# Patient Record
Sex: Female | Born: 1965 | Race: White | Hispanic: No | Smoking: Former smoker
Health system: Southern US, Community
[De-identification: ages and names within clinical notes are randomized; demographics above are authoritative.]

## PROBLEM LIST (undated history)

## (undated) DIAGNOSIS — I2510 Atherosclerotic heart disease of native coronary artery without angina pectoris: Secondary | ICD-10-CM

## (undated) DIAGNOSIS — Z9289 Personal history of other medical treatment: Secondary | ICD-10-CM

## (undated) DIAGNOSIS — E119 Type 2 diabetes mellitus without complications: Secondary | ICD-10-CM

## (undated) DIAGNOSIS — M545 Low back pain, unspecified: Secondary | ICD-10-CM

## (undated) DIAGNOSIS — N289 Disorder of kidney and ureter, unspecified: Secondary | ICD-10-CM

## (undated) DIAGNOSIS — F329 Major depressive disorder, single episode, unspecified: Secondary | ICD-10-CM

## (undated) DIAGNOSIS — E785 Hyperlipidemia, unspecified: Secondary | ICD-10-CM

## (undated) DIAGNOSIS — G8929 Other chronic pain: Secondary | ICD-10-CM

## (undated) DIAGNOSIS — I639 Cerebral infarction, unspecified: Secondary | ICD-10-CM

## (undated) DIAGNOSIS — F319 Bipolar disorder, unspecified: Secondary | ICD-10-CM

## (undated) DIAGNOSIS — F32A Depression, unspecified: Secondary | ICD-10-CM

## (undated) DIAGNOSIS — K3184 Gastroparesis: Secondary | ICD-10-CM

## (undated) DIAGNOSIS — N809 Endometriosis, unspecified: Secondary | ICD-10-CM

## (undated) DIAGNOSIS — Z992 Dependence on renal dialysis: Secondary | ICD-10-CM

## (undated) DIAGNOSIS — R569 Unspecified convulsions: Secondary | ICD-10-CM

## (undated) DIAGNOSIS — I209 Angina pectoris, unspecified: Secondary | ICD-10-CM

## (undated) DIAGNOSIS — I509 Heart failure, unspecified: Secondary | ICD-10-CM

## (undated) DIAGNOSIS — Z8719 Personal history of other diseases of the digestive system: Secondary | ICD-10-CM

## (undated) DIAGNOSIS — I219 Acute myocardial infarction, unspecified: Secondary | ICD-10-CM

## (undated) DIAGNOSIS — I10 Essential (primary) hypertension: Secondary | ICD-10-CM

## (undated) DIAGNOSIS — G43909 Migraine, unspecified, not intractable, without status migrainosus: Secondary | ICD-10-CM

## (undated) DIAGNOSIS — F419 Anxiety disorder, unspecified: Secondary | ICD-10-CM

## (undated) DIAGNOSIS — N186 End stage renal disease: Secondary | ICD-10-CM

## (undated) DIAGNOSIS — K219 Gastro-esophageal reflux disease without esophagitis: Secondary | ICD-10-CM

## (undated) HISTORY — PX: CORONARY ANGIOPLASTY WITH STENT PLACEMENT: SHX49

## (undated) HISTORY — PX: PERITONEAL CATHETER REMOVAL: SUR1106

## (undated) HISTORY — PX: CATARACT EXTRACTION, BILATERAL: SHX1313

## (undated) HISTORY — PX: BELOW KNEE LEG AMPUTATION: SUR23

## (undated) HISTORY — PX: ABDOMINAL HYSTERECTOMY: SHX81

## (undated) HISTORY — PX: HYSTEROTOMY: SHX1776

## (undated) HISTORY — PX: TUBAL LIGATION: SHX77

## (undated) HISTORY — PX: SALPINGOOPHORECTOMY: SHX82

## (undated) HISTORY — DX: Acute myocardial infarction, unspecified: I21.9

---

## 2003-03-23 ENCOUNTER — Other Ambulatory Visit: Payer: Self-pay

## 2004-05-22 ENCOUNTER — Emergency Department: Payer: Self-pay | Admitting: Emergency Medicine

## 2004-05-23 ENCOUNTER — Other Ambulatory Visit: Payer: Self-pay

## 2004-06-15 ENCOUNTER — Ambulatory Visit: Payer: Self-pay

## 2004-08-27 ENCOUNTER — Emergency Department: Payer: Self-pay | Admitting: Emergency Medicine

## 2005-01-09 ENCOUNTER — Emergency Department: Payer: Self-pay | Admitting: Emergency Medicine

## 2005-04-25 ENCOUNTER — Ambulatory Visit: Payer: Self-pay

## 2005-07-18 ENCOUNTER — Emergency Department: Payer: Self-pay | Admitting: Emergency Medicine

## 2005-10-28 ENCOUNTER — Encounter: Payer: Self-pay | Admitting: *Deleted

## 2005-10-30 ENCOUNTER — Ambulatory Visit: Payer: Self-pay | Admitting: Pediatrics

## 2005-10-30 ENCOUNTER — Encounter: Payer: Self-pay | Admitting: *Deleted

## 2005-11-30 ENCOUNTER — Encounter: Payer: Self-pay | Admitting: *Deleted

## 2006-06-11 ENCOUNTER — Other Ambulatory Visit: Payer: Self-pay

## 2006-06-11 ENCOUNTER — Inpatient Hospital Stay: Payer: Self-pay | Admitting: Internal Medicine

## 2007-01-19 ENCOUNTER — Emergency Department: Payer: Self-pay | Admitting: Internal Medicine

## 2009-07-25 ENCOUNTER — Inpatient Hospital Stay: Payer: Self-pay | Admitting: *Deleted

## 2009-09-19 ENCOUNTER — Ambulatory Visit: Payer: Self-pay | Admitting: Family Medicine

## 2009-09-27 ENCOUNTER — Emergency Department: Payer: Self-pay | Admitting: Internal Medicine

## 2009-12-31 ENCOUNTER — Emergency Department: Payer: Self-pay | Admitting: Emergency Medicine

## 2013-06-02 ENCOUNTER — Emergency Department: Payer: Self-pay | Admitting: Emergency Medicine

## 2013-06-02 LAB — COMPREHENSIVE METABOLIC PANEL
ALBUMIN: 2.4 g/dL — AB (ref 3.4–5.0)
Alkaline Phosphatase: 133 U/L — ABNORMAL HIGH
Anion Gap: 9 (ref 7–16)
BILIRUBIN TOTAL: 0.3 mg/dL (ref 0.2–1.0)
BUN: 25 mg/dL — ABNORMAL HIGH (ref 7–18)
Calcium, Total: 8.6 mg/dL (ref 8.5–10.1)
Chloride: 100 mmol/L (ref 98–107)
Co2: 25 mmol/L (ref 21–32)
Creatinine: 2.3 mg/dL — ABNORMAL HIGH (ref 0.60–1.30)
EGFR (Non-African Amer.): 24 — ABNORMAL LOW
GFR CALC AF AMER: 28 — AB
Glucose: 585 mg/dL (ref 65–99)
Osmolality: 300 (ref 275–301)
POTASSIUM: 4.2 mmol/L (ref 3.5–5.1)
SGOT(AST): 19 U/L (ref 15–37)
SGPT (ALT): 17 U/L (ref 12–78)
SODIUM: 134 mmol/L — AB (ref 136–145)
TOTAL PROTEIN: 6.8 g/dL (ref 6.4–8.2)

## 2013-06-02 LAB — CBC WITH DIFFERENTIAL/PLATELET
BASOS ABS: 0.1 10*3/uL (ref 0.0–0.1)
BASOS PCT: 0.8 %
EOS ABS: 0 10*3/uL (ref 0.0–0.7)
Eosinophil %: 0.1 %
HCT: 41 % (ref 35.0–47.0)
HGB: 13.6 g/dL (ref 12.0–16.0)
Lymphocyte #: 1.1 10*3/uL (ref 1.0–3.6)
Lymphocyte %: 14 %
MCH: 27.6 pg (ref 26.0–34.0)
MCHC: 33.2 g/dL (ref 32.0–36.0)
MCV: 83 fL (ref 80–100)
MONO ABS: 0.1 x10 3/mm — AB (ref 0.2–0.9)
MONOS PCT: 2 %
NEUTROS ABS: 6.3 10*3/uL (ref 1.4–6.5)
Neutrophil %: 83.1 %
Platelet: 332 10*3/uL (ref 150–440)
RBC: 4.94 10*6/uL (ref 3.80–5.20)
RDW: 13.8 % (ref 11.5–14.5)
WBC: 7.5 10*3/uL (ref 3.6–11.0)

## 2013-06-02 LAB — TROPONIN I: Troponin-I: <0.02 ng/mL

## 2013-06-02 LAB — LIPASE, BLOOD: Lipase: 102 U/L (ref 73–393)

## 2013-07-03 ENCOUNTER — Inpatient Hospital Stay: Payer: Self-pay | Admitting: Internal Medicine

## 2013-07-03 DIAGNOSIS — N179 Acute kidney failure, unspecified: Secondary | ICD-10-CM

## 2013-07-03 DIAGNOSIS — I5033 Acute on chronic diastolic (congestive) heart failure: Secondary | ICD-10-CM

## 2013-07-03 DIAGNOSIS — N189 Chronic kidney disease, unspecified: Secondary | ICD-10-CM

## 2013-07-03 DIAGNOSIS — I519 Heart disease, unspecified: Secondary | ICD-10-CM

## 2013-07-03 LAB — CBC
HCT: 26 % — ABNORMAL LOW (ref 35.0–47.0)
HGB: 8.7 g/dL — AB (ref 12.0–16.0)
MCH: 28.3 pg (ref 26.0–34.0)
MCHC: 33.3 g/dL (ref 32.0–36.0)
MCV: 85 fL (ref 80–100)
PLATELETS: 287 10*3/uL (ref 150–440)
RBC: 3.06 10*6/uL — ABNORMAL LOW (ref 3.80–5.20)
RDW: 13.8 % (ref 11.5–14.5)
WBC: 10.9 10*3/uL (ref 3.6–11.0)

## 2013-07-03 LAB — BASIC METABOLIC PANEL
Anion Gap: 7 (ref 7–16)
BUN: 51 mg/dL — ABNORMAL HIGH (ref 7–18)
CHLORIDE: 104 mmol/L (ref 98–107)
CO2: 24 mmol/L (ref 21–32)
Calcium, Total: 7.3 mg/dL — ABNORMAL LOW (ref 8.5–10.1)
Creatinine: 3.75 mg/dL — ABNORMAL HIGH (ref 0.60–1.30)
EGFR (African American): 15 — ABNORMAL LOW
EGFR (Non-African Amer.): 13 — ABNORMAL LOW
GLUCOSE: 342 mg/dL — AB (ref 65–99)
OSMOLALITY: 297 (ref 275–301)
POTASSIUM: 4.6 mmol/L (ref 3.5–5.1)
Sodium: 135 mmol/L — ABNORMAL LOW (ref 136–145)

## 2013-07-03 LAB — TROPONIN I
Troponin-I: 0.02 ng/mL
Troponin-I: 0.04 ng/mL

## 2013-07-03 LAB — CK TOTAL AND CKMB (NOT AT ARMC)
CK, TOTAL: 256 U/L — AB
CK, Total: 262 U/L — ABNORMAL HIGH
CK-MB: 1.4 ng/mL (ref 0.5–3.6)
CK-MB: 1.4 ng/mL (ref 0.5–3.6)

## 2013-07-03 LAB — HEMOGLOBIN A1C: HEMOGLOBIN A1C: 11.7 % — AB (ref 4.2–6.3)

## 2013-07-03 LAB — CK-MB: CK-MB: 1.2 ng/mL (ref 0.5–3.6)

## 2013-07-03 LAB — D-DIMER(ARMC): D-Dimer: 594 ng/ml

## 2013-07-03 LAB — PRO B NATRIURETIC PEPTIDE: B-TYPE NATIURETIC PEPTID: 3510 pg/mL — AB (ref 0–125)

## 2013-07-04 DIAGNOSIS — I2510 Atherosclerotic heart disease of native coronary artery without angina pectoris: Secondary | ICD-10-CM

## 2013-07-04 LAB — BASIC METABOLIC PANEL
Anion Gap: 8 (ref 7–16)
BUN: 52 mg/dL — ABNORMAL HIGH (ref 7–18)
Calcium, Total: 7.5 mg/dL — ABNORMAL LOW (ref 8.5–10.1)
Chloride: 102 mmol/L (ref 98–107)
Co2: 25 mmol/L (ref 21–32)
Creatinine: 3.94 mg/dL — ABNORMAL HIGH (ref 0.60–1.30)
GFR CALC AF AMER: 15 — AB
GFR CALC NON AF AMER: 13 — AB
Glucose: 72 mg/dL (ref 65–99)
Osmolality: 283 (ref 275–301)
Potassium: 3.6 mmol/L (ref 3.5–5.1)
Sodium: 135 mmol/L — ABNORMAL LOW (ref 136–145)

## 2013-07-05 LAB — BASIC METABOLIC PANEL
Anion Gap: 6 — ABNORMAL LOW (ref 7–16)
BUN: 65 mg/dL — AB (ref 7–18)
CALCIUM: 7.6 mg/dL — AB (ref 8.5–10.1)
Chloride: 99 mmol/L (ref 98–107)
Co2: 26 mmol/L (ref 21–32)
Creatinine: 4.05 mg/dL — ABNORMAL HIGH (ref 0.60–1.30)
EGFR (African American): 14 — ABNORMAL LOW
GFR CALC NON AF AMER: 12 — AB
GLUCOSE: 148 mg/dL — AB (ref 65–99)
OSMOLALITY: 284 (ref 275–301)
Potassium: 3.9 mmol/L (ref 3.5–5.1)
Sodium: 131 mmol/L — ABNORMAL LOW (ref 136–145)

## 2013-07-05 LAB — HCG, QUANTITATIVE, PREGNANCY: Beta Hcg, Quant.: <1 m[IU]/mL — ABNORMAL LOW

## 2013-07-06 DIAGNOSIS — R079 Chest pain, unspecified: Secondary | ICD-10-CM

## 2013-07-06 DIAGNOSIS — I5033 Acute on chronic diastolic (congestive) heart failure: Secondary | ICD-10-CM

## 2013-07-06 LAB — BASIC METABOLIC PANEL
Anion Gap: 10 (ref 7–16)
BUN: 73 mg/dL — ABNORMAL HIGH (ref 7–18)
CALCIUM: 7.5 mg/dL — AB (ref 8.5–10.1)
CREATININE: 4.53 mg/dL — AB (ref 0.60–1.30)
Chloride: 97 mmol/L — ABNORMAL LOW (ref 98–107)
Co2: 23 mmol/L (ref 21–32)
EGFR (Non-African Amer.): 11 — ABNORMAL LOW
GFR CALC AF AMER: 12 — AB
Glucose: 188 mg/dL — ABNORMAL HIGH (ref 65–99)
Osmolality: 287 (ref 275–301)
Potassium: 4.1 mmol/L (ref 3.5–5.1)
Sodium: 130 mmol/L — ABNORMAL LOW (ref 136–145)

## 2013-07-07 LAB — BASIC METABOLIC PANEL
Anion Gap: 6 — ABNORMAL LOW (ref 7–16)
BUN: 64 mg/dL — ABNORMAL HIGH (ref 7–18)
CALCIUM: 8 mg/dL — AB (ref 8.5–10.1)
CHLORIDE: 98 mmol/L (ref 98–107)
CO2: 27 mmol/L (ref 21–32)
CREATININE: 3.81 mg/dL — AB (ref 0.60–1.30)
GFR CALC AF AMER: 15 — AB
GFR CALC NON AF AMER: 13 — AB
GLUCOSE: 145 mg/dL — AB (ref 65–99)
Osmolality: 284 (ref 275–301)
Potassium: 4.1 mmol/L (ref 3.5–5.1)
Sodium: 131 mmol/L — ABNORMAL LOW (ref 136–145)

## 2013-07-07 LAB — IRON AND TIBC
IRON BIND. CAP.(TOTAL): 203 ug/dL — AB (ref 250–450)
IRON SATURATION: 15 %
Iron: 30 ug/dL — ABNORMAL LOW (ref 50–170)
Unbound Iron-Bind.Cap.: 173 ug/dL

## 2013-07-07 LAB — PLATELET COUNT: Platelet: 355 10*3/uL (ref 150–440)

## 2013-07-07 LAB — PHOSPHORUS: PHOSPHORUS: 6.4 mg/dL — AB (ref 2.5–4.9)

## 2013-07-08 DIAGNOSIS — I5033 Acute on chronic diastolic (congestive) heart failure: Secondary | ICD-10-CM

## 2013-07-08 LAB — BASIC METABOLIC PANEL
ANION GAP: 5 — AB (ref 7–16)
BUN: 51 mg/dL — AB (ref 7–18)
CHLORIDE: 99 mmol/L (ref 98–107)
CO2: 30 mmol/L (ref 21–32)
Calcium, Total: 7.7 mg/dL — ABNORMAL LOW (ref 8.5–10.1)
Creatinine: 3.34 mg/dL — ABNORMAL HIGH (ref 0.60–1.30)
EGFR (African American): 18 — ABNORMAL LOW
EGFR (Non-African Amer.): 15 — ABNORMAL LOW
GLUCOSE: 158 mg/dL — AB (ref 65–99)
Osmolality: 285 (ref 275–301)
POTASSIUM: 3.9 mmol/L (ref 3.5–5.1)
Sodium: 134 mmol/L — ABNORMAL LOW (ref 136–145)

## 2013-07-09 LAB — RENAL FUNCTION PANEL
Albumin: 1.9 g/dL — ABNORMAL LOW (ref 3.4–5.0)
Anion Gap: 6 — ABNORMAL LOW (ref 7–16)
BUN: 58 mg/dL — AB (ref 7–18)
CALCIUM: 7.8 mg/dL — AB (ref 8.5–10.1)
Chloride: 98 mmol/L (ref 98–107)
Co2: 28 mmol/L (ref 21–32)
Creatinine: 3.42 mg/dL — ABNORMAL HIGH (ref 0.60–1.30)
EGFR (African American): 17 — ABNORMAL LOW
EGFR (Non-African Amer.): 15 — ABNORMAL LOW
Glucose: 177 mg/dL — ABNORMAL HIGH (ref 65–99)
OSMOLALITY: 285 (ref 275–301)
Phosphorus: 5.9 mg/dL — ABNORMAL HIGH (ref 2.5–4.9)
Potassium: 4.3 mmol/L (ref 3.5–5.1)
SODIUM: 132 mmol/L — AB (ref 136–145)

## 2013-07-09 LAB — IRON AND TIBC
Iron Bind.Cap.(Total): 213 ug/dL — ABNORMAL LOW (ref 250–450)
Iron Saturation: 14 %
Iron: 30 ug/dL — ABNORMAL LOW (ref 50–170)
Unbound Iron-Bind.Cap.: 183 ug/dL

## 2013-07-09 LAB — HEMOGLOBIN: HGB: 7.4 g/dL — ABNORMAL LOW (ref 12.0–16.0)

## 2013-07-09 LAB — FERRITIN: Ferritin (ARMC): 259 ng/mL (ref 8–388)

## 2013-07-10 LAB — CBC WITH DIFFERENTIAL/PLATELET
BASOS ABS: 0.1 10*3/uL (ref 0.0–0.1)
Basophil %: 0.7 %
EOS PCT: 4 %
Eosinophil #: 0.4 10*3/uL (ref 0.0–0.7)
HCT: 22.2 % — ABNORMAL LOW (ref 35.0–47.0)
HGB: 7.3 g/dL — ABNORMAL LOW (ref 12.0–16.0)
Lymphocyte #: 1.7 10*3/uL (ref 1.0–3.6)
Lymphocyte %: 17.6 %
MCH: 27.6 pg (ref 26.0–34.0)
MCHC: 33.1 g/dL (ref 32.0–36.0)
MCV: 83 fL (ref 80–100)
MONO ABS: 0.8 x10 3/mm (ref 0.2–0.9)
MONOS PCT: 7.9 %
Neutrophil #: 6.8 10*3/uL — ABNORMAL HIGH (ref 1.4–6.5)
Neutrophil %: 69.8 %
Platelet: 462 10*3/uL — ABNORMAL HIGH (ref 150–440)
RBC: 2.66 10*6/uL — AB (ref 3.80–5.20)
RDW: 13.4 % (ref 11.5–14.5)
WBC: 9.7 10*3/uL (ref 3.6–11.0)

## 2013-07-10 LAB — BASIC METABOLIC PANEL
Anion Gap: 6 — ABNORMAL LOW (ref 7–16)
BUN: 67 mg/dL — ABNORMAL HIGH (ref 7–18)
CALCIUM: 8.6 mg/dL (ref 8.5–10.1)
CHLORIDE: 95 mmol/L — AB (ref 98–107)
CREATININE: 3.59 mg/dL — AB (ref 0.60–1.30)
Co2: 29 mmol/L (ref 21–32)
EGFR (Non-African Amer.): 14 — ABNORMAL LOW
GFR CALC AF AMER: 16 — AB
GLUCOSE: 74 mg/dL (ref 65–99)
Osmolality: 279 (ref 275–301)
Potassium: 3.9 mmol/L (ref 3.5–5.1)
Sodium: 130 mmol/L — ABNORMAL LOW (ref 136–145)

## 2013-07-11 LAB — BASIC METABOLIC PANEL
Anion Gap: 9 (ref 7–16)
BUN: 71 mg/dL — AB (ref 7–18)
CREATININE: 3.6 mg/dL — AB (ref 0.60–1.30)
Calcium, Total: 8 mg/dL — ABNORMAL LOW (ref 8.5–10.1)
Chloride: 95 mmol/L — ABNORMAL LOW (ref 98–107)
Co2: 27 mmol/L (ref 21–32)
EGFR (African American): 16 — ABNORMAL LOW
GFR CALC NON AF AMER: 14 — AB
Glucose: 199 mg/dL — ABNORMAL HIGH (ref 65–99)
Osmolality: 289 (ref 275–301)
POTASSIUM: 4.4 mmol/L (ref 3.5–5.1)
Sodium: 131 mmol/L — ABNORMAL LOW (ref 136–145)

## 2013-07-11 LAB — CBC WITH DIFFERENTIAL/PLATELET
Basophil #: 0.1 10*3/uL (ref 0.0–0.1)
Basophil %: 0.8 %
Eosinophil #: 0.3 10*3/uL (ref 0.0–0.7)
Eosinophil %: 3.2 %
HCT: 21.5 % — AB (ref 35.0–47.0)
HGB: 6.5 g/dL — ABNORMAL LOW (ref 12.0–16.0)
LYMPHS PCT: 17.5 %
Lymphocyte #: 1.7 10*3/uL (ref 1.0–3.6)
MCH: 25.6 pg — ABNORMAL LOW (ref 26.0–34.0)
MCHC: 30.2 g/dL — ABNORMAL LOW (ref 32.0–36.0)
MCV: 85 fL (ref 80–100)
MONOS PCT: 8 %
Monocyte #: 0.8 x10 3/mm (ref 0.2–0.9)
Neutrophil #: 6.7 10*3/uL — ABNORMAL HIGH (ref 1.4–6.5)
Neutrophil %: 70.5 %
PLATELETS: 422 10*3/uL (ref 150–440)
RBC: 2.54 10*6/uL — ABNORMAL LOW (ref 3.80–5.20)
RDW: 13.7 % (ref 11.5–14.5)
WBC: 9.4 10*3/uL (ref 3.6–11.0)

## 2013-07-12 LAB — CBC WITH DIFFERENTIAL/PLATELET
Basophil #: 0.1 10*3/uL (ref 0.0–0.1)
Basophil %: 0.7 %
Eosinophil #: 0.3 10*3/uL (ref 0.0–0.7)
Eosinophil %: 3.1 %
HCT: 19.6 % — ABNORMAL LOW (ref 35.0–47.0)
HGB: 6.6 g/dL — ABNORMAL LOW (ref 12.0–16.0)
LYMPHS PCT: 14.7 %
Lymphocyte #: 1.5 10*3/uL (ref 1.0–3.6)
MCH: 28 pg (ref 26.0–34.0)
MCHC: 33.7 g/dL (ref 32.0–36.0)
MCV: 83 fL (ref 80–100)
Monocyte #: 1 x10 3/mm — ABNORMAL HIGH (ref 0.2–0.9)
Monocyte %: 9.6 %
Neutrophil #: 7.3 10*3/uL — ABNORMAL HIGH (ref 1.4–6.5)
Neutrophil %: 71.9 %
PLATELETS: 418 10*3/uL (ref 150–440)
RBC: 2.36 10*6/uL — AB (ref 3.80–5.20)
RDW: 13.4 % (ref 11.5–14.5)
WBC: 10.2 10*3/uL (ref 3.6–11.0)

## 2013-07-12 LAB — BASIC METABOLIC PANEL
ANION GAP: 6 — AB (ref 7–16)
BUN: 80 mg/dL — ABNORMAL HIGH (ref 7–18)
CHLORIDE: 94 mmol/L — AB (ref 98–107)
Calcium, Total: 8.1 mg/dL — ABNORMAL LOW (ref 8.5–10.1)
Co2: 29 mmol/L (ref 21–32)
Creatinine: 3.83 mg/dL — ABNORMAL HIGH (ref 0.60–1.30)
GFR CALC AF AMER: 15 — AB
GFR CALC NON AF AMER: 13 — AB
GLUCOSE: 149 mg/dL — AB (ref 65–99)
Osmolality: 286 (ref 275–301)
POTASSIUM: 4.2 mmol/L (ref 3.5–5.1)
Sodium: 129 mmol/L — ABNORMAL LOW (ref 136–145)

## 2013-07-12 LAB — FERRITIN: Ferritin (ARMC): 183 ng/mL (ref 8–388)

## 2013-07-12 LAB — IRON AND TIBC
IRON SATURATION: 11 %
Iron Bind.Cap.(Total): 214 ug/dL — ABNORMAL LOW (ref 250–450)
Iron: 24 ug/dL — ABNORMAL LOW (ref 50–170)
Unbound Iron-Bind.Cap.: 190 ug/dL

## 2013-07-12 LAB — OCCULT BLOOD X 1 CARD TO LAB, STOOL: Occult Blood, Feces: NEGATIVE

## 2013-07-13 LAB — CBC WITH DIFFERENTIAL/PLATELET
BASOS ABS: 0.1 10*3/uL (ref 0.0–0.1)
BASOS PCT: 0.4 %
Eosinophil #: 0.3 10*3/uL (ref 0.0–0.7)
Eosinophil %: 3 %
HCT: 23.2 % — ABNORMAL LOW (ref 35.0–47.0)
HGB: 7.6 g/dL — ABNORMAL LOW (ref 12.0–16.0)
Lymphocyte #: 1.7 10*3/uL (ref 1.0–3.6)
Lymphocyte %: 14.6 %
MCH: 27.2 pg (ref 26.0–34.0)
MCHC: 32.6 g/dL (ref 32.0–36.0)
MCV: 83 fL (ref 80–100)
MONOS PCT: 8.5 %
Monocyte #: 1 x10 3/mm — ABNORMAL HIGH (ref 0.2–0.9)
NEUTROS PCT: 73.5 %
Neutrophil #: 8.6 10*3/uL — ABNORMAL HIGH (ref 1.4–6.5)
PLATELETS: 437 10*3/uL (ref 150–440)
RBC: 2.79 10*6/uL — AB (ref 3.80–5.20)
RDW: 13.8 % (ref 11.5–14.5)
WBC: 11.7 10*3/uL — ABNORMAL HIGH (ref 3.6–11.0)

## 2013-07-13 LAB — RENAL FUNCTION PANEL
ANION GAP: 9 (ref 7–16)
Albumin: 2.5 g/dL — ABNORMAL LOW (ref 3.4–5.0)
BUN: 87 mg/dL — AB (ref 7–18)
CREATININE: 4.27 mg/dL — AB (ref 0.60–1.30)
Calcium, Total: 8.1 mg/dL — ABNORMAL LOW (ref 8.5–10.1)
Chloride: 93 mmol/L — ABNORMAL LOW (ref 98–107)
Co2: 28 mmol/L (ref 21–32)
EGFR (African American): 13 — ABNORMAL LOW
GFR CALC NON AF AMER: 11 — AB
GLUCOSE: 128 mg/dL — AB (ref 65–99)
OSMOLALITY: 289 (ref 275–301)
POTASSIUM: 4 mmol/L (ref 3.5–5.1)
Phosphorus: 7 mg/dL — ABNORMAL HIGH (ref 2.5–4.9)
Sodium: 130 mmol/L — ABNORMAL LOW (ref 136–145)

## 2013-07-14 LAB — CBC WITH DIFFERENTIAL/PLATELET
Basophil #: 0.1 10*3/uL (ref 0.0–0.1)
Basophil %: 0.7 %
EOS PCT: 3.4 %
Eosinophil #: 0.4 10*3/uL (ref 0.0–0.7)
HCT: 22.6 % — ABNORMAL LOW (ref 35.0–47.0)
HGB: 7.8 g/dL — ABNORMAL LOW (ref 12.0–16.0)
LYMPHS PCT: 16.7 %
Lymphocyte #: 1.8 10*3/uL (ref 1.0–3.6)
MCH: 28.2 pg (ref 26.0–34.0)
MCHC: 34.3 g/dL (ref 32.0–36.0)
MCV: 82 fL (ref 80–100)
Monocyte #: 0.7 x10 3/mm (ref 0.2–0.9)
Monocyte %: 6.4 %
NEUTROS ABS: 7.7 10*3/uL — AB (ref 1.4–6.5)
NEUTROS PCT: 72.8 %
PLATELETS: 419 10*3/uL (ref 150–440)
RBC: 2.75 10*6/uL — ABNORMAL LOW (ref 3.80–5.20)
RDW: 13.7 % (ref 11.5–14.5)
WBC: 10.6 10*3/uL (ref 3.6–11.0)

## 2013-07-14 LAB — BASIC METABOLIC PANEL
Anion Gap: 8 (ref 7–16)
BUN: 95 mg/dL — AB (ref 7–18)
CREATININE: 4.4 mg/dL — AB (ref 0.60–1.30)
Calcium, Total: 8.3 mg/dL — ABNORMAL LOW (ref 8.5–10.1)
Chloride: 92 mmol/L — ABNORMAL LOW (ref 98–107)
Co2: 31 mmol/L (ref 21–32)
EGFR (African American): 13 — ABNORMAL LOW
GFR CALC NON AF AMER: 11 — AB
Glucose: 173 mg/dL — ABNORMAL HIGH (ref 65–99)
OSMOLALITY: 296 (ref 275–301)
Potassium: 3.9 mmol/L (ref 3.5–5.1)
Sodium: 131 mmol/L — ABNORMAL LOW (ref 136–145)

## 2013-07-14 LAB — PHOSPHORUS: PHOSPHORUS: 6.4 mg/dL — AB (ref 2.5–4.9)

## 2013-07-15 DIAGNOSIS — I5033 Acute on chronic diastolic (congestive) heart failure: Secondary | ICD-10-CM

## 2013-07-15 LAB — RENAL FUNCTION PANEL
ALBUMIN: 2.2 g/dL — AB (ref 3.4–5.0)
Anion Gap: 6 — ABNORMAL LOW (ref 7–16)
BUN: 76 mg/dL — ABNORMAL HIGH (ref 7–18)
CALCIUM: 8.3 mg/dL — AB (ref 8.5–10.1)
CO2: 33 mmol/L — AB (ref 21–32)
CREATININE: 3.43 mg/dL — AB (ref 0.60–1.30)
Chloride: 94 mmol/L — ABNORMAL LOW (ref 98–107)
EGFR (Non-African Amer.): 15 — ABNORMAL LOW
GFR CALC AF AMER: 17 — AB
Glucose: 202 mg/dL — ABNORMAL HIGH (ref 65–99)
OSMOLALITY: 295 (ref 275–301)
PHOSPHORUS: 5.2 mg/dL — AB (ref 2.5–4.9)
POTASSIUM: 3.4 mmol/L — AB (ref 3.5–5.1)
Sodium: 133 mmol/L — ABNORMAL LOW (ref 136–145)

## 2013-07-15 LAB — PHOSPHORUS: Phosphorus: 5.4 mg/dL — ABNORMAL HIGH (ref 2.5–4.9)

## 2013-07-15 LAB — HEMOGLOBIN: HGB: 7.5 g/dL — ABNORMAL LOW (ref 12.0–16.0)

## 2013-07-16 ENCOUNTER — Encounter: Payer: Self-pay | Admitting: Internal Medicine

## 2013-07-16 LAB — HEMOGLOBIN: HGB: 7.3 g/dL — ABNORMAL LOW (ref 12.0–16.0)

## 2013-07-16 LAB — PHOSPHORUS: Phosphorus: 3.3 mg/dL (ref 2.5–4.9)

## 2013-07-17 LAB — PHOSPHORUS: Phosphorus: 3.3 mg/dL (ref 2.5–4.9)

## 2013-07-20 LAB — HEMOGLOBIN A1C: Hemoglobin A1C: 9.1 % — ABNORMAL HIGH (ref 4.2–6.3)

## 2013-07-30 ENCOUNTER — Encounter: Payer: Self-pay | Admitting: Internal Medicine

## 2013-10-05 ENCOUNTER — Emergency Department: Payer: Self-pay | Admitting: Emergency Medicine

## 2013-10-05 LAB — URINALYSIS, COMPLETE
Bilirubin,UR: NEGATIVE
Blood: NEGATIVE
Leukocyte Esterase: NEGATIVE
Nitrite: NEGATIVE
Ph: 7 (ref 4.5–8.0)
Protein: >=500
RBC,UR: 3 /HPF (ref 0–5)
SPECIFIC GRAVITY: 1.02 (ref 1.003–1.030)
Squamous Epithelial: 10
WBC UR: 8 /HPF (ref 0–5)

## 2013-10-05 LAB — COMPREHENSIVE METABOLIC PANEL
ALT: 14 U/L (ref 12–78)
AST: 24 U/L (ref 15–37)
Albumin: 2.2 g/dL — ABNORMAL LOW (ref 3.4–5.0)
Alkaline Phosphatase: 150 U/L — ABNORMAL HIGH
Anion Gap: 10 (ref 7–16)
BILIRUBIN TOTAL: 0.3 mg/dL (ref 0.2–1.0)
BUN: 30 mg/dL — ABNORMAL HIGH (ref 7–18)
CALCIUM: 9.3 mg/dL (ref 8.5–10.1)
CREATININE: 2.9 mg/dL — AB (ref 0.60–1.30)
Chloride: 102 mmol/L (ref 98–107)
Co2: 24 mmol/L (ref 21–32)
EGFR (African American): 21 — ABNORMAL LOW
EGFR (Non-African Amer.): 18 — ABNORMAL LOW
GLUCOSE: 418 mg/dL — AB (ref 65–99)
Osmolality: 296 (ref 275–301)
POTASSIUM: 4.2 mmol/L (ref 3.5–5.1)
Sodium: 136 mmol/L (ref 136–145)
TOTAL PROTEIN: 7.4 g/dL (ref 6.4–8.2)

## 2013-10-05 LAB — CBC WITH DIFFERENTIAL/PLATELET
Basophil #: 0.1 10*3/uL (ref 0.0–0.1)
Basophil %: 0.7 %
EOS PCT: 0.8 %
Eosinophil #: 0.1 10*3/uL (ref 0.0–0.7)
HCT: 41.7 % (ref 35.0–47.0)
HGB: 13.8 g/dL (ref 12.0–16.0)
LYMPHS PCT: 9.6 %
Lymphocyte #: 0.9 10*3/uL — ABNORMAL LOW (ref 1.0–3.6)
MCH: 27.2 pg (ref 26.0–34.0)
MCHC: 33.2 g/dL (ref 32.0–36.0)
MCV: 82 fL (ref 80–100)
Monocyte #: 0.2 x10 3/mm (ref 0.2–0.9)
Monocyte %: 2.6 %
NEUTROS PCT: 86.3 %
Neutrophil #: 8.1 10*3/uL — ABNORMAL HIGH (ref 1.4–6.5)
Platelet: 272 10*3/uL (ref 150–440)
RBC: 5.09 10*6/uL (ref 3.80–5.20)
RDW: 14.7 % — ABNORMAL HIGH (ref 11.5–14.5)
WBC: 9.4 10*3/uL (ref 3.6–11.0)

## 2013-10-05 LAB — LIPASE, BLOOD: Lipase: 135 U/L (ref 73–393)

## 2013-10-05 LAB — TSH: Thyroid Stimulating Horm: 4.03 u[IU]/mL

## 2013-10-07 LAB — URINE CULTURE

## 2013-10-10 LAB — CULTURE, BLOOD (SINGLE)

## 2013-10-26 LAB — COMPREHENSIVE METABOLIC PANEL
ALK PHOS: 126 U/L — AB
Albumin: 1.7 g/dL — ABNORMAL LOW (ref 3.4–5.0)
Anion Gap: 13 (ref 7–16)
BILIRUBIN TOTAL: 0.2 mg/dL (ref 0.2–1.0)
BUN: 27 mg/dL — ABNORMAL HIGH (ref 7–18)
CALCIUM: 9.6 mg/dL (ref 8.5–10.1)
CO2: 26 mmol/L (ref 21–32)
CREATININE: 3.15 mg/dL — AB (ref 0.60–1.30)
Chloride: 100 mmol/L (ref 98–107)
EGFR (African American): 19 — ABNORMAL LOW
EGFR (Non-African Amer.): 16 — ABNORMAL LOW
GLUCOSE: 356 mg/dL — AB (ref 65–99)
Osmolality: 297 (ref 275–301)
POTASSIUM: 3.3 mmol/L — AB (ref 3.5–5.1)
SGOT(AST): 14 U/L — ABNORMAL LOW (ref 15–37)
SGPT (ALT): 12 U/L — ABNORMAL LOW
Sodium: 139 mmol/L (ref 136–145)
Total Protein: 5.7 g/dL — ABNORMAL LOW (ref 6.4–8.2)

## 2013-10-26 LAB — CBC
HCT: 42.3 % (ref 35.0–47.0)
HGB: 13.6 g/dL (ref 12.0–16.0)
MCH: 26.3 pg (ref 26.0–34.0)
MCHC: 32.1 g/dL (ref 32.0–36.0)
MCV: 82 fL (ref 80–100)
Platelet: 288 10*3/uL (ref 150–440)
RBC: 5.16 10*6/uL (ref 3.80–5.20)
RDW: 14.7 % — AB (ref 11.5–14.5)
WBC: 8.3 10*3/uL (ref 3.6–11.0)

## 2013-10-26 LAB — URINALYSIS, COMPLETE
Bacteria: NONE SEEN
Bilirubin,UR: NEGATIVE
Blood: NEGATIVE
Hyaline Cast: 4
Leukocyte Esterase: NEGATIVE
Nitrite: NEGATIVE
Ph: 6 (ref 4.5–8.0)
Protein: >=500
RBC,UR: 2 /HPF (ref 0–5)
Specific Gravity: 1.022 (ref 1.003–1.030)
WBC UR: 8 /HPF (ref 0–5)

## 2013-10-26 LAB — TROPONIN I
TROPONIN-I: 0.17 ng/mL — AB
TROPONIN-I: 0.13 ng/mL — AB
TROPONIN-I: 0.09 ng/mL — AB

## 2013-10-26 LAB — PROTIME-INR
INR: 0.9
Prothrombin Time: 12.5 secs (ref 11.5–14.7)

## 2013-10-26 LAB — CK-MB: CK-MB: 5.9 ng/mL — ABNORMAL HIGH (ref 0.5–3.6)

## 2013-10-26 LAB — LIPASE, BLOOD: LIPASE: 95 U/L (ref 73–393)

## 2013-10-26 LAB — PHOSPHORUS: Phosphorus: 4.7 mg/dL (ref 2.5–4.9)

## 2013-10-26 LAB — APTT: ACTIVATED PTT: 32 s (ref 23.6–35.9)

## 2013-10-27 ENCOUNTER — Inpatient Hospital Stay: Payer: Self-pay | Admitting: Internal Medicine

## 2013-10-27 LAB — BASIC METABOLIC PANEL
ANION GAP: 8 (ref 7–16)
BUN: 17 mg/dL (ref 7–18)
CALCIUM: 8 mg/dL — AB (ref 8.5–10.1)
CHLORIDE: 98 mmol/L (ref 98–107)
CO2: 30 mmol/L (ref 21–32)
CREATININE: 3.41 mg/dL — AB (ref 0.60–1.30)
EGFR (African American): 17 — ABNORMAL LOW
EGFR (Non-African Amer.): 15 — ABNORMAL LOW
GLUCOSE: 252 mg/dL — AB (ref 65–99)
Osmolality: 282 (ref 275–301)
POTASSIUM: 3.4 mmol/L — AB (ref 3.5–5.1)
Sodium: 136 mmol/L (ref 136–145)

## 2013-10-27 LAB — CBC WITH DIFFERENTIAL/PLATELET
BASOS PCT: 0.4 %
Basophil #: 0 10*3/uL (ref 0.0–0.1)
Eosinophil #: 0.1 10*3/uL (ref 0.0–0.7)
Eosinophil %: 0.9 %
HCT: 34.2 % — ABNORMAL LOW (ref 35.0–47.0)
HGB: 11 g/dL — AB (ref 12.0–16.0)
LYMPHS PCT: 17.7 %
Lymphocyte #: 2.2 10*3/uL (ref 1.0–3.6)
MCH: 26.4 pg (ref 26.0–34.0)
MCHC: 32.2 g/dL (ref 32.0–36.0)
MCV: 82 fL (ref 80–100)
MONOS PCT: 5.4 %
Monocyte #: 0.7 x10 3/mm (ref 0.2–0.9)
Neutrophil #: 9.5 10*3/uL — ABNORMAL HIGH (ref 1.4–6.5)
Neutrophil %: 75.6 %
Platelet: 237 10*3/uL (ref 150–440)
RBC: 4.17 10*6/uL (ref 3.80–5.20)
RDW: 14.8 % — AB (ref 11.5–14.5)
WBC: 12.6 10*3/uL — AB (ref 3.6–11.0)

## 2013-10-27 LAB — URINE CULTURE

## 2013-10-27 LAB — MAGNESIUM: MAGNESIUM: 1.7 mg/dL — AB

## 2013-10-27 LAB — GENTAMICIN LEVEL, PEAK: GENTAMICIN, PEAK: 8 ug/mL (ref 4.0–8.0)

## 2013-10-28 LAB — CBC WITH DIFFERENTIAL/PLATELET
Basophil #: 0 10*3/uL (ref 0.0–0.1)
Basophil %: 0.5 %
EOS ABS: 0.2 10*3/uL (ref 0.0–0.7)
Eosinophil %: 2.3 %
HCT: 31 % — ABNORMAL LOW (ref 35.0–47.0)
HGB: 10 g/dL — ABNORMAL LOW (ref 12.0–16.0)
LYMPHS PCT: 22.3 %
Lymphocyte #: 1.9 10*3/uL (ref 1.0–3.6)
MCH: 26.6 pg (ref 26.0–34.0)
MCHC: 32.1 g/dL (ref 32.0–36.0)
MCV: 83 fL (ref 80–100)
Monocyte #: 0.7 x10 3/mm (ref 0.2–0.9)
Monocyte %: 8.5 %
NEUTROS PCT: 66.4 %
Neutrophil #: 5.8 10*3/uL (ref 1.4–6.5)
Platelet: 197 10*3/uL (ref 150–440)
RBC: 3.74 10*6/uL — ABNORMAL LOW (ref 3.80–5.20)
RDW: 14.8 % — ABNORMAL HIGH (ref 11.5–14.5)
WBC: 8.7 10*3/uL (ref 3.6–11.0)

## 2013-10-28 LAB — BODY FLUID CELL COUNT WITH DIFFERENTIAL
BASOS ABS: 0 %
EOS PCT: 0 %
Lymphocytes: 12 %
NEUTROS PCT: 85 %
Nucleated Cell Count: 158 /mm3
OTHER CELLS BF: 0 %
OTHER MONONUCLEAR CELLS: 3 %

## 2013-10-28 LAB — RENAL FUNCTION PANEL
ANION GAP: 9 (ref 7–16)
Albumin: 1.4 g/dL — ABNORMAL LOW (ref 3.4–5.0)
BUN: 27 mg/dL — ABNORMAL HIGH (ref 7–18)
CHLORIDE: 94 mmol/L — AB (ref 98–107)
CO2: 29 mmol/L (ref 21–32)
Calcium, Total: 7.5 mg/dL — ABNORMAL LOW (ref 8.5–10.1)
Creatinine: 4.67 mg/dL — ABNORMAL HIGH (ref 0.60–1.30)
EGFR (African American): 12 — ABNORMAL LOW
GFR CALC NON AF AMER: 10 — AB
Glucose: 294 mg/dL — ABNORMAL HIGH (ref 65–99)
Osmolality: 280 (ref 275–301)
Phosphorus: 3.6 mg/dL (ref 2.5–4.9)
Potassium: 3.3 mmol/L — ABNORMAL LOW (ref 3.5–5.1)
Sodium: 132 mmol/L — ABNORMAL LOW (ref 136–145)

## 2013-10-28 LAB — GENTAMICIN LEVEL, TROUGH
GENTAMICIN, TROUGH: 3.9 ug/mL — AB (ref 0.0–2.0)
Gentamicin, Trough: 1.6 ug/mL (ref 0.0–2.0)

## 2013-10-31 LAB — CULTURE, BLOOD (SINGLE)

## 2013-11-01 LAB — BODY FLUID CULTURE

## 2013-11-03 ENCOUNTER — Ambulatory Visit: Payer: Self-pay | Admitting: Vascular Surgery

## 2013-11-03 HISTORY — PX: PERITONEAL CATHETER INSERTION: SHX2223

## 2013-11-04 ENCOUNTER — Emergency Department: Payer: Self-pay | Admitting: Emergency Medicine

## 2013-11-13 ENCOUNTER — Emergency Department: Payer: Self-pay | Admitting: Student

## 2013-11-13 LAB — TROPONIN I: Troponin-I: 0.02 ng/mL

## 2013-11-13 LAB — COMPREHENSIVE METABOLIC PANEL WITH GFR
Albumin: 1.8 g/dL — ABNORMAL LOW
Alkaline Phosphatase: 123 U/L — ABNORMAL HIGH
Anion Gap: 13
BUN: 12 mg/dL
Bilirubin,Total: 0.4 mg/dL
Calcium, Total: 7.9 mg/dL — ABNORMAL LOW
Chloride: 101 mmol/L
Co2: 23 mmol/L
Creatinine: 2.71 mg/dL — ABNORMAL HIGH
EGFR (African American): 23 — ABNORMAL LOW
EGFR (Non-African Amer.): 20 — ABNORMAL LOW
Glucose: 318 mg/dL — ABNORMAL HIGH
Osmolality: 286
Potassium: 3.3 mmol/L — ABNORMAL LOW
SGOT(AST): 31 U/L
SGPT (ALT): 15 U/L
Sodium: 137 mmol/L
Total Protein: 6 g/dL — ABNORMAL LOW

## 2013-11-13 LAB — CBC
HCT: 39.4 % (ref 35.0–47.0)
HGB: 12.9 g/dL (ref 12.0–16.0)
MCH: 27 pg (ref 26.0–34.0)
MCHC: 32.6 g/dL (ref 32.0–36.0)
MCV: 83 fL (ref 80–100)
PLATELETS: 338 10*3/uL (ref 150–440)
RBC: 4.77 10*6/uL (ref 3.80–5.20)
RDW: 15.6 % — AB (ref 11.5–14.5)
WBC: 10.7 10*3/uL (ref 3.6–11.0)

## 2013-11-13 LAB — LIPASE, BLOOD: Lipase: 85 U/L

## 2013-12-08 ENCOUNTER — Emergency Department: Payer: Self-pay | Admitting: Emergency Medicine

## 2013-12-08 LAB — COMPREHENSIVE METABOLIC PANEL
ALBUMIN: 2.2 g/dL — AB (ref 3.4–5.0)
Alkaline Phosphatase: 129 U/L — ABNORMAL HIGH
Anion Gap: 8 (ref 7–16)
BILIRUBIN TOTAL: 0.2 mg/dL (ref 0.2–1.0)
BUN: 26 mg/dL — ABNORMAL HIGH (ref 7–18)
Calcium, Total: 8.5 mg/dL (ref 8.5–10.1)
Chloride: 103 mmol/L (ref 98–107)
Co2: 27 mmol/L (ref 21–32)
Creatinine: 2.93 mg/dL — ABNORMAL HIGH (ref 0.60–1.30)
EGFR (African American): 21 — ABNORMAL LOW
GFR CALC NON AF AMER: 18 — AB
GLUCOSE: 259 mg/dL — AB (ref 65–99)
Osmolality: 289 (ref 275–301)
Potassium: 4.6 mmol/L (ref 3.5–5.1)
SGOT(AST): 22 U/L (ref 15–37)
SGPT (ALT): 14 U/L
Sodium: 138 mmol/L (ref 136–145)
Total Protein: 6.3 g/dL — ABNORMAL LOW (ref 6.4–8.2)

## 2013-12-08 LAB — CBC
HCT: 37.8 % (ref 35.0–47.0)
HGB: 12 g/dL (ref 12.0–16.0)
MCH: 27.5 pg (ref 26.0–34.0)
MCHC: 31.8 g/dL — ABNORMAL LOW (ref 32.0–36.0)
MCV: 87 fL (ref 80–100)
Platelet: 320 10*3/uL (ref 150–440)
RBC: 4.37 10*6/uL (ref 3.80–5.20)
RDW: 16.3 % — ABNORMAL HIGH (ref 11.5–14.5)
WBC: 8.2 10*3/uL (ref 3.6–11.0)

## 2013-12-08 LAB — LIPASE, BLOOD: Lipase: 78 U/L (ref 73–393)

## 2013-12-31 LAB — COMPREHENSIVE METABOLIC PANEL
ALBUMIN: 2.4 g/dL — AB (ref 3.4–5.0)
ANION GAP: 8 (ref 7–16)
Alkaline Phosphatase: 133 U/L — ABNORMAL HIGH
BILIRUBIN TOTAL: 0.2 mg/dL (ref 0.2–1.0)
BUN: 57 mg/dL — ABNORMAL HIGH (ref 7–18)
CALCIUM: 8.5 mg/dL (ref 8.5–10.1)
CO2: 26 mmol/L (ref 21–32)
Chloride: 104 mmol/L (ref 98–107)
Creatinine: 4.12 mg/dL — ABNORMAL HIGH (ref 0.60–1.30)
GFR CALC AF AMER: 15 — AB
GFR CALC NON AF AMER: 12 — AB
GLUCOSE: 255 mg/dL — AB (ref 65–99)
Osmolality: 300 (ref 275–301)
POTASSIUM: 4.6 mmol/L (ref 3.5–5.1)
SGOT(AST): 27 U/L (ref 15–37)
SGPT (ALT): 31 U/L
SODIUM: 138 mmol/L (ref 136–145)
Total Protein: 5.8 g/dL — ABNORMAL LOW (ref 6.4–8.2)

## 2013-12-31 LAB — CBC WITH DIFFERENTIAL/PLATELET
BASOS ABS: 0.1 10*3/uL (ref 0.0–0.1)
BASOS PCT: 0.8 %
EOS ABS: 0.3 10*3/uL (ref 0.0–0.7)
EOS PCT: 3.1 %
HCT: 34.9 % — ABNORMAL LOW (ref 35.0–47.0)
HGB: 11.1 g/dL — AB (ref 12.0–16.0)
Lymphocyte #: 2.2 10*3/uL (ref 1.0–3.6)
Lymphocyte %: 25.4 %
MCH: 27.8 pg (ref 26.0–34.0)
MCHC: 31.9 g/dL — ABNORMAL LOW (ref 32.0–36.0)
MCV: 87 fL (ref 80–100)
MONOS PCT: 6.5 %
Monocyte #: 0.6 x10 3/mm (ref 0.2–0.9)
NEUTROS PCT: 64.2 %
Neutrophil #: 5.5 10*3/uL (ref 1.4–6.5)
PLATELETS: 317 10*3/uL (ref 150–440)
RBC: 4.01 10*6/uL (ref 3.80–5.20)
RDW: 14.8 % — AB (ref 11.5–14.5)
WBC: 8.5 10*3/uL (ref 3.6–11.0)

## 2013-12-31 LAB — URINALYSIS, COMPLETE
Bilirubin,UR: NEGATIVE
Blood: NEGATIVE
Glucose,UR: 150 mg/dL (ref 0–75)
KETONE: NEGATIVE
LEUKOCYTE ESTERASE: NEGATIVE
NITRITE: NEGATIVE
Ph: 6 (ref 4.5–8.0)
RBC,UR: <1 /HPF (ref 0–5)
Specific Gravity: 1.009 (ref 1.003–1.030)
Squamous Epithelial: 1

## 2013-12-31 LAB — TROPONIN I
Troponin-I: <0.02 ng/mL
Troponin-I: <0.02 ng/mL

## 2013-12-31 LAB — TSH: Thyroid Stimulating Horm: 2.25 u[IU]/mL

## 2013-12-31 LAB — PRO B NATRIURETIC PEPTIDE: B-Type Natriuretic Peptide: 486 pg/mL — ABNORMAL HIGH (ref 0–125)

## 2014-01-01 ENCOUNTER — Inpatient Hospital Stay: Payer: Self-pay | Admitting: Internal Medicine

## 2014-01-01 LAB — PHOSPHORUS: Phosphorus: 6.5 mg/dL — ABNORMAL HIGH (ref 2.5–4.9)

## 2014-01-01 LAB — TROPONIN I: Troponin-I: <0.02 ng/mL

## 2014-01-02 LAB — BASIC METABOLIC PANEL
ANION GAP: 6 — AB (ref 7–16)
BUN: 32 mg/dL — ABNORMAL HIGH (ref 7–18)
CREATININE: 2.67 mg/dL — AB (ref 0.60–1.30)
Calcium, Total: 7.8 mg/dL — ABNORMAL LOW (ref 8.5–10.1)
Chloride: 102 mmol/L (ref 98–107)
Co2: 31 mmol/L (ref 21–32)
EGFR (African American): 24 — ABNORMAL LOW
EGFR (Non-African Amer.): 20 — ABNORMAL LOW
Glucose: 191 mg/dL — ABNORMAL HIGH (ref 65–99)
Osmolality: 290 (ref 275–301)
Potassium: 4.4 mmol/L (ref 3.5–5.1)
Sodium: 139 mmol/L (ref 136–145)

## 2014-02-11 ENCOUNTER — Ambulatory Visit: Payer: Self-pay | Admitting: Vascular Surgery

## 2014-07-16 ENCOUNTER — Inpatient Hospital Stay
Admit: 2014-07-16 | Disposition: A | Discharge status: Home / Self Care | Payer: Self-pay | Attending: Internal Medicine | Admitting: Internal Medicine

## 2014-07-16 LAB — COMPREHENSIVE METABOLIC PANEL
ALBUMIN: 2.6 g/dL — AB
ANION GAP: 10 (ref 7–16)
Alkaline Phosphatase: 104 U/L
BUN: 31 mg/dL — AB
Bilirubin,Total: 0.4 mg/dL
CHLORIDE: 102 mmol/L
CREATININE: 3.3 mg/dL — AB
Calcium, Total: 8.4 mg/dL — ABNORMAL LOW
Co2: 21 mmol/L — ABNORMAL LOW
EGFR (Non-African Amer.): 15 — ABNORMAL LOW
GFR CALC AF AMER: 18 — AB
Glucose: 471 mg/dL — ABNORMAL HIGH
POTASSIUM: 4.4 mmol/L
SGOT(AST): 14 U/L — ABNORMAL LOW
SGPT (ALT): 9 U/L — ABNORMAL LOW
Sodium: 133 mmol/L — ABNORMAL LOW
TOTAL PROTEIN: 5.7 g/dL — AB

## 2014-07-16 LAB — CBC WITH DIFFERENTIAL/PLATELET
Basophil #: 0.1 10*3/uL (ref 0.0–0.1)
Basophil %: 0.5 %
Eosinophil #: 0.1 10*3/uL (ref 0.0–0.7)
Eosinophil %: 0.4 %
HCT: 37.9 % (ref 35.0–47.0)
HGB: 12.3 g/dL (ref 12.0–16.0)
LYMPHS ABS: 1.3 10*3/uL (ref 1.0–3.6)
Lymphocyte %: 11.6 %
MCH: 27.8 pg (ref 26.0–34.0)
MCHC: 32.5 g/dL (ref 32.0–36.0)
MCV: 86 fL (ref 80–100)
MONO ABS: 0.3 x10 3/mm (ref 0.2–0.9)
Monocyte %: 2.4 %
NEUTROS ABS: 9.8 10*3/uL — AB (ref 1.4–6.5)
NEUTROS PCT: 85.1 %
PLATELETS: 334 10*3/uL (ref 150–440)
RBC: 4.42 10*6/uL (ref 3.80–5.20)
RDW: 13 % (ref 11.5–14.5)
WBC: 11.5 10*3/uL — ABNORMAL HIGH (ref 3.6–11.0)

## 2014-07-16 LAB — LIPASE, BLOOD: LIPASE: 27 U/L

## 2014-07-16 LAB — TROPONIN I: Troponin-I: 0.03 ng/mL

## 2014-07-17 LAB — BASIC METABOLIC PANEL
Anion Gap: 7 (ref 7–16)
BUN: 32 mg/dL — ABNORMAL HIGH
CO2: 26 mmol/L
CREATININE: 3.45 mg/dL — AB
Calcium, Total: 8.4 mg/dL — ABNORMAL LOW
Chloride: 103 mmol/L
GFR CALC AF AMER: 17 — AB
GFR CALC NON AF AMER: 15 — AB
GLUCOSE: 221 mg/dL — AB
Potassium: 3.5 mmol/L
SODIUM: 136 mmol/L

## 2014-07-17 LAB — BODY FLUID CELL COUNT WITH DIFFERENTIAL
Basophil: 0 %
EOS PCT: 4 %
Lymphocytes: 69 %
NEUTROS PCT: 1 %
Nucleated Cell Count: 30 /mm3
Other Cells BF: 0 %
Other Mononuclear Cells: 26 %

## 2014-07-17 LAB — CBC WITH DIFFERENTIAL/PLATELET
BASOS ABS: 0.1 10*3/uL (ref 0.0–0.1)
Basophil %: 0.7 %
EOS PCT: 1.2 %
Eosinophil #: 0.1 10*3/uL (ref 0.0–0.7)
HCT: 35.2 % (ref 35.0–47.0)
HGB: 11.5 g/dL — ABNORMAL LOW (ref 12.0–16.0)
LYMPHS ABS: 3 10*3/uL (ref 1.0–3.6)
LYMPHS PCT: 29.3 %
MCH: 27.9 pg (ref 26.0–34.0)
MCHC: 32.8 g/dL (ref 32.0–36.0)
MCV: 85 fL (ref 80–100)
MONOS PCT: 5.5 %
Monocyte #: 0.6 x10 3/mm (ref 0.2–0.9)
NEUTROS PCT: 63.3 %
Neutrophil #: 6.4 10*3/uL (ref 1.4–6.5)
PLATELETS: 316 10*3/uL (ref 150–440)
RBC: 4.13 10*6/uL (ref 3.80–5.20)
RDW: 13 % (ref 11.5–14.5)
WBC: 10.1 10*3/uL (ref 3.6–11.0)

## 2014-07-17 LAB — HEMOGLOBIN A1C: Hemoglobin A1C: 13.8 % — ABNORMAL HIGH

## 2014-07-21 LAB — CULTURE, BLOOD (SINGLE)

## 2014-07-21 LAB — WOUND CULTURE

## 2014-07-21 LAB — BODY FLUID CULTURE

## 2014-07-23 NOTE — Op Note (Signed)
PATIENT NAME:  Kimberly Montgomery, Kimberly Montgomery MR#:  [id number] DATE OF BIRTH:  Aug 19, 1965  DATE OF PROCEDURE:  07/15/2013  PREOPERATIVE DIAGNOSES: 1. End-stage renal disease.  2. Morbid obesity.  3. Hypertension.   POSTOPERATIVE DIAGNOSES:  1. End-stage renal disease.  2. Morbid obesity.  3. Hypertension.   PROCEDURE: Placement of right jugular PermCath with fluoroscopic guidance using previous venous access.   SURGEON: Annice Needy, M.D.   ANESTHESIA: Local with sedation.   BLOOD LOSS: 50 mL.   FLUOROSCOPY TIME: About two minutes.   CONTRAST USED: None.   INDICATION FOR PROCEDURE: A 49 year old female who was admitted to the hospital for acute renal failure on top of chronic kidney disease. Her renal function has not recovered and she has now progressed to end-stage renal failure. We are placing a dialysis catheter in a tunneled fashion so that she can discharged from the hospital and have this going forward. We will evaluate her later for a fistula or graft as an outpatient.   DESCRIPTION OF PROCEDURE: The patient is brought to the vascular suite. The right neck and chest and existing catheter were sterilely prepped and draped and a sterile surgical field was created. An Amplatz Super Stiff wire was placed through the existing Trialysis-type dialysis catheter in the right jugular vein and this catheter was then removed. The delivery sheath was then placed. The peel-away sheath and placed over the wire. A counterincision made in the right subclavicular region, we tunneled from the subclavicular site to the jugular access site. Initially, a catheter was placed with a significant kink which could not be repositioned and kept in an adequate location. I then replaced the wire and used a slightly longer catheter and tunneled over the wire from the subclavicular incision through the access site with the peel-away sheath to avoid a kink. This was successful. We parked the catheter tip in the right atrium with a  23 cm tip to cuff tunneled hemodialysis catheter. Both lumens withdrew blood well and flushed easily with heparinized saline and a concentrated heparin solution was then placed. At this time, there were no kinks within the catheter. It was secured to the chest wall with 2 Prolene sutures. The jugular access incision was closed with a 4-0 Monocryl. A 4-0 Monocryl pursestring suture was placed around the exit site. Sterile dressing was placed. The patient tolerated the procedure well and was taken to the recovery room in stable condition.    DATE OF PROCEDURE: Thank you    ____________________________ Annice Needy, MD jsd:sg D: 07/15/2013 12:25:41 ET T: 07/15/2013 13:07:24 ET JOB#: [id number]  cc: Annice Needy, MD, <Dictator> Annice Needy MD ELECTRONICALLY SIGNED 07/15/2013 15:30

## 2014-07-23 NOTE — Op Note (Signed)
PATIENT NAME:  Kimberly Montgomery, Kimberly Montgomery MR#:  [id number] DATE OF BIRTH:  03/05/1966  DATE OF PROCEDURE:  02/11/2014  PREOPERATIVE DIAGNOSES: 1.  Complication of dialysis device with thrombosis of tunneled catheter.  2.  Stage III renal insufficiency.   POSTOPERATIVE DIAGNOSES:   1.  Complication of dialysis device with thrombosis of tunneled catheter.  2.  Stage III renal insufficiency.   PROCEDURE PERFORMED: Removal of tunneled catheter.   SURGEON:  Renford Dills, MD   DESCRIPTION OF PROCEDURE: The patient is in the preoperative holding area. She is positioned supine and the catheter and chest wall on the right are prepped and draped in a sterile fashion. Then 1% lidocaine is infiltrated in the soft tissues surrounding the cuff, which is approximately 1 cm from the exit site. Once appropriate anesthesia has been achieved a small incision is made at the exit site to extend this a little bit and then with traction distally, the cuff is exposed. The surrounding adhesions are lysed with the 11 blade and scissors and subsequently the catheter is removed without difficulty. Pressure is held at the base of the neck for approximately 5 minutes. Bacitracin ointment and a bandage are placed over the exit site. The patient tolerated the procedure well and there were no immediate complications.    ____________________________ Renford Dills, MD ggs:nt D: 02/11/2014 14:44:11 ET T: 02/11/2014 18:52:16 ET JOB#: [id number]  cc: Renford Dills, MD, <Dictator> Renford Dills MD ELECTRONICALLY SIGNED 03/01/2014 12:59

## 2014-07-23 NOTE — Consult Note (Signed)
PATIENT NAME:  Kimberly Montgomery, Kimberly Montgomery MR#:  [id number] DATE OF BIRTH:  Apr 08, 1965  DATE OF CONSULTATION:  07/06/2013  REFERRING PHYSICIAN:  Gladstone Lighter, MD CONSULTING PHYSICIAN:  Delaina Fetsch Lilian Kapur, MD  REASON FOR CONSULTATION: Evaluation and management of acute renal failure in the setting of chronic kidney disease stage IV with potential need for hemodialysis.   HISTORY OF PRESENT ILLNESS: The patient is a very pleasant 49 year old Caucasian female with past medical history of coronary artery disease status post stent placement, chronic diastolic heart failure, chronic kidney disease stage IV with baseline eGFR of 21, diabetes mellitus, peripheral neuropathy, hypertension, hyperlipidemia, morbid obesity, and depression who presented to [facility] on July 03, 2013 with complaints of shortness of breath. The patient states that she has been involved in wedding preparation for one of her best friends. She was quite tired on Friday evening. She woke up early Saturday morning with significant shortness of breath. She then also developed chest pain with numbness in the left upper extremity. Given these symptoms she presented to [facility] for further evaluation. She is being actively followed by cardiology for her chest pain and underlying coronary artery disease. We are asked to see the patient now for worsening acute renal failure in the setting of known chronic kidney disease, stage IV. The patient is followed by Dr. Smith Mince of [facility] nephrology. On June 02, 2013, the patient's creatinine was 2.3 with an eGFR of 24. The patient reports that her most recent outpatient eGFR was 21. When she presented this time creatinine was 3.75 with an eGFR of 13. Unfortunately, her renal function has deteriorated with diuresis and creatinine today is 4.53 with an eGFR of 11. She has been diuresing well, but does remain short of breath. She is also having dysacusia, nausea and poor  appetite. We are asked to see the patient to evaluate her for need of dialysis.   PAST MEDICAL HISTORY: 1.  Coronary artery disease status post stent placement.  2.  Chronic diastolic heart failure.  3.  Chronic kidney disease stage IV with baseline eGFR of 21, followed by [facility] nephrology, Dr. Smith Mince.  4.  Diabetes mellitus.  5.  Peripheral neuropathy.  6.  Hypertension.  7.  Hyperlipidemia.  8.  Obesity.  9.  Depression.   PAST SURGICAL HISTORY: 1.  Tubal ligation.  2.  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   ALLERGIES: KEFLEX, PENICILLIN, SHELLFISH.  CURRENT INPATIENT MEDICATIONS: Include:  1.  Acetaminophen 650 mg p.o. every 4 hours p.r.n.  2.  Amlodipine 10 mg p.o. daily.  3.  Aspirin 81 mg daily.  4.  Wellbutrin 150 mg p.o. daily.  5.  Wellbutrin SR 300 mg p.o. daily.  6.  Plavix 75 mg p.o. daily.  7.  Colace 100 mg p.o. b.i.d.  8.  Zetia 10 mg p.o. daily. 9.  Ferrous sulfate 325 mg p.o. daily.  10.  Flonase 2 sprays both nostrils daily.  11.  Gabapentin 600 mg p.o. t.i.d.  12.  Heparin 5000 units subcu q. 8 hours.  13.  Hydralazine 100 mg p.o. t.i.d.  14.  Sliding scale insulin.  15.  Imdur 30 mg p.o. daily.  16.  Reglan 5 mg p.o. q.i.d.  17.  Metoprolol 100 mg p.o. daily.  18.  Morphine 2 mg IV every 4 hours p.r.n. pain.  19.  Nicotine patch 14 mg transdermal daily.  20.  Zofran 4 mg IV q. 4 hours p.r.n.  21.  Protonix  40 mg p.o. b.i.d.  22.  Zocor 40 mg p.o. at bedtime.  23.  Ultram 50 mg p.o. q. 6 hours p.r.n.  24.  MiraLax 17 grams p.o. daily p.r.n. constipation.  25.  Lasix 40 mg p.o. b.i.d.  26.  Levemir 55 units subcutaneous at bedtime.   SOCIAL HISTORY: The patient resides in [place]. She is widowed. She recently lost her husband. She has 2 children. She has active tobacco abuse and smokes 1/2 pack of cigarettes per day. Denies alcohol or illicit drug use.   FAMILY HISTORY: The patient's father is living and has history of coronary  artery disease status post CABG. The patient's mother is deceased and also had history of underlying coronary artery disease.   REVIEW OF SYSTEMS: CONSTITUTIONAL: Denies fevers and chills, but does have malaise.  EYES: Denies diplopia, blurry vision.  HEENT: Denies headaches or hearing loss. Denies epistaxis.  CARDIOVASCULAR: Upon presentation, had chest pains which radiated into the left arm.  RESPIRATORY: Endorses shortness of breath and cough, but denies hemoptysis.  GASTROINTESTINAL: Endorses nausea and vomiting and poor appetite. Denies bloody stools.  GENITOURINARY: Denies frequency, urgency, or dysuria.  MUSCULOSKELETAL: Denies joint pain, swelling or redness.  INTEGUMENTARY: Denies skin rashes or lesions.  NEUROLOGIC: Has history of peripheral neuropathy.  PSYCHIATRIC: Has history of depression.  ENDOCRINE: Has history of diabetes mellitus.  HEMATOLOGIC AND LYMPHATIC: Denies easy bruisability, bleeding, or swollen lymph nodes but does have anemia of chronic kidney disease.  ALLERGY AND IMMUNOLOGIC: Has history of seasonal allergies.   PHYSICAL EXAMINATION: VITAL SIGNS: Temperature 98.2, pulse 68, respirations 20, blood pressure 127/74, pulse ox 91% on 1 liter.  GENERAL: Well-developed, well-nourished, obese female, currently in no acute distress.  HEENT: Normocephalic, atraumatic. Extraocular movements are intact. Pupils equal, round, and reactive to light. No scleral icterus. Conjunctivae are pink. No epistaxis noted. Gross hearing intact. Oral mucosa moist.  NECK: Supple without JVD or lymphadenopathy.  LUNGS: Demonstrate bibasilar rales with normal respiratory effort.  HEART: S1, S2 regular rate and rhythm. No murmurs, rubs, or gallops appreciated.  ABDOMEN: Obese, soft, nontender, nondistended. Bowel sounds positive. No rebound or guarding. No gross organomegaly appreciated.  EXTREMITIES: Right below-the-knee amputation noted. Trace edema noted in the left lower extremity.   NEUROLOGIC: The patient is awake, alert and oriented to time, person, and place. Strength is 5/5 in both upper and lower extremities.  GENITOURINARY: No suprapubic tenderness is noted at this time.  MUSCULOSKELETAL: No joint redness, swelling or tenderness appreciated.  PSYCHIATRIC: The patient with appropriate affect and appears to have good insight into her current illness.   DIAGNOSTIC DATA: Laboratory: Sodium 138, potassium 4.1, chloride 97, CO2 23, BUN 73, creatinine 4.5, glucose 188. EGFR 11. CBC shows hemoglobin 8.7, hematocrit 26.  A 2-D echocardiogram shows an ejection fraction of 60% TO 65%. There is moderately increased left ventricular septal thickness. This showed a normal pattern of LV diastolic filling.   ASSESSMENT AND PLAN: This is a 49 year old Caucasian female with past medical history of coronary artery disease status post stent placement, chronic diastolic heart failure, chronic kidney disease stage IV with baseline eGFR of 21, diabetes mellitus, peripheral neuropathy, hypertension, hyperlipidemia, morbid obesity, and depression who presented to [facility] with shortness of breath felt to be secondary to acute diastolic heart failure. 1.  Acute renal failure/chronic kidney disease, stage IV. The patient's baseline eGFR is 21 and she is regularly followed by [facility] nephrology. She has had some discussions with Dr. Smith Mince regarding the  need for renal replacement therapy. She has had worsening renal function over the course of this hospitalization after diuresis. She is having some uremic symptoms at this point in time and could potentially benefit from ongoing volume removal. Therefore, we feel it is appropriate to proceed with dialysis therapy at present. We will request consultation with [facility] for placement of temporary hemodialysis catheter. We will start with dialysis for 1-1/2 hours today with a blood flow rate of 150 and dialysate flow rate  of 300. Ultrafiltration target today will be 0.5 kg. We will plan for dialysis tomorrow as well.  2.  Acute diastolic heart failure. The patient was still found to have some basilar rales on exam today. As above, we will be starting dialysis and performing 0.5 kg of ultrafiltration today.  3.  Anemia of chronic kidney disease. Hemoglobin was noted to be a bit low today at 8.7. We will check serum iron studies and consider Epogen if hemoglobin remains low.  4.  Hypertension. Blood pressure appears to be well controlled at present. The patient will continue on amlodipine, Imdur, hydralazine and metoprolol.   I would like to thank Dr. Tressia Miners for this kind referral. Further plan as the patient progresses.  ____________________________ Tama High, MD mnl:sb D: 07/06/2013 14:12:59 ET T: 07/06/2013 14:58:50 ET JOB#: [id number]  cc: Tama High, MD, <Dictator> Tama High MD ELECTRONICALLY SIGNED 07/26/2013 15:37

## 2014-07-23 NOTE — Op Note (Signed)
PATIENT NAME:  Kimberly Montgomery, KUEHLER MR#:  [id number] DATE OF BIRTH:  01-27-1966  DATE OF PROCEDURE:  07/06/2013  PREOPERATIVE DIAGNOSIS: Acute on chronic renal insufficiency.   POSTOPERATIVE DIAGNOSIS:  Acute on chronic renal insufficiency.  PROCEDURE PERFORMED: Insertion of right IJ temporary dialysis catheter with ultrasound guidance.   SURGEON: Levora Dredge, M.D.   DESCRIPTION OF PROCEDURE: The patient is in her hospital bed. She is positioned supine and the right neck is prepped and draped in a sterile fashion. Ultrasound is placed in a sterile sleeve. Ultrasound is utilized secondary to appropriate landmarks and to avoid vascular injury. Under direct ultrasound visualization, jugular vein is identified. It is echolucent and compressible indicating patency. Image is recorded for the permanent record. Under direct visualization, 1% lidocaine is infiltrated in the soft tissues and then a micropuncture needle is inserted directly into the jugular vein. Microwire is advanced, followed by microsheath. Counterincision is created. J-wire is advanced, followed by the dilator, and the 20 cm Trialysis temporary dialysis catheter. All 3 lumens aspirate and flush easily. The catheter is secured to the skin of the neck with 2-0 nylon, and a sterile dressing is applied. The patient tolerated the procedure well. Chest x-ray is pending.    ____________________________ Renford Dills, MD ggs:dmm D: 07/06/2013 20:59:38 ET T: 07/06/2013 22:14:40 ET JOB#: [id number]  cc: Renford Dills, MD, <Dictator> Renford Dills MD ELECTRONICALLY SIGNED 07/20/2013 11:18

## 2014-07-23 NOTE — Op Note (Signed)
PATIENT NAME:  Kimberly Montgomery, Kimberly Montgomery MR#:  [id number] DATE OF BIRTH:  [birth year]  DATE OF PROCEDURE:  11/03/2013  PREOPERATIVE DIAGNOSES:  1. Complication of dialysis device with nonfunction of a peritoneal catheter.  2. End-stage renal disease requiring hemodialysis.  3. Morbid obesity.  4. Peripheral vascular disease.  POSTOPERATIVE DIAGNOSES: 1. Complication of dialysis device with nonfunction of a peritoneal catheter.  2. End-stage renal disease requiring hemodialysis.  3. Morbid obesity.  4. Peripheral vascular disease.  PROCEDURE PERFORMED:  1. Laparoscopic insertion of peritoneal dialysis catheter.  2. Removal of existing peritoneal dialysis catheter.   SURGEON: Dr. Lorretta Harp.   ANESTHESIA: General by endotracheal intubation.   FLUIDS: Per anesthesia record.   ESTIMATED BLOOD LOSS: Minimal.   SPECIMEN: Photograph of the old catheter is performed to be included in the chart.   INDICATIONS: Kimberly Montgomery is a 49 year old woman who presented with nonfunction of her peritoneal catheter. She is therefore undergoing evaluation, possible revision, and/or replacement as needed. Risks and benefits have been reviewed. All questions answered. The patient agrees to proceed.   DESCRIPTION OF PROCEDURE: The patient is taken to the operating room and placed in the supine position. After adequate general anesthesia is induced, appropriate invasive monitors are placed, she is positioned supine and her abdomen and existing catheter are prepped and draped in a sterile fashion.   A vertical incision is made just above the umbilicus in the midline and the dissection is carried down. The existing catheter can be appreciated just slightly to the right lateral of the umbilicus. This confirms that the peritoneal catheter enters the peritoneal space from a supraumbilical position.   Once the midline of the fascia is identified, pursestring suture of 0 Vicryl is placed. Two sutures are used, and then the  midline is incised with a 15 blade. It is grasped with Allis clamps, and the peritoneal cavity is entered under direct visualization. A Hasson is then placed and the camera is introduced after insufflation with CO2. Imaging of the catheter itself shows that it is involved in a very large amount of omentum, that the catheter is not functioning because it is no longer freely within the peritoneal space, but has been essentially encapsulated from its entrance all the way down to its distal aspect in omentum and adhesions. Given the location of this catheter entering at the supraumbilical space as well as the extensive amount of adhesions, I did not believe this catheter is salvageable. The patient does note that the catheter stopped working within 2 weeks after it was placed. I therefore elected to remove this catheter and place a new one. Right lower quadrant is inspected. It appears to be free of significant adhesions and the omentum is now adhesed to the anterior abdominal wall in what is almost the midline. By external palpation, an entrance site for the new catheter is selected. A curvilinear incision is created in the skin and the dissection is carried down through the soft tissues to expose the fascia. A pursestring suture of 0 Vicryl is then placed and under direct visualization a Seldinger needle is inserted. A J-wire is then advanced followed by the dilator and peel-away sheath. The dilator and wire are removed, and a peritoneal dialysis catheter is advanced over a stylet, through the peel-away sheath. The peel-away sheath is removed and the catheter is positioned deeply within the pelvis under direct visualization. The abdomen is then relieved of the CO2 gas and while this is happening the camera is used to observe that  the peritoneal catheter does not shift or move. The Jen Mow are then used to spread the fascia at the insertion site and the distal cuff is passed underneath the anterior fascia. It is then  secured with the 0 Vicryl.   The catheter is then pulled subcutaneously to exit in the right upper quadrant approximately 8-10 cm lateral to the previous site; 240 mL of sterile saline is instilled and 200 is returned. Therefore, the titanium connector is applied and the extension with the Betadine cap is flushed with heparinized saline and the cap is secured.   The Hasson trocar is then removed and the fascial defect secured with the 0 Vicryl sutures. Working through this same midline incision, the old peritoneal catheter is exposed. The cuffs are dissected. A pursestring suture of 0 Vicryl is placed around the fascia and the catheter is then removed. It is photographed on the back table for the record. The 0 Vicryl is then used to secure the fascial defect from the catheter. All of both wounds are then irrigated and both wounds, the right lower quadrant wound as well as the midline supraumbilical wound, are closed in multiple layers using 3-0 Vicryl, followed by 4-0 Monocryl subcuticular and then Dermabond. Sterile dressing is applied. The patient tolerated the procedure well. There were no immediate complications, and she is taken to the recovery area in good condition.    ____________________________ Kimberly Dills, MD ggs:lt D: 11/03/2013 17:27:57 ET T: 11/03/2013 21:26:57 ET JOB#: [id number]  cc: Kimberly Dills, MD, <Dictator> Kimberly Dills MD ELECTRONICALLY SIGNED 11/09/2013 17:17

## 2014-07-23 NOTE — Consult Note (Signed)
General Aspect Kimberly Montgomery is a 49 yo with a long hx of poorly controlled diabetes.  she has developed CAD ( s/p stenting at [facility]) , PVD ( s/p right BKA), diabetic nephropathy.  she also has HTN, hyperlipidemia.  she has been eating lots of catered foods recently - has been getting ready for a friends wedding this weekend.  she notes swelling of her lower legs, thighs, abdomen over the past several days.  she has also noticed a marked decrease in her urine output.  she has had some atypical CP as well - sharp , stabbing pain - not similar to her angian pain.   she was admitted early this am with acute CHF and renal insufficiency   Present Illness denies any excess salt intake but she has not been eating her own food this week.  takes motrin only rarely.  her glucose levels have not been well controlled.  her medical doctor recentl increased her lantus insulin.   Physical Exam:  GEN well developed, well nourished, obese   HEENT red conjunctivae, hearing intact to voice, moist oral mucosa   NECK No masses   RESP normal resp effort  clear BS   CARD Regular rate and rhythm  Normal, S1, S2   ABD ? ascites vs. obesity.  + abdominal wall edema   LYMPH negative neck   EXTR positive edema, in left lower leg, both thighs.  s/p right BKA   SKIN normal to palpation   PSYCH alert   Review of Systems:  General: Weight loss or gain   Skin: No Complaints   ENT: No Complaints   Eyes: No Complaints   Respiratory: Short of breath   Cardiovascular: Chest pain or discomfort   Genitourinary: urine output has decreased recently   Review of Systems: All other systems were reviewed and found to be negative     Other, see comments: osteomylitis in feet   cardiac stents:    Endometriosis:    nodules on throid:    Asthma:    RBKA:    Other, see comments: pic line   Hysterectomy - Partial:   Home Medications: Medication Instructions Status  Zofran ODT 8 mg oral tablet,  disintegrating 1 tab(s) orally every 4 hours, As Needed - for Nausea, Vomiting Active  fluticasone nasal 50 mcg/inh nasal spray 1 spray(s) nasal once a day Active  buPROPion 150 mg/12 hours oral tablet, extended release 2 tabs (361m) orally in morning and 1 tab orally once a day in the afternoon. Active  hydrALAZINE 100 mg oral tablet 1 tab(s) orally 3 times a day Active  furosemide 40 mg oral tablet 2 tab(s) orally 2 times a day Active  omeprazole 40 mg oral delayed release capsule 1 cap(s) orally 2 times a day Active  Vitamin D2 50,000 intl units oral capsule 1 cap(s) orally once a month for 3 more months Active  amLODIPine 10 mg oral tablet 1 tab(s) orally once a day Active  Senna Lax 8.6 mg oral tablet 1 tab(s) orally 2 times a day, As Needed Active  gabapentin 300 mg oral capsule 2 cap(s) orally 3 times a day Active  Metoprolol Succinate ER 100 mg oral tablet, extended release 1 tab(s) orally once a day Active  metolazone 5 mg oral tablet 1 tab(s) orally once a day Active  Vytorin 10 mg-40 mg oral tablet 1 tab(s) orally once a day Active  isosorbide mononitrate extended release 30 mg oral tablet, extended release 1 tab(s) orally once a  day (in the morning) Active  aspirin 81 mg oral tablet 1 tab(s) orally once a day Active  acetaminophen-oxyCODONE 325 mg-5 mg oral tablet 1 tab(s) orally every 4 hours, As Needed - for Pain Active  traMADol 50 mg oral tablet 1 tab(s) orally every 6 hours, As Needed - for Pain Active  clopidogrel 75 mg oral tablet 1 tab(s) orally once a day Active  ibuprofen 600 mg oral tablet 1 tab(s) orally every 6 hours, As Needed - for Pain Active  ferrous sulfate 325 mg (65 mg elemental iron) oral enteric coated tablet 1  orally once a day Active  Lantus 100 units/mL subcutaneous solution 65 unit(s) subcutaneous once a day (at bedtime) Active  NovoLOG 100 units/mL subcutaneous solution use as directed subcutaneous as needed. sliding scale Active  Colace 100 mg oral  capsule 1 cap(s) orally 2 times a day, As Needed - for Constipation Active   Lab Results: LabObservation:  04-Apr-15 12:20   OBSERVATION Reason for Test  Routine Chem:  04-Apr-15 04:40   Hemoglobin A1c ([facility])  11.7 (The American Diabetes Association recommends that a primary goal of therapy should be <7% and that physicians should reevaluate the treatment regimen in patients with HbA1c values consistently >8%.)  Glucose, Serum  342  BUN  51  Creatinine (comp)  3.75  Sodium, Serum  135  Potassium, Serum 4.6  Chloride, Serum 104  CO2, Serum 24  Calcium (Total), Serum  7.3  Anion Gap 7  Osmolality (calc) 297  eGFR (African American)  15  eGFR (Non-African American)  13 (eGFR values <1m/min/1.73 m2 may be an indication of chronic kidney disease (CKD). Calculated eGFR is useful in patients with stable renal function. The eGFR calculation will not be reliable in acutely ill patients when serum creatinine is changing rapidly. It is not useful in  patients on dialysis. The eGFR calculation may not be applicable to patients at the low and high extremes of body sizes, pregnant women, and vegetarians.)  B-Type Natriuretic Peptide ([facility]  3510 (Result(s) reported on 03 Jul 2013 at 06:01AM.)  Cardiac:  04-Apr-15 04:40   Troponin I < 0.02 (0.00-0.05 0.05 ng/mL or less: NEGATIVE  Repeat testing in 3-6 hrs  if clinically indicated. >0.05 ng/mL: POTENTIAL  MYOCARDIAL INJURY. Repeat  testing in 3-6 hrs if  clinically indicated. NOTE: An increase or decrease  of 30% or more on serial  testing suggests a  clinically important change)    10:16   Troponin I 0.02 (0.00-0.05 0.05 ng/mL or less: NEGATIVE  Repeat testing in 3-6 hrs  if clinically indicated. >0.05 ng/mL: POTENTIAL  MYOCARDIAL INJURY. Repeat  testing in 3-6 hrs if  clinically indicated. NOTE: An increase or decrease  of 30% or more on serial  testing suggests a  clinically important change)  CPK-MB, Serum 1.2  (Result(s) reported on 03 Jul 2013 at 10:40AM.)    14:12   Troponin I 0.04 (0.00-0.05 0.05 ng/mL or less: NEGATIVE  Repeat testing in 3-6 hrs  if clinically indicated. >0.05 ng/mL: POTENTIAL  MYOCARDIAL INJURY. Repeat  testing in 3-6 hrs if  clinically indicated. NOTE: An increase or decrease  of 30% or more on serial  testing suggests a  clinically important change)  CK, Total  256 (26-192 NOTE: NEW REFERENCE RANGE  05/03/2013)  CPK-MB, Serum 1.4 (Result(s) reported on 03 Jul 2013 at 02:44PM.)  Routine Coag:  04-Apr-15 04:40   D-Dimer, Quantitative 594 (INTERPRETATION <> Exclusion of Venous Thromboembolism (VTE) - OUTPATIENT ONLY       (  Emergency Department or [place])             0-499 ng/ml (FEU)  : With a low to intermediate pretest                                  probability for VTE this test result                                  excludes the diagnosis of VTE.             > 499 ng/ml (FEU)  : VTE not excluded; additional work up                                  for VTE is required. <> Testing on Inpatients and Evaluation of Disseminated Intravascular        Coagulation ([facility]IC)             Reference Range:  0-499 ng/ml (FEU))  Routine Hem:  04-Apr-15 04:40   WBC (CBC) 10.9  RBC (CBC)  3.06  Hemoglobin (CBC)  8.7  Hematocrit (CBC)  26.0  Platelet Count (CBC) 287 (Result(s) reported on 03 Jul 2013 at [facility].)  MCV 85  MCH 28.3  MCHC 33.3  RDW 13.8   Radiology Results: XRay:    04-Apr-15 05:12, Chest Portable Single View  Chest Portable Single View   REASON FOR EXAM:    chest pain  COMMENTS:       PROCEDURE: DXR - DXR PORTABLE CHEST SINGLE VIEW  - Jul 03 2013  5:12AM     CLINICAL DATA:  Chest pain, cough and shortness of breath.    EXAM:  PORTABLE CHEST - 1 VIEW    COMPARISON:  Chest radiograph and CTA of the chest performed  07/25/2009    FINDINGS:  The lungs are well-aerated. Mild vascular congestion is noted. Mild  bibasilar airspace opacities likely  reflect atelectasis. There is no  evidence of pleural effusion or pneumothorax.    The cardiomediastinal silhouette is mildly enlarged. No acute  osseous abnormalities are seen.     IMPRESSION:  Mild vascular congestion and mild cardiomegaly noted. Mild bibasilar  airspace opacities likely reflect atelectasis.      Electronically Signed  By: Garald Balding M.D.    On: 07/03/2013 05:34     Verified By: JEFFREY . CHANG, M.D.,    Penicillin: Unknown  Keflex: Unknown  Shellfish: Unknown  Vital Signs/Nurse's Notes: **Vital Signs.:   04-Apr-15 10:45  Vital Signs Type Admission  Temperature Temperature (F) 98.2  Celsius 36.7  Temperature Source oral  Pulse Pulse 75  Respirations Respirations 18  Systolic BP Systolic BP 737  Diastolic BP (mmHg) Diastolic BP (mmHg) 75  Mean BP 100  Pulse Ox % Pulse Ox % 94  Pulse Ox Activity Level  At rest  Oxygen Delivery 2L  *Intake and Output.:   04-Apr-15 10:45  Weight Type admission  Weight Method Bed  Current Weight (lbs) (lbs) 288.9  Current Weight (kg) (kg) 131  Height Type stated  Height (ft) (feet) 5  Height (in) (in) 9  Height (cm) centimeters 175.2  BSA (m2) 2.4  BMI (kg/m2) 42.6    13:36  Grand Totals Intake:   Output:  300    Net:  -  [place].:  -300  Urine ml     Out:  300  Urinary Method  Void; Up to [facility]    Shift 15:00  [facility] Intake:   Output:  300    Net:  -300 24 Hr.:  -300  Urine ml     Out:  300  Length of Stay Totals Intake:   Output:  300    Net:  -300    Impression 1. Acute on chronic volume overload: likely due to a combination of acute on chronic diastolic CHF and worsening renal function.  her GFR has decreased to 13.  her urine output has greatly decreased recently.  she has an elevated BNP ( 3510) but this would be elevated given her renal insuffiencicy.     Troponin levels are nagative x 2.  I doubt this is due to an acute coronary plaque rupture.   Agree with echo.  she will need  aggressive diuresis.  She is likely to need dialysis - probably sooner than later.  if her Troponin remains negative, I would not pursue any further evaluation of her coronaries at this time.   2. Acute on Chronic  kidney disease:  plans per Int. Med.  3. Diabetes mellitus:  poorly controlled at this point .  4. Hypertnesion:  5. Hyperlipidemia:   Electronic Signatures: Nahser, Dreama Saa (MD)  (Signed 04-Apr-15 16:06)  Authored: General Aspect/Present Illness, History and Physical Exam, Review of System, Past Medical History, Home Medications, Labs, Radiology, Allergies, Vital Signs/Nurse's Notes, Impression/Plan   Last Updated: 04-Apr-15 16:06 by Nahser, Dreama Saa (MD)

## 2014-07-23 NOTE — Discharge Summary (Signed)
PATIENT NAME:  Kimberly Montgomery, Kimberly Montgomery MR#:  [id number] DATE OF BIRTH:  1965-10-01  DATE OF ADMISSION:  01/01/2014 DATE OF DISCHARGE:  01/02/2014  DISCHARGE DIAGNOSES:  1.  Trouble breathing secondary to fluid overload from inadequate dialysis.  2.  Hypertension.  3.  Coronary artery disease with stent placement.  4.  Hypertension.  5.  End-stage renal disease.  6.  Type 2 diabetes mellitus with peripheral vascular disease and right above knee amputation.  DISCHARGE MEDICATIONS:  1.  Fluticasone nasal spray 50 mcg 1 spray in each nostril daily. 2.  Hydralazine 100 mg p.o. t.i.d.  3.  Omeprazole 40 mg p.o. b.i.d.  4.  Calcium acetate 667 mg 2 capsules t.i.d.  5.  Amlodipine 10 mg p.o. daily.  6.  Bupropion 150 mg p.o. daily. 7.  Colace 100 mg p.o. b.i.d.  8.  Lantus 45 units once a day.  9.  Metoprolol tartrate 25 mg p.o. daily.  10.  NovoLog subcutaneous t.i.d. as per sliding scale.  11.  Torsemide 100 mg p.o. daily.  12.  Aspirin 81 mg p.o. daily. 13.  Senna 1 tablet p.o. b.i.d.  14.  Plavix 75 mg p.o. daily.  15.  Tramadol 50 mg p.o. every 6 hours as needed for pain.  16.  Vytorin 10/40 mg p.o. daily.  17.  MiraLax 16 grams p.o. b.i.d. as needed for constipation.  18.  Neurontin 300 mg p.o. t.i.d.  19.  Metolazone 5 mg p.o. 1 tablet as needed for swelling.  20.  Bupropion XR 150 mg 2 tablets in the morning.   CONSULTATIONS: Nephrology consult with Dr. Thedore Mins.   HOSPITAL COURSE: The patient is a 49 year old female patient with ESRD on hemodialysis before now, he is a transitioning to peritoneal dialysis, comes in because of weight gain of 10 lbs  in 3 days and associated with trouble breathing, unable to lie down flat for 3 days. The patient usually gets dialysis on Tuesday, Thursday and Saturday, and the patient's last hemodialysis was on Tuesday. The patient has peritoneal dialysis on Thursday, but she continued to feel short of breath and chest discomfort or chest pressure. Admitted to  medical service for observation status for chest pain evaluation. She has a history of coronary artery disease and a stent placement. She is monitored on telemetry. Her troponins have been negative x 3, but because of her trouble breathing, orthopnea and weight gain, I consulted nephrology, Dr. Thedore Mins. The patient was seen by them and started on dialysis. She got hemodialysis yesterday and 2500 mL of fluid was remote. The patient tolerated the dialysis well and Dr. Thedore Mins saw her today and he said the patient can be discharged home and have peritoneal dialysis tomorrow and continue hemodialysis as needed. The patient right now is getting her dialysis with [facility] on Tuesday, Thursday and Saturday, and Dr. Thedore Mins recommended that she was brought back for peritoneal dialysis and follow with [facility] as an outpatient as needed. The patient understands that and she feels much better today after dialysis yesterday and she is being discharged and she will follow up with hemodialysis as needed. The patient's blood pressure is 158/83, heart rate is 72, saturation is 91% on 2 liters and temperature 98.1. The patient says that she does feel like she has sleep apnea, told her that she can follow up with her primary doctor regarding sleep studies and probably getting a CPAP machine.   TIME SPENT: More than 30 minutes.    ____________________________ Katha Hamming, MD  sk:TT D: 01/02/2014 13:53:58 ET T: 01/02/2014 19:23:46 ET JOB#: [id number]  cc: Katha Hamming, MD, <Dictator> Katha Hamming MD ELECTRONICALLY SIGNED 01/22/2014 18:31

## 2014-07-23 NOTE — H&P (Signed)
PATIENT NAME:  Kimberly Montgomery, Kimberly Montgomery MR#:  [id number] DATE OF BIRTH:  05/31/1965  DATE OF ADMISSION:  10/26/2013  REFERRING PHYSICIAN: Dr. Chiquita Loth.  PRIMARY CARE PHYSICIAN: [facility].  PRIMARY NEPHROLOGY: Dr. Lorrene Reid from [facility].   CHIEF COMPLAINT: Abdominal pain, nausea, vomiting.   HISTORY OF PRESENT ILLNESS: This is a 49 year old female with known history of end stage renal dialysis, with recent diagnosis started on hemodialysis Tuesday, Thursday, Saturday. As well, the patient has peritoneal dialysis catheter in anticipation of peritoneal hemodialysis, history of coronary artery disease, right leg BKA, hypertension, diabetes, congestive heart failure. The patient presents with complaints of abdominal pain, nausea, vomiting for 1 day. The patient reports these symptoms have been mild over the last couple of days but worsened over the last 24 hours. The patient reports she was diagnosed with peritonitis/intraabdominal infection as she had peritoneal culture, was positive. The patient cannot recall which species, but reports she has been on IV vancomycin for peritonitis over the last 1 month. She denies any fever, but reports chills. The patient was afebrile here, had no leukocytosis. No nausea, vomiting here in the Emergency Department. A CT abdomen and pelvis did not show any acute findings. The patient had blood cultures sent, and started on IV vancomycin and levofloxacin in the ED, and hospitalist requested to admit the patient. The patient had recently ED visit on July 7th for similar complaints with negative CAT scan where she was discharged home as well. The patient had been on Cipro recently for UTI. Urinalysis has been negative. The patient denies any chest pain, any shortness of breath, any dysuria or polyuria, any coffee-ground emesis or bright red blood per rectum or diarrhea or constipation.   PAST MEDICAL HISTORY:  1. Coronary artery disease, status post stent.  2. Congestive  heart failure.  3. End-stage renal disease on hemodialysis.  4. Diabetes mellitus.  5. Diabetic neuropathy.  6. Hypertension.  7. Hyperlipidemia.  8. Obesity.  9. Depression.   PAST SURGICAL HISTORY:  1. Tubal ligation.  2. Right leg BKA for diabetic foot ulcer.  3. Removal of fallopian tube and also ovary secondary to endometriosis.   ALLERGIES: KEFLEX, PENICILLIN, SHELLFISH, AND CEPHALOSPORIN.   SOCIAL HISTORY: Lives at home, continues to smoke half pack per day. No alcohol. No illicit drug use.   FAMILY HISTORY: Significant for coronary artery disease and COPD in the family.   HOME MEDICATIONS:  1. Aspirin 81 mg oral daily.  2. Vytorin 10/40 one tablet oral daily. 3. Insulin 50 units Lantus at bedtime and NovoLog sliding scale.  4. Reglan 10 mg oral as needed before meals.  5. Norvasc 5 mg oral daily.  6. Torsemide 20 mg 4 tablets 2 times a day.  7. Colace 100 mg oral 2 times a day.  8. MiraLax as needed.  9. Senna as needed.  10. Calcium acetate 667 two tablets 3 times a day.  11. Omeprazole 40 mg 2 times a day.  12. Wellbutrin SR 300 mg oral 2 times a day  13. Hydralazine 100 mg oral 3 times a day.  14. Phenergan as needed.   REVIEW OF SYSTEMS:   GENERAL: Denies weight gain, weight loss. Reports fatigue, weakness, chills.  EYES: Denies blurry vision, double vision, inflammation. ENT: Denies tinnitus, ear pain, hearing loss, epistaxis. RESPIRATORY: Denies cough, wheezing, hemoptysis.  CARDIOVASCULAR: Denies chest pain, edema, arrhythmia.  GASTROINTESTINAL: Reports nausea, vomiting, abdominal pain. Denies diarrhea, coffee-ground emesis, melena.  GENITOURINARY: Denies dysuria, hematuria, renal colic.  ENDOCRINE: Denies polyuria, polydipsia, heat or cold intolerance.  HEMATOLOGY: Denies easy bruising, bleeding diathesis.  INTEGUMENT: Denies acne, rash, or skin lesion.  MUSCULOSKELETAL: Denies any swelling, gout, cramps.  NEUROLOGIC: Denies CVA, TIA, vertigo, tremor.   PSYCHIATRIC: Denies anxiety, insomnia. Reports history of depression.  PHYSICAL EXAMINATION:  VITAL SIGNS: Temperature 98.7, pulse 108, respiratory rate 18, blood pressure 212/101, saturating 95% on room air.  GENERAL: Well-nourished female, looks comfortable in bed, in no apparent distress.  HEENT: Head atraumatic, normocephalic. Pupils are equal and reactive to light. Pink conjunctivae. Anicteric sclerae. Moist oral mucosa.  NECK: Supple. No thyromegaly. No JVD.  CHEST: Had good air entry bilaterally. No wheezing, rales, rhonchi. Has right upper chest PermCath.  CARDIOVASCULAR: S1, S2 heard. No rubs, murmurs, gallops.  ABDOMEN: Has mild tenderness to palpation in the epigastric area. No rebound, no guarding. Peritoneal dialysis catheter on the right side with no drainage or oozing from that site. Bowel sounds present in 4 quadrants.  EXTREMITIES: Has right below-knee amputation with prosthesis. Left lower extremity: No edema, no clubbing, and no cyanosis. Pedal pulses plus 2.  MUSCULOSKELETAL: No joint effusion or erythema.  PSYCHIATRIC: Appropriate affect. Awake, alert x 3. Intact judgment and insight.  NEUROLOGIC: Nerves grossly intact. Motor 5/5. No focal deficits.   PERTINENT LABORATORIES: Glucose 356, BUN 27, creatinine 3.15, sodium 139, potassium 3.3, chloride 100, CO2 of 26, ALT 12, AST 14, alkaline phosphatase 126, albumin 1.7. White blood cell 8.3, hemoglobin 13.6, hematocrit 42.8, platelets 228,000.   CT abdomen and pelvis without contrast: No acute process identified in the abdomen and pelvis on unenhanced imaging.   ASSESSMENT AND PLAN:  1. Abdominal pain, nausea, vomiting. At this point etiology is unclear. Might be secondary to her known history of gastroparesis as well possible gastroenteritis as well infectious process is probable even though the patient has been on IV vancomycin over the last month. This seems to be unlikely, but we will check peritoneal dialysis catheter  fluid. Aspirate for culture. As well we will obtain blood cultures. We will continue her on IV vancomycin and add levofloxacin pending the rest of her septic workup. We will keep her on p.r.n. Zofran and Reglan as well. We will hold her diuresis as she appears to be mildly dehydrated.  2. End-stage renal disease. We will consult nephrology for hemodialysis, we will continue her on calcium acetate.  3. Diabetes mellitus. We will continue her on Lantus but will decrease the dose. We will add insulin sliding scale.  4. Diabetic neuropathy. Continue with gabapentin. 5. Anemia of end-stage renal disease.  6. Monitor hyperlipidemia. Continue with Vytorin. 7. Depression. Continue with home meds. 8. Hypertension, uncontrolled. Will resume back on her home medicine. We will add p.r.n. hydralazine. 9. Deep vein thrombosis prophylaxis. Subcutaneous heparin.   CODE STATUS: The patient is full code.   TOTAL TIME SPENT ON ADMISSION AND PATIENT CARE: 55 minutes.     ____________________________ Starleen Arms, MD dse:lt D: 10/26/2013 03:02:38 ET T: 10/26/2013 04:59:24 ET JOB#: [id number]  cc: Starleen Arms, MD, <Dictator> Kryslyn Helbig Teena Irani MD ELECTRONICALLY SIGNED 11/03/2013 2:32

## 2014-07-23 NOTE — Consult Note (Signed)
CHIEF COMPLAINT and HISTORY:  Subjective/Chief Complaint non-functional PD catheter   History of Present Illness Patient is a 49 yo WF with a history of ESRD. Has a permcath in right IJ which is working well.  Has a PD catheter that is not working well.  Have tried tpa to the catheter and it will flush but not return.  No abdominal pain or signs of infection.   PAST MEDICAL/SURGICAL HISTORY:  Past Medical History:   Other, see comments:    cardiac stents:    Endometriosis:    nodules on throid:    Asthma:    RBKA:    Other, see comments:    Hysterectomy - Partial:   ALLERGIES:  Allergies:  Penicillin: Unknown  Keflex: Unknown  Shellfish: Unknown  HOME MEDICATIONS:  Home Medications: Medication Instructions Status  vancomycin 1000 milligram(s) intravenous once- after each dialysis for 2 weeks. Active  gentamicin 120 milligram(s) intravenous once- after each dialysis sesson for 2 weeks Active  acetaminophen 325 mg oral tablet 2 tab(s) orally every 4 hours, As needed, mild pain (1-3/10) or temp. greater than 100.4 Active  nicotine 21 mg/24 hr transdermal film, extended release 1 patch transdermal once a day Active  Phenergan 25 mg PO every 6 hours PRN Nausea   Active  insulin glargine 100 units/mL subcutaneous solution 35 unit(s) subcutaneous once a day (at bedtime) Active  amLODIPine 5 mg oral tablet 1 tab(s) orally once a day Active  torsemide 20 mg oral tablet 4 tab(s) orally 2 times a day Active  calcium acetate 667 mg oral capsule 2 cap(s) orally 3 times a day (with meals) Active  polyethylene glycol 3350 oral powder for reconstitution 17 gram(s) orally once a day, As needed, constipation Active  fluticasone nasal 50 mcg/inh nasal spray 1 spray(s) nasal once a day Active  buPROPion 150 mg/12 hours oral tablet, extended release 2 tabs (330m) orally in morning and 1 tab orally once a day in the afternoon. Active  hydrALAZINE 100 mg oral tablet 1 tab(s) orally 3 times a  day Active  omeprazole 40 mg oral delayed release capsule 1 cap(s) orally 2 times a day Active  Senna Lax 8.6 mg oral tablet 1 tab(s) orally 2 times a day, As Needed Active  gabapentin 300 mg oral capsule 2 cap(s) orally 3 times a day Active  Vytorin 10 mg-40 mg oral tablet 1 tab(s) orally once a day Active  Reglan 10 mg oral tablet 1 tab(s) orally 4 times a day (before meals and at bedtime) Active  NovoLOG 100 units/mL subcutaneous solution use as directed subcutaneous as needed. sliding scale Active  Colace 100 mg oral capsule 1 cap(s) orally 2 times a day, As Needed - for Constipation Active   Family and Social History:  Family History Non-Contributory   Social History positive  tobacco (Current within 1 year), negative ETOH   Place of Living Home   Review of Systems:  Fever/Chills No   Cough No   Sputum No   Abdominal Pain No   Diarrhea No   Constipation No   Nausea/Vomiting No   SOB/DOE No   Chest Pain No   Telemetry Reviewed NSR   Dysuria No   Tolerating PT Yes   Tolerating Diet Yes   Medications/Allergies Reviewed Medications/Allergies reviewed   Physical Exam:  GEN well developed, well nourished   HEENT pink conjunctivae, hearing intact to voice   NECK No masses  trachea midline   RESP normal resp effort  no use of  accessory muscles   CARD regular rate  no JVD   VASCULAR ACCESS right IJ permcath, no erythema.  PD catheter in place.  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  Hepatic:    27-Jul-15 23:00, Comprehensive Metabolic Panel  Bilirubin, Total 0.2  Alkaline Phosphatase 126  46-116  NOTE: New Reference Range  10/19/13  SGPT (ALT) 12  14-63  NOTE: New Reference Range  10/19/13  SGOT (AST) 14  Total Protein, Serum 5.7  Albumin, Serum 1.7    30-Jul-15 09:44, Renal Function Panel  Albumin, Serum 1.4  TDMs:    29-Jul-15 19:01, Gentamicin, Peak LAB  Gentamicin, Peak LAB 8.0  Result(s) reported on 27 Oct 2013 at 08:45PM.    30-Jul-15 09:44, Gentamicin, Trough LAB  Gentamicin, Trough LAB 3.9  Result(s) reported on 28 Oct 2013 at 02:16PM.    30-Jul-15 14:58, Gentamicin, Trough LAB  Gentamicin, Trough LAB 1.6  Result(s) reported on 28 Oct 2013 at 03:27PM.  Routine Micro:    28-Jul-15 01:53, Blood Culture  Micro Text Report   BLOOD CULTURE    COMMENT                   NO GROWTH IN 48 HOURS     ANTIBIOTIC  Culture Comment   NO GROWTH IN 48 HOURS   Result(s) reported on 28 Oct 2013 at 02:00AM.  Micro Text Report   BLOOD CULTURE    COMMENT                   NO GROWTH IN 48 HOURS     ANTIBIOTIC  Culture Comment   NO GROWTH IN 48 HOURS   Result(s) reported on 28 Oct 2013 at 02:00AM.    28-Jul-15 02:07, Urine Culture  Micro Text Report   URINE CULTURE    COMMENT                   NO GROWTH IN 18-24 HOURS     ANTIBIOTIC  Specimen Source   CLEAN CATCH  Culture Comment   NO GROWTH IN 18-24 HOURS   Result(s) reported on 27 Oct 2013 at 10:05AM.    29-Jul-15 21:47, Body Fluid Culture w/Smear  Micro Text Report   BODY FLUID CULTURE    COMMENT                   NO GROWTH IN 18-24 HOURS    GRAM STAIN                MODERATE WHITE BLOOD CELLS    GRAM STAIN  NO  ORGANISMS SEEN     ANTIBIOTIC  Specimen Source   PERITONEAL DIALYSIS  Culture Comment   NO GROWTH IN 18-24 HOURS  Gram Stain 1   MODERATE WHITE BLOOD CELLS  Gram Stain 2   NO ORGANISMS SEEN   Result(s) reported on 29 Oct 2013 at 07:04AM.  General Ref:    28-Jul-15 16:50, HBsAg  HBsAg   ========== TEST NAME ==========  ========= RESULTS =========  = REFERENCE RANGE =    HEPATITIS B SURFACE AG    HBsAg Screen  HBsAg Screen                    [   Negative             ]          Negative                  [facility]            No: [id number]            [address], [place], [place] [address]            Lindon Romp, MD         [phone]     Result(s) reported on 27 Oct 2013 at 07:49AM.  Routine Chem:    27-Jul-15 23:00, Comprehensive Metabolic Panel  Glucose, Serum 356  BUN 27  Creatinine (comp) 3.15  Sodium, Serum 139  Potassium, Serum 3.3  Chloride, Serum 100  CO2, Serum 26  Calcium (Total), Serum 9.6  Osmolality (calc) 297  eGFR (African American) 19  eGFR (Non-African American) 16  eGFR values <50m/min/1.73 m2 may be an indication of chronic  kidney disease (CKD).  Calculated eGFR is useful in patients with stable renal function.  The eGFR calculation will not be reliable in acutely ill patients  when serum creatinine is changing rapidly. It is not useful in   patients on dialysis. The eGFR calculation may not be applicable  to patients at the low and high extremes of body sizes, pregnant  women, and vegetarians.  Anion Gap 13    27-Jul-15 23:00, Lipase  Lipase 95  Result(s) reported on 26 Oct 2013 at 12:40AM.    28-Jul-15 12:36, Troponin I  Result Comment   TROPONIN - RESULTS VERIFIED BY REPEAT TESTING.   - C/LINDSAY TRIPP AT 1400 10/26/13-DAS   - READ-BACK PROCESS PERFORMED.   Result(s) reported on 26 Oct 2013 at 02:07PM.    28-Jul-15 14:59, Troponin I  Result Comment   TROPONIN - RESULTS VERIFIED BY REPEAT TESTING.   - CALLED TO  LINDSEY TRIPP 10/26/13   - AT 1400 BY DAS/JEM   Result(s) reported on 26 Oct 2013 at 04:05PM.    28-Jul-15 16:50, Phosphorus, Serum  Phosphorus, Serum 4.7  Result(s) reported on 26 Oct 2013 at 05:33PM.    28-Jul-15 20:44, Troponin I  Result Comment   TROPONIN - RESULTS VERIFIED BY REPEAT TESTING.   - CALLED TO LINDSEY TRIPP 10/26/13   - AT 1400 BY DAS/JEM   Result(s) reported on 26 Oct 2013 at 09:31PM.    29-Jul-15 094:17 Basic Metabolic Panel (w/Total Calcium)  Glucose, Serum 252  BUN 17  Creatinine (comp) 3.41  Sodium, Serum 136  Potassium, Serum 3.4  Chloride, Serum 98  CO2, Serum 30  Calcium (Total), Serum 8.0  Anion Gap 8  Osmolality (calc) 282  eGFR (African American) 17  eGFR (Non-African American) 15  eGFR values <  30m/min/1.73 m2 may be an indication of chronic  kidney disease (CKD).  Calculated eGFR is useful in patients with stable renal function.  The eGFR calculation will not be reliable in acutely ill patients  when serum creatinine is changing rapidly. It is not useful in   patients on dialysis. The eGFR calculation may not be applicable  to patients at the low and high extremes of body sizes, pregnant  women, and vegetarians.    29-Jul-15 04:43, Magnesium, Serum  Magnesium, Serum 1.7  1.8-2.4  THERAPEUTIC RANGE: 4-7 mg/dL  TOXIC: > 10 mg/dL   -----------------------    30-Jul-15 09:44, Renal Function Panel  Glucose, Serum 294  BUN 27  Creatinine (comp) 4.67  Sodium, Serum 132  Potassium, Serum 3.3  Chloride, Serum 94  CO2, Serum 29  Calcium (Total), Serum 7.5  Phosphorus, Serum 3.6  Anion Gap 9  Osmolality (calc) 280  eGFR (African American) 12  eGFR (Non-African American) 10  eGFR values <622mmin/1.73 m2 may be an indication of chronic  kidney disease (CKD).  Calculated eGFR is useful in patients with stable renal function.  The eGFR calculation will not be reliable in acutely ill patients  when serum creatinine is changing rapidly. It is not  useful in   patients on dialysis. The eGFR calculation may not be applicable  to patients at the low and high extremes of body sizes, pregnant  women, and vegetarians.  Cardiac:    27-Jul-15 12:36, CPK-MB, Serum  CPK-MB, Serum 5.9  Result(s) reported on 26 Oct 2013 at 01:56PM.    28-Jul-15 12:36, Troponin I  Troponin I 0.17  0.00-0.05  0.05 ng/mL or less: NEGATIVE   Repeat testing in 3-6 hrs   if clinically indicated.  >0.05 ng/mL: POTENTIAL   MYOCARDIAL INJURY. Repeat   testing in 3-6 hrs if   clinically indicated.  NOTE: An increase or decrease   of 30% or more on serial   testing suggests a   clinically important change    28-Jul-15 14:59, Troponin I  Troponin I 0.13  0.00-0.05  0.05 ng/mL or less: NEGATIVE   Repeat testing in 3-6 hrs   if clinically indicated.  >0.05 ng/mL: POTENTIAL   MYOCARDIAL INJURY. Repeat   testing in 3-6 hrs if   clinically indicated.  NOTE: An increase or decrease   of 30% or more on serial   testing suggests a   clinically important change    28-Jul-15 20:44, Troponin I  Troponin I 0.09  0.00-0.05  0.05 ng/mL or less: NEGATIVE   Repeat testing in 3-6 hrs   if clinically indicated.  >0.05 ng/mL: POTENTIAL   MYOCARDIAL INJURY. Repeat   testing in 3-6 hrs if   clinically indicated.  NOTE: An increase or decrease   of 30% or more on serial   testing suggests a   clinically important change  Routine UA:    28-Jul-15 02:07, Urinalysis  Color (UA) Yellow  Clarity (UA) Hazy  Glucose (UA) >=500  Bilirubin (UA) Negative  Ketones (UA) 1+  Specific Gravity (UA) 1.022  Blood (UA) Negative  pH (UA) 6.0  Protein (UA) >=500  Nitrite (UA) Negative  Leukocyte Esterase (UA) Negative  Result(s) reported on 26 Oct 2013 at 02:30AM.  RBC (UA) 2 /HPF  WBC (UA) 8 /HPF  Bacteria (UA)   NONE SEEN  Epithelial Cells (UA) 1 /HPF  Hyaline Cast (UA) 4 /LPF  Result(s) reported on 26 Oct 2013 at 02:30AM.  Routine Coag:    28-Jul-15  14:59, Activated  PTT  Activated PTT (APTT) 32.0  A HCT value >55% may artifactually increase the APTT. In one study,  the increase was an average of 19%.  Reference: "Effect on Routine and Special Coagulation Testing Values  of Citrate Anticoagulant Adjustment in Patients with High HCT Values."  American Journal of Clinical Pathology 2006;126:400-405.    28-Jul-15 14:59, Prothrombin Time  Prothrombin 12.5  INR 0.9  INR reference interval applies to patients on anticoagulant therapy.  A single INR therapeutic range for coumarins is not optimal for all  indications; however, the suggested range for most indications is  2.0 - 3.0.  Exceptions to the INR Reference Range may include: Prosthetic heart  valves, acute myocardial infarction, prevention of myocardial  infarction, and combinations of aspirin and anticoagulant. The need  for a higher or lower target INR must be assessed individually.  Reference: The Pharmacology and Management of the Vitamin K   antagonists: the seventh ACCP Conference on Antithrombotic and  Thrombolytic Therapy. YBWLS.9373 Sept:126 (3suppl): [id number].  A HCT value >55% may artifactually increase the PT.  In one study,   the increase was an average of 25%.  Reference:  "Effect on Routine and Special Coagulation Testing Values  of Citrate Anticoagulant Adjustment in Patients with High HCT Values."  American Journal of Clinical Pathology 2006;126:400-405.  Routine Hem:    27-Jul-15 23:00, Hemogram, Platelet Count  WBC (CBC) 8.3  RBC (CBC) 5.16  Hemoglobin (CBC) 13.6  Hematocrit (CBC) 42.3  Platelet Count (CBC) 288  Result(s) reported on 26 Oct 2013 at 12:34AM.  MCV 82  MCH 26.3  MCHC 32.1  RDW 14.7    29-Jul-15 04:43, CBC Profile  WBC (CBC) 12.6  RBC (CBC) 4.17  Hemoglobin (CBC) 11.0  Hematocrit (CBC) 34.2  Platelet Count (CBC) 237  MCV 82  MCH 26.4  MCHC 32.2  RDW 14.8  Neutrophil % 75.6  Lymphocyte % 17.7  Monocyte % 5.4  Eosinophil % 0.9  Basophil % 0.4   Neutrophil # 9.5  Lymphocyte # 2.2  Monocyte # 0.7  Eosinophil # 0.1  Basophil # 0.0  Result(s) reported on 27 Oct 2013 at 05:10AM.    30-Jul-15 09:44, CBC Profile  WBC (CBC) 8.7  RBC (CBC) 3.74  Hemoglobin (CBC) 10.0  Hematocrit (CBC) 31.0  Platelet Count (CBC) 197  MCV 83  MCH 26.6  MCHC 32.1  RDW 14.8  Neutrophil % 66.4  Lymphocyte % 22.3  Monocyte % 8.5  Eosinophil % 2.3  Basophil % 0.5  Neutrophil # 5.8  Lymphocyte # 1.9  Monocyte # 0.7  Eosinophil # 0.2  Basophil # 0.0  Result(s) reported on 28 Oct 2013 at 11:58AM.   RADIOLOGY:  Radiology Results: XRay:    04-Apr-15 05:12, Chest Portable Single View  Chest Portable Single View  REASON FOR EXAM:    chest pain  COMMENTS:       PROCEDURE: DXR - DXR PORTABLE CHEST SINGLE VIEW  - Jul 03 2013  5:12AM     CLINICAL DATA:  Chest pain, cough and shortness of breath.    EXAM:  PORTABLE CHEST - 1 VIEW    COMPARISON:  Chest radiograph and CTA of the chest performed  07/25/2009    FINDINGS:  The lungs are well-aerated. Mild vascular congestion is noted. Mild  bibasilar airspace opacities likely reflect atelectasis. There is no  evidence of pleural effusion or pneumothorax.    The cardiomediastinal silhouette is mildly enlarged. No acute  osseous abnormalities are  seen.     IMPRESSION:  Mild vascular congestion and mild cardiomegaly noted. Mild bibasilar  airspace opacities likely reflect atelectasis.      Electronically Signed  By: Garald Balding M.D.    On: 07/03/2013 05:34     Verified By: JEFFREY . Radene Knee, M.D.,    07-Apr-15 21:24, Chest Portable Single View  Chest Portable Single View  REASON FOR EXAM:    line placement  COMMENTS:       PROCEDURE: DXR - DXR PORTABLE CHEST SINGLE VIEW  - Jul 06 2013  9:24PM     CLINICAL DATA:  Dialysis catheter placement.    EXAM:  PORTABLE CHEST - 1 VIEW    COMPARISON:  Chest radiograph performed 07/03/2013    FINDINGS:  The patient's right IJ dual lumen  catheter is seen ending about the  mid to distal SVC.  A small left pleural effusion is seen. Vascular congestion is noted.  This may reflect minimal interstitial edema, given mildly increased  interstitial markings. No pneumothorax is seen.    The cardiomediastinal silhouette is borderline enlarged. No acute  osseous abnormalities are identified.     IMPRESSION:  1. Right IJ dual-lumen catheter seen ending about the mid to distal  SVC.  2.Vascular congestion and borderline cardiomegaly, with a small  left pleural effusion. Mildly increased interstitial markings may  reflect minimal interstitial edema.    Electronically Signed    By: Garald Balding M.D.    On: 07/06/2013 21:24         Verified By: JEFFREY . Radene Knee, M.D.,  [place]:    05-Mar-15 01:13, US Abdomen Limited Survey  US Abdomen Limited Survey  REASON FOR EXAM:    RUQ pain, vomiting  COMMENTS:   Body Site: Gallbladder, Liver, Common Bile Duct    PROCEDURE: [place]  - US ABDOMEN LIMITED SURVEY  - Jun 03 2013  1:13AM     CLINICAL DATA:  Right upper quadrant pain and vomiting    EXAM:  US ABDOMEN LIMITED - RIGHT UPPER QUADRANT    COMPARISON:  None currently available    FINDINGS:  Gallbladder:  No gallstones or wall thickening visualized. No sonographic Murphy  sign noted.    Common bile duct:    Diameter: 4 mm.  Where seen, no filling defect.    Liver:    No focal abnormality is seen. There is poor acoustic transmission,  likely related to the thick anterior abdominal wall. No increased  echogenicity to suggest steatosis. Antegrade flow in the imaged  portal venous system.     IMPRESSION:  Negative right upper quadrant ultrasound.      Electronically Signed    By: Jorje Guild M.D.    On: 06/03/2013 01:57         Verified By: Gilford Silvius, M.D.,    04-Apr-15 09:15, US Kidney Bilateral  US Kidney Bilateral  REASON FOR EXAM:    arf  COMMENTS:       PROCEDURE: [place]  - US KIDNEY  - Jul 03 2013   9:15AM     CLINICAL DATA:  Acute renal failure    EXAM:  RENAL/URINARY TRACT ULTRASOUND COMPLETE    COMPARISON:  06/03/13    FINDINGS:  Right Kidney:  Length: 11.2 cm..Echogenicity within normal limits. No mass or  hydronephrosis visualized.    Left Kidney:    Length: 12.6 cm.. Echogenicity within normal limits. No mass or  hydronephrosis visualized.    Bladder:  Appears normal for degree of bladder distention.     IMPRESSION:  No acute abnormality noted.  Electronically Signed    By: Inez Catalina M.D.    On: 07/03/2013 09:19         Verified By: Everlene Farrier, M.D.,  [place]:    05-Mar-15 01:13, US Abdomen Limited Survey  PACS Image    05-Mar-15 05:13, CT Abdomen and Pelvis Without Contrast  PACS Image    04-Apr-15 05:12, Chest Portable Single View  PACS Image    04-Apr-15 09:15, US Kidney Bilateral  PACS Image    07-Apr-15 11:30, NM MYOCARDIAL SCAN  PACS Image    07-Apr-15 21:24, Chest Portable Single View  PACS Image    07-Jul-15 15:45, CT Abdomen Without Contrast  PACS Image    28-Jul-15 01:31, CT Abdomen and Pelvis Without Contrast  PACS Image  CT:    05-Mar-15 05:13, CT Abdomen and Pelvis Without Contrast  CT Abdomen and Pelvis Without Contrast  REASON FOR EXAM:    (1) RUQ/LUQ pain and vomiting; (2) RUQ/LUQ pain and   vomiting;    NOTE: Nursing t  COMMENTS:       PROCEDURE: CT  - CT ABDOMEN AND PELVIS W0  - Jun 03 2013  5:13AM     CLINICAL DATA:  Right upper and lower quadrant pain with vomiting.    EXAM:  CT ABDOMEN AND PELVIS WITHOUT CONTRAST    TECHNIQUE:  Multidetector CT imaging of the abdomen and pelvis was performed  following the standard protocol without intravenous contrast.  COMPARISON:  None currently available    FINDINGS:  BODY WALL: Unremarkable.    LOWER CHEST: Diffuse coronary atherosclerosis.    ABDOMEN/PELVIS:    Liver: No focal abnormality.    Biliary: No evidence of biliary obstruction or stone.    Pancreas:  Unremarkable.  Spleen: Unremarkable.    Adrenals: Unremarkable.    Kidneys and ureters: No hydronephrosis or stone. Symmetric  perinephric edema.    Bladder: Unremarkable.    Reproductive: Left ovarian size within normal limits for age. The  right ovary is not clearly identified.    Bowel: No obstruction.Normal appendix. 9 mm fatty focus within the  proximal small bowel, which could be ingested or an incidental,  nonobstructive lipoma.  Retroperitoneum: No mass or adenopathy.    Peritoneum: No free fluid or gas.    Vascular: Diffuse, extensive atherosclerosis for age    OSSEOUS: No acute abnormalities.     IMPRESSION:  No hydronephrosis, nephrolithiasis, or appendicitis to explain right  abdominal pain.      Electronically Signed    By: Jorje Guild M.D.    On: 06/03/2013 05:37         Verified By: Gilford Silvius, M.D.,    07-Jul-15 15:45, CT Abdomen Without Contrast  CT Abdomen Without Contrast  REASON FOR EXAM:    ORAL CONTRAST ONLY vomiting and diarrhea  COMMENTS:       PROCEDURE: CT  - CT ABDOMEN STANDARD WO  - Oct 05 2013  3:45PM     CLINICAL DATA:  Nausea and vomiting with some diarrhea since  Saturday; currently on antibiotics for peritonitis ; the patient is  currentlyundergoing hemodialysis rather than peritoneal dialysis.    EXAM:  CT ABDOMEN WITHOUT CONTRAST    TECHNIQUE:  Multidetector CT imaging of the abdomen was performed following the  standard protocol without IV contrast. The patient did receive oral  contrast material.    COMPARISON:  CT scan of the abdomen and pelvis dated June 03, 2013    FINDINGS:  This study is a CT of the abdomen only.    The liver exhibits no focal mass or ductal dilation. The gallbladder  is adequately distended with no evidence of stones or surrounding  inflammatory changes. The pancreas, spleen, adrenal glands, and  kidneys exhibit no acute abnormalities. The caliber of the abdominal  aorta is  normal.    The peritoneal dialysis catheteris seen to enter the mid to lower  abdomen to the right of midline. Only a small portion of the  catheters courses demonstrated in the field of view. There is a tiny  hiatal hernia. The stomach is partially distended with contrast and  grossly normal. The oral contrast has traversed much of the small  bowel. There is no evidence of obstruction. The appendix is  demonstrated. The observed portions of the colon are unremarkable.  No free extraluminal fluid or gas collections are demonstrated.    The lumbar spine exhibits no acute abnormalities. The lung bases are  clear.     IMPRESSION:  1. There is no acute abnormality of the visualized portions of the  bowel.  2. There is no acute hepatobiliary abnormality.  3. The kidneys exhibit no acute abnormalities.  4. The small portion of the peritoneal dialysis catheter  demonstrated appears normal.      Electronically Signed    By: David  Martinique    On: 10/05/2013 16:11         Verified By: DAVID A. [place], M.D., MD    28-Jul-15 01:31, CT Abdomen and Pelvis Without Contrast  CT Abdomen and Pelvis Without Contrast  REASON FOR EXAM:    (1) N/V; (2) BEING TREATED FOR PERITONITIS; PD   CATHETER IN PLACE  COMMENTS:   May transport without cardiac monitor    PROCEDURE: CT  - CT ABDOMEN AND PELVIS W0  - Oct 26 2013  1:31AM     CLINICAL DATA:  Nausea and vomiting since Saturday. Dialysis  patient. Hypertension. No contrast per were of referring physician.    EXAM:  CT ABDOMEN AND PELVIS WITHOUT CONTRAST    TECHNIQUE:  Multidetector CT imaging of the abdomen and pelvis was performed  following the standard protocolwithout IV contrast.  COMPARISON:  10/05/2013    FINDINGS:  Lung bases are clear.  Coronary artery calcifications.    The unenhanced appearance of the liver, spleen, gallbladder,  pancreas, adrenal glands, kidneys, abdominal aorta, inferior vena  cava, and retroperitoneal lymph  nodes is unremarkable. Stomach,  small bowel, and colon are decompressed with predominant stool  filling the colon. Small accessory spleens. No free air or free  fluid in the abdomen. Right lower quadrant peritoneal catheter  likely representing dialysis catheter. Positioning is unchanged  since prior study.    Pelvis: Bladder wall is not thickened. Uterus and ovaries are not  enlarged. Appendix is normal. No inflammatory changes to suggest  diverticulitis in the colon. No free or loculated pelvic fluid  collections. No pelvic mass or lymphadenopathy. Degenerative changes  in the lumbar spine. No destructive bone lesions appreciated.     IMPRESSION:  No acute process identified in the abdomen and pelvis on unenhanced  imaging.      Electronically Signed    By: Lucienne Capers M.D.    On: 10/26/2013 01:37       Verified By: Neale Burly, M.D.,   ASSESSMENT AND PLAN:  Assessment/Admission Diagnosis ESRD, non-functional PD  catheter Right IJ permcath working for HD   Plan Would recommend laparoscopy with possible revision of PD catheter.  Discussed that catheter may not be salvageable, and if not would remove at surgery.  LOA likely necessary.  She is agreeable.  She has Permcath and can be discharged if felt stable.  The surgery is scheduled for 8/5 and can be done as an outpatient.    level 3   Electronic Signatures: Algernon Huxley (MD)  (Signed 31-Jul-15 11:24)  Authored: Chief Complaint and History, PAST MEDICAL/SURGICAL HISTORY, ALLERGIES, HOME MEDICATIONS, Family and Social History, Review of Systems, Physical Exam, LABS, RADIOLOGY, Assessment and Plan   Last Updated: 31-Jul-15 11:24 by Algernon Huxley (MD)

## 2014-07-23 NOTE — Discharge Summary (Signed)
PATIENT NAME:  Kimberly Montgomery, Kimberly Montgomery MR#:  [id number] DATE OF BIRTH:  08-Dec-1965  DATE OF ADMISSION:  07/03/2013 DATE OF DISCHARGE:  07/17/2013  The patient's interim dictation was done by Dr. Auburn Bilberry on 16th, and I have followed the patient today and the patient will go to [facility] rehab after hemodialysis.   DISCHARGE DIAGNOSES: 1.  Acute on chronic diastolic heart failure with progressive renal failure. The patient now is end-stage renal disease, on hemodialysis.  2.  Coronary artery disease with history of stent placement.  3.  History of insulin-dependent diabetes mellitus. 4.  Diabetic neuropathy.  5.  Anemia of chronic disease. 6.  Hyperlipidemia.  7.  Morbid obesity.  8.  Depression.   The patient's interim discharge dictation was reviewed, and I agree with the discharge summary done by Dr. Auburn Bilberry.   DISCHARGE MEDICATIONS: 1.  Amlodipine 10 mg daily. We are going to decrease the amlodipine to 5 mg daily. She was on 10 mg, and I am going to decrease to 5 mg.  2.  Bupropion extended-release 150 mg 2 tablets in the morning, 1 tablet in the afternoon. 3.  Fluticasone nasal spray 50 mcg one spray daily.  4.  Neurontin 300 mg capsule p.o. t.i.d.  5.  The patient is on Vytorin 10/40 mg p.o. daily.  6.  Imdur 30 mg p.o. daily. 7.  Aspirin 81 mg daily. 8.  Zofran as needed for nausea.  9.  Plavix 75 mg p.o. daily. 10.  Ferrous sulfate 325 mg p.o. daily. 11.  Lantus: She will be on 50 units at bedtime. Please note that discharge instructions were done by Dr. Imogene Burn yesterday, so this is different than what they were yesterday. I have decreased the Lantus to 50 units because the patient had hypoglycemia. 12.  Also regarding blood pressure, the patient's discharge instructions included metoprolol XL 100 mg p.o. daily, but the patient is slightly hypotensive. I am decreasing the metoprolol XL to 50 mg p.o. daily.  13.  The patient is on hydralazine 100 mg p.o. t.i.d.  14.  The  patient is on nicotine patch 14 mg transdermal daily.  15.  MiraLax as needed for constipation.  16.  She is on torsemide 20 mg 4 tablets p.o. b.i.d. This medication needs to be adjusted according to her blood pressure response and also edema.   Advised to stop following medications: Furosemide 40 mg 2 tablets p.o. b.i.d., and the patient's tramadol also was stopped and ibuprofen was stopped.   DIET: Low-sodium, ADA and low-carb diet, and also renal diet.   The patient will follow up with [facility] on [address] for Tuesday, Thursday, and Saturday. The patient will get dialysis chair. The patient will be discharged to [facility] rehab and have next dialysis session on Tuesday. She will have hemodialysis today and then go to rehab after that. She is not hypoxic. O2 sat is 98% on 2 liters, so we are going to see if we can wean off her oxygen.   TIME SPENT: On discharge summary, discharge medications, reviewing old discharge summaries and discharge instructions took more than 30 minutes.   ____________________________ Katha Hamming, MD sk:jcm D: 07/17/2013 08:32:02 ET T: 07/17/2013 19:18:22 ET JOB#: [id number]  cc: Katha Hamming, MD, <Dictator> Katha Hamming MD ELECTRONICALLY SIGNED 08/03/2013 13:40

## 2014-07-23 NOTE — H&P (Signed)
PATIENT NAME:  Kimberly, Montgomery MR#:  [id number] DATE OF BIRTH:  [birth year]  DATE OF ADMISSION:  01/01/2014  REFERRING PHYSICIAN: Dorothea Glassman, MD    ADMITTING PHYSICIAN:  Kimberly Figures, MD     PRIMARY CARE DOCTOR:  [facility]    PRIMARY NEPHROLOGIST:  [facility] and Kimberly Montgomery follows during hospitalizations at [facility].    CHIEF COMPLAINT: Shortness of breath with chest discomfort, chest pressure.    HISTORY OF PRESENT ILLNESS: This is a 49 year old female with a known history of end stage renal disease, on hemodialysis transitioning to peritoneal dialysis, presents to the Emergency Room with complaints of shortness of breath, which has been going on for the past couple of days, and today she felt some chest pressure/pain in the mid sternal region hence came to the Emergency Department for further evaluation.  She also has been having difficulty sleeping in lying flat in the bed for the past 2 days because of having shortness of breath.  She also mentions that she has gained about 6 kg of weight in the last 3 days.  Of note, she is a known end stage renal disease patient, has been on hemodialysis since April 2015 and currently being transitioned to peritoneal dialysis.  She does peritoneal dialysis at home and last dialysis, she had a partial peritoneal dialysis today at her home, and the last hemodialysis was done last Tuesday, which was 12/28/2013.  Denies any palpitations, diaphoresis or dizziness, loss of consciousness.  Denies any cough, wheezing.  Denies nausea, vomiting, diarrhea, abdominal pain.  She does have some chronic constipation for which she takes laxatives and which is under fairly control.  Denies any dysuria, hematuria.  Denies any focal weakness or numbness.    In the Emergency Room the patient was evaluated by the ED physician for her complaints and she had 2 sets of troponins, which were unremarkable, EKG, normal sinus rhythm with no acute  ST, T changes and a chest x-ray essentially unremarkable.  Because of her history of coronary artery disease, status post stents, and with the complaints of chest pain/pressure, she is admitted to rule out acute coronary event.  Of note, the patient mentioned that she was admitted to [facility] in August of this year with chest pain, pressure, and she was seen by her cardiologist and at that time troponins were elevated following which she underwent cardiac catheterization and per patient she was told it was unremarkable.    PAST MEDICAL HISTORY:   1.  End stage renal disease, on hemodialysis transitioning to peritoneal dialysis.   2.  Coronary artery disease, status post stent.  Per patient, most recent heart catheterization at [facility] in August of this year was unremarkable.   3.  History of congestive heart failure.   4.  Diabetes mellitus type 2, on insulin.   5.  Diabetic neuropathy and gastroparesis.   6.  Hypertension.   7.  Hyperlipidemia.   8.  Obesity.   9.  Depression.   10.  Active tobacco use.    PAST SURGICAL HISTORY:   1.  Tubal ligation.  2.  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   3.  Removal of fallopian tube and also ovaries secondary due to endometriosis.    ALLERGIES:  KEFLEX, PENICILLINS, SHELLFISH, SHELLFISH, CEPHALOSPORINS.    CURRENT HOME MEDICATIONS:  Amlodipine 10 mg once a day, aspirin 81 mg orally once a day, bupropion 150 mg 1 tablet in the  morning, bupropion 150 mg 1 tablet in the evening, calcium acetate 667 mg 2 capsules 3 times a day with meals, clopidogrel 75 mg 1 tablet a day, docusate sodium 100 mg 2 times a day, Flonase nasal spray 1 spray once a day as needed, gabapentin 300 mg capsule 1 capsule 3 times a day, hydralazine 100 mg tablet 1 tablet 3 times a day, Lantus 44 units subcutaneously once a day at bedtime, metolazone 5 mg tablet 1 time once a day as needed for swelling, metoprolol tartrate 25 mg tablet 1 tablet once a day in the  afternoon, MiraLax 51 grams orally 2 times a day, NovoLog subcutaneous 3 times a day as needed per sliding scale, omeprazole 40 mg tablet 1 capsule 2 times a day, senna laxative 8.6 mg 1 tablet 2 times a day as needed for constipation, Torsemide 100 mg tablet 1 tablet orally once a day, tramadol 50 mg tablet 1 tablet orally every 6 hours as needed for pain, Vytorin 10/40 mg tablet 1 tablet orally once a day.    SOCIAL HISTORY:   Lives at home.  Continues to smoke half a pack per day.  No alcohol or no illicit drug uses.    FAMILY HISTORY:  Both parents significant for coronary artery disease and there is also COPD in the family.    REVIEW OF SYSTEMS:   Alert, awake, and oriented x3, pleasant and cooperative, comfortably resting in the bed.  Obese.  Well nourished.   CONSTITUTIONAL:  Denies any fever, fatigue.  She does have some weight gain of about 6 kg in the last 3 days.   EYES:  Denies any blurred or double vision. No pain.  EARS, NOSE AND THROAT:  No tinnitus. No ear pain. No hearing loss. No nasal discharge. RESPIRATORY:  She does have some increasing shortness of breath for the past 2 days, which is preventing her to lay down flat.  Otherwise, denies any cough, no wheezing, no hemoptysis, no pain with respirations. CARDIOVASCULAR:  She does have mid sternal chest discomfort/pressure. No radiation. No palpitations. No dizziness or syncope. GASTROINTESTINAL:  No nausea, vomiting or diarrhea. No abdominal pain. Chronic GERD symptoms controlled with daily proton pump inhibitor.  Chronic constipation controlled with laxatives.   GENITOURINARY:  Negative for any dysuria or hematuria.  ENDOCRINE:  Denies any polyuria, polydipsia. No heat or cold intolerance.   HEMATOLOGIC/ONCOLOGY:  No easy bruising or bleeding. SKIN:  No rash. No lesions. MUSCULOSKELETAL:  Chronic mild arthritis pains under control with tramadol.  NEUROLOGIC:  Negative for any numbness, weakness.  No history of any CVA or  TIA. PSYCHIATRIC:  She does have some depression, which is stable.    PHYSICAL EXAMINATION:   VITAL SIGNS:  Temperature 98.0 degrees Fahrenheit, pulse rate 85 per minute, regular, respirations 19, blood pressure 128/67, O2 saturations 99% on room air.  Pain scale 0.   GENERAL:  Well-developed, well-nourished, obese lady, alert, pleasant and cooperative, in no acute distress.   HEENT:  Head:  Normocephalic and atraumatic.  Eyes:  Pupils equal and reacting to light. No conjunctival pallor. No scleral icterus.  Extraocular movements intact. Nose:  No nasal lesions. No drainage.  Ears:  No drainage. No external lesions. Mouth:  No oropharyngeal lesions. No exudate.  NECK:  Supple. No JVD.  No thyromegaly. No carotid bruit. Range of motion of neck normal. No cervical lymphadenopathy.   RESPIRATORY:  Good respiratory effort.  Clear to auscultation and percussion.  CARDIOVASCULAR:  Rate regular, regular rate  and rhythm, no murmurs, gallops or clicks.   GASTROINTESTINAL:  No tenderness or masses, no hepatosplenomegaly. Bowel sounds equal in all 4 quadrants.  Abdomen not distended. No guarding, no rigidity, no rebound. Does have PD catheter in the right lower abdomen.  Site clean.   CHEST WALL:  She does have right IJ catheter for hemodialysis on the right upper chest and the site is clean without any evidence of local infection.   GENITOURINARY:  Deferred.  MUSCULOSKELETAL:  Normal range of motions.  Right lower extremity below-knee amputation. Gait not tested.  SKIN:  Inspection within normal limits.  Well hydrated.  LYMPHATIC:  No cervical lymphadenopathy.  VASCULAR:  Good dorsalis pedis, posterior tibial pulses on the left lower extremity.  Does have some left lower extremity 1+ pedal edema.   NEUROLOGICAL:  Cranial nerves II-XII intact.  Reflexes on the left lower extremity normal.   PSYCHIATRIC:  Judgment and insight adequate. Alert and oriented x 3. Memory and mood within normal limits.     LABORATORY DATA:  BNP elevated at 486.  Serum glucose 255, BUN 57, creatinine 4.12, sodium 138, potassium 4.6, chloride 104, bicarb 26, estimated GFR less than 12, total serum calcium 8.5, total protein 5.8, albumin 2.4, total bilirubin 0.2, alkaline phosphatase 133, AST 27, ALT 31. Troponin first set 0.02, second set 0.02.  TSH 2.25.  CBC:  WBC 8.5, hemoglobin 11.1, hematocrit 34.9, platelet count 317, MCV 87.  UA:  Glucose 150, protein 100 mg/dL, hyaline casts per low power field.  Chest x-ray:  Right IJ catheter tip projects over SVC and SVC/RA junction.  Lungs clear.  No pleural fluid.  No acute findings.  EKG:  Normal sinus rhythm, poor R wave progression in the anterior chest leads.    ASSESSMENT AND PLAN:  A 49 year old pleasant white female with a past medical history of end-stage renal disease, on hemodialysis transitioning to peritoneal dialysis, history of coronary artery disease, status post stent, hypertension, history of congestive heart failure presents to the Emergency Room with shortness of breath ongoing for the past couple of days with increase in the weight, gain of about 6 kg.  Today she felt chest pressure, pain, hence came to the Emergency Room for further evaluation.   1.  Ongoing shortness of breath preventing her to lay down flat associated with 6 kg increase in the weight.  Most likely secondary due to fluid overload probably from inadequate peritoneal dialysis.  The patient denies any dietary noncompliance, also patient mentioned that she is compliant with taking her daily medications.   2.  Chest pain/pressure.  History of coronary artery disease, status post stents, rule out acute coronary event.  Seems less likely and patient had a cardiac catheterization done at [facility] in August of this year, which per patient was unremarkable. Chest pressure most likely 2/2 fluid overload. 3.  End-stage renal disease, on hemodialysis with transition to peritoneal dialysis.  Last hemodialysis on  12/28/2013.  She had partial peritoneal dialysis at home today.  And she is due for hemodialysis tomorrow.   4.  History of coronary artery disease, status post stents.  Recent admission at [facility] for chest pain with elevated troponins and with subsequent heart catheterization, and per patient, heart catheterization results were unremarkable.   5.  History of congestive heart failure.  Currently patient might have some fluid overload secondary due to inadequate peritoneal dialysis even though chest x-ray does not show any fluid retention.  We will give Lasix IV 2 doses and  follow up clinically.  We will request for hemodialysis to be done tomorrow per her schedule.   6.  Diabetes mellitus, on Lantus, stable clinically.  Continue home medications, sliding-scale insulin, and monitor sugars closely.   7.  History of diabetic neuropathy and gastroparesis, stable on home medications.  Continue home medications.   8.  History of hypertension, on home medications, well controlled.   9.  Anemia of end-stage renal disease.  H and H mild low but stable.  Continue to monitor.   10.  History of hyperlipidemia.  Continue Vytorin.   11.  History of depression.  Continue with home medications.   12.  Deep venous thrombosis prophylaxis.  Subcutaneous heparin.   13.  Code status.  The patient is full code.    TOTAL TIME SPENT ON ADMISSION:  55 minutes.    ____________________________ Kimberly Figures, MD enr:AT D: 01/01/2014 00:46:16 ET T: 01/01/2014 03:31:21 ET JOB#: [id number]  cc: Kimberly Figures, MD, <Dictator> [facility] Kimberly Figures MD ELECTRONICALLY SIGNED 01/01/2014 10:42

## 2014-07-23 NOTE — Consult Note (Signed)
I was consulted for abd pain and peritoneal dialysis catheter problems. . Her CT was neg.  See dictated note.  She has had abd pain since the catheter was inserted and there have been clogging problems with it also.  I have essentially no experience with complications of peritoneal dialysis catheters and will be of virtually no help in this case.  She had ulcer disease when I did an EGD over 20 years ago at [facility]. She is on Protonix 40mg  bid now. Consider [place] of abd but it is a low yield option given her clinical history.  I would increase her Miralax and give 2 glasses a day at least for her current constipation.  Electronic Signatures: Scot Jun (MD)  (Signed on 29-Jul-15 19:55)  Authored  Last Updated: 29-Jul-15 19:55 by Scot Jun (MD)

## 2014-07-23 NOTE — Consult Note (Signed)
PATIENT NAME:  Kimberly Montgomery, Kimberly Montgomery MR#:  [id number] DATE OF BIRTH:  June 22, 1965  DATE OF CONSULTATION:  10/28/2013  REFERRING PHYSICIAN:   CONSULTING PHYSICIAN:  Scot Jun, MD  HISTORY OF PRESENT ILLNESS: The patient is a 49 year old white female who was admitted to the hospital. She is having problems with a peritoneal dialysis catheter and having abdominal pain. I was asked to see her in consultation.   She has had this catheter in for a month, and it got clogged a few days ago. It has been causing her discomfort, which was worse when the catheter was clogged. She said that they have put something in the tube, it sounds like tPA, to try to unclog it. She also has a hemodialysis catheter in her jugular vein on the right.   She has been a diabetic for many years, 27 years to be exact, and she lost her right leg 4 years ago to complications of diabetes. Past medical history also includes a history of coronary artery disease with 3 stents in her heart, history of congestive heart failure. I actually did an upper endoscopy on her in [facility] over 20 years ago.   A CT of the abdomen and pelvis did not show any causes for her to have abdominal pain.   PAST MEDICAL HISTORY:  1. Coronary artery disease with stents.  2. Congestive heart failure.  3. End-stage renal disease.  4. Diabetes, 27 years.  5. Diabetic neuropathy.  6. Hypertension.  7. Hyperlipidemia.  8. Obesity.  9. Depression.  10. Previous removal of right leg, BKA, for a diabetic foot ulcer. 11. Tubal ligation.   HABITS: Smokes a half-a-pack a day. Does not drink alcohol. No street drugs.   ALLERGIES: KEFLEX, PENICILLIN, SHELLFISH, AND CEPHALOSPORINS.   HOME MEDICATIONS: Aspirin 81 mg a day. Vytorin 10/40 one tablet daily,  Insulin 50 units of Lantus at bedtime, NovoLog sliding scale, Reglan 10 mg oral as needed before meals, Norvasc 5 mg a day, torsemide 20 mg 4 tablets 2 times a day, Colace 100 mg b.i.d.,  MiraLax p.r.n., senna as needed, calcium acetate 667 two tablets 3 times a day, omeprazole 40 mg 2 times a day, Wellbutrin SR 300 mg oral 2 times a day, hydralazine 100 mg oral 3 times a day, Phenergan p.r.n.   PHYSICAL EXAMINATION:  GENERAL: White female in no distress.  VITAL SIGNS: Temperature 98.8, pulse 109, respirations 20, blood pressure 112/62, pulse oximetry 90% on room air.  GENERAL: White female in no acute distress.  HEENT: Sclerae nonicteric. Conjunctivae negative.  CHEST: Clear in the anterior fields  HEART: Shows normal S1 and S2. No murmurs I can hear.  ABDOMEN: Mild tenderness right epigastric area. Dialysis catheter in the right side. No drainage. Bowel sounds are present.  EXTREMITIES: Show right below the knee amputation. NEUROLOGIC: Affect appropriate, a little sleepy.   LABORATORY DATA: Glucose 356, BUN 27, creatinine 3.15, sodium 139, potassium 3.3, chloride 100, CO2 of 26, ALT 12, AST 14, alkaline phosphatase 126, albumin 1.7. White count 8.3, hemoglobin 13.6, platelet count 228,000.   ASSESSMENT: Abdominal pain. It appears to be related to the insertion of a catheter a month ago. Nothing out of the ordinary showed up on her CAT scan, otherwise. It is certainly possible that she could have ulcers or gastritis, or gallbladder disease that is not showing up on a CAT scan. If you wish to pursue evaluation of this pain and suspect other than peritoneal catheter cause, then I  would get an ultrasound of the abdomen, although the yield likely will be low. Because of the possibility of ulcer disease, she is on omeprazole 40 mg 2 times a day, and I would leave this as it is. I have no real expertise in dealing with the care and feeding of peritoneal catheters, and probably would be of little assistance to you this matter.   Sincerely,    ____________________________ Scot Jun, MD rte:jr D: 10/28/2013 17:39:50 ET T: 10/28/2013 18:32:12 ET JOB#: [id number]  cc: Scot Jun, MD, <Dictator> Starleen Arms, MD Hope Pigeon Elisabeth Pigeon, MD Scot Jun MD ELECTRONICALLY SIGNED 11/08/2013 18:01

## 2014-07-23 NOTE — Consult Note (Signed)
Pt feels better today, less discomfort.  No bowel movement today.  Eating.  Peritoneal fluid had over 100 WBC in it. No new suggestions.  i will sign off.  Electronic Signatures: Scot Jun (MD)  (Signed on 30-Jul-15 17:05)  Authored  Last Updated: 30-Jul-15 17:05 by Scot Jun (MD)

## 2014-07-23 NOTE — Consult Note (Signed)
Present Illness Ms Kerce is a 49 yo woman with a h/o HTN, DM, CAD who presented two days ago with chest pain and trouble breathing. We are asked to see the patient at the request of Dr. Tressia Miners of the hospitalist group for worsening of her Cr. Her primary nephrologist is Dr. Trinda Pascal. She was involved in rehearsal dinner/events for her best friend's wedding, including catered meals, when she awoke late Fri/early Sat with symptoms of CP and SOB. She was admitted to hospitalist service and was found to have a Cr elevated above her baseline of ~2.8 to 3.75. Baseline eGFR is ~20, and her admission eGFR was 13. Over the past two days she has been diuresed with IV lasix with concerns for volume overload. Net output since admission is ~3L. She notices a difference and feels better, but she did have another episode of chest pain and trouble breathing last night. She has noticed improvement in LLE swelling, especially with the knee high compression stockings, but above the stockings, there is still swelling, and she feels it is harder. Diuretics are being changed to PO today.  She is a poorly controlled diabetic. Follows at [facility] with Roxy Horseman.   She has had previous discussions with Dr. Smith Mince about dialysis and has indicated that if she were to need dialysis, she would want hemodialysis. She also was about to begin the evaluation process for transplant about a year ago, but her husband died then, and she hasn't gone back since to pursue.   Home Medications: Medication Instructions Status  Zofran ODT 8 mg oral tablet, disintegrating 1 tab(s) orally every 4 hours, As Needed - for Nausea, Vomiting Active  fluticasone nasal 50 mcg/inh nasal spray 1 spray(s) nasal once a day Active  buPROPion 150 mg/12 hours oral tablet, extended release 2 tabs (32m) orally in morning and 1 tab orally once a day in the afternoon. Active  hydrALAZINE 100 mg oral tablet 1 tab(s) orally 3 times a day Active   furosemide 40 mg oral tablet 2 tab(s) orally 2 times a day Active  omeprazole 40 mg oral delayed release capsule 1 cap(s) orally 2 times a day Active  Vitamin D2 50,000 intl units oral capsule 1 cap(s) orally once a month for 3 more months Active  amLODIPine 10 mg oral tablet 1 tab(s) orally once a day Active  Senna Lax 8.6 mg oral tablet 1 tab(s) orally 2 times a day, As Needed Active  gabapentin 300 mg oral capsule 2 cap(s) orally 3 times a day Active  Metoprolol Succinate ER 100 mg oral tablet, extended release 1 tab(s) orally once a day Active  metolazone 5 mg oral tablet 1 tab(s) orally once a day Active  Vytorin 10 mg-40 mg oral tablet 1 tab(s) orally once a day Active  isosorbide mononitrate extended release 30 mg oral tablet, extended release 1 tab(s) orally once a day (in the morning) Active  aspirin 81 mg oral tablet 1 tab(s) orally once a day Active  acetaminophen-oxyCODONE 325 mg-5 mg oral tablet 1 tab(s) orally every 4 hours, As Needed - for Pain Active  traMADol 50 mg oral tablet 1 tab(s) orally every 6 hours, As Needed - for Pain Active  clopidogrel 75 mg oral tablet 1 tab(s) orally once a day Active  ibuprofen 600 mg oral tablet 1 tab(s) orally every 6 hours, As Needed - for Pain Active  ferrous sulfate 325 mg (65 mg elemental iron) oral enteric coated tablet 1  orally once  a day Active  Lantus 100 units/mL subcutaneous solution 65 unit(s) subcutaneous once a day (at bedtime) Active  NovoLOG 100 units/mL subcutaneous solution use as directed subcutaneous as needed. sliding scale Active  Colace 100 mg oral capsule 1 cap(s) orally 2 times a day, As Needed - for Constipation Active    Penicillin: Unknown  Keflex: Unknown  Shellfish: Unknown  Case History and Physical Exam:  Chief Complaint Chest Pain  Shortness of Breath   Past Medical Health Coronary Artery Disease, Hypertension, Diabetes Mellitus, Other, CKD IV   Past Surgical History Cardiac Catheterization  Right BKA    Primary Care Provider Other  [facility]   Family History Coronary Artery Disease  Hypertension  Diabetes Mellitus  COPD   HEENT PERLA   Neck/Nodes Supple  No Adenopathy   Chest/Lungs Clear  Other  diminished air movement at bases bilaterally   Breasts Not examined   Cardiovascular No Murmurs or Gallops   Abdomen Benign   Genitalia Not examined   Rectal Not examined   Musculoskeletal Full range of motion   Neurological Grossly WNL   Skin WNL   Nursing/Ancillary Notes: **Vital Signs.:   06-Apr-15 04:10  [place] 36.8  Pulse Pulse 76  Systolic BP Systolic BP 765  Diastolic BP (mmHg) Diastolic BP (mmHg) 63  Pulse Ox % Pulse Ox % 95  Pulse Ox Activity Level  At rest  Oxygen Delivery 2L  *Intake and Output.:   06-Apr-15 01:58  Grand Totals Intake:   Output:  350    Net:  -[place].:  -4650    04:11  Grand Totals Intake:   Output:  150    Net:  -150 24 Hr.:  -1840    Shift 07:00  Grand Totals Intake:   Output:  1000    Net:  -1000 24 Hr.:  -1840  Length of Stay Totals Intake:  360 Output:  3400    Net:  -3040    Daily 07:00  Grand Totals Intake:  360 Output:  2200    Net:  -3546 56 Hr.:  -8127  Length of Stay Totals Intake:  360 Output:  3400    Net:  -5170    08:15  Grand Totals Intake:  240 Output:      Net:  017 49 Hr.:  240    08:25  Grand Totals Intake:   Output:  150    Net:  -150 24 Hr.:  90    Shift 15:00  Grand Totals Intake:  240 Output:  150    Net:  90 24 Hr.:  90  Length of Stay Totals Intake:  600 Output:  4496    Net:  -7591   Cardiology:  04-Apr-15 12:04   Echo Doppler REASON FOR EXAM:     COMMENTS:     PROCEDURE: [place] - ECHO DOPPLER COMPLETE(TRANSTHOR)  - Jul 03 2013 12:04PM   RESULT: Echocardiogram Report  Patient Name:   BEV DRENNEN Date of Exam: 07/03/2013 Medical Rec #:  [id number]            Custom1: Date of Birth:  1966/03/02          Height:       69.0 in Patient Age:    72 years           Weight:       260.0 lb Patient Gender: F  BSA:          2.31 m??  Indications: CHF Sonographer:    LTM Referring Phys: Gladstone Lighter  Sonographer Comments: Suboptimal images secondary to patient body habitus.  Summary:  1. Left ventricular ejection fraction, by visual estimation, is 60 to  65%.  2. Moderately increased left ventricular septal thickness.  3. Pseudonormal pattern of LV diastolic filling.  4. Mild to moderately increased left ventricular internal cavity size.  5. Severely increased left ventricular posterior wall thickness. 2D AND M-MODE MEASUREMENTS (normal ranges within parentheses): Left Ventricle:     Normal IVSd (2D):      1.30 cm (0.7-1.1) LVPWd (2D):     1.52 cm (0.7-1.1) Aorta/LA:                  Normal LVIDd (2D):     5.75 cm (3.4-5.7) Left Atrium (2D): 3.90 cm (1.9-4.0) LVIDs (2D):     3.84 cm LV FS (2D):     33.2 %   (>25%) LV EF (2D):     61.1 %   (>50%)   Right Ventricle:                                   RVd (2D): LV DIASTOLIC FUNCTION: MV Peak E: 1.42 m/s Decel Time: 269 msec MV Peak A: 0.96 m/s E/A Ratio: 1.47 SPECTRAL DOPPLER ANALYSIS (where applicable): Mitral Valve: MV Max Vel:   1.77 m/s MV P1/2 Time: 78.01 msec MV Mean Grad: 5.0 mmHg MV Area, PHT: 2.82 cm?? Aortic Valve: AoV Max Vel: 2.27 m/s AoV Peak PG: 20.6 mmHg AoV Mean PG:  11.0 mmHg LVOT Vmax: 0.96 m/s LVOT VTI: 0.207 m LVOT Diameter: 2.30 cm AoV Area, Vmax: 1.76 cm?? AoV Area, VTI: 1.74 cm?? AoV Area, Vmn: 1.52 cm?? Pulmonic Valve: PV Max Velocity: 1.19 m/s PV Max PG: 5.7 mmHg PV Mean PG:  PHYSICIAN INTERPRETATION: Left Ventricle: The left ventricular internal cavity size was mild to  moderately increased. LV septal wall thickness was moderately increased.  LV posterior wall thickness was severely increased. Left ventricular  ejection fraction, by visual estimation, is 60 to 65%. Spectral Doppler  shows pseudonormal pattern of LV diastolic filling. Right  Ventricle: The right ventricle was not well seen. The right  ventricular size is normal. Global RV systolic function is normal. Left Atrium: The left atrium is normal in size. Right Atrium: The right atrium is normal in size. Pericardium: There is no evidence of pericardial effusion. Mitral Valve: The mitral valve is normal in structure. No evidence of  mitral valve regurgitation is seen. Tricuspid Valve: The tricuspid valve is not well seen. Trivial tricuspid  regurgitation is visualized. Aortic Valve: The aortic valve is normal. The aortic valve is  structurally normal, with no evidence of sclerosis or stenosis. No  evidence of aortic valve regurgitation is seen. Pulmonic Valve: The pulmonic valve is not well seen. Aorta: The aortic root is normal in size and structure. Venous: The inferior vena cava was normal.  [id number] Mertie Moores Electronically signed by [phone] Mertie Moores Signature Date/Time: 07/04/2013/12:57:22 PM *** Final ***  IMPRESSION: .    Verified By: Wonda Cheng. Acie Fredrickson, M.D., MD  Routine Chem:  04-Apr-15 04:40   Glucose, Serum  342  BUN  51  Creatinine (comp)  3.75  Sodium, Serum  135  Potassium, Serum 4.6  Chloride, Serum 104  CO2, Serum 24  Calcium (Total), Serum  7.3  eGFR (Non-African American)  13 (eGFR values <6m/min/1.73 m2 may be an indication of chronic kidney disease (CKD). Calculated eGFR is useful in patients with stable renal function. The eGFR calculation will not be reliable in acutely ill patients when serum creatinine is changing rapidly. It is not useful in  patients on dialysis. The eGFR calculation may not be applicable to patients at the low and high extremes of body sizes, pregnant women, and vegetarians.)  Hemoglobin A1c ([facility])  11.7 (The American Diabetes Association recommends that a primary goal of therapy should be <7% and that physicians should reevaluate the treatment regimen in patients with HbA1c values consistently >8%.)   B-Type Natriuretic Peptide ([facility]  3510 (Result(s) reported on 03 Jul 2013 at [facility])  05-Apr-15 07:07   Glucose, Serum 72  BUN  52  Creatinine (comp)  3.94  Sodium, Serum  135  Potassium, Serum 3.6  Chloride, Serum 102  CO2, Serum 25  Calcium (Total), Serum  7.5  eGFR (Non-African American)  13 (eGFR values <667mmin/1.73 m2 may be an indication of chronic kidney disease (CKD). Calculated eGFR is useful in patients with stable renal function. The eGFR calculation will not be reliable in acutely ill patients when serum creatinine is changing rapidly. It is not useful in  patients on dialysis. The eGFR calculation may not be applicable to patients at the low and high extremes of body sizes, pregnant women, and vegetarians.)  Routine Hem:  04-Apr-15 04:40   Hemoglobin (CBC)  8.7  Hematocrit (CBC)  26.0   [place]   04-Apr-15 09:15, [place] Bilateral  [place] Bilateral   REASON FOR EXAM:    arf  COMMENTS:       PROCEDURE: [place]  - Jul 03 2013  9:15AM     CLINICAL DATA:  Acute renal failure    EXAM:  RENAL/URINARY TRACT ULTRASOUND COMPLETE    COMPARISON:  06/03/13    FINDINGS:  Right Kidney:  Length: 11.2 cm..Echogenicity within normal limits. No mass or  hydronephrosis visualized.    Left Kidney:    Length: 12.6 cm.. Echogenicity within normal limits. No mass or  hydronephrosis visualized.    Bladder:    Appears normal for degree of bladder distention.     IMPRESSION:  No acute abnormality noted.  Electronically Signed    By: MaInez Catalina.D.    On: 07/03/2013 09:19         Verified By: MAEverlene FarrierM.D.,  Cardiology:    04-Apr-15 12:04, Echo Doppler  Echo Doppler   REASON FOR EXAM:      COMMENTS:       [facility]STHOR)  - Jul 03 2013 12:04PM     RESULT: Echocardiogram Report    Patient Name:   LAHAIDE KLINKERate of Exam: 07/03/2013  Medical Rec #:  [id number]          Custom1:  Date of Birth:   10/06/00/1967        Height:       69.0 in  Patient Age:    [age]          Weight:       260.0 lb  Patient Gender: F                 BSA:          2.31 m??    Indications: CHF  Sonographer:    LTM  Referring Phys: Gladstone Lighter    Sonographer Comments: Suboptimal images secondary to patient body habitus.    Summary:   1. Left ventricular ejection fraction, by visual estimation, is 60 to   65%.   2. Moderately increased left ventricular septal thickness.   3. Pseudonormal pattern of LV diastolic filling.   4. Mild to moderately increased left ventricular internal cavity size.   5. Severely increased left ventricular posterior wall thickness.  2D AND M-MODE MEASUREMENTS (normal ranges within parentheses):  Left Ventricle:     Normal  IVSd (2D):      1.30 cm (0.7-1.1)  LVPWd (2D):     1.52 cm (0.7-1.1) Aorta/LA:                  Normal  LVIDd (2D):     5.75 cm (3.4-5.7) Left Atrium (2D): 3.90 cm (1.9-4.0)  LVIDs (2D):     3.84 cm  LV FS (2D):     33.2 %   (>25%)  LV EF (2D):     61.1 %   (>50%)   Right Ventricle:                                    RVd (2D):  LV DIASTOLIC FUNCTION:  MV Peak E: 1.42 m/s Decel Time: 269 msec  MV Peak A: 0.96 m/s  E/A Ratio: 1.47  SPECTRAL DOPPLER ANALYSIS (where applicable):  Mitral Valve:  MV Max Vel:   1.77 m/s MV P1/2 Time: 78.01 msec  MV Mean Grad: 5.0 mmHg MV Area, PHT: 2.82 cm??  Aortic Valve: AoV Max Vel: 2.27 m/s AoV Peak PG: 20.6 mmHg AoV Mean PG:   11.0 mmHg  LVOT Vmax: 0.96 m/s LVOT VTI: 0.207 m LVOT Diameter: 2.30 cm  AoV Area, Vmax: 1.76 cm?? AoV Area, VTI: 1.74 cm?? AoV Area, Vmn: 1.52 cm??  Pulmonic Valve:  PV Max Velocity: 1.19 m/s PV Max PG: 5.7 mmHg PV Mean PG:    PHYSICIAN INTERPRETATION:  Left Ventricle: The left ventricular internal cavity size was mild to   moderately increased. LV septal wall thickness was moderately increased.   LV posterior wall thickness was severely increased. Left ventricular   ejection  fraction, by visual estimation, is 60 to 65%. Spectral Doppler   shows pseudonormal pattern of LV diastolic filling.  Right Ventricle: The right ventricle was not well seen. The right   ventricular size is normal. Global RV systolic function is normal.  Left Atrium: The left atrium is normal in size.  Right Atrium: The right atrium is normal in size.  Pericardium: There is no evidence of pericardial effusion.  Mitral Valve: The mitral valve is normal in structure. No evidence of   mitral valve regurgitation is seen.  Tricuspid Valve: The tricuspid valve is not well seen. Trivial tricuspid   regurgitation is visualized.  Aortic Valve: The aortic valve is normal. The aortic valve is   structurally normal, with no evidence of sclerosis or stenosis. No   evidence of aortic valve regurgitation is seen.  Pulmonic Valve: The pulmonic valve is not well seen.  Aorta: The aortic root is normal in size and structure.  Venous: The inferior vena cava was normal.    [id number] Mertie Moores  Electronically signed by [phone] Mertie Moores  Signature Date/Time: 07/04/2013/12:57:22 PM  *** Final ***    IMPRESSION: .  Verified By: Wonda Cheng. NAHSER, M.D., MD    Impression 49 yo woman with CKD IV, likely 2/2 diabetic nephropathy with contribution from HTN, with worsening kidney function and volume overload. Unclear yet if this is an acute elevation in Cr that will resolve with volume management and diuresis or a progression of her chronic kidney disease, or a combination. She had a likely salt load, which could have brought it on, but she is also a poorly controlled diabetic s/p BKA and with recent diagnosis of diabetic gastroparesis.   Plan Need to determine if there is a component of volume overload. She has responded appropriately to IV diuresis with net output of ~3L since admission. However, appears that she still has fluid to give. She is being switched to PO diuresis today. - Please continue to  monitor I/Os closely and ensure continued net negative. - Please check daily BMP to continue to track trend as she diureses. - If Cr continues to worsen despite diuresis, she will likely need placement of central venous catheter for dialysis. In that case, please call Dr. Elwyn Lade group to initiate. Pt is agreeable to initiate if needed. - If Cr stabilizes/improves as we diurese with improvement in symptoms, then would discharge home with close f/u in nephrology clinic.  We will continue to follow along until one of the above situations happens. I have ordered a BMP for today as one has not yet been drawn.  Thank you.   Electronic Signatures: Clarice Pole (MD)  (Signed 06-Apr-15 09:57)  Authored: General Aspect/Present Illness, Home Medications, Allergies, History and Physical Exam, Vital Signs, Labs, Radiology, Impression/Plan   Last Updated: 06-Apr-15 09:57 by Clarice Pole (MD)

## 2014-07-23 NOTE — H&P (Signed)
PATIENT NAME:  Kimberly Montgomery, Kimberly Montgomery MR#:  [id number] DATE OF BIRTH:  May 29, 1965  DATE OF ADMISSION:  07/03/2013  ADMITTING PHYSICIAN: Enid Baas, MD.   PRIMARY CARE PHYSICIAN: At [facility].  PRIMARY NEPHROLOGIST: Dr. Austin Miles from [facility].   CHIEF COMPLAINT: Difficulty breathing and chest pain.   HISTORY OF PRESENT ILLNESS: Kimberly Montgomery is a 49 year old Caucasian female with past medical history significant for coronary artery disease, status post stent placement at [facility], history of congestive heart failure unknown ejection fraction, diabetes mellitus, diabetic neuropathy status post right leg BKA, hypertension, hyperlipidemia and chronic kidney disease stage IV presents to the hospital secondary to worsening breathing and also chest pain that started last night. The patient said her best friend was getting married, so she was busy with all the rehearsals and got worn out yesterday.  She slept on the sofa and woke up around 11:00 last night. When she woke up, she could not move because she could not breathe. She felt like something was choking her and presented to the hospital. Her left leg has been swelling gradually for the past couple of weeks. She is on 80 of Lasix b.i.d. and also metolazone at home. She felt like her urination has been decreased tremendously and in spite of taking the diuretic, she is not able to urinate. She complains of chest pain, which is more pleuritic and also musculoskeletal in nature, more of soreness and tender to touch. No pressure-like pain. Her troponins are negative. Chest x-ray showing pulmonary edema and lab showing worsening of her renal failure; so she is being admitted for the same.   PAST MEDICAL HISTORY: 1.  Coronary artery disease, status post stent placement.  2.  Congestive heart failure, unknown ejection fraction. 3.  CKD stage IV, baseline GFR of 21 according to the patient.  4.  Diabetes mellitus.  5.  Diabetic neuropathy.  6.  Hypertension.  7.   Hyperlipidemia.  8.  Obesity.  9.  Depression.   PAST SURGICAL HISTORY: 1.  Tubal ligation.  2.  Removal of fallopian tube and also ovary secondary to endometriosis. 3.  Right leg BKA for diabetic foot ulcer.   ALLERGIES TO MEDICATIONS: PENICILLIN AND CEPHALOSPORINS.  CURRENT HOME MEDICATIONS: 1.  Percocet 5/325 mg 1 tablet q. 4 hours p.r.n. for pain.  2.  Norvasc 10 mg p.o. daily.  3.  Aspirin 81 mg p.o. daily.  4.  Bupropion 300 mg in the morning and 150 mg in the afternoon.  5.  Plavix 75 mg p.o. daily.  6.  Colace 100 mg p.o. b.i.d. p.r.n. for constipation.  7.  Ferrous sulfate 325 mg p.o. daily.  8.  Flonase nasal spray daily.  9.  Lasix 80 mg p.o. b.i.d.  10.  Gabapentin 600 mg p.o. 3 times a day.  11.  Hydralazine 100 mg p.o. 3 times a day.  12.  Ibuprofen 600 mg p.o. q. 6 hours p.r.n. for pain.  13.  Imdur 30 mg p.o. daily.  14.  Lantus 65 units at bedtime.  15.  Metolazone 5 mg p.o. daily.  16.  Toprol 100 mg p.o. daily.  17.  NovoLog sliding scale insulin.  18.  Prilosec 40 mg p.o. b.i.d.  19.  Senna tablet 1 tablet p.o. b.i.d. p.r.n. for constipation.  20.  Tramadol 50 mg p.o. q. 6 hours p.r.n. for pain.  21.  Vitamin D2 50,000 international units capsule once every 3 months.  22.  Vytorin 10/40 mg p.o. daily.  23.  Zofran 8 mg oral disintegrating tablet q. 6 hours p.r.n. for nausea, vomiting.   SOCIAL HISTORY: Lives at home by herself. Continues to smoke about 1/2 pack per day. Denies any alcohol abuse or drug use.   FAMILY HISTORY: Positive for heart disease in father and also mom with COPD.  REVIEW OF SYSTEMS:  CONSTITUTIONAL: No fever, fatigue or weakness.  EYES: No blurred vision, double vision, inflammation or glaucoma.  ENT: No tinnitus, ear pain, hearing loss, epistaxis or discharge.  RESPIRATORY: No cough, wheeze, hemoptysis or COPD. Positive dyspnea on exertion.  CARDIOVASCULAR: Positive for chest pain, dyspnea on exertion. No palpitations or syncope.   GASTROINTESTINAL: No nausea, vomiting, diarrhea, abdominal pain, hematemesis or melena.  GENITOURINARY: No dysuria, hematuria, complaint of oliguria and anuria. No frequency or incontinence.  ENDOCRINE: No polyuria, nocturia, thyroid problems, heat or cold intolerance.  HEMATOLOGY: No anemia, easy bruising or bleeding.  SKIN: No acne, rash or lesions.  MUSCULOSKELETAL: Positive for arthritis and also leg, back, shoulder pain.  No gout.  NEUROLOGIC: No numbness, weakness, CVA, TIA or seizures. PSYCHIATRIC:  No anxiety, insomnia or depression.   PHYSICAL EXAMINATION: VITAL SIGNS: Temperature 98.2 degrees Fahrenheit, pulse 75, respirations 18, blood pressure 152/75, pulse ox 94% on room air.  GENERAL: Heavily-built, well-nourished female lying in bed, not in any acute distress.  HEENT: Normocephalic, atraumatic. Pupils equal, round, reacting to light. Anicteric sclerae. Extraocular movements intact. Oropharynx clear without erythema, mass or exudate. NECK: Supple. No thyromegaly, JVD or carotid bruits. No lymphadenopathy. Normal range of motion without any pain.  LUNGS: Moving air bilaterally. Fine bibasilar crackles heard. No rhonchi or wheeze. No use of accessory muscles for breathing.  CARDIOVASCULAR: S1, S2, regular rate and rhythm, 2/6 systolic murmur heard. No rubs or gallops.  ABDOMEN: Obese, soft, nontender, nondistended. No hepatosplenomegaly. Normal bowel sounds.  EXTREMITIES: Right leg is BKA and has a prosthetic leg.  Left leg has 3+ pedal edema, unable to palpate dorsalis pedis pulses.  SKIN: No acne, rash or lesions.  LYMPHATICS: No cervical or inguinal lymphadenopathy.  NEUROLOGIC: Cranial nerves II through XII remain grossly intact. No focal motor or sensory deficits. PSYCHIATRIC:  The patient is awake, alert, oriented x 3.   LABORATORY DATA: WBC 10.9, hemoglobin 8.7, hematocrit 26.0, platelet count 287.   Sodium 135, potassium 4.6, chloride 104, bicarb 24, BUN 51, creatinine  3.75, blood glucose 242 and calcium of 7.3. Troponin less than 0.2.  Chest x-ray showing mild vascular congestion and cardiomegaly. Bibasilar opacities reflect atelectasis. Ultrasound of kidneys  showing no acute abnormality noted.  CK, CK-MB and troponin are negative. Echo Doppler is done and is pending. D-dimer is within normal limits at 594.  BNP is elevated at 3510 and HbA1c is 11.7. EKG showing normal sinus rhythm. No acute ST wave abnormalities.   ASSESSMENT AND PLAN: A 49 year old female with hypertension, diabetes, diabetic neuropathy, chronic kidney disease stage IV, anemia and congestive heart failure admitted for acute on chronic congestive heart failure exacerbation  and acute renal failure. 1.  Acute on chronic congestive heart failure exacerbation.  Continue diuresis with Lasix IV. At this time, hold metolazone. Oxygen support as needed. Echocardiogram is pending.  Cardiology has been consulted.  Continue aspirin, metoprolol and statin.  2.  Acute renal failure and chronic kidney disease stage IV. Follows with [facility] nephrology. Last known GFR is 21 according to patient. Now is 13.  Also complains of oliguria. She was taking Ibuprofen at home, so hold nephrotoxins. likely cause acute tubular necrosis.  Continue to monitor with dialysis. [facility] nephrology has been contacted.  The patient was notified of dialysis if kidney function gets worse and she is okay with that.  Renal ultrasound is normal. 3.  Coronary artery disease, status post stents. Monitor troponins. Chest pain is pleuritic and musculoskeletal in nature. Will give as needed for morphine.  4.  Insulin-dependent diabetes mellitus. Continue Lantus and sliding scale insulin.  5.  Diabetic neuropathy, status post right below-knee amputation on gabapentin and Percocet as needed.  6.  Anemia of chronic disease. Her baseline hemoglobin is around 8 to 9.  It is 8.7. Not sure hemoglobin from yesterday is the right value.  She has anemia of  chronic disease and continue to monitor at this time. No indication for transfusion.  7.  Deep vein thrombosis prophylaxis with subcutaneous heparin.  CODE STATUS:  Full code.  TIME SPENT ON ADMISSION: 50 minutes.    ____________________________ Enid Baas, MD rk:ce D: 07/03/2013 12:41:46 ET T: 07/03/2013 14:23:10 ET JOB#: [id number]  cc: Enid Baas, MD, <Dictator> Enid Baas MD ELECTRONICALLY SIGNED 07/20/2013 13:27

## 2014-07-23 NOTE — Discharge Summary (Signed)
Dates of Admission and Diagnosis:  Date of Admission 27-Oct-2013   Date of Discharge 29-Oct-2013   Admitting Diagnosis peritonitis- abd pain   Final Diagnosis peritonitis- secondary to peritoneal dialysis catheter. ESRD on HD now CAD Hx.    Chief Complaint/History of Present Illness a 49 year old female with known history of end stage renal dialysis, with recent diagnosis started on hemodialysis Tuesday, Thursday, Saturday. As well, the patient has peritoneal dialysis catheter in anticipation of peritoneal hemodialysis, history of coronary artery disease, right leg BKA, hypertension, diabetes, congestive heart failure. The patient presents with complaints of abdominal pain, nausea, vomiting for 1 day. The patient reports these symptoms have been mild over the last couple of days but worsened over the last 24 hours. The patient reports she was diagnosed with peritonitis/intraabdominal infection as she had peritoneal culture, was positive. The patient cannot recall which species, but reports she has been on IV vancomycin for peritonitis over the last 1 month. She denies any fever, but reports chills. The patient was afebrile here, had no leukocytosis. No nausea, vomiting here in the Emergency Department. A CT abdomen and pelvis did not show any acute findings. The patient had blood cultures sent, and started on IV vancomycin and levofloxacin in the ED, and hospitalist requested to admit the patient. The patient had recently ED visit on July 7th for similar complaints with negative CAT scan where she was discharged home as well. The patient had been on Cipro recently for UTI. Urinalysis has been negative. The patient denies any chest pain, any shortness of breath, any dysuria or polyuria, any coffee-ground emesis or bright red blood per rectum or diarrhea or constipation.   Allergies:  Penicillin: Unknown  Keflex: Unknown  Shellfish: Unknown  Hepatic:  27-Jul-15 23:00   Albumin, Serum  1.7   Bilirubin, Total 0.2  Alkaline Phosphatase  126 (46-116 NOTE: New Reference Range 10/19/13)  SGPT (ALT)  12 (14-63 NOTE: New Reference Range 10/19/13)  SGOT (AST)  14  Total Protein, Serum  5.7  30-Jul-15 09:44   Albumin, Serum  1.4  TDMs:  30-Jul-15 09:44   Gentamicin, Trough LAB  3.9 (Result(s) reported on 28 Oct 2013 at 02:16PM.)  Routine Chem:  27-Jul-15 23:00   Glucose, Serum  356  BUN  27  Creatinine (comp)  3.15  Sodium, Serum 139  Potassium, Serum  3.3  Chloride, Serum 100  CO2, Serum 26  Calcium (Total), Serum 9.6  Anion Gap 13  Osmolality (calc) 297  eGFR (African American)  19  eGFR (Non-African American)  16 (eGFR values <42m/min/1.73 m2 may be an indication of chronic kidney disease (CKD). Calculated eGFR is useful in patients with stable renal function. The eGFR calculation will not be reliable in acutely ill patients when serum creatinine is changing rapidly. It is not useful in  patients on dialysis. The eGFR calculation may not be applicable to patients at the low and high extremes of body sizes, pregnant women, and vegetarians.)  Lipase 95 (Result(s) reported on 26 Oct 2013 at 12:40AM.)  29-Jul-15 04:43   Glucose, Serum  252  BUN 17  Creatinine (comp)  3.41  Sodium, Serum 136  Potassium, Serum  3.4  Chloride, Serum 98  CO2, Serum 30  Calcium (Total), Serum  8.0  Anion Gap 8  Osmolality (calc) 282  eGFR (African American)  17  eGFR (Non-African American)  15 (eGFR values <665mmin/1.73 m2 may be an indication of chronic kidney disease (CKD). Calculated eGFR is useful in patients with stable  renal function. The eGFR calculation will not be reliable in acutely ill patients when serum creatinine is changing rapidly. It is not useful in  patients on dialysis. The eGFR calculation may not be applicable to patients at the low and high extremes of body sizes, pregnant women, and vegetarians.)  Magnesium, Serum  1.7 (1.8-2.4 THERAPEUTIC RANGE: 4-7  mg/dL TOXIC: > 10 mg/dL  -----------------------)  30-Jul-15 09:44   Glucose, Serum  294  BUN  27  Creatinine (comp)  4.67  Sodium, Serum  132  Potassium, Serum  3.3  Chloride, Serum  94  CO2, Serum 29  Calcium (Total), Serum  7.5  Phosphorus, Serum 3.6  Anion Gap 9  Osmolality (calc) 280  eGFR (African American)  12  eGFR (Non-African American)  10 (eGFR values <72m/min/1.73 m2 may be an indication of chronic kidney disease (CKD). Calculated eGFR is useful in patients with stable renal function. The eGFR calculation will not be reliable in acutely ill patients when serum creatinine is changing rapidly. It is not useful in  patients on dialysis. The eGFR calculation may not be applicable to patients at the low and high extremes of body sizes, pregnant women, and vegetarians.)  Cardiac:  28-Jul-15 12:36   Troponin I  0.17 (0.00-0.05 0.05 ng/mL or less: NEGATIVE  Repeat testing in 3-6 hrs  if clinically indicated. >0.05 ng/mL: POTENTIAL  MYOCARDIAL INJURY. Repeat  testing in 3-6 hrs if  clinically indicated. NOTE: An increase or decrease  of 30% or more on serial  testing suggests a  clinically important change)    14:59   Troponin I  0.13 (0.00-0.05 0.05 ng/mL or less: NEGATIVE  Repeat testing in 3-6 hrs  if clinically indicated. >0.05 ng/mL: POTENTIAL  MYOCARDIAL INJURY. Repeat  testing in 3-6 hrs if  clinically indicated. NOTE: An increase or decrease  of 30% or more on serial  testing suggests a  clinically important change)    20:44   Troponin I  0.09 (0.00-0.05 0.05 ng/mL or less: NEGATIVE  Repeat testing in 3-6 hrs  if clinically indicated. >0.05 ng/mL: POTENTIAL  MYOCARDIAL INJURY. Repeat  testing in 3-6 hrs if  clinically indicated. NOTE: An increase or decrease  of 30% or more on serial  testing suggests a  clinically important change)  Routine Hem:  27-Jul-15 23:00   WBC (CBC) 8.3  RBC (CBC) 5.16  Hemoglobin (CBC) 13.6  Hematocrit (CBC)  42.3  Platelet Count (CBC) 288 (Result(s) reported on 26 Oct 2013 at 12:34AM.)  MCV 82  MCH 26.3  MCHC 32.1  RDW  14.7  29-Jul-15 04:43   WBC (CBC)  12.6  RBC (CBC) 4.17  Hemoglobin (CBC)  11.0  Hematocrit (CBC)  34.2  Platelet Count (CBC) 237  MCV 82  MCH 26.4  MCHC 32.2  RDW  14.8  Neutrophil % 75.6  Lymphocyte % 17.7  Monocyte % 5.4  Eosinophil % 0.9  Basophil % 0.4  Neutrophil #  9.5  Lymphocyte # 2.2  Monocyte # 0.7  Eosinophil # 0.1  Basophil # 0.0 (Result(s) reported on 27 Oct 2013 at 05:10AM.)  30-Jul-15 09:44   WBC (CBC) 8.7  RBC (CBC)  3.74  Hemoglobin (CBC)  10.0  Hematocrit (CBC)  31.0  Platelet Count (CBC) 197  MCV 83  MCH 26.6  MCHC 32.1  RDW  14.8  Neutrophil % 66.4  Lymphocyte % 22.3  Monocyte % 8.5  Eosinophil % 2.3  Basophil % 0.5  Neutrophil # 5.8  Lymphocyte #  1.9  Monocyte # 0.7  Eosinophil # 0.2  Basophil # 0.0 (Result(s) reported on 28 Oct 2013 at 11:58AM.)   Pertinent Past History:  Pertinent Past History 1. Coronary artery disease, status post stent.  2. Congestive heart failure.  3. End-stage renal disease on hemodialysis.  4. Diabetes mellitus.  5. Diabetic neuropathy.  6. Hypertension.  7. Hyperlipidemia.  8. Obesity.  9. Depression.   Hospital Course:  Hospital Course 49 y/o f with midepigastric  Abd pain, N/V, ESRd on HD, as well has peritoneal dialysis cath, reports peritonitis/infection, on IV vanc during HD X 1 month  1-Abd pain: CT ABD negative for acute changes  having midepigastric pain but nml lipase  denies  melena/hematochezia ongoing PD cath infection repeatedly. cont ABX- on vanc- added gentamycin for intra abd coverage. need for 2 weeks with HD. blood cx NTD- Peritoneal fluid shows WBCs- that too on being Abx- so confirms infection.   spoke to Dr. Candiss Norse- cont Abx for 2 weeks. PPI Appreciated GI consult. treat gastroparesis with antiemetics and pain control.  2. ESRD on HD has PD cathetar as well with  current perotiniits on IV VANC for one month renal consulted- reviewed peritoneal fluid samples.  3. DM cont SSI, and detemir.  4.hx CAD/stents   Condition on Discharge Stable   Code Status:  Code Status Full Code   DISCHARGE INSTRUCTIONS HOME MEDS:  Medication Reconciliation: Patient's Home Medications at Discharge:     Medication Instructions  fluticasone nasal 50 mcg/inh nasal spray  1 spray(s) nasal once a day   bupropion 150 mg/12 hours oral tablet, extended release  2 tabs (380m) orally in morning and 1 tab orally once a day in the afternoon.   hydralazine 100 mg oral tablet  1 tab(s) orally 3 times a day   omeprazole 40 mg oral delayed release capsule  1 cap(s) orally 2 times a day   senna lax 8.6 mg oral tablet  1 tab(s) orally 2 times a day, As Needed   gabapentin 300 mg oral capsule  2 cap(s) orally 3 times a day   vytorin 10 mg-40 mg oral tablet  1 tab(s) orally once a day   novolog 100 units/ml subcutaneous solution  use as directed subcutaneous as needed. sliding scale   colace 100 mg oral capsule  1 cap(s) orally 2 times a day, As Needed - for Constipation   torsemide 20 mg oral tablet  4 tab(s) orally 2 times a day   calcium acetate 667 mg oral capsule  2 cap(s) orally 3 times a day (with meals)   polyethylene glycol 3350 oral powder for reconstitution  17 gram(s) orally once a day, As needed, constipation   amlodipine 5 mg oral tablet  1 tab(s) orally once a day   reglan 10 mg oral tablet  1 tab(s) orally 4 times a day (before meals and at bedtime)   phenergan  25 mg PO every 6 hours PRN Nausea     insulin glargine 100 units/ml subcutaneous solution  35 unit(s) subcutaneous once a day (at bedtime)   gentamicin  120 milligram(s) intravenous once- after each dialysis sesson for 2 weeks   acetaminophen 325 mg oral tablet  2 tab(s) orally every 4 hours, As needed, mild pain (1-3/10) or temp. greater than 100.4   vancomycin  1000 milligram(s) intravenous once- after  each dialysis for 2 weeks.   nicotine 21 mg/24 hr transdermal film, extended release  1 patch transdermal once a day  STOP TAKING THE FOLLOWING MEDICATION(S):    phenergan suppositories 103m: 1 SUPP(s) PR every 6 hours PRN Nausea    Physician's Instructions:  Diet Renal Diet   Activity Limitations As tolerated   Return to Work Not Applicable   Time frame for Follow Up Appointment 1-2 weeks  Dr. DLucky Cowboy  Other Comments routine HD with Dr. SBerlinda Lastin vascular clinic in 1 week.     SCandiss Norse Harmeet(Consultant): [facility], [address] [place] [address] [address]  Nonlocal MD, MD(Family Physician):   Electronic Signatures: VVaughan Basta(MD)  (Signed 31-Jul-15 15:15)  Authored: ADMISSION DATE AND DIAGNOSIS, CHIEF COMPLAINT/HPI, Allergies, PERTINENT LABS, PERTINENT PAST [place] PATIENT INSTRUCTIONS, Follow Up Physician   Last Updated: 31-Jul-15 15:15 by VVaughan Basta(MD)

## 2014-07-31 NOTE — Discharge Summary (Signed)
PATIENT NAME:  Kimberly Montgomery, SQUYRES MR#:  [id number] DATE OF BIRTH:  [birth year]  DATE OF ADMISSION:  07/16/2014 DATE OF DISCHARGE:  07/18/2014  ADMISSION DIAGNOSES:  1. Peritonitis.  2. End-stage renal disease on peritoneal dialysis.   DISCHARGE DIAGNOSES: 1. Tunneled catheter.  2. End-stage renal disease on hemodialysis.  3. Essential hypertension.  4. Diabetes.   CONSULTATIONS:  Nephrology.   PHYSICAL EXAMINATION: VITAL SIGNS: Temperature 98.8. Pulse is 67, respirations 20, blood pressure 108/66, and 97 on room air.  GENERAL: The patient is alert, oriented, no acute distress.   CARDIOVASCULAR: Regular rate and rhythm. No murmur, gallops, or rubs.  ABDOMEN:  Bowel sounds are positive. Nontender.  Peritoneal dialysis catheter was not infected.  EXTREMITIES: No clubbing, cyanosis, or edema.   HOSPITAL COURSE: This is a 49 year old female who presented on April 17 with abdominal pain. For further details, please refer to the H and P.  1. PD catheter tunnel infection. The patient was started on broad spectrum antibiotics for possible peritonitis. She had fluid drawn which was not indicative of peritonitis. Her WBCs were less than 30, which again is not indicative of peritonitis and we think she has a peritoneal dialysis catheter tunnel infection. She was placed on IV antibiotics and then p.o. antibiotics.  2. End-stage renal disease on PD.  Appreciate nephrology's consultation.  3. Hypertension. The patient's blood pressure was low.  She will need close followup as an outpatient.  4. Diabetes. The patient will resume her outpatient medications.  5. Gastroparesis. Continue outpatient medications.   DISCHARGE MEDICATIONS: 1. Fluticasone 50 mcg 1 spray daily.  2. Omeprazole 40 mg b.i.d.  3. Calcium acetate 2 tablets 3 times a day.  4. Norvasc 10 mg daily.  5. Bupropion 150 daily.  6. Docusate 100 mg b.i.d.  7. NovoLog t.i.d.  8. Aspirin 81 mg daily.  9. Senna 1 tablet b.i.d.  10. Plavix  75 mg daily.  11. Tramadol 50 mg q. 6 hours p.r.n. pain.  12. Vytorin 10/40 daily.  13. Gabapentin 300 mg t.i.d.  14. Lantus 55 units at bedtime.  15. MiraLax 51 gm daily.  16. Erythromycin 250 mg q. 6 hours 4 times a day.  17. Bupropion 150 b.i.d.  18. Bactrim 1 tablet p.o. daily for 10 days.   Patient's is holding hydralazine, metoprolol, torsemide and metolazone until followup for her blood pressure.   DISCHARGE DIET: Renal diet.   DISCHARGE ACTIVITY: As tolerated.   DISCHARGE FOLLOWUP:  The patient will follow up in 1 week with nephrology.  She has instructions to monitor her blood pressure daily and may restart blood pressure medications once greater than 130/80.   The patient was stable for discharge.    TIME SPENT: Approximately 35 minutes.    ____________________________ Janyth Contes. Juliene Pina, MD spm:tr D: 07/22/2014 14:26:42 ET T: 07/22/2014 16:06:37 ET JOB#: [id number]  cc: Kerryn Tennant P. Juliene Pina, MD, <Dictator> [facility]phrology Hilary Milks P Ferrell Claiborne MD ELECTRONICALLY SIGNED 07/24/2014 20:30

## 2014-07-31 NOTE — H&P (Signed)
PATIENT NAME:  Kimberly Montgomery, Kimberly Montgomery MR#:  [id number] DATE OF BIRTH:  1965-10-06  DATE OF ADMISSION:  07/16/2014   CHIEF COMPLAINT: Abdominal pain.   HISTORY OF PRESENT ILLNESS: This is a 48 year old female with a significant past medical history of end-stage renal disease on peritoneal dialysis who came to the ED for abdominal pain. States that she has been having upper abdominal pain, as well as some bilateral flank pain, and  a little soreness around her PD catheter for a few days now. States that she took a shower 2 nights ago and noticed that she had some crusty material on her bandage around her PD catheter insertion site. After the shower she noticed a "blister" right at the insertion site and it then ruptured she saw what she thought was some pus-like material coming out. She has not had any of this since that occurrence; however, she has had progressive abdominal pain. She has had some nausea for about 5 days now with some decreased appetite and vomiting for the past 2 days and really bad pain, worse in her abdomen since yesterday. She went to her nephrologists' office yesterday. She sees Dr. Glenna Fellows at [facility]. The PD nurse there drew some PD fluid, sent it for culture, and give her some antibiotics, exchanged her PD fluids, but told her to make sure that she saw someone immediately if her symptoms were getting worse. The patient's pain has continued to increase so she came to the ED. She has had chills she states; measured temperatures only up to about 99, but she has had shaking chills (several episodes) over the last couple of days. The patient also has a history of gastroparesis and states that she has had a lot of difficulty with this in the past until she got on erythromycin which helped her up to control her gastroparesis symptoms a lot; however, recently she has had a little bit decreased appetite and for the past couple of weeks has not been taking her erythromycin as much. Notably, the patient also has  a history of CAD and last week she did have some anginal pain, but this resolved. It was 1 episode, self-limited, and troponins on ED evaluation today were negative. In the ED, the patient was found to have a very mildly elevated white count and not much else truly significantly abnormal as in relation to her symptoms on ED evaluation; however, given the course and her history, hospitalists were contacted for admission for likely peritonitis. She was given more antibiotics in the ED and peritoneal fluid cultures were ordered to be sent.   PRIMARY CARE PHYSICIAN: Nonlocal.   PAST MEDICAL HISTORY: CAD, CHF, hypertension, type 2 diabetes, asthma, endometriosis, end-stage renal disease on peritoneal dialysis, gastroparesis.   CURRENT MEDICATIONS: Vytorin 10/40 mg daily, tramadol 50 mg q. 6 hours p.r.n. (patient states she takes this infrequently), torsemide 100 mg daily, senna p.r.n., omeprazole 40 mg b.i.d., NovoLog insulin 5, 10 or 15 units t.i.d. with meals, MiraLax p.r.n., metoprolol tartrate 25 mg daily, metolazone 5 mg daily p.r.n., Lantus 55 units at bedtime, hydralazine 100 mg t.i.d., gabapentin 300 mg t.i.d., Flonase, Plavix 75 mg daily, calcium acetate 667 mg 2 capsules t.i.d. with meals, bupropion 150 mg 2 tablets in the morning, 1 tablet in the evening, aspirin 81 mg daily, amlodipine 10 mg daily, erythromycin 250 mg p.o. q.i.d. with meals.   PAST SURGICAL HISTORY: Hysterectomy, right below the knee amputation, PD tube insertion and reinsertion, HD fistula surgery and then HD fistula reversal,  and cardiac stents.   ALLERGIES: PHENERGAN, PENICILLIN, KEFLEX, SHELLFISH.   FAMILY HISTORY: CAD, diabetes mellitus, bipolar, cancer; she states mostly cervical cancer on her mother's side.   SOCIAL HISTORY: She is a current every day smoker, smoking 3/4 pack per day for the last 32 years. Denies alcohol or illicit drug use.   REVIEW OF SYSTEMS:  CONSTITUTIONAL: No fevers. She does endorse some fatigue  and weakness, as well as some chills.  EYES: No blurred or double vision, pain or redness.  EAR, NOSE, THROAT: No ear pain, hearing loss, or difficulty swallowing.  RESPIRATORY: No cough, dyspnea, or painful respiration.  CARDIOVASCULAR: The 1 episode of angina 1 week ago which was self-limited; otherwise, no chest pain. She has some trace edema. Denies palpitations.  GASTROINTESTINAL: Endorses nausea and vomiting. Denies diarrhea. Endorses abdominal pain. Denies constipation. Denies hematemesis.  GENITOURINARY: Denies dysuria, hematuria, or frequency.  ENDOCRINE: Denies nocturia, thyroid problems, heat or cold intolerance.  HEMATOLOGIC AND LYMPHATIC: Denies easy bruising, bleeding, or swollen glands.  INTEGUMENTARY: Denies acne, rash, or lesions.  MUSCULOSKELETAL: Denies acute arthritis, joint swelling, or gout.  NEUROLOGICAL: Denies numbness, weakness, or headache.  PSYCHIATRIC: Denies anxiety, insomnia, or depression.   PHYSICAL EXAMINATION:  VITAL SIGNS: Blood pressure 213/92 initially in the ED and then later 188/88, pulse 96, temperature 98.9, respirations 18.  GENERAL: This is a well-nourished female lying supine in bed in no acute respiratory distress.  HEENT: Pupils equal, round, and reactive to light and accommodation. Extraocular movements are intact. No scleral icterus. Moist mucosal membranes.  NECK: Thyroid is not enlarged. Neck is supple. No masses, nontender. No cervical adenopathy. No JVD.  RESPIRATORY: Clear to auscultation bilaterally. No rales, rhonchi, or wheezes. No respiratory distress.  CARDIOVASCULAR: Regular rate and rhythm. No murmurs, rubs, or gallops on exam. Good pedal pulse in her left foot and trace lower extremity edema.  ABDOMEN: Soft. Tender in the epigastric and upper region, nondistended with hypoactive bowel sounds. PD catheter in place. Insertion site is not erythematous and has no drainage.   MUSCULOSKELETAL: With 5/5 muscular strength in all 4  extremities. 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 Otherwise, she has full spontaneous range of motion throughout with no cyanosis or clubbing.  SKIN: No rash or lesions. Skin is warm, dry, and intact.  LYMPHATIC: No adenopathy.  NEUROLOGICAL: Cranial nerves intact. Sensation intact throughout. No dysarthria or aphasia.  PSYCHIATRIC: Alert and oriented x 3, cooperative, not confused or agitated.   LABORATORY DATA: White count 11.5, hemoglobin 12.3, hematocrit 37.9, platelets 334,000. Sodium 133, potassium 4.4, chloride 102, CO2 is 21, BUN 31, creatinine 3.3, glucose 471, calcium 8.4, lipase 27, total protein 5.7, albumin 2.6, total bilirubin 0.4, alkaline phosphatase 104, AST 14, ALT 9. Troponin less than 0.03.   RADIOLOGY: No radiologic studies this admission so far.   ASSESSMENT AND PLAN:  1.  Peritonitis: Given patient's shaking chills and her abdominal symptoms with peritoneal dialysis catheter, there is always concern for possible peritonitis. She was given antibiotics yesterday at her nephrologists' office and also today in the Emergency Department. We will continue these antibiotics for now. We will obtain peritoneal fluid cultures as ordered and get a nephrology consult for further recommendations.  2.  Gastroparesis: There is some question as to whether or not these abdominal symptoms are coming from true peritonitis or potentially worsening gastroparesis as the patient has not been able to take her erythromycin. We will give her intravenous erythromycin for now to  see if this improves her gastroparesis. We will give her intravenous Zofran to control her nausea and allow oral intake as tolerated.  3.  End-stage renal disease on peritoneal dialysis: We will get a nephrology consult for assistance with her dialysis and other treatments for potential peritonitis as above. She will be given a diabetic dialysis diet.  4.  Hypertension: Her blood pressure is  definitely elevated, though she states she has not taken any of her antihypertensive medications today. We will make sure she has those medications and can get them while she is intravenous p.r.n. antihypertensive medications.  5.  Diabetes mellitus type 2: We will reduce her Lantus dose to 30 units. She is currently hyperglycemic and given the fact that we are not discontinuing her diet, we will continue her Lantus, but we will reduce the dose as her diet here in the hospital may be more limited. We will do sliding scale insulin as well.  6.  Coronary artery disease: She was troponin negative in the Emergency Department here, so we will continue her cardiac appropriate home medications at this time.  7.  Deep vein thrombosis prophylaxis: Subcutaneous heparin.  CODE STATUS: This patient is a FULL CODE.   TIME SPENT ON THIS ADMISSION: 50 minutes.    ____________________________ Candace Cruise. Anne Hahn, MD dfw:TT D: 07/17/2014 00:40:53 ET T: 07/17/2014 01:47:03 ET JOB#: [id number]  cc: Candace Cruise. Anne Hahn, MD, <Dictator> Gaylia Kassel Scotty Court MD ELECTRONICALLY SIGNED 07/17/2014 3:39

## 2015-04-02 DIAGNOSIS — I219 Acute myocardial infarction, unspecified: Secondary | ICD-10-CM

## 2015-04-02 HISTORY — DX: Acute myocardial infarction, unspecified: I21.9

## 2015-05-12 ENCOUNTER — Emergency Department: Payer: Medicare HMO

## 2015-05-12 ENCOUNTER — Inpatient Hospital Stay
Admission: EM | Admit: 2015-05-12 | Discharge: 2015-05-16 | DRG: 291 | Disposition: A | Discharge status: Home / Self Care | Payer: Medicare HMO | Attending: Internal Medicine | Admitting: Internal Medicine

## 2015-05-12 DIAGNOSIS — E875 Hyperkalemia: Secondary | ICD-10-CM | POA: Diagnosis present

## 2015-05-12 DIAGNOSIS — R079 Chest pain, unspecified: Secondary | ICD-10-CM | POA: Diagnosis present

## 2015-05-12 DIAGNOSIS — Z880 Allergy status to penicillin: Secondary | ICD-10-CM

## 2015-05-12 DIAGNOSIS — Z833 Family history of diabetes mellitus: Secondary | ICD-10-CM | POA: Diagnosis not present

## 2015-05-12 DIAGNOSIS — J45909 Unspecified asthma, uncomplicated: Secondary | ICD-10-CM | POA: Diagnosis present

## 2015-05-12 DIAGNOSIS — Z89519 Acquired absence of unspecified leg below knee: Secondary | ICD-10-CM | POA: Diagnosis not present

## 2015-05-12 DIAGNOSIS — E1122 Type 2 diabetes mellitus with diabetic chronic kidney disease: Secondary | ICD-10-CM | POA: Diagnosis present

## 2015-05-12 DIAGNOSIS — Z79899 Other long term (current) drug therapy: Secondary | ICD-10-CM | POA: Diagnosis not present

## 2015-05-12 DIAGNOSIS — R0989 Other specified symptoms and signs involving the circulatory and respiratory systems: Secondary | ICD-10-CM

## 2015-05-12 DIAGNOSIS — Z992 Dependence on renal dialysis: Secondary | ICD-10-CM

## 2015-05-12 DIAGNOSIS — N2581 Secondary hyperparathyroidism of renal origin: Secondary | ICD-10-CM | POA: Diagnosis present

## 2015-05-12 DIAGNOSIS — Z7902 Long term (current) use of antithrombotics/antiplatelets: Secondary | ICD-10-CM

## 2015-05-12 DIAGNOSIS — K219 Gastro-esophageal reflux disease without esophagitis: Secondary | ICD-10-CM | POA: Diagnosis present

## 2015-05-12 DIAGNOSIS — Z8249 Family history of ischemic heart disease and other diseases of the circulatory system: Secondary | ICD-10-CM

## 2015-05-12 DIAGNOSIS — Z7982 Long term (current) use of aspirin: Secondary | ICD-10-CM | POA: Diagnosis not present

## 2015-05-12 DIAGNOSIS — J209 Acute bronchitis, unspecified: Secondary | ICD-10-CM | POA: Diagnosis present

## 2015-05-12 DIAGNOSIS — Z794 Long term (current) use of insulin: Secondary | ICD-10-CM | POA: Diagnosis not present

## 2015-05-12 DIAGNOSIS — Z955 Presence of coronary angioplasty implant and graft: Secondary | ICD-10-CM | POA: Diagnosis not present

## 2015-05-12 DIAGNOSIS — Z90710 Acquired absence of both cervix and uterus: Secondary | ICD-10-CM

## 2015-05-12 DIAGNOSIS — D631 Anemia in chronic kidney disease: Secondary | ICD-10-CM | POA: Diagnosis present

## 2015-05-12 DIAGNOSIS — N186 End stage renal disease: Secondary | ICD-10-CM | POA: Diagnosis present

## 2015-05-12 DIAGNOSIS — G839 Paralytic syndrome, unspecified: Secondary | ICD-10-CM | POA: Diagnosis present

## 2015-05-12 DIAGNOSIS — N179 Acute kidney failure, unspecified: Secondary | ICD-10-CM

## 2015-05-12 DIAGNOSIS — E785 Hyperlipidemia, unspecified: Secondary | ICD-10-CM | POA: Diagnosis present

## 2015-05-12 DIAGNOSIS — I5033 Acute on chronic diastolic (congestive) heart failure: Secondary | ICD-10-CM | POA: Diagnosis present

## 2015-05-12 DIAGNOSIS — I132 Hypertensive heart and chronic kidney disease with heart failure and with stage 5 chronic kidney disease, or end stage renal disease: Secondary | ICD-10-CM | POA: Diagnosis present

## 2015-05-12 DIAGNOSIS — F17200 Nicotine dependence, unspecified, uncomplicated: Secondary | ICD-10-CM | POA: Diagnosis present

## 2015-05-12 DIAGNOSIS — N189 Chronic kidney disease, unspecified: Secondary | ICD-10-CM

## 2015-05-12 DIAGNOSIS — R531 Weakness: Secondary | ICD-10-CM

## 2015-05-12 DIAGNOSIS — Z888 Allergy status to other drugs, medicaments and biological substances status: Secondary | ICD-10-CM | POA: Diagnosis not present

## 2015-05-12 HISTORY — DX: Heart failure, unspecified: I50.9

## 2015-05-12 HISTORY — DX: Disorder of kidney and ureter, unspecified: N28.9

## 2015-05-12 HISTORY — DX: Endometriosis, unspecified: N80.9

## 2015-05-12 HISTORY — DX: Gastroparesis: K31.84

## 2015-05-12 HISTORY — DX: Essential (primary) hypertension: I10

## 2015-05-12 LAB — BASIC METABOLIC PANEL
Anion gap: 6 (ref 5–15)
BUN: 67 mg/dL — AB (ref 6–20)
CHLORIDE: 112 mmol/L — AB (ref 101–111)
CO2: 20 mmol/L — ABNORMAL LOW (ref 22–32)
CREATININE: 6.65 mg/dL — AB (ref 0.44–1.00)
Calcium: 7.9 mg/dL — ABNORMAL LOW (ref 8.9–10.3)
GFR calc Af Amer: 8 mL/min — ABNORMAL LOW (ref >60)
GFR calc non Af Amer: 6 mL/min — ABNORMAL LOW (ref >60)
Glucose, Bld: 204 mg/dL — ABNORMAL HIGH (ref 65–99)
Potassium: 5.9 mmol/L — ABNORMAL HIGH (ref 3.5–5.1)
Sodium: 138 mmol/L (ref 135–145)

## 2015-05-12 LAB — TSH: TSH: 6.833 u[IU]/mL — AB (ref 0.350–4.500)

## 2015-05-12 LAB — GLUCOSE, CAPILLARY: Glucose-Capillary: 139 mg/dL — ABNORMAL HIGH (ref 65–99)

## 2015-05-12 LAB — CBC
HCT: 27.8 % — ABNORMAL LOW (ref 35.0–47.0)
Hemoglobin: 9.2 g/dL — ABNORMAL LOW (ref 12.0–16.0)
MCH: 27.9 pg (ref 26.0–34.0)
MCHC: 33.1 g/dL (ref 32.0–36.0)
MCV: 84.3 fL (ref 80.0–100.0)
PLATELETS: 326 10*3/uL (ref 150–440)
RBC: 3.3 MIL/uL — ABNORMAL LOW (ref 3.80–5.20)
RDW: 14.5 % (ref 11.5–14.5)
WBC: 8.3 10*3/uL (ref 3.6–11.0)

## 2015-05-12 LAB — TROPONIN I
Troponin I: <0.03 ng/mL (ref <0.031)
Troponin I: <0.03 ng/mL (ref <0.031)
Troponin I: 0.04 ng/mL — ABNORMAL HIGH (ref <0.031)

## 2015-05-12 MED ORDER — ONDANSETRON HCL 4 MG PO TABS: 4.0000 mg | ORAL_TABLET | Freq: Four times a day (QID) | ORAL | Status: DC | PRN

## 2015-05-12 MED ORDER — ALBUTEROL SULFATE (2.5 MG/3ML) 0.083% IN NEBU
2.5000 mg | INHALATION_SOLUTION | Freq: Four times a day (QID) | RESPIRATORY_TRACT | Status: DC | PRN
  Administered 2015-05-14: 2.5 mg via RESPIRATORY_TRACT
  Filled 2015-05-12: qty 3

## 2015-05-12 MED ORDER — SIMVASTATIN 40 MG PO TABS
40.0000 mg | ORAL_TABLET | Freq: Every day | ORAL | Status: DC
Start: 2015-05-13 — End: 2015-05-13

## 2015-05-12 MED ORDER — POLYETHYLENE GLYCOL 3350 17 G PO PACK
17.0000 g | PACK | Freq: Every day | ORAL | Status: DC | PRN
  Administered 2015-05-15: 17 g via ORAL
  Filled 2015-05-12: qty 1

## 2015-05-12 MED ORDER — CLOPIDOGREL BISULFATE 75 MG PO TABS
75.0000 mg | ORAL_TABLET | Freq: Every day | ORAL | Status: DC
  Administered 2015-05-12 – 2015-05-16 (×5): 75 mg via ORAL
  Filled 2015-05-12 (×5): qty 1

## 2015-05-12 MED ORDER — LISINOPRIL 10 MG PO TABS
20.0000 mg | ORAL_TABLET | Freq: Every evening | ORAL | Status: DC
  Administered 2015-05-12 – 2015-05-15 (×4): 20 mg via ORAL
  Filled 2015-05-12 (×4): qty 2

## 2015-05-12 MED ORDER — HEPARIN SODIUM (PORCINE) 5000 UNIT/ML IJ SOLN
5000.0000 [IU] | Freq: Three times a day (TID) | INTRAMUSCULAR | Status: DC
  Administered 2015-05-12 – 2015-05-16 (×11): 5000 [IU] via SUBCUTANEOUS
  Filled 2015-05-12 (×10): qty 1

## 2015-05-12 MED ORDER — MORPHINE SULFATE (PF) 4 MG/ML IV SOLN
4.0000 mg | Freq: Once | INTRAVENOUS | Status: AC
Start: 2015-05-12 — End: 2015-05-12
  Administered 2015-05-12: 4 mg via INTRAVENOUS
  Filled 2015-05-12: qty 1

## 2015-05-12 MED ORDER — ONDANSETRON HCL 4 MG/2ML IJ SOLN
4.0000 mg | Freq: Four times a day (QID) | INTRAMUSCULAR | Status: DC | PRN
  Administered 2015-05-13: 4 mg via INTRAVENOUS
  Filled 2015-05-12: qty 2

## 2015-05-12 MED ORDER — ERYTHROMYCIN BASE 250 MG PO TABS
250.0000 mg | ORAL_TABLET | Freq: Four times a day (QID) | ORAL | Status: DC
  Administered 2015-05-12 – 2015-05-16 (×15): 250 mg via ORAL
  Filled 2015-05-12 (×26): qty 1

## 2015-05-12 MED ORDER — TORSEMIDE 100 MG PO TABS
100.0000 mg | ORAL_TABLET | Freq: Every day | ORAL | Status: DC
  Administered 2015-05-12 – 2015-05-13 (×2): 100 mg via ORAL
  Filled 2015-05-12 (×2): qty 1

## 2015-05-12 MED ORDER — FUROSEMIDE 10 MG/ML IJ SOLN
40.0000 mg | Freq: Two times a day (BID) | INTRAMUSCULAR | Status: DC
  Administered 2015-05-12 – 2015-05-13 (×2): 40 mg via INTRAVENOUS
  Filled 2015-05-12 (×2): qty 4

## 2015-05-12 MED ORDER — ACETAMINOPHEN 500 MG PO TABS: 1000.0000 mg | ORAL_TABLET | Freq: Four times a day (QID) | ORAL | Status: DC | PRN

## 2015-05-12 MED ORDER — PANTOPRAZOLE SODIUM 40 MG PO TBEC
40.0000 mg | DELAYED_RELEASE_TABLET | Freq: Every day | ORAL | Status: DC
Start: 2015-05-12 — End: 2015-05-16
  Administered 2015-05-12 – 2015-05-16 (×5): 40 mg via ORAL
  Filled 2015-05-12 (×5): qty 1

## 2015-05-12 MED ORDER — IPRATROPIUM-ALBUTEROL 0.5-2.5 (3) MG/3ML IN SOLN
3.0000 mL | Freq: Once | RESPIRATORY_TRACT | Status: AC
Start: 2015-05-12 — End: 2015-05-13
  Administered 2015-05-13: 3 mL via RESPIRATORY_TRACT
  Filled 2015-05-12 (×2): qty 3

## 2015-05-12 MED ORDER — SODIUM CHLORIDE 0.9% FLUSH
3.0000 mL | Freq: Two times a day (BID) | INTRAVENOUS | Status: DC
  Administered 2015-05-13 – 2015-05-16 (×4): 3 mL via INTRAVENOUS

## 2015-05-12 MED ORDER — INSULIN ASPART 100 UNIT/ML ~~LOC~~ SOLN
0.0000 [IU] | Freq: Three times a day (TID) | SUBCUTANEOUS | Status: DC
  Administered 2015-05-13: 3 [IU] via SUBCUTANEOUS
  Administered 2015-05-13: 2 [IU] via SUBCUTANEOUS
  Administered 2015-05-14: 3 [IU] via SUBCUTANEOUS
  Administered 2015-05-15 – 2015-05-16 (×2): 1 [IU] via SUBCUTANEOUS
  Filled 2015-05-12: qty 3
  Filled 2015-05-12: qty 2
  Filled 2015-05-12 (×2): qty 1
  Filled 2015-05-12: qty 3

## 2015-05-12 MED ORDER — INSULIN ASPART 100 UNIT/ML ~~LOC~~ SOLN
15.0000 [IU] | Freq: Three times a day (TID) | SUBCUTANEOUS | Status: DC
  Administered 2015-05-13: 15 [IU] via SUBCUTANEOUS
  Filled 2015-05-12: qty 15

## 2015-05-12 MED ORDER — SODIUM POLYSTYRENE SULFONATE 15 GM/60ML PO SUSP
30.0000 g | Freq: Once | ORAL | Status: AC
  Administered 2015-05-12: 30 g via ORAL
  Filled 2015-05-12: qty 120

## 2015-05-12 MED ORDER — SODIUM CHLORIDE 0.9 % IV SOLN: 250.0000 mL | INTRAVENOUS | Status: DC | PRN

## 2015-05-12 MED ORDER — ACETAMINOPHEN 325 MG PO TABS: 650.0000 mg | ORAL_TABLET | Freq: Four times a day (QID) | ORAL | Status: DC | PRN

## 2015-05-12 MED ORDER — ASPIRIN-ACETAMINOPHEN-CAFFEINE 250-250-65 MG PO TABS
2.0000 | ORAL_TABLET | Freq: Four times a day (QID) | ORAL | Status: DC | PRN
  Filled 2015-05-12: qty 2

## 2015-05-12 MED ORDER — CALCIUM ACETATE (PHOS BINDER) 667 MG/5ML PO SOLN
1334.0000 mg | Freq: Three times a day (TID) | ORAL | Status: DC
  Administered 2015-05-12 – 2015-05-14 (×6): 1334 mg via ORAL
  Filled 2015-05-12 (×14): qty 10

## 2015-05-12 MED ORDER — SODIUM CHLORIDE 0.9% FLUSH: 3.0000 mL | INTRAVENOUS | Status: DC | PRN

## 2015-05-12 MED ORDER — DOCUSATE SODIUM 100 MG PO CAPS: 100.0000 mg | ORAL_CAPSULE | Freq: Every evening | ORAL | Status: DC | PRN

## 2015-05-12 MED ORDER — DEXTROSE 50 % IV SOLN
25.0000 g | Freq: Once | INTRAVENOUS | Status: AC
  Administered 2015-05-12: 25 g via INTRAVENOUS
  Filled 2015-05-12: qty 50

## 2015-05-12 MED ORDER — NITROGLYCERIN 0.4 MG SL SUBL: 0.4000 mg | SUBLINGUAL_TABLET | SUBLINGUAL | Status: DC | PRN

## 2015-05-12 MED ORDER — EZETIMIBE 10 MG PO TABS
10.0000 mg | ORAL_TABLET | Freq: Every day | ORAL | Status: DC
  Administered 2015-05-12 – 2015-05-16 (×5): 10 mg via ORAL
  Filled 2015-05-12 (×5): qty 1

## 2015-05-12 MED ORDER — EZETIMIBE-SIMVASTATIN 10-40 MG PO TABS: 1.0000 | ORAL_TABLET | Freq: Every day | ORAL | Status: DC

## 2015-05-12 MED ORDER — INSULIN GLARGINE 100 UNIT/ML ~~LOC~~ SOLN
65.0000 [IU] | Freq: Every day | SUBCUTANEOUS | Status: DC
  Filled 2015-05-12 (×2): qty 0.65

## 2015-05-12 MED ORDER — CARVEDILOL 6.25 MG PO TABS
6.2500 mg | ORAL_TABLET | Freq: Two times a day (BID) | ORAL | Status: DC
  Administered 2015-05-12 – 2015-05-16 (×8): 6.25 mg via ORAL
  Filled 2015-05-12 (×8): qty 1

## 2015-05-12 MED ORDER — AMLODIPINE BESYLATE 5 MG PO TABS
10.0000 mg | ORAL_TABLET | Freq: Every day | ORAL | Status: DC
  Administered 2015-05-12 – 2015-05-16 (×5): 10 mg via ORAL
  Filled 2015-05-12 (×5): qty 2

## 2015-05-12 MED ORDER — ACETAMINOPHEN 650 MG RE SUPP: 650.0000 mg | Freq: Four times a day (QID) | RECTAL | Status: DC | PRN

## 2015-05-12 MED ORDER — INSULIN ASPART 100 UNIT/ML ~~LOC~~ SOLN
10.0000 [IU] | Freq: Once | SUBCUTANEOUS | Status: AC
  Administered 2015-05-12: 10 [IU] via INTRAVENOUS
  Filled 2015-05-12: qty 0.1
  Filled 2015-05-12: qty 10

## 2015-05-12 MED ORDER — BUPROPION HCL ER (SR) 150 MG PO TB12
150.0000 mg | ORAL_TABLET | Freq: Every day | ORAL | Status: DC
  Administered 2015-05-12 – 2015-05-16 (×5): 150 mg via ORAL
  Filled 2015-05-12 (×5): qty 1

## 2015-05-12 MED ORDER — ASPIRIN EC 81 MG PO TBEC
81.0000 mg | DELAYED_RELEASE_TABLET | Freq: Every day | ORAL | Status: DC
  Administered 2015-05-12 – 2015-05-16 (×5): 81 mg via ORAL
  Filled 2015-05-12 (×5): qty 1

## 2015-05-12 MED ORDER — SODIUM CHLORIDE 0.9% FLUSH
3.0000 mL | Freq: Two times a day (BID) | INTRAVENOUS | Status: DC
  Administered 2015-05-12: 3 mL via INTRAVENOUS

## 2015-05-12 MED ORDER — GABAPENTIN 300 MG PO CAPS
300.0000 mg | ORAL_CAPSULE | Freq: Three times a day (TID) | ORAL | Status: DC
  Administered 2015-05-12 – 2015-05-16 (×12): 300 mg via ORAL
  Filled 2015-05-12 (×12): qty 1

## 2015-05-12 MED ORDER — ONDANSETRON 4 MG PO TBDP: 4.0000 mg | ORAL_TABLET | Freq: Three times a day (TID) | ORAL | Status: DC | PRN

## 2015-05-12 MED ORDER — OXYCODONE-ACETAMINOPHEN 5-325 MG PO TABS
1.0000 | ORAL_TABLET | Freq: Four times a day (QID) | ORAL | Status: DC | PRN
  Administered 2015-05-12 – 2015-05-14 (×5): 1 via ORAL
  Filled 2015-05-12 (×5): qty 1

## 2015-05-12 NOTE — ED Notes (Signed)
Patient states she has been having intermittent chest pain that began about 3 days ago.  Patient states she has been taking nitroglycerin which has occasionally improved her pain.  Patient does home peritoneal dialysis.  Patient is alert and oriented x 4.  Complaining of being weak.

## 2015-05-12 NOTE — Progress Notes (Signed)
Skin checked byTanya B RN  

## 2015-05-12 NOTE — ED Notes (Signed)
Report called to 2a  Doll RN  Pt to transfer at this time

## 2015-05-12 NOTE — H&P (Signed)
[facility] - [place] at [facility]   PATIENT NAME: Kimberly Montgomery    MR#:  [id number]  DATE OF BIRTH:  1965/09/17  DATE OF ADMISSION:  05/12/2015  PRIMARY CARE PHYSICIAN:  [facility]   REQUESTING/REFERRING PHYSICIAN: Daryel November  CHIEF COMPLAINT:   Chief Complaint  Patient presents with  . Weakness  . Shortness of Breath  . Chest Pain    HISTORY OF PRESENT ILLNESS: Kimberly Montgomery  is a 50 y.o. female with a known history of  ESRD on PD, htn, congestive heart failure, diabetes type 2, gastroparesis, asthma and history of coronary artery disease with 3 stents in the past who presents to the ED with complaint of having shortness of breath ongoing for 1 month but progressively worse over the past 3 days. She describes chest congestion and dyspnea on exertion. She also reports that she's gained the weight over the past few weeks. She feels that her atenolol dialysis is not removing enough fluid as it should. Patient also has been having chest pain since yesterday. Has to take 3 nitroglycerin. In the ER she is noted to have a troponin that was elevated EKG does not revealing. She she is also complaining of feeling nauseous.    PAST MEDICAL HISTORY:   Past Medical History  Diagnosis Date  . Renal disorder   . Hypertension   . CHF (congestive heart failure) (HCC)   . Diabetes mellitus without complication (HCC)   . Gastroparesis   . Asthma   . ESRD (end stage renal disease) (HCC)   . Endometriosis     PAST SURGICAL HISTORY:  Past Surgical History  Procedure Laterality Date  . Hysterotomy    . Below knee leg amputation    . Pd tube inserstion    . Hd fistula surgery with reversal      SOCIAL HISTORY:  Social History  Substance Use Topics  . Smoking status: Current Every Day Smoker  . Smokeless tobacco: Not on file  . Alcohol Use: No    FAMILY HISTORY:  Family History  Problem Relation Age of Onset  . CAD    . Diabetes    . Bipolar disorder       DRUG ALLERGIES:  Allergies  Allergen Reactions  . Cephalosporins Anaphylaxis  . Penicillins Anaphylaxis  . Phenergan [Promethazine Hcl] Nausea And Vomiting    REVIEW OF SYSTEMS:   CONSTITUTIONAL: No fever, positive fatigue and weakness.  EYES: No blurred or double vision.  EARS, NOSE, AND THROAT: No tinnitus or ear pain.  RESPIRATORY: No cough, positive shortness of breath, no wheezing or hemoptysis.  CARDIOVASCULAR: No chest pain, orthopnea, edema.  GASTROINTESTINAL: No nausea, vomiting, diarrhea or abdominal pain.  GENITOURINARY: No dysuria, hematuria.  ENDOCRINE: No polyuria, nocturia,  HEMATOLOGY: No anemia, easy bruising or bleeding SKIN: No rash or lesion. MUSCULOSKELETAL: No joint pain or arthritis.   NEUROLOGIC: No tingling, numbness, weakness.  PSYCHIATRY: No anxiety or depression.   MEDICATIONS AT HOME:  Prior to Admission medications   Medication Sig Start Date End Date Taking? Authorizing Provider  buPROPion (WELLBUTRIN SR) 150 MG 12 hr tablet Take 150 mg by mouth daily.   Yes Historical Provider, MD  carvedilol (COREG) 6.25 MG tablet Take 6.25 mg by mouth 2 (two) times daily with a meal.   Yes Historical Provider, MD  erythromycin (ERY-TAB) 250 MG EC tablet Take 250 mg by mouth 4 (four) times daily.   Yes Historical Provider, MD  gabapentin (NEURONTIN) 300 MG capsule Take  300 mg by mouth 3 (three) times daily.   Yes Historical Provider, MD  insulin aspart (NOVOLOG) 100 UNIT/ML injection Inject 15 Units into the skin 3 (three) times daily with meals.   Yes Historical Provider, MD  insulin glargine (LANTUS) 100 UNIT/ML injection Inject 65 Units into the skin at bedtime.   Yes Historical Provider, MD  lisinopril (PRINIVIL,ZESTRIL) 20 MG tablet Take 20 mg by mouth every evening.   Yes Historical Provider, MD  omeprazole (PRILOSEC) 20 MG capsule Take 40 mg by mouth 2 (two) times daily.   Yes Historical Provider, MD  ondansetron (ZOFRAN-ODT) 4 MG disintegrating tablet  Take 4 mg by mouth every 8 (eight) hours as needed for nausea or vomiting.   Yes Historical Provider, MD  torsemide (DEMADEX) 100 MG tablet Take 100 mg by mouth daily.   Yes Historical Provider, MD      PHYSICAL EXAMINATION:   VITAL SIGNS: Blood pressure 170/67, pulse 78, temperature 98.4 F (36.9 C), temperature source Oral, resp. rate 18, height 5\' 9"  (1.753 m), weight 116.2 kg (256 lb 2.8 oz), last menstrual period 03/16/2015, SpO2 99 %.  GENERAL:  50 y.o.-year-old patient lying in the bed with no acute distress.  EYES: Pupils equal, round, reactive to light and accommodation. No scleral icterus. Extraocular muscles intact.  HEENT: Head atraumatic, normocephalic. Oropharynx and nasopharynx clear.  NECK:  Supple, no jugular venous distention. No thyroid enlargement, no tenderness.  LUNGS: Bilateral crackles at the bases no sinus surgery muscle usage  CARDIOVASCULAR: S1, S2 normal. No murmurs, rubs, or gallops.  ABDOMEN: Soft, nontender, nondistended. Bowel sounds present. No organomegaly or mass.  EXTREMITIES: Positive pedal edema, cyanosis, or clubbing.  NEUROLOGIC: Cranial nerves II through XII are intact. Muscle strength 5/5 in all extremities. Sensation intact. Gait not checked.  PSYCHIATRIC: The patient is alert and oriented x 3.  SKIN: No obvious rash, lesion, or ulcer.   LABORATORY PANEL:   CBC  Recent Labs Lab 05/12/15 1300  WBC 8.3  HGB 9.2*  HCT 27.8*  PLT 326  MCV 84.3  MCH 27.9  MCHC 33.1  RDW 14.5   ------------------------------------------------------------------------------------------------------------------  Chemistries   Recent Labs Lab 05/12/15 1300  NA 138  K 5.9*  CL 112*  CO2 20*  GLUCOSE 204*  BUN 67*  CREATININE 6.65*  CALCIUM 7.9*   ------------------------------------------------------------------------------------------------------------------ estimated creatinine clearance is 13.6 mL/min (by C-G formula based on Cr of  6.65). ------------------------------------------------------------------------------------------------------------------ No results for input(s): TSH, T4TOTAL, T3FREE, THYROIDAB in the last 72 hours.  Invalid input(s): FREET3   Coagulation profile No results for input(s): INR, PROTIME in the last 168 hours. ------------------------------------------------------------------------------------------------------------------- No results for input(s): DDIMER in the last 72 hours. -------------------------------------------------------------------------------------------------------------------  Cardiac Enzymes  Recent Labs Lab 05/12/15 1300  TROPONINI <0.03   ------------------------------------------------------------------------------------------------------------------ Invalid input(s): POCBNP  ---------------------------------------------------------------------------------------------------------------  Urinalysis    Component Value Date/Time   COLORURINE Straw 12/31/2013 1818   APPEARANCEUR Clear 12/31/2013 1818   LABSPEC 1.009 12/31/2013 1818   PHURINE 6.0 12/31/2013 1818   GLUCOSEU 150 mg/dL 16/12/9602 5409   HGBUR Negative 12/31/2013 1818   BILIRUBINUR Negative 12/31/2013 1818   KETONESUR Negative 12/31/2013 1818   PROTEINUR 100 mg/dL 81/19/1478 2956   NITRITE Negative 12/31/2013 1818   LEUKOCYTESUR Negative 12/31/2013 1818     RADIOLOGY: Dg Chest 2 View  05/12/2015  CLINICAL DATA:  End-stage renal disease on peritoneal dialysis. Shortness of breath for 3 days. EXAM: CHEST  2 VIEW COMPARISON:  12/31/2013 FINDINGS: Cardiomediastinal silhouette is normal. Mediastinal contours appear  intact. There is no evidence of focal airspace consolidation, pleural effusion or pneumothorax. There is mild pulmonary vascular congestion. Osseous structures are without acute abnormality. Soft tissues are grossly normal. IMPRESSION: Mild pulmonary vascular congestion. Electronically Signed    By: Ted Mcalpine M.D.   On: 05/12/2015 13:31    EKG: Orders placed or performed during the hospital encounter of 05/12/15  . ED EKG within 10 minutes  . ED EKG within 10 minutes    IMPRESSION AND PLAN: Patient is a 50 year old white female on her toenail dialysis presents with shortness of breath chest pressure  1. Acute CHF type unknown: Nephrology will evaluate the patient. And adjust  Her peritoneal dialysis. If that does not work may need hemodialysis, continue Demadex I'll place her on IV Lasix  2. Hyperkalemia status post treatment with Kayexalate follow-up BMP  3. Diabetes type 2: Place on sliding scale insulin, continue lantus + premeal insulin  4. Hypertension continue Norvasc and Coreg  5. Hyperlipidemia continue cholesterol-lowering lowering regimen  6. GERD continue omeprazole  7. Chest pain check serial enzymes and cardiology consult continue Coreg and Plavix    All the records are reviewed and case discussed with ED provider. Management plans discussed with the patient, family and they are in agreement.  CODE STATUS: Full    Code Status Orders        Start     Ordered   05/12/15 1546  Full code   Continuous     05/12/15 1546    Code Status History    Date Active Date Inactive Code Status Order ID Comments User Context   This patient has a current code status but no historical code status.       TOTAL TIME TAKING CARE OF THIS PATIENT: 55 minutes.    Auburn Bilberry M.D on 05/12/2015 at 3:57 PM  Between 7am to 6pm - Pager - [phone]  After 6pm go to [email or web address] - password [email or web address] [facility]  [facility]  Office  [phone]  CC: Primary care physician; No primary care provider on file.

## 2015-05-12 NOTE — ED Provider Notes (Signed)
[facility] Emergency Department Provider Note     Time seen: ----------------------------------------- 3:07 PM on 05/12/2015 -----------------------------------------    I have reviewed the triage vital signs and the nursing notes.   HISTORY  Chief Complaint Weakness; Shortness of Breath; and Chest Pain    HPI Kimberly Montgomery is a 50 y.o. female who presents ER for weakness congestion and cold symptoms with shortness of breath for the last month. Patient does describe some chest pain that radiates to her left arm. She reports no episodes of chest pain last 3 days. She is taking 3 nitroglycerin with each episode. She states she's peroneal dialysis patient, she is gained 7 kg the last month. Patient is not sure what her normal GFR or creatinine is.   Past Medical History  Diagnosis Date  . Renal disorder   . Hypertension   . CHF (congestive heart failure) (HCC)   . Diabetes mellitus without complication (HCC)     There are no active problems to display for this patient.   No past surgical history on file.  Allergies Cephalosporins; Penicillins; and Phenergan  Social History Social History  Substance Use Topics  . Smoking status: Current Every Day Smoker  . Smokeless tobacco: Not on file  . Alcohol Use: No    Review of Systems Constitutional: Negative for fever. Eyes: Negative for visual changes. ENT: Negative for sore throat. Cardiovascular: Positive for chest pain Respiratory: Positive shortness of breath and cough Gastrointestinal: Negative for abdominal pain, vomiting and diarrhea. Genitourinary: Negative for dysuria. Musculoskeletal: Negative for back pain. Skin: Negative for rash. Neurological: Negative for headaches, positive for weakness  10-point ROS otherwise negative.  ____________________________________________   PHYSICAL EXAM:  VITAL SIGNS: ED Triage Vitals  Enc Vitals Group     BP 05/12/15 1235 190/62 mmHg   Pulse Rate 05/12/15 1235 81     Resp 05/12/15 1459 18     Temp 05/12/15 1235 98.5 F (36.9 C)     Temp Source 05/12/15 1235 Oral     SpO2 05/12/15 1235 99 %     Weight 05/12/15 1235 256 lb 2.8 oz (116.2 kg)     Height 05/12/15 1235  (1.753 m)     Head Cir --      Peak Flow --      Pain Score 05/12/15 1237 10     Pain Loc --      Pain Edu? --      Excl. in GC? --     Constitutional: Alert and oriented. Mild distress Eyes: Conjunctivae are normal. PERRL. Normal extraocular movements. ENT   Head: Normocephalic and atraumatic.   Nose: No congestion/rhinnorhea.   Mouth/Throat: Mucous membranes are moist.   Neck: No stridor. Cardiovascular: Normal rate, regular rhythm. Normal and symmetric distal pulses are present in all extremities. No murmurs, rubs, or gallops. Respiratory: Normal respiratory effort without tachypnea nor retractions. Mild wheezing bilaterally. Gastrointestinal: Soft and nontender. No distention. No abdominal bruits. Peritoneal dialysis catheter site is unremarkable Musculoskeletal: Nontender with normal range of motion in all extremities. No joint effusions.  Mild pitting edema is noted in the left leg Neurologic:  Normal speech and language. No gross focal neurologic deficits are appreciated.  Skin:  Skin is warm, dry and intact. No rash noted. Psychiatric: Mood and affect are normal. Speech and behavior are normal. Patient exhibits appropriate insight and judgment. ____________________________________________  EKG: Interpreted by me. Normal sinus rhythm with a rate of 71 bpm, normal PR interval, possible anterior  infarct age indeterminate., normal QT intervals. Normal axis. Possible hyperacute T waves  ____________________________________________  ED COURSE:  Pertinent labs & imaging results that were available during my care of the patient were reviewed by me and considered in my medical decision making (see chart for details). She is a  dialysis patient with weight gain and increased shortness of breath. She may require admission and hemodialysis. I will check normal renal labs and reevaluate. ____________________________________________    LABS (pertinent positives/negatives)  Labs Reviewed  BASIC METABOLIC PANEL - Abnormal; Notable for the following:    Potassium 5.9 (*)    Chloride 112 (*)    CO2 20 (*)    Glucose, Bld 204 (*)    BUN 67 (*)    Creatinine, Ser 6.65 (*)    Calcium 7.9 (*)    GFR calc non Af Amer 6 (*)    GFR calc Af Amer 8 (*)    All other components within normal limits  CBC - Abnormal; Notable for the following:    RBC 3.30 (*)    Hemoglobin 9.2 (*)    HCT 27.8 (*)    All other components within normal limits  TROPONIN I   CRITICAL CARE Performed by: Emily Filbert   Total critical care time: 30 minutes  Critical care time was exclusive of separately billable procedures and treating other patients.  Critical care was necessary to treat or prevent imminent or life-threatening deterioration.  Critical care was time spent personally by me on the following activities: development of treatment plan with patient and/or surrogate as well as nursing, discussions with consultants, evaluation of patient's response to treatment, examination of patient, obtaining history from patient or surrogate, ordering and performing treatments and interventions, ordering and review of laboratory studies, ordering and review of radiographic studies, pulse oximetry and re-evaluation of patient's condition.   RADIOLOGY  Chest x-ray  IMPRESSION: Mild pulmonary vascular congestion. ____________________________________________  FINAL ASSESSMENT AND PLAN  Acute on chronic renal failure, pulmonary vascular congestion, hyperkalemia, weakness  Plan: Patient with labs and imaging as dictated above. I have discussed with the nephrologist on-call who recommends admission. She's been given emergent treatment  for hyperkalemia including insulin, D50 and Kayexalate. I will discuss with the hospitalist for admission. Currently vital signs are stable.   Emily Filbert, MD   Emily Filbert, MD 05/12/15 [phone]

## 2015-05-12 NOTE — ED Notes (Signed)
Pt arrives with c/o weakness, congestion and cold symptoms, shortness of breath  Pt also describes midsternal chest pain that radiates down her left arm  Pt reports 9 episodes of CP in the last three days She has taken nitro x3 with each episode

## 2015-05-13 ENCOUNTER — Encounter: Payer: Self-pay | Admitting: *Deleted

## 2015-05-13 LAB — CBC
HCT: 26.6 % — ABNORMAL LOW (ref 35.0–47.0)
Hemoglobin: 8.6 g/dL — ABNORMAL LOW (ref 12.0–16.0)
MCH: 27.9 pg (ref 26.0–34.0)
MCHC: 32.5 g/dL (ref 32.0–36.0)
MCV: 85.7 fL (ref 80.0–100.0)
Platelets: 281 10*3/uL (ref 150–440)
RBC: 3.1 MIL/uL — ABNORMAL LOW (ref 3.80–5.20)
RDW: 14.9 % — ABNORMAL HIGH (ref 11.5–14.5)
WBC: 6.4 10*3/uL (ref 3.6–11.0)

## 2015-05-13 LAB — BASIC METABOLIC PANEL
Anion gap: 7 (ref 5–15)
BUN: 60 mg/dL — AB (ref 6–20)
CHLORIDE: 110 mmol/L (ref 101–111)
CO2: 20 mmol/L — ABNORMAL LOW (ref 22–32)
CREATININE: 6.64 mg/dL — AB (ref 0.44–1.00)
Calcium: 7.6 mg/dL — ABNORMAL LOW (ref 8.9–10.3)
GFR calc Af Amer: 8 mL/min — ABNORMAL LOW (ref >60)
GFR calc non Af Amer: 7 mL/min — ABNORMAL LOW (ref >60)
Glucose, Bld: 289 mg/dL — ABNORMAL HIGH (ref 65–99)
Potassium: 5 mmol/L (ref 3.5–5.1)
SODIUM: 137 mmol/L (ref 135–145)

## 2015-05-13 LAB — GLUCOSE, CAPILLARY
GLUCOSE-CAPILLARY: 144 mg/dL — AB (ref 65–99)
GLUCOSE-CAPILLARY: 179 mg/dL — AB (ref 65–99)
Glucose-Capillary: 192 mg/dL — ABNORMAL HIGH (ref 65–99)
Glucose-Capillary: 60 mg/dL — ABNORMAL LOW (ref 65–99)
Glucose-Capillary: 61 mg/dL — ABNORMAL LOW (ref 65–99)
Glucose-Capillary: 244 mg/dL — ABNORMAL HIGH (ref 65–99)
Glucose-Capillary: 200 mg/dL — ABNORMAL HIGH (ref 65–99)

## 2015-05-13 LAB — TROPONIN I

## 2015-05-13 MED ORDER — FUROSEMIDE 10 MG/ML IJ SOLN
8.0000 mg/h | INTRAVENOUS | Status: DC
  Administered 2015-05-13 – 2015-05-14 (×2): 8 mg/h via INTRAVENOUS
  Filled 2015-05-13 (×4): qty 25

## 2015-05-13 MED ORDER — DELFLEX-LC/4.25% DEXTROSE 483 MOSM/L IP SOLN
INTRAPERITONEAL | Status: DC
  Administered 2015-05-13 – 2015-05-14 (×2): 10000 mL via INTRAPERITONEAL
  Filled 2015-05-13: qty 3000

## 2015-05-13 MED ORDER — NICOTINE 21 MG/24HR TD PT24
21.0000 mg | MEDICATED_PATCH | Freq: Every day | TRANSDERMAL | Status: DC
Start: 2015-05-13 — End: 2015-05-16
  Administered 2015-05-13 – 2015-05-16 (×4): 21 mg via TRANSDERMAL
  Filled 2015-05-13 (×4): qty 1

## 2015-05-13 MED ORDER — CYCLOBENZAPRINE HCL 10 MG PO TABS
5.0000 mg | ORAL_TABLET | Freq: Three times a day (TID) | ORAL | Status: DC | PRN
  Administered 2015-05-13 – 2015-05-14 (×3): 5 mg via ORAL
  Filled 2015-05-13 (×3): qty 1

## 2015-05-13 MED ORDER — GENTAMICIN SULFATE 0.1 % EX CREA
1.0000 "application " | TOPICAL_CREAM | Freq: Every day | CUTANEOUS | Status: DC
  Administered 2015-05-14: 1 via TOPICAL
  Filled 2015-05-13: qty 15

## 2015-05-13 MED ORDER — DEXTROSE 50 % IV SOLN
1.0000 | Freq: Once | INTRAVENOUS | Status: AC
  Administered 2015-05-13: 50 mL via INTRAVENOUS

## 2015-05-13 MED ORDER — DEXTROSE 50 % IV SOLN
INTRAVENOUS | Status: AC
  Administered 2015-05-13: 50 mL
  Filled 2015-05-13: qty 50

## 2015-05-13 MED ORDER — INSULIN GLARGINE 100 UNIT/ML ~~LOC~~ SOLN
40.0000 [IU] | Freq: Every day | SUBCUTANEOUS | Status: DC
  Administered 2015-05-13 – 2015-05-15 (×3): 40 [IU] via SUBCUTANEOUS
  Filled 2015-05-13 (×4): qty 0.4

## 2015-05-13 MED ORDER — ATORVASTATIN CALCIUM 20 MG PO TABS
20.0000 mg | ORAL_TABLET | Freq: Every day | ORAL | Status: DC
  Administered 2015-05-13 – 2015-05-15 (×3): 20 mg via ORAL
  Filled 2015-05-13 (×3): qty 1

## 2015-05-13 NOTE — Progress Notes (Signed)
Was not notified that patient's CBG resulted at 60. Gave patient orange juice as soon as resulted was seen. Patient does not feel sweaty or light headed, she is just having cramps. Dr. Allena Katz has been paged with no call back yet for a muscle relaxer. Will recheck CBG in 15-20 minutes.

## 2015-05-13 NOTE — Progress Notes (Signed)
[facility] [place] at [facility]                                                                                                                                                                                            Patient Demographics   Kimberly Montgomery, is a 50 y.o. female, DOB - 03-09-1966, ZOX:[id number]  Admit date - 05/12/2015   Admitting Physician Auburn Bilberry, MD  Outpatient Primary MD for the patient is No primary care provider on file.   LOS - 1  Subjective: Patient feeling little better. Complains of some wheezing. Shortness of breath improved     Review of Systems:   CONSTITUTIONAL: No documented fever. No fatigue, weakness. No weight gain, no weight loss.  EYES: No blurry or double vision.  ENT: No tinnitus. No postnasal drip. No redness of the oropharynx.  RESPIRATORY: No cough, positive wheeze, no hemoptysis. Positive dyspnea.  CARDIOVASCULAR: No chest pain. No orthopnea. No palpitations. No syncope.  GASTROINTESTINAL: No nausea, no vomiting or diarrhea. No abdominal pain. No melena or hematochezia.  GENITOURINARY: No dysuria or hematuria.  ENDOCRINE: No polyuria or nocturia. No heat or cold intolerance.  HEMATOLOGY: No anemia. No bruising. No bleeding.  INTEGUMENTARY: No rashes. No lesions.  MUSCULOSKELETAL: No arthritis. No swelling. No gout.  NEUROLOGIC: No numbness, tingling, or ataxia. No seizure-type activity.  PSYCHIATRIC: No anxiety. No insomnia. No ADD.    Vitals:   Filed Vitals:   05/12/15 1810 05/12/15 2024 05/13/15 0449 05/13/15 0750  BP: 171/63 158/61 142/56   Pulse: 72 74 72   Temp: 98.2 F (36.8 C) 98.2 F (36.8 C) 97.7 F (36.5 C)   TempSrc: Oral Oral Oral   Resp: 17 16 16    Height:      Weight:   119.341 kg (263 lb 1.6 oz)   SpO2: 95% 100% 94% 97%    Wt Readings from Last 3 Encounters:  05/13/15 119.341 kg (263 lb 1.6 oz)     Intake/Output Summary (Last 24 hours) at 05/13/15 0911 Last data filed at  05/13/15 0847  Gross per 24 hour  Intake      3 ml  Output    950 ml  Net   -947 ml    Physical Exam:   GENERAL: Pleasant-appearing in no apparent distress.  HEAD, EYES, EARS, NOSE AND THROAT: Atraumatic, normocephalic. Extraocular muscles are intact. Pupils equal and reactive to light. Sclerae anicteric. No conjunctival injection. No oro-pharyngeal erythema.  NECK: Supple. There is no jugular venous distention. No bruits, no lymphadenopathy, no thyromegaly.  HEART: Regular rate and rhythm,. No murmurs, no rubs, no clicks.  LUNGS: Bilateral occasional wheezing  ABDOMEN: Soft, flat, nontender, nondistended. Has good bowel sounds. No hepatosplenomegaly appreciated.  EXTREMITIES: No evidence of any cyanosis, clubbing, or peripheral edema.  +2 pedal and radial pulses bilaterally.  NEUROLOGIC: The patient is alert, awake, and oriented x3 with no focal motor or sensory deficits appreciated bilaterally.  SKIN: Moist and warm with no rashes appreciated.  Psych: Not anxious, depressed LN: No inguinal LN enlargement    Antibiotics   Anti-infectives    Start     Dose/Rate Route Frequency Ordered Stop   05/12/15 1800  erythromycin (ERY-TAB) EC tablet 250 mg     250 mg Oral 4 times daily 05/12/15 1603        Medications   Scheduled Meds: . amLODipine  10 mg Oral Daily  . aspirin EC  81 mg Oral Daily  . atorvastatin  20 mg Oral q1800  . buPROPion  150 mg Oral Daily  . calcium acetate (Phos Binder)  1,334 mg Oral TID WC  . carvedilol  6.25 mg Oral BID WC  . clopidogrel  75 mg Oral Daily  . erythromycin  250 mg Oral QID  . ezetimibe  10 mg Oral Daily  . furosemide  40 mg Intravenous Q12H  . gabapentin  300 mg Oral TID  . heparin  5,000 Units Subcutaneous 3 times per day  . insulin aspart  0-9 Units Subcutaneous TID WC  . insulin aspart  15 Units Subcutaneous TID WC  . insulin glargine  65 Units Subcutaneous QHS  . lisinopril  20 mg Oral QPM  . nicotine  21 mg Transdermal Daily  .  pantoprazole  40 mg Oral Daily  . sodium chloride flush  3 mL Intravenous Q12H  . torsemide  100 mg Oral Daily   Continuous Infusions:  PRN Meds:.sodium chloride, acetaminophen **OR** acetaminophen, acetaminophen, albuterol, aspirin-acetaminophen-caffeine, docusate sodium, nitroGLYCERIN, ondansetron **OR** ondansetron (ZOFRAN) IV, oxyCODONE-acetaminophen, polyethylene glycol, sodium chloride flush   Data Review:   Micro Results No results found for this or any previous visit (from the past 240 hour(s)).  Radiology Reports Dg Chest 2 View  05/12/2015  CLINICAL DATA:  End-stage renal disease on peritoneal dialysis. Shortness of breath for 3 days. EXAM: CHEST  2 VIEW COMPARISON:  12/31/2013 FINDINGS: Cardiomediastinal silhouette is normal. Mediastinal contours appear intact. There is no evidence of focal airspace consolidation, pleural effusion or pneumothorax. There is mild pulmonary vascular congestion. Osseous structures are without acute abnormality. Soft tissues are grossly normal. IMPRESSION: Mild pulmonary vascular congestion. Electronically Signed   By: Ted Mcalpine M.D.   On: 05/12/2015 13:31     CBC  Recent Labs Lab 05/12/15 1300 05/13/15 0355  WBC 8.3 6.4  HGB 9.2* 8.6*  HCT 27.8* 26.6*  PLT 326 281  MCV 84.3 85.7  MCH 27.9 27.9  MCHC 33.1 32.5  RDW 14.5 14.9*    Chemistries   Recent Labs Lab 05/12/15 1300 05/13/15 0355  NA 138 137  K 5.9* 5.0  CL 112* 110  CO2 20* 20*  GLUCOSE 204* 289*  BUN 67* 60*  CREATININE 6.65* 6.64*  CALCIUM 7.9* 7.6*   ------------------------------------------------------------------------------------------------------------------ estimated creatinine clearance is 13.8 mL/min (by C-G formula based on Cr of 6.64). ------------------------------------------------------------------------------------------------------------------ No results for input(s): HGBA1C in the last 72  hours. ------------------------------------------------------------------------------------------------------------------ No results for input(s): CHOL, HDL, LDLCALC, TRIG, CHOLHDL, LDLDIRECT in the last 72 hours. ------------------------------------------------------------------------------------------------------------------  Recent Labs  05/12/15 1820  TSH 6.833*   ------------------------------------------------------------------------------------------------------------------ No results for input(s): VITAMINB12, FOLATE, FERRITIN, TIBC,  IRON, RETICCTPCT in the last 72 hours.  Coagulation profile No results for input(s): INR, PROTIME in the last 168 hours.  No results for input(s): DDIMER in the last 72 hours.  Cardiac Enzymes  Recent Labs Lab 05/12/15 1820 05/12/15 2131 05/13/15 0355  TROPONINI <0.03 0.04* <0.03   ------------------------------------------------------------------------------------------------------------------ Invalid input(s): POCBNP    Assessment & Plan   IMPRESSION AND PLAN: Patient is a 50 year old white female on her toenail dialysis presents with shortness of breath chest pressure  1. Acute CHF type unknown/fluid overload Continue paratonia on dialysis, continue IV Lasix and Demadex.   2. Hyperkalemia status post treatment with Kayexalate resolved  3. Diabetes type 2: Continue sliding scale insulin and home regimen  4. Hypertension continue Norvasc and Coreg blood pressure stable  5. Hyperlipidemia continue cholesterol-lowering lowering regimen  6. GERD continue omeprazole  7. Chest pain negative cardiac enzymes recent stress test in December which was negative symptoms likely related to fluid overload     Code Status Orders        Start     Ordered   05/12/15 1546  Full code   Continuous     05/12/15 1546    Code Status History    Date Active Date Inactive Code Status Order ID Comments User Context   This patient has a  current code status but no historical code status.           Consults  nephrology DVT Prophylaxis  heparin  Lab Results  Component Value Date   PLT 281 05/13/2015     Time Spent in minutes   Auburn Bilberry M.D on 05/13/2015 at 9:11 AM  Between 7am to 6pm - Pager - [phone]  After 6pm go to [email or web address] - password [email or web address] [facility]  [facility]   Office  [phone]

## 2015-05-13 NOTE — Progress Notes (Signed)
Patient had drank 2 apple juices on her own before blood sugar was taken. Given 2 orange juices as well and CBG only came up to 61. Dr. Allena Katz notified and stated to give amp of D50. Given now and will recheck in 15 minutes. Have not received lasix drip from pharmacy yet, called and notified them. Will start when it is received.

## 2015-05-13 NOTE — Progress Notes (Signed)
CBG is up to 144 and patient is feeling better, more alert. Will continue to assess.

## 2015-05-13 NOTE — Progress Notes (Signed)
Kimberly Montgomery is a 50 y.o. female  [id number]  Primary Cardiologist: Adrian Blackwater Reason for Consultation: Chest pain  HPI: This is a 50 year old white female with a past medical history of end-stage renal disease congestive heart failure diabetes type 2 gastroparesis asthma coronary artery disease status post PCI and stenting 6 years ago. Patient had a stress test at [facility] a month ago which was apparently negative but presents with recurrent chest pain and left sided associated with diaphoresis and radiating to the left arm.   Review of Systems: No orthopnea PND or leg swelling.   Past Medical History  Diagnosis Date  . Renal disorder   . Hypertension   . CHF (congestive heart failure) (HCC)   . Diabetes mellitus without complication (HCC)   . Gastroparesis   . Asthma   . ESRD (end stage renal disease) (HCC)   . Endometriosis     Medications Prior to Admission  Medication Sig Dispense Refill  . acetaminophen (TYLENOL) 500 MG tablet Take 1,000 mg by mouth every 6 (six) hours as needed for mild pain.     [name] albuterol (PROVENTIL HFA;VENTOLIN HFA) 108 (90 Base) MCG/ACT inhaler Inhale 2 puffs into the lungs every 6 (six) hours as needed for wheezing or shortness of breath.    [name] amLODipine (NORVASC) 10 MG tablet Take 10 mg by mouth daily.    [name] aspirin EC 81 MG tablet Take 81 mg by mouth daily.    [name] aspirin-acetaminophen-caffeine (EXCEDRIN MIGRAINE) 250-250-65 MG tablet Take 2 tablets by mouth every 6 (six) hours as needed for headache.    [name] buPROPion (WELLBUTRIN SR) 150 MG 12 hr tablet Take 150 mg by mouth daily.    . calcium acetate, Phos Binder, (PHOSLYRA) 667 MG/5ML SOLN Take 1,334 mg by mouth 3 (three) times daily with meals.    . carvedilol (COREG) 6.25 MG tablet Take 6.25 mg by mouth 2 (two) times daily with a meal.    . clopidogrel (PLAVIX) 75 MG tablet Take 75 mg by mouth daily.    [name] docusate sodium (COLACE) 100 MG capsule Take 100 mg by mouth at bedtime as needed  for mild constipation.    [name] erythromycin (ERY-TAB) 250 MG EC tablet Take 250 mg by mouth 4 (four) times daily.    [name] ezetimibe-simvastatin (VYTORIN) 10-40 MG tablet Take 1 tablet by mouth at bedtime.    . gabapentin (NEURONTIN) 300 MG capsule Take 300 mg by mouth 3 (three) times daily.    . insulin aspart (NOVOLOG) 100 UNIT/ML injection Inject 15 Units into the skin 3 (three) times daily with meals.    . insulin glargine (LANTUS) 100 UNIT/ML injection Inject 65 Units into the skin at bedtime.    [name] lisinopril (PRINIVIL,ZESTRIL) 20 MG tablet Take 20 mg by mouth every evening.    . nitroGLYCERIN (NITROSTAT) 0.4 MG SL tablet Place 0.4 mg under the tongue every 5 (five) minutes as needed for chest pain.    [name] omeprazole (PRILOSEC) 20 MG capsule Take 40 mg by mouth 2 (two) times daily.    . ondansetron (ZOFRAN-ODT) 4 MG disintegrating tablet Take 4 mg by mouth every 8 (eight) hours as needed for nausea or vomiting.    . polyethylene glycol (MIRALAX / GLYCOLAX) packet Take 17 g by mouth daily as needed for mild constipation.    . torsemide (DEMADEX) 100 MG tablet Take 100 mg by mouth daily.       [name] amLODipine  10 mg Oral  Daily  . aspirin EC  81 mg Oral Daily  . atorvastatin  20 mg Oral q1800  . buPROPion  150 mg Oral Daily  . calcium acetate (Phos Binder)  1,334 mg Oral TID WC  . carvedilol  6.25 mg Oral BID WC  . clopidogrel  75 mg Oral Daily  . dialysis solution 4.25% low-MG/low-CA   Intraperitoneal Q24H  . erythromycin  250 mg Oral QID  . ezetimibe  10 mg Oral Daily  . gabapentin  300 mg Oral TID  . gentamicin cream  1 application Topical Daily  . heparin  5,000 Units Subcutaneous 3 times per day  . insulin aspart  0-9 Units Subcutaneous TID WC  . insulin glargine  40 Units Subcutaneous QHS  . lisinopril  20 mg Oral QPM  . nicotine  21 mg Transdermal Daily  . pantoprazole  40 mg Oral Daily  . sodium chloride flush  3 mL Intravenous Q12H    Infusions: . furosemide (LASIX) infusion 8  mg/hr (05/13/15 1249)    Allergies  Allergen Reactions  . Cephalosporins Anaphylaxis  . Penicillins Anaphylaxis and Other (See Comments)    Has patient had a PCN reaction causing immediate rash, facial/tongue/throat swelling, SOB or lightheadedness with hypotension: Yes Has patient had a PCN reaction causing severe rash involving mucus membranes or skin necrosis: No Has patient had a PCN reaction that required hospitalization No Has patient had a PCN reaction occurring within the last 10 years: No If all of the above answers are "NO", then may proceed with Cephalosporin use.  [name] Phenergan [Promethazine Hcl] Nausea And Vomiting    Social History   Social History  . Marital Status: Widowed    Spouse Name: N/A  . Number of Children: N/A  . Years of Education: N/A   Occupational History  . Not on file.   Social History Main Topics  . Smoking status: Current Every Day Smoker  . Smokeless tobacco: Not on file  . Alcohol Use: No  . Drug Use: No  . Sexual Activity: Not on file   Other Topics Concern  . Not on file   Social History Narrative    Family History  Problem Relation Age of Onset  . CAD    . Diabetes    . Bipolar disorder      PHYSICAL EXAM: Filed Vitals:   05/13/15 0449 05/13/15 1109  BP: 142/56 177/61  Pulse: 72 81  Temp: 97.7 F (36.5 C) 97.9 F (36.6 C)  Resp: 16 20     Intake/Output Summary (Last 24 hours) at 05/13/15 1336 Last data filed at 05/13/15 1300  Gross per 24 hour  Intake   8243 ml  Output  16109 ml  Net  -1904 ml    General:  Well appearing. No respiratory difficulty HEENT: normal Neck: supple. no JVD. Carotids 2+ bilat; no bruits. No lymphadenopathy or thryomegaly appreciated. Cor: PMI nondisplaced. Regular rate & rhythm. No rubs, gallops or murmurs. Lungs: clear Abdomen: soft, nontender, nondistended. No hepatosplenomegaly. No bruits or masses. Good bowel sounds. Extremities: no cyanosis, clubbing, rash, edema Neuro: alert &  oriented x 3, cranial nerves grossly intact. moves all 4 extremities w/o difficulty. Affect pleasant.  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  Results for orders placed or performed during the hospital encounter of 05/12/15 (from the past 24 hour(s))  TSH     Status: Abnormal   Collection Time: 05/12/15  6:20 PM  Result Value Ref Range  TSH 6.833 (H) 0.350 - 4.500 uIU/mL  Troponin I     Status: None   Collection Time: 05/12/15  6:20 PM  Result Value Ref Range   Troponin I <0.03 <0.031 ng/mL  Glucose, capillary     Status: Abnormal   Collection Time: 05/12/15  8:49 PM  Result Value Ref Range   Glucose-Capillary 139 (H) 65 - 99 mg/dL  Troponin I     Status: Abnormal   Collection Time: 05/12/15  9:31 PM  Result Value Ref Range   Troponin I 0.04 (H) <0.031 ng/mL  Troponin I     Status: None   Collection Time: 05/13/15  3:55 AM  Result Value Ref Range   Troponin I <0.03 <0.031 ng/mL  CBC     Status: Abnormal   Collection Time: 05/13/15  3:55 AM  Result Value Ref Range   WBC 6.4 3.6 - 11.0 K/uL   RBC 3.10 (L) 3.80 - 5.20 MIL/uL   Hemoglobin 8.6 (L) 12.0 - 16.0 g/dL   HCT 47.8 (L) 29.5 - 62.1 %   MCV 85.7 80.0 - 100.0 fL   MCH 27.9 26.0 - 34.0 pg   MCHC 32.5 32.0 - 36.0 g/dL   RDW 30.8 (H) 65.7 - 84.6 %   Platelets 281 150 - 440 K/uL  Basic metabolic panel     Status: Abnormal   Collection Time: 05/13/15  3:55 AM  Result Value Ref Range   Sodium 137 135 - 145 mmol/L   Potassium 5.0 3.5 - 5.1 mmol/L   Chloride 110 101 - 111 mmol/L   CO2 20 (L) 22 - 32 mmol/L   Glucose, Bld 289 (H) 65 - 99 mg/dL   BUN 60 (H) 6 - 20 mg/dL   Creatinine, Ser 9.62 (H) 0.44 - 1.00 mg/dL   Calcium 7.6 (L) 8.9 - 10.3 mg/dL   GFR calc non Af Amer 7 (L) >60 mL/min   GFR calc Af Amer 8 (L) >60 mL/min   Anion gap 7 5 - 15  Glucose, capillary     Status: Abnormal   Collection Time: 05/13/15  7:21 AM  Result Value Ref Range   Glucose-Capillary 192 (H) 65 - 99  mg/dL   Comment 1 Notify RN   Glucose, capillary     Status: Abnormal   Collection Time: 05/13/15 11:08 AM  Result Value Ref Range   Glucose-Capillary 60 (L) 65 - 99 mg/dL   Comment 1 Notify RN   Glucose, capillary     Status: Abnormal   Collection Time: 05/13/15 11:44 AM  Result Value Ref Range   Glucose-Capillary 61 (L) 65 - 99 mg/dL  Glucose, capillary     Status: Abnormal   Collection Time: 05/13/15 12:30 PM  Result Value Ref Range   Glucose-Capillary 144 (H) 65 - 99 mg/dL   Comment 1 Notify RN    Dg Chest 2 View  05/12/2015  CLINICAL DATA:  End-stage renal disease on peritoneal dialysis. Shortness of breath for 3 days. EXAM: CHEST  2 VIEW COMPARISON:  12/31/2013 FINDINGS: Cardiomediastinal silhouette is normal. Mediastinal contours appear intact. There is no evidence of focal airspace consolidation, pleural effusion or pneumothorax. There is mild pulmonary vascular congestion. Osseous structures are without acute abnormality. Soft tissues are grossly normal. IMPRESSION: Mild pulmonary vascular congestion. Electronically Signed   By: Ted Mcalpine M.D.   On: 05/12/2015 13:31     ASSESSMENT AND PLAN: Anginal type of chest pain with prior history of coronary artery disease status post PCI  and stenting. Last PCI and stenting was 8 years ago. Patient had a negative stress test at [facility] recently. At this time is not having chest pain and 2 out of 3 troponins were negative and one troponin was only mildly elevated. There is no acute EKG changes. Part. Patient is having also renal failure. He continues to have chest pain and dialysis can be arranged may consider cardiac catheterization Monday. Aleynah Rocchio A

## 2015-05-13 NOTE — Progress Notes (Signed)
[facility]  ROUNDING NOTE   Subjective:  Patient well known to from prior admissions. She presents now with chest pain, shortness breath, and increasing weight. She is on peritoneal dialysis. She states that her dry weight is 109 kg. At home she's been as high as 119 kg. She underwent peritoneal dialysis last night under our instruction and UF was slightly over 900 cc.   Objective:  Vital signs in last 24 hours:  Temp:  [97.7 F (36.5 C)-98.5 F (36.9 C)] 97.7 F (36.5 C) (02/11 0449) Pulse Rate:  [72-81] 72 (02/11 0449) Resp:  [16-18] 16 (02/11 0449) BP: (142-190)/(56-67) 142/56 mmHg (02/11 0449) SpO2:  [94 %-100 %] 97 % (02/11 0750) Weight:  [116.2 kg (256 lb 2.8 oz)-119.341 kg (263 lb 1.6 oz)] 119.341 kg (263 lb 1.6 oz) (02/11 0449)  Weight change:  Filed Weights   05/12/15 1235 05/13/15 0449  Weight: 116.2 kg (256 lb 2.8 oz) 119.341 kg (263 lb 1.6 oz)    Intake/Output: I/O last 3 completed shifts: In: 3 [I.V.:3] Out: 650 [Urine:650]   Intake/Output this shift:  Total I/O In: 8000 [Other:8000] Out: 9222 [Urine:300; Other:8922]  Physical Exam: General: NAD, obese  Head: Normocephalic, atraumatic. Moist oral mucosal membranes  Eyes: Anicteric, PERRL  Neck: Supple, trachea midline  Lungs:  Mild basilar rales  Heart: Regular rate and rhythm no rubs  Abdomen:  Soft, nontender, BS present  Extremities: 2+ peripheral edema.  Neurologic: Nonfocal, moving all four extremities  Skin: No lesions  Access: PD catheter in place    Basic Metabolic Panel:  Recent Labs Lab 05/12/15 1300 05/13/15 0355  NA 138 137  K 5.9* 5.0  CL 112* 110  CO2 20* 20*  GLUCOSE 204* 289*  BUN 67* 60*  CREATININE 6.65* 6.64*  CALCIUM 7.9* 7.6*    Liver Function Tests: No results for input(s): AST, ALT, ALKPHOS, BILITOT, PROT, ALBUMIN in the last 168 hours. No results for input(s): LIPASE, AMYLASE in the last 168 hours. No results for input(s): AMMONIA in the last  168 hours.  CBC:  Recent Labs Lab 05/12/15 1300 05/13/15 0355  WBC 8.3 6.4  HGB 9.2* 8.6*  HCT 27.8* 26.6*  MCV 84.3 85.7  PLT 326 281    Cardiac Enzymes:  Recent Labs Lab 05/12/15 1300 05/12/15 1820 05/12/15 2131 05/13/15 0355  TROPONINI <0.03 <0.03 0.04* <0.03    BNP: Invalid input(s): POCBNP  CBG:  Recent Labs Lab 05/12/15 2049 05/13/15 0721  GLUCAP 139* 192*    Microbiology: Results for orders placed or performed during the hospital encounter of 07/16/14  Culture, blood (single)     Status: None   Collection Time: 07/16/14 10:42 PM  Result Value Ref Range Status   Micro Text Report   Final       COMMENT                   NO GROWTH AEROBICALLY/ANAEROBICALLY IN 5 DAYS   ANTIBIOTIC                                                      Culture, blood (single)     Status: None   Collection Time: 07/16/14 10:52 PM  Result Value Ref Range Status   Micro Text Report   Final       COMMENT  NO GROWTH AEROBICALLY/ANAEROBICALLY IN 5 DAYS   ANTIBIOTIC                                                      Wound culture     Status: None   Collection Time: 07/17/14 12:58 AM  Result Value Ref Range Status   Micro Text Report   Final       SOURCE: PERITONITIS    ORGANISM 1                MODERATE GROWTH AEROBIC GRAM POS ROD,NO FURTHER ID   COMMENT                   NO ANAEROBES ISOLATED IN 4 DAYS   GRAM STAIN                NO WBC'S SEEN WHITE BLOOD CELLS   GRAM STAIN                NO ORGANISMS SEEN   ANTIBIOTIC                                                      Body fluid culture     Status: None   Collection Time: 07/17/14  9:00 PM  Result Value Ref Range Status   Micro Text Report   Final       SOURCE: PERITONEAL DIALYSIS    COMMENT                   NO GROWTH AEROBICALLY/ANAEROBICALLY IN 4 DAYS   GRAM STAIN                NO WHITE BLOOD CELLS   GRAM STAIN                NO ORGANISMS SEEN   ANTIBIOTIC                                                         Coagulation Studies: No results for input(s): LABPROT, INR in the last 72 hours.  Urinalysis: No results for input(s): COLORURINE, LABSPEC, PHURINE, GLUCOSEU, HGBUR, BILIRUBINUR, KETONESUR, PROTEINUR, UROBILINOGEN, NITRITE, LEUKOCYTESUR in the last 72 hours.  Invalid input(s): APPERANCEUR    Imaging: Dg Chest 2 View  05/12/2015  CLINICAL DATA:  End-stage renal disease on peritoneal dialysis. Shortness of breath for 3 days. EXAM: CHEST  2 VIEW COMPARISON:  12/31/2013 FINDINGS: Cardiomediastinal silhouette is normal. Mediastinal contours appear intact. There is no evidence of focal airspace consolidation, pleural effusion or pneumothorax. There is mild pulmonary vascular congestion. Osseous structures are without acute abnormality. Soft tissues are grossly normal. IMPRESSION: Mild pulmonary vascular congestion. Electronically Signed   By: Ted Mcalpine M.D.   On: 05/12/2015 13:31     Medications:     . amLODipine  10 mg Oral Daily  . aspirin EC  81 mg Oral Daily  . atorvastatin  20 mg Oral q1800  . buPROPion  150 mg Oral Daily  .  calcium acetate (Phos Binder)  1,334 mg Oral TID WC  . carvedilol  6.25 mg Oral BID WC  . clopidogrel  75 mg Oral Daily  . erythromycin  250 mg Oral QID  . ezetimibe  10 mg Oral Daily  . furosemide  40 mg Intravenous Q12H  . gabapentin  300 mg Oral TID  . heparin  5,000 Units Subcutaneous 3 times per day  . insulin aspart  0-9 Units Subcutaneous TID WC  . insulin aspart  15 Units Subcutaneous TID WC  . insulin glargine  65 Units Subcutaneous QHS  . lisinopril  20 mg Oral QPM  . nicotine  21 mg Transdermal Daily  . pantoprazole  40 mg Oral Daily  . sodium chloride flush  3 mL Intravenous Q12H  . torsemide  100 mg Oral Daily   sodium chloride, acetaminophen **OR** acetaminophen, acetaminophen, albuterol, aspirin-acetaminophen-caffeine, docusate sodium, nitroGLYCERIN, ondansetron **OR** ondansetron (ZOFRAN) IV,  oxyCODONE-acetaminophen, polyethylene glycol, sodium chloride flush  Assessment/ Plan:  50 y.o. female with past medical history of end-stage renal disease on peritoneal dialysis, asthma, gastroparesis, diabetes mellitus, congestive heart failure, hypertension, endometriosis  [facility]/[address]/peritoneal dialysis EDW 109kg.  1. End-stage renal disease on peritoneal dialysis.  Estimated dry weight 109 kg. Currently up to 119 kg. - Proceed with peritoneal dialysis using 4.25% dextrose solution tonight. We will plan for 5 exchanges.  2. Hemoglobin currently 8.6. Start the patient on Epogen 20,000 units subcutaneous weekly.  3. Secondary hyperparathyroidism. Check phosphorus, intact PTH today.  4. Lower extremity edema. As above we are intensifying her peritoneal dialysis to see if we can enhance ultrafiltration.  Also start the patient on Lasix drip in place of IV Lasix.  Stop torsemide for now.  5. Thanks for consultation.  LOS: 1 Ty Oshima 2/11/201710:26 AM

## 2015-05-14 LAB — PHOSPHORUS: Phosphorus: 8 mg/dL — ABNORMAL HIGH (ref 2.5–4.6)

## 2015-05-14 LAB — RENAL FUNCTION PANEL
ANION GAP: 9 (ref 5–15)
Albumin: 2.1 g/dL — ABNORMAL LOW (ref 3.5–5.0)
BUN: 64 mg/dL — ABNORMAL HIGH (ref 6–20)
CO2: 22 mmol/L (ref 22–32)
Calcium: 7.8 mg/dL — ABNORMAL LOW (ref 8.9–10.3)
Chloride: 109 mmol/L (ref 101–111)
Creatinine, Ser: 7.1 mg/dL — ABNORMAL HIGH (ref 0.44–1.00)
GFR calc non Af Amer: 6 mL/min — ABNORMAL LOW (ref >60)
GFR, EST AFRICAN AMERICAN: 7 mL/min — AB (ref >60)
GLUCOSE: 181 mg/dL — AB (ref 65–99)
Phosphorus: 8 mg/dL — ABNORMAL HIGH (ref 2.5–4.6)
Potassium: 4.7 mmol/L (ref 3.5–5.1)
SODIUM: 140 mmol/L (ref 135–145)

## 2015-05-14 LAB — GLUCOSE, CAPILLARY
GLUCOSE-CAPILLARY: 174 mg/dL — AB (ref 65–99)
Glucose-Capillary: 125 mg/dL — ABNORMAL HIGH (ref 65–99)
Glucose-Capillary: 243 mg/dL — ABNORMAL HIGH (ref 65–99)
Glucose-Capillary: 240 mg/dL — ABNORMAL HIGH (ref 65–99)

## 2015-05-14 LAB — MAGNESIUM: MAGNESIUM: 2.4 mg/dL (ref 1.7–2.4)

## 2015-05-14 LAB — MRSA PCR SCREENING: MRSA by PCR: NEGATIVE

## 2015-05-14 MED ORDER — EPOETIN ALFA 40000 UNIT/ML IJ SOLN
20000.0000 [IU] | INTRAMUSCULAR | Status: DC
  Filled 2015-05-14: qty 1

## 2015-05-14 MED ORDER — ISOSORBIDE MONONITRATE ER 30 MG PO TB24
30.0000 mg | ORAL_TABLET | Freq: Every day | ORAL | Status: DC
  Administered 2015-05-14 – 2015-05-16 (×3): 30 mg via ORAL
  Filled 2015-05-14 (×3): qty 1

## 2015-05-14 MED ORDER — CYCLOBENZAPRINE HCL 10 MG PO TABS
10.0000 mg | ORAL_TABLET | Freq: Three times a day (TID) | ORAL | Status: DC | PRN
  Administered 2015-05-14 – 2015-05-15 (×3): 10 mg via ORAL
  Filled 2015-05-14 (×3): qty 1

## 2015-05-14 MED ORDER — EPOETIN ALFA 20000 UNIT/ML IJ SOLN
20000.0000 [IU] | INTRAMUSCULAR | Status: DC
  Filled 2015-05-14: qty 1

## 2015-05-14 MED ORDER — CALCIUM ACETATE (PHOS BINDER) 667 MG/5ML PO SOLN
2001.0000 mg | Freq: Three times a day (TID) | ORAL | Status: DC
  Administered 2015-05-14 – 2015-05-15 (×2): 2001 mg via ORAL
  Filled 2015-05-14 (×5): qty 15

## 2015-05-14 NOTE — Progress Notes (Signed)
NSR. Room air. PD was restarted by Charlie at 1500. FS are stable. Takes meds ok. Pt complained of pain and received meds. Ambulating in the room and tolerating it well. BM today. Pt has no further concerns at this time.

## 2015-05-14 NOTE — Progress Notes (Signed)
HD staff was called, no response. Pt d/c catheter on her own.

## 2015-05-14 NOTE — Progress Notes (Signed)
SUBJECTIVE: Patient denies any further chest pain or shortness of breath   Filed Vitals:   05/13/15 2359 05/14/15 0344 05/14/15 0655 05/14/15 0715  BP: 124/66 144/59  136/57  Pulse: 67 76  72  Temp:  98.3 F (36.8 C)    TempSrc:  Oral    Resp:  18  18  Height:      Weight:   275 lb 8 oz (124.966 kg)   SpO2:  92%  94%    Intake/Output Summary (Last 24 hours) at 05/14/15 1026 Last data filed at 05/14/15 0334  Gross per 24 hour  Intake 521.47 ml  Output    925 ml  Net -403.53 ml    LABS: Basic Metabolic Panel:  Recent Labs  20/94/70 0355 05/14/15 0547  NA 137 140  K 5.0 4.7  CL 110 109  CO2 20* 22  GLUCOSE 289* 181*  BUN 60* 64*  CREATININE 6.64* 7.10*  CALCIUM 7.6* 7.8*  MG  --  2.4  PHOS  --  8.0*  8.0*   Liver Function Tests:  Recent Labs  05/14/15 0547  ALBUMIN 2.1*   No results for input(s): LIPASE, AMYLASE in the last 72 hours. CBC:  Recent Labs  05/12/15 1300 05/13/15 0355  WBC 8.3 6.4  HGB 9.2* 8.6*  HCT 27.8* 26.6*  MCV 84.3 85.7  PLT 326 281   Cardiac Enzymes:  Recent Labs  05/12/15 1820 05/12/15 2131 05/13/15 0355  TROPONINI <0.03 0.04* <0.03   BNP: Invalid input(s): POCBNP D-Dimer: No results for input(s): DDIMER in the last 72 hours. Hemoglobin A1C: No results for input(s): HGBA1C in the last 72 hours. Fasting Lipid Panel: No results for input(s): CHOL, HDL, LDLCALC, TRIG, CHOLHDL, LDLDIRECT in the last 72 hours. Thyroid Function Tests:  Recent Labs  05/12/15 1820  TSH 6.833*   Anemia Panel: No results for input(s): VITAMINB12, FOLATE, FERRITIN, TIBC, IRON, RETICCTPCT in the last 72 hours.   PHYSICAL EXAM General: Well developed, well nourished, in no acute distress HEENT:  Normocephalic and atramatic Neck:  No JVD.  Lungs: Clear bilaterally to auscultation and percussion. Heart: HRRR . Normal S1 and S2 without gallops or murmurs.  Abdomen: Bowel sounds are positive, abdomen soft and non-tender  Msk:  Back  normal, normal gait. Normal strength and tone for age. Extremities: No clubbing, cyanosis or edema.   Neuro: Alert and oriented X 3. Psych:  Good affect, responds appropriately  TELEMETRY: Sinus rhythm  ASSESSMENT AND PLAN: Chest pain with 1 out of 3 troponins being mildly elevated in no acute EKG changes and no chest pain. Patient patient can go home with follow-up in the office on Thursday at 1 PM and will do cc CTA of the coronaries.   Laurier Nancy, MD, [employer] 05/14/2015 10:26 AM

## 2015-05-14 NOTE — Care Management (Signed)
ESRD on peritoneal dialysis.  Cardiology is considering possible cardiac cath 2/13.  Had stress test at [facility] approximately one month ago which was negative.  Troponins are negative

## 2015-05-14 NOTE — Progress Notes (Signed)
[facility] [place] [facility]                                                                                                                                                                                            Patient Demographics   Kimberly Montgomery, is a 50 y.o. female, DOB - Aug 10, 1965, VOZ:[id number]  Admit date - 05/12/2015   Admitting Physician Auburn Bilberry, MD  Outpatient Primary MD for the patient is No primary care provider on file.   LOS - 2  Subjective:  Complains of cramping in the lower extremity. Denies any chest pains complains of cough and congestion   Review of Systems:   CONSTITUTIONAL: No documented fever. No fatigue, weakness. No weight gain, no weight loss.  EYES: No blurry or double vision.  ENT: No tinnitus. No postnasal drip. No redness of the oropharynx.  RESPIRATORY: Positive cough and congestion, positive wheeze, no hemoptysis. Positive dyspnea.  CARDIOVASCULAR: No chest pain. No orthopnea. No palpitations. No syncope.  GASTROINTESTINAL: No nausea, no vomiting or diarrhea. No abdominal pain. No melena or hematochezia.  GENITOURINARY: No dysuria or hematuria.  ENDOCRINE: No polyuria or nocturia. No heat or cold intolerance.  HEMATOLOGY: No anemia. No bruising. No bleeding.  INTEGUMENTARY: No rashes. No lesions.  MUSCULOSKELETAL: No arthritis. No swelling. No gout. Positive Muscle cramping NEUROLOGIC: No numbness, tingling, or ataxia. No seizure-type activity.  PSYCHIATRIC: No anxiety. No insomnia. No ADD.    Vitals:   Filed Vitals:   05/13/15 2359 05/14/15 0344 05/14/15 0655 05/14/15 0715  BP: 124/66 144/59  136/57  Pulse: 67 76  72  Temp:  98.3 F (36.8 C)    TempSrc:  Oral    Resp:  18  18  Height:      Weight:   124.966 kg (275 lb 8 oz)   SpO2:  92%  94%    Wt Readings from Last 3 Encounters:  05/14/15 124.966 kg (275 lb 8 oz)     Intake/Output Summary (Last 24 hours) at 05/14/15 0827 Last data filed  at 05/14/15 0334  Gross per 24 hour  Intake 8761.47 ml  Output  42595 ml  Net -1385.53 ml    Physical Exam:   GENERAL: Pleasant-appearing in no apparent distress.  HEAD, EYES, EARS, NOSE AND THROAT: Atraumatic, normocephalic. Extraocular muscles are intact. Pupils equal and reactive to light. Sclerae anicteric. No conjunctival injection. No oro-pharyngeal erythema.  NECK: Supple. There is no jugular venous distention. No bruits, no lymphadenopathy, no thyromegaly.  HEART: Regular rate and rhythm,. No murmurs, no rubs, no clicks.  LUNGS: Bilateral occasional wheezing  ABDOMEN: Soft,  flat, nontender, nondistended. Has good bowel sounds. No hepatosplenomegaly appreciated.  EXTREMITIES: No evidence of any cyanosis, clubbing, or peripheral edema.  +2 pedal and radial pulses bilaterally.  NEUROLOGIC: The patient is alert, awake, and oriented x3 with no focal motor or sensory deficits appreciated bilaterally.  SKIN: Moist and warm with no rashes appreciated.  Psych: Not anxious, depressed LN: No inguinal LN enlargement    Antibiotics   Anti-infectives    Start     Dose/Rate Route Frequency Ordered Stop   05/12/15 1800  erythromycin (ERY-TAB) EC tablet 250 mg     250 mg Oral 4 times daily 05/12/15 1603        Medications   Scheduled Meds: . amLODipine  10 mg Oral Daily  . aspirin EC  81 mg Oral Daily  . atorvastatin  20 mg Oral q1800  . buPROPion  150 mg Oral Daily  . calcium acetate (Phos Binder)  1,334 mg Oral TID WC  . carvedilol  6.25 mg Oral BID WC  . clopidogrel  75 mg Oral Daily  . dialysis solution 4.25% low-MG/low-CA   Intraperitoneal Q24H  . erythromycin  250 mg Oral QID  . ezetimibe  10 mg Oral Daily  . gabapentin  300 mg Oral TID  . gentamicin cream  1 application Topical Daily  . heparin  5,000 Units Subcutaneous 3 times per day  . insulin aspart  0-9 Units Subcutaneous TID WC  . insulin glargine  40 Units Subcutaneous QHS  . lisinopril  20 mg Oral QPM  .  nicotine  21 mg Transdermal Daily  . pantoprazole  40 mg Oral Daily  . sodium chloride flush  3 mL Intravenous Q12H   Continuous Infusions: . furosemide (LASIX) infusion 8 mg/hr (05/13/15 1249)   PRN Meds:.sodium chloride, acetaminophen **OR** acetaminophen, acetaminophen, albuterol, aspirin-acetaminophen-caffeine, cyclobenzaprine, docusate sodium, nitroGLYCERIN, ondansetron **OR** ondansetron (ZOFRAN) IV, oxyCODONE-acetaminophen, polyethylene glycol, sodium chloride flush   Data Review:   Micro Results No results found for this or any previous visit (from the past 240 hour(s)).  Radiology Reports Dg Chest 2 View  05/12/2015  CLINICAL DATA:  End-stage renal disease on peritoneal dialysis. Shortness of breath for 3 days. EXAM: CHEST  2 VIEW COMPARISON:  12/31/2013 FINDINGS: Cardiomediastinal silhouette is normal. Mediastinal contours appear intact. There is no evidence of focal airspace consolidation, pleural effusion or pneumothorax. There is mild pulmonary vascular congestion. Osseous structures are without acute abnormality. Soft tissues are grossly normal. IMPRESSION: Mild pulmonary vascular congestion. Electronically Signed   By: Ted Mcalpine M.D.   On: 05/12/2015 13:31     CBC  Recent Labs Lab 05/12/15 1300 05/13/15 0355  WBC 8.3 6.4  HGB 9.2* 8.6*  HCT 27.8* 26.6*  PLT 326 281  MCV 84.3 85.7  MCH 27.9 27.9  MCHC 33.1 32.5  RDW 14.5 14.9*    Chemistries   Recent Labs Lab 05/12/15 1300 05/13/15 0355 05/14/15 0547  NA 138 137 140  K 5.9* 5.0 4.7  CL 112* 110 109  CO2 20* 20* 22  GLUCOSE 204* 289* 181*  BUN 67* 60* 64*  CREATININE 6.65* 6.64* 7.10*  CALCIUM 7.9* 7.6* 7.8*   ------------------------------------------------------------------------------------------------------------------ estimated creatinine clearance is 13.3 mL/min (by C-G formula based on Cr of  7.1). ------------------------------------------------------------------------------------------------------------------ No results for input(s): HGBA1C in the last 72 hours. ------------------------------------------------------------------------------------------------------------------ No results for input(s): CHOL, HDL, LDLCALC, TRIG, CHOLHDL, LDLDIRECT in the last 72 hours. ------------------------------------------------------------------------------------------------------------------  Recent Labs  05/12/15 1820  TSH 6.833*   ------------------------------------------------------------------------------------------------------------------ No results  for input(s): VITAMINB12, FOLATE, FERRITIN, TIBC, IRON, RETICCTPCT in the last 72 hours.  Coagulation profile No results for input(s): INR, PROTIME in the last 168 hours.  No results for input(s): DDIMER in the last 72 hours.  Cardiac Enzymes  Recent Labs Lab 05/12/15 1820 05/12/15 2131 05/13/15 0355  TROPONINI <0.03 0.04* <0.03   ------------------------------------------------------------------------------------------------------------------ Invalid input(s): POCBNP    Assessment & Plan   IMPRESSION AND PLAN: Patient is a 50 year old white female on her toenail dialysis presents with shortness of breath chest pressure  1. Acute CHF type unknown/fluid overload Continue PD, continue IV Lasix and Demadex.  2. Hyperkalemia status post treatment with Kayexalate resolved  3. Diabetes type 2: Continue sliding scale insulin and home regimen  4. Hypertension continue Norvasc and Coreg blood pressure stable  5. Hyperlipidemia continue cholesterol-lowering lowering regimen  6. GERD continue omeprazole  7. Chest pain negative cardiac enzymes recent stress test in December which was negative symptoms likely related to fluid overload appreciate cardiology input  8. Muscle spasms check magnesium increase Flexeril dose      Code Status Orders        Start     Ordered   05/12/15 1546  Full code   Continuous     05/12/15 1546    Code Status History    Date Active Date Inactive Code Status Order ID Comments User Context   This patient has a current code status but no historical code status.           Consults  nephrology DVT Prophylaxis  heparin  Lab Results  Component Value Date   PLT 281 05/13/2015     Time Spent in minutes   Auburn Bilberry M.D on 05/14/2015 at 8:27 AM  Between 7am to 6pm - Pager - [phone]  After 6pm go to [email or web address] - password [email or web address] [facility]  [facility] [place] [facility]   Office  [phone]

## 2015-05-14 NOTE — Progress Notes (Signed)
[facility]  ROUNDING NOTE   Subjective:  Patient doing well with peritoneal dialysis. We used 4.25% dextrose solution last night. Had enhanced ultrafiltration of 3.6 L. Had some cramping but was able to tolerate this.   Objective:  Vital signs in last 24 hours:  Temp:  [98.2 F (36.8 C)-98.7 F (37.1 C)] 98.7 F (37.1 C) (02/12 1108) Pulse Rate:  [67-76] 73 (02/12 1108) Resp:  [18] 18 (02/12 1108) BP: (124-144)/(55-79) 134/79 mmHg (02/12 1108) SpO2:  [92 %-99 %] 99 % (02/12 1108) Weight:  [124.966 kg (275 lb 8 oz)] 124.966 kg (275 lb 8 oz) (02/12 0655)  Weight change: 8.766 kg (19 lb 5.2 oz) Filed Weights   05/12/15 1235 05/13/15 0449 05/14/15 0655  Weight: 116.2 kg (256 lb 2.8 oz) 119.341 kg (263 lb 1.6 oz) 124.966 kg (275 lb 8 oz)    Intake/Output: I/O last 3 completed shifts: In: 8764.5 [P.O.:720; I.V.:44.5; Other:8000] Out: 40981 [Urine:1875; Other:8922]   Intake/Output this shift:  Total I/O In: 240 [P.O.:240] Out: 1 [Urine:1]  Physical Exam: General: NAD, obese  Head: Normocephalic, atraumatic. 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 No results for input(s): AMMONIA in the last 168  hours.  CBC:  Recent Labs Lab 05/12/15 1300 05/13/15 0355  WBC 8.3 6.4  HGB 9.2* 8.6*  HCT 27.8* 26.6*  MCV 84.3 85.7  PLT 326 281    Cardiac Enzymes:  Recent Labs Lab 05/12/15 1300 05/12/15 1820 05/12/15 2131 05/13/15 0355  TROPONINI <0.03 <0.03 0.04* <0.03    BNP: Invalid input(s): POCBNP  CBG:  Recent Labs Lab 05/13/15 1429 05/13/15 1603 05/13/15 2118 05/14/15 0736 05/14/15 1107  GLUCAP 179* 244* 200* 125* 243*    Microbiology: Results for orders placed or performed during the hospital encounter of 07/16/14  Culture, blood (single)     Status: None   Collection Time: 07/16/14 10:42 PM  Result Value Ref Range Status   Micro Text Report   Final       COMMENT                   NO GROWTH AEROBICALLY/ANAEROBICALLY IN 5 DAYS   ANTIBIOTIC                                                      Culture, blood (single)     Status: None   Collection Time: 07/16/14 10:52 PM  Result  Value Ref Range Status   Micro Text Report   Final       COMMENT                   NO GROWTH AEROBICALLY/ANAEROBICALLY IN 5 DAYS   ANTIBIOTIC                                                      Wound culture     Status: None   Collection Time: 07/17/14 12:58 AM  Result Value Ref Range Status   Micro Text Report   Final       SOURCE: PERITONITIS    ORGANISM 1                MODERATE GROWTH AEROBIC GRAM POS ROD,NO FURTHER ID   COMMENT                   NO ANAEROBES ISOLATED IN 4 DAYS   GRAM STAIN                NO WBC'S SEEN WHITE BLOOD CELLS   GRAM STAIN                NO ORGANISMS SEEN   ANTIBIOTIC                                                      Body fluid culture     Status: None   Collection Time: 07/17/14  9:00 PM  Result Value Ref Range Status   Micro Text Report   Final       SOURCE: PERITONEAL DIALYSIS    COMMENT                   NO GROWTH AEROBICALLY/ANAEROBICALLY IN 4 DAYS   GRAM STAIN                NO WHITE BLOOD CELLS   GRAM STAIN                 NO ORGANISMS SEEN   ANTIBIOTIC                                                        Coagulation Studies: No results for input(s): LABPROT, INR in the last 72 hours.  Urinalysis: No results for input(s): COLORURINE, LABSPEC, PHURINE, GLUCOSEU, HGBUR, BILIRUBINUR, KETONESUR, PROTEINUR, UROBILINOGEN, NITRITE, LEUKOCYTESUR in the last 72 hours.  Invalid input(s): APPERANCEUR    Imaging: Dg Chest 2 View  05/12/2015  CLINICAL DATA:  End-stage renal disease on peritoneal dialysis. Shortness of breath for 3 days. EXAM: CHEST  2 VIEW COMPARISON:  12/31/2013 FINDINGS: Cardiomediastinal silhouette is normal. Mediastinal contours appear intact. There is no evidence of focal airspace consolidation, pleural effusion or pneumothorax. There is mild pulmonary vascular congestion. Osseous structures are without acute abnormality. Soft tissues are grossly normal. IMPRESSION: Mild pulmonary vascular congestion. Electronically Signed   By: Ted Mcalpine M.D.   On: 05/12/2015 13:31  Medications:   . furosemide (LASIX) infusion 8 mg/hr (05/13/15 1249)   . amLODipine  10 mg Oral Daily  . aspirin EC  81 mg Oral Daily  . atorvastatin  20 mg Oral q1800  . buPROPion  150 mg Oral Daily  . calcium acetate (Phos Binder)  1,334 mg Oral TID WC  . carvedilol  6.25 mg Oral BID WC  . clopidogrel  75 mg Oral Daily  . dialysis solution 4.25% low-MG/low-CA   Intraperitoneal Q24H  . erythromycin  250 mg Oral QID  . ezetimibe  10 mg Oral Daily  . gabapentin  300 mg Oral TID  . gentamicin cream  1 application Topical Daily  . heparin  5,000 Units Subcutaneous 3 times per day  . insulin aspart  0-9 Units Subcutaneous TID WC  . insulin glargine  40 Units Subcutaneous QHS  . isosorbide mononitrate  30 mg Oral Daily  . lisinopril  20 mg Oral QPM  . nicotine  21 mg Transdermal Daily  . pantoprazole  40 mg Oral Daily  . sodium chloride flush  3 mL Intravenous Q12H   sodium chloride, acetaminophen **OR**  acetaminophen, acetaminophen, albuterol, aspirin-acetaminophen-caffeine, cyclobenzaprine, docusate sodium, nitroGLYCERIN, ondansetron **OR** ondansetron (ZOFRAN) IV, oxyCODONE-acetaminophen, polyethylene glycol, sodium chloride flush  Assessment/ Plan:  50 y.o. female with past medical history of end-stage renal disease on peritoneal dialysis, asthma, gastroparesis, diabetes mellitus, congestive heart failure, hypertension, endometriosis  [facility]/[address]/peritoneal dialysis EDW 109kg.  1. End-stage renal disease on peritoneal dialysis.  Estimated dry weight 109 kg. Currently up to 119 kg at admission. - Patient had 3.6 L of ultrafiltration overnight. However her weight is currently 124 kg however we question the accuracy of this. We will proceed with peritoneal dialysis again tonight using 4.25% dextrose solution as we did last night-she had good ultrafiltration to this.  2. Anemia of CKD:  Continue Epogen 20,000 units subcutaneous weekly.  3. Secondary hyperparathyroidism. Phosphorus very high at 8.0. Increase PhosLo to 3 tablets by mouth 3 times a day with meals.  4. Lower extremity edema. Seems improved.  Had good urine output. Continue Lasix drip as well as intensified peritoneal dialysis ultrafiltration.   LOS: 2 Xzander Gilham 2/12/201711:34 AM

## 2015-05-15 ENCOUNTER — Inpatient Hospital Stay
Admit: 2015-05-15 | Discharge: 2015-05-15 | Disposition: A | Discharge status: Home / Self Care | Payer: Medicare HMO | Attending: Cardiovascular Disease | Admitting: Cardiovascular Disease

## 2015-05-15 LAB — RENAL FUNCTION PANEL
ALBUMIN: 2 g/dL — AB (ref 3.5–5.0)
Anion gap: 7 (ref 5–15)
BUN: 64 mg/dL — AB (ref 6–20)
CALCIUM: 7.8 mg/dL — AB (ref 8.9–10.3)
CO2: 24 mmol/L (ref 22–32)
CREATININE: 7.86 mg/dL — AB (ref 0.44–1.00)
Chloride: 108 mmol/L (ref 101–111)
GFR calc Af Amer: 6 mL/min — ABNORMAL LOW (ref >60)
GFR, EST NON AFRICAN AMERICAN: 5 mL/min — AB (ref >60)
GLUCOSE: 120 mg/dL — AB (ref 65–99)
PHOSPHORUS: 8.3 mg/dL — AB (ref 2.5–4.6)
Potassium: 4.6 mmol/L (ref 3.5–5.1)
SODIUM: 139 mmol/L (ref 135–145)

## 2015-05-15 LAB — GLUCOSE, CAPILLARY
GLUCOSE-CAPILLARY: 110 mg/dL — AB (ref 65–99)
GLUCOSE-CAPILLARY: 138 mg/dL — AB (ref 65–99)
Glucose-Capillary: 129 mg/dL — ABNORMAL HIGH (ref 65–99)
Glucose-Capillary: 111 mg/dL — ABNORMAL HIGH (ref 65–99)
Glucose-Capillary: 172 mg/dL — ABNORMAL HIGH (ref 65–99)

## 2015-05-15 LAB — PARATHYROID HORMONE, INTACT (NO CA): PTH: 206 pg/mL — ABNORMAL HIGH (ref 15–65)

## 2015-05-15 MED ORDER — AZITHROMYCIN 250 MG PO TABS
500.0000 mg | ORAL_TABLET | Freq: Every day | ORAL | Status: AC
  Administered 2015-05-15: 500 mg via ORAL
  Filled 2015-05-15: qty 2

## 2015-05-15 MED ORDER — AZITHROMYCIN 250 MG PO TABS: 250.0000 mg | ORAL_TABLET | Freq: Every day | ORAL | Status: DC

## 2015-05-15 MED ORDER — LEVOFLOXACIN 500 MG PO TABS: 250.0000 mg | ORAL_TABLET | ORAL | Status: DC

## 2015-05-15 MED ORDER — DELFLEX-LC/2.5% DEXTROSE 394 MOSM/L IP SOLN
INTRAPERITONEAL | Status: DC
  Administered 2015-05-15: 16:00:00 via INTRAPERITONEAL
  Filled 2015-05-15 (×2): qty 3000

## 2015-05-15 MED ORDER — CALCIUM ACETATE (PHOS BINDER) 667 MG/5ML PO SOLN
2668.0000 mg | Freq: Three times a day (TID) | ORAL | Status: DC
  Administered 2015-05-15 – 2015-05-16 (×4): 2668 mg via ORAL
  Filled 2015-05-15 (×8): qty 20

## 2015-05-15 MED ORDER — TORSEMIDE 20 MG PO TABS
100.0000 mg | ORAL_TABLET | Freq: Every day | ORAL | Status: DC
  Administered 2015-05-15 – 2015-05-16 (×2): 100 mg via ORAL
  Filled 2015-05-15 (×2): qty 5

## 2015-05-15 MED ORDER — GUAIFENESIN ER 600 MG PO TB12
600.0000 mg | ORAL_TABLET | Freq: Two times a day (BID) | ORAL | Status: DC
  Administered 2015-05-15 – 2015-05-16 (×3): 600 mg via ORAL
  Filled 2015-05-15 (×3): qty 1

## 2015-05-15 MED ORDER — LEVOFLOXACIN 500 MG PO TABS
500.0000 mg | ORAL_TABLET | Freq: Once | ORAL | Status: AC
  Administered 2015-05-16: 500 mg via ORAL
  Filled 2015-05-15: qty 1

## 2015-05-15 NOTE — Progress Notes (Signed)
*  PRELIMINARY RESULTS* °Echocardiogram °2D Echocardiogram has been performed. ° °Kimberly Montgomery °05/15/2015, 11:13 AM °

## 2015-05-15 NOTE — Care Management Important Message (Signed)
Important Message  Patient Details  Name: Kimberly Montgomery MRN: [id number] Date of Birth: [birth year]   Medicare Important Message Given:  Yes    [name] A Brittyn Salaz 05/15/2015, 2:57 PM

## 2015-05-15 NOTE — Progress Notes (Signed)
PD was initiated before 1900, completed at 0100. Pt is resting in bed without any complications. Will continue to monitor and assess.

## 2015-05-15 NOTE — Progress Notes (Signed)
[facility] - [place] at [facility]                                                                                                                                                                                            Patient Demographics   Kimberly Montgomery, is a 50 y.o. female, DOB - 03-05-1966, ZOX:[id number]  Admit date - 05/12/2015   Admitting Physician Auburn Bilberry, MD  Outpatient Primary MD for the patient is No primary care provider on file.   LOS - 3  Subjective:  Complains of cough and congestion, feeling better from shortness of breath  Review of Systems:   CONSTITUTIONAL: No documented fever. No fatigue, weakness. No weight gain, no weight loss.  EYES: No blurry or double vision.  ENT: No tinnitus. No postnasal drip. No redness of the oropharynx.  RESPIRATORY: Positive cough and congestion, positive wheeze, no hemoptysis. Improved CARDIOVASCULAR: No chest pain. No orthopnea. No palpitations. No syncope.  GASTROINTESTINAL: No nausea, no vomiting or diarrhea. No abdominal pain. No melena or hematochezia.  GENITOURINARY: No dysuria or hematuria.  ENDOCRINE: No polyuria or nocturia. No heat or cold intolerance.  HEMATOLOGY: No anemia. No bruising. No bleeding.  INTEGUMENTARY: No rashes. No lesions.  MUSCULOSKELETAL: No arthritis. No swelling. No gout. Positive Muscle cramping NEUROLOGIC: No numbness, tingling, or ataxia. No seizure-type activity.  PSYCHIATRIC: No anxiety. No insomnia. No ADD.    Vitals:   Filed Vitals:   05/14/15 1108 05/14/15 1956 05/15/15 0425 05/15/15 0735  BP: 134/79 105/48 123/53 108/57  Pulse: 73 66 67 70  Temp: 98.7 F (37.1 C) 98 F (36.7 C) 98.1 F (36.7 C)   TempSrc: Oral Oral Oral   Resp: Height:      Weight:   118.117 kg (260 lb 6.4 oz)   SpO2: 99% 95%  94%    Wt Readings from Last 3 Encounters:  05/15/15 118.117 kg (260 lb 6.4 oz)     Intake/Output Summary (Last 24 hours) at 05/15/15  0834 Last data filed at 05/15/15 0656  Gross per 24 hour  Intake 10575.47 ml  Output  81191 ml  Net -3980.53 ml    Physical Exam:   GENERAL: Pleasant-appearing in no apparent distress.  HEAD, EYES, EARS, NOSE AND THROAT: Atraumatic, normocephalic. Extraocular muscles are intact. Pupils equal and reactive to light. Sclerae anicteric. No conjunctival injection. No oro-pharyngeal erythema.  NECK: Supple. There is no jugular venous distention. No bruits, no lymphadenopathy, no thyromegaly.  HEART: Regular rate and rhythm,. No murmurs, no rubs, no clicks.  LUNGS: There is no wheezing no rhonchi ABDOMEN: Soft,  flat, nontender, nondistended. Has good bowel sounds. No hepatosplenomegaly appreciated.  EXTREMITIES: No evidence of any cyanosis, clubbing, or peripheral edema.  +2 pedal and radial pulses bilaterally.  NEUROLOGIC: The patient is alert, awake, and oriented x3 with no focal motor or sensory deficits appreciated bilaterally.  SKIN: Moist and warm with no rashes appreciated.  Psych: Not anxious, depressed LN: No inguinal LN enlargement    Antibiotics   Anti-infectives    Start     Dose/Rate Route Frequency Ordered Stop   05/16/15 1000  azithromycin (ZITHROMAX) tablet 250 mg     250 mg Oral Daily 05/15/15 0833 05/20/15 0959   05/15/15 1000  azithromycin (ZITHROMAX) tablet 500 mg     500 mg Oral Daily 05/15/15 0833 05/16/15 0959   05/12/15 1800  erythromycin (ERY-TAB) EC tablet 250 mg     250 mg Oral 4 times daily 05/12/15 1603        Medications   Scheduled Meds: . amLODipine  10 mg Oral Daily  . aspirin EC  81 mg Oral Daily  . atorvastatin  20 mg Oral q1800  . azithromycin  500 mg Oral Daily   Followed by  . [START ON 05/16/2015] azithromycin  250 mg Oral Daily  . buPROPion  150 mg Oral Daily  . calcium acetate (Phos Binder)  2,001 mg Oral TID WC  . carvedilol  6.25 mg Oral BID WC  . clopidogrel  75 mg Oral Daily  . dialysis solution 4.25% low-MG/low-CA    Intraperitoneal Q24H  . epoetin (EPOGEN/PROCRIT) injection  20,000 Units Subcutaneous Weekly  . erythromycin  250 mg Oral QID  . ezetimibe  10 mg Oral Daily  . gabapentin  300 mg Oral TID  . gentamicin cream  1 application Topical Daily  . guaiFENesin  600 mg Oral BID  . heparin  5,000 Units Subcutaneous 3 times per day  . insulin aspart  0-9 Units Subcutaneous TID WC  . insulin glargine  40 Units Subcutaneous QHS  . isosorbide mononitrate  30 mg Oral Daily  . lisinopril  20 mg Oral QPM  . nicotine  21 mg Transdermal Daily  . pantoprazole  40 mg Oral Daily  . sodium chloride flush  3 mL Intravenous Q12H   Continuous Infusions: . furosemide (LASIX) infusion 8 mg/hr (05/14/15 1900)   PRN Meds:.sodium chloride, acetaminophen **OR** acetaminophen, acetaminophen, albuterol, aspirin-acetaminophen-caffeine, cyclobenzaprine, docusate sodium, nitroGLYCERIN, ondansetron **OR** ondansetron (ZOFRAN) IV, oxyCODONE-acetaminophen, polyethylene glycol, sodium chloride flush   Data Review:   Micro Results Recent Results (from the past 240 hour(s))  MRSA PCR Screening     Status: None   Collection Time: 05/14/15  8:13 PM  Result Value Ref Range Status   MRSA by PCR NEGATIVE NEGATIVE Final    Comment:        The GeneXpert MRSA Assay (FDA approved for NASAL specimens only), is one component of a comprehensive MRSA colonization surveillance program. It is not intended to diagnose MRSA infection nor to guide or monitor treatment for MRSA infections.     Radiology Reports Dg Chest 2 View  05/12/2015  CLINICAL DATA:  End-stage renal disease on peritoneal dialysis. Shortness of breath for 3 days. EXAM: CHEST  2 VIEW COMPARISON:  12/31/2013 FINDINGS: Cardiomediastinal silhouette is normal. Mediastinal contours appear intact. There is no evidence of focal airspace consolidation, pleural effusion or pneumothorax. There is mild pulmonary vascular congestion. Osseous structures are without acute  abnormality. Soft tissues are grossly normal. IMPRESSION: Mild pulmonary vascular congestion. Electronically Signed  By: Ted Mcalpine M.D.   On: 05/12/2015 13:31     CBC  Recent Labs Lab 05/12/15 1300 05/13/15 0355  WBC 8.3 6.4  HGB 9.2* 8.6*  HCT 27.8* 26.6*  PLT 326 281  MCV 84.3 85.7  MCH 27.9 27.9  MCHC 33.1 32.5  RDW 14.5 14.9*    Chemistries   Recent Labs Lab 05/12/15 1300 05/13/15 0355 05/14/15 0547 05/15/15 0513  NA 138 137 140 139  K 5.9* 5.0 4.7 4.6  CL 112* 110 109 108  CO2 20* 20* 22 24  GLUCOSE 204* 289* 181* 120*  BUN 67* 60* 64* 64*  CREATININE 6.65* 6.64* 7.10* 7.86*  CALCIUM 7.9* 7.6* 7.8* 7.8*  MG  --   --  2.4  --    ------------------------------------------------------------------------------------------------------------------ estimated creatinine clearance is 11.6 mL/min (by C-G formula based on Cr of 7.86). ------------------------------------------------------------------------------------------------------------------ No results for input(s): HGBA1C in the last 72 hours. ------------------------------------------------------------------------------------------------------------------ No results for input(s): CHOL, HDL, LDLCALC, TRIG, CHOLHDL, LDLDIRECT in the last 72 hours. ------------------------------------------------------------------------------------------------------------------  Recent Labs  05/12/15 1820  TSH 6.833*   ------------------------------------------------------------------------------------------------------------------ No results for input(s): VITAMINB12, FOLATE, FERRITIN, TIBC, IRON, RETICCTPCT in the last 72 hours.  Coagulation profile No results for input(s): INR, PROTIME in the last 168 hours.  No results for input(s): DDIMER in the last 72 hours.  Cardiac Enzymes  Recent Labs Lab 05/12/15 1820 05/12/15 2131 05/13/15 0355  TROPONINI <0.03 0.04* <0.03    ------------------------------------------------------------------------------------------------------------------ Invalid input(s): POCBNP    Assessment & Plan   IMPRESSION AND PLAN: Patient is a 50 year old white female on her toenail dialysis presents with shortness of breath chest pressure  1. Acute CHF type unknown/fluid overload Patient diuresing well continue Lasix drip and paratonia dialysis Demadex discontinued  2. Hyperkalemia status post treatment with Kayexalate resolved  3. Diabetes type 2: Continue sliding scale insulin and home regimen  4. Hypertension continue Norvasc and Coreg blood pressure stable  5. Hyperlipidemia continue cholesterol-lowering lowering regimen  6. GERD continue omeprazole  7. Chest pain negative cardiac enzymes recent stress test in December which was negative symptoms likely related to fluid overload appreciate cardiology input outpatient follow-up  8. Muscle spasms continue Flexeril  9. Acute bronchitis. I will place patient on Levaquin    Code Status Orders        Start     Ordered   05/12/15 1546  Full code   Continuous     05/12/15 1546    Code Status History    Date Active Date Inactive Code Status Order ID Comments User Context   This patient has a current code status but no historical code status.           Consults  nephrology DVT Prophylaxis  heparin  Lab Results  Component Value Date   PLT 281 05/13/2015     Time Spent in minutes   Dvonte Gatliff M.D on 05/15/2015 at 8:34 AM  Between 7am to 6pm - Pager - [phone]  After 6pm go to [email or web address] - password [email or web address] [facility]  [facility]   Office  [phone]

## 2015-05-15 NOTE — Care Management Important Message (Signed)
Important Message  Patient Details  Name: Kimberly Montgomery MRN: [id number] Date of Birth: Mar 03, 1966   Medicare Important Message Given:       Verita Schneiders Analiyah Lechuga 05/15/2015, 2:55 PM

## 2015-05-15 NOTE — Progress Notes (Signed)
SUBJECTIVE: Patient has no longer chest pain   Filed Vitals:   05/14/15 1108 05/14/15 1956 05/15/15 0425 05/15/15 0735  BP: 134/79 105/48 123/53 108/57  Pulse: 73 66 67 70  Temp: 98.7 F (37.1 C) 98 F (36.7 C) 98.1 F (36.7 C)   TempSrc: Oral Oral Oral   Resp: 18 16 16 18   Height:      Weight:   260 lb 6.4 oz (118.117 kg)   SpO2: 99% 95%  94%    Intake/Output Summary (Last 24 hours) at 05/15/15 0854 Last data filed at 05/15/15 0656  Gross per 24 hour  Intake 10575.47 ml  Output  11021 ml  Net -3980.53 ml    LABS: Basic Metabolic Panel:  Recent Labs  11/73/56 0547 05/15/15 0513  NA 140 139  K 4.7 4.6  CL 109 108  CO2 22 24  GLUCOSE 181* 120*  BUN 64* 64*  CREATININE 7.10* 7.86*  CALCIUM 7.8* 7.8*  MG 2.4  --   PHOS 8.0*  8.0* 8.3*   Liver Function Tests:  Recent Labs  05/14/15 0547 05/15/15 0513  ALBUMIN 2.1* 2.0*   No results for input(s): LIPASE, AMYLASE in the last 72 hours. CBC:  Recent Labs  05/12/15 1300 05/13/15 0355  WBC 8.3 6.4  HGB 9.2* 8.6*  HCT 27.8* 26.6*  MCV 84.3 85.7  PLT 326 281   Cardiac Enzymes:  Recent Labs  05/12/15 1820 05/12/15 2131 05/13/15 0355  TROPONINI <0.03 0.04* <0.03   BNP: Invalid input(s): POCBNP D-Dimer: No results for input(s): DDIMER in the last 72 hours. Hemoglobin A1C: No results for input(s): HGBA1C in the last 72 hours. Fasting Lipid Panel: No results for input(s): CHOL, HDL, LDLCALC, TRIG, CHOLHDL, LDLDIRECT in the last 72 hours. Thyroid Function Tests:  Recent Labs  05/12/15 1820  TSH 6.833*   Anemia Panel: No results for input(s): VITAMINB12, FOLATE, FERRITIN, TIBC, IRON, RETICCTPCT in the last 72 hours.   PHYSICAL EXAM General: Well developed, well nourished, in no acute distress HEENT:  Normocephalic and atramatic Neck:  No JVD.  Lungs: Clear bilaterally to auscultation and percussion. Heart: HRRR . Normal S1 and S2 without gallops or murmurs.  Abdomen: Bowel sounds are  positive, abdomen soft and non-tender  Msk:  Back normal, normal gait. Normal strength and tone for age. Extremities: No clubbing, cyanosis or edema.   Neuro: Alert and oriented X 3. Psych:  Good affect, responds appropriately  TELEMETRY: Sinus rhythm  ASSESSMENT AND PLAN: Chest pain has resolved with MI being ruled out but has known coronary artery disease with PCI and stenting. Advise follow-up Thursday at 1 PM.  Active Problems:   Chest pain    Lash Matulich A, MD, [facility] 05/15/2015 8:54 AM

## 2015-05-15 NOTE — Plan of Care (Signed)
Problem: Phase I Progression Outcomes Goal: Anginal pain relieved Outcome: Completed/Met Date Met:  05/15/15 Pt complains of abdominal pain due to new dialysis treatment, relieved with pain medication.

## 2015-05-15 NOTE — Progress Notes (Signed)
[facility]  ROUNDING NOTE   Subjective:   Peritoneal dialysis last night. UF of 3147. Also on furosemide gtt /hr.    Objective:  Vital signs in last 24 hours:  Temp:  [98 F (36.7 C)-98.7 F (37.1 C)] 98.1 F (36.7 C) (02/13 0425) Pulse Rate:  [66-73] 70 (02/13 0735) Resp:  [16-18] 18 (02/13 0900) BP: (105-134)/(48-79) 105/54 mmHg (02/13 0900) SpO2:  [94 %-99 %] 94 % (02/13 0900) Weight:  [118.117 kg (260 lb 6.4 oz)] 118.117 kg (260 lb 6.4 oz) (02/13 0425)  Weight change: -6.849 kg (-15 lb 1.6 oz) Filed Weights   05/13/15 0449 05/14/15 0655 05/15/15 0425  Weight: 119.341 kg (263 lb 1.6 oz) 124.966 kg (275 lb 8 oz) 118.117 kg (260 lb 6.4 oz)    Intake/Output: I/O last 3 completed shifts: In: 10815.5 [P.O.:720; I.V.:95.5; Other:10000] Out: 16109 [Urine:1251; Other:13605]   Intake/Output this shift:  Total I/O In: 60454 [P.O.:240; Other:10000] Out: 3387 [Urine:250; Other:3137]  Physical Exam: General: NAD, obese  Head: Normocephalic, atraumatic. 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, R BKA  Neurologic: Nonfocal, moving all four extremities  Skin: No lesions  Access: PD catheter in place    Basic Metabolic Panel:  Recent Labs Lab 05/12/15 1300 05/13/15 0355 05/14/15 0547 05/15/15 0513  NA 138 137 140 139  K 5.9* 5.0 4.7 4.6  CL 112* 110 109 108  CO2 20* 20* 22 24  GLUCOSE 204* 289* 181* 120*  BUN 67* 60* 64* 64*  CREATININE 6.65* 6.64* 7.10* 7.86*  CALCIUM 7.9* 7.6* 7.8* 7.8*  MG  --   --  2.4  --   PHOS  --   --  8.0*  8.0* 8.3*    Liver Function Tests:  Recent Labs Lab 05/14/15 0547 05/15/15 0513  ALBUMIN 2.1* 2.0*   No results for input(s): LIPASE, AMYLASE in the last 168 hours. No results for input(s): AMMONIA in the  last 168 hours.  CBC:  Recent Labs Lab 05/12/15 1300 05/13/15 0355  WBC 8.3 6.4  HGB 9.2* 8.6*  HCT 27.8* 26.6*  MCV 84.3 85.7  PLT 326 281    Cardiac Enzymes:  Recent Labs Lab 05/12/15 1300 05/12/15 1820 05/12/15 2131 05/13/15 0355  TROPONINI <0.03 <0.03 0.04* <0.03    BNP: Invalid input(s): POCBNP  CBG:  Recent Labs Lab 05/14/15 1107 05/14/15 1644 05/14/15 2049 05/15/15 0415 05/15/15 0740  GLUCAP 243* 174* 240* 129* 110*    Microbiology: Results for orders placed or performed during the hospital encounter of 05/12/15  MRSA PCR Screening     Status: None   Collection Time: 05/14/15  8:13 PM  Result Value Ref Range Status   MRSA by PCR NEGATIVE NEGATIVE Final    Comment:        The GeneXpert MRSA Assay (FDA approved for NASAL specimens only), is one component of a comprehensive MRSA colonization surveillance program. It is not intended to diagnose MRSA infection nor to guide or monitor treatment for MRSA infections.     Coagulation Studies: No results for input(s): LABPROT, INR in the last 72 hours.  Urinalysis: No results for input(s): COLORURINE, LABSPEC, PHURINE, GLUCOSEU, HGBUR, BILIRUBINUR, KETONESUR, PROTEINUR, UROBILINOGEN, NITRITE, LEUKOCYTESUR in the last 72 hours.  Invalid input(s): APPERANCEUR    Imaging: No results found.  Medications:   . furosemide (LASIX) infusion 8 mg/hr (05/14/15 1900)   . amLODipine  10 mg Oral Daily  . aspirin EC  81 mg Oral Daily  . atorvastatin  20 mg Oral q1800  . buPROPion  150 mg Oral Daily  . calcium acetate (Phos Binder)  2,001 mg Oral TID WC  . carvedilol  6.25 mg Oral BID WC  . clopidogrel  75 mg Oral Daily  . dialysis solution 4.25% low-MG/low-CA   Intraperitoneal Q24H  . epoetin (EPOGEN/PROCRIT) injection  20,000 Units Subcutaneous Weekly  . erythromycin  250 mg Oral QID  . ezetimibe  10 mg Oral Daily  . gabapentin  300 mg Oral TID  . gentamicin cream  1 application Topical Daily   . guaiFENesin  600 mg Oral BID  . heparin  5,000 Units Subcutaneous 3 times per day  . insulin aspart  0-9 Units Subcutaneous TID WC  . insulin glargine  40 Units Subcutaneous QHS  . isosorbide mononitrate  30 mg Oral Daily  . [START ON 05/18/2015] levofloxacin  250 mg Oral Q48H  . [START ON 05/16/2015] levofloxacin  500 mg Oral Once  . lisinopril  20 mg Oral QPM  . nicotine  21 mg Transdermal Daily  . pantoprazole  40 mg Oral Daily  . sodium chloride flush  3 mL Intravenous Q12H   sodium chloride, acetaminophen **OR** acetaminophen, acetaminophen, albuterol, aspirin-acetaminophen-caffeine, cyclobenzaprine, docusate sodium, nitroGLYCERIN, ondansetron **OR** ondansetron (ZOFRAN) IV, oxyCODONE-acetaminophen, polyethylene glycol, sodium chloride flush  Assessment/ Plan:  50 y.o. female with past medical history of end-stage renal disease on peritoneal dialysis, asthma, gastroparesis, diabetes mellitus, congestive heart failure, hypertension, endometriosis  [facility]/[address]/peritoneal dialysis EDW 109kg.  1. End-stage renal disease on peritoneal dialysis.  Estimated dry weight 109 kg. Currently up to 118 kg. Volume overload.  -  Continue peritoneal dialysis. 4.25% last two nights. Will go to 2.5% tonight.  - Discontinue furosemide gtt. Start back on home torsemide dose. 100mg  daily.  - Low salt diet - fluid restriction - echocardiogram pending.   2. Anemia of chronic kidney disease: hemoglobin 8.6 - epo 20000 weekly  3. Secondary hyperparathyroidism. Phosphorus very high at 8.3  - calcium acetate with meals.   4. Hypertension: blood pressure at goal - amlodipine, carvedilol, imdur and lisinopril.    LOS: 3 Ernestina Joe 2/13/201711:02 AM

## 2015-05-16 LAB — CBC
HCT: 26.1 % — ABNORMAL LOW (ref 35.0–47.0)
HEMOGLOBIN: 8.8 g/dL — AB (ref 12.0–16.0)
MCH: 28 pg (ref 26.0–34.0)
MCHC: 33.5 g/dL (ref 32.0–36.0)
MCV: 83.5 fL (ref 80.0–100.0)
PLATELETS: 313 10*3/uL (ref 150–440)
RBC: 3.13 MIL/uL — AB (ref 3.80–5.20)
RDW: 14.2 % (ref 11.5–14.5)
WBC: 7.6 10*3/uL (ref 3.6–11.0)

## 2015-05-16 LAB — GLUCOSE, CAPILLARY: GLUCOSE-CAPILLARY: 146 mg/dL — AB (ref 65–99)

## 2015-05-16 MED ORDER — SENNOSIDES-DOCUSATE SODIUM 8.6-50 MG PO TABS
2.0000 | ORAL_TABLET | Freq: Once | ORAL | Status: AC
  Administered 2015-05-16: 2 via ORAL
  Filled 2015-05-16: qty 2

## 2015-05-16 MED ORDER — LACTULOSE 10 GM/15ML PO SOLN
30.0000 g | Freq: Once | ORAL | Status: AC
  Administered 2015-05-16: 30 g via ORAL
  Filled 2015-05-16: qty 60

## 2015-05-16 NOTE — Progress Notes (Signed)
[facility]  ROUNDING NOTE   Subjective:   PD overnight. Tolerated treatment well.  UF 1800  Objective:  Vital signs in last 24 hours:  Temp:  [97.6 F (36.4 C)-98.4 F (36.9 C)] 98.3 F (36.8 C) (02/14 0857) Pulse Rate:  [66-79] 71 (02/14 0857) Resp:  [16-18] 18 (02/14 0857) BP: (121-139)/(47-68) 139/47 mmHg (02/14 0857) SpO2:  [93 %-96 %] 96 % (02/14 0857) Weight:  [116.801 kg (257 lb 8 oz)] 116.801 kg (257 lb 8 oz) (02/14 0542)  Weight change: -1.315 kg (-2 lb 14.4 oz) Filed Weights   05/14/15 0655 05/15/15 0425 05/16/15 0542  Weight: 124.966 kg (275 lb 8 oz) 118.117 kg (260 lb 6.4 oz) 116.801 kg (257 lb 8 oz)    Intake/Output: I/O last 3 completed shifts: In: 10815.5 [P.O.:720; I.V.:95.5; Other:10000] Out: 4837 [Urine:1700; Other:3137]   Intake/Output this shift:  Total I/O In: 360 [P.O.:360] Out: 2185 [Urine:350; Other:1835]  Physical Exam: General: NAD, obese  Head: Normocephalic, atraumatic. 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, R BKA  Neurologic: Nonfocal, moving all four extremities  Skin: No lesions  Access: PD catheter in place    Basic Metabolic Panel:  Recent Labs Lab 05/12/15 1300 05/13/15 0355 05/14/15 0547 05/15/15 0513  NA 138 137 140 139  K 5.9* 5.0 4.7 4.6  CL 112* 110 109 108  CO2 20* 20* 22 24  GLUCOSE 204* 289* 181* 120*  BUN 67* 60* 64* 64*  CREATININE 6.65* 6.64* 7.10* 7.86*  CALCIUM 7.9* 7.6* 7.8* 7.8*  MG  --   --  2.4  --   PHOS  --   --  8.0*  8.0* 8.3*    Liver Function Tests:  Recent Labs Lab 05/14/15 0547 05/15/15 0513  ALBUMIN 2.1* 2.0*   No results for input(s): LIPASE, AMYLASE in the last 168 hours. No results for input(s): AMMONIA in the last 168 hours.  CBC:  Recent Labs Lab  05/12/15 1300 05/13/15 0355 05/16/15 0509  WBC 8.3 6.4 7.6  HGB 9.2* 8.6* 8.8*  HCT 27.8* 26.6* 26.1*  MCV 84.3 85.7 83.5  PLT 326 281 313    Cardiac Enzymes:  Recent Labs Lab 05/12/15 1300 05/12/15 1820 05/12/15 2131 05/13/15 0355  TROPONINI <0.03 <0.03 0.04* <0.03    BNP: Invalid input(s): POCBNP  CBG:  Recent Labs Lab 05/15/15 0415 05/15/15 0740 05/15/15 1109 05/15/15 1634 05/15/15 2004  GLUCAP 129* 110* 138* 111* 172*    Microbiology: Results for orders placed or performed during the hospital encounter of 05/12/15  MRSA PCR Screening     Status: None   Collection Time: 05/14/15  8:13 PM  Result Value Ref Range Status   MRSA by PCR NEGATIVE NEGATIVE Final    Comment:        The GeneXpert MRSA Assay (FDA approved for NASAL specimens only), is one component of a comprehensive MRSA colonization surveillance program. It is not intended to diagnose MRSA infection nor to guide or monitor treatment for MRSA infections.     Coagulation Studies: No results for input(s): LABPROT, INR in the last 72 hours.  Urinalysis: No results for input(s): COLORURINE, LABSPEC, PHURINE, GLUCOSEU, HGBUR, BILIRUBINUR, KETONESUR, PROTEINUR, UROBILINOGEN, NITRITE, LEUKOCYTESUR in the last 72 hours.  Invalid input(s): APPERANCEUR    Imaging: No results found.  Medications:     . amLODipine  10 mg Oral Daily  . aspirin EC  81 mg Oral Daily  . atorvastatin  20 mg Oral q1800  . buPROPion  150 mg Oral Daily  . calcium acetate (Phos Binder)  2,668 mg Oral TID WC  . carvedilol  6.25 mg Oral BID WC  . clopidogrel  75 mg Oral Daily  . dialysis solution 2.5% low-MG/low-CA   Intraperitoneal Q24H  . epoetin (EPOGEN/PROCRIT) injection  20,000 Units Subcutaneous Weekly  . erythromycin  250 mg Oral QID  . ezetimibe  10 mg Oral Daily  . gabapentin  300 mg Oral TID  . gentamicin cream  1 application Topical Daily  . guaiFENesin  600 mg Oral BID  . heparin  5,000 Units  Subcutaneous 3 times per day  . insulin aspart  0-9 Units Subcutaneous TID WC  . insulin glargine  40 Units Subcutaneous QHS  . isosorbide mononitrate  30 mg Oral Daily  . [START ON 05/18/2015] levofloxacin  250 mg Oral Q48H  . lisinopril  20 mg Oral QPM  . nicotine  21 mg Transdermal Daily  . pantoprazole  40 mg Oral Daily  . sodium chloride flush  3 mL Intravenous Q12H  . torsemide  100 mg Oral Daily   sodium chloride, acetaminophen **OR** acetaminophen, acetaminophen, albuterol, aspirin-acetaminophen-caffeine, cyclobenzaprine, docusate sodium, nitroGLYCERIN, ondansetron **OR** ondansetron (ZOFRAN) IV, oxyCODONE-acetaminophen, polyethylene glycol, sodium chloride flush  Assessment/ Plan:  50 y.o. female with past medical history of end-stage renal disease on peritoneal dialysis, asthma, gastroparesis, diabetes mellitus, congestive heart failure, hypertension, endometriosis  [facility]/[address]/peritoneal dialysis EDW 109kg.  1. End-stage renal disease on peritoneal dialysis.  Estimated dry weight 109 kg. Currently up to 116 kg. Volume overload.  -  Continue peritoneal dialysis.  2.5% solution.  - Continue torsemide 100mg  daily - Low salt diet - fluid restriction - echocardiogram with diastolic dysfunction.   2. Anemia of chronic kidney disease: hemoglobin 8.8 - epo 20000 weekly  3. Secondary hyperparathyroidism. Phosphorus very high at 8.3  - calcium acetate with meals.   4. Hypertension: blood pressure at goal - amlodipine, carvedilol, imdur and lisinopril.    LOS: 4 Nelani Schmelzle 2/14/201711:01 AM

## 2015-05-16 NOTE — Care Management Note (Signed)
Patient is a active PD patient t [facility] [address]  Clinic has been notified of new admission and additional records will be sent at discharge.  Ivor Reining  Dialysis Coordinator [phone]

## 2015-05-16 NOTE — Discharge Instructions (Signed)
°  DIET:  Renal diet, carbohydrate consistent diet  DISCHARGE CONDITION:  Stable  ACTIVITY:  Activity as tolerated  OXYGEN:  Home Oxygen: No.   Oxygen Delivery: room air  DISCHARGE LOCATION:  home    ADDITIONAL DISCHARGE INSTRUCTION:   If you experience worsening of your admission symptoms, develop shortness of breath, life threatening emergency, suicidal or homicidal thoughts you must seek medical attention immediately by calling 911 or calling your MD immediately  if symptoms less severe.  You Must read complete instructions/literature along with all the possible adverse reactions/side effects for all the Medicines you take and that have been prescribed to you. Take any new Medicines after you have completely understood and accpet all the possible adverse reactions/side effects.   Please note  You were cared for by a hospitalist during your hospital stay. If you have any questions about your discharge medications or the care you received while you were in the hospital after you are discharged, you can call the unit and asked to speak with the hospitalist on call if the hospitalist that took care of you is not available. Once you are discharged, your primary care physician will handle any further medical issues. Please note that NO REFILLS for any discharge medications will be authorized once you are discharged, as it is imperative that you return to your primary care physician (or establish a relationship with a primary care physician if you do not have one) for your aftercare needs so that they can reassess your need for medications and monitor your lab values.

## 2015-05-16 NOTE — Progress Notes (Signed)
Pt is a&o, VSS, NSR on tele with no complaints of pain or discomfort. Orders to d/c pt to home. Discharge instructions given to pt with verbal acknowledgment of understanding no new prescriptions. IV and tele removed and pt awaiting ride to d/c.

## 2015-05-16 NOTE — Progress Notes (Signed)
SUBJECTIVE: Pt just being disconnected from peritoneal dialysis. She had one episode of sharp chest pain very localized to the left chest, lasting about 2 minutes this morning and resolving spontaneously. SHe had no radiation to arm and no associated symptoms. Likely not cardiac in nature.   Filed Vitals:   05/15/15 1110 05/15/15 1708 05/15/15 2001 05/16/15 0542  BP: 137/68 138/59 121/57 134/49  Pulse: 79 69 67 66  Temp: 97.9 F (36.6 C)  97.6 F (36.4 C) 98.4 F (36.9 C)  TempSrc:   Oral Oral  Resp: 18  16 16   Height:      Weight:    257 lb 8 oz (116.801 kg)  SpO2: 96%  94% 93%    Intake/Output Summary (Last 24 hours) at 05/16/15 0848 Last data filed at 05/16/15 0543  Gross per 24 hour  Intake    720 ml  Output   1250 ml  Net   -530 ml    LABS: Basic Metabolic Panel:  Recent Labs  51/02/58 0547 05/15/15 0513  NA 140 139  K 4.7 4.6  CL 109 108  CO2 22 24  GLUCOSE 181* 120*  BUN 64* 64*  CREATININE 7.10* 7.86*  CALCIUM 7.8* 7.8*  MG 2.4  --   PHOS 8.0*  8.0* 8.3*   Liver Function Tests:  Recent Labs  05/14/15 0547 05/15/15 0513  ALBUMIN 2.1* 2.0*   No results for input(s): LIPASE, AMYLASE in the last 72 hours. CBC:  Recent Labs  05/16/15 0509  WBC 7.6  HGB 8.8*  HCT 26.1*  MCV 83.5  PLT 313   Cardiac Enzymes: No results for input(s): CKTOTAL, CKMB, CKMBINDEX, TROPONINI in the last 72 hours. BNP: Invalid input(s): POCBNP D-Dimer: No results for input(s): DDIMER in the last 72 hours. Hemoglobin A1C: No results for input(s): HGBA1C in the last 72 hours. Fasting Lipid Panel: No results for input(s): CHOL, HDL, LDLCALC, TRIG, CHOLHDL, LDLDIRECT in the last 72 hours. Thyroid Function Tests: No results for input(s): TSH, T4TOTAL, T3FREE, THYROIDAB in the last 72 hours.  Invalid input(s): FREET3 Anemia Panel: No results for input(s): VITAMINB12, FOLATE, FERRITIN, TIBC, IRON, RETICCTPCT in the last 72 hours.   PHYSICAL EXAM General: Well  developed, well nourished, in no acute distress HEENT:  Normocephalic and atramatic Neck:  No JVD.  Lungs: Clear bilaterally to auscultation and percussion. Heart: HRRR . Normal S1 and S2 without gallops or murmurs.  Abdomen: Bowel sounds are positive, abdomen soft and non-tender, PD cath present. Msk:  Back normal, normal gait. Normal strength and tone for age. Extremities: No clubbing, cyanosis or edema.   Neuro: Alert and oriented X 3. Psych:  Good affect, responds appropriately  TELEMETRY: Sinus rhythm 73 bpm  ASSESSMENT AND PLAN: No significant chest pain. MI ruled out. Pt to follow up at [facility] for outpatient workup.  Active Problems:   Chest pain    Berton Bon, NP-C 05/16/2015 8:48 AM

## 2015-05-16 NOTE — Discharge Summary (Signed)
Kimberly Montgomery, 50 y.o., DOB 1965/09/05, MRN [id number]. Admission date: 05/12/2015 Discharge Date 05/16/2015 Primary MD No primary care provider on file. 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 female with a known history of ESRD on PD, htn, congestive heart failure, diabetes type 2, gastroparesis, asthma and history of coronary artery disease with 3 stents in the past who presents to the ED with complaint of having shortness of breath ongoing for 1 month but progressively worse over the past 3 days. Patient was admitted for acute diastolic CHF. Was started on IV Lasix subsequently placed on Lasix drip. She also has end-stage renal disease and was seen by nephrology and her paratonia O dialysis was adjusted. Patient's feels much better her fluid overload is resolved. She also had chest pain on presentation. Was seen by cardiology who recommended outpatient follow-up. Her troponin remained negative EKG was nonrevealing. Currently she is feeling much better and is stable for discharge.             Consults  cardiology and nephrology  Significant Tests:  See full reports for all details    Dg Chest 2 View  05/12/2015  CLINICAL DATA:  End-stage renal disease on peritoneal dialysis. Shortness of breath for 3 days. EXAM: CHEST  2 VIEW COMPARISON:  12/31/2013 FINDINGS: Cardiomediastinal silhouette is normal. Mediastinal contours appear intact. There is no evidence of focal  airspace consolidation, pleural effusion or pneumothorax. There is mild pulmonary vascular congestion. Osseous structures are without acute abnormality. Soft tissues are grossly normal. IMPRESSION: Mild pulmonary vascular congestion. Electronically Signed   By: Ted Mcalpine M.D.   On: 05/12/2015 13:31       Today   Subjective:   Kimberly Montgomery  feels better denies any chest pain or shortness of breath  Objective:   Blood pressure 114/63, pulse 72, temperature 97.6 F (36.4 C), temperature source Oral, resp. rate 18, height  (1.753 m), weight 116.801 kg (257 lb 8 oz), last menstrual period 03/16/2015, SpO2 99 %.  .  Intake/Output Summary (Last 24 hours) at 05/16/15 1347 Last data filed at 05/16/15 1318  Gross per 24 hour  Intake   1080 ml  Output   3185 ml  Net  -2105 ml    Exam VITAL SIGNS: Blood pressure 114/63, pulse 72, temperature 97.6 F (36.4 C), temperature source Oral, resp. rate 18, height  (1.753 m), weight 116.801 kg (257 lb 8 oz), last menstrual period 03/16/2015, SpO2 99 %.  GENERAL:  50 y.o.-year-old patient lying in the bed with no acute distress.  EYES: Pupils equal, round, reactive to light and accommodation. No scleral icterus. Extraocular muscles intact.  HEENT: Head atraumatic, normocephalic. Oropharynx and nasopharynx clear.  NECK:  Supple, no jugular venous distention. No thyroid enlargement, no tenderness.  LUNGS: Normal breath sounds bilaterally, no wheezing, rales,rhonchi or crepitation. No use of accessory muscles of respiration.  CARDIOVASCULAR: S1, S2 normal. No murmurs, rubs, or gallops.  ABDOMEN: Soft, nontender, nondistended. Bowel sounds present. No organomegaly or mass.  EXTREMITIES: No pedal edema, cyanosis, or clubbing.  NEUROLOGIC:  Cranial nerves II through XII are intact. Muscle strength 5/5 in all extremities. Sensation intact. Gait not checked.  PSYCHIATRIC: The patient is alert and oriented x 3.  SKIN: No obvious rash,  lesion, or ulcer.   Data Review     CBC w Diff: Lab Results  Component Value Date   WBC 7.6 05/16/2015   WBC 10.1 07/17/2014   HGB 8.8* 05/16/2015   HGB 11.5* 07/17/2014   HCT 26.1* 05/16/2015   HCT 35.2 07/17/2014   PLT 313 05/16/2015   PLT 316 07/17/2014   LYMPHOPCT 29.3 07/17/2014   MONOPCT 5.5 07/17/2014   EOSPCT 1.2 07/17/2014   BASOPCT 0.7 07/17/2014   CMP: Lab Results  Component Value Date   NA 139 05/15/2015   NA 136 07/17/2014   K 4.6 05/15/2015   K 3.5 07/17/2014   CL 108 05/15/2015   CL 103 07/17/2014   CO2 24 05/15/2015   CO2 26 07/17/2014   BUN 64* 05/15/2015   BUN 32* 07/17/2014   CREATININE 7.86* 05/15/2015   CREATININE 3.45* 07/17/2014   PROT 5.7* 07/16/2014   ALBUMIN 2.0* 05/15/2015   ALBUMIN 2.6* 07/16/2014   BILITOT 0.4 07/16/2014   ALKPHOS 104 07/16/2014   AST 14* 07/16/2014   ALT 9* 07/16/2014  .  Micro Results Recent Results (from the past 240 hour(s))  MRSA PCR Screening     Status: None   Collection Time: 05/14/15  8:13 PM  Result Value Ref Range Status   MRSA by PCR NEGATIVE NEGATIVE Final    Comment:        The GeneXpert MRSA Assay (FDA approved for NASAL specimens only), is one component of a comprehensive MRSA colonization surveillance program. It is not intended to diagnose MRSA infection nor to guide or monitor treatment for MRSA infections.         Code Status Orders        Start     Ordered   05/12/15 1546  Full code   Continuous     05/12/15 1546    Code Status History    Date Active Date Inactive Code Status Order ID Comments User Context   This patient has a current code status but no historical code status.          Follow-up Information    Follow up with Laurier Nancy, MD.   Specialty:  Cardiology   Why:  Tuesday, February 28th at 10am, ccs   Contact information:   [address] [place] [address] [phone]       Follow up with pcp In 7 days.   Why:  Please talk to Dr Welton Flakes about  referring a primary physician, ccs      Discharge Medications     Medication List    TAKE these medications        acetaminophen 500 MG tablet  Commonly known as:  TYLENOL  Take 1,000 mg by mouth every 6 (six) hours as needed for mild pain.     albuterol 108 (90 Base) MCG/ACT inhaler  Commonly known as:  PROVENTIL HFA;VENTOLIN HFA  Inhale 2 puffs into the lungs every 6 (six) hours as needed for wheezing or shortness of breath.     amLODipine 10 MG tablet  Commonly known as:  NORVASC  Take 10 mg by mouth daily.     aspirin EC 81 MG tablet  Take 81 mg by mouth daily.     aspirin-acetaminophen-caffeine 250-250-65 MG tablet  Commonly known as:  EXCEDRIN  MIGRAINE  Take 2 tablets by mouth every 6 (six) hours as needed for headache.     buPROPion 150 MG 12 hr tablet  Commonly known as:  WELLBUTRIN SR  Take 150 mg by mouth daily.     calcium acetate (Phos Binder) 667 MG/5ML Soln  Commonly known as:  PHOSLYRA  Take 1,334 mg by mouth 3 (three) times daily with meals.     carvedilol 6.25 MG tablet  Commonly known as:  COREG  Take 6.25 mg by mouth 2 (two) times daily with a meal.     clopidogrel 75 MG tablet  Commonly known as:  PLAVIX  Take 75 mg by mouth daily.     docusate sodium 100 MG capsule  Commonly known as:  COLACE  Take 100 mg by mouth at bedtime as needed for mild constipation.     erythromycin 250 MG EC tablet  Commonly known as:  ERY-TAB  Take 250 mg by mouth 4 (four) times daily.     ezetimibe-simvastatin 10-40 MG tablet  Commonly known as:  VYTORIN  Take 1 tablet by mouth at bedtime.     gabapentin 300 MG capsule  Commonly known as:  NEURONTIN  Take 300 mg by mouth 3 (three) times daily.     insulin aspart 100 UNIT/ML injection  Commonly known as:  novoLOG  Inject 15 Units into the skin 3 (three) times daily with meals.     insulin glargine 100 UNIT/ML injection  Commonly known as:  LANTUS  Inject 65 Units into the skin at bedtime.      lisinopril 20 MG tablet  Commonly known as:  PRINIVIL,ZESTRIL  Take 20 mg by mouth every evening.     nitroGLYCERIN 0.4 MG SL tablet  Commonly known as:  NITROSTAT  Place 0.4 mg under the tongue every 5 (five) minutes as needed for chest pain.     omeprazole 20 MG capsule  Commonly known as:  PRILOSEC  Take 40 mg by mouth 2 (two) times daily.     ondansetron 4 MG disintegrating tablet  Commonly known as:  ZOFRAN-ODT  Take 4 mg by mouth every 8 (eight) hours as needed for nausea or vomiting.     polyethylene glycol packet  Commonly known as:  MIRALAX / GLYCOLAX  Take 17 g by mouth daily as needed for mild constipation.     torsemide 100 MG tablet  Commonly known as:  DEMADEX  Take 100 mg by mouth daily.           Total Time in preparing paper work, data evaluation and todays exam - 35 minutes  Auburn Bilberry M.D on 05/16/2015 at 1:47 PM  [facility]   Office  [phone]

## 2015-05-17 LAB — GLUCOSE, CAPILLARY: Glucose-Capillary: 107 mg/dL — ABNORMAL HIGH (ref 65–99)

## 2015-07-01 DIAGNOSIS — R569 Unspecified convulsions: Secondary | ICD-10-CM

## 2015-07-01 HISTORY — DX: Unspecified convulsions: R56.9

## 2015-07-09 ENCOUNTER — Emergency Department: Payer: Medicare HMO

## 2015-07-09 ENCOUNTER — Observation Stay
Admission: EM | Admit: 2015-07-09 | Discharge: 2015-07-12 | Disposition: A | Discharge status: Home / Self Care | Payer: Medicare HMO | Attending: Internal Medicine | Admitting: Internal Medicine

## 2015-07-09 ENCOUNTER — Encounter: Payer: Self-pay | Admitting: Internal Medicine

## 2015-07-09 DIAGNOSIS — K3184 Gastroparesis: Secondary | ICD-10-CM | POA: Diagnosis not present

## 2015-07-09 DIAGNOSIS — R079 Chest pain, unspecified: Secondary | ICD-10-CM | POA: Diagnosis not present

## 2015-07-09 DIAGNOSIS — E1143 Type 2 diabetes mellitus with diabetic autonomic (poly)neuropathy: Secondary | ICD-10-CM | POA: Diagnosis not present

## 2015-07-09 DIAGNOSIS — Z955 Presence of coronary angioplasty implant and graft: Secondary | ICD-10-CM | POA: Diagnosis not present

## 2015-07-09 DIAGNOSIS — R000 Tachycardia, unspecified: Secondary | ICD-10-CM | POA: Insufficient documentation

## 2015-07-09 DIAGNOSIS — E875 Hyperkalemia: Secondary | ICD-10-CM | POA: Insufficient documentation

## 2015-07-09 DIAGNOSIS — Z992 Dependence on renal dialysis: Secondary | ICD-10-CM | POA: Insufficient documentation

## 2015-07-09 DIAGNOSIS — Z8249 Family history of ischemic heart disease and other diseases of the circulatory system: Secondary | ICD-10-CM | POA: Diagnosis not present

## 2015-07-09 DIAGNOSIS — E785 Hyperlipidemia, unspecified: Secondary | ICD-10-CM | POA: Insufficient documentation

## 2015-07-09 DIAGNOSIS — Z833 Family history of diabetes mellitus: Secondary | ICD-10-CM | POA: Diagnosis not present

## 2015-07-09 DIAGNOSIS — Z818 Family history of other mental and behavioral disorders: Secondary | ICD-10-CM | POA: Insufficient documentation

## 2015-07-09 DIAGNOSIS — F17200 Nicotine dependence, unspecified, uncomplicated: Secondary | ICD-10-CM | POA: Diagnosis not present

## 2015-07-09 DIAGNOSIS — I200 Unstable angina: Secondary | ICD-10-CM

## 2015-07-09 DIAGNOSIS — Z951 Presence of aortocoronary bypass graft: Secondary | ICD-10-CM | POA: Diagnosis not present

## 2015-07-09 DIAGNOSIS — Z888 Allergy status to other drugs, medicaments and biological substances status: Secondary | ICD-10-CM | POA: Diagnosis not present

## 2015-07-09 DIAGNOSIS — R0602 Shortness of breath: Secondary | ICD-10-CM | POA: Diagnosis not present

## 2015-07-09 DIAGNOSIS — Z794 Long term (current) use of insulin: Secondary | ICD-10-CM | POA: Insufficient documentation

## 2015-07-09 DIAGNOSIS — K219 Gastro-esophageal reflux disease without esophagitis: Secondary | ICD-10-CM | POA: Diagnosis not present

## 2015-07-09 DIAGNOSIS — E1122 Type 2 diabetes mellitus with diabetic chronic kidney disease: Secondary | ICD-10-CM | POA: Insufficient documentation

## 2015-07-09 DIAGNOSIS — E1165 Type 2 diabetes mellitus with hyperglycemia: Secondary | ICD-10-CM | POA: Insufficient documentation

## 2015-07-09 DIAGNOSIS — R778 Other specified abnormalities of plasma proteins: Secondary | ICD-10-CM | POA: Insufficient documentation

## 2015-07-09 DIAGNOSIS — Z7951 Long term (current) use of inhaled steroids: Secondary | ICD-10-CM | POA: Insufficient documentation

## 2015-07-09 DIAGNOSIS — I509 Heart failure, unspecified: Secondary | ICD-10-CM | POA: Diagnosis not present

## 2015-07-09 DIAGNOSIS — N2581 Secondary hyperparathyroidism of renal origin: Secondary | ICD-10-CM | POA: Diagnosis not present

## 2015-07-09 DIAGNOSIS — Z7982 Long term (current) use of aspirin: Secondary | ICD-10-CM | POA: Insufficient documentation

## 2015-07-09 DIAGNOSIS — J209 Acute bronchitis, unspecified: Secondary | ICD-10-CM | POA: Insufficient documentation

## 2015-07-09 DIAGNOSIS — I25110 Atherosclerotic heart disease of native coronary artery with unstable angina pectoris: Principal | ICD-10-CM | POA: Insufficient documentation

## 2015-07-09 DIAGNOSIS — Z79899 Other long term (current) drug therapy: Secondary | ICD-10-CM | POA: Insufficient documentation

## 2015-07-09 DIAGNOSIS — E119 Type 2 diabetes mellitus without complications: Secondary | ICD-10-CM

## 2015-07-09 DIAGNOSIS — I2510 Atherosclerotic heart disease of native coronary artery without angina pectoris: Secondary | ICD-10-CM | POA: Diagnosis present

## 2015-07-09 DIAGNOSIS — Z880 Allergy status to penicillin: Secondary | ICD-10-CM | POA: Diagnosis not present

## 2015-07-09 DIAGNOSIS — I132 Hypertensive heart and chronic kidney disease with heart failure and with stage 5 chronic kidney disease, or end stage renal disease: Secondary | ICD-10-CM | POA: Insufficient documentation

## 2015-07-09 DIAGNOSIS — Z881 Allergy status to other antibiotic agents status: Secondary | ICD-10-CM | POA: Insufficient documentation

## 2015-07-09 DIAGNOSIS — R112 Nausea with vomiting, unspecified: Secondary | ICD-10-CM | POA: Diagnosis not present

## 2015-07-09 DIAGNOSIS — N186 End stage renal disease: Secondary | ICD-10-CM | POA: Insufficient documentation

## 2015-07-09 DIAGNOSIS — I10 Essential (primary) hypertension: Secondary | ICD-10-CM | POA: Diagnosis present

## 2015-07-09 HISTORY — DX: Atherosclerotic heart disease of native coronary artery without angina pectoris: I25.10

## 2015-07-09 HISTORY — DX: Gastro-esophageal reflux disease without esophagitis: K21.9

## 2015-07-09 HISTORY — DX: Hyperlipidemia, unspecified: E78.5

## 2015-07-09 LAB — BASIC METABOLIC PANEL
ANION GAP: 9 (ref 5–15)
BUN: 67 mg/dL — ABNORMAL HIGH (ref 6–20)
CO2: 18 mmol/L — ABNORMAL LOW (ref 22–32)
Calcium: 7.2 mg/dL — ABNORMAL LOW (ref 8.9–10.3)
Chloride: 107 mmol/L (ref 101–111)
Creatinine, Ser: 8.37 mg/dL — ABNORMAL HIGH (ref 0.44–1.00)
GFR calc Af Amer: 6 mL/min — ABNORMAL LOW (ref >60)
GFR calc non Af Amer: 5 mL/min — ABNORMAL LOW (ref >60)
GLUCOSE: 525 mg/dL — AB (ref 65–99)
POTASSIUM: 5.2 mmol/L — AB (ref 3.5–5.1)
SODIUM: 134 mmol/L — AB (ref 135–145)

## 2015-07-09 LAB — CBC
HEMATOCRIT: 30.7 % — AB (ref 35.0–47.0)
HEMOGLOBIN: 10.1 g/dL — AB (ref 12.0–16.0)
MCH: 28 pg (ref 26.0–34.0)
MCHC: 32.8 g/dL (ref 32.0–36.0)
MCV: 85.5 fL (ref 80.0–100.0)
Platelets: 287 10*3/uL (ref 150–440)
RBC: 3.59 MIL/uL — ABNORMAL LOW (ref 3.80–5.20)
RDW: 14.2 % (ref 11.5–14.5)
WBC: 8.9 10*3/uL (ref 3.6–11.0)

## 2015-07-09 LAB — TROPONIN I: Troponin I: 0.03 ng/mL (ref <0.031)

## 2015-07-09 MED ORDER — LEVALBUTEROL HCL 0.63 MG/3ML IN NEBU
0.6300 mg | INHALATION_SOLUTION | Freq: Once | RESPIRATORY_TRACT | Status: AC
  Administered 2015-07-10: 0.63 mg via RESPIRATORY_TRACT
  Filled 2015-07-09: qty 3

## 2015-07-09 MED ORDER — MORPHINE SULFATE (PF) 4 MG/ML IV SOLN
4.0000 mg | Freq: Once | INTRAVENOUS | Status: AC
  Administered 2015-07-09: 4 mg via INTRAVENOUS
  Filled 2015-07-09: qty 1

## 2015-07-09 MED ORDER — INSULIN ASPART 100 UNIT/ML ~~LOC~~ SOLN
5.0000 [IU] | Freq: Once | SUBCUTANEOUS | Status: AC
  Administered 2015-07-09: 5 [IU] via INTRAVENOUS
  Filled 2015-07-09: qty 5

## 2015-07-09 MED ORDER — ONDANSETRON HCL 4 MG/2ML IJ SOLN
4.0000 mg | Freq: Once | INTRAMUSCULAR | Status: AC
  Administered 2015-07-09: 4 mg via INTRAVENOUS
  Filled 2015-07-09: qty 2

## 2015-07-09 NOTE — ED Notes (Signed)
Dr. Griffin Dakin notified of critical lab value - Glucose 525, acknowledged no new orders.

## 2015-07-09 NOTE — ED Notes (Signed)
Pt states that she has been having left sided chest pain intermittently since Thursday, pt took 5 nitros last  Night, pt states she took 3 nitro before coming tonight as well as asa, pt states the pain is left sided chest radiating into the left arm with some tingling, pt reports some diff breathing and states that she is end stage renal, states 2 stents placed in her heart, last being 2013. Pt reports last peritoneal dialysis was last night

## 2015-07-09 NOTE — ED Provider Notes (Signed)
[facility] Emergency Department Provider Note  ____________________________________________  Time seen: Approximately 1015 PM  I have reviewed the triage vital signs and the nursing notes.   HISTORY  Chief Complaint Chest Pain    HPI Kimberly Montgomery is a 50 y.o. female with a history of CHF as well as CAD status post stenting who is presenting to the emergency department tonight with 4 days of intermittent chest pain. She says that the pain has gotten up to 10 out of 10 at times.It feels sharp and aching. She says it has radiated to her left arm as well as to her back. She says that it is associated with shortness of breath and is worse with exertion. She said that she is seeing cardiologist with here at [facility] and at [facility]. She had a negative stress test in November but was told by her cardiologist that of her symptoms persisted that she would need to have a catheterization once again. She has been compliant with her aspirin and Plavix and has taken aspirin prior to arrival tonight. She says that her pain is 7 out of 10 at this time. Patient says that she has had similar pain in the past with cardiac chest pain. She says that she has been using nitroglycerin over the past several days and initially it was relieving her pain but it has stopped providing relief at this point.  The patient also says that she was at a party prior to this and was eating a lot of high sugar foods. Denies any abdominal pain.  Past Medical History  Diagnosis Date  . Renal disorder   . Hypertension   . CHF (congestive heart failure) (HCC)   . Diabetes mellitus without complication (HCC)   . Gastroparesis   . Asthma   . ESRD (end stage renal disease) (HCC)   . Endometriosis   . CAD (coronary artery disease)   . HLD (hyperlipidemia)   . GERD (gastroesophageal reflux disease)     Patient Active Problem List   Diagnosis Date Noted  . Unstable angina (HCC) 07/09/2015  . ESRD on  peritoneal dialysis (HCC) 07/09/2015  . Accelerated hypertension 07/09/2015  . Type 2 diabetes mellitus (HCC) 07/09/2015  . CAD (coronary artery disease) 07/09/2015  . HLD (hyperlipidemia) 07/09/2015  . GERD (gastroesophageal reflux disease) 07/09/2015  . Chest pain 05/12/2015    Past Surgical History  Procedure Laterality Date  . Hysterotomy    . Below knee leg amputation    . Pd tube inserstion    . Hd fistula surgery with reversal      Current Outpatient Rx  Name  Route  Sig  Dispense  Refill  . acetaminophen (TYLENOL) 500 MG tablet   Oral   Take 1,000 mg by mouth every 6 (six) hours as needed for mild pain.          [name] albuterol (PROVENTIL HFA;VENTOLIN HFA) 108 (90 Base) MCG/ACT inhaler   Inhalation   Inhale 2 puffs into the lungs every 6 (six) hours as needed for wheezing or shortness of breath.         [name] amLODipine (NORVASC) 10 MG tablet   Oral   Take 10 mg by mouth daily.         [name] aspirin EC 81 MG tablet   Oral   Take 81 mg by mouth daily.         [name] aspirin-acetaminophen-caffeine (EXCEDRIN MIGRAINE) 250-250-65 MG tablet   Oral   Take 2 tablets  by mouth every 6 (six) hours as needed for headache.         [name] buPROPion (WELLBUTRIN SR) 150 MG 12 hr tablet   Oral   Take 150 mg by mouth daily.         . calcium acetate, Phos Binder, (PHOSLYRA) 667 MG/5ML SOLN   Oral   Take 1,334 mg by mouth 3 (three) times daily with meals.         . carvedilol (COREG) 6.25 MG tablet   Oral   Take 6.25 mg by mouth 2 (two) times daily with a meal.         . clopidogrel (PLAVIX) 75 MG tablet   Oral   Take 75 mg by mouth daily.         [name] docusate sodium (COLACE) 100 MG capsule   Oral   Take 100 mg by mouth at bedtime as needed for mild constipation.         [name] erythromycin (ERY-TAB) 250 MG EC tablet   Oral   Take 250 mg by mouth 4 (four) times daily.         [name] ezetimibe-simvastatin (VYTORIN) 10-40 MG tablet   Oral   Take 1 tablet by mouth at  bedtime.         . gabapentin (NEURONTIN) 300 MG capsule   Oral   Take 300 mg by mouth 3 (three) times daily.         . insulin aspart (NOVOLOG) 100 UNIT/ML injection   Subcutaneous   Inject 15 Units into the skin 3 (three) times daily with meals.         . insulin glargine (LANTUS) 100 UNIT/ML injection   Subcutaneous   Inject 65 Units into the skin at bedtime.         [name] lisinopril (PRINIVIL,ZESTRIL) 20 MG tablet   Oral   Take 20 mg by mouth every evening.         . nitroGLYCERIN (NITROSTAT) 0.4 MG SL tablet   Sublingual   Place 0.4 mg under the tongue every 5 (five) minutes as needed for chest pain.         [name] omeprazole (PRILOSEC) 20 MG capsule   Oral   Take 40 mg by mouth 2 (two) times daily.         . ondansetron (ZOFRAN-ODT) 4 MG disintegrating tablet   Oral   Take 4 mg by mouth every 8 (eight) hours as needed for nausea or vomiting.         . polyethylene glycol (MIRALAX / GLYCOLAX) packet   Oral   Take 17 g by mouth daily as needed for mild constipation.         . torsemide (DEMADEX) 100 MG tablet   Oral   Take 100 mg by mouth daily.           Allergies Cephalosporins; Penicillins; and Phenergan  Family History  Problem Relation Age of Onset  . CAD    . Diabetes    . Bipolar disorder      Social History Social History  Substance Use Topics  . Smoking status: Current Every Day Smoker  . Smokeless tobacco: Not on file  . Alcohol Use: No    Review of Systems Constitutional: No fever/chills Eyes: No visual changes. ENT: No sore throat. Cardiovascular: As above. Respiratory: As above Gastrointestinal: No abdominal pain.  No nausea, no vomiting.  No diarrhea.  No constipation. Genitourinary: Negative for dysuria. Musculoskeletal: Negative for back  pain. Skin: Negative for rash. Neurological: Negative for headaches, focal weakness or numbness.  10-point ROS otherwise  negative.  ____________________________________________   PHYSICAL EXAM:  VITAL SIGNS: ED Triage Vitals  Enc Vitals Group     BP 07/09/15 2030 233/91 mmHg     Pulse Rate 07/09/15 2030 109     Resp 07/09/15 2030 20     Temp 07/09/15 2030 98.1 F (36.7 C)     Temp Source 07/09/15 2030 Oral     SpO2 07/09/15 2030 98 %     Weight 07/09/15 2030 255 lb (115.667 kg)     Height 07/09/15 2030 5\' 9"  (1.753 m)     Head Cir --      Peak Flow --      Pain Score 07/09/15 2048 8     Pain Loc --      Pain Edu? --      Excl. in GC? --     Constitutional: Alert and oriented. Well appearing and in no acute distress. Eyes: Conjunctivae are normal. PERRL. EOMI. Head: Atraumatic. Nose: No congestion/rhinnorhea. Mouth/Throat: Mucous membranes are moist.  Oropharynx non-erythematous. Neck: No stridor.   Cardiovascular: Normal rate, regular rhythm. Grossly normal heart sounds.  Good peripheral circulation. Respiratory: Normal respiratory effort.  No retractions. Lungs CTAB. Gastrointestinal: Soft and nontender. No distention. Peritoneal dialysis catheter in place without any surrounding erythema, induration or pus. Musculoskeletal: Mild left-sided lower extremity edema. Has a right lower extremity" and prosthesis. Neurologic:  Normal speech and language. No gross focal neurologic deficits are appreciated. Skin:  Skin is warm, dry and intact. No rash noted. Psychiatric: Mood and affect are normal. Speech and behavior are normal.  ____________________________________________   LABS (all labs ordered are listed, but only abnormal results are displayed)  Labs Reviewed  BASIC METABOLIC PANEL - Abnormal; Notable for the following:    Sodium 134 (*)    Potassium 5.2 (*)    CO2 18 (*)    Glucose, Bld 525 (*)    BUN 67 (*)    Creatinine, Ser 8.37 (*)    Calcium 7.2 (*)    GFR calc non Af Amer 5 (*)    GFR calc Af Amer 6 (*)    All other components within normal limits  CBC - Abnormal; Notable  for the following:    RBC 3.59 (*)    Hemoglobin 10.1 (*)    HCT 30.7 (*)    All other components within normal limits  TROPONIN I   ____________________________________________  EKG  ED ECG REPORT I, Arelia Longest, the attending physician, personally viewed and interpreted this ECG.   Date: 07/09/2015  EKG Time: 2031  Rate: 108  Rhythm: sinus tachycardia  Axis: Normal axis  Intervals: Normal  ST&T Change: No ST segment elevation or depression. Single T-wave inversion in aVL.  ____________________________________________  RADIOLOGY  No active cardiopulmonary disease. ____________________________________________   PROCEDURES   ____________________________________________   INITIAL IMPRESSION / ASSESSMENT AND PLAN / ED COURSE  Pertinent labs & imaging results that were available during my care of the patient were reviewed by me and considered in my medical decision making (see chart for details).  ----------------------------------------- 11:50 PM on 07/09/2015 -----------------------------------------  Patient says the pain is now down to 5 out of 10. She is resting without any outward signs of distress or pain. Secondary to multiple risk factors as well as a good story for unstable angina I'll be admitting her to the hospital. She is aware of  this plan and understanding. Signed out to Dr. Anne Hahn. ____________________________________________   FINAL CLINICAL IMPRESSION(S) / ED DIAGNOSES  Unstable angina.    Myrna Blazer, MD 07/09/15 [phone]

## 2015-07-09 NOTE — H&P (Signed)
[facility] - [place] at [facility]   PATIENT NAME: Kimberly Montgomery    MR#:  [id number]  DATE OF BIRTH:  04-01-1966  DATE OF ADMISSION:  07/09/2015  PRIMARY CARE PHYSICIAN: No primary care provider on file.   REQUESTING/REFERRING PHYSICIAN: Pershing Proud, MD  CHIEF COMPLAINT:   Chief Complaint  Patient presents with  . Chest Pain    HISTORY OF PRESENT ILLNESS:  Kimberly Montgomery  is a 50 y.o. female who presents with resistant chest pain. Patient states that her chest pain started 4 days ago. The pain is across her chest, radiates into her back and down her left arm. It is waxing and waning, but at its worst she rates it as a 10 out of 10. She took multiple doses of sublingual nitroglycerin at home with some improvement, but never complete resolution. Initially was worse with exertion and better with rest. She states that he finally got to the point that it woke her up from her sleep, and her sublingual nitroglycerin was not helping at all. She took full strength aspirin and came to the ED for evaluation. She has significant cardiac history, with 2 prior stents. Her cardiologist is at [facility], and she had a recent negative stress test, but he told her that if she had continued episodes of angina she may need another cardiac catheter. Here in the ED her troponin is 0.03, and EKG does not seem to show significant ischemia. Hospitalists were called for admission for further evaluation.  PAST MEDICAL HISTORY:   Past Medical History  Diagnosis Date  . Renal disorder   . Hypertension   . CHF (congestive heart failure) (HCC)   . Diabetes mellitus without complication (HCC)   . Gastroparesis   . Asthma   . ESRD (end stage renal disease) (HCC)   . Endometriosis   . CAD (coronary artery disease)   . HLD (hyperlipidemia)   . GERD (gastroesophageal reflux disease)     PAST SURGICAL HISTORY:   Past Surgical History  Procedure Laterality Date  . Hysterotomy    . Below knee leg  amputation    . Pd tube inserstion    . Hd fistula surgery with reversal      SOCIAL HISTORY:   Social History  Substance Use Topics  . Smoking status: Current Every Day Smoker  . Smokeless tobacco: Not on file  . Alcohol Use: No    FAMILY HISTORY:   Family History  Problem Relation Age of Onset  . CAD    . Diabetes    . Bipolar disorder      DRUG ALLERGIES:   Allergies  Allergen Reactions  . Cephalosporins Anaphylaxis  . Penicillins Anaphylaxis and Other (See Comments)    Has patient had a PCN reaction causing immediate rash, facial/tongue/throat swelling, SOB or lightheadedness with hypotension: Yes Has patient had a PCN reaction causing severe rash involving mucus membranes or skin necrosis: No Has patient had a PCN reaction that required hospitalization No Has patient had a PCN reaction occurring within the last 10 years: No If all of the above answers are "NO", then may proceed with Cephalosporin use.  [name] Phenergan [Promethazine Hcl] Nausea And Vomiting    MEDICATIONS AT HOME:   Prior to Admission medications   Medication Sig Start Date End Date Taking? Authorizing Provider  acetaminophen (TYLENOL) 500 MG tablet Take 1,000 mg by mouth every 6 (six) hours as needed for mild pain.     Historical Provider, MD  albuterol (PROVENTIL  HFA;VENTOLIN HFA) 108 (90 Base) MCG/ACT inhaler Inhale 2 puffs into the lungs every 6 (six) hours as needed for wheezing or shortness of breath.    Historical Provider, MD  amLODipine (NORVASC) 10 MG tablet Take 10 mg by mouth daily.    Historical Provider, MD  aspirin EC 81 MG tablet Take 81 mg by mouth daily.    Historical Provider, MD  aspirin-acetaminophen-caffeine (EXCEDRIN MIGRAINE) [phone] MG tablet Take 2 tablets by mouth every 6 (six) hours as needed for headache.    Historical Provider, MD  buPROPion (WELLBUTRIN SR) 150 MG 12 hr tablet Take 150 mg by mouth daily.    Historical Provider, MD  calcium acetate, Phos Binder,  (PHOSLYRA) 667 MG/5ML SOLN Take 1,334 mg by mouth 3 (three) times daily with meals.    Historical Provider, MD  carvedilol (COREG) 6.25 MG tablet Take 6.25 mg by mouth 2 (two) times daily with a meal.    Historical Provider, MD  clopidogrel (PLAVIX) 75 MG tablet Take 75 mg by mouth daily.    Historical Provider, MD  docusate sodium (COLACE) 100 MG capsule Take 100 mg by mouth at bedtime as needed for mild constipation.    Historical Provider, MD  erythromycin (ERY-TAB) 250 MG EC tablet Take 250 mg by mouth 4 (four) times daily.    Historical Provider, MD  ezetimibe-simvastatin (VYTORIN) 10-40 MG tablet Take 1 tablet by mouth at bedtime.    Historical Provider, MD  gabapentin (NEURONTIN) 300 MG capsule Take 300 mg by mouth 3 (three) times daily.    Historical Provider, MD  insulin aspart (NOVOLOG) 100 UNIT/ML injection Inject 15 Units into the skin 3 (three) times daily with meals.    Historical Provider, MD  insulin glargine (LANTUS) 100 UNIT/ML injection Inject 65 Units into the skin at bedtime.    Historical Provider, MD  lisinopril (PRINIVIL,ZESTRIL) 20 MG tablet Take 20 mg by mouth every evening.    Historical Provider, MD  nitroGLYCERIN (NITROSTAT) 0.4 MG SL tablet Place 0.4 mg under the tongue every 5 (five) minutes as needed for chest pain.    Historical Provider, MD  omeprazole (PRILOSEC) 20 MG capsule Take 40 mg by mouth 2 (two) times daily.    Historical Provider, MD  ondansetron (ZOFRAN-ODT) 4 MG disintegrating tablet Take 4 mg by mouth every 8 (eight) hours as needed for nausea or vomiting.    Historical Provider, MD  polyethylene glycol (MIRALAX / GLYCOLAX) packet Take 17 g by mouth daily as needed for mild constipation.    Historical Provider, MD  torsemide (DEMADEX) 100 MG tablet Take 100 mg by mouth daily.    Historical Provider, MD    REVIEW OF SYSTEMS:  Review of Systems  Constitutional: Negative for fever, chills, weight loss and malaise/fatigue.  HENT: Negative for ear pain,  hearing loss and tinnitus.   Eyes: Negative for blurred vision, double vision, pain and redness.  Respiratory: Positive for shortness of breath and wheezing. Negative for cough and hemoptysis.   Cardiovascular: Positive for chest pain. Negative for palpitations, orthopnea and leg swelling.  Gastrointestinal: Negative for nausea, vomiting, abdominal pain, diarrhea and constipation.  Genitourinary: Negative for dysuria, frequency and hematuria.  Musculoskeletal: Negative for back pain, joint pain and neck pain.  Skin:       No acne, rash, or lesions  Neurological: Negative for dizziness, tremors, focal weakness and weakness.  Endo/Heme/Allergies: Negative for polydipsia. Does not bruise/bleed easily.  Psychiatric/Behavioral: Negative for depression. The patient is not nervous/anxious and does not  have insomnia.      VITAL SIGNS:   Filed Vitals:   07/09/15 2200 07/09/15 2230 07/09/15 2300 07/09/15 2330  BP: 200/96 173/63 163/77 148/76  Pulse: 98 89 83 79  Temp:      TempSrc:      Resp: 14 21 17 24   Height:      Weight:      SpO2: 97% 96% 94% 96%   Wt Readings from Last 3 Encounters:  07/09/15 115.667 kg (255 lb)  05/16/15 116.801 kg (257 lb 8 oz)    PHYSICAL EXAMINATION:  Physical Exam  Vitals reviewed. Constitutional: She is oriented to person, place, and time. She appears well-developed and well-nourished. No distress.  HENT:  Head: Normocephalic and atraumatic.  Mouth/Throat: Oropharynx is clear and moist.  Eyes: Conjunctivae and EOM are normal. Pupils are equal, round, and reactive to light. No scleral icterus.  Neck: Normal range of motion. Neck supple. No JVD present. No thyromegaly present.  Cardiovascular: Normal rate, regular rhythm and intact distal pulses.  Exam reveals no gallop and no friction rub.   No murmur heard. Respiratory: Effort normal. No respiratory distress. She has wheezes. She has no rales.  GI: Soft. Bowel sounds are normal. She exhibits no  distension. There is no tenderness.  Musculoskeletal: Normal range of motion. She exhibits no edema.  No arthritis, no gout  Lymphadenopathy:    She has no cervical adenopathy.  Neurological: She is alert and oriented to person, place, and time. No cranial nerve deficit.  No dysarthria, no aphasia  Skin: Skin is warm and dry. No rash noted. No erythema.  Psychiatric: She has a normal mood and affect. Her behavior is normal. Judgment and thought content normal.    LABORATORY PANEL:   CBC  Recent Labs Lab 07/09/15 2036  WBC 8.9  HGB 10.1*  HCT 30.7*  PLT 287   ------------------------------------------------------------------------------------------------------------------  Chemistries   Recent Labs Lab 07/09/15 2036  NA 134*  K 5.2*  CL 107  CO2 18*  GLUCOSE 525*  BUN 67*  CREATININE 8.37*  CALCIUM 7.2*   ------------------------------------------------------------------------------------------------------------------  Cardiac Enzymes  Recent Labs Lab 07/09/15 2036  TROPONINI 0.03   ------------------------------------------------------------------------------------------------------------------  RADIOLOGY:  Dg Chest 2 View  07/09/2015  CLINICAL DATA:  Chest pain for 3 days EXAM: CHEST  2 VIEW COMPARISON:  05/12/2015 FINDINGS: The heart size and mediastinal contours are within normal limits. Both lungs are clear. The visualized skeletal structures are unremarkable. IMPRESSION: No active cardiopulmonary disease. Electronically Signed   By: Alcide Clever M.D.   On: 07/09/2015 21:45    EKG:   Orders placed or performed during the hospital encounter of 07/09/15  . EKG 12-Lead  . EKG 12-Lead  . ED EKG within 10 minutes  . ED EKG within 10 minutes    IMPRESSION AND PLAN:  Principal Problem:   Unstable angina (HCC) - first troponin barely elevated at 0.03, which is right at the cut off. We will trend her enzymes serially tonight. EKG per my read did not show  acute ischemic changes. We'll get an echocardiogram and cardiology consult. Active Problems:   Accelerated hypertension - blood pressure has improved some with pain control. We'll continue her home antihypertensives, and can also use additional when necessary antihypertensives to keep her blood pressure less than 160/90.   ESRD on peritoneal dialysis [facility]) - nephrology consult for dialysis support   Type 2 diabetes mellitus (HCC) - patient was hyperglycemic with glucose in the 500s on arrival here.  She got some insulin in the ED. We'll have her on a carb modified diet, and continue Lantus as well as moderate sliding scale insulin before meals at bedtime.   CAD (coronary artery disease) - continue home meds, trend troponins as above.   HLD (hyperlipidemia) - continue home meds   GERD (gastroesophageal reflux disease) - home dose PPI  All the records are reviewed and case discussed with ED provider. Management plans discussed with the patient and/or family.  DVT PROPHYLAXIS: SubQ heparin  GI PROPHYLAXIS: PPI  ADMISSION STATUS: Observation  CODE STATUS: Full Code Status History    Date Active Date Inactive Code Status Order ID Comments User Context   05/12/2015  3:46 PM 05/16/2015  5:35 PM Full Code [id number]  Auburn Bilberry, MD ED      TOTAL TIME TAKING CARE OF THIS PATIENT: 45 minutes.    Cedric Mcclaine FIELDING 07/09/2015, 11:53 PM  [facility]  Office  [phone]  CC: Primary care physician; No primary care provider on file.

## 2015-07-10 ENCOUNTER — Observation Stay: Admit: 2015-07-10 | Payer: Medicare HMO

## 2015-07-10 ENCOUNTER — Encounter
Admission: EM | Disposition: A | Discharge status: Home / Self Care | Payer: Self-pay | Source: Home / Self Care | Attending: Emergency Medicine

## 2015-07-10 HISTORY — PX: CARDIAC CATHETERIZATION: SHX172

## 2015-07-10 LAB — GLUCOSE, CAPILLARY
GLUCOSE-CAPILLARY: 154 mg/dL — AB (ref 65–99)
GLUCOSE-CAPILLARY: 220 mg/dL — AB (ref 65–99)
GLUCOSE-CAPILLARY: 337 mg/dL — AB (ref 65–99)
Glucose-Capillary: 146 mg/dL — ABNORMAL HIGH (ref 65–99)
Glucose-Capillary: 239 mg/dL — ABNORMAL HIGH (ref 65–99)

## 2015-07-10 LAB — TROPONIN I
TROPONIN I: 0.14 ng/mL — AB (ref <0.031)
TROPONIN I: 0.14 ng/mL — AB (ref <0.031)
Troponin I: 0.11 ng/mL — ABNORMAL HIGH (ref <0.031)

## 2015-07-10 LAB — CBC
HCT: 29 % — ABNORMAL LOW (ref 35.0–47.0)
HEMATOCRIT: 29.1 % — AB (ref 35.0–47.0)
HEMOGLOBIN: 9.7 g/dL — AB (ref 12.0–16.0)
HEMOGLOBIN: 9.5 g/dL — AB (ref 12.0–16.0)
MCH: 28.5 pg (ref 26.0–34.0)
MCH: 27.7 pg (ref 26.0–34.0)
MCHC: 33.2 g/dL (ref 32.0–36.0)
MCHC: 32.9 g/dL (ref 32.0–36.0)
MCV: 85.8 fL (ref 80.0–100.0)
MCV: 84.3 fL (ref 80.0–100.0)
PLATELETS: 290 10*3/uL (ref 150–440)
Platelets: 298 10*3/uL (ref 150–440)
RBC: 3.4 MIL/uL — AB (ref 3.80–5.20)
RBC: 3.44 MIL/uL — ABNORMAL LOW (ref 3.80–5.20)
RDW: 14.6 % — ABNORMAL HIGH (ref 11.5–14.5)
RDW: 14.7 % — AB (ref 11.5–14.5)
WBC: 9.3 10*3/uL (ref 3.6–11.0)
WBC: 9 10*3/uL (ref 3.6–11.0)

## 2015-07-10 LAB — BASIC METABOLIC PANEL
Anion gap: 8 (ref 5–15)
BUN: 69 mg/dL — AB (ref 6–20)
CALCIUM: 7.2 mg/dL — AB (ref 8.9–10.3)
CO2: 20 mmol/L — AB (ref 22–32)
CREATININE: 8.28 mg/dL — AB (ref 0.44–1.00)
Chloride: 110 mmol/L (ref 101–111)
GFR calc Af Amer: 6 mL/min — ABNORMAL LOW (ref >60)
GFR calc non Af Amer: 5 mL/min — ABNORMAL LOW (ref >60)
GLUCOSE: 136 mg/dL — AB (ref 65–99)
Potassium: 5.7 mmol/L — ABNORMAL HIGH (ref 3.5–5.1)
Sodium: 138 mmol/L (ref 135–145)

## 2015-07-10 LAB — HEMOGLOBIN A1C: HEMOGLOBIN A1C: 8.2 % — AB (ref 4.0–6.0)

## 2015-07-10 LAB — CREATININE, SERUM
CREATININE: 8.03 mg/dL — AB (ref 0.44–1.00)
GFR, EST AFRICAN AMERICAN: 6 mL/min — AB (ref >60)
GFR, EST NON AFRICAN AMERICAN: 5 mL/min — AB (ref >60)

## 2015-07-10 SURGERY — LEFT HEART CATH AND CORONARY ANGIOGRAPHY
Anesthesia: Moderate Sedation | Laterality: Right

## 2015-07-10 MED ORDER — LEVOFLOXACIN 500 MG PO TABS: 250.0000 mg | ORAL_TABLET | ORAL | Status: DC

## 2015-07-10 MED ORDER — GENTAMICIN SULFATE 0.1 % EX CREA
1.0000 "application " | TOPICAL_CREAM | Freq: Every day | CUTANEOUS | Status: DC
  Filled 2015-07-10: qty 15

## 2015-07-10 MED ORDER — ASPIRIN EC 81 MG PO TBEC
81.0000 mg | DELAYED_RELEASE_TABLET | Freq: Every day | ORAL | Status: DC
  Administered 2015-07-10 – 2015-07-12 (×3): 81 mg via ORAL
  Filled 2015-07-10 (×3): qty 1

## 2015-07-10 MED ORDER — ISOSORBIDE MONONITRATE ER 60 MG PO TB24
60.0000 mg | ORAL_TABLET | Freq: Every day | ORAL | Status: DC
  Administered 2015-07-10 – 2015-07-12 (×3): 60 mg via ORAL
  Filled 2015-07-10 (×3): qty 1

## 2015-07-10 MED ORDER — MIDAZOLAM HCL 2 MG/2ML IJ SOLN
INTRAMUSCULAR | Status: AC
Start: 2015-07-10 — End: 2015-07-10
  Filled 2015-07-10: qty 2

## 2015-07-10 MED ORDER — ACETAMINOPHEN 325 MG PO TABS: 650.0000 mg | ORAL_TABLET | Freq: Four times a day (QID) | ORAL | Status: DC | PRN

## 2015-07-10 MED ORDER — SODIUM CHLORIDE 0.9 % WEIGHT BASED INFUSION: 1.0000 mL/kg/h | INTRAVENOUS | Status: AC

## 2015-07-10 MED ORDER — FENTANYL CITRATE (PF) 100 MCG/2ML IJ SOLN
INTRAMUSCULAR | Status: AC
  Filled 2015-07-10: qty 2

## 2015-07-10 MED ORDER — TORSEMIDE 20 MG PO TABS
100.0000 mg | ORAL_TABLET | Freq: Every day | ORAL | Status: DC
  Administered 2015-07-11 – 2015-07-12 (×2): 100 mg via ORAL
  Filled 2015-07-10 (×3): qty 5

## 2015-07-10 MED ORDER — SIMVASTATIN 40 MG PO TABS: 40.0000 mg | ORAL_TABLET | Freq: Every day | ORAL | Status: DC

## 2015-07-10 MED ORDER — SODIUM CHLORIDE 0.9% FLUSH: 3.0000 mL | INTRAVENOUS | Status: DC | PRN

## 2015-07-10 MED ORDER — CLOPIDOGREL BISULFATE 75 MG PO TABS
75.0000 mg | ORAL_TABLET | Freq: Every day | ORAL | Status: DC
  Administered 2015-07-10 – 2015-07-12 (×3): 75 mg via ORAL
  Filled 2015-07-10 (×3): qty 1

## 2015-07-10 MED ORDER — ONDANSETRON HCL 4 MG PO TABS: 4.0000 mg | ORAL_TABLET | Freq: Four times a day (QID) | ORAL | Status: DC | PRN

## 2015-07-10 MED ORDER — ACETAMINOPHEN 325 MG PO TABS: 650.0000 mg | ORAL_TABLET | ORAL | Status: DC | PRN

## 2015-07-10 MED ORDER — INSULIN GLARGINE 100 UNIT/ML ~~LOC~~ SOLN
30.0000 [IU] | Freq: Every day | SUBCUTANEOUS | Status: DC
  Administered 2015-07-10 – 2015-07-11 (×2): 30 [IU] via SUBCUTANEOUS
  Filled 2015-07-10 (×3): qty 0.3

## 2015-07-10 MED ORDER — SODIUM CHLORIDE 0.9 % IV SOLN: 250.0000 mL | INTRAVENOUS | Status: DC | PRN

## 2015-07-10 MED ORDER — IPRATROPIUM-ALBUTEROL 0.5-2.5 (3) MG/3ML IN SOLN
3.0000 mL | Freq: Four times a day (QID) | RESPIRATORY_TRACT | Status: DC
  Administered 2015-07-10 – 2015-07-11 (×3): 3 mL via RESPIRATORY_TRACT
  Filled 2015-07-10 (×3): qty 3

## 2015-07-10 MED ORDER — PANTOPRAZOLE SODIUM 40 MG PO TBEC
40.0000 mg | DELAYED_RELEASE_TABLET | Freq: Every day | ORAL | Status: DC
  Administered 2015-07-10 – 2015-07-12 (×3): 40 mg via ORAL
  Filled 2015-07-10 (×3): qty 1

## 2015-07-10 MED ORDER — LISINOPRIL 20 MG PO TABS
20.0000 mg | ORAL_TABLET | Freq: Every evening | ORAL | Status: DC
  Administered 2015-07-10 – 2015-07-11 (×2): 20 mg via ORAL
  Filled 2015-07-10 (×2): qty 1

## 2015-07-10 MED ORDER — SODIUM CHLORIDE 0.9 % IV SOLN: INTRAVENOUS | Status: DC

## 2015-07-10 MED ORDER — ATORVASTATIN CALCIUM 20 MG PO TABS
80.0000 mg | ORAL_TABLET | Freq: Every day | ORAL | Status: DC
  Administered 2015-07-10 – 2015-07-11 (×2): 80 mg via ORAL
  Filled 2015-07-10 (×2): qty 4

## 2015-07-10 MED ORDER — CARVEDILOL 6.25 MG PO TABS
6.2500 mg | ORAL_TABLET | Freq: Two times a day (BID) | ORAL | Status: DC
  Administered 2015-07-10: 6.25 mg via ORAL
  Filled 2015-07-10: qty 1

## 2015-07-10 MED ORDER — EZETIMIBE 10 MG PO TABS
10.0000 mg | ORAL_TABLET | Freq: Every day | ORAL | Status: DC
  Administered 2015-07-10 – 2015-07-11 (×2): 10 mg via ORAL
  Filled 2015-07-10 (×2): qty 1

## 2015-07-10 MED ORDER — HEPARIN (PORCINE) IN NACL 2-0.9 UNIT/ML-% IJ SOLN
INTRAMUSCULAR | Status: AC
  Filled 2015-07-10: qty 1000

## 2015-07-10 MED ORDER — IOPAMIDOL (ISOVUE-300) INJECTION 61%
INTRAVENOUS | Status: DC | PRN
  Administered 2015-07-10: 155 mL via INTRA_ARTERIAL

## 2015-07-10 MED ORDER — SODIUM CHLORIDE 0.9% FLUSH
3.0000 mL | Freq: Two times a day (BID) | INTRAVENOUS | Status: DC
  Administered 2015-07-10 – 2015-07-12 (×4): 3 mL via INTRAVENOUS

## 2015-07-10 MED ORDER — SODIUM CHLORIDE 0.9% FLUSH
3.0000 mL | Freq: Two times a day (BID) | INTRAVENOUS | Status: DC
  Administered 2015-07-10: 3 mL via INTRAVENOUS

## 2015-07-10 MED ORDER — NITROGLYCERIN 0.4 MG SL SUBL: 0.4000 mg | SUBLINGUAL_TABLET | SUBLINGUAL | Status: DC | PRN

## 2015-07-10 MED ORDER — ASPIRIN 81 MG PO CHEW: 81.0000 mg | CHEWABLE_TABLET | ORAL | Status: DC

## 2015-07-10 MED ORDER — INSULIN ASPART 100 UNIT/ML ~~LOC~~ SOLN
0.0000 [IU] | Freq: Three times a day (TID) | SUBCUTANEOUS | Status: DC
  Administered 2015-07-10: 2 [IU] via SUBCUTANEOUS
  Administered 2015-07-10: 5 [IU] via SUBCUTANEOUS
  Administered 2015-07-10: 11 [IU] via SUBCUTANEOUS
  Filled 2015-07-10: qty 5
  Filled 2015-07-10: qty 2
  Filled 2015-07-10: qty 11
  Filled 2015-07-10: qty 3

## 2015-07-10 MED ORDER — ONDANSETRON HCL 4 MG/2ML IJ SOLN: 4.0000 mg | Freq: Four times a day (QID) | INTRAMUSCULAR | Status: DC | PRN

## 2015-07-10 MED ORDER — MORPHINE SULFATE (PF) 2 MG/ML IV SOLN
2.0000 mg | Freq: Once | INTRAVENOUS | Status: AC
  Administered 2015-07-10: 2 mg via INTRAVENOUS

## 2015-07-10 MED ORDER — ONDANSETRON HCL 4 MG/2ML IJ SOLN
4.0000 mg | Freq: Four times a day (QID) | INTRAMUSCULAR | Status: DC | PRN
  Administered 2015-07-10 (×3): 4 mg via INTRAVENOUS
  Filled 2015-07-10 (×3): qty 2

## 2015-07-10 MED ORDER — PROMETHAZINE HCL 25 MG PO TABS
12.5000 mg | ORAL_TABLET | Freq: Four times a day (QID) | ORAL | Status: DC | PRN
  Filled 2015-07-10: qty 1

## 2015-07-10 MED ORDER — GUAIFENESIN ER 600 MG PO TB12
600.0000 mg | ORAL_TABLET | Freq: Two times a day (BID) | ORAL | Status: DC
  Administered 2015-07-10 – 2015-07-12 (×5): 600 mg via ORAL
  Filled 2015-07-10 (×5): qty 1

## 2015-07-10 MED ORDER — ACETAMINOPHEN 650 MG RE SUPP: 650.0000 mg | Freq: Four times a day (QID) | RECTAL | Status: DC | PRN

## 2015-07-10 MED ORDER — ATORVASTATIN CALCIUM 20 MG PO TABS: 20.0000 mg | ORAL_TABLET | Freq: Every day | ORAL | Status: DC

## 2015-07-10 MED ORDER — BUPROPION HCL ER (SR) 150 MG PO TB12
150.0000 mg | ORAL_TABLET | Freq: Every day | ORAL | Status: DC
  Administered 2015-07-10 – 2015-07-12 (×3): 150 mg via ORAL
  Filled 2015-07-10 (×3): qty 1

## 2015-07-10 MED ORDER — MORPHINE SULFATE (PF) 4 MG/ML IV SOLN
4.0000 mg | INTRAVENOUS | Status: DC | PRN
  Administered 2015-07-10 – 2015-07-11 (×8): 4 mg via INTRAVENOUS
  Filled 2015-07-10 (×7): qty 1

## 2015-07-10 MED ORDER — RANOLAZINE ER 500 MG PO TB12
500.0000 mg | ORAL_TABLET | Freq: Two times a day (BID) | ORAL | Status: DC
  Administered 2015-07-10 – 2015-07-12 (×4): 500 mg via ORAL
  Filled 2015-07-10 (×4): qty 1

## 2015-07-10 MED ORDER — AMLODIPINE BESYLATE 10 MG PO TABS
10.0000 mg | ORAL_TABLET | Freq: Every day | ORAL | Status: DC
  Administered 2015-07-10 – 2015-07-12 (×3): 10 mg via ORAL
  Filled 2015-07-10 (×3): qty 1

## 2015-07-10 MED ORDER — FENTANYL CITRATE (PF) 100 MCG/2ML IJ SOLN
INTRAMUSCULAR | Status: DC | PRN
  Administered 2015-07-10: 50 ug via INTRAVENOUS

## 2015-07-10 MED ORDER — HEPARIN SODIUM (PORCINE) 5000 UNIT/ML IJ SOLN
5000.0000 [IU] | Freq: Three times a day (TID) | INTRAMUSCULAR | Status: DC
  Administered 2015-07-10 – 2015-07-12 (×6): 5000 [IU] via SUBCUTANEOUS
  Filled 2015-07-10 (×6): qty 1

## 2015-07-10 MED ORDER — SODIUM CHLORIDE 0.9% FLUSH
3.0000 mL | Freq: Two times a day (BID) | INTRAVENOUS | Status: DC
  Administered 2015-07-10 – 2015-07-12 (×5): 3 mL via INTRAVENOUS

## 2015-07-10 MED ORDER — METHYLPREDNISOLONE SODIUM SUCC 125 MG IJ SOLR
60.0000 mg | Freq: Four times a day (QID) | INTRAMUSCULAR | Status: DC
  Administered 2015-07-10 – 2015-07-12 (×8): 60 mg via INTRAVENOUS
  Filled 2015-07-10 (×8): qty 2

## 2015-07-10 MED ORDER — MORPHINE SULFATE (PF) 2 MG/ML IV SOLN
INTRAVENOUS | Status: AC
  Filled 2015-07-10: qty 1

## 2015-07-10 MED ORDER — MORPHINE SULFATE (PF) 4 MG/ML IV SOLN
INTRAVENOUS | Status: AC
  Administered 2015-07-10: 4 mg via INTRAVENOUS
  Filled 2015-07-10: qty 1

## 2015-07-10 MED ORDER — CARVEDILOL 12.5 MG PO TABS
12.5000 mg | ORAL_TABLET | Freq: Two times a day (BID) | ORAL | Status: DC
  Administered 2015-07-10 – 2015-07-12 (×4): 12.5 mg via ORAL
  Filled 2015-07-10 (×4): qty 1

## 2015-07-10 MED ORDER — LEVOFLOXACIN 500 MG PO TABS
500.0000 mg | ORAL_TABLET | Freq: Once | ORAL | Status: AC
  Administered 2015-07-10: 500 mg via ORAL
  Filled 2015-07-10: qty 1

## 2015-07-10 MED ORDER — EZETIMIBE-SIMVASTATIN 10-40 MG PO TABS: 1.0000 | ORAL_TABLET | Freq: Every day | ORAL | Status: DC

## 2015-07-10 MED ORDER — DELFLEX-LC/1.5% DEXTROSE 346 MOSM/L IP SOLN
INTRAPERITONEAL | Status: DC
  Filled 2015-07-10 (×2): qty 3000

## 2015-07-10 MED ORDER — MIDAZOLAM HCL 2 MG/2ML IJ SOLN
INTRAMUSCULAR | Status: DC | PRN
  Administered 2015-07-10 (×2): 1 mg via INTRAVENOUS

## 2015-07-10 SURGICAL SUPPLY — 9 items
CATH INFINITI 5FR ANG PIGTAIL (CATHETERS) ×3 IMPLANT
CATH INFINITI 5FR JL4 (CATHETERS) ×3 IMPLANT
CATH INFINITI JR4 5F (CATHETERS) ×3 IMPLANT
DEVICE CLOSURE MYNXGRIP 5F (Vascular Products) ×3 IMPLANT
KIT MANI 3VAL PERCEP (MISCELLANEOUS) ×3 IMPLANT
NEEDLE PERC 18GX7CM (NEEDLE) ×3 IMPLANT
PACK CARDIAC CATH (CUSTOM PROCEDURE TRAY) ×3 IMPLANT
SHEATH PINNACLE 5F 10CM (SHEATH) ×3 IMPLANT
WIRE EMERALD 3MM-J .035X150CM (WIRE) ×3 IMPLANT

## 2015-07-10 NOTE — Progress Notes (Signed)
MD notified of critical troponin 0.14; no new orders given at this time. Nursing staff will continue to monitor. Lamonte Richer, RN

## 2015-07-10 NOTE — Care Management (Signed)
Patient admitted with chest pain and elevated troponin's. Cardiology recommending cardiac cath. Met with patient at bedside. She states she lives alone. She does not drive but her daughter transports her as needed. Her PCP is at [facility]. She has a Transport planner through [employer] that proves her with community resources 1 x pe week. Denies issues obtaining medications, copays or medical care. No needs anitcipated.

## 2015-07-10 NOTE — Progress Notes (Signed)
Patient has high grade diffuse lesion in proximal LAD, but distal LAD is severe, and OM2 is severre at bifurcation. Stent in LAD patent. EF 60%. Patient has diffuse 3 vessel disease but is not ideal candidate for CABG. Will try aggressive medical treatment with imdur/ranexa and change omeprazole to protonix as it effects plavix.

## 2015-07-10 NOTE — Progress Notes (Addendum)
Patient arrived to 2A Room 235. Patient denies pain and all questions answered. Patient oriented to unit and Fall Safety Plan signed. 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 A&Ox4, VSS, and NSR on tele box #[id number]. Nursing staff will continue to monitor. Lamonte Richer, RN

## 2015-07-10 NOTE — Progress Notes (Signed)
Peritoneal dialysis started.

## 2015-07-10 NOTE — Progress Notes (Signed)
[facility] - [place] at [facility]                                                                                                                                                                                            Patient Demographics   Kimberly Montgomery, is a 50 y.o. female, DOB - 10-08-1965, XJO:[id number]  Admit date - 07/09/2015   Admitting Physician Oralia Manis, MD  Outpatient Primary MD for the patient is No primary care provider on file.   LOS -   Subjective:Patient admitted with recurrent chest pain. Has a history of 2 stents in the past. She continues to complain of persistent chest pain. Now also complains of cough and congestion. And wheezing.     Review of Systems:   CONSTITUTIONAL: No documented fever. No fatigue, weakness. No weight gain, no weight loss.  EYES: No blurry or double vision.  ENT: No tinnitus. No postnasal drip. No redness of the oropharynx.  RESPIRATORY:Positive cough, positive wheeze, no hemoptysis. Positive dyspnea.  CARDIOVASCULAR: Positive chest pain. No orthopnea. No palpitations. No syncope.  GASTROINTESTINAL: No nausea, no vomiting or diarrhea. No abdominal pain. No melena or hematochezia.  GENITOURINARY: No dysuria or hematuria.  ENDOCRINE: No polyuria or nocturia. No heat or cold intolerance.  HEMATOLOGY: No anemia. No bruising. No bleeding.  INTEGUMENTARY: No rashes. No lesions.  MUSCULOSKELETAL: No arthritis. No swelling. No gout.  NEUROLOGIC: No numbness, tingling, or ataxia. No seizure-type activity.  PSYCHIATRIC: No anxiety. No insomnia. No ADD.    Vitals:   Filed Vitals:   07/10/15 0030 07/10/15 0100 07/10/15 0204 07/10/15 0735  BP: 149/94 140/67 146/72 166/82  Pulse: 72 71 75 74  Temp:   98.1 F (36.7 C)   TempSrc:   Oral   Resp: 24 14  20   Height:      Weight:   120.974 kg (266 lb 11.2 oz)   SpO2: 97% 97% 99% 97%    Wt Readings from Last 3 Encounters:  07/10/15 120.974 kg (266 lb 11.2 oz)   05/16/15 116.801 kg (257 lb 8 oz)     Intake/Output Summary (Last 24 hours) at 07/10/15 0809 Last data filed at 07/10/15 0754  Gross per 24 hour  Intake      0 ml  Output      0 ml  Net      0 ml    Physical Exam:   GENERAL: Pleasant-appearing in no apparent distress.  HEAD, EYES, EARS, NOSE AND THROAT: Atraumatic, normocephalic. Extraocular muscles are intact. Pupils equal and reactive to light. Sclerae anicteric. No conjunctival injection. No oro-pharyngeal erythema.  NECK:  Supple. There is no jugular venous distention. No bruits, no lymphadenopathy, no thyromegaly.  HEART: Regular rate and rhythm,. No murmurs, no rubs, no clicks.  LUNGS: B/L weezing bilaterally. No rales or rhonchi.  ABDOMEN: Soft, flat, nontender, nondistended. Has good bowel sounds. No hepatosplenomegaly appreciated.  EXTREMITIES: No evidence of any cyanosis, clubbing, or peripheral edema.  +2 pedal and radial pulses bilaterally.  NEUROLOGIC: The patient is alert, awake, and oriented x3 with no focal motor or sensory deficits appreciated bilaterally.  SKIN: Moist and warm with no rashes appreciated.  Psych: Not anxious, depressed LN: No inguinal LN enlargement    Antibiotics   Anti-infectives    Start     Dose/Rate Route Frequency Ordered Stop   07/10/15 1000  levofloxacin (LEVAQUIN) tablet 500 mg     500 mg Oral Daily 07/10/15 0808        Medications   Scheduled Meds: . amLODipine  10 mg Oral Daily  . aspirin EC  81 mg Oral Daily  . atorvastatin  20 mg Oral q1800  . buPROPion  150 mg Oral Daily  . carvedilol  6.25 mg Oral BID WC  . clopidogrel  75 mg Oral Daily  . ezetimibe  10 mg Oral QHS  . guaiFENesin  600 mg Oral BID  . heparin  5,000 Units Subcutaneous 3 times per day  . insulin aspart  0-15 Units Subcutaneous TID AC & HS  . insulin glargine  30 Units Subcutaneous QHS  . ipratropium-albuterol  3 mL Nebulization Q6H  . levofloxacin  500 mg Oral Daily  . lisinopril  20 mg Oral QPM  .  methylPREDNISolone (SOLU-MEDROL) injection  60 mg Intravenous Q6H  . pantoprazole  40 mg Oral Daily  . sodium chloride flush  3 mL Intravenous Q12H  . torsemide  100 mg Oral Daily   Continuous Infusions:  PRN Meds:.acetaminophen **OR** acetaminophen, morphine injection, nitroGLYCERIN, ondansetron **OR** ondansetron (ZOFRAN) IV   Data Review:   Micro Results No results found for this or any previous visit (from the past 240 hour(s)).  Radiology Reports Dg Chest 2 View  07/09/2015  CLINICAL DATA:  Chest pain for 3 days EXAM: CHEST  2 VIEW COMPARISON:  05/12/2015 FINDINGS: The heart size and mediastinal contours are within normal limits. Both lungs are clear. The visualized skeletal structures are unremarkable. IMPRESSION: No active cardiopulmonary disease. Electronically Signed   By: Alcide Clever M.D.   On: 07/09/2015 21:45     CBC  Recent Labs Lab 07/09/15 2036 07/10/15 0238  WBC 8.9 9.3  HGB 10.1* 9.7*  HCT 30.7* 29.1*  PLT 287 298  MCV 85.5 85.8  MCH 28.0 28.5  MCHC 32.8 33.2  RDW 14.2 14.6*    Chemistries   Recent Labs Lab 07/09/15 2036 07/10/15 0238  NA 134*  --   K 5.2*  --   CL 107  --   CO2 18*  --   GLUCOSE 525*  --   BUN 67*  --   CREATININE 8.37* 8.03*  CALCIUM 7.2*  --    ------------------------------------------------------------------------------------------------------------------ estimated creatinine clearance is 11.5 mL/min (by C-G formula based on Cr of 8.03). ------------------------------------------------------------------------------------------------------------------  Recent Labs  07/10/15 0238  HGBA1C 8.2*   ------------------------------------------------------------------------------------------------------------------ No results for input(s): CHOL, HDL, LDLCALC, TRIG, CHOLHDL, LDLDIRECT in the last 72 hours. ------------------------------------------------------------------------------------------------------------------ No  results for input(s): TSH, T4TOTAL, T3FREE, THYROIDAB in the last 72 hours.  Invalid input(s): FREET3 ------------------------------------------------------------------------------------------------------------------ No results for input(s): VITAMINB12, FOLATE, FERRITIN, TIBC, IRON, RETICCTPCT in the last  72 hours.  Coagulation profile No results for input(s): INR, PROTIME in the last 168 hours.  No results for input(s): DDIMER in the last 72 hours.  Cardiac Enzymes  Recent Labs Lab 07/09/15 2036 07/10/15 0238  TROPONINI 0.03 0.14*   ------------------------------------------------------------------------------------------------------------------ Invalid input(s): POCBNP    Assessment & Plan  Patient with 50 year old with end-stage renal disease coronary artery disease presents with chest pain. 1.   Unstable angina (HCC) troponin is slightly elevated. Continue aspirin Plavix cardiology evaluation currently pending  2. Acute bronchospasm  with acute bronchitis: I will place patient on nebulizers steroids and oral antibiotics. Monitor respiratory status  3.  ESRD on peritoneal dialysis [facility]) nephrology consult  4.  Accelerated hypertension: Blood pressure is currently elevated continue amlodipine, lisinopril , I will add IV hydralazine  5.   Type 2 diabetes mellitus (HCC) under poor control continue sliding scale for now we'll adjust regimen check a hemoglobin A1c  6.  HLD (hyperlipidemia) continue Zetia and Lipitor  7. Miscellaneous heparin for DVT prophylaxis      Code Status Orders        Start     Ordered   07/10/15 0150  Full code   Continuous     07/10/15 0149    Code Status History    Date Active Date Inactive Code Status Order ID Comments User Context   05/12/2015  3:46 PM 05/16/2015  5:35 PM Full Code [id number]  Auburn Bilberry, MD ED           Consults Cardiology DVT Prophylaxis  heparin  Lab Results  Component Value Date   PLT 298 07/10/2015      Time Spent in minutes     Greater than 50% of time spent in care coordination and counseling patient regarding the condition and plan of care.   Auburn Bilberry M.D on 07/10/2015 at 8:09 AM  Between 7am to 6pm - Pager - [phone]  After 6pm go to [email or web address] - password [email or web address] [facility]  [facility]   Office  [phone]

## 2015-07-10 NOTE — Progress Notes (Signed)
Pt came back from cath and decision was to medically manage. Pt reported nausea and pain and received zofran. Up ad lib and tolerated it well. Pt has no further concerns at this time.

## 2015-07-10 NOTE — Consult Note (Signed)
Pharmacy Antibiotic Note  Kimberly Montgomery is a 50 y.o. female admitted on 07/09/2015 with acute bronchitis.  Pharmacy has been consulted for levofloxacin dosing. Patient presents with chest pain, now complaining of cough and congestion. Patient has a PMH of ESRD on peritoneal dialysis.  Plan: levofloxacin 500mg  once then 250mg  q 48 hours.  Height: 5\' 9"  (175.3 cm) Weight: 266 lb 11.2 oz (120.974 kg) IBW/kg (Calculated) : 66.2  Temp (24hrs), Avg:98.1 F (36.7 C), Min:98.1 F (36.7 C), Max:98.1 F (36.7 C)   Recent Labs Lab 07/09/15 2036 07/10/15 0238  WBC 8.9 9.3  CREATININE 8.37* 8.03*    Estimated Creatinine Clearance: 11.5 mL/min (by C-G formula based on Cr of 8.03).    Allergies  Allergen Reactions  . Cephalosporins Anaphylaxis  . Penicillins Anaphylaxis and Other (See Comments)    Has patient had a PCN reaction causing immediate rash, facial/tongue/throat swelling, SOB or lightheadedness with hypotension: Yes Has patient had a PCN reaction causing severe rash involving mucus membranes or skin necrosis: No Has patient had a PCN reaction that required hospitalization No Has patient had a PCN reaction occurring within the last 10 years: No If all of the above answers are "NO", then may proceed with Cephalosporin use.  [name] Phenergan [Promethazine Hcl] Nausea And Vomiting    Antimicrobials this admission: levofloxacin 4/10 >>    Dose adjustments this admission:   Microbiology results:   Thank you for allowing pharmacy to be a part of this patient's care.  Olene Floss, Pharm.D Clinical Pharmacist   07/10/2015 8:19 AM

## 2015-07-10 NOTE — Care Management Obs Status (Signed)
MEDICARE OBSERVATION STATUS NOTIFICATION   Patient Details  Name: Kimberly Montgomery MRN: [id number] Date of Birth: 02-29-1966   Medicare Observation Status Notification Given:  Yes    Marily Memos, RN 07/10/2015, 12:28 PM

## 2015-07-10 NOTE — Consult Note (Signed)
Kimberly Montgomery is a 50 y.o. female  [id number]  Primary Cardiologist: Adrian Blackwater Reason for Consultation: Unstable angina  HPI: Kimberly Montgomery is a 50 year old female with prior history of type 2 diabetes and right BKA, hypertension, hypercholesterol in particular triglycerides, coronary artery disease with 2 stents, renal failure on peritoneal dialysis and family history of heart disease in mother having died of an MI in her 84s and father with CAD first appearing in his 59s. The patient is dealing with bouts of chest pain over the last 6 months having had a negative stress test at [facility]. She was hospitalized in February for chest pain and ruled out for MI and followed up at [facility] cardiology for consultation. She was advised to have a CT in angiography of the coronaries but felt that she could not afford the co-pay for this test. She presents again with chest pressure that began last Wednesday and became worse last night with sharp substernal chest pains into the left posterior arm and especially the elbow. This was associated with shortness of breath and nausea vomiting. Her cardiac enzyme has trended upward. Advise catheterization   Review of Systems: Positive for substernal chest pain with intermittent sharp chest pains in the left breast area radiating to the left, shortness of breath, nausea   Past Medical History  Diagnosis Date  . Renal disorder   . Hypertension   . CHF (congestive heart failure) (HCC)   . Diabetes mellitus without complication (HCC)   . Gastroparesis   . Asthma   . ESRD (end stage renal disease) (HCC)   . Endometriosis   . CAD (coronary artery disease)   . HLD (hyperlipidemia)   . GERD (gastroesophageal reflux disease)     Medications Prior to Admission  Medication Sig Dispense Refill  . acetaminophen (TYLENOL) 500 MG tablet Take 1,000 mg by mouth every 6 (six) hours as needed for mild pain.     [name] albuterol (PROVENTIL HFA;VENTOLIN HFA) 108 (90 Base) MCG/ACT  inhaler Inhale 2 puffs into the lungs every 6 (six) hours as needed for wheezing or shortness of breath.    [name] amLODipine (NORVASC) 10 MG tablet Take 10 mg by mouth daily.    [name] aspirin EC 81 MG tablet Take 81 mg by mouth daily.    [name] aspirin-acetaminophen-caffeine (EXCEDRIN MIGRAINE) 250-250-65 MG tablet Take 2 tablets by mouth every 6 (six) hours as needed for headache.    [name] buPROPion (WELLBUTRIN SR) 150 MG 12 hr tablet Take 150 mg by mouth daily.    . calcium acetate, Phos Binder, (PHOSLYRA) 667 MG/5ML SOLN Take 2,668 mg by mouth 3 (three) times daily with meals.     . carvedilol (COREG) 6.25 MG tablet Take 6.25 mg by mouth 2 (two) times daily with a meal.    . clopidogrel (PLAVIX) 75 MG tablet Take 75 mg by mouth daily.    [name] docusate sodium (COLACE) 100 MG capsule Take 100 mg by mouth at bedtime as needed for mild constipation.    [name] ezetimibe-simvastatin (VYTORIN) 10-40 MG tablet Take 1 tablet by mouth at bedtime.    . gabapentin (NEURONTIN) 600 MG tablet Take 600 mg by mouth 3 (three) times daily.  5  . insulin aspart (NOVOLOG) 100 UNIT/ML injection Inject 15 Units into the skin 3 (three) times daily with meals.    . insulin glargine (LANTUS) 100 UNIT/ML injection Inject 65 Units into the skin at bedtime.    [name] lisinopril (PRINIVIL,ZESTRIL) 40 MG tablet Take 40 mg by  mouth daily.  3  . nitroGLYCERIN (NITROSTAT) 0.4 MG SL tablet Place 0.4 mg under the tongue every 5 (five) minutes as needed for chest pain. Reported on 07/10/2015    . omeprazole (PRILOSEC) 40 MG capsule Take 40 mg by mouth 2 (two) times daily.    . ondansetron (ZOFRAN-ODT) 4 MG disintegrating tablet Take 4 mg by mouth every 8 (eight) hours as needed for nausea or vomiting.    . polyethylene glycol (MIRALAX / GLYCOLAX) packet Take 17 g by mouth daily as needed for mild constipation.    . torsemide (DEMADEX) 100 MG tablet Take 100 mg by mouth daily.       [name] amLODipine  10 mg Oral Daily  . aspirin EC  81 mg Oral Daily  .  atorvastatin  20 mg Oral q1800  . buPROPion  150 mg Oral Daily  . carvedilol  6.25 mg Oral BID WC  . clopidogrel  75 mg Oral Daily  . ezetimibe  10 mg Oral QHS  . guaiFENesin  600 mg Oral BID  . heparin  5,000 Units Subcutaneous 3 times per day  . insulin aspart  0-15 Units Subcutaneous TID AC & HS  . insulin glargine  30 Units Subcutaneous QHS  . ipratropium-albuterol  3 mL Nebulization Q6H  . [START ON 07/12/2015] levofloxacin  250 mg Oral Q48H  . levofloxacin  500 mg Oral Once  . lisinopril  20 mg Oral QPM  . methylPREDNISolone (SOLU-MEDROL) injection  60 mg Intravenous Q6H  . pantoprazole  40 mg Oral Daily  . sodium chloride flush  3 mL Intravenous Q12H  . torsemide  100 mg Oral Daily    Infusions:    Allergies  Allergen Reactions  . Cephalosporins Anaphylaxis  . Penicillins Anaphylaxis and Other (See Comments)    Has patient had a PCN reaction causing immediate rash, facial/tongue/throat swelling, SOB or lightheadedness with hypotension: Yes Has patient had a PCN reaction causing severe rash involving mucus membranes or skin necrosis: No Has patient had a PCN reaction that required hospitalization No Has patient had a PCN reaction occurring within the last 10 years: No If all of the above answers are "NO", then may proceed with Cephalosporin use.  [name] Phenergan [Promethazine Hcl] Nausea And Vomiting    Social History   Social History  . Marital Status: Widowed    Spouse Name: N/A  . Number of Children: N/A  . Years of Education: N/A   Occupational History  . Not on file.   Social History Main Topics  . Smoking status: Current Every Day Smoker  . Smokeless tobacco: Not on file  . Alcohol Use: No  . Drug Use: No  . Sexual Activity: Not on file   Other Topics Concern  . Not on file   Social History Narrative    Family History  Problem Relation Age of Onset  . CAD    . Diabetes    . Bipolar disorder      PHYSICAL EXAM: Filed Vitals:   07/10/15 0204  07/10/15 0735  BP: 146/72 166/82  Pulse: 75 74  Temp: 98.1 F (36.7 C)   Resp:  20     Intake/Output Summary (Last 24 hours) at 07/10/15 0847 Last data filed at 07/10/15 0754  Gross per 24 hour  Intake      0 ml  Output      0 ml  Net      0 ml    General:  Well appearing. No  respiratory difficulty HEENT: normal Neck: supple. no JVD. Carotids 2+ bilat; no bruits. No lymphadenopathy or thryomegaly appreciated. Cor: PMI nondisplaced. Regular rate & rhythm. No rubs, gallops or murmurs. Lungs: clear Abdomen: soft, nontender, nondistended. No hepatosplenomegaly. No bruits or masses. Good bowel sounds. Extremities: no cyanosis, clubbing, rash, edema Neuro: alert & oriented x 3, cranial nerves grossly intact. moves all 4 extremities w/o difficulty. Affect pleasant.  ECG: Sinus tachycardia with a rate of 108 bpm and nonspecific ST-T changes  Results for orders placed or performed during the hospital encounter of 07/09/15 (from the past 24 hour(s))  Basic metabolic panel     Status: Abnormal   Collection Time: 07/09/15  8:36 PM  Result Value Ref Range   Sodium 134 (L) 135 - 145 mmol/L   Potassium 5.2 (H) 3.5 - 5.1 mmol/L   Chloride 107 101 - 111 mmol/L   CO2 18 (L) 22 - 32 mmol/L   Glucose, Bld 525 (HH) 65 - 99 mg/dL   BUN 67 (H) 6 - 20 mg/dL   Creatinine, Ser 2.53 (H) 0.44 - 1.00 mg/dL   Calcium 7.2 (L) 8.9 - 10.3 mg/dL   GFR calc non Af Amer 5 (L) >60 mL/min   GFR calc Af Amer 6 (L) >60 mL/min   Anion gap 9 5 - 15  CBC     Status: Abnormal   Collection Time: 07/09/15  8:36 PM  Result Value Ref Range   WBC 8.9 3.6 - 11.0 K/uL   RBC 3.59 (L) 3.80 - 5.20 MIL/uL   Hemoglobin 10.1 (L) 12.0 - 16.0 g/dL   HCT 66.4 (L) 40.3 - 47.4 %   MCV 85.5 80.0 - 100.0 fL   MCH 28.0 26.0 - 34.0 pg   MCHC 32.8 32.0 - 36.0 g/dL   RDW 25.9 56.3 - 87.5 %   Platelets 287 150 - 440 K/uL  Troponin I     Status: None   Collection Time: 07/09/15  8:36 PM  Result Value Ref Range   Troponin I 0.03  <0.031 ng/mL  Glucose, capillary     Status: Abnormal   Collection Time: 07/10/15 12:57 AM  Result Value Ref Range   Glucose-Capillary 220 (H) 65 - 99 mg/dL  CBC     Status: Abnormal   Collection Time: 07/10/15  2:38 AM  Result Value Ref Range   WBC 9.3 3.6 - 11.0 K/uL   RBC 3.40 (L) 3.80 - 5.20 MIL/uL   Hemoglobin 9.7 (L) 12.0 - 16.0 g/dL   HCT 64.3 (L) 32.9 - 51.8 %   MCV 85.8 80.0 - 100.0 fL   MCH 28.5 26.0 - 34.0 pg   MCHC 33.2 32.0 - 36.0 g/dL   RDW 84.1 (H) 66.0 - 63.0 %   Platelets 298 150 - 440 K/uL  Creatinine, serum     Status: Abnormal   Collection Time: 07/10/15  2:38 AM  Result Value Ref Range   Creatinine, Ser 8.03 (H) 0.44 - 1.00 mg/dL   GFR calc non Af Amer 5 (L) >60 mL/min   GFR calc Af Amer 6 (L) >60 mL/min  Troponin I     Status: Abnormal   Collection Time: 07/10/15  2:38 AM  Result Value Ref Range   Troponin I 0.14 (H) <0.031 ng/mL  Hemoglobin A1c     Status: Abnormal   Collection Time: 07/10/15  2:38 AM  Result Value Ref Range   Hgb A1c MFr Bld 8.2 (H) 4.0 - 6.0 %  Glucose, capillary  Status: Abnormal   Collection Time: 07/10/15  6:05 AM  Result Value Ref Range   Glucose-Capillary 146 (H) 65 - 99 mg/dL   Comment 1 Notify RN   CBC     Status: Abnormal   Collection Time: 07/10/15  7:51 AM  Result Value Ref Range   WBC 9.0 3.6 - 11.0 K/uL   RBC 3.44 (L) 3.80 - 5.20 MIL/uL   Hemoglobin 9.5 (L) 12.0 - 16.0 g/dL   HCT 40.9 (L) 81.1 - 91.4 %   MCV 84.3 80.0 - 100.0 fL   MCH 27.7 26.0 - 34.0 pg   MCHC 32.9 32.0 - 36.0 g/dL   RDW 78.2 (H) 95.6 - 21.3 %   Platelets 290 150 - 440 K/uL   Dg Chest 2 View  07/09/2015  CLINICAL DATA:  Chest pain for 3 days EXAM: CHEST  2 VIEW COMPARISON:  05/12/2015 FINDINGS: The heart size and mediastinal contours are within normal limits. Both lungs are clear. The visualized skeletal structures are unremarkable. IMPRESSION: No active cardiopulmonary disease. Electronically Signed   By: Alcide Clever M.D.   On: 07/09/2015  21:45     ASSESSMENT AND PLAN: Ms. Fuente is admitted with unstable angina and upward trending troponins and continued chest pain pressure radiating to left arm and accompanied by shortness of breath and nausea. The patient has had repeated episodes of chest pain/angina over the last 6 months with negative stress test. Advise cardiac catheterization using low impact contrast is related to the kidneys. Dr. Welton Flakes, who has seen this patient in February for similar complaints, will perform the catheterization.  Berton Bon, NP 07/10/2015 8:47 AM

## 2015-07-10 NOTE — Progress Notes (Signed)
[facility]  ROUNDING NOTE   Subjective:  Patient presented with chest pain. She has known underlying coronary artery disease. She was now found to have unstable angina. She had cardiac catheterization this a.m. Which showed high-grade diffuse lesion in the proximal LAD but distal LAD was severe and OM 2 was severe at the bifurcation.   Objective:  Vital signs in last 24 hours:  Temp:  [97.7 F (36.5 C)-98.2 F (36.8 C)] 98.2 F (36.8 C) (04/10 1328) Pulse Rate:  [66-109] 71 (04/10 1546) Resp:  [14-24] 15 (04/10 1445) BP: (117-233)/(46-101) 143/66 mmHg (04/10 1546) SpO2:  [12 %-99 %] 92 % (04/10 1546) Weight:  [115.667 kg (255 lb)-120.974 kg (266 lb 11.2 oz)] 120.974 kg (266 lb 11.2 oz) (04/10 0204)  Weight change:  Filed Weights   07/09/15 2030 07/10/15 0204  Weight: 115.667 kg (255 lb) 120.974 kg (266 lb 11.2 oz)    Intake/Output:     Intake/Output this shift:  Total I/O In: -  Out: 300 [Urine:300]  Physical Exam: General: NAD, resting in bed  Head: Normocephalic, atraumatic. Moist oral mucosal membranes  Eyes: Anicteric, PERRL  Neck: Supple, trachea midline  Lungs:  Clear to auscultation, normal effort  Heart: S1S2 no rubs  Abdomen:  Soft, nontender, BS present   Extremities: trace peripheral edema, R BKA  Neurologic: Nonfocal, moving all four extremities  Skin: No lesions  Access: PD catheter in place    Basic Metabolic Panel:  Recent Labs Lab 07/09/15 2036 07/10/15 0238 07/10/15 0751  NA 134*  --  138  K 5.2*  --  5.7*  CL 107  --  110  CO2 18*  --  20*  GLUCOSE 525*  --  136*  BUN 67*  --  69*  CREATININE 8.37* 8.03* 8.28*  CALCIUM 7.2*  --  7.2*    Liver Function Tests: No results for input(s): AST, ALT, ALKPHOS, BILITOT, PROT, ALBUMIN in the last 168 hours. No results for input(s): LIPASE, AMYLASE in the last 168 hours. No results for input(s): AMMONIA in the last 168 hours.  CBC:  Recent Labs Lab 07/09/15 2036  07/10/15 0238 07/10/15 0751  WBC 8.9 9.3 9.0  HGB 10.1* 9.7* 9.5*  HCT 30.7* 29.1* 29.0*  MCV 85.5 85.8 84.3  PLT 287 298 290    Cardiac Enzymes:  Recent Labs Lab 07/09/15 2036 07/10/15 0238 07/10/15 0751  TROPONINI 0.03 0.14* 0.14*    BNP: Invalid input(s): POCBNP  CBG:  Recent Labs Lab 07/10/15 0057 07/10/15 0605 07/10/15 1142  GLUCAP 220* 146* 154*    Microbiology: Results for orders placed or performed during the hospital encounter of 05/12/15  MRSA PCR Screening     Status: None   Collection Time: 05/14/15  8:13 PM  Result Value Ref Range Status   MRSA by PCR NEGATIVE NEGATIVE Final    Comment:        The GeneXpert MRSA Assay (FDA approved for NASAL specimens only), is one component of a comprehensive MRSA colonization surveillance program. It is not intended to diagnose MRSA infection nor to guide or monitor treatment for MRSA infections.     Coagulation Studies: No results for input(s): LABPROT, INR in the last 72 hours.  Urinalysis: No results for input(s): COLORURINE, LABSPEC, PHURINE, GLUCOSEU, HGBUR, BILIRUBINUR, KETONESUR, PROTEINUR, UROBILINOGEN, NITRITE, LEUKOCYTESUR in the last 72 hours.  Invalid input(s): APPERANCEUR    Imaging: Dg Chest 2 View  07/09/2015  CLINICAL DATA:  Chest pain for 3 days EXAM: CHEST  2 VIEW COMPARISON:  05/12/2015 FINDINGS: The heart size and mediastinal contours are within normal limits. Both lungs are clear. The visualized skeletal structures are unremarkable. IMPRESSION: No active cardiopulmonary disease. Electronically Signed   By: Alcide Clever M.D.   On: 07/09/2015 21:45     Medications:   . sodium chloride Stopped (07/10/15 1500)   . amLODipine  10 mg Oral Daily  . aspirin EC  81 mg Oral Daily  . atorvastatin  80 mg Oral q1800  . buPROPion  150 mg Oral Daily  . carvedilol  12.5 mg Oral BID WC  . clopidogrel  75 mg Oral Daily  . ezetimibe  10 mg Oral QHS  . guaiFENesin  600 mg Oral BID  .  heparin  5,000 Units Subcutaneous 3 times per day  . insulin aspart  0-15 Units Subcutaneous TID AC & HS  . insulin glargine  30 Units Subcutaneous QHS  . ipratropium-albuterol  3 mL Nebulization Q6H  . isosorbide mononitrate  60 mg Oral Daily  . [START ON 07/12/2015] levofloxacin  250 mg Oral Q48H  . lisinopril  20 mg Oral QPM  . methylPREDNISolone (SOLU-MEDROL) injection  60 mg Intravenous Q6H  . morphine      . pantoprazole  40 mg Oral Daily  . ranolazine  500 mg Oral BID  . sodium chloride flush  3 mL Intravenous Q12H  . sodium chloride flush  3 mL Intravenous Q12H  . torsemide  100 mg Oral Daily   sodium chloride, acetaminophen **OR** acetaminophen, acetaminophen, morphine injection, nitroGLYCERIN, ondansetron **OR** ondansetron (ZOFRAN) IV, ondansetron (ZOFRAN) IV, promethazine, sodium chloride flush  Assessment/ Plan:  50 y.o. female with past medical history of end-stage renal disease on peritoneal dialysis, asthma, gastroparesis, diabetes mellitus, congestive heart failure, hypertension, endometriosis  [facility]/[address]/peritoneal dialysis  1.  End-stage renal disease on peritoneal dialysis.  We will proceed with peritoneal dialysis tonight.  We will use 1.25% PD solution so as to not cause significant volume depletion in the setting of cardiac catheterization nd contrast administration.  2.  Anemia chronic kidney disease.  Hemoglobin currently 9.5. She receives Epogen as an outpatient.  3.  Hyperkalemia.  Serum potassium 5.7.  This will be treated with peritoneal dialysis tonight.  4.  Secondary hyperparathyroidism.  Check phosphorus.  5.  Chest pain/coronary artery disease. Cardiac catheterization report reviewed.  Patient has significant disease however medical management is recommended at the moment.    LOS:  Kimberly Montgomery 4/10/20173:51 PM

## 2015-07-11 ENCOUNTER — Encounter: Payer: Self-pay | Admitting: Cardiovascular Disease

## 2015-07-11 LAB — GLUCOSE, CAPILLARY
GLUCOSE-CAPILLARY: 324 mg/dL — AB (ref 65–99)
Glucose-Capillary: 375 mg/dL — ABNORMAL HIGH (ref 65–99)
Glucose-Capillary: 284 mg/dL — ABNORMAL HIGH (ref 65–99)
Glucose-Capillary: 248 mg/dL — ABNORMAL HIGH (ref 65–99)
Glucose-Capillary: 322 mg/dL — ABNORMAL HIGH (ref 65–99)

## 2015-07-11 LAB — PHOSPHORUS: PHOSPHORUS: 11.3 mg/dL — AB (ref 2.5–4.6)

## 2015-07-11 LAB — LIPID PANEL
CHOL/HDL RATIO: 3.6 ratio
Cholesterol: 275 mg/dL — ABNORMAL HIGH (ref 0–200)
HDL: 76 mg/dL (ref >40)
LDL Cholesterol: 145 mg/dL — ABNORMAL HIGH (ref 0–99)
Triglycerides: 271 mg/dL — ABNORMAL HIGH (ref <150)
VLDL: 54 mg/dL — ABNORMAL HIGH (ref 0–40)

## 2015-07-11 MED ORDER — RANOLAZINE ER 500 MG PO TB12: 500.0000 mg | ORAL_TABLET | Freq: Two times a day (BID) | ORAL | Status: DC

## 2015-07-11 MED ORDER — INSULIN ASPART 100 UNIT/ML ~~LOC~~ SOLN
0.0000 [IU] | Freq: Three times a day (TID) | SUBCUTANEOUS | Status: DC
Start: 2015-07-11 — End: 2015-07-12
  Administered 2015-07-11: 11 [IU] via SUBCUTANEOUS
  Administered 2015-07-11: 5 [IU] via SUBCUTANEOUS
  Administered 2015-07-11: 8 [IU] via SUBCUTANEOUS
  Administered 2015-07-11: 15 [IU] via SUBCUTANEOUS
  Filled 2015-07-11: qty 15
  Filled 2015-07-11: qty 5
  Filled 2015-07-11: qty 8
  Filled 2015-07-11: qty 11

## 2015-07-11 MED ORDER — IPRATROPIUM-ALBUTEROL 0.5-2.5 (3) MG/3ML IN SOLN
3.0000 mL | Freq: Two times a day (BID) | RESPIRATORY_TRACT | Status: DC
  Administered 2015-07-11 – 2015-07-12 (×2): 3 mL via RESPIRATORY_TRACT
  Filled 2015-07-11 (×2): qty 3

## 2015-07-11 MED ORDER — ISOSORBIDE MONONITRATE ER 60 MG PO TB24: 60.0000 mg | ORAL_TABLET | Freq: Every day | ORAL | Status: DC

## 2015-07-11 MED ORDER — POLYETHYLENE GLYCOL 3350 17 G PO PACK
17.0000 g | PACK | Freq: Every day | ORAL | Status: DC
  Administered 2015-07-11 – 2015-07-12 (×3): 17 g via ORAL
  Filled 2015-07-11 (×3): qty 1

## 2015-07-11 NOTE — Progress Notes (Signed)
Per nephrology they want to remove more fluids today cancel discharge

## 2015-07-11 NOTE — Progress Notes (Signed)
A&O. Independent. Medicated for pain and nausea. Slept well through the night. Peritoneal dialysis given through the night.

## 2015-07-11 NOTE — Progress Notes (Signed)
SUBJECTIVE: Patient comfortable , eating breakfast   Filed Vitals:   07/10/15 1546 07/10/15 1915 07/10/15 1944 07/11/15 0423  BP: 143/66 128/71  146/72  Pulse: 71 72  77  Temp:  97.7 F (36.5 C)  97.4 F (36.3 C)  TempSrc:  Oral  Oral  Resp: 20 16  18   Height:      Weight:    265 lb 1.3 oz (120.241 kg)  SpO2: 92% 94% 94% 94%    Intake/Output Summary (Last 24 hours) at 07/11/15 0939 Last data filed at 07/11/15 0434  Gross per 24 hour  Intake    240 ml  Output    650 ml  Net   -410 ml    LABS: Basic Metabolic Panel:  Recent Labs  63/81/77 2036 07/10/15 0238 07/10/15 0751  NA 134*  --  138  K 5.2*  --  5.7*  CL 107  --  110  CO2 18*  --  20*  GLUCOSE 525*  --  136*  BUN 67*  --  69*  CREATININE 8.37* 8.03* 8.28*  CALCIUM 7.2*  --  7.2*   Liver Function Tests: No results for input(s): AST, ALT, ALKPHOS, BILITOT, PROT, ALBUMIN in the last 72 hours. No results for input(s): LIPASE, AMYLASE in the last 72 hours. CBC:  Recent Labs  07/10/15 0238 07/10/15 0751  WBC 9.3 9.0  HGB 9.7* 9.5*  HCT 29.1* 29.0*  MCV 85.8 84.3  PLT 298 290   Cardiac Enzymes:  Recent Labs  07/10/15 0238 07/10/15 0751 07/10/15 1558  TROPONINI 0.14* 0.14* 0.11*   BNP: Invalid input(s): POCBNP D-Dimer: No results for input(s): DDIMER in the last 72 hours. Hemoglobin A1C:  Recent Labs  07/10/15 0238  HGBA1C 8.2*   Fasting Lipid Panel:  Recent Labs  07/11/15 0504  CHOL 275*  HDL 76  LDLCALC 145*  TRIG 271*  CHOLHDL 3.6   Thyroid Function Tests: No results for input(s): TSH, T4TOTAL, T3FREE, THYROIDAB in the last 72 hours.  Invalid input(s): FREET3 Anemia Panel: No results for input(s): VITAMINB12, FOLATE, FERRITIN, TIBC, IRON, RETICCTPCT in the last 72 hours.   PHYSICAL EXAM General: Well developed, well nourished, in no acute distress HEENT:  Normocephalic and atramatic Neck:  No JVD.  Lungs: Clear bilaterally to auscultation and percussion. Heart: HRRR .  Normal S1 and S2 without gallops or murmurs.  Abdomen: Bowel sounds are positive, abdomen soft and non-tender  Msk:  Back normal, normal gait. Normal strength and tone for age. Extremities: No clubbing, cyanosis or edema.   Neuro: Alert and oriented X 3. Psych:  Good affect, responds appropriately  TELEMETRY:nsr  ASSESSMENT AND PLAN: CAD/Renal failure. Has diffuse 2 vesseel disease, not ideal CABG candidate, so will try aggressive medical treqtment. See he fri 2pm.  Principal Problem:   Unstable angina (HCC) Active Problems:   ESRD on peritoneal dialysis (HCC)   Accelerated hypertension   Type 2 diabetes mellitus (HCC)   CAD (coronary artery disease)   HLD (hyperlipidemia)   GERD (gastroesophageal reflux disease)    Laurier Nancy, MD, [facility] 07/11/2015 9:39 AM

## 2015-07-11 NOTE — Progress Notes (Signed)
Inpatient Diabetes Program Recommendations  AACE/ADA: New Consensus Statement on Inpatient Glycemic Control (2015)  Target Ranges:  Prepandial:   less than 140 mg/dL      Peak postprandial:   less than 180 mg/dL (1-2 hours)      Critically ill patients:  140 - 180 mg/dL  Results for TERIN, KAHRS (MRN [id number]) as of 07/11/2015 10:25  Ref. Range 07/10/2015 06:05 07/10/2015 11:42 07/10/2015 17:05 07/10/2015 21:16 07/11/2015 04:31 07/11/2015 07:26  Glucose-Capillary Latest Ref Range: 65-99 mg/dL 782 (H) 956 (H) 213 (H) 337 (H) 324 (H) 375 (H)   Review of Glycemic Control  Diabetes history: DM2 Outpatient Diabetes medications: Lantus 65 units QHS, Novolog 15 units TID with meals Current orders for Inpatient glycemic control: Lantus 30 units QHS, Novolog 0-15 units ACHS  Inpatient Diabetes Program Recommendations: Insulin - Basal: If steroids are continued as ordered (Solumedrol 60 mg Q6H), please consider increasing Lantus to 36 units QHS (based on 120 kg x 0.3 units). Insulin - Meal Coverage: If steroids are continued as ordered (Solumedrol 60 mg Q6H), please consider ordering Novolog 5 units TID with meals for meal coverage if patient eats at least 50% of meals.  Thanks, Orlando Penner, RN, MSN, CDE Diabetes Coordinator Inpatient Diabetes Program [phone] (Team Pager from 8am to 5pm) [phone] ([facility] office) [phone] [facility] office) [phone] [facility] office)

## 2015-07-11 NOTE — Progress Notes (Signed)
[facility]  ROUNDING NOTE   Subjective:  Minimal ultrafiltration was performed with peritoneal dialysis last night in order to avoid dehydration. Patient does feel some shortness of breath as a result today. Her weight appears to be stable at 120 kg.    Objective:  Vital signs in last 24 hours:  Temp:  [97.4 F (36.3 C)-97.7 F (36.5 C)] 97.4 F (36.3 C) (04/11 1129) Pulse Rate:  [70-77] 70 (04/11 1129) Resp:  [16-18] 18 (04/11 1129) BP: (128-146)/(66-72) 132/66 mmHg (04/11 1129) SpO2:  [94 %-100 %] 100 % (04/11 1129) Weight:  [120.241 kg (265 lb 1.3 oz)] 120.241 kg (265 lb 1.3 oz) (04/11 0423)  Weight change: 4.574 kg (10 lb 1.3 oz) Filed Weights   07/09/15 2030 07/10/15 0204 07/11/15 0423  Weight: 115.667 kg (255 lb) 120.974 kg (266 lb 11.2 oz) 120.241 kg (265 lb 1.3 oz)    Intake/Output: I/O last 3 completed shifts: In: 240 [P.O.:240] Out: 650 [Urine:650]   Intake/Output this shift:  Total I/O In: 360 [P.O.:360] Out: 250 [Urine:250]  Physical Exam: General: NAD, sitting up in chair  Head: Normocephalic, atraumatic. Moist oral mucosal membranes  Eyes: Anicteric  Neck: Supple, trachea midline  Lungs:  Clear to auscultation, normal effort  Heart: S1S2 no rubs  Abdomen:  Soft, nontender, BS present   Extremities: trace peripheral edema, R BKA  Neurologic: Nonfocal, moving all four extremities  Skin: No lesions  Access: PD catheter in place    Basic Metabolic Panel:  Recent Labs Lab 07/09/15 2036 07/10/15 0238 07/10/15 0751  NA 134*  --  138  K 5.2*  --  5.7*  CL 107  --  110  CO2 18*  --  20*  GLUCOSE 525*  --  136*  BUN 67*  --  69*  CREATININE 8.37* 8.03* 8.28*  CALCIUM 7.2*  --  7.2*    Liver Function Tests: No results for input(s): AST, ALT, ALKPHOS, BILITOT, PROT, ALBUMIN in the last 168 hours. No results for input(s): LIPASE, AMYLASE in the last 168 hours. No results for input(s): AMMONIA in the last 168  hours.  CBC:  Recent Labs Lab 07/09/15 2036 07/10/15 0238 07/10/15 0751  WBC 8.9 9.3 9.0  HGB 10.1* 9.7* 9.5*  HCT 30.7* 29.1* 29.0*  MCV 85.5 85.8 84.3  PLT 287 298 290    Cardiac Enzymes:  Recent Labs Lab 07/09/15 2036 07/10/15 0238 07/10/15 0751 07/10/15 1558  TROPONINI 0.03 0.14* 0.14* 0.11*    BNP: Invalid input(s): POCBNP  CBG:  Recent Labs Lab 07/10/15 1705 07/10/15 2116 07/11/15 0431 07/11/15 0726 07/11/15 1120  GLUCAP 239* 337* 324* 375* 284*    Microbiology: Results for orders placed or performed during the hospital encounter of 05/12/15  MRSA PCR Screening     Status: None   Collection Time: 05/14/15  8:13 PM  Result Value Ref Range Status   MRSA by PCR NEGATIVE NEGATIVE Final    Comment:        The GeneXpert MRSA Assay (FDA approved for NASAL specimens only), is one component of a comprehensive MRSA colonization surveillance program. It is not intended to diagnose MRSA infection nor to guide or monitor treatment for MRSA infections.     Coagulation Studies: No results for input(s): LABPROT, INR in the last 72 hours.  Urinalysis: No results for input(s): COLORURINE, LABSPEC, PHURINE, GLUCOSEU, HGBUR, BILIRUBINUR, KETONESUR, PROTEINUR, UROBILINOGEN, NITRITE, LEUKOCYTESUR in the last 72 hours.  Invalid input(s): APPERANCEUR    Imaging: Dg Chest 2  View  07/09/2015  CLINICAL DATA:  Chest pain for 3 days EXAM: CHEST  2 VIEW COMPARISON:  05/12/2015 FINDINGS: The heart size and mediastinal contours are within normal limits. Both lungs are clear. The visualized skeletal structures are unremarkable. IMPRESSION: No active cardiopulmonary disease. Electronically Signed   By: Alcide Clever M.D.   On: 07/09/2015 21:45     Medications:     . amLODipine  10 mg Oral Daily  . aspirin EC  81 mg Oral Daily  . atorvastatin  80 mg Oral q1800  . buPROPion  150 mg Oral Daily  . carvedilol  12.5 mg Oral BID WC  . clopidogrel  75 mg Oral Daily  .  dialysis solution 1.5% low-MG/low-CA   Intraperitoneal Q24H  . ezetimibe  10 mg Oral QHS  . gentamicin cream  1 application Topical Daily  . guaiFENesin  600 mg Oral BID  . heparin  5,000 Units Subcutaneous 3 times per day  . insulin aspart  0-15 Units Subcutaneous TID WC & HS  . insulin glargine  30 Units Subcutaneous QHS  . ipratropium-albuterol  3 mL Nebulization BID  . isosorbide mononitrate  60 mg Oral Daily  . [START ON 07/12/2015] levofloxacin  250 mg Oral Q48H  . lisinopril  20 mg Oral QPM  . methylPREDNISolone (SOLU-MEDROL) injection  60 mg Intravenous Q6H  . pantoprazole  40 mg Oral Daily  . polyethylene glycol  17 g Oral Daily  . ranolazine  500 mg Oral BID  . sodium chloride flush  3 mL Intravenous Q12H  . sodium chloride flush  3 mL Intravenous Q12H  . torsemide  100 mg Oral Daily   sodium chloride, acetaminophen **OR** acetaminophen, morphine injection, nitroGLYCERIN, ondansetron **OR** ondansetron (ZOFRAN) IV, promethazine, sodium chloride flush  Assessment/ Plan:  50 y.o. female with past medical history of end-stage renal disease on peritoneal dialysis, asthma, gastroparesis, diabetes mellitus, congestive heart failure, hypertension, endometriosis  [facility]/[address]/peritoneal dialysis  1.  End-stage renal disease on peritoneal dialysis.  Patient had minimal ultrafiltration withperitoneal dialysis last night as we were trying to avoid volume depletion givencontrast exposure.  We will proceed with peritoneal dialysis tonight with 2.5% dextrose solution.  2.  Anemia chronic kidney disease.  Patient to continue Epogen as an outpatient.  3.  Hyperkalemia.  Serum potassium was slightly high yesterday of 5.7 however in the interim patient had peritoneal dialysis.  4.  Secondary hyperparathyroidism.  Awaiting phosphorous today.  5.  Chest pain/coronary artery disease. Cardiac catheterization report reviewed.  Patient has significant disease however medical  management is recommended at the moment.    LOS:  Kimberly Montgomery 4/11/20174:23 PM

## 2015-07-11 NOTE — Progress Notes (Signed)
Peritoneal dialysis connect

## 2015-07-11 NOTE — Progress Notes (Signed)
Pre-peritoneal dialysis connection. 

## 2015-07-11 NOTE — Progress Notes (Signed)
Peritoneal dialysis disconnect 

## 2015-07-11 NOTE — Progress Notes (Signed)
Post peritoneal dialysis tx

## 2015-07-12 LAB — CBC
HCT: 28.4 % — ABNORMAL LOW (ref 35.0–47.0)
Hemoglobin: 9.3 g/dL — ABNORMAL LOW (ref 12.0–16.0)
MCH: 27.6 pg (ref 26.0–34.0)
MCHC: 32.6 g/dL (ref 32.0–36.0)
MCV: 84.6 fL (ref 80.0–100.0)
PLATELETS: 342 10*3/uL (ref 150–440)
RBC: 3.36 MIL/uL — ABNORMAL LOW (ref 3.80–5.20)
RDW: 14.7 % — AB (ref 11.5–14.5)
WBC: 14.3 10*3/uL — ABNORMAL HIGH (ref 3.6–11.0)

## 2015-07-12 LAB — GLUCOSE, CAPILLARY
GLUCOSE-CAPILLARY: 442 mg/dL — AB (ref 65–99)
GLUCOSE-CAPILLARY: 448 mg/dL — AB (ref 65–99)
Glucose-Capillary: 372 mg/dL — ABNORMAL HIGH (ref 65–99)

## 2015-07-12 LAB — HEPATITIS B SURFACE ANTIBODY, QUANTITATIVE: Hepatitis B-Post: 5.8 m[IU]/mL — ABNORMAL LOW (ref >9.9)

## 2015-07-12 LAB — HEPATITIS B SURFACE ANTIGEN: Hepatitis B Surface Ag: NEGATIVE

## 2015-07-12 MED ORDER — DIPHENHYDRAMINE HCL 50 MG/ML IJ SOLN
12.5000 mg | Freq: Once | INTRAMUSCULAR | Status: AC
  Administered 2015-07-12: 12.5 mg via INTRAVENOUS
  Filled 2015-07-12: qty 1

## 2015-07-12 MED ORDER — INSULIN ASPART 100 UNIT/ML ~~LOC~~ SOLN
20.0000 [IU] | Freq: Once | SUBCUTANEOUS | Status: AC
  Administered 2015-07-12: 20 [IU] via SUBCUTANEOUS
  Filled 2015-07-12: qty 20

## 2015-07-12 NOTE — Progress Notes (Signed)
MD Allena Katz was notified of FS 443. Novolog 20 units was ordered. No further concerns at this time.

## 2015-07-12 NOTE — Progress Notes (Addendum)
SUBJECTIVE: Kimberly Montgomery is doing well this morning, sitting up to eat breakfast. She denies chest pain or shortness of breath.   Filed Vitals:   07/12/15 0416 07/12/15 0418 07/12/15 0747 07/12/15 0752  BP: 142/67  143/75   Pulse: 83  90   Temp: 97.8 F (36.6 C)     TempSrc: Oral     Resp: 18  20   Height:      Weight: 280 lb 6.4 oz (127.189 kg) 270 lb 12.8 oz (122.834 kg)    SpO2: 95%  97% 93%    Intake/Output Summary (Last 24 hours) at 07/12/15 0841 Last data filed at 07/12/15 0757  Gross per 24 hour  Intake    603 ml  Output   1514 ml  Net   -911 ml    LABS: Basic Metabolic Panel:  Recent Labs  16/10/96 2036 07/10/15 0238 07/10/15 0751 07/11/15 1702  NA 134*  --  138  --   K 5.2*  --  5.7*  --   CL 107  --  110  --   CO2 18*  --  20*  --   GLUCOSE 525*  --  136*  --   BUN 67*  --  69*  --   CREATININE 8.37* 8.03* 8.28*  --   CALCIUM 7.2*  --  7.2*  --   PHOS  --   --   --  11.3*   Liver Function Tests: No results for input(s): AST, ALT, ALKPHOS, BILITOT, PROT, ALBUMIN in the last 72 hours. No results for input(s): LIPASE, AMYLASE in the last 72 hours. CBC:  Recent Labs  07/10/15 0751 07/12/15 0429  WBC 9.0 14.3*  HGB 9.5* 9.3*  HCT 29.0* 28.4*  MCV 84.3 84.6  PLT 290 342   Cardiac Enzymes:  Recent Labs  07/10/15 0238 07/10/15 0751 07/10/15 1558  TROPONINI 0.14* 0.14* 0.11*   BNP: Invalid input(s): POCBNP D-Dimer: No results for input(s): DDIMER in the last 72 hours. Hemoglobin A1C:  Recent Labs  07/10/15 0238  HGBA1C 8.2*   Fasting Lipid Panel:  Recent Labs  07/11/15 0504  CHOL 275*  HDL 76  LDLCALC 145*  TRIG 271*  CHOLHDL 3.6   Thyroid Function Tests: No results for input(s): TSH, T4TOTAL, T3FREE, THYROIDAB in the last 72 hours.  Invalid input(s): FREET3 Anemia Panel: No results for input(s): VITAMINB12, FOLATE, FERRITIN, TIBC, IRON, RETICCTPCT in the last 72 hours.   PHYSICAL EXAM General: Well developed, well  nourished, in no acute distress HEENT:  Normocephalic and atramatic Neck:  No JVD.  Lungs: Faint end expiratory wheezes. Heart: HRRR . Normal S1 and S2 without gallops or murmurs.  Abdomen: Bowel sounds are positive, abdomen soft and non-tender  Msk:  Back normal, normal gait. Normal strength and tone for age. Right BKA Extremities: No clubbing, cyanosis or edema.  Right groin is ecchymotic but otherwise unremarkable Neuro: Alert and oriented X 3. Psych:  Good affect, responds appropriately  TELEMETRY: Normal sinus rhythm with a rate of 90 bpm  ASSESSMENT AND PLAN: This patient was admitted for assessment of repeated episodes of chest pain. She had mildly elevated troponin levels and it was decided to proceed with cardiac catheterization. This revealed high-grade diffuse lesion in proximal LAD, and distal LAD with severe disease in the OM to also has severe disease at the bifurcation. Previous stent in LAD, is patent she has an EF of 60%. Patient with diffuse three-vessel disease and not a good candidate for bypass  surgery. We will attempt aggressive medical treatment with Imdur and/or Ranexa and Plavix. She has a follow-up appointment for this Thursday at 2 PM at [facility] cardiology where we will address both medical and lifestyle modifications to decrease her cardiac risk.  Principal Problem:   Unstable angina (HCC) Active Problems:   ESRD on peritoneal dialysis (HCC)   Accelerated hypertension   Type 2 diabetes mellitus (HCC)   CAD (coronary artery disease)   HLD (hyperlipidemia)   GERD (gastroesophageal reflux disease)    Berton Bon, NP 07/12/2015 8:41 AM

## 2015-07-12 NOTE — Progress Notes (Signed)
Pt complaining of itching, requesting benadryl. MD paged, Dr. Sheryle Hail to put in orders for benadryl. Will continue to monitor. Shirley Friar, RN

## 2015-07-12 NOTE — Progress Notes (Signed)
Peritoneal dialysis disconnect

## 2015-07-12 NOTE — Discharge Summary (Signed)
Kimberly Montgomery, 50 y.o., DOB 1965/06/07, MRN [id number]. Admission date: 07/09/2015 Discharge Date 07/12/2015 Primary MD No primary care provider on file. Admitting Physician Oralia Manis, MD  Admission Diagnosis  Unstable angina [facility]) [I20.0]  Discharge Diagnosis   Principal Problem:   Unstable angina (HCC)    ESRD on peritoneal dialysis (HCC)   Accelerated hypertension   Type 2 diabetes mellitus (HCC)   CAD (coronary artery disease)   HLD (hyperlipidemia)   GERD (gastroesophageal reflux disease)  Nicotine abuse        Hospital Course Kimberly Montgomery is a 50 y.o. female who presents with resistant chest pain. Patient states that her chest pain started 4 days ago. The pain is across her chest, radiates into her back and down her left arm. It is waxing and waning, but at its worst she rates it as a 10 out of 10. She took multiple doses of sublingual nitroglycerin at home with some improvement, but never complete resolution. Due to the symptoms she came to the emergency room. Troponin was slightly abnormal. EKG without any changes. She was seen in consultation by cardiology who performed a cardiac catheter. Catheterization showed high grade diffuse lesion in proximal LAD, but distal LAD is severe, and OM2 is severre at bifurcation. Stent in LAD patent. EF 60%. Patient    Dg Chest 2 View  07/09/2015  CLINICAL DATA:  Chest pain for 3 days EXAM: CHEST  2 VIEW COMPARISON:  05/12/2015 FINDINGS: The heart size and mediastinal contours are within normal limits. Both lungs are clear. The visualized skeletal structures are unremarkable. IMPRESSION: No active cardiopulmonary disease. Electronically Signed   By: Alcide Clever M.D.   On: 07/09/2015 21:45       Today   Subjective:   Kimberly Montgomery  [facility] well this morning denies any cp  Objective:   Blood pressure 143/75, pulse 90, temperature 97.8 F (36.6 C), temperature source Oral, resp. rate 20, height 5\' 9"  (1.753 m), weight 122.834 kg (270 lb  12.8 oz), last menstrual period 03/16/2015, SpO2 93 %.  .  Intake/Output Summary (Last 24 hours) at 07/12/15 1352 Last data filed at 07/12/15 0830  Gross per 24 hour  Intake    483 ml  Output   1304 ml  Net   -821 ml    Exam VITAL SIGNS: Blood pressure 143/75, pulse 90, temperature 97.8 F (36.6 C), temperature source Oral, resp. rate 20, height 5\' 9"  (1.753 m), weight 122.834 kg (270 lb 12.8 oz), last menstrual period 03/16/2015, SpO2 93 %.  GENERAL:  50 y.o.-year-old patient lying in the bed with no acute distress.  EYES: Pupils equal, round, reactive to light and accommodation. No scleral icterus. Extraocular muscles intact.  HEENT: Head atraumatic, normocephalic. Oropharynx and nasopharynx clear.  NECK:  Supple, no jugular venous distention. No thyroid enlargement, no tenderness.  LUNGS: Normal breath sounds bilaterally, no wheezing, rales,rhonchi or crepitation. No use of accessory muscles of respiration.  CARDIOVASCULAR: S1, S2 normal. No murmurs, rubs, or gallops.  ABDOMEN: Soft, nontender, nondistended. Bowel sounds present. No organomegaly or mass.  EXTREMITIES: No pedal edema, cyanosis, or clubbing.  NEUROLOGIC: Cranial nerves II through XII are intact. Muscle strength 5/5 in all extremities. Sensation intact. Gait not checked.  PSYCHIATRIC: The patient is alert and oriented x 3.  SKIN: No obvious rash, lesion, or ulcer.   Data Review     CBC w Diff: Lab Results  Component Value Date   WBC 14.3* 07/12/2015   WBC 10.1 07/17/2014  HGB 9.3* 07/12/2015   HGB 11.5* 07/17/2014   HCT 28.4* 07/12/2015   HCT 35.2 07/17/2014   PLT 342 07/12/2015   PLT 316 07/17/2014   LYMPHOPCT 29.3 07/17/2014   MONOPCT 5.5 07/17/2014   EOSPCT 1.2 07/17/2014   BASOPCT 0.7 07/17/2014   CMP: Lab Results  Component Value Date   NA 138 07/10/2015   NA 136 07/17/2014   K 5.7* 07/10/2015   K 3.5 07/17/2014   CL 110 07/10/2015   CL 103 07/17/2014   CO2 20* 07/10/2015   CO2 26  07/17/2014   BUN 69* 07/10/2015   BUN 32* 07/17/2014   CREATININE 8.28* 07/10/2015   CREATININE 3.45* 07/17/2014   PROT 5.7* 07/16/2014   ALBUMIN 2.0* 05/15/2015   ALBUMIN 2.6* 07/16/2014   BILITOT 0.4 07/16/2014   ALKPHOS 104 07/16/2014   AST 14* 07/16/2014   ALT 9* 07/16/2014  .  Micro Results No results found for this or any previous visit (from the past 240 hour(s)).   Code Status History    Date Active Date Inactive Code Status Order ID Comments User Context   07/10/2015  1:49 AM 07/12/2015  1:01 PM Full Code [id number]  Oralia Manis, MD Inpatient   05/12/2015  3:46 PM 05/16/2015  5:35 PM Full Code [id number]  Auburn Bilberry, MD ED          Follow-up Information    Follow up with  pcp In 7 days.   Why:  You will need to call for an appointment, ccs      Follow up with Laurier Nancy, MD.   Specialty:  Cardiology   Why:  Tuesday, April 25th at 915am, ccs   Contact information:   [address] [place] [address] [phone]       Discharge Medications     Medication List    TAKE these medications        acetaminophen 500 MG tablet  Commonly known as:  TYLENOL  Take 1,000 mg by mouth every 6 (six) hours as needed for mild pain.     albuterol 108 (90 Base) MCG/ACT inhaler  Commonly known as:  PROVENTIL HFA;VENTOLIN HFA  Inhale 2 puffs into the lungs every 6 (six) hours as needed for wheezing or shortness of breath.     amLODipine 10 MG tablet  Commonly known as:  NORVASC  Take 10 mg by mouth daily.     aspirin EC 81 MG tablet  Take 81 mg by mouth daily.     aspirin-acetaminophen-caffeine 250-250-65 MG tablet  Commonly known as:  EXCEDRIN MIGRAINE  Take 2 tablets by mouth every 6 (six) hours as needed for headache.     buPROPion 150 MG 12 hr tablet  Commonly known as:  WELLBUTRIN SR  Take 150 mg by mouth daily.     calcium acetate (Phos Binder) 667 MG/5ML Soln  Commonly known as:  PHOSLYRA  Take 2,668 mg by mouth 3 (three) times daily with  meals.     carvedilol 6.25 MG tablet  Commonly known as:  COREG  Take 6.25 mg by mouth 2 (two) times daily with a meal.     clopidogrel 75 MG tablet  Commonly known as:  PLAVIX  Take 75 mg by mouth daily.     docusate sodium 100 MG capsule  Commonly known as:  COLACE  Take 100 mg by mouth at bedtime as needed for mild constipation.     ezetimibe-simvastatin 10-40 MG tablet  Commonly known as:  VYTORIN  Take 1 tablet by mouth at bedtime.     gabapentin 600 MG tablet  Commonly known as:  NEURONTIN  Take 600 mg by mouth 3 (three) times daily.     insulin aspart 100 UNIT/ML injection  Commonly known as:  novoLOG  Inject 15 Units into the skin 3 (three) times daily with meals.     insulin glargine 100 UNIT/ML injection  Commonly known as:  LANTUS  Inject 65 Units into the skin at bedtime.     isosorbide mononitrate 60 MG 24 hr tablet  Commonly known as:  IMDUR  Take 1 tablet (60 mg total) by mouth daily.     lisinopril 40 MG tablet  Commonly known as:  PRINIVIL,ZESTRIL  Take 40 mg by mouth daily.     nitroGLYCERIN 0.4 MG SL tablet  Commonly known as:  NITROSTAT  Place 0.4 mg under the tongue every 5 (five) minutes as needed for chest pain. Reported on 07/10/2015     omeprazole 40 MG capsule  Commonly known as:  PRILOSEC  Take 40 mg by mouth 2 (two) times daily.     ondansetron 4 MG disintegrating tablet  Commonly known as:  ZOFRAN-ODT  Take 4 mg by mouth every 8 (eight) hours as needed for nausea or vomiting.     polyethylene glycol packet  Commonly known as:  MIRALAX / GLYCOLAX  Take 17 g by mouth daily as needed for mild constipation.     ranolazine 500 MG 12 hr tablet  Commonly known as:  RANEXA  Take 1 tablet (500 mg total) by mouth 2 (two) times daily.     torsemide 100 MG tablet  Commonly known as:  DEMADEX  Take 100 mg by mouth daily.           Total Time in preparing paper work, data evaluation and todays exam - 35 minutes  Auburn Bilberry M.D  on 07/12/2015 at 1:52 PM  [facility]   Office  [phone]

## 2015-07-12 NOTE — Progress Notes (Signed)
[facility] [place] at [facility]                                                                                                                                                                                            Patient Demographics   Kimberly Montgomery, is a 50 y.o. female, DOB - [birth year], XJO:[id number]  Admit date - 07/09/2015   Admitting Physician Oralia Manis, MD  Outpatient Primary MD for the patient is No primary care provider on file.   LOS -   Subjective:cardiac cath results noted, case reviewed with cardiology Pt still c/o intrermittent cp,   Review of Systems:   CONSTITUTIONAL: No documented fever. No fatigue, weakness. No weight gain, no weight loss.  EYES: No blurry or double vision.  ENT: No tinnitus. No postnasal drip. No redness of the oropharynx.  RESPIRATORY:Positive cough, positive wheeze, no hemoptysis. Positive dyspnea.  CARDIOVASCULAR: Positive chest pain. No orthopnea. No palpitations. No syncope.  GASTROINTESTINAL: No nausea, no vomiting or diarrhea. No abdominal pain. No melena or hematochezia.  GENITOURINARY: No dysuria or hematuria.  ENDOCRINE: No polyuria or nocturia. No heat or cold intolerance.  HEMATOLOGY: No anemia. No bruising. No bleeding.  INTEGUMENTARY: No rashes. No lesions.  MUSCULOSKELETAL: No arthritis. No swelling. No gout.  NEUROLOGIC: No numbness, tingling, or ataxia. No seizure-type activity.  PSYCHIATRIC: No anxiety. No insomnia. No ADD.    Vitals:   Filed Vitals:   07/12/15 0416 07/12/15 0418 07/12/15 0747 07/12/15 0752  BP: 142/67  143/75   Pulse: 83  90   Temp: 97.8 F (36.6 C)     TempSrc: Oral     Resp: 18  20   Height:      Weight: 127.189 kg (280 lb 6.4 oz) 122.834 kg (270 lb 12.8 oz)    SpO2: 95%  97% 93%    Wt Readings from Last 3 Encounters:  07/12/15 122.834 kg (270 lb 12.8 oz)  05/16/15 116.801 kg (257 lb 8 oz)     Intake/Output Summary (Last 24 hours) at 07/12/15 0812 Last  data filed at 07/12/15 0757  Gross per 24 hour  Intake    603 ml  Output   1514 ml  Net   -911 ml    Physical Exam:   GENERAL: Pleasant-appearing in no apparent distress.  HEAD, EYES, EARS, NOSE AND THROAT: Atraumatic, normocephalic. Extraocular muscles are intact. Pupils equal and reactive to light. Sclerae anicteric. No conjunctival injection. No oro-pharyngeal erythema.  NECK: Supple. There is no jugular venous distention. No bruits, no lymphadenopathy, no thyromegaly.  HEART: Regular rate and rhythm,. No murmurs, no rubs, no  clicks.  LUNGS: B/L weezing bilaterally. No rales or rhonchi.  ABDOMEN: Soft, flat, nontender, nondistended. Has good bowel sounds. No hepatosplenomegaly appreciated.  EXTREMITIES: No evidence of any cyanosis, clubbing, or peripheral edema.  +2 pedal and radial pulses bilaterally.  NEUROLOGIC: The patient is alert, awake, and oriented x3 with no focal motor or sensory deficits appreciated bilaterally.  SKIN: Moist and warm with no rashes appreciated.  Psych: Not anxious, depressed LN: No inguinal LN enlargement    Antibiotics   Anti-infectives    Start     Dose/Rate Route Frequency Ordered Stop   07/12/15 1000  levofloxacin (LEVAQUIN) tablet 250 mg     250 mg Oral Every 48 hours 07/10/15 0818     07/10/15 0830  levofloxacin (LEVAQUIN) tablet 500 mg     500 mg Oral  Once 07/10/15 0808 07/10/15 0912      Medications   Scheduled Meds: . amLODipine  10 mg Oral Daily  . aspirin EC  81 mg Oral Daily  . atorvastatin  80 mg Oral q1800  . buPROPion  150 mg Oral Daily  . carvedilol  12.5 mg Oral BID WC  . clopidogrel  75 mg Oral Daily  . ezetimibe  10 mg Oral QHS  . gentamicin cream  1 application Topical Daily  . guaiFENesin  600 mg Oral BID  . heparin  5,000 Units Subcutaneous 3 times per day  . insulin aspart  0-15 Units Subcutaneous TID WC & HS  . insulin aspart  20 Units Subcutaneous Once  . insulin glargine  30 Units Subcutaneous QHS  .  ipratropium-albuterol  3 mL Nebulization BID  . isosorbide mononitrate  60 mg Oral Daily  . levofloxacin  250 mg Oral Q48H  . lisinopril  20 mg Oral QPM  . methylPREDNISolone (SOLU-MEDROL) injection  60 mg Intravenous Q6H  . pantoprazole  40 mg Oral Daily  . polyethylene glycol  17 g Oral Daily  . ranolazine  500 mg Oral BID  . sodium chloride flush  3 mL Intravenous Q12H  . sodium chloride flush  3 mL Intravenous Q12H  . torsemide  100 mg Oral Daily   Continuous Infusions:  PRN Meds:.sodium chloride, acetaminophen **OR** acetaminophen, morphine injection, nitroGLYCERIN, ondansetron **OR** ondansetron (ZOFRAN) IV, promethazine, sodium chloride flush   Data Review:   Micro Results No results found for this or any previous visit (from the past 240 hour(s)).  Radiology Reports Dg Chest 2 View  07/09/2015  CLINICAL DATA:  Chest pain for 3 days EXAM: CHEST  2 VIEW COMPARISON:  05/12/2015 FINDINGS: The heart size and mediastinal contours are within normal limits. Both lungs are clear. The visualized skeletal structures are unremarkable. IMPRESSION: No active cardiopulmonary disease. Electronically Signed   By: Alcide Clever M.D.   On: 07/09/2015 21:45     CBC  Recent Labs Lab 07/09/15 2036 07/10/15 0238 07/10/15 0751 07/12/15 0429  WBC 8.9 9.3 9.0 14.3*  HGB 10.1* 9.7* 9.5* 9.3*  HCT 30.7* 29.1* 29.0* 28.4*  PLT 287 298 290 342  MCV 85.5 85.8 84.3 84.6  MCH 28.0 28.5 27.7 27.6  MCHC 32.8 33.2 32.9 32.6  RDW 14.2 14.6* 14.7* 14.7*    Chemistries   Recent Labs Lab 07/09/15 2036 07/10/15 0238 07/10/15 0751  NA 134*  --  138  K 5.2*  --  5.7*  CL 107  --  110  CO2 18*  --  20*  GLUCOSE 525*  --  136*  BUN 67*  --  69*  CREATININE 8.37* 8.03* 8.28*  CALCIUM 7.2*  --  7.2*   ------------------------------------------------------------------------------------------------------------------ estimated creatinine clearance is 11.3 mL/min (by C-G formula based on Cr of  8.28). ------------------------------------------------------------------------------------------------------------------  Recent Labs  07/10/15 0238  HGBA1C 8.2*   ------------------------------------------------------------------------------------------------------------------  Recent Labs  07/11/15 0504  CHOL 275*  HDL 76  LDLCALC 145*  TRIG 271*  CHOLHDL 3.6   ------------------------------------------------------------------------------------------------------------------ No results for input(s): TSH, T4TOTAL, T3FREE, THYROIDAB in the last 72 hours.  Invalid input(s): FREET3 ------------------------------------------------------------------------------------------------------------------ No results for input(s): VITAMINB12, FOLATE, FERRITIN, TIBC, IRON, RETICCTPCT in the last 72 hours.  Coagulation profile No results for input(s): INR, PROTIME in the last 168 hours.  No results for input(s): DDIMER in the last 72 hours.  Cardiac Enzymes  Recent Labs Lab 07/10/15 0238 07/10/15 0751 07/10/15 1558  TROPONINI 0.14* 0.14* 0.11*   ------------------------------------------------------------------------------------------------------------------ Invalid input(s): POCBNP    Assessment & Plan  Patient with 50 year old with end-stage renal disease coronary artery disease presents with chest pain. 1.   Unstable angina with obstructive heart disease noted on cath, I dw Dr. Welton Flakes he feels medical manamgent is best option, he will dw with md at [facility] regardgin other option Pt started on imdure and ranexa  2. Acute bronchospasm  with acute bronchitis:  Improved, continue current therapy  3.  ESRD on peritoneal dialysis [facility]) nephrology wishes to remove more fluid hold d/c today  4.  Accelerated hypertension: Blood pressure is currently elevated continue amlodipine, lisinopril , prn IV hydralazine  5.   Type 2 diabetes mellitus (HCC) under poor control continue  sliding scale    6.  HLD (hyperlipidemia) continue Zetia and Lipitor will need adjustmetn as out patient  7. Miscellaneous heparin for DVT prophylaxis      Code Status Orders        Start     Ordered   07/10/15 0150  Full code   Continuous     07/10/15 0149    Code Status History    Date Active Date Inactive Code Status Order ID Comments User Context   05/12/2015  3:46 PM 05/16/2015  5:35 PM Full Code [id number]  Auburn Bilberry, MD ED           Consults Cardiology DVT Prophylaxis  heparin  Lab Results  Component Value Date   PLT 342 07/12/2015     Time Spent in minutes     Greater than 50% of time spent in care coordination and counseling patient regarding the condition and plan of care.   Auburn Bilberry M.D on 07/12/2015 at 8:12 AM  Between 7am to 6pm - Pager - [phone]  After 6pm go to [email or web address] - password [email or web address] [facility]  [facility]   Office  [phone]

## 2015-07-12 NOTE — Progress Notes (Signed)
Post peritoneal dialysis 

## 2015-07-12 NOTE — Progress Notes (Signed)
A & O. Ambulating in the room and tolerated it well. NSR. Room air. Pt reports no pain. Cath site clear of bleeding and hematoma. Takes meds ok. IV and tele removed. Discharge instructions given to pt. Pt has no further concerns at this time.

## 2015-07-15 ENCOUNTER — Encounter: Payer: Self-pay | Admitting: Emergency Medicine

## 2015-07-15 ENCOUNTER — Inpatient Hospital Stay
Admission: EM | Admit: 2015-07-15 | Discharge: 2015-08-02 | DRG: 371 | Disposition: A | Discharge status: Home Health | Payer: Medicare HMO | Attending: Internal Medicine | Admitting: Internal Medicine

## 2015-07-15 ENCOUNTER — Emergency Department: Payer: Medicare HMO

## 2015-07-15 DIAGNOSIS — J45909 Unspecified asthma, uncomplicated: Secondary | ICD-10-CM | POA: Diagnosis present

## 2015-07-15 DIAGNOSIS — E1143 Type 2 diabetes mellitus with diabetic autonomic (poly)neuropathy: Secondary | ICD-10-CM | POA: Diagnosis present

## 2015-07-15 DIAGNOSIS — Z992 Dependence on renal dialysis: Secondary | ICD-10-CM

## 2015-07-15 DIAGNOSIS — K652 Spontaneous bacterial peritonitis: Secondary | ICD-10-CM | POA: Diagnosis not present

## 2015-07-15 DIAGNOSIS — M47816 Spondylosis without myelopathy or radiculopathy, lumbar region: Secondary | ICD-10-CM | POA: Diagnosis present

## 2015-07-15 DIAGNOSIS — Z818 Family history of other mental and behavioral disorders: Secondary | ICD-10-CM

## 2015-07-15 DIAGNOSIS — Z880 Allergy status to penicillin: Secondary | ICD-10-CM

## 2015-07-15 DIAGNOSIS — E1122 Type 2 diabetes mellitus with diabetic chronic kidney disease: Secondary | ICD-10-CM

## 2015-07-15 DIAGNOSIS — Z90710 Acquired absence of both cervix and uterus: Secondary | ICD-10-CM

## 2015-07-15 DIAGNOSIS — R197 Diarrhea, unspecified: Secondary | ICD-10-CM

## 2015-07-15 DIAGNOSIS — Z7982 Long term (current) use of aspirin: Secondary | ICD-10-CM

## 2015-07-15 DIAGNOSIS — R1110 Vomiting, unspecified: Secondary | ICD-10-CM

## 2015-07-15 DIAGNOSIS — R29898 Other symptoms and signs involving the musculoskeletal system: Secondary | ICD-10-CM

## 2015-07-15 DIAGNOSIS — E1144 Type 2 diabetes mellitus with diabetic amyotrophy: Secondary | ICD-10-CM | POA: Diagnosis present

## 2015-07-15 DIAGNOSIS — K659 Peritonitis, unspecified: Secondary | ICD-10-CM | POA: Diagnosis present

## 2015-07-15 DIAGNOSIS — Z923 Personal history of irradiation: Secondary | ICD-10-CM

## 2015-07-15 DIAGNOSIS — E1159 Type 2 diabetes mellitus with other circulatory complications: Secondary | ICD-10-CM | POA: Diagnosis present

## 2015-07-15 DIAGNOSIS — K219 Gastro-esophageal reflux disease without esophagitis: Secondary | ICD-10-CM | POA: Diagnosis present

## 2015-07-15 DIAGNOSIS — K650 Generalized (acute) peritonitis: Secondary | ICD-10-CM | POA: Diagnosis not present

## 2015-07-15 DIAGNOSIS — I132 Hypertensive heart and chronic kidney disease with heart failure and with stage 5 chronic kidney disease, or end stage renal disease: Secondary | ICD-10-CM | POA: Diagnosis present

## 2015-07-15 DIAGNOSIS — R112 Nausea with vomiting, unspecified: Secondary | ICD-10-CM

## 2015-07-15 DIAGNOSIS — Z888 Allergy status to other drugs, medicaments and biological substances status: Secondary | ICD-10-CM

## 2015-07-15 DIAGNOSIS — Z79899 Other long term (current) drug therapy: Secondary | ICD-10-CM

## 2015-07-15 DIAGNOSIS — Z5320 Procedure and treatment not carried out because of patient's decision for unspecified reasons: Secondary | ICD-10-CM | POA: Diagnosis present

## 2015-07-15 DIAGNOSIS — Z8049 Family history of malignant neoplasm of other genital organs: Secondary | ICD-10-CM

## 2015-07-15 DIAGNOSIS — K3184 Gastroparesis: Secondary | ICD-10-CM | POA: Diagnosis present

## 2015-07-15 DIAGNOSIS — I2510 Atherosclerotic heart disease of native coronary artery without angina pectoris: Secondary | ICD-10-CM | POA: Diagnosis present

## 2015-07-15 DIAGNOSIS — R0682 Tachypnea, not elsewhere classified: Secondary | ICD-10-CM | POA: Diagnosis present

## 2015-07-15 DIAGNOSIS — N2581 Secondary hyperparathyroidism of renal origin: Secondary | ICD-10-CM | POA: Diagnosis present

## 2015-07-15 DIAGNOSIS — L97529 Non-pressure chronic ulcer of other part of left foot with unspecified severity: Secondary | ICD-10-CM | POA: Diagnosis present

## 2015-07-15 DIAGNOSIS — R748 Abnormal levels of other serum enzymes: Secondary | ICD-10-CM | POA: Diagnosis present

## 2015-07-15 DIAGNOSIS — F17210 Nicotine dependence, cigarettes, uncomplicated: Secondary | ICD-10-CM | POA: Diagnosis present

## 2015-07-15 DIAGNOSIS — Z794 Long term (current) use of insulin: Secondary | ICD-10-CM

## 2015-07-15 DIAGNOSIS — Z9889 Other specified postprocedural states: Secondary | ICD-10-CM

## 2015-07-15 DIAGNOSIS — K567 Ileus, unspecified: Secondary | ICD-10-CM

## 2015-07-15 DIAGNOSIS — Z89611 Acquired absence of right leg above knee: Secondary | ICD-10-CM

## 2015-07-15 DIAGNOSIS — I5032 Chronic diastolic (congestive) heart failure: Secondary | ICD-10-CM | POA: Diagnosis present

## 2015-07-15 DIAGNOSIS — R110 Nausea: Secondary | ICD-10-CM

## 2015-07-15 DIAGNOSIS — N186 End stage renal disease: Secondary | ICD-10-CM

## 2015-07-15 DIAGNOSIS — G40409 Other generalized epilepsy and epileptic syndromes, not intractable, without status epilepticus: Secondary | ICD-10-CM | POA: Diagnosis present

## 2015-07-15 DIAGNOSIS — Z833 Family history of diabetes mellitus: Secondary | ICD-10-CM

## 2015-07-15 DIAGNOSIS — Z6841 Body Mass Index (BMI) 40.0 and over, adult: Secondary | ICD-10-CM

## 2015-07-15 DIAGNOSIS — L03116 Cellulitis of left lower limb: Secondary | ICD-10-CM | POA: Diagnosis present

## 2015-07-15 DIAGNOSIS — K449 Diaphragmatic hernia without obstruction or gangrene: Secondary | ICD-10-CM | POA: Diagnosis present

## 2015-07-15 DIAGNOSIS — M5136 Other intervertebral disc degeneration, lumbar region: Secondary | ICD-10-CM | POA: Diagnosis present

## 2015-07-15 DIAGNOSIS — R569 Unspecified convulsions: Secondary | ICD-10-CM

## 2015-07-15 DIAGNOSIS — D631 Anemia in chronic kidney disease: Secondary | ICD-10-CM | POA: Diagnosis present

## 2015-07-15 DIAGNOSIS — R4182 Altered mental status, unspecified: Secondary | ICD-10-CM

## 2015-07-15 DIAGNOSIS — E11621 Type 2 diabetes mellitus with foot ulcer: Secondary | ICD-10-CM | POA: Diagnosis present

## 2015-07-15 DIAGNOSIS — T426X5A Adverse effect of other antiepileptic and sedative-hypnotic drugs, initial encounter: Secondary | ICD-10-CM | POA: Diagnosis present

## 2015-07-15 DIAGNOSIS — E871 Hypo-osmolality and hyponatremia: Secondary | ICD-10-CM | POA: Diagnosis present

## 2015-07-15 DIAGNOSIS — B369 Superficial mycosis, unspecified: Secondary | ICD-10-CM | POA: Diagnosis present

## 2015-07-15 DIAGNOSIS — E039 Hypothyroidism, unspecified: Secondary | ICD-10-CM | POA: Diagnosis present

## 2015-07-15 DIAGNOSIS — F319 Bipolar disorder, unspecified: Secondary | ICD-10-CM

## 2015-07-15 DIAGNOSIS — L0390 Cellulitis, unspecified: Secondary | ICD-10-CM

## 2015-07-15 DIAGNOSIS — Z8249 Family history of ischemic heart disease and other diseases of the circulatory system: Secondary | ICD-10-CM

## 2015-07-15 DIAGNOSIS — D72825 Bandemia: Secondary | ICD-10-CM | POA: Diagnosis present

## 2015-07-15 LAB — COMPREHENSIVE METABOLIC PANEL
ALT: 13 U/L — ABNORMAL LOW (ref 14–54)
ANION GAP: 16 — AB (ref 5–15)
AST: 16 U/L (ref 15–41)
Albumin: 2.7 g/dL — ABNORMAL LOW (ref 3.5–5.0)
Alkaline Phosphatase: 102 U/L (ref 38–126)
BUN: 92 mg/dL — ABNORMAL HIGH (ref 6–20)
CHLORIDE: 100 mmol/L — AB (ref 101–111)
CO2: 20 mmol/L — AB (ref 22–32)
Calcium: 6.2 mg/dL — CL (ref 8.9–10.3)
Creatinine, Ser: 10.47 mg/dL — ABNORMAL HIGH (ref 0.44–1.00)
GFR, EST AFRICAN AMERICAN: 4 mL/min — AB (ref >60)
GFR, EST NON AFRICAN AMERICAN: 4 mL/min — AB (ref >60)
Glucose, Bld: 155 mg/dL — ABNORMAL HIGH (ref 65–99)
Potassium: 4.5 mmol/L (ref 3.5–5.1)
SODIUM: 136 mmol/L (ref 135–145)
Total Bilirubin: 0.6 mg/dL (ref 0.3–1.2)
Total Protein: 5.8 g/dL — ABNORMAL LOW (ref 6.5–8.1)

## 2015-07-15 LAB — CBC
HCT: 31.8 % — ABNORMAL LOW (ref 35.0–47.0)
HEMOGLOBIN: 10.7 g/dL — AB (ref 12.0–16.0)
MCH: 28.1 pg (ref 26.0–34.0)
MCHC: 33.5 g/dL (ref 32.0–36.0)
MCV: 83.8 fL (ref 80.0–100.0)
PLATELETS: 352 10*3/uL (ref 150–440)
RBC: 3.79 MIL/uL — AB (ref 3.80–5.20)
RDW: 14.4 % (ref 11.5–14.5)
WBC: 9.7 10*3/uL (ref 3.6–11.0)

## 2015-07-15 LAB — URINALYSIS COMPLETE WITH MICROSCOPIC (ARMC ONLY)
BILIRUBIN URINE: NEGATIVE
LEUKOCYTES UA: NEGATIVE
Nitrite: NEGATIVE
Protein, ur: >500 mg/dL — AB
SPECIFIC GRAVITY, URINE: 1.015 (ref 1.005–1.030)
pH: 6 (ref 5.0–8.0)

## 2015-07-15 LAB — TROPONIN I: TROPONIN I: 0.05 ng/mL — AB (ref <0.031)

## 2015-07-15 LAB — LIPASE, BLOOD: LIPASE: 23 U/L (ref 11–51)

## 2015-07-15 MED ORDER — DIATRIZOATE MEGLUMINE & SODIUM 66-10 % PO SOLN
15.0000 mL | ORAL | Status: AC
  Administered 2015-07-15 (×2): 15 mL via ORAL
  Filled 2015-07-15: qty 30

## 2015-07-15 MED ORDER — MORPHINE SULFATE (PF) 4 MG/ML IV SOLN
4.0000 mg | Freq: Once | INTRAVENOUS | Status: AC
  Administered 2015-07-15: 4 mg via INTRAVENOUS

## 2015-07-15 MED ORDER — MORPHINE SULFATE (PF) 4 MG/ML IV SOLN
INTRAVENOUS | Status: AC
  Filled 2015-07-15: qty 1

## 2015-07-15 MED ORDER — SODIUM CHLORIDE 0.9 % IV SOLN
Freq: Once | INTRAVENOUS | Status: AC
  Administered 2015-07-15: 150 mL/h via INTRAVENOUS

## 2015-07-15 MED ORDER — SODIUM CHLORIDE 0.9 % IV BOLUS (SEPSIS)
500.0000 mL | Freq: Once | INTRAVENOUS | Status: AC
  Administered 2015-07-15: 500 mL via INTRAVENOUS

## 2015-07-15 MED ORDER — ONDANSETRON HCL 4 MG/2ML IJ SOLN
4.0000 mg | Freq: Once | INTRAMUSCULAR | Status: AC
  Administered 2015-07-15: 4 mg via INTRAVENOUS
  Filled 2015-07-15: qty 2

## 2015-07-15 NOTE — ED Provider Notes (Signed)
[facility] Emergency Department Provider Note  ____________________________________________  Time seen: Approximately 8:13 PM  I have reviewed the triage vital signs and the nursing notes.   HISTORY  Chief Complaint Emesis    HPI Kimberly Montgomery is a 50 y.o. female history of end-stage renal disease, on peritoneal dialysis, coronary disease and a recent evaluation manage medically for acute coronary syndrome/chest pain.  Patient reports that after leaving the hospital, she began to develop nausea and vomiting several times and had loose watery stools up so 3-4 times a day. She reports she's been having increasing and crampy abdominal pain. No fevers or chills. She does feel little "dehydrated". She's been able to do her home peritoneal dialysis without issue and has not noticed any changes with that.  She denies any chest pain at present, she does have associated moderate nausea however.  She does not produce urine frequently. She has not had any pain or burning with urination. She denies any recent antibiotic use but was hospitalized for several days.  Describes a moderate crampy abdominal discomfort.   Past Medical History  Diagnosis Date  . Renal disorder   . Hypertension   . CHF (congestive heart failure) (HCC)   . Diabetes mellitus without complication (HCC)   . Gastroparesis   . Asthma   . ESRD (end stage renal disease) (HCC)   . Endometriosis   . CAD (coronary artery disease)   . HLD (hyperlipidemia)   . GERD (gastroesophageal reflux disease)     Patient Active Problem List   Diagnosis Date Noted  . Unstable angina (HCC) 07/09/2015  . ESRD on peritoneal dialysis (HCC) 07/09/2015  . Accelerated hypertension 07/09/2015  . Type 2 diabetes mellitus (HCC) 07/09/2015  . CAD (coronary artery disease) 07/09/2015  . HLD (hyperlipidemia) 07/09/2015  . GERD (gastroesophageal reflux disease) 07/09/2015  . Chest pain 05/12/2015    Past Surgical  History  Procedure Laterality Date  . Hysterotomy    . Below knee leg amputation    . Pd tube inserstion    . Hd fistula surgery with reversal    . Cardiac catheterization Right 07/10/2015    Procedure: Left Heart Cath and Coronary Angiography;  Surgeon: Laurier Nancy, MD;  Location: [facility] INVASIVE CV LAB;  Service: Cardiovascular;  Laterality: Right;    Current Outpatient Rx  Name  Route  Sig  Dispense  Refill  . acetaminophen (TYLENOL) 500 MG tablet   Oral   Take 1,000 mg by mouth every 6 (six) hours as needed for mild pain.          [name] albuterol (PROVENTIL HFA;VENTOLIN HFA) 108 (90 Base) MCG/ACT inhaler   Inhalation   Inhale 2 puffs into the lungs every 6 (six) hours as needed for wheezing or shortness of breath.         [name] amLODipine (NORVASC) 10 MG tablet   Oral   Take 10 mg by mouth daily.         [name] aspirin EC 81 MG tablet   Oral   Take 81 mg by mouth daily.         [name] aspirin-acetaminophen-caffeine (EXCEDRIN MIGRAINE) 250-250-65 MG tablet   Oral   Take 2 tablets by mouth every 6 (six) hours as needed for headache.         [name] buPROPion (WELLBUTRIN SR) 150 MG 12 hr tablet   Oral   Take 150 mg by mouth daily.         . calcium  acetate, Phos Binder, (PHOSLYRA) 667 MG/5ML SOLN   Oral   Take 2,668 mg by mouth 3 (three) times daily with meals.          . carvedilol (COREG) 6.25 MG tablet   Oral   Take 6.25 mg by mouth 2 (two) times daily with a meal.         . clopidogrel (PLAVIX) 75 MG tablet   Oral   Take 75 mg by mouth daily.         [name] docusate sodium (COLACE) 100 MG capsule   Oral   Take 100 mg by mouth at bedtime as needed for mild constipation.         [name] ezetimibe-simvastatin (VYTORIN) 10-40 MG tablet   Oral   Take 1 tablet by mouth at bedtime.         . gabapentin (NEURONTIN) 600 MG tablet   Oral   Take 600 mg by mouth 3 (three) times daily.      5   . insulin aspart (NOVOLOG) 100 UNIT/ML injection   Subcutaneous   Inject 15  Units into the skin 3 (three) times daily with meals.         . insulin glargine (LANTUS) 100 UNIT/ML injection   Subcutaneous   Inject 65 Units into the skin at bedtime.         . isosorbide mononitrate (IMDUR) 60 MG 24 hr tablet   Oral   Take 1 tablet (60 mg total) by mouth daily.   30 tablet   00   . lisinopril (PRINIVIL,ZESTRIL) 40 MG tablet   Oral   Take 40 mg by mouth daily.      3   . nitroGLYCERIN (NITROSTAT) 0.4 MG SL tablet   Sublingual   Place 0.4 mg under the tongue every 5 (five) minutes as needed for chest pain. Reported on 07/10/2015         . omeprazole (PRILOSEC) 40 MG capsule   Oral   Take 40 mg by mouth 2 (two) times daily.         . polyethylene glycol (MIRALAX / GLYCOLAX) packet   Oral   Take 17 g by mouth daily as needed for mild constipation.         . ranolazine (RANEXA) 500 MG 12 hr tablet   Oral   Take 1 tablet (500 mg total) by mouth 2 (two) times daily.   60 tablet   0   . torsemide (DEMADEX) 100 MG tablet   Oral   Take 100 mg by mouth daily.         . ondansetron (ZOFRAN ODT) 4 MG disintegrating tablet   Oral   Take 1 tablet (4 mg total) by mouth every 6 (six) hours as needed for nausea or vomiting.   20 tablet   0     Allergies Cephalosporins; Penicillins; and Phenergan  Family History  Problem Relation Age of Onset  . CAD    . Diabetes    . Bipolar disorder      Social History Social History  Substance Use Topics  . Smoking status: Current Every Day Smoker -- 0.50 packs/day    Types: Cigarettes  . Smokeless tobacco: None  . Alcohol Use: No    Review of Systems Constitutional: No fever/chills Eyes: No visual changes. ENT: No sore throat. Cardiovascular: Denies chest pain. Respiratory: Denies shortness of breath. Gastrointestinal: No constipation.No black or bloody diarrhea. Has vomited several times, states whenever she tries to eat  it just comes right back up. No black or blood in the  vomit. Genitourinary: Negative for dysuria. Musculoskeletal: Negative for back pain. Skin: Negative for rash. Neurological: Negative for headaches, focal weakness or numbness.  10-point ROS otherwise negative.  ____________________________________________   PHYSICAL EXAM:  VITAL SIGNS: ED Triage Vitals  Enc Vitals Group     BP 07/15/15 1634 160/82 mmHg     Pulse Rate 07/15/15 1634 92     Resp 07/15/15 1634 20     Temp 07/15/15 1634 98.2 F (36.8 C)     Temp src --      SpO2 07/15/15 1634 98 %     Weight 07/15/15 1634 260 lb (117.935 kg)     Height 07/15/15 1634  (1.753 m)     Head Cir --      Peak Flow --      Pain Score 07/15/15 1636 6     Pain Loc --      Pain Edu? --      Excl. in GC? --    Constitutional: Alert and oriented. Well appearing and in no acute distress. Very pleasant. Eyes: Conjunctivae are normal. PERRL. EOMI. Head: Atraumatic. Nose: No congestion/rhinnorhea. Mouth/Throat: Mucous membranes are dry.  Oropharynx non-erythematous. Neck: No stridor.   Cardiovascular: Normal rate, regular rhythm. Grossly normal heart sounds.  Good peripheral circulation. Respiratory: Normal respiratory effort.  No retractions. Lungs CTAB. Gastrointestinal: Soft and mildly tender throughout, no frank peritonitis noted. No significant focality on exam. She is can complains of mild to moderate tenderness throughout all quadrants.. No distention. No abdominal bruits. No CVA tenderness. Peritoneal dialysis site clean dry intact. Musculoskeletal: Right lower extremity artificial limb. Left lower extremity minimal edema at the ankle, no lesion noted.  No joint effusions. Neurologic:  Normal speech and language. No gross focal neurologic deficits are appreciated. Skin:  Skin is warm, dry and intact. No rash noted. Psychiatric: Mood and affect are normal. Speech and behavior are normal.  ____________________________________________   LABS (all labs ordered are listed, but only  abnormal results are displayed)  Labs Reviewed  COMPREHENSIVE METABOLIC PANEL - Abnormal; Notable for the following:    Chloride 100 (*)    CO2 20 (*)    Glucose, Bld 155 (*)    BUN 92 (*)    Creatinine, Ser 10.47 (*)    Calcium 6.2 (*)    Total Protein 5.8 (*)    Albumin 2.7 (*)    ALT 13 (*)    GFR calc non Af Amer 4 (*)    GFR calc Af Amer 4 (*)    Anion gap 16 (*)    All other components within normal limits  CBC - Abnormal; Notable for the following:    RBC 3.79 (*)    Hemoglobin 10.7 (*)    HCT 31.8 (*)    All other components within normal limits  URINALYSIS COMPLETEWITH MICROSCOPIC ([facility] ONLY) - Abnormal; Notable for the following:    Color, Urine YELLOW (*)    APPearance CLEAR (*)    Glucose, UA >500 (*)    Ketones, ur TRACE (*)    Hgb urine dipstick 1+ (*)    Protein, ur >500 (*)    Bacteria, UA MANY (*)    Squamous Epithelial / LPF 0-5 (*)    All other components within normal limits  TROPONIN I - Abnormal; Notable for the following:    Troponin I 0.05 (*)    All other components within normal  limits  GRAM STAIN  BODY FLUID CULTURE  GASTROINTESTINAL PANEL BY PCR, STOOL (REPLACES STOOL CULTURE)  C DIFFICILE QUICK SCREEN W PCR REFLEX  LIPASE, BLOOD  BODY FLUID CELL COUNT WITH DIFFERENTIAL   ____________________________________________  EKG  Reviewed and to revive me at 2040 Heart rate 90 QRS 110 QTc 470 Normal sinus rhythm, consider possible old anterior infarct. No evidence of acute ST abnormality or ischemic change. ____________________________________________  RADIOLOGY  CT Abdomen Pelvis Wo Contrast (Final result) Result time: 07/15/15 23:51:49   Final result by Rad Results In Interface (07/15/15 23:51:49)   Narrative:   CLINICAL DATA: Nausea, vomiting and diarrhea with abdominal pain for 4 days. End-stage renal disease.  EXAM: CT ABDOMEN AND PELVIS WITHOUT CONTRAST  TECHNIQUE: Multidetector CT imaging of the abdomen and pelvis was  performed following the standard protocol without IV contrast.  COMPARISON: 11/13/2013 CT abdomen/pelvis.  FINDINGS: Lower chest: No significant pulmonary nodules or acute consolidative airspace disease. Coronary atherosclerosis. Coarse mitral annular calcification.  Hepatobiliary: Normal liver with no liver mass. Gallbladder is filled with high density material. No gallbladder wall thickening or pericholecystic fluid. No biliary ductal dilatation.  Pancreas: Normal, with no mass or duct dilation.  Spleen: Normal size. No mass.  Adrenals/Urinary Tract: Normal adrenals. Calcifications in the bilateral renal sinuses are favored to represent vascular calcifications. No hydronephrosis. No contour deforming renal masses. The normal caliber ureters, with no ureteral stones. Normal bladder.  Stomach/Bowel: Small hiatal hernia. Otherwise grossly normal stomach. Normal caliber small bowel with no small bowel wall thickening. Normal appendix Normal large bowel with no diverticulosis, large bowel wall thickening or pericolonic fat stranding.  Vascular/Lymphatic: Atherosclerotic nonaneurysmal abdominal aorta. No pathologically enlarged lymph nodes in the abdomen or pelvis.  Reproductive: There is a 3.6 x 2.2 cm solid left adnexal mass (series 2/ image 71), stable since 11/13/2013 CT study, which could represent an asymmetrically prominent left ovary or less likely an exophytic left fundal uterine fibroid. No additional adnexal masses.  Other: No pneumoperitoneum, ascites or focal fluid collection. Ventral right lower abdominal approach percutaneous catheter terminates in the anterior left upper pelvis.  Musculoskeletal: No aggressive appearing focal osseous lesions. Moderate degenerative changes in the visualized thoracolumbar spine. Prominent enlarged Schmorl's nodes at L3-4. Mild anasarca.  IMPRESSION: 1. No acute abnormality. No evidence of bowel obstruction or acute bowel  inflammation. Normal appendix. 2. Gallbladder is filled with nonspecific high density material, which could represent vicarious excretion of IV contrast if the patient has received a recent dose of IV contrast, or less likely could represent sludge, stones or hemorrhage. No CT findings to suggest acute cholecystitis. Correlate with right upper quadrant abdominal sonography as clinically warranted. 3. Chronic solid left adnexal mass, stable since 2015, differential includes asymmetrically prominent left ovary or exophytic left fundal fibroid, correlate with pelvic sonography on a short term outpatient basis as clinically warranted. 4. Worsened degenerative disc disease at L3-4. 5. Additional findings include coronary atherosclerosis and small hiatal hernia.   Electronically Signed By: Delbert Phenix M.D. On: 07/15/2015 23:51      Discussed results with patient. She is not having any persistent pain in the right upper abdomen. The present time only mild tenderness throughout on exam on exam with no focality right upper quadrant. She is also aware of the adnexal mass and is currently following with gynecology for this. ____________________________________________   PROCEDURES  Procedure(s) performed: None  Critical Care performed: No  ____________________________________________   INITIAL IMPRESSION / ASSESSMENT AND PLAN / ED COURSE  Pertinent labs & imaging results that were available during my care of the patient were reviewed by me and considered in my medical decision making (see chart for details).  Case discussed with Dr. Thedore Mins of nephrology. We will send. fluid for evaluation to rule out peritonitis. Her primary impression seems to be a gastrointestinal illness, possibly infectious viral versus bacterial. She was recently hospitalized, so if she is able to provide a sample we will send for culture. She does have tenderness throughout the abdomen, and given her dialysis  we will give her a gentle fluid bolus, Zofran, and pain control. I do plan to obtain a CT of the abdomen and pelvis to further evaluate for etiology. We will not give IV contrast due to her dialysis occasional slight urination not wanting to harm her kidneys further.  She is not complaining acute cardiac or pulmonary symptoms. Given her history and intermittent remitting nausea I will send a troponin and EKG.  ----------------------------------------- 12:22 AM on 07/16/2015 -----------------------------------------  Patient able to tolerate keeping contrast down. She is reporting return of some mild discomfort and nausea at this time. We'll give additional Zofran and morphine. Overall CT scan and labs look quite reassuring, and she is nontoxic and stable appearing. Plan of care assigned to Dr. Dolores Frame. Follow-up on peritoneal fluid analysis, plan to assure no evidence for peritonitis. No evidence for peritonitis or infection within peritoneal fluid, likely discharge to home with close follow-up with primary care and prescriptions for antiemetic. ____________________________________________   FINAL CLINICAL IMPRESSION(S) / ED DIAGNOSES  Final diagnoses:  Vomiting and diarrhea      Sharyn Creamer, MD 07/16/15 [phone]

## 2015-07-15 NOTE — ED Notes (Signed)
Pt states was discharged from the hospital on weds and vomiting since and unable to take any meds

## 2015-07-16 ENCOUNTER — Encounter: Payer: Self-pay | Admitting: Internal Medicine

## 2015-07-16 DIAGNOSIS — N2581 Secondary hyperparathyroidism of renal origin: Secondary | ICD-10-CM | POA: Diagnosis present

## 2015-07-16 DIAGNOSIS — E871 Hypo-osmolality and hyponatremia: Secondary | ICD-10-CM | POA: Diagnosis present

## 2015-07-16 DIAGNOSIS — G40409 Other generalized epilepsy and epileptic syndromes, not intractable, without status epilepticus: Secondary | ICD-10-CM | POA: Diagnosis present

## 2015-07-16 DIAGNOSIS — Z79899 Other long term (current) drug therapy: Secondary | ICD-10-CM | POA: Diagnosis not present

## 2015-07-16 DIAGNOSIS — K650 Generalized (acute) peritonitis: Secondary | ICD-10-CM | POA: Diagnosis present

## 2015-07-16 DIAGNOSIS — G253 Myoclonus: Secondary | ICD-10-CM | POA: Diagnosis not present

## 2015-07-16 DIAGNOSIS — Z880 Allergy status to penicillin: Secondary | ICD-10-CM | POA: Diagnosis not present

## 2015-07-16 DIAGNOSIS — Z9889 Other specified postprocedural states: Secondary | ICD-10-CM | POA: Diagnosis not present

## 2015-07-16 DIAGNOSIS — K652 Spontaneous bacterial peritonitis: Secondary | ICD-10-CM | POA: Diagnosis present

## 2015-07-16 DIAGNOSIS — I2510 Atherosclerotic heart disease of native coronary artery without angina pectoris: Secondary | ICD-10-CM | POA: Diagnosis present

## 2015-07-16 DIAGNOSIS — D631 Anemia in chronic kidney disease: Secondary | ICD-10-CM | POA: Diagnosis present

## 2015-07-16 DIAGNOSIS — R0682 Tachypnea, not elsewhere classified: Secondary | ICD-10-CM | POA: Diagnosis present

## 2015-07-16 DIAGNOSIS — Z89611 Acquired absence of right leg above knee: Secondary | ICD-10-CM | POA: Diagnosis not present

## 2015-07-16 DIAGNOSIS — L97529 Non-pressure chronic ulcer of other part of left foot with unspecified severity: Secondary | ICD-10-CM | POA: Diagnosis present

## 2015-07-16 DIAGNOSIS — F17210 Nicotine dependence, cigarettes, uncomplicated: Secondary | ICD-10-CM | POA: Diagnosis present

## 2015-07-16 DIAGNOSIS — E1143 Type 2 diabetes mellitus with diabetic autonomic (poly)neuropathy: Secondary | ICD-10-CM | POA: Diagnosis present

## 2015-07-16 DIAGNOSIS — Z8049 Family history of malignant neoplasm of other genital organs: Secondary | ICD-10-CM | POA: Diagnosis not present

## 2015-07-16 DIAGNOSIS — M5136 Other intervertebral disc degeneration, lumbar region: Secondary | ICD-10-CM | POA: Diagnosis present

## 2015-07-16 DIAGNOSIS — D72825 Bandemia: Secondary | ICD-10-CM | POA: Diagnosis present

## 2015-07-16 DIAGNOSIS — T426X5A Adverse effect of other antiepileptic and sedative-hypnotic drugs, initial encounter: Secondary | ICD-10-CM | POA: Diagnosis present

## 2015-07-16 DIAGNOSIS — R748 Abnormal levels of other serum enzymes: Secondary | ICD-10-CM | POA: Diagnosis present

## 2015-07-16 DIAGNOSIS — Z794 Long term (current) use of insulin: Secondary | ICD-10-CM | POA: Diagnosis not present

## 2015-07-16 DIAGNOSIS — K659 Peritonitis, unspecified: Secondary | ICD-10-CM | POA: Diagnosis present

## 2015-07-16 DIAGNOSIS — J45909 Unspecified asthma, uncomplicated: Secondary | ICD-10-CM | POA: Diagnosis present

## 2015-07-16 DIAGNOSIS — Z923 Personal history of irradiation: Secondary | ICD-10-CM | POA: Diagnosis not present

## 2015-07-16 DIAGNOSIS — Z90710 Acquired absence of both cervix and uterus: Secondary | ICD-10-CM | POA: Diagnosis not present

## 2015-07-16 DIAGNOSIS — R569 Unspecified convulsions: Secondary | ICD-10-CM | POA: Diagnosis not present

## 2015-07-16 DIAGNOSIS — Z6841 Body Mass Index (BMI) 40.0 and over, adult: Secondary | ICD-10-CM | POA: Diagnosis not present

## 2015-07-16 DIAGNOSIS — Z992 Dependence on renal dialysis: Secondary | ICD-10-CM | POA: Diagnosis not present

## 2015-07-16 DIAGNOSIS — I5032 Chronic diastolic (congestive) heart failure: Secondary | ICD-10-CM | POA: Diagnosis present

## 2015-07-16 DIAGNOSIS — Z5320 Procedure and treatment not carried out because of patient's decision for unspecified reasons: Secondary | ICD-10-CM | POA: Diagnosis present

## 2015-07-16 DIAGNOSIS — L03116 Cellulitis of left lower limb: Secondary | ICD-10-CM | POA: Diagnosis present

## 2015-07-16 DIAGNOSIS — Z888 Allergy status to other drugs, medicaments and biological substances status: Secondary | ICD-10-CM | POA: Diagnosis not present

## 2015-07-16 DIAGNOSIS — B369 Superficial mycosis, unspecified: Secondary | ICD-10-CM | POA: Diagnosis present

## 2015-07-16 DIAGNOSIS — E1122 Type 2 diabetes mellitus with diabetic chronic kidney disease: Secondary | ICD-10-CM | POA: Diagnosis present

## 2015-07-16 DIAGNOSIS — Z833 Family history of diabetes mellitus: Secondary | ICD-10-CM | POA: Diagnosis not present

## 2015-07-16 DIAGNOSIS — E11621 Type 2 diabetes mellitus with foot ulcer: Secondary | ICD-10-CM | POA: Diagnosis present

## 2015-07-16 DIAGNOSIS — K567 Ileus, unspecified: Secondary | ICD-10-CM | POA: Diagnosis not present

## 2015-07-16 DIAGNOSIS — Z818 Family history of other mental and behavioral disorders: Secondary | ICD-10-CM | POA: Diagnosis not present

## 2015-07-16 DIAGNOSIS — K3184 Gastroparesis: Secondary | ICD-10-CM | POA: Diagnosis present

## 2015-07-16 DIAGNOSIS — K449 Diaphragmatic hernia without obstruction or gangrene: Secondary | ICD-10-CM | POA: Diagnosis present

## 2015-07-16 DIAGNOSIS — N186 End stage renal disease: Secondary | ICD-10-CM | POA: Diagnosis not present

## 2015-07-16 DIAGNOSIS — Z7982 Long term (current) use of aspirin: Secondary | ICD-10-CM | POA: Diagnosis not present

## 2015-07-16 DIAGNOSIS — F319 Bipolar disorder, unspecified: Secondary | ICD-10-CM | POA: Diagnosis present

## 2015-07-16 DIAGNOSIS — M47816 Spondylosis without myelopathy or radiculopathy, lumbar region: Secondary | ICD-10-CM | POA: Diagnosis present

## 2015-07-16 DIAGNOSIS — E1144 Type 2 diabetes mellitus with diabetic amyotrophy: Secondary | ICD-10-CM | POA: Diagnosis present

## 2015-07-16 DIAGNOSIS — E039 Hypothyroidism, unspecified: Secondary | ICD-10-CM | POA: Diagnosis present

## 2015-07-16 DIAGNOSIS — Z8249 Family history of ischemic heart disease and other diseases of the circulatory system: Secondary | ICD-10-CM | POA: Diagnosis not present

## 2015-07-16 DIAGNOSIS — I132 Hypertensive heart and chronic kidney disease with heart failure and with stage 5 chronic kidney disease, or end stage renal disease: Secondary | ICD-10-CM | POA: Diagnosis present

## 2015-07-16 DIAGNOSIS — E1159 Type 2 diabetes mellitus with other circulatory complications: Secondary | ICD-10-CM | POA: Diagnosis present

## 2015-07-16 DIAGNOSIS — K219 Gastro-esophageal reflux disease without esophagitis: Secondary | ICD-10-CM | POA: Diagnosis present

## 2015-07-16 LAB — CBC
HEMATOCRIT: 29 % — AB (ref 35.0–47.0)
HEMOGLOBIN: 9.4 g/dL — AB (ref 12.0–16.0)
MCH: 27.1 pg (ref 26.0–34.0)
MCHC: 32.3 g/dL (ref 32.0–36.0)
MCV: 83.8 fL (ref 80.0–100.0)
Platelets: 322 10*3/uL (ref 150–440)
RBC: 3.46 MIL/uL — ABNORMAL LOW (ref 3.80–5.20)
RDW: 14.6 % — AB (ref 11.5–14.5)
WBC: 10.8 10*3/uL (ref 3.6–11.0)

## 2015-07-16 LAB — BODY FLUID CELL COUNT WITH DIFFERENTIAL
Eos, Fluid: 0 %
Lymphs, Fluid: 16 %
MONOCYTE-MACROPHAGE-SEROUS FLUID: 11 %
NEUTROPHIL FLUID: 73 %
Other Cells, Fluid: 0 %
WBC FLUID: 690 uL

## 2015-07-16 LAB — CREATININE, SERUM
Creatinine, Ser: 9.44 mg/dL — ABNORMAL HIGH (ref 0.44–1.00)
GFR calc Af Amer: 5 mL/min — ABNORMAL LOW (ref >60)
GFR, EST NON AFRICAN AMERICAN: 4 mL/min — AB (ref >60)

## 2015-07-16 LAB — GLUCOSE, CAPILLARY: GLUCOSE-CAPILLARY: 185 mg/dL — AB (ref 65–99)

## 2015-07-16 MED ORDER — HEPARIN SODIUM (PORCINE) 5000 UNIT/ML IJ SOLN
5000.0000 [IU] | Freq: Three times a day (TID) | INTRAMUSCULAR | Status: DC
  Administered 2015-07-16 – 2015-08-01 (×45): 5000 [IU] via SUBCUTANEOUS
  Filled 2015-07-16 (×44): qty 1

## 2015-07-16 MED ORDER — EZETIMIBE 10 MG PO TABS
10.0000 mg | ORAL_TABLET | Freq: Every day | ORAL | Status: DC
  Administered 2015-07-17 – 2015-07-26 (×10): 10 mg via ORAL
  Filled 2015-07-16 (×12): qty 1

## 2015-07-16 MED ORDER — PROCHLORPERAZINE EDISYLATE 5 MG/ML IJ SOLN
5.0000 mg | Freq: Four times a day (QID) | INTRAMUSCULAR | Status: DC | PRN
  Administered 2015-07-16 – 2015-07-31 (×15): 5 mg via INTRAVENOUS
  Filled 2015-07-16 (×19): qty 1

## 2015-07-16 MED ORDER — SODIUM CHLORIDE 0.9 % IV SOLN: 250.0000 mL | INTRAVENOUS | Status: DC | PRN

## 2015-07-16 MED ORDER — RANOLAZINE ER 500 MG PO TB12
500.0000 mg | ORAL_TABLET | Freq: Two times a day (BID) | ORAL | Status: DC
  Administered 2015-07-17 – 2015-07-31 (×21): 500 mg via ORAL
  Filled 2015-07-16 (×27): qty 1

## 2015-07-16 MED ORDER — LEVOFLOXACIN IN D5W 750 MG/150ML IV SOLN
750.0000 mg | Freq: Once | INTRAVENOUS | Status: AC
  Administered 2015-07-16: 750 mg via INTRAVENOUS
  Filled 2015-07-16: qty 150

## 2015-07-16 MED ORDER — ONDANSETRON HCL 4 MG PO TABS
4.0000 mg | ORAL_TABLET | Freq: Four times a day (QID) | ORAL | Status: DC | PRN
  Administered 2015-07-20: 4 mg via ORAL
  Filled 2015-07-16 (×2): qty 1

## 2015-07-16 MED ORDER — ASPIRIN EC 81 MG PO TBEC
81.0000 mg | DELAYED_RELEASE_TABLET | Freq: Every day | ORAL | Status: DC
  Administered 2015-07-17 – 2015-07-30 (×11): 81 mg via ORAL
  Filled 2015-07-16 (×15): qty 1

## 2015-07-16 MED ORDER — POLYETHYLENE GLYCOL 3350 17 G PO PACK
17.0000 g | PACK | Freq: Every day | ORAL | Status: DC | PRN
  Administered 2015-07-23: 17 g via ORAL
  Filled 2015-07-16: qty 1

## 2015-07-16 MED ORDER — HEPARIN 1000 UNIT/ML FOR PERITONEAL DIALYSIS
500.0000 [IU] | INTRAMUSCULAR | Status: DC | PRN
  Filled 2015-07-16: qty 0.5

## 2015-07-16 MED ORDER — ACETAMINOPHEN 650 MG RE SUPP: 650.0000 mg | Freq: Four times a day (QID) | RECTAL | Status: DC | PRN

## 2015-07-16 MED ORDER — ISOSORBIDE MONONITRATE ER 60 MG PO TB24
60.0000 mg | ORAL_TABLET | Freq: Every day | ORAL | Status: DC
  Administered 2015-07-17 – 2015-07-31 (×9): 60 mg via ORAL
  Filled 2015-07-16 (×15): qty 1

## 2015-07-16 MED ORDER — ALBUTEROL SULFATE HFA 108 (90 BASE) MCG/ACT IN AERS: 2.0000 | INHALATION_SPRAY | Freq: Four times a day (QID) | RESPIRATORY_TRACT | Status: DC | PRN

## 2015-07-16 MED ORDER — CALCIUM ACETATE (PHOS BINDER) 667 MG PO CAPS: 2668.0000 mg | ORAL_CAPSULE | Freq: Three times a day (TID) | ORAL | Status: DC

## 2015-07-16 MED ORDER — SODIUM CHLORIDE 0.9 % IV SOLN
INTRAVENOUS | Status: DC
  Administered 2015-07-16: 07:00:00 via INTRAVENOUS

## 2015-07-16 MED ORDER — BUPROPION HCL ER (SR) 150 MG PO TB12
150.0000 mg | ORAL_TABLET | Freq: Every day | ORAL | Status: DC
Start: 2015-07-16 — End: 2015-07-20
  Administered 2015-07-17 – 2015-07-20 (×4): 150 mg via ORAL
  Filled 2015-07-16 (×5): qty 1

## 2015-07-16 MED ORDER — MORPHINE SULFATE (PF) 2 MG/ML IV SOLN
2.0000 mg | INTRAVENOUS | Status: DC | PRN
  Administered 2015-07-16 – 2015-07-19 (×9): 2 mg via INTRAVENOUS
  Filled 2015-07-16 (×9): qty 1

## 2015-07-16 MED ORDER — ACETAMINOPHEN 325 MG PO TABS
650.0000 mg | ORAL_TABLET | Freq: Four times a day (QID) | ORAL | Status: DC | PRN
Start: 2015-07-16 — End: 2015-08-02
  Administered 2015-07-20 – 2015-07-24 (×3): 650 mg via ORAL
  Filled 2015-07-16 (×3): qty 2

## 2015-07-16 MED ORDER — LEVOFLOXACIN IN D5W 500 MG/100ML IV SOLN
500.0000 mg | INTRAVENOUS | Status: DC
  Administered 2015-07-17: 500 mg via INTRAVENOUS
  Filled 2015-07-16: qty 100

## 2015-07-16 MED ORDER — AMLODIPINE BESYLATE 10 MG PO TABS
10.0000 mg | ORAL_TABLET | Freq: Every day | ORAL | Status: DC
  Administered 2015-07-17 – 2015-07-23 (×5): 10 mg via ORAL
  Filled 2015-07-16 (×9): qty 1

## 2015-07-16 MED ORDER — ONDANSETRON HCL 4 MG/2ML IJ SOLN
4.0000 mg | Freq: Once | INTRAMUSCULAR | Status: AC
  Administered 2015-07-16: 4 mg via INTRAVENOUS
  Filled 2015-07-16: qty 2

## 2015-07-16 MED ORDER — PANTOPRAZOLE SODIUM 40 MG PO TBEC
40.0000 mg | DELAYED_RELEASE_TABLET | Freq: Every day | ORAL | Status: DC
  Administered 2015-07-17: 40 mg via ORAL
  Filled 2015-07-16 (×2): qty 1

## 2015-07-16 MED ORDER — CARVEDILOL 6.25 MG PO TABS
6.2500 mg | ORAL_TABLET | Freq: Two times a day (BID) | ORAL | Status: DC
  Administered 2015-07-16 – 2015-08-01 (×23): 6.25 mg via ORAL
  Filled 2015-07-16 (×28): qty 1

## 2015-07-16 MED ORDER — LEVOFLOXACIN IN D5W 500 MG/100ML IV SOLN: 500.0000 mg | INTRAVENOUS | Status: DC

## 2015-07-16 MED ORDER — SODIUM CHLORIDE 0.9% FLUSH
3.0000 mL | Freq: Two times a day (BID) | INTRAVENOUS | Status: DC
  Administered 2015-07-16 – 2015-08-01 (×31): 3 mL via INTRAVENOUS

## 2015-07-16 MED ORDER — CLOPIDOGREL BISULFATE 75 MG PO TABS
75.0000 mg | ORAL_TABLET | Freq: Every day | ORAL | Status: DC
  Administered 2015-07-17 – 2015-08-01 (×13): 75 mg via ORAL
  Filled 2015-07-16 (×15): qty 1

## 2015-07-16 MED ORDER — TORSEMIDE 100 MG PO TABS
100.0000 mg | ORAL_TABLET | Freq: Every day | ORAL | Status: DC
  Filled 2015-07-16: qty 1

## 2015-07-16 MED ORDER — ONDANSETRON HCL 4 MG/2ML IJ SOLN
4.0000 mg | Freq: Four times a day (QID) | INTRAMUSCULAR | Status: DC | PRN
  Administered 2015-07-16 – 2015-07-21 (×4): 4 mg via INTRAVENOUS
  Filled 2015-07-16 (×5): qty 2

## 2015-07-16 MED ORDER — SODIUM CHLORIDE 0.9% FLUSH: 3.0000 mL | Freq: Two times a day (BID) | INTRAVENOUS | Status: DC

## 2015-07-16 MED ORDER — VANCOMYCIN HCL IN DEXTROSE 1-5 GM/200ML-% IV SOLN: 1000.0000 mg | INTRAVENOUS | Status: DC

## 2015-07-16 MED ORDER — EZETIMIBE-SIMVASTATIN 10-40 MG PO TABS
1.0000 | ORAL_TABLET | Freq: Every day | ORAL | Status: DC
Start: 2015-07-16 — End: 2015-07-16

## 2015-07-16 MED ORDER — GENTAMICIN SULFATE 0.1 % EX CREA
1.0000 "application " | TOPICAL_CREAM | Freq: Every day | CUTANEOUS | Status: DC
  Administered 2015-07-18 – 2015-08-01 (×14): 1 via TOPICAL
  Filled 2015-07-16: qty 15

## 2015-07-16 MED ORDER — LISINOPRIL 20 MG PO TABS
40.0000 mg | ORAL_TABLET | Freq: Every day | ORAL | Status: DC
  Administered 2015-07-17 – 2015-07-23 (×5): 40 mg via ORAL
  Filled 2015-07-16 (×8): qty 2

## 2015-07-16 MED ORDER — SODIUM CHLORIDE 0.9 % IV SOLN
2500.0000 mg | Freq: Once | INTRAVENOUS | Status: AC
  Administered 2015-07-16: 2500 mg via INTRAVENOUS
  Filled 2015-07-16: qty 2500

## 2015-07-16 MED ORDER — MORPHINE SULFATE (PF) 4 MG/ML IV SOLN
4.0000 mg | Freq: Once | INTRAVENOUS | Status: AC
  Administered 2015-07-16: 4 mg via INTRAVENOUS
  Filled 2015-07-16: qty 1

## 2015-07-16 MED ORDER — SODIUM CHLORIDE 0.9% FLUSH
3.0000 mL | INTRAVENOUS | Status: DC | PRN
  Administered 2015-07-17 – 2015-07-19 (×4): 3 mL via INTRAVENOUS
  Filled 2015-07-16 (×4): qty 3

## 2015-07-16 MED ORDER — DOCUSATE SODIUM 100 MG PO CAPS
100.0000 mg | ORAL_CAPSULE | Freq: Every evening | ORAL | Status: DC | PRN
  Administered 2015-07-23: 100 mg via ORAL
  Filled 2015-07-16 (×5): qty 1

## 2015-07-16 MED ORDER — SIMVASTATIN 40 MG PO TABS: 40.0000 mg | ORAL_TABLET | Freq: Every day | ORAL | Status: DC

## 2015-07-16 MED ORDER — GABAPENTIN 600 MG PO TABS
600.0000 mg | ORAL_TABLET | Freq: Three times a day (TID) | ORAL | Status: DC
  Administered 2015-07-16 – 2015-07-19 (×8): 600 mg via ORAL
  Filled 2015-07-16 (×9): qty 1

## 2015-07-16 MED ORDER — DELFLEX-LC/2.5% DEXTROSE 394 MOSM/L IP SOLN
INTRAPERITONEAL | Status: DC
  Administered 2015-07-16: 7200 mL via INTRAPERITONEAL
  Administered 2015-07-18 – 2015-07-22 (×3): via INTRAPERITONEAL
  Administered 2015-07-22: 6 L via INTRAPERITONEAL
  Administered 2015-07-23: 3 L via INTRAPERITONEAL
  Administered 2015-07-24 – 2015-07-27 (×2): via INTRAPERITONEAL
  Administered 2015-07-28: 12 L via INTRAPERITONEAL
  Filled 2015-07-16 (×9): qty 3000

## 2015-07-16 MED ORDER — ALBUTEROL SULFATE (2.5 MG/3ML) 0.083% IN NEBU: 2.5000 mg | INHALATION_SOLUTION | Freq: Four times a day (QID) | RESPIRATORY_TRACT | Status: DC | PRN

## 2015-07-16 MED ORDER — ONDANSETRON 4 MG PO TBDP: 4.0000 mg | ORAL_TABLET | Freq: Four times a day (QID) | ORAL | Status: DC | PRN

## 2015-07-16 NOTE — Care Management Important Message (Signed)
Important Message  Patient Details  Name: Kimberly Montgomery MRN: [id number] Date of Birth: Jan 06, 1966   Medicare Important Message Given:  Yes    Shrita Thien A, RN 07/16/2015, 3:19 PM

## 2015-07-16 NOTE — Progress Notes (Signed)
Pharmacy Antibiotic Note  Kimberly Montgomery is a 50 y.o. female admitted on 07/15/2015 with IAI.  Pharmacy has been consulted for levofloxacin dosing for patient on peritoneal dialysis.   Plan: Levofloxacin 750 mg IV x 1 in ED followed by 500 mg IV Q48H.  Height: 5\' 9"  (175.3 cm) Weight: 260 lb (117.935 kg) IBW/kg (Calculated) : 66.2  Temp (24hrs), Avg:98.2 F (36.8 C), Min:98.2 F (36.8 C), Max:98.2 F (36.8 C)   Recent Labs Lab 07/09/15 2036 07/10/15 0238 07/10/15 0751 07/12/15 0429 07/15/15 1639  WBC 8.9 9.3 9.0 14.3* 9.7  CREATININE 8.37* 8.03* 8.28*  --  10.47*    Estimated Creatinine Clearance: 8.7 mL/min (by C-G formula based on Cr of 10.47).    Allergies  Allergen Reactions  . Cephalosporins Anaphylaxis  . Penicillins Anaphylaxis and Other (See Comments)    Has patient had a PCN reaction causing immediate rash, facial/tongue/throat swelling, SOB or lightheadedness with hypotension: Yes Has patient had a PCN reaction causing severe rash involving mucus membranes or skin necrosis: No Has patient had a PCN reaction that required hospitalization No Has patient had a PCN reaction occurring within the last 10 years: No If all of the above answers are "NO", then may proceed with Cephalosporin use.  [name] Phenergan [Promethazine Hcl] Nausea And Vomiting    Thank you for allowing pharmacy to be a part of this patient's care.  Carola Frost, Pharm.D., BCPS Clinical Pharmacist 07/16/2015 6:05 AM

## 2015-07-16 NOTE — Progress Notes (Addendum)
ANTIBIOTIC CONSULT NOTE - INITIAL  Pharmacy Consult for Vancomycin  Indication: sepsis, peritonitis  Allergies  Allergen Reactions  . Cephalosporins Anaphylaxis  . Penicillins Anaphylaxis and Other (See Comments)    Has patient had a PCN reaction causing immediate rash, facial/tongue/throat swelling, SOB or lightheadedness with hypotension: Yes Has patient had a PCN reaction causing severe rash involving mucus membranes or skin necrosis: No Has patient had a PCN reaction that required hospitalization No Has patient had a PCN reaction occurring within the last 10 years: No If all of the above answers are "NO", then may proceed with Cephalosporin use.  [name] Phenergan [Promethazine Hcl] Nausea And Vomiting    Patient Measurements: Height: 5' 9.02" (175.3 cm) Weight: 261 lb 8 oz (118.616 kg) IBW/kg (Calculated) : 66.24 Adjusted Body Weight: 87.2 kg   Vital Signs: Temp: 97.8 F (36.6 C) (04/16 0751) Temp Source: Oral (04/16 0751) BP: 162/74 mmHg (04/16 0751) Pulse Rate: 87 (04/16 0751) Intake/Output from previous day:   Intake/Output from this shift:    Labs:  Recent Labs  07/15/15 1639 07/16/15 0803  WBC 9.7 10.8  HGB 10.7* 9.4*  PLT 352 322  CREATININE 10.47* 9.44*   Estimated Creatinine Clearance: 9.7 mL/min (by C-G formula based on Cr of 9.44). No results for input(s): VANCOTROUGH, VANCOPEAK, VANCORANDOM, GENTTROUGH, GENTPEAK, GENTRANDOM, TOBRATROUGH, TOBRAPEAK, TOBRARND, AMIKACINPEAK, AMIKACINTROU, AMIKACIN in the last 72 hours.   Microbiology: Recent Results (from the past 720 hour(s))  Body fluid culture     Status: None (Preliminary result)   Collection Time: 07/15/15 10:23 PM  Result Value Ref Range Status   Specimen Description PERITONEAL  Final   Special Requests Normal  Final   Gram Stain PENDING  Incomplete   Culture NO GROWTH < 12 HOURS  Final   Report Status PENDING  Incomplete  Culture, blood (routine x 2)     Status: None (Preliminary result)   Collection Time: 07/16/15  3:58 AM  Result Value Ref Range Status   Specimen Description BLOOD RIGHT WRIST  Final   Special Requests   Final    BOTTLES DRAWN AEROBIC AND ANAEROBIC 12CCAERO,6CCANA   Culture NO GROWTH < 12 HOURS  Final   Report Status PENDING  Incomplete  Culture, blood (routine x 2)     Status: None (Preliminary result)   Collection Time: 07/16/15  3:58 AM  Result Value Ref Range Status   Specimen Description BLOOD RIGHT FOREARM  Final   Special Requests   Final    BOTTLES DRAWN AEROBIC AND ANAEROBIC 12CCAERO,2CCANA   Culture NO GROWTH < 12 HOURS  Final   Report Status PENDING  Incomplete    Medical History: Past Medical History  Diagnosis Date  . Renal disorder   . Hypertension   . CHF (congestive heart failure) (HCC)   . Diabetes mellitus without complication (HCC)   . Gastroparesis   . Asthma   . ESRD (end stage renal disease) (HCC)   . Endometriosis   . CAD (coronary artery disease)   . HLD (hyperlipidemia)   . GERD (gastroesophageal reflux disease)     Medications:  Prescriptions prior to admission  Medication Sig Dispense Refill Last Dose  . acetaminophen (TYLENOL) 500 MG tablet Take 1,000 mg by mouth every 6 (six) hours as needed for mild pain.    07/15/2015 at Unknown time  . albuterol (PROVENTIL HFA;VENTOLIN HFA) 108 (90 Base) MCG/ACT inhaler Inhale 2 puffs into the lungs every 6 (six) hours as needed for wheezing or shortness of breath.  07/15/2015 at Unknown time  . amLODipine (NORVASC) 10 MG tablet Take 10 mg by mouth daily.   07/15/2015 at Unknown time  . aspirin EC 81 MG tablet Take 81 mg by mouth daily.   07/15/2015 at Unknown time  . aspirin-acetaminophen-caffeine (EXCEDRIN MIGRAINE) 250-250-65 MG tablet Take 2 tablets by mouth every 6 (six) hours as needed for headache.   PRN at PRN  . buPROPion (WELLBUTRIN SR) 150 MG 12 hr tablet Take 150 mg by mouth daily.   07/15/2015 at Unknown time  . calcium acetate, Phos Binder, (PHOSLYRA) 667 MG/5ML SOLN  Take 2,668 mg by mouth 3 (three) times daily with meals.    07/15/2015 at Unknown time  . carvedilol (COREG) 6.25 MG tablet Take 6.25 mg by mouth 2 (two) times daily with a meal.   07/15/2015 at Unknown time  . clopidogrel (PLAVIX) 75 MG tablet Take 75 mg by mouth daily.   07/15/2015 at Unknown time  . docusate sodium (COLACE) 100 MG capsule Take 100 mg by mouth at bedtime as needed for mild constipation.   PRN at PRN  . ezetimibe-simvastatin (VYTORIN) 10-40 MG tablet Take 1 tablet by mouth at bedtime.   07/14/2015 at Unknown time  . gabapentin (NEURONTIN) 600 MG tablet Take 600 mg by mouth 3 (three) times daily.  5 07/15/2015 at Unknown time  . insulin aspart (NOVOLOG) 100 UNIT/ML injection Inject 15 Units into the skin 3 (three) times daily with meals.   07/15/2015 at Unknown time  . insulin glargine (LANTUS) 100 UNIT/ML injection Inject 65 Units into the skin at bedtime.   07/14/2015 at Unknown time  . isosorbide mononitrate (IMDUR) 60 MG 24 hr tablet Take 1 tablet (60 mg total) by mouth daily. 30 tablet 00 07/15/2015 at Unknown time  . lisinopril (PRINIVIL,ZESTRIL) 40 MG tablet Take 40 mg by mouth daily.  3 07/15/2015 at Unknown time  . nitroGLYCERIN (NITROSTAT) 0.4 MG SL tablet Place 0.4 mg under the tongue every 5 (five) minutes as needed for chest pain. Reported on 07/10/2015   PRN at PRN  . omeprazole (PRILOSEC) 40 MG capsule Take 40 mg by mouth 2 (two) times daily.   07/15/2015 at Unknown time  . polyethylene glycol (MIRALAX / GLYCOLAX) packet Take 17 g by mouth daily as needed for mild constipation.   PRN at PRN  . ranolazine (RANEXA) 500 MG 12 hr tablet Take 1 tablet (500 mg total) by mouth 2 (two) times daily. 60 tablet 0 07/15/2015 at Unknown time  . torsemide (DEMADEX) 100 MG tablet Take 100 mg by mouth daily.   07/15/2015 at Unknown time   Assessment: Pharmacy consulted to dose vancomycin in this 50 year old female on peritoneal dialysis at home admitted with sepsis, peritonitis.  Pt is basically  anuric.    Goal of Therapy:  random vanc level < 20 mcg/mL   Plan:  Expected duration 10 days with resolution of temperature and/or normalization of WBC  Will give single loading dose of Vancomycin 2500 mg IV X 1 on 4/16. Will draw 1st vanc level on 4/19 and use this to determine maintenance dose and schedule.    Marcea Rojek D 07/16/2015,11:35 AM

## 2015-07-16 NOTE — H&P (Signed)
[facility] - [place] at [facility]   PATIENT NAME: Kimberly Montgomery    MR#:  [id number]  DATE OF BIRTH:  12/29/1965  DATE OF ADMISSION:  07/15/2015  PRIMARY CARE PHYSICIAN: No primary care provider on file.   REQUESTING/REFERRING PHYSICIAN:   CHIEF COMPLAINT:   Chief Complaint  Patient presents with  . Emesis    4 days    HISTORY OF PRESENT ILLNESS: Kimberly Montgomery  is a 50 y.o. female with a known history of end-stage renal disease on peritoneal dialysis artery disease congestive heart failure hypertension hyperlipidemia GERD presented to the emergency room with vomiting and diarrhea for the last couple of days. Patient recently had a cardiac catheter and was discharged on Wednesday and after going home she had some nausea vomiting and diarrhea which continued for the last 2-3 days. Vomitus contained food and water. And also has abdominal discomfort around the umbilical area. Today in the emergency room peritoneal fluid was sent for analysis and was found to be infected. She was given IV antibiotics for peritonitis in the emergency room and hospitalist service was consulted for further care. No history of any blood in the stool. No history of any vomiting of blood. No history of recent travel or sick contacts at home. Patient is on peritoneal dialysis for the last 2 years.  PAST MEDICAL HISTORY:   Past Medical History  Diagnosis Date  . Renal disorder   . Hypertension   . CHF (congestive heart failure) (HCC)   . Diabetes mellitus without complication (HCC)   . Gastroparesis   . Asthma   . ESRD (end stage renal disease) (HCC)   . Endometriosis   . CAD (coronary artery disease)   . HLD (hyperlipidemia)   . GERD (gastroesophageal reflux disease)     PAST SURGICAL HISTORY: Past Surgical History  Procedure Laterality Date  . Hysterotomy    . Below knee leg amputation    . Pd tube inserstion    . Hd fistula surgery with reversal    . Cardiac catheterization  Right 07/10/2015    Procedure: Left Heart Cath and Coronary Angiography;  Surgeon: Laurier Nancy, MD;  Location: [facility] INVASIVE CV LAB;  Service: Cardiovascular;  Laterality: Right;    SOCIAL HISTORY:  Social History  Substance Use Topics  . Smoking status: Current Every Day Smoker -- 0.50 packs/day    Types: Cigarettes  . Smokeless tobacco: Not on file  . Alcohol Use: No    FAMILY HISTORY:  Family History  Problem Relation Age of Onset  . CAD    . Diabetes    . Bipolar disorder    . Cervical cancer Mother     DRUG ALLERGIES:  Allergies  Allergen Reactions  . Cephalosporins Anaphylaxis  . Penicillins Anaphylaxis and Other (See Comments)    Has patient had a PCN reaction causing immediate rash, facial/tongue/throat swelling, SOB or lightheadedness with hypotension: Yes Has patient had a PCN reaction causing severe rash involving mucus membranes or skin necrosis: No Has patient had a PCN reaction that required hospitalization No Has patient had a PCN reaction occurring within the last 10 years: No If all of the above answers are "NO", then may proceed with Cephalosporin use.  [name] Phenergan [Promethazine Hcl] Nausea And Vomiting    REVIEW OF SYSTEMS:   CONSTITUTIONAL: low grade fever noted, no fatigue or weakness.  EYES: No blurred or double vision.  EARS, NOSE, AND THROAT: No tinnitus or ear pain.  RESPIRATORY: No  cough, shortness of breath, wheezing or hemoptysis.  CARDIOVASCULAR: No chest pain, orthopnea, edema.  GASTROINTESTINAL: Has nausea, vomiting, diarrhea and abdominal pain.  GENITOURINARY: No dysuria, hematuria.  ENDOCRINE: No polyuria, nocturia,  HEMATOLOGY: No anemia, easy bruising or bleeding SKIN: No rash or lesion. MUSCULOSKELETAL: No joint pain or arthritis.   NEUROLOGIC: No tingling, numbness, weakness.  PSYCHIATRY: No anxiety or depression.   MEDICATIONS AT HOME:  Prior to Admission medications   Medication Sig Start Date End Date Taking? Authorizing  Provider  acetaminophen (TYLENOL) 500 MG tablet Take 1,000 mg by mouth every 6 (six) hours as needed for mild pain.    Yes Historical Provider, MD  albuterol (PROVENTIL HFA;VENTOLIN HFA) 108 (90 Base) MCG/ACT inhaler Inhale 2 puffs into the lungs every 6 (six) hours as needed for wheezing or shortness of breath.   Yes Historical Provider, MD  amLODipine (NORVASC) 10 MG tablet Take 10 mg by mouth daily.   Yes Historical Provider, MD  aspirin EC 81 MG tablet Take 81 mg by mouth daily.   Yes Historical Provider, MD  aspirin-acetaminophen-caffeine (EXCEDRIN MIGRAINE) [phone] MG tablet Take 2 tablets by mouth every 6 (six) hours as needed for headache.   Yes Historical Provider, MD  buPROPion (WELLBUTRIN SR) 150 MG 12 hr tablet Take 150 mg by mouth daily.   Yes Historical Provider, MD  calcium acetate, Phos Binder, (PHOSLYRA) 667 MG/5ML SOLN Take 2,668 mg by mouth 3 (three) times daily with meals.    Yes Historical Provider, MD  carvedilol (COREG) 6.25 MG tablet Take 6.25 mg by mouth 2 (two) times daily with a meal.   Yes Historical Provider, MD  clopidogrel (PLAVIX) 75 MG tablet Take 75 mg by mouth daily.   Yes Historical Provider, MD  docusate sodium (COLACE) 100 MG capsule Take 100 mg by mouth at bedtime as needed for mild constipation.   Yes Historical Provider, MD  ezetimibe-simvastatin (VYTORIN) 10-40 MG tablet Take 1 tablet by mouth at bedtime.   Yes Historical Provider, MD  gabapentin (NEURONTIN) 600 MG tablet Take 600 mg by mouth 3 (three) times daily. 06/25/15  Yes Historical Provider, MD  insulin aspart (NOVOLOG) 100 UNIT/ML injection Inject 15 Units into the skin 3 (three) times daily with meals.   Yes Historical Provider, MD  insulin glargine (LANTUS) 100 UNIT/ML injection Inject 65 Units into the skin at bedtime.   Yes Historical Provider, MD  isosorbide mononitrate (IMDUR) 60 MG 24 hr tablet Take 1 tablet (60 mg total) by mouth daily. 07/11/15  Yes Auburn Bilberry, MD  lisinopril  (PRINIVIL,ZESTRIL) 40 MG tablet Take 40 mg by mouth daily. 06/26/15  Yes Historical Provider, MD  nitroGLYCERIN (NITROSTAT) 0.4 MG SL tablet Place 0.4 mg under the tongue every 5 (five) minutes as needed for chest pain. Reported on 07/10/2015   Yes Historical Provider, MD  omeprazole (PRILOSEC) 40 MG capsule Take 40 mg by mouth 2 (two) times daily.   Yes Historical Provider, MD  polyethylene glycol (MIRALAX / GLYCOLAX) packet Take 17 g by mouth daily as needed for mild constipation.   Yes Historical Provider, MD  ranolazine (RANEXA) 500 MG 12 hr tablet Take 1 tablet (500 mg total) by mouth 2 (two) times daily. 07/11/15  Yes Auburn Bilberry, MD  torsemide (DEMADEX) 100 MG tablet Take 100 mg by mouth daily.   Yes Historical Provider, MD  ondansetron (ZOFRAN ODT) 4 MG disintegrating tablet Take 1 tablet (4 mg total) by mouth every 6 (six) hours as needed for nausea or vomiting.  07/16/15   Sharyn Creamer, MD      PHYSICAL EXAMINATION:   VITAL SIGNS: Blood pressure 134/73, pulse 85, temperature 98.2 F (36.8 C), resp. rate 13, height 5\' 9"  (1.753 m), weight 117.935 kg (260 lb), last menstrual period 03/16/2015, SpO2 90 %.  GENERAL:  50 y.o.-year-old patient lying in the bed with no acute distress.  EYES: Pupils equal, round, reactive to light and accommodation. No scleral icterus. Extraocular muscles intact.  HEENT: Head atraumatic, normocephalic. Oropharynx and nasopharynx clear.  NECK:  Supple, no jugular venous distention. No thyroid enlargement, no tenderness.  LUNGS: Normal breath sounds bilaterally, no wheezing, rales,rhonchi or crepitation. No use of accessory muscles of respiration.  CARDIOVASCULAR: S1, S2 normal. No murmurs, rubs, or gallops.  ABDOMEN: Soft, mild tenderness around umbilicus, nondistended. Bowel sounds present. No organomegaly or mass.  EXTREMITIES: No pedal edema, cyanosis, or clubbing.  NEUROLOGIC: Cranial nerves II through XII are intact. Muscle strength 5/5 in all extremities.  Sensation intact. Gait normal. PSYCHIATRIC: The patient is alert and oriented x 3.  SKIN: No obvious rash, lesion, or ulcer.   LABORATORY PANEL:   CBC  Recent Labs Lab 07/09/15 2036 07/10/15 0238 07/10/15 0751 07/12/15 0429 07/15/15 1639  WBC 8.9 9.3 9.0 14.3* 9.7  HGB 10.1* 9.7* 9.5* 9.3* 10.7*  HCT 30.7* 29.1* 29.0* 28.4* 31.8*  PLT 287 298 290 342 352  MCV 85.5 85.8 84.3 84.6 83.8  MCH 28.0 28.5 27.7 27.6 28.1  MCHC 32.8 33.2 32.9 32.6 33.5  RDW 14.2 14.6* 14.7* 14.7* 14.4   ------------------------------------------------------------------------------------------------------------------  Chemistries   Recent Labs Lab 07/09/15 2036 07/10/15 0238 07/10/15 0751 07/15/15 1639  NA 134*  --  138 136  K 5.2*  --  5.7* 4.5  CL 107  --  110 100*  CO2 18*  --  20* 20*  GLUCOSE 525*  --  136* 155*  BUN 67*  --  69* 92*  CREATININE 8.37* 8.03* 8.28* 10.47*  CALCIUM 7.2*  --  7.2* 6.2*  AST  --   --   --  16  ALT  --   --   --  13*  ALKPHOS  --   --   --  102  BILITOT  --   --   --  0.6   ------------------------------------------------------------------------------------------------------------------ estimated creatinine clearance is 8.7 mL/min (by C-G formula based on Cr of 10.47). ------------------------------------------------------------------------------------------------------------------ No results for input(s): TSH, T4TOTAL, T3FREE, THYROIDAB in the last 72 hours.  Invalid input(s): FREET3   Coagulation profile No results for input(s): INR, PROTIME in the last 168 hours. ------------------------------------------------------------------------------------------------------------------- No results for input(s): DDIMER in the last 72 hours. -------------------------------------------------------------------------------------------------------------------  Cardiac Enzymes  Recent Labs Lab 07/10/15 0751 07/10/15 1558 07/15/15 2111  TROPONINI 0.14* 0.11*  0.05*   ------------------------------------------------------------------------------------------------------------------ Invalid input(s): POCBNP  ---------------------------------------------------------------------------------------------------------------  Urinalysis    Component Value Date/Time   COLORURINE YELLOW* 07/15/2015 2223   COLORURINE Straw 12/31/2013 1818   APPEARANCEUR CLEAR* 07/15/2015 2223   APPEARANCEUR Clear 12/31/2013 1818   LABSPEC 1.015 07/15/2015 2223   LABSPEC 1.009 12/31/2013 1818   PHURINE 6.0 07/15/2015 2223   PHURINE 6.0 12/31/2013 1818   GLUCOSEU >500* 07/15/2015 2223   GLUCOSEU 150 mg/dL 14/78/2956 2130   HGBUR 1+* 07/15/2015 2223   HGBUR Negative 12/31/2013 1818   BILIRUBINUR NEGATIVE 07/15/2015 2223   BILIRUBINUR Negative 12/31/2013 1818   KETONESUR TRACE* 07/15/2015 2223   KETONESUR Negative 12/31/2013 1818   PROTEINUR >500* 07/15/2015 2223   PROTEINUR 100 mg/dL 86/57/8469 6295  NITRITE NEGATIVE 07/15/2015 2223   NITRITE Negative 12/31/2013 1818   LEUKOCYTESUR NEGATIVE 07/15/2015 2223   LEUKOCYTESUR Negative 12/31/2013 1818     RADIOLOGY: Ct Abdomen Pelvis Wo Contrast  07/15/2015  CLINICAL DATA:  Nausea, vomiting and diarrhea with abdominal pain for 4 days. End-stage renal disease. EXAM: CT ABDOMEN AND PELVIS WITHOUT CONTRAST TECHNIQUE: Multidetector CT imaging of the abdomen and pelvis was performed following the standard protocol without IV contrast. COMPARISON:  11/13/2013 CT abdomen/pelvis. FINDINGS: Lower chest: No significant pulmonary nodules or acute consolidative airspace disease. Coronary atherosclerosis. Coarse mitral annular calcification. Hepatobiliary: Normal liver with no liver mass. Gallbladder is filled with high density material. No gallbladder wall thickening or pericholecystic fluid. No biliary ductal dilatation. Pancreas: Normal, with no mass or duct dilation. Spleen: Normal size. No mass. Adrenals/Urinary Tract: Normal  adrenals. Calcifications in the bilateral renal sinuses are favored to represent vascular calcifications. No hydronephrosis. No contour deforming renal masses. The normal caliber ureters, with no ureteral stones. Normal bladder. Stomach/Bowel: Small hiatal hernia. Otherwise grossly normal stomach. Normal caliber small bowel with no small bowel wall thickening. Normal appendix Normal large bowel with no diverticulosis, large bowel wall thickening or pericolonic fat stranding. Vascular/Lymphatic: Atherosclerotic nonaneurysmal abdominal aorta. No pathologically enlarged lymph nodes in the abdomen or pelvis. Reproductive: There is a 3.6 x 2.2 cm solid left adnexal mass (series 2/ image 71), stable since 11/13/2013 CT study, which could represent an asymmetrically prominent left ovary or less likely an exophytic left fundal uterine fibroid. No additional adnexal masses. Other: No pneumoperitoneum, ascites or focal fluid collection. Ventral right lower abdominal approach percutaneous catheter terminates in the anterior left upper pelvis. Musculoskeletal: No aggressive appearing focal osseous lesions. Moderate degenerative changes in the visualized thoracolumbar spine. Prominent enlarged Schmorl's nodes at L3-4. Mild anasarca. IMPRESSION: 1. No acute abnormality. No evidence of bowel obstruction or acute bowel inflammation. Normal appendix. 2. Gallbladder is filled with nonspecific high density material, which could represent vicarious excretion of IV contrast if the patient has received a recent dose of IV contrast, or less likely could represent sludge, stones or hemorrhage. No CT findings to suggest acute cholecystitis. Correlate with right upper quadrant abdominal sonography as clinically warranted. 3. Chronic solid left adnexal mass, stable since 2015, differential includes asymmetrically prominent left ovary or exophytic left fundal fibroid, correlate with pelvic sonography on a short term outpatient basis as  clinically warranted. 4. Worsened degenerative disc disease at L3-4. 5. Additional findings include coronary atherosclerosis and small hiatal hernia. Electronically Signed   By: Delbert Phenix M.D.   On: 07/15/2015 23:51    EKG: Orders placed or performed during the hospital encounter of 07/15/15  . ED EKG  . ED EKG  . ED EKG  . ED EKG  . EKG 12-Lead  . EKG 12-Lead    IMPRESSION AND PLAN: 49 year old female patient with history of end-stage renal disease on peritoneal dialysis, hypertension, coronary artery disease, congestive heart failure presented to the emergency room with nausea and vomiting and abdominal discomfort. Admitting diagnosis 1. Nausea and vomiting 2. Acute peritonitis 3. End-stage renal disease on dialysis 4. Mildly elevated troponin which could be secondary to renal failure 5. Hypertension 6. Congestive heart failure Treatment plan Admit patient to telemetry IV fluid hydration Start patient on IV Levaquin antibiotic Follow-up of peritoneal fluid culture Antiemetic medication Resume cardiac medications DVT prophylaxis subcutaneous heparin.  All the records are reviewed and case discussed with ED provider. Management plans discussed with the patient, family and they are  in agreement.  CODE STATUS:FULL Code Status History    Date Active Date Inactive Code Status Order ID Comments User Context   07/10/2015  1:49 AM 07/12/2015  1:01 PM Full Code [id number]  Oralia Manis, MD Inpatient   05/12/2015  3:46 PM 05/16/2015  5:35 PM Full Code [id number]  Auburn Bilberry, MD ED       TOTAL TIME TAKING CARE OF THIS PATIENT: 51 minutes.    Ihor Austin M.D on 07/16/2015 at 5:49 AM  Between 7am to 6pm - Pager - [phone]  After 6pm go to [email or web address] - password [email or web address] [facility]  [place] [facility]  Office  [phone]  CC: Primary care physician; No primary care provider on file.

## 2015-07-16 NOTE — ED Provider Notes (Signed)
-----------------------------------------   2:52 AM on 07/16/2015 -----------------------------------------  Updated patient of body fluid cell count results which are concerning for SBP. Will start IV antibiotic; discuss with hospitalist to evaluate in the emergency department for admission.  Irean Hong, MD 07/16/15 [phone]

## 2015-07-16 NOTE — Consult Note (Signed)
GI Inpatient Consult Note  Reason for Consult: Nausea,vomiting, and diarrhea.   Attending Requesting Consult:  History of Present Illness: Kimberly Montgomery is a 50 y.o. female with ESRD on PD x 1 year, who started having N/V/D associated with diffuse abdominal pain on Wed. Had cardiac cath done on Tues. Has long hx of DM gastroparesis. On erythromycin qid x 3 weeks at a time. Tried reglan before without relief. Followed at [facility] for gastroparesis. No rectal bleeding. Peritoneal fluid positive for infection. Denies any liver disease, etc.  Past Medical History:  Past Medical History  Diagnosis Date  . Renal disorder   . Hypertension   . CHF (congestive heart failure) (HCC)   . Diabetes mellitus without complication (HCC)   . Gastroparesis   . Asthma   . ESRD (end stage renal disease) (HCC)   . Endometriosis   . CAD (coronary artery disease)   . HLD (hyperlipidemia)   . GERD (gastroesophageal reflux disease)     Problem List: Patient Active Problem List   Diagnosis Date Noted  . Peritonitis (HCC) 07/16/2015  . Unstable angina (HCC) 07/09/2015  . ESRD on peritoneal dialysis (HCC) 07/09/2015  . Accelerated hypertension 07/09/2015  . Type 2 diabetes mellitus (HCC) 07/09/2015  . CAD (coronary artery disease) 07/09/2015  . HLD (hyperlipidemia) 07/09/2015  . GERD (gastroesophageal reflux disease) 07/09/2015  . Chest pain 05/12/2015    Past Surgical History: Past Surgical History  Procedure Laterality Date  . Hysterotomy    . Below knee leg amputation    . Pd tube inserstion    . Hd fistula surgery with reversal    . Cardiac catheterization Right 07/10/2015    Procedure: Left Heart Cath and Coronary Angiography;  Surgeon: Laurier Nancy, MD;  Location: [facility] INVASIVE CV LAB;  Service: Cardiovascular;  Laterality: Right;    Allergies: Allergies  Allergen Reactions  . Cephalosporins Anaphylaxis  . Penicillins Anaphylaxis and Other (See Comments)    Has patient had a PCN  reaction causing immediate rash, facial/tongue/throat swelling, SOB or lightheadedness with hypotension: Yes Has patient had a PCN reaction causing severe rash involving mucus membranes or skin necrosis: No Has patient had a PCN reaction that required hospitalization No Has patient had a PCN reaction occurring within the last 10 years: No If all of the above answers are "NO", then may proceed with Cephalosporin use.  [name] Phenergan [Promethazine Hcl] Nausea And Vomiting    Home Medications: Prescriptions prior to admission  Medication Sig Dispense Refill Last Dose  . acetaminophen (TYLENOL) 500 MG tablet Take 1,000 mg by mouth every 6 (six) hours as needed for mild pain.    07/15/2015 at Unknown time  . albuterol (PROVENTIL HFA;VENTOLIN HFA) 108 (90 Base) MCG/ACT inhaler Inhale 2 puffs into the lungs every 6 (six) hours as needed for wheezing or shortness of breath.   07/15/2015 at Unknown time  . amLODipine (NORVASC) 10 MG tablet Take 10 mg by mouth daily.   07/15/2015 at Unknown time  . aspirin EC 81 MG tablet Take 81 mg by mouth daily.   07/15/2015 at Unknown time  . aspirin-acetaminophen-caffeine (EXCEDRIN MIGRAINE) 250-250-65 MG tablet Take 2 tablets by mouth every 6 (six) hours as needed for headache.   PRN at PRN  . buPROPion (WELLBUTRIN SR) 150 MG 12 hr tablet Take 150 mg by mouth daily.   07/15/2015 at Unknown time  . calcium acetate, Phos Binder, (PHOSLYRA) 667 MG/5ML SOLN Take 2,668 mg by mouth 3 (three) times daily  with meals.    07/15/2015 at Unknown time  . carvedilol (COREG) 6.25 MG tablet Take 6.25 mg by mouth 2 (two) times daily with a meal.   07/15/2015 at Unknown time  . clopidogrel (PLAVIX) 75 MG tablet Take 75 mg by mouth daily.   07/15/2015 at Unknown time  . docusate sodium (COLACE) 100 MG capsule Take 100 mg by mouth at bedtime as needed for mild constipation.   PRN at PRN  . ezetimibe-simvastatin (VYTORIN) 10-40 MG tablet Take 1 tablet by mouth at bedtime.   07/14/2015 at Unknown  time  . gabapentin (NEURONTIN) 600 MG tablet Take 600 mg by mouth 3 (three) times daily.  5 07/15/2015 at Unknown time  . insulin aspart (NOVOLOG) 100 UNIT/ML injection Inject 15 Units into the skin 3 (three) times daily with meals.   07/15/2015 at Unknown time  . insulin glargine (LANTUS) 100 UNIT/ML injection Inject 65 Units into the skin at bedtime.   07/14/2015 at Unknown time  . isosorbide mononitrate (IMDUR) 60 MG 24 hr tablet Take 1 tablet (60 mg total) by mouth daily. 30 tablet 00 07/15/2015 at Unknown time  . lisinopril (PRINIVIL,ZESTRIL) 40 MG tablet Take 40 mg by mouth daily.  3 07/15/2015 at Unknown time  . nitroGLYCERIN (NITROSTAT) 0.4 MG SL tablet Place 0.4 mg under the tongue every 5 (five) minutes as needed for chest pain. Reported on 07/10/2015   PRN at PRN  . omeprazole (PRILOSEC) 40 MG capsule Take 40 mg by mouth 2 (two) times daily.   07/15/2015 at Unknown time  . polyethylene glycol (MIRALAX / GLYCOLAX) packet Take 17 g by mouth daily as needed for mild constipation.   PRN at PRN  . ranolazine (RANEXA) 500 MG 12 hr tablet Take 1 tablet (500 mg total) by mouth 2 (two) times daily. 60 tablet 0 07/15/2015 at Unknown time  . torsemide (DEMADEX) 100 MG tablet Take 100 mg by mouth daily.   07/15/2015 at Unknown time   Home medication reconciliation was completed with the patient.   Scheduled Inpatient Medications:   . amLODipine  10 mg Oral Daily  . aspirin EC  81 mg Oral Daily  . buPROPion  150 mg Oral Daily  . calcium acetate  2,668 mg Oral TID WC  . carvedilol  6.25 mg Oral BID WC  . clopidogrel  75 mg Oral Daily  . ezetimibe  10 mg Oral QHS   And  . simvastatin  40 mg Oral QHS  . gabapentin  600 mg Oral TID  . heparin  5,000 Units Subcutaneous 3 times per day  . isosorbide mononitrate  60 mg Oral Daily  . [START ON 07/17/2015] levofloxacin (LEVAQUIN) IV  500 mg Intravenous Q48H  . lisinopril  40 mg Oral Daily  . pantoprazole  40 mg Oral Daily  . ranolazine  500 mg Oral BID  .  sodium chloride flush  3 mL Intravenous Q12H  . sodium chloride flush  3 mL Intravenous Q12H  . torsemide  100 mg Oral Daily  . vancomycin  1,000 mg Intravenous Q24H    Continuous Inpatient Infusions:   . sodium chloride 75 mL/hr at 07/16/15 0643    PRN Inpatient Medications:  sodium chloride, acetaminophen **OR** acetaminophen, albuterol, docusate sodium, morphine injection, ondansetron **OR** ondansetron (ZOFRAN) IV, polyethylene glycol, prochlorperazine, sodium chloride flush  Family History: family history includes Cervical cancer in her mother.  The patient's family history is negative for inflammatory bowel disorders, GI malignancy, or solid organ transplantation.  Social History:   reports that she has been smoking Cigarettes.  She has been smoking about 0.50 packs per day. She does not have any smokeless tobacco history on file. She reports that she does not drink alcohol or use illicit drugs. The patient denies ETOH, tobacco, or drug use.   Review of Systems: Constitutional: Weight is stable.  Eyes: No changes in vision. ENT: No oral lesions, sore throat.  GI: see HPI.  Heme/Lymph: No easy bruising.  CV: No chest pain.  GU: No hematuria.  Integumentary: No rashes.  Neuro: No headaches.  Psych: No depression/anxiety.  Endocrine: No heat/cold intolerance.  Allergic/Immunologic: No urticaria.  Resp: No cough, SOB.  Musculoskeletal: No joint swelling.    Physical Examination: BP 162/74 mmHg  Pulse 87  Temp(Src) 97.8 F (36.6 C) (Oral)  Resp 22  Ht 5' 9.02" (1.753 m)  Wt 118.616 kg (261 lb 8 oz)  BMI 38.60 kg/m2  SpO2 98%  LMP 03/16/2015 Gen: NAD, alert and oriented x 4 HEENT: PEERLA, EOMI, Neck: supple, no JVD or thyromegaly Chest: CTA bilaterally, no wheezes, crackles, or other adventitious sounds CV: RRR, no m/g/c/r Abd: soft, diffuse tenderness, ND, +BS in all four quadrants; no HSM, guarding, ridigity, or rebound tenderness Ext: no edema, well perfused  with 2+ pulses, Skin: no rash or lesions noted Lymph: no LAD  Data: Lab Results  Component Value Date   WBC 10.8 07/16/2015   HGB 9.4* 07/16/2015   HCT 29.0* 07/16/2015   MCV 83.8 07/16/2015   PLT 322 07/16/2015    Recent Labs Lab 07/12/15 0429 07/15/15 1639 07/16/15 0803  HGB 9.3* 10.7* 9.4*   Lab Results  Component Value Date   NA 136 07/15/2015   K 4.5 07/15/2015   CL 100* 07/15/2015   CO2 20* 07/15/2015   BUN 92* 07/15/2015   CREATININE 9.44* 07/16/2015   Lab Results  Component Value Date   ALT 13* 07/15/2015   AST 16 07/15/2015   ALKPHOS 102 07/15/2015   BILITOT 0.6 07/15/2015   No results for input(s): APTT, INR, PTT in the last 168 hours. Assessment/Plan: Ms. Hilaire is a 50 y.o. female with acute peritonitis, worsening GI symptoms as well.  Recommendations: Agree with Abx coverage. Have to make sure PD catheter not infected. May need to get ID involved if not clinically better soon. Expect GI symptoms of N/V/D to resolve once peritonitis clears. Agree with checking stool studies, however. May need to add erythromycin qid if nausea persists. Will follow.  Thank you for the consult. Please call with questions or concerns.  Nora Sabey, Ezzard Standing, MD

## 2015-07-16 NOTE — Discharge Instructions (Signed)
You were seen in the emergency room for abdominal pain. It is important that you follow up closely with your primary care doctor in the next couple of days.  If you're unable to see her primary care doctor you may return to the emergency room or go to the [place] walk-in clinic in 1 or 2 days for reexam.  Please return to the emergency room right away if you are to develop a fever, severe nausea, your pain becomes severe or worsens, you are unable to keep food down, begin vomiting any dark or bloody fluid, you develop any dark or bloody stools, feel dehydrated, or other new concerns or symptoms arise.   Nausea and Vomiting Nausea is a sick feeling that often comes before throwing up (vomiting). Vomiting is a reflex where stomach contents come out of your mouth. Vomiting can cause severe loss of body fluids (dehydration). Children and elderly adults can become dehydrated quickly, especially if they also have diarrhea. Nausea and vomiting are symptoms of a condition or disease. It is important to find the cause of your symptoms. CAUSES   Direct irritation of the stomach lining. This irritation can result from increased acid production (gastroesophageal reflux disease), infection, food poisoning, taking certain medicines (such as nonsteroidal anti-inflammatory drugs), alcohol use, or tobacco use.  Signals from the brain.These signals could be caused by a headache, heat exposure, an inner ear disturbance, increased pressure in the brain from injury, infection, a tumor, or a concussion, pain, emotional stimulus, or metabolic problems.  An obstruction in the gastrointestinal tract (bowel obstruction).  Illnesses such as diabetes, hepatitis, gallbladder problems, appendicitis, kidney problems, cancer, sepsis, atypical symptoms of a heart attack, or eating disorders.  Medical treatments such as chemotherapy and radiation.  Receiving medicine that makes you sleep (general anesthetic) during  surgery. DIAGNOSIS Your caregiver may ask for tests to be done if the problems do not improve after a few days. Tests may also be done if symptoms are severe or if the reason for the nausea and vomiting is not clear. Tests may include:  Urine tests.  Blood tests.  Stool tests.  Cultures (to look for evidence of infection).  X-rays or other imaging studies. Test results can help your caregiver make decisions about treatment or the need for additional tests. TREATMENT You need to stay well hydrated. Drink frequently but in small amounts.You may wish to drink water, sports drinks, clear broth, or eat frozen ice pops or gelatin dessert to help stay hydrated.When you eat, eating slowly may help prevent nausea.There are also some antinausea medicines that may help prevent nausea. HOME CARE INSTRUCTIONS   Take all medicine as directed by your caregiver.  If you do not have an appetite, do not force yourself to eat. However, you must continue to drink fluids.  If you have an appetite, eat a normal diet unless your caregiver tells you differently.  Eat a variety of complex carbohydrates (rice, wheat, potatoes, bread), lean meats, yogurt, fruits, and vegetables.  Avoid high-fat foods because they are more difficult to digest.  Drink enough water and fluids to keep your urine clear or pale yellow.  If you are dehydrated, ask your caregiver for specific rehydration instructions. Signs of dehydration may include:  Severe thirst.  Dry lips and mouth.  Dizziness.  Dark urine.  Decreasing urine frequency and amount.  Confusion.  Rapid breathing or pulse. SEEK IMMEDIATE MEDICAL CARE IF:   You have blood or brown flecks (like coffee grounds) in your vomit.  You have black or bloody stools.  You have a severe headache or stiff neck.  You are confused.  You have severe abdominal pain.  You have chest pain or trouble breathing.  You do not urinate at least once every 8  hours.  You develop cold or clammy skin.  You continue to vomit for longer than 24 to 48 hours.  You have a fever. MAKE SURE YOU:   Understand these instructions.  Will watch your condition.  Will get help right away if you are not doing well or get worse.   This information is not intended to replace advice given to you by your health care provider. Make sure you discuss any questions you have with your health care provider.   Document Released: 03/18/2005 Document Revised: 06/10/2011 Document Reviewed: 08/15/2010 Elsevier Interactive Patient Education [employer]2016 [employer]vier Inc.

## 2015-07-16 NOTE — Progress Notes (Signed)
Subjective:  Patient known to our practice from previous admission. She reports after she was discharged to home on Wednesday, she has been experiencing nausea, vomiting and diarrhea. She was able to do her PD at home. She does not know if her fluid was cloudy because the effluent goes directly in the bathtub. No fevers, no shortness of breath. She presented to the emergency room with diffuse abdominal pain PD fluid evaluation in the ER shows WBC count of 690 with 73% neutrophils. blood cultures are pending. PD fluid shows no growth in less than 12 hours. CT of the abdomen without contrast shows gallbladder sludge, chronic solid left adnexal mass, degenerative disc disease L3-L4, coronary atherosclerosis and small hiatal hernia.  Objective:  Vital signs in last 24 hours:  Temp:  [97.7 F (36.5 C)-98.2 F (36.8 C)] 97.7 F (36.5 C) (04/16 1216) Pulse Rate:  [82-93] 92 (04/16 1216) Resp:  [12-22] 18 (04/16 1216) BP: (134-191)/(59-140) 147/88 mmHg (04/16 1216) SpO2:  [90 %-100 %] 95 % (04/16 1216) Weight:  [117.935 kg (260 lb)-118.616 kg (261 lb 8 oz)] 118.616 kg (261 lb 8 oz) (04/16 0751)  Weight change:  Filed Weights   07/15/15 1634 07/16/15 0751  Weight: 117.935 kg (260 lb) 118.616 kg (261 lb 8 oz)    Intake/Output:    Intake/Output Summary (Last 24 hours) at 07/16/15 1244 Last data filed at 07/16/15 0916  Gross per 24 hour  Intake      0 ml  Output      0 ml  Net      0 ml     Physical Exam: General: Mild distress from abdominal pain  HEENT Anicteric, moist oral mucous membranes  Neck supple  Pulm/lungs Normal effort, clear to auscultation bilaterally  CVS/Heart No rub or gallop  Abdomen:  Soft, diffuse abdominal tenderness,  Extremities: ++  peripheral edema, right BKA  Neurologic: Alert, oriented, speech normal  Skin: No acute rashes  Access: PD catheter,exit site nontender       Basic Metabolic Panel:   Recent Labs Lab 07/09/15 2036 07/10/15 0238  07/10/15 0751 07/11/15 1702 07/15/15 1639 07/16/15 0803  NA 134*  --  138  --  136  --   K 5.2*  --  5.7*  --  4.5  --   CL 107  --  110  --  100*  --   CO2 18*  --  20*  --  20*  --   GLUCOSE 525*  --  136*  --  155*  --   BUN 67*  --  69*  --  92*  --   CREATININE 8.37* 8.03* 8.28*  --  10.47* 9.44*  CALCIUM 7.2*  --  7.2*  --  6.2*  --   PHOS  --   --   --  11.3*  --   --      CBC:  Recent Labs Lab 07/10/15 0238 07/10/15 0751 07/12/15 0429 07/15/15 1639 07/16/15 0803  WBC 9.3 9.0 14.3* 9.7 10.8  HGB 9.7* 9.5* 9.3* 10.7* 9.4*  HCT 29.1* 29.0* 28.4* 31.8* 29.0*  MCV 85.8 84.3 84.6 83.8 83.8  PLT 298 290 342 352 322      Microbiology:  Recent Results (from the past 720 hour(s))  Body fluid culture     Status: None (Preliminary result)   Collection Time: 07/15/15 10:23 PM  Result Value Ref Range Status   Specimen Description PERITONEAL  Final   Special Requests Normal  Final  Gram Stain PENDING  Incomplete   Culture NO GROWTH < 12 HOURS  Final   Report Status PENDING  Incomplete  Culture, blood (routine x 2)     Status: None (Preliminary result)   Collection Time: 07/16/15  3:58 AM  Result Value Ref Range Status   Specimen Description BLOOD RIGHT WRIST  Final   Special Requests   Final    BOTTLES DRAWN AEROBIC AND ANAEROBIC 12CCAERO,6CCANA   Culture NO GROWTH < 12 HOURS  Final   Report Status PENDING  Incomplete  Culture, blood (routine x 2)     Status: None (Preliminary result)   Collection Time: 07/16/15  3:58 AM  Result Value Ref Range Status   Specimen Description BLOOD RIGHT FOREARM  Final   Special Requests   Final    BOTTLES DRAWN AEROBIC AND ANAEROBIC 12CCAERO,2CCANA   Culture NO GROWTH < 12 HOURS  Final   Report Status PENDING  Incomplete    Coagulation Studies: No results for input(s): LABPROT, INR in the last 72 hours.  Urinalysis:  Recent Labs  07/15/15 2223  COLORURINE YELLOW*  LABSPEC 1.015  PHURINE 6.0  GLUCOSEU >500*  HGBUR  1+*  BILIRUBINUR NEGATIVE  KETONESUR TRACE*  PROTEINUR >500*  NITRITE NEGATIVE  LEUKOCYTESUR NEGATIVE      Imaging: Ct Abdomen Pelvis Wo Contrast  07/15/2015  CLINICAL DATA:  Nausea, vomiting and diarrhea with abdominal pain for 4 days. End-stage renal disease. EXAM: CT ABDOMEN AND PELVIS WITHOUT CONTRAST TECHNIQUE: Multidetector CT imaging of the abdomen and pelvis was performed following the standard protocol without IV contrast. COMPARISON:  11/13/2013 CT abdomen/pelvis. FINDINGS: Lower chest: No significant pulmonary nodules or acute consolidative airspace disease. Coronary atherosclerosis. Coarse mitral annular calcification. Hepatobiliary: Normal liver with no liver mass. Gallbladder is filled with high density material. No gallbladder wall thickening or pericholecystic fluid. No biliary ductal dilatation. Pancreas: Normal, with no mass or duct dilation. Spleen: Normal size. No mass. Adrenals/Urinary Tract: Normal adrenals. Calcifications in the bilateral renal sinuses are favored to represent vascular calcifications. No hydronephrosis. No contour deforming renal masses. The normal caliber ureters, with no ureteral stones. Normal bladder. Stomach/Bowel: Small hiatal hernia. Otherwise grossly normal stomach. Normal caliber small bowel with no small bowel wall thickening. Normal appendix Normal large bowel with no diverticulosis, large bowel wall thickening or pericolonic fat stranding. Vascular/Lymphatic: Atherosclerotic nonaneurysmal abdominal aorta. No pathologically enlarged lymph nodes in the abdomen or pelvis. Reproductive: There is a 3.6 x 2.2 cm solid left adnexal mass (series 2/ image 71), stable since 11/13/2013 CT study, which could represent an asymmetrically prominent left ovary or less likely an exophytic left fundal uterine fibroid. No additional adnexal masses. Other: No pneumoperitoneum, ascites or focal fluid collection. Ventral right lower abdominal approach percutaneous catheter  terminates in the anterior left upper pelvis. Musculoskeletal: No aggressive appearing focal osseous lesions. Moderate degenerative changes in the visualized thoracolumbar spine. Prominent enlarged Schmorl's nodes at L3-4. Mild anasarca. IMPRESSION: 1. No acute abnormality. No evidence of bowel obstruction or acute bowel inflammation. Normal appendix. 2. Gallbladder is filled with nonspecific high density material, which could represent vicarious excretion of IV contrast if the patient has received a recent dose of IV contrast, or less likely could represent sludge, stones or hemorrhage. No CT findings to suggest acute cholecystitis. Correlate with right upper quadrant abdominal sonography as clinically warranted. 3. Chronic solid left adnexal mass, stable since 2015, differential includes asymmetrically prominent left ovary or exophytic left fundal fibroid, correlate with pelvic sonography on a short  term outpatient basis as clinically warranted. 4. Worsened degenerative disc disease at L3-4. 5. Additional findings include coronary atherosclerosis and small hiatal hernia. Electronically Signed   By: Delbert Phenix M.D.   On: 07/15/2015 23:51     Medications:     . amLODipine  10 mg Oral Daily  . aspirin EC  81 mg Oral Daily  . buPROPion  150 mg Oral Daily  . carvedilol  6.25 mg Oral BID WC  . clopidogrel  75 mg Oral Daily  . ezetimibe  10 mg Oral QHS   And  . simvastatin  40 mg Oral QHS  . gabapentin  600 mg Oral TID  . heparin  5,000 Units Subcutaneous 3 times per day  . isosorbide mononitrate  60 mg Oral Daily  . [START ON 07/17/2015] levofloxacin (LEVAQUIN) IV  500 mg Intravenous Q48H  . lisinopril  40 mg Oral Daily  . pantoprazole  40 mg Oral Daily  . ranolazine  500 mg Oral BID  . sodium chloride flush  3 mL Intravenous Q12H  . sodium chloride flush  3 mL Intravenous Q12H  . vancomycin  2,500 mg Intravenous Once   sodium chloride, acetaminophen **OR** acetaminophen, albuterol, docusate  sodium, morphine injection, ondansetron **OR** ondansetron (ZOFRAN) IV, polyethylene glycol, prochlorperazine, sodium chloride flush  Assessment/ Plan:  50 y.o. female with past medical history of end-stage renal disease on peritoneal dialysis, asthma, gastroparesis, diabetes mellitus, congestive heart failure, hypertension, endometriosis, coronary atherosclerosis, medical management  [facility]/[address]/peritoneal dialysis  1. End-stage renal disease on peritoneal dialysis.  - patient does not remember her PD prescription. - we will go with 3 exchanges of 2500 cc with 2.5% dextrose - Monitor cell count on a periodic basis - pain control with morphine  2. Acute peritonitis in PD patient - WBC cell count 690 with 73% neutrophils - PD fluid culture is pending - Empiric treatment with broad-spectrum antibiotics vancomycin and Levaquin  3. Anemia chronic kidney disease.  - Hgb 9.4 - we'll need to confirm her outpatient dose and continue it during inpatient stay  4. Secondary hyperparathyroidism.  - monitor phosphorus during hospital stay     LOS: 0 Vincente Asbridge 4/16/201712:44 PM

## 2015-07-16 NOTE — Progress Notes (Signed)
[facility] - [place] at [facility]   PATIENT NAME: Kimberly Montgomery    MR#:  [id number]  DATE OF BIRTH:  1965-09-05  SUBJECTIVE:  CHIEF COMPLAINT: Patient is very nauseous and vomiting. Reporting generalized abdominal pain. Diarrhea is improving. Denies any chest pain or shortness of breath at this time.     REVIEW OF SYSTEMS:  CONSTITUTIONAL: No fever, fatigue or weakness.  EYES: No blurred or double vision.  EARS, NOSE, AND THROAT: No tinnitus or ear pain.  RESPIRATORY: No cough, shortness of breath, wheezing or hemoptysis.  CARDIOVASCULAR: No chest pain, orthopnea, edema.  GASTROINTESTINAL: Reporting  nausea, vomiting,  denies diarrhea .reporting generalized abdominal pain.  GENITOURINARY: No dysuria, hematuria.  ENDOCRINE: No polyuria, nocturia,  HEMATOLOGY: No anemia, easy bruising or bleeding SKIN: No rash or lesion. MUSCULOSKELETAL: No joint pain or arthritis.   NEUROLOGIC: No tingling, numbness, weakness.  PSYCHIATRY: No anxiety or depression.   DRUG ALLERGIES:   Allergies  Allergen Reactions  . Cephalosporins Anaphylaxis  . Penicillins Anaphylaxis and Other (See Comments)    Has patient had a PCN reaction causing immediate rash, facial/tongue/throat swelling, SOB or lightheadedness with hypotension: Yes Has patient had a PCN reaction causing severe rash involving mucus membranes or skin necrosis: No Has patient had a PCN reaction that required hospitalization No Has patient had a PCN reaction occurring within the last 10 years: No If all of the above answers are "NO", then may proceed with Cephalosporin use.  [name] Phenergan [Promethazine Hcl] Nausea And Vomiting    VITALS:  Blood pressure 147/88, pulse 92, temperature 97.7 F (36.5 C), temperature source Oral, resp. rate 18, height 5' 9.02" (1.753 m), weight 118.616 kg (261 lb 8 oz), last menstrual period 03/16/2015, SpO2 95 %.  PHYSICAL EXAMINATION:  GENERAL:  50 y.o.-year-old patient lying in  the bed with no acute distress.  EYES: Pupils equal, round, reactive to light and accommodation. No scleral icterus. Extraocular muscles intact.  HEENT: Head atraumatic, normocephalic. Oropharynx and nasopharynx clear.  NECK:  Supple, no jugular venous distention. No thyroid enlargement, no tenderness.  LUNGS: Normal breath sounds bilaterally, no wheezing, rales,rhonchi or crepitation. No use of accessory muscles of respiration.  CARDIOVASCULAR: S1, S2 normal. No murmurs, rubs, or gallops.  ABDOMEN: Soft, diffusely tender. Peritoneal dialysis catheter is intact. No rebound tenderness., nondistended. Bowel sounds present. No organomegaly or mass.  EXTREMITIES: No pedal edema, cyanosis, or clubbing.  NEUROLOGIC: Cranial nerves II through XII are intact. Muscle strength 5/5 in all extremities. Sensation intact. Gait not checked.  PSYCHIATRIC: The patient is alert and oriented x 3.  SKIN: No obvious rash, lesion, or ulcer.    LABORATORY PANEL:   CBC  Recent Labs Lab 07/16/15 0803  WBC 10.8  HGB 9.4*  HCT 29.0*  PLT 322   ------------------------------------------------------------------------------------------------------------------  Chemistries   Recent Labs Lab 07/15/15 1639 07/16/15 0803  NA 136  --   K 4.5  --   CL 100*  --   CO2 20*  --   GLUCOSE 155*  --   BUN 92*  --   CREATININE 10.47* 9.44*  CALCIUM 6.2*  --   AST 16  --   ALT 13*  --   ALKPHOS 102  --   BILITOT 0.6  --    ------------------------------------------------------------------------------------------------------------------  Cardiac Enzymes  Recent Labs Lab 07/15/15 2111  TROPONINI 0.05*   ------------------------------------------------------------------------------------------------------------------  RADIOLOGY:  Ct Abdomen Pelvis Wo Contrast  07/15/2015  CLINICAL DATA:  Nausea, vomiting and diarrhea with  abdominal pain for 4 days. End-stage renal disease. EXAM: CT ABDOMEN AND PELVIS  WITHOUT CONTRAST TECHNIQUE: Multidetector CT imaging of the abdomen and pelvis was performed following the standard protocol without IV contrast. COMPARISON:  11/13/2013 CT abdomen/pelvis. FINDINGS: Lower chest: No significant pulmonary nodules or acute consolidative airspace disease. Coronary atherosclerosis. Coarse mitral annular calcification. Hepatobiliary: Normal liver with no liver mass. Gallbladder is filled with high density material. No gallbladder wall thickening or pericholecystic fluid. No biliary ductal dilatation. Pancreas: Normal, with no mass or duct dilation. Spleen: Normal size. No mass. Adrenals/Urinary Tract: Normal adrenals. Calcifications in the bilateral renal sinuses are favored to represent vascular calcifications. No hydronephrosis. No contour deforming renal masses. The normal caliber ureters, with no ureteral stones. Normal bladder. Stomach/Bowel: Small hiatal hernia. Otherwise grossly normal stomach. Normal caliber small bowel with no small bowel wall thickening. Normal appendix Normal large bowel with no diverticulosis, large bowel wall thickening or pericolonic fat stranding. Vascular/Lymphatic: Atherosclerotic nonaneurysmal abdominal aorta. No pathologically enlarged lymph nodes in the abdomen or pelvis. Reproductive: There is a 3.6 x 2.2 cm solid left adnexal mass (series 2/ image 71), stable since 11/13/2013 CT study, which could represent an asymmetrically prominent left ovary or less likely an exophytic left fundal uterine fibroid. No additional adnexal masses. Other: No pneumoperitoneum, ascites or focal fluid collection. Ventral right lower abdominal approach percutaneous catheter terminates in the anterior left upper pelvis. Musculoskeletal: No aggressive appearing focal osseous lesions. Moderate degenerative changes in the visualized thoracolumbar spine. Prominent enlarged Schmorl's nodes at L3-4. Mild anasarca. IMPRESSION: 1. No acute abnormality. No evidence of bowel  obstruction or acute bowel inflammation. Normal appendix. 2. Gallbladder is filled with nonspecific high density material, which could represent vicarious excretion of IV contrast if the patient has received a recent dose of IV contrast, or less likely could represent sludge, stones or hemorrhage. No CT findings to suggest acute cholecystitis. Correlate with right upper quadrant abdominal sonography as clinically warranted. 3. Chronic solid left adnexal mass, stable since 2015, differential includes asymmetrically prominent left ovary or exophytic left fundal fibroid, correlate with pelvic sonography on a short term outpatient basis as clinically warranted. 4. Worsened degenerative disc disease at L3-4. 5. Additional findings include coronary atherosclerosis and small hiatal hernia. Electronically Signed   By: Delbert Phenix M.D.   On: 07/15/2015 23:51    EKG:   Orders placed or performed during the hospital encounter of 07/15/15  . ED EKG  . ED EKG  . ED EKG  . ED EKG  . EKG 12-Lead  . EKG 12-Lead    ASSESSMENT AND PLAN:   50 year old female patient with history of end-stage renal disease on peritoneal dialysis, hypertension, coronary artery disease, congestive heart failure presented to the emergency room with nausea and vomiting and abdominal discomfort.  # Acute peritonitis with nausea vomiting and diffuse abdominal pain. Peritoneal fluid with neutrophils greater than 70% Continue IV antibiotics levofloxacin and vancomycin is added to the regimen. Follow-up the blood cultures and peritoneal fluid culture results as stated results. Discussed with gastroenterology and nephrology. Provided symptomatic treatment with anti-emetics and pain medications as needed  #. End-stage renal disease on peritoneal dialysis Continue peritoneal dialysis as recommended by nephrology. Nephrology consult is placed and discussed with Dr. Thedore Mins. Not considering to change peritoneal dialysis catheter at this  time.  #. Mildly elevated troponin which could be secondary to renal failure Initial troponin 0.05. We will cycle cardiac biomarkers Patient denies any chest pain.  #. Hypertension Continue Coreg and  Norvasc as tolerated  #  Congestive heart failure continue home medication aspirin 81 mg, Plavix 75 mg, Coreg and amlodipine. Continue statin   DVT prophylaxis subcutaneous heparin.     All the records are reviewed and case discussed with Care Management/Social Workerr. Management plans discussed with the patient, and she is in agreement. Discussed with GI Dr. Bluford Kaufmann and nephrology Dr. Thedore Mins  CODE STATUS: fc   TOTAL TIME TAKING CARE OF THIS PATIENT: 35  minutes.   POSSIBLE D/C IN 3-4 DAYS, DEPENDING ON CLINICAL CONDITION.   Ramonita Lab M.D on 07/16/2015 at 12:32 PM  Between 7am to 6pm - Pager - [phone] After 6pm go to [email or web address] - password [email or web address] [facility]  [facility] [facility]  Office  [phone]  CC: Primary care physician; No primary care provider on file.

## 2015-07-17 LAB — BODY FLUID CELL COUNT WITH DIFFERENTIAL
Eos, Fluid: 0 %
Lymphs, Fluid: 0 %
MONOCYTE-MACROPHAGE-SEROUS FLUID: 9 %
Neutrophil Count, Fluid: 91 %
OTHER CELLS FL: 0 %
Total Nucleated Cell Count, Fluid: 193 cu mm

## 2015-07-17 LAB — GLUCOSE, CAPILLARY
GLUCOSE-CAPILLARY: 138 mg/dL — AB (ref 65–99)
Glucose-Capillary: 170 mg/dL — ABNORMAL HIGH (ref 65–99)
Glucose-Capillary: 116 mg/dL — ABNORMAL HIGH (ref 65–99)
Glucose-Capillary: 92 mg/dL (ref 65–99)

## 2015-07-17 LAB — BASIC METABOLIC PANEL
Anion gap: 11 (ref 5–15)
BUN: 77 mg/dL — AB (ref 6–20)
CALCIUM: 6.4 mg/dL — AB (ref 8.9–10.3)
CO2: 24 mmol/L (ref 22–32)
CREATININE: 9.51 mg/dL — AB (ref 0.44–1.00)
Chloride: 100 mmol/L — ABNORMAL LOW (ref 101–111)
GFR, EST AFRICAN AMERICAN: 5 mL/min — AB (ref >60)
GFR, EST NON AFRICAN AMERICAN: 4 mL/min — AB (ref >60)
Glucose, Bld: 145 mg/dL — ABNORMAL HIGH (ref 65–99)
Potassium: 4 mmol/L (ref 3.5–5.1)
SODIUM: 135 mmol/L (ref 135–145)

## 2015-07-17 LAB — CBC
HCT: 28.2 % — ABNORMAL LOW (ref 35.0–47.0)
Hemoglobin: 9.4 g/dL — ABNORMAL LOW (ref 12.0–16.0)
MCH: 27.9 pg (ref 26.0–34.0)
MCHC: 33.2 g/dL (ref 32.0–36.0)
MCV: 84 fL (ref 80.0–100.0)
PLATELETS: 289 10*3/uL (ref 150–440)
RBC: 3.36 MIL/uL — ABNORMAL LOW (ref 3.80–5.20)
RDW: 14.4 % (ref 11.5–14.5)
WBC: 8.8 10*3/uL (ref 3.6–11.0)

## 2015-07-17 LAB — PATHOLOGIST SMEAR REVIEW

## 2015-07-17 MED ORDER — GENTAMICIN SULFATE 0.1 % EX CREA: 1.0000 "application " | TOPICAL_CREAM | Freq: Every day | CUTANEOUS | Status: DC

## 2015-07-17 MED ORDER — ERYTHROMYCIN BASE 250 MG PO TBEC
250.0000 mg | DELAYED_RELEASE_TABLET | Freq: Four times a day (QID) | ORAL | Status: DC
  Administered 2015-07-17 – 2015-07-19 (×8): 250 mg via ORAL
  Filled 2015-07-17 (×14): qty 1

## 2015-07-17 MED ORDER — INSULIN ASPART 100 UNIT/ML ~~LOC~~ SOLN
0.0000 [IU] | Freq: Every day | SUBCUTANEOUS | Status: DC
  Administered 2015-07-20 – 2015-07-23 (×2): 2 [IU] via SUBCUTANEOUS
  Filled 2015-07-17 (×2): qty 2

## 2015-07-17 MED ORDER — PANTOPRAZOLE SODIUM 40 MG PO TBEC
40.0000 mg | DELAYED_RELEASE_TABLET | Freq: Two times a day (BID) | ORAL | Status: DC
  Administered 2015-07-17 – 2015-08-01 (×27): 40 mg via ORAL
  Filled 2015-07-17 (×28): qty 1

## 2015-07-17 MED ORDER — SODIUM CHLORIDE 0.9 % IV SOLN
250.0000 mg | Freq: Four times a day (QID) | INTRAVENOUS | Status: DC
  Filled 2015-07-17 (×2): qty 5

## 2015-07-17 MED ORDER — INSULIN ASPART 100 UNIT/ML ~~LOC~~ SOLN
0.0000 [IU] | Freq: Three times a day (TID) | SUBCUTANEOUS | Status: DC
  Administered 2015-07-17: 2 [IU] via SUBCUTANEOUS
  Administered 2015-07-18 – 2015-07-19 (×6): 1 [IU] via SUBCUTANEOUS
  Administered 2015-07-20 (×3): 2 [IU] via SUBCUTANEOUS
  Administered 2015-07-21: 1 [IU] via SUBCUTANEOUS
  Administered 2015-07-22: 2 [IU] via SUBCUTANEOUS
  Administered 2015-07-22 – 2015-07-24 (×4): 1 [IU] via SUBCUTANEOUS
  Administered 2015-07-25: 3 [IU] via SUBCUTANEOUS
  Administered 2015-07-26 – 2015-07-29 (×3): 1 [IU] via SUBCUTANEOUS
  Administered 2015-07-30: 2 [IU] via SUBCUTANEOUS
  Administered 2015-08-01: 1 [IU] via SUBCUTANEOUS
  Filled 2015-07-17 (×4): qty 1
  Filled 2015-07-17 (×2): qty 2
  Filled 2015-07-17: qty 1
  Filled 2015-07-17 (×2): qty 2
  Filled 2015-07-17: qty 1
  Filled 2015-07-17: qty 3
  Filled 2015-07-17 (×4): qty 1
  Filled 2015-07-17: qty 2
  Filled 2015-07-17 (×3): qty 1
  Filled 2015-07-17: qty 2
  Filled 2015-07-17 (×2): qty 1

## 2015-07-17 MED ORDER — DELFLEX-LC/2.5% DEXTROSE 394 MOSM/L IP SOLN
INTRAPERITONEAL | Status: DC
  Administered 2015-07-21: 6 L via INTRAPERITONEAL
  Administered 2015-07-22: 12:00:00 via INTRAPERITONEAL
  Administered 2015-07-23: 6 L via INTRAPERITONEAL
  Administered 2015-07-25: 9 L via INTRAPERITONEAL
  Filled 2015-07-17 (×7): qty 3000

## 2015-07-17 NOTE — Progress Notes (Signed)
[facility] [place] [facility] - [place] at [facility]   PATIENT NAME: Anwita Mencer    MR#:  [id number]  DATE OF BIRTH:  June 17, 1965  SUBJECTIVE:  CHIEF COMPLAINT: Patient denies nausea and vomiting today. Reporting improving generalized abdominal pain. Asking to advance her diet      REVIEW OF SYSTEMS:  CONSTITUTIONAL: No fever, fatigue or weakness.  EYES: No blurred or double vision.  EARS, NOSE, AND THROAT: No tinnitus or ear pain.  RESPIRATORY: No cough, shortness of breath, wheezing or hemoptysis.  CARDIOVASCULAR: No chest pain, orthopnea, edema.  GASTROINTESTINAL: Reporting improved  nausea, vomiting,  denies diarrhea .  generalized abdominal pain is better GENITOURINARY: No dysuria, hematuria.  ENDOCRINE: No polyuria, nocturia,  HEMATOLOGY: No anemia, easy bruising or bleeding SKIN: No rash or lesion. MUSCULOSKELETAL: No joint pain or arthritis.   NEUROLOGIC: No tingling, numbness, weakness.  PSYCHIATRY: No anxiety or depression.   DRUG ALLERGIES:   Allergies  Allergen Reactions  . Cephalosporins Anaphylaxis  . Penicillins Anaphylaxis and Other (See Comments)    Has patient had a PCN reaction causing immediate rash, facial/tongue/throat swelling, SOB or lightheadedness with hypotension: Yes Has patient had a PCN reaction causing severe rash involving mucus membranes or skin necrosis: No Has patient had a PCN reaction that required hospitalization No Has patient had a PCN reaction occurring within the last 10 years: No If all of the above answers are "NO", then may proceed with Cephalosporin use.  [name] Phenergan [Promethazine Hcl] Nausea And Vomiting    VITALS:  Blood pressure 136/62, pulse 78, temperature 97.9 F (36.6 C), temperature source Oral, resp. rate 18, height 5' 9.02" (1.753 m), weight 123.106 kg (271 lb 6.4 oz), last menstrual period 03/16/2015, SpO2 97 %.  PHYSICAL EXAMINATION:  GENERAL:  50 y.o.-year-old patient lying in the bed with no acute  distress.  EYES: Pupils equal, round, reactive to light and accommodation. No scleral icterus. Extraocular muscles intact.  HEENT: Head atraumatic, normocephalic. Oropharynx and nasopharynx clear.  NECK:  Supple, no jugular venous distention. No thyroid enlargement, no tenderness.  LUNGS: Normal breath sounds bilaterally, no wheezing, rales,rhonchi or crepitation. No use of accessory muscles of respiration.  CARDIOVASCULAR: S1, S2 normal. No murmurs, rubs, or gallops.  ABDOMEN: Soft, diffuse min  tender. Peritoneal dialysis catheter is intact. No rebound tenderness., nondistended. Bowel sounds present. No organomegaly or mass.  EXTREMITIES: No pedal edema, cyanosis, or clubbing.  NEUROLOGIC: Cranial nerves II through XII are intact. Muscle strength 5/5 in all extremities. Sensation intact. Gait not checked.  PSYCHIATRIC: The patient is alert and oriented x 3.  SKIN: No obvious rash, lesion, or ulcer.    LABORATORY PANEL:   CBC  Recent Labs Lab 07/17/15 0450  WBC 8.8  HGB 9.4*  HCT 28.2*  PLT 289   ------------------------------------------------------------------------------------------------------------------  Chemistries   Recent Labs Lab 07/15/15 1639  07/17/15 0450  NA 136  --  135  K 4.5  --  4.0  CL 100*  --  100*  CO2 20*  --  24  GLUCOSE 155*  --  145*  BUN 92*  --  77*  CREATININE 10.47*  < > 9.51*  CALCIUM 6.2*  --  6.4*  AST 16  --   --   ALT 13*  --   --   ALKPHOS 102  --   --   BILITOT 0.6  --   --   < > = values in this interval not displayed. ------------------------------------------------------------------------------------------------------------------  Cardiac Enzymes  Recent  Labs Lab 07/15/15 2111  TROPONINI 0.05*   ------------------------------------------------------------------------------------------------------------------  RADIOLOGY:  Ct Abdomen Pelvis Wo Contrast  07/15/2015  CLINICAL DATA:  Nausea, vomiting and diarrhea with  abdominal pain for 4 days. End-stage renal disease. EXAM: CT ABDOMEN AND PELVIS WITHOUT CONTRAST TECHNIQUE: Multidetector CT imaging of the abdomen and pelvis was performed following the standard protocol without IV contrast. COMPARISON:  11/13/2013 CT abdomen/pelvis. FINDINGS: Lower chest: No significant pulmonary nodules or acute consolidative airspace disease. Coronary atherosclerosis. Coarse mitral annular calcification. Hepatobiliary: Normal liver with no liver mass. Gallbladder is filled with high density material. No gallbladder wall thickening or pericholecystic fluid. No biliary ductal dilatation. Pancreas: Normal, with no mass or duct dilation. Spleen: Normal size. No mass. Adrenals/Urinary Tract: Normal adrenals. Calcifications in the bilateral renal sinuses are favored to represent vascular calcifications. No hydronephrosis. No contour deforming renal masses. The normal caliber ureters, with no ureteral stones. Normal bladder. Stomach/Bowel: Small hiatal hernia. Otherwise grossly normal stomach. Normal caliber small bowel with no small bowel wall thickening. Normal appendix Normal large bowel with no diverticulosis, large bowel wall thickening or pericolonic fat stranding. Vascular/Lymphatic: Atherosclerotic nonaneurysmal abdominal aorta. No pathologically enlarged lymph nodes in the abdomen or pelvis. Reproductive: There is a 3.6 x 2.2 cm solid left adnexal mass (series 2/ image 71), stable since 11/13/2013 CT study, which could represent an asymmetrically prominent left ovary or less likely an exophytic left fundal uterine fibroid. No additional adnexal masses. Other: No pneumoperitoneum, ascites or focal fluid collection. Ventral right lower abdominal approach percutaneous catheter terminates in the anterior left upper pelvis. Musculoskeletal: No aggressive appearing focal osseous lesions. Moderate degenerative changes in the visualized thoracolumbar spine. Prominent enlarged Schmorl's nodes at L3-4.  Mild anasarca. IMPRESSION: 1. No acute abnormality. No evidence of bowel obstruction or acute bowel inflammation. Normal appendix. 2. Gallbladder is filled with nonspecific high density material, which could represent vicarious excretion of IV contrast if the patient has received a recent dose of IV contrast, or less likely could represent sludge, stones or hemorrhage. No CT findings to suggest acute cholecystitis. Correlate with right upper quadrant abdominal sonography as clinically warranted. 3. Chronic solid left adnexal mass, stable since 2015, differential includes asymmetrically prominent left ovary or exophytic left fundal fibroid, correlate with pelvic sonography on a short term outpatient basis as clinically warranted. 4. Worsened degenerative disc disease at L3-4. 5. Additional findings include coronary atherosclerosis and small hiatal hernia. Electronically Signed   By: Delbert Phenix M.D.   On: 07/15/2015 23:51    EKG:   Orders placed or performed during the hospital encounter of 07/15/15  . ED EKG  . ED EKG  . ED EKG  . ED EKG  . EKG 12-Lead  . EKG 12-Lead    ASSESSMENT AND PLAN:   50 year old female patient with history of end-stage renal disease on peritoneal dialysis, hypertension, coronary artery disease, congestive heart failure presented to the emergency room with nausea and vomiting and abdominal discomfort.  # Acute peritonitis with nausea vomiting and diffuse abdominal pain. Peritoneal fluid with neutrophils greater than 70% Continue IV antibiotics levofloxacin and vancomycin  Follow-up the blood cultures and peritoneal fluid cultures neg so far Discussed with gastroenterology and nephrology. Provided symptomatic treatment with anti-emetics and pain medications as needed  #. End-stage renal disease on peritoneal dialysis Continue peritoneal dialysis as recommended by nephrology. discussed with Dr. Wynelle Link Not considering to change peritoneal dialysis catheter at this  time.  # Gastroparesis  PPI BID , erythromycin 250 qid  #.  Mildly elevated troponin which could be secondary to renal failure Initial troponin 0.05. We will cycle cardiac biomarkers Patient denies any chest pain.  #. Hypertension Continue Coreg and Norvasc as tolerated  #  Congestive heart failure continue home medication aspirin 81 mg, Plavix 75 mg, Coreg and amlodipine. Continue statin   DVT prophylaxis subcutaneous heparin.     All the records are reviewed and case discussed with Care Management/Social Workerr. Management plans discussed with the patient, and she is in agreement. Discussed with GI Dr. Bluford Kaufmann and nephrology Dr. Thedore Mins  CODE STATUS: fc   TOTAL TIME TAKING CARE OF THIS PATIENT: 35  minutes.   POSSIBLE D/C IN 1-2 DAYS, DEPENDING ON CLINICAL CONDITION.   Ramonita Lab M.D on 07/17/2015 at 6:59 PM  Between 7am to 6pm - Pager - [phone] After 6pm go to [email or web address] - password [email or web address] [facility]  [place] [facility]  Office  [phone]  CC: Primary care physician; No primary care provider on file.

## 2015-07-17 NOTE — Progress Notes (Signed)
Pharmacy Note - Drug interactions  Patient to start erythromycin 250mg  QID for gastroparesis. There are several drug interactions with erythromycin and current orders. Discussed with Dr. Bluford Kaufmann. Patient apparently takes erythromycin at home, although it is not on PTA medication list.   Will discontinue simvastatin at this time as concurrent use is contraindicated.   Also recommend monitoring QTc while on erythromycin and levofloxacin due to increased risk of QTc prolongation  Erythromycin will also increase AUC of ranolazine, no change needed. Monitor for increased effects/toxicity.   Kimberly Montgomery, PharmD Clinical Pharmacist  07/17/2015 2:21 PM

## 2015-07-17 NOTE — Care Management (Signed)
Acute peritonitis, N&V. Peritoneal dialysis. [facility]. Kimberly Montgomery. CM met with patient at bedside. She is pale and weak but sitting up in chair. Lives alone Current with PCP. Denies issues accessing medical care or copays. Her daughter assists her with transportation. Patient may benefit from nursing at discharge. Will monitor progression. She prefers [facility] if [place] needed.

## 2015-07-17 NOTE — Consult Note (Signed)
GI Inpatient Follow-up Note  Patient Identification: Kimberly Montgomery is a 50 y.o. female  Subjective: Overall better. Less abdominal pain. Diarrhea has stopped. Tolerating clears. Normally takes prilosec 40mg  bid plus erythromycin 250 qid. Wants to try solids.  Scheduled Inpatient Medications:  . amLODipine  10 mg Oral Daily  . aspirin EC  81 mg Oral Daily  . buPROPion  150 mg Oral Daily  . carvedilol  6.25 mg Oral BID WC  . clopidogrel  75 mg Oral Daily  . dialysis solution 2.5% low-MG/low-CA   Intraperitoneal Q24H  . dialysis solution 2.5% low-MG/low-CA   Intraperitoneal Q24H  . erythromycin  250 mg Intravenous 4 times per day  . ezetimibe  10 mg Oral QHS   And  . simvastatin  40 mg Oral QHS  . gabapentin  600 mg Oral TID  . gentamicin cream  1 application Topical Daily  . heparin  5,000 Units Subcutaneous 3 times per day  . insulin aspart  0-5 Units Subcutaneous QHS  . insulin aspart  0-9 Units Subcutaneous TID WC  . isosorbide mononitrate  60 mg Oral Daily  . levofloxacin (LEVAQUIN) IV  500 mg Intravenous Q48H  . lisinopril  40 mg Oral Daily  . pantoprazole  40 mg Oral BID  . ranolazine  500 mg Oral BID  . sodium chloride flush  3 mL Intravenous Q12H    Continuous Inpatient Infusions:     PRN Inpatient Medications:  sodium chloride, acetaminophen **OR** acetaminophen, albuterol, docusate sodium, heparin, morphine injection, ondansetron **OR** ondansetron (ZOFRAN) IV, polyethylene glycol, prochlorperazine, sodium chloride flush  Review of Systems: Constitutional: Weight is stable.  Eyes: No changes in vision. ENT: No oral lesions, sore throat.  GI: see HPI.  Heme/Lymph: No easy bruising.  CV: No chest pain.  GU: No hematuria.  Integumentary: No rashes.  Neuro: No headaches.  Psych: No depression/anxiety.  Endocrine: No heat/cold intolerance.  Allergic/Immunologic: No urticaria.  Resp: No cough, SOB.  Musculoskeletal: No joint swelling.    Physical  Examination: BP 136/62 mmHg  Pulse 78  Temp(Src) 97.9 F (36.6 C) (Oral)  Resp 18  Ht 5' 9.02" (1.753 m)  Wt 123.106 kg (271 lb 6.4 oz)  BMI 40.06 kg/m2  SpO2 97%  LMP 03/16/2015 Gen: NAD, alert and oriented x 4 HEENT: PEERLA, EOMI, Neck: supple, no JVD or thyromegaly Chest: CTA bilaterally, no wheezes, crackles, or other adventitious sounds CV: RRR, no m/g/c/r Abd: soft,mild diffuse tenderness, ND, +BS in all four quadrants; no HSM, guarding, ridigity, or rebound tenderness Ext: no edema, well perfused with 2+ pulses, Skin: no rash or lesions noted Lymph: no LAD  Data: Lab Results  Component Value Date   WBC 8.8 07/17/2015   HGB 9.4* 07/17/2015   HCT 28.2* 07/17/2015   MCV 84.0 07/17/2015   PLT 289 07/17/2015    Recent Labs Lab 07/15/15 1639 07/16/15 0803 07/17/15 0450  HGB 10.7* 9.4* 9.4*   Lab Results  Component Value Date   NA 135 07/17/2015   K 4.0 07/17/2015   CL 100* 07/17/2015   CO2 24 07/17/2015   BUN 77* 07/17/2015   CREATININE 9.51* 07/17/2015   Lab Results  Component Value Date   ALT 13* 07/15/2015   AST 16 07/15/2015   ALKPHOS 102 07/15/2015   BILITOT 0.6 07/15/2015   No results for input(s): APTT, INR, PTT in the last 168 hours. Assessment/Plan: Ms. Hanken is a 50 y.o. female with peritonitis on Abx. Hx of gastroparesis.   Recommendations: Increase  protonix to bid. Start e-mycin 250mg  qid. Since low residue not in order set, will order soft diet for now. Thanks. Please call with questions or concerns.  Wisdom Seybold, Ezzard Standing, MD

## 2015-07-17 NOTE — Progress Notes (Signed)
Subjective:  Peritoneal dialysis last night UF - . Tolerated treatment well.   Tolerating clear liquid diet  Objective:  Vital signs in last 24 hours:  Temp:  [97.4 F (36.3 C)-98.6 F (37 C)] 97.9 F (36.6 C) (04/17 1126) Pulse Rate:  [75-92] 78 (04/17 1126) Resp:  [18-22] 18 (04/17 1126) BP: (136-171)/(59-88) 136/62 mmHg (04/17 1126) SpO2:  [95 %-100 %] 97 % (04/17 1126) Weight:  [123.106 kg (271 lb 6.4 oz)] 123.106 kg (271 lb 6.4 oz) (04/17 0443)  Weight change: 0.68 kg (1 lb 8 oz) Filed Weights   07/15/15 1634 07/16/15 0751 07/17/15 0443  Weight: 117.935 kg (260 lb) 118.616 kg (261 lb 8 oz) 123.106 kg (271 lb 6.4 oz)    Intake/Output:    Intake/Output Summary (Last 24 hours) at 07/17/15 1130 Last data filed at 07/17/15 1015  Gross per 24 hour  Intake 2231.25 ml  Output    850 ml  Net 1381.25 ml     Physical Exam: General: NAD  HEENT Anicteric, moist oral mucous membranes  Neck supple  Pulm/lungs Normal effort, clear to auscultation bilaterally  CVS/Heart No rub or gallop  Abdomen:  Soft, diffuse abdominal tenderness,  Extremities: +  peripheral edema, right BKA  Neurologic: Alert, oriented, speech normal  Skin: No acute rashes  Access: PD catheter,exit site nontender       Basic Metabolic Panel:   Recent Labs Lab 07/11/15 1702 07/15/15 1639 07/16/15 0803 07/17/15 0450  NA  --  136  --  135  K  --  4.5  --  4.0  CL  --  100*  --  100*  CO2  --  20*  --  24  GLUCOSE  --  155*  --  145*  BUN  --  92*  --  77*  CREATININE  --  10.47* 9.44* 9.51*  CALCIUM  --  6.2*  --  6.4*  PHOS 11.3*  --   --   --      CBC:  Recent Labs Lab 07/12/15 0429 07/15/15 1639 07/16/15 0803 07/17/15 0450  WBC 14.3* 9.7 10.8 8.8  HGB 9.3* 10.7* 9.4* 9.4*  HCT 28.4* 31.8* 29.0* 28.2*  MCV 84.6 83.8 83.8 84.0  PLT 342 352 322 289      Microbiology:  Recent Results (from the past 720 hour(s))  Body fluid culture     Status: None (Preliminary result)    Collection Time: 07/15/15 10:23 PM  Result Value Ref Range Status   Specimen Description PERITONEAL  Final   Special Requests Normal  Final   Gram Stain RARE WBC SEEN NO ORGANISMS SEEN   Final   Culture NO GROWTH 2 DAYS  Final   Report Status PENDING  Incomplete  Culture, blood (routine x 2)     Status: None (Preliminary result)   Collection Time: 07/16/15  3:58 AM  Result Value Ref Range Status   Specimen Description BLOOD RIGHT WRIST  Final   Special Requests   Final    BOTTLES DRAWN AEROBIC AND ANAEROBIC 12CCAERO,6CCANA   Culture NO GROWTH 1 DAY  Final   Report Status PENDING  Incomplete  Culture, blood (routine x 2)     Status: None (Preliminary result)   Collection Time: 07/16/15  3:58 AM  Result Value Ref Range Status   Specimen Description BLOOD RIGHT FOREARM  Final   Special Requests   Final    BOTTLES DRAWN AEROBIC AND ANAEROBIC 12CCAERO,2CCANA   Culture NO GROWTH 1 DAY  Final   Report Status PENDING  Incomplete    Coagulation Studies: No results for input(s): LABPROT, INR in the last 72 hours.  Urinalysis:  Recent Labs  07/15/15 2223  COLORURINE YELLOW*  LABSPEC 1.015  PHURINE 6.0  GLUCOSEU >500*  HGBUR 1+*  BILIRUBINUR NEGATIVE  KETONESUR TRACE*  PROTEINUR >500*  NITRITE NEGATIVE  LEUKOCYTESUR NEGATIVE      Imaging: Ct Abdomen Pelvis Wo Contrast  07/15/2015  CLINICAL DATA:  Nausea, vomiting and diarrhea with abdominal pain for 4 days. End-stage renal disease. EXAM: CT ABDOMEN AND PELVIS WITHOUT CONTRAST TECHNIQUE: Multidetector CT imaging of the abdomen and pelvis was performed following the standard protocol without IV contrast. COMPARISON:  11/13/2013 CT abdomen/pelvis. FINDINGS: Lower chest: No significant pulmonary nodules or acute consolidative airspace disease. Coronary atherosclerosis. Coarse mitral annular calcification. Hepatobiliary: Normal liver with no liver mass. Gallbladder is filled with high density material. No gallbladder wall  thickening or pericholecystic fluid. No biliary ductal dilatation. Pancreas: Normal, with no mass or duct dilation. Spleen: Normal size. No mass. Adrenals/Urinary Tract: Normal adrenals. Calcifications in the bilateral renal sinuses are favored to represent vascular calcifications. No hydronephrosis. No contour deforming renal masses. The normal caliber ureters, with no ureteral stones. Normal bladder. Stomach/Bowel: Small hiatal hernia. Otherwise grossly normal stomach. Normal caliber small bowel with no small bowel wall thickening. Normal appendix Normal large bowel with no diverticulosis, large bowel wall thickening or pericolonic fat stranding. Vascular/Lymphatic: Atherosclerotic nonaneurysmal abdominal aorta. No pathologically enlarged lymph nodes in the abdomen or pelvis. Reproductive: There is a 3.6 x 2.2 cm solid left adnexal mass (series 2/ image 71), stable since 11/13/2013 CT study, which could represent an asymmetrically prominent left ovary or less likely an exophytic left fundal uterine fibroid. No additional adnexal masses. Other: No pneumoperitoneum, ascites or focal fluid collection. Ventral right lower abdominal approach percutaneous catheter terminates in the anterior left upper pelvis. Musculoskeletal: No aggressive appearing focal osseous lesions. Moderate degenerative changes in the visualized thoracolumbar spine. Prominent enlarged Schmorl's nodes at L3-4. Mild anasarca. IMPRESSION: 1. No acute abnormality. No evidence of bowel obstruction or acute bowel inflammation. Normal appendix. 2. Gallbladder is filled with nonspecific high density material, which could represent vicarious excretion of IV contrast if the patient has received a recent dose of IV contrast, or less likely could represent sludge, stones or hemorrhage. No CT findings to suggest acute cholecystitis. Correlate with right upper quadrant abdominal sonography as clinically warranted. 3. Chronic solid left adnexal mass, stable  since 2015, differential includes asymmetrically prominent left ovary or exophytic left fundal fibroid, correlate with pelvic sonography on a short term outpatient basis as clinically warranted. 4. Worsened degenerative disc disease at L3-4. 5. Additional findings include coronary atherosclerosis and small hiatal hernia. Electronically Signed   By: Delbert Phenix M.D.   On: 07/15/2015 23:51     Medications:     . amLODipine  10 mg Oral Daily  . aspirin EC  81 mg Oral Daily  . buPROPion  150 mg Oral Daily  . carvedilol  6.25 mg Oral BID WC  . clopidogrel  75 mg Oral Daily  . dialysis solution 2.5% low-MG/low-CA   Intraperitoneal Q24H  . ezetimibe  10 mg Oral QHS   And  . simvastatin  40 mg Oral QHS  . gabapentin  600 mg Oral TID  . gentamicin cream  1 application Topical Daily  . heparin  5,000 Units Subcutaneous 3 times per day  . insulin aspart  0-5 Units Subcutaneous QHS  .  insulin aspart  0-9 Units Subcutaneous TID WC  . isosorbide mononitrate  60 mg Oral Daily  . levofloxacin (LEVAQUIN) IV  500 mg Intravenous Q48H  . lisinopril  40 mg Oral Daily  . pantoprazole  40 mg Oral Daily  . ranolazine  500 mg Oral BID  . sodium chloride flush  3 mL Intravenous Q12H   sodium chloride, acetaminophen **OR** acetaminophen, albuterol, docusate sodium, heparin, morphine injection, ondansetron **OR** ondansetron (ZOFRAN) IV, polyethylene glycol, prochlorperazine, sodium chloride flush  Assessment/ Plan:  50 y.o. white  female with past medical history of end-stage renal disease on peritoneal dialysis, asthma, gastroparesis, diabetes mellitus, congestive heart failure, hypertension, endometriosis, coronary atherosclerosis, medical management  [facility]/[address]/peritoneal dialysis  1. End-stage renal disease on peritoneal dialysis. Home PD prescription of 3 exchanges 8 hours with 2.2 litre fills.   2. Acute peritonitis in PD patient. WBC cell count 690 with 73% neutrophils - PD  fluid culture is pending - Empiric treatment with broad-spectrum antibiotics vancomycin and Levaquin  3. Anemia chronic kidney disease.hemoglobin 9.4 - epo as outpatient.   4. Secondary hyperparathyroidism. phos elevated at 11.3 on 4/11. Not currently on a binder. Takes calcium acetate as outpatient.      LOS: 1 Kimberly Montgomery 4/17/201711:30 AM

## 2015-07-18 LAB — GLUCOSE, CAPILLARY
GLUCOSE-CAPILLARY: 162 mg/dL — AB (ref 65–99)
GLUCOSE-CAPILLARY: 121 mg/dL — AB (ref 65–99)
Glucose-Capillary: 138 mg/dL — ABNORMAL HIGH (ref 65–99)
Glucose-Capillary: 149 mg/dL — ABNORMAL HIGH (ref 65–99)
Glucose-Capillary: 125 mg/dL — ABNORMAL HIGH (ref 65–99)

## 2015-07-18 LAB — HEMOGLOBIN A1C: HEMOGLOBIN A1C: 8.6 % — AB (ref 4.0–6.0)

## 2015-07-18 MED ORDER — ERYTHROMYCIN BASE 250 MG PO TBEC: 250.0000 mg | DELAYED_RELEASE_TABLET | Freq: Four times a day (QID) | ORAL | Status: DC

## 2015-07-18 MED ORDER — LEVOFLOXACIN 500 MG PO TABS
500.0000 mg | ORAL_TABLET | ORAL | Status: DC
  Administered 2015-07-19: 500 mg via ORAL
  Filled 2015-07-18 (×2): qty 1

## 2015-07-18 MED ORDER — LEVOFLOXACIN 500 MG PO TABS: 500.0000 mg | ORAL_TABLET | ORAL | Status: DC

## 2015-07-18 MED ORDER — SODIUM CHLORIDE 0.9 % IV BOLUS (SEPSIS)
250.0000 mL | Freq: Once | INTRAVENOUS | Status: AC
  Administered 2015-07-18: 250 mL via INTRAVENOUS

## 2015-07-18 NOTE — Discharge Summary (Deleted)
[facility] [place] [facility]   PATIENT NAME: Kimberly Montgomery    MR#:  [id number]  DATE OF BIRTH:  Aug 29, 1965  DATE OF ADMISSION:  07/15/2015 ADMITTING PHYSICIAN: Ihor Austin, MD  DATE OF DISCHARGE: 07/18/15  PRIMARY CARE PHYSICIAN: No primary care provider on file.    ADMISSION DIAGNOSIS:  SBP (spontaneous bacterial peritonitis) (HCC) [K65.2] Vomiting and diarrhea [R11.10, R19.7] ESRD on peritoneal dialysis (HCC) [N18.6, Z99.2] Type 2 diabetes mellitus with chronic kidney disease on chronic dialysis, unspecified long term insulin use status (HCC) [E11.22, N18.6]  DISCHARGE DIAGNOSIS:  SBP ESRD ON Peritoneal dialysis Dm-2 H/o chronic gastroparesis  SECONDARY DIAGNOSIS:   Past Medical History  Diagnosis Date  . Renal disorder   . Hypertension   . CHF (congestive heart failure) (HCC)   . Diabetes mellitus without complication (HCC)   . Gastroparesis   . Asthma   . ESRD (end stage renal disease) (HCC)   . Endometriosis   . CAD (coronary artery disease)   . HLD (hyperlipidemia)   . GERD (gastroesophageal reflux disease)     HOSPITAL COURSE:   50 year old female patient with history of end-stage renal disease on peritoneal dialysis, hypertension, coronary artery disease, congestive heart failure presented to the emergency room with nausea and vomiting and abdominal discomfort.  # Acute peritonitis with nausea vomiting and diffuse abdominal pain. Peritoneal fluid with neutrophils greater than 70% Continue IV antibiotics levofloxacin. No need for vanc per nephrology. -change to po levaquin -repeat PD fluid clearing. No fever. PD fluid cx neg Discussed with gastroenterology and nephrology. Provided symptomatic treatment with anti-emetics and pain medications as needed  #. End-stage renal disease on peritoneal dialysis Continue peritoneal dialysis as recommended by nephrology. discussed with Dr. Wynelle Link Not considering to change  peritoneal dialysis catheter at this time.  # Gastroparesis  PPI BID , erythromycin 250 qid Tolerating po diet  #. Mildly elevated troponin which could be secondary to renal failure Initial troponin 0.05. We will cycle cardiac biomarkers Patient denies any chest pain.  #. Hypertension Continue Coreg and Norvasc as tolerated  # Congestive heart failure, chronic, diastolic - continue home medication aspirin 81 mg, Plavix 75 mg, Coreg and amlodipine. - Continue statin   DVT prophylaxis subcutaneous heparin.  # mild jerks ?unlcear voluntary or involuntary  Overall improving Vitals stable CONSULTS OBTAINED:  Treatment Team:  Mosetta Pigeon, MD Wallace Cullens, MD  DRUG ALLERGIES:   Allergies  Allergen Reactions  . Cephalosporins Anaphylaxis  . Penicillins Anaphylaxis and Other (See Comments)    Has patient had a PCN reaction causing immediate rash, facial/tongue/throat swelling, SOB or lightheadedness with hypotension: Yes Has patient had a PCN reaction causing severe rash involving mucus membranes or skin necrosis: No Has patient had a PCN reaction that required hospitalization No Has patient had a PCN reaction occurring within the last 10 years: No If all of the above answers are "NO", then may proceed with Cephalosporin use.  [name] Phenergan [Promethazine Hcl] Nausea And Vomiting    DISCHARGE MEDICATIONS:   Current Discharge Medication List    START taking these medications   Details  erythromycin (ERY-TAB) 250 MG EC tablet Take 1 tablet (250 mg total) by mouth 4 (four) times daily. Qty: 120 tablet, Refills: 1    levofloxacin (LEVAQUIN) 500 MG tablet Take 1 tablet (500 mg total) by mouth every other day. Qty: 5 tablet, Refills: 0      CONTINUE these medications which have CHANGED   Details  ondansetron (  ZOFRAN ODT) 4 MG disintegrating tablet Take 1 tablet (4 mg total) by mouth every 6 (six) hours as needed for nausea or vomiting. Qty: 20 tablet, Refills: 0       CONTINUE these medications which have NOT CHANGED   Details  acetaminophen (TYLENOL) 500 MG tablet Take 1,000 mg by mouth every 6 (six) hours as needed for mild pain.     albuterol (PROVENTIL HFA;VENTOLIN HFA) 108 (90 Base) MCG/ACT inhaler Inhale 2 puffs into the lungs every 6 (six) hours as needed for wheezing or shortness of breath.    amLODipine (NORVASC) 10 MG tablet Take 10 mg by mouth daily.    aspirin EC 81 MG tablet Take 81 mg by mouth daily.    aspirin-acetaminophen-caffeine (EXCEDRIN MIGRAINE) 250-250-65 MG tablet Take 2 tablets by mouth every 6 (six) hours as needed for headache.    buPROPion (WELLBUTRIN SR) 150 MG 12 hr tablet Take 150 mg by mouth daily.    calcium acetate, Phos Binder, (PHOSLYRA) 667 MG/5ML SOLN Take 2,668 mg by mouth 3 (three) times daily with meals.     carvedilol (COREG) 6.25 MG tablet Take 6.25 mg by mouth 2 (two) times daily with a meal.    clopidogrel (PLAVIX) 75 MG tablet Take 75 mg by mouth daily.    docusate sodium (COLACE) 100 MG capsule Take 100 mg by mouth at bedtime as needed for mild constipation.    ezetimibe-simvastatin (VYTORIN) 10-40 MG tablet Take 1 tablet by mouth at bedtime.    gabapentin (NEURONTIN) 600 MG tablet Take 600 mg by mouth 3 (three) times daily. Refills: 5    insulin aspart (NOVOLOG) 100 UNIT/ML injection Inject 15 Units into the skin 3 (three) times daily with meals.    insulin glargine (LANTUS) 100 UNIT/ML injection Inject 65 Units into the skin at bedtime.    isosorbide mononitrate (IMDUR) 60 MG 24 hr tablet Take 1 tablet (60 mg total) by mouth daily. Qty: 30 tablet, Refills: 00    lisinopril (PRINIVIL,ZESTRIL) 40 MG tablet Take 40 mg by mouth daily. Refills: 3    nitroGLYCERIN (NITROSTAT) 0.4 MG SL tablet Place 0.4 mg under the tongue every 5 (five) minutes as needed for chest pain. Reported on 07/10/2015    omeprazole (PRILOSEC) 40 MG capsule Take 40 mg by mouth 2 (two) times daily.    polyethylene glycol  (MIRALAX / GLYCOLAX) packet Take 17 g by mouth daily as needed for mild constipation.    ranolazine (RANEXA) 500 MG 12 hr tablet Take 1 tablet (500 mg total) by mouth 2 (two) times daily. Qty: 60 tablet, Refills: 0    torsemide (DEMADEX) 100 MG tablet Take 100 mg by mouth daily.        If you experience worsening of your admission symptoms, develop shortness of breath, life threatening emergency, suicidal or homicidal thoughts you must seek medical attention immediately by calling 911 or calling your MD immediately  if symptoms less severe.  You Must read complete instructions/literature along with all the possible adverse reactions/side effects for all the Medicines you take and that have been prescribed to you. Take any new Medicines after you have completely understood and accept all the possible adverse reactions/side effects.   Please note  You were cared for by a hospitalist during your hospital stay. If you have any questions about your discharge medications or the care you received while you were in the hospital after you are discharged, you can call the unit and asked to speak with the  hospitalist on call if the hospitalist that took care of you is not available. Once you are discharged, your primary care physician will handle any further medical issues. Please note that NO REFILLS for any discharge medications will be authorized once you are discharged, as it is imperative that you return to your primary care physician (or establish a relationship with a primary care physician if you do not have one) for your aftercare needs so that they can reassess your need for medications and monitor your lab values. Today   SUBJECTIVE   Mild nausea, some upper body jerks  VITAL SIGNS:  Blood pressure 130/57, pulse 70, temperature 98.2 F (36.8 C), temperature source Oral, resp. rate 18, height 5' 9.02" (1.753 m), weight 127.053 kg (280 lb 1.6 oz), last menstrual period 03/16/2015, SpO2 100  %.  I/O:   Intake/Output Summary (Last 24 hours) at 07/18/15 1042 Last data filed at 07/18/15 0947  Gross per 24 hour  Intake   1360 ml  Output   2212 ml  Net   -852 ml    PHYSICAL EXAMINATION:  GENERAL:  50 y.o.-year-old patient lying in the bed with no acute distress.  EYES: Pupils equal, round, reactive to light and accommodation. No scleral icterus. Extraocular muscles intact.  HEENT: Head atraumatic, normocephalic. Oropharynx and nasopharynx clear.  NECK:  Supple, no jugular venous distention. No thyroid enlargement, no tenderness.  LUNGS: Normal breath sounds bilaterally, no wheezing, rales,rhonchi or crepitation. No use of accessory muscles of respiration.  CARDIOVASCULAR: S1, S2 normal. No murmurs, rubs, or gallops.  ABDOMEN: Soft, non-tender, non-distended. Bowel sounds present. No organomegaly or mass. PD cath site looks ok EXTREMITIES: No pedal edema, cyanosis, or clubbing.  NEUROLOGIC: Cranial nerves II through XII are intact. Muscle strength 5/5 in all extremities. Sensation intact. Gait not checked.  PSYCHIATRIC: The patient is alert and oriented x 3.  SKIN: No obvious rash, lesion, or ulcer.   DATA REVIEW:   CBC   Recent Labs Lab 07/17/15 0450  WBC 8.8  HGB 9.4*  HCT 28.2*  PLT 289    Chemistries   Recent Labs Lab 07/15/15 1639  07/17/15 0450  NA 136  --  135  K 4.5  --  4.0  CL 100*  --  100*  CO2 20*  --  24  GLUCOSE 155*  --  145*  BUN 92*  --  77*  CREATININE 10.47*  < > 9.51*  CALCIUM 6.2*  --  6.4*  AST 16  --   --   ALT 13*  --   --   ALKPHOS 102  --   --   BILITOT 0.6  --   --   < > = values in this interval not displayed.  Microbiology Results   Recent Results (from the past 240 hour(s))  Body fluid culture     Status: None (Preliminary result)   Collection Time: 07/15/15 10:23 PM  Result Value Ref Range Status   Specimen Description PERITONEAL  Final   Special Requests Normal  Final   Gram Stain RARE WBC SEEN NO ORGANISMS  SEEN   Final   Culture NO GROWTH 3 DAYS  Final   Report Status PENDING  Incomplete  Culture, blood (routine x 2)     Status: None (Preliminary result)   Collection Time: 07/16/15  3:58 AM  Result Value Ref Range Status   Specimen Description BLOOD RIGHT WRIST  Final   Special Requests   Final    BOTTLES  DRAWN AEROBIC AND ANAEROBIC 12CCAERO,6CCANA   Culture NO GROWTH 2 DAYS  Final   Report Status PENDING  Incomplete  Culture, blood (routine x 2)     Status: None (Preliminary result)   Collection Time: 07/16/15  3:58 AM  Result Value Ref Range Status   Specimen Description BLOOD RIGHT FOREARM  Final   Special Requests   Final    BOTTLES DRAWN AEROBIC AND ANAEROBIC 12CCAERO,2CCANA   Culture NO GROWTH 2 DAYS  Final   Report Status PENDING  Incomplete    RADIOLOGY:  No results found.   Management plans discussed with the patient, family and they are in agreement.  CODE STATUS:     Code Status Orders        Start     Ordered   07/16/15 0626  Full code   Continuous     07/16/15 0625    Code Status History    Date Active Date Inactive Code Status Order ID Comments User Context   07/10/2015  1:49 AM 07/12/2015  1:01 PM Full Code [id number]  Oralia Manis, MD Inpatient   05/12/2015  3:46 PM 05/16/2015  5:35 PM Full Code [id number]  Auburn Bilberry, MD ED      TOTAL TIME TAKING CARE OF THIS PATIENT: 40 minutes.    Torren Maffeo M.D on 07/18/2015 at 10:42 AM  Between 7am to 6pm - Pager - [phone] After 6pm go to [email or web address] - password [email or web address] [facility]  [facility]  Office  [phone]  CC: Primary care physician; No primary care provider on file.

## 2015-07-18 NOTE — Progress Notes (Addendum)
Pt c/o not feeling well/ states she feels dizzy and jerky/ VSS/ fsbs 162/ no changes on tele/ no distress noted/ Dr. Allena Katz paged to make aware/ Dr. Allena Katz spoke with pt on the phone/ will order a PT consult/ will continue to monitor closely.

## 2015-07-18 NOTE — Progress Notes (Signed)
Pre-peritoneal dialysis 

## 2015-07-18 NOTE — Care Management (Signed)
Pt has recommend home health PT. Patient would like to use [facility].  Orders will need to be placed for RN and PT. I have given and heads up referral to [facility]. With patient's permission I have called her PCP office.  She has not had a follow up appointment since here PCP left the office.  Her current PCP is Kimberly Montgomery.  I have updated the system.  I have made her an follow up appointment with Kimberly Montgomery on April 24th at 330pm and have added to the AVS.

## 2015-07-18 NOTE — Progress Notes (Signed)
Patient ID: Kimberly Montgomery, female   DOB: [birth year], 50 y.o.   MRN: [id number] Pt is not up for going home. Received IVF Vitals ok Hold d/c

## 2015-07-18 NOTE — Progress Notes (Signed)
Pt continues to c/o dizziness post bolous- dr. Allena Katz made aware- will suspend discharge orders.

## 2015-07-18 NOTE — Clinical Documentation Improvement (Signed)
Internal Medicine  Please update your documentation within the medical record to reflect your response to this query. Thank you  Can the diagnosis of CHF be further specified?    Acuity - Acute, Chronic, Acute on Chronic   Type - Systolic, Diastolic, Systolic and Diastolic  Other  Clinically Undetermined  Document any associated diagnoses/conditions  Supporting Information: 07/17/15 prog note.Marland KitchenMarland Kitchen"Congestive heart failure continue home medication aspirin 81 mg, Plavix 75 mg, Coreg and amlodipine. Continue statin"...   Please exercise your independent, professional judgment when responding. A specific answer is not anticipated or expected.  Thank You,  Toribio Harbour, RN, BSN, CCDS Certified Clinical Documentation Specialist : Health Information Management [phone]

## 2015-07-18 NOTE — Progress Notes (Deleted)
Patient ID: Kimberly Montgomery, female   DOB: Dec 18, 1965, 50 y.o.   MRN: [id number] [facility] [place] [facility]   PATIENT NAME: Kimberly Montgomery    MR#:  [id number]  DATE OF BIRTH:  [birth year]  DATE OF ADMISSION:  07/15/2015 ADMITTING PHYSICIAN: Ihor Austin, MD  DATE OF DISCHARGE: 07/18/15  PRIMARY CARE PHYSICIAN: No primary care provider on file.    ADMISSION DIAGNOSIS:  SBP (spontaneous bacterial peritonitis) (HCC) [K65.2] Vomiting and diarrhea [R11.10, R19.7] ESRD on peritoneal dialysis (HCC) [N18.6, Z99.2] Type 2 diabetes mellitus with chronic kidney disease on chronic dialysis, unspecified long term insulin use status (HCC) [E11.22, N18.6]  DISCHARGE DIAGNOSIS:  SBP ESRD ON Peritoneal dialysis Dm-2 H/o chronic gastroparesis  SECONDARY DIAGNOSIS:   Past Medical History  Diagnosis Date  . Renal disorder   . Hypertension   . CHF (congestive heart failure) (HCC)   . Diabetes mellitus without complication (HCC)   . Gastroparesis   . Asthma   . ESRD (end stage renal disease) (HCC)   . Endometriosis   . CAD (coronary artery disease)   . HLD (hyperlipidemia)   . GERD (gastroesophageal reflux disease)     HOSPITAL COURSE:   50 year old female patient with history of end-stage renal disease on peritoneal dialysis, hypertension, coronary artery disease, congestive heart failure presented to the emergency room with nausea and vomiting and abdominal discomfort.  # Acute peritonitis with nausea vomiting and diffuse abdominal pain. Peritoneal fluid with neutrophils greater than 70% Continue IV antibiotics levofloxacin. No need for vanc per nephrology. -change to po levaquin -repeat PD fluid clearing. No fever. PD fluid cx neg Discussed with gastroenterology and nephrology. Provided symptomatic treatment with anti-emetics and pain medications as needed  #. End-stage renal disease on peritoneal dialysis Continue peritoneal dialysis as recommended  by nephrology. discussed with Dr. Wynelle Link Not considering to change peritoneal dialysis catheter at this time.  # Gastroparesis  PPI BID , erythromycin 250 qid Tolerating po diet  #. Mildly elevated troponin which could be secondary to renal failure Initial troponin 0.05. We will cycle cardiac biomarkers Patient denies any chest pain.  #. Hypertension Continue Coreg and Norvasc as tolerated  # Congestive heart failure, chronic, diastolic - continue home medication aspirin 81 mg, Plavix 75 mg, Coreg and amlodipine. - Continue statin   DVT prophylaxis subcutaneous heparin.  Overall improving Will d/c home after  lunch CONSULTS OBTAINED:  Treatment Team:  Mosetta Pigeon, MD Wallace Cullens, MD  DRUG ALLERGIES:   Allergies  Allergen Reactions  . Cephalosporins Anaphylaxis  . Penicillins Anaphylaxis and Other (See Comments)    Has patient had a PCN reaction causing immediate rash, facial/tongue/throat swelling, SOB or lightheadedness with hypotension: Yes Has patient had a PCN reaction causing severe rash involving mucus membranes or skin necrosis: No Has patient had a PCN reaction that required hospitalization No Has patient had a PCN reaction occurring within the last 10 years: No If all of the above answers are "NO", then may proceed with Cephalosporin use.  [name] Phenergan [Promethazine Hcl] Nausea And Vomiting    DISCHARGE MEDICATIONS:   Current Discharge Medication List    CONTINUE these medications which have CHANGED   Details  ondansetron (ZOFRAN ODT) 4 MG disintegrating tablet Take 1 tablet (4 mg total) by mouth every 6 (six) hours as needed for nausea or vomiting. Qty: 20 tablet, Refills: 0      CONTINUE these medications which have NOT CHANGED   Details  acetaminophen (TYLENOL) 500  MG tablet Take 1,000 mg by mouth every 6 (six) hours as needed for mild pain.     albuterol (PROVENTIL HFA;VENTOLIN HFA) 108 (90 Base) MCG/ACT inhaler Inhale 2 puffs into the lungs every  6 (six) hours as needed for wheezing or shortness of breath.    amLODipine (NORVASC) 10 MG tablet Take 10 mg by mouth daily.    aspirin EC 81 MG tablet Take 81 mg by mouth daily.    aspirin-acetaminophen-caffeine (EXCEDRIN MIGRAINE) 250-250-65 MG tablet Take 2 tablets by mouth every 6 (six) hours as needed for headache.    buPROPion (WELLBUTRIN SR) 150 MG 12 hr tablet Take 150 mg by mouth daily.    calcium acetate, Phos Binder, (PHOSLYRA) 667 MG/5ML SOLN Take 2,668 mg by mouth 3 (three) times daily with meals.     carvedilol (COREG) 6.25 MG tablet Take 6.25 mg by mouth 2 (two) times daily with a meal.    clopidogrel (PLAVIX) 75 MG tablet Take 75 mg by mouth daily.    docusate sodium (COLACE) 100 MG capsule Take 100 mg by mouth at bedtime as needed for mild constipation.    ezetimibe-simvastatin (VYTORIN) 10-40 MG tablet Take 1 tablet by mouth at bedtime.    gabapentin (NEURONTIN) 600 MG tablet Take 600 mg by mouth 3 (three) times daily. Refills: 5    insulin aspart (NOVOLOG) 100 UNIT/ML injection Inject 15 Units into the skin 3 (three) times daily with meals.    insulin glargine (LANTUS) 100 UNIT/ML injection Inject 65 Units into the skin at bedtime.    isosorbide mononitrate (IMDUR) 60 MG 24 hr tablet Take 1 tablet (60 mg total) by mouth daily. Qty: 30 tablet, Refills: 00    lisinopril (PRINIVIL,ZESTRIL) 40 MG tablet Take 40 mg by mouth daily. Refills: 3    nitroGLYCERIN (NITROSTAT) 0.4 MG SL tablet Place 0.4 mg under the tongue every 5 (five) minutes as needed for chest pain. Reported on 07/10/2015    omeprazole (PRILOSEC) 40 MG capsule Take 40 mg by mouth 2 (two) times daily.    polyethylene glycol (MIRALAX / GLYCOLAX) packet Take 17 g by mouth daily as needed for mild constipation.    ranolazine (RANEXA) 500 MG 12 hr tablet Take 1 tablet (500 mg total) by mouth 2 (two) times daily. Qty: 60 tablet, Refills: 0    torsemide (DEMADEX) 100 MG tablet Take 100 mg by mouth daily.         If you experience worsening of your admission symptoms, develop shortness of breath, life threatening emergency, suicidal or homicidal thoughts you must seek medical attention immediately by calling 911 or calling your MD immediately  if symptoms less severe.  You Must read complete instructions/literature along with all the possible adverse reactions/side effects for all the Medicines you take and that have been prescribed to you. Take any new Medicines after you have completely understood and accept all the possible adverse reactions/side effects.   Please note  You were cared for by a hospitalist during your hospital stay. If you have any questions about your discharge medications or the care you received while you were in the hospital after you are discharged, you can call the unit and asked to speak with the hospitalist on call if the hospitalist that took care of you is not available. Once you are discharged, your primary care physician will handle any further medical issues. Please note that NO REFILLS for any discharge medications will be authorized once you are discharged, as it is imperative  that you return to your primary care physician (or establish a relationship with a primary care physician if you do not have one) for your aftercare needs so that they can reassess your need for medications and monitor your lab values. Today   SUBJECTIVE   Mild nausea, no vomiting No fever VITAL SIGNS:  Blood pressure 130/57, pulse 70, temperature 98.2 F (36.8 C), temperature source Oral, resp. rate 18, height 5' 9.02" (1.753 m), weight 127.053 kg (280 lb 1.6 oz), last menstrual period 03/16/2015, SpO2 100 %.  I/O:   Intake/Output Summary (Last 24 hours) at 07/18/15 1030 Last data filed at 07/18/15 0947  Gross per 24 hour  Intake   1360 ml  Output   2212 ml  Net   -852 ml    PHYSICAL EXAMINATION:  GENERAL:  50 y.o.-year-old patient lying in the bed with no acute distress.   EYES: Pupils equal, round, reactive to light and accommodation. No scleral icterus. Extraocular muscles intact.  HEENT: Head atraumatic, normocephalic. Oropharynx and nasopharynx clear.  NECK:  Supple, no jugular venous distention. No thyroid enlargement, no tenderness.  LUNGS: Normal breath sounds bilaterally, no wheezing, rales,rhonchi or crepitation. No use of accessory muscles of respiration.  CARDIOVASCULAR: S1, S2 normal. No murmurs, rubs, or gallops.  ABDOMEN: Soft, non-tender, non-distended. Bowel sounds present. No organomegaly or mass. PD cath site looks ok EXTREMITIES: No pedal edema, cyanosis, or clubbing.  NEUROLOGIC: Cranial nerves II through XII are intact. Muscle strength 5/5 in all extremities. Sensation intact. Gait not checked.  PSYCHIATRIC: The patient is alert and oriented x 3.  SKIN: No obvious rash, lesion, or ulcer.   DATA REVIEW:   CBC   Recent Labs Lab 07/17/15 0450  WBC 8.8  HGB 9.4*  HCT 28.2*  PLT 289    Chemistries   Recent Labs Lab 07/15/15 1639  07/17/15 0450  NA 136  --  135  K 4.5  --  4.0  CL 100*  --  100*  CO2 20*  --  24  GLUCOSE 155*  --  145*  BUN 92*  --  77*  CREATININE 10.47*  < > 9.51*  CALCIUM 6.2*  --  6.4*  AST 16  --   --   ALT 13*  --   --   ALKPHOS 102  --   --   BILITOT 0.6  --   --   < > = values in this interval not displayed.  Microbiology Results   Recent Results (from the past 240 hour(s))  Body fluid culture     Status: None (Preliminary result)   Collection Time: 07/15/15 10:23 PM  Result Value Ref Range Status   Specimen Description PERITONEAL  Final   Special Requests Normal  Final   Gram Stain RARE WBC SEEN NO ORGANISMS SEEN   Final   Culture NO GROWTH 3 DAYS  Final   Report Status PENDING  Incomplete  Culture, blood (routine x 2)     Status: None (Preliminary result)   Collection Time: 07/16/15  3:58 AM  Result Value Ref Range Status   Specimen Description BLOOD RIGHT WRIST  Final   Special  Requests   Final    BOTTLES DRAWN AEROBIC AND ANAEROBIC 12CCAERO,6CCANA   Culture NO GROWTH 2 DAYS  Final   Report Status PENDING  Incomplete  Culture, blood (routine x 2)     Status: None (Preliminary result)   Collection Time: 07/16/15  3:58 AM  Result Value Ref Range Status  Specimen Description BLOOD RIGHT FOREARM  Final   Special Requests   Final    BOTTLES DRAWN AEROBIC AND ANAEROBIC 12CCAERO,2CCANA   Culture NO GROWTH 2 DAYS  Final   Report Status PENDING  Incomplete    RADIOLOGY:  No results found.   Management plans discussed with the patient, family and they are in agreement.  CODE STATUS:     Code Status Orders        Start     Ordered   07/16/15 0626  Full code   Continuous     07/16/15 0625    Code Status History    Date Active Date Inactive Code Status Order ID Comments User Context   07/10/2015  1:49 AM 07/12/2015  1:01 PM Full Code [id number]  Oralia Manis, MD Inpatient   05/12/2015  3:46 PM 05/16/2015  5:35 PM Full Code [id number]  Auburn Bilberry, MD ED      TOTAL TIME TAKING CARE OF THIS PATIENT: 40 minutes.    Shawndale Kilpatrick M.D on 07/18/2015 at 10:30 AM  Between 7am to 6pm - Pager - [phone] After 6pm go to [email or web address] - password [email or web address] [facility]  [place] [facility]  Office  [phone]  CC: Primary care physician; No primary care provider on file.

## 2015-07-18 NOTE — Progress Notes (Signed)
Subjective:  Seen and examined on hemodialysis. Tolerating treatment well. UF of .   Objective:  Vital signs in last 24 hours:  Temp:  [97.8 F (36.6 C)-98.6 F (37 C)] 98.2 F (36.8 C) (04/18 0730) Pulse Rate:  [70-78] 70 (04/18 0730) Resp:  [18-20] 18 (04/18 0730) BP: (123-151)/(57-63) 130/57 mmHg (04/18 0730) SpO2:  [97 %-100 %] 100 % (04/18 0730) Weight:  [127.053 kg (280 lb 1.6 oz)] 127.053 kg (280 lb 1.6 oz) (04/18 0448)  Weight change: 8.437 kg (18 lb 9.6 oz) Filed Weights   07/16/15 0751 07/17/15 0443 07/18/15 0448  Weight: 118.616 kg (261 lb 8 oz) 123.106 kg (271 lb 6.4 oz) 127.053 kg (280 lb 1.6 oz)    Intake/Output:    Intake/Output Summary (Last 24 hours) at 07/18/15 0936 Last data filed at 07/18/15 0754  Gross per 24 hour  Intake   1760 ml  Output   1200 ml  Net    560 ml     Physical Exam: General: NAD  HEENT Anicteric, moist oral mucous membranes  Neck supple  Pulm/lungs Normal effort, clear to auscultation bilaterally  CVS/Heart No rub or gallop  Abdomen:  Soft, diffuse abdominal tenderness,  Extremities: trace  peripheral edema, right BKA  Neurologic: Alert, oriented, speech normal  Skin: No acute rashes  Access: PD catheter,exit site nontender       Basic Metabolic Panel:   Recent Labs Lab 07/11/15 1702 07/15/15 1639 07/16/15 0803 07/17/15 0450  NA  --  136  --  135  K  --  4.5  --  4.0  CL  --  100*  --  100*  CO2  --  20*  --  24  GLUCOSE  --  155*  --  145*  BUN  --  92*  --  77*  CREATININE  --  10.47* 9.44* 9.51*  CALCIUM  --  6.2*  --  6.4*  PHOS 11.3*  --   --   --      CBC:  Recent Labs Lab 07/12/15 0429 07/15/15 1639 07/16/15 0803 07/17/15 0450  WBC 14.3* 9.7 10.8 8.8  HGB 9.3* 10.7* 9.4* 9.4*  HCT 28.4* 31.8* 29.0* 28.2*  MCV 84.6 83.8 83.8 84.0  PLT 342 352 322 289      Microbiology:  Recent Results (from the past 720 hour(s))  Body fluid culture     Status: None (Preliminary result)   Collection Time: 07/15/15 10:23 PM  Result Value Ref Range Status   Specimen Description PERITONEAL  Final   Special Requests Normal  Final   Gram Stain RARE WBC SEEN NO ORGANISMS SEEN   Final   Culture NO GROWTH 2 DAYS  Final   Report Status PENDING  Incomplete  Culture, blood (routine x 2)     Status: None (Preliminary result)   Collection Time: 07/16/15  3:58 AM  Result Value Ref Range Status   Specimen Description BLOOD RIGHT WRIST  Final   Special Requests   Final    BOTTLES DRAWN AEROBIC AND ANAEROBIC 12CCAERO,6CCANA   Culture NO GROWTH 2 DAYS  Final   Report Status PENDING  Incomplete  Culture, blood (routine x 2)     Status: None (Preliminary result)   Collection Time: 07/16/15  3:58 AM  Result Value Ref Range Status   Specimen Description BLOOD RIGHT FOREARM  Final   Special Requests   Final    BOTTLES DRAWN AEROBIC AND ANAEROBIC 12CCAERO,2CCANA   Culture NO GROWTH 2  DAYS  Final   Report Status PENDING  Incomplete    Coagulation Studies: No results for input(s): LABPROT, INR in the last 72 hours.  Urinalysis:  Recent Labs  07/15/15 2223  COLORURINE YELLOW*  LABSPEC 1.015  PHURINE 6.0  GLUCOSEU >500*  HGBUR 1+*  BILIRUBINUR NEGATIVE  KETONESUR TRACE*  PROTEINUR >500*  NITRITE NEGATIVE  LEUKOCYTESUR NEGATIVE      Imaging: No results found.   Medications:     . amLODipine  10 mg Oral Daily  . aspirin EC  81 mg Oral Daily  . buPROPion  150 mg Oral Daily  . carvedilol  6.25 mg Oral BID WC  . clopidogrel  75 mg Oral Daily  . dialysis solution 2.5% low-MG/low-CA   Intraperitoneal Q24H  . dialysis solution 2.5% low-MG/low-CA   Intraperitoneal Q24H  . erythromycin  250 mg Oral 4 times per day  . ezetimibe  10 mg Oral QHS  . gabapentin  600 mg Oral TID  . gentamicin cream  1 application Topical Daily  . heparin  5,000 Units Subcutaneous 3 times per day  . insulin aspart  0-5 Units Subcutaneous QHS  . insulin aspart  0-9 Units Subcutaneous TID WC  .  isosorbide mononitrate  60 mg Oral Daily  . levofloxacin (LEVAQUIN) IV  500 mg Intravenous Q48H  . lisinopril  40 mg Oral Daily  . pantoprazole  40 mg Oral BID  . ranolazine  500 mg Oral BID  . sodium chloride flush  3 mL Intravenous Q12H   sodium chloride, acetaminophen **OR** acetaminophen, albuterol, docusate sodium, heparin, morphine injection, ondansetron **OR** ondansetron (ZOFRAN) IV, polyethylene glycol, prochlorperazine, sodium chloride flush  Assessment/ Plan:  50 y.o. white  female with past medical history of end-stage renal disease on peritoneal dialysis, asthma, gastroparesis, diabetes mellitus, congestive heart failure, hypertension, endometriosis, coronary atherosclerosis, medical management  [facility]/[address]/peritoneal dialysis  1. End-stage renal disease on peritoneal dialysis. Home PD prescription of 3 exchanges 8 hours with 2.2 litre fills. 2.5% dextrose  2. Acute peritonitis in PD patient. WBC cell count 690 with 73% neutrophils - PD fluid culture is no growth - Empiric treatment with broad-spectrum antibiotics vancomycin and Levaquin. Vanco last given on 4/16.   3. Anemia chronic kidney disease.hemoglobin 9.4 - epo as outpatient.   4. Secondary hyperparathyroidism. phos elevated at 11.3 on 4/11. Not currently on a binder. Takes calcium acetate as outpatient.      LOS: 2 Kimberly Montgomery 4/18/20179:36 AM

## 2015-07-18 NOTE — Evaluation (Signed)
Physical Therapy Evaluation Patient Details Name: Kimberly Montgomery MRN: [id number] DOB: 18-Jan-1966 Today's Date: 07/18/2015   History of Present Illness  Pt is a 50 y.o. F admitted to hospital for peritonitis. Pt was perviously admitted to hospital for cardiac cath and discharged on 4/12. Pt stated she has been vomiting since previous discharge. Pt currently on peritoneal dialysis. Pt has hx of end stage renal disease, CHF, HTN, and GERD. 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   Clinical Impression  Pt is a pleasant 50 y.o. F admitted to hospital for peritonitis. Prior to admission, pt lived alone and was independent with all ADLs. Pt used cane for ambulation d/t R BKA. Pt awake and alert during the evaluation. Pt demonstrated good B UE and LE strength. LE strength assessment limited by pts pain. Pt able to perform bed mobility with modified independence. Pt able to transfer from sit to stand on EOB using RW and min assist. Pt able to ambulate in room using RW and CGA. Ambulation limited by pts dizziness. Recommended to pt that she use RW in short term for safety when ambulating. Pt demonstrates deficits in mobility and use of RW for ambulation. Pt would benefit from further skilled PT to address deficits and return to PLOF; recommend pt receive home health PT upon discharge from acute hospitalization.     Follow Up Recommendations Home health PT    Equipment Recommendations  Rolling walker with 5" wheels    Recommendations for Other Services       Precautions / Restrictions Precautions Precautions: Fall Restrictions Weight Bearing Restrictions: No      Mobility  Bed Mobility Overal bed mobility: Modified Independent             General bed mobility comments: Pt sat up to EOB upon PTs arrival prior to instruction. Pt required use of bed railings and increased time to sit up.   Transfers Overall transfer level: Needs assistance Equipment used: Rolling walker (2  wheeled) Transfers: Sit to/from Stand Sit to Stand: Min assist         General transfer comment: Pt required use of walker and min assist to stand from EOB. Pt instructed on proper hand placement on walker for transfer. Pt required 2 attempts to stand initially.   Ambulation/Gait Ambulation/Gait assistance: Min guard Ambulation Distance (Feet): 7 Feet Assistive device: Rolling walker (2 wheeled) Gait Pattern/deviations: Step-through pattern Gait velocity: slow   General Gait Details: Pt able to ambulate in room approx 7 ft using RW and CGA. Pt provided cues for proper hand and foot placement with walker. Ambulation limited by pts dizziness. Pt became dizzy after taking a few steps, sat down on EOB. Pt was able to transfer from EOB to recliner after rest w/o dizziness.   Stairs            Wheelchair Mobility    Modified Rankin (Stroke Patients Only)       Balance Overall balance assessment: Modified Independent (use of RW and UE on bed)                                           Pertinent Vitals/Pain Pain Assessment: 0-10 Pain Score: 7  Pain Location: stomach, back, legs Pain Intervention(s): Limited activity within patient's tolerance    Home Living Family/patient expects to be discharged to:: Private residence Living Arrangements: Alone Available  Help at Discharge: Family (daughter) Type of Home: House Home Access: Stairs to enter Entrance Stairs-Rails: Right Entrance Stairs-Number of Steps: 3 Home Layout: One level Home Equipment: Walker - 4 wheels;Cane - single point (pt stated walker is broken ) Additional Comments: Pt able to perform all ADLs prior to admission. Pt uses cane for ambulation. Stated she owns a 4 wheel walker but one of the wheels does not work.     Prior Function Level of Independence: Independent with assistive device(s)               Hand Dominance        Extremity/Trunk Assessment   Upper Extremity  Assessment: RUE deficits/detail;LUE deficits/detail RUE Deficits / Details: R UE grossly 4/5 strength     LUE Deficits / Details: L UE grossly 4/5 strength   Lower Extremity Assessment: RLE deficits/detail;LLE deficits/detail RLE Deficits / Details: R LE quad strength grossly 4/5  LLE Deficits / Details: L LE grossly 4/5, assessment limited by pts pain      Communication   Communication: No difficulties  Cognition Arousal/Alertness: Awake/alert Behavior During Therapy: WFL for tasks assessed/performed Overall Cognitive Status: Within Functional Limits for tasks assessed                      General Comments      Exercises        Assessment/Plan    PT Assessment Patient needs continued PT services  PT Diagnosis Difficulty walking;Abnormality of gait;Acute pain   PT Problem List Decreased strength;Decreased mobility;Decreased knowledge of use of DME  PT Treatment Interventions DME instruction;Gait training;Stair training;Therapeutic exercise   PT Goals (Current goals can be found in the Care Plan section) Acute Rehab PT Goals Patient Stated Goal: to return to PLOF PT Goal Formulation: With patient Potential to Achieve Goals: Good    Frequency Min 2X/week   Barriers to discharge Decreased caregiver support Pt has occasional help from daughter     Co-evaluation               End of Session Equipment Utilized During Treatment: Gait belt Activity Tolerance: Patient limited by fatigue Patient left: in chair;with call bell/phone within reach Nurse Communication: Mobility status         Time: 5366-4403 PT Time Calculation (min) (ACUTE ONLY): 14 min   Charges:         PT G Codes:        Dorita Fray 07/23/2015, 2:29 PM M. Hettie Holstein, SPT

## 2015-07-18 NOTE — Progress Notes (Signed)
Peritoneal dialysis start 

## 2015-07-18 NOTE — Progress Notes (Signed)
Patient ID: Kimberly Montgomery, female   DOB: 1965-05-21, 50 y.o.   MRN: [id number] Called by Rn that pt has dizziness. PT worlked with her and recommends walker. Orthostatic vitals negative for orthostasis. Will igive 250 ml NS bolus. Pt is otherwise stable.

## 2015-07-18 NOTE — Care Management Important Message (Signed)
Important Message  Patient Details  Name: Kimberly Montgomery MRN: [id number] Date of Birth: 07/13/1965   Medicare Important Message Given:  Yes    [name] A Shavette Shoaff 07/18/2015, 10:30 AM

## 2015-07-19 ENCOUNTER — Inpatient Hospital Stay: Payer: Medicare HMO

## 2015-07-19 DIAGNOSIS — G253 Myoclonus: Secondary | ICD-10-CM

## 2015-07-19 LAB — GLUCOSE, CAPILLARY
GLUCOSE-CAPILLARY: 140 mg/dL — AB (ref 65–99)
GLUCOSE-CAPILLARY: 146 mg/dL — AB (ref 65–99)
Glucose-Capillary: 143 mg/dL — ABNORMAL HIGH (ref 65–99)
Glucose-Capillary: 99 mg/dL (ref 65–99)

## 2015-07-19 LAB — BODY FLUID CULTURE
CULTURE: NO GROWTH
Special Requests: NORMAL

## 2015-07-19 LAB — VANCOMYCIN, RANDOM: Vancomycin Rm: 19 ug/mL

## 2015-07-19 LAB — VANCOMYCIN, TROUGH: Vancomycin Tr: 22 ug/mL (ref 10–20)

## 2015-07-19 MED ORDER — OXYCODONE-ACETAMINOPHEN 5-325 MG PO TABS
1.0000 | ORAL_TABLET | Freq: Four times a day (QID) | ORAL | Status: DC | PRN
  Administered 2015-07-19 – 2015-07-20 (×4): 1 via ORAL
  Filled 2015-07-19 (×4): qty 1

## 2015-07-19 MED ORDER — LORAZEPAM 2 MG/ML IJ SOLN
1.0000 mg | Freq: Once | INTRAMUSCULAR | Status: AC
  Administered 2015-07-19: 1 mg via INTRAVENOUS

## 2015-07-19 MED ORDER — LORAZEPAM 2 MG/ML IJ SOLN
INTRAMUSCULAR | Status: AC
  Administered 2015-07-19: 1 mg via INTRAVENOUS
  Filled 2015-07-19: qty 1

## 2015-07-19 MED ORDER — SODIUM CHLORIDE 0.9 % IJ SOLN
INTRAMUSCULAR | Status: AC
  Administered 2015-07-19: 09:00:00
  Filled 2015-07-19: qty 10

## 2015-07-19 NOTE — Progress Notes (Signed)
Pharmacy Antibiotic Note  Kimberly Montgomery is a 50 y.o. female admitted on 07/15/2015 with IAI.  Pharmacy has been consulted for levofloxacin dosing for patient on peritoneal dialysis.   Plan: Continue levofloxacin 500mg  q 48 hours  Height: 5' 9.02" (175.3 cm) Weight: 280 lb 1.6 oz (127.053 kg) IBW/kg (Calculated) : 66.24  Temp (24hrs), Avg:97.9 F (36.6 C), Min:97.6 F (36.4 C), Max:98.5 F (36.9 C)   Recent Labs Lab 07/15/15 1639 07/16/15 0803 07/17/15 0450 07/19/15 0431 07/19/15 1233  WBC 9.7 10.8 8.8  --   --   CREATININE 10.47* 9.44* 9.51*  --   --   VANCOTROUGH  --   --   --  22*  --   VANCORANDOM  --   --   --   --  19    Estimated Creatinine Clearance: 10 mL/min (by C-G formula based on Cr of 9.51).    Allergies  Allergen Reactions  . Cephalosporins Anaphylaxis  . Penicillins Anaphylaxis and Other (See Comments)    Has patient had a PCN reaction causing immediate rash, facial/tongue/throat swelling, SOB or lightheadedness with hypotension: Yes Has patient had a PCN reaction causing severe rash involving mucus membranes or skin necrosis: No Has patient had a PCN reaction that required hospitalization No Has patient had a PCN reaction occurring within the last 10 years: No If all of the above answers are "NO", then may proceed with Cephalosporin use.  [name] Phenergan [Promethazine Hcl] Nausea And Vomiting    Thank you for allowing pharmacy to be a part of this patient's care.  Olene Floss, Pharm.D. Clinical Pharmacist 07/19/2015 2:28 PM

## 2015-07-19 NOTE — Progress Notes (Signed)
Patient ID: [name], female   DOB: January 18, 1966, 50 y.o.   MRN: [id number] [facility] - [place] at [facility]   PATIENT NAME: Kimberly Montgomery    MR#:  [id number]  DATE OF BIRTH:  06/01/1965  SUBJECTIVE:   Weakness, jerky spells intermittent upper body REVIEW OF SYSTEMS:   Review of Systems  Constitutional: Negative for fever, chills and weight loss.  HENT: Negative for ear discharge, ear pain and nosebleeds.   Eyes: Negative for blurred vision, pain and discharge.  Respiratory: Negative for sputum production, shortness of breath, wheezing and stridor.   Cardiovascular: Negative for chest pain, palpitations, orthopnea and PND.  Gastrointestinal: Negative for nausea, vomiting, abdominal pain and diarrhea.  Genitourinary: Negative for urgency and frequency.  Musculoskeletal: Negative for back pain and joint pain.  Neurological: Positive for dizziness, tremors and weakness. Negative for sensory change, speech change and focal weakness.  Psychiatric/Behavioral: Negative for depression and hallucinations. The patient is not nervous/anxious.   All other systems reviewed and are negative.  Tolerating Diet:yes Tolerating PT: rec HHPT for now  DRUG ALLERGIES:   Allergies  Allergen Reactions  . Cephalosporins Anaphylaxis  . Penicillins Anaphylaxis and Other (See Comments)    Has patient had a PCN reaction causing immediate rash, facial/tongue/throat swelling, SOB or lightheadedness with hypotension: Yes Has patient had a PCN reaction causing severe rash involving mucus membranes or skin necrosis: No Has patient had a PCN reaction that required hospitalization No Has patient had a PCN reaction occurring within the last 10 years: No If all of the above answers are "NO", then may proceed with Cephalosporin use.  [name] Phenergan [Promethazine Hcl] Nausea And Vomiting    VITALS:  Blood pressure 105/82, pulse 79, temperature 98.5 F (36.9 C), temperature source Oral,  resp. rate 20, height 5' 9.02" (1.753 m), weight 127.053 kg (280 lb 1.6 oz), last menstrual period 03/16/2015, SpO2 99 %.  PHYSICAL EXAMINATION:   Physical Exam  GENERAL:  50 y.o.-year-old patient lying in the bed with no acute distress.  EYES: Pupils equal, round, reactive to light and accommodation. No scleral icterus. Extraocular muscles intact.  HEENT: Head atraumatic, normocephalic. Oropharynx and nasopharynx clear.  NECK:  Supple, no jugular venous distention. No thyroid enlargement, no tenderness.  LUNGS: Normal breath sounds bilaterally, no wheezing, rales, rhonchi. No use of accessory muscles of respiration.  CARDIOVASCULAR: S1, S2 normal. No murmurs, rubs, or gallops.  ABDOMEN: Soft, nontender, nondistended. Bowel sounds present. No organomegaly or mass.  EXTREMITIES: No cyanosis, clubbing or edema b/l.   Right prosthetic leg NEUROLOGIC: Cranial nerves II through XII are intact. No focal Motor or sensory deficits b/l.   PSYCHIATRIC:  patient is alert and oriented x 3.  SKIN: No obvious rash, lesion, or ulcer.   LABORATORY PANEL:  CBC  Recent Labs Lab 07/17/15 0450  WBC 8.8  HGB 9.4*  HCT 28.2*  PLT 289    Chemistries   Recent Labs Lab 07/15/15 1639  07/17/15 0450  NA 136  --  135  K 4.5  --  4.0  CL 100*  --  100*  CO2 20*  --  24  GLUCOSE 155*  --  145*  BUN 92*  --  77*  CREATININE 10.47*  < > 9.51*  CALCIUM 6.2*  --  6.4*  AST 16  --   --   ALT 13*  --   --   ALKPHOS 102  --   --   BILITOT 0.6  --   --   < > =  values in this interval not displayed. Cardiac Enzymes  Recent Labs Lab 07/15/15 2111  TROPONINI 0.05*   RADIOLOGY:  No results found. ASSESSMENT AND PLAN:   50 year old female patient with history of end-stage renal disease on peritoneal dialysis, hypertension, coronary artery disease, congestive heart failure presented to the emergency room with nausea and vomiting and abdominal discomfort.  # Acute peritonitis with nausea vomiting  and diffuse abdominal pain. Peritoneal fluid with neutrophils greater than 70% Continue IV antibiotics levofloxacin. No need for vanc per nephrology. -change to po levaquin -repeat PD fluid clearing. No fever. PD fluid cx neg Discussed with gastroenterology and nephrology. Provided symptomatic treatment with anti-emetics and pain medications as needed  #. End-stage renal disease on peritoneal dialysis Continue peritoneal dialysis as recommended by nephrology. discussed with Dr. Wynelle Link Not considering to change peritoneal dialysis catheter at this time.  # Gastroparesis  PPI BID , erythromycin 250 qid Tolerating po diet  #. Mildly elevated troponin which could be secondary to renal failure Initial troponin 0.05. We will cycle cardiac biomarkers Patient denies any chest pain.  #. Hypertension Continue Coreg and Norvasc as tolerated  # Congestive heart failure, chronic, diastolic - continue home medication aspirin 81 mg, Plavix 75 mg, Coreg and amlodipine. - Continue statin   DVT prophylaxis subcutaneous heparin.  # mild jerks ?unlcear etio -gave ativan 1 mg Spoke with dr Thad Ranger to see pt  Case discussed with Care Management/Social Worker. Management plans discussed with the patient, family and they are in agreement.  CODE STATUS: full  DVT Prophylaxis: heparin  TOTAL TIME TAKING CARE OF THIS PATIENT: 35 minutes.  >50% time spent on counselling and coordination of care   POSSIBLE D/C IN 1-2 DAYS, DEPENDING ON CLINICAL CONDITION.  Note: This dictation was prepared with Dragon dictation along with smaller phrase technology. Any transcriptional errors that result from this process are unintentional.  Glendon Fiser M.D on 07/19/2015 at 8:43 AM  Between 7am to 6pm - Pager - [phone]  After 6pm go to [email or web address] - password [email or web address] [facility]  [place] [facility]  Office  [phone]  CC: Primary care physician; Jarome Matin

## 2015-07-19 NOTE — Progress Notes (Signed)
Subjective:   Peritoneal dialysis last night. Tolerated treatment well. UF of .   Now with jerking motions - neurology consulted  Objective:  Vital signs in last 24 hours:  Temp:  [97.6 F (36.4 C)-98.5 F (36.9 C)] 97.6 F (36.4 C) (04/19 1015) Pulse Rate:  [67-79] 73 (04/19 1015) Resp:  [18-20] 18 (04/19 1015) BP: (102-124)/(45-82) 124/59 mmHg (04/19 1015) SpO2:  [96 %-99 %] 97 % (04/19 1015) Weight:  [127.053 kg (280 lb 1.6 oz)] 127.053 kg (280 lb 1.6 oz) (04/19 0838)  Weight change:  Filed Weights   07/17/15 0443 07/18/15 0448 07/19/15 0838  Weight: 123.106 kg (271 lb 6.4 oz) 127.053 kg (280 lb 1.6 oz) 127.053 kg (280 lb 1.6 oz)    Intake/Output:    Intake/Output Summary (Last 24 hours) at 07/19/15 1109 Last data filed at 07/19/15 0959  Gross per 24 hour  Intake    720 ml  Output   1386 ml  Net   -666 ml     Physical Exam: General: NAD  HEENT Anicteric, moist oral mucous membranes  Neck supple  Pulm/lungs Normal effort, clear to auscultation bilaterally  CVS/Heart No rub or gallop  Abdomen:  Soft, nontender  Extremities: No peripheral edema, right BKA  Neurologic: Alert, oriented, speech normal  Skin: No acute rashes  Access: PD catheter, exit site nontender and clean       Basic Metabolic Panel:   Recent Labs Lab 07/15/15 1639 07/16/15 0803 07/17/15 0450  NA 136  --  135  K 4.5  --  4.0  CL 100*  --  100*  CO2 20*  --  24  GLUCOSE 155*  --  145*  BUN 92*  --  77*  CREATININE 10.47* 9.44* 9.51*  CALCIUM 6.2*  --  6.4*     CBC:  Recent Labs Lab 07/15/15 1639 07/16/15 0803 07/17/15 0450  WBC 9.7 10.8 8.8  HGB 10.7* 9.4* 9.4*  HCT 31.8* 29.0* 28.2*  MCV 83.8 83.8 84.0  PLT 352 322 289      Microbiology:  Recent Results (from the past 720 hour(s))  Body fluid culture     Status: None   Collection Time: 07/15/15 10:23 PM  Result Value Ref Range Status   Specimen Description PERITONEAL  Final   Special Requests Normal   Final   Gram Stain RARE WBC SEEN NO ORGANISMS SEEN   Final   Culture No growth aerobically or anaerobically.  Final   Report Status 07/19/2015 FINAL  Final  Culture, blood (routine x 2)     Status: None (Preliminary result)   Collection Time: 07/16/15  3:58 AM  Result Value Ref Range Status   Specimen Description BLOOD RIGHT WRIST  Final   Special Requests   Final    BOTTLES DRAWN AEROBIC AND ANAEROBIC 12CCAERO,6CCANA   Culture NO GROWTH 3 DAYS  Final   Report Status PENDING  Incomplete  Culture, blood (routine x 2)     Status: None (Preliminary result)   Collection Time: 07/16/15  3:58 AM  Result Value Ref Range Status   Specimen Description BLOOD RIGHT FOREARM  Final   Special Requests   Final    BOTTLES DRAWN AEROBIC AND ANAEROBIC 12CCAERO,2CCANA   Culture NO GROWTH 3 DAYS  Final   Report Status PENDING  Incomplete    Coagulation Studies: No results for input(s): LABPROT, INR in the last 72 hours.  Urinalysis: No results for input(s): COLORURINE, LABSPEC, PHURINE, GLUCOSEU, HGBUR, BILIRUBINUR, KETONESUR, PROTEINUR, UROBILINOGEN,  NITRITE, LEUKOCYTESUR in the last 72 hours.  Invalid input(s): APPERANCEUR    Imaging: No results found.   Medications:     . amLODipine  10 mg Oral Daily  . aspirin EC  81 mg Oral Daily  . buPROPion  150 mg Oral Daily  . carvedilol  6.25 mg Oral BID WC  . clopidogrel  75 mg Oral Daily  . dialysis solution 2.5% low-MG/low-CA   Intraperitoneal Q24H  . dialysis solution 2.5% low-MG/low-CA   Intraperitoneal Q24H  . erythromycin  250 mg Oral 4 times per day  . ezetimibe  10 mg Oral QHS  . gabapentin  600 mg Oral TID  . gentamicin cream  1 application Topical Daily  . heparin  5,000 Units Subcutaneous 3 times per day  . insulin aspart  0-5 Units Subcutaneous QHS  . insulin aspart  0-9 Units Subcutaneous TID WC  . isosorbide mononitrate  60 mg Oral Daily  . levofloxacin  500 mg Oral Q48H  . lisinopril  40 mg Oral Daily  . pantoprazole  40  mg Oral BID  . ranolazine  500 mg Oral BID  . sodium chloride flush  3 mL Intravenous Q12H   sodium chloride, acetaminophen **OR** acetaminophen, albuterol, docusate sodium, heparin, morphine injection, ondansetron **OR** ondansetron (ZOFRAN) IV, polyethylene glycol, prochlorperazine, sodium chloride flush  Assessment/ Plan:  50 y.o. white  female with past medical history of end-stage renal disease on peritoneal dialysis, asthma, gastroparesis, diabetes mellitus, congestive heart failure, hypertension, endometriosis, coronary atherosclerosis, medical management  [facility]/[address]/peritoneal dialysis  1. End-stage renal disease on peritoneal dialysis. Home PD prescription of 3 exchanges 8 hours with 2.2 litre fills. 2.5% dextrose - nightly.   2. Acute peritonitis in PD patient. WBC cell count 690 with 73% neutrophils - PD fluid culture is no growth - Empiric treatment with broad-spectrum antibiotics vancomycin and Levaquin. Vanco last given on 4/16.   3. Anemia chronic kidney disease.hemoglobin 9.4 - epo as outpatient.   4. Secondary hyperparathyroidism. phos elevated at 11.3 on 4/11. Not currently on a binder. Takes calcium acetate as outpatient.      LOS: 3 Kimberly Montgomery 4/19/201711:09 AM

## 2015-07-19 NOTE — Progress Notes (Addendum)
Patient ID: HELAINA STEFANO, female   DOB: 08-Oct-1965, 50 y.o.   MRN: [id number] [facility] - [place] at [facility]   PATIENT NAME: Kimberly Montgomery    MR#:  [id number]  DATE OF BIRTH:  [birth year]  DATE OF ADMISSION:  07/15/2015 ADMITTING PHYSICIAN: Ihor Austin, MD  DATE OF DISCHARGE: 07/18/15  PRIMARY CARE PHYSICIAN: Jarome Matin    ADMISSION DIAGNOSIS:  SBP (spontaneous bacterial peritonitis) (HCC) [K65.2] Vomiting and diarrhea [R11.10, R19.7] ESRD on peritoneal dialysis (HCC) [N18.6, Z99.2] Type 2 diabetes mellitus with chronic kidney disease on chronic dialysis, unspecified long term insulin use status (HCC) [E11.22, N18.6]  DISCHARGE DIAGNOSIS:  SBP ESRD ON Peritoneal dialysis Dm-2 H/o chronic gastroparesis  SECONDARY DIAGNOSIS:   Past Medical History  Diagnosis Date  . Renal disorder   . Hypertension   . CHF (congestive heart failure) (HCC)   . Diabetes mellitus without complication (HCC)   . Gastroparesis   . Asthma   . ESRD (end stage renal disease) (HCC)   . Endometriosis   . CAD (coronary artery disease)   . HLD (hyperlipidemia)   . GERD (gastroesophageal reflux disease)     HOSPITAL COURSE:   50 year old female patient with history of end-stage renal disease on peritoneal dialysis, hypertension, coronary artery disease, congestive heart failure presented to the emergency room with nausea and vomiting and abdominal discomfort.  # Acute peritonitis with nausea vomiting and diffuse abdominal pain. Peritoneal fluid with neutrophils greater than 70% Continue IV antibiotics levofloxacin. No need for vanc per nephrology. -change to po levaquin -repeat PD fluid clearing. No fever. PD fluid cx neg Discussed with gastroenterology and nephrology. Provided symptomatic treatment with anti-emetics and pain medications as needed  #. End-stage renal disease on peritoneal dialysis Continue peritoneal dialysis as recommended by nephrology.  discussed with Dr. Wynelle Link Not considering to change peritoneal dialysis catheter at this time.  # Gastroparesis  PPI BID , erythromycin 250 qid Tolerating po diet  #. Mildly elevated troponin which could be secondary to renal failure Initial troponin 0.05. We will cycle cardiac biomarkers Patient denies any chest pain.  #. Hypertension Continue Coreg and Norvasc as tolerated  # Congestive heart failure, chronic, diastolic - continue home medication aspirin 81 mg, Plavix 75 mg, Coreg and amlodipine. - Continue statin   DVT prophylaxis subcutaneous heparin.  Overall improving Will d/c home after  lunch CONSULTS OBTAINED:  Treatment Team:  Mosetta Pigeon, MD  DRUG ALLERGIES:   Allergies  Allergen Reactions  . Cephalosporins Anaphylaxis  . Penicillins Anaphylaxis and Other (See Comments)    Has patient had a PCN reaction causing immediate rash, facial/tongue/throat swelling, SOB or lightheadedness with hypotension: Yes Has patient had a PCN reaction causing severe rash involving mucus membranes or skin necrosis: No Has patient had a PCN reaction that required hospitalization No Has patient had a PCN reaction occurring within the last 10 years: No If all of the above answers are "NO", then may proceed with Cephalosporin use.  [name] Phenergan [Promethazine Hcl] Nausea And Vomiting    DISCHARGE MEDICATIONS:   Current Discharge Medication List    START taking these medications   Details  erythromycin (ERY-TAB) 250 MG EC tablet Take 1 tablet (250 mg total) by mouth 4 (four) times daily. Qty: 120 tablet, Refills: 1    levofloxacin (LEVAQUIN) 500 MG tablet Take 1 tablet (500 mg total) by mouth every other day. Qty: 5 tablet, Refills: 0      CONTINUE these medications which have CHANGED  Details  ondansetron (ZOFRAN ODT) 4 MG disintegrating tablet Take 1 tablet (4 mg total) by mouth every 6 (six) hours as needed for nausea or vomiting. Qty: 20 tablet, Refills: 0       CONTINUE these medications which have NOT CHANGED   Details  acetaminophen (TYLENOL) 500 MG tablet Take 1,000 mg by mouth every 6 (six) hours as needed for mild pain.     albuterol (PROVENTIL HFA;VENTOLIN HFA) 108 (90 Base) MCG/ACT inhaler Inhale 2 puffs into the lungs every 6 (six) hours as needed for wheezing or shortness of breath.    amLODipine (NORVASC) 10 MG tablet Take 10 mg by mouth daily.    aspirin EC 81 MG tablet Take 81 mg by mouth daily.    aspirin-acetaminophen-caffeine (EXCEDRIN MIGRAINE) 250-250-65 MG tablet Take 2 tablets by mouth every 6 (six) hours as needed for headache.    buPROPion (WELLBUTRIN SR) 150 MG 12 hr tablet Take 150 mg by mouth daily.    calcium acetate, Phos Binder, (PHOSLYRA) 667 MG/5ML SOLN Take 2,668 mg by mouth 3 (three) times daily with meals.     carvedilol (COREG) 6.25 MG tablet Take 6.25 mg by mouth 2 (two) times daily with a meal.    clopidogrel (PLAVIX) 75 MG tablet Take 75 mg by mouth daily.    docusate sodium (COLACE) 100 MG capsule Take 100 mg by mouth at bedtime as needed for mild constipation.    ezetimibe-simvastatin (VYTORIN) 10-40 MG tablet Take 1 tablet by mouth at bedtime.    gabapentin (NEURONTIN) 600 MG tablet Take 600 mg by mouth 3 (three) times daily. Refills: 5    insulin aspart (NOVOLOG) 100 UNIT/ML injection Inject 15 Units into the skin 3 (three) times daily with meals.    insulin glargine (LANTUS) 100 UNIT/ML injection Inject 65 Units into the skin at bedtime.    isosorbide mononitrate (IMDUR) 60 MG 24 hr tablet Take 1 tablet (60 mg total) by mouth daily. Qty: 30 tablet, Refills: 00    lisinopril (PRINIVIL,ZESTRIL) 40 MG tablet Take 40 mg by mouth daily. Refills: 3    nitroGLYCERIN (NITROSTAT) 0.4 MG SL tablet Place 0.4 mg under the tongue every 5 (five) minutes as needed for chest pain. Reported on 07/10/2015    omeprazole (PRILOSEC) 40 MG capsule Take 40 mg by mouth 2 (two) times daily.    polyethylene glycol  (MIRALAX / GLYCOLAX) packet Take 17 g by mouth daily as needed for mild constipation.    ranolazine (RANEXA) 500 MG 12 hr tablet Take 1 tablet (500 mg total) by mouth 2 (two) times daily. Qty: 60 tablet, Refills: 0    torsemide (DEMADEX) 100 MG tablet Take 100 mg by mouth daily.        If you experience worsening of your admission symptoms, develop shortness of breath, life threatening emergency, suicidal or homicidal thoughts you must seek medical attention immediately by calling 911 or calling your MD immediately  if symptoms less severe.  You Must read complete instructions/literature along with all the possible adverse reactions/side effects for all the Medicines you take and that have been prescribed to you. Take any new Medicines after you have completely understood and accept all the possible adverse reactions/side effects.   Please note  You were cared for by a hospitalist during your hospital stay. If you have any questions about your discharge medications or the care you received while you were in the hospital after you are discharged, you can call the unit and asked to  speak with the hospitalist on call if the hospitalist that took care of you is not available. Once you are discharged, your primary care physician will handle any further medical issues. Please note that NO REFILLS for any discharge medications will be authorized once you are discharged, as it is imperative that you return to your primary care physician (or establish a relationship with a primary care physician if you do not have one) for your aftercare needs so that they can reassess your need for medications and monitor your lab values. Today   SUBJECTIVE   Mild nausea, no vomiting No fever VITAL SIGNS:  Blood pressure 111/57, pulse 74, temperature 97.8 F (36.6 C), temperature source Oral, resp. rate 20, height 5' 9.02" (1.753 m), weight 127.053 kg (280 lb 1.6 oz), last menstrual period 03/16/2015, SpO2 97  %.  I/O:    Intake/Output Summary (Last 24 hours) at 07/19/15 0802 Last data filed at 07/18/15 1905  Gross per 24 hour  Intake   1080 ml  Output   1362 ml  Net   -282 ml    PHYSICAL EXAMINATION:  GENERAL:  51 y.o.-year-old patient lying in the bed with no acute distress.  EYES: Pupils equal, round, reactive to light and accommodation. No scleral icterus. Extraocular muscles intact.  HEENT: Head atraumatic, normocephalic. Oropharynx and nasopharynx clear.  NECK:  Supple, no jugular venous distention. No thyroid enlargement, no tenderness.  LUNGS: Normal breath sounds bilaterally, no wheezing, rales,rhonchi or crepitation. No use of accessory muscles of respiration.  CARDIOVASCULAR: S1, S2 normal. No murmurs, rubs, or gallops.  ABDOMEN: Soft, non-tender, non-distended. Bowel sounds present. No organomegaly or mass. PD cath site looks ok EXTREMITIES: No pedal edema, cyanosis, or clubbing.  NEUROLOGIC: Cranial nerves II through XII are intact. Muscle strength 5/5 in all extremities. Sensation intact. Gait not checked.  PSYCHIATRIC: The patient is alert and oriented x 3.  SKIN: No obvious rash, lesion, or ulcer.   DATA REVIEW:   CBC   Recent Labs Lab 07/17/15 0450  WBC 8.8  HGB 9.4*  HCT 28.2*  PLT 289    Chemistries   Recent Labs Lab 07/15/15 1639  07/17/15 0450  NA 136  --  135  K 4.5  --  4.0  CL 100*  --  100*  CO2 20*  --  24  GLUCOSE 155*  --  145*  BUN 92*  --  77*  CREATININE 10.47*  < > 9.51*  CALCIUM 6.2*  --  6.4*  AST 16  --   --   ALT 13*  --   --   ALKPHOS 102  --   --   BILITOT 0.6  --   --   < > = values in this interval not displayed.  Microbiology Results   Recent Results (from the past 240 hour(s))  Body fluid culture     Status: None (Preliminary result)   Collection Time: 07/15/15 10:23 PM  Result Value Ref Range Status   Specimen Description PERITONEAL  Final   Special Requests Normal  Final   Gram Stain RARE WBC SEEN NO ORGANISMS  SEEN   Final   Culture NO GROWTH 3 DAYS  Final   Report Status PENDING  Incomplete  Culture, blood (routine x 2)     Status: None (Preliminary result)   Collection Time: 07/16/15  3:58 AM  Result Value Ref Range Status   Specimen Description BLOOD RIGHT WRIST  Final   Special Requests   Final  BOTTLES DRAWN AEROBIC AND ANAEROBIC 12CCAERO,6CCANA   Culture NO GROWTH 2 DAYS  Final   Report Status PENDING  Incomplete  Culture, blood (routine x 2)     Status: None (Preliminary result)   Collection Time: 07/16/15  3:58 AM  Result Value Ref Range Status   Specimen Description BLOOD RIGHT FOREARM  Final   Special Requests   Final    BOTTLES DRAWN AEROBIC AND ANAEROBIC 12CCAERO,2CCANA   Culture NO GROWTH 2 DAYS  Final   Report Status PENDING  Incomplete    RADIOLOGY:  No results found.   Management plans discussed with the patient, family and they are in agreement.  CODE STATUS:     Code Status Orders        Start     Ordered   07/16/15 0626  Full code   Continuous     07/16/15 0625    Code Status History    Date Active Date Inactive Code Status Order ID Comments User Context   07/10/2015  1:49 AM 07/12/2015  1:01 PM Full Code [id number]  Oralia Manis, MD Inpatient   05/12/2015  3:46 PM 05/16/2015  5:35 PM Full Code [id number]  Auburn Bilberry, MD ED      TOTAL TIME TAKING CARE OF THIS PATIENT: 40 minutes.    Gram Siedlecki M.D  Between 7am to 6pm - Pager - [phone] After 6pm go to [email or web address] - password [email or web address] [facility]  [place] [facility]  Office  [phone]  CC: Primary care physician; Jarome Matin

## 2015-07-19 NOTE — Care Management (Signed)
Discussed fall with PT. They will reevaluate patient.

## 2015-07-19 NOTE — Progress Notes (Signed)
Pt complaining of "squeezing pain down my left leg that is worse than I've ever had before." Pt said it felt like this a little yesterday and overnight, but now she is tearful saying she needs something for pain. Spoke to Dr. Allena Katz, patient currently only has morphine ordered for pain. Per MD order 1 tablet of percocet 5-325 PO every 6 hours as needed. Still waiting for neurologist and PT to come see patient.

## 2015-07-19 NOTE — Consult Note (Signed)
Reason for Consult:Myoclonus Referring Physician: Allena Katz  CC: Myoclonus  HPI: Kimberly Montgomery is an 50 y.o. female who presented to the emergency room with vomiting and diarrhea for 2-3 days. Patient recently had a cardiac catheter and was discharged on Wednesday PTA after going home.  Patient found to have peritoneal fluid and was sent for analysis.  Was found to be infected. She was given IV antibiotics for peritonitis in the emergency room and hospitalist service was consulted for further care. Over the past 1-2 days has developed all over body jerks.  Etiology unclear.    Past Medical History  Diagnosis Date  . Renal disorder   . Hypertension   . CHF (congestive heart failure) (HCC)   . Diabetes mellitus without complication (HCC)   . Gastroparesis   . Asthma   . ESRD (end stage renal disease) (HCC)   . Endometriosis   . CAD (coronary artery disease)   . HLD (hyperlipidemia)   . GERD (gastroesophageal reflux disease)     Past Surgical History  Procedure Laterality Date  . Hysterotomy    . Below knee leg amputation    . Pd tube inserstion    . Hd fistula surgery with reversal    . Cardiac catheterization Right 07/10/2015    Procedure: Left Heart Cath and Coronary Angiography;  Surgeon: Laurier Nancy, MD;  Location: [facility] INVASIVE CV LAB;  Service: Cardiovascular;  Laterality: Right;    Family History  Problem Relation Age of Onset  . CAD    . Diabetes    . Bipolar disorder    . Cervical cancer Mother     Social History:  reports that she has been smoking Cigarettes.  She has been smoking about 0.50 packs per day. She does not have any smokeless tobacco history on file. She reports that she does not drink alcohol or use illicit drugs.  Allergies  Allergen Reactions  . Cephalosporins Anaphylaxis  . Penicillins Anaphylaxis and Other (See Comments)    Has patient had a PCN reaction causing immediate rash, facial/tongue/throat swelling, SOB or lightheadedness with  hypotension: Yes Has patient had a PCN reaction causing severe rash involving mucus membranes or skin necrosis: No Has patient had a PCN reaction that required hospitalization No Has patient had a PCN reaction occurring within the last 10 years: No If all of the above answers are "NO", then may proceed with Cephalosporin use.  [name] Phenergan [Promethazine Hcl] Nausea And Vomiting    Medications:  I have reviewed the patient's current medications. Prior to Admission:  Prescriptions prior to admission  Medication Sig Dispense Refill Last Dose  . acetaminophen (TYLENOL) 500 MG tablet Take 1,000 mg by mouth every 6 (six) hours as needed for mild pain.    07/15/2015 at Unknown time  . albuterol (PROVENTIL HFA;VENTOLIN HFA) 108 (90 Base) MCG/ACT inhaler Inhale 2 puffs into the lungs every 6 (six) hours as needed for wheezing or shortness of breath.   07/15/2015 at Unknown time  . amLODipine (NORVASC) 10 MG tablet Take 10 mg by mouth daily.   07/15/2015 at Unknown time  . aspirin EC 81 MG tablet Take 81 mg by mouth daily.   07/15/2015 at Unknown time  . aspirin-acetaminophen-caffeine (EXCEDRIN MIGRAINE) 250-250-65 MG tablet Take 2 tablets by mouth every 6 (six) hours as needed for headache.   PRN at PRN  . buPROPion (WELLBUTRIN SR) 150 MG 12 hr tablet Take 150 mg by mouth daily.   07/15/2015 at Unknown time  .  calcium acetate, Phos Binder, (PHOSLYRA) 667 MG/5ML SOLN Take 2,668 mg by mouth 3 (three) times daily with meals.    07/15/2015 at Unknown time  . carvedilol (COREG) 6.25 MG tablet Take 6.25 mg by mouth 2 (two) times daily with a meal.   07/15/2015 at Unknown time  . clopidogrel (PLAVIX) 75 MG tablet Take 75 mg by mouth daily.   07/15/2015 at Unknown time  . docusate sodium (COLACE) 100 MG capsule Take 100 mg by mouth at bedtime as needed for mild constipation.   PRN at PRN  . ezetimibe-simvastatin (VYTORIN) 10-40 MG tablet Take 1 tablet by mouth at bedtime.   07/14/2015 at Unknown time  . gabapentin  (NEURONTIN) 600 MG tablet Take 600 mg by mouth 3 (three) times daily.  5 07/15/2015 at Unknown time  . insulin aspart (NOVOLOG) 100 UNIT/ML injection Inject 15 Units into the skin 3 (three) times daily with meals.   07/15/2015 at Unknown time  . insulin glargine (LANTUS) 100 UNIT/ML injection Inject 65 Units into the skin at bedtime.   07/14/2015 at Unknown time  . isosorbide mononitrate (IMDUR) 60 MG 24 hr tablet Take 1 tablet (60 mg total) by mouth daily. 30 tablet 00 07/15/2015 at Unknown time  . lisinopril (PRINIVIL,ZESTRIL) 40 MG tablet Take 40 mg by mouth daily.  3 07/15/2015 at Unknown time  . nitroGLYCERIN (NITROSTAT) 0.4 MG SL tablet Place 0.4 mg under the tongue every 5 (five) minutes as needed for chest pain. Reported on 07/10/2015   PRN at PRN  . omeprazole (PRILOSEC) 40 MG capsule Take 40 mg by mouth 2 (two) times daily.   07/15/2015 at Unknown time  . polyethylene glycol (MIRALAX / GLYCOLAX) packet Take 17 g by mouth daily as needed for mild constipation.   PRN at PRN  . ranolazine (RANEXA) 500 MG 12 hr tablet Take 1 tablet (500 mg total) by mouth 2 (two) times daily. 60 tablet 0 07/15/2015 at Unknown time  . torsemide (DEMADEX) 100 MG tablet Take 100 mg by mouth daily.   07/15/2015 at Unknown time   Scheduled: . amLODipine  10 mg Oral Daily  . aspirin EC  81 mg Oral Daily  . buPROPion  150 mg Oral Daily  . carvedilol  6.25 mg Oral BID WC  . clopidogrel  75 mg Oral Daily  . dialysis solution 2.5% low-MG/low-CA   Intraperitoneal Q24H  . dialysis solution 2.5% low-MG/low-CA   Intraperitoneal Q24H  . ezetimibe  10 mg Oral QHS  . gabapentin  600 mg Oral TID  . gentamicin cream  1 application Topical Daily  . heparin  5,000 Units Subcutaneous 3 times per day  . insulin aspart  0-5 Units Subcutaneous QHS  . insulin aspart  0-9 Units Subcutaneous TID WC  . isosorbide mononitrate  60 mg Oral Daily  . levofloxacin  500 mg Oral Q48H  . lisinopril  40 mg Oral Daily  . pantoprazole  40 mg Oral  BID  . ranolazine  500 mg Oral BID  . sodium chloride flush  3 mL Intravenous Q12H    ROS: History obtained from the patient  General ROS: negative for - chills, fatigue, fever, night sweats, weight gain or weight loss Psychological ROS: negative for - behavioral disorder, hallucinations, memory difficulties, mood swings or suicidal ideation Ophthalmic ROS: negative for - blurry vision, double vision, eye pain or loss of vision ENT ROS: negative for - epistaxis, nasal discharge, oral lesions, sore throat, tinnitus or vertigo Allergy and Immunology ROS:  negative for - hives or itchy/watery eyes Hematological and Lymphatic ROS: negative for - bleeding problems, bruising or swollen lymph nodes Endocrine ROS: negative for - galactorrhea, hair pattern changes, polydipsia/polyuria or temperature intolerance Respiratory ROS: negative for - cough, hemoptysis, shortness of breath or wheezing Cardiovascular ROS: negative for - chest pain, dyspnea on exertion, edema or irregular heartbeat Gastrointestinal ROS: negative for - abdominal pain, diarrhea, hematemesis, nausea/vomiting or stool incontinence Genito-Urinary ROS: negative for - dysuria, hematuria, incontinence or urinary frequency/urgency Musculoskeletal ROS: left leg pain Neurological ROS: as noted in HPI Dermatological ROS: negative for rash and skin lesion changes  Physical Examination: Blood pressure 127/59, pulse 75, temperature 97.6 F (36.4 C), temperature source Oral, resp. rate 18, height 5' 9.02" (1.753 m), weight 127.053 kg (280 lb 1.6 oz), last menstrual period 03/16/2015, SpO2 93 %.  HEENT-  Normocephalic, no lesions, without obvious abnormality.  Normal external eye and conjunctiva.  Normal TM's bilaterally.  Normal auditory canals and external ears. Normal external nose, mucus membranes and septum.  Normal pharynx. Cardiovascular- S1, S2 normal, pulses palpable throughout   Lungs- chest clear, no wheezing, rales, normal  symmetric air entry Abdomen- soft, non-tender; bowel sounds normal; no masses,  no organomegaly Extremities- no edema Lymph-no adenopathy palpable Musculoskeletal-no joint tenderness, deformity or swelling Skin-warm and dry, no hyperpigmentation, vitiligo, or suspicious lesions  Neurological Examination Mental Status: Alert, oriented, thought content appropriate.  Speech fluent without evidence of aphasia.  Able to follow 3 step commands without difficulty. Cranial Nerves: II: Discs flat bilaterally; Visual fields grossly normal, pupils equal, round, reactive to light and accommodation III,IV, VI: ptosis not present, extra-ocular motions intact bilaterally V,VII: smile symmetric, facial light touch sensation normal bilaterally VIII: hearing normal bilaterally IX,X: gag reflex present XI: bilateral shoulder shrug XII: midline tongue extension Motor: Right : Upper extremity   5/5    Left:     Upper extremity   5/5  Lower extremity   5/5     Lower extremity   Patient unable to lift due to pain. Gives full strength distally.  Sensory: Pain on light palpation of the lateral aspect of the left thigh with less pain medially.  No pain below the knee.   Deep Tendon Reflexes: 2+ in the upper extremities, absent in the lower extremities Plantars: Right: mute   Left: mute Cerebellar: Normal finger-to-nose testing with mild tremor noted.   Gait: not tested due to safety concerns   Laboratory Studies:   Basic Metabolic Panel:  Recent Labs Lab 07/15/15 1639 07/16/15 0803 07/17/15 0450  NA 136  --  135  K 4.5  --  4.0  CL 100*  --  100*  CO2 20*  --  24  GLUCOSE 155*  --  145*  BUN 92*  --  77*  CREATININE 10.47* 9.44* 9.51*  CALCIUM 6.2*  --  6.4*    Liver Function Tests:  Recent Labs Lab 07/15/15 1639  AST 16  ALT 13*  ALKPHOS 102  BILITOT 0.6  PROT 5.8*  ALBUMIN 2.7*    Recent Labs Lab 07/15/15 1639  LIPASE 23   No results for input(s): AMMONIA in the last 168  hours.  CBC:  Recent Labs Lab 07/15/15 1639 07/16/15 0803 07/17/15 0450  WBC 9.7 10.8 8.8  HGB 10.7* 9.4* 9.4*  HCT 31.8* 29.0* 28.2*  MCV 83.8 83.8 84.0  PLT 352 322 289    Cardiac Enzymes:  Recent Labs Lab 07/15/15 2111  TROPONINI 0.05*    BNP: Invalid  input(s): POCBNP  CBG:  Recent Labs Lab 07/18/15 1204 07/18/15 1636 07/18/15 2050 07/19/15 0731 07/19/15 1226  GLUCAP 149* 121* 125* 140* 143*    Microbiology: Results for orders placed or performed during the hospital encounter of 07/15/15  Body fluid culture     Status: None   Collection Time: 07/15/15 10:23 PM  Result Value Ref Range Status   Specimen Description PERITONEAL  Final   Special Requests Normal  Final   Gram Stain RARE WBC SEEN NO ORGANISMS SEEN   Final   Culture No growth aerobically or anaerobically.  Final   Report Status 07/19/2015 FINAL  Final  Culture, blood (routine x 2)     Status: None (Preliminary result)   Collection Time: 07/16/15  3:58 AM  Result Value Ref Range Status   Specimen Description BLOOD RIGHT WRIST  Final   Special Requests   Final    BOTTLES DRAWN AEROBIC AND ANAEROBIC 12CCAERO,6CCANA   Culture NO GROWTH 3 DAYS  Final   Report Status PENDING  Incomplete  Culture, blood (routine x 2)     Status: None (Preliminary result)   Collection Time: 07/16/15  3:58 AM  Result Value Ref Range Status   Specimen Description BLOOD RIGHT FOREARM  Final   Special Requests   Final    BOTTLES DRAWN AEROBIC AND ANAEROBIC 12CCAERO,2CCANA   Culture NO GROWTH 3 DAYS  Final   Report Status PENDING  Incomplete    Coagulation Studies: No results for input(s): LABPROT, INR in the last 72 hours.  Urinalysis:  Recent Labs Lab 07/15/15 2223  COLORURINE YELLOW*  LABSPEC 1.015  PHURINE 6.0  GLUCOSEU >500*  HGBUR 1+*  BILIRUBINUR NEGATIVE  KETONESUR TRACE*  PROTEINUR >500*  NITRITE NEGATIVE  LEUKOCYTESUR NEGATIVE    Lipid Panel:     Component Value Date/Time   CHOL 275*  07/11/2015 0504   TRIG 271* 07/11/2015 0504   HDL 76 07/11/2015 0504   CHOLHDL 3.6 07/11/2015 0504   VLDL 54* 07/11/2015 0504   LDLCALC 145* 07/11/2015 0504    HgbA1C:  Lab Results  Component Value Date   HGBA1C 8.6* 07/18/2015    Urine Drug Screen:  No results found for: LABOPIA, COCAINSCRNUR, LABBENZ, AMPHETMU, THCU, LABBARB  Alcohol Level: No results for input(s): ETH in the last 168 hours.  Other results: EKG: sinus rhythm at 91 bpm.  Imaging: No results found.   Assessment/Plan: 50 year old female with ESRD and new onset myoclonus.  On Neurontin at 1800mg  a day.  Patient also on Wellbutrin.  Renal function declined from baseline.  Likely related to medications in clinical scenario of worsening renal function. Also with complaints of LLE pain and weakness.  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    Recommendations: 1.  D/C Neurontin 2.  CT of head without contrast 3.  CT of lumbar spine   Thana Farr, MD Neurology [phone] 07/19/2015, 3:48 PM

## 2015-07-19 NOTE — Progress Notes (Signed)
Patient fell at approximately 0815 this morning, vitals taken at 0820 with MD Enedina Finner at bedside. Pt wants to notify son herself instead of staff calling.   Upon initial rounding with bedside report, patient was resting quietly in bed with alarm off (previous shift scored moderate fall risk), only complaints were leg and arm twitching/jerking, patient stating she does not feel like she should go home today. This RN with went Boneta Lucks NT to stand patient on scale for daily weight, post PD. Pt stood fine but twitching/jerking became more dramatic upon returning to bed. Dr. Allena Katz was making rounds at this time, patient was placed back in bed, set up for breakfast, and alarm was turned on by this RN (concerned that patient might try to get up while this jerking was happening). BP/HR was checked and this RN went to grab morning meds for her. Few minutes later, patient called NT saying she had spilled coffee all in her bed and needed to be cleaned up. Boneta Lucks NT and [place] NT went into room, assisted patient to standing position. Per Thayer Ohm NT, patient stepped on his foot and jerked backwards. He was able to assist her to the ground without injury. This RN and Dr. Allena Katz came into room, patient was sitting up straight on buttocks, but continued to have occasional jerking movements. Ativan IV push was given per MD order, vitals were taken, and lift was used to get patient back into bed as patient felt she could not assist [place]. Dr. Allena Katz ordered neuro consult for these jerking movements. Patient stated she was not hurt but her buttocks and hips might be a little sore.   Patient was immediately changed to a high fall risk, yellow socks tied to end of bed as patient did not want to put them on until she had to get up, yellow bracelet placed on wrist, and fall safety contract HIGH RISK signed. Bed alarm turned back on. Patient sitting up in bed eating breakfast at this time. No complaints. Will continue to monitor.

## 2015-07-19 NOTE — Progress Notes (Signed)
Pre-peritoneal dialysis connection. 

## 2015-07-19 NOTE — Care Management (Signed)
Walker delivered to room by [facility]

## 2015-07-19 NOTE — Progress Notes (Signed)
Patient given discharge teaching and paperwork regarding medications, diet, follow-up appointments and activity. Patient understanding verbalized. No complaints at this time. IV and telemetry to be discontinued prior to leaving. Skin assessment as previously charted and vitals are stable; on room air. Patient being discharged to home. No further needs by Care Management. Prescriptions sent to pharmacy by physician. Eliquis coupon given to patient by CM. Waiting for daughter to come pick her up.

## 2015-07-19 NOTE — Progress Notes (Signed)
Peritoneal dialysis started.

## 2015-07-19 NOTE — Progress Notes (Signed)
Patient ID: Kimberly Montgomery, female   DOB: 10-28-1965, 50 y.o.   MRN: [id number] Pt continues to have jerky movements and while trying to eat breakfast she spilled her coffee. Tech was trying to change her and she had another spell where she jerked backwards and then slid down on hte floor. No trauma witnessed will get neurology see pt. Not sure if this could be due to elevated BUN/uremia. Her BUN is in the 70-90's and before it was30-50's. Spoke with dr Thad Ranger to see pt Hold d/c

## 2015-07-19 NOTE — Progress Notes (Signed)
Physical Therapy Treatment Patient Details Name: Kimberly Montgomery MRN: [id number] DOB: 07-Jun-1965 Today's Date: 07/19/2015    History of Present Illness Pt is a 50 y.o. F admitted to hospital for peritonitis. Pt was perviously admitted to hospital for cardiac cath and discharged on 4/12. Pt stated she has been vomiting since previous discharge. Pt currently on peritoneal dialysis. Pt has hx of end stage renal disease, CHF, HTN, and GERD. 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     PT Comments    Pt is making good progress towards goals with increased complaints of dizziness/pain this date. Pt with reported fall earlier this AM with nursing staff. +2 used for safety. RW used. Limited ambulation performed, safe technique, however pt complains of dizziness increasing with change in position. Orthostatic vitals assessed: Supine: 115/51; Seated: 111/57; Standing: 108/53. No buckling in B LE observed. Pt educated to perform position changes slowly. Still recommending HHPT at this time, however will continue to assess next date.  Follow Up Recommendations   (HHPT vs SNF depending on disposition next date. )     Equipment Recommendations  Rolling walker with 5" wheels    Recommendations for Other Services       Precautions / Restrictions Precautions Precautions: Fall Restrictions Weight Bearing Restrictions: No    Mobility  Bed Mobility Overal bed mobility: Needs Assistance Bed Mobility: Supine to Sit     Supine to sit: Min assist     General bed mobility comments: assist for bed mobility. Pt complains of pain with all movement. Once seated at EOB, pt complains of dizziness.  Transfers Overall transfer level: Needs assistance Equipment used: Rolling walker (2 wheeled) Transfers: Sit to/from Stand Sit to Stand: Min assist;+2 physical assistance         General transfer comment: asssit for standing with additional person for safety. Pt able to stand, however complains of  increased dizziness with standing. RW adjusted to pt height  Ambulation/Gait Ambulation/Gait assistance: Min assist Ambulation Distance (Feet): 3 Feet Assistive device: Rolling walker (2 wheeled) Gait Pattern/deviations: Step-to pattern     General Gait Details: Pt ambulated short distance in room, however complains of increased dizziness with ambulation. Further distance deferred   Stairs            Wheelchair Mobility    Modified Rankin (Stroke Patients Only)       Balance                                    Cognition Arousal/Alertness: Awake/alert Behavior During Therapy: WFL for tasks assessed/performed Overall Cognitive Status: Within Functional Limits for tasks assessed                      Exercises      General Comments        Pertinent Vitals/Pain Pain Assessment: 0-10 Pain Score: 8  Pain Location: dizziness and pain in B LEs Pain Descriptors / Indicators: Dull;Pressure Pain Intervention(s): Limited activity within patient's tolerance    Home Living                      Prior Function            PT Goals (current goals can now be found in the care plan section) Acute Rehab PT Goals Patient Stated Goal: to return to PLOF PT Goal Formulation: With patient Potential to  Achieve Goals: Good Progress towards PT goals: Progressing toward goals    Frequency  Min 2X/week    PT Plan Discharge plan needs to be updated    Co-evaluation             End of Session Equipment Utilized During Treatment: Gait belt Activity Tolerance: Patient limited by pain Patient left: in bed;with bed alarm set     Time: 4888-9169 PT Time Calculation (min) (ACUTE ONLY): 19 min  Charges:  $Gait Training: 8-22 mins                    G Codes:      Jamerson Vonbargen 07-31-2015, 5:00 PM Elizabeth Palau, PT, DPT [phone]

## 2015-07-19 NOTE — Progress Notes (Signed)
Post peritoneal dialysis treatment

## 2015-07-20 LAB — GLUCOSE, CAPILLARY
GLUCOSE-CAPILLARY: 185 mg/dL — AB (ref 65–99)
GLUCOSE-CAPILLARY: 169 mg/dL — AB (ref 65–99)
Glucose-Capillary: 156 mg/dL — ABNORMAL HIGH (ref 65–99)
Glucose-Capillary: 212 mg/dL — ABNORMAL HIGH (ref 65–99)

## 2015-07-20 LAB — PHOSPHORUS: Phosphorus: 11.7 mg/dL — ABNORMAL HIGH (ref 2.5–4.6)

## 2015-07-20 MED ORDER — OXYCODONE-ACETAMINOPHEN 5-325 MG PO TABS
1.0000 | ORAL_TABLET | Freq: Four times a day (QID) | ORAL | Status: DC | PRN
  Administered 2015-07-20 – 2015-08-02 (×8): 2 via ORAL
  Filled 2015-07-20 (×10): qty 2

## 2015-07-20 MED ORDER — CALCIUM ACETATE (PHOS BINDER) 667 MG PO CAPS
2001.0000 mg | ORAL_CAPSULE | Freq: Three times a day (TID) | ORAL | Status: DC
  Administered 2015-07-20 – 2015-08-01 (×22): 2001 mg via ORAL
  Filled 2015-07-20 (×24): qty 3

## 2015-07-20 MED ORDER — ALPRAZOLAM 0.25 MG PO TABS
0.2500 mg | ORAL_TABLET | Freq: Two times a day (BID) | ORAL | Status: DC | PRN
  Administered 2015-07-20: 0.25 mg via ORAL
  Filled 2015-07-20 (×3): qty 1

## 2015-07-20 MED ORDER — DOCUSATE SODIUM 100 MG PO CAPS
100.0000 mg | ORAL_CAPSULE | Freq: Two times a day (BID) | ORAL | Status: DC
  Administered 2015-07-20 – 2015-07-23 (×6): 100 mg via ORAL
  Filled 2015-07-20 (×7): qty 1

## 2015-07-20 MED ORDER — OXYCODONE-ACETAMINOPHEN 5-325 MG PO TABS: 1.0000 | ORAL_TABLET | Freq: Four times a day (QID) | ORAL | Status: DC | PRN

## 2015-07-20 MED ORDER — CLINDAMYCIN PHOSPHATE 300 MG/50ML IV SOLN
300.0000 mg | Freq: Once | INTRAVENOUS | Status: DC
  Filled 2015-07-20: qty 50

## 2015-07-20 NOTE — Clinical Social Work Note (Signed)
CSW informed by RN CM that PT was recommending rehab now for patient. CSW was going to speak with patient and daughter regarding this recommendation but patient is now to transfer to [facility] today. Dr. Allena Katz documented that patient was willing to convert to hemodialysis to go to STR but after speaking with her daughter, declined, thus STR in [place] is not an option due to no rehab facilities accepting PD at this time. York Spaniel MSW,LCSW [phone]

## 2015-07-20 NOTE — Progress Notes (Signed)
Subjective: Patient reports that she feels the jerking has gotten worse.  Now off Neurontin.  Continues to have LLE pain.    Objective: Current vital signs: BP 121/62 mmHg  Pulse 59  Temp(Src) 98.1 F (36.7 C) (Oral)  Resp 18  Ht 5' 9.02" (1.753 m)  Wt 127.053 kg (280 lb 1.6 oz)  BMI 41.34 kg/m2  SpO2 94%  LMP 03/16/2015 Vital signs in last 24 hours: Temp:  [98 F (36.7 C)-98.5 F (36.9 C)] 98.1 F (36.7 C) (04/20 1202) Pulse Rate:  [59-75] 59 (04/20 1202) Resp:  [18-20] 18 (04/20 1202) BP: (91-132)/(44-62) 121/62 mmHg (04/20 1202) SpO2:  [93 %-95 %] 94 % (04/20 0411)  Intake/Output from previous day: 04/19 0701 - 04/20 0700 In: 120 [P.O.:120] Out: 1036 [Urine:125] Intake/Output this shift: Total I/O In: 240 [P.O.:240] Out: 812 [Other:812] Nutritional status: DIET SOFT Room service appropriate?: Yes; Fluid consistency:: Thin Diet - low sodium heart healthy Diet - low sodium heart healthy  Neurologic Exam: Mental Status: Alert, oriented, thought content appropriate. Speech fluent without evidence of aphasia. Able to follow 3 step commands without difficulty. Cranial Nerves: II: Discs flat bilaterally; Visual fields grossly normal, pupils equal, round, reactive to light and accommodation III,IV, VI: ptosis not present, extra-ocular motions intact bilaterally V,VII: smile symmetric, facial light touch sensation normal bilaterally VIII: hearing normal bilaterally IX,X: gag reflex present XI: bilateral shoulder shrug XII: midline tongue extension Motor: Right :Upper extremity 5/5Left: Upper extremity 5/5 Lower extremity 5/5Lower extremity Patient unable to lift due to pain. Gives full strength distally.  Sensory: Pain on light palpation of the lateral aspect of the left thigh with less pain medially. No pain below the knee.  Deep Tendon Reflexes: 2+ in  the upper extremities, absent in the lower extremities  Lab Results: Basic Metabolic Panel:  Recent Labs Lab 07/15/15 1639 07/16/15 0803 07/17/15 0450 07/20/15 0547  NA 136  --  135  --   K 4.5  --  4.0  --   CL 100*  --  100*  --   CO2 20*  --  24  --   GLUCOSE 155*  --  145*  --   BUN 92*  --  77*  --   CREATININE 10.47* 9.44* 9.51*  --   CALCIUM 6.2*  --  6.4*  --   PHOS  --   --   --  11.7*    Liver Function Tests:  Recent Labs Lab 07/15/15 1639  AST 16  ALT 13*  ALKPHOS 102  BILITOT 0.6  PROT 5.8*  ALBUMIN 2.7*    Recent Labs Lab 07/15/15 1639  LIPASE 23   No results for input(s): AMMONIA in the last 168 hours.  CBC:  Recent Labs Lab 07/15/15 1639 07/16/15 0803 07/17/15 0450  WBC 9.7 10.8 8.8  HGB 10.7* 9.4* 9.4*  HCT 31.8* 29.0* 28.2*  MCV 83.8 83.8 84.0  PLT 352 322 289    Cardiac Enzymes:  Recent Labs Lab 07/15/15 2111  TROPONINI 0.05*    Lipid Panel: No results for input(s): CHOL, TRIG, HDL, CHOLHDL, VLDL, LDLCALC in the last 168 hours.  CBG:  Recent Labs Lab 07/19/15 1226 07/19/15 1705 07/19/15 2046 07/20/15 0741 07/20/15 1144  GLUCAP 143* 146* 99 185* 169*    Microbiology: Results for orders placed or performed during the hospital encounter of 07/15/15  Body fluid culture     Status: None   Collection Time: 07/15/15 10:23 PM  Result Value Ref Range Status  Specimen Description PERITONEAL  Final   Special Requests Normal  Final   Gram Stain RARE WBC SEEN NO ORGANISMS SEEN   Final   Culture No growth aerobically or anaerobically.  Final   Report Status 07/19/2015 FINAL  Final  Culture, blood (routine x 2)     Status: None (Preliminary result)   Collection Time: 07/16/15  3:58 AM  Result Value Ref Range Status   Specimen Description BLOOD RIGHT WRIST  Final   Special Requests   Final    BOTTLES DRAWN AEROBIC AND ANAEROBIC 12CCAERO,6CCANA   Culture NO GROWTH 4 DAYS  Final   Report Status PENDING  Incomplete   Culture, blood (routine x 2)     Status: None (Preliminary result)   Collection Time: 07/16/15  3:58 AM  Result Value Ref Range Status   Specimen Description BLOOD RIGHT FOREARM  Final   Special Requests   Final    BOTTLES DRAWN AEROBIC AND ANAEROBIC 12CCAERO,2CCANA   Culture NO GROWTH 4 DAYS  Final   Report Status PENDING  Incomplete    Coagulation Studies: No results for input(s): LABPROT, INR in the last 72 hours.  Imaging: Ct Head Wo Contrast  07/19/2015  CLINICAL DATA:  Left leg weakness. EXAM: CT HEAD WITHOUT CONTRAST TECHNIQUE: Contiguous axial images were obtained from the base of the skull through the vertex without intravenous contrast. COMPARISON:  05/23/2004 FINDINGS: Skull and Sinuses:Negative for fracture or destructive process. The visualized mastoids, middle ears, and imaged paranasal sinuses are clear. Visualized orbits: Interval bilateral cataract resection. Brain: No acute or remote infarction, hemorrhage, hydrocephalus, or mass lesion/mass effect. Normal cerebral volume. Intermittently poor visualization of gray-white differentiation is attributed to scanner/technique. IMPRESSION: No acute finding or explanation for leg weakness. Electronically Signed   By: Marnee Spring M.D.   On: 07/19/2015 16:55   Ct Lumbar Spine Wo Contrast  07/19/2015  CLINICAL DATA:  Squeezing pain in the left leg.  Left leg weakness. EXAM: CT LUMBAR SPINE WITHOUT CONTRAST TECHNIQUE: Multidetector CT imaging of the lumbar spine was performed without intravenous contrast administration. Multiplanar CT image reconstructions were also generated. COMPARISON:  06/03/2013, 10/26/2013, 07/15/2015 FINDINGS: The alignment is anatomic. The vertebral body heights are maintained. There is no acute fracture or static listhesis. The paravertebral soft tissues are normal. The intraspinal soft tissues are not fully imaged on this examination due to poor soft tissue contrast, but there is no gross soft tissue  abnormality. There is degenerative disc disease at L3-4. There are Schmorl's nodes in the inferior endplate of L3 and superior endplate of L4. The endplate changes involving the L4 vertebral body have progressed compared with the prior exam. The remainder the disc spaces are relatively well preserved. At T12-L1 there is no significant disc protrusion, foraminal stenosis or central canal stenosis. At L1-2 there is no significant disc protrusion, foraminal stenosis or central canal stenosis. At L2-3 there is no significant disc protrusion, foraminal stenosis or central canal stenosis. At L3-4 there is a mild broad-based disc bulge. There is mild bilateral facet arthropathy. There is no significant foraminal or central canal stenosis. At L4-5 there is a broad-based disc bulge. There is severe right and mild left facet arthropathy. No significant foraminal or central canal stenosis. At L5-S1 there is moderate right and mild left facet arthropathy. No foraminal or central canal stenosis. There are degenerative changes of bilateral sacroiliac joints. IMPRESSION: 1. No acute injury of the lumbar spine. 2. Degenerative disc disease L3-4 with a mild broad-based disc bulge  and mild bilateral facet arthropathy. 3. At L4-5 there is severe right and mild left facet arthropathy. 4. At L5-S1 there is moderate right and mild left facet arthropathy. Electronically Signed   By: Elige Ko   On: 07/19/2015 17:03    Medications:  I have reviewed the patient's current medications. Scheduled: . amLODipine  10 mg Oral Daily  . aspirin EC  81 mg Oral Daily  . buPROPion  150 mg Oral Daily  . calcium acetate  2,001 mg Oral TID WC  . carvedilol  6.25 mg Oral BID WC  . clopidogrel  75 mg Oral Daily  . dialysis solution 2.5% low-MG/low-CA   Intraperitoneal Q24H  . dialysis solution 2.5% low-MG/low-CA   Intraperitoneal Q24H  . ezetimibe  10 mg Oral QHS  . gentamicin cream  1 application Topical Daily  . heparin  5,000 Units  Subcutaneous 3 times per day  . insulin aspart  0-5 Units Subcutaneous QHS  . insulin aspart  0-9 Units Subcutaneous TID WC  . isosorbide mononitrate  60 mg Oral Daily  . levofloxacin  500 mg Oral Q48H  . lisinopril  40 mg Oral Daily  . pantoprazole  40 mg Oral BID  . ranolazine  500 mg Oral BID  . sodium chloride flush  3 mL Intravenous Q12H    Assessment/Plan: NO jerking noted during examination but patient reports that it is worse.  May not have completely cleared Neurontin as of yet.  Unclear contribution of Wellbutrin as well.  Head CT personally reviewed and shows no acute changes.  CT of lumbar spine shows no abnormalities to explain complaints.  Recommendations: 1.  D/C Wellbutrin      LOS: 4 days   Thana Farr, MD Neurology [phone] 07/20/2015  12:59 PM

## 2015-07-20 NOTE — Progress Notes (Signed)
Physical Therapy Treatment Patient Details Name: Kimberly Montgomery MRN: [id number] DOB: January 20, 1966 Today's Date: 07/20/2015    History of Present Illness Pt is a 50 y.o. F admitted to hospital for peritonitis. Pt was perviously admitted to hospital for cardiac cath and discharged on 4/12. Pt stated she has been vomiting since previous discharge. Pt currently on peritoneal dialysis. Pt has hx of end stage renal disease, CHF, HTN, and GERD. 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     PT Comments    Pt is currently not progressing towards goals. Pt stated she is in a lot of pain at start of treatment. Pt able to perform bed mobility with 2+ mod assist. Pt able to transfer using RW and 2+ mod assist. Pt able to ambulate approx 5 ft in room with RW and 2+ mod assist. Ambulation limited by pts pain in L LE. Pt performed seated there-ex on B LE with min to no assist. There-ex limited by pts pain. Pt demonstrates deficits in strength and mobility and increased pain. Pt would benefit from further skilled therapy to address deficits and return to PLOF; recommend pt sent to SNF after discharge from acute hospitalization.   Follow Up Recommendations  SNF     Equipment Recommendations  Rolling walker with 5" wheels    Recommendations for Other Services       Precautions / Restrictions Precautions Precautions: Fall Restrictions Weight Bearing Restrictions: No    Mobility  Bed Mobility Overal bed mobility: Needs Assistance Bed Mobility: Supine to Sit     Supine to sit: Mod assist;+2 for physical assistance     General bed mobility comments: Pt required 2+ mod assist to perform bed mobility. Pt required assist with B LE and uprighting trunk. Pt provided heavy cues regarding hand placement during mobility. Pt required increased time to sit up due to extreme pain in L LE.   Transfers Overall transfer level: Needs assistance Equipment used: Rolling walker (2 wheeled) Transfers: Sit to/from  Stand Sit to Stand: +2 physical assistance;Mod assist         General transfer comment: Pt required 2+ mod assist and RW for transfer from EOB. Pt provided cues regarding hand placement with RW. Pt required increased time to stand and maintain balance once standing due to pain.   Ambulation/Gait Ambulation/Gait assistance: +2 physical assistance;Mod assist Ambulation Distance (Feet): 5 Feet Assistive device: Rolling walker (2 wheeled) Gait Pattern/deviations: Step-through pattern Gait velocity: slow   General Gait Details: Pt able to ambulate approx 5 ft in room using 2+ mod assist. Ambulation limited by pts pain. Had someone follow with recliner, pt had to sit down after ambulation. Pt demonstrated very slow gait pattern.    Stairs            Wheelchair Mobility    Modified Rankin (Stroke Patients Only)       Balance                                    Cognition Arousal/Alertness: Awake/alert Behavior During Therapy: WFL for tasks assessed/performed Overall Cognitive Status: Within Functional Limits for tasks assessed                      Exercises Other Exercises Other Exercises: Pt performed seated ther-ex on B LE including iso hip abd and ankle pumps on L leg, SLR and hip abd on R  leg. Pt required min assist w/SLR and hip abd. Ther-ex limited on L side due to pain.    General Comments        Pertinent Vitals/Pain Pain Assessment: 0-10 Pain Score: 9  Pain Location: L leg/hip Pain Descriptors / Indicators: Jabbing;Aching Pain Intervention(s): Limited activity within patient's tolerance    Home Living                      Prior Function            PT Goals (current goals can now be found in the care plan section) Acute Rehab PT Goals Patient Stated Goal: to return to PLOF PT Goal Formulation: With patient Potential to Achieve Goals: Fair Progress towards PT goals: Not progressing toward goals - comment (Pt shows  decline since initial eval d/t pain)    Frequency  Min 2X/week    PT Plan Discharge plan needs to be updated    Co-evaluation             End of Session Equipment Utilized During Treatment: Gait belt Activity Tolerance: Patient limited by pain Patient left: in chair;with call bell/phone within reach;with chair alarm set     Time: 1610-9604 PT Time Calculation (min) (ACUTE ONLY): 25 min  Charges:                       G Codes:      Dorita Fray 2015-08-10, 11:48 AM M. Hettie Holstein, SPT

## 2015-07-20 NOTE — Progress Notes (Signed)
Patient very tearful. Per Dr. Allena Katz okay to give Xanax .25mg  BID PRN to help with anxiety. Will continue to monitor patient. Kimberly Montgomery

## 2015-07-20 NOTE — Progress Notes (Signed)
Subjective:   Peritoneal dialysis last night. Tolerated treatment well. UF of .   Complains of pain - left hip  Objective:  Vital signs in last 24 hours:  Temp:  [97.6 F (36.4 C)-98.5 F (36.9 C)] 98 F (36.7 C) (04/20 0411) Pulse Rate:  [59-75] 59 (04/20 1028) Resp:  [20] 20 (04/20 0411) BP: (91-132)/(44-62) 114/57 mmHg (04/20 1028) SpO2:  [93 %-95 %] 94 % (04/20 0411)  Weight change:  Filed Weights   07/17/15 0443 07/18/15 0448 07/19/15 0838  Weight: 123.106 kg (271 lb 6.4 oz) 127.053 kg (280 lb 1.6 oz) 127.053 kg (280 lb 1.6 oz)    Intake/Output:    Intake/Output Summary (Last 24 hours) at 07/20/15 1051 Last data filed at 07/20/15 0845  Gross per 24 hour  Intake    360 ml  Output    812 ml  Net   -452 ml     Physical Exam: General: NAD  HEENT Anicteric, moist oral mucous membranes  Neck supple  Pulm/lungs Normal effort, clear to auscultation bilaterally  CVS/Heart No rub or gallop  Abdomen:  Soft, nontender  Extremities: No peripheral edema, right BKA  Neurologic: Alert, oriented, speech normal  Skin: No acute rashes  Access: PD catheter, exit site nontender and clean       Basic Metabolic Panel:   Recent Labs Lab 07/15/15 1639 07/16/15 0803 07/17/15 0450 07/20/15 0547  NA 136  --  135  --   K 4.5  --  4.0  --   CL 100*  --  100*  --   CO2 20*  --  24  --   GLUCOSE 155*  --  145*  --   BUN 92*  --  77*  --   CREATININE 10.47* 9.44* 9.51*  --   CALCIUM 6.2*  --  6.4*  --   PHOS  --   --   --  11.7*     CBC:  Recent Labs Lab 07/15/15 1639 07/16/15 0803 07/17/15 0450  WBC 9.7 10.8 8.8  HGB 10.7* 9.4* 9.4*  HCT 31.8* 29.0* 28.2*  MCV 83.8 83.8 84.0  PLT 352 322 289      Microbiology:  Recent Results (from the past 720 hour(s))  Body fluid culture     Status: None   Collection Time: 07/15/15 10:23 PM  Result Value Ref Range Status   Specimen Description PERITONEAL  Final   Special Requests Normal  Final   Gram Stain  RARE WBC SEEN NO ORGANISMS SEEN   Final   Culture No growth aerobically or anaerobically.  Final   Report Status 07/19/2015 FINAL  Final  Culture, blood (routine x 2)     Status: None (Preliminary result)   Collection Time: 07/16/15  3:58 AM  Result Value Ref Range Status   Specimen Description BLOOD RIGHT WRIST  Final   Special Requests   Final    BOTTLES DRAWN AEROBIC AND ANAEROBIC 12CCAERO,6CCANA   Culture NO GROWTH 3 DAYS  Final   Report Status PENDING  Incomplete  Culture, blood (routine x 2)     Status: None (Preliminary result)   Collection Time: 07/16/15  3:58 AM  Result Value Ref Range Status   Specimen Description BLOOD RIGHT FOREARM  Final   Special Requests   Final    BOTTLES DRAWN AEROBIC AND ANAEROBIC 12CCAERO,2CCANA   Culture NO GROWTH 3 DAYS  Final   Report Status PENDING  Incomplete    Coagulation Studies: No results for input(s):  LABPROT, INR in the last 72 hours.  Urinalysis: No results for input(s): COLORURINE, LABSPEC, PHURINE, GLUCOSEU, HGBUR, BILIRUBINUR, KETONESUR, PROTEINUR, UROBILINOGEN, NITRITE, LEUKOCYTESUR in the last 72 hours.  Invalid input(s): APPERANCEUR    Imaging: Ct Head Wo Contrast  07/19/2015  CLINICAL DATA:  Left leg weakness. EXAM: CT HEAD WITHOUT CONTRAST TECHNIQUE: Contiguous axial images were obtained from the base of the skull through the vertex without intravenous contrast. COMPARISON:  05/23/2004 FINDINGS: Skull and Sinuses:Negative for fracture or destructive process. The visualized mastoids, middle ears, and imaged paranasal sinuses are clear. Visualized orbits: Interval bilateral cataract resection. Brain: No acute or remote infarction, hemorrhage, hydrocephalus, or mass lesion/mass effect. Normal cerebral volume. Intermittently poor visualization of gray-white differentiation is attributed to scanner/technique. IMPRESSION: No acute finding or explanation for leg weakness. Electronically Signed   By: Marnee Spring M.D.   On:  07/19/2015 16:55   Ct Lumbar Spine Wo Contrast  07/19/2015  CLINICAL DATA:  Squeezing pain in the left leg.  Left leg weakness. EXAM: CT LUMBAR SPINE WITHOUT CONTRAST TECHNIQUE: Multidetector CT imaging of the lumbar spine was performed without intravenous contrast administration. Multiplanar CT image reconstructions were also generated. COMPARISON:  06/03/2013, 10/26/2013, 07/15/2015 FINDINGS: The alignment is anatomic. The vertebral body heights are maintained. There is no acute fracture or static listhesis. The paravertebral soft tissues are normal. The intraspinal soft tissues are not fully imaged on this examination due to poor soft tissue contrast, but there is no gross soft tissue abnormality. There is degenerative disc disease at L3-4. There are Schmorl's nodes in the inferior endplate of L3 and superior endplate of L4. The endplate changes involving the L4 vertebral body have progressed compared with the prior exam. The remainder the disc spaces are relatively well preserved. At T12-L1 there is no significant disc protrusion, foraminal stenosis or central canal stenosis. At L1-2 there is no significant disc protrusion, foraminal stenosis or central canal stenosis. At L2-3 there is no significant disc protrusion, foraminal stenosis or central canal stenosis. At L3-4 there is a mild broad-based disc bulge. There is mild bilateral facet arthropathy. There is no significant foraminal or central canal stenosis. At L4-5 there is a broad-based disc bulge. There is severe right and mild left facet arthropathy. No significant foraminal or central canal stenosis. At L5-S1 there is moderate right and mild left facet arthropathy. No foraminal or central canal stenosis. There are degenerative changes of bilateral sacroiliac joints. IMPRESSION: 1. No acute injury of the lumbar spine. 2. Degenerative disc disease L3-4 with a mild broad-based disc bulge and mild bilateral facet arthropathy. 3. At L4-5 there is severe  right and mild left facet arthropathy. 4. At L5-S1 there is moderate right and mild left facet arthropathy. Electronically Signed   By: Elige Ko   On: 07/19/2015 17:03     Medications:     . amLODipine  10 mg Oral Daily  . aspirin EC  81 mg Oral Daily  . buPROPion  150 mg Oral Daily  . calcium acetate  2,001 mg Oral TID WC  . carvedilol  6.25 mg Oral BID WC  . clopidogrel  75 mg Oral Daily  . dialysis solution 2.5% low-MG/low-CA   Intraperitoneal Q24H  . dialysis solution 2.5% low-MG/low-CA   Intraperitoneal Q24H  . ezetimibe  10 mg Oral QHS  . gentamicin cream  1 application Topical Daily  . heparin  5,000 Units Subcutaneous 3 times per day  . insulin aspart  0-5 Units Subcutaneous QHS  . insulin  aspart  0-9 Units Subcutaneous TID WC  . isosorbide mononitrate  60 mg Oral Daily  . levofloxacin  500 mg Oral Q48H  . lisinopril  40 mg Oral Daily  . pantoprazole  40 mg Oral BID  . ranolazine  500 mg Oral BID  . sodium chloride flush  3 mL Intravenous Q12H   sodium chloride, acetaminophen **OR** acetaminophen, albuterol, docusate sodium, heparin, ondansetron **OR** ondansetron (ZOFRAN) IV, oxyCODONE-acetaminophen, polyethylene glycol, prochlorperazine, sodium chloride flush  Assessment/ Plan:  50 y.o. white  female with past medical history of end-stage renal disease on peritoneal dialysis, asthma, gastroparesis, diabetes mellitus, congestive heart failure, hypertension, endometriosis, coronary atherosclerosis, medical management  [facility]/[address]/peritoneal dialysis  1. End-stage renal disease on peritoneal dialysis. Home PD prescription of 3 exchanges 8 hours with 2.2 litre fills. 2.5% dextrose - nightly PD - if patient requires SNF, she will need to transition to hemodialysis and will permcath. Discuss cath with Dr. Wyn Quaker.   2. Acute peritonitis in PD patient. WBC cell count 690 with 73% neutrophils - PD fluid culture is no growth - Empiric treatment with  broad-spectrum antibiotics vancomycin and Levaquin. Vanco last given on 4/16.   3. Anemia chronic kidney disease.hemoglobin 9.4 - epo as outpatient.   4. Secondary hyperparathyroidism. phos elevated at 11.3 on 4/11. Not currently on a binder. Takes calcium acetate as outpatient. Restarted today     LOS: 4 Kimberly Montgomery 4/20/201710:51 AM

## 2015-07-20 NOTE — Progress Notes (Signed)
Patient is having pain in L leg. [name] [name] called, daughter stated, "my mother was supposed to be discharged on yesterday 07/19/15, the nurse allowed my mother to fall. Now my mother has been having severe pain in her left leg." [name] called back upset that the patient agreed to continue with HD per MD. I notified MD of patient/family concerns Patient stated, "I want to hold off on making any decisions till my daughter is here with me. I am feeling confused." Dr. Allena Katz has been made aware of all patient's concerns.

## 2015-07-20 NOTE — Progress Notes (Signed)
PD tx ended

## 2015-07-20 NOTE — Progress Notes (Signed)
Patient ID: ONISHA MENDOSA, female   DOB: November 26, 1965, 50 y.o.   MRN: [id number] Lengthy discussion made with pt, daughter [name], dr Wynelle Link and [facility] internal medicine and [facility] nephrology Despite the fact pt feels unsafe to go home she and dter does not want to consider hemodialysis. Rehab places do not accept Pt's on PD. Pt admant no not converting although initially she did and changed her mind after d/w her daughter. Pt on wait list for [facility] transfer. She is medically ready for discharge to rehab but the barrier is pt not wanting to consider temporary HD. Pt and dter informed and aware not many options and insurance may not consider additional hospital stay.   Dr Ned Clines (primary nephrologist) also aware of pt's situation and is also recommending short term HD for rehab purposes. Very difficult DISCHARGE DISPOSITION!!!

## 2015-07-20 NOTE — Care Management Important Message (Signed)
Important Message  Patient Details  Name: Kimberly Montgomery MRN: [id number] Date of Birth: 1965-04-13   Medicare Important Message Given:  Yes    [name] A Alayha Babineaux 07/20/2015, 11:04 AM

## 2015-07-20 NOTE — Plan of Care (Signed)
Problem: Tissue Perfusion: Goal: Risk factors for ineffective tissue perfusion will decrease Outcome: Progressing Heparin subq

## 2015-07-20 NOTE — Progress Notes (Signed)
PT Cancellation Note  Patient Details Name: EMI VELASQUES MRN: [id number] DOB: [birth year]   Cancelled Treatment:    Reason Eval/Treat Not Completed: Other (comment) Attempted to see pt for PT. Pt still connected to dialysis, unable to ambulate. Will resume treatment once dialysis removed. Pt c/o pain in L LE.    Dorita Fray 07/20/2015, 8:39 AM M. Hettie Holstein, SPT

## 2015-07-20 NOTE — Progress Notes (Signed)
Received word from [facility] That patient has been accepted in transfer to dr. detweiler's service.

## 2015-07-20 NOTE — Discharge Summary (Signed)
[facility] [place] [facility]   PATIENT NAME: Kimberly Montgomery    MR#:  [id number]  DATE OF BIRTH:  03-05-1966  DATE OF ADMISSION:  07/15/2015 ADMITTING PHYSICIAN: Ihor Austin, MD  DATE OF DISCHARGE: 07/20/15  PRIMARY CARE PHYSICIAN: Jarome Matin, MD    ADMISSION DIAGNOSIS:  SBP (spontaneous bacterial peritonitis) (HCC) [K65.2] Vomiting and diarrhea [R11.10, R19.7] ESRD on peritoneal dialysis (HCC) [N18.6, Z99.2] Type 2 diabetes mellitus with chronic kidney disease on chronic dialysis, unspecified long term insulin use status (HCC) [E11.22, N18.6]  DISCHARGE DIAGNOSIS:  Spontaneous bacterial peritonitis End-stage renal disease on peritoneal dialysis 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 Renal disorder   . Hypertension   . CHF (congestive heart failure) (HCC)   . Diabetes mellitus without complication (HCC)   . Gastroparesis   . Asthma   . ESRD (end stage renal disease) (HCC)   . Endometriosis   . CAD (coronary artery disease)   . HLD (hyperlipidemia)   . GERD (gastroesophageal reflux disease)     HOSPITAL COURSE:   50 year old female patient with history of end-stage renal disease on peritoneal dialysis, hypertension, coronary artery disease, congestive heart failure presented to the emergency room with nausea and vomiting and abdominal discomfort.  # Acute peritonitis with nausea vomiting and diffuse abdominal pain. Peritoneal fluid with neutrophils greater than 70% Continue  Levofloxacin 500 mg QOD (total 10 days from 07/15/15) No need for vanc per nephrology. -repeat PD fluid clearing. No fever. PD fluid cx neg Provided symptomatic treatment with anti-emetics and pain medications as needed  #. End-stage renal disease on peritoneal dialysis Continue peritoneal dialysis as recommended by nephrology. discussed  with Dr. Wynelle Link  # Gastroparesis  PPI BID  Tolerating po diet well. Erythromycin discontinued  #. Mildly elevated troponin which could be secondary to renal failure Initial troponin 0.05. Patient denies any chest pain.  #. Hypertension Continue Coreg and Norvasc as tolerated  # Congestive heart failure, chronic, diastolic - continue home medication aspirin 81 mg, Plavix 75 mg, Coreg and amlodipine. - Continue statin   DVT prophylaxis subcutaneous heparin.  # Myoclonic jerks suspected due to gabapentin which was discontinued by Dr. Emmaline Life from neurology -When necessary low-dose Ativan if needed. Patient's symptoms have improved. CT head negative  #Bilateral lower back pain and left lower extremity pain CT lumbar spine done by neurology shows aggression of lumbar disc degenerative arthritis. When necessary pain meds Patient seen by physical therapy recommends rehabilitation. Patient feels unsafe to go home.  If patient needs to go to rehabilitation she needs to be converted to hemodialysis. This was discussed with patient earlier who agreed on and how were after discussing with her daughter patient declined hemodialysis. Dr. Wynelle Link with patient's [facility] nephrologist who suggested patient be transferred to [facility] for further care and management. Patient is agreeable with this. We are awaiting for availability for a bed at [facility].   CONSULTS OBTAINED:  Treatment Team:  Mosetta Pigeon, MD Kym Groom, MD Annice Needy, MD  DRUG ALLERGIES:   Allergies  Allergen Reactions  . Cephalosporins Anaphylaxis  . Penicillins Anaphylaxis and Other (See Comments)    Has patient had a PCN reaction causing immediate rash, facial/tongue/throat swelling, SOB or lightheadedness with hypotension: Yes Has patient had a PCN reaction causing severe rash involving mucus membranes or skin necrosis: No Has patient had a PCN reaction  that required hospitalization No Has patient had a PCN reaction  occurring within the last 10 years: No If all of the above answers are "NO", then may proceed with Cephalosporin use.  [name] Phenergan [Promethazine Hcl] Nausea And Vomiting    DISCHARGE MEDICATIONS:   Current Discharge Medication List    START taking these medications   Details  erythromycin (ERY-TAB) 250 MG EC tablet Take 1 tablet (250 mg total) by mouth 4 (four) times daily. Qty: 120 tablet, Refills: 1    levofloxacin (LEVAQUIN) 500 MG tablet Take 1 tablet (500 mg total) by mouth every other day. Qty: 5 tablet, Refills: 0    oxyCODONE-acetaminophen (PERCOCET/ROXICET) 5-325 MG tablet Take 1-2 tablets by mouth every 6 (six) hours as needed for moderate pain. Qty: 30 tablet, Refills: 0      CONTINUE these medications which have CHANGED   Details  ondansetron (ZOFRAN ODT) 4 MG disintegrating tablet Take 1 tablet (4 mg total) by mouth every 6 (six) hours as needed for nausea or vomiting. Qty: 20 tablet, Refills: 0      CONTINUE these medications which have NOT CHANGED   Details  acetaminophen (TYLENOL) 500 MG tablet Take 1,000 mg by mouth every 6 (six) hours as needed for mild pain.     albuterol (PROVENTIL HFA;VENTOLIN HFA) 108 (90 Base) MCG/ACT inhaler Inhale 2 puffs into the lungs every 6 (six) hours as needed for wheezing or shortness of breath.    amLODipine (NORVASC) 10 MG tablet Take 10 mg by mouth daily.    aspirin EC 81 MG tablet Take 81 mg by mouth daily.    aspirin-acetaminophen-caffeine (EXCEDRIN MIGRAINE) 250-250-65 MG tablet Take 2 tablets by mouth every 6 (six) hours as needed for headache.    buPROPion (WELLBUTRIN SR) 150 MG 12 hr tablet Take 150 mg by mouth daily.    calcium acetate, Phos Binder, (PHOSLYRA) 667 MG/5ML SOLN Take 2,668 mg by mouth 3 (three) times daily with meals.     carvedilol (COREG) 6.25 MG tablet Take 6.25 mg by mouth 2 (two) times daily with a meal.    clopidogrel (PLAVIX) 75 MG tablet Take 75 mg by mouth daily.    docusate sodium  (COLACE) 100 MG capsule Take 100 mg by mouth at bedtime as needed for mild constipation.    ezetimibe-simvastatin (VYTORIN) 10-40 MG tablet Take 1 tablet by mouth at bedtime.    gabapentin (NEURONTIN) 600 MG tablet Take 600 mg by mouth 3 (three) times daily. Refills: 5    insulin aspart (NOVOLOG) 100 UNIT/ML injection Inject 15 Units into the skin 3 (three) times daily with meals.    insulin glargine (LANTUS) 100 UNIT/ML injection Inject 65 Units into the skin at bedtime.    isosorbide mononitrate (IMDUR) 60 MG 24 hr tablet Take 1 tablet (60 mg total) by mouth daily. Qty: 30 tablet, Refills: 00    lisinopril (PRINIVIL,ZESTRIL) 40 MG tablet Take 40 mg by mouth daily. Refills: 3    nitroGLYCERIN (NITROSTAT) 0.4 MG SL tablet Place 0.4 mg under the tongue every 5 (five) minutes as needed for chest pain. Reported on 07/10/2015    omeprazole (PRILOSEC) 40 MG capsule Take 40 mg by mouth 2 (two) times daily.    polyethylene glycol (MIRALAX / GLYCOLAX) packet Take 17 g by mouth daily as needed for mild constipation.    ranolazine (RANEXA) 500 MG 12 hr tablet Take 1 tablet (500 mg total) by mouth 2 (two) times daily. Qty: 60 tablet, Refills: 0  torsemide (DEMADEX) 100 MG tablet Take 100 mg by mouth daily.        If you experience worsening of your admission symptoms, develop shortness of breath, life threatening emergency, suicidal or homicidal thoughts you must seek medical attention immediately by calling 911 or calling your MD immediately  if symptoms less severe.  You Must read complete instructions/literature along with all the possible adverse reactions/side effects for all the Medicines you take and that have been prescribed to you. Take any new Medicines after you have completely understood and accept all the possible adverse reactions/side effects.   Please note  You were cared for by a hospitalist during your hospital stay. If you have any questions about your discharge  medications or the care you received while you were in the hospital after you are discharged, you can call the unit and asked to speak with the hospitalist on call if the hospitalist that took care of you is not available. Once you are discharged, your primary care physician will handle any further medical issues. Please note that NO REFILLS for any discharge medications will be authorized once you are discharged, as it is imperative that you return to your primary care physician (or establish a relationship with a primary care physician if you do not have one) for your aftercare needs so that they can reassess your need for medications and monitor your lab values. Today   SUBJECTIVE   Patient worked with physical therapy she is out of the recliner. Complaints of back pain.  VITAL SIGNS:  Blood pressure 121/62, pulse 59, temperature 98.1 F (36.7 C), temperature source Oral, resp. rate 18, height 5' 9.02" (1.753 m), weight 127.053 kg (280 lb 1.6 oz), last menstrual period 03/16/2015, SpO2 94 %.  I/O:   Intake/Output Summary (Last 24 hours) at 07/20/15 1300 Last data filed at 07/20/15 0845  Gross per 24 hour  Intake    240 ml  Output    812 ml  Net   -572 ml    PHYSICAL EXAMINATION:  GENERAL:  50 y.o.-year-old patient lying in the bed with no acute distress. Appears chronically ill EYES: Pupils equal, round, reactive to light and accommodation. No scleral icterus. Extraocular muscles intact.  HEENT: Head atraumatic, normocephalic. Oropharynx and nasopharynx clear.  NECK:  Supple, no jugular venous distention. No thyroid enlargement, no tenderness.  LUNGS: Normal breath sounds bilaterally, no wheezing, rales,rhonchi or crepitation. No use of accessory muscles of respiration.  CARDIOVASCULAR: S1, S2 normal. No murmurs, rubs, or gallops.  ABDOMEN: Soft, non-tender, non-distended. Bowel sounds present. No organomegaly or mass.  EXTREMITIES: No pedal edema, cyanosis, or clubbing. Right  prosthetic limb NEUROLOGIC: Cranial nerves II through XII are intact. Muscle strength 5/5 in all extremities. Sensation intact. Gait not checked.  PSYCHIATRIC: The patient is alert and oriented x 3.  SKIN: No obvious rash, lesion, or ulcer.   DATA REVIEW:   CBC   Recent Labs Lab 07/17/15 0450  WBC 8.8  HGB 9.4*  HCT 28.2*  PLT 289    Chemistries   Recent Labs Lab 07/15/15 1639  07/17/15 0450  NA 136  --  135  K 4.5  --  4.0  CL 100*  --  100*  CO2 20*  --  24  GLUCOSE 155*  --  145*  BUN 92*  --  77*  CREATININE 10.47*  < > 9.51*  CALCIUM 6.2*  --  6.4*  AST 16  --   --   ALT 13*  --   --  ALKPHOS 102  --   --   BILITOT 0.6  --   --   < > = values in this interval not displayed.  Microbiology Results   Recent Results (from the past 240 hour(s))  Body fluid culture     Status: None   Collection Time: 07/15/15 10:23 PM  Result Value Ref Range Status   Specimen Description PERITONEAL  Final   Special Requests Normal  Final   Gram Stain RARE WBC SEEN NO ORGANISMS SEEN   Final   Culture No growth aerobically or anaerobically.  Final   Report Status 07/19/2015 FINAL  Final  Culture, blood (routine x 2)     Status: None (Preliminary result)   Collection Time: 07/16/15  3:58 AM  Result Value Ref Range Status   Specimen Description BLOOD RIGHT WRIST  Final   Special Requests   Final    BOTTLES DRAWN AEROBIC AND ANAEROBIC 12CCAERO,6CCANA   Culture NO GROWTH 4 DAYS  Final   Report Status PENDING  Incomplete  Culture, blood (routine x 2)     Status: None (Preliminary result)   Collection Time: 07/16/15  3:58 AM  Result Value Ref Range Status   Specimen Description BLOOD RIGHT FOREARM  Final   Special Requests   Final    BOTTLES DRAWN AEROBIC AND ANAEROBIC 12CCAERO,2CCANA   Culture NO GROWTH 4 DAYS  Final   Report Status PENDING  Incomplete    RADIOLOGY:  Ct Head Wo Contrast  07/19/2015  CLINICAL DATA:  Left leg weakness. EXAM: CT HEAD WITHOUT CONTRAST  TECHNIQUE: Contiguous axial images were obtained from the base of the skull through the vertex without intravenous contrast. COMPARISON:  05/23/2004 FINDINGS: Skull and Sinuses:Negative for fracture or destructive process. The visualized mastoids, middle ears, and imaged paranasal sinuses are clear. Visualized orbits: Interval bilateral cataract resection. Brain: No acute or remote infarction, hemorrhage, hydrocephalus, or mass lesion/mass effect. Normal cerebral volume. Intermittently poor visualization of gray-white differentiation is attributed to scanner/technique. IMPRESSION: No acute finding or explanation for leg weakness. Electronically Signed   By: Marnee Spring M.D.   On: 07/19/2015 16:55   Ct Lumbar Spine Wo Contrast  07/19/2015  CLINICAL DATA:  Squeezing pain in the left leg.  Left leg weakness. EXAM: CT LUMBAR SPINE WITHOUT CONTRAST TECHNIQUE: Multidetector CT imaging of the lumbar spine was performed without intravenous contrast administration. Multiplanar CT image reconstructions were also generated. COMPARISON:  06/03/2013, 10/26/2013, 07/15/2015 FINDINGS: The alignment is anatomic. The vertebral body heights are maintained. There is no acute fracture or static listhesis. The paravertebral soft tissues are normal. The intraspinal soft tissues are not fully imaged on this examination due to poor soft tissue contrast, but there is no gross soft tissue abnormality. There is degenerative disc disease at L3-4. There are Schmorl's nodes in the inferior endplate of L3 and superior endplate of L4. The endplate changes involving the L4 vertebral body have progressed compared with the prior exam. The remainder the disc spaces are relatively well preserved. At T12-L1 there is no significant disc protrusion, foraminal stenosis or central canal stenosis. At L1-2 there is no significant disc protrusion, foraminal stenosis or central canal stenosis. At L2-3 there is no significant disc protrusion, foraminal  stenosis or central canal stenosis. At L3-4 there is a mild broad-based disc bulge. There is mild bilateral facet arthropathy. There is no significant foraminal or central canal stenosis. At L4-5 there is a broad-based disc bulge. There is severe right and mild left facet arthropathy. No  significant foraminal or central canal stenosis. At L5-S1 there is moderate right and mild left facet arthropathy. No foraminal or central canal stenosis. There are degenerative changes of bilateral sacroiliac joints. IMPRESSION: 1. No acute injury of the lumbar spine. 2. Degenerative disc disease L3-4 with a mild broad-based disc bulge and mild bilateral facet arthropathy. 3. At L4-5 there is severe right and mild left facet arthropathy. 4. At L5-S1 there is moderate right and mild left facet arthropathy. Electronically Signed   By: Elige Ko   On: 07/19/2015 17:03     Management plans discussed with the patient, family and they are in agreement.  CODE STATUS:     Code Status Orders        Start     Ordered   07/16/15 0626  Full code   Continuous     07/16/15 0625    Code Status History    Date Active Date Inactive Code Status Order ID Comments User Context   07/10/2015  1:49 AM 07/12/2015  1:01 PM Full Code [id number]  Oralia Manis, MD Inpatient   05/12/2015  3:46 PM 05/16/2015  5:35 PM Full Code [id number]  Auburn Bilberry, MD ED      TOTAL TIME TAKING CARE OF THIS PATIENT: 40 minutes.    Jeremyah Jelley M.D on 07/20/2015 at 1:00 PM  Between 7am to 6pm - Pager - [phone] After 6pm go to [email or web address] - password [email or web address] [facility]  [place] [place] [facility]  Office  [phone]  CC: Primary care physician; Jarome Matin, MD

## 2015-07-21 ENCOUNTER — Inpatient Hospital Stay: Payer: Medicare HMO

## 2015-07-21 ENCOUNTER — Encounter
Admission: EM | Disposition: A | Discharge status: Home Health | Payer: Self-pay | Source: Home / Self Care | Attending: Internal Medicine

## 2015-07-21 LAB — CULTURE, BLOOD (ROUTINE X 2)
CULTURE: NO GROWTH
CULTURE: NO GROWTH

## 2015-07-21 LAB — CBC WITH DIFFERENTIAL/PLATELET
BASOS ABS: 0.2 10*3/uL — AB (ref 0–0.1)
Basophils Relative: 1 %
Eosinophils Absolute: 0.3 10*3/uL (ref 0–0.7)
Eosinophils Relative: 2 %
HEMATOCRIT: 24.6 % — AB (ref 35.0–47.0)
HEMOGLOBIN: 8.1 g/dL — AB (ref 12.0–16.0)
LYMPHS PCT: 8 %
Lymphs Abs: 1.1 10*3/uL (ref 1.0–3.6)
MCH: 27.3 pg (ref 26.0–34.0)
MCHC: 32.9 g/dL (ref 32.0–36.0)
MCV: 83 fL (ref 80.0–100.0)
Monocytes Absolute: 1.1 10*3/uL — ABNORMAL HIGH (ref 0.2–0.9)
Monocytes Relative: 7 %
NEUTROS ABS: 11.9 10*3/uL — AB (ref 1.4–6.5)
NEUTROS PCT: 82 %
Platelets: 302 10*3/uL (ref 150–440)
RBC: 2.97 MIL/uL — AB (ref 3.80–5.20)
RDW: 14 % (ref 11.5–14.5)
WBC: 14.5 10*3/uL — AB (ref 3.6–11.0)

## 2015-07-21 LAB — BASIC METABOLIC PANEL WITH GFR
Anion gap: 15 (ref 5–15)
BUN: 62 mg/dL — ABNORMAL HIGH (ref 6–20)
CO2: 22 mmol/L (ref 22–32)
Calcium: 5.9 mg/dL — CL (ref 8.9–10.3)
Chloride: 91 mmol/L — ABNORMAL LOW (ref 101–111)
Creatinine, Ser: 9.89 mg/dL — ABNORMAL HIGH (ref 0.44–1.00)
GFR calc Af Amer: 5 mL/min — ABNORMAL LOW
GFR calc non Af Amer: 4 mL/min — ABNORMAL LOW
Glucose, Bld: 157 mg/dL — ABNORMAL HIGH (ref 65–99)
Potassium: 4.2 mmol/L (ref 3.5–5.1)
Sodium: 128 mmol/L — ABNORMAL LOW (ref 135–145)

## 2015-07-21 LAB — TROPONIN I: Troponin I: <0.03 ng/mL (ref <0.031)

## 2015-07-21 LAB — BASIC METABOLIC PANEL
ANION GAP: 17 — AB (ref 5–15)
BUN: 66 mg/dL — ABNORMAL HIGH (ref 6–20)
CO2: 20 mmol/L — AB (ref 22–32)
Calcium: 6 mg/dL — CL (ref 8.9–10.3)
Chloride: 89 mmol/L — ABNORMAL LOW (ref 101–111)
Creatinine, Ser: 10.62 mg/dL — ABNORMAL HIGH (ref 0.44–1.00)
GFR calc Af Amer: 4 mL/min — ABNORMAL LOW (ref >60)
GFR, EST NON AFRICAN AMERICAN: 4 mL/min — AB (ref >60)
GLUCOSE: 106 mg/dL — AB (ref 65–99)
POTASSIUM: 4.8 mmol/L (ref 3.5–5.1)
Sodium: 126 mmol/L — ABNORMAL LOW (ref 135–145)

## 2015-07-21 LAB — BLOOD GAS, ARTERIAL
Acid-base deficit: 6.4 mmol/L — ABNORMAL HIGH (ref 0.0–2.0)
Bicarbonate: 19.1 mEq/L — ABNORMAL LOW (ref 21.0–28.0)
FIO2: 1
O2 Saturation: 99.7 %
PATIENT TEMPERATURE: 37
pCO2 arterial: 37 mmHg (ref 32.0–48.0)
pH, Arterial: 7.32 — ABNORMAL LOW (ref 7.350–7.450)
pO2, Arterial: 204 mmHg — ABNORMAL HIGH (ref 83.0–108.0)

## 2015-07-21 LAB — CBC
HEMATOCRIT: 24.5 % — AB (ref 35.0–47.0)
Hemoglobin: 8.2 g/dL — ABNORMAL LOW (ref 12.0–16.0)
MCH: 28.1 pg (ref 26.0–34.0)
MCHC: 33.5 g/dL (ref 32.0–36.0)
MCV: 84.1 fL (ref 80.0–100.0)
Platelets: 250 10*3/uL (ref 150–440)
RBC: 2.91 MIL/uL — ABNORMAL LOW (ref 3.80–5.20)
RDW: 14.2 % (ref 11.5–14.5)
WBC: 9.5 10*3/uL (ref 3.6–11.0)

## 2015-07-21 LAB — GLUCOSE, CAPILLARY
Glucose-Capillary: 130 mg/dL — ABNORMAL HIGH (ref 65–99)
Glucose-Capillary: 132 mg/dL — ABNORMAL HIGH (ref 65–99)
Glucose-Capillary: 185 mg/dL — ABNORMAL HIGH (ref 65–99)
Glucose-Capillary: 117 mg/dL — ABNORMAL HIGH (ref 65–99)
Glucose-Capillary: 104 mg/dL — ABNORMAL HIGH (ref 65–99)

## 2015-07-21 LAB — MAGNESIUM: Magnesium: 1.9 mg/dL (ref 1.7–2.4)

## 2015-07-21 LAB — VANCOMYCIN, RANDOM: Vancomycin Rm: 16 ug/mL

## 2015-07-21 SURGERY — DIALYSIS/PERMA CATHETER INSERTION
Anesthesia: Moderate Sedation

## 2015-07-21 MED ORDER — CALCIUM CARBONATE ANTACID 500 MG PO CHEW
1.0000 | CHEWABLE_TABLET | Freq: Three times a day (TID) | ORAL | Status: DC
  Administered 2015-07-21 – 2015-08-02 (×25): 200 mg via ORAL
  Filled 2015-07-21 (×25): qty 1

## 2015-07-21 MED ORDER — LORAZEPAM 2 MG/ML IJ SOLN: 0.5000 mg | Freq: Once | INTRAMUSCULAR | Status: DC

## 2015-07-21 MED ORDER — ONDANSETRON HCL 4 MG/2ML IJ SOLN
2.0000 mg | Freq: Four times a day (QID) | INTRAMUSCULAR | Status: DC | PRN
  Administered 2015-07-22 – 2015-07-27 (×6): 2 mg via INTRAVENOUS
  Filled 2015-07-21 (×6): qty 2

## 2015-07-21 MED ORDER — DIVALPROEX SODIUM 250 MG PO DR TAB
500.0000 mg | DELAYED_RELEASE_TABLET | Freq: Two times a day (BID) | ORAL | Status: DC
  Administered 2015-07-22 – 2015-07-23 (×3): 500 mg via ORAL
  Filled 2015-07-21 (×3): qty 2

## 2015-07-21 MED ORDER — DEXTROSE 5 % IV SOLN
500.0000 mg | Freq: Four times a day (QID) | INTRAVENOUS | Status: DC
  Filled 2015-07-21 (×2): qty 0.5

## 2015-07-21 MED ORDER — ONDANSETRON HCL 4 MG PO TABS
2.0000 mg | ORAL_TABLET | Freq: Four times a day (QID) | ORAL | Status: DC | PRN
  Administered 2015-07-23: 2 mg via ORAL
  Filled 2015-07-21: qty 1

## 2015-07-21 MED ORDER — LEVOFLOXACIN IN D5W 500 MG/100ML IV SOLN
500.0000 mg | Freq: Once | INTRAVENOUS | Status: DC
  Filled 2015-07-21: qty 100

## 2015-07-21 MED ORDER — DEXTROSE 5 % IV SOLN
2.0000 g | Freq: Once | INTRAVENOUS | Status: AC
  Administered 2015-07-21: 2 g via INTRAVENOUS
  Filled 2015-07-21: qty 2

## 2015-07-21 MED ORDER — LORAZEPAM 2 MG/ML IJ SOLN: 1.0000 mg | INTRAMUSCULAR | Status: DC | PRN

## 2015-07-21 MED ORDER — VALPROATE SODIUM 500 MG/5ML IV SOLN
1.0000 g | Freq: Once | INTRAVENOUS | Status: AC
  Administered 2015-07-21: 1000 mg via INTRAVENOUS
  Filled 2015-07-21: qty 10

## 2015-07-21 NOTE — Progress Notes (Signed)
Pharmacy Antibiotic Note  Kimberly Montgomery is a 50 y.o. female admitted on 07/15/2015 with IAI.  Pharmacy has been consulted for levofloxacin dosing for patient on peritoneal dialysis.   Plan: Continue Levofloxacin 500mg  po q 48 hours.  Noted on order to separate from TUMS (calcium carbonate).  Height: 5' 9.02" (175.3 cm) Weight: 129 lb 4.8 oz (58.65 kg) IBW/kg (Calculated) : 66.24  Temp (24hrs), Avg:97.9 F (36.6 C), Min:97.5 F (36.4 C), Max:98.3 F (36.8 C)  4/16: BloodCx NGTD 4/15 Peritoneal Fluid Cx NGTD  Recent Labs Lab 07/15/15 1639 07/16/15 0803 07/17/15 0450 07/19/15 0431 07/19/15 1233 07/21/15 0427  WBC 9.7 10.8 8.8  --   --  9.5  CREATININE 10.47* 9.44* 9.51*  --   --  9.89*  VANCOTROUGH  --   --   --  22*  --   --   VANCORANDOM  --   --   --   --  19  --     Estimated Creatinine Clearance: 6.2 mL/min (by C-G formula based on Cr of 9.89).    Allergies  Allergen Reactions  . Cephalosporins Anaphylaxis  . Penicillins Anaphylaxis and Other (See Comments)    Has patient had a PCN reaction causing immediate rash, facial/tongue/throat swelling, SOB or lightheadedness with hypotension: Yes Has patient had a PCN reaction causing severe rash involving mucus membranes or skin necrosis: No Has patient had a PCN reaction that required hospitalization No Has patient had a PCN reaction occurring within the last 10 years: No If all of the above answers are "NO", then may proceed with Cephalosporin use.  [name] Phenergan [Promethazine Hcl] Nausea And Vomiting    Thank you for allowing pharmacy to be a part of this patient's care.  Tonnia Bardin A, Pharm.D. Clinical Pharmacist 07/21/2015 3:30 PM

## 2015-07-21 NOTE — Significant Event (Signed)
Rapid Response Event Note  Overview:    RRT paged for this patient at 3:00pm.  Suspected tonic clonic seizure witnessed by primary RN, patient now unresponsive, although VSS on room air.  Pupils equal and reactive, but sluggish.  Dr Elpidio Anis at bedside.  He ordered MRI.  After five minutes of what appears to be post-ictal state, patient was reponsive to sternal rub.  Dr Thad Ranger, neurology was paged and patient started on anti seizure medication.  Initial Focused Assessment:   Interventions:   Event Summary:   at      at          [facility]

## 2015-07-21 NOTE — Progress Notes (Signed)
Per Dr. Allena Katz, patient has refused perm cath placement. Orders to resume previous diet and discontinue telemetry monitoring.

## 2015-07-21 NOTE — Progress Notes (Signed)
Dr Sheryle Hail made aware of calcium level. No new orders at this time

## 2015-07-21 NOTE — Progress Notes (Addendum)
Patient returned from EEG. Nurse went to check on her, she stated she was feeling a little anxious/restless and wanted her PRN xanax. Patient was able to tell me her name and date of birth. I handed her the pill and her drink and she almost immediately started tonic/clonic-seizure like activity - neither pill nor drink made it in her mouth. This only lasted for about 5-10 seconds, with incontinent void. RN called for assistance and rapid response. After the movements stopped, patient was unresponsive. Vitals WDL, CBG 180's, breathing and palpable pulses. Dr. Elpidio Anis at bedside. Dr. Allena Katz notified - she will call Dr. Thad Ranger. Orders from Dr. Allena Katz to give IV push ativan, but seizure did not last long, so Dr. Elpidio Anis said to hold off right now. Dr. Allena Katz on the way.

## 2015-07-21 NOTE — Progress Notes (Addendum)
Responded to call for CODE BLUE. On arrival patient was stable from both a cardiac and respiratory standpoint. She was responsive to verbal stimuli in that she will track you with her eyes when you call her name, however she is noncommunicative and is not following commands. On chart review is noted that the patient had a tonic-clonic seizure earlier today and was started on antiepileptics for the same. There is no report of seizure activity for this occurrence, and given that the patient is responding to verbal stimuli she does not appear to be postictal at this time. She has a peritoneal dialysis patient who is here with peritonitis. She is on antibiotics. She was supposed to have done peritoneal dialysis tonight, though this was never started. ABG was drawn, stat BMP, CBC and troponin were sent. Suspect this is some affect either from her infection or electrolyte abnormalities due to not receiving dialysis. We will follow up with above labs. Patient will be transferred to stepdown unit, nephrology is also on board for dialysis.  Update:  WBC increased from 9 to 14 with bandemia, pt was on levaquin, will change to Vanc and zosyn.    Kristeen Miss [facility] 07/21/2015, 10:09 PM

## 2015-07-21 NOTE — Progress Notes (Signed)
Called to see patient while i was on the floor.  Patient was being given a pill by her nurse and then started having generalized tonic-clonic seizures for a few seconds and was unresponsive Patient unresponsive on arrival. Tachypneic. Pulse present. No response to sternal rub.  Pupils bilaterally equal and sluggishly responsive  Glucose checked immediately was 185. Saturations 94% and blood pressure 137/85.  After a few minutes patient woke up with sternal rub and disoriented.  * Generalized tonic-clonic seizure CT scan head 2 days prior was normal. Patient had EEG earlier today. Presently postictal and confused. Discussed with primary physician Dr. Allena Katz who will discussed with Dr. Thad Ranger of neurology regarding antiseizure medications and MR of the brain. Ativan when necessary added for any further seizures. Seizure precautions ordered.

## 2015-07-21 NOTE — Progress Notes (Addendum)
Patient ID: VALOREE AGENT, female   DOB: 03-25-1966, 50 y.o.   MRN: [id number] [facility] [place] at [facility]   PATIENT NAME: Kimberly Montgomery    MR#:  [id number]  DATE OF BIRTH:  03/16/1966  SUBJECTIVE:   Weakness, jerky spells intermittent upper body. Patient still refusing to convert to hemodialysis  REVIEW OF SYSTEMS:   Review of Systems  Constitutional: Negative for fever, chills and weight loss.  HENT: Negative for ear discharge, ear pain and nosebleeds.   Eyes: Negative for blurred vision, pain and discharge.  Respiratory: Negative for sputum production, shortness of breath, wheezing and stridor.   Cardiovascular: Negative for chest pain, palpitations, orthopnea and PND.  Gastrointestinal: Negative for nausea, vomiting, abdominal pain and diarrhea.  Genitourinary: Negative for urgency and frequency.  Musculoskeletal: Negative for back pain and joint pain.  Neurological: Positive for dizziness, tremors and weakness. Negative for sensory change, speech change and focal weakness.  Psychiatric/Behavioral: Negative for depression and hallucinations. The patient is not nervous/anxious.   All other systems reviewed and are negative.  Tolerating Diet:yes Tolerating PT: Short-term rehabilitation DRUG ALLERGIES:   Allergies  Allergen Reactions  . Cephalosporins Anaphylaxis  . Penicillins Anaphylaxis and Other (See Comments)    Has patient had a PCN reaction causing immediate rash, facial/tongue/throat swelling, SOB or lightheadedness with hypotension: Yes Has patient had a PCN reaction causing severe rash involving mucus membranes or skin necrosis: No Has patient had a PCN reaction that required hospitalization No Has patient had a PCN reaction occurring within the last 10 years: No If all of the above answers are "NO", then may proceed with Cephalosporin use.  [name] Phenergan [Promethazine Hcl] Nausea And Vomiting    VITALS:  Blood pressure 114/89, pulse  111, temperature 97.5 F (36.4 C), temperature source Oral, resp. rate 18, height 5' 9.02" (1.753 m), weight 58.65 kg (129 lb 4.8 oz), last menstrual period 03/16/2015, SpO2 92 %.  PHYSICAL EXAMINATION:   Physical Exam  GENERAL:  50 y.o.-year-old patient lying in the bed with no acute distress.  EYES: Pupils equal, round, reactive to light and accommodation. No scleral icterus. Extraocular muscles intact.  HEENT: Head atraumatic, normocephalic. Oropharynx and nasopharynx clear.  NECK:  Supple, no jugular venous distention. No thyroid enlargement, no tenderness.  LUNGS: Normal breath sounds bilaterally, no wheezing, rales, rhonchi. No use of accessory muscles of respiration.  CARDIOVASCULAR: S1, S2 normal. No murmurs, rubs, or gallops.  ABDOMEN: Soft, nontender, nondistended. Bowel sounds present. No organomegaly or mass.  EXTREMITIES: No cyanosis, clubbing or edema b/l.   Right prosthetic leg NEUROLOGIC: Cranial nerves II through XII are intact. No focal Motor or sensory deficits b/l.  Intermittent myoclonic jerks of her body PSYCHIATRIC:  patient is alert and oriented x 3.  SKIN: No obvious rash, lesion, or ulcer.   LABORATORY PANEL:  CBC  Recent Labs Lab 07/21/15 0427  WBC 9.5  HGB 8.2*  HCT 24.5*  PLT 250    Chemistries   Recent Labs Lab 07/15/15 1639  07/21/15 0427  NA 136  < > 128*  K 4.5  < > 4.2  CL 100*  < > 91*  CO2 20*  < > 22  GLUCOSE 155*  < > 157*  BUN 92*  < > 62*  CREATININE 10.47*  < > 9.89*  CALCIUM 6.2*  < > 5.9*  MG  --   --  1.9  AST 16  --   --   ALT 13*  --   --  ALKPHOS 102  --   --   BILITOT 0.6  --   --   < > = values in this interval not displayed. Cardiac Enzymes  Recent Labs Lab 07/15/15 2111  TROPONINI 0.05*   RADIOLOGY:  Ct Head Wo Contrast  07/19/2015  CLINICAL DATA:  Left leg weakness. EXAM: CT HEAD WITHOUT CONTRAST TECHNIQUE: Contiguous axial images were obtained from the base of the skull through the vertex without  intravenous contrast. COMPARISON:  05/23/2004 FINDINGS: Skull and Sinuses:Negative for fracture or destructive process. The visualized mastoids, middle ears, and imaged paranasal sinuses are clear. Visualized orbits: Interval bilateral cataract resection. Brain: No acute or remote infarction, hemorrhage, hydrocephalus, or mass lesion/mass effect. Normal cerebral volume. Intermittently poor visualization of gray-white differentiation is attributed to scanner/technique. IMPRESSION: No acute finding or explanation for leg weakness. Electronically Signed   By: Marnee Spring M.D.   On: 07/19/2015 16:55   Ct Lumbar Spine Wo Contrast  07/19/2015  CLINICAL DATA:  Squeezing pain in the left leg.  Left leg weakness. EXAM: CT LUMBAR SPINE WITHOUT CONTRAST TECHNIQUE: Multidetector CT imaging of the lumbar spine was performed without intravenous contrast administration. Multiplanar CT image reconstructions were also generated. COMPARISON:  06/03/2013, 10/26/2013, 07/15/2015 FINDINGS: The alignment is anatomic. The vertebral body heights are maintained. There is no acute fracture or static listhesis. The paravertebral soft tissues are normal. The intraspinal soft tissues are not fully imaged on this examination due to poor soft tissue contrast, but there is no gross soft tissue abnormality. There is degenerative disc disease at L3-4. There are Schmorl's nodes in the inferior endplate of L3 and superior endplate of L4. The endplate changes involving the L4 vertebral body have progressed compared with the prior exam. The remainder the disc spaces are relatively well preserved. At T12-L1 there is no significant disc protrusion, foraminal stenosis or central canal stenosis. At L1-2 there is no significant disc protrusion, foraminal stenosis or central canal stenosis. At L2-3 there is no significant disc protrusion, foraminal stenosis or central canal stenosis. At L3-4 there is a mild broad-based disc bulge. There is mild  bilateral facet arthropathy. There is no significant foraminal or central canal stenosis. At L4-5 there is a broad-based disc bulge. There is severe right and mild left facet arthropathy. No significant foraminal or central canal stenosis. At L5-S1 there is moderate right and mild left facet arthropathy. No foraminal or central canal stenosis. There are degenerative changes of bilateral sacroiliac joints. IMPRESSION: 1. No acute injury of the lumbar spine. 2. Degenerative disc disease L3-4 with a mild broad-based disc bulge and mild bilateral facet arthropathy. 3. At L4-5 there is severe right and mild left facet arthropathy. 4. At L5-S1 there is moderate right and mild left facet arthropathy. Electronically Signed   By: Elige Ko   On: 07/19/2015 17:03   ASSESSMENT AND PLAN:   50 year old female patient with history of end-stage renal disease on peritoneal dialysis, hypertension, coronary artery disease, congestive heart failure presented to the emergency room with nausea and vomiting and abdominal discomfort.  # Acute peritonitis with nausea vomiting and diffuse abdominal pain. Peritoneal fluid with neutrophils greater than 70% Continue Levofloxacin 500 mg QOD (total 10 days from 07/15/15) No need for vanc per nephrology. -repeat PD fluid clearing. No fever. PD fluid cx neg Provided symptomatic treatment with anti-emetics and pain medications as needed  #. End-stage renal disease on peritoneal dialysis Continue peritoneal dialysis as recommended by nephrology. discussed with Dr. Wynelle Link Patient declining to convert  to hemodialysis  # Gastroparesis  PPI BID  Tolerating po diet well. Erythromycin discontinued  #. Hypertension Continue Coreg and Norvasc as tolerated  # Congestive heart failure, chronic, diastolic - continue home medication aspirin 81 mg, Plavix 75 mg, Coreg and amlodipine. - Continue statin   DVT prophylaxis subcutaneous heparin.  # Myoclonic jerks suspected due to  gabapentin which was discontinued by Dr. Emmaline Life from neurology -When necessary low-dose Ativan if needed. -No further workup at this time  Recommended by neurology. -CT head is negative   If patient needs to go to rehabilitation she needs to be converted to hemodialysis. This was discussed with patient and daughter and both are adamantly declining hemodialysis.  patient still wants to go to [facility]. We will awaiting a bed for [facility]. I'm not sure at this point how much [facility] will be helpful regarding patient's medical condition.  Patient is medically stable for discharge to rehabilitation. Barrier discharge is patient refusing to convert to hemodialysis  Case discussed with Care Management/Social Worker. Management plans discussed with the patient, family and they are in agreement.  CODE STATUS: full  DVT Prophylaxis: heparin  TOTAL TIME TAKING CARE OF THIS PATIENT: 25 minutes.  >50% time spent on counselling and coordination of care   Note: This dictation was prepared with Dragon dictation along with smaller phrase technology. Any transcriptional errors that result from this process are unintentional.  Janeece Blok M.D on 07/21/2015 at 11:33 AM  Between 7am to 6pm - Pager - [phone]  After 6pm go to [email or web address] - password [email or web address] [facility]  [facility]  Office  [phone]  CC: Primary care physician; Jarome Matin, MD

## 2015-07-21 NOTE — Progress Notes (Signed)
Subjective:   Peritoneal dialysis last night. Tolerated treatment well. UF of .   Continues to have tonic clonic jerking motions.  She is refusing hemodialysis and wants to be transferred to [facility].   Objective:  Vital signs in last 24 hours:  Temp:  [97.5 F (36.4 C)-98.1 F (36.7 C)] 97.5 F (36.4 C) (04/21 0406) Pulse Rate:  [56-111] 111 (04/21 0946) Resp:  [18] 18 (04/21 0406) BP: (97-121)/(46-89) 114/89 mmHg (04/21 0946) SpO2:  [92 %-95 %] 92 % (04/21 0406) Weight:  [58.65 kg (129 lb 4.8 oz)] 58.65 kg (129 lb 4.8 oz) (04/21 0900)  Weight change:  Filed Weights   07/18/15 0448 07/19/15 0838 07/21/15 0900  Weight: 127.053 kg (280 lb 1.6 oz) 127.053 kg (280 lb 1.6 oz) 58.65 kg (129 lb 4.8 oz)    Intake/Output:    Intake/Output Summary (Last 24 hours) at 07/21/15 1024 Last data filed at 07/21/15 8341  Gross per 24 hour  Intake     50 ml  Output   1554 ml  Net  -1504 ml     Physical Exam: General: NAD  HEENT Anicteric, moist oral mucous membranes  Neck supple  Pulm/lungs Normal effort, clear to auscultation bilaterally  CVS/Heart No rub or gallop  Abdomen:  Soft, nontender  Extremities: No peripheral edema, right BKA  Neurologic: Alert, oriented, speech normal, sudden tremor movements.   Skin: No acute rashes  Access: PD catheter, exit site nontender and clean       Basic Metabolic Panel:   Recent Labs Lab 07/15/15 1639 07/16/15 0803 07/17/15 0450 07/20/15 0547 07/21/15 0427  NA 136  --  135  --  128*  K 4.5  --  4.0  --  4.2  CL 100*  --  100*  --  91*  CO2 20*  --  24  --  22  GLUCOSE 155*  --  145*  --  157*  BUN 92*  --  77*  --  62*  CREATININE 10.47* 9.44* 9.51*  --  9.89*  CALCIUM 6.2*  --  6.4*  --  5.9*  MG  --   --   --   --  1.9  PHOS  --   --   --  11.7*  --      CBC:  Recent Labs Lab 07/15/15 1639 07/16/15 0803 07/17/15 0450 07/21/15 0427  WBC 9.7 10.8 8.8 9.5  HGB 10.7* 9.4* 9.4* 8.2*  HCT 31.8* 29.0* 28.2* 24.5*   MCV 83.8 83.8 84.0 84.1  PLT 352 322 289 250      Microbiology:  Recent Results (from the past 720 hour(s))  Body fluid culture     Status: None   Collection Time: 07/15/15 10:23 PM  Result Value Ref Range Status   Specimen Description PERITONEAL  Final   Special Requests Normal  Final   Gram Stain RARE WBC SEEN NO ORGANISMS SEEN   Final   Culture No growth aerobically or anaerobically.  Final   Report Status 07/19/2015 FINAL  Final  Culture, blood (routine x 2)     Status: None   Collection Time: 07/16/15  3:58 AM  Result Value Ref Range Status   Specimen Description BLOOD RIGHT WRIST  Final   Special Requests   Final    BOTTLES DRAWN AEROBIC AND ANAEROBIC 12CCAERO,6CCANA   Culture NO GROWTH 5 DAYS  Final   Report Status 07/21/2015 FINAL  Final  Culture, blood (routine x 2)     Status:  None   Collection Time: 07/16/15  3:58 AM  Result Value Ref Range Status   Specimen Description BLOOD RIGHT FOREARM  Final   Special Requests   Final    BOTTLES DRAWN AEROBIC AND ANAEROBIC 12CCAERO,2CCANA   Culture NO GROWTH 5 DAYS  Final   Report Status 07/21/2015 FINAL  Final    Coagulation Studies: No results for input(s): LABPROT, INR in the last 72 hours.  Urinalysis: No results for input(s): COLORURINE, LABSPEC, PHURINE, GLUCOSEU, HGBUR, BILIRUBINUR, KETONESUR, PROTEINUR, UROBILINOGEN, NITRITE, LEUKOCYTESUR in the last 72 hours.  Invalid input(s): APPERANCEUR    Imaging: Ct Head Wo Contrast  07/19/2015  CLINICAL DATA:  Left leg weakness. EXAM: CT HEAD WITHOUT CONTRAST TECHNIQUE: Contiguous axial images were obtained from the base of the skull through the vertex without intravenous contrast. COMPARISON:  05/23/2004 FINDINGS: Skull and Sinuses:Negative for fracture or destructive process. The visualized mastoids, middle ears, and imaged paranasal sinuses are clear. Visualized orbits: Interval bilateral cataract resection. Brain: No acute or remote infarction, hemorrhage,  hydrocephalus, or mass lesion/mass effect. Normal cerebral volume. Intermittently poor visualization of gray-white differentiation is attributed to scanner/technique. IMPRESSION: No acute finding or explanation for leg weakness. Electronically Signed   By: Marnee Spring M.D.   On: 07/19/2015 16:55   Ct Lumbar Spine Wo Contrast  07/19/2015  CLINICAL DATA:  Squeezing pain in the left leg.  Left leg weakness. EXAM: CT LUMBAR SPINE WITHOUT CONTRAST TECHNIQUE: Multidetector CT imaging of the lumbar spine was performed without intravenous contrast administration. Multiplanar CT image reconstructions were also generated. COMPARISON:  06/03/2013, 10/26/2013, 07/15/2015 FINDINGS: The alignment is anatomic. The vertebral body heights are maintained. There is no acute fracture or static listhesis. The paravertebral soft tissues are normal. The intraspinal soft tissues are not fully imaged on this examination due to poor soft tissue contrast, but there is no gross soft tissue abnormality. There is degenerative disc disease at L3-4. There are Schmorl's nodes in the inferior endplate of L3 and superior endplate of L4. The endplate changes involving the L4 vertebral body have progressed compared with the prior exam. The remainder the disc spaces are relatively well preserved. At T12-L1 there is no significant disc protrusion, foraminal stenosis or central canal stenosis. At L1-2 there is no significant disc protrusion, foraminal stenosis or central canal stenosis. At L2-3 there is no significant disc protrusion, foraminal stenosis or central canal stenosis. At L3-4 there is a mild broad-based disc bulge. There is mild bilateral facet arthropathy. There is no significant foraminal or central canal stenosis. At L4-5 there is a broad-based disc bulge. There is severe right and mild left facet arthropathy. No significant foraminal or central canal stenosis. At L5-S1 there is moderate right and mild left facet arthropathy. No  foraminal or central canal stenosis. There are degenerative changes of bilateral sacroiliac joints. IMPRESSION: 1. No acute injury of the lumbar spine. 2. Degenerative disc disease L3-4 with a mild broad-based disc bulge and mild bilateral facet arthropathy. 3. At L4-5 there is severe right and mild left facet arthropathy. 4. At L5-S1 there is moderate right and mild left facet arthropathy. Electronically Signed   By: Elige Ko   On: 07/19/2015 17:03     Medications:     . amLODipine  10 mg Oral Daily  . aspirin EC  81 mg Oral Daily  . calcium acetate  2,001 mg Oral TID WC  . calcium carbonate  1 tablet Oral TID WC  . carvedilol  6.25 mg Oral BID WC  .  clindamycin (CLEOCIN) IV  300 mg Intravenous Once  . clopidogrel  75 mg Oral Daily  . dialysis solution 2.5% low-MG/low-CA   Intraperitoneal Q24H  . dialysis solution 2.5% low-MG/low-CA   Intraperitoneal Q24H  . docusate sodium  100 mg Oral BID  . ezetimibe  10 mg Oral QHS  . gentamicin cream  1 application Topical Daily  . heparin  5,000 Units Subcutaneous 3 times per day  . insulin aspart  0-5 Units Subcutaneous QHS  . insulin aspart  0-9 Units Subcutaneous TID WC  . isosorbide mononitrate  60 mg Oral Daily  . levofloxacin  500 mg Oral Q48H  . lisinopril  40 mg Oral Daily  . pantoprazole  40 mg Oral BID  . ranolazine  500 mg Oral BID  . sodium chloride flush  3 mL Intravenous Q12H   sodium chloride, acetaminophen **OR** acetaminophen, albuterol, ALPRAZolam, docusate sodium, heparin, ondansetron **OR** ondansetron (ZOFRAN) IV, oxyCODONE-acetaminophen, polyethylene glycol, prochlorperazine, sodium chloride flush  Assessment/ Plan:  50 y.o. white  female with past medical history of end-stage renal disease on peritoneal dialysis, asthma, gastroparesis, diabetes mellitus, congestive heart failure, hypertension, endometriosis, coronary atherosclerosis, medical management  [facility]/[address]/peritoneal dialysis  1.  End-stage renal disease on peritoneal dialysis. Home PD prescription of 3 exchanges 8 hours with 2.2 litre fills. 2.5% dextrose - nightly PD  2. Acute peritonitis in PD patient. WBC cell count 690 with 73% neutrophils - PD fluid culture is no growth - Empiric treatment with broad-spectrum antibiotics vancomycin and Levaquin. Vanco last given on 4/16.   3. Anemia chronic kidney disease. - epo as outpatient. Holding now with neurologic changes.   4. Secondary hyperparathyroidism. phos elevated at 11.7 on 4/20. Not currently on a binder. - calcium acetate with meals.      LOS: 5 Aime Meloche 4/21/201710:24 AM

## 2015-07-21 NOTE — Care Management (Signed)
Barrier to discharge.Marland KitchenMarland KitchenReviewed attending note on discharge disposition. Patient on wait list at [facility]. Medically stable for discharge to rehab but no facilities will accept patient on peritoneal dialysis. Patient refusing HD. Will continue to follow progression and work towards a disposition.

## 2015-07-21 NOTE — Progress Notes (Signed)
Responded to Code Blue. Pt had a pulse and was breathing. Pt was responding. Placed pt on NRB. ABG drawn.

## 2015-07-21 NOTE — Progress Notes (Signed)
Patient was transferred to ICU room 10. Report was given to Ginger RN.

## 2015-07-21 NOTE — Progress Notes (Signed)
Patient requested for pain medication at 2120 for bilateral hip pain. The RN pulled 2 oxycodone to patient's room and found patient to be unresponsive. Sternal; rub performed but patient still did not responds. Patient had pulse so rapid responds called. Rapid responds was cancelled and code blue called later. No intervention was performed when the code blue called ( thus no chest compressions).  Patient gained consciousness (was moaning but not verbally responding to staff) when the doctors came to the bedside.  Orders given by Dr. Anne Hahn were implemendted. Patient was placed on cardiac monitor per Dr. Anne Hahn order. New order from DR. Willis to transfer patient to ICU. [name] was informed of the situation.

## 2015-07-21 NOTE — Progress Notes (Signed)
Subjective: Patient reports myoclonus is worse.  She is refusing dialysis.  Off Neurontin and Wellbutrin  Objective: Current vital signs: BP 114/89 mmHg  Pulse 111  Temp(Src) 97.5 F (36.4 C) (Oral)  Resp 18  Ht 5' 9.02" (1.753 m)  Wt 58.65 kg (129 lb 4.8 oz)  BMI 19.09 kg/m2  SpO2 92%  LMP 03/16/2015 Vital signs in last 24 hours: Temp:  [97.5 F (36.4 C)-98.1 F (36.7 C)] 97.5 F (36.4 C) (04/21 0406) Pulse Rate:  [56-111] 111 (04/21 0946) Resp:  [18] 18 (04/21 0406) BP: (97-121)/(46-89) 114/89 mmHg (04/21 0946) SpO2:  [92 %-95 %] 92 % (04/21 0406) Weight:  [58.65 kg (129 lb 4.8 oz)] 58.65 kg (129 lb 4.8 oz) (04/21 0900)  Intake/Output from previous day: 04/20 0701 - 04/21 0700 In: 290 [P.O.:290] Out: 862 [Urine:50] Intake/Output this shift: Total I/O In: -  Out: 1504 [Other:1504] Nutritional status: Diet - low sodium heart healthy Diet - low sodium heart healthy DIET SOFT Room service appropriate?: Yes; Fluid consistency:: Thin  Neurologic Exam: Mental Status: Alert, oriented, thought content appropriate. Speech fluent without evidence of aphasia. Able to follow 3 step commands without difficulty. Cranial Nerves: II: Discs flat bilaterally; Visual fields grossly normal, pupils equal, round, reactive to light and accommodation III,IV, VI: ptosis not present, extra-ocular motions intact bilaterally V,VII: smile symmetric, facial light touch sensation normal bilaterally VIII: hearing normal bilaterally IX,X: gag reflex present XI: bilateral shoulder shrug XII: midline tongue extension Motor: Right :Upper extremity 5/5Left: Upper extremity 5/5 Lower extremity 5/5Lower extremity Patient unable to lift due to pain. Gives full strength distally. Multiple episodes of myoclonus noted Sensory: Pain on light palpation of the lateral aspect of the left thigh  with less pain medially. No pain below the knee.  Deep Tendon Reflexes: 2+ in the upper extremities, absent in the lower extremities  Lab Results: Basic Metabolic Panel:  Recent Labs Lab 07/15/15 1639 07/16/15 0803 07/17/15 0450 07/20/15 0547 07/21/15 0427  NA 136  --  135  --  128*  K 4.5  --  4.0  --  4.2  CL 100*  --  100*  --  91*  CO2 20*  --  24  --  22  GLUCOSE 155*  --  145*  --  157*  BUN 92*  --  77*  --  62*  CREATININE 10.47* 9.44* 9.51*  --  9.89*  CALCIUM 6.2*  --  6.4*  --  5.9*  MG  --   --   --   --  1.9  PHOS  --   --   --  11.7*  --     Liver Function Tests:  Recent Labs Lab 07/15/15 1639  AST 16  ALT 13*  ALKPHOS 102  BILITOT 0.6  PROT 5.8*  ALBUMIN 2.7*    Recent Labs Lab 07/15/15 1639  LIPASE 23   No results for input(s): AMMONIA in the last 168 hours.  CBC:  Recent Labs Lab 07/15/15 1639 07/16/15 0803 07/17/15 0450 07/21/15 0427  WBC 9.7 10.8 8.8 9.5  HGB 10.7* 9.4* 9.4* 8.2*  HCT 31.8* 29.0* 28.2* 24.5*  MCV 83.8 83.8 84.0 84.1  PLT 352 322 289 250    Cardiac Enzymes:  Recent Labs Lab 07/15/15 2111  TROPONINI 0.05*    Lipid Panel: No results for input(s): CHOL, TRIG, HDL, CHOLHDL, VLDL, LDLCALC in the last 168 hours.  CBG:  Recent Labs Lab 07/20/15 0741 07/20/15 1144 07/20/15 1620 07/20/15 2106 07/21/15 0732  GLUCAP 185* 169* 156* 212* 130*    Microbiology: Results for orders placed or performed during the hospital encounter of 07/15/15  Body fluid culture     Status: None   Collection Time: 07/15/15 10:23 PM  Result Value Ref Range Status   Specimen Description PERITONEAL  Final   Special Requests Normal  Final   Gram Stain RARE WBC SEEN NO ORGANISMS SEEN   Final   Culture No growth aerobically or anaerobically.  Final   Report Status 07/19/2015 FINAL  Final  Culture, blood (routine x 2)     Status: None   Collection Time: 07/16/15  3:58 AM  Result Value Ref Range Status   Specimen  Description BLOOD RIGHT WRIST  Final   Special Requests   Final    BOTTLES DRAWN AEROBIC AND ANAEROBIC 12CCAERO,6CCANA   Culture NO GROWTH 5 DAYS  Final   Report Status 07/21/2015 FINAL  Final  Culture, blood (routine x 2)     Status: None   Collection Time: 07/16/15  3:58 AM  Result Value Ref Range Status   Specimen Description BLOOD RIGHT FOREARM  Final   Special Requests   Final    BOTTLES DRAWN AEROBIC AND ANAEROBIC 12CCAERO,2CCANA   Culture NO GROWTH 5 DAYS  Final   Report Status 07/21/2015 FINAL  Final    Coagulation Studies: No results for input(s): LABPROT, INR in the last 72 hours.  Imaging: Ct Head Wo Contrast  07/19/2015  CLINICAL DATA:  Left leg weakness. EXAM: CT HEAD WITHOUT CONTRAST TECHNIQUE: Contiguous axial images were obtained from the base of the skull through the vertex without intravenous contrast. COMPARISON:  05/23/2004 FINDINGS: Skull and Sinuses:Negative for fracture or destructive process. The visualized mastoids, middle ears, and imaged paranasal sinuses are clear. Visualized orbits: Interval bilateral cataract resection. Brain: No acute or remote infarction, hemorrhage, hydrocephalus, or mass lesion/mass effect. Normal cerebral volume. Intermittently poor visualization of gray-white differentiation is attributed to scanner/technique. IMPRESSION: No acute finding or explanation for leg weakness. Electronically Signed   By: Marnee Spring M.D.   On: 07/19/2015 16:55   Ct Lumbar Spine Wo Contrast  07/19/2015  CLINICAL DATA:  Squeezing pain in the left leg.  Left leg weakness. EXAM: CT LUMBAR SPINE WITHOUT CONTRAST TECHNIQUE: Multidetector CT imaging of the lumbar spine was performed without intravenous contrast administration. Multiplanar CT image reconstructions were also generated. COMPARISON:  06/03/2013, 10/26/2013, 07/15/2015 FINDINGS: The alignment is anatomic. The vertebral body heights are maintained. There is no acute fracture or static listhesis. The  paravertebral soft tissues are normal. The intraspinal soft tissues are not fully imaged on this examination due to poor soft tissue contrast, but there is no gross soft tissue abnormality. There is degenerative disc disease at L3-4. There are Schmorl's nodes in the inferior endplate of L3 and superior endplate of L4. The endplate changes involving the L4 vertebral body have progressed compared with the prior exam. The remainder the disc spaces are relatively well preserved. At T12-L1 there is no significant disc protrusion, foraminal stenosis or central canal stenosis. At L1-2 there is no significant disc protrusion, foraminal stenosis or central canal stenosis. At L2-3 there is no significant disc protrusion, foraminal stenosis or central canal stenosis. At L3-4 there is a mild broad-based disc bulge. There is mild bilateral facet arthropathy. There is no significant foraminal or central canal stenosis. At L4-5 there is a broad-based disc bulge. There is severe right and mild left facet arthropathy. No significant foraminal or central canal stenosis.  At L5-S1 there is moderate right and mild left facet arthropathy. No foraminal or central canal stenosis. There are degenerative changes of bilateral sacroiliac joints. IMPRESSION: 1. No acute injury of the lumbar spine. 2. Degenerative disc disease L3-4 with a mild broad-based disc bulge and mild bilateral facet arthropathy. 3. At L4-5 there is severe right and mild left facet arthropathy. 4. At L5-S1 there is moderate right and mild left facet arthropathy. Electronically Signed   By: Elige Ko   On: 07/19/2015 17:03    Medications:  I have reviewed the patient's current medications. Scheduled: . amLODipine  10 mg Oral Daily  . aspirin EC  81 mg Oral Daily  . calcium acetate  2,001 mg Oral TID WC  . calcium carbonate  1 tablet Oral TID WC  . carvedilol  6.25 mg Oral BID WC  . clindamycin (CLEOCIN) IV  300 mg Intravenous Once  . clopidogrel  75 mg Oral  Daily  . dialysis solution 2.5% low-MG/low-CA   Intraperitoneal Q24H  . dialysis solution 2.5% low-MG/low-CA   Intraperitoneal Q24H  . docusate sodium  100 mg Oral BID  . ezetimibe  10 mg Oral QHS  . gentamicin cream  1 application Topical Daily  . heparin  5,000 Units Subcutaneous 3 times per day  . insulin aspart  0-5 Units Subcutaneous QHS  . insulin aspart  0-9 Units Subcutaneous TID WC  . isosorbide mononitrate  60 mg Oral Daily  . levofloxacin  500 mg Oral Q48H  . lisinopril  40 mg Oral Daily  . pantoprazole  40 mg Oral BID  . ranolazine  500 mg Oral BID  . sodium chloride flush  3 mL Intravenous Q12H    Assessment/Plan: Patient continues to have myoclonus.  Possible medical offending agents have been discontinued but patient has multiple metabolic issues that may be contributing as well.  This is worsened by patient's refusal of dialysis.  In this clinical scenario do not expect them to improve.  Recommendations: 1.  EEG 2.  To remain off Neurontin and Wellbutrin.     LOS: 5 days   Thana Farr, MD Neurology [phone] 07/21/2015  10:49 AM

## 2015-07-21 NOTE — Progress Notes (Signed)
Dr. Allena Katz at bedside. Orders for 1000mg  depakote IV loading dose once then 500mg  PO starting tomorrow. Dr. Thad Ranger aware - no further workup scans needed at this time. Seizure precautions initiated with side rails padded and bed alarm on. Patient is sleeping now, occassional moaning/grunting. Will continue to monitor.

## 2015-07-21 NOTE — Progress Notes (Signed)
Patient ID: Kimberly Montgomery, female   DOB: 03/30/1966, 50 y.o.   MRN: [id number] Called by nurse patient had tonic-clonic seizure after she came back from EEG. It was witnessed by RN. Vitals are stable. Blood sugar 185. Patient currently post appears postictal. She opens eyes and follows basic simple commands. Discussed with neurology Dr. Emmaline Life. Recommend start patient on Depakote 1 g IV loading dose and thereafter 500 mg by mouth twice a day No further radiologic imaging studies recommended. We'll continue to monitor. Seizure precautions will be carried out.

## 2015-07-22 DIAGNOSIS — R569 Unspecified convulsions: Secondary | ICD-10-CM

## 2015-07-22 LAB — GLUCOSE, CAPILLARY
GLUCOSE-CAPILLARY: 185 mg/dL — AB (ref 65–99)
GLUCOSE-CAPILLARY: 104 mg/dL — AB (ref 65–99)
Glucose-Capillary: 127 mg/dL — ABNORMAL HIGH (ref 65–99)
Glucose-Capillary: 71 mg/dL (ref 65–99)

## 2015-07-22 LAB — BODY FLUID CELL COUNT WITH DIFFERENTIAL
EOS FL: 0 %
LYMPHS FL: 21 %
MONOCYTE-MACROPHAGE-SEROUS FLUID: 7 %
NEUTROPHIL FLUID: 72 %
Total Nucleated Cell Count, Fluid: 11 cu mm

## 2015-07-22 LAB — VALPROIC ACID LEVEL

## 2015-07-22 LAB — MRSA PCR SCREENING: MRSA BY PCR: NEGATIVE

## 2015-07-22 MED ORDER — LEVETIRACETAM 500 MG PO TABS: 500.0000 mg | ORAL_TABLET | Freq: Once | ORAL | Status: DC

## 2015-07-22 MED ORDER — LEVOFLOXACIN IN D5W 500 MG/100ML IV SOLN
500.0000 mg | INTRAVENOUS | Status: DC
  Administered 2015-07-24 – 2015-07-26 (×2): 500 mg via INTRAVENOUS
  Filled 2015-07-22 (×2): qty 100

## 2015-07-22 MED ORDER — VANCOMYCIN HCL IN DEXTROSE 1-5 GM/200ML-% IV SOLN
1000.0000 mg | Freq: Once | INTRAVENOUS | Status: AC
  Administered 2015-07-22: 1000 mg via INTRAVENOUS
  Filled 2015-07-22: qty 200

## 2015-07-22 MED ORDER — METRONIDAZOLE IN NACL 5-0.79 MG/ML-% IV SOLN
500.0000 mg | Freq: Three times a day (TID) | INTRAVENOUS | Status: DC
  Administered 2015-07-22: 500 mg via INTRAVENOUS
  Filled 2015-07-22 (×2): qty 100

## 2015-07-22 MED ORDER — INSULIN GLARGINE 100 UNIT/ML ~~LOC~~ SOLN: 15.0000 [IU] | Freq: Every day | SUBCUTANEOUS | Status: DC

## 2015-07-22 MED ORDER — DIVALPROEX SODIUM 500 MG PO DR TAB: 500.0000 mg | DELAYED_RELEASE_TABLET | Freq: Two times a day (BID) | ORAL | Status: DC

## 2015-07-22 MED ORDER — LEVOFLOXACIN IN D5W 500 MG/100ML IV SOLN: 500.0000 mg | INTRAVENOUS | Status: DC

## 2015-07-22 MED ORDER — LEVOFLOXACIN IN D5W 750 MG/150ML IV SOLN
750.0000 mg | Freq: Once | INTRAVENOUS | Status: AC
  Administered 2015-07-22: 750 mg via INTRAVENOUS
  Filled 2015-07-22: qty 150

## 2015-07-22 MED ORDER — CALCIUM ACETATE (PHOS BINDER) 667 MG PO CAPS: 2001.0000 mg | ORAL_CAPSULE | Freq: Three times a day (TID) | ORAL | Status: DC

## 2015-07-22 MED ORDER — LORAZEPAM 2 MG/ML IJ SOLN: 1.0000 mg | INTRAMUSCULAR | Status: DC | PRN

## 2015-07-22 MED ORDER — SODIUM CHLORIDE 0.9 % IV SOLN
1000.0000 mg | Freq: Once | INTRAVENOUS | Status: AC
  Administered 2015-07-22: 1000 mg via INTRAVENOUS
  Filled 2015-07-22: qty 10

## 2015-07-22 MED ORDER — LEVETIRACETAM 500 MG PO TABS
500.0000 mg | ORAL_TABLET | Freq: Every day | ORAL | Status: DC
Start: 2015-07-23 — End: 2015-07-23
  Administered 2015-07-23: 500 mg via ORAL
  Filled 2015-07-22: qty 1

## 2015-07-22 NOTE — Progress Notes (Signed)
Called by nursing re: seizure activity. Described as generalized tonic-clonic lasting approximately 1 minute.  This is new-onset seizure activity.  Patient had been transferred to ICU shortly before call following an unresponsive episode.  Vitals were normal during rapid response. After seizure witnessed in ICU the patient required O2 by [place] due to desats from intermittent apnea.  Post-ictal on my initial examination.  Physician who attended "code" suggested further antibiotic coverage and ordered aztreonam due to anaphylaxis to cephalosporins and PCN (though patient had been on Zosyn during this hospitalization). Discussed with pharmacy preferred coverage of anaerobes given no evidence of peritonitis at this point. Switched to Flagyl.  Also, Depakote started for fasciculations. Now generalized seizure activity so will start Keppra.  Repeated balismic movements noted while at beside.  Keppra iv has improved symptoms.    Hospital course reviewed.  Blood and peritoneal fluid cultures negative, finalized.  No issues with PD except fibrin clot noted during first hospital treatment (cleared).  No issues with PD at home. Plan was to try to get patient home soon but she is obviously not ready for discharge at this time.  She will not need perm cath placement as planned previously to help with SNF placement.  (Also, the patient has been adamant that she does not want HD).  Her condition has deteriorated.  Transfer to [facility] has been discussed.  Will re-initiate transfer process for medical necessity.

## 2015-07-22 NOTE — Clinical Social Work Note (Signed)
Patient with new onset seizures and has been transferred to ICU and is to transfer to [facility] when bed available. York Spaniel MSW,LCSW [phone]

## 2015-07-22 NOTE — Discharge Summary (Signed)
[facility] - [place] at [facility]   PATIENT NAME: Kimberly Montgomery    MR#:  [id number]  DATE OF BIRTH:  [birth year]  DATE OF ADMISSION:  07/15/2015 ADMITTING PHYSICIAN: Ihor Austin, MD  DATE OF DISCHARGE: 07/22/15  PRIMARY CARE PHYSICIAN: Jarome Matin, MD    ADMISSION DIAGNOSIS:  SBP (spontaneous bacterial peritonitis) (HCC) [K65.2] Vomiting and diarrhea [R11.10, R19.7] ESRD on peritoneal dialysis (HCC) [N18.6, Z99.2] Type 2 diabetes mellitus with chronic kidney disease on chronic dialysis, unspecified long term insulin use status (HCC) [E11.22, N18.6]  DISCHARGE DIAGNOSIS:  Generalized tonic-clonic seizures-new Acute peritonitis in patient with prior to dialysis on Levaquin for  total 10 days treatment started on 07/15/2015 Myoclonic jerks End-stage renal disease on peritoneal dialysis Hypertension Type 2 diabetes on insulin  SECONDARY DIAGNOSIS:   Past Medical History  Diagnosis Date  . Renal disorder   . Hypertension   . CHF (congestive heart failure) (HCC)   . Diabetes mellitus without complication (HCC)   . Gastroparesis   . Asthma   . ESRD (end stage renal disease) (HCC)   . Endometriosis   . CAD (coronary artery disease)   . HLD (hyperlipidemia)   . GERD (gastroesophageal reflux disease)     HOSPITAL COURSE:   50 year old female patient with history of end-stage renal disease on peritoneal dialysis, hypertension, coronary artery disease, congestive heart failure presented to the emergency room with nausea and vomiting and abdominal discomfort.  #Generalized tonic-clonic seizures new onset started on 07/21/2015 Patient transferred to ICU for close monitoring She was loaded with IV Depakote thousand milligrams on 07/21/2015--- continue Depakote 500 mg twice a day per neurology recommendation Patient was given loading dose of IV Keppra thousand milligrams during the nighttime with recurrent seizures EEG per Dr. Emmaline Life shows  metabolic changes Repeat CT head for 21 negative Patient currently hemodynamically stable however requires close monitoring for her recurrent seizures including continuous EEG monitoring This was discussed with an ICU attending Dr. Orson Aloe at [facility]. Patient is on the transfer list for [facility] -Patient received broad-spectrum antibiotics in the form of IV vancomycin, Flagyl, IV aztreonam last night. -Apparently she currently doesn't have any source of infection I'll hold off on these antibiotics and continue her Levaquin for acute peritonitis. We'll continue monitoring for white count. Follow blood cultures.  # Acute peritonitis with nausea vomiting and diffuse abdominal pain. Peritoneal fluid with neutrophils greater than 70% Continue Levofloxacin 500 mg QOD (total 10 days from 07/15/15) No need for vanc per nephrology. -repeat PD fluid clearing. No fever. PD fluid cx neg Provided symptomatic treatment with anti-emetics and pain medications as needed  #. End-stage renal disease on peritoneal dialysis Continue peritoneal dialysis as recommended by nephrology. discussed with Dr. Wynelle Link Patient declining to convert to hemodialysis  # Gastroparesis  PPI BID  Tolerating po diet well. Erythromycin discontinued  #. Hypertension Continue Coreg and Norvasc as tolerated  # Congestive heart failure, chronic, diastolic - continue home medication aspirin 81 mg, Plavix 75 mg, Coreg and amlodipine. - Continue statin   #DVT prophylaxis subcutaneous heparin.  # Myoclonic jerks suspected due to gabapentin which was discontinued by Dr. Thad Ranger from neurology -When necessary low-dose Ativan if needed. -No further workup at this time -CT head is negative  [name] [name] updated. She understands patient is on the wait list for an ICU transfer at [facility]. CONSULTS OBTAINED:  Treatment Team:  Mosetta Pigeon, MD Kym Groom, MD Annice Needy, MD  DRUG ALLERGIES:   Allergies  Allergen Reactions  . Cephalosporins Anaphylaxis  . Penicillins Anaphylaxis and Other (See Comments)    Has patient had a PCN reaction causing immediate rash, facial/tongue/throat swelling, SOB or lightheadedness with hypotension: Yes Has patient had a PCN reaction causing severe rash involving mucus membranes or skin necrosis: No Has patient had a PCN reaction that required hospitalization No Has patient had a PCN reaction occurring within the last 10 years: No If all of the above answers are "NO", then may proceed with Cephalosporin use.  [name] Phenergan [Promethazine Hcl] Nausea And Vomiting    DISCHARGE MEDICATIONS:   Current Discharge Medication List    START taking these medications   Details  calcium acetate (PHOSLO) 667 MG capsule Take 3 capsules (2,001 mg total) by mouth 3 (three) times daily with meals. Qty: 90 capsule, Refills: 0    divalproex (DEPAKOTE) 500 MG DR tablet Take 1 tablet (500 mg total) by mouth 2 (two) times daily. Qty: 60 tablet, Refills: 0    levofloxacin (LEVAQUIN) 500 MG tablet Take 1 tablet (500 mg total) by mouth every other day. Qty: 5 tablet, Refills: 0    levofloxacin (LEVAQUIN) 500 MG/100ML SOLN Inject 100 mLs (500 mg total) into the vein every other day. Qty: 1000 mL, Refills: 0    LORazepam (ATIVAN) 2 MG/ML injection Inject 0.5 mLs (1 mg total) into the vein every 2 (two) hours as needed for seizure. Qty: 1 mL, Refills: 0    oxyCODONE-acetaminophen (PERCOCET/ROXICET) 5-325 MG tablet Take 1-2 tablets by mouth every 6 (six) hours as needed for moderate pain. Qty: 30 tablet, Refills: 0      CONTINUE these medications which have CHANGED   Details  insulin glargine (LANTUS) 100 UNIT/ML injection Inject 0.15 mLs (15 Units total) into the skin at bedtime. Qty: 10 mL, Refills: 11    ondansetron (ZOFRAN ODT) 4 MG disintegrating tablet Take 1 tablet (4 mg total) by mouth every 6 (six) hours as needed for nausea or vomiting. Qty: 20 tablet, Refills: 0       CONTINUE these medications which have NOT CHANGED   Details  acetaminophen (TYLENOL) 500 MG tablet Take 1,000 mg by mouth every 6 (six) hours as needed for mild pain.     albuterol (PROVENTIL HFA;VENTOLIN HFA) 108 (90 Base) MCG/ACT inhaler Inhale 2 puffs into the lungs every 6 (six) hours as needed for wheezing or shortness of breath.    amLODipine (NORVASC) 10 MG tablet Take 10 mg by mouth daily.    aspirin EC 81 MG tablet Take 81 mg by mouth daily.    calcium acetate, Phos Binder, (PHOSLYRA) 667 MG/5ML SOLN Take 2,668 mg by mouth 3 (three) times daily with meals.     carvedilol (COREG) 6.25 MG tablet Take 6.25 mg by mouth 2 (two) times daily with a meal.    clopidogrel (PLAVIX) 75 MG tablet Take 75 mg by mouth daily.    docusate sodium (COLACE) 100 MG capsule Take 100 mg by mouth at bedtime as needed for mild constipation.    ezetimibe-simvastatin (VYTORIN) 10-40 MG tablet Take 1 tablet by mouth at bedtime.    isosorbide mononitrate (IMDUR) 60 MG 24 hr tablet Take 1 tablet (60 mg total) by mouth daily. Qty: 30 tablet, Refills: 00    lisinopril (PRINIVIL,ZESTRIL) 40 MG tablet Take 40 mg by mouth daily. Refills: 3    nitroGLYCERIN (NITROSTAT) 0.4 MG SL tablet Place 0.4 mg under the tongue every 5 (five) minutes as needed for chest pain. Reported on 07/10/2015  omeprazole (PRILOSEC) 40 MG capsule Take 40 mg by mouth 2 (two) times daily.    polyethylene glycol (MIRALAX / GLYCOLAX) packet Take 17 g by mouth daily as needed for mild constipation.    ranolazine (RANEXA) 500 MG 12 hr tablet Take 1 tablet (500 mg total) by mouth 2 (two) times daily. Qty: 60 tablet, Refills: 0      STOP taking these medications     aspirin-acetaminophen-caffeine (EXCEDRIN MIGRAINE) 250-250-65 MG tablet      buPROPion (WELLBUTRIN SR) 150 MG 12 hr tablet      gabapentin (NEURONTIN) 600 MG tablet      insulin aspart (NOVOLOG) 100 UNIT/ML injection      torsemide (DEMADEX) 100 MG tablet          If you experience worsening of your admission symptoms, develop shortness of breath, life threatening emergency, suicidal or homicidal thoughts you must seek medical attention immediately by calling 911 or calling your MD immediately  if symptoms less severe.  You Must read complete instructions/literature along with all the possible adverse reactions/side effects for all the Medicines you take and that have been prescribed to you. Take any new Medicines after you have completely understood and accept all the possible adverse reactions/side effects.   Please note  You were cared for by a hospitalist during your hospital stay. If you have any questions about your discharge medications or the care you received while you were in the hospital after you are discharged, you can call the unit and asked to speak with the hospitalist on call if the hospitalist that took care of you is not available. Once you are discharged, your primary care physician will handle any further medical issues. Please note that NO REFILLS for any discharge medications will be authorized once you are discharged, as it is imperative that you return to your primary care physician (or establish a relationship with a primary care physician if you do not have one) for your aftercare needs so that they can reassess your need for medications and monitor your lab values. Today   SUBJECTIVE   Awakens on verbal commands. Denies any complaints at present. No fever. Blood pressure stable. Complains of leg pain.  VITAL SIGNS:  Blood pressure 99/61, pulse 70, temperature 97.8 F (36.6 C), temperature source Oral, resp. rate 18, height 5' 9.02" (1.753 m), weight 131.9 kg (290 lb 12.6 oz), last menstrual period 03/16/2015, SpO2 96 %.  I/O:   Intake/Output Summary (Last 24 hours) at 07/22/15 0744 Last data filed at 07/22/15 0243  Gross per 24 hour  Intake    400 ml  Output   1504 ml  Net  -1104 ml    PHYSICAL EXAMINATION:   GENERAL:  50 y.o.-year-old patient lying in the bed with no acute distress. Chronically ill EYES: Pupils equal, round, reactive to light and accommodation. No scleral icterus. Extraocular muscles intact.  HEENT: Head atraumatic, normocephalic. Oropharynx and nasopharynx clear.  NECK:  Supple, no jugular venous distention. No thyroid enlargement, no tenderness.  LUNGS: Normal breath sounds bilaterally, no wheezing, rales,rhonchi or crepitation. No use of accessory muscles of respiration.  CARDIOVASCULAR: S1, S2 normal. No murmurs, rubs, or gallops.  ABDOMEN: Soft, non-tender, non-distended. Bowel sounds present. No organomegaly or mass.  EXTREMITIES: No pedal edema, cyanosis, or clubbing. Right prosthetic leg NEUROLOGIC: Moves all extremities well. No focal weakness. Generalized weakness.  PSYCHIATRIC: The patient is alert and oriented x 2 SKIN: No obvious rash, lesion, or ulcer.   DATA REVIEW:  CBC   Recent Labs Lab 07/21/15 2150  WBC 14.5*  HGB 8.1*  HCT 24.6*  PLT 302    Chemistries   Recent Labs Lab 07/15/15 1639  07/21/15 0427 07/21/15 2150  NA 136  < > 128* 126*  K 4.5  < > 4.2 4.8  CL 100*  < > 91* 89*  CO2 20*  < > 22 20*  GLUCOSE 155*  < > 157* 106*  BUN 92*  < > 62* 66*  CREATININE 10.47*  < > 9.89* 10.62*  CALCIUM 6.2*  < > 5.9* 6.0*  MG  --   --  1.9  --   AST 16  --   --   --   ALT 13*  --   --   --   ALKPHOS 102  --   --   --   BILITOT 0.6  --   --   --   < > = values in this interval not displayed.  Microbiology Results   Recent Results (from the past 240 hour(s))  Body fluid culture     Status: None   Collection Time: 07/15/15 10:23 PM  Result Value Ref Range Status   Specimen Description PERITONEAL  Final   Special Requests Normal  Final   Gram Stain RARE WBC SEEN NO ORGANISMS SEEN   Final   Culture No growth aerobically or anaerobically.  Final   Report Status 07/19/2015 FINAL  Final  Culture, blood (routine x 2)     Status: None    Collection Time: 07/16/15  3:58 AM  Result Value Ref Range Status   Specimen Description BLOOD RIGHT WRIST  Final   Special Requests   Final    BOTTLES DRAWN AEROBIC AND ANAEROBIC 12CCAERO,6CCANA   Culture NO GROWTH 5 DAYS  Final   Report Status 07/21/2015 FINAL  Final  Culture, blood (routine x 2)     Status: None   Collection Time: 07/16/15  3:58 AM  Result Value Ref Range Status   Specimen Description BLOOD RIGHT FOREARM  Final   Special Requests   Final    BOTTLES DRAWN AEROBIC AND ANAEROBIC 12CCAERO,2CCANA   Culture NO GROWTH 5 DAYS  Final   Report Status 07/21/2015 FINAL  Final  MRSA PCR Screening     Status: None   Collection Time: 07/21/15 10:45 PM  Result Value Ref Range Status   MRSA by PCR NEGATIVE NEGATIVE Final    Comment:        The GeneXpert MRSA Assay (FDA approved for NASAL specimens only), is one component of a comprehensive MRSA colonization surveillance program. It is not intended to diagnose MRSA infection nor to guide or monitor treatment for MRSA infections.     RADIOLOGY:  Ct Head Wo Contrast  07/21/2015  CLINICAL DATA:  Altered mental status. Noncommunicative and would not follow commands. Seizures earlier today. EXAM: CT HEAD WITHOUT CONTRAST TECHNIQUE: Contiguous axial images were obtained from the base of the skull through the vertex without intravenous contrast. COMPARISON:  07/19/2015 FINDINGS: Ventricles and sulci appear symmetrical. No ventricular dilatation. No mass effect or midline shift. No abnormal extra-axial fluid collections. Gray-white matter junctions are distinct. Basal cisterns are not effaced. No evidence of acute intracranial hemorrhage. No depressed skull fractures. Mild mucosal thickening in the paranasal sinuses. Mastoid air cells are not opacified. Vascular calcifications. Similar appearance to previous study. IMPRESSION: No acute intracranial abnormalities demonstrated. Electronically Signed   By: Burman Nieves M.D.   On:  07/21/2015  22:43     Management plans discussed with the patient, family and they are in agreement.  CODE STATUS:     Code Status Orders        Start     Ordered   07/16/15 0626  Full code   Continuous     07/16/15 0625    Code Status History    Date Active Date Inactive Code Status Order ID Comments User Context   07/10/2015  1:49 AM 07/12/2015  1:01 PM Full Code [id number]  Oralia Manis, MD Inpatient   05/12/2015  3:46 PM 05/16/2015  5:35 PM Full Code [id number]  Auburn Bilberry, MD ED      TOTAL TIME TAKING CARE OF THIS PATIENT: .    Roxsana Riding M.D on 07/22/2015 at 7:44 AM  Between 7am to 6pm - Pager - [phone] After 6pm go to [email or web address] - password [email or web address] [facility]  [facility] [place] [facility]  Office  [phone]  CC: Primary care physician; Jarome Matin, MD

## 2015-07-22 NOTE — Progress Notes (Signed)
Subjective:   Seizures overnight. Transferred to ICU.   Peritoneal dialysis overnight. UF of . Effluent pinkish today  Son at bedside.   Patient alert and oriented  Objective:  Vital signs in last 24 hours:  Temp:  [97.8 F (36.6 C)-98.3 F (36.8 C)] 98.2 F (36.8 C) (04/22 0800) Pulse Rate:  [66-82] 70 (04/22 0900) Resp:  [12-20] 14 (04/22 0900) BP: (91-142)/(49-74) 114/65 mmHg (04/22 1020) SpO2:  [91 %-100 %] 98 % (04/22 0900) Weight:  [131.9 kg (290 lb 12.6 oz)] 131.9 kg (290 lb 12.6 oz) (04/22 0249)  Weight change:  Filed Weights   07/19/15 0838 07/21/15 0900 07/22/15 0249  Weight: 127.053 kg (280 lb 1.6 oz) 58.65 kg (129 lb 4.8 oz) 131.9 kg (290 lb 12.6 oz)    Intake/Output:    Intake/Output Summary (Last 24 hours) at 07/22/15 1157 Last data filed at 07/22/15 1029  Gross per 24 hour  Intake    556 ml  Output    129 ml  Net    427 ml     Physical Exam: General: NAD  HEENT Anicteric, moist oral mucous membranes  Neck supple  Pulm/lungs Normal effort, clear to auscultation bilaterally  CVS/Heart No rub or gallop  Abdomen:  Soft, nontender  Extremities: No peripheral edema, right BKA  Neurologic: Alert, oriented, speech normal, lethargic  Skin: No acute rashes  Access: PD catheter, exit site nontender and clean       Basic Metabolic Panel:   Recent Labs Lab 07/15/15 1639 07/16/15 0803 07/17/15 0450 07/20/15 0547 07/21/15 0427 07/21/15 2150  NA 136  --  135  --  128* 126*  K 4.5  --  4.0  --  4.2 4.8  CL 100*  --  100*  --  91* 89*  CO2 20*  --  24  --  22 20*  GLUCOSE 155*  --  145*  --  157* 106*  BUN 92*  --  77*  --  62* 66*  CREATININE 10.47* 9.44* 9.51*  --  9.89* 10.62*  CALCIUM 6.2*  --  6.4*  --  5.9* 6.0*  MG  --   --   --   --  1.9  --   PHOS  --   --   --  11.7*  --   --      CBC:  Recent Labs Lab 07/15/15 1639 07/16/15 0803 07/17/15 0450 07/21/15 0427 07/21/15 2150  WBC 9.7 10.8 8.8 9.5 14.5*  NEUTROABS  --    --   --   --  11.9*  HGB 10.7* 9.4* 9.4* 8.2* 8.1*  HCT 31.8* 29.0* 28.2* 24.5* 24.6*  MCV 83.8 83.8 84.0 84.1 83.0  PLT 352 322 289 250 302      Microbiology:  Recent Results (from the past 720 hour(s))  Body fluid culture     Status: None   Collection Time: 07/15/15 10:23 PM  Result Value Ref Range Status   Specimen Description PERITONEAL  Final   Special Requests Normal  Final   Gram Stain RARE WBC SEEN NO ORGANISMS SEEN   Final   Culture No growth aerobically or anaerobically.  Final   Report Status 07/19/2015 FINAL  Final  Culture, blood (routine x 2)     Status: None   Collection Time: 07/16/15  3:58 AM  Result Value Ref Range Status   Specimen Description BLOOD RIGHT WRIST  Final   Special Requests   Final    BOTTLES DRAWN AEROBIC  AND ANAEROBIC 12CCAERO,6CCANA   Culture NO GROWTH 5 DAYS  Final   Report Status 07/21/2015 FINAL  Final  Culture, blood (routine x 2)     Status: None   Collection Time: 07/16/15  3:58 AM  Result Value Ref Range Status   Specimen Description BLOOD RIGHT FOREARM  Final   Special Requests   Final    BOTTLES DRAWN AEROBIC AND ANAEROBIC 12CCAERO,2CCANA   Culture NO GROWTH 5 DAYS  Final   Report Status 07/21/2015 FINAL  Final  MRSA PCR Screening     Status: None   Collection Time: 07/21/15 10:45 PM  Result Value Ref Range Status   MRSA by PCR NEGATIVE NEGATIVE Final    Comment:        The GeneXpert MRSA Assay (FDA approved for NASAL specimens only), is one component of a comprehensive MRSA colonization surveillance program. It is not intended to diagnose MRSA infection nor to guide or monitor treatment for MRSA infections.     Coagulation Studies: No results for input(s): LABPROT, INR in the last 72 hours.  Urinalysis: No results for input(s): COLORURINE, LABSPEC, PHURINE, GLUCOSEU, HGBUR, BILIRUBINUR, KETONESUR, PROTEINUR, UROBILINOGEN, NITRITE, LEUKOCYTESUR in the last 72 hours.  Invalid input(s): APPERANCEUR     Imaging: Ct Head Wo Contrast  07/21/2015  CLINICAL DATA:  Altered mental status. Noncommunicative and would not follow commands. Seizures earlier today. EXAM: CT HEAD WITHOUT CONTRAST TECHNIQUE: Contiguous axial images were obtained from the base of the skull through the vertex without intravenous contrast. COMPARISON:  07/19/2015 FINDINGS: Ventricles and sulci appear symmetrical. No ventricular dilatation. No mass effect or midline shift. No abnormal extra-axial fluid collections. Gray-white matter junctions are distinct. Basal cisterns are not effaced. No evidence of acute intracranial hemorrhage. No depressed skull fractures. Mild mucosal thickening in the paranasal sinuses. Mastoid air cells are not opacified. Vascular calcifications. Similar appearance to previous study. IMPRESSION: No acute intracranial abnormalities demonstrated. Electronically Signed   By: Burman Nieves M.D.   On: 07/21/2015 22:43     Medications:     . amLODipine  10 mg Oral Daily  . aspirin EC  81 mg Oral Daily  . calcium acetate  2,001 mg Oral TID WC  . calcium carbonate  1 tablet Oral TID WC  . carvedilol  6.25 mg Oral BID WC  . clopidogrel  75 mg Oral Daily  . dialysis solution 2.5% low-MG/low-CA   Intraperitoneal Q24H  . dialysis solution 2.5% low-MG/low-CA   Intraperitoneal Q24H  . divalproex  500 mg Oral BID  . docusate sodium  100 mg Oral BID  . ezetimibe  10 mg Oral QHS  . gentamicin cream  1 application Topical Daily  . heparin  5,000 Units Subcutaneous 3 times per day  . insulin aspart  0-5 Units Subcutaneous QHS  . insulin aspart  0-9 Units Subcutaneous TID WC  . isosorbide mononitrate  60 mg Oral Daily  . [START ON 07/23/2015] levETIRAcetam  500 mg Oral Daily  . [START ON 07/24/2015] levofloxacin (LEVAQUIN) IV  500 mg Intravenous Q48H  . lisinopril  40 mg Oral Daily  . LORazepam  0.5 mg Intravenous Once  . pantoprazole  40 mg Oral BID  . ranolazine  500 mg Oral BID  . sodium chloride flush   3 mL Intravenous Q12H   sodium chloride, acetaminophen **OR** acetaminophen, albuterol, docusate sodium, heparin, LORazepam, ondansetron **OR** ondansetron (ZOFRAN) IV, oxyCODONE-acetaminophen, polyethylene glycol, prochlorperazine, sodium chloride flush  Assessment/ Plan:  50 y.o. white  female with past  medical history of end-stage renal disease on peritoneal dialysis, asthma, gastroparesis, diabetes mellitus, congestive heart failure, hypertension, endometriosis, coronary atherosclerosis, medical management  [facility]/[address]/peritoneal dialysis  1. End-stage renal disease on peritoneal dialysis. Home PD prescription of 3 exchanges 8 hours with 2.2 litre fills. 2.5% dextrose - nightly PD  2. Acute peritonitis in PD patient. WBC cell count 690 with 73% neutrophils - PD fluid culture is no growth - Empiric treatment with broad-spectrum antibiotics vancomycin and Levaquin. Vanco last given on 4/16.  - Cell count today.   3. Anemia chronic kidney disease. - epo as outpatient. Holding now with neurologic changes.   4. Secondary hyperparathyroidism. phos elevated at 11.7 on 4/20. - calcium acetate with meals.      LOS: 6 Timithy Arons 4/22/201711:57 AM

## 2015-07-22 NOTE — Plan of Care (Signed)
Problem: Medication: Goal: Risk for medication side effects will decrease Outcome: Progressing Pt admitted from 242 post seizure activity, pt postictal phase (unresponsive).  Pt suffer witness tonic/clonic sz in CCU 10, Dr. Sheryle Hail paged and made aware. MD at bedside, care reviewed with RN and family. Family would like for pt to be transferred to [facility]. Keppra added to pt regimen. Neuro consult in the mean time.   Problem: Safety: Goal: Verbalization of understanding the information provided will improve Outcome: Progressing O2 and suction set-up, family at bedside. Side rails padded, anti seizure meds started. Pt bit her tongue with seizure activity on 2A.

## 2015-07-22 NOTE — Progress Notes (Signed)
Prime doc paged.  Ca 6.0 Seizure activity.  MD Sheryle Hail) at bedside, orders received.

## 2015-07-22 NOTE — Progress Notes (Signed)
Pharmacy Antibiotic Note  Kimberly Montgomery is a 50 y.o. female admitted on 07/15/2015 with SBP.  Pharmacy has been consulted for vancomycin and aztreonam dosing.  Plan: TBW ~130 kg per RN DW 90kg Vd 63L Patient to start PD per note 2500 mg x1 load dose given on 4/16 Random vancomycin level 4/21 PM is 16. 1 gram x1 ordered. Check random level tomorrow AM 4/23.  Aztreonam 2 grams x1 given as load dose. 500mg  q 6 hours ordered as continuation.  Height: 5' 9.02" (175.3 cm) Weight: 129 lb 4.8 oz (58.65 kg) IBW/kg (Calculated) : 66.24  Temp (24hrs), Avg:98 F (36.7 C), Min:97.5 F (36.4 C), Max:98.3 F (36.8 C)   Recent Labs Lab 07/15/15 1639 07/16/15 0803 07/17/15 0450 07/19/15 0431 07/19/15 1233 07/21/15 0427 07/21/15 2150  WBC 9.7 10.8 8.8  --   --  9.5 14.5*  CREATININE 10.47* 9.44* 9.51*  --   --  9.89* 10.62*  VANCOTROUGH  --   --   --  22*  --   --   --   VANCORANDOM  --   --   --   --  19  --  16    Estimated Creatinine Clearance: 5.8 mL/min (by C-G formula based on Cr of 10.62).    Allergies  Allergen Reactions  . Cephalosporins Anaphylaxis  . Penicillins Anaphylaxis and Other (See Comments)    Has patient had a PCN reaction causing immediate rash, facial/tongue/throat swelling, SOB or lightheadedness with hypotension: Yes Has patient had a PCN reaction causing severe rash involving mucus membranes or skin necrosis: No Has patient had a PCN reaction that required hospitalization No Has patient had a PCN reaction occurring within the last 10 years: No If all of the above answers are "NO", then may proceed with Cephalosporin use.  [name] Phenergan [Promethazine Hcl] Nausea And Vomiting    Antimicrobials this admission: Levaquin  >> vancomycin and aztreonam   >>   Dose adjustments this admission:   Microbiology results: 4/16 BCx: NGTD 4/16 peritoneal fluid cx: NGTD 4/21 MRSA PCR: pending  Thank you for allowing pharmacy to be a part of this patient's  care.  Tjuana Vickrey S 07/22/2015 12:04 AM

## 2015-07-22 NOTE — Care Management Note (Signed)
Case Management Note  Patient Details  Name: Kimberly Montgomery MRN: [id number] Date of Birth: Jun 29, 1965  Subjective/Objective:     [facility] Transfer Coordinator Lelon Mast this morning and was inforned that Ms Dovel is currently still on the transfer wait list for [facility].                Action/Plan:   Expected Discharge Date:                  Expected Discharge Plan:     In-House Referral:     Discharge planning Services     Post Acute Care Choice:    Choice offered to:     DME Arranged:    DME Agency:     HH Arranged:    HH Agency:     Status of Service:     Medicare Important Message Given:  Yes Date Medicare IM Given:    Medicare IM give by:    Date Additional Medicare IM Given:    Additional Medicare Important Message give by:     If discussed at Long Length of Stay Meetings, dates discussed:    Additional Comments:  Alcee Sipos A, RN 07/22/2015, 9:09 AM

## 2015-07-22 NOTE — Progress Notes (Addendum)
Subjective: On yesterday patient with a witnessed generalized seizure.  Patient was loaded with Depakote but had another seizure overnight.  Was loaded with Keppra and has been stable since that time.    Objective: Current vital signs: BP 101/57 mmHg  Pulse 69  Temp(Src) 97.8 F (36.6 C) (Oral)  Resp 18  Ht 5' 9.02" (1.753 m)  Wt 131.9 kg (290 lb 12.6 oz)  BMI 42.92 kg/m2  SpO2 95%  LMP 03/16/2015 Vital signs in last 24 hours: Temp:  [97.8 F (36.6 C)-98.3 F (36.8 C)] 97.8 F (36.6 C) (04/22 0500) Pulse Rate:  [69-111] 69 (04/22 0700) Resp:  [12-20] 18 (04/22 0700) BP: (91-142)/(49-89) 101/57 mmHg (04/22 0700) SpO2:  [91 %-100 %] 95 % (04/22 0700) Weight:  [131.9 kg (290 lb 12.6 oz)] 131.9 kg (290 lb 12.6 oz) (04/22 0249)  Intake/Output from previous day: 04/21 0701 - 04/22 0700 In: 400 [IV Piggyback:400] Out: 1504  Intake/Output this shift:   Nutritional status: Diet - low sodium heart healthy Diet - low sodium heart healthy DIET SOFT Room service appropriate?: Yes; Fluid consistency:: Thin Diet - low sodium heart healthy  Neurologic Exam: Mental Status: Lethargic but arousable.  Able to follow commands.  Speech fluent Cranial Nerves: II: Visual fields grossly normal, pupils equal, round, reactive to light and accommodation III,IV, VI: ptosis not present, extra-ocular motions intact bilaterally V,VII: smile symmetric, facial light touch sensation normal bilaterally VIII: hearing normal bilaterally IX,X: gag reflex present XI: bilateral shoulder shrug XII: midline tongue extension Motor: Lifts all extremities off the bed with no focal weakness noted.      Lab Results: Basic Metabolic Panel:  Recent Labs Lab 07/15/15 1639 07/16/15 0803 07/17/15 0450 07/20/15 0547 07/21/15 0427 07/21/15 2150  NA 136  --  135  --  128* 126*  K 4.5  --  4.0  --  4.2 4.8  CL 100*  --  100*  --  91* 89*  CO2 20*  --  24  --  22 20*  GLUCOSE 155*  --  145*  --  157* 106*   BUN 92*  --  77*  --  62* 66*  CREATININE 10.47* 9.44* 9.51*  --  9.89* 10.62*  CALCIUM 6.2*  --  6.4*  --  5.9* 6.0*  MG  --   --   --   --  1.9  --   PHOS  --   --   --  11.7*  --   --     Liver Function Tests:  Recent Labs Lab 07/15/15 1639  AST 16  ALT 13*  ALKPHOS 102  BILITOT 0.6  PROT 5.8*  ALBUMIN 2.7*    Recent Labs Lab 07/15/15 1639  LIPASE 23   No results for input(s): AMMONIA in the last 168 hours.  CBC:  Recent Labs Lab 07/15/15 1639 07/16/15 0803 07/17/15 0450 07/21/15 0427 07/21/15 2150  WBC 9.7 10.8 8.8 9.5 14.5*  NEUTROABS  --   --   --   --  11.9*  HGB 10.7* 9.4* 9.4* 8.2* 8.1*  HCT 31.8* 29.0* 28.2* 24.5* 24.6*  MCV 83.8 83.8 84.0 84.1 83.0  PLT 352 322 289 250 302    Cardiac Enzymes:  Recent Labs Lab 07/15/15 2111 07/21/15 2150  TROPONINI 0.05* <0.03    Lipid Panel: No results for input(s): CHOL, TRIG, HDL, CHOLHDL, VLDL, LDLCALC in the last 168 hours.  CBG:  Recent Labs Lab 07/21/15 1202 07/21/15 1456 07/21/15 1641 07/21/15 2049 07/22/15  0837  GLUCAP 132* 185* 117* 104* 185*    Microbiology: Results for orders placed or performed during the hospital encounter of 07/15/15  Body fluid culture     Status: None   Collection Time: 07/15/15 10:23 PM  Result Value Ref Range Status   Specimen Description PERITONEAL  Final   Special Requests Normal  Final   Gram Stain RARE WBC SEEN NO ORGANISMS SEEN   Final   Culture No growth aerobically or anaerobically.  Final   Report Status 07/19/2015 FINAL  Final  Culture, blood (routine x 2)     Status: None   Collection Time: 07/16/15  3:58 AM  Result Value Ref Range Status   Specimen Description BLOOD RIGHT WRIST  Final   Special Requests   Final    BOTTLES DRAWN AEROBIC AND ANAEROBIC 12CCAERO,6CCANA   Culture NO GROWTH 5 DAYS  Final   Report Status 07/21/2015 FINAL  Final  Culture, blood (routine x 2)     Status: None   Collection Time: 07/16/15  3:58 AM  Result Value  Ref Range Status   Specimen Description BLOOD RIGHT FOREARM  Final   Special Requests   Final    BOTTLES DRAWN AEROBIC AND ANAEROBIC 12CCAERO,2CCANA   Culture NO GROWTH 5 DAYS  Final   Report Status 07/21/2015 FINAL  Final  MRSA PCR Screening     Status: None   Collection Time: 07/21/15 10:45 PM  Result Value Ref Range Status   MRSA by PCR NEGATIVE NEGATIVE Final    Comment:        The GeneXpert MRSA Assay (FDA approved for NASAL specimens only), is one component of a comprehensive MRSA colonization surveillance program. It is not intended to diagnose MRSA infection nor to guide or monitor treatment for MRSA infections.     Coagulation Studies: No results for input(s): LABPROT, INR in the last 72 hours.  Imaging: Ct Head Wo Contrast  07/21/2015  CLINICAL DATA:  Altered mental status. Noncommunicative and would not follow commands. Seizures earlier today. EXAM: CT HEAD WITHOUT CONTRAST TECHNIQUE: Contiguous axial images were obtained from the base of the skull through the vertex without intravenous contrast. COMPARISON:  07/19/2015 FINDINGS: Ventricles and sulci appear symmetrical. No ventricular dilatation. No mass effect or midline shift. No abnormal extra-axial fluid collections. Gray-white matter junctions are distinct. Basal cisterns are not effaced. No evidence of acute intracranial hemorrhage. No depressed skull fractures. Mild mucosal thickening in the paranasal sinuses. Mastoid air cells are not opacified. Vascular calcifications. Similar appearance to previous study. IMPRESSION: No acute intracranial abnormalities demonstrated. Electronically Signed   By: Burman Nieves M.D.   On: 07/21/2015 22:43    Medications:  I have reviewed the patient's current medications. Scheduled: . amLODipine  10 mg Oral Daily  . aspirin EC  81 mg Oral Daily  . calcium acetate  2,001 mg Oral TID WC  . calcium carbonate  1 tablet Oral TID WC  . carvedilol  6.25 mg Oral BID WC  .  clopidogrel  75 mg Oral Daily  . dialysis solution 2.5% low-MG/low-CA   Intraperitoneal Q24H  . dialysis solution 2.5% low-MG/low-CA   Intraperitoneal Q24H  . divalproex  500 mg Oral BID  . docusate sodium  100 mg Oral BID  . ezetimibe  10 mg Oral QHS  . gentamicin cream  1 application Topical Daily  . heparin  5,000 Units Subcutaneous 3 times per day  . insulin aspart  0-5 Units Subcutaneous QHS  . insulin aspart  0-9 Units Subcutaneous TID WC  . isosorbide mononitrate  60 mg Oral Daily  . [START ON 07/23/2015] levETIRAcetam  500 mg Oral Daily  . levofloxacin (LEVAQUIN) IV  750 mg Intravenous Once   Followed by  . [START ON 07/24/2015] levofloxacin (LEVAQUIN) IV  500 mg Intravenous Q48H  . lisinopril  40 mg Oral Daily  . LORazepam  0.5 mg Intravenous Once  . pantoprazole  40 mg Oral BID  . ranolazine  500 mg Oral BID  . sodium chloride flush  3 mL Intravenous Q12H    Assessment/Plan: Patient with seizure activity overnight.  EEG from 4/21 reviewed and was technically difficult but suggests the possibility of generalized epileptiform discharges.  Patient now on Depakote and Keppra.  No clinical seizure activity noted.  Metabolic abnormalities remain.  Recommendations: 1.  Agree with addressing metabolic abnormalities 2.  Continue Depakote and Keppra at current doses.   3.  Depakote level   LOS: 6 days   Thana Farr, MD Neurology [phone] 07/22/2015  9:32 AM

## 2015-07-22 NOTE — Progress Notes (Signed)
Pt. Eating and vomited during shift change, Brooke-RN gave PRN med.  Called Dr. Allena Katz discussed current diet of soft, changed to clear liquid. Will continue to monitor pt. closely

## 2015-07-22 NOTE — Progress Notes (Signed)
Pharmacy Antibiotic Note  Pharmacy is consulted to dose levofloxacin for peritonitis in this 50 year old female on peritoneal dialysis with history of anaphylaxis to penicillin and cephalosporins.   Patient previously on levofloxacin oral, has refused doses. Last dose given 4/19. Started on vancomycin, aztreonam, and metronidazole overnight. Per MD with discontinue these antibiotics and resume levofloxacin.  Plan: Ordered levofloxacin 750mg  IV x 1 followed by 500mg  IV Q48H. Will leave as IV for now as patient previously refused oral dose.   Height: 5' 9.02" (175.3 cm) Weight: 290 lb 12.6 oz (131.9 kg) IBW/kg (Calculated) : 66.24  Temp (24hrs), Avg:98.1 F (36.7 C), Min:97.8 F (36.6 C), Max:98.3 F (36.8 C)   Recent Labs Lab 07/15/15 1639 07/16/15 0803 07/17/15 0450 07/19/15 0431 07/19/15 1233 07/21/15 0427 07/21/15 2150  WBC 9.7 10.8 8.8  --   --  9.5 14.5*  CREATININE 10.47* 9.44* 9.51*  --   --  9.89* 10.62*  VANCOTROUGH  --   --   --  22*  --   --   --   VANCORANDOM  --   --   --   --  19  --  16    Estimated Creatinine Clearance: 9.2 mL/min (by C-G formula based on Cr of 10.62).    Allergies  Allergen Reactions  . Cephalosporins Anaphylaxis  . Penicillins Anaphylaxis and Other (See Comments)    Has patient had a PCN reaction causing immediate rash, facial/tongue/throat swelling, SOB or lightheadedness with hypotension: Yes Has patient had a PCN reaction causing severe rash involving mucus membranes or skin necrosis: No Has patient had a PCN reaction that required hospitalization No Has patient had a PCN reaction occurring within the last 10 years: No If all of the above answers are "NO", then may proceed with Cephalosporin use.  [name] Phenergan [Promethazine Hcl] Nausea And Vomiting    Antimicrobials this admission: levaquin 4/16 >> 4/19, resumed 4/22 Vanc/aztronam/metronidazole 4/21 >> 4/22   Microbiology results: 4/16 BCx: NGTD 4/16 peritoneal fluid cx:  NGTD 4/21 MRSA PCR: pending  Thank you for allowing pharmacy to be a part of this patient's care.  Kinza Gouveia C 07/22/2015 7:54 AM

## 2015-07-23 LAB — BASIC METABOLIC PANEL WITH GFR
Anion gap: 16 — ABNORMAL HIGH (ref 5–15)
BUN: 61 mg/dL — ABNORMAL HIGH (ref 6–20)
CO2: 23 mmol/L (ref 22–32)
Calcium: 6.1 mg/dL — CL (ref 8.9–10.3)
Chloride: 91 mmol/L — ABNORMAL LOW (ref 101–111)
Creatinine, Ser: 10.25 mg/dL — ABNORMAL HIGH (ref 0.44–1.00)
GFR calc Af Amer: 4 mL/min — ABNORMAL LOW
GFR calc non Af Amer: 4 mL/min — ABNORMAL LOW
Glucose, Bld: 118 mg/dL — ABNORMAL HIGH (ref 65–99)
Potassium: 3.9 mmol/L (ref 3.5–5.1)
Sodium: 130 mmol/L — ABNORMAL LOW (ref 135–145)

## 2015-07-23 LAB — CBC
HCT: 22.1 % — ABNORMAL LOW (ref 35.0–47.0)
Hemoglobin: 7.4 g/dL — ABNORMAL LOW (ref 12.0–16.0)
MCH: 27.6 pg (ref 26.0–34.0)
MCHC: 33.4 g/dL (ref 32.0–36.0)
MCV: 82.5 fL (ref 80.0–100.0)
Platelets: 263 K/uL (ref 150–440)
RBC: 2.68 MIL/uL — ABNORMAL LOW (ref 3.80–5.20)
RDW: 13.7 % (ref 11.5–14.5)
WBC: 13.7 K/uL — ABNORMAL HIGH (ref 3.6–11.0)

## 2015-07-23 LAB — GLUCOSE, CAPILLARY
GLUCOSE-CAPILLARY: 88 mg/dL (ref 65–99)
GLUCOSE-CAPILLARY: 133 mg/dL — AB (ref 65–99)
GLUCOSE-CAPILLARY: 206 mg/dL — AB (ref 65–99)
Glucose-Capillary: 135 mg/dL — ABNORMAL HIGH (ref 65–99)

## 2015-07-23 MED ORDER — LEVETIRACETAM 750 MG PO TABS
750.0000 mg | ORAL_TABLET | Freq: Two times a day (BID) | ORAL | Status: DC
  Administered 2015-07-23 – 2015-07-26 (×6): 750 mg via ORAL
  Filled 2015-07-23 (×8): qty 1

## 2015-07-23 MED ORDER — TUBERCULIN PPD 5 UNIT/0.1ML ID SOLN
5.0000 [IU] | Freq: Once | INTRADERMAL | Status: DC
  Filled 2015-07-23: qty 0.1

## 2015-07-23 MED ORDER — LEVETIRACETAM 250 MG PO TABS
250.0000 mg | ORAL_TABLET | Freq: Once | ORAL | Status: AC
  Administered 2015-07-23: 250 mg via ORAL
  Filled 2015-07-23: qty 1

## 2015-07-23 MED ORDER — CETYLPYRIDINIUM CHLORIDE 0.05 % MT LIQD
7.0000 mL | Freq: Two times a day (BID) | OROMUCOSAL | Status: DC
  Administered 2015-07-23 – 2015-07-31 (×8): 7 mL via OROMUCOSAL

## 2015-07-23 MED ORDER — DIVALPROEX SODIUM 500 MG PO DR TAB
1000.0000 mg | DELAYED_RELEASE_TABLET | Freq: Two times a day (BID) | ORAL | Status: DC
  Administered 2015-07-23 – 2015-07-26 (×7): 1000 mg via ORAL
  Filled 2015-07-23 (×6): qty 2
  Filled 2015-07-23: qty 4
  Filled 2015-07-23: qty 2

## 2015-07-23 MED ORDER — LEVETIRACETAM 750 MG PO TABS: 750.0000 mg | ORAL_TABLET | Freq: Every day | ORAL | Status: DC

## 2015-07-23 MED FILL — Medication: Qty: 1 | Status: AC

## 2015-07-23 NOTE — Progress Notes (Signed)
LCSW met with patient and family member ( Daughter)  Mamie Laurel [phone]. Patient and daughter requested that they needed assistance to get HCPOA- LCSW coordinated with ICU secretary to call the Chaplain to come and assist patient and daughter. LCSW collected information and will complete assessment and start FL2 but not put information via the [facility] for short term rehab at this time. Patient at this point is verbal and does not want to go to SNF because she will have to have a Hemo dialysis catheter put in. She wants to remain on PD dialysis. Daughter and patient are more agreeable to have PT come to house. Patient will await PT recommendations and then re-evaluate their option.  [employer] LCSW [phone]

## 2015-07-23 NOTE — Progress Notes (Signed)
Called Dr. Anne Hahn to discuss whether to start any maintenance fluid.  Pt.'s Na+ has been trending down, pt. Having nausea and vomiting, eating 25 % of meals during day. BUT pt. Is receiving PD and has hx of CHF. Dr. Anne Hahn ordered AM labs and wanted nephrology to make the decision r/t maintenance fluid as pt. Is receiving PD.  Will pass along to AM RN to discuss during rounds.

## 2015-07-23 NOTE — Progress Notes (Signed)
Subjective: Patient reports she has had some minimal jerking this morning but not as bad as before.  No further generalized seizures noted.    Objective: Current vital signs: BP 106/45 mmHg  Pulse 66  Temp(Src) 98.6 F (37 C) (Oral)  Resp 13  Ht 5' 9.02" (1.753 m)  Wt 127.6 kg (281 lb 4.9 oz)  BMI 41.52 kg/m2  SpO2 100%  LMP 03/16/2015 Vital signs in last 24 hours: Temp:  [98.3 F (36.8 C)-98.6 F (37 C)] 98.6 F (37 C) (04/23 0800) Pulse Rate:  [46-84] 66 (04/23 1000) Resp:  [10-19] 13 (04/23 1000) BP: (91-141)/(45-77) 106/45 mmHg (04/23 1000) SpO2:  [85 %-100 %] 100 % (04/23 1000) Weight:  [127.6 kg (281 lb 4.9 oz)] 127.6 kg (281 lb 4.9 oz) (04/23 0500)  Intake/Output from previous day: 04/22 0701 - 04/23 0700 In: 822 [P.O.:660; I.V.:12; IV Piggyback:150] Out: 129  Intake/Output this shift:   Nutritional status: Diet - low sodium heart healthy Diet - low sodium heart healthy Diet - low sodium heart healthy Diet clear liquid Room service appropriate?: Yes; Fluid consistency:: Thin  Neurologic Exam: Mental Status: Lethargic but arousable. Able to follow commands. Speech fluent Cranial Nerves: II: Visual fields grossly normal, pupils equal, round, reactive to light and accommodation III,IV, VI: ptosis not present, extra-ocular motions intact bilaterally V,VII: smile symmetric, facial light touch sensation normal bilaterally VIII: hearing normal bilaterally IX,X: gag reflex present XI: bilateral shoulder shrug XII: midline tongue extension Motor: Lifts all extremities off the bed with no focal weakness noted.    Lab Results: Basic Metabolic Panel:  Recent Labs Lab 07/17/15 0450 07/20/15 0547 07/21/15 0427 07/21/15 2150 07/23/15 0435  NA 135  --  128* 126* 130*  K 4.0  --  4.2 4.8 3.9  CL 100*  --  91* 89* 91*  CO2 24  --  22 20* 23  GLUCOSE 145*  --  157* 106* 118*  BUN 77*  --  62* 66* 61*  CREATININE 9.51*  --  9.89* 10.62* 10.25*  CALCIUM 6.4*   --  5.9* 6.0* 6.1*  MG  --   --  1.9  --   --   PHOS  --  11.7*  --   --   --     Liver Function Tests: No results for input(s): AST, ALT, ALKPHOS, BILITOT, PROT, ALBUMIN in the last 168 hours. No results for input(s): LIPASE, AMYLASE in the last 168 hours. No results for input(s): AMMONIA in the last 168 hours.  CBC:  Recent Labs Lab 07/17/15 0450 07/21/15 0427 07/21/15 2150 07/23/15 0435  WBC 8.8 9.5 14.5* 13.7*  NEUTROABS  --   --  11.9*  --   HGB 9.4* 8.2* 8.1* 7.4*  HCT 28.2* 24.5* 24.6* 22.1*  MCV 84.0 84.1 83.0 82.5  PLT 289 250 302 263    Cardiac Enzymes:  Recent Labs Lab 07/21/15 2150  TROPONINI <0.03    Lipid Panel: No results for input(s): CHOL, TRIG, HDL, CHOLHDL, VLDL, LDLCALC in the last 168 hours.  CBG:  Recent Labs Lab 07/22/15 0837 07/22/15 1117 07/22/15 1827 07/22/15 2154 07/23/15 0720  GLUCAP 185* 127* 71 104* 88    Microbiology: Results for orders placed or performed during the hospital encounter of 07/15/15  Body fluid culture     Status: None   Collection Time: 07/15/15 10:23 PM  Result Value Ref Range Status   Specimen Description PERITONEAL  Final   Special Requests Normal  Final   Gram  Stain RARE WBC SEEN NO ORGANISMS SEEN   Final   Culture No growth aerobically or anaerobically.  Final   Report Status 07/19/2015 FINAL  Final  Culture, blood (routine x 2)     Status: None   Collection Time: 07/16/15  3:58 AM  Result Value Ref Range Status   Specimen Description BLOOD RIGHT WRIST  Final   Special Requests   Final    BOTTLES DRAWN AEROBIC AND ANAEROBIC 12CCAERO,6CCANA   Culture NO GROWTH 5 DAYS  Final   Report Status 07/21/2015 FINAL  Final  Culture, blood (routine x 2)     Status: None   Collection Time: 07/16/15  3:58 AM  Result Value Ref Range Status   Specimen Description BLOOD RIGHT FOREARM  Final   Special Requests   Final    BOTTLES DRAWN AEROBIC AND ANAEROBIC 12CCAERO,2CCANA   Culture NO GROWTH 5 DAYS  Final    Report Status 07/21/2015 FINAL  Final  MRSA PCR Screening     Status: None   Collection Time: 07/21/15 10:45 PM  Result Value Ref Range Status   MRSA by PCR NEGATIVE NEGATIVE Final    Comment:        The GeneXpert MRSA Assay (FDA approved for NASAL specimens only), is one component of a comprehensive MRSA colonization surveillance program. It is not intended to diagnose MRSA infection nor to guide or monitor treatment for MRSA infections.     Coagulation Studies: No results for input(s): LABPROT, INR in the last 72 hours.  Imaging: Ct Head Wo Contrast  07/21/2015  CLINICAL DATA:  Altered mental status. Noncommunicative and would not follow commands. Seizures earlier today. EXAM: CT HEAD WITHOUT CONTRAST TECHNIQUE: Contiguous axial images were obtained from the base of the skull through the vertex without intravenous contrast. COMPARISON:  07/19/2015 FINDINGS: Ventricles and sulci appear symmetrical. No ventricular dilatation. No mass effect or midline shift. No abnormal extra-axial fluid collections. Gray-white matter junctions are distinct. Basal cisterns are not effaced. No evidence of acute intracranial hemorrhage. No depressed skull fractures. Mild mucosal thickening in the paranasal sinuses. Mastoid air cells are not opacified. Vascular calcifications. Similar appearance to previous study. IMPRESSION: No acute intracranial abnormalities demonstrated. Electronically Signed   By: Burman Nieves M.D.   On: 07/21/2015 22:43    Medications:  I have reviewed the patient's current medications. Scheduled: . amLODipine  10 mg Oral Daily  . aspirin EC  81 mg Oral Daily  . calcium acetate  2,001 mg Oral TID WC  . calcium carbonate  1 tablet Oral TID WC  . carvedilol  6.25 mg Oral BID WC  . clopidogrel  75 mg Oral Daily  . dialysis solution 2.5% low-MG/low-CA   Intraperitoneal Q24H  . dialysis solution 2.5% low-MG/low-CA   Intraperitoneal Q24H  . divalproex  1,000 mg Oral BID  .  docusate sodium  100 mg Oral BID  . ezetimibe  10 mg Oral QHS  . gentamicin cream  1 application Topical Daily  . heparin  5,000 Units Subcutaneous 3 times per day  . insulin aspart  0-5 Units Subcutaneous QHS  . insulin aspart  0-9 Units Subcutaneous TID WC  . isosorbide mononitrate  60 mg Oral Daily  . levETIRAcetam  250 mg Oral Once  . [START ON 07/24/2015] levETIRAcetam  750 mg Oral Daily  . [START ON 07/24/2015] levofloxacin (LEVAQUIN) IV  500 mg Intravenous Q48H  . lisinopril  40 mg Oral Daily  . pantoprazole  40 mg Oral BID  .  ranolazine  500 mg Oral BID  . sodium chloride flush  3 mL Intravenous Q12H    Assessment/Plan: Patient has noted some return of the jerks this morning.  Remains on Keppra and Depakote.  Valproic acid level of <10.    Recommendations: 1.  250mg  Keppra now with increase of maintenance to 750mg  BID 2.  Increase Depakote to 1000mg  BID 3.  Will continue to follow   LOS: 7 days   Thana Farr, MD Neurology [phone] 07/23/2015  10:37 AM

## 2015-07-23 NOTE — Clinical Social Work Note (Signed)
Clinical Social Work Assessment  Patient Details  Name: Kimberly Montgomery MRN: [id number] Date of Birth: 08-22-1965  Date of referral:  07/23/15               Reason for consult:  Facility Placement, Family Concerns                Permission sought to share information with:  Family Supports Permission granted to share information::  Yes, Verbal Permission Granted  Name::     Sharlot Gowda [phone]  Agency::  no  Relationship::  yes  Contact Information:  no  Housing/Transportation Living arrangements for the past 2 months:  Single Family Home Source of Information:  Patient, Adult Children Patient Interpreter Needed:  None Criminal Activity/Legal Involvement Pertinent to Current Situation/Hospitalization:  No - Comment as needed Significant Relationships:  Adult Children Lives with:  Self, Adult Children Do you feel safe going back to the place where you live?  Yes Need for family participation in patient care:  Yes (Comment)  Care giving concerns:  Daughter very upset of level of care her Mom received on 2nd floor. Nurses and techs were fine but daughter was never notified that Mom coded. Mom and patient want her to be HCPOA and LCSW called Chaplain to assist with this Process. Social Worker assessment / plan:   LCSW collected information to complete this assessment by daughter and patient. Verbal consent given to speak to daughter NOT TO A FACILITY. Patient is insulin dependant  ( Diabetic) She is on dialysis at home ( PD) and wishes to remain this way not hemo-dialysis. Pt consult pending. Patient and daughter would like care management to coordinate home PT and supports as required. Patient has [employer]. Patient is oriented x3. Currently patient reports she is very weak. Mom needs some assistance now with ADL's but on 08/22/22 she was able to ambulate but now they are not certain. Patient is FULL Code and is also followed by [facility] [employer] [facility].    Employment status:  Disabled (Comment on whether or not currently receiving Disability) (End stage renal) Insurance information:   (Humana/Medicare) PT Recommendations:  Not assessed at this time Information / Referral to community resources:   yes lists provided and Chaplain called for HCPOA documentation  Patient/Family's Response to care:  They report the ICU gives amazing care but concerned about 2nd floor at this time.  Patient/Family's Understanding of and Emotional Response to Diagnosis, Current Treatment, and Prognosis:  Patients daughter wants more medical information about options for her mothers condition, why did her Mother have seizures,what caused it, will she need anti- seizure medication for life? LCSW encouraged patient to consult with Endocrinologist or her [facility] doctors and  Or neuroligist.  Emotional Assessment Appearance:  Appears stated age Attitude/Demeanor/Rapport:  Reactive, Apprehensive Affect (typically observed):  Accepting, Apprehensive, Guarded Orientation:  Oriented to Self, Oriented to Place, Oriented to  Time, Oriented to Situation Alcohol / Substance use:  Tobacco Use Psych involvement (Current and /or in the community):  No (Comment)  Discharge Needs  Concerns to be addressed:  Adjustment to Illness, Care Coordination, Discharge Planning Concerns, Other (Comment Required (Patients family upset that they were not notified the patient coded last 2022-08-22 and has seizures, daughter very upset) Readmission within the last 30 days:  No Current discharge risk:  Dependent with Mobility Barriers to Discharge:  Family Issues, Continued Medical Work up, Other (Patient wants to remain PD dialysis and not Hemo and they were informed  no facility will acept her unless she gets a hemo catherter installed.)   Cheron Schaumann, LCSW 07/23/2015, 2:50 PM

## 2015-07-23 NOTE — NC FL2 (Signed)
[place] MEDICAID FL2 LEVEL OF CARE SCREENING TOOL     IDENTIFICATION  Patient Name: Kimberly Montgomery Birthdate: [birth year] Sex: female Admission Date (Current Location): 07/15/2015  [place] and [place] Number:  Chiropodist and Address:  [facility], [address] [place], [place] [address]      Provider Number: [id number]  Attending Physician Name and Address:  Enedina Finner, MD  Relative Name and Phone Number:       Current Level of Care: Hospital Recommended Level of Care: Skilled Nursing Facility, Other (Comment) Prior Approval Number:    Date Approved/Denied:   PASRR Number:   [id number]  Discharge Plan: Home    Current Diagnoses: Patient Active Problem List   Diagnosis Date Noted  . Peritonitis (HCC) 07/16/2015  . Unstable angina (HCC) 07/09/2015  . ESRD on peritoneal dialysis (HCC) 07/09/2015  . Accelerated hypertension 07/09/2015  . Type 2 diabetes mellitus (HCC) 07/09/2015  . CAD (coronary artery disease) 07/09/2015  . HLD (hyperlipidemia) 07/09/2015  . GERD (gastroesophageal reflux disease) 07/09/2015  . Chest pain 05/12/2015    Orientation RESPIRATION BLADDER Height & Weight     Self, Time, Situation, Place  O2 (4-5 litres) Continent Weight: 281 lb 4.9 oz (127.6 kg) Height:  5' 9.02" (175.3 cm)  BEHAVIORAL SYMPTOMS/MOOD NEUROLOGICAL BOWEL NUTRITION STATUS     (Recent siezures) Continent Diet (Diabetic)  AMBULATORY STATUS COMMUNICATION OF NEEDS Skin   Extensive Assist Verbally Other (Comment) (PD catheter)                       Personal Care Assistance Level of Assistance  Bathing, Feeding, Dressing, Total care Bathing Assistance: Maximum assistance Feeding assistance: Independent Dressing Assistance: Maximum assistance Total Care Assistance: Maximum assistance   Functional Limitations Info  Sight, Hearing, Speech Sight Info: Adequate Hearing Info: Adequate Speech Info: Adequate    SPECIAL CARE FACTORS  FREQUENCY                       Contractures      Additional Factors Info  Code Status Code Status Info: FULL code             Current Medications (07/23/2015):  This is the current hospital active medication list Current Facility-Administered Medications  Medication Dose Route Frequency Provider Last Rate Last Dose  . 0.9 %  sodium chloride infusion  250 mL Intravenous PRN Ihor Austin, MD      . acetaminophen (TYLENOL) tablet 650 mg  650 mg Oral Q6H PRN Ihor Austin, MD   650 mg at 07/20/15 2131   Or  . acetaminophen (TYLENOL) suppository 650 mg  650 mg Rectal Q6H PRN Pavan Pyreddy, MD      . albuterol (PROVENTIL) (2.5 MG/3ML) 0.083% nebulizer solution 2.5 mg  2.5 mg Nebulization Q6H PRN Pavan Pyreddy, MD      . amLODipine (NORVASC) tablet 10 mg  10 mg Oral Daily Pavan Pyreddy, MD   10 mg at 07/23/15 1057  . aspirin EC tablet 81 mg  81 mg Oral Daily Pavan Pyreddy, MD   81 mg at 07/23/15 1057  . calcium acetate (PHOSLO) capsule 2,001 mg  2,001 mg Oral TID WC Lamont Dowdy, MD   2,001 mg at 07/23/15 1227  . calcium carbonate (TUMS - dosed in mg elemental calcium) chewable tablet 200 mg of elemental calcium  1 tablet Oral TID WC Arnaldo Natal, MD   200 mg of elemental calcium at 07/23/15  1227  . carvedilol (COREG) tablet 6.25 mg  6.25 mg Oral BID WC Ihor Austin, MD   6.25 mg at 07/23/15 0815  . clopidogrel (PLAVIX) tablet 75 mg  75 mg Oral Daily Ihor Austin, MD   75 mg at 07/23/15 1056  . dialysis solution 2.5% low-MG/low-CA dianeal solution   Intraperitoneal Q24H Mosetta Pigeon, MD   6 L at 07/22/15 2025  . dialysis solution 2.5% low-MG/low-CA dianeal solution   Intraperitoneal Q24H Lamont Dowdy, MD      . divalproex (DEPAKOTE) DR tablet 1,000 mg  1,000 mg Oral BID Thana Farr, MD      . docusate sodium (COLACE) capsule 100 mg  100 mg Oral QHS PRN Ihor Austin, MD      . docusate sodium (COLACE) capsule 100 mg  100 mg Oral BID Enedina Finner, MD   100 mg at  07/23/15 1054  . ezetimibe (ZETIA) tablet 10 mg  10 mg Oral QHS Ihor Austin, MD   10 mg at 07/22/15 2330  . gentamicin cream (GARAMYCIN) 0.1 % 1 application  1 application Topical Daily Mosetta Pigeon, MD   1 application at 07/23/15 1100  . heparin 1000 unit/ml injection 500 Units  500 Units Intraperitoneal PRN Mosetta Pigeon, MD      . heparin injection 5,000 Units  5,000 Units Subcutaneous 3 times per day Ihor Austin, MD   5,000 Units at 07/23/15 1400  . insulin aspart (novoLOG) injection 0-5 Units  0-5 Units Subcutaneous QHS Ramonita Lab, MD   2 Units at 07/20/15 2131  . insulin aspart (novoLOG) injection 0-9 Units  0-9 Units Subcutaneous TID WC Ramonita Lab, MD   1 Units at 07/23/15 1227  . isosorbide mononitrate (IMDUR) 24 hr tablet 60 mg  60 mg Oral Daily Pavan Pyreddy, MD   60 mg at 07/23/15 1056  . [START ON 07/24/2015] levETIRAcetam (KEPPRA) tablet 750 mg  750 mg Oral Daily Thana Farr, MD      . Melene Muller ON 07/24/2015] levofloxacin (LEVAQUIN) IVPB 500 mg  500 mg Intravenous Q48H Enedina Finner, MD      . lisinopril (PRINIVIL,ZESTRIL) tablet 40 mg  40 mg Oral Daily Ihor Austin, MD   40 mg at 07/23/15 1057  . LORazepam (ATIVAN) injection 1 mg  1 mg Intravenous Q2H PRN Srikar Sudini, MD      . ondansetron (ZOFRAN) tablet 2 mg  2 mg Oral Q6H PRN Enedina Finner, MD       Or  . ondansetron (ZOFRAN) injection 2 mg  2 mg Intravenous Q6H PRN Enedina Finner, MD   2 mg at 07/22/15 2102  . oxyCODONE-acetaminophen (PERCOCET/ROXICET) 5-325 MG per tablet 1-2 tablet  1-2 tablet Oral Q6H PRN Enedina Finner, MD   2 tablet at 07/22/15 2338  . pantoprazole (PROTONIX) EC tablet 40 mg  40 mg Oral BID Wallace Cullens, MD   40 mg at 07/23/15 0817  . polyethylene glycol (MIRALAX / GLYCOLAX) packet 17 g  17 g Oral Daily PRN Pavan Pyreddy, MD      . prochlorperazine (COMPAZINE) injection 5 mg  5 mg Intravenous Q6H PRN Ramonita Lab, MD   5 mg at 07/22/15 1904  . ranolazine (RANEXA) 12 hr tablet 500 mg  500 mg Oral BID Ihor Austin, MD    500 mg at 07/23/15 1056  . sodium chloride flush (NS) 0.9 % injection 3 mL  3 mL Intravenous Q12H Pavan Pyreddy, MD   3 mL at 07/23/15 1100  . sodium chloride flush (NS) 0.9 %  injection 3 mL  3 mL Intravenous PRN Ihor Austin, MD   3 mL at 07/19/15 0206  . tuberculin injection 5 Units  5 Units Intradermal Once Enedina Finner, MD   5 Units at 07/23/15 1509     Discharge Medications: Please see discharge summary for a list of discharge medications.  Relevant Imaging Results:  Relevant Lab Results:   Additional Information SSN # [id number]  Cheron Schaumann, [place]

## 2015-07-23 NOTE — Progress Notes (Signed)
Subjective:   Seen and examined on peritoneal dialysis. Tolerating treatment well.    Effluent pinkish. Cell count yesterday without much blood.   Son at bedside.   Patient alert and oriented. Continues to complain of jerking motions. Denies any more seizures.   Objective:  Vital signs in last 24 hours:  Temp:  [98.3 F (36.8 C)-98.6 F (37 C)] 98.3 F (36.8 C) (04/23 0341) Pulse Rate:  [46-84] 65 (04/23 0600) Resp:  [10-19] 10 (04/23 0600) BP: (91-141)/(49-77) 101/52 mmHg (04/23 0600) SpO2:  [85 %-100 %] 98 % (04/23 0600) Weight:  [127.6 kg (281 lb 4.9 oz)] 127.6 kg (281 lb 4.9 oz) (04/23 0500)  Weight change: 68.95 kg (152 lb 0.1 oz) Filed Weights   07/21/15 0900 07/22/15 0249 07/23/15 0500  Weight: 58.65 kg (129 lb 4.8 oz) 131.9 kg (290 lb 12.6 oz) 127.6 kg (281 lb 4.9 oz)    Intake/Output:    Intake/Output Summary (Last 24 hours) at 07/23/15 0835 Last data filed at 07/22/15 2200  Gross per 24 hour  Intake    472 ml  Output    129 ml  Net    343 ml     Physical Exam: General: NAD  HEENT Anicteric, moist oral mucous membranes  Neck supple  Pulm/lungs Normal effort, clear to auscultation bilaterally  CVS/Heart No rub or gallop  Abdomen:  Soft, nontender  Extremities: No peripheral edema, right BKA  Neurologic: Alert, oriented, speech normal, lethargic  Skin: No acute rashes  Access: PD catheter, exit site nontender and clean       Basic Metabolic Panel:   Recent Labs Lab 07/17/15 0450 07/20/15 0547 07/21/15 0427 07/21/15 2150 07/23/15 0435  NA 135  --  128* 126* 130*  K 4.0  --  4.2 4.8 3.9  CL 100*  --  91* 89* 91*  CO2 24  --  22 20* 23  GLUCOSE 145*  --  157* 106* 118*  BUN 77*  --  62* 66* 61*  CREATININE 9.51*  --  9.89* 10.62* 10.25*  CALCIUM 6.4*  --  5.9* 6.0* 6.1*  MG  --   --  1.9  --   --   PHOS  --  11.7*  --   --   --      CBC:  Recent Labs Lab 07/17/15 0450 07/21/15 0427 07/21/15 2150 07/23/15 0435  WBC 8.8 9.5  14.5* 13.7*  NEUTROABS  --   --  11.9*  --   HGB 9.4* 8.2* 8.1* 7.4*  HCT 28.2* 24.5* 24.6* 22.1*  MCV 84.0 84.1 83.0 82.5  PLT 289 250 302 263      Microbiology:  Recent Results (from the past 720 hour(s))  Body fluid culture     Status: None   Collection Time: 07/15/15 10:23 PM  Result Value Ref Range Status   Specimen Description PERITONEAL  Final   Special Requests Normal  Final   Gram Stain RARE WBC SEEN NO ORGANISMS SEEN   Final   Culture No growth aerobically or anaerobically.  Final   Report Status 07/19/2015 FINAL  Final  Culture, blood (routine x 2)     Status: None   Collection Time: 07/16/15  3:58 AM  Result Value Ref Range Status   Specimen Description BLOOD RIGHT WRIST  Final   Special Requests   Final    BOTTLES DRAWN AEROBIC AND ANAEROBIC 12CCAERO,6CCANA   Culture NO GROWTH 5 DAYS  Final   Report Status 07/21/2015 FINAL  Final  Culture, blood (routine x 2)     Status: None   Collection Time: 07/16/15  3:58 AM  Result Value Ref Range Status   Specimen Description BLOOD RIGHT FOREARM  Final   Special Requests   Final    BOTTLES DRAWN AEROBIC AND ANAEROBIC 12CCAERO,2CCANA   Culture NO GROWTH 5 DAYS  Final   Report Status 07/21/2015 FINAL  Final  MRSA PCR Screening     Status: None   Collection Time: 07/21/15 10:45 PM  Result Value Ref Range Status   MRSA by PCR NEGATIVE NEGATIVE Final    Comment:        The GeneXpert MRSA Assay (FDA approved for NASAL specimens only), is one component of a comprehensive MRSA colonization surveillance program. It is not intended to diagnose MRSA infection nor to guide or monitor treatment for MRSA infections.     Coagulation Studies: No results for input(s): LABPROT, INR in the last 72 hours.  Urinalysis: No results for input(s): COLORURINE, LABSPEC, PHURINE, GLUCOSEU, HGBUR, BILIRUBINUR, KETONESUR, PROTEINUR, UROBILINOGEN, NITRITE, LEUKOCYTESUR in the last 72 hours.  Invalid input(s): APPERANCEUR     Imaging: Ct Head Wo Contrast  07/21/2015  CLINICAL DATA:  Altered mental status. Noncommunicative and would not follow commands. Seizures earlier today. EXAM: CT HEAD WITHOUT CONTRAST TECHNIQUE: Contiguous axial images were obtained from the base of the skull through the vertex without intravenous contrast. COMPARISON:  07/19/2015 FINDINGS: Ventricles and sulci appear symmetrical. No ventricular dilatation. No mass effect or midline shift. No abnormal extra-axial fluid collections. Gray-white matter junctions are distinct. Basal cisterns are not effaced. No evidence of acute intracranial hemorrhage. No depressed skull fractures. Mild mucosal thickening in the paranasal sinuses. Mastoid air cells are not opacified. Vascular calcifications. Similar appearance to previous study. IMPRESSION: No acute intracranial abnormalities demonstrated. Electronically Signed   By: Burman Nieves M.D.   On: 07/21/2015 22:43     Medications:     . amLODipine  10 mg Oral Daily  . aspirin EC  81 mg Oral Daily  . calcium acetate  2,001 mg Oral TID WC  . calcium carbonate  1 tablet Oral TID WC  . carvedilol  6.25 mg Oral BID WC  . clopidogrel  75 mg Oral Daily  . dialysis solution 2.5% low-MG/low-CA   Intraperitoneal Q24H  . dialysis solution 2.5% low-MG/low-CA   Intraperitoneal Q24H  . divalproex  500 mg Oral BID  . docusate sodium  100 mg Oral BID  . ezetimibe  10 mg Oral QHS  . gentamicin cream  1 application Topical Daily  . heparin  5,000 Units Subcutaneous 3 times per day  . insulin aspart  0-5 Units Subcutaneous QHS  . insulin aspart  0-9 Units Subcutaneous TID WC  . isosorbide mononitrate  60 mg Oral Daily  . levETIRAcetam  500 mg Oral Daily  . [START ON 07/24/2015] levofloxacin (LEVAQUIN) IV  500 mg Intravenous Q48H  . lisinopril  40 mg Oral Daily  . pantoprazole  40 mg Oral BID  . ranolazine  500 mg Oral BID  . sodium chloride flush  3 mL Intravenous Q12H   sodium chloride, acetaminophen  **OR** acetaminophen, albuterol, docusate sodium, heparin, LORazepam, ondansetron **OR** ondansetron (ZOFRAN) IV, oxyCODONE-acetaminophen, polyethylene glycol, prochlorperazine, sodium chloride flush  Assessment/ Plan:  50 y.o. white  female with past medical history of end-stage renal disease on peritoneal dialysis, asthma, gastroparesis, diabetes mellitus, congestive heart failure, hypertension, endometriosis, coronary atherosclerosis, medical management  [facility]/[address]/peritoneal dialysis  1. End-stage  renal disease on peritoneal dialysis. Home PD prescription of 3 exchanges 8 hours with 2.2 litre fills. 2.5% dextrose - nightly PD  2. Acute peritonitis in PD patient. WBC cell count 690 with 73% neutrophils on admission.  - PD fluid culture is no growth - Empiric treatment with broad-spectrum antibiotics vancomycin and Levaquin. Vanco last given on 4/22.   3. Anemia chronic kidney disease. - epo as outpatient. Holding now with neurologic changes.   4. Secondary hyperparathyroidism. phos elevated at 11.7 on 4/20. - calcium acetate with meals.      LOS: 7 Jefte Carithers 4/23/20178:35 AM

## 2015-07-23 NOTE — Progress Notes (Signed)
Pharmacy Antibiotic Note  Pharmacy is consulted to dose levofloxacin for peritonitis in this 50 year old female on peritoneal dialysis with history of anaphylaxis to penicillin and cephalosporins.   Patient previously on levofloxacin oral, has refused doses. Last dose given 4/19. Started on vancomycin, aztreonam, and metronidazole overnight. Per MD with discontinue these antibiotics and resume levofloxacin.  Plan: Will continue current orders for levofloxacin 500mg  IV Q48H. Will leave as IV for now as patient previously refused oral dose.   Height: 5' 9.02" (175.3 cm) Weight: 281 lb 4.9 oz (127.6 kg) IBW/kg (Calculated) : 66.24  Temp (24hrs), Avg:98.5 F (36.9 C), Min:98.3 F (36.8 C), Max:98.6 F (37 C)   Recent Labs Lab 07/17/15 0450 07/19/15 0431 07/19/15 1233 07/21/15 0427 07/21/15 2150 07/23/15 0435  WBC 8.8  --   --  9.5 14.5* 13.7*  CREATININE 9.51*  --   --  9.89* 10.62* 10.25*  VANCOTROUGH  --  22*  --   --   --   --   VANCORANDOM  --   --  19  --  16  --     Estimated Creatinine Clearance: 9.3 mL/min (by C-G formula based on Cr of 10.25).    Allergies  Allergen Reactions  . Cephalosporins Anaphylaxis  . Penicillins Anaphylaxis and Other (See Comments)    Has patient had a PCN reaction causing immediate rash, facial/tongue/throat swelling, SOB or lightheadedness with hypotension: Yes Has patient had a PCN reaction causing severe rash involving mucus membranes or skin necrosis: No Has patient had a PCN reaction that required hospitalization No Has patient had a PCN reaction occurring within the last 10 years: No If all of the above answers are "NO", then may proceed with Cephalosporin use.  [name] Phenergan [Promethazine Hcl] Nausea And Vomiting    Antimicrobials this admission: levaquin 4/16 >> 4/19, resumed 4/22 Vanc/aztronam/metronidazole 4/21 >> 4/22   Microbiology results: 4/16 BCx: NGTD 4/16 peritoneal fluid cx: NGTD 4/21 MRSA PCR: pending  Thank you  for allowing pharmacy to be a part of this patient's care.  Itzia Cunliffe C 07/23/2015 8:49 AM

## 2015-07-23 NOTE — Clinical Social Work Note (Deleted)
Clinical Social Work Assessment  Patient Details  Name: Kimberly Montgomery MRN: [id number] Date of Birth: Mar 12, 1966  Date of referral:  07/23/15               Reason for consult:  Facility Placement, Family Concerns                Permission sought to share information with:  Family Supports Permission granted to share information::  Yes, Verbal Permission Granted  Name::     Sharlot Gowda [phone]  Agency::  no  Relationship::  yes  Contact Information:  no  Housing/Transportation Living arrangements for the past 2 months:  Single Family Home Source of Information:  Patient, Adult Children Patient Interpreter Needed:  None Criminal Activity/Legal Involvement Pertinent to Current Situation/Hospitalization:  No - Comment as needed Significant Relationships:  Adult Children Lives with:  Self, Adult Children Do you feel safe going back to the place where you live?  Yes Need for family participation in patient care:  Yes (Comment)  Care giving concerns:  Daughter very upset of level of care her Mom received on 2nd floor. Nurses and techs were fine but daughter was never notified that Mom coded. Mom and patient want her to be HCPOA and LCSW called Chaplain to assist with this Process. Social Worker assessment / plan:   LCSW collected information to complete this assessment by daughter and patient. Verbal consent given to speak to daughter NOT TO A FACILITY. Patient is insulin dependant  ( Diabetic) She is on dialysis at home ( PD) and wishes to remain this way not hemo-dialysis. Pt consult pending. Patient and daughter would like care management to coordinate home PT and supports as required. Patient has [employer]. Patient is oriented x3. Currently patient reports she is very weak. Mom needs some assistance now with ADL's but on 08-14-2022 she was able to ambulate but now they are not certain. Patient is FULL Code and is also followed by [employer] [facility].    Employment status:  Disabled (Comment on whether or not currently receiving Disability) (End stage renal) Insurance information:   (Humana/Medicare) PT Recommendations:  Not assessed at this time Information / Referral to community resources:   yes lists provided and Chaplain called for HCPOA documentation  Patient/Family's Response to care:  They report the ICU gives amazing care but concerned about 2nd floor at this time.  Patient/Family's Understanding of and Emotional Response to Diagnosis, Current Treatment, and Prognosis:  Patients daughter wants more medical information about options for her mothers condition, why did her Mother have seizures,what caused it, will she need anti- seizure medication for life? LCSW encouraged patient to consult with Endocrinologist or her [facility] doctors and  Or neuroligist.  Emotional Assessment Appearance:  Appears stated age Attitude/Demeanor/Rapport:  Reactive, Apprehensive Affect (typically observed):  Accepting, Apprehensive, Guarded Orientation:  Oriented to Self, Oriented to Place, Oriented to  Time, Oriented to Situation Alcohol / Substance use:  Tobacco Use Psych involvement (Current and /or in the community):  No (Comment)  Discharge Needs  Concerns to be addressed:  Adjustment to Illness, Care Coordination, Discharge Planning Concerns, Other (Comment Required (Patients family upset that they were not notified the patient coded last 2022-08-14 and has seizures, daughter very upset) Readmission within the last 30 days:  No Current discharge risk:  Dependent with Mobility Barriers to Discharge:  Family Issues, Continued Medical Work up, Other (Patient wants to remain PD dialysis and not Hemo and they were informed  no facility will acept her unless she gets a hemo catherter installed.)   Cheron Schaumann, LCSW 07/23/2015, 3:06 PM

## 2015-07-23 NOTE — Care Management Note (Signed)
Case Management Note  Patient Details  Name: ZAHARI TSOSIE MRN: [id number] Date of Birth: 01-Jul-1965  Subjective/Objective:    Call to [facility] Rosann Auerbach today who reports that Ms Cola is still on the [facility] transfer list. No timeline for transfer at this time.                 Action/Plan:   Expected Discharge Date:                  Expected Discharge Plan:     In-House Referral:     Discharge planning Services     Post Acute Care Choice:    Choice offered to:     DME Arranged:    DME Agency:     HH Arranged:    HH Agency:     Status of Service:     Medicare Important Message Given:  Yes Date Medicare IM Given:    Medicare IM give by:    Date Additional Medicare IM Given:    Additional Medicare Important Message give by:     If discussed at Long Length of Stay Meetings, dates discussed:    Additional Comments:  Annie Roseboom A, RN 07/23/2015, 9:27 AM

## 2015-07-23 NOTE — Consult Note (Signed)
Patient requested information on Advanced Directives/HCPOA. Chaplain offered/reviewed forms, instructions given on completion process and offered to attempt to find notary, if available on Sunday.  Explained that may need to wait until Monday to complete.  Pt will let nursing know if she is ready or has further questions.

## 2015-07-23 NOTE — Progress Notes (Signed)
Patient ID: Kimberly Montgomery, female   DOB: 08-07-1965, 50 y.o.   MRN: [id number] [facility] [place] at [facility]   PATIENT NAME: Kimberly Montgomery    MR#:  [id number]  DATE OF BIRTH:  03-06-1966  SUBJECTIVE:  Doing well. No seizures over 24 hours  REVIEW OF SYSTEMS:   Review of Systems  Constitutional: Negative for fever, chills and weight loss.  HENT: Negative for ear discharge, ear pain and nosebleeds.   Eyes: Negative for blurred vision, pain and discharge.  Respiratory: Negative for sputum production, shortness of breath, wheezing and stridor.   Cardiovascular: Negative for chest pain, palpitations, orthopnea and PND.  Gastrointestinal: Positive for nausea. Negative for vomiting, abdominal pain and diarrhea.  Genitourinary: Negative for urgency and frequency.  Musculoskeletal: Negative for back pain and joint pain.  Neurological: Positive for weakness. Negative for sensory change, speech change, focal weakness and seizures.  Psychiatric/Behavioral: Negative for depression and hallucinations. The patient is not nervous/anxious.    Tolerating Diet:yes on CLD Tolerating PT: rec rehab  DRUG ALLERGIES:   Allergies  Allergen Reactions  . Cephalosporins Anaphylaxis  . Penicillins Anaphylaxis and Other (See Comments)    Has patient had a PCN reaction causing immediate rash, facial/tongue/throat swelling, SOB or lightheadedness with hypotension: Yes Has patient had a PCN reaction causing severe rash involving mucus membranes or skin necrosis: No Has patient had a PCN reaction that required hospitalization No Has patient had a PCN reaction occurring within the last 10 years: No If all of the above answers are "NO", then may proceed with Cephalosporin use.  [name] Phenergan [Promethazine Hcl] Nausea And Vomiting    VITALS:  Blood pressure 101/52, pulse 65, temperature 98.3 F (36.8 C), temperature source Oral, resp. rate 10, height 5' 9.02" (1.753 m), weight 127.6  kg (281 lb 4.9 oz), last menstrual period 03/16/2015, SpO2 98 %.  PHYSICAL EXAMINATION:   Physical Exam  GENERAL:  50 y.o.-year-old patient lying in the bed with no acute distress. obese EYES: Pupils equal, round, reactive to light and accommodation. No scleral icterus. Extraocular muscles intact.  HEENT: Head atraumatic, normocephalic. Oropharynx and nasopharynx clear.  NECK:  Supple, no jugular venous distention. No thyroid enlargement, no tenderness.  LUNGS: Normal breath sounds bilaterally, no wheezing, rales, rhonchi. No use of accessory muscles of respiration.  CARDIOVASCULAR: S1, S2 normal. No murmurs, rubs, or gallops.  ABDOMEN: Soft, nontender, nondistended. Bowel sounds present. No organomegaly or mass.  EXTREMITIES: No cyanosis, clubbing or edema b/l.   Right prosthetic leg NEUROLOGIC: Cranial nerves II through XII are intact. No focal Motor or sensory deficits b/l.   PSYCHIATRIC:  patient is alert and oriented x 3.  SKIN: No obvious rash, lesion, or ulcer.   LABORATORY PANEL:  CBC  Recent Labs Lab 07/23/15 0435  WBC 13.7*  HGB 7.4*  HCT 22.1*  PLT 263    Chemistries   Recent Labs Lab 07/21/15 0427  07/23/15 0435  NA 128*  < > 130*  K 4.2  < > 3.9  CL 91*  < > 91*  CO2 22  < > 23  GLUCOSE 157*  < > 118*  BUN 62*  < > 61*  CREATININE 9.89*  < > 10.25*  CALCIUM 5.9*  < > 6.1*  MG 1.9  --   --   < > = values in this interval not displayed. Cardiac Enzymes  Recent Labs Lab 07/21/15 2150  TROPONINI <0.03   RADIOLOGY:  Ct Head Wo Contrast  07/21/2015  CLINICAL DATA:  Altered mental status. Noncommunicative and would not follow commands. Seizures earlier today. EXAM: CT HEAD WITHOUT CONTRAST TECHNIQUE: Contiguous axial images were obtained from the base of the skull through the vertex without intravenous contrast. COMPARISON:  07/19/2015 FINDINGS: Ventricles and sulci appear symmetrical. No ventricular dilatation. No mass effect or midline shift. No abnormal  extra-axial fluid collections. Gray-white matter junctions are distinct. Basal cisterns are not effaced. No evidence of acute intracranial hemorrhage. No depressed skull fractures. Mild mucosal thickening in the paranasal sinuses. Mastoid air cells are not opacified. Vascular calcifications. Similar appearance to previous study. IMPRESSION: No acute intracranial abnormalities demonstrated. Electronically Signed   By: Kimberly Montgomery M.D.   On: 07/21/2015 22:43   ASSESSMENT AND PLAN:  50 year old female patient with history of end-stage renal disease on peritoneal dialysis, hypertension, coronary artery disease, congestive heart failure presented to the emergency room with nausea and vomiting and abdominal discomfort.  #Generalized tonic-clonic seizures new onset started on 07/21/2015 Patient transferred to ICU for close monitoring She was loaded with IV Depakote thousand milligrams on 07/21/2015--- continue Depakote 500 mg twice a day per neurology recommendation Patient was given loading dose of IV Keppra thousand milligrams during the nighttime with recurrent seizures and now on keppra 500 mg daily EEG per Dr. Thad Ranger shows metabolic changes Repeat CT head 4/ 21 negative -This was discussed with an ICU attending Dr. Orson Aloe at [facility]. Patient is on the transfer list for [facility] -Patient received broad-spectrum antibiotics in the form of IV vancomycin, Flagyl, IV aztreonam last night. -Apparently she currently doesn't have any source of infection I'll hold off on these antibiotics and continue her Levaquin for acute peritonitis.  # Acute peritonitis with nausea vomiting and diffuse abdominal pain. Peritoneal fluid with neutrophils greater than 70% Continue Levofloxacin 500 mg QOD (total 10 days from 07/15/15) No need for vanc per nephrology. -repeat PD fluid clearing. No fever. PD fluid cx neg Provided symptomatic treatment with anti-emetics and pain medications as needed  #. End-stage renal  disease on peritoneal dialysis Continue peritoneal dialysis as recommended by nephrology. discussed with Dr. Wynelle Link Patient declining to convert to hemodialysis  # Gastroparesis  PPI BID  Tolerating po diet well. Erythromycin discontinued  #. Hypertension Continue Coreg and Norvasc as tolerated  # Congestive heart failure, chronic, diastolic - continue home medication aspirin 81 mg, Plavix 75 mg, Coreg and amlodipine. - Continue statin   #DVT prophylaxis subcutaneous heparin.  # Myoclonic jerks suspected due to gabapentin which was discontinued by Dr. Thad Ranger from neurology -When necessary low-dose Ativan if needed. -No further workup at this time -CT head is negative  Case discussed with Care Management/Social Worker. Management plans discussed with the patient, family and they are in agreement.  CODE STATUS:full   DVT Prophylaxis: heparin TOTAL TIME TAKING CARE OF THIS PATIENT: 30 minutes.  >50% time spent on counselling and coordination of care  Waiting to be transferred to [facility]  Note: This dictation was prepared with Dragon dictation along with smaller phrase technology. Any transcriptional errors that result from this process are unintentional.  Harlyn Italiano M.D on 07/23/2015 at 7:47 AM  Between 7am to 6pm - Pager - [phone]  After 6pm go to [email or web address] - password [email or web address] [facility]  [place] [facility]  Office  [phone]  CC: Primary care physician; Jarome Matin, MD

## 2015-07-24 ENCOUNTER — Inpatient Hospital Stay: Payer: Medicare HMO

## 2015-07-24 LAB — HEPATITIS A ANTIBODY, TOTAL: Hep A Total Ab: NEGATIVE

## 2015-07-24 LAB — GLUCOSE, CAPILLARY
GLUCOSE-CAPILLARY: 111 mg/dL — AB (ref 65–99)
GLUCOSE-CAPILLARY: 128 mg/dL — AB (ref 65–99)
GLUCOSE-CAPILLARY: 136 mg/dL — AB (ref 65–99)
Glucose-Capillary: 104 mg/dL — ABNORMAL HIGH (ref 65–99)

## 2015-07-24 MED ORDER — ERYTHROMYCIN BASE 250 MG PO TBEC
250.0000 mg | DELAYED_RELEASE_TABLET | Freq: Two times a day (BID) | ORAL | Status: DC
  Filled 2015-07-24 (×2): qty 1

## 2015-07-24 MED ORDER — SULFAMETHOXAZOLE-TRIMETHOPRIM 800-160 MG PO TABS
1.0000 | ORAL_TABLET | Freq: Once | ORAL | Status: AC
  Administered 2015-07-24: 1 via ORAL
  Filled 2015-07-24 (×2): qty 1

## 2015-07-24 MED ORDER — SULFAMETHOXAZOLE-TRIMETHOPRIM 800-160 MG PO TABS
1.0000 | ORAL_TABLET | ORAL | Status: DC
  Administered 2015-07-26: 1 via ORAL
  Filled 2015-07-24 (×2): qty 1

## 2015-07-24 MED ORDER — POLYETHYLENE GLYCOL 3350 17 G PO PACK
17.0000 g | PACK | Freq: Every day | ORAL | Status: DC
  Administered 2015-07-24 – 2015-08-01 (×7): 17 g via ORAL
  Filled 2015-07-24 (×8): qty 1

## 2015-07-24 MED ORDER — ERYTHROMYCIN BASE 250 MG PO TBEC
250.0000 mg | DELAYED_RELEASE_TABLET | Freq: Three times a day (TID) | ORAL | Status: DC
  Administered 2015-07-24 – 2015-07-27 (×12): 250 mg via ORAL
  Filled 2015-07-24 (×23): qty 1

## 2015-07-24 MED ORDER — DOCUSATE SODIUM 100 MG PO CAPS
100.0000 mg | ORAL_CAPSULE | Freq: Two times a day (BID) | ORAL | Status: DC
  Administered 2015-07-24 – 2015-08-01 (×10): 100 mg via ORAL
  Filled 2015-07-24 (×10): qty 1

## 2015-07-24 NOTE — Progress Notes (Signed)
To xray via bed 

## 2015-07-24 NOTE — Progress Notes (Signed)
Gave report to [place], Charity fundraiser on 2A.  Pt. On PD currently, VSS, no complications at this time. Called daughter to inform her of the change in room.  Daughter very upset and decided to come to the hospital to stay overnight as daughter felt the floor did not take care of mother prior. Also daughter stated that MD, nurse and LICSW during day had assured her that this pt. Would not be moving. I spoke with Hiral-charge RN who passed along info to nursing supervisor.

## 2015-07-24 NOTE — Progress Notes (Signed)
Pharmacy Antibiotic Note  Kimberly Montgomery is a 50 y.o. female admitted on 07/15/2015 with cellulitis.  Pharmacy has been consulted for Septra dosing.  Plan: Patient with ESRD requiring PD. The dose of Septra will be adjusted to 1 DS tablet po q48h based on renal function.  Height: 5' 9.02" (175.3 cm) Weight: 289 lb 11 oz (131.4 kg) IBW/kg (Calculated) : 66.24  Temp (24hrs), Avg:98.3 F (36.8 C), Min:98 F (36.7 C), Max:98.6 F (37 C)   Recent Labs Lab 07/19/15 0431 07/19/15 1233 07/21/15 0427 07/21/15 2150 07/23/15 0435  WBC  --   --  9.5 14.5* 13.7*  CREATININE  --   --  9.89* 10.62* 10.25*  VANCOTROUGH 22*  --   --   --   --   VANCORANDOM  --  19  --  16  --     Estimated Creatinine Clearance: 9.5 mL/min (by C-G formula based on Cr of 10.25).    Allergies  Allergen Reactions  . Cephalosporins Anaphylaxis  . Penicillins Anaphylaxis and Other (See Comments)    Has patient had a PCN reaction causing immediate rash, facial/tongue/throat swelling, SOB or lightheadedness with hypotension: Yes Has patient had a PCN reaction causing severe rash involving mucus membranes or skin necrosis: No Has patient had a PCN reaction that required hospitalization No Has patient had a PCN reaction occurring within the last 10 years: No If all of the above answers are "NO", then may proceed with Cephalosporin use.  [name] Phenergan [Promethazine Hcl] Nausea And Vomiting    Antimicrobials this admission: Levofloxacin 4/16 >> 4/19, resumed 4/22 Vanc/aztreonam/metronidazole 4/21 >> 4/22 Spetra 4/24 >>   Dose adjustments this admission:   Microbiology results: 4/16 BCx: NGTD 4/16 Peritoneal Fluid Cx: NGTD 4/21 MRSA PCR: negative  Thank you for allowing pharmacy to be a part of this patient's care.  Clarisa Schools, PharmD Clinical Pharmacist 07/24/2015

## 2015-07-24 NOTE — Progress Notes (Signed)
Patient ID: Kimberly Montgomery, female   DOB: 04-16-1965, 50 y.o.   MRN: [id number] Kimberly Montgomery   PATIENT NAME: Kimberly Montgomery    MR#:  [id number]  DATE OF BIRTH:  1965-08-25  SUBJECTIVE:  Patient with nausea and vomiting this am x 2. No abdominal pain No seizutres or myoclonic jerks  REVIEW OF SYSTEMS:   Review of Systems  Constitutional: Negative for fever, chills and weight loss.  HENT: Negative for ear discharge, ear pain and nosebleeds.   Eyes: Negative for blurred vision, pain and discharge.  Respiratory: Negative for sputum production, shortness of breath, wheezing and stridor.   Cardiovascular: Negative for chest pain, palpitations, orthopnea and PND.  Gastrointestinal: Positive for nausea, vomiting and blood in stool. Negative for abdominal pain, diarrhea and melena.  Genitourinary: Negative for urgency and frequency.  Musculoskeletal: Negative for back pain and joint pain.  Neurological: Positive for weakness. Negative for sensory change, speech change, focal weakness and seizures.  Psychiatric/Behavioral: Negative for depression and hallucinations. The patient is not nervous/anxious.    Tolerating Diet:yes  Tolerating PT: rec rehab  DRUG ALLERGIES:   Allergies  Allergen Reactions  . Cephalosporins Anaphylaxis  . Penicillins Anaphylaxis and Other (See Comments)    Has patient had a PCN reaction causing immediate rash, facial/tongue/throat swelling, SOB or lightheadedness with hypotension: Yes Has patient had a PCN reaction causing severe rash involving mucus membranes or skin necrosis: No Has patient had a PCN reaction that required hospitalization No Has patient had a PCN reaction occurring within the last 10 years: No If all of the above answers are "NO", then may proceed with Cephalosporin use.  [name] Phenergan [Promethazine Hcl] Nausea And Vomiting    VITALS:  Blood pressure 109/55, pulse 74, temperature 98.4 F (36.9 C),  temperature source Oral, resp. rate 16, height 5' 9.02" (1.753 m), weight 131.4 kg (289 lb 11 oz), last menstrual period 03/16/2015, SpO2 100 %.  PHYSICAL EXAMINATION:   Physical Exam  Constitutional: She is oriented to person, place, and time and well-developed, well-nourished, and in no distress. No distress.  HENT:  Head: Normocephalic.  Eyes: No scleral icterus.  Neck: Normal range of motion. Neck supple. No JVD present. No tracheal deviation present.  Cardiovascular: Normal rate, regular rhythm and normal heart sounds.  Exam reveals no gallop and no friction rub.   No murmur heard. Pulmonary/Chest: Effort normal and breath sounds normal. No respiratory distress. She has no wheezes. She has no rales. She exhibits no tenderness.  Abdominal: Soft. Bowel sounds are normal. She exhibits no distension and no mass. There is no tenderness. There is no rebound and no guarding.  Musculoskeletal: Normal range of motion. She exhibits no edema.  Right prosetic leg  Neurological: She is alert and oriented to person, place, and time.  Skin: Skin is warm. No rash noted. No erythema.  Psychiatric: Affect and judgment normal.      LABORATORY PANEL:  CBC  Recent Labs Lab 07/23/15 0435  WBC 13.7*  HGB 7.4*  HCT 22.1*  PLT 263    Chemistries   Recent Labs Lab 07/21/15 0427  07/23/15 0435  NA 128*  < > 130*  K 4.2  < > 3.9  CL 91*  < > 91*  CO2 22  < > 23  GLUCOSE 157*  < > 118*  BUN 62*  < > 61*  CREATININE 9.89*  < > 10.25*  CALCIUM 5.9*  < > 6.1*  MG  1.9  --   --   < > = values in this interval not displayed. Cardiac Enzymes  Recent Labs Lab 07/21/15 2150  TROPONINI <0.03   RADIOLOGY:  No results found. ASSESSMENT AND PLAN:  50 year old female patient with history of end-stage renal disease on peritoneal dialysis, hypertension, coronary artery disease, congestive heart failure presented to the emergency room with nausea and vomiting and abdominal discomfort.  1.  Generalized tonic-clonic seizures new onset started on 07/21/2015 Continue Depakote 1000 mg twice a day and keppra 750 mg BID EEG per Dr. Thad Ranger shows metabolic changes Repeat CT head 4/ 21 negative   2 Acute peritonitis with nausea vomiting and diffuse abdominal pain. Peritoneal fluid with neutrophils greater than 70% Continue Levofloxacin 500 mg QOD (total 10 days from 07/15/15) No need for vanc per nephrology. -repeat PD fluid clearing. ORDER KUB today for nausea/vomitting  3 End-stage renal disease on peritoneal dialysis Continue peritoneal dialysis as recommended by nephrology.  Patient declining to convert to hemodialysis  4 Gastroparesis  Continue supportive care and PROTONIC Tolerating po diet well. Erythromycin discontinued  5 Hypertension Continue Coreg and IMDUR and discontinue LISINOPRIL and Norvasc due to low blood pressure  6. Congestive heart failure, chronic, diastolic - continue home medication aspirin 81 mg, Plavix 75 mg, Coreg    7 Myoclonic jerks suspected due to gabapentin which was discontinued by Dr. Thad Ranger from neurology   Case discussed with Social Worker. Management plans discussed with the patient and she is in agreement.  CODE STATUS:full   DVT Prophylaxis: heparin TOTAL TIME TAKING CARE OF THIS PATIENT: 25 minutes.  >50% time spent on counselling and coordination of care  Waiting to be transferred to [facility]  Note: This dictation was prepared with Dragon dictation along with smaller phrase technology. Any transcriptional errors that result from this process are unintentional.  Kimberly Montgomery M.D on 07/24/2015 at 9:03 AM  Between 7am to 6pm - Pager - [phone]  After 6pm go to [email or web address] - password [email or web address] [facility]  [place] [facility]  Office  [phone]  CC: Primary care physician; Kimberly Matin, MD

## 2015-07-24 NOTE — Progress Notes (Signed)
Dr. Juliene Pina made aware of pt's redden lt hip/thigh area and about her home dose of Erythromycin.

## 2015-07-24 NOTE — Care Management Important Message (Signed)
Important Message  Patient Details  Name: Kimberly Montgomery MRN: [id number] Date of Birth: 1965-09-12   Medicare Important Message Given:  Yes    Kabao Leite A, RN 07/24/2015, 7:27 AM

## 2015-07-24 NOTE — Progress Notes (Signed)
Pt vomited approx undigetsted food, Zofran 2mg  iv given, will monitor.

## 2015-07-24 NOTE — Care Management Note (Addendum)
Case Management Note  Patient Details  Name: Kimberly Montgomery MRN: [id number] Date of Birth: 06-18-1965  Subjective/Objective:    Per call this morning to Tiffany at the [facility], [name] has been accepted by Dr Mayford Knife at [facility] but there are currently no beds available. [name] is still on the transfer list at [facility].  KUB ordered today per vomiting back food.              Action/Plan:   Expected Discharge Date:                  Expected Discharge Plan:     In-House Referral:     Discharge planning Services     Post Acute Care Choice:    Choice offered to:     DME Arranged:    DME Agency:     HH Arranged:    HH Agency:     Status of Service:     Medicare Important Message Given:  Yes Date Medicare IM Given:    Medicare IM give by:    Date Additional Medicare IM Given:    Additional Medicare Important Message give by:     If discussed at Long Length of Stay Meetings, dates discussed:    Additional Comments:  Makara Lanzo A, RN 07/24/2015, 8:11 AM

## 2015-07-24 NOTE — Plan of Care (Signed)
Problem: Tissue Perfusion: Goal: Risk factors for ineffective tissue perfusion will decrease Outcome: Progressing SQ heparin  Problem: Fluid Volume: Goal: Ability to maintain a balanced intake and output will improve Outcome: Not Progressing Daily weights, I&O, PD

## 2015-07-24 NOTE — Progress Notes (Signed)
Subjective:   No further seizure episode Reports nausea and vomiting Poor PO intake + peripheral edema  Objective:  Vital signs in last 24 hours:  Temp:  [98 F (36.7 C)-98.6 F (37 C)] 98 F (36.7 C) (04/24 1129) Pulse Rate:  [54-123] 69 (04/24 1129) Resp:  [11-19] 18 (04/24 1129) BP: (90-112)/(28-73) 101/55 mmHg (04/24 1129) SpO2:  [91 %-100 %] 93 % (04/24 1129) Weight:  [131.4 kg (289 lb 11 oz)] 131.4 kg (289 lb 11 oz) (04/24 0500)  Weight change: 3.8 kg (8 lb 6 oz) Filed Weights   07/22/15 0249 07/23/15 0500 07/24/15 0500  Weight: 131.9 kg (290 lb 12.6 oz) 127.6 kg (281 lb 4.9 oz) 131.4 kg (289 lb 11 oz)    Intake/Output:    Intake/Output Summary (Last 24 hours) at 07/24/15 1509 Last data filed at 07/24/15 1010  Gross per 24 hour  Intake    746 ml  Output    866 ml  Net   -120 ml     Physical Exam: General: NAD  HEENT Anicteric, moist oral mucous membranes  Neck supple  Pulm/lungs Normal effort, clear to auscultation bilaterally  CVS/Heart No rub or gallop  Abdomen:  Soft, nontender  Extremities: No peripheral edema, right BKA  Neurologic: Alert, oriented, speech normal, lethargic  Skin: No acute rashes  Access: PD catheter, exit site nontender and clean       Basic Metabolic Panel:   Recent Labs Lab 07/20/15 0547 07/21/15 0427 07/21/15 2150 07/23/15 0435  NA  --  128* 126* 130*  K  --  4.2 4.8 3.9  CL  --  91* 89* 91*  CO2  --  22 20* 23  GLUCOSE  --  157* 106* 118*  BUN  --  62* 66* 61*  CREATININE  --  9.89* 10.62* 10.25*  CALCIUM  --  5.9* 6.0* 6.1*  MG  --  1.9  --   --   PHOS 11.7*  --   --   --      CBC:  Recent Labs Lab 07/21/15 0427 07/21/15 2150 07/23/15 0435  WBC 9.5 14.5* 13.7*  NEUTROABS  --  11.9*  --   HGB 8.2* 8.1* 7.4*  HCT 24.5* 24.6* 22.1*  MCV 84.1 83.0 82.5  PLT 250 302 263      Microbiology:  Recent Results (from the past 720 hour(s))  Body fluid culture     Status: None   Collection Time:  07/15/15 10:23 PM  Result Value Ref Range Status   Specimen Description PERITONEAL  Final   Special Requests Normal  Final   Gram Stain RARE WBC SEEN NO ORGANISMS SEEN   Final   Culture No growth aerobically or anaerobically.  Final   Report Status 07/19/2015 FINAL  Final  Culture, blood (routine x 2)     Status: None   Collection Time: 07/16/15  3:58 AM  Result Value Ref Range Status   Specimen Description BLOOD RIGHT WRIST  Final   Special Requests   Final    BOTTLES DRAWN AEROBIC AND ANAEROBIC 12CCAERO,6CCANA   Culture NO GROWTH 5 DAYS  Final   Report Status 07/21/2015 FINAL  Final  Culture, blood (routine x 2)     Status: None   Collection Time: 07/16/15  3:58 AM  Result Value Ref Range Status   Specimen Description BLOOD RIGHT FOREARM  Final   Special Requests   Final    BOTTLES DRAWN AEROBIC AND ANAEROBIC 12CCAERO,2CCANA   Culture  NO GROWTH 5 DAYS  Final   Report Status 07/21/2015 FINAL  Final  MRSA PCR Screening     Status: None   Collection Time: 07/21/15 10:45 PM  Result Value Ref Range Status   MRSA by PCR NEGATIVE NEGATIVE Final    Comment:        The GeneXpert MRSA Assay (FDA approved for NASAL specimens only), is one component of a comprehensive MRSA colonization surveillance program. It is not intended to diagnose MRSA infection nor to guide or monitor treatment for MRSA infections.     Coagulation Studies: No results for input(s): LABPROT, INR in the last 72 hours.  Urinalysis: No results for input(s): COLORURINE, LABSPEC, PHURINE, GLUCOSEU, HGBUR, BILIRUBINUR, KETONESUR, PROTEINUR, UROBILINOGEN, NITRITE, LEUKOCYTESUR in the last 72 hours.  Invalid input(s): APPERANCEUR    Imaging: Dg Abd 1 View  07/24/2015  CLINICAL DATA:  Pt admitted since 4/15 with abdominal pain and nausea, worsening this morning; known h/o end-stage renal disease, on peritoneal dialysis. EXAM: ABDOMEN - 1 VIEW COMPARISON:  07/15/2015 FINDINGS: 3 supine views. Exam degraded by  patient body habitus. Right hemidiaphragm elevation. Colonic stool burden suggests constipation. Gas-filled small bowel loops in the left-sided abdomen measure up to 2.7 cm, upper normal. Dialysis catheter terminating over the central pelvis. Minimal gas in the rectum. IMPRESSION: Possible constipation. Decreased sensitivity and specificity exam due to technique related factors, as described above. Electronically Signed   By: Jeronimo Greaves M.D.   On: 07/24/2015 11:59     Medications:     . antiseptic oral rinse  7 mL Mouth Rinse BID  . aspirin EC  81 mg Oral Daily  . calcium acetate  2,001 mg Oral TID WC  . calcium carbonate  1 tablet Oral TID WC  . carvedilol  6.25 mg Oral BID WC  . clopidogrel  75 mg Oral Daily  . dialysis solution 2.5% low-MG/low-CA   Intraperitoneal Q24H  . dialysis solution 2.5% low-MG/low-CA   Intraperitoneal Q24H  . divalproex  1,000 mg Oral BID  . docusate sodium  100 mg Oral BID  . erythromycin  250 mg Oral TID WC & HS  . ezetimibe  10 mg Oral QHS  . gentamicin cream  1 application Topical Daily  . heparin  5,000 Units Subcutaneous 3 times per day  . insulin aspart  0-5 Units Subcutaneous QHS  . insulin aspart  0-9 Units Subcutaneous TID WC  . isosorbide mononitrate  60 mg Oral Daily  . levETIRAcetam  750 mg Oral BID  . levofloxacin (LEVAQUIN) IV  500 mg Intravenous Q48H  . pantoprazole  40 mg Oral BID  . polyethylene glycol  17 g Oral Daily  . ranolazine  500 mg Oral BID  . sodium chloride flush  3 mL Intravenous Q12H  . [START ON 07/26/2015] sulfamethoxazole-trimethoprim  1 tablet Oral Q48H  . tuberculin  5 Units Intradermal Once   sodium chloride, acetaminophen **OR** acetaminophen, albuterol, docusate sodium, heparin, LORazepam, ondansetron **OR** ondansetron (ZOFRAN) IV, oxyCODONE-acetaminophen, polyethylene glycol, prochlorperazine, sodium chloride flush  Assessment/ Plan:  50 y.o. white  female with past medical history of end-stage renal disease on  peritoneal dialysis, asthma, gastroparesis, diabetes mellitus, congestive heart failure, hypertension, endometriosis, coronary atherosclerosis, medical management  [facility]/[address]/peritoneal dialysis  1. End-stage renal disease on peritoneal dialysis.  Home PD prescription of 3 exchanges 8 hours with 2.2 litre fills. 2.5% dextrose - nightly PD  2. Acute peritonitis in PD patient. WBC cell count 690 with 73% neutrophils on admission.  -  PD fluid culture is no growth - Empiric treatment with broad-spectrum antibiotics vancomycin (started 4/16- most recent dose 4/22) and Levaquin.    3. Anemia chronic kidney disease. - epo as outpatient. Holding now with neurologic changes.   4. Secondary hyperparathyroidism. phos elevated at 11.7 on 4/20. - calcium acetate with meals.      LOS: 8 Kimberly Montgomery 4/24/20173:09 PM

## 2015-07-24 NOTE — Progress Notes (Signed)
Subjective: No further seizure activity overnight.  The patient reports that she has noted no further jerking.   Objective: Current vital signs: BP 101/55 mmHg  Pulse 69  Temp(Src) 98 F (36.7 C) (Oral)  Resp 18  Ht 5' 9.02" (1.753 m)  Wt 131.4 kg (289 lb 11 oz)  BMI 42.76 kg/m2  SpO2 93%  LMP 03/16/2015 Vital signs in last 24 hours: Temp:  [98 F (36.7 C)-98.6 F (37 C)] 98 F (36.7 C) (04/24 1129) Pulse Rate:  [54-123] 69 (04/24 1129) Resp:  [11-19] 18 (04/24 1129) BP: (90-118)/(28-73) 101/55 mmHg (04/24 1129) SpO2:  [91 %-100 %] 93 % (04/24 1129) Weight:  [131.4 kg (289 lb 11 oz)] 131.4 kg (289 lb 11 oz) (04/24 0500)  Intake/Output from previous day: 04/23 0701 - 04/24 0700 In: 1296 [P.O.:1290; I.V.:6] Out: 1958 [Urine:100] Intake/Output this shift: Total I/O In: 3 [I.V.:3] Out: 766 [Other:766] Nutritional status: Diet - low sodium heart healthy Diet - low sodium heart healthy Diet - low sodium heart healthy Diet Carb Modified Fluid consistency:: Thin; Room service appropriate?: Yes  Neurologic Exam: No myoclonus noted  Lab Results: Basic Metabolic Panel:  Recent Labs Lab 07/20/15 0547 07/21/15 0427 07/21/15 2150 07/23/15 0435  NA  --  128* 126* 130*  K  --  4.2 4.8 3.9  CL  --  91* 89* 91*  CO2  --  22 20* 23  GLUCOSE  --  157* 106* 118*  BUN  --  62* 66* 61*  CREATININE  --  9.89* 10.62* 10.25*  CALCIUM  --  5.9* 6.0* 6.1*  MG  --  1.9  --   --   PHOS 11.7*  --   --   --     Liver Function Tests: No results for input(s): AST, ALT, ALKPHOS, BILITOT, PROT, ALBUMIN in the last 168 hours. No results for input(s): LIPASE, AMYLASE in the last 168 hours. No results for input(s): AMMONIA in the last 168 hours.  CBC:  Recent Labs Lab 07/21/15 0427 07/21/15 2150 07/23/15 0435  WBC 9.5 14.5* 13.7*  NEUTROABS  --  11.9*  --   HGB 8.2* 8.1* 7.4*  HCT 24.5* 24.6* 22.1*  MCV 84.1 83.0 82.5  PLT 250 302 263    Cardiac Enzymes:  Recent Labs Lab  07/21/15 2150  TROPONINI <0.03    Lipid Panel: No results for input(s): CHOL, TRIG, HDL, CHOLHDL, VLDL, LDLCALC in the last 168 hours.  CBG:  Recent Labs Lab 07/23/15 1127 07/23/15 1731 07/23/15 2059 07/24/15 0721 07/24/15 1131  GLUCAP 135* 133* 206* 104* 111*    Microbiology: Results for orders placed or performed during the hospital encounter of 07/15/15  Body fluid culture     Status: None   Collection Time: 07/15/15 10:23 PM  Result Value Ref Range Status   Specimen Description PERITONEAL  Final   Special Requests Normal  Final   Gram Stain RARE WBC SEEN NO ORGANISMS SEEN   Final   Culture No growth aerobically or anaerobically.  Final   Report Status 07/19/2015 FINAL  Final  Culture, blood (routine x 2)     Status: None   Collection Time: 07/16/15  3:58 AM  Result Value Ref Range Status   Specimen Description BLOOD RIGHT WRIST  Final   Special Requests   Final    BOTTLES DRAWN AEROBIC AND ANAEROBIC 12CCAERO,6CCANA   Culture NO GROWTH 5 DAYS  Final   Report Status 07/21/2015 FINAL  Final  Culture, blood (routine  x 2)     Status: None   Collection Time: 07/16/15  3:58 AM  Result Value Ref Range Status   Specimen Description BLOOD RIGHT FOREARM  Final   Special Requests   Final    BOTTLES DRAWN AEROBIC AND ANAEROBIC 12CCAERO,2CCANA   Culture NO GROWTH 5 DAYS  Final   Report Status 07/21/2015 FINAL  Final  MRSA PCR Screening     Status: None   Collection Time: 07/21/15 10:45 PM  Result Value Ref Range Status   MRSA by PCR NEGATIVE NEGATIVE Final    Comment:        The GeneXpert MRSA Assay (FDA approved for NASAL specimens only), is one component of a comprehensive MRSA colonization surveillance program. It is not intended to diagnose MRSA infection nor to guide or monitor treatment for MRSA infections.     Coagulation Studies: No results for input(s): LABPROT, INR in the last 72 hours.  Imaging: Dg Abd 1 View  07/24/2015  CLINICAL DATA:  Pt  admitted since 4/15 with abdominal pain and nausea, worsening this morning; known h/o end-stage renal disease, on peritoneal dialysis. EXAM: ABDOMEN - 1 VIEW COMPARISON:  07/15/2015 FINDINGS: 3 supine views. Exam degraded by patient body habitus. Right hemidiaphragm elevation. Colonic stool burden suggests constipation. Gas-filled small bowel loops in the left-sided abdomen measure up to 2.7 cm, upper normal. Dialysis catheter terminating over the central pelvis. Minimal gas in the rectum. IMPRESSION: Possible constipation. Decreased sensitivity and specificity exam due to technique related factors, as described above. Electronically Signed   By: Jeronimo Greaves M.D.   On: 07/24/2015 11:59    Medications:  I have reviewed the patient's current medications. Scheduled: . antiseptic oral rinse  7 mL Mouth Rinse BID  . aspirin EC  81 mg Oral Daily  . calcium acetate  2,001 mg Oral TID WC  . calcium carbonate  1 tablet Oral TID WC  . carvedilol  6.25 mg Oral BID WC  . clopidogrel  75 mg Oral Daily  . dialysis solution 2.5% low-MG/low-CA   Intraperitoneal Q24H  . dialysis solution 2.5% low-MG/low-CA   Intraperitoneal Q24H  . divalproex  1,000 mg Oral BID  . docusate sodium  100 mg Oral BID  . erythromycin  250 mg Oral TID WC & HS  . ezetimibe  10 mg Oral QHS  . gentamicin cream  1 application Topical Daily  . heparin  5,000 Units Subcutaneous 3 times per day  . insulin aspart  0-5 Units Subcutaneous QHS  . insulin aspart  0-9 Units Subcutaneous TID WC  . isosorbide mononitrate  60 mg Oral Daily  . levETIRAcetam  750 mg Oral BID  . levofloxacin (LEVAQUIN) IV  500 mg Intravenous Q48H  . pantoprazole  40 mg Oral BID  . polyethylene glycol  17 g Oral Daily  . ranolazine  500 mg Oral BID  . sodium chloride flush  3 mL Intravenous Q12H  . tuberculin  5 Units Intradermal Once    Assessment/Plan: Patient without further seizure activity and is noting no further myoclonus.  On Depakote and  Keppra.  Recommendations: 1.  Continue current anticonvulsant therapy at current doses.   2. CMET   LOS: 8 days   Thana Farr, MD Neurology [phone] 07/24/2015  12:29 PM

## 2015-07-24 NOTE — Progress Notes (Signed)
PT Cancellation Note  Patient Details Name: Kimberly Montgomery MRN: [id number] DOB: February 22, 1966   Cancelled Treatment:    Reason Eval/Treat Not Completed: Other (comment) PT held d/t patient not medically stable for treatment. Will need new orders as pt was transferred to ICU over the weekend. Pt last seen for PT on 4/20, recommended pt sent to SNF after discharge from acute hospitalization.    Dorita Fray 07/24/2015, 8:03 AM M. Hettie Holstein, SPT

## 2015-07-24 NOTE — Progress Notes (Signed)
Pre-peritoneal dialysis connection. 

## 2015-07-24 NOTE — Progress Notes (Signed)
Per on call hemonurse,C.Williams, ICU staff called him around 0200 to get instructions on how to disconnect patient due to patient being moved from ICU to 236.Patient had completed 2 of 3 cycles.ICU nurse stopped treatment.Dr.Singh made aware this morning by this writer of disconnect,no new orders given.

## 2015-07-24 NOTE — Progress Notes (Signed)
Pt still feeling nauseated, vomited again.

## 2015-07-24 NOTE — Progress Notes (Signed)
Peritoneal dialysis connected 

## 2015-07-24 NOTE — Progress Notes (Signed)
Back from xray

## 2015-07-25 ENCOUNTER — Encounter: Payer: Self-pay | Admitting: Radiology

## 2015-07-25 ENCOUNTER — Inpatient Hospital Stay: Payer: Medicare HMO

## 2015-07-25 DIAGNOSIS — R569 Unspecified convulsions: Secondary | ICD-10-CM

## 2015-07-25 DIAGNOSIS — F319 Bipolar disorder, unspecified: Secondary | ICD-10-CM

## 2015-07-25 LAB — COMPREHENSIVE METABOLIC PANEL
ALBUMIN: 1.8 g/dL — AB (ref 3.5–5.0)
ALK PHOS: 83 U/L (ref 38–126)
ALT: 13 U/L — AB (ref 14–54)
AST: 17 U/L (ref 15–41)
Anion gap: 14 (ref 5–15)
BILIRUBIN TOTAL: 0.6 mg/dL (ref 0.3–1.2)
BUN: 61 mg/dL — ABNORMAL HIGH (ref 6–20)
CO2: 26 mmol/L (ref 22–32)
CREATININE: 10.58 mg/dL — AB (ref 0.44–1.00)
Calcium: 6.1 mg/dL — CL (ref 8.9–10.3)
Chloride: 89 mmol/L — ABNORMAL LOW (ref 101–111)
GFR calc non Af Amer: 4 mL/min — ABNORMAL LOW (ref >60)
GFR, EST AFRICAN AMERICAN: 4 mL/min — AB (ref >60)
GLUCOSE: 91 mg/dL (ref 65–99)
Potassium: 4.4 mmol/L (ref 3.5–5.1)
SODIUM: 129 mmol/L — AB (ref 135–145)
TOTAL PROTEIN: 5.1 g/dL — AB (ref 6.5–8.1)

## 2015-07-25 LAB — GLUCOSE, CAPILLARY
GLUCOSE-CAPILLARY: 99 mg/dL (ref 65–99)
GLUCOSE-CAPILLARY: 70 mg/dL (ref 65–99)
Glucose-Capillary: 92 mg/dL (ref 65–99)
Glucose-Capillary: 210 mg/dL — ABNORMAL HIGH (ref 65–99)
Glucose-Capillary: 129 mg/dL — ABNORMAL HIGH (ref 65–99)

## 2015-07-25 LAB — CBC
HEMATOCRIT: 23.2 % — AB (ref 35.0–47.0)
HEMOGLOBIN: 7.8 g/dL — AB (ref 12.0–16.0)
MCH: 28.1 pg (ref 26.0–34.0)
MCHC: 33.7 g/dL (ref 32.0–36.0)
MCV: 83.2 fL (ref 80.0–100.0)
Platelets: 296 10*3/uL (ref 150–440)
RBC: 2.79 MIL/uL — ABNORMAL LOW (ref 3.80–5.20)
RDW: 13.6 % (ref 11.5–14.5)
WBC: 10.8 10*3/uL (ref 3.6–11.0)

## 2015-07-25 LAB — BODY FLUID CELL COUNT WITH DIFFERENTIAL
Lymphs, Fluid: 50 %
MONOCYTE-MACROPHAGE-SEROUS FLUID: 26 %
NEUTROPHIL FLUID: 24 %
WBC FLUID: 21 uL

## 2015-07-25 LAB — PATHOLOGIST SMEAR REVIEW

## 2015-07-25 LAB — VANCOMYCIN, RANDOM: Vancomycin Rm: 20 ug/mL

## 2015-07-25 MED ORDER — IOPAMIDOL (ISOVUE-300) INJECTION 61%
100.0000 mL | Freq: Once | INTRAVENOUS | Status: AC | PRN
  Administered 2015-07-25: 100 mL via INTRAVENOUS

## 2015-07-25 MED ORDER — LACTULOSE 10 GM/15ML PO SOLN: 30.0000 g | Freq: Two times a day (BID) | ORAL | Status: DC | PRN

## 2015-07-25 MED ORDER — ARIPIPRAZOLE 2 MG PO TABS
2.0000 mg | ORAL_TABLET | Freq: Every day | ORAL | Status: DC
  Administered 2015-07-25 – 2015-08-01 (×6): 2 mg via ORAL
  Filled 2015-07-25 (×8): qty 1

## 2015-07-25 NOTE — Progress Notes (Signed)
Subjective:   No further seizure episode nausea and vomiting- improved. Tolerated breakfast this AM Poor PO intake + peripheral edema PD went OK  Objective:  Vital signs in last 24 hours:  Temp:  [98 F (36.7 C)-98.6 F (37 C)] 98.6 F (37 C) (04/25 1108) Pulse Rate:  [67-75] 72 (04/25 1108) Resp:  [16-18] 18 (04/25 1108) BP: (97-129)/(41-48) 107/44 mmHg (04/25 1108) SpO2:  [92 %-96 %] 96 % (04/25 1108) Weight:  [130.847 kg (288 lb 7.4 oz)] 130.847 kg (288 lb 7.4 oz) (04/25 0423)  Weight change: -0.553 kg (-1 lb 3.5 oz) Filed Weights   07/23/15 0500 07/24/15 0500 07/25/15 0423  Weight: 127.6 kg (281 lb 4.9 oz) 131.4 kg (289 lb 11 oz) 130.847 kg (288 lb 7.4 oz)    Intake/Output:    Intake/Output Summary (Last 24 hours) at 07/25/15 1437 Last data filed at 07/25/15 1021  Gross per 24 hour  Intake    120 ml  Output   1267 ml  Net  -1147 ml     Physical Exam: General: NAD  HEENT Anicteric, moist oral mucous membranes  Neck supple  Pulm/lungs Normal effort, clear to auscultation bilaterally  CVS/Heart No rub or gallop  Abdomen:  Soft, nontender  Extremities: + dependent peripheral edema, right BKA  Neurologic: Alert, oriented, speech normal, lethargic  Skin: No acute rashes  Access: PD catheter, exit site nontender and clean       Basic Metabolic Panel:   Recent Labs Lab 07/20/15 0547  07/21/15 0427 07/21/15 2150 07/23/15 0435 07/25/15 0454  NA  --   --  128* 126* 130* 129*  K  --   --  4.2 4.8 3.9 4.4  CL  --   --  91* 89* 91* 89*  CO2  --   --  22 20* 23 26  GLUCOSE  --   --  157* 106* 118* 91  BUN  --   --  62* 66* 61* 61*  CREATININE  --   --  9.89* 10.62* 10.25* 10.58*  CALCIUM  --   < > 5.9* 6.0* 6.1* 6.1*  MG  --   --  1.9  --   --   --   PHOS 11.7*  --   --   --   --   --   < > = values in this interval not displayed.   CBC:  Recent Labs Lab 07/21/15 0427 07/21/15 2150 07/23/15 0435 07/25/15 0454  WBC 9.5 14.5* 13.7* 10.8   NEUTROABS  --  11.9*  --   --   HGB 8.2* 8.1* 7.4* 7.8*  HCT 24.5* 24.6* 22.1* 23.2*  MCV 84.1 83.0 82.5 83.2  PLT 250 302 263 296      Microbiology:  Recent Results (from the past 720 hour(s))  Body fluid culture     Status: None   Collection Time: 07/15/15 10:23 PM  Result Value Ref Range Status   Specimen Description PERITONEAL  Final   Special Requests Normal  Final   Gram Stain RARE WBC SEEN NO ORGANISMS SEEN   Final   Culture No growth aerobically or anaerobically.  Final   Report Status 07/19/2015 FINAL  Final  Culture, blood (routine x 2)     Status: None   Collection Time: 07/16/15  3:58 AM  Result Value Ref Range Status   Specimen Description BLOOD RIGHT WRIST  Final   Special Requests   Final    BOTTLES DRAWN AEROBIC AND ANAEROBIC 12CCAERO,6CCANA  Culture NO GROWTH 5 DAYS  Final   Report Status 07/21/2015 FINAL  Final  Culture, blood (routine x 2)     Status: None   Collection Time: 07/16/15  3:58 AM  Result Value Ref Range Status   Specimen Description BLOOD RIGHT FOREARM  Final   Special Requests   Final    BOTTLES DRAWN AEROBIC AND ANAEROBIC 12CCAERO,2CCANA   Culture NO GROWTH 5 DAYS  Final   Report Status 07/21/2015 FINAL  Final  MRSA PCR Screening     Status: None   Collection Time: 07/21/15 10:45 PM  Result Value Ref Range Status   MRSA by PCR NEGATIVE NEGATIVE Final    Comment:        The GeneXpert MRSA Assay (FDA approved for NASAL specimens only), is one component of a comprehensive MRSA colonization surveillance program. It is not intended to diagnose MRSA infection nor to guide or monitor treatment for MRSA infections.     Coagulation Studies: No results for input(s): LABPROT, INR in the last 72 hours.  Urinalysis: No results for input(s): COLORURINE, LABSPEC, PHURINE, GLUCOSEU, HGBUR, BILIRUBINUR, KETONESUR, PROTEINUR, UROBILINOGEN, NITRITE, LEUKOCYTESUR in the last 72 hours.  Invalid input(s): APPERANCEUR    Imaging: Dg Abd 1  View  07/24/2015  CLINICAL DATA:  Pt admitted since 4/15 with abdominal pain and nausea, worsening this morning; known h/o end-stage renal disease, on peritoneal dialysis. EXAM: ABDOMEN - 1 VIEW COMPARISON:  07/15/2015 FINDINGS: 3 supine views. Exam degraded by patient body habitus. Right hemidiaphragm elevation. Colonic stool burden suggests constipation. Gas-filled small bowel loops in the left-sided abdomen measure up to 2.7 cm, upper normal. Dialysis catheter terminating over the central pelvis. Minimal gas in the rectum. IMPRESSION: Possible constipation. Decreased sensitivity and specificity exam due to technique related factors, as described above. Electronically Signed   By: Jeronimo Greaves M.D.   On: 07/24/2015 11:59     Medications:     . antiseptic oral rinse  7 mL Mouth Rinse BID  . aspirin EC  81 mg Oral Daily  . calcium acetate  2,001 mg Oral TID WC  . calcium carbonate  1 tablet Oral TID WC  . carvedilol  6.25 mg Oral BID WC  . clopidogrel  75 mg Oral Daily  . dialysis solution 2.5% low-MG/low-CA   Intraperitoneal Q24H  . dialysis solution 2.5% low-MG/low-CA   Intraperitoneal Q24H  . divalproex  1,000 mg Oral BID  . docusate sodium  100 mg Oral BID  . erythromycin  250 mg Oral TID WC & HS  . ezetimibe  10 mg Oral QHS  . gentamicin cream  1 application Topical Daily  . heparin  5,000 Units Subcutaneous 3 times per day  . insulin aspart  0-5 Units Subcutaneous QHS  . insulin aspart  0-9 Units Subcutaneous TID WC  . isosorbide mononitrate  60 mg Oral Daily  . levETIRAcetam  750 mg Oral BID  . levofloxacin (LEVAQUIN) IV  500 mg Intravenous Q48H  . pantoprazole  40 mg Oral BID  . polyethylene glycol  17 g Oral Daily  . ranolazine  500 mg Oral BID  . sodium chloride flush  3 mL Intravenous Q12H  . [START ON 07/26/2015] sulfamethoxazole-trimethoprim  1 tablet Oral Q48H  . tuberculin  5 Units Intradermal Once   sodium chloride, acetaminophen **OR** acetaminophen, albuterol,  docusate sodium, heparin, lactulose, LORazepam, ondansetron **OR** ondansetron (ZOFRAN) IV, oxyCODONE-acetaminophen, polyethylene glycol, prochlorperazine, sodium chloride flush  Assessment/ Plan:  50 y.o. white  female with  past medical history of end-stage renal disease on peritoneal dialysis, asthma, gastroparesis, diabetes mellitus, congestive heart failure, hypertension, endometriosis, coronary atherosclerosis, medical management  [facility]/[address]/peritoneal dialysis  1. End-stage renal disease on peritoneal dialysis.  Home PD prescription of 3 exchanges 8 hours with 2.2 litre fills. 2.5% dextrose - nightly PD UF 1200  2. Acute peritonitis in PD patient. WBC cell count 690 with 73% neutrophils on admission.  - PD fluid culture is no growth - Empiric treatment with broad-spectrum antibiotics vancomycin (started 4/16- most recent dose 4/22) and Levaquin.   - VANC level 20 today  3. Anemia chronic kidney disease. - epo as outpatient. Holding now with neurologic changes.   4. Secondary hyperparathyroidism. phos elevated at 11.7 on 4/20. - calcium acetate with meals.      LOS: 9 Aarik Blank 4/25/20172:37 PM

## 2015-07-25 NOTE — Progress Notes (Signed)
[facility] transfer called to confirm that patient still needed to be on wait list. This RN confirmed that patient still needs to remain on the list.

## 2015-07-25 NOTE — Progress Notes (Addendum)
Patient ID: Kimberly Montgomery, female   DOB: January 08, 1966, 50 y.o.   MRN: [id number] [place] [facility]   PATIENT NAME: Kimberly Montgomery    MR#:  [id number]  DATE OF BIRTH:  [birth year]  SUBJECTIVE:   Patient with improved nausea. Tolerated diet this morning.  REVIEW OF SYSTEMS:   Review of Systems  Constitutional: Negative for fever, chills and weight loss.  HENT: Negative for ear discharge, ear pain and nosebleeds.   Eyes: Negative for blurred vision, pain and discharge.  Respiratory: Negative for sputum production, shortness of breath, wheezing and stridor.   Cardiovascular: Negative for chest pain, palpitations, orthopnea and PND.  Gastrointestinal: Positive for nausea, vomiting and blood in stool. Negative for abdominal pain, diarrhea and melena.  Genitourinary: Negative for urgency and frequency.  Musculoskeletal: Negative for back pain and joint pain.  Neurological: Positive for weakness. Negative for sensory change, speech change, focal weakness and seizures.  Psychiatric/Behavioral: Negative for depression and hallucinations. The patient is not nervous/anxious.    Tolerating Diet:yes  Tolerating PT: rec rehab  DRUG ALLERGIES:   Allergies  Allergen Reactions  . Cephalosporins Anaphylaxis  . Penicillins Anaphylaxis and Other (See Comments)    Has patient had a PCN reaction causing immediate rash, facial/tongue/throat swelling, SOB or lightheadedness with hypotension: Yes Has patient had a PCN reaction causing severe rash involving mucus membranes or skin necrosis: No Has patient had a PCN reaction that required hospitalization No Has patient had a PCN reaction occurring within the last 10 years: No If all of the above answers are "NO", then may proceed with Cephalosporin use.  [name] Phenergan [Promethazine Hcl] Nausea And Vomiting    VITALS:  Blood pressure 107/44, pulse 72, temperature 98.6 F (37 C), temperature source Oral, resp. rate 18, height  5' 9.02" (1.753 m), weight 130.847 kg (288 lb 7.4 oz), last menstrual period 03/16/2015, SpO2 96 %.  PHYSICAL EXAMINATION:   Physical Exam  Constitutional: She is oriented to person, place, and time and well-developed, well-nourished, and in no distress. No distress.  HENT:  Head: Normocephalic.  Eyes: No scleral icterus.  Neck: Normal range of motion. Neck supple. No JVD present. No tracheal deviation present.  Cardiovascular: Normal rate, regular rhythm and normal heart sounds.  Exam reveals no gallop and no friction rub.   No murmur heard. Pulmonary/Chest: Effort normal and breath sounds normal. No respiratory distress. She has no wheezes. She has no rales. She exhibits no tenderness.  Abdominal: Soft. Bowel sounds are normal. She exhibits no distension and no mass. There is no tenderness. There is no rebound and no guarding.  Musculoskeletal: Normal range of motion. She exhibits no edema.  Right prosetic leg  Neurological: She is alert and oriented to person, place, and time.  Skin: Skin is warm. No rash noted.  Cellulitis left hip  Psychiatric: Affect and judgment normal.      LABORATORY PANEL:  CBC  Recent Labs Lab 07/25/15 0454  WBC 10.8  HGB 7.8*  HCT 23.2*  PLT 296    Chemistries   Recent Labs Lab 07/21/15 0427  07/25/15 0454  NA 128*  < > 129*  K 4.2  < > 4.4  CL 91*  < > 89*  CO2 22  < > 26  GLUCOSE 157*  < > 91  BUN 62*  < > 61*  CREATININE 9.89*  < > 10.58*  CALCIUM 5.9*  < > 6.1*  MG 1.9  --   --  AST  --   --  17  ALT  --   --  13*  ALKPHOS  --   --  83  BILITOT  --   --  0.6  < > = values in this interval not displayed. Cardiac Enzymes  Recent Labs Lab 07/21/15 2150  TROPONINI <0.03   RADIOLOGY:  Dg Abd 1 View  07/24/2015  CLINICAL DATA:  Pt admitted since 4/15 with abdominal pain and nausea, worsening this morning; known h/o end-stage renal disease, on peritoneal dialysis. EXAM: ABDOMEN - 1 VIEW COMPARISON:  07/15/2015 FINDINGS: 3  supine views. Exam degraded by patient body habitus. Right hemidiaphragm elevation. Colonic stool burden suggests constipation. Gas-filled small bowel loops in the left-sided abdomen measure up to 2.7 cm, upper normal. Dialysis catheter terminating over the central pelvis. Minimal gas in the rectum. IMPRESSION: Possible constipation. Decreased sensitivity and specificity exam due to technique related factors, as described above. Electronically Signed   By: Jeronimo Greaves M.D.   On: 07/24/2015 11:59   ASSESSMENT AND PLAN:  50 year old female patient with history of end-stage renal disease on peritoneal dialysis, hypertension, coronary artery disease, congestive heart failure presented to the emergency room with nausea and vomiting and abdominal discomfort.  1. Generalized tonic-clonic seizures new onset started on 07/21/2015 Continue Depakote 1000 mg twice a day and keppra 750 mg BID (may have to adjust dose as per pharmacy) EEG per Dr. Thad Ranger shows metabolic changes Repeat CT head 4/ 21 negative   2 Acute peritonitis with nausea vomiting and diffuse abdominal pain. Peritoneal fluid with neutrophils greater than 70% Today is last day for  Levofloxacin 500 mg QOD (total 10 days from 07/15/15).  No need for vanc per nephrology. The repeat PD fluid clearing.  3 End-stage renal disease on peritoneal dialysis Continue peritoneal dialysis as recommended by nephrology.  Patient declining to convert to hemodialysis  4 Gastroparesis: Patient improving. Continue erythromycin.   5 Hypertension Continue Coreg and IMDUR and discontinued LISINOPRIL and Norvasc due to low blood pressure  6. Congestive heart failure, chronic, diastolic Continue home medication aspirin 81 mg, Plavix 75 mg, Coreg    7 Myoclonic jerks suspected due to gabapentin which was discontinued by Dr. Thad Ranger from neurology Wellbutrin is also discontinued as per Dr. Thad Ranger.  Psych consult in place for substitution for  Wellbutrin.  8. Cellulitis: Continue Bactrim and order CT scan to evaluate for underlying abscess.  9. Anemia of chronic kidney disease: Hemoglobin is stable.  10. Hyponatremia: We'll await nephrology recommendations.  Case discussed with case manager Management plans discussed with the patient and she is in agreement. Family would like to go home with home health care at discharge. It would not like transfer to [facility]. CODE STATUS:full   DVT Prophylaxis: heparin TOTAL TIME TAKING CARE OF THIS PATIENT: 25 minutes.  >50% time spent on counselling and coordination of care  Note: This dictation was prepared with Dragon dictation along with smaller phrase technology. Any transcriptional errors that result from this process are unintentional.  Nioma Mccubbins M.D on 07/25/2015 at 11:48 AM  Between 7am to 6pm - Pager - [phone]  After 6pm go to [email or web address] - password [email or web address] [facility]  [facility]  Office  [phone]  CC: Primary care physician; Jarome Matin, MD

## 2015-07-25 NOTE — Consult Note (Signed)
[facility] Consult   Reason for Consult:  Consult for 50 year old woman with history of peritoneal dialysis currently in the hospital with parents tinnitus now has developed seizures as well. Referring Physician:  Mody Patient Identification: MAUI AHART MRN:  [id number] Principal Diagnosis: Bipolar disorder Diagnosis:   Patient Active Problem List   Diagnosis Date Noted  . Bipolar I disorder ([place]) [F31.9] 07/25/2015  . Seizures ([place]) [R56.9] 07/25/2015  . Peritonitis ([place]) [K65.9] 07/16/2015  . Unstable angina ([place]) [I20.0] 07/09/2015  . ESRD on peritoneal dialysis ([place]) [N18.6, Z99.2] 07/09/2015  . Accelerated hypertension [I10] 07/09/2015  . Type 2 diabetes mellitus ([place]) [E11.9] 07/09/2015  . CAD (coronary artery disease) [I25.10] 07/09/2015  . HLD (hyperlipidemia) [E78.5] 07/09/2015  . GERD (gastroesophageal reflux disease) [K21.9] 07/09/2015  . Chest pain [R07.9] 05/12/2015    Total Time spent with patient: 45 minutes  Subjective:   AUBREY BLACKARD is a 50 y.o. female patient admitted with patient is not currently able to give any history to speak of. Was admitted to the hospital with peritonitis has developed seizures multiple other medical problems.[name]  HPI:  I will not try to summarize her entire medical course but this is a 50 year old woman with a history of renal failure who was on peritoneal dialysis. Currently in the hospital for peritonitis has a history of diabetes coronary artery disease hyperlipidemia. Patient recently developed seizures. Neurology rightly recommended discontinuing Wellbutrin. Question about appropriate substitution. Patient was interviewed. Her sister was also present. Unfortunately the patient is currently so sedated she was unable to give much in the way of useful history. Sister was able to add a little but not a great deal. Patient evidently has a history of a diagnosis of bipolar disorder and has had psychiatric hospitalizations  in the past most recently though about 12 years ago. Wellbutrin has been used for over 10 years and has been regarded as effective by the patient. It's reported that there've been times when she has self discontinued the medicine for one reason or another and her mood has become more labile. Currently the patient's affect is flat and she is unable to state any mood. No indication of any dangerousness or suicidality and I really can't assess her mood any further. Because of its clear association with increasing the risk of seizures it's appropriate to discontinue Wellbutrin at this point.    Past Psychiatric History: Evidently his past diagnosis of bipolar disorder. I wasn't able to get a clear description of her episodes except that it sounds like they've been mostly depression with some mood lability. Hasn't had a hospitalization in many years. They report that in the past she had tried Paxil which was not effective. They can't remember any other psychiatric medicine she has been on in the past.  Risk to Self: Is patient at risk for suicide?: No Risk to Others:   Prior Inpatient Therapy:   Prior Outpatient Therapy:    Past Medical History:  Past Medical History  Diagnosis Date  . Renal disorder   . Hypertension   . CHF (congestive heart failure) ([place])   . Diabetes mellitus without complication ([place])   . Gastroparesis   . Asthma   . ESRD (end stage renal disease) ([place])   . Endometriosis   . CAD (coronary artery disease)   . HLD (hyperlipidemia)   . GERD (gastroesophageal reflux disease)     Past Surgical History  Procedure Laterality Date  . Hysterotomy    . Below knee leg  amputation    . Pd tube inserstion    . Hd fistula surgery with reversal    . Cardiac catheterization Right 07/10/2015    Procedure: Left Heart Cath and Coronary Angiography;  Surgeon: Dionisio David, MD;  Location: [facility];  Service: Cardiovascular;  Laterality: Right;   Family History:  Family  History  Problem Relation Age of Onset  . CAD    . Diabetes    . Bipolar disorder    . Cervical cancer Mother    Family Psychiatric  History: Family history is not reported Social History:  History  Alcohol Use No     History  Drug Use No    Social History   Social History  . Marital Status: Widowed    Spouse Name: N/A  . Number of Children: N/A  . Years of Education: N/A   Occupational History  . disabled    Social History Main Topics  . Smoking status: Current Every Day Smoker -- 0.50 packs/day    Types: Cigarettes  . Smokeless tobacco: None  . Alcohol Use: No  . Drug Use: No  . Sexual Activity: No   Other Topics Concern  . None   Social History Narrative   Additional Social History:    Allergies:   Allergies  Allergen Reactions  . Cephalosporins Anaphylaxis  . Penicillins Anaphylaxis and Other (See Comments)    Has patient had a PCN reaction causing immediate rash, facial/tongue/throat swelling, SOB or lightheadedness with hypotension: Yes Has patient had a PCN reaction causing severe rash involving mucus membranes or skin necrosis: No Has patient had a PCN reaction that required hospitalization No Has patient had a PCN reaction occurring within the last 10 years: No If all of the above answers are "NO", then may proceed with Cephalosporin use.  [name] Phenergan [Promethazine Hcl] Nausea And Vomiting    Labs:  Results for orders placed or performed during the hospital encounter of 07/15/15 (from the past 48 hour(s))  Glucose, capillary     Status: Abnormal   Collection Time: 07/23/15  8:59 PM  Result Value Ref Range   Glucose-Capillary 206 (H) 65 - 99 mg/dL  Glucose, capillary     Status: Abnormal   Collection Time: 07/24/15  7:21 AM  Result Value Ref Range   Glucose-Capillary 104 (H) 65 - 99 mg/dL  Glucose, capillary     Status: Abnormal   Collection Time: 07/24/15 11:31 AM  Result Value Ref Range   Glucose-Capillary 111 (H) 65 - 99 mg/dL  Glucose,  capillary     Status: Abnormal   Collection Time: 07/24/15  4:32 PM  Result Value Ref Range   Glucose-Capillary 128 (H) 65 - 99 mg/dL  Glucose, capillary     Status: Abnormal   Collection Time: 07/24/15  8:53 PM  Result Value Ref Range   Glucose-Capillary 136 (H) 65 - 99 mg/dL  CBC     Status: Abnormal   Collection Time: 07/25/15  4:54 AM  Result Value Ref Range   WBC 10.8 3.6 - 11.0 K/uL   RBC 2.79 (L) 3.80 - 5.20 MIL/uL   Hemoglobin 7.8 (L) 12.0 - 16.0 g/dL   HCT 23.2 (L) 35.0 - 47.0 %   MCV 83.2 80.0 - 100.0 fL   MCH 28.1 26.0 - 34.0 pg   MCHC 33.7 32.0 - 36.0 g/dL   RDW 13.6 11.5 - 14.5 %   Platelets 296 150 - 440 K/uL  Comprehensive metabolic panel  Status: Abnormal   Collection Time: 07/25/15  4:54 AM  Result Value Ref Range   Sodium 129 (L) 135 - 145 mmol/L   Potassium 4.4 3.5 - 5.1 mmol/L   Chloride 89 (L) 101 - 111 mmol/L   CO2 26 22 - 32 mmol/L   Glucose, Bld 91 65 - 99 mg/dL   BUN 61 (H) 6 - 20 mg/dL   Creatinine, Ser 10.58 (H) 0.44 - 1.00 mg/dL   Calcium 6.1 (LL) 8.9 - 10.3 mg/dL    Comment: CRITICAL RESULT CALLED TO, READ BACK BY AND VERIFIED WITH CALLED KATHY STEWART '@[id number]'  ON 07/25/15 BY HKP    Total Protein 5.1 (L) 6.5 - 8.1 g/dL   Albumin 1.8 (L) 3.5 - 5.0 g/dL   AST 17 15 - 41 U/L   ALT 13 (L) 14 - 54 U/L   Alkaline Phosphatase 83 38 - 126 U/L   Total Bilirubin 0.6 0.3 - 1.2 mg/dL   GFR calc non Af Amer 4 (L) >60 mL/min   GFR calc Af Amer 4 (L) >60 mL/min    Comment: (NOTE) The eGFR has been calculated using the CKD EPI equation. This calculation has not been validated in all clinical situations. eGFR's persistently <60 mL/min signify possible Chronic Kidney Disease.    Anion gap 14 5 - 15  Vancomycin, random     Status: None   Collection Time: 07/25/15  4:54 AM  Result Value Ref Range   Vancomycin Rm 20 ug/mL    Comment:        Random Vancomycin therapeutic range is dependent on dosage and time of specimen collection. A peak range is  20.0-40.0 ug/mL A trough range is 5.0-15.0 ug/mL          Glucose, capillary     Status: None   Collection Time: 07/25/15  7:20 AM  Result Value Ref Range   Glucose-Capillary 92 65 - 99 mg/dL  Body fluid cell count with differential     Status: Abnormal   Collection Time: 07/25/15  9:17 AM  Result Value Ref Range   Fluid Type-FCT PERITONEAL DIALYSIS     Comment: CORRECTED ON 04/25 AT 0954: PREVIOUSLY REPORTED AS Body Fluid   Color, Fluid COLORLESS (A) YELLOW   Appearance, Fluid CLEAR (A) CLEAR   WBC, Fluid 21 cu mm   Neutrophil Count, Fluid 24 %   Lymphs, Fluid 50 %   Monocyte-Macrophage-Serous Fluid 26 %   Eos, Fluid PENDING %   Other Cells, Fluid PENDING %  Pathologist smear review     Status: None   Collection Time: 07/25/15  9:17 AM  Result Value Ref Range   Path Review      Smear of peritoneal dialysate reviewed. Negative for malignancy. Absolute neutrophil count 5 neutrophils per cubic mL.    Comment: Reviewed by Dellia Nims Rubinas, M.D.  Glucose, capillary     Status: None   Collection Time: 07/25/15 11:10 AM  Result Value Ref Range   Glucose-Capillary 99 65 - 99 mg/dL  Glucose, capillary     Status: Abnormal   Collection Time: 07/25/15  4:17 PM  Result Value Ref Range   Glucose-Capillary 210 (H) 65 - 99 mg/dL    Current Facility-Administered Medications  Medication Dose Route Frequency Provider Last Rate Last Dose  . 0.9 %  sodium chloride infusion  250 mL Intravenous PRN Saundra Shelling, MD      . acetaminophen (TYLENOL) tablet 650 mg  650 mg Oral Q6H PRN Saundra Shelling, MD  650 mg at 07/24/15 0128   Or  . acetaminophen (TYLENOL) suppository 650 mg  650 mg Rectal Q6H PRN Pavan Pyreddy, MD      . albuterol (PROVENTIL) (2.5 MG/3ML) 0.083% nebulizer solution 2.5 mg  2.5 mg Nebulization Q6H PRN Saundra Shelling, MD      . antiseptic oral rinse (CPC / CETYLPYRIDINIUM CHLORIDE 0.05%) solution 7 mL  7 mL Mouth Rinse BID Bettey Costa, MD   7 mL at 07/23/15 2315  . ARIPiprazole  (ABILIFY) tablet 2 mg  2 mg Oral Daily Gonzella Lex, MD      . aspirin EC tablet 81 mg  81 mg Oral Daily Pavan Pyreddy, MD   81 mg at 07/25/15 1000  . calcium acetate (PHOSLO) capsule 2,001 mg  2,001 mg Oral TID WC Lavonia Dana, MD   2,001 mg at 07/25/15 1644  . calcium carbonate (TUMS - dosed in mg elemental calcium) chewable tablet 200 mg of elemental calcium  1 tablet Oral TID WC Harrie Foreman, MD   200 mg of elemental calcium at 07/25/15 1643  . carvedilol (COREG) tablet 6.25 mg  6.25 mg Oral BID WC Saundra Shelling, MD   6.25 mg at 07/25/15 1643  . clopidogrel (PLAVIX) tablet 75 mg  75 mg Oral Daily Saundra Shelling, MD   75 mg at 07/25/15 1000  . dialysis solution 2.5% low-MG/low-CA dianeal solution   Intraperitoneal Q24H Harmeet Singh, MD      . dialysis solution 2.5% low-MG/low-CA dianeal solution   Intraperitoneal Q24H Lavonia Dana, MD   6 L at 07/23/15 1145  . divalproex (DEPAKOTE) DR tablet 1,000 mg  1,000 mg Oral BID Alexis Goodell, MD   1,000 mg at 07/25/15 0959  . docusate sodium (COLACE) capsule 100 mg  100 mg Oral QHS PRN Saundra Shelling, MD   100 mg at 07/23/15 2121  . docusate sodium (COLACE) capsule 100 mg  100 mg Oral BID Bettey Costa, MD   100 mg at 07/25/15 1000  . erythromycin (ERY-TAB) EC tablet 250 mg  250 mg Oral TID WC & HS Bettey Costa, MD   250 mg at 07/25/15 1643  . ezetimibe (ZETIA) tablet 10 mg  10 mg Oral QHS Saundra Shelling, MD   10 mg at 07/24/15 2117  . gentamicin cream (GARAMYCIN) 0.1 % 1 application  1 application Topical Daily Harmeet Singh, MD   1 application at 18/29/93 1726  . heparin 1000 unit/ml injection 500 Units  500 Units Intraperitoneal PRN Harmeet Singh, MD      . heparin injection 5,000 Units  5,000 Units Subcutaneous 3 times per day Saundra Shelling, MD   5,000 Units at 07/25/15 1346  . insulin aspart (novoLOG) injection 0-5 Units  0-5 Units Subcutaneous QHS Nicholes Mango, MD   2 Units at 07/23/15 2134  . insulin aspart (novoLOG) injection 0-9 Units  0-9  Units Subcutaneous TID WC Nicholes Mango, MD   3 Units at 07/25/15 1643  . isosorbide mononitrate (IMDUR) 24 hr tablet 60 mg  60 mg Oral Daily Pavan Pyreddy, MD   60 mg at 07/23/15 1056  . lactulose (CHRONULAC) 10 GM/15ML solution 30 g  30 g Oral BID PRN Murlean Iba, MD      . levETIRAcetam (KEPPRA) tablet 750 mg  750 mg Oral BID Lance Coon, MD   750 mg at 07/25/15 0959  . levofloxacin (LEVAQUIN) IVPB 500 mg  500 mg Intravenous Q48H Sital Mody, MD   500 mg at 07/24/15 0814  . LORazepam (  ATIVAN) injection 1 mg  1 mg Intravenous Q2H PRN Srikar Sudini, MD      . ondansetron [facility]) tablet 2 mg  2 mg Oral Q6H PRN Fritzi Mandes, MD   2 mg at 07/23/15 2118   Or  . ondansetron (ZOFRAN) injection 2 mg  2 mg Intravenous Q6H PRN Fritzi Mandes, MD   2 mg at 07/24/15 0813  . oxyCODONE-acetaminophen (PERCOCET/ROXICET) 5-325 MG per tablet 1-2 tablet  1-2 tablet Oral Q6H PRN Fritzi Mandes, MD   2 tablet at 07/24/15 0510  . pantoprazole (PROTONIX) EC tablet 40 mg  40 mg Oral BID Hulen Luster, MD   40 mg at 07/25/15 1643  . polyethylene glycol (MIRALAX / GLYCOLAX) packet 17 g  17 g Oral Daily PRN Saundra Shelling, MD   17 g at 07/23/15 1740  . polyethylene glycol (MIRALAX / GLYCOLAX) packet 17 g  17 g Oral Daily Bettey Costa, MD   17 g at 07/25/15 0959  . prochlorperazine (COMPAZINE) injection 5 mg  5 mg Intravenous Q6H PRN Nicholes Mango, MD   5 mg at 07/25/15 1346  . ranolazine (RANEXA) 12 hr tablet 500 mg  500 mg Oral BID Saundra Shelling, MD   500 mg at 07/25/15 1000  . sodium chloride flush (NS) 0.9 % injection 3 mL  3 mL Intravenous Q12H Saundra Shelling, MD   3 mL at 07/25/15 0803  . sodium chloride flush (NS) 0.9 % injection 3 mL  3 mL Intravenous PRN Saundra Shelling, MD   3 mL at 07/19/15 0206  . [START ON 07/26/2015] sulfamethoxazole-trimethoprim (BACTRIM DS,SEPTRA DS) 800-160 MG per tablet 1 tablet  1 tablet Oral Q48H Crystal G Scarpena, RPH      . tuberculin injection 5 Units  5 Units Intradermal Once Fritzi Mandes, MD   5 Units at  07/23/15 1509    Musculoskeletal: Strength & Muscle Tone: decreased Gait & Station: unable to stand Patient leans: N/A  Psychiatric Specialty Exam: Review of Systems  Unable to perform ROS: mental acuity    Blood pressure 107/44, pulse 72, temperature 98.6 F (37 C), temperature source Oral, resp. rate 18, height 5' 9.02" (1.753 m), weight 130.847 kg (288 lb 7.4 oz), last menstrual period 03/16/2015, SpO2 96 %.Body mass index is 42.58 kg/(m^2).  General Appearance: Casual  Eye Contact::  Minimal  Speech:  Slow and Slurred  Volume:  Decreased  Mood:  Euthymic  Affect:  Flat  Thought Process:  Negative  Orientation:  Full (Time, Place, and Person)  Thought Content:  Negative  Suicidal Thoughts:  No  Homicidal Thoughts:  No  Memory:  Negative  Judgement:  Negative  Insight:  Negative  Psychomotor Activity:  Psychomotor Retardation  Concentration:  Poor  Recall:  Poor  Fund of Knowledge:Fair  Language: Fair  Akathisia:  No  Handed:  Right  AIMS (if indicated):     Assets:  Desire for Improvement Financial Resources/Insurance Resilience Social Support  ADL's:  Impaired  Cognition: Impaired,  Moderate  Sleep:      Treatment Plan Summary: Medication management and Plan At first I had considered recommending that we not replace the medicine at all but given that the family clearly thinks that she has done more poorly off of Wellbutrin I can understand their urgency to try and make sure her mood does not get worse. My current evaluation is impaired by her mental state which prevents her from getting much of any history, the fact that the psychiatric history in the  hospital predates our records and I can't get access to any of her old hospitalization records, the multiple medical problems she currently has. I do note that one possible benefit is that having started Depakote to treat the seizures if she really has bipolar disorder that may help to stabilize her. In the end I am  going to suggest starting Abilify 2 mg a day. Like Wellbutrin and Abilify is a dopaminergic medication and it also has an indication for treating bipolar depression. There is a risk with antipsychotics of worsening sugar control but at the very low dose that should be minimized. Otherwise this medicine should be tolerated at the low dose. Case discussed with the patient and her sister and they agree to the plan. Orders written. I will follow up.  Disposition: Supportive therapy provided about ongoing stressors.  Alethia Berthold, MD 07/25/2015 6:39 PM

## 2015-07-25 NOTE — Care Management Note (Signed)
Patient is active on PD and is followed by [facility].  Admission records have been sent to center and additional records will be sent at discharge.  Ivor Reining [phone]

## 2015-07-25 NOTE — Progress Notes (Signed)
Daughter called up to the unit for an update, password provided. Daughter asking that compazine not been given as frequently as patient has been very drowsy with this medication; she requested we use zofran more often than compazine as she wants her mother to be more awake. Daughter also asked if PT could re-evaluate her mother and work with her again tomorrow. This RN empathized with these concerns and informed daughter these things would be passed on to next RN. Daughter stated thanks and understanding.

## 2015-07-25 NOTE — Progress Notes (Signed)
CT showed L thigh cellulitis, Dr. Juliene Pina paged and updated, states orders already in for abx. Patient also complaining of L thigh pain 7/10, but declined pain medication when offered. Will continue to reassess.

## 2015-07-25 NOTE — Care Management (Signed)
Met with patient and daughter at bedside to discuss discharge plan. Patient and daughter are both in agreement that they want patient to go home at discharge with home health when medically stable. Case discussed with Dr. Benjie Karvonen and she does not feel patient is ready to leave today. Patient very drowsy and sedated during conversation. Daughter states she has had many antiemetics. Patient has a referral CM made to [facility]. She will need a walker at discharge. Daughter plan to stay with patient at night. New PCP is Dr. Quita Skye. CM will need to reschedule initial appointment prior to discharge.

## 2015-07-25 NOTE — Progress Notes (Signed)
Subjective: No further seizure activity overnight. The patient reports that she has noted no further jerking.  Objective: Current vital signs: BP 107/44 mmHg  Pulse 72  Temp(Src) 98.6 F (37 C) (Oral)  Resp 18  Ht 5' 9.02" (1.753 m)  Wt 130.847 kg (288 lb 7.4 oz)  BMI 42.58 kg/m2  SpO2 96%  LMP 03/16/2015 Vital signs in last 24 hours: Temp:  [98 F (36.7 C)-98.6 F (37 C)] 98.6 F (37 C) (04/25 1108) Pulse Rate:  [67-75] 72 (04/25 1108) Resp:  [16-18] 18 (04/25 1108) BP: (97-129)/(41-48) 107/44 mmHg (04/25 1108) SpO2:  [92 %-96 %] 96 % (04/25 1108) Weight:  [130.847 kg (288 lb 7.4 oz)] 130.847 kg (288 lb 7.4 oz) (04/25 0423)  Intake/Output from previous day: 04/24 0701 - 04/25 0700 In: 3 [I.V.:3] Out: 766  Intake/Output this shift: Total I/O In: 120 [P.O.:120] Out: -  Nutritional status: Diet - low sodium heart healthy Diet - low sodium heart healthy Diet - low sodium heart healthy Diet Carb Modified Fluid consistency:: Thin; Room service appropriate?: Yes  Neurologic Exam: No myoclonus noted  Lab Results: Basic Metabolic Panel:  Recent Labs Lab 07/20/15 0547  07/21/15 0427 07/21/15 2150 07/23/15 0435 07/25/15 0454  NA  --   --  128* 126* 130* 129*  K  --   --  4.2 4.8 3.9 4.4  CL  --   --  91* 89* 91* 89*  CO2  --   --  22 20* 23 26  GLUCOSE  --   --  157* 106* 118* 91  BUN  --   --  62* 66* 61* 61*  CREATININE  --   --  9.89* 10.62* 10.25* 10.58*  CALCIUM  --   < > 5.9* 6.0* 6.1* 6.1*  MG  --   --  1.9  --   --   --   PHOS 11.7*  --   --   --   --   --   < > = values in this interval not displayed.  Liver Function Tests:  Recent Labs Lab 07/25/15 0454  AST 17  ALT 13*  ALKPHOS 83  BILITOT 0.6  PROT 5.1*  ALBUMIN 1.8*   No results for input(s): LIPASE, AMYLASE in the last 168 hours. No results for input(s): AMMONIA in the last 168 hours.  CBC:  Recent Labs Lab 07/21/15 0427 07/21/15 2150 07/23/15 0435 07/25/15 0454  WBC 9.5  14.5* 13.7* 10.8  NEUTROABS  --  11.9*  --   --   HGB 8.2* 8.1* 7.4* 7.8*  HCT 24.5* 24.6* 22.1* 23.2*  MCV 84.1 83.0 82.5 83.2  PLT 250 302 263 296    Cardiac Enzymes:  Recent Labs Lab 07/21/15 2150  TROPONINI <0.03    Lipid Panel: No results for input(s): CHOL, TRIG, HDL, CHOLHDL, VLDL, LDLCALC in the last 168 hours.  CBG:  Recent Labs Lab 07/24/15 0721 07/24/15 1131 07/24/15 1632 07/24/15 2053 07/25/15 0720  GLUCAP 104* 111* 128* 136* 92    Microbiology: Results for orders placed or performed during the hospital encounter of 07/15/15  Body fluid culture     Status: None   Collection Time: 07/15/15 10:23 PM  Result Value Ref Range Status   Specimen Description PERITONEAL  Final   Special Requests Normal  Final   Gram Stain RARE WBC SEEN NO ORGANISMS SEEN   Final   Culture No growth aerobically or anaerobically.  Final   Report Status 07/19/2015 FINAL  Final  Culture, blood (routine x 2)     Status: None   Collection Time: 07/16/15  3:58 AM  Result Value Ref Range Status   Specimen Description BLOOD RIGHT WRIST  Final   Special Requests   Final    BOTTLES DRAWN AEROBIC AND ANAEROBIC 12CCAERO,6CCANA   Culture NO GROWTH 5 DAYS  Final   Report Status 07/21/2015 FINAL  Final  Culture, blood (routine x 2)     Status: None   Collection Time: 07/16/15  3:58 AM  Result Value Ref Range Status   Specimen Description BLOOD RIGHT FOREARM  Final   Special Requests   Final    BOTTLES DRAWN AEROBIC AND ANAEROBIC 12CCAERO,2CCANA   Culture NO GROWTH 5 DAYS  Final   Report Status 07/21/2015 FINAL  Final  MRSA PCR Screening     Status: None   Collection Time: 07/21/15 10:45 PM  Result Value Ref Range Status   MRSA by PCR NEGATIVE NEGATIVE Final    Comment:        The GeneXpert MRSA Assay (FDA approved for NASAL specimens only), is one component of a comprehensive MRSA colonization surveillance program. It is not intended to diagnose MRSA infection nor to guide  or monitor treatment for MRSA infections.     Coagulation Studies: No results for input(s): LABPROT, INR in the last 72 hours.  Imaging: Dg Abd 1 View  07/24/2015  CLINICAL DATA:  Pt admitted since 4/15 with abdominal pain and nausea, worsening this morning; known h/o end-stage renal disease, on peritoneal dialysis. EXAM: ABDOMEN - 1 VIEW COMPARISON:  07/15/2015 FINDINGS: 3 supine views. Exam degraded by patient body habitus. Right hemidiaphragm elevation. Colonic stool burden suggests constipation. Gas-filled small bowel loops in the left-sided abdomen measure up to 2.7 cm, upper normal. Dialysis catheter terminating over the central pelvis. Minimal gas in the rectum. IMPRESSION: Possible constipation. Decreased sensitivity and specificity exam due to technique related factors, as described above. Electronically Signed   By: Jeronimo Greaves M.D.   On: 07/24/2015 11:59    Medications:  I have reviewed the patient's current medications. Scheduled: . antiseptic oral rinse  7 mL Mouth Rinse BID  . aspirin EC  81 mg Oral Daily  . calcium acetate  2,001 mg Oral TID WC  . calcium carbonate  1 tablet Oral TID WC  . carvedilol  6.25 mg Oral BID WC  . clopidogrel  75 mg Oral Daily  . dialysis solution 2.5% low-MG/low-CA   Intraperitoneal Q24H  . dialysis solution 2.5% low-MG/low-CA   Intraperitoneal Q24H  . divalproex  1,000 mg Oral BID  . docusate sodium  100 mg Oral BID  . erythromycin  250 mg Oral TID WC & HS  . ezetimibe  10 mg Oral QHS  . gentamicin cream  1 application Topical Daily  . heparin  5,000 Units Subcutaneous 3 times per day  . insulin aspart  0-5 Units Subcutaneous QHS  . insulin aspart  0-9 Units Subcutaneous TID WC  . isosorbide mononitrate  60 mg Oral Daily  . levETIRAcetam  750 mg Oral BID  . levofloxacin (LEVAQUIN) IV  500 mg Intravenous Q48H  . pantoprazole  40 mg Oral BID  . polyethylene glycol  17 g Oral Daily  . ranolazine  500 mg Oral BID  . sodium chloride flush   3 mL Intravenous Q12H  . [START ON 07/26/2015] sulfamethoxazole-trimethoprim  1 tablet Oral Q48H  . tuberculin  5 Units Intradermal Once    Assessment/Plan: Patient  without further seizure activity and is noting no further myoclonus. On Depakote and Keppra.  Hepatic function normal.    Recommendations: 1. Continue current anticonvulsant therapy at current doses.   LOS: 9 days   Thana Farr, MD Neurology [phone] 07/25/2015  11:37 AM

## 2015-07-26 LAB — VALPROIC ACID LEVEL: VALPROIC ACID LVL: 28 ug/mL — AB (ref 50.0–100.0)

## 2015-07-26 LAB — HEPATIC FUNCTION PANEL
ALBUMIN: 1.7 g/dL — AB (ref 3.5–5.0)
ALT: 11 U/L — ABNORMAL LOW (ref 14–54)
AST: 16 U/L (ref 15–41)
Alkaline Phosphatase: 84 U/L (ref 38–126)
BILIRUBIN TOTAL: 0.5 mg/dL (ref 0.3–1.2)
Bilirubin, Direct: <0.1 mg/dL — ABNORMAL LOW (ref 0.1–0.5)
TOTAL PROTEIN: 4.9 g/dL — AB (ref 6.5–8.1)

## 2015-07-26 LAB — CBC
HEMATOCRIT: 21.7 % — AB (ref 35.0–47.0)
HEMOGLOBIN: 7.3 g/dL — AB (ref 12.0–16.0)
MCH: 27.8 pg (ref 26.0–34.0)
MCHC: 33.4 g/dL (ref 32.0–36.0)
MCV: 83.2 fL (ref 80.0–100.0)
Platelets: 280 10*3/uL (ref 150–440)
RBC: 2.61 MIL/uL — AB (ref 3.80–5.20)
RDW: 13.5 % (ref 11.5–14.5)
WBC: 10.6 10*3/uL (ref 3.6–11.0)

## 2015-07-26 LAB — PHOSPHORUS: Phosphorus: 8.6 mg/dL — ABNORMAL HIGH (ref 2.5–4.6)

## 2015-07-26 LAB — GLUCOSE, CAPILLARY
GLUCOSE-CAPILLARY: 107 mg/dL — AB (ref 65–99)
GLUCOSE-CAPILLARY: 111 mg/dL — AB (ref 65–99)
Glucose-Capillary: 148 mg/dL — ABNORMAL HIGH (ref 65–99)
Glucose-Capillary: 101 mg/dL — ABNORMAL HIGH (ref 65–99)

## 2015-07-26 LAB — BASIC METABOLIC PANEL
Anion gap: 17 — ABNORMAL HIGH (ref 5–15)
BUN: 60 mg/dL — ABNORMAL HIGH (ref 6–20)
CHLORIDE: 87 mmol/L — AB (ref 101–111)
CO2: 23 mmol/L (ref 22–32)
Calcium: 6.2 mg/dL — CL (ref 8.9–10.3)
Creatinine, Ser: 10.85 mg/dL — ABNORMAL HIGH (ref 0.44–1.00)
GFR calc non Af Amer: 4 mL/min — ABNORMAL LOW (ref >60)
GFR, EST AFRICAN AMERICAN: 4 mL/min — AB (ref >60)
Glucose, Bld: 118 mg/dL — ABNORMAL HIGH (ref 65–99)
POTASSIUM: 4.4 mmol/L (ref 3.5–5.1)
SODIUM: 127 mmol/L — AB (ref 135–145)

## 2015-07-26 LAB — TSH: TSH: 4.144 u[IU]/mL (ref 0.350–4.500)

## 2015-07-26 MED ORDER — LEVETIRACETAM 500 MG PO TABS: 500.0000 mg | ORAL_TABLET | Freq: Two times a day (BID) | ORAL | Status: DC

## 2015-07-26 MED ORDER — LEVETIRACETAM 750 MG PO TABS: 750.0000 mg | ORAL_TABLET | Freq: Every day | ORAL | Status: DC

## 2015-07-26 MED ORDER — BISACODYL 10 MG RE SUPP
10.0000 mg | Freq: Once | RECTAL | Status: AC
  Administered 2015-07-26: 10 mg via RECTAL
  Filled 2015-07-26: qty 1

## 2015-07-26 MED ORDER — FLEET ENEMA 7-19 GM/118ML RE ENEM
1.0000 | ENEMA | Freq: Once | RECTAL | Status: AC
  Administered 2015-07-26: 1 via RECTAL

## 2015-07-26 NOTE — Consult Note (Signed)
[facility] Consult   Reason for Consult:  Consult for 50 year old woman with history of peritoneal dialysis currently in the hospital with parents tinnitus now has developed seizures as well. Referring Physician:  Mody Patient Identification: Kimberly Montgomery MRN:  [id number] Principal Diagnosis: Bipolar disorder Diagnosis:   Patient Active Problem List   Diagnosis Date Noted  . Bipolar I disorder ([place]) [F31.9] 07/25/2015  . Seizures ([place]) [R56.9] 07/25/2015  . Peritonitis ([place]) [K65.9] 07/16/2015  . Unstable angina ([place]) [I20.0] 07/09/2015  . ESRD on peritoneal dialysis ([place]) [N18.6, Z99.2] 07/09/2015  . Accelerated hypertension [I10] 07/09/2015  . Type 2 diabetes mellitus ([place]) [E11.9] 07/09/2015  . CAD (coronary artery disease) [I25.10] 07/09/2015  . HLD (hyperlipidemia) [E78.5] 07/09/2015  . GERD (gastroesophageal reflux disease) [K21.9] 07/09/2015  . Chest pain [R07.9] 05/12/2015    Total Time spent with patient: 30 minutes  Subjective:   Kimberly Montgomery is a 50 y.o. female patient admitted with patient is not currently able to give any history to speak of. Was admitted to the hospital with peritonitis has developed seizures multiple other medical problems..  Follow-up note on Wednesday the 26th. Patient continues to state that she is feeling confused. To my examination she appears clearly better than she was yesterday but still far off her baseline. She feels subjectively confused. She has a hard time explaining it. When I simplified things by asking her more closed and questions in getting her to  answer simple questions to keep her talking she was able to stay on target with it. She was able to name objects correctly and she was fully oriented. She seems to have a good understanding of what is actually going on with her. She is not feeling depressed there is no sign of mania or irritability or suicidal ideation.  HPI:  I will not try to summarize her entire  medical course but this is a 50 year old woman with a history of renal failure who was on peritoneal dialysis. Currently in the hospital for peritonitis has a history of diabetes coronary artery disease hyperlipidemia. Patient recently developed seizures. Neurology rightly recommended discontinuing Wellbutrin. Question about appropriate substitution. Patient was interviewed. Her sister was also present. Unfortunately the patient is currently so sedated she was unable to give much in the way of useful history. Sister was able to add a little but not a great deal. Patient evidently has a history of a diagnosis of bipolar disorder and has had psychiatric hospitalizations in the past most recently though about 12 years ago. Wellbutrin has been used for over 10 years and has been regarded as effective by the patient. It's reported that there've been times when she has self discontinued the medicine for one reason or another and her mood has become more labile. Currently the patient's affect is flat and she is unable to state any mood. No indication of any dangerousness or suicidality and I really can't assess her mood any further. Because of its clear association with increasing the risk of seizures it's appropriate to discontinue Wellbutrin at this point.    Past Psychiatric History: Evidently his past diagnosis of bipolar disorder. I wasn't able to get a clear description of her episodes except that it sounds like they've been mostly depression with some mood lability. Hasn't had a hospitalization in many years. They report that in the past she had tried Paxil which was not effective. They can't remember any other psychiatric medicine she has been on in the past.  Risk to  Self: Is patient at risk for suicide?: No Risk to Others:   Prior Inpatient Therapy:   Prior Outpatient Therapy:    Past Medical History:  Past Medical History  Diagnosis Date  . Renal disorder   . Hypertension   . CHF (congestive  heart failure) ([place])   . Diabetes mellitus without complication ([place])   . Gastroparesis   . Asthma   . ESRD (end stage renal disease) ([place])   . Endometriosis   . CAD (coronary artery disease)   . HLD (hyperlipidemia)   . GERD (gastroesophageal reflux disease)     Past Surgical History  Procedure Laterality Date  . Hysterotomy    . Below knee leg amputation    . Pd tube inserstion    . Hd fistula surgery with reversal    . Cardiac catheterization Right 07/10/2015    Procedure: Left Heart Cath and Coronary Angiography;  Surgeon: Dionisio David, MD;  Location: [facility];  Service: Cardiovascular;  Laterality: Right;   Family History:  Family History  Problem Relation Age of Onset  . CAD    . Diabetes    . Bipolar disorder    . Cervical cancer Mother    Family Psychiatric  History: Family history is not reported Social History:  History  Alcohol Use No     History  Drug Use No    Social History   Social History  . Marital Status: Widowed    Spouse Name: N/A  . Number of Children: N/A  . Years of Education: N/A   Occupational History  . disabled    Social History Main Topics  . Smoking status: Current Every Day Smoker -- 0.50 packs/day    Types: Cigarettes  . Smokeless tobacco: None  . Alcohol Use: No  . Drug Use: No  . Sexual Activity: No   Other Topics Concern  . None   Social History Narrative   Additional Social History:    Allergies:   Allergies  Allergen Reactions  . Cephalosporins Anaphylaxis  . Penicillins Anaphylaxis and Other (See Comments)    Has patient had a PCN reaction causing immediate rash, facial/tongue/throat swelling, SOB or lightheadedness with hypotension: Yes Has patient had a PCN reaction causing severe rash involving mucus membranes or skin necrosis: No Has patient had a PCN reaction that required hospitalization No Has patient had a PCN reaction occurring within the last 10 years: No If all of the above answers are  "NO", then may proceed with Cephalosporin use.  [name] Phenergan [Promethazine Hcl] Nausea And Vomiting    Labs:  Results for orders placed or performed during the hospital encounter of 07/15/15 (from the past 48 hour(s))  Glucose, capillary     Status: Abnormal   Collection Time: 07/24/15  8:53 PM  Result Value Ref Range   Glucose-Capillary 136 (H) 65 - 99 mg/dL  CBC     Status: Abnormal   Collection Time: 07/25/15  4:54 AM  Result Value Ref Range   WBC 10.8 3.6 - 11.0 K/uL   RBC 2.79 (L) 3.80 - 5.20 MIL/uL   Hemoglobin 7.8 (L) 12.0 - 16.0 g/dL   HCT 23.2 (L) 35.0 - 47.0 %   MCV 83.2 80.0 - 100.0 fL   MCH 28.1 26.0 - 34.0 pg   MCHC 33.7 32.0 - 36.0 g/dL   RDW 13.6 11.5 - 14.5 %   Platelets 296 150 - 440 K/uL  Comprehensive metabolic panel     Status: Abnormal  Collection Time: 07/25/15  4:54 AM  Result Value Ref Range   Sodium 129 (L) 135 - 145 mmol/L   Potassium 4.4 3.5 - 5.1 mmol/L   Chloride 89 (L) 101 - 111 mmol/L   CO2 26 22 - 32 mmol/L   Glucose, Bld 91 65 - 99 mg/dL   BUN 61 (H) 6 - 20 mg/dL   Creatinine, Ser 10.58 (H) 0.44 - 1.00 mg/dL   Calcium 6.1 (LL) 8.9 - 10.3 mg/dL    Comment: CRITICAL RESULT CALLED TO, READ BACK BY AND VERIFIED WITH CALLED KATHY STEWART '@0607'  ON 07/25/15 BY HKP    Total Protein 5.1 (L) 6.5 - 8.1 g/dL   Albumin 1.8 (L) 3.5 - 5.0 g/dL   AST 17 15 - 41 U/L   ALT 13 (L) 14 - 54 U/L   Alkaline Phosphatase 83 38 - 126 U/L   Total Bilirubin 0.6 0.3 - 1.2 mg/dL   GFR calc non Af Amer 4 (L) >60 mL/min   GFR calc Af Amer 4 (L) >60 mL/min    Comment: (NOTE) The eGFR has been calculated using the CKD EPI equation. This calculation has not been validated in all clinical situations. eGFR's persistently <60 mL/min signify possible Chronic Kidney Disease.    Anion gap 14 5 - 15  Vancomycin, random     Status: None   Collection Time: 07/25/15  4:54 AM  Result Value Ref Range   Vancomycin Rm 20 ug/mL    Comment:        Random Vancomycin  therapeutic range is dependent on dosage and time of specimen collection. A peak range is 20.0-40.0 ug/mL A trough range is 5.0-15.0 ug/mL          Glucose, capillary     Status: None   Collection Time: 07/25/15  7:20 AM  Result Value Ref Range   Glucose-Capillary 92 65 - 99 mg/dL  Body fluid cell count with differential     Status: Abnormal   Collection Time: 07/25/15  9:17 AM  Result Value Ref Range   Fluid Type-FCT PERITONEAL DIALYSIS     Comment: CORRECTED ON 04/25 AT 0954: PREVIOUSLY REPORTED AS Body Fluid   Color, Fluid COLORLESS (A) YELLOW   Appearance, Fluid CLEAR (A) CLEAR   WBC, Fluid 21 cu mm   Neutrophil Count, Fluid 24 %   Lymphs, Fluid 50 %   Monocyte-Macrophage-Serous Fluid 26 %  Pathologist smear review     Status: None   Collection Time: 07/25/15  9:17 AM  Result Value Ref Range   Path Review      Smear of peritoneal dialysate reviewed. Negative for malignancy. Absolute neutrophil count 5 neutrophils per cubic mL.    Comment: Reviewed by Dellia Nims Rubinas, M.D.  Glucose, capillary     Status: None   Collection Time: 07/25/15 11:10 AM  Result Value Ref Range   Glucose-Capillary 99 65 - 99 mg/dL  Glucose, capillary     Status: Abnormal   Collection Time: 07/25/15  4:17 PM  Result Value Ref Range   Glucose-Capillary 210 (H) 65 - 99 mg/dL  Glucose, capillary     Status: None   Collection Time: 07/25/15  9:02 PM  Result Value Ref Range   Glucose-Capillary 70 65 - 99 mg/dL   Comment 1 Notify RN   Glucose, capillary     Status: Abnormal   Collection Time: 07/25/15 10:26 PM  Result Value Ref Range   Glucose-Capillary 129 (H) 65 - 99 mg/dL  Comment 1 Notify RN   Basic metabolic panel     Status: Abnormal   Collection Time: 07/26/15  3:57 AM  Result Value Ref Range   Sodium 127 (L) 135 - 145 mmol/L   Potassium 4.4 3.5 - 5.1 mmol/L   Chloride 87 (L) 101 - 111 mmol/L   CO2 23 22 - 32 mmol/L   Glucose, Bld 118 (H) 65 - 99 mg/dL   BUN 60 (H) 6 - 20 mg/dL    Creatinine, Ser 10.85 (H) 0.44 - 1.00 mg/dL   Calcium 6.2 (LL) 8.9 - 10.3 mg/dL    Comment: CRITICAL RESULT CALLED TO, READ BACK BY AND VERIFIED WITH KATHY STEWART @ 0511 ON 07/26/2015 BY CAF    GFR calc non Af Amer 4 (L) >60 mL/min   GFR calc Af Amer 4 (L) >60 mL/min    Comment: (NOTE) The eGFR has been calculated using the CKD EPI equation. This calculation has not been validated in all clinical situations. eGFR's persistently <60 mL/min signify possible Chronic Kidney Disease.    Anion gap 17 (H) 5 - 15  CBC     Status: Abnormal   Collection Time: 07/26/15  3:57 AM  Result Value Ref Range   WBC 10.6 3.6 - 11.0 K/uL   RBC 2.61 (L) 3.80 - 5.20 MIL/uL   Hemoglobin 7.3 (L) 12.0 - 16.0 g/dL   HCT 21.7 (L) 35.0 - 47.0 %   MCV 83.2 80.0 - 100.0 fL   MCH 27.8 26.0 - 34.0 pg   MCHC 33.4 32.0 - 36.0 g/dL   RDW 13.5 11.5 - 14.5 %   Platelets 280 150 - 440 K/uL  Phosphorus     Status: Abnormal   Collection Time: 07/26/15  3:57 AM  Result Value Ref Range   Phosphorus 8.6 (H) 2.5 - 4.6 mg/dL  TSH     Status: None   Collection Time: 07/26/15  3:57 AM  Result Value Ref Range   TSH 4.144 0.350 - 4.500 uIU/mL  Glucose, capillary     Status: Abnormal   Collection Time: 07/26/15  7:27 AM  Result Value Ref Range   Glucose-Capillary 148 (H) 65 - 99 mg/dL  Glucose, capillary     Status: Abnormal   Collection Time: 07/26/15 11:18 AM  Result Value Ref Range   Glucose-Capillary 107 (H) 65 - 99 mg/dL  Valproic acid level     Status: Abnormal   Collection Time: 07/26/15  2:49 PM  Result Value Ref Range   Valproic Acid Lvl 28 (L) 50.0 - 100.0 ug/mL  Hepatic function panel     Status: Abnormal   Collection Time: 07/26/15  2:49 PM  Result Value Ref Range   Total Protein 4.9 (L) 6.5 - 8.1 g/dL   Albumin 1.7 (L) 3.5 - 5.0 g/dL   AST 16 15 - 41 U/L   ALT 11 (L) 14 - 54 U/L   Alkaline Phosphatase 84 38 - 126 U/L   Total Bilirubin 0.5 0.3 - 1.2 mg/dL   Bilirubin, Direct <0.1 (L) 0.1 - 0.5 mg/dL    Indirect Bilirubin NOT CALCULATED 0.3 - 0.9 mg/dL  Glucose, capillary     Status: Abnormal   Collection Time: 07/26/15  4:38 PM  Result Value Ref Range   Glucose-Capillary 111 (H) 65 - 99 mg/dL    Current Facility-Administered Medications  Medication Dose Route Frequency Provider Last Rate Last Dose  . 0.9 %  sodium chloride infusion  250 mL Intravenous PRN Saundra Shelling, MD      .  acetaminophen (TYLENOL) tablet 650 mg  650 mg Oral Q6H PRN Saundra Shelling, MD   650 mg at 07/24/15 0128   Or  . acetaminophen (TYLENOL) suppository 650 mg  650 mg Rectal Q6H PRN Pavan Pyreddy, MD      . albuterol (PROVENTIL) (2.5 MG/3ML) 0.083% nebulizer solution 2.5 mg  2.5 mg Nebulization Q6H PRN Saundra Shelling, MD      . antiseptic oral rinse (CPC / CETYLPYRIDINIUM CHLORIDE 0.05%) solution 7 mL  7 mL Mouth Rinse BID Bettey Costa, MD   7 mL at 07/23/15 2315  . ARIPiprazole (ABILIFY) tablet 2 mg  2 mg Oral Daily Gonzella Lex, MD   2 mg at 07/26/15 1100  . aspirin EC tablet 81 mg  81 mg Oral Daily Pavan Pyreddy, MD   81 mg at 07/26/15 1059  . calcium acetate (PHOSLO) capsule 2,001 mg  2,001 mg Oral TID WC Lavonia Dana, MD   2,001 mg at 07/26/15 1236  . calcium carbonate (TUMS - dosed in mg elemental calcium) chewable tablet 200 mg of elemental calcium  1 tablet Oral TID WC Harrie Foreman, MD   200 mg of elemental calcium at 07/26/15 1236  . carvedilol (COREG) tablet 6.25 mg  6.25 mg Oral BID WC Saundra Shelling, MD   6.25 mg at 07/26/15 0819  . clopidogrel (PLAVIX) tablet 75 mg  75 mg Oral Daily Saundra Shelling, MD   75 mg at 07/26/15 1059  . dialysis solution 2.5% low-MG/low-CA dianeal solution   Intraperitoneal Q24H Harmeet Singh, MD      . dialysis solution 2.5% low-MG/low-CA dianeal solution   Intraperitoneal Q24H Lavonia Dana, MD   9 L at 07/25/15 1145  . divalproex (DEPAKOTE) DR tablet 1,000 mg  1,000 mg Oral BID Alexis Goodell, MD   1,000 mg at 07/26/15 1100  . docusate sodium (COLACE) capsule 100 mg   100 mg Oral QHS PRN Saundra Shelling, MD   100 mg at 07/23/15 2121  . docusate sodium (COLACE) capsule 100 mg  100 mg Oral BID Bettey Costa, MD   100 mg at 07/26/15 1059  . erythromycin (ERY-TAB) EC tablet 250 mg  250 mg Oral TID WC & HS Bettey Costa, MD   250 mg at 07/26/15 1236  . ezetimibe (ZETIA) tablet 10 mg  10 mg Oral QHS Saundra Shelling, MD   10 mg at 07/25/15 2113  . gentamicin cream (GARAMYCIN) 0.1 % 1 application  1 application Topical Daily Harmeet Singh, MD   1 application at 33/82/50 1726  . heparin 1000 unit/ml injection 500 Units  500 Units Intraperitoneal PRN Harmeet Singh, MD      . heparin injection 5,000 Units  5,000 Units Subcutaneous 3 times per day Saundra Shelling, MD   5,000 Units at 07/26/15 1513  . insulin aspart (novoLOG) injection 0-5 Units  0-5 Units Subcutaneous QHS Nicholes Mango, MD   2 Units at 07/23/15 2134  . insulin aspart (novoLOG) injection 0-9 Units  0-9 Units Subcutaneous TID WC Nicholes Mango, MD   1 Units at 07/26/15 0819  . isosorbide mononitrate (IMDUR) 24 hr tablet 60 mg  60 mg Oral Daily Pavan Pyreddy, MD   60 mg at 07/26/15 1059  . lactulose (CHRONULAC) 10 GM/15ML solution 30 g  30 g Oral BID PRN Murlean Iba, MD      . Derrill Memo ON 07/27/2015] levETIRAcetam (KEPPRA) tablet 750 mg  750 mg Oral Daily Alexis Goodell, MD      . LORazepam (ATIVAN) injection 1 mg  1 mg Intravenous Q2H PRN Srikar Sudini, MD      . ondansetron [facility]) tablet 2 mg  2 mg Oral Q6H PRN Fritzi Mandes, MD   2 mg at 07/23/15 2118   Or  . ondansetron (ZOFRAN) injection 2 mg  2 mg Intravenous Q6H PRN Fritzi Mandes, MD   2 mg at 07/26/15 5643  . oxyCODONE-acetaminophen (PERCOCET/ROXICET) 5-325 MG per tablet 1-2 tablet  1-2 tablet Oral Q6H PRN Fritzi Mandes, MD   2 tablet at 07/24/15 0510  . pantoprazole (PROTONIX) EC tablet 40 mg  40 mg Oral BID Hulen Luster, MD   40 mg at 07/26/15 0818  . polyethylene glycol (MIRALAX / GLYCOLAX) packet 17 g  17 g Oral Daily PRN Saundra Shelling, MD   17 g at 07/23/15 1740  .  polyethylene glycol (MIRALAX / GLYCOLAX) packet 17 g  17 g Oral Daily Sital Mody, MD   17 g at 07/26/15 1101  . prochlorperazine (COMPAZINE) injection 5 mg  5 mg Intravenous Q6H PRN Nicholes Mango, MD   5 mg at 07/25/15 1346  . ranolazine (RANEXA) 12 hr tablet 500 mg  500 mg Oral BID Saundra Shelling, MD   500 mg at 07/26/15 1059  . sodium chloride flush (NS) 0.9 % injection 3 mL  3 mL Intravenous Q12H Pavan Pyreddy, MD   3 mL at 07/26/15 1101  . sodium chloride flush (NS) 0.9 % injection 3 mL  3 mL Intravenous PRN Saundra Shelling, MD   3 mL at 07/19/15 0206  . sulfamethoxazole-trimethoprim (BACTRIM DS,SEPTRA DS) 800-160 MG per tablet 1 tablet  1 tablet Oral Q48H Crystal G Scarpena, RPH      . tuberculin injection 5 Units  5 Units Intradermal Once Fritzi Mandes, MD   5 Units at 07/23/15 1509    Musculoskeletal: Strength & Muscle Tone: decreased Gait & Station: unable to stand Patient leans: N/A  Psychiatric Specialty Exam: Review of Systems  HENT: Negative.   Eyes: Negative.   Respiratory: Negative.   Cardiovascular: Negative.   Gastrointestinal: Negative.   Musculoskeletal: Negative.   Skin: Negative.   Neurological: Positive for speech change and weakness.  Psychiatric/Behavioral: Positive for memory loss. Negative for depression, suicidal ideas, hallucinations and substance abuse. The patient is not nervous/anxious and does not have insomnia.     Blood pressure 123/53, pulse 69, temperature 98.1 F (36.7 C), temperature source Oral, resp. rate 18, height 5' 9.02" (1.753 m), weight 129.003 kg (284 lb 6.4 oz), last menstrual period 03/16/2015, SpO2 96 %.Body mass index is 41.98 kg/(m^2).  General Appearance: Casual  Eye Contact::  Minimal  Speech:  Slow and Slurred  Volume:  Decreased  Mood:  Euthymic  Affect:  Flat  Thought Process:  Negative  Orientation:  Full (Time, Place, and Person)  Thought Content:  Negative  Suicidal Thoughts:  No  Homicidal Thoughts:  No  Memory:  Negative   Judgement:  Negative  Insight:  Negative  Psychomotor Activity:  Psychomotor Retardation  Concentration:  Poor  Recall:  Poor  Fund of Knowledge:Fair  Language: Fair  Akathisia:  No  Handed:  Right  AIMS (if indicated):     Assets:  Desire for Improvement Financial Resources/Insurance Resilience Social Support  ADL's:  Impaired  Cognition: Impaired,  Moderate  Sleep:      Treatment Plan Summary: Medication management and Plan At least so far she is tolerating the discontinuation of bupropion and starting 2 mg a day of Abilify. I reviewed with her again  the rationale for the change and side effects of the medicine. She is understanding and agreeable. I will continue the current medicine for now and follow-up as needed.  Disposition: Supportive therapy provided about ongoing stressors.  Alethia Berthold, MD 07/26/2015 5:03 PM

## 2015-07-26 NOTE — Progress Notes (Signed)
Attempted to get patient up in chair. While sitting EOB patient had some vomiting with movement. Zofran 2mg  IV given. MD made aware. Per MD okay to order fleet enema once, dulcolax suppository once, PT consult and WOC consult. Will continue to monitor. Rudean Haskell

## 2015-07-26 NOTE — Progress Notes (Signed)
Subjective:     Poor PO intake. Patient reports vomiting this AM and last night + peripheral edema, especially in thigh region PD went OK  Objective:  Vital signs in last 24 hours:  Temp:  [97.5 F (36.4 C)-98.5 F (36.9 C)] 98.1 F (36.7 C) (04/26 1116) Pulse Rate:  [68-70] 69 (04/26 1116) Resp:  [18-20] 18 (04/26 1116) BP: (110-128)/(45-60) 123/53 mmHg (04/26 1116) SpO2:  [91 %-96 %] 96 % (04/26 1116)  Weight change:  Filed Weights   07/23/15 0500 07/24/15 0500 07/25/15 0423  Weight: 127.6 kg (281 lb 4.9 oz) 131.4 kg (289 lb 11 oz) 130.847 kg (288 lb 7.4 oz)    Intake/Output:    Intake/Output Summary (Last 24 hours) at 07/26/15 1621 Last data filed at 07/26/15 1130  Gross per 24 hour  Intake    360 ml  Output      0 ml  Net    360 ml     Physical Exam: General: NAD  HEENT Anicteric, moist oral mucous membranes  Neck supple  Pulm/lungs Normal effort, clear to auscultation bilaterally  CVS/Heart No rub or gallop  Abdomen:  Soft, nontender  Extremities: + dependent peripheral edema, right BKA  Neurologic:   Lethargic but able to answer questions  Skin: No acute rashes  Access: PD catheter, exit site nontender and clean       Basic Metabolic Panel:   Recent Labs Lab 07/20/15 0547  07/21/15 0427 07/21/15 2150 07/23/15 0435 07/25/15 0454 07/26/15 0357  NA  --   --  128* 126* 130* 129* 127*  K  --   --  4.2 4.8 3.9 4.4 4.4  CL  --   --  91* 89* 91* 89* 87*  CO2  --   --  22 20* GLUCOSE  --   --  157* 106* 118* 91 118*  BUN  --   --  62* 66* 61* 61* 60*  CREATININE  --   --  9.89* 10.62* 10.25* 10.58* 10.85*  CALCIUM  --   < > 5.9* 6.0* 6.1* 6.1* 6.2*  MG  --   --  1.9  --   --   --   --   PHOS 11.7*  --   --   --   --   --  8.6*  < > = values in this interval not displayed.   CBC:  Recent Labs Lab 07/21/15 0427 07/21/15 2150 07/23/15 0435 07/25/15 0454 07/26/15 0357  WBC 9.5 14.5* 13.7* 10.8 10.6  NEUTROABS  --  11.9*  --   --    --   HGB 8.2* 8.1* 7.4* 7.8* 7.3*  HCT 24.5* 24.6* 22.1* 23.2* 21.7*  MCV 84.1 83.0 82.5 83.2 83.2  PLT 250 302 263 296 280      Microbiology:  Recent Results (from the past 720 hour(s))  Body fluid culture     Status: None   Collection Time: 07/15/15 10:23 PM  Result Value Ref Range Status   Specimen Description PERITONEAL  Final   Special Requests Normal  Final   Gram Stain RARE WBC SEEN NO ORGANISMS SEEN   Final   Culture No growth aerobically or anaerobically.  Final   Report Status 07/19/2015 FINAL  Final  Culture, blood (routine x 2)     Status: None   Collection Time: 07/16/15  3:58 AM  Result Value Ref Range Status   Specimen Description BLOOD RIGHT WRIST  Final   Special  Requests   Final    BOTTLES DRAWN AEROBIC AND ANAEROBIC 12CCAERO,6CCANA   Culture NO GROWTH 5 DAYS  Final   Report Status 07/21/2015 FINAL  Final  Culture, blood (routine x 2)     Status: None   Collection Time: 07/16/15  3:58 AM  Result Value Ref Range Status   Specimen Description BLOOD RIGHT FOREARM  Final   Special Requests   Final    BOTTLES DRAWN AEROBIC AND ANAEROBIC 12CCAERO,2CCANA   Culture NO GROWTH 5 DAYS  Final   Report Status 07/21/2015 FINAL  Final  MRSA PCR Screening     Status: None   Collection Time: 07/21/15 10:45 PM  Result Value Ref Range Status   MRSA by PCR NEGATIVE NEGATIVE Final    Comment:        The GeneXpert MRSA Assay (FDA approved for NASAL specimens only), is one component of a comprehensive MRSA colonization surveillance program. It is not intended to diagnose MRSA infection nor to guide or monitor treatment for MRSA infections.     Coagulation Studies: No results for input(s): LABPROT, INR in the last 72 hours.  Urinalysis: No results for input(s): COLORURINE, LABSPEC, PHURINE, GLUCOSEU, HGBUR, BILIRUBINUR, KETONESUR, PROTEINUR, UROBILINOGEN, NITRITE, LEUKOCYTESUR in the last 72 hours.  Invalid input(s): APPERANCEUR    Imaging: Ct Femur Left W  Contrast  07/25/2015  CLINICAL DATA:  Cellulitis of the left thigh.  Left thigh pain. EXAM: CT OF THE LOWER LEFT EXTREMITY WITHOUT CONTRAST TECHNIQUE: Multidetector CT imaging of the left femur was performed according to the standard protocol. COMPARISON:  None. FINDINGS: There is no acute fracture or dislocation. There is no lytic or sclerotic osseous lesion. There is no periosteal reaction or bone destruction. There is lateral patellar tilting. The hip joint space is maintained. The muscles are normal. There is no muscle atrophy. There is no intramuscular fluid collection or hematoma. There is peripheral vascular atherosclerotic disease. There is soft tissue edema diffusely throughout the left thigh subcutaneous fat most severe along the lateral aspect consistent with cellulitis. There is no soft tissue emphysema. There is no focal fluid collection. IMPRESSION: 1. Left thigh cellulitis. No focal fluid collection to suggest abscess. Electronically Signed   By: Elige Ko   On: 07/25/2015 16:40     Medications:     . antiseptic oral rinse  7 mL Mouth Rinse BID  . ARIPiprazole  2 mg Oral Daily  . aspirin EC  81 mg Oral Daily  . calcium acetate  2,001 mg Oral TID WC  . calcium carbonate  1 tablet Oral TID WC  . carvedilol  6.25 mg Oral BID WC  . clopidogrel  75 mg Oral Daily  . dialysis solution 2.5% low-MG/low-CA   Intraperitoneal Q24H  . dialysis solution 2.5% low-MG/low-CA   Intraperitoneal Q24H  . divalproex  1,000 mg Oral BID  . docusate sodium  100 mg Oral BID  . erythromycin  250 mg Oral TID WC & HS  . ezetimibe  10 mg Oral QHS  . gentamicin cream  1 application Topical Daily  . heparin  5,000 Units Subcutaneous 3 times per day  . insulin aspart  0-5 Units Subcutaneous QHS  . insulin aspart  0-9 Units Subcutaneous TID WC  . isosorbide mononitrate  60 mg Oral Daily  . [START ON 07/27/2015] levETIRAcetam  750 mg Oral Daily  . pantoprazole  40 mg Oral BID  . polyethylene glycol  17 g  Oral Daily  . ranolazine  500 mg  Oral BID  . sodium chloride flush  3 mL Intravenous Q12H  . sulfamethoxazole-trimethoprim  1 tablet Oral Q48H  . tuberculin  5 Units Intradermal Once   sodium chloride, acetaminophen **OR** acetaminophen, albuterol, docusate sodium, heparin, lactulose, LORazepam, ondansetron **OR** ondansetron (ZOFRAN) IV, oxyCODONE-acetaminophen, polyethylene glycol, prochlorperazine, sodium chloride flush  Assessment/ Plan:  50 y.o. white  female with past medical history of end-stage renal disease on peritoneal dialysis, asthma, gastroparesis, diabetes mellitus, congestive heart failure, hypertension, endometriosis, coronary atherosclerosis, medical management  [facility]/[address]/peritoneal dialysis  1. End-stage renal disease on peritoneal dialysis.  4 exchanges 10 hours with 2.2 litre fills. 2.5% dextrose, 4.25 %  - nightly PD - UF 1200 last night   2. Acute peritonitis in PD patient. WBC cell count 690 with 73% neutrophils on admission.  - PD fluid culture is no growth - Empiric treatment with broad-spectrum antibiotics vancomycin (started 4/16- most recent dose 4/22) and Levaquin.   - VANC level 20 on 4/25  3. Anemia chronic kidney disease. - epo as outpatient. Holding now with neurologic changes.   4. Secondary hyperparathyroidism. phos elevated at 11.7 on 4/20.- > 8.6 today - calcium acetate with meals.      LOS: 10 Shawntel Farnworth 4/26/20174:21 PM

## 2015-07-26 NOTE — Progress Notes (Signed)
Subjective: Although awake and alert when I evaluate the patient it appears that she has been quite lethargic.  No further myoclonic activity noted.    Objective: Current vital signs: BP 123/53 mmHg  Pulse 69  Temp(Src) 98.1 F (36.7 C) (Oral)  Resp 18  Ht 5' 9.02" (1.753 m)  Wt 130.847 kg (288 lb 7.4 oz)  BMI 42.58 kg/m2  SpO2 96%  LMP 03/16/2015 Vital signs in last 24 hours: Temp:  [97.5 F (36.4 C)-98.5 F (36.9 C)] 98.1 F (36.7 C) (04/26 1116) Pulse Rate:  [68-70] 69 (04/26 1116) Resp:  [18-20] 18 (04/26 1116) BP: (110-128)/(45-60) 123/53 mmHg (04/26 1116) SpO2:  [91 %-96 %] 96 % (04/26 1116)  Intake/Output from previous day: 04/25 0701 - 04/26 0700 In: 360 [P.O.:360] Out: 1267  Intake/Output this shift: Total I/O In: 360 [P.O.:360] Out: -  Nutritional status: Diet - low sodium heart healthy Diet - low sodium heart healthy Diet - low sodium heart healthy Diet Carb Modified Fluid consistency:: Thin; Room service appropriate?: Yes  Neurologic Exam: No myoclonus noted  Lab Results: Basic Metabolic Panel:  Recent Labs Lab 07/20/15 0547  07/21/15 0427 07/21/15 2150 07/23/15 0435 07/25/15 0454 07/26/15 0357  NA  --   --  128* 126* 130* 129* 127*  K  --   --  4.2 4.8 3.9 4.4 4.4  CL  --   --  91* 89* 91* 89* 87*  CO2  --   --  22 20* 23 26 23   GLUCOSE  --   --  157* 106* 118* 91 118*  BUN  --   --  62* 66* 61* 61* 60*  CREATININE  --   --  9.89* 10.62* 10.25* 10.58* 10.85*  CALCIUM  --   < > 5.9* 6.0* 6.1* 6.1* 6.2*  MG  --   --  1.9  --   --   --   --   PHOS 11.7*  --   --   --   --   --  8.6*  < > = values in this interval not displayed.  Liver Function Tests:  Recent Labs Lab 07/25/15 0454  AST 17  ALT 13*  ALKPHOS 83  BILITOT 0.6  PROT 5.1*  ALBUMIN 1.8*   No results for input(s): LIPASE, AMYLASE in the last 168 hours. No results for input(s): AMMONIA in the last 168 hours.  CBC:  Recent Labs Lab 07/21/15 0427 07/21/15 2150  07/23/15 0435 07/25/15 0454 07/26/15 0357  WBC 9.5 14.5* 13.7* 10.8 10.6  NEUTROABS  --  11.9*  --   --   --   HGB 8.2* 8.1* 7.4* 7.8* 7.3*  HCT 24.5* 24.6* 22.1* 23.2* 21.7*  MCV 84.1 83.0 82.5 83.2 83.2  PLT 250 302 263 296 280    Cardiac Enzymes:  Recent Labs Lab 07/21/15 2150  TROPONINI <0.03    Lipid Panel: No results for input(s): CHOL, TRIG, HDL, CHOLHDL, VLDL, LDLCALC in the last 168 hours.  CBG:  Recent Labs Lab 07/25/15 1617 07/25/15 2102 07/25/15 2226 07/26/15 0727 07/26/15 1118  GLUCAP 210* 70 129* 148* 107*    Microbiology: Results for orders placed or performed during the hospital encounter of 07/15/15  Body fluid culture     Status: None   Collection Time: 07/15/15 10:23 PM  Result Value Ref Range Status   Specimen Description PERITONEAL  Final   Special Requests Normal  Final   Gram Stain RARE WBC SEEN NO ORGANISMS SEEN  Final   Culture No growth aerobically or anaerobically.  Final   Report Status 07/19/2015 FINAL  Final  Culture, blood (routine x 2)     Status: None   Collection Time: 07/16/15  3:58 AM  Result Value Ref Range Status   Specimen Description BLOOD RIGHT WRIST  Final   Special Requests   Final    BOTTLES DRAWN AEROBIC AND ANAEROBIC 12CCAERO,6CCANA   Culture NO GROWTH 5 DAYS  Final   Report Status 07/21/2015 FINAL  Final  Culture, blood (routine x 2)     Status: None   Collection Time: 07/16/15  3:58 AM  Result Value Ref Range Status   Specimen Description BLOOD RIGHT FOREARM  Final   Special Requests   Final    BOTTLES DRAWN AEROBIC AND ANAEROBIC 12CCAERO,2CCANA   Culture NO GROWTH 5 DAYS  Final   Report Status 07/21/2015 FINAL  Final  MRSA PCR Screening     Status: None   Collection Time: 07/21/15 10:45 PM  Result Value Ref Range Status   MRSA by PCR NEGATIVE NEGATIVE Final    Comment:        The GeneXpert MRSA Assay (FDA approved for NASAL specimens only), is one component of a comprehensive MRSA  colonization surveillance program. It is not intended to diagnose MRSA infection nor to guide or monitor treatment for MRSA infections.     Coagulation Studies: No results for input(s): LABPROT, INR in the last 72 hours.  Imaging: Ct Femur Left W Contrast  07/25/2015  CLINICAL DATA:  Cellulitis of the left thigh.  Left thigh pain. EXAM: CT OF THE LOWER LEFT EXTREMITY WITHOUT CONTRAST TECHNIQUE: Multidetector CT imaging of the left femur was performed according to the standard protocol. COMPARISON:  None. FINDINGS: There is no acute fracture or dislocation. There is no lytic or sclerotic osseous lesion. There is no periosteal reaction or bone destruction. There is lateral patellar tilting. The hip joint space is maintained. The muscles are normal. There is no muscle atrophy. There is no intramuscular fluid collection or hematoma. There is peripheral vascular atherosclerotic disease. There is soft tissue edema diffusely throughout the left thigh subcutaneous fat most severe along the lateral aspect consistent with cellulitis. There is no soft tissue emphysema. There is no focal fluid collection. IMPRESSION: 1. Left thigh cellulitis. No focal fluid collection to suggest abscess. Electronically Signed   By: Elige Ko   On: 07/25/2015 16:40    Medications:  I have reviewed the patient's current medications. Scheduled: . antiseptic oral rinse  7 mL Mouth Rinse BID  . ARIPiprazole  2 mg Oral Daily  . aspirin EC  81 mg Oral Daily  . calcium acetate  2,001 mg Oral TID WC  . calcium carbonate  1 tablet Oral TID WC  . carvedilol  6.25 mg Oral BID WC  . clopidogrel  75 mg Oral Daily  . dialysis solution 2.5% low-MG/low-CA   Intraperitoneal Q24H  . dialysis solution 2.5% low-MG/low-CA   Intraperitoneal Q24H  . divalproex  1,000 mg Oral BID  . docusate sodium  100 mg Oral BID  . erythromycin  250 mg Oral TID WC & HS  . ezetimibe  10 mg Oral QHS  . gentamicin cream  1 application Topical Daily   . heparin  5,000 Units Subcutaneous 3 times per day  . insulin aspart  0-5 Units Subcutaneous QHS  . insulin aspart  0-9 Units Subcutaneous TID WC  . isosorbide mononitrate  60 mg Oral Daily  . [  START ON 07/27/2015] levETIRAcetam  750 mg Oral Daily  . pantoprazole  40 mg Oral BID  . polyethylene glycol  17 g Oral Daily  . ranolazine  500 mg Oral BID  . sodium chloride flush  3 mL Intravenous Q12H  . sulfamethoxazole-trimethoprim  1 tablet Oral Q48H  . tuberculin  5 Units Intradermal Once    Assessment/Plan: Concern is for lethargy being related to anticonvulsant therapy.  Patient did require these doses early on for abatement of myoclonic activity.  With renal function though, Keppra level may be steadily increasing causing some lethargy.  Patient with multiple metabolic issues and infection that may be contributing as well.    Recommendations: 1.  Decrease Keppra to 750mg  daily 2.  Hepatic function and Depakote level today    LOS: 10 days   Thana Farr, MD Neurology [phone] 07/26/2015  3:40 PM

## 2015-07-26 NOTE — Progress Notes (Addendum)
Patient ID: Kimberly Montgomery, female   DOB: 1965/08/18, 50 y.o.   MRN: [id number]   PATIENT NAME: Kimberly Montgomery    MR#:  [id number]  DATE OF BIRTH:  14-Apr-1965  SUBJECTIVE:   Patient Is tolerating her diet. This morning she reports that she feels confused at times.  REVIEW OF SYSTEMS:   Review of Systems  Constitutional: Negative for fever, chills and weight loss.  HENT: Negative for ear discharge, ear pain and nosebleeds.   Eyes: Negative for blurred vision, pain and discharge.  Respiratory: Negative for sputum production, shortness of breath, wheezing and stridor.   Cardiovascular: Negative for chest pain, palpitations, orthopnea and PND.  Gastrointestinal: Negative for nausea, vomiting, abdominal pain, diarrhea, blood in stool and melena.  Genitourinary: Negative for urgency and frequency.  Musculoskeletal: Negative for back pain and joint pain.  Neurological: Positive for weakness. Negative for sensory change, speech change, focal weakness and seizures.  Psychiatric/Behavioral: Positive for memory loss. Negative for depression and hallucinations. The patient is not nervous/anxious.    Tolerating Diet:yes  Tolerating PT: rec rehab  DRUG ALLERGIES:   Allergies  Allergen Reactions  . Cephalosporins Anaphylaxis  . Penicillins Anaphylaxis and Other (See Comments)    Has patient had a PCN reaction causing immediate rash, facial/tongue/throat swelling, SOB or lightheadedness with hypotension: Yes Has patient had a PCN reaction causing severe rash involving mucus membranes or skin necrosis: No Has patient had a PCN reaction that required hospitalization No Has patient had a PCN reaction occurring within the last 10 years: No If all of the above answers are "NO", then may proceed with Cephalosporin use.  [name] Phenergan [Promethazine Hcl] Nausea And Vomiting    VITALS:  Blood pressure 123/53, pulse 69, temperature 98.1 F (36.7 C),  temperature source Oral, resp. rate 18, height 5' 9.02" (1.753 m), weight 130.847 kg (288 lb 7.4 oz), last menstrual period 03/16/2015, SpO2 96 %.  PHYSICAL EXAMINATION:   Physical Exam  Constitutional: She is oriented to person, place, and time and well-developed, well-nourished, and in no distress. No distress.  HENT:  Head: Normocephalic.  Eyes: No scleral icterus.  Neck: Normal range of motion. Neck supple. No JVD present. No tracheal deviation present.  Cardiovascular: Normal rate, regular rhythm and normal heart sounds.  Exam reveals no gallop and no friction rub.   No murmur heard. Pulmonary/Chest: Effort normal and breath sounds normal. No respiratory distress. She has no wheezes. She has no rales. She exhibits no tenderness.  Abdominal: Soft. Bowel sounds are normal. She exhibits no distension and no mass. There is no tenderness. There is no rebound and no guarding.  Musculoskeletal: Normal range of motion. She exhibits no edema.  Right prosetic leg Left toe with erythema  Neurological: She is alert and oriented to person, place, and time.  Slow but oriented 3  Skin: Skin is warm. No rash noted.  Cellulitis left hip  Psychiatric: Mood and judgment normal.      LABORATORY PANEL:  CBC  Recent Labs Lab 07/26/15 0357  WBC 10.6  HGB 7.3*  HCT 21.7*  PLT 280    Chemistries   Recent Labs Lab 07/21/15 0427  07/25/15 0454 07/26/15 0357  NA 128*  < > 129* 127*  K 4.2  < > 4.4 4.4  CL 91*  < > 89* 87*  CO2 22  < > 26 23  GLUCOSE 157*  < > 91 118*  BUN 62*  < >  61* 60*  CREATININE 9.89*  < > 10.58* 10.85*  CALCIUM 5.9*  < > 6.1* 6.2*  MG 1.9  --   --   --   AST  --   --  17  --   ALT  --   --  13*  --   ALKPHOS  --   --  83  --   BILITOT  --   --  0.6  --   < > = values in this interval not displayed. Cardiac Enzymes  Recent Labs Lab 07/21/15 2150  TROPONINI <0.03   RADIOLOGY:  Ct Femur Left W Contrast  07/25/2015  CLINICAL DATA:  Cellulitis of the  left thigh.  Left thigh pain. EXAM: CT OF THE LOWER LEFT EXTREMITY WITHOUT CONTRAST TECHNIQUE: Multidetector CT imaging of the left femur was performed according to the standard protocol. COMPARISON:  None. FINDINGS: There is no acute fracture or dislocation. There is no lytic or sclerotic osseous lesion. There is no periosteal reaction or bone destruction. There is lateral patellar tilting. The hip joint space is maintained. The muscles are normal. There is no muscle atrophy. There is no intramuscular fluid collection or hematoma. There is peripheral vascular atherosclerotic disease. There is soft tissue edema diffusely throughout the left thigh subcutaneous fat most severe along the lateral aspect consistent with cellulitis. There is no soft tissue emphysema. There is no focal fluid collection. IMPRESSION: 1. Left thigh cellulitis. No focal fluid collection to suggest abscess. Electronically Signed   By: Elige Ko   On: 07/25/2015 16:40   ASSESSMENT AND PLAN:  50 year old female patient with history of end-stage renal disease on peritoneal dialysis, hypertension, coronary artery disease, congestive heart failure presented to the emergency room with nausea and vomiting and abdominal discomfort.  1. Generalized tonic-clonic seizures new onset started on 07/21/2015 Continue Depakote 1000 mg twice a day and keppra 750 mg BID  EEG per Dr. Thad Ranger shows metabolic changes Repeat CT head 4/ 21 negative   2 Acute peritonitis with nausea vomiting and diffuse abdominal pain. Peritoneal fluid with neutrophils greater than 70% She was treated with Levofloxacin 500 mg QOD for 10 days and completed course. The repeat PD fluid clearing.  3 End-stage renal disease on peritoneal dialysis Continue peritoneal dialysis as recommended by nephrology.  Patient declining to convert to hemodialysis Spoke with Dr. Thedore Mins today about patient reporting confusion as this may be due to need for increased fluid removal on  PD.   4 Gastroparesis: Patient with improved symptoms. Continue erythromycin.   5 Hypertension: Continue Coreg and IMDUR and discontinued LISINOPRIL and Norvasc due to low blood pressure  6. Congestive heart failure, chronic, diastolic Continue home medication aspirin 81 mg, Plavix 75 mg, Coreg    7 Myoclonic jerks suspected due to gabapentin which was discontinued by Dr. Thad Ranger from neurology Wellbutrin was also discontinued as per Dr. Thad Ranger.  Psych consult recommended Abilify in its place.  8. Left hip Cellulitis: Improving Continue Bactrim. CT scan confirms cellulitis and no abscess.  9. Anemia of chronic kidney disease: Hemoglobin is stable.  10. Hyponatremia: Check TSH.  11. Constipation: This is driving gastroparesis and nausea. Aggressive bowel regimen. If no BM then will need disimpactiment. D/w nurse  12. Left toe: WOUND care consult  13. Confusion: This is from antiemetics, possible seizure medications, ESRD, constipation and prolonged stay in hospital. Discussed with Dr Thedore Mins and Dr Thad Ranger with plans to check depakote levels. Check TSH. Continue PD Cntinue aggressive bowel regimen as outlined  below.   PT consult placed and OOB orders placed  Case discussed with case manager Management plans discussed with the patient and daughter and she is in agreement. Family would like to go home with home health care at discharge and would not like transfer to [facility].  CODE STATUS:full   DVT Prophylaxis: heparin TOTAL TIME TAKING CARE OF THIS PATIENT: 25 minutes.    PLEASE call daughter tomorrow, she will not be here in the am  Discharge in 1-2 days if has bowel movement.   Note: This dictation was prepared with Dragon dictation along with smaller phrase technology. Any transcriptional errors that result from this process are unintentional.  Kimberly Montgomery M.D on 07/26/2015 at 11:53 AM  Between 7am to 6pm - Pager - [phone]  After 6pm go to [email or web address]  - password [email or web address] [facility]  [place] [facility]  Office  [phone]  CC: Primary care physician; Jarome Matin, MD

## 2015-07-26 NOTE — Care Management Important Message (Signed)
Important Message  Patient Details  Name: Kimberly Montgomery MRN: [id number] Date of Birth: 11-26-1965   Medicare Important Message Given:  Yes    [name] 07/26/2015, 9:47 AM

## 2015-07-27 ENCOUNTER — Inpatient Hospital Stay: Payer: Medicare HMO

## 2015-07-27 DIAGNOSIS — K567 Ileus, unspecified: Secondary | ICD-10-CM | POA: Insufficient documentation

## 2015-07-27 DIAGNOSIS — N186 End stage renal disease: Secondary | ICD-10-CM

## 2015-07-27 DIAGNOSIS — K659 Peritonitis, unspecified: Secondary | ICD-10-CM

## 2015-07-27 LAB — CBC
HCT: 20.7 % — ABNORMAL LOW (ref 35.0–47.0)
HEMOGLOBIN: 7 g/dL — AB (ref 12.0–16.0)
MCH: 28.1 pg (ref 26.0–34.0)
MCHC: 33.6 g/dL (ref 32.0–36.0)
MCV: 83.6 fL (ref 80.0–100.0)
PLATELETS: 252 10*3/uL (ref 150–440)
RBC: 2.47 MIL/uL — ABNORMAL LOW (ref 3.80–5.20)
RDW: 13.5 % (ref 11.5–14.5)
WBC: 8.2 10*3/uL (ref 3.6–11.0)

## 2015-07-27 LAB — GLUCOSE, CAPILLARY
GLUCOSE-CAPILLARY: 119 mg/dL — AB (ref 65–99)
GLUCOSE-CAPILLARY: 133 mg/dL — AB (ref 65–99)
Glucose-Capillary: 139 mg/dL — ABNORMAL HIGH (ref 65–99)
Glucose-Capillary: 98 mg/dL (ref 65–99)
Glucose-Capillary: 77 mg/dL (ref 65–99)
Glucose-Capillary: 78 mg/dL (ref 65–99)
Glucose-Capillary: 67 mg/dL (ref 65–99)

## 2015-07-27 LAB — PATHOLOGIST SMEAR REVIEW

## 2015-07-27 LAB — BASIC METABOLIC PANEL
Anion gap: 16 — ABNORMAL HIGH (ref 5–15)
BUN: 65 mg/dL — AB (ref 6–20)
CALCIUM: 5.8 mg/dL — AB (ref 8.9–10.3)
CHLORIDE: 89 mmol/L — AB (ref 101–111)
CO2: 26 mmol/L (ref 22–32)
CREATININE: 11.58 mg/dL — AB (ref 0.44–1.00)
GFR, EST AFRICAN AMERICAN: 4 mL/min — AB (ref >60)
GFR, EST NON AFRICAN AMERICAN: 3 mL/min — AB (ref >60)
Glucose, Bld: 167 mg/dL — ABNORMAL HIGH (ref 65–99)
Potassium: 4.2 mmol/L (ref 3.5–5.1)
SODIUM: 131 mmol/L — AB (ref 135–145)

## 2015-07-27 MED ORDER — CLINDAMYCIN PHOSPHATE 600 MG/50ML IV SOLN
600.0000 mg | Freq: Three times a day (TID) | INTRAVENOUS | Status: DC
  Administered 2015-07-27 – 2015-07-31 (×12): 600 mg via INTRAVENOUS
  Filled 2015-07-27 (×14): qty 50

## 2015-07-27 MED ORDER — VALPROATE SODIUM 500 MG/5ML IV SOLN
1000.0000 mg | Freq: Two times a day (BID) | INTRAVENOUS | Status: DC
  Administered 2015-07-27 – 2015-08-01 (×11): 1000 mg via INTRAVENOUS
  Filled 2015-07-27 (×15): qty 10

## 2015-07-27 MED ORDER — ONDANSETRON HCL 4 MG/2ML IJ SOLN
4.0000 mg | Freq: Four times a day (QID) | INTRAMUSCULAR | Status: DC | PRN
  Administered 2015-07-28 – 2015-08-02 (×11): 4 mg via INTRAVENOUS
  Filled 2015-07-27 (×12): qty 2

## 2015-07-27 MED ORDER — METOPROLOL TARTRATE 5 MG/5ML IV SOLN
5.0000 mg | INTRAVENOUS | Status: DC | PRN
Start: 2015-07-27 — End: 2015-08-02

## 2015-07-27 MED ORDER — LEVETIRACETAM 500 MG PO TABS: 1000.0000 mg | ORAL_TABLET | Freq: Every day | ORAL | Status: DC

## 2015-07-27 MED ORDER — SODIUM CHLORIDE 0.9 % IV SOLN
250.0000 mg | Freq: Three times a day (TID) | INTRAVENOUS | Status: DC
  Administered 2015-07-27 – 2015-08-02 (×18): 250 mg via INTRAVENOUS
  Filled 2015-07-27 (×25): qty 5

## 2015-07-27 MED ORDER — ONDANSETRON HCL 4 MG PO TABS
4.0000 mg | ORAL_TABLET | Freq: Four times a day (QID) | ORAL | Status: DC | PRN
  Administered 2015-08-02: 4 mg via ORAL
  Filled 2015-07-27: qty 1

## 2015-07-27 MED ORDER — SODIUM CHLORIDE 0.9 % IV SOLN
750.0000 mg | Freq: Once | INTRAVENOUS | Status: AC
  Administered 2015-07-27: 750 mg via INTRAVENOUS
  Filled 2015-07-27: qty 7.5

## 2015-07-27 MED ORDER — DEXTROSE 50 % IV SOLN
1.0000 | Freq: Once | INTRAVENOUS | Status: AC
  Administered 2015-07-27: 50 mL via INTRAVENOUS
  Filled 2015-07-27: qty 50

## 2015-07-27 MED ORDER — METOCLOPRAMIDE HCL 5 MG/ML IJ SOLN: 5.0000 mg | Freq: Four times a day (QID) | INTRAMUSCULAR | Status: DC

## 2015-07-27 NOTE — Care Management (Addendum)
Ileus verses small bowel obstruction. Surgical consult pending. Constipation and nausea. NG placed, dulcolax and enema. It is anticipated that patient will be discharged but discharge disposition unknown. Family wants to proceed to [facility] now. Per Dr. Amado Coe there are no beds today.

## 2015-07-27 NOTE — Progress Notes (Signed)
Peritoneal disconnect 

## 2015-07-27 NOTE — Progress Notes (Signed)
Patient discussed with Dr. Wyn Quaker, [facility], who is knowledgeable of the patient's care and will refer to Dr. Gilda Crease tomorrow

## 2015-07-27 NOTE — Progress Notes (Signed)
Subjective:     Poor PO intake. Continues to report vomiting  + peripheral edema, especially in thigh region PD went OK UF of 2600 cc last night NG tube placed this afternoon- >500 cc dark fluid obtained  Objective:  Vital signs in last 24 hours:  Temp:  [97.7 F (36.5 C)-98 F (36.7 C)] 98 F (36.7 C) (04/27 1202) Pulse Rate:  [60-72] 63 (04/27 1202) Resp:  [16-20] 16 (04/27 1202) BP: (103-132)/(47-56) 121/56 mmHg (04/27 1202) SpO2:  [92 %-99 %] 92 % (04/27 1202) Weight:  [128.096 kg (282 lb 6.4 oz)-129.003 kg (284 lb 6.4 oz)] 128.096 kg (282 lb 6.4 oz) (04/27 0418)  Weight change:  Filed Weights   07/25/15 0423 07/26/15 1702 07/27/15 0418  Weight: 130.847 kg (288 lb 7.4 oz) 129.003 kg (284 lb 6.4 oz) 128.096 kg (282 lb 6.4 oz)    Intake/Output:    Intake/Output Summary (Last 24 hours) at 07/27/15 1356 Last data filed at 07/27/15 1100  Gross per 24 hour  Intake      0 ml  Output   3423 ml  Net  -3423 ml     Physical Exam: General: NAD  HEENT Anicteric, moist oral mucous membranes, NG ube  Neck supple  Pulm/lungs Normal effort, clear to auscultation bilaterally  CVS/Heart No rub or gallop  Abdomen:  Soft, nontender  Extremities: + dependent peripheral edema, right BKA  Neurologic:  Alert, oriented  Skin: No acute rashes  Access: PD catheter, exit site nontender and clean       Basic Metabolic Panel:   Recent Labs Lab 07/21/15 0427 07/21/15 2150 07/23/15 0435 07/25/15 0454 07/26/15 0357 07/27/15 0403  NA 128* 126* 130* 129* 127* 131*  K 4.2 4.8 3.9 4.4 4.4 4.2  CL 91* 89* 91* 89* 87* 89*  CO2 22 20* 23 26 23 26   GLUCOSE 157* 106* 118* 91 118* 167*  BUN 62* 66* 61* 61* 60* 65*  CREATININE 9.89* 10.62* 10.25* 10.58* 10.85* 11.58*  CALCIUM 5.9* 6.0* 6.1* 6.1* 6.2* 5.8*  MG 1.9  --   --   --   --   --   PHOS  --   --   --   --  8.6*  --      CBC:  Recent Labs Lab 07/21/15 2150 07/23/15 0435 07/25/15 0454 07/26/15 0357 07/27/15 0403   WBC 14.5* 13.7* 10.8 10.6 8.2  NEUTROABS 11.9*  --   --   --   --   HGB 8.1* 7.4* 7.8* 7.3* 7.0*  HCT 24.6* 22.1* 23.2* 21.7* 20.7*  MCV 83.0 82.5 83.2 83.2 83.6  PLT 302 263 296 280 252      Microbiology:  Recent Results (from the past 720 hour(s))  Body fluid culture     Status: None   Collection Time: 07/15/15 10:23 PM  Result Value Ref Range Status   Specimen Description PERITONEAL  Final   Special Requests Normal  Final   Gram Stain RARE WBC SEEN NO ORGANISMS SEEN   Final   Culture No growth aerobically or anaerobically.  Final   Report Status 07/19/2015 FINAL  Final  Culture, blood (routine x 2)     Status: None   Collection Time: 07/16/15  3:58 AM  Result Value Ref Range Status   Specimen Description BLOOD RIGHT WRIST  Final   Special Requests   Final    BOTTLES DRAWN AEROBIC AND ANAEROBIC 12CCAERO,6CCANA   Culture NO GROWTH 5 DAYS  Final   Report  Status 07/21/2015 FINAL  Final  Culture, blood (routine x 2)     Status: None   Collection Time: 07/16/15  3:58 AM  Result Value Ref Range Status   Specimen Description BLOOD RIGHT FOREARM  Final   Special Requests   Final    BOTTLES DRAWN AEROBIC AND ANAEROBIC 12CCAERO,2CCANA   Culture NO GROWTH 5 DAYS  Final   Report Status 07/21/2015 FINAL  Final  MRSA PCR Screening     Status: None   Collection Time: 07/21/15 10:45 PM  Result Value Ref Range Status   MRSA by PCR NEGATIVE NEGATIVE Final    Comment:        The GeneXpert MRSA Assay (FDA approved for NASAL specimens only), is one component of a comprehensive MRSA colonization surveillance program. It is not intended to diagnose MRSA infection nor to guide or monitor treatment for MRSA infections.     Coagulation Studies: No results for input(s): LABPROT, INR in the last 72 hours.  Urinalysis: No results for input(s): COLORURINE, LABSPEC, PHURINE, GLUCOSEU, HGBUR, BILIRUBINUR, KETONESUR, PROTEINUR, UROBILINOGEN, NITRITE, LEUKOCYTESUR in the last 72  hours.  Invalid input(s): APPERANCEUR    Imaging: Ct Femur Left W Contrast  07/25/2015  CLINICAL DATA:  Cellulitis of the left thigh.  Left thigh pain. EXAM: CT OF THE LOWER LEFT EXTREMITY WITHOUT CONTRAST TECHNIQUE: Multidetector CT imaging of the left femur was performed according to the standard protocol. COMPARISON:  None. FINDINGS: There is no acute fracture or dislocation. There is no lytic or sclerotic osseous lesion. There is no periosteal reaction or bone destruction. There is lateral patellar tilting. The hip joint space is maintained. The muscles are normal. There is no muscle atrophy. There is no intramuscular fluid collection or hematoma. There is peripheral vascular atherosclerotic disease. There is soft tissue edema diffusely throughout the left thigh subcutaneous fat most severe along the lateral aspect consistent with cellulitis. There is no soft tissue emphysema. There is no focal fluid collection. IMPRESSION: 1. Left thigh cellulitis. No focal fluid collection to suggest abscess. Electronically Signed   By: Elige Ko   On: 07/25/2015 16:40     Medications:     . antiseptic oral rinse  7 mL Mouth Rinse BID  . ARIPiprazole  2 mg Oral Daily  . aspirin EC  81 mg Oral Daily  . calcium acetate  2,001 mg Oral TID WC  . calcium carbonate  1 tablet Oral TID WC  . carvedilol  6.25 mg Oral BID WC  . clindamycin (CLEOCIN) IV  600 mg Intravenous Q8H  . clopidogrel  75 mg Oral Daily  . dialysis solution 2.5% low-MG/low-CA   Intraperitoneal Q24H  . dialysis solution 2.5% low-MG/low-CA   Intraperitoneal Q24H  . divalproex  1,000 mg Oral BID  . docusate sodium  100 mg Oral BID  . erythromycin  250 mg Intravenous Q8H  . ezetimibe  10 mg Oral QHS  . gentamicin cream  1 application Topical Daily  . heparin  5,000 Units Subcutaneous 3 times per day  . insulin aspart  0-5 Units Subcutaneous QHS  . insulin aspart  0-9 Units Subcutaneous TID WC  . isosorbide mononitrate  60 mg Oral  Daily  . [START ON 07/28/2015] levETIRAcetam  1,000 mg Oral Daily  . pantoprazole  40 mg Oral BID  . polyethylene glycol  17 g Oral Daily  . ranolazine  500 mg Oral BID  . sodium chloride flush  3 mL Intravenous Q12H  . tuberculin  5 Units  Intradermal Once   sodium chloride, acetaminophen **OR** acetaminophen, albuterol, docusate sodium, heparin, lactulose, LORazepam, metoprolol, ondansetron **OR** ondansetron (ZOFRAN) IV, oxyCODONE-acetaminophen, polyethylene glycol, prochlorperazine, sodium chloride flush  Assessment/ Plan:  50 y.o. white  female with past medical history of end-stage renal disease on peritoneal dialysis, asthma, gastroparesis, diabetes mellitus, congestive heart failure, hypertension, endometriosis, coronary atherosclerosis, medical management  [facility]/[address]/peritoneal dialysis  1. End-stage renal disease on peritoneal dialysis.  4 exchanges 10 hours with 2.2 litre fills. 2.5% dextrose,   - nightly PD - UF 2600 last night   2. Acute peritonitis in PD patient. WBC cell count 690 with 73% neutrophils on admission.  - PD fluid culture is no growth. WBC count 21 on 4/25 - Empiric treatment with broad-spectrum antibiotics vancomycin (started 4/16- most recent dose 4/22) and Levaquin.   - VANC level 20 on 4/25  3. Anemia chronic kidney disease. - epo as outpatient. Holding now with neurologic changes, seizures.   4. Secondary hyperparathyroidism. phos elevated at 11.7 on 4/20.- > 8.6  - calcium acetate with meals once able to tolerate diet  5. Nausea and vomiting - NG tube placed - Changed erythromycin to iv - Patient had side effects to reglan in the past  6. Dependent generalized edema - UF with PD - use 2.5% bags     LOS: 11 Mcgwire Dasaro 4/27/20171:56 PM

## 2015-07-27 NOTE — Consult Note (Signed)
  Psychiatry: Came to round on this patient. Found her asleep and now she has a nasogastric tube in. I didn't see any benefit to waking her up for my evaluation today. I will continue to follow-up as she remains in the hospital.

## 2015-07-27 NOTE — Progress Notes (Signed)
Pre-peritoneal dialysis 

## 2015-07-27 NOTE — Consult Note (Signed)
Surgical Consultation  07/27/2015  Kimberly Montgomery is an 50 y.o. female.   CC: Nausea and vomiting  HPI: This patient with a history of end-stage renal disease who has a peritoneal dialysis catheter in place that has been placed and revised by [facility]. She presents with 8 days of nausea vomiting and abdominal pain and is being treated for peritonitis secondary to a gram-positive coccus she also has this indwelling peritoneal catheter. She has been passing gas and having bowel movements but she is required the aid of suppositories for bowel movements denies diarrhea.  Past Medical History  Diagnosis Date  . Renal disorder   . Hypertension   . CHF (congestive heart failure) ([place])   . Diabetes mellitus without complication ([place])   . Gastroparesis   . Asthma   . ESRD (end stage renal disease) ([place])   . Endometriosis   . CAD (coronary artery disease)   . HLD (hyperlipidemia)   . GERD (gastroesophageal reflux disease)     Past Surgical History  Procedure Laterality Date  . Hysterotomy    . Below knee leg amputation    . Pd tube inserstion    . Hd fistula surgery with reversal    . Cardiac catheterization Right 07/10/2015    Procedure: Left Heart Cath and Coronary Angiography;  Surgeon: Dionisio David, MD;  Location: [facility];  Service: Cardiovascular;  Laterality: Right;    Family History  Problem Relation Age of Onset  . CAD    . Diabetes    . Bipolar disorder    . Cervical cancer Mother     Social History:  reports that she has been smoking Cigarettes.  She has been smoking about 0.50 packs per day. She does not have any smokeless tobacco history on file. She reports that she does not drink alcohol or use illicit drugs.  Allergies:  Allergies  Allergen Reactions  . Cephalosporins Anaphylaxis  . Penicillins Anaphylaxis and Other (See Comments)    Has patient had a PCN reaction causing immediate rash, facial/tongue/throat swelling, SOB or  lightheadedness with hypotension: Yes Has patient had a PCN reaction causing severe rash involving mucus membranes or skin necrosis: No Has patient had a PCN reaction that required hospitalization No Has patient had a PCN reaction occurring within the last 10 years: No If all of the above answers are "NO", then may proceed with Cephalosporin use.  [name] Phenergan [Promethazine Hcl] Nausea And Vomiting    Medications reviewed.   Review of Systems:   Review of Systems  Constitutional: Negative.   HENT: Negative.   Eyes: Negative.   Respiratory: Negative.   Cardiovascular: Negative.   Gastrointestinal: Positive for nausea, vomiting, abdominal pain and constipation. Negative for heartburn, diarrhea, blood in stool and melena.  Genitourinary: Negative.   Musculoskeletal: Negative.   Skin: Negative.   Neurological: Negative.   Endo/Heme/Allergies: Negative.   Psychiatric/Behavioral: Negative.      Physical Exam:  BP 121/56 mmHg  Pulse 63  Temp(Src) 98 F (36.7 C) (Oral)  Resp 16  Ht 5' 9.02" (1.753 m)  Wt 282 lb 6.4 oz (128.096 kg)  BMI 41.68 kg/m2  SpO2 92%  LMP 03/16/2015  Physical Exam  Constitutional: She is oriented to person, place, and time and well-developed, well-nourished, and in no distress. No distress.  Obese female patient 282 pounds  HENT:  Head: Normocephalic and atraumatic.  Eyes: Pupils are equal, round, and reactive to light. Right eye exhibits no discharge. Left eye  exhibits no discharge. No scleral icterus.  Neck: Normal range of motion.  Cardiovascular: Normal rate.   Pulmonary/Chest: Effort normal. No respiratory distress.  Abdominal: Soft. She exhibits no distension. There is tenderness. There is no rebound and no guarding.  Minimal diffuse tenderness no peritoneal signs right sided peritoneal dialysis catheter  Musculoskeletal: Normal range of motion. She exhibits edema.  Lymphadenopathy:    She has no cervical adenopathy.  Neurological: She is  alert and oriented to person, place, and time.  Skin: Skin is warm and dry. She is not diaphoretic.  Psychiatric: Mood and affect normal.  Vitals reviewed.     Results for orders placed or performed during the hospital encounter of 07/15/15 (from the past 48 hour(s))  Glucose, capillary     Status: Abnormal   Collection Time: 07/25/15  4:17 PM  Result Value Ref Range   Glucose-Capillary 210 (H) 65 - 99 mg/dL  Glucose, capillary     Status: None   Collection Time: 07/25/15  9:02 PM  Result Value Ref Range   Glucose-Capillary 70 65 - 99 mg/dL   Comment 1 Notify RN   Glucose, capillary     Status: Abnormal   Collection Time: 07/25/15 10:26 PM  Result Value Ref Range   Glucose-Capillary 129 (H) 65 - 99 mg/dL   Comment 1 Notify RN   Basic metabolic panel     Status: Abnormal   Collection Time: 07/26/15  3:57 AM  Result Value Ref Range   Sodium 127 (L) 135 - 145 mmol/L   Potassium 4.4 3.5 - 5.1 mmol/L   Chloride 87 (L) 101 - 111 mmol/L   CO2 23 22 - 32 mmol/L   Glucose, Bld 118 (H) 65 - 99 mg/dL   BUN 60 (H) 6 - 20 mg/dL   Creatinine, Ser 10.85 (H) 0.44 - 1.00 mg/dL   Calcium 6.2 (LL) 8.9 - 10.3 mg/dL    Comment: CRITICAL RESULT CALLED TO, READ BACK BY AND VERIFIED WITH KATHY STEWART @ 0511 ON 07/26/2015 BY CAF    GFR calc non Af Amer 4 (L) >60 mL/min   GFR calc Af Amer 4 (L) >60 mL/min    Comment: (NOTE) The eGFR has been calculated using the CKD EPI equation. This calculation has not been validated in all clinical situations. eGFR's persistently <60 mL/min signify possible Chronic Kidney Disease.    Anion gap 17 (H) 5 - 15  CBC     Status: Abnormal   Collection Time: 07/26/15  3:57 AM  Result Value Ref Range   WBC 10.6 3.6 - 11.0 K/uL   RBC 2.61 (L) 3.80 - 5.20 MIL/uL   Hemoglobin 7.3 (L) 12.0 - 16.0 g/dL   HCT 21.7 (L) 35.0 - 47.0 %   MCV 83.2 80.0 - 100.0 fL   MCH 27.8 26.0 - 34.0 pg   MCHC 33.4 32.0 - 36.0 g/dL   RDW 13.5 11.5 - 14.5 %   Platelets 280 150 - 440  K/uL  Phosphorus     Status: Abnormal   Collection Time: 07/26/15  3:57 AM  Result Value Ref Range   Phosphorus 8.6 (H) 2.5 - 4.6 mg/dL  TSH     Status: None   Collection Time: 07/26/15  3:57 AM  Result Value Ref Range   TSH 4.144 0.350 - 4.500 uIU/mL  Glucose, capillary     Status: Abnormal   Collection Time: 07/26/15  7:27 AM  Result Value Ref Range   Glucose-Capillary 148 (H) 65 - 99  mg/dL  Glucose, capillary     Status: Abnormal   Collection Time: 07/26/15 11:18 AM  Result Value Ref Range   Glucose-Capillary 107 (H) 65 - 99 mg/dL  Valproic acid level     Status: Abnormal   Collection Time: 07/26/15  2:49 PM  Result Value Ref Range   Valproic Acid Lvl 28 (L) 50.0 - 100.0 ug/mL  Hepatic function panel     Status: Abnormal   Collection Time: 07/26/15  2:49 PM  Result Value Ref Range   Total Protein 4.9 (L) 6.5 - 8.1 g/dL   Albumin 1.7 (L) 3.5 - 5.0 g/dL   AST 16 15 - 41 U/L   ALT 11 (L) 14 - 54 U/L   Alkaline Phosphatase 84 38 - 126 U/L   Total Bilirubin 0.5 0.3 - 1.2 mg/dL   Bilirubin, Direct <0.1 (L) 0.1 - 0.5 mg/dL   Indirect Bilirubin NOT CALCULATED 0.3 - 0.9 mg/dL  Glucose, capillary     Status: Abnormal   Collection Time: 07/26/15  4:38 PM  Result Value Ref Range   Glucose-Capillary 111 (H) 65 - 99 mg/dL  Glucose, capillary     Status: Abnormal   Collection Time: 07/26/15  9:10 PM  Result Value Ref Range   Glucose-Capillary 101 (H) 65 - 99 mg/dL   Comment 1 Notify RN   CBC     Status: Abnormal   Collection Time: 07/27/15  4:03 AM  Result Value Ref Range   WBC 8.2 3.6 - 11.0 K/uL   RBC 2.47 (L) 3.80 - 5.20 MIL/uL   Hemoglobin 7.0 (L) 12.0 - 16.0 g/dL   HCT 20.7 (L) 35.0 - 47.0 %   MCV 83.6 80.0 - 100.0 fL   MCH 28.1 26.0 - 34.0 pg   MCHC 33.6 32.0 - 36.0 g/dL   RDW 13.5 11.5 - 14.5 %   Platelets 252 150 - 440 K/uL  Basic metabolic panel     Status: Abnormal   Collection Time: 07/27/15  4:03 AM  Result Value Ref Range   Sodium 131 (L) 135 - 145 mmol/L    Potassium 4.2 3.5 - 5.1 mmol/L   Chloride 89 (L) 101 - 111 mmol/L   CO2 26 22 - 32 mmol/L   Glucose, Bld 167 (H) 65 - 99 mg/dL   BUN 65 (H) 6 - 20 mg/dL   Creatinine, Ser 11.58 (H) 0.44 - 1.00 mg/dL   Calcium 5.8 (LL) 8.9 - 10.3 mg/dL    Comment: CRITICAL RESULT CALLED TO, READ BACK BY AND VERIFIED WITH LEXI MILLER @ 0440 ON 07/27/2015 BY CAF    GFR calc non Af Amer 3 (L) >60 mL/min   GFR calc Af Amer 4 (L) >60 mL/min    Comment: (NOTE) The eGFR has been calculated using the CKD EPI equation. This calculation has not been validated in all clinical situations. eGFR's persistently <60 mL/min signify possible Chronic Kidney Disease.    Anion gap 16 (H) 5 - 15  Glucose, capillary     Status: Abnormal   Collection Time: 07/27/15  7:35 AM  Result Value Ref Range   Glucose-Capillary 139 (H) 65 - 99 mg/dL   Comment 1 Notify RN   Glucose, capillary     Status: Abnormal   Collection Time: 07/27/15 12:00 PM  Result Value Ref Range   Glucose-Capillary 119 (H) 65 - 99 mg/dL   Comment 1 Notify RN    Dg Abd 1 View  07/27/2015  CLINICAL DATA:  NG tube  placement, ileus, abd pain, pt admitted 8 days ago for n/v EXAM: ABDOMEN - 1 VIEW COMPARISON:  07/24/2015 FINDINGS: Nasogastric tube tip projects in the right upper quadrant consistent with a in the distal stomach. Stomach appears decompressed. Moderate colonic stool burden evident in the right abdomen. No evidence of bowel obstruction. The IMPRESSION: Nasogastric tube well positioned. Tip projects in the distal stomach Electronically Signed   By: Lajean Manes M.D.   On: 07/27/2015 14:00   Ct Femur Left W Contrast  07/25/2015  CLINICAL DATA:  Cellulitis of the left thigh.  Left thigh pain. EXAM: CT OF THE LOWER LEFT EXTREMITY WITHOUT CONTRAST TECHNIQUE: Multidetector CT imaging of the left femur was performed according to the standard protocol. COMPARISON:  None. FINDINGS: There is no acute fracture or dislocation. There is no lytic or sclerotic  osseous lesion. There is no periosteal reaction or bone destruction. There is lateral patellar tilting. The hip joint space is maintained. The muscles are normal. There is no muscle atrophy. There is no intramuscular fluid collection or hematoma. There is peripheral vascular atherosclerotic disease. There is soft tissue edema diffusely throughout the left thigh subcutaneous fat most severe along the lateral aspect consistent with cellulitis. There is no soft tissue emphysema. There is no focal fluid collection. IMPRESSION: 1. Left thigh cellulitis. No focal fluid collection to suggest abscess. Electronically Signed   By: Kathreen Devoid   On: 07/25/2015 16:40    Assessment/Plan:  Single organism bacterial peritonitis likely secondary to infected peritoneal dialysis catheter. There is no sign of bowel obstruction of a mechanical nature on personally reviewed films. At this point I would recommend contacting Dr. Delana Meyer and discuss options for treatment of this peritoneal dialysis catheter. I see no need for surgical intervention from a general surgery standpoint alone however  Florene Glen, MD, FACS

## 2015-07-27 NOTE — Progress Notes (Signed)
PT Cancellation Note  Patient Details Name: SHAWNYA WICKEL MRN: [id number] DOB: [birth year]   Cancelled Treatment:    Reason Eval/Treat Not Completed: Other (comment). Received Re-eval consult this date. Pt with increased nausea with movement and has severely low Ca+ at 5.8 and decreased Hgb. Pt is not appropriate for therapy at this time. Will continue to re-assess.    Yaniv Lage 07/27/2015, 11:54 AM  Elizabeth Palau, PT, DPT [phone]

## 2015-07-27 NOTE — Progress Notes (Addendum)
Patient ID: Kimberly Montgomery, female   DOB: September 28, 1965, 50 y.o.   MRN: [id number] [place] [facility]   PATIENT NAME: Kimberly Montgomery    MR#:  [id number]  DATE OF BIRTH:  Sep 07, 1965  SUBJECTIVE:   Patient Is very nauseous and vomiting. Reporting diffuse abdominal discomfort with some pain. No fever . Improved but still intermittent myoclonic jerks  REVIEW OF SYSTEMS:   Review of Systems  Constitutional: Negative for fever, chills and weight loss.  HENT: Negative for ear discharge, ear pain and nosebleeds.   Eyes: Negative for blurred vision, pain and discharge.  Respiratory: Negative for sputum production, shortness of breath, wheezing and stridor.   Cardiovascular: Negative for chest pain, palpitations, orthopnea and PND.  Gastrointestinal: Positive for nausea, vomiting and abdominal pain. Negative for diarrhea, blood in stool and melena.  Genitourinary: Negative for urgency and frequency.  Musculoskeletal: Negative for back pain and joint pain.  Neurological: Positive for weakness. Negative for sensory change, speech change, focal weakness and seizures.  Psychiatric/Behavioral: Positive for memory loss. Negative for depression and hallucinations. The patient is not nervous/anxious.    Tolerating Diet:yes  Tolerating PT: rec rehab  DRUG ALLERGIES:   Allergies  Allergen Reactions  . Cephalosporins Anaphylaxis  . Penicillins Anaphylaxis and Other (See Comments)    Has patient had a PCN reaction causing immediate rash, facial/tongue/throat swelling, SOB or lightheadedness with hypotension: Yes Has patient had a PCN reaction causing severe rash involving mucus membranes or skin necrosis: No Has patient had a PCN reaction that required hospitalization No Has patient had a PCN reaction occurring within the last 10 years: No If all of the above answers are "NO", then may proceed with Cephalosporin use.  [name] Phenergan [Promethazine Hcl] Nausea And Vomiting     VITALS:  Blood pressure 121/56, pulse 63, temperature 98 F (36.7 C), temperature source Oral, resp. rate 16, height 5' 9.02" (1.753 m), weight 128.096 kg (282 lb 6.4 oz), last menstrual period 03/16/2015, SpO2 92 %.  PHYSICAL EXAMINATION:   Physical Exam  Constitutional: She is oriented to person, place, and time and well-developed, well-nourished, and in no distress. No distress.  HENT:  Head: Normocephalic.  Eyes: No scleral icterus.  Neck: Normal range of motion. Neck supple. No JVD present. No tracheal deviation present.  Cardiovascular: Normal rate, regular rhythm and normal heart sounds.  Exam reveals no gallop and no friction rub.   No murmur heard. Pulmonary/Chest: Effort normal and breath sounds normal. No respiratory distress. She has no wheezes. She has no rales. She exhibits no tenderness.  Abdominal: Soft. She exhibits distension. She exhibits no mass. There is no tenderness. There is no rebound and no guarding.  Hypoactive bowel sounds in 2 quadrants  Musculoskeletal: Normal range of motion. She exhibits no edema.  Right prosetic leg Left toe with erythema  Neurological: She is alert and oriented to person, place, and time.  Slow but oriented 3  Skin: Skin is warm. No rash noted.  Cellulitis left hip  Psychiatric: Mood and judgment normal.      LABORATORY PANEL:  CBC  Recent Labs Lab 07/27/15 0403  WBC 8.2  HGB 7.0*  HCT 20.7*  PLT 252    Chemistries   Recent Labs Lab 07/21/15 0427  07/26/15 1449 07/27/15 0403  NA 128*  < >  --  131*  K 4.2  < >  --  4.2  CL 91*  < >  --  89*  CO2  22  < >  --  26  GLUCOSE 157*  < >  --  167*  BUN 62*  < >  --  65*  CREATININE 9.89*  < >  --  11.58*  CALCIUM 5.9*  < >  --  5.8*  MG 1.9  --   --   --   AST  --   < > 16  --   ALT  --   < > 11*  --   ALKPHOS  --   < > 84  --   BILITOT  --   < > 0.5  --   < > = values in this interval not displayed. Cardiac Enzymes  Recent Labs Lab 07/21/15 2150   TROPONINI <0.03   RADIOLOGY:  Ct Femur Left W Contrast  07/25/2015  CLINICAL DATA:  Cellulitis of the left thigh.  Left thigh pain. EXAM: CT OF THE LOWER LEFT EXTREMITY WITHOUT CONTRAST TECHNIQUE: Multidetector CT imaging of the left femur was performed according to the standard protocol. COMPARISON:  None. FINDINGS: There is no acute fracture or dislocation. There is no lytic or sclerotic osseous lesion. There is no periosteal reaction or bone destruction. There is lateral patellar tilting. The hip joint space is maintained. The muscles are normal. There is no muscle atrophy. There is no intramuscular fluid collection or hematoma. There is peripheral vascular atherosclerotic disease. There is soft tissue edema diffusely throughout the left thigh subcutaneous fat most severe along the lateral aspect consistent with cellulitis. There is no soft tissue emphysema. There is no focal fluid collection. IMPRESSION: 1. Left thigh cellulitis. No focal fluid collection to suggest abscess. Electronically Signed   By: Elige Ko   On: 07/25/2015 16:40   ASSESSMENT AND PLAN:  50 year old female patient with history of end-stage renal disease on peritoneal dialysis, hypertension, coronary artery disease, congestive heart failure presented to the emergency room with nausea and vomiting and abdominal discomfort.   # Ileus versus small bowel obstruction with nausea and vomiting Patient is made nothing by mouth. NG tube placed for decompression. So far 450 mL of brownish fluid Abdominal x-ray ordered Patient is afebrile. Surgical consult is placed. Check a.m. Labs. Patient is allergic to Reglan. We will continue Zofran dose is increased from 2 mg to 4 mg IV   #. Generalized tonic-clonic seizures new onset started on 07/21/2015 Continue Depakote 1000 mg twice a day and keppra dose increased to 1000 mg by neurology EEG per Dr. Thad Ranger shows metabolic changes Repeat CT head 4/ 21 negative   2 Acute  peritonitis with nausea vomiting and diffuse abdominal pain. Peritoneal fluid with neutrophils greater than 70% initially, repeated fluid  is clear She was treated with Levofloxacin 500 mg QOD for 10 days and vancomycin completed course.   3 End-stage renal disease on peritoneal dialysis Continue peritoneal dialysis as recommended by nephrology.  Patient declining to convert to hemodialysis Spoke with Dr. Thedore Mins today , agrees with the current plan   4 Gastroparesis: Patient with improved symptoms. Continue erythromycin, changed from by mouth to IV as patient is currently nothing by mouth   5 Hypertension: Hold Coreg and IMDUR as patient is currently nothing by mouth and discontinued LISINOPRIL and Norvasc due to low blood pressure  6. Congestive heart failure, chronic, diastolic Continue home medication aspirin 81 mg, Plavix 75 mg, Coreg when patient starts tolerating by mouth Monitor for symptoms and signs of fluid overload   7 Myoclonic jerks  due to gabapentin which  was discontinued by Dr. Thad Ranger from neurology Still intermittent, Keppra dose is increased to 1 g  Wellbutrin was also discontinued as per Dr. Thad Ranger.  Psych consult recommended Abilify in its place.  8. Left hip Cellulitis: Improving Continue Bactrim once patient is tolerating by mouth. But for now will start the patient on IV clindamycin, CT scan confirms cellulitis and no abscess.  9. Anemia of chronic kidney disease: Hemoglobin is stable.  10. Hyponatremia: Sodium at 131 Check TSH.  11. Constipation: This is driving gastroparesis and nausea. Aggressive bowel regimen. If no BM then will need disimpactiment. D/w nurse  12. Left toe: WOUND care consult  13. Confusion: Significantly improved  Check TSH. Continue PD Cntinue aggressive bowel regimen   PT consult placed and OOB orders placed  Case discussed with case manager Management plans discussed with the patient and daughter and BOTH  are in  agreement. Family would like to go  to [facility] when bed is available  CODE STATUS:full   DVT Prophylaxis: heparin TOTAL TIME TAKING CARE OF THIS PATIENT: 35 minutes.     Discharge in 1-2 days if has bowel movement.   Note: This dictation was prepared with Dragon dictation along with smaller phrase technology. Any transcriptional errors that result from this process are unintentional.  Ramonita Lab M.D on 07/27/2015 at 1:29 PM  Between 7am to 6pm - Pager - [phone]  After 6pm go to [email or web address] - password [email or web address]  [facility]  Office  [phone]  CC: Primary care physician; Jarome Matin, MD

## 2015-07-27 NOTE — Progress Notes (Signed)
NG tube placed. Per Dr. Amado Coe okay to increase zofran to 4mg . Will continue to monitor patient Kimberly Montgomery

## 2015-07-27 NOTE — Progress Notes (Signed)
Peritoneal dialysis start 

## 2015-07-27 NOTE — Plan of Care (Signed)
Problem: Nutrition: Goal: Adequate nutrition will be maintained Outcome: Not Progressing Patient is having some N/V. NG tube placed. Patient is NPO

## 2015-07-27 NOTE — Progress Notes (Addendum)
Subjective: Patient reports that she noticed some jerking early this morning that has resolved.  Has had some nausea and vomiting.    Objective: Current vital signs: BP 122/49 mmHg  Pulse 61  Temp(Src) 97.7 F (36.5 C) (Oral)  Resp 20  Ht 5' 9.02" (1.753 m)  Wt 128.096 kg (282 lb 6.4 oz)  BMI 41.68 kg/m2  SpO2 94%  LMP 03/16/2015 Vital signs in last 24 hours: Temp:  [97.7 F (36.5 C)-98.1 F (36.7 C)] 97.7 F (36.5 C) (04/27 0418) Pulse Rate:  [60-72] 61 (04/27 0823) Resp:  [18-20] 20 (04/27 0418) BP: (103-132)/(47-55) 122/49 mmHg (04/27 0823) SpO2:  [94 %-99 %] 94 % (04/27 0418) Weight:  [128.096 kg (282 lb 6.4 oz)-129.003 kg (284 lb 6.4 oz)] 128.096 kg (282 lb 6.4 oz) (04/27 0418)  Intake/Output from previous day: 04/26 0701 - 04/27 0700 In: 360 [P.O.:360] Out: 200 [Emesis/NG output:200] Intake/Output this shift: Total I/O In: -  Out: 2823 [Emesis/NG output:200; Other:2623] Nutritional status: Diet - low sodium heart healthy Diet - low sodium heart healthy Diet - low sodium heart healthy Diet NPO time specified  Neurologic Exam: No myoclonus noted  Lab Results: Basic Metabolic Panel:  Recent Labs Lab 07/21/15 0427 07/21/15 2150 07/23/15 0435 07/25/15 0454 07/26/15 0357 07/27/15 0403  NA 128* 126* 130* 129* 127* 131*  K 4.2 4.8 3.9 4.4 4.4 4.2  CL 91* 89* 91* 89* 87* 89*  CO2 22 20* 23 26 23 26   GLUCOSE 157* 106* 118* 91 118* 167*  BUN 62* 66* 61* 61* 60* 65*  CREATININE 9.89* 10.62* 10.25* 10.58* 10.85* 11.58*  CALCIUM 5.9* 6.0* 6.1* 6.1* 6.2* 5.8*  MG 1.9  --   --   --   --   --   PHOS  --   --   --   --  8.6*  --     Liver Function Tests:  Recent Labs Lab 07/25/15 0454 07/26/15 1449  AST 17 16  ALT 13* 11*  ALKPHOS 83 84  BILITOT 0.6 0.5  PROT 5.1* 4.9*  ALBUMIN 1.8* 1.7*   No results for input(s): LIPASE, AMYLASE in the last 168 hours. No results for input(s): AMMONIA in the last 168 hours.  CBC:  Recent Labs Lab 07/21/15 2150  07/23/15 0435 07/25/15 0454 07/26/15 0357 07/27/15 0403  WBC 14.5* 13.7* 10.8 10.6 8.2  NEUTROABS 11.9*  --   --   --   --   HGB 8.1* 7.4* 7.8* 7.3* 7.0*  HCT 24.6* 22.1* 23.2* 21.7* 20.7*  MCV 83.0 82.5 83.2 83.2 83.6  PLT 302 263 296 280 252    Cardiac Enzymes:  Recent Labs Lab 07/21/15 2150  TROPONINI <0.03    Lipid Panel: No results for input(s): CHOL, TRIG, HDL, CHOLHDL, VLDL, LDLCALC in the last 168 hours.  CBG:  Recent Labs Lab 07/26/15 0727 07/26/15 1118 07/26/15 1638 07/26/15 2110 07/27/15 0735  GLUCAP 148* 107* 111* 101* 139*    Microbiology: Results for orders placed or performed during the hospital encounter of 07/15/15  Body fluid culture     Status: None   Collection Time: 07/15/15 10:23 PM  Result Value Ref Range Status   Specimen Description PERITONEAL  Final   Special Requests Normal  Final   Gram Stain RARE WBC SEEN NO ORGANISMS SEEN   Final   Culture No growth aerobically or anaerobically.  Final   Report Status 07/19/2015 FINAL  Final  Culture, blood (routine x 2)     Status:  None   Collection Time: 07/16/15  3:58 AM  Result Value Ref Range Status   Specimen Description BLOOD RIGHT WRIST  Final   Special Requests   Final    BOTTLES DRAWN AEROBIC AND ANAEROBIC 12CCAERO,6CCANA   Culture NO GROWTH 5 DAYS  Final   Report Status 07/21/2015 FINAL  Final  Culture, blood (routine x 2)     Status: None   Collection Time: 07/16/15  3:58 AM  Result Value Ref Range Status   Specimen Description BLOOD RIGHT FOREARM  Final   Special Requests   Final    BOTTLES DRAWN AEROBIC AND ANAEROBIC 12CCAERO,2CCANA   Culture NO GROWTH 5 DAYS  Final   Report Status 07/21/2015 FINAL  Final  MRSA PCR Screening     Status: None   Collection Time: 07/21/15 10:45 PM  Result Value Ref Range Status   MRSA by PCR NEGATIVE NEGATIVE Final    Comment:        The GeneXpert MRSA Assay (FDA approved for NASAL specimens only), is one component of a comprehensive MRSA  colonization surveillance program. It is not intended to diagnose MRSA infection nor to guide or monitor treatment for MRSA infections.     Coagulation Studies: No results for input(s): LABPROT, INR in the last 72 hours.  Imaging: Ct Femur Left W Contrast  07/25/2015  CLINICAL DATA:  Cellulitis of the left thigh.  Left thigh pain. EXAM: CT OF THE LOWER LEFT EXTREMITY WITHOUT CONTRAST TECHNIQUE: Multidetector CT imaging of the left femur was performed according to the standard protocol. COMPARISON:  None. FINDINGS: There is no acute fracture or dislocation. There is no lytic or sclerotic osseous lesion. There is no periosteal reaction or bone destruction. There is lateral patellar tilting. The hip joint space is maintained. The muscles are normal. There is no muscle atrophy. There is no intramuscular fluid collection or hematoma. There is peripheral vascular atherosclerotic disease. There is soft tissue edema diffusely throughout the left thigh subcutaneous fat most severe along the lateral aspect consistent with cellulitis. There is no soft tissue emphysema. There is no focal fluid collection. IMPRESSION: 1. Left thigh cellulitis. No focal fluid collection to suggest abscess. Electronically Signed   By: Elige Ko   On: 07/25/2015 16:40    Medications:  I have reviewed the patient's current medications. Scheduled: . antiseptic oral rinse  7 mL Mouth Rinse BID  . ARIPiprazole  2 mg Oral Daily  . aspirin EC  81 mg Oral Daily  . calcium acetate  2,001 mg Oral TID WC  . calcium carbonate  1 tablet Oral TID WC  . carvedilol  6.25 mg Oral BID WC  . clopidogrel  75 mg Oral Daily  . dialysis solution 2.5% low-MG/low-CA   Intraperitoneal Q24H  . dialysis solution 2.5% low-MG/low-CA   Intraperitoneal Q24H  . divalproex  1,000 mg Oral BID  . docusate sodium  100 mg Oral BID  . erythromycin  250 mg Oral TID WC & HS  . ezetimibe  10 mg Oral QHS  . gentamicin cream  1 application Topical Daily   . heparin  5,000 Units Subcutaneous 3 times per day  . insulin aspart  0-5 Units Subcutaneous QHS  . insulin aspart  0-9 Units Subcutaneous TID WC  . isosorbide mononitrate  60 mg Oral Daily  . levETIRAcetam  750 mg Oral Daily  . metoCLOPramide (REGLAN) injection  5 mg Intravenous Q6H  . pantoprazole  40 mg Oral BID  . polyethylene glycol  17 g Oral Daily  . ranolazine  500 mg Oral BID  . sodium chloride flush  3 mL Intravenous Q12H  . sulfamethoxazole-trimethoprim  1 tablet Oral Q48H  . tuberculin  5 Units Intradermal Once    Assessment/Plan: Patient with some breakthrough myoclonic activity.  Keppra will require further adjustment.  Depakote level 28.  LFT's unremarkable.    Recommendations: 1.  Increase Keppra to 1000mg  daily.     LOS: 11 days   Thana Farr, MD Neurology [phone] 07/27/2015  11:14 AM

## 2015-07-28 LAB — COMPREHENSIVE METABOLIC PANEL
ALT: 11 U/L — ABNORMAL LOW (ref 14–54)
AST: 21 U/L (ref 15–41)
Albumin: 1.7 g/dL — ABNORMAL LOW (ref 3.5–5.0)
Alkaline Phosphatase: 76 U/L (ref 38–126)
Anion gap: 17 — ABNORMAL HIGH (ref 5–15)
BUN: 61 mg/dL — ABNORMAL HIGH (ref 6–20)
CO2: 27 mmol/L (ref 22–32)
Calcium: 5.4 mg/dL — CL (ref 8.9–10.3)
Chloride: 90 mmol/L — ABNORMAL LOW (ref 101–111)
Creatinine, Ser: 11.82 mg/dL — ABNORMAL HIGH (ref 0.44–1.00)
GFR calc Af Amer: 4 mL/min — ABNORMAL LOW (ref >60)
GFR calc non Af Amer: 3 mL/min — ABNORMAL LOW (ref >60)
Glucose, Bld: 99 mg/dL (ref 65–99)
Potassium: 3.7 mmol/L (ref 3.5–5.1)
Sodium: 134 mmol/L — ABNORMAL LOW (ref 135–145)
Total Bilirubin: 0.6 mg/dL (ref 0.3–1.2)
Total Protein: 4.8 g/dL — ABNORMAL LOW (ref 6.5–8.1)

## 2015-07-28 LAB — GLUCOSE, CAPILLARY
GLUCOSE-CAPILLARY: 104 mg/dL — AB (ref 65–99)
GLUCOSE-CAPILLARY: 102 mg/dL — AB (ref 65–99)
GLUCOSE-CAPILLARY: 86 mg/dL (ref 65–99)
GLUCOSE-CAPILLARY: 114 mg/dL — AB (ref 65–99)
Glucose-Capillary: 104 mg/dL — ABNORMAL HIGH (ref 65–99)
Glucose-Capillary: 105 mg/dL — ABNORMAL HIGH (ref 65–99)

## 2015-07-28 LAB — CBC
HEMATOCRIT: 21.9 % — AB (ref 35.0–47.0)
Hemoglobin: 7.3 g/dL — ABNORMAL LOW (ref 12.0–16.0)
MCH: 28.3 pg (ref 26.0–34.0)
MCHC: 33.3 g/dL (ref 32.0–36.0)
MCV: 85 fL (ref 80.0–100.0)
Platelets: 262 10*3/uL (ref 150–440)
RBC: 2.58 MIL/uL — ABNORMAL LOW (ref 3.80–5.20)
RDW: 13.8 % (ref 11.5–14.5)
WBC: 7.7 10*3/uL (ref 3.6–11.0)

## 2015-07-28 LAB — TSH: TSH: 6.109 u[IU]/mL — AB (ref 0.350–4.500)

## 2015-07-28 MED ORDER — ZINC OXIDE 20 % EX OINT
TOPICAL_OINTMENT | CUTANEOUS | Status: DC | PRN
  Administered 2015-07-29: 09:00:00 via TOPICAL
  Filled 2015-07-28 (×2): qty 28.35

## 2015-07-28 MED ORDER — SODIUM CHLORIDE 0.9 % IV SOLN
500.0000 mg | Freq: Two times a day (BID) | INTRAVENOUS | Status: DC
  Administered 2015-07-28 – 2015-07-31 (×6): 500 mg via INTRAVENOUS
  Filled 2015-07-28 (×7): qty 5

## 2015-07-28 MED ORDER — NYSTATIN 100000 UNIT/GM EX POWD
1.0000 | Freq: Two times a day (BID) | CUTANEOUS | Status: DC
  Administered 2015-07-28 – 2015-08-02 (×10): 1 via TOPICAL
  Filled 2015-07-28: qty 15

## 2015-07-28 MED ORDER — COLLAGENASE 250 UNIT/GM EX OINT
TOPICAL_OINTMENT | Freq: Every day | CUTANEOUS | Status: DC
  Administered 2015-07-28 – 2015-08-02 (×6): via TOPICAL
  Filled 2015-07-28: qty 30

## 2015-07-28 MED ORDER — LEVOTHYROXINE SODIUM 100 MCG IV SOLR
25.0000 ug | Freq: Every day | INTRAVENOUS | Status: DC
  Administered 2015-07-28: 25 ug via INTRAVENOUS
  Filled 2015-07-28: qty 5

## 2015-07-28 MED ORDER — SODIUM CHLORIDE 0.9 % IV SOLN
1000.0000 mg | INTRAVENOUS | Status: DC
  Filled 2015-07-28 (×2): qty 10

## 2015-07-28 MED ORDER — SODIUM CHLORIDE 0.9 % IV SOLN
1.0000 g | Freq: Two times a day (BID) | INTRAVENOUS | Status: AC
  Administered 2015-07-28 (×2): 1 g via INTRAVENOUS
  Filled 2015-07-28 (×2): qty 10

## 2015-07-28 MED ORDER — BISACODYL 10 MG RE SUPP
10.0000 mg | Freq: Every day | RECTAL | Status: DC
  Administered 2015-07-28 – 2015-07-31 (×4): 10 mg via RECTAL
  Filled 2015-07-28 (×4): qty 1

## 2015-07-28 NOTE — Consult Note (Signed)
ENDOCRINOLOGY CONSULTATION  REFERRING PHYSICIAN: Ramonita Lab, MD CONSULTING PHYSICIAN:  A. Wendall Mola, MD.  CHIEF COMPLAINT:  Elevated TSH level  History of Present Illness  Kimberly Montgomery is a 50 y.o. female with multiple medical problems including diabetes and ESRD on peritoneal dialysis who was admitted for abd pain and nausea attributes to acute catheter-associated peritonitis. She is seen in consultation for elevated TSH and history of goiter.  Kimberly Montgomery reports that over 20 years ago she was diagnosed with hyperthyroidism. She was treated with radioiodine at that time by Dr. Patrecia Pace. She is not aware of having had thyroid problems since that time. She does not take thyroid hormone. Today, her TSH level was mildly elevated at 6.109 uIU/ml (ref range 0.35-4.5). On 07/26/15, her TSH level was normal at 4.144 uIU/ml. Two months ago, her TSH level was mildly elevated at 6.833 uIU/ml. She denies cold intolerance. Denies dysphagia. Occasional choking on solids. No change in voice. No known family h/o thyroid disease. No known use of amiodarone or lithium. No recent known exposure to contrast dye. Levothyroxine 25 mcg IV was ordered to be given earlier today.  Medical history Past Medical History  Diagnosis Date  . Renal disorder   . Hypertension   . CHF (congestive heart failure) (HCC)   . Diabetes mellitus without complication (HCC)   . Gastroparesis   . Asthma   . ESRD (end stage renal disease) (HCC)   . Endometriosis   . CAD (coronary artery disease)   . HLD (hyperlipidemia)   . GERD (gastroesophageal reflux disease)      Surgical history Past Surgical History  Procedure Laterality Date  . Hysterotomy    . Below knee leg amputation    . Pd tube inserstion    . Hd fistula surgery with reversal    . Cardiac catheterization Right 07/10/2015    Procedure: Left Heart Cath and Coronary Angiography;  Surgeon: Laurier Nancy, MD;  Location: [facility] INVASIVE CV LAB;  Service:  Cardiovascular;  Laterality: Right;     Family history Family History  Problem Relation Age of Onset  . CAD    . Diabetes    . Bipolar disorder    . Cervical cancer Mother      Social History Social History  Substance Use Topics  . Smoking status: Current Every Day Smoker -- 0.50 packs/day    Types: Cigarettes  . Smokeless tobacco: None  . Alcohol Use: No     Allergies Allergies  Allergen Reactions  . Cephalosporins Anaphylaxis  . Penicillins Anaphylaxis and Other (See Comments)    Has patient had a PCN reaction causing immediate rash, facial/tongue/throat swelling, SOB or lightheadedness with hypotension: Yes Has patient had a PCN reaction causing severe rash involving mucus membranes or skin necrosis: No Has patient had a PCN reaction that required hospitalization No Has patient had a PCN reaction occurring within the last 10 years: No If all of the above answers are "NO", then may proceed with Cephalosporin use.  [name] Phenergan [Promethazine Hcl] Nausea And Vomiting      Medications . antiseptic oral rinse  7 mL Mouth Rinse BID  . ARIPiprazole  2 mg Oral Daily  . aspirin EC  81 mg Oral Daily  . bisacodyl  10 mg Rectal Daily  . calcium acetate  2,001 mg Oral TID WC  . calcium carbonate  1 tablet Oral TID WC  . calcium gluconate  1 g Intravenous BID  . carvedilol  6.25 mg Oral BID  WC  . clindamycin (CLEOCIN) IV  600 mg Intravenous Q8H  . clopidogrel  75 mg Oral Daily  . collagenase   Topical Daily  . dialysis solution 2.5% low-MG/low-CA   Intraperitoneal Q24H  . docusate sodium  100 mg Oral BID  . erythromycin  250 mg Intravenous Q8H  . ezetimibe  10 mg Oral QHS  . gentamicin cream  1 application Topical Daily  . heparin  5,000 Units Subcutaneous 3 times per day  . insulin aspart  0-5 Units Subcutaneous QHS  . insulin aspart  0-9 Units Subcutaneous TID WC  . isosorbide mononitrate  60 mg Oral Daily  . levETIRAcetam  500 mg Intravenous Q12H  . nystatin  1  Bottle Topical BID  . pantoprazole  40 mg Oral BID  . polyethylene glycol  17 g Oral Daily  . ranolazine  500 mg Oral BID  . sodium chloride flush  3 mL Intravenous Q12H  . tuberculin  5 Units Intradermal Once  . valproate sodium  1,000 mg Intravenous Q12H     Review of Systems GENERAL:  Patient denies weight-loss, weight-gain or fevers.   NEUROLOGIC:  Patient has occasional headaches. EYES:  Patient denies blurred vision. NECK:  Patient denies neck swelling or neck pain. CARDIAC:  Patient denies chest pain or palpitations. RESPIRATORY:  Patient denies SOB.  Patient denies cough.   GASTROINTESTINAL:  Patient denies abdominal pain, nausea and vomiting and constipation.    GENITOURINARY:  Patient denies dysuria and hematuria.  EXTREMITIES:  Patient denies leg swelling. HEME:  Patient denies easy bruisability. SKIN:  Patient denies any rash.    Exam BP 93/40 mmHg  Pulse 65  Temp(Src) 97.7 F (36.5 C) (Oral)  Resp 14  Ht 5' 9.02" (1.753 m)  Wt 126.644 kg (279 lb 3.2 oz)  BMI 41.21 kg/m2  SpO2 94%  LMP 03/16/2015  GEN: Obese white, chronically ill appearing female, in NAD. HEENT: No proptosis, lid lag or stare. Oropharynx is clear.   NECK: supple, trachea midline, thyroid exam demonstrates no TTP, no thyromegaly, no palpable nodules.  RESPIRATORY: clear bilaterally, no wheeze, good inspiratory effort. CV: No carotid bruits, RRR. MUSCULOSKELETAL: S/P prior rt BKA. No tremor of outstretched hands. ABD: soft, NT/ND, no organomegaly EXT: no edema SKIN: no dermatopathy or rash or acanthosis nigricans LYMPH: no submandibular or supraclavicular LAD NEURO: PERRL, EOMI. PSYC: alert and oriented, fair insight  Assessment Subclinical hypothyroidism  Plan Patient with mildly elevated TSH today, which has high-normal two days ago. This is consistent with mild subclinical hypothyroidism. May self resolve. Treatment no recommended as there is no clinical benefit. Recommend she have a  follow up TSH level in 6-8 weeks after hospital discharge, once clinically improve, to assess if it has resolved or worsened. Would only consider adding thyroid hormone replacement if TSH > 10. If TSH in the 4.5 - 10 uIU/ml range, there is no clinical benefit to giving levothyroxine in this individual.  No role for thyroid imaging, as she does not have a goiter or palpable thyroid nodules.  Thank you for the kind request for consultation.  Please call if there are questions or concerns.

## 2015-07-28 NOTE — Progress Notes (Signed)
Patient is up in chair, NG clamped, patient has no c/o of N/V. Patient is requesting something to eat. Paged MD for diet order. Per Dr. Amado Coe okay to start patient on clear liq diet. Per MD if patient can tolerate diet okay d/c NG tube. Will continue to monitor patient. Rudean Haskell

## 2015-07-28 NOTE — Progress Notes (Signed)
Subjective: Patient continues to report that she has jerking early in the morning but the rest of the day goes well.    Objective: Current vital signs: BP 93/40 mmHg  Pulse 65  Temp(Src) 97.7 F (36.5 C) (Oral)  Resp 14  Ht 5' 9.02" (1.753 m)  Wt 126.644 kg (279 lb 3.2 oz)  BMI 41.21 kg/m2  SpO2 94%  LMP 03/16/2015 Vital signs in last 24 hours: Temp:  [97.4 F (36.3 C)-97.7 F (36.5 C)] 97.7 F (36.5 C) (04/28 1131) Pulse Rate:  [60-65] 65 (04/28 1131) Resp:  [14-16] 14 (04/28 1131) BP: (93-123)/(40-46) 93/40 mmHg (04/28 1131) SpO2:  [93 %-94 %] 94 % (04/28 1131) Weight:  [126.644 kg (279 lb 3.2 oz)] 126.644 kg (279 lb 3.2 oz) (04/28 0626)  Intake/Output from previous day: 04/27 0701 - 04/28 0700 In: 153 [I.V.:3; IV Piggyback:150] Out: 3323 [Emesis/NG output:700] Intake/Output this shift: Total I/O In: -  Out: 632 [Other:632] Nutritional status: Diet - low sodium heart healthy Diet - low sodium heart healthy Diet - low sodium heart healthy Diet NPO time specified  Neurologic Exam: No myoclonic jerks noted  Lab Results: Basic Metabolic Panel:  Recent Labs Lab 07/23/15 0435 07/25/15 0454 07/26/15 0357 07/27/15 0403 07/28/15 0420  NA 130* 129* 127* 131* 134*  K 3.9 4.4 4.4 4.2 3.7  CL 91* 89* 87* 89* 90*  CO2 23 26 23 26 27   GLUCOSE 118* 91 118* 167* 99  BUN 61* 61* 60* 65* 61*  CREATININE 10.25* 10.58* 10.85* 11.58* 11.82*  CALCIUM 6.1* 6.1* 6.2* 5.8* 5.4*  PHOS  --   --  8.6*  --   --     Liver Function Tests:  Recent Labs Lab 07/25/15 0454 07/26/15 1449 07/28/15 0420  AST 17 16 21   ALT 13* 11* 11*  ALKPHOS 83 84 76  BILITOT 0.6 0.5 0.6  PROT 5.1* 4.9* 4.8*  ALBUMIN 1.8* 1.7* 1.7*   No results for input(s): LIPASE, AMYLASE in the last 168 hours. No results for input(s): AMMONIA in the last 168 hours.  CBC:  Recent Labs Lab 07/21/15 2150 07/23/15 0435 07/25/15 0454 07/26/15 0357 07/27/15 0403 07/28/15 0420  WBC 14.5* 13.7* 10.8  10.6 8.2 7.7  NEUTROABS 11.9*  --   --   --   --   --   HGB 8.1* 7.4* 7.8* 7.3* 7.0* 7.3*  HCT 24.6* 22.1* 23.2* 21.7* 20.7* 21.9*  MCV 83.0 82.5 83.2 83.2 83.6 85.0  PLT 302 263 296 280 252 262    Cardiac Enzymes:  Recent Labs Lab 07/21/15 2150  TROPONINI <0.03    Lipid Panel: No results for input(s): CHOL, TRIG, HDL, CHOLHDL, VLDL, LDLCALC in the last 168 hours.  CBG:  Recent Labs Lab 07/27/15 2309 07/28/15 0634 07/28/15 0749 07/28/15 1013 07/28/15 1134  GLUCAP 133* 104* 104* 102* 105*    Microbiology: Results for orders placed or performed during the hospital encounter of 07/15/15  Body fluid culture     Status: None   Collection Time: 07/15/15 10:23 PM  Result Value Ref Range Status   Specimen Description PERITONEAL  Final   Special Requests Normal  Final   Gram Stain RARE WBC SEEN NO ORGANISMS SEEN   Final   Culture No growth aerobically or anaerobically.  Final   Report Status 07/19/2015 FINAL  Final  Culture, blood (routine x 2)     Status: None   Collection Time: 07/16/15  3:58 AM  Result Value Ref Range Status  Specimen Description BLOOD RIGHT WRIST  Final   Special Requests   Final    BOTTLES DRAWN AEROBIC AND ANAEROBIC 12CCAERO,6CCANA   Culture NO GROWTH 5 DAYS  Final   Report Status 07/21/2015 FINAL  Final  Culture, blood (routine x 2)     Status: None   Collection Time: 07/16/15  3:58 AM  Result Value Ref Range Status   Specimen Description BLOOD RIGHT FOREARM  Final   Special Requests   Final    BOTTLES DRAWN AEROBIC AND ANAEROBIC 12CCAERO,2CCANA   Culture NO GROWTH 5 DAYS  Final   Report Status 07/21/2015 FINAL  Final  MRSA PCR Screening     Status: None   Collection Time: 07/21/15 10:45 PM  Result Value Ref Range Status   MRSA by PCR NEGATIVE NEGATIVE Final    Comment:        The GeneXpert MRSA Assay (FDA approved for NASAL specimens only), is one component of a comprehensive MRSA colonization surveillance program. It is  not intended to diagnose MRSA infection nor to guide or monitor treatment for MRSA infections.     Coagulation Studies: No results for input(s): LABPROT, INR in the last 72 hours.  Imaging: Dg Abd 1 View  07/27/2015  CLINICAL DATA:  NG tube placement, ileus, abd pain, pt admitted 8 days ago for n/v EXAM: ABDOMEN - 1 VIEW COMPARISON:  07/24/2015 FINDINGS: Nasogastric tube tip projects in the right upper quadrant consistent with a in the distal stomach. Stomach appears decompressed. Moderate colonic stool burden evident in the right abdomen. No evidence of bowel obstruction. The IMPRESSION: Nasogastric tube well positioned. Tip projects in the distal stomach Electronically Signed   By: Amie Portland M.D.   On: 07/27/2015 14:00    Medications:  I have reviewed the patient's current medications. Scheduled: . antiseptic oral rinse  7 mL Mouth Rinse BID  . ARIPiprazole  2 mg Oral Daily  . aspirin EC  81 mg Oral Daily  . calcium acetate  2,001 mg Oral TID WC  . calcium carbonate  1 tablet Oral TID WC  . calcium gluconate  1 g Intravenous BID  . carvedilol  6.25 mg Oral BID WC  . clindamycin (CLEOCIN) IV  600 mg Intravenous Q8H  . clopidogrel  75 mg Oral Daily  . dialysis solution 2.5% low-MG/low-CA   Intraperitoneal Q24H  . docusate sodium  100 mg Oral BID  . erythromycin  250 mg Intravenous Q8H  . ezetimibe  10 mg Oral QHS  . gentamicin cream  1 application Topical Daily  . heparin  5,000 Units Subcutaneous 3 times per day  . insulin aspart  0-5 Units Subcutaneous QHS  . insulin aspart  0-9 Units Subcutaneous TID WC  . isosorbide mononitrate  60 mg Oral Daily  . levETIRAcetam  500 mg Intravenous Q12H  . levothyroxine  25 mcg Intravenous Daily  . nystatin  1 Bottle Topical BID  . pantoprazole  40 mg Oral BID  . polyethylene glycol  17 g Oral Daily  . ranolazine  500 mg Oral BID  . sodium chloride flush  3 mL Intravenous Q12H  . tuberculin  5 Units Intradermal Once  . valproate  sodium  1,000 mg Intravenous Q12H    Assessment/Plan: Patient continues to have early morning jerks.  The once a day dosing does not appear to be holding her for the entire 24 hours.    Recommendations: 1.  Change Keppra to 500mg  BID   LOS: 12 days  Thana Farr, MD Neurology [phone] 07/28/2015  12:03 PM

## 2015-07-28 NOTE — Progress Notes (Signed)
NG tube clamped for 7 hours. Patient tolerated clear liq diet. No complaints of N/V. NG tube removed. Will continue to monitor patient. Kimberly Montgomery

## 2015-07-28 NOTE — Progress Notes (Signed)
Subjective:     Poor PO intake.  + peripheral edema, especially in thigh region, now with reddish rash on the left thigh and lower abdomen PD went OK UF of 632 cc last night NG tube placed 4/27- >500 cc dark aspirate obtained  Objective:  Vital signs in last 24 hours:  Temp:  [97.4 F (36.3 C)-97.7 F (36.5 C)] 97.7 F (36.5 C) (04/28 1131) Pulse Rate:  [60-65] 65 (04/28 1131) Resp:  [14-16] 14 (04/28 1131) BP: (93-123)/(40-46) 93/40 mmHg (04/28 1131) SpO2:  [93 %-94 %] 94 % (04/28 1131) Weight:  [126.644 kg (279 lb 3.2 oz)] 126.644 kg (279 lb 3.2 oz) (04/28 0626)  Weight change: -2.359 kg (-5 lb 3.2 oz) Filed Weights   07/26/15 1702 07/27/15 0418 07/28/15 0626  Weight: 129.003 kg (284 lb 6.4 oz) 128.096 kg (282 lb 6.4 oz) 126.644 kg (279 lb 3.2 oz)    Intake/Output:    Intake/Output Summary (Last 24 hours) at 07/28/15 1358 Last data filed at 07/28/15 1233  Gross per 24 hour  Intake    213 ml  Output    882 ml  Net   -669 ml     Physical Exam: General: NAD  HEENT Anicteric, moist oral mucous membranes, NG ube  Neck supple  Pulm/lungs Normal effort, clear to auscultation bilaterally  CVS/Heart No rub or gallop  Abdomen:  Soft, nontender  Extremities: + dependent peripheral edema, right BKA  Neurologic:  Alert, oriented  Skin: Reddish rash over left thigh, left lower abdomen  Access: PD catheter, exit site nontender        Basic Metabolic Panel:   Recent Labs Lab 07/23/15 0435 07/25/15 0454 07/26/15 0357 07/27/15 0403 07/28/15 0420  NA 130* 129* 127* 131* 134*  K 3.9 4.4 4.4 4.2 3.7  CL 91* 89* 87* 89* 90*  CO2 23 26 23 26 27   GLUCOSE 118* 91 118* 167* 99  BUN 61* 61* 60* 65* 61*  CREATININE 10.25* 10.58* 10.85* 11.58* 11.82*  CALCIUM 6.1* 6.1* 6.2* 5.8* 5.4*  PHOS  --   --  8.6*  --   --      CBC:  Recent Labs Lab 07/21/15 2150 07/23/15 0435 07/25/15 0454 07/26/15 0357 07/27/15 0403 07/28/15 0420  WBC 14.5* 13.7* 10.8 10.6 8.2 7.7   NEUTROABS 11.9*  --   --   --   --   --   HGB 8.1* 7.4* 7.8* 7.3* 7.0* 7.3*  HCT 24.6* 22.1* 23.2* 21.7* 20.7* 21.9*  MCV 83.0 82.5 83.2 83.2 83.6 85.0  PLT 302 263 296 280 252 262      Microbiology:  Recent Results (from the past 720 hour(s))  Body fluid culture     Status: None   Collection Time: 07/15/15 10:23 PM  Result Value Ref Range Status   Specimen Description PERITONEAL  Final   Special Requests Normal  Final   Gram Stain RARE WBC SEEN NO ORGANISMS SEEN   Final   Culture No growth aerobically or anaerobically.  Final   Report Status 07/19/2015 FINAL  Final  Culture, blood (routine x 2)     Status: None   Collection Time: 07/16/15  3:58 AM  Result Value Ref Range Status   Specimen Description BLOOD RIGHT WRIST  Final   Special Requests   Final    BOTTLES DRAWN AEROBIC AND ANAEROBIC 12CCAERO,6CCANA   Culture NO GROWTH 5 DAYS  Final   Report Status 07/21/2015 FINAL  Final  Culture, blood (routine x 2)  Status: None   Collection Time: 07/16/15  3:58 AM  Result Value Ref Range Status   Specimen Description BLOOD RIGHT FOREARM  Final   Special Requests   Final    BOTTLES DRAWN AEROBIC AND ANAEROBIC 12CCAERO,2CCANA   Culture NO GROWTH 5 DAYS  Final   Report Status 07/21/2015 FINAL  Final  MRSA PCR Screening     Status: None   Collection Time: 07/21/15 10:45 PM  Result Value Ref Range Status   MRSA by PCR NEGATIVE NEGATIVE Final    Comment:        The GeneXpert MRSA Assay (FDA approved for NASAL specimens only), is one component of a comprehensive MRSA colonization surveillance program. It is not intended to diagnose MRSA infection nor to guide or monitor treatment for MRSA infections.     Coagulation Studies: No results for input(s): LABPROT, INR in the last 72 hours.  Urinalysis: No results for input(s): COLORURINE, LABSPEC, PHURINE, GLUCOSEU, HGBUR, BILIRUBINUR, KETONESUR, PROTEINUR, UROBILINOGEN, NITRITE, LEUKOCYTESUR in the last 72  hours.  Invalid input(s): APPERANCEUR    Imaging: Dg Abd 1 View  07/27/2015  CLINICAL DATA:  NG tube placement, ileus, abd pain, pt admitted 8 days ago for n/v EXAM: ABDOMEN - 1 VIEW COMPARISON:  07/24/2015 FINDINGS: Nasogastric tube tip projects in the right upper quadrant consistent with a in the distal stomach. Stomach appears decompressed. Moderate colonic stool burden evident in the right abdomen. No evidence of bowel obstruction. The IMPRESSION: Nasogastric tube well positioned. Tip projects in the distal stomach Electronically Signed   By: Amie Portland M.D.   On: 07/27/2015 14:00     Medications:     . antiseptic oral rinse  7 mL Mouth Rinse BID  . ARIPiprazole  2 mg Oral Daily  . aspirin EC  81 mg Oral Daily  . bisacodyl  10 mg Rectal Daily  . calcium acetate  2,001 mg Oral TID WC  . calcium carbonate  1 tablet Oral TID WC  . calcium gluconate  1 g Intravenous BID  . carvedilol  6.25 mg Oral BID WC  . clindamycin (CLEOCIN) IV  600 mg Intravenous Q8H  . clopidogrel  75 mg Oral Daily  . collagenase   Topical Daily  . dialysis solution 2.5% low-MG/low-CA   Intraperitoneal Q24H  . docusate sodium  100 mg Oral BID  . erythromycin  250 mg Intravenous Q8H  . ezetimibe  10 mg Oral QHS  . gentamicin cream  1 application Topical Daily  . heparin  5,000 Units Subcutaneous 3 times per day  . insulin aspart  0-5 Units Subcutaneous QHS  . insulin aspart  0-9 Units Subcutaneous TID WC  . isosorbide mononitrate  60 mg Oral Daily  . levETIRAcetam  500 mg Intravenous Q12H  . levothyroxine  25 mcg Intravenous Daily  . nystatin  1 Bottle Topical BID  . pantoprazole  40 mg Oral BID  . polyethylene glycol  17 g Oral Daily  . ranolazine  500 mg Oral BID  . sodium chloride flush  3 mL Intravenous Q12H  . tuberculin  5 Units Intradermal Once  . valproate sodium  1,000 mg Intravenous Q12H   sodium chloride, acetaminophen **OR** acetaminophen, albuterol, docusate sodium, heparin, lactulose,  LORazepam, metoprolol, ondansetron **OR** ondansetron (ZOFRAN) IV, oxyCODONE-acetaminophen, polyethylene glycol, prochlorperazine, sodium chloride flush, zinc oxide  Assessment/ Plan:  50 y.o. white  female with past medical history of end-stage renal disease on peritoneal dialysis, asthma, gastroparesis, diabetes mellitus, congestive heart failure, hypertension, endometriosis,  coronary atherosclerosis, medical management  [facility]/[address]/peritoneal dialysis  1. End-stage renal disease on peritoneal dialysis.  CCPD- 4 exchanges 8 hours with 2.2 litre fills. 2.5% dextrose,   - nightly PD - UF 630 cc last night   2. Acute peritonitis in PD patient. WBC cell count 690 with 73% neutrophils on admission.  - PD fluid culture is no growth. WBC count 21 on 4/25 - Empiric treatment with broad-spectrum antibiotics vancomycin (started 4/16- most recent dose 4/22) and Levaquin.   - VANC level 20 on 4/25  3. Anemia chronic kidney disease. - epo as outpatient. Holding now with neurologic changes, seizures.   4. Secondary hyperparathyroidism. phos elevated at 11.7 on 4/20.- > 8.6  - calcium acetate with meals once able to tolerate diet  5. Nausea and vomiting - NG tube placed 4/27 - Changed erythromycin to iv - Patient had side effects to reglan in the past  6. Dependent generalized edema - UF with PD - use 2.5% bags     LOS: 12 Kimberly Montgomery 4/28/20171:58 PM

## 2015-07-28 NOTE — Consult Note (Signed)
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  Patient was not aware of the lesion.  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  Periwound:intact Will begin enzymatic debridement.   Educated regarding the importance of examining her foot daily and following up on all nonintact areas.  Dressing procedure/placement/frequency:Cleanse left great toe with NS and pat gently dry.  Apply Santyl to wound bed.  Cover with NS moist gauze.  Wrap with kerlix and tape.  Change daily.  Will not follow at this time.  Please re-consult if needed.  Maple Hudson RN BSN CWON Pager [phone]

## 2015-07-28 NOTE — Plan of Care (Signed)
Problem: Activity: Goal: Risk for activity intolerance will decrease Outcome: Progressing Patient up in chair   Problem: Nutrition: Goal: Adequate nutrition will be maintained Outcome: Progressing Patient advanced to clear liq diet.

## 2015-07-28 NOTE — Progress Notes (Signed)
Patient ID: Kimberly Montgomery, female   DOB: 02-May-1965, 50 y.o.   MRN: [id number] [place] [facility]   PATIENT NAME: Kimberly Montgomery    MR#:  [id number]  DATE OF BIRTH:  July 01, 1965  SUBJECTIVE:   Patient's  nausea and vomiting are better. Abd pain is better. Requesting to clamp the NG tube No fever. Reporting redness in the groin area bilaterally  REVIEW OF SYSTEMS:   Review of Systems  Constitutional: Negative for fever, chills and weight loss.  HENT: Negative for ear discharge, ear pain and nosebleeds.   Eyes: Negative for blurred vision, pain and discharge.  Respiratory: Negative for sputum production, shortness of breath, wheezing and stridor.   Cardiovascular: Negative for chest pain, palpitations, orthopnea and PND.  Gastrointestinal: Positive for nausea and abdominal pain. Negative for vomiting, diarrhea, blood in stool and melena.  Genitourinary: Negative for urgency and frequency.  Musculoskeletal: Negative for back pain and joint pain.  Skin: Positive for rash.       Redness in the groin area- Rash on the abdomen  Neurological: Positive for weakness. Negative for sensory change, speech change, focal weakness and seizures.  Psychiatric/Behavioral: Positive for memory loss. Negative for depression and hallucinations. The patient is not nervous/anxious.    Tolerating Diet:yes  Tolerating PT: rec rehab  DRUG ALLERGIES:   Allergies  Allergen Reactions  . Cephalosporins Anaphylaxis  . Penicillins Anaphylaxis and Other (See Comments)    Has patient had a PCN reaction causing immediate rash, facial/tongue/throat swelling, SOB or lightheadedness with hypotension: Yes Has patient had a PCN reaction causing severe rash involving mucus membranes or skin necrosis: No Has patient had a PCN reaction that required hospitalization No Has patient had a PCN reaction occurring within the last 10 years: No If all of the above answers are "NO", then may proceed  with Cephalosporin use.  [name] Phenergan [Promethazine Hcl] Nausea And Vomiting    VITALS:  Blood pressure 93/40, pulse 65, temperature 97.7 F (36.5 C), temperature source Oral, resp. rate 14, height 5' 9.02" (1.753 m), weight 126.644 kg (279 lb 3.2 oz), last menstrual period 03/16/2015, SpO2 94 %.  PHYSICAL EXAMINATION:   Physical Exam  Constitutional: She is oriented to person, place, and time and well-developed, well-nourished, and in no distress. No distress.  HENT:  Head: Normocephalic.  Eyes: No scleral icterus.  Neck: Normal range of motion. Neck supple. No JVD present. No tracheal deviation present.  Cardiovascular: Normal rate, regular rhythm and normal heart sounds.  Exam reveals no gallop and no friction rub.   No murmur heard. Pulmonary/Chest: Effort normal. No respiratory distress. She has no wheezes. She has no rales. She exhibits no tenderness.  Abdominal: Soft. She exhibits no distension and no mass. There is no tenderness. There is no rebound and no guarding.  Hypoactive bowel sounds in 2 quadrants Lower abdominal area and groin area is erythematous with macular rash  Musculoskeletal: Normal range of motion. She exhibits no edema.  Right prosetic leg Left toe with erythema  Neurological: She is alert and oriented to person, place, and time.  Slow but oriented 3  Skin: Skin is warm. No rash noted.  Psychiatric: Mood and judgment normal.      LABORATORY PANEL:  CBC  Recent Labs Lab 07/28/15 0420  WBC 7.7  HGB 7.3*  HCT 21.9*  PLT 262    Chemistries   Recent Labs Lab 07/28/15 0420  NA 134*  K 3.7  CL 90*  CO2 27  GLUCOSE 99  BUN 61*  CREATININE 11.82*  CALCIUM 5.4*  AST 21  ALT 11*  ALKPHOS 76  BILITOT 0.6   Cardiac Enzymes  Recent Labs Lab 07/21/15 2150  TROPONINI <0.03   RADIOLOGY:  Dg Abd 1 View  07/27/2015  CLINICAL DATA:  NG tube placement, ileus, abd pain, pt admitted 8 days ago for n/v EXAM: ABDOMEN - 1 VIEW COMPARISON:   07/24/2015 FINDINGS: Nasogastric tube tip projects in the right upper quadrant consistent with a in the distal stomach. Stomach appears decompressed. Moderate colonic stool burden evident in the right abdomen. No evidence of bowel obstruction. The IMPRESSION: Nasogastric tube well positioned. Tip projects in the distal stomach Electronically Signed   By: Amie Portland M.D.   On: 07/27/2015 14:00   ASSESSMENT AND PLAN:  50 year old female patient with history of end-stage renal disease on peritoneal dialysis, hypertension, coronary artery disease, congestive heart failure presented to the emergency room with nausea and vomiting and abdominal discomfort.   # Intractable nausea and vomiting secondary to severe gastroparesis with constipation and probably a component of peritonitis which is clinically improving Patient is nothing by mouth., NG tube placed for decompression. So far 150 mL of brownish fluid today, we will clamp it for the next 5-6 hours and see how patient is doing Continue IV erythromycin for gastroparesis Abdominal x-ray with no ileus or small bowel obstruction. Appreciate surgery recommendations, signed off. Discussed with Dr. Excell Seltzer, Dr. Wyn Quaker and Dr. Thedore Mins. Patient is not considering hemodialysis at this point Patient is afebrile. Surgical consult is placed. Check a.m. Labs. Patient is allergic to Reglan. We will continue Zofran dose is increased from 2 mg to 4 mg IV   #. Generalized tonic-clonic seizures new onset started on 07/21/2015 Continue Depakote 1000 mg twice a day and keppra dose increased to 1000 mg by neurology, patient is clinically feeling better. Her discussed with Dr. Thad Ranger EEG per Dr. Thad Ranger shows metabolic changes Repeat CT head 4/ 21 negative   # Acute peritonitis with nausea vomiting and diffuse abdominal pain. Peritoneal fluid with neutrophils greater than 70% initially, peritoneal fluid culture is negative Patient is clinically improving She was  treated with Levofloxacin 500 mg QOD for 10 days and vancomycin completed course. Consult infectious disease for their expert opinion if the patient needs more antibiotics  #Left lower abdominal wall and left thigh cellulitis Discontinued Bactrim as there is no clinical improvement with it. Started her on IV clindamycin Follow-up with infectious disease  #Elevated TSH with past medical history of goiter status post radiation Patient is started on IV Synthroid. Endocrinology consult is placed We will get ultrasound of the thyroid   # End-stage renal disease on peritoneal dialysis Continue peritoneal dialysis as recommended by nephrology.  Patient declining to convert to hemodialysis Spoke with Dr. Thedore Mins today , agrees with the current plan   #Hypocalcemia-provide calcium gluconate IV and recheck a.m. labs  #constipation Provide Dulcolax suppository and   soapsudsenema   #. Congestive heart failure, chronic, diastolic Continue home medication aspirin 81 mg, Plavix 75 mg, Coreg when patient starts tolerating by mouth Monitor for symptoms and signs of fluid overload   # Myoclonic jerks  due to gabapentin which was discontinued by Dr. Thad Ranger from neurology Still intermittent, Keppra dose is increased to 1 g  Wellbutrin was also discontinued as per Dr. Thad Ranger.  Psych consult recommended Abilify in its place.  #. Anemia of chronic kidney disease: Hemoglobin is stable.  #. Hyponatremia: Sodium  at 134, clinically improving   12. Left toe: WOUND care consult   had extensive discussion with daughter for a proximately 40 minutes, management and care of plane explained in detail. She is agreeable with the current plan of care  PT consult placed and OOB orders placed  Case discussed with case manager Management plans discussed with the patient and daughter and BOTH  are in agreement. Family would like to go  to [facility] when bed [facility] available  CODE STATUS:full   DVT Prophylaxis:  heparin TOTAL TIME TAKING CARE OF THIS PATIENT: 50 minutes.     Discharge in ? days if has bowel movement.   Note: This dictation was prepared with Dragon dictation along with smaller phrase technology. Any transcriptional errors that result from this process are unintentional.  Ramonita Lab M.D on 07/28/2015 at 12:22 PM  Between 7am to 6pm - Pager - [phone]  After 6pm go to [email or web address] - password [email or web address]  [facility]  Office  [phone]  CC: Primary care physician; Jarome Matin, MD

## 2015-07-28 NOTE — Care Management (Signed)
TC to [facility] transfer center ([phone]). Patient remains on the wait list with no bed available.

## 2015-07-28 NOTE — Progress Notes (Signed)
PT Cancellation Note  Patient Details Name: Kimberly Montgomery MRN: [id number] DOB: 1965-05-03   Cancelled Treatment:    Reason Eval/Treat Not Completed: Other (comment). Pt continues with decreasing Calcium levels, down to 5.4 this date. Pt is contraindicated for physical therapy at this time. Will continue to assess next date.   Jerett Odonohue 07/28/2015, 9:25 AM  Elizabeth Palau, PT, DPT [phone]

## 2015-07-28 NOTE — Progress Notes (Signed)
Initial Nutrition Assessment     INTERVENTION:  Monitor diet progression Pt familiar with renal diet restrictions as outpatient. No further education needed at this time   NUTRITION DIAGNOSIS:   Inadequate oral intake related to altered GI function as evidenced by NPO status.    GOAL:   Patient will meet greater than or equal to 90% of their needs    MONITOR:   PO intake, Diet advancement  REASON FOR ASSESSMENT:   LOS    ASSESSMENT:   50 y/o female admitted with peritonitis, nausea, vomiting.  Pt with ESRD on PD  Past Medical History  Diagnosis Date  . Renal disorder   . Hypertension   . CHF (congestive heart failure) (HCC)   . Diabetes mellitus without complication (HCC)   . Gastroparesis   . Asthma   . ESRD (end stage renal disease) (HCC)   . Endometriosis   . CAD (coronary artery disease)   . HLD (hyperlipidemia)   . GERD (gastroesophageal reflux disease)     Pt reports poor po intake for the past week, noted per chart eating 31% of meals on average.  Noted nausea and vomiting  Currently has NG tube in place  Medications reviewed dulcolax, phoslo, TUMS, colace, erythromycin, aspart, nystatin, miralax, lactulose Labs reviewed: Na 134, BUN 61, creatinine 11.82, calcium 5.4, hgb 7.3   Diet Order:  Diet - low sodium heart healthy Diet - low sodium heart healthy Diet - low sodium heart healthy Diet NPO time specified  Skin:  Reviewed, no issues  Last BM:  4/26  Height:   Ht Readings from Last 1 Encounters:  07/16/15 5' 9.02" (1.753 m)    Weight: stable wt prior to admission per pt  Wt Readings from Last 1 Encounters:  07/28/15 279 lb 3.2 oz (126.644 kg)    Ideal Body Weight:     BMI:  Body mass index is 41.21 kg/(m^2).  Estimated Nutritional Needs:   Kcal:  1980-2310 kcals/d  Protein:  (79-99 g/d  Fluid:  + UOP  EDUCATION NEEDS:   No education needs identified at this time  Melia Hopes B. Freida Busman, RD, LDN [phone]  (pager) Weekend/On-Call pager [phone])

## 2015-07-28 NOTE — Consult Note (Signed)
[facility] Infectious Disease     Reason for Consult:Peritonitis, cellulitis    Referring Physician: Gouru, A Date of Admission:  07/15/2015   Principal Problem:   Peritonitis [facility]) Active Problems:   Bipolar I disorder ([place])   Seizures ([place])   ESRD (end stage renal disease) ([place])   Ileus ([place])   HPI: Kimberly Montgomery is a 50 y.o. female admitted 4/16 with nv and d and abd pain. She has a hx of nd-stage renal disease on peritoneal dialysis, hypertension, DM, gastroparesis, coronary artery disease, congestive heart failure.  On admission she had peritoneal fluid drawn with Wbc 193 and 91% PMNS,  Cx negative. Started on levofloxacin and vanco and fu PD fluid with improved WBC. She has developed redness on bilateral flank over last few days No fevers, chills, wbc nml   Past Medical History  Diagnosis Date  . Renal disorder   . Hypertension   . CHF (congestive heart failure) ([place])   . Diabetes mellitus without complication ([place])   . Gastroparesis   . Asthma   . ESRD (end stage renal disease) ([place])   . Endometriosis   . CAD (coronary artery disease)   . HLD (hyperlipidemia)   . GERD (gastroesophageal reflux disease)    Past Surgical History  Procedure Laterality Date  . Hysterotomy    . Below knee leg amputation    . Pd tube inserstion    . Hd fistula surgery with reversal    . Cardiac catheterization Right 07/10/2015    Procedure: Left Heart Cath and Coronary Angiography;  Surgeon: Dionisio , MD;  Location: [facility];  Service: Cardiovascular;  Laterality: Right;   Social History  Substance Use Topics  . Smoking status: Current Every Day Smoker -- 0.50 packs/day    Types: Cigarettes  . Smokeless tobacco: None  . Alcohol Use: No   Family History  Problem Relation Age of Onset  . CAD    . Diabetes    . Bipolar disorder    . Cervical cancer Mother     Allergies:  Allergies  Allergen Reactions  . Cephalosporins Anaphylaxis  . Penicillins  Anaphylaxis and Other (See Comments)    Has patient had a PCN reaction causing immediate rash, facial/tongue/throat swelling, SOB or lightheadedness with hypotension: Yes Has patient had a PCN reaction causing severe rash involving mucus membranes or skin necrosis: No Has patient had a PCN reaction that required hospitalization No Has patient had a PCN reaction occurring within the last 10 years: No If all of the above answers are "NO", then may proceed with Cephalosporin use.  [name] Phenergan [Promethazine Hcl] Nausea And Vomiting    Current antibiotics: Antibiotics Given (last 72 hours)    Date/Time Action Medication Dose Rate   07/25/15 1643 Given   erythromycin (ERY-TAB) EC tablet 250 mg 250 mg    07/25/15 2113 Given   erythromycin (ERY-TAB) EC tablet 250 mg 250 mg    07/26/15 0819 Given   levofloxacin (LEVAQUIN) IVPB 500 mg 500 mg 100 mL/hr   07/26/15 0819 Given   erythromycin (ERY-TAB) EC tablet 250 mg 250 mg    07/26/15 1236 Given   erythromycin (ERY-TAB) EC tablet 250 mg 250 mg    07/26/15 1806 Given   erythromycin (ERY-TAB) EC tablet 250 mg 250 mg    07/26/15 1807 Given   sulfamethoxazole-trimethoprim (BACTRIM DS,SEPTRA DS) 800-160 MG per tablet 1 tablet 1 tablet    07/26/15 2317 Given   erythromycin (ERY-TAB) EC tablet  250 mg 250 mg    07/27/15 0826 Given   erythromycin (ERY-TAB) EC tablet 250 mg 250 mg    07/27/15 1458 Given   clindamycin (CLEOCIN) IVPB 600 mg 600 mg 100 mL/hr   07/27/15 1528 Given   erythromycin 250 mg in sodium chloride 0.9 % 100 mL IVPB 250 mg 100 mL/hr   07/27/15 2209 Given   erythromycin 250 mg in sodium chloride 0.9 % 100 mL IVPB 250 mg 100 mL/hr   07/27/15 2332 Given   clindamycin (CLEOCIN) IVPB 600 mg 600 mg 100 mL/hr   07/28/15 0521 Given   clindamycin (CLEOCIN) IVPB 600 mg 600 mg 100 mL/hr   07/28/15 0618 Given   erythromycin 250 mg in sodium chloride 0.9 % 100 mL IVPB 250 mg 100 mL/hr      MEDICATIONS: . antiseptic oral rinse  7 mL  Mouth Rinse BID  . ARIPiprazole  2 mg Oral Daily  . aspirin EC  81 mg Oral Daily  . bisacodyl  10 mg Rectal Daily  . calcium acetate  2,001 mg Oral TID WC  . calcium carbonate  1 tablet Oral TID WC  . calcium gluconate  1 g Intravenous BID  . carvedilol  6.25 mg Oral BID WC  . clindamycin (CLEOCIN) IV  600 mg Intravenous Q8H  . clopidogrel  75 mg Oral Daily  . collagenase   Topical Daily  . dialysis solution 2.5% low-MG/low-CA   Intraperitoneal Q24H  . docusate sodium  100 mg Oral BID  . erythromycin  250 mg Intravenous Q8H  . ezetimibe  10 mg Oral QHS  . gentamicin cream  1 application Topical Daily  . heparin  5,000 Units Subcutaneous 3 times per day  . insulin aspart  0-5 Units Subcutaneous QHS  . insulin aspart  0-9 Units Subcutaneous TID WC  . isosorbide mononitrate  60 mg Oral Daily  . levETIRAcetam  500 mg Intravenous Q12H  . levothyroxine  25 mcg Intravenous Daily  . nystatin  1 Bottle Topical BID  . pantoprazole  40 mg Oral BID  . polyethylene glycol  17 g Oral Daily  . ranolazine  500 mg Oral BID  . sodium chloride flush  3 mL Intravenous Q12H  . tuberculin  5 Units Intradermal Once  . valproate sodium  1,000 mg Intravenous Q12H    Review of Systems - 11 systems reviewed and negative per HPI   OBJECTIVE: Temp:  [97.4 F (36.3 C)-97.7 F (36.5 C)] 97.7 F (36.5 C) (04/28 1131) Pulse Rate:  [60-65] 65 (04/28 1131) Resp:  [14-16] 14 (04/28 1131) BP: (93-123)/(40-46) 93/40 mmHg (04/28 1131) SpO2:  [93 %-94 %] 94 % (04/28 1131) Weight:  [126.644 kg (279 lb 3.2 oz)] 126.644 kg (279 lb 3.2 oz) (04/28 3903) Physical Exam  Constitutional:  oriented to person, place, and time. Morbidly obese, chronically ill apperaing HENT: [place]/AT, PERRLA, no scleral icterus NGT in place Mouth/Throat: Oropharynx is clear and moist. No oropharyngeal exudate.  Cardiovascular: Normal rate, regular rhythm and normal heart sounds.  Pulmonary/Chest: Effort normal and breath sounds normal. No  respiratory distress.  has no wheezes.  Neck = supple, no nuchal rigidity Abdominal: distended, nontender, PD cath in palce Lymphadenopathy: no cervical adenopathy. No axillary adenopathy Neurological: alert and oriented to person, place, and time.  Skin: bil flanks in intertrig areas has some erythema, has some peau d'orange changes laterally on flanks from edema Psychiatric: a normal mood and affect.  behavior is normal.   LABS: Results for  orders placed or performed during the hospital encounter of 07/15/15 (from the past 48 hour(s))  Valproic acid level     Status: Abnormal   Collection Time: 07/26/15  2:49 PM  Result Value Ref Range   Valproic Acid Lvl 28 (L) 50.0 - 100.0 ug/mL  Hepatic function panel     Status: Abnormal   Collection Time: 07/26/15  2:49 PM  Result Value Ref Range   Total Protein 4.9 (L) 6.5 - 8.1 g/dL   Albumin 1.7 (L) 3.5 - 5.0 g/dL   AST 16 15 - 41 U/L   ALT 11 (L) 14 - 54 U/L   Alkaline Phosphatase 84 38 - 126 U/L   Total Bilirubin 0.5 0.3 - 1.2 mg/dL   Bilirubin, Direct <0.1 (L) 0.1 - 0.5 mg/dL   Indirect Bilirubin NOT CALCULATED 0.3 - 0.9 mg/dL  Glucose, capillary     Status: Abnormal   Collection Time: 07/26/15  4:38 PM  Result Value Ref Range   Glucose-Capillary 111 (H) 65 - 99 mg/dL  Glucose, capillary     Status: Abnormal   Collection Time: 07/26/15  9:10 PM  Result Value Ref Range   Glucose-Capillary 101 (H) 65 - 99 mg/dL   Comment 1 Notify RN   CBC     Status: Abnormal   Collection Time: 07/27/15  4:03 AM  Result Value Ref Range   WBC 8.2 3.6 - 11.0 K/uL   RBC 2.47 (L) 3.80 - 5.20 MIL/uL   Hemoglobin 7.0 (L) 12.0 - 16.0 g/dL   HCT 20.7 (L) 35.0 - 47.0 %   MCV 83.6 80.0 - 100.0 fL   MCH 28.1 26.0 - 34.0 pg   MCHC 33.6 32.0 - 36.0 g/dL   RDW 13.5 11.5 - 14.5 %   Platelets 252 150 - 440 K/uL  Basic metabolic panel     Status: Abnormal   Collection Time: 07/27/15  4:03 AM  Result Value Ref Range   Sodium 131 (L) 135 - 145 mmol/L    Potassium 4.2 3.5 - 5.1 mmol/L   Chloride 89 (L) 101 - 111 mmol/L   CO2 26 22 - 32 mmol/L   Glucose, Bld 167 (H) 65 - 99 mg/dL   BUN 65 (H) 6 - 20 mg/dL   Creatinine, Ser 11.58 (H) 0.44 - 1.00 mg/dL   Calcium 5.8 (LL) 8.9 - 10.3 mg/dL    Comment: CRITICAL RESULT CALLED TO, READ BACK BY AND VERIFIED WITH LEXI MILLER @ 0440 ON 07/27/2015 BY [facility]    GFR calc non Af Amer 3 (L) >60 mL/min   GFR calc Af Amer 4 (L) >60 mL/min    Comment: (NOTE) The eGFR has been calculated using the CKD EPI equation. This calculation has not been validated in all clinical situations. eGFR's persistently <60 mL/min signify possible Chronic Kidney Disease.    Anion gap 16 (H) 5 - 15  Glucose, capillary     Status: Abnormal   Collection Time: 07/27/15  7:35 AM  Result Value Ref Range   Glucose-Capillary 139 (H) 65 - 99 mg/dL   Comment 1 Notify RN   Glucose, capillary     Status: Abnormal   Collection Time: 07/27/15 12:00 PM  Result Value Ref Range   Glucose-Capillary 119 (H) 65 - 99 mg/dL   Comment 1 Notify RN   Glucose, capillary     Status: None   Collection Time: 07/27/15  4:25 PM  Result Value Ref Range   Glucose-Capillary 98 65 - 99 mg/dL  Comment 1 Notify RN   Glucose, capillary     Status: None   Collection Time: 07/27/15  8:41 PM  Result Value Ref Range   Glucose-Capillary 78 65 - 99 mg/dL  Glucose, capillary     Status: None   Collection Time: 07/27/15  9:09 PM  Result Value Ref Range   Glucose-Capillary 77 65 - 99 mg/dL  Glucose, capillary     Status: None   Collection Time: 07/27/15 10:31 PM  Result Value Ref Range   Glucose-Capillary 67 65 - 99 mg/dL  Glucose, capillary     Status: Abnormal   Collection Time: 07/27/15 11:09 PM  Result Value Ref Range   Glucose-Capillary 133 (H) 65 - 99 mg/dL  CBC     Status: Abnormal   Collection Time: 07/28/15  4:20 AM  Result Value Ref Range   WBC 7.7 3.6 - 11.0 K/uL   RBC 2.58 (L) 3.80 - 5.20 MIL/uL   Hemoglobin 7.3 (L) 12.0 - 16.0 g/dL    HCT 21.9 (L) 35.0 - 47.0 %   MCV 85.0 80.0 - 100.0 fL   MCH 28.3 26.0 - 34.0 pg   MCHC 33.3 32.0 - 36.0 g/dL   RDW 13.8 11.5 - 14.5 %   Platelets 262 150 - 440 K/uL  Comprehensive metabolic panel     Status: Abnormal   Collection Time: 07/28/15  4:20 AM  Result Value Ref Range   Sodium 134 (L) 135 - 145 mmol/L   Potassium 3.7 3.5 - 5.1 mmol/L   Chloride 90 (L) 101 - 111 mmol/L   CO2 27 22 - 32 mmol/L   Glucose, Bld 99 65 - 99 mg/dL   BUN 61 (H) 6 - 20 mg/dL   Creatinine, Ser 11.82 (H) 0.44 - 1.00 mg/dL   Calcium 5.4 (LL) 8.9 - 10.3 mg/dL    Comment: CRITICAL RESULT CALLED TO, READ BACK BY AND VERIFIED WITH LEXY MILLER ON 07/28/15 AT 0709 BY [facility]    Total Protein 4.8 (L) 6.5 - 8.1 g/dL   Albumin 1.7 (L) 3.5 - 5.0 g/dL   AST 21 15 - 41 U/L   ALT 11 (L) 14 - 54 U/L   Alkaline Phosphatase 76 38 - 126 U/L   Total Bilirubin 0.6 0.3 - 1.2 mg/dL   GFR calc non Af Amer 3 (L) >60 mL/min   GFR calc Af Amer 4 (L) >60 mL/min    Comment: (NOTE) The eGFR has been calculated using the CKD EPI equation. This calculation has not been validated in all clinical situations. eGFR's persistently <60 mL/min signify possible Chronic Kidney Disease.    Anion gap 17 (H) 5 - 15  TSH     Status: Abnormal   Collection Time: 07/28/15  4:20 AM  Result Value Ref Range   TSH 6.109 (H) 0.350 - 4.500 uIU/mL  Glucose, capillary     Status: Abnormal   Collection Time: 07/28/15  6:34 AM  Result Value Ref Range   Glucose-Capillary 104 (H) 65 - 99 mg/dL  Glucose, capillary     Status: Abnormal   Collection Time: 07/28/15  7:49 AM  Result Value Ref Range   Glucose-Capillary 104 (H) 65 - 99 mg/dL   Comment 1 Notify RN   Glucose, capillary     Status: Abnormal   Collection Time: 07/28/15 10:13 AM  Result Value Ref Range   Glucose-Capillary 102 (H) 65 - 99 mg/dL   Comment 1 Notify RN   Glucose, capillary  Status: Abnormal   Collection Time: 07/28/15 11:34 AM  Result Value Ref Range   Glucose-Capillary  105 (H) 65 - 99 mg/dL   Comment 1 Notify RN    No components found for: ESR, C REACTIVE PROTEIN MICRO: Recent Results (from the past 720 hour(s))  Body fluid culture     Status: None   Collection Time: 07/15/15 10:23 PM  Result Value Ref Range Status   Specimen Description PERITONEAL  Final   Special Requests Normal  Final   Gram Stain RARE WBC SEEN NO ORGANISMS SEEN   Final   Culture No growth aerobically or anaerobically.  Final   Report Status 07/19/2015 FINAL  Final  Culture, blood (routine x 2)     Status: None   Collection Time: 07/16/15  3:58 AM  Result Value Ref Range Status   Specimen Description BLOOD RIGHT WRIST  Final   Special Requests   Final    BOTTLES DRAWN AEROBIC AND ANAEROBIC 12CCAERO,6CCANA   Culture NO GROWTH 5 DAYS  Final   Report Status 07/21/2015 FINAL  Final  Culture, blood (routine x 2)     Status: None   Collection Time: 07/16/15  3:58 AM  Result Value Ref Range Status   Specimen Description BLOOD RIGHT FOREARM  Final   Special Requests   Final    BOTTLES DRAWN AEROBIC AND ANAEROBIC 12CCAERO,2CCANA   Culture NO GROWTH 5 DAYS  Final   Report Status 07/21/2015 FINAL  Final  MRSA PCR Screening     Status: None   Collection Time: 07/21/15 10:45 PM  Result Value Ref Range Status   MRSA by PCR NEGATIVE NEGATIVE Final    Comment:        The GeneXpert MRSA Assay (FDA approved for NASAL specimens only), is one component of a comprehensive MRSA colonization surveillance program. It is not intended to diagnose MRSA infection nor to guide or monitor treatment for MRSA infections.     IMAGING: Ct Abdomen Pelvis Wo Contrast  07/15/2015  CLINICAL DATA:  Nausea, vomiting and diarrhea with abdominal pain for 4 days. End-stage renal disease. EXAM: CT ABDOMEN AND PELVIS WITHOUT CONTRAST TECHNIQUE: Multidetector CT imaging of the abdomen and pelvis was performed following the standard protocol without IV contrast. COMPARISON:  11/13/2013 CT abdomen/pelvis.  FINDINGS: Lower chest: No significant pulmonary nodules or acute consolidative airspace disease. Coronary atherosclerosis. Coarse mitral annular calcification. Hepatobiliary: Normal liver with no liver mass. Gallbladder is filled with high density material. No gallbladder wall thickening or pericholecystic fluid. No biliary ductal dilatation. Pancreas: Normal, with no mass or duct dilation. Spleen: Normal size. No mass. Adrenals/Urinary Tract: Normal adrenals. Calcifications in the bilateral renal sinuses are favored to represent vascular calcifications. No hydronephrosis. No contour deforming renal masses. The normal caliber ureters, with no ureteral stones. Normal bladder. Stomach/Bowel: Small hiatal hernia. Otherwise grossly normal stomach. Normal caliber small bowel with no small bowel wall thickening. Normal appendix Normal large bowel with no diverticulosis, large bowel wall thickening or pericolonic fat stranding. Vascular/Lymphatic: Atherosclerotic nonaneurysmal abdominal aorta. No pathologically enlarged lymph nodes in the abdomen or pelvis. Reproductive: There is a 3.6 x 2.2 cm solid left adnexal mass (series 2/ image 71), stable since 11/13/2013 CT study, which could represent an asymmetrically prominent left ovary or less likely an exophytic left fundal uterine fibroid. No additional adnexal masses. Other: No pneumoperitoneum, ascites or focal fluid collection. Ventral right lower abdominal approach percutaneous catheter terminates in the anterior left upper pelvis. Musculoskeletal: No aggressive appearing focal osseous  lesions. Moderate degenerative changes in the visualized thoracolumbar spine. Prominent enlarged Schmorl's nodes at L3-4. Mild anasarca. IMPRESSION: 1. No acute abnormality. No evidence of bowel obstruction or acute bowel inflammation. Normal appendix. 2. Gallbladder is filled with nonspecific high density material, which could represent vicarious excretion of IV contrast if the patient  has received a recent dose of IV contrast, or less likely could represent sludge, stones or hemorrhage. No CT findings to suggest acute cholecystitis. Correlate with right upper quadrant abdominal sonography as clinically warranted. 3. Chronic solid left adnexal mass, stable since 2015, differential includes asymmetrically prominent left ovary or exophytic left fundal fibroid, correlate with pelvic sonography on a short term outpatient basis as clinically warranted. 4. Worsened degenerative disc disease at L3-4. 5. Additional findings include coronary atherosclerosis and small hiatal hernia. Electronically Signed   By: Ilona Sorrel M.D.   On: 07/15/2015 23:51   Dg Chest 2 View  07/09/2015  CLINICAL DATA:  Chest pain for 3 days EXAM: CHEST  2 VIEW COMPARISON:  05/12/2015 FINDINGS: The heart size and mediastinal contours are within normal limits. Both lungs are clear. The visualized skeletal structures are unremarkable. IMPRESSION: No active cardiopulmonary disease. Electronically Signed   By: Inez Catalina M.D.   On: 07/09/2015 21:45   Dg Abd 1 View  07/27/2015  CLINICAL DATA:  NG tube placement, ileus, abd pain, pt admitted 8 days ago for n/v EXAM: ABDOMEN - 1 VIEW COMPARISON:  07/24/2015 FINDINGS: Nasogastric tube tip projects in the right upper quadrant consistent with a in the distal stomach. Stomach appears decompressed. Moderate colonic stool burden evident in the right abdomen. No evidence of bowel obstruction. The IMPRESSION: Nasogastric tube well positioned. Tip projects in the distal stomach Electronically Signed   By: Lajean Manes M.D.   On: 07/27/2015 14:00   Dg Abd 1 View  07/24/2015  CLINICAL DATA:  Pt admitted since 4/15 with abdominal pain and nausea, worsening this morning; known h/o end-stage renal disease, on peritoneal dialysis. EXAM: ABDOMEN - 1 VIEW COMPARISON:  07/15/2015 FINDINGS: 3 supine views. Exam degraded by patient body habitus. Right hemidiaphragm elevation. Colonic stool burden  suggests constipation. Gas-filled small bowel loops in the left-sided abdomen measure up to 2.7 cm, upper normal. Dialysis catheter terminating over the central pelvis. Minimal gas in the rectum. IMPRESSION: Possible constipation. Decreased sensitivity and specificity exam due to technique related factors, as described above. Electronically Signed   By: Abigail Miyamoto M.D.   On: 07/24/2015 11:59   Ct Head Wo Contrast  07/21/2015  CLINICAL DATA:  Altered mental status. Noncommunicative and would not follow commands. Seizures earlier today. EXAM: CT HEAD WITHOUT CONTRAST TECHNIQUE: Contiguous axial images were obtained from the base of the skull through the vertex without intravenous contrast. COMPARISON:  07/19/2015 FINDINGS: Ventricles and sulci appear symmetrical. No ventricular dilatation. No mass effect or midline shift. No abnormal extra-axial fluid collections. Gray-white matter junctions are distinct. Basal cisterns are not effaced. No evidence of acute intracranial hemorrhage. No depressed skull fractures. Mild mucosal thickening in the paranasal sinuses. Mastoid air cells are not opacified. Vascular calcifications. Similar appearance to previous study. IMPRESSION: No acute intracranial abnormalities demonstrated. Electronically Signed   By: Lucienne Capers M.D.   On: 07/21/2015 22:43   Ct Head Wo Contrast  07/19/2015  CLINICAL DATA:  Left leg weakness. EXAM: CT HEAD WITHOUT CONTRAST TECHNIQUE: Contiguous axial images were obtained from the base of the skull through the vertex without intravenous contrast. COMPARISON:  05/23/2004 FINDINGS: Skull and  Sinuses:Negative for fracture or destructive process. The visualized mastoids, middle ears, and imaged paranasal sinuses are clear. Visualized orbits: Interval bilateral cataract resection. Brain: No acute or remote infarction, hemorrhage, hydrocephalus, or mass lesion/mass effect. Normal cerebral volume. Intermittently poor visualization of gray-white  differentiation is attributed to scanner/technique. IMPRESSION: No acute finding or explanation for leg weakness. Electronically Signed   By: Monte Fantasia M.D.   On: 07/19/2015 16:55   Ct Lumbar Spine Wo Contrast  07/19/2015  CLINICAL DATA:  Squeezing pain in the left leg.  Left leg weakness. EXAM: CT LUMBAR SPINE WITHOUT CONTRAST TECHNIQUE: Multidetector CT imaging of the lumbar spine was performed without intravenous contrast administration. Multiplanar CT image reconstructions were also generated. COMPARISON:  06/03/2013, 10/26/2013, 07/15/2015 FINDINGS: The alignment is anatomic. The vertebral body heights are maintained. There is no acute fracture or static listhesis. The paravertebral soft tissues are normal. The intraspinal soft tissues are not fully imaged on this examination due to poor soft tissue contrast, but there is no gross soft tissue abnormality. There is degenerative disc disease at L3-4. There are Schmorl's nodes in the inferior endplate of L3 and superior endplate of L4. The endplate changes involving the L4 vertebral body have progressed compared with the prior exam. The remainder the disc spaces are relatively well preserved. At T12-L1 there is no significant disc protrusion, foraminal stenosis or central canal stenosis. At L1-2 there is no significant disc protrusion, foraminal stenosis or central canal stenosis. At L2-3 there is no significant disc protrusion, foraminal stenosis or central canal stenosis. At L3-4 there is a mild broad-based disc bulge. There is mild bilateral facet arthropathy. There is no significant foraminal or central canal stenosis. At L4-5 there is a broad-based disc bulge. There is severe right and mild left facet arthropathy. No significant foraminal or central canal stenosis. At L5-S1 there is moderate right and mild left facet arthropathy. No foraminal or central canal stenosis. There are degenerative changes of bilateral sacroiliac joints. IMPRESSION: 1. No  acute injury of the lumbar spine. 2. Degenerative disc disease L3-4 with a mild broad-based disc bulge and mild bilateral facet arthropathy. 3. At L4-5 there is severe right and mild left facet arthropathy. 4. At L5-S1 there is moderate right and mild left facet arthropathy. Electronically Signed   By: Kathreen Devoid   On: 07/19/2015 17:03   Ct Femur Left W Contrast  07/25/2015  CLINICAL DATA:  Cellulitis of the left thigh.  Left thigh pain. EXAM: CT OF THE LOWER LEFT EXTREMITY WITHOUT CONTRAST TECHNIQUE: Multidetector CT imaging of the left femur was performed according to the standard protocol. COMPARISON:  None. FINDINGS: There is no acute fracture or dislocation. There is no lytic or sclerotic osseous lesion. There is no periosteal reaction or bone destruction. There is lateral patellar tilting. The hip joint space is maintained. The muscles are normal. There is no muscle atrophy. There is no intramuscular fluid collection or hematoma. There is peripheral vascular atherosclerotic disease. There is soft tissue edema diffusely throughout the left thigh subcutaneous fat most severe along the lateral aspect consistent with cellulitis. There is no soft tissue emphysema. There is no focal fluid collection. IMPRESSION: 1. Left thigh cellulitis. No focal fluid collection to suggest abscess. Electronically Signed   By: Kathreen Devoid   On: 07/25/2015 16:40    Assessment:   NATIA FAHMY is a 50 y.o. female admitted 4/16 with nv and d and abd pain. She has a hx of end-stage renal disease on peritoneal dialysis,  hypertension, DM, gastroparesis, coronary artery disease, congestive heart failure.  On admission she had peritoneal fluid drawn with Wbc 193 and 91% PMNS,  Cx negative. Started on levofloxacin and vanco and fu PD fluid with improved WBC. S/p 10 days treatment and abx stopped She has developed redness on bilateral flank over last few days - started clindamycin No fevers, chills, wbc nml Levo 4/15 -  present Vanco 4/16- present   Recommendations 1.Her Cath associated peritonitis seems to have been well treated- monitor off abx for recurrence  2. Her flank redness is unlikely bilateral cellulitis but probably edema and skin changes associated with this and some fungal superinfection.   I would stop clindamycin and monitor off abx. Apply nystatin powder to intertriginous areas and monitor for worsening. ? If she can have further fluid removal with PD.  Would also benefit from mobilization, oob to chair etc Thank you very much for allowing me to participate in the care of this patient. Please call with questions.   Cheral Marker. Ola Spurr, MD

## 2015-07-29 LAB — COMPREHENSIVE METABOLIC PANEL
ALBUMIN: 1.8 g/dL — AB (ref 3.5–5.0)
ALT: 12 U/L — ABNORMAL LOW (ref 14–54)
ANION GAP: 17 — AB (ref 5–15)
AST: 18 U/L (ref 15–41)
Alkaline Phosphatase: 77 U/L (ref 38–126)
BILIRUBIN TOTAL: 0.5 mg/dL (ref 0.3–1.2)
BUN: 61 mg/dL — ABNORMAL HIGH (ref 6–20)
CO2: 27 mmol/L (ref 22–32)
Calcium: 5.6 mg/dL — CL (ref 8.9–10.3)
Chloride: 94 mmol/L — ABNORMAL LOW (ref 101–111)
Creatinine, Ser: 11.95 mg/dL — ABNORMAL HIGH (ref 0.44–1.00)
GFR calc Af Amer: 4 mL/min — ABNORMAL LOW (ref >60)
GFR, EST NON AFRICAN AMERICAN: 3 mL/min — AB (ref >60)
GLUCOSE: 118 mg/dL — AB (ref 65–99)
POTASSIUM: 3.9 mmol/L (ref 3.5–5.1)
Sodium: 138 mmol/L (ref 135–145)
TOTAL PROTEIN: 4.9 g/dL — AB (ref 6.5–8.1)

## 2015-07-29 LAB — CBC WITH DIFFERENTIAL/PLATELET
BASOS ABS: 0.1 10*3/uL (ref 0–0.1)
Basophils Relative: 1 %
EOS ABS: 0.4 10*3/uL (ref 0–0.7)
Eosinophils Relative: 5 %
HEMATOCRIT: 23.1 % — AB (ref 35.0–47.0)
Hemoglobin: 7.6 g/dL — ABNORMAL LOW (ref 12.0–16.0)
LYMPHS ABS: 1.3 10*3/uL (ref 1.0–3.6)
Lymphocytes Relative: 15 %
MCH: 28.4 pg (ref 26.0–34.0)
MCHC: 33.1 g/dL (ref 32.0–36.0)
MCV: 85.9 fL (ref 80.0–100.0)
MONO ABS: 0.5 10*3/uL (ref 0.2–0.9)
MONOS PCT: 6 %
NEUTROS ABS: 6.1 10*3/uL (ref 1.4–6.5)
Neutrophils Relative %: 73 %
PLATELETS: 283 10*3/uL (ref 150–440)
RBC: 2.69 MIL/uL — ABNORMAL LOW (ref 3.80–5.20)
RDW: 13.6 % (ref 11.5–14.5)
WBC: 8.4 10*3/uL (ref 3.6–11.0)

## 2015-07-29 LAB — GLUCOSE, CAPILLARY
GLUCOSE-CAPILLARY: 103 mg/dL — AB (ref 65–99)
GLUCOSE-CAPILLARY: 145 mg/dL — AB (ref 65–99)
GLUCOSE-CAPILLARY: 83 mg/dL (ref 65–99)
Glucose-Capillary: 96 mg/dL (ref 65–99)

## 2015-07-29 MED ORDER — DELFLEX-LC/2.5% DEXTROSE 394 MOSM/L IP SOLN
INTRAPERITONEAL | Status: DC
  Administered 2015-07-29: 10 L via INTRAPERITONEAL
  Filled 2015-07-29 (×2): qty 3000

## 2015-07-29 MED ORDER — SODIUM CHLORIDE 0.9 % IV SOLN
1.0000 g | Freq: Once | INTRAVENOUS | Status: AC
  Administered 2015-07-29: 1 g via INTRAVENOUS
  Filled 2015-07-29: qty 10

## 2015-07-29 MED ORDER — DELFLEX-LC/2.5% DEXTROSE 394 MOSM/L IP SOLN: INTRAPERITONEAL | Status: DC

## 2015-07-29 NOTE — Progress Notes (Addendum)
Approximately around 2110 pt complaining of nausea. Dose of Zofran given. Shortly after administration of Zofran, pt had one episode of vomiting. Emesis was dark red, with chunks of jello. Per pt she had red popsicle, and jello for dinner. 5 mg of Compazine given. Pt stated that she felt a little better after the dose of compazine. Pt currently sleeping, no complaints at this time. Will continue to monitor.   Mayra Neer M

## 2015-07-29 NOTE — Progress Notes (Addendum)
Rn notified of critical calcium level of 5.6. MD paged. Awaiting call back.   Update: MD Pyreddy made aware, MD to review chart and add orders.   Mayra Neer M

## 2015-07-29 NOTE — Consult Note (Signed)
  Psychiatry: Came by to try to follow up with the patient. Found her once again sound asleep. Chart reviewed. Did not wake her up. No change to medication plan for now.

## 2015-07-29 NOTE — Progress Notes (Signed)
Patient ID: Kimberly Montgomery, female   DOB: 04/06/1965, 50 y.o.   MRN: [id number] [facility] [place] [facility]   PATIENT NAME: Kimberly Montgomery    MR#:  [id number]  DATE OF BIRTH:  [birth year]  SUBJECTIVE:   Patient had a rough night last night. She had nausea and vomiting. She did tolerate her clear liquid diet this morning. She denies abdominal pain.  REVIEW OF SYSTEMS:   Review of Systems  Constitutional: Negative for fever, chills and weight loss.  HENT: Negative for ear discharge, ear pain and nosebleeds.   Eyes: Negative for blurred vision, pain and discharge.  Respiratory: Negative for sputum production, shortness of breath, wheezing and stridor.   Cardiovascular: Negative for chest pain, palpitations, orthopnea and PND.  Gastrointestinal: Positive for nausea and constipation. Negative for vomiting, abdominal pain, diarrhea, blood in stool and melena.  Genitourinary: Negative for urgency and frequency.  Musculoskeletal: Negative for back pain and joint pain.  Skin: Positive for rash.  Neurological: Negative for sensory change, speech change, focal weakness, seizures and weakness.  Psychiatric/Behavioral: Negative for depression and hallucinations. The patient is not nervous/anxious.    Tolerating Diet:yes  Tolerating PT: rec rehab  DRUG ALLERGIES:   Allergies  Allergen Reactions  . Cephalosporins Anaphylaxis  . Penicillins Anaphylaxis and Other (See Comments)    Has patient had a PCN reaction causing immediate rash, facial/tongue/throat swelling, SOB or lightheadedness with hypotension: Yes Has patient had a PCN reaction causing severe rash involving mucus membranes or skin necrosis: No Has patient had a PCN reaction that required hospitalization No Has patient had a PCN reaction occurring within the last 10 years: No If all of the above answers are "NO", then may proceed with Cephalosporin use.  [name] Phenergan [Promethazine Hcl] Nausea And Vomiting     VITALS:  Blood pressure 96/52, pulse 66, temperature 97.6 F (36.4 C), temperature source Oral, resp. rate 14, height 5' 9.02" (1.753 m), weight 122.834 kg (270 lb 12.8 oz), last menstrual period 03/16/2015, SpO2 94 %.  PHYSICAL EXAMINATION:   Physical Exam  Constitutional: She is oriented to person, place, and time and well-developed, well-nourished, and in no distress. No distress.  HENT:  Head: Normocephalic.  Eyes: No scleral icterus.  Neck: Normal range of motion. Neck supple. No JVD present. No tracheal deviation present.  Cardiovascular: Normal rate, regular rhythm and normal heart sounds.  Exam reveals no gallop and no friction rub.   No murmur heard. Pulmonary/Chest: Effort normal. No respiratory distress. She has no wheezes. She has no rales. She exhibits no tenderness.  Abdominal: Soft. She exhibits no distension and no mass. There is no tenderness. There is no rebound and no guarding.  Musculoskeletal: Normal range of motion. She exhibits no edema.  Right prosetic leg   Neurological: She is alert and oriented to person, place, and time.  Skin: Skin is warm. No rash noted. There is erythema.  Erythema improved Rash near groin  Psychiatric: Mood, affect and judgment normal.      LABORATORY PANEL:  CBC  Recent Labs Lab 07/29/15 0424  WBC 8.4  HGB 7.6*  HCT 23.1*  PLT 283    Chemistries   Recent Labs Lab 07/29/15 0424  NA 138  K 3.9  CL 94*  CO2 27  GLUCOSE 118*  BUN 61*  CREATININE 11.95*  CALCIUM 5.6*  AST 18  ALT 12*  ALKPHOS 77  BILITOT 0.5   Cardiac Enzymes No results for input(s): TROPONINI in the  last 168 hours. RADIOLOGY:  Dg Abd 1 View  07/27/2015  CLINICAL DATA:  NG tube placement, ileus, abd pain, pt admitted 8 days ago for n/v EXAM: ABDOMEN - 1 VIEW COMPARISON:  07/24/2015 FINDINGS: Nasogastric tube tip projects in the right upper quadrant consistent with a in the distal stomach. Stomach appears decompressed. Moderate colonic  stool burden evident in the right abdomen. No evidence of bowel obstruction. The IMPRESSION: Nasogastric tube well positioned. Tip projects in the distal stomach Electronically Signed   By: Amie Portland M.D.   On: 07/27/2015 14:00   ASSESSMENT AND PLAN:  50 year old female patient with history of end-stage renal disease on peritoneal dialysis, hypertension, coronary artery disease, congestive heart failure presented to the emergency room with nausea and vomiting and abdominal discomfort.   1. Intractable nausea and vomiting secondary to severe gastroparesis with constipation. NG tube has now been discontinued Continue supportive care Continue erythromycin for gastroparesis Continue clear liquid diet Continue aggressive bowel regimen  2. Generalized tonic-clonic seizures new onset started on 07/21/2015 Continue Depakote 1000 mg twice a day and keppra 500 mg twice a day  EEG showed metabolic changes  Repeat CT head 4/ 21 negative   3. Acute peritonitis with nausea vomiting and diffuse abdominal pain. Peritoneal fluid with neutrophils greater than 70% initially, peritoneal fluid culture is negative Patient has improved. She was treated with Levofloxacin 500 mg QOD for 10 days and vancomycin completed course. As per ID recommendations, patient has improved and no need for further antibiotics unless patient has clinical signs of acute peritonitis. Patient does not need PD catheter to be removed at this time.   3.Left lower abdominal wall and left thigh cellulitis: This is now improved. As per ID recommendation stop all antibiotics. Most of the erythema is now due to dependent edema.  3. Elevated TSH with past medical history of goiter status post radiation Appreciate endocrinology consultation. No need for Synthroid at this point. Patient will need follow-up TFTs in 4 weeks after discharge. Patient may have subclinical hypothyroidism but does not need replacement unless TSH is greater  than 10.  4. End-stage renal disease on peritoneal dialysis Continue peritoneal dialysis as recommended by nephrology.  Patient declining to convert to hemodialysis  5. Hypocalcemia-repleted and repeat in a.m.   6. constipation: Continue aggressive bowel regimen. Next line patient may need to be disimpacted if no bowel movement over the next day.    7.  Congestive heart failure, chronic, diastolic Continue home medication aspirin 81 mg, Plavix 75 mg, Coreg when patient starts tolerating by mouth Monitor for symptoms and signs of fluid overload   8. Myoclonic jerks  due to gabapentin which was discontinued by Dr. Thad Ranger from neurology Wellbutrin was also discontinued as per Dr. Thad Ranger.  Psych consult recommended Abilify in its place.  9.. Anemia of chronic kidney disease: Hemoglobin is stable.  10.  Hyponatremia: Improved  12. Left toe: Will begin enzymatic debridement.  Educated regarding the importance of examining her foot daily and following up on all nonintact areas.  Dressing procedure/placement/frequency:Cleanse left great toe with NS and pat gently dry. Apply Santyl to wound bed. Cover with NS moist gauze. Wrap with kerlix and tape. Change daily      Management plans discussed with the patient and she is in agreement   Family would [facility] to go  to [facility] when bed [facility] available  CODE STATUS:full   DVT Prophylaxis: heparin TOTAL TIME TAKING CARE OF THIS PATIENT: 28 minutes.  Note: This dictation was prepared with Dragon dictation along with smaller phrase technology. Any transcriptional errors that result from this process are unintentional.  Olaf Mesa M.D on 07/29/2015 at 11:41 AM  Between 7am to 6pm - Pager - [phone]  After 6pm go to [email or web address] - password [email or web address]  [facility]  Office  [phone]  CC: Primary care physician; Jarome Matin, MD

## 2015-07-29 NOTE — Progress Notes (Signed)
Subjective:     PO intake appears to be improving. NG tube is out. Patient is able to take clears. Tolerated breakfast and lunch + peripheral edema, especially in thigh region, now with reddish rash on the left thigh and lower abdomen PD went OK UF of 1987 cc last night   Objective:  Vital signs in last 24 hours:  Temp:  [97.6 F (36.4 C)-97.9 F (36.6 C)] 97.6 F (36.4 C) (04/29 1112) Pulse Rate:  [63-67] 66 (04/29 1112) Resp:  [14-20] 14 (04/29 1112) BP: (96-115)/(40-88) 96/52 mmHg (04/29 1112) SpO2:  [93 %-100 %] 94 % (04/29 1112) Weight:  [122.834 kg (270 lb 12.8 oz)] 122.834 kg (270 lb 12.8 oz) (04/29 8182)  Weight change: -3.81 kg (-8 lb 6.4 oz) Filed Weights   07/27/15 0418 07/28/15 0626 07/29/15 9937  Weight: 128.096 kg (282 lb 6.4 oz) 126.644 kg (279 lb 3.2 oz) 122.834 kg (270 lb 12.8 oz)    Intake/Output:    Intake/Output Summary (Last 24 hours) at 07/29/15 1540 Last data filed at 07/29/15 1509  Gross per 24 hour  Intake   2080 ml  Output   1988 ml  Net     92 ml     Physical Exam: General: NAD  HEENT Anicteric, moist oral mucous membranes,    Neck supple  Pulm/lungs Normal effort, clear to auscultation bilaterally  CVS/Heart No rub or gallop  Abdomen:  Soft, nontender  Extremities: + dependent peripheral edema, right BKA  Neurologic:  Alert, oriented  Skin: Reddish rash over left thigh, left lower abdomen  Access: PD catheter, exit site nontender        Basic Metabolic Panel:   Recent Labs Lab 07/25/15 0454 07/26/15 0357 07/27/15 0403 07/28/15 0420 07/29/15 0424  NA 129* 127* 131* 134* 138  K 4.4 4.4 4.2 3.7 3.9  CL 89* 87* 89* 90* 94*  CO2 26 23 26 27 27   GLUCOSE 91 118* 167* 99 118*  BUN 61* 60* 65* 61* 61*  CREATININE 10.58* 10.85* 11.58* 11.82* 11.95*  CALCIUM 6.1* 6.2* 5.8* 5.4* 5.6*  PHOS  --  8.6*  --   --   --      CBC:  Recent Labs Lab 07/25/15 0454 07/26/15 0357 07/27/15 0403 07/28/15 0420 07/29/15 0424  WBC  10.8 10.6 8.2 7.7 8.4  NEUTROABS  --   --   --   --  6.1  HGB 7.8* 7.3* 7.0* 7.3* 7.6*  HCT 23.2* 21.7* 20.7* 21.9* 23.1*  MCV 83.2 83.2 83.6 85.0 85.9  PLT 296 280 252 262 283      Microbiology:  Recent Results (from the past 720 hour(s))  Body fluid culture     Status: None   Collection Time: 07/15/15 10:23 PM  Result Value Ref Range Status   Specimen Description PERITONEAL  Final   Special Requests Normal  Final   Gram Stain RARE WBC SEEN NO ORGANISMS SEEN   Final   Culture No growth aerobically or anaerobically.  Final   Report Status 07/19/2015 FINAL  Final  Culture, blood (routine x 2)     Status: None   Collection Time: 07/16/15  3:58 AM  Result Value Ref Range Status   Specimen Description BLOOD RIGHT WRIST  Final   Special Requests   Final    BOTTLES DRAWN AEROBIC AND ANAEROBIC 12CCAERO,6CCANA   Culture NO GROWTH 5 DAYS  Final   Report Status 07/21/2015 FINAL  Final  Culture, blood (routine x 2)  Status: None   Collection Time: 07/16/15  3:58 AM  Result Value Ref Range Status   Specimen Description BLOOD RIGHT FOREARM  Final   Special Requests   Final    BOTTLES DRAWN AEROBIC AND ANAEROBIC 12CCAERO,2CCANA   Culture NO GROWTH 5 DAYS  Final   Report Status 07/21/2015 FINAL  Final  MRSA PCR Screening     Status: None   Collection Time: 07/21/15 10:45 PM  Result Value Ref Range Status   MRSA by PCR NEGATIVE NEGATIVE Final    Comment:        The GeneXpert MRSA Assay (FDA approved for NASAL specimens only), is one component of a comprehensive MRSA colonization surveillance program. It is not intended to diagnose MRSA infection nor to guide or monitor treatment for MRSA infections.     Coagulation Studies: No results for input(s): LABPROT, INR in the last 72 hours.  Urinalysis: No results for input(s): COLORURINE, LABSPEC, PHURINE, GLUCOSEU, HGBUR, BILIRUBINUR, KETONESUR, PROTEINUR, UROBILINOGEN, NITRITE, LEUKOCYTESUR in the last 72 hours.  Invalid  input(s): APPERANCEUR    Imaging: No results found.   Medications:     . antiseptic oral rinse  7 mL Mouth Rinse BID  . ARIPiprazole  2 mg Oral Daily  . aspirin EC  81 mg Oral Daily  . bisacodyl  10 mg Rectal Daily  . calcium acetate  2,001 mg Oral TID WC  . calcium carbonate  1 tablet Oral TID WC  . carvedilol  6.25 mg Oral BID WC  . clindamycin (CLEOCIN) IV  600 mg Intravenous Q8H  . clopidogrel  75 mg Oral Daily  . collagenase   Topical Daily  . dialysis solution 2.5% low-MG/low-CA   Intraperitoneal Q24H  . docusate sodium  100 mg Oral BID  . erythromycin  250 mg Intravenous Q8H  . ezetimibe  10 mg Oral QHS  . gentamicin cream  1 application Topical Daily  . heparin  5,000 Units Subcutaneous 3 times per day  . insulin aspart  0-5 Units Subcutaneous QHS  . insulin aspart  0-9 Units Subcutaneous TID WC  . isosorbide mononitrate  60 mg Oral Daily  . levETIRAcetam  500 mg Intravenous Q12H  . nystatin  1 Bottle Topical BID  . pantoprazole  40 mg Oral BID  . polyethylene glycol  17 g Oral Daily  . ranolazine  500 mg Oral BID  . sodium chloride flush  3 mL Intravenous Q12H  . tuberculin  5 Units Intradermal Once  . valproate sodium  1,000 mg Intravenous Q12H   sodium chloride, acetaminophen **OR** acetaminophen, albuterol, docusate sodium, heparin, lactulose, LORazepam, metoprolol, ondansetron **OR** ondansetron (ZOFRAN) IV, oxyCODONE-acetaminophen, polyethylene glycol, prochlorperazine, sodium chloride flush, zinc oxide  Assessment/ Plan:  50 y.o. white  female with past medical history of end-stage renal disease on peritoneal dialysis, asthma, gastroparesis, diabetes mellitus, congestive heart failure, hypertension, endometriosis, coronary atherosclerosis, medical management  [facility]/[address]/peritoneal dialysis  1. End-stage renal disease on peritoneal dialysis.  CCPD- 4 exchanges 8 hours with 2.2 litre fills. 2.5% dextrose,   - nightly PD - UF 1987 cc last  night   2. Acute peritonitis in PD patient. WBC cell count 690 with 73% neutrophils on admission.  - PD fluid culture is no growth. WBC count 21 on 4/25 - Empiric treatment with broad-spectrum antibiotics vancomycin (started 4/16- most recent dose 4/22) and Levaquin.   - VANC level 20 on 4/25  3. Anemia chronic kidney disease. - epo as outpatient. Holding now with neurologic  changes, seizures.   4. Secondary hyperparathyroidism. phos elevated at 11.7 on 4/20.- > 8.6  - calcium acetate with meals once able to tolerate diet  5. Nausea and vomiting - NG tube placed 4/27->4/28 - Changed erythromycin to iv - Patient had side effects to reglan in the past - ? underdialyzed as patient's outpatient prescription was 3 exchanges  6. Dependent generalized edema - UF with PD 1900 cc - use 2.5% bags     LOS: 13 Kimberly Montgomery 4/29/20173:40 PM

## 2015-07-29 NOTE — Care Management Note (Signed)
Case Management Note  Patient Details  Name: Kimberly Montgomery MRN: [id number] Date of Birth: 10-May-1965  Subjective/Objective:   Spoke to [employer] the [employer], this morning who confirmed that [name] is still on the [facility] transfer list.                  Action/Plan:   Expected Discharge Date:                  Expected Discharge Plan:     In-House Referral:     Discharge planning Services     Post Acute Care Choice:    Choice offered to:     DME Arranged:    DME Agency:     HH Arranged:    HH Agency:     Status of Service:     Medicare Important Message Given:  Yes Date Medicare IM Given:    Medicare IM give by:    Date Additional Medicare IM Given:    Additional Medicare Important Message give by:     If discussed at Long Length of Stay Meetings, dates discussed:    Additional Comments:  Payton Prinsen A, RN 07/29/2015, 8:40 AM

## 2015-07-30 LAB — CBC
HEMATOCRIT: 23.8 % — AB (ref 35.0–47.0)
Hemoglobin: 7.8 g/dL — ABNORMAL LOW (ref 12.0–16.0)
MCH: 28.1 pg (ref 26.0–34.0)
MCHC: 32.8 g/dL (ref 32.0–36.0)
MCV: 85.7 fL (ref 80.0–100.0)
PLATELETS: 264 10*3/uL (ref 150–440)
RBC: 2.77 MIL/uL — ABNORMAL LOW (ref 3.80–5.20)
RDW: 13.6 % (ref 11.5–14.5)
WBC: 8.9 10*3/uL (ref 3.6–11.0)

## 2015-07-30 LAB — BASIC METABOLIC PANEL
Anion gap: 17 — ABNORMAL HIGH (ref 5–15)
BUN: 56 mg/dL — AB (ref 6–20)
CO2: 26 mmol/L (ref 22–32)
CREATININE: 11.42 mg/dL — AB (ref 0.44–1.00)
Calcium: 5.5 mg/dL — CL (ref 8.9–10.3)
Chloride: 92 mmol/L — ABNORMAL LOW (ref 101–111)
GFR calc Af Amer: 4 mL/min — ABNORMAL LOW (ref >60)
GFR, EST NON AFRICAN AMERICAN: 3 mL/min — AB (ref >60)
GLUCOSE: 105 mg/dL — AB (ref 65–99)
Potassium: 3.5 mmol/L (ref 3.5–5.1)
SODIUM: 135 mmol/L (ref 135–145)

## 2015-07-30 LAB — GLUCOSE, CAPILLARY
GLUCOSE-CAPILLARY: 92 mg/dL (ref 65–99)
GLUCOSE-CAPILLARY: 68 mg/dL (ref 65–99)
GLUCOSE-CAPILLARY: 141 mg/dL — AB (ref 65–99)
GLUCOSE-CAPILLARY: 85 mg/dL (ref 65–99)
Glucose-Capillary: 108 mg/dL — ABNORMAL HIGH (ref 65–99)
Glucose-Capillary: 197 mg/dL — ABNORMAL HIGH (ref 65–99)

## 2015-07-30 MED ORDER — ONDANSETRON HCL 4 MG/2ML IJ SOLN
4.0000 mg | Freq: Once | INTRAMUSCULAR | Status: AC
  Administered 2015-07-30: 4 mg via INTRAVENOUS
  Filled 2015-07-30: qty 2

## 2015-07-30 MED ORDER — METOCLOPRAMIDE HCL 5 MG/ML IJ SOLN
5.0000 mg | Freq: Three times a day (TID) | INTRAMUSCULAR | Status: DC
  Administered 2015-07-30 – 2015-08-02 (×9): 5 mg via INTRAVENOUS
  Filled 2015-07-30 (×9): qty 2

## 2015-07-30 MED ORDER — CALCIUM GLUCONATE 10 % IV SOLN
1.0000 g | Freq: Once | INTRAVENOUS | Status: AC
  Administered 2015-07-30: 1 g via INTRAVENOUS
  Filled 2015-07-30: qty 10

## 2015-07-30 NOTE — Progress Notes (Signed)
PT Cancellation Note  Patient Details Name: OSSIE SOUCY MRN: [id number] DOB: 03-15-1966   Cancelled Treatment:    Reason Eval/Treat Not Completed: Medical issues which prohibited therapy. New order for 30 July 2015 received; however, after chart review, it is determined that physical therapy is contraindicated at this time due to Calcium levels. Will check back when medically appropriate.   Neita Carp, PT, DPT 07/30/2015, 12:00 PM

## 2015-07-30 NOTE — Progress Notes (Signed)
A&O, mostly sleeping. Complains of nausea, medicated for same. Refused PO meds due to nausea. Peritoneal dialysis performed during the night with no complications. No complaints of pain. On RA.

## 2015-07-30 NOTE — Consult Note (Signed)
  Psychiatry: Follow-up for this 50 year old woman with a history of depression and possible bipolar disorder. Currently in the hospital with multiple severe medical problems including peritonitis and new seizures. Antidepressant was changed because Wellbutrin increased her risk of seizures. On reevaluation today I found the patient drowsy but arousable and then able to engage in conversation. She said that she was feeling somewhat frustrated but not depressed and not angry. She was not having any hallucinations and did not appear to be psychotic. She was tolerating medicine well but was getting more frustrated with continuing to be sick.  No suicidal ideation no evidence of psychosis. Affect and mood appropriate.  Continue Abilify 2 mg for now as treatment for her depression and bipolar disorder. No change to medicine I will follow-up as needed.

## 2015-07-30 NOTE — Progress Notes (Signed)
Subjective:     PO intake was improving until yesterday. Patient reports GI upset that started last night + peripheral edema, especially in thigh region, now with reddish rash on the left thigh and lower abdomen- improving PD went OK UF of 1095 cc last night   Objective:  Vital signs in last 24 hours:  Temp:  [97.4 F (36.3 C)-97.8 F (36.6 C)] 97.4 F (36.3 C) (04/30 1140) Pulse Rate:  [61-69] 68 (04/30 1140) Resp:  [18-20] 18 (04/30 1140) BP: (108-123)/(45-63) 113/45 mmHg (04/30 1140) SpO2:  [90 %-99 %] 99 % (04/30 1140) Weight:  [123.152 kg (271 lb 8 oz)] 123.152 kg (271 lb 8 oz) (04/30 0500)  Weight change: 0.318 kg (11.2 oz) Filed Weights   07/28/15 0626 07/29/15 0608 07/30/15 0500  Weight: 126.644 kg (279 lb 3.2 oz) 122.834 kg (270 lb 12.8 oz) 123.152 kg (271 lb 8 oz)    Intake/Output:    Intake/Output Summary (Last 24 hours) at 07/30/15 1354 Last data filed at 07/30/15 1221  Gross per 24 hour  Intake   2590 ml  Output   1420 ml  Net   1170 ml     Physical Exam: General: NAD  HEENT Anicteric, moist oral mucous membranes,    Neck supple  Pulm/lungs Normal effort, clear to auscultation bilaterally  CVS/Heart No rub or gallop  Abdomen:  Soft, nontender  Extremities: + dependent peripheral edema, right BKA  Neurologic:  Alert, oriented  Skin: Reddish rash over left thigh, left lower abdomen- improving  Access: PD catheter, exit site nontender        Basic Metabolic Panel:   Recent Labs Lab 07/26/15 0357 07/27/15 0403 07/28/15 0420 07/29/15 0424 07/30/15 0438  NA 127* 131* 134* 138 135  K 4.4 4.2 3.7 3.9 3.5  CL 87* 89* 90* 94* 92*  CO2 23 26 27 27 26   GLUCOSE 118* 167* 99 118* 105*  BUN 60* 65* 61* 61* 56*  CREATININE 10.85* 11.58* 11.82* 11.95* 11.42*  CALCIUM 6.2* 5.8* 5.4* 5.6* 5.5*  PHOS 8.6*  --   --   --   --      CBC:  Recent Labs Lab 07/26/15 0357 07/27/15 0403 07/28/15 0420 07/29/15 0424 07/30/15 0438  WBC 10.6 8.2 7.7  8.4 8.9  NEUTROABS  --   --   --  6.1  --   HGB 7.3* 7.0* 7.3* 7.6* 7.8*  HCT 21.7* 20.7* 21.9* 23.1* 23.8*  MCV 83.2 83.6 85.0 85.9 85.7  PLT 280 252 262 283 264      Microbiology:  Recent Results (from the past 720 hour(s))  Body fluid culture     Status: None   Collection Time: 07/15/15 10:23 PM  Result Value Ref Range Status   Specimen Description PERITONEAL  Final   Special Requests Normal  Final   Gram Stain RARE WBC SEEN NO ORGANISMS SEEN   Final   Culture No growth aerobically or anaerobically.  Final   Report Status 07/19/2015 FINAL  Final  Culture, blood (routine x 2)     Status: None   Collection Time: 07/16/15  3:58 AM  Result Value Ref Range Status   Specimen Description BLOOD RIGHT WRIST  Final   Special Requests   Final    BOTTLES DRAWN AEROBIC AND ANAEROBIC 12CCAERO,6CCANA   Culture NO GROWTH 5 DAYS  Final   Report Status 07/21/2015 FINAL  Final  Culture, blood (routine x 2)     Status: None   Collection Time:  07/16/15  3:58 AM  Result Value Ref Range Status   Specimen Description BLOOD RIGHT FOREARM  Final   Special Requests   Final    BOTTLES DRAWN AEROBIC AND ANAEROBIC 12CCAERO,2CCANA   Culture NO GROWTH 5 DAYS  Final   Report Status 07/21/2015 FINAL  Final  MRSA PCR Screening     Status: None   Collection Time: 07/21/15 10:45 PM  Result Value Ref Range Status   MRSA by PCR NEGATIVE NEGATIVE Final    Comment:        The GeneXpert MRSA Assay (FDA approved for NASAL specimens only), is one component of a comprehensive MRSA colonization surveillance program. It is not intended to diagnose MRSA infection nor to guide or monitor treatment for MRSA infections.     Coagulation Studies: No results for input(s): LABPROT, INR in the last 72 hours.  Urinalysis: No results for input(s): COLORURINE, LABSPEC, PHURINE, GLUCOSEU, HGBUR, BILIRUBINUR, KETONESUR, PROTEINUR, UROBILINOGEN, NITRITE, LEUKOCYTESUR in the last 72 hours.  Invalid input(s):  APPERANCEUR    Imaging: No results found.   Medications:     . antiseptic oral rinse  7 mL Mouth Rinse BID  . ARIPiprazole  2 mg Oral Daily  . aspirin EC  81 mg Oral Daily  . bisacodyl  10 mg Rectal Daily  . calcium acetate  2,001 mg Oral TID WC  . calcium carbonate  1 tablet Oral TID WC  . carvedilol  6.25 mg Oral BID WC  . clindamycin (CLEOCIN) IV  600 mg Intravenous Q8H  . clopidogrel  75 mg Oral Daily  . collagenase   Topical Daily  . dialysis solution 2.5% low-MG/low-CA   Intraperitoneal Q24H  . docusate sodium  100 mg Oral BID  . erythromycin  250 mg Intravenous Q8H  . ezetimibe  10 mg Oral QHS  . gentamicin cream  1 application Topical Daily  . heparin  5,000 Units Subcutaneous 3 times per day  . insulin aspart  0-5 Units Subcutaneous QHS  . insulin aspart  0-9 Units Subcutaneous TID WC  . isosorbide mononitrate  60 mg Oral Daily  . levETIRAcetam  500 mg Intravenous Q12H  . metoCLOPramide (REGLAN) injection  5 mg Intravenous TID WC  . nystatin  1 Bottle Topical BID  . pantoprazole  40 mg Oral BID  . polyethylene glycol  17 g Oral Daily  . ranolazine  500 mg Oral BID  . sodium chloride flush  3 mL Intravenous Q12H  . tuberculin  5 Units Intradermal Once  . valproate sodium  1,000 mg Intravenous Q12H   sodium chloride, acetaminophen **OR** acetaminophen, albuterol, docusate sodium, heparin, lactulose, LORazepam, metoprolol, ondansetron **OR** ondansetron (ZOFRAN) IV, oxyCODONE-acetaminophen, polyethylene glycol, prochlorperazine, sodium chloride flush, zinc oxide  Assessment/ Plan:  50 y.o. white  female with past medical history of end-stage renal disease on peritoneal dialysis, asthma, gastroparesis, diabetes mellitus, congestive heart failure, hypertension, endometriosis, coronary atherosclerosis, medical management  [facility]/[address]/peritoneal dialysis  1. End-stage renal disease on peritoneal dialysis.  CCPD- 4 exchanges 8 hours with 2.2 litre  fills. 2.5% dextrose,   - nightly PD - UF 1095 cc last night   2. Acute peritonitis in PD patient. WBC cell count 690 with 73% neutrophils on admission.  - PD fluid culture is no growth. WBC count 21 on 4/25 - Empiric treatment with broad-spectrum antibiotics vancomycin (started 4/16- most recent dose 4/22) and Levaquin.   - VANC level 20 on 4/25  3. Anemia chronic kidney disease. - epo as  outpatient. Holding now with neurologic changes, seizures.   4. Secondary hyperparathyroidism. phos elevated at 11.7 on 4/20.- > 8.6  - calcium acetate with meals once able to tolerate diet  5. Nausea and vomiting - NG tube placed 4/27->4/28 - Changed erythromycin to iv - Patient had side effects to reglan in the past - ? underdialyzed as patient's outpatient prescription was 3 exchanges  6. Dependent generalized edema - UF with PD 1000 cc with 2.5 % - use 1.5% bags tonight     LOS: 14 Kimberly Montgomery 4/30/20171:54 PM

## 2015-07-30 NOTE — Progress Notes (Signed)
Notified Dr. Tobi Bastos of Ca level of 5.5

## 2015-07-30 NOTE — Progress Notes (Signed)
Patient having more nausea and vomiting. Zofran given 2 hours ago. Patient does not want to take compazine due to side effects. Dr. Juliene Pina notified and gave order for reglan TID and to give another dose of zofran with the reglan. Dr. Juliene Pina also wants patient changed back to clear liquid diet. Trudee Kuster

## 2015-07-30 NOTE — Progress Notes (Signed)
Patient ID: Kimberly Montgomery, female   DOB: 1966-01-08, 50 y.o.   MRN: [id number] [place] [facility]   PATIENT NAME: Kimberly Montgomery    MR#:  [id number]  DATE OF BIRTH:  1965-08-08  SUBJECTIVE:   Patient did well all day. She has some nausea last night and one episode of vomiting. She had a small bowel movement yesterday as well. Data her breakfast well this morning. She wants to try a soft diet.  REVIEW OF SYSTEMS:   Review of Systems  Constitutional: Negative for fever, chills and weight loss.  HENT: Negative for ear discharge, ear pain and nosebleeds.   Eyes: Negative for blurred vision, pain and discharge.  Respiratory: Negative for sputum production, shortness of breath, wheezing and stridor.   Cardiovascular: Negative for chest pain, palpitations, orthopnea and PND.  Gastrointestinal: Negative for nausea, vomiting, abdominal pain, diarrhea, blood in stool and melena.  Genitourinary: Negative for urgency and frequency.  Musculoskeletal: Negative for back pain and joint pain.  Skin: Positive for rash.  Neurological: Negative for sensory change, speech change, focal weakness, seizures and weakness.  Psychiatric/Behavioral: Negative for depression and hallucinations. The patient is not nervous/anxious.    Tolerating Diet:yes  Tolerating PT: rec rehab  DRUG ALLERGIES:   Allergies  Allergen Reactions  . Cephalosporins Anaphylaxis  . Penicillins Anaphylaxis and Other (See Comments)    Has patient had a PCN reaction causing immediate rash, facial/tongue/throat swelling, SOB or lightheadedness with hypotension: Yes Has patient had a PCN reaction causing severe rash involving mucus membranes or skin necrosis: No Has patient had a PCN reaction that required hospitalization No Has patient had a PCN reaction occurring within the last 10 years: No If all of the above answers are "NO", then may proceed with Cephalosporin use.  [name] Phenergan [Promethazine Hcl]  Nausea And Vomiting    VITALS:  Blood pressure 110/63, pulse 62, temperature 97.7 F (36.5 C), temperature source Oral, resp. rate 20, height 5' 9.02" (1.753 m), weight 123.152 kg (271 lb 8 oz), last menstrual period 03/16/2015, SpO2 92 %.  PHYSICAL EXAMINATION:   Physical Exam  Constitutional: She is oriented to person, place, and time and well-developed, well-nourished, and in no distress. No distress.  HENT:  Head: Normocephalic.  Eyes: No scleral icterus.  Neck: Normal range of motion. Neck supple. No JVD present. No tracheal deviation present.  Cardiovascular: Normal rate, regular rhythm and normal heart sounds.  Exam reveals no gallop and no friction rub.   No murmur heard. Pulmonary/Chest: Effort normal. No respiratory distress. She has no wheezes. She has no rales. She exhibits no tenderness.  Abdominal: Soft. She exhibits no distension and no mass. There is no tenderness. There is no rebound and no guarding.  Musculoskeletal: Normal range of motion. She exhibits no edema.  Right prosetic leg   Neurological: She is alert and oriented to person, place, and time.  Skin: Skin is warm. No rash noted. There is erythema.  Erythema improved Rash near groin  Psychiatric: Mood, affect and judgment normal.      LABORATORY PANEL:  CBC  Recent Labs Lab 07/30/15 0438  WBC 8.9  HGB 7.8*  HCT 23.8*  PLT 264    Chemistries   Recent Labs Lab 07/29/15 0424 07/30/15 0438  NA 138 135  K 3.9 3.5  CL 94* 92*  CO2 27 26  GLUCOSE 118* 105*  BUN 61* 56*  CREATININE 11.95* 11.42*  CALCIUM 5.6* 5.5*  AST 18  --  ALT 12*  --   ALKPHOS 77  --   BILITOT 0.5  --    Cardiac Enzymes No results for input(s): TROPONINI in the last 168 hours. RADIOLOGY:  No results found. ASSESSMENT AND PLAN:  50 year old female patient with history of end-stage renal disease on peritoneal dialysis, hypertension, coronary artery disease, congestive heart failure presented to the emergency room  with nausea and vomiting and abdominal discomfort.   1. Intractable nausea and vomiting secondary to severe gastroparesis with constipation. NG tube has now been discontinued Continue supportive care with Zofran and Compazine only if needed. Continue erythromycin for gastroparesis Try soft diet.  Continue bowel regimen  2. Generalized tonic-clonic seizures new onset started on 07/21/2015 Continue Depakote 1000 mg twice a day and keppra 500 mg twice a day  EEG showed metabolic changes  Repeat CT head 4/ 21 negative   3. Acute peritonitis with nausea vomiting and diffuse abdominal pain. Peritoneal fluid with neutrophils greater than 70% initially, peritoneal fluid culture is negative Patient has improved. She was treated with Levofloxacin 500 mg QOD for 10 days and vancomycin completed course. As per ID recommendations, patient has improved and no need for further antibiotics unless patient has clinical signs of acute peritonitis. Patient does not need PD catheter to be removed at this time.   3.Left lower abdominal wall and left thigh cellulitis: This is now improved. As per ID recommendation stop all antibiotics. Most of the erythema is now due to dependent edema. Continue nystatin for fungal rash.   4. Elevated TSH with past medical history of goiter status post radiation Appreciate endocrinology consultation. No need for Synthroid at this point. Patient will need follow-up TFTs in 4 weeks after discharge. Patient may have subclinical hypothyroidism but does not need replacement unless TSH is greater than 10.  5. End-stage renal disease on peritoneal dialysis Continue peritoneal dialysis as recommended by nephrology.  Patient declining to convert to hemodialysis  6. Hypocalcemia-repleted this morning and repeat in a.m.   7. constipation: Continue  bowel regimen.   7.  Congestive heart failure, chronic, diastolic Continue home medication aspirin 81 mg, Plavix 75 mg, Coreg   Monitor for symptoms and signs of fluid overload   8. Myoclonic jerks  due to gabapentin which was discontinued by Dr. Thad Ranger from neurology Wellbutrin was also discontinued as per Dr. Thad Ranger.  Psych consult recommended Abilify in its place.  9.. Anemia of chronic kidney disease: Hemoglobin is stable.  10.  Hyponatremia: Improved  12. Left toe: continue enzymatic debridement.   Dressing procedure/placement/frequency:Cleanse left great toe with NS and pat gently dry. Apply Santyl to wound bed. Cover with NS moist gauze. Wrap with kerlix and tape. Change daily      Management plans discussed with the patient and she is in agreement   Family would like to go  to [facility] when bed is available  CODE STATUS:full   DVT Prophylaxis: heparin TOTAL TIME TAKING CARE OF THIS PATIENT: 25 minutes.    i will call daughter today    Note: This dictation was prepared with Dragon dictation along with smaller phrase technology. Any transcriptional errors that result from this process are unintentional.  Akilah Cureton M.D on 07/30/2015 at 10:30 AM  Between 7am to 6pm - Pager - [phone]  After 6pm go to [email or web address] - password [email or web address] [facility]  [facility]  Office  [phone]  CC: Primary care physician; Jarome Matin, MD

## 2015-07-31 ENCOUNTER — Inpatient Hospital Stay: Payer: Medicare HMO

## 2015-07-31 LAB — GLUCOSE, CAPILLARY
GLUCOSE-CAPILLARY: 93 mg/dL (ref 65–99)
GLUCOSE-CAPILLARY: 87 mg/dL (ref 65–99)
GLUCOSE-CAPILLARY: 71 mg/dL (ref 65–99)
Glucose-Capillary: 96 mg/dL (ref 65–99)
Glucose-Capillary: 84 mg/dL (ref 65–99)

## 2015-07-31 LAB — BASIC METABOLIC PANEL
ANION GAP: 14 (ref 5–15)
BUN: 52 mg/dL — ABNORMAL HIGH (ref 6–20)
CALCIUM: 5.8 mg/dL — AB (ref 8.9–10.3)
CO2: 28 mmol/L (ref 22–32)
CREATININE: 11.33 mg/dL — AB (ref 0.44–1.00)
Chloride: 91 mmol/L — ABNORMAL LOW (ref 101–111)
GFR calc non Af Amer: 3 mL/min — ABNORMAL LOW (ref >60)
GFR, EST AFRICAN AMERICAN: 4 mL/min — AB (ref >60)
Glucose, Bld: 91 mg/dL (ref 65–99)
Potassium: 3.6 mmol/L (ref 3.5–5.1)
SODIUM: 133 mmol/L — AB (ref 135–145)

## 2015-07-31 LAB — CBC
HEMATOCRIT: 22.4 % — AB (ref 35.0–47.0)
HEMOGLOBIN: 7.4 g/dL — AB (ref 12.0–16.0)
MCH: 27.9 pg (ref 26.0–34.0)
MCHC: 33.1 g/dL (ref 32.0–36.0)
MCV: 84.4 fL (ref 80.0–100.0)
Platelets: 270 10*3/uL (ref 150–440)
RBC: 2.65 MIL/uL — ABNORMAL LOW (ref 3.80–5.20)
RDW: 13.6 % (ref 11.5–14.5)
WBC: 9 10*3/uL (ref 3.6–11.0)

## 2015-07-31 LAB — PHOSPHORUS: PHOSPHORUS: 8.1 mg/dL — AB (ref 2.5–4.6)

## 2015-07-31 MED ORDER — LEVETIRACETAM 500 MG PO TABS
500.0000 mg | ORAL_TABLET | Freq: Every morning | ORAL | Status: DC
  Administered 2015-08-01: 500 mg via ORAL
  Filled 2015-07-31 (×3): qty 1

## 2015-07-31 MED ORDER — LEVETIRACETAM 750 MG PO TABS
750.0000 mg | ORAL_TABLET | Freq: Every day | ORAL | Status: DC
  Administered 2015-07-31 – 2015-08-01 (×2): 750 mg via ORAL
  Filled 2015-07-31 (×2): qty 1

## 2015-07-31 NOTE — Progress Notes (Signed)
Peritoneal dialysis start

## 2015-07-31 NOTE — Progress Notes (Signed)
Subjective: Patient reports that she continues to have quite a bit of jerking in the morning.    Objective: Current vital signs: BP 147/80 mmHg  Pulse 81  Temp(Src) 98.2 F (36.8 C) (Oral)  Resp 19  Ht 5' 9.02" (1.753 m)  Wt 125.556 kg (276 lb 12.8 oz)  BMI 40.86 kg/m2  SpO2 94%  LMP 03/16/2015 Vital signs in last 24 hours: Temp:  [97.6 F (36.4 C)-98.2 F (36.8 C)] 98.2 F (36.8 C) (05/01 1147) Pulse Rate:  [65-81] 81 (05/01 1147) Resp:  [16-19] 19 (05/01 1147) BP: (84-147)/(49-80) 147/80 mmHg (05/01 1147) SpO2:  [94 %-99 %] 94 % (05/01 1147) Weight:  [125.556 kg (276 lb 12.8 oz)] 125.556 kg (276 lb 12.8 oz) (05/01 0442)  Intake/Output from previous day: 04/30 0701 - 05/01 0700 In: 2005 [P.O.:1120; IV Piggyback:885] Out: 1220 [Emesis/NG output:125] Intake/Output this shift: Total I/O In: 760 [P.O.:760] Out: 911 [Other:911] Nutritional status: Diet - low sodium heart healthy Diet - low sodium heart healthy Diet - low sodium heart healthy Diet clear liquid Room service appropriate?: Yes; Fluid consistency:: Thin  Neurologic Exam: No myoclonic jerks noted  Lab Results: Basic Metabolic Panel:  Recent Labs Lab 07/26/15 0357 07/27/15 0403 07/28/15 0420 07/29/15 0424 07/30/15 0438 07/31/15 0451  NA 127* 131* 134* 138 135 133*  K 4.4 4.2 3.7 3.9 3.5 3.6  CL 87* 89* 90* 94* 92* 91*  CO2 23 26 27 27 26 28   GLUCOSE 118* 167* 99 118* 105* 91  BUN 60* 65* 61* 61* 56* 52*  CREATININE 10.85* 11.58* 11.82* 11.95* 11.42* 11.33*  CALCIUM 6.2* 5.8* 5.4* 5.6* 5.5* 5.8*  PHOS 8.6*  --   --   --   --  8.1*    Liver Function Tests:  Recent Labs Lab 07/25/15 0454 07/26/15 1449 07/28/15 0420 07/29/15 0424  AST 17 16 21 18   ALT 13* 11* 11* 12*  ALKPHOS 83 84 76 77  BILITOT 0.6 0.5 0.6 0.5  PROT 5.1* 4.9* 4.8* 4.9*  ALBUMIN 1.8* 1.7* 1.7* 1.8*   No results for input(s): LIPASE, AMYLASE in the last 168 hours. No results for input(s): AMMONIA in the last 168  hours.  CBC:  Recent Labs Lab 07/27/15 0403 07/28/15 0420 07/29/15 0424 07/30/15 0438 07/31/15 0451  WBC 8.2 7.7 8.4 8.9 9.0  NEUTROABS  --   --  6.1  --   --   HGB 7.0* 7.3* 7.6* 7.8* 7.4*  HCT 20.7* 21.9* 23.1* 23.8* 22.4*  MCV 83.6 85.0 85.9 85.7 84.4  PLT 252 262 283 264 270    Cardiac Enzymes: No results for input(s): CKTOTAL, CKMB, CKMBINDEX, TROPONINI in the last 168 hours.  Lipid Panel: No results for input(s): CHOL, TRIG, HDL, CHOLHDL, VLDL, LDLCALC in the last 168 hours.  CBG:  Recent Labs Lab 07/30/15 1759 07/30/15 2038 07/31/15 0444 07/31/15 0735 07/31/15 1144  GLUCAP 141* 85 96 93 87    Microbiology: Results for orders placed or performed during the hospital encounter of 07/15/15  Body fluid culture     Status: None   Collection Time: 07/15/15 10:23 PM  Result Value Ref Range Status   Specimen Description PERITONEAL  Final   Special Requests Normal  Final   Gram Stain RARE WBC SEEN NO ORGANISMS SEEN   Final   Culture No growth aerobically or anaerobically.  Final   Report Status 07/19/2015 FINAL  Final  Culture, blood (routine x 2)     Status: None   Collection Time:  07/16/15  3:58 AM  Result Value Ref Range Status   Specimen Description BLOOD RIGHT WRIST  Final   Special Requests   Final    BOTTLES DRAWN AEROBIC AND ANAEROBIC 12CCAERO,6CCANA   Culture NO GROWTH 5 DAYS  Final   Report Status 07/21/2015 FINAL  Final  Culture, blood (routine x 2)     Status: None   Collection Time: 07/16/15  3:58 AM  Result Value Ref Range Status   Specimen Description BLOOD RIGHT FOREARM  Final   Special Requests   Final    BOTTLES DRAWN AEROBIC AND ANAEROBIC 12CCAERO,2CCANA   Culture NO GROWTH 5 DAYS  Final   Report Status 07/21/2015 FINAL  Final  MRSA PCR Screening     Status: None   Collection Time: 07/21/15 10:45 PM  Result Value Ref Range Status   MRSA by PCR NEGATIVE NEGATIVE Final    Comment:        The GeneXpert MRSA Assay (FDA approved for  NASAL specimens only), is one component of a comprehensive MRSA colonization surveillance program. It is not intended to diagnose MRSA infection nor to guide or monitor treatment for MRSA infections.     Coagulation Studies: No results for input(s): LABPROT, INR in the last 72 hours.  Imaging: Dg Abd 1 View  07/31/2015  CLINICAL DATA:  Nausea and vomiting EXAM: ABDOMEN - 1 VIEW COMPARISON:  07/27/2015 FINDINGS: Scattered large and small bowel gas is noted. Fecal material is noted throughout colon similar to that seen on the prior exam. Degenerative changes of the lumbar spine are seen. Dialysis catheter is noted within the low pelvis. No other focal abnormality is seen. Nasogastric catheter is been removed. IMPRESSION: No significant change from the prior exam. Electronically Signed   By: Alcide Clever M.D.   On: 07/31/2015 12:07    Medications:  I have reviewed the patient's current medications. Scheduled: . antiseptic oral rinse  7 mL Mouth Rinse BID  . ARIPiprazole  2 mg Oral Daily  . aspirin EC  81 mg Oral Daily  . bisacodyl  10 mg Rectal Daily  . calcium acetate  2,001 mg Oral TID WC  . calcium carbonate  1 tablet Oral TID WC  . carvedilol  6.25 mg Oral BID WC  . clopidogrel  75 mg Oral Daily  . collagenase   Topical Daily  . docusate sodium  100 mg Oral BID  . erythromycin  250 mg Intravenous Q8H  . ezetimibe  10 mg Oral QHS  . gentamicin cream  1 application Topical Daily  . heparin  5,000 Units Subcutaneous 3 times per day  . insulin aspart  0-5 Units Subcutaneous QHS  . insulin aspart  0-9 Units Subcutaneous TID WC  . isosorbide mononitrate  60 mg Oral Daily  . levETIRAcetam  500 mg Intravenous Q12H  . metoCLOPramide (REGLAN) injection  5 mg Intravenous TID WC  . nystatin  1 Bottle Topical BID  . pantoprazole  40 mg Oral BID  . polyethylene glycol  17 g Oral Daily  . ranolazine  500 mg Oral BID  . sodium chloride flush  3 mL Intravenous Q12H  . tuberculin  5 Units  Intradermal Once  . valproate sodium  1,000 mg Intravenous Q12H    Assessment/Plan: Continued morning myoclonus.  Will increase night dose.    Recommendations: 1.  Change Keppra to  qAM,  qhs.  Will also change to po dosing from IV dosing.     LOS: 15 days  Thana Farr, MD Neurology [phone] 07/31/2015  1:13 PM

## 2015-07-31 NOTE — Progress Notes (Signed)
Pre-peritoneal dialysis 

## 2015-07-31 NOTE — Care Management (Signed)
TC to [facility] transfer Center. Patient remains on wait list and still has no bed available. Continues to have n/v. Receiving IV Reglan, Compazine and Zofran. Following progression.

## 2015-07-31 NOTE — Progress Notes (Signed)
A&O. Very weak. Appears pale. CO nausea, compazine given early in shift. SLept well. IV antibiotics given.

## 2015-07-31 NOTE — Progress Notes (Signed)
Post peritoneal dialysis 

## 2015-07-31 NOTE — Progress Notes (Signed)
Subjective:   Patient still having nausea and vomiting. Had emesis back at her bedside. Patient still pending transfer to [facility].   Objective:  Vital signs in last 24 hours:  Temp:  [97.6 F (36.4 C)-98.2 F (36.8 C)] 98.2 F (36.8 C) (05/01 1147) Pulse Rate:  [65-81] 78 (05/01 1731) Resp:  [16-19] 19 (05/01 1147) BP: (84-147)/(40-80) 123/58 mmHg (05/01 1731) SpO2:  [94 %-99 %] 94 % (05/01 1147) Weight:  [125.556 kg (276 lb 12.8 oz)] 125.556 kg (276 lb 12.8 oz) (05/01 0442)  Weight change: 2.404 kg (5 lb 4.8 oz) Filed Weights   07/29/15 0608 07/30/15 0500 07/31/15 0442  Weight: 122.834 kg (270 lb 12.8 oz) 123.152 kg (271 lb 8 oz) 125.556 kg (276 lb 12.8 oz)    Intake/Output:    Intake/Output Summary (Last 24 hours) at 07/31/15 1747 Last data filed at 07/31/15 1341  Gross per 24 hour  Intake   2095 ml  Output    911 ml  Net   1184 ml     Physical Exam: General: NAD  HEENT Anicteric, moist oral mucous membranes,    Neck supple  Pulm/lungs Normal effort, clear to auscultation bilaterally  CVS/Heart S1S2 no rubs  Abdomen:  Soft, nontender, BS  Extremities: + dependent peripheral edema, right BKA  Neurologic:  Alert, oriented  Skin: Reddish rash over left thigh, left lower abdomen- improving  Access: PD catheter       Basic Metabolic Panel:   Recent Labs Lab 07/26/15 0357 07/27/15 0403 07/28/15 0420 07/29/15 0424 07/30/15 0438 07/31/15 0451  NA 127* 131* 134* 138 135 133*  K 4.4 4.2 3.7 3.9 3.5 3.6  CL 87* 89* 90* 94* 92* 91*  CO2 23 26 27 27 26 28   GLUCOSE 118* 167* 99 118* 105* 91  BUN 60* 65* 61* 61* 56* 52*  CREATININE 10.85* 11.58* 11.82* 11.95* 11.42* 11.33*  CALCIUM 6.2* 5.8* 5.4* 5.6* 5.5* 5.8*  PHOS 8.6*  --   --   --   --  8.1*     CBC:  Recent Labs Lab 07/27/15 0403 07/28/15 0420 07/29/15 0424 07/30/15 0438 07/31/15 0451  WBC 8.2 7.7 8.4 8.9 9.0  NEUTROABS  --   --  6.1  --   --   HGB 7.0* 7.3* 7.6* 7.8* 7.4*  HCT 20.7* 21.9*  23.1* 23.8* 22.4*  MCV 83.6 85.0 85.9 85.7 84.4  PLT 252 262 283 264 270      Microbiology:  Recent Results (from the past 720 hour(s))  Body fluid culture     Status: None   Collection Time: 07/15/15 10:23 PM  Result Value Ref Range Status   Specimen Description PERITONEAL  Final   Special Requests Normal  Final   Gram Stain RARE WBC SEEN NO ORGANISMS SEEN   Final   Culture No growth aerobically or anaerobically.  Final   Report Status 07/19/2015 FINAL  Final  Culture, blood (routine x 2)     Status: None   Collection Time: 07/16/15  3:58 AM  Result Value Ref Range Status   Specimen Description BLOOD RIGHT WRIST  Final   Special Requests   Final    BOTTLES DRAWN AEROBIC AND ANAEROBIC 12CCAERO,6CCANA   Culture NO GROWTH 5 DAYS  Final   Report Status 07/21/2015 FINAL  Final  Culture, blood (routine x 2)     Status: None   Collection Time: 07/16/15  3:58 AM  Result Value Ref Range Status   Specimen Description BLOOD RIGHT  FOREARM  Final   Special Requests   Final    BOTTLES DRAWN AEROBIC AND ANAEROBIC 12CCAERO,2CCANA   Culture NO GROWTH 5 DAYS  Final   Report Status 07/21/2015 FINAL  Final  MRSA PCR Screening     Status: None   Collection Time: 07/21/15 10:45 PM  Result Value Ref Range Status   MRSA by PCR NEGATIVE NEGATIVE Final    Comment:        The GeneXpert MRSA Assay (FDA approved for NASAL specimens only), is one component of a comprehensive MRSA colonization surveillance program. It is not intended to diagnose MRSA infection nor to guide or monitor treatment for MRSA infections.     Coagulation Studies: No results for input(s): LABPROT, INR in the last 72 hours.  Urinalysis: No results for input(s): COLORURINE, LABSPEC, PHURINE, GLUCOSEU, HGBUR, BILIRUBINUR, KETONESUR, PROTEINUR, UROBILINOGEN, NITRITE, LEUKOCYTESUR in the last 72 hours.  Invalid input(s): APPERANCEUR    Imaging: Dg Abd 1 View  07/31/2015  CLINICAL DATA:  Nausea and vomiting EXAM:  ABDOMEN - 1 VIEW COMPARISON:  07/27/2015 FINDINGS: Scattered large and small bowel gas is noted. Fecal material is noted throughout colon similar to that seen on the prior exam. Degenerative changes of the lumbar spine are seen. Dialysis catheter is noted within the low pelvis. No other focal abnormality is seen. Nasogastric catheter is been removed. IMPRESSION: No significant change from the prior exam. Electronically Signed   By: Alcide Clever M.D.   On: 07/31/2015 12:07     Medications:     . antiseptic oral rinse  7 mL Mouth Rinse BID  . ARIPiprazole  2 mg Oral Daily  . aspirin EC  81 mg Oral Daily  . bisacodyl  10 mg Rectal Daily  . calcium acetate  2,001 mg Oral TID WC  . calcium carbonate  1 tablet Oral TID WC  . carvedilol  6.25 mg Oral BID WC  . clopidogrel  75 mg Oral Daily  . collagenase   Topical Daily  . docusate sodium  100 mg Oral BID  . erythromycin  250 mg Intravenous Q8H  . ezetimibe  10 mg Oral QHS  . gentamicin cream  1 application Topical Daily  . heparin  5,000 Units Subcutaneous 3 times per day  . insulin aspart  0-5 Units Subcutaneous QHS  . insulin aspart  0-9 Units Subcutaneous TID WC  . isosorbide mononitrate  60 mg Oral Daily  . [START ON 08/01/2015] levETIRAcetam  500 mg Oral q morning - 10a  . levETIRAcetam  750 mg Oral QHS  . metoCLOPramide (REGLAN) injection  5 mg Intravenous TID WC  . nystatin  1 Bottle Topical BID  . pantoprazole  40 mg Oral BID  . polyethylene glycol  17 g Oral Daily  . ranolazine  500 mg Oral BID  . sodium chloride flush  3 mL Intravenous Q12H  . tuberculin  5 Units Intradermal Once  . valproate sodium  1,000 mg Intravenous Q12H   sodium chloride, acetaminophen **OR** acetaminophen, albuterol, docusate sodium, heparin, lactulose, LORazepam, metoprolol, ondansetron **OR** ondansetron (ZOFRAN) IV, oxyCODONE-acetaminophen, prochlorperazine, sodium chloride flush, zinc oxide  Assessment/ Plan:  50 y.o. white  female with past medical  history of end-stage renal disease on peritoneal dialysis, asthma, gastroparesis, diabetes mellitus, congestive heart failure, hypertension, endometriosis, coronary atherosclerosis, medical management  [facility]/[address]/peritoneal dialysis  1. End-stage renal disease on peritoneal dialysis.  CCPD- 4 exchanges 8 hours with 2.2 litre fills. 2.5% dextrose  - continue current PD  regimen.    2. Acute peritonitis in PD patient. WBC cell count 690 with 73% neutrophils on admission.  - PD fluid culture is no growth. WBC count 21 on 4/25 - Empiric treatment with broad-spectrum antibiotics vancomycin (started 4/16- most recent dose 4/22) and Levaquin.   - VANC level 20 on 4/25  3. Anemia chronic kidney disease.Has had seizures - Hgb currently 7.4, epo on hold due to seizures.  Consider blood transfusion for hgb of 7 or less.  4. Secondary hyperparathyroidism. phos elevated at 11.7 on 4/20.- > 8.6  - calcium acetate with meals once able to tolerate diet  5. Nausea and vomiting - NG tube placed 4/27->4/28 - Changed erythromycin to iv - Patient had side effects to reglan in the past - ? underdialyzed as patient's outpatient prescription was 3 exchanges  6. Dependent generalized edema - continue UF with PD as tolerated.      LOS: 15 Kimberly Montgomery 5/1/20175:47 PM

## 2015-07-31 NOTE — Progress Notes (Signed)
Patient ID: Montgomery Kimberly, female   DOB: June 01, 1965, 50 y.o.   MRN: [id number] [facility] [place] [facility]   PATIENT NAME: Kimberly Montgomery    MR#:  [id number]  DATE OF BIRTH:  [birth year]  SUBJECTIVE:   Patient had nausea and vomiting with clear liquid diet yesterday. Soft diet was not attempted. Patient is tolerating her clear liquid diet this morning. No abdominal pain. She had a bowel movement yesterday.  REVIEW OF SYSTEMS:   Review of Systems  Constitutional: Negative for fever, chills and weight loss.  HENT: Negative for ear discharge, ear pain and nosebleeds.   Eyes: Negative for blurred vision, pain and discharge.  Respiratory: Negative for sputum production, shortness of breath, wheezing and stridor.   Cardiovascular: Negative for chest pain, palpitations, orthopnea and PND.  Gastrointestinal: Negative for nausea, vomiting, abdominal pain, diarrhea, blood in stool and melena.  Genitourinary: Negative for urgency and frequency.  Musculoskeletal: Negative for back pain and joint pain.  Neurological: Negative for sensory change, speech change, focal weakness, seizures and weakness.  Psychiatric/Behavioral: Negative for depression and hallucinations. The patient is not nervous/anxious.    Tolerating Diet:yes  Tolerating PT: rec rehab  DRUG ALLERGIES:   Allergies  Allergen Reactions  . Cephalosporins Anaphylaxis  . Penicillins Anaphylaxis and Other (See Comments)    Has patient had a PCN reaction causing immediate rash, facial/tongue/throat swelling, SOB or lightheadedness with hypotension: Yes Has patient had a PCN reaction causing severe rash involving mucus membranes or skin necrosis: No Has patient had a PCN reaction that required hospitalization No Has patient had a PCN reaction occurring within the last 10 years: No If all of the above answers are "NO", then may proceed with Cephalosporin use.  [name] Phenergan [Promethazine Hcl] Nausea And Vomiting     VITALS:  Blood pressure 114/56, pulse 72, temperature 97.7 F (36.5 C), temperature source Oral, resp. rate 16, height 5' 9.02" (1.753 m), weight 125.556 kg (276 lb 12.8 oz), last menstrual period 03/16/2015, SpO2 95 %.  PHYSICAL EXAMINATION:   Physical Exam  Constitutional: She is oriented to person, place, and time and well-developed, well-nourished, and in no distress. No distress.  HENT:  Head: Normocephalic.  Eyes: No scleral icterus.  Neck: Normal range of motion. Neck supple. No JVD present. No tracheal deviation present.  Cardiovascular: Normal rate, regular rhythm and normal heart sounds.  Exam reveals no gallop and no friction rub.   No murmur heard. Pulmonary/Chest: Effort normal. No respiratory distress. She has no wheezes. She has no rales. She exhibits no tenderness.  Abdominal: Soft. She exhibits no distension and no mass. There is no tenderness. There is no rebound and no guarding.  Musculoskeletal: Normal range of motion. She exhibits no edema.  Right prosetic leg   Neurological: She is alert and oriented to person, place, and time.  Skin: Skin is warm. No rash noted. There is erythema.  Rash is improved.  Psychiatric: Mood, affect and judgment normal.      LABORATORY PANEL:  CBC  Recent Labs Lab 07/31/15 0451  WBC 9.0  HGB 7.4*  HCT 22.4*  PLT 270    Chemistries   Recent Labs Lab 07/29/15 0424  07/31/15 0451  NA 138  < > 133*  K 3.9  < > 3.6  CL 94*  < > 91*  CO2 27  < > 28  GLUCOSE 118*  < > 91  BUN 61*  < > 52*  CREATININE 11.95*  < >  11.33*  CALCIUM 5.6*  < > 5.8*  AST 18  --   --   ALT 12*  --   --   ALKPHOS 77  --   --   BILITOT 0.5  --   --   < > = values in this interval not displayed. Cardiac Enzymes No results for input(s): TROPONINI in the last 168 hours. RADIOLOGY:  No results found. ASSESSMENT AND PLAN:  50 year old female patient with history of end-stage renal disease on peritoneal dialysis, hypertension, coronary  artery disease, congestive heart failure presented to the emergency room with nausea and vomiting and abdominal discomfort.   1. Intractable nausea and vomiting secondary to severe gastroparesis with constipation. NG tube has now been discontinued Continue supportive care with Zofran and Compazine only if needed. Continue erythromycin and Reglan for gastroparesis Continue clear liquid diet today and if tolerated well then try soft diet tomorrow. Continue bowel regimen  2. Generalized tonic-clonic seizures new onset started on 07/21/2015 Continue Depakote 1000 mg twice a day and keppra 500 mg twice a day If tolerates diet, changing medications to by mouth tomorrow.  EEG showed metabolic changes  Repeat CT head 4/ 21 negative   3. Acute peritonitis with nausea vomiting and diffuse abdominal pain. Peritoneal fluid with neutrophils greater than 70% initially, peritoneal fluid culture is negative Patient has improved. She was treated with Levofloxacin 500 mg QOD for 10 days and vancomycin completed course. As per ID recommendations, patient has improved and no need for further antibiotics unless patient has clinical signs of acute peritonitis. Patient does not need PD catheter to be removed at this time.   3.Left lower abdominal wall and left thigh cellulitis: This is now improved. As per ID recommendation stop all antibiotics. Most of the erythema is now due to dependent edema. Continue nystatin for fungal rash.   4. Elevated TSH with past medical history of goiter status post radiation Appreciate endocrinology consultation. No need for Synthroid at this point. Patient will need follow-up TFTs in 4 weeks after discharge. Patient may have subclinical hypothyroidism but does not need replacement unless TSH is greater than 10.  5. End-stage renal disease on peritoneal dialysis Continue peritoneal dialysis as recommended by nephrology.  Patient declining to convert to hemodialysis  6.  Hypocalcemia-repleted again this morning and repeat in a.m.   7. constipation: Continue  bowel regimen.   7.  Congestive heart failure, chronic, diastolic Continue home medication aspirin 81 mg, Plavix 75 mg, Coreg  Monitor for symptoms and signs of fluid overload   8. Myoclonic jerks  due to gabapentin which was discontinued by Dr. Thad Ranger from neurology Wellbutrin was also discontinued as per Dr. Thad Ranger.  Psych consult recommended Abilify in its place.  9.. Anemia of chronic kidney disease: Hemoglobin is stable. Repeat for a.m. 10.  Hyponatremia: Improved  12. Left toe: continue enzymatic debridement.  Dressing procedure/placement/frequency:Cleanse left great toe with NS and pat gently dry. Apply Santyl to wound bed. Cover with NS moist gauze. Wrap with kerlix and tape. Change daily      Management plans discussed with the patient and she is in agreement   Family would like to go  to [facility] when bed is available  Due to ongoing gastroparesis patient remains in hospital. CODE STATUS:full   DVT Prophylaxis: heparin TOTAL TIME TAKING CARE OF THIS PATIENT: 25 minutes.       Note: This dictation was prepared with Dragon dictation along with smaller phrase technology. Any transcriptional errors that result  from this process are unintentional.  Dandrae Kustra M.D on 07/31/2015 at 10:56 AM  Between 7am to 6pm - Pager - [phone]  After 6pm go to [email or web address] - password [email or web address] [facility]  [place] [facility]  Office  [phone]  CC: Primary care physician; Jarome Matin, MD

## 2015-07-31 NOTE — Progress Notes (Signed)
PT Cancellation Note  Patient Details Name: Kimberly Montgomery MRN: [id number] DOB: 1966/01/02   Cancelled Treatment:    Reason Eval/Treat Not Completed: Medical issues which prohibited therapy. Continued to recommend no Physical therapy at this time secondary to decreased Ca+ levels, lowering threshold for seizure activity (5.8) this date. Spoke with Dr. Juliene Pina. Per chart review, RN have assisted pt to transfer to chair, however pt is not appropriate for further ambulation at this time. Will complete consult at this time. Pt also continues to be nauseous with all movement. Please re-consult if Calcium levels improve. Last PT noted on 07/20/15, will continue to recommend SNF placement as pt is not safe to dc home alone and will need extensive rehab when medical status improves. Per chart review, pt continues to refuse SNF placement secondary to dialysis concerns.   Cason Dabney 07/31/2015, 10:16 AM  Elizabeth Palau, PT, DPT [phone]

## 2015-07-31 NOTE — Care Management Important Message (Signed)
Important Message  Patient Details  Name: Kimberly Montgomery MRN: [id number] Date of Birth: 1965-10-07   Medicare Important Message Given:  Yes    Marily Memos, RN 07/31/2015, 12:41 PM

## 2015-07-31 NOTE — Progress Notes (Signed)
Peritoneal disconnect

## 2015-08-01 LAB — BASIC METABOLIC PANEL
Anion gap: 15 (ref 5–15)
BUN: 51 mg/dL — AB (ref 6–20)
CALCIUM: 5.8 mg/dL — AB (ref 8.9–10.3)
CO2: 28 mmol/L (ref 22–32)
Chloride: 91 mmol/L — ABNORMAL LOW (ref 101–111)
Creatinine, Ser: 11.22 mg/dL — ABNORMAL HIGH (ref 0.44–1.00)
GFR calc Af Amer: 4 mL/min — ABNORMAL LOW (ref >60)
GFR, EST NON AFRICAN AMERICAN: 3 mL/min — AB (ref >60)
GLUCOSE: 112 mg/dL — AB (ref 65–99)
Potassium: 3.6 mmol/L (ref 3.5–5.1)
Sodium: 134 mmol/L — ABNORMAL LOW (ref 135–145)

## 2015-08-01 LAB — GLUCOSE, CAPILLARY
GLUCOSE-CAPILLARY: 116 mg/dL — AB (ref 65–99)
GLUCOSE-CAPILLARY: 103 mg/dL — AB (ref 65–99)
Glucose-Capillary: 85 mg/dL (ref 65–99)
Glucose-Capillary: 118 mg/dL — ABNORMAL HIGH (ref 65–99)
Glucose-Capillary: 105 mg/dL — ABNORMAL HIGH (ref 65–99)
Glucose-Capillary: 143 mg/dL — ABNORMAL HIGH (ref 65–99)

## 2015-08-01 MED ORDER — MORPHINE SULFATE (PF) 2 MG/ML IV SOLN
2.0000 mg | Freq: Four times a day (QID) | INTRAVENOUS | Status: DC | PRN
  Administered 2015-08-01: 2 mg via INTRAVENOUS
  Filled 2015-08-01: qty 1

## 2015-08-01 NOTE — Progress Notes (Signed)
Patient has been alert and oriented. Had one episode of emesis early this AM and has been nauseous a couple of times since then. Reglan has seemed to help today. Given zofran twice. Patient prefers not to take compazine during the day because it makes her very sleepy. Patient has tolerated a soft diet well today. She ate a grilled cheese for lunch and one for dinner along with some mashed potatoes for dinner. No complaints of pain. Will continue to monitor.

## 2015-08-01 NOTE — Progress Notes (Signed)
Patient ID: Kimberly Montgomery, female   DOB: 07/12/1965, 50 y.o.   MRN: [id number] [employer]  [facility] [facility] [place] [facility]   PATIENT NAME: Kimberly Montgomery    MR#:  [id number]  DATE OF BIRTH:  03-Jun-1965  SUBJECTIVE:   Patient eating ice chips this morning. Patient had nausea. She does want to try soft diet. No other acute events.  REVIEW OF SYSTEMS:   Review of Systems  Constitutional: Negative for fever, chills and weight loss.  HENT: Negative for ear discharge, ear pain and nosebleeds.   Eyes: Negative for blurred vision, pain and discharge.  Respiratory: Negative for sputum production, shortness of breath, wheezing and stridor.   Cardiovascular: Negative for chest pain, palpitations, orthopnea and PND.  Gastrointestinal: Positive for nausea. Negative for vomiting, abdominal pain, diarrhea, blood in stool and melena.  Genitourinary: Negative for urgency and frequency.  Musculoskeletal: Negative for back pain and joint pain.  Neurological: Negative for sensory change, speech change, focal weakness, seizures and weakness.  Psychiatric/Behavioral: Negative for depression and hallucinations. The patient is not nervous/anxious.    Tolerating Diet:yes Clear liquid Tolerating PT: rec rehab  DRUG ALLERGIES:   Allergies  Allergen Reactions  . Cephalosporins Anaphylaxis  . Penicillins Anaphylaxis and Other (See Comments)    Has patient had a PCN reaction causing immediate rash, facial/tongue/throat swelling, SOB or lightheadedness with hypotension: Yes Has patient had a PCN reaction causing severe rash involving mucus membranes or skin necrosis: No Has patient had a PCN reaction that required hospitalization No Has patient had a PCN reaction occurring within the last 10 years: No If all of the above answers are "NO", then may proceed with Cephalosporin use.  [name] Phenergan [Promethazine Hcl] Nausea And Vomiting    VITALS:  Blood pressure 113/44, pulse 73, temperature 97.8 F  (36.6 C), temperature source Oral, resp. rate 20, height 5' 9.02" (1.753 m), weight 126.327 kg (278 lb 8 oz), last menstrual period 03/16/2015, SpO2 95 %.  PHYSICAL EXAMINATION:   Physical Exam  Constitutional: She is oriented to person, place, and time and well-developed, well-nourished, and in no distress. No distress.  HENT:  Head: Normocephalic.  Eyes: No scleral icterus.  Neck: Normal range of motion. Neck supple. No JVD present. No tracheal deviation present.  Cardiovascular: Normal rate, regular rhythm and normal heart sounds.  Exam reveals no gallop and no friction rub.   No murmur heard. Pulmonary/Chest: Effort normal. No respiratory distress. She has no wheezes. She has no rales. She exhibits no tenderness.  Abdominal: Soft. She exhibits no distension and no mass. There is no tenderness. There is no rebound and no guarding.  Musculoskeletal: Normal range of motion. She exhibits no edema.  Right prosetic leg   Neurological: She is alert and oriented to person, place, and time.  Skin: Skin is warm. No rash noted. No erythema.  Rash is improved.  Psychiatric: Mood, affect and judgment normal.      LABORATORY PANEL:  CBC  Recent Labs Lab 07/31/15 0451  WBC 9.0  HGB 7.4*  HCT 22.4*  PLT 270    Chemistries   Recent Labs Lab 07/29/15 0424  08/01/15 0410  NA 138  < > 134*  K 3.9  < > 3.6  CL 94*  < > 91*  CO2 27  < > 28  GLUCOSE 118*  < > 112*  BUN 61*  < > 51*  CREATININE 11.95*  < > 11.22*  CALCIUM 5.6*  < > 5.8*  AST  18  --   --   ALT 12*  --   --   ALKPHOS 77  --   --   BILITOT 0.5  --   --   < > = values in this interval not displayed. Cardiac Enzymes No results for input(s): TROPONINI in the last 168 hours. RADIOLOGY:  Dg Abd 1 View  07/31/2015  CLINICAL DATA:  Nausea and vomiting EXAM: ABDOMEN - 1 VIEW COMPARISON:  07/27/2015 FINDINGS: Scattered large and small bowel gas is noted. Fecal material is noted throughout colon similar to that seen on the  prior exam. Degenerative changes of the lumbar spine are seen. Dialysis catheter is noted within the low pelvis. No other focal abnormality is seen. Nasogastric catheter is been removed. IMPRESSION: No significant change from the prior exam. Electronically Signed   By: Alcide Clever M.D.   On: 07/31/2015 12:07   ASSESSMENT AND PLAN:  50 year old female patient with history of end-stage renal disease on peritoneal dialysis, hypertension, coronary artery disease, congestive heart failure presented to the emergency room with nausea and vomiting and abdominal discomfort.   1. Intractable nausea and vomiting secondary to severe gastroparesis with constipation. NG tube has now been discontinued Continue supportive care with Zofran and Compazine only if needed. Continue erythromycin and Reglan for gastroparesis Try soft diet. Continue bowel regimen  2. Generalized tonic-clonic seizures new onset started on 07/21/2015:  Continue Depakote 1000 mg twice a day and keppra 500 mg in a.m. and 750 mg p.m.  As of yesterday she was still having some myoclonic jerks.  EEG showed metabolic changes  Repeat CT head 4/ 21 negative   3. Acute peritonitis with nausea vomiting and diffuse abdominal pain. Peritoneal fluid with neutrophils greater than 70% initially, peritoneal fluid culture is negative Patient has improved. She was treated with Levofloxacin 500 mg QOD for 10 days and vancomycin completed course. As per ID recommendations, patient has improved and no need for further antibiotics unless patient has clinical signs of acute peritonitis. Patient does not need PD catheter to be removed at this time.   3.Left lower abdominal wall and left thigh cellulitis: This is resolved Clindamycin has been discontinued. Continue nystatin for fungal rash.   4. Elevated TSH with past medical history of goiter status post radiation Appreciate endocrinology consultation. No need for Synthroid at this point. Patient  will need follow-up TFTs in 4 weeks after discharge. Patient may have subclinical hypothyroidism but does not need replacement unless TSH is greater than 10.  5. End-stage renal disease on peritoneal dialysis Continue peritoneal dialysis as recommended by nephrology.  Patient declining to convert to hemodialysis  6. Hypocalcemia-replete  7. constipation: Continue bowel regimen.   7.  Congestive heart failure, chronic, diastolic Continue home medication aspirin 81 mg, Plavix 75 mg, Coreg  Monitor for symptoms and signs of fluid overload   8. Myoclonic jerks  due to gabapentin which was discontinued by Dr. Thad Ranger from neurology Wellbutrin was also discontinued as per Dr. Thad Ranger.  Psych consult recommended Abilify in its place.  9.. Anemia of chronic kidney disease: Hemoglobin is stable.  10.  Hyponatremia: Improved  12. Left toe: continue enzymatic debridement.  Dressing procedure/placement/frequency:Cleanse left great toe with NS and pat gently dry. Apply Santyl to wound bed. Cover with NS moist gauze. Wrap with kerlix and tape. Change daily      Management plans discussed with the patient and she is in agreement   Family would like to go  to [facility] when  bed [facility] available The patient if tolerates diet may be able to be discharged tomorrow to home with home health care.  Due to ongoing gastroparesis patient remains in hospital. CODE STATUS:full   DVT Prophylaxis: heparin TOTAL TIME TAKING CARE OF THIS PATIENT: 26 minutes.    Discussed with daughter by phone   Note: This dictation was prepared with Dragon dictation along with smaller phrase technology. Any transcriptional errors that result from this process are unintentional.  Jaydah Stahle M.D on 08/01/2015 at 10:46 AM  Between 7am to 6pm - Pager - [phone]  After 6pm go to [email or web address] - password [email or web address] [facility]  [place] [facility]  Office  [phone]  CC: Primary care physician; Jarome Matin,  MD

## 2015-08-01 NOTE — Progress Notes (Signed)
[facility] CLINIC INFECTIOUS DISEASE PROGRESS NOTE Date of Admission:  07/15/2015     ID: Kimberly Montgomery is a 50 y.o. female with peritonitis and flank redness  Principal Problem:   Peritonitis (HCC) Active Problems:   Bipolar I disorder (HCC)   Seizures (HCC)   ESRD (end stage renal disease) (HCC)   Ileus (HCC)   Subjective: No fevers, redness on flank improving. clinda stopped 4/30 and no worsening.  NGT out, advancing diet  ROS  Eleven systems are reviewed and negative except per hpi  Medications:  Antibiotics Given (last 72 hours)    Date/Time Action Medication Dose Rate   07/29/15 1920 Given   erythromycin 250 mg in sodium chloride 0.9 % 100 mL IVPB 250 mg 100 mL/hr   07/29/15 2133 Given   clindamycin (CLEOCIN) IVPB 600 mg 600 mg 100 mL/hr   07/30/15 0109 Given   erythromycin 250 mg in sodium chloride 0.9 % 100 mL IVPB 250 mg 100 mL/hr   07/30/15 1610 Given   clindamycin (CLEOCIN) IVPB 600 mg 600 mg 100 mL/hr   07/30/15 0919 Given   erythromycin 250 mg in sodium chloride 0.9 % 100 mL IVPB 250 mg 100 mL/hr   07/30/15 1446 Given   clindamycin (CLEOCIN) IVPB 600 mg 600 mg 100 mL/hr   07/30/15 1702 Given   erythromycin 250 mg in sodium chloride 0.9 % 100 mL IVPB 250 mg 100 mL/hr   07/30/15 2113 Given   clindamycin (CLEOCIN) IVPB 600 mg 600 mg 100 mL/hr   07/31/15 0138 Given   erythromycin 250 mg in sodium chloride 0.9 % 100 mL IVPB 250 mg 100 mL/hr   07/31/15 0502 Given   clindamycin (CLEOCIN) IVPB 600 mg 600 mg 100 mL/hr   07/31/15 1104 Given  [pharmacy delay]   erythromycin 250 mg in sodium chloride 0.9 % 100 mL IVPB 250 mg 100 mL/hr   07/31/15 1731 Given   erythromycin 250 mg in sodium chloride 0.9 % 100 mL IVPB 250 mg 100 mL/hr   08/01/15 0056 Given   erythromycin 250 mg in sodium chloride 0.9 % 100 mL IVPB 250 mg 100 mL/hr   08/01/15 1051 Given  [medication not available at scheduled time]   erythromycin 250 mg in sodium chloride 0.9 % 100 mL IVPB 250 mg 100  mL/hr     . antiseptic oral rinse  7 mL Mouth Rinse BID  . ARIPiprazole  2 mg Oral Daily  . aspirin EC  81 mg Oral Daily  . bisacodyl  10 mg Rectal Daily  . calcium acetate  2,001 mg Oral TID WC  . calcium carbonate  1 tablet Oral TID WC  . carvedilol  6.25 mg Oral BID WC  . clopidogrel  75 mg Oral Daily  . collagenase   Topical Daily  . docusate sodium  100 mg Oral BID  . erythromycin  250 mg Intravenous Q8H  . ezetimibe  10 mg Oral QHS  . gentamicin cream  1 application Topical Daily  . heparin  5,000 Units Subcutaneous 3 times per day  . insulin aspart  0-5 Units Subcutaneous QHS  . insulin aspart  0-9 Units Subcutaneous TID WC  . isosorbide mononitrate  60 mg Oral Daily  . levETIRAcetam  500 mg Oral q morning - 10a  . levETIRAcetam  750 mg Oral QHS  . metoCLOPramide (REGLAN) injection  5 mg Intravenous TID WC  . nystatin  1 Bottle Topical BID  . pantoprazole  40 mg Oral BID  .  polyethylene glycol  17 g Oral Daily  . ranolazine  500 mg Oral BID  . sodium chloride flush  3 mL Intravenous Q12H  . tuberculin  5 Units Intradermal Once  . valproate sodium  1,000 mg Intravenous Q12H    Objective: Vital signs in last 24 hours: Temp:  [97.6 F (36.4 C)-98.4 F (36.9 C)] 97.6 F (36.4 C) (05/02 1116) Pulse Rate:  [71-78] 75 (05/02 1116) Resp:  [18-20] 18 (05/02 1116) BP: (113-131)/(44-59) 123/57 mmHg (05/02 1116) SpO2:  [93 %-96 %] 96 % (05/02 1116) Weight:  [126.327 kg (278 lb 8 oz)] 126.327 kg (278 lb 8 oz) (05/02 0416) Constitutional: oriented to person, place, and time. Morbidly obese, chronically ill apperaing HENT: [place]/AT, PERRLA, no scleral icterus  Mouth/Throat: Oropharynx is clear and moist. No oropharyngeal exudate.  Cardiovascular: Normal rate, regular rhythm and normal heart sounds.  Pulmonary/Chest: Effort normal and breath sounds normal. No respiratory distress. has no wheezes.  Neck = supple, no nuchal rigidity  Abdominal: distended, nontender, PD cath in  palce Lymphadenopathy: no cervical adenopathy. No axillary adenopathy Neurological: alert and oriented to person, place, and time.  Skin: bil flanks much improved redness and erythema. Psychiatric: a normal mood and affect. behavior is normal.   Lab Results  Recent Labs  07/30/15 0438 07/31/15 0451 08/01/15 0410  WBC 8.9 9.0  --   HGB 7.8* 7.4*  --   HCT 23.8* 22.4*  --   NA 135 133* 134*  K 3.5 3.6 3.6  CL 92* 91* 91*  CO2 26 28 28   BUN 56* 52* 51*  CREATININE 11.42* 11.33* 11.22*    Microbiology: Results for orders placed or performed during the hospital encounter of 07/15/15  Body fluid culture     Status: None   Collection Time: 07/15/15 10:23 PM  Result Value Ref Range Status   Specimen Description PERITONEAL  Final   Special Requests Normal  Final   Gram Stain RARE WBC SEEN NO ORGANISMS SEEN   Final   Culture No growth aerobically or anaerobically.  Final   Report Status 07/19/2015 FINAL  Final  Culture, blood (routine x 2)     Status: None   Collection Time: 07/16/15  3:58 AM  Result Value Ref Range Status   Specimen Description BLOOD RIGHT WRIST  Final   Special Requests   Final    BOTTLES DRAWN AEROBIC AND ANAEROBIC 12CCAERO,6CCANA   Culture NO GROWTH 5 DAYS  Final   Report Status 07/21/2015 FINAL  Final  Culture, blood (routine x 2)     Status: None   Collection Time: 07/16/15  3:58 AM  Result Value Ref Range Status   Specimen Description BLOOD RIGHT FOREARM  Final   Special Requests   Final    BOTTLES DRAWN AEROBIC AND ANAEROBIC 12CCAERO,2CCANA   Culture NO GROWTH 5 DAYS  Final   Report Status 07/21/2015 FINAL  Final  MRSA PCR Screening     Status: None   Collection Time: 07/21/15 10:45 PM  Result Value Ref Range Status   MRSA by PCR NEGATIVE NEGATIVE Final    Comment:        The GeneXpert MRSA Assay (FDA approved for NASAL specimens only), is one component of a comprehensive MRSA colonization surveillance program. It is not intended to  diagnose MRSA infection nor to guide or monitor treatment for MRSA infections.     Studies/Results: Dg Abd 1 View  07/31/2015  CLINICAL DATA:  Nausea and vomiting EXAM: ABDOMEN - 1  VIEW COMPARISON:  07/27/2015 FINDINGS: Scattered large and small bowel gas is noted. Fecal material is noted throughout colon similar to that seen on the prior exam. Degenerative changes of the lumbar spine are seen. Dialysis catheter is noted within the low pelvis. No other focal abnormality is seen. Nasogastric catheter is been removed. IMPRESSION: No significant change from the prior exam. Electronically Signed   By: Alcide Clever M.D.   On: 07/31/2015 12:07    Assessment/Plan: Assessment:  YURIKO PORTALES is a 50 y.o. female admitted 4/16 with nv and d and abd pain. She has a hx of end-stage renal disease on peritoneal dialysis, hypertension, DM, gastroparesis, coronary artery disease, congestive heart failure. On admission she had peritoneal fluid drawn with Wbc 193 and 91% PMNS, Cx negative. Started on levofloxacin and vanco and fu PD fluid with improved WBC. S/p 10 days treatment and abx stopped She has developed redness on bilateral flank over last few days - started clindamycin No fevers, chills, wbc nml Levo 4/15 - present Vanco 4/16- present   Recommendations 1.Her Cath associated peritonitis seems to have been well treated- monitor off abx for recurrence  2. Her flank redness has improved with fluid removal and is unlikely bilateral cellulitis but probably edema and skin changes associated with fluid retention   Continue to monitor off of abx.    I will sign off now but please call with new questions  Marily Konczal P   08/01/2015, 3:22 PM

## 2015-08-01 NOTE — Care Management (Addendum)
TC to [facility] transfer center. No bed available. [facility] to call Cm back after they research when a bed may be available. Case discussed with Dr. Juliene Pina. She states another hospital is not an option due to family request for [facility] but she will speak with family further.  CM has ask [facility] to evaluate patient. Case discussed with Team Lead, Collie Siad. Will discuss further with the team.

## 2015-08-01 NOTE — Progress Notes (Signed)
Subjective: Patient reports some continued morning jerking. Her daughter is in the room today and reports that this was present even prior to admission at home.   Objective: Current vital signs: BP 113/44 mmHg  Pulse 73  Temp(Src) 97.8 F (36.6 C) (Oral)  Resp 20  Ht 5' 9.02" (1.753 m)  Wt 126.327 kg (278 lb 8 oz)  BMI 41.11 kg/m2  SpO2 95%  LMP 03/16/2015 Vital signs in last 24 hours: Temp:  [97.8 F (36.6 C)-98.4 F (36.9 C)] 97.8 F (36.6 C) (05/02 0416) Pulse Rate:  [71-81] 73 (05/02 0416) Resp:  [19-20] 20 (05/02 0416) BP: (113-147)/(40-80) 113/44 mmHg (05/02 0416) SpO2:  [93 %-95 %] 95 % (05/02 0416) Weight:  [126.327 kg (278 lb 8 oz)] 126.327 kg (278 lb 8 oz) (05/02 0416)  Intake/Output from previous day: 05/01 0701 - 05/02 0700 In: 1940 [P.O.:1520; IV Piggyback:420] Out: 1061 [Urine:150] Intake/Output this shift: Total I/O In: 760 [P.O.:760] Out: -  Nutritional status: Diet - low sodium heart healthy Diet - low sodium heart healthy Diet - low sodium heart healthy DIET SOFT Room service appropriate?: Yes; Fluid consistency:: Thin  Neurologic Exam: No myoclonic jerks noted  Lab Results: Basic Metabolic Panel:  Recent Labs Lab 07/26/15 0357  07/28/15 0420 07/29/15 0424 07/30/15 0438 07/31/15 0451 08/01/15 0410  NA 127*  < > 134* 138 135 133* 134*  K 4.4  < > 3.7 3.9 3.5 3.6 3.6  CL 87*  < > 90* 94* 92* 91* 91*  CO2 23  < > GLUCOSE 118*  < > 99 118* 105* 91 112*  BUN 60*  < > 61* 61* 56* 52* 51*  CREATININE 10.85*  < > 11.82* 11.95* 11.42* 11.33* 11.22*  CALCIUM 6.2*  < > 5.4* 5.6* 5.5* 5.8* 5.8*  PHOS 8.6*  --   --   --   --  8.1*  --   < > = values in this interval not displayed.  Liver Function Tests:  Recent Labs Lab 07/26/15 1449 07/28/15 0420 07/29/15 0424  AST ALT 11* 11* 12*  ALKPHOS 84 76 77  BILITOT 0.5 0.6 0.5  PROT 4.9* 4.8* 4.9*  ALBUMIN 1.7* 1.7* 1.8*   No results for input(s): LIPASE, AMYLASE in  the last 168 hours. No results for input(s): AMMONIA in the last 168 hours.  CBC:  Recent Labs Lab 07/27/15 0403 07/28/15 0420 07/29/15 0424 07/30/15 0438 07/31/15 0451  WBC 8.2 7.7 8.4 8.9 9.0  NEUTROABS  --   --  6.1  --   --   HGB 7.0* 7.3* 7.6* 7.8* 7.4*  HCT 20.7* 21.9* 23.1* 23.8* 22.4*  MCV 83.6 85.0 85.9 85.7 84.4  PLT 252 262 283 264 270    Cardiac Enzymes: No results for input(s): CKTOTAL, CKMB, CKMBINDEX, TROPONINI in the last 168 hours.  Lipid Panel: No results for input(s): CHOL, TRIG, HDL, CHOLHDL, VLDL, LDLCALC in the last 168 hours.  CBG:  Recent Labs Lab 07/31/15 1637 07/31/15 2054 07/31/15 2341 08/01/15 0419 08/01/15 0727  GLUCAP 84 71 85 118* 105*    Microbiology: Results for orders placed or performed during the hospital encounter of 07/15/15  Body fluid culture     Status: None   Collection Time: 07/15/15 10:23 PM  Result Value Ref Range Status   Specimen Description PERITONEAL  Final   Special Requests Normal  Final   Gram Stain RARE WBC SEEN NO ORGANISMS SEEN  Final   Culture No growth aerobically or anaerobically.  Final   Report Status 07/19/2015 FINAL  Final  Culture, blood (routine x 2)     Status: None   Collection Time: 07/16/15  3:58 AM  Result Value Ref Range Status   Specimen Description BLOOD RIGHT WRIST  Final   Special Requests   Final    BOTTLES DRAWN AEROBIC AND ANAEROBIC 12CCAERO,6CCANA   Culture NO GROWTH 5 DAYS  Final   Report Status 07/21/2015 FINAL  Final  Culture, blood (routine x 2)     Status: None   Collection Time: 07/16/15  3:58 AM  Result Value Ref Range Status   Specimen Description BLOOD RIGHT FOREARM  Final   Special Requests   Final    BOTTLES DRAWN AEROBIC AND ANAEROBIC 12CCAERO,2CCANA   Culture NO GROWTH 5 DAYS  Final   Report Status 07/21/2015 FINAL  Final  MRSA PCR Screening     Status: None   Collection Time: 07/21/15 10:45 PM  Result Value Ref Range Status   MRSA by PCR NEGATIVE NEGATIVE  Final    Comment:        The GeneXpert MRSA Assay (FDA approved for NASAL specimens only), is one component of a comprehensive MRSA colonization surveillance program. It is not intended to diagnose MRSA infection nor to guide or monitor treatment for MRSA infections.     Coagulation Studies: No results for input(s): LABPROT, INR in the last 72 hours.  Imaging: Dg Abd 1 View  07/31/2015  CLINICAL DATA:  Nausea and vomiting EXAM: ABDOMEN - 1 VIEW COMPARISON:  07/27/2015 FINDINGS: Scattered large and small bowel gas is noted. Fecal material is noted throughout colon similar to that seen on the prior exam. Degenerative changes of the lumbar spine are seen. Dialysis catheter is noted within the low pelvis. No other focal abnormality is seen. Nasogastric catheter is been removed. IMPRESSION: No significant change from the prior exam. Electronically Signed   By: Alcide Clever M.D.   On: 07/31/2015 12:07    Medications:  I have reviewed the patient's current medications. Scheduled: . antiseptic oral rinse  7 mL Mouth Rinse BID  . ARIPiprazole  2 mg Oral Daily  . aspirin EC  81 mg Oral Daily  . bisacodyl  10 mg Rectal Daily  . calcium acetate  2,001 mg Oral TID WC  . calcium carbonate  1 tablet Oral TID WC  . carvedilol  6.25 mg Oral BID WC  . clopidogrel  75 mg Oral Daily  . collagenase   Topical Daily  . docusate sodium  100 mg Oral BID  . erythromycin  250 mg Intravenous Q8H  . ezetimibe  10 mg Oral QHS  . gentamicin cream  1 application Topical Daily  . heparin  5,000 Units Subcutaneous 3 times per day  . insulin aspart  0-5 Units Subcutaneous QHS  . insulin aspart  0-9 Units Subcutaneous TID WC  . isosorbide mononitrate  60 mg Oral Daily  . levETIRAcetam  500 mg Oral q morning - 10a  . levETIRAcetam  750 mg Oral QHS  . metoCLOPramide (REGLAN) injection  5 mg Intravenous TID WC  . nystatin  1 Bottle Topical BID  . pantoprazole  40 mg Oral BID  . polyethylene glycol  17 g Oral  Daily  . ranolazine  500 mg Oral BID  . sodium chloride flush  3 mL Intravenous Q12H  . tuberculin  5 Units Intradermal Once  . valproate sodium  1,000  mg Intravenous Q12H    Assessment/Plan: Patient continues to have morning myoclonus but does not seem to have worsened with change to po Keppra.  Does not feel lethargic today.  There are also multiple metabolic issues that remain, most notably her calcium which may be affecting tings as well.   Recommendations: 1.  Continue Keppra at current dose    LOS: 16 days   Thana Farr, MD Neurology [phone] 08/01/2015  10:30 AM

## 2015-08-01 NOTE — Progress Notes (Signed)
Peritoneal Dialysis start

## 2015-08-01 NOTE — Progress Notes (Signed)
A&O. Complained of pain and nausea during the night. Medicated for both. Slept well. Peritoneal dialysis in progress. IV antibiotics given.

## 2015-08-01 NOTE — Care Management (Signed)
LTACH  can not take patients with PD.

## 2015-08-01 NOTE — Progress Notes (Signed)
Subjective:  atient did well with peritoneal dialysis yesterday. Ultrafiltration achieve was 2.1 L. He states that her nausea is slightly improved today.   Objective:  Vital signs in last 24 hours:  Temp:  [97.6 F (36.4 C)-98.4 F (36.9 C)] 97.6 F (36.4 C) (05/02 1116) Pulse Rate:  [71-78] 75 (05/02 1116) Resp:  [18-20] 18 (05/02 1116) BP: (113-131)/(44-59) 123/57 mmHg (05/02 1116) SpO2:  [93 %-96 %] 96 % (05/02 1116) Weight:  [126.327 kg (278 lb 8 oz)] 126.327 kg (278 lb 8 oz) (05/02 0416)  Weight change: 0.771 kg (1 lb 11.2 oz) Filed Weights   07/30/15 0500 07/31/15 0442 08/01/15 0416  Weight: 123.152 kg (271 lb 8 oz) 125.556 kg (276 lb 12.8 oz) 126.327 kg (278 lb 8 oz)    Intake/Output:    Intake/Output Summary (Last 24 hours) at 08/01/15 1528 Last data filed at 08/01/15 1325  Gross per 24 hour  Intake   1260 ml  Output   2374 ml  Net  -1114 ml     Physical Exam: General: NAD  HEENT Anicteric, moist oral mucous membranes,    Neck supple  Pulm/lungs Normal effort, clear to auscultation bilaterally  CVS/Heart S1S2 no rubs  Abdomen:  Soft, nontender, BS  Extremities: + dependent peripheral edema, right BKA  Neurologic:  Alert, oriented  Skin: Reddish rash over left thigh has decreased, left lower abdomen- improved  Access: PD catheter       Basic Metabolic Panel:   Recent Labs Lab 07/26/15 0357  07/28/15 0420 07/29/15 0424 07/30/15 0438 07/31/15 0451 08/01/15 0410  NA 127*  < > 134* 138 135 133* 134*  K 4.4  < > 3.7 3.9 3.5 3.6 3.6  CL 87*  < > 90* 94* 92* 91* 91*  CO2 23  < > GLUCOSE 118*  < > 99 118* 105* 91 112*  BUN 60*  < > 61* 61* 56* 52* 51*  CREATININE 10.85*  < > 11.82* 11.95* 11.42* 11.33* 11.22*  CALCIUM 6.2*  < > 5.4* 5.6* 5.5* 5.8* 5.8*  PHOS 8.6*  --   --   --   --  8.1*  --   < > = values in this interval not displayed.   CBC:  Recent Labs Lab 07/27/15 0403 07/28/15 0420 07/29/15 0424 07/30/15 0438  07/31/15 0451  WBC 8.2 7.7 8.4 8.9 9.0  NEUTROABS  --   --  6.1  --   --   HGB 7.0* 7.3* 7.6* 7.8* 7.4*  HCT 20.7* 21.9* 23.1* 23.8* 22.4*  MCV 83.6 85.0 85.9 85.7 84.4  PLT 252 262 283 264 270      Microbiology:  Recent Results (from the past 720 hour(s))  Body fluid culture     Status: None   Collection Time: 07/15/15 10:23 PM  Result Value Ref Range Status   Specimen Description PERITONEAL  Final   Special Requests Normal  Final   Gram Stain RARE WBC SEEN NO ORGANISMS SEEN   Final   Culture No growth aerobically or anaerobically.  Final   Report Status 07/19/2015 FINAL  Final  Culture, blood (routine x 2)     Status: None   Collection Time: 07/16/15  3:58 AM  Result Value Ref Range Status   Specimen Description BLOOD RIGHT WRIST  Final   Special Requests   Final    BOTTLES DRAWN AEROBIC AND ANAEROBIC 12CCAERO,6CCANA   Culture NO GROWTH 5 DAYS  Final  Report Status 07/21/2015 FINAL  Final  Culture, blood (routine x 2)     Status: None   Collection Time: 07/16/15  3:58 AM  Result Value Ref Range Status   Specimen Description BLOOD RIGHT FOREARM  Final   Special Requests   Final    BOTTLES DRAWN AEROBIC AND ANAEROBIC 12CCAERO,2CCANA   Culture NO GROWTH 5 DAYS  Final   Report Status 07/21/2015 FINAL  Final  MRSA PCR Screening     Status: None   Collection Time: 07/21/15 10:45 PM  Result Value Ref Range Status   MRSA by PCR NEGATIVE NEGATIVE Final    Comment:        The GeneXpert MRSA Assay (FDA approved for NASAL specimens only), is one component of a comprehensive MRSA colonization surveillance program. It is not intended to diagnose MRSA infection nor to guide or monitor treatment for MRSA infections.     Coagulation Studies: No results for input(s): LABPROT, INR in the last 72 hours.  Urinalysis: No results for input(s): COLORURINE, LABSPEC, PHURINE, GLUCOSEU, HGBUR, BILIRUBINUR, KETONESUR, PROTEINUR, UROBILINOGEN, NITRITE, LEUKOCYTESUR in the last 72  hours.  Invalid input(s): APPERANCEUR    Imaging: Dg Abd 1 View  07/31/2015  CLINICAL DATA:  Nausea and vomiting EXAM: ABDOMEN - 1 VIEW COMPARISON:  07/27/2015 FINDINGS: Scattered large and small bowel gas is noted. Fecal material is noted throughout colon similar to that seen on the prior exam. Degenerative changes of the lumbar spine are seen. Dialysis catheter is noted within the low pelvis. No other focal abnormality is seen. Nasogastric catheter is been removed. IMPRESSION: No significant change from the prior exam. Electronically Signed   By: Alcide Clever M.D.   On: 07/31/2015 12:07     Medications:     . antiseptic oral rinse  7 mL Mouth Rinse BID  . ARIPiprazole  2 mg Oral Daily  . aspirin EC  81 mg Oral Daily  . bisacodyl  10 mg Rectal Daily  . calcium acetate  2,001 mg Oral TID WC  . calcium carbonate  1 tablet Oral TID WC  . carvedilol  6.25 mg Oral BID WC  . clopidogrel  75 mg Oral Daily  . collagenase   Topical Daily  . docusate sodium  100 mg Oral BID  . erythromycin  250 mg Intravenous Q8H  . ezetimibe  10 mg Oral QHS  . gentamicin cream  1 application Topical Daily  . heparin  5,000 Units Subcutaneous 3 times per day  . insulin aspart  0-5 Units Subcutaneous QHS  . insulin aspart  0-9 Units Subcutaneous TID WC  . isosorbide mononitrate  60 mg Oral Daily  . levETIRAcetam  500 mg Oral q morning - 10a  . levETIRAcetam  750 mg Oral QHS  . metoCLOPramide (REGLAN) injection  5 mg Intravenous TID WC  . nystatin  1 Bottle Topical BID  . pantoprazole  40 mg Oral BID  . polyethylene glycol  17 g Oral Daily  . ranolazine  500 mg Oral BID  . sodium chloride flush  3 mL Intravenous Q12H  . tuberculin  5 Units Intradermal Once  . valproate sodium  1,000 mg Intravenous Q12H   sodium chloride, acetaminophen **OR** acetaminophen, albuterol, docusate sodium, heparin, lactulose, LORazepam, metoprolol, morphine injection, ondansetron **OR** ondansetron (ZOFRAN) IV,  oxyCODONE-acetaminophen, prochlorperazine, sodium chloride flush, zinc oxide  Assessment/ Plan:  50 y.o. white  female with past medical history of end-stage renal disease on peritoneal dialysis, asthma, gastroparesis, diabetes mellitus, congestive heart  failure, hypertension, endometriosis, coronary atherosclerosis, medical management  [facility]/[address]/peritoneal dialysis  1. End-stage renal disease on peritoneal dialysis.  CCPD- 4 exchanges 8 hours with 2.2 litre fills. 2.5% dextrose  -  Patient achieved 2.1 kg of ultrafiltration with peritoneal dialysis last night. We will continue her current regimen.   2. Acute peritonitis in PD patient. WBC cell count 690 with 73% neutrophils on admission.  - PD fluid culture is no growth. WBC count 21 on 4/25 - Empiric treatment with broad-spectrum antibiotics vancomycin (started 4/16- most recent dose 4/22) and Levaquin.   - VANC level 20 on 4/25 - no abdominal pain at this point in time.  3. Anemia chronic kidney disease.Has had seizures - Hemoccult of 7.4 at last check.  Epogen has been on hold secondary to seizures.  4. Secondary hyperparathyroidism. phos elevated at 11.7 on 4/20.- > 8.6  - calcium acetate with meals once able to tolerate diet and has diminised nausea.  5. Nausea and vomiting - NG tube placed 4/27->4/28 - Changed erythromycin to iv - Patient had side effects to reglan in the past - ? underdialyzed as patient's outpatient prescription was 3 exchanges  6. Dependent generalized edema - good ultrafiltration of 2.1 kg over the preceding 24 hours.  Continue current peritoneal dialysis regimen.    LOS: 16 Eino Whitner 5/2/20173:28 PM

## 2015-08-02 LAB — BASIC METABOLIC PANEL
ANION GAP: 15 (ref 5–15)
BUN: 51 mg/dL — ABNORMAL HIGH (ref 6–20)
CHLORIDE: 92 mmol/L — AB (ref 101–111)
CO2: 29 mmol/L (ref 22–32)
Calcium: 6.3 mg/dL — CL (ref 8.9–10.3)
Creatinine, Ser: 10.54 mg/dL — ABNORMAL HIGH (ref 0.44–1.00)
GFR calc Af Amer: 4 mL/min — ABNORMAL LOW (ref >60)
GFR calc non Af Amer: 4 mL/min — ABNORMAL LOW (ref >60)
Glucose, Bld: 105 mg/dL — ABNORMAL HIGH (ref 65–99)
POTASSIUM: 3.4 mmol/L — AB (ref 3.5–5.1)
Sodium: 136 mmol/L (ref 135–145)

## 2015-08-02 LAB — CBC
HEMATOCRIT: 22.7 % — AB (ref 35.0–47.0)
HEMOGLOBIN: 7.6 g/dL — AB (ref 12.0–16.0)
MCH: 28.3 pg (ref 26.0–34.0)
MCHC: 33.4 g/dL (ref 32.0–36.0)
MCV: 84.9 fL (ref 80.0–100.0)
Platelets: 245 10*3/uL (ref 150–440)
RBC: 2.68 MIL/uL — ABNORMAL LOW (ref 3.80–5.20)
RDW: 13.6 % (ref 11.5–14.5)
WBC: 9.2 10*3/uL (ref 3.6–11.0)

## 2015-08-02 LAB — GLUCOSE, CAPILLARY
GLUCOSE-CAPILLARY: 93 mg/dL (ref 65–99)
Glucose-Capillary: 101 mg/dL — ABNORMAL HIGH (ref 65–99)
Glucose-Capillary: 109 mg/dL — ABNORMAL HIGH (ref 65–99)

## 2015-08-02 MED ORDER — ZINC OXIDE 20 % EX OINT: TOPICAL_OINTMENT | CUTANEOUS | Status: DC | PRN

## 2015-08-02 MED ORDER — OXYCODONE-ACETAMINOPHEN 5-325 MG PO TABS: 1.0000 | ORAL_TABLET | Freq: Four times a day (QID) | ORAL | Status: DC | PRN

## 2015-08-02 MED ORDER — LEVETIRACETAM 500 MG PO TABS: 500.0000 mg | ORAL_TABLET | Freq: Every morning | ORAL | Status: DC

## 2015-08-02 MED ORDER — DIVALPROEX SODIUM 500 MG PO DR TAB: 1000.0000 mg | DELAYED_RELEASE_TABLET | Freq: Two times a day (BID) | ORAL | Status: DC

## 2015-08-02 MED ORDER — NYSTATIN 100000 UNIT/GM EX POWD: 1.0000 | Freq: Two times a day (BID) | CUTANEOUS | Status: DC

## 2015-08-02 MED ORDER — ARIPIPRAZOLE 2 MG PO TABS: 2.0000 mg | ORAL_TABLET | Freq: Every day | ORAL | Status: DC

## 2015-08-02 MED ORDER — POLYETHYLENE GLYCOL 3350 17 G PO PACK: 17.0000 g | PACK | Freq: Every day | ORAL | Status: DC

## 2015-08-02 MED ORDER — DIVALPROEX SODIUM 500 MG PO DR TAB: 1000.0000 mg | DELAYED_RELEASE_TABLET | Freq: Three times a day (TID) | ORAL | Status: DC

## 2015-08-02 MED ORDER — COLLAGENASE 250 UNIT/GM EX OINT: TOPICAL_OINTMENT | Freq: Every day | CUTANEOUS | Status: DC

## 2015-08-02 MED ORDER — CALCIUM CARBONATE ANTACID 500 MG PO CHEW: 1.0000 | CHEWABLE_TABLET | Freq: Three times a day (TID) | ORAL | Status: DC

## 2015-08-02 MED ORDER — LEVETIRACETAM 750 MG PO TABS: 750.0000 mg | ORAL_TABLET | Freq: Every day | ORAL | Status: DC

## 2015-08-02 MED ORDER — METOCLOPRAMIDE HCL 10 MG PO TABS: 10.0000 mg | ORAL_TABLET | Freq: Four times a day (QID) | ORAL | Status: DC

## 2015-08-02 NOTE — Progress Notes (Signed)
EMS here now to transport patient. Patient is dressed and all her belongings sent with her.

## 2015-08-02 NOTE — Progress Notes (Signed)
Discussed nausea episodes with patients daughter. She feels mom has done in this in past and we have tried aeverything we can do adn now it will be more of a supportive care she would like to take patient home and try mediations and diet as tolerated. FI she gets worse then wil take patient to [facility] as she has been on wait list for several days now.

## 2015-08-02 NOTE — Discharge Summary (Signed)
[facility] [place] at [facility]   PATIENT NAME: Kimberly Montgomery    MR#:  [id number]  DATE OF BIRTH:  1965-11-16  DATE OF ADMISSION:  07/15/2015 ADMITTING PHYSICIAN: Ihor Austin, MD  DATE OF DISCHARGE: 08/02/2015  PRIMARY CARE PHYSICIAN: Jarome Matin, MD    ADMISSION DIAGNOSIS:  SBP (spontaneous bacterial peritonitis) (HCC) [K65.2] Vomiting and diarrhea [R11.10, R19.7] ESRD on peritoneal dialysis (HCC) [N18.6, Z99.2] Type 2 diabetes mellitus with chronic kidney disease on chronic dialysis, unspecified long term insulin use status (HCC) [E11.22, N18.6]  DISCHARGE DIAGNOSIS:  Principal Problem:   Peritonitis (HCC) Active Problems:   Bipolar I disorder (HCC)   Seizures (HCC)   ESRD (end stage renal disease) (HCC)   Ileus (HCC)   SECONDARY DIAGNOSIS:   Past Medical History  Diagnosis Date  . Renal disorder   . Hypertension   . CHF (congestive heart failure) (HCC)   . Diabetes mellitus without complication (HCC)   . Gastroparesis   . Asthma   . ESRD (end stage renal disease) (HCC)   . Endometriosis   . CAD (coronary artery disease)   . HLD (hyperlipidemia)   . GERD (gastroesophageal reflux disease)     HOSPITAL COURSE:   50 year old female patient with history of end-stage renal disease on peritoneal dialysis, hypertension, coronary artery disease, congestive heart failure presented to the emergency room with nausea and vomiting and abdominal discomfort.   1. Intractable nausea and vomiting secondary to severe gastroparesis with constipation. During her hospitalization she had an NG tube placed for gastroparesis. She had no evidence of ileus on abdominal x-ray.  NG tube was discontinued.  Patient was placed on liquid diet and then advance to soft diet.  She is tolerating this fairly well.  She will continue erythromycin and Reglan for gastroparesis  2. Generalized tonic-clonic seizures new onset started on 07/21/2015:  Continue Depakote 1000 mg  twice a day and keppra 500 mg in a.m. and 750 mg p.m.   EEG showed metabolic changes  Repeat CT head 4/ 21 negative   3. Acute peritonitis with nausea vomiting and diffuse abdominal pain. Initial Peritoneal fluid with neutrophils greater than 70% initially, peritoneal fluid culture is negative Patient has improved. She was treated with Levofloxacin 500 mg QOD for 10 days and vancomycin completed course. As per ID recommendations, patient has improved and no need for further antibiotics unless patient has clinical signs of acute peritonitis. She had no clinical signs of acute peritonitis after treatment. Patient did not need PD catheter to be removed.   3.Left lower abdominal wall and left thigh cellulitis: This has resolved Clindamycin was been discontinued. Continue nystatin for fungal rash which is also improving.   4. Elevated TSH with past medical history of goiter status post radiation Appreciate endocrinology consultation. No need for Synthroid at this point. Patient will need follow-up TFTs in 4 weeks after discharge. Patient may have subclinical hypothyroidism but does not need replacement unless TSH is greater than 10.  5. End-stage renal disease on peritoneal dialysis Continue peritoneal dialysis as recommended by nephrology.  Patient declining to convert to hemodialysis Patient will need to have Unna boot or compression over her legs due to edema. This was discussed with patient's daughter.  6. Hypocalcemia-replete as needed 7. constipation: Continue bowel regimen at home   7. Congestive heart failure, chronic, diastolic Continue home medication aspirin 81 mg, Plavix 75 mg, Coreg    8. Myoclonic jerks due to gabapentin which was discontinued by Dr. Thad Ranger  from neurology Wellbutrin was also discontinued as per Dr. Thad Ranger.  Psych consult recommended Abilify in its place.  9.. Anemia of chronic kidney disease: Hemoglobin is stable.  10. Hyponatremia:  Improved  12. Left toe: continue enzymatic debridement.  Dressing procedure/placement/frequency:Cleanse left great toe with NS and pat gently dry. Apply Santyl to wound bed. Cover with NS moist gauze. Wrap with kerlix and tape. Change daily    DISCHARGE CONDITIONS AND DIET:   Stable for discharge on a renal diet  CONSULTS OBTAINED:  Treatment Team:  Mosetta Pigeon, MD Kym Groom, MD Annice Needy, MD Audery Amel, MD  DRUG ALLERGIES:   Allergies  Allergen Reactions  . Cephalosporins Anaphylaxis  . Penicillins Anaphylaxis and Other (See Comments)    Has patient had a PCN reaction causing immediate rash, facial/tongue/throat swelling, SOB or lightheadedness with hypotension: Yes Has patient had a PCN reaction causing severe rash involving mucus membranes or skin necrosis: No Has patient had a PCN reaction that required hospitalization No Has patient had a PCN reaction occurring within the last 10 years: No If all of the above answers are "NO", then may proceed with Cephalosporin use.  [name] Phenergan [Promethazine Hcl] Nausea And Vomiting    DISCHARGE MEDICATIONS:   Current Discharge Medication List    START taking these medications   Details  ARIPiprazole (ABILIFY) 2 MG tablet Take 1 tablet (2 mg total) by mouth daily. Qty: 30 tablet, Refills: 0    calcium acetate (PHOSLO) 667 MG capsule Take 3 capsules (2,001 mg total) by mouth 3 (three) times daily with meals. Qty: 90 capsule, Refills: 0    calcium carbonate (TUMS - DOSED IN MG ELEMENTAL CALCIUM) 500 MG chewable tablet Chew 1 tablet (200 mg of elemental calcium total) by mouth 3 (three) times daily with meals. Qty: 120 tablet, Refills: 0    collagenase (SANTYL) ointment Apply topically daily. Qty: 15 g, Refills: 0    divalproex (DEPAKOTE) 500 MG DR tablet Take 2 tablets (1,000 mg total) by mouth 3 (three) times daily. Qty: 120 tablet, Refills: 0    !! levETIRAcetam (KEPPRA) 500 MG tablet Take 1 tablet (500  mg total) by mouth every morning. Qty: 30 tablet, Refills: 0    !! levETIRAcetam (KEPPRA) 750 MG tablet Take 1 tablet (750 mg total) by mouth at bedtime. Qty: 30 tablet, Refills: 0    LORazepam (ATIVAN) 2 MG/ML injection Inject 0.5 mLs (1 mg total) into the vein every 2 (two) hours as needed for seizure. Qty: 1 mL, Refills: 0    metoCLOPramide (REGLAN) 10 MG tablet Take 1 tablet (10 mg total) by mouth 4 (four) times daily. Qty: 90 tablet, Refills: 0    nystatin (NYSTATIN) powder Apply 1 Bottle topically 2 (two) times daily. Qty: 15 g, Refills: 0    oxyCODONE-acetaminophen (PERCOCET/ROXICET) 5-325 MG tablet Take 1 tablet by mouth every 6 (six) hours as needed for moderate pain. Qty: 30 tablet, Refills: 0    !! polyethylene glycol (MIRALAX / GLYCOLAX) packet Take 17 g by mouth daily. Qty: 14 each, Refills: 0    zinc oxide 20 % ointment Apply topically as needed for irritation. Qty: 56.7 g, Refills: 0     !! - Potential duplicate medications found. Please discuss with provider.    CONTINUE these medications which have CHANGED   Details  insulin glargine (LANTUS) 100 UNIT/ML injection Inject 0.15 mLs (15 Units total) into the skin at bedtime. Qty: 10 mL, Refills: 11    ondansetron (ZOFRAN ODT)  4 MG disintegrating tablet Take 1 tablet (4 mg total) by mouth every 6 (six) hours as needed for nausea or vomiting. Qty: 20 tablet, Refills: 0      CONTINUE these medications which have NOT CHANGED   Details  acetaminophen (TYLENOL) 500 MG tablet Take 1,000 mg by mouth every 6 (six) hours as needed for mild pain.     albuterol (PROVENTIL HFA;VENTOLIN HFA) 108 (90 Base) MCG/ACT inhaler Inhale 2 puffs into the lungs every 6 (six) hours as needed for wheezing or shortness of breath.    amLODipine (NORVASC) 10 MG tablet Take 10 mg by mouth daily.    aspirin EC 81 MG tablet Take 81 mg by mouth daily.    carvedilol (COREG) 6.25 MG tablet Take 6.25 mg by mouth 2 (two) times daily with a meal.     clopidogrel (PLAVIX) 75 MG tablet Take 75 mg by mouth daily.    docusate sodium (COLACE) 100 MG capsule Take 100 mg by mouth at bedtime as needed for mild constipation.    erythromycin (E-MYCIN) 250 MG tablet Take 250 mg by mouth 4 (four) times daily.    ezetimibe-simvastatin (VYTORIN) 10-40 MG tablet Take 1 tablet by mouth at bedtime.    isosorbide mononitrate (IMDUR) 60 MG 24 hr tablet Take 1 tablet (60 mg total) by mouth daily. Qty: 30 tablet, Refills: 00    lisinopril (PRINIVIL,ZESTRIL) 40 MG tablet Take 40 mg by mouth daily. Refills: 3    nitroGLYCERIN (NITROSTAT) 0.4 MG SL tablet Place 0.4 mg under the tongue every 5 (five) minutes as needed for chest pain. Reported on 07/10/2015    omeprazole (PRILOSEC) 40 MG capsule Take 40 mg by mouth 2 (two) times daily.    !! polyethylene glycol (MIRALAX / GLYCOLAX) packet Take 17 g by mouth daily as needed for mild constipation.    ranolazine (RANEXA) 500 MG 12 hr tablet Take 1 tablet (500 mg total) by mouth 2 (two) times daily. Qty: 60 tablet, Refills: 0     !! - Potential duplicate medications found. Please discuss with provider.    STOP taking these medications     aspirin-acetaminophen-caffeine (EXCEDRIN MIGRAINE) 250-250-65 MG tablet      buPROPion (WELLBUTRIN SR) 150 MG 12 hr tablet      calcium acetate, Phos Binder, (PHOSLYRA) 667 MG/5ML SOLN      gabapentin (NEURONTIN) 600 MG tablet      insulin aspart (NOVOLOG) 100 UNIT/ML injection      torsemide (DEMADEX) 100 MG tablet               Today   CHIEF COMPLAINT:   Tolerated soft diet. Unable to put on post uses due to edema   VITAL SIGNS:  Blood pressure 125/73, pulse 71, temperature 98 F (36.7 C), temperature source Oral, resp. rate 20, height 5' 9.02" (1.753 m), weight 122.562 kg (270 lb 3.2 oz), last menstrual period 03/16/2015, SpO2 95 %.   REVIEW OF SYSTEMS:  Review of Systems  Constitutional: Negative for fever, chills and malaise/fatigue.   HENT: Negative for ear discharge, ear pain, hearing loss, nosebleeds and sore throat.   Eyes: Negative for blurred vision and pain.  Respiratory: Negative for cough, hemoptysis, shortness of breath and wheezing.   Cardiovascular: Negative for chest pain, palpitations and leg swelling.  Gastrointestinal: Negative for nausea (did failry well with diet), vomiting, abdominal pain, diarrhea and blood in stool.  Genitourinary: Negative for dysuria.  Musculoskeletal: Negative for back pain.  Neurological: Negative for dizziness,  tremors, speech change, focal weakness, seizures and headaches.  Endo/Heme/Allergies: Does not bruise/bleed easily.  Psychiatric/Behavioral: Negative for depression, suicidal ideas and hallucinations.     PHYSICAL EXAMINATION:  GENERAL:  50 y.o.-year-old patient Sitting up in the bed with no acute distress.  NECK:  Supple, no jugular venous distention. No thyroid enlargement, no tenderness.  LUNGS: Normal breath sounds bilaterally, no wheezing, rales,rhonchi  No use of accessory muscles of respiration.  CARDIOVASCULAR: S1, S2 normal. No murmurs, rubs, or gallops.  ABDOMEN: Soft, non-tender, non-distended. Bowel sounds present. No organomegaly or mass.  EXTREMITIES: 3+ pedal edema, no cyanosis, or clubbing.  PSYCHIATRIC: The patient is alert and oriented x 3.  SKIN: No obvious rash, lesion, or ulcer.   DATA REVIEW:   CBC  Recent Labs Lab 08/02/15 0424  WBC 9.2  HGB 7.6*  HCT 22.7*  PLT 245    Chemistries   Recent Labs Lab 07/29/15 0424  08/02/15 0424  NA 138  < > 136  K 3.9  < > 3.4*  CL 94*  < > 92*  CO2 27  < > 29  GLUCOSE 118*  < > 105*  BUN 61*  < > 51*  CREATININE 11.95*  < > 10.54*  CALCIUM 5.6*  < > 6.3*  AST 18  --   --   ALT 12*  --   --   ALKPHOS 77  --   --   BILITOT 0.5  --   --   < > = values in this interval not displayed.  Cardiac Enzymes No results for input(s): TROPONINI in the last 168 hours.  Microbiology Results   @MICRORSLT48 @  RADIOLOGY:  No results found.    Management plans discussed with the patient and she is in agreement. Stable for discharge [facility] Plan of care discussed with daughter. Patient should follow up with PCP in 1 week  CODE STATUS:     Code Status Orders        Start     Ordered   07/16/15 0626  Full code   Continuous     07/16/15 0625    Code Status History    Date Active Date Inactive Code Status Order ID Comments User Context   07/10/2015  1:49 AM 07/12/2015  1:01 PM Full Code [id number]  Oralia Manis, MD Inpatient   05/12/2015  3:46 PM 05/16/2015  5:35 PM Full Code [id number]  Auburn Bilberry, MD ED      TOTAL TIME TAKING CARE OF THIS PATIENT: 36 minutes.    Note: This dictation was prepared with Dragon dictation along with smaller phrase technology. Any transcriptional errors that result from this process are unintentional.  Natale Thoma M.D on 08/02/2015 at 11:37 AM  Between 7am to 6pm - Pager - [phone] After 6pm go to [email or web address] - password [email or web address]  [facility]  Office  [phone]  CC: Primary care physician; Jarome Matin, MD

## 2015-08-02 NOTE — Care Management (Signed)
Appointment scheduled with PCP, Friday May 5 @ 10:20 am. Daughter updated.

## 2015-08-02 NOTE — Evaluation (Signed)
Physical Therapy Re-Evaluation Patient Details Name: Kimberly Montgomery MRN: [id number] DOB: 07/12/1965 Today's Date: 08/02/2015   History of Present Illness  Pt is a 50 y.o. F admitted to hospital on 4/15 for peritonitis. Pt was perviously admitted to hospital for cardiac cath and discharged on 4/12. While in hospital this admission, pt has had several seizures starting on 4/21. Pt experiencing nausea/vomiting d/t severe gastroparesis. Pt currently on peritoneal dialysis. Pt last received PT on 07/20/15. PT held d/t Ca+ levels and seizures. Re-evaluated on 08/02/15. Pt has hx of end stage renal disease, CHF, HTN, and GERD. 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   Clinical Impression  Pt is showing improvement and making slow progress towards goals. Pt stated she is experiencing less pain today. Pt able to perform bed mobility with modified independence. Pt able to transfer from EOB using 2+ mod assist and RW. Pt unable to fit R LE into prosthetic, therefore unable to ambulate. Pt demonstrates good sitting balance and fair standing balance on L LE and with RW. Pt performed seated there-ex on B LE with no assist. Educated pt on keeping R LE elevated in hopes to reduce swelling soon. Pt demonstrates deficits in strength, balance, and mobility. Pt would benefit from further skilled PT to address deficits: recommend pt go to SNF after discharge from acute hospitalization. Despite PT recommendation, pt refuses to go to rehab d/t need for hemodialysis. Discussed home options with pt, including necessary equipment i.e. RW, BSC, and WC. Also recommend follow up prosthetic appointment to adjust fit of prosthesis.      Follow Up Recommendations SNF    Equipment Recommendations  Rolling walker with 5" wheels    Recommendations for Other Services       Precautions / Restrictions Precautions Precautions: Fall Restrictions Weight Bearing Restrictions: No      Mobility  Bed Mobility Overal bed mobility:  Modified Independent Bed Mobility: Supine to Sit     Supine to sit: Modified independent (Device/Increase time)     General bed mobility comments: Pt able to perform bed mobility with use of bed railings and increased time. Pt provided cues regarding hand placement during mobility.   Transfers Overall transfer level: Needs assistance Equipment used: Rolling walker (2 wheeled) Transfers: Sit to/from Stand Sit to Stand: Mod assist;+2 physical assistance         General transfer comment: Pt able to transfer from EOB using RW and 2+ mod assist. Pt required increased time to stand and upright posture and heavy reliance on B UE. Upon standing, pt unable to fit R LE into prosthetic d/t swelling.   Ambulation/Gait             General Gait Details: Unable to perform ambulation d/t prosthetic not fitting properly d/t swelling. Notified RN and MD of this.   Stairs            Wheelchair Mobility    Modified Rankin (Stroke Patients Only)       Balance Overall balance assessment: Needs assistance Sitting-balance support: Single extremity supported Sitting balance-Leahy Scale: Good Sitting balance - Comments: Pt demonstrates good sitting balance. Pt able to maintain sitting balance on EOB using UE on bed for approx 5+ mins. Pt able to put prosthetic on by herself while sitting on EOB.    Standing balance support: Bilateral upper extremity supported Standing balance-Leahy Scale: Fair Standing balance comment: Pt demonstrates fair standing balance with 2+ assist and RW. Pt unable to fit R LE  into prosthetic, therefore had to maintain balance on L LE. Pt maintained standing balance for approx 2 mins.                              Pertinent Vitals/Pain Pain Assessment: Faces Faces Pain Scale: Hurts little more Pain Intervention(s): Limited activity within patient's tolerance    Home Living Family/patient expects to be discharged to:: Private residence Living  Arrangements: Alone Available Help at Discharge: Family Type of Home: House Home Access: Stairs to enter Entrance Stairs-Rails: Right Entrance Stairs-Number of Steps: 3 Home Layout: One level Home Equipment: Walker - 4 wheels;Cane - single point Additional Comments: Pts daugther available occasionally to help with ADLs.     Prior Function Level of Independence: Independent with assistive device(s)         Comments: Prior to admission, pt ambulated with [facility] and performed all ADLs independently.      Hand Dominance        Extremity/Trunk Assessment   Upper Extremity Assessment: RUE deficits/detail;LUE deficits/detail RUE Deficits / Details: R UE grossly 4/5 strength     LUE Deficits / Details: L UE grossly 4/5 strength   Lower Extremity Assessment: RLE deficits/detail;LLE deficits/detail RLE Deficits / Details: R LE quad strength grossly 4/5  LLE Deficits / Details: L LE grossly 4/5     Communication   Communication: No difficulties  Cognition Arousal/Alertness: Awake/alert Behavior During Therapy: WFL for tasks assessed/performed Overall Cognitive Status: Within Functional Limits for tasks assessed                      General Comments      Exercises Other Exercises Other Exercises: Pt performed seated ther-ex including SLR on B LE, and marching on L LE with no assist. All ther-ex performed x10 reps.       Assessment/Plan    PT Assessment Patient needs continued PT services  PT Diagnosis Difficulty walking;Abnormality of gait;Generalized weakness   PT Problem List Decreased strength;Decreased mobility;Decreased knowledge of use of DME  PT Treatment Interventions DME instruction;Gait training;Stair training;Therapeutic activities;Therapeutic exercise;Balance training   PT Goals (Current goals can be found in the Care Plan section) Acute Rehab PT Goals Patient Stated Goal: to return to PLOF PT Goal Formulation: With patient Time For Goal  Achievement: 08/16/15 Potential to Achieve Goals: Fair    Frequency Min 2X/week   Barriers to discharge Decreased caregiver support      Co-evaluation               End of Session Equipment Utilized During Treatment: Gait belt Activity Tolerance: Other (comment) (limited by issue w/ prosthetic ) Patient left: in bed;with call bell/phone within reach;with bed alarm set;Other (comment) (Propped R LE) Nurse Communication: Mobility status         Time: 1610-9604 PT Time Calculation (min) (ACUTE ONLY): 30 min   Charges:   PT Evaluation $PT Re-evaluation: 1 Procedure PT Treatments $Therapeutic Exercise: 8-22 mins   PT G Codes:        Dorita Fray 08/03/2015, 2:31 PM M. Hettie Holstein, SPT

## 2015-08-02 NOTE — Progress Notes (Signed)
Daughter will not be at patient's home until around 1500. Informed daughter that I will call for EMS transport at 1500 unless something changes, she will call me. Daughter was on the way to [facility] to pick up binder for leg prosthesis.

## 2015-08-02 NOTE — Care Management (Signed)
Discharging home today. Rolling walker ordered from [facility]. Patient will have home health through [facility]. SN, PT, HHA and SW. Daughter in agreement with POC.

## 2015-08-02 NOTE — Progress Notes (Signed)
Discharge instructions already given to patient. Prescriptions sent to pharmacy. Pain medication prescription given to patient. EMS has been called for transport and daughter has been in contact with patient saying she is there and ready for her at the house. IV was already previously removed. CCU nurse was never able to come and get an IV site on the patient. Tele removed. Patient will follow up with PCP and home health has been set up by care management.

## 2015-08-02 NOTE — Care Management (Addendum)
Patient had less nausea yesterday. Took Zofran x 2. Ate soft diet and tolerated it. Spoke with [place] with PT. They plan to work with patient today, PT has not been working with patient due to hypocalcemia.  TC to [facility]. They do not have a bed available. TC to daughter, [name]. She will not be coming until later today. Spoke with her by phone to discuss discharge planning. She understands that [facility] does not have a bed and is not interested in transfer to another hospital. She is planning to have patient to come home with home health. Daughter is aware and agrees to potential discharge in the next 1-2 days if she continues to progress. Patient has a BSC, elevated toilet  seat and will need a walker at discharge. Daughter states if patient goes home and declines she will take her to [facility] for admission. Will follow up with attending after PT assessment.

## 2015-08-02 NOTE — Progress Notes (Signed)
Peritoneal dialysis completed/disconnected.

## 2015-08-02 NOTE — Progress Notes (Signed)
Nutrition Follow-up  DOCUMENTATION CODES:   Obesity unspecified  INTERVENTION:  -Provided pt with diet education regarding gastroparesis; encouraged small, more frequent meals; encouraged avoidance of high fat and high fiber foods as they delay gastric emptying. Encouraged pt to chew foods well prior to swallowing and drink sips of fluids throughout meal period. Encouraged pt to sit up after meal times to promote gastric emptyting, mobility will help with gastric motility as well; encourage activity as tolerated.    NUTRITION DIAGNOSIS:   Inadequate oral intake related to altered GI function as evidenced by NPO status.  Being addressed as diet advanced, adding snacks between meals  GOAL:   Patient will meet greater than or equal to 90% of their needs  MONITOR:   PO intake, Labs, Weight trends  REASON FOR ASSESSMENT:   LOS    ASSESSMENT:   50 y/o female admitted with peritonitis, nausea, vomiting.   Diet advanced to Soft, pt receiving scheduled PD; pt with gastropresis, pt feels that the erythromycin and reglan are helping. Noted orders for discharge today.  Diet Order:  Diet - low sodium heart healthy Diet - low sodium heart healthy Diet - low sodium heart healthy DIET SOFT Room service appropriate?: Yes; Fluid consistency:: Thin   Energy Intake: pt reports she is not tolerating diet well but recorded po intake 100% of meals yesterday. This AM pt ate 100% of yogurt, some of cream of wheat, bites of eggs and juice.  Digestive System:  +nausea at times, no vomiting this AM  Skin:  Reviewed, no issues  Last BM:  08/02/15   Meds: reviewede   Height:   Ht Readings from Last 1 Encounters:  07/16/15 5' 9.02" (1.753 m)    Weight:   Wt Readings from Last 1 Encounters:  08/02/15 270 lb 3.2 oz (122.562 kg)    BMI:  Body mass index is 39.88 kg/(m^2).  Estimated Nutritional Needs:   Kcal:  1980-2310 kcals/d  Protein:  (79-99 g/d  Fluid:  +  UOP  EDUCATION NEEDS:   Education needs addressed  Romelle Starcher MS, RD, LDN [phone] Pager  [phone] Weekend/On-Call Pager

## 2015-08-02 NOTE — Care Management Important Message (Signed)
Important Message  Patient Details  Name: Kimberly Montgomery MRN: [id number] Date of Birth: 19-Jul-1965   Medicare Important Message Given:  Yes    [name] A Attikus Bartoszek 08/02/2015, 9:35 AM

## 2015-08-02 NOTE — Progress Notes (Signed)
Patient is having lots of nausea this AM and does not want to take her medications at this time, will try again later. Patient's IV has also infiltrated again today. Patient will discharge, but Dr. Juliene Pina stated to get another IV if we can. ICU has been called to attempt, will wait on them.

## 2015-08-02 NOTE — Progress Notes (Signed)
Patient has had about 10 episodes of emesis on this shift so far. Emesis is brown in color and a small to moderate amount. Patient still has no IV access, but she would like to have more IV nausea medication, so CCU has been called for a second time to try and get access. Dr. Juliene Pina made aware that patient has been vomiting so much today and MD stated she will get in touch with the daughter about the discharge.

## 2015-08-02 NOTE — Progress Notes (Signed)
Daughter is currently going to [employer] to get a binder for patient's prosthesis so they can adjust and get it to fit at home. Gave leg measurement to daughter over the phone. Daughter is available to come get the patient, but she is concerned she won't get into her house since there are three steps to get in the front door. If patient cannot get her prosthesis on, she will not be able to use the stairs. Care management updated and stated they will start a medical necessity form for EMS. Patient still has no IV access at this time.

## 2015-08-02 NOTE — Progress Notes (Signed)
Post peritoneal dialysis 

## 2015-08-23 ENCOUNTER — Inpatient Hospital Stay
Admission: EM | Admit: 2015-08-23 | Discharge: 2015-08-31 | DRG: 871 | Disposition: A | Discharge status: Skilled Nursing Facility | Payer: Medicare HMO | Attending: Internal Medicine | Admitting: Internal Medicine

## 2015-08-23 ENCOUNTER — Emergency Department: Payer: Medicare HMO

## 2015-08-23 DIAGNOSIS — N186 End stage renal disease: Secondary | ICD-10-CM

## 2015-08-23 DIAGNOSIS — R4182 Altered mental status, unspecified: Secondary | ICD-10-CM | POA: Diagnosis present

## 2015-08-23 DIAGNOSIS — K659 Peritonitis, unspecified: Secondary | ICD-10-CM

## 2015-08-23 DIAGNOSIS — F3130 Bipolar disorder, current episode depressed, mild or moderate severity, unspecified: Secondary | ICD-10-CM | POA: Insufficient documentation

## 2015-08-23 DIAGNOSIS — A419 Sepsis, unspecified organism: Secondary | ICD-10-CM | POA: Diagnosis not present

## 2015-08-23 DIAGNOSIS — Z992 Dependence on renal dialysis: Secondary | ICD-10-CM

## 2015-08-23 DIAGNOSIS — E1122 Type 2 diabetes mellitus with diabetic chronic kidney disease: Secondary | ICD-10-CM

## 2015-08-23 DIAGNOSIS — R0902 Hypoxemia: Secondary | ICD-10-CM

## 2015-08-23 LAB — CBC
HCT: 21.9 % — ABNORMAL LOW (ref 35.0–47.0)
Hemoglobin: 6.9 g/dL — ABNORMAL LOW (ref 12.0–16.0)
MCH: 27.7 pg (ref 26.0–34.0)
MCHC: 31.4 g/dL — AB (ref 32.0–36.0)
MCV: 87.9 fL (ref 80.0–100.0)
PLATELETS: 467 10*3/uL — AB (ref 150–440)
RBC: 2.49 MIL/uL — AB (ref 3.80–5.20)
RDW: 14 % (ref 11.5–14.5)
WBC: 15.2 10*3/uL — ABNORMAL HIGH (ref 3.6–11.0)

## 2015-08-23 NOTE — ED Notes (Signed)
Pt to ED via EMS from home c/o altered mental status. Per EMS pt daughter reported mother seemed more confused and lethargic since this AM. Pt released from [facility] last week treated for abdominal pain, nausea, vomiting, and seizure like activity. Pt alert and oriented, in no acute distress at this time.

## 2015-08-23 NOTE — ED Notes (Signed)
2 unsuccessful PIV attempts by this RN; Dr Lenard Lance aware.

## 2015-08-23 NOTE — ED Provider Notes (Addendum)
[facility] Emergency Department Provider Note  Time seen: 10:16 PM  I have reviewed the triage vital signs and the nursing notes.   HISTORY  Chief Complaint Altered Mental Status    HPI Kimberly Montgomery is a 50 y.o. female with a past medical history of end-stage renal disease on peritoneal dialysis, hypertension, CHF, diabetes, gastroparesis, asthma, hyperlipidemia, presents the emergency department with abdominal pain, nausea, vomiting, confusion. According to the daughter with whom the patient lives she has been experiencing increased abdominal pain over the past several days. Patient was just discharged from [facility] several days ago for abdominal pain thought to be due to gastroparesis/peritonitis. Patient had her peritoneal dialysis session yesterday and a round of vancomycin was infused into the patient. The peritoneal dialysis machine failed to work tonight, and Soil scientist, ice and has remained in the patient's abdomen. Daughter states for the past 24 hours the patient has been very confused. The daughter states a history of confusion with gastroparesis episodes in the past but never to this degree. Patient does appear confused, can answer simple yes no questions with questionable accuracy, otherwise cannot provide a history. Does appear to be in pain, states her abdomen hurts but cannot quantify or qualify the discomfort.     Past Medical History  Diagnosis Date  . Renal disorder   . Hypertension   . CHF (congestive heart failure) (HCC)   . Diabetes mellitus without complication (HCC)   . Gastroparesis   . Asthma   . ESRD (end stage renal disease) (HCC)   . Endometriosis   . CAD (coronary artery disease)   . HLD (hyperlipidemia)   . GERD (gastroesophageal reflux disease)     Patient Active Problem List   Diagnosis Date Noted  . ESRD (end stage renal disease) (HCC)   . Ileus (HCC)   . Bipolar I disorder (HCC) 07/25/2015  . Seizures (HCC)  07/25/2015  . Peritonitis (HCC) 07/16/2015  . Unstable angina (HCC) 07/09/2015  . ESRD on peritoneal dialysis (HCC) 07/09/2015  . Accelerated hypertension 07/09/2015  . Type 2 diabetes mellitus (HCC) 07/09/2015  . CAD (coronary artery disease) 07/09/2015  . HLD (hyperlipidemia) 07/09/2015  . GERD (gastroesophageal reflux disease) 07/09/2015  . Chest pain 05/12/2015    Past Surgical History  Procedure Laterality Date  . Hysterotomy    . Below knee leg amputation    . Pd tube inserstion    . Hd fistula surgery with reversal    . Cardiac catheterization Right 07/10/2015    Procedure: Left Heart Cath and Coronary Angiography;  Surgeon: Laurier Nancy, MD;  Location: [facility] INVASIVE CV LAB;  Service: Cardiovascular;  Laterality: Right;    Current Outpatient Rx  Name  Route  Sig  Dispense  Refill  . acetaminophen (TYLENOL) 500 MG tablet   Oral   Take 1,000 mg by mouth every 6 (six) hours as needed for mild pain.          [name] albuterol (PROVENTIL HFA;VENTOLIN HFA) 108 (90 Base) MCG/ACT inhaler   Inhalation   Inhale 2 puffs into the lungs every 6 (six) hours as needed for wheezing or shortness of breath.         [name] amLODipine (NORVASC) 10 MG tablet   Oral   Take 10 mg by mouth daily.         . ARIPiprazole (ABILIFY) 2 MG tablet   Oral   Take 1 tablet (2 mg total) by mouth daily.   30 tablet  0   . aspirin EC 81 MG tablet   Oral   Take 81 mg by mouth daily.         . calcium acetate (PHOSLO) 667 MG capsule   Oral   Take 3 capsules (2,001 mg total) by mouth 3 (three) times daily with meals.   90 capsule   0   . calcium carbonate (TUMS - DOSED IN MG ELEMENTAL CALCIUM) 500 MG chewable tablet   Oral   Chew 1 tablet (200 mg of elemental calcium total) by mouth 3 (three) times daily with meals.   120 tablet   0   . carvedilol (COREG) 6.25 MG tablet   Oral   Take 6.25 mg by mouth 2 (two) times daily with a meal.         . clopidogrel (PLAVIX) 75 MG tablet   Oral    Take 75 mg by mouth daily.         . collagenase (SANTYL) ointment   Topical   Apply topically daily.   15 g   0   . divalproex (DEPAKOTE) 500 MG DR tablet   Oral   Take 2 tablets (1,000 mg total) by mouth 2 (two) times daily.   120 tablet   0   . docusate sodium (COLACE) 100 MG capsule   Oral   Take 100 mg by mouth at bedtime as needed for mild constipation.         [name] erythromycin (E-MYCIN) 250 MG tablet   Oral   Take 250 mg by mouth 4 (four) times daily.         [name] ezetimibe-simvastatin (VYTORIN) 10-40 MG tablet   Oral   Take 1 tablet by mouth at bedtime.         . insulin glargine (LANTUS) 100 UNIT/ML injection   Subcutaneous   Inject 0.15 mLs (15 Units total) into the skin at bedtime.   10 mL   11   . isosorbide mononitrate (IMDUR) 60 MG 24 hr tablet   Oral   Take 1 tablet (60 mg total) by mouth daily.   30 tablet   00   . levETIRAcetam (KEPPRA) 500 MG tablet   Oral   Take 1 tablet (500 mg total) by mouth every morning.   30 tablet   0   . levETIRAcetam (KEPPRA) 750 MG tablet   Oral   Take 1 tablet (750 mg total) by mouth at bedtime.   30 tablet   0   . lisinopril (PRINIVIL,ZESTRIL) 40 MG tablet   Oral   Take 40 mg by mouth daily.      3   . LORazepam (ATIVAN) 2 MG/ML injection   Intravenous   Inject 0.5 mLs (1 mg total) into the vein every 2 (two) hours as needed for seizure.   1 mL   0   . metoCLOPramide (REGLAN) 10 MG tablet   Oral   Take 1 tablet (10 mg total) by mouth 4 (four) times daily.   90 tablet   0   . nitroGLYCERIN (NITROSTAT) 0.4 MG SL tablet   Sublingual   Place 0.4 mg under the tongue every 5 (five) minutes as needed for chest pain. Reported on 07/10/2015         . nystatin (NYSTATIN) powder   Topical   Apply 1 Bottle topically 2 (two) times daily.   15 g   0   . omeprazole (PRILOSEC) 40 MG capsule   Oral   Take 40  mg by mouth 2 (two) times daily.         . ondansetron (ZOFRAN ODT) 4 MG disintegrating  tablet   Oral   Take 1 tablet (4 mg total) by mouth every 6 (six) hours as needed for nausea or vomiting.   20 tablet   0   . oxyCODONE-acetaminophen (PERCOCET/ROXICET) 5-325 MG tablet   Oral   Take 1 tablet by mouth every 6 (six) hours as needed for moderate pain.   30 tablet   0   . polyethylene glycol (MIRALAX / GLYCOLAX) packet   Oral   Take 17 g by mouth daily as needed for mild constipation.         . polyethylene glycol (MIRALAX / GLYCOLAX) packet   Oral   Take 17 g by mouth daily.   14 each   0   . ranolazine (RANEXA) 500 MG 12 hr tablet   Oral   Take 1 tablet (500 mg total) by mouth 2 (two) times daily.   60 tablet   0   . zinc oxide 20 % ointment   Topical   Apply topically as needed for irritation.   56.7 g   0     Allergies Cephalosporins; Penicillins; and Phenergan  Family History  Problem Relation Age of Onset  . CAD    . Diabetes    . Bipolar disorder    . Cervical cancer Mother     Social History Social History  Substance Use Topics  . Smoking status: Current Every Day Smoker -- 0.50 packs/day    Types: Cigarettes  . Smokeless tobacco: None  . Alcohol Use: No    Review of Systems Per daughter denies fever. States nausea and vomiting. States abdominal pain. Patient cannot provide an adequate review of systems due to altered mental status.  ____________________________________________   PHYSICAL EXAM:  VITAL SIGNS: ED Triage Vitals  Enc Vitals Group     BP 08/23/15 2201 121/67 mmHg     Pulse Rate 08/23/15 2201 117     Resp 08/23/15 2201 20     Temp 08/23/15 2201 99 F (37.2 C)     Temp Source 08/23/15 2201 Oral     SpO2 08/23/15 2201 93 %     Weight 08/23/15 2201 279 lb (126.554 kg)     Height 08/23/15 2201  (1.651 m)     Head Cir --      Peak Flow --      Pain Score 08/23/15 2204 10     Pain Loc --      Pain Edu? --      Excl. in GC? --     Constitutional: Alert. We'll make eye contact. Patient will occasionally  answer questions if asked repeatedly. Patient does perseverate on her answers. Appears to be altered.  Eyes: Normal exam ENT   Head: Normocephalic and atraumatic. Cardiovascular: Normal rate, regular rhythm. No murmurs, rubs, or gallops. Respiratory: Normal respiratory effort without tachypnea nor retractions. Breath sounds are clear Gastrointestinal:Soft, mild diffuse abdominal tenderness palpation. No apparent rebound or guarding. Mild distention/obese abdomen with dull percussion. Peritoneal dialysis in place with no obvious signs of infection around insertion site.   Musculoskeletal: Nontender with normal range of motion in all extremities.  Neurologic:  Normal speech and language. No gross focal neurologic deficits  Skin:  Skin is warm, dry and intact.  Psychiatric Appears altered.   ____________________________________________   INITIAL IMPRESSION / ASSESSMENT AND PLAN / ED COURSE  Pertinent  labs & imaging results that were available during my care of the patient were reviewed by me and considered in my medical decision making (see chart for details).  Patient presents the emergency department altered mental status/confusion for the past 24 hours. Patient also has increased abdominal pain, nausea and vomiting for the past 2-3 days. Patient recently discharged from [facility] hospital with a diagnosis of gastroparesis and possible peritonitis. Patient was receiving vancomycin with therapy are now dialysis sessions, the last dose was yesterday. Patient is currently afebrile, she is tachycardic around 115 bpm. Given her altered mental status we will check labs including urinalysis, obtain a chest x-ray for imaging, attempt to obtain a PD catheter fluid sample for culture. We will monitor the patient closely in the emergency department while awaiting lab results.   Patient care signed out to oncoming physician.  EKG reviewed and interpreted by myself shows sinus tachycardia 111 bpm, occasional  PVC, narrow complex QRS with normal axis, nonspecific ST changes. No ST elevations.  ____________________________________________   FINAL CLINICAL IMPRESSION(S) / ED DIAGNOSES  Confusion/altered mental status   Minna Antis, MD 08/26/15 1638  Minna Antis, MD 09/10/15 1443

## 2015-08-24 ENCOUNTER — Emergency Department: Payer: Medicare HMO

## 2015-08-24 DIAGNOSIS — T859XXA Unspecified complication of internal prosthetic device, implant and graft, initial encounter: Secondary | ICD-10-CM | POA: Diagnosis present

## 2015-08-24 DIAGNOSIS — R4182 Altered mental status, unspecified: Secondary | ICD-10-CM | POA: Diagnosis present

## 2015-08-24 DIAGNOSIS — K3184 Gastroparesis: Secondary | ICD-10-CM | POA: Diagnosis present

## 2015-08-24 DIAGNOSIS — Z880 Allergy status to penicillin: Secondary | ICD-10-CM | POA: Diagnosis not present

## 2015-08-24 DIAGNOSIS — Y92009 Unspecified place in unspecified non-institutional (private) residence as the place of occurrence of the external cause: Secondary | ICD-10-CM

## 2015-08-24 DIAGNOSIS — Z882 Allergy status to sulfonamides status: Secondary | ICD-10-CM

## 2015-08-24 DIAGNOSIS — I2510 Atherosclerotic heart disease of native coronary artery without angina pectoris: Secondary | ICD-10-CM | POA: Diagnosis present

## 2015-08-24 DIAGNOSIS — Z7982 Long term (current) use of aspirin: Secondary | ICD-10-CM | POA: Diagnosis not present

## 2015-08-24 DIAGNOSIS — G92 Toxic encephalopathy: Secondary | ICD-10-CM | POA: Diagnosis present

## 2015-08-24 DIAGNOSIS — Z794 Long term (current) use of insulin: Secondary | ICD-10-CM

## 2015-08-24 DIAGNOSIS — Z89519 Acquired absence of unspecified leg below knee: Secondary | ICD-10-CM | POA: Diagnosis not present

## 2015-08-24 DIAGNOSIS — F419 Anxiety disorder, unspecified: Secondary | ICD-10-CM | POA: Diagnosis present

## 2015-08-24 DIAGNOSIS — I5032 Chronic diastolic (congestive) heart failure: Secondary | ICD-10-CM | POA: Diagnosis present

## 2015-08-24 DIAGNOSIS — D631 Anemia in chronic kidney disease: Secondary | ICD-10-CM | POA: Diagnosis present

## 2015-08-24 DIAGNOSIS — N186 End stage renal disease: Secondary | ICD-10-CM | POA: Diagnosis present

## 2015-08-24 DIAGNOSIS — F17210 Nicotine dependence, cigarettes, uncomplicated: Secondary | ICD-10-CM | POA: Diagnosis present

## 2015-08-24 DIAGNOSIS — Y848 Other medical procedures as the cause of abnormal reaction of the patient, or of later complication, without mention of misadventure at the time of the procedure: Secondary | ICD-10-CM | POA: Diagnosis present

## 2015-08-24 DIAGNOSIS — E785 Hyperlipidemia, unspecified: Secondary | ICD-10-CM | POA: Diagnosis present

## 2015-08-24 DIAGNOSIS — K219 Gastro-esophageal reflux disease without esophagitis: Secondary | ICD-10-CM | POA: Diagnosis present

## 2015-08-24 DIAGNOSIS — J45909 Unspecified asthma, uncomplicated: Secondary | ICD-10-CM | POA: Diagnosis present

## 2015-08-24 DIAGNOSIS — Z888 Allergy status to other drugs, medicaments and biological substances status: Secondary | ICD-10-CM | POA: Diagnosis not present

## 2015-08-24 DIAGNOSIS — F3130 Bipolar disorder, current episode depressed, mild or moderate severity, unspecified: Secondary | ICD-10-CM | POA: Diagnosis not present

## 2015-08-24 DIAGNOSIS — A419 Sepsis, unspecified organism: Principal | ICD-10-CM | POA: Diagnosis present

## 2015-08-24 DIAGNOSIS — E1143 Type 2 diabetes mellitus with diabetic autonomic (poly)neuropathy: Secondary | ICD-10-CM | POA: Diagnosis present

## 2015-08-24 DIAGNOSIS — N2581 Secondary hyperparathyroidism of renal origin: Secondary | ICD-10-CM | POA: Diagnosis present

## 2015-08-24 DIAGNOSIS — K659 Peritonitis, unspecified: Secondary | ICD-10-CM | POA: Diagnosis present

## 2015-08-24 DIAGNOSIS — Z6839 Body mass index (BMI) 39.0-39.9, adult: Secondary | ICD-10-CM

## 2015-08-24 DIAGNOSIS — I132 Hypertensive heart and chronic kidney disease with heart failure and with stage 5 chronic kidney disease, or end stage renal disease: Secondary | ICD-10-CM | POA: Diagnosis present

## 2015-08-24 DIAGNOSIS — F319 Bipolar disorder, unspecified: Secondary | ICD-10-CM | POA: Diagnosis present

## 2015-08-24 DIAGNOSIS — Z992 Dependence on renal dialysis: Secondary | ICD-10-CM | POA: Diagnosis not present

## 2015-08-24 DIAGNOSIS — E1122 Type 2 diabetes mellitus with diabetic chronic kidney disease: Secondary | ICD-10-CM | POA: Diagnosis present

## 2015-08-24 LAB — COMPREHENSIVE METABOLIC PANEL
ALK PHOS: 105 U/L (ref 38–126)
ALT: 7 U/L — AB (ref 14–54)
AST: 13 U/L — AB (ref 15–41)
Albumin: 1.5 g/dL — ABNORMAL LOW (ref 3.5–5.0)
Anion gap: 16 — ABNORMAL HIGH (ref 5–15)
BILIRUBIN TOTAL: 0.6 mg/dL (ref 0.3–1.2)
BUN: 75 mg/dL — AB (ref 6–20)
CALCIUM: 8.3 mg/dL — AB (ref 8.9–10.3)
CHLORIDE: 96 mmol/L — AB (ref 101–111)
CO2: 26 mmol/L (ref 22–32)
CREATININE: 10.03 mg/dL — AB (ref 0.44–1.00)
GFR, EST AFRICAN AMERICAN: 5 mL/min — AB (ref >60)
GFR, EST NON AFRICAN AMERICAN: 4 mL/min — AB (ref >60)
Glucose, Bld: 150 mg/dL — ABNORMAL HIGH (ref 65–99)
Potassium: 4.3 mmol/L (ref 3.5–5.1)
Sodium: 138 mmol/L (ref 135–145)
TOTAL PROTEIN: 5.4 g/dL — AB (ref 6.5–8.1)

## 2015-08-24 LAB — CBC
HCT: 19.4 % — ABNORMAL LOW (ref 35.0–47.0)
Hemoglobin: 6.1 g/dL — ABNORMAL LOW (ref 12.0–16.0)
MCH: 27.8 pg (ref 26.0–34.0)
MCHC: 31.5 g/dL — AB (ref 32.0–36.0)
MCV: 88.1 fL (ref 80.0–100.0)
PLATELETS: 439 10*3/uL (ref 150–440)
RBC: 2.2 MIL/uL — ABNORMAL LOW (ref 3.80–5.20)
RDW: 13.9 % (ref 11.5–14.5)
WBC: 12.9 10*3/uL — ABNORMAL HIGH (ref 3.6–11.0)

## 2015-08-24 LAB — GLUCOSE, CAPILLARY
GLUCOSE-CAPILLARY: 132 mg/dL — AB (ref 65–99)
GLUCOSE-CAPILLARY: 186 mg/dL — AB (ref 65–99)
Glucose-Capillary: 158 mg/dL — ABNORMAL HIGH (ref 65–99)

## 2015-08-24 LAB — BASIC METABOLIC PANEL
Anion gap: 15 (ref 5–15)
BUN: 74 mg/dL — ABNORMAL HIGH (ref 6–20)
CALCIUM: 7.8 mg/dL — AB (ref 8.9–10.3)
CO2: 25 mmol/L (ref 22–32)
CREATININE: 9.81 mg/dL — AB (ref 0.44–1.00)
Chloride: 98 mmol/L — ABNORMAL LOW (ref 101–111)
GFR calc Af Amer: 5 mL/min — ABNORMAL LOW (ref >60)
GFR, EST NON AFRICAN AMERICAN: 4 mL/min — AB (ref >60)
GLUCOSE: 183 mg/dL — AB (ref 65–99)
POTASSIUM: 3.9 mmol/L (ref 3.5–5.1)
SODIUM: 138 mmol/L (ref 135–145)

## 2015-08-24 LAB — PREPARE RBC (CROSSMATCH)

## 2015-08-24 LAB — LIPASE, BLOOD: Lipase: 14 U/L (ref 11–51)

## 2015-08-24 LAB — AMMONIA: AMMONIA: 29 umol/L (ref 9–35)

## 2015-08-24 LAB — ABO/RH: ABO/RH(D): A POS

## 2015-08-24 LAB — LACTIC ACID, PLASMA: Lactic Acid, Venous: 1 mmol/L (ref 0.5–2.0)

## 2015-08-24 LAB — VALPROIC ACID LEVEL: VALPROIC ACID LVL: 28 ug/mL — AB (ref 50.0–100.0)

## 2015-08-24 MED ORDER — ACETAMINOPHEN 325 MG PO TABS
650.0000 mg | ORAL_TABLET | Freq: Four times a day (QID) | ORAL | Status: DC | PRN
  Administered 2015-08-24 – 2015-08-26 (×2): 650 mg via ORAL
  Filled 2015-08-24 (×2): qty 2

## 2015-08-24 MED ORDER — INSULIN ASPART 100 UNIT/ML ~~LOC~~ SOLN
0.0000 [IU] | Freq: Every day | SUBCUTANEOUS | Status: DC
  Administered 2015-08-26 – 2015-08-29 (×4): 2 [IU] via SUBCUTANEOUS
  Filled 2015-08-24 (×3): qty 2

## 2015-08-24 MED ORDER — DIPHENHYDRAMINE HCL 25 MG PO CAPS
25.0000 mg | ORAL_CAPSULE | ORAL | Status: AC
  Administered 2015-08-24: 25 mg via ORAL
  Filled 2015-08-24: qty 1

## 2015-08-24 MED ORDER — LEVOFLOXACIN IN D5W 750 MG/150ML IV SOLN
750.0000 mg | Freq: Once | INTRAVENOUS | Status: AC
  Administered 2015-08-24: 750 mg via INTRAVENOUS

## 2015-08-24 MED ORDER — ALBUTEROL SULFATE (2.5 MG/3ML) 0.083% IN NEBU: 3.0000 mL | INHALATION_SOLUTION | Freq: Four times a day (QID) | RESPIRATORY_TRACT | Status: DC | PRN

## 2015-08-24 MED ORDER — SODIUM CHLORIDE 0.9% FLUSH
3.0000 mL | Freq: Two times a day (BID) | INTRAVENOUS | Status: DC
  Administered 2015-08-24 – 2015-08-30 (×11): 3 mL via INTRAVENOUS

## 2015-08-24 MED ORDER — GENTAMICIN SULFATE 0.1 % EX CREA
1.0000 "application " | TOPICAL_CREAM | Freq: Every day | CUTANEOUS | Status: DC
  Administered 2015-08-25 – 2015-08-31 (×4): 1 via TOPICAL
  Filled 2015-08-24: qty 15

## 2015-08-24 MED ORDER — EZETIMIBE-SIMVASTATIN 10-40 MG PO TABS: 1.0000 | ORAL_TABLET | Freq: Every day | ORAL | Status: DC

## 2015-08-24 MED ORDER — ACETAMINOPHEN 650 MG RE SUPP: 650.0000 mg | Freq: Four times a day (QID) | RECTAL | Status: DC | PRN

## 2015-08-24 MED ORDER — VANCOMYCIN HCL IN DEXTROSE 1-5 GM/200ML-% IV SOLN
1000.0000 mg | Freq: Once | INTRAVENOUS | Status: AC
  Administered 2015-08-24: 1000 mg via INTRAVENOUS
  Filled 2015-08-24: qty 200

## 2015-08-24 MED ORDER — NALOXONE HCL 2 MG/2ML IJ SOSY
PREFILLED_SYRINGE | INTRAMUSCULAR | Status: AC
  Administered 2015-08-24: 1 mg via INTRAVENOUS
  Filled 2015-08-24: qty 2

## 2015-08-24 MED ORDER — EZETIMIBE 10 MG PO TABS
10.0000 mg | ORAL_TABLET | Freq: Every day | ORAL | Status: DC
Start: 2015-08-24 — End: 2015-08-31
  Administered 2015-08-24 – 2015-08-30 (×7): 10 mg via ORAL
  Filled 2015-08-24 (×7): qty 1

## 2015-08-24 MED ORDER — OXYCODONE HCL 5 MG PO TABS
5.0000 mg | ORAL_TABLET | ORAL | Status: DC | PRN
  Administered 2015-08-24 – 2015-08-31 (×13): 5 mg via ORAL
  Filled 2015-08-24 (×13): qty 1

## 2015-08-24 MED ORDER — HEPARIN SODIUM (PORCINE) 5000 UNIT/ML IJ SOLN
5000.0000 [IU] | Freq: Three times a day (TID) | INTRAMUSCULAR | Status: DC
  Administered 2015-08-24 – 2015-08-31 (×18): 5000 [IU] via SUBCUTANEOUS
  Filled 2015-08-24 (×18): qty 1

## 2015-08-24 MED ORDER — DOCUSATE SODIUM 100 MG PO CAPS: 100.0000 mg | ORAL_CAPSULE | Freq: Every evening | ORAL | Status: DC | PRN

## 2015-08-24 MED ORDER — LACTULOSE 10 GM/15ML PO SOLN: 10.0000 g | Freq: Every day | ORAL | Status: DC | PRN

## 2015-08-24 MED ORDER — ZINC OXIDE 20 % EX OINT
TOPICAL_OINTMENT | CUTANEOUS | Status: DC | PRN
  Filled 2015-08-24: qty 28.35

## 2015-08-24 MED ORDER — ONDANSETRON HCL 4 MG PO TABS: 4.0000 mg | ORAL_TABLET | Freq: Four times a day (QID) | ORAL | Status: DC | PRN

## 2015-08-24 MED ORDER — VANCOMYCIN HCL IN DEXTROSE 1-5 GM/200ML-% IV SOLN
1000.0000 mg | Freq: Once | INTRAVENOUS | Status: AC
  Administered 2015-08-24: 1000 mg via INTRAVENOUS

## 2015-08-24 MED ORDER — LEVOFLOXACIN IN D5W 500 MG/100ML IV SOLN
500.0000 mg | INTRAVENOUS | Status: DC
  Administered 2015-08-25 – 2015-08-29 (×3): 500 mg via INTRAVENOUS
  Filled 2015-08-24 (×4): qty 100

## 2015-08-24 MED ORDER — ONDANSETRON HCL 4 MG/2ML IJ SOLN
4.0000 mg | Freq: Once | INTRAMUSCULAR | Status: AC
  Administered 2015-08-24: 4 mg via INTRAVENOUS

## 2015-08-24 MED ORDER — INSULIN ASPART 100 UNIT/ML ~~LOC~~ SOLN
0.0000 [IU] | Freq: Three times a day (TID) | SUBCUTANEOUS | Status: DC
  Administered 2015-08-25 – 2015-08-26 (×5): 1 [IU] via SUBCUTANEOUS
  Administered 2015-08-26: 2 [IU] via SUBCUTANEOUS
  Administered 2015-08-27: 1 [IU] via SUBCUTANEOUS
  Administered 2015-08-27 – 2015-08-28 (×3): 3 [IU] via SUBCUTANEOUS
  Administered 2015-08-28: 1 [IU] via SUBCUTANEOUS
  Administered 2015-08-29: 2 [IU] via SUBCUTANEOUS
  Administered 2015-08-29 (×2): 3 [IU] via SUBCUTANEOUS
  Administered 2015-08-30 – 2015-08-31 (×4): 1 [IU] via SUBCUTANEOUS
  Filled 2015-08-24: qty 1
  Filled 2015-08-24: qty 2
  Filled 2015-08-24: qty 1
  Filled 2015-08-24: qty 3
  Filled 2015-08-24 (×4): qty 1
  Filled 2015-08-24: qty 2
  Filled 2015-08-24: qty 3
  Filled 2015-08-24 (×3): qty 1
  Filled 2015-08-24: qty 3
  Filled 2015-08-24 (×2): qty 1
  Filled 2015-08-24 (×2): qty 2
  Filled 2015-08-24: qty 1
  Filled 2015-08-24 (×2): qty 3

## 2015-08-24 MED ORDER — RANOLAZINE ER 500 MG PO TB12
500.0000 mg | ORAL_TABLET | Freq: Two times a day (BID) | ORAL | Status: DC
  Administered 2015-08-24 – 2015-08-31 (×13): 500 mg via ORAL
  Filled 2015-08-24 (×15): qty 1

## 2015-08-24 MED ORDER — NITROGLYCERIN 0.4 MG SL SUBL: 0.4000 mg | SUBLINGUAL_TABLET | SUBLINGUAL | Status: DC | PRN

## 2015-08-24 MED ORDER — CLOPIDOGREL BISULFATE 75 MG PO TABS
75.0000 mg | ORAL_TABLET | Freq: Every day | ORAL | Status: DC
  Administered 2015-08-24 – 2015-08-31 (×7): 75 mg via ORAL
  Filled 2015-08-24 (×7): qty 1

## 2015-08-24 MED ORDER — ASPIRIN EC 81 MG PO TBEC
81.0000 mg | DELAYED_RELEASE_TABLET | Freq: Every day | ORAL | Status: DC
  Administered 2015-08-24 – 2015-08-31 (×7): 81 mg via ORAL
  Filled 2015-08-24 (×7): qty 1

## 2015-08-24 MED ORDER — LEVOFLOXACIN IN D5W 750 MG/150ML IV SOLN
INTRAVENOUS | Status: AC
  Administered 2015-08-24: 750 mg via INTRAVENOUS
  Filled 2015-08-24: qty 150

## 2015-08-24 MED ORDER — VANCOMYCIN HCL IN DEXTROSE 1-5 GM/200ML-% IV SOLN
INTRAVENOUS | Status: AC
  Administered 2015-08-24: 1000 mg via INTRAVENOUS
  Filled 2015-08-24: qty 200

## 2015-08-24 MED ORDER — SIMVASTATIN 40 MG PO TABS: 40.0000 mg | ORAL_TABLET | Freq: Every day | ORAL | Status: DC

## 2015-08-24 MED ORDER — CALCITRIOL 0.25 MCG PO CAPS
0.2500 ug | ORAL_CAPSULE | Freq: Every day | ORAL | Status: DC
  Administered 2015-08-24 – 2015-08-31 (×7): 0.25 ug via ORAL
  Filled 2015-08-24 (×10): qty 1

## 2015-08-24 MED ORDER — NYSTATIN 100000 UNIT/GM EX POWD
1.0000 | Freq: Two times a day (BID) | CUTANEOUS | Status: DC
  Administered 2015-08-24 – 2015-08-31 (×11): 1 via TOPICAL
  Filled 2015-08-24: qty 15

## 2015-08-24 MED ORDER — ONDANSETRON HCL 4 MG/2ML IJ SOLN
4.0000 mg | Freq: Four times a day (QID) | INTRAMUSCULAR | Status: DC | PRN
  Administered 2015-08-26 – 2015-08-30 (×2): 4 mg via INTRAVENOUS
  Filled 2015-08-24 (×2): qty 2

## 2015-08-24 MED ORDER — SODIUM CHLORIDE 0.9 % IV SOLN
INTRAVENOUS | Status: DC
  Administered 2015-08-24: 04:00:00 via INTRAVENOUS

## 2015-08-24 MED ORDER — SIMVASTATIN 20 MG PO TABS
20.0000 mg | ORAL_TABLET | Freq: Every day | ORAL | Status: DC
  Administered 2015-08-24: 20 mg via ORAL
  Filled 2015-08-24: qty 1

## 2015-08-24 MED ORDER — PANTOPRAZOLE SODIUM 40 MG PO TBEC
40.0000 mg | DELAYED_RELEASE_TABLET | Freq: Every day | ORAL | Status: DC
  Administered 2015-08-24 – 2015-08-31 (×7): 40 mg via ORAL
  Filled 2015-08-24 (×8): qty 1

## 2015-08-24 MED ORDER — RISPERIDONE 1 MG PO TABS
1.0000 mg | ORAL_TABLET | Freq: Every day | ORAL | Status: DC
  Administered 2015-08-24 – 2015-08-30 (×7): 1 mg via ORAL
  Filled 2015-08-24 (×7): qty 1

## 2015-08-24 MED ORDER — DELFLEX-LC/2.5% DEXTROSE 394 MOSM/L IP SOLN
INTRAPERITONEAL | Status: DC
  Administered 2015-08-24: 19:00:00 via INTRAPERITONEAL
  Filled 2015-08-24 (×6): qty 3000

## 2015-08-24 MED ORDER — SODIUM CHLORIDE 0.9 % IV SOLN
Freq: Once | INTRAVENOUS | Status: AC
  Administered 2015-08-24: 13:00:00 via INTRAVENOUS

## 2015-08-24 MED ORDER — SENNA 8.6 MG PO TABS: 2.0000 | ORAL_TABLET | Freq: Every evening | ORAL | Status: DC | PRN

## 2015-08-24 MED ORDER — ONDANSETRON HCL 4 MG/2ML IJ SOLN
INTRAMUSCULAR | Status: AC
  Administered 2015-08-24: 4 mg via INTRAVENOUS
  Filled 2015-08-24: qty 2

## 2015-08-24 MED ORDER — EZETIMIBE 10 MG PO TABS: 10.0000 mg | ORAL_TABLET | Freq: Every day | ORAL | Status: DC

## 2015-08-24 MED ORDER — NALOXONE HCL 2 MG/2ML IJ SOSY
1.0000 mg | PREFILLED_SYRINGE | Freq: Once | INTRAMUSCULAR | Status: AC
  Administered 2015-08-24: 1 mg via INTRAVENOUS

## 2015-08-24 MED ORDER — CALCIUM ACETATE (PHOS BINDER) 667 MG PO CAPS
2001.0000 mg | ORAL_CAPSULE | Freq: Three times a day (TID) | ORAL | Status: DC
  Administered 2015-08-25 – 2015-08-31 (×16): 2001 mg via ORAL
  Filled 2015-08-24 (×19): qty 3

## 2015-08-24 MED ORDER — ISOSORBIDE MONONITRATE ER 30 MG PO TB24: 60.0000 mg | ORAL_TABLET | Freq: Every day | ORAL | Status: DC

## 2015-08-24 NOTE — ED Notes (Signed)
Sherilyn Cooter RN called pharmacy to confirm that Levaquin and Vancomycin are compatible. Pharmacy stated that it can be run on the same line. Antibiotics were started.

## 2015-08-24 NOTE — Progress Notes (Addendum)
Pharmacy Antibiotic Note  Kimberly Montgomery is a 50 y.o. female admitted on 08/23/2015 with systemic inflammatory response syndrome.  Pharmacy has been consulted for levofloxacin and vancomycin dosing. Note: patient is on peritoneal dialysis   Plan: 1. Levofloxacin 750 mg IV x 1 in ED followed by levofloxacin 500 mg IV Q48H. 2. Vancomycin 1 gm IV x 1 in ED. Give additional 1 gm IV x 1. Will dose intravenously and based off levels at this point.  Height: 5\' 5"  (165.1 cm) Weight: 279 lb (126.554 kg) IBW/kg (Calculated) : 57  Temp (24hrs), Avg:99 F (37.2 C), Min:99 F (37.2 C), Max:99 F (37.2 C)   Recent Labs Lab 08/23/15 2319  WBC 15.2*  CREATININE 10.03*  LATICACIDVEN 1.0    Estimated Creatinine Clearance: 8.9 mL/min (by C-G formula based on Cr of 10.03).    Allergies  Allergen Reactions  . Cephalosporins Anaphylaxis  . Penicillins Anaphylaxis and Other (See Comments)    Has patient had a PCN reaction causing immediate rash, facial/tongue/throat swelling, SOB or lightheadedness with hypotension: Yes Has patient had a PCN reaction causing severe rash involving mucus membranes or skin necrosis: No Has patient had a PCN reaction that required hospitalization No Has patient had a PCN reaction occurring within the last 10 years: No If all of the above answers are "NO", then may proceed with Cephalosporin use.  [name] Phenergan [Promethazine Hcl] Nausea And Vomiting    Thank you for allowing pharmacy to be a part of this patient's care.  Carola Frost, Pharm.D., BCPS Clinical Pharmacist 08/24/2015 1:44 AM

## 2015-08-24 NOTE — ED Notes (Addendum)
Pt requested to be placed on the bedpan to have a BM. Pt was placed on the bedpan and had a BM.

## 2015-08-24 NOTE — ED Notes (Signed)
Pt vomited. Dr Dolores Frame was notified, antiemetic was ordered for Pt.

## 2015-08-24 NOTE — Progress Notes (Addendum)
[facility] - [address] at [facility]   PATIENT NAME: Kimberly Montgomery    MR#:  [id number]  DATE OF BIRTH:  02-13-1966  SUBJECTIVE:  CHIEF COMPLAINT:   Chief Complaint  Patient presents with  . Altered Mental Status   Weakness. REVIEW OF SYSTEMS:  CONSTITUTIONAL: No fever, fatigue or weakness.  EYES: No blurred or double vision.  EARS, NOSE, AND THROAT: No tinnitus or ear pain.  RESPIRATORY: No cough, shortness of breath, wheezing or hemoptysis.  CARDIOVASCULAR: No chest pain, orthopnea, edema.  GASTROINTESTINAL: No nausea, vomiting, diarrhea or abdominal pain. No melena or bloody stool. GENITOURINARY: No dysuria, hematuria.  ENDOCRINE: No polyuria, nocturia,  HEMATOLOGY: No anemia, easy bruising or bleeding SKIN: No rash or lesion. MUSCULOSKELETAL: No joint pain or arthritis.   NEUROLOGIC: No tingling, numbness, weakness.  PSYCHIATRY: No anxiety or depression.   DRUG ALLERGIES:   Allergies  Allergen Reactions  . Cephalosporins Anaphylaxis  . Penicillins Anaphylaxis and Other (See Comments)    Has patient had a PCN reaction causing immediate rash, facial/tongue/throat swelling, SOB or lightheadedness with hypotension: Yes Has patient had a PCN reaction causing severe rash involving mucus membranes or skin necrosis: No Has patient had a PCN reaction that required hospitalization No Has patient had a PCN reaction occurring within the last 10 years: No If all of the above answers are "NO", then may proceed with Cephalosporin use.  [name] Phenergan [Promethazine Hcl] Nausea And Vomiting    VITALS:  Blood pressure 108/52, pulse 101, temperature 98.7 F (37.1 C), temperature source Oral, resp. rate 18, height  (1.753 m), weight 270 lb 4.8 oz (122.607 kg), last menstrual period 03/16/2015, SpO2 100 %.  PHYSICAL EXAMINATION:  GENERAL:  50 y.o.-year-old patient lying in the bed with no acute distress. Morbid obese. EYES: Pupils equal, round, reactive to  light and accommodation. No scleral icterus. Extraocular muscles intact.  HEENT: Head atraumatic, normocephalic. Oropharynx and nasopharynx clear.  NECK:  Supple, no jugular venous distention. No thyroid enlargement, no tenderness.  LUNGS: Normal breath sounds bilaterally, no wheezing, rales,rhonchi or crepitation. No use of accessory muscles of respiration.  CARDIOVASCULAR: S1, S2 normal. No murmurs, rubs, or gallops.  ABDOMEN: Soft, nontender, nondistended. Bowel sounds present. No organomegaly or mass.  EXTREMITIES: No pedal edema, cyanosis, or clubbing. Right BKA. NEUROLOGIC: Cranial nerves II through XII are intact. Muscle strength 5/5 in all extremities. Sensation intact. Gait not checked.  PSYCHIATRIC: The patient is alert and oriented x 3.  SKIN: No obvious rash, lesion, or ulcer.    LABORATORY PANEL:   CBC  Recent Labs Lab 08/24/15 0452  WBC 12.9*  HGB 6.1*  HCT 19.4*  PLT 439   ------------------------------------------------------------------------------------------------------------------  Chemistries   Recent Labs Lab 08/23/15 2319 08/24/15 0452  NA 138 138  K 4.3 3.9  CL 96* 98*  CO2 26 25  GLUCOSE 150* 183*  BUN 75* 74*  CREATININE 10.03* 9.81*  CALCIUM 8.3* 7.8*  AST 13*  --   ALT 7*  --   ALKPHOS 105  --   BILITOT 0.6  --    ------------------------------------------------------------------------------------------------------------------  Cardiac Enzymes No results for input(s): TROPONINI in the last 168 hours. ------------------------------------------------------------------------------------------------------------------  RADIOLOGY:  Dg Chest Portable 1 View  08/23/2015  CLINICAL DATA:  Altered mental status.  Anterior chest pain 2 days. EXAM: PORTABLE CHEST 1 VIEW COMPARISON:  07/09/2015 and 05/12/2015 FINDINGS: Lungs are adequately inflated without consolidation or effusion. Cardiomediastinal silhouette and remainder of the exam is unchanged.  IMPRESSION:  No active disease. Electronically Signed   By: Elberta Fortis M.D.   On: 08/23/2015 22:38    EKG:   Orders placed or performed during the hospital encounter of 08/23/15  . ED EKG  . ED EKG  . EKG 12-Lead  . EKG 12-Lead    ASSESSMENT AND PLAN:   50 year old female patient with history of end-stage renal disease on peritoneal dialysis, hypertension, anemia, type 2 diabetes mellitus, congestive heart failure presented to the emergency room with lethargy confusion.  1. Acute encephalopathy, possible due to percocet. Improved.  2. Sepsis, rule out peritonitis.  Continue IV vancomycin and IV Levaquin, Follow-up CBC, blood cultures and peritoneal fluid analysis and cultures.  3. End-stage renal disease on peritoneal dialysis. PD tonight.  4. Hypertension. Low side BP,  imdur and torsemide are on hold.  5.  Anemia of chronic disease. No active bleeding. Hb down to from 6.9 to 6.1. Gave 1 units PRBC transfusion. F/u Hb in am.   All the records are reviewed and case discussed with Care Management/Social Workerr. Management plans discussed with the patient, family and they are in agreement.  CODE STATUS: full code.  TOTAL TIME TAKING CARE OF THIS PATIENT:36 minutes.  Greater than 50% time was spent on coordination of care and face-to-face counseling.  POSSIBLE D/C IN 2 DAYS, DEPENDING ON CLINICAL CONDITION.   Shaune Pollack M.D on 08/24/2015 at 3:41 PM  Between 7am to 6pm - Pager - [phone]  After 6pm go to [email or web address] - password [email or web address] [facility]  [facility]  Office  [phone]  CC: Primary care physician; Jarome Matin, MD

## 2015-08-24 NOTE — ED Notes (Signed)
Pt was given narcan and responded to it. Pt became more alert. Sherilyn Cooter RN asked the Pt if she took any narcotics and she stated that someone gave her some Percosets but she does not remember who it was.

## 2015-08-24 NOTE — ED Notes (Signed)
Pt's bedding was changed. Dr. Dolores Frame was notified of Pt desaturation. Pt was placed on [place] 2 LPM by Sherilyn Cooter RN to bring O2 up.

## 2015-08-24 NOTE — H&P (Signed)
[facility] - [place] at [facility]   PATIENT NAME: Kimberly Montgomery    MR#:  [id number]  DATE OF BIRTH:  12-25-1965  DATE OF ADMISSION:  08/23/2015  PRIMARY CARE PHYSICIAN: Jarome Matin, MD   REQUESTING/REFERRING PHYSICIAN:   CHIEF COMPLAINT:   Chief Complaint  Patient presents with  . Altered Mental Status    HISTORY OF PRESENT ILLNESS: Kimberly Montgomery  is a 50 y.o. female with a known history of End-stage renal disease on peritoneal dialysis, hypertension, congestive heart failure, diabetes mellitus, coronary artery disease, hyperlipidemia was recently discharged from [facility]. Patient again came back to the emergency room at our hospital for lethargy and confusion. [name] was not available at bedside. Patient is awake and responds to verbal commands but not completely oriented. She is lethargic and sleepy. Last bowel movement was sent yesterday morning. Patient was given IV Narcan in the emergency room. Plan was to send peritoneal fluid for analysis to evaluate for any peritonitis. Patient was started on broad-spectrum IV antibiotics and her WBC count was little elevated. Patient recently completed course of vancomycin antibiotic after hospitalization at [facility]. Patient has history of anemia of chronic disease hemoglobin around 6.9. Not much history could be obtained from the patient. In the emergency room patient's oxygen saturation also dropped she was put on oxygen via nasal cannula. Her WBC count during the workup is 15,000. Ammonia level during workup is normal.  PAST MEDICAL HISTORY:   Past Medical History  Diagnosis Date  . Renal disorder   . Hypertension   . CHF (congestive heart failure) (HCC)   . Diabetes mellitus without complication (HCC)   . Gastroparesis   . Asthma   . ESRD (end stage renal disease) (HCC)   . Endometriosis   . CAD (coronary artery disease)   . HLD (hyperlipidemia)   . GERD (gastroesophageal  reflux disease)     PAST SURGICAL HISTORY: Past Surgical History  Procedure Laterality Date  . Hysterotomy    . Below knee leg amputation    . Pd tube inserstion    . Hd fistula surgery with reversal    . Cardiac catheterization Right 07/10/2015    Procedure: Left Heart Cath and Coronary Angiography;  Surgeon: Laurier Nancy, MD;  Location: [facility] INVASIVE CV LAB;  Service: Cardiovascular;  Laterality: Right;    SOCIAL HISTORY:  Social History  Substance Use Topics  . Smoking status: Current Every Day Smoker -- 0.50 packs/day    Types: Cigarettes  . Smokeless tobacco: Not on file  . Alcohol Use: No    FAMILY HISTORY:  Family History  Problem Relation Age of Onset  . CAD    . Diabetes    . Bipolar disorder    . Cervical cancer Mother     DRUG ALLERGIES:  Allergies  Allergen Reactions  . Cephalosporins Anaphylaxis  . Penicillins Anaphylaxis and Other (See Comments)    Has patient had a PCN reaction causing immediate rash, facial/tongue/throat swelling, SOB or lightheadedness with hypotension: Yes Has patient had a PCN reaction causing severe rash involving mucus membranes or skin necrosis: No Has patient had a PCN reaction that required hospitalization No Has patient had a PCN reaction occurring within the last 10 years: No If all of the above answers are "NO", then may proceed with Cephalosporin use.  [name] Phenergan [Promethazine Hcl] Nausea And Vomiting    REVIEW OF SYSTEMS:  Could not be obtained secondary to patients mental  status. MEDICATIONS AT HOME:  Prior to Admission medications   Medication Sig Start Date End Date Taking? Authorizing Provider  acetaminophen (TYLENOL) 325 MG tablet Take 650 mg by mouth every 6 (six) hours as needed.   Yes Historical Provider, MD  albuterol (PROVENTIL HFA;VENTOLIN HFA) 108 (90 Base) MCG/ACT inhaler Inhale 2 puffs into the lungs every 6 (six) hours as needed for wheezing or shortness of breath.   Yes Historical Provider, MD  aspirin  EC 81 MG tablet Take 81 mg by mouth daily.   Yes Historical Provider, MD  calcitRIOL (ROCALTROL) 0.25 MCG capsule Take 0.25 mcg by mouth daily.   Yes Historical Provider, MD  calcium acetate, Phos Binder, (PHOSLYRA) 667 MG/5ML SOLN Take 1,334 mg by mouth 3 (three) times daily with meals.   Yes Historical Provider, MD  calcium acetate, Phos Binder, (PHOSLYRA) 667 MG/5ML SOLN Take 667 mg by mouth 2 (two) times daily. With snacks   Yes Historical Provider, MD  clopidogrel (PLAVIX) 75 MG tablet Take 75 mg by mouth daily.   Yes Historical Provider, MD  divalproex (DEPAKOTE) 500 MG DR tablet Take 2 tablets (1,000 mg total) by mouth 2 (two) times daily. Patient taking differently: Take 1,000 mg by mouth 3 (three) times daily.  08/02/15  Yes Adrian Saran, MD  docusate sodium (COLACE) 100 MG capsule Take 100 mg by mouth at bedtime as needed for mild constipation.   Yes Historical Provider, MD  erythromycin (E-MYCIN) 250 MG tablet Take 250 mg by mouth 4 (four) times daily.   Yes Historical Provider, MD  ezetimibe-simvastatin (VYTORIN) 10-40 MG tablet Take 1 tablet by mouth at bedtime.   Yes Historical Provider, MD  gentamicin cream (GARAMYCIN) 0.1 % Apply 1 application topically every morning.   Yes Historical Provider, MD  insulin aspart (NOVOLOG) 100 UNIT/ML injection Inject 5-15 Units into the skin 3 (three) times daily with meals. 15 units three times a day with meals, 5 units with snacks   Yes Historical Provider, MD  insulin glargine (LANTUS) 100 UNIT/ML injection Inject 0.15 mLs (15 Units total) into the skin at bedtime. Patient taking differently: Inject 5 Units into the skin at bedtime.  07/22/15  Yes Enedina Finner, MD  lactulose (CHRONULAC) 10 GM/15ML solution Take 10 g by mouth daily as needed for mild constipation.   Yes Historical Provider, MD  levETIRAcetam (KEPPRA) 500 MG tablet Take 1 tablet (500 mg total) by mouth every morning. Patient taking differently: Take 500 mg by mouth 2 (two) times daily.   08/02/15  Yes Sital Mody, MD  lisinopril (PRINIVIL,ZESTRIL) 40 MG tablet Take 40 mg by mouth daily. 06/26/15  Yes Historical Provider, MD  metolazone (ZAROXOLYN) 5 MG tablet Take 5 mg by mouth daily as needed (fluid).   Yes Historical Provider, MD  nitroGLYCERIN (NITROSTAT) 0.4 MG SL tablet Place 0.4 mg under the tongue every 5 (five) minutes as needed for chest pain. Reported on 07/10/2015   Yes Historical Provider, MD  nystatin (NYSTATIN) powder Apply 1 Bottle topically 2 (two) times daily. 08/02/15  Yes Adrian Saran, MD  omeprazole (PRILOSEC) 40 MG capsule Take 40 mg by mouth 2 (two) times daily.   Yes Historical Provider, MD  ondansetron (ZOFRAN ODT) 4 MG disintegrating tablet Take 1 tablet (4 mg total) by mouth every 6 (six) hours as needed for nausea or vomiting. 07/16/15  Yes Sharyn Creamer, MD  oxyCODONE (OXY IR/ROXICODONE) 5 MG immediate release tablet Take 5 mg by mouth every 6 (six) hours as needed for severe  pain.   Yes Historical Provider, MD  polyethylene glycol (MIRALAX / GLYCOLAX) packet Take 17 g by mouth daily. 08/02/15  Yes Adrian Saran, MD  risperiDONE (RISPERDAL) 1 MG tablet Take 1 mg by mouth at bedtime.   Yes Historical Provider, MD  senna (SENOKOT) 8.6 MG TABS tablet Take 2 tablets by mouth at bedtime as needed for mild constipation.   Yes Historical Provider, MD  torsemide (DEMADEX) 100 MG tablet Take 100 mg by mouth daily.   Yes Historical Provider, MD  zinc oxide 20 % ointment Apply topically as needed for irritation. 08/02/15  Yes Sital Mody, MD  ARIPiprazole (ABILIFY) 2 MG tablet Take 1 tablet (2 mg total) by mouth daily. Patient not taking: Reported on 08/23/2015 08/02/15   Adrian Saran, MD  calcium acetate (PHOSLO) 667 MG capsule Take 3 capsules (2,001 mg total) by mouth 3 (three) times daily with meals. Patient not taking: Reported on 08/23/2015 07/22/15   Enedina Finner, MD  calcium carbonate (TUMS - DOSED IN MG ELEMENTAL CALCIUM) 500 MG chewable tablet Chew 1 tablet (200 mg of elemental calcium  total) by mouth 3 (three) times daily with meals. Patient not taking: Reported on 08/23/2015 08/02/15   Adrian Saran, MD  collagenase (SANTYL) ointment Apply topically daily. Patient not taking: Reported on 08/23/2015 08/02/15   Adrian Saran, MD  isosorbide mononitrate (IMDUR) 60 MG 24 hr tablet Take 1 tablet (60 mg total) by mouth daily. Patient not taking: Reported on 08/23/2015 07/11/15   Auburn Bilberry, MD  levETIRAcetam (KEPPRA) 750 MG tablet Take 1 tablet (750 mg total) by mouth at bedtime. Patient not taking: Reported on 08/23/2015 08/02/15   Adrian Saran, MD  LORazepam (ATIVAN) 2 MG/ML injection Inject 0.5 mLs (1 mg total) into the vein every 2 (two) hours as needed for seizure. Patient not taking: Reported on 08/23/2015 07/22/15   Enedina Finner, MD  metoCLOPramide (REGLAN) 10 MG tablet Take 1 tablet (10 mg total) by mouth 4 (four) times daily. Patient not taking: Reported on 08/23/2015 08/02/15   Adrian Saran, MD  oxyCODONE-acetaminophen (PERCOCET/ROXICET) 5-325 MG tablet Take 1 tablet by mouth every 6 (six) hours as needed for moderate pain. Patient not taking: Reported on 08/23/2015 08/02/15   Adrian Saran, MD  ranolazine (RANEXA) 500 MG 12 hr tablet Take 1 tablet (500 mg total) by mouth 2 (two) times daily. Patient not taking: Reported on 08/23/2015 07/11/15   Auburn Bilberry, MD      PHYSICAL EXAMINATION:   VITAL SIGNS: Blood pressure 130/62, pulse 116, temperature 99 F (37.2 C), temperature source Oral, resp. rate 15, height 5\' 5"  (1.651 m), weight 126.554 kg (279 lb), last menstrual period 03/16/2015, SpO2 97 %.  GENERAL:  50 y.o.-year-old patient lying in the bed with no acute distress.  EYES: Pupils equal, round, reactive to light and accommodation. No scleral icterus. Extraocular muscles intact.  HEENT: Head atraumatic, normocephalic. Oropharynx dry and nasopharynx clear.  NECK:  Supple, no jugular venous distention. No thyroid enlargement, no tenderness.  LUNGS: Normal breath sounds bilaterally, no  wheezing, rales,rhonchi or crepitation. No use of accessory muscles of respiration.  CARDIOVASCULAR: S1, S2 normal. No murmurs, rubs, or gallops.  ABDOMEN: Soft, nontender, nondistended. Bowel sounds present. No organomegaly or mass. Peritoneal dialysis catheter noted. EXTREMITIES:Right below knee amputation noted, No pedal edema left lower extremity, no cyanosis, or clubbing.  NEUROLOGIC: Cranial nerves II through XII are intact. Moves all extremities. Alert and awake , but not completely oriented to time, place and person. PSYCHIATRIC: could  not be assessed  SKIN: No obvious rash, lesion, or ulcer.   LABORATORY PANEL:   CBC  Recent Labs Lab 08/23/15 2319  WBC 15.2*  HGB 6.9*  HCT 21.9*  PLT 467*  MCV 87.9  MCH 27.7  MCHC 31.4*  RDW 14.0   ------------------------------------------------------------------------------------------------------------------  Chemistries   Recent Labs Lab 08/23/15 2319  NA 138  K 4.3  CL 96*  CO2 26  GLUCOSE 150*  BUN 75*  CREATININE 10.03*  CALCIUM 8.3*  AST 13*  ALT 7*  ALKPHOS 105  BILITOT 0.6   ------------------------------------------------------------------------------------------------------------------ estimated creatinine clearance is 8.9 mL/min (by C-G formula based on Cr of 10.03). ------------------------------------------------------------------------------------------------------------------ No results for input(s): TSH, T4TOTAL, T3FREE, THYROIDAB in the last 72 hours.  Invalid input(s): FREET3   Coagulation profile No results for input(s): INR, PROTIME in the last 168 hours. ------------------------------------------------------------------------------------------------------------------- No results for input(s): DDIMER in the last 72 hours. -------------------------------------------------------------------------------------------------------------------  Cardiac Enzymes No results for input(s): CKMB, TROPONINI,  MYOGLOBIN in the last 168 hours.  Invalid input(s): CK ------------------------------------------------------------------------------------------------------------------ Invalid input(s): POCBNP  ---------------------------------------------------------------------------------------------------------------  Urinalysis    Component Value Date/Time   COLORURINE YELLOW* 07/15/2015 2223   COLORURINE Straw 12/31/2013 1818   APPEARANCEUR CLEAR* 07/15/2015 2223   APPEARANCEUR Clear 12/31/2013 1818   LABSPEC 1.015 07/15/2015 2223   LABSPEC 1.009 12/31/2013 1818   PHURINE 6.0 07/15/2015 2223   PHURINE 6.0 12/31/2013 1818   GLUCOSEU >500* 07/15/2015 2223   GLUCOSEU 150 mg/dL 40/98/1191 4782   HGBUR 1+* 07/15/2015 2223   HGBUR Negative 12/31/2013 1818   BILIRUBINUR NEGATIVE 07/15/2015 2223   BILIRUBINUR Negative 12/31/2013 1818   KETONESUR TRACE* 07/15/2015 2223   KETONESUR Negative 12/31/2013 1818   PROTEINUR >500* 07/15/2015 2223   PROTEINUR 100 mg/dL 95/62/1308 6578   NITRITE NEGATIVE 07/15/2015 2223   NITRITE Negative 12/31/2013 1818   LEUKOCYTESUR NEGATIVE 07/15/2015 2223   LEUKOCYTESUR Negative 12/31/2013 1818     RADIOLOGY: Dg Chest Portable 1 View  08/23/2015  CLINICAL DATA:  Altered mental status.  Anterior chest pain 2 days. EXAM: PORTABLE CHEST 1 VIEW COMPARISON:  07/09/2015 and 05/12/2015 FINDINGS: Lungs are adequately inflated without consolidation or effusion. Cardiomediastinal silhouette and remainder of the exam is unchanged. IMPRESSION: No active disease. Electronically Signed   By: Elberta Fortis M.D.   On: 08/23/2015 22:38    EKG: Orders placed or performed during the hospital encounter of 08/23/15  . ED EKG  . ED EKG  . EKG 12-Lead  . EKG 12-Lead    IMPRESSION AND PLAN: 50 year old female patient with history of end-stage renal disease on peritoneal dialysis, hypertension, anemia, type 2 diabetes mellitus, congestive heart failure presented to the emergency  room with lethargy confusion. Admitting diagnosis 1. Altered mental status 2. Systemic inflammatory response syndrome 3. End-stage renal disease on peritoneal dialysis 4. Hypertension 5. Leukocytosis 6. Chronic anemia Treatment plan Admit patient to stepdown unit Start patient on IV vancomycin and IV Levaquin antibiotics Nephrology consultation for dialysis and management Follow-up WBC count DVT prophylaxis with subcutaneous heparin 5000 units every 8 hourly Follow-up hemoglobin and hematocrit if necessary PRBC transfusion Gentle IV fluid hydration Follow-up cultures and peritoneal fluid analysis for peritonitis. Follow up CT head report.  All the records are reviewed and case discussed with ED provider. Management plans discussed with the patient, family and they are in agreement.  CODE STATUS:FULL Code Status History    Date Active Date Inactive Code Status Order ID Comments User Context   07/16/2015  6:26 AM 08/02/2015  6:48 PM Full Code [id number]  Ihor Austin, MD ED   07/10/2015  1:49 AM 07/12/2015  1:01 PM Full Code [id number]  Oralia Manis, MD Inpatient   05/12/2015  3:46 PM 05/16/2015  5:35 PM Full Code [id number]  Auburn Bilberry, MD ED       TOTAL TIME TAKING CARE OF THIS PATIENT: 50 minutes.    Ihor Austin M.D on 08/24/2015 at 1:23 AM  Between 7am to 6pm - Pager - [phone]  After 6pm go to [email or web address] - password [email or web address] [facility]  [place] [facility]  Office  [phone]  CC: Primary care physician; Jarome Matin, MD

## 2015-08-24 NOTE — Progress Notes (Signed)
[facility]  ROUNDING NOTE   Subjective:  Patient brought in for altered mental status. This a.m. she appears to be a bit more coherent. She reports that she took half of her bottle of Percocet in over 3 days. She was reportedly lethargic at home. She also had low oxygen saturation upon admission. She was recently admitted to [facility] from 08/04/15 to 08/18/15.   Objective:  Vital signs in last 24 hours:  Temp:  [98.5 F (36.9 C)-99 F (37.2 C)] 98.5 F (36.9 C) (05/25 0401) Pulse Rate:  [106-124] 106 (05/25 0401) Resp:  [15-22] 22 (05/25 0401) BP: (109-160)/(61-107) 136/62 mmHg (05/25 0401) SpO2:  [93 %-100 %] 98 % (05/25 0401) Weight:  [122.607 kg (270 lb 4.8 oz)-126.554 kg (279 lb)] 122.607 kg (270 lb 4.8 oz) (05/25 0402)  Weight change:  Filed Weights   08/23/15 2201 08/24/15 0402  Weight: 126.554 kg (279 lb) 122.607 kg (270 lb 4.8 oz)    Intake/Output:     Intake/Output this shift:  Total I/O In: 240 [P.O.:240] Out: -   Physical Exam: General: NAD, resting in bed  Head: Normocephalic, atraumatic. Moist oral mucosal membranes  Eyes: Anicteric  Neck: Supple, trachea midline  Lungs:  Clear to auscultation  Heart: Regular rate and rhythm no rubs  Abdomen:  Soft, nontender, BS present  Extremities: 1+ LLE edema, R BKA  Neurologic: Awake, having difficulty concentrating, moving all four extremities  Skin: No lesions  Access: PD catheter in place, no drainage    Basic Metabolic Panel:  Recent Labs Lab 08/23/15 2319 08/24/15 0452  NA 138 138  K 4.3 3.9  CL 96* 98*  CO2 26 25  GLUCOSE 150* 183*  BUN 75* 74*  CREATININE 10.03* 9.81*  CALCIUM 8.3* 7.8*    Liver Function Tests:  Recent Labs Lab 08/23/15 2319  AST 13*  ALT 7*  ALKPHOS 105  BILITOT 0.6  PROT 5.4*  ALBUMIN 1.5*    Recent Labs Lab 08/23/15 2319  LIPASE 14    Recent Labs Lab 08/23/15 2319  AMMONIA 29    CBC:  Recent Labs Lab 08/23/15 2319  08/24/15 0452  WBC 15.2* 12.9*  HGB 6.9* 6.1*  HCT 21.9* 19.4*  MCV 87.9 88.1  PLT 467* 439    Cardiac Enzymes: No results for input(s): CKTOTAL, CKMB, CKMBINDEX, TROPONINI in the last 168 hours.  BNP: Invalid input(s): POCBNP  CBG:  Recent Labs Lab 08/24/15 0033  GLUCAP 158*    Microbiology: Results for orders placed or performed during the hospital encounter of 08/23/15  Culture, blood (routine x 2)     Status: None (Preliminary result)   Collection Time: 08/24/15 12:54 AM  Result Value Ref Range Status   Specimen Description BLOOD RIGHT ARM  Final   Special Requests BOTTLES DRAWN AEROBIC AND ANAEROBIC  Final   Culture NO GROWTH < 12 HOURS  Final   Report Status PENDING  Incomplete  Culture, blood (routine x 2)     Status: None (Preliminary result)   Collection Time: 08/24/15 12:54 AM  Result Value Ref Range Status   Specimen Description BLOOD RIGHT ANTECUBITAL  Final   Special Requests BOTTLES DRAWN AEROBIC AND ANAEROBIC  Final   Culture NO GROWTH < 12 HOURS  Final   Report Status PENDING  Incomplete    Coagulation Studies: No results for input(s): LABPROT, INR in the last 72 hours.  Urinalysis: No results for input(s): COLORURINE, LABSPEC, PHURINE, GLUCOSEU, HGBUR, BILIRUBINUR, KETONESUR, PROTEINUR,  UROBILINOGEN, NITRITE, LEUKOCYTESUR in the last 72 hours.  Invalid input(s): APPERANCEUR    Imaging: Dg Chest Portable 1 View  08/23/2015  CLINICAL DATA:  Altered mental status.  Anterior chest pain 2 days. EXAM: PORTABLE CHEST 1 VIEW COMPARISON:  07/09/2015 and 05/12/2015 FINDINGS: Lungs are adequately inflated without consolidation or effusion. Cardiomediastinal silhouette and remainder of the exam is unchanged. IMPRESSION: No active disease. Electronically Signed   By: Elberta Fortis M.D.   On: 08/23/2015 22:38     Medications:   . sodium chloride 75 mL/hr at 08/24/15 0417   . sodium chloride   Intravenous Once  . aspirin EC  81 mg Oral Daily   . calcitRIOL  0.25 mcg Oral Q breakfast  . calcium acetate  2,001 mg Oral TID WC  . clopidogrel  75 mg Oral Daily  . ezetimibe  10 mg Oral QHS   And  . simvastatin  40 mg Oral QHS  . heparin  5,000 Units Subcutaneous Q8H  . isosorbide mononitrate  60 mg Oral Daily  . [START ON 08/25/2015] levofloxacin (LEVAQUIN) IV  500 mg Intravenous Q48H  . nystatin  1 Bottle Topical BID  . pantoprazole  40 mg Oral QAC breakfast  . ranolazine  500 mg Oral BID  . risperiDONE  1 mg Oral QHS  . sodium chloride flush  3 mL Intravenous Q12H   acetaminophen **OR** acetaminophen, albuterol, docusate sodium, lactulose, nitroGLYCERIN, ondansetron **OR** ondansetron (ZOFRAN) IV, oxyCODONE, senna, zinc oxide  Assessment/ Plan:  50 y.o. female with past medical history of end-stage renal disease on peritoneal dialysis, asthma, gastroparesis, diabetes mellitus, congestive heart failure, hypertension, endometriosis, coronary atherosclerosis/medical management, PD peritonitis with micrococcus treated with meropenem and IV/IP vancomycin  [facility]/[address]/peritoneal dialysis  1. End-stage renal disease on peritoneal dialysis.  CCPD- 4 exchanges 8 hours with 2.2 litre fills. 2.5% dextrose  - We will restart pt on PD tonight, will need to take PD fluid cultures given 2 recent bouts of PD peritonitis.  2.  Leukocytosis:  White count 12.9 at the moment.  Check PD fluid clutures.  Pt currently on levofloxacin. Consider ID consultation.  Pt had recent micrococcus peritonitis at [facility].  3.  Anemia of CKD:  hgb down to 6.1.  Consider blood transfusion of at least one unit prbcs, defer to hospitalist.  4.  AMS:  Could be related to excess consumption of percocet in a short period of time, unclear if pt able to take care of herself at home.     LOS: 0 Shine Mikes 5/25/201711:03 AM

## 2015-08-24 NOTE — Care Management (Addendum)
Patient had recent discharge from [facility] 5/2 and then admitted to [facility] 5/5.  Patient was discharged from [facility] 5/19 with home health through [facility]. She is esrd with PD.   Patient presented this admission with altered mental status.  Throughout the initial assessment she became somnolent and responded to narcan.  It is documented that she may have taken one half of her prescription of Percocet.  Daughter [name] is at bedside.  She verbalizes that patient needs placement because [name] can not longer meet patient's care needs due to patients debilitated state.  "Home health comes but it is not enough."   Patient on previous admissions has been adamant about not receiving hemodialysis.  Today she is in agreement to initiate this so that she may transfer to a skilled nursing facility for short term rehab.  [name] says the only facility that they will accept is [facility].  Discussed at length to keep an open mind regarding facilities because if [facility] could not offer, would discharge home with resumption of home health if declined any other bed offers.  Patient verbalized need to consider other facilities in front of daughter.  Care manager added that patient is competent to make this decision. Patient is alert and oriented during this interaction and very much participating in discussion.  Have paged nephrology to discuss hemodialysis.  In the mean time, CSW has been informed that [facility] will accept patient on PD but family would have to perform all treatments.   Spoke with daughter [name].  She verbalizes full understanding that she would have to see patient daily- every evening to hook up and morning to disconnect.  Discussed that return to acute setting would not be appropriate reason for admission if she would not be able to fulfill the total responsibility for PD.  Discussed for her to anticipate discharge within 24-48 hours if remains medically stable.  CM has not spoken with nephrology as of yet.   Will not repage to discuss hemodialysis if this is not going to be needed.  There has been no official bed offer at this time.  Obtained order for physical therapy consult.  Patient will require authorization from her insurance company.  Instructed patient to have her prosthesis brought in.  She says there is no need because it does not fit.  Asked her to have it brought in non the less so can be assessed.

## 2015-08-24 NOTE — Progress Notes (Signed)
Dr. Imogene Burn notified that patient states that she usually takes benadryl prior to blood transfusion. Patient states that her skin is itching prior to blood transfusion. Benadryl ordered and patient given benadryl as requested once verified by pharmacy. One unit of blood infusing as ordered. Daughter came in today and states that she will bring patient prothesis but it does not fit at this time.

## 2015-08-24 NOTE — ED Provider Notes (Signed)
-----------------------------------------   12:55 AM on 08/24/2015 -----------------------------------------  Called to bedside for patient somnolent and hypoxic. Patient arouses to voice. Room air saturation 79%; increased to 97% on 2 L nasal cannula. Dialysis nurse has not yet come to collect peritoneal dialysis fluid. However, I am concerned for intra-abdominal infection causing this acute change in altered mentation. For this reason I will go ahead and order blood cultures and broad-spectrum antibiotics to be infused. Discuss with hospitalist Dr. Tobi Bastos who will evaluate patient in the emergency department. We will also proceed with CT head to evaluate for intracranial pathology. Patient will also receive IV Narcan as it appears she is on opioid pain medicines.  ----------------------------------------- 1:10 AM on 08/24/2015 -----------------------------------------  Patient had good response to Narcan. Now having a bowel movement on the bedpan. States "someone gave me some Percocets". Nursing confirmed with hospitalist to cancel CT head.  Irean Hong, MD 08/24/15 [phone]

## 2015-08-24 NOTE — [place] FL2 (Signed)
[place] MEDICAID FL2 LEVEL OF CARE SCREENING TOOL     IDENTIFICATION  Patient Name: Kimberly Montgomery Birthdate: 1965-06-07 Sex: female Admission Date (Current Location): 08/23/2015  [place] and [place] Number:  Chiropodist and Address:  [facility], [address], [place], [place] [address]      Provider Number: [id number]  Attending Physician Name and Address:  Shaune Pollack, MD  Relative Name and Phone Number:       Current Level of Care: Hospital Recommended Level of Care: Skilled Nursing Facility Prior Approval Number:    Date Approved/Denied:   PASRR Number:    Discharge Plan: SNF    Current Diagnoses: Patient Active Problem List   Diagnosis Date Noted  . Altered mental status 08/24/2015  . ESRD (end stage renal disease) (HCC)   . Ileus (HCC)   . Bipolar I disorder (HCC) 07/25/2015  . Seizures (HCC) 07/25/2015  . Peritonitis (HCC) 07/16/2015  . Unstable angina (HCC) 07/09/2015  . ESRD on peritoneal dialysis (HCC) 07/09/2015  . Accelerated hypertension 07/09/2015  . Type 2 diabetes mellitus (HCC) 07/09/2015  . CAD (coronary artery disease) 07/09/2015  . HLD (hyperlipidemia) 07/09/2015  . GERD (gastroesophageal reflux disease) 07/09/2015  . Chest pain 05/12/2015    Orientation RESPIRATION BLADDER Height & Weight     Self, Time, Situation, Place  Normal Incontinent Weight: 270 lb 4.8 oz (122.607 kg) Height:   (175.3 cm)  BEHAVIORAL SYMPTOMS/MOOD NEUROLOGICAL BOWEL NUTRITION STATUS   (none)  (none) Continent Diet (renal )  AMBULATORY STATUS COMMUNICATION OF NEEDS Skin   Limited Assist Verbally Normal                       Personal Care Assistance Level of Assistance  Bathing, Dressing Bathing Assistance: Limited assistance   Dressing Assistance: Limited assistance Total Care Assistance: Limited assistance   Functional Limitations Info    Sight Info: Adequate Hearing Info: Adequate Speech Info: Adequate     SPECIAL CARE FACTORS FREQUENCY   (Patient on peritoneal dialysis)                    Contractures Contractures Info: Not present    Additional Factors Info  Code Status Code Status Info: full             Current Medications (08/24/2015):  This is the current hospital active medication list Current Facility-Administered Medications  Medication Dose Route Frequency Provider Last Rate Last Dose  . 0.9 %  sodium chloride infusion   Intravenous Continuous Ihor Austin, MD 75 mL/hr at 08/24/15 0417    . 0.9 %  sodium chloride infusion   Intravenous Once Shaune Pollack, MD      . acetaminophen (TYLENOL) tablet 650 mg  650 mg Oral Q6H PRN Ihor Austin, MD       Or  . acetaminophen (TYLENOL) suppository 650 mg  650 mg Rectal Q6H PRN Pavan Pyreddy, MD      . albuterol (PROVENTIL) (2.5 MG/3ML) 0.083% nebulizer solution 3 mL  3 mL Inhalation Q6H PRN Ihor Austin, MD      . aspirin EC tablet 81 mg  81 mg Oral Daily Ihor Austin, MD   81 mg at 08/24/15 0943  . calcitRIOL (ROCALTROL) capsule 0.25 mcg  0.25 mcg Oral Q breakfast Ihor Austin, MD   0.25 mcg at 08/24/15 0942  . calcium acetate (PHOSLO) capsule 2,001 mg  2,001 mg Oral TID WC Ihor Austin, MD  2,001 mg at 08/24/15 0957  . clopidogrel (PLAVIX) tablet 75 mg  75 mg Oral Daily Ihor Austin, MD   75 mg at 08/24/15 0943  . dialysis solution 2.5% low-MG/low-CA dianeal solution   Intraperitoneal Q24H Munsoor Lateef, MD      . docusate sodium (COLACE) capsule 100 mg  100 mg Oral QHS PRN Ihor Austin, MD      . ezetimibe (ZETIA) tablet 10 mg  10 mg Oral QHS Pavan Pyreddy, MD       And  . simvastatin (ZOCOR) tablet 40 mg  40 mg Oral QHS Pavan Pyreddy, MD      . gentamicin cream (GARAMYCIN) 0.1 % 1 application  1 application Topical Daily Munsoor Lateef, MD      . heparin injection 5,000 Units  5,000 Units Subcutaneous Q8H Pavan Pyreddy, MD      . isosorbide mononitrate (IMDUR) 24 hr tablet 60 mg  60 mg Oral Daily Pavan Pyreddy, MD       . lactulose (CHRONULAC) 10 GM/15ML solution 10 g  10 g Oral Daily PRN Ihor Austin, MD      . Melene Muller ON 08/25/2015] levofloxacin (LEVAQUIN) IVPB 500 mg  500 mg Intravenous Q48H Pavan Pyreddy, MD      . nitroGLYCERIN (NITROSTAT) SL tablet 0.4 mg  0.4 mg Sublingual Q5 min PRN Pavan Pyreddy, MD      . nystatin (MYCOSTATIN) powder 1 Bottle  1 Bottle Topical BID Pavan Pyreddy, MD      . ondansetron (ZOFRAN) tablet 4 mg  4 mg Oral Q6H PRN Ihor Austin, MD       Or  . ondansetron (ZOFRAN) injection 4 mg  4 mg Intravenous Q6H PRN Pavan Pyreddy, MD      . oxyCODONE (Oxy IR/ROXICODONE) immediate release tablet 5 mg  5 mg Oral Q4H PRN Pavan Pyreddy, MD      . pantoprazole (PROTONIX) EC tablet 40 mg  40 mg Oral QAC breakfast Ihor Austin, MD   40 mg at 08/24/15 0943  . ranolazine (RANEXA) 12 hr tablet 500 mg  500 mg Oral BID Pavan Pyreddy, MD      . risperiDONE (RISPERDAL) tablet 1 mg  1 mg Oral QHS Pavan Pyreddy, MD      . senna (SENOKOT) tablet 17.2 mg  2 tablet Oral QHS PRN Pavan Pyreddy, MD      . sodium chloride flush (NS) 0.9 % injection 3 mL  3 mL Intravenous Q12H Pavan Pyreddy, MD      . zinc oxide 20 % ointment   Topical PRN Ihor Austin, MD         Discharge Medications: Please see discharge summary for a list of discharge medications.  Relevant Imaging Results:  Relevant Lab Results:   Additional Information SS: [id number]  York Spaniel, LCSW

## 2015-08-24 NOTE — Progress Notes (Signed)
[facility]  Patient Status: Active  [facility] is providing the following services: SN/PT/OT/HHA  If patient discharges after hours, please call [phone].   [name] 08/24/2015, 11:28 AM

## 2015-08-24 NOTE — Clinical Social Work Note (Signed)
Clinical Social Work Assessment  Patient Details  Name: Kimberly Montgomery MRN: [id number] Date of Birth: Dec 24, 1965  Date of referral:  08/24/15               Reason for consult:  Facility Placement                Permission sought to share information with:  Facility Medical sales representative, Family Supports Permission granted to share information::  Yes, Verbal Permission Granted  Name::        Agency::     Relationship::     Contact Information:     Housing/Transportation Living arrangements for the past 2 months:   [facility]) Source of Information:  Patient, Adult Children Patient Interpreter Needed:  None Criminal Activity/Legal Involvement Pertinent to Current Situation/Hospitalization:  No - Comment as needed Significant Relationships:  Adult Children Lives with:  Self, Adult Children Do you feel safe going back to the place where you live?    Need for family participation in patient care:  Yes (Comment)  Care giving concerns:  Patient resides with her daughter.   Social Worker assessment / plan:  PT being order to determine if STR is recommended. PT pending currently. Patient has medicare humana and her insurance will need to approve for patient to go to STR before patient can be transferred. Patient is peritoneal dialysis patient and the daughter called [facility] because they have had a family member there before that [facility] was agreeable to take who was on PD. [name] requested they consider patient. CSW to work up formal FL2 for [facility] to review however the admission's liason has verbally informed CSW that they will take patient with peritoneal dialysis as long as patient's daughter will do every peritoneal dialysis session. [name] has agreed to this with [facility]. [facility] will need to acquire auth from [facility] but it cannot be initiated until PT has their assessment in. Employment status:  Disabled (Comment on whether or not currently receiving  Disability) Insurance information:    PT Recommendations:  Skilled Nursing Facility Information / Referral to community resources:  Skilled Nursing Facility  Patient/Family's Response to care:  Patient and daughter expressed appreciation for CSW assistance.  Patient/Family's Understanding of and Emotional Response to Diagnosis, Current Treatment, and Prognosis:  Patient and daughter preferred patient to be on PD.   Emotional Assessment Appearance:  Appears stated age Attitude/Demeanor/Rapport:   (pleasant and cooperative) Affect (typically observed):  Calm Orientation:  Oriented to Self, Oriented to Place, Oriented to  Time, Oriented to Situation Alcohol / Substance use:  Not Applicable Psych involvement (Current and /or in the community):  No (Comment)  Discharge Needs  Concerns to be addressed:  Care Coordination Readmission within the last 30 days:  No Current discharge risk:  None Barriers to Discharge:  No Barriers Identified   York Spaniel, LCSW 08/24/2015, 1:33 PM

## 2015-08-24 NOTE — Progress Notes (Signed)
PD started 

## 2015-08-24 NOTE — Clinical Social Work Note (Signed)
[facility] has officially offered to take patient with PDialysis. They are beginning prior auth with medicare humana today but they will require a PT evaluation prior to making a decision. York Spaniel MSW,LCSW [phone]

## 2015-08-24 NOTE — Evaluation (Signed)
Physical Therapy Evaluation Patient Details Name: Kimberly Montgomery MRN: [id number] DOB: Jul 07, 1965 Today's Date: 08/24/2015   History of Present Illness  Patient is a 50 y/o female that presents with AMS, also found to have anemia, received 1 unit PRBC earlier today and cleared by RN to work with PT. Kimberly Montgomery has a lengthy medical history including recent dc from [facility], s/p cardiac cath, on peritoneal dialysis. History of seizures, R BKA.   Clinical Impression  Patient is a 50 y/o female with extensive medical history and co-morbidities. 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 She is largely waxing/waning in this session due to medications she had prior to admission. She is unable to stand with PT and it appears that she was unable to perform any mobility at home. Given her clinical picture, it is recommended she go to a SNF after discharge and have prosthetic re-sized (acute setting is inappropriate for this due to lack of specialization/equipment).     Follow Up Recommendations SNF    Equipment Recommendations       Recommendations for Other Services       Precautions / Restrictions Precautions Precautions: Fall Restrictions Weight Bearing Restrictions: No      Mobility  Bed Mobility Overal bed mobility: Modified Independent Bed Mobility: Supine to Sit;Sit to Supine     Supine to sit: Modified independent (Device/Increase time) Sit to supine: Modified independent (Device/Increase time)   General bed mobility comments: Patient uses bed railings with prolonged time to complete bed mobility, requires PT assistance to scoot up in bed.   Transfers Overall transfer level: Needs assistance Equipment used: Rolling walker (2 wheeled) Transfers: Sit to/from Stand Sit to Stand: Max assist         General transfer comment: PT unable to assist patient to  standing with max A x 1 and RW with elevated bed surface on 2 attempts.   Ambulation/Gait                Stairs            Wheelchair Mobility    Modified Rankin (Stroke Patients Only)       Balance Overall balance assessment: Needs assistance Sitting-balance support: Bilateral upper extremity supported Sitting balance-Leahy Scale: Good                                       Pertinent Vitals/Pain Pain Assessment: Faces Faces Pain Scale: Hurts whole lot Pain Location: Bilateral thighs, reports abdominal pain as well Pain Descriptors / Indicators: Aching Pain Intervention(s): Limited activity within patient's tolerance;Monitored during session;Repositioned;Patient requesting pain meds-RN notified    Home Living Family/patient expects to be discharged to:: Skilled nursing facility Living Arrangements: Alone Available Help at Discharge: Family Type of Home: House Home Access: Stairs to enter Entrance Stairs-Rails: Right Entrance Stairs-Number of Steps: 3 Home Layout: One level Home Equipment: Walker - 4 wheels;Cane - single point Additional Comments: Pts daugther available occasionally to help with ADLs.     Prior Function           Comments: Prior to this most recent admission patient reports she has been transferring to [facility] otherwise sedentary and unable to negotiate her household.      Hand Dominance  Extremity/Trunk Assessment   Upper Extremity Assessment: Difficult to assess due to impaired cognition           Lower Extremity Assessment: Difficult to assess due to impaired cognition         Communication   Communication: No difficulties  Cognition Arousal/Alertness: Suspect due to medications Behavior During Therapy: Flat affect Overall Cognitive Status: No family/caregiver present to determine baseline cognitive functioning (She apparently has been in/out of awareness since she came to [facility] as she took excessive  amount of percocet prior to admission. )                      General Comments      Exercises        Assessment/Plan    PT Assessment Patient needs continued PT services  PT Diagnosis Difficulty walking;Generalized weakness;Altered mental status   PT Problem List Decreased strength;Decreased mobility;Decreased knowledge of use of DME;Decreased activity tolerance;Decreased balance  PT Treatment Interventions DME instruction;Gait training;Stair training;Therapeutic activities;Therapeutic exercise;Balance training   PT Goals (Current goals can be found in the Care Plan section) Acute Rehab PT Goals Patient Stated Goal: To be more independent with mobility.  PT Goal Formulation: With patient Time For Goal Achievement: 09/07/15 Potential to Achieve Goals: Fair    Frequency Min 2X/week   Barriers to discharge Decreased caregiver support;Inaccessible home environment Patient is no longer mobile without prosthetic and unable to self manage at home.     Co-evaluation               End of Session Equipment Utilized During Treatment: Gait belt Activity Tolerance: Patient limited by lethargy Patient left: in bed;with call bell/phone within reach;with bed alarm set Nurse Communication: Mobility status;Patient requests pain meds         Time: 1740-1800 PT Time Calculation (min) (ACUTE ONLY): 20 min   Charges:   PT Evaluation $PT Eval High Complexity: 1 Procedure     PT G Codes:       Kimberly Montgomery, PT, DPT    08/24/2015, 6:14 PM

## 2015-08-25 ENCOUNTER — Encounter
Admission: EM | Disposition: A | Discharge status: Skilled Nursing Facility | Payer: Self-pay | Source: Home / Self Care | Attending: Internal Medicine

## 2015-08-25 LAB — CBC
HEMATOCRIT: 23.4 % — AB (ref 35.0–47.0)
Hemoglobin: 7.7 g/dL — ABNORMAL LOW (ref 12.0–16.0)
MCH: 28.9 pg (ref 26.0–34.0)
MCHC: 33 g/dL (ref 32.0–36.0)
MCV: 87.6 fL (ref 80.0–100.0)
Platelets: 461 10*3/uL — ABNORMAL HIGH (ref 150–440)
RBC: 2.67 MIL/uL — ABNORMAL LOW (ref 3.80–5.20)
RDW: 14.8 % — AB (ref 11.5–14.5)
WBC: 13.7 10*3/uL — AB (ref 3.6–11.0)

## 2015-08-25 LAB — GLUCOSE, CAPILLARY
GLUCOSE-CAPILLARY: 137 mg/dL — AB (ref 65–99)
GLUCOSE-CAPILLARY: 145 mg/dL — AB (ref 65–99)
Glucose-Capillary: 139 mg/dL — ABNORMAL HIGH (ref 65–99)
Glucose-Capillary: 120 mg/dL — ABNORMAL HIGH (ref 65–99)

## 2015-08-25 LAB — BODY FLUID CELL COUNT WITH DIFFERENTIAL
Eos, Fluid: 1 %
LYMPHS FL: 10 %
Monocyte-Macrophage-Serous Fluid: 84 %
Neutrophil Count, Fluid: 5 %
Other Cells, Fluid: 0 %
Total Nucleated Cell Count, Fluid: 109 cu mm

## 2015-08-25 SURGERY — DIALYSIS/PERMA CATHETER INSERTION
Anesthesia: Moderate Sedation

## 2015-08-25 MED ORDER — CHLORHEXIDINE GLUCONATE CLOTH 2 % EX PADS
6.0000 | MEDICATED_PAD | Freq: Once | CUTANEOUS | Status: AC
  Administered 2015-08-25: 6 via TOPICAL

## 2015-08-25 MED ORDER — ERYTHROMYCIN BASE 250 MG PO TABS
250.0000 mg | ORAL_TABLET | Freq: Three times a day (TID) | ORAL | Status: DC
  Administered 2015-08-25 – 2015-08-31 (×21): 250 mg via ORAL
  Filled 2015-08-25 (×25): qty 1

## 2015-08-25 MED ORDER — SODIUM CHLORIDE 0.9 % IV SOLN
INTRAVENOUS | Status: DC
Start: 2015-08-25 — End: 2015-08-30
  Administered 2015-08-25 – 2015-08-27 (×2): via INTRAVENOUS

## 2015-08-25 MED ORDER — NICOTINE 14 MG/24HR TD PT24
14.0000 mg | MEDICATED_PATCH | Freq: Every day | TRANSDERMAL | Status: DC
  Administered 2015-08-25 – 2015-08-31 (×7): 14 mg via TRANSDERMAL
  Filled 2015-08-25 (×7): qty 1

## 2015-08-25 NOTE — Progress Notes (Signed)
Pharmacy technician was able to clarify that patient is not taking simvastatin prior to admission. Spoke with MD on call regarding contraindication between simvastatin and erythromycin. Discontinued simvastatin per discussion.  Cindi Carbon, PharmD 7:47 PM

## 2015-08-25 NOTE — Clinical Documentation Improvement (Signed)
Internal Medicine  Can the diagnosis of CHF be further specified?    Acuity - Acute, Chronic, Acute on Chronic   Type - Systolic, Diastolic, Systolic and Diastolic  Other  Clinically Undetermined  Document any associated diagnoses/conditions Please update your documentation within the medical record to reflect your response to this query. Thank you.  Supporting Information:  "CHF"  Please exercise your independent, professional judgment when responding. A specific answer is not anticipated or expected.  Thank You, Nevin Bloodgood, RN, BSN, CCDS,Clinical Documentation Specialist:  [phone]  [phone]=[facility]- Health Information Management

## 2015-08-25 NOTE — Consult Note (Signed)
[facility] Vascular Consult Note  MRN : [id number]  Kimberly Montgomery is a 50 y.o. (1965/12/13) female who presents with chief complaint of  Chief Complaint  Patient presents with  . Altered Mental Status  .  History of Present Illness: I am asked to see the patient by Dr. Cherylann Ratel in nephrology for hemodialysis access placement. The patient is a 50 year old female with a litany of medical issues as described below. She has end-stage renal disease and is on dialysis. She had been doing peritoneal dialysis, but now has apparent infection and peritoneal dialysis is clearly not going to be a long-term option for her. The nephrologists are now planning to switch her over to hemodialysis. She is confused and provides very little history of current and this is obtained mostly from the previous medical record. Immediate dialysis access is requested and the nephrology service would like a dialysis catheter placed this afternoon.  Current Facility-Administered Medications  Medication Dose Route Frequency Provider Last Rate Last Dose  . 0.9 %  sodium chloride infusion   Intravenous Continuous Annice Needy, MD 20 mL/hr at 08/25/15 1751    . acetaminophen (TYLENOL) tablet 650 mg  650 mg Oral Q6H PRN Ihor Austin, MD   650 mg at 08/24/15 2153   Or  . acetaminophen (TYLENOL) suppository 650 mg  650 mg Rectal Q6H PRN Pavan Pyreddy, MD      . albuterol (PROVENTIL) (2.5 MG/3ML) 0.083% nebulizer solution 3 mL  3 mL Inhalation Q6H PRN Pavan Pyreddy, MD      . aspirin EC tablet 81 mg  81 mg Oral Daily Pavan Pyreddy, MD   81 mg at 08/25/15 1113  . calcitRIOL (ROCALTROL) capsule 0.25 mcg  0.25 mcg Oral Q breakfast Ihor Austin, MD   0.25 mcg at 08/25/15 1191  . calcium acetate (PHOSLO) capsule 2,001 mg  2,001 mg Oral TID WC Ihor Austin, MD   2,001 mg at 08/25/15 1718  . clopidogrel (PLAVIX) tablet 75 mg  75 mg Oral Daily Ihor Austin, MD   75 mg at 08/25/15 1114  . dialysis solution 2.5%  low-MG/low-CA dianeal solution   Intraperitoneal Q24H Munsoor Lateef, MD      . docusate sodium (COLACE) capsule 100 mg  100 mg Oral QHS PRN Ihor Austin, MD      . erythromycin (E-MYCIN) tablet 250 mg  250 mg Oral TID WC & HS Munsoor Lateef, MD   250 mg at 08/25/15 1719  . simvastatin (ZOCOR) tablet 20 mg  20 mg Oral QHS Shaune Pollack, MD   20 mg at 08/24/15 2154   And  . ezetimibe (ZETIA) tablet 10 mg  10 mg Oral QHS Shaune Pollack, MD   10 mg at 08/24/15 2154  . gentamicin cream (GARAMYCIN) 0.1 % 1 application  1 application Topical Daily Munsoor Lateef, MD   1 application at 08/25/15 1115  . heparin injection 5,000 Units  5,000 Units Subcutaneous Q8H Ihor Austin, MD   5,000 Units at 08/25/15 1504  . insulin aspart (novoLOG) injection 0-5 Units  0-5 Units Subcutaneous QHS Shaune Pollack, MD   0 Units at 08/24/15 2149  . insulin aspart (novoLOG) injection 0-9 Units  0-9 Units Subcutaneous TID WC Shaune Pollack, MD   1 Units at 08/25/15 1719  . lactulose (CHRONULAC) 10 GM/15ML solution 10 g  10 g Oral Daily PRN Pavan Pyreddy, MD      . levofloxacin (LEVAQUIN) IVPB 500 mg  500 mg Intravenous Q48H Ihor Austin, MD  500 mg at 08/25/15 1810  . nicotine (NICODERM CQ - dosed in mg/24 hours) patch 14 mg  14 mg Transdermal Daily Shaune Pollack, MD   14 mg at 08/25/15 1641  . nitroGLYCERIN (NITROSTAT) SL tablet 0.4 mg  0.4 mg Sublingual Q5 min PRN Pavan Pyreddy, MD      . nystatin (MYCOSTATIN) powder 1 Bottle  1 Bottle Topical BID Ihor Austin, MD   1 Bottle at 08/25/15 1115  . ondansetron (ZOFRAN) tablet 4 mg  4 mg Oral Q6H PRN Ihor Austin, MD       Or  . ondansetron (ZOFRAN) injection 4 mg  4 mg Intravenous Q6H PRN Pavan Pyreddy, MD      . oxyCODONE (Oxy IR/ROXICODONE) immediate release tablet 5 mg  5 mg Oral Q4H PRN Ihor Austin, MD   5 mg at 08/25/15 0750  . pantoprazole (PROTONIX) EC tablet 40 mg  40 mg Oral QAC breakfast Ihor Austin, MD   40 mg at 08/25/15 1275  . ranolazine (RANEXA) 12 hr tablet 500 mg  500 mg  Oral BID Ihor Austin, MD   500 mg at 08/25/15 1114  . risperiDONE (RISPERDAL) tablet 1 mg  1 mg Oral QHS Ihor Austin, MD   1 mg at 08/24/15 2154  . senna (SENOKOT) tablet 17.2 mg  2 tablet Oral QHS PRN Pavan Pyreddy, MD      . sodium chloride flush (NS) 0.9 % injection 3 mL  3 mL Intravenous Q12H Pavan Pyreddy, MD   3 mL at 08/25/15 1114  . zinc oxide 20 % ointment   Topical PRN Ihor Austin, MD        Past Medical History  Diagnosis Date  . Renal disorder   . Hypertension   . CHF (congestive heart failure) (HCC)   . Diabetes mellitus without complication (HCC)   . Gastroparesis   . Asthma   . ESRD (end stage renal disease) (HCC)   . Endometriosis   . CAD (coronary artery disease)   . HLD (hyperlipidemia)   . GERD (gastroesophageal reflux disease)     Past Surgical History  Procedure Laterality Date  . Hysterotomy    . Below knee leg amputation    . Pd tube inserstion    . Hd fistula surgery with reversal    . Cardiac catheterization Right 07/10/2015    Procedure: Left Heart Cath and Coronary Angiography;  Surgeon: Laurier Nancy, MD;  Location: [facility] INVASIVE CV LAB;  Service: Cardiovascular;  Laterality: Right;    Social History Social History  Substance Use Topics  . Smoking status: Current Every Day Smoker -- 0.50 packs/day    Types: Cigarettes  . Smokeless tobacco: None  . Alcohol Use: No  No IVDU  Family History Family History  Problem Relation Age of Onset  . CAD    . Diabetes    . Bipolar disorder    . Cervical cancer Mother   No bleeding disorders or clotting disorders  Allergies  Allergen Reactions  . Cephalosporins Anaphylaxis  . Penicillins Anaphylaxis and Other (See Comments)    Has patient had a PCN reaction causing immediate rash, facial/tongue/throat swelling, SOB or lightheadedness with hypotension: Yes Has patient had a PCN reaction causing severe rash involving mucus membranes or skin necrosis: No Has patient had a PCN reaction that  required hospitalization No Has patient had a PCN reaction occurring within the last 10 years: No If all of the above answers are "NO", then may proceed with Cephalosporin use.  [name]  Lamictal [Lamotrigine] Other (See Comments)    Reaction:  Hallucinations  . Phenergan [Promethazine Hcl] Nausea And Vomiting  . Pravastatin Other (See Comments)    Reaction:  Muscle pain   . Sulfa Antibiotics Other (See Comments)    Reaction:  Unknown      REVIEW OF SYSTEMS (Negative unless checked)  Constitutional: [] Weight loss  [x] Fever  [] Chills Cardiac: [] Chest pain   [] Chest pressure   [] Palpitations   [] Shortness of breath when laying flat   [] Shortness of breath at rest   [] Shortness of breath with exertion. Vascular:  [] Pain in legs with walking   [] Pain in legs at rest   [] Pain in legs when laying flat   [] Claudication   [] Pain in feet when walking  [] Pain in feet at rest  [] Pain in feet when laying flat   [] History of DVT   [] Phlebitis   [] Swelling in legs   [] Varicose veins   [] Non-healing ulcers Pulmonary:   [] Uses home oxygen   [] Productive cough   [] Hemoptysis   [] Wheeze  [] COPD   [] Asthma Neurologic:  [] Dizziness  [] Blackouts   [] Seizures   [] History of stroke   [] History of TIA  [] Aphasia   [] Temporary blindness   [] Dysphagia   [] Weakness or numbness in arms   [] Weakness or numbness in legs Musculoskeletal:  [] Arthritis   [] Joint swelling   [] Joint pain   [] Low back pain Hematologic:  [] Easy bruising  [] Easy bleeding   [] Hypercoagulable state   [] Anemic  [] Hepatitis Gastrointestinal:  [] Blood in stool   [] Vomiting blood  [] Gastroesophageal reflux/heartburn   [] Difficulty swallowing. Genitourinary:  [x] Chronic kidney disease   [] Difficult urination  [] Frequent urination  [] Burning with urination   [] Blood in urine Skin:  [] Rashes   [] Ulcers   [x] Wounds Psychological:  [] History of anxiety   []  History of major depression.  Physical Examination  Filed Vitals:   08/24/15 2037 08/25/15 0439  08/25/15 0500 08/25/15 1300  BP: 128/68 153/69  132/71  Pulse: 110 116  111  Temp: 98.6 F (37 C) 98 F (36.7 C)  98.3 F (36.8 C)  TempSrc: Oral Oral  Oral  Resp: 18 20  19   Height:      Weight:   122.698 kg (270 lb 8 oz)   SpO2: 95% 100%  99%   Body mass index is 39.93 kg/(m^2). Gen:  WD/WN, NAD Head: [place]/AT, No temporalis wasting. Prominent temp pulse not noted. Ear/Nose/Throat: Hearing grossly intact, nares w/o erythema or drainage, oropharynx w/o Erythema/Exudate Eyes: PERRLA, EOMI.  Neck: Supple, no nuchal rigidity.  No JVD.  Pulmonary:  Good air movement, equal bilaterally.  Cardiac: RRR, normal S1, S2 Vascular: right BKA well healed Vessel Right Left  Radial Palpable Palpable                                   Gastrointestinal: soft, non-tender/non-distended. No guarding/reflex. PD catheter with mild surrounding erythema Musculoskeletal: M/S 5/5 throughout.  Right BKA. No edema. Neurologic: CN 2-12 intact. Pain and light touch intact in extremities.  Symmetrical.  Speech is fluent. Motor exam as listed above. Psychiatric: somewhat confused and lethargic, but responds reasonably appropriately Dermatologic: No rashes or ulcers noted.  No cellulitis or open wounds. Lymph : No Cervical, Axillary, or Inguinal lymphadenopathy.      CBC Lab Results  Component Value Date   WBC 13.7* 08/25/2015   HGB 7.7* 08/25/2015   HCT 23.4* 08/25/2015  MCV 87.6 08/25/2015   PLT 461* 08/25/2015    BMET    Component Value Date/Time   NA 138 08/24/2015 0452   NA 136 07/17/2014 0417   K 3.9 08/24/2015 0452   K 3.5 07/17/2014 0417   CL 98* 08/24/2015 0452   CL 103 07/17/2014 0417   CO2 25 08/24/2015 0452   CO2 26 07/17/2014 0417   GLUCOSE 183* 08/24/2015 0452   GLUCOSE 221* 07/17/2014 0417   BUN 74* 08/24/2015 0452   BUN 32* 07/17/2014 0417   CREATININE 9.81* 08/24/2015 0452   CREATININE 3.45* 07/17/2014 0417   CALCIUM 7.8* 08/24/2015 0452   CALCIUM 8.4* 07/17/2014  0417   GFRNONAA 4* 08/24/2015 0452   GFRNONAA 15* 07/17/2014 0417   GFRNONAA 20* 01/02/2014 0435   GFRAA 5* 08/24/2015 0452   GFRAA 17* 07/17/2014 0417   GFRAA 24* 01/02/2014 0435   Estimated Creatinine Clearance: 9.5 mL/min (by C-G formula based on Cr of 9.81).  COAG Lab Results  Component Value Date   INR 0.9 10/26/2013    Radiology Dg Abd 1 View  07/31/2015  CLINICAL DATA:  Nausea and vomiting EXAM: ABDOMEN - 1 VIEW COMPARISON:  07/27/2015 FINDINGS: Scattered large and small bowel gas is noted. Fecal material is noted throughout colon similar to that seen on the prior exam. Degenerative changes of the lumbar spine are seen. Dialysis catheter is noted within the low pelvis. No other focal abnormality is seen. Nasogastric catheter is been removed. IMPRESSION: No significant change from the prior exam. Electronically Signed   By: Alcide Clever M.D.   On: 07/31/2015 12:07   Dg Abd 1 View  07/27/2015  CLINICAL DATA:  NG tube placement, ileus, abd pain, pt admitted 8 days ago for n/v EXAM: ABDOMEN - 1 VIEW COMPARISON:  07/24/2015 FINDINGS: Nasogastric tube tip projects in the right upper quadrant consistent with a in the distal stomach. Stomach appears decompressed. Moderate colonic stool burden evident in the right abdomen. No evidence of bowel obstruction. The IMPRESSION: Nasogastric tube well positioned. Tip projects in the distal stomach Electronically Signed   By: Amie Portland M.D.   On: 07/27/2015 14:00   Dg Chest Portable 1 View  08/23/2015  CLINICAL DATA:  Altered mental status.  Anterior chest pain 2 days. EXAM: PORTABLE CHEST 1 VIEW COMPARISON:  07/09/2015 and 05/12/2015 FINDINGS: Lungs are adequately inflated without consolidation or effusion. Cardiomediastinal silhouette and remainder of the exam is unchanged. IMPRESSION: No active disease. Electronically Signed   By: Elberta Fortis M.D.   On: 08/23/2015 22:38      Assessment/Plan 1. Renal failure with failure of peritoneal  dialysis. Will likely need to transition over to hemodialysis. Temporary dialysis catheter be placed for immediate hemodialysis access options. Can plan a PermCath early next week. 2. Status post right below-knee amputation. Well healed. 3. Diabetes. Stable. Optimal glucose control important in reducing progression of atherosclerotic disease.   Meryn Sarracino, MD  08/25/2015 6:33 PM

## 2015-08-25 NOTE — Progress Notes (Signed)
Initial Nutrition Assessment  DOCUMENTATION CODES:   Obesity unspecified  INTERVENTION:  -Monitor intake and cater to pt preferences with diet restriction.   NUTRITION DIAGNOSIS:    (none at this time) related to   as evidenced by  .   GOAL:   Patient will meet greater than or equal to 90% of their needs    MONITOR:   PO intake, Labs  REASON FOR ASSESSMENT:   Malnutrition Screening Tool    ASSESSMENT:      Pt admitted with peritonitis, AMS.  Noted over past 2 months pt has spent approximately 40 days in the hospital due to peritonitis.  Noted planning to switch to HD.   Past Medical History  Diagnosis Date  . Renal disorder   . Hypertension   . CHF (congestive heart failure) (HCC)   . Diabetes mellitus without complication (HCC)   . Gastroparesis   . Asthma   . ESRD (end stage renal disease) (HCC)   . Endometriosis   . CAD (coronary artery disease)   . HLD (hyperlipidemia)   . GERD (gastroesophageal reflux disease)    Unable to answer question regarding intake prior to admission.  Reports "I have been craving sweets." Tried to clarify if intake has been decreased prior to admission but pt unable to answer.  Noted ate 100% of pork chop for lunch, 50% of salad, 90% of grapes  Per I and O sheet eating 100% of meals  Medications reviewed: phoslo, aspart, nystatin, protonix, lactulose, senna, erythromycin Labs reviewed: BUN 74, creatinine 9.81, glucose 183  Nutrition-Focused physical exam completed. Findings are WDL for fat depletion, muscle depletion, and unable to assess edema    Diet Order:  Diet renal/carb modified with fluid restriction Diet-HS Snack?: Nothing; Room service appropriate?: Yes; Fluid consistency:: Thin  Skin:  Reviewed, no issues  Last BM:  5/25  Height:   Ht Readings from Last 1 Encounters:  08/24/15 5\' 9"  (1.753 m)    Weight: wt encounters reviewed  Wt Readings from Last 1 Encounters:  08/25/15 270 lb 8 oz (122.698 kg)   Wt  Readings from Last 10 Encounters:  08/25/15 270 lb 8 oz (122.698 kg)  08/02/15 270 lb 3.2 oz (122.562 kg)  07/12/15 270 lb 12.8 oz (122.834 kg)  05/16/15 257 lb 8 oz (116.801 kg)     Ideal Body Weight:     BMI:  Body mass index is 39.93 kg/(m^2).  Estimated Nutritional Needs:   Kcal:  1899 kcals/d  Protein:  95 g/d  Fluid:  + UOP  EDUCATION NEEDS:   Education needs no appropriate at this time  Jaysten Essner B. Freida Busman, RD, LDN [phone] (pager) Weekend/On-Call pager [phone])

## 2015-08-25 NOTE — Op Note (Signed)
  OPERATIVE NOTE   PROCEDURE: 1. Ultrasound guidance for vascular access right femoral vein 2. Placement of a 30 cm triple-lumen dialysis catheter right femoral vein  PRE-OPERATIVE DIAGNOSIS: 1. Renal failure with failure of peritoneal dialysis and need to switch over to hemodialysis  POST-OPERATIVE DIAGNOSIS: Same  SURGEON: Festus Barren, MD  ASSISTANT(S): None  ANESTHESIA: local  ESTIMATED BLOOD LOSS: Minimal   FINDING(S): 1. None  SPECIMEN(S): None  INDICATIONS:  Patient is a 50 y.o.female who presents with  renal failure and failure of peritoneal dialysis. A temporary dialysis catheter has been requested by the nephrology service.  Risks and benefits were discussed, and informed consent was obtained..  DESCRIPTION: After obtaining full informed written consent, the patient was laid flat in the bed. The right groin was sterilely prepped and draped in a sterile surgical field was created. The right femoral vein was visualized with ultrasound and found to be widely patent. It was then accessed under direct guidance without difficulty with a Seldinger needle and a permanent image was recorded. A J-wire was then placed. After skin nick and dilatation, a trialysis type dialysis catheter was placed over the wire and the wire was removed. The lumens withdrew dark red nonpulsatile blood and flushed easily with sterile saline. The catheter was secured to the skin with 3 nylon sutures. Sterile dressing was placed.  COMPLICATIONS: None  CONDITION: Stable  DEW,JASON 08/25/2015 6:37 PM

## 2015-08-25 NOTE — Progress Notes (Signed)
Pharmacy Antibiotic Note  Kimberly Montgomery is a 50 y.o. female admitted on 08/23/2015 with systemic inflammatory response syndrome.  Pharmacy has been consulted for levofloxacin and vancomycin dosing. Note: patient is on peritoneal dialysis   Plan: 1. Levofloxacin 750 mg IV x 1 in ED followed by levofloxacin 500 mg IV Q48H. 2. Vancomycin 1 gm IV x 1 in ED. Give additional 1 gm IV x 1. Will dose intravenously and based off levels at this point.  Of note, nephrology considering transition to IHD. Please follow and adjust if change is made.   Height: 5\' 9"  (175.3 cm) Weight: 270 lb 8 oz (122.698 kg) IBW/kg (Calculated) : 66.2  Temp (24hrs), Avg:98.5 F (36.9 C), Min:98 F (36.7 C), Max:98.9 F (37.2 C)   Recent Labs Lab 08/23/15 2319 08/24/15 0452 08/25/15 0330  WBC 15.2* 12.9* 13.7*  CREATININE 10.03* 9.81*  --   LATICACIDVEN 1.0  --   --     Estimated Creatinine Clearance: 9.5 mL/min (by C-G formula based on Cr of 9.81).    Allergies  Allergen Reactions  . Cephalosporins Anaphylaxis  . Penicillins Anaphylaxis and Other (See Comments)    Has patient had a PCN reaction causing immediate rash, facial/tongue/throat swelling, SOB or lightheadedness with hypotension: Yes Has patient had a PCN reaction causing severe rash involving mucus membranes or skin necrosis: No Has patient had a PCN reaction that required hospitalization No Has patient had a PCN reaction occurring within the last 10 years: No If all of the above answers are "NO", then may proceed with Cephalosporin use.  [name] Phenergan [Promethazine Hcl] Nausea And Vomiting    Thank you for allowing pharmacy to be a part of this patient's care.  Jasmin Trumbull C, Pharm.D., Clinical Pharmacist 08/25/2015 3:42 PM

## 2015-08-25 NOTE — Progress Notes (Addendum)
[facility] -  at [facility]   PATIENT NAME: Kimberly Montgomery    MR#:  [id number]  DATE OF BIRTH:  12/30/1965  SUBJECTIVE:  CHIEF COMPLAINT:   Chief Complaint  Patient presents with  . Altered Mental Status   Weakness. REVIEW OF SYSTEMS:  CONSTITUTIONAL: No fever, Has generalized weakness.  EYES: No blurred or double vision.  EARS, NOSE, AND THROAT: No tinnitus or ear pain.  RESPIRATORY: No cough, shortness of breath, wheezing or hemoptysis.  CARDIOVASCULAR: No chest pain, orthopnea, edema.  GASTROINTESTINAL: No nausea, vomiting, diarrhea or abdominal pain. No melena or bloody stool. GENITOURINARY: No dysuria, hematuria.  ENDOCRINE: No polyuria, nocturia,  HEMATOLOGY: No anemia, easy bruising or bleeding SKIN: No rash or lesion. MUSCULOSKELETAL: No joint pain or arthritis.   NEUROLOGIC: No tingling, numbness, weakness.  PSYCHIATRY: No anxiety or depression.   DRUG ALLERGIES:   Allergies  Allergen Reactions  . Cephalosporins Anaphylaxis  . Penicillins Anaphylaxis and Other (See Comments)    Has patient had a PCN reaction causing immediate rash, facial/tongue/throat swelling, SOB or lightheadedness with hypotension: Yes Has patient had a PCN reaction causing severe rash involving mucus membranes or skin necrosis: No Has patient had a PCN reaction that required hospitalization No Has patient had a PCN reaction occurring within the last 10 years: No If all of the above answers are "NO", then may proceed with Cephalosporin use.  [name] Phenergan [Promethazine Hcl] Nausea And Vomiting    VITALS:  Blood pressure 132/71, pulse 111, temperature 98.3 F (36.8 C), temperature source Oral, resp. rate 19, height  (1.753 m), weight 270 lb 8 oz (122.698 kg), last menstrual period 03/16/2015, SpO2 99 %.  PHYSICAL EXAMINATION:  GENERAL:  49 y.o.-year-old patient lying in the bed with no acute distress. Morbid obese. EYES: Pupils equal, round, reactive to  light and accommodation. No scleral icterus. Extraocular muscles intact.  HEENT: Head atraumatic, normocephalic. Oropharynx and nasopharynx clear.  NECK:  Supple, no jugular venous distention. No thyroid enlargement, no tenderness.  LUNGS: Normal breath sounds bilaterally, no wheezing, rales,rhonchi or crepitation. No use of accessory muscles of respiration.  CARDIOVASCULAR: S1, S2 normal. No murmurs, rubs, or gallops.  ABDOMEN: Soft, nontender, nondistended. Bowel sounds present. No organomegaly or mass.  EXTREMITIES: No pedal edema, cyanosis, or clubbing. Right BKA. NEUROLOGIC: Cranial nerves II through XII are intact. Muscle strength 5/5 in all extremities. Sensation intact. Gait not checked.  PSYCHIATRIC: The patient is alert and oriented x 3.  SKIN: No obvious rash, lesion, or ulcer.    LABORATORY PANEL:   CBC  Recent Labs Lab 08/25/15 0330  WBC 13.7*  HGB 7.7*  HCT 23.4*  PLT 461*   ------------------------------------------------------------------------------------------------------------------  Chemistries   Recent Labs Lab 08/23/15 2319 08/24/15 0452  NA 138 138  K 4.3 3.9  CL 96* 98*  CO2 26 25  GLUCOSE 150* 183*  BUN 75* 74*  CREATININE 10.03* 9.81*  CALCIUM 8.3* 7.8*  AST 13*  --   ALT 7*  --   ALKPHOS 105  --   BILITOT 0.6  --    ------------------------------------------------------------------------------------------------------------------  Cardiac Enzymes No results for input(s): TROPONINI in the last 168 hours. ------------------------------------------------------------------------------------------------------------------  RADIOLOGY:  Dg Chest Portable 1 View  08/23/2015  CLINICAL DATA:  Altered mental status.  Anterior chest pain 2 days. EXAM: PORTABLE CHEST 1 VIEW COMPARISON:  07/09/2015 and 05/12/2015 FINDINGS: Lungs are adequately inflated without consolidation or effusion. Cardiomediastinal silhouette and remainder of the exam is  unchanged. IMPRESSION:  No active disease. Electronically Signed   By: Elberta Fortis M.D.   On: 08/23/2015 22:38    EKG:   Orders placed or performed during the hospital encounter of 08/23/15  . ED EKG  . ED EKG  . EKG 12-Lead  . EKG 12-Lead    ASSESSMENT AND PLAN:   50 year old female patient with history of end-stage renal disease on peritoneal dialysis, hypertension, anemia, type 2 diabetes mellitus, congestive heart failure presented to the emergency room with lethargy confusion.  1. Acute encephalopathy, possible due to percocet. Improved.  2. Sepsis, rule out peritonitis.  Continue IV vancomycin and IV Levaquin, Follow-up CBC, blood cultures and peritoneal fluid analysis and cultures.  3. End-stage renal disease on peritoneal dialysis.  Start HD today per Dr. Cherylann Ratel.  4. Hypertension. Low side BP,  imdur and torsemide are on hold.  5.  Anemia of chronic disease. No active bleeding. Hb down to from 6.9 to 6.1. Given 1 units PRBC transfusion. Hb 7.7 today.  F/u Hb in am.  DM. On sliding scale.  Tobacco abuse. Smoking cessation was counseled for 3 minutes. Nicotine patch.   PT: SNF.  I discussed with Dr. Cherylann Ratel. All the records are reviewed and case discussed with Care Management/Social Workerr. Management plans discussed with the patient, family and they are in agreement.  CODE STATUS: full code.  TOTAL TIME TAKING CARE OF THIS PATIENT:36 minutes.  Greater than 50% time was spent on coordination of care and face-to-face counseling.  POSSIBLE D/C IN 2 DAYS, DEPENDING ON CLINICAL CONDITION.   Shaune Pollack M.D on 08/25/2015 at 2:55 PM  Between 7am to 6pm - Pager - [phone]  After 6pm go to [email or web address] - password [email or web address] [facility]  [place]  Office  [phone]  CC: Primary care physician; Jarome Matin, MD

## 2015-08-25 NOTE — Progress Notes (Signed)
Accessed PD catheter to obtain effluent sample for lab test.  Exit site care performed. Gauze applied and secured with paper tape.

## 2015-08-25 NOTE — Progress Notes (Signed)
Pd Completed

## 2015-08-25 NOTE — Progress Notes (Signed)
[facility]  ROUNDING NOTE   Subjective:  Patient seen at bedside. We'll long discussion with the patient regarding the potential to switch to hemodialysis. I also discussed this with the patient's daughter as well as her primary nephrologist. Patient doesn't fully comprehend what has transpired recently in terms of 2 recent peritonitis episodes.   Objective:  Vital signs in last 24 hours:  Temp:  [98 F (36.7 C)-98.9 F (37.2 C)] 98 F (36.7 C) (05/26 0439) Pulse Rate:  [97-116] 116 (05/26 0439) Resp:  [16-20] 20 (05/26 0439) BP: (108-153)/(52-69) 153/69 mmHg (05/26 0439) SpO2:  [95 %-100 %] 100 % (05/26 0439) Weight:  [122.698 kg (270 lb 8 oz)] 122.698 kg (270 lb 8 oz) (05/26 0500)  Weight change: -3.856 kg (-8 lb 8 oz) Filed Weights   08/23/15 2201 08/24/15 0402 08/25/15 0500  Weight: 126.554 kg (279 lb) 122.607 kg (270 lb 4.8 oz) 122.698 kg (270 lb 8 oz)    Intake/Output: I/O last 3 completed shifts: In: 1180 [P.O.:820; Blood:360] Out: 1025 [Urine:1025]   Intake/Output this shift:  Total I/O In: 0  Out: 762 [Other:762]  Physical Exam: General: NAD, resting in bed  Head: Normocephalic, atraumatic. Moist oral mucosal membranes  Eyes: Anicteric  Neck: Supple, trachea midline  Lungs:  Clear to auscultation  Heart: Regular rate and rhythm no rubs  Abdomen:  Soft, nontender, BS present  Extremities: 1+ LLE edema, R BKA  Neurologic: Awake, having difficulty concentrating, moving all four extremities  Skin: No lesions  Access: PD catheter in place, no drainage    Basic Metabolic Panel:  Recent Labs Lab 08/23/15 2319 08/24/15 0452  NA 138 138  K 4.3 3.9  CL 96* 98*  CO2 26 25  GLUCOSE 150* 183*  BUN 75* 74*  CREATININE 10.03* 9.81*  CALCIUM 8.3* 7.8*    Liver Function Tests:  Recent Labs Lab 08/23/15 2319  AST 13*  ALT 7*  ALKPHOS 105  BILITOT 0.6  PROT 5.4*  ALBUMIN 1.5*    Recent Labs Lab 08/23/15 2319  LIPASE 14     Recent Labs Lab 08/23/15 2319  AMMONIA 29    CBC:  Recent Labs Lab 08/23/15 2319 08/24/15 0452 08/25/15 0330  WBC 15.2* 12.9* 13.7*  HGB 6.9* 6.1* 7.7*  HCT 21.9* 19.4* 23.4*  MCV 87.9 88.1 87.6  PLT 467* 439 461*    Cardiac Enzymes: No results for input(s): CKTOTAL, CKMB, CKMBINDEX, TROPONINI in the last 168 hours.  BNP: Invalid input(s): POCBNP  CBG:  Recent Labs Lab 08/24/15 0033 08/24/15 1704 08/24/15 2127 08/25/15 0752 08/25/15 1137  GLUCAP 158* 132* 186* 139* 137*    Microbiology: Results for orders placed or performed during the hospital encounter of 08/23/15  Culture, blood (routine x 2)     Status: None (Preliminary result)   Collection Time: 08/24/15 12:54 AM  Result Value Ref Range Status   Specimen Description BLOOD RIGHT ARM  Final   Special Requests BOTTLES DRAWN AEROBIC AND ANAEROBIC  Final   Culture NO GROWTH 1 DAY  Final   Report Status PENDING  Incomplete  Culture, blood (routine x 2)     Status: None (Preliminary result)   Collection Time: 08/24/15 12:54 AM  Result Value Ref Range Status   Specimen Description BLOOD RIGHT ANTECUBITAL  Final   Special Requests BOTTLES DRAWN AEROBIC AND ANAEROBIC  Final   Culture NO GROWTH 1 DAY  Final   Report Status PENDING  Incomplete    Coagulation Studies:  No results for input(s): LABPROT, INR in the last 72 hours.  Urinalysis: No results for input(s): COLORURINE, LABSPEC, PHURINE, GLUCOSEU, HGBUR, BILIRUBINUR, KETONESUR, PROTEINUR, UROBILINOGEN, NITRITE, LEUKOCYTESUR in the last 72 hours.  Invalid input(s): APPERANCEUR    Imaging: Dg Chest Portable 1 View  08/23/2015  CLINICAL DATA:  Altered mental status.  Anterior chest pain 2 days. EXAM: PORTABLE CHEST 1 VIEW COMPARISON:  07/09/2015 and 05/12/2015 FINDINGS: Lungs are adequately inflated without consolidation or effusion. Cardiomediastinal silhouette and remainder of the exam is unchanged. IMPRESSION: No active disease.  Electronically Signed   By: Elberta Fortis M.D.   On: 08/23/2015 22:38     Medications:     . aspirin EC  81 mg Oral Daily  . calcitRIOL  0.25 mcg Oral Q breakfast  . calcium acetate  2,001 mg Oral TID WC  . clopidogrel  75 mg Oral Daily  . dialysis solution 2.5% low-MG/low-CA   Intraperitoneal Q24H  . simvastatin  20 mg Oral QHS   And  . ezetimibe  10 mg Oral QHS  . gentamicin cream  1 application Topical Daily  . heparin  5,000 Units Subcutaneous Q8H  . insulin aspart  0-5 Units Subcutaneous QHS  . insulin aspart  0-9 Units Subcutaneous TID WC  . levofloxacin (LEVAQUIN) IV  500 mg Intravenous Q48H  . nystatin  1 Bottle Topical BID  . pantoprazole  40 mg Oral QAC breakfast  . ranolazine  500 mg Oral BID  . risperiDONE  1 mg Oral QHS  . sodium chloride flush  3 mL Intravenous Q12H   acetaminophen **OR** acetaminophen, albuterol, docusate sodium, lactulose, nitroGLYCERIN, ondansetron **OR** ondansetron (ZOFRAN) IV, oxyCODONE, senna, zinc oxide  Assessment/ Plan:  50 y.o. female with past medical history of end-stage renal disease on peritoneal dialysis, asthma, gastroparesis, diabetes mellitus, congestive heart failure, hypertension, endometriosis, coronary atherosclerosis/medical management, PD peritonitis with micrococcus treated with meropenem and IV/IP vancomycin  [facility]/peritoneal dialysis  1. End-stage renal disease on peritoneal dialysis.  CCPD- 4 exchanges 8 hours with 2.2 litre fills. 2.5% dextrose  - we had a long discussion with the patientand daughter today.  I also discussed the case with Dr. Glenna Fellows of [facility] nephrology.  Over the past 2 months she spent approximately 40 days in the hospital. She's had 2 recent bouts of peritonitis.  There has also been some question as to whether her to continue dialysis is achieving adequate clearance. Given the clinical circumstance therefore we recommenda switch to hemodialysis at this time.  We will obtain  vascular consult for the same.  2.  Leukocytosis:   Pt had recent micrococcus peritonitis at [facility].  - awaiting PD fluid cell count differential, and culture.  3.  Anemia of CKD:  Hemoglobin currently up to 7.1 post transfusion.  Continue to monitor.  4.  AMS:  Could be related to excess consumption of percocet in a short period of time, under dialysis also maybe playing a role. Unclear if pt able to take care of herself at home.     LOS: 1 Kimberly Montgomery 5/26/201712:36 PM

## 2015-08-25 NOTE — Progress Notes (Signed)
Per Dr. Cherylann Ratel orders not placed for peritoneal dialysis tonight. Pt will be dialyzed tomorrow using temporary dialysis catheter

## 2015-08-26 LAB — CBC
HCT: 20.1 % — ABNORMAL LOW (ref 35.0–47.0)
Hemoglobin: 6.6 g/dL — ABNORMAL LOW (ref 12.0–16.0)
MCH: 29.1 pg (ref 26.0–34.0)
MCHC: 32.7 g/dL (ref 32.0–36.0)
MCV: 89 fL (ref 80.0–100.0)
Platelets: 408 10*3/uL (ref 150–440)
RBC: 2.26 MIL/uL — ABNORMAL LOW (ref 3.80–5.20)
RDW: 15.1 % — AB (ref 11.5–14.5)
WBC: 9.4 10*3/uL (ref 3.6–11.0)

## 2015-08-26 LAB — BASIC METABOLIC PANEL
Anion gap: 15 (ref 5–15)
BUN: 75 mg/dL — AB (ref 6–20)
CALCIUM: 8 mg/dL — AB (ref 8.9–10.3)
CHLORIDE: 95 mmol/L — AB (ref 101–111)
CO2: 24 mmol/L (ref 22–32)
CREATININE: 9.82 mg/dL — AB (ref 0.44–1.00)
GFR calc non Af Amer: 4 mL/min — ABNORMAL LOW (ref >60)
GFR, EST AFRICAN AMERICAN: 5 mL/min — AB (ref >60)
Glucose, Bld: 166 mg/dL — ABNORMAL HIGH (ref 65–99)
Potassium: 4 mmol/L (ref 3.5–5.1)
SODIUM: 134 mmol/L — AB (ref 135–145)

## 2015-08-26 LAB — GLUCOSE, CAPILLARY
GLUCOSE-CAPILLARY: 189 mg/dL — AB (ref 65–99)
GLUCOSE-CAPILLARY: 138 mg/dL — AB (ref 65–99)
GLUCOSE-CAPILLARY: 227 mg/dL — AB (ref 65–99)
Glucose-Capillary: 126 mg/dL — ABNORMAL HIGH (ref 65–99)

## 2015-08-26 MED ORDER — VANCOMYCIN HCL IN DEXTROSE 1-5 GM/200ML-% IV SOLN
1000.0000 mg | INTRAVENOUS | Status: DC
  Administered 2015-08-26: 1000 mg via INTRAVENOUS
  Filled 2015-08-26: qty 200

## 2015-08-26 NOTE — Progress Notes (Signed)
[facility]  ROUNDING NOTE   Subjective:  Patient seen at bedside. She is transitioning to hemodialysis today. Social work updated regarding this.   Objective:  Vital signs in last 24 hours:  Temp:  [98.1 F (36.7 C)-98.7 F (37.1 C)] 98.7 F (37.1 C) (05/27 1231) Pulse Rate:  [94-109] 97 (05/27 1231) Resp:  [16-18] 16 (05/27 1231) BP: (108-139)/(47-62) 139/59 mmHg (05/27 1231) SpO2:  [92 %-95 %] 93 % (05/27 1231) Weight:  [125.465 kg (276 lb 9.6 oz)] 125.465 kg (276 lb 9.6 oz) (05/27 0500)  Weight change: 2.767 kg (6 lb 1.6 oz) Filed Weights   08/24/15 0402 08/25/15 0500 08/26/15 0500  Weight: 122.607 kg (270 lb 4.8 oz) 122.698 kg (270 lb 8 oz) 125.465 kg (276 lb 9.6 oz)    Intake/Output: I/O last 3 completed shifts: In: 900 [P.O.:900] Out: 1487 [Urine:725; Other:762]   Intake/Output this shift:  Total I/O In: 240 [P.O.:240] Out: 0   Physical Exam: General: NAD, resting in bed  Head: Normocephalic, atraumatic. Moist oral mucosal membranes  Eyes: Anicteric  Neck: Supple, trachea midline  Lungs:  Clear to auscultation  Heart: Regular rate and rhythm no rubs  Abdomen:  Soft, nontender, BS present  Extremities: 1+ LLE edema, R BKA  Neurologic: Awake, Alert, moving all four extremities  Skin: No lesions  Access: PD catheter in place, no drainage    Basic Metabolic Panel:  Recent Labs Lab 08/23/15 2319 08/24/15 0452 08/26/15 0617  NA 138 138 134*  K 4.3 3.9 4.0  CL 96* 98* 95*  CO2 26 25 24   GLUCOSE 150* 183* 166*  BUN 75* 74* 75*  CREATININE 10.03* 9.81* 9.82*  CALCIUM 8.3* 7.8* 8.0*    Liver Function Tests:  Recent Labs Lab 08/23/15 2319  AST 13*  ALT 7*  ALKPHOS 105  BILITOT 0.6  PROT 5.4*  ALBUMIN 1.5*    Recent Labs Lab 08/23/15 2319  LIPASE 14    Recent Labs Lab 08/23/15 2319  AMMONIA 29    CBC:  Recent Labs Lab 08/23/15 2319 08/24/15 0452 08/25/15 0330 08/26/15 0617  WBC 15.2* 12.9* 13.7* 9.4  HGB  6.9* 6.1* 7.7* 6.6*  HCT 21.9* 19.4* 23.4* 20.1*  MCV 87.9 88.1 87.6 89.0  PLT 467* 439 461* 408    Cardiac Enzymes: No results for input(s): CKTOTAL, CKMB, CKMBINDEX, TROPONINI in the last 168 hours.  BNP: Invalid input(s): POCBNP  CBG:  Recent Labs Lab 08/25/15 1137 08/25/15 1642 08/25/15 2121 08/26/15 0733 08/26/15 1148  GLUCAP 137* 145* 120* 189* 138*    Microbiology: Results for orders placed or performed during the hospital encounter of 08/23/15  Culture, blood (routine x 2)     Status: None (Preliminary result)   Collection Time: 08/24/15 12:54 AM  Result Value Ref Range Status   Specimen Description BLOOD RIGHT ARM  Final   Special Requests BOTTLES DRAWN AEROBIC AND ANAEROBIC  Final   Culture NO GROWTH 2 DAYS  Final   Report Status PENDING  Incomplete  Culture, blood (routine x 2)     Status: None (Preliminary result)   Collection Time: 08/24/15 12:54 AM  Result Value Ref Range Status   Specimen Description BLOOD RIGHT ANTECUBITAL  Final   Special Requests BOTTLES DRAWN AEROBIC AND ANAEROBIC  Final   Culture NO GROWTH 2 DAYS  Final   Report Status PENDING  Incomplete  Culture, body fluid-bottle     Status: None (Preliminary result)   Collection Time: 08/25/15  4:10  PM  Result Value Ref Range Status   Specimen Description PERITONEAL FLUID  Final   Special Requests NONE  Final   Culture NO GROWTH < 12 HOURS  Final   Report Status PENDING  Incomplete    Coagulation Studies: No results for input(s): LABPROT, INR in the last 72 hours.  Urinalysis: No results for input(s): COLORURINE, LABSPEC, PHURINE, GLUCOSEU, HGBUR, BILIRUBINUR, KETONESUR, PROTEINUR, UROBILINOGEN, NITRITE, LEUKOCYTESUR in the last 72 hours.  Invalid input(s): APPERANCEUR    Imaging: No results found.   Medications:   . sodium chloride 20 mL/hr at 08/25/15 1751   . aspirin EC  81 mg Oral Daily  . calcitRIOL  0.25 mcg Oral Q breakfast  . calcium acetate  2,001 mg Oral  TID WC  . clopidogrel  75 mg Oral Daily  . dialysis solution 2.5% low-MG/low-CA   Intraperitoneal Q24H  . erythromycin  250 mg Oral TID WC & HS  . ezetimibe  10 mg Oral QHS  . gentamicin cream  1 application Topical Daily  . heparin  5,000 Units Subcutaneous Q8H  . insulin aspart  0-5 Units Subcutaneous QHS  . insulin aspart  0-9 Units Subcutaneous TID WC  . levofloxacin (LEVAQUIN) IV  500 mg Intravenous Q48H  . nicotine  14 mg Transdermal Daily  . nystatin  1 Bottle Topical BID  . pantoprazole  40 mg Oral QAC breakfast  . ranolazine  500 mg Oral BID  . risperiDONE  1 mg Oral QHS  . sodium chloride flush  3 mL Intravenous Q12H  . vancomycin  1,000 mg Intravenous Q T,Th,Sa-HD   acetaminophen **OR** acetaminophen, albuterol, docusate sodium, lactulose, nitroGLYCERIN, ondansetron **OR** ondansetron (ZOFRAN) IV, oxyCODONE, senna, zinc oxide  Assessment/ Plan:  50 y.o. female with past medical history of end-stage renal disease on peritoneal dialysis, asthma, gastroparesis, diabetes mellitus, congestive heart failure, hypertension, endometriosis, coronary atherosclerosis/medical management, PD peritonitis with micrococcus treated with meropenem and IV/IP vancomycin  [facility]/[address]/peritoneal dialysis  1. End-stage renal disease on peritoneal dialysis. - Now transitioning temporarily back to HD. CCPD- 4 exchanges 8 hours with 2.2 litre fills. 2.5% dextrose  - We are transitioning the patient back over to hemodialysis temporarily. She will need aggressive rehabilitation. Under dialysis may been playing a role in her recent deterioration. We plan for hemodialysis today.  2.  Leukocytosis:   Pt had recent micrococcus peritonitis at [facility].  - PD fluid cultures thus far negative. Patient on vancomycin and Levaquin at the moment. Continue to monitor closely.  3.  Anemia of CKD:  Hemoglobin down to 6.6. Consider additional blood transfusion given drop of hemoglobin.  4.  AMS:   Could be related to excess consumption of percocet in a short period of time, under dialysis also maybe playing a role. Unclear if pt able to take care of herself at home.  Patient transitioning to hemodialysis.  5.  Depression. The patient has had recent bouts of depression. She reports that she has been unable to take care of her self and feels like that she is losing control.  Recently she has been on risperidone as well as Abilify. We will request psychiatry consultation for further evaluation.   LOS: 2 Kimberly Montgomery 5/27/20172:44 PM

## 2015-08-26 NOTE — Progress Notes (Signed)
DIALYSIS COMPLETE.D.

## 2015-08-26 NOTE — Clinical Social Work Note (Signed)
Patient not to discharge to [facility] today due to drop in hgb. If patient is ready to discharge on 5/28, patient can still go to [facility] on a 5 day letter of guarantee (due to auth from [place] not being received yet). Patient is able to go to [facility] even though she is on peritoneal dialysis. They are making an exception in taking her. York Spaniel MSW,LCSW [phone]

## 2015-08-26 NOTE — Progress Notes (Signed)
Pharmacy Antibiotic Note  Kimberly Montgomery is a 50 y.o. female admitted on 08/23/2015 with sepsis.  Pharmacy has been consulted for Vancomycin and Levofloxacin dosing. Patient previously receiving PD and received a temporary HD cath to start IHD today.   Plan: Per notes, patient is to receive IHD today.  Patient received Vancomycin 2 gm IV on 5/25. Will order Vancomycin 1 gm IV qHD on Tues/Thurs/Sat after HD scheduled.  No nephrology note at this time so unsure of schedule. Will need to follow up regarding schedule and order trough prior to 3rd session.   Continue Levofloxacin 500 mg IV q48h based on renal function.   Height:  (175.3 cm) Weight: 276 lb 9.6 oz (125.465 kg) IBW/kg (Calculated) : 66.2  Temp (24hrs), Avg:98.2 F (36.8 C), Min:98.1 F (36.7 C), Max:98.3 F (36.8 C)   Recent Labs Lab 08/23/15 2319 08/24/15 0452 08/25/15 0330 08/26/15 0617  WBC 15.2* 12.9* 13.7* 9.4  CREATININE 10.03* 9.81*  --  9.82*  LATICACIDVEN 1.0  --   --   --     Estimated Creatinine Clearance: 9.6 mL/min (by C-Montgomery formula based on Cr of 9.82).    Allergies  Allergen Reactions  . Cephalosporins Anaphylaxis  . Penicillins Anaphylaxis and Other (See Comments)    Has patient had a PCN reaction causing immediate rash, facial/tongue/throat swelling, SOB or lightheadedness with hypotension: Yes Has patient had a PCN reaction causing severe rash involving mucus membranes or skin necrosis: No Has patient had a PCN reaction that required hospitalization No Has patient had a PCN reaction occurring within the last 10 years: No If all of the above answers are "NO", then may proceed with Cephalosporin use.  . Lamictal [Lamotrigine] Other (See Comments)    Reaction:  Hallucinations  . Phenergan [Promethazine Hcl] Nausea And Vomiting  . Pravastatin Other (See Comments)    Reaction:  Muscle pain   . Sulfa Antibiotics Other (See Comments)    Reaction:  Unknown     Antimicrobials this  admission: Anti-infectives    Start     Dose/Rate Route Frequency Ordered Stop   08/26/15 1200  vancomycin (VANCOCIN) IVPB 1000 mg/200 mL premix     1,000 mg 200 mL/hr over 60 Minutes Intravenous Every T-Th-Sa (Hemodialysis) 08/26/15 1034     08/25/15 1800  levofloxacin (LEVAQUIN) IVPB 500 mg     500 mg 100 mL/hr over 60 Minutes Intravenous Every 48 hours 08/24/15 0350     08/25/15 1700  erythromycin (E-MYCIN) tablet 250 mg     250 mg Oral 3 times daily with meals & bedtime 08/25/15 1244     08/24/15 0200  vancomycin (VANCOCIN) IVPB 1000 mg/200 mL premix     1,000 mg 200 mL/hr over 60 Minutes Intravenous  Once 08/24/15 0159 08/24/15 0518   08/24/15 0100  vancomycin (VANCOCIN) IVPB 1000 mg/200 mL premix     1,000 mg 200 mL/hr over 60 Minutes Intravenous  Once 08/24/15 0049 08/24/15 0203   08/24/15 0100  levofloxacin (LEVAQUIN) IVPB 750 mg     750 mg 100 mL/hr over 90 Minutes Intravenous  Once 08/24/15 0049 08/24/15 0243       Dose adjustments this admission: Follow up for dosing of Vancomycin and trough once HD schedule is known  Microbiology results: Results for orders placed or performed during the hospital encounter of 08/23/15  Culture, blood (routine x 2)     Status: None (Preliminary result)   Collection Time: 08/24/15 12:54 AM  Result Value Ref Range  Status   Specimen Description BLOOD RIGHT ARM  Final   Special Requests BOTTLES DRAWN AEROBIC AND ANAEROBIC  Final   Culture NO GROWTH 2 DAYS  Final   Report Status PENDING  Incomplete  Culture, blood (routine x 2)     Status: None (Preliminary result)   Collection Time: 08/24/15 12:54 AM  Result Value Ref Range Status   Specimen Description BLOOD RIGHT ANTECUBITAL  Final   Special Requests BOTTLES DRAWN AEROBIC AND ANAEROBIC  Final   Culture NO GROWTH 2 DAYS  Final   Report Status PENDING  Incomplete  Culture, body fluid-bottle     Status: None (Preliminary result)   Collection Time: 08/25/15  4:10 PM  Result  Value Ref Range Status   Specimen Description PERITONEAL FLUID  Final   Special Requests NONE  Final   Culture NO GROWTH < 12 HOURS  Final   Report Status PENDING  Incomplete    Thank you for allowing pharmacy to be a part of this patient's care.  Kimberly Montgomery 08/26/2015 10:40 AM

## 2015-08-26 NOTE — Progress Notes (Signed)
POST DIALYSIS ASSESSMENT 

## 2015-08-26 NOTE — Progress Notes (Signed)
Pre dialysis  

## 2015-08-26 NOTE — Progress Notes (Signed)
This note also relates to the following rows which could not be included: Pulse Rate - Cannot attach notes to unvalidated device data Resp - Cannot attach notes to unvalidated device data BP - Cannot attach notes to unvalidated device data   Started hemodialysis

## 2015-08-26 NOTE — Progress Notes (Signed)
[facility] [place] [facility]   PATIENT NAME: Kimberly Montgomery    MR#:  [id number]  DATE OF BIRTH:  1966/03/31  SUBJECTIVE:  CHIEF COMPLAINT:   Chief Complaint  Patient presents with  . Altered Mental Status  Mental status improving today. Weakness. REVIEW OF SYSTEMS:  CONSTITUTIONAL: No fever, Has generalized weakness.      CARDIOVASCULAR: No chest pain, orthopnea, edema.   DRUG ALLERGIES:   Allergies  Allergen Reactions  . Cephalosporins Anaphylaxis  . Penicillins Anaphylaxis and Other (See Comments)    Has patient had a PCN reaction causing immediate rash, facial/tongue/throat swelling, SOB or lightheadedness with hypotension: Yes Has patient had a PCN reaction causing severe rash involving mucus membranes or skin necrosis: No Has patient had a PCN reaction that required hospitalization No Has patient had a PCN reaction occurring within the last 10 years: No If all of the above answers are "NO", then may proceed with Cephalosporin use.  . Lamictal [Lamotrigine] Other (See Comments)    Reaction:  Hallucinations  . Phenergan [Promethazine Hcl] Nausea And Vomiting  . Pravastatin Other (See Comments)    Reaction:  Muscle pain   . Sulfa Antibiotics Other (See Comments)    Reaction:  Unknown     VITALS:  Blood pressure 138/62, pulse 109, temperature 98.1 F (36.7 C), temperature source Oral, resp. rate 18, height 5\' 9"  (1.753 m), weight 125.465 kg (276 lb 9.6 oz), last menstrual period 03/16/2015, SpO2 95 %.  PHYSICAL EXAMINATION:  GENERAL:  50 y.o.-year-old patient sitting in the bed with no acute distress. Morbid obese. EYES: Pupils equal, round, reactive to light and accommodation. No scleral icterus. Extraocular muscles intact.  HEENT: Head atraumatic, normocephalic. Oropharynx and nasopharynx clear.  NECK:  Supple, no jugular venous distention. No thyroid enlargement, LUNGS: CTA, no wheezing, No use of accessory muscles of respiration. No  dullness to percussion  CARDIOVASCULAR: S1, S2 normal. No murmurs, rubs, or gallops.  ABDOMEN: Soft, nontender, nondistended. Bowel sounds present. No organomegaly or mass. Peritoneal dailysis cath in right abd. Right femeral dialysis cath in place.  EXTREMITIES: Mild LE edema, cyanosis, or clubbing. Right BKA. NEUROLOGIC: Cranial nerves II through XII are intact. Muscle strength 5/5 in all extremities. Sensation intact. Gait not checked.  PSYCHIATRIC: The patient is alert and oriented x 3.  SKIN: No obvious rash, lesion, or ulcer.    LABORATORY PANEL:   CBC  Recent Labs Lab 08/26/15 0617  WBC 9.4  HGB 6.6*  HCT 20.1*  PLT 408   ------------------------------------------------------------------------------------------------------------------  Chemistries   Recent Labs Lab 08/23/15 2319  08/26/15 0617  NA 138  < > 134*  K 4.3  < > 4.0  CL 96*  < > 95*  CO2 26  < > 24  GLUCOSE 150*  < > 166*  BUN 75*  < > 75*  CREATININE 10.03*  < > 9.82*  CALCIUM 8.3*  < > 8.0*  AST 13*  --   --   ALT 7*  --   --   ALKPHOS 105  --   --   BILITOT 0.6  --   --   < > = values in this interval not displayed. ------------------------------------------------------------------------------------------------------------------  Cardiac Enzymes No results for input(s): TROPONINI in the last 168 hours. ------------------------------------------------------------------------------------------------------------------  RADIOLOGY:  No results found.  EKG:   Orders placed or performed during the hospital encounter of 08/23/15  . ED EKG  . ED EKG  . EKG 12-Lead  . EKG  12-Lead    ASSESSMENT AND PLAN:    1. Acute encephalopathy, possible due to percocet. Resolved.   2. Sepsis, rule out peritonitis.  Continue IV vancomycin and IV Levaquin,WBC down, blood cultures and peritoneal fluid analysis and cultures all negative so far.  3. End-stage renal disease on peritoneal dialysis.  Start  HD today per Dr. Cherylann Ratel. Followup labs post dialysis.  4. Hypertension. Low side BP,  imdur and torsemide are on hold.  5.  Anemia of chronic disease. No active bleeding. Hb down to from 6.9 to 6.1. Given 1 units PRBC transfusion. Hgb still low but near baseline. Likely from chronic renal dz. Will differ to Dr Cherylann Ratel for any further transfusion. Asymptomatic..  F/u Hb in am.  6. DM. On sliding scale.  PT: SNF.  I discussed with Dr. Cherylann Ratel. All the records are reviewed and case discussed with Care Management/Social Workerr. Management plans discussed with the patient, family and they are in agreement.  CODE STATUS: full code.  TOTAL TIME TAKING CARE OF THIS PATIENT:36 minutes.  Greater than 50% time was spent on coordination of care and face-to-face counseling.  POSSIBLE D/C IN 2 DAYS, DEPENDING ON CLINICAL CONDITION.  Time spent= 25 min   Alta Corning.D on 08/26/2015 at 8:15 AM  Between 7am to 6pm - Pager - [phone]  After 6pm go to [email or web address] - password [email or web address] [facility]  [facility]  Office  [phone]  CC: Primary care physician; Jarome Matin, MD

## 2015-08-27 LAB — GLUCOSE, CAPILLARY
GLUCOSE-CAPILLARY: 143 mg/dL — AB (ref 65–99)
GLUCOSE-CAPILLARY: 220 mg/dL — AB (ref 65–99)
Glucose-Capillary: 204 mg/dL — ABNORMAL HIGH (ref 65–99)
Glucose-Capillary: 211 mg/dL — ABNORMAL HIGH (ref 65–99)

## 2015-08-27 LAB — PREPARE RBC (CROSSMATCH)

## 2015-08-27 LAB — HEMOGLOBIN: HEMOGLOBIN: 6.6 g/dL — AB (ref 12.0–16.0)

## 2015-08-27 MED ORDER — SODIUM CHLORIDE 0.9 % IV SOLN
Freq: Once | INTRAVENOUS | Status: AC
  Administered 2015-08-27: 11:00:00 via INTRAVENOUS

## 2015-08-27 MED ORDER — DIPHENHYDRAMINE HCL 50 MG/ML IJ SOLN
25.0000 mg | Freq: Once | INTRAMUSCULAR | Status: AC
  Administered 2015-08-27: 25 mg via INTRAVENOUS

## 2015-08-27 MED ORDER — DIPHENHYDRAMINE HCL 50 MG/ML IJ SOLN
INTRAMUSCULAR | Status: AC
  Filled 2015-08-27: qty 1

## 2015-08-27 NOTE — Progress Notes (Signed)
Pharmacy Antibiotic Note  Kimberly Montgomery is a 50 y.o. female admitted on 08/23/2015 with sepsis.  Pharmacy has been consulted for Vancomycin and Levofloxacin dosing. Patient previously receiving PD and received a temporary HD cath to start IHD today.   Plan: Patient received Vancomycin 1 gm IV after HD on 5/27.  Per dialysis RN, patient is not on schedule for HD today.  It still remains unclear as to what HD schedule she will continue with. Will continue orders for Vancomycin 1 gm IV qHD on Tues/Thurs/Sat. Will need to follow up regarding schedule and order trough prior to 3rd session.   Continue Levofloxacin 500 mg IV q48h based on renal function.   Height: 5\' 9"  (175.3 cm) Weight: 273 lb 14.4 oz (124.24 kg) IBW/kg (Calculated) : 66.2  Temp (24hrs), Avg:98.6 F (37 C), Min:98.1 F (36.7 C), Max:99.3 F (37.4 C)   Recent Labs Lab 08/23/15 2319 08/24/15 0452 08/25/15 0330 08/26/15 0617  WBC 15.2* 12.9* 13.7* 9.4  CREATININE 10.03* 9.81*  --  9.82*  LATICACIDVEN 1.0  --   --   --     Estimated Creatinine Clearance: 9.6 mL/min (by C-G formula based on Cr of 9.82).    Allergies  Allergen Reactions  . Cephalosporins Anaphylaxis  . Penicillins Anaphylaxis and Other (See Comments)    Has patient had a PCN reaction causing immediate rash, facial/tongue/throat swelling, SOB or lightheadedness with hypotension: Yes Has patient had a PCN reaction causing severe rash involving mucus membranes or skin necrosis: No Has patient had a PCN reaction that required hospitalization No Has patient had a PCN reaction occurring within the last 10 years: No If all of the above answers are "NO", then may proceed with Cephalosporin use.  . Lamictal [Lamotrigine] Other (See Comments)    Reaction:  Hallucinations  . Phenergan [Promethazine Hcl] Nausea And Vomiting  . Pravastatin Other (See Comments)    Reaction:  Muscle pain   . Sulfa Antibiotics Other (See Comments)    Reaction:  Unknown      Antimicrobials this admission: Anti-infectives    Start     Dose/Rate Route Frequency Ordered Stop   08/26/15 1200  vancomycin (VANCOCIN) IVPB 1000 mg/200 mL premix     1,000 mg 200 mL/hr over 60 Minutes Intravenous Every T-Th-Sa (Hemodialysis) 08/26/15 1034     08/25/15 1800  levofloxacin (LEVAQUIN) IVPB 500 mg     500 mg 100 mL/hr over 60 Minutes Intravenous Every 48 hours 08/24/15 0350     08/25/15 1700  erythromycin (E-MYCIN) tablet 250 mg     250 mg Oral 3 times daily with meals & bedtime 08/25/15 1244     08/24/15 0200  vancomycin (VANCOCIN) IVPB 1000 mg/200 mL premix     1,000 mg 200 mL/hr over 60 Minutes Intravenous  Once 08/24/15 0159 08/24/15 0518   08/24/15 0100  vancomycin (VANCOCIN) IVPB 1000 mg/200 mL premix     1,000 mg 200 mL/hr over 60 Minutes Intravenous  Once 08/24/15 0049 08/24/15 0203   08/24/15 0100  levofloxacin (LEVAQUIN) IVPB 750 mg     750 mg 100 mL/hr over 90 Minutes Intravenous  Once 08/24/15 0049 08/24/15 0243       Dose adjustments this admission: Follow up to schedule trough once HD schedule is known  Microbiology results: Results for orders placed or performed during the hospital encounter of 08/23/15  Culture, blood (routine x 2)     Status: None (Preliminary result)   Collection Time: 08/24/15 12:54 AM  Result Value Ref Range Status   Specimen Description BLOOD RIGHT ARM  Final   Special Requests BOTTLES DRAWN AEROBIC AND ANAEROBIC  Final   Culture NO GROWTH 3 DAYS  Final   Report Status PENDING  Incomplete  Culture, blood (routine x 2)     Status: None (Preliminary result)   Collection Time: 08/24/15 12:54 AM  Result Value Ref Range Status   Specimen Description BLOOD RIGHT ANTECUBITAL  Final   Special Requests BOTTLES DRAWN AEROBIC AND ANAEROBIC  Final   Culture NO GROWTH 3 DAYS  Final   Report Status PENDING  Incomplete  Culture, body fluid-bottle     Status: None (Preliminary result)   Collection Time: 08/25/15  4:10 PM   Result Value Ref Range Status   Specimen Description PERITONEAL FLUID  Final   Special Requests NONE  Final   Culture NO GROWTH 2 DAYS  Final   Report Status PENDING  Incomplete    Thank you for allowing pharmacy to be a part of this patient's care.  Shakayla Hickox G 08/27/2015 3:18 PM

## 2015-08-27 NOTE — Progress Notes (Signed)
Patient receiving two units of blood this shift. 20 minutes after starting second unit, patient complained of hands itching. Dr. Letitia Libra notified, received order to give Benadryl 25mg  IV once.  Patient received Benadryl and itching stopped, patient rested with no additional complaints.  Loel Ro, RN 08-27-15 [phone]

## 2015-08-27 NOTE — Clinical Social Work Note (Signed)
Late entry: Dr. Cherylann Ratel informed CSW yesterday that the plan has changed for patient regarding dialysis. Patient is going to convert to hemodialysis. Patient would still be able to go to [facility] at discharge. According to Dr. Cherylann Ratel, patient will need to be in the hospital several more days due to needing to observe her on hemodialysis and to also insert a permcath. CSW will continue to follow. York Spaniel MSW,LCSW [phone]

## 2015-08-27 NOTE — Progress Notes (Signed)
[facility]  ROUNDING NOTE   Subjective:  Patient tolerated first dialysis treatment quite well. Next line hemoglobin down to 6.6. Patient receiving blood transfusion today.   Objective:  Vital signs in last 24 hours:  Temp:  [98.1 F (36.7 C)-99.3 F (37.4 C)] 98.8 F (37.1 C) (05/28 1528) Pulse Rate:  [91-101] 91 (05/28 1528) Resp:  [12-16] 12 (05/28 1528) BP: (131-151)/(54-68) 151/68 mmHg (05/28 1528) SpO2:  [91 %-100 %] 91 % (05/28 1528) Weight:  [124.24 kg (273 lb 14.4 oz)-124.3 kg (274 lb 0.5 oz)] 124.24 kg (273 lb 14.4 oz) (05/28 0501)  Weight change: 0.535 kg (1 lb 2.9 oz) Filed Weights   08/26/15 1500 08/26/15 1800 08/27/15 0501  Weight: 126 kg (277 lb 12.5 oz) 124.3 kg (274 lb 0.5 oz) 124.24 kg (273 lb 14.4 oz)    Intake/Output: I/O last 3 completed shifts: In: 660 [P.O.:660] Out: 550 [Urine:550]   Intake/Output this shift:  Total I/O In: 863.5 [P.O.:480; Blood:383.5] Out: -   Physical Exam: General: NAD, resting in bed  Head: Normocephalic, atraumatic. Moist oral mucosal membranes  Eyes: Anicteric  Neck: Supple, trachea midline  Lungs:  Clear to auscultation  Heart: Regular rate and rhythm no rubs  Abdomen:  Soft, nontender, BS present  Extremities: 1+ LLE edema, R BKA  Neurologic: Awake, Alert, moving all four extremities  Skin: No lesions  Access: PD catheter in place, no drainage    Basic Metabolic Panel:  Recent Labs Lab 08/23/15 2319 08/24/15 0452 08/26/15 0617  NA 138 138 134*  K 4.3 3.9 4.0  CL 96* 98* 95*  CO2 26 25 24   GLUCOSE 150* 183* 166*  BUN 75* 74* 75*  CREATININE 10.03* 9.81* 9.82*  CALCIUM 8.3* 7.8* 8.0*    Liver Function Tests:  Recent Labs Lab 08/23/15 2319  AST 13*  ALT 7*  ALKPHOS 105  BILITOT 0.6  PROT 5.4*  ALBUMIN 1.5*    Recent Labs Lab 08/23/15 2319  LIPASE 14    Recent Labs Lab 08/23/15 2319  AMMONIA 29    CBC:  Recent Labs Lab 08/23/15 2319 08/24/15 0452 08/25/15 0330  08/26/15 0617 08/27/15 0811  WBC 15.2* 12.9* 13.7* 9.4  --   HGB 6.9* 6.1* 7.7* 6.6* 6.6*  HCT 21.9* 19.4* 23.4* 20.1*  --   MCV 87.9 88.1 87.6 89.0  --   PLT 467* 439 461* 408  --     Cardiac Enzymes: No results for input(s): CKTOTAL, CKMB, CKMBINDEX, TROPONINI in the last 168 hours.  BNP: Invalid input(s): POCBNP  CBG:  Recent Labs Lab 08/26/15 1148 08/26/15 1844 08/26/15 2123 08/27/15 0728 08/27/15 1127  GLUCAP 138* 126* 227* 143* 204*    Microbiology: Results for orders placed or performed during the hospital encounter of 08/23/15  Culture, blood (routine x 2)     Status: None (Preliminary result)   Collection Time: 08/24/15 12:54 AM  Result Value Ref Range Status   Specimen Description BLOOD RIGHT ARM  Final   Special Requests BOTTLES DRAWN AEROBIC AND ANAEROBIC  Final   Culture NO GROWTH 3 DAYS  Final   Report Status PENDING  Incomplete  Culture, blood (routine x 2)     Status: None (Preliminary result)   Collection Time: 08/24/15 12:54 AM  Result Value Ref Range Status   Specimen Description BLOOD RIGHT ANTECUBITAL  Final   Special Requests BOTTLES DRAWN AEROBIC AND ANAEROBIC  Final   Culture NO GROWTH 3 DAYS  Final   Report  Status PENDING  Incomplete  Culture, body fluid-bottle     Status: None (Preliminary result)   Collection Time: 08/25/15  4:10 PM  Result Value Ref Range Status   Specimen Description PERITONEAL FLUID  Final   Special Requests NONE  Final   Culture NO GROWTH 2 DAYS  Final   Report Status PENDING  Incomplete    Coagulation Studies: No results for input(s): LABPROT, INR in the last 72 hours.  Urinalysis: No results for input(s): COLORURINE, LABSPEC, PHURINE, GLUCOSEU, HGBUR, BILIRUBINUR, KETONESUR, PROTEINUR, UROBILINOGEN, NITRITE, LEUKOCYTESUR in the last 72 hours.  Invalid input(s): APPERANCEUR    Imaging: No results found.   Medications:   . sodium chloride 20 mL/hr at 08/27/15 1114   . aspirin EC  81 mg Oral  Daily  . calcitRIOL  0.25 mcg Oral Q breakfast  . calcium acetate  2,001 mg Oral TID WC  . clopidogrel  75 mg Oral Daily  . dialysis solution 2.5% low-MG/low-CA   Intraperitoneal Q24H  . diphenhydrAMINE      . diphenhydrAMINE  25 mg Intravenous Once  . erythromycin  250 mg Oral TID WC & HS  . ezetimibe  10 mg Oral QHS  . gentamicin cream  1 application Topical Daily  . heparin  5,000 Units Subcutaneous Q8H  . insulin aspart  0-5 Units Subcutaneous QHS  . insulin aspart  0-9 Units Subcutaneous TID WC  . levofloxacin (LEVAQUIN) IV  500 mg Intravenous Q48H  . nicotine  14 mg Transdermal Daily  . nystatin  1 Bottle Topical BID  . pantoprazole  40 mg Oral QAC breakfast  . ranolazine  500 mg Oral BID  . risperiDONE  1 mg Oral QHS  . sodium chloride flush  3 mL Intravenous Q12H  . vancomycin  1,000 mg Intravenous Q T,Th,Sa-HD   acetaminophen **OR** acetaminophen, albuterol, docusate sodium, lactulose, nitroGLYCERIN, ondansetron **OR** ondansetron (ZOFRAN) IV, oxyCODONE, senna, zinc oxide  Assessment/ Plan:  50 y.o. female with past medical history of end-stage renal disease on peritoneal dialysis, asthma, gastroparesis, diabetes mellitus, congestive heart failure, hypertension, endometriosis, coronary atherosclerosis/medical management, PD peritonitis with micrococcus treated with meropenem and IV/IP vancomycin  [facility]/[address]/peritoneal dialysis  1. End-stage renal disease on peritoneal dialysis. - Now transitioning temporarily back to HD. CCPD- 4 exchanges 8 hours with 2.2 litre fills. 2.5% dextrose  - Patient had first hemodialysis treatment yesterday. She will need a PermCath to be placed early next week once the vascular lab is functional again. We plan for dialysis again tomorrow.  2.  Leukocytosis:   Pt had recent micrococcus peritonitis at [facility].  - PD fluid cultures remain negative. The patient remains on vancomycin and Levaquin for now.  3.  Anemia of CKD:   Hemoglobin 6.6. Patient receiving blood transfusion today.   4.  AMS:  Could be related to excess consumption of percocet in a short period of time, under dialysis also maybe playing a role.  - Patient much more awake and alert today. Under dialysis may have been playing a role.  5.  Depression. The patient has had recent bouts of depression. She reports that she has been unable to take care of her self and feels like that she is losing control.  Recently she has been on risperidone as well as Abilify.  - Awaiting psychiatry evaluation.   LOS: 3 Kimberly Montgomery 5/28/20173:39 PM

## 2015-08-27 NOTE — Progress Notes (Signed)
[facility] - [place] at [facility]   PATIENT NAME: Kimberly Montgomery    MR#:  [id number]  DATE OF BIRTH:  08-Jan-1966  SUBJECTIVE:  CHIEF COMPLAINT:   Chief Complaint  Patient presents with  . Altered Mental Status  Mental status improving today. C/O constipation REVIEW OF SYSTEMS:  CONSTITUTIONAL: No fever,      CARDIOVASCULAR: No chest pain, orthopnea, edema.   DRUG ALLERGIES:   Allergies  Allergen Reactions  . Cephalosporins Anaphylaxis  . Penicillins Anaphylaxis and Other (See Comments)    Has patient had a PCN reaction causing immediate rash, facial/tongue/throat swelling, SOB or lightheadedness with hypotension: Yes Has patient had a PCN reaction causing severe rash involving mucus membranes or skin necrosis: No Has patient had a PCN reaction that required hospitalization No Has patient had a PCN reaction occurring within the last 10 years: No If all of the above answers are "NO", then may proceed with Cephalosporin use.  . Lamictal [Lamotrigine] Other (See Comments)    Reaction:  Hallucinations  . Phenergan [Promethazine Hcl] Nausea And Vomiting  . Pravastatin Other (See Comments)    Reaction:  Muscle pain   . Sulfa Antibiotics Other (See Comments)    Reaction:  Unknown     VITALS:  Blood pressure 139/58, pulse 100, temperature 98.5 F (36.9 C), temperature source Oral, resp. rate 16, height  (1.753 m), weight 124.24 kg (273 lb 14.4 oz), last menstrual period 03/16/2015, SpO2 100 %.  PHYSICAL EXAMINATION:  GENERAL:  50 y.o.-year-old patient sitting in the bed with no acute distress. Morbid obese. EYES: Pupils equal, round,  HEENT:  Oropharynx and nasopharynx clear.  NECK:  Supple, no jugular venous distention.  LUNGS: CTA, no wheezing, No use of accessory muscles of respiration. No dullness to percussion  CARDIOVASCULAR: S1, S2 normal. No murmurs, rubs, or gallops.  ABDOMEN: Soft, nontender, nondistended. Bowel sounds present. No  organomegaly or mass. Peritoneal dailysis cath in right abd. Right femeral dialysis cath in place.  EXTREMITIES: Mild LE edema Right BKA. NEUROLOGIC: Cranial nerves II through XII are intact. Muscle strength 5/5 in all extremities. Sensation intact. PSYCHIATRIC: The patient is alert and oriented x 3.  SKIN: No obvious rash, lesion, or ulcer.    LABORATORY PANEL:   CBC  Recent Labs Lab 08/26/15 0617 08/27/15 0811  WBC 9.4  --   HGB 6.6* 6.6*  HCT 20.1*  --   PLT 408  --    ------------------------------------------------------------------------------------------------------------------  Chemistries   Recent Labs Lab 08/23/15 2319  08/26/15 0617  NA 138  < > 134*  K 4.3  < > 4.0  CL 96*  < > 95*  CO2 26  < > 24  GLUCOSE 150*  < > 166*  BUN 75*  < > 75*  CREATININE 10.03*  < > 9.82*  CALCIUM 8.3*  < > 8.0*  AST 13*  --   --   ALT 7*  --   --   ALKPHOS 105  --   --   BILITOT 0.6  --   --   < > = values in this interval not displayed. ------------------------------------------------------------------------------------------------------------------  Cardiac Enzymes No results for input(s): TROPONINI in the last 168 hours. ------------------------------------------------------------------------------------------------------------------  RADIOLOGY:  No results found.  EKG:   Orders placed or performed during the hospital encounter of 08/23/15  . ED EKG  . ED EKG  . EKG 12-Lead  . EKG 12-Lead    ASSESSMENT AND PLAN:    1. Acute  encephalopathy, possible due to percocet. Resolved.   2. Sepsis, rule out peritonitis.  Continue IV vancomycin and IV Levaquin,WBC down, blood cultures and peritoneal fluid analysis and cultures all negative so far. Will continue with abx for now.  3. End-stage renal disease on peritoneal dialysis.  Start HD today per Dr. Cherylann Ratel. Converting to HD. Will need several more days in hospital..  4. Hypertension. Low side BP,  imdur and  torsemide are on hold.  5.  Anemia of chronic disease. No active bleeding. Hb down to from 6.9 to 6.1. Given 1 units PRBC transfusion. Hgb still low but near baseline. Likely from chronic renal dz. Will differ to Dr Cherylann Ratel for any further transfusion. Today's Hgb still pending.  F/u Hb in am.  6. DM. On sliding scale.  PT: SNF.  I discussed with Dr. Cherylann Ratel. All the records are reviewed and case discussed with Care Management/Social Workerr. Management plans discussed with the patient, family and they are in agreement.  CODE STATUS: full code.  TOTAL TIME TAKING CARE OF THIS PATIENT:22 minutes.  POSSIBLE D/C IN 2 DAYS, DEPENDING ON CLINICAL CONDITION.  Time spent= 25 min   Alta Corning.D on 08/27/2015 at 8:48 AM  Between 7am to 6pm - Pager - [phone]  After 6pm go to [email or web address] - password [email or web address] [facility]  [place] [facility]  Office  [phone]  CC: Primary care physician; Jarome Matin, MD

## 2015-08-28 DIAGNOSIS — F3130 Bipolar disorder, current episode depressed, mild or moderate severity, unspecified: Secondary | ICD-10-CM

## 2015-08-28 LAB — TYPE AND SCREEN
ABO/RH(D): A POS
ANTIBODY SCREEN: NEGATIVE
UNIT DIVISION: 0
UNIT DIVISION: 0
Unit division: 0

## 2015-08-28 LAB — CBC
HEMATOCRIT: 29.8 % — AB (ref 35.0–47.0)
Hemoglobin: 9.7 g/dL — ABNORMAL LOW (ref 12.0–16.0)
MCH: 28.6 pg (ref 26.0–34.0)
MCHC: 32.7 g/dL (ref 32.0–36.0)
MCV: 87.5 fL (ref 80.0–100.0)
PLATELETS: 392 10*3/uL (ref 150–440)
RBC: 3.41 MIL/uL — ABNORMAL LOW (ref 3.80–5.20)
RDW: 15 % — AB (ref 11.5–14.5)
WBC: 8.3 10*3/uL (ref 3.6–11.0)

## 2015-08-28 LAB — GLUCOSE, CAPILLARY
GLUCOSE-CAPILLARY: 245 mg/dL — AB (ref 65–99)
GLUCOSE-CAPILLARY: 222 mg/dL — AB (ref 65–99)
Glucose-Capillary: 147 mg/dL — ABNORMAL HIGH (ref 65–99)

## 2015-08-28 MED ORDER — VANCOMYCIN HCL IN DEXTROSE 1-5 GM/200ML-% IV SOLN
1000.0000 mg | INTRAVENOUS | Status: DC | PRN
  Administered 2015-08-28 – 2015-08-30 (×2): 1000 mg via INTRAVENOUS
  Filled 2015-08-28 (×4): qty 200

## 2015-08-28 MED ORDER — ESCITALOPRAM OXALATE 10 MG PO TABS
10.0000 mg | ORAL_TABLET | Freq: Every day | ORAL | Status: DC
  Administered 2015-08-28 – 2015-08-31 (×3): 10 mg via ORAL
  Filled 2015-08-28 (×3): qty 1

## 2015-08-28 NOTE — Progress Notes (Signed)
[facility] - [place] at [facility]   PATIENT NAME: Kimberly Montgomery    MR#:  [id number]  DATE OF BIRTH:  24-May-1965  SUBJECTIVE:  CHIEF COMPLAINT:   Chief Complaint  Patient presents with  . Altered Mental Status  No omplaints today  REVIEW OF SYSTEMS:  CONSTITUTIONAL: No fever,    CARDIOVASCULAR: No chest pain, orthopnea, edema.   DRUG ALLERGIES:   Allergies  Allergen Reactions  . Cephalosporins Anaphylaxis  . Penicillins Anaphylaxis and Other (See Comments)    Has patient had a PCN reaction causing immediate rash, facial/tongue/throat swelling, SOB or lightheadedness with hypotension: Yes Has patient had a PCN reaction causing severe rash involving mucus membranes or skin necrosis: No Has patient had a PCN reaction that required hospitalization No Has patient had a PCN reaction occurring within the last 10 years: No If all of the above answers are "NO", then may proceed with Cephalosporin use.  . Lamictal [Lamotrigine] Other (See Comments)    Reaction:  Hallucinations  . Phenergan [Promethazine Hcl] Nausea And Vomiting  . Pravastatin Other (See Comments)    Reaction:  Muscle pain   . Sulfa Antibiotics Other (See Comments)    Reaction:  Unknown     VITALS:  Blood pressure 156/75, pulse 87, temperature 97.9 F (36.6 C), temperature source Oral, resp. rate 16, height 5\' 9"  (1.753 m), weight 124.739 kg (275 lb), last menstrual period 03/16/2015, SpO2 98 %.  PHYSICAL EXAMINATION:  GENERAL:  50 y.o.-year-old patient sitting in the bed with no acute distress. Morbid obese.  HEENT:  Oropharynx and nasopharynx clear.  NECK:  Supple, no jugular venous distention.  LUNGS: CTA, no wheezing, No use of accessory muscles of respiration. No dullness to percussion  CARDIOVASCULAR: S1, S2 normal. No murmurs, rubs, or gallops.  ABDOMEN: Soft, nontender, nondistended. Bowel sounds present. No organomegaly or mass. Peritoneal dailysis cath in right abd. Right  femeral dialysis cath in place.  EXTREMITIES: Mild LE edema Right BKA. NEUROLOGIC: Cranial nerves II through XII are intact. Muscle strength 5/5 in all extremities. Sensation intact. PSYCHIATRIC: The patient is alert and oriented x 3.  SKIN: No obvious rash, lesion, or ulcer.    LABORATORY PANEL:   CBC  Recent Labs Lab 08/28/15 0608  WBC 8.3  HGB 9.7*  HCT 29.8*  PLT 392   ------------------------------------------------------------------------------------------------------------------  Chemistries   Recent Labs Lab 08/23/15 2319  08/26/15 0617  NA 138  < > 134*  K 4.3  < > 4.0  CL 96*  < > 95*  CO2 26  < > 24  GLUCOSE 150*  < > 166*  BUN 75*  < > 75*  CREATININE 10.03*  < > 9.82*  CALCIUM 8.3*  < > 8.0*  AST 13*  --   --   ALT 7*  --   --   ALKPHOS 105  --   --   BILITOT 0.6  --   --   < > = values in this interval not displayed. ------------------------------------------------------------------------------------------------------------------  Cardiac Enzymes No results for input(s): TROPONINI in the last 168 hours. ------------------------------------------------------------------------------------------------------------------  RADIOLOGY:  No results found.  EKG:   Orders placed or performed during the hospital encounter of 08/23/15  . ED EKG  . ED EKG  . EKG 12-Lead  . EKG 12-Lead    ASSESSMENT AND PLAN:    1. Acute encephalopathy, possible due to percocet. Resolved.   2. Sepsis, rule out peritonitis.  Continue IV vancomycin and IV Levaquin,WBC down, blood  cultures and peritoneal fluid analysis and cultures all negative so far. Will continue with abx for now. Consider d/c abx.  3. End-stage renal disease on peritoneal dialysis.  Start HD today per Dr. Cherylann Ratel. Converting to HD. HD today.  4. Hypertension. Low side BP,  imdur and torsemide are on hold.  5.  Anemia of chronic disease. No active bleeding. Transfused 2 units yesterday. HgB up.  Recheck in am. 6. DM. On sliding scale.  PT: SNF.  I discussed with Dr. Cherylann Ratel. All the records are reviewed and case discussed with Care Management/Social Workerr. Management plans discussed with the patient, family and they are in agreement.  CODE STATUS: full code.  TOTAL TIME TAKING CARE OF THIS PATIENT:25 minutes.  POSSIBLE D/C IN 2 DAYS, DEPENDING ON CLINICAL CONDITION.  Time spent= 25 min   Alta Corning.D on 08/28/2015 at 7:52 AM  Between 7am to 6pm - Pager - [phone]  After 6pm go to [email or web address] - password [email or web address] [facility]  [place] [facility]  Office  [phone]  CC: Primary care physician; Jarome Matin, MD

## 2015-08-28 NOTE — Progress Notes (Signed)
TX started 

## 2015-08-28 NOTE — Care Management Important Message (Signed)
Important Message  Patient Details  Name: Kimberly Montgomery MRN: [id number] Date of Birth: 1966/01/11   Medicare Important Message Given:  Yes    [name] A Denyla Cortese 08/28/2015, 1:54 PM

## 2015-08-28 NOTE — Progress Notes (Signed)
Pharmacy Antibiotic Note  Kimberly Montgomery is a 50 y.o. female admitted on 08/23/2015 with sepsis.  Pharmacy has been consulted for Vancomycin and Levofloxacin dosing. Patient previously receiving PD and received a temporary HD cath to start IHD 5/27.   Plan: Patient received Vancomycin 1 gm IV after HD on 5/27. Second HD session today, vancomycin dose sent to dialysis.  Unclear when next HD session will be. Will follow closely to check pre HD trough prior to next session.   Continue Levofloxacin 500 mg IV q48h based on renal function.   Height:  (175.3 cm) Weight: 281 lb 1.4 oz (127.5 kg) IBW/kg (Calculated) : 66.2  Temp (24hrs), Avg:98.5 F (36.9 C), Min:97.9 F (36.6 C), Max:99.3 F (37.4 C)   Recent Labs Lab 08/23/15 2319 08/24/15 0452 08/25/15 0330 08/26/15 0617 08/28/15 0608  WBC 15.2* 12.9* 13.7* 9.4 8.3  CREATININE 10.03* 9.81*  --  9.82*  --   LATICACIDVEN 1.0  --   --   --   --     Estimated Creatinine Clearance: 9.7 mL/min (by C-G formula based on Cr of 9.82).    Allergies  Allergen Reactions  . Cephalosporins Anaphylaxis  . Penicillins Anaphylaxis and Other (See Comments)    Has patient had a PCN reaction causing immediate rash, facial/tongue/throat swelling, SOB or lightheadedness with hypotension: Yes Has patient had a PCN reaction causing severe rash involving mucus membranes or skin necrosis: No Has patient had a PCN reaction that required hospitalization No Has patient had a PCN reaction occurring within the last 10 years: No If all of the above answers are "NO", then may proceed with Cephalosporin use.  . Lamictal [Lamotrigine] Other (See Comments)    Reaction:  Hallucinations  . Phenergan [Promethazine Hcl] Nausea And Vomiting  . Pravastatin Other (See Comments)    Reaction:  Muscle pain   . Sulfa Antibiotics Other (See Comments)    Reaction:  Unknown     Antimicrobials this admission: Anti-infectives    Start     Dose/Rate Route Frequency  Ordered Stop   08/28/15 1500  vancomycin (VANCOCIN) IVPB 1000 mg/200 mL premix     1,000 mg 200 mL/hr over 60 Minutes Intravenous Every Dialysis 08/28/15 1202     08/26/15 1200  vancomycin (VANCOCIN) IVPB 1000 mg/200 mL premix  Status:  Discontinued     1,000 mg 200 mL/hr over 60 Minutes Intravenous Every T-Th-Sa (Hemodialysis) 08/26/15 1034 08/28/15 1202   08/25/15 1800  levofloxacin (LEVAQUIN) IVPB 500 mg     500 mg 100 mL/hr over 60 Minutes Intravenous Every 48 hours 08/24/15 0350     08/25/15 1700  erythromycin (E-MYCIN) tablet 250 mg     250 mg Oral 3 times daily with meals & bedtime 08/25/15 1244     08/24/15 0200  vancomycin (VANCOCIN) IVPB 1000 mg/200 mL premix     1,000 mg 200 mL/hr over 60 Minutes Intravenous  Once 08/24/15 0159 08/24/15 0518   08/24/15 0100  vancomycin (VANCOCIN) IVPB 1000 mg/200 mL premix     1,000 mg 200 mL/hr over 60 Minutes Intravenous  Once 08/24/15 0049 08/24/15 0203   08/24/15 0100  levofloxacin (LEVAQUIN) IVPB 750 mg     750 mg 100 mL/hr over 90 Minutes Intravenous  Once 08/24/15 0049 08/24/15 0243       Dose adjustments this admission: Follow up to schedule trough once HD schedule is known  Microbiology results: Results for orders placed or performed during the hospital encounter of 08/23/15  Culture, blood (routine x 2)     Status: None (Preliminary result)   Collection Time: 08/24/15 12:54 AM  Result Value Ref Range Status   Specimen Description BLOOD RIGHT ARM  Final   Special Requests BOTTLES DRAWN AEROBIC AND ANAEROBIC  Final   Culture NO GROWTH 3 DAYS  Final   Report Status PENDING  Incomplete  Culture, blood (routine x 2)     Status: None (Preliminary result)   Collection Time: 08/24/15 12:54 AM  Result Value Ref Range Status   Specimen Description BLOOD RIGHT ANTECUBITAL  Final   Special Requests BOTTLES DRAWN AEROBIC AND ANAEROBIC  Final   Culture NO GROWTH 3 DAYS  Final   Report Status PENDING  Incomplete  Culture,  body fluid-bottle     Status: None (Preliminary result)   Collection Time: 08/25/15  4:10 PM  Result Value Ref Range Status   Specimen Description PERITONEAL FLUID  Final   Special Requests NONE  Final   Culture NO GROWTH 2 DAYS  Final   Report Status PENDING  Incomplete    Thank you for allowing pharmacy to be a part of this patient's care.  Kimberly Montgomery C 08/28/2015 12:07 PM

## 2015-08-28 NOTE — Progress Notes (Signed)
Pst HD  

## 2015-08-28 NOTE — Care Management (Signed)
Barrier to discharge - started hemo dialysis (from PD) and will go to snf

## 2015-08-28 NOTE — Progress Notes (Signed)
PRE HD TX 

## 2015-08-28 NOTE — Progress Notes (Signed)
Pre HD  

## 2015-08-28 NOTE — Progress Notes (Signed)
[facility]  ROUNDING NOTE   Subjective:  Patient seen and evaluated during dialysis. She is currently tolerating well. Hemoglobin up to 9.7.    HEMODIALYSIS FLOWSHEET:  Blood Flow Rate (mL/min): 400 mL/min Arterial Pressure (mmHg): -200 mmHg Venous Pressure (mmHg): 150 mmHg Transmembrane Pressure (mmHg): 60 mmHg Ultrafiltration Rate (mL/min): 1000 mL/min Dialysate Flow Rate (mL/min): 800 ml/min Conductivity: Machine : 14 Conductivity: Machine : 14 Dialysis Fluid Bolus: Normal Saline Bolus Amount (mL): 250 mL (Prime given) Intra-Hemodialysis Comments: removed   Objective:  Vital signs in last 24 hours:  Temp:  [97.9 F (36.6 C)-99.3 F (37.4 C)] 98 F (36.7 C) (05/29 0945) Pulse Rate:  [84-97] 90 (05/29 1300) Resp:  [12-21] 17 (05/29 1300) BP: (138-159)/(61-77) 148/64 mmHg (05/29 1300) SpO2:  [91 %-100 %] 100 % (05/29 1006) Weight:  [124.739 kg (275 lb)-127.5 kg (281 lb 1.4 oz)] 127.5 kg (281 lb 1.4 oz) (05/29 0945)  Weight change: -1.261 kg (-2 lb 12.5 oz) Filed Weights   08/27/15 0501 08/28/15 0533 08/28/15 0945  Weight: 124.24 kg (273 lb 14.4 oz) 124.739 kg (275 lb) 127.5 kg (281 lb 1.4 oz)    Intake/Output: I/O last 3 completed shifts: In: 1531.4 [P.O.:720; Blood:711.4; IV Piggyback:100] Out: 1050 [Urine:1050]   Intake/Output this shift:     Physical Exam: General: NAD, resting in bed  Head: Normocephalic, atraumatic. Moist oral mucosal membranes  Eyes: Anicteric  Neck: Supple, trachea midline  Lungs:  Clear to auscultation  Heart: Regular rate and rhythm no rubs  Abdomen:  Soft, nontender, BS present  Extremities: 1+ LLE edema, R BKA  Neurologic: Awake, Alert, moving all four extremities  Skin: No lesions  Access: PD catheter in place, no drainage    Basic Metabolic Panel:  Recent Labs Lab 08/23/15 2319 08/24/15 0452 08/26/15 0617  NA 138 138 134*  K 4.3 3.9 4.0  CL 96* 98* 95*  CO2 26 25 24   GLUCOSE 150* 183* 166*   BUN 75* 74* 75*  CREATININE 10.03* 9.81* 9.82*  CALCIUM 8.3* 7.8* 8.0*    Liver Function Tests:  Recent Labs Lab 08/23/15 2319  AST 13*  ALT 7*  ALKPHOS 105  BILITOT 0.6  PROT 5.4*  ALBUMIN 1.5*    Recent Labs Lab 08/23/15 2319  LIPASE 14    Recent Labs Lab 08/23/15 2319  AMMONIA 29    CBC:  Recent Labs Lab 08/23/15 2319 08/24/15 0452 08/25/15 0330 08/26/15 0617 08/27/15 0811 08/28/15 0608  WBC 15.2* 12.9* 13.7* 9.4  --  8.3  HGB 6.9* 6.1* 7.7* 6.6* 6.6* 9.7*  HCT 21.9* 19.4* 23.4* 20.1*  --  29.8*  MCV 87.9 88.1 87.6 89.0  --  87.5  PLT 467* 439 461* 408  --  392    Cardiac Enzymes: No results for input(s): CKTOTAL, CKMB, CKMBINDEX, TROPONINI in the last 168 hours.  BNP: Invalid input(s): POCBNP  CBG:  Recent Labs Lab 08/27/15 0728 08/27/15 1127 08/27/15 1646 08/27/15 2111 08/28/15 0742  GLUCAP 143* 204* 211* 220* 147*    Microbiology: Results for orders placed or performed during the hospital encounter of 08/23/15  Culture, blood (routine x 2)     Status: None (Preliminary result)   Collection Time: 08/24/15 12:54 AM  Result Value Ref Range Status   Specimen Description BLOOD RIGHT ARM  Final   Special Requests BOTTLES DRAWN AEROBIC AND ANAEROBIC  Final   Culture NO GROWTH 4 DAYS  Final   Report Status PENDING  Incomplete  Culture,  blood (routine x 2)     Status: None (Preliminary result)   Collection Time: 08/24/15 12:54 AM  Result Value Ref Range Status   Specimen Description BLOOD RIGHT ANTECUBITAL  Final   Special Requests BOTTLES DRAWN AEROBIC AND ANAEROBIC  Final   Culture NO GROWTH 4 DAYS  Final   Report Status PENDING  Incomplete  Culture, body fluid-bottle     Status: None (Preliminary result)   Collection Time: 08/25/15  4:10 PM  Result Value Ref Range Status   Specimen Description PERITONEAL FLUID  Final   Special Requests NONE  Final   Culture NO GROWTH 3 DAYS  Final   Report Status PENDING  Incomplete     Coagulation Studies: No results for input(s): LABPROT, INR in the last 72 hours.  Urinalysis: No results for input(s): COLORURINE, LABSPEC, PHURINE, GLUCOSEU, HGBUR, BILIRUBINUR, KETONESUR, PROTEINUR, UROBILINOGEN, NITRITE, LEUKOCYTESUR in the last 72 hours.  Invalid input(s): APPERANCEUR    Imaging: No results found.   Medications:   . sodium chloride 20 mL/hr at 08/27/15 1114   . aspirin EC  81 mg Oral Daily  . calcitRIOL  0.25 mcg Oral Q breakfast  . calcium acetate  2,001 mg Oral TID WC  . clopidogrel  75 mg Oral Daily  . dialysis solution 2.5% low-MG/low-CA   Intraperitoneal Q24H  . erythromycin  250 mg Oral TID WC & HS  . ezetimibe  10 mg Oral QHS  . gentamicin cream  1 application Topical Daily  . heparin  5,000 Units Subcutaneous Q8H  . insulin aspart  0-5 Units Subcutaneous QHS  . insulin aspart  0-9 Units Subcutaneous TID WC  . levofloxacin (LEVAQUIN) IV  500 mg Intravenous Q48H  . nicotine  14 mg Transdermal Daily  . nystatin  1 Bottle Topical BID  . pantoprazole  40 mg Oral QAC breakfast  . ranolazine  500 mg Oral BID  . risperiDONE  1 mg Oral QHS  . sodium chloride flush  3 mL Intravenous Q12H   acetaminophen **OR** acetaminophen, albuterol, docusate sodium, lactulose, nitroGLYCERIN, ondansetron **OR** ondansetron (ZOFRAN) IV, oxyCODONE, senna, vancomycin, zinc oxide  Assessment/ Plan:  50 y.o. female with past medical history of end-stage renal disease on peritoneal dialysis, asthma, gastroparesis, diabetes mellitus, congestive heart failure, hypertension, endometriosis, coronary atherosclerosis/medical management, PD peritonitis with micrococcus treated with meropenem and IV/IP vancomycin  [facility]/[address]/peritoneal dialysis  1. End-stage renal disease on peritoneal dialysis. - Now transitioning temporarily back to HD. CCPD- 4 exchanges 8 hours with 2.2 litre fills. 2.5% dextrose  - Patient seen and evaluated during dialysis. She is  tolerating well. Patient has transitioning temporarily to hemodialysis until she gets her strength back. This was discussed with [facility] nephrology. She will need a PermCath to be placed later this week and we have consulted vascular surgery for this.  2.  Leukocytosis:   Pt had recent micrococcus peritonitis at [facility].  - PD fluid culture remains negative. She has been on levofloxacin and vancomycin this admission.   3.  Anemia of CKD:  Hemoglobin up to 9.7 posttransfusion. Continue to monitor CBC.   4.  AMS:  Could be related to excess consumption of percocet in a short period of time, under dialysis also maybe playing a role.  - After having started the patient on hemodialysis her mental status has improved significantly. Continue to monitor.  5.  Depression. The patient has had recent bouts of depression. She reports that she has been unable to take care of  her self and feels like that she is losing control.  Recently she has been on risperidone as well as Abilify.  - Awaiting psychiatry evaluation.   LOS: 4 Kimberly Montgomery 5/29/20171:14 PM

## 2015-08-28 NOTE — Care Management (Signed)
Barrier to discharge- Now receiving hemodialysis through femoral trialysis catheter.  There is conflicting information reported regarding whether hemodialysis is going to be chronic vs returning to peritoneal dialysis.  Trialysis catheter site contraindicates ambulation.

## 2015-08-28 NOTE — Consult Note (Signed)
[facility] Face-to-Face Psychiatry Consult   Reason for Consult:  Consult for 50 year old woman with history of peritoneal dialysis. Referring Physician:  Theodosia Blender, M.D  : Kimberly Montgomery MRN:  [id number] Principal Diagnosis: Bipolar disorder Diagnosis:   Patient Active Problem List   Diagnosis Date Noted  . Altered mental status [R41.82] 08/24/2015  . ESRD (end stage renal disease) (HCC) [N18.6]   . Ileus (HCC) [K56.7]   . Bipolar I disorder (HCC) [F31.9] 07/25/2015  . Seizures (HCC) [R56.9] 07/25/2015  . Peritonitis (HCC) [K65.9] 07/16/2015  . Unstable angina (HCC) [I20.0] 07/09/2015  . ESRD on peritoneal dialysis (HCC) [N18.6, Z99.2] 07/09/2015  . Accelerated hypertension [I10] 07/09/2015  . Type 2 diabetes mellitus (HCC) [E11.9] 07/09/2015  . CAD (coronary artery disease) [I25.10] 07/09/2015  . HLD (hyperlipidemia) [E78.5] 07/09/2015  . GERD (gastroesophageal reflux disease) [K21.9] 07/09/2015  . Chest pain [R07.9] 05/12/2015    Total Time spent with patient: 30 minutes  Subjective:   Kimberly Montgomery is a 50 y.o. female history of bipolar disorder who was admitted due to altered mental status and weakness. She was started on paratonia dialysis. Patient was evaluated in her room. She reported that she has long history of bipolar disorder and she has been taking medications in the past.   HPI:  Patient chart was reviewed and she was interviewed. According to her chart patient has history of paratonia dialysis and renal failure who is currently getting sped all. Patient was tearful during the interview. She reported that she has been doing well on Wellbutrin in the past but it was discontinued due to her history of seizures. She reported that she has tried several other psychotropic medications in the past including Prozac and Wellbutrin but she took the Wellbutrin for approximately 15 years. Patient reported that she continues to feel depressed and anxious and has crying spells. She  reported that she has been diagnosed with bipolar disorder when she was hospitalized about 12 years ago. Patient reported that she continues to have more swings and feels anxious most of this time. Currently she is not seeing any psychiatrist. She reported that she saw Dr. Marguerite Olea 3 months ago. She is open to suggestions and wants to try other medications to help with her anxiety and depression at this time. She currently denied having any suicidal homicidal ideations or plans.  Past psychiatric history  Patient has history of bipolar disorder which was diagnosed when she was hospitalized. She reported that she has been tried on Prozac Wellbutrin and Abilify and Risperdal. She is currently taking Risperdal. She does not remember taking Abilify but her records indicate the same. Patient stated that she has never attempted suicide.  Risk to Self: Is patient at risk for suicide?: No Risk to Others:   Prior Inpatient Therapy:   Prior Outpatient Therapy:    Past Medical History:  Past Medical History  Diagnosis Date  . Renal disorder   . Hypertension   . CHF (congestive heart failure) (HCC)   . Diabetes mellitus without complication (HCC)   . Gastroparesis   . Asthma   . ESRD (end stage renal disease) (HCC)   . Endometriosis   . CAD (coronary artery disease)   . HLD (hyperlipidemia)   . GERD (gastroesophageal reflux disease)     Past Surgical History  Procedure Laterality Date  . Hysterotomy    . Below knee leg amputation    . Pd tube inserstion    . Hd fistula surgery with reversal    .  Cardiac catheterization Right 07/10/2015    Procedure: Left Heart Cath and Coronary Angiography;  Surgeon: Laurier Nancy, MD;  Location: [facility] INVASIVE CV LAB;  Service: Cardiovascular;  Laterality: Right;   Family History:  Family History  Problem Relation Age of Onset  . CAD    . Diabetes    . Bipolar disorder    . Cervical cancer Mother    Family Psychiatric  History: Family history is not  reported Social History:  History  Alcohol Use No     History  Drug Use No    Social History   Social History  . Marital Status: Widowed    Spouse Name: N/A  . Number of Children: N/A  . Years of Education: N/A   Occupational History  . disabled    Social History Main Topics  . Smoking status: Current Every Day Smoker -- 0.50 packs/day    Types: Cigarettes  . Smokeless tobacco: None  . Alcohol Use: No  . Drug Use: No  . Sexual Activity: No   Other Topics Concern  . None   Social History Narrative   Additional Social History:  patient currently lives by herself.  Allergies:   Allergies  Allergen Reactions  . Cephalosporins Anaphylaxis  . Penicillins Anaphylaxis and Other (See Comments)    Has patient had a PCN reaction causing immediate rash, facial/tongue/throat swelling, SOB or lightheadedness with hypotension: Yes Has patient had a PCN reaction causing severe rash involving mucus membranes or skin necrosis: No Has patient had a PCN reaction that required hospitalization No Has patient had a PCN reaction occurring within the last 10 years: No If all of the above answers are "NO", then may proceed with Cephalosporin use.  . Lamictal [Lamotrigine] Other (See Comments)    Reaction:  Hallucinations  . Phenergan [Promethazine Hcl] Nausea And Vomiting  . Pravastatin Other (See Comments)    Reaction:  Muscle pain   . Sulfa Antibiotics Other (See Comments)    Reaction:  Unknown     Labs:  Results for orders placed or performed during the hospital encounter of 08/23/15 (from the past 48 hour(s))  Glucose, capillary     Status: Abnormal   Collection Time: 08/26/15  6:44 PM  Result Value Ref Range   Glucose-Capillary 126 (H) 65 - 99 mg/dL  Glucose, capillary     Status: Abnormal   Collection Time: 08/26/15  9:23 PM  Result Value Ref Range   Glucose-Capillary 227 (H) 65 - 99 mg/dL  Glucose, capillary     Status: Abnormal   Collection Time: 08/27/15  7:28 AM   Result Value Ref Range   Glucose-Capillary 143 (H) 65 - 99 mg/dL   Comment 1 Notify RN   Hemoglobin     Status: Abnormal   Collection Time: 08/27/15  8:11 AM  Result Value Ref Range   Hemoglobin 6.6 (L) 12.0 - 16.0 g/dL  Prepare RBC     Status: None   Collection Time: 08/27/15 10:50 AM  Result Value Ref Range   Order Confirmation ORDER PROCESSED BY BLOOD BANK   Glucose, capillary     Status: Abnormal   Collection Time: 08/27/15 11:27 AM  Result Value Ref Range   Glucose-Capillary 204 (H) 65 - 99 mg/dL   Comment 1 Notify RN   Glucose, capillary     Status: Abnormal   Collection Time: 08/27/15  4:46 PM  Result Value Ref Range   Glucose-Capillary 211 (H) 65 - 99 mg/dL   Comment 1  Notify RN   Glucose, capillary     Status: Abnormal   Collection Time: 08/27/15  9:11 PM  Result Value Ref Range   Glucose-Capillary 220 (H) 65 - 99 mg/dL  CBC     Status: Abnormal   Collection Time: 08/28/15  6:08 AM  Result Value Ref Range   WBC 8.3 3.6 - 11.0 K/uL   RBC 3.41 (L) 3.80 - 5.20 MIL/uL   Hemoglobin 9.7 (L) 12.0 - 16.0 g/dL   HCT 16.1 (L) 09.6 - 04.5 %   MCV 87.5 80.0 - 100.0 fL   MCH 28.6 26.0 - 34.0 pg   MCHC 32.7 32.0 - 36.0 g/dL   RDW 40.9 (H) 81.1 - 91.4 %   Platelets 392 150 - 440 K/uL  Glucose, capillary     Status: Abnormal   Collection Time: 08/28/15  7:42 AM  Result Value Ref Range   Glucose-Capillary 147 (H) 65 - 99 mg/dL   Comment 1 Notify RN     Current Facility-Administered Medications  Medication Dose Route Frequency Provider Last Rate Last Dose  . 0.9 %  sodium chloride infusion   Intravenous Continuous Annice Needy, MD 20 mL/hr at 08/27/15 1114    . acetaminophen (TYLENOL) tablet 650 mg  650 mg Oral Q6H PRN Ihor Austin, MD   650 mg at 08/26/15 1856   Or  . acetaminophen (TYLENOL) suppository 650 mg  650 mg Rectal Q6H PRN Pavan Pyreddy, MD      . albuterol (PROVENTIL) (2.5 MG/3ML) 0.083% nebulizer solution 3 mL  3 mL Inhalation Q6H PRN Ihor Austin, MD      .  aspirin EC tablet 81 mg  81 mg Oral Daily Ihor Austin, MD   81 mg at 08/28/15 0845  . calcitRIOL (ROCALTROL) capsule 0.25 mcg  0.25 mcg Oral Q breakfast Ihor Austin, MD   0.25 mcg at 08/28/15 0844  . calcium acetate (PHOSLO) capsule 2,001 mg  2,001 mg Oral TID WC Ihor Austin, MD   2,001 mg at 08/28/15 0844  . clopidogrel (PLAVIX) tablet 75 mg  75 mg Oral Daily Ihor Austin, MD   75 mg at 08/28/15 0845  . dialysis solution 2.5% low-MG/low-CA dianeal solution   Intraperitoneal Q24H Munsoor Lateef, MD      . docusate sodium (COLACE) capsule 100 mg  100 mg Oral QHS PRN Ihor Austin, MD      . erythromycin (E-MYCIN) tablet 250 mg  250 mg Oral TID WC & HS Munsoor Lateef, MD   250 mg at 08/28/15 0845  . escitalopram (LEXAPRO) tablet 10 mg  10 mg Oral Daily Brandy Hale, MD      . ezetimibe (ZETIA) tablet 10 mg  10 mg Oral QHS Shaune Pollack, MD   10 mg at 08/27/15 2202  . gentamicin cream (GARAMYCIN) 0.1 % 1 application  1 application Topical Daily Munsoor Lateef, MD   1 application at 08/27/15 0902  . heparin injection 5,000 Units  5,000 Units Subcutaneous Q8H Ihor Austin, MD   5,000 Units at 08/28/15 1446  . insulin aspart (novoLOG) injection 0-5 Units  0-5 Units Subcutaneous QHS Shaune Pollack, MD   2 Units at 08/27/15 2203  . insulin aspart (novoLOG) injection 0-9 Units  0-9 Units Subcutaneous TID WC Shaune Pollack, MD   1 Units at 08/28/15 0844  . lactulose (CHRONULAC) 10 GM/15ML solution 10 g  10 g Oral Daily PRN Pavan Pyreddy, MD      . levofloxacin (LEVAQUIN) IVPB 500 mg  500 mg  Intravenous Q48H Ihor Austin, MD   500 mg at 08/27/15 1852  . nicotine (NICODERM CQ - dosed in mg/24 hours) patch 14 mg  14 mg Transdermal Daily Shaune Pollack, MD   14 mg at 08/28/15 0846  . nitroGLYCERIN (NITROSTAT) SL tablet 0.4 mg  0.4 mg Sublingual Q5 min PRN Ihor Austin, MD      . nystatin (MYCOSTATIN) powder 1 Bottle  1 Bottle Topical BID Ihor Austin, MD   Stopped at 08/28/15 1015  . ondansetron (ZOFRAN) tablet 4 mg  4  mg Oral Q6H PRN Ihor Austin, MD       Or  . ondansetron (ZOFRAN) injection 4 mg  4 mg Intravenous Q6H PRN Ihor Austin, MD   4 mg at 08/26/15 0022  . oxyCODONE (Oxy IR/ROXICODONE) immediate release tablet 5 mg  5 mg Oral Q4H PRN Ihor Austin, MD   5 mg at 08/28/15 1445  . pantoprazole (PROTONIX) EC tablet 40 mg  40 mg Oral QAC breakfast Ihor Austin, MD   40 mg at 08/28/15 0845  . ranolazine (RANEXA) 12 hr tablet 500 mg  500 mg Oral BID Ihor Austin, MD   500 mg at 08/28/15 0845  . risperiDONE (RISPERDAL) tablet 1 mg  1 mg Oral QHS Ihor Austin, MD   1 mg at 08/27/15 2202  . senna (SENOKOT) tablet 17.2 mg  2 tablet Oral QHS PRN Pavan Pyreddy, MD      . sodium chloride flush (NS) 0.9 % injection 3 mL  3 mL Intravenous Q12H Ihor Austin, MD   3 mL at 08/27/15 2204  . vancomycin (VANCOCIN) IVPB 1000 mg/200 mL premix  1,000 mg Intravenous Q dialysis Gracelyn Nurse, MD   1,000 mg at 08/28/15 1220  . zinc oxide 20 % ointment   Topical PRN Ihor Austin, MD        Musculoskeletal: Strength & Muscle Tone: decreased Gait & Station: unable to stand Patient leans: N/A  Psychiatric Specialty Exam: Review of Systems  HENT: Negative.   Eyes: Negative.   Respiratory: Negative.   Cardiovascular: Negative.   Gastrointestinal: Negative.   Musculoskeletal: Negative.   Skin: Negative.   Neurological: Positive for speech change and weakness.  Psychiatric/Behavioral: Positive for depression. Negative for suicidal ideas, hallucinations and substance abuse. The patient is nervous/anxious. The patient does not have insomnia.     Blood pressure 158/56, pulse 92, temperature 98.4 F (36.9 C), temperature source Oral, resp. rate 17, height 5\' 9"  (1.753 m), weight 275 lb (124.739 kg), last menstrual period 03/16/2015, SpO2 97 %.Body mass index is 40.59 kg/(m^2).  General Appearance: Casual  Eye Contact::  Minimal  Speech:  Slow and Slurred  Volume:  Decreased  Mood:  Anxious and Depressed  Affect:   Congruent  Thought Process:  Negative  Orientation:  Full (Time, Place, and Person)  Thought Content:  Negative  Suicidal Thoughts:  No  Homicidal Thoughts:  No  Memory:  Negative  Judgement:  Negative  Insight:  Negative  Psychomotor Activity:  Psychomotor Retardation  Concentration:  Poor  Recall:  Poor  Fund of Knowledge:Fair  Language: Fair  Akathisia:  No  Handed:  Right  AIMS (if indicated):     Assets:  Desire for Improvement Financial Resources/Insurance Resilience Social Support  ADL's:  Impaired  Cognition: WNL  Sleep:      Treatment Plan Summary: Daily contact with patient to assess and evaluate symptoms and progress in treatment  Disposition: Supportive therapy provided about ongoing stressors.   Discussed  with patient at length about the medications treatment risks benefits and alternatives I will start her on Lexapro 10 mg daily to help with her depression and anxiety symptoms. She will continue on Risperdal 1 mg by mouth daily at bedtime Patient will follow-up in the outpatient psychiatry at [facility] clinic and she agreed with the plan Thank you for allowing me to participate in the care of this patient  Brandy Hale, MD 08/28/2015 2:51 PM  Physical Exam

## 2015-08-28 NOTE — Progress Notes (Signed)
Tx ended    

## 2015-08-29 DIAGNOSIS — F3130 Bipolar disorder, current episode depressed, mild or moderate severity, unspecified: Secondary | ICD-10-CM | POA: Insufficient documentation

## 2015-08-29 LAB — CULTURE, BODY FLUID W GRAM STAIN -BOTTLE: Culture: NO GROWTH

## 2015-08-29 LAB — CULTURE, BODY FLUID-BOTTLE

## 2015-08-29 LAB — VANCOMYCIN, RANDOM: Vancomycin Rm: 26 ug/mL

## 2015-08-29 LAB — GLUCOSE, CAPILLARY
GLUCOSE-CAPILLARY: 215 mg/dL — AB (ref 65–99)
Glucose-Capillary: 158 mg/dL — ABNORMAL HIGH (ref 65–99)
Glucose-Capillary: 207 mg/dL — ABNORMAL HIGH (ref 65–99)
Glucose-Capillary: 247 mg/dL — ABNORMAL HIGH (ref 65–99)

## 2015-08-29 LAB — CULTURE, BLOOD (ROUTINE X 2)
CULTURE: NO GROWTH
CULTURE: NO GROWTH

## 2015-08-29 LAB — HEPATITIS B SURFACE ANTIGEN: Hepatitis B Surface Ag: NEGATIVE

## 2015-08-29 LAB — HEPATITIS B SURFACE ANTIBODY, QUANTITATIVE: Hepatitis B-Post: 50.3 m[IU]/mL (ref >9.9)

## 2015-08-29 MED ORDER — TORSEMIDE 20 MG PO TABS
100.0000 mg | ORAL_TABLET | Freq: Every day | ORAL | Status: DC
  Administered 2015-08-29 – 2015-08-31 (×2): 100 mg via ORAL
  Filled 2015-08-29 (×2): qty 5

## 2015-08-29 MED ORDER — INSULIN DETEMIR 100 UNIT/ML ~~LOC~~ SOLN
8.0000 [IU] | Freq: Every day | SUBCUTANEOUS | Status: DC
  Administered 2015-08-29 – 2015-08-30 (×2): 8 [IU] via SUBCUTANEOUS
  Filled 2015-08-29 (×4): qty 0.08

## 2015-08-29 MED ORDER — ISOSORBIDE MONONITRATE ER 60 MG PO TB24
60.0000 mg | ORAL_TABLET | Freq: Every day | ORAL | Status: DC
  Administered 2015-08-29 – 2015-08-31 (×2): 60 mg via ORAL
  Filled 2015-08-29 (×3): qty 1

## 2015-08-29 NOTE — Progress Notes (Signed)
Inpatient Diabetes Program Recommendations  AACE/ADA: New Consensus Statement on Inpatient Glycemic Control (2015)  Target Ranges:  Prepandial:   less than 140 mg/dL      Peak postprandial:   less than 180 mg/dL (1-2 hours)      Critically ill patients:  140 - 180 mg/dL  Results for Kimberly Montgomery, Kimberly Montgomery (MRN [id number]) as of 08/29/2015 10:57  Ref. Range 08/28/2015 07:42 08/28/2015 16:29 08/28/2015 21:34 08/29/2015 07:54  Glucose-Capillary Latest Ref Range: 65-99 mg/dL 974 (H) 163 (H) 845 (H) 158 (H)   Review of Glycemic Control  Diabetes history: DM2 hx Outpatient Diabetes medications: Lantus 5 units QHS, Novolog 15 units TID with meals, Novolog 5 units with snacks Current orders for Inpatient glycemic control: Novolog 0-9 units TID with meals, Novolog 0-5 units QHS  Inpatient Diabetes Program Recommendations: Insulin - Meal Coverage: Please consider ordering Novolog 3 units TID with meals for meal coverage if patient eats at least 50% of meals.  Thanks, Orlando Penner, RN, MSN, CDE Diabetes Coordinator Inpatient Diabetes Program [phone] (Team Pager from 8am to 5pm) [phone] ([facility] office) [phone] [facility] office) [phone] [facility] office)

## 2015-08-29 NOTE — Progress Notes (Signed)
[facility] [place] [facility]   PATIENT NAME: Kimberly Montgomery    MR#:  [id number]  DATE OF BIRTH:  [birth year]  SUBJECTIVE:  CHIEF COMPLAINT:   Chief Complaint  Patient presents with  . Altered Mental Status  No omplaints today  REVIEW OF SYSTEMS:  CONSTITUTIONAL: No fever,  CARDIOVASCULAR: No chest pain, orthopnea, edema.  GI - no nausea or vomiting or diarrhea Psychiatry - alert awake  DRUG ALLERGIES:   Allergies  Allergen Reactions  . Cephalosporins Anaphylaxis  . Penicillins Anaphylaxis and Other (See Comments)    Has patient had a PCN reaction causing immediate rash, facial/tongue/throat swelling, SOB or lightheadedness with hypotension: Yes Has patient had a PCN reaction causing severe rash involving mucus membranes or skin necrosis: No Has patient had a PCN reaction that required hospitalization No Has patient had a PCN reaction occurring within the last 10 years: No If all of the above answers are "NO", then may proceed with Cephalosporin use.  . Lamictal [Lamotrigine] Other (See Comments)    Reaction:  Hallucinations  . Phenergan [Promethazine Hcl] Nausea And Vomiting  . Pravastatin Other (See Comments)    Reaction:  Muscle pain   . Sulfa Antibiotics Other (See Comments)    Reaction:  Unknown     VITALS:  Blood pressure 152/66, pulse 90, temperature 98.4 F (36.9 C), temperature source Oral, resp. rate 16, height 5\' 9"  (1.753 m), weight 124.739 kg (275 lb), last menstrual period 03/16/2015, SpO2 99 %.  PHYSICAL EXAMINATION:  GENERAL:  50 y.o.-year-old patient sitting in the bed with no acute distress. Morbid obese.  HEENT:  Oropharynx and nasopharynx clear.  NECK:  Supple, no jugular venous distention.  LUNGS: CTA, no wheezing, No use of accessory muscles of respiration. No dullness to percussion  CARDIOVASCULAR: S1, S2 normal. No murmurs, rubs, or gallops.  ABDOMEN: Soft, nontender, nondistended. Bowel sounds present. No  organomegaly or mass. Peritoneal dailysis cath in right abd. Right femeral dialysis cath in place.  EXTREMITIES: Mild LE edema Right BKA. NEUROLOGIC: Cranial nerves II through XII are intact. Muscle strength 5/5 in all extremities. Sensation intact. PSYCHIATRIC: The patient is alert and oriented x 3.  SKIN: No obvious rash, lesion, or ulcer.    LABORATORY PANEL:   CBC  Recent Labs Lab 08/28/15 0608  WBC 8.3  HGB 9.7*  HCT 29.8*  PLT 392   ------------------------------------------------------------------------------------------------------------------  Chemistries   Recent Labs Lab 08/23/15 2319  08/26/15 0617  NA 138  < > 134*  K 4.3  < > 4.0  CL 96*  < > 95*  CO2 26  < > 24  GLUCOSE 150*  < > 166*  BUN 75*  < > 75*  CREATININE 10.03*  < > 9.82*  CALCIUM 8.3*  < > 8.0*  AST 13*  --   --   ALT 7*  --   --   ALKPHOS 105  --   --   BILITOT 0.6  --   --   < > = values in this interval not displayed. ------------------------------------------------------------------------------------------------------------------  Cardiac Enzymes No results for input(s): TROPONINI in the last 168 hours. ------------------------------------------------------------------------------------------------------------------  RADIOLOGY:  No results found.  EKG:   Orders placed or performed during the hospital encounter of 08/23/15  . ED EKG  . ED EKG  . EKG 12-Lead  . EKG 12-Lead    ASSESSMENT AND PLAN:    1. Acute encephalopathy, possible due to percocet and inadequate dialysis. Resolved.  2. Sepsis,  rule out peritonitis.  Continue IV vancomycin and IV Levaquin,WBC down, blood cultures and peritoneal fluid analysis and cultures all negative so far. We'll stop vancomycin today.  3. End-stage renal disease on peritoneal dialysis.  Converting to HD.  Discussed with Dr. Wynelle Link. Vascular surgery consulted for home Placement.  4. Hypertension.  Blood pressure improved. We will  restart him door and torsemide.  5.  Anemia of chronic disease. No active bleeding. Transfused 2 units . HgB up.   6. DM. On sliding scale.  PT: SNF.  All the records are reviewed and case discussed with Care Management/Social Workerr. Management plans discussed with the patient, family and they are in agreement.  CODE STATUS: full code.  TOTAL TIME TAKING CARE OF THIS PATIENT:25 minutes.   Discharge tomorrow after PermCath placement  Time spent= 25 min   Milagros Loll R M.D on 08/29/2015 at 2:20 PM  Between 7am to 6pm - Pager - [phone]  After 6pm go to [email or web address] - password [email or web address] [facility]  [facility]  Office  [phone]  CC: Primary care physician; Jarome Matin, MD

## 2015-08-29 NOTE — Progress Notes (Signed)
[facility]  ROUNDING NOTE   Subjective:   Hemodialysis yesterday. Tolerated treatment well.   Objective:  Vital signs in last 24 hours:  Temp:  [98.4 F (36.9 C)-99 F (37.2 C)] 98.4 F (36.9 C) (05/30 1400) Pulse Rate:  [88-94] 90 (05/30 1400) Resp:  [16-18] 16 (05/30 0455) BP: (147-162)/(60-66) 152/66 mmHg (05/30 1400) SpO2:  [93 %-99 %] 99 % (05/30 1400) Weight:  [124.739 kg (275 lb)] 124.739 kg (275 lb) (05/30 0500)  Weight change: 2.761 kg (6 lb 1.4 oz) Filed Weights   08/28/15 0945 08/28/15 1353 08/29/15 0500  Weight: 127.5 kg (281 lb 1.4 oz) 124.739 kg (275 lb) 124.739 kg (275 lb)    Intake/Output: I/O last 3 completed shifts: In: 460 [P.O.:360; IV Piggyback:100] Out: 3875 [Urine:1375; Other:2500]   Intake/Output this shift:  Total I/O In: 240 [P.O.:240] Out: -   Physical Exam: General: NAD, sitting in chair  Head: Normocephalic, atraumatic. Moist oral mucosal membranes  Eyes: Anicteric  Neck: Supple, trachea midline  Lungs:  Clear to auscultation  Heart: Regular rate and rhythm no rubs  Abdomen:  Soft, nontender, BS present  Extremities: trace LLE edema, R BKA  Neurologic: Awake, Alert, moving all four extremities  Skin: No lesions  Access: PD catheter in place, Right femoral HD catheter 5/26    Basic Metabolic Panel:  Recent Labs Lab 08/23/15 2319 08/24/15 0452 08/26/15 0617  NA 138 138 134*  K 4.3 3.9 4.0  CL 96* 98* 95*  CO2 GLUCOSE 150* 183* 166*  BUN 75* 74* 75*  CREATININE 10.03* 9.81* 9.82*  CALCIUM 8.3* 7.8* 8.0*    Liver Function Tests:  Recent Labs Lab 08/23/15 2319  AST 13*  ALT 7*  ALKPHOS 105  BILITOT 0.6  PROT 5.4*  ALBUMIN 1.5*    Recent Labs Lab 08/23/15 2319  LIPASE 14    Recent Labs Lab 08/23/15 2319  AMMONIA 29    CBC:  Recent Labs Lab 08/23/15 2319 08/24/15 0452 08/25/15 0330 08/26/15 0617 08/27/15 0811 08/28/15 0608  WBC 15.2* 12.9* 13.7* 9.4  --  8.3  HGB 6.9*  6.1* 7.7* 6.6* 6.6* 9.7*  HCT 21.9* 19.4* 23.4* 20.1*  --  29.8*  MCV 87.9 88.1 87.6 89.0  --  87.5  PLT 467* 439 461* 408  --  392    Cardiac Enzymes: No results for input(s): CKTOTAL, CKMB, CKMBINDEX, TROPONINI in the last 168 hours.  BNP: Invalid input(s): POCBNP  CBG:  Recent Labs Lab 08/28/15 0742 08/28/15 1629 08/28/15 2134 08/29/15 0754 08/29/15 1135  GLUCAP 147* 245* 222* 158* 207*    Microbiology: Results for orders placed or performed during the hospital encounter of 08/23/15  Culture, blood (routine x 2)     Status: None   Collection Time: 08/24/15 12:54 AM  Result Value Ref Range Status   Specimen Description BLOOD RIGHT ARM  Final   Special Requests BOTTLES DRAWN AEROBIC AND ANAEROBIC  Final   Culture NO GROWTH 5 DAYS  Final   Report Status 08/29/2015 FINAL  Final  Culture, blood (routine x 2)     Status: None   Collection Time: 08/24/15 12:54 AM  Result Value Ref Range Status   Specimen Description BLOOD RIGHT ANTECUBITAL  Final   Special Requests BOTTLES DRAWN AEROBIC AND ANAEROBIC  Final   Culture NO GROWTH 5 DAYS  Final   Report Status 08/29/2015 FINAL  Final  Culture, body fluid-bottle     Status: None  Collection Time: 08/25/15  4:10 PM  Result Value Ref Range Status   Specimen Description PERITONEAL FLUID  Final   Special Requests NONE  Final   Culture NO GROWTH 4 DAYS  Final   Report Status 08/29/2015 FINAL  Final    Coagulation Studies: No results for input(s): LABPROT, INR in the last 72 hours.  Urinalysis: No results for input(s): COLORURINE, LABSPEC, PHURINE, GLUCOSEU, HGBUR, BILIRUBINUR, KETONESUR, PROTEINUR, UROBILINOGEN, NITRITE, LEUKOCYTESUR in the last 72 hours.  Invalid input(s): APPERANCEUR    Imaging: No results found.   Medications:   . sodium chloride 20 mL/hr at 08/27/15 1114   . aspirin EC  81 mg Oral Daily  . calcitRIOL  0.25 mcg Oral Q breakfast  . calcium acetate  2,001 mg Oral TID WC  .  clopidogrel  75 mg Oral Daily  . dialysis solution 2.5% low-MG/low-CA   Intraperitoneal Q24H  . erythromycin  250 mg Oral TID WC & HS  . escitalopram  10 mg Oral Daily  . ezetimibe  10 mg Oral QHS  . gentamicin cream  1 application Topical Daily  . heparin  5,000 Units Subcutaneous Q8H  . insulin aspart  0-5 Units Subcutaneous QHS  . insulin aspart  0-9 Units Subcutaneous TID WC  . levofloxacin (LEVAQUIN) IV  500 mg Intravenous Q48H  . nicotine  14 mg Transdermal Daily  . nystatin  1 Bottle Topical BID  . pantoprazole  40 mg Oral QAC breakfast  . ranolazine  500 mg Oral BID  . risperiDONE  1 mg Oral QHS  . sodium chloride flush  3 mL Intravenous Q12H   acetaminophen **OR** acetaminophen, albuterol, docusate sodium, lactulose, nitroGLYCERIN, ondansetron **OR** ondansetron (ZOFRAN) IV, oxyCODONE, senna, vancomycin, zinc oxide  Assessment/ Plan:  50 y.o.white female with past medical history of end-stage renal disease on peritoneal dialysis, asthma, gastroparesis, diabetes mellitus, congestive heart failure, hypertension, endometriosis, coronary atherosclerosis/medical management, PD peritonitis with micrococcus treated with meropenem and IV/IP vancomycin  [facility]/[address]/peritoneal dialysis  1. End-stage renal disease on peritoneal dialysis.  - Now transitioning temporarily back to HD. Next treatment for tomorrow. Will hold PD for now.   2.  Anemia of CKD:   - epo with HD.   3. Hypertension:  - not currently on any agents.   4.  Secondary Hyperparathyroidism:  - calcitriol and calcium acetate.    LOS: 5 Kersten Salmons 5/30/20172:11 PM

## 2015-08-29 NOTE — Progress Notes (Signed)
Physical Therapy Treatment Patient Details Name: Kimberly Montgomery MRN: [id number] DOB: April 16, 1965 Today's Date: 08/29/2015    History of Present Illness Patient is a 50 y/o female that presents with AMS, also found to have anemia, received 1 unit PRBC earlier today and cleared by RN to work with PT. [name] has a lengthy medical history including recent dc from [facility] and [facility], s/p cardiac cath, on peritoneal dialysis. History of seizures, R BKA.     PT Comments    Pt reports soreness on her tailbone and would like to get out of bed. She has a temporary fem cath and hospital policy is bedrest only. Nephrologist contacted who approved transfer from bed to recliner. Pt is very weak and does not have adequate strength to be able to hop with walker utilizing UEs to support body weight. She performs squat pivot and scoot transfer from bed to recliner. Pt currently unable to don prosthesis due to increased swelling in her R stump. RN notified of appropriate transfer precautions for returning to bed. Pt will benefit from skilled PT services to address deficits in strength, balance, and mobility in order to return to full function at home.    Follow Up Recommendations  SNF     Equipment Recommendations  Rolling walker with 5" wheels    Recommendations for Other Services       Precautions / Restrictions Precautions Precautions: Fall Restrictions Weight Bearing Restrictions: No    Mobility  Bed Mobility Overal bed mobility: Needs Assistance Bed Mobility: Supine to Sit     Supine to sit: Min assist     General bed mobility comments: Patient uses bed railings with prolonged time to complete bed mobility, requires PT assistance for UE support.  Transfers Overall transfer level: Needs assistance Equipment used: Rolling walker (2 wheeled) Transfers: Sit to/from Stand Sit to Stand: Max assist         General transfer comment: Pt has trouble standing due to only being able to utilize LUE.  Heavy cues provided for hand placement and sequencing. Once upright attempted hopping with UE but pt unable to perform and is highly fearful of falling. Pt performs squat pivot as well as scooting transfer from bed to recliner  Ambulation/Gait             General Gait Details: Unable to perform at this time   Stairs            Wheelchair Mobility    Modified Rankin (Stroke Patients Only)       Balance Overall balance assessment: Needs assistance Sitting-balance support: No upper extremity supported Sitting balance-Leahy Scale: Good     Standing balance support: Bilateral upper extremity supported Standing balance-Leahy Scale: Poor                      Cognition Arousal/Alertness: Awake/alert Behavior During Therapy: WFL for tasks assessed/performed Overall Cognitive Status: Within Functional Limits for tasks assessed                      Exercises General Exercises - Lower Extremity Quad Sets: Strengthening;Both;10 reps;Supine Gluteal Sets: Strengthening;Both;10 reps;Supine Short Arc Quad: Strengthening;Both;10 reps;Supine Heel Slides: Strengthening;Left;10 reps;Supine Hip ABduction/ADduction: Strengthening;Both;10 reps;Supine Straight Leg Raises: Strengthening;Both;10 reps;Supine    General Comments        Pertinent Vitals/Pain Pain Assessment: 0-10 Pain Score: 4  Pain Location: L hip at site of biopsy Pain Descriptors / Indicators: Burning Pain Intervention(s): Monitored during session;Limited activity within patient's  tolerance;Premedicated before session    Home Living                      Prior Function            PT Goals (current goals can now be found in the care plan section) Acute Rehab PT Goals Patient Stated Goal: To be more independent with mobility.  PT Goal Formulation: With patient Time For Goal Achievement: 09/07/15 Potential to Achieve Goals: Fair Progress towards PT goals: Progressing toward  goals    Frequency  Min 2X/week    PT Plan Current plan remains appropriate    Co-evaluation             End of Session Equipment Utilized During Treatment: Gait belt Activity Tolerance: Patient tolerated treatment well Patient left: in chair;with call bell/phone within reach;with chair alarm set     Time: 5790-3833 PT Time Calculation (min) (ACUTE ONLY): 30 min  Charges:  $Therapeutic Exercise: 8-22 mins $Therapeutic Activity: 8-22 mins                    G Codes:      Sharalyn Ink Shanyiah Conde PT, DPT   Ondine Gemme 08/29/2015, 1:17 PM

## 2015-08-30 ENCOUNTER — Encounter
Admission: EM | Disposition: A | Discharge status: Skilled Nursing Facility | Payer: Self-pay | Source: Home / Self Care | Attending: Internal Medicine

## 2015-08-30 HISTORY — PX: PERIPHERAL VASCULAR CATHETERIZATION: SHX172C

## 2015-08-30 LAB — BASIC METABOLIC PANEL
ANION GAP: 7 (ref 5–15)
BUN: 48 mg/dL — AB (ref 6–20)
CALCIUM: 9.1 mg/dL (ref 8.9–10.3)
CO2: 30 mmol/L (ref 22–32)
CREATININE: 5.55 mg/dL — AB (ref 0.44–1.00)
Chloride: 99 mmol/L — ABNORMAL LOW (ref 101–111)
GFR calc Af Amer: 9 mL/min — ABNORMAL LOW (ref >60)
GFR, EST NON AFRICAN AMERICAN: 8 mL/min — AB (ref >60)
GLUCOSE: 179 mg/dL — AB (ref 65–99)
Potassium: 3.8 mmol/L (ref 3.5–5.1)
Sodium: 136 mmol/L (ref 135–145)

## 2015-08-30 LAB — CBC WITH DIFFERENTIAL/PLATELET
BASOS ABS: 0.1 10*3/uL (ref 0–0.1)
BASOS PCT: 1 %
EOS ABS: 0.4 10*3/uL (ref 0–0.7)
Eosinophils Relative: 5 %
HEMATOCRIT: 25.2 % — AB (ref 35.0–47.0)
HEMOGLOBIN: 8.4 g/dL — AB (ref 12.0–16.0)
LYMPHS PCT: 20 %
Lymphs Abs: 1.5 10*3/uL (ref 1.0–3.6)
MCH: 28.5 pg (ref 26.0–34.0)
MCHC: 33.5 g/dL (ref 32.0–36.0)
MCV: 85.1 fL (ref 80.0–100.0)
MONOS PCT: 16 %
Monocytes Absolute: 1.2 10*3/uL — ABNORMAL HIGH (ref 0.2–0.9)
NEUTROS PCT: 58 %
Neutro Abs: 4.4 10*3/uL (ref 1.4–6.5)
PLATELETS: 336 10*3/uL (ref 150–440)
RBC: 2.95 MIL/uL — ABNORMAL LOW (ref 3.80–5.20)
RDW: 14.4 % (ref 11.5–14.5)
WBC: 7.6 10*3/uL (ref 3.6–11.0)

## 2015-08-30 LAB — GLUCOSE, CAPILLARY
GLUCOSE-CAPILLARY: 144 mg/dL — AB (ref 65–99)
GLUCOSE-CAPILLARY: 132 mg/dL — AB (ref 65–99)
GLUCOSE-CAPILLARY: 177 mg/dL — AB (ref 65–99)

## 2015-08-30 SURGERY — DIALYSIS/PERMA CATHETER INSERTION
Anesthesia: Moderate Sedation

## 2015-08-30 MED ORDER — CHLORHEXIDINE GLUCONATE CLOTH 2 % EX PADS
6.0000 | MEDICATED_PAD | Freq: Once | CUTANEOUS | Status: AC
  Administered 2015-08-30: 6 via TOPICAL

## 2015-08-30 MED ORDER — HEPARIN SODIUM (PORCINE) 10000 UNIT/ML IJ SOLN
INTRAMUSCULAR | Status: AC
  Filled 2015-08-30: qty 1

## 2015-08-30 MED ORDER — BISACODYL 10 MG RE SUPP: 10.0000 mg | Freq: Every day | RECTAL | Status: DC | PRN

## 2015-08-30 MED ORDER — LIDOCAINE-EPINEPHRINE (PF) 1 %-1:200000 IJ SOLN
INTRAMUSCULAR | Status: AC
  Filled 2015-08-30: qty 30

## 2015-08-30 MED ORDER — POLYETHYLENE GLYCOL 3350 17 G PO PACK
17.0000 g | PACK | Freq: Every day | ORAL | Status: DC
  Administered 2015-08-30: 17 g via ORAL
  Filled 2015-08-30: qty 1

## 2015-08-30 MED ORDER — FENTANYL CITRATE (PF) 100 MCG/2ML IJ SOLN
INTRAMUSCULAR | Status: DC | PRN
  Administered 2015-08-30 (×2): 50 ug via INTRAVENOUS

## 2015-08-30 MED ORDER — LACTULOSE 10 GM/15ML PO SOLN
30.0000 g | Freq: Once | ORAL | Status: AC
  Administered 2015-08-30: 30 g via ORAL
  Filled 2015-08-30: qty 60

## 2015-08-30 MED ORDER — OXYCODONE HCL 5 MG PO TABS: 5.0000 mg | ORAL_TABLET | Freq: Four times a day (QID) | ORAL | Status: DC | PRN

## 2015-08-30 MED ORDER — FENTANYL CITRATE (PF) 100 MCG/2ML IJ SOLN
INTRAMUSCULAR | Status: AC
  Filled 2015-08-30: qty 2

## 2015-08-30 MED ORDER — LIDOCAINE-EPINEPHRINE (PF) 1 %-1:200000 IJ SOLN
INTRAMUSCULAR | Status: DC | PRN
  Administered 2015-08-30: 10 mL via INTRADERMAL

## 2015-08-30 MED ORDER — CLINDAMYCIN PHOSPHATE 300 MG/50ML IV SOLN
INTRAVENOUS | Status: AC
  Administered 2015-08-30: 14:00:00
  Filled 2015-08-30: qty 50

## 2015-08-30 MED ORDER — INSULIN GLARGINE 100 UNIT/ML ~~LOC~~ SOLN: 8.0000 [IU] | Freq: Every day | SUBCUTANEOUS | Status: DC

## 2015-08-30 MED ORDER — HEPARIN (PORCINE) IN NACL 2-0.9 UNIT/ML-% IJ SOLN
INTRAMUSCULAR | Status: AC
  Filled 2015-08-30: qty 500

## 2015-08-30 MED ORDER — MIDAZOLAM HCL 5 MG/5ML IJ SOLN
INTRAMUSCULAR | Status: AC
  Filled 2015-08-30: qty 5

## 2015-08-30 MED ORDER — LISINOPRIL 20 MG PO TABS
40.0000 mg | ORAL_TABLET | Freq: Every day | ORAL | Status: DC
  Administered 2015-08-30 – 2015-08-31 (×2): 40 mg via ORAL
  Filled 2015-08-30 (×2): qty 2

## 2015-08-30 MED ORDER — MIDAZOLAM HCL 2 MG/2ML IJ SOLN
INTRAMUSCULAR | Status: DC | PRN
  Administered 2015-08-30: 2 mg via INTRAVENOUS

## 2015-08-30 SURGICAL SUPPLY — 3 items
CATH PALINDROME RT-P 15FX19CM (CATHETERS) ×6 IMPLANT
PACK ANGIOGRAPHY (CUSTOM PROCEDURE TRAY) ×3 IMPLANT
TOWEL OR 17X26 4PK STRL BLUE (TOWEL DISPOSABLE) ×3 IMPLANT

## 2015-08-30 NOTE — Progress Notes (Signed)
Pre-hd tx 

## 2015-08-30 NOTE — Care Management (Signed)
Patient will receive HD through new perm cath and discharge to [facility] 6/1.  Has not had had BM in 6 days.  Nursing aware and intervening

## 2015-08-30 NOTE — Discharge Instructions (Signed)
°  DIET:  Diabetic diet and Renal diet  DISCHARGE CONDITION:  Stable  ACTIVITY:  Activity as tolerated  OXYGEN:  Home Oxygen: No.   Oxygen Delivery: room air  DISCHARGE LOCATION:  nursing home   If you experience worsening of your admission symptoms, develop shortness of breath, life threatening emergency, suicidal or homicidal thoughts you must seek medical attention immediately by calling 911 or calling your MD immediately  if symptoms less severe.  You Must read complete instructions/literature along with all the possible adverse reactions/side effects for all the Medicines you take and that have been prescribed to you. Take any new Medicines after you have completely understood and accpet all the possible adverse reactions/side effects.   Please note  You were cared for by a hospitalist during your hospital stay. If you have any questions about your discharge medications or the care you received while you were in the hospital after you are discharged, you can call the unit and asked to speak with the hospitalist on call if the hospitalist that took care of you is not available. Once you are discharged, your primary care physician will handle any further medical issues. Please note that NO REFILLS for any discharge medications will be authorized once you are discharged, as it is imperative that you return to your primary care physician (or establish a relationship with a primary care physician if you do not have one) for your aftercare needs so that they can reassess your need for medications and monitor your lab values.

## 2015-08-30 NOTE — Progress Notes (Signed)
Verbal order to discontinue right femoral temporary hemodialysis catheter per Dr. Elpidio Anis.

## 2015-08-30 NOTE — Care Management (Signed)
Attending says that patient is medically stable for discharge if gets perm cath inserted.  Discussed if there was need to access perm cath and use prior to discharge and Dr Elpidio Anis says he will discuss with nephrology.  Has dialysis chair at [facility] on [address] for Tues Thurs Sat at 2:30p.  CSW aware

## 2015-08-30 NOTE — Progress Notes (Signed)
Post dialysis 

## 2015-08-30 NOTE — Progress Notes (Signed)
Patient alert and oriented. Reporting no pain. Right chest dressing clean dry intact, perm cath in place. Vital signs stable. Report called to Ree Kida, will return to assigned room shortly.

## 2015-08-30 NOTE — Discharge Summary (Addendum)
[facility] [place] at [facility]   PATIENT NAME: Kimberly Montgomery    MR#:  [id number]  DATE OF BIRTH:  January 28, 1966  DATE OF ADMISSION:  08/23/2015 ADMITTING PHYSICIAN: Ihor Austin, MD  DATE OF DISCHARGE: 08/31/2015  PRIMARY CARE PHYSICIAN: DALE, Jordan Hawks, MD   ADMISSION DIAGNOSIS:  Peritonitis (HCC) [K65.9] Hypoxia [R09.02] ESRD (end stage renal disease) (HCC) [N18.6] ESRD on peritoneal dialysis (HCC) [N18.6, Z99.2] Type 2 diabetes mellitus with chronic kidney disease on chronic dialysis, unspecified long term insulin use status (HCC) [E11.22, N18.6]  DISCHARGE DIAGNOSIS:  Principal Problem:   Altered mental status Active Problems:   Bipolar I disorder, most recent episode depressed (HCC)   SECONDARY DIAGNOSIS:   Past Medical History  Diagnosis Date  . Renal disorder   . Hypertension   . CHF (congestive heart failure) (HCC)   . Diabetes mellitus without complication (HCC)   . Gastroparesis   . Asthma   . ESRD (end stage renal disease) (HCC)   . Endometriosis   . CAD (coronary artery disease)   . HLD (hyperlipidemia)   . GERD (gastroesophageal reflux disease)      ADMITTING HISTORY  HISTORY OF PRESENT ILLNESS: Kimberly Montgomery is a 50 y.o. female with a known history of End-stage renal disease on peritoneal dialysis, hypertension, congestive heart failure, diabetes mellitus, coronary artery disease, hyperlipidemia was recently discharged from [facility]. Patient again came back to the emergency room at our hospital for lethargy and confusion. [name] was not available at bedside. Patient is awake and responds to verbal commands but not completely oriented. She is lethargic and sleepy. Last bowel movement was sent yesterday morning. Patient was given IV Narcan in the emergency room. Plan was to send peritoneal fluid for analysis to evaluate for any peritonitis. Patient was started on broad-spectrum IV antibiotics and her WBC  count was little elevated. Patient recently completed course of vancomycin antibiotic after hospitalization at [facility]. Patient has history of anemia of chronic disease hemoglobin around 6.9. Not much history could be obtained from the patient. In the emergency room patient's oxygen saturation also dropped she was put on oxygen via nasal cannula. Her WBC count during the workup is 15,000. Ammonia level during workup is normal.  HOSPITAL COURSE:   1. Acute encephalopathy, possible due to percocet and inadequate dialysis. Resolved.  2. Sepsis, rule out peritonitis.  Patient was treated with vancomycin and Levaquin. Cultures from peritoneal fluid and blood cultures negative. Antibiotics stopped. Afebrile and normal WBC.  3. End-stage renal disease on peritoneal dialysis.  Converting to HD.  Discussed with Dr. Wynelle Link.  Permacath placed. Patient set up for hemodialysis on Tuesday, Thursday, Saturday.  4. Hypertension.  On Imdur, lisinopril and torsemide  5. Anemia of chronic disease. No active bleeding. Transfused 2 units . HgB stable.   6. DM. On sliding scale.  Patient stable for discharge to rehabilitation.  CONSULTS OBTAINED:  Treatment Team:  Mady Haagensen, MD Annice Needy, MD Brandy Hale, MD  DRUG ALLERGIES:   Allergies  Allergen Reactions  . Cephalosporins Anaphylaxis  . Penicillins Anaphylaxis and Other (See Comments)    Has patient had a PCN reaction causing immediate rash, facial/tongue/throat swelling, SOB or lightheadedness with hypotension: Yes Has patient had a PCN reaction causing severe rash involving mucus membranes or skin necrosis: No Has patient had a PCN reaction that required hospitalization No Has patient had a PCN reaction occurring within the last 10 years: No If all of  the above answers are "NO", then may proceed with Cephalosporin use.  . Lamictal [Lamotrigine] Other (See Comments)    Reaction:  Hallucinations  . Phenergan  [Promethazine Hcl] Nausea And Vomiting  . Pravastatin Other (See Comments)    Reaction:  Muscle pain   . Sulfa Antibiotics Other (See Comments)    Reaction:  Unknown     DISCHARGE MEDICATIONS:   Current Discharge Medication List    CONTINUE these medications which have CHANGED   Details  insulin glargine (LANTUS) 100 UNIT/ML injection Inject 0.08 mLs (8 Units total) into the skin at bedtime. Qty: 10 mL, Refills: 11    oxyCODONE (OXY IR/ROXICODONE) 5 MG immediate release tablet Take 1 tablet (5 mg total) by mouth every 6 (six) hours as needed for severe pain. Qty: 30 tablet, Refills: 0      CONTINUE these medications which have NOT CHANGED   Details  acetaminophen (TYLENOL) 325 MG tablet Take 650 mg by mouth every 6 (six) hours as needed.    albuterol (PROVENTIL HFA;VENTOLIN HFA) 108 (90 Base) MCG/ACT inhaler Inhale 2 puffs into the lungs every 6 (six) hours as needed for wheezing or shortness of breath.    aspirin EC 81 MG tablet Take 81 mg by mouth daily.    calcitRIOL (ROCALTROL) 0.25 MCG capsule Take 0.25 mcg by mouth daily.    calcium acetate, Phos Binder, (PHOSLYRA) 667 MG/5ML SOLN Take 667 mg by mouth 2 (two) times daily. With snacks    clopidogrel (PLAVIX) 75 MG tablet Take 75 mg by mouth daily.    divalproex (DEPAKOTE) 500 MG DR tablet Take 1,000 mg by mouth 3 (three) times daily.    docusate sodium (COLACE) 100 MG capsule Take 100 mg by mouth at bedtime as needed for mild constipation.    erythromycin (E-MYCIN) 250 MG tablet Take 250 mg by mouth 4 (four) times daily.    ezetimibe-simvastatin (VYTORIN) 10-40 MG tablet Take 1 tablet by mouth at bedtime.    gentamicin cream (GARAMYCIN) 0.1 % Apply 1 application topically every morning.    insulin aspart (NOVOLOG) 100 UNIT/ML injection Inject 5-15 Units into the skin 3 (three) times daily with meals. 15 units three times a day with meals, 5 units with snacks    lactulose (CHRONULAC) 10 GM/15ML solution Take 10 g by  mouth daily as needed for mild constipation.    levETIRAcetam (KEPPRA) 500 MG tablet Take 1 tablet (500 mg total) by mouth every morning. Qty: 30 tablet, Refills: 0    lisinopril (PRINIVIL,ZESTRIL) 40 MG tablet Take 40 mg by mouth daily. Refills: 3    metolazone (ZAROXOLYN) 5 MG tablet Take 5 mg by mouth daily as needed (fluid).    nitroGLYCERIN (NITROSTAT) 0.4 MG SL tablet Place 0.4 mg under the tongue every 5 (five) minutes as needed for chest pain. Reported on 07/10/2015    nystatin (NYSTATIN) powder Apply 1 Bottle topically 2 (two) times daily. Qty: 15 g, Refills: 0    omeprazole (PRILOSEC) 40 MG capsule Take 40 mg by mouth 2 (two) times daily.    ondansetron (ZOFRAN ODT) 4 MG disintegrating tablet Take 1 tablet (4 mg total) by mouth every 6 (six) hours as needed for nausea or vomiting. Qty: 20 tablet, Refills: 0    polyethylene glycol (MIRALAX / GLYCOLAX) packet Take 17 g by mouth daily. Qty: 14 each, Refills: 0    risperiDONE (RISPERDAL) 1 MG tablet Take 1 mg by mouth at bedtime.    senna (SENOKOT) 8.6 MG TABS tablet  Take 2 tablets by mouth at bedtime as needed for mild constipation.    torsemide (DEMADEX) 100 MG tablet Take 100 mg by mouth daily.    zinc oxide 20 % ointment Apply topically as needed for irritation. Qty: 56.7 g, Refills: 0    ARIPiprazole (ABILIFY) 2 MG tablet Take 1 tablet (2 mg total) by mouth daily. Qty: 30 tablet, Refills: 0    calcium acetate (PHOSLO) 667 MG capsule Take 3 capsules (2,001 mg total) by mouth 3 (three) times daily with meals. Qty: 90 capsule, Refills: 0    calcium carbonate (TUMS - DOSED IN MG ELEMENTAL CALCIUM) 500 MG chewable tablet Chew 1 tablet (200 mg of elemental calcium total) by mouth 3 (three) times daily with meals. Qty: 120 tablet, Refills: 0    isosorbide mononitrate (IMDUR) 60 MG 24 hr tablet Take 1 tablet (60 mg total) by mouth daily. Qty: 30 tablet, Refills: 00    metoCLOPramide (REGLAN) 10 MG tablet Take 1 tablet  (10 mg total) by mouth 4 (four) times daily. Qty: 90 tablet, Refills: 0    ranolazine (RANEXA) 500 MG 12 hr tablet Take 1 tablet (500 mg total) by mouth 2 (two) times daily. Qty: 60 tablet, Refills: 0      STOP taking these medications     collagenase (SANTYL) ointment      LORazepam (ATIVAN) 2 MG/ML injection      oxyCODONE-acetaminophen (PERCOCET/ROXICET) 5-325 MG tablet         Today   VITAL SIGNS:  Blood pressure 164/64, pulse 89, temperature 98.4 F (36.9 C), temperature source Oral, resp. rate 19, height 5\' 9"  (1.753 m), weight 121.972 kg (268 lb 14.4 oz), last menstrual period 03/16/2015, SpO2 97 %.  I/O:    Intake/Output Summary (Last 24 hours) at 08/31/15 0840 Last data filed at 08/31/15 1610  Gross per 24 hour  Intake      0 ml  Output   2500 ml  Net  -2500 ml    PHYSICAL EXAMINATION:  Physical Exam  GENERAL:  50 y.o.-year-old patient lying in the bed with no acute distress.  LUNGS: Normal breath sounds bilaterally, no wheezing, rales,rhonchi or crepitation. No use of accessory muscles of respiration.  CARDIOVASCULAR: S1, S2 normal. No murmurs, rubs, or gallops.  ABDOMEN: Soft, non-tender, non-distended. Bowel sounds present. No organomegaly or mass.  NEUROLOGIC: Moves all 4 extremities. R BKA PSYCHIATRIC: The patient is alert and oriented x 3.    PermCath for dialysis in Place  DATA REVIEW:   CBC  Recent Labs Lab 08/30/15 0335  WBC 7.6  HGB 8.4*  HCT 25.2*  PLT 336    Chemistries   Recent Labs Lab 08/30/15 0335  NA 136  K 3.8  CL 99*  CO2 30  GLUCOSE 179*  BUN 48*  CREATININE 5.55*  CALCIUM 9.1    Cardiac Enzymes No results for input(s): TROPONINI in the last 168 hours.  Microbiology Results  Results for orders placed or performed during the hospital encounter of 08/23/15  Culture, blood (routine x 2)     Status: None   Collection Time: 08/24/15 12:54 AM  Result Value Ref Range Status   Specimen Description BLOOD RIGHT ARM   Final   Special Requests BOTTLES DRAWN AEROBIC AND ANAEROBIC  Final   Culture NO GROWTH 5 DAYS  Final   Report Status 08/29/2015 FINAL  Final  Culture, blood (routine x 2)     Status: None   Collection Time: 08/24/15 12:54 AM  Result Value Ref Range Status   Specimen Description BLOOD RIGHT ANTECUBITAL  Final   Special Requests BOTTLES DRAWN AEROBIC AND ANAEROBIC  Final   Culture NO GROWTH 5 DAYS  Final   Report Status 08/29/2015 FINAL  Final  Culture, body fluid-bottle     Status: None   Collection Time: 08/25/15  4:10 PM  Result Value Ref Range Status   Specimen Description PERITONEAL FLUID  Final   Special Requests NONE  Final   Culture NO GROWTH 4 DAYS  Final   Report Status 08/29/2015 FINAL  Final    RADIOLOGY:  No results found.  Follow up with PCP in 1 week.  Management plans discussed with the patient, family and they are in agreement.  CODE STATUS:     Code Status Orders        Start     Ordered   08/24/15 0351  Full code   Continuous     08/24/15 0350    Code Status History    Date Active Date Inactive Code Status Order ID Comments User Context   07/16/2015  6:26 AM 08/02/2015  6:48 PM Full Code [id number]  Ihor Austin, MD ED   07/10/2015  1:49 AM 07/12/2015  1:01 PM Full Code [id number]  Oralia Manis, MD Inpatient   05/12/2015  3:46 PM 05/16/2015  5:35 PM Full Code [id number]  Auburn Bilberry, MD ED      TOTAL TIME TAKING CARE OF THIS PATIENT ON DAY OF DISCHARGE: more than 30 minutes.   Milagros Loll R M.D on 08/31/2015 at 8.40 AM  Between 7am to 6pm - Pager - [phone]  After 6pm go to [email or web address] - password [email or web address] [facility]  [place] [facility]  Office  [phone]  CC: Primary care physician; Jarome Matin, MD  Note: This dictation was prepared with Dragon dictation along with smaller phrase technology. Any transcriptional errors that result from this process are unintentional.

## 2015-08-30 NOTE — Progress Notes (Signed)
PT Cancellation Note  Patient Details Name: Kimberly Montgomery MRN: [id number] DOB: 1965/08/19   Cancelled Treatment:    Reason Eval/Treat Not Completed: Patient at procedure or test/unavailable. Patient is currently at dialysis and unavailable to participate in therapy. PT will continue to follow and attempt as patient is available.   Kerin Ransom, PT, DPT    08/30/2015, 11:29 AM

## 2015-08-30 NOTE — Progress Notes (Signed)
Tx complete  

## 2015-08-30 NOTE — Progress Notes (Signed)
[facility] - [place] at [facility]   PATIENT NAME: Kimberly Montgomery    MR#:  [id number]  DATE OF BIRTH:  28-Mar-1966  SUBJECTIVE:  CHIEF COMPLAINT:   Chief Complaint  Patient presents with  . Altered Mental Status  No complaints today . Had percath placed. Constipated REVIEW OF SYSTEMS:  CONSTITUTIONAL: No fever,  CARDIOVASCULAR: No chest pain, orthopnea, edema.  GI - no nausea or vomiting or diarrhea. Psychiatry - alert awake  DRUG ALLERGIES:   Allergies  Allergen Reactions  . Cephalosporins Anaphylaxis  . Penicillins Anaphylaxis and Other (See Comments)    Has patient had a PCN reaction causing immediate rash, facial/tongue/throat swelling, SOB or lightheadedness with hypotension: Yes Has patient had a PCN reaction causing severe rash involving mucus membranes or skin necrosis: No Has patient had a PCN reaction that required hospitalization No Has patient had a PCN reaction occurring within the last 10 years: No If all of the above answers are "NO", then may proceed with Cephalosporin use.  . Lamictal [Lamotrigine] Other (See Comments)    Reaction:  Hallucinations  . Phenergan [Promethazine Hcl] Nausea And Vomiting  . Pravastatin Other (See Comments)    Reaction:  Muscle pain   . Sulfa Antibiotics Other (See Comments)    Reaction:  Unknown     VITALS:  Blood pressure 159/54, pulse 99, temperature 98.6 F (37 C), temperature source Oral, resp. rate 16, height 5\' 9"  (1.753 m), weight 122.6 kg (270 lb 4.5 oz), last menstrual period 03/16/2015, SpO2 96 %.  PHYSICAL EXAMINATION:  GENERAL:  50 y.o.-year-old patient sitting in the bed with no acute distress. Morbid obese.  HEENT:  Oropharynx and nasopharynx clear.  NECK:  Supple, no jugular venous distention.  LUNGS: CTA, no wheezing, No use of accessory muscles of respiration. No dullness to percussion  CARDIOVASCULAR: S1, S2 normal. No murmurs, rubs, or gallops.  ABDOMEN: Soft, nontender,  nondistended. Bowel sounds present. No organomegaly or mass. Peritoneal dailysis cath in right abd. Right femeral dialysis cath in place.  EXTREMITIES: Mild LE edema Right BKA. NEUROLOGIC: Cranial nerves II through XII are intact. Muscle strength 5/5 in all extremities. Sensation intact. PSYCHIATRIC: The patient is alert and oriented x 3.  SKIN: No obvious rash, lesion, or ulcer.  Permcath in place on chest   LABORATORY PANEL:   CBC  Recent Labs Lab 08/30/15 0335  WBC 7.6  HGB 8.4*  HCT 25.2*  PLT 336   ------------------------------------------------------------------------------------------------------------------  Chemistries   Recent Labs Lab 08/23/15 2319  08/30/15 0335  NA 138  < > 136  K 4.3  < > 3.8  CL 96*  < > 99*  CO2 26  < > 30  GLUCOSE 150*  < > 179*  BUN 75*  < > 48*  CREATININE 10.03*  < > 5.55*  CALCIUM 8.3*  < > 9.1  AST 13*  --   --   ALT 7*  --   --   ALKPHOS 105  --   --   BILITOT 0.6  --   --   < > = values in this interval not displayed. ------------------------------------------------------------------------------------------------------------------  Cardiac Enzymes No results for input(s): TROPONINI in the last 168 hours. ------------------------------------------------------------------------------------------------------------------  RADIOLOGY:  No results found.  EKG:   Orders placed or performed during the hospital encounter of 08/23/15  . ED EKG  . ED EKG  . EKG 12-Lead  . EKG 12-Lead    ASSESSMENT AND PLAN:    1. Acute encephalopathy,  possible due to percocet and inadequate dialysis. Resolved.  2. Sepsis, rule out peritonitis.  Cx negative Has been on IV bax. Stop today  3. End-stage renal disease on peritoneal dialysis.  Converting to HD.  Discussed with Dr. Wynelle Link. Vascular surgery consulted for home Placement.  4. Hypertension.  On Imdur, Torsemide and Lisinopril  5.  Anemia of chronic disease. No active  bleeding. Transfused 2 units . HgB up.   6. DM. On sliding scale.  PT: SNF when bed avilable  All the records are reviewed and case discussed with Care Management/Social Workerr. Management plans discussed with the patient, family and they are in agreement.  CODE STATUS: full code.  TOTAL TIME TAKING CARE OF THIS PATIENT:25 minutes.    Milagros Loll R M.D on 08/30/2015 at 3:35 PM  Between 7am to 6pm - Pager - [phone]  After 6pm go to [email or web address] - password [email or web address] [facility]  [place] [facility]  Office  [phone]  CC: Primary care physician; Jarome Matin, MD

## 2015-08-30 NOTE — Progress Notes (Signed)
Post hd tx 

## 2015-08-30 NOTE — H&P (Signed)
  [facility] History & Physical Update  The patient was interviewed and re-examined.  The patient's previous History and Physical has been reviewed and is unchanged.  There is no change in the plan of care. We plan to proceed with the scheduled procedure.  Shanaya Schneck, MD  08/30/2015, 1:11 PM

## 2015-08-30 NOTE — Progress Notes (Signed)
Patient stated that she hasn't had a bowel movement since she was admitted to hospital.  She normally takes miralax on a regular basis at home.  Patient has colace ordered as prn at bedtime.  She would like to have miralax after she has her permcath placed.  Arturo Morton  08/30/2015  6:34 AM

## 2015-08-30 NOTE — Op Note (Signed)
OPERATIVE NOTE    PRE-OPERATIVE DIAGNOSIS: 1. ESRD 2. Failure of peritoneal dialysis  POST-OPERATIVE DIAGNOSIS: same as above  PROCEDURE: 1. Ultrasound guidance for vascular access to the right internal jugular vein 2. Fluoroscopic guidance for placement of catheter 3. Placement of a 19 cm tip to cuff tunneled hemodialysis catheter via the right internal jugular vein  SURGEON: Festus Barren, MD  ANESTHESIA:  Local with Moderate conscious sedation for approximately 20 minutes using 2 mg of Versed and 50 mcg of Fentanyl  ESTIMATED BLOOD LOSS: 25 cc  FLUORO TIME: 0.1 minutes  CONTRAST: 0 cc  FINDING(S): 1.  Patent right internal jugular vein  SPECIMEN(S):  None  INDICATIONS:   [name] is a 50 y.o. female who presents with end-stage renal disease and failure of peritoneal dialysis.  The patient needs long term dialysis access for their ESRD, and a Permcath is necessary.  Risks and benefits are discussed and informed consent is obtained.    DESCRIPTION: After obtaining full informed written consent, the patient was brought back to the vascular suited. The patient's right neck and chest were sterilely prepped and draped in a sterile surgical field was created. Moderate conscious sedation was administered during a face to face encounter with the patient throughout the procedure with my supervision of the RN administering medicines and monitoring the patient's vital signs, pulse oximetry, telemetry and mental status throughout from the start of the procedure until the patient was taken to the recovery room.  The right internal jugular vein was visualized with ultrasound and found to be patent. It was then accessed under direct ultrasound guidance and a permanent image was recorded. A wire was placed. After skin nick and dilatation, the peel-away sheath was placed over the wire. I then turned my attention to an area under the clavicle. Approximately 1-2 fingerbreadths below the clavicle  a small counterincision was created and tunneled from the subclavicular incision to the access site. Using fluoroscopic guidance, a 19 centimeter tip to cuff tunneled hemodialysis catheter was selected, and tunneled from the subclavicular incision to the access site. It was then placed through the peel-away sheath and the peel-away sheath was removed. Using fluoroscopic guidance the catheter tips were parked in the right atrium. The appropriate distal connectors were placed. It withdrew blood well and flushed easily with heparinized saline and a concentrated heparin solution was then placed. It was secured to the chest wall with 2 Prolene sutures. The access incision was closed single 4-0 Monocryl. A 4-0 Monocryl pursestring suture was placed around the exit site. Sterile dressings were placed. The patient tolerated the procedure well and was taken to the recovery room in stable condition.  COMPLICATIONS: None  CONDITION: Stable  DEW,JASON  08/30/2015, 3:51 PM

## 2015-08-30 NOTE — Progress Notes (Signed)
Hemodialysis start 

## 2015-08-30 NOTE — Progress Notes (Signed)
[facility]  ROUNDING NOTE   Subjective:   Seen and examined on hemodialysis. Tolerating treatment well. UF goal 2.5    HEMODIALYSIS FLOWSHEET:  Blood Flow Rate (mL/min): 400 mL/min Arterial Pressure (mmHg): -200 mmHg Venous Pressure (mmHg): 200 mmHg Transmembrane Pressure (mmHg): 50 mmHg Ultrafiltration Rate (mL/min): 1000 mL/min Dialysate Flow Rate (mL/min): 600 ml/min Conductivity: Machine : 14.1 Conductivity: Machine : 14.1 Dialysis Fluid Bolus: Normal Saline Bolus Amount (mL): 250 mL Dialysate Change:  (3K) Intra-Hemodialysis Comments: 2073. Resting   Objective:  Vital signs in last 24 hours:  Temp:  [98.1 F (36.7 C)-98.6 F (37 C)] 98.1 F (36.7 C) (05/31 0915) Pulse Rate:  [84-96] 84 (05/31 1142) Resp:  [11-20] 11 (05/31 1142) BP: (126-170)/(62-77) 126/73 mmHg (05/31 1130) SpO2:  [94 %-100 %] 98 % (05/31 1142) Weight:  [125.193 kg (276 lb)] 125.193 kg (276 lb) (05/31 0915)  Weight change: -2.307 kg (-5 lb 1.4 oz) Filed Weights   08/29/15 0500 08/30/15 0500 08/30/15 0915  Weight: 124.739 kg (275 lb) 125.193 kg (276 lb) 125.193 kg (276 lb)    Intake/Output: I/O last 3 completed shifts: In: 600 [P.O.:600] Out: 1375 [Urine:1375]   Intake/Output this shift:     Physical Exam: General: NAD, sitting in chair  Head: Normocephalic, atraumatic. Moist oral mucosal membranes  Eyes: Anicteric  Neck: Supple, trachea midline  Lungs:  Clear to auscultation  Heart: Regular rate and rhythm no rubs  Abdomen:  Soft, nontender, BS present  Extremities: trace LLE edema, R BKA  Neurologic: Awake, Alert, moving all four extremities  Skin: No lesions  Access: PD catheter in place, Right femoral HD catheter 5/26    Basic Metabolic Panel:  Recent Labs Lab 08/23/15 2319 08/24/15 0452 08/26/15 0617 08/30/15 0335  NA 138 138 134* 136  K 4.3 3.9 4.0 3.8  CL 96* 98* 95* 99*  CO2 GLUCOSE 150* 183* 166* 179*  BUN 75* 74* 75* 48*   CREATININE 10.03* 9.81* 9.82* 5.55*  CALCIUM 8.3* 7.8* 8.0* 9.1    Liver Function Tests:  Recent Labs Lab 08/23/15 2319  AST 13*  ALT 7*  ALKPHOS 105  BILITOT 0.6  PROT 5.4*  ALBUMIN 1.5*    Recent Labs Lab 08/23/15 2319  LIPASE 14    Recent Labs Lab 08/23/15 2319  AMMONIA 29    CBC:  Recent Labs Lab 08/24/15 0452 08/25/15 0330 08/26/15 0617 08/27/15 0811 08/28/15 0608 08/30/15 0335  WBC 12.9* 13.7* 9.4  --  8.3 7.6  NEUTROABS  --   --   --   --   --  4.4  HGB 6.1* 7.7* 6.6* 6.6* 9.7* 8.4*  HCT 19.4* 23.4* 20.1*  --  29.8* 25.2*  MCV 88.1 87.6 89.0  --  87.5 85.1  PLT 439 461* 408  --  392 336    Cardiac Enzymes: No results for input(s): CKTOTAL, CKMB, CKMBINDEX, TROPONINI in the last 168 hours.  BNP: Invalid input(s): POCBNP  CBG:  Recent Labs Lab 08/29/15 0754 08/29/15 1135 08/29/15 1658 08/29/15 2137 08/30/15 0754  GLUCAP 158* 207* 215* 247* 144*    Microbiology: Results for orders placed or performed during the hospital encounter of 08/23/15  Culture, blood (routine x 2)     Status: None   Collection Time: 08/24/15 12:54 AM  Result Value Ref Range Status   Specimen Description BLOOD RIGHT ARM  Final   Special Requests BOTTLES DRAWN AEROBIC AND ANAEROBIC  Final   Culture NO  GROWTH 5 DAYS  Final   Report Status 08/29/2015 FINAL  Final  Culture, blood (routine x 2)     Status: None   Collection Time: 08/24/15 12:54 AM  Result Value Ref Range Status   Specimen Description BLOOD RIGHT ANTECUBITAL  Final   Special Requests BOTTLES DRAWN AEROBIC AND ANAEROBIC  Final   Culture NO GROWTH 5 DAYS  Final   Report Status 08/29/2015 FINAL  Final  Culture, body fluid-bottle     Status: None   Collection Time: 08/25/15  4:10 PM  Result Value Ref Range Status   Specimen Description PERITONEAL FLUID  Final   Special Requests NONE  Final   Culture NO GROWTH 4 DAYS  Final   Report Status 08/29/2015 FINAL  Final    Coagulation  Studies: No results for input(s): LABPROT, INR in the last 72 hours.  Urinalysis: No results for input(s): COLORURINE, LABSPEC, PHURINE, GLUCOSEU, HGBUR, BILIRUBINUR, KETONESUR, PROTEINUR, UROBILINOGEN, NITRITE, LEUKOCYTESUR in the last 72 hours.  Invalid input(s): APPERANCEUR    Imaging: No results found.   Medications:   . sodium chloride 20 mL/hr at 08/27/15 1114   . aspirin EC  81 mg Oral Daily  . calcitRIOL  0.25 mcg Oral Q breakfast  . calcium acetate  2,001 mg Oral TID WC  . clopidogrel  75 mg Oral Daily  . dialysis solution 2.5% low-MG/low-CA   Intraperitoneal Q24H  . erythromycin  250 mg Oral TID WC & HS  . escitalopram  10 mg Oral Daily  . ezetimibe  10 mg Oral QHS  . gentamicin cream  1 application Topical Daily  . heparin  5,000 Units Subcutaneous Q8H  . insulin aspart  0-5 Units Subcutaneous QHS  . insulin aspart  0-9 Units Subcutaneous TID WC  . insulin detemir  8 Units Subcutaneous QHS  . isosorbide mononitrate  60 mg Oral Daily  . levofloxacin (LEVAQUIN) IV  500 mg Intravenous Q48H  . nicotine  14 mg Transdermal Daily  . nystatin  1 Bottle Topical BID  . pantoprazole  40 mg Oral QAC breakfast  . polyethylene glycol  17 g Oral Daily  . ranolazine  500 mg Oral BID  . risperiDONE  1 mg Oral QHS  . sodium chloride flush  3 mL Intravenous Q12H  . torsemide  100 mg Oral Daily   acetaminophen **OR** acetaminophen, albuterol, docusate sodium, lactulose, nitroGLYCERIN, ondansetron **OR** ondansetron (ZOFRAN) IV, oxyCODONE, senna, vancomycin, zinc oxide  Assessment/ Plan:  50 y.o.white female with past medical history of end-stage renal disease on peritoneal dialysis, asthma, gastroparesis, diabetes mellitus, congestive heart failure, hypertension, endometriosis, coronary atherosclerosis/medical management, PD peritonitis with micrococcus treated with meropenem and IV/IP vancomycin  [facility]/peritoneal dialysis  1. End-stage renal disease on  hemodialysis dialysis.  - Now transitioning temporarily back to HD. Continue MWF schedule. Will hold PD for now.   2.  Anemia of CKD:   - epo with HD.   3. Hypertension:  - not currently on any agents.   4.  Secondary Hyperparathyroidism:  - calcitriol and calcium acetate.    LOS: 6 Josian Lanese 5/31/201712:03 PM

## 2015-08-31 ENCOUNTER — Encounter: Payer: Self-pay | Admitting: Vascular Surgery

## 2015-08-31 LAB — GLUCOSE, CAPILLARY
Glucose-Capillary: 138 mg/dL — ABNORMAL HIGH (ref 65–99)
Glucose-Capillary: 150 mg/dL — ABNORMAL HIGH (ref 65–99)

## 2015-08-31 LAB — PHOSPHORUS: PHOSPHORUS: 4 mg/dL (ref 2.5–4.6)

## 2015-08-31 MED ORDER — PENTAFLUOROPROP-TETRAFLUOROETH EX AERO
1.0000 "application " | INHALATION_SPRAY | CUTANEOUS | Status: DC | PRN
  Filled 2015-08-31: qty 30

## 2015-08-31 MED ORDER — SODIUM CHLORIDE 0.9 % IV SOLN: 100.0000 mL | INTRAVENOUS | Status: DC | PRN

## 2015-08-31 MED ORDER — LIDOCAINE HCL (PF) 1 % IJ SOLN
5.0000 mL | INTRAMUSCULAR | Status: DC | PRN
  Filled 2015-08-31: qty 5

## 2015-08-31 MED ORDER — ALTEPLASE 2 MG IJ SOLR
2.0000 mg | Freq: Once | INTRAMUSCULAR | Status: DC | PRN
  Filled 2015-08-31: qty 2

## 2015-08-31 MED ORDER — HEPARIN SODIUM (PORCINE) 1000 UNIT/ML DIALYSIS
1000.0000 [IU] | INTRAMUSCULAR | Status: DC | PRN
  Filled 2015-08-31: qty 1

## 2015-08-31 MED ORDER — LIDOCAINE-PRILOCAINE 2.5-2.5 % EX CREA
1.0000 "application " | TOPICAL_CREAM | CUTANEOUS | Status: DC | PRN
  Filled 2015-08-31: qty 5

## 2015-08-31 NOTE — Progress Notes (Signed)
[facility]  ROUNDING NOTE   Subjective:   Seen and examined on hemodialysis. Tolerating treatment well. UF goal 2.  permcath placed RIJ. trialysis catheter removed.     HEMODIALYSIS FLOWSHEET:  Blood Flow Rate (mL/min): 400 mL/min Arterial Pressure (mmHg): -190 mmHg Venous Pressure (mmHg): 140 mmHg Transmembrane Pressure (mmHg): 50 mmHg Ultrafiltration Rate (mL/min): 670 mL/min Dialysate Flow Rate (mL/min): 600 ml/min Conductivity: Machine : 13.9 Conductivity: Machine : 13.9 Dialysis Fluid Bolus: Normal Saline Bolus Amount (mL): 250 mL Dialysate Change:  (3K) Intra-Hemodialysis Comments: (Patient doing well no complaint)   Objective:  Vital signs in last 24 hours:  Temp:  [97.9 F (36.6 C)-98.7 F (37.1 C)] 98.3 F (36.8 C) (06/01 1025) Pulse Rate:  [84-105] 87 (06/01 1130) Resp:  [12-19] 15 (06/01 1130) BP: (143-178)/(52-80) 178/80 mmHg (06/01 1130) SpO2:  [90 %-98 %] 95 % (06/01 1130) Weight:  [121 kg (266 lb 12.1 oz)-122.6 kg (270 lb 4.5 oz)] 121 kg (266 lb 12.1 oz) (06/01 1100)  Weight change: 0 kg (0 oz) Filed Weights   08/30/15 1223 08/31/15 0500 08/31/15 1100  Weight: 122.6 kg (270 lb 4.5 oz) 121.972 kg (268 lb 14.4 oz) 121 kg (266 lb 12.1 oz)    Intake/Output: I/O last 3 completed shifts: In: 0  Out: 3250 [Urine:750; Other:2500]   Intake/Output this shift:  Total I/O In: 240 [P.O.:240] Out: -   Physical Exam: General: NAD, sitting in chair  Head: Normocephalic, atraumatic. Moist oral mucosal membranes  Eyes: Anicteric  Neck: Supple, trachea midline  Lungs:  Clear to auscultation  Heart: Regular rate and rhythm no rubs  Abdomen:  Soft, nontender, BS present  Extremities: trace LLE edema, R BKA  Neurologic: Awake, Alert, moving all four extremities  Skin: No lesions  Access: PD catheter in place, Right femoral HD catheter 5/26    Basic Metabolic Panel:  Recent Labs Lab 08/26/15 0617 08/30/15 0335  NA 134* 136  K 4.0  3.8  CL 95* 99*  CO2 24 30  GLUCOSE 166* 179*  BUN 75* 48*  CREATININE 9.82* 5.55*  CALCIUM 8.0* 9.1    Liver Function Tests: No results for input(s): AST, ALT, ALKPHOS, BILITOT, PROT, ALBUMIN in the last 168 hours. No results for input(s): LIPASE, AMYLASE in the last 168 hours. No results for input(s): AMMONIA in the last 168 hours.  CBC:  Recent Labs Lab 08/25/15 0330 08/26/15 0617 08/27/15 0811 08/28/15 0608 08/30/15 0335  WBC 13.7* 9.4  --  8.3 7.6  NEUTROABS  --   --   --   --  4.4  HGB 7.7* 6.6* 6.6* 9.7* 8.4*  HCT 23.4* 20.1*  --  29.8* 25.2*  MCV 87.6 89.0  --  87.5 85.1  PLT 461* 408  --  392 336    Cardiac Enzymes: No results for input(s): CKTOTAL, CKMB, CKMBINDEX, TROPONINI in the last 168 hours.  BNP: Invalid input(s): POCBNP  CBG:  Recent Labs Lab 08/29/15 2137 08/30/15 0754 08/30/15 1649 08/30/15 2110 08/31/15 0723  GLUCAP 247* 144* 132* 177* 138*    Microbiology: Results for orders placed or performed during the hospital encounter of 08/23/15  Culture, blood (routine x 2)     Status: None   Collection Time: 08/24/15 12:54 AM  Result Value Ref Range Status   Specimen Description BLOOD RIGHT ARM  Final   Special Requests BOTTLES DRAWN AEROBIC AND ANAEROBIC  Final   Culture NO GROWTH 5 DAYS  Final   Report Status 08/29/2015  FINAL  Final  Culture, blood (routine x 2)     Status: None   Collection Time: 08/24/15 12:54 AM  Result Value Ref Range Status   Specimen Description BLOOD RIGHT ANTECUBITAL  Final   Special Requests BOTTLES DRAWN AEROBIC AND ANAEROBIC  Final   Culture NO GROWTH 5 DAYS  Final   Report Status 08/29/2015 FINAL  Final  Culture, body fluid-bottle     Status: None   Collection Time: 08/25/15  4:10 PM  Result Value Ref Range Status   Specimen Description PERITONEAL FLUID  Final   Special Requests NONE  Final   Culture NO GROWTH 4 DAYS  Final   Report Status 08/29/2015 FINAL  Final    Coagulation  Studies: No results for input(s): LABPROT, INR in the last 72 hours.  Urinalysis: No results for input(s): COLORURINE, LABSPEC, PHURINE, GLUCOSEU, HGBUR, BILIRUBINUR, KETONESUR, PROTEINUR, UROBILINOGEN, NITRITE, LEUKOCYTESUR in the last 72 hours.  Invalid input(s): APPERANCEUR    Imaging: No results found.   Medications:     . aspirin EC  81 mg Oral Daily  . calcitRIOL  0.25 mcg Oral Q breakfast  . calcium acetate  2,001 mg Oral TID WC  . clopidogrel  75 mg Oral Daily  . erythromycin  250 mg Oral TID WC & HS  . escitalopram  10 mg Oral Daily  . ezetimibe  10 mg Oral QHS  . gentamicin cream  1 application Topical Daily  . heparin  5,000 Units Subcutaneous Q8H  . insulin aspart  0-5 Units Subcutaneous QHS  . insulin aspart  0-9 Units Subcutaneous TID WC  . insulin detemir  8 Units Subcutaneous QHS  . isosorbide mononitrate  60 mg Oral Daily  . lisinopril  40 mg Oral Daily  . nicotine  14 mg Transdermal Daily  . nystatin  1 Bottle Topical BID  . pantoprazole  40 mg Oral QAC breakfast  . polyethylene glycol  17 g Oral Daily  . ranolazine  500 mg Oral BID  . risperiDONE  1 mg Oral QHS  . sodium chloride flush  3 mL Intravenous Q12H  . torsemide  100 mg Oral Daily   sodium chloride, sodium chloride, acetaminophen **OR** acetaminophen, albuterol, alteplase, bisacodyl, docusate sodium, heparin, lactulose, lidocaine (PF), lidocaine-prilocaine, nitroGLYCERIN, ondansetron **OR** ondansetron (ZOFRAN) IV, oxyCODONE, pentafluoroprop-tetrafluoroeth, senna, zinc oxide  Assessment/ Plan:  50 y.o.white female with past medical history of end-stage renal disease on peritoneal dialysis, asthma, gastroparesis, diabetes mellitus, congestive heart failure, hypertension, endometriosis, coronary atherosclerosis/medical management, PD peritonitis with micrococcus treated with meropenem and IV/IP vancomycin  [facility]/peritoneal dialysis  1. End-stage renal disease on  hemodialysis dialysis. Tolerating hemodialysis treatment.  - Transitioned to hemodialysis for rehab, will resume PD when home.  - Extra treatment today for edema. UF goal of 2 litres.   2.  Anemia of CKD:   - epo with HD.   3. Hypertension:  - not currently on any agents.   4.  Secondary Hyperparathyroidism:  - calcitriol and calcium acetate.    LOS: 7 Melvie Paglia 6/1/201712:01 PM

## 2015-08-31 NOTE — Progress Notes (Deleted)
MD paged regarding elevated B.S. Orders given for 10 units.

## 2015-08-31 NOTE — Care Management Important Message (Signed)
Important Message  Patient Details  Name: Kimberly Montgomery MRN: [id number] Date of Birth: 06/14/1965   Medicare Important Message Given:  Yes  Patient received this notice approx 8:45 this morning.  CM forgot to document it at the time it was given    Eber Hong, RN 08/31/2015, 4:44 PM

## 2015-08-31 NOTE — Progress Notes (Signed)
Report called to [facility]. IV was taken out. Pt was transported to facility via daughter. Packet was in hand.

## 2015-08-31 NOTE — Progress Notes (Signed)
PT Cancellation Note  Patient Details Name: Kimberly Montgomery MRN: [id number] DOB: October 14, 1965   Cancelled Treatment:    Reason Eval/Treat Not Completed: Patient declined, no reason specified   Pt declined session.  Stated she had been sitting edge of bed eating breakfast and just returned to supine.  Offered transfers/exercise but she declined stating she was waiting transfer to Rehab today.   Danielle Dess 08/31/2015, 9:05 AM

## 2015-08-31 NOTE — Clinical Social Work Note (Signed)
Patient to discharge to [facility] today. [facility] at [facility] has stated that they received auth for patient to come. CSW has had [facility] work out an arrangement to get patient to dialysis without having to charge patient. York Spaniel MSW,LCSW [phone]

## 2015-09-01 ENCOUNTER — Encounter: Payer: Self-pay | Admitting: Vascular Surgery

## 2015-09-01 LAB — PARATHYROID HORMONE, INTACT (NO CA): PTH: 42 pg/mL (ref 15–65)

## 2015-09-12 ENCOUNTER — Observation Stay
Admission: EM | Admit: 2015-09-12 | Discharge: 2015-09-15 | Disposition: A | Discharge status: Skilled Nursing Facility | Payer: Medicare HMO | Attending: Internal Medicine | Admitting: Internal Medicine

## 2015-09-12 ENCOUNTER — Emergency Department: Payer: Medicare HMO

## 2015-09-12 DIAGNOSIS — I249 Acute ischemic heart disease, unspecified: Secondary | ICD-10-CM | POA: Diagnosis present

## 2015-09-12 DIAGNOSIS — I509 Heart failure, unspecified: Secondary | ICD-10-CM | POA: Insufficient documentation

## 2015-09-12 DIAGNOSIS — Z7982 Long term (current) use of aspirin: Secondary | ICD-10-CM | POA: Diagnosis not present

## 2015-09-12 DIAGNOSIS — E1143 Type 2 diabetes mellitus with diabetic autonomic (poly)neuropathy: Secondary | ICD-10-CM | POA: Insufficient documentation

## 2015-09-12 DIAGNOSIS — N2581 Secondary hyperparathyroidism of renal origin: Secondary | ICD-10-CM | POA: Insufficient documentation

## 2015-09-12 DIAGNOSIS — Z882 Allergy status to sulfonamides status: Secondary | ICD-10-CM | POA: Insufficient documentation

## 2015-09-12 DIAGNOSIS — I132 Hypertensive heart and chronic kidney disease with heart failure and with stage 5 chronic kidney disease, or end stage renal disease: Secondary | ICD-10-CM | POA: Insufficient documentation

## 2015-09-12 DIAGNOSIS — I371 Nonrheumatic pulmonary valve insufficiency: Secondary | ICD-10-CM | POA: Insufficient documentation

## 2015-09-12 DIAGNOSIS — Z79899 Other long term (current) drug therapy: Secondary | ICD-10-CM | POA: Insufficient documentation

## 2015-09-12 DIAGNOSIS — K219 Gastro-esophageal reflux disease without esophagitis: Secondary | ICD-10-CM | POA: Diagnosis not present

## 2015-09-12 DIAGNOSIS — K3184 Gastroparesis: Secondary | ICD-10-CM | POA: Insufficient documentation

## 2015-09-12 DIAGNOSIS — I493 Ventricular premature depolarization: Secondary | ICD-10-CM | POA: Diagnosis not present

## 2015-09-12 DIAGNOSIS — D631 Anemia in chronic kidney disease: Secondary | ICD-10-CM | POA: Diagnosis not present

## 2015-09-12 DIAGNOSIS — F319 Bipolar disorder, unspecified: Secondary | ICD-10-CM | POA: Diagnosis not present

## 2015-09-12 DIAGNOSIS — N186 End stage renal disease: Secondary | ICD-10-CM | POA: Diagnosis not present

## 2015-09-12 DIAGNOSIS — E1122 Type 2 diabetes mellitus with diabetic chronic kidney disease: Principal | ICD-10-CM | POA: Insufficient documentation

## 2015-09-12 DIAGNOSIS — K567 Ileus, unspecified: Secondary | ICD-10-CM | POA: Diagnosis not present

## 2015-09-12 DIAGNOSIS — F17210 Nicotine dependence, cigarettes, uncomplicated: Secondary | ICD-10-CM | POA: Insufficient documentation

## 2015-09-12 DIAGNOSIS — Z7902 Long term (current) use of antithrombotics/antiplatelets: Secondary | ICD-10-CM | POA: Diagnosis not present

## 2015-09-12 DIAGNOSIS — E785 Hyperlipidemia, unspecified: Secondary | ICD-10-CM | POA: Insufficient documentation

## 2015-09-12 DIAGNOSIS — I25110 Atherosclerotic heart disease of native coronary artery with unstable angina pectoris: Secondary | ICD-10-CM | POA: Insufficient documentation

## 2015-09-12 DIAGNOSIS — Z992 Dependence on renal dialysis: Secondary | ICD-10-CM | POA: Insufficient documentation

## 2015-09-12 DIAGNOSIS — G40909 Epilepsy, unspecified, not intractable, without status epilepticus: Secondary | ICD-10-CM | POA: Insufficient documentation

## 2015-09-12 DIAGNOSIS — Z880 Allergy status to penicillin: Secondary | ICD-10-CM | POA: Diagnosis not present

## 2015-09-12 DIAGNOSIS — Z794 Long term (current) use of insulin: Secondary | ICD-10-CM | POA: Insufficient documentation

## 2015-09-12 DIAGNOSIS — J45909 Unspecified asthma, uncomplicated: Secondary | ICD-10-CM | POA: Insufficient documentation

## 2015-09-12 DIAGNOSIS — Z8249 Family history of ischemic heart disease and other diseases of the circulatory system: Secondary | ICD-10-CM | POA: Insufficient documentation

## 2015-09-12 DIAGNOSIS — R079 Chest pain, unspecified: Secondary | ICD-10-CM | POA: Diagnosis present

## 2015-09-12 DIAGNOSIS — I081 Rheumatic disorders of both mitral and tricuspid valves: Secondary | ICD-10-CM | POA: Insufficient documentation

## 2015-09-12 DIAGNOSIS — Z888 Allergy status to other drugs, medicaments and biological substances status: Secondary | ICD-10-CM | POA: Diagnosis not present

## 2015-09-12 LAB — BASIC METABOLIC PANEL
ANION GAP: 9 (ref 5–15)
BUN: 26 mg/dL — ABNORMAL HIGH (ref 6–20)
CALCIUM: 9.3 mg/dL (ref 8.9–10.3)
CHLORIDE: 98 mmol/L — AB (ref 101–111)
CO2: 31 mmol/L (ref 22–32)
Creatinine, Ser: 3.83 mg/dL — ABNORMAL HIGH (ref 0.44–1.00)
GFR calc non Af Amer: 13 mL/min — ABNORMAL LOW (ref >60)
GFR, EST AFRICAN AMERICAN: 15 mL/min — AB (ref >60)
Glucose, Bld: 210 mg/dL — ABNORMAL HIGH (ref 65–99)
Potassium: 3.4 mmol/L — ABNORMAL LOW (ref 3.5–5.1)
Sodium: 138 mmol/L (ref 135–145)

## 2015-09-12 LAB — CBC
HCT: 30.5 % — ABNORMAL LOW (ref 35.0–47.0)
HEMOGLOBIN: 10.1 g/dL — AB (ref 12.0–16.0)
MCH: 28.1 pg (ref 26.0–34.0)
MCHC: 33.1 g/dL (ref 32.0–36.0)
MCV: 84.8 fL (ref 80.0–100.0)
Platelets: 328 10*3/uL (ref 150–440)
RBC: 3.59 MIL/uL — AB (ref 3.80–5.20)
RDW: 14 % (ref 11.5–14.5)
WBC: 6.9 10*3/uL (ref 3.6–11.0)

## 2015-09-12 LAB — TROPONIN I: Troponin I: 0.06 ng/mL — ABNORMAL HIGH (ref <0.031)

## 2015-09-12 MED ORDER — ONDANSETRON HCL 4 MG/2ML IJ SOLN
4.0000 mg | Freq: Once | INTRAMUSCULAR | Status: AC
  Administered 2015-09-12: 4 mg via INTRAVENOUS
  Filled 2015-09-12: qty 2

## 2015-09-12 MED ORDER — NITROGLYCERIN 2 % TD OINT
1.0000 [in_us] | TOPICAL_OINTMENT | Freq: Once | TRANSDERMAL | Status: AC
  Administered 2015-09-12: 1 [in_us] via TOPICAL
  Filled 2015-09-12: qty 1

## 2015-09-12 MED ORDER — HYDROMORPHONE HCL 1 MG/ML IJ SOLN
1.0000 mg | Freq: Once | INTRAMUSCULAR | Status: AC
  Administered 2015-09-12: 1 mg via INTRAVENOUS
  Filled 2015-09-12: qty 1

## 2015-09-12 NOTE — ED Notes (Signed)
Pt presents with chest pain that first began this morning at 11am, [facility] gave 2 nitro. Pt had chest pain again around 8pm, facility gave nitro without relief and aspirin. [facility] gave 3 nitro without relief. Pt was sinus tach with EMS around 110, BP 190/130, 94% on RA. Pt states pain is central chest with pain going down L arm to elbow and wrist. Pt with hx of HTN and diabetes, former smoker.

## 2015-09-12 NOTE — ED Notes (Signed)
Pt transported to xray via stretcher

## 2015-09-12 NOTE — ED Notes (Signed)
Pillow provided and wrap on R leg pulled up.

## 2015-09-12 NOTE — ED Notes (Signed)
Pt provided warm blankets for comfort

## 2015-09-12 NOTE — ED Notes (Signed)
Pt urinated in pad. Pt cleaned, changed and repositioned with help of Gerilyn Pilgrim, EDT.

## 2015-09-12 NOTE — ED Notes (Signed)
Pt returned from xray via stretcher.

## 2015-09-12 NOTE — ED Provider Notes (Signed)
Time Seen: Approximately *20/200  I have reviewed the triage notes  Chief Complaint: Chest Pain   History of Present Illness: Kimberly Montgomery is a 50 y.o. female who presents with intermittent chest discomfort that started last week on Tuesday and she states she took nitroglycerin and seemed to resolve on its own. States the pain returned today earlier and she took 2 nitroglycerin and again resolved. She does have a history of self described unstable angina. The patient states his pain is very similar to her previous discomfort except it is more centralized than on the left side. She states she did get slightly nauseated and short of breath she denies any diaphoresis. She states this episode started at 8 PM tonight is relatively consistent she states she tried nitroglycerin prior to arrival without significant relief and was transported here to emergency department via EMS. She does have a history of renal failure and congestive heart failure. She receives dialysis Tuesday, Thursday, and Saturday. She states that she received a half of her dialysis treatment today. States she is now below her dry weight  Past Medical History  Diagnosis Date  . Renal disorder   . Hypertension   . CHF (congestive heart failure) (HCC)   . Diabetes mellitus without complication (HCC)   . Gastroparesis   . Asthma   . ESRD (end stage renal disease) (HCC)   . Endometriosis   . CAD (coronary artery disease)   . HLD (hyperlipidemia)   . GERD (gastroesophageal reflux disease)     Patient Active Problem List   Diagnosis Date Noted  . Bipolar I disorder, most recent episode depressed (HCC)   . Altered mental status 08/24/2015  . ESRD (end stage renal disease) (HCC)   . Ileus (HCC)   . Bipolar I disorder (HCC) 07/25/2015  . Seizures (HCC) 07/25/2015  . Peritonitis (HCC) 07/16/2015  . Unstable angina (HCC) 07/09/2015  . ESRD on peritoneal dialysis (HCC) 07/09/2015  . Accelerated hypertension 07/09/2015  .  Type 2 diabetes mellitus (HCC) 07/09/2015  . CAD (coronary artery disease) 07/09/2015  . HLD (hyperlipidemia) 07/09/2015  . GERD (gastroesophageal reflux disease) 07/09/2015  . Chest pain 05/12/2015    Past Surgical History  Procedure Laterality Date  . Hysterotomy    . Below knee leg amputation    . Pd tube inserstion    . Hd fistula surgery with reversal    . Cardiac catheterization Right 07/10/2015    Procedure: Left Heart Cath and Coronary Angiography;  Surgeon: Laurier Nancy, MD;  Location: [facility];  Service: Cardiovascular;  Laterality: Right;  . Peripheral vascular catheterization N/A 08/30/2015    Procedure: Dialysis/Perma Catheter Insertion;  Surgeon: Annice Needy, MD;  Location: [facility];  Service: Cardiovascular;  Laterality: N/A;    Past Surgical History  Procedure Laterality Date  . Hysterotomy    . Below knee leg amputation    . Pd tube inserstion    . Hd fistula surgery with reversal    . Cardiac catheterization Right 07/10/2015    Procedure: Left Heart Cath and Coronary Angiography;  Surgeon: Laurier Nancy, MD;  Location: [facility];  Service: Cardiovascular;  Laterality: Right;  . Peripheral vascular catheterization N/A 08/30/2015    Procedure: Dialysis/Perma Catheter Insertion;  Surgeon: Annice Needy, MD;  Location: [facility];  Service: Cardiovascular;  Laterality: N/A;    Current Outpatient Rx  Name  Route  Sig  Dispense  Refill  .  acetaminophen (TYLENOL) 325 MG tablet   Oral   Take 650 mg by mouth every 6 (six) hours as needed for mild pain.          [name] aspirin EC 81 MG tablet   Oral   Take 81 mg by mouth daily.         . bisacodyl (DULCOLAX) 10 MG suppository   Rectal   Place 10 mg rectally daily as needed for moderate constipation. Use if no relief from Milk of Magnesia         . calcitRIOL (ROCALTROL) 0.25 MCG capsule   Oral   Take 0.25 mcg by mouth daily.         . calcium acetate (PHOSLO) 667 MG  capsule   Oral   Take 3 capsules (2,001 mg total) by mouth 3 (three) times daily with meals.   90 capsule   0   . calcium carbonate (TUMS - DOSED IN MG ELEMENTAL CALCIUM) 500 MG chewable tablet   Oral   Chew 1 tablet (200 mg of elemental calcium total) by mouth 3 (three) times daily with meals.   120 tablet   0   . clopidogrel (PLAVIX) 75 MG tablet   Oral   Take 75 mg by mouth daily.         . divalproex (DEPAKOTE) 500 MG DR tablet   Oral   Take 1,000 mg by mouth 3 (three) times daily.         [name] docusate sodium (COLACE) 100 MG capsule   Oral   Take 100 mg by mouth at bedtime.          [name] doxycycline (VIBRA-TABS) 100 MG tablet   Oral   Take 100 mg by mouth 2 (two) times daily.         [name] erythromycin (E-MYCIN) 250 MG tablet   Oral   Take 250 mg by mouth 4 (four) times daily.         . hydrocortisone (ANUSOL-HC) 25 MG suppository   Rectal   Place 25 mg rectally daily.         . insulin aspart (NOVOLOG) 100 UNIT/ML injection   Subcutaneous   Inject 0-9 Units into the skin 3 (three) times daily with meals. 15 units three times a day with meals, 5 units with snacks         . insulin glargine (LANTUS) 100 UNIT/ML injection   Subcutaneous   Inject 0.08 mLs (8 Units total) into the skin at bedtime.   10 mL   11   . isosorbide mononitrate (IMDUR) 60 MG 24 hr tablet   Oral   Take 1 tablet (60 mg total) by mouth daily.   30 tablet   00   . lactulose (CHRONULAC) 10 GM/15ML solution   Oral   Take 10 g by mouth daily as needed for mild constipation.         . levETIRAcetam (KEPPRA) 500 MG tablet   Oral   Take 1 tablet (500 mg total) by mouth every morning.   30 tablet   0   . lisinopril (PRINIVIL,ZESTRIL) 40 MG tablet   Oral   Take 40 mg by mouth daily.      3   . metoCLOPramide (REGLAN) 10 MG tablet   Oral   Take 1 tablet (10 mg total) by mouth 4 (four) times daily.   90 tablet   0   . metolazone (ZAROXOLYN) 5 MG tablet   Oral   Take  5 mg  by mouth daily as needed (fluid).         . nicotine (NICODERM CQ - DOSED IN MG/24 HOURS) 14 mg/24hr patch   Transdermal   Place 14 mg onto the skin daily.         . nitroGLYCERIN (NITROSTAT) 0.4 MG SL tablet   Sublingual   Place 0.4 mg under the tongue every 5 (five) minutes as needed for chest pain. Reported on 07/10/2015         . nystatin (MYCOSTATIN) powder   Topical   Apply 1 g topically 2 (two) times daily.         [name] omeprazole (PRILOSEC) 20 MG capsule   Oral   Take 40 mg by mouth 2 (two) times daily before a meal.         . ondansetron (ZOFRAN ODT) 4 MG disintegrating tablet   Oral   Take 1 tablet (4 mg total) by mouth every 6 (six) hours as needed for nausea or vomiting.   20 tablet   0   . oxyCODONE (OXY IR/ROXICODONE) 5 MG immediate release tablet   Oral   Take 1 tablet (5 mg total) by mouth every 6 (six) hours as needed for severe pain.   30 tablet   0   . polyethylene glycol (MIRALAX / GLYCOLAX) packet   Oral   Take 17 g by mouth daily. Patient taking differently: Take 17 g by mouth 2 (two) times daily.    14 each   0   . ranolazine (RANEXA) 500 MG 12 hr tablet   Oral   Take 1 tablet (500 mg total) by mouth 2 (two) times daily.   60 tablet   0   . risperiDONE (RISPERDAL) 1 MG tablet   Oral   Take 1 mg by mouth 2 (two) times daily.          [name] senna (SENOKOT) 8.6 MG TABS tablet   Oral   Take 2 tablets by mouth at bedtime as needed for mild constipation.         . senna (SENOKOT) 8.6 MG TABS tablet   Oral   Take 2 tablets by mouth 2 (two) times daily.         [name] torsemide (DEMADEX) 100 MG tablet   Oral   Take 100 mg by mouth daily.           Allergies:  Cephalosporins; Penicillins; Lamictal; Phenergan; Pravastatin; and Sulfa antibiotics  Family History: Family History  Problem Relation Age of Onset  . CAD    . Diabetes    . Bipolar disorder    . Cervical cancer Mother     Social History: Social History  Substance Use  Topics  . Smoking status: Current Every Day Smoker -- 0.50 packs/day    Types: Cigarettes  . Smokeless tobacco: None  . Alcohol Use: No     Review of Systems:   10 point review of systems was performed and was otherwise negative:  Constitutional: No fever Eyes: No visual disturbances ENT: No sore throat, ear pain Cardiac: Midline pressure pain without radiation to the arm or jaw area Respiratory: Mild shortness of breath, no wheezing, or stridor Abdomen: No abdominal pain, no vomiting, No diarrhea Endocrine: No weight loss, No night sweats Extremities: No peripheral edema, cyanosis Skin: No rashes, easy bruising Neurologic: No focal weakness, trouble with speech or swollowing Urologic: No dysuria, Hematuria, or urinary frequency   Physical Exam:  ED Triage Vitals  Enc Vitals Group     BP 09/12/15 2109 180/108 mmHg     Pulse Rate 09/12/15 2109 111     Resp 09/12/15 2109 14     Temp 09/12/15 2109 98.2 F (36.8 C)     Temp Source 09/12/15 2109 Oral     SpO2 09/12/15 2104 94 %     Weight 09/12/15 2109 265 lb (120.203 kg)     Height --      Head Cir --      Peak Flow --      Pain Score 09/12/15 2109 8     Pain Loc --      Pain Edu? --      Excl. in GC? --     General: Awake , Alert , and Oriented times 3; GCS 15 Head: Normal cephalic , atraumatic Eyes: Pupils equal , round, reactive to light Nose/Throat: No nasal drainage, patent upper airway without erythema or exudate.  Neck: Supple, Full range of motion, No anterior adenopathy or palpable thyroid masses Lungs: Clear to ascultation without wheezes , rhonchi, or rales Heart: Regular rate, regular rhythm without murmurs , gallops , or rubs Abdomen: Soft, non tender without rebound, guarding , or rigidity; bowel sounds positive and symmetric in all 4 quadrants. No organomegaly .        Extremities: Right-sided BKA mild circumferential edema in the left lower extremity Neurologic: normal ambulation, Motor symmetric  without deficits, sensory intact Skin: warm, dry, no rashes   Labs:   All laboratory work was reviewed including any pertinent negatives or positives listed below:  Labs Reviewed  BASIC METABOLIC PANEL - Abnormal; Notable for the following:    Potassium 3.4 (*)    Chloride 98 (*)    Glucose, Bld 210 (*)    BUN 26 (*)    Creatinine, Ser 3.83 (*)    GFR calc non Af Amer 13 (*)    GFR calc Af Amer 15 (*)    All other components within normal limits  CBC - Abnormal; Notable for the following:    RBC 3.59 (*)    Hemoglobin 10.1 (*)    HCT 30.5 (*)    All other components within normal limits  TROPONIN I - Abnormal; Notable for the following:    Troponin I 0.06 (*)    All other components within normal limits  Troponin is elevated and review of laboratory work otherwise seem typical for the patient  EKG:  ED ECG REPORT I, Jennye Moccasin, the attending physician, personally viewed and interpreted this ECG.  Date: 09/12/2015 EKG Time: 2110 Rate: 111 Rhythm: Sinus tachycardia with occasional unifocal PVC QRS Axis: normal Intervals: normal ST/T Wave abnormalities: normal Conduction Disturbances: none Narrative Interpretation: unremarkable Low-voltage QRS poor R-wave progression in the anterior leads No acute ischemic changes   Radiology:   Final result by Rad Results In Interface (09/12/15 21:44:03)   Narrative:   CLINICAL DATA: Chest pain beginning at 11 a.m. this morning.  EXAM: CHEST 2 VIEW  COMPARISON: PA and lateral scratch the single view of the chest 08/23/2015. PA and lateral chest 07/09/2015.  FINDINGS: A new right IJ approach dialysis catheter is in place with the tip in the lower superior vena cava. Heart size is upper normal with vascular congestion identified. No consolidative process, pneumothorax or effusion. No focal bony abnormality.  IMPRESSION: No acute disease.   Electronically Signed By: Drusilla Kanner M.D. On: 09/12/2015 21:44  I personally reviewed the radiologic studies   ED Course: Patient's stay here was uneventful and given her differential concern was mainly around unstable angina. Patient does seem to have an elevated troponin level which did not present itself and on earlier laboratory evaluation. The patient's otherwise hemodynamically stable.    Assessment: Acute coronary syndrome   Final Clinical Impression:  Final diagnoses:  Acute coronary syndrome [facility])     Plan: * Inpatient management           Jennye Moccasin, MD 09/12/15 2333

## 2015-09-13 ENCOUNTER — Observation Stay
Admit: 2015-09-13 | Discharge: 2015-09-13 | Disposition: A | Discharge status: Home / Self Care | Payer: Medicare HMO | Attending: Cardiology | Admitting: Cardiology

## 2015-09-13 DIAGNOSIS — E1122 Type 2 diabetes mellitus with diabetic chronic kidney disease: Secondary | ICD-10-CM | POA: Diagnosis not present

## 2015-09-13 LAB — ECHOCARDIOGRAM COMPLETE
AV Area VTI: 2.17 cm2
AV Peak grad: 9 mmHg
AV peak Index: 0.94
AVPKVEL: 154 cm/s
Ao pk vel: 0.63 m/s
EERAT: 19.63
EWDT: 237 ms
FS: 37 % (ref 28–44)
Height: 69 in
IV/PV OW: 0.84
LA ID, A-P, ES: 46 mm
LA diam end sys: 46 mm
LA diam index: 1.98 cm/m2
LA vol A4C: 68.9 ml
LA vol index: 36.4 mL/m2
LA vol: 84.4 mL
LDCA: 3.46 cm2
LV TDI E'LATERAL: 6.42
LV TDI E'MEDIAL: 5.33
LV e' LATERAL: 6.42 cm/s
LVEEAVG: 19.63
LVEEMED: 19.63
LVOTD: 21 mm
LVOTPV: 96.5 cm/s
MV Dec: 237
MV Peak grad: 6 mmHg
MV pk A vel: 101 m/s
MV pk E vel: 126 m/s
PW: 17.6 mm — AB (ref 0.6–1.1)
RV TAPSE: 16.3 mm
Weight: 4184 oz

## 2015-09-13 LAB — GLUCOSE, CAPILLARY
Glucose-Capillary: 142 mg/dL — ABNORMAL HIGH (ref 65–99)
Glucose-Capillary: 166 mg/dL — ABNORMAL HIGH (ref 65–99)
Glucose-Capillary: 155 mg/dL — ABNORMAL HIGH (ref 65–99)
Glucose-Capillary: 170 mg/dL — ABNORMAL HIGH (ref 65–99)
Glucose-Capillary: 163 mg/dL — ABNORMAL HIGH (ref 65–99)

## 2015-09-13 LAB — TROPONIN I
Troponin I: 0.08 ng/mL — ABNORMAL HIGH (ref <0.031)
Troponin I: 0.08 ng/mL — ABNORMAL HIGH (ref <0.031)
Troponin I: 0.07 ng/mL — ABNORMAL HIGH (ref <0.031)

## 2015-09-13 LAB — HEMOGLOBIN A1C: Hgb A1c MFr Bld: 6.2 % — ABNORMAL HIGH (ref 4.0–6.0)

## 2015-09-13 LAB — TSH: TSH: 9.502 u[IU]/mL — ABNORMAL HIGH (ref 0.350–4.500)

## 2015-09-13 LAB — MRSA PCR SCREENING: MRSA by PCR: NEGATIVE

## 2015-09-13 MED ORDER — DOXYCYCLINE HYCLATE 100 MG PO TABS
100.0000 mg | ORAL_TABLET | Freq: Two times a day (BID) | ORAL | Status: DC
  Administered 2015-09-13 – 2015-09-15 (×4): 100 mg via ORAL
  Filled 2015-09-13 (×5): qty 1

## 2015-09-13 MED ORDER — ERYTHROMYCIN BASE 250 MG PO TBEC
250.0000 mg | DELAYED_RELEASE_TABLET | Freq: Four times a day (QID) | ORAL | Status: DC
  Administered 2015-09-13 – 2015-09-15 (×6): 250 mg via ORAL
  Filled 2015-09-13 (×17): qty 1

## 2015-09-13 MED ORDER — ISOSORBIDE MONONITRATE ER 60 MG PO TB24
60.0000 mg | ORAL_TABLET | Freq: Every day | ORAL | Status: DC
  Administered 2015-09-13 – 2015-09-15 (×2): 60 mg via ORAL
  Filled 2015-09-13 (×3): qty 1

## 2015-09-13 MED ORDER — SODIUM CHLORIDE 0.9% FLUSH: 3.0000 mL | INTRAVENOUS | Status: DC | PRN

## 2015-09-13 MED ORDER — METOPROLOL TARTRATE 50 MG PO TABS
50.0000 mg | ORAL_TABLET | Freq: Two times a day (BID) | ORAL | Status: DC
  Administered 2015-09-13 – 2015-09-15 (×4): 50 mg via ORAL
  Filled 2015-09-13 (×6): qty 1

## 2015-09-13 MED ORDER — OXYCODONE HCL 5 MG PO TABS
5.0000 mg | ORAL_TABLET | Freq: Four times a day (QID) | ORAL | Status: DC | PRN
  Administered 2015-09-13 – 2015-09-15 (×5): 5 mg via ORAL
  Filled 2015-09-13 (×5): qty 1

## 2015-09-13 MED ORDER — MORPHINE SULFATE (PF) 2 MG/ML IV SOLN
INTRAVENOUS | Status: AC
  Administered 2015-09-13: 2 mg via INTRAVENOUS
  Filled 2015-09-13: qty 1

## 2015-09-13 MED ORDER — SODIUM CHLORIDE 0.9% FLUSH
3.0000 mL | Freq: Two times a day (BID) | INTRAVENOUS | Status: DC
  Administered 2015-09-14: 3 mL via INTRAVENOUS

## 2015-09-13 MED ORDER — POTASSIUM CHLORIDE IN NACL 20-0.9 MEQ/L-% IV SOLN
INTRAVENOUS | Status: DC
  Administered 2015-09-13: 04:00:00 via INTRAVENOUS
  Filled 2015-09-13 (×3): qty 1000

## 2015-09-13 MED ORDER — CALCITRIOL 0.25 MCG PO CAPS
0.2500 ug | ORAL_CAPSULE | Freq: Every day | ORAL | Status: DC
  Administered 2015-09-13 – 2015-09-15 (×2): 0.25 ug via ORAL
  Filled 2015-09-13 (×3): qty 1

## 2015-09-13 MED ORDER — TORSEMIDE 20 MG PO TABS
100.0000 mg | ORAL_TABLET | Freq: Every day | ORAL | Status: DC
  Administered 2015-09-13 – 2015-09-15 (×2): 100 mg via ORAL
  Filled 2015-09-13 (×3): qty 5

## 2015-09-13 MED ORDER — ONDANSETRON HCL 4 MG/2ML IJ SOLN: 4.0000 mg | Freq: Four times a day (QID) | INTRAMUSCULAR | Status: DC | PRN

## 2015-09-13 MED ORDER — HEPARIN SODIUM (PORCINE) 5000 UNIT/ML IJ SOLN
5000.0000 [IU] | Freq: Three times a day (TID) | INTRAMUSCULAR | Status: DC
  Administered 2015-09-13 – 2015-09-15 (×4): 5000 [IU] via SUBCUTANEOUS
  Filled 2015-09-13 (×4): qty 1

## 2015-09-13 MED ORDER — DOCUSATE SODIUM 100 MG PO CAPS
100.0000 mg | ORAL_CAPSULE | Freq: Every day | ORAL | Status: DC
  Administered 2015-09-13 – 2015-09-15 (×2): 100 mg via ORAL
  Filled 2015-09-13 (×2): qty 1

## 2015-09-13 MED ORDER — CALCIUM CARBONATE ANTACID 500 MG PO CHEW
1.0000 | CHEWABLE_TABLET | Freq: Three times a day (TID) | ORAL | Status: DC
  Administered 2015-09-13 – 2015-09-15 (×5): 200 mg via ORAL
  Filled 2015-09-13 (×7): qty 1

## 2015-09-13 MED ORDER — POLYETHYLENE GLYCOL 3350 17 G PO PACK
17.0000 g | PACK | Freq: Two times a day (BID) | ORAL | Status: DC
  Administered 2015-09-13 – 2015-09-15 (×2): 17 g via ORAL
  Filled 2015-09-13 (×2): qty 1

## 2015-09-13 MED ORDER — PANTOPRAZOLE SODIUM 40 MG PO TBEC
80.0000 mg | DELAYED_RELEASE_TABLET | Freq: Every day | ORAL | Status: DC
  Administered 2015-09-13 – 2015-09-15 (×2): 80 mg via ORAL
  Filled 2015-09-13 (×3): qty 2

## 2015-09-13 MED ORDER — GUAIFENESIN-DM 100-10 MG/5ML PO SYRP
5.0000 mL | ORAL_SOLUTION | ORAL | Status: DC | PRN
  Administered 2015-09-14 – 2015-09-15 (×3): 5 mL via ORAL
  Filled 2015-09-13 (×4): qty 5

## 2015-09-13 MED ORDER — ONDANSETRON HCL 4 MG PO TABS: 4.0000 mg | ORAL_TABLET | Freq: Four times a day (QID) | ORAL | Status: DC | PRN

## 2015-09-13 MED ORDER — NYSTATIN 100000 UNIT/GM EX POWD
1.0000 g | Freq: Two times a day (BID) | CUTANEOUS | Status: DC
  Administered 2015-09-13 – 2015-09-15 (×2): 1 g via TOPICAL
  Filled 2015-09-13: qty 15

## 2015-09-13 MED ORDER — HYDROCORTISONE ACETATE 25 MG RE SUPP
25.0000 mg | Freq: Every day | RECTAL | Status: DC
  Filled 2015-09-13: qty 1

## 2015-09-13 MED ORDER — METOCLOPRAMIDE HCL 10 MG PO TABS
10.0000 mg | ORAL_TABLET | Freq: Four times a day (QID) | ORAL | Status: DC
  Administered 2015-09-13 (×4): 10 mg via ORAL
  Filled 2015-09-13 (×7): qty 1

## 2015-09-13 MED ORDER — BISACODYL 10 MG RE SUPP
10.0000 mg | Freq: Every day | RECTAL | Status: DC | PRN
Start: 2015-09-13 — End: 2015-09-15

## 2015-09-13 MED ORDER — CALCIUM ACETATE (PHOS BINDER) 667 MG PO CAPS
2001.0000 mg | ORAL_CAPSULE | Freq: Three times a day (TID) | ORAL | Status: DC
  Administered 2015-09-13 – 2015-09-15 (×6): 2001 mg via ORAL
  Filled 2015-09-13 (×7): qty 3

## 2015-09-13 MED ORDER — ASPIRIN EC 81 MG PO TBEC
81.0000 mg | DELAYED_RELEASE_TABLET | Freq: Every day | ORAL | Status: DC
  Administered 2015-09-13 – 2015-09-14 (×2): 81 mg via ORAL
  Filled 2015-09-13 (×2): qty 1

## 2015-09-13 MED ORDER — SODIUM CHLORIDE 0.9% FLUSH
3.0000 mL | Freq: Two times a day (BID) | INTRAVENOUS | Status: DC
  Administered 2015-09-13 – 2015-09-15 (×5): 3 mL via INTRAVENOUS

## 2015-09-13 MED ORDER — SODIUM CHLORIDE 0.9 % IV SOLN
INTRAVENOUS | Status: DC
  Administered 2015-09-14: 06:00:00 via INTRAVENOUS

## 2015-09-13 MED ORDER — SENNA 8.6 MG PO TABS
2.0000 | ORAL_TABLET | Freq: Two times a day (BID) | ORAL | Status: DC
  Administered 2015-09-13 – 2015-09-15 (×3): 17.2 mg via ORAL
  Filled 2015-09-13 (×4): qty 2

## 2015-09-13 MED ORDER — RANOLAZINE ER 500 MG PO TB12
500.0000 mg | ORAL_TABLET | Freq: Two times a day (BID) | ORAL | Status: DC
  Administered 2015-09-13 – 2015-09-15 (×4): 500 mg via ORAL
  Filled 2015-09-13 (×5): qty 1

## 2015-09-13 MED ORDER — SENNA 8.6 MG PO TABS: 2.0000 | ORAL_TABLET | Freq: Every evening | ORAL | Status: DC | PRN

## 2015-09-13 MED ORDER — METOLAZONE 5 MG PO TABS
5.0000 mg | ORAL_TABLET | Freq: Every day | ORAL | Status: DC | PRN
  Filled 2015-09-13: qty 1

## 2015-09-13 MED ORDER — NICOTINE 14 MG/24HR TD PT24
14.0000 mg | MEDICATED_PATCH | Freq: Every day | TRANSDERMAL | Status: DC
  Administered 2015-09-13 – 2015-09-15 (×2): 14 mg via TRANSDERMAL
  Filled 2015-09-13 (×3): qty 1

## 2015-09-13 MED ORDER — RISPERIDONE 1 MG PO TABS
1.0000 mg | ORAL_TABLET | Freq: Two times a day (BID) | ORAL | Status: DC
  Administered 2015-09-13 – 2015-09-15 (×4): 1 mg via ORAL
  Filled 2015-09-13 (×6): qty 1

## 2015-09-13 MED ORDER — POTASSIUM CHLORIDE 20 MEQ/15ML (10%) PO SOLN
40.0000 meq | Freq: Once | ORAL | Status: DC
  Filled 2015-09-13: qty 30

## 2015-09-13 MED ORDER — INSULIN ASPART 100 UNIT/ML ~~LOC~~ SOLN
0.0000 [IU] | Freq: Three times a day (TID) | SUBCUTANEOUS | Status: DC
Start: 2015-09-13 — End: 2015-09-15
  Administered 2015-09-13 (×3): 2 [IU] via SUBCUTANEOUS
  Administered 2015-09-15 (×2): 1 [IU] via SUBCUTANEOUS
  Filled 2015-09-13 (×2): qty 2
  Filled 2015-09-13: qty 1
  Filled 2015-09-13: qty 2

## 2015-09-13 MED ORDER — ONDANSETRON 4 MG PO TBDP
4.0000 mg | ORAL_TABLET | Freq: Four times a day (QID) | ORAL | Status: DC | PRN
  Filled 2015-09-13: qty 1

## 2015-09-13 MED ORDER — MORPHINE SULFATE (PF) 2 MG/ML IV SOLN
2.0000 mg | Freq: Once | INTRAVENOUS | Status: AC
  Administered 2015-09-13: 2 mg via INTRAVENOUS

## 2015-09-13 MED ORDER — FUROSEMIDE 10 MG/ML IJ SOLN
40.0000 mg | Freq: Once | INTRAMUSCULAR | Status: AC
  Administered 2015-09-13: 40 mg via INTRAVENOUS
  Filled 2015-09-13: qty 4

## 2015-09-13 MED ORDER — DIVALPROEX SODIUM 500 MG PO DR TAB
1000.0000 mg | DELAYED_RELEASE_TABLET | Freq: Three times a day (TID) | ORAL | Status: DC
  Administered 2015-09-13 – 2015-09-15 (×6): 1000 mg via ORAL
  Filled 2015-09-13 (×7): qty 2

## 2015-09-13 MED ORDER — LACTULOSE 10 GM/15ML PO SOLN: 10.0000 g | Freq: Every day | ORAL | Status: DC | PRN

## 2015-09-13 MED ORDER — LEVETIRACETAM 500 MG PO TABS
500.0000 mg | ORAL_TABLET | Freq: Every morning | ORAL | Status: DC
  Administered 2015-09-13 – 2015-09-15 (×2): 500 mg via ORAL
  Filled 2015-09-13 (×3): qty 1

## 2015-09-13 MED ORDER — ASPIRIN 81 MG PO CHEW
81.0000 mg | CHEWABLE_TABLET | ORAL | Status: AC
  Administered 2015-09-14: 81 mg via ORAL
  Filled 2015-09-13: qty 1

## 2015-09-13 MED ORDER — CLOPIDOGREL BISULFATE 75 MG PO TABS
75.0000 mg | ORAL_TABLET | Freq: Every day | ORAL | Status: DC
  Administered 2015-09-13 – 2015-09-14 (×2): 75 mg via ORAL
  Filled 2015-09-13 (×2): qty 1

## 2015-09-13 MED ORDER — NITROGLYCERIN 0.4 MG SL SUBL: 0.4000 mg | SUBLINGUAL_TABLET | SUBLINGUAL | Status: DC | PRN

## 2015-09-13 MED ORDER — SODIUM CHLORIDE 0.9 % IV SOLN: 250.0000 mL | INTRAVENOUS | Status: DC | PRN

## 2015-09-13 MED ORDER — INSULIN GLARGINE 100 UNIT/ML ~~LOC~~ SOLN
5.0000 [IU] | Freq: Every day | SUBCUTANEOUS | Status: DC
  Administered 2015-09-13 – 2015-09-15 (×2): 5 [IU] via SUBCUTANEOUS
  Filled 2015-09-13 (×4): qty 0.05

## 2015-09-13 MED ORDER — POTASSIUM CHLORIDE 20 MEQ PO PACK
PACK | ORAL | Status: AC
  Filled 2015-09-13: qty 2

## 2015-09-13 MED ORDER — ACETAMINOPHEN 325 MG PO TABS: 650.0000 mg | ORAL_TABLET | Freq: Four times a day (QID) | ORAL | Status: DC | PRN

## 2015-09-13 MED ORDER — ALBUTEROL SULFATE (2.5 MG/3ML) 0.083% IN NEBU: 2.5000 mg | INHALATION_SOLUTION | RESPIRATORY_TRACT | Status: DC | PRN

## 2015-09-13 MED ORDER — LISINOPRIL 20 MG PO TABS
40.0000 mg | ORAL_TABLET | Freq: Every day | ORAL | Status: DC
  Administered 2015-09-13 – 2015-09-15 (×2): 40 mg via ORAL
  Filled 2015-09-13 (×5): qty 2

## 2015-09-13 NOTE — Progress Notes (Signed)
[facility]  ROUNDING NOTE   Subjective:  Patient well-known to [place]. She had hemodialysis yesterday. After dialysis she developed an episode of chest pain that was located in the center of her chest with radiation into the left arm. Minimal troponin elevations noted. She is being taken for cardiac catheterization again.    Objective:  Vital signs in last 24 hours:  Temp:  [98 F (36.7 C)-99 F (37.2 C)] 98.3 F (36.8 C) (06/14 1157) Pulse Rate:  [65-111] 75 (06/14 1157) Resp:  [0-22] 22 (06/14 1157) BP: (120-180)/(57-108) 155/57 mmHg (06/14 1157) SpO2:  [90 %-98 %] 94 % (06/14 1157) Weight:  [118.616 kg (261 lb 8 oz)-120.203 kg (265 lb)] 118.616 kg (261 lb 8 oz) (06/14 0518)  Weight change:  Filed Weights   09/12/15 2109 09/13/15 0518  Weight: 120.203 kg (265 lb) 118.616 kg (261 lb 8 oz)    Intake/Output:     Intake/Output this shift:  Total I/O In: 360 [P.O.:360] Out: -   Physical Exam: General: NAD, resting in bed  Head: Normocephalic, atraumatic. Moist oral mucosal membranes  Eyes: Anicteric  Neck: Supple, trachea midline  Lungs:  Clear to auscultation  Heart: S1S2 no rubs  Abdomen:  Soft, nontender, BS present   Extremities: Trace LLE edema, RLE amputation.  Neurologic: Nonfocal, moving all four extremities  Skin: No lesions  Access: R IJ permcath    Basic Metabolic Panel:  Recent Labs Lab 09/12/15 2117  NA 138  K 3.4*  CL 98*  CO2 31  GLUCOSE 210*  BUN 26*  CREATININE 3.83*  CALCIUM 9.3    Liver Function Tests: No results for input(s): AST, ALT, ALKPHOS, BILITOT, PROT, ALBUMIN in the last 168 hours. No results for input(s): LIPASE, AMYLASE in the last 168 hours. No results for input(s): AMMONIA in the last 168 hours.  CBC:  Recent Labs Lab 09/12/15 2117  WBC 6.9  HGB 10.1*  HCT 30.5*  MCV 84.8  PLT 328    Cardiac Enzymes:  Recent Labs Lab 09/12/15 2117 09/13/15 0227 09/13/15 0826 09/13/15 1423  TROPONINI  0.06* 0.08* 0.08* 0.07*    BNP: Invalid input(s): POCBNP  CBG:  Recent Labs Lab 09/13/15 0239 09/13/15 0718 09/13/15 1128  GLUCAP 142* 166* 155*    Microbiology: Results for orders placed or performed during the hospital encounter of 09/12/15  MRSA PCR Screening     Status: None   Collection Time: 09/13/15  6:08 AM  Result Value Ref Range Status   MRSA by PCR NEGATIVE NEGATIVE Final    Comment:        The GeneXpert MRSA Assay (FDA approved for NASAL specimens only), is one component of a comprehensive MRSA colonization surveillance program. It is not intended to diagnose MRSA infection nor to guide or monitor treatment for MRSA infections.     Coagulation Studies: No results for input(s): LABPROT, INR in the last 72 hours.  Urinalysis: No results for input(s): COLORURINE, LABSPEC, PHURINE, GLUCOSEU, HGBUR, BILIRUBINUR, KETONESUR, PROTEINUR, UROBILINOGEN, NITRITE, LEUKOCYTESUR in the last 72 hours.  Invalid input(s): APPERANCEUR    Imaging: Dg Chest 2 View  09/12/2015  CLINICAL DATA:  Chest pain beginning at 11 a.m. this morning. EXAM: CHEST  2 VIEW COMPARISON:  PA and lateral scratch the single view of the chest 08/23/2015. PA and lateral chest 07/09/2015. FINDINGS: A new right IJ approach dialysis catheter is in place with the tip in the lower superior vena cava. Heart size is upper normal with vascular congestion identified.  No consolidative process, pneumothorax or effusion. No focal bony abnormality. IMPRESSION: No acute disease. Electronically Signed   By: Drusilla Kanner M.D.   On: 09/12/2015 21:44     Medications:     . aspirin EC  81 mg Oral Daily  . calcitRIOL  0.25 mcg Oral Daily  . calcium acetate  2,001 mg Oral TID WC  . calcium carbonate  1 tablet Oral TID WC  . clopidogrel  75 mg Oral Daily  . divalproex  1,000 mg Oral TID  . docusate sodium  100 mg Oral QHS  . doxycycline  100 mg Oral BID  . erythromycin  250 mg Oral QID  . heparin  5,000  Units Subcutaneous Q8H  . hydrocortisone  25 mg Rectal Daily  . insulin aspart  0-9 Units Subcutaneous TID WC  . insulin glargine  5 Units Subcutaneous QHS  . isosorbide mononitrate  60 mg Oral Daily  . levETIRAcetam  500 mg Oral q morning - 10a  . lisinopril  40 mg Oral Daily  . metoCLOPramide  10 mg Oral QID  . metoprolol tartrate  50 mg Oral BID  . nicotine  14 mg Transdermal Daily  . nystatin  1 g Topical BID  . pantoprazole  80 mg Oral Daily  . polyethylene glycol  17 g Oral BID  . ranolazine  500 mg Oral BID  . risperiDONE  1 mg Oral BID  . senna  2 tablet Oral BID  . sodium chloride flush  3 mL Intravenous Q12H  . torsemide  100 mg Oral Daily   acetaminophen, bisacodyl, lactulose, metolazone, nitroGLYCERIN, ondansetron **OR** ondansetron (ZOFRAN) IV, ondansetron, oxyCODONE, senna  Assessment/ Plan:  50 y.o. female  with past medical history of end-stage renal disease on peritoneal dialysis, asthma, gastroparesis, diabetes mellitus, congestive heart failure, hypertension, endometriosis, coronary atherosclerosis/medical management, PD peritonitis with micrococcus treated with meropenem and IV/IP vancomycin  [facility]/[address]/peritoneal dialysis  1. 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  No urgent indication for dialysis today.  We will plan for dialysis again tomorrow.  2. Anemia of CKD:  - Hemoglobin currently 10.1.  Consider restarting Epogen with dialysis.  3. Hypertension:  - currently on lisinopril.  4. Secondary Hyperparathyroidism: check intact PTH and phosphorus with dialysis tomorrow. Continue calcium acetate at this time.  5.  Chest pain. The patient has known underlying coronary artery disease.  However she developed another episode of chest pain.  Cardiology is taking her for another cardiac catheterization.   LOS:  Kimberly Montgomery 6/14/20173:08 PM

## 2015-09-13 NOTE — Consult Note (Signed)
Kimberly Montgomery is a 51 y.o. female  [id number]  Primary Cardiologist: Adrian Blackwater Reason for Consultation: Unstable angina  HPI: The patient is admitted with chest pain. This started yesterday morning with shortness of breath first, followed by substernal chest pain and aching from left elbow to wrist. SHe took 2 nitroglycerin and pain resolved. Later in the afternoon she again developed chest pain and was given 3 nitroglycerins by the staff at [facility] and then they called for an ambulance.  SHe has had a cardiac cath done by Dr Welton Flakes on 07/10/2015 and found to have 35% stenosis of the RCA at site of prior stent, 80% stenosis of 1st OM, 60% stenosis of mid LAD, 80% stenosis of distal LAD and 70% stenosis of mid circumflex artery. The vessels were too small to stent and it was decided to try medical therapy.  She was seen once in our office for follow up, but did not return after that.  She has ESRD and hemodialysis Monday, Wednesday and Friday. SHe had dialysis yesterday. Also hypertension, CHF, diabetes, amputation right leg, CAD, HLD, and GERD.   Review of Systems: Mild chest pressure and shortness of breath with exertion    Past Medical History  Diagnosis Date  . Renal disorder   . HypertensionM   . CHF (congestive heart failure) (HCC)   . Diabetes mellitus without complication (HCC)   . Gastroparesis   . Asthma   . ESRD (end stage renal disease) (HCC)   . Endometriosis   . CAD (coronary artery disease)   . HLD (hyperlipidemia)   . GERD (gastroesophageal reflux disease)     Medications Prior to Admission  Medication Sig Dispense Refill  . acetaminophen (TYLENOL) 325 MG tablet Take 650 mg by mouth every 6 (six) hours as needed for mild pain.     [name] aspirin EC 81 MG tablet Take 81 mg by mouth daily.    . bisacodyl (DULCOLAX) 10 MG suppository Place 10 mg rectally daily as needed for moderate constipation. Use if no relief from Milk of Magnesia    . calcitRIOL  (ROCALTROL) 0.25 MCG capsule Take 0.25 mcg by mouth daily.    . calcium acetate (PHOSLO) 667 MG capsule Take 3 capsules (2,001 mg total) by mouth 3 (three) times daily with meals. 90 capsule 0  . calcium carbonate (TUMS - DOSED IN MG ELEMENTAL CALCIUM) 500 MG chewable tablet Chew 1 tablet (200 mg of elemental calcium total) by mouth 3 (three) times daily with meals. 120 tablet 0  . clopidogrel (PLAVIX) 75 MG tablet Take 75 mg by mouth daily.    . divalproex (DEPAKOTE) 500 MG DR tablet Take 1,000 mg by mouth 3 (three) times daily.    [name] docusate sodium (COLACE) 100 MG capsule Take 100 mg by mouth at bedtime.     [name] doxycycline (VIBRA-TABS) 100 MG tablet Take 100 mg by mouth 2 (two) times daily.    [name] erythromycin (E-MYCIN) 250 MG tablet Take 250 mg by mouth 4 (four) times daily.    . hydrocortisone (ANUSOL-HC) 25 MG suppository Place 25 mg rectally daily.    . insulin aspart (NOVOLOG) 100 UNIT/ML injection Inject 0-9 Units into the skin 3 (three) times daily with meals. 15 units three times a day with meals, 5 units with snacks    . insulin glargine (LANTUS) 100 UNIT/ML injection Inject 0.08 mLs (8 Units total) into the skin at bedtime. 10 mL 11  . isosorbide mononitrate (IMDUR) 60 MG 24  hr tablet Take 1 tablet (60 mg total) by mouth daily. 30 tablet 00  . lactulose (CHRONULAC) 10 GM/15ML solution Take 10 g by mouth daily as needed for mild constipation.    . levETIRAcetam (KEPPRA) 500 MG tablet Take 1 tablet (500 mg total) by mouth every morning. 30 tablet 0  . lisinopril (PRINIVIL,ZESTRIL) 40 MG tablet Take 40 mg by mouth daily.  3  . metoCLOPramide (REGLAN) 10 MG tablet Take 1 tablet (10 mg total) by mouth 4 (four) times daily. 90 tablet 0  . metolazone (ZAROXOLYN) 5 MG tablet Take 5 mg by mouth daily as needed (fluid).    . nicotine (NICODERM CQ - DOSED IN MG/24 HOURS) 14 mg/24hr patch Place 14 mg onto the skin daily.    . nitroGLYCERIN (NITROSTAT) 0.4 MG SL tablet Place 0.4 mg under the tongue  every 5 (five) minutes as needed for chest pain. Reported on 07/10/2015    . nystatin (MYCOSTATIN) powder Apply 1 g topically 2 (two) times daily.    [name] omeprazole (PRILOSEC) 20 MG capsule Take 40 mg by mouth 2 (two) times daily before a meal.    . ondansetron (ZOFRAN ODT) 4 MG disintegrating tablet Take 1 tablet (4 mg total) by mouth every 6 (six) hours as needed for nausea or vomiting. 20 tablet 0  . oxyCODONE (OXY IR/ROXICODONE) 5 MG immediate release tablet Take 1 tablet (5 mg total) by mouth every 6 (six) hours as needed for severe pain. 30 tablet 0  . polyethylene glycol (MIRALAX / GLYCOLAX) packet Take 17 g by mouth daily. (Patient taking differently: Take 17 g by mouth 2 (two) times daily. ) 14 each 0  . ranolazine (RANEXA) 500 MG 12 hr tablet Take 1 tablet (500 mg total) by mouth 2 (two) times daily. 60 tablet 0  . risperiDONE (RISPERDAL) 1 MG tablet Take 1 mg by mouth 2 (two) times daily.     [name] senna (SENOKOT) 8.6 MG TABS tablet Take 2 tablets by mouth at bedtime as needed for mild constipation.    . senna (SENOKOT) 8.6 MG TABS tablet Take 2 tablets by mouth 2 (two) times daily.    [name] torsemide (DEMADEX) 100 MG tablet Take 100 mg by mouth daily.       [name] aspirin EC  81 mg Oral Daily  . calcitRIOL  0.25 mcg Oral Daily  . calcium acetate  2,001 mg Oral TID WC  . calcium carbonate  1 tablet Oral TID WC  . clopidogrel  75 mg Oral Daily  . divalproex  1,000 mg Oral TID  . docusate sodium  100 mg Oral QHS  . doxycycline  100 mg Oral BID  . erythromycin  250 mg Oral QID  . heparin  5,000 Units Subcutaneous Q8H  . hydrocortisone  25 mg Rectal Daily  . insulin aspart  0-9 Units Subcutaneous TID WC  . insulin glargine  5 Units Subcutaneous QHS  . isosorbide mononitrate  60 mg Oral Daily  . levETIRAcetam  500 mg Oral q morning - 10a  . lisinopril  40 mg Oral Daily  . metoCLOPramide  10 mg Oral QID  . metoprolol tartrate  50 mg Oral BID  . nicotine  14 mg Transdermal Daily  . nystatin  1 g  Topical BID  . pantoprazole  80 mg Oral Daily  . polyethylene glycol  17 g Oral BID  . ranolazine  500 mg Oral BID  . risperiDONE  1 mg Oral BID  . senna  2 tablet Oral BID  . sodium chloride flush  3 mL Intravenous Q12H  . torsemide  100 mg Oral Daily    Infusions:    Allergies  Allergen Reactions  . Cephalosporins Anaphylaxis  . Penicillins Anaphylaxis and Other (See Comments)    Has patient had a PCN reaction causing immediate rash, facial/tongue/throat swelling, SOB or lightheadedness with hypotension: Yes Has patient had a PCN reaction causing severe rash involving mucus membranes or skin necrosis: No Has patient had a PCN reaction that required hospitalization No Has patient had a PCN reaction occurring within the last 10 years: No If all of the above answers are "NO", then may proceed with Cephalosporin use.  . Lamictal [Lamotrigine] Other (See Comments)    Reaction:  Hallucinations  . Phenergan [Promethazine Hcl] Nausea And Vomiting  . Pravastatin Other (See Comments)    Reaction:  Muscle pain   . Sulfa Antibiotics Other (See Comments)    Reaction:  Unknown     Social History   Social History  . Marital Status: Widowed    Spouse Name: N/A  . Number of Children: N/A  . Years of Education: N/A   Occupational History  . disabled    Social History Main Topics  . Smoking status: Current Every Day Smoker -- 0.50 packs/day    Types: Cigarettes  . Smokeless tobacco: Not on file  . Alcohol Use: No  . Drug Use: No  . Sexual Activity: No   Other Topics Concern  . Not on file   Social History Narrative    Family History  Problem Relation Age of Onset  . CAD    . Diabetes    . Bipolar disorder    . Cervical cancer Mother     PHYSICAL EXAM: Filed Vitals:   09/13/15 0758 09/13/15 0824  BP: 166/66 120/58  Pulse: 89 65  Temp: 98 F (36.7 C) 99 F (37.2 C)  Resp: 20 22     Intake/Output Summary (Last 24 hours) at 09/13/15 1005 Last data filed at  09/13/15 0941  Gross per 24 hour  Intake    240 ml  Output      0 ml  Net    240 ml    General:  Well appearing. No respiratory difficulty HEENT: normal Neck: supple. no JVD. Carotids 2+ bilat; no bruits. No  Cor: PMI nondisplaced. Regular rate & rhythm. No rubs, gallops or murmurs. Lungs: Rales in lower lungs Abdomen: soft, nontender, nondistended.  Extremities: no cyanosis, clubbing, rash, 1+ ankle edema Neuro: alert & oriented x 3, cranial nerves grossly intact. moves all 4 extremities w/o difficulty. Affect pleasant.  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, no new ST/T changes  Results for orders placed or performed during the hospital encounter of 09/12/15 (from the past 24 hour(s))  Basic metabolic panel     Status: Abnormal   Collection Time: 09/12/15  9:17 PM  Result Value Ref Range   Sodium 138 135 - 145 mmol/L   Potassium 3.4 (L) 3.5 - 5.1 mmol/L   Chloride 98 (L) 101 - 111 mmol/L   CO2 31 22 - 32 mmol/L   Glucose, Bld 210 (H) 65 - 99 mg/dL   BUN 26 (H) 6 - 20 mg/dL   Creatinine, Ser 5.40 (H) 0.44 - 1.00 mg/dL   Calcium 9.3 8.9 - 98.1 mg/dL   GFR calc non Af Amer 13 (L) >60 mL/min   GFR calc Af Amer 15 (L) >60 mL/min  Anion gap 9 5 - 15  CBC     Status: Abnormal   Collection Time: 09/12/15  9:17 PM  Result Value Ref Range   WBC 6.9 3.6 - 11.0 K/uL   RBC 3.59 (L) 3.80 - 5.20 MIL/uL   Hemoglobin 10.1 (L) 12.0 - 16.0 g/dL   HCT 52.7 (L) 78.2 - 42.3 %   MCV 84.8 80.0 - 100.0 fL   MCH 28.1 26.0 - 34.0 pg   MCHC 33.1 32.0 - 36.0 g/dL   RDW 53.6 14.4 - 31.5 %   Platelets 328 150 - 440 K/uL  Troponin I     Status: Abnormal   Collection Time: 09/12/15  9:17 PM  Result Value Ref Range   Troponin I 0.06 (H) <0.031 ng/mL  TSH     Status: Abnormal   Collection Time: 09/13/15  2:27 AM  Result Value Ref Range   TSH 9.502 (H) 0.350 - 4.500 uIU/mL  Troponin I     Status: Abnormal   Collection Time: 09/13/15  2:27 AM  Result Value Ref Range    Troponin I 0.08 (H) <0.031 ng/mL  Glucose, capillary     Status: Abnormal   Collection Time: 09/13/15  2:39 AM  Result Value Ref Range   Glucose-Capillary 142 (H) 65 - 99 mg/dL  MRSA PCR Screening     Status: None   Collection Time: 09/13/15  6:08 AM  Result Value Ref Range   MRSA by PCR NEGATIVE NEGATIVE  Glucose, capillary     Status: Abnormal   Collection Time: 09/13/15  7:18 AM  Result Value Ref Range   Glucose-Capillary 166 (H) 65 - 99 mg/dL   Comment 1 Notify RN    Comment 2 Document in Chart   Troponin I     Status: Abnormal   Collection Time: 09/13/15  8:26 AM  Result Value Ref Range   Troponin I 0.08 (H) <0.031 ng/mL   Dg Chest 2 View  09/12/2015  CLINICAL DATA:  Chest pain beginning at 11 a.m. this morning. EXAM: CHEST  2 VIEW COMPARISON:  PA and lateral scratch the single view of the chest 08/23/2015. PA and lateral chest 07/09/2015. FINDINGS: A new right IJ approach dialysis catheter is in place with the tip in the lower superior vena cava. Heart size is upper normal with vascular congestion identified. No consolidative process, pneumothorax or effusion. No focal bony abnormality. IMPRESSION: No acute disease. Electronically Signed   By: Drusilla Kanner M.D.   On: 09/12/2015 21:44     ASSESSMENT AND PLAN: Unstable angina with mildly elevated troponins of 0.06, 0.08, 0.08. Pt with known severe 2 vessel CAD, failed maximal medical therapy. Plan cath for tomorrow with dialysis to follow, check echo. Pt may need bypass. Lungs with crackles and pt has lower extremity edema and shortness of breath. Will give lasix IV one dose and nephrology to see pt.   Berton Bon, NP 09/13/2015 10:05 AM

## 2015-09-13 NOTE — Care Management Obs Status (Signed)
MEDICARE OBSERVATION STATUS NOTIFICATION   Patient Details  Name: Kimberly Montgomery MRN: [id number] Date of Birth: 14-Dec-1965   Medicare Observation Status Notification Given:  Yes    Marily Memos, RN 09/13/2015, 9:43 AM

## 2015-09-13 NOTE — NC FL2 (Addendum)
[place] MEDICAID FL2 LEVEL OF CARE SCREENING TOOL     IDENTIFICATION  Patient Name: Kimberly Montgomery Birthdate: [birth year] Sex: female Admission Date (Current Location): 09/12/2015  [place] and [place] Number:  Chiropodist and Address:  [facility], [address], [place], [place] [address]      Provider Number: [id number]  Attending Physician Name and Address:  Milagros Loll, MD  Relative Name and Phone Number:       Current Level of Care: Hospital Recommended Level of Care: Skilled Nursing Facility Prior Approval Number:    Date Approved/Denied:   PASRR Number: [id number]  Discharge Plan: SNF    Current Diagnoses: Patient Active Problem List   Diagnosis Date Noted  . Bipolar I disorder, most recent episode depressed (HCC)   . Altered mental status 08/24/2015  . ESRD (end stage renal disease) (HCC)   . Ileus (HCC)   . Bipolar I disorder (HCC) 07/25/2015  . Seizures (HCC) 07/25/2015  . Peritonitis (HCC) 07/16/2015  . Unstable angina (HCC) 07/09/2015  . ESRD on peritoneal dialysis (HCC) 07/09/2015  . Accelerated hypertension 07/09/2015  . Type 2 diabetes mellitus (HCC) 07/09/2015  . CAD (coronary artery disease) 07/09/2015  . HLD (hyperlipidemia) 07/09/2015  . GERD (gastroesophageal reflux disease) 07/09/2015  . Chest pain 05/12/2015    Orientation RESPIRATION BLADDER Height & Weight     Self, Time, Situation, Place  Normal Continent Weight: 261 lb 8 oz (118.616 kg) Height:  5\' 9"  (175.3 cm) (per pt)  BEHAVIORAL SYMPTOMS/MOOD NEUROLOGICAL BOWEL NUTRITION STATUS   (None)   Continent Diet (heart healthy/carb modified )  AMBULATORY STATUS COMMUNICATION OF NEEDS Skin   Extensive Assist Verbally Other (Comment) (Diabetic Ulcer Right Toe; )                       Personal Care Assistance Level of Assistance  Bathing, Feeding, Dressing Bathing Assistance: Limited assistance Feeding assistance: Independent Dressing  Assistance: Limited assistance     Functional Limitations Info  Sight, Hearing, Speech Sight Info: Adequate Hearing Info: Adequate Speech Info: Adequate    SPECIAL CARE FACTORS FREQUENCY  PT (By licensed PT)     PT Frequency:  (5)              Contractures      Additional Factors Info  Code Status, Allergies, Insulin Sliding Scale Code Status Info:  (Full Code) Allergies Info:  (Cephalosporins, Penicillins, Lamictal, Phenergan, Pravastatin, Sulfa Antibiotics)   Insulin Sliding Scale Info:  (insulin glargine (LANTUS) injection 5 Units 5 Units, Subcutaneous, Daily at bedtime; insulin aspart (novoLOG) injection 0-9 Units 0-9 Units, Subcutaneous, 3 times daily with meals  )       Current Medications (09/13/2015):  This is the current hospital active medication list Current Facility-Administered Medications  Medication Dose Route Frequency Provider Last Rate Last Dose  . 0.9 %  sodium chloride infusion  250 mL Intravenous PRN Laurier Nancy, MD      . Melene Muller ON 09/14/2015] 0.9 %  sodium chloride infusion   Intravenous Continuous Laurier Nancy, MD      . acetaminophen (TYLENOL) tablet 650 mg  650 mg Oral Q6H PRN Arnaldo Natal, MD      . albuterol (PROVENTIL) (2.5 MG/3ML) 0.083% nebulizer solution 2.5 mg  2.5 mg Inhalation Q4H PRN Milagros Loll, MD      . Melene Muller ON 09/14/2015] aspirin chewable tablet 81 mg  81 mg Oral Pre-Cath Laurier Nancy, MD      .  aspirin EC tablet 81 mg  81 mg Oral Daily Arnaldo Natal, MD   81 mg at 09/13/15 0951  . bisacodyl (DULCOLAX) suppository 10 mg  10 mg Rectal Daily PRN Arnaldo Natal, MD      . calcitRIOL (ROCALTROL) capsule 0.25 mcg  0.25 mcg Oral Daily Arnaldo Natal, MD   0.25 mcg at 09/13/15 1142  . calcium acetate (PHOSLO) capsule 2,001 mg  2,001 mg Oral TID WC Arnaldo Natal, MD   2,001 mg at 09/13/15 1720  . calcium carbonate (TUMS - dosed in mg elemental calcium) chewable tablet 200 mg of elemental calcium  1 tablet Oral TID  WC Arnaldo Natal, MD   200 mg of elemental calcium at 09/13/15 1720  . clopidogrel (PLAVIX) tablet 75 mg  75 mg Oral Daily Arnaldo Natal, MD   75 mg at 09/13/15 0951  . divalproex (DEPAKOTE) DR tablet 1,000 mg  1,000 mg Oral TID Arnaldo Natal, MD   1,000 mg at 09/13/15 1721  . docusate sodium (COLACE) capsule 100 mg  100 mg Oral QHS Arnaldo Natal, MD      . doxycycline (VIBRA-TABS) tablet 100 mg  100 mg Oral BID Arnaldo Natal, MD   100 mg at 09/13/15 0955  . erythromycin (ERY-TAB) EC tablet 250 mg  250 mg Oral QID Arnaldo Natal, MD   250 mg at 09/13/15 1720  . heparin injection 5,000 Units  5,000 Units Subcutaneous Q8H Arnaldo Natal, MD   5,000 Units at 09/13/15 1452  . hydrocortisone (ANUSOL-HC) suppository 25 mg  25 mg Rectal Daily Arnaldo Natal, MD   25 mg at 09/13/15 1001  . insulin aspart (novoLOG) injection 0-9 Units  0-9 Units Subcutaneous TID WC Arnaldo Natal, MD   2 Units at 09/13/15 1721  . insulin glargine (LANTUS) injection 5 Units  5 Units Subcutaneous QHS Arnaldo Natal, MD      . isosorbide mononitrate (IMDUR) 24 hr tablet 60 mg  60 mg Oral Daily Arnaldo Natal, MD   60 mg at 09/13/15 0951  . lactulose (CHRONULAC) 10 GM/15ML solution 10 g  10 g Oral Daily PRN Arnaldo Natal, MD      . levETIRAcetam (KEPPRA) tablet 500 mg  500 mg Oral q morning - 10a Arnaldo Natal, MD   500 mg at 09/13/15 0954  . lisinopril (PRINIVIL,ZESTRIL) tablet 40 mg  40 mg Oral Daily Arnaldo Natal, MD   40 mg at 09/13/15 1151  . metoCLOPramide (REGLAN) tablet 10 mg  10 mg Oral QID Arnaldo Natal, MD   10 mg at 09/13/15 1733  . metolazone (ZAROXOLYN) tablet 5 mg  5 mg Oral Daily PRN Arnaldo Natal, MD      . metoprolol (LOPRESSOR) tablet 50 mg  50 mg Oral BID Milagros Loll, MD   50 mg at 09/13/15 0849  . nicotine (NICODERM CQ - dosed in mg/24 hours) patch 14 mg  14 mg Transdermal Daily Arnaldo Natal, MD   14 mg at 09/13/15 0949  . nitroGLYCERIN  (NITROSTAT) SL tablet 0.4 mg  0.4 mg Sublingual Q5 min PRN Arnaldo Natal, MD      . nystatin (MYCOSTATIN) powder 1 g  1 g Topical BID Arnaldo Natal, MD   1 g at 09/13/15 1003  . ondansetron (ZOFRAN) tablet 4 mg  4 mg Oral Q6H PRN Arnaldo Natal, MD       Or  .  ondansetron (ZOFRAN) injection 4 mg  4 mg Intravenous Q6H PRN Arnaldo Natal, MD      . ondansetron (ZOFRAN-ODT) disintegrating tablet 4 mg  4 mg Oral Q6H PRN Arnaldo Natal, MD      . oxyCODONE (Oxy IR/ROXICODONE) immediate release tablet 5 mg  5 mg Oral Q6H PRN Arnaldo Natal, MD      . pantoprazole (PROTONIX) EC tablet 80 mg  80 mg Oral Daily Arnaldo Natal, MD   80 mg at 09/13/15 0953  . polyethylene glycol (MIRALAX / GLYCOLAX) packet 17 g  17 g Oral BID Arnaldo Natal, MD   17 g at 09/13/15 1150  . ranolazine (RANEXA) 12 hr tablet 500 mg  500 mg Oral BID Arnaldo Natal, MD   500 mg at 09/13/15 2952  . risperiDONE (RISPERDAL) tablet 1 mg  1 mg Oral BID Arnaldo Natal, MD   1 mg at 09/13/15 0958  . senna (SENOKOT) tablet 17.2 mg  2 tablet Oral QHS PRN Arnaldo Natal, MD      . senna [facility]) tablet 17.2 mg  2 tablet Oral BID Arnaldo Natal, MD   17.2 mg at 09/13/15 8413  . sodium chloride flush (NS) 0.9 % injection 3 mL  3 mL Intravenous Q12H Arnaldo Natal, MD   3 mL at 09/13/15 0959  . sodium chloride flush (NS) 0.9 % injection 3 mL  3 mL Intravenous Q12H Laurier Nancy, MD      . sodium chloride flush (NS) 0.9 % injection 3 mL  3 mL Intravenous PRN Laurier Nancy, MD      . torsemide [facility]) tablet 100 mg  100 mg Oral Daily Arnaldo Natal, MD   100 mg at 09/13/15 2440     Discharge Medications: Please see discharge summary for a list of discharge medications.  Relevant Imaging Results:  Relevant Lab Results:   Additional Information  (SSN [id number])  Kimberly Ellen Suriyah Vergara, LCSW

## 2015-09-13 NOTE — ED Notes (Signed)
Pt is breathing, pt EKG respiratory lead keeps coming off. Lead is reattached with new EKG sticker.

## 2015-09-13 NOTE — H&P (Signed)
Kimberly Montgomery is an 50 y.o. female.   Chief Complaint: Chest pain HPI: The patient with past medical history significant for end-stage renal disease, CAD and diabetes mellitus presents to the emergency department complaining of chest pain. She states the pain began a few hours after her dialysis treatment this morning. She took 2 nitroglycerin tablets which relieved her pain briefly. The pain returned approximately 5 hours later. She describes it as a sensation summary sitting on her chest. She had associated shortness of breath but denies nausea, vomiting or diaphoresis. She states the pain is similar to that she's had before but differs in that it was substernal and not under her left breast. Troponin was found to be mildly elevated which prompted emergency department staff to call for admission.  Past Medical History  Diagnosis Date  . Renal disorder   . Hypertension   . CHF (congestive heart failure) ([place])   . Diabetes mellitus without complication ([place])   . Gastroparesis   . Asthma   . ESRD (end stage renal disease) ([place])   . Endometriosis   . CAD (coronary artery disease)   . HLD (hyperlipidemia)   . GERD (gastroesophageal reflux disease)     Past Surgical History  Procedure Laterality Date  . Hysterotomy    . Below knee leg amputation    . Pd tube inserstion    . Hd fistula surgery with reversal    . Cardiac catheterization Right 07/10/2015    Procedure: Left Heart Cath and Coronary Angiography;  Surgeon: Dionisio David, MD;  Location: [facility];  Service: Cardiovascular;  Laterality: Right;  . Peripheral vascular catheterization N/A 08/30/2015    Procedure: Dialysis/Perma Catheter Insertion;  Surgeon: Algernon Huxley, MD;  Location: [facility];  Service: Cardiovascular;  Laterality: N/A;    Family History  Problem Relation Age of Onset  . CAD    . Diabetes    . Bipolar disorder    . Cervical cancer Mother    Social History:  reports that she has been  smoking Cigarettes.  She has been smoking about 0.50 packs per day. She does not have any smokeless tobacco history on file. She reports that she does not drink alcohol or use illicit drugs.  Allergies:  Allergies  Allergen Reactions  . Cephalosporins Anaphylaxis  . Penicillins Anaphylaxis and Other (See Comments)    Has patient had a PCN reaction causing immediate rash, facial/tongue/throat swelling, SOB or lightheadedness with hypotension: Yes Has patient had a PCN reaction causing severe rash involving mucus membranes or skin necrosis: No Has patient had a PCN reaction that required hospitalization No Has patient had a PCN reaction occurring within the last 10 years: No If all of the above answers are "NO", then may proceed with Cephalosporin use.  . Lamictal [Lamotrigine] Other (See Comments)    Reaction:  Hallucinations  . Phenergan [Promethazine Hcl] Nausea And Vomiting  . Pravastatin Other (See Comments)    Reaction:  Muscle pain   . Sulfa Antibiotics Other (See Comments)    Reaction:  Unknown     Prior to Admission medications   Medication Sig Start Date End Date Taking? Authorizing Provider  acetaminophen (TYLENOL) 325 MG tablet Take 650 mg by mouth every 6 (six) hours as needed for mild pain.    Yes Historical Provider, MD  aspirin EC 81 MG tablet Take 81 mg by mouth daily.   Yes Historical Provider, MD  bisacodyl (DULCOLAX) 10 MG suppository Place 10  mg rectally daily as needed for moderate constipation. Use if no relief from Milk of Magnesia   Yes Historical Provider, MD  calcitRIOL (ROCALTROL) 0.25 MCG capsule Take 0.25 mcg by mouth daily.   Yes Historical Provider, MD  calcium acetate (PHOSLO) 667 MG capsule Take 3 capsules (2,001 mg total) by mouth 3 (three) times daily with meals. 07/22/15  Yes Enedina Finner, MD  calcium carbonate (TUMS - DOSED IN MG ELEMENTAL CALCIUM) 500 MG chewable tablet Chew 1 tablet (200 mg of elemental calcium total) by mouth 3 (three) times daily with  meals. 08/02/15  Yes Adrian Saran, MD  clopidogrel (PLAVIX) 75 MG tablet Take 75 mg by mouth daily.   Yes Historical Provider, MD  divalproex (DEPAKOTE) 500 MG DR tablet Take 1,000 mg by mouth 3 (three) times daily.   Yes Historical Provider, MD  docusate sodium (COLACE) 100 MG capsule Take 100 mg by mouth at bedtime.    Yes Historical Provider, MD  doxycycline (VIBRA-TABS) 100 MG tablet Take 100 mg by mouth 2 (two) times daily. 09/06/15 09/21/15 Yes Historical Provider, MD  erythromycin (E-MYCIN) 250 MG tablet Take 250 mg by mouth 4 (four) times daily.   Yes Historical Provider, MD  hydrocortisone (ANUSOL-HC) 25 MG suppository Place 25 mg rectally daily. 09/08/15 09/18/15 Yes Historical Provider, MD  insulin aspart (NOVOLOG) 100 UNIT/ML injection Inject 0-9 Units into the skin 3 (three) times daily with meals. 15 units three times a day with meals, 5 units with snacks   Yes Historical Provider, MD  insulin glargine (LANTUS) 100 UNIT/ML injection Inject 0.08 mLs (8 Units total) into the skin at bedtime. 08/30/15  Yes Srikar Sudini, MD  isosorbide mononitrate (IMDUR) 60 MG 24 hr tablet Take 1 tablet (60 mg total) by mouth daily. 07/11/15  Yes Auburn Bilberry, MD  lactulose (CHRONULAC) 10 GM/15ML solution Take 10 g by mouth daily as needed for mild constipation.   Yes Historical Provider, MD  levETIRAcetam (KEPPRA) 500 MG tablet Take 1 tablet (500 mg total) by mouth every morning. 08/02/15  Yes Sital Mody, MD  lisinopril (PRINIVIL,ZESTRIL) 40 MG tablet Take 40 mg by mouth daily. 06/26/15  Yes Historical Provider, MD  metoCLOPramide (REGLAN) 10 MG tablet Take 1 tablet (10 mg total) by mouth 4 (four) times daily. 08/02/15  Yes Adrian Saran, MD  metolazone (ZAROXOLYN) 5 MG tablet Take 5 mg by mouth daily as needed (fluid).   Yes Historical Provider, MD  nicotine (NICODERM CQ - DOSED IN MG/24 HOURS) 14 mg/24hr patch Place 14 mg onto the skin daily.   Yes Historical Provider, MD  nitroGLYCERIN (NITROSTAT) 0.4 MG SL tablet  Place 0.4 mg under the tongue every 5 (five) minutes as needed for chest pain. Reported on 07/10/2015   Yes Historical Provider, MD  nystatin (MYCOSTATIN) powder Apply 1 g topically 2 (two) times daily.   Yes Historical Provider, MD  omeprazole (PRILOSEC) 20 MG capsule Take 40 mg by mouth 2 (two) times daily before a meal.   Yes Historical Provider, MD  ondansetron (ZOFRAN ODT) 4 MG disintegrating tablet Take 1 tablet (4 mg total) by mouth every 6 (six) hours as needed for nausea or vomiting. 07/16/15  Yes Sharyn Creamer, MD  oxyCODONE (OXY IR/ROXICODONE) 5 MG immediate release tablet Take 1 tablet (5 mg total) by mouth every 6 (six) hours as needed for severe pain. 08/30/15  Yes Srikar Sudini, MD  polyethylene glycol (MIRALAX / GLYCOLAX) packet Take 17 g by mouth daily. Patient taking differently: Take 17 g  by mouth 2 (two) times daily.  08/02/15  Yes Bettey Costa, MD  ranolazine (RANEXA) 500 MG 12 hr tablet Take 1 tablet (500 mg total) by mouth 2 (two) times daily. 07/11/15  Yes Dustin Flock, MD  risperiDONE (RISPERDAL) 1 MG tablet Take 1 mg by mouth 2 (two) times daily.    Yes Historical Provider, MD  senna (SENOKOT) 8.6 MG TABS tablet Take 2 tablets by mouth at bedtime as needed for mild constipation.   Yes Historical Provider, MD  senna (SENOKOT) 8.6 MG TABS tablet Take 2 tablets by mouth 2 (two) times daily.   Yes Historical Provider, MD  torsemide (DEMADEX) 100 MG tablet Take 100 mg by mouth daily.   Yes Historical Provider, MD     Results for orders placed or performed during the hospital encounter of 09/12/15 (from the past 48 hour(s))  Basic metabolic panel     Status: Abnormal   Collection Time: 09/12/15  9:17 PM  Result Value Ref Range   Sodium 138 135 - 145 mmol/L   Potassium 3.4 (L) 3.5 - 5.1 mmol/L   Chloride 98 (L) 101 - 111 mmol/L   CO2 31 22 - 32 mmol/L   Glucose, Bld 210 (H) 65 - 99 mg/dL   BUN 26 (H) 6 - 20 mg/dL   Creatinine, Ser 3.83 (H) 0.44 - 1.00 mg/dL   Calcium 9.3 8.9 -  10.3 mg/dL   GFR calc non Af Amer 13 (L) >60 mL/min   GFR calc Af Amer 15 (L) >60 mL/min    Comment: (NOTE) The eGFR has been calculated using the CKD EPI equation. This calculation has not been validated in all clinical situations. eGFR's persistently <60 mL/min signify possible Chronic Kidney Disease.    Anion gap 9 5 - 15  CBC     Status: Abnormal   Collection Time: 09/12/15  9:17 PM  Result Value Ref Range   WBC 6.9 3.6 - 11.0 K/uL   RBC 3.59 (L) 3.80 - 5.20 MIL/uL   Hemoglobin 10.1 (L) 12.0 - 16.0 g/dL   HCT 30.5 (L) 35.0 - 47.0 %   MCV 84.8 80.0 - 100.0 fL   MCH 28.1 26.0 - 34.0 pg   MCHC 33.1 32.0 - 36.0 g/dL   RDW 14.0 11.5 - 14.5 %   Platelets 328 150 - 440 K/uL  Troponin I     Status: Abnormal   Collection Time: 09/12/15  9:17 PM  Result Value Ref Range   Troponin I 0.06 (H) <0.031 ng/mL    Comment: READ BACK AND VERIFIED WITH KATE BUMGARNER AT 2150 09/12/15 MLZ        PERSISTENTLY INCREASED TROPONIN VALUES IN THE RANGE OF 0.04-0.49 ng/mL CAN BE SEEN IN:       -UNSTABLE ANGINA       -CONGESTIVE HEART FAILURE       -MYOCARDITIS       -CHEST TRAUMA       -ARRYHTHMIAS       -LATE PRESENTING MYOCARDIAL INFARCTION       -COPD   CLINICAL FOLLOW-UP RECOMMENDED.    Dg Chest 2 View  09/12/2015  CLINICAL DATA:  Chest pain beginning at 11 a.m. this morning. EXAM: CHEST  2 VIEW COMPARISON:  PA and lateral scratch the single view of the chest 08/23/2015. PA and lateral chest 07/09/2015. FINDINGS: A new right IJ approach dialysis catheter is in place with the tip in the lower superior vena cava. Heart size is upper normal with vascular  congestion identified. No consolidative process, pneumothorax or effusion. No focal bony abnormality. IMPRESSION: No acute disease. Electronically Signed   By: Inge Rise M.D.   On: 09/12/2015 21:44    Review of Systems  Constitutional: Negative for fever and chills.  HENT: Negative for sore throat and tinnitus.   Eyes: Negative for  blurred vision and redness.  Respiratory: Positive for shortness of breath. Negative for cough.   Cardiovascular: Positive for chest pain. Negative for palpitations, orthopnea and PND.  Gastrointestinal: Negative for nausea, vomiting, abdominal pain and diarrhea.  Genitourinary: Negative for dysuria, urgency and frequency.  Musculoskeletal: Negative for myalgias and joint pain.  Skin: Negative for rash.       No lesions  Neurological: Negative for speech change, focal weakness and weakness.  Endo/Heme/Allergies: Does not bruise/bleed easily.       No temperature intolerance  Psychiatric/Behavioral: Negative for depression and suicidal ideas.    Blood pressure 160/74, pulse 92, temperature 98.2 F (36.8 C), temperature source Oral, resp. rate 6, weight 120.203 kg (265 lb), last menstrual period 03/16/2015, SpO2 93 %. Physical Exam  Nursing note and vitals reviewed. Constitutional: She is oriented to person, place, and time. She appears well-developed and well-nourished. No distress.  HENT:  Head: Normocephalic and atraumatic.  Mouth/Throat: Oropharynx is clear and moist.  Eyes: Conjunctivae and EOM are normal. Pupils are equal, round, and reactive to light. No scleral icterus.  Neck: Normal range of motion. Neck supple. No JVD present. No tracheal deviation present. No thyromegaly present.  Cardiovascular: Normal rate, regular rhythm and normal heart sounds.  Exam reveals no gallop and no friction rub.   No murmur heard. Respiratory: Effort normal and breath sounds normal.  PermCath right upper chest  GI: Soft. Bowel sounds are normal. She exhibits no distension. There is no tenderness.  Peritoneal catheter RLQ clean and dressed  Musculoskeletal: Normal range of motion. She exhibits edema.  Right BKA  Lymphadenopathy:    She has no cervical adenopathy.  Neurological: She is alert and oriented to person, place, and time. No cranial nerve deficit. She exhibits normal muscle tone.   Skin: Skin is warm and dry. No rash noted. No erythema.  Psychiatric: She has a normal mood and affect. Her behavior is normal. Judgment and thought content normal.     Assessment/Plan This is a 50 year old female admitted for chest pain. 1. Chest pain: The patient is a long history of unstable angina. We will manage her pain with nitroglycerin and morphine. Continue cycle cardiac enzymes. Sublingual nitroglycerin as needed. Cardiology consult discretion of the primary care team. 2. CAD: Essentially unchanged however, the patient continues to have unstable angina. Continue aspirin, Plavix, Ranexa and Imdur. 3. Essential hypertension: Continue lisinopril and hydralazine 4. 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 Continue Reglan and erythromycin as well as laxatives for gastroparesis. 5. Seizure disorder: Continue Keppra 6. End-stage renal disease: Continue calcitriol, PhosLo, Tums and Demadex. The patient was recently admitted for peritonitis. She has a peritoneal catheter in place but is receiving hemodialysis internal antibiotic course completed. 7. Bipolar disorder: Continue Risperdal and Depakote 8. DVT prophylaxis: Heparin 9. GI prophylaxis: Pantoprazole per home regimen The patient is a full code. Time spent on initial orders and patient care approximately 45 minutes  Harrie Foreman, MD 09/13/2015, 1:44 AM

## 2015-09-13 NOTE — Care Management (Signed)
Patient from [facility]. CSW updated.

## 2015-09-13 NOTE — ED Notes (Signed)
Called pharmacy to get the potassium chloride solution (it is not the same as in the pyxis).

## 2015-09-13 NOTE — Progress Notes (Signed)
*  PRELIMINARY RESULTS* Echocardiogram 2D Echocardiogram has been performed.  Georgann Housekeeper Hege 09/13/2015, 12:44 PM

## 2015-09-13 NOTE — Progress Notes (Signed)
CSW received phone call from patient's daughter. Reported she's not pleased with care at [facility] and would like to identify another facility for patient to go to for SNF. FL2/ PASRR completed and referral faxed to SNFs in [facility]. CSW assessment to follow. CSW will continue to follow and assist.   Woodroe Mode, MSW, LCSW-A Clinical Social Work Department [phone]

## 2015-09-13 NOTE — Progress Notes (Signed)
Spoke with patient about plan for cardiac cath. She is in agreement. She is tearful, but states that this is related to personal issues.

## 2015-09-14 ENCOUNTER — Encounter
Admission: EM | Disposition: A | Discharge status: Skilled Nursing Facility | Payer: Self-pay | Source: Home / Self Care | Attending: Emergency Medicine

## 2015-09-14 DIAGNOSIS — E1122 Type 2 diabetes mellitus with diabetic chronic kidney disease: Secondary | ICD-10-CM | POA: Diagnosis not present

## 2015-09-14 HISTORY — PX: CARDIAC CATHETERIZATION: SHX172

## 2015-09-14 LAB — GLUCOSE, CAPILLARY
Glucose-Capillary: 114 mg/dL — ABNORMAL HIGH (ref 65–99)
Glucose-Capillary: 102 mg/dL — ABNORMAL HIGH (ref 65–99)
Glucose-Capillary: 153 mg/dL — ABNORMAL HIGH (ref 65–99)

## 2015-09-14 LAB — PHOSPHORUS: PHOSPHORUS: 4.2 mg/dL (ref 2.5–4.6)

## 2015-09-14 SURGERY — LEFT HEART CATH AND CORONARY ANGIOGRAPHY
Anesthesia: Moderate Sedation | Laterality: Right

## 2015-09-14 MED ORDER — SODIUM CHLORIDE 0.9 % IV SOLN: 100.0000 mL | INTRAVENOUS | Status: DC | PRN

## 2015-09-14 MED ORDER — MIDAZOLAM HCL 2 MG/2ML IJ SOLN
INTRAMUSCULAR | Status: DC | PRN
  Administered 2015-09-14: 1 mg via INTRAVENOUS

## 2015-09-14 MED ORDER — SODIUM CHLORIDE 0.9 % WEIGHT BASED INFUSION: 1.0000 mL/kg/h | INTRAVENOUS | Status: AC

## 2015-09-14 MED ORDER — HEPARIN (PORCINE) IN NACL 2-0.9 UNIT/ML-% IJ SOLN
INTRAMUSCULAR | Status: AC
  Filled 2015-09-14: qty 1000

## 2015-09-14 MED ORDER — NITROGLYCERIN 5 MG/ML IV SOLN
INTRAVENOUS | Status: AC
  Filled 2015-09-14: qty 10

## 2015-09-14 MED ORDER — SODIUM CHLORIDE 0.9% FLUSH
3.0000 mL | Freq: Two times a day (BID) | INTRAVENOUS | Status: DC
Start: 2015-09-14 — End: 2015-09-15
  Administered 2015-09-14 – 2015-09-15 (×2): 3 mL via INTRAVENOUS

## 2015-09-14 MED ORDER — CLOPIDOGREL BISULFATE 75 MG PO TABS
ORAL_TABLET | ORAL | Status: AC
  Filled 2015-09-14: qty 4

## 2015-09-14 MED ORDER — FENTANYL CITRATE (PF) 100 MCG/2ML IJ SOLN
INTRAMUSCULAR | Status: AC
  Filled 2015-09-14: qty 2

## 2015-09-14 MED ORDER — ASPIRIN 81 MG PO CHEW
CHEWABLE_TABLET | ORAL | Status: DC | PRN
  Administered 2015-09-14: 243 mg via ORAL

## 2015-09-14 MED ORDER — SODIUM CHLORIDE 0.9% FLUSH: 3.0000 mL | INTRAVENOUS | Status: DC | PRN

## 2015-09-14 MED ORDER — FENTANYL CITRATE (PF) 100 MCG/2ML IJ SOLN
INTRAMUSCULAR | Status: DC | PRN
  Administered 2015-09-14: 25 ug via INTRAVENOUS

## 2015-09-14 MED ORDER — CLOPIDOGREL BISULFATE 75 MG PO TABS
ORAL_TABLET | ORAL | Status: DC | PRN
  Administered 2015-09-14: 300 mg via ORAL

## 2015-09-14 MED ORDER — IOPAMIDOL (ISOVUE-300) INJECTION 61%
INTRAVENOUS | Status: DC | PRN
  Administered 2015-09-14: 130 mL via INTRAVENOUS

## 2015-09-14 MED ORDER — LIDOCAINE HCL (PF) 1 % IJ SOLN
5.0000 mL | INTRAMUSCULAR | Status: DC | PRN
  Filled 2015-09-14: qty 5

## 2015-09-14 MED ORDER — ACETAMINOPHEN 325 MG PO TABS: 650.0000 mg | ORAL_TABLET | ORAL | Status: DC | PRN

## 2015-09-14 MED ORDER — LABETALOL HCL 5 MG/ML IV SOLN
INTRAVENOUS | Status: AC
  Filled 2015-09-14: qty 4

## 2015-09-14 MED ORDER — MIDAZOLAM HCL 2 MG/2ML IJ SOLN
INTRAMUSCULAR | Status: AC
  Filled 2015-09-14: qty 2

## 2015-09-14 MED ORDER — SODIUM CHLORIDE 0.9 % IV SOLN
250.0000 mg | INTRAVENOUS | Status: DC | PRN
  Administered 2015-09-14: 0.25 mg/kg/h via INTRAVENOUS

## 2015-09-14 MED ORDER — MIDAZOLAM HCL 2 MG/2ML IJ SOLN
INTRAMUSCULAR | Status: DC | PRN
  Administered 2015-09-14: 0.5 mg via INTRAVENOUS

## 2015-09-14 MED ORDER — LIDOCAINE-PRILOCAINE 2.5-2.5 % EX CREA
1.0000 "application " | TOPICAL_CREAM | CUTANEOUS | Status: DC | PRN
  Filled 2015-09-14: qty 5

## 2015-09-14 MED ORDER — ALTEPLASE 2 MG IJ SOLR: 2.0000 mg | Freq: Once | INTRAMUSCULAR | Status: DC | PRN

## 2015-09-14 MED ORDER — HEPARIN SODIUM (PORCINE) 1000 UNIT/ML DIALYSIS
1000.0000 [IU] | INTRAMUSCULAR | Status: DC | PRN
  Filled 2015-09-14: qty 1

## 2015-09-14 MED ORDER — ONDANSETRON HCL 4 MG/2ML IJ SOLN: 4.0000 mg | Freq: Four times a day (QID) | INTRAMUSCULAR | Status: DC | PRN

## 2015-09-14 MED ORDER — BIVALIRUDIN BOLUS VIA INFUSION - CUPID
INTRAVENOUS | Status: DC | PRN
  Administered 2015-09-14: 89.4 mg via INTRAVENOUS

## 2015-09-14 MED ORDER — ASPIRIN EC 325 MG PO TBEC
325.0000 mg | DELAYED_RELEASE_TABLET | Freq: Every day | ORAL | Status: DC
  Administered 2015-09-15: 325 mg via ORAL
  Filled 2015-09-14: qty 1

## 2015-09-14 MED ORDER — EPOETIN ALFA 10000 UNIT/ML IJ SOLN: 4000.0000 [IU] | INTRAMUSCULAR | Status: DC

## 2015-09-14 MED ORDER — BIVALIRUDIN 250 MG IV SOLR
INTRAVENOUS | Status: AC
  Filled 2015-09-14: qty 250

## 2015-09-14 MED ORDER — SODIUM CHLORIDE 0.9 % IV SOLN
100.0000 mL | INTRAVENOUS | Status: DC | PRN
Start: 2015-09-14 — End: 2015-09-15

## 2015-09-14 MED ORDER — AMLODIPINE BESYLATE 5 MG PO TABS
ORAL_TABLET | ORAL | Status: AC
  Administered 2015-09-14: 13:00:00
  Filled 2015-09-14: qty 2

## 2015-09-14 MED ORDER — PENTAFLUOROPROP-TETRAFLUOROETH EX AERO
1.0000 "application " | INHALATION_SPRAY | CUTANEOUS | Status: DC | PRN
  Filled 2015-09-14: qty 30

## 2015-09-14 MED ORDER — HYDRALAZINE HCL 20 MG/ML IJ SOLN
INTRAMUSCULAR | Status: AC
  Filled 2015-09-14: qty 1

## 2015-09-14 MED ORDER — CLOPIDOGREL BISULFATE 75 MG PO TABS
75.0000 mg | ORAL_TABLET | Freq: Every day | ORAL | Status: DC
  Administered 2015-09-15: 75 mg via ORAL
  Filled 2015-09-14: qty 1

## 2015-09-14 MED ORDER — ASPIRIN 81 MG PO CHEW
CHEWABLE_TABLET | ORAL | Status: AC
  Filled 2015-09-14: qty 3

## 2015-09-14 MED ORDER — LABETALOL HCL 5 MG/ML IV SOLN
INTRAVENOUS | Status: DC | PRN
  Administered 2015-09-14 (×2): 10 mg via INTRAVENOUS

## 2015-09-14 MED ORDER — FENTANYL CITRATE (PF) 100 MCG/2ML IJ SOLN
INTRAMUSCULAR | Status: DC | PRN
  Administered 2015-09-14: 50 ug via INTRAVENOUS

## 2015-09-14 MED ORDER — IOPAMIDOL (ISOVUE-300) INJECTION 61%
INTRAVENOUS | Status: DC | PRN
  Administered 2015-09-14: 200 mL via INTRAVENOUS

## 2015-09-14 MED ORDER — SODIUM CHLORIDE 0.9 % IV SOLN: 250.0000 mL | INTRAVENOUS | Status: DC | PRN

## 2015-09-14 SURGICAL SUPPLY — 16 items
BALLN TREK RX 2.5X15 (BALLOONS) ×4
BALLOON TREK RX 2.5X15 (BALLOONS) ×2 IMPLANT
CATH INFINITI 5FR ANG PIGTAIL (CATHETERS) ×4 IMPLANT
CATH INFINITI 5FR JL4 (CATHETERS) ×4 IMPLANT
CATH INFINITI JR4 5F (CATHETERS) ×4 IMPLANT
CATH VISTA GUIDE 6FR XB3.5 SH (CATHETERS) ×4 IMPLANT
DEVICE CLOSURE MYNXGRIP 6/7F (Vascular Products) ×4 IMPLANT
DEVICE INFLAT 30 PLUS (MISCELLANEOUS) ×4 IMPLANT
KIT MANI 3VAL PERCEP (MISCELLANEOUS) ×4 IMPLANT
NEEDLE PERC 18GX7CM (NEEDLE) ×4 IMPLANT
PACK CARDIAC CATH (CUSTOM PROCEDURE TRAY) ×4 IMPLANT
SHEATH AVANTI 5FR X 11CM (SHEATH) ×4 IMPLANT
SHEATH AVANTI 6FR X 11CM (SHEATH) ×4 IMPLANT
STENT XIENCE ALPINE RX 2.75X15 (Permanent Stent) ×4 IMPLANT
WIRE EMERALD 3MM-J .035X150CM (WIRE) ×4 IMPLANT
WIRE G HI TQ BMW 190 (WIRE) ×4 IMPLANT

## 2015-09-14 NOTE — Care Management Note (Signed)
Patient is active at [facility] schedule.  I will update clinic with additional records at discharge.  Ivor Reining  Dialysis  Coordinator  [phone]

## 2015-09-14 NOTE — Progress Notes (Signed)
Hemodialysis start 

## 2015-09-14 NOTE — Progress Notes (Signed)
Pt went for cath and a stent was placed in LAD. Groin Meanc closed no bleeding or hematoma. Pt reported pain and received pain meds. Room air. NSR. Takes meds ok. Scheduled for HD this evening. Pt has no further concerns at this time.

## 2015-09-14 NOTE — Progress Notes (Signed)
  SUBJECTIVE: Patient is sleeping upon entering the room. She has gently aroused and states that she has had a good night and currently not having any chest pain.   Filed Vitals:   09/13/15 1644 09/13/15 1808 09/13/15 2008 09/14/15 0530  BP:   173/66 139/65  Pulse: 74  81 62  Temp:   98.3 F (36.8 C) 98.3 F (36.8 C)  TempSrc:   Oral Oral  Resp:   18 20  Height:      Weight:  262 lb 14.4 oz (119.251 kg)  262 lb 12.8 oz (119.205 kg)  SpO2: 93%  94% 92%    Intake/Output Summary (Last 24 hours) at 09/14/15 0800 Last data filed at 09/13/15 1847  Gross per 24 hour  Intake    600 ml  Output      0 ml  Net    600 ml    LABS: Basic Metabolic Panel:  Recent Labs  09/73/53 2117  NA 138  K 3.4*  CL 98*  CO2 31  GLUCOSE 210*  BUN 26*  CREATININE 3.83*  CALCIUM 9.3   Liver Function Tests: No results for input(s): AST, ALT, ALKPHOS, BILITOT, PROT, ALBUMIN in the last 72 hours. No results for input(s): LIPASE, AMYLASE in the last 72 hours. CBC:  Recent Labs  09/12/15 2117  WBC 6.9  HGB 10.1*  HCT 30.5*  MCV 84.8  PLT 328   Cardiac Enzymes:  Recent Labs  09/13/15 0227 09/13/15 0826 09/13/15 1423  TROPONINI 0.08* 0.08* 0.07*   BNP: Invalid input(s): POCBNP D-Dimer: No results for input(s): DDIMER in the last 72 hours. Hemoglobin A1C:  Recent Labs  09/13/15 0227  HGBA1C 6.2*   Fasting Lipid Panel: No results for input(s): CHOL, HDL, LDLCALC, TRIG, CHOLHDL, LDLDIRECT in the last 72 hours. Thyroid Function Tests:  Recent Labs  09/13/15 0227  TSH 9.502*   Anemia Panel: No results for input(s): VITAMINB12, FOLATE, FERRITIN, TIBC, IRON, RETICCTPCT in the last 72 hours.   PHYSICAL EXAM General: Well developed, well nourished, in no acute distress HEENT:  Normocephalic and atramatic Neck:  No JVD.  Lungs: Clear bilaterally to auscultation and percussion. Heart: HRRR . Normal S1 and S2 without gallops or murmurs.  Abdomen: Bowel sounds are positive,  abdomen soft and non-tender  Msk:  Back normal, normal gait. Normal strength and tone for age. Extremities: No clubbing, cyanosis or edema.  Right above-the-knee amputation Neuro: Alert and oriented X 3. Psych:  Good affect, responds appropriately  TELEMETRY: Normal sinus rhythm at 67 bpm  ASSESSMENT AND PLAN: Unstable angina with mildly elevated troponins with a flat trend. Patient with known two-vessel CAD, failed maximal medical therapy. She was scheduled for cardiac catheterization this morning with Dr. Welton Flakes. Her echocardiogram done yesterday shows a normal left ventricular systolic function with EF 65%, normal wall motion, grade 1 diastolic dysfunction. Nephrology is seeing the patient and she will have dialysis after her catheterization.  Active Problems:   Chest pain    Berton Bon, NP 09/14/2015 8:00 AM

## 2015-09-14 NOTE — Progress Notes (Signed)
Pre-hd tx 

## 2015-09-14 NOTE — Progress Notes (Signed)
[facility]  ROUNDING NOTE   Subjective:  Patient status post cardiac catheterization with stent placement today. She is currently chest pain-free. She is due for dialysis later today.    Objective:  Vital signs in last 24 hours:  Temp:  [97.6 F (36.4 C)-98.4 F (36.9 C)] 97.6 F (36.4 C) (06/15 1528) Pulse Rate:  [62-84] 76 (06/15 1528) Resp:  [12-20] 18 (06/15 1528) BP: (114-174)/(50-75) 129/50 mmHg (06/15 1528) SpO2:  [92 %-100 %] 93 % (06/15 1528) Weight:  [119.205 kg (262 lb 12.8 oz)-119.251 kg (262 lb 14.4 oz)] 119.205 kg (262 lb 12.8 oz) (06/15 0530)  Weight change: -0.953 kg (-2 lb 1.6 oz) Filed Weights   09/13/15 0518 09/13/15 1808 09/14/15 0530  Weight: 118.616 kg (261 lb 8 oz) 119.251 kg (262 lb 14.4 oz) 119.205 kg (262 lb 12.8 oz)    Intake/Output: I/O last 3 completed shifts: In: 600 [P.O.:600] Out: 0    Intake/Output this shift:     Physical Exam: General: NAD, resting in bed  Head: Normocephalic, atraumatic. Moist oral mucosal membranes  Eyes: Anicteric  Neck: Supple, trachea midline  Lungs:  Clear to auscultation  Heart: S1S2 no rubs  Abdomen:  Soft, nontender, BS present   Extremities: Trace LLE edema, RLE amputation.  Neurologic: Nonfocal, moving all four extremities  Skin: No lesions  Access: R IJ permcath    Basic Metabolic Panel:  Recent Labs Lab 09/12/15 2117  NA 138  K 3.4*  CL 98*  CO2 31  GLUCOSE 210*  BUN 26*  CREATININE 3.83*  CALCIUM 9.3    Liver Function Tests: No results for input(s): AST, ALT, ALKPHOS, BILITOT, PROT, ALBUMIN in the last 168 hours. No results for input(s): LIPASE, AMYLASE in the last 168 hours. No results for input(s): AMMONIA in the last 168 hours.  CBC:  Recent Labs Lab 09/12/15 2117  WBC 6.9  HGB 10.1*  HCT 30.5*  MCV 84.8  PLT 328    Cardiac Enzymes:  Recent Labs Lab 09/12/15 2117 09/13/15 0227 09/13/15 0826 09/13/15 1423  TROPONINI 0.06* 0.08* 0.08* 0.07*     BNP: Invalid input(s): POCBNP  CBG:  Recent Labs Lab 09/13/15 0718 09/13/15 1128 09/13/15 1705 09/13/15 2048 09/14/15 0723  GLUCAP 166* 155* 170* 163* 114*    Microbiology: Results for orders placed or performed during the hospital encounter of 09/12/15  MRSA PCR Screening     Status: None   Collection Time: 09/13/15  6:08 AM  Result Value Ref Range Status   MRSA by PCR NEGATIVE NEGATIVE Final    Comment:        The GeneXpert MRSA Assay (FDA approved for NASAL specimens only), is one component of a comprehensive MRSA colonization surveillance program. It is not intended to diagnose MRSA infection nor to guide or monitor treatment for MRSA infections.     Coagulation Studies: No results for input(s): LABPROT, INR in the last 72 hours.  Urinalysis: No results for input(s): COLORURINE, LABSPEC, PHURINE, GLUCOSEU, HGBUR, BILIRUBINUR, KETONESUR, PROTEINUR, UROBILINOGEN, NITRITE, LEUKOCYTESUR in the last 72 hours.  Invalid input(s): APPERANCEUR    Imaging: Dg Chest 2 View  09/12/2015  CLINICAL DATA:  Chest pain beginning at 11 a.m. this morning. EXAM: CHEST  2 VIEW COMPARISON:  PA and lateral scratch the single view of the chest 08/23/2015. PA and lateral chest 07/09/2015. FINDINGS: A new right IJ approach dialysis catheter is in place with the tip in the lower superior vena cava. Heart size is upper normal with  vascular congestion identified. No consolidative process, pneumothorax or effusion. No focal bony abnormality. IMPRESSION: No acute disease. Electronically Signed   By: Drusilla Kanner M.D.   On: 09/12/2015 21:44     Medications:   . sodium chloride     . amLODipine      . aspirin EC  325 mg Oral Daily  . aspirin EC  81 mg Oral Daily  . calcitRIOL  0.25 mcg Oral Daily  . calcium acetate  2,001 mg Oral TID WC  . calcium carbonate  1 tablet Oral TID WC  . clopidogrel  75 mg Oral Daily  . [START ON 09/15/2015] clopidogrel  75 mg Oral Q breakfast  .  divalproex  1,000 mg Oral TID  . docusate sodium  100 mg Oral QHS  . doxycycline  100 mg Oral BID  . erythromycin  250 mg Oral QID  . heparin  5,000 Units Subcutaneous Q8H  . hydrocortisone  25 mg Rectal Daily  . insulin aspart  0-9 Units Subcutaneous TID WC  . insulin glargine  5 Units Subcutaneous QHS  . isosorbide mononitrate  60 mg Oral Daily  . levETIRAcetam  500 mg Oral q morning - 10a  . lisinopril  40 mg Oral Daily  . metoCLOPramide  10 mg Oral QID  . metoprolol tartrate  50 mg Oral BID  . nicotine  14 mg Transdermal Daily  . nystatin  1 g Topical BID  . pantoprazole  80 mg Oral Daily  . polyethylene glycol  17 g Oral BID  . ranolazine  500 mg Oral BID  . risperiDONE  1 mg Oral BID  . senna  2 tablet Oral BID  . sodium chloride flush  3 mL Intravenous Q12H  . sodium chloride flush  3 mL Intravenous Q12H  . torsemide  100 mg Oral Daily   sodium chloride, sodium chloride, sodium chloride, acetaminophen, acetaminophen, albuterol, alteplase, bisacodyl, guaiFENesin-dextromethorphan, heparin, lactulose, lidocaine (PF), lidocaine-prilocaine, metolazone, nitroGLYCERIN, ondansetron **OR** ondansetron (ZOFRAN) IV, ondansetron (ZOFRAN) IV, ondansetron, oxyCODONE, pentafluoroprop-tetrafluoroeth, senna, sodium chloride flush  Assessment/ Plan:  50 y.o. female  with past medical history of end-stage renal disease on peritoneal dialysis, asthma, gastroparesis, diabetes mellitus, congestive heart failure, hypertension, endometriosis, coronary atherosclerosis/medical management, PD peritonitis with micrococcus treated with meropenem and IV/IP vancomycin  [facility]/[address]/peritoneal dialysis  1. 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  2. Anemia of CKD:  - restart Epogen 4000 units IV with dialysis.[name]  3. Hypertension:  - currently on lisinopril, blood pressurecurrently well-controlled at  129/50.  4. Secondary Hyperparathyroidism: check intact PTH and phosphorus with dialysis today. Continue calcium acetate at this time.  5.  Chest pain. Patient status post stent placement in the mid LAD.  LOS:  Jillian Pianka 6/15/20173:30 PM

## 2015-09-14 NOTE — OR Nursing (Signed)
Despite multiple attempts unable to get second IV, cath lab to notify md

## 2015-09-14 NOTE — Clinical Social Work Note (Signed)
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  Yes Need for family participation in patient care:  Yes (Comment) Nira Conn - Daughter )  Care giving concerns: Patient is from [facility].    Social Worker assessment / plan:  CSW met with patient at bedside. CSW introduced herself and her role. Per patient she is from [facility] she reproted that she does not want to return. Requested CSW send a SNF referrals to [facility]. FL2/ PASRR completed and faxed to SNFs in [place]. Awaiting bed offers. CSW was granted verbal permission to speak to patient's daughter. CSW spoke to patient's daughter. Per her daughter she reported that [facility] was not taking care of her mother. Stated that her mother did not receive a bath and did not get her medications. She stated that she will discuss options with her mother after bed offers are presented. CSW will continue to follow and assist.   Employment status:  Retired Nurse, adult PT Recommendations:  Not assessed at this time Information / Referral to community resources:  Other (Comment Required)  Patient/Family's Response to care:  Patient and her family  are in agreement with SNF placement and would like to look into other SNF options.   Patient/Family's Understanding of and Emotional Response to Diagnosis, Current Treatment, and Prognosis:  Patient and her family are appreciative of CSW assistance.   Emotional Assessment Appearance:  Appears stated age Attitude/Demeanor/Rapport:   (None) Affect (typically observed):  Calm, Pleasant Orientation:  Oriented to Self, Oriented to Place, Oriented to  Time, Oriented to Situation Alcohol / Substance use:  Not Applicable Psych involvement (Current and /or in the community):  No (Comment)  Discharge Needs  Concerns to be addressed:  Discharge Planning Concerns Readmission within the last 30 days:  No Current discharge risk:  Chronically ill Barriers to Discharge:  Continued Medical Work up   [employer], LCSW 09/14/2015, 1:39 PM

## 2015-09-15 DIAGNOSIS — E1122 Type 2 diabetes mellitus with diabetic chronic kidney disease: Secondary | ICD-10-CM | POA: Diagnosis not present

## 2015-09-15 LAB — GLUCOSE, CAPILLARY
Glucose-Capillary: 149 mg/dL — ABNORMAL HIGH (ref 65–99)
Glucose-Capillary: 139 mg/dL — ABNORMAL HIGH (ref 65–99)
Glucose-Capillary: 148 mg/dL — ABNORMAL HIGH (ref 65–99)

## 2015-09-15 LAB — PARATHYROID HORMONE, INTACT (NO CA): PTH: 19 pg/mL (ref 15–65)

## 2015-09-15 MED ORDER — OXYCODONE HCL 5 MG PO TABS: 5.0000 mg | ORAL_TABLET | Freq: Four times a day (QID) | ORAL | Status: DC | PRN

## 2015-09-15 MED ORDER — METOPROLOL TARTRATE 50 MG PO TABS
50.0000 mg | ORAL_TABLET | Freq: Two times a day (BID) | ORAL | Status: DC
Start: 2015-09-15 — End: 2018-04-07

## 2015-09-15 NOTE — Progress Notes (Signed)
Post dialysis 

## 2015-09-15 NOTE — Progress Notes (Signed)
Tx complete  

## 2015-09-15 NOTE — Progress Notes (Signed)
[facility] - [place] at [facility]   PATIENT NAME: Kimberly Montgomery    MR#:  [id number]  DATE OF BIRTH:  24-Apr-1965  SUBJECTIVE:  CHIEF COMPLAINT:   Chief Complaint  Patient presents with  . Chest Pain   NO chest pain today. Cath - DES in Mid LAD  REVIEW OF SYSTEMS:    Review of Systems  Constitutional: Positive for malaise/fatigue. Negative for fever and chills.  HENT: Negative for sore throat.   Eyes: Negative for blurred vision, double vision and pain.  Respiratory: Negative for cough, hemoptysis, shortness of breath and wheezing.   Cardiovascular: Negative for chest pain, palpitations, orthopnea and leg swelling.  Gastrointestinal: Negative for heartburn, nausea, vomiting, abdominal pain, diarrhea and constipation.  Genitourinary: Negative for dysuria and hematuria.  Musculoskeletal: Positive for back pain. Negative for joint pain.  Skin: Negative for rash.  Neurological: Negative for sensory change, speech change, focal weakness and headaches.  Endo/Heme/Allergies: Does not bruise/bleed easily.  Psychiatric/Behavioral: Negative for depression. The patient is not nervous/anxious.     DRUG ALLERGIES:   Allergies  Allergen Reactions  . Cephalosporins Anaphylaxis  . Penicillins Anaphylaxis and Other (See Comments)    Has patient had a PCN reaction causing immediate rash, facial/tongue/throat swelling, SOB or lightheadedness with hypotension: Yes Has patient had a PCN reaction causing severe rash involving mucus membranes or skin necrosis: No Has patient had a PCN reaction that required hospitalization No Has patient had a PCN reaction occurring within the last 10 years: No If all of the above answers are "NO", then may proceed with Cephalosporin use.  . Lamictal [Lamotrigine] Other (See Comments)    Reaction:  Hallucinations  . Phenergan [Promethazine Hcl] Nausea And Vomiting  . Pravastatin Other (See Comments)    Reaction:  Muscle pain   .  Sulfa Antibiotics Other (See Comments)    Reaction:  Unknown     VITALS:  Blood pressure 130/59, pulse 70, temperature 98.1 F (36.7 C), temperature source Oral, resp. rate 18, height  (1.753 m), weight 118.57 kg (261 lb 6.4 oz), last menstrual period 03/16/2015, SpO2 93 %.  PHYSICAL EXAMINATION:   Physical Exam  GENERAL:  50 y.o.-year-old patient lying in the bed with no acute distress.  EYES: Pupils equal, round, reactive to light and accommodation. No scleral icterus. Extraocular muscles intact.  HEENT: Head atraumatic, normocephalic. Oropharynx and nasopharynx clear.  NECK:  Supple, no jugular venous distention. No thyroid enlargement, no tenderness. Permcath in place LUNGS: Normal breath sounds bilaterally, no wheezing, rales, rhonchi. No use of accessory muscles of respiration.  CARDIOVASCULAR: S1, S2 normal. No murmurs, rubs, or gallops.  ABDOMEN: Soft, nontender, nondistended. Bowel sounds present. No organomegaly or mass. PD catheter EXTREMITIES: No cyanosis, clubbing. Edema present. Right BKA NEUROLOGIC: Cranial nerves II through XII are intact. No focal Motor or sensory deficits b/l.   PSYCHIATRIC: The patient is alert and oriented x 3.  SKIN: No obvious rash, lesion, or ulcer.   LABORATORY PANEL:   CBC  Recent Labs Lab 09/12/15 2117  WBC 6.9  HGB 10.1*  HCT 30.5*  PLT 328   ------------------------------------------------------------------------------------------------------------------ Chemistries   Recent Labs Lab 09/12/15 2117  NA 138  K 3.4*  CL 98*  CO2 31  GLUCOSE 210*  BUN 26*  CREATININE 3.83*  CALCIUM 9.3   ------------------------------------------------------------------------------------------------------------------  Cardiac Enzymes  Recent Labs Lab 09/13/15 1423  TROPONINI 0.07*   ------------------------------------------------------------------------------------------------------------------  RADIOLOGY:  No results  found.   ASSESSMENT AND PLAN:   *  Unstable angina * IDDM * HTN * ESRD * Anemia of chronic   Admitted to telemetry floor. Troponin normal. But due to symptoms Patient had a repeat cardiac catheterization. Had a drug eluting stent placed in mid LAD. Symptoms have improved significantly. Patient is on aspirin, Plavix, statin, beta blocker.  Diabetes medications and blood pressure medications continued in the hospital. Metoprolol added.  HD today after cath.   All the records are reviewed and case discussed with Care Management/Social Workerr. Management plans discussed with the patient, family and they are in agreement.  CODE STATUS: FULL  DVT Prophylaxis: SCDs  TOTAL TIME TAKING CARE OF THIS PATIENT: 30 minutes.   POSSIBLE D/C IN 1-2 DAYS, DEPENDING ON CLINICAL CONDITION.  Milagros Loll R M.D on 09/15/2015 at 8:43 AM  Between 7am to 6pm - Pager - [phone]  After 6pm go to [email or web address] - password [email or web address] [facility]  [facility]  Office  [phone]  CC: Primary care physician; Jarome Matin, MD  Note: This dictation was prepared with Dragon dictation along with smaller phrase technology. Any transcriptional errors that result from this process are unintentional.

## 2015-09-15 NOTE — Progress Notes (Signed)
SUBJECTIVE: Patient is awake and alert this morning, ordering her breakfast. She has had no further chest pain since her cardiac intervention. She has mild shortness of breath but this is much better than prior to receiving the stent.   Filed Vitals:   09/15/15 0246 09/15/15 0430 09/15/15 0432 09/15/15 0721  BP:  179/54 161/44 130/59  Pulse: 95 84 81 70  Temp:  98.1 F (36.7 C)    TempSrc:  Oral    Resp:  18    Height:      Weight:  261 lb 6.4 oz (118.57 kg)    SpO2: 95% 97%  93%    Intake/Output Summary (Last 24 hours) at 09/15/15 0758 Last data filed at 09/15/15 0434  Gross per 24 hour  Intake     93 ml  Output   2000 ml  Net  -1907 ml    LABS: Basic Metabolic Panel:  Recent Labs  83/41/96 2117 09/14/15 1608  NA 138  --   K 3.4*  --   CL 98*  --   CO2 31  --   GLUCOSE 210*  --   BUN 26*  --   CREATININE 3.83*  --   CALCIUM 9.3  --   PHOS  --  4.2   Liver Function Tests: No results for input(s): AST, ALT, ALKPHOS, BILITOT, PROT, ALBUMIN in the last 72 hours. No results for input(s): LIPASE, AMYLASE in the last 72 hours. CBC:  Recent Labs  09/12/15 2117  WBC 6.9  HGB 10.1*  HCT 30.5*  MCV 84.8  PLT 328   Cardiac Enzymes:  Recent Labs  09/13/15 0227 09/13/15 0826 09/13/15 1423  TROPONINI 0.08* 0.08* 0.07*   BNP: Invalid input(s): POCBNP D-Dimer: No results for input(s): DDIMER in the last 72 hours. Hemoglobin A1C:  Recent Labs  09/13/15 0227  HGBA1C 6.2*   Fasting Lipid Panel: No results for input(s): CHOL, HDL, LDLCALC, TRIG, CHOLHDL, LDLDIRECT in the last 72 hours. Thyroid Function Tests:  Recent Labs  09/13/15 0227  TSH 9.502*   Anemia Panel: No results for input(s): VITAMINB12, FOLATE, FERRITIN, TIBC, IRON, RETICCTPCT in the last 72 hours.   PHYSICAL EXAM General: Well developed, well nourished, in no acute distress HEENT:  Normocephalic and atramatic Neck:  No JVD.  Lungs: Clear bilaterally to auscultation and  percussion. Heart: HRRR . Normal S1 and S2 without gallops or murmurs.  Abdomen: Bowel sounds are positive, abdomen soft and non-tender  Msk:  Back normal, normal gait. Normal strength and tone for age. Extremities: No clubbing, cyanosis or edema.  Right below-the-knee amputation. Right groin is soft, nontender, without ecchymosis, bruit or hematoma. Neuro: Alert and oriented X 3. Psych:  Good affect, responds appropriately  TELEMETRY: Sinus rhythm at 64 bpm  ASSESSMENT AND PLAN: The patient was admitted for chest pain shortness of breath that was classic anginal type. Cardiac cath was done yesterday which found 90% stenosis in the mid LAD, 65% stenosis of distal LAD, 50% stenosis of first marginal. A Xience Alpine stent was placed in the mid LAD lesion. The patient is doing well this morning. She was already on Plavix prior to admission. She can be discharged home on all of her previous medicines including Plavix 75 mg and aspirin 81 mg daily.  The patient should follow-up in the office on Monday at 10:00 for groin check and post stent assessment.  Active Problems:   Chest pain    Kennedey Digilio A, MD, [facility] 09/15/2015 7:58 AM  Do

## 2015-09-15 NOTE — Progress Notes (Signed)
[facility] has not received Ethlyn Gallery for patient. CSW requested a 5 day LOG from Wandra Mannan- Scientist, research (physical sciences). LOG granted and faxed to [facility].   Woodroe Mode, MSW, LCSW-A Clinical Social Work Department [phone]

## 2015-09-15 NOTE — Progress Notes (Signed)
A & O. Pt reported pain and received oxy. 2 L of oxygen. NSR. FS are stable. Report called to Shanda Bumps RN at [facility]. IV and tele removed. Discharge instructions given to pt. Pt has no further concerns at this time.

## 2015-09-15 NOTE — Progress Notes (Signed)
Clinical Social Worker informed that patient will be medically ready to discharge to [facility]. Patient is in a agreement with plan. CSW called [place] in Admissions at [facility] to confirm that patient's bed is ready. Provided patient's room number 1B and number to call for report [phone]. All discharge information faxed to [facility] via [facility]. Rx's added to discharge packet.   Call to patient's daughter, left message to inform her patient would discharge to [facility]. RN will call report and patient will discharge to [facility] via [facility].  Woodroe Mode, MSW, LCSW-A Clinical Social Work Department [phone]

## 2015-09-15 NOTE — Progress Notes (Signed)
[facility]  ROUNDING NOTE   Subjective:  Patient doing well post cardiac catheterization. She completed hemodialysis yesterday. She is currently resting in bed.   Objective:  Vital signs in last 24 hours:  Temp:  [97.6 F (36.4 C)-98.6 F (37 C)] 98.4 F (36.9 C) (06/16 1143) Pulse Rate:  [70-95] 75 (06/16 1143) Resp:  [11-23] 18 (06/16 1143) BP: (122-179)/(44-85) 160/57 mmHg (06/16 1143) SpO2:  [92 %-100 %] 98 % (06/16 1143) Weight:  [118.57 kg (261 lb 6.4 oz)-119.205 kg (262 lb 12.8 oz)] 118.57 kg (261 lb 6.4 oz) (06/16 0430)  Weight change: -0.046 kg (-1.6 oz) Filed Weights   09/14/15 0530 09/14/15 2105 09/15/15 0430  Weight: 119.205 kg (262 lb 12.8 oz) 119.205 kg (262 lb 12.8 oz) 118.57 kg (261 lb 6.4 oz)    Intake/Output: I/O last 3 completed shifts: In: 11 [I.V.:93] Out: 2000 [Other:2000]   Intake/Output this shift:  Total I/O In: 120 [P.O.:120] Out: -   Physical Exam: General: NAD, resting in bed  Head: Normocephalic, atraumatic. Moist oral mucosal membranes  Eyes: Anicteric  Neck: Supple, trachea midline  Lungs:  Clear to auscultation  Heart: S1S2 no rubs  Abdomen:  Soft, nontender, BS present   Extremities: Trace LLE edema, RLE amputation.  Neurologic: Nonfocal, moving all four extremities  Skin: No lesions  Access: R IJ permcath    Basic Metabolic Panel:  Recent Labs Lab 09/12/15 2117 09/14/15 1608  NA 138  --   K 3.4*  --   CL 98*  --   CO2 31  --   GLUCOSE 210*  --   BUN 26*  --   CREATININE 3.83*  --   CALCIUM 9.3  --   PHOS  --  4.2    Liver Function Tests: No results for input(s): AST, ALT, ALKPHOS, BILITOT, PROT, ALBUMIN in the last 168 hours. No results for input(s): LIPASE, AMYLASE in the last 168 hours. No results for input(s): AMMONIA in the last 168 hours.  CBC:  Recent Labs Lab 09/12/15 2117  WBC 6.9  HGB 10.1*  HCT 30.5*  MCV 84.8  PLT 328    Cardiac Enzymes:  Recent Labs Lab 09/12/15 2117  09/13/15 0227 09/13/15 0826 09/13/15 1423  TROPONINI 0.06* 0.08* 0.08* 0.07*    BNP: Invalid input(s): POCBNP  CBG:  Recent Labs Lab 09/14/15 1529 09/14/15 2052 09/15/15 0109 09/15/15 0743 09/15/15 1142  GLUCAP 102* 153* 149* 139* 148*    Microbiology: Results for orders placed or performed during the hospital encounter of 09/12/15  MRSA PCR Screening     Status: None   Collection Time: 09/13/15  6:08 AM  Result Value Ref Range Status   MRSA by PCR NEGATIVE NEGATIVE Final    Comment:        The GeneXpert MRSA Assay (FDA approved for NASAL specimens only), is one component of a comprehensive MRSA colonization surveillance program. It is not intended to diagnose MRSA infection nor to guide or monitor treatment for MRSA infections.     Coagulation Studies: No results for input(s): LABPROT, INR in the last 72 hours.  Urinalysis: No results for input(s): COLORURINE, LABSPEC, PHURINE, GLUCOSEU, HGBUR, BILIRUBINUR, KETONESUR, PROTEINUR, UROBILINOGEN, NITRITE, LEUKOCYTESUR in the last 72 hours.  Invalid input(s): APPERANCEUR    Imaging: No results found.   Medications:     . aspirin EC  325 mg Oral Daily  . calcitRIOL  0.25 mcg Oral Daily  . calcium acetate  2,001 mg Oral TID WC  .  calcium carbonate  1 tablet Oral TID WC  . clopidogrel  75 mg Oral Q breakfast  . divalproex  1,000 mg Oral TID  . docusate sodium  100 mg Oral QHS  . doxycycline  100 mg Oral BID  . [START ON 09/16/2015] epoetin (EPOGEN/PROCRIT) injection  4,000 Units Intravenous Q T,Th,Sa-HD  . erythromycin  250 mg Oral QID  . heparin  5,000 Units Subcutaneous Q8H  . hydrocortisone  25 mg Rectal Daily  . insulin aspart  0-9 Units Subcutaneous TID WC  . insulin glargine  5 Units Subcutaneous QHS  . isosorbide mononitrate  60 mg Oral Daily  . levETIRAcetam  500 mg Oral q morning - 10a  . lisinopril  40 mg Oral Daily  . metoCLOPramide  10 mg Oral QID  . metoprolol tartrate  50 mg Oral BID   . nicotine  14 mg Transdermal Daily  . nystatin  1 g Topical BID  . pantoprazole  80 mg Oral Daily  . polyethylene glycol  17 g Oral BID  . ranolazine  500 mg Oral BID  . risperiDONE  1 mg Oral BID  . senna  2 tablet Oral BID  . sodium chloride flush  3 mL Intravenous Q12H  . sodium chloride flush  3 mL Intravenous Q12H  . torsemide  100 mg Oral Daily   sodium chloride, acetaminophen, albuterol, bisacodyl, guaiFENesin-dextromethorphan, lactulose, metolazone, nitroGLYCERIN, ondansetron **OR** ondansetron (ZOFRAN) IV, oxyCODONE, senna, sodium chloride flush  Assessment/ Plan:  50 y.o. female  with past medical history of end-stage renal disease on peritoneal dialysis, asthma, gastroparesis, diabetes mellitus, congestive heart failure, hypertension, endometriosis, coronary atherosclerosis/medical management, PD peritonitis with micrococcus treated with meropenem and IV/IP vancomycin  [facility]/[address]/peritoneal dialysis  1. End-stage renal disease on hemodialysis dialysis, TTHS: No acute indication for dialysis today. We will plan for hemodialysis again tomorrow.  2. Anemia of CKD:  - Continue Epogen 4000 units IV with dialysis.  3. Hypertension:  -Blood pressure has ranged between 130/59-160/57. Continue lisinopril as well as metoprolol.  4. Secondary Hyperparathyroidism: Phosphorus 4.2 and PTH very low at 19. Discontinue oral calcitriol.  5.  Chest pain. Patient status post stent placement in the mid LAD.  LOS:  Tahj Njoku 6/16/20171:30 PM

## 2015-09-15 NOTE — Progress Notes (Addendum)
CSW updated MD Sudinin. He signed 30 day note. CSW faxed required information to [facility] awaiting PASRR determination. CSW reached out to [place] at [facility] to determine if they can accept patient with a pending PASRR number.   Per [place] they cannot accept patient back until she has an updated PASRR number. PASRR screener received additional information. CSW awaiting PASRR determination. CSW will continue to follow and assist.   Woodroe Mode, MSW, LCSW-A Clinical Social Work Department [phone]

## 2015-09-15 NOTE — Progress Notes (Signed)
CSW was informed by [place] at [facility] that patient's PASRR number expires today 09/15/15 and that they canceled her PASRR evaluation yesterday 09/14/15 due to patient being admitted int [facility]. Reported that they cannot accept patient with an expired PASRR. CSW contacted [facility] to determine if they could process a 30 day PASRR for patient they declined. Reported that patient would have to "sit" in the hospital until a level 2 PARR is submitted. Requested CSW to submit documents for Level 2 PASRR screening. CSW paged MD Sudini to inform him of above. Awaiting phone call back.  Woodroe Mode, MSW, LCSW-A Clinical Social Work Department [phone]

## 2015-09-15 NOTE — Discharge Instructions (Signed)
°  DIET:  °Diabetic diet and Renal diet ° °DISCHARGE CONDITION:  °Stable ° °ACTIVITY:  °Activity as tolerated ° °OXYGEN:  °Home Oxygen: No. °  °Oxygen Delivery: room air ° °DISCHARGE LOCATION:  °nursing home  ° °If you experience worsening of your admission symptoms, develop shortness of breath, life threatening emergency, suicidal or homicidal thoughts you must seek medical attention immediately by calling 911 or calling your MD immediately  if symptoms less severe. ° °You Must read complete instructions/literature along with all the possible adverse reactions/side effects for all the Medicines you take and that have been prescribed to you. Take any new Medicines after you have completely understood and accpet all the possible adverse reactions/side effects.  ° °Please note ° °You were cared for by a hospitalist during your hospital stay. If you have any questions about your discharge medications or the care you received while you were in the hospital after you are discharged, you can call the unit and asked to speak with the hospitalist on call if the hospitalist that took care of you is not available. Once you are discharged, your primary care physician will handle any further medical issues. Please note that NO REFILLS for any discharge medications will be authorized once you are discharged, as it is imperative that you return to your primary care physician (or establish a relationship with a primary care physician if you do not have one) for your aftercare needs so that they can reassess your need for medications and monitor your lab values. ° ° ° °

## 2015-09-15 NOTE — Discharge Summary (Signed)
[facility] - [place] at [facility]   PATIENT NAME: Kimberly Montgomery    MR#:  [id number]  DATE OF BIRTH:  [birth year]  DATE OF ADMISSION:  09/12/2015 ADMITTING PHYSICIAN: Arnaldo Natal, MD  DATE OF DISCHARGE: No discharge date for patient encounter.  PRIMARY CARE PHYSICIAN: DALE, PATRICK D, MD   ADMISSION DIAGNOSIS:  Acute coronary syndrome (HCC) [I24.9]  DISCHARGE DIAGNOSIS:  Active Problems:   Chest pain   SECONDARY DIAGNOSIS:   Past Medical History  Diagnosis Date  . Renal disorder   . Hypertension   . CHF (congestive heart failure) (HCC)   . Diabetes mellitus without complication (HCC)   . Gastroparesis   . Asthma   . ESRD (end stage renal disease) (HCC)   . Endometriosis   . CAD (coronary artery disease)   . HLD (hyperlipidemia)   . GERD (gastroesophageal reflux disease)      ADMITTING HISTORY Chief Complaint: Chest pain HPI: The patient with past medical history significant for end-stage renal disease, CAD and diabetes mellitus presents to the emergency department complaining of chest pain. She states the pain began a few hours after her dialysis treatment this morning. She took 2 nitroglycerin tablets which relieved her pain briefly. The pain returned approximately 5 hours later. She describes it as a sensation summary sitting on her chest. She had associated shortness of breath but denies nausea, vomiting or diaphoresis. She states the pain is similar to that she's had before but differs in that it was substernal and not under her left breast. Troponin was found to be mildly elevated which prompted emergency department staff to call for admission.   HOSPITAL COURSE:   * Unstable angina * IDDM * HTN * ESRD * Anemia of chronic   Admitted to telemetry floor. Troponin normal. But due to symptoms Patient had a repeat cardiac catheterization. Had a drug eluting stent placed in mid LAD. Symptoms have improved significantly. Patient is on  aspirin, Plavix, statin, beta blocker.  Diabetes medications and blood pressure medications continued in the hospital. Metoprolol added.  Nephrology followed the patient during hospital stay for her dialysis needs.   Stable for discharge back to rehabilitation today.   CONSULTS OBTAINED:  Treatment Team:  Laurier Nancy, MD Mady Haagensen, MD  DRUG ALLERGIES:   Allergies  Allergen Reactions  . Cephalosporins Anaphylaxis  . Penicillins Anaphylaxis and Other (See Comments)    Has patient had a PCN reaction causing immediate rash, facial/tongue/throat swelling, SOB or lightheadedness with hypotension: Yes Has patient had a PCN reaction causing severe rash involving mucus membranes or skin necrosis: No Has patient had a PCN reaction that required hospitalization No Has patient had a PCN reaction occurring within the last 10 years: No If all of the above answers are "NO", then may proceed with Cephalosporin use.  . Lamictal [Lamotrigine] Other (See Comments)    Reaction:  Hallucinations  . Phenergan [Promethazine Hcl] Nausea And Vomiting  . Pravastatin Other (See Comments)    Reaction:  Muscle pain   . Sulfa Antibiotics Other (See Comments)    Reaction:  Unknown     DISCHARGE MEDICATIONS:   Current Discharge Medication List    START taking these medications   Details  metoprolol (LOPRESSOR) 50 MG tablet Take 1 tablet (50 mg total) by mouth 2 (two) times daily.      CONTINUE these medications which have NOT CHANGED   Details  acetaminophen (TYLENOL) 325 MG tablet Take 650 mg by mouth every  6 (six) hours as needed for mild pain.     aspirin EC 81 MG tablet Take 81 mg by mouth daily.    bisacodyl (DULCOLAX) 10 MG suppository Place 10 mg rectally daily as needed for moderate constipation. Use if no relief from Milk of Magnesia    calcitRIOL (ROCALTROL) 0.25 MCG capsule Take 0.25 mcg by mouth daily.    calcium acetate (PHOSLO) 667 MG capsule Take 3 capsules (2,001 mg  total) by mouth 3 (three) times daily with meals. Qty: 90 capsule, Refills: 0    calcium carbonate (TUMS - DOSED IN MG ELEMENTAL CALCIUM) 500 MG chewable tablet Chew 1 tablet (200 mg of elemental calcium total) by mouth 3 (three) times daily with meals. Qty: 120 tablet, Refills: 0    clopidogrel (PLAVIX) 75 MG tablet Take 75 mg by mouth daily.    divalproex (DEPAKOTE) 500 MG DR tablet Take 1,000 mg by mouth 3 (three) times daily.    docusate sodium (COLACE) 100 MG capsule Take 100 mg by mouth at bedtime.     doxycycline (VIBRA-TABS) 100 MG tablet Take 100 mg by mouth 2 (two) times daily.    erythromycin (E-MYCIN) 250 MG tablet Take 250 mg by mouth 4 (four) times daily.    hydrocortisone (ANUSOL-HC) 25 MG suppository Place 25 mg rectally daily.    insulin aspart (NOVOLOG) 100 UNIT/ML injection Inject 0-9 Units into the skin 3 (three) times daily with meals. 15 units three times a day with meals, 5 units with snacks    insulin glargine (LANTUS) 100 UNIT/ML injection Inject 0.08 mLs (8 Units total) into the skin at bedtime. Qty: 10 mL, Refills: 11    isosorbide mononitrate (IMDUR) 60 MG 24 hr tablet Take 1 tablet (60 mg total) by mouth daily. Qty: 30 tablet, Refills: 00    lactulose (CHRONULAC) 10 GM/15ML solution Take 10 g by mouth daily as needed for mild constipation.    levETIRAcetam (KEPPRA) 500 MG tablet Take 1 tablet (500 mg total) by mouth every morning. Qty: 30 tablet, Refills: 0    lisinopril (PRINIVIL,ZESTRIL) 40 MG tablet Take 40 mg by mouth daily. Refills: 3    metoCLOPramide (REGLAN) 10 MG tablet Take 1 tablet (10 mg total) by mouth 4 (four) times daily. Qty: 90 tablet, Refills: 0    metolazone (ZAROXOLYN) 5 MG tablet Take 5 mg by mouth daily as needed (fluid).    nicotine (NICODERM CQ - DOSED IN MG/24 HOURS) 14 mg/24hr patch Place 14 mg onto the skin daily.    nitroGLYCERIN (NITROSTAT) 0.4 MG SL tablet Place 0.4 mg under the tongue every 5 (five) minutes as needed  for chest pain. Reported on 07/10/2015    nystatin (MYCOSTATIN) powder Apply 1 g topically 2 (two) times daily.    omeprazole (PRILOSEC) 20 MG capsule Take 40 mg by mouth 2 (two) times daily before a meal.    ondansetron (ZOFRAN ODT) 4 MG disintegrating tablet Take 1 tablet (4 mg total) by mouth every 6 (six) hours as needed for nausea or vomiting. Qty: 20 tablet, Refills: 0    oxyCODONE (OXY IR/ROXICODONE) 5 MG immediate release tablet Take 1 tablet (5 mg total) by mouth every 6 (six) hours as needed for severe pain. Qty: 30 tablet, Refills: 0    polyethylene glycol (MIRALAX / GLYCOLAX) packet Take 17 g by mouth daily. Qty: 14 each, Refills: 0    ranolazine (RANEXA) 500 MG 12 hr tablet Take 1 tablet (500 mg total) by mouth 2 (two) times daily.  Qty: 60 tablet, Refills: 0    risperiDONE (RISPERDAL) 1 MG tablet Take 1 mg by mouth 2 (two) times daily.     !! senna (SENOKOT) 8.6 MG TABS tablet Take 2 tablets by mouth at bedtime as needed for mild constipation.    !! senna (SENOKOT) 8.6 MG TABS tablet Take 2 tablets by mouth 2 (two) times daily.    torsemide (DEMADEX) 100 MG tablet Take 100 mg by mouth daily.     !! - Potential duplicate medications found. Please discuss with provider.      Today   VITAL SIGNS:  Blood pressure 130/59, pulse 70, temperature 98.1 F (36.7 C), temperature source Oral, resp. rate 18, height 5\' 9"  (1.753 m), weight 118.57 kg (261 lb 6.4 oz), last menstrual period 03/16/2015, SpO2 93 %.  I/O:   Intake/Output Summary (Last 24 hours) at 09/15/15 0836 Last data filed at 09/15/15 0434  Gross per 24 hour  Intake     93 ml  Output   2000 ml  Net  -1907 ml    PHYSICAL EXAMINATION:  Physical Exam  GENERAL:  50 y.o.-year-old patient lying in the bed with no acute distress.  LUNGS: Normal breath sounds bilaterally CARDIOVASCULAR: S1, S2 normal. No murmurs, rubs, or gallops.  ABDOMEN: Soft, non-tender, non-distended. Bowel sounds present. No organomegaly  or mass.  NEUROLOGIC: Moves all 4 extremities. R BKA PSYCHIATRIC: The patient is alert and oriented x 3.   DATA REVIEW:   CBC  Recent Labs Lab 09/12/15 2117  WBC 6.9  HGB 10.1*  HCT 30.5*  PLT 328    Chemistries   Recent Labs Lab 09/12/15 2117  NA 138  K 3.4*  CL 98*  CO2 31  GLUCOSE 210*  BUN 26*  CREATININE 3.83*  CALCIUM 9.3    Cardiac Enzymes  Recent Labs Lab 09/13/15 1423  TROPONINI 0.07*    Microbiology Results  Results for orders placed or performed during the hospital encounter of 09/12/15  MRSA PCR Screening     Status: None   Collection Time: 09/13/15  6:08 AM  Result Value Ref Range Status   MRSA by PCR NEGATIVE NEGATIVE Final    Comment:        The GeneXpert MRSA Assay (FDA approved for NASAL specimens only), is one component of a comprehensive MRSA colonization surveillance program. It is not intended to diagnose MRSA infection nor to guide or monitor treatment for MRSA infections.     RADIOLOGY:  No results found.  Follow up with PCP in 1 week.  Management plans discussed with the patient, family and they are in agreement.  CODE STATUS:     Code Status Orders        Start     Ordered   09/13/15 0214  Full code   Continuous     09/13/15 0214    Code Status History    Date Active Date Inactive Code Status Order ID Comments User Context   08/24/2015  3:50 AM 08/31/2015  6:47 PM Full Code [id number]  Ihor Austin, MD Inpatient   07/16/2015  6:26 AM 08/02/2015  6:48 PM Full Code [id number]  Ihor Austin, MD ED   07/10/2015  1:49 AM 07/12/2015  1:01 PM Full Code [id number]  Oralia Manis, MD Inpatient   05/12/2015  3:46 PM 05/16/2015  5:35 PM Full Code [id number]  Auburn Bilberry, MD ED      TOTAL TIME TAKING CARE OF THIS PATIENT ON DAY OF DISCHARGE: more than 30 minutes.  Milagros Loll R M.D on 09/15/2015 at 8:36 AM  Between 7am to 6pm - Pager - [phone]  After 6pm go to [email or web address] - password [email or web address] [facility]  [place] [facility]  Office  [phone]  CC: Primary care physician; Jarome Matin, MD  Note: This dictation was prepared with Dragon dictation along with smaller phrase technology. Any transcriptional errors that result from this process are unintentional.

## 2015-09-18 ENCOUNTER — Encounter: Payer: Self-pay | Admitting: Cardiovascular Disease

## 2015-10-05 ENCOUNTER — Encounter: Payer: Self-pay | Admitting: Emergency Medicine

## 2015-10-05 ENCOUNTER — Emergency Department: Payer: Medicare HMO

## 2015-10-05 ENCOUNTER — Observation Stay
Admission: EM | Admit: 2015-10-05 | Discharge: 2015-10-06 | Disposition: A | Discharge status: Home / Self Care | Payer: Medicare HMO | Attending: Internal Medicine | Admitting: Internal Medicine

## 2015-10-05 DIAGNOSIS — Z880 Allergy status to penicillin: Secondary | ICD-10-CM | POA: Diagnosis not present

## 2015-10-05 DIAGNOSIS — Z7982 Long term (current) use of aspirin: Secondary | ICD-10-CM | POA: Diagnosis not present

## 2015-10-05 DIAGNOSIS — F319 Bipolar disorder, unspecified: Secondary | ICD-10-CM | POA: Insufficient documentation

## 2015-10-05 DIAGNOSIS — E876 Hypokalemia: Secondary | ICD-10-CM | POA: Insufficient documentation

## 2015-10-05 DIAGNOSIS — R569 Unspecified convulsions: Secondary | ICD-10-CM | POA: Diagnosis not present

## 2015-10-05 DIAGNOSIS — K567 Ileus, unspecified: Secondary | ICD-10-CM | POA: Diagnosis not present

## 2015-10-05 DIAGNOSIS — D631 Anemia in chronic kidney disease: Secondary | ICD-10-CM | POA: Diagnosis not present

## 2015-10-05 DIAGNOSIS — Z8249 Family history of ischemic heart disease and other diseases of the circulatory system: Secondary | ICD-10-CM | POA: Diagnosis not present

## 2015-10-05 DIAGNOSIS — R0789 Other chest pain: Secondary | ICD-10-CM | POA: Diagnosis not present

## 2015-10-05 DIAGNOSIS — Z7902 Long term (current) use of antithrombotics/antiplatelets: Secondary | ICD-10-CM | POA: Diagnosis not present

## 2015-10-05 DIAGNOSIS — Z818 Family history of other mental and behavioral disorders: Secondary | ICD-10-CM | POA: Insufficient documentation

## 2015-10-05 DIAGNOSIS — Z87891 Personal history of nicotine dependence: Secondary | ICD-10-CM | POA: Diagnosis not present

## 2015-10-05 DIAGNOSIS — Z955 Presence of coronary angioplasty implant and graft: Secondary | ICD-10-CM | POA: Insufficient documentation

## 2015-10-05 DIAGNOSIS — Z89511 Acquired absence of right leg below knee: Secondary | ICD-10-CM | POA: Insufficient documentation

## 2015-10-05 DIAGNOSIS — K219 Gastro-esophageal reflux disease without esophagitis: Secondary | ICD-10-CM | POA: Insufficient documentation

## 2015-10-05 DIAGNOSIS — Z888 Allergy status to other drugs, medicaments and biological substances status: Secondary | ICD-10-CM | POA: Diagnosis not present

## 2015-10-05 DIAGNOSIS — Z882 Allergy status to sulfonamides status: Secondary | ICD-10-CM | POA: Insufficient documentation

## 2015-10-05 DIAGNOSIS — Z794 Long term (current) use of insulin: Secondary | ICD-10-CM | POA: Diagnosis not present

## 2015-10-05 DIAGNOSIS — E1122 Type 2 diabetes mellitus with diabetic chronic kidney disease: Secondary | ICD-10-CM | POA: Diagnosis not present

## 2015-10-05 DIAGNOSIS — I2510 Atherosclerotic heart disease of native coronary artery without angina pectoris: Secondary | ICD-10-CM | POA: Insufficient documentation

## 2015-10-05 DIAGNOSIS — N186 End stage renal disease: Secondary | ICD-10-CM | POA: Diagnosis not present

## 2015-10-05 DIAGNOSIS — R079 Chest pain, unspecified: Secondary | ICD-10-CM | POA: Diagnosis present

## 2015-10-05 DIAGNOSIS — I132 Hypertensive heart and chronic kidney disease with heart failure and with stage 5 chronic kidney disease, or end stage renal disease: Secondary | ICD-10-CM | POA: Diagnosis not present

## 2015-10-05 DIAGNOSIS — N2581 Secondary hyperparathyroidism of renal origin: Secondary | ICD-10-CM | POA: Diagnosis not present

## 2015-10-05 DIAGNOSIS — E1143 Type 2 diabetes mellitus with diabetic autonomic (poly)neuropathy: Secondary | ICD-10-CM | POA: Diagnosis not present

## 2015-10-05 DIAGNOSIS — Z992 Dependence on renal dialysis: Secondary | ICD-10-CM | POA: Insufficient documentation

## 2015-10-05 DIAGNOSIS — E785 Hyperlipidemia, unspecified: Secondary | ICD-10-CM | POA: Insufficient documentation

## 2015-10-05 DIAGNOSIS — I509 Heart failure, unspecified: Secondary | ICD-10-CM | POA: Diagnosis not present

## 2015-10-05 DIAGNOSIS — J45909 Unspecified asthma, uncomplicated: Secondary | ICD-10-CM | POA: Insufficient documentation

## 2015-10-05 DIAGNOSIS — Z881 Allergy status to other antibiotic agents status: Secondary | ICD-10-CM | POA: Diagnosis not present

## 2015-10-05 DIAGNOSIS — Z809 Family history of malignant neoplasm, unspecified: Secondary | ICD-10-CM | POA: Diagnosis not present

## 2015-10-05 DIAGNOSIS — Z833 Family history of diabetes mellitus: Secondary | ICD-10-CM | POA: Diagnosis not present

## 2015-10-05 LAB — TROPONIN I: TROPONIN I: 0.03 ng/mL — AB (ref <0.03)

## 2015-10-05 LAB — BASIC METABOLIC PANEL
Anion gap: 5 (ref 5–15)
BUN: 16 mg/dL (ref 6–20)
CALCIUM: 8.3 mg/dL — AB (ref 8.9–10.3)
CO2: 34 mmol/L — ABNORMAL HIGH (ref 22–32)
CREATININE: 2.96 mg/dL — AB (ref 0.44–1.00)
Chloride: 97 mmol/L — ABNORMAL LOW (ref 101–111)
GFR, EST AFRICAN AMERICAN: 20 mL/min — AB (ref >60)
GFR, EST NON AFRICAN AMERICAN: 17 mL/min — AB (ref >60)
Glucose, Bld: 184 mg/dL — ABNORMAL HIGH (ref 65–99)
Potassium: 3 mmol/L — ABNORMAL LOW (ref 3.5–5.1)
SODIUM: 136 mmol/L (ref 135–145)

## 2015-10-05 LAB — CBC
HCT: 27 % — ABNORMAL LOW (ref 35.0–47.0)
Hemoglobin: 9.5 g/dL — ABNORMAL LOW (ref 12.0–16.0)
MCH: 31.6 pg (ref 26.0–34.0)
MCHC: 35.2 g/dL (ref 32.0–36.0)
MCV: 89.6 fL (ref 80.0–100.0)
Platelets: 169 10*3/uL (ref 150–440)
RBC: 3.01 MIL/uL — AB (ref 3.80–5.20)
RDW: 20.9 % — ABNORMAL HIGH (ref 11.5–14.5)
WBC: 4.4 10*3/uL (ref 3.6–11.0)

## 2015-10-05 LAB — PROTIME-INR
INR: 0.93
PROTHROMBIN TIME: 12.7 s (ref 11.4–15.0)

## 2015-10-05 LAB — BRAIN NATRIURETIC PEPTIDE: B NATRIURETIC PEPTIDE 5: 1751 pg/mL — AB (ref 0.0–100.0)

## 2015-10-05 MED ORDER — ONDANSETRON HCL 4 MG/2ML IJ SOLN
4.0000 mg | Freq: Once | INTRAMUSCULAR | Status: AC
  Administered 2015-10-05: 4 mg via INTRAVENOUS
  Filled 2015-10-05: qty 2

## 2015-10-05 MED ORDER — MORPHINE SULFATE (PF) 4 MG/ML IV SOLN
4.0000 mg | Freq: Once | INTRAVENOUS | Status: AC
Start: 2015-10-05 — End: 2015-10-05
  Administered 2015-10-05: 4 mg via INTRAVENOUS
  Filled 2015-10-05: qty 1

## 2015-10-05 NOTE — ED Provider Notes (Signed)
[facility] Emergency Department Provider Note  Time seen: 10:28 PM  I have reviewed the triage vital signs and the nursing notes.   HISTORY  Chief Complaint Chest Pain    HPI Kimberly Montgomery is a 50 y.o. female with a past medical history of CK D, hypertension, CHF, diabetes, status post multiple cardiac stents including one placed 3 weeks ago, presents the emergency department chest pain. According to the patient at 8 PM tonight she fell 9/10 severe sharp chest pain associated with nausea and shortness of breath. Denies any diaphoresis. Patient states she felt pain radiating to her left arm down to her left wrist. States the pain felt very similar to the last time she experienced chest pain requiring a cardiac stent replaced 3 weeks ago 09/14/15. Patient took 3 nitroglycerin tablets at her nursing home without improvement of the pain so she came to the emergency department. Currently the patient rates the pain as a 7/10 somewhat improved from 9/10 earlier. Denies any current nausea, diaphoresis or shortness of breath. Denies any leg pain. The patient is status post right BKA.     Past Medical History  Diagnosis Date  . Renal disorder   . Hypertension   . CHF (congestive heart failure) (HCC)   . Diabetes mellitus without complication (HCC)   . Gastroparesis   . Asthma   . ESRD (end stage renal disease) (HCC)   . Endometriosis   . CAD (coronary artery disease)   . HLD (hyperlipidemia)   . GERD (gastroesophageal reflux disease)     Patient Active Problem List   Diagnosis Date Noted  . Bipolar I disorder, most recent episode depressed (HCC)   . Altered mental status 08/24/2015  . ESRD (end stage renal disease) (HCC)   . Ileus (HCC)   . Bipolar I disorder (HCC) 07/25/2015  . Seizures (HCC) 07/25/2015  . Peritonitis (HCC) 07/16/2015  . Unstable angina (HCC) 07/09/2015  . ESRD on peritoneal dialysis (HCC) 07/09/2015  . Accelerated hypertension 07/09/2015   . Type 2 diabetes mellitus (HCC) 07/09/2015  . CAD (coronary artery disease) 07/09/2015  . HLD (hyperlipidemia) 07/09/2015  . GERD (gastroesophageal reflux disease) 07/09/2015  . Chest pain 05/12/2015    Past Surgical History  Procedure Laterality Date  . Hysterotomy    . Below knee leg amputation    . Pd tube inserstion    . Hd fistula surgery with reversal    . Cardiac catheterization Right 07/10/2015    Procedure: Left Heart Cath and Coronary Angiography;  Surgeon: Laurier Nancy, MD;  Location: [facility] INVASIVE CV LAB;  Service: Cardiovascular;  Laterality: Right;  . Peripheral vascular catheterization N/A 08/30/2015    Procedure: Dialysis/Perma Catheter Insertion;  Surgeon: Annice Needy, MD;  Location: [facility] INVASIVE CV LAB;  Service: Cardiovascular;  Laterality: N/A;  . Cardiac catheterization Right 09/14/2015    Procedure: Left Heart Cath and Coronary Angiography;  Surgeon: Laurier Nancy, MD;  Location: [facility] INVASIVE CV LAB;  Service: Cardiovascular;  Laterality: Right;  . Cardiac catheterization N/A 09/14/2015    Procedure: Coronary Stent Intervention;  Surgeon: Alwyn Pea, MD;  Location: [facility] INVASIVE CV LAB;  Service: Cardiovascular;  Laterality: N/A;    Current Outpatient Rx  Name  Route  Sig  Dispense  Refill  . acetaminophen (TYLENOL) 325 MG tablet   Oral   Take 650 mg by mouth every 6 (six) hours as needed for mild pain.          [name]  aspirin EC 81 MG tablet   Oral   Take 81 mg by mouth daily.         . bisacodyl (DULCOLAX) 10 MG suppository   Rectal   Place 10 mg rectally daily as needed for moderate constipation. Use if no relief from Milk of Magnesia         . calcitRIOL (ROCALTROL) 0.25 MCG capsule   Oral   Take 0.25 mcg by mouth daily.         . calcium acetate (PHOSLO) 667 MG capsule   Oral   Take 3 capsules (2,001 mg total) by mouth 3 (three) times daily with meals.   90 capsule   0   . calcium carbonate (TUMS - DOSED IN MG ELEMENTAL CALCIUM)  500 MG chewable tablet   Oral   Chew 1 tablet (200 mg of elemental calcium total) by mouth 3 (three) times daily with meals.   120 tablet   0   . clopidogrel (PLAVIX) 75 MG tablet   Oral   Take 75 mg by mouth daily.         . divalproex (DEPAKOTE) 500 MG DR tablet   Oral   Take 1,000 mg by mouth 3 (three) times daily.         [name] docusate sodium (COLACE) 100 MG capsule   Oral   Take 100 mg by mouth at bedtime.          [name] erythromycin (E-MYCIN) 250 MG tablet   Oral   Take 250 mg by mouth 4 (four) times daily.         . insulin aspart (NOVOLOG) 100 UNIT/ML injection   Subcutaneous   Inject 0-9 Units into the skin 3 (three) times daily with meals. 15 units three times a day with meals, 5 units with snacks         . insulin glargine (LANTUS) 100 UNIT/ML injection   Subcutaneous   Inject 0.08 mLs (8 Units total) into the skin at bedtime.   10 mL   11   . isosorbide mononitrate (IMDUR) 60 MG 24 hr tablet   Oral   Take 1 tablet (60 mg total) by mouth daily.   30 tablet   00   . lactulose (CHRONULAC) 10 GM/15ML solution   Oral   Take 10 g by mouth daily as needed for mild constipation.         . levETIRAcetam (KEPPRA) 500 MG tablet   Oral   Take 1 tablet (500 mg total) by mouth every morning.   30 tablet   0   . lisinopril (PRINIVIL,ZESTRIL) 40 MG tablet   Oral   Take 40 mg by mouth daily.      3   . metoCLOPramide (REGLAN) 10 MG tablet   Oral   Take 1 tablet (10 mg total) by mouth 4 (four) times daily.   90 tablet   0   . metolazone (ZAROXOLYN) 5 MG tablet   Oral   Take 5 mg by mouth daily as needed (fluid).         . metoprolol (LOPRESSOR) 50 MG tablet   Oral   Take 1 tablet (50 mg total) by mouth 2 (two) times daily.         . nicotine (NICODERM CQ - DOSED IN MG/24 HOURS) 14 mg/24hr patch   Transdermal   Place 14 mg onto the skin daily.         . nitroGLYCERIN (NITROSTAT) 0.4 MG SL tablet  Sublingual   Place 0.4 mg under the tongue  every 5 (five) minutes as needed for chest pain. Reported on 07/10/2015         . nystatin (MYCOSTATIN) powder   Topical   Apply 1 g topically 2 (two) times daily.         [name] omeprazole (PRILOSEC) 20 MG capsule   Oral   Take 40 mg by mouth 2 (two) times daily before a meal.         . ondansetron (ZOFRAN ODT) 4 MG disintegrating tablet   Oral   Take 1 tablet (4 mg total) by mouth every 6 (six) hours as needed for nausea or vomiting.   20 tablet   0   . oxyCODONE (OXY IR/ROXICODONE) 5 MG immediate release tablet   Oral   Take 1 tablet (5 mg total) by mouth every 6 (six) hours as needed for severe pain.   20 tablet   0   . polyethylene glycol (MIRALAX / GLYCOLAX) packet   Oral   Take 17 g by mouth daily. Patient taking differently: Take 17 g by mouth 2 (two) times daily.    14 each   0   . ranolazine (RANEXA) 500 MG 12 hr tablet   Oral   Take 1 tablet (500 mg total) by mouth 2 (two) times daily.   60 tablet   0   . risperiDONE (RISPERDAL) 1 MG tablet   Oral   Take 1 mg by mouth 2 (two) times daily.          [name] senna (SENOKOT) 8.6 MG TABS tablet   Oral   Take 2 tablets by mouth at bedtime as needed for mild constipation.         . senna (SENOKOT) 8.6 MG TABS tablet   Oral   Take 2 tablets by mouth 2 (two) times daily.         [name] torsemide (DEMADEX) 100 MG tablet   Oral   Take 100 mg by mouth daily.           Allergies Cephalosporins; Penicillins; Lamictal; Phenergan; Pravastatin; and Sulfa antibiotics  Family History  Problem Relation Age of Onset  . CAD    . Diabetes    . Bipolar disorder    . Cervical cancer Mother     Social History Social History  Substance Use Topics  . Smoking status: Former Smoker -- 0.50 packs/day    Types: Cigarettes  . Smokeless tobacco: None  . Alcohol Use: No    Review of Systems Constitutional: Negative for fever. Cardiovascular: Positive for chest pain Respiratory: Negative for shortness of  breath. Gastrointestinal: Negative for abdominal pain Musculoskeletal: Negative for back pain. Neurological: Negative for headache 10-point ROS otherwise negative.  ____________________________________________   PHYSICAL EXAM:  VITAL SIGNS: ED Triage Vitals  Enc Vitals Group     BP 10/05/15 2139 161/73 mmHg     Pulse Rate 10/05/15 2139 93     Resp 10/05/15 2139 16     Temp 10/05/15 2139 98.4 F (36.9 C)     Temp Source 10/05/15 2139 Oral     SpO2 10/05/15 2139 96 %     Weight --      Height --      Head Cir --      Peak Flow --      Pain Score 10/05/15 2140 8     Pain Loc --      Pain Edu? --  Excl. in GC? --     Constitutional: Alert and oriented. Well appearing and in no distress.Obese. Eyes: Normal exam ENT   Head: Normocephalic and atraumatic.   Mouth/Throat: Mucous membranes are moist. Cardiovascular: Normal rate, regular rhythm. No murmur Respiratory: Normal respiratory effort without tachypnea nor retractions. Breath sounds are clear Gastrointestinal: Soft and nontender. No distention. Musculoskeletal: Right BKA. Mild edema left lower extremity. Neurologic:  Normal speech and language. No gross focal neurologic deficits Skin:  Skin is warm, dry and intact.  Psychiatric: Mood and affect are normal.   ____________________________________________    EKG  EKG reviewed and interpreted by myself shows normal sinus rhythm at 88 bpm, narrow QRS, normal axis, normal intervals, nonspecific but no concerning ST changes.  ____________________________________________    RADIOLOGY  Chest x-ray negative.  ____________________________________________    INITIAL IMPRESSION / ASSESSMENT AND PLAN / ED COURSE  Pertinent labs & imaging results that were available during my care of the patient were reviewed by me and considered in my medical decision making (see chart for details).  Patient presents to emergency department with chest pain beginning at 8  PM tonight. Patient continues with chest pain in the emergency department. We will check labs. Given the patient's recent stent placement 3 weeks ago she is a high risk for restenosis, given her history she will likely require admission to the hospital for further monitoring and treatment. We will dose morphine, obtain labs and reevaluate.  Labs largely within normal limits. Troponin negative. Given the patient's recent cardiac stent placed 3 weeks ago and chest pain identical to past chest pain requiring stent we will admit to the hospital for further evaluation and treatment.  ____________________________________________   FINAL CLINICAL IMPRESSION(S) / ED DIAGNOSES  Chest pain   Minna Antis, MD 10/05/15 2338

## 2015-10-05 NOTE — ED Notes (Addendum)
Pt to ED via [facility] for c/o CP. Pt c/o CP radiating to left arm. Pt received 3 nitro at facility and 324 Asprin en route. Pt has hx of MI, 3stents in place. Pt on dialysis tues,thurs,sat. Pt has been dialyzed x3days in a row due to increased fluid. Pt A&O

## 2015-10-06 ENCOUNTER — Observation Stay: Admit: 2015-10-06 | Payer: Medicare HMO

## 2015-10-06 LAB — BASIC METABOLIC PANEL
ANION GAP: 4 — AB (ref 5–15)
BUN: 18 mg/dL (ref 6–20)
CALCIUM: 8.1 mg/dL — AB (ref 8.9–10.3)
CO2: 33 mmol/L — ABNORMAL HIGH (ref 22–32)
Chloride: 100 mmol/L — ABNORMAL LOW (ref 101–111)
Creatinine, Ser: 3.15 mg/dL — ABNORMAL HIGH (ref 0.44–1.00)
GFR calc Af Amer: 18 mL/min — ABNORMAL LOW (ref >60)
GFR, EST NON AFRICAN AMERICAN: 16 mL/min — AB (ref >60)
GLUCOSE: 199 mg/dL — AB (ref 65–99)
Potassium: 3.4 mmol/L — ABNORMAL LOW (ref 3.5–5.1)
SODIUM: 137 mmol/L (ref 135–145)

## 2015-10-06 LAB — GLUCOSE, CAPILLARY
GLUCOSE-CAPILLARY: 145 mg/dL — AB (ref 65–99)
Glucose-Capillary: 162 mg/dL — ABNORMAL HIGH (ref 65–99)
Glucose-Capillary: 167 mg/dL — ABNORMAL HIGH (ref 65–99)

## 2015-10-06 LAB — CBC
HCT: 25.7 % — ABNORMAL LOW (ref 35.0–47.0)
HEMOGLOBIN: 8.6 g/dL — AB (ref 12.0–16.0)
MCH: 30.5 pg (ref 26.0–34.0)
MCHC: 33.5 g/dL (ref 32.0–36.0)
MCV: 90.8 fL (ref 80.0–100.0)
Platelets: 159 10*3/uL (ref 150–440)
RBC: 2.83 MIL/uL — ABNORMAL LOW (ref 3.80–5.20)
RDW: 20.7 % — AB (ref 11.5–14.5)
WBC: 4.6 10*3/uL (ref 3.6–11.0)

## 2015-10-06 LAB — LIPID PANEL
CHOLESTEROL: 252 mg/dL — AB (ref 0–200)
HDL: 69 mg/dL (ref >40)
LDL Cholesterol: 142 mg/dL — ABNORMAL HIGH (ref 0–99)
TRIGLYCERIDES: 207 mg/dL — AB (ref <150)
Total CHOL/HDL Ratio: 3.7 RATIO
VLDL: 41 mg/dL — AB (ref 0–40)

## 2015-10-06 LAB — TROPONIN I
TROPONIN I: 0.05 ng/mL — AB (ref <0.03)
TROPONIN I: 0.03 ng/mL — AB (ref <0.03)

## 2015-10-06 MED ORDER — DOCUSATE SODIUM 100 MG PO CAPS: 100.0000 mg | ORAL_CAPSULE | Freq: Every day | ORAL | Status: DC

## 2015-10-06 MED ORDER — ERYTHROMYCIN BASE 250 MG PO TABS
250.0000 mg | ORAL_TABLET | Freq: Four times a day (QID) | ORAL | Status: DC
  Administered 2015-10-06 (×2): 250 mg via ORAL
  Filled 2015-10-06 (×4): qty 1

## 2015-10-06 MED ORDER — DIVALPROEX SODIUM 500 MG PO DR TAB
1000.0000 mg | DELAYED_RELEASE_TABLET | Freq: Three times a day (TID) | ORAL | Status: DC
  Administered 2015-10-06: 1000 mg via ORAL
  Filled 2015-10-06: qty 2

## 2015-10-06 MED ORDER — POLYETHYLENE GLYCOL 3350 17 G PO PACK
17.0000 g | PACK | Freq: Every day | ORAL | Status: DC
  Filled 2015-10-06: qty 1

## 2015-10-06 MED ORDER — SODIUM CHLORIDE 0.9% FLUSH: 3.0000 mL | INTRAVENOUS | Status: DC | PRN

## 2015-10-06 MED ORDER — ACETAMINOPHEN 325 MG PO TABS
650.0000 mg | ORAL_TABLET | Freq: Four times a day (QID) | ORAL | Status: DC | PRN
  Administered 2015-10-06: 650 mg via ORAL
  Filled 2015-10-06: qty 2

## 2015-10-06 MED ORDER — INSULIN ASPART 100 UNIT/ML ~~LOC~~ SOLN
0.0000 [IU] | Freq: Three times a day (TID) | SUBCUTANEOUS | Status: DC
  Administered 2015-10-06: 3 [IU] via SUBCUTANEOUS
  Administered 2015-10-06: 2 [IU] via SUBCUTANEOUS
  Filled 2015-10-06: qty 3
  Filled 2015-10-06: qty 2

## 2015-10-06 MED ORDER — RANOLAZINE ER 500 MG PO TB12
500.0000 mg | ORAL_TABLET | Freq: Two times a day (BID) | ORAL | Status: DC
  Administered 2015-10-06 (×2): 500 mg via ORAL
  Filled 2015-10-06 (×2): qty 1

## 2015-10-06 MED ORDER — ASPIRIN 81 MG PO CHEW
324.0000 mg | CHEWABLE_TABLET | ORAL | Status: AC
  Administered 2015-10-06: 324 mg via ORAL
  Filled 2015-10-06: qty 4

## 2015-10-06 MED ORDER — ISOSORBIDE MONONITRATE ER 60 MG PO TB24
60.0000 mg | ORAL_TABLET | Freq: Every day | ORAL | Status: DC
  Administered 2015-10-06: 60 mg via ORAL
  Filled 2015-10-06: qty 1

## 2015-10-06 MED ORDER — LISINOPRIL 20 MG PO TABS
40.0000 mg | ORAL_TABLET | Freq: Every day | ORAL | Status: DC
  Administered 2015-10-06: 40 mg via ORAL
  Filled 2015-10-06: qty 2

## 2015-10-06 MED ORDER — ONDANSETRON 4 MG PO TBDP
4.0000 mg | ORAL_TABLET | Freq: Four times a day (QID) | ORAL | Status: DC | PRN
  Filled 2015-10-06: qty 1

## 2015-10-06 MED ORDER — PANTOPRAZOLE SODIUM 40 MG PO TBEC: 40.0000 mg | DELAYED_RELEASE_TABLET | Freq: Two times a day (BID) | ORAL | Status: DC

## 2015-10-06 MED ORDER — NICOTINE 14 MG/24HR TD PT24
14.0000 mg | MEDICATED_PATCH | Freq: Every day | TRANSDERMAL | Status: DC
  Administered 2015-10-06: 14 mg via TRANSDERMAL
  Filled 2015-10-06: qty 1

## 2015-10-06 MED ORDER — CLOPIDOGREL BISULFATE 75 MG PO TABS
75.0000 mg | ORAL_TABLET | Freq: Every day | ORAL | Status: DC
  Administered 2015-10-06: 75 mg via ORAL
  Filled 2015-10-06: qty 1

## 2015-10-06 MED ORDER — INSULIN GLARGINE 100 UNIT/ML ~~LOC~~ SOLN
8.0000 [IU] | Freq: Every day | SUBCUTANEOUS | Status: DC
  Filled 2015-10-06: qty 0.08

## 2015-10-06 MED ORDER — LEVETIRACETAM 500 MG PO TABS
500.0000 mg | ORAL_TABLET | Freq: Every morning | ORAL | Status: DC
  Administered 2015-10-06: 500 mg via ORAL
  Filled 2015-10-06: qty 1

## 2015-10-06 MED ORDER — ASPIRIN EC 81 MG PO TBEC: 81.0000 mg | DELAYED_RELEASE_TABLET | Freq: Every day | ORAL | Status: DC

## 2015-10-06 MED ORDER — TORSEMIDE 20 MG PO TABS
100.0000 mg | ORAL_TABLET | Freq: Every day | ORAL | Status: DC
  Administered 2015-10-06: 100 mg via ORAL
  Filled 2015-10-06: qty 5

## 2015-10-06 MED ORDER — PANTOPRAZOLE SODIUM 40 MG PO TBEC
40.0000 mg | DELAYED_RELEASE_TABLET | Freq: Every day | ORAL | Status: DC
  Administered 2015-10-06: 40 mg via ORAL
  Filled 2015-10-06: qty 1

## 2015-10-06 MED ORDER — SODIUM CHLORIDE 0.9% FLUSH
3.0000 mL | Freq: Two times a day (BID) | INTRAVENOUS | Status: DC
  Administered 2015-10-06 (×2): 3 mL via INTRAVENOUS

## 2015-10-06 MED ORDER — RISPERIDONE 0.5 MG PO TABS
1.0000 mg | ORAL_TABLET | Freq: Three times a day (TID) | ORAL | Status: DC
  Administered 2015-10-06: 1 mg via ORAL
  Filled 2015-10-06: qty 2

## 2015-10-06 MED ORDER — METOCLOPRAMIDE HCL 10 MG PO TABS
10.0000 mg | ORAL_TABLET | Freq: Four times a day (QID) | ORAL | Status: DC
  Filled 2015-10-06: qty 1

## 2015-10-06 MED ORDER — HYDRALAZINE HCL 20 MG/ML IJ SOLN
10.0000 mg | INTRAMUSCULAR | Status: DC | PRN
  Administered 2015-10-06: 10 mg via INTRAVENOUS
  Filled 2015-10-06: qty 1

## 2015-10-06 MED ORDER — CALCITRIOL 0.25 MCG PO CAPS
0.2500 ug | ORAL_CAPSULE | Freq: Every day | ORAL | Status: DC
  Administered 2015-10-06: 0.25 ug via ORAL
  Filled 2015-10-06: qty 1

## 2015-10-06 MED ORDER — ASPIRIN 300 MG RE SUPP: 300.0000 mg | RECTAL | Status: AC

## 2015-10-06 MED ORDER — METOPROLOL TARTRATE 50 MG PO TABS
50.0000 mg | ORAL_TABLET | Freq: Two times a day (BID) | ORAL | Status: DC
  Administered 2015-10-06 (×2): 50 mg via ORAL
  Filled 2015-10-06 (×2): qty 1

## 2015-10-06 MED ORDER — CALCIUM ACETATE (PHOS BINDER) 667 MG PO CAPS
2001.0000 mg | ORAL_CAPSULE | Freq: Three times a day (TID) | ORAL | Status: DC
  Administered 2015-10-06: 2001 mg via ORAL
  Filled 2015-10-06 (×2): qty 3

## 2015-10-06 MED ORDER — NITROGLYCERIN 0.4 MG SL SUBL: 0.4000 mg | SUBLINGUAL_TABLET | SUBLINGUAL | Status: DC | PRN

## 2015-10-06 MED ORDER — RANOLAZINE ER 500 MG PO TB12: 1000.0000 mg | ORAL_TABLET | Freq: Two times a day (BID) | ORAL | Status: DC

## 2015-10-06 MED ORDER — INSULIN ASPART 100 UNIT/ML ~~LOC~~ SOLN: 0.0000 [IU] | Freq: Every day | SUBCUTANEOUS | Status: DC

## 2015-10-06 MED ORDER — SENNA 8.6 MG PO TABS
2.0000 | ORAL_TABLET | Freq: Two times a day (BID) | ORAL | Status: DC
  Administered 2015-10-06: 17.2 mg via ORAL
  Filled 2015-10-06 (×2): qty 2

## 2015-10-06 MED ORDER — NITROGLYCERIN 0.4 MG SL SUBL
0.4000 mg | SUBLINGUAL_TABLET | SUBLINGUAL | Status: DC | PRN
  Administered 2015-10-06 (×5): 0.4 mg via SUBLINGUAL
  Filled 2015-10-06 (×4): qty 1

## 2015-10-06 MED ORDER — METOLAZONE 5 MG PO TABS
5.0000 mg | ORAL_TABLET | Freq: Every day | ORAL | Status: DC | PRN
  Filled 2015-10-06: qty 1

## 2015-10-06 MED ORDER — POTASSIUM CHLORIDE CRYS ER 20 MEQ PO TBCR
20.0000 meq | EXTENDED_RELEASE_TABLET | Freq: Once | ORAL | Status: AC
  Administered 2015-10-06: 20 meq via ORAL
  Filled 2015-10-06: qty 1

## 2015-10-06 MED ORDER — LACTULOSE 10 GM/15ML PO SOLN: 10.0000 g | Freq: Every day | ORAL | Status: DC | PRN

## 2015-10-06 MED ORDER — HEPARIN SODIUM (PORCINE) 5000 UNIT/ML IJ SOLN
5000.0000 [IU] | Freq: Three times a day (TID) | INTRAMUSCULAR | Status: DC
  Administered 2015-10-06 (×2): 5000 [IU] via SUBCUTANEOUS
  Filled 2015-10-06 (×2): qty 1

## 2015-10-06 MED ORDER — SODIUM CHLORIDE 0.9 % IV SOLN: 250.0000 mL | INTRAVENOUS | Status: DC | PRN

## 2015-10-06 NOTE — Progress Notes (Addendum)
Patient is from [facility]. Though she is observation she will require [employer] authorization to return to the facility. CSW requested [place]- Admissions at [facility] to begin [facility].    CSW spoke to [place]- Admissions at [facility]. Per [place] she's obtained [employer] and patient will go to room 1B if she discharges back to [facility] today. Patient assessment to follow. CSW will continue to follow and assist.   Woodroe Mode, MSW, LCSW-A, LCAS-A Clinical Social Worker [phone]

## 2015-10-06 NOTE — Progress Notes (Signed)
Patient admitted overnight under observation she is well known to me from previous admissions She reports that she had to receive hemodialysis 3 days in a row due to fluid overload Reports her chest pain is better. Recently had a LAD stent placed  1. Chest pain with chronic coronary artery disease with recent stent-await cardiology input continue Coreg and asa, plavix 2. Coronary artery disease with cardiac stent 3. Type 2 diabetes mellitus insulin-dependent continue therapy with Lantus and sliding scale 4. Essential Hypertension continue indoor and lisinopril and Lopressor 5. Mild hypokalemia replace 6. Hyperlipidemia under poor control

## 2015-10-06 NOTE — Discharge Instructions (Signed)
°  DIET:  Diabetic diet, renal diet  DISCHARGE CONDITION:  Stable  ACTIVITY:  Activity as tolerated  OXYGEN:  Home Oxygen: No.   Oxygen Delivery: room air  DISCHARGE LOCATION:  home    ADDITIONAL DISCHARGE INSTRUCTION:   If you experience worsening of your admission symptoms, develop shortness of breath, life threatening emergency, suicidal or homicidal thoughts you must seek medical attention immediately by calling 911 or calling your MD immediately  if symptoms less severe.  You Must read complete instructions/literature along with all the possible adverse reactions/side effects for all the Medicines you take and that have been prescribed to you. Take any new Medicines after you have completely understood and accpet all the possible adverse reactions/side effects.   Please note  You were cared for by a hospitalist during your hospital stay. If you have any questions about your discharge medications or the care you received while you were in the hospital after you are discharged, you can call the unit and asked to speak with the hospitalist on call if the hospitalist that took care of you is not available. Once you are discharged, your primary care physician will handle any further medical issues. Please note that NO REFILLS for any discharge medications will be authorized once you are discharged, as it is imperative that you return to your primary care physician (or establish a relationship with a primary care physician if you do not have one) for your aftercare needs so that they can reassess your need for medications and monitor your lab values.

## 2015-10-06 NOTE — Care Management (Addendum)
Patient placed in observation for chest pain. Patient is active at [facility] [address] TTS schedule. It is reported that patient has required dialysis three days in a row due to fluid overload.  From [facility].  Mild bump in troponin.  If third troponin is negative, anticipate discharge.  Do not anticipate need for dialysis treatment during this stay

## 2015-10-06 NOTE — Progress Notes (Addendum)
Report called to Shanda Bumps RN at [facility]. EMS notified of transport. IV and TELE removed per protocol. Patient assessment unchanged from this morning. Pt declined offer RN made to call family and update them.

## 2015-10-06 NOTE — Discharge Summary (Signed)
Kimberly Montgomery, 50 y.o., DOB 1965-04-05, MRN [id number]. Admission date: 10/05/2015 Discharge Date 10/06/2015 Primary MD Jarome Matin, MD Admitting Physician Ihor Austin, MD  Admission Diagnosis  Chest pain, unspecified chest pain type [R07.9]  Discharge Diagnosis   Active Problems:   Chest pain noncardiac   Coronary artery disease status post recent stent  Diabetes type 2  Essential hypertension Mild hypokalemia Hyperlipidemia End-stage renal disease          Hospital Course patient is a 50 year old white female well-known to our service who was recently hospitalized with chest pain and underwent a cardiac catheter and had a stent placed to the LAD. Presents back with chest pain. Her EKG and cardiac enzymes were nonrevealing. She was seen by cardiology and they didn't feel that her pain was cardiac related. Patient currently is chest pain-free and stable for discharge.            Consults  cardiology  Significant Tests:  See full reports for all details      Dg Chest 2 View  10/05/2015  CLINICAL DATA:  Chest pain today.  Pain radiates to the left arm. EXAM: CHEST  2 VIEW COMPARISON:  09/12/2014 FINDINGS: Right-sided dialysis catheter remains in place. The cardiomediastinal contours are unchanged, heart at the upper limits normal in size. No pulmonary edema. Blunting of the left costophrenic angles chronic. No consolidation or pneumothorax. No acute osseous abnormalities are seen. IMPRESSION: No acute process. Electronically Signed   By: Rubye Oaks M.D.   On: 10/05/2015 22:23   Dg Chest 2 View  09/12/2015  CLINICAL DATA:  Chest pain beginning at 11 a.m. this morning. EXAM: CHEST  2 VIEW COMPARISON:  PA and lateral scratch the single view of the chest 08/23/2015. PA and lateral chest 07/09/2015. FINDINGS: A new right IJ approach dialysis catheter is in place with the tip in the lower superior vena cava. Heart size is upper normal with vascular congestion identified. No  consolidative process, pneumothorax or effusion. No focal bony abnormality. IMPRESSION: No acute disease. Electronically Signed   By: Drusilla Kanner M.D.   On: 09/12/2015 21:44       Today   Subjective:   Kimberly Montgomery  nice chest pain currently feels well  Objective:   Blood pressure 173/62, pulse 66, temperature 98.2 F (36.8 C), temperature source Oral, resp. rate 18, height 5\' 9"  (1.753 m), weight 90.22 kg (198 lb 14.4 oz), last menstrual period 03/16/2015, SpO2 100 %.  .  Intake/Output Summary (Last 24 hours) at 10/06/15 1306 Last data filed at 10/06/15 1100  Gross per 24 hour  Intake    240 ml  Output      0 ml  Net    240 ml    Exam VITAL SIGNS: Blood pressure 173/62, pulse 66, temperature 98.2 F (36.8 C), temperature source Oral, resp. rate 18, height 5\' 9"  (1.753 m), weight 90.22 kg (198 lb 14.4 oz), last menstrual period 03/16/2015, SpO2 100 %.  GENERAL:  50 y.o.-year-old patient lying in the bed with no acute distress.  EYES: Pupils equal, round, reactive to light and accommodation. No scleral icterus. Extraocular muscles intact.  HEENT: Head atraumatic, normocephalic. Oropharynx and nasopharynx clear.  NECK:  Supple, no jugular venous distention. No thyroid enlargement, no tenderness.  LUNGS: Normal breath sounds bilaterally, no wheezing, rales,rhonchi or crepitation. No use of accessory muscles of respiration.  CARDIOVASCULAR: S1, S2 normal. No murmurs, rubs, or gallops.  ABDOMEN: Soft, nontender, nondistended. Bowel sounds present. No organomegaly  or mass.  EXTREMITIES: No clubbing cyanosis edema  NEUROLOGIC: Cranial nerves II through XII are intact. Muscle strength 5/5 in all extremities. Sensation intact. Gait not checked.  PSYCHIATRIC: The patient is alert and oriented x 3.  SKIN: No obvious rash, lesion, or ulcer.   Data Review     CBC w Diff: Lab Results  Component Value Date   WBC 4.6 10/06/2015   WBC 10.1 07/17/2014   HGB 8.6* 10/06/2015   HGB  11.5* 07/17/2014   HCT 25.7* 10/06/2015   HCT 35.2 07/17/2014   PLT 159 10/06/2015   PLT 316 07/17/2014   LYMPHOPCT 20 08/30/2015   LYMPHOPCT 29.3 07/17/2014   MONOPCT 16 08/30/2015   MONOPCT 5.5 07/17/2014   EOSPCT 5 08/30/2015   EOSPCT 1.2 07/17/2014   BASOPCT 1 08/30/2015   BASOPCT 0.7 07/17/2014   CMP: Lab Results  Component Value Date   NA 137 10/06/2015   NA 136 07/17/2014   K 3.4* 10/06/2015   K 3.5 07/17/2014   CL 100* 10/06/2015   CL 103 07/17/2014   CO2 33* 10/06/2015   CO2 26 07/17/2014   BUN 18 10/06/2015   BUN 32* 07/17/2014   CREATININE 3.15* 10/06/2015   CREATININE 3.45* 07/17/2014   PROT 5.4* 08/23/2015   PROT 5.7* 07/16/2014   ALBUMIN 1.5* 08/23/2015   ALBUMIN 2.6* 07/16/2014   BILITOT 0.6 08/23/2015   BILITOT 0.4 07/16/2014   ALKPHOS 105 08/23/2015   ALKPHOS 104 07/16/2014   AST 13* 08/23/2015   AST 14* 07/16/2014   ALT 7* 08/23/2015   ALT 9* 07/16/2014  .  Micro Results No results found for this or any previous visit (from the past 240 hour(s)).      Code Status Orders        Start     Ordered   10/06/15 0209  Full code   Continuous     10/06/15 0208    Code Status History    Date Active Date Inactive Code Status Order ID Comments User Context   09/13/2015  2:14 AM 09/15/2015  5:28 PM Full Code [id number]  Arnaldo Natal, MD Inpatient   08/24/2015  3:50 AM 08/31/2015  6:47 PM Full Code [id number]  Ihor Austin, MD Inpatient   07/16/2015  6:26 AM 08/02/2015  6:48 PM Full Code [id number]  Ihor Austin, MD ED   07/10/2015  1:49 AM 07/12/2015  1:01 PM Full Code [id number]  Oralia Manis, MD Inpatient   05/12/2015  3:46 PM 05/16/2015  5:35 PM Full Code [id number]  Auburn Bilberry, MD ED          Follow-up Information    Follow up with Laurier Nancy, MD. Go on 10/09/2015.   Specialty:  Cardiology   Why:  Time:10:00 a.m.   Contact information:   [address] [place] [address] [phone]       Follow up with DALE, Jordan Hawks, MD In 7  days.   Specialty:  Internal Medicine   Contact information:   [address] [place] [address] [phone]       Discharge Medications     Medication List    TAKE these medications        acetaminophen 325 MG tablet  Commonly known as:  TYLENOL  Take 650 mg by mouth every 6 (six) hours as needed for mild pain.     aspirin EC 81 MG tablet  Take 81 mg by mouth daily.     bisacodyl 10 MG suppository  Commonly known as:  DULCOLAX  Place 10 mg rectally daily as needed for moderate constipation. Use if no relief from Milk of Magnesia     calcitRIOL 0.25 MCG capsule  Commonly known as:  ROCALTROL  Take 0.25 mcg by mouth daily.     calcium acetate 667 MG capsule  Commonly known as:  PHOSLO  Take 3 capsules (2,001 mg total) by mouth 3 (three) times daily with meals.     calcium carbonate 500 MG chewable tablet  Commonly known as:  TUMS - dosed in mg elemental calcium  Chew 1 tablet (200 mg of elemental calcium total) by mouth 3 (three) times daily with meals.     clopidogrel 75 MG tablet  Commonly known as:  PLAVIX  Take 75 mg by mouth daily.     divalproex 500 MG DR tablet  Commonly known as:  DEPAKOTE  Take 1,000 mg by mouth 3 (three) times daily.     docusate sodium 100 MG capsule  Commonly known as:  COLACE  Take 100 mg by mouth at bedtime.     erythromycin 250 MG tablet  Commonly known as:  E-MYCIN  Take 250 mg by mouth 4 (four) times daily.     insulin aspart 100 UNIT/ML injection  Commonly known as:  novoLOG  Inject 0-9 Units into the skin 3 (three) times daily with meals. 15 units three times a day with meals, 5 units with snacks     insulin glargine 100 UNIT/ML injection  Commonly known as:  LANTUS  Inject 0.08 mLs (8 Units total) into the skin at bedtime.     isosorbide mononitrate 60 MG 24 hr tablet  Commonly known as:  IMDUR  Take 1 tablet (60 mg total) by mouth daily.     lactulose 10 GM/15ML solution  Commonly known as:  CHRONULAC   Take 10 g by mouth daily as needed for mild constipation.     levETIRAcetam 500 MG tablet  Commonly known as:  KEPPRA  Take 1 tablet (500 mg total) by mouth every morning.     lisinopril 40 MG tablet  Commonly known as:  PRINIVIL,ZESTRIL  Take 40 mg by mouth daily.     metoCLOPramide 10 MG tablet  Commonly known as:  REGLAN  Take 1 tablet (10 mg total) by mouth 4 (four) times daily.     metolazone 5 MG tablet  Commonly known as:  ZAROXOLYN  Take 5 mg by mouth daily as needed (fluid).     metoprolol 50 MG tablet  Commonly known as:  LOPRESSOR  Take 1 tablet (50 mg total) by mouth 2 (two) times daily.     nicotine 14 mg/24hr patch  Commonly known as:  NICODERM CQ - dosed in mg/24 hours  Place 14 mg onto the skin daily.     nitroGLYCERIN 0.4 MG SL tablet  Commonly known as:  NITROSTAT  Place 0.4 mg under the tongue every 5 (five) minutes as needed for chest pain. Reported on 07/10/2015     nystatin powder  Commonly known as:  MYCOSTATIN/NYSTOP  Apply 1 g topically 2 (two) times daily.     omeprazole 20 MG capsule  Commonly known as:  PRILOSEC  Take 40 mg by mouth 2 (two) times daily before a meal.     ondansetron 4 MG disintegrating tablet  Commonly known as:  ZOFRAN ODT  Take 1 tablet (4 mg total) by mouth every 6 (six) hours as needed for nausea or vomiting.  oxyCODONE 5 MG immediate release tablet  Commonly known as:  Oxy IR/ROXICODONE  Take 1 tablet (5 mg total) by mouth every 6 (six) hours as needed for severe pain.     polyethylene glycol packet  Commonly known as:  MIRALAX / GLYCOLAX  Take 17 g by mouth daily.     ranolazine 500 MG 12 hr tablet  Commonly known as:  RANEXA  Take 1 tablet (500 mg total) by mouth 2 (two) times daily.     risperiDONE 1 MG tablet  Commonly known as:  RISPERDAL  Take 1 mg by mouth 3 (three) times daily.     senna 8.6 MG Tabs tablet  Commonly known as:  SENOKOT  Take 2 tablets by mouth at bedtime as needed for mild  constipation.     senna 8.6 MG Tabs tablet  Commonly known as:  SENOKOT  Take 2 tablets by mouth 2 (two) times daily.     torsemide 100 MG tablet  Commonly known as:  DEMADEX  Take 100 mg by mouth daily.     XEROFORM PETROLATUM DRESSING Pads  Apply 1 each topically every 3 (three) days.           Total Time in preparing paper work, data evaluation and todays exam - 35 minutes  Auburn Bilberry M.D on 10/06/2015 at 1:06 PM  [facility]   Office  [phone]

## 2015-10-06 NOTE — Clinical Social Work Note (Signed)
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  Yes Need for family participation in patient care:  No (Coment)  Care giving concerns:  Patient was recently discharged from [facility] and has been at [facility] for [place].    Social Worker assessment / plan:  CSW met with patient. Patient reports that she remembers CSW. She reports that she'd like to return to [facility] at discharge. Stated that she did not want to loose her room because she didn't do a bed hold. Informed CSW that she's enjoyed her time at [facility]. Granted CSW verbal permission to coordinate discharge with [facility] and to inform her daughter that she would be discharging today. No FL2 is needed at this time due to patient recently discharging from [facility].   Clinical Social Worker was informed that patient will be medically ready to discharge to [facility]. Patient in a agreement with plan. CSW called [place]- Admissions Coordinator at [facility] to confirm that patient's bed is ready. Provided patient's room number 1B and number to call for report [phone] . All discharge information faxed to [facility] via [facility]. Call to patient's daughter- Nira Conn to inform  her patient would discharge to [facility]. RN will call report and patient will discharge to [facility] via EMS    Employment status:  Retired Nurse, adult PT Recommendations:  Not assessed at this time Information / Referral to community resources:     Patient/Family's Response to care:  Patient is in agreement to discharge back to [facility] today.   Patient/Family's Understanding of and Emotional Response to Diagnosis, Current Treatment, and Prognosis:  Patient understands why she was admitted under observation into [facility] and appreciates CSW's assistance.   Emotional Assessment Appearance:  Appears stated age Attitude/Demeanor/Rapport:   (None) Affect (typically observed):  Calm, Pleasant, Accepting Orientation:  Oriented to Self, Oriented to Place, Oriented to  Time, Oriented to Situation Alcohol / Substance use:  Not Applicable Psych involvement (Current and /or in the community):  No (Comment)  Discharge Needs  Concerns to be addressed:  Discharge Planning Concerns Readmission within the last 30 days:  No Current discharge risk:  None Barriers to Discharge:  No Barriers Identified   [place], LCSW 10/06/2015, 2:36 PM

## 2015-10-06 NOTE — Consult Note (Signed)
Kimberly Montgomery is a 50 y.o. female  [id number]  Primary Cardiologist: Adrian Blackwater Reason for Consultation: chest pain  HPI: Ms. Durante developed chest pressure last night with intermittent stabbing type pain as she was laying down getting ready to go to sleep. She had eaten barbequed ribs for dinner. She also had pain from her left elbow to her wrist. Her pain has subsided to a dull ache. She has had no shortness of breath or other associated symptoms.  The patient has a recent history of cardiac cath on 07/10/2015 with no intervention and cardiac cath on 09/14/2015 with stent placed to the mid LAD. The patient has other significant history of ESRD with hemodialysis, diabetes, hypertension, hyperlipidemia and GERD. She has been fluid overloaded this week and has had dialysis on Tuesday, Wednesday and Thursday.   Review of Systems: Positive for chest pressure, negative for shortness of breath, negative for edema, negative for diaphoresis, negative for dizziness or lightheadedness   Past Medical History  Diagnosis Date  . Renal disorder   . Hypertension   . CHF (congestive heart failure) (HCC)   . Diabetes mellitus without complication (HCC)   . Gastroparesis   . Asthma   . ESRD (end stage renal disease) (HCC)   . Endometriosis   . CAD (coronary artery disease)   . HLD (hyperlipidemia)   . GERD (gastroesophageal reflux disease)     Medications Prior to Admission  Medication Sig Dispense Refill  . acetaminophen (TYLENOL) 325 MG tablet Take 650 mg by mouth every 6 (six) hours as needed for mild pain.     [name] aspirin EC 81 MG tablet Take 81 mg by mouth daily.    . bisacodyl (DULCOLAX) 10 MG suppository Place 10 mg rectally daily as needed for moderate constipation. Use if no relief from Milk of Magnesia    . Bismuth Tribromoph-Petrolatum (XEROFORM PETROLATUM DRESSING) PADS Apply 1 each topically every 3 (three) days.    . calcitRIOL (ROCALTROL) 0.25 MCG capsule Take 0.25 mcg by mouth  daily.    . calcium acetate (PHOSLO) 667 MG capsule Take 3 capsules (2,001 mg total) by mouth 3 (three) times daily with meals. 90 capsule 0  . calcium carbonate (TUMS - DOSED IN MG ELEMENTAL CALCIUM) 500 MG chewable tablet Chew 1 tablet (200 mg of elemental calcium total) by mouth 3 (three) times daily with meals. 120 tablet 0  . clopidogrel (PLAVIX) 75 MG tablet Take 75 mg by mouth daily.    . divalproex (DEPAKOTE) 500 MG DR tablet Take 1,000 mg by mouth 3 (three) times daily.    [name] docusate sodium (COLACE) 100 MG capsule Take 100 mg by mouth at bedtime.     [name] erythromycin (E-MYCIN) 250 MG tablet Take 250 mg by mouth 4 (four) times daily.    . insulin aspart (NOVOLOG) 100 UNIT/ML injection Inject 0-9 Units into the skin 3 (three) times daily with meals. 15 units three times a day with meals, 5 units with snacks    . insulin glargine (LANTUS) 100 UNIT/ML injection Inject 0.08 mLs (8 Units total) into the skin at bedtime. 10 mL 11  . isosorbide mononitrate (IMDUR) 60 MG 24 hr tablet Take 1 tablet (60 mg total) by mouth daily. 30 tablet 00  . lactulose (CHRONULAC) 10 GM/15ML solution Take 10 g by mouth daily as needed for mild constipation.    . levETIRAcetam (KEPPRA) 500 MG tablet Take 1 tablet (500 mg total) by mouth every morning. 30 tablet 0  .  lisinopril (PRINIVIL,ZESTRIL) 40 MG tablet Take 40 mg by mouth daily.  3  . metoCLOPramide (REGLAN) 10 MG tablet Take 1 tablet (10 mg total) by mouth 4 (four) times daily. 90 tablet 0  . metolazone (ZAROXOLYN) 5 MG tablet Take 5 mg by mouth daily as needed (fluid).    . metoprolol (LOPRESSOR) 50 MG tablet Take 1 tablet (50 mg total) by mouth 2 (two) times daily.    . nicotine (NICODERM CQ - DOSED IN MG/24 HOURS) 14 mg/24hr patch Place 14 mg onto the skin daily.    . nitroGLYCERIN (NITROSTAT) 0.4 MG SL tablet Place 0.4 mg under the tongue every 5 (five) minutes as needed for chest pain. Reported on 07/10/2015    . nystatin (MYCOSTATIN) powder Apply 1 g  topically 2 (two) times daily.    [name] omeprazole (PRILOSEC) 20 MG capsule Take 40 mg by mouth 2 (two) times daily before a meal.    . ondansetron (ZOFRAN ODT) 4 MG disintegrating tablet Take 1 tablet (4 mg total) by mouth every 6 (six) hours as needed for nausea or vomiting. 20 tablet 0  . oxyCODONE (OXY IR/ROXICODONE) 5 MG immediate release tablet Take 1 tablet (5 mg total) by mouth every 6 (six) hours as needed for severe pain. 20 tablet 0  . polyethylene glycol (MIRALAX / GLYCOLAX) packet Take 17 g by mouth daily. (Patient taking differently: Take 17 g by mouth 2 (two) times daily. ) 14 each 0  . ranolazine (RANEXA) 500 MG 12 hr tablet Take 1 tablet (500 mg total) by mouth 2 (two) times daily. 60 tablet 0  . risperiDONE (RISPERDAL) 1 MG tablet Take 1 mg by mouth 3 (three) times daily.     [name] senna (SENOKOT) 8.6 MG TABS tablet Take 2 tablets by mouth at bedtime as needed for mild constipation.    . senna (SENOKOT) 8.6 MG TABS tablet Take 2 tablets by mouth 2 (two) times daily.    [name] torsemide (DEMADEX) 100 MG tablet Take 100 mg by mouth daily.       Melene Muller ON 10/07/2015] aspirin EC  81 mg Oral Daily  . calcitRIOL  0.25 mcg Oral Q breakfast  . calcium acetate  2,001 mg Oral TID WC  . clopidogrel  75 mg Oral Daily  . divalproex  1,000 mg Oral TID  . docusate sodium  100 mg Oral QHS  . erythromycin  250 mg Oral QID  . heparin  5,000 Units Subcutaneous Q8H  . insulin aspart  0-15 Units Subcutaneous TID WC  . insulin aspart  0-5 Units Subcutaneous QHS  . insulin glargine  8 Units Subcutaneous QHS  . isosorbide mononitrate  60 mg Oral Daily  . levETIRAcetam  500 mg Oral q morning - 10a  . lisinopril  40 mg Oral Daily  . metoCLOPramide  10 mg Oral QID  . metoprolol  50 mg Oral BID  . nicotine  14 mg Transdermal Daily  . pantoprazole  40 mg Oral QAC breakfast  . polyethylene glycol  17 g Oral Daily  . ranolazine  500 mg Oral BID  . risperiDONE  1 mg Oral TID  . senna  2 tablet Oral BID  .  sodium chloride flush  3 mL Intravenous Q12H  . torsemide  100 mg Oral Daily    Infusions:    Allergies  Allergen Reactions  . Cephalosporins Anaphylaxis  . Penicillins Anaphylaxis and Other (See Comments)    Has patient had a PCN reaction causing immediate  rash, facial/tongue/throat swelling, SOB or lightheadedness with hypotension: Yes Has patient had a PCN reaction causing severe rash involving mucus membranes or skin necrosis: No Has patient had a PCN reaction that required hospitalization No Has patient had a PCN reaction occurring within the last 10 years: No If all of the above answers are "NO", then may proceed with Cephalosporin use.  . Lamictal [Lamotrigine] Other (See Comments)    Reaction:  Hallucinations  . Phenergan [Promethazine Hcl] Nausea And Vomiting  . Pravastatin Other (See Comments)    Reaction:  Muscle pain   . Sulfa Antibiotics Other (See Comments)    Reaction:  Unknown     Social History   Social History  . Marital Status: Widowed    Spouse Name: N/A  . Number of Children: N/A  . Years of Education: N/A   Occupational History  . disabled    Social History Main Topics  . Smoking status: Former Smoker -- 0.50 packs/day    Types: Cigarettes  . Smokeless tobacco: Not on file  . Alcohol Use: No  . Drug Use: No  . Sexual Activity: No   Other Topics Concern  . Not on file   Social History Narrative    Family History  Problem Relation Age of Onset  . CAD    . Diabetes    . Bipolar disorder    . Cervical cancer Mother     PHYSICAL EXAM: Filed Vitals:   10/06/15 0432 10/06/15 0618  BP: 191/72 138/70  Pulse: 73   Temp: 98.1 F (36.7 C)   Resp: 20      Intake/Output Summary (Last 24 hours) at 10/06/15 9604 Last data filed at 10/06/15 0700  Gross per 24 hour  Intake      0 ml  Output      0 ml  Net      0 ml    General:  Well appearing. No respiratory difficulty HEENT: normal Neck: supple. no JVD. Carotids 2+ bilat; no bruits.  No lymphadenopathy or thryomegaly appreciated. Cor: PMI nondisplaced. Regular rate & rhythm. No rubs, gallops or murmurs. Lungs: clear Abdomen: soft, nontender, nondistended. No hepatosplenomegaly. No bruits or masses. Good bowel sounds. Extremities: no cyanosis, clubbing, rash, edema, right BKA Neuro: alert & oriented x 3, cranial nerves grossly intact. moves all 4 extremities w/o difficulty. Affect pleasant.  ECG: NSR 88 bpm, no ST depression or elevation, no acute ischemic changes  Results for orders placed or performed during the hospital encounter of 10/05/15 (from the past 24 hour(s))  Basic metabolic panel     Status: Abnormal   Collection Time: 10/05/15  9:57 PM  Result Value Ref Range   Sodium 136 135 - 145 mmol/L   Potassium 3.0 (L) 3.5 - 5.1 mmol/L   Chloride 97 (L) 101 - 111 mmol/L   CO2 34 (H) 22 - 32 mmol/L   Glucose, Bld 184 (H) 65 - 99 mg/dL   BUN 16 6 - 20 mg/dL   Creatinine, Ser 5.40 (H) 0.44 - 1.00 mg/dL   Calcium 8.3 (L) 8.9 - 10.3 mg/dL   GFR calc non Af Amer 17 (L) >60 mL/min   GFR calc Af Amer 20 (L) >60 mL/min   Anion gap 5 5 - 15  CBC     Status: Abnormal   Collection Time: 10/05/15  9:57 PM  Result Value Ref Range   WBC 4.4 3.6 - 11.0 K/uL   RBC 3.01 (L) 3.80 - 5.20 MIL/uL   Hemoglobin  9.5 (L) 12.0 - 16.0 g/dL   HCT 40.9 (L) 81.1 - 91.4 %   MCV 89.6 80.0 - 100.0 fL   MCH 31.6 26.0 - 34.0 pg   MCHC 35.2 32.0 - 36.0 g/dL   RDW 78.2 (H) 95.6 - 21.3 %   Platelets 169 150 - 440 K/uL  Troponin I     Status: Abnormal   Collection Time: 10/05/15  9:57 PM  Result Value Ref Range   Troponin I 0.03 (HH) <0.03 ng/mL  Protime-INR (order if Patient is taking Coumadin / Warfarin)     Status: None   Collection Time: 10/05/15  9:57 PM  Result Value Ref Range   Prothrombin Time 12.7 11.4 - 15.0 seconds   INR 0.93   Brain natriuretic peptide     Status: Abnormal   Collection Time: 10/05/15  9:57 PM  Result Value Ref Range   B Natriuretic Peptide 1751.0 (H) 0.0 -  100.0 pg/mL  Glucose, capillary     Status: Abnormal   Collection Time: 10/06/15  1:17 AM  Result Value Ref Range   Glucose-Capillary 162 (H) 65 - 99 mg/dL  Troponin I     Status: Abnormal   Collection Time: 10/06/15  4:15 AM  Result Value Ref Range   Troponin I 0.05 (HH) <0.03 ng/mL  Basic metabolic panel     Status: Abnormal   Collection Time: 10/06/15  4:15 AM  Result Value Ref Range   Sodium 137 135 - 145 mmol/L   Potassium 3.4 (L) 3.5 - 5.1 mmol/L   Chloride 100 (L) 101 - 111 mmol/L   CO2 33 (H) 22 - 32 mmol/L   Glucose, Bld 199 (H) 65 - 99 mg/dL   BUN 18 6 - 20 mg/dL   Creatinine, Ser 0.86 (H) 0.44 - 1.00 mg/dL   Calcium 8.1 (L) 8.9 - 10.3 mg/dL   GFR calc non Af Amer 16 (L) >60 mL/min   GFR calc Af Amer 18 (L) >60 mL/min   Anion gap 4 (L) 5 - 15  CBC     Status: Abnormal   Collection Time: 10/06/15  4:15 AM  Result Value Ref Range   WBC 4.6 3.6 - 11.0 K/uL   RBC 2.83 (L) 3.80 - 5.20 MIL/uL   Hemoglobin 8.6 (L) 12.0 - 16.0 g/dL   HCT 57.8 (L) 46.9 - 62.9 %   MCV 90.8 80.0 - 100.0 fL   MCH 30.5 26.0 - 34.0 pg   MCHC 33.5 32.0 - 36.0 g/dL   RDW 52.8 (H) 41.3 - 24.4 %   Platelets 159 150 - 440 K/uL  Lipid panel     Status: Abnormal   Collection Time: 10/06/15  4:15 AM  Result Value Ref Range   Cholesterol 252 (H) 0 - 200 mg/dL   Triglycerides 010 (H) <150 mg/dL   HDL 69 >27 mg/dL   Total CHOL/HDL Ratio 3.7 RATIO   VLDL 41 (H) 0 - 40 mg/dL   LDL Cholesterol 253 (H) 0 - 99 mg/dL  Glucose, capillary     Status: Abnormal   Collection Time: 10/06/15  7:26 AM  Result Value Ref Range   Glucose-Capillary 167 (H) 65 - 99 mg/dL   Comment 1 Notify RN    Comment 2 Document in Chart    Dg Chest 2 View  10/05/2015  CLINICAL DATA:  Chest pain today.  Pain radiates to the left arm. EXAM: CHEST  2 VIEW COMPARISON:  09/12/2014 FINDINGS: Right-sided dialysis catheter remains in place.  The cardiomediastinal contours are unchanged, heart at the upper limits normal in size. No  pulmonary edema. Blunting of the left costophrenic angles chronic. No consolidation or pneumothorax. No acute osseous abnormalities are seen. IMPRESSION: No acute process. Electronically Signed   By: Rubye Oaks M.D.   On: 10/05/2015 22:23     ASSESSMENT AND PLAN: Stable angina versus GERD. Onset of chest pressure last night, improved after admission. Recent history of stent to mid LAD on 09/15/2015 and treated with Plavix, aspirin, isosorbide, ranolazine, metoprolol and lisinopril. She is at a rehabilitation facility who administers her medications. First troponin was negative, second is mildy elevated at 0.05 which is likely related to fluid overload and ESRD. Will increase ranolazine and switch omeprazole to pantoprazole twice daily. If third troponin is OK, can discharge back to facility. Will see patient in office on Monday for potential outpatient testing.  Berton Bon, NP 10/06/2015 9:22 AM

## 2015-10-06 NOTE — Care Management Obs Status (Signed)
MEDICARE OBSERVATION STATUS NOTIFICATION   Patient Details  Name: Kimberly Montgomery MRN: [id number] Date of Birth: 30-Jul-1965   Medicare Observation Status Notification Given:  Yes    Eber Hong, RN 10/06/2015, 9:56 AM

## 2015-10-06 NOTE — Progress Notes (Signed)
[facility]  ROUNDING NOTE   Subjective:  Patient well-known to [place]. She was recently admitted for coronary artery disease with LAD stent placed. She now presents withchest pain after dialysis. She had dialysis yesterday without issue.   Objective:  Vital signs in last 24 hours:  Temp:  [98.1 F (36.7 C)-98.4 F (36.9 C)] 98.2 F (36.8 C) (07/07 1115) Pulse Rate:  [62-93] 62 (07/07 1517) Resp:  [14-23] 18 (07/07 1115) BP: (138-191)/(56-85) 142/56 mmHg (07/07 1517) SpO2:  [93 %-100 %] 100 % (07/07 1115) Weight:  [90.22 kg (198 lb 14.4 oz)] 90.22 kg (198 lb 14.4 oz) (07/07 0212)  Weight change:  Filed Weights   10/06/15 0212  Weight: 90.22 kg (198 lb 14.4 oz)    Intake/Output:     Intake/Output this shift:  Total I/O In: 480 [P.O.:480] Out: 0   Physical Exam: General: NAD, resting in bed  Head: Normocephalic, atraumatic. Moist oral mucosal membranes  Eyes: Anicteric  Neck: Supple, trachea midline  Lungs:  Clear to auscultation normal effort  Heart: S1S2 no rubs  Abdomen:  Soft, nontender, BS present   Extremities: No peripheral edema, RLE amputation  Neurologic: Nonfocal, moving all four extremities  Skin: No lesions  Access: R IJ Permcath    Basic Metabolic Panel:  Recent Labs Lab 10/05/15 2157 10/06/15 0415  NA 136 137  K 3.0* 3.4*  CL 97* 100*  CO2 34* 33*  GLUCOSE 184* 199*  BUN 16 18  CREATININE 2.96* 3.15*  CALCIUM 8.3* 8.1*    Liver Function Tests: No results for input(s): AST, ALT, ALKPHOS, BILITOT, PROT, ALBUMIN in the last 168 hours. No results for input(s): LIPASE, AMYLASE in the last 168 hours. No results for input(s): AMMONIA in the last 168 hours.  CBC:  Recent Labs Lab 10/05/15 2157 10/06/15 0415  WBC 4.4 4.6  HGB 9.5* 8.6*  HCT 27.0* 25.7*  MCV 89.6 90.8  PLT 169 159    Cardiac Enzymes:  Recent Labs Lab 10/05/15 2157 10/06/15 0415 10/06/15 0955  TROPONINI 0.03* 0.05* 0.03*    BNP: Invalid  input(s): POCBNP  CBG:  Recent Labs Lab 10/06/15 0117 10/06/15 0726 10/06/15 1116  GLUCAP 162* 167* 145*    Microbiology: Results for orders placed or performed during the hospital encounter of 09/12/15  MRSA PCR Screening     Status: None   Collection Time: 09/13/15  6:08 AM  Result Value Ref Range Status   MRSA by PCR NEGATIVE NEGATIVE Final    Comment:        The GeneXpert MRSA Assay (FDA approved for NASAL specimens only), is one component of a comprehensive MRSA colonization surveillance program. It is not intended to diagnose MRSA infection nor to guide or monitor treatment for MRSA infections.     Coagulation Studies:  Recent Labs  10/05/15 2157  LABPROT 12.7  INR 0.93    Urinalysis: No results for input(s): COLORURINE, LABSPEC, PHURINE, GLUCOSEU, HGBUR, BILIRUBINUR, KETONESUR, PROTEINUR, UROBILINOGEN, NITRITE, LEUKOCYTESUR in the last 72 hours.  Invalid input(s): APPERANCEUR    Imaging: Dg Chest 2 View  10/05/2015  CLINICAL DATA:  Chest pain today.  Pain radiates to the left arm. EXAM: CHEST  2 VIEW COMPARISON:  09/12/2014 FINDINGS: Right-sided dialysis catheter remains in place. The cardiomediastinal contours are unchanged, heart at the upper limits normal in size. No pulmonary edema. Blunting of the left costophrenic angles chronic. No consolidation or pneumothorax. No acute osseous abnormalities are seen. IMPRESSION: No acute process. Electronically Signed  By: Rubye Oaks M.D.   On: 10/05/2015 22:23     Medications:     . [START ON 10/07/2015] aspirin EC  81 mg Oral Daily  . calcitRIOL  0.25 mcg Oral Q breakfast  . calcium acetate  2,001 mg Oral TID WC  . clopidogrel  75 mg Oral Daily  . divalproex  1,000 mg Oral TID  . docusate sodium  100 mg Oral QHS  . erythromycin  250 mg Oral QID  . heparin  5,000 Units Subcutaneous Q8H  . insulin aspart  0-15 Units Subcutaneous TID WC  . insulin aspart  0-5 Units Subcutaneous QHS  . insulin  glargine  8 Units Subcutaneous QHS  . isosorbide mononitrate  60 mg Oral Daily  . levETIRAcetam  500 mg Oral q morning - 10a  . lisinopril  40 mg Oral Daily  . metoCLOPramide  10 mg Oral QID  . metoprolol  50 mg Oral BID  . nicotine  14 mg Transdermal Daily  . pantoprazole  40 mg Oral BID  . polyethylene glycol  17 g Oral Daily  . ranolazine  1,000 mg Oral BID  . risperiDONE  1 mg Oral TID  . senna  2 tablet Oral BID  . sodium chloride flush  3 mL Intravenous Q12H  . torsemide  100 mg Oral Daily   sodium chloride, acetaminophen, hydrALAZINE, lactulose, metolazone, nitroGLYCERIN, nitroGLYCERIN, ondansetron, sodium chloride flush  Assessment/ Plan:  50 y.o. female with past medical history of end-stage renal disease on peritoneal dialysis, asthma, gastroparesis, diabetes mellitus, congestive heart failure, hypertension, endometriosis, coronary atherosclerosis/medical management, PD peritonitis with micrococcus, mid LAD stent placement 6/17, readmission for chest pain  [facility]/[address]/peritoneal dialysis  1. End-stage renal disease on hemodialysis dialysis, TTHS: Patient had hemodialysis yesterday.  Actually appears to be a bit volume depleted at the moment.  No indication for dialysis today.  We will plan for hemodialysis again tomorrow.  2. Anemia of CKD:  - hemoglobin currently 8.6.  We will consider starting Epogen with dialysis tomorrow.  3. Hypertension:  -lood pressure currently under reasonable control.  Continue lisinopril and metoprolol at the moment.  4. Secondary Hyperparathyroidism: Discontinue calcitriol for low PTH.  Continue calcium acetate.  Followup PTH and phosphorus tomorrow.  5. Chest pain.  Patient with recent mid LAD stent in June of 2017.  He now presents with chest pain. Workup and management per cardiology.   LOS:  Brant Peets 7/7/20173:24 PM

## 2015-10-06 NOTE — H&P (Signed)
[facility] - [place] [facility]   PATIENT NAME: Kimberly Montgomery    MR#:  [id number]  DATE OF BIRTH:  [birth year]  DATE OF ADMISSION:  10/05/2015  PRIMARY CARE PHYSICIAN: Jarome Matin, MD   REQUESTING/REFERRING PHYSICIAN:   CHIEF COMPLAINT:   Chief Complaint  Patient presents with  . Chest Pain    HISTORY OF PRESENT ILLNESS: Kimberly Montgomery  is a 50 y.o. female with a known history of End-stage renal disease on dialysis, hypertension, congestive heart failure, diabetes mellitus type 2 insulin-dependent, bronchial asthma, coronary artery disease, cardiac stent presented to the emergency room with chest pain. Chest pain started at 8 PM last night and located in the left side of the chest. The pain is sharp in nature 6 out of 10 on a scale of 1-10. And had cardiac stent placed 3 weeks ago and follows up with cardiology outpatient basis. Patient is compliant with medications. No history of any shortness of breath. No history of orthopnea or proximal nocturnal dyspnea.First set of troponin is negative.EKG normal sinus rhythm no ST segment elevation or depression. Patient also complains of pain in the back of the leg. Patient has a right below-knee amputation and currently is a resident of a rehabilitation facility.  PAST MEDICAL HISTORY:   Past Medical History  Diagnosis Date  . Renal disorder   . Hypertension   . CHF (congestive heart failure) (HCC)   . Diabetes mellitus without complication (HCC)   . Gastroparesis   . Asthma   . ESRD (end stage renal disease) (HCC)   . Endometriosis   . CAD (coronary artery disease)   . HLD (hyperlipidemia)   . GERD (gastroesophageal reflux disease)     PAST SURGICAL HISTORY: Past Surgical History  Procedure Laterality Date  . Hysterotomy    . Below knee leg amputation    . Pd tube inserstion    . Hd fistula surgery with reversal    . Cardiac catheterization Right 07/10/2015    Procedure: Left Heart Cath and Coronary  Angiography;  Surgeon: Laurier Nancy, MD;  Location: [facility] INVASIVE CV LAB;  Service: Cardiovascular;  Laterality: Right;  . Peripheral vascular catheterization N/A 08/30/2015    Procedure: Dialysis/Perma Catheter Insertion;  Surgeon: Annice Needy, MD;  Location: [facility] INVASIVE CV LAB;  Service: Cardiovascular;  Laterality: N/A;  . Cardiac catheterization Right 09/14/2015    Procedure: Left Heart Cath and Coronary Angiography;  Surgeon: Laurier Nancy, MD;  Location: [facility] INVASIVE CV LAB;  Service: Cardiovascular;  Laterality: Right;  . Cardiac catheterization N/A 09/14/2015    Procedure: Coronary Stent Intervention;  Surgeon: Alwyn Pea, MD;  Location: [facility] INVASIVE CV LAB;  Service: Cardiovascular;  Laterality: N/A;    SOCIAL HISTORY:  Social History  Substance Use Topics  . Smoking status: Former Smoker -- 0.50 packs/day    Types: Cigarettes  . Smokeless tobacco: Not on file  . Alcohol Use: No    FAMILY HISTORY:  Family History  Problem Relation Age of Onset  . CAD    . Diabetes    . Bipolar disorder    . Cervical cancer Mother     DRUG ALLERGIES:  Allergies  Allergen Reactions  . Cephalosporins Anaphylaxis  . Penicillins Anaphylaxis and Other (See Comments)    Has patient had a PCN reaction causing immediate rash, facial/tongue/throat swelling, SOB or lightheadedness with hypotension: Yes Has patient had a PCN reaction causing severe rash involving mucus membranes or skin necrosis: No  Has patient had a PCN reaction that required hospitalization No Has patient had a PCN reaction occurring within the last 10 years: No If all of the above answers are "NO", then may proceed with Cephalosporin use.  . Lamictal [Lamotrigine] Other (See Comments)    Reaction:  Hallucinations  . Phenergan [Promethazine Hcl] Nausea And Vomiting  . Pravastatin Other (See Comments)    Reaction:  Muscle pain   . Sulfa Antibiotics Other (See Comments)    Reaction:  Unknown     REVIEW OF  SYSTEMS:   CONSTITUTIONAL: No fever, fatigue or weakness.  EYES: No blurred or double vision.  EARS, NOSE, AND THROAT: No tinnitus or ear pain.  RESPIRATORY: No cough, shortness of breath, wheezing or hemoptysis.  CARDIOVASCULAR: Has chest pain, no orthopnea, edema.  GASTROINTESTINAL: No nausea, vomiting, diarrhea or abdominal pain.  GENITOURINARY: No dysuria, hematuria.  ENDOCRINE: No polyuria, nocturia,  HEMATOLOGY: No anemia, easy bruising or bleeding SKIN: No rash or lesion. MUSCULOSKELETAL: No joint pain or arthritis.  Has leg pain. Has right BKA NEUROLOGIC: No tingling, numbness, weakness.  PSYCHIATRY: No anxiety or depression.   MEDICATIONS AT HOME:  Prior to Admission medications   Medication Sig Start Date End Date Taking? Authorizing Provider  acetaminophen (TYLENOL) 325 MG tablet Take 650 mg by mouth every 6 (six) hours as needed for mild pain.    Yes Historical Provider, MD  aspirin EC 81 MG tablet Take 81 mg by mouth daily.   Yes Historical Provider, MD  bisacodyl (DULCOLAX) 10 MG suppository Place 10 mg rectally daily as needed for moderate constipation. Use if no relief from Milk of Magnesia   Yes Historical Provider, MD  Bismuth Tribromoph-Petrolatum (XEROFORM PETROLATUM DRESSING) PADS Apply 1 each topically every 3 (three) days.   Yes Historical Provider, MD  calcitRIOL (ROCALTROL) 0.25 MCG capsule Take 0.25 mcg by mouth daily.   Yes Historical Provider, MD  calcium acetate (PHOSLO) 667 MG capsule Take 3 capsules (2,001 mg total) by mouth 3 (three) times daily with meals. 07/22/15  Yes Enedina Finner, MD  calcium carbonate (TUMS - DOSED IN MG ELEMENTAL CALCIUM) 500 MG chewable tablet Chew 1 tablet (200 mg of elemental calcium total) by mouth 3 (three) times daily with meals. 08/02/15  Yes Adrian Saran, MD  clopidogrel (PLAVIX) 75 MG tablet Take 75 mg by mouth daily.   Yes Historical Provider, MD  divalproex (DEPAKOTE) 500 MG DR tablet Take 1,000 mg by mouth 3 (three) times daily.    Yes Historical Provider, MD  docusate sodium (COLACE) 100 MG capsule Take 100 mg by mouth at bedtime.    Yes Historical Provider, MD  erythromycin (E-MYCIN) 250 MG tablet Take 250 mg by mouth 4 (four) times daily.   Yes Historical Provider, MD  insulin aspart (NOVOLOG) 100 UNIT/ML injection Inject 0-9 Units into the skin 3 (three) times daily with meals. 15 units three times a day with meals, 5 units with snacks   Yes Historical Provider, MD  insulin glargine (LANTUS) 100 UNIT/ML injection Inject 0.08 mLs (8 Units total) into the skin at bedtime. 08/30/15  Yes Srikar Sudini, MD  isosorbide mononitrate (IMDUR) 60 MG 24 hr tablet Take 1 tablet (60 mg total) by mouth daily. 07/11/15  Yes Auburn Bilberry, MD  lactulose (CHRONULAC) 10 GM/15ML solution Take 10 g by mouth daily as needed for mild constipation.   Yes Historical Provider, MD  levETIRAcetam (KEPPRA) 500 MG tablet Take 1 tablet (500 mg total) by mouth every morning. 08/02/15  Yes Adrian Saran, MD  lisinopril (PRINIVIL,ZESTRIL) 40 MG tablet Take 40 mg by mouth daily. 06/26/15  Yes Historical Provider, MD  metoCLOPramide (REGLAN) 10 MG tablet Take 1 tablet (10 mg total) by mouth 4 (four) times daily. 08/02/15  Yes Adrian Saran, MD  metolazone (ZAROXOLYN) 5 MG tablet Take 5 mg by mouth daily as needed (fluid).   Yes Historical Provider, MD  metoprolol (LOPRESSOR) 50 MG tablet Take 1 tablet (50 mg total) by mouth 2 (two) times daily. 09/15/15  Yes Srikar Sudini, MD  nicotine (NICODERM CQ - DOSED IN MG/24 HOURS) 14 mg/24hr patch Place 14 mg onto the skin daily.   Yes Historical Provider, MD  nitroGLYCERIN (NITROSTAT) 0.4 MG SL tablet Place 0.4 mg under the tongue every 5 (five) minutes as needed for chest pain. Reported on 07/10/2015   Yes Historical Provider, MD  nystatin (MYCOSTATIN) powder Apply 1 g topically 2 (two) times daily.   Yes Historical Provider, MD  omeprazole (PRILOSEC) 20 MG capsule Take 40 mg by mouth 2 (two) times daily before a meal.   Yes  Historical Provider, MD  ondansetron (ZOFRAN ODT) 4 MG disintegrating tablet Take 1 tablet (4 mg total) by mouth every 6 (six) hours as needed for nausea or vomiting. 07/16/15  Yes Sharyn Creamer, MD  oxyCODONE (OXY IR/ROXICODONE) 5 MG immediate release tablet Take 1 tablet (5 mg total) by mouth every 6 (six) hours as needed for severe pain. 09/15/15  Yes Srikar Sudini, MD  polyethylene glycol (MIRALAX / GLYCOLAX) packet Take 17 g by mouth daily. Patient taking differently: Take 17 g by mouth 2 (two) times daily.  08/02/15  Yes Adrian Saran, MD  ranolazine (RANEXA) 500 MG 12 hr tablet Take 1 tablet (500 mg total) by mouth 2 (two) times daily. 07/11/15  Yes Auburn Bilberry, MD  risperiDONE (RISPERDAL) 1 MG tablet Take 1 mg by mouth 3 (three) times daily.    Yes Historical Provider, MD  senna (SENOKOT) 8.6 MG TABS tablet Take 2 tablets by mouth at bedtime as needed for mild constipation.   Yes Historical Provider, MD  senna (SENOKOT) 8.6 MG TABS tablet Take 2 tablets by mouth 2 (two) times daily.   Yes Historical Provider, MD  torsemide (DEMADEX) 100 MG tablet Take 100 mg by mouth daily.   Yes Historical Provider, MD      PHYSICAL EXAMINATION:   VITAL SIGNS: Blood pressure 182/82, pulse 80, temperature 98.4 F (36.9 C), temperature source Oral, resp. rate 20, last menstrual period 03/16/2015, SpO2 100 %.  GENERAL:  50 y.o.-year-old patient lying in the bed with no acute distress.  EYES: Pupils equal, round, reactive to light and accommodation. No scleral icterus. Extraocular muscles intact.  HEENT: Head atraumatic, normocephalic. Oropharynx and nasopharynx clear.  NECK:  Supple, no jugular venous distention. No thyroid enlargement, no tenderness.  LUNGS: Normal breath sounds bilaterally, no wheezing, rales,rhonchi or crepitation. No use of accessory muscles of respiration.  CARDIOVASCULAR: S1, S2 normal. No murmurs, rubs, or gallops.  ABDOMEN: Soft, nontender, nondistended. Bowel sounds present. No  organomegaly or mass.  EXTREMITIES: No pedal edema, cyanosis, or clubbing. Right BKA noted. NEUROLOGIC: Cranial nerves II through XII are intact. Muscle strength 5/5 upper and left lower extremity. Sensation intact. Gait not checked.  PSYCHIATRIC: The patient is alert and oriented x 3.  SKIN: No obvious rash, lesion, or ulcer.   LABORATORY PANEL:   CBC  Recent Labs Lab 10/05/15 2157  WBC 4.4  HGB 9.5*  HCT 27.0*  PLT  169  MCV 89.6  MCH 31.6  MCHC 35.2  RDW 20.9*   ------------------------------------------------------------------------------------------------------------------  Chemistries   Recent Labs Lab 10/05/15 2157  NA 136  K 3.0*  CL 97*  CO2 34*  GLUCOSE 184*  BUN 16  CREATININE 2.96*  CALCIUM 8.3*   ------------------------------------------------------------------------------------------------------------------ CrCl cannot be calculated (Unknown ideal weight.). ------------------------------------------------------------------------------------------------------------------ No results for input(s): TSH, T4TOTAL, T3FREE, THYROIDAB in the last 72 hours.  Invalid input(s): FREET3   Coagulation profile  Recent Labs Lab 10/05/15 2157  INR 0.93   ------------------------------------------------------------------------------------------------------------------- No results for input(s): DDIMER in the last 72 hours. -------------------------------------------------------------------------------------------------------------------  Cardiac Enzymes  Recent Labs Lab 10/05/15 2157  TROPONINI 0.03*   ------------------------------------------------------------------------------------------------------------------ Invalid input(s): POCBNP  ---------------------------------------------------------------------------------------------------------------  Urinalysis    Component Value Date/Time   COLORURINE YELLOW* 07/15/2015 2223   COLORURINE Straw  12/31/2013 1818   APPEARANCEUR CLEAR* 07/15/2015 2223   APPEARANCEUR Clear 12/31/2013 1818   LABSPEC 1.015 07/15/2015 2223   LABSPEC 1.009 12/31/2013 1818   PHURINE 6.0 07/15/2015 2223   PHURINE 6.0 12/31/2013 1818   GLUCOSEU >500* 07/15/2015 2223   GLUCOSEU 150 mg/dL 16/12/9602 5409   HGBUR 1+* 07/15/2015 2223   HGBUR Negative 12/31/2013 1818   BILIRUBINUR NEGATIVE 07/15/2015 2223   BILIRUBINUR Negative 12/31/2013 1818   KETONESUR TRACE* 07/15/2015 2223   KETONESUR Negative 12/31/2013 1818   PROTEINUR >500* 07/15/2015 2223   PROTEINUR 100 mg/dL 81/19/1478 2956   NITRITE NEGATIVE 07/15/2015 2223   NITRITE Negative 12/31/2013 1818   LEUKOCYTESUR NEGATIVE 07/15/2015 2223   LEUKOCYTESUR Negative 12/31/2013 1818     RADIOLOGY: Dg Chest 2 View  10/05/2015  CLINICAL DATA:  Chest pain today.  Pain radiates to the left arm. EXAM: CHEST  2 VIEW COMPARISON:  09/12/2014 FINDINGS: Right-sided dialysis catheter remains in place. The cardiomediastinal contours are unchanged, heart at the upper limits normal in size. No pulmonary edema. Blunting of the left costophrenic angles chronic. No consolidation or pneumothorax. No acute osseous abnormalities are seen. IMPRESSION: No acute process. Electronically Signed   By: Rubye Oaks M.D.   On: 10/05/2015 22:23    EKG: Orders placed or performed during the hospital encounter of 10/05/15  . EKG 12-Lead  . EKG 12-Lead  . ED EKG within 10 minutes  . ED EKG within 10 minutes    IMPRESSION AND PLAN: 50 year old female patient with history of end-stage renal disease on dialysis, hypertension, diabetes mellitus, coronary artery disease, cardiac stent presented to the emergency room with chest pain. Admitting diagnosis 1. Unstable angina 2. Coronary artery disease with cardiac stent 3. Type 2 diabetes mellitus insulin-dependent 4. Hypertension 5. Mild hypokalemia Treatment plan Admit patient to telemetry observation bed Cardiology  consultation Cycle troponin to rule out ischemia Check echocardiogram Resume beta blocker, ACE inhibitor and nitrates for chest pain Resume aspirin and Plavix at home dose DVT prophylaxis subcutaneous heparin Supportive care.  All the records are reviewed and case discussed with ED provider. Management plans discussed with the patient, family and they are in agreement.  CODE STATUS:FULL Code Status History    Date Active Date Inactive Code Status Order ID Comments User Context   09/13/2015  2:14 AM 09/15/2015  5:28 PM Full Code [id number]  Arnaldo Natal, MD Inpatient   08/24/2015  3:50 AM 08/31/2015  6:47 PM Full Code [id number]  Ihor Austin, MD Inpatient   07/16/2015  6:26 AM 08/02/2015  6:48 PM Full Code [id number]  Ihor Austin, MD ED   07/10/2015  1:49 AM 07/12/2015  1:01  PM Full Code [id number]  Oralia Manis, MD Inpatient   05/12/2015  3:46 PM 05/16/2015  5:35 PM Full Code [id number]  Auburn Bilberry, MD ED       TOTAL TIME TAKING CARE OF THIS PATIENT: 51 minutes.    Ihor Austin M.D on 10/06/2015 at 1:00 AM  Between 7am to 6pm - Pager - [phone]  After 6pm go to [email or web address] - password [email or web address] [facility]  [facility]  Office  [phone]  CC: Primary care physician; Jarome Matin, MD

## 2015-10-06 NOTE — Progress Notes (Signed)
EMS arrived and patient is being transport back to [facility]. Pt has all belongings bagged and with her.

## 2015-11-18 ENCOUNTER — Emergency Department: Payer: Medicare HMO

## 2015-11-18 ENCOUNTER — Inpatient Hospital Stay
Admission: EM | Admit: 2015-11-18 | Discharge: 2015-11-21 | DRG: 280 | Disposition: A | Discharge status: Skilled Nursing Facility | Payer: Medicare HMO | Attending: Internal Medicine | Admitting: Internal Medicine

## 2015-11-18 DIAGNOSIS — I10 Essential (primary) hypertension: Secondary | ICD-10-CM

## 2015-11-18 DIAGNOSIS — I214 Non-ST elevation (NSTEMI) myocardial infarction: Secondary | ICD-10-CM | POA: Diagnosis not present

## 2015-11-18 DIAGNOSIS — Z794 Long term (current) use of insulin: Secondary | ICD-10-CM

## 2015-11-18 DIAGNOSIS — I5031 Acute diastolic (congestive) heart failure: Secondary | ICD-10-CM | POA: Diagnosis present

## 2015-11-18 DIAGNOSIS — Z955 Presence of coronary angioplasty implant and graft: Secondary | ICD-10-CM

## 2015-11-18 DIAGNOSIS — Z888 Allergy status to other drugs, medicaments and biological substances status: Secondary | ICD-10-CM

## 2015-11-18 DIAGNOSIS — J9601 Acute respiratory failure with hypoxia: Secondary | ICD-10-CM

## 2015-11-18 DIAGNOSIS — Z22322 Carrier or suspected carrier of Methicillin resistant Staphylococcus aureus: Secondary | ICD-10-CM

## 2015-11-18 DIAGNOSIS — I2510 Atherosclerotic heart disease of native coronary artery without angina pectoris: Secondary | ICD-10-CM | POA: Diagnosis present

## 2015-11-18 DIAGNOSIS — R079 Chest pain, unspecified: Secondary | ICD-10-CM | POA: Diagnosis not present

## 2015-11-18 DIAGNOSIS — K3184 Gastroparesis: Secondary | ICD-10-CM | POA: Diagnosis present

## 2015-11-18 DIAGNOSIS — Z7982 Long term (current) use of aspirin: Secondary | ICD-10-CM

## 2015-11-18 DIAGNOSIS — E1122 Type 2 diabetes mellitus with diabetic chronic kidney disease: Secondary | ICD-10-CM | POA: Diagnosis present

## 2015-11-18 DIAGNOSIS — R0602 Shortness of breath: Secondary | ICD-10-CM

## 2015-11-18 DIAGNOSIS — Z89519 Acquired absence of unspecified leg below knee: Secondary | ICD-10-CM

## 2015-11-18 DIAGNOSIS — I132 Hypertensive heart and chronic kidney disease with heart failure and with stage 5 chronic kidney disease, or end stage renal disease: Secondary | ICD-10-CM | POA: Diagnosis present

## 2015-11-18 DIAGNOSIS — R778 Other specified abnormalities of plasma proteins: Secondary | ICD-10-CM

## 2015-11-18 DIAGNOSIS — Z880 Allergy status to penicillin: Secondary | ICD-10-CM

## 2015-11-18 DIAGNOSIS — Z992 Dependence on renal dialysis: Secondary | ICD-10-CM

## 2015-11-18 DIAGNOSIS — N2581 Secondary hyperparathyroidism of renal origin: Secondary | ICD-10-CM | POA: Diagnosis present

## 2015-11-18 DIAGNOSIS — E1143 Type 2 diabetes mellitus with diabetic autonomic (poly)neuropathy: Secondary | ICD-10-CM | POA: Diagnosis present

## 2015-11-18 DIAGNOSIS — Z87891 Personal history of nicotine dependence: Secondary | ICD-10-CM

## 2015-11-18 DIAGNOSIS — N186 End stage renal disease: Secondary | ICD-10-CM | POA: Diagnosis present

## 2015-11-18 DIAGNOSIS — Z79899 Other long term (current) drug therapy: Secondary | ICD-10-CM

## 2015-11-18 DIAGNOSIS — Z882 Allergy status to sulfonamides status: Secondary | ICD-10-CM

## 2015-11-18 DIAGNOSIS — K219 Gastro-esophageal reflux disease without esophagitis: Secondary | ICD-10-CM | POA: Diagnosis present

## 2015-11-18 DIAGNOSIS — D631 Anemia in chronic kidney disease: Secondary | ICD-10-CM | POA: Diagnosis present

## 2015-11-18 DIAGNOSIS — Z8249 Family history of ischemic heart disease and other diseases of the circulatory system: Secondary | ICD-10-CM

## 2015-11-18 DIAGNOSIS — D638 Anemia in other chronic diseases classified elsewhere: Secondary | ICD-10-CM

## 2015-11-18 DIAGNOSIS — R7989 Other specified abnormal findings of blood chemistry: Secondary | ICD-10-CM

## 2015-11-18 DIAGNOSIS — Z833 Family history of diabetes mellitus: Secondary | ICD-10-CM

## 2015-11-18 LAB — CBC
HCT: 27.6 % — ABNORMAL LOW (ref 35.0–47.0)
Hemoglobin: 9.5 g/dL — ABNORMAL LOW (ref 12.0–16.0)
MCH: 32 pg (ref 26.0–34.0)
MCHC: 34.5 g/dL (ref 32.0–36.0)
MCV: 92.9 fL (ref 80.0–100.0)
PLATELETS: 177 10*3/uL (ref 150–440)
RBC: 2.97 MIL/uL — AB (ref 3.80–5.20)
RDW: 17.2 % — AB (ref 11.5–14.5)
WBC: 5.9 10*3/uL (ref 3.6–11.0)

## 2015-11-18 LAB — BASIC METABOLIC PANEL
ANION GAP: 3 — AB (ref 5–15)
BUN: 15 mg/dL (ref 6–20)
CHLORIDE: 102 mmol/L (ref 101–111)
CO2: 34 mmol/L — ABNORMAL HIGH (ref 22–32)
CREATININE: 2.9 mg/dL — AB (ref 0.44–1.00)
Calcium: 9.1 mg/dL (ref 8.9–10.3)
GFR calc non Af Amer: 18 mL/min — ABNORMAL LOW (ref >60)
GFR, EST AFRICAN AMERICAN: 20 mL/min — AB (ref >60)
Glucose, Bld: 148 mg/dL — ABNORMAL HIGH (ref 65–99)
POTASSIUM: 3.2 mmol/L — AB (ref 3.5–5.1)
SODIUM: 139 mmol/L (ref 135–145)

## 2015-11-18 LAB — TROPONIN I: Troponin I: 0.07 ng/mL (ref <0.03)

## 2015-11-18 MED ORDER — ONDANSETRON HCL 4 MG/2ML IJ SOLN
INTRAMUSCULAR | Status: AC
  Administered 2015-11-18: 4 mg via INTRAVENOUS
  Filled 2015-11-18: qty 2

## 2015-11-18 MED ORDER — MORPHINE SULFATE (PF) 4 MG/ML IV SOLN
INTRAVENOUS | Status: AC
  Administered 2015-11-18: 4 mg via INTRAVENOUS
  Filled 2015-11-18: qty 1

## 2015-11-18 MED ORDER — MORPHINE SULFATE (PF) 4 MG/ML IV SOLN
4.0000 mg | Freq: Once | INTRAVENOUS | Status: AC
  Administered 2015-11-18: 4 mg via INTRAVENOUS

## 2015-11-18 MED ORDER — NITROGLYCERIN 0.4 MG SL SUBL
0.4000 mg | SUBLINGUAL_TABLET | SUBLINGUAL | Status: DC | PRN
  Administered 2015-11-18: 0.4 mg via SUBLINGUAL

## 2015-11-18 MED ORDER — GI COCKTAIL ~~LOC~~
30.0000 mL | Freq: Once | ORAL | Status: DC
  Filled 2015-11-18: qty 30

## 2015-11-18 MED ORDER — NITROGLYCERIN 0.4 MG SL SUBL
SUBLINGUAL_TABLET | SUBLINGUAL | Status: AC
  Administered 2015-11-18: 0.4 mg via SUBLINGUAL
  Filled 2015-11-18: qty 3

## 2015-11-18 MED ORDER — ONDANSETRON HCL 4 MG/2ML IJ SOLN
4.0000 mg | Freq: Once | INTRAMUSCULAR | Status: AC
  Administered 2015-11-18: 4 mg via INTRAVENOUS

## 2015-11-18 NOTE — ED Provider Notes (Signed)
[facility] Emergency Department Provider Note    ____________________________________________   I have reviewed the triage vital signs and the nursing notes.   HISTORY  Chief Complaint Chest Pain   History limited by: Not Limited   HPI Kimberly Montgomery is a 50 y.o. female who presents to the emergency department today because of concerns for chest pain. This started this evening. This located in the left chest. It is severe. The patient has had similar pain in the past although slightly different from her previous episodes of coronary artery disease. Patient denies any particularly unusual activity today. She did feel short of breath with the chest pain. No recent fevers or cough.   Past Medical History:  Diagnosis Date  . Asthma   . CAD (coronary artery disease)   . CHF (congestive heart failure) (HCC)   . Diabetes mellitus without complication (HCC)   . Endometriosis   . ESRD (end stage renal disease) (HCC)   . Gastroparesis   . GERD (gastroesophageal reflux disease)   . HLD (hyperlipidemia)   . Hypertension   . Renal disorder     Patient Active Problem List   Diagnosis Date Noted  . Bipolar I disorder, most recent episode depressed (HCC)   . Altered mental status 08/24/2015  . ESRD (end stage renal disease) (HCC)   . Ileus (HCC)   . Bipolar I disorder (HCC) 07/25/2015  . Seizures (HCC) 07/25/2015  . Peritonitis (HCC) 07/16/2015  . Unstable angina (HCC) 07/09/2015  . ESRD on peritoneal dialysis (HCC) 07/09/2015  . Accelerated hypertension 07/09/2015  . Type 2 diabetes mellitus (HCC) 07/09/2015  . CAD (coronary artery disease) 07/09/2015  . HLD (hyperlipidemia) 07/09/2015  . GERD (gastroesophageal reflux disease) 07/09/2015  . Chest pain 05/12/2015    Past Surgical History:  Procedure Laterality Date  . BELOW KNEE LEG AMPUTATION    . CARDIAC CATHETERIZATION Right 07/10/2015   Procedure: Left Heart Cath and Coronary Angiography;   Surgeon: Laurier Nancy, MD;  Location: [facility] INVASIVE CV LAB;  Service: Cardiovascular;  Laterality: Right;  . CARDIAC CATHETERIZATION Right 09/14/2015   Procedure: Left Heart Cath and Coronary Angiography;  Surgeon: Laurier Nancy, MD;  Location: [facility] INVASIVE CV LAB;  Service: Cardiovascular;  Laterality: Right;  . CARDIAC CATHETERIZATION N/A 09/14/2015   Procedure: Coronary Stent Intervention;  Surgeon: Alwyn Pea, MD;  Location: [facility] INVASIVE CV LAB;  Service: Cardiovascular;  Laterality: N/A;  . hd fistula surgery with reversal    . HYSTEROTOMY    . pd tube inserstion    . PERIPHERAL VASCULAR CATHETERIZATION N/A 08/30/2015   Procedure: Dialysis/Perma Catheter Insertion;  Surgeon: Annice Needy, MD;  Location: [facility] INVASIVE CV LAB;  Service: Cardiovascular;  Laterality: N/A;    Prior to Admission medications   Medication Sig Start Date End Date Taking? Authorizing Provider  acetaminophen (TYLENOL) 325 MG tablet Take 650 mg by mouth every 6 (six) hours as needed for mild pain.     Historical Provider, MD  aspirin EC 81 MG tablet Take 81 mg by mouth daily.    Historical Provider, MD  bisacodyl (DULCOLAX) 10 MG suppository Place 10 mg rectally daily as needed for moderate constipation. Use if no relief from Milk of Magnesia    Historical Provider, MD  Bismuth Tribromoph-Petrolatum (XEROFORM PETROLATUM DRESSING) PADS Apply 1 each topically every 3 (three) days.    Historical Provider, MD  calcitRIOL (ROCALTROL) 0.25 MCG capsule Take 0.25 mcg by mouth daily.    Historical  Provider, MD  calcium acetate (PHOSLO) 667 MG capsule Take 3 capsules (2,001 mg total) by mouth 3 (three) times daily with meals. 07/22/15   Enedina Finner, MD  calcium carbonate (TUMS - DOSED IN MG ELEMENTAL CALCIUM) 500 MG chewable tablet Chew 1 tablet (200 mg of elemental calcium total) by mouth 3 (three) times daily with meals. 08/02/15   Adrian Saran, MD  clopidogrel (PLAVIX) 75 MG tablet Take 75 mg by mouth daily.    Historical  Provider, MD  divalproex (DEPAKOTE) 500 MG DR tablet Take 1,000 mg by mouth 3 (three) times daily.    Historical Provider, MD  docusate sodium (COLACE) 100 MG capsule Take 100 mg by mouth at bedtime.     Historical Provider, MD  erythromycin (E-MYCIN) 250 MG tablet Take 250 mg by mouth 4 (four) times daily.    Historical Provider, MD  insulin aspart (NOVOLOG) 100 UNIT/ML injection Inject 0-9 Units into the skin 3 (three) times daily with meals. 15 units three times a day with meals, 5 units with snacks    Historical Provider, MD  insulin glargine (LANTUS) 100 UNIT/ML injection Inject 0.08 mLs (8 Units total) into the skin at bedtime. 08/30/15   Milagros Loll, MD  isosorbide mononitrate (IMDUR) 60 MG 24 hr tablet Take 1 tablet (60 mg total) by mouth daily. 07/11/15   Auburn Bilberry, MD  lactulose (CHRONULAC) 10 GM/15ML solution Take 10 g by mouth daily as needed for mild constipation.    Historical Provider, MD  levETIRAcetam (KEPPRA) 500 MG tablet Take 1 tablet (500 mg total) by mouth every morning. 08/02/15   Adrian Saran, MD  lisinopril (PRINIVIL,ZESTRIL) 40 MG tablet Take 40 mg by mouth daily. 06/26/15   Historical Provider, MD  metoCLOPramide (REGLAN) 10 MG tablet Take 1 tablet (10 mg total) by mouth 4 (four) times daily. 08/02/15   Adrian Saran, MD  metolazone (ZAROXOLYN) 5 MG tablet Take 5 mg by mouth daily as needed (fluid).    Historical Provider, MD  metoprolol (LOPRESSOR) 50 MG tablet Take 1 tablet (50 mg total) by mouth 2 (two) times daily. 09/15/15   Srikar Sudini, MD  nicotine (NICODERM CQ - DOSED IN MG/24 HOURS) 14 mg/24hr patch Place 14 mg onto the skin daily.    Historical Provider, MD  nitroGLYCERIN (NITROSTAT) 0.4 MG SL tablet Place 0.4 mg under the tongue every 5 (five) minutes as needed for chest pain. Reported on 07/10/2015    Historical Provider, MD  nystatin (MYCOSTATIN) powder Apply 1 g topically 2 (two) times daily.    Historical Provider, MD  omeprazole (PRILOSEC) 20 MG capsule Take 40  mg by mouth 2 (two) times daily before a meal.    Historical Provider, MD  ondansetron (ZOFRAN ODT) 4 MG disintegrating tablet Take 1 tablet (4 mg total) by mouth every 6 (six) hours as needed for nausea or vomiting. 07/16/15   Sharyn Creamer, MD  oxyCODONE (OXY IR/ROXICODONE) 5 MG immediate release tablet Take 1 tablet (5 mg total) by mouth every 6 (six) hours as needed for severe pain. 09/15/15   Srikar Sudini, MD  polyethylene glycol (MIRALAX / GLYCOLAX) packet Take 17 g by mouth daily. Patient taking differently: Take 17 g by mouth 2 (two) times daily.  08/02/15   Adrian Saran, MD  ranolazine (RANEXA) 500 MG 12 hr tablet Take 1 tablet (500 mg total) by mouth 2 (two) times daily. 07/11/15   Auburn Bilberry, MD  risperiDONE (RISPERDAL) 1 MG tablet Take 1 mg by mouth 3 (three)  times daily.     Historical Provider, MD  senna (SENOKOT) 8.6 MG TABS tablet Take 2 tablets by mouth at bedtime as needed for mild constipation.    Historical Provider, MD  senna (SENOKOT) 8.6 MG TABS tablet Take 2 tablets by mouth 2 (two) times daily.    Historical Provider, MD  torsemide (DEMADEX) 100 MG tablet Take 100 mg by mouth daily.    Historical Provider, MD    Allergies Cephalosporins; Penicillins; Lamictal [lamotrigine]; Phenergan [promethazine hcl]; Pravastatin; and Sulfa antibiotics  Family History  Problem Relation Age of Onset  . CAD    . Diabetes    . Bipolar disorder    . Cervical cancer Mother     Social History Social History  Substance Use Topics  . Smoking status: Former Smoker    Packs/day: 0.50    Types: Cigarettes  . Smokeless tobacco: Not on file  . Alcohol use No    Review of Systems  Constitutional: Negative for fever. Cardiovascular: Positive for chest pain. Respiratory: Negative for shortness of breath. Gastrointestinal: Negative for abdominal pain, vomiting and diarrhea. Neurological: Negative for headaches, focal weakness or numbness.  10-point ROS otherwise  negative.  ____________________________________________   PHYSICAL EXAM:  VITAL SIGNS: ED Triage Vitals  Enc Vitals Group     BP 11/18/15 2012 (!) 213/86     Pulse Rate 11/18/15 2010 96     Resp 11/18/15 2010 (!) 22     Temp 11/18/15 2010 98.2 F (36.8 C)     Temp Source 11/18/15 2010 Oral     SpO2 11/18/15 2010 96 %     Weight 11/18/15 2010 252 lb 3.3 oz (114.4 kg)     Height 11/18/15 2010 5\' 9"  (1.753 m)     Head Circumference --      Peak Flow --      Pain Score 11/18/15 2011 7   Constitutional: Alert and oriented. Appears slightly uncomfortable Eyes: Conjunctivae are normal. PERRL. Normal extraocular movements. ENT   Head: Normocephalic and atraumatic.   Nose: No congestion/rhinnorhea.   Mouth/Throat: Mucous membranes are moist.   Neck: No stridor. Hematological/Lymphatic/Immunilogical: No cervical lymphadenopathy. Cardiovascular: Normal rate, regular rhythm.  No murmurs, rubs, or gallops. Respiratory: Normal respiratory effort without tachypnea nor retractions. Breath sounds are clear and equal bilaterally. No wheezes/rales/rhonchi. Gastrointestinal: Soft and nontender. No distention.  Genitourinary: Deferred Musculoskeletal: Normal range of motion in all extremities. No joint effusions.   Neurologic:  Normal speech and language. No gross focal neurologic deficits are appreciated.  Skin:  Skin is warm, dry and intact. No rash noted. Psychiatric: Mood and affect are normal. Speech and behavior are normal. Patient exhibits appropriate insight and judgment.  ____________________________________________    LABS (pertinent positives/negatives)  Labs Reviewed  BASIC METABOLIC PANEL - Abnormal; Notable for the following:       Result Value   Potassium 3.2 (*)    CO2 34 (*)    Glucose, Bld 148 (*)    Creatinine, Ser 2.90 (*)    GFR calc non Af Amer 18 (*)    GFR calc Af Amer 20 (*)    Anion gap 3 (*)    All other components within normal limits  CBC -  Abnormal; Notable for the following:    RBC 2.97 (*)    Hemoglobin 9.5 (*)    HCT 27.6 (*)    RDW 17.2 (*)    All other components within normal limits  TROPONIN I - Abnormal; Notable for the  following:    Troponin I 0.07 (*)    All other components within normal limits     ____________________________________________   EKG  I, Phineas Semen, attending physician, personally viewed and interpreted this EKG  EKG Time: 2015 Rate: 89 Rhythm: normal sinus rhythm Axis: normal Intervals: qtc 451 QRS: narrow ST changes: no st elevation Impression: normal ekg  I, Phineas Semen, attending physician, personally viewed and interpreted this EKG  EKG Time: 2211 Rate: 85 Rhythm: normal sinus rhythm Axis: normal Intervals: qtc 464 QRS: narrow ST changes: no st elevation Impression: normal ekg    ____________________________________________    RADIOLOGY  CXR IMPRESSION:  Vascular congestion noted. Lungs remain grossly clear.     ____________________________________________   PROCEDURES  Procedures  ____________________________________________   INITIAL IMPRESSION / ASSESSMENT AND PLAN / ED COURSE  Pertinent labs & imaging results that were available during my care of the patient were reviewed by me and considered in my medical decision making (see chart for details).  Patient presented to the emergency department today because of concerns for chest pain, patient does have a history of coronary artery disease. On exam patient appears slightly uncomfortable. Given that the patient states her pain is slightly different from an artery or cardiac pain will try initially GI cocktail. Additionally will get blood work.  Clinical Course   GI cocktail was unaffected. Will try morphine. Patient was given aspirin by EMS. Patient's troponin was elevated. Will plan admission to the hospital service. ____________________________________________   FINAL CLINICAL  IMPRESSION(S) / ED DIAGNOSES  Final diagnoses:  Chest pain, unspecified chest pain type  Elevated troponin     Note: This dictation was prepared with Dragon dictation. Any transcriptional errors that result from this process are unintentional    Phineas Semen, MD 11/18/15 [phone]

## 2015-11-18 NOTE — ED Triage Notes (Addendum)
Patient to rm 17 via EMS from local nursing home with complaint of chest pain. Reports pain for approximately 1 hours.  Pain substernal radiating to left arm, relieved slightly with sublingual nitro.  bp 219/108 initial.  Sinus rhythm on cardiac monitor. fsbs 162.

## 2015-11-18 NOTE — ED Notes (Signed)
Patient transported to X-ray 

## 2015-11-19 LAB — LIPID PANEL
Cholesterol: 215 mg/dL — ABNORMAL HIGH (ref 0–200)
HDL: 63 mg/dL (ref >40)
LDL CALC: 115 mg/dL — AB (ref 0–99)
Total CHOL/HDL Ratio: 3.4 RATIO
Triglycerides: 186 mg/dL — ABNORMAL HIGH (ref <150)
VLDL: 37 mg/dL (ref 0–40)

## 2015-11-19 LAB — GLUCOSE, CAPILLARY
GLUCOSE-CAPILLARY: 184 mg/dL — AB (ref 65–99)
GLUCOSE-CAPILLARY: 185 mg/dL — AB (ref 65–99)
GLUCOSE-CAPILLARY: 105 mg/dL — AB (ref 65–99)
GLUCOSE-CAPILLARY: 147 mg/dL — AB (ref 65–99)
Glucose-Capillary: 161 mg/dL — ABNORMAL HIGH (ref 65–99)

## 2015-11-19 LAB — PROTIME-INR
INR: 0.96
PROTHROMBIN TIME: 12.8 s (ref 11.4–15.2)

## 2015-11-19 LAB — MRSA PCR SCREENING: MRSA BY PCR: POSITIVE — AB

## 2015-11-19 LAB — APTT: APTT: 25 s (ref 24–36)

## 2015-11-19 LAB — MAGNESIUM: Magnesium: 1.9 mg/dL (ref 1.7–2.4)

## 2015-11-19 LAB — TROPONIN I
TROPONIN I: 0.11 ng/mL — AB (ref <0.03)
TROPONIN I: 0.12 ng/mL — AB (ref <0.03)
TROPONIN I: 0.13 ng/mL — AB (ref <0.03)

## 2015-11-19 LAB — HEPARIN LEVEL (UNFRACTIONATED): HEPARIN UNFRACTIONATED: 0.21 [IU]/mL — AB (ref 0.30–0.70)

## 2015-11-19 MED ORDER — SODIUM CHLORIDE 0.45 % IV SOLN
INTRAVENOUS | Status: DC
  Administered 2015-11-19 – 2015-11-20 (×2): via INTRAVENOUS

## 2015-11-19 MED ORDER — CALCITRIOL 0.25 MCG PO CAPS
0.2500 ug | ORAL_CAPSULE | Freq: Every day | ORAL | Status: DC
Start: 2015-11-19 — End: 2015-11-20
  Administered 2015-11-19: 0.25 ug via ORAL
  Filled 2015-11-19: qty 1

## 2015-11-19 MED ORDER — DOCUSATE SODIUM 100 MG PO CAPS
100.0000 mg | ORAL_CAPSULE | Freq: Every day | ORAL | Status: DC
  Administered 2015-11-19 – 2015-11-20 (×2): 100 mg via ORAL
  Filled 2015-11-19 (×2): qty 1

## 2015-11-19 MED ORDER — ENOXAPARIN SODIUM 40 MG/0.4ML ~~LOC~~ SOLN: 40.0000 mg | SUBCUTANEOUS | Status: DC

## 2015-11-19 MED ORDER — BISACODYL 10 MG RE SUPP: 10.0000 mg | Freq: Every day | RECTAL | Status: DC | PRN

## 2015-11-19 MED ORDER — METOPROLOL TARTRATE 50 MG PO TABS
50.0000 mg | ORAL_TABLET | Freq: Two times a day (BID) | ORAL | Status: DC
  Administered 2015-11-19 – 2015-11-21 (×4): 50 mg via ORAL
  Filled 2015-11-19 (×4): qty 1

## 2015-11-19 MED ORDER — OXYCODONE HCL 5 MG PO TABS
5.0000 mg | ORAL_TABLET | Freq: Four times a day (QID) | ORAL | Status: DC | PRN
  Administered 2015-11-19 – 2015-11-20 (×6): 5 mg via ORAL
  Filled 2015-11-19 (×6): qty 1

## 2015-11-19 MED ORDER — HEPARIN BOLUS VIA INFUSION
4000.0000 [IU] | Freq: Once | INTRAVENOUS | Status: AC
  Administered 2015-11-19: 4000 [IU] via INTRAVENOUS
  Filled 2015-11-19: qty 4000

## 2015-11-19 MED ORDER — COLESEVELAM HCL 625 MG PO TABS
625.0000 mg | ORAL_TABLET | Freq: Two times a day (BID) | ORAL | Status: DC
  Administered 2015-11-21: 625 mg via ORAL
  Filled 2015-11-19 (×4): qty 1

## 2015-11-19 MED ORDER — HYDRALAZINE HCL 25 MG PO TABS: 25.0000 mg | ORAL_TABLET | Freq: Every evening | ORAL | Status: DC

## 2015-11-19 MED ORDER — RANOLAZINE ER 500 MG PO TB12
500.0000 mg | ORAL_TABLET | Freq: Two times a day (BID) | ORAL | Status: DC
  Administered 2015-11-19 – 2015-11-20 (×3): 500 mg via ORAL
  Filled 2015-11-19 (×3): qty 1

## 2015-11-19 MED ORDER — CLOPIDOGREL BISULFATE 75 MG PO TABS
75.0000 mg | ORAL_TABLET | Freq: Every day | ORAL | Status: DC
  Administered 2015-11-19 – 2015-11-21 (×2): 75 mg via ORAL
  Filled 2015-11-19 (×2): qty 1

## 2015-11-19 MED ORDER — CHLORHEXIDINE GLUCONATE CLOTH 2 % EX PADS
6.0000 | MEDICATED_PAD | Freq: Every day | CUTANEOUS | Status: DC
  Administered 2015-11-20: 6 via TOPICAL

## 2015-11-19 MED ORDER — ACETAMINOPHEN 325 MG PO TABS
650.0000 mg | ORAL_TABLET | ORAL | Status: DC | PRN
  Administered 2015-11-19 – 2015-11-20 (×2): 650 mg via ORAL
  Filled 2015-11-19 (×3): qty 2

## 2015-11-19 MED ORDER — RISPERIDONE 1 MG PO TABS
1.0000 mg | ORAL_TABLET | Freq: Three times a day (TID) | ORAL | Status: DC
  Administered 2015-11-19 – 2015-11-21 (×5): 1 mg via ORAL
  Filled 2015-11-19 (×7): qty 1

## 2015-11-19 MED ORDER — ISOSORBIDE MONONITRATE ER 60 MG PO TB24
60.0000 mg | ORAL_TABLET | Freq: Every day | ORAL | Status: DC
  Administered 2015-11-19: 60 mg via ORAL
  Filled 2015-11-19: qty 1

## 2015-11-19 MED ORDER — ONDANSETRON 4 MG PO TBDP
4.0000 mg | ORAL_TABLET | Freq: Four times a day (QID) | ORAL | Status: DC | PRN
  Filled 2015-11-19: qty 1

## 2015-11-19 MED ORDER — LEVETIRACETAM 500 MG PO TABS
500.0000 mg | ORAL_TABLET | Freq: Every morning | ORAL | Status: DC
  Administered 2015-11-19 – 2015-11-21 (×2): 500 mg via ORAL
  Filled 2015-11-19 (×2): qty 1

## 2015-11-19 MED ORDER — INSULIN GLARGINE 100 UNIT/ML ~~LOC~~ SOLN
8.0000 [IU] | Freq: Every day | SUBCUTANEOUS | Status: DC
  Administered 2015-11-19 – 2015-11-20 (×2): 8 [IU] via SUBCUTANEOUS
  Filled 2015-11-19 (×3): qty 0.08

## 2015-11-19 MED ORDER — POLYETHYLENE GLYCOL 3350 17 G PO PACK
17.0000 g | PACK | Freq: Two times a day (BID) | ORAL | Status: DC
  Administered 2015-11-19 – 2015-11-21 (×3): 17 g via ORAL
  Filled 2015-11-19 (×5): qty 1

## 2015-11-19 MED ORDER — CALCIUM ACETATE (PHOS BINDER) 667 MG PO CAPS
2001.0000 mg | ORAL_CAPSULE | Freq: Three times a day (TID) | ORAL | Status: DC
  Administered 2015-11-19 – 2015-11-21 (×4): 2001 mg via ORAL
  Filled 2015-11-19 (×4): qty 3

## 2015-11-19 MED ORDER — ERYTHROMYCIN BASE 250 MG PO TABS
250.0000 mg | ORAL_TABLET | Freq: Four times a day (QID) | ORAL | Status: DC
Start: 2015-11-19 — End: 2015-11-21
  Administered 2015-11-19 – 2015-11-21 (×6): 250 mg via ORAL
  Filled 2015-11-19 (×8): qty 1

## 2015-11-19 MED ORDER — CALCIUM CARBONATE ANTACID 500 MG PO CHEW
1.0000 | CHEWABLE_TABLET | Freq: Three times a day (TID) | ORAL | Status: DC
  Administered 2015-11-19 – 2015-11-21 (×4): 200 mg via ORAL
  Filled 2015-11-19 (×4): qty 1

## 2015-11-19 MED ORDER — SENNA 8.6 MG PO TABS: 2.0000 | ORAL_TABLET | Freq: Every evening | ORAL | Status: DC | PRN

## 2015-11-19 MED ORDER — XEROFORM PETROLATUM DRESSING EX PADS: 1.0000 | MEDICATED_PAD | CUTANEOUS | Status: DC

## 2015-11-19 MED ORDER — SODIUM CHLORIDE 0.9% FLUSH: 3.0000 mL | INTRAVENOUS | Status: DC | PRN

## 2015-11-19 MED ORDER — NYSTATIN 100000 UNIT/GM EX POWD
1.0000 g | Freq: Two times a day (BID) | CUTANEOUS | Status: DC
  Administered 2015-11-19 – 2015-11-21 (×4): 1 g via TOPICAL
  Filled 2015-11-19: qty 15

## 2015-11-19 MED ORDER — PANTOPRAZOLE SODIUM 40 MG PO TBEC
80.0000 mg | DELAYED_RELEASE_TABLET | Freq: Every day | ORAL | Status: DC
  Administered 2015-11-19 – 2015-11-21 (×2): 80 mg via ORAL
  Filled 2015-11-19 (×2): qty 2

## 2015-11-19 MED ORDER — ASPIRIN 81 MG PO CHEW
81.0000 mg | CHEWABLE_TABLET | ORAL | Status: AC
  Administered 2015-11-20: 81 mg via ORAL
  Filled 2015-11-19: qty 1

## 2015-11-19 MED ORDER — METOLAZONE 5 MG PO TABS: 5.0000 mg | ORAL_TABLET | Freq: Every day | ORAL | Status: DC | PRN

## 2015-11-19 MED ORDER — NICOTINE 14 MG/24HR TD PT24
14.0000 mg | MEDICATED_PATCH | Freq: Every day | TRANSDERMAL | Status: DC
  Administered 2015-11-19 – 2015-11-21 (×2): 14 mg via TRANSDERMAL
  Filled 2015-11-19 (×2): qty 1

## 2015-11-19 MED ORDER — HYDRALAZINE HCL 20 MG/ML IJ SOLN
10.0000 mg | INTRAMUSCULAR | Status: DC | PRN
  Administered 2015-11-19: 10 mg via INTRAVENOUS
  Filled 2015-11-19: qty 1

## 2015-11-19 MED ORDER — INSULIN ASPART 100 UNIT/ML ~~LOC~~ SOLN
0.0000 [IU] | SUBCUTANEOUS | Status: DC
  Administered 2015-11-19: 2 [IU] via SUBCUTANEOUS
  Administered 2015-11-19: 4 [IU] via SUBCUTANEOUS
  Filled 2015-11-19: qty 2
  Filled 2015-11-19: qty 4

## 2015-11-19 MED ORDER — LACTULOSE 10 GM/15ML PO SOLN
10.0000 g | Freq: Every day | ORAL | Status: DC | PRN
  Administered 2015-11-19: 10 g via ORAL
  Filled 2015-11-19: qty 30

## 2015-11-19 MED ORDER — SODIUM CHLORIDE 0.9 % WEIGHT BASED INFUSION
1.0000 mL/kg/h | INTRAVENOUS | Status: DC
  Administered 2015-11-20: 1 mL/kg/h via INTRAVENOUS

## 2015-11-19 MED ORDER — SODIUM CHLORIDE 0.9 % WEIGHT BASED INFUSION
3.0000 mL/kg/h | INTRAVENOUS | Status: DC
  Administered 2015-11-20: 3 mL/kg/h via INTRAVENOUS

## 2015-11-19 MED ORDER — TORSEMIDE 20 MG PO TABS
100.0000 mg | ORAL_TABLET | Freq: Every day | ORAL | Status: DC
  Administered 2015-11-19: 100 mg via ORAL
  Filled 2015-11-19: qty 5

## 2015-11-19 MED ORDER — HEPARIN (PORCINE) IN NACL 100-0.45 UNIT/ML-% IJ SOLN
1600.0000 [IU]/h | INTRAMUSCULAR | Status: DC
  Administered 2015-11-19: 1200 [IU]/h via INTRAVENOUS
  Administered 2015-11-20: 1400 [IU]/h via INTRAVENOUS
  Filled 2015-11-19 (×3): qty 250

## 2015-11-19 MED ORDER — HYDRALAZINE HCL 50 MG PO TABS
50.0000 mg | ORAL_TABLET | Freq: Four times a day (QID) | ORAL | Status: DC
  Administered 2015-11-19 – 2015-11-21 (×5): 50 mg via ORAL
  Filled 2015-11-19 (×5): qty 1

## 2015-11-19 MED ORDER — NITROGLYCERIN 2 % TD OINT
1.0000 [in_us] | TOPICAL_OINTMENT | Freq: Four times a day (QID) | TRANSDERMAL | Status: DC
  Administered 2015-11-19 – 2015-11-21 (×4): 1 [in_us] via TOPICAL
  Filled 2015-11-19 (×4): qty 1

## 2015-11-19 MED ORDER — METOCLOPRAMIDE HCL 10 MG PO TABS
10.0000 mg | ORAL_TABLET | Freq: Four times a day (QID) | ORAL | Status: DC
  Administered 2015-11-19 – 2015-11-21 (×6): 10 mg via ORAL
  Filled 2015-11-19 (×6): qty 1

## 2015-11-19 MED ORDER — SODIUM CHLORIDE 0.9% FLUSH
3.0000 mL | Freq: Two times a day (BID) | INTRAVENOUS | Status: DC
  Administered 2015-11-19 – 2015-11-20 (×2): 3 mL via INTRAVENOUS

## 2015-11-19 MED ORDER — LISINOPRIL 20 MG PO TABS
40.0000 mg | ORAL_TABLET | Freq: Every day | ORAL | Status: DC
  Administered 2015-11-19: 40 mg via ORAL
  Filled 2015-11-19: qty 2

## 2015-11-19 MED ORDER — HYDRALAZINE HCL 50 MG PO TABS: 100.0000 mg | ORAL_TABLET | Freq: Two times a day (BID) | ORAL | Status: DC

## 2015-11-19 MED ORDER — NITROGLYCERIN 0.4 MG SL SUBL
0.4000 mg | SUBLINGUAL_TABLET | SUBLINGUAL | Status: DC | PRN
Start: 2015-11-19 — End: 2015-11-19

## 2015-11-19 MED ORDER — MUPIROCIN 2 % EX OINT
1.0000 "application " | TOPICAL_OINTMENT | Freq: Two times a day (BID) | CUTANEOUS | Status: DC
  Administered 2015-11-19 – 2015-11-21 (×4): 1 via NASAL
  Filled 2015-11-19: qty 22

## 2015-11-19 MED ORDER — SODIUM CHLORIDE 0.9 % IV SOLN: 250.0000 mL | INTRAVENOUS | Status: DC | PRN

## 2015-11-19 MED ORDER — HEPARIN BOLUS VIA INFUSION
1400.0000 [IU] | Freq: Once | INTRAVENOUS | Status: AC
  Administered 2015-11-19: 1400 [IU] via INTRAVENOUS
  Filled 2015-11-19: qty 1400

## 2015-11-19 MED ORDER — DIVALPROEX SODIUM 500 MG PO DR TAB
1000.0000 mg | DELAYED_RELEASE_TABLET | Freq: Three times a day (TID) | ORAL | Status: DC
  Administered 2015-11-19 – 2015-11-21 (×5): 1000 mg via ORAL
  Filled 2015-11-19 (×6): qty 2

## 2015-11-19 NOTE — Discharge Instructions (Signed)
MRSA FAQs  What is MRSA?  Staphylococcus aureus (pronounced staff-ill-oh-KOK-us AW-ree-us), or "Staph" is a very common germ that about 1 out of every 3 people have on their skin or in their nose. This germ does not cause any problems for most people who have it on their skin. But sometimes it can cause serious infections such as skin or wound infections, pneumonia, or infections of the blood.   Antibiotics are given to kill Staph germs when they cause infections. Some Staph are resistant, meaning they cannot be killed by some antibiotics. "Methicillin-resistant Staphylococcus aureus" or "MRSA" is a type of Staph that is resistant to some of the antibiotics that are often used to treat Staph infections.  Who is most likely to get an MRSA infection?  In the hospital, people who are more likely to get an MRSA infection are people who:  · have other health conditions making them sick  · have been in the hospital or a nursing home  · have been treated with antibiotics.  People who are healthy and who have not been in the hospital or a nursing home can also get MRSA infections. These infections usually involve the skin. More information about this type of MRSA infection, known as "community-associated MRSA" infection, is available from the Centers for Disease Control and Prevention (CDC). http://www.cdc.gov/mrsa  How do I get an MRSA infection?  People who have MRSA germs on their skin or who are infected with MRSA may be able to spread the germ to other people. MRSA can be passed on to bed linens, bed rails, bathroom fixtures, and medical equipment. It can spread to other people on contaminated equipment and on the hands of doctors, nurses, other healthcare providers and visitors.  Can MRSA infections be treated?  Yes, there are antibiotics that can kill MRSA germs. Some patients with MRSA abscesses may need surgery to drain the infection. Your healthcare provider will determine which treatments are best for you.  What  are some of the things that hospitals are doing to prevent MRSA infections?  To prevent MRSA infections, doctors, nurses and other healthcare providers:  · Clean their hands with soap and water or an alcohol-based hand rub before and after caring for every patient.  · Carefully clean hospital rooms and medical equipment.  · Use Contact Precautions when caring for patients with MRSA. Contact Precautions mean:  ¨ Whenever possible, patients with MRSA will have a single room or will share a room only with someone else who also has MRSA.  ¨ Healthcare providers will put on gloves and wear a gown over their clothing while taking care of patients with MRSA.  ¨ Visitors may also be asked to wear a gown and gloves.  ¨ When leaving the room, hospital providers and visitors remove their gown and gloves and clean their hands.  ¨ Patients on Contact Precautions are asked to stay in their hospital rooms as much as possible. They should not go to common areas, such as the gift shop or cafeteria. They may go to other areas of the hospital for treatments and tests.  · May test some patients to see if they have MRSA on their skin. This test involves rubbing a cotton-tipped swab in the patient's nostrils or on the skin.  What can I do to help prevent MRSA infections?  In the hospital  · Make sure that all doctors, nurses, and other healthcare providers clean their hands with soap and water or an alcohol-based hand rub before   you have wounds or an intravascular device (such as a catheter or dialysis port) make sure that you know how to take care of them. Can my friends and family get MRSA when they visit me? The chance of getting MRSA while visiting a person who has MRSA is very low. To decrease the chance of getting MRSA your family and friends should:  Clean their hands before they enter your room and  when they leave.  Ask a healthcare provider if they need to wear protective gowns and gloves when they visit you. What do I need to do when I go home from the hospital? To prevent another MRSA infection and to prevent spreading MRSA to others:  Keep taking any antibiotics prescribed by your doctor. Don't take half-doses or stop before you complete your prescribed course.  Clean your hands often, especially before and after changing your wound dressing or bandage.  People who live with you should clean their hands often as well.  Keep any wounds clean and change bandages as instructed until healed.  Avoid sharing personal items such as towels or razors.  Wash and dry your clothes and bed linens in the warmest temperatures recommended on the labels.  Tell your healthcare providers that you have MRSA. This includes home health nurses and aides, therapists, and personnel in doctors' offices.  Your doctor may have more instructions for you. If you have questions, please ask your doctor or nurse. Developed and co-sponsored by [employer] for [employer] [place] [phone](SHEA); Infectious Diseases Society of America (IDSA); [facility]; Association for Professionals in Infection Control and Epidemiology (APIC); Centers for Disease Control and Prevention (CDC); and [employer].   This information is not intended to replace advice given to you by your health care provider. Make sure you discuss any questions you have with your health care provider.   Document Released: 03/23/2013 Document Reviewed: 06/01/2014 Elsevier Interactive Patient Education 2016 Elsevier Inc. Community-Associated MRSA CA-MRSA stands for community-associated methicillin-resistant Staphylococcus aureus. MRSA is a type of bacteria that is resistant to some common antibiotic medicines. It can cause infections in the skin and many other places in the body. Staphylococcus aureus, which is often  called staph, is a bacterium that normally lives on the skin or in the nose of some people. Staph on the surface of the skin or in the nose does not cause problems. However, if the staph enters the body through a cut, wound, or break in the skin, an infection can happen. Until recently, infections with the MRSA type of staph occurred mainly in hospitals and other health care settings. Now there are increasing problems with MRSA infections in the community as well. Infections with MRSA may be very serious or even life-threatening. CAUSES  All staph bacteria, including MRSA:  Are normally harmless unless they enter the body through a scratch, cut, or wound, such as with surgery.  Can be spread from person to person by touching contaminated objects or through direct contact.  Cases of MRSA diseases in the community have been associated with:  Recent antibiotic use.  Sharing contaminated towels or clothes.  Having active skin diseases.  Participating in contact sports.  Living in crowded settings.  IV drug use.  Community-associated MRSA infections are usually skin infections, but they may cause other severe illnesses, such as:  Pneumonia.  Bone or joint infections.  Bloodstream infections (sepsis). SYMPTOMS This condition usually starts with a skin infection. Signs and symptoms may vary and can include:  An infected area of skin that is red and swollen, feels painful, and is warm to the touch.  Pus under the skin or pus draining from the infected area.  Fever. DIAGNOSIS This condition is diagnosed by cultures of fluid samples that may come from:  Swabs that are taken from cuts or wounds in infected areas.  Nasal swabs.  Saliva or deep-cough specimens from the lungs (sputum).  Urine.  Blood. TREATMENT  Treatment varies and is based on how serious, how deep, or how extensive the infection is. For example:  Some skin infections, such as a small boil or abscess, may be  treated by draining pus from the site of the infection.  Deeper or more widespread soft tissue infections are usually treated with surgery to drain pus and with antibiotics that are given through a vein or by mouth. This may be recommended even if you are pregnant.  Serious infections may require a hospital stay. If antibiotics are prescribed, they may be needed for several weeks. HOME CARE INSTRUCTIONS  Take your antibiotics as directed by your health care provider. Finish the antibiotic even if you start to feel better.  Avoid close contact with people around you as much as possible. Do not use towels, razors, toothbrushes, bedding, or other items that will be used by others.  To fight the infection, follow your health care provider's instructions for wound care. Wash your hands before and after changing your bandages (dressings).  If you have an intravascular device, such as a catheter or port, make sure that you know how to care for it. This might include:  Keeping the skin around the area clean.  Avoiding having your blood pressure taken on the side where the catheter is located.  Knowing when to report any suspected problems to your health care provider.  Be sure to tell any health care providers who care for you that you have MRSA so they are aware of your infection. PREVENTION  Wash your hands frequently with soap and water for at least 15 seconds. If soap and water are not available, use alcohol-based hand disinfectants.  Make sure that people who live with you wash their hands often, too.  Do not share personal items. For example, avoid sharing razors and other personal hygiene items, towels, clothing, and athletic equipment.  Wash and dry your clothes and bedding at the warmest temperatures that are recommended on the labels.  Keep wounds covered. Pus from infected sores may contain MRSA and other bacteria. Keep cuts and abrasions clean and covered with germ-free  (sterile), dry bandages until they are healed.  If you have a wound that appears infected, ask your health care provider if a culture should be done for MRSA and other bacteria.  If you are breastfeeding, talk to your health care provider about MRSA. You may be asked to temporarily stop breastfeeding. SEEK IMMEDIATE MEDICAL CARE IF:  The infection appears to be getting worse. Signs include:  Increased warmth, redness, or tenderness around the wound site.  A red line that extends from the infection site.  A dark color in the area around the infection.  Wound drainage that is tan, yellow, or green.  A bad smell coming from the wound.  You feel nauseous, you vomit, or you cannot keep medicine down.  You have a fever.  You have difficulty breathing.   This information is not intended to replace advice given to you by your health care provider. Make sure you discuss any questions  you have with your health care provider.   Document Released: 06/21/2005 Document Revised: 04/08/2014 Document Reviewed: 06/21/2010 Elsevier Interactive Patient Education [employer]

## 2015-11-19 NOTE — Care Management Obs Status (Signed)
MEDICARE OBSERVATION STATUS NOTIFICATION   Patient Details  Name: Kimberly Montgomery MRN: [id number] Date of Birth: Aug 18, 1965   Medicare Observation Status Notification Given:   (copy left with patient on contact precautions/ verbalized understanding  moon notice)    Caren Macadam, RN 11/19/2015, 7:04 PM

## 2015-11-19 NOTE — Progress Notes (Signed)
ANTICOAGULATION CONSULT NOTE - Initial Consult  Pharmacy Consult for heparin bolus and drip Indication: chest pain/ACS  Allergies  Allergen Reactions  . Cephalosporins Anaphylaxis  . Penicillins Anaphylaxis and Other (See Comments)    Has patient had a PCN reaction causing immediate rash, facial/tongue/throat swelling, SOB or lightheadedness with hypotension: Yes Has patient had a PCN reaction causing severe rash involving mucus membranes or skin necrosis: No Has patient had a PCN reaction that required hospitalization No Has patient had a PCN reaction occurring within the last 10 years: No If all of the above answers are "NO", then may proceed with Cephalosporin use.  . Lamictal [Lamotrigine] Other (See Comments)    Reaction:  Hallucinations  . Phenergan [Promethazine Hcl] Nausea And Vomiting  . Pravastatin Other (See Comments)    Reaction:  Muscle pain   . Sulfa Antibiotics Other (See Comments)    Reaction:  Unknown     Patient Measurements: Height: 5\' 9"  (175.3 cm) Weight: 254 lb 4.8 oz (115.3 kg) IBW/kg (Calculated) : 66.2 Heparin Dosing Weight: 92.2 kg  Vital Signs: Temp: 97.7 F (36.5 C) (08/20 1137) Temp Source: Oral (08/20 1137) BP: 113/57 (08/20 1405) Pulse Rate: 88 (08/20 1137)  Labs:  Recent Labs  11/18/15 2133 11/19/15 0617 11/19/15 0936 11/19/15 1421  HGB 9.5*  --   --   --   HCT 27.6*  --   --   --   PLT 177  --   --   --   APTT  --   --   --  25  LABPROT  --   --   --  12.8  INR  --   --   --  0.96  CREATININE 2.90*  --   --   --   TROPONINI 0.07* 0.11* 0.12*  --     Estimated Creatinine Clearance: 30.8 mL/min (by C-G formula based on SCr of 2.9 mg/dL).   Medical History: Past Medical History:  Diagnosis Date  . Asthma   . CAD (coronary artery disease)   . CHF (congestive heart failure) (HCC)   . Diabetes mellitus without complication (HCC)   . Endometriosis   . ESRD (end stage renal disease) (HCC)   . Gastroparesis   . GERD  (gastroesophageal reflux disease)   . HLD (hyperlipidemia)   . Hypertension   . Renal disorder     Medications:  Scheduled:  . calcitRIOL  0.25 mcg Oral Daily  . calcium acetate  2,001 mg Oral TID WC  . calcium carbonate  1 tablet Oral TID WC  . clopidogrel  75 mg Oral Daily  . colesevelam  625 mg Oral BID WC  . divalproex  1,000 mg Oral TID  . docusate sodium  100 mg Oral QHS  . erythromycin  250 mg Oral QID  . hydrALAZINE  25 mg Oral QPM  . insulin aspart  0-24 Units Subcutaneous Q4H  . insulin glargine  8 Units Subcutaneous QHS  . isosorbide mononitrate  60 mg Oral Daily  . levETIRAcetam  500 mg Oral q morning - 10a  . lisinopril  40 mg Oral Daily  . metoCLOPramide  10 mg Oral QID  . metoprolol  50 mg Oral BID  . nicotine  14 mg Transdermal Daily  . nitroGLYCERIN  1 inch Topical Q6H  . nystatin  1 g Topical BID  . pantoprazole  80 mg Oral Daily  . polyethylene glycol  17 g Oral BID  . ranolazine  500 mg Oral BID  .  risperiDONE  1 mg Oral TID  . torsemide  100 mg Oral Daily   Infusions:  . sodium chloride 125 mL/hr at 11/19/15 1232  . heparin 1,200 Units/hr (11/19/15 1453)    Assessment: Pharmacy has been consulted to dose heparin bolus and drip in this 50 yoF for chest pain/ACS. Per med rec, patient was not taking anticoagulat PTA nor was administered anticoagulant during admission prior to heparin. Baseline labs were ordered. HL ordered for 6 hours after administration (08/20 @ 2100). CBC ordered with 08/21 AM labs.  Goal of Therapy:  Heparin level 0.3-0.7 units/ml Monitor platelets by anticoagulation protocol: Yes   Plan:  Give 4000 units bolus x 1 Start heparin infusion at 1200 units/hr Check anti-Xa level in 6 hours and daily while on heparin Continue to monitor H&H and platelets   Horris LatinoHolly Gilliam, PharmD Pharmacy Resident 11/19/2015 3:13 PM

## 2015-11-19 NOTE — H&P (Signed)
[facility] - [place] [facility] Admission History and Physical Kimberly Montgomery, D.O.  ---------------------------------------------------------------------------------------------------------------------   PATIENT NAME: Kimberly Montgomery MR#: [id number] DATE OF BIRTH: 03/07/1966 DATE OF ADMISSION: 11/18/2015 PRIMARY CARE PHYSICIAN: Kimberly Matin, MD  REQUESTING/REFERRING PHYSICIAN: ED Dr. Derrill Montgomery  CHIEF COMPLAINT: Chief Complaint  Patient presents with  . Chest Pain    HISTORY OF PRESENT ILLNESS: Kimberly Montgomery is a 50 y.o. female with a known history of Coronary artery disease, CHF, end-stage renal disease on dialysis, diabetes, hypertension, hyperlipidemia, GERD and gastroparesis was in a usual state of health until this afternoon when she developed left-sided chest pain radiating to the neck posteriorly and left arm. Pain was associated with shortness of breath but denies nausea, diaphoresis. Patient states that she has had similar episodes in the past. She called for help from her nursing staff was unable to contact anyone so she called 911 from her care center. She resides there long-term.  Of note patient ambulates with wheelchair only secondary to diabetic neuropathy. She denies any new exercise, trauma, falls I will contribute to musculoskeletal type chest pain.  Otherwise there has been no change in status. Patient has been taking medication as prescribed and there has been no recent change in medication or diet.  There has been no recent illness, travel or sick contacts.    Patient denies fevers/chills, weakness, dizziness, N/V/C/D, abdominal pain, dysuria/frequency, changes in mental status.    PAST MEDICAL HISTORY: Past Medical History:  Diagnosis Date  . Asthma   . CAD (coronary artery disease)   . CHF (congestive heart failure) (HCC)   . Diabetes mellitus without complication (HCC)   . Endometriosis   . ESRD (end stage renal disease) (HCC)   . Gastroparesis   .  GERD (gastroesophageal reflux disease)   . HLD (hyperlipidemia)   . Hypertension   . Renal disorder       PAST SURGICAL HISTORY: Past Surgical History:  Procedure Laterality Date  . BELOW KNEE LEG AMPUTATION    . CARDIAC CATHETERIZATION Right 07/10/2015   Procedure: Left Heart Cath and Coronary Angiography;  Surgeon: Laurier Nancy, MD;  Location: [facility] INVASIVE CV LAB;  Service: Cardiovascular;  Laterality: Right;  . CARDIAC CATHETERIZATION Right 09/14/2015   Procedure: Left Heart Cath and Coronary Angiography;  Surgeon: Laurier Nancy, MD;  Location: [facility] INVASIVE CV LAB;  Service: Cardiovascular;  Laterality: Right;  . CARDIAC CATHETERIZATION N/A 09/14/2015   Procedure: Coronary Stent Intervention;  Surgeon: Alwyn Pea, MD;  Location: [facility] INVASIVE CV LAB;  Service: Cardiovascular;  Laterality: N/A;  . hd fistula surgery with reversal    . HYSTEROTOMY    . pd tube inserstion    . PERIPHERAL VASCULAR CATHETERIZATION N/A 08/30/2015   Procedure: Dialysis/Perma Catheter Insertion;  Surgeon: Annice Needy, MD;  Location: [facility] INVASIVE CV LAB;  Service: Cardiovascular;  Laterality: N/A;      SOCIAL HISTORY: Social History  Substance Use Topics  . Smoking status: Former Smoker    Packs/day: 0.50    Types: Cigarettes  . Smokeless tobacco: Not on file  . Alcohol use No      FAMILY HISTORY: Family History  Problem Relation Age of Onset  . CAD    . Diabetes    . Bipolar disorder    . Cervical cancer Mother      MEDICATIONS AT HOME: Prior to Admission medications   Medication Sig Start Date End Date Taking? Authorizing Provider  bisacodyl (DULCOLAX) 10 MG suppository Place  10 mg rectally daily as needed for moderate constipation. Use if no relief from Milk of Magnesia   Yes Historical Provider, MD  Bismuth Tribromoph-Petrolatum (XEROFORM PETROLATUM DRESSING) PADS Apply 1 each topically every 3 (three) days.   Yes Historical Provider, MD  calcitRIOL (ROCALTROL) 0.25 MCG  capsule Take 0.25 mcg by mouth daily.   Yes Historical Provider, MD  calcium acetate (PHOSLO) 667 MG capsule Take 3 capsules (2,001 mg total) by mouth 3 (three) times daily with meals. 07/22/15  Yes Enedina Finner, MD  calcium carbonate (TUMS - DOSED IN MG ELEMENTAL CALCIUM) 500 MG chewable tablet Chew 1 tablet (200 mg of elemental calcium total) by mouth 3 (three) times daily with meals. 08/02/15  Yes Adrian Saran, MD  clopidogrel (PLAVIX) 75 MG tablet Take 75 mg by mouth daily.   Yes Historical Provider, MD  divalproex (DEPAKOTE) 500 MG DR tablet Take 1,000 mg by mouth 3 (three) times daily.   Yes Historical Provider, MD  docusate sodium (COLACE) 100 MG capsule Take 100 mg by mouth at bedtime.    Yes Historical Provider, MD  erythromycin (E-MYCIN) 250 MG tablet Take 250 mg by mouth 4 (four) times daily.   Yes Historical Provider, MD  hydrALAZINE (APRESOLINE) 25 MG tablet Take 25 mg by mouth every evening.   Yes Historical Provider, MD  insulin aspart (NOVOLOG) 100 UNIT/ML injection Inject 0-9 Units into the skin 3 (three) times daily with meals. 15 units three times a day with meals, 5 units with snacks   Yes Historical Provider, MD  insulin glargine (LANTUS) 100 UNIT/ML injection Inject 0.08 mLs (8 Units total) into the skin at bedtime. 08/30/15  Yes Srikar Sudini, MD  isosorbide mononitrate (IMDUR) 60 MG 24 hr tablet Take 1 tablet (60 mg total) by mouth daily. 07/11/15  Yes Auburn Bilberry, MD  lactulose (CHRONULAC) 10 GM/15ML solution Take 10 g by mouth daily as needed for mild constipation.   Yes Historical Provider, MD  levETIRAcetam (KEPPRA) 500 MG tablet Take 1 tablet (500 mg total) by mouth every morning. 08/02/15  Yes Sital Mody, MD  lisinopril (PRINIVIL,ZESTRIL) 40 MG tablet Take 40 mg by mouth daily. 06/26/15  Yes Historical Provider, MD  metoCLOPramide (REGLAN) 10 MG tablet Take 1 tablet (10 mg total) by mouth 4 (four) times daily. 08/02/15  Yes Adrian Saran, MD  metolazone (ZAROXOLYN) 5 MG tablet Take 5  mg by mouth daily as needed (fluid).   Yes Historical Provider, MD  metoprolol (LOPRESSOR) 50 MG tablet Take 1 tablet (50 mg total) by mouth 2 (two) times daily. 09/15/15  Yes Srikar Sudini, MD  nicotine (NICODERM CQ - DOSED IN MG/24 HOURS) 14 mg/24hr patch Place 14 mg onto the skin daily.   Yes Historical Provider, MD  nitroGLYCERIN (NITROSTAT) 0.4 MG SL tablet Place 0.4 mg under the tongue every 5 (five) minutes as needed for chest pain. Reported on 07/10/2015   Yes Historical Provider, MD  nystatin (MYCOSTATIN) powder Apply 1 g topically 2 (two) times daily.   Yes Historical Provider, MD  omeprazole (PRILOSEC) 20 MG capsule Take 40 mg by mouth 2 (two) times daily before a meal.   Yes Historical Provider, MD  ondansetron (ZOFRAN ODT) 4 MG disintegrating tablet Take 1 tablet (4 mg total) by mouth every 6 (six) hours as needed for nausea or vomiting. 07/16/15  Yes Sharyn Creamer, MD  oxyCODONE (OXY IR/ROXICODONE) 5 MG immediate release tablet Take 1 tablet (5 mg total) by mouth every 6 (six) hours as needed for  severe pain. 09/15/15  Yes Srikar Sudini, MD  polyethylene glycol (MIRALAX / GLYCOLAX) packet Take 17 g by mouth daily. Patient taking differently: Take 17 g by mouth 2 (two) times daily.  08/02/15  Yes Adrian SaranSital Mody, MD  ranolazine (RANEXA) 500 MG 12 hr tablet Take 1 tablet (500 mg total) by mouth 2 (two) times daily. 07/11/15  Yes Auburn BilberryShreyang Patel, MD  risperiDONE (RISPERDAL) 1 MG tablet Take 1 mg by mouth 3 (three) times daily.    Yes Historical Provider, MD  senna (SENOKOT) 8.6 MG TABS tablet Take 2 tablets by mouth at bedtime as needed for mild constipation.   Yes Historical Provider, MD  torsemide (DEMADEX) 100 MG tablet Take 100 mg by mouth daily.   Yes Historical Provider, MD      DRUG ALLERGIES: Allergies  Allergen Reactions  . Cephalosporins Anaphylaxis  . Penicillins Anaphylaxis and Other (See Comments)    Has patient had a PCN reaction causing immediate rash, facial/tongue/throat swelling,  SOB or lightheadedness with hypotension: Yes Has patient had a PCN reaction causing severe rash involving mucus membranes or skin necrosis: No Has patient had a PCN reaction that required hospitalization No Has patient had a PCN reaction occurring within the last 10 years: No If all of the above answers are "NO", then may proceed with Cephalosporin use.  . Lamictal [Lamotrigine] Other (See Comments)    Reaction:  Hallucinations  . Phenergan [Promethazine Hcl] Nausea And Vomiting  . Pravastatin Other (See Comments)    Reaction:  Muscle pain   . Sulfa Antibiotics Other (See Comments)    Reaction:  Unknown      REVIEW OF SYSTEMS: CONSTITUTIONAL: No fever/chills, fatigue, weakness, weight gain/loss, headache EYES: No blurry or double vision. ENT: No tinnitus, postnasal drip, redness or soreness of the oropharynx. RESPIRATORY: No cough, wheeze, hemoptysis, dyspnea. CARDIOVASCULAR: Positive chest pain, negative orthopnea, palpitations, syncope. GASTROINTESTINAL: No nausea, vomiting, constipation, diarrhea, abdominal pain, hematemesis, melena or hematochezia. GENITOURINARY: No dysuria or hematuria. ENDOCRINE: No polyuria or nocturia. No heat or cold intolerance. HEMATOLOGY: No anemia, bruising, bleeding. INTEGUMENTARY: No rashes, ulcers, lesions. MUSCULOSKELETAL: No arthritis, swelling, gout. NEUROLOGIC: No numbness, tingling, weakness or ataxia. No seizure-type activity. PSYCHIATRIC: No anxiety, depression, insomnia.  PHYSICAL EXAMINATION: VITAL SIGNS: Blood pressure (!) 186/63, pulse 73, temperature 98.2 F (36.8 C), temperature source Oral, resp. rate 14, height 5\' 9"  (1.753 m), weight 114.4 kg (252 lb 3.3 oz), last menstrual period 03/16/2015, SpO2 100 %.  GENERAL: 50 y.o.-year-old white female patient, well-developed, well-nourished lying in the bed in no acute distress.  Pleasant and cooperative.   HEENT: Pale. Head atraumatic, normocephalic. Pupils equal, round, reactive to light  and accommodation. No scleral icterus. Extraocular muscles intact. Nares are patent. Oropharynx is clear. Mucus membranes moist. NECK: Supple, full range of motion. No JVD, no bruit heard. No thyroid enlargement, no tenderness, no cervical lymphadenopathy. CHEST: Normal breath sounds bilaterally. No wheezing, rales, rhonchi or crackles. No use of accessory muscles of respiration.  No reproducible chest wall tenderness.  CARDIOVASCULAR: S1, S2 normal. No murmurs, rubs, or gallops. Cap refill <2 seconds. ABDOMEN: Soft, nontender, nondistended. No rebound, guarding, rigidity. Normoactive bowel sounds present in all four quadrants. No organomegaly or mass. EXTREMITIES: Full range of motion. No pedal edema, cyanosis, or clubbing. NEUROLOGIC: Cranial nerves II through XII are grossly intact with no focal sensorimotor deficit. Muscle strength 5/5 in all extremities. Sensation intact. Gait not checked. PSYCHIATRIC: The patient is alert and oriented x 3. Normal affect, mood, thought content.  SKIN: Warm, dry, and intact without obvious rash, lesion, or ulcer.  LABORATORY PANEL:  CBC  Recent Labs Lab 11/18/15 2133  WBC 5.9  HGB 9.5*  HCT 27.6*  PLT 177   ----------------------------------------------------------------------------------------------------------------- Chemistries  Recent Labs Lab 11/18/15 2133  NA 139  K 3.2*  CL 102  CO2 34*  GLUCOSE 148*  BUN 15  CREATININE 2.90*  CALCIUM 9.1   ------------------------------------------------------------------------------------------------------------------ Cardiac Enzymes  Recent Labs Lab 11/18/15 2133  TROPONINI 0.07*   ------------------------------------------------------------------------------------------------------------------  RADIOLOGY: Dg Chest 2 View  Result Date: 11/18/2015 CLINICAL DATA:  Acute onset of left-sided chest pain and shortness of breath. Nausea. Initial encounter. EXAM: CHEST  2 VIEW COMPARISON:  Chest  radiograph from 10/05/2015 FINDINGS: The lungs are well-aerated. Vascular congestion is noted. There is no evidence of focal opacification, pleural effusion or pneumothorax. The heart is borderline normal in size. No acute osseous abnormalities are seen. A right-sided dual-lumen catheter is noted ending about the mid to distal SVC. IMPRESSION: Vascular congestion noted.  Lungs remain grossly clear. Electronically Signed   By: Roanna Raider M.D.   On: 11/18/2015 21:02    EKG: Normal sinus rhythm at 89 bpm with nonspecific ST and T wave changes  IMPRESSION AND PLAN:  This is a 50 y.o. female with a history of coronary artery disease, CHF, diabetes, end-stage renal disease on dialysis now being admitted with: 1. Chest pain rule out acute coronary syndrome-patient with typical symptoms as well as an elevated troponin of 0.07 which may be related to end-stage renal disease however we will admit for observation to telemetry for monitoring, serial troponins, check lipids, TFTs and cardiology consultation in the morning. Patient is already on aspirin and Plavix, nitrates, lisinopril, LopressorChest x-ray shows vascular congestion the patient does not show any clinical signs of heart failure. 2. Diabetes, insulin-dependent will use sliding scale coverage during her hospitalization.. Otherwise we'll continue her regular home medications.   Diet/Nutrition: heart healthy, carb controlled Fluids: Hep-Lock DVT Px: Lovenox, SCDs and early ambulation Code Status: Full  All the records are reviewed and case discussed with ED provider. Management plans discussed with the patient and/or family who express understanding and agree with plan of care.   TOTAL TIME TAKING CARE OF THIS PATIENT: 60 minutes.   Kaliegh Willadsen D.O. on 11/19/2015 at 1:29 AM Between 7am to 6pm - Pager - [phone] After 6pm go to [email or web address] - Social research officer, government [facility] [place] [facility] Office [phone] CC:  Primary care physician; Kimberly Matin, MD     Note: This dictation was prepared with Dragon dictation along with smaller phrase technology. Any transcriptional errors that result from this process are unintentional.

## 2015-11-19 NOTE — Progress Notes (Signed)
Per Dr. Welton Flakes pt will need to undergo cardiac cath tomorrow. Pt already received daily PO medications including lisinopril. Dr. Welton Flakes ordered pt to be put on IVF (0.45% NS @ 142ml/hr) starting now in order to support renal function before receiving dye for procedure tomorrow. Orders in epic added.

## 2015-11-19 NOTE — Progress Notes (Signed)
[facility] - [place] at [facility]   PATIENT NAME: Kimberly Montgomery    MR#:  [id number]  DATE OF BIRTH:  Apr 27, 1965  SUBJECTIVE:  CHIEF COMPLAINT:   Chief Complaint  Patient presents with  . Chest Pain   Patient is 50 year old Caucasian female with past medical history significant for history of coronary artery disease, status post cardiac catheterization in June 2017 with stent placement in mid LAD, end-stage renal disease, diabetes mellitus, hypertension, hyperlipidemia, gastric esophageal reflux disease, gastroparesis, who presents back to the hospital with complaints of midsternal chest pains, associated with shortness of breath and diaphoresis. Patient's troponin was mildly elevated, she still complains of chest pains. Troponin is mildly elevated at 0.12 at max  Review of Systems  Constitutional: Negative for chills, fever and weight loss.  HENT: Negative for congestion.   Eyes: Negative for blurred vision and double vision.  Respiratory: Positive for shortness of breath. Negative for cough, sputum production and wheezing.   Cardiovascular: Positive for chest pain. Negative for palpitations, orthopnea, leg swelling and PND.  Gastrointestinal: Negative for abdominal pain, blood in stool, constipation, diarrhea, nausea and vomiting.  Genitourinary: Negative for dysuria, frequency, hematuria and urgency.  Musculoskeletal: Negative for falls.  Neurological: Negative for dizziness, tremors, focal weakness and headaches.  Endo/Heme/Allergies: Does not bruise/bleed easily.  Psychiatric/Behavioral: Negative for depression. The patient does not have insomnia.     VITAL SIGNS: Blood pressure (!) 159/107, pulse 88, temperature 97.7 F (36.5 C), temperature source Oral, resp. rate 18, height 5\' 9"  (1.753 m), weight 115.3 kg (254 lb 4.8 oz), last menstrual period 03/16/2015, SpO2 94 %.  PHYSICAL EXAMINATION:   GENERAL:  50 y.o.-year-old patient lying in the bed with  no acute distress.  EYES: Pupils equal, round, reactive to light and accommodation. No scleral icterus. Extraocular muscles intact.  HEENT: Head atraumatic, normocephalic. Oropharynx and nasopharynx clear.  NECK:  Supple, no jugular venous distention. No thyroid enlargement, no tenderness.  LUNGS: Normal breath sounds bilaterally, no wheezing, rales,rhonchi or crepitation. No use of accessory muscles of respiration.  CARDIOVASCULAR: S1, S2 normal. No murmurs, rubs, or gallops.  ABDOMEN: Soft, nontender, nondistended. Bowel sounds present. No organomegaly or mass.  EXTREMITIES: Left lower extremity and pedal edema, no cyanosis, or clubbing. Right AKA NEUROLOGIC: Cranial nerves II through XII are intact. Muscle strength 5/5 in all extremities. Sensation intact. Gait not checked.  PSYCHIATRIC: The patient is alert and oriented x 3.  SKIN: No obvious rash, lesion, or ulcer.   ORDERS/RESULTS REVIEWED:   CBC  Recent Labs Lab 11/18/15 2133  WBC 5.9  HGB 9.5*  HCT 27.6*  PLT 177  MCV 92.9  MCH 32.0  MCHC 34.5  RDW 17.2*   ------------------------------------------------------------------------------------------------------------------  Chemistries   Recent Labs Lab 11/18/15 2133  NA 139  K 3.2*  CL 102  CO2 34*  GLUCOSE 148*  BUN 15  CREATININE 2.90*  CALCIUM 9.1   ------------------------------------------------------------------------------------------------------------------ estimated creatinine clearance is 30.8 mL/min (by C-G formula based on SCr of 2.9 mg/dL). ------------------------------------------------------------------------------------------------------------------ No results for input(s): TSH, T4TOTAL, T3FREE, THYROIDAB in the last 72 hours.  Invalid input(s): FREET3  Cardiac Enzymes  Recent Labs Lab 11/18/15 2133 11/19/15 0617 11/19/15 0936  TROPONINI 0.07* 0.11* 0.12*    ------------------------------------------------------------------------------------------------------------------ Invalid input(s): POCBNP ---------------------------------------------------------------------------------------------------------------  RADIOLOGY: Dg Chest 2 View  Result Date: 11/18/2015 CLINICAL DATA:  Acute onset of left-sided chest pain and shortness of breath. Nausea. Initial encounter. EXAM: CHEST  2 VIEW COMPARISON:  Chest radiograph from 10/05/2015 FINDINGS:  The lungs are well-aerated. Vascular congestion is noted. There is no evidence of focal opacification, pleural effusion or pneumothorax. The heart is borderline normal in size. No acute osseous abnormalities are seen. A right-sided dual-lumen catheter is noted ending about the mid to distal SVC. IMPRESSION: Vascular congestion noted.  Lungs remain grossly clear. Electronically Signed   By: Roanna RaiderJeffery  Chang M.D.   On: 11/18/2015 21:02    EKG:  Orders placed or performed during the hospital encounter of 11/18/15  . ED EKG within 10 minutes  . ED EKG within 10 minutes  . EKG 12-Lead (at 6am)  . EKG 12-Lead (Repeat cardiac markers, recurrent chest pain)  . EKG 12-Lead (at 6am)  . EKG 12-Lead (Repeat cardiac markers, recurrent chest pain)  . EKG 12-Lead  . EKG 12-Lead    ASSESSMENT AND PLAN:  Active Problems:   Chest pain, rule out acute myocardial infarction  #1. Unstable angina with elevated troponin, status post recent cardiac catheterization with mid LAD stent placement in June 2017 by Dr. Welton FlakesKhan, the patient was seen by a cardiologist today again and cardiac catheterization was recommended, initiate patient on heparin intravenously, continue Plavix, aspirin, metoprolol, the patient is apparently allergic to statin. Patient's LDL was found to be elevated at 115, triglycerides at 186, initiate WelChol #2. Essential hypertension, continue metoprolol, lisinopril, nitroglycerin topically, follow blood pressure  readings closely and advance medications as needed #3. Hyperlipidemia, initiate WelChol, patient is allergic to statin #4 anemia, get Hemoccult  #5. End-stage renal disease, get nephrologist involved  Management plans discussed with the patient, family and they are in agreement.   DRUG ALLERGIES:  Allergies  Allergen Reactions  . Cephalosporins Anaphylaxis  . Penicillins Anaphylaxis and Other (See Comments)    Has patient had a PCN reaction causing immediate rash, facial/tongue/throat swelling, SOB or lightheadedness with hypotension: Yes Has patient had a PCN reaction causing severe rash involving mucus membranes or skin necrosis: No Has patient had a PCN reaction that required hospitalization No Has patient had a PCN reaction occurring within the last 10 years: No If all of the above answers are "NO", then may proceed with Cephalosporin use.  . Lamictal [Lamotrigine] Other (See Comments)    Reaction:  Hallucinations  . Phenergan [Promethazine Hcl] Nausea And Vomiting  . Pravastatin Other (See Comments)    Reaction:  Muscle pain   . Sulfa Antibiotics Other (See Comments)    Reaction:  Unknown     CODE STATUS:     Code Status Orders        Start     Ordered   11/19/15 0118  Full code  Continuous     11/19/15 0117    Code Status History    Date Active Date Inactive Code Status Order ID Comments User Context   10/06/2015  2:08 AM 10/06/2015  6:32 PM Full Code [id number]  Ihor AustinPavan Pyreddy, MD Inpatient   09/13/2015  2:14 AM 09/15/2015  5:28 PM Full Code [id number]  Arnaldo NatalMichael S Diamond, MD Inpatient   08/24/2015  3:50 AM 08/31/2015  6:47 PM Full Code [id number]  Ihor AustinPavan Pyreddy, MD Inpatient   07/16/2015  6:26 AM 08/02/2015  6:48 PM Full Code [id number]  Ihor AustinPavan Pyreddy, MD ED   07/10/2015  1:49 AM 07/12/2015  1:01 PM Full Code [id number]  Oralia Manisavid Willis, MD Inpatient   05/12/2015  3:46 PM 05/16/2015  5:35 PM Full Code [id number]  Auburn BilberryShreyang Patel, MD ED      TOTAL TIME TAKING CARE OF THIS PATIENT: 35  minutes.    Katharina Caper M.D on 11/19/2015 at 1:54 PM  Between 7am to 6pm - Pager - [phone]  After 6pm go to [email or web address] - password [email or web address] [facility]  [place] [facility]  Office  [phone]  CC: Primary care physician; Jarome Matin, MD

## 2015-11-19 NOTE — Progress Notes (Signed)
Heparin rate change and bolus verified by [employer], RN

## 2015-11-19 NOTE — Care Management Obs Status (Signed)
MEDICARE OBSERVATION STATUS NOTIFICATION   Patient Details  Name: Kimberly Montgomery MRN: [id number] Date of Birth: 1965/10/25   Medicare Observation Status Notification Given:   (copy left with patient on contact precautions/ verbalized understanding  moon notice)    Caren Macadam, RN 11/19/2015, 7:05 PM

## 2015-11-19 NOTE — Care Management Obs Status (Signed)
MEDICARE OBSERVATION STATUS NOTIFICATION   Patient Details  Name: Kimberly Montgomery MRN: [id number] Date of Birth: 01/07/1966   Medicare Observation Status Notification Given:   (copy left with patient on contact precautions/ verbalized understanding  moon notice)    Michelle Anabelen Kaminsky, RN 11/19/2015, 7:04 PM 

## 2015-11-19 NOTE — Progress Notes (Signed)
A&O. Admitted from nursing home for chest pain. Treated for chest pain upon admission. Patient has a unilateral BKA. States she can get out of bed with assistance and with walker. Declined insulin dose during the night as she was afraid her glucose level would drop. Blood pressure has been high but has trended down. Slept the rest of the night.

## 2015-11-19 NOTE — NC FL2 (Signed)
[place] MEDICAID FL2 LEVEL OF CARE SCREENING TOOL     IDENTIFICATION  Patient Name: Kimberly AristaLaura L Casto Birthdate: 11/09/1965 Sex: female Admission Date (Current Location): 11/18/2015  [place]   Facility and Address:  [facility], [address], [place], [place] [address]      Provider Number: [id number]  Attending Physician Name and Address:  Katharina Caperima Vaickute, MD  Relative Name and Phone Number:       Current Level of Care: Hospital Recommended Level of Care: Skilled Nursing Facility Prior Approval Number:    Date Approved/Denied: 10/23/15 PASRR Number: [id number]  Discharge Plan: SNF    Current Diagnoses: Patient Active Problem List   Diagnosis Date Noted  . Chest pain, rule out acute myocardial infarction 11/18/2015  . Bipolar I disorder, most recent episode depressed (HCC)   . Altered mental status 08/24/2015  . ESRD (end stage renal disease) (HCC)   . Ileus (HCC)   . Bipolar I disorder (HCC) 07/25/2015  . Seizures (HCC) 07/25/2015  . Peritonitis (HCC) 07/16/2015  . Unstable angina (HCC) 07/09/2015  . ESRD on peritoneal dialysis (HCC) 07/09/2015  . Accelerated hypertension 07/09/2015  . Type 2 diabetes mellitus (HCC) 07/09/2015  . CAD (coronary artery disease) 07/09/2015  . HLD (hyperlipidemia) 07/09/2015  . GERD (gastroesophageal reflux disease) 07/09/2015  . Chest pain 05/12/2015    Orientation RESPIRATION BLADDER Height & Weight     Self, Time, Situation, Place  Normal Continent Weight: 254 lb 4.8 oz (115.3 kg) Height:  5\' 9"  (175.3 cm)  BEHAVIORAL SYMPTOMS/MOOD NEUROLOGICAL BOWEL NUTRITION STATUS      Continent    AMBULATORY STATUS COMMUNICATION OF NEEDS Skin   Total Care Verbally Normal                       Personal Care Assistance Level of Assistance  Bathing, Dressing Bathing Assistance: Limited assistance   Dressing Assistance: Limited assistance     Functional Limitations Info              SPECIAL CARE FACTORS FREQUENCY                       Contractures Contractures Info: Present    Additional Factors Info  Allergies   Allergies Info:  (CEPHALOSPORINS, PENICILLINS, LAMICTAL LAMOTRIGINE, PHENERGAN PROMETHAZINE HCL, PRAVASTATIN, SULFA ANTIBIOTICS)           Current Medications (11/19/2015):  This is the current hospital active medication list Current Facility-Administered Medications  Medication Dose Route Frequency Provider Last Rate Last Dose  . 0.45 % sodium chloride infusion   Intravenous Continuous Laurier NancyShaukat A Khan, MD 125 mL/hr at 11/19/15 1232    . acetaminophen (TYLENOL) tablet 650 mg  650 mg Oral Q4H PRN Katharina Caperima Vaickute, MD   650 mg at 11/19/15 1114  . bisacodyl (DULCOLAX) suppository 10 mg  10 mg Rectal Daily PRN Alexis Hugelmeyer, DO      . calcitRIOL (ROCALTROL) capsule 0.25 mcg  0.25 mcg Oral Daily Alexis Hugelmeyer, DO   0.25 mcg at 11/19/15 1101  . calcium acetate (PHOSLO) capsule 2,001 mg  2,001 mg Oral TID WC Alexis Hugelmeyer, DO   2,001 mg at 11/19/15 1214  . calcium carbonate (TUMS - dosed in mg elemental calcium) chewable tablet 200 mg of elemental calcium  1 tablet Oral TID WC Alexis Hugelmeyer, DO   200 mg of elemental calcium at 11/19/15 1214  . clopidogrel (PLAVIX) tablet 75 mg  75 mg Oral Daily Alexis Hugelmeyer, DO   75 mg at 11/19/15 1058  . colesevelam [facility]) tablet 625 mg  625 mg Oral BID WC Katharina Caper, MD      . divalproex (DEPAKOTE) DR tablet 1,000 mg  1,000 mg Oral TID Alexis Hugelmeyer, DO   1,000 mg at 11/19/15 1059  . docusate sodium (COLACE) capsule 100 mg  100 mg Oral QHS Alexis Hugelmeyer, DO      . erythromycin (E-MYCIN) tablet 250 mg  250 mg Oral QID Alexis Hugelmeyer, DO   250 mg at 11/19/15 1405  . heparin ADULT infusion 100 units/mL (25000 units/275mL sodium chloride 0.45%)  1,200 Units/hr Intravenous Continuous Rolm Baptise, RPH      . heparin bolus via infusion 4,000 Units  4,000 Units Intravenous  Once Rolm Baptise, RPH      . hydrALAZINE (APRESOLINE) injection 10 mg  10 mg Intravenous Q4H PRN Ihor Austin, MD   10 mg at 11/19/15 6045  . hydrALAZINE (APRESOLINE) tablet 25 mg  25 mg Oral QPM Alexis Hugelmeyer, DO      . insulin aspart (novoLOG) injection 0-24 Units  0-24 Units Subcutaneous Q4H Alexis Hugelmeyer, DO   4 Units at 11/19/15 [phone]  . insulin glargine (LANTUS) injection 8 Units  8 Units Subcutaneous QHS Alexis Hugelmeyer, DO      . isosorbide mononitrate (IMDUR) 24 hr tablet 60 mg  60 mg Oral Daily Alexis Hugelmeyer, DO   60 mg at 11/19/15 1059  . lactulose (CHRONULAC) 10 GM/15ML solution 10 g  10 g Oral Daily PRN Alexis Hugelmeyer, DO   10 g at 11/19/15 1049  . levETIRAcetam (KEPPRA) tablet 500 mg  500 mg Oral q morning - 10a Alexis Hugelmeyer, DO   500 mg at 11/19/15 1058  . lisinopril (PRINIVIL,ZESTRIL) tablet 40 mg  40 mg Oral Daily Alexis Hugelmeyer, DO   40 mg at 11/19/15 1057  . metoCLOPramide (REGLAN) tablet 10 mg  10 mg Oral QID Alexis Hugelmeyer, DO   10 mg at 11/19/15 1405  . metolazone (ZAROXOLYN) tablet 5 mg  5 mg Oral Daily PRN Alexis Hugelmeyer, DO      . metoprolol (LOPRESSOR) tablet 50 mg  50 mg Oral BID Alexis Hugelmeyer, DO   50 mg at 11/19/15 1058  . nicotine (NICODERM CQ - dosed in mg/24 hours) patch 14 mg  14 mg Transdermal Daily Alexis Hugelmeyer, DO   14 mg at 11/19/15 1104  . nitroGLYCERIN (NITROGLYN) 2 % ointment 1 inch  1 inch Topical Q6H Katharina Caper, MD   1 inch at 11/19/15 1214  . nitroGLYCERIN (NITROSTAT) SL tablet 0.4 mg  0.4 mg Sublingual Q5 min PRN Phineas Semen, MD   0.4 mg at 11/18/15 2222  . nystatin (MYCOSTATIN/NYSTOP) topical powder 1 g  1 g Topical BID Alexis Hugelmeyer, DO   1 g at 11/19/15 1100  . ondansetron (ZOFRAN-ODT) disintegrating tablet 4 mg  4 mg Oral Q6H PRN Alexis Hugelmeyer, DO      . oxyCODONE (Oxy IR/ROXICODONE) immediate release tablet 5 mg  5 mg Oral Q6H PRN Alexis Hugelmeyer, DO   5 mg at 11/19/15 0837  . pantoprazole  (PROTONIX) EC tablet 80 mg  80 mg Oral Daily Alexis Hugelmeyer, DO   80 mg at 11/19/15 1050  . polyethylene glycol (MIRALAX / GLYCOLAX) packet 17 g  17 g Oral BID Alexis Hugelmeyer, DO      . ranolazine (RANEXA) 12 hr tablet 500 mg  500 mg Oral BID Jon Gills  Hugelmeyer, DO   500 mg at 11/19/15 1057  . risperiDONE (RISPERDAL) tablet 1 mg  1 mg Oral TID Alexis Hugelmeyer, DO   1 mg at 11/19/15 1059  . senna (SENOKOT) tablet 17.2 mg  2 tablet Oral QHS PRN Alexis Hugelmeyer, DO      . torsemide (DEMADEX) tablet 100 mg  100 mg Oral Daily Alexis Hugelmeyer, DO   100 mg at 11/19/15 1050     Discharge Medications: Please see discharge summary for a list of discharge medications.  Relevant Imaging Results:  Relevant Lab Results:   Additional Information  [id number]   Judi Cong, LCSW

## 2015-11-19 NOTE — Progress Notes (Signed)
Anticoagulation monitoring(Lovenox):  50 yo  Female ordered Lovenox 30 mg Q24h  Filed Weights   11/18/15 2010  Weight: 252 lb 3.3 oz (114.4 kg)   BMI 37.3    Lab Results  Component Value Date   CREATININE 2.90 (H) 11/18/2015   CREATININE 3.15 (H) 10/06/2015   CREATININE 2.96 (H) 10/05/2015   Estimated Creatinine Clearance: 30.6 mL/min (by C-G formula based on SCr of 2.9 mg/dL). Hemoglobin & Hematocrit     Component Value Date/Time   HGB 9.5 (L) 11/18/2015 2133   HGB 11.5 (L) 07/17/2014 0417   HCT 27.6 (L) 11/18/2015 2133   HCT 35.2 07/17/2014 0417     Per Protocol for Patient with estCrcl > 30 ml/min and BMI >40, will transition to Lovenox 40 mg Q24h.

## 2015-11-19 NOTE — Progress Notes (Signed)
Notified Dr. Tobi Bastos of BP 183/80. PRN hydralazine ordered and given.

## 2015-11-19 NOTE — Clinical Social Work Note (Signed)
Clinical Social Work Assessment  Patient Details  Name: Kimberly Montgomery MRN: [id number] Date of Birth: [birth year]  Date of referral:  11/19/15               Reason for consult:  Facility Placement                Permission sought to share information with:  Facility Medical sales representative Permission granted to share information::  Yes, Verbal Permission Granted  Name::        Agency::  [facility]  Relationship::     Contact Information:  [phone]  Housing/Transportation Living arrangements for the past 2 months:  Skilled Nursing Facility Source of Information:  Patient Patient Interpreter Needed:  None Criminal Activity/Legal Involvement Pertinent to Current Situation/Hospitalization:  No - Comment as needed Significant Relationships:  Adult Children Lives with:  Facility Resident Do you feel safe going back to the place where you live?  Yes Need for family participation in patient care:  No (Coment)  Care giving concerns:  Patient admitted from facility [facility])   Social Worker assessment / plan:  Patient is alert and oriented x4. Patient is a resident of [facility] under Medicaid.  Patient, at baseline uses a wheelchair most often and a walker during physical therapy. Patient would like to return to [facility] upon dc. Patient is in the process of getting a new prosthetic leg. Patient would like a walker with a seat. Patient has limited assistance with dressing and bathing and is independent with feeding. Patient is fully continent.  Patient gave verbal permission to discuss care with [facility].  Employment status:  Retired Health and safety inspector:  Medicaid [place], [employer] PT Recommendations:  Skilled Nursing Facility Information / Referral to community resources:     Patient/Family's Response to care:  Patient pleasant and thanked CSW.  Patient/Family's Understanding of and Emotional Response to Diagnosis, Current Treatment, and Prognosis:  Patient able to  verbalize in her own terms the current treatment plan and potential dc plan.  Emotional Assessment Appearance:  Appears older than stated age Attitude/Demeanor/Rapport:   (Patient pleasant) Affect (typically observed):   (Patient appropriately apprehensive) Orientation:  Oriented to Self, Oriented to Place, Oriented to  Time, Oriented to Situation Alcohol / Substance use:  Never Used Psych involvement (Current and /or in the community):  No (Comment)  Discharge Needs  Concerns to be addressed:  Discharge Planning Concerns Readmission within the last 30 days:  No Current discharge risk:  Chronically ill Barriers to Discharge:  Continued Medical Work up   [employer], LCSW 11/19/2015, 2:15 PM

## 2015-11-19 NOTE — Progress Notes (Signed)
Pt is from a nursing facility and meets protocol to be screened for MRSA. Nasal swab was sent down to lab this AM, but per Morrie Sheldon in the lab an order was never placed so lab did not run test. Order has now been placed and Morrie Sheldon states she will run test.

## 2015-11-19 NOTE — ED Notes (Signed)
Notified bed ready att; this RN in triage until 0038 for behavioral pt triage

## 2015-11-19 NOTE — Progress Notes (Signed)
ANTICOAGULATION CONSULT NOTE - Initial Consult  Pharmacy Consult for heparin bolus and drip Indication: chest pain/ACS  Allergies  Allergen Reactions  . Cephalosporins Anaphylaxis  . Penicillins Anaphylaxis and Other (See Comments)    Has patient had a PCN reaction causing immediate rash, facial/tongue/throat swelling, SOB or lightheadedness with hypotension: Yes Has patient had a PCN reaction causing severe rash involving mucus membranes or skin necrosis: No Has patient had a PCN reaction that required hospitalization No Has patient had a PCN reaction occurring within the last 10 years: No If all of the above answers are "NO", then may proceed with Cephalosporin use.  . Lamictal [Lamotrigine] Other (See Comments)    Reaction:  Hallucinations  . Phenergan [Promethazine Hcl] Nausea And Vomiting  . Pravastatin Other (See Comments)    Reaction:  Muscle pain   . Sulfa Antibiotics Other (See Comments)    Reaction:  Unknown     Patient Measurements: Height: 5\' 9"  (175.3 cm) Weight: 254 lb 4.8 oz (115.3 kg) IBW/kg (Calculated) : 66.2 Heparin Dosing Weight: 92.2 kg  Vital Signs: Temp: 97.7 F (36.5 C) (08/20 1137) Temp Source: Oral (08/20 1137) BP: 113/57 (08/20 1405) Pulse Rate: 88 (08/20 1137)  Labs:  Recent Labs  11/18/15 2133 11/19/15 0617 11/19/15 0936 11/19/15 1421 11/19/15 1547 11/19/15 2042  HGB 9.5*  --   --   --   --   --   HCT 27.6*  --   --   --   --   --   PLT 177  --   --   --   --   --   APTT  --   --   --  25  --   --   LABPROT  --   --   --  12.8  --   --   INR  --   --   --  0.96  --   --   HEPARINUNFRC  --   --   --   --   --  0.21*  CREATININE 2.90*  --   --   --   --   --   TROPONINI 0.07* 0.11* 0.12*  --  0.13*  --     Estimated Creatinine Clearance: 30.8 mL/min (by C-G formula based on SCr of 2.9 mg/dL).   Medical History: Past Medical History:  Diagnosis Date  . Asthma   . CAD (coronary artery disease)   . CHF (congestive heart failure)  (HCC)   . Diabetes mellitus without complication (HCC)   . Endometriosis   . ESRD (end stage renal disease) (HCC)   . Gastroparesis   . GERD (gastroesophageal reflux disease)   . HLD (hyperlipidemia)   . Hypertension   . Renal disorder     Medications:  Scheduled:  . [START ON 11/20/2015] aspirin  81 mg Oral Pre-Cath  . calcitRIOL  0.25 mcg Oral Daily  . calcium acetate  2,001 mg Oral TID WC  . calcium carbonate  1 tablet Oral TID WC  . [START ON 11/20/2015] Chlorhexidine Gluconate Cloth  6 each Topical Q0600  . clopidogrel  75 mg Oral Daily  . colesevelam  625 mg Oral BID WC  . divalproex  1,000 mg Oral TID  . docusate sodium  100 mg Oral QHS  . erythromycin  250 mg Oral QID  . heparin  1,400 Units Intravenous Once  . hydrALAZINE  50 mg Oral Q6H  . insulin aspart  0-24 Units Subcutaneous Q4H  .  insulin glargine  8 Units Subcutaneous QHS  . isosorbide mononitrate  60 mg Oral Daily  . levETIRAcetam  500 mg Oral q morning - 10a  . metoCLOPramide  10 mg Oral QID  . metoprolol  50 mg Oral BID  . mupirocin ointment  1 application Nasal BID  . nicotine  14 mg Transdermal Daily  . nitroGLYCERIN  1 inch Topical Q6H  . nystatin  1 g Topical BID  . pantoprazole  80 mg Oral Daily  . polyethylene glycol  17 g Oral BID  . ranolazine  500 mg Oral BID  . risperiDONE  1 mg Oral TID  . sodium chloride flush  3 mL Intravenous Q12H   Infusions:  . sodium chloride 125 mL/hr at 11/19/15 1232  . [START ON 11/20/2015] sodium chloride     Followed by  . [START ON 11/20/2015] sodium chloride    . heparin 1,200 Units/hr (11/19/15 1453)    Assessment: Pharmacy has been consulted to dose heparin bolus and drip in this 50 yoF for chest pain/ACS. Per med rec, patient was not taking anticoagulat PTA nor was administered anticoagulant during admission prior to heparin. Baseline labs were ordered. HL ordered for 6 hours after administration (08/20 @ 2100). CBC ordered with 08/21 AM labs.  Goal of  Therapy:  Heparin level 0.3-0.7 units/ml Monitor platelets by anticoagulation protocol: Yes   Plan:  Give 4000 units bolus x 1 Start heparin infusion at 1200 units/hr Check anti-Xa level in 6 hours and daily while on heparin Continue to monitor H&H and platelets   8/20:  HL @ 20:42 = 0.21 Will order Heparin 1400 units IV X 1 and increase drip rate to 1400 units/hr. Will order HL 6 hrs after rate change.  Logann Whitebread D 11/19/2015 9:25 PM

## 2015-11-19 NOTE — Progress Notes (Signed)
Kimberly Montgomery is a 50 y.o. female  [id number]  Primary Cardiologist: Adrian Blackwater Reason for Consultation: Chest pain  HPI: This is a 50 year old white female with a past medical history of coronary artery disease status post PCI and stenting of the LAD presented again with chest pain associated with shortness of breath and diaphoresis.   Review of Systems: No orthopnea PND or leg swelling.   Past Medical History:  Diagnosis Date  . Asthma   . CAD (coronary artery disease)   . CHF (congestive heart failure) (HCC)   . Diabetes mellitus without complication (HCC)   . Endometriosis   . ESRD (end stage renal disease) (HCC)   . Gastroparesis   . GERD (gastroesophageal reflux disease)   . HLD (hyperlipidemia)   . Hypertension   . Renal disorder     Medications Prior to Admission  Medication Sig Dispense Refill  . bisacodyl (DULCOLAX) 10 MG suppository Place 10 mg rectally daily as needed for moderate constipation. Use if no relief from Milk of Magnesia    . Bismuth Tribromoph-Petrolatum (XEROFORM PETROLATUM DRESSING) PADS Apply 1 each topically every 3 (three) days.    . calcitRIOL (ROCALTROL) 0.25 MCG capsule Take 0.25 mcg by mouth daily.    . calcium acetate (PHOSLO) 667 MG capsule Take 3 capsules (2,001 mg total) by mouth 3 (three) times daily with meals. 90 capsule 0  . calcium carbonate (TUMS - DOSED IN MG ELEMENTAL CALCIUM) 500 MG chewable tablet Chew 1 tablet (200 mg of elemental calcium total) by mouth 3 (three) times daily with meals. 120 tablet 0  . clopidogrel (PLAVIX) 75 MG tablet Take 75 mg by mouth daily.    . divalproex (DEPAKOTE) 500 MG DR tablet Take 1,000 mg by mouth 3 (three) times daily.    [name] docusate sodium (COLACE) 100 MG capsule Take 100 mg by mouth at bedtime.     [name] erythromycin (E-MYCIN) 250 MG tablet Take 250 mg by mouth 4 (four) times daily.    . hydrALAZINE (APRESOLINE) 25 MG tablet Take 25 mg by mouth every evening.    . insulin aspart (NOVOLOG) 100  UNIT/ML injection Inject 0-9 Units into the skin 3 (three) times daily with meals. 15 units three times a day with meals, 5 units with snacks    . insulin glargine (LANTUS) 100 UNIT/ML injection Inject 0.08 mLs (8 Units total) into the skin at bedtime. 10 mL 11  . isosorbide mononitrate (IMDUR) 60 MG 24 hr tablet Take 1 tablet (60 mg total) by mouth daily. 30 tablet 00  . lactulose (CHRONULAC) 10 GM/15ML solution Take 10 g by mouth daily as needed for mild constipation.    . levETIRAcetam (KEPPRA) 500 MG tablet Take 1 tablet (500 mg total) by mouth every morning. 30 tablet 0  . lisinopril (PRINIVIL,ZESTRIL) 40 MG tablet Take 40 mg by mouth daily.  3  . metoCLOPramide (REGLAN) 10 MG tablet Take 1 tablet (10 mg total) by mouth 4 (four) times daily. 90 tablet 0  . metolazone (ZAROXOLYN) 5 MG tablet Take 5 mg by mouth daily as needed (fluid).    . metoprolol (LOPRESSOR) 50 MG tablet Take 1 tablet (50 mg total) by mouth 2 (two) times daily.    . nicotine (NICODERM CQ - DOSED IN MG/24 HOURS) 14 mg/24hr patch Place 14 mg onto the skin daily.    . nitroGLYCERIN (NITROSTAT) 0.4 MG SL tablet Place 0.4 mg under the tongue every 5 (five) minutes as needed for chest  pain. Reported on 07/10/2015    . nystatin (MYCOSTATIN) powder Apply 1 g topically 2 (two) times daily.    [name] omeprazole (PRILOSEC) 20 MG capsule Take 40 mg by mouth 2 (two) times daily before a meal.    . ondansetron (ZOFRAN ODT) 4 MG disintegrating tablet Take 1 tablet (4 mg total) by mouth every 6 (six) hours as needed for nausea or vomiting. 20 tablet 0  . oxyCODONE (OXY IR/ROXICODONE) 5 MG immediate release tablet Take 1 tablet (5 mg total) by mouth every 6 (six) hours as needed for severe pain. 20 tablet 0  . polyethylene glycol (MIRALAX / GLYCOLAX) packet Take 17 g by mouth daily. (Patient taking differently: Take 17 g by mouth 2 (two) times daily. ) 14 each 0  . ranolazine (RANEXA) 500 MG 12 hr tablet Take 1 tablet (500 mg total) by mouth 2  (two) times daily. 60 tablet 0  . risperiDONE (RISPERDAL) 1 MG tablet Take 1 mg by mouth 3 (three) times daily.     [name] senna (SENOKOT) 8.6 MG TABS tablet Take 2 tablets by mouth at bedtime as needed for mild constipation.    . torsemide (DEMADEX) 100 MG tablet Take 100 mg by mouth daily.       . calcitRIOL  0.25 mcg Oral Daily  . calcium acetate  2,001 mg Oral TID WC  . calcium carbonate  1 tablet Oral TID WC  . clopidogrel  75 mg Oral Daily  . divalproex  1,000 mg Oral TID  . docusate sodium  100 mg Oral QHS  . enoxaparin (LOVENOX) injection  40 mg Subcutaneous Q24H  . erythromycin  250 mg Oral QID  . hydrALAZINE  25 mg Oral QPM  . insulin aspart  0-24 Units Subcutaneous Q4H  . insulin glargine  8 Units Subcutaneous QHS  . isosorbide mononitrate  60 mg Oral Daily  . levETIRAcetam  500 mg Oral q morning - 10a  . lisinopril  40 mg Oral Daily  . metoCLOPramide  10 mg Oral QID  . metoprolol  50 mg Oral BID  . nicotine  14 mg Transdermal Daily  . nitroGLYCERIN  1 inch Topical Q6H  . nystatin  1 g Topical BID  . pantoprazole  80 mg Oral Daily  . polyethylene glycol  17 g Oral BID  . ranolazine  500 mg Oral BID  . risperiDONE  1 mg Oral TID  . torsemide  100 mg Oral Daily    Infusions:    Allergies  Allergen Reactions  . Cephalosporins Anaphylaxis  . Penicillins Anaphylaxis and Other (See Comments)    Has patient had a PCN reaction causing immediate rash, facial/tongue/throat swelling, SOB or lightheadedness with hypotension: Yes Has patient had a PCN reaction causing severe rash involving mucus membranes or skin necrosis: No Has patient had a PCN reaction that required hospitalization No Has patient had a PCN reaction occurring within the last 10 years: No If all of the above answers are "NO", then may proceed with Cephalosporin use.  . Lamictal [Lamotrigine] Other (See Comments)    Reaction:  Hallucinations  . Phenergan [Promethazine Hcl] Nausea And Vomiting  . Pravastatin  Other (See Comments)    Reaction:  Muscle pain   . Sulfa Antibiotics Other (See Comments)    Reaction:  Unknown     Social History   Social History  . Marital status: Widowed    Spouse name: N/A  . Number of children: N/A  . Years of education: N/A  Occupational History  . disabled    Social History Main Topics  . Smoking status: Former Smoker    Packs/day: 0.50    Types: Cigarettes  . Smokeless tobacco: Not on file  . Alcohol use No  . Drug use: No  . Sexual activity: No   Other Topics Concern  . Not on file   Social History Narrative  . No narrative on file    Family History  Problem Relation Age of Onset  . CAD    . Diabetes    . Bipolar disorder    . Cervical cancer Mother     PHYSICAL EXAM: Vitals:   11/19/15 1137 11/19/15 1139  BP: (!) 186/79 (!) 159/107  Pulse: 88   Resp: 18   Temp: 97.7 F (36.5 C)      Intake/Output Summary (Last 24 hours) at 11/19/15 1208 Last data filed at 11/19/15 0900  Gross per 24 hour  Intake              240 ml  Output              100 ml  Net              140 ml    General:  Well appearing. No respiratory difficulty HEENT: normal Neck: supple. no JVD. Carotids 2+ bilat; no bruits. No lymphadenopathy or thryomegaly appreciated. Cor: PMI nondisplaced. Regular rate & rhythm. No rubs, gallops or murmurs. Lungs: clear Abdomen: soft, nontender, nondistended. No hepatosplenomegaly. No bruits or masses. Good bowel sounds. Extremities: no cyanosis, clubbing, rash, edema Neuro: alert & oriented x 3, cranial nerves grossly intact. moves all 4 extremities w/o difficulty. Affect pleasant.  ECG: Normal sinus rhythm no acute changes  Results for orders placed or performed during the hospital encounter of 11/18/15 (from the past 24 hour(s))  Basic metabolic panel     Status: Abnormal   Collection Time: 11/18/15  9:33 PM  Result Value Ref Range   Sodium 139 135 - 145 mmol/L   Potassium 3.2 (L) 3.5 - 5.1 mmol/L   Chloride  102 101 - 111 mmol/L   CO2 34 (H) 22 - 32 mmol/L   Glucose, Bld 148 (H) 65 - 99 mg/dL   BUN 15 6 - 20 mg/dL   Creatinine, Ser 1.61 (H) 0.44 - 1.00 mg/dL   Calcium 9.1 8.9 - 09.6 mg/dL   GFR calc non Af Amer 18 (L) >60 mL/min   GFR calc Af Amer 20 (L) >60 mL/min   Anion gap 3 (L) 5 - 15  CBC     Status: Abnormal   Collection Time: 11/18/15  9:33 PM  Result Value Ref Range   WBC 5.9 3.6 - 11.0 K/uL   RBC 2.97 (L) 3.80 - 5.20 MIL/uL   Hemoglobin 9.5 (L) 12.0 - 16.0 g/dL   HCT 04.5 (L) 40.9 - 81.1 %   MCV 92.9 80.0 - 100.0 fL   MCH 32.0 26.0 - 34.0 pg   MCHC 34.5 32.0 - 36.0 g/dL   RDW 91.4 (H) 78.2 - 95.6 %   Platelets 177 150 - 440 K/uL  Troponin I     Status: Abnormal   Collection Time: 11/18/15  9:33 PM  Result Value Ref Range   Troponin I 0.07 (HH) <0.03 ng/mL  Glucose, capillary     Status: Abnormal   Collection Time: 11/19/15  1:23 AM  Result Value Ref Range   Glucose-Capillary 184 (H) 65 - 99 mg/dL   Comment 1  Notify RN    Comment 2 Document in Chart   Glucose, capillary     Status: Abnormal   Collection Time: 11/19/15  4:12 AM  Result Value Ref Range   Glucose-Capillary 185 (H) 65 - 99 mg/dL   Comment 1 Notify RN    Comment 2 Document in Chart   Troponin I-serum (0, 3, 6 hours)     Status: Abnormal   Collection Time: 11/19/15  6:17 AM  Result Value Ref Range   Troponin I 0.11 (HH) <0.03 ng/mL  Lipid panel     Status: Abnormal   Collection Time: 11/19/15  6:17 AM  Result Value Ref Range   Cholesterol 215 (H) 0 - 200 mg/dL   Triglycerides 409186 (H) <150 mg/dL   HDL 63 >81>40 mg/dL   Total CHOL/HDL Ratio 3.4 RATIO   VLDL 37 0 - 40 mg/dL   LDL Cholesterol 191115 (H) 0 - 99 mg/dL  Troponin I-serum (0, 3, 6 hours)     Status: Abnormal   Collection Time: 11/19/15  9:36 AM  Result Value Ref Range   Troponin I 0.12 (HH) <0.03 ng/mL  Glucose, capillary     Status: Abnormal   Collection Time: 11/19/15 11:33 AM  Result Value Ref Range   Glucose-Capillary 105 (H) 65 - 99  mg/dL   Dg Chest 2 View  Result Date: 11/18/2015 CLINICAL DATA:  Acute onset of left-sided chest pain and shortness of breath. Nausea. Initial encounter. EXAM: CHEST  2 VIEW COMPARISON:  Chest radiograph from 10/05/2015 FINDINGS: The lungs are well-aerated. Vascular congestion is noted. There is no evidence of focal opacification, pleural effusion or pneumothorax. The heart is borderline normal in size. No acute osseous abnormalities are seen. A right-sided dual-lumen catheter is noted ending about the mid to distal SVC. IMPRESSION: Vascular congestion noted.  Lungs remain grossly clear. Electronically Signed   By: Roanna RaiderJeffery  Chang M.D.   On: 11/18/2015 21:02     ASSESSMENT AND PLAN:Mildly elevated troponin most likely non-STEMI with history of coronary artery disease status post PCI and stenting of the LAD. Advise cardiac catheterization.  Juanantonio Stolar A

## 2015-11-20 ENCOUNTER — Encounter
Admission: EM | Disposition: A | Discharge status: Skilled Nursing Facility | Payer: Self-pay | Source: Home / Self Care | Attending: Internal Medicine

## 2015-11-20 ENCOUNTER — Observation Stay: Payer: Medicare HMO

## 2015-11-20 ENCOUNTER — Encounter: Payer: Self-pay | Admitting: Cardiovascular Disease

## 2015-11-20 DIAGNOSIS — K3184 Gastroparesis: Secondary | ICD-10-CM | POA: Diagnosis present

## 2015-11-20 DIAGNOSIS — I214 Non-ST elevation (NSTEMI) myocardial infarction: Secondary | ICD-10-CM | POA: Diagnosis present

## 2015-11-20 DIAGNOSIS — K219 Gastro-esophageal reflux disease without esophagitis: Secondary | ICD-10-CM | POA: Diagnosis present

## 2015-11-20 DIAGNOSIS — Z8249 Family history of ischemic heart disease and other diseases of the circulatory system: Secondary | ICD-10-CM | POA: Diagnosis not present

## 2015-11-20 DIAGNOSIS — D631 Anemia in chronic kidney disease: Secondary | ICD-10-CM | POA: Diagnosis present

## 2015-11-20 DIAGNOSIS — Z79899 Other long term (current) drug therapy: Secondary | ICD-10-CM | POA: Diagnosis not present

## 2015-11-20 DIAGNOSIS — N2581 Secondary hyperparathyroidism of renal origin: Secondary | ICD-10-CM | POA: Diagnosis present

## 2015-11-20 DIAGNOSIS — E1122 Type 2 diabetes mellitus with diabetic chronic kidney disease: Secondary | ICD-10-CM | POA: Diagnosis present

## 2015-11-20 DIAGNOSIS — R079 Chest pain, unspecified: Secondary | ICD-10-CM | POA: Diagnosis present

## 2015-11-20 DIAGNOSIS — Z833 Family history of diabetes mellitus: Secondary | ICD-10-CM | POA: Diagnosis not present

## 2015-11-20 DIAGNOSIS — Z888 Allergy status to other drugs, medicaments and biological substances status: Secondary | ICD-10-CM | POA: Diagnosis not present

## 2015-11-20 DIAGNOSIS — I2510 Atherosclerotic heart disease of native coronary artery without angina pectoris: Secondary | ICD-10-CM | POA: Diagnosis present

## 2015-11-20 DIAGNOSIS — E1143 Type 2 diabetes mellitus with diabetic autonomic (poly)neuropathy: Secondary | ICD-10-CM | POA: Diagnosis present

## 2015-11-20 DIAGNOSIS — Z992 Dependence on renal dialysis: Secondary | ICD-10-CM | POA: Diagnosis not present

## 2015-11-20 DIAGNOSIS — Z7982 Long term (current) use of aspirin: Secondary | ICD-10-CM | POA: Diagnosis not present

## 2015-11-20 DIAGNOSIS — Z955 Presence of coronary angioplasty implant and graft: Secondary | ICD-10-CM | POA: Diagnosis not present

## 2015-11-20 DIAGNOSIS — N186 End stage renal disease: Secondary | ICD-10-CM | POA: Diagnosis present

## 2015-11-20 DIAGNOSIS — J9601 Acute respiratory failure with hypoxia: Secondary | ICD-10-CM | POA: Diagnosis not present

## 2015-11-20 DIAGNOSIS — Z880 Allergy status to penicillin: Secondary | ICD-10-CM | POA: Diagnosis not present

## 2015-11-20 DIAGNOSIS — I132 Hypertensive heart and chronic kidney disease with heart failure and with stage 5 chronic kidney disease, or end stage renal disease: Secondary | ICD-10-CM | POA: Diagnosis present

## 2015-11-20 DIAGNOSIS — Z89519 Acquired absence of unspecified leg below knee: Secondary | ICD-10-CM | POA: Diagnosis not present

## 2015-11-20 DIAGNOSIS — Z794 Long term (current) use of insulin: Secondary | ICD-10-CM | POA: Diagnosis not present

## 2015-11-20 DIAGNOSIS — Z882 Allergy status to sulfonamides status: Secondary | ICD-10-CM | POA: Diagnosis not present

## 2015-11-20 DIAGNOSIS — I5031 Acute diastolic (congestive) heart failure: Secondary | ICD-10-CM | POA: Diagnosis present

## 2015-11-20 DIAGNOSIS — Z87891 Personal history of nicotine dependence: Secondary | ICD-10-CM | POA: Diagnosis not present

## 2015-11-20 HISTORY — PX: CARDIAC CATHETERIZATION: SHX172

## 2015-11-20 LAB — BASIC METABOLIC PANEL WITH GFR
Anion gap: 2 — ABNORMAL LOW (ref 5–15)
BUN: 23 mg/dL — ABNORMAL HIGH (ref 6–20)
CO2: 32 mmol/L (ref 22–32)
Calcium: 8.8 mg/dL — ABNORMAL LOW (ref 8.9–10.3)
Chloride: 100 mmol/L — ABNORMAL LOW (ref 101–111)
Creatinine, Ser: 3.77 mg/dL — ABNORMAL HIGH (ref 0.44–1.00)
GFR calc Af Amer: 15 mL/min — ABNORMAL LOW
GFR calc non Af Amer: 13 mL/min — ABNORMAL LOW
Glucose, Bld: 118 mg/dL — ABNORMAL HIGH (ref 65–99)
Potassium: 3.3 mmol/L — ABNORMAL LOW (ref 3.5–5.1)
Sodium: 134 mmol/L — ABNORMAL LOW (ref 135–145)

## 2015-11-20 LAB — GLUCOSE, CAPILLARY
GLUCOSE-CAPILLARY: 105 mg/dL — AB (ref 65–99)
GLUCOSE-CAPILLARY: 156 mg/dL — AB (ref 65–99)
GLUCOSE-CAPILLARY: 166 mg/dL — AB (ref 65–99)
Glucose-Capillary: 122 mg/dL — ABNORMAL HIGH (ref 65–99)
Glucose-Capillary: 123 mg/dL — ABNORMAL HIGH (ref 65–99)
Glucose-Capillary: 97 mg/dL (ref 65–99)

## 2015-11-20 LAB — THYROID PANEL WITH TSH
FREE THYROXINE INDEX: 1.3 (ref 1.2–4.9)
T3 Uptake Ratio: 27 % (ref 24–39)
T4, Total: 4.8 ug/dL (ref 4.5–12.0)
TSH: 8.57 u[IU]/mL — AB (ref 0.450–4.500)

## 2015-11-20 LAB — CBC
HEMATOCRIT: 22.5 % — AB (ref 35.0–47.0)
Hemoglobin: 7.6 g/dL — ABNORMAL LOW (ref 12.0–16.0)
MCH: 32.4 pg (ref 26.0–34.0)
MCHC: 33.6 g/dL (ref 32.0–36.0)
MCV: 96.2 fL (ref 80.0–100.0)
PLATELETS: 153 10*3/uL (ref 150–440)
RBC: 2.34 MIL/uL — ABNORMAL LOW (ref 3.80–5.20)
RDW: 17.1 % — AB (ref 11.5–14.5)
WBC: 5.9 10*3/uL (ref 3.6–11.0)

## 2015-11-20 LAB — HEPARIN LEVEL (UNFRACTIONATED): Heparin Unfractionated: 0.27 [IU]/mL — ABNORMAL LOW (ref 0.30–0.70)

## 2015-11-20 SURGERY — LEFT HEART CATH AND CORONARY ANGIOGRAPHY
Anesthesia: Moderate Sedation | Laterality: Right

## 2015-11-20 MED ORDER — ALBUTEROL SULFATE (2.5 MG/3ML) 0.083% IN NEBU
INHALATION_SOLUTION | RESPIRATORY_TRACT | Status: AC
  Administered 2015-11-20: 09:00:00
  Filled 2015-11-20: qty 3

## 2015-11-20 MED ORDER — ONDANSETRON HCL 4 MG/2ML IJ SOLN: 4.0000 mg | Freq: Four times a day (QID) | INTRAMUSCULAR | Status: DC | PRN

## 2015-11-20 MED ORDER — ACETAMINOPHEN 325 MG PO TABS: 650.0000 mg | ORAL_TABLET | ORAL | Status: DC | PRN

## 2015-11-20 MED ORDER — SODIUM CHLORIDE 0.9 % IV SOLN: 250.0000 mL | INTRAVENOUS | Status: DC | PRN

## 2015-11-20 MED ORDER — IOPAMIDOL (ISOVUE-300) INJECTION 61%
INTRAVENOUS | Status: DC | PRN
  Administered 2015-11-20: 40 mL via INTRA_ARTERIAL

## 2015-11-20 MED ORDER — FUROSEMIDE 10 MG/ML IJ SOLN
40.0000 mg | Freq: Once | INTRAMUSCULAR | Status: AC
  Administered 2015-11-20: 40 mg via INTRAVENOUS
  Filled 2015-11-20: qty 4

## 2015-11-20 MED ORDER — FENTANYL CITRATE (PF) 100 MCG/2ML IJ SOLN
INTRAMUSCULAR | Status: AC
  Filled 2015-11-20: qty 2

## 2015-11-20 MED ORDER — SODIUM CHLORIDE 0.9% FLUSH
3.0000 mL | Freq: Two times a day (BID) | INTRAVENOUS | Status: DC
  Administered 2015-11-20 – 2015-11-21 (×2): 3 mL via INTRAVENOUS

## 2015-11-20 MED ORDER — HEPARIN (PORCINE) IN NACL 2-0.9 UNIT/ML-% IJ SOLN
INTRAMUSCULAR | Status: AC
Start: 2015-11-20 — End: 2015-11-20
  Filled 2015-11-20: qty 1000

## 2015-11-20 MED ORDER — SODIUM CHLORIDE 0.9% FLUSH
3.0000 mL | Freq: Two times a day (BID) | INTRAVENOUS | Status: DC
  Administered 2015-11-20: 3 mL via INTRAVENOUS

## 2015-11-20 MED ORDER — ALBUTEROL SULFATE (2.5 MG/3ML) 0.083% IN NEBU
2.5000 mg | INHALATION_SOLUTION | Freq: Once | RESPIRATORY_TRACT | Status: AC
  Administered 2015-11-20: 2.5 mg via RESPIRATORY_TRACT

## 2015-11-20 MED ORDER — HEPARIN BOLUS VIA INFUSION
1400.0000 [IU] | Freq: Once | INTRAVENOUS | Status: AC
  Administered 2015-11-20: 1400 [IU] via INTRAVENOUS
  Filled 2015-11-20: qty 1400

## 2015-11-20 MED ORDER — SODIUM CHLORIDE 0.9 % WEIGHT BASED INFUSION: 1.0000 mL/kg/h | INTRAVENOUS | Status: AC

## 2015-11-20 MED ORDER — SODIUM CHLORIDE 0.9% FLUSH: 3.0000 mL | INTRAVENOUS | Status: DC | PRN

## 2015-11-20 MED ORDER — LABETALOL HCL 5 MG/ML IV SOLN
INTRAVENOUS | Status: DC | PRN
  Administered 2015-11-20: 20 mg via INTRAVENOUS

## 2015-11-20 MED ORDER — LABETALOL HCL 5 MG/ML IV SOLN
INTRAVENOUS | Status: AC
  Filled 2015-11-20: qty 4

## 2015-11-20 MED ORDER — MIDAZOLAM HCL 2 MG/2ML IJ SOLN
INTRAMUSCULAR | Status: AC
  Filled 2015-11-20: qty 2

## 2015-11-20 MED ORDER — MIDAZOLAM HCL 2 MG/2ML IJ SOLN
INTRAMUSCULAR | Status: DC | PRN
  Administered 2015-11-20: 1 mg via INTRAVENOUS

## 2015-11-20 MED ORDER — FENTANYL CITRATE (PF) 100 MCG/2ML IJ SOLN
INTRAMUSCULAR | Status: DC | PRN
  Administered 2015-11-20: 25 ug via INTRAVENOUS

## 2015-11-20 SURGICAL SUPPLY — 9 items
CATH INFINITI 5FR ANG PIGTAIL (CATHETERS) ×3 IMPLANT
CATH INFINITI 5FR JL4 (CATHETERS) ×3 IMPLANT
CATH INFINITI JR4 5F (CATHETERS) ×3 IMPLANT
DEVICE CLOSURE MYNXGRIP 5F (Vascular Products) ×3 IMPLANT
KIT MANI 3VAL PERCEP (MISCELLANEOUS) ×3 IMPLANT
NEEDLE PERC 18GX7CM (NEEDLE) ×3 IMPLANT
PACK CARDIAC CATH (CUSTOM PROCEDURE TRAY) ×3 IMPLANT
SHEATH PINNACLE 5F 10CM (SHEATH) ×3 IMPLANT
WIRE EMERALD 3MM-J .035X150CM (WIRE) ×3 IMPLANT

## 2015-11-20 NOTE — Progress Notes (Signed)
Post dialysis 

## 2015-11-20 NOTE — Progress Notes (Signed)
Patient has creatinine much higher today 3.7, thus will hold off cath and couldnot tolerate fluids as SOB. Held lisinopril and diuretics as CRI. Will get renal input prior to cath Cancel cath.

## 2015-11-20 NOTE — Progress Notes (Signed)
ANTICOAGULATION CONSULT NOTE - Initial Consult  Pharmacy Consult for heparin bolus and drip Indication: chest pain/ACS  Allergies  Allergen Reactions  . Cephalosporins Anaphylaxis  . Penicillins Anaphylaxis and Other (See Comments)    Has patient had a PCN reaction causing immediate rash, facial/tongue/throat swelling, SOB or lightheadedness with hypotension: Yes Has patient had a PCN reaction causing severe rash involving mucus membranes or skin necrosis: No Has patient had a PCN reaction that required hospitalization No Has patient had a PCN reaction occurring within the last 10 years: No If all of the above answers are "NO", then may proceed with Cephalosporin use.  . Lamictal [Lamotrigine] Other (See Comments)    Reaction:  Hallucinations  . Phenergan [Promethazine Hcl] Nausea And Vomiting  . Pravastatin Other (See Comments)    Reaction:  Muscle pain   . Sulfa Antibiotics Other (See Comments)    Reaction:  Unknown     Patient Measurements: Height: 5\' 9"  (175.3 cm) Weight: 254 lb 4.8 oz (115.3 kg) IBW/kg (Calculated) : 66.2 Heparin Dosing Weight: 92.2 kg  Vital Signs: Temp: 97.6 F (36.4 C) (08/21 0412) Temp Source: Oral (08/21 0412) BP: 146/84 (08/21 0412) Pulse Rate: 69 (08/21 0412)  Labs:  Recent Labs  11/18/15 2133 11/19/15 0617 11/19/15 0936 11/19/15 1421 11/19/15 1547 11/19/15 2042 11/20/15 0502  HGB 9.5*  --   --   --   --   --  7.6*  HCT 27.6*  --   --   --   --   --  22.5*  PLT 177  --   --   --   --   --  153  APTT  --   --   --  25  --   --   --   LABPROT  --   --   --  12.8  --   --   --   INR  --   --   --  0.96  --   --   --   HEPARINUNFRC  --   --   --   --   --  0.21* 0.27*  CREATININE 2.90*  --   --   --   --   --   --   TROPONINI 0.07* 0.11* 0.12*  --  0.13*  --   --     Estimated Creatinine Clearance: 30.8 mL/min (by C-G formula based on SCr of 2.9 mg/dL).   Medical History: Past Medical History:  Diagnosis Date  . Asthma   . CAD  (coronary artery disease)   . CHF (congestive heart failure) (HCC)   . Diabetes mellitus without complication (HCC)   . Endometriosis   . ESRD (end stage renal disease) (HCC)   . Gastroparesis   . GERD (gastroesophageal reflux disease)   . HLD (hyperlipidemia)   . Hypertension   . Renal disorder     Medications:  Scheduled:  . calcitRIOL  0.25 mcg Oral Daily  . calcium acetate  2,001 mg Oral TID WC  . calcium carbonate  1 tablet Oral TID WC  . Chlorhexidine Gluconate Cloth  6 each Topical Q0600  . clopidogrel  75 mg Oral Daily  . colesevelam  625 mg Oral BID WC  . divalproex  1,000 mg Oral TID  . docusate sodium  100 mg Oral QHS  . erythromycin  250 mg Oral QID  . heparin  1,400 Units Intravenous Once  . hydrALAZINE  50 mg Oral Q6H  . insulin aspart  0-24 Units Subcutaneous Q4H  . insulin glargine  8 Units Subcutaneous QHS  . isosorbide mononitrate  60 mg Oral Daily  . levETIRAcetam  500 mg Oral q morning - 10a  . metoCLOPramide  10 mg Oral QID  . metoprolol  50 mg Oral BID  . mupirocin ointment  1 application Nasal BID  . nicotine  14 mg Transdermal Daily  . nitroGLYCERIN  1 inch Topical Q6H  . nystatin  1 g Topical BID  . pantoprazole  80 mg Oral Daily  . polyethylene glycol  17 g Oral BID  . ranolazine  500 mg Oral BID  . risperiDONE  1 mg Oral TID  . sodium chloride flush  3 mL Intravenous Q12H   Infusions:  . sodium chloride 125 mL/hr at 11/20/15 0331  . sodium chloride 3 mL/kg/hr (11/20/15 0519)   Followed by  . sodium chloride    . heparin 1,400 Units/hr (11/20/15 0330)    Assessment: Pharmacy has been consulted to dose heparin bolus and drip in this 50 yoF for chest pain/ACS. Per med rec, patient was not taking anticoagulat PTA nor was administered anticoagulant during admission prior to heparin. Baseline labs were ordered. HL ordered for 6 hours after administration (08/20 @ 2100). CBC ordered with 08/21 AM labs.  Goal of Therapy:  Heparin level 0.3-0.7  units/ml Monitor platelets by anticoagulation protocol: Yes   Plan:  Give 4000 units bolus x 1 Start heparin infusion at 1200 units/hr Check anti-Xa level in 6 hours and daily while on heparin Continue to monitor H&H and platelets   8/20:  HL @ 20:42 = 0.21 Will order Heparin 1400 units IV X 1 and increase drip rate to 1400 units/hr. Will order HL 6 hrs after rate change.  8/21:  HL @ 0400 = 0.27 Will order Heparin 1400 units IV X 1 bolus and increase drip rate to 1600 units/hr. Will order HL 6 hrs after rate change.   Guhan Bruington D 11/20/2015 6:13 AM

## 2015-11-20 NOTE — Progress Notes (Signed)
Pt. Slept throughout the night. C/O of headache x2, medicated with effective results. No signs or c/o SOB or acute distress noted. Pre-cath orders complete (IV fluids, groin prep, consent signed, ASA 81). Will continue to monitor pt.

## 2015-11-20 NOTE — Progress Notes (Signed)
Dialysis complete

## 2015-11-20 NOTE — Progress Notes (Signed)
Pre Dailysis 

## 2015-11-20 NOTE — Progress Notes (Signed)
[facility]  ROUNDING NOTE   Subjective:   Admitted with chest pain. Plan on cardiac catheterization later today.  Patient with shortness of breath and difficulty breathing.   Last hemodialysis was Saturday.   Objective:  Vital signs in last 24 hours:  Temp:  [97.5 F (36.4 C)-97.9 F (36.6 C)] 97.5 F (36.4 C) (08/21 1113) Pulse Rate:  [66-88] 66 (08/21 1113) Resp:  [15-18] 16 (08/21 1113) BP: (113-186)/(57-107) 165/58 (08/21 1113) SpO2:  [94 %-99 %] 99 % (08/21 1113)  Weight change:  Filed Weights   11/18/15 2010 11/19/15 0131  Weight: 114.4 kg (252 lb 3.3 oz) 115.3 kg (254 lb 4.8 oz)    Intake/Output: I/O last 3 completed shifts: In: 2704.9 [P.O.:240; I.V.:2464.9] Out: 825 [Urine:825]   Intake/Output this shift:  Total I/O In: -  Out: 100 [Urine:100]  Physical Exam: General: NAD, laying in bed  Head: Normocephalic, atraumatic. Moist oral mucosal membranes  Eyes: Anicteric, PERRL  Neck: Supple, trachea midline  Lungs:  Bilateral crackles diffuse  Heart: Regular rate and rhythm  Abdomen:  Soft, nontender,   Extremities: trace peripheral edema, right BKA  Neurologic: Nonfocal, moving all four extremities  Skin: No lesions  Access: RIJ permcath    Basic Metabolic Panel:  Recent Labs Lab 11/18/15 2133 11/19/15 1547 11/20/15 0502  NA 139  --  134*  K 3.2*  --  3.3*  CL 102  --  100*  CO2 34*  --  32  GLUCOSE 148*  --  118*  BUN 15  --  23*  CREATININE 2.90*  --  3.77*  CALCIUM 9.1  --  8.8*  MG  --  1.9  --     Liver Function Tests: No results for input(s): AST, ALT, ALKPHOS, BILITOT, PROT, ALBUMIN in the last 168 hours. No results for input(s): LIPASE, AMYLASE in the last 168 hours. No results for input(s): AMMONIA in the last 168 hours.  CBC:  Recent Labs Lab 11/18/15 2133 11/20/15 0502  WBC 5.9 5.9  HGB 9.5* 7.6*  HCT 27.6* 22.5*  MCV 92.9 96.2  PLT 177 153    Cardiac Enzymes:  Recent Labs Lab 11/18/15 2133  11/19/15 0617 11/19/15 0936 11/19/15 1547  TROPONINI 0.07* 0.11* 0.12* 0.13*    BNP: Invalid input(s): POCBNP  CBG:  Recent Labs Lab 11/19/15 1646 11/19/15 2145 11/20/15 0012 11/20/15 0407 11/20/15 0743  GLUCAP 161* 147* 123* 122* 105*    Microbiology: Results for orders placed or performed during the hospital encounter of 11/18/15  MRSA PCR Screening     Status: Abnormal   Collection Time: 11/19/15  2:01 AM  Result Value Ref Range Status   MRSA by PCR POSITIVE (A) NEGATIVE Final    Comment:        The GeneXpert MRSA Assay (FDA approved for NASAL specimens only), is one component of a comprehensive MRSA colonization surveillance program. It is not intended to diagnose MRSA infection nor to guide or monitor treatment for MRSA infections. RESULT CALLED TO, READ BACK BY AND VERIFIED WITH:  Kimberly GlasgowJANCY Montgomery AT 1519 11/19/15 SDR     Coagulation Studies:  Recent Labs  11/19/15 1421  LABPROT 12.8  INR 0.96    Urinalysis: No results for input(s): COLORURINE, LABSPEC, PHURINE, GLUCOSEU, HGBUR, BILIRUBINUR, KETONESUR, PROTEINUR, UROBILINOGEN, NITRITE, LEUKOCYTESUR in the last 72 hours.  Invalid input(s): APPERANCEUR    Imaging: Dg Chest 2 View  Result Date: 11/20/2015 CLINICAL DATA:  Left side chest pain, shortness of breath. EXAM: CHEST  2 VIEW COMPARISON:  11/18/2015 FINDINGS: Right dialysis catheter remains in place, unchanged. Mild cardiomegaly. No confluent opacities. Small left pleural effusion. No edema. No acute bony abnormality. IMPRESSION: Mild cardiomegaly. Small left effusion. Electronically Signed   By: Charlett Nose M.D.   On: 11/20/2015 09:26   Dg Chest 2 View  Result Date: 11/18/2015 CLINICAL DATA:  Acute onset of left-sided chest pain and shortness of breath. Nausea. Initial encounter. EXAM: CHEST  2 VIEW COMPARISON:  Chest radiograph from 10/05/2015 FINDINGS: The lungs are well-aerated. Vascular congestion is noted. There is no evidence of focal  opacification, pleural effusion or pneumothorax. The heart is borderline normal in size. No acute osseous abnormalities are seen. A right-sided dual-lumen catheter is noted ending about the mid to distal SVC. IMPRESSION: Vascular congestion noted.  Lungs remain grossly clear. Electronically Signed   By: Roanna Raider M.D.   On: 11/18/2015 21:02     Medications:     . calcitRIOL  0.25 mcg Oral Daily  . calcium acetate  2,001 mg Oral TID WC  . calcium carbonate  1 tablet Oral TID WC  . Chlorhexidine Gluconate Cloth  6 each Topical Q0600  . clopidogrel  75 mg Oral Daily  . colesevelam  625 mg Oral BID WC  . divalproex  1,000 mg Oral TID  . docusate sodium  100 mg Oral QHS  . erythromycin  250 mg Oral QID  . hydrALAZINE  50 mg Oral Q6H  . insulin aspart  0-24 Units Subcutaneous Q4H  . insulin glargine  8 Units Subcutaneous QHS  . isosorbide mononitrate  60 mg Oral Daily  . levETIRAcetam  500 mg Oral q morning - 10a  . metoCLOPramide  10 mg Oral QID  . metoprolol  50 mg Oral BID  . mupirocin ointment  1 application Nasal BID  . nicotine  14 mg Transdermal Daily  . nitroGLYCERIN  1 inch Topical Q6H  . nystatin  1 g Topical BID  . pantoprazole  80 mg Oral Daily  . polyethylene glycol  17 g Oral BID  . ranolazine  500 mg Oral BID  . risperiDONE  1 mg Oral TID   acetaminophen, bisacodyl, hydrALAZINE, lactulose, nitroGLYCERIN, ondansetron, oxyCODONE, senna  Assessment/ Plan:  [name] is a 50 y.o. white female with end-stage renal disease on hemodialysis, asthma, gastroparesis, diabetes mellitus type II, congestive heart failure, hypertension, endometriosis, coronary atherosclerosis  [facility]/ [facility] Kimberly Montgomery/ TTS RIJ permcath  1. End-stage renal disease on hemodialysis dialysis, TTHS: with respiratory distress. CXR with effusions.  - Plan on dialysis for today and then again tomorrow. Orders prepared.   2. Anemia of CKD:Low threshold for transfusion.  - Hold  epo today due to concerns of ischemia.   3. Hypertension: seems to be volume driven along with chest pain/SOB - hydralazine, isosorbide mononitrate, metoprolol  4. Secondary Hyperparathyroidism: PTH 98 on 10/06/15 - Discontinue calcitriol - TUMS and calcium acetate.   LOS: 0 Kimberly Montgomery 8/21/201711:19 AM

## 2015-11-20 NOTE — Progress Notes (Signed)
Patient called nurse into room for c/o sob.  Wheezing noted in all lung bases.  Dr. Winona Legato paged and was given orders to d/c NS, d/c heparin, order stat xray of chest and give breathing treatment.   Continue to monitor.

## 2015-11-20 NOTE — Progress Notes (Signed)
Dr. Welton Flakes verbal order for lasix 40mg  IV stat.  Spoke to him about creatinine and he still wants her to have it so that she is "able to lay flat for the cath".

## 2015-11-20 NOTE — Progress Notes (Signed)
Pre Dialysis 

## 2015-11-20 NOTE — Progress Notes (Signed)
Pt returned from dialysis.  A&O x3.  No distress on 2LO2 per [place].  Dressing to rt groin dry and intact.  Foley patent with yellow urine. VSS.  Denies need at this time. CB in reach, SR up x2, bed alarm on.

## 2015-11-20 NOTE — Progress Notes (Signed)
SUBJECTIVE: Patient had comfortable night last night with no chest pain or shortness of breath   Vitals:   11/19/15 1139 11/19/15 1405 11/19/15 2144 11/20/15 0412  BP: (!) 159/107 (!) 113/57 (!) 158/62 (!) 146/84  Pulse:   72 69  Resp:   15 18  Temp:   97.9 F (36.6 C) 97.6 F (36.4 C)  TempSrc:   Oral Oral  SpO2:   97% 95%  Weight:      Height:        Intake/Output Summary (Last 24 hours) at 11/20/15 0756 Last data filed at 11/20/15 0753  Gross per 24 hour  Intake          2704.94 ml  Output              825 ml  Net          1879.94 ml    LABS: Basic Metabolic Panel:  Recent Labs  01/74/94 2133 11/19/15 1547  NA 139  --   K 3.2*  --   CL 102  --   CO2 34*  --   GLUCOSE 148*  --   BUN 15  --   CREATININE 2.90*  --   CALCIUM 9.1  --   MG  --  1.9   Liver Function Tests: No results for input(s): AST, ALT, ALKPHOS, BILITOT, PROT, ALBUMIN in the last 72 hours. No results for input(s): LIPASE, AMYLASE in the last 72 hours. CBC:  Recent Labs  11/18/15 2133 11/20/15 0502  WBC 5.9 5.9  HGB 9.5* 7.6*  HCT 27.6* 22.5*  MCV 92.9 96.2  PLT 177 153   Cardiac Enzymes:  Recent Labs  11/19/15 0617 11/19/15 0936 11/19/15 1547  TROPONINI 0.11* 0.12* 0.13*   BNP: Invalid input(s): POCBNP D-Dimer: No results for input(s): DDIMER in the last 72 hours. Hemoglobin A1C: No results for input(s): HGBA1C in the last 72 hours. Fasting Lipid Panel:  Recent Labs  11/19/15 0617  CHOL 215*  HDL 63  LDLCALC 115*  TRIG 186*  CHOLHDL 3.4   Thyroid Function Tests: No results for input(s): TSH, T4TOTAL, T3FREE, THYROIDAB in the last 72 hours.  Invalid input(s): FREET3 Anemia Panel: No results for input(s): VITAMINB12, FOLATE, FERRITIN, TIBC, IRON, RETICCTPCT in the last 72 hours.   PHYSICAL EXAM General: Well developed, well nourished, in no acute distress HEENT:  Normocephalic and atramatic Neck:  No JVD.  Lungs: Clear bilaterally to auscultation and  percussion. Heart: HRRR . Normal S1 and S2 without gallops or murmurs.  Abdomen: Bowel sounds are positive, abdomen soft and non-tender  Msk:  Back normal, normal gait. Normal strength and tone for age. Extremities: No clubbing, cyanosis or edema.   Neuro: Alert and oriented X 3. Psych:  Good affect, responds appropriately  TELEMETRY:Sinus rhythm  ASSESSMENT AND PLAN: Non-STEMI with renal insufficiency scheduled for cardiac catheterization today. We gave 125 cc of half-normal saline since yesterday and 9 AM. And will check BUN/creatinine this morning. Will give limited amount of dye to check the stent in the mid LAD.  Active Problems:   Chest pain, rule out acute myocardial infarction    Kayvon Mo A, MD, [facility] 11/20/2015 7:56 AM

## 2015-11-20 NOTE — Progress Notes (Signed)
Dialysis started 

## 2015-11-20 NOTE — Progress Notes (Signed)
[facility] [place] at [facility]   PATIENT NAME: Kimberly Montgomery    MR#:  [id number]  DATE OF BIRTH:  1965-12-06  SUBJECTIVE:  CHIEF COMPLAINT:   Chief Complaint  Patient presents with  . Chest Pain   Patient is 50 year old Caucasian female with past medical history significant for history of coronary artery disease, status post cardiac catheterization in June 2017 with stent placement in mid LAD, end-stage renal disease, diabetes mellitus, hypertension, hyperlipidemia, gastric esophageal reflux disease, gastroparesis, who presents back to the hospital with complaints of midsternal chest pains, associated with shortness of breath and diaphoresis. Patient's troponin was mildly elevated, she still complains of chest pains. Troponin is mildly elevated at 0.27 at max. Patient received IV fluids for cardiac catheterization preparation last night, and developed acute respiratory distress, CHF on chest x-ray. She admits of some chest pains, sob  Review of Systems  Constitutional: Negative for chills, fever and weight loss.  HENT: Negative for congestion.   Eyes: Negative for blurred vision and double vision.  Respiratory: Positive for shortness of breath. Negative for cough, sputum production and wheezing.   Cardiovascular: Positive for chest pain. Negative for palpitations, orthopnea, leg swelling and PND.  Gastrointestinal: Negative for abdominal pain, blood in stool, constipation, diarrhea, nausea and vomiting.  Genitourinary: Negative for dysuria, frequency, hematuria and urgency.  Musculoskeletal: Negative for falls.  Neurological: Negative for dizziness, tremors, focal weakness and headaches.  Endo/Heme/Allergies: Does not bruise/bleed easily.  Psychiatric/Behavioral: Negative for depression. The patient does not have insomnia.     VITAL SIGNS: Blood pressure (!) 169/67, pulse 68, temperature 97.5 F (36.4 C), temperature source Oral, resp. rate 13, height 5\' 9"   (1.753 m), weight 115.3 kg (254 lb 4.8 oz), last menstrual period 03/16/2015, SpO2 100 %.  PHYSICAL EXAMINATION:   GENERAL:  50 y.o.-year-old patient lying in the bed  in moderate respiratory distress, pale, uncomfortable laying in the bed. Right upper chest permanent catheter  EYES: Pupils equal, round, reactive to light and accommodation. No scleral icterus. Extraocular muscles intact.  HEENT: Head atraumatic, normocephalic. Oropharynx and nasopharynx clear.  NECK:  Supple, no jugular venous distention. No thyroid enlargement, no tenderness.  LUNGS: . Diminished breath sounds bilaterally, no wheezing, rales,rhonchi ,  with some crepitations anteriorly.Using accessory muscles of respiration at rest, tachypneic, uncomfortable .  CARDIOVASCULAR: S1, S2 normal. No murmurs, rubs, or gallops.  ABDOMEN: Soft, nontender, nondistended. Bowel sounds present. No organomegaly or mass.  EXTREMITIES: Left lower extremity and pedal edema, no cyanosis, or clubbing. Right AKA NEUROLOGIC: Cranial nerves II through XII are intact. Muscle strength 5/5 in all extremities. Sensation intact. Gait not checked.  PSYCHIATRIC: The patient is alert and oriented x 3.  SKIN: No obvious rash, lesion, or ulcer.   ORDERS/RESULTS REVIEWED:   CBC  Recent Labs Lab 11/18/15 2133 11/20/15 0502  WBC 5.9 5.9  HGB 9.5* 7.6*  HCT 27.6* 22.5*  PLT 177 153  MCV 92.9 96.2  MCH 32.0 32.4  MCHC 34.5 33.6  RDW 17.2* 17.1*   ------------------------------------------------------------------------------------------------------------------  Chemistries   Recent Labs Lab 11/18/15 2133 11/19/15 1547 11/20/15 0502  NA 139  --  134*  K 3.2*  --  3.3*  CL 102  --  100*  CO2 34*  --  32  GLUCOSE 148*  --  118*  BUN 15  --  23*  CREATININE 2.90*  --  3.77*  CALCIUM 9.1  --  8.8*  MG  --  1.9  --     ------------------------------------------------------------------------------------------------------------------  estimated creatinine clearance is 23.7 mL/min (by C-G formula based on SCr of 3.77 mg/dL). ------------------------------------------------------------------------------------------------------------------  Recent Labs  11/19/15 0617  TSH 8.570*  T4TOTAL 4.8    Cardiac Enzymes  Recent Labs Lab 11/19/15 0617 11/19/15 0936 11/19/15 1547  TROPONINI 0.11* 0.12* 0.13*   ------------------------------------------------------------------------------------------------------------------ Invalid input(s): POCBNP ---------------------------------------------------------------------------------------------------------------  RADIOLOGY: Dg Chest 2 View  Result Date: 11/20/2015 CLINICAL DATA:  Left side chest pain, shortness of breath. EXAM: CHEST  2 VIEW COMPARISON:  11/18/2015 FINDINGS: Right dialysis catheter remains in place, unchanged. Mild cardiomegaly. No confluent opacities. Small left pleural effusion. No edema. No acute bony abnormality. IMPRESSION: Mild cardiomegaly. Small left effusion. Electronically Signed   By: Charlett Nose M.D.   On: 11/20/2015 09:26   Dg Chest 2 View  Result Date: 11/18/2015 CLINICAL DATA:  Acute onset of left-sided chest pain and shortness of breath. Nausea. Initial encounter. EXAM: CHEST  2 VIEW COMPARISON:  Chest radiograph from 10/05/2015 FINDINGS: The lungs are well-aerated. Vascular congestion is noted. There is no evidence of focal opacification, pleural effusion or pneumothorax. The heart is borderline normal in size. No acute osseous abnormalities are seen. A right-sided dual-lumen catheter is noted ending about the mid to distal SVC. IMPRESSION: Vascular congestion noted.  Lungs remain grossly clear. Electronically Signed   By: Roanna Raider M.D.   On: 11/18/2015 21:02    EKG:  Orders placed or performed during the hospital encounter  of 11/18/15  . ED EKG within 10 minutes  . ED EKG within 10 minutes  . EKG 12-Lead (at 6am)  . EKG 12-Lead (Repeat cardiac markers, recurrent chest pain)  . EKG 12-Lead (at 6am)  . EKG 12-Lead (Repeat cardiac markers, recurrent chest pain)  . EKG 12-Lead  . EKG 12-Lead    ASSESSMENT AND PLAN:  Active Problems:   Chest pain, rule out acute myocardial infarction   NSTEMI (non-ST elevated myocardial infarction) (HCC)  #1. Non-Q-wave MI with minimal elevation of troponin to 0.27 at max, status post recent cardiac catheterization with mid LAD stent placement in June 2017 by Dr. Welton Flakes, the patient will undergo cardiac catheterization today , appreciate cardiologist input, continue Plavix, aspirin, metoprolol, the patient is apparently allergic to statin. Patient's LDL was found to be elevated at 115, triglycerides at 186, initiated WelChol #2. Essential hypertension, continue metoprolol, lisinopril, nitroglycerin topically, blood pressure readings  high, patient will undergo dialysis today, follow after dialysis #3. Hyperlipidemia, initiated WelChol, patient is allergic to statin #4 anemia, Hemoccult is ordered, pending, hemoglobin level dropped down significantly, possibly with rehydration/fluid overload, follow after dialysis, no bleeding was noted  #5. End-stage renal disease, nephrologist recommends dialysis today  #6. Acute respiratory distress with hypoxia due to acute diastolic CHF, chest x-ray revealed mild cardiomegaly, small left effusion, continue oxygen therapy, dialysis today, discussed with Dr. Wynelle Link  #7acute diastolic CHF due to fluid overload as preparation for cardiac catheterization, dialysis today is being planned by nephrologist, appreciate input    Management plans discussed with the patient, family and they are in agreement.   DRUG ALLERGIES:  Allergies  Allergen Reactions  . Cephalosporins Anaphylaxis  . Penicillins Anaphylaxis and Other (See Comments)    Has  patient had a PCN reaction causing immediate rash, facial/tongue/throat swelling, SOB or lightheadedness with hypotension: Yes Has patient had a PCN reaction causing severe rash involving mucus membranes or skin necrosis: No Has patient had a PCN reaction that required hospitalization No Has patient had a PCN reaction occurring within the last 10 years:  No If all of the above answers are "NO", then may proceed with Cephalosporin use.  . Lamictal [Lamotrigine] Other (See Comments)    Reaction:  Hallucinations  . Phenergan [Promethazine Hcl] Nausea And Vomiting  . Pravastatin Other (See Comments)    Reaction:  Muscle pain   . Sulfa Antibiotics Other (See Comments)    Reaction:  Unknown     CODE STATUS:     Code Status Orders        Start     Ordered   11/19/15 0118  Full code  Continuous     11/19/15 0117    Code Status History    Date Active Date Inactive Code Status Order ID Comments User Context   10/06/2015  2:08 AM 10/06/2015  6:32 PM Full Code [id number]  Ihor AustinPavan Pyreddy, MD Inpatient   09/13/2015  2:14 AM 09/15/2015  5:28 PM Full Code [id number]  Arnaldo NatalMichael S Diamond, MD Inpatient   08/24/2015  3:50 AM 08/31/2015  6:47 PM Full Code [id number]  Ihor AustinPavan Pyreddy, MD Inpatient   07/16/2015  6:26 AM 08/02/2015  6:48 PM Full Code [id number]  Ihor AustinPavan Pyreddy, MD ED   07/10/2015  1:49 AM 07/12/2015  1:01 PM Full Code [id number]  Oralia Manisavid Willis, MD Inpatient   05/12/2015  3:46 PM 05/16/2015  5:35 PM Full Code [id number]  Auburn BilberryShreyang Patel, MD ED      TOTAL Critical careTIME TAKING CARE OF THIS PATIENT: 40  minutes.   Discussed with nephrologist  Kenasia Scheller M.D on 11/20/2015 at 1:41 PM  Between 7am to 6pm - Pager - [phone]  After 6pm go to [email or web address] - password [email or web address]  [facility]  Office  [phone]  CC: Primary care physician; Jarome MatinALE, PATRICK D, MD

## 2015-11-20 NOTE — Progress Notes (Signed)
Patient has no obstructive disease. Stent in the mid LAD having no significant restenosis and moderate disease in the distal LAD as well as OM1. Stent in mid RCA also has noted recent significant restenosis and LV gram was not done due to renal insufficiency. Prior echocardiogram revealed normal ejection fraction. Patient is to have dialysis after cardiac. However only 40 cc of dye was given.

## 2015-11-20 NOTE — Care Management (Signed)
Presents from [employer]. CSW aware. For cath today then dialysis.

## 2015-11-21 DIAGNOSIS — I5031 Acute diastolic (congestive) heart failure: Secondary | ICD-10-CM

## 2015-11-21 DIAGNOSIS — J9601 Acute respiratory failure with hypoxia: Secondary | ICD-10-CM

## 2015-11-21 DIAGNOSIS — N186 End stage renal disease: Secondary | ICD-10-CM

## 2015-11-21 DIAGNOSIS — Z992 Dependence on renal dialysis: Secondary | ICD-10-CM

## 2015-11-21 DIAGNOSIS — Z22322 Carrier or suspected carrier of Methicillin resistant Staphylococcus aureus: Secondary | ICD-10-CM

## 2015-11-21 DIAGNOSIS — D638 Anemia in other chronic diseases classified elsewhere: Secondary | ICD-10-CM

## 2015-11-21 DIAGNOSIS — I10 Essential (primary) hypertension: Secondary | ICD-10-CM

## 2015-11-21 LAB — CBC
HCT: 21 % — ABNORMAL LOW (ref 35.0–47.0)
HEMOGLOBIN: 7.3 g/dL — AB (ref 12.0–16.0)
MCH: 33.2 pg (ref 26.0–34.0)
MCHC: 34.7 g/dL (ref 32.0–36.0)
MCV: 95.7 fL (ref 80.0–100.0)
PLATELETS: 132 10*3/uL — AB (ref 150–440)
RBC: 2.19 MIL/uL — ABNORMAL LOW (ref 3.80–5.20)
RDW: 17.1 % — AB (ref 11.5–14.5)
WBC: 4.2 10*3/uL (ref 3.6–11.0)

## 2015-11-21 LAB — GLUCOSE, CAPILLARY
GLUCOSE-CAPILLARY: 125 mg/dL — AB (ref 65–99)
GLUCOSE-CAPILLARY: 83 mg/dL (ref 65–99)
GLUCOSE-CAPILLARY: 117 mg/dL — AB (ref 65–99)

## 2015-11-21 LAB — RENAL FUNCTION PANEL
ALBUMIN: 1.8 g/dL — AB (ref 3.5–5.0)
Anion gap: 2 — ABNORMAL LOW (ref 5–15)
BUN: 12 mg/dL (ref 6–20)
CALCIUM: 8.1 mg/dL — AB (ref 8.9–10.3)
CO2: 34 mmol/L — AB (ref 22–32)
CREATININE: 2.41 mg/dL — AB (ref 0.44–1.00)
Chloride: 100 mmol/L — ABNORMAL LOW (ref 101–111)
GFR calc Af Amer: 25 mL/min — ABNORMAL LOW (ref >60)
GFR calc non Af Amer: 22 mL/min — ABNORMAL LOW (ref >60)
GLUCOSE: 143 mg/dL — AB (ref 65–99)
PHOSPHORUS: 1.7 mg/dL — AB (ref 2.5–4.6)
Potassium: 3.7 mmol/L (ref 3.5–5.1)
SODIUM: 136 mmol/L (ref 135–145)

## 2015-11-21 LAB — HEMOGLOBIN: HEMOGLOBIN: 7.4 g/dL — AB (ref 12.0–16.0)

## 2015-11-21 MED ORDER — COLESEVELAM HCL 625 MG PO TABS: 625.0000 mg | ORAL_TABLET | Freq: Two times a day (BID) | ORAL | 6 refills | Status: DC

## 2015-11-21 MED ORDER — RANOLAZINE ER 500 MG PO TB12
1000.0000 mg | ORAL_TABLET | Freq: Two times a day (BID) | ORAL | Status: DC
  Administered 2015-11-21: 1000 mg via ORAL
  Filled 2015-11-21: qty 2

## 2015-11-21 MED ORDER — HYDRALAZINE HCL 50 MG PO TABS
100.0000 mg | ORAL_TABLET | Freq: Three times a day (TID) | ORAL | Status: DC
  Administered 2015-11-21: 100 mg via ORAL
  Filled 2015-11-21: qty 2

## 2015-11-21 MED ORDER — EPOETIN ALFA 10000 UNIT/ML IJ SOLN
10000.0000 [IU] | INTRAMUSCULAR | Status: DC
  Administered 2015-11-21: 10000 [IU] via INTRAVENOUS

## 2015-11-21 MED ORDER — ISOSORBIDE MONONITRATE ER 60 MG PO TB24
60.0000 mg | ORAL_TABLET | Freq: Every day | ORAL | Status: DC
  Administered 2015-11-21: 60 mg via ORAL
  Filled 2015-11-21: qty 1

## 2015-11-21 MED ORDER — MUPIROCIN 2 % EX OINT: 1.0000 "application " | TOPICAL_OINTMENT | Freq: Two times a day (BID) | CUTANEOUS | 0 refills | Status: DC

## 2015-11-21 NOTE — Progress Notes (Signed)
Dialysis started 

## 2015-11-21 NOTE — Progress Notes (Signed)
Pre Dailysis

## 2015-11-21 NOTE — Care Management (Cosign Needed)
For discharge back to [facility] today.  Patient in agreement.

## 2015-11-21 NOTE — Discharge Summary (Addendum)
[facility] [place] at [facility]   PATIENT NAME: Kimberly Montgomery    MR#:  [id number]  DATE OF BIRTH:  Apr 14, 1965  DATE OF ADMISSION:  11/18/2015 ADMITTING PHYSICIAN: Jon Gills Hugelmeyer, DO  DATE OF DISCHARGE: No discharge date for patient encounter.  PRIMARY CARE PHYSICIAN: DALE, PATRICK D, MD     ADMISSION DIAGNOSIS:  Elevated troponin [R79.89] Chest pain, unspecified chest pain type [R07.9]  DISCHARGE DIAGNOSIS:  Principal Problem:   NSTEMI (non-ST elevated myocardial infarction) (HCC) Active Problems:   Chest pain, rule out acute myocardial infarction   Hyperlipidemia   Acute respiratory failure with hypoxia (HCC)   Acute diastolic CHF (congestive heart failure) (HCC)   Essential hypertension   Anemia   ESRD on dialysis (HCC)   MRSA carrier   SECONDARY DIAGNOSIS:   Past Medical History:  Diagnosis Date  . Asthma   . CAD (coronary artery disease)   . CHF (congestive heart failure) (HCC)   . Diabetes mellitus without complication (HCC)   . Endometriosis   . ESRD (end stage renal disease) (HCC)   . Gastroparesis   . GERD (gastroesophageal reflux disease)   . HLD (hyperlipidemia)   . Hypertension   . Renal disorder     .pro HOSPITAL COURSE:   Patient is 50 year old Caucasian female with past medical history significant for history of coronary artery disease, status post cardiac catheterization in June 2017 with stent placement in mid LAD, end-stage renal disease, diabetes mellitus, hypertension, hyperlipidemia, gastric esophageal reflux disease, gastroparesis, who presents back to the hospital with complaints of midsternal chest pains, associated with shortness of breath and diaphoresis. Patient's troponin was mildly elevated, she Continued to complain of chest pains. Troponin was elevated at 0.27 at max. The patient was seen by cardiologist and recommended cardiac catheterization, she received IV fluids for cardiac catheterization  preparation requiring oxygen therapy, and developed acute respiratory distress, CHF on chest x-ray was noted. The patient underwent cardiac catheterization on 21st of August 2017 by Dr. Welton Flakes, revealing no obstructive disease, stented mid LAD showed no significant stenosis and moderate disease in distal LAD as well as OM1. Stented mid RCA was noted to have 35% stenosis. Patient was recommended to continue medical therapy. Since she was allergic to statins, WelChol was initiated. Lipid panel was performed, revealing total cholesterol of 215, triglycerides of 186, LDL 115. Patient had to go to urgent dialysis session post cardiac catheterization due to fluid overload, she also underwent hemodialysis on the 11/01/2015, her oxygen is being weaned down, and if her O2 sats remained stable, she will likely be discharged home today. Discussion by problem: #1. Non-Q-wave MI with minimal elevation of troponin to 0.27 at max, status post recent cardiac catheterization with mid LAD stent placement in June 2017 by Dr. Welton Flakes, status post cardiac catheterization 11/20/2015, Stent in the mid LAD is patent with moderate disease in the distal LAD as well as the first OM, advised medical therapy and f/u Monday 2 pm in cardiologist's office, appreciate cardiologist input, continue Plavix, aspirin, metoprolol, the patient is apparently allergic to statin. Patient's LDL was found to be elevated at 115, triglycerides at 186, initiated  WelChol. Follow patient's hemoglobin level very closely on current medications, as I suspect the patient's elevation of troponin could be related to severe anemia. #2. Essential hypertension, continue metoprolol, lisinopril, blood pressure has improved after dialysis #3. Hyperlipidemia, initiated WelChol, patient is allergic to statin #4 anemia, Hemoccult is ordered, not obtained during this hospitalization, hemoglobin level dropped  down to 7.4, with rehydration/fluid overload, recheck after dialysis, no  bleeding was noted  #5. End-stage renal disease, continue dialysis per schedule, which is Tuesdays, Thursdays, Saturdays.  #6. Acute respiratory failure with hypoxia due to acute diastolic CHF, chest x-ray revealed mild cardiomegaly, small left effusion, wean off oxygen therapy after dialysis 2 days and the row, possible discharge home if comfortable on room air, discussed with Dr. Wynelle LinkKolluru today  #7acute diastolic CHF due to fluid overload as preparation for cardiac catheterization, dialysis was performed twice 2 days in a row by nephrologist, improved, appreciate input , continue Zaroxolyn, torsemide #8. MRSA carrier, continue mupirocin for week D ISCHARGE CONDITIONS:    Stable  CONSULTS OBTAINED:  Treatment Team:  Laurier NancyShaukat A Khan, MD Mady HaagensenMunsoor Lateef, MD  DRUG ALLERGIES:   Allergies  Allergen Reactions  . Cephalosporins Anaphylaxis  . Penicillins Anaphylaxis and Other (See Comments)    Has patient had a PCN reaction causing immediate rash, facial/tongue/throat swelling, SOB or lightheadedness with hypotension: Yes Has patient had a PCN reaction causing severe rash involving mucus membranes or skin necrosis: No Has patient had a PCN reaction that required hospitalization No Has patient had a PCN reaction occurring within the last 10 years: No If all of the above answers are "NO", then may proceed with Cephalosporin use.  . Lamictal [Lamotrigine] Other (See Comments)    Reaction:  Hallucinations  . Phenergan [Promethazine Hcl] Nausea And Vomiting  . Pravastatin Other (See Comments)    Reaction:  Muscle pain   . Sulfa Antibiotics Other (See Comments)    Reaction:  Unknown     DISCHARGE MEDICATIONS:   Current Discharge Medication List    START taking these medications   Details  colesevelam (WELCHOL) 625 MG tablet Take 1 tablet (625 mg total) by mouth 2 (two) times daily with a meal. Qty: 60 tablet, Refills: 6    mupirocin ointment (BACTROBAN) 2 % Place 1 application into the  nose 2 (two) times daily. Qty: 22 g, Refills: 0      CONTINUE these medications which have NOT CHANGED   Details  bisacodyl (DULCOLAX) 10 MG suppository Place 10 mg rectally daily as needed for moderate constipation. Use if no relief from Milk of Magnesia    Bismuth Tribromoph-Petrolatum (XEROFORM PETROLATUM DRESSING) PADS Apply 1 each topically every 3 (three) days.    calcitRIOL (ROCALTROL) 0.25 MCG capsule Take 0.25 mcg by mouth daily.    calcium acetate (PHOSLO) 667 MG capsule Take 3 capsules (2,001 mg total) by mouth 3 (three) times daily with meals. Qty: 90 capsule, Refills: 0    calcium carbonate (TUMS - DOSED IN MG ELEMENTAL CALCIUM) 500 MG chewable tablet Chew 1 tablet (200 mg of elemental calcium total) by mouth 3 (three) times daily with meals. Qty: 120 tablet, Refills: 0    clopidogrel (PLAVIX) 75 MG tablet Take 75 mg by mouth daily.    divalproex (DEPAKOTE) 500 MG DR tablet Take 1,000 mg by mouth 3 (three) times daily.    docusate sodium (COLACE) 100 MG capsule Take 100 mg by mouth at bedtime.     erythromycin (E-MYCIN) 250 MG tablet Take 250 mg by mouth 4 (four) times daily.    hydrALAZINE (APRESOLINE) 25 MG tablet Take 25 mg by mouth every evening.    insulin aspart (NOVOLOG) 100 UNIT/ML injection Inject 0-9 Units into the skin 3 (three) times daily with meals. 15 units three times a day with meals, 5 units with snacks    insulin glargine (  LANTUS) 100 UNIT/ML injection Inject 0.08 mLs (8 Units total) into the skin at bedtime. Qty: 10 mL, Refills: 11    isosorbide mononitrate (IMDUR) 60 MG 24 hr tablet Take 1 tablet (60 mg total) by mouth daily. Qty: 30 tablet, Refills: 00    lactulose (CHRONULAC) 10 GM/15ML solution Take 10 g by mouth daily as needed for mild constipation.    levETIRAcetam (KEPPRA) 500 MG tablet Take 1 tablet (500 mg total) by mouth every morning. Qty: 30 tablet, Refills: 0    lisinopril (PRINIVIL,ZESTRIL) 40 MG tablet Take 40 mg by mouth  daily. Refills: 3    metoCLOPramide (REGLAN) 10 MG tablet Take 1 tablet (10 mg total) by mouth 4 (four) times daily. Qty: 90 tablet, Refills: 0    metolazone (ZAROXOLYN) 5 MG tablet Take 5 mg by mouth daily as needed (fluid).    metoprolol (LOPRESSOR) 50 MG tablet Take 1 tablet (50 mg total) by mouth 2 (two) times daily.    nicotine (NICODERM CQ - DOSED IN MG/24 HOURS) 14 mg/24hr patch Place 14 mg onto the skin daily.    nitroGLYCERIN (NITROSTAT) 0.4 MG SL tablet Place 0.4 mg under the tongue every 5 (five) minutes as needed for chest pain. Reported on 07/10/2015    nystatin (MYCOSTATIN) powder Apply 1 g topically 2 (two) times daily.    omeprazole (PRILOSEC) 20 MG capsule Take 40 mg by mouth 2 (two) times daily before a meal.    ondansetron (ZOFRAN ODT) 4 MG disintegrating tablet Take 1 tablet (4 mg total) by mouth every 6 (six) hours as needed for nausea or vomiting. Qty: 20 tablet, Refills: 0    oxyCODONE (OXY IR/ROXICODONE) 5 MG immediate release tablet Take 1 tablet (5 mg total) by mouth every 6 (six) hours as needed for severe pain. Qty: 20 tablet, Refills: 0    polyethylene glycol (MIRALAX / GLYCOLAX) packet Take 17 g by mouth daily. Qty: 14 each, Refills: 0    ranolazine (RANEXA) 500 MG 12 hr tablet Take 1 tablet (500 mg total) by mouth 2 (two) times daily. Qty: 60 tablet, Refills: 0    risperiDONE (RISPERDAL) 1 MG tablet Take 1 mg by mouth 3 (three) times daily.     senna (SENOKOT) 8.6 MG TABS tablet Take 2 tablets by mouth at bedtime as needed for mild constipation.    torsemide (DEMADEX) 100 MG tablet Take 100 mg by mouth daily.         DISCHARGE INSTRUCTIONS:    The patient is to follow-up with primary care physician, cardiologist as outpatient, she is to continue hemodialysis per schedule  If you experience worsening of your admission symptoms, develop shortness of breath, life threatening emergency, suicidal or homicidal thoughts you must seek medical  attention immediately by calling 911 or calling your MD immediately  if symptoms less severe.  You Must read complete instructions/literature along with all the possible adverse reactions/side effects for all the Medicines you take and that have been prescribed to you. Take any new Medicines after you have completely understood and accept all the possible adverse reactions/side effects.   Please note  You were cared for by a hospitalist during your hospital stay. If you have any questions about your discharge medications or the care you received while you were in the hospital after you are discharged, you can call the unit and asked to speak with the hospitalist on call if the hospitalist that took care of you is not available. Once you are discharged, your  primary care physician will handle any further medical issues. Please note that NO REFILLS for any discharge medications will be authorized once you are discharged, as it is imperative that you return to your primary care physician (or establish a relationship with a primary care physician if you do not have one) for your aftercare needs so that they can reassess your need for medications and monitor your lab values.    Today   CHIEF COMPLAINT:   Chief Complaint  Patient presents with  . Chest Pain    HISTORY OF PRESENT ILLNESS:  Kimberly Montgomery  is a 50 y.o. female with a known history of coronary artery disease, status post cardiac catheterization in June 2017 with stent placement in mid LAD, end-stage renal disease, diabetes mellitus, hypertension, hyperlipidemia, gastric esophageal reflux disease, gastroparesis, who presents back to the hospital with complaints of midsternal chest pains, associated with shortness of breath and diaphoresis. Patient's troponin was mildly elevated, she Continued to complain of chest pains. Troponin was elevated at 0.27 at max. The patient was seen by cardiologist and recommended cardiac catheterization, she  received IV fluids for cardiac catheterization preparation requiring oxygen therapy, and developed acute respiratory distress, CHF on chest x-ray was noted. The patient underwent cardiac catheterization on 21st of August 2017 by Dr. Welton Flakes, revealing no obstructive disease, stented mid LAD showed no significant stenosis and moderate disease in distal LAD as well as OM1. Stented mid RCA was noted to have 35% stenosis. Patient was recommended to continue medical therapy. Since she was allergic to statins, WelChol was initiated. Lipid panel was performed, revealing total cholesterol of 215, triglycerides of 186, LDL 115. Patient had to go to urgent dialysis session post cardiac catheterization due to fluid overload, she also underwent hemodialysis on the 11/01/2015, her oxygen is being weaned down, and if her O2 sats remained stable, she will likely be discharged home today. Discussion by problem: #1. Non-Q-wave MI with minimal elevation of troponin to 0.27 at max, status post recent cardiac catheterization with mid LAD stent placement in June 2017 by Dr. Welton Flakes, status post cardiac catheterization 11/20/2015, Stent in the mid LAD is patent with moderate disease in the distal LAD as well as the first OM, advised medical therapy and f/u Monday 2 pm in cardiologist's office, appreciate cardiologist input, continue Plavix, aspirin, metoprolol, the patient is apparently allergic to statin. Patient's LDL was found to be elevated at 115, triglycerides at 186, initiated  WelChol. Follow patient's hemoglobin level very closely on current medications, as I suspect the patient's elevation of troponin could be related to severe anemia. #2. Essential hypertension, continue metoprolol, lisinopril, blood pressure has improved after dialysis #3. Hyperlipidemia, initiated WelChol, patient is allergic to statin #4 anemia, Hemoccult is ordered, not obtained during this hospitalization, hemoglobin level dropped down to 7.4, with  rehydration/fluid overload, recheck after dialysis, no bleeding was noted  #5. End-stage renal disease, continue dialysis per schedule, which is Tuesdays, Thursdays, Saturdays.  #6. Acute respiratory failure with hypoxia due to acute diastolic CHF, chest x-ray revealed mild cardiomegaly, small left effusion, wean off oxygen therapy after dialysis 2 days and the row, possible discharge home if comfortable on room air, discussed with Dr. Wynelle Link today  #7acute diastolic CHF due to fluid overload as preparation for cardiac catheterization, dialysis was performed twice 2 days in a row by nephrologist, improved, appreciate input , continue Zaroxolyn, torsemide #8. MRSA carrier, continue mupirocin for week D ISCHARGE CONDITIONS:     VITAL SIGNS:  Blood pressure [name])  159/54, pulse 81, temperature 99 F (37.2 C), temperature source Oral, resp. rate 18, height 5\' 9"  (1.753 m), weight 120.3 kg (265 lb 3.4 oz), last menstrual period 03/16/2015, SpO2 99 %.  I/O:   Intake/Output Summary (Last 24 hours) at 11/21/15 1434 Last data filed at 11/21/15 1304  Gross per 24 hour  Intake              200 ml  Output             3100 ml  Net            -2900 ml    PHYSICAL EXAMINATION:  GENERAL:  50 y.o.-year-old patient lying in the bed with no acute distress.  EYES: Pupils equal, round, reactive to light and accommodation. No scleral icterus. Extraocular muscles intact.  HEENT: Head atraumatic, normocephalic. Oropharynx and nasopharynx clear.  NECK:  Supple, no jugular venous distention. No thyroid enlargement, no tenderness.  LUNGS: Normal breath sounds bilaterally, no wheezing, rales,rhonchi or crepitation. No use of accessory muscles of respiration.  CARDIOVASCULAR: S1, S2 normal. No murmurs, rubs, or gallops.  ABDOMEN: Soft, non-tender, non-distended. Bowel sounds present. No organomegaly or mass.  EXTREMITIES: No pedal edema, cyanosis, or clubbing.  NEUROLOGIC: Cranial nerves II through XII are intact.  Muscle strength 5/5 in all extremities. Sensation intact. Gait not checked.  PSYCHIATRIC: The patient is alert and oriented x 3.  SKIN: No obvious rash, lesion, or ulcer.   DATA REVIEW:   CBC  Recent Labs Lab 11/20/15 0502 11/21/15 0514  WBC 5.9  --   HGB 7.6* 7.4*  HCT 22.5*  --   PLT 153  --     Chemistries   Recent Labs Lab 11/19/15 1547 11/20/15 0502  NA  --  134*  K  --  3.3*  CL  --  100*  CO2  --  32  GLUCOSE  --  118*  BUN  --  23*  CREATININE  --  3.77*  CALCIUM  --  8.8*  MG 1.9  --     Cardiac Enzymes  Recent Labs Lab 11/19/15 1547  TROPONINI 0.13*    Microbiology Results  Results for orders placed or performed during the hospital encounter of 11/18/15  MRSA PCR Screening     Status: Abnormal   Collection Time: 11/19/15  2:01 AM  Result Value Ref Range Status   MRSA by PCR POSITIVE (A) NEGATIVE Final    Comment:        The GeneXpert MRSA Assay (FDA approved for NASAL specimens only), is one component of a comprehensive MRSA colonization surveillance program. It is not intended to diagnose MRSA infection nor to guide or monitor treatment for MRSA infections. RESULT CALLED TO, READ BACK BY AND VERIFIED WITHNormajean Glasgow AT 1610 11/19/15 SDR     RADIOLOGY:  Dg Chest 2 View  Result Date: 11/20/2015 CLINICAL DATA:  Left side chest pain, shortness of breath. EXAM: CHEST  2 VIEW COMPARISON:  11/18/2015 FINDINGS: Right dialysis catheter remains in place, unchanged. Mild cardiomegaly. No confluent opacities. Small left pleural effusion. No edema. No acute bony abnormality. IMPRESSION: Mild cardiomegaly. Small left effusion. Electronically Signed   By: Charlett Nose M.D.   On: 11/20/2015 09:26    EKG:   Orders placed or performed during the hospital encounter of 11/18/15  . ED EKG within 10 minutes  . ED EKG within 10 minutes  . EKG 12-Lead (at 6am)  . EKG 12-Lead (Repeat cardiac markers, recurrent  chest pain)  . EKG 12-Lead (at 6am)   . EKG 12-Lead (Repeat cardiac markers, recurrent chest pain)  . EKG 12-Lead  . EKG 12-Lead      Management plans discussed with the patient, family and they are in agreement.  CODE STATUS:     Code Status Orders        Start     Ordered   11/19/15 0118  Full code  Continuous     11/19/15 0117    Code Status History    Date Active Date Inactive Code Status Order ID Comments User Context   10/06/2015  2:08 AM 10/06/2015  6:32 PM Full Code [id number]  Ihor Austin, MD Inpatient   09/13/2015  2:14 AM 09/15/2015  5:28 PM Full Code [id number]  Arnaldo Natal, MD Inpatient   08/24/2015  3:50 AM 08/31/2015  6:47 PM Full Code [id number]  Ihor Austin, MD Inpatient   07/16/2015  6:26 AM 08/02/2015  6:48 PM Full Code [id number]  Ihor Austin, MD ED   07/10/2015  1:49 AM 07/12/2015  1:01 PM Full Code [id number]  Oralia Manis, MD Inpatient   05/12/2015  3:46 PM 05/16/2015  5:35 PM Full Code [id number]  Auburn Bilberry, MD ED      TOTAL TIME TAKING CARE OF THIS PAT40 minutes.    Katharina Caper M.D on 11/21/2015 at 2:34 PM  Between 7am to 6pm - Pager - [phone]  After 6pm go to [email or web address] - password [email or web address] [facility]  [facility]  Office  [phone]  CC: Primary care physician; Jarome Matin, MD

## 2015-11-21 NOTE — Progress Notes (Signed)
[facility]  ROUNDING NOTE   Subjective:   Left LAD stent placed.  Dialysis yesterday due to pulmonary edema and contrast exposure. UF of 2 litres  Seen on hemodialysis today. Tolerating treatment well. UF goal of 1 litre  Objective:  Vital signs in last 24 hours:  Temp:  [97.5 F (36.4 C)-98.9 F (37.2 C)] 98.2 F (36.8 C) (08/22 0950) Pulse Rate:  [62-89] 75 (08/22 1052) Resp:  [13-24] 15 (08/22 1052) BP: (80-169)/(40-98) 110/50 (08/22 1052) SpO2:  [92 %-100 %] 100 % (08/22 1052) Weight:  [120.3 kg (265 lb 3.4 oz)-125.1 kg (275 lb 12.7 oz)] 120.3 kg (265 lb 3.4 oz) (08/22 0950)  Weight change:  Filed Weights   11/20/15 1510 11/20/15 1834 11/21/15 0950  Weight: 125.1 kg (275 lb 12.7 oz) 123 kg (271 lb 2.7 oz) 120.3 kg (265 lb 3.4 oz)    Intake/Output: I/O last 3 completed shifts: In: 1944.2 [P.O.:200; I.V.:1744.2] Out: 2950 [Urine:950; Other:2000]   Intake/Output this shift:  No intake/output data recorded.  Physical Exam: General: NAD, laying in bed  Head: Normocephalic, atraumatic. Moist oral mucosal membranes  Eyes: Anicteric, PERRL  Neck: Supple, trachea midline  Lungs:  Bilateral crackles diffuse  Heart: Regular rate and rhythm  Abdomen:  Soft, nontender,   Extremities: No peripheral edema, right BKA  Neurologic: Nonfocal, moving all four extremities  Skin: No lesions  Access: RIJ permcath    Basic Metabolic Panel:  Recent Labs Lab 11/18/15 2133 11/19/15 1547 11/20/15 0502  NA 139  --  134*  K 3.2*  --  3.3*  CL 102  --  100*  CO2 34*  --  32  GLUCOSE 148*  --  118*  BUN 15  --  23*  CREATININE 2.90*  --  3.77*  CALCIUM 9.1  --  8.8*  MG  --  1.9  --     Liver Function Tests: No results for input(s): AST, ALT, ALKPHOS, BILITOT, PROT, ALBUMIN in the last 168 hours. No results for input(s): LIPASE, AMYLASE in the last 168 hours. No results for input(s): AMMONIA in the last 168 hours.  CBC:  Recent Labs Lab 11/18/15 2133  11/20/15 0502 11/21/15 0514  WBC 5.9 5.9  --   HGB 9.5* 7.6* 7.4*  HCT 27.6* 22.5*  --   MCV 92.9 96.2  --   PLT 177 153  --     Cardiac Enzymes:  Recent Labs Lab 11/18/15 2133 11/19/15 0617 11/19/15 0936 11/19/15 1547  TROPONINI 0.07* 0.11* 0.12* 0.13*    BNP: Invalid input(s): POCBNP  CBG:  Recent Labs Lab 11/20/15 1114 11/20/15 2115 11/20/15 2347 11/21/15 0418 11/21/15 0840  GLUCAP 97 166* 156* 125* 83    Microbiology: Results for orders placed or performed during the hospital encounter of 11/18/15  MRSA PCR Screening     Status: Abnormal   Collection Time: 11/19/15  2:01 AM  Result Value Ref Range Status   MRSA by PCR POSITIVE (A) NEGATIVE Final    Comment:        The GeneXpert MRSA Assay (FDA approved for NASAL specimens only), is one component of a comprehensive MRSA colonization surveillance program. It is not intended to diagnose MRSA infection nor to guide or monitor treatment for MRSA infections. RESULT CALLED TO, READ BACK BY AND VERIFIED WITHNormajean Montgomery:  Kimberly Montgomery AT [id number] 11/19/15 SDR     Coagulation Studies:  Recent Labs  11/19/15 1421  LABPROT 12.8  INR 0.96    Urinalysis: No results  for input(s): COLORURINE, LABSPEC, PHURINE, GLUCOSEU, HGBUR, BILIRUBINUR, KETONESUR, PROTEINUR, UROBILINOGEN, NITRITE, LEUKOCYTESUR in the last 72 hours.  Invalid input(s): APPERANCEUR    Imaging: Dg Chest 2 View  Result Date: 11/20/2015 CLINICAL DATA:  Left side chest pain, shortness of breath. EXAM: CHEST  2 VIEW COMPARISON:  11/18/2015 FINDINGS: Right dialysis catheter remains in place, unchanged. Mild cardiomegaly. No confluent opacities. Small left pleural effusion. No edema. No acute bony abnormality. IMPRESSION: Mild cardiomegaly. Small left effusion. Electronically Signed   By: Kimberly Montgomery M.D.   On: 11/20/2015 09:26     Medications:     . calcium acetate  2,001 mg Oral TID WC  . calcium carbonate  1 tablet Oral TID WC  . Chlorhexidine  Gluconate Cloth  6 each Topical Q0600  . clopidogrel  75 mg Oral Daily  . colesevelam  625 mg Oral BID WC  . divalproex  1,000 mg Oral TID  . docusate sodium  100 mg Oral QHS  . erythromycin  250 mg Oral QID  . hydrALAZINE  100 mg Oral Q8H  . insulin aspart  0-24 Units Subcutaneous Q4H  . insulin glargine  8 Units Subcutaneous QHS  . isosorbide mononitrate  60 mg Oral Daily  . levETIRAcetam  500 mg Oral q morning - 10a  . metoCLOPramide  10 mg Oral QID  . metoprolol  50 mg Oral BID  . mupirocin ointment  1 application Nasal BID  . nicotine  14 mg Transdermal Daily  . nitroGLYCERIN  1 inch Topical Q6H  . nystatin  1 g Topical BID  . pantoprazole  80 mg Oral Daily  . polyethylene glycol  17 g Oral BID  . ranolazine  1,000 mg Oral BID  . risperiDONE  1 mg Oral TID  . sodium chloride flush  3 mL Intravenous Q12H   sodium chloride, acetaminophen, acetaminophen, bisacodyl, hydrALAZINE, lactulose, nitroGLYCERIN, ondansetron (ZOFRAN) IV, ondansetron, oxyCODONE, senna, sodium chloride flush  Assessment/ Plan:  [name] is a 50 y.o. white female with end-stage renal disease on hemodialysis, asthma, gastroparesis, diabetes mellitus type II, congestive heart failure, hypertension, endometriosis, coronary atherosclerosis  [facility]/ [facility] Kimberly Montgomery/ TTS RIJ permcath  1. End-stage renal disease on hemodialysis dialysis, TTHS: Seen and examined on hemodialysis treatment. Tolerating treatment well. Extra treatment yesterday due to pulmonary edema and contrast exposure.  - Continue TTS schedule.   2. Anemia of CKD:Low threshold for transfusion.  - EPO with treatment.   3. Hypertension:  - hydralazine, isosorbide mononitrate, metoprolol  4. Secondary Hyperparathyroidism: PTH 98 on 10/06/15 - Discontinued calcitriol - TUMS and calcium acetate.   LOS: 1 Kimberly Montgomery 8/22/201710:56 AM

## 2015-11-21 NOTE — Progress Notes (Signed)
Patient off the unit to dialysis unit report given to dialysis nurse prior to transport.

## 2015-11-21 NOTE — Progress Notes (Signed)
Patient returned to the unit from dialysis at this time. Denies any pain, vss foley catheter removed as per protocol . 500 mll fluid removed as per HD nurse, no acute distress noted

## 2015-11-21 NOTE — Clinical Social Work Note (Signed)
Patient to be d/c'ed today to [facility].  Patient and family agreeable to plans will transport via ems RN to call report to [phone].  Lundy Cozart, MSW Mon-Fri 8a-4:30p [phone]  

## 2015-11-21 NOTE — Care Management Important Message (Signed)
Important Message  Patient Details  Name: Kimberly Montgomery MRN: [id number] Date of Birth: [birth year]   Medicare Important Message Given:  Yes    Marily Memos, RN 11/21/2015, 1:27 PM

## 2015-11-21 NOTE — Progress Notes (Signed)
Pre Dialysis 

## 2015-11-21 NOTE — Progress Notes (Signed)
Post dialysis 

## 2015-11-21 NOTE — Progress Notes (Signed)
Patient is being discharged to [facility] health care center , discharge instruction provided to patient report called to receiving nurse, iv removed tele removed . EMS call for transport.

## 2015-11-21 NOTE — Progress Notes (Signed)
Patient was post heart cath. The cath site remained WDL without hematoma or c/o of pain> Patient VS remained stable overnight.

## 2015-11-21 NOTE — Progress Notes (Signed)
Dialysis complete

## 2015-11-21 NOTE — Progress Notes (Signed)
SUBJECTIVE: No chest pain   Vitals:   11/20/15 1948 11/20/15 2215 11/20/15 2349 11/21/15 0531  BP: (!) 112/49 136/73 (!) 152/59 (!) 132/51  Pulse: 83 89 83 64  Resp: 19   18  Temp: 98.9 F (37.2 C)   98.3 F (36.8 C)  TempSrc: Oral   Oral  SpO2: 98%   99%  Weight:      Height:        Intake/Output Summary (Last 24 hours) at 11/21/15 0813 Last data filed at 11/21/15 0000  Gross per 24 hour  Intake              200 ml  Output             2450 ml  Net            -2250 ml    LABS: Basic Metabolic Panel:  Recent Labs  02/01/14 2133 11/19/15 1547 11/20/15 0502  NA 139  --  134*  K 3.2*  --  3.3*  CL 102  --  100*  CO2 34*  --  32  GLUCOSE 148*  --  118*  BUN 15  --  23*  CREATININE 2.90*  --  3.77*  CALCIUM 9.1  --  8.8*  MG  --  1.9  --    Liver Function Tests: No results for input(s): AST, ALT, ALKPHOS, BILITOT, PROT, ALBUMIN in the last 72 hours. No results for input(s): LIPASE, AMYLASE in the last 72 hours. CBC:  Recent Labs  11/18/15 2133 11/20/15 0502 11/21/15 0514  WBC 5.9 5.9  --   HGB 9.5* 7.6* 7.4*  HCT 27.6* 22.5*  --   MCV 92.9 96.2  --   PLT 177 153  --    Cardiac Enzymes:  Recent Labs  11/19/15 0617 11/19/15 0936 11/19/15 1547  TROPONINI 0.11* 0.12* 0.13*   BNP: Invalid input(s): POCBNP D-Dimer: No results for input(s): DDIMER in the last 72 hours. Hemoglobin A1C: No results for input(s): HGBA1C in the last 72 hours. Fasting Lipid Panel:  Recent Labs  11/19/15 0617  CHOL 215*  HDL 63  LDLCALC 115*  TRIG 186*  CHOLHDL 3.4   Thyroid Function Tests:  Recent Labs  11/19/15 0617  TSH 8.570*  T4TOTAL 4.8   Anemia Panel: No results for input(s): VITAMINB12, FOLATE, FERRITIN, TIBC, IRON, RETICCTPCT in the last 72 hours.   PHYSICAL EXAM General: Well developed, well nourished, in no acute distress HEENT:  Normocephalic and atramatic Neck:  No JVD.  Lungs: Clear bilaterally to auscultation and percussion. Heart: HRRR  . Normal S1 and S2 without gallops or murmurs.  Abdomen: Bowel sounds are positive, abdomen soft and non-tender  Msk:  Back normal, normal gait. Normal strength and tone for age. Extremities: No clubbing, cyanosis or edema.   Neuro: Alert and oriented X 3. Psych:  Good affect, responds appropriately  TELEMETRY:Sinus rhythm  ASSESSMENT AND PLAN: Stent in the mid LAD is patent with moderate disease in the distal LAD as well as the first OM, advise medical therapy.treatment and f/u Monday 2 pm.    2

## 2015-12-05 ENCOUNTER — Encounter: Payer: Self-pay | Admitting: Emergency Medicine

## 2015-12-05 ENCOUNTER — Emergency Department: Payer: Medicare HMO

## 2015-12-05 ENCOUNTER — Emergency Department
Admission: EM | Admit: 2015-12-05 | Discharge: 2015-12-05 | Disposition: A | Discharge status: Home / Self Care | Payer: Medicare HMO | Attending: Student in an Organized Health Care Education/Training Program | Admitting: Student in an Organized Health Care Education/Training Program

## 2015-12-05 DIAGNOSIS — N186 End stage renal disease: Secondary | ICD-10-CM | POA: Insufficient documentation

## 2015-12-05 DIAGNOSIS — I5031 Acute diastolic (congestive) heart failure: Secondary | ICD-10-CM | POA: Insufficient documentation

## 2015-12-05 DIAGNOSIS — R079 Chest pain, unspecified: Secondary | ICD-10-CM

## 2015-12-05 DIAGNOSIS — E1122 Type 2 diabetes mellitus with diabetic chronic kidney disease: Secondary | ICD-10-CM | POA: Insufficient documentation

## 2015-12-05 DIAGNOSIS — I132 Hypertensive heart and chronic kidney disease with heart failure and with stage 5 chronic kidney disease, or end stage renal disease: Secondary | ICD-10-CM | POA: Diagnosis not present

## 2015-12-05 DIAGNOSIS — Z794 Long term (current) use of insulin: Secondary | ICD-10-CM | POA: Insufficient documentation

## 2015-12-05 DIAGNOSIS — J45909 Unspecified asthma, uncomplicated: Secondary | ICD-10-CM | POA: Diagnosis not present

## 2015-12-05 DIAGNOSIS — Z87891 Personal history of nicotine dependence: Secondary | ICD-10-CM | POA: Insufficient documentation

## 2015-12-05 DIAGNOSIS — I2510 Atherosclerotic heart disease of native coronary artery without angina pectoris: Secondary | ICD-10-CM | POA: Insufficient documentation

## 2015-12-05 DIAGNOSIS — Z992 Dependence on renal dialysis: Secondary | ICD-10-CM | POA: Diagnosis not present

## 2015-12-05 LAB — BASIC METABOLIC PANEL
ANION GAP: 4 — AB (ref 5–15)
BUN: 33 mg/dL — AB (ref 6–20)
CHLORIDE: 96 mmol/L — AB (ref 101–111)
CO2: 32 mmol/L (ref 22–32)
Calcium: 8.8 mg/dL — ABNORMAL LOW (ref 8.9–10.3)
Creatinine, Ser: 3.76 mg/dL — ABNORMAL HIGH (ref 0.44–1.00)
GFR calc Af Amer: 15 mL/min — ABNORMAL LOW (ref >60)
GFR, EST NON AFRICAN AMERICAN: 13 mL/min — AB (ref >60)
GLUCOSE: 124 mg/dL — AB (ref 65–99)
POTASSIUM: 3.8 mmol/L (ref 3.5–5.1)
Sodium: 132 mmol/L — ABNORMAL LOW (ref 135–145)

## 2015-12-05 LAB — CBC WITH DIFFERENTIAL/PLATELET
BASOS ABS: 0 10*3/uL (ref 0–0.1)
Basophils Relative: 0 %
EOS PCT: 1 %
Eosinophils Absolute: 0 10*3/uL (ref 0–0.7)
HEMATOCRIT: 29.1 % — AB (ref 35.0–47.0)
HEMOGLOBIN: 9.8 g/dL — AB (ref 12.0–16.0)
LYMPHS ABS: 0.6 10*3/uL — AB (ref 1.0–3.6)
LYMPHS PCT: 15 %
MCH: 32.6 pg (ref 26.0–34.0)
MCHC: 33.6 g/dL (ref 32.0–36.0)
MCV: 97.1 fL (ref 80.0–100.0)
Monocytes Absolute: 0.4 10*3/uL (ref 0.2–0.9)
Monocytes Relative: 10 %
NEUTROS ABS: 3.3 10*3/uL (ref 1.4–6.5)
Neutrophils Relative %: 74 %
Platelets: 233 10*3/uL (ref 150–440)
RBC: 2.99 MIL/uL — AB (ref 3.80–5.20)
RDW: 16 % — ABNORMAL HIGH (ref 11.5–14.5)
WBC: 4.4 10*3/uL (ref 3.6–11.0)

## 2015-12-05 LAB — TROPONIN I
Troponin I: 0.03 ng/mL (ref <0.03)
Troponin I: 0.04 ng/mL (ref <0.03)

## 2015-12-05 MED ORDER — METOPROLOL TARTRATE 50 MG PO TABS
50.0000 mg | ORAL_TABLET | Freq: Once | ORAL | Status: AC
  Administered 2015-12-05: 50 mg via ORAL
  Filled 2015-12-05: qty 1

## 2015-12-05 MED ORDER — HYDRALAZINE HCL 50 MG PO TABS
25.0000 mg | ORAL_TABLET | Freq: Once | ORAL | Status: AC
  Administered 2015-12-05: 25 mg via ORAL
  Filled 2015-12-05: qty 1

## 2015-12-05 MED ORDER — MORPHINE SULFATE (PF) 4 MG/ML IV SOLN: 4.0000 mg | INTRAVENOUS | Status: DC | PRN

## 2015-12-05 MED ORDER — LORAZEPAM 2 MG/ML IJ SOLN
1.0000 mg | Freq: Once | INTRAMUSCULAR | Status: AC
  Administered 2015-12-05: 1 mg via INTRAVENOUS
  Filled 2015-12-05: qty 1

## 2015-12-05 MED ORDER — HYDROCODONE-ACETAMINOPHEN 5-325 MG PO TABS
1.0000 | ORAL_TABLET | Freq: Once | ORAL | Status: AC
  Administered 2015-12-05: 1 via ORAL
  Filled 2015-12-05: qty 1

## 2015-12-05 NOTE — ED Notes (Signed)
Pt waiting on ems for transport to nursing home

## 2015-12-05 NOTE — ED Notes (Signed)
D/c inst to ems.  Iv d'ced.  Pt alert.  Skin warm and dry.

## 2015-12-05 NOTE — ED Notes (Signed)
Troponin 0.03  Dr Roxan Hockey aware.

## 2015-12-05 NOTE — ED Triage Notes (Signed)
Chest pain x 2 hours 

## 2015-12-05 NOTE — ED Notes (Signed)
meds given.  nsr on monitor.  Skin warm and dry.  Iv in place.  siderails up x 2.

## 2015-12-05 NOTE — ED Provider Notes (Signed)
[facility] Emergency Department Provider Note    First MD Initiated Contact with Patient 12/05/15 1753     (approximate)  I have reviewed the triage vital signs and the nursing notes.   HISTORY  Chief Complaint No chief complaint on file.    HPI Kimberly Montgomery is a 50 y.o. female history of congestive heart failure CAD as well as end-stage renal disease on dialysis presents with recurrent episode of chest pain that occurs at the end of her dialysis sessions.  Patient states that this chest pain is similar to previous episode. States is midsternal without radiation. Currently rates it as a 7 out of 10. States that the pain generally resolves after dialysis within 3-4 hours with rest as well as pain medications. She denies any diaphoresis. No nausea or vomiting. Pain is not worsened with any exertion. She has chronic respiratory failure requiring some oxygen. She denies any fevers. Further questioning the patient states that she is most concerned that she is going to have another seizure.  When asked more about this she's been having chronic bilateral upper extremity tremors for the past month and she is concerned that that is sign of an impending seizure.   Past Medical History:  Diagnosis Date  . Asthma   . CAD (coronary artery disease)   . CHF (congestive heart failure) (HCC)   . Diabetes mellitus without complication (HCC)   . Endometriosis   . ESRD (end stage renal disease) (HCC)   . Gastroparesis   . GERD (gastroesophageal reflux disease)   . HLD (hyperlipidemia)   . Hypertension   . Renal disorder     Patient Active Problem List   Diagnosis Date Noted  . Essential hypertension 11/21/2015  . Hyperlipidemia 11/21/2015  . Anemia 11/21/2015  . Acute respiratory failure with hypoxia (HCC) 11/21/2015  . Acute diastolic CHF (congestive heart failure) (HCC) 11/21/2015  . ESRD on dialysis (HCC) 11/21/2015  . 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 NSTEMI (non-ST  elevated myocardial infarction) (HCC) 11/20/2015  . Chest pain, rule out acute myocardial infarction 11/18/2015  . Bipolar I disorder, most recent episode depressed (HCC)   . Altered mental status 08/24/2015  . ESRD (end stage renal disease) (HCC)   . Ileus (HCC)   . Bipolar I disorder (HCC) 07/25/2015  . Seizures (HCC) 07/25/2015  . Peritonitis (HCC) 07/16/2015  . Unstable angina (HCC) 07/09/2015  . ESRD on peritoneal dialysis (HCC) 07/09/2015  . Accelerated hypertension 07/09/2015  . Type 2 diabetes mellitus (HCC) 07/09/2015  . CAD (coronary artery disease) 07/09/2015  . HLD (hyperlipidemia) 07/09/2015  . GERD (gastroesophageal reflux disease) 07/09/2015  . Chest pain 05/12/2015    Past Surgical History:  Procedure Laterality Date  . BELOW KNEE LEG AMPUTATION    . CARDIAC CATHETERIZATION Right 07/10/2015   Procedure: Left Heart Cath and Coronary Angiography;  Surgeon: Laurier Nancy, MD;  Location: [facility] INVASIVE CV LAB;  Service: Cardiovascular;  Laterality: Right;  . CARDIAC CATHETERIZATION Right 09/14/2015   Procedure: Left Heart Cath and Coronary Angiography;  Surgeon: Laurier Nancy, MD;  Location: [facility] INVASIVE CV LAB;  Service: Cardiovascular;  Laterality: Right;  . CARDIAC CATHETERIZATION N/A 09/14/2015   Procedure: Coronary Stent Intervention;  Surgeon: Alwyn Pea, MD;  Location: [facility] INVASIVE CV LAB;  Service: Cardiovascular;  Laterality: N/A;  . CARDIAC CATHETERIZATION Right 11/20/2015   Procedure: Left Heart Cath and Coronary Angiography;  Surgeon: Laurier Nancy, MD;  Location: [facility] INVASIVE CV LAB;  Service:  Cardiovascular;  Laterality: Right;  . hd fistula surgery with reversal    . HYSTEROTOMY    . pd tube inserstion    . PERIPHERAL VASCULAR CATHETERIZATION N/A 08/30/2015   Procedure: Dialysis/Perma Catheter Insertion;  Surgeon: Annice Needy, MD;  Location: [facility] INVASIVE CV LAB;  Service: Cardiovascular;  Laterality: N/A;    Prior to Admission medications     Medication Sig Start Date End Date Taking? Authorizing Provider  bisacodyl (DULCOLAX) 10 MG suppository Place 10 mg rectally daily as needed for moderate constipation. Use if no relief from Milk of Magnesia    Historical Provider, MD  Bismuth Tribromoph-Petrolatum (XEROFORM PETROLATUM DRESSING) PADS Apply 1 each topically every 3 (three) days.    Historical Provider, MD  calcitRIOL (ROCALTROL) 0.25 MCG capsule Take 0.25 mcg by mouth daily.    Historical Provider, MD  calcium acetate (PHOSLO) 667 MG capsule Take 3 capsules (2,001 mg total) by mouth 3 (three) times daily with meals. 07/22/15   Enedina Finner, MD  calcium carbonate (TUMS - DOSED IN MG ELEMENTAL CALCIUM) 500 MG chewable tablet Chew 1 tablet (200 mg of elemental calcium total) by mouth 3 (three) times daily with meals. 08/02/15   Adrian Saran, MD  clopidogrel (PLAVIX) 75 MG tablet Take 75 mg by mouth daily.    Historical Provider, MD  colesevelam (WELCHOL) 625 MG tablet Take 1 tablet (625 mg total) by mouth 2 (two) times daily with a meal. 11/21/15   Katharina Caper, MD  divalproex (DEPAKOTE) 500 MG DR tablet Take 1,000 mg by mouth 3 (three) times daily.    Historical Provider, MD  docusate sodium (COLACE) 100 MG capsule Take 100 mg by mouth at bedtime.     Historical Provider, MD  erythromycin (E-MYCIN) 250 MG tablet Take 250 mg by mouth 4 (four) times daily.    Historical Provider, MD  hydrALAZINE (APRESOLINE) 25 MG tablet Take 25 mg by mouth every evening.    Historical Provider, MD  insulin aspart (NOVOLOG) 100 UNIT/ML injection Inject 0-9 Units into the skin 3 (three) times daily with meals. 15 units three times a day with meals, 5 units with snacks    Historical Provider, MD  insulin glargine (LANTUS) 100 UNIT/ML injection Inject 0.08 mLs (8 Units total) into the skin at bedtime. 08/30/15   Milagros Loll, MD  isosorbide mononitrate (IMDUR) 60 MG 24 hr tablet Take 1 tablet (60 mg total) by mouth daily. 07/11/15   Auburn Bilberry, MD  lactulose  (CHRONULAC) 10 GM/15ML solution Take 10 g by mouth daily as needed for mild constipation.    Historical Provider, MD  levETIRAcetam (KEPPRA) 500 MG tablet Take 1 tablet (500 mg total) by mouth every morning. 08/02/15   Adrian Saran, MD  lisinopril (PRINIVIL,ZESTRIL) 40 MG tablet Take 40 mg by mouth daily. 06/26/15   Historical Provider, MD  metoCLOPramide (REGLAN) 10 MG tablet Take 1 tablet (10 mg total) by mouth 4 (four) times daily. 08/02/15   Adrian Saran, MD  metolazone (ZAROXOLYN) 5 MG tablet Take 5 mg by mouth daily as needed (fluid).    Historical Provider, MD  metoprolol (LOPRESSOR) 50 MG tablet Take 1 tablet (50 mg total) by mouth 2 (two) times daily. 09/15/15   Milagros Loll, MD  mupirocin ointment (BACTROBAN) 2 % Place 1 application into the nose 2 (two) times daily. 11/21/15   Katharina Caper, MD  nicotine (NICODERM CQ - DOSED IN MG/24 HOURS) 14 mg/24hr patch Place 14 mg onto the skin daily.    Historical  Provider, MD  nitroGLYCERIN (NITROSTAT) 0.4 MG SL tablet Place 0.4 mg under the tongue every 5 (five) minutes as needed for chest pain. Reported on 07/10/2015    Historical Provider, MD  nystatin (MYCOSTATIN) powder Apply 1 g topically 2 (two) times daily.    Historical Provider, MD  omeprazole (PRILOSEC) 20 MG capsule Take 40 mg by mouth 2 (two) times daily before a meal.    Historical Provider, MD  ondansetron (ZOFRAN ODT) 4 MG disintegrating tablet Take 1 tablet (4 mg total) by mouth every 6 (six) hours as needed for nausea or vomiting. 07/16/15   Sharyn Creamer, MD  oxyCODONE (OXY IR/ROXICODONE) 5 MG immediate release tablet Take 1 tablet (5 mg total) by mouth every 6 (six) hours as needed for severe pain. 09/15/15   Srikar Sudini, MD  polyethylene glycol (MIRALAX / GLYCOLAX) packet Take 17 g by mouth daily. Patient taking differently: Take 17 g by mouth 2 (two) times daily.  08/02/15   Adrian Saran, MD  ranolazine (RANEXA) 500 MG 12 hr tablet Take 1 tablet (500 mg total) by mouth 2 (two) times daily.  07/11/15   Auburn Bilberry, MD  risperiDONE (RISPERDAL) 1 MG tablet Take 1 mg by mouth 3 (three) times daily.     Historical Provider, MD  senna (SENOKOT) 8.6 MG TABS tablet Take 2 tablets by mouth at bedtime as needed for mild constipation.    Historical Provider, MD  torsemide (DEMADEX) 100 MG tablet Take 100 mg by mouth daily.    Historical Provider, MD    Allergies Cephalosporins; Penicillins; Lamictal [lamotrigine]; Phenergan [promethazine hcl]; Pravastatin; and Sulfa antibiotics  Family History  Problem Relation Age of Onset  . CAD    . Diabetes    . Bipolar disorder    . Cervical cancer Mother     Social History Social History  Substance Use Topics  . Smoking status: Former Smoker    Packs/day: 0.50    Types: Cigarettes  . Smokeless tobacco: Never Used  . Alcohol use No    Review of Systems Patient denies headaches, rhinorrhea, blurry vision, numbness, shortness of breath, chest pain, edema, cough, abdominal pain, nausea, vomiting, diarrhea, dysuria, fevers, rashes or hallucinations unless otherwise stated above in HPI. ____________________________________________   PHYSICAL EXAM:  VITAL SIGNS: Vitals:   12/05/15 1931 12/05/15 2019  BP: (!) 167/90 (!) 194/63  Pulse:  88  Resp:  15  Temp:      Constitutional: Alert and oriented.Chronically ill-appearing, anxious, obese no acute distress Eyes: Conjunctivae are normal. PERRL. EOMI. Head: Atraumatic. Nose: No congestion/rhinnorhea. Mouth/Throat: Mucous membranes are moist.  Oropharynx non-erythematous. Neck: No stridor. Painless ROM. No cervical spine tenderness to palpation Hematological/Lymphatic/Immunilogical: No cervical lymphadenopathy. Cardiovascular: Normal rate, regular rhythm. Grossly normal heart sounds.  Good peripheral circulation. Respiratory: Normal respiratory effort.  No retractions. Diminished breath sounds secondary to habitus Gastrointestinal: Soft and nontender. No distention. No abdominal  bruits. No CVA tenderness. Genitourinary:  Musculoskeletal: No lower extremity tenderness nor edema.  No joint effusions. Neurologic:  Normal speech and language. No gross focal neurologic deficits are appreciated. No gait instability. Skin:  Skin is warm, dry and intact. No rash noted.  ____________________________________________   LABS (all labs ordered are listed, but only abnormal results are displayed)  Results for orders placed or performed during the hospital encounter of 12/05/15 (from the past 24 hour(s))  CBC with Differential/Platelet     Status: Abnormal   Collection Time: 12/05/15  6:21 PM  Result Value Ref Range  WBC 4.4 3.6 - 11.0 K/uL   RBC 2.99 (L) 3.80 - 5.20 MIL/uL   Hemoglobin 9.8 (L) 12.0 - 16.0 g/dL   HCT 16.129.1 (L) 09.635.0 - 04.547.0 %   MCV 97.1 80.0 - 100.0 fL   MCH 32.6 26.0 - 34.0 pg   MCHC 33.6 32.0 - 36.0 g/dL   RDW 40.916.0 (H) 81.111.5 - 91.414.5 %   Platelets 233 150 - 440 K/uL   Neutrophils Relative % 74 %   Neutro Abs 3.3 1.4 - 6.5 K/uL   Lymphocytes Relative 15 %   Lymphs Abs 0.6 (L) 1.0 - 3.6 K/uL   Monocytes Relative 10 %   Monocytes Absolute 0.4 0.2 - 0.9 K/uL   Eosinophils Relative 1 %   Eosinophils Absolute 0.0 0 - 0.7 K/uL   Basophils Relative 0 %   Basophils Absolute 0.0 0 - 0.1 K/uL  Basic metabolic panel     Status: Abnormal   Collection Time: 12/05/15  6:21 PM  Result Value Ref Range   Sodium 132 (L) 135 - 145 mmol/L   Potassium 3.8 3.5 - 5.1 mmol/L   Chloride 96 (L) 101 - 111 mmol/L   CO2 32 22 - 32 mmol/L   Glucose, Bld 124 (H) 65 - 99 mg/dL   BUN 33 (H) 6 - 20 mg/dL   Creatinine, Ser 7.823.76 (H) 0.44 - 1.00 mg/dL   Calcium 8.8 (L) 8.9 - 10.3 mg/dL   GFR calc non Af Amer 13 (L) >60 mL/min   GFR calc Af Amer 15 (L) >60 mL/min   Anion gap 4 (L) 5 - 15  Troponin I     Status: Abnormal   Collection Time: 12/05/15  6:21 PM  Result Value Ref Range   Troponin I 0.03 (HH) <0.03 ng/mL  Troponin I     Status: Abnormal   Collection Time: 12/05/15   8:34 PM  Result Value Ref Range   Troponin I 0.04 (HH) <0.03 ng/mL   ____________________________________________  EKG My review and personal interpretation at Time: 18:02   Indication: chest pain  Rate: 80  Rhythm: nsr Axis: normal Other: no acute ischemic changes ____________________________________________  RADIOLOGY  CXR  IMPRESSION: Interval decrease in the size of the left pleural effusion.  Stable mild cardiomegaly. ____________________________________________   PROCEDURES  Procedure(s) performed: none    Critical Care performed: no ____________________________________________   INITIAL IMPRESSION / ASSESSMENT AND PLAN / ED COURSE  Pertinent labs & imaging results that were available during my care of the patient were reviewed by me and considered in my medical decision making (see chart for details).  DDX: ACS, pericarditis, esophagitis, boerhaaves, pe, dissection, pna, bronchitis, costochondritis   Kimberly Montgomery is a 50 y.o. who presents to the ED with recurrent chest pain that seems to occur after dialysis. Patient also complaining of tremors and concern for impending seizure. Neuro exam is nonfocal. Patient does not have any acute respiratory distress. She is otherwise afebrile. Patient is at high risk for ACS therefore we'll proceed with further evaluation. If she is also on dialysis, The patient will be placed on continuous pulse oximetry and telemetry for monitoring.  Laboratory evaluation will be sent to evaluate for the above complaints.  Patient has equal pulses in bilateral upper extremities. Do not feel is clinically consistent with dissection.  Her abdominal exam is soft and benign. Do not feel this is consistent with the pain referred from the abdomen.   Clinical Course  Comment By Time  Renal function  without any evidence of indication for needing emergent dialysis. Troponin negative at 0.03. EKG without any signs of acute ischemia.  X-ray  without evidence of acute heart failure.  Willy Eddy, MD 09/05 1859  Patient with some improvement in symptoms after Ativan. On further review of medical chart patient had catheter on the 21st with only 30% and 50% stenosis. No interval EKG changes feel this represents ACS. We'll repeat troponins patient is high risk for demand ischemia and ACS in the setting of her chronic renal disease. No evidence of acute heart failure. Willy Eddy, MD 09/05 1949  Patient reassessed and currently chest pain-free.  Repeat EKG is without any evidence of acute ischemia and there is no evidence of any findings to suggest acute pericarditis. No significant elevation in her troponin. Based on her recent negative catheter this past month and recurrent nature of chest pain after dialysis do not feel this represents an acute cardiac pathology. No evidence of acute heart failure. No evidence of acute infectious process.  Have discussed with the patient and available family all diagnostics and treatments performed thus far and all questions were answered to the best of my ability. The patient demonstrates understanding and agreement with plan.  Willy Eddy, MD 09/05 2103     ____________________________________________   FINAL CLINICAL IMPRESSION(S) / ED DIAGNOSES  Final diagnoses:  Chest pain, unspecified chest pain type  ESRD (end stage renal disease) (HCC)      NEW MEDICATIONS STARTED DURING THIS VISIT:  New Prescriptions   No medications on file     Note:  This document was prepared using Dragon voice recognition software and may include unintentional dictation errors.    Willy Eddy, MD 12/05/15 [phone]

## 2015-12-07 ENCOUNTER — Encounter: Payer: Self-pay | Admitting: *Deleted

## 2015-12-07 ENCOUNTER — Emergency Department
Admission: EM | Admit: 2015-12-07 | Discharge: 2015-12-07 | Disposition: A | Discharge status: Home / Self Care | Payer: Medicare HMO | Attending: Emergency Medicine | Admitting: Emergency Medicine

## 2015-12-07 ENCOUNTER — Emergency Department: Payer: Medicare HMO

## 2015-12-07 DIAGNOSIS — E1122 Type 2 diabetes mellitus with diabetic chronic kidney disease: Secondary | ICD-10-CM | POA: Diagnosis not present

## 2015-12-07 DIAGNOSIS — I2510 Atherosclerotic heart disease of native coronary artery without angina pectoris: Secondary | ICD-10-CM | POA: Diagnosis not present

## 2015-12-07 DIAGNOSIS — Z992 Dependence on renal dialysis: Secondary | ICD-10-CM | POA: Diagnosis not present

## 2015-12-07 DIAGNOSIS — J45909 Unspecified asthma, uncomplicated: Secondary | ICD-10-CM | POA: Diagnosis not present

## 2015-12-07 DIAGNOSIS — R609 Edema, unspecified: Secondary | ICD-10-CM

## 2015-12-07 DIAGNOSIS — R079 Chest pain, unspecified: Secondary | ICD-10-CM | POA: Diagnosis present

## 2015-12-07 DIAGNOSIS — R600 Localized edema: Secondary | ICD-10-CM | POA: Insufficient documentation

## 2015-12-07 DIAGNOSIS — N186 End stage renal disease: Secondary | ICD-10-CM | POA: Diagnosis not present

## 2015-12-07 DIAGNOSIS — Z794 Long term (current) use of insulin: Secondary | ICD-10-CM | POA: Insufficient documentation

## 2015-12-07 DIAGNOSIS — I132 Hypertensive heart and chronic kidney disease with heart failure and with stage 5 chronic kidney disease, or end stage renal disease: Secondary | ICD-10-CM | POA: Insufficient documentation

## 2015-12-07 DIAGNOSIS — Z87891 Personal history of nicotine dependence: Secondary | ICD-10-CM | POA: Diagnosis not present

## 2015-12-07 DIAGNOSIS — I5031 Acute diastolic (congestive) heart failure: Secondary | ICD-10-CM | POA: Diagnosis not present

## 2015-12-07 LAB — BASIC METABOLIC PANEL
ANION GAP: 6 (ref 5–15)
BUN: 40 mg/dL — ABNORMAL HIGH (ref 6–20)
CALCIUM: 9.6 mg/dL (ref 8.9–10.3)
CO2: 30 mmol/L (ref 22–32)
CREATININE: 5.38 mg/dL — AB (ref 0.44–1.00)
Chloride: 97 mmol/L — ABNORMAL LOW (ref 101–111)
GFR, EST AFRICAN AMERICAN: 10 mL/min — AB (ref >60)
GFR, EST NON AFRICAN AMERICAN: 8 mL/min — AB (ref >60)
Glucose, Bld: 127 mg/dL — ABNORMAL HIGH (ref 65–99)
Potassium: 4.4 mmol/L (ref 3.5–5.1)
SODIUM: 133 mmol/L — AB (ref 135–145)

## 2015-12-07 LAB — CBC WITH DIFFERENTIAL/PLATELET
BASOS ABS: 0 10*3/uL (ref 0–0.1)
BASOS PCT: 1 %
EOS ABS: 0.1 10*3/uL (ref 0–0.7)
Eosinophils Relative: 1 %
HEMATOCRIT: 27.5 % — AB (ref 35.0–47.0)
Hemoglobin: 9.4 g/dL — ABNORMAL LOW (ref 12.0–16.0)
Lymphocytes Relative: 19 %
Lymphs Abs: 1.2 10*3/uL (ref 1.0–3.6)
MCH: 33.2 pg (ref 26.0–34.0)
MCHC: 34.3 g/dL (ref 32.0–36.0)
MCV: 96.8 fL (ref 80.0–100.0)
MONO ABS: 1 10*3/uL — AB (ref 0.2–0.9)
MONOS PCT: 15 %
NEUTROS ABS: 4.1 10*3/uL (ref 1.4–6.5)
NEUTROS PCT: 64 %
Platelets: 204 10*3/uL (ref 150–440)
RBC: 2.84 MIL/uL — ABNORMAL LOW (ref 3.80–5.20)
RDW: 16.1 % — AB (ref 11.5–14.5)
WBC: 6.4 10*3/uL (ref 3.6–11.0)

## 2015-12-07 LAB — TROPONIN I

## 2015-12-07 LAB — VALPROIC ACID LEVEL: VALPROIC ACID LVL: 62 ug/mL (ref 50.0–100.0)

## 2015-12-07 NOTE — ED Notes (Signed)
Pt returned from US, resting in bed in no distress 

## 2015-12-07 NOTE — ED Triage Notes (Signed)
Pt arrives via EMS from dialysis, pt was there to get tx when she complained of 6/10 chest pain, no dialysis was done, EMS gave 324 of ASa and 1 nitro in route, pt states pain is squeezing in nature, pt recently had stress test preformed, pt awake and alert upon arrival, states she has been on depakote for 1 month, arrives with tremors, hx of right BKA, pt is bed bound

## 2015-12-07 NOTE — ED Provider Notes (Addendum)
[facility] Emergency Department Provider Note   ____________________________________________   First MD Initiated Contact with Patient 12/07/15 [phone]     (approximate)  I have reviewed the triage vital signs and the nursing notes.   HISTORY  Chief Complaint Chest Pain   HPI Kimberly Montgomery is a 50 y.o. female with a history of end-stage renal disease on dialysis and CAD who is presenting with chest pain today. He says that this chest pain most recently has been going on for 2 weeks. She says that her pain is crushing and over the left lower sternum. She says that last for several minutes and is associated with shortness of breath. She denies any radiation. Denies any nausea or vomiting. Denies any worsening with exertion. She was sent from her dialysis center this morning prior to treatment because of the chest pain. She was given aspirin and nitroglycerin en route and is chest pain-free at this time. Patient is also complaining of a tremor which she says started about one month ago after beginning Depakote.   Past Medical History:  Diagnosis Date  . Asthma   . CAD (coronary artery disease)   . CHF (congestive heart failure) (HCC)   . Diabetes mellitus without complication (HCC)   . Endometriosis   . ESRD (end stage renal disease) (HCC)   . Gastroparesis   . GERD (gastroesophageal reflux disease)   . HLD (hyperlipidemia)   . Hypertension   . Renal disorder     Patient Active Problem List   Diagnosis Date Noted  . Essential hypertension 11/21/2015  . Hyperlipidemia 11/21/2015  . Anemia 11/21/2015  . Acute respiratory failure with hypoxia (HCC) 11/21/2015  . Acute diastolic CHF (congestive heart failure) (HCC) 11/21/2015  . ESRD on dialysis (HCC) 11/21/2015  . 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 NSTEMI (non-ST elevated myocardial infarction) (HCC) 11/20/2015  . Chest pain, rule out acute myocardial infarction 11/18/2015  . Bipolar I disorder, most recent  episode depressed (HCC)   . Altered mental status 08/24/2015  . ESRD (end stage renal disease) (HCC)   . Ileus (HCC)   . Bipolar I disorder (HCC) 07/25/2015  . Seizures (HCC) 07/25/2015  . Peritonitis (HCC) 07/16/2015  . Unstable angina (HCC) 07/09/2015  . ESRD on peritoneal dialysis (HCC) 07/09/2015  . Accelerated hypertension 07/09/2015  . Type 2 diabetes mellitus (HCC) 07/09/2015  . CAD (coronary artery disease) 07/09/2015  . HLD (hyperlipidemia) 07/09/2015  . GERD (gastroesophageal reflux disease) 07/09/2015  . Chest pain 05/12/2015    Past Surgical History:  Procedure Laterality Date  . BELOW KNEE LEG AMPUTATION    . CARDIAC CATHETERIZATION Right 07/10/2015   Procedure: Left Heart Cath and Coronary Angiography;  Surgeon: Laurier Nancy, MD;  Location: [facility] INVASIVE CV [facility];  Service: Cardiovascular;  Laterality: Right;  . CARDIAC CATHETERIZATION Right 09/14/2015   Procedure: Left Heart Cath and Coronary Angiography;  Surgeon: Laurier Nancy, MD;  Location: [facility]AS[facility] CV [facility];  Service: Cardiovascular;  Laterality: Right;  . CARDIAC CATHETERIZATION N/A 09/14/2015   Procedure: Coronary Stent Intervention;  Surgeon: Alwyn Pea, MD;  Location: [facility] CV [facility];  Service: Cardiovascular;  Laterality: N/A;  . CARDIAC CATHETERIZATION Right 11/20/2015   Procedure: Left Heart Cath and Coronary Angiography;  Surgeon: Laurier Nancy, MD;  Location: [facility] IN[facility] CV [facility];  Service: Cardiovascular;  Laterality: Right;  . hd fistula surgery with reversal    . HYSTEROTOMY    . pd tube inserstion    .  PERIPHERAL VASCULAR CATHETERIZATION N/A 08/30/2015   Procedure: Dialysis/Perma Catheter Insertion;  Surgeon: Annice Needy, MD;  Location: [facility] CV [facility];  Service: Cardiovascular;  Laterality: N/A;    Prior to Admission medications   Medication Sig Start Date End Date Taking? Authorizing Provider  bisacodyl (DULCOLAX) 10 MG suppository Place 10 mg rectally daily as needed for  moderate constipation. Use if no relief from Milk of Magnesia    Historical Provider, MD  Bismuth Tribromoph-Petrolatum (XEROFORM PETROLATUM DRESSING) PADS Apply 1 each topically every 3 (three) days.    Historical Provider, MD  calcitRIOL (ROCALTROL) 0.25 MCG capsule Take 0.25 mcg by mouth daily.    Historical Provider, MD  calcium acetate (PHOSLO) 667 MG capsule Take 3 capsules (2,001 mg total) by mouth 3 (three) times daily with meals. 07/22/15   Enedina Finner, MD  calcium carbonate (TUMS - DOSED IN MG ELEMENTAL CALCIUM) 500 MG chewable tablet Chew 1 tablet (200 mg of elemental calcium total) by mouth 3 (three) times daily with meals. 08/02/15   Adrian Saran, MD  clopidogrel (PLAVIX) 75 MG tablet Take 75 mg by mouth daily.    Historical Provider, MD  colesevelam (WELCHOL) 625 MG tablet Take 1 tablet (625 mg total) by mouth 2 (two) times daily with a meal. 11/21/15   Katharina Caper, MD  divalproex (DEPAKOTE) 500 MG DR tablet Take 1,000 mg by mouth 3 (three) times daily.    Historical Provider, MD  docusate sodium (COLACE) 100 MG capsule Take 100 mg by mouth at bedtime.     Historical Provider, MD  erythromycin (E-MYCIN) 250 MG tablet Take 250 mg by mouth 4 (four) times daily.    Historical Provider, MD  hydrALAZINE (APRESOLINE) 25 MG tablet Take 25 mg by mouth every evening.    Historical Provider, MD  insulin aspart (NOVOLOG) 100 UNIT/ML injection Inject 0-9 Units into the skin 3 (three) times daily with meals. 15 units three times a day with meals, 5 units with snacks    Historical Provider, MD  insulin glargine (LANTUS) 100 UNIT/ML injection Inject 0.08 mLs (8 Units total) into the skin at bedtime. 08/30/15   Milagros Loll, MD  isosorbide mononitrate (IMDUR) 60 MG 24 hr tablet Take 1 tablet (60 mg total) by mouth daily. 07/11/15   Auburn Bilberry, MD  lactulose (CHRONULAC) 10 GM/15ML solution Take 10 g by mouth daily as needed for mild constipation.    Historical Provider, MD  levETIRAcetam (KEPPRA) 500 MG  tablet Take 1 tablet (500 mg total) by mouth every morning. 08/02/15   Adrian Saran, MD  lisinopril (PRINIVIL,ZESTRIL) 40 MG tablet Take 40 mg by mouth daily. 06/26/15   Historical Provider, MD  metoCLOPramide (REGLAN) 10 MG tablet Take 1 tablet (10 mg total) by mouth 4 (four) times daily. 08/02/15   Adrian Saran, MD  metolazone (ZAROXOLYN) 5 MG tablet Take 5 mg by mouth daily as needed (fluid).    Historical Provider, MD  metoprolol (LOPRESSOR) 50 MG tablet Take 1 tablet (50 mg total) by mouth 2 (two) times daily. 09/15/15   Milagros Loll, MD  mupirocin ointment (BACTROBAN) 2 % Place 1 application into the nose 2 (two) times daily. 11/21/15   Katharina Caper, MD  nicotine (NICODERM CQ - DOSED IN MG/24 HOURS) 14 mg/24hr patch Place 14 mg onto the skin daily.    Historical Provider, MD  nitroGLYCERIN (NITROSTAT) 0.4 MG SL tablet Place 0.4 mg under the tongue every 5 (five) minutes as needed for chest pain. Reported on 07/10/2015  Historical Provider, MD  nystatin (MYCOSTATIN) powder Apply 1 g topically 2 (two) times daily.    Historical Provider, MD  omeprazole (PRILOSEC) 20 MG capsule Take 40 mg by mouth 2 (two) times daily before a meal.    Historical Provider, MD  ondansetron (ZOFRAN ODT) 4 MG disintegrating tablet Take 1 tablet (4 mg total) by mouth every 6 (six) hours as needed for nausea or vomiting. 07/16/15   Sharyn Creamer, MD  oxyCODONE (OXY IR/ROXICODONE) 5 MG immediate release tablet Take 1 tablet (5 mg total) by mouth every 6 (six) hours as needed for severe pain. 09/15/15   Srikar Sudini, MD  polyethylene glycol (MIRALAX / GLYCOLAX) packet Take 17 g by mouth daily. Patient taking differently: Take 17 g by mouth 2 (two) times daily.  08/02/15   Adrian Saran, MD  ranolazine (RANEXA) 500 MG 12 hr tablet Take 1 tablet (500 mg total) by mouth 2 (two) times daily. 07/11/15   Auburn Bilberry, MD  risperiDONE (RISPERDAL) 1 MG tablet Take 1 mg by mouth 3 (three) times daily.     Historical Provider, MD  senna (SENOKOT)  8.6 MG TABS tablet Take 2 tablets by mouth at bedtime as needed for mild constipation.    Historical Provider, MD  torsemide (DEMADEX) 100 MG tablet Take 100 mg by mouth daily.    Historical Provider, MD    Allergies Cephalosporins; Penicillins; Lamictal [lamotrigine]; Phenergan [promethazine hcl]; Pravastatin; and Sulfa antibiotics  Family History  Problem Relation Age of Onset  . CAD    . Diabetes    . Bipolar disorder    . Cervical cancer Mother     Social History Social History  Substance Use Topics  . Smoking status: Former Smoker    Packs/day: 0.50    Types: Cigarettes  . Smokeless tobacco: Never Used  . Alcohol use No    Review of Systems Constitutional: No fever/chills Eyes: No visual changes. ENT: No sore throat. Cardiovascular: As above Respiratory: As above Gastrointestinal: No abdominal pain.  No nausea, no vomiting.  No diarrhea.  No constipation. Genitourinary: Negative for dysuria. Musculoskeletal: Negative for back pain. Skin: Negative for rash. Neurological: Negative for headaches, focal weakness or numbness.  10-point ROS otherwise negative.  ____________________________________________   PHYSICAL EXAM:  VITAL SIGNS: ED Triage Vitals  Enc Vitals Group     BP      Pulse      Resp      Temp      Temp src      SpO2      Weight      Height      Head Circumference      Peak Flow      Pain Score      Pain Loc      Pain Edu?      Excl. in GC?     Constitutional: Alert and oriented. Well appearing and in no acute distress. Eyes: Conjunctivae are normal. PERRL. EOMI. Head: Atraumatic. Nose: No congestion/rhinnorhea. Mouth/Throat: Mucous membranes are moist.  Oropharynx non-erythematous. Neck: No stridor.   Cardiovascular: Normal rate, regular rhythm. Grossly normal heart sounds.  Chest pain is not reproducible with palpation. Right-sided permacath without any surrounding erythema, induration pus or tenderness to palpation. Respiratory:  Normal respiratory effort.  No retractions. Lungs CTAB. Gastrointestinal: Soft and nontender. No distention. Right-sided PD catheter without any surrounding tenderness, erythema, induration or pus. Musculoskeletal: Left lower extremity edema. Also with left upper 70 edema which she says is new over  the past day. The left lower extremity edema is chronic.Right lower extremity BKA Neurologic:  Normal speech and language. No gross focal neurologic deficits are appreciated. Skin:  Skin is warm, dry and intact. No rash noted. Psychiatric: Mood and affect are normal. Speech and behavior are normal.  ____________________________________________   LABS (all labs ordered are listed, but only abnormal results are displayed)  Labs Reviewed  CBC WITH DIFFERENTIAL/PLATELET - Abnormal; Notable for the following:       Result Value   RBC 2.84 (*)    Hemoglobin 9.4 (*)    HCT 27.5 (*)    RDW 16.1 (*)    Monocytes Absolute 1.0 (*)    All other components within normal limits  BASIC METABOLIC PANEL - Abnormal; Notable for the following:    Sodium 133 (*)    Chloride 97 (*)    Glucose, Bld 127 (*)    BUN 40 (*)    Creatinine, Ser 5.38 (*)    GFR calc non Af Amer 8 (*)    GFR calc Af Amer 10 (*)    All other components within normal limits  TROPONIN I  VALPROIC ACID LEVEL   ____________________________________________  EKG  ED ECG REPORT I, Arelia LongestSchaevitz,  Gustavo Dispenza M, the attending physician, personally viewed and interpreted this ECG.   Date: 12/07/2015  EKG Time: 8  Rate: 73  Rhythm: normal sinus rhythm  Axis: Normal  Intervals:none  ST&T Change: No ST segment elevation or depression. No abnormal T-wave inversion.  ____________________________________________  RADIOLOGY US Venous Img Upper Uni Left (Accession [id number]) (Order [id number])  Imaging  Date: 12/07/2015 Department: [facility] EMERGENCY DEPARTMENT Released By/Authorizing: Myrna Blazeravid Matthew Sophira Rumler, MD  (auto-released)  PACS Images   Show images for US Venous Img Upper Uni Left  Study Result   CLINICAL DATA:  Left arm edema for 3 days  EXAM: LEFT UPPER EXTREMITY VENOUS DUPLEX ULTRASOUND  TECHNIQUE: Doppler venous assessment of the left lower extremity deep venous system was performed, including characterization of spectral flow, compressibility, and phasicity.  COMPARISON:  None.  FINDINGS: There is complete compressibility of the left jugular, subclavian, axillary, and brachial veins. Radial vein is compressible. Ulnar vein is poorly visualized but grossly compressible. There is no obvious cephalic or basilic vein thrombosis. Doppler analysis demonstrates a normal-appearing venous waveform. There is also augmentation with wrist compression.  IMPRESSION: No evidence of left upper extremity DVT. No evidence of left jugular vein DVT.   Electronically Signed   By: Jolaine ClickArthur  Hoss M.D.   On: 12/07/2015 09:28    DG Chest 1 View (Accession [id number]) (Order [id number])  Imaging  Date: 12/07/2015 [facility] Released By/Authorizing: Myrna Blazeravid Matthew Cherilyn Sautter, MD (auto-released)  PACS Images   Show images for DG Chest 1 View  Study Result   CLINICAL DATA:  Chest pain during dialysis  EXAM: CHEST 1 VIEW  COMPARISON:  12/05/2015  FINDINGS: Cardiac shadow is enlarged but stable from the prior exam. A right jugular dialysis catheter is again seen and stable. The lungs are clear bilaterally. No acute bony abnormality is seen.  IMPRESSION: No active disease.   Electronically Signed   By: Alcide CleverMark  Lukens M.D.   On: 12/07/2015 08:27     ____________________________________________   PROCEDURES  Procedure(s) performed:   Procedures  Critical Care performed:   ____________________________________________   INITIAL IMPRESSION / ASSESSMENT AND PLAN / ED COURSE  Pertinent labs & imaging results  that were available during my care of  the patient were reviewed by me and considered in my medical decision making (see chart for details).  ----------------------------------------- 9:40 AM on 12/07/2015 -----------------------------------------  Patient remains chest pain-free. Very reassuring workup. Negative troponin in the setting of chronic chest pain and  Clinical Course   ----------------------------------------- 10:03 AM on 12/07/2015 -----------------------------------------  Discussed case with Dr. Welton Flakes, the patient's cardiologist, who says that he is able to see her tomorrow at 2 PM. We will not be making any medication changes at this time. The patient will be discharged home. She understands the plan and is willing to comply. We also arranged through the patient's dialysis center that she'll be transported directly from the emergency department to dialysis where she'll be dialyzed today said she does not miss her session. Patient with unclear cause of her chest pain. Pain appears chronic and had a recent catheterization. Denies any pain or shortness of breath at this time. Unlikely be pulmonary was. Ultrasound also showing negative for DVT and left upper extremity with edema.  ____________________________________________   FINAL CLINICAL IMPRESSION(S) / ED DIAGNOSES  Final diagnoses:  Edema  Peripheral edema to the left upper extremity Chest pain. NEW MEDICATIONS STARTED DURING THIS VISIT:  New Prescriptions   No medications on file     Note:  This document was prepared using Dragon voice recognition software and may include unintentional dictation errors.    Myrna Blazer, MD 12/07/15 1005    Myrna Blazer, MD 12/07/15 1005

## 2015-12-07 NOTE — Care Management (Signed)
Patient presents to the ED from her dialysis center- [facility] on [address] Was sent to the ED according to patient because' "I have these tremors, my face shakes and I have chest pain.  She is a resident of [facility].  patient is to be discharged from the ED and can be seen at [facility] today.  Informed mario at the clinic that can anticipate arrival between 11-12 noon.  Spoke with Rodney Booze from [facility] and she will contact [facility] to pick up patient's wheelchair from the clinic, come to the ED and pick her up to transport to [facility].  [facility] will transport patient back to [facility] after treatment is completed.  There is concern verbalized that patient is bed bound.  Patient is known tot his CM and she routinely travel in a wheelchair van to her dialysis center.

## 2015-12-07 NOTE — ED Notes (Signed)
Patient transported to Ultrasound 

## 2015-12-24 ENCOUNTER — Emergency Department: Payer: Medicare HMO

## 2015-12-24 ENCOUNTER — Emergency Department
Admission: EM | Admit: 2015-12-24 | Discharge: 2015-12-24 | Disposition: A | Discharge status: Home / Self Care | Payer: Medicare HMO | Source: Home / Self Care | Attending: Emergency Medicine | Admitting: Emergency Medicine

## 2015-12-24 DIAGNOSIS — I2510 Atherosclerotic heart disease of native coronary artery without angina pectoris: Secondary | ICD-10-CM | POA: Insufficient documentation

## 2015-12-24 DIAGNOSIS — Z79899 Other long term (current) drug therapy: Secondary | ICD-10-CM

## 2015-12-24 DIAGNOSIS — R079 Chest pain, unspecified: Secondary | ICD-10-CM

## 2015-12-24 DIAGNOSIS — N186 End stage renal disease: Secondary | ICD-10-CM | POA: Insufficient documentation

## 2015-12-24 DIAGNOSIS — I132 Hypertensive heart and chronic kidney disease with heart failure and with stage 5 chronic kidney disease, or end stage renal disease: Secondary | ICD-10-CM

## 2015-12-24 DIAGNOSIS — J45909 Unspecified asthma, uncomplicated: Secondary | ICD-10-CM | POA: Insufficient documentation

## 2015-12-24 DIAGNOSIS — I4892 Unspecified atrial flutter: Secondary | ICD-10-CM

## 2015-12-24 DIAGNOSIS — I5031 Acute diastolic (congestive) heart failure: Secondary | ICD-10-CM

## 2015-12-24 DIAGNOSIS — Z992 Dependence on renal dialysis: Secondary | ICD-10-CM

## 2015-12-24 DIAGNOSIS — E1122 Type 2 diabetes mellitus with diabetic chronic kidney disease: Secondary | ICD-10-CM | POA: Insufficient documentation

## 2015-12-24 DIAGNOSIS — Z87891 Personal history of nicotine dependence: Secondary | ICD-10-CM | POA: Insufficient documentation

## 2015-12-24 DIAGNOSIS — Z794 Long term (current) use of insulin: Secondary | ICD-10-CM | POA: Insufficient documentation

## 2015-12-24 DIAGNOSIS — R0789 Other chest pain: Secondary | ICD-10-CM

## 2015-12-24 LAB — BASIC METABOLIC PANEL
Anion gap: 5 (ref 5–15)
BUN: 45 mg/dL — AB (ref 6–20)
CO2: 30 mmol/L (ref 22–32)
CREATININE: 4.51 mg/dL — AB (ref 0.44–1.00)
Calcium: 9.2 mg/dL (ref 8.9–10.3)
Chloride: 97 mmol/L — ABNORMAL LOW (ref 101–111)
GFR, EST AFRICAN AMERICAN: 12 mL/min — AB (ref >60)
GFR, EST NON AFRICAN AMERICAN: 10 mL/min — AB (ref >60)
Glucose, Bld: 115 mg/dL — ABNORMAL HIGH (ref 65–99)
Potassium: 4.2 mmol/L (ref 3.5–5.1)
SODIUM: 132 mmol/L — AB (ref 135–145)

## 2015-12-24 LAB — CBC
HCT: 28.1 % — ABNORMAL LOW (ref 35.0–47.0)
Hemoglobin: 9.6 g/dL — ABNORMAL LOW (ref 12.0–16.0)
MCH: 32.1 pg (ref 26.0–34.0)
MCHC: 34 g/dL (ref 32.0–36.0)
MCV: 94.3 fL (ref 80.0–100.0)
Platelets: 299 K/uL (ref 150–440)
RBC: 2.98 MIL/uL — ABNORMAL LOW (ref 3.80–5.20)
RDW: 14.4 % (ref 11.5–14.5)
WBC: 6.6 K/uL (ref 3.6–11.0)

## 2015-12-24 LAB — TROPONIN I
Troponin I: 0.04 ng/mL
Troponin I: 0.03 ng/mL

## 2015-12-24 MED ORDER — HYDRALAZINE HCL 50 MG PO TABS
25.0000 mg | ORAL_TABLET | Freq: Once | ORAL | Status: AC
  Administered 2015-12-24: 25 mg via ORAL
  Filled 2015-12-24: qty 1

## 2015-12-24 MED ORDER — MORPHINE SULFATE (PF) 2 MG/ML IV SOLN
2.0000 mg | Freq: Once | INTRAVENOUS | Status: AC
  Administered 2015-12-24: 2 mg via INTRAVENOUS
  Filled 2015-12-24: qty 1

## 2015-12-24 MED ORDER — METOPROLOL TARTRATE 50 MG PO TABS
50.0000 mg | ORAL_TABLET | Freq: Once | ORAL | Status: AC
  Administered 2015-12-24: 50 mg via ORAL
  Filled 2015-12-24: qty 1

## 2015-12-24 MED ORDER — ONDANSETRON HCL 4 MG/2ML IJ SOLN
4.0000 mg | Freq: Once | INTRAMUSCULAR | Status: AC
  Administered 2015-12-24: 4 mg via INTRAVENOUS
  Filled 2015-12-24: qty 2

## 2015-12-24 MED ORDER — LISINOPRIL 20 MG PO TABS
40.0000 mg | ORAL_TABLET | Freq: Once | ORAL | Status: AC
  Administered 2015-12-24: 40 mg via ORAL
  Filled 2015-12-24: qty 2

## 2015-12-24 MED ORDER — ASPIRIN 81 MG PO CHEW: 324.0000 mg | CHEWABLE_TABLET | Freq: Once | ORAL | Status: DC

## 2015-12-24 MED ORDER — ACETAMINOPHEN 325 MG PO TABS
650.0000 mg | ORAL_TABLET | Freq: Once | ORAL | Status: AC
  Administered 2015-12-24: 650 mg via ORAL
  Filled 2015-12-24: qty 2

## 2015-12-24 MED ORDER — ISOSORBIDE MONONITRATE ER 60 MG PO TB24
60.0000 mg | ORAL_TABLET | Freq: Once | ORAL | Status: DC
  Filled 2015-12-24: qty 1

## 2015-12-24 NOTE — ED Triage Notes (Signed)
Pt came to ED via EMS from [facility] c/o left sided chest pain. Pt on dialysis (Tues, Thurs, Saturday). Given 2 nitro and 324 asa via EMS. PT has right  bka.

## 2015-12-24 NOTE — ED Notes (Signed)
MD made aware of HTN, and provided BP meds prior to discharge.

## 2015-12-24 NOTE — Discharge Instructions (Signed)
You were evaluated for chest discomfort, and although no certain cause was found, your exam and evaluation are reassuring today.  After discussion with your cardiologist, Dr. Welton Flakes, he recommended he agreed to have you discharge from the ER and plans on seeing you in the office tomorrow. Please call the office if he cannot make this appointment to make a new appointment.  Return to the emergency department for any worsening condition including new or worsening chest pain, palpitations, dizziness, passing out, weakness, numbness, or any other symptoms concerning to you.

## 2015-12-24 NOTE — ED Provider Notes (Signed)
[facility] Emergency Department Provider Note ____________________________________________   I have reviewed the triage vital signs and the triage nursing note.  HISTORY  Chief Complaint Chest Pain   Historian Patient  HPI Kimberly Montgomery is a 50 y.o. female on home oxygen, from nursing facility, esrd with dialysis tths, dialyzed yesterday, with a history of prior LAD blockage with stent, follows with cardiologist Dr. Lennette BihariKohn, presents today with waking up this morning with chest pain radiating on the left side up to the left arm. This felt similar to when she was found to have a blockage and required stenting, as well as recurrent similar chest pain which after repeat catheterization in August showed no new occlusion.  No nausea or vomiting. No weakness or numbness or confusion. Pain is moderate.    Past Medical History:  Diagnosis Date  . Asthma   . CAD (coronary artery disease)   . CHF (congestive heart failure) (HCC)   . Diabetes mellitus without complication (HCC)   . Endometriosis   . ESRD (end stage renal disease) (HCC)   . Gastroparesis   . GERD (gastroesophageal reflux disease)   . HLD (hyperlipidemia)   . Hypertension   . Renal disorder     Patient Active Problem List   Diagnosis Date Noted  . Essential hypertension 11/21/2015  . Hyperlipidemia 11/21/2015  . Anemia 11/21/2015  . Acute respiratory failure with hypoxia (HCC) 11/21/2015  . Acute diastolic CHF (congestive heart failure) (HCC) 11/21/2015  . ESRD on dialysis (HCC) 11/21/2015  . 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 NSTEMI (non-ST elevated myocardial infarction) (HCC) 11/20/2015  . Chest pain, rule out acute myocardial infarction 11/18/2015  . Bipolar I disorder, most recent episode depressed (HCC)   . Altered mental status 08/24/2015  . ESRD (end stage renal disease) (HCC)   . Ileus (HCC)   . Bipolar I disorder (HCC) 07/25/2015  . Seizures (HCC) 07/25/2015  . Peritonitis (HCC)  07/16/2015  . Unstable angina (HCC) 07/09/2015  . ESRD on peritoneal dialysis (HCC) 07/09/2015  . Accelerated hypertension 07/09/2015  . Type 2 diabetes mellitus (HCC) 07/09/2015  . CAD (coronary artery disease) 07/09/2015  . HLD (hyperlipidemia) 07/09/2015  . GERD (gastroesophageal reflux disease) 07/09/2015  . Chest pain 05/12/2015    Past Surgical History:  Procedure Laterality Date  . BELOW KNEE LEG AMPUTATION    . CARDIAC CATHETERIZATION Right 07/10/2015   Procedure: Left Heart Cath and Coronary Angiography;  Surgeon: Laurier NancyShaukat A Khan, MD;  Location: [facility] INVASIVE CV LAB;  Service: Cardiovascular;  Laterality: Right;  . CARDIAC CATHETERIZATION Right 09/14/2015   Procedure: Left Heart Cath and Coronary Angiography;  Surgeon: Laurier NancyShaukat A Khan, MD;  Location: [facility] INVASIVE CV LAB;  Service: Cardiovascular;  Laterality: Right;  . CARDIAC CATHETERIZATION N/A 09/14/2015   Procedure: Coronary Stent Intervention;  Surgeon: Alwyn Peawayne D Callwood, MD;  Location: [facility] INVASIVE CV LAB;  Service: Cardiovascular;  Laterality: N/A;  . CARDIAC CATHETERIZATION Right 11/20/2015   Procedure: Left Heart Cath and Coronary Angiography;  Surgeon: Laurier NancyShaukat A Khan, MD;  Location: [facility] INVASIVE CV LAB;  Service: Cardiovascular;  Laterality: Right;  . hd fistula surgery with reversal    . HYSTEROTOMY    . pd tube inserstion    . PERIPHERAL VASCULAR CATHETERIZATION N/A 08/30/2015   Procedure: Dialysis/Perma Catheter Insertion;  Surgeon: Annice NeedyJason S Dew, MD;  Location: [facility] INVASIVE CV LAB;  Service: Cardiovascular;  Laterality: N/A;    Prior to Admission medications   Medication Sig Start Date End Date Taking?  Authorizing Provider  Amino Acids-Protein Hydrolys (FEEDING SUPPLEMENT, PRO-STAT SUGAR FREE 64,) LIQD Take 30 mLs by mouth 3 (three) times daily with meals.   Yes Historical Provider, MD  bisacodyl (DULCOLAX) 10 MG suppository Place 10 mg rectally daily as needed for moderate constipation. Use if no relief from Milk of  Magnesia   Yes Historical Provider, MD  calcitRIOL (ROCALTROL) 0.25 MCG capsule Take 0.25 mcg by mouth daily.   Yes Historical Provider, MD  calcium acetate (PHOSLO) 667 MG capsule Take 3 capsules (2,001 mg total) by mouth 3 (three) times daily with meals. 07/22/15  Yes Enedina Finner, MD  calcium carbonate (TUMS - DOSED IN MG ELEMENTAL CALCIUM) 500 MG chewable tablet Chew 1 tablet (200 mg of elemental calcium total) by mouth 3 (three) times daily with meals. 08/02/15  Yes Adrian Saran, MD  clopidogrel (PLAVIX) 75 MG tablet Take 75 mg by mouth daily.   Yes Historical Provider, MD  colesevelam (WELCHOL) 625 MG tablet Take 1 tablet (625 mg total) by mouth 2 (two) times daily with a meal. 11/21/15  Yes Katharina Caper, MD  docusate sodium (COLACE) 100 MG capsule Take 100 mg by mouth at bedtime.    Yes Historical Provider, MD  erythromycin (E-MYCIN) 250 MG tablet Take 250 mg by mouth 4 (four) times daily.   Yes Historical Provider, MD  hydrALAZINE (APRESOLINE) 25 MG tablet Take 25 mg by mouth daily.    Yes Historical Provider, MD  insulin aspart (NOVOLOG) 100 UNIT/ML injection Inject 0-10 Units into the skin 3 (three) times daily with meals. 0-150=0units 151-200=2units 201-250=4units 251-300=6units 301-350=8units 351-400=10 units   Yes Historical Provider, MD  insulin glargine (LANTUS) 100 UNIT/ML injection Inject 0.08 mLs (8 Units total) into the skin at bedtime. 08/30/15  Yes Srikar Sudini, MD  isosorbide mononitrate (IMDUR) 60 MG 24 hr tablet Take 1 tablet (60 mg total) by mouth daily. 07/11/15  Yes Auburn Bilberry, MD  lactulose (CHRONULAC) 10 GM/15ML solution Take 10 g by mouth daily as needed for mild constipation.   Yes Historical Provider, MD  levETIRAcetam (KEPPRA) 500 MG tablet Take 1 tablet (500 mg total) by mouth every morning. 08/02/15  Yes Sital Mody, MD  lisinopril (PRINIVIL,ZESTRIL) 40 MG tablet Take 40 mg by mouth daily. 06/26/15  Yes Historical Provider, MD  metoCLOPramide (REGLAN) 10 MG tablet Take  1 tablet (10 mg total) by mouth 4 (four) times daily. 08/02/15  Yes Adrian Saran, MD  metolazone (ZAROXOLYN) 5 MG tablet Take 5 mg by mouth daily as needed (fluid).   Yes Historical Provider, MD  metoprolol (LOPRESSOR) 50 MG tablet Take 1 tablet (50 mg total) by mouth 2 (two) times daily. 09/15/15  Yes Srikar Sudini, MD  mupirocin ointment (BACTROBAN) 2 % Place 1 application into the nose 2 (two) times daily. 11/21/15  Yes Katharina Caper, MD  nicotine (NICODERM CQ - DOSED IN MG/24 HOURS) 14 mg/24hr patch Place 14 mg onto the skin daily.   Yes Historical Provider, MD  nitroGLYCERIN (NITROSTAT) 0.4 MG SL tablet Place 0.4 mg under the tongue every 5 (five) minutes as needed for chest pain. Reported on 07/10/2015   Yes Historical Provider, MD  nystatin (MYCOSTATIN) powder Apply 1 g topically 2 (two) times daily.   Yes Historical Provider, MD  omeprazole (PRILOSEC) 20 MG capsule Take 40 mg by mouth 2 (two) times daily before a meal.   Yes Historical Provider, MD  ondansetron (ZOFRAN ODT) 4 MG disintegrating tablet Take 1 tablet (4 mg total) by mouth every 6 (  six) hours as needed for nausea or vomiting. 07/16/15  Yes Sharyn Creamer, MD  oxyCODONE (OXY IR/ROXICODONE) 5 MG immediate release tablet Take 1 tablet (5 mg total) by mouth every 6 (six) hours as needed for severe pain. 09/15/15  Yes Srikar Sudini, MD  polyethylene glycol (MIRALAX / GLYCOLAX) packet Take 17 g by mouth daily. 08/02/15  Yes Adrian Saran, MD  ranolazine (RANEXA) 500 MG 12 hr tablet Take 1 tablet (500 mg total) by mouth 2 (two) times daily. 07/11/15  Yes Auburn Bilberry, MD  risperiDONE (RISPERDAL) 1 MG tablet Take 1 mg by mouth 3 (three) times daily.    Yes Historical Provider, MD  senna (SENOKOT) 8.6 MG TABS tablet Take 2 tablets by mouth 2 (two) times daily.    Yes Historical Provider, MD  torsemide (DEMADEX) 100 MG tablet Take 100 mg by mouth daily.   Yes Historical Provider, MD  Bismuth Tribromoph-Petrolatum (XEROFORM PETROLATUM DRESSING) PADS Apply 1  each topically every 3 (three) days.    Historical Provider, MD    Allergies  Allergen Reactions  . Cephalosporins Anaphylaxis  . Penicillins Anaphylaxis and Other (See Comments)    Has patient had a PCN reaction causing immediate rash, facial/tongue/throat swelling, SOB or lightheadedness with hypotension: Yes Has patient had a PCN reaction causing severe rash involving mucus membranes or skin necrosis: No Has patient had a PCN reaction that required hospitalization No Has patient had a PCN reaction occurring within the last 10 years: No If all of the above answers are "NO", then may proceed with Cephalosporin use.  . Lamictal [Lamotrigine] Other (See Comments)    Reaction:  Hallucinations  . Phenergan [Promethazine Hcl] Nausea And Vomiting  . Pravastatin Other (See Comments)    Reaction:  Muscle pain   . Sulfa Antibiotics Other (See Comments)    Reaction:  Unknown     Family History  Problem Relation Age of Onset  . CAD    . Diabetes    . Bipolar disorder    . Cervical cancer Mother     Social History Social History  Substance Use Topics  . Smoking status: Former Smoker    Packs/day: 0.50    Types: Cigarettes  . Smokeless tobacco: Never Used  . Alcohol use No    Review of Systems  Constitutional: Negative for fever. Eyes: Negative for visual changes. ENT: Negative for sore throat. Cardiovascular: Positive for chest pain. Respiratory: Negative for shortness of breath. Gastrointestinal: Negative for abdominal pain, vomiting and diarrhea. Genitourinary: Negative for dysuria. Musculoskeletal: Negative for back pain. Skin: Negative for rash. Neurological: Negative for headache. 10 point Review of Systems otherwise negative ____________________________________________   PHYSICAL EXAM:  VITAL SIGNS: ED Triage Vitals  Enc Vitals Group     BP 12/24/15 0808 (!) 162/80     Pulse Rate 12/24/15 0808 82     Resp 12/24/15 0808 13     Temp 12/24/15 0808 97.7 F (36.5  C)     Temp Source 12/24/15 0805 Oral     SpO2 12/24/15 0806 100 %     Weight 12/24/15 0806 263 lb (119.3 kg)     Height 12/24/15 0806 5\' 10"  (1.778 m)     Head Circumference --      Peak Flow --      Pain Score --      Pain Loc --      Pain Edu? --      Excl. in GC? --      Constitutional: Alert  and oriented. Well appearing and in no distress. HEENT   Head: Normocephalic and atraumatic.      Eyes: Conjunctivae are normal. PERRL. Normal extraocular movements.      Ears:         Nose: No congestion/rhinnorhea.Wearing nasal cannula oxygen   Mouth/Throat: Mucous membranes are moist.   Neck: No stridor. Cardiovascular/Chest: Tachycardic, regular rhythm.  No murmurs, rubs, or gallops. Respiratory: Normal respiratory effort without tachypnea nor retractions. Breath sounds are clear and equal bilaterally. No wheezes/rales/rhonchi. Gastrointestinal: Soft. No distention, no guarding, no rebound. Nontender.    Genitourinary/rectal:Deferred Musculoskeletal: Nontender with normal range of motion in all extremities. No joint effusions.  No lower extremity tenderness.  No edema. Neurologic:  Normal speech and language. No gross or focal neurologic deficits are appreciated. Skin:  Skin is warm, dry and intact. No rash noted. Psychiatric: Mood and affect are normal. Speech and behavior are normal. Patient exhibits appropriate insight and judgment.   ____________________________________________  LABS (pertinent positives/negatives)  Labs Reviewed  BASIC METABOLIC PANEL - Abnormal; Notable for the following:       Result Value   Sodium 132 (*)    Chloride 97 (*)    Glucose, Bld 115 (*)    BUN 45 (*)    Creatinine, Ser 4.51 (*)    GFR calc non Af Amer 10 (*)    GFR calc Af Amer 12 (*)    All other components within normal limits  CBC - Abnormal; Notable for the following:    RBC 2.98 (*)    Hemoglobin 9.6 (*)    HCT 28.1 (*)    All other components within normal limits   TROPONIN I - Abnormal; Notable for the following:    Troponin I 0.04 (*)    All other components within normal limits  TROPONIN I - Abnormal; Notable for the following:    Troponin I 0.03 (*)    All other components within normal limits    ____________________________________________    EKG I, Governor Rooks, MD, the attending physician have personally viewed and interpreted all ECGs.  122 bpm. Atrial flutter with variable block. Nonspecific intraventricular conduction delay. LVH. Normal axis.  Repeat EKG at 77 bpm. Unclear rhythm, but appears to be likely atrial flutter. Nonspecific interventricular conduction delay. LVH. Normal axis. Nonspecific ST and T-wave ____________________________________________  RADIOLOGY All Xrays were viewed by me. Imaging interpreted by Radiologist.  Chest x-ray two-view:   IMPRESSION: 1. Small to moderate size left pleural effusion posteriorly at the lung bases. 2. Mild aortic atherosclerosis. 3. Minimal chronic interstitial lung disease. __________________________________________  PROCEDURES  Procedure(s) performed: None  Critical Care performed: None  ____________________________________________   ED COURSE / ASSESSMENT AND PLAN  Pertinent labs & imaging results that were available during my care of the patient were reviewed by me and considered in my medical decision making (see chart for details).   Ms. Israelson is here for evaluation of chest pain and has a history of coronary artery disease and stent. She's also had a recent evaluation for similar describe chest pain and a catheterization showing no acute finding.  EKG today shows what I think is atrial flutter, initially tachycardic, but then on her own down into the 70s. She is on Plavix as well as metoprolol. Does not appear to me that she has been diagnosed with atrial flutter or atrial fibrillation based on what I can find easily in the medical record system, however patient  thinks that she might have previously been  diagnosed with this.  No additional symptoms, and I don't really have an alternate diagnosis but this does not sound clearly like GI in nature, or pulmonary in nature.  Her initial troponin was 0.04 and she is a dialysis patient.  Repeat troponin 0.03  I spoke with patient's cardiologist Dr. Welton Flakes about hospital observation admission versus discharge and outpatient follow-up. He would like to see her tomorrow in the office.  I discussed this with the patient and she is comfortable with that. She is going to go back by ambulance because of chronic oxygen requirement.   CONSULTATIONS:   None   Patient / Family / Caregiver informed of clinical course, medical decision-making process, and agree with plan.   I discussed return precautions, follow-up instructions, and discharge instructions with patient and/or family.   ___________________________________________   FINAL CLINICAL IMPRESSION(S) / ED DIAGNOSES   Final diagnoses:  Atypical chest pain  Nonspecific chest pain  Atrial flutter, unspecified type [facility])              Note: This dictation was prepared with Dragon dictation. Any transcriptional errors that result from this process are unintentional    Governor Rooks, MD 12/24/15 1259

## 2015-12-24 NOTE — ED Notes (Signed)
Pt given food and drink. Eating with no issues.

## 2015-12-25 ENCOUNTER — Inpatient Hospital Stay
Admission: EM | Admit: 2015-12-25 | Discharge: 2016-01-04 | DRG: 853 | Disposition: A | Discharge status: Skilled Nursing Facility | Payer: Medicare HMO | Attending: Internal Medicine | Admitting: Internal Medicine

## 2015-12-25 ENCOUNTER — Emergency Department: Payer: Medicare HMO

## 2015-12-25 DIAGNOSIS — G40909 Epilepsy, unspecified, not intractable, without status epilepticus: Secondary | ICD-10-CM | POA: Diagnosis present

## 2015-12-25 DIAGNOSIS — M6281 Muscle weakness (generalized): Secondary | ICD-10-CM

## 2015-12-25 DIAGNOSIS — I248 Other forms of acute ischemic heart disease: Secondary | ICD-10-CM | POA: Diagnosis present

## 2015-12-25 DIAGNOSIS — R4182 Altered mental status, unspecified: Secondary | ICD-10-CM

## 2015-12-25 DIAGNOSIS — I2510 Atherosclerotic heart disease of native coronary artery without angina pectoris: Secondary | ICD-10-CM | POA: Diagnosis present

## 2015-12-25 DIAGNOSIS — N2581 Secondary hyperparathyroidism of renal origin: Secondary | ICD-10-CM | POA: Diagnosis present

## 2015-12-25 DIAGNOSIS — Z882 Allergy status to sulfonamides status: Secondary | ICD-10-CM

## 2015-12-25 DIAGNOSIS — E875 Hyperkalemia: Secondary | ICD-10-CM | POA: Diagnosis present

## 2015-12-25 DIAGNOSIS — E1143 Type 2 diabetes mellitus with diabetic autonomic (poly)neuropathy: Secondary | ICD-10-CM | POA: Diagnosis present

## 2015-12-25 DIAGNOSIS — Z7902 Long term (current) use of antithrombotics/antiplatelets: Secondary | ICD-10-CM

## 2015-12-25 DIAGNOSIS — B952 Enterococcus as the cause of diseases classified elsewhere: Secondary | ICD-10-CM | POA: Diagnosis present

## 2015-12-25 DIAGNOSIS — M542 Cervicalgia: Secondary | ICD-10-CM | POA: Diagnosis present

## 2015-12-25 DIAGNOSIS — I214 Non-ST elevation (NSTEMI) myocardial infarction: Secondary | ICD-10-CM

## 2015-12-25 DIAGNOSIS — J45909 Unspecified asthma, uncomplicated: Secondary | ICD-10-CM | POA: Diagnosis present

## 2015-12-25 DIAGNOSIS — Z888 Allergy status to other drugs, medicaments and biological substances status: Secondary | ICD-10-CM

## 2015-12-25 DIAGNOSIS — I5032 Chronic diastolic (congestive) heart failure: Secondary | ICD-10-CM | POA: Diagnosis present

## 2015-12-25 DIAGNOSIS — I739 Peripheral vascular disease, unspecified: Secondary | ICD-10-CM | POA: Diagnosis present

## 2015-12-25 DIAGNOSIS — Z7401 Bed confinement status: Secondary | ICD-10-CM

## 2015-12-25 DIAGNOSIS — K3184 Gastroparesis: Secondary | ICD-10-CM | POA: Diagnosis present

## 2015-12-25 DIAGNOSIS — B957 Other staphylococcus as the cause of diseases classified elsewhere: Secondary | ICD-10-CM | POA: Diagnosis present

## 2015-12-25 DIAGNOSIS — Z87891 Personal history of nicotine dependence: Secondary | ICD-10-CM

## 2015-12-25 DIAGNOSIS — D631 Anemia in chronic kidney disease: Secondary | ICD-10-CM | POA: Diagnosis present

## 2015-12-25 DIAGNOSIS — I132 Hypertensive heart and chronic kidney disease with heart failure and with stage 5 chronic kidney disease, or end stage renal disease: Secondary | ICD-10-CM | POA: Diagnosis present

## 2015-12-25 DIAGNOSIS — Z955 Presence of coronary angioplasty implant and graft: Secondary | ICD-10-CM

## 2015-12-25 DIAGNOSIS — Z880 Allergy status to penicillin: Secondary | ICD-10-CM

## 2015-12-25 DIAGNOSIS — Z79899 Other long term (current) drug therapy: Secondary | ICD-10-CM

## 2015-12-25 DIAGNOSIS — K219 Gastro-esophageal reflux disease without esophagitis: Secondary | ICD-10-CM | POA: Diagnosis present

## 2015-12-25 DIAGNOSIS — Z8249 Family history of ischemic heart disease and other diseases of the circulatory system: Secondary | ICD-10-CM

## 2015-12-25 DIAGNOSIS — E1122 Type 2 diabetes mellitus with diabetic chronic kidney disease: Secondary | ICD-10-CM | POA: Diagnosis present

## 2015-12-25 DIAGNOSIS — R509 Fever, unspecified: Secondary | ICD-10-CM

## 2015-12-25 DIAGNOSIS — E871 Hypo-osmolality and hyponatremia: Secondary | ICD-10-CM | POA: Diagnosis present

## 2015-12-25 DIAGNOSIS — A4151 Sepsis due to Escherichia coli [E. coli]: Secondary | ICD-10-CM | POA: Diagnosis not present

## 2015-12-25 DIAGNOSIS — Z89511 Acquired absence of right leg below knee: Secondary | ICD-10-CM

## 2015-12-25 DIAGNOSIS — R7881 Bacteremia: Secondary | ICD-10-CM | POA: Diagnosis present

## 2015-12-25 DIAGNOSIS — Z79891 Long term (current) use of opiate analgesic: Secondary | ICD-10-CM

## 2015-12-25 DIAGNOSIS — E785 Hyperlipidemia, unspecified: Secondary | ICD-10-CM | POA: Diagnosis present

## 2015-12-25 DIAGNOSIS — R079 Chest pain, unspecified: Secondary | ICD-10-CM | POA: Diagnosis present

## 2015-12-25 DIAGNOSIS — Z992 Dependence on renal dialysis: Secondary | ICD-10-CM

## 2015-12-25 DIAGNOSIS — R262 Difficulty in walking, not elsewhere classified: Secondary | ICD-10-CM

## 2015-12-25 DIAGNOSIS — T859XXA Unspecified complication of internal prosthetic device, implant and graft, initial encounter: Secondary | ICD-10-CM

## 2015-12-25 DIAGNOSIS — N186 End stage renal disease: Secondary | ICD-10-CM | POA: Diagnosis present

## 2015-12-25 DIAGNOSIS — E1151 Type 2 diabetes mellitus with diabetic peripheral angiopathy without gangrene: Secondary | ICD-10-CM | POA: Diagnosis present

## 2015-12-25 DIAGNOSIS — A419 Sepsis, unspecified organism: Secondary | ICD-10-CM | POA: Diagnosis not present

## 2015-12-25 DIAGNOSIS — K5900 Constipation, unspecified: Secondary | ICD-10-CM

## 2015-12-25 DIAGNOSIS — Z833 Family history of diabetes mellitus: Secondary | ICD-10-CM

## 2015-12-25 DIAGNOSIS — Z794 Long term (current) use of insulin: Secondary | ICD-10-CM

## 2015-12-25 DIAGNOSIS — Z22322 Carrier or suspected carrier of Methicillin resistant Staphylococcus aureus: Secondary | ICD-10-CM

## 2015-12-25 LAB — COMPREHENSIVE METABOLIC PANEL
ALT: 9 U/L — AB (ref 14–54)
AST: 17 U/L (ref 15–41)
Albumin: 2.2 g/dL — ABNORMAL LOW (ref 3.5–5.0)
Alkaline Phosphatase: 66 U/L (ref 38–126)
Anion gap: 7 (ref 5–15)
BUN: 64 mg/dL — ABNORMAL HIGH (ref 6–20)
CHLORIDE: 96 mmol/L — AB (ref 101–111)
CO2: 28 mmol/L (ref 22–32)
CREATININE: 5.65 mg/dL — AB (ref 0.44–1.00)
Calcium: 9.3 mg/dL (ref 8.9–10.3)
GFR, EST AFRICAN AMERICAN: 9 mL/min — AB (ref >60)
GFR, EST NON AFRICAN AMERICAN: 8 mL/min — AB (ref >60)
Glucose, Bld: 132 mg/dL — ABNORMAL HIGH (ref 65–99)
POTASSIUM: 5 mmol/L (ref 3.5–5.1)
SODIUM: 131 mmol/L — AB (ref 135–145)
Total Bilirubin: 0.5 mg/dL (ref 0.3–1.2)
Total Protein: 5 g/dL — ABNORMAL LOW (ref 6.5–8.1)

## 2015-12-25 LAB — CBC WITH DIFFERENTIAL/PLATELET
BASOS ABS: 0 10*3/uL (ref 0–0.1)
Basophils Relative: 0 %
EOS ABS: 0 10*3/uL (ref 0–0.7)
EOS PCT: 0 %
HCT: 23.5 % — ABNORMAL LOW (ref 35.0–47.0)
Hemoglobin: 7.7 g/dL — ABNORMAL LOW (ref 12.0–16.0)
LYMPHS ABS: 0.6 10*3/uL — AB (ref 1.0–3.6)
LYMPHS PCT: 6 %
MCH: 31.2 pg (ref 26.0–34.0)
MCHC: 32.8 g/dL (ref 32.0–36.0)
MCV: 95.2 fL (ref 80.0–100.0)
Monocytes Absolute: 0.7 10*3/uL (ref 0.2–0.9)
Monocytes Relative: 6 %
NEUTROS PCT: 88 %
Neutro Abs: 9.6 10*3/uL — ABNORMAL HIGH (ref 1.4–6.5)
PLATELETS: 226 10*3/uL (ref 150–440)
RBC: 2.47 MIL/uL — AB (ref 3.80–5.20)
RDW: 14.3 % (ref 11.5–14.5)
WBC: 11 10*3/uL (ref 3.6–11.0)

## 2015-12-25 LAB — GLUCOSE, CAPILLARY: GLUCOSE-CAPILLARY: 133 mg/dL — AB (ref 65–99)

## 2015-12-25 LAB — URINALYSIS COMPLETE WITH MICROSCOPIC (ARMC ONLY)
BILIRUBIN URINE: NEGATIVE
Bacteria, UA: NONE SEEN
GLUCOSE, UA: 150 mg/dL — AB
HGB URINE DIPSTICK: NEGATIVE
KETONES UR: NEGATIVE mg/dL
LEUKOCYTES UA: NEGATIVE
NITRITE: NEGATIVE
PH: 5 (ref 5.0–8.0)
Protein, ur: >500 mg/dL — AB
SPECIFIC GRAVITY, URINE: 1.019 (ref 1.005–1.030)

## 2015-12-25 LAB — APTT: aPTT: 39 seconds — ABNORMAL HIGH (ref 24–36)

## 2015-12-25 LAB — PROTIME-INR
INR: 1.03
Prothrombin Time: 13.5 seconds (ref 11.4–15.2)

## 2015-12-25 LAB — TROPONIN I
TROPONIN I: 0.66 ng/mL — AB (ref <0.03)
TROPONIN I: 1.01 ng/mL — AB (ref <0.03)

## 2015-12-25 LAB — LACTIC ACID, PLASMA: LACTIC ACID, VENOUS: 1.1 mmol/L (ref 0.5–1.9)

## 2015-12-25 MED ORDER — MORPHINE SULFATE (PF) 2 MG/ML IV SOLN
INTRAVENOUS | Status: AC
  Administered 2015-12-25: 2 mg via INTRAVENOUS
  Filled 2015-12-25: qty 1

## 2015-12-25 MED ORDER — INSULIN GLARGINE 100 UNIT/ML ~~LOC~~ SOLN
8.0000 [IU] | Freq: Every day | SUBCUTANEOUS | Status: DC
  Administered 2015-12-25 – 2016-01-02 (×8): 8 [IU] via SUBCUTANEOUS
  Filled 2015-12-25 (×11): qty 0.08

## 2015-12-25 MED ORDER — CALCIUM ACETATE (PHOS BINDER) 667 MG PO CAPS
2001.0000 mg | ORAL_CAPSULE | Freq: Three times a day (TID) | ORAL | Status: DC
  Administered 2015-12-26 – 2016-01-01 (×19): 2001 mg via ORAL
  Filled 2015-12-25 (×19): qty 3

## 2015-12-25 MED ORDER — PANTOPRAZOLE SODIUM 40 MG PO TBEC
40.0000 mg | DELAYED_RELEASE_TABLET | Freq: Every day | ORAL | Status: DC
  Administered 2015-12-26 – 2016-01-04 (×8): 40 mg via ORAL
  Filled 2015-12-25 (×8): qty 1

## 2015-12-25 MED ORDER — BISACODYL 10 MG RE SUPP: 10.0000 mg | Freq: Every day | RECTAL | Status: DC | PRN

## 2015-12-25 MED ORDER — MORPHINE SULFATE (PF) 4 MG/ML IV SOLN
4.0000 mg | Freq: Once | INTRAVENOUS | Status: AC
  Administered 2015-12-25: 4 mg via INTRAVENOUS
  Filled 2015-12-25: qty 1

## 2015-12-25 MED ORDER — METOLAZONE 5 MG PO TABS
5.0000 mg | ORAL_TABLET | Freq: Every day | ORAL | Status: DC | PRN
  Filled 2015-12-25: qty 1

## 2015-12-25 MED ORDER — DEXTROSE 5 % IV SOLN
2.0000 g | Freq: Once | INTRAVENOUS | Status: AC
  Administered 2015-12-25: 2 g via INTRAVENOUS
  Filled 2015-12-25: qty 2

## 2015-12-25 MED ORDER — RISPERIDONE 1 MG PO TABS
1.0000 mg | ORAL_TABLET | Freq: Three times a day (TID) | ORAL | Status: DC
  Administered 2015-12-25 – 2015-12-30 (×14): 1 mg via ORAL
  Filled 2015-12-25 (×14): qty 1

## 2015-12-25 MED ORDER — NITROGLYCERIN 0.4 MG SL SUBL
0.4000 mg | SUBLINGUAL_TABLET | SUBLINGUAL | Status: DC | PRN
  Administered 2015-12-26 (×2): 0.4 mg via SUBLINGUAL
  Filled 2015-12-25: qty 1

## 2015-12-25 MED ORDER — TORSEMIDE 100 MG PO TABS
100.0000 mg | ORAL_TABLET | Freq: Every day | ORAL | Status: DC
  Administered 2015-12-26 – 2016-01-04 (×9): 100 mg via ORAL
  Filled 2015-12-25 (×4): qty 5
  Filled 2015-12-25: qty 1
  Filled 2015-12-25: qty 5
  Filled 2015-12-25 (×2): qty 1
  Filled 2015-12-25: qty 5

## 2015-12-25 MED ORDER — HYDRALAZINE HCL 25 MG PO TABS
25.0000 mg | ORAL_TABLET | Freq: Every day | ORAL | Status: DC
  Administered 2015-12-26 – 2016-01-04 (×9): 25 mg via ORAL
  Filled 2015-12-25 (×9): qty 1

## 2015-12-25 MED ORDER — HEPARIN BOLUS VIA INFUSION
4000.0000 [IU] | Freq: Once | INTRAVENOUS | Status: AC
  Administered 2015-12-25: 4000 [IU] via INTRAVENOUS
  Filled 2015-12-25: qty 4000

## 2015-12-25 MED ORDER — ASPIRIN 81 MG PO CHEW
324.0000 mg | CHEWABLE_TABLET | Freq: Once | ORAL | Status: AC
  Administered 2015-12-25: 324 mg via ORAL
  Filled 2015-12-25: qty 4

## 2015-12-25 MED ORDER — NICOTINE 14 MG/24HR TD PT24
14.0000 mg | MEDICATED_PATCH | Freq: Every day | TRANSDERMAL | Status: DC
  Administered 2015-12-25 – 2016-01-04 (×10): 14 mg via TRANSDERMAL
  Filled 2015-12-25 (×10): qty 1

## 2015-12-25 MED ORDER — HEPARIN (PORCINE) IN NACL 100-0.45 UNIT/ML-% IJ SOLN
1450.0000 [IU]/h | INTRAMUSCULAR | Status: DC
  Administered 2015-12-25: 1200 [IU]/h via INTRAVENOUS
  Administered 2015-12-26: 1450 [IU]/h via INTRAVENOUS
  Filled 2015-12-25 (×3): qty 250

## 2015-12-25 MED ORDER — MORPHINE SULFATE (PF) 2 MG/ML IV SOLN
2.0000 mg | INTRAVENOUS | Status: DC | PRN
  Administered 2015-12-25 – 2015-12-26 (×2): 2 mg via INTRAVENOUS
  Filled 2015-12-25: qty 1

## 2015-12-25 MED ORDER — ACETAMINOPHEN 650 MG RE SUPP: 650.0000 mg | Freq: Four times a day (QID) | RECTAL | Status: DC | PRN

## 2015-12-25 MED ORDER — LEVETIRACETAM 500 MG PO TABS
500.0000 mg | ORAL_TABLET | Freq: Every morning | ORAL | Status: DC
  Administered 2015-12-26 – 2016-01-04 (×9): 500 mg via ORAL
  Filled 2015-12-25 (×9): qty 1

## 2015-12-25 MED ORDER — DOCUSATE SODIUM 100 MG PO CAPS
100.0000 mg | ORAL_CAPSULE | Freq: Every day | ORAL | Status: DC
  Administered 2015-12-26 – 2016-01-03 (×9): 100 mg via ORAL
  Filled 2015-12-25 (×10): qty 1

## 2015-12-25 MED ORDER — ISOSORBIDE MONONITRATE ER 60 MG PO TB24
60.0000 mg | ORAL_TABLET | Freq: Every day | ORAL | Status: DC
  Administered 2015-12-26 – 2016-01-04 (×9): 60 mg via ORAL
  Filled 2015-12-25 (×9): qty 1

## 2015-12-25 MED ORDER — METOPROLOL TARTRATE 50 MG PO TABS
50.0000 mg | ORAL_TABLET | Freq: Two times a day (BID) | ORAL | Status: DC
  Administered 2015-12-25 – 2016-01-04 (×19): 50 mg via ORAL
  Filled 2015-12-25 (×19): qty 1

## 2015-12-25 MED ORDER — WHITE PETROLATUM GEL
Status: DC
  Administered 2015-12-28 – 2015-12-31 (×2): via TOPICAL
  Filled 2015-12-25: qty 5
  Filled 2015-12-25: qty 28.35

## 2015-12-25 MED ORDER — ONDANSETRON HCL 4 MG/2ML IJ SOLN
4.0000 mg | Freq: Once | INTRAMUSCULAR | Status: AC
  Administered 2015-12-25: 4 mg via INTRAVENOUS
  Filled 2015-12-25: qty 2

## 2015-12-25 MED ORDER — RANOLAZINE ER 500 MG PO TB12
500.0000 mg | ORAL_TABLET | Freq: Two times a day (BID) | ORAL | Status: DC
  Administered 2015-12-25 – 2016-01-04 (×19): 500 mg via ORAL
  Filled 2015-12-25 (×20): qty 1

## 2015-12-25 MED ORDER — ONDANSETRON HCL 4 MG PO TABS: 4.0000 mg | ORAL_TABLET | Freq: Four times a day (QID) | ORAL | Status: DC | PRN

## 2015-12-25 MED ORDER — LACTULOSE 10 GM/15ML PO SOLN
10.0000 g | Freq: Every day | ORAL | Status: DC
  Administered 2015-12-25 – 2016-01-04 (×10): 10 g via ORAL
  Filled 2015-12-25 (×10): qty 30

## 2015-12-25 MED ORDER — NYSTATIN 100000 UNIT/GM EX POWD
1.0000 g | Freq: Two times a day (BID) | CUTANEOUS | Status: DC
  Administered 2015-12-26 – 2016-01-04 (×12): 1 g via TOPICAL
  Filled 2015-12-25: qty 15

## 2015-12-25 MED ORDER — INSULIN ASPART 100 UNIT/ML ~~LOC~~ SOLN
0.0000 [IU] | Freq: Three times a day (TID) | SUBCUTANEOUS | Status: DC
  Administered 2015-12-26 – 2015-12-27 (×3): 2 [IU] via SUBCUTANEOUS
  Administered 2015-12-29: 4 [IU] via SUBCUTANEOUS
  Administered 2015-12-29 – 2016-01-02 (×7): 2 [IU] via SUBCUTANEOUS
  Filled 2015-12-25 (×8): qty 2
  Filled 2015-12-25: qty 4
  Filled 2015-12-25 (×2): qty 2

## 2015-12-25 MED ORDER — ONDANSETRON HCL 4 MG/2ML IJ SOLN: 4.0000 mg | Freq: Four times a day (QID) | INTRAMUSCULAR | Status: DC | PRN

## 2015-12-25 MED ORDER — CALCITRIOL 0.25 MCG PO CAPS
0.2500 ug | ORAL_CAPSULE | Freq: Every day | ORAL | Status: DC
  Administered 2015-12-26: 0.25 ug via ORAL
  Filled 2015-12-25: qty 1

## 2015-12-25 MED ORDER — SODIUM CHLORIDE 0.9% FLUSH
3.0000 mL | Freq: Two times a day (BID) | INTRAVENOUS | Status: DC
  Administered 2015-12-26 – 2016-01-04 (×18): 3 mL via INTRAVENOUS

## 2015-12-25 MED ORDER — POLYETHYLENE GLYCOL 3350 17 G PO PACK
17.0000 g | PACK | Freq: Every day | ORAL | Status: DC
  Administered 2015-12-26 – 2016-01-04 (×9): 17 g via ORAL
  Filled 2015-12-25 (×8): qty 1

## 2015-12-25 MED ORDER — ACETAMINOPHEN 325 MG PO TABS
650.0000 mg | ORAL_TABLET | Freq: Four times a day (QID) | ORAL | Status: DC | PRN
  Administered 2015-12-26 – 2015-12-29 (×6): 650 mg via ORAL
  Filled 2015-12-25 (×6): qty 2

## 2015-12-25 MED ORDER — XEROFORM PETROLATUM DRESSING EX PADS: 1.0000 | MEDICATED_PAD | CUTANEOUS | Status: DC

## 2015-12-25 MED ORDER — SENNA 8.6 MG PO TABS
2.0000 | ORAL_TABLET | Freq: Two times a day (BID) | ORAL | Status: DC
  Administered 2015-12-25 – 2016-01-04 (×19): 17.2 mg via ORAL
  Filled 2015-12-25 (×19): qty 2

## 2015-12-25 MED ORDER — CALCIUM CARBONATE ANTACID 500 MG PO CHEW
1.0000 | CHEWABLE_TABLET | Freq: Three times a day (TID) | ORAL | Status: DC
  Administered 2015-12-26 – 2016-01-04 (×26): 200 mg via ORAL
  Filled 2015-12-25 (×27): qty 1

## 2015-12-25 MED ORDER — COLESEVELAM HCL 625 MG PO TABS
625.0000 mg | ORAL_TABLET | Freq: Two times a day (BID) | ORAL | Status: DC
  Administered 2015-12-26 – 2016-01-04 (×18): 625 mg via ORAL
  Filled 2015-12-25 (×21): qty 1

## 2015-12-25 MED ORDER — MUPIROCIN 2 % EX OINT
1.0000 "application " | TOPICAL_OINTMENT | Freq: Two times a day (BID) | CUTANEOUS | Status: DC
  Administered 2015-12-25 – 2016-01-04 (×18): 1 via NASAL
  Filled 2015-12-25 (×2): qty 22

## 2015-12-25 MED ORDER — LISINOPRIL 20 MG PO TABS
40.0000 mg | ORAL_TABLET | Freq: Every day | ORAL | Status: DC
Start: 2015-12-26 — End: 2016-01-04
  Administered 2015-12-26 – 2016-01-04 (×9): 40 mg via ORAL
  Filled 2015-12-25 (×9): qty 2

## 2015-12-25 MED ORDER — PRO-STAT SUGAR FREE PO LIQD
30.0000 mL | Freq: Three times a day (TID) | ORAL | Status: DC
  Administered 2015-12-26 – 2016-01-03 (×9): 30 mL via ORAL

## 2015-12-25 MED ORDER — VANCOMYCIN HCL IN DEXTROSE 1-5 GM/200ML-% IV SOLN
1000.0000 mg | Freq: Once | INTRAVENOUS | Status: AC
  Administered 2015-12-25: 1000 mg via INTRAVENOUS
  Filled 2015-12-25: qty 200

## 2015-12-25 MED ORDER — CLOPIDOGREL BISULFATE 75 MG PO TABS
75.0000 mg | ORAL_TABLET | Freq: Every day | ORAL | Status: DC
  Administered 2015-12-26 – 2016-01-04 (×9): 75 mg via ORAL
  Filled 2015-12-25 (×9): qty 1

## 2015-12-25 NOTE — ED Notes (Signed)
Dialysis team finished.  Sample obtain and dressing changed

## 2015-12-25 NOTE — Progress Notes (Signed)
ANTICOAGULATION CONSULT NOTE - Initial Consult  Pharmacy Consult for heparin drip Indication: chest pain/ACS  Allergies  Allergen Reactions  . Cephalosporins Anaphylaxis  . Penicillins Anaphylaxis and Other (See Comments)    Has patient had a PCN reaction causing immediate rash, facial/tongue/throat swelling, SOB or lightheadedness with hypotension: Yes Has patient had a PCN reaction causing severe rash involving mucus membranes or skin necrosis: No Has patient had a PCN reaction that required hospitalization No Has patient had a PCN reaction occurring within the last 10 years: No If all of the above answers are "NO", then may proceed with Cephalosporin use.  . Lamictal [Lamotrigine] Other (See Comments)    Reaction:  Hallucinations  . Phenergan [Promethazine Hcl] Nausea And Vomiting  . Pravastatin Other (See Comments)    Reaction:  Muscle pain   . Sulfa Antibiotics Other (See Comments)    Reaction:  Unknown     Patient Measurements: Height: 5\' 10"  (177.8 cm) Weight: 263 lb (119.3 kg) IBW/kg (Calculated) : 68.5 Heparin Dosing Weight: 96 kg  Vital Signs: Temp: 100.1 F (37.8 C) (09/25 1426) Temp Source: Oral (09/25 1426) BP: 119/53 (09/25 1500) Pulse Rate: 78 (09/25 1500)  Labs:  Recent Labs  12/24/15 0816 12/24/15 1206 12/25/15 1434  HGB 9.6*  --  7.7*  HCT 28.1*  --  23.5*  PLT 299  --  226  CREATININE 4.51*  --  5.65*  TROPONINI 0.04* 0.03* 0.66*    Estimated Creatinine Clearance: 16.3 mL/min (by C-G formula based on SCr of 5.65 mg/dL (H)).   Medical History: Past Medical History:  Diagnosis Date  . Asthma   . CAD (coronary artery disease)   . CHF (congestive heart failure) (HCC)   . Diabetes mellitus without complication (HCC)   . Endometriosis   . ESRD (end stage renal disease) (HCC)   . Gastroparesis   . GERD (gastroesophageal reflux disease)   . HLD (hyperlipidemia)   . Hypertension   . Renal disorder     Assessment: Pharmacy consulted to  dose and monitor heparin in this 50 year old female for ACS. Patient denies taking any anticoagulants prior to admission. Baseline labs have been ordered.  Goal of Therapy:  Heparin level 0.3-0.7 units/ml Monitor platelets by anticoagulation protocol: Yes   Plan:  Give 4000 units bolus x 1 Start heparin infusion at 1200 units/hr Check anti-Xa level in 8 hours and daily while on heparin Continue to monitor H&H and platelets  Cindi Carbon, PharmD, BCPS Clinical Pharmacist 12/25/2015,4:21 PM

## 2015-12-25 NOTE — ED Provider Notes (Signed)
[facility] Emergency Department Provider Note   ____________________________________________   First MD Initiated Contact with Patient 12/25/15 1429     (approximate)  I have reviewed the triage vital signs and the nursing notes.   HISTORY  Chief Complaint Chest Pain    HPI Kimberly Montgomery is a 50 y.o. female history of coronary artery disease with stents, CHF, end-stage renal disease on dialysis typically dialyzes on Tuesday Thursday and Saturday, diabetes, hypertension who presents for evaluation of chest pain today, gradual onset, constant, moderate, no modifying factors. Patient is not able to give much more history just stating that her chest hurts in the right chest, does not radiate it is associated with some mild shortness of breath. According to EMS she was also febrile to 102.8 on their arrival. Patient was seen in this emergency department last night for chest pain, had 2 negative troponins and was supposed to follow-up with Dr. Welton Flakes in clinic however was not seen in clinic today. According to EMS, she received 3 sublingual nitroglycerin sprays and aspirin without any improvement of her symptoms.   Past Medical History:  Diagnosis Date  . Asthma   . CAD (coronary artery disease)   . CHF (congestive heart failure) (HCC)   . Diabetes mellitus without complication (HCC)   . Endometriosis   . ESRD (end stage renal disease) (HCC)   . Gastroparesis   . GERD (gastroesophageal reflux disease)   . HLD (hyperlipidemia)   . Hypertension   . Renal disorder     Patient Active Problem List   Diagnosis Date Noted  . Essential hypertension 11/21/2015  . Hyperlipidemia 11/21/2015  . Anemia 11/21/2015  . Acute respiratory failure with hypoxia (HCC) 11/21/2015  . Acute diastolic CHF (congestive heart failure) (HCC) 11/21/2015  . ESRD on dialysis (HCC) 11/21/2015  . 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 NSTEMI (non-ST elevated myocardial infarction) (HCC)  11/20/2015  . Chest pain, rule out acute myocardial infarction 11/18/2015  . Bipolar I disorder, most recent episode depressed (HCC)   . Altered mental status 08/24/2015  . ESRD (end stage renal disease) (HCC)   . Ileus (HCC)   . Bipolar I disorder (HCC) 07/25/2015  . Seizures (HCC) 07/25/2015  . Peritonitis (HCC) 07/16/2015  . Unstable angina (HCC) 07/09/2015  . ESRD on peritoneal dialysis (HCC) 07/09/2015  . Accelerated hypertension 07/09/2015  . Type 2 diabetes mellitus (HCC) 07/09/2015  . CAD (coronary artery disease) 07/09/2015  . HLD (hyperlipidemia) 07/09/2015  . GERD (gastroesophageal reflux disease) 07/09/2015  . Chest pain 05/12/2015    Past Surgical History:  Procedure Laterality Date  . BELOW KNEE LEG AMPUTATION    . CARDIAC CATHETERIZATION Right 07/10/2015   Procedure: Left Heart Cath and Coronary Angiography;  Surgeon: Laurier Nancy, MD;  Location: [facility] INVASIVE CV LAB;  Service: Cardiovascular;  Laterality: Right;  . CARDIAC CATHETERIZATION Right 09/14/2015   Procedure: Left Heart Cath and Coronary Angiography;  Surgeon: Laurier Nancy, MD;  Location: [facility] INVASIVE CV LAB;  Service: Cardiovascular;  Laterality: Right;  . CARDIAC CATHETERIZATION N/A 09/14/2015   Procedure: Coronary Stent Intervention;  Surgeon: Alwyn Pea, MD;  Location: [facility] INVASIVE CV LAB;  Service: Cardiovascular;  Laterality: N/A;  . CARDIAC CATHETERIZATION Right 11/20/2015   Procedure: Left Heart Cath and Coronary Angiography;  Surgeon: Laurier Nancy, MD;  Location: [facility] INVASIVE CV LAB;  Service: Cardiovascular;  Laterality: Right;  . hd fistula surgery with reversal    . HYSTEROTOMY    .  pd tube inserstion    . PERIPHERAL VASCULAR CATHETERIZATION N/A 08/30/2015   Procedure: Dialysis/Perma Catheter Insertion;  Surgeon: Annice NeedyJason S Dew, MD;  Location: [facility] INVASIVE CV LAB;  Service: Cardiovascular;  Laterality: N/A;    Prior to Admission medications   Medication Sig Start Date End Date  Taking? Authorizing Provider  Amino Acids-Protein Hydrolys (FEEDING SUPPLEMENT, PRO-STAT SUGAR FREE 64,) LIQD Take 30 mLs by mouth 3 (three) times daily with meals.    Historical Provider, MD  bisacodyl (DULCOLAX) 10 MG suppository Place 10 mg rectally daily as needed for moderate constipation. Use if no relief from Milk of Magnesia    Historical Provider, MD  Bismuth Tribromoph-Petrolatum (XEROFORM PETROLATUM DRESSING) PADS Apply 1 each topically every 3 (three) days.    Historical Provider, MD  calcitRIOL (ROCALTROL) 0.25 MCG capsule Take 0.25 mcg by mouth daily.    Historical Provider, MD  calcium acetate (PHOSLO) 667 MG capsule Take 3 capsules (2,001 mg total) by mouth 3 (three) times daily with meals. 07/22/15   Enedina FinnerSona Patel, MD  calcium carbonate (TUMS - DOSED IN MG ELEMENTAL CALCIUM) 500 MG chewable tablet Chew 1 tablet (200 mg of elemental calcium total) by mouth 3 (three) times daily with meals. 08/02/15   Adrian SaranSital Mody, MD  clopidogrel (PLAVIX) 75 MG tablet Take 75 mg by mouth daily.    Historical Provider, MD  colesevelam (WELCHOL) 625 MG tablet Take 1 tablet (625 mg total) by mouth 2 (two) times daily with a meal. 11/21/15   Katharina Caperima Vaickute, MD  docusate sodium (COLACE) 100 MG capsule Take 100 mg by mouth at bedtime.     Historical Provider, MD  erythromycin (E-MYCIN) 250 MG tablet Take 250 mg by mouth 4 (four) times daily.    Historical Provider, MD  hydrALAZINE (APRESOLINE) 25 MG tablet Take 25 mg by mouth daily.     Historical Provider, MD  insulin aspart (NOVOLOG) 100 UNIT/ML injection Inject 0-10 Units into the skin 3 (three) times daily with meals. 0-150=0units 151-200=2units 201-250=4units 251-300=6units 301-350=8units 351-400=10 units    Historical Provider, MD  insulin glargine (LANTUS) 100 UNIT/ML injection Inject 0.08 mLs (8 Units total) into the skin at bedtime. 08/30/15   Milagros LollSrikar Sudini, MD  isosorbide mononitrate (IMDUR) 60 MG 24 hr tablet Take 1 tablet (60 mg total) by mouth daily.  07/11/15   Auburn BilberryShreyang Patel, MD  lactulose (CHRONULAC) 10 GM/15ML solution Take 10 g by mouth daily as needed for mild constipation.    Historical Provider, MD  levETIRAcetam (KEPPRA) 500 MG tablet Take 1 tablet (500 mg total) by mouth every morning. 08/02/15   Adrian SaranSital Mody, MD  lisinopril (PRINIVIL,ZESTRIL) 40 MG tablet Take 40 mg by mouth daily. 06/26/15   Historical Provider, MD  metoCLOPramide (REGLAN) 10 MG tablet Take 1 tablet (10 mg total) by mouth 4 (four) times daily. 08/02/15   Adrian SaranSital Mody, MD  metolazone (ZAROXOLYN) 5 MG tablet Take 5 mg by mouth daily as needed (fluid).    Historical Provider, MD  metoprolol (LOPRESSOR) 50 MG tablet Take 1 tablet (50 mg total) by mouth 2 (two) times daily. 09/15/15   Milagros LollSrikar Sudini, MD  mupirocin ointment (BACTROBAN) 2 % Place 1 application into the nose 2 (two) times daily. 11/21/15   Katharina Caperima Vaickute, MD  nicotine (NICODERM CQ - DOSED IN MG/24 HOURS) 14 mg/24hr patch Place 14 mg onto the skin daily.    Historical Provider, MD  nitroGLYCERIN (NITROSTAT) 0.4 MG SL tablet Place 0.4 mg under the tongue every 5 (five) minutes as  needed for chest pain. Reported on 07/10/2015    Historical Provider, MD  nystatin (MYCOSTATIN) powder Apply 1 g topically 2 (two) times daily.    Historical Provider, MD  omeprazole (PRILOSEC) 20 MG capsule Take 40 mg by mouth 2 (two) times daily before a meal.    Historical Provider, MD  ondansetron (ZOFRAN ODT) 4 MG disintegrating tablet Take 1 tablet (4 mg total) by mouth every 6 (six) hours as needed for nausea or vomiting. 07/16/15   Sharyn Creamer, MD  oxyCODONE (OXY IR/ROXICODONE) 5 MG immediate release tablet Take 1 tablet (5 mg total) by mouth every 6 (six) hours as needed for severe pain. 09/15/15   Srikar Sudini, MD  polyethylene glycol (MIRALAX / GLYCOLAX) packet Take 17 g by mouth daily. 08/02/15   Adrian Saran, MD  ranolazine (RANEXA) 500 MG 12 hr tablet Take 1 tablet (500 mg total) by mouth 2 (two) times daily. 07/11/15   Auburn Bilberry, MD    risperiDONE (RISPERDAL) 1 MG tablet Take 1 mg by mouth 3 (three) times daily.     Historical Provider, MD  senna (SENOKOT) 8.6 MG TABS tablet Take 2 tablets by mouth 2 (two) times daily.     Historical Provider, MD  torsemide (DEMADEX) 100 MG tablet Take 100 mg by mouth daily.    Historical Provider, MD    Allergies Cephalosporins; Penicillins; Lamictal [lamotrigine]; Phenergan [promethazine hcl]; Pravastatin; and Sulfa antibiotics  Family History  Problem Relation Age of Onset  . CAD    . Diabetes    . Bipolar disorder    . Cervical cancer Mother     Social History Social History  Substance Use Topics  . Smoking status: Former Smoker    Packs/day: 0.50    Types: Cigarettes  . Smokeless tobacco: Never Used  . Alcohol use No    Review of Systems Constitutional: + fever/chills Eyes: No visual changes. ENT: No sore throat. Cardiovascular: + chest pain. Respiratory: + shortness of breath. Gastrointestinal: No abdominal pain.  No nausea, no vomiting.  No diarrhea.  No constipation. Genitourinary: Negative for dysuria. Musculoskeletal: Negative for back pain. Skin: Negative for rash. Neurological: Negative for headaches, focal weakness or numbness.  10-point ROS otherwise negative.  ____________________________________________   PHYSICAL EXAM:  VITAL SIGNS: ED Triage Vitals  Enc Vitals Group     BP 12/25/15 1426 138/60     Pulse Rate 12/25/15 1426 84     Resp 12/25/15 1426 18     Temp 12/25/15 1426 100.1 F (37.8 C)     Temp Source 12/25/15 1426 Oral     SpO2 12/25/15 1423 97 %     Weight 12/25/15 1427 263 lb (119.3 kg)     Height 12/25/15 1427 5\' 10"  (1.778 m)     Head Circumference --      Peak Flow --      Pain Score 12/25/15 1428 4     Pain Loc --      Pain Edu? --      Excl. in GC? --     Constitutional: Alert and oriented. Appears fatigued and ill but and in no acute distress. Eyes: Conjunctivae are normal. PERRL. EOMI. Head: Atraumatic. Nose: No  congestion/rhinnorhea. Mouth/Throat: Mucous membranes are moist.  Oropharynx non-erythematous. Neck: No stridor. Supple without meningismus. Cardiovascular: Normal rate, regular rhythm. Grossly normal heart sounds.  Good peripheral circulation. Respiratory: Normal respiratory effort.  No retractions. Lungs CTAB. Gastrointestinal: Soft and nontender. No distention. Peritoneal dialysis catheter in the right abdomen  without drainage, erythema, fluctuance or warmth. No CVA tenderness. Genitourinary: Deferred Rectal: brown stool in the rectal vault is guaiac negative, no blood Musculoskeletal: No lower extremity tenderness nor edema.  No joint effusions. Hemodialysis catheter in the right chest wall without surrounding erythema or drainage. Neurologic:  Normal speech and language. No gross focal neurologic deficits are appreciated.  Skin:  Skin is warm, dry and intact. No rash noted. Psychiatric: Mood and affect are normal. Speech and behavior are normal.  ____________________________________________   LABS (all labs ordered are listed, but only abnormal results are displayed)  Labs Reviewed  CBC WITH DIFFERENTIAL/PLATELET - Abnormal; Notable for the following:       Result Value   RBC 2.47 (*)    Hemoglobin 7.7 (*)    HCT 23.5 (*)    Neutro Abs 9.6 (*)    Lymphs Abs 0.6 (*)    All other components within normal limits  COMPREHENSIVE METABOLIC PANEL - Abnormal; Notable for the following:    Sodium 131 (*)    Chloride 96 (*)    Glucose, Bld 132 (*)    BUN 64 (*)    Creatinine, Ser 5.65 (*)    Total Protein 5.0 (*)    Albumin 2.2 (*)    ALT 9 (*)    GFR calc non Af Amer 8 (*)    GFR calc Af Amer 9 (*)    All other components within normal limits  TROPONIN I - Abnormal; Notable for the following:    Troponin I 0.66 (*)    All other components within normal limits  URINALYSIS COMPLETEWITH MICROSCOPIC ([facility] ONLY) - Abnormal; Notable for the following:    Color, Urine YELLOW (*)     APPearance HAZY (*)    Glucose, UA 150 (*)    Protein, ur >500 (*)    Squamous Epithelial / LPF 0-5 (*)    All other components within normal limits  APTT - Abnormal; Notable for the following:    aPTT 39 (*)    All other components within normal limits  CULTURE, BLOOD (ROUTINE X 2)  CULTURE, BLOOD (ROUTINE X 2)  BODY FLUID CULTURE  LACTIC ACID, PLASMA  PROTIME-INR  LACTIC ACID, PLASMA  HEPARIN LEVEL (UNFRACTIONATED)   ____________________________________________  EKG  ED ECG REPORT I, Gayla Doss, the attending physician, personally viewed and interpreted this ECG.   Date: 12/25/2015  EKG Time: 14:33  Rate: 83  Rhythm: normal sinus rhythm  Axis: normal  Intervals:none  ST&T Change: No acute ST elevation MI. Minimal ST depression in the inferior leads.  ____________________________________________  RADIOLOGY  CXR IMPRESSION:  No active cardiopulmonary disease.    ____________________________________________   PROCEDURES  Procedure(s) performed: None  Procedures  Critical Care performed:   CRITICAL CARE Performed by: Toney Rakes A   Total critical care time: 35 minutes  Critical care time was exclusive of separately billable procedures and treating other patients.  Critical care was necessary to treat or prevent imminent or life-threatening deterioration.  Critical care was time spent personally by me on the following activities: development of treatment plan with patient and/or surrogate as well as nursing, discussions with consultants, evaluation of patient's response to treatment, examination of patient, obtaining history from patient or surrogate, ordering and performing treatments and interventions, ordering and review of laboratory studies, ordering and review of radiographic studies, pulse oximetry and re-evaluation of patient's condition.  ____________________________________________   INITIAL IMPRESSION / ASSESSMENT AND PLAN / ED  COURSE  Pertinent labs &  imaging results that were available during my care of the patient were reviewed by me and considered in my medical decision making (see chart for details).  Kimberly Montgomery is a 50 y.o. female history of coronary artery disease with stents, CHF, end-stage renal disease on dialysis typically dialyzes on Tuesday Thursday and Saturday, diabetes, hypertension who presents for evaluation of chest pain today. On arrival to the emergency department she is fatigued appearing and appears ill but is in no acute distress. Bergeron arrival was 100.1, otherwise the remainder of her vital signs are stable and she is afebrile. I discussed her EKG with Dr. Welton Flakes who has also reviewed the EKG and reports no STEMI, minimal ST depression in the inferior leads, recommends treatment for likely cardiac cause of chest pain with morphine, aspirin, heparin drip. We'll chairs symptomatically, obtain blood cultures, venous lactic acid, treatment with vancomycin and aztreonam empirically for possibly bacteremia given her fever. We'll obtain chest x-ray as well as urinalysis. I discussed the case with Dr. Cherylann Ratel of nephrology as well as the dialysis nurse and they will collect fluid from the peritoneal dialysis catheter so that the patient can also be evaluated for peritonitis as a cause of her fever.  ----------------------------------------- 4:55 PM on 12/25/2015 ----------------------------------------- Troponin elevated 0.66, rectal exam shows brown stool, no blood, guaiac negative, we'll start a continuous heparin infusion. CB shows anemia with a hemoglobin of 7.7 however no evidence of active bleed, denies history of GI bleed, she is hemodynamically stable. CMP with a creatinine elevation as expected in the setting of end-stage renal disease. Lactic acid is reassuring at 1.1. Urinalysis is not consistent with infection.  Clinical Course     ____________________________________________   FINAL  CLINICAL IMPRESSION(S) / ED DIAGNOSES  Final diagnoses:  NSTEMI (non-ST elevated myocardial infarction) (HCC)  Fever, unspecified fever cause  Chest pain, unspecified chest pain type      NEW MEDICATIONS STARTED DURING THIS VISIT:  New Prescriptions   No medications on file     Note:  This document was prepared using Dragon voice recognition software and may include unintentional dictation errors.    Gayla Doss, MD 12/25/15 [id number]

## 2015-12-25 NOTE — Progress Notes (Signed)
Performed CAPD manual fill, dwell and empty on patient. PD fluid sample and dressing change.  Pt. Alert, vss, no c/o. Primary RN present.

## 2015-12-25 NOTE — H&P (Signed)
[employer] - [place] at [facility]   PATIENT NAME: Kimberly Montgomery    MR#:  [id number]  DATE OF BIRTH:  [birth year]  DATE OF ADMISSION:  12/25/2015  PRIMARY CARE PHYSICIAN: Jarome Matin, MD   REQUESTING/REFERRING PHYSICIAN: Dr. Chari Manning  CHIEF COMPLAINT:   Chief Complaint  Patient presents with  . Chest Pain    HISTORY OF PRESENT ILLNESS:  [name]  is a 50 y.o. female with a known history of End-stage renal disease on hemodialysis, history of coronary disease, CHF, diabetes, peripheral asked disease, hyperlipidemia, hypertension who presented to the hospital due to chest pain. Patient is not the best historian. She says that she is been having some chest pain now for the past couple days. It is located in the center of her chest radiating to her neck. She has no associated nausea vomiting diaphoresis palpitations or syncope. She was sent to the ER for further evaluation by the skilled nursing facility where she resides and she was noted to have a mildly elevated troponin of 0.66. The ER physician spoke to the patient's cardiologist and they recommended admission overnight.   PAST MEDICAL HISTORY:   Past Medical History:  Diagnosis Date  . Asthma   . CAD (coronary artery disease)   . CHF (congestive heart failure) (HCC)   . Diabetes mellitus without complication (HCC)   . Endometriosis   . ESRD (end stage renal disease) (HCC)   . Gastroparesis   . GERD (gastroesophageal reflux disease)   . HLD (hyperlipidemia)   . Hypertension   . Renal disorder     PAST SURGICAL HISTORY:   Past Surgical History:  Procedure Laterality Date  . BELOW KNEE LEG AMPUTATION    . CARDIAC CATHETERIZATION Right 07/10/2015   Procedure: Left Heart Cath and Coronary Angiography;  Surgeon: Laurier Nancy, MD;  Location: [facility] INVASIVE CV LAB;  Service: Cardiovascular;  Laterality: Right;  . CARDIAC CATHETERIZATION Right 09/14/2015   Procedure: Left Heart Cath and Coronary  Angiography;  Surgeon: Laurier Nancy, MD;  Location: [facility] INVASIVE CV LAB;  Service: Cardiovascular;  Laterality: Right;  . CARDIAC CATHETERIZATION N/A 09/14/2015   Procedure: Coronary Stent Intervention;  Surgeon: Alwyn Pea, MD;  Location: [facility] INVASIVE CV LAB;  Service: Cardiovascular;  Laterality: N/A;  . CARDIAC CATHETERIZATION Right 11/20/2015   Procedure: Left Heart Cath and Coronary Angiography;  Surgeon: Laurier Nancy, MD;  Location: [facility] INVASIVE CV LAB;  Service: Cardiovascular;  Laterality: Right;  . hd fistula surgery with reversal    . HYSTEROTOMY    . pd tube inserstion    . PERIPHERAL VASCULAR CATHETERIZATION N/A 08/30/2015   Procedure: Dialysis/Perma Catheter Insertion;  Surgeon: Annice Needy, MD;  Location: [facility] INVASIVE CV LAB;  Service: Cardiovascular;  Laterality: N/A;    SOCIAL HISTORY:   Social History  Substance Use Topics  . Smoking status: Former Smoker    Packs/day: 0.50    Types: Cigarettes  . Smokeless tobacco: Never Used  . Alcohol use No    FAMILY HISTORY:   Family History  Problem Relation Age of Onset  . CAD    . Diabetes    . Bipolar disorder    . Cervical cancer Mother     DRUG ALLERGIES:   Allergies  Allergen Reactions  . Cephalosporins Anaphylaxis  . Penicillins Anaphylaxis and Other (See Comments)    Has patient had a PCN reaction causing immediate rash, facial/tongue/throat swelling, SOB or lightheadedness with hypotension: Yes Has patient  had a PCN reaction causing severe rash involving mucus membranes or skin necrosis: No Has patient had a PCN reaction that required hospitalization No Has patient had a PCN reaction occurring within the last 10 years: No If all of the above answers are "NO", then may proceed with Cephalosporin use.  . Lamictal [Lamotrigine] Other (See Comments)    Reaction:  Hallucinations  . Phenergan [Promethazine Hcl] Nausea And Vomiting  . Pravastatin Other (See Comments)    Reaction:  Muscle pain   .  Sulfa Antibiotics Other (See Comments)    Reaction:  Unknown     REVIEW OF SYSTEMS:   Review of Systems  Constitutional: Negative for fever and weight loss.  HENT: Negative for congestion, nosebleeds and tinnitus.   Eyes: Negative for blurred vision, double vision and redness.  Respiratory: Negative for cough, hemoptysis and shortness of breath.   Cardiovascular: Positive for chest pain. Negative for orthopnea, leg swelling and PND.  Gastrointestinal: Negative for abdominal pain, diarrhea, melena, nausea and vomiting.  Genitourinary: Negative for dysuria, hematuria and urgency.  Musculoskeletal: Negative for falls and joint pain.  Neurological: Negative for dizziness, tingling, sensory change, focal weakness, seizures, weakness and headaches.  Endo/Heme/Allergies: Negative for polydipsia. Does not bruise/bleed easily.  Psychiatric/Behavioral: Negative for depression and memory loss. The patient is not nervous/anxious.     MEDICATIONS AT HOME:   Prior to Admission medications   Medication Sig Start Date End Date Taking? Authorizing Provider  Amino Acids-Protein Hydrolys (FEEDING SUPPLEMENT, PRO-STAT SUGAR FREE 64,) LIQD Take 30 mLs by mouth 3 (three) times daily with meals.    Historical Provider, MD  bisacodyl (DULCOLAX) 10 MG suppository Place 10 mg rectally daily as needed for moderate constipation. Use if no relief from Milk of Magnesia    Historical Provider, MD  Bismuth Tribromoph-Petrolatum (XEROFORM PETROLATUM DRESSING) PADS Apply 1 each topically every 3 (three) days.    Historical Provider, MD  calcitRIOL (ROCALTROL) 0.25 MCG capsule Take 0.25 mcg by mouth daily.    Historical Provider, MD  calcium acetate (PHOSLO) 667 MG capsule Take 3 capsules (2,001 mg total) by mouth 3 (three) times daily with meals. 07/22/15   Enedina Finner, MD  calcium carbonate (TUMS - DOSED IN MG ELEMENTAL CALCIUM) 500 MG chewable tablet Chew 1 tablet (200 mg of elemental calcium total) by mouth 3 (three)  times daily with meals. 08/02/15   Adrian Saran, MD  clopidogrel (PLAVIX) 75 MG tablet Take 75 mg by mouth daily.    Historical Provider, MD  colesevelam (WELCHOL) 625 MG tablet Take 1 tablet (625 mg total) by mouth 2 (two) times daily with a meal. 11/21/15   Katharina Caper, MD  docusate sodium (COLACE) 100 MG capsule Take 100 mg by mouth at bedtime.     Historical Provider, MD  erythromycin (E-MYCIN) 250 MG tablet Take 250 mg by mouth 4 (four) times daily.    Historical Provider, MD  hydrALAZINE (APRESOLINE) 25 MG tablet Take 25 mg by mouth daily.     Historical Provider, MD  insulin aspart (NOVOLOG) 100 UNIT/ML injection Inject 0-10 Units into the skin 3 (three) times daily with meals. 0-150=0units 151-200=2units 201-250=4units 251-300=6units 301-350=8units 351-400=10 units    Historical Provider, MD  insulin glargine (LANTUS) 100 UNIT/ML injection Inject 0.08 mLs (8 Units total) into the skin at bedtime. 08/30/15   Milagros Loll, MD  isosorbide mononitrate (IMDUR) 60 MG 24 hr tablet Take 1 tablet (60 mg total) by mouth daily. 07/11/15   Auburn Bilberry, MD  lactulose (  CHRONULAC) 10 GM/15ML solution Take 10 g by mouth daily as needed for mild constipation.    Historical Provider, MD  levETIRAcetam (KEPPRA) 500 MG tablet Take 1 tablet (500 mg total) by mouth every morning. 08/02/15   Adrian SaranSital Mody, MD  lisinopril (PRINIVIL,ZESTRIL) 40 MG tablet Take 40 mg by mouth daily. 06/26/15   Historical Provider, MD  metoCLOPramide (REGLAN) 10 MG tablet Take 1 tablet (10 mg total) by mouth 4 (four) times daily. 08/02/15   Adrian SaranSital Mody, MD  metolazone (ZAROXOLYN) 5 MG tablet Take 5 mg by mouth daily as needed (fluid).    Historical Provider, MD  metoprolol (LOPRESSOR) 50 MG tablet Take 1 tablet (50 mg total) by mouth 2 (two) times daily. 09/15/15   Milagros LollSrikar Sudini, MD  mupirocin ointment (BACTROBAN) 2 % Place 1 application into the nose 2 (two) times daily. 11/21/15   Katharina Caperima Vaickute, MD  nicotine (NICODERM CQ - DOSED IN MG/24  HOURS) 14 mg/24hr patch Place 14 mg onto the skin daily.    Historical Provider, MD  nitroGLYCERIN (NITROSTAT) 0.4 MG SL tablet Place 0.4 mg under the tongue every 5 (five) minutes as needed for chest pain. Reported on 07/10/2015    Historical Provider, MD  nystatin (MYCOSTATIN) powder Apply 1 g topically 2 (two) times daily.    Historical Provider, MD  omeprazole (PRILOSEC) 20 MG capsule Take 40 mg by mouth 2 (two) times daily before a meal.    Historical Provider, MD  ondansetron (ZOFRAN ODT) 4 MG disintegrating tablet Take 1 tablet (4 mg total) by mouth every 6 (six) hours as needed for nausea or vomiting. 07/16/15   Sharyn CreamerMark Quale, MD  oxyCODONE (OXY IR/ROXICODONE) 5 MG immediate release tablet Take 1 tablet (5 mg total) by mouth every 6 (six) hours as needed for severe pain. 09/15/15   Srikar Sudini, MD  polyethylene glycol (MIRALAX / GLYCOLAX) packet Take 17 g by mouth daily. 08/02/15   Adrian SaranSital Mody, MD  ranolazine (RANEXA) 500 MG 12 hr tablet Take 1 tablet (500 mg total) by mouth 2 (two) times daily. 07/11/15   Auburn BilberryShreyang Patel, MD  risperiDONE (RISPERDAL) 1 MG tablet Take 1 mg by mouth 3 (three) times daily.     Historical Provider, MD  senna (SENOKOT) 8.6 MG TABS tablet Take 2 tablets by mouth 2 (two) times daily.     Historical Provider, MD  torsemide (DEMADEX) 100 MG tablet Take 100 mg by mouth daily.    Historical Provider, MD      VITAL SIGNS:  Blood pressure (!) 120/51, pulse 76, temperature 100.1 F (37.8 C), temperature source Oral, resp. rate 19, height 5\' 10"  (1.778 m), weight 119.3 kg (263 lb), last menstrual period 03/16/2015, SpO2 100 %.  PHYSICAL EXAMINATION:  Physical Exam  GENERAL:  50 y.o.-year-old patient lying in the bed in no acute distress.  EYES: Pupils equal, round, reactive to light and accommodation. No scleral icterus. Extraocular muscles intact.  HEENT: Head atraumatic, normocephalic. Oropharynx and nasopharynx clear. No oropharyngeal erythema, moist oral mucosa  NECK:   Supple, no jugular venous distention. No thyroid enlargement, no tenderness.  LUNGS: Normal breath sounds bilaterally, no wheezing, rales, rhonchi. No use of accessory muscles of respiration.  CARDIOVASCULAR: S1, S2 RRR. No murmurs, rubs, gallops, clicks.  ABDOMEN: Soft, nontender, nondistended. Bowel sounds present. No organomegaly or mass. Positive peritoneal dialysis catheter in place without acute drainage. EXTREMITIES: No pedal edema, cyanosis, or clubbing. + 2 pedal & radial pulses b/l.   NEUROLOGIC: Cranial nerves II through XII  are intact. No focal Motor or sensory deficits appreciated b/l.  Globally weak.   PSYCHIATRIC: The patient is alert and oriented x 2.  SKIN: No obvious rash, lesion, or ulcer.   Right chest wall dialysis catheter in place.  LABORATORY PANEL:   CBC  Recent Labs Lab 12/25/15 1434  WBC 11.0  HGB 7.7*  HCT 23.5*  PLT 226   ------------------------------------------------------------------------------------------------------------------  Chemistries   Recent Labs Lab 12/25/15 1434  NA 131*  K 5.0  CL 96*  CO2 28  GLUCOSE 132*  BUN 64*  CREATININE 5.65*  CALCIUM 9.3  AST 17  ALT 9*  ALKPHOS 66  BILITOT 0.5   ------------------------------------------------------------------------------------------------------------------  Cardiac Enzymes  Recent Labs Lab 12/25/15 1434  TROPONINI 0.66*   ------------------------------------------------------------------------------------------------------------------  RADIOLOGY:  Dg Chest 2 View  Result Date: 12/24/2015 CLINICAL DATA:  Chest pain this morning. Previous coronary artery stent placement. EXAM: CHEST  2 VIEW COMPARISON:  12/07/2015. FINDINGS: Stable enlarged cardiac silhouette. The right jugular catheter tip remains in the superior vena cava. The interstitial markings are minimally prominent. Otherwise, clear lungs. Small to moderate-sized pleural effusion posteriorly, most likely on the  left. Aortic arch calcification. Mild thoracic spine degenerative changes. Minimal bilateral shoulder degenerative changes. IMPRESSION: 1. Small to moderate size left pleural effusion posteriorly at the lung bases. 2. Mild aortic atherosclerosis. 3. Minimal chronic interstitial lung disease. Electronically Signed   By: Beckie Salts M.D.   On: 12/24/2015 08:45   Dg Chest Portable 1 View  Result Date: 12/25/2015 CLINICAL DATA:  Chest pain since last night. EXAM: PORTABLE CHEST 1 VIEW COMPARISON:  December 24, 2015 FINDINGS: The heart size and mediastinal contours are stable. Right central venous sinus unchanged distal tip in superior vena cava. There is no focal infiltrate, pulmonary edema, or pleural effusion. The visualized skeletal structures are stable. IMPRESSION: No active cardiopulmonary disease. Electronically Signed   By: Sherian Rein M.D.   On: 12/25/2015 15:54     IMPRESSION AND PLAN:   50 year old female with past medical history of end-stage renal disease on hemodialysis, hypertension, hyperlipidemia, peripheral vascular disease, diabetes, history of coronary artery disease who presents to the hospital due to chest pain.  1. Chest pain-patient's chest pain is atypical but she does have risk factors and her troponin is mildly elevated 0.66. -We'll observe on telemetry, cycle her cardiac markers. Continue heparin nomogram. -I will get a cardiology consult.  2. End-stage renal disease on hemodialysis-patient gets scheduled dialysis on Tuesday Thursday Saturday. -I will consult nephrology.  3. Diabetes type 2 without complication-continue Lantus, sliding scale insulin.  4. Essential hypertension-continue lisinopril, metoprolol, Imdur.  5. History of seizures-continue Keppra.  6. GERD-continue Protonix.  7. Secondary hyperparathyroidism-continue calcium acetate.    All the records are reviewed and case discussed with ED provider. Management plans discussed with the patient,  family and they are in agreement.  CODE STATUS: Full  TOTAL TIME TAKING CARE OF THIS PATIENT: 45 minutes.    Houston Siren M.D on 12/25/2015 at 5:42 PM  Between 7am to 6pm - Pager - [phone]  After 6pm go to [email or web address] - password [email or web address] [facility]  [place] [facility]  Office  [phone]  CC: Primary care physician; Jarome Matin, MD

## 2015-12-25 NOTE — ED Notes (Signed)
Dialysis Team in room, obtaining peritoneal sample

## 2015-12-25 NOTE — ED Notes (Signed)
X-ray at bedside

## 2015-12-25 NOTE — ED Triage Notes (Signed)
Pt comes into the ED via EMS from [facility] with c/o chest pain since last night, states she was given 3 nitro SL without relief.. Pt states she is on 2L [place] continous.. Pt c/o having confusion and states she feels hot, pt is febrile on arrival..

## 2015-12-26 LAB — HEPARIN LEVEL (UNFRACTIONATED)
Heparin Unfractionated: <0.1 IU/mL — ABNORMAL LOW (ref 0.30–0.70)
Heparin Unfractionated: 0.19 IU/mL — ABNORMAL LOW (ref 0.30–0.70)

## 2015-12-26 LAB — BASIC METABOLIC PANEL
Anion gap: 7 (ref 5–15)
BUN: 68 mg/dL — ABNORMAL HIGH (ref 6–20)
CHLORIDE: 98 mmol/L — AB (ref 101–111)
CO2: 26 mmol/L (ref 22–32)
CREATININE: 6.05 mg/dL — AB (ref 0.44–1.00)
Calcium: 9.1 mg/dL (ref 8.9–10.3)
GFR calc non Af Amer: 7 mL/min — ABNORMAL LOW (ref >60)
GFR, EST AFRICAN AMERICAN: 8 mL/min — AB (ref >60)
Glucose, Bld: 186 mg/dL — ABNORMAL HIGH (ref 65–99)
POTASSIUM: 5.2 mmol/L — AB (ref 3.5–5.1)
SODIUM: 131 mmol/L — AB (ref 135–145)

## 2015-12-26 LAB — CBC
HCT: 22.5 % — ABNORMAL LOW (ref 35.0–47.0)
Hemoglobin: 7.4 g/dL — ABNORMAL LOW (ref 12.0–16.0)
MCH: 31.6 pg (ref 26.0–34.0)
MCHC: 32.8 g/dL (ref 32.0–36.0)
MCV: 96.4 fL (ref 80.0–100.0)
PLATELETS: 207 10*3/uL (ref 150–440)
RBC: 2.34 MIL/uL — AB (ref 3.80–5.20)
RDW: 14.1 % (ref 11.5–14.5)
WBC: 11 10*3/uL (ref 3.6–11.0)

## 2015-12-26 LAB — TROPONIN I
TROPONIN I: 0.63 ng/mL — AB (ref <0.03)
Troponin I: 0.54 ng/mL (ref <0.03)

## 2015-12-26 LAB — GLUCOSE, CAPILLARY
GLUCOSE-CAPILLARY: 198 mg/dL — AB (ref 65–99)
Glucose-Capillary: 172 mg/dL — ABNORMAL HIGH (ref 65–99)
Glucose-Capillary: 163 mg/dL — ABNORMAL HIGH (ref 65–99)
Glucose-Capillary: 134 mg/dL — ABNORMAL HIGH (ref 65–99)
Glucose-Capillary: 198 mg/dL — ABNORMAL HIGH (ref 65–99)

## 2015-12-26 LAB — MRSA PCR SCREENING: MRSA by PCR: POSITIVE — AB

## 2015-12-26 MED ORDER — LEVOFLOXACIN IN D5W 750 MG/150ML IV SOLN
750.0000 mg | Freq: Once | INTRAVENOUS | Status: AC
  Administered 2015-12-26: 750 mg via INTRAVENOUS
  Filled 2015-12-26: qty 150

## 2015-12-26 MED ORDER — HEPARIN SODIUM (PORCINE) 5000 UNIT/ML IJ SOLN
5000.0000 [IU] | Freq: Three times a day (TID) | INTRAMUSCULAR | Status: DC
  Administered 2015-12-26 – 2016-01-04 (×23): 5000 [IU] via SUBCUTANEOUS
  Filled 2015-12-26 (×24): qty 1

## 2015-12-26 MED ORDER — LEVOFLOXACIN IN D5W 500 MG/100ML IV SOLN
500.0000 mg | INTRAVENOUS | Status: DC
  Administered 2015-12-27 – 2015-12-29 (×2): 500 mg via INTRAVENOUS
  Filled 2015-12-26 (×3): qty 100

## 2015-12-26 MED ORDER — HEPARIN BOLUS VIA INFUSION
2900.0000 [IU] | Freq: Once | INTRAVENOUS | Status: AC
  Administered 2015-12-26: 2900 [IU] via INTRAVENOUS
  Filled 2015-12-26: qty 2900

## 2015-12-26 NOTE — Progress Notes (Addendum)
Patient ID: Kimberly Montgomery, female   DOB: 10/27/1965, 50 y.o.   MRN: [id number]   [employer] - [place] at [facility]   PATIENT NAME: Kimberly Montgomery    MR#:  [id number]  DATE OF BIRTH:  12/06/1965  SUBJECTIVE:  CHIEF COMPLAINT:   Chief Complaint  Patient presents with  . Chest Pain  50 year old female with past medical history of end-stage renal disease on hemodialysis, hypertension, hyperlipidemia, peripheral vascular disease, diabetes, history of coronary artery disease who Was admitted to the hospital due to chest pain. Today she states that her pain is completely resolved. She denies any chest pain, shortness of breath, nausea, vomiting, palpitations. Cardiology consultation and recommendations appreciated.   REVIEW OF SYSTEMS:  ROS CONSTITUTIONAL: No fever/chills, fatigue, weakness, weight gain/loss, headache. EYES: No blurry or double vision. ENT: No tinnitus, postnasal drip, redness or soreness of the oropharynx. RESPIRATORY: No cough, dyspnea, wheeze, hemoptysis.  CARDIOVASCULAR: No chest pain, palpitations, syncope, orthopnea,  GASTROINTESTINAL: No nausea, vomiting, constipation, diarrhea, abdominal pain, hematemesis, melena or hematochezia. GENITOURINARY: No dysuria, frequency, hematuria. ENDOCRINE: No polyuria or nocturia. No heat or cold intolerance. HEMATOLOGY: No anemia, bruising, bleeding. INTEGUMENTARY: No rashes, ulcers, lesions. MUSCULOSKELETAL: No arthritis, gout, edema. NEUROLOGIC: No numbness, tingling, ataxia, seizure-type activity, weakness. PSYCHIATRIC: No anxiety, depression, insomnia.   DRUG ALLERGIES:   Allergies  Allergen Reactions  . Cephalosporins Anaphylaxis  . Penicillins Anaphylaxis and Other (See Comments)    Has patient had a PCN reaction causing immediate rash, facial/tongue/throat swelling, SOB or lightheadedness with hypotension: Yes Has patient had a PCN reaction causing severe rash involving mucus membranes or skin  necrosis: No Has patient had a PCN reaction that required hospitalization No Has patient had a PCN reaction occurring within the last 10 years: No If all of the above answers are "NO", then may proceed with Cephalosporin use.  . Lamictal [Lamotrigine] Other (See Comments)    Reaction:  Hallucinations  . Phenergan [Promethazine Hcl] Nausea And Vomiting  . Pravastatin Other (See Comments)    Reaction:  Muscle pain   . Sulfa Antibiotics Other (See Comments)    Reaction:  Unknown    VITALS:  Blood pressure (!) 155/56, pulse 71, temperature 98.4 F (36.9 C), resp. rate 16, height 5\' 10"  (1.778 m), weight 119.3 kg (263 lb), last menstrual period 03/16/2015, SpO2 100 %. PHYSICAL EXAMINATION:  Physical Exam  GENERAL:  50 y.o.-year-old white female patient lying in the bed in no acute distress.  EYES: Pupils equal, round, reactive to light and accommodation. No scleral icterus. Extraocular muscles intact.  HEENT: Head atraumatic, normocephalic. Oropharynx and nasopharynx clear. No oropharyngeal erythema, moist oral mucosa  NECK:  Supple, no jugular venous distention. No thyroid enlargement, no tenderness.  LUNGS: Normal breath sounds bilaterally, no wheezing, rales, rhonchi. No use of accessory muscles of respiration.  CARDIOVASCULAR: S1, S2 RRR. No murmurs, rubs, gallops, clicks. Right chest wall dialysis catheter in place ABDOMEN: Soft, nontender, nondistended. Bowel sounds present. No organomegaly or mass. Positive peritoneal dialysis catheter in place without acute drainage. EXTREMITIES: No pedal edema, cyanosis, or clubbing. + 2 pedal & radial pulses b/l.   NEUROLOGIC: Cranial nerves II through XII are intact. No focal Motor or sensory deficits appreciated b/l.  Globally weak.   PSYCHIATRIC: The patient is alert and oriented x 2.  SKIN: No obvious rash, lesion, or ulcer.    LABORATORY PANEL:   CBC  Recent Labs Lab 12/26/15 0431  WBC 11.0  HGB 7.4*  HCT 22.5*  PLT  207    ------------------------------------------------------------------------------------------------------------------ Chemistries   Recent Labs Lab 12/25/15 1434 12/26/15 0431  NA 131* 131*  K 5.0 5.2*  CL 96* 98*  CO2 28 26  GLUCOSE 132* 186*  BUN 64* 68*  CREATININE 5.65* 6.05*  CALCIUM 9.3 9.1  AST 17  --   ALT 9*  --   ALKPHOS 66  --   BILITOT 0.5  --    RADIOLOGY:  Dg Chest Portable 1 View  Result Date: 12/25/2015 CLINICAL DATA:  Chest pain since last night. EXAM: PORTABLE CHEST 1 VIEW COMPARISON:  December 24, 2015 FINDINGS: The heart size and mediastinal contours are stable. Right central venous sinus unchanged distal tip in superior vena cava. There is no focal infiltrate, pulmonary edema, or pleural effusion. The visualized skeletal structures are stable. IMPRESSION: No active cardiopulmonary disease. Electronically Signed   By: Sherian Rein M.D.   On: 12/25/2015 15:54   ASSESSMENT AND PLAN:   1. Chest pain with elevated troponin which is trending down. 1.01-0.63-0.54. We'll continue to monitor on telemetry as patient will be getting her dialysis today.  2. Sepsis - blood cultures positive for enterococcus and Escherichia coli. Continue Levaquin and follow-up sensitivities. Urinalysis and chest x-ray have been negative.   2. End-stage renal disease on hemodialysis-patient gets scheduled dialysis on Tuesday Thursday Saturday.  3. Diabetes type 2 without complication-continue Lantus, sliding scale insulin.  4. Essential hypertension-continue lisinopril, metoprolol, Imdur.  5. History of seizures-continue Keppra.  6. GERD-continue Protonix.  7. Secondary hyperparathyroidism-continue calcium acetate.    All the records are reviewed and case discussed with Care Management/Social Worker. Management plans discussed with the patient, family and they are in agreement.  CODE STATUS: FULL  TOTAL TIME TAKING CARE OF THIS PATIENT: 25 minutes.   More than 50%  of the time was spent in counseling/coordination of care: YES  POSSIBLE D/C IN 1-2 DAYS, DEPENDING ON CLINICAL CONDITION.   Tonye Royalty M.D on 12/26/2015 at 1:07 PM  Between 7am to 6pm - Pager - [phone]  After 6pm go to [email or web address] - Social research officer, government  [employer] [place] [facility]  Office  [phone]  CC: Primary care physician; Jarome Matin, MD  Note: This dictation was prepared with Dragon dictation along with smaller phrase technology. Any transcriptional errors that result from this process are unintentional.

## 2015-12-26 NOTE — Progress Notes (Signed)
ANTICOAGULATION CONSULT NOTE - Initial Consult  Pharmacy Consult for heparin drip Indication: chest pain/ACS  Allergies  Allergen Reactions  . Cephalosporins Anaphylaxis  . Penicillins Anaphylaxis and Other (See Comments)    Has patient had a PCN reaction causing immediate rash, facial/tongue/throat swelling, SOB or lightheadedness with hypotension: Yes Has patient had a PCN reaction causing severe rash involving mucus membranes or skin necrosis: No Has patient had a PCN reaction that required hospitalization No Has patient had a PCN reaction occurring within the last 10 years: No If all of the above answers are "NO", then may proceed with Cephalosporin use.  . Lamictal [Lamotrigine] Other (See Comments)    Reaction:  Hallucinations  . Phenergan [Promethazine Hcl] Nausea And Vomiting  . Pravastatin Other (See Comments)    Reaction:  Muscle pain   . Sulfa Antibiotics Other (See Comments)    Reaction:  Unknown     Patient Measurements: Height: 5\' 10"  (177.8 cm) Weight: 263 lb (119.3 kg) IBW/kg (Calculated) : 68.5 Heparin Dosing Weight: 96 kg  Vital Signs: Temp: 98.9 F (37.2 C) (09/25 2002) Temp Source: Oral (09/25 2002) BP: 124/52 (09/25 2002) Pulse Rate: 75 (09/25 2002)  Labs:  Recent Labs  12/24/15 0816 12/24/15 1206 12/25/15 1434 12/25/15 1630 12/25/15 2036 12/26/15 0120  HGB 9.6*  --  7.7*  --   --   --   HCT 28.1*  --  23.5*  --   --   --   PLT 299  --  226  --   --   --   APTT  --   --   --  39*  --   --   LABPROT  --   --   --  13.5  --   --   INR  --   --   --  1.03  --   --   HEPARINUNFRC  --   --   --   --   --  <0.10*  CREATININE 4.51*  --  5.65*  --   --   --   TROPONINI 0.04* 0.03* 0.66*  --  1.01*  --     Estimated Creatinine Clearance: 16.3 mL/min (by C-G formula based on SCr of 5.65 mg/dL (H)).   Medical History: Past Medical History:  Diagnosis Date  . Asthma   . CAD (coronary artery disease)   . CHF (congestive heart failure) (HCC)    . Diabetes mellitus without complication (HCC)   . Endometriosis   . ESRD (end stage renal disease) (HCC)   . Gastroparesis   . GERD (gastroesophageal reflux disease)   . HLD (hyperlipidemia)   . Hypertension   . Renal disorder     Assessment: Pharmacy consulted to dose and monitor heparin in this 50 year old female for ACS. Patient denies taking any anticoagulants prior to admission. Baseline labs have been ordered.  Goal of Therapy:  Heparin level 0.3-0.7 units/ml Monitor platelets by anticoagulation protocol: Yes   Plan:  First heparin level subtherapeutic. 2900 units IV bolus x 1 and increase rate to 1450 units/hr. Will recheck HL in 8 hours.  Carola Frost, PharmD, BCPS Clinical Pharmacist 12/26/2015,2:50 AM

## 2015-12-26 NOTE — Progress Notes (Signed)
Pt ended tx 1.5 hours early d/t fever, chills and general body aches. Tylenol 650mg  PO given. MD notified, T. Todd RN notified.

## 2015-12-26 NOTE — Consult Note (Signed)
Kimberly Montgomery is a 49 y.o. female  [id number]  Primary Cardiologist: Adrian Blackwater Reason for Consultation: Chest pain, elevated troponin  HPI: The patient has been experiencing chest pain for several days. This is substernal and radiating to the left neck. She had been sent to the ED by the SNF and had negative troponins. She was to follow up at Dr Milta Deiters office, however the pain became progressively worse and she again presented to the hospital. Her troponins have elevated to a max of 1.01 and trended downward to 0.54 this morning. She has heparin infusing.   11/20/2015: Cardiac cath findings 1st Mrg lesion, 50 %stenosed.  Prox LAD to Mid LAD lesion, 30 %stenosed.  Dist LAD lesion, 50 %stenosed.  Prox RCA to Dist RCA lesion, 35 %stenosed. Stent in the mid LAD was patent and there is mild to moderate disease in the distal LAD is as well as the in stent in the right coronary is patent. LV gram was deferred due to renal insufficiency.   Review of Systems: Positive for intermittent chest discomfort, no current shortness of breath, dizziness, nausea or diaphoresis   Past Medical History:  Diagnosis Date  . Asthma   . CAD (coronary artery disease)   . CHF (congestive heart failure) (HCC)   . Diabetes mellitus without complication (HCC)   . Endometriosis   . ESRD (end stage renal disease) (HCC)   . Gastroparesis   . GERD (gastroesophageal reflux disease)   . HLD (hyperlipidemia)   . Hypertension   . Renal disorder     Medications Prior to Admission  Medication Sig Dispense Refill  . Amino Acids-Protein Hydrolys (FEEDING SUPPLEMENT, PRO-STAT SUGAR FREE 64,) LIQD Take 30 mLs by mouth 3 (three) times daily with meals.    . bisacodyl (DULCOLAX) 10 MG suppository Place 10 mg rectally daily as needed for moderate constipation. Use if no relief from Milk of Magnesia    . Bismuth Tribromoph-Petrolatum (XEROFORM PETROLATUM DRESSING) PADS Apply 1 each topically every 3 (three) days.    .  calcitRIOL (ROCALTROL) 0.25 MCG capsule Take 0.25 mcg by mouth daily.    . calcium acetate (PHOSLO) 667 MG capsule Take 3 capsules (2,001 mg total) by mouth 3 (three) times daily with meals. 90 capsule 0  . calcium carbonate (TUMS - DOSED IN MG ELEMENTAL CALCIUM) 500 MG chewable tablet Chew 1 tablet (200 mg of elemental calcium total) by mouth 3 (three) times daily with meals. 120 tablet 0  . clopidogrel (PLAVIX) 75 MG tablet Take 75 mg by mouth daily.    . colesevelam (WELCHOL) 625 MG tablet Take 1 tablet (625 mg total) by mouth 2 (two) times daily with a meal. 60 tablet 6  . docusate sodium (COLACE) 100 MG capsule Take 100 mg by mouth at bedtime.     . hydrALAZINE (APRESOLINE) 25 MG tablet Take 25 mg by mouth daily.     . insulin aspart (NOVOLOG FLEXPEN) 100 UNIT/ML FlexPen Inject 0-10 Units into the skin 3 (three) times daily with meals. Inject per sliding scale: If 0-150 = 0    151-200 = 2 units   201-250 = 4 units   251-300 = 6 units    301-350 = 8 units    351-400 = 10 units  Anything above 400, call MD    . insulin glargine (LANTUS) 100 UNIT/ML injection Inject 0.08 mLs (8 Units total) into the skin at bedtime. 10 mL 11  . isosorbide mononitrate (IMDUR) 60 MG 24 hr tablet Take  1 tablet (60 mg total) by mouth daily. 30 tablet 00  . lactulose (CHRONULAC) 10 GM/15ML solution Take 10 g by mouth daily.     [name] levETIRAcetam (KEPPRA) 500 MG tablet Take 1 tablet (500 mg total) by mouth every morning. 30 tablet 0  . lisinopril (PRINIVIL,ZESTRIL) 40 MG tablet Take 40 mg by mouth daily.  3  . metoprolol (LOPRESSOR) 50 MG tablet Take 1 tablet (50 mg total) by mouth 2 (two) times daily.    . mupirocin ointment (BACTROBAN) 2 % Place 1 application into the nose 2 (two) times daily. 22 g 0  . nicotine (NICODERM CQ - DOSED IN MG/24 HOURS) 14 mg/24hr patch Place 14 mg onto the skin daily.    [name] nystatin (MYCOSTATIN) powder Apply 1 g topically 2 (two) times daily.    [name] omeprazole (PRILOSEC) 20 MG capsule  Take 40 mg by mouth 2 (two) times daily before a meal.    . polyethylene glycol (MIRALAX / GLYCOLAX) packet Take 17 g by mouth daily. 14 each 0  . ranolazine (RANEXA) 500 MG 12 hr tablet Take 1 tablet (500 mg total) by mouth 2 (two) times daily. 60 tablet 0  . risperiDONE (RISPERDAL) 1 MG tablet Take 1 mg by mouth 3 (three) times daily.     [name] torsemide (DEMADEX) 100 MG tablet Take 100 mg by mouth daily.    [name] erythromycin (E-MYCIN) 250 MG tablet Take 250 mg by mouth 4 (four) times daily.    . insulin aspart (NOVOLOG) 100 UNIT/ML injection Inject 0-10 Units into the skin 3 (three) times daily with meals. 0-150=0units 151-200=2units 201-250=4units 251-300=6units 301-350=8units 351-400=10 units    . metoCLOPramide (REGLAN) 10 MG tablet Take 1 tablet (10 mg total) by mouth 4 (four) times daily. (Patient not taking: Reported on 12/25/2015) 90 tablet 0  . metolazone (ZAROXOLYN) 5 MG tablet Take 5 mg by mouth daily as needed (fluid).    . nitroGLYCERIN (NITROSTAT) 0.4 MG SL tablet Place 0.4 mg under the tongue every 5 (five) minutes as needed for chest pain. Reported on 07/10/2015    . ondansetron (ZOFRAN ODT) 4 MG disintegrating tablet Take 1 tablet (4 mg total) by mouth every 6 (six) hours as needed for nausea or vomiting. (Patient not taking: Reported on 12/25/2015) 20 tablet 0  . oxyCODONE (OXY IR/ROXICODONE) 5 MG immediate release tablet Take 1 tablet (5 mg total) by mouth every 6 (six) hours as needed for severe pain. (Patient not taking: Reported on 12/25/2015) 20 tablet 0  . senna (SENOKOT) 8.6 MG TABS tablet Take 2 tablets by mouth 2 (two) times daily.        . calcitRIOL  0.25 mcg Oral Daily  . calcium acetate  2,001 mg Oral TID WC  . calcium carbonate  1 tablet Oral TID WC  . clopidogrel  75 mg Oral Daily  . colesevelam  625 mg Oral BID WC  . docusate sodium  100 mg Oral QHS  . feeding supplement (PRO-STAT SUGAR FREE 64)  30 mL Oral TID WC  . hydrALAZINE  25 mg Oral Daily  . insulin  aspart  0-10 Units Subcutaneous TID WC  . insulin glargine  8 Units Subcutaneous QHS  . isosorbide mononitrate  60 mg Oral Daily  . lactulose  10 g Oral Daily  . levETIRAcetam  500 mg Oral q morning - 10a  . levofloxacin (LEVAQUIN) IV  750 mg Intravenous Once   Followed by  . [START ON 12/27/2015] levofloxacin (LEVAQUIN) IV  500 mg Intravenous Q48H  . lisinopril  40 mg Oral Daily  . metoprolol  50 mg Oral BID  . mupirocin ointment  1 application Nasal BID  . nicotine  14 mg Transdermal Daily  . nystatin  1 g Topical BID  . pantoprazole  40 mg Oral Daily  . polyethylene glycol  17 g Oral Daily  . ranolazine  500 mg Oral BID  . risperiDONE  1 mg Oral TID  . senna  2 tablet Oral BID  . sodium chloride flush  3 mL Intravenous Q12H  . torsemide  100 mg Oral Daily  . white petrolatum   Topical Q72H    Infusions: . heparin 1,450 Units/hr (12/26/15 0323)    Allergies  Allergen Reactions  . Cephalosporins Anaphylaxis  . Penicillins Anaphylaxis and Other (See Comments)    Has patient had a PCN reaction causing immediate rash, facial/tongue/throat swelling, SOB or lightheadedness with hypotension: Yes Has patient had a PCN reaction causing severe rash involving mucus membranes or skin necrosis: No Has patient had a PCN reaction that required hospitalization No Has patient had a PCN reaction occurring within the last 10 years: No If all of the above answers are "NO", then may proceed with Cephalosporin use.  . Lamictal [Lamotrigine] Other (See Comments)    Reaction:  Hallucinations  . Phenergan [Promethazine Hcl] Nausea And Vomiting  . Pravastatin Other (See Comments)    Reaction:  Muscle pain   . Sulfa Antibiotics Other (See Comments)    Reaction:  Unknown     Social History   Social History  . Marital status: Widowed    Spouse name: N/A  . Number of children: N/A  . Years of education: N/A   Occupational History  . disabled    Social History Main Topics  . Smoking  status: Former Smoker    Packs/day: 0.50    Types: Cigarettes  . Smokeless tobacco: Never Used  . Alcohol use No  . Drug use: No  . Sexual activity: No   Other Topics Concern  . Not on file   Social History Narrative  . No narrative on file    Family History  Problem Relation Age of Onset  . CAD    . Diabetes    . Bipolar disorder    . Cervical cancer Mother     PHYSICAL EXAM: Vitals:   12/25/15 2002 12/26/15 0333  BP: (!) 124/52 (!) 147/57  Pulse: 75 (!) 107  Resp: 15   Temp: 98.9 F (37.2 C) (!) 100.5 F (38.1 C)     Intake/Output Summary (Last 24 hours) at 12/26/15 6237 Last data filed at 12/26/15 0400  Gross per 24 hour  Intake           136.18 ml  Output                0 ml  Net           136.18 ml    General:  Well appearing. No respiratory difficulty HEENT: normal Neck: supple. no JVD. Carotids 2+ bilat; no bruits. No lymphadenopathy or thryomegaly appreciated. Cor: PMI nondisplaced. Regular rate & rhythm. No rubs, gallops or murmurs. Lungs: clear Abdomen: soft, nontender, nondistended. No hepatosplenomegaly. No bruits or masses. Good bowel sounds. Extremities: no cyanosis, clubbing, rash, edema Neuro: alert & oriented x 3, cranial nerves grossly intact. moves all 4 extremities w/o difficulty. Affect pleasant.  ECG: NSR 84 bpm. Non-specific ST changes, no active ischemia  Results for  orders placed or performed during the hospital encounter of 12/25/15 (from the past 24 hour(s))  Blood culture (routine x 2)     Status: None (Preliminary result)   Collection Time: 12/25/15  2:32 PM  Result Value Ref Range   Specimen Description BLOOD  L HAND    Special Requests      BOTTLES DRAWN AEROBIC AND ANAEROBIC  AER 12 ML ANA 14 ML   Culture  Setup Time      GRAM NEGATIVE RODS IN BOTH AEROBIC AND ANAEROBIC BOTTLES CRITICAL VALUE NOTED.  VALUE IS CONSISTENT WITH PREVIOUSLY REPORTED AND CALLED VALUE. SDR    Culture GRAM NEGATIVE RODS    Report Status  PENDING   Blood culture (routine x 2)     Status: None (Preliminary result)   Collection Time: 12/25/15  2:32 PM  Result Value Ref Range   Specimen Description BLOOD  L FA    Special Requests      BOTTLES DRAWN AEROBIC AND ANAEROBIC  AER 12 ML ANA 12 ML   Culture  Setup Time      Organism ID to follow GRAM NEGATIVE RODS IN BOTH AEROBIC AND ANAEROBIC BOTTLES CRITICAL RESULT CALLED TO, READ BACK BY AND VERIFIED WITH: NATE COOKSON AT 0535 12/26/15 SDR    Culture GRAM NEGATIVE RODS    Report Status PENDING   Lactic acid, plasma     Status: None   Collection Time: 12/25/15  2:32 PM  Result Value Ref Range   Lactic Acid, Venous 1.1 0.5 - 1.9 mmol/L  Urinalysis complete, with microscopic ([facility] only)     Status: Abnormal   Collection Time: 12/25/15  2:32 PM  Result Value Ref Range   Color, Urine YELLOW (A) YELLOW   APPearance HAZY (A) CLEAR   Glucose, UA 150 (A) NEGATIVE mg/dL   Bilirubin Urine NEGATIVE NEGATIVE   Ketones, ur NEGATIVE NEGATIVE mg/dL   Specific Gravity, Urine 1.019 1.005 - 1.030   Hgb urine dipstick NEGATIVE NEGATIVE   pH 5.0 5.0 - 8.0   Protein, ur >500 (A) NEGATIVE mg/dL   Nitrite NEGATIVE NEGATIVE   Leukocytes, UA NEGATIVE NEGATIVE   RBC / HPF 0-5 0 - 5 RBC/hpf   WBC, UA 0-5 0 - 5 WBC/hpf   Bacteria, UA NONE SEEN NONE SEEN   Squamous Epithelial / LPF 0-5 (A) NONE SEEN   Mucous PRESENT   Blood Culture ID Panel (Reflexed)     Status: Abnormal   Collection Time: 12/25/15  2:32 PM  Result Value Ref Range   Enterococcus species NOT DETECTED NOT DETECTED   Listeria monocytogenes NOT DETECTED NOT DETECTED   Staphylococcus species NOT DETECTED NOT DETECTED   Staphylococcus aureus NOT DETECTED NOT DETECTED   Streptococcus species NOT DETECTED NOT DETECTED   Streptococcus agalactiae NOT DETECTED NOT DETECTED   Streptococcus pneumoniae NOT DETECTED NOT DETECTED   Streptococcus pyogenes NOT DETECTED NOT DETECTED   Acinetobacter baumannii NOT DETECTED NOT DETECTED    Enterobacteriaceae species DETECTED (A) NOT DETECTED   Enterobacter cloacae complex NOT DETECTED NOT DETECTED   Escherichia coli DETECTED (A) NOT DETECTED   Klebsiella oxytoca NOT DETECTED NOT DETECTED   Klebsiella pneumoniae NOT DETECTED NOT DETECTED   Proteus species NOT DETECTED NOT DETECTED   Serratia marcescens NOT DETECTED NOT DETECTED   Carbapenem resistance NOT DETECTED NOT DETECTED   Haemophilus influenzae NOT DETECTED NOT DETECTED   Neisseria meningitidis NOT DETECTED NOT DETECTED   Pseudomonas aeruginosa NOT DETECTED NOT DETECTED   Candida albicans  NOT DETECTED NOT DETECTED   Candida glabrata NOT DETECTED NOT DETECTED   Candida krusei NOT DETECTED NOT DETECTED   Candida parapsilosis NOT DETECTED NOT DETECTED   Candida tropicalis NOT DETECTED NOT DETECTED  CBC with Differential     Status: Abnormal   Collection Time: 12/25/15  2:34 PM  Result Value Ref Range   WBC 11.0 3.6 - 11.0 K/uL   RBC 2.47 (L) 3.80 - 5.20 MIL/uL   Hemoglobin 7.7 (L) 12.0 - 16.0 g/dL   HCT 16.1 (L) 09.6 - 04.5 %   MCV 95.2 80.0 - 100.0 fL   MCH 31.2 26.0 - 34.0 pg   MCHC 32.8 32.0 - 36.0 g/dL   RDW 40.9 81.1 - 91.4 %   Platelets 226 150 - 440 K/uL   Neutrophils Relative % 88 %   Neutro Abs 9.6 (H) 1.4 - 6.5 K/uL   Lymphocytes Relative 6 %   Lymphs Abs 0.6 (L) 1.0 - 3.6 K/uL   Monocytes Relative 6 %   Monocytes Absolute 0.7 0.2 - 0.9 K/uL   Eosinophils Relative 0 %   Eosinophils Absolute 0.0 0 - 0.7 K/uL   Basophils Relative 0 %   Basophils Absolute 0.0 0 - 0.1 K/uL  Comprehensive metabolic panel     Status: Abnormal   Collection Time: 12/25/15  2:34 PM  Result Value Ref Range   Sodium 131 (L) 135 - 145 mmol/L   Potassium 5.0 3.5 - 5.1 mmol/L   Chloride 96 (L) 101 - 111 mmol/L   CO2 28 22 - 32 mmol/L   Glucose, Bld 132 (H) 65 - 99 mg/dL   BUN 64 (H) 6 - 20 mg/dL   Creatinine, Ser 7.82 (H) 0.44 - 1.00 mg/dL   Calcium 9.3 8.9 - 95.6 mg/dL   Total Protein 5.0 (L) 6.5 - 8.1 g/dL   Albumin  2.2 (L) 3.5 - 5.0 g/dL   AST 17 15 - 41 U/L   ALT 9 (L) 14 - 54 U/L   Alkaline Phosphatase 66 38 - 126 U/L   Total Bilirubin 0.5 0.3 - 1.2 mg/dL   GFR calc non Af Amer 8 (L) >60 mL/min   GFR calc Af Amer 9 (L) >60 mL/min   Anion gap 7 5 - 15  Troponin I     Status: Abnormal   Collection Time: 12/25/15  2:34 PM  Result Value Ref Range   Troponin I 0.66 (HH) <0.03 ng/mL  Gram stain     Status: None (Preliminary result)   Collection Time: 12/25/15  4:00 PM  Result Value Ref Range   Specimen Description FLUID PERITONEAL DIALYSATE    Special Requests NONE    Gram Stain      FEW WBC PRESENT,BOTH PMN AND MONONUCLEAR NO ORGANISMS SEEN Performed at [facility]    Report Status PENDING   Protime-INR     Status: None   Collection Time: 12/25/15  4:30 PM  Result Value Ref Range   Prothrombin Time 13.5 11.4 - 15.2 seconds   INR 1.03   APTT     Status: Abnormal   Collection Time: 12/25/15  4:30 PM  Result Value Ref Range   aPTT 39 (H) 24 - 36 seconds  Troponin I     Status: Abnormal   Collection Time: 12/25/15  8:36 PM  Result Value Ref Range   Troponin I 1.01 (HH) <0.03 ng/mL  Glucose, capillary     Status: Abnormal   Collection Time: 12/25/15 11:08 PM  Result Value Ref Range   Glucose-Capillary 133 (H) 65 - 99 mg/dL  Heparin level (unfractionated)     Status: Abnormal   Collection Time: 12/26/15  1:20 AM  Result Value Ref Range   Heparin Unfractionated <0.10 (L) 0.30 - 0.70 IU/mL  Troponin I     Status: Abnormal   Collection Time: 12/26/15  1:20 AM  Result Value Ref Range   Troponin I 0.63 (HH) <0.03 ng/mL  Glucose, capillary     Status: Abnormal   Collection Time: 12/26/15  3:40 AM  Result Value Ref Range   Glucose-Capillary 198 (H) 65 - 99 mg/dL  CBC     Status: Abnormal   Collection Time: 12/26/15  4:31 AM  Result Value Ref Range   WBC 11.0 3.6 - 11.0 K/uL   RBC 2.34 (L) 3.80 - 5.20 MIL/uL   Hemoglobin 7.4 (L) 12.0 - 16.0 g/dL   HCT 16.1 (L) 09.6 - 04.5 %    MCV 96.4 80.0 - 100.0 fL   MCH 31.6 26.0 - 34.0 pg   MCHC 32.8 32.0 - 36.0 g/dL   RDW 40.9 81.1 - 91.4 %   Platelets 207 150 - 440 K/uL  Basic metabolic panel     Status: Abnormal   Collection Time: 12/26/15  4:31 AM  Result Value Ref Range   Sodium 131 (L) 135 - 145 mmol/L   Potassium 5.2 (H) 3.5 - 5.1 mmol/L   Chloride 98 (L) 101 - 111 mmol/L   CO2 26 22 - 32 mmol/L   Glucose, Bld 186 (H) 65 - 99 mg/dL   BUN 68 (H) 6 - 20 mg/dL   Creatinine, Ser 7.82 (H) 0.44 - 1.00 mg/dL   Calcium 9.1 8.9 - 95.6 mg/dL   GFR calc non Af Amer 7 (L) >60 mL/min   GFR calc Af Amer 8 (L) >60 mL/min   Anion gap 7 5 - 15  Troponin I     Status: Abnormal   Collection Time: 12/26/15  4:31 AM  Result Value Ref Range   Troponin I 0.54 (HH) <0.03 ng/mL   Dg Chest 2 View  Result Date: 12/24/2015 CLINICAL DATA:  Chest pain this morning. Previous coronary artery stent placement. EXAM: CHEST  2 VIEW COMPARISON:  12/07/2015. FINDINGS: Stable enlarged cardiac silhouette. The right jugular catheter tip remains in the superior vena cava. The interstitial markings are minimally prominent. Otherwise, clear lungs. Small to moderate-sized pleural effusion posteriorly, most likely on the left. Aortic arch calcification. Mild thoracic spine degenerative changes. Minimal bilateral shoulder degenerative changes. IMPRESSION: 1. Small to moderate size left pleural effusion posteriorly at the lung bases. 2. Mild aortic atherosclerosis. 3. Minimal chronic interstitial lung disease. Electronically Signed   By: Beckie Salts M.D.   On: 12/24/2015 08:45   Dg Chest Portable 1 View  Result Date: 12/25/2015 CLINICAL DATA:  Chest pain since last night. EXAM: PORTABLE CHEST 1 VIEW COMPARISON:  December 24, 2015 FINDINGS: The heart size and mediastinal contours are stable. Right central venous sinus unchanged distal tip in superior vena cava. There is no focal infiltrate, pulmonary edema, or pleural effusion. The visualized skeletal  structures are stable. IMPRESSION: No active cardiopulmonary disease. Electronically Signed   By: Sherian Rein M.D.   On: 12/25/2015 15:54     ASSESSMENT AND PLAN: Chest pain with mildly elevated troponins, trending back down. Pt had episode of atrial fib over the weekend, however she is now in normal sinus rhythm in the 70's. She presented with fever  and is anemic with Hgb of 7.4 and elevated troponin may be related to demand ischemia due to infection and anemia. She had a cardiac cath on 11/20/2015 which showed Stent in the mid LAD was patent and there is mild to moderate disease in the distal LAD is as well as the in stent in the right coronary is patent. An echo on 09/13/2015 showed Normal LVEF and wall motion and mild diastolic dysfunction.   Advise to treat possible infection and monitor cardiac status.  Berton Bon, NP 12/26/2015 8:21 AM

## 2015-12-26 NOTE — Progress Notes (Signed)
Pre hd assessment  

## 2015-12-26 NOTE — Progress Notes (Signed)
Post hd assessment 

## 2015-12-26 NOTE — Progress Notes (Signed)
Post hd vitals 

## 2015-12-26 NOTE — Progress Notes (Signed)
[facility]  ROUNDING NOTE   Subjective:  Patient well-known to Kimberly Montgomery. Presented for recurrent chest pain. Minimal troponin elevations noted. She is due for hemodialysis today. There is also mild hyperkalemia noted with a potassium of 5.2 and patient is also hyponatremic with a serum sodium of 131. 2 blood cultures abnormal as well.   Objective:  Vital signs in last 24 hours:  Temp:  [98.4 F (36.9 C)-100.5 F (38.1 C)] 98.4 F (36.9 C) (09/26 1143) Pulse Rate:  [71-107] 71 (09/26 1143) Resp:  [14-24] 16 (09/26 1143) BP: (110-155)/(51-60) 155/56 (09/26 1143) SpO2:  [93 %-100 %] 100 % (09/26 1143) Weight:  [119.3 kg (263 lb)] 119.3 kg (263 lb) (09/25 1427)  Weight change:  Filed Weights   12/25/15 1427  Weight: 119.3 kg (263 lb)    Intake/Output: I/O last 3 completed shifts: In: 136.2 [I.V.:136.2] Out: -    Intake/Output this shift:  No intake/output data recorded.  Physical Exam: General: No acute distress  Head: Normocephalic, atraumatic. Moist oral mucosal membranes  Eyes: Anicteric  Neck: Supple, trachea midline  Lungs:  Clear to auscultation, normal effort  Heart: S1S2 no rubs  Abdomen:  Soft, nontender, BS present, PD catheter in place  Extremities: RLE amputation noted  Neurologic: Nonfocal, moving all four extremities  Skin: No lesions  Access: R IJ PC, PD cath also in place    Basic Metabolic Panel:  Recent Labs Lab 12/24/15 0816 12/25/15 1434 12/26/15 0431  NA 132* 131* 131*  K 4.2 5.0 5.2*  CL 97* 96* 98*  CO2 30 28 26   GLUCOSE 115* 132* 186*  BUN 45* 64* 68*  CREATININE 4.51* 5.65* 6.05*  CALCIUM 9.2 9.3 9.1    Liver Function Tests:  Recent Labs Lab 12/25/15 1434  AST 17  ALT 9*  ALKPHOS 66  BILITOT 0.5  PROT 5.0*  ALBUMIN 2.2*   No results for input(s): LIPASE, AMYLASE in the last 168 hours. No results for input(s): AMMONIA in the last 168 hours.  CBC:  Recent Labs Lab 12/24/15 0816 12/25/15 1434  12/26/15 0431  WBC 6.6 11.0 11.0  NEUTROABS  --  9.6*  --   HGB 9.6* 7.7* 7.4*  HCT 28.1* 23.5* 22.5*  MCV 94.3 95.2 96.4  PLT 299 226 207    Cardiac Enzymes:  Recent Labs Lab 12/24/15 1206 12/25/15 1434 12/25/15 2036 12/26/15 0120 12/26/15 0431  TROPONINI 0.03* 0.66* 1.01* 0.63* 0.54*    BNP: Invalid input(s): POCBNP  CBG:  Recent Labs Lab 12/25/15 2308 12/26/15 0340 12/26/15 0842 12/26/15 1144  GLUCAP 133* 198* 172* 163*    Microbiology: Results for orders placed or performed during the hospital encounter of 12/25/15  Blood culture (routine x 2)     Status: None (Preliminary result)   Collection Time: 12/25/15  2:32 PM  Result Value Ref Range Status   Specimen Description BLOOD  L HAND  Final   Special Requests   Final    BOTTLES DRAWN AEROBIC AND ANAEROBIC  AER 12 ML ANA 14 ML   Culture  Setup Time   Final    GRAM NEGATIVE RODS IN BOTH AEROBIC AND ANAEROBIC BOTTLES CRITICAL VALUE NOTED.  VALUE IS CONSISTENT WITH PREVIOUSLY REPORTED AND CALLED VALUE. SDR    Culture GRAM NEGATIVE RODS  Final   Report Status PENDING  Incomplete  Blood culture (routine x 2)     Status: None (Preliminary result)   Collection Time: 12/25/15  2:32 PM  Result Value Ref Range Status  Specimen Description BLOOD  L FA  Final   Special Requests   Final    BOTTLES DRAWN AEROBIC AND ANAEROBIC  AER 12 ML ANA 12 ML   Culture  Setup Time   Final    GRAM NEGATIVE RODS IN BOTH AEROBIC AND ANAEROBIC BOTTLES CRITICAL RESULT CALLED TO, READ BACK BY AND VERIFIED WITH: NATE COOKSON AT 0535 12/26/15 SDR Performed at [facility]    Culture GRAM NEGATIVE RODS  Final   Report Status PENDING  Incomplete  Blood Culture ID Panel (Reflexed)     Status: Abnormal   Collection Time: 12/25/15  2:32 PM  Result Value Ref Range Status   Enterococcus species NOT DETECTED NOT DETECTED Final   Listeria monocytogenes NOT DETECTED NOT DETECTED Final   Staphylococcus species NOT DETECTED NOT  DETECTED Final   Staphylococcus aureus NOT DETECTED NOT DETECTED Final   Streptococcus species NOT DETECTED NOT DETECTED Final   Streptococcus agalactiae NOT DETECTED NOT DETECTED Final   Streptococcus pneumoniae NOT DETECTED NOT DETECTED Final   Streptococcus pyogenes NOT DETECTED NOT DETECTED Final   Acinetobacter baumannii NOT DETECTED NOT DETECTED Final   Enterobacteriaceae species DETECTED (A) NOT DETECTED Final    Comment: CRITICAL RESULT CALLED TO, READ BACK BY AND VERIFIED WITH:  NATE COOKSON AT 0535 12/26/15 SDR    Enterobacter cloacae complex NOT DETECTED NOT DETECTED Final   Escherichia coli DETECTED (A) NOT DETECTED Final    Comment: CRITICAL RESULT CALLED TO, READ BACK BY AND VERIFIED WITH:  NATE COOKSON AT 0535 12/26/15 SDR    Klebsiella oxytoca NOT DETECTED NOT DETECTED Final   Klebsiella pneumoniae NOT DETECTED NOT DETECTED Final   Proteus species NOT DETECTED NOT DETECTED Final   Serratia marcescens NOT DETECTED NOT DETECTED Final   Carbapenem resistance NOT DETECTED NOT DETECTED Final   Haemophilus influenzae NOT DETECTED NOT DETECTED Final   Neisseria meningitidis NOT DETECTED NOT DETECTED Final   Pseudomonas aeruginosa NOT DETECTED NOT DETECTED Final   Candida albicans NOT DETECTED NOT DETECTED Final   Candida glabrata NOT DETECTED NOT DETECTED Final   Candida krusei NOT DETECTED NOT DETECTED Final   Candida parapsilosis NOT DETECTED NOT DETECTED Final   Candida tropicalis NOT DETECTED NOT DETECTED Final  Culture, body fluid-bottle     Status: None (Preliminary result)   Collection Time: 12/25/15  4:00 PM  Result Value Ref Range Status   Specimen Description FLUID PERITONEAL DIALYSATE  Final   Special Requests BOTTLES DRAWN AEROBIC AND ANAEROBIC 10CC  Final   Culture   Final    NO GROWTH < 24 HOURS Performed at [facility]    Report Status PENDING  Incomplete  Gram stain     Status: None (Preliminary result)   Collection Time: 12/25/15  4:00 PM   Result Value Ref Range Status   Specimen Description FLUID PERITONEAL DIALYSATE  Final   Special Requests NONE  Final   Gram Stain   Final    FEW WBC PRESENT,BOTH PMN AND MONONUCLEAR NO ORGANISMS SEEN Performed at [facility]    Report Status PENDING  Incomplete    Coagulation Studies:  Recent Labs  12/25/15 1630  LABPROT 13.5  INR 1.03    Urinalysis:  Recent Labs  12/25/15 1432  COLORURINE YELLOW*  LABSPEC 1.019  PHURINE 5.0  GLUCOSEU 150*  HGBUR NEGATIVE  BILIRUBINUR NEGATIVE  KETONESUR NEGATIVE  PROTEINUR >500*  NITRITE NEGATIVE  LEUKOCYTESUR NEGATIVE      Imaging: Dg Chest Portable 1  View  Result Date: 12/25/2015 CLINICAL DATA:  Chest pain since last night. EXAM: PORTABLE CHEST 1 VIEW COMPARISON:  December 24, 2015 FINDINGS: The heart size and mediastinal contours are stable. Right central venous sinus unchanged distal tip in superior vena cava. There is no focal infiltrate, pulmonary edema, or pleural effusion. The visualized skeletal structures are stable. IMPRESSION: No active cardiopulmonary disease. Electronically Signed   By: Sherian ReinWei-Chen  Lin M.D.   On: 12/25/2015 15:54     Medications:     . calcitRIOL  0.25 mcg Oral Daily  . calcium acetate  2,001 mg Oral TID WC  . calcium carbonate  1 tablet Oral TID WC  . clopidogrel  75 mg Oral Daily  . colesevelam  625 mg Oral BID WC  . docusate sodium  100 mg Oral QHS  . feeding supplement (PRO-STAT SUGAR FREE 64)  30 mL Oral TID WC  . heparin subcutaneous  5,000 Units Subcutaneous Q8H  . hydrALAZINE  25 mg Oral Daily  . insulin aspart  0-10 Units Subcutaneous TID WC  . insulin glargine  8 Units Subcutaneous QHS  . isosorbide mononitrate  60 mg Oral Daily  . lactulose  10 g Oral Daily  . levETIRAcetam  500 mg Oral q morning - 10a  . [START ON 12/27/2015] levofloxacin (LEVAQUIN) IV  500 mg Intravenous Q48H  . lisinopril  40 mg Oral Daily  . metoprolol  50 mg Oral BID  . mupirocin ointment  1  application Nasal BID  . nicotine  14 mg Transdermal Daily  . nystatin  1 g Topical BID  . pantoprazole  40 mg Oral Daily  . polyethylene glycol  17 g Oral Daily  . ranolazine  500 mg Oral BID  . risperiDONE  1 mg Oral TID  . senna  2 tablet Oral BID  . sodium chloride flush  3 mL Intravenous Q12H  . torsemide  100 mg Oral Daily  . white petrolatum   Topical Q72H   acetaminophen **OR** acetaminophen, bisacodyl, metolazone, nitroGLYCERIN, ondansetron **OR** ondansetron (ZOFRAN) IV  Assessment/ Plan:  50 y.o. female with end-stage renal disease on hemodialysis, asthma, gastroparesis, diabetes mellitus type II, congestive heart failure, hypertension, endometriosis, coronary atherosclerosis  [facility]/ [address]/ TTS RIJ permcath  1. End-stage renal disease on hemodialysis dialysis, TTHS: patient is in need of hemodialysis today given mild hyperkalemia.  We will proceed with this.  2. Anemia of YNW:[id number]:Hemoglobin low at 7.4.  We will start the patient on Epogen with dialysis..   3. Sepsis: 2 blood cultures are positive.  Awaiting further culture data.  We will likely need to remove PermCath.  We will also need [facility]  4. Secondary Hyperparathyroidism:  Check intact PTH and phosphorus with dialysis.  PTH previouslynoted as being low.  Therefore we will discontinue this.  Continue binder therapy.   LOS: 0 Chord Takahashi 9/26/20172:20 PM

## 2015-12-26 NOTE — Progress Notes (Signed)
Pt remains alert and oriented but slow to respond. Lungs show diminished to ascultation. HD perm cath is loctated on right upper chest, Right abdominal peritoneal cath still intact, dressing is clean, dry and intact. HD stopped early 2/2 to patient request and febrile. 650 mg tylenol given by HD nurse. I will continue to assess.

## 2015-12-26 NOTE — Progress Notes (Signed)
Kimberly Montgomery was trabnsferred from the ER via stretcher, she was accompanied by her daughter. Patient was on 2L of supplemental oxygen, denied chest pain and nausea.  She was oriented to her room and assisted with repositioning Q2 and as needed.   0340: Sublingual nitro was administered twice for a c/o chest pain of 8/10. The nitro was able to decrease the pain from 8/10 to 7/20.Patient's chest pain was later relieved with PRN morphine. She stated that her pain was 3/10 and tolerable. Patient remained hemodynamically stable overnight. VS remained WDL for patient and NSR.

## 2015-12-26 NOTE — Care Management Obs Status (Signed)
MEDICARE OBSERVATION STATUS NOTIFICATION   Patient Details  Name: Kimberly Montgomery MRN: [id number] Date of Birth: [birth year]   Medicare Observation Status Notification Given:  Yes    Eber Hong, RN 12/26/2015, 10:13 AM

## 2015-12-26 NOTE — Progress Notes (Signed)
  End of hd 

## 2015-12-26 NOTE — Progress Notes (Signed)
Pre hd info 

## 2015-12-26 NOTE — Care Management (Signed)
Patient presented to the ED from her skilled nursing facility for chest pain.  She had similar presentation to the ED from her dialysis clinic 24 hours earlier.  There is a bump in her troponins.  Has been assessed by nephology and she is in need of her usual dialysis treatment today- it is medically necessary and should not be missed due to patient history of going into fluid overload very quickly.  She did have 100.5 temp today.  Attending has not rounded as of yet.

## 2015-12-26 NOTE — Progress Notes (Signed)
Pharmacy Antibiotic Follow-up Note  Kimberly Montgomery is a 50 y.o. year-old female admitted on 12/25/2015.   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 Spoke with Dr. Sheryle Hail, start levofloxacin dialysis dose.  Temp (24hrs), Avg:99.6 F (37.6 C), Min:98.8 F (37.1 C), Max:100.5 F (38.1 C)   Recent Labs Lab 12/24/15 0816 12/25/15 1434 12/26/15 0431  WBC 6.6 11.0 11.0    Recent Labs Lab 12/24/15 0816 12/25/15 1434 12/26/15 0431  CREATININE 4.51* 5.65* 6.05*   Estimated Creatinine Clearance: 15.2 mL/min (by C-G formula based on SCr of 6.05 mg/dL (H)).    Allergies  Allergen Reactions  . Cephalosporins Anaphylaxis  . Penicillins Anaphylaxis and Other (See Comments)    Has patient had a PCN reaction causing immediate rash, facial/tongue/throat swelling, SOB or lightheadedness with hypotension: Yes Has patient had a PCN reaction causing severe rash involving mucus membranes or skin necrosis: No Has patient had a PCN reaction that required hospitalization No Has patient had a PCN reaction occurring within the last 10 years: No If all of the above answers are "NO", then may proceed with Cephalosporin use.  . Lamictal [Lamotrigine] Other (See Comments)    Reaction:  Hallucinations  . Phenergan [Promethazine Hcl] Nausea And Vomiting  . Pravastatin Other (See Comments)    Reaction:  Muscle pain   . Sulfa Antibiotics Other (See Comments)    Reaction:  Unknown     Thank you for allowing pharmacy to be a part of this patient's care.  Carola Frost, Pharm.D., BCPS Clinical Pharmacist 12/26/2015 7:04 AM

## 2015-12-27 DIAGNOSIS — E1122 Type 2 diabetes mellitus with diabetic chronic kidney disease: Secondary | ICD-10-CM | POA: Diagnosis present

## 2015-12-27 DIAGNOSIS — G40909 Epilepsy, unspecified, not intractable, without status epilepticus: Secondary | ICD-10-CM | POA: Diagnosis present

## 2015-12-27 DIAGNOSIS — I739 Peripheral vascular disease, unspecified: Secondary | ICD-10-CM | POA: Diagnosis present

## 2015-12-27 DIAGNOSIS — E1143 Type 2 diabetes mellitus with diabetic autonomic (poly)neuropathy: Secondary | ICD-10-CM | POA: Diagnosis present

## 2015-12-27 DIAGNOSIS — E785 Hyperlipidemia, unspecified: Secondary | ICD-10-CM | POA: Diagnosis present

## 2015-12-27 DIAGNOSIS — N186 End stage renal disease: Secondary | ICD-10-CM | POA: Diagnosis not present

## 2015-12-27 DIAGNOSIS — M542 Cervicalgia: Secondary | ICD-10-CM | POA: Diagnosis present

## 2015-12-27 DIAGNOSIS — N2581 Secondary hyperparathyroidism of renal origin: Secondary | ICD-10-CM | POA: Diagnosis present

## 2015-12-27 DIAGNOSIS — I214 Non-ST elevation (NSTEMI) myocardial infarction: Secondary | ICD-10-CM | POA: Diagnosis present

## 2015-12-27 DIAGNOSIS — I2510 Atherosclerotic heart disease of native coronary artery without angina pectoris: Secondary | ICD-10-CM | POA: Diagnosis present

## 2015-12-27 DIAGNOSIS — R7881 Bacteremia: Secondary | ICD-10-CM | POA: Diagnosis present

## 2015-12-27 DIAGNOSIS — J45909 Unspecified asthma, uncomplicated: Secondary | ICD-10-CM | POA: Diagnosis present

## 2015-12-27 DIAGNOSIS — I248 Other forms of acute ischemic heart disease: Secondary | ICD-10-CM | POA: Diagnosis present

## 2015-12-27 DIAGNOSIS — K3184 Gastroparesis: Secondary | ICD-10-CM | POA: Diagnosis present

## 2015-12-27 DIAGNOSIS — D631 Anemia in chronic kidney disease: Secondary | ICD-10-CM | POA: Diagnosis present

## 2015-12-27 DIAGNOSIS — B957 Other staphylococcus as the cause of diseases classified elsewhere: Secondary | ICD-10-CM | POA: Diagnosis present

## 2015-12-27 DIAGNOSIS — A419 Sepsis, unspecified organism: Secondary | ICD-10-CM | POA: Diagnosis present

## 2015-12-27 DIAGNOSIS — B952 Enterococcus as the cause of diseases classified elsewhere: Secondary | ICD-10-CM | POA: Diagnosis present

## 2015-12-27 DIAGNOSIS — E1151 Type 2 diabetes mellitus with diabetic peripheral angiopathy without gangrene: Secondary | ICD-10-CM | POA: Diagnosis present

## 2015-12-27 DIAGNOSIS — K5900 Constipation, unspecified: Secondary | ICD-10-CM | POA: Diagnosis present

## 2015-12-27 DIAGNOSIS — K219 Gastro-esophageal reflux disease without esophagitis: Secondary | ICD-10-CM | POA: Diagnosis present

## 2015-12-27 DIAGNOSIS — E875 Hyperkalemia: Secondary | ICD-10-CM | POA: Diagnosis present

## 2015-12-27 DIAGNOSIS — I5032 Chronic diastolic (congestive) heart failure: Secondary | ICD-10-CM | POA: Diagnosis present

## 2015-12-27 DIAGNOSIS — I132 Hypertensive heart and chronic kidney disease with heart failure and with stage 5 chronic kidney disease, or end stage renal disease: Secondary | ICD-10-CM | POA: Diagnosis present

## 2015-12-27 DIAGNOSIS — A4151 Sepsis due to Escherichia coli [E. coli]: Secondary | ICD-10-CM | POA: Diagnosis present

## 2015-12-27 DIAGNOSIS — E871 Hypo-osmolality and hyponatremia: Secondary | ICD-10-CM | POA: Diagnosis present

## 2015-12-27 LAB — GLUCOSE, CAPILLARY
GLUCOSE-CAPILLARY: 113 mg/dL — AB (ref 65–99)
GLUCOSE-CAPILLARY: 142 mg/dL — AB (ref 65–99)
GLUCOSE-CAPILLARY: 159 mg/dL — AB (ref 65–99)
GLUCOSE-CAPILLARY: 159 mg/dL — AB (ref 65–99)

## 2015-12-27 LAB — CBC
HCT: 21.4 % — ABNORMAL LOW (ref 35.0–47.0)
Hemoglobin: 7.1 g/dL — ABNORMAL LOW (ref 12.0–16.0)
MCH: 31.3 pg (ref 26.0–34.0)
MCHC: 33.1 g/dL (ref 32.0–36.0)
MCV: 94.3 fL (ref 80.0–100.0)
PLATELETS: 200 10*3/uL (ref 150–440)
RBC: 2.27 MIL/uL — ABNORMAL LOW (ref 3.80–5.20)
RDW: 14.1 % (ref 11.5–14.5)
WBC: 6.3 10*3/uL (ref 3.6–11.0)

## 2015-12-27 LAB — BODY FLUID CELL COUNT WITH DIFFERENTIAL
EOS FL: 0 %
LYMPHS FL: 30 %
MONOCYTE-MACROPHAGE-SEROUS FLUID: 20 %
Neutrophil Count, Fluid: 50 %
Total Nucleated Cell Count, Fluid: 30 cu mm

## 2015-12-27 LAB — GRAM STAIN

## 2015-12-27 LAB — HEPATITIS B SURFACE ANTIGEN: HEP B S AG: NEGATIVE

## 2015-12-27 MED ORDER — FLEET ENEMA 7-19 GM/118ML RE ENEM
1.0000 | ENEMA | Freq: Once | RECTAL | Status: AC
  Administered 2015-12-27: 1 via RECTAL

## 2015-12-27 NOTE — Procedures (Signed)
PD manual exchange to collect PD fluid, delivered myself to lab.

## 2015-12-27 NOTE — Consult Note (Signed)
[facility] Vascular Consult Note  MRN : [id number]  Kimberly Montgomery is a 50 y.o. (Apr 01, 1966) female who presents with chief complaint of  Chief Complaint  Patient presents with  . Chest Pain   History of Present Illness: The patient is a 50 year female with a PMHx end-stage renal disease on hemodialysis, hypertension, hyperlipidemia, peripheral vascular disease, diabetes and history of coronary artery disease. Patient resides in a skilled nursing facility. The patient is known to our practice:  08-25-15: Insertion Right IJ permcath (Dew) 11/03/13: Peritoneal Dialysis Catheter Placement (Schnier)  Patient endorses a history of being admitted through [facility] ED with chest pain. States the chest pain was present "for a few days". Described the patient as being located in the center of her chest radiating to her neck. She denied any associated nausea, vomiting, diaphoresis, palpitations or syncope. During her ED workup she was found to mildly elevated troponin's and admitted for observation. Cardiology consulted and following.   During her admission she was found to be septic growing blood cultures positive for enterococcus and Escherichia coli.   Patient has been dialyzing from her permcath. States her PD catheter was recently flushed and seemed to work.   Vascular surgery was consulted by nephrology, Dr. Cherylann Ratel for removal of her permcath as this maybe the source of her sepsis.   Current Facility-Administered Medications  Medication Dose Route Frequency Provider Last Rate Last Dose  . acetaminophen (TYLENOL) tablet 650 mg  650 mg Oral Q6H PRN Houston Siren, MD   650 mg at 12/27/15 4696   Or  . acetaminophen (TYLENOL) suppository 650 mg  650 mg Rectal Q6H PRN Houston Siren, MD      . bisacodyl (DULCOLAX) suppository 10 mg  10 mg Rectal Daily PRN Houston Siren, MD      . calcium acetate (PHOSLO) capsule 2,001 mg  2,001 mg Oral TID WC Houston Siren, MD   2,001 mg  at 12/27/15 1657  . calcium carbonate (TUMS - dosed in mg elemental calcium) chewable tablet 200 mg of elemental calcium  1 tablet Oral TID WC Houston Siren, MD   200 mg of elemental calcium at 12/27/15 1657  . clopidogrel (PLAVIX) tablet 75 mg  75 mg Oral Daily Houston Siren, MD   75 mg at 12/27/15 0906  . colesevelam [facility]) tablet 625 mg  625 mg Oral BID WC Houston Siren, MD   625 mg at 12/27/15 1657  . docusate sodium (COLACE) capsule 100 mg  100 mg Oral QHS Houston Siren, MD   100 mg at 12/26/15 2106  . feeding supplement (PRO-STAT SUGAR FREE 64) liquid 30 mL  30 mL Oral TID WC Houston Siren, MD   30 mL at 12/27/15 1656  . heparin injection 5,000 Units  5,000 Units Subcutaneous Q8H Alexis Hugelmeyer, DO   5,000 Units at 12/27/15 1443  . hydrALAZINE (APRESOLINE) tablet 25 mg  25 mg Oral Daily Houston Siren, MD   25 mg at 12/27/15 0920  . insulin aspart (novoLOG) injection 0-10 Units  0-10 Units Subcutaneous TID WC Houston Siren, MD   2 Units at 12/27/15 1714  . insulin glargine (LANTUS) injection 8 Units  8 Units Subcutaneous QHS Houston Siren, MD   8 Units at 12/26/15 2107  . isosorbide mononitrate (IMDUR) 24 hr tablet 60 mg  60 mg Oral Daily Houston Siren, MD   60 mg at 12/27/15 0908  . lactulose (  CHRONULAC) 10 GM/15ML solution 10 g  10 g Oral Daily Houston Siren, MD   10 g at 12/27/15 0908  . levETIRAcetam (KEPPRA) tablet 500 mg  500 mg Oral q morning - 10a Houston Siren, MD   500 mg at 12/27/15 0907  . levofloxacin (LEVAQUIN) IVPB 500 mg  500 mg Intravenous Q48H Arnaldo Natal, MD      . lisinopril (PRINIVIL,ZESTRIL) tablet 40 mg  40 mg Oral Daily Houston Siren, MD   40 mg at 12/27/15 0905  . metolazone (ZAROXOLYN) tablet 5 mg  5 mg Oral Daily PRN Houston Siren, MD      . metoprolol (LOPRESSOR) tablet 50 mg  50 mg Oral BID Houston Siren, MD   50 mg at 12/27/15 0905  . mupirocin ointment (BACTROBAN) 2 % 1 application  1 application Nasal BID Houston Siren,  MD   1 application at 12/27/15 0911  . nicotine (NICODERM CQ - dosed in mg/24 hours) patch 14 mg  14 mg Transdermal Daily Houston Siren, MD   14 mg at 12/27/15 0909  . nitroGLYCERIN (NITROSTAT) SL tablet 0.4 mg  0.4 mg Sublingual Q5 min PRN Houston Siren, MD   0.4 mg at 12/26/15 0345  . nystatin (MYCOSTATIN/NYSTOP) topical powder 1 g  1 g Topical BID Houston Siren, MD   1 g at 12/27/15 1059  . ondansetron (ZOFRAN) tablet 4 mg  4 mg Oral Q6H PRN Houston Siren, MD       Or  . ondansetron (ZOFRAN) injection 4 mg  4 mg Intravenous Q6H PRN Houston Siren, MD      . pantoprazole (PROTONIX) EC tablet 40 mg  40 mg Oral Daily Houston Siren, MD   40 mg at 12/27/15 0921  . polyethylene glycol (MIRALAX / GLYCOLAX) packet 17 g  17 g Oral Daily Houston Siren, MD   17 g at 12/27/15 0908  . ranolazine (RANEXA) 12 hr tablet 500 mg  500 mg Oral BID Houston Siren, MD   500 mg at 12/27/15 0906  . risperiDONE (RISPERDAL) tablet 1 mg  1 mg Oral TID Houston Siren, MD   1 mg at 12/27/15 1657  . senna (SENOKOT) tablet 17.2 mg  2 tablet Oral BID Houston Siren, MD   17.2 mg at 12/27/15 0905  . sodium chloride flush (NS) 0.9 % injection 3 mL  3 mL Intravenous Q12H Houston Siren, MD   3 mL at 12/27/15 1000  . torsemide (DEMADEX) tablet 100 mg  100 mg Oral Daily Houston Siren, MD   100 mg at 12/27/15 0906  . white petrolatum (VASELINE) gel   Topical Q72H Houston Siren, MD       Past Medical History:  Diagnosis Date  . Asthma   . CAD (coronary artery disease)   . CHF (congestive heart failure) (HCC)   . Diabetes mellitus without complication (HCC)   . Endometriosis   . ESRD (end stage renal disease) (HCC)   . Gastroparesis   . GERD (gastroesophageal reflux disease)   . HLD (hyperlipidemia)   . Hypertension   . Renal disorder    Past Surgical History:  Procedure Laterality Date  . BELOW KNEE LEG AMPUTATION    . CARDIAC CATHETERIZATION Right 07/10/2015   Procedure: Left Heart Cath and  Coronary Angiography;  Surgeon: Laurier Nancy, MD;  Location: [facility] INVASIVE CV LAB;  Service: Cardiovascular;  Laterality: Right;  .  CARDIAC CATHETERIZATION Right 09/14/2015   Procedure: Left Heart Cath and Coronary Angiography;  Surgeon: Laurier Nancy, MD;  Location: [facility] INVASIVE CV LAB;  Service: Cardiovascular;  Laterality: Right;  . CARDIAC CATHETERIZATION N/A 09/14/2015   Procedure: Coronary Stent Intervention;  Surgeon: Alwyn Pea, MD;  Location: [facility] INVASIVE CV LAB;  Service: Cardiovascular;  Laterality: N/A;  . CARDIAC CATHETERIZATION Right 11/20/2015   Procedure: Left Heart Cath and Coronary Angiography;  Surgeon: Laurier Nancy, MD;  Location: [facility] INVASIVE CV LAB;  Service: Cardiovascular;  Laterality: Right;  . hd fistula surgery with reversal    . HYSTEROTOMY    . pd tube inserstion    . PERIPHERAL VASCULAR CATHETERIZATION N/A 08/30/2015   Procedure: Dialysis/Perma Catheter Insertion;  Surgeon: Annice Needy, MD;  Location: [facility] INVASIVE CV LAB;  Service: Cardiovascular;  Laterality: N/A;   Social History Social History  Substance Use Topics  . Smoking status: Former Smoker    Packs/day: 0.50    Types: Cigarettes  . Smokeless tobacco: Never Used  . Alcohol use No   Family History Family History  Problem Relation Age of Onset  . CAD    . Diabetes    . Bipolar disorder    . Cervical cancer Mother   Patient denies family history of PAD, venous disease or renal disease.   Allergies  Allergen Reactions  . Cephalosporins Anaphylaxis  . Penicillins Anaphylaxis and Other (See Comments)    Has patient had a PCN reaction causing immediate rash, facial/tongue/throat swelling, SOB or lightheadedness with hypotension: Yes Has patient had a PCN reaction causing severe rash involving mucus membranes or skin necrosis: No Has patient had a PCN reaction that required hospitalization No Has patient had a PCN reaction occurring within the last 10 years: No If all of the above  answers are "NO", then may proceed with Cephalosporin use.  . Lamictal [Lamotrigine] Other (See Comments)    Reaction:  Hallucinations  . Phenergan [Promethazine Hcl] Nausea And Vomiting  . Pravastatin Other (See Comments)    Reaction:  Muscle pain   . Sulfa Antibiotics Other (See Comments)    Reaction:  Unknown    REVIEW OF SYSTEMS (Negative unless checked)  Constitutional: [] Weight loss  [] Fever  [] Chills Cardiac: [x] Chest pain   [x] Chest pressure   [] Palpitations   [] Shortness of breath when laying flat   [] Shortness of breath at rest   [] Shortness of breath with exertion. Vascular:  [] Pain in legs with walking   [] Pain in legs at rest   [] Pain in legs when laying flat   [] Claudication   [] Pain in feet when walking  [] Pain in feet at rest  [] Pain in feet when laying flat   [] History of DVT   [] Phlebitis   [x] Swelling in legs   [] Varicose veins   [] Non-healing ulcers Pulmonary:   [] Uses home oxygen   [] Productive cough   [] Hemoptysis   [] Wheeze  [] COPD   [] Asthma Neurologic:  [] Dizziness  [] Blackouts   [] Seizures   [] History of stroke   [] History of TIA  [] Aphasia   [] Temporary blindness   [] Dysphagia   [] Weakness or numbness in arms   [] Weakness or numbness in legs Musculoskeletal:  [] Arthritis   [] Joint swelling   [] Joint pain   [] Low back pain Hematologic:  [] Easy bruising  [] Easy bleeding   [] Hypercoagulable state   [] Anemic  [] Hepatitis Gastrointestinal:  [] Blood in stool   [] Vomiting blood  [] Gastroesophageal reflux/heartburn   [] Difficulty swallowing. Genitourinary:  [x] Chronic kidney disease   []   Difficult urination  [] Frequent urination  [] Burning with urination   [] Blood in urine Skin:  [] Rashes   [] Ulcers   [] Wounds Psychological:  [] History of anxiety   []  History of major depression.  Physical Examination  Vitals:   12/26/15 1943 12/27/15 0518 12/27/15 0800 12/27/15 1124  BP: (!) 140/57 (!) 144/64 (!) 146/66 (!) 162/69  Pulse: 90 73 76 71  Resp: 18 18 18 20   Temp: 99.8 F  (37.7 C) 98.4 F (36.9 C) 98.6 F (37 C) 97.9 F (36.6 C)  TempSrc: Oral Oral Oral Oral  SpO2: 98% 100% 100% 99%  Weight:  117.9 kg (259 lb 14.4 oz)    Height:       Body mass index is 37.29 kg/m. Gen:  WD/WN, NAD Head: [place]/AT, No temporalis wasting. Prominent temp pulse not noted. Ear/Nose/Throat: Hearing grossly intact, nares w/o erythema or drainage, oropharynx w/o Erythema/Exudate Eyes: PERRLA, EOMI.  Neck: Supple, no nuchal rigidity.  No bruit or JVD.  Right Permcath: Intact, clean and dry. No infection noted.  Pulmonary:  Good air movement, clear to auscultation bilaterally.  Cardiac: RRR, normal S1, S2, no Murmurs, rubs or gallops. Vascular:  Vessel Right Left  Radial Palpable Palpable  Ulnar Palpable Palpable  Brachial Palpable Palpable  Carotid Palpable, without bruit Palpable, without bruit  Aorta Not palpable N/A  Femoral Palpable Palpable  Popliteal Palpable Palpable  PT Palpable Palpable  DP Palpable Palpable   Gastrointestinal: soft, non-tender/non-distended. No guarding/reflex. No masses, surgical incisions, or scars. Peritoneal Dialysis Catheter: Intact, clean and dry. No infection noted.  Musculoskeletal: M/S 5/5 throughout.  Extremities without ischemic changes.  No deformity or atrophy. No edema. Neurologic: CN 2-12 intact. Pain and light touch intact in extremities.  Symmetrical.  Speech is fluent. Motor exam as listed above. Psychiatric: Judgment intact, Mood & affect appropriate for pt's clinical situation. Dermatologic: No rashes or ulcers noted.  No cellulitis or open wounds. Lymph : No Cervical, Axillary, or Inguinal lymphadenopathy.  CBC Lab Results  Component Value Date   WBC 6.3 12/27/2015   HGB 7.1 (L) 12/27/2015   HCT 21.4 (L) 12/27/2015   MCV 94.3 12/27/2015   PLT 200 12/27/2015   BMET    Component Value Date/Time   NA 131 (L) 12/26/2015 0431   NA 136 07/17/2014 0417   K 5.2 (H) 12/26/2015 0431   K 3.5 07/17/2014 0417   CL 98 (L)  12/26/2015 0431   CL 103 07/17/2014 0417   CO2 26 12/26/2015 0431   CO2 26 07/17/2014 0417   GLUCOSE 186 (H) 12/26/2015 0431   GLUCOSE 221 (H) 07/17/2014 0417   BUN 68 (H) 12/26/2015 0431   BUN 32 (H) 07/17/2014 0417   CREATININE 6.05 (H) 12/26/2015 0431   CREATININE 3.45 (H) 07/17/2014 0417   CALCIUM 9.1 12/26/2015 0431   CALCIUM 8.4 (L) 07/17/2014 0417   GFRNONAA 7 (L) 12/26/2015 0431   GFRNONAA 15 (L) 07/17/2014 0417   GFRAA 8 (L) 12/26/2015 0431   GFRAA 17 (L) 07/17/2014 0417   Estimated Creatinine Clearance: 15.2 mL/min (by C-G formula based on SCr of 6.05 mg/dL (H)).  COAG Lab Results  Component Value Date   INR 1.03 12/25/2015   INR 0.96 11/19/2015   INR 0.93 10/05/2015   Radiology Dg Chest 1 View  Result Date: 12/07/2015 CLINICAL DATA:  Chest pain during dialysis EXAM: CHEST 1 VIEW COMPARISON:  12/05/2015 FINDINGS: Cardiac shadow is enlarged but stable from the prior exam. A right jugular dialysis catheter is again  seen and stable. The lungs are clear bilaterally. No acute bony abnormality is seen. IMPRESSION: No active disease. Electronically Signed   By: Alcide Clever M.D.   On: 12/07/2015 08:27   Dg Chest 2 View  Result Date: 12/24/2015 CLINICAL DATA:  Chest pain this morning. Previous coronary artery stent placement. EXAM: CHEST  2 VIEW COMPARISON:  12/07/2015. FINDINGS: Stable enlarged cardiac silhouette. The right jugular catheter tip remains in the superior vena cava. The interstitial markings are minimally prominent. Otherwise, clear lungs. Small to moderate-sized pleural effusion posteriorly, most likely on the left. Aortic arch calcification. Mild thoracic spine degenerative changes. Minimal bilateral shoulder degenerative changes. IMPRESSION: 1. Small to moderate size left pleural effusion posteriorly at the lung bases. 2. Mild aortic atherosclerosis. 3. Minimal chronic interstitial lung disease. Electronically Signed   By: Beckie Salts M.D.   On: 12/24/2015 08:45    Dg Chest 2 View  Result Date: 12/05/2015 CLINICAL DATA:  50 year old female with chest pain concerning for pneumonia. EXAM: CHEST  2 VIEW COMPARISON:  Chest radiograph dated 11/20/2015 FINDINGS: There is a small left pleural effusion, decreased in size compared to prior study. There is associated mild compressive atelectasis of the left lung base. Superimposed pneumonia is not excluded. Clinical correlation is recommended. The right lung is clear. There is no pneumothorax. Right IJ dialysis catheter remains in stable positioning. There is stable mild cardiomegaly. No acute osseous pathology. IMPRESSION: Interval decrease in the size of the left pleural effusion. Stable mild cardiomegaly. Electronically Signed   By: Elgie Collard M.D.   On: 12/05/2015 18:52   US Venous Img Upper Uni Left  Result Date: 12/07/2015 CLINICAL DATA:  Left arm edema for 3 days EXAM: LEFT UPPER EXTREMITY VENOUS DUPLEX ULTRASOUND TECHNIQUE: Doppler venous assessment of the left lower extremity deep venous system was performed, including characterization of spectral flow, compressibility, and phasicity. COMPARISON:  None. FINDINGS: There is complete compressibility of the left jugular, subclavian, axillary, and brachial veins. Radial vein is compressible. Ulnar vein is poorly visualized but grossly compressible. There is no obvious cephalic or basilic vein thrombosis. Doppler analysis demonstrates a normal-appearing venous waveform. There is also augmentation with wrist compression. IMPRESSION: No evidence of left upper extremity DVT. No evidence of left jugular vein DVT. Electronically Signed   By: Jolaine Click M.D.   On: 12/07/2015 09:28   Dg Chest Portable 1 View  Result Date: 12/25/2015 CLINICAL DATA:  Chest pain since last night. EXAM: PORTABLE CHEST 1 VIEW COMPARISON:  December 24, 2015 FINDINGS: The heart size and mediastinal contours are stable. Right central venous sinus unchanged distal tip in superior vena cava.  There is no focal infiltrate, pulmonary edema, or pleural effusion. The visualized skeletal structures are stable. IMPRESSION: No active cardiopulmonary disease. Electronically Signed   By: Sherian Rein M.D.   On: 12/25/2015 15:54   Assessment/Plan The patient is a 50 year old female with  PMHx end-stage renal disease on hemodialysis, hypertension, hyperlipidemia, peripheral vascular disease, diabetes and history of coronary artery disease admitted with chest pain found to be septic with growing blood cultures positive for enterococcus and Escherichia coli.  1) Permcath: Will remove as this may be source of sepsis. Procedure, risks and benefits explained to patient. All questions answered. Patient wishes to proceed. 2) ESRD: Can try PD for dialysis - if doesn't work patient with need another permcath / temp dialysis catheter in order to dialyze. Patient refusing fistula placement in arm at this time.  3) Discussed with Dr.  Schnier  Tonette LedererKIMBERLY A Leanna Hamid, PA-C  12/27/2015 6:58 PM

## 2015-12-27 NOTE — Clinical Social Work Note (Signed)
MSW received consult that patient is from [facility], however patient reported that she has used up her [facility] Medicare days and has applied for long term care Medicaid.  Patient expressed that she would like to go to a different SNF if possible as a long term care resident.  Patient expressed that she understands that she has high medical needs which is part of the reason she is not able to go to her daughter's house.  Patient stated she gets disability and social security.  Patient was explained that even though she has [facility] and Medicaid there will be limited amount of choices for long term care beds, but she stated she understands.  Patient was agreeable to have MSW begin bed search process for a SNF that can accept her as a long term care resident.  Patient has a [employer] number which expires on 03-24-16, her number is [id number] F.  MSW to continue to assist with long term care bed placement search.  Ervin Knack. Hassan Rowan, MSW [phone]  Mon-Fri 8a-4:30p 12/27/2015 6:25 PM

## 2015-12-27 NOTE — Care Management (Signed)
Patient was in tears while talking with Chaplain earlier today. The patient is worried that as her insurance expires she will be sent to a nursing home. She does not desire this placement and seems exasperate due to her continued illness and the thought of placement. She seems to feel overwhelmed by her inability to control the situation and have a say so that she feels is best for her. The Chaplain will continue to look in on her and provide counseling and anything else she needs to encourage her.

## 2015-12-27 NOTE — Progress Notes (Signed)
[facility]  ROUNDING NOTE   Subjective:  Patient cut her hemodialysis treatment short yesterday. She has positive blood cultures now showing Escherichia coli as well as Enterobacter. Vascular surgery consult has been placed to remove at least the PermCath.  Objective:  Vital signs in last 24 hours:  Temp:  [97.9 F (36.6 C)-99.8 F (37.7 C)] 97.9 F (36.6 C) (09/27 1124) Pulse Rate:  [70-100] 71 (09/27 1124) Resp:  [15-20] 20 (09/27 1124) BP: (129-170)/(57-80) 162/69 (09/27 1124) SpO2:  [98 %-100 %] 99 % (09/27 1124) Weight:  [117.9 kg (259 lb 14.4 oz)-118.6 kg (261 lb 7.5 oz)] 117.9 kg (259 lb 14.4 oz) (09/27 0518)  Weight change: -0.696 kg (-1 lb 8.6 oz) Filed Weights   12/26/15 1513 12/26/15 1705 12/27/15 0518  Weight: 118.6 kg (261 lb 7.5 oz) 118.3 kg (260 lb 12.9 oz) 117.9 kg (259 lb 14.4 oz)    Intake/Output: I/O last 3 completed shifts: In: 136.2 [I.V.:136.2] Out: 700 [Other:700]   Intake/Output this shift:  No intake/output data recorded.  Physical Exam: General: No acute distress  Head: Normocephalic, atraumatic. Moist oral mucosal membranes  Eyes: Anicteric  Neck: Supple, trachea midline  Lungs:  Clear to auscultation, normal effort  Heart: S1S2 no rubs  Abdomen:  Soft, nontender, BS present, PD catheter in place  Extremities: RLE amputation noted  Neurologic: Nonfocal, moving all four extremities  Skin: No lesions  Access: R IJ PC, PD cath also in place    Basic Metabolic Panel:  Recent Labs Lab 12/24/15 0816 12/25/15 1434 12/26/15 0431  NA 132* 131* 131*  K 4.2 5.0 5.2*  CL 97* 96* 98*  CO2 30 28 26   GLUCOSE 115* 132* 186*  BUN 45* 64* 68*  CREATININE 4.51* 5.65* 6.05*  CALCIUM 9.2 9.3 9.1    Liver Function Tests:  Recent Labs Lab 12/25/15 1434  AST 17  ALT 9*  ALKPHOS 66  BILITOT 0.5  PROT 5.0*  ALBUMIN 2.2*   No results for input(s): LIPASE, AMYLASE in the last 168 hours. No results for input(s): AMMONIA in the  last 168 hours.  CBC:  Recent Labs Lab 12/24/15 0816 12/25/15 1434 12/26/15 0431 12/27/15 1347  WBC 6.6 11.0 11.0 6.3  NEUTROABS  --  9.6*  --   --   HGB 9.6* 7.7* 7.4* 7.1*  HCT 28.1* 23.5* 22.5* 21.4*  MCV 94.3 95.2 96.4 94.3  PLT 299 226 207 200    Cardiac Enzymes:  Recent Labs Lab 12/24/15 1206 12/25/15 1434 12/25/15 2036 12/26/15 0120 12/26/15 0431  TROPONINI 0.03* 0.66* 1.01* 0.63* 0.54*    BNP: Invalid input(s): POCBNP  CBG:  Recent Labs Lab 12/26/15 1144 12/26/15 1718 12/26/15 2034 12/27/15 0821 12/27/15 1124  GLUCAP 163* 134* 198* 113* 142*    Microbiology: Results for orders placed or performed during the hospital encounter of 12/25/15  Blood culture (routine x 2)     Status: None (Preliminary result)   Collection Time: 12/25/15  2:32 PM  Result Value Ref Range Status   Specimen Description BLOOD  L HAND  Final   Special Requests   Final    BOTTLES DRAWN AEROBIC AND ANAEROBIC  AER 12 ML ANA 14 ML   Culture  Setup Time   Final    GRAM NEGATIVE RODS IN BOTH AEROBIC AND ANAEROBIC BOTTLES CRITICAL VALUE NOTED.  VALUE IS CONSISTENT WITH PREVIOUSLY REPORTED AND CALLED VALUE. SDR    Culture   Final    GRAM NEGATIVE RODS IDENTIFICATION AND  SUSCEPTIBILITIES TO FOLLOW Performed at [facility]    Report Status PENDING  Incomplete  Blood culture (routine x 2)     Status: Abnormal (Preliminary result)   Collection Time: 12/25/15  2:32 PM  Result Value Ref Range Status   Specimen Description BLOOD  L FA  Final   Special Requests   Final    BOTTLES DRAWN AEROBIC AND ANAEROBIC  AER 12 ML ANA 12 ML   Culture  Setup Time   Final    GRAM NEGATIVE RODS IN BOTH AEROBIC AND ANAEROBIC BOTTLES CRITICAL RESULT CALLED TO, READ BACK BY AND VERIFIED WITH: NATE COOKSON AT 0535 12/26/15 SDR    Culture (A)  Final    ESCHERICHIA COLI SUSCEPTIBILITIES TO FOLLOW Performed at [facility]    Report Status PENDING  Incomplete  Blood Culture ID  Panel (Reflexed)     Status: Abnormal   Collection Time: 12/25/15  2:32 PM  Result Value Ref Range Status   Enterococcus species NOT DETECTED NOT DETECTED Final   Listeria monocytogenes NOT DETECTED NOT DETECTED Final   Staphylococcus species NOT DETECTED NOT DETECTED Final   Staphylococcus aureus NOT DETECTED NOT DETECTED Final   Streptococcus species NOT DETECTED NOT DETECTED Final   Streptococcus agalactiae NOT DETECTED NOT DETECTED Final   Streptococcus pneumoniae NOT DETECTED NOT DETECTED Final   Streptococcus pyogenes NOT DETECTED NOT DETECTED Final   Acinetobacter baumannii NOT DETECTED NOT DETECTED Final   Enterobacteriaceae species DETECTED (A) NOT DETECTED Final    Comment: CRITICAL RESULT CALLED TO, READ BACK BY AND VERIFIED WITH:  NATE COOKSON AT 0535 12/26/15 SDR    Enterobacter cloacae complex NOT DETECTED NOT DETECTED Final   Escherichia coli DETECTED (A) NOT DETECTED Final    Comment: CRITICAL RESULT CALLED TO, READ BACK BY AND VERIFIED WITH:  NATE COOKSON AT 0535 12/26/15 SDR    Klebsiella oxytoca NOT DETECTED NOT DETECTED Final   Klebsiella pneumoniae NOT DETECTED NOT DETECTED Final   Proteus species NOT DETECTED NOT DETECTED Final   Serratia marcescens NOT DETECTED NOT DETECTED Final   Carbapenem resistance NOT DETECTED NOT DETECTED Final   Haemophilus influenzae NOT DETECTED NOT DETECTED Final   Neisseria meningitidis NOT DETECTED NOT DETECTED Final   Pseudomonas aeruginosa NOT DETECTED NOT DETECTED Final   Candida albicans NOT DETECTED NOT DETECTED Final   Candida glabrata NOT DETECTED NOT DETECTED Final   Candida krusei NOT DETECTED NOT DETECTED Final   Candida parapsilosis NOT DETECTED NOT DETECTED Final   Candida tropicalis NOT DETECTED NOT DETECTED Final  Culture, body fluid-bottle     Status: None (Preliminary result)   Collection Time: 12/25/15  4:00 PM  Result Value Ref Range Status   Specimen Description FLUID PERITONEAL DIALYSATE  Final   Special  Requests BOTTLES DRAWN AEROBIC AND ANAEROBIC 10CC  Final   Culture   Final    NO GROWTH < 24 HOURS Performed at [facility]    Report Status PENDING  Incomplete  Gram stain     Status: None (Preliminary result)   Collection Time: 12/25/15  4:00 PM  Result Value Ref Range Status   Specimen Description FLUID PERITONEAL DIALYSATE  Final   Special Requests NONE  Final   Gram Stain   Final    FEW WBC PRESENT,BOTH PMN AND MONONUCLEAR NO ORGANISMS SEEN Performed at [facility]    Report Status PENDING  Incomplete  MRSA PCR Screening     Status: Abnormal   Collection Time:  12/26/15  1:34 PM  Result Value Ref Range Status   MRSA by PCR POSITIVE (A) NEGATIVE Final    Comment:        The GeneXpert MRSA Assay (FDA approved for NASAL specimens only), is one component of a comprehensive MRSA colonization surveillance program. It is not intended to diagnose MRSA infection nor to guide or monitor treatment for MRSA infections. RESULT CALLED TO, READ BACK BY AND VERIFIED WITH: AMY TODD RN AT 1455 12/26/15 MSS.     Coagulation Studies:  Recent Labs  12/25/15 1630  LABPROT 13.5  INR 1.03    Urinalysis:  Recent Labs  12/25/15 1432  COLORURINE YELLOW*  LABSPEC 1.019  PHURINE 5.0  GLUCOSEU 150*  HGBUR NEGATIVE  BILIRUBINUR NEGATIVE  KETONESUR NEGATIVE  PROTEINUR >500*  NITRITE NEGATIVE  LEUKOCYTESUR NEGATIVE      Imaging: Dg Chest Portable 1 View  Result Date: 12/25/2015 CLINICAL DATA:  Chest pain since last night. EXAM: PORTABLE CHEST 1 VIEW COMPARISON:  December 24, 2015 FINDINGS: The heart size and mediastinal contours are stable. Right central venous sinus unchanged distal tip in superior vena cava. There is no focal infiltrate, pulmonary edema, or pleural effusion. The visualized skeletal structures are stable. IMPRESSION: No active cardiopulmonary disease. Electronically Signed   By: Sherian Rein M.D.   On: 12/25/2015 15:54     Medications:      . calcium acetate  2,001 mg Oral TID WC  . calcium carbonate  1 tablet Oral TID WC  . clopidogrel  75 mg Oral Daily  . colesevelam  625 mg Oral BID WC  . docusate sodium  100 mg Oral QHS  . feeding supplement (PRO-STAT SUGAR FREE 64)  30 mL Oral TID WC  . heparin subcutaneous  5,000 Units Subcutaneous Q8H  . hydrALAZINE  25 mg Oral Daily  . insulin aspart  0-10 Units Subcutaneous TID WC  . insulin glargine  8 Units Subcutaneous QHS  . isosorbide mononitrate  60 mg Oral Daily  . lactulose  10 g Oral Daily  . levETIRAcetam  500 mg Oral q morning - 10a  . levofloxacin (LEVAQUIN) IV  500 mg Intravenous Q48H  . lisinopril  40 mg Oral Daily  . metoprolol  50 mg Oral BID  . mupirocin ointment  1 application Nasal BID  . nicotine  14 mg Transdermal Daily  . nystatin  1 g Topical BID  . pantoprazole  40 mg Oral Daily  . polyethylene glycol  17 g Oral Daily  . ranolazine  500 mg Oral BID  . risperiDONE  1 mg Oral TID  . senna  2 tablet Oral BID  . sodium chloride flush  3 mL Intravenous Q12H  . sodium phosphate  1 enema Rectal Once  . torsemide  100 mg Oral Daily  . white petrolatum   Topical Q72H   acetaminophen **OR** acetaminophen, bisacodyl, metolazone, nitroGLYCERIN, ondansetron **OR** ondansetron (ZOFRAN) IV  Assessment/ Plan:  50 y.o. female with end-stage renal disease on hemodialysis, asthma, gastroparesis, diabetes mellitus type II, congestive heart failure, hypertension, endometriosis, coronary atherosclerosis  [facility]/ [address]/ TTS RIJ permcath  1. End-stage renal disease on hemodialysis dialysis, TTHS: patient cut her hemodialysis treatment short yesterday.  PermCath to beremoved today given bacteremia/sepsis.  We would like to keep the patient hemodialysis catheter free for several days if possible.  We are awaitingfinal result of peritoneal dialysis catheter cultures.  2. Anemia of UEA:[id number] down to 7.1.  Consider blood transfusion for  hemoglobin of 7 or less.  3. Sepsis: 2 blood cultures are positive.  Enterobacter and Escherichia coli noted.  Infectious disease consultation placed.  Patient has PermCath as well as peritoneal dialysis catheter in place.  Peritoneal dialysis catheter fluid has been cultured and is thus far negative.  We will plan to remove at least a PermCath for now. If patient does not improve we may need to consider removing the peritoneal dialysis catheter as well.  4. Secondary Hyperparathyroidism:  Patient taken off of calcitriol as PTH was previously low.  Continue binder therapy otherwise.   LOS: 0 Ellerie Arenz 9/27/20173:10 PM

## 2015-12-27 NOTE — NC FL2 (Signed)
[place] MEDICAID FL2 LEVEL OF CARE SCREENING TOOL     IDENTIFICATION  Patient Name: Kimberly Montgomery Birthdate: 1965/09/21 Sex: female Admission Date (Current Location): 12/25/2015  [place] and [place] Number:  [address]  [id number]   Facility and Address:  [facility], [address] [place], [place] [address]      Provider Number: [id number]  Attending Physician Name and Address:  Katha Hamming, MD  Relative Name and Phone Number:  Sheppard Coil Daughter [phone]     Current Level of Care: Hospital Recommended Level of Care: Skilled Nursing Facility Prior Approval Number:    Date Approved/Denied:   PASRR Number: [id number]  Discharge Plan: SNF    Current Diagnoses: Patient Active Problem List   Diagnosis Date Noted  . Bacteremia 12/27/2015  . Essential hypertension 11/21/2015  . Hyperlipidemia 11/21/2015  . Anemia 11/21/2015  . Acute respiratory failure with hypoxia (HCC) 11/21/2015  . Acute diastolic CHF (congestive heart failure) (HCC) 11/21/2015  . ESRD on dialysis (HCC) 11/21/2015  . 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 NSTEMI (non-ST elevated myocardial infarction) (HCC) 11/20/2015  . Chest pain, rule out acute myocardial infarction 11/18/2015  . Bipolar I disorder, most recent episode depressed (HCC)   . Altered mental status 08/24/2015  . ESRD (end stage renal disease) (HCC)   . Ileus (HCC)   . Bipolar I disorder (HCC) 07/25/2015  . Seizures (HCC) 07/25/2015  . Peritonitis (HCC) 07/16/2015  . Unstable angina (HCC) 07/09/2015  . ESRD on peritoneal dialysis (HCC) 07/09/2015  . Accelerated hypertension 07/09/2015  . Type 2 diabetes mellitus (HCC) 07/09/2015  . CAD (coronary artery disease) 07/09/2015  . HLD (hyperlipidemia) 07/09/2015  . GERD (gastroesophageal reflux disease) 07/09/2015  . Chest pain 05/12/2015    Orientation RESPIRATION BLADDER Height & Weight     Self, Time, Situation, Place  Normal Incontinent  Weight: 259 lb 14.4 oz (117.9 kg) Height:  5\' 10"  (177.8 cm)  BEHAVIORAL SYMPTOMS/MOOD NEUROLOGICAL BOWEL NUTRITION STATUS    Convulsions/Seizures (None) Continent Diet (Carb Modified)  AMBULATORY STATUS COMMUNICATION OF NEEDS Skin   Total Care Verbally Normal                       Personal Care Assistance Level of Assistance  Bathing, Dressing Bathing Assistance: Maximum assistance   Dressing Assistance: Limited assistance     Functional Limitations Info  Sight, Hearing, Speech Sight Info: Adequate Hearing Info: Adequate Speech Info: Adequate    SPECIAL CARE FACTORS FREQUENCY                       Contractures Contractures Info: Not present    Additional Factors Info  Insulin Sliding Scale Code Status Info: Full Code Allergies Info: CEPHALOSPORINS, PENICILLINS, LAMICTAL LAMOTRIGINE, PHENERGAN PROMETHAZINE HCL, PRAVASTATIN, SULFA ANTIBIOTICS  Psychotropic Info: risperiDONE (RISPERDAL) tablet 1 mg Insulin Sliding Scale Info: 3x a day       Current Medications (12/27/2015):  This is the current hospital active medication list Current Facility-Administered Medications  Medication Dose Route Frequency Provider Last Rate Last Dose  . acetaminophen (TYLENOL) tablet 650 mg  650 mg Oral Q6H PRN Houston Siren, MD   650 mg at 12/27/15 9201   Or  . acetaminophen (TYLENOL) suppository 650 mg  650 mg Rectal Q6H PRN Houston Siren, MD      . bisacodyl (DULCOLAX) suppository 10 mg  10 mg Rectal Daily PRN Houston Siren, MD      . calcium  acetate (PHOSLO) capsule 2,001 mg  2,001 mg Oral TID WC Houston SirenVivek J Sainani, MD   2,001 mg at 12/27/15 1657  . calcium carbonate (TUMS - dosed in mg elemental calcium) chewable tablet 200 mg of elemental calcium  1 tablet Oral TID WC Houston SirenVivek J Sainani, MD   200 mg of elemental calcium at 12/27/15 1657  . clopidogrel (PLAVIX) tablet 75 mg  75 mg Oral Daily Houston SirenVivek J Sainani, MD   75 mg at 12/27/15 0906  . colesevelam [facility](WELCHOL) tablet 625 mg  625  mg Oral BID WC Houston SirenVivek J Sainani, MD   625 mg at 12/27/15 1657  . docusate sodium (COLACE) capsule 100 mg  100 mg Oral QHS Houston SirenVivek J Sainani, MD   100 mg at 12/26/15 2106  . feeding supplement (PRO-STAT SUGAR FREE 64) liquid 30 mL  30 mL Oral TID WC Houston SirenVivek J Sainani, MD   30 mL at 12/27/15 1656  . heparin injection 5,000 Units  5,000 Units Subcutaneous Q8H Alexis Hugelmeyer, DO   5,000 Units at 12/27/15 1443  . hydrALAZINE (APRESOLINE) tablet 25 mg  25 mg Oral Daily Houston SirenVivek J Sainani, MD   25 mg at 12/27/15 0920  . insulin aspart (novoLOG) injection 0-10 Units  0-10 Units Subcutaneous TID WC Houston SirenVivek J Sainani, MD   2 Units at 12/27/15 1714  . insulin glargine (LANTUS) injection 8 Units  8 Units Subcutaneous QHS Houston SirenVivek J Sainani, MD   8 Units at 12/26/15 2107  . isosorbide mononitrate (IMDUR) 24 hr tablet 60 mg  60 mg Oral Daily Houston SirenVivek J Sainani, MD   60 mg at 12/27/15 0908  . lactulose (CHRONULAC) 10 GM/15ML solution 10 g  10 g Oral Daily Houston SirenVivek J Sainani, MD   10 g at 12/27/15 0908  . levETIRAcetam (KEPPRA) tablet 500 mg  500 mg Oral q morning - 10a Houston SirenVivek J Sainani, MD   500 mg at 12/27/15 0907  . levofloxacin (LEVAQUIN) IVPB 500 mg  500 mg Intravenous Q48H Arnaldo NatalMichael S Diamond, MD      . lisinopril (PRINIVIL,ZESTRIL) tablet 40 mg  40 mg Oral Daily Houston SirenVivek J Sainani, MD   40 mg at 12/27/15 0905  . metolazone (ZAROXOLYN) tablet 5 mg  5 mg Oral Daily PRN Houston SirenVivek J Sainani, MD      . metoprolol (LOPRESSOR) tablet 50 mg  50 mg Oral BID Houston SirenVivek J Sainani, MD   50 mg at 12/27/15 0905  . mupirocin ointment (BACTROBAN) 2 % 1 application  1 application Nasal BID Houston SirenVivek J Sainani, MD   1 application at 12/27/15 0911  . nicotine (NICODERM CQ - dosed in mg/24 hours) patch 14 mg  14 mg Transdermal Daily Houston SirenVivek J Sainani, MD   14 mg at 12/27/15 0909  . nitroGLYCERIN (NITROSTAT) SL tablet 0.4 mg  0.4 mg Sublingual Q5 min PRN Houston SirenVivek J Sainani, MD   0.4 mg at 12/26/15 0345  . nystatin (MYCOSTATIN/NYSTOP) topical powder 1 g  1 g Topical  BID Houston SirenVivek J Sainani, MD   1 g at 12/27/15 1059  . ondansetron (ZOFRAN) tablet 4 mg  4 mg Oral Q6H PRN Houston SirenVivek J Sainani, MD       Or  . ondansetron (ZOFRAN) injection 4 mg  4 mg Intravenous Q6H PRN Houston SirenVivek J Sainani, MD      . pantoprazole (PROTONIX) EC tablet 40 mg  40 mg Oral Daily Houston SirenVivek J Sainani, MD   40 mg at 12/27/15 0921  . polyethylene glycol (MIRALAX / GLYCOLAX) packet 17  g  17 g Oral Daily Houston Siren, MD   17 g at 12/27/15 0908  . ranolazine (RANEXA) 12 hr tablet 500 mg  500 mg Oral BID Houston Siren, MD   500 mg at 12/27/15 0906  . risperiDONE (RISPERDAL) tablet 1 mg  1 mg Oral TID Houston Siren, MD   1 mg at 12/27/15 1657  . senna (SENOKOT) tablet 17.2 mg  2 tablet Oral BID Houston Siren, MD   17.2 mg at 12/27/15 0905  . sodium chloride flush (NS) 0.9 % injection 3 mL  3 mL Intravenous Q12H Houston Siren, MD   3 mL at 12/27/15 1000  . sodium phosphate (FLEET) 7-19 GM/118ML enema 1 enema  1 enema Rectal Once Katha Hamming, MD      . torsemide (DEMADEX) tablet 100 mg  100 mg Oral Daily Houston Siren, MD   100 mg at 12/27/15 0906  . white petrolatum (VASELINE) gel   Topical Q72H Houston Siren, MD         Discharge Medications: Please see discharge summary for a list of discharge medications.  Relevant Imaging Results:  Relevant Lab Results:   Additional Information SSN [id number]  Darleene Cleaver

## 2015-12-27 NOTE — Progress Notes (Signed)
permacath removed by vascular at this time,no bleeding noted post removal

## 2015-12-27 NOTE — Progress Notes (Signed)
Patient ID: Kimberly Montgomery, female   DOB: 10/17/1965, 50 y.o.   MRN: [id number]   [facility] [place] [facility]   PATIENT NAME: Kimberly Montgomery    MR#:  [id number]  DATE OF BIRTH:  04/08/1965  SUBJECTIVE:  Pt had fever, chills yesterday, blood cultures are positive for Escherichia coli from September 25. Patient has a permacath that needs to be removed so vascular  Is  consulted. Patient denies any complaints no chest pain.   CHIEF COMPLAINT:   Chief Complaint  Patient presents with  . Chest Pain  50 year old female with past medical history of end-stage renal disease on hemodialysis, hypertension, hyperlipidemia, peripheral vascular disease, diabetes, history of coronary artery disease who Was admitted to the hospital due to chest pain. Today she states that her pain is completely resolved. She denies any chest pain, shortness of breath, nausea, vomiting, palpitations. Cardiology consultation and recommendations appreciated.   REVIEW OF SYSTEMS:  ROS CONSTITUTIONAL: No fever/chills, fatigue, weakness, weight gain/loss, headache. EYES: No blurry or double vision. ENT: No tinnitus, postnasal drip, redness or soreness of the oropharynx. RESPIRATORY: No cough, dyspnea, wheeze, hemoptysis.  CARDIOVASCULAR: No chest pain, palpitations, syncope, orthopnea,  GASTROINTESTINAL: No nausea, vomiting, constipation, diarrhea, abdominal pain, hematemesis, melena or hematochezia. GENITOURINARY: No dysuria, frequency, hematuria. ENDOCRINE: No polyuria or nocturia. No heat or cold intolerance. HEMATOLOGY: No anemia, bruising, bleeding. INTEGUMENTARY: No rashes, ulcers, lesions. MUSCULOSKELETAL: No arthritis, gout, edema. NEUROLOGIC: No numbness, tingling, ataxia, seizure-type activity, weakness. PSYCHIATRIC: No anxiety, depression, insomnia.   DRUG ALLERGIES:   Allergies  Allergen Reactions  . Cephalosporins Anaphylaxis  . Penicillins Anaphylaxis and Other (See Comments)   Has patient had a PCN reaction causing immediate rash, facial/tongue/throat swelling, SOB or lightheadedness with hypotension: Yes Has patient had a PCN reaction causing severe rash involving mucus membranes or skin necrosis: No Has patient had a PCN reaction that required hospitalization No Has patient had a PCN reaction occurring within the last 10 years: No If all of the above answers are "NO", then may proceed with Cephalosporin use.  . Lamictal [Lamotrigine] Other (See Comments)    Reaction:  Hallucinations  . Phenergan [Promethazine Hcl] Nausea And Vomiting  . Pravastatin Other (See Comments)    Reaction:  Muscle pain   . Sulfa Antibiotics Other (See Comments)    Reaction:  Unknown    VITALS:  Blood pressure (!) 162/69, pulse 71, temperature 97.9 F (36.6 C), temperature source Oral, resp. rate 20, height 5\' 10"  (1.778 m), weight 117.9 kg (259 lb 14.4 oz), last menstrual period 03/16/2015, SpO2 99 %. PHYSICAL EXAMINATION:  Physical Exam  GENERAL:  50 y.o.-year-old white female patient lying in the bed in no acute distress.  EYES: Pupils equal, round, reactive to light and accommodation. No scleral icterus. Extraocular muscles intact.  HEENT: Head atraumatic, normocephalic. Oropharynx and nasopharynx clear. No oropharyngeal erythema, moist oral mucosa  NECK:  Supple, no jugular venous distention. No thyroid enlargement, no tenderness.  LUNGS: Normal breath sounds bilaterally, no wheezing, rales, rhonchi. No use of accessory muscles of respiration.  CARDIOVASCULAR: S1, S2 RRR. No murmurs, rubs, gallops, clicks. Right chest wall dialysis catheter in place ABDOMEN: Soft, nontender, nondistended. Bowel sounds present. No organomegaly or mass. Positive peritoneal dialysis catheter in place without acute drainage. EXTREMITIES: No pedal edema, cyanosis, or clubbing. + 2 pedal & radial pulses b/l.   NEUROLOGIC: Cranial nerves II through XII are intact. No focal Motor or sensory deficits  appreciated b/l.  Globally weak.  PSYCHIATRIC: The patient is alert and oriented x 2.  SKIN: No obvious rash, lesion, or ulcer.    LABORATORY PANEL:   CBC  Recent Labs Lab 12/26/15 0431  WBC 11.0  HGB 7.4*  HCT 22.5*  PLT 207   ------------------------------------------------------------------------------------------------------------------ Chemistries   Recent Labs Lab 12/25/15 1434 12/26/15 0431  NA 131* 131*  K 5.0 5.2*  CL 96* 98*  CO2 28 26  GLUCOSE 132* 186*  BUN 64* 68*  CREATININE 5.65* 6.05*  CALCIUM 9.3 9.1  AST 17  --   ALT 9*  --   ALKPHOS 66  --   BILITOT 0.5  --    RADIOLOGY:  No results found. ASSESSMENT AND PLAN:   1. Chest pain with elevated troponin which is trending down. 1.01-0.63-0.54. Likely secondary to bacteremia: No chest pain today. No further workup . 2. Sepsis - blood cultures positive for enterococcus and Escherichia coli. Continue Levaquin and follow-up sensitivities. Urinalysis and chest x-ray have been negative. Patient has permacath that needs to be removed, vascular was consulted.  2. End-stage renal disease on hemodialysis-patient gets scheduled dialysis on Tuesday Thursday Saturday.  3. Diabetes type 2 without complication-continue Lantus, sliding scale insulin.  4. Essential hypertension-continue lisinopril, metoprolol, Imdur.  5. History of seizures-continue Keppra.  6. GERD-continue Protonix.  7. Secondary hyperparathyroidism-continue calcium acetate.  #8. Chronic deconditioning: Patient says that she is bedbound . Discussed with patient, appreciate presence of  RN  during rounds.   All the records are reviewed and case discussed with Care Management/Social Worker. Management plans discussed with the patient, family and they are in agreement.  CODE STATUS: FULL  TOTAL TIME TAKING CARE OF THIS PATIENT: 25 minutes.   More than 50% of the time was spent in counseling/coordination of care: YES  POSSIBLE  D/C IN 1-2 DAYS, DEPENDING ON CLINICAL CONDITION.   Katha Hamming M.D on 12/27/2015 at 12:40 PM  Between 7am to 6pm - Pager - [phone]  After 6pm go to [email or web address] - Social research officer, government  [facility]  Office  [phone]  CC: Primary care physician; Jarome Matin, MD  Note: This dictation was prepared with Dragon dictation along with smaller phrase technology. Any transcriptional errors that result from this process are unintentional.

## 2015-12-27 NOTE — Progress Notes (Signed)
  SUBJECTIVE: The patient is alert and oriented, slow to respond, and eating breakfast. She denies any chest pain.   Vitals:   12/26/15 1711 12/26/15 1853 12/26/15 1943 12/27/15 0518  BP: (!) 162/80  (!) 140/57 (!) 144/64  Pulse: 100  90 73  Resp: 18  18 18   Temp:  99.2 F (37.3 C) 99.8 F (37.7 C) 98.4 F (36.9 C)  TempSrc:  Oral Oral Oral  SpO2: 100%  98% 100%  Weight:    259 lb 14.4 oz (117.9 kg)  Height:        Intake/Output Summary (Last 24 hours) at 12/27/15 0909 Last data filed at 12/26/15 1705  Gross per 24 hour  Intake                0 ml  Output              700 ml  Net             -700 ml    LABS: Basic Metabolic Panel:  Recent Labs  97/67/34 1434 12/26/15 0431  NA 131* 131*  K 5.0 5.2*  CL 96* 98*  CO2 28 26  GLUCOSE 132* 186*  BUN 64* 68*  CREATININE 5.65* 6.05*  CALCIUM 9.3 9.1   Liver Function Tests:  Recent Labs  12/25/15 1434  AST 17  ALT 9*  ALKPHOS 66  BILITOT 0.5  PROT 5.0*  ALBUMIN 2.2*   No results for input(s): LIPASE, AMYLASE in the last 72 hours. CBC:  Recent Labs  12/25/15 1434 12/26/15 0431  WBC 11.0 11.0  NEUTROABS 9.6*  --   HGB 7.7* 7.4*  HCT 23.5* 22.5*  MCV 95.2 96.4  PLT 226 207   Cardiac Enzymes:  Recent Labs  12/25/15 2036 12/26/15 0120 12/26/15 0431  TROPONINI 1.01* 0.63* 0.54*   BNP: Invalid input(s): POCBNP D-Dimer: No results for input(s): DDIMER in the last 72 hours. Hemoglobin A1C: No results for input(s): HGBA1C in the last 72 hours. Fasting Lipid Panel: No results for input(s): CHOL, HDL, LDLCALC, TRIG, CHOLHDL, LDLDIRECT in the last 72 hours. Thyroid Function Tests: No results for input(s): TSH, T4TOTAL, T3FREE, THYROIDAB in the last 72 hours.  Invalid input(s): FREET3 Anemia Panel: No results for input(s): VITAMINB12, FOLATE, FERRITIN, TIBC, IRON, RETICCTPCT in the last 72 hours.   PHYSICAL EXAM General: Well developed, well nourished, in no acute distress HEENT:  Normocephalic  and atramatic Neck:  No JVD.  Lungs: Clear bilaterally to auscultation and percussion. Heart: HRRR . Normal S1 and S2 without gallops or murmurs.  Abdomen: Bowel sounds are positive, abdomen soft and non-tender  Extremities: Right lower extremity amputation, 1+ left pedal edema Neuro: Alert and oriented X 3, slow to respond. Psych:  Slow to respond and not very conversant  TELEMETRY: Sinus rhythm in the 60s and 70s  ASSESSMENT AND PLAN: Hemodialysis patient admitted with chest pain and slightly elevated troponin levels. Troponins have trended downward and she no longer is complaining of chest pain. A cardiac catheterization on 11/20/2015 revealed patent stent in the LAD and mild to moderate CAD elsewhere. She has been found to have enterococcus and Escherichia coli sepsis and is being followed by nephrology for hemodialysis. Her elevated troponins are not likely related to cardiac ischemia. Vital signs remained stable except for intermittent fever and she continues in sinus rhythm. We'll continue to monitor.  Active Problems:   Chest pain    Berton Bon, NP 12/27/2015 9:09 AM

## 2015-12-27 NOTE — Op Note (Signed)
  OPERATIVE NOTE   PROCEDURE: 1. Removal of a right IJ tunneled dialysis catheter  PRE-OPERATIVE DIAGNOSIS: Complication of dialysis catheter, End stage renal disease  POST-OPERATIVE DIAGNOSIS: Same  SURGEON: Levora Dredge, M.D.  Assistant:  Ms. Raul Del  ANESTHESIA: Local anesthetic with 1% lidocaine with epinephrine   ESTIMATED BLOOD LOSS: Minimal   FINDING(S): 1. Catheter intact   SPECIMEN(S):  Catheter  INDICATIONS:   Kimberly Montgomery is a 50 y.o. female who presents with sepsis.  The patient has undergone placement of an extremity access which is working and this has been successfully cannulated without difficulty.  therefore is undergoing removal of his tunneled catheter which is no longer needed to avoid septic complications.   DESCRIPTION: After obtaining full informed written consent, the patient was positioned supine. The right IJ catheter and surrounding area is prepped and draped in a sterile fashion. The cuff was localized by palpation and noted to be less than 3 cm from the exit site. After appropriate timeout is called, 1% lidocaine with epinephrine is infiltrated into the surrounding tissues around the cuff. Small transverse incision is created at the exit site with an 11 blade scalpel and the dissection was carried up along the catheter to expose the cuff of the tunneled catheter.  The catheter cuff is then freed from the surrounding attachments and adhesions. Once the catheter has been freed circumferentially it is removed in 1 piece. Light pressure was held at the base of the neck.   Antibiotic ointment and a sterile dressing is applied to the exit site. Patient tolerated procedure well and there were no complications.  COMPLICATIONS: None  CONDITION: Unchanged  Levora Dredge, M.D. [facility] Office: [phone]  12/27/2015,7:18 PM

## 2015-12-27 NOTE — Care Management (Signed)
Patient has positive  blood cultures thought to be related to PD cath or perm cath.  There is discussion about removal of one or both devices. ID consult will be ordered.  vascular consult present.  Patient had been placed in observation.  Notified UR .

## 2015-12-27 NOTE — Progress Notes (Signed)
Remains on contact precautions,PD catheter cultured todat,vascular consultation for removal and placement of HD cath,no BM for 1 week,laxatives and stool softners given but ineffective,fleet enema ordered.

## 2015-12-27 NOTE — Clinical Social Work Note (Signed)
Clinical Social Work Assessment  Patient Details  Name: Kimberly Montgomery MRN: [id number] Date of Birth: 07-16-1965  Date of referral:  12/27/15               Reason for consult:  Facility Placement                Permission sought to share information with:  Facility Medical sales representative, Family Supports Permission granted to share information::  Yes, Verbal Permission Granted  Name::     Sheppard Coil Daughter [phone]   Agency::  SNF admissions  Relationship::     Contact Information:     Housing/Transportation Living arrangements for the past 2 months:  Skilled Nursing Facility Source of Information:  Patient, Adult Children Patient Interpreter Needed:  None Criminal Activity/Legal Involvement Pertinent to Current Situation/Hospitalization:  No - Comment as needed Significant Relationships:  Adult Children Lives with:  Facility Resident Do you feel safe going back to the place where you live?  Yes Need for family participation in patient care:  Yes (Comment) (Patient requests daughter to help with decision making.)  Care giving concerns:  Patient needs LTC SNF placement, she does not want to return to the SNF she was at.   Social Worker assessment / plan: Patient is a 50 year old female who has been living at [facility] for the past 5 months.  Patient is alert and oriented x4 and able to express her feelings.  Patient and her daughter would like to move to a different SNF and has applied for long term care Medicaid and is waiting for approval.  Patient states she understands she can not go to her daughter's house, because her medical needs are too high.  Patient expresses she has used up her [facility] Medicare Days for rehab, and is now in copay days.  Patient states they are waiting for long term care Medicaid approval.  Patient would like to go to a different SNF in [facility], and is aware that her options are going to be limited.  Patient was explained role of MSW  and process of trying to find a long term care bed.  Patient and daughter did not express any other questions.   Employment status:  Disabled (Comment on whether or not currently receiving Disability) Insurance information:  Medicaid [place], [employer] PT Recommendations:  Not assessed at this time Information / Referral to community resources:  Skilled Nursing Facility  Patient/Family's Response to care:  Patient and family are agreeable to going to a different SNF for long term care.  Patient/Family's Understanding of and Emotional Response to Diagnosis, Current Treatment, and Prognosis:  Patient expressed she has many medical needs and expresses that she needs to go to a different SNF as a long term care resident.  Emotional Assessment Appearance:  Appears stated age Attitude/Demeanor/Rapport:    Affect (typically observed):  Appropriate, Calm, Stable Orientation:  Oriented to Self, Oriented to Place, Oriented to Situation, Oriented to  Time Alcohol / Substance use:  Not Applicable Psych involvement (Current and /or in the community):  No (Comment)  Discharge Needs  Concerns to be addressed:  Discharge Planning Concerns Readmission within the last 30 days:  No Current discharge risk:  Other (Patient needs long term care SNF) Barriers to Discharge:  Continued Medical Work up, [employer]   Windell Moulding R 12/27/2015, 6:14 PM

## 2015-12-28 LAB — BLOOD CULTURE ID PANEL (REFLEXED)
Acinetobacter baumannii: NOT DETECTED
CANDIDA GLABRATA: NOT DETECTED
CARBAPENEM RESISTANCE: NOT DETECTED
Candida albicans: NOT DETECTED
Candida krusei: NOT DETECTED
Candida parapsilosis: NOT DETECTED
Candida tropicalis: NOT DETECTED
ENTEROCOCCUS SPECIES: NOT DETECTED
ESCHERICHIA COLI: NOT DETECTED
Enterobacter cloacae complex: DETECTED — AB
Enterobacteriaceae species: DETECTED — AB
HAEMOPHILUS INFLUENZAE: NOT DETECTED
Klebsiella oxytoca: NOT DETECTED
Klebsiella pneumoniae: NOT DETECTED
LISTERIA MONOCYTOGENES: NOT DETECTED
NEISSERIA MENINGITIDIS: NOT DETECTED
Proteus species: NOT DETECTED
Pseudomonas aeruginosa: NOT DETECTED
SERRATIA MARCESCENS: NOT DETECTED
STAPHYLOCOCCUS AUREUS BCID: NOT DETECTED
STAPHYLOCOCCUS SPECIES: NOT DETECTED
STREPTOCOCCUS AGALACTIAE: NOT DETECTED
STREPTOCOCCUS PNEUMONIAE: NOT DETECTED
STREPTOCOCCUS SPECIES: NOT DETECTED
Streptococcus pyogenes: NOT DETECTED

## 2015-12-28 LAB — PHOSPHORUS: Phosphorus: 3.7 mg/dL (ref 2.5–4.6)

## 2015-12-28 LAB — GLUCOSE, CAPILLARY
GLUCOSE-CAPILLARY: 120 mg/dL — AB (ref 65–99)
GLUCOSE-CAPILLARY: 150 mg/dL — AB (ref 65–99)
GLUCOSE-CAPILLARY: 232 mg/dL — AB (ref 65–99)
Glucose-Capillary: 137 mg/dL — ABNORMAL HIGH (ref 65–99)
Glucose-Capillary: 134 mg/dL — ABNORMAL HIGH (ref 65–99)

## 2015-12-28 MED ORDER — DELFLEX-LC/2.5% DEXTROSE 394 MOSM/L IP SOLN
INTRAPERITONEAL | Status: DC
  Administered 2015-12-28: 18:00:00 via INTRAPERITONEAL
  Administered 2015-12-29: 8 L via INTRAPERITONEAL
  Administered 2015-12-31: 6 L via INTRAPERITONEAL
  Filled 2015-12-28 (×8): qty 3000

## 2015-12-28 MED ORDER — GENTAMICIN SULFATE 0.1 % EX CREA
1.0000 "application " | TOPICAL_CREAM | Freq: Every day | CUTANEOUS | Status: DC
  Administered 2015-12-28 – 2016-01-04 (×3): 1 via TOPICAL
  Filled 2015-12-28: qty 15

## 2015-12-28 NOTE — Consult Note (Signed)
[facility] Infectious Disease     Reason for Consult:Sepsis   Referring Physician: Governor Specking Date of Admission:  12/25/2015   Active Problems:   Chest pain   Bacteremia  HPI: Kimberly Montgomery is a 50 y.o. female admitted from SNF with chest pain and found to have low grade fevers, wbc 11, and bcx +  Enterobacter. Had fluid from PD sent  with only 30 wbcand cx neg. UA neg.  He has ESRD and had both PD and R IJ in place but Permacath removed.  Somewhat confused today.   Past Medical History:  Diagnosis Date  . Asthma   . CAD (coronary artery disease)   . CHF (congestive heart failure) ([place])   . Diabetes mellitus without complication ([place])   . Endometriosis   . ESRD (end stage renal disease) ([place])   . Gastroparesis   . GERD (gastroesophageal reflux disease)   . HLD (hyperlipidemia)   . Hypertension   . Renal disorder    Past Surgical History:  Procedure Laterality Date  . BELOW KNEE LEG AMPUTATION    . CARDIAC CATHETERIZATION Right 07/10/2015   Procedure: Left Heart Cath and Coronary Angiography;  Surgeon: Dionisio , MD;  Location: [place] [facility];  Service: Cardiovascular;  Laterality: Right;  . CARDIAC CATHETERIZATION Right 09/14/2015   Procedure: Left Heart Cath and Coronary Angiography;  Surgeon: Dionisio , MD;  Location: [place] [facility];  Service: Cardiovascular;  Laterality: Right;  . CARDIAC CATHETERIZATION N/A 09/14/2015   Procedure: Coronary Stent Intervention;  Surgeon: Yolonda Kida, MD;  Location: [place] [facility];  Service: Cardiovascular;  Laterality: N/A;  . CARDIAC CATHETERIZATION Right 11/20/2015   Procedure: Left Heart Cath and Coronary Angiography;  Surgeon: Dionisio , MD;  Location: [place] [facility];  Service: Cardiovascular;  Laterality: Right;  . hd fistula surgery with reversal    . HYSTEROTOMY    . pd tube inserstion    . PERIPHERAL VASCULAR CATHETERIZATION N/A 08/30/2015   Procedure: Dialysis/Perma Catheter  Insertion;  Surgeon: Algernon Huxley, MD;  Location: [place] [facility];  Service: Cardiovascular;  Laterality: N/A;   Social History  Substance Use Topics  . Smoking status: Former Smoker    Packs/day: 0.50    Types: Cigarettes  . Smokeless tobacco: Never Used  . Alcohol use No   Family History  Problem Relation Age of Onset  . CAD    . Diabetes    . Bipolar disorder    . Cervical cancer Mother     Allergies:  Allergies  Allergen Reactions  . Cephalosporins Anaphylaxis  . Penicillins Anaphylaxis and Other (See Comments)    Has patient had a PCN reaction causing immediate rash, facial/tongue/throat swelling, SOB or lightheadedness with hypotension: Yes Has patient had a PCN reaction causing severe rash involving mucus membranes or skin necrosis: No Has patient had a PCN reaction that required hospitalization No Has patient had a PCN reaction occurring within the last 10 years: No If all of the above answers are "NO", then may proceed with Cephalosporin use.  . Lamictal [Lamotrigine] Other (See Comments)    Reaction:  Hallucinations  . Phenergan [Promethazine Hcl] Nausea And Vomiting  . Pravastatin Other (See Comments)    Reaction:  Muscle pain   . Sulfa Antibiotics Other (See Comments)    Reaction:  Unknown     Current antibiotics: Antibiotics Given (last 72 hours)    Date/Time Action Medication Dose Rate   12/26/15  0918 Given   levofloxacin (LEVAQUIN) IVPB 750 mg 750 mg 100 mL/hr   12/27/15 2143 Given   levofloxacin (LEVAQUIN) IVPB 500 mg 500 mg 100 mL/hr      MEDICATIONS: . calcium acetate  2,001 mg Oral TID WC  . calcium carbonate  1 tablet Oral TID WC  . clopidogrel  75 mg Oral Daily  . colesevelam  625 mg Oral BID WC  . docusate sodium  100 mg Oral QHS  . feeding supplement (PRO-STAT SUGAR FREE 64)  30 mL Oral TID WC  . heparin subcutaneous  5,000 Units Subcutaneous Q8H  . hydrALAZINE  25 mg Oral Daily  . insulin aspart  0-10 Units Subcutaneous TID WC  .  insulin glargine  8 Units Subcutaneous QHS  . isosorbide mononitrate  60 mg Oral Daily  . lactulose  10 g Oral Daily  . levETIRAcetam  500 mg Oral q morning - 10a  . levofloxacin (LEVAQUIN) IV  500 mg Intravenous Q48H  . lisinopril  40 mg Oral Daily  . metoprolol  50 mg Oral BID  . mupirocin ointment  1 application Nasal BID  . nicotine  14 mg Transdermal Daily  . nystatin  1 g Topical BID  . pantoprazole  40 mg Oral Daily  . polyethylene glycol  17 g Oral Daily  . ranolazine  500 mg Oral BID  . risperiDONE  1 mg Oral TID  . senna  2 tablet Oral BID  . sodium chloride flush  3 mL Intravenous Q12H  . torsemide  100 mg Oral Daily  . white petrolatum   Topical Q72H    Review of Systems - 11 systems reviewed and negative per HPI   OBJECTIVE: Temp:  [97.8 F (36.6 C)-98.5 F (36.9 C)] 98.4 F (36.9 C) (09/28 1141) Pulse Rate:  [66-73] 66 (09/28 1141) Resp:  [16-18] 16 (09/28 1141) BP: (157-183)/(57-74) 157/57 (09/28 1141) SpO2:  [97 %-100 %] 98 % (09/28 1141) Weight:  [118.4 kg (261 lb 1.6 oz)] 118.4 kg (261 lb 1.6 oz) (09/28 0531) Physical Exam  Constitutional:  Morbidly obese, lethargic HENT: [place]/AT, PERRLA, no scleral icterus Mouth/Throat: Oropharynx is clear and dry . No oropharyngeal exudate.  Cardiovascular: Normal rate, regular rhythm 2/6 sm Pulmonary/Chest: Effort normal and breath sounds normal. No respiratory distress.  has no wheezes.  Neck = supple, no nuchal rigidity Abdominal: Soft. Distended, obese, mild difusse ttp. PD cath RLQ wnl Lymphadenopathy: no cervical adenopathy. No axillary adenopath Neurological: lethargic Skin: Skin is warm and dry. No rash noted. No erythema.  Psychiatric: flat affect  LABS: Results for orders placed or performed during the hospital encounter of 12/25/15 (from the past 48 hour(s))  Glucose, capillary     Status: Abnormal   Collection Time: 12/26/15  5:18 PM  Result Value Ref Range   Glucose-Capillary 134 (H) 65 - 99 mg/dL   Glucose, capillary     Status: Abnormal   Collection Time: 12/26/15  8:34 PM  Result Value Ref Range   Glucose-Capillary 198 (H) 65 - 99 mg/dL  Glucose, capillary     Status: Abnormal   Collection Time: 12/27/15  8:21 AM  Result Value Ref Range   Glucose-Capillary 113 (H) 65 - 99 mg/dL   Comment 1 Notify RN    Comment 2 Document in Chart   Glucose, capillary     Status: Abnormal   Collection Time: 12/27/15 11:24 AM  Result Value Ref Range   Glucose-Capillary 142 (H) 65 - 99 mg/dL  Comment 1 Notify RN    Comment 2 Document in Chart   CBC     Status: Abnormal   Collection Time: 12/27/15  1:47 PM  Result Value Ref Range   WBC 6.3 3.6 - 11.0 K/uL   RBC 2.27 (L) 3.80 - 5.20 MIL/uL   Hemoglobin 7.1 (L) 12.0 - 16.0 g/dL   HCT 21.4 (L) 35.0 - 47.0 %   MCV 94.3 80.0 - 100.0 fL   MCH 31.3 26.0 - 34.0 pg   MCHC 33.1 32.0 - 36.0 g/dL   RDW 14.1 11.5 - 14.5 %   Platelets 200 150 - 440 K/uL  Body fluid cell count with differential     Status: Abnormal   Collection Time: 12/27/15  4:15 PM  Result Value Ref Range   Fluid Type-FCT PERITONEAL     Comment: CORRECTED ON 09/27 AT 1626: PREVIOUSLY REPORTED AS Peritoneal   Color, Fluid COLORLESS (A) YELLOW   Appearance, Fluid CLEAR CLEAR   WBC, Fluid 30 cu mm   Neutrophil Count, Fluid 50 %   Lymphs, Fluid 30 %   Monocyte-Macrophage-Serous Fluid 20 %   Eos, Fluid 0 %  Culture, body fluid-bottle     Status: None (Preliminary result)   Collection Time: 12/27/15  4:15 PM  Result Value Ref Range   Specimen Description PERITONEAL DIALYSATE    Special Requests NONE    Culture      NO GROWTH < 24 HOURS Performed at [facility]    Report Status PENDING   Gram stain     Status: None   Collection Time: 12/27/15  4:15 PM  Result Value Ref Range   Specimen Description PERITONEAL DIALYSATE    Special Requests NONE    Gram Stain      WBC PRESENT,BOTH PMN AND MONONUCLEAR NO ORGANISMS SEEN CYTOSPIN Performed at [facility]     Report Status 12/27/2015 FINAL   Glucose, capillary     Status: Abnormal   Collection Time: 12/27/15  4:27 PM  Result Value Ref Range   Glucose-Capillary 159 (H) 65 - 99 mg/dL   Comment 1 Notify RN    Comment 2 Document in Chart   Glucose, capillary     Status: Abnormal   Collection Time: 12/27/15  9:12 PM  Result Value Ref Range   Glucose-Capillary 159 (H) 65 - 99 mg/dL  Glucose, capillary     Status: Abnormal   Collection Time: 12/28/15  7:15 AM  Result Value Ref Range   Glucose-Capillary 120 (H) 65 - 99 mg/dL  Glucose, capillary     Status: Abnormal   Collection Time: 12/28/15 11:00 AM  Result Value Ref Range   Glucose-Capillary 150 (H) 65 - 99 mg/dL   No components found for: ESR, C REACTIVE PROTEIN MICRO: Recent Results (from the past 720 hour(s))  Blood culture (routine x 2)     Status: Abnormal (Preliminary result)   Collection Time: 12/25/15  2:32 PM  Result Value Ref Range Status   Specimen Description BLOOD  L HAND  Final   Special Requests   Final    BOTTLES DRAWN AEROBIC AND ANAEROBIC  AER 12 ML ANA 14 ML   Culture  Setup Time   Final    GRAM NEGATIVE RODS IN BOTH AEROBIC AND ANAEROBIC BOTTLES CRITICAL VALUE NOTED.  VALUE IS CONSISTENT WITH PREVIOUSLY REPORTED AND CALLED VALUE. SDR    Culture (A)  Final    ENTEROBACTER CLOACAE SUSCEPTIBILITIES TO FOLLOW Performed at [facility]  Report Status PENDING  Incomplete  Blood culture (routine x 2)     Status: Abnormal (Preliminary result)   Collection Time: 12/25/15  2:32 PM  Result Value Ref Range Status   Specimen Description BLOOD  L FA  Final   Special Requests   Final    BOTTLES DRAWN AEROBIC AND ANAEROBIC  AER 12 ML ANA 12 ML   Culture  Setup Time   Final    GRAM NEGATIVE RODS IN BOTH AEROBIC AND ANAEROBIC BOTTLES CRITICAL RESULT CALLED TO, READ BACK BY AND VERIFIED WITH: NATE COOKSON AT 4081 12/26/15 [place]    Culture (A)  Final    ENTEROBACTER CLOACAE SUSCEPTIBILITIES TO FOLLOW Performed at  [facility]    Report Status PENDING  Incomplete  Blood Culture ID Panel (Reflexed)     Status: Abnormal   Collection Time: 12/25/15  2:32 PM  Result Value Ref Range Status   Enterococcus species NOT DETECTED NOT DETECTED Final   Listeria monocytogenes NOT DETECTED NOT DETECTED Final   Staphylococcus species NOT DETECTED NOT DETECTED Final   Staphylococcus aureus NOT DETECTED NOT DETECTED Final   Streptococcus species NOT DETECTED NOT DETECTED Final   Streptococcus agalactiae NOT DETECTED NOT DETECTED Final   Streptococcus pneumoniae NOT DETECTED NOT DETECTED Final   Streptococcus pyogenes NOT DETECTED NOT DETECTED Final   Acinetobacter baumannii NOT DETECTED NOT DETECTED Final   Enterobacteriaceae species DETECTED (A) NOT DETECTED Final    Comment: CRITICAL RESULT CALLED TO, READ BACK BY AND VERIFIED WITH:  NATE COOKSON AT 0535 12/26/15 SDR    Enterobacter cloacae complex DETECTED (A) NOT DETECTED Corrected    Comment: CRITICAL RESULT CALLED TO, READ BACK BY AND VERIFIED WITH: K MERRILL 12/28/15 @ 1210 M VESTAL CORRECTED ON 09/28 AT 1211: PREVIOUSLY REPORTED AS NOT DETECTED    Escherichia coli NOT DETECTED NOT DETECTED Corrected    Comment: PREVIOUSLY REPORTED AS: DETECTED CORRECTED RESULTS CALLED TO: K MERRILL 12/28/15 @ 24 M VESTAL Performed at [place] 09/28 AT 4481: PREVIOUSLY REPORTED AS DETECTED CRITICAL RESULT CALLED TO, READ BACK BY AND VERIFIED WITH:  NATE COOKSON AT 8563 12/26/15 SDR    Klebsiella oxytoca NOT DETECTED NOT DETECTED Final   Klebsiella pneumoniae NOT DETECTED NOT DETECTED Final   Proteus species NOT DETECTED NOT DETECTED Final   Serratia marcescens NOT DETECTED NOT DETECTED Final   Carbapenem resistance NOT DETECTED NOT DETECTED Final   Haemophilus influenzae NOT DETECTED NOT DETECTED Final   Neisseria meningitidis NOT DETECTED NOT DETECTED Final   Pseudomonas aeruginosa NOT DETECTED NOT DETECTED Final   Candida albicans  NOT DETECTED NOT DETECTED Final   Candida glabrata NOT DETECTED NOT DETECTED Final   Candida krusei NOT DETECTED NOT DETECTED Final   Candida parapsilosis NOT DETECTED NOT DETECTED Final   Candida tropicalis NOT DETECTED NOT DETECTED Final  Culture, body fluid-bottle     Status: None (Preliminary result)   Collection Time: 12/25/15  4:00 PM  Result Value Ref Range Status   Specimen Description FLUID PERITONEAL DIALYSATE  Final   Special Requests BOTTLES DRAWN AEROBIC AND ANAEROBIC 10CC  Final   Culture   Final    NO GROWTH 3 DAYS Performed at [facility]    Report Status PENDING  Incomplete  Gram stain     Status: None   Collection Time: 12/25/15  4:00 PM  Result Value Ref Range Status   Specimen Description FLUID PERITONEAL DIALYSATE  Final   Special Requests NONE  Final   Gram Stain   Final    FEW WBC PRESENT,BOTH PMN AND MONONUCLEAR NO ORGANISMS SEEN Performed at [facility]    Report Status 12/27/2015 FINAL  Final  MRSA PCR Screening     Status: Abnormal   Collection Time: 12/26/15  1:34 PM  Result Value Ref Range Status   MRSA by PCR POSITIVE (A) NEGATIVE Final    Comment:        The GeneXpert MRSA Assay (FDA approved for NASAL specimens only), is one component of a comprehensive MRSA colonization surveillance program. It is not intended to diagnose MRSA infection nor to guide or monitor treatment for MRSA infections. RESULT CALLED TO, READ BACK BY AND VERIFIED WITH: AMY TODD RN AT 3299 12/26/15 [place].   Culture, body fluid-bottle     Status: None (Preliminary result)   Collection Time: 12/27/15  4:15 PM  Result Value Ref Range Status   Specimen Description PERITONEAL DIALYSATE  Final   Special Requests NONE  Final   Culture   Final    NO GROWTH < 24 HOURS Performed at [facility]    Report Status PENDING  Incomplete  Gram stain     Status: None   Collection Time: 12/27/15  4:15 PM  Result Value Ref Range Status   Specimen Description  PERITONEAL DIALYSATE  Final   Special Requests NONE  Final   Gram Stain   Final    WBC PRESENT,BOTH PMN AND MONONUCLEAR NO ORGANISMS SEEN CYTOSPIN Performed at [facility]    Report Status 12/27/2015 FINAL  Final    IMAGING: Dg Chest 1 View  Result Date: 12/07/2015 CLINICAL DATA:  Chest pain during dialysis EXAM: CHEST 1 VIEW COMPARISON:  12/05/2015 FINDINGS: Cardiac shadow is enlarged but stable from the prior exam. A right jugular dialysis catheter is again seen and stable. The lungs are clear bilaterally. No acute bony abnormality is seen. IMPRESSION: No active disease. Electronically Signed   By: Inez Catalina M.D.   On: 12/07/2015 08:27   Dg Chest 2 View  Result Date: 12/24/2015 CLINICAL DATA:  Chest pain this morning. Previous coronary artery stent placement. EXAM: CHEST  2 VIEW COMPARISON:  12/07/2015. FINDINGS: Stable enlarged cardiac silhouette. The right jugular catheter tip remains in the superior vena cava. The interstitial markings are minimally prominent. Otherwise, clear lungs. Small to moderate-sized pleural effusion posteriorly, most likely on the left. Aortic arch calcification. Mild thoracic spine degenerative changes. Minimal bilateral shoulder degenerative changes. IMPRESSION: 1. Small to moderate size left pleural effusion posteriorly at the lung bases. 2. Mild aortic atherosclerosis. 3. Minimal chronic interstitial lung disease. Electronically Signed   By: Claudie Revering M.D.   On: 12/24/2015 08:45   Dg Chest 2 View  Result Date: 12/05/2015 CLINICAL DATA:  50 year old female with chest pain concerning for pneumonia. EXAM: CHEST  2 VIEW COMPARISON:  Chest radiograph dated 11/20/2015 FINDINGS: There is a small left pleural effusion, decreased in size compared to prior study. There is associated mild compressive atelectasis of the left lung base. Superimposed pneumonia is not excluded. Clinical correlation is recommended. The right lung is clear. There is no pneumothorax.  Right IJ dialysis catheter remains in stable positioning. There is stable mild cardiomegaly. No acute osseous pathology. IMPRESSION: Interval decrease in the size of the left pleural effusion. Stable mild cardiomegaly. Electronically Signed   By: Anner Crete M.D.   On: 12/05/2015 18:52   US Venous Img Upper Uni Left  Result Date: 12/07/2015 CLINICAL  DATA:  Left arm edema for 3 days EXAM: LEFT UPPER EXTREMITY VENOUS DUPLEX ULTRASOUND TECHNIQUE: Doppler venous assessment of the left lower extremity deep venous system was performed, including characterization of spectral flow, compressibility, and phasicity. COMPARISON:  None. FINDINGS: There is complete compressibility of the left jugular, subclavian, axillary, and brachial veins. Radial vein is compressible. Ulnar vein is poorly visualized but grossly compressible. There is no obvious cephalic or basilic vein thrombosis. Doppler analysis demonstrates a normal-appearing venous waveform. There is also augmentation with wrist compression. IMPRESSION: No evidence of left upper extremity DVT. No evidence of left jugular vein DVT. Electronically Signed   By: Marybelle Killings M.D.   On: 12/07/2015 09:28   Dg Chest Portable 1 View  Result Date: 12/25/2015 CLINICAL DATA:  Chest pain since last night. EXAM: PORTABLE CHEST 1 VIEW COMPARISON:  December 24, 2015 FINDINGS: The heart size and mediastinal contours are stable. Right central venous sinus unchanged distal tip in superior vena cava. There is no focal infiltrate, pulmonary edema, or pleural effusion. The visualized skeletal structures are stable. IMPRESSION: No active cardiopulmonary disease. Electronically Signed   By: Abelardo Diesel M.D.   On: 12/25/2015 15:54    Assessment:   Kimberly Montgomery is a 50 y.o. female with ESRD on HD but with a PD cath and Permacath in place admitted with CP and found to have Enterobacter bacteremia.  Fluid from PD did not appear infected and cx neg. Permacath has been removed.  PD cath does not appear infected. FU BCX negative.  She appears confused and perhaps uremic today and has had low grade temps. Some mild abd pain. Enterobacter sensitivities are pending. Allergic to PCN (Anaphylaxis), cephalosporins and bactrim  Recommendations Continue levofloxain pending sensitivities. Will need 14 day course of abx for this bacteremia Can replace HD cath if needed once bcx neg x 48 hours - repeat done 9/28 and ngtd If fevers recur would consider imaging of abdomen.   Thank you very much for allowing me to participate in the care of this patient. Please call with questions.   Cheral Marker. Ola Spurr, MD

## 2015-12-28 NOTE — Progress Notes (Signed)
Remains hemodynamically stable,contact precautions continue,peritoneal dialysis this evening,infectious disease consultation ordered.

## 2015-12-28 NOTE — Progress Notes (Signed)
CSW spoke to patient at bedside. Informed patient of bed offers [facility]) or [facility]. Patient reported that she'd "Just have to return to [facility].   CSW received phone call form patient's daughter. [name] reports that she refuses to allow her mother to return to [facility]. CSW presented bed offers. She reports that she's been speaking to [employer]. Stated Gala Romney informed her that he are unsure why patient was declined. Informed her that he had Medicaid beds and they he would revaluate patient. Per Gala Romney they aren't able to accept patient. [name] is agreeable for CSW to send LTC bed search to [place]. [name] wanted to know why patient was being declined. CSW informed her that it could be a multitude of reasons.   [facility] referral sent. Awaiting bed offers. CSW will continue to follow and assist.  Woodroe Mode, MSW, LCSW, LCAS-A Clinical Social Worker [phone]

## 2015-12-28 NOTE — Progress Notes (Signed)
[facility]  ROUNDING NOTE   Subjective:  PD fluid culture remains negative. Permcath taken out, appreciate vascular assistance.  Case discussed with Dr. Glenna Fellows.  We will plan to keep PD catheter in place at this time.   Objective:  Vital signs in last 24 hours:  Temp:  [97.8 F (36.6 C)-98.5 F (36.9 C)] 98.4 F (36.9 C) (09/28 0800) Pulse Rate:  [70-73] 70 (09/28 0932) Resp:  [18-20] 18 (09/28 0531) BP: (162-183)/(60-74) 162/60 (09/28 1012) SpO2:  [97 %-100 %] 98 % (09/28 0800) Weight:  [118.4 kg (261 lb 1.6 oz)] 118.4 kg (261 lb 1.6 oz) (09/28 0531)  Weight change: -0.166 kg (-5.9 oz) Filed Weights   12/26/15 1705 12/27/15 0518 12/28/15 0531  Weight: 118.3 kg (260 lb 12.9 oz) 117.9 kg (259 lb 14.4 oz) 118.4 kg (261 lb 1.6 oz)    Intake/Output: I/O last 3 completed shifts: In: 690 [P.O.:590; IV Piggyback:100] Out: 400 [Urine:400]   Intake/Output this shift:  Total I/O In: 50 [P.O.:50] Out: -   Physical Exam: General: No acute distress  Head: Normocephalic, atraumatic. Moist oral mucosal membranes  Eyes: Anicteric  Neck: Supple, trachea midline  Lungs:  Clear to auscultation, normal effort  Heart: S1S2 no rubs  Abdomen:  Soft, nontender, BS present, PD catheter in place  Extremities: RLE amputation noted  Neurologic: Nonfocal, moving all four extremities  Skin: No lesions  Access: PD cath in place    Basic Metabolic Panel:  Recent Labs Lab 12/24/15 0816 12/25/15 1434 12/26/15 0431  NA 132* 131* 131*  K 4.2 5.0 5.2*  CL 97* 96* 98*  CO2 30 28 26   GLUCOSE 115* 132* 186*  BUN 45* 64* 68*  CREATININE 4.51* 5.65* 6.05*  CALCIUM 9.2 9.3 9.1    Liver Function Tests:  Recent Labs Lab 12/25/15 1434  AST 17  ALT 9*  ALKPHOS 66  BILITOT 0.5  PROT 5.0*  ALBUMIN 2.2*   No results for input(s): LIPASE, AMYLASE in the last 168 hours. No results for input(s): AMMONIA in the last 168 hours.  CBC:  Recent Labs Lab 12/24/15 0816  12/25/15 1434 12/26/15 0431 12/27/15 1347  WBC 6.6 11.0 11.0 6.3  NEUTROABS  --  9.6*  --   --   HGB 9.6* 7.7* 7.4* 7.1*  HCT 28.1* 23.5* 22.5* 21.4*  MCV 94.3 95.2 96.4 94.3  PLT 299 226 207 200    Cardiac Enzymes:  Recent Labs Lab 12/24/15 1206 12/25/15 1434 12/25/15 2036 12/26/15 0120 12/26/15 0431  TROPONINI 0.03* 0.66* 1.01* 0.63* 0.54*    BNP: Invalid input(s): POCBNP  CBG:  Recent Labs Lab 12/27/15 0821 12/27/15 1124 12/27/15 1627 12/27/15 2112 12/28/15 0715  GLUCAP 113* 142* 159* 159* 120*    Microbiology: Results for orders placed or performed during the hospital encounter of 12/25/15  Blood culture (routine x 2)     Status: None (Preliminary result)   Collection Time: 12/25/15  2:32 PM  Result Value Ref Range Status   Specimen Description BLOOD  L HAND  Final   Special Requests   Final    BOTTLES DRAWN AEROBIC AND ANAEROBIC  AER 12 ML ANA 14 ML   Culture  Setup Time   Final    GRAM NEGATIVE RODS IN BOTH AEROBIC AND ANAEROBIC BOTTLES CRITICAL VALUE NOTED.  VALUE IS CONSISTENT WITH PREVIOUSLY REPORTED AND CALLED VALUE. SDR    Culture   Final    GRAM NEGATIVE RODS IDENTIFICATION AND SUSCEPTIBILITIES TO FOLLOW Performed at [facility]  [facility]    Report Status PENDING  Incomplete  Blood culture (routine x 2)     Status: Abnormal (Preliminary result)   Collection Time: 12/25/15  2:32 PM  Result Value Ref Range Status   Specimen Description BLOOD  L FA  Final   Special Requests   Final    BOTTLES DRAWN AEROBIC AND ANAEROBIC  AER 12 ML ANA 12 ML   Culture  Setup Time   Final    GRAM NEGATIVE RODS IN BOTH AEROBIC AND ANAEROBIC BOTTLES CRITICAL RESULT CALLED TO, READ BACK BY AND VERIFIED WITH: NATE COOKSON AT 0535 12/26/15 SDR    Culture (A)  Final    ESCHERICHIA COLI SUSCEPTIBILITIES TO FOLLOW Performed at [facility]    Report Status PENDING  Incomplete  Blood Culture ID Panel (Reflexed)     Status: Abnormal   Collection Time:  12/25/15  2:32 PM  Result Value Ref Range Status   Enterococcus species NOT DETECTED NOT DETECTED Final   Listeria monocytogenes NOT DETECTED NOT DETECTED Final   Staphylococcus species NOT DETECTED NOT DETECTED Final   Staphylococcus aureus NOT DETECTED NOT DETECTED Final   Streptococcus species NOT DETECTED NOT DETECTED Final   Streptococcus agalactiae NOT DETECTED NOT DETECTED Final   Streptococcus pneumoniae NOT DETECTED NOT DETECTED Final   Streptococcus pyogenes NOT DETECTED NOT DETECTED Final   Acinetobacter baumannii NOT DETECTED NOT DETECTED Final   Enterobacteriaceae species DETECTED (A) NOT DETECTED Final    Comment: CRITICAL RESULT CALLED TO, READ BACK BY AND VERIFIED WITH:  NATE COOKSON AT 0535 12/26/15 SDR    Enterobacter cloacae complex NOT DETECTED NOT DETECTED Final   Escherichia coli DETECTED (A) NOT DETECTED Final    Comment: CRITICAL RESULT CALLED TO, READ BACK BY AND VERIFIED WITH:  NATE COOKSON AT 0535 12/26/15 SDR    Klebsiella oxytoca NOT DETECTED NOT DETECTED Final   Klebsiella pneumoniae NOT DETECTED NOT DETECTED Final   Proteus species NOT DETECTED NOT DETECTED Final   Serratia marcescens NOT DETECTED NOT DETECTED Final   Carbapenem resistance NOT DETECTED NOT DETECTED Final   Haemophilus influenzae NOT DETECTED NOT DETECTED Final   Neisseria meningitidis NOT DETECTED NOT DETECTED Final   Pseudomonas aeruginosa NOT DETECTED NOT DETECTED Final   Candida albicans NOT DETECTED NOT DETECTED Final   Candida glabrata NOT DETECTED NOT DETECTED Final   Candida krusei NOT DETECTED NOT DETECTED Final   Candida parapsilosis NOT DETECTED NOT DETECTED Final   Candida tropicalis NOT DETECTED NOT DETECTED Final  Culture, body fluid-bottle     Status: None (Preliminary result)   Collection Time: 12/25/15  4:00 PM  Result Value Ref Range Status   Specimen Description FLUID PERITONEAL DIALYSATE  Final   Special Requests BOTTLES DRAWN AEROBIC AND ANAEROBIC 10CC  Final    Culture   Final    NO GROWTH 2 DAYS Performed at [facility]    Report Status PENDING  Incomplete  Gram stain     Status: None   Collection Time: 12/25/15  4:00 PM  Result Value Ref Range Status   Specimen Description FLUID PERITONEAL DIALYSATE  Final   Special Requests NONE  Final   Gram Stain   Final    FEW WBC PRESENT,BOTH PMN AND MONONUCLEAR NO ORGANISMS SEEN Performed at [facility]    Report Status 12/27/2015 FINAL  Final  MRSA PCR Screening     Status: Abnormal   Collection Time: 12/26/15  1:34 PM  Result Value Ref  Range Status   MRSA by PCR POSITIVE (A) NEGATIVE Final    Comment:        The GeneXpert MRSA Assay (FDA approved for NASAL specimens only), is one component of a comprehensive MRSA colonization surveillance program. It is not intended to diagnose MRSA infection nor to guide or monitor treatment for MRSA infections. RESULT CALLED TO, READ BACK BY AND VERIFIED WITH: AMY TODD RN AT 1455 12/26/15 MSS.   Gram stain     Status: None   Collection Time: 12/27/15  4:15 PM  Result Value Ref Range Status   Specimen Description PERITONEAL DIALYSATE  Final   Special Requests NONE  Final   Gram Stain   Final    WBC PRESENT,BOTH PMN AND MONONUCLEAR NO ORGANISMS SEEN CYTOSPIN Performed at [facility]    Report Status 12/27/2015 FINAL  Final    Coagulation Studies:  Recent Labs  12/25/15 1630  LABPROT 13.5  INR 1.03    Urinalysis:  Recent Labs  12/25/15 1432  COLORURINE YELLOW*  LABSPEC 1.019  PHURINE 5.0  GLUCOSEU 150*  HGBUR NEGATIVE  BILIRUBINUR NEGATIVE  KETONESUR NEGATIVE  PROTEINUR >500*  NITRITE NEGATIVE  LEUKOCYTESUR NEGATIVE      Imaging: No results found.   Medications:     . calcium acetate  2,001 mg Oral TID WC  . calcium carbonate  1 tablet Oral TID WC  . clopidogrel  75 mg Oral Daily  . colesevelam  625 mg Oral BID WC  . docusate sodium  100 mg Oral QHS  . feeding supplement (PRO-STAT SUGAR  FREE 64)  30 mL Oral TID WC  . heparin subcutaneous  5,000 Units Subcutaneous Q8H  . hydrALAZINE  25 mg Oral Daily  . insulin aspart  0-10 Units Subcutaneous TID WC  . insulin glargine  8 Units Subcutaneous QHS  . isosorbide mononitrate  60 mg Oral Daily  . lactulose  10 g Oral Daily  . levETIRAcetam  500 mg Oral q morning - 10a  . levofloxacin (LEVAQUIN) IV  500 mg Intravenous Q48H  . lisinopril  40 mg Oral Daily  . metoprolol  50 mg Oral BID  . mupirocin ointment  1 application Nasal BID  . nicotine  14 mg Transdermal Daily  . nystatin  1 g Topical BID  . pantoprazole  40 mg Oral Daily  . polyethylene glycol  17 g Oral Daily  . ranolazine  500 mg Oral BID  . risperiDONE  1 mg Oral TID  . senna  2 tablet Oral BID  . sodium chloride flush  3 mL Intravenous Q12H  . torsemide  100 mg Oral Daily  . white petrolatum   Topical Q72H   acetaminophen **OR** acetaminophen, bisacodyl, metolazone, nitroGLYCERIN, ondansetron **OR** ondansetron (ZOFRAN) IV  Assessment/ Plan:  50 y.o. female with end-stage renal disease on hemodialysis, asthma, gastroparesis, diabetes mellitus type II, congestive heart failure, hypertension, endometriosis, coronary atherosclerosis  [facility]/ [address]/ TTS RIJ permcath  1. End-stage renal disease on hemodialysis dialysis, TTHS: Pt currently permcath free, PD fluid cultures negative, pt currently on levaquin.  We will start the patient on PD given lack of hemodialysis access.   2. Anemia of CKD: hgb yesterday was 7.1, consider blood transfusion for any further drop in hgb.   3. Sepsis: 2 blood cultures are positive.  Enterobacter and Escherichia coli noted.  Consult ID.  Permcath removed, PD catheter doesn't appear infected based on culture and cell count diff, will leave PD cath  in place, continue levofloxacin, await further ID input.   4. Secondary Hyperparathyroidism:  Patient taken off of calcitriol as PTH was previously low.  Continue  binder therapy otherwise.   LOS: 1 Deloma Spindle 9/28/201710:49 AM

## 2015-12-28 NOTE — Progress Notes (Signed)
PD started 

## 2015-12-28 NOTE — Care Management (Signed)
Patient had perm cath removed 9.27.  Plan to keep PD cath for now.  IV antibiotics.  Requested physical therapy consult.  Patient will require authorization from her insurance company to return to her skilled facility

## 2015-12-28 NOTE — Progress Notes (Signed)
PHARMACY - PHYSICIAN COMMUNICATION CRITICAL VALUE ALERT - BLOOD CULTURE IDENTIFICATION (BCID)  Results for orders placed or performed during the hospital encounter of 12/25/15  Blood Culture ID Panel (Reflexed) (Collected: 12/25/2015  2:32 PM)  Result Value Ref Range   Enterococcus species NOT DETECTED NOT DETECTED   Listeria monocytogenes NOT DETECTED NOT DETECTED   Staphylococcus species NOT DETECTED NOT DETECTED   Staphylococcus aureus NOT DETECTED NOT DETECTED   Streptococcus species NOT DETECTED NOT DETECTED   Streptococcus agalactiae NOT DETECTED NOT DETECTED   Streptococcus pneumoniae NOT DETECTED NOT DETECTED   Streptococcus pyogenes NOT DETECTED NOT DETECTED   Acinetobacter baumannii NOT DETECTED NOT DETECTED   Enterobacteriaceae species DETECTED (A) NOT DETECTED   Enterobacter cloacae complex DETECTED (A) NOT DETECTED   Escherichia coli NOT DETECTED NOT DETECTED   Klebsiella oxytoca NOT DETECTED NOT DETECTED   Klebsiella pneumoniae NOT DETECTED NOT DETECTED   Proteus species NOT DETECTED NOT DETECTED   Serratia marcescens NOT DETECTED NOT DETECTED   Carbapenem resistance NOT DETECTED NOT DETECTED   Haemophilus influenzae NOT DETECTED NOT DETECTED   Neisseria meningitidis NOT DETECTED NOT DETECTED   Pseudomonas aeruginosa NOT DETECTED NOT DETECTED   Candida albicans NOT DETECTED NOT DETECTED   Candida glabrata NOT DETECTED NOT DETECTED   Candida krusei NOT DETECTED NOT DETECTED   Candida parapsilosis NOT DETECTED NOT DETECTED   Candida tropicalis NOT DETECTED NOT DETECTED  Called [facility] Microlab and confirmed carbapenem resistance NOT detected.    Name of physician (or Provider) Contacted: Dr. Luberta Mutter (text paged)   Changes to prescribed antibiotics required: Recommend continuing levaquin at this time  Marty Heck 12/28/2015  12:26 PM

## 2015-12-28 NOTE — Progress Notes (Signed)
SUBJECTIVE: Patient is having pain in the coccyx area but no chest pain or shortness of breath   Vitals:   12/27/15 1124 12/27/15 2027 12/28/15 0531 12/28/15 0800  BP: (!) 162/69 (!) 165/66 (!) 170/71   Pulse: 71 70 73   Resp: 20 18 18    Temp: 97.9 F (36.6 C) 98.5 F (36.9 C) 97.8 F (36.6 C)   TempSrc: Oral Oral Oral   SpO2: 99% 100% 97% 98%  Weight:   261 lb 1.6 oz (118.4 kg)   Height:        Intake/Output Summary (Last 24 hours) at 12/28/15 0833 Last data filed at 12/28/15 0831  Gross per 24 hour  Intake              740 ml  Output              400 ml  Net              340 ml    LABS: Basic Metabolic Panel:  Recent Labs  03/24/81 1434 12/26/15 0431  NA 131* 131*  K 5.0 5.2*  CL 96* 98*  CO2 28 26  GLUCOSE 132* 186*  BUN 64* 68*  CREATININE 5.65* 6.05*  CALCIUM 9.3 9.1   Liver Function Tests:  Recent Labs  12/25/15 1434  AST 17  ALT 9*  ALKPHOS 66  BILITOT 0.5  PROT 5.0*  ALBUMIN 2.2*   No results for input(s): LIPASE, AMYLASE in the last 72 hours. CBC:  Recent Labs  12/25/15 1434 12/26/15 0431 12/27/15 1347  WBC 11.0 11.0 6.3  NEUTROABS 9.6*  --   --   HGB 7.7* 7.4* 7.1*  HCT 23.5* 22.5* 21.4*  MCV 95.2 96.4 94.3  PLT 226 207 200   Cardiac Enzymes:  Recent Labs  12/25/15 2036 12/26/15 0120 12/26/15 0431  TROPONINI 1.01* 0.63* 0.54*   BNP: Invalid input(s): POCBNP D-Dimer: No results for input(s): DDIMER in the last 72 hours. Hemoglobin A1C: No results for input(s): HGBA1C in the last 72 hours. Fasting Lipid Panel: No results for input(s): CHOL, HDL, LDLCALC, TRIG, CHOLHDL, LDLDIRECT in the last 72 hours. Thyroid Function Tests: No results for input(s): TSH, T4TOTAL, T3FREE, THYROIDAB in the last 72 hours.  Invalid input(s): FREET3 Anemia Panel: No results for input(s): VITAMINB12, FOLATE, FERRITIN, TIBC, IRON, RETICCTPCT in the last 72 hours.   PHYSICAL EXAM General: Well developed, well nourished, in no acute  distress HEENT:  Normocephalic and atramatic Neck:  No JVD.  Lungs: Clear bilaterally to auscultation and percussion. Heart: HRRR . Normal S1 and S2 without gallops or murmurs.  Abdomen: Bowel sounds are positive, abdomen soft and non-tender  Msk:  Back normal, normal gait. Normal strength and tone for age. Extremities: No clubbing, cyanosis or edema.   Neuro: Alert and oriented X 3. Psych:  Good affect, responds appropriately  TELEMETRY: Patient patient is in sinus rhythm  ASSESSMENT AND PLAN: Atypical chest pain with mildly elevated troponin and sepsis with Escherichia coli. Will not do any extensive cardiac workup on this admission and will do outpatient testing. Due to sepsis will hold off any further cardiac workup.  Active Problems:   Chest pain   Bacteremia    Laurier Nancy, MD, [facility] 12/28/2015 8:33 AM

## 2015-12-28 NOTE — Progress Notes (Signed)
Patient ID: Kimberly Montgomery, female   DOB: Jun 21, 1965, 50 y.o.   MRN: [id number]   [employer] [facility] [place] [facility]   PATIENT NAME: Kimberly Montgomery    MR#:  [id number]  DATE OF BIRTH:  Nov 26, 1965  SUBJECTIVE:  Pt had fever, chills ,blood cultures are positive for Escherichia coli from September 25. s/ post removal of permacath. Constipation today   CHIEF COMPLAINT:   Chief Complaint  Patient presents with  . Chest Pain  50 year old female with past medical history of end-stage renal disease on hemodialysis, hypertension, hyperlipidemia, peripheral vascular disease, diabetes, history of coronary artery disease who Was admitted to the hospital due to chest pain. Today she states that her pain is completely resolved. She denies any chest pain, shortness of breath, nausea, vomiting, palpitations. Cardiology consultation and recommendations appreciated.   REVIEW OF SYSTEMS:  ROS CONSTITUTIONAL: No fever/chills, fatigue, weakness, weight gain/loss, headache. EYES: No blurry or double vision. ENT: No tinnitus, postnasal drip, redness or soreness of the oropharynx. RESPIRATORY: No cough, dyspnea, wheeze, hemoptysis.  CARDIOVASCULAR: No chest pain, palpitations, syncope, orthopnea,  GASTROINTESTINAL: No nausea, vomiting, constipation, diarrhea, abdominal pain, hematemesis, melena or hematochezia. GENITOURINARY: No dysuria, frequency, hematuria. ENDOCRINE: No polyuria or nocturia. No heat or cold intolerance. HEMATOLOGY: No anemia, bruising, bleeding. INTEGUMENTARY: No rashes, ulcers, lesions. MUSCULOSKELETAL: No arthritis, gout, edema. NEUROLOGIC: No numbness, tingling, ataxia, seizure-type activity, weakness. PSYCHIATRIC: No anxiety, depression, insomnia.   DRUG ALLERGIES:   Allergies  Allergen Reactions  . Cephalosporins Anaphylaxis  . Penicillins Anaphylaxis and Other (See Comments)    Has patient had a PCN reaction causing immediate rash, facial/tongue/throat  swelling, SOB or lightheadedness with hypotension: Yes Has patient had a PCN reaction causing severe rash involving mucus membranes or skin necrosis: No Has patient had a PCN reaction that required hospitalization No Has patient had a PCN reaction occurring within the last 10 years: No If all of the above answers are "NO", then may proceed with Cephalosporin use.  . Lamictal [Lamotrigine] Other (See Comments)    Reaction:  Hallucinations  . Phenergan [Promethazine Hcl] Nausea And Vomiting  . Pravastatin Other (See Comments)    Reaction:  Muscle pain   . Sulfa Antibiotics Other (See Comments)    Reaction:  Unknown    VITALS:  Blood pressure (!) 157/57, pulse 66, temperature 98.4 F (36.9 C), resp. rate 16, height 5\' 10"  (1.778 m), weight 118.4 kg (261 lb 1.6 oz), last menstrual period 03/16/2015, SpO2 98 %. PHYSICAL EXAMINATION:  Physical Exam  GENERAL:  50 y.o.-year-old white female patient lying in the bed in no acute distress.  EYES: Pupils equal, round, reactive to light and accommodation. No scleral icterus. Extraocular muscles intact.  HEENT: Head atraumatic, normocephalic. Oropharynx and nasopharynx clear. No oropharyngeal erythema, moist oral mucosa  NECK:  Supple, no jugular venous distention. No thyroid enlargement, no tenderness.  LUNGS: Normal breath sounds bilaterally, no wheezing, rales, rhonchi. No use of accessory muscles of respiration.  CARDIOVASCULAR: S1, S2 RRR. No murmurs, rubs, gallops, clicks. Right chest wall dialysis catheter in place ABDOMEN: Soft, nontender, nondistended. Bowel sounds present. No organomegaly or mass. Positive peritoneal dialysis catheter in place without acute drainage. EXTREMITIES: No pedal edema, cyanosis, or clubbing. + 2 pedal & radial pulses b/l.   NEUROLOGIC: Cranial nerves II through XII are intact. No focal Motor or sensory deficits appreciated b/l.  Globally weak.   PSYCHIATRIC: The patient is alert and oriented x 2.  SKIN: No obvious  rash, lesion, or  ulcer.    LABORATORY PANEL:   CBC  Recent Labs Lab 12/27/15 1347  WBC 6.3  HGB 7.1*  HCT 21.4*  PLT 200   ------------------------------------------------------------------------------------------------------------------ Chemistries   Recent Labs Lab 12/25/15 1434 12/26/15 0431  NA 131* 131*  K 5.0 5.2*  CL 96* 98*  CO2 28 26  GLUCOSE 132* 186*  BUN 64* 68*  CREATININE 5.65* 6.05*  CALCIUM 9.3 9.1  AST 17  --   ALT 9*  --   ALKPHOS 66  --   BILITOT 0.5  --    RADIOLOGY:  No results found. ASSESSMENT AND PLAN:   1. Chest pain with elevated troponin which is trending down. 1.01-0.63-0.54. Likely secondary to bacteremia: No chest pain today. No further workup . 2. Sepsis - blood cultures positive for enterococcus  Continue Levaquin and follow-up sensitivities. Urinalysis and chest x-ray have been negative. Patient has permacath  removed, Peritoneal fluid cultures are negative to date. Slightly elevated troponin secondary to demand ischemia rather than acute MI. Seen by cardiology. 2. End-stage renal disease on hemodialysis-patient gets scheduled dialysis on Tuesday Thursday Saturday.  3. Diabetes type 2 without complication-continue Lantus, sliding scale insulin.  4. Essential hypertension-continue lisinopril, metoprolol, Imdur.  5. History of seizures-continue Keppra.  6. GERD-continue Protonix.  7. Secondary hyperparathyroidism-continue calcium acetate.  #8. Chronic deconditioning: Patient says that she is bedbound    #9 constipation: Continue stool softeners.. Discussed with patient, appreciate presence of  RN  during rounds.   All the records are reviewed and case discussed with Care Management/Social Worker. Management plans discussed with the patient, family and they are in agreement.  CODE STATUS: FULL  TOTAL TIME TAKING CARE OF THIS PATIENT: 25 minutes.   More than 50% of the time was spent in counseling/coordination of  care: YES  POSSIBLE D/C IN 1-2 DAYS, DEPENDING ON CLINICAL CONDITION.   Katha HammingKONIDENA,Ryliegh Mcduffey M.D on 12/28/2015 at 2:28 PM  Between 7am to 6pm - Pager - [phone]  After 6pm go to [email or web address] - Social research officer, governmentpassword [email or web address] [email or web address]  [facility]  Office  [phone]  CC: Primary care physician; Jarome MatinALE, PATRICK D, MD  Note: This dictation was prepared with Dragon dictation along with smaller phrase technology. Any transcriptional errors that result from this process are unintentional.

## 2015-12-28 NOTE — Progress Notes (Signed)
BCID correction called from [facility] lab 9/28 at 12:03.  Called information to Crist Fat,  Clinical Pharmacist 9/28 at 12:15.   BCID originally reported as +E.coli. Corrected BCID reported as Industrial/product designer.    Bari Mantis PharmD Clinical Pharmacist 12/28/2015

## 2015-12-29 LAB — BASIC METABOLIC PANEL
Anion gap: 7 (ref 5–15)
BUN: 66 mg/dL — ABNORMAL HIGH (ref 6–20)
CALCIUM: 10.2 mg/dL (ref 8.9–10.3)
CO2: 30 mmol/L (ref 22–32)
CREATININE: 5.9 mg/dL — AB (ref 0.44–1.00)
Chloride: 95 mmol/L — ABNORMAL LOW (ref 101–111)
GFR calc Af Amer: 9 mL/min — ABNORMAL LOW (ref >60)
GFR, EST NON AFRICAN AMERICAN: 7 mL/min — AB (ref >60)
Glucose, Bld: 198 mg/dL — ABNORMAL HIGH (ref 65–99)
Potassium: 5 mmol/L (ref 3.5–5.1)
SODIUM: 132 mmol/L — AB (ref 135–145)

## 2015-12-29 LAB — GLUCOSE, CAPILLARY
GLUCOSE-CAPILLARY: 225 mg/dL — AB (ref 65–99)
Glucose-Capillary: 220 mg/dL — ABNORMAL HIGH (ref 65–99)
Glucose-Capillary: 191 mg/dL — ABNORMAL HIGH (ref 65–99)
Glucose-Capillary: 141 mg/dL — ABNORMAL HIGH (ref 65–99)
Glucose-Capillary: 165 mg/dL — ABNORMAL HIGH (ref 65–99)

## 2015-12-29 LAB — CBC
HEMATOCRIT: 22.2 % — AB (ref 35.0–47.0)
HEMOGLOBIN: 7.5 g/dL — AB (ref 12.0–16.0)
MCH: 30.9 pg (ref 26.0–34.0)
MCHC: 33.7 g/dL (ref 32.0–36.0)
MCV: 91.8 fL (ref 80.0–100.0)
PLATELETS: 264 10*3/uL (ref 150–440)
RBC: 2.42 MIL/uL — ABNORMAL LOW (ref 3.80–5.20)
RDW: 14.1 % (ref 11.5–14.5)
WBC: 6 10*3/uL (ref 3.6–11.0)

## 2015-12-29 LAB — PATHOLOGIST SMEAR REVIEW: Path Review: NEGATIVE

## 2015-12-29 MED ORDER — OXYCODONE-ACETAMINOPHEN 5-325 MG PO TABS
1.0000 | ORAL_TABLET | Freq: Four times a day (QID) | ORAL | Status: DC | PRN
  Administered 2015-12-29 – 2016-01-04 (×12): 1 via ORAL
  Filled 2015-12-29 (×13): qty 1

## 2015-12-29 MED ORDER — MORPHINE SULFATE (PF) 2 MG/ML IV SOLN
2.0000 mg | Freq: Once | INTRAVENOUS | Status: AC
  Administered 2015-12-29: 2 mg via INTRAVENOUS
  Filled 2015-12-29 (×3): qty 1

## 2015-12-29 MED ORDER — METOPROLOL TARTRATE 5 MG/5ML IV SOLN: 5.0000 mg | INTRAVENOUS | Status: DC | PRN

## 2015-12-29 NOTE — Progress Notes (Signed)
Pt disconnected from PD cycler. Tolerated tx well. Total UF 869.

## 2015-12-29 NOTE — Progress Notes (Signed)
Presented bed offers to patient's daughter. She reports she's going to tour [employer] and that she'll call CSW back. Will continue to follow and assist.  Woodroe Mode, MSW, LCSW, LCAS-A Clinical Social Worker [phone]

## 2015-12-29 NOTE — Progress Notes (Signed)
[facility]  ROUNDING NOTE   Subjective:  Patient tolerated peritoneal dialysis quite well last night. Overall remains quite weakand in bed. Poor overall functional status noted.  Objective:  Vital signs in last 24 hours:  Temp:  [98.2 F (36.8 C)-100.2 F (37.9 C)] 98.9 F (37.2 C) (09/29 1204) Pulse Rate:  [79-117] 85 (09/29 1204) Resp:  [16-20] 16 (09/29 1204) BP: (125-187)/(58-75) 172/60 (09/29 1204) SpO2:  [94 %-99 %] 99 % (09/29 1204) Weight:  [119.1 kg (262 lb 8 oz)] 119.1 kg (262 lb 8 oz) (09/29 0324)  Weight change: 0.635 kg (1 lb 6.4 oz) Filed Weights   12/27/15 0518 12/28/15 0531 12/29/15 0324  Weight: 117.9 kg (259 lb 14.4 oz) 118.4 kg (261 lb 1.6 oz) 119.1 kg (262 lb 8 oz)    Intake/Output: I/O last 3 completed shifts: In: 550 [P.O.:450; IV Piggyback:100] Out: 450 [Urine:450]   Intake/Output this shift:  Total I/O In: -  Out: 869 [Other:869]  Physical Exam: General: No acute distress  Head: Normocephalic, atraumatic. Moist oral mucosal membranes  Eyes: Anicteric  Neck: Supple, trachea midline  Lungs:  Clear to auscultation, normal effort  Heart: S1S2 no rubs  Abdomen:  Soft, nontender, BS present, PD catheter in place  Extremities: RLE amputation noted  Neurologic: Nonfocal, moving all four extremities  Skin: No lesions  Access: PD cath in place    Basic Metabolic Panel:  Recent Labs Lab 12/24/15 0816 12/25/15 1434 12/26/15 0431 12/28/15 1706 12/29/15 0425  NA 132* 131* 131*  --  132*  K 4.2 5.0 5.2*  --  5.0  CL 97* 96* 98*  --  95*  CO2 30 28 26   --  30  GLUCOSE 115* 132* 186*  --  198*  BUN 45* 64* 68*  --  66*  CREATININE 4.51* 5.65* 6.05*  --  5.90*  CALCIUM 9.2 9.3 9.1  --  10.2  PHOS  --   --   --  3.7  --     Liver Function Tests:  Recent Labs Lab 12/25/15 1434  AST 17  ALT 9*  ALKPHOS 66  BILITOT 0.5  PROT 5.0*  ALBUMIN 2.2*   No results for input(s): LIPASE, AMYLASE in the last 168 hours. No  results for input(s): AMMONIA in the last 168 hours.  CBC:  Recent Labs Lab 12/24/15 0816 12/25/15 1434 12/26/15 0431 12/27/15 1347 12/29/15 0425  WBC 6.6 11.0 11.0 6.3 6.0  NEUTROABS  --  9.6*  --   --   --   HGB 9.6* 7.7* 7.4* 7.1* 7.5*  HCT 28.1* 23.5* 22.5* 21.4* 22.2*  MCV 94.3 95.2 96.4 94.3 91.8  PLT 299 226 207 200 264    Cardiac Enzymes:  Recent Labs Lab 12/24/15 1206 12/25/15 1434 12/25/15 2036 12/26/15 0120 12/26/15 0431  TROPONINI 0.03* 0.66* 1.01* 0.63* 0.54*    BNP: Invalid input(s): POCBNP  CBG:  Recent Labs Lab 12/28/15 1703 12/28/15 2106 12/29/15 0325 12/29/15 0740 12/29/15 1159  GLUCAP 134* 232* 225* 220* 191*    Microbiology: Results for orders placed or performed during the hospital encounter of 12/25/15  Blood culture (routine x 2)     Status: Abnormal (Preliminary result)   Collection Time: 12/25/15  2:32 PM  Result Value Ref Range Status   Specimen Description BLOOD  L HAND  Final   Special Requests   Final    BOTTLES DRAWN AEROBIC AND ANAEROBIC  AER 12 ML ANA 14 ML   Culture  Setup  Time   Final    GRAM NEGATIVE RODS IN BOTH AEROBIC AND ANAEROBIC BOTTLES CRITICAL VALUE NOTED.  VALUE IS CONSISTENT WITH PREVIOUSLY REPORTED AND CALLED VALUE. SDR    Culture ENTEROBACTER CLOACAE (A)  Final   Report Status PENDING  Incomplete  Blood culture (routine x 2)     Status: Abnormal (Preliminary result)   Collection Time: 12/25/15  2:32 PM  Result Value Ref Range Status   Specimen Description BLOOD  L FA  Final   Special Requests   Final    BOTTLES DRAWN AEROBIC AND ANAEROBIC  AER 12 ML ANA 12 ML   Culture  Setup Time   Final    GRAM NEGATIVE RODS IN BOTH AEROBIC AND ANAEROBIC BOTTLES CRITICAL RESULT CALLED TO, READ BACK BY AND VERIFIED WITH: NATE COOKSON AT 0535 12/26/15 SDR    Culture (A)  Final    ENTEROBACTER CLOACAE REPEATING SENSITIVITIES Performed at [facility]    Report Status PENDING  Incomplete  Blood Culture ID  Panel (Reflexed)     Status: Abnormal   Collection Time: 12/25/15  2:32 PM  Result Value Ref Range Status   Enterococcus species NOT DETECTED NOT DETECTED Final   Listeria monocytogenes NOT DETECTED NOT DETECTED Final   Staphylococcus species NOT DETECTED NOT DETECTED Final   Staphylococcus aureus NOT DETECTED NOT DETECTED Final   Streptococcus species NOT DETECTED NOT DETECTED Final   Streptococcus agalactiae NOT DETECTED NOT DETECTED Final   Streptococcus pneumoniae NOT DETECTED NOT DETECTED Final   Streptococcus pyogenes NOT DETECTED NOT DETECTED Final   Acinetobacter baumannii NOT DETECTED NOT DETECTED Final   Enterobacteriaceae species DETECTED (A) NOT DETECTED Final    Comment: CRITICAL RESULT CALLED TO, READ BACK BY AND VERIFIED WITH:  NATE COOKSON AT 0535 12/26/15 SDR    Enterobacter cloacae complex DETECTED (A) NOT DETECTED Corrected    Comment: CRITICAL RESULT CALLED TO, READ BACK BY AND VERIFIED WITH: K MERRILL 12/28/15 @ 1210 M VESTAL CORRECTED ON 09/28 AT 1211: PREVIOUSLY REPORTED AS NOT DETECTED    Escherichia coli NOT DETECTED NOT DETECTED Corrected    Comment: PREVIOUSLY REPORTED AS: DETECTED CORRECTED RESULTS CALLED TO: K MERRILL 12/28/15 @ 1210 M VESTAL Performed at [facility] CORRECTED ON 09/28 AT 1211: PREVIOUSLY REPORTED AS DETECTED CRITICAL RESULT CALLED TO, READ BACK BY AND VERIFIED WITH:  NATE COOKSON AT 0535 12/26/15 SDR    Klebsiella oxytoca NOT DETECTED NOT DETECTED Final   Klebsiella pneumoniae NOT DETECTED NOT DETECTED Final   Proteus species NOT DETECTED NOT DETECTED Final   Serratia marcescens NOT DETECTED NOT DETECTED Final   Carbapenem resistance NOT DETECTED NOT DETECTED Final   Haemophilus influenzae NOT DETECTED NOT DETECTED Final   Neisseria meningitidis NOT DETECTED NOT DETECTED Final   Pseudomonas aeruginosa NOT DETECTED NOT DETECTED Final   Candida albicans NOT DETECTED NOT DETECTED Final   Candida glabrata NOT DETECTED NOT DETECTED  Final   Candida krusei NOT DETECTED NOT DETECTED Final   Candida parapsilosis NOT DETECTED NOT DETECTED Final   Candida tropicalis NOT DETECTED NOT DETECTED Final  Culture, body fluid-bottle     Status: None (Preliminary result)   Collection Time: 12/25/15  4:00 PM  Result Value Ref Range Status   Specimen Description FLUID PERITONEAL DIALYSATE  Final   Special Requests BOTTLES DRAWN AEROBIC AND ANAEROBIC 10CC  Final   Culture   Final    NO GROWTH 3 DAYS Performed at [facility]    Report Status PENDING  Incomplete  Gram stain     Status: None   Collection Time: 12/25/15  4:00 PM  Result Value Ref Range Status   Specimen Description FLUID PERITONEAL DIALYSATE  Final   Special Requests NONE  Final   Gram Stain   Final    FEW WBC PRESENT,BOTH PMN AND MONONUCLEAR NO ORGANISMS SEEN Performed at [facility]    Report Status 12/27/2015 FINAL  Final  MRSA PCR Screening     Status: Abnormal   Collection Time: 12/26/15  1:34 PM  Result Value Ref Range Status   MRSA by PCR POSITIVE (A) NEGATIVE Final    Comment:        The GeneXpert MRSA Assay (FDA approved for NASAL specimens only), is one component of a comprehensive MRSA colonization surveillance program. It is not intended to diagnose MRSA infection nor to guide or monitor treatment for MRSA infections. RESULT CALLED TO, READ BACK BY AND VERIFIED WITH: AMY TODD RN AT 1455 12/26/15 MSS.   Culture, body fluid-bottle     Status: None (Preliminary result)   Collection Time: 12/27/15  4:15 PM  Result Value Ref Range Status   Specimen Description PERITONEAL DIALYSATE  Final   Special Requests NONE  Final   Culture   Final    NO GROWTH < 24 HOURS Performed at [facility]    Report Status PENDING  Incomplete  Gram stain     Status: None   Collection Time: 12/27/15  4:15 PM  Result Value Ref Range Status   Specimen Description PERITONEAL DIALYSATE  Final   Special Requests NONE  Final   Gram Stain    Final    WBC PRESENT,BOTH PMN AND MONONUCLEAR NO ORGANISMS SEEN CYTOSPIN Performed at [facility]    Report Status 12/27/2015 FINAL  Final  CULTURE, BLOOD (ROUTINE X 2) w Reflex to ID Panel     Status: None (Preliminary result)   Collection Time: 12/28/15  7:19 PM  Result Value Ref Range Status   Specimen Description BLOOD RIGHT AC  Final   Special Requests BOTTLES DRAWN AEROBIC AND ANAEROBIC 10 ML  Final   Culture NO GROWTH < 12 HOURS  Final   Report Status PENDING  Incomplete    Coagulation Studies: No results for input(s): LABPROT, INR in the last 72 hours.  Urinalysis: No results for input(s): COLORURINE, LABSPEC, PHURINE, GLUCOSEU, HGBUR, BILIRUBINUR, KETONESUR, PROTEINUR, UROBILINOGEN, NITRITE, LEUKOCYTESUR in the last 72 hours.  Invalid input(s): APPERANCEUR    Imaging: No results found.   Medications:     . calcium acetate  2,001 mg Oral TID WC  . calcium carbonate  1 tablet Oral TID WC  . clopidogrel  75 mg Oral Daily  . colesevelam  625 mg Oral BID WC  . dialysis solution 2.5% low-MG/low-CA   Intraperitoneal Q24H  . docusate sodium  100 mg Oral QHS  . feeding supplement (PRO-STAT SUGAR FREE 64)  30 mL Oral TID WC  . gentamicin cream  1 application Topical Daily  . heparin subcutaneous  5,000 Units Subcutaneous Q8H  . hydrALAZINE  25 mg Oral Daily  . insulin aspart  0-10 Units Subcutaneous TID WC  . insulin glargine  8 Units Subcutaneous QHS  . isosorbide mononitrate  60 mg Oral Daily  . lactulose  10 g Oral Daily  . levETIRAcetam  500 mg Oral q morning - 10a  . levofloxacin (LEVAQUIN) IV  500 mg Intravenous Q48H  . lisinopril  40 mg Oral Daily  .  metoprolol  50 mg Oral BID  .  morphine injection  2 mg Intravenous Once  . mupirocin ointment  1 application Nasal BID  . nicotine  14 mg Transdermal Daily  . nystatin  1 g Topical BID  . pantoprazole  40 mg Oral Daily  . polyethylene glycol  17 g Oral Daily  . ranolazine  500 mg Oral BID  .  risperiDONE  1 mg Oral TID  . senna  2 tablet Oral BID  . sodium chloride flush  3 mL Intravenous Q12H  . torsemide  100 mg Oral Daily  . white petrolatum   Topical Q72H   acetaminophen **OR** acetaminophen, bisacodyl, metolazone, metoprolol, nitroGLYCERIN, ondansetron **OR** ondansetron (ZOFRAN) IV  Assessment/ Plan:  50 y.o. female with end-stage renal disease on hemodialysis, asthma, gastroparesis, diabetes mellitus type II, congestive heart failure, hypertension, endometriosis, coronary atherosclerosis  [facility]/ [address]/ TTS RIJ permcath  1. End-stage renal disease on hemodialysis dialysis, TTHS: PermCath has been removed.  We have temporarily converted the patient to peritoneal dialysis as there is no hemodialysis access in place at the moment.  Patient continues to reside at a nursing home and will likely need hemodialysis access to be replaced sometime next week.  For now continue peritoneal dialysis.  2. Anemia of CKD: Hemoglobin slightly higher today at 7.5. Continue to monitor.  3. Sepsis: 2 blood cultures are positive.  Enterobacter and Escherichia coli noted.  Consult ID.  Permcath removed, PD catheter doesn't appear infected based on culture and cell count diff, will leave PD cath in place, continue levofloxacin, await further ID input.   4. Secondary Hyperparathyroidism:  Patient taken off of calcitriol as PTH was previously low.  Continue to monitor bone mineral metabolism parameters.  We will maintain the patient on calcium acetate 3 tablets by mouth 3 times a day with meals.   LOS: 2 Zoila Ditullio 9/29/201712:27 PM

## 2015-12-29 NOTE — Progress Notes (Signed)
SUBJECTIVE: Patient is still complaining of severe back pain but no chest pain   Vitals:   12/28/15 1141 12/28/15 2003 12/29/15 0324 12/29/15 0811  BP: (!) 157/57 (!) 187/69 125/75 (!) 143/58  Pulse: 66 79 (!) 117 88  Resp: 16 20 20 18   Temp: 98.4 F (36.9 C) 98.2 F (36.8 C) 99.8 F (37.7 C) 100.2 F (37.9 C)  TempSrc:  Oral Oral Oral  SpO2: 98% 98% 94% 95%  Weight:   262 lb 8 oz (119.1 kg)   Height:        Intake/Output Summary (Last 24 hours) at 12/29/15 0843 Last data filed at 12/29/15 0041  Gross per 24 hour  Intake              400 ml  Output               50 ml  Net              350 ml    LABS: Basic Metabolic Panel:  Recent Labs  60/73/71 1706 12/29/15 0425  NA  --  132*  K  --  5.0  CL  --  95*  CO2  --  30  GLUCOSE  --  198*  BUN  --  66*  CREATININE  --  5.90*  CALCIUM  --  10.2  PHOS 3.7  --    Liver Function Tests: No results for input(s): AST, ALT, ALKPHOS, BILITOT, PROT, ALBUMIN in the last 72 hours. No results for input(s): LIPASE, AMYLASE in the last 72 hours. CBC:  Recent Labs  12/27/15 1347 12/29/15 0425  WBC 6.3 6.0  HGB 7.1* 7.5*  HCT 21.4* 22.2*  MCV 94.3 91.8  PLT 200 264   Cardiac Enzymes: No results for input(s): CKTOTAL, CKMB, CKMBINDEX, TROPONINI in the last 72 hours. BNP: Invalid input(s): POCBNP D-Dimer: No results for input(s): DDIMER in the last 72 hours. Hemoglobin A1C: No results for input(s): HGBA1C in the last 72 hours. Fasting Lipid Panel: No results for input(s): CHOL, HDL, LDLCALC, TRIG, CHOLHDL, LDLDIRECT in the last 72 hours. Thyroid Function Tests: No results for input(s): TSH, T4TOTAL, T3FREE, THYROIDAB in the last 72 hours.  Invalid input(s): FREET3 Anemia Panel: No results for input(s): VITAMINB12, FOLATE, FERRITIN, TIBC, IRON, RETICCTPCT in the last 72 hours.   PHYSICAL EXAM General: Well developed, well nourished, in no acute distress HEENT:  Normocephalic and atramatic Neck:  No JVD.   Lungs: Clear bilaterally to auscultation and percussion. Heart: HRRR . Normal S1 and S2 without gallops or murmurs.  Abdomen: Bowel sounds are positive, abdomen soft and non-tender  Msk:  Back normal, normal gait. Normal strength and tone for age. Extremities: No clubbing, cyanosis or edema.   Neuro: Alert and oriented X 3. Psych:  Good affect, responds appropriately  TELEMETRY:Sinus rhythm  ASSESSMENT AND PLAN: Mildly elevated troponin with history of coronary artery disease and PCI presented with sepsis. We will hold off doing any cardiac workup because of sepsis.  Active Problems:   Chest pain   Bacteremia    Laurier Nancy, MD, [facility] 12/29/2015 8:43 AM

## 2015-12-29 NOTE — Care Management (Signed)
Patient is currently receiving peritoneal dialysis temporarily due to the removal of permcath.

## 2015-12-29 NOTE — Progress Notes (Signed)
Patient ID: Theron AristaLaura L Benedetti, female   DOB: 07/27/1965, 50 y.o.   MRN: [id number]   [employer] -  at [facility]   PATIENT NAME: Marcelene ButteLaura Furia    MR#:  [id number]  DATE OF BIRTH:  05/09/1965  SUBJECTIVE:  Pt had fever, chills ,blood cultures are positive for Escherichia coli from September 25.permacath removed now has only Peritoneal cath.says she feels poor today.  CHIEF COMPLAINT:   Chief Complaint  Patient presents with  . Chest Pain  50 year old female with past medical history of end-stage renal disease on hemodialysis, hypertension, hyperlipidemia, peripheral vascular disease, diabetes, history of coronary artery disease who Was admitted to the hospital due to chest pain. Today she states that her pain is completely resolved. She denies any chest pain, shortness of breath, nausea, vomiting, palpitations. Cardiology consultation and recommendations appreciated.   REVIEW OF SYSTEMS:  ROS CONSTITUTIONAL: No fever/chills, fatigue, weakness, weight gain/loss, headache. EYES: No blurry or double vision. ENT: No tinnitus, postnasal drip, redness or soreness of the oropharynx. RESPIRATORY: No cough, dyspnea, wheeze, hemoptysis.  CARDIOVASCULAR: No chest pain, palpitations, syncope, orthopnea,  GASTROINTESTINAL: No nausea, vomiting, constipation, diarrhea, abdominal pain, hematemesis, melena or hematochezia. GENITOURINARY: No dysuria, frequency, hematuria. ENDOCRINE: No polyuria or nocturia. No heat or cold intolerance. HEMATOLOGY: No anemia, bruising, bleeding. INTEGUMENTARY: No rashes, ulcers, lesions. MUSCULOSKELETAL: No arthritis, gout, edema. NEUROLOGIC: No numbness, tingling, ataxia, seizure-type activity, weakness. PSYCHIATRIC: No anxiety, depression, insomnia.   DRUG ALLERGIES:   Allergies  Allergen Reactions  . Cephalosporins Anaphylaxis  . Penicillins Anaphylaxis and Other (See Comments)    Has patient had a PCN reaction causing immediate rash,  facial/tongue/throat swelling, SOB or lightheadedness with hypotension: Yes Has patient had a PCN reaction causing severe rash involving mucus membranes or skin necrosis: No Has patient had a PCN reaction that required hospitalization No Has patient had a PCN reaction occurring within the last 10 years: No If all of the above answers are "NO", then may proceed with Cephalosporin use.  . Lamictal [Lamotrigine] Other (See Comments)    Reaction:  Hallucinations  . Phenergan [Promethazine Hcl] Nausea And Vomiting  . Pravastatin Other (See Comments)    Reaction:  Muscle pain   . Sulfa Antibiotics Other (See Comments)    Reaction:  Unknown    VITALS:  Blood pressure (!) 172/60, pulse 85, temperature 98.9 F (37.2 C), temperature source Oral, resp. rate 16, height 5\' 10"  (1.778 m), weight 119.1 kg (262 lb 8 oz), last menstrual period 03/16/2015, SpO2 99 %. PHYSICAL EXAMINATION:  Physical Exam  GENERAL:  50 y.o.-year-old white female patient lying in the bed in no acute distress. Appears chronically ill.  EYES: Pupils equal, round, reactive to light and accommodation. No scleral icterus. Extraocular muscles intact.  HEENT: Head atraumatic, normocephalic. Oropharynx and nasopharynx clear. No oropharyngeal erythema, moist oral mucosa  NECK:  Supple, no jugular venous distention. No thyroid enlargement, no tenderness.  LUNGS: Normal breath sounds bilaterally, no wheezing, rales, rhonchi. No use of accessory muscles of respiration.  CARDIOVASCULAR: S1, S2 RRR. No murmurs, rubs, gallops, clicks. Right chest wall dialysis catheter in place ABDOMEN: Soft, nontender, nondistended. Bowel sounds present. No organomegaly or mass. Positive peritoneal dialysis catheter in place without acute drainage. EXTREMITIES: No pedal edema, cyanosis, or clubbing. + 2 pedal & radial pulses b/l.   NEUROLOGIC: Cranial nerves II through XII are intact. No focal Motor or sensory deficits appreciated b/l.  Globally weak.     PSYCHIATRIC: The patient is alert and oriented  x 2.  SKIN: No obvious rash, lesion, or ulcer.    LABORATORY PANEL:   CBC  Recent Labs Lab 12/29/15 0425  WBC 6.0  HGB 7.5*  HCT 22.2*  PLT 264   ------------------------------------------------------------------------------------------------------------------ Chemistries   Recent Labs Lab 12/25/15 1434  12/29/15 0425  NA 131*  < > 132*  K 5.0  < > 5.0  CL 96*  < > 95*  CO2 28  < > 30  GLUCOSE 132*  < > 198*  BUN 64*  < > 66*  CREATININE 5.65*  < > 5.90*  CALCIUM 9.3  < > 10.2  AST 17  --   --   ALT 9*  --   --   ALKPHOS 66  --   --   BILITOT 0.5  --   --   < > = values in this interval not displayed. RADIOLOGY:  No results found. ASSESSMENT AND PLAN:   1. Chest pain with elevated troponin which is trending down. 1.01-0.63-0.54. Likely secondary to bacteremia: No chest pain today. No further workup . 2. Sepsis - blood cultures positive for enterococcus  Continue Levaquin and follow-up sensitivities. Urinalysis and chest x-ray have been negative. Patient has permacath  removed, Peritoneal fluid cultures are negative to date.rpt blood cultures from 9/28 are negative to date. Slightly elevated troponin secondary to demand ischemia rather than acute MI. Seen by cardiology. 2. End-stage renal disease on hemodialysis-patient gets scheduled dialysis on Tuesday Thursday Saturday.now we removed permacath due to bacteremic.had PD in place,needs HD cath next week for her to go back to NH.  3. Diabetes type 2 without complication-continue Lantus, sliding scale insulin.  4. Essential hypertension-continue lisinopril, metoprolol, Imdur.  5. History of seizures-continue Keppra.  6. GERD-continue Protonix.  7. Secondary hyperparathyroidism-continue calcium acetate.  #8. Chronic deconditioning: Patient says that she is bedbound    #9 constipation: Continue stool softeners.. Discussed with patient, appreciate presence of   RN  during rounds.   All the records are reviewed and case discussed with Care Management/Social Worker. Management plans discussed with the patient, family and they are in agreement.  CODE STATUS: FULL  TOTAL TIME TAKING CARE OF THIS PATIENT: 25 minutes.   More than 50% of the time was spent in counseling/coordination of care: YES  POSSIBLE D/C IN 1-2 DAYS, DEPENDING ON CLINICAL CONDITION.   Katha Hamming M.D on 12/29/2015 at 2:54 PM  Between 7am to 6pm - Pager - [phone]  After 6pm go to [email or web address] - Social research officer, government  [facility]  Office  [phone]  CC: Primary care physician; Jarome Matin, MD  Note: This dictation was prepared with Dragon dictation along with smaller phrase technology. Any transcriptional errors that result from this process are unintentional.

## 2015-12-29 NOTE — Progress Notes (Signed)
Inpatient Diabetes Program Recommendations  AACE/ADA: New Consensus Statement on Inpatient Glycemic Control (2015)  Target Ranges:  Prepandial:   less than 140 mg/dL      Peak postprandial:   less than 180 mg/dL (1-2 hours)      Critically ill patients:  140 - 180 mg/dL  Results for PRITHIKA, PROCTOR (MRN [id number]) as of 12/29/2015 08:21  Ref. Range 12/28/2015 07:15 12/28/2015 11:00 12/28/2015 16:12 12/28/2015 17:03 12/28/2015 21:06 12/29/2015 03:25 12/29/2015 07:40  Glucose-Capillary Latest Ref Range: 65 - 99 mg/dL 517 (H) 001 (H) 749 (H) 134 (H) 232 (H) 225 (H) 220 (H)    Review of Glycemic Control  Diabetes history: DM2 Outpatient Diabetes medications: Lantus 8 units QHS, Novolog 0-10 units TId with meals Current orders for Inpatient glycemic control: Lantus 8 units QHS, Novolog 0-10 units TID with meals  Inpatient Diabetes Program Recommendations: Correction (SSI): Please consider ordering Novolog bedtime correction scale.  Thanks, Orlando Penner, RN, MSN, CDE Diabetes Coordinator Inpatient Diabetes Program [phone] (Team Pager from 8am to 5pm) [phone] ([facility] office) [phone] [facility] office) [phone] [facility] office)

## 2015-12-29 NOTE — Progress Notes (Signed)
Accepted bed at [facility]. Neysa Bonito- Admissions is aware that patient may discharge over weekend.  Woodroe Mode, MSW, LCSW, LCAS-A Clinical Social Worker [phone]

## 2015-12-29 NOTE — Plan of Care (Signed)
Problem: Skin Integrity: Goal: Risk for impaired skin integrity will decrease Outcome: Not Progressing Patient declining turns and repositioning at this time. Several attempts to reposition for comfort and skin integrity made, pt continues to refuse.  Problem: Nutrition: Goal: Adequate nutrition will be maintained Outcome: Not Progressing Poor PO intake

## 2015-12-29 NOTE — Care Management Important Message (Signed)
Important Message  Patient Details  Name: Kimberly Montgomery MRN: [id number] Date of Birth: 12/12/1965   Medicare Important Message Given:  Yes    Eber Hong, RN 12/29/2015, 11:21 AM

## 2015-12-29 NOTE — Progress Notes (Signed)
Patient reporting lower back pain unrelieved with PRN tylenol. Dr. Luberta Mutter paged and orders received.

## 2015-12-30 ENCOUNTER — Inpatient Hospital Stay: Payer: Medicare HMO

## 2015-12-30 LAB — GLUCOSE, CAPILLARY
GLUCOSE-CAPILLARY: 149 mg/dL — AB (ref 65–99)
Glucose-Capillary: 153 mg/dL — ABNORMAL HIGH (ref 65–99)
Glucose-Capillary: 153 mg/dL — ABNORMAL HIGH (ref 65–99)
Glucose-Capillary: 201 mg/dL — ABNORMAL HIGH (ref 65–99)

## 2015-12-30 LAB — CULTURE, BLOOD (ROUTINE X 2)

## 2015-12-30 LAB — CULTURE, BODY FLUID W GRAM STAIN -BOTTLE: Culture: NO GROWTH

## 2015-12-30 LAB — CULTURE, BODY FLUID-BOTTLE

## 2015-12-30 MED ORDER — EPOETIN ALFA 20000 UNIT/ML IJ SOLN
20000.0000 [IU] | INTRAMUSCULAR | Status: DC
  Administered 2015-12-30 – 2016-01-02 (×2): 20000 [IU] via SUBCUTANEOUS
  Filled 2015-12-30: qty 1

## 2015-12-30 MED ORDER — SODIUM CHLORIDE 0.9 % IV SOLN
500.0000 mg | Freq: Two times a day (BID) | INTRAVENOUS | Status: DC
  Administered 2015-12-30 – 2015-12-31 (×2): 500 mg via INTRAVENOUS
  Filled 2015-12-30 (×3): qty 0.5

## 2015-12-30 MED ORDER — METAXALONE 800 MG PO TABS
800.0000 mg | ORAL_TABLET | Freq: Two times a day (BID) | ORAL | Status: DC
  Administered 2015-12-30 – 2016-01-04 (×11): 800 mg via ORAL
  Filled 2015-12-30 (×12): qty 1

## 2015-12-30 MED ORDER — MORPHINE SULFATE (PF) 2 MG/ML IV SOLN
2.0000 mg | INTRAVENOUS | Status: DC | PRN
  Administered 2015-12-30 – 2016-01-04 (×11): 2 mg via INTRAVENOUS
  Filled 2015-12-30 (×12): qty 1

## 2015-12-30 NOTE — Progress Notes (Signed)
[facility]  ROUNDING NOTE   Subjective:  Patient tolerating peritoneal dialysis well. Ultrafiltration reported was 890 cc over the preceding 24 hours. Overall she remains quite weak and bed bound.   Objective:  Vital signs in last 24 hours:  Temp:  [97.8 F (36.6 C)-98.6 F (37 C)] 97.8 F (36.6 C) (09/30 1221) Pulse Rate:  [73-87] 73 (09/30 1221) Resp:  [17-18] 17 (09/30 1221) BP: (111-140)/(44-95) 111/44 (09/30 1221) SpO2:  [89 %-97 %] 97 % (09/30 1221) Weight:  [121.9 kg (268 lb 11.2 oz)-122.4 kg (269 lb 13.5 oz)] 121.9 kg (268 lb 11.2 oz) (09/30 0535)  Weight change: 3.331 kg (7 lb 5.5 oz) Filed Weights   12/29/15 0324 12/30/15 0500 12/30/15 0535  Weight: 119.1 kg (262 lb 8 oz) 122.4 kg (269 lb 13.5 oz) 121.9 kg (268 lb 11.2 oz)    Intake/Output: I/O last 3 completed shifts: In: 340 [P.O.:240; IV Piggyback:100] Out: 869 [Other:869]   Intake/Output this shift:  Total I/O In: -  Out: 890 [Other:890]  Physical Exam: General: No acute distress  Head: Normocephalic, atraumatic. Moist oral mucosal membranes  Eyes: Anicteric  Neck: Supple, trachea midline  Lungs:  Clear to auscultation, normal effort  Heart: S1S2 no rubs  Abdomen:  Soft, nontender, BS present, PD catheter in place  Extremities: RLE amputation noted, LLE edema noted  Neurologic: Nonfocal, moving all four extremities  Skin: No lesions  Access: PD cath in place    Basic Metabolic Panel:  Recent Labs Lab 12/24/15 0816 12/25/15 1434 12/26/15 0431 12/28/15 1706 12/29/15 0425  NA 132* 131* 131*  --  132*  K 4.2 5.0 5.2*  --  5.0  CL 97* 96* 98*  --  95*  CO2 30 28 26   --  30  GLUCOSE 115* 132* 186*  --  198*  BUN 45* 64* 68*  --  66*  CREATININE 4.51* 5.65* 6.05*  --  5.90*  CALCIUM 9.2 9.3 9.1  --  10.2  PHOS  --   --   --  3.7  --     Liver Function Tests:  Recent Labs Lab 12/25/15 1434  AST 17  ALT 9*  ALKPHOS 66  BILITOT 0.5  PROT 5.0*  ALBUMIN 2.2*   No  results for input(s): LIPASE, AMYLASE in the last 168 hours. No results for input(s): AMMONIA in the last 168 hours.  CBC:  Recent Labs Lab 12/24/15 0816 12/25/15 1434 12/26/15 0431 12/27/15 1347 12/29/15 0425  WBC 6.6 11.0 11.0 6.3 6.0  NEUTROABS  --  9.6*  --   --   --   HGB 9.6* 7.7* 7.4* 7.1* 7.5*  HCT 28.1* 23.5* 22.5* 21.4* 22.2*  MCV 94.3 95.2 96.4 94.3 91.8  PLT 299 226 207 200 264    Cardiac Enzymes:  Recent Labs Lab 12/24/15 1206 12/25/15 1434 12/25/15 2036 12/26/15 0120 12/26/15 0431  TROPONINI 0.03* 0.66* 1.01* 0.63* 0.54*    BNP: Invalid input(s): POCBNP  CBG:  Recent Labs Lab 12/29/15 1159 12/29/15 1626 12/29/15 2113 12/30/15 0808 12/30/15 1238  GLUCAP 191* 141* 165* 149* 153*    Microbiology: Results for orders placed or performed during the hospital encounter of 12/25/15  Blood culture (routine x 2)     Status: Abnormal   Collection Time: 12/25/15  2:32 PM  Result Value Ref Range Status   Specimen Description BLOOD  L HAND  Final   Special Requests   Final    BOTTLES DRAWN AEROBIC AND ANAEROBIC  AER 12 ML ANA 14 ML   Culture  Setup Time   Final    GRAM NEGATIVE RODS IN BOTH AEROBIC AND ANAEROBIC BOTTLES CRITICAL VALUE NOTED.  VALUE IS CONSISTENT WITH PREVIOUSLY REPORTED AND CALLED VALUE. SDR    Culture (A)  Final    ENTEROBACTER CLOACAE SUSCEPTIBILITIES PERFORMED ON PREVIOUS CULTURE WITHIN THE LAST 5 DAYS. Performed at [facility]    Report Status 12/30/2015 FINAL  Final  Blood culture (routine x 2)     Status: Abnormal   Collection Time: 12/25/15  2:32 PM  Result Value Ref Range Status   Specimen Description BLOOD  L FA  Final   Special Requests   Final    BOTTLES DRAWN AEROBIC AND ANAEROBIC  AER 12 ML ANA 12 ML   Culture  Setup Time   Final    GRAM NEGATIVE RODS IN BOTH AEROBIC AND ANAEROBIC BOTTLES CRITICAL RESULT CALLED TO, READ BACK BY AND VERIFIED WITH: NATE COOKSON AT 0535 12/26/15 SDR Performed at [facility]    Culture ENTEROBACTER CLOACAE (A)  Final   Report Status 12/30/2015 FINAL  Final   Organism ID, Bacteria ENTEROBACTER CLOACAE  Final      Susceptibility   Enterobacter cloacae - MIC*    CEFAZOLIN >=64 RESISTANT Resistant     CEFEPIME <=1 SENSITIVE Sensitive     CEFTAZIDIME 16 INTERMEDIATE Intermediate     CEFTRIAXONE >=64 RESISTANT Resistant     CIPROFLOXACIN >=4 RESISTANT Resistant     GENTAMICIN <=1 SENSITIVE Sensitive     IMIPENEM <=0.25 SENSITIVE Sensitive     TRIMETH/SULFA <=20 SENSITIVE Sensitive     PIP/TAZO >=128 RESISTANT Resistant     * ENTEROBACTER CLOACAE  Blood Culture ID Panel (Reflexed)     Status: Abnormal   Collection Time: 12/25/15  2:32 PM  Result Value Ref Range Status   Enterococcus species NOT DETECTED NOT DETECTED Final   Listeria monocytogenes NOT DETECTED NOT DETECTED Final   Staphylococcus species NOT DETECTED NOT DETECTED Final   Staphylococcus aureus NOT DETECTED NOT DETECTED Final   Streptococcus species NOT DETECTED NOT DETECTED Final   Streptococcus agalactiae NOT DETECTED NOT DETECTED Final   Streptococcus pneumoniae NOT DETECTED NOT DETECTED Final   Streptococcus pyogenes NOT DETECTED NOT DETECTED Final   Acinetobacter baumannii NOT DETECTED NOT DETECTED Final   Enterobacteriaceae species DETECTED (A) NOT DETECTED Final    Comment: CRITICAL RESULT CALLED TO, READ BACK BY AND VERIFIED WITH:  NATE COOKSON AT 0535 12/26/15 SDR    Enterobacter cloacae complex DETECTED (A) NOT DETECTED Corrected    Comment: CRITICAL RESULT CALLED TO, READ BACK BY AND VERIFIED WITH: K MERRILL 12/28/15 @ 1210 M VESTAL CORRECTED ON 09/28 AT 1211: PREVIOUSLY REPORTED AS NOT DETECTED    Escherichia coli NOT DETECTED NOT DETECTED Corrected    Comment: PREVIOUSLY REPORTED AS: DETECTED CORRECTED RESULTS CALLED TO: K MERRILL 12/28/15 @ 1210 M VESTAL Performed at [facility] CORRECTED ON 09/28 AT 1211: PREVIOUSLY REPORTED AS DETECTED CRITICAL RESULT CALLED  TO, READ BACK BY AND VERIFIED WITH:  NATE COOKSON AT 0535 12/26/15 SDR    Klebsiella oxytoca NOT DETECTED NOT DETECTED Final   Klebsiella pneumoniae NOT DETECTED NOT DETECTED Final   Proteus species NOT DETECTED NOT DETECTED Final   Serratia marcescens NOT DETECTED NOT DETECTED Final   Carbapenem resistance NOT DETECTED NOT DETECTED Final   Haemophilus influenzae NOT DETECTED NOT DETECTED Final   Neisseria meningitidis NOT DETECTED NOT DETECTED Final  Pseudomonas aeruginosa NOT DETECTED NOT DETECTED Final   Candida albicans NOT DETECTED NOT DETECTED Final   Candida glabrata NOT DETECTED NOT DETECTED Final   Candida krusei NOT DETECTED NOT DETECTED Final   Candida parapsilosis NOT DETECTED NOT DETECTED Final   Candida tropicalis NOT DETECTED NOT DETECTED Final  Culture, body fluid-bottle     Status: None   Collection Time: 12/25/15  4:00 PM  Result Value Ref Range Status   Specimen Description FLUID PERITONEAL DIALYSATE  Final   Special Requests BOTTLES DRAWN AEROBIC AND ANAEROBIC 10CC  Final   Culture   Final    NO GROWTH 5 DAYS Performed at [facility]    Report Status 12/30/2015 FINAL  Final  Gram stain     Status: None   Collection Time: 12/25/15  4:00 PM  Result Value Ref Range Status   Specimen Description FLUID PERITONEAL DIALYSATE  Final   Special Requests NONE  Final   Gram Stain   Final    FEW WBC PRESENT,BOTH PMN AND MONONUCLEAR NO ORGANISMS SEEN Performed at [facility]    Report Status 12/27/2015 FINAL  Final  MRSA PCR Screening     Status: Abnormal   Collection Time: 12/26/15  1:34 PM  Result Value Ref Range Status   MRSA by PCR POSITIVE (A) NEGATIVE Final    Comment:        The GeneXpert MRSA Assay (FDA approved for NASAL specimens only), is one component of a comprehensive MRSA colonization surveillance program. It is not intended to diagnose MRSA infection nor to guide or monitor treatment for MRSA infections. RESULT CALLED TO, READ  BACK BY AND VERIFIED WITH: AMY TODD RN AT 1455 12/26/15 MSS.   Culture, body fluid-bottle     Status: None (Preliminary result)   Collection Time: 12/27/15  4:15 PM  Result Value Ref Range Status   Specimen Description PERITONEAL DIALYSATE  Final   Special Requests NONE  Final   Culture   Final    NO GROWTH 3 DAYS Performed at [facility]    Report Status PENDING  Incomplete  Gram stain     Status: None   Collection Time: 12/27/15  4:15 PM  Result Value Ref Range Status   Specimen Description PERITONEAL DIALYSATE  Final   Special Requests NONE  Final   Gram Stain   Final    WBC PRESENT,BOTH PMN AND MONONUCLEAR NO ORGANISMS SEEN CYTOSPIN Performed at [facility]    Report Status 12/27/2015 FINAL  Final  CULTURE, BLOOD (ROUTINE X 2) w Reflex to ID Panel     Status: None (Preliminary result)   Collection Time: 12/28/15  7:19 PM  Result Value Ref Range Status   Specimen Description BLOOD RIGHT AC  Final   Special Requests BOTTLES DRAWN AEROBIC AND ANAEROBIC 10 ML  Final   Culture NO GROWTH 2 DAYS  Final   Report Status PENDING  Incomplete    Coagulation Studies: No results for input(s): LABPROT, INR in the last 72 hours.  Urinalysis: No results for input(s): COLORURINE, LABSPEC, PHURINE, GLUCOSEU, HGBUR, BILIRUBINUR, KETONESUR, PROTEINUR, UROBILINOGEN, NITRITE, LEUKOCYTESUR in the last 72 hours.  Invalid input(s): APPERANCEUR    Imaging: Ct Head Wo Contrast  Result Date: 12/30/2015 CLINICAL DATA:  Altered mental status EXAM: CT HEAD WITHOUT CONTRAST TECHNIQUE: Contiguous axial images were obtained from the base of the skull through the vertex without intravenous contrast. COMPARISON:  07/21/2015 FINDINGS: Brain: Mild global atrophy. Chronic ischemic changes in the  periventricular white matter. No mass effect, midline shift, or acute hemorrhage. Vascular: Carotid siphon calcifications Skull: Intact Sinuses/Orbits: Mucosal thickening in the left maxillary  sinus. Other: Noncontributory IMPRESSION: No acute intracranial pathology.  Chronic changes. Electronically Signed   By: Jolaine ClickArthur  Hoss M.D.   On: 12/30/2015 14:31     Medications:     . calcium acetate  2,001 mg Oral TID WC  . calcium carbonate  1 tablet Oral TID WC  . clopidogrel  75 mg Oral Daily  . colesevelam  625 mg Oral BID WC  . dialysis solution 2.5% low-MG/low-CA   Intraperitoneal Q24H  . docusate sodium  100 mg Oral QHS  . feeding supplement (PRO-STAT SUGAR FREE 64)  30 mL Oral TID WC  . gentamicin cream  1 application Topical Daily  . heparin subcutaneous  5,000 Units Subcutaneous Q8H  . hydrALAZINE  25 mg Oral Daily  . insulin aspart  0-10 Units Subcutaneous TID WC  . insulin glargine  8 Units Subcutaneous QHS  . isosorbide mononitrate  60 mg Oral Daily  . lactulose  10 g Oral Daily  . levETIRAcetam  500 mg Oral q morning - 10a  . levofloxacin (LEVAQUIN) IV  500 mg Intravenous Q48H  . lisinopril  40 mg Oral Daily  . meropenem (MERREM) IV  500 mg Intravenous Q12H  . metaxalone  800 mg Oral BID  . metoprolol  50 mg Oral BID  . mupirocin ointment  1 application Nasal BID  . nicotine  14 mg Transdermal Daily  . nystatin  1 g Topical BID  . pantoprazole  40 mg Oral Daily  . polyethylene glycol  17 g Oral Daily  . ranolazine  500 mg Oral BID  . senna  2 tablet Oral BID  . sodium chloride flush  3 mL Intravenous Q12H  . torsemide  100 mg Oral Daily  . white petrolatum   Topical Q72H   acetaminophen **OR** acetaminophen, bisacodyl, metolazone, metoprolol, morphine injection, nitroGLYCERIN, ondansetron **OR** ondansetron (ZOFRAN) IV, oxyCODONE-acetaminophen  Assessment/ Plan:  50 y.o. female with end-stage renal disease on hemodialysis, asthma, gastroparesis, diabetes mellitus type II, congestive heart failure, hypertension, endometriosis, coronary atherosclerosis  [facility]/ [address]/ TTS RIJ permcath  1. End-stage renal disease on hemodialysis  dialysis, TTHS: PermCath has been removed.  We have temporarily converted the patient to peritoneal dialysis as there is no hemodialysis access in place at the moment.   - Patient tolerating peritoneal dialysis well so far. Ultrafiltration achieved was 890 cc over the preceding 24 hours. Continue peritoneal dialysis using the current prescription.  2. Anemia of CKD: We will initiate Epogen 20,000 units subcutaneous weekly.  3. Sepsis: 2 blood cultures are positive.  Enterobacter and Escherichia coli noted.   -  Appreciate infectious disease consultation. Maintain the patient on levofloxacin for a total of 14 days. PermCath can be replaced once repeat cultures remain negative for 48 hours.  4. Secondary Hyperparathyroidism:  Patient taken off of calcitriol as PTH was previously low.  - Continue calcium acetate 3 tablets by mouth 3 times a day with meals.   LOS: 3 Kourtni Stineman 9/30/20173:24 PM

## 2015-12-30 NOTE — Progress Notes (Signed)
Post PD assessment. Pt tolerated well without issue

## 2015-12-30 NOTE — Progress Notes (Signed)
Patient ID: DYSTINY SEHORN, female   DOB: 1965/07/26, 50 y.o.   MRN: [id number]   [employer] [facility] [facility] [place] [facility]   PATIENT NAME: Kimberly Montgomery    MR#:  [id number]  DATE OF BIRTH:  1965/06/01  SUBJECTIVE: Patient complains of neck pain. No fever.   CHIEF COMPLAINT:   Chief Complaint  Patient presents with  . Chest Pain  50 year old female with past medical history of end-stage renal disease on hemodialysis, hypertension, hyperlipidemia, peripheral vascular disease, diabetes, history of coronary artery disease who Was admitted to the hospital due to chest pain. Found to have bacteremia with Enterobacter, permacath removed. Now complains of neck pain today. On Levaquin for antibiotic for bacteremia.  REVIEW OF SYSTEMS:  ROS CONSTITUTIONAL: No fever/chills, fatigue, weakness, weight gain/loss, headache. EYES: No blurry or double vision. ENT: No tinnitus, postnasal drip, redness or soreness of the oropharynx.has neck pain, RESPIRATORY: No cough, dyspnea, wheeze, hemoptysis.  CARDIOVASCULAR: No chest pain, palpitations, syncope, orthopnea,  GASTROINTESTINAL: No nausea, vomiting, constipation, diarrhea, abdominal pain, hematemesis, melena or hematochezia. GENITOURINARY: No dysuria, frequency, hematuria. ENDOCRINE: No polyuria or nocturia. No heat or cold intolerance. HEMATOLOGY: No anemia, bruising, bleeding. INTEGUMENTARY: No rashes, ulcers, lesions. MUSCULOSKELETAL: No arthritis, gout, edema. NEUROLOGIC: No numbness, tingling, ataxia, seizure-type activity, weakness. PSYCHIATRIC: No anxiety, depression, insomnia.   DRUG ALLERGIES:   Allergies  Allergen Reactions  . Cephalosporins Anaphylaxis  . Penicillins Anaphylaxis and Other (See Comments)    Has patient had a PCN reaction causing immediate rash, facial/tongue/throat swelling, SOB or lightheadedness with hypotension: Yes Has patient had a PCN reaction causing severe rash involving mucus membranes or skin  necrosis: No Has patient had a PCN reaction that required hospitalization No Has patient had a PCN reaction occurring within the last 10 years: No If all of the above answers are "NO", then may proceed with Cephalosporin use.  . Lamictal [Lamotrigine] Other (See Comments)    Reaction:  Hallucinations  . Phenergan [Promethazine Hcl] Nausea And Vomiting  . Pravastatin Other (See Comments)    Reaction:  Muscle pain   . Sulfa Antibiotics Other (See Comments)    Reaction:  Unknown    VITALS:  Blood pressure (!) 140/95, pulse 87, temperature 98.6 F (37 C), temperature source Oral, resp. rate 18, height 5\' 10"  (1.778 m), weight 121.9 kg (268 lb 11.2 oz), last menstrual period 03/16/2015, SpO2 (!) 89 %. PHYSICAL EXAMINATION:  Physical Exam  GENERAL:  50 y.o.-year-old white female patient lying in the bed in no acute distress. Appears chronically ill.  EYES: Pupils equal, round, reactive to light and accommodation. No scleral icterus. Extraocular muscles intact.  HEENT: Head atraumatic, normocephalic. Oropharynx and nasopharynx clear. No oropharyngeal erythema, moist oral mucosa  NECK:  tight neck muscle on right side.  no jugular venous distention. No thyroid enlargement, no tenderness.  LUNGS: Normal breath sounds bilaterally, no wheezing, rales, rhonchi. No use of accessory muscles of respiration.  CARDIOVASCULAR: S1, S2 RRR. No murmurs, rubs, gallops, clicks. Right chest wall dialysis catheter in place ABDOMEN: Soft, nontender, nondistended. Bowel sounds present. No organomegaly or mass. Positive peritoneal dialysis catheter in place without acute drainage. EXTREMITIES: No pedal edema, cyanosis, or clubbing. + 2 pedal & radial pulses b/l.   NEUROLOGIC: Cranial nerves II through XII are intact. No focal Motor or sensory deficits appreciated b/l.  Globally weak.   PSYCHIATRIC: The patient is alert and oriented x 2.  SKIN: No obvious rash, lesion, or ulcer.    LABORATORY PANEL:  CBC  Recent Labs Lab 12/29/15 0425  WBC 6.0  HGB 7.5*  HCT 22.2*  PLT 264   ------------------------------------------------------------------------------------------------------------------ Chemistries   Recent Labs Lab 12/25/15 1434  12/29/15 0425  NA 131*  < > 132*  K 5.0  < > 5.0  CL 96*  < > 95*  CO2 28  < > 30  GLUCOSE 132*  < > 198*  BUN 64*  < > 66*  CREATININE 5.65*  < > 5.90*  CALCIUM 9.3  < > 10.2  AST 17  --   --   ALT 9*  --   --   ALKPHOS 66  --   --   BILITOT 0.5  --   --   < > = values in this interval not displayed. RADIOLOGY:  No results found. ASSESSMENT AND PLAN:   1. Chest pain with elevated troponin which is trending down. 1.01-0.63-0.54. Likely secondary to bacteremia: No chest pain today. No further workup . 2. Sepsis - blood cultures positive for enterococcus  Continue Levaquin and follow-up sensitivities. Urinalysis and chest x-ray have been negative. Patient has permacath  removed, Peritoneal fluid cultures are negative to date.rpt blood cultures from 9/28 are negative to date.He seen by ID, continue Levaquin for 14 days.  Slightly elevated troponin secondary to demand ischemia rather than acute MI. Seen by cardiology. 2. End-stage renal disease on hemodialysis-patient gets scheduled dialysis on Tuesday Thursday Saturday.now we removed permacath due to bacteremic.had PD in place,needs HD cath next week for her to go back to NH.  3. Diabetes type 2 without complication-continue Lantus, sliding scale insulin.  4. Essential hypertension-continue lisinopril, metoprolol, Imdur.  5. History of seizures-continue Keppra.  6. GERD-continue Protonix.  7. Secondary hyperparathyroidism-continue calcium acetate.  #8. Chronic deconditioning: Patient says that she is bedbound    #9 constipation: Continue stool softeners.. Discussed with patient, appreciate presence of  RN  during rounds. #10 neck pain secondary to possible muscle pull: Add  Skelaxin. Continue by mouth Percocet, and IV morphine as needed. D/w RN ,nephrologist  All the records are reviewed and case discussed with Care Management/Social Worker. Management plans discussed with the patient, family and they are in agreement.  CODE STATUS: FULL  TOTAL TIME TAKING CARE OF THIS PATIENT: 25 minutes.   More than 50% of the time was spent in counseling/coordination of care: YES  POSSIBLE D/C IN 1-2 DAYS, DEPENDING ON CLINICAL CONDITION.   Katha HammingKONIDENA,Kimberly Montgomery M.D on 12/30/2015 at 12:20 PM  Between 7am to 6pm - Pager - [phone]  After 6pm go to [email or web address] - Social research officer, governmentpassword EPAS [facility]  [facility] [place] [facility]  Office  [phone]  CC: Primary care physician; Jarome MatinALE, PATRICK D, MD  Note: This dictation was prepared with Dragon dictation along with smaller phrase technology. Any transcriptional errors that result from this process are unintentional.

## 2015-12-30 NOTE — Progress Notes (Signed)
SUBJECTIVE: No chest pain   Vitals:   12/29/15 1722 12/29/15 2004 12/30/15 0500 12/30/15 0535  BP: (!) 131/56 (!) 137/51  (!) 140/95  Pulse: 78 77  87  Resp:  18  18  Temp:  98.3 F (36.8 C)  98.6 F (37 C)  TempSrc:  Oral  Oral  SpO2:  97%  (!) 89%  Weight:   269 lb 13.5 oz (122.4 kg) 268 lb 11.2 oz (121.9 kg)  Height:        Intake/Output Summary (Last 24 hours) at 12/30/15 1039 Last data filed at 12/30/15 0913  Gross per 24 hour  Intake              340 ml  Output              890 ml  Net             -550 ml    LABS: Basic Metabolic Panel:  Recent Labs  [birth year] 1706 12/29/15 0425  NA  --  132*  K  --  5.0  CL  --  95*  CO2  --  30  GLUCOSE  --  198*  BUN  --  66*  CREATININE  --  5.90*  CALCIUM  --  10.2  PHOS 3.7  --    Liver Function Tests: No results for input(s): AST, ALT, ALKPHOS, BILITOT, PROT, ALBUMIN in the last 72 hours. No results for input(s): LIPASE, AMYLASE in the last 72 hours. CBC:  Recent Labs  12/27/15 1347 12/29/15 0425  WBC 6.3 6.0  HGB 7.1* 7.5*  HCT 21.4* 22.2*  MCV 94.3 91.8  PLT 200 264   Cardiac Enzymes: No results for input(s): CKTOTAL, CKMB, CKMBINDEX, TROPONINI in the last 72 hours. BNP: Invalid input(s): POCBNP D-Dimer: No results for input(s): DDIMER in the last 72 hours. Hemoglobin A1C: No results for input(s): HGBA1C in the last 72 hours. Fasting Lipid Panel: No results for input(s): CHOL, HDL, LDLCALC, TRIG, CHOLHDL, LDLDIRECT in the last 72 hours. Thyroid Function Tests: No results for input(s): TSH, T4TOTAL, T3FREE, THYROIDAB in the last 72 hours.  Invalid input(s): FREET3 Anemia Panel: No results for input(s): VITAMINB12, FOLATE, FERRITIN, TIBC, IRON, RETICCTPCT in the last 72 hours.   PHYSICAL EXAM General: Well developed, well nourished, in no acute distress HEENT:  Normocephalic and atramatic Neck:  No JVD.  Lungs: Clear bilaterally to auscultation and percussion. Heart: HRRR . Normal S1 and S2  without gallops or murmurs.  Abdomen: Bowel sounds are positive, abdomen soft and non-tender  Msk:  Back normal, normal gait. Normal strength and tone for age. Extremities: No clubbing, cyanosis or edema.   Neuro: Alert and oriented X 3. Psych:  Good affect, responds appropriately  TELEMETRY:Sinus rhythm  ASSESSMENT AND PLAN: Atypical chest pain with mildly elevated troponin on admission associated with sepsis. Cardiac workup may be needed but at once patient recovers as an outpatient.  Active Problems:   Chest pain   Bacteremia    Kimberly Montgomery A, MD, [facility] 12/30/2015 10:39 AM

## 2015-12-31 LAB — GLUCOSE, CAPILLARY
GLUCOSE-CAPILLARY: 165 mg/dL — AB (ref 65–99)
Glucose-Capillary: 145 mg/dL — ABNORMAL HIGH (ref 65–99)
Glucose-Capillary: 134 mg/dL — ABNORMAL HIGH (ref 65–99)
Glucose-Capillary: 140 mg/dL — ABNORMAL HIGH (ref 65–99)

## 2015-12-31 MED ORDER — SODIUM CHLORIDE 0.9 % IV SOLN
1.0000 g | INTRAVENOUS | Status: DC
  Administered 2016-01-01 – 2016-01-02 (×2): 1 g via INTRAVENOUS
  Filled 2015-12-31 (×3): qty 1

## 2015-12-31 NOTE — Care Management Important Message (Signed)
Important Message  Patient Details  Name: Kimberly Montgomery MRN: [id number] Date of Birth: Jan 19, 1966   Medicare Important Message Given:  Yes    Gwenlyn Hottinger A, RN 12/31/2015, 4:07 PM

## 2015-12-31 NOTE — Progress Notes (Signed)
Pharmacy Antibiotic Note  Kimberly Montgomery is a 50 y.o. female admitted on 12/25/2015 with bacteremia.  Pharmacy has been consulted for meropenem dosing.  Plan: Patient initiated on meropenem on 9/30. Will plan meropenem 1000mg  IV Q24hr and transition to gentamicin with dialysis when dialysis catheter is placed. Patient is currently receiving PD.     Height: 5\' 10"  (177.8 cm) Weight: 256 lb 6.4 oz (116.3 kg) IBW/kg (Calculated) : 68.5  Temp (24hrs), Avg:98.3 F (36.8 C), Min:97.8 F (36.6 C), Max:98.6 F (37 C)   Recent Labs Lab 12/25/15 1432 12/25/15 1434 12/26/15 0431 12/27/15 1347 12/29/15 0425  WBC  --  11.0 11.0 6.3 6.0  CREATININE  --  5.65* 6.05*  --  5.90*  LATICACIDVEN 1.1  --   --   --   --     Estimated Creatinine Clearance: 15.4 mL/min (by C-G formula based on SCr of 5.9 mg/dL (H)).    Allergies  Allergen Reactions  . Cephalosporins Anaphylaxis  . Penicillins Anaphylaxis and Other (See Comments)    Has patient had a PCN reaction causing immediate rash, facial/tongue/throat swelling, SOB or lightheadedness with hypotension: Yes Has patient had a PCN reaction causing severe rash involving mucus membranes or skin necrosis: No Has patient had a PCN reaction that required hospitalization No Has patient had a PCN reaction occurring within the last 10 years: No If all of the above answers are "NO", then may proceed with Cephalosporin use.  . Lamictal [Lamotrigine] Other (See Comments)    Reaction:  Hallucinations  . Phenergan [Promethazine Hcl] Nausea And Vomiting  . Pravastatin Other (See Comments)    Reaction:  Muscle pain   . Sulfa Antibiotics Other (See Comments)    Reaction:  Unknown     Antimicrobials this admission: Aztreonam 9/25 >> 9/25 Vancomycin  9/25 >> 9/25   Levofloxacin 9/26 >> 9/30  Meropenem 9/30 >>    Microbiology results: 9/25 BCx: Enterobacter Cloacae, S - Cefepime, Gentamicin, Imipenem, Septra   Pharmacy will continue to monitor and  adjust per consult.   Simpson,Domine L 12/31/2015 2:43 PM

## 2015-12-31 NOTE — Progress Notes (Signed)
[facility]  ROUNDING NOTE   Subjective:  Patient quite lethargic today. She tolerated peritoneal dialysis well yesterday. Ultrafiltration achieved was 961 mL.   Objective:  Vital signs in last 24 hours:  Temp:  [97.8 F (36.6 C)-98.6 F (37 C)] 98.4 F (36.9 C) (10/01 1225) Pulse Rate:  [75-100] 92 (10/01 1225) Resp:  [18-20] 20 (10/01 1225) BP: (106-154)/(56-87) 154/56 (10/01 1225) SpO2:  [96 %-100 %] 97 % (10/01 1225) FiO2 (%):  [28 %] 28 % (09/30 1801) Weight:  [116.3 kg (256 lb 6.4 oz)] 116.3 kg (256 lb 6.4 oz) (10/01 0500)  Weight change: -6.098 kg (-13 lb 7.1 oz) Filed Weights   12/30/15 0500 12/30/15 0535 12/31/15 0500  Weight: 122.4 kg (269 lb 13.5 oz) 121.9 kg (268 lb 11.2 oz) 116.3 kg (256 lb 6.4 oz)    Intake/Output: I/O last 3 completed shifts: In: 340 [P.O.:240; IV Piggyback:100] Out: 890 [Other:890]   Intake/Output this shift:  Total I/O In: -  Out: 961 [Other:961]  Physical Exam: General: No acute distress  Head: Normocephalic, atraumatic. Moist oral mucosal membranes  Eyes: Anicteric  Neck: Supple, trachea midline  Lungs:  Clear to auscultation, normal effort  Heart: S1S2 no rubs  Abdomen:  Soft, nontender, BS present, PD catheter in place  Extremities: RLE amputation noted, LLE edema noted  Neurologic: Nonfocal, moving all four extremities  Skin: No lesions  Access: PD cath in place    Basic Metabolic Panel:  Recent Labs Lab 12/25/15 1434 12/26/15 0431 12/28/15 1706 12/29/15 0425  NA 131* 131*  --  132*  K 5.0 5.2*  --  5.0  CL 96* 98*  --  95*  CO2 28 26  --  30  GLUCOSE 132* 186*  --  198*  BUN 64* 68*  --  66*  CREATININE 5.65* 6.05*  --  5.90*  CALCIUM 9.3 9.1  --  10.2  PHOS  --   --  3.7  --     Liver Function Tests:  Recent Labs Lab 12/25/15 1434  AST 17  ALT 9*  ALKPHOS 66  BILITOT 0.5  PROT 5.0*  ALBUMIN 2.2*   No results for input(s): LIPASE, AMYLASE in the last 168 hours. No results for  input(s): AMMONIA in the last 168 hours.  CBC:  Recent Labs Lab 12/25/15 1434 12/26/15 0431 12/27/15 1347 12/29/15 0425  WBC 11.0 11.0 6.3 6.0  NEUTROABS 9.6*  --   --   --   HGB 7.7* 7.4* 7.1* 7.5*  HCT 23.5* 22.5* 21.4* 22.2*  MCV 95.2 96.4 94.3 91.8  PLT 226 207 200 264    Cardiac Enzymes:  Recent Labs Lab 12/25/15 1434 12/25/15 2036 12/26/15 0120 12/26/15 0431  TROPONINI 0.66* 1.01* 0.63* 0.54*    BNP: Invalid input(s): POCBNP  CBG:  Recent Labs Lab 12/30/15 1611 12/30/15 2107 12/31/15 0741 12/31/15 1220 12/31/15 1645  GLUCAP 153* 201* 165* 145* 134*    Microbiology: Results for orders placed or performed during the hospital encounter of 12/25/15  Blood culture (routine x 2)     Status: Abnormal   Collection Time: 12/25/15  2:32 PM  Result Value Ref Range Status   Specimen Description BLOOD  L HAND  Final   Special Requests   Final    BOTTLES DRAWN AEROBIC AND ANAEROBIC  AER 12 ML ANA 14 ML   Culture  Setup Time   Final    GRAM NEGATIVE RODS IN BOTH AEROBIC AND ANAEROBIC BOTTLES CRITICAL VALUE NOTED.  VALUE IS CONSISTENT WITH PREVIOUSLY REPORTED AND CALLED VALUE. SDR    Culture (A)  Final    ENTEROBACTER CLOACAE SUSCEPTIBILITIES PERFORMED ON PREVIOUS CULTURE WITHIN THE LAST 5 DAYS. Performed at [facility]    Report Status 12/30/2015 FINAL  Final  Blood culture (routine x 2)     Status: Abnormal   Collection Time: 12/25/15  2:32 PM  Result Value Ref Range Status   Specimen Description BLOOD  L FA  Final   Special Requests   Final    BOTTLES DRAWN AEROBIC AND ANAEROBIC  AER 12 ML ANA 12 ML   Culture  Setup Time   Final    GRAM NEGATIVE RODS IN BOTH AEROBIC AND ANAEROBIC BOTTLES CRITICAL RESULT CALLED TO, READ BACK BY AND VERIFIED WITH: NATE COOKSON AT 0535 12/26/15 SDR Performed at [facility]    Culture ENTEROBACTER CLOACAE (A)  Final   Report Status 12/30/2015 FINAL  Final   Organism ID, Bacteria ENTEROBACTER CLOACAE   Final      Susceptibility   Enterobacter cloacae - MIC*    CEFAZOLIN >=64 RESISTANT Resistant     CEFEPIME <=1 SENSITIVE Sensitive     CEFTAZIDIME 16 INTERMEDIATE Intermediate     CEFTRIAXONE >=64 RESISTANT Resistant     CIPROFLOXACIN >=4 RESISTANT Resistant     GENTAMICIN <=1 SENSITIVE Sensitive     IMIPENEM <=0.25 SENSITIVE Sensitive     TRIMETH/SULFA <=20 SENSITIVE Sensitive     PIP/TAZO >=128 RESISTANT Resistant     * ENTEROBACTER CLOACAE  Blood Culture ID Panel (Reflexed)     Status: Abnormal   Collection Time: 12/25/15  2:32 PM  Result Value Ref Range Status   Enterococcus species NOT DETECTED NOT DETECTED Final   Listeria monocytogenes NOT DETECTED NOT DETECTED Final   Staphylococcus species NOT DETECTED NOT DETECTED Final   Staphylococcus aureus NOT DETECTED NOT DETECTED Final   Streptococcus species NOT DETECTED NOT DETECTED Final   Streptococcus agalactiae NOT DETECTED NOT DETECTED Final   Streptococcus pneumoniae NOT DETECTED NOT DETECTED Final   Streptococcus pyogenes NOT DETECTED NOT DETECTED Final   Acinetobacter baumannii NOT DETECTED NOT DETECTED Final   Enterobacteriaceae species DETECTED (A) NOT DETECTED Final    Comment: CRITICAL RESULT CALLED TO, READ BACK BY AND VERIFIED WITH:  NATE COOKSON AT 0535 12/26/15 SDR    Enterobacter cloacae complex DETECTED (A) NOT DETECTED Corrected    Comment: CRITICAL RESULT CALLED TO, READ BACK BY AND VERIFIED WITH: K MERRILL 12/28/15 @ 1210 M VESTAL CORRECTED ON 09/28 AT 1211: PREVIOUSLY REPORTED AS NOT DETECTED    Escherichia coli NOT DETECTED NOT DETECTED Corrected    Comment: PREVIOUSLY REPORTED AS: DETECTED CORRECTED RESULTS CALLED TO: K MERRILL 12/28/15 @ 1210 M VESTAL Performed at [facility] CORRECTED ON 09/28 AT 1211: PREVIOUSLY REPORTED AS DETECTED CRITICAL RESULT CALLED TO, READ BACK BY AND VERIFIED WITH:  NATE COOKSON AT 0535 12/26/15 SDR    Klebsiella oxytoca NOT DETECTED NOT DETECTED Final   Klebsiella  pneumoniae NOT DETECTED NOT DETECTED Final   Proteus species NOT DETECTED NOT DETECTED Final   Serratia marcescens NOT DETECTED NOT DETECTED Final   Carbapenem resistance NOT DETECTED NOT DETECTED Final   Haemophilus influenzae NOT DETECTED NOT DETECTED Final   Neisseria meningitidis NOT DETECTED NOT DETECTED Final   Pseudomonas aeruginosa NOT DETECTED NOT DETECTED Final   Candida albicans NOT DETECTED NOT DETECTED Final   Candida glabrata NOT DETECTED NOT DETECTED Final   Candida krusei NOT DETECTED  NOT DETECTED Final   Candida parapsilosis NOT DETECTED NOT DETECTED Final   Candida tropicalis NOT DETECTED NOT DETECTED Final  Culture, body fluid-bottle     Status: None   Collection Time: 12/25/15  4:00 PM  Result Value Ref Range Status   Specimen Description FLUID PERITONEAL DIALYSATE  Final   Special Requests BOTTLES DRAWN AEROBIC AND ANAEROBIC 10CC  Final   Culture   Final    NO GROWTH 5 DAYS Performed at [facility]    Report Status 12/30/2015 FINAL  Final  Gram stain     Status: None   Collection Time: 12/25/15  4:00 PM  Result Value Ref Range Status   Specimen Description FLUID PERITONEAL DIALYSATE  Final   Special Requests NONE  Final   Gram Stain   Final    FEW WBC PRESENT,BOTH PMN AND MONONUCLEAR NO ORGANISMS SEEN Performed at [facility]    Report Status 12/27/2015 FINAL  Final  MRSA PCR Screening     Status: Abnormal   Collection Time: 12/26/15  1:34 PM  Result Value Ref Range Status   MRSA by PCR POSITIVE (A) NEGATIVE Final    Comment:        The GeneXpert MRSA Assay (FDA approved for NASAL specimens only), is one component of a comprehensive MRSA colonization surveillance program. It is not intended to diagnose MRSA infection nor to guide or monitor treatment for MRSA infections. RESULT CALLED TO, READ BACK BY AND VERIFIED WITH: AMY TODD RN AT 1455 12/26/15 MSS.   Culture, body fluid-bottle     Status: None (Preliminary result)    Collection Time: 12/27/15  4:15 PM  Result Value Ref Range Status   Specimen Description PERITONEAL DIALYSATE  Final   Special Requests NONE  Final   Culture   Final    NO GROWTH 4 DAYS Performed at [facility]    Report Status PENDING  Incomplete  Gram stain     Status: None   Collection Time: 12/27/15  4:15 PM  Result Value Ref Range Status   Specimen Description PERITONEAL DIALYSATE  Final   Special Requests NONE  Final   Gram Stain   Final    WBC PRESENT,BOTH PMN AND MONONUCLEAR NO ORGANISMS SEEN CYTOSPIN Performed at [facility]    Report Status 12/27/2015 FINAL  Final  CULTURE, BLOOD (ROUTINE X 2) w Reflex to ID Panel     Status: None (Preliminary result)   Collection Time: 12/28/15  7:19 PM  Result Value Ref Range Status   Specimen Description BLOOD RIGHT AC  Final   Special Requests BOTTLES DRAWN AEROBIC AND ANAEROBIC 10 ML  Final   Culture NO GROWTH 3 DAYS  Final   Report Status PENDING  Incomplete    Coagulation Studies: No results for input(s): LABPROT, INR in the last 72 hours.  Urinalysis: No results for input(s): COLORURINE, LABSPEC, PHURINE, GLUCOSEU, HGBUR, BILIRUBINUR, KETONESUR, PROTEINUR, UROBILINOGEN, NITRITE, LEUKOCYTESUR in the last 72 hours.  Invalid input(s): APPERANCEUR    Imaging: Ct Head Wo Contrast  Result Date: 12/30/2015 CLINICAL DATA:  Altered mental status EXAM: CT HEAD WITHOUT CONTRAST TECHNIQUE: Contiguous axial images were obtained from the base of the skull through the vertex without intravenous contrast. COMPARISON:  07/21/2015 FINDINGS: Brain: Mild global atrophy. Chronic ischemic changes in the periventricular white matter. No mass effect, midline shift, or acute hemorrhage. Vascular: Carotid siphon calcifications Skull: Intact Sinuses/Orbits: Mucosal thickening in the left maxillary sinus. Other: Noncontributory IMPRESSION: No acute intracranial  pathology.  Chronic changes. Electronically Signed   By: Jolaine ClickArthur  Hoss M.D.    On: 12/30/2015 14:31     Medications:     . calcium acetate  2,001 mg Oral TID WC  . calcium carbonate  1 tablet Oral TID WC  . clopidogrel  75 mg Oral Daily  . colesevelam  625 mg Oral BID WC  . dialysis solution 2.5% low-MG/low-CA   Intraperitoneal Q24H  . docusate sodium  100 mg Oral QHS  . epoetin (EPOGEN/PROCRIT) injection  20,000 Units Subcutaneous Weekly  . feeding supplement (PRO-STAT SUGAR FREE 64)  30 mL Oral TID WC  . gentamicin cream  1 application Topical Daily  . heparin subcutaneous  5,000 Units Subcutaneous Q8H  . hydrALAZINE  25 mg Oral Daily  . insulin aspart  0-10 Units Subcutaneous TID WC  . insulin glargine  8 Units Subcutaneous QHS  . isosorbide mononitrate  60 mg Oral Daily  . lactulose  10 g Oral Daily  . levETIRAcetam  500 mg Oral q morning - 10a  . lisinopril  40 mg Oral Daily  . [START ON 01/01/2016] meropenem (MERREM) IV  1 g Intravenous Q24H  . metaxalone  800 mg Oral BID  . metoprolol  50 mg Oral BID  . mupirocin ointment  1 application Nasal BID  . nicotine  14 mg Transdermal Daily  . nystatin  1 g Topical BID  . pantoprazole  40 mg Oral Daily  . polyethylene glycol  17 g Oral Daily  . ranolazine  500 mg Oral BID  . senna  2 tablet Oral BID  . sodium chloride flush  3 mL Intravenous Q12H  . torsemide  100 mg Oral Daily  . white petrolatum   Topical Q72H   acetaminophen **OR** acetaminophen, bisacodyl, metolazone, metoprolol, morphine injection, nitroGLYCERIN, ondansetron **OR** ondansetron (ZOFRAN) IV, oxyCODONE-acetaminophen  Assessment/ Plan:  50 y.o. female with end-stage renal disease on hemodialysis, asthma, gastroparesis, diabetes mellitus type II, congestive heart failure, hypertension, endometriosis, coronary atherosclerosis  [facility]/ [address]/ TTS RIJ permcath  1. End-stage renal disease on hemodialysis dialysis, TTHS: PermCath has been removed.  We have temporarily converted the patient to peritoneal dialysis as  there is no hemodialysis access in place at the moment.   - We will maintain the patient on peritoneal dialysis for now. She will need to be converted back to hemodialysis sometime next week. Ultrafiltration with peritoneal dialysis achieved yesterday was 961 cc. Continue peritoneal dialysis tonight.  2. Anemia of CKD: Continue Epogen 20,000 units subcutaneous weekly.  3. Sepsis: 2 blood cultures are positive.  Enterobacter and Escherichia coli noted.   -  Appreciate infectious disease consultation. Maintain the patient on levofloxacin for a total of 14 days. PermCath can be replaced once repeat cultures remain negative for 48 hours.  We will consult with vascular surgery tomorrow for this issue.  4. Secondary Hyperparathyroidism:  Patient taken off of calcitriol as PTH was previously low.  - Continue calcium acetate 3 tablets by mouth 3 times a day with meals.   LOS: 4 Yacine Garriga 10/1/20175:39 PM

## 2015-12-31 NOTE — Progress Notes (Signed)
SUBJECTIVE: Patient is lying comfortably sleeping in bed   Vitals:   12/30/15 1957 12/31/15 0500 12/31/15 0547 12/31/15 1045  BP: (!) 141/70  (!) 142/72 106/87  Pulse: 92  75 100  Resp: 18  18   Temp: 98.6 F (37 C)  97.8 F (36.6 C)   TempSrc: Oral  Oral   SpO2: 96%  100% 100%  Weight:  256 lb 6.4 oz (116.3 kg)    Height:        Intake/Output Summary (Last 24 hours) at 12/31/15 1051 Last data filed at 12/31/15 0955  Gross per 24 hour  Intake                0 ml  Output              961 ml  Net             -961 ml    LABS: Basic Metabolic Panel:  Recent Labs  16/07/37 1706 12/29/15 0425  NA  --  132*  K  --  5.0  CL  --  95*  CO2  --  30  GLUCOSE  --  198*  BUN  --  66*  CREATININE  --  5.90*  CALCIUM  --  10.2  PHOS 3.7  --    Liver Function Tests: No results for input(s): AST, ALT, ALKPHOS, BILITOT, PROT, ALBUMIN in the last 72 hours. No results for input(s): LIPASE, AMYLASE in the last 72 hours. CBC:  Recent Labs  12/29/15 0425  WBC 6.0  HGB 7.5*  HCT 22.2*  MCV 91.8  PLT 264   Cardiac Enzymes: No results for input(s): CKTOTAL, CKMB, CKMBINDEX, TROPONINI in the last 72 hours. BNP: Invalid input(s): POCBNP D-Dimer: No results for input(s): DDIMER in the last 72 hours. Hemoglobin A1C: No results for input(s): HGBA1C in the last 72 hours. Fasting Lipid Panel: No results for input(s): CHOL, HDL, LDLCALC, TRIG, CHOLHDL, LDLDIRECT in the last 72 hours. Thyroid Function Tests: No results for input(s): TSH, T4TOTAL, T3FREE, THYROIDAB in the last 72 hours.  Invalid input(s): FREET3 Anemia Panel: No results for input(s): VITAMINB12, FOLATE, FERRITIN, TIBC, IRON, RETICCTPCT in the last 72 hours.   PHYSICAL EXAM General: Well developed, well nourished, in no acute distress HEENT:  Normocephalic and atramatic Neck:  No JVD.  Lungs: Clear bilaterally to auscultation and percussion. Heart: HRRR . Normal S1 and S2 without gallops or murmurs.   Abdomen: Bowel sounds are positive, abdomen soft and non-tender  Msk:  Back normal, normal gait. Normal strength and tone for age. Extremities: No clubbing, cyanosis or edema.   Neuro: Alert and oriented X 3. Psych:  Good affect, responds appropriately  TELEMETRY:Sinus rhythm  ASSESSMENT AND PLAN: Mildly elevated troponin with chest pain and coronary artery disease status post PCI and stenting with cardiac catheterization just a month ago showing no significant restenosis. Patient presented with sepsis of Escherichia coli and is getting treated for that. No further chest pain. Will monitor the patient and do outpatient workup.  Active Problems:   Chest pain   Bacteremia    Rosanne Wohlfarth A, MD, [facility] 12/31/2015 10:51 AM

## 2015-12-31 NOTE — Progress Notes (Signed)
Patient ID: Kimberly Montgomery, female   DOB: 03/04/1966, 50 y.o.   MRN: [id number]   [facility] [place] [facility]   PATIENT NAME: Kimberly Montgomery    MR#:  [id number]  DATE OF BIRTH:  12/07/1965  SUBJECTIVE: Neck pain. Patient having shaking of both hands. Alert, awake. Patient says that she gets the shakes as sometimes. Spoke with Daughter yesterday, concerned about her mother overall mental condition.   CHIEF COMPLAINT:   Chief Complaint  Patient presents with  . Chest Pain  50 year old female with past medical history of end-stage renal disease on hemodialysis, hypertension, hyperlipidemia, peripheral vascular disease, diabetes, history of coronary artery disease who Was admitted to the hospital due to chest pain. Found to have bacteremia with Enterobacter, permacath removed. Now complains of neck pain today. On Levaquin for antibiotic for bacteremia.  REVIEW OF SYSTEMS:  ROS CONSTITUTIONAL: No fever/chills, fatigue, weakness, weight gain/loss, headache. EYES: No blurry or double vision. ENT: No tinnitus, postnasal drip, redness or soreness of the oropharynx.has neck pain, RESPIRATORY: No cough, dyspnea, wheeze, hemoptysis.  CARDIOVASCULAR: No chest pain, palpitations, syncope, orthopnea,  GASTROINTESTINAL: No nausea, vomiting, constipation, diarrhea, abdominal pain, hematemesis, melena or hematochezia. GENITOURINARY: No dysuria, frequency, hematuria. ENDOCRINE: No polyuria or nocturia. No heat or cold intolerance. HEMATOLOGY: No anemia, bruising, bleeding. INTEGUMENTARY: No rashes, ulcers, lesions. MUSCULOSKELETAL: No arthritis, gout, edema. NEUROLOGIC: No numbness, tingling, ataxia, seizure-type activity, weakness. PSYCHIATRIC: No anxiety, depression, insomnia.   DRUG ALLERGIES:   Allergies  Allergen Reactions  . Cephalosporins Anaphylaxis  . Penicillins Anaphylaxis and Other (See Comments)    Has patient had a PCN reaction causing immediate rash,  facial/tongue/throat swelling, SOB or lightheadedness with hypotension: Yes Has patient had a PCN reaction causing severe rash involving mucus membranes or skin necrosis: No Has patient had a PCN reaction that required hospitalization No Has patient had a PCN reaction occurring within the last 10 years: No If all of the above answers are "NO", then may proceed with Cephalosporin use.  . Lamictal [Lamotrigine] Other (See Comments)    Reaction:  Hallucinations  . Phenergan [Promethazine Hcl] Nausea And Vomiting  . Pravastatin Other (See Comments)    Reaction:  Muscle pain   . Sulfa Antibiotics Other (See Comments)    Reaction:  Unknown    VITALS:  Blood pressure 106/87, pulse 100, temperature 97.8 F (36.6 C), temperature source Oral, resp. rate 18, height 5\' 10"  (1.778 m), weight 116.3 kg (256 lb 6.4 oz), last menstrual period 03/16/2015, SpO2 100 %. PHYSICAL EXAMINATION:  Physical Exam  GENERAL:  50 y.o.-year-old white female patient lying in the bed in no acute distress. Appears chronically ill.  EYES: Pupils equal, round, reactive to light and accommodation. No scleral icterus. Extraocular muscles intact.  HEENT: Head atraumatic, normocephalic. Oropharynx and nasopharynx clear. No oropharyngeal erythema, moist oral mucosa  NECK:  tight neck muscle on right side.  no jugular venous distention. No thyroid enlargement, no tenderness.  LUNGS: Normal breath sounds bilaterally, no wheezing, rales, rhonchi. No use of accessory muscles of respiration.  CARDIOVASCULAR: S1, S2 RRR. No murmurs, rubs, gallops, clicks. Right chest wall dialysis catheter in place ABDOMEN: Soft, nontender, nondistended. Bowel sounds present. No organomegaly or mass. Positive peritoneal dialysis catheter in place without acute drainage. EXTREMITIES: No pedal edema, cyanosis, or clubbing. + 2 pedal & radial pulses b/l.   NEUROLOGIC: Cranial nerves II through XII are intact. No focal Motor or sensory deficits appreciated  b/l.  Globally weak.   PSYCHIATRIC:  The patient is alert and oriented x 2.  SKIN: No obvious rash, lesion, or ulcer.    LABORATORY PANEL:   CBC  Recent Labs Lab 12/29/15 0425  WBC 6.0  HGB 7.5*  HCT 22.2*  PLT 264   ------------------------------------------------------------------------------------------------------------------ Chemistries   Recent Labs Lab 12/25/15 1434  12/29/15 0425  NA 131*  < > 132*  K 5.0  < > 5.0  CL 96*  < > 95*  CO2 28  < > 30  GLUCOSE 132*  < > 198*  BUN 64*  < > 66*  CREATININE 5.65*  < > 5.90*  CALCIUM 9.3  < > 10.2  AST 17  --   --   ALT 9*  --   --   ALKPHOS 66  --   --   BILITOT 0.5  --   --   < > = values in this interval not displayed. RADIOLOGY:  Ct Head Wo Contrast  Result Date: 12/30/2015 CLINICAL DATA:  Altered mental status EXAM: CT HEAD WITHOUT CONTRAST TECHNIQUE: Contiguous axial images were obtained from the base of the skull through the vertex without intravenous contrast. COMPARISON:  07/21/2015 FINDINGS: Brain: Mild global atrophy. Chronic ischemic changes in the periventricular white matter. No mass effect, midline shift, or acute hemorrhage. Vascular: Carotid siphon calcifications Skull: Intact Sinuses/Orbits: Mucosal thickening in the left maxillary sinus. Other: Noncontributory IMPRESSION: No acute intracranial pathology.  Chronic changes. Electronically Signed   By: Jolaine Click M.D.   On: 12/30/2015 14:31   ASSESSMENT AND PLAN:   1. Chest pain with elevated troponin which is trending down. 1.01-0.63-0.54. Likely secondary to bacteremia: No chest pain today. No further workup . 2. Sepsis - blood cultures positive for enterococcus  Continue Levaquin and follow-up sensitivities. Urinalysis and chest x-ray have been negative. Patient has permacath  removed, Peritoneal fluid cultures are negative to date.rpt blood cultures from 9/28 are negative to date.He seen by ID, continue Levaquin for 14 days. Appreciate nephrology  following  For  permacath placement this week. Slightly elevated troponin secondary to demand ischemia rather than acute MI. Seen by cardiology. 2. End-stage renal disease on hemodialysis-patient gets scheduled dialysis on Tuesday Thursday Saturday.now we removed permacath due to bacteremic.had PD in place,needs HD cath next week for her to go back to NH.  3. Diabetes type 2 without complication-continue Lantus, sliding scale insulin.  4. Essential hypertension-continue lisinopril, metoprolol, Imdur.  5. History of seizures-continue Keppra.  6. GERD-continue Protonix.  7. Secondary hyperparathyroidism-continue calcium acetate.  #8. Chronic deconditioning: Patient says that she is bedbound  #9. Lethargy likely due to sepsis; but to stop the Risperdal, narcotics. She is more alert today . Discussed  the plan yesterday with patient's daughter.   #9 constipation: Continue stool softeners.. Discussed with patient, appreciate presence of  RN  during rounds. #10 neck pain secondary to possible muscle pull: Add Skelaxin. Continue by mouth Percocet, and IV morphine as needed. D/w RN ,nephrologist  All the records are reviewed and case discussed with Care Management/Social Worker. Management plans discussed with the patient, family and they are in agreement.  CODE STATUS: FULL  TOTAL TIME TAKING CARE OF THIS PATIENT: 25 minutes.   More than 50% of the time was spent in counseling/coordination of care: YES  POSSIBLE D/C IN 1-2 DAYS, DEPENDING ON CLINICAL CONDITION.   Katha Hamming M.D on 12/31/2015 at 11:23 AM  Between 7am to 6pm - Pager - [phone]  After 6pm go to [email or web address] - password [email or web address]  [facility] [facility]  Office  [phone]  CC: Primary care physician; Jarome Matin, MD  Note: This dictation was prepared with Dragon dictation along with smaller phrase technology. Any transcriptional errors that result from this process are  unintentional.

## 2016-01-01 ENCOUNTER — Encounter
Admission: EM | Disposition: A | Discharge status: Skilled Nursing Facility | Payer: Self-pay | Source: Home / Self Care | Attending: Internal Medicine

## 2016-01-01 DIAGNOSIS — N186 End stage renal disease: Secondary | ICD-10-CM

## 2016-01-01 HISTORY — PX: PERITONEAL CATHETER REMOVAL: SUR1106

## 2016-01-01 HISTORY — PX: PERIPHERAL VASCULAR CATHETERIZATION: SHX172C

## 2016-01-01 LAB — GLUCOSE, CAPILLARY
GLUCOSE-CAPILLARY: 157 mg/dL — AB (ref 65–99)
GLUCOSE-CAPILLARY: 148 mg/dL — AB (ref 65–99)
Glucose-Capillary: 186 mg/dL — ABNORMAL HIGH (ref 65–99)
Glucose-Capillary: 196 mg/dL — ABNORMAL HIGH (ref 65–99)

## 2016-01-01 LAB — CULTURE, BODY FLUID-BOTTLE

## 2016-01-01 LAB — CULTURE, BODY FLUID W GRAM STAIN -BOTTLE: Culture: NO GROWTH

## 2016-01-01 SURGERY — DIALYSIS/PERMA CATHETER INSERTION
Anesthesia: Moderate Sedation

## 2016-01-01 MED ORDER — HEPARIN SODIUM (PORCINE) 10000 UNIT/ML IJ SOLN
INTRAMUSCULAR | Status: AC
  Filled 2016-01-01: qty 1

## 2016-01-01 MED ORDER — MIDAZOLAM HCL 2 MG/2ML IJ SOLN
INTRAMUSCULAR | Status: DC | PRN
  Administered 2016-01-01: 2 mg via INTRAVENOUS

## 2016-01-01 MED ORDER — SODIUM CHLORIDE 0.9 % IV SOLN
INTRAVENOUS | Status: DC
  Administered 2016-01-01: 13:00:00 via INTRAVENOUS

## 2016-01-01 MED ORDER — FENTANYL CITRATE (PF) 100 MCG/2ML IJ SOLN
INTRAMUSCULAR | Status: DC | PRN
  Administered 2016-01-01: 50 ug via INTRAVENOUS

## 2016-01-01 MED ORDER — FENTANYL CITRATE (PF) 100 MCG/2ML IJ SOLN
INTRAMUSCULAR | Status: AC
  Filled 2016-01-01: qty 2

## 2016-01-01 MED ORDER — CLINDAMYCIN PHOSPHATE 300 MG/50ML IV SOLN
INTRAVENOUS | Status: AC
  Filled 2016-01-01: qty 50

## 2016-01-01 MED ORDER — HEPARIN (PORCINE) IN NACL 2-0.9 UNIT/ML-% IJ SOLN
INTRAMUSCULAR | Status: AC
  Filled 2016-01-01: qty 500

## 2016-01-01 MED ORDER — CALCIUM ACETATE (PHOS BINDER) 667 MG PO CAPS
1334.0000 mg | ORAL_CAPSULE | Freq: Three times a day (TID) | ORAL | Status: DC
  Administered 2016-01-01 – 2016-01-04 (×6): 1334 mg via ORAL
  Filled 2016-01-01 (×7): qty 2

## 2016-01-01 MED ORDER — MIDAZOLAM HCL 2 MG/2ML IJ SOLN
INTRAMUSCULAR | Status: AC
  Filled 2016-01-01: qty 2

## 2016-01-01 MED ORDER — LIDOCAINE-EPINEPHRINE (PF) 1 %-1:200000 IJ SOLN
INTRAMUSCULAR | Status: AC
  Filled 2016-01-01: qty 30

## 2016-01-01 SURGICAL SUPPLY — 4 items
CANNULA 5F STIFF (CANNULA) ×3 IMPLANT
CATH PALINDROME RT-P 15FX23CM (CATHETERS) ×3 IMPLANT
PACK ANGIOGRAPHY (CUSTOM PROCEDURE TRAY) ×3 IMPLANT
WIRE J 3MM .035X145CM (WIRE) ×3 IMPLANT

## 2016-01-01 NOTE — Progress Notes (Signed)
Pt clinically stable post perm cath placement left s/c, vss, arouses readily, denies complaints, vss, Dr Wyn Quaker out to speak with patient, report called to Steve/care nurse on telemetry, plan reviewed.sr per monitor.

## 2016-01-01 NOTE — H&P (Signed)
[facility] History & Physical Update  The patient was interviewed and re-examined.  The patient's previous History and Physical has been reviewed and is unchanged.  There is no change in the plan of care and we will proceed with Permcath placement at the request of nephrology. We plan to proceed with the scheduled procedure.  Festus Barren, MD  01/01/2016, 2:50 PM

## 2016-01-01 NOTE — Progress Notes (Signed)
CSW requested a PT consult for patient. Patient to discharge to [facility] when stable and will need a PT evaluation for SNF placement.. CSW will continue to follow and assist.  Woodroe Mode, MSW, LCSW, LCAS-A Clinical Social Worker [phone]

## 2016-01-01 NOTE — Progress Notes (Signed)
Patient ID: Kimberly Montgomery, female   DOB: 06/24/1965, 50 y.o.   MRN: [id number]   [facility] -  [facility]   PATIENT NAME: Kimberly Montgomery    MR#:  [id number]  DATE OF BIRTH:  10/30/1965  SUBJECTIVE: Patient is more awake and alert today.  watching TV.   CHIEF COMPLAINT:   Chief Complaint  Patient presents with  . Chest Pain  50 year old female with past medical history of end-stage renal disease on hemodialysis, hypertension, hyperlipidemia, peripheral vascular disease, diabetes, history of coronary artery disease who Was admitted to the hospital due to chest pain. Found to have bacteremia with Enterobacter, permacath removed. For new permacath placement today. REVIEW OF SYSTEMS:  ROS CONSTITUTIONAL: No fever/chills, fatigue, weakness, weight gain/loss, headache. EYES: No blurry or double vision. ENT: No tinnitus, postnasal drip, redness or soreness of the oropharynx.has neck pain, RESPIRATORY: No cough, dyspnea, wheeze, hemoptysis.  CARDIOVASCULAR: No chest pain, palpitations, syncope, orthopnea,  GASTROINTESTINAL: No nausea, vomiting, constipation, diarrhea, abdominal pain, hematemesis, melena or hematochezia. GENITOURINARY: No dysuria, frequency, hematuria. ENDOCRINE: No polyuria or nocturia. No heat or cold intolerance. HEMATOLOGY: No anemia, bruising, bleeding. INTEGUMENTARY: No rashes, ulcers, lesions. MUSCULOSKELETAL: No arthritis, gout, edema. NEUROLOGIC: No numbness, tingling, ataxia, seizure-type activity, weakness. PSYCHIATRIC: No anxiety, depression, insomnia.   DRUG ALLERGIES:   Allergies  Allergen Reactions  . Cephalosporins Anaphylaxis  . Penicillins Anaphylaxis and Other (See Comments)    Has patient had a PCN reaction causing immediate rash, facial/tongue/throat swelling, SOB or lightheadedness with hypotension: Yes Has patient had a PCN reaction causing severe rash involving mucus membranes or skin necrosis: No Has patient had a PCN  reaction that required hospitalization No Has patient had a PCN reaction occurring within the last 10 years: No If all of the above answers are "NO", then may proceed with Cephalosporin use.  . Lamictal [Lamotrigine] Other (See Comments)    Reaction:  Hallucinations  . Phenergan [Promethazine Hcl] Nausea And Vomiting  . Pravastatin Other (See Comments)    Reaction:  Muscle pain   . Sulfa Antibiotics Other (See Comments)    Reaction:  Unknown    VITALS:  Blood pressure (!) 156/57, pulse 65, temperature 97.4 F (36.3 C), temperature source Oral, resp. rate 17, height 5\' 10"  (1.778 m), weight 122.2 kg (269 lb 6.4 oz), last menstrual period 03/16/2015, SpO2 99 %. PHYSICAL EXAMINATION:  Physical Exam  GENERAL:  50 y.o.-year-old white female patient lying in the bed in no acute distress. Appears chronically ill.  EYES: Pupils equal, round, reactive to light and accommodation. No scleral icterus. Extraocular muscles intact.  HEENT: Head atraumatic, normocephalic. Oropharynx and nasopharynx clear. No oropharyngeal erythema, moist oral mucosa  NECK:  .  no jugular venous distention. No thyroid enlargement, no tenderness.  LUNGS: Normal breath sounds bilaterally, no wheezing, rales, rhonchi. No use of accessory muscles of respiration.  CARDIOVASCULAR: S1, S2 RRR. No murmurs, rubs, gallops, clicks. Right chest wall dialysis catheter in place ABDOMEN: Soft, nontender, nondistended. Bowel sounds present. No organomegaly or mass. Positive peritoneal dialysis catheter in place without acute drainage. EXTREMITIES: No pedal edema, cyanosis, or clubbing. + 2 pedal & radial pulses b/l.   NEUROLOGIC: Cranial nerves II through XII are intact. No focal Motor or sensory deficits appreciated b/l.  Globally weak.   PSYCHIATRIC: The patient is alert and oriented x 2.  SKIN: No obvious rash, lesion, or ulcer.    LABORATORY PANEL:   CBC  Recent Labs Lab 12/29/15 0425  WBC 6.0  HGB 7.5*  HCT 22.2*  PLT  264   ------------------------------------------------------------------------------------------------------------------ Chemistries   Recent Labs Lab 12/25/15 1434  12/29/15 0425  NA 131*  < > 132*  K 5.0  < > 5.0  CL 96*  < > 95*  CO2 28  < > 30  GLUCOSE 132*  < > 198*  BUN 64*  < > 66*  CREATININE 5.65*  < > 5.90*  CALCIUM 9.3  < > 10.2  AST 17  --   --   ALT 9*  --   --   ALKPHOS 66  --   --   BILITOT 0.5  --   --   < > = values in this interval not displayed. RADIOLOGY:  No results found. ASSESSMENT AND PLAN:   1. Chest pain with elevated troponin which is trending down. 1.01-0.63-0.54. Likely secondary to bacteremia: No chest pain today. No further workup . 2. Sepsis - blood cultures positive for enterococcus  Continue Levaquin and follow-up sensitivities. Urinalysis and chest x-ray have been negative. Patient has permacath  removed, Peritoneal fluid cultures are negative to date.rpt blood cultures from 9/28 are negative to date.He seen by ID, continue Levaquin for 14 days. Appreciate nephrology following  New permacath placement today. Pt wants to go to [facility]. Slightly elevated troponin secondary to demand ischemia rather than acute MI. Seen by cardiology. 2. End-stage renal disease on hemodialysis-patient gets scheduled dialysis on Tuesday Thursday Saturday.now we removed permacath due to bacteremic.had PD in place,' going  for  New permacath placement for in preparation for discharge to nursing home 3. Diabetes type 2 without complication-continue Lantus, sliding scale insulin.  4. Essential hypertension-continue lisinopril, metoprolol, Imdur.  5. History of seizures-continue Keppra.  6. GERD-continue Protonix.  7. Secondary hyperparathyroidism-continue calcium acetate.  #8. Chronic deconditioning: Patient says that she is bedbound .  physical therapy  consult today #9. Lethargy likely due to sepsis; but to stop the Risperdal, narcotics. She is more  alert today . Discussed  the plan yesterday with patient's daughter.   #9 constipation: Continue stool softeners.. Discussed with patient, appreciate presence of  RN  during rounds. #10 neck pain secondary to possible muscle pull: Add Skelaxin. Continue by mouth Percocet, and IV morphine as needed. D/w RN ,nephrologist  All the records are reviewed and case discussed with Care Management/Social Worker. Management plans discussed with the patient, family and they are in agreement.  CODE STATUS: FULL  TOTAL TIME TAKING CARE OF THIS PATIENT: 25 minutes.   More than 50% of the time was spent in counseling/coordination of care: YES  POSSIBLE D/C IN 1-2 DAYS, DEPENDING ON CLINICAL CONDITION.   Katha Hamming M.D on 01/01/2016 at 1:54 PM  Between 7am to 6pm - Pager - [phone]  After 6pm go to [email or web address] - Social research officer, government  [facility]  Office  [phone]  CC: Primary care physician; Jarome Matin, MD  Note: This dictation was prepared with Dragon dictation along with smaller phrase technology. Any transcriptional errors that result from this process are unintentional.

## 2016-01-01 NOTE — Progress Notes (Signed)
[facility]  ROUNDING NOTE   Subjective:   Sitting up in bed.  Peritoneal dialysis last night. Currently without a HD access.    Objective:  Vital signs in last 24 hours:  Temp:  [97.7 F (36.5 C)-98.4 F (36.9 C)] 97.7 F (36.5 C) (10/02 0436) Pulse Rate:  [65-100] 65 (10/02 0948) Resp:  [18-20] 18 (10/01 1945) BP: (106-154)/(52-87) 149/52 (10/02 0948) SpO2:  [97 %-100 %] 100 % (10/02 0948) Weight:  [122.2 kg (269 lb 6.4 oz)] 122.2 kg (269 lb 6.4 oz) (10/02 0507)  Weight change: 5.898 kg (13 lb) Filed Weights   12/30/15 0535 12/31/15 0500 01/01/16 0507  Weight: 121.9 kg (268 lb 11.2 oz) 116.3 kg (256 lb 6.4 oz) 122.2 kg (269 lb 6.4 oz)    Intake/Output: I/O last 3 completed shifts: In: -  Out: 961 [Other:961]   Intake/Output this shift:  No intake/output data recorded.  Physical Exam: General: Sitting up in bed  Head: Normocephalic, atraumatic. Moist oral mucosal membranes  Eyes: Anicteric  Neck: Supple, trachea midline  Lungs:  Clear to auscultation, normal effort  Heart: S1S2 no rubs  Abdomen:  Soft, nontender, BS present, PD catheter in place  Extremities: RLE BKA, bilateral 1+ lower extremity edema  Neurologic: Nonfocal, moving all four extremities  Skin: No lesions  Access: PD cath in place    Basic Metabolic Panel:  Recent Labs Lab 12/25/15 1434 12/26/15 0431 12/28/15 1706 12/29/15 0425  NA 131* 131*  --  132*  K 5.0 5.2*  --  5.0  CL 96* 98*  --  95*  CO2 28 26  --  30  GLUCOSE 132* 186*  --  198*  BUN 64* 68*  --  66*  CREATININE 5.65* 6.05*  --  5.90*  CALCIUM 9.3 9.1  --  10.2  PHOS  --   --  3.7  --     Liver Function Tests:  Recent Labs Lab 12/25/15 1434  AST 17  ALT 9*  ALKPHOS 66  BILITOT 0.5  PROT 5.0*  ALBUMIN 2.2*   No results for input(s): LIPASE, AMYLASE in the last 168 hours. No results for input(s): AMMONIA in the last 168 hours.  CBC:  Recent Labs Lab 12/25/15 1434 12/26/15 0431 12/27/15 1347  12/29/15 0425  WBC 11.0 11.0 6.3 6.0  NEUTROABS 9.6*  --   --   --   HGB 7.7* 7.4* 7.1* 7.5*  HCT 23.5* 22.5* 21.4* 22.2*  MCV 95.2 96.4 94.3 91.8  PLT 226 207 200 264    Cardiac Enzymes:  Recent Labs Lab 12/25/15 1434 12/25/15 2036 12/26/15 0120 12/26/15 0431  TROPONINI 0.66* 1.01* 0.63* 0.54*    BNP: Invalid input(s): POCBNP  CBG:  Recent Labs Lab 12/31/15 0741 12/31/15 1220 12/31/15 1645 12/31/15 2119 01/01/16 0740  GLUCAP 165* 145* 134* 140* 186*    Microbiology: Results for orders placed or performed during the hospital encounter of 12/25/15  Blood culture (routine x 2)     Status: Abnormal   Collection Time: 12/25/15  2:32 PM  Result Value Ref Range Status   Specimen Description BLOOD  L HAND  Final   Special Requests   Final    BOTTLES DRAWN AEROBIC AND ANAEROBIC  AER 12 ML ANA 14 ML   Culture  Setup Time   Final    GRAM NEGATIVE RODS IN BOTH AEROBIC AND ANAEROBIC BOTTLES CRITICAL VALUE NOTED.  VALUE IS CONSISTENT WITH PREVIOUSLY REPORTED AND CALLED VALUE. SDR  Culture (A)  Final    ENTEROBACTER CLOACAE SUSCEPTIBILITIES PERFORMED ON PREVIOUS CULTURE WITHIN THE LAST 5 DAYS. Performed at [facility]    Report Status 12/30/2015 FINAL  Final  Blood culture (routine x 2)     Status: Abnormal   Collection Time: 12/25/15  2:32 PM  Result Value Ref Range Status   Specimen Description BLOOD  L FA  Final   Special Requests   Final    BOTTLES DRAWN AEROBIC AND ANAEROBIC  AER 12 ML ANA 12 ML   Culture  Setup Time   Final    GRAM NEGATIVE RODS IN BOTH AEROBIC AND ANAEROBIC BOTTLES CRITICAL RESULT CALLED TO, READ BACK BY AND VERIFIED WITH: NATE COOKSON AT 0535 12/26/15 SDR Performed at [facility]    Culture ENTEROBACTER CLOACAE (A)  Final   Report Status 12/30/2015 FINAL  Final   Organism ID, Bacteria ENTEROBACTER CLOACAE  Final      Susceptibility   Enterobacter cloacae - MIC*    CEFAZOLIN >=64 RESISTANT Resistant     CEFEPIME <=1  SENSITIVE Sensitive     CEFTAZIDIME 16 INTERMEDIATE Intermediate     CEFTRIAXONE >=64 RESISTANT Resistant     CIPROFLOXACIN >=4 RESISTANT Resistant     GENTAMICIN <=1 SENSITIVE Sensitive     IMIPENEM <=0.25 SENSITIVE Sensitive     TRIMETH/SULFA <=20 SENSITIVE Sensitive     PIP/TAZO >=128 RESISTANT Resistant     * ENTEROBACTER CLOACAE  Blood Culture ID Panel (Reflexed)     Status: Abnormal   Collection Time: 12/25/15  2:32 PM  Result Value Ref Range Status   Enterococcus species NOT DETECTED NOT DETECTED Final   Listeria monocytogenes NOT DETECTED NOT DETECTED Final   Staphylococcus species NOT DETECTED NOT DETECTED Final   Staphylococcus aureus NOT DETECTED NOT DETECTED Final   Streptococcus species NOT DETECTED NOT DETECTED Final   Streptococcus agalactiae NOT DETECTED NOT DETECTED Final   Streptococcus pneumoniae NOT DETECTED NOT DETECTED Final   Streptococcus pyogenes NOT DETECTED NOT DETECTED Final   Acinetobacter baumannii NOT DETECTED NOT DETECTED Final   Enterobacteriaceae species DETECTED (A) NOT DETECTED Final    Comment: CRITICAL RESULT CALLED TO, READ BACK BY AND VERIFIED WITH:  NATE COOKSON AT 0535 12/26/15 SDR    Enterobacter cloacae complex DETECTED (A) NOT DETECTED Corrected    Comment: CRITICAL RESULT CALLED TO, READ BACK BY AND VERIFIED WITH: K MERRILL 12/28/15 @ 1210 M VESTAL CORRECTED ON 09/28 AT 1211: PREVIOUSLY REPORTED AS NOT DETECTED    Escherichia coli NOT DETECTED NOT DETECTED Corrected    Comment: PREVIOUSLY REPORTED AS: DETECTED CORRECTED RESULTS CALLED TO: K MERRILL 12/28/15 @ 1210 M VESTAL Performed at [facility] CORRECTED ON 09/28 AT 1211: PREVIOUSLY REPORTED AS DETECTED CRITICAL RESULT CALLED TO, READ BACK BY AND VERIFIED WITH:  NATE COOKSON AT 0535 12/26/15 SDR    Klebsiella oxytoca NOT DETECTED NOT DETECTED Final   Klebsiella pneumoniae NOT DETECTED NOT DETECTED Final   Proteus species NOT DETECTED NOT DETECTED Final   Serratia marcescens  NOT DETECTED NOT DETECTED Final   Carbapenem resistance NOT DETECTED NOT DETECTED Final   Haemophilus influenzae NOT DETECTED NOT DETECTED Final   Neisseria meningitidis NOT DETECTED NOT DETECTED Final   Pseudomonas aeruginosa NOT DETECTED NOT DETECTED Final   Candida albicans NOT DETECTED NOT DETECTED Final   Candida glabrata NOT DETECTED NOT DETECTED Final   Candida krusei NOT DETECTED NOT DETECTED Final   Candida parapsilosis NOT DETECTED NOT DETECTED Final  Candida tropicalis NOT DETECTED NOT DETECTED Final  Culture, body fluid-bottle     Status: None   Collection Time: 12/25/15  4:00 PM  Result Value Ref Range Status   Specimen Description FLUID PERITONEAL DIALYSATE  Final   Special Requests BOTTLES DRAWN AEROBIC AND ANAEROBIC 10CC  Final   Culture   Final    NO GROWTH 5 DAYS Performed at [facility]    Report Status 12/30/2015 FINAL  Final  Gram stain     Status: None   Collection Time: 12/25/15  4:00 PM  Result Value Ref Range Status   Specimen Description FLUID PERITONEAL DIALYSATE  Final   Special Requests NONE  Final   Gram Stain   Final    FEW WBC PRESENT,BOTH PMN AND MONONUCLEAR NO ORGANISMS SEEN Performed at [facility]    Report Status 12/27/2015 FINAL  Final  MRSA PCR Screening     Status: Abnormal   Collection Time: 12/26/15  1:34 PM  Result Value Ref Range Status   MRSA by PCR POSITIVE (A) NEGATIVE Final    Comment:        The GeneXpert MRSA Assay (FDA approved for NASAL specimens only), is one component of a comprehensive MRSA colonization surveillance program. It is not intended to diagnose MRSA infection nor to guide or monitor treatment for MRSA infections. RESULT CALLED TO, READ BACK BY AND VERIFIED WITH: AMY TODD RN AT 1455 12/26/15 MSS.   Culture, body fluid-bottle     Status: None (Preliminary result)   Collection Time: 12/27/15  4:15 PM  Result Value Ref Range Status   Specimen Description PERITONEAL DIALYSATE  Final    Special Requests NONE  Final   Culture   Final    NO GROWTH 4 DAYS Performed at [facility]    Report Status PENDING  Incomplete  Gram stain     Status: None   Collection Time: 12/27/15  4:15 PM  Result Value Ref Range Status   Specimen Description PERITONEAL DIALYSATE  Final   Special Requests NONE  Final   Gram Stain   Final    WBC PRESENT,BOTH PMN AND MONONUCLEAR NO ORGANISMS SEEN CYTOSPIN Performed at [facility]    Report Status 12/27/2015 FINAL  Final  CULTURE, BLOOD (ROUTINE X 2) w Reflex to ID Panel     Status: None (Preliminary result)   Collection Time: 12/28/15  7:19 PM  Result Value Ref Range Status   Specimen Description BLOOD RIGHT AC  Final   Special Requests BOTTLES DRAWN AEROBIC AND ANAEROBIC 10 ML  Final   Culture NO GROWTH 4 DAYS  Final   Report Status PENDING  Incomplete    Coagulation Studies: No results for input(s): LABPROT, INR in the last 72 hours.  Urinalysis: No results for input(s): COLORURINE, LABSPEC, PHURINE, GLUCOSEU, HGBUR, BILIRUBINUR, KETONESUR, PROTEINUR, UROBILINOGEN, NITRITE, LEUKOCYTESUR in the last 72 hours.  Invalid input(s): APPERANCEUR    Imaging: Ct Head Wo Contrast  Result Date: 12/30/2015 CLINICAL DATA:  Altered mental status EXAM: CT HEAD WITHOUT CONTRAST TECHNIQUE: Contiguous axial images were obtained from the base of the skull through the vertex without intravenous contrast. COMPARISON:  07/21/2015 FINDINGS: Brain: Mild global atrophy. Chronic ischemic changes in the periventricular white matter. No mass effect, midline shift, or acute hemorrhage. Vascular: Carotid siphon calcifications Skull: Intact Sinuses/Orbits: Mucosal thickening in the left maxillary sinus. Other: Noncontributory IMPRESSION: No acute intracranial pathology.  Chronic changes. Electronically Signed   By: Dahlia ClientArthur  Hoss M.D.  On: 12/30/2015 14:31     Medications:     . calcium acetate  2,001 mg Oral TID WC  . calcium carbonate  1 tablet  Oral TID WC  . clopidogrel  75 mg Oral Daily  . colesevelam  625 mg Oral BID WC  . dialysis solution 2.5% low-MG/low-CA   Intraperitoneal Q24H  . docusate sodium  100 mg Oral QHS  . epoetin (EPOGEN/PROCRIT) injection  20,000 Units Subcutaneous Weekly  . feeding supplement (PRO-STAT SUGAR FREE 64)  30 mL Oral TID WC  . gentamicin cream  1 application Topical Daily  . heparin subcutaneous  5,000 Units Subcutaneous Q8H  . hydrALAZINE  25 mg Oral Daily  . insulin aspart  0-10 Units Subcutaneous TID WC  . insulin glargine  8 Units Subcutaneous QHS  . isosorbide mononitrate  60 mg Oral Daily  . lactulose  10 g Oral Daily  . levETIRAcetam  500 mg Oral q morning - 10a  . lisinopril  40 mg Oral Daily  . meropenem (MERREM) IV  1 g Intravenous Q24H  . metaxalone  800 mg Oral BID  . metoprolol  50 mg Oral BID  . mupirocin ointment  1 application Nasal BID  . nicotine  14 mg Transdermal Daily  . nystatin  1 g Topical BID  . pantoprazole  40 mg Oral Daily  . polyethylene glycol  17 g Oral Daily  . ranolazine  500 mg Oral BID  . senna  2 tablet Oral BID  . sodium chloride flush  3 mL Intravenous Q12H  . torsemide  100 mg Oral Daily  . white petrolatum   Topical Q72H   acetaminophen **OR** acetaminophen, bisacodyl, metolazone, metoprolol, morphine injection, nitroGLYCERIN, ondansetron **OR** ondansetron (ZOFRAN) IV, oxyCODONE-acetaminophen  Assessment/ Plan:  50 y.o. white female with end-stage renal disease on hemodialysis, asthma, gastroparesis, diabetes mellitus type II, congestive heart failure, hypertension, endometriosis, coronary atherosclerosis  [facility]/ [address]/ TTS   1. End-stage renal disease on hemodialysis dialysis: currently getting Peritoneal dialysis. Tolerated treatment last night.  - Hemodialysis access before discharge. Removed due to bacteremia.  - Consult vascular surgery for permcath placement.   2. Anemia of CKD: hemoglobin 7.5. Getting Epogen  20,000 units subcutaneous weekly.  3. Sepsis with bactermia: 9/25 blood cultures positive for enterobacter. Tunneled catheter removed. Aztreonam given on 9/25. Levofloxacin ordered from 9/26-10/1, Meropenem ordered from 9/30  4. Secondary Hyperparathyroidism:  Patient taken off of calcitriol as PTH was low. Calcium high at 10.2  - calcium acetate 3 tablets by mouth 3 times a day with meals: reduce to 2 tabs with meals.    LOS: 5 Kimberly Montgomery 10/2/201710:04 AM

## 2016-01-01 NOTE — Progress Notes (Addendum)
Pharmacy Antibiotic Note  Kimberly Montgomery is a 50 y.o. female admitted on 12/25/2015 with bacteremia.  Pharmacy has been consulted for meropenem dosing.  Plan: Patient initiated on meropenem on 9/30. Will plan meropenem 1000mg  IV Q24hr and transition to gentamicin with dialysis when dialysis catheter is placed. Patient is currently receiving PD.    Per ID note, will need 14 days of abx for this bacteremia  Height: 5\' 10"  (177.8 cm) Weight: 269 lb 6.4 oz (122.2 kg) (During peritoneal dialysis) IBW/kg (Calculated) : 68.5  Temp (24hrs), Avg:97.7 F (36.5 C), Min:97.4 F (36.3 C), Max:98.4 F (36.9 C)   Recent Labs Lab 12/26/15 0431 12/27/15 1347 12/29/15 0425  WBC 11.0 6.3 6.0  CREATININE 6.05*  --  5.90*    Estimated Creatinine Clearance: 15.8 mL/min (by C-G formula based on SCr of 5.9 mg/dL (H)).    Allergies  Allergen Reactions  . Cephalosporins Anaphylaxis  . Penicillins Anaphylaxis and Other (See Comments)    Has patient had a PCN reaction causing immediate rash, facial/tongue/throat swelling, SOB or lightheadedness with hypotension: Yes Has patient had a PCN reaction causing severe rash involving mucus membranes or skin necrosis: No Has patient had a PCN reaction that required hospitalization No Has patient had a PCN reaction occurring within the last 10 years: No If all of the above answers are "NO", then may proceed with Cephalosporin use.  . Lamictal [Lamotrigine] Other (See Comments)    Reaction:  Hallucinations  . Phenergan [Promethazine Hcl] Nausea And Vomiting  . Pravastatin Other (See Comments)    Reaction:  Muscle pain   . Sulfa Antibiotics Other (See Comments)    Reaction:  Unknown     Antimicrobials this admission: Aztreonam 9/25 >> 9/25 Vancomycin  9/25 >> 9/25   Levofloxacin 9/26 >> 9/30  Meropenem 9/30 >>    Microbiology results: 9/26 MRSA PCR: Positive 9/25 BCx: Enterobacter Cloacae, S - Cefepime, Gentamicin, Imipenem, Septra   Pharmacy will  continue to monitor and adjust per consult.   Horris Latino, PharmD Pharmacy Resident 01/01/2016 3:21 PM

## 2016-01-01 NOTE — Progress Notes (Signed)
  SUBJECTIVE: [name] is alert and oriented this morning and conversant. She denies chest pain or shortness of breath.   Vitals:   12/31/15 1225 12/31/15 1945 01/01/16 0436 01/01/16 0507  BP: (!) 154/56 (!) 150/54 (!) 144/52   Pulse: 92 73 68   Resp: 20 18    Temp: 98.4 F (36.9 C) 98.4 F (36.9 C) 97.7 F (36.5 C)   TempSrc: Oral Oral Oral   SpO2: 97% 98% 100%   Weight:    269 lb 6.4 oz (122.2 kg)  Height:        Intake/Output Summary (Last 24 hours) at 01/01/16 0832 Last data filed at 12/31/15 0955  Gross per 24 hour  Intake                0 ml  Output              961 ml  Net             -961 ml    LABS: Basic Metabolic Panel: No results for input(s): NA, K, CL, CO2, GLUCOSE, BUN, CREATININE, CALCIUM, MG, PHOS in the last 72 hours. Liver Function Tests: No results for input(s): AST, ALT, ALKPHOS, BILITOT, PROT, ALBUMIN in the last 72 hours. No results for input(s): LIPASE, AMYLASE in the last 72 hours. CBC: No results for input(s): WBC, NEUTROABS, HGB, HCT, MCV, PLT in the last 72 hours. Cardiac Enzymes: No results for input(s): CKTOTAL, CKMB, CKMBINDEX, TROPONINI in the last 72 hours. BNP: Invalid input(s): POCBNP D-Dimer: No results for input(s): DDIMER in the last 72 hours. Hemoglobin A1C: No results for input(s): HGBA1C in the last 72 hours. Fasting Lipid Panel: No results for input(s): CHOL, HDL, LDLCALC, TRIG, CHOLHDL, LDLDIRECT in the last 72 hours. Thyroid Function Tests: No results for input(s): TSH, T4TOTAL, T3FREE, THYROIDAB in the last 72 hours.  Invalid input(s): FREET3 Anemia Panel: No results for input(s): VITAMINB12, FOLATE, FERRITIN, TIBC, IRON, RETICCTPCT in the last 72 hours.   PHYSICAL EXAM General: Well developed, well nourished, in no acute distress HEENT:  Normocephalic and atramatic Neck:  No JVD.  Lungs: Clear bilaterally to auscultation and percussion. Heart: HRRR . Normal S1 and S2 without gallops or murmurs.  Abdomen: Bowel  sounds are positive, abdomen soft and non-tender  Msk:  Right lower extremity amputation Extremities: No clubbing, cyanosis or edema.   Neuro: Alert and oriented X 3. Psych:  Good affect, responds appropriately  TELEMETRY: Normal sinus rhythm with rates in the 60s while asleep in the 70s to 90s while awake.  ASSESSMENT AND PLAN: Mildly elevated troponin with chest pain on admission and history of coronary artery disease. She is status post cath with PCI and stenting a month ago showing no significant restenosis. Patient was found to have Escherichia coli sepsis related to her dialysis catheter which has been subsequently removed. Elevated troponins were likely related to bacteremia and the patient is currently being treated for this. She has no further chest pain. We'll continue to monitor and follow up as an outpatient.  Active Problems:   Chest pain   Bacteremia    Berton Bon, NP 01/01/2016 8:32 AM

## 2016-01-01 NOTE — Op Note (Signed)
OPERATIVE NOTE    PRE-OPERATIVE DIAGNOSIS: 1. ESRD   POST-OPERATIVE DIAGNOSIS: same as above  PROCEDURE: 1. Ultrasound guidance for vascular access to the left internal jugular vein 2. Fluoroscopic guidance for placement of catheter 3. Placement of a 23 cm tip to cuff tunneled hemodialysis catheter via the left internal jugular vein  SURGEON: Festus Barren, MD  ANESTHESIA:  Local with Moderate conscious sedation for approximately 15 minutes using 2 mg of Versed and 50 mcg of Fentanyl  ESTIMATED BLOOD LOSS: 15 cc  FLUORO TIME: less than one minute  CONTRAST: none  FINDING(S): 1.  Patent left internal jugular vein  SPECIMEN(S):  None  INDICATIONS:   Kimberly Montgomery is a 50 y.o. female who presents with renal failure and infection of her previous PermCath that had to be removed. She is now ready for a new PermCath.  The patient needs long term dialysis access for their ESRD, and a Permcath is necessary.  Risks and benefits are discussed and informed consent is obtained.    DESCRIPTION: After obtaining full informed written consent, the patient was brought back to the vascular suited. The patient's left neck and chest were sterilely prepped and draped in a sterile surgical field was created. Moderate conscious sedation was administered during a face to face encounter with the patient throughout the procedure with my supervision of the RN administering medicines and monitoring the patient's vital signs, pulse oximetry, telemetry and mental status throughout from the start of the procedure until the patient was taken to the recovery room.  The left internal jugular vein was visualized with ultrasound and found to be patent. It was then accessed under direct ultrasound guidance with a micropuncture needle and a micropuncture wire and sheath were then placed and a permanent image was recorded. A J wire was placed and the micropuncture sheath was removed. After skin nick and dilatation, the  peel-away sheath was placed over the wire. I then turned my attention to an area under the clavicle. Approximately 1-2 fingerbreadths below the clavicle a small counterincision was created and tunneled from the subclavicular incision to the access site. Using fluoroscopic guidance, a 23 centimeter tip to cuff tunneled hemodialysis catheter was selected, and tunneled from the subclavicular incision to the access site. It was then placed through the peel-away sheath and the peel-away sheath was removed. Using fluoroscopic guidance the catheter tips were parked in the right atrium. The appropriate distal connectors were placed. It withdrew blood well and flushed easily with heparinized saline and a concentrated heparin solution was then placed. It was secured to the chest wall with 2 Prolene sutures. The access incision was closed single 4-0 Monocryl. A 4-0 Monocryl pursestring suture was placed around the exit site. Sterile dressings were placed. The patient tolerated the procedure well and was taken to the recovery room in stable condition.  COMPLICATIONS: None  CONDITION: Stable  Festus Barren  01/01/2016, 4:07 PM

## 2016-01-01 NOTE — Care Management (Signed)
Barrier- peritoneal dialysis due to removal of permcath.  SNF most likely will not be able to perform peritoneal dialysis.

## 2016-01-02 ENCOUNTER — Inpatient Hospital Stay: Payer: Medicare HMO

## 2016-01-02 ENCOUNTER — Encounter: Payer: Self-pay | Admitting: Vascular Surgery

## 2016-01-02 LAB — RENAL FUNCTION PANEL
ALBUMIN: 1.8 g/dL — AB (ref 3.5–5.0)
Anion gap: 6 (ref 5–15)
BUN: 71 mg/dL — AB (ref 6–20)
CALCIUM: 10.9 mg/dL — AB (ref 8.9–10.3)
CO2: 32 mmol/L (ref 22–32)
CREATININE: 6.94 mg/dL — AB (ref 0.44–1.00)
Chloride: 92 mmol/L — ABNORMAL LOW (ref 101–111)
GFR, EST AFRICAN AMERICAN: 7 mL/min — AB (ref >60)
GFR, EST NON AFRICAN AMERICAN: 6 mL/min — AB (ref >60)
Glucose, Bld: 143 mg/dL — ABNORMAL HIGH (ref 65–99)
PHOSPHORUS: 4.4 mg/dL (ref 2.5–4.6)
Potassium: 4.6 mmol/L (ref 3.5–5.1)
SODIUM: 130 mmol/L — AB (ref 135–145)

## 2016-01-02 LAB — CBC
HCT: 21.9 % — ABNORMAL LOW (ref 35.0–47.0)
HCT: 19.1 % — ABNORMAL LOW (ref 35.0–47.0)
Hemoglobin: 7.5 g/dL — ABNORMAL LOW (ref 12.0–16.0)
Hemoglobin: 6.5 g/dL — ABNORMAL LOW (ref 12.0–16.0)
MCH: 30.9 pg (ref 26.0–34.0)
MCH: 30.5 pg (ref 26.0–34.0)
MCHC: 34 g/dL (ref 32.0–36.0)
MCHC: 34 g/dL (ref 32.0–36.0)
MCV: 90.8 fL (ref 80.0–100.0)
MCV: 89.8 fL (ref 80.0–100.0)
PLATELETS: 407 10*3/uL (ref 150–440)
PLATELETS: 419 10*3/uL (ref 150–440)
RBC: 2.42 MIL/uL — AB (ref 3.80–5.20)
RBC: 2.13 MIL/uL — AB (ref 3.80–5.20)
RDW: 14.2 % (ref 11.5–14.5)
RDW: 14.3 % (ref 11.5–14.5)
WBC: 9.2 10*3/uL (ref 3.6–11.0)
WBC: 8.4 10*3/uL (ref 3.6–11.0)

## 2016-01-02 LAB — GLUCOSE, CAPILLARY
GLUCOSE-CAPILLARY: 135 mg/dL — AB (ref 65–99)
GLUCOSE-CAPILLARY: 125 mg/dL — AB (ref 65–99)
GLUCOSE-CAPILLARY: 166 mg/dL — AB (ref 65–99)
GLUCOSE-CAPILLARY: 168 mg/dL — AB (ref 65–99)

## 2016-01-02 LAB — PREPARE RBC (CROSSMATCH)

## 2016-01-02 LAB — CULTURE, BLOOD (ROUTINE X 2): Culture: NO GROWTH

## 2016-01-02 MED ORDER — SODIUM CHLORIDE 0.9 % IV SOLN: Freq: Once | INTRAVENOUS | Status: DC

## 2016-01-02 MED ORDER — LACTULOSE 10 GM/15ML PO SOLN: 20.0000 g | Freq: Two times a day (BID) | ORAL | Status: DC | PRN

## 2016-01-02 NOTE — Progress Notes (Signed)
This note also relates to the following rows which could not be included: Pulse Rate - Cannot attach notes to unvalidated device data SpO2 - Cannot attach notes to unvalidated device data  Pre hd info  

## 2016-01-02 NOTE — Care Management (Signed)
Permcath inserted 10.2.2017.  Discussed during progression that patient will need to sit for dialysis.

## 2016-01-02 NOTE — Progress Notes (Signed)
[facility] CLINIC INFECTIOUS DISEASE PROGRESS NOTE Date of Admission:  12/25/2015     ID: Kimberly Montgomery is a 50 y.o. female with enterobacter bacteremia Active Problems:   Chest pain   Bacteremia   Subjective: Much improved clinically more alert with HD Had HD cath placed L chest wall  ROS  Eleven systems are reviewed and negative except per hpi  Medications:  Antibiotics Given (last 72 hours)    Date/Time Action Medication Dose Rate   12/30/15 1543 Given   meropenem (MERREM) 500 mg in sodium chloride 0.9 % 50 mL IVPB 500 mg 100 mL/hr   12/31/15 0524 Given  [PC]   meropenem (MERREM) 500 mg in sodium chloride 0.9 % 50 mL IVPB 500 mg 100 mL/hr   01/01/16 1744 Given   meropenem (MERREM) 1 g in sodium chloride 0.9 % 100 mL IVPB 1 g 200 mL/hr     . sodium chloride   Intravenous Once  . calcium acetate  1,334 mg Oral TID WC  . calcium carbonate  1 tablet Oral TID WC  . clopidogrel  75 mg Oral Daily  . colesevelam  625 mg Oral BID WC  . dialysis solution 2.5% low-MG/low-CA   Intraperitoneal Q24H  . docusate sodium  100 mg Oral QHS  . epoetin (EPOGEN/PROCRIT) injection  20,000 Units Subcutaneous Weekly  . feeding supplement (PRO-STAT SUGAR FREE 64)  30 mL Oral TID WC  . gentamicin cream  1 application Topical Daily  . heparin subcutaneous  5,000 Units Subcutaneous Q8H  . hydrALAZINE  25 mg Oral Daily  . insulin aspart  0-10 Units Subcutaneous TID WC  . insulin glargine  8 Units Subcutaneous QHS  . isosorbide mononitrate  60 mg Oral Daily  . lactulose  10 g Oral Daily  . levETIRAcetam  500 mg Oral q morning - 10a  . lisinopril  40 mg Oral Daily  . meropenem (MERREM) IV  1 g Intravenous Q24H  . metaxalone  800 mg Oral BID  . metoprolol  50 mg Oral BID  . mupirocin ointment  1 application Nasal BID  . nicotine  14 mg Transdermal Daily  . nystatin  1 g Topical BID  . pantoprazole  40 mg Oral Daily  . polyethylene glycol  17 g Oral Daily  . ranolazine  500 mg Oral BID  .  senna  2 tablet Oral BID  . sodium chloride flush  3 mL Intravenous Q12H  . torsemide  100 mg Oral Daily  . white petrolatum   Topical Q72H    Objective: Vital signs in last 24 hours: Temp:  [98.1 F (36.7 C)-98.9 F (37.2 C)] 98.5 F (36.9 C) (10/03 1433) Pulse Rate:  [59-81] 59 (10/03 1433) Resp:  [13-19] 18 (10/03 1355) BP: (126-180)/(48-81) 138/56 (10/03 1433) SpO2:  [97 %-100 %] 100 % (10/03 1433) FiO2 (%):  [100 %] 100 % (10/03 1230) Weight:  [116.2 kg (256 lb 2.8 oz)-118.4 kg (261 lb 1.6 oz)] 116.2 kg (256 lb 2.8 oz) (10/03 1431) Constitutional:  Morbidly obese, alert  HENT: [place]/AT, PERRLA, no scleral icterus Mouth/Throat: Oropharynx is clear and dry . No oropharyngeal exudate.  Cardiovascular: Normal rate, regular rhythm 2/6 sm Pulmonary/Chest: Effort normal and breath sounds normal. No respiratory distress.  has no wheezes.  Neck = supple, no nuchal rigidity Abdominal: Soft. Distended, obese, mild difusse ttp. PD cath RLQ wnl Lymphadenopathy: no cervical adenopathy. No axillary adenopath Neurological: lethargic Skin: Skin is warm and dry. No rash noted. No erythema.  Psychiatric: flat affect L chest wall HD cath wnl   Lab Results  Recent Labs  01/02/16 0506 01/02/16 1041  WBC 9.2 8.4  HGB 7.5* 6.5*  HCT 21.9* 19.1*  NA  --  130*  K  --  4.6  CL  --  92*  CO2  --  32  BUN  --  71*  CREATININE  --  6.94*    Microbiology: Results for orders placed or performed during the hospital encounter of 12/25/15  Blood culture (routine x 2)     Status: Abnormal   Collection Time: 12/25/15  2:32 PM  Result Value Ref Range Status   Specimen Description BLOOD  L HAND  Final   Special Requests   Final    BOTTLES DRAWN AEROBIC AND ANAEROBIC  AER 12 ML ANA 14 ML   Culture  Setup Time   Final    GRAM NEGATIVE RODS IN BOTH AEROBIC AND ANAEROBIC BOTTLES CRITICAL VALUE NOTED.  VALUE IS CONSISTENT WITH PREVIOUSLY REPORTED AND CALLED VALUE. SDR    Culture (A)  Final     ENTEROBACTER CLOACAE SUSCEPTIBILITIES PERFORMED ON PREVIOUS CULTURE WITHIN THE LAST 5 DAYS. Performed at [facility]    Report Status 12/30/2015 FINAL  Final  Blood culture (routine x 2)     Status: Abnormal   Collection Time: 12/25/15  2:32 PM  Result Value Ref Range Status   Specimen Description BLOOD  L FA  Final   Special Requests   Final    BOTTLES DRAWN AEROBIC AND ANAEROBIC  AER 12 ML ANA 12 ML   Culture  Setup Time   Final    GRAM NEGATIVE RODS IN BOTH AEROBIC AND ANAEROBIC BOTTLES CRITICAL RESULT CALLED TO, READ BACK BY AND VERIFIED WITH: NATE COOKSON AT 0535 12/26/15 SDR Performed at [facility]    Culture ENTEROBACTER CLOACAE (A)  Final   Report Status 12/30/2015 FINAL  Final   Organism ID, Bacteria ENTEROBACTER CLOACAE  Final      Susceptibility   Enterobacter cloacae - MIC*    CEFAZOLIN >=64 RESISTANT Resistant     CEFEPIME <=1 SENSITIVE Sensitive     CEFTAZIDIME 16 INTERMEDIATE Intermediate     CEFTRIAXONE >=64 RESISTANT Resistant     CIPROFLOXACIN >=4 RESISTANT Resistant     GENTAMICIN <=1 SENSITIVE Sensitive     IMIPENEM <=0.25 SENSITIVE Sensitive     TRIMETH/SULFA <=20 SENSITIVE Sensitive     PIP/TAZO >=128 RESISTANT Resistant     * ENTEROBACTER CLOACAE  Blood Culture ID Panel (Reflexed)     Status: Abnormal   Collection Time: 12/25/15  2:32 PM  Result Value Ref Range Status   Enterococcus species NOT DETECTED NOT DETECTED Final   Listeria monocytogenes NOT DETECTED NOT DETECTED Final   Staphylococcus species NOT DETECTED NOT DETECTED Final   Staphylococcus aureus NOT DETECTED NOT DETECTED Final   Streptococcus species NOT DETECTED NOT DETECTED Final   Streptococcus agalactiae NOT DETECTED NOT DETECTED Final   Streptococcus pneumoniae NOT DETECTED NOT DETECTED Final   Streptococcus pyogenes NOT DETECTED NOT DETECTED Final   Acinetobacter baumannii NOT DETECTED NOT DETECTED Final   Enterobacteriaceae species DETECTED (A) NOT DETECTED Final     Comment: CRITICAL RESULT CALLED TO, READ BACK BY AND VERIFIED WITH:  NATE COOKSON AT 0535 12/26/15 SDR    Enterobacter cloacae complex DETECTED (A) NOT DETECTED Corrected    Comment: CRITICAL RESULT CALLED TO, READ BACK BY AND VERIFIED WITH: K MERRILL 12/28/15 @ 1210 M  VESTAL CORRECTED ON 09/28 AT 1211: PREVIOUSLY REPORTED AS NOT DETECTED    Escherichia coli NOT DETECTED NOT DETECTED Corrected    Comment: PREVIOUSLY REPORTED AS: DETECTED CORRECTED RESULTS CALLED TO: K MERRILL 12/28/15 @ 1210 M VESTAL Performed at [facility] CORRECTED ON 09/28 AT 1211: PREVIOUSLY REPORTED AS DETECTED CRITICAL RESULT CALLED TO, READ BACK BY AND VERIFIED WITH:  NATE COOKSON AT 0535 12/26/15 SDR    Klebsiella oxytoca NOT DETECTED NOT DETECTED Final   Klebsiella pneumoniae NOT DETECTED NOT DETECTED Final   Proteus species NOT DETECTED NOT DETECTED Final   Serratia marcescens NOT DETECTED NOT DETECTED Final   Carbapenem resistance NOT DETECTED NOT DETECTED Final   Haemophilus influenzae NOT DETECTED NOT DETECTED Final   Neisseria meningitidis NOT DETECTED NOT DETECTED Final   Pseudomonas aeruginosa NOT DETECTED NOT DETECTED Final   Candida albicans NOT DETECTED NOT DETECTED Final   Candida glabrata NOT DETECTED NOT DETECTED Final   Candida krusei NOT DETECTED NOT DETECTED Final   Candida parapsilosis NOT DETECTED NOT DETECTED Final   Candida tropicalis NOT DETECTED NOT DETECTED Final  Culture, body fluid-bottle     Status: None   Collection Time: 12/25/15  4:00 PM  Result Value Ref Range Status   Specimen Description FLUID PERITONEAL DIALYSATE  Final   Special Requests BOTTLES DRAWN AEROBIC AND ANAEROBIC 10CC  Final   Culture   Final    NO GROWTH 5 DAYS Performed at [facility]    Report Status 12/30/2015 FINAL  Final  Gram stain     Status: None   Collection Time: 12/25/15  4:00 PM  Result Value Ref Range Status   Specimen Description FLUID PERITONEAL DIALYSATE  Final   Special  Requests NONE  Final   Gram Stain   Final    FEW WBC PRESENT,BOTH PMN AND MONONUCLEAR NO ORGANISMS SEEN Performed at [facility]    Report Status 12/27/2015 FINAL  Final  MRSA PCR Screening     Status: Abnormal   Collection Time: 12/26/15  1:34 PM  Result Value Ref Range Status   MRSA by PCR POSITIVE (A) NEGATIVE Final    Comment:        The GeneXpert MRSA Assay (FDA approved for NASAL specimens only), is one component of a comprehensive MRSA colonization surveillance program. It is not intended to diagnose MRSA infection nor to guide or monitor treatment for MRSA infections. RESULT CALLED TO, READ BACK BY AND VERIFIED WITH: AMY TODD RN AT 1455 12/26/15 MSS.   Culture, body fluid-bottle     Status: None   Collection Time: 12/27/15  4:15 PM  Result Value Ref Range Status   Specimen Description PERITONEAL DIALYSATE  Final   Special Requests NONE  Final   Culture   Final    NO GROWTH 5 DAYS Performed at [facility]    Report Status 01/01/2016 FINAL  Final  Gram stain     Status: None   Collection Time: 12/27/15  4:15 PM  Result Value Ref Range Status   Specimen Description PERITONEAL DIALYSATE  Final   Special Requests NONE  Final   Gram Stain   Final    WBC PRESENT,BOTH PMN AND MONONUCLEAR NO ORGANISMS SEEN CYTOSPIN Performed at [facility]    Report Status 12/27/2015 FINAL  Final  CULTURE, BLOOD (ROUTINE X 2) w Reflex to ID Panel     Status: None   Collection Time: 12/28/15  7:19 PM  Result Value Ref Range Status  Specimen Description BLOOD RIGHT AC  Final   Special Requests BOTTLES DRAWN AEROBIC AND ANAEROBIC 10 ML  Final   Culture NO GROWTH 5 DAYS  Final   Report Status 01/02/2016 FINAL  Final    Studies/Results: Dg Chest 1 View  Result Date: 01/02/2016 CLINICAL DATA:  Status post dialysis catheter placement today. EXAM: CHEST 1 VIEW COMPARISON:  Single-view of the chest 12/25/2015. FINDINGS: New left IJ approach dialysis catheter is  in place with the tip projecting at the superior cavoatrial junction. No pneumothorax. Right IJ dialysis catheter present on the prior exam has been removed. Lungs are clear. Heart size is normal. No pleural fluid. IMPRESSION: Tip of new dialysis catheter projects at the superior cavoatrial junction. Negative for pneumothorax. Electronically Signed   By: Drusilla Kannerhomas  Dalessio M.D.   On: 01/02/2016 10:27    Assessment/Plan: Kimberly Montgomery is a 50 y.o. female with ESRD on HD but with a PD cath and Permacath in place admitted with CP and found to have Enterobacter bacteremia.  Fluid from PD did not appear infected and cx neg. Permacath has been removed. PD cath does not appear infected. FU BCX negative. HD cath replaced  Allergic to PCN (Anaphylaxis), cephalosporins and bactrim Initially on levo but changed to meropenem based on sensitivities. She has clinically markedly improved despite initially receiving levofloxacin which the organism is likely resistant to. She has received 4 days of meropenem   Recommendations Since she has clinically improved would rec just one more day of IV meropenem then can be dced off abx and followed for recurrent fevers, or worsening clinical status. Thank you very much for the consult.  If infection recurs will need a CT abd pelvis Will follow with you.  Anaiah Mcmannis P   01/02/2016, 3:29 PM

## 2016-01-02 NOTE — Progress Notes (Signed)
PT Cancellation Note  Patient Details Name: BUNNY HORT MRN: [id number] DOB: 1965/12/02   Cancelled Treatment:    Reason Eval/Treat Not Completed: Medical issues which prohibited therapy Pt has returned from dialysis, his Hgb has dropped to 6.5 - will hold PT today until numbers are stable and acceptable.    Malachi Pro, DPT  01/02/2016, 3:19 PM

## 2016-01-02 NOTE — Progress Notes (Signed)
[facility]  ROUNDING NOTE   Subjective:   Seen and examined on hemodialysis. LIJ permcath placed yesterday by Dr. Wyn Quaker. PD held.  PRBC transfusion ordered.     HEMODIALYSIS FLOWSHEET:  Blood Flow Rate (mL/min): 400 mL/min Arterial Pressure (mmHg): -120 mmHg Venous Pressure (mmHg): 130 mmHg Transmembrane Pressure (mmHg): 50 mmHg Ultrafiltration Rate (mL/min): 670 mL/min Dialysate Flow Rate (mL/min): 600 ml/min Conductivity: Machine : 13.8 Conductivity: Machine : 13.8 Dialysis Fluid Bolus: Normal Saline Bolus Amount (mL): 250 mL (prime) Dialysate Change:  (3k) Intra-Hemodialysis Comments: 357. pt resting, no c/o, vss.      Objective:  Vital signs in last 24 hours:  Temp:  [97.4 F (36.3 C)-98.9 F (37.2 C)] 98.2 F (36.8 C) (10/03 1038) Pulse Rate:  [61-81] 61 (10/03 1115) Resp:  [16-20] 18 (10/03 1115) BP: (147-184)/(49-81) 147/59 (10/03 1115) SpO2:  [97 %-100 %] 100 % (10/03 1115) Weight:  [117.4 kg (258 lb 13.1 oz)-118.4 kg (261 lb 1.6 oz)] 117.4 kg (258 lb 13.1 oz) (10/03 1038)  Weight change: -3.766 kg (-8 lb 4.8 oz) Filed Weights   01/01/16 0507 01/02/16 0419 01/02/16 1038  Weight: 122.2 kg (269 lb 6.4 oz) 118.4 kg (261 lb 1.6 oz) 117.4 kg (258 lb 13.1 oz)    Intake/Output: I/O last 3 completed shifts: In: -  Out: 1071 [Urine:25; Other:1046]   Intake/Output this shift:  No intake/output data recorded.  Physical Exam: General: Sitting up in bed  Head: Normocephalic, atraumatic. Moist oral mucosal membranes  Eyes: Anicteric  Neck: Supple, trachea midline  Lungs:  Clear to auscultation, normal effort  Heart: S1S2 no rubs  Abdomen:  Soft, nontender, BS present, PD catheter in place  Extremities: RLE BKA, bilateral 1+ lower extremity edema  Neurologic: Nonfocal, moving all four extremities  Skin: No lesions  Access: PD cath in place, LIJ permcath Dr. Wyn Quaker 10/2    Basic Metabolic Panel:  Recent Labs Lab 12/28/15 1706 12/29/15 0425  01/02/16 1041  NA  --  132* 130*  K  --  5.0 4.6  CL  --  95* 92*  CO2  --  30 32  GLUCOSE  --  198* 143*  BUN  --  66* 71*  CREATININE  --  5.90* 6.94*  CALCIUM  --  10.2 10.9*  PHOS 3.7  --  4.4    Liver Function Tests:  Recent Labs Lab 01/02/16 1041  ALBUMIN 1.8*   No results for input(s): LIPASE, AMYLASE in the last 168 hours. No results for input(s): AMMONIA in the last 168 hours.  CBC:  Recent Labs Lab 12/27/15 1347 12/29/15 0425 01/02/16 0506 01/02/16 1041  WBC 6.3 6.0 9.2 8.4  HGB 7.1* 7.5* 7.5* 6.5*  HCT 21.4* 22.2* 21.9* 19.1*  MCV 94.3 91.8 90.8 89.8  PLT 200 264 407 419    Cardiac Enzymes: No results for input(s): CKTOTAL, CKMB, CKMBINDEX, TROPONINI in the last 168 hours.  BNP: Invalid input(s): POCBNP  CBG:  Recent Labs Lab 01/01/16 0740 01/01/16 1143 01/01/16 1725 01/01/16 2153 01/02/16 0742  GLUCAP 186* 196* 157* 148* 135*    Microbiology: Results for orders placed or performed during the hospital encounter of 12/25/15  Blood culture (routine x 2)     Status: Abnormal   Collection Time: 12/25/15  2:32 PM  Result Value Ref Range Status   Specimen Description BLOOD  L HAND  Final   Special Requests   Final    BOTTLES DRAWN AEROBIC AND ANAEROBIC  AER 12 ML ANA  14 ML   Culture  Setup Time   Final    GRAM NEGATIVE RODS IN BOTH AEROBIC AND ANAEROBIC BOTTLES CRITICAL VALUE NOTED.  VALUE IS CONSISTENT WITH PREVIOUSLY REPORTED AND CALLED VALUE. SDR    Culture (A)  Final    ENTEROBACTER CLOACAE SUSCEPTIBILITIES PERFORMED ON PREVIOUS CULTURE WITHIN THE LAST 5 DAYS. Performed at [facility]    Report Status 12/30/2015 FINAL  Final  Blood culture (routine x 2)     Status: Abnormal   Collection Time: 12/25/15  2:32 PM  Result Value Ref Range Status   Specimen Description BLOOD  L FA  Final   Special Requests   Final    BOTTLES DRAWN AEROBIC AND ANAEROBIC  AER 12 ML ANA 12 ML   Culture  Setup Time   Final    GRAM NEGATIVE  RODS IN BOTH AEROBIC AND ANAEROBIC BOTTLES CRITICAL RESULT CALLED TO, READ BACK BY AND VERIFIED WITH: NATE COOKSON AT 0535 12/26/15 SDR Performed at [facility]    Culture ENTEROBACTER CLOACAE (A)  Final   Report Status 12/30/2015 FINAL  Final   Organism ID, Bacteria ENTEROBACTER CLOACAE  Final      Susceptibility   Enterobacter cloacae - MIC*    CEFAZOLIN >=64 RESISTANT Resistant     CEFEPIME <=1 SENSITIVE Sensitive     CEFTAZIDIME 16 INTERMEDIATE Intermediate     CEFTRIAXONE >=64 RESISTANT Resistant     CIPROFLOXACIN >=4 RESISTANT Resistant     GENTAMICIN <=1 SENSITIVE Sensitive     IMIPENEM <=0.25 SENSITIVE Sensitive     TRIMETH/SULFA <=20 SENSITIVE Sensitive     PIP/TAZO >=128 RESISTANT Resistant     * ENTEROBACTER CLOACAE  Blood Culture ID Panel (Reflexed)     Status: Abnormal   Collection Time: 12/25/15  2:32 PM  Result Value Ref Range Status   Enterococcus species NOT DETECTED NOT DETECTED Final   Listeria monocytogenes NOT DETECTED NOT DETECTED Final   Staphylococcus species NOT DETECTED NOT DETECTED Final   Staphylococcus aureus NOT DETECTED NOT DETECTED Final   Streptococcus species NOT DETECTED NOT DETECTED Final   Streptococcus agalactiae NOT DETECTED NOT DETECTED Final   Streptococcus pneumoniae NOT DETECTED NOT DETECTED Final   Streptococcus pyogenes NOT DETECTED NOT DETECTED Final   Acinetobacter baumannii NOT DETECTED NOT DETECTED Final   Enterobacteriaceae species DETECTED (A) NOT DETECTED Final    Comment: CRITICAL RESULT CALLED TO, READ BACK BY AND VERIFIED WITH:  NATE COOKSON AT 0535 12/26/15 SDR    Enterobacter cloacae complex DETECTED (A) NOT DETECTED Corrected    Comment: CRITICAL RESULT CALLED TO, READ BACK BY AND VERIFIED WITH: K MERRILL 12/28/15 @ 1210 M VESTAL CORRECTED ON 09/28 AT 1211: PREVIOUSLY REPORTED AS NOT DETECTED    Escherichia coli NOT DETECTED NOT DETECTED Corrected    Comment: PREVIOUSLY REPORTED AS: DETECTED CORRECTED RESULTS  CALLED TO: K MERRILL 12/28/15 @ 1210 M VESTAL Performed at [facility] CORRECTED ON 09/28 AT 1211: PREVIOUSLY REPORTED AS DETECTED CRITICAL RESULT CALLED TO, READ BACK BY AND VERIFIED WITH:  NATE COOKSON AT 0535 12/26/15 SDR    Klebsiella oxytoca NOT DETECTED NOT DETECTED Final   Klebsiella pneumoniae NOT DETECTED NOT DETECTED Final   Proteus species NOT DETECTED NOT DETECTED Final   Serratia marcescens NOT DETECTED NOT DETECTED Final   Carbapenem resistance NOT DETECTED NOT DETECTED Final   Haemophilus influenzae NOT DETECTED NOT DETECTED Final   Neisseria meningitidis NOT DETECTED NOT DETECTED Final   Pseudomonas aeruginosa NOT DETECTED  NOT DETECTED Final   Candida albicans NOT DETECTED NOT DETECTED Final   Candida glabrata NOT DETECTED NOT DETECTED Final   Candida krusei NOT DETECTED NOT DETECTED Final   Candida parapsilosis NOT DETECTED NOT DETECTED Final   Candida tropicalis NOT DETECTED NOT DETECTED Final  Culture, body fluid-bottle     Status: None   Collection Time: 12/25/15  4:00 PM  Result Value Ref Range Status   Specimen Description FLUID PERITONEAL DIALYSATE  Final   Special Requests BOTTLES DRAWN AEROBIC AND ANAEROBIC 10CC  Final   Culture   Final    NO GROWTH 5 DAYS [facility]    Report Status 12/30/2015 FINAL  Final  Gram stain     Status: None   Collection Time: 12/25/15  4:00 PM  Result Value Ref Range Status   Specimen Description FLUID PERITONEAL DIALYSATE  Final   Special Requests NONE  Final   Gram Stain   Final    FEW WBC PRESENT,BOTH PMN AND MONONUCLEAR NO ORGANISMS SEEN Performed at [facility]    Report Status 12/27/2015 FINAL  Final  MRSA PCR Screening     Status: Abnormal   Collection Time: 12/26/15  1:34 PM  Result Value Ref Range Status   MRSA by PCR POSITIVE (A) NEGATIVE Final    Comment:        The GeneXpert MRSA Assay (FDA approved for NASAL specimens only), is one component of a comprehensive MRSA  colonization surveillance program. It is not intended to diagnose MRSA infection nor to guide or monitor treatment for MRSA infections. RESULT CALLED TO, READ BACK BY AND VERIFIED WITH: AMY TODD RN AT 1455 12/26/15 MSS.   Culture, body fluid-bottle     Status: None   Collection Time: 12/27/15  4:15 PM  Result Value Ref Range Status   Specimen Description PERITONEAL DIALYSATE  Final   Special Requests NONE  Final   Culture   Final    NO GROWTH 5 DAYS Performed at [facility]    Report Status 01/01/2016 FINAL  Final  Gram stain     Status: None   Collection Time: 12/27/15  4:15 PM  Result Value Ref Range Status   Specimen Description PERITONEAL DIALYSATE  Final   Special Requests NONE  Final   Gram Stain   Final    WBC PRESENT,BOTH PMN AND MONONUCLEAR NO ORGANISMS SEEN CYTOSPIN Performed at [facility]    Report Status 12/27/2015 FINAL  Final  CULTURE, BLOOD (ROUTINE X 2) w Reflex to ID Panel     Status: None   Collection Time: 12/28/15  7:19 PM  Result Value Ref Range Status   Specimen Description BLOOD RIGHT AC  Final   Special Requests BOTTLES DRAWN AEROBIC AND ANAEROBIC 10 ML  Final   Culture NO GROWTH 5 DAYS  Final   Report Status 01/02/2016 FINAL  Final    Coagulation Studies: No results for input(s): LABPROT, INR in the last 72 hours.  Urinalysis: No results for input(s): COLORURINE, LABSPEC, PHURINE, GLUCOSEU, HGBUR, BILIRUBINUR, KETONESUR, PROTEINUR, UROBILINOGEN, NITRITE, LEUKOCYTESUR in the last 72 hours.  Invalid input(s): APPERANCEUR    Imaging: Dg Chest 1 View  Result Date: 01/02/2016 CLINICAL DATA:  Status post dialysis catheter placement today. EXAM: CHEST 1 VIEW COMPARISON:  Single-view of the chest 12/25/2015. FINDINGS: New left IJ approach dialysis catheter is in place with the tip projecting at the superior cavoatrial junction. No pneumothorax. Right IJ dialysis catheter present on the  prior exam has been removed. Lungs are clear.  Heart size is normal. No pleural fluid. IMPRESSION: Tip of new dialysis catheter projects at the superior cavoatrial junction. Negative for pneumothorax. Electronically Signed   By: Drusilla Kanner M.D.   On: 01/02/2016 10:27     Medications:     . sodium chloride   Intravenous Once  . calcium acetate  1,334 mg Oral TID WC  . calcium carbonate  1 tablet Oral TID WC  . clopidogrel  75 mg Oral Daily  . colesevelam  625 mg Oral BID WC  . dialysis solution 2.5% low-MG/low-CA   Intraperitoneal Q24H  . docusate sodium  100 mg Oral QHS  . epoetin (EPOGEN/PROCRIT) injection  20,000 Units Subcutaneous Weekly  . feeding supplement (PRO-STAT SUGAR FREE 64)  30 mL Oral TID WC  . gentamicin cream  1 application Topical Daily  . heparin subcutaneous  5,000 Units Subcutaneous Q8H  . hydrALAZINE  25 mg Oral Daily  . insulin aspart  0-10 Units Subcutaneous TID WC  . insulin glargine  8 Units Subcutaneous QHS  . isosorbide mononitrate  60 mg Oral Daily  . lactulose  10 g Oral Daily  . levETIRAcetam  500 mg Oral q morning - 10a  . lisinopril  40 mg Oral Daily  . meropenem (MERREM) IV  1 g Intravenous Q24H  . metaxalone  800 mg Oral BID  . metoprolol  50 mg Oral BID  . mupirocin ointment  1 application Nasal BID  . nicotine  14 mg Transdermal Daily  . nystatin  1 g Topical BID  . pantoprazole  40 mg Oral Daily  . polyethylene glycol  17 g Oral Daily  . ranolazine  500 mg Oral BID  . senna  2 tablet Oral BID  . sodium chloride flush  3 mL Intravenous Q12H  . torsemide  100 mg Oral Daily  . white petrolatum   Topical Q72H   acetaminophen **OR** acetaminophen, bisacodyl, metolazone, metoprolol, morphine injection, nitroGLYCERIN, ondansetron **OR** ondansetron (ZOFRAN) IV, oxyCODONE-acetaminophen  Assessment/ Plan:  50 y.o. white female with end-stage renal disease on hemodialysis, asthma, gastroparesis, diabetes mellitus type II, congestive heart failure, hypertension, endometriosis, coronary  atherosclerosis  [facility]/ [address]/ TTS   1. End-stage renal disease on hemodialysis dialysis: seen and examined on hemodialysis. Tolerating treatment well. UF goal of 1.5 litres. - continues to want to go back to PD.   2. Anemia of CKD: hemoglobin 6.5.  - PRBC transfusion 1 unit ordered - EPO with treatment.   3. Sepsis with bactermia: 9/25 blood cultures positive for enterobacter. Tunneled catheter removed. Aztreonam given on 9/25. Levofloxacin ordered from 9/26-10/1, Meropenem ordered from 9/30.   4. Secondary Hyperparathyroidism:  Patient taken off of calcitriol as PTH low and hypercalcemia. Phosphorus at goal.  Calcium high at 10.9  - calcium acetate  reduced to 2 tabs with meals.  - Continue to monitor calcium levels.    LOS: 6 Kimberly Montgomery 10/3/201711:31 AM

## 2016-01-02 NOTE — Progress Notes (Signed)
Post hd assessment 

## 2016-01-02 NOTE — Progress Notes (Signed)
PT Cancellation Note  Patient Details Name: Kimberly Montgomery MRN: [id number] DOB: April 25, 1965   Cancelled Treatment:    Reason Eval/Treat Not Completed: Patient at procedure or test/unavailable Pt has been out of room for dialysis most of the day, PT per availability.  Kinneth Fujiwara R Pailyn Bellevue 01/02/2016, 1:20 PM

## 2016-01-02 NOTE — Progress Notes (Signed)
Patient requesting to speak with social work about discharging to [facility]. Eric aware and to speak with patient.

## 2016-01-02 NOTE — Progress Notes (Addendum)
1`Patient ID: Kimberly Montgomery, female   DOB: [birth year], 50 y.o.   MRN: [id number]   [employer] [facility] [facility] [place] at [facility]   PATIENT NAME: Kimberly Montgomery    MR#:  [id number]  DATE OF BIRTH:  08-08-1965  SUBJECTIVe;getting HD thru Perma cath today.will get   One unit PRBC during HD.  CHIEF COMPLAINT:   Chief Complaint  Patient presents with  . Chest Pain  50 year old female with past medical history of end-stage renal disease on hemodialysis, hypertension, hyperlipidemia, peripheral vascular disease, diabetes, history of coronary artery disease who Was admitted to the hospital due to chest pain. Found to have bacteremia with Enterobacter, permacath removed. For new permacath placement today. REVIEW OF SYSTEMS:  ROS CONSTITUTIONAL: No fever/chills, fatigue, weakness, weight gain/loss, headache. EYES: No blurry or double vision. ENT: No tinnitus, postnasal drip, redness or soreness of the oropharynx.has neck pain, RESPIRATORY: No cough, dyspnea, wheeze, hemoptysis.  CARDIOVASCULAR: No chest pain, palpitations, syncope, orthopnea,  GASTROINTESTINAL: No nausea, vomiting, constipation, diarrhea, abdominal pain, hematemesis, melena or hematochezia. GENITOURINARY: No dysuria, frequency, hematuria. ENDOCRINE: No polyuria or nocturia. No heat or cold intolerance. HEMATOLOGY: No anemia, bruising, bleeding. INTEGUMENTARY: No rashes, ulcers, lesions. MUSCULOSKELETAL: No arthritis, gout, edema. NEUROLOGIC: No numbness, tingling, ataxia, seizure-type activity, weakness. PSYCHIATRIC: No anxiety, depression, insomnia.   DRUG ALLERGIES:   Allergies  Allergen Reactions  . Cephalosporins Anaphylaxis  . Penicillins Anaphylaxis and Other (See Comments)    Has patient had a PCN reaction causing immediate rash, facial/tongue/throat swelling, SOB or lightheadedness with hypotension: Yes Has patient had a PCN reaction causing severe rash involving mucus membranes or skin necrosis: No Has  patient had a PCN reaction that required hospitalization No Has patient had a PCN reaction occurring within the last 10 years: No If all of the above answers are "NO", then may proceed with Cephalosporin use.  . Lamictal [Lamotrigine] Other (See Comments)    Reaction:  Hallucinations  . Phenergan [Promethazine Hcl] Nausea And Vomiting  . Pravastatin Other (See Comments)    Reaction:  Muscle pain   . Sulfa Antibiotics Other (See Comments)    Reaction:  Unknown    VITALS:  Blood pressure (!) 138/56, pulse (!) 59, temperature 98.5 F (36.9 C), temperature source Oral, resp. rate 18, height 5\' 10"  (1.778 m), weight 116.2 kg (256 lb 2.8 oz), last menstrual period 03/16/2015, SpO2 100 %. PHYSICAL EXAMINATION:  Physical Exam  GENERAL:  50 y.o.-year-old white female patient lying in the bed in no acute distress. Appears chronically ill.  EYES: Pupils equal, round, reactive to light and accommodation. No scleral icterus. Extraocular muscles intact.  HEENT: Head atraumatic, normocephalic. Oropharynx and nasopharynx clear. No oropharyngeal erythema, moist oral mucosa  NECK:  .  no jugular venous distention. No thyroid enlargement, no tenderness.  LUNGS: Normal breath sounds bilaterally, no wheezing, rales, rhonchi. No use of accessory muscles of respiration.  CARDIOVASCULAR: S1, S2 RRR. No murmurs, rubs, gallops, clicks. New permacath   ABDOMEN: Soft, nontender, nondistended. Bowel sounds present. No organomegaly or mass. Positive peritoneal dialysis catheter in place without acute drainage. EXTREMITIES: No pedal edema, cyanosis, or clubbing. + 2 pedal & radial pulses b/l.   NEUROLOGIC: Cranial nerves II through XII are intact. No focal Motor or sensory deficits appreciated b/l.  Globally weak.   PSYCHIATRIC: The patient is alert and oriented x 2.  SKIN: No obvious rash, lesion, or ulcer.    LABORATORY PANEL:   CBC  Recent Labs Lab 01/02/16 1041  WBC 8.4  HGB 6.5*  HCT 19.1*  PLT 419    ------------------------------------------------------------------------------------------------------------------ Chemistries   Recent Labs Lab 01/02/16 1041  NA 130*  K 4.6  CL 92*  CO2 32  GLUCOSE 143*  BUN 71*  CREATININE 6.94*  CALCIUM 10.9*   RADIOLOGY:  Dg Chest 1 View  Result Date: 01/02/2016 CLINICAL DATA:  Status post dialysis catheter placement today. EXAM: CHEST 1 VIEW COMPARISON:  Single-view of the chest 12/25/2015. FINDINGS: New left IJ approach dialysis catheter is in place with the tip projecting at the superior cavoatrial junction. No pneumothorax. Right IJ dialysis catheter present on the prior exam has been removed. Lungs are clear. Heart size is normal. No pleural fluid. IMPRESSION: Tip of new dialysis catheter projects at the superior cavoatrial junction. Negative for pneumothorax. Electronically Signed   By: Drusilla Kannerhomas  Dalessio M.D.   On: 01/02/2016 10:27   ASSESSMENT AND PLAN:   1. Chest pain with elevated troponin which is trending down. 1.01-0.63-0.54. Likely secondary to bacteremia: No chest pain today. No further workup . 2. Sepsis - blood cultures positive for enterococcus  Continue Levaquin and follow-up sensitivities. Urinalysis and chest x-ray have been negative. Patient has permacath  removed, Peritoneal fluid cultures are negative to date.rpt blood cultures from 9/28 are negative to date.He seen by ID, continue Levaquin for 14 days. Appreciate nephrology following  New permacath placed on 10/3/. [facility].  Slightly elevated troponin secondary to demand ischemia rather than acute MI. Seen by cardiology . 2. End-stage renal disease on hemodialysis-patient gets scheduled dialysis on Tuesday Thursday Saturday.now we removed permacath due to bacteremic.had PD in place,' going  for  New permacath placement for in preparation for discharge to nursing home  3. Diabetes type 2 without complication-continue Lantus, sliding scale  insulin.  4. Essential hypertension-continue lisinopril, metoprolol, Imdur.  5. History of seizures-continue Keppra.  6. GERD-continue Protonix.  7. Secondary hyperparathyroidism-continue calcium acetate.  #8. Chronic deconditioning: Patient says that she is bedbound .  physical therapy  Consulted   #9. Lethargy likely due to sepsis; improved. 10.anemia of CKD;transfuse one unit prbc,check stool for guaic,check cbc am. 11.constipation and some abdominal pain;check xray abdomen,continue stool softners  #9 constipation: Continue stool softeners..  Discussed with patient, appreciate presence of  RN  during rounds. #10 neck pain secondary to possible muscle pull: improved Possible d/c [facility] SNF tomorrow,[facility]  D/w RN ,nephrologist  All the records are reviewed and case discussed with Care Management/Social Worker. Management plans discussed with the patient, family and they are in agreement.  CODE STATUS: FULL  TOTAL TIME TAKING CARE OF THIS PATIENT: 25 minutes.   More than 50% of the time was spent in counseling/coordination of care: YES  POSSIBLE D/C IN 1-2 DAYS, DEPENDING ON CLINICAL CONDITION.   Katha HammingKONIDENA,Makana Rostad M.D on 01/02/2016 at 5:18 PM  Between 7am to 6pm - Pager - [phone]  After 6pm go to [email or web address] - Social research officer, governmentpassword EPAS [facility]  [facility] [place] [facility]  Office  [phone]  CC: Primary care physician; Jarome MatinALE, PATRICK D, MD  Note: This dictation was prepared with Dragon dictation along with smaller phrase technology. Any transcriptional errors that result from this process are unintentional.

## 2016-01-02 NOTE — Progress Notes (Signed)
Post hd vitals 

## 2016-01-02 NOTE — Progress Notes (Signed)
Pre hd assessment  

## 2016-01-02 NOTE — Clinical Social Work Note (Addendum)
MSW spoke to [facility] who confirmed they can accept patient once she is medically ready for discharge and orders have been received.  Kimberly Montgomery received a phone call from Kimberly Montgomery, who is still requesting PT notes for authorization.  MSW to continue to follow patient's progress throughout discharge planning.  3:00pm  MSW was informed that patient would like to speak to MSW.  MSW spoke to patient and informed her that Kimberly Montgomery can accept her once she is medically ready for discharge.  Patient stated she was relieved that she is able to go to [facility], MSW continuing to follow patient's progress throughout discharge planning.  Kimberly Montgomery. Kimberly Montgomery, MSW [phone]  Mon-Fri 8a-4:30p 01/02/2016 12:13 PM

## 2016-01-02 NOTE — Progress Notes (Signed)
Pharmacy Antibiotic Note  Kimberly Montgomery is a 50 y.o. female admitted on 12/25/2015 with bacteremia.  Pharmacy has been consulted for meropenem dosing.  Plan: Patient currently on day 4 of meropenem initiated on 9/30. Transitioned from two days of meropenem 500 mg IV q24h to 1000mg  IV Q24hr. Patient had PermCath placed today.    Per ID note, will need 14 days of abx for this bacteremia   Height: 5\' 10"  (177.8 cm) Weight: 257 lb 8 oz (116.8 kg) IBW/kg (Calculated) : 68.5  Temp (24hrs), Avg:98.2 F (36.8 C), Min:97.4 F (36.3 C), Max:98.9 F (37.2 C)   Recent Labs Lab 12/27/15 1347 12/29/15 0425 01/02/16 0506 01/02/16 1041  WBC 6.3 6.0 9.2 8.4  CREATININE  --  5.90*  --  6.94*    Estimated Creatinine Clearance: 13.1 mL/min (by C-G formula based on SCr of 6.94 mg/dL (H)).    Allergies  Allergen Reactions  . Cephalosporins Anaphylaxis  . Penicillins Anaphylaxis and Other (See Comments)    Has patient had a PCN reaction causing immediate rash, facial/tongue/throat swelling, SOB or lightheadedness with hypotension: Yes Has patient had a PCN reaction causing severe rash involving mucus membranes or skin necrosis: No Has patient had a PCN reaction that required hospitalization No Has patient had a PCN reaction occurring within the last 10 years: No If all of the above answers are "NO", then may proceed with Cephalosporin use.  . Lamictal [Lamotrigine] Other (See Comments)    Reaction:  Hallucinations  . Phenergan [Promethazine Hcl] Nausea And Vomiting  . Pravastatin Other (See Comments)    Reaction:  Muscle pain   . Sulfa Antibiotics Other (See Comments)    Reaction:  Unknown     Antimicrobials this admission: Aztreonam 9/25 >> 9/25 Vancomycin  9/25 >> 9/25   Levofloxacin 9/26 >> 9/30  Meropenem 9/30 >>    Microbiology results: 9/26 MRSA PCR: Positive 9/25 BCx: Enterobacter Cloacae, S - Cefepime, Gentamicin, Imipenem, Septra   Pharmacy will continue to monitor and  adjust per consult.   Horris Latino, PharmD Pharmacy Resident 01/02/2016 2:18 PM

## 2016-01-02 NOTE — Progress Notes (Signed)
  SUBJECTIVE: Kimberly Montgomery is resting in bed, nearly flat without any dyspnea. She is sore at her permacath insertion site but otherwise denies any discomfort.   Vitals:   01/01/16 1630 01/01/16 1645 01/01/16 2043 01/02/16 0419  BP: (!) 180/70 (!) 176/74 (!) 159/49 (!) 169/60  Pulse: 81 77 79 74  Resp: 18 19 18 18   Temp:   98.9 F (37.2 C) 98.5 F (36.9 C)  TempSrc:   Oral Oral  SpO2: 98% 98% 99% 100%  Weight:    261 lb 1.6 oz (118.4 kg)  Height:        Intake/Output Summary (Last 24 hours) at 01/02/16 0846 Last data filed at 01/02/16 0612  Gross per 24 hour  Intake                0 ml  Output             1071 ml  Net            -1071 ml    LABS: Basic Metabolic Panel: No results for input(s): NA, K, CL, CO2, GLUCOSE, BUN, CREATININE, CALCIUM, MG, PHOS in the last 72 hours. Liver Function Tests: No results for input(s): AST, ALT, ALKPHOS, BILITOT, PROT, ALBUMIN in the last 72 hours. No results for input(s): LIPASE, AMYLASE in the last 72 hours. CBC:  Recent Labs  01/02/16 0506  WBC 9.2  HGB 7.5*  HCT 21.9*  MCV 90.8  PLT 407   Cardiac Enzymes: No results for input(s): CKTOTAL, CKMB, CKMBINDEX, TROPONINI in the last 72 hours. BNP: Invalid input(s): POCBNP D-Dimer: No results for input(s): DDIMER in the last 72 hours. Hemoglobin A1C: No results for input(s): HGBA1C in the last 72 hours. Fasting Lipid Panel: No results for input(s): CHOL, HDL, LDLCALC, TRIG, CHOLHDL, LDLDIRECT in the last 72 hours. Thyroid Function Tests: No results for input(s): TSH, T4TOTAL, T3FREE, THYROIDAB in the last 72 hours.  Invalid input(s): FREET3 Anemia Panel: No results for input(s): VITAMINB12, FOLATE, FERRITIN, TIBC, IRON, RETICCTPCT in the last 72 hours.   PHYSICAL EXAM General: Chronically ill-appearing HEENT:  Normocephalic and atramatic Neck:  No JVD.  Lungs: Clear bilaterally to auscultation and percussion. Heart: HRRR . Normal S1 and S2 without gallops or murmurs.   Abdomen: Bowel sounds are positive, abdomen soft and non-tender  Msk: Right lower extremity amputation Extremities: No clubbing, cyanosis, trace left pedal edema  Neuro: Alert and oriented X 3. Psych:  Good affect, responds appropriately  TELEMETRY: Sinus rhythm with rates in the 70s  ASSESSMENT AND PLAN: Mildly elevated troponin with chest pain on admission and history of coronary artery disease. She was found to have Escherichia coli bacteremia related to her dialysis catheter and this was probably the cause of her chest pain and elevated troponin. She has been treated with antibiotics and has received a new permacath yesterday. Cardiovascular wise, she is stable for discharge.  Active Problems:   Chest pain   Bacteremia    Berton Bon, NP 01/02/2016 8:46 AM

## 2016-01-02 NOTE — Progress Notes (Signed)
End of hd tx  

## 2016-01-03 LAB — CBC
HCT: 24.8 % — ABNORMAL LOW (ref 35.0–47.0)
HEMOGLOBIN: 8.3 g/dL — AB (ref 12.0–16.0)
MCH: 30 pg (ref 26.0–34.0)
MCHC: 33.5 g/dL (ref 32.0–36.0)
MCV: 89.6 fL (ref 80.0–100.0)
PLATELETS: 430 10*3/uL (ref 150–440)
RBC: 2.77 MIL/uL — AB (ref 3.80–5.20)
RDW: 15.1 % — ABNORMAL HIGH (ref 11.5–14.5)
WBC: 11.5 10*3/uL — AB (ref 3.6–11.0)

## 2016-01-03 LAB — GLUCOSE, CAPILLARY
GLUCOSE-CAPILLARY: 123 mg/dL — AB (ref 65–99)
GLUCOSE-CAPILLARY: 133 mg/dL — AB (ref 65–99)
GLUCOSE-CAPILLARY: 117 mg/dL — AB (ref 65–99)
GLUCOSE-CAPILLARY: 118 mg/dL — AB (ref 65–99)

## 2016-01-03 LAB — TYPE AND SCREEN
ABO/RH(D): A POS
Antibody Screen: NEGATIVE
Unit division: 0

## 2016-01-03 MED ORDER — SODIUM CHLORIDE 0.9 % IV SOLN
1.0000 g | Freq: Once | INTRAVENOUS | Status: AC
  Administered 2016-01-03: 1 g via INTRAVENOUS
  Filled 2016-01-03: qty 1

## 2016-01-03 MED ORDER — OXYCODONE-ACETAMINOPHEN 5-325 MG PO TABS: 1.0000 | ORAL_TABLET | Freq: Four times a day (QID) | ORAL | 0 refills | Status: DC | PRN

## 2016-01-03 MED ORDER — BISACODYL 10 MG RE SUPP
10.0000 mg | Freq: Once | RECTAL | Status: AC
  Administered 2016-01-03: 10 mg via RECTAL
  Filled 2016-01-03: qty 1

## 2016-01-03 NOTE — Progress Notes (Addendum)
1`Patient ID: Kimberly Montgomery, female   DOB: 11/01/1965, 50 y.o.   MRN: [id number]   [employer] - [place] at [facility]   PATIENT NAME: Kimberly Montgomery    MR#:  [id number]  DATE OF BIRTH:  04/06/1965  SUBJECTIVe;complaints of pain in suprapubic area.c/o pain ,requring IV pain meds No.fever   CHIEF COMPLAINT:   Chief Complaint  Patient presents with  . Chest Pain  50 year old female with past medical history of end-stage renal disease on hemodialysis, hypertension, hyperlipidemia, peripheral vascular disease, diabetes, history of coronary artery disease who Was admitted to the hospital due to chest pain. Found to have bacteremia with Enterobacter, permacath removed. For new permacath placement today. REVIEW OF SYSTEMS:  Review of Systems  Respiratory: Positive for shortness of breath.    CONSTITUTIONAL: No fever/chills, fatigue, weakness, weight gain/loss, headache. EYES: No blurry or double vision. ENT: No tinnitus, postnasal drip, redness or soreness of the oropharynx.has neck pain, RESPIRATORY: No cough, dyspnea, wheeze, hemoptysis.  CARDIOVASCULAR: No chest pain, palpitations, syncope, orthopnea,  GASTROINTESTINAL: No nausea, vomiting, constipation, diarrhea, abdominal pain, hematemesis, melena or hematochezia. GENITOURINARY: No dysuria, frequency, [email or web address].mild suprapubic pain. ENDOCRINE: No polyuria or nocturia. No heat or cold intolerance. HEMATOLOGY: No anemia, bruising, bleeding. INTEGUMENTARY: No rashes, ulcers, lesions. MUSCULOSKELETAL: No arthritis, gout, edema. NEUROLOGIC: No numbness, tingling, ataxia, seizure-type activity, weakness. PSYCHIATRIC: No anxiety, depression, insomnia.   DRUG ALLERGIES:   Allergies  Allergen Reactions  . Cephalosporins Anaphylaxis  . Penicillins Anaphylaxis and Other (See Comments)    Has patient had a PCN reaction causing immediate rash, facial/tongue/throat swelling, SOB or lightheadedness with hypotension: Yes Has  patient had a PCN reaction causing severe rash involving mucus membranes or skin necrosis: No Has patient had a PCN reaction that required hospitalization No Has patient had a PCN reaction occurring within the last 10 years: No If all of the above answers are "NO", then may proceed with Cephalosporin use.  . Lamictal [Lamotrigine] Other (See Comments)    Reaction:  Hallucinations  . Phenergan [Promethazine Hcl] Nausea And Vomiting  . Pravastatin Other (See Comments)    Reaction:  Muscle pain   . Sulfa Antibiotics Other (See Comments)    Reaction:  Unknown    VITALS:  Blood pressure (!) 174/86, pulse 74, temperature 97.8 F (36.6 C), temperature source Oral, resp. rate 16, height 5\' 10"  (1.778 m), weight 116.1 kg (255 lb 15.3 oz), last menstrual period 03/16/2015, SpO2 99 %. PHYSICAL EXAMINATION:  Physical Exam  GENERAL:  50 y.o.-year-old white female patient lying in the bed in no acute distress. Appears chronically ill.  EYES: Pupils equal, round, reactive to light and accommodation. No scleral icterus. Extraocular muscles intact.  HEENT: Head atraumatic, normocephalic. Oropharynx and nasopharynx clear. No oropharyngeal erythema, moist oral mucosa  NECK:  .  no jugular venous distention. No thyroid enlargement, no tenderness.  LUNGS: Normal breath sounds bilaterally, no wheezing, rales, rhonchi. No use of accessory muscles of respiration.  CARDIOVASCULAR: S1, S2 RRR. No murmurs, rubs, gallops, clicks. New permacath   ABDOMEN: Soft, nontender, nondistended. Bowel sounds present. No organomegaly or mass. Positive peritoneal dialysis catheter in place without acute drainage.pubic tenderness present EXTREMITIES: No pedal edema, cyanosis, or clubbing. + 2 pedal & radial pulses b/l.   NEUROLOGIC: Cranial nerves II through XII are intact. No focal Motor or sensory deficits appreciated b/l.  Globally weak.   PSYCHIATRIC: The patient is alert and oriented x 2.  SKIN: No obvious rash, lesion, or  ulcer.  LABORATORY PANEL:   CBC  Recent Labs Lab 01/03/16 0424  WBC 11.5*  HGB 8.3*  HCT 24.8*  PLT 430   ------------------------------------------------------------------------------------------------------------------ Chemistries   Recent Labs Lab 01/02/16 1041  NA 130*  K 4.6  CL 92*  CO2 32  GLUCOSE 143*  BUN 71*  CREATININE 6.94*  CALCIUM 10.9*   RADIOLOGY:  Dg Abd 1 View  Result Date: 01/02/2016 CLINICAL DATA:  50 year old female with abdominal pain and constipation. EXAM: ABDOMEN - 1 VIEW COMPARISON:  Radiograph dated 07/31/2015 FINDINGS: Large amount of stool noted throughout the visualized colon. Dense stool noted in the rectosigmoid. No bowel dilatation or evidence of obstruction. There is no free air or radiopaque calculi. The soft tissues and osseous structures appear unremarkable. IMPRESSION: Constipation.  No bowel obstruction. Electronically Signed   By: Elgie Collard M.D.   On: 01/02/2016 20:03   ASSESSMENT AND PLAN:   1. Chest pain with elevated troponin which is trending down. 1.01-0.63-0.54. Likely secondary to bacteremia: No chest pain today. No further workup . 2. Sepsis - blood cultures positive for enterococcus  Continue Levaquin and follow-up sensitivities. Urinalysis and chest x-ray have been negative. Patient has permacath  removed, Peritoneal fluid cultures are negative to date.rpt blood cultures from 9/28 are negative to date.He seen by ID,  Last dose of meropenam today at 6 pm.no need for abx at SNF.  Unable to discharge because  She is still requiring IV pain meds,Will discharge her tomorrow,  Appreciate nephrology following  New permacath placed on 10/3/. Pt wants to go to [facility].  Slightly elevated troponin secondary to demand ischemia rather than acute MI. Seen by cardiology . 2. End-stage renal disease on hemodialysis-patient gets scheduled dialysis on Tuesday Thursday Saturday.now we removed permacath due to bacteremic.had  PD in place,' going  for  New permacath placement for in preparation for discharge to nursing home  3. Diabetes type 2 without complication-continue Lantus, sliding scale insulin.  4. Essential hypertension-continue lisinopril, metoprolol, Imdur.  5. History of seizures-continue Keppra.  6. GERD-continue Protonix.  7. Secondary hyperparathyroidism-continue calcium acetate.  #8. Chronic deconditioning: Patient says that she is bedbound .  physical therapy  Consulted   #9. Lethargy likely due to sepsis; improved. 10.anemia of CKD;transfuse one unit prbc,check stool for guaic,check cbc am. 11.constipation and some abdominal pain;cabdomen xray is negative for obstruction,  Discussed with patient, appreciate presence of  RN  during rounds. #10 neck pain secondary to possible muscle pull: improved Possible d/c to SNF tomorrow,[facility]  D/w RN ,nephrologist  All the records are reviewed and case discussed with Care Management/Social Worker. Management plans discussed with the patient, family and they are in agreement.  CODE STATUS: FULL  TOTAL TIME TAKING CARE OF THIS PATIENT: 25 minutes.   More than 50% of the time was spent in counseling/coordination of care: YES  POSSIBLE D/C IN 1-2 DAYS, DEPENDING ON CLINICAL CONDITION.   Katha Hamming M.D on 01/03/2016 at 11:06 AM  Between 7am to 6pm - Pager - [phone]  After 6pm go to [email or web address] - Social research officer, government  [facility] [place] [facility]  Office  [phone]  CC: Primary care physician; Jarome Matin, MD  Note: This dictation was prepared with Dragon dictation along with smaller phrase technology. Any transcriptional errors that result from this process are unintentional.

## 2016-01-03 NOTE — Evaluation (Signed)
Physical Therapy Evaluation Patient Details Name: Kimberly Montgomery MRN: [id number] DOB: 12/20/1965 Today's Date: 01/03/2016   History of Present Illness  Pt is a 50 year old female with past medical history of end-stage renal disease on hemodialysis, hypertension, hyperlipidemia, peripheral vascular disease, diabetes, history of coronary artery disease who was admitted to the hospital due to chest pain. Found to have bacteremia with Enterobacter, permacath removed with new permacath placement.  Clinical Impression  Pt presents with deficits in strength, transfers, mobility, gait, balance, and activity tolerance.  Pt required max A with all bed mobility tasks and min-mod A with sitting balance at EOB.  Pt lethargic during session but slowly improved during the session.  Pt presents with h/o RLE BKA with liner donned but does not have her prosthetic limb at this facility.  Per pt does not transfer or amb without prosthetic limb and unsure if anyone can bring it to her.  Transfers and gait would likely have been unsafe to attempt at this time even with prosthetic limb donned secondary to lethargy and significant functional weakness.  Pt will benefit from PT services to address above deficits for decreased caregiver assistance upon discharge.     Follow Up Recommendations SNF    Equipment Recommendations  None recommended by PT (Need to confirm that pt owns a RW when pt is less lethargic)    Recommendations for Other Services       Precautions / Restrictions Precautions Precautions: Fall Restrictions Weight Bearing Restrictions: No      Mobility  Bed Mobility Overal bed mobility: Needs Assistance Bed Mobility: Rolling;Sidelying to Sit;Sit to Sidelying Rolling: Max assist Sidelying to sit: Max assist     Sit to sidelying: Max assist General bed mobility comments: Max assist with bed mobility tasks with mod-max verbal cues for sequencing  Transfers                 General  transfer comment: Pt declined transfer attempt at this time secondary to fatigue and not having her R prosthetic limb.  Pt reports performs all transfers and gait only with prosthetic limb donned.  Ambulation/Gait             General Gait Details: Pt declined gait attempt at this time secondary to fatigue and not having her R prosthetic limb.  Pt reports performs all transfers and gait only with prosthetic limb donned  Stairs            Wheelchair Mobility    Modified Rankin (Stroke Patients Only)       Balance Overall balance assessment: Needs assistance   Sitting balance-Leahy Scale: Poor Sitting balance - Comments: Min-mod A to maintain sitting balance at EOB Postural control: Posterior lean;Left lateral lean                                   Pertinent Vitals/Pain Pain Assessment: No/denies pain    Home Living Family/patient expects to be discharged to:: Assisted living               Home Equipment: Walker - 2 wheels      Prior Function Level of Independence: Independent with assistive device(s)         Comments: Pt somewhat lethargic and confused at times but reports ind amb with FWW at ALF with assistance for ADLs     Hand Dominance        Extremity/Trunk Assessment   Upper  Extremity Assessment: Generalized weakness           Lower Extremity Assessment: Generalized weakness         Communication   Communication: No difficulties  Cognition Arousal/Alertness: Lethargic Behavior During Therapy: WFL for tasks assessed/performed Overall Cognitive Status: Within Functional Limits for tasks assessed                      General Comments      Exercises Total Joint Exercises Ankle Circles/Pumps: Left;10 reps;15 reps;AAROM;AROM Quad Sets: Both;10 reps;15 reps Gluteal Sets: Both;10 reps;15 reps Heel Slides: AAROM;Left;10 reps Hip ABduction/ADduction: AAROM;Both;10 reps Straight Leg Raises: AAROM;Both;10  reps Other Exercises Other Exercises: HEP education/practice with L APs, B QS and GS x 10 each every hour    Assessment/Plan    PT Assessment Patient needs continued PT services  PT Problem List Decreased strength;Decreased activity tolerance;Decreased balance;Decreased mobility          PT Treatment Interventions DME instruction;Gait training;Functional mobility training;Therapeutic activities;Therapeutic exercise;Balance training;Neuromuscular re-education;Patient/family education    PT Goals (Current goals can be found in the Care Plan section)  Acute Rehab PT Goals Patient Stated Goal: To walk and increase strength PT Goal Formulation: With patient Time For Goal Achievement: 01/16/16 Potential to Achieve Goals: Fair    Frequency Min 2X/week   Barriers to discharge        Co-evaluation               End of Session Equipment Utilized During Treatment: Oxygen Activity Tolerance: Patient limited by fatigue Patient left: in bed;with bed alarm set;with call bell/phone within reach;Other (comment) (With pillows under R side tilting pt slightly to the L per nursing) Nurse Communication: Mobility status;Need for lift equipment         Time: 1330-1406 PT Time Calculation (min) (ACUTE ONLY): 36 min   Charges:   PT Evaluation $PT Eval Low Complexity: 1 Procedure PT Treatments $Therapeutic Exercise: 8-22 mins   PT G Codes:        DElly Modena PT, DPT 01/03/16, 2:41 PM

## 2016-01-03 NOTE — Progress Notes (Signed)
Pharmacy Antibiotic Note  Kimberly Montgomery is a 50 y.o. female admitted on 12/25/2015 with bacteremia.  Pharmacy has been consulted for meropenem dosing.  Plan: Patient currently on day 5 of meropenem initiated on 9/30. Transitioned from two days of meropenem 500 mg IV q24h to 1000mg  IV Q24hr. Patient had PermCath placed yesterday.    Per ID patient has improved clinically and meropenem will be stopped after 5th total dose today.   Height: 5\' 10"  (177.8 cm) Weight: 255 lb 15.3 oz (116.1 kg) IBW/kg (Calculated) : 68.5  Temp (24hrs), Avg:98.1 F (36.7 C), Min:97.8 F (36.6 C), Max:98.5 F (36.9 C)   Recent Labs Lab 12/29/15 0425 01/02/16 0506 01/02/16 1041 01/03/16 0424  WBC 6.0 9.2 8.4 11.5*  CREATININE 5.90*  --  6.94*  --     Estimated Creatinine Clearance: 13.1 mL/min (by C-G formula based on SCr of 6.94 mg/dL (H)).    Allergies  Allergen Reactions  . Cephalosporins Anaphylaxis  . Penicillins Anaphylaxis and Other (See Comments)    Has patient had a PCN reaction causing immediate rash, facial/tongue/throat swelling, SOB or lightheadedness with hypotension: Yes Has patient had a PCN reaction causing severe rash involving mucus membranes or skin necrosis: No Has patient had a PCN reaction that required hospitalization No Has patient had a PCN reaction occurring within the last 10 years: No If all of the above answers are "NO", then may proceed with Cephalosporin use.  . Lamictal [Lamotrigine] Other (See Comments)    Reaction:  Hallucinations  . Phenergan [Promethazine Hcl] Nausea And Vomiting  . Pravastatin Other (See Comments)    Reaction:  Muscle pain   . Sulfa Antibiotics Other (See Comments)    Reaction:  Unknown     Antimicrobials this admission: Aztreonam 9/25 >> 9/25 Vancomycin  9/25 >> 9/25   Levofloxacin 9/26 >> 9/30  Meropenem 9/30 >> 10/4   Microbiology results: 9/26 MRSA PCR: Positive 9/25 BCx: Enterobacter Cloacae, S - Cefepime, Gentamicin,  Imipenem, Septra   Pharmacy will continue to monitor and adjust per consult.   Horris Latino, PharmD Pharmacy Resident 01/03/2016 2:09 PM

## 2016-01-03 NOTE — Clinical Social Work Note (Addendum)
MSW received phone call from [facility] [phone] at [facility] on [address] regarding patient's dialysis appointments.  Patient goes to Dialysis Tuesday, Thursday, and Saturday at 6:40am.  MSW updated [facility] that patient will be going to [facility] from hospital and they will provide transportation to dialysis.  MSW to continue to follow patient's progress throughout discharge planning.  3:20pm MSW faxed clinicals to [facility] for insurance approval, awaiting response to determine if patient can go to SNF using her insurance benefits.  Ervin Knack. Kinisha Soper, MSW [phone]  Mon-Fri 8a-4:30p 01/03/2016 2:40 PM

## 2016-01-03 NOTE — Care Management (Signed)
Spoke with attending and primary nurse.  patient has been complaining of abdominal pain and has received 5 doses of IV pain meds within the past 24 hours.  Primary nurse says patient is complaining of bladder distention due to urinary retention.  Discussed that patient is esrd and does not make urine.  Patient has told the primary nurse that she does make urine.  Nurse to bladder scan and inform attending.  Patient die receive one unit of packed cells within the past 24 hours. Patient has chronic pain control issues.  Anticipate discharge to skilled nursing facility 10.5.2017

## 2016-01-03 NOTE — Progress Notes (Signed)
[facility]  ROUNDING NOTE   Subjective:   Hemodialysis treatment yesterday. Tolerated treatment well. UF of 1400.  PRBC 1 unit transfusion yesterday  Patient complains of constipation and pain this morning.     Objective:  Vital signs in last 24 hours:  Temp:  [97.8 F (36.6 C)-98.5 F (36.9 C)] 97.8 F (36.6 C) (10/04 0410) Pulse Rate:  [59-74] 74 (10/04 0921) Resp:  [13-18] 16 (10/04 0410) BP: (126-174)/(48-86) 174/86 (10/04 0921) SpO2:  [99 %-100 %] 99 % (10/04 0921) FiO2 (%):  [100 %] 100 % (10/03 1230) Weight:  [116.1 kg (255 lb 15.3 oz)-116.8 kg (257 lb 8 oz)] 116.1 kg (255 lb 15.3 oz) (10/04 0410)  Weight change: -1.034 kg (-2 lb 4.5 oz) Filed Weights   01/02/16 1345 01/02/16 1431 01/03/16 0410  Weight: 116.8 kg (257 lb 8 oz) 116.2 kg (256 lb 2.8 oz) 116.1 kg (255 lb 15.3 oz)    Intake/Output: I/O last 3 completed shifts: In: 200 [IV Piggyback:200] Out: 1425 [Urine:25; Other:1400]   Intake/Output this shift:  No intake/output data recorded.  Physical Exam: General: Laying down in bed  Head: Normocephalic, atraumatic. Moist oral mucosal membranes  Eyes: Anicteric  Neck: Supple, trachea midline  Lungs:  Clear to auscultation, normal effort  Heart: S1S2 no rubs  Abdomen:  Soft, nontender, BS present, PD catheter in place  Extremities: RLE BKA, no lower extremity edema  Neurologic: Nonfocal, moving all four extremities  Skin: No lesions  Access: PD cath in place, [facility] permcath Dr. Wyn Quaker 10/2    Basic Metabolic Panel:  Recent Labs Lab 12/28/15 1706 12/29/15 0425 01/02/16 1041  NA  --  132* 130*  K  --  5.0 4.6  CL  --  95* 92*  CO2  --  30 32  GLUCOSE  --  198* 143*  BUN  --  66* 71*  CREATININE  --  5.90* 6.94*  CALCIUM  --  10.2 10.9*  PHOS 3.7  --  4.4    Liver Function Tests:  Recent Labs Lab 01/02/16 1041  ALBUMIN 1.8*   No results for input(s): LIPASE, AMYLASE in the last 168 hours. No results for input(s): AMMONIA in  the last 168 hours.  CBC:  Recent Labs Lab 12/27/15 1347 12/29/15 0425 01/02/16 0506 01/02/16 1041 01/03/16 0424  WBC 6.3 6.0 9.2 8.4 11.5*  HGB 7.1* 7.5* 7.5* 6.5* 8.3*  HCT 21.4* 22.2* 21.9* 19.1* 24.8*  MCV 94.3 91.8 90.8 89.8 89.6  PLT 200 264 407 419 430    Cardiac Enzymes: No results for input(s): CKTOTAL, CKMB, CKMBINDEX, TROPONINI in the last 168 hours.  BNP: Invalid input(s): POCBNP  CBG:  Recent Labs Lab 01/02/16 0742 01/02/16 1431 01/02/16 1658 01/02/16 2050 01/03/16 0731  GLUCAP 135* 125* 166* 168* 123*    Microbiology: Results for orders placed or performed during the hospital encounter of 12/25/15  Blood culture (routine x 2)     Status: Abnormal   Collection Time: 12/25/15  2:32 PM  Result Value Ref Range Status   Specimen Description BLOOD  L HAND  Final   Special Requests   Final    BOTTLES DRAWN AEROBIC AND ANAEROBIC  AER 12 ML ANA 14 ML   Culture  Setup Time   Final    GRAM NEGATIVE RODS IN BOTH AEROBIC AND ANAEROBIC BOTTLES CRITICAL VALUE NOTED.  VALUE IS CONSISTENT WITH PREVIOUSLY REPORTED AND CALLED VALUE. SDR    Culture (A)  Final    ENTEROBACTER CLOACAE  SUSCEPTIBILITIES PERFORMED ON PREVIOUS CULTURE WITHIN THE LAST 5 DAYS. Performed at [facility]    Report Status 12/30/2015 FINAL  Final  Blood culture (routine x 2)     Status: Abnormal   Collection Time: 12/25/15  2:32 PM  Result Value Ref Range Status   Specimen Description BLOOD  L FA  Final   Special Requests   Final    BOTTLES DRAWN AEROBIC AND ANAEROBIC  AER 12 ML ANA 12 ML   Culture  Setup Time   Final    GRAM NEGATIVE RODS IN BOTH AEROBIC AND ANAEROBIC BOTTLES CRITICAL RESULT CALLED TO, READ BACK BY AND VERIFIED WITH: NATE COOKSON AT 0535 12/26/15 SDR Performed at [facility]    Culture ENTEROBACTER CLOACAE (A)  Final   Report Status 12/30/2015 FINAL  Final   Organism ID, Bacteria ENTEROBACTER CLOACAE  Final      Susceptibility   Enterobacter cloacae  - MIC*    CEFAZOLIN >=64 RESISTANT Resistant     CEFEPIME <=1 SENSITIVE Sensitive     CEFTAZIDIME 16 INTERMEDIATE Intermediate     CEFTRIAXONE >=64 RESISTANT Resistant     CIPROFLOXACIN >=4 RESISTANT Resistant     GENTAMICIN <=1 SENSITIVE Sensitive     IMIPENEM <=0.25 SENSITIVE Sensitive     TRIMETH/SULFA <=20 SENSITIVE Sensitive     PIP/TAZO >=128 RESISTANT Resistant     * ENTEROBACTER CLOACAE  Blood Culture ID Panel (Reflexed)     Status: Abnormal   Collection Time: 12/25/15  2:32 PM  Result Value Ref Range Status   Enterococcus species NOT DETECTED NOT DETECTED Final   Listeria monocytogenes NOT DETECTED NOT DETECTED Final   Staphylococcus species NOT DETECTED NOT DETECTED Final   Staphylococcus aureus NOT DETECTED NOT DETECTED Final   Streptococcus species NOT DETECTED NOT DETECTED Final   Streptococcus agalactiae NOT DETECTED NOT DETECTED Final   Streptococcus pneumoniae NOT DETECTED NOT DETECTED Final   Streptococcus pyogenes NOT DETECTED NOT DETECTED Final   Acinetobacter baumannii NOT DETECTED NOT DETECTED Final   Enterobacteriaceae species DETECTED (A) NOT DETECTED Final    Comment: CRITICAL RESULT CALLED TO, READ BACK BY AND VERIFIED WITH:  NATE COOKSON AT 0535 12/26/15 SDR    Enterobacter cloacae complex DETECTED (A) NOT DETECTED Corrected    Comment: CRITICAL RESULT CALLED TO, READ BACK BY AND VERIFIED WITH: K MERRILL 12/28/15 @ 1210 M VESTAL CORRECTED ON 09/28 AT 1211: PREVIOUSLY REPORTED AS NOT DETECTED    Escherichia coli NOT DETECTED NOT DETECTED Corrected    Comment: PREVIOUSLY REPORTED AS: DETECTED CORRECTED RESULTS CALLED TO: K MERRILL 12/28/15 @ 1210 M VESTAL Performed at [facility] CORRECTED ON 09/28 AT 1211: PREVIOUSLY REPORTED AS DETECTED CRITICAL RESULT CALLED TO, READ BACK BY AND VERIFIED WITH:  NATE COOKSON AT 0535 12/26/15 SDR    Klebsiella oxytoca NOT DETECTED NOT DETECTED Final   Klebsiella pneumoniae NOT DETECTED NOT DETECTED Final    Proteus species NOT DETECTED NOT DETECTED Final   Serratia marcescens NOT DETECTED NOT DETECTED Final   Carbapenem resistance NOT DETECTED NOT DETECTED Final   Haemophilus influenzae NOT DETECTED NOT DETECTED Final   Neisseria meningitidis NOT DETECTED NOT DETECTED Final   Pseudomonas aeruginosa NOT DETECTED NOT DETECTED Final   Candida albicans NOT DETECTED NOT DETECTED Final   Candida glabrata NOT DETECTED NOT DETECTED Final   Candida krusei NOT DETECTED NOT DETECTED Final   Candida parapsilosis NOT DETECTED NOT DETECTED Final   Candida tropicalis NOT DETECTED NOT DETECTED Final  Culture, body fluid-bottle     Status: None   Collection Time: 12/25/15  4:00 PM  Result Value Ref Range Status   Specimen Description FLUID PERITONEAL DIALYSATE  Final   Special Requests BOTTLES DRAWN AEROBIC AND ANAEROBIC 10CC  Final   Culture   Final    NO GROWTH 5 DAYS Performed at [facility]    Report Status 12/30/2015 FINAL  Final  Gram stain     Status: None   Collection Time: 12/25/15  4:00 PM  Result Value Ref Range Status   Specimen Description FLUID PERITONEAL DIALYSATE  Final   Special Requests NONE  Final   Gram Stain   Final    FEW WBC PRESENT,BOTH PMN AND MONONUCLEAR NO ORGANISMS SEEN Performed at [facility]    Report Status 12/27/2015 FINAL  Final  MRSA PCR Screening     Status: Abnormal   Collection Time: 12/26/15  1:34 PM  Result Value Ref Range Status   MRSA by PCR POSITIVE (A) NEGATIVE Final    Comment:        The GeneXpert MRSA Assay (FDA approved for NASAL specimens only), is one component of a comprehensive MRSA colonization surveillance program. It is not intended to diagnose MRSA infection nor to guide or monitor treatment for MRSA infections. RESULT CALLED TO, READ BACK BY AND VERIFIED WITH: AMY TODD RN AT 1455 12/26/15 MSS.   Culture, body fluid-bottle     Status: None   Collection Time: 12/27/15  4:15 PM  Result Value Ref Range Status    Specimen Description PERITONEAL DIALYSATE  Final   Special Requests NONE  Final   Culture   Final    NO GROWTH 5 DAYS Performed at [facility]    Report Status 01/01/2016 FINAL  Final  Gram stain     Status: None   Collection Time: 12/27/15  4:15 PM  Result Value Ref Range Status   Specimen Description PERITONEAL DIALYSATE  Final   Special Requests NONE  Final   Gram Stain   Final    WBC PRESENT,BOTH PMN AND MONONUCLEAR NO ORGANISMS SEEN CYTOSPIN Performed at [facility]    Report Status 12/27/2015 FINAL  Final  CULTURE, BLOOD (ROUTINE X 2) w Reflex to ID Panel     Status: None   Collection Time: 12/28/15  7:19 PM  Result Value Ref Range Status   Specimen Description BLOOD RIGHT AC  Final   Special Requests BOTTLES DRAWN AEROBIC AND ANAEROBIC 10 ML  Final   Culture NO GROWTH 5 DAYS  Final   Report Status 01/02/2016 FINAL  Final    Coagulation Studies: No results for input(s): LABPROT, INR in the last 72 hours.  Urinalysis: No results for input(s): COLORURINE, LABSPEC, PHURINE, GLUCOSEU, HGBUR, BILIRUBINUR, KETONESUR, PROTEINUR, UROBILINOGEN, NITRITE, LEUKOCYTESUR in the last 72 hours.  Invalid input(s): APPERANCEUR    Imaging: Dg Chest 1 View  Result Date: 01/02/2016 CLINICAL DATA:  Status post dialysis catheter placement today. EXAM: CHEST 1 VIEW COMPARISON:  Single-view of the chest 12/25/2015. FINDINGS: New left IJ approach dialysis catheter is in place with the tip projecting at the superior cavoatrial junction. No pneumothorax. Right IJ dialysis catheter present on the prior exam has been removed. Lungs are clear. Heart size is normal. No pleural fluid. IMPRESSION: Tip of new dialysis catheter projects at the superior cavoatrial junction. Negative for pneumothorax. Electronically Signed   By: Drusilla Kanner M.D.   On: 01/02/2016 10:27   Dg Abd 1  View  Result Date: 01/02/2016 CLINICAL DATA:  50 year old female with abdominal pain and constipation.  EXAM: ABDOMEN - 1 VIEW COMPARISON:  Radiograph dated 07/31/2015 FINDINGS: Large amount of stool noted throughout the visualized colon. Dense stool noted in the rectosigmoid. No bowel dilatation or evidence of obstruction. There is no free air or radiopaque calculi. The soft tissues and osseous structures appear unremarkable. IMPRESSION: Constipation.  No bowel obstruction. Electronically Signed   By: Elgie CollardArash  Radparvar M.D.   On: 01/02/2016 20:03     Medications:     . sodium chloride   Intravenous Once  . calcium acetate  1,334 mg Oral TID WC  . calcium carbonate  1 tablet Oral TID WC  . clopidogrel  75 mg Oral Daily  . colesevelam  625 mg Oral BID WC  . dialysis solution 2.5% low-MG/low-CA   Intraperitoneal Q24H  . docusate sodium  100 mg Oral QHS  . epoetin (EPOGEN/PROCRIT) injection  20,000 Units Subcutaneous Weekly  . feeding supplement (PRO-STAT SUGAR FREE 64)  30 mL Oral TID WC  . gentamicin cream  1 application Topical Daily  . heparin subcutaneous  5,000 Units Subcutaneous Q8H  . hydrALAZINE  25 mg Oral Daily  . insulin aspart  0-10 Units Subcutaneous TID WC  . insulin glargine  8 Units Subcutaneous QHS  . isosorbide mononitrate  60 mg Oral Daily  . lactulose  10 g Oral Daily  . levETIRAcetam  500 mg Oral q morning - 10a  . lisinopril  40 mg Oral Daily  . meropenem (MERREM) IV  1 g Intravenous Q24H  . metaxalone  800 mg Oral BID  . metoprolol  50 mg Oral BID  . mupirocin ointment  1 application Nasal BID  . nicotine  14 mg Transdermal Daily  . nystatin  1 g Topical BID  . pantoprazole  40 mg Oral Daily  . polyethylene glycol  17 g Oral Daily  . ranolazine  500 mg Oral BID  . senna  2 tablet Oral BID  . sodium chloride flush  3 mL Intravenous Q12H  . torsemide  100 mg Oral Daily  . white petrolatum   Topical Q72H   acetaminophen **OR** acetaminophen, bisacodyl, lactulose, metolazone, metoprolol, morphine injection, nitroGLYCERIN, ondansetron **OR** ondansetron (ZOFRAN)  IV, oxyCODONE-acetaminophen  Assessment/ Plan:  50 y.o. white female with end-stage renal disease on hemodialysis, asthma, gastroparesis, diabetes mellitus type II, congestive heart failure, hypertension, endometriosis, coronary atherosclerosis  [facility]/ [facility] [address]/ TTS   1. End-stage renal disease on hemodialysis dialysis: tolerated hemodialysis treatment yesterday. Treatment for tomorrow.   2. Anemia of CKD: PRBC transfusion 1 unit 10/3 - EPO with treatment.   3. Sepsis with bactermia: 9/25 blood cultures positive for enterobacter. Tunneled catheter removed. Aztreonam given on 9/25. Levofloxacin ordered from 9/26-10/1, Meropenem ordered from 9/30 to 104 - appreciate ID input  4. Secondary Hyperparathyroidism:  Patient taken off of calcitriol as PTH low and hypercalcemia. Phosphorus at goal.  Calcium high at 10.9  - calcium acetate  reduced to 2 tabs with meals.  - Continue to monitor calcium levels.    LOS: 7 Kimberly Montgomery 10/4/201710:47 AM

## 2016-01-03 NOTE — Care Management Important Message (Signed)
Important Message  Patient Details  Name: Kimberly Montgomery MRN: [id number] Date of Birth: 22-Jul-1965   Medicare Important Message Given:  Yes    Eber Hong, RN 01/03/2016, 3:40 PM

## 2016-01-03 NOTE — Progress Notes (Addendum)
  SUBJECTIVE: Kimberly Montgomery is alert and oriented without any chest pain or shortness of breath this morning. She continues to improve on antibiotic therapy.   Vitals:   01/02/16 1431 01/02/16 1433 01/02/16 1945 01/03/16 0410  BP:  (!) 138/56 (!) 138/57 (!) 157/48  Pulse:  (!) 59 66 63  Resp:   16 16  Temp:  98.5 F (36.9 C) 98 F (36.7 C) 97.8 F (36.6 C)  TempSrc:  Oral Oral Oral  SpO2:  100% 100% 100%  Weight: 256 lb 2.8 oz (116.2 kg)   255 lb 15.3 oz (116.1 kg)  Height:        Intake/Output Summary (Last 24 hours) at 01/03/16 0910 Last data filed at 01/03/16 0700  Gross per 24 hour  Intake              200 ml  Output             1400 ml  Net            -1200 ml    LABS: Basic Metabolic Panel:  Recent Labs  32/44/01 1041  NA 130*  K 4.6  CL 92*  CO2 32  GLUCOSE 143*  BUN 71*  CREATININE 6.94*  CALCIUM 10.9*  PHOS 4.4   Liver Function Tests:  Recent Labs  01/02/16 1041  ALBUMIN 1.8*   No results for input(s): LIPASE, AMYLASE in the last 72 hours. CBC:  Recent Labs  01/02/16 1041 01/03/16 0424  WBC 8.4 11.5*  HGB 6.5* 8.3*  HCT 19.1* 24.8*  MCV 89.8 89.6  PLT 419 430   Cardiac Enzymes: No results for input(s): CKTOTAL, CKMB, CKMBINDEX, TROPONINI in the last 72 hours. BNP: Invalid input(s): POCBNP D-Dimer: No results for input(s): DDIMER in the last 72 hours. Hemoglobin A1C: No results for input(s): HGBA1C in the last 72 hours. Fasting Lipid Panel: No results for input(s): CHOL, HDL, LDLCALC, TRIG, CHOLHDL, LDLDIRECT in the last 72 hours. Thyroid Function Tests: No results for input(s): TSH, T4TOTAL, T3FREE, THYROIDAB in the last 72 hours.  Invalid input(s): FREET3 Anemia Panel: No results for input(s): VITAMINB12, FOLATE, FERRITIN, TIBC, IRON, RETICCTPCT in the last 72 hours.   PHYSICAL EXAM General: Well developed, well nourished, in no acute distress HEENT:  Normocephalic and atramatic Neck:  No JVD.  Lungs: Clear bilaterally to  auscultation and percussion. Heart: HRRR . Normal S1 and S2 without gallops or murmurs.  Abdomen: Bowel sounds are positive, abdomen soft and non-tender  Msk:  Right lower extremity amputation Extremities: No clubbing, cyanosis or edema.   Neuro: Alert and oriented X 3. Psych:  Good affect, responds appropriately  TELEMETRY: Sinus rhythm in the 70s  ASSESSMENT AND PLAN: Mildly elevated troponin and chest pain related to bacteremia. No further chest pain and is improving on antibiotic therapy. Has a new permacath and is anticipating discharge to ask in place in a day or 2. Will follow-up in the office.  Active Problems:   Chest pain   Bacteremia    Berton Bon, NP 01/03/2016 9:10 AM

## 2016-01-04 LAB — RENAL FUNCTION PANEL
ALBUMIN: 1.9 g/dL — AB (ref 3.5–5.0)
ANION GAP: 6 (ref 5–15)
ANION GAP: 10 (ref 5–15)
Albumin: 1.9 g/dL — ABNORMAL LOW (ref 3.5–5.0)
BUN: 48 mg/dL — ABNORMAL HIGH (ref 6–20)
BUN: 51 mg/dL — ABNORMAL HIGH (ref 6–20)
CALCIUM: 10.5 mg/dL — AB (ref 8.9–10.3)
CHLORIDE: 94 mmol/L — AB (ref 101–111)
CO2: 32 mmol/L (ref 22–32)
CO2: 29 mmol/L (ref 22–32)
Calcium: 10.5 mg/dL — ABNORMAL HIGH (ref 8.9–10.3)
Chloride: 91 mmol/L — ABNORMAL LOW (ref 101–111)
Creatinine, Ser: 5.52 mg/dL — ABNORMAL HIGH (ref 0.44–1.00)
Creatinine, Ser: 5.58 mg/dL — ABNORMAL HIGH (ref 0.44–1.00)
GFR calc Af Amer: 9 mL/min — ABNORMAL LOW (ref >60)
GFR calc non Af Amer: 8 mL/min — ABNORMAL LOW (ref >60)
GFR, EST AFRICAN AMERICAN: 9 mL/min — AB (ref >60)
GFR, EST NON AFRICAN AMERICAN: 8 mL/min — AB (ref >60)
GLUCOSE: 118 mg/dL — AB (ref 65–99)
Glucose, Bld: 147 mg/dL — ABNORMAL HIGH (ref 65–99)
PHOSPHORUS: 3.5 mg/dL (ref 2.5–4.6)
POTASSIUM: 3.7 mmol/L (ref 3.5–5.1)
POTASSIUM: 3.9 mmol/L (ref 3.5–5.1)
Phosphorus: 3.3 mg/dL (ref 2.5–4.6)
SODIUM: 130 mmol/L — AB (ref 135–145)
Sodium: 132 mmol/L — ABNORMAL LOW (ref 135–145)

## 2016-01-04 LAB — CBC
HEMATOCRIT: 23.8 % — AB (ref 35.0–47.0)
HEMOGLOBIN: 7.9 g/dL — AB (ref 12.0–16.0)
MCH: 29.7 pg (ref 26.0–34.0)
MCHC: 33.2 g/dL (ref 32.0–36.0)
MCV: 89.5 fL (ref 80.0–100.0)
Platelets: 496 10*3/uL — ABNORMAL HIGH (ref 150–440)
RBC: 2.66 MIL/uL — ABNORMAL LOW (ref 3.80–5.20)
RDW: 15.2 % — AB (ref 11.5–14.5)
WBC: 12.4 10*3/uL — AB (ref 3.6–11.0)

## 2016-01-04 LAB — GLUCOSE, CAPILLARY
GLUCOSE-CAPILLARY: 133 mg/dL — AB (ref 65–99)
Glucose-Capillary: 134 mg/dL — ABNORMAL HIGH (ref 65–99)

## 2016-01-04 MED ORDER — EPOETIN ALFA 10000 UNIT/ML IJ SOLN
10000.0000 [IU] | INTRAMUSCULAR | Status: DC
  Administered 2016-01-04 (×2): 10000 [IU] via INTRAVENOUS
  Filled 2016-01-04: qty 1

## 2016-01-04 NOTE — Progress Notes (Signed)
Pre dialysis  

## 2016-01-04 NOTE — Progress Notes (Signed)
Dialysis complete

## 2016-01-04 NOTE — Progress Notes (Signed)
Pre Dialysis 

## 2016-01-04 NOTE — Clinical Social Work Note (Addendum)
MSW received phone call from [facility] who said they are denying patient for short term rehab benefits.  Kimberly Montgomery has accepted patient as a long term care medicaid patient.  Patient to be d/c'ed today to [facility].  Patient and family agreeable to plans will transport via ems RN to call report to [phone].  Windell Moulding, MSW Mon-Fri 8a-4:30p [phone]

## 2016-01-04 NOTE — Progress Notes (Signed)
Pt being discharged to [facility]. Facility called and report given to Moore. RN explained discharge instructions to pt and pt signed discharge summary. EMS called for transport of pt. Upon arrival of EMS, pt moved to stretcher and report given to crew. Pt discharged with EMS to [facility] without incident.

## 2016-01-04 NOTE — Progress Notes (Signed)
Dialysis started 

## 2016-01-04 NOTE — Discharge Summary (Signed)
Kimberly AristaLaura L Eppinger, is a 50 y.o. female  DOB 10/24/1965  MRN [id number].  Admission date:  12/25/2015  Admitting Physician  Houston SirenVivek J Sainani, MD  Discharge Date:  01/04/2016   Primary MD  Jarome MatinALE, PATRICK D, MD  Recommendations for primary care physician for things to follow:      Admission Diagnosis  NSTEMI (non-ST elevated myocardial infarction) (HCC) [I21.4] Fever, unspecified fever cause [R50.9] Chest pain, unspecified chest pain type [R07.9]   Discharge Diagnosis  NSTEMI (non-ST elevated myocardial infarction) (HCC) [I21.4] Fever, unspecified fever cause [R50.9] Chest pain, unspecified chest pain type [R07.9]    Active Problems:   Chest pain   Bacteremia      Past Medical History:  Diagnosis Date  . Asthma   . CAD (coronary artery disease)   . CHF (congestive heart failure) (HCC)   . Diabetes mellitus without complication (HCC)   . Endometriosis   . ESRD (end stage renal disease) (HCC)   . Gastroparesis   . GERD (gastroesophageal reflux disease)   . HLD (hyperlipidemia)   . Hypertension   . Renal disorder     Past Surgical History:  Procedure Laterality Date  . BELOW KNEE LEG AMPUTATION    . CARDIAC CATHETERIZATION Right 07/10/2015   Procedure: Left Heart Cath and Coronary Angiography;  Surgeon: Laurier NancyShaukat A Khan, MD;  Location: [facility] INVASIVE CV LAB;  Service: Cardiovascular;  Laterality: Right;  . CARDIAC CATHETERIZATION Right 09/14/2015   Procedure: Left Heart Cath and Coronary Angiography;  Surgeon: Laurier NancyShaukat A Khan, MD;  Location: [facility] INVASIVE CV LAB;  Service: Cardiovascular;  Laterality: Right;  . CARDIAC CATHETERIZATION N/A 09/14/2015   Procedure: Coronary Stent Intervention;  Surgeon: Alwyn Peawayne D Callwood, MD;  Location: [facility] INVASIVE CV LAB;  Service: Cardiovascular;  Laterality: N/A;  . CARDIAC  CATHETERIZATION Right 11/20/2015   Procedure: Left Heart Cath and Coronary Angiography;  Surgeon: Laurier NancyShaukat A Khan, MD;  Location: [facility] INVASIVE CV LAB;  Service: Cardiovascular;  Laterality: Right;  . hd fistula surgery with reversal    . HYSTEROTOMY    . pd tube inserstion    . PERIPHERAL VASCULAR CATHETERIZATION N/A 08/30/2015   Procedure: Dialysis/Perma Catheter Insertion;  Surgeon: Annice NeedyJason S Dew, MD;  Location: [facility] INVASIVE CV LAB;  Service: Cardiovascular;  Laterality: N/A;  . PERIPHERAL VASCULAR CATHETERIZATION N/A 01/01/2016   Procedure: Dialysis/Perma Catheter Insertion;  Surgeon: Annice NeedyJason S Dew, MD;  Location: [facility] INVASIVE CV LAB;  Service: Cardiovascular;  Laterality: N/A;       History of present illness and  Hospital Course:     Kindly see H&P for history of present illness and admission details, please review complete Labs, Consult reports and Test reports for all details in brief  HPI  from the history and physical done on the day of admission 50 year old female patient with history of multiple medical problems of ESRD on HD,  Diabetes mellitus type 2,peripheral vascular disease,bedboundGeneralized deconditioning and poor motivation to get up Comes because of chest pain.did not have any nausea, vomiting, diaphoresis. Admitted for observation for chest pain evaluation.  Hospital Course  ! CHEST PAIN WITH MILDLY ELEVATED TROPON WITH HISTORY OF CAD, PCI with stent with cardiac catheter in 1 month ago with no restenosis ,/cardiology recommended no further workup.patient chest pain resolved. 2. Enterobacter bacteremia:seen by ID, perma cath that she has on the right that removed. Peritoneal fluid cultures are negative. . Follow blood cultures from 10/3  are negative. Patient is allergic to penicillin causing anaphylaxis. Received Meropenam. patient  is seen by vascular,is a new permacath placed in left IJ,started on HD thru this.plan  To discharge her after her HD today. Plan to discharge  her to [facility]  #3/chronic pain issues:requesting Percocet, very poor motivation to work with physical therapy and like to stay in the bed. #4 ESRD on hemodialysis; Tuesday Thursday Saturday. #5anemia of chronic kidney disease: Received 1 unit of transfusion. epogen with the hemodialysis. 6 history of seizures: Continue Keppra #7 diabetes mellitus type 2 without complications: Continue Lantus, SSI with #8 GERD continue PPIs #9 constipation patient on multiple stool softeners.  Discharge Condition: stable   Follow UP  Contact information for after-discharge care    Destination    [facility] .   Specialty:  Skilled Nursing Facility Contact information: [address] [place] [address] [phone]                Discharge Instructions  and  Discharge Medications        Medication List    STOP taking these medications   erythromycin 250 MG tablet Commonly known as:  E-MYCIN   metolazone 5 MG tablet Commonly known as:  ZAROXOLYN     TAKE these medications   bisacodyl 10 MG suppository Commonly known as:  DULCOLAX Place 10 mg rectally daily as needed for moderate constipation. Use if no relief from Milk of Magnesia   calcitRIOL 0.25 MCG capsule Commonly known as:  ROCALTROL Take 0.25 mcg by mouth daily.   calcium acetate 667 MG capsule Commonly known as:  PHOSLO Take 3 capsules (2,001 mg total) by mouth 3 (three) times daily with meals.   calcium carbonate 500 MG chewable tablet Commonly known as:  TUMS - dosed in mg elemental calcium Chew 1 tablet (200 mg of elemental calcium total) by mouth 3 (three) times daily with meals.   clopidogrel 75 MG tablet Commonly known as:  PLAVIX Take 75 mg by mouth daily.   colesevelam 625 MG tablet Commonly known as:  WELCHOL Take 1 tablet (625 mg total) by mouth 2 (two) times daily with a meal.   docusate sodium 100 MG capsule Commonly known as:  COLACE Take 100 mg by mouth  at bedtime.   feeding supplement (PRO-STAT SUGAR FREE 64) Liqd Take 30 mLs by mouth 3 (three) times daily with meals.   hydrALAZINE 25 MG tablet Commonly known as:  APRESOLINE Take 25 mg by mouth daily.   insulin aspart 100 UNIT/ML injection Commonly known as:  novoLOG Inject 0-10 Units into the skin 3 (three) times daily with meals. 0-150=0units 151-200=2units 201-250=4units 251-300=6units 301-350=8units 351-400=10 units   NOVOLOG FLEXPEN 100 UNIT/ML FlexPen Generic drug:  insulin aspart Inject 0-10 Units into the skin 3 (three) times daily with meals. Inject per sliding scale: If 0-150 = 0    151-200 = 2 units   201-250 = 4 units   251-300 = 6 units    301-350 = 8 units    351-400 = 10 units  Anything above 400, call MD   insulin glargine 100 UNIT/ML injection Commonly known as:  LANTUS Inject 0.08 mLs (8 Units total) into the skin at bedtime.   isosorbide mononitrate 60 MG 24 hr tablet Commonly known as:  IMDUR Take 1 tablet (60 mg total) by mouth daily.   lactulose 10 GM/15ML solution Commonly known as:  CHRONULAC Take 10 g by mouth daily.   levETIRAcetam 500 MG tablet Commonly known as:  KEPPRA Take 1 tablet (500 mg total)  by mouth every morning.   lisinopril 40 MG tablet Commonly known as:  PRINIVIL,ZESTRIL Take 40 mg by mouth daily.   metoCLOPramide 10 MG tablet Commonly known as:  REGLAN Take 1 tablet (10 mg total) by mouth 4 (four) times daily.   metoprolol 50 MG tablet Commonly known as:  LOPRESSOR Take 1 tablet (50 mg total) by mouth 2 (two) times daily.   mupirocin ointment 2 % Commonly known as:  BACTROBAN Place 1 application into the nose 2 (two) times daily.   nicotine 14 mg/24hr patch Commonly known as:  NICODERM CQ - dosed in mg/24 hours Place 14 mg onto the skin daily.   nitroGLYCERIN 0.4 MG SL tablet Commonly known as:  NITROSTAT Place 0.4 mg under the tongue every 5 (five) minutes as needed for chest pain. Reported on 07/10/2015   nystatin  powder Commonly known as:  MYCOSTATIN/NYSTOP Apply 1 g topically 2 (two) times daily.   omeprazole 20 MG capsule Commonly known as:  PRILOSEC Take 40 mg by mouth 2 (two) times daily before a meal.   ondansetron 4 MG disintegrating tablet Commonly known as:  ZOFRAN ODT Take 1 tablet (4 mg total) by mouth every 6 (six) hours as needed for nausea or vomiting.   oxyCODONE 5 MG immediate release tablet Commonly known as:  Oxy IR/ROXICODONE Take 1 tablet (5 mg total) by mouth every 6 (six) hours as needed for severe pain.   oxyCODONE-acetaminophen 5-325 MG tablet Commonly known as:  PERCOCET/ROXICET Take 1 tablet by mouth every 6 (six) hours as needed for moderate pain.   polyethylene glycol packet Commonly known as:  MIRALAX / GLYCOLAX Take 17 g by mouth daily.   ranolazine 500 MG 12 hr tablet Commonly known as:  RANEXA Take 1 tablet (500 mg total) by mouth 2 (two) times daily.   risperiDONE 1 MG tablet Commonly known as:  RISPERDAL Take 1 mg by mouth 3 (three) times daily.   senna 8.6 MG Tabs tablet Commonly known as:  SENOKOT Take 2 tablets by mouth 2 (two) times daily.   torsemide 100 MG tablet Commonly known as:  DEMADEX Take 100 mg by mouth daily.   XEROFORM PETROLATUM DRESSING Pads Apply 1 each topically every 3 (three) days.         Diet and Activity recommendation: See Discharge Instructions above   Consults obtained -cardio,nephro,vascular,PT   Major procedures and Radiology Reports - PLEASE review detailed and final reports for all details, in brief -     Dg Chest 1 View  Result Date: 01/02/2016 CLINICAL DATA:  Status post dialysis catheter placement today. EXAM: CHEST 1 VIEW COMPARISON:  Single-view of the chest 12/25/2015. FINDINGS: New left IJ approach dialysis catheter is in place with the tip projecting at the superior cavoatrial junction. No pneumothorax. Right IJ dialysis catheter present on the prior exam has been removed. Lungs are clear.  Heart size is normal. No pleural fluid. IMPRESSION: Tip of new dialysis catheter projects at the superior cavoatrial junction. Negative for pneumothorax. Electronically Signed   By: Drusilla Kanner M.D.   On: 01/02/2016 10:27   Dg Chest 1 View  Result Date: 12/07/2015 CLINICAL DATA:  Chest pain during dialysis EXAM: CHEST 1 VIEW COMPARISON:  12/05/2015 FINDINGS: Cardiac shadow is enlarged but stable from the prior exam. A right jugular dialysis catheter is again seen and stable. The lungs are clear bilaterally. No acute bony abnormality is seen. IMPRESSION: No active disease. Electronically Signed   By: Eulah Pont.D.  On: 12/07/2015 08:27   Dg Chest 2 View  Result Date: 12/24/2015 CLINICAL DATA:  Chest pain this morning. Previous coronary artery stent placement. EXAM: CHEST  2 VIEW COMPARISON:  12/07/2015. FINDINGS: Stable enlarged cardiac silhouette. The right jugular catheter tip remains in the superior vena cava. The interstitial markings are minimally prominent. Otherwise, clear lungs. Small to moderate-sized pleural effusion posteriorly, most likely on the left. Aortic arch calcification. Mild thoracic spine degenerative changes. Minimal bilateral shoulder degenerative changes. IMPRESSION: 1. Small to moderate size left pleural effusion posteriorly at the lung bases. 2. Mild aortic atherosclerosis. 3. Minimal chronic interstitial lung disease. Electronically Signed   By: Beckie Salts M.D.   On: 12/24/2015 08:45   Dg Chest 2 View  Result Date: 12/05/2015 CLINICAL DATA:  50 year old female with chest pain concerning for pneumonia. EXAM: CHEST  2 VIEW COMPARISON:  Chest radiograph dated 11/20/2015 FINDINGS: There is a small left pleural effusion, decreased in size compared to prior study. There is associated mild compressive atelectasis of the left lung base. Superimposed pneumonia is not excluded. Clinical correlation is recommended. The right lung is clear. There is no pneumothorax. Right IJ  dialysis catheter remains in stable positioning. There is stable mild cardiomegaly. No acute osseous pathology. IMPRESSION: Interval decrease in the size of the left pleural effusion. Stable mild cardiomegaly. Electronically Signed   By: Elgie Collard M.D.   On: 12/05/2015 18:52   Dg Abd 1 View  Result Date: 01/02/2016 CLINICAL DATA:  50 year old female with abdominal pain and constipation. EXAM: ABDOMEN - 1 VIEW COMPARISON:  Radiograph dated 07/31/2015 FINDINGS: Large amount of stool noted throughout the visualized colon. Dense stool noted in the rectosigmoid. No bowel dilatation or evidence of obstruction. There is no free air or radiopaque calculi. The soft tissues and osseous structures appear unremarkable. IMPRESSION: Constipation.  No bowel obstruction. Electronically Signed   By: Elgie Collard M.D.   On: 01/02/2016 20:03   Ct Head Wo Contrast  Result Date: 12/30/2015 CLINICAL DATA:  Altered mental status EXAM: CT HEAD WITHOUT CONTRAST TECHNIQUE: Contiguous axial images were obtained from the base of the skull through the vertex without intravenous contrast. COMPARISON:  07/21/2015 FINDINGS: Brain: Mild global atrophy. Chronic ischemic changes in the periventricular white matter. No mass effect, midline shift, or acute hemorrhage. Vascular: Carotid siphon calcifications Skull: Intact Sinuses/Orbits: Mucosal thickening in the left maxillary sinus. Other: Noncontributory IMPRESSION: No acute intracranial pathology.  Chronic changes. Electronically Signed   By: Jolaine Click M.D.   On: 12/30/2015 14:31   US Venous Img Upper Uni Left  Result Date: 12/07/2015 CLINICAL DATA:  Left arm edema for 3 days EXAM: LEFT UPPER EXTREMITY VENOUS DUPLEX ULTRASOUND TECHNIQUE: Doppler venous assessment of the left lower extremity deep venous system was performed, including characterization of spectral flow, compressibility, and phasicity. COMPARISON:  None. FINDINGS: There is complete compressibility of the left  jugular, subclavian, axillary, and brachial veins. Radial vein is compressible. Ulnar vein is poorly visualized but grossly compressible. There is no obvious cephalic or basilic vein thrombosis. Doppler analysis demonstrates a normal-appearing venous waveform. There is also augmentation with wrist compression. IMPRESSION: No evidence of left upper extremity DVT. No evidence of left jugular vein DVT. Electronically Signed   By: Jolaine Click M.D.   On: 12/07/2015 09:28   Dg Chest Portable 1 View  Result Date: 12/25/2015 CLINICAL DATA:  Chest pain since last night. EXAM: PORTABLE CHEST 1 VIEW COMPARISON:  December 24, 2015 FINDINGS: The heart size and mediastinal contours  are stable. Right central venous sinus unchanged distal tip in superior vena cava. There is no focal infiltrate, pulmonary edema, or pleural effusion. The visualized skeletal structures are stable. IMPRESSION: No active cardiopulmonary disease. Electronically Signed   By: Sherian Rein M.D.   On: 12/25/2015 15:54    Micro Results    Recent Results (from the past 240 hour(s))  Blood culture (routine x 2)     Status: Abnormal   Collection Time: 12/25/15  2:32 PM  Result Value Ref Range Status   Specimen Description BLOOD  L HAND  Final   Special Requests   Final    BOTTLES DRAWN AEROBIC AND ANAEROBIC  AER 12 ML ANA 14 ML   Culture  Setup Time   Final    GRAM NEGATIVE RODS IN BOTH AEROBIC AND ANAEROBIC BOTTLES CRITICAL VALUE NOTED.  VALUE IS CONSISTENT WITH PREVIOUSLY REPORTED AND CALLED VALUE. SDR    Culture (A)  Final    ENTEROBACTER CLOACAE SUSCEPTIBILITIES PERFORMED ON PREVIOUS CULTURE WITHIN THE LAST 5 DAYS. Performed at [facility]    Report Status 12/30/2015 FINAL  Final  Blood culture (routine x 2)     Status: Abnormal   Collection Time: 12/25/15  2:32 PM  Result Value Ref Range Status   Specimen Description BLOOD  L FA  Final   Special Requests   Final    BOTTLES DRAWN AEROBIC AND ANAEROBIC  AER 12 ML  ANA 12 ML   Culture  Setup Time   Final    GRAM NEGATIVE RODS IN BOTH AEROBIC AND ANAEROBIC BOTTLES CRITICAL RESULT CALLED TO, READ BACK BY AND VERIFIED WITH: NATE COOKSON AT 0535 12/26/15 SDR Performed at [facility]    Culture ENTEROBACTER CLOACAE (A)  Final   Report Status 12/30/2015 FINAL  Final   Organism ID, Bacteria ENTEROBACTER CLOACAE  Final      Susceptibility   Enterobacter cloacae - MIC*    CEFAZOLIN >=64 RESISTANT Resistant     CEFEPIME <=1 SENSITIVE Sensitive     CEFTAZIDIME 16 INTERMEDIATE Intermediate     CEFTRIAXONE >=64 RESISTANT Resistant     CIPROFLOXACIN >=4 RESISTANT Resistant     GENTAMICIN <=1 SENSITIVE Sensitive     IMIPENEM <=0.25 SENSITIVE Sensitive     TRIMETH/SULFA <=20 SENSITIVE Sensitive     PIP/TAZO >=128 RESISTANT Resistant     * ENTEROBACTER CLOACAE  Blood Culture ID Panel (Reflexed)     Status: Abnormal   Collection Time: 12/25/15  2:32 PM  Result Value Ref Range Status   Enterococcus species NOT DETECTED NOT DETECTED Final   Listeria monocytogenes NOT DETECTED NOT DETECTED Final   Staphylococcus species NOT DETECTED NOT DETECTED Final   Staphylococcus aureus NOT DETECTED NOT DETECTED Final   Streptococcus species NOT DETECTED NOT DETECTED Final   Streptococcus agalactiae NOT DETECTED NOT DETECTED Final   Streptococcus pneumoniae NOT DETECTED NOT DETECTED Final   Streptococcus pyogenes NOT DETECTED NOT DETECTED Final   Acinetobacter baumannii NOT DETECTED NOT DETECTED Final   Enterobacteriaceae species DETECTED (A) NOT DETECTED Final    Comment: CRITICAL RESULT CALLED TO, READ BACK BY AND VERIFIED WITH:  NATE COOKSON AT 0535 12/26/15 SDR    Enterobacter cloacae complex DETECTED (A) NOT DETECTED Corrected    Comment: CRITICAL RESULT CALLED TO, READ BACK BY AND VERIFIED WITH: K MERRILL 12/28/15 @ 1210 M VESTAL CORRECTED ON 09/28 AT 1211: PREVIOUSLY REPORTED AS NOT DETECTED    Escherichia coli NOT DETECTED NOT DETECTED Corrected  Comment: PREVIOUSLY REPORTED AS: DETECTED CORRECTED RESULTS CALLED TO: K MERRILL 12/28/15 @ 1210 M VESTAL Performed at [facility] CORRECTED ON 09/28 AT 1211: PREVIOUSLY REPORTED AS DETECTED CRITICAL RESULT CALLED TO, READ BACK BY AND VERIFIED WITH:  NATE COOKSON AT 0535 12/26/15 SDR    Klebsiella oxytoca NOT DETECTED NOT DETECTED Final   Klebsiella pneumoniae NOT DETECTED NOT DETECTED Final   Proteus species NOT DETECTED NOT DETECTED Final   Serratia marcescens NOT DETECTED NOT DETECTED Final   Carbapenem resistance NOT DETECTED NOT DETECTED Final   Haemophilus influenzae NOT DETECTED NOT DETECTED Final   Neisseria meningitidis NOT DETECTED NOT DETECTED Final   Pseudomonas aeruginosa NOT DETECTED NOT DETECTED Final   Candida albicans NOT DETECTED NOT DETECTED Final   Candida glabrata NOT DETECTED NOT DETECTED Final   Candida krusei NOT DETECTED NOT DETECTED Final   Candida parapsilosis NOT DETECTED NOT DETECTED Final   Candida tropicalis NOT DETECTED NOT DETECTED Final  Culture, body fluid-bottle     Status: None   Collection Time: 12/25/15  4:00 PM  Result Value Ref Range Status   Specimen Description FLUID PERITONEAL DIALYSATE  Final   Special Requests BOTTLES DRAWN AEROBIC AND ANAEROBIC 10CC  Final   Culture   Final    NO GROWTH 5 DAYS Performed at [facility]    Report Status 12/30/2015 FINAL  Final  Gram stain     Status: None   Collection Time: 12/25/15  4:00 PM  Result Value Ref Range Status   Specimen Description FLUID PERITONEAL DIALYSATE  Final   Special Requests NONE  Final   Gram Stain   Final    FEW WBC PRESENT,BOTH PMN AND MONONUCLEAR NO ORGANISMS SEEN Performed at [facility]    Report Status 12/27/2015 FINAL  Final  MRSA PCR Screening     Status: Abnormal   Collection Time: 12/26/15  1:34 PM  Result Value Ref Range Status   MRSA by PCR POSITIVE (A) NEGATIVE Final    Comment:        The GeneXpert MRSA Assay (FDA approved for NASAL  specimens only), is one component of a comprehensive MRSA colonization surveillance program. It is not intended to diagnose MRSA infection nor to guide or monitor treatment for MRSA infections. RESULT CALLED TO, READ BACK BY AND VERIFIED WITH: AMY TODD RN AT 1455 12/26/15 MSS.   Culture, body fluid-bottle     Status: None   Collection Time: 12/27/15  4:15 PM  Result Value Ref Range Status   Specimen Description PERITONEAL DIALYSATE  Final   Special Requests NONE  Final   Culture   Final    NO GROWTH 5 DAYS [facility]    Report Status 01/01/2016 FINAL  Final  Gram stain     Status: None   Collection Time: 12/27/15  4:15 PM  Result Value Ref Range Status   Specimen Description PERITONEAL DIALYSATE  Final   Special Requests NONE  Final   Gram Stain   Final    WBC PRESENT,BOTH PMN AND MONONUCLEAR NO ORGANISMS SEEN CYTOSPIN Performed at [facility]    Report Status 12/27/2015 FINAL  Final  CULTURE, BLOOD (ROUTINE X 2) w Reflex to ID Panel     Status: None   Collection Time: 12/28/15  7:19 PM  Result Value Ref Range Status   Specimen Description BLOOD RIGHT AC  Final   Special Requests BOTTLES DRAWN AEROBIC AND ANAEROBIC 10 ML  Final  Culture NO GROWTH 5 DAYS  Final   Report Status 01/02/2016 FINAL  Final       Today   Subjective:   Melaine Mcphee today has no headache,no chest abdominal pain,no new weakness tingling or numbness, feels much better wants to go  To rehab today.   Objective:   Blood pressure (!) 151/55, pulse 70, temperature 98.5 F (36.9 C), temperature source Axillary, resp. rate 17, height 5\' 10"  (1.778 m), weight 118.5 kg (261 lb 4.6 oz), last menstrual period 03/16/2015, SpO2 100 %.   Intake/Output Summary (Last 24 hours) at 01/04/16 0944 Last data filed at 01/04/16 0943  Gross per 24 hour  Intake              200 ml  Output              900 ml  Net             -700 ml    Exam Awake Alert, Oriented x 3, No  new F.N deficits, Normal affect [place].AT,PERRAL Supple Neck,No JVD, No cervical lymphadenopathy appriciated.  Symmetrical Chest wall movement, Good air movement bilaterally, CTAB RRR,No Gallops,Rubs or new Murmurs, No Parasternal Heave +ve B.Sounds, Abd Soft, Non tender, No organomegaly appriciated, No rebound -guarding or rigidity. No Cyanosis, Clubbing or edema, No new Rash or bruise  Data Review   CBC w Diff: Lab Results  Component Value Date   WBC 11.5 (H) 01/03/2016   HGB 8.3 (L) 01/03/2016   HGB 11.5 (L) 07/17/2014   HCT 24.8 (L) 01/03/2016   HCT 35.2 07/17/2014   PLT 430 01/03/2016   PLT 316 07/17/2014   LYMPHOPCT 6 12/25/2015   LYMPHOPCT 29.3 07/17/2014   MONOPCT 6 12/25/2015   MONOPCT 5.5 07/17/2014   EOSPCT 0 12/25/2015   EOSPCT 1.2 07/17/2014   BASOPCT 0 12/25/2015   BASOPCT 0.7 07/17/2014    CMP: Lab Results  Component Value Date   NA 132 (L) 01/04/2016   NA 136 07/17/2014   K 3.7 01/04/2016   K 3.5 07/17/2014   CL 94 (L) 01/04/2016   CL 103 07/17/2014   CO2 32 01/04/2016   CO2 26 07/17/2014   BUN 48 (H) 01/04/2016   BUN 32 (H) 07/17/2014   CREATININE 5.52 (H) 01/04/2016   CREATININE 3.45 (H) 07/17/2014   PROT 5.0 (L) 12/25/2015   PROT 5.7 (L) 07/16/2014   ALBUMIN 1.9 (L) 01/04/2016   ALBUMIN 2.6 (L) 07/16/2014   BILITOT 0.5 12/25/2015   BILITOT 0.4 07/16/2014   ALKPHOS 66 12/25/2015   ALKPHOS 104 07/16/2014   AST 17 12/25/2015   AST 14 (L) 07/16/2014   ALT 9 (L) 12/25/2015   ALT 9 (L) 07/16/2014  .   Total Time in preparing paper work, data evaluation and todays exam - 35 minutes  Tymira Horkey M.D on 01/04/2016 at 9:44 AM    Note: This dictation was prepared with Dragon dictation along with smaller phrase technology. Any transcriptional errors that result from this process are unintentional.

## 2016-01-04 NOTE — Care Management (Signed)
Patient for discharge to skilled nursing today

## 2016-01-04 NOTE — Progress Notes (Signed)
[facility]  ROUNDING NOTE   Subjective:   Seen and examined on hemodialysis. Tolerating treatment well.     HEMODIALYSIS FLOWSHEET:  Blood Flow Rate (mL/min): 400 mL/min Arterial Pressure (mmHg): -170 mmHg Venous Pressure (mmHg): 160 mmHg Transmembrane Pressure (mmHg): 40 mmHg Ultrafiltration Rate (mL/min): 830 mL/min Dialysate Flow Rate (mL/min): 600 ml/min Conductivity: Machine : 13.9 Conductivity: Machine : 13.9 Dialysis Fluid Bolus: Normal Saline Bolus Amount (mL): 250 mL Dialysate Change: Other (comment) (3K) Intra-Hemodialysis Comments: Resting    Objective:  Vital signs in last 24 hours:  Temp:  [98 F (36.7 C)-98.5 F (36.9 C)] 98.2 F (36.8 C) (10/05 1047) Pulse Rate:  [68-73] 72 (10/05 1100) Resp:  [12-20] 19 (10/05 1100) BP: (147-189)/(53-75) 186/73 (10/05 1100) SpO2:  [98 %-100 %] 100 % (10/05 1056) Weight:  [110.2 kg (242 lb 15.2 oz)-118.5 kg (261 lb 4.8 oz)] 110.2 kg (242 lb 15.2 oz) (10/05 1047)  Weight change: 1.125 kg (2 lb 7.7 oz) Filed Weights   01/04/16 0030 01/04/16 0700 01/04/16 1047  Weight: 118.5 kg (261 lb 4.8 oz) 118.5 kg (261 lb 4.6 oz) 110.2 kg (242 lb 15.2 oz)    Intake/Output: I/O last 3 completed shifts: In: 200 [P.O.:100; IV Piggyback:100] Out: 750 [Urine:750]   Intake/Output this shift:  Total I/O In: -  Out: 150 [Urine:150]  Physical Exam: General: Laying down in bed  Head: Normocephalic, atraumatic. Moist oral mucosal membranes  Eyes: Anicteric  Neck: Supple, trachea midline  Lungs:  Clear to auscultation, normal effort  Heart: S1S2 no rubs  Abdomen:  Soft, nontender, BS present, PD catheter in place  Extremities: RLE BKA, no lower extremity edema  Neurologic: Nonfocal, moving all four extremities  Skin: No lesions  Access: PD cath in place, [facility] permcath Dr. Wyn Quakerew 10/2    Basic Metabolic Panel:  Recent Labs Lab 12/28/15 1706 12/29/15 0425 01/02/16 1041 01/04/16 0407  NA  --  132* 130* 132*  K   --  5.0 4.6 3.7  CL  --  95* 92* 94*  CO2  --  30 32 32  GLUCOSE  --  198* 143* 118*  BUN  --  66* 71* 48*  CREATININE  --  5.90* 6.94* 5.52*  CALCIUM  --  10.2 10.9* 10.5*  PHOS 3.7  --  4.4 3.3    Liver Function Tests:  Recent Labs Lab 01/02/16 1041 01/04/16 0407  ALBUMIN 1.8* 1.9*   No results for input(s): LIPASE, AMYLASE in the last 168 hours. No results for input(s): AMMONIA in the last 168 hours.  CBC:  Recent Labs Lab 12/29/15 0425 01/02/16 0506 01/02/16 1041 01/03/16 0424  WBC 6.0 9.2 8.4 11.5*  HGB 7.5* 7.5* 6.5* 8.3*  HCT 22.2* 21.9* 19.1* 24.8*  MCV 91.8 90.8 89.8 89.6  PLT 264 407 419 430    Cardiac Enzymes: No results for input(s): CKTOTAL, CKMB, CKMBINDEX, TROPONINI in the last 168 hours.  BNP: Invalid input(s): POCBNP  CBG:  Recent Labs Lab 01/03/16 0731 01/03/16 1110 01/03/16 1645 01/03/16 2127 01/04/16 0748  GLUCAP 123* 133* 117* 118* 134*    Microbiology: Results for orders placed or performed during the hospital encounter of 12/25/15  Blood culture (routine x 2)     Status: Abnormal   Collection Time: 12/25/15  2:32 PM  Result Value Ref Range Status   Specimen Description BLOOD  L HAND  Final   Special Requests   Final    BOTTLES DRAWN AEROBIC AND ANAEROBIC  AER 12 ML  ANA 14 ML   Culture  Setup Time   Final    GRAM NEGATIVE RODS IN BOTH AEROBIC AND ANAEROBIC BOTTLES CRITICAL VALUE NOTED.  VALUE IS CONSISTENT WITH PREVIOUSLY REPORTED AND CALLED VALUE. SDR    Culture (A)  Final    ENTEROBACTER CLOACAE SUSCEPTIBILITIES PERFORMED ON PREVIOUS CULTURE WITHIN THE LAST 5 DAYS. Performed at [facility]    Report Status 12/30/2015 FINAL  Final  Blood culture (routine x 2)     Status: Abnormal   Collection Time: 12/25/15  2:32 PM  Result Value Ref Range Status   Specimen Description BLOOD  L FA  Final   Special Requests   Final    BOTTLES DRAWN AEROBIC AND ANAEROBIC  AER 12 ML ANA 12 ML   Culture  Setup Time   Final     GRAM NEGATIVE RODS IN BOTH AEROBIC AND ANAEROBIC BOTTLES CRITICAL RESULT CALLED TO, READ BACK BY AND VERIFIED WITH: NATE COOKSON AT 0535 12/26/15 SDR Performed at [facility]    Culture ENTEROBACTER CLOACAE (A)  Final   Report Status 12/30/2015 FINAL  Final   Organism ID, Bacteria ENTEROBACTER CLOACAE  Final      Susceptibility   Enterobacter cloacae - MIC*    CEFAZOLIN >=64 RESISTANT Resistant     CEFEPIME <=1 SENSITIVE Sensitive     CEFTAZIDIME 16 INTERMEDIATE Intermediate     CEFTRIAXONE >=64 RESISTANT Resistant     CIPROFLOXACIN >=4 RESISTANT Resistant     GENTAMICIN <=1 SENSITIVE Sensitive     IMIPENEM <=0.25 SENSITIVE Sensitive     TRIMETH/SULFA <=20 SENSITIVE Sensitive     PIP/TAZO >=128 RESISTANT Resistant     * ENTEROBACTER CLOACAE  Blood Culture ID Panel (Reflexed)     Status: Abnormal   Collection Time: 12/25/15  2:32 PM  Result Value Ref Range Status   Enterococcus species NOT DETECTED NOT DETECTED Final   Listeria monocytogenes NOT DETECTED NOT DETECTED Final   Staphylococcus species NOT DETECTED NOT DETECTED Final   Staphylococcus aureus NOT DETECTED NOT DETECTED Final   Streptococcus species NOT DETECTED NOT DETECTED Final   Streptococcus agalactiae NOT DETECTED NOT DETECTED Final   Streptococcus pneumoniae NOT DETECTED NOT DETECTED Final   Streptococcus pyogenes NOT DETECTED NOT DETECTED Final   Acinetobacter baumannii NOT DETECTED NOT DETECTED Final   Enterobacteriaceae species DETECTED (A) NOT DETECTED Final    Comment: CRITICAL RESULT CALLED TO, READ BACK BY AND VERIFIED WITH:  NATE COOKSON AT 0535 12/26/15 SDR    Enterobacter cloacae complex DETECTED (A) NOT DETECTED Corrected    Comment: CRITICAL RESULT CALLED TO, READ BACK BY AND VERIFIED WITH: K MERRILL 12/28/15 @ 1210 M VESTAL CORRECTED ON 09/28 AT 1211: PREVIOUSLY REPORTED AS NOT DETECTED    Escherichia coli NOT DETECTED NOT DETECTED Corrected    Comment: PREVIOUSLY REPORTED  AS: DETECTED CORRECTED RESULTS CALLED TO: K MERRILL 12/28/15 @ 1210 M VESTAL Performed at [facility] CORRECTED ON 09/28 AT 1211: PREVIOUSLY REPORTED AS DETECTED CRITICAL RESULT CALLED TO, READ BACK BY AND VERIFIED WITH:  NATE COOKSON AT 0535 12/26/15 SDR    Klebsiella oxytoca NOT DETECTED NOT DETECTED Final   Klebsiella pneumoniae NOT DETECTED NOT DETECTED Final   Proteus species NOT DETECTED NOT DETECTED Final   Serratia marcescens NOT DETECTED NOT DETECTED Final   Carbapenem resistance NOT DETECTED NOT DETECTED Final   Haemophilus influenzae NOT DETECTED NOT DETECTED Final   Neisseria meningitidis NOT DETECTED NOT DETECTED Final   Pseudomonas aeruginosa NOT  DETECTED NOT DETECTED Final   Candida albicans NOT DETECTED NOT DETECTED Final   Candida glabrata NOT DETECTED NOT DETECTED Final   Candida krusei NOT DETECTED NOT DETECTED Final   Candida parapsilosis NOT DETECTED NOT DETECTED Final   Candida tropicalis NOT DETECTED NOT DETECTED Final  Culture, body fluid-bottle     Status: None   Collection Time: 12/25/15  4:00 PM  Result Value Ref Range Status   Specimen Description FLUID PERITONEAL DIALYSATE  Final   Special Requests BOTTLES DRAWN AEROBIC AND ANAEROBIC 10CC  Final   Culture   Final    NO GROWTH 5 DAYS Performed at [facility]    Report Status 12/30/2015 FINAL  Final  Gram stain     Status: None   Collection Time: 12/25/15  4:00 PM  Result Value Ref Range Status   Specimen Description FLUID PERITONEAL DIALYSATE  Final   Special Requests NONE  Final   Gram Stain   Final    FEW WBC PRESENT,BOTH PMN AND MONONUCLEAR NO ORGANISMS SEEN Performed at [facility]    Report Status 12/27/2015 FINAL  Final  MRSA PCR Screening     Status: Abnormal   Collection Time: 12/26/15  1:34 PM  Result Value Ref Range Status   MRSA by PCR POSITIVE (A) NEGATIVE Final    Comment:        The GeneXpert MRSA Assay (FDA approved for NASAL specimens only), is one  component of a comprehensive MRSA colonization surveillance program. It is not intended to diagnose MRSA infection nor to guide or monitor treatment for MRSA infections. RESULT CALLED TO, READ BACK BY AND VERIFIED WITH: AMY TODD RN AT 1455 12/26/15 MSS.   Culture, body fluid-bottle     Status: None   Collection Time: 12/27/15  4:15 PM  Result Value Ref Range Status   Specimen Description PERITONEAL DIALYSATE  Final   Special Requests NONE  Final   Culture   Final    NO GROWTH 5 DAYS Performed at [facility]    Report Status 01/01/2016 FINAL  Final  Gram stain     Status: None   Collection Time: 12/27/15  4:15 PM  Result Value Ref Range Status   Specimen Description PERITONEAL DIALYSATE  Final   Special Requests NONE  Final   Gram Stain   Final    WBC PRESENT,BOTH PMN AND MONONUCLEAR NO ORGANISMS SEEN CYTOSPIN Performed at [facility]    Report Status 12/27/2015 FINAL  Final  CULTURE, BLOOD (ROUTINE X 2) w Reflex to ID Panel     Status: None   Collection Time: 12/28/15  7:19 PM  Result Value Ref Range Status   Specimen Description BLOOD RIGHT AC  Final   Special Requests BOTTLES DRAWN AEROBIC AND ANAEROBIC 10 ML  Final   Culture NO GROWTH 5 DAYS  Final   Report Status 01/02/2016 FINAL  Final    Coagulation Studies: No results for input(s): LABPROT, INR in the last 72 hours.  Urinalysis: No results for input(s): COLORURINE, LABSPEC, PHURINE, GLUCOSEU, HGBUR, BILIRUBINUR, KETONESUR, PROTEINUR, UROBILINOGEN, NITRITE, LEUKOCYTESUR in the last 72 hours.  Invalid input(s): APPERANCEUR    Imaging: Dg Abd 1 View  Result Date: 01/02/2016 CLINICAL DATA:  50 year old female with abdominal pain and constipation. EXAM: ABDOMEN - 1 VIEW COMPARISON:  Radiograph dated 07/31/2015 FINDINGS: Large amount of stool noted throughout the visualized colon. Dense stool noted in the rectosigmoid. No bowel dilatation or evidence of obstruction. There is no free  air or  radiopaque calculi. The soft tissues and osseous structures appear unremarkable. IMPRESSION: Constipation.  No bowel obstruction. Electronically Signed   By: Elgie Collard M.D.   On: 01/02/2016 20:03     Medications:     . sodium chloride   Intravenous Once  . calcium acetate  1,334 mg Oral TID WC  . calcium carbonate  1 tablet Oral TID WC  . clopidogrel  75 mg Oral Daily  . colesevelam  625 mg Oral BID WC  . dialysis solution 2.5% low-MG/low-CA   Intraperitoneal Q24H  . docusate sodium  100 mg Oral QHS  . epoetin (EPOGEN/PROCRIT) injection  10,000 Units Intravenous Q T,Th,Sa-HD  . feeding supplement (PRO-STAT SUGAR FREE 64)  30 mL Oral TID WC  . gentamicin cream  1 application Topical Daily  . heparin subcutaneous  5,000 Units Subcutaneous Q8H  . hydrALAZINE  25 mg Oral Daily  . insulin aspart  0-10 Units Subcutaneous TID WC  . insulin glargine  8 Units Subcutaneous QHS  . isosorbide mononitrate  60 mg Oral Daily  . lactulose  10 g Oral Daily  . levETIRAcetam  500 mg Oral q morning - 10a  . lisinopril  40 mg Oral Daily  . metaxalone  800 mg Oral BID  . metoprolol  50 mg Oral BID  . mupirocin ointment  1 application Nasal BID  . nicotine  14 mg Transdermal Daily  . nystatin  1 g Topical BID  . pantoprazole  40 mg Oral Daily  . polyethylene glycol  17 g Oral Daily  . ranolazine  500 mg Oral BID  . senna  2 tablet Oral BID  . sodium chloride flush  3 mL Intravenous Q12H  . torsemide  100 mg Oral Daily  . white petrolatum   Topical Q72H   acetaminophen **OR** acetaminophen, bisacodyl, lactulose, metolazone, metoprolol, morphine injection, nitroGLYCERIN, ondansetron **OR** ondansetron (ZOFRAN) IV, oxyCODONE-acetaminophen  Assessment/ Plan:  50 y.o. white female with end-stage renal disease on hemodialysis, asthma, gastroparesis, diabetes mellitus type II, congestive heart failure, hypertension, endometriosis, coronary atherosclerosis  [facility]/ [address]/ TTS    1. End-stage renal disease on hemodialysis dialysis: tolerating hemodialysis treatment   2. Anemia of CKD: PRBC transfusion 1 unit 10/3 - EPO with treatment.   3. Sepsis with bactermia: 9/25 blood cultures positive for enterobacter. Tunneled catheter removed. Aztreonam given on 9/25. Levofloxacin ordered from 9/26-10/1, Meropenem ordered from 9/30 to 104 - appreciate ID input  4. Secondary Hyperparathyroidism:  Patient taken off of calcitriol as PTH low and hypercalcemia. Phosphorus at goal.  Calcium still high at 10.5  -Hold calcium acetate for now.  - Continue to monitor calcium levels.    LOS: 8 Kimberly Montgomery 10/5/201711:08 AM

## 2016-01-08 ENCOUNTER — Encounter: Payer: Self-pay | Admitting: Internal Medicine

## 2016-01-08 ENCOUNTER — Non-Acute Institutional Stay (SKILLED_NURSING_FACILITY): Payer: Medicare HMO | Admitting: Internal Medicine

## 2016-01-08 DIAGNOSIS — K5909 Other constipation: Secondary | ICD-10-CM

## 2016-01-08 DIAGNOSIS — N186 End stage renal disease: Secondary | ICD-10-CM | POA: Diagnosis not present

## 2016-01-08 DIAGNOSIS — D638 Anemia in other chronic diseases classified elsewhere: Secondary | ICD-10-CM | POA: Diagnosis not present

## 2016-01-08 DIAGNOSIS — I214 Non-ST elevation (NSTEMI) myocardial infarction: Secondary | ICD-10-CM

## 2016-01-08 DIAGNOSIS — I739 Peripheral vascular disease, unspecified: Secondary | ICD-10-CM

## 2016-01-08 DIAGNOSIS — R7881 Bacteremia: Secondary | ICD-10-CM

## 2016-01-08 DIAGNOSIS — I5032 Chronic diastolic (congestive) heart failure: Secondary | ICD-10-CM | POA: Diagnosis not present

## 2016-01-08 DIAGNOSIS — N2581 Secondary hyperparathyroidism of renal origin: Secondary | ICD-10-CM

## 2016-01-08 DIAGNOSIS — F319 Bipolar disorder, unspecified: Secondary | ICD-10-CM | POA: Diagnosis not present

## 2016-01-08 DIAGNOSIS — K219 Gastro-esophageal reflux disease without esophagitis: Secondary | ICD-10-CM

## 2016-01-08 DIAGNOSIS — E441 Mild protein-calorie malnutrition: Secondary | ICD-10-CM

## 2016-01-08 DIAGNOSIS — L89152 Pressure ulcer of sacral region, stage 2: Secondary | ICD-10-CM

## 2016-01-08 DIAGNOSIS — B952 Enterococcus as the cause of diseases classified elsewhere: Secondary | ICD-10-CM

## 2016-01-08 DIAGNOSIS — R5381 Other malaise: Secondary | ICD-10-CM

## 2016-01-08 DIAGNOSIS — D72828 Other elevated white blood cell count: Secondary | ICD-10-CM | POA: Diagnosis not present

## 2016-01-08 DIAGNOSIS — Z992 Dependence on renal dialysis: Secondary | ICD-10-CM

## 2016-01-08 NOTE — Progress Notes (Signed)
LOCATION: [facility]  PCP: Jarome Matin, MD   Code Status: Full Code  Goals of care: Advanced Directive information Advanced Directives 12/25/2015  Does patient have an advance directive? No  Would patient like information on creating an advanced directive? No - patient declined information       Extended Emergency Contact Information Primary Emergency Contact: Dixon,Heather N Address: [address]          [place], [place] [address] [place] Home Phone: [phone] Relation: Daughter   Allergies  Allergen Reactions  . Cephalosporins Anaphylaxis    Patient has tolerated meropenem.   [name] Penicillins Anaphylaxis and Other (See Comments)    Has patient had a PCN reaction causing immediate rash, facial/tongue/throat swelling, SOB or lightheadedness with hypotension: Yes Has patient had a PCN reaction causing severe rash involving mucus membranes or skin necrosis: No Has patient had a PCN reaction that required hospitalization No Has patient had a PCN reaction occurring within the last 10 years: No If all of the above answers are "NO", then may proceed with Cephalosporin use.  . Lamictal [Lamotrigine] Other (See Comments)    Reaction:  Hallucinations  . Phenergan [Promethazine Hcl] Nausea And Vomiting  . Pravastatin Other (See Comments)    Reaction:  Muscle pain   . Sulfa Antibiotics Other (See Comments)    Reaction:  Unknown     Chief Complaint  Patient presents with  . New Admit To SNF    New Admission Visit     HPI:  Patient is a 50 y.o. female seen today for short term rehabilitation and then long term care post hospital admission from 12/25/15-01/04/16 with chest pain and fever of unknown origin. She had enterobacter bacteremia. She underwent cardiac catheterization a month back and this was negative for restenosis of her stent. Medical management for her CAD was continued with outpatient follow up recommended by cardiology. Chest pain was  thought to be in setting of infection. Her right permacath was removed and a new left IJ permacath was placed. She has PMH of ESRD on HD, PVD, DM type 2 among others. She is seen in her room today.   Review of Systems:  Constitutional: Negative for fever, chills, diaphoresis. Feels weak and tired. HENT: Negative for headache, congestion, nasal discharge Eyes: Negative for blurred vision, double vision and discharge.  Respiratory: Negative for cough, shortness of breath and wheezing.   Cardiovascular: Negative for chest pain, palpitations, leg swelling.  Gastrointestinal: Negative for heartburn, nausea, vomiting, abdominal pain. Last bowel movement was yesterday. No rectal bleed Genitourinary: Negative for dysuria and flank pain.  Musculoskeletal: Negative for fall in the facility. Positive for lower back pain.  Skin: Negative for itching, rash.  Neurological: Negative for dizziness. Psychiatric/Behavioral: Negative for depression   Past Medical History:  Diagnosis Date  . Asthma   . CAD (coronary artery disease)   . CHF (congestive heart failure) (HCC)   . Diabetes mellitus without complication (HCC)   . Endometriosis   . ESRD (end stage renal disease) (HCC)   . Gastroparesis   . GERD (gastroesophageal reflux disease)   . HLD (hyperlipidemia)   . Hypertension   . Renal disorder    Past Surgical History:  Procedure Laterality Date  . BELOW KNEE LEG AMPUTATION    . CARDIAC CATHETERIZATION Right 07/10/2015   Procedure: Left Heart Cath and Coronary Angiography;  Surgeon: Laurier Nancy, MD;  Location: [facility];  Service: Cardiovascular;  Laterality: Right;  . CARDIAC CATHETERIZATION Right 09/14/2015   Procedure: Left Heart Cath and Coronary Angiography;  Surgeon: Laurier Nancy, MD;  Location: [facility];  Service: Cardiovascular;  Laterality: Right;  . CARDIAC CATHETERIZATION N/A 09/14/2015   Procedure: Coronary Stent Intervention;  Surgeon: Alwyn Pea,  MD;  Location: [facility];  Service: Cardiovascular;  Laterality: N/A;  . CARDIAC CATHETERIZATION Right 11/20/2015   Procedure: Left Heart Cath and Coronary Angiography;  Surgeon: Laurier Nancy, MD;  Location: [facility];  Service: Cardiovascular;  Laterality: Right;  . hd fistula surgery with reversal    . HYSTEROTOMY    . pd tube inserstion    . PERIPHERAL VASCULAR CATHETERIZATION N/A 08/30/2015   Procedure: Dialysis/Perma Catheter Insertion;  Surgeon: Annice Needy, MD;  Location: [facility];  Service: Cardiovascular;  Laterality: N/A;  . PERIPHERAL VASCULAR CATHETERIZATION N/A 01/01/2016   Procedure: Dialysis/Perma Catheter Insertion;  Surgeon: Annice Needy, MD;  Location: [facility];  Service: Cardiovascular;  Laterality: N/A;   Social History:   reports that she has quit smoking. Her smoking use included Cigarettes. She smoked 0.50 packs per day. She has never used smokeless tobacco. She reports that she does not drink alcohol or use drugs.  Family History  Problem Relation Age of Onset  . CAD    . Diabetes    . Bipolar disorder    . Cervical cancer Mother     Medications:   Medication List       Accurate as of 01/08/16 11:25 AM. Always use your most recent med list.          bisacodyl 10 MG suppository Commonly known as:  DULCOLAX Place 10 mg rectally daily as needed for moderate constipation. Use if no relief from Milk of Magnesia   calcitRIOL 0.25 MCG capsule Commonly known as:  ROCALTROL Take 0.25 mcg by mouth daily.   calcium acetate 667 MG capsule Commonly known as:  PHOSLO Take 3 capsules (2,001 mg total) by mouth 3 (three) times daily with meals.   calcium carbonate 500 MG chewable tablet Commonly known as:  TUMS - dosed in mg elemental calcium Chew 1 tablet (200 mg of elemental calcium total) by mouth 3 (three) times daily with meals.   clopidogrel 75 MG tablet Commonly known as:  PLAVIX Take 75 mg by mouth daily.     colesevelam 625 MG tablet Commonly known as:  WELCHOL Take 1 tablet (625 mg total) by mouth 2 (two) times daily with a meal.   docusate sodium 100 MG capsule Commonly known as:  COLACE Take 100 mg by mouth at bedtime.   feeding supplement (PRO-STAT SUGAR FREE 64) Liqd Take 30 mLs by mouth 3 (three) times daily with meals.   hydrALAZINE 25 MG tablet Commonly known as:  APRESOLINE Take 25 mg by mouth daily.   insulin glargine 100 UNIT/ML injection Commonly known as:  LANTUS Inject 0.08 mLs (8 Units total) into the skin at bedtime.   isosorbide mononitrate 60 MG 24 hr tablet Commonly known as:  IMDUR Take 1 tablet (60 mg total) by mouth daily.   lactulose 10 GM/15ML solution Commonly known as:  CHRONULAC Take 10 g by mouth daily.   levETIRAcetam 500 MG tablet Commonly known as:  KEPPRA Take 1 tablet (500 mg total) by mouth every morning.   lisinopril 40 MG tablet Commonly known as:  PRINIVIL,ZESTRIL Take 40 mg by mouth daily.   metoCLOPramide  10 MG tablet Commonly known as:  REGLAN Take 1 tablet (10 mg total) by mouth 4 (four) times daily.   metoprolol 50 MG tablet Commonly known as:  LOPRESSOR Take 1 tablet (50 mg total) by mouth 2 (two) times daily.   mupirocin ointment 2 % Commonly known as:  BACTROBAN Place 1 application into the nose 2 (two) times daily.   nicotine 14 mg/24hr patch Commonly known as:  NICODERM CQ - dosed in mg/24 hours Place 14 mg onto the skin daily.   nitroGLYCERIN 0.4 MG SL tablet Commonly known as:  NITROSTAT Place 0.4 mg under the tongue every 5 (five) minutes as needed for chest pain. Reported on 07/10/2015   NOVOLOG FLEXPEN 100 UNIT/ML FlexPen Generic drug:  insulin aspart Inject 0-10 Units into the skin 3 (three) times daily with meals. Inject per sliding scale: If 0-150 = 0    151-200 = 2 units   201-250 = 4 units   251-300 = 6 units    301-350 = 8 units    351-400 = 10 units  Anything above 400, call MD   nystatin  powder Commonly known as:  MYCOSTATIN/NYSTOP Apply 1 g topically 2 (two) times daily.   omeprazole 20 MG capsule Commonly known as:  PRILOSEC Take 20 mg by mouth daily before breakfast.   ondansetron 4 MG disintegrating tablet Commonly known as:  ZOFRAN ODT Take 1 tablet (4 mg total) by mouth every 6 (six) hours as needed for nausea or vomiting.   oxyCODONE-acetaminophen 5-325 MG tablet Commonly known as:  PERCOCET/ROXICET Take 1 tablet by mouth every 6 (six) hours as needed for moderate pain.   polyethylene glycol packet Commonly known as:  MIRALAX / GLYCOLAX Take 17 g by mouth daily.   ranolazine 500 MG 12 hr tablet Commonly known as:  RANEXA Take 1 tablet (500 mg total) by mouth 2 (two) times daily.   risperiDONE 1 MG tablet Commonly known as:  RISPERDAL Take 1 mg by mouth 3 (three) times daily.   senna 8.6 MG Tabs tablet Commonly known as:  SENOKOT Take 2 tablets by mouth 2 (two) times daily.   torsemide 100 MG tablet Commonly known as:  DEMADEX Take 100 mg by mouth daily.   XEROFORM PETROLATUM DRESSING Pads Apply 1 each topically every 3 (three) days.       Immunizations: There is no immunization history for the selected administration types on file for this patient.   Physical Exam: Vitals:   01/08/16 1105  BP: (!) 180/88  Pulse: 99  Resp: 18  Temp: 97.1 F (36.2 C)  TempSrc: Oral  SpO2: 100%  Weight: 255 lb (115.7 kg)  Height: 5\' 9"  (1.753 m)   Body mass index is 37.66 kg/m.  General- adult female, obese, in no acute distress Head- normocephalic, atraumatic Nose- no nasal discharge Throat- moist mucus membrane Eyes- PERRLA, EOMI, no pallor, no icterus Neck- no cervical lymphadenopathy Cardiovascular- normal s1,s2, no murmur, no leg edema Respiratory- bilateral clear to auscultation, no wheeze, no rhonchi, no crackles, no use of accessory muscles. On 2 l o2 by [place] Abdomen- bowel sounds present, soft, non tender, PD catheter on right abdominal  quadrant Musculoskeletal- right BKA, able to move all other 3 extremities, generalized weakness Neurological- alert and oriented to person, place and time Skin- warm and dry, left IJ permcath placed on 01/01/16 Psychiatry- normal mood and affect    Labs reviewed: Basic Metabolic Panel:  Recent Labs  29/56/21 0547  07/21/15 0427  11/19/15  1547  01/02/16 1041 01/04/16 0407 01/04/16 1055  NA 140  < > 128*  < >  --   < > 130* 132* 130*  K 4.7  < > 4.2  < >  --   < > 4.6 3.7 3.9  CL 109  < > 91*  < >  --   < > 92* 94* 91*  CO2 22  < > 22  < >  --   < > 32 32 29  GLUCOSE 181*  < > 157*  < >  --   < > 143* 118* 147*  BUN 64*  < > 62*  < >  --   < > 71* 48* 51*  CREATININE 7.10*  < > 9.89*  < >  --   < > 6.94* 5.52* 5.58*  CALCIUM 7.8*  < > 5.9*  < >  --   < > 10.9* 10.5* 10.5*  MG 2.4  --  1.9  --  1.9  --   --   --   --   PHOS 8.0*  8.0*  < >  --   < >  --   < > 4.4 3.3 3.5  < > = values in this interval not displayed. Liver Function Tests:  Recent Labs  07/29/15 0424 08/23/15 2319  12/25/15 1434 01/02/16 1041 01/04/16 0407 01/04/16 1055  AST 18 13*  --  17  --   --   --   ALT 12* 7*  --  9*  --   --   --   ALKPHOS 77 105  --  66  --   --   --   BILITOT 0.5 0.6  --  0.5  --   --   --   PROT 4.9* 5.4*  --  5.0*  --   --   --   ALBUMIN 1.8* 1.5*  < > 2.2* 1.8* 1.9* 1.9*  < > = values in this interval not displayed.  Recent Labs  07/15/15 1639 08/23/15 2319  LIPASE 23 14    Recent Labs  08/23/15 2319  AMMONIA 29   CBC:  Recent Labs  12/05/15 1821 12/07/15 0800  12/25/15 1434  01/02/16 1041 01/03/16 0424 01/04/16 1055  WBC 4.4 6.4  < > 11.0  < > 8.4 11.5* 12.4*  NEUTROABS 3.3 4.1  --  9.6*  --   --   --   --   HGB 9.8* 9.4*  < > 7.7*  < > 6.5* 8.3* 7.9*  HCT 29.1* 27.5*  < > 23.5*  < > 19.1* 24.8* 23.8*  MCV 97.1 96.8  < > 95.2  < > 89.8 89.6 89.5  PLT 233 204  < > 226  < > 419 430 496*  < > = values in this interval not displayed. Cardiac  Enzymes:  Recent Labs  12/25/15 2036 12/26/15 0120 12/26/15 0431  TROPONINI 1.01* 0.63* 0.54*   BNP: Invalid input(s): POCBNP CBG:  Recent Labs  01/03/16 2127 01/04/16 0748 01/04/16 1618  GLUCAP 118* 134* 133*    Radiological Exams: Dg Chest 1 View  Result Date: 01/02/2016 CLINICAL DATA:  Status post dialysis catheter placement today. EXAM: CHEST 1 VIEW COMPARISON:  Single-view of the chest 12/25/2015. FINDINGS: New left IJ approach dialysis catheter is in place with the tip projecting at the superior cavoatrial junction. No pneumothorax. Right IJ dialysis catheter present on the prior exam has been removed. Lungs are clear. Heart size is normal. No  pleural fluid. IMPRESSION: Tip of new dialysis catheter projects at the superior cavoatrial junction. Negative for pneumothorax. Electronically Signed   By: Drusilla Kanner M.D.   On: 01/02/2016 10:27   Dg Chest 2 View  Result Date: 12/24/2015 CLINICAL DATA:  Chest pain this morning. Previous coronary artery stent placement. EXAM: CHEST  2 VIEW COMPARISON:  12/07/2015. FINDINGS: Stable enlarged cardiac silhouette. The right jugular catheter tip remains in the superior vena cava. The interstitial markings are minimally prominent. Otherwise, clear lungs. Small to moderate-sized pleural effusion posteriorly, most likely on the left. Aortic arch calcification. Mild thoracic spine degenerative changes. Minimal bilateral shoulder degenerative changes. IMPRESSION: 1. Small to moderate size left pleural effusion posteriorly at the lung bases. 2. Mild aortic atherosclerosis. 3. Minimal chronic interstitial lung disease. Electronically Signed   By: Beckie Salts M.D.   On: 12/24/2015 08:45   Dg Abd 1 View  Result Date: 01/02/2016 CLINICAL DATA:  50 year old female with abdominal pain and constipation. EXAM: ABDOMEN - 1 VIEW COMPARISON:  Radiograph dated 07/31/2015 FINDINGS: Large amount of stool noted throughout the visualized colon. Dense stool  noted in the rectosigmoid. No bowel dilatation or evidence of obstruction. There is no free air or radiopaque calculi. The soft tissues and osseous structures appear unremarkable. IMPRESSION: Constipation.  No bowel obstruction. Electronically Signed   By: Elgie Collard M.D.   On: 01/02/2016 20:03   Ct Head Wo Contrast  Result Date: 12/30/2015 CLINICAL DATA:  Altered mental status EXAM: CT HEAD WITHOUT CONTRAST TECHNIQUE: Contiguous axial images were obtained from the base of the skull through the vertex without intravenous contrast. COMPARISON:  07/21/2015 FINDINGS: Brain: Mild global atrophy. Chronic ischemic changes in the periventricular white matter. No mass effect, midline shift, or acute hemorrhage. Vascular: Carotid siphon calcifications Skull: Intact Sinuses/Orbits: Mucosal thickening in the left maxillary sinus. Other: Noncontributory IMPRESSION: No acute intracranial pathology.  Chronic changes. Electronically Signed   By: Jolaine Click M.D.   On: 12/30/2015 14:31   Dg Chest Portable 1 View  Result Date: 12/25/2015 CLINICAL DATA:  Chest pain since last night. EXAM: PORTABLE CHEST 1 VIEW COMPARISON:  December 24, 2015 FINDINGS: The heart size and mediastinal contours are stable. Right central venous sinus unchanged distal tip in superior vena cava. There is no focal infiltrate, pulmonary edema, or pleural effusion. The visualized skeletal structures are stable. IMPRESSION: No active cardiopulmonary disease. Electronically Signed   By: Sherian Rein M.D.   On: 12/25/2015 15:54    Assessment/Plan  Physical deconditioning Will have her work with physical therapy and occupational therapy team to help with gait training and muscle strengthening exercises.fall precautions. Skin care. Encourage to be out of bed.   Enterococcus bacteremia 9/25 blood culture positive, tunneled right catheter has been removed. Has completed antibiotic course. Afebrile. Monitor wbc and temp curve  Protein  calorie malnutrition Monitor po intake and weight, RD to evaluate, continue feeding supplement  Leukocytosis Afebrile, monitor wbc  CAD Continue plavix, lisinopril, imdur, lopressor. Chest pain free. Continue ranexa. Cardiology follow up  CHF Continue torsemide 100 mg daily, hydralazine 25 mg daily, lopressor 50 mg bid, lisinopril 40 mg, imdur 60 mg daily . Monitor her breathing, weight. Check bp reading  PVD S/p RLE BKA. Continue plavix and percocet 5-325 mg q6h prn phantom pain  Anemia of chronic disease Monitor cbc. S/p 1 u prbc transfusion 01/02/16.   ESRD On HD 3 days a week.   Bipolar 1 disorder Stable, get psych to evaluate for continuity of care.  Continue keppra and risperdal  Secondary hyperparathyroidism Monitor phosphorus and calcium level periodically. Off calcitriol and calcium acetate to be held per renal rec on notes. Pt discharged from hospital on both and is currently receiving them. D.c calcitriol for now and hold calcium acetate. F/u with nephrology  Hypercalcemia D/c calcitriol for now and hold calcium acetate. Check ca level  gerd Stable, continue prilosec  Constipation Continue senokot, lactulose and miralax. D/c colace  Stage 2 sacral pressure ulcer Continue wound care. Add decubivite to promote wound healing    Goals of care: short term rehabilitation and then long term care   Labs/tests ordered: cbc, cmp  Family/ staff Communication: reviewed care plan with patient and nursing supervisor    Oneal Grout, MD Internal Medicine [facility] [address] [place], [place] [address] Cell Phone (Monday-Friday 8 am - 5 pm): [phone] On Call: [phone] and follow prompts after 5 pm and on weekends Office Phone: [phone] Office Fax: [phone]  \

## 2016-01-10 LAB — HEPATIC FUNCTION PANEL
AST: 7 U/L — AB (ref 13–35)
Alkaline Phosphatase: 80 U/L (ref 25–125)
BILIRUBIN, TOTAL: 0.2 mg/dL

## 2016-01-10 LAB — BASIC METABOLIC PANEL
BUN: 21 mg/dL (ref 4–21)
CREATININE: 3.4 mg/dL — AB (ref 0.5–1.1)
Glucose: 198 mg/dL
Potassium: 3.5 mmol/L (ref 3.4–5.3)
Sodium: 138 mmol/L (ref 137–147)

## 2016-01-10 LAB — CBC AND DIFFERENTIAL
HEMATOCRIT: 25 % — AB (ref 36–46)
Hemoglobin: 7.8 g/dL — AB (ref 12.0–16.0)
PLATELETS: 495 10*3/uL — AB (ref 150–399)
WBC: 10.9 10*3/mL

## 2016-01-11 ENCOUNTER — Non-Acute Institutional Stay (SKILLED_NURSING_FACILITY): Payer: Medicare HMO | Admitting: Family

## 2016-01-11 ENCOUNTER — Encounter: Payer: Self-pay | Admitting: Family

## 2016-01-11 DIAGNOSIS — D72829 Elevated white blood cell count, unspecified: Secondary | ICD-10-CM

## 2016-01-11 DIAGNOSIS — R600 Localized edema: Secondary | ICD-10-CM

## 2016-01-11 DIAGNOSIS — D649 Anemia, unspecified: Secondary | ICD-10-CM

## 2016-01-11 NOTE — Progress Notes (Signed)
Location:  [facility] Room Number: 408-P Place of Service:  SNF [phone] Provider: Jerren Flinchbaugh FNP-C   Jarome MatinALE, PATRICK D, MD  Patient Care Team: Jarome MatinPatrick D Dale, MD as PCP - General Laurier NancyShaukat A Khan, MD as Consulting Physician (Cardiology) Mady HaagensenMunsoor Lateef, MD as Consulting Physician (Internal Medicine)  Extended Emergency Contact Information Primary Emergency Contact: Dixon,Heather N Address: [address]          [place], [place] [address] [place] Home Phone: [phone] Relation: Daughter  Code Status: Full Code  Goals of care: Advanced Directive information Advanced Directives 12/25/2015  Does patient have an advance directive? No  Would patient like information on creating an advanced directive? No - patient declined information     Chief Complaint  Patient presents with  . Acute Visit    Abnormal labs    HPI:  Pt is a [age] y.o. female seen today at [facility] for an acute visit for evaluation of abnormal lab results. She has a significant medical history of ESRD on dialysis, Type 2 DM, Bipolar among others. She is seen in her room today.She is status post hospital admission from 12/25/15-01/04/16 with chest pain and fever of unknown origin. She had enterobacter bacteremia. She underwent cardiac catheterization a month back and this was negative for restenosis of her stent. Her recent lab results showed Hgb 7.8, WBC 10.9 ( 01/10/2016) previous WBC 11.5. She denies any fever, cough, chills or urinary symptoms.Facility staff reports no new concerns.   Past Medical History:  Diagnosis Date  . Asthma   . CAD (coronary artery disease)   . CHF (congestive heart failure) (HCC)   . Diabetes mellitus without complication (HCC)   . Endometriosis   . ESRD (end stage renal disease) (HCC)   . Gastroparesis   . GERD (gastroesophageal reflux disease)   . HLD (hyperlipidemia)   . Hypertension   . Renal disorder    Past  Surgical History:  Procedure Laterality Date  . BELOW KNEE LEG AMPUTATION    . CARDIAC CATHETERIZATION Right 07/10/2015   Procedure: Left Heart Cath and Coronary Angiography;  Surgeon: Laurier NancyShaukat A Khan, MD;  Location: [facility];  Service: Cardiovascular;  Laterality: Right;  . CARDIAC CATHETERIZATION Right 09/14/2015   Procedure: Left Heart Cath and Coronary Angiography;  Surgeon: Laurier NancyShaukat A Khan, MD;  Location: [facility];  Service: Cardiovascular;  Laterality: Right;  . CARDIAC CATHETERIZATION N/A 09/14/2015   Procedure: Coronary Stent Intervention;  Surgeon: Alwyn Peawayne D Callwood, MD;  Location: [facility];  Service: Cardiovascular;  Laterality: N/A;  . CARDIAC CATHETERIZATION Right 11/20/2015   Procedure: Left Heart Cath and Coronary Angiography;  Surgeon: Laurier NancyShaukat A Khan, MD;  Location: [facility];  Service: Cardiovascular;  Laterality: Right;  . hd fistula surgery with reversal    . HYSTEROTOMY    . pd tube inserstion    . PERIPHERAL VASCULAR CATHETERIZATION N/A 08/30/2015   Procedure: Dialysis/Perma Catheter Insertion;  Surgeon: Annice NeedyJason S Dew, MD;  Location: [facility];  Service: Cardiovascular;  Laterality: N/A;  . PERIPHERAL VASCULAR CATHETERIZATION N/A 01/01/2016   Procedure: Dialysis/Perma Catheter Insertion;  Surgeon: Annice NeedyJason S Dew, MD;  Location: [facility];  Service: Cardiovascular;  Laterality: N/A;    Allergies  Allergen Reactions  . Cephalosporins Anaphylaxis    Patient has tolerated meropenem.   [name]. Penicillins Anaphylaxis and Other (See Comments)    Has patient  had a PCN reaction causing immediate rash, facial/tongue/throat swelling, SOB or lightheadedness with hypotension: Yes Has patient had a PCN reaction causing severe rash involving mucus membranes or skin necrosis: No Has patient had a PCN reaction that required hospitalization No Has patient had a PCN reaction occurring within the last 10 years: No If all of the above  answers are "NO", then may proceed with Cephalosporin use.  . Lamictal [Lamotrigine] Other (See Comments)    Reaction:  Hallucinations  . Phenergan [Promethazine Hcl] Nausea And Vomiting  . Pravastatin Other (See Comments)    Reaction:  Muscle pain   . Sulfa Antibiotics Other (See Comments)    Reaction:  Unknown       Medication List       Accurate as of 01/11/16  5:03 PM. Always use your most recent med list.          bisacodyl 10 MG suppository Commonly known as:  DULCOLAX Place 10 mg rectally daily as needed for moderate constipation. Use if no relief from Milk of Magnesia   calcitRIOL 0.25 MCG capsule Commonly known as:  ROCALTROL Take 0.25 mcg by mouth daily.   calcium acetate 667 MG capsule Commonly known as:  PHOSLO Take 3 capsules (2,001 mg total) by mouth 3 (three) times daily with meals.   calcium carbonate 500 MG chewable tablet Commonly known as:  TUMS - dosed in mg elemental calcium Chew 1 tablet (200 mg of elemental calcium total) by mouth 3 (three) times daily with meals.   clopidogrel 75 MG tablet Commonly known as:  PLAVIX Take 75 mg by mouth daily.   colesevelam 625 MG tablet Commonly known as:  WELCHOL Take 1 tablet (625 mg total) by mouth 2 (two) times daily with a meal.   DECUBI-VITE PO Take 1 tablet by mouth daily.   feeding supplement (PRO-STAT SUGAR FREE 64) Liqd Take 30 mLs by mouth 3 (three) times daily with meals.   hydrALAZINE 25 MG tablet Commonly known as:  APRESOLINE Take 25 mg by mouth daily.   insulin glargine 100 UNIT/ML injection Commonly known as:  LANTUS Inject 0.08 mLs (8 Units total) into the skin at bedtime.   isosorbide mononitrate 60 MG 24 hr tablet Commonly known as:  IMDUR Take 1 tablet (60 mg total) by mouth daily.   lactulose 10 GM/15ML solution Commonly known as:  CHRONULAC Take 10 g by mouth daily.   levETIRAcetam 500 MG tablet Commonly known as:  KEPPRA Take 1 tablet (500 mg total) by mouth every  morning.   lisinopril 40 MG tablet Commonly known as:  PRINIVIL,ZESTRIL Take 40 mg by mouth daily.   metoCLOPramide 10 MG tablet Commonly known as:  REGLAN Take 1 tablet (10 mg total) by mouth 4 (four) times daily.   metoprolol 50 MG tablet Commonly known as:  LOPRESSOR Take 1 tablet (50 mg total) by mouth 2 (two) times daily.   mupirocin ointment 2 % Commonly known as:  BACTROBAN Place 1 application into the nose 2 (two) times daily.   nicotine 14 mg/24hr patch Commonly known as:  NICODERM CQ - dosed in mg/24 hours Place 14 mg onto the skin daily.   nitroGLYCERIN 0.4 MG SL tablet Commonly known as:  NITROSTAT Place 0.4 mg under the tongue every 5 (five) minutes as needed for chest pain. Reported on 07/10/2015   NOVOLOG FLEXPEN 100 UNIT/ML FlexPen Generic drug:  insulin aspart Inject 0-10 Units into the skin 3 (three) times daily with meals. Inject per sliding  scale: If 0-150 = 0    151-200 = 2 units   201-250 = 4 units   251-300 = 6 units    301-350 = 8 units    351-400 = 10 units  Anything above 400, call MD   nystatin powder Commonly known as:  MYCOSTATIN/NYSTOP Apply 1 g topically 2 (two) times daily.   omeprazole 20 MG capsule Commonly known as:  PRILOSEC Take 20 mg by mouth daily before breakfast.   ondansetron 4 MG disintegrating tablet Commonly known as:  ZOFRAN ODT Take 1 tablet (4 mg total) by mouth every 6 (six) hours as needed for nausea or vomiting.   oxyCODONE-acetaminophen 5-325 MG tablet Commonly known as:  PERCOCET/ROXICET Take 1 tablet by mouth every 6 (six) hours as needed for moderate pain.   polyethylene glycol packet Commonly known as:  MIRALAX / GLYCOLAX Take 17 g by mouth daily.   ranolazine 500 MG 12 hr tablet Commonly known as:  RANEXA Take 1 tablet (500 mg total) by mouth 2 (two) times daily.   risperiDONE 1 MG tablet Commonly known as:  RISPERDAL Take 1 mg by mouth 3 (three) times daily.   senna 8.6 MG Tabs tablet Commonly known  as:  SENOKOT Take 2 tablets by mouth 2 (two) times daily.   torsemide 100 MG tablet Commonly known as:  DEMADEX Take 100 mg by mouth daily.   XEROFORM PETROLATUM DRESSING Pads Apply 1 each topically every 3 (three) days.       Review of Systems  Constitutional: Negative for activity change, appetite change, chills, fatigue and fever.  HENT: Negative for congestion, rhinorrhea, sinus pressure, sneezing and sore throat.   Eyes: Negative.   Respiratory: Negative for cough, shortness of breath and wheezing.   Gastrointestinal: Negative for abdominal distention, abdominal pain, constipation, diarrhea, nausea and vomiting.  Endocrine: Negative.   Genitourinary:       ESRD on dialysis   Musculoskeletal: Positive for gait problem.  Skin: Negative for color change, pallor and rash.  Neurological: Negative for dizziness, seizures, syncope, light-headedness and headaches.  Psychiatric/Behavioral: Negative for agitation, confusion, hallucinations and sleep disturbance. The patient is not nervous/anxious.     There is no immunization history for the selected administration types on file for this patient. Pertinent  Health Maintenance Due  Topic Date Due  . FOOT EXAM  10/03/1973  . OPHTHALMOLOGY EXAM  10/03/1973  . PAP SMEAR  10/03/1984  . MAMMOGRAM  10/03/2013  . COLONOSCOPY  10/03/2013  . INFLUENZA VACCINE  10/31/2015  . HEMOGLOBIN A1C  03/14/2016   No flowsheet data found. Functional Status Survey:    Vitals:   01/11/16 1427  BP: 136/74  Pulse: 78  Resp: 20  Temp: 97.4 F (36.3 C)  TempSrc: Oral  SpO2: 98%  Weight: 257 lb 4.8 oz (116.7 kg)  Height: 5\' 9"  (1.753 m)   Body mass index is 38 kg/m. Physical Exam  Constitutional: She is oriented to person, place, and time. She appears well-developed. No distress.  Obese   HENT:  Head: Normocephalic.  Mouth/Throat: Oropharynx is clear and moist. No oropharyngeal exudate.  Eyes: Conjunctivae and EOM are normal. Pupils are  equal, round, and reactive to light. No scleral icterus.  Neck: Normal range of motion. No JVD present. No thyromegaly present.  Cardiovascular: Normal rate, regular rhythm, normal heart sounds and intact distal pulses.  Exam reveals no gallop and no friction rub.   No murmur heard. Pulmonary/Chest: Effort normal and breath sounds normal. No respiratory  distress. She has no wheezes. She has no rales.  Abdominal: Soft. Bowel sounds are normal. She exhibits no distension. There is no tenderness. There is no rebound and no guarding.  Musculoskeletal: She exhibits no tenderness.  Right BKA. Left leg 1-2 + edema.   Lymphadenopathy:    She has no cervical adenopathy.  Neurological: She is oriented to person, place, and time.  Skin: Skin is warm and dry. No rash noted. No erythema. No pallor.  Psychiatric: She has a normal mood and affect.    Labs reviewed:  Recent Labs  05/14/15 0547  07/21/15 0427  11/19/15 1547  01/02/16 1041 01/04/16 0407 01/04/16 1055 01/10/16  NA 140  < > 128*  < >  --   < > 130* 132* 130* 138  K 4.7  < > 4.2  < >  --   < > 4.6 3.7 3.9 3.5  CL 109  < > 91*  < >  --   < > 92* 94* 91*  --   CO2 22  < > 22  < >  --   < > 32 32 29  --   GLUCOSE 181*  < > 157*  < >  --   < > 143* 118* 147*  --   BUN 64*  < > 62*  < >  --   < > 71* 48* 51* 21  CREATININE 7.10*  < > 9.89*  < >  --   < > 6.94* 5.52* 5.58* 3.4*  CALCIUM 7.8*  < > 5.9*  < >  --   < > 10.9* 10.5* 10.5*  --   MG 2.4  --  1.9  --  1.9  --   --   --   --   --   PHOS 8.0*  8.0*  < >  --   < >  --   < > 4.4 3.3 3.5  --   < > = values in this interval not displayed.  Recent Labs  07/29/15 0424 08/23/15 2319  12/25/15 1434 01/02/16 1041 01/04/16 0407 01/04/16 1055 01/10/16  AST 18 13*  --  17  --   --   --  7*  ALT 12* 7*  --  9*  --   --   --   --   ALKPHOS 77 105  --  66  --   --   --  80  BILITOT 0.5 0.6  --  0.5  --   --   --   --   PROT 4.9* 5.4*  --  5.0*  --   --   --   --   ALBUMIN 1.8* 1.5*   < > 2.2* 1.8* 1.9* 1.9*  --   < > = values in this interval not displayed.  Recent Labs  12/05/15 1821 12/07/15 0800  12/25/15 1434  01/02/16 1041 01/03/16 0424 01/04/16 1055 01/10/16  WBC 4.4 6.4  < > 11.0  < > 8.4 11.5* 12.4* 10.9  NEUTROABS 3.3 4.1  --  9.6*  --   --   --   --   --   HGB 9.8* 9.4*  < > 7.7*  < > 6.5* 8.3* 7.9* 7.8*  HCT 29.1* 27.5*  < > 23.5*  < > 19.1* 24.8* 23.8* 25*  MCV 97.1 96.8  < > 95.2  < > 89.8 89.6 89.5  --   PLT 233 204  < > 226  < > 419 430  496* 495*  < > = values in this interval not displayed. Lab Results  Component Value Date   TSH 8.570 (H) 11/19/2015   Lab Results  Component Value Date   HGBA1C 6.2 (H) 09/13/2015   Lab Results  Component Value Date   CHOL 215 (H) 11/19/2015   HDL 63 11/19/2015   LDLCALC 115 (H) 11/19/2015   TRIG 186 (H) 11/19/2015   CHOLHDL 3.4 11/19/2015      Assessment/Plan Leukocytosis  WBC 10.9 ( 01/10/2016) previous 11.5. Trending down will continue to monitor. CBC and Temp curve. CBC/diff 01/18/2016.   Anemia  Hgb 7.8 ( 01/10/2016) previous 7.9. Chronic stable possible ESRD. Continue to monitor.   Edema  Left leg 1-2 +  Start knee high Ted hose on in the morning and off at bedtime.    Family/ staff Communication: Reviewed plan of care with patient and Nurse supervisor.   Labs/tests ordered: CBC/diff 01/18/2016.

## 2016-01-18 LAB — CBC AND DIFFERENTIAL
HEMATOCRIT: 25 % — AB (ref 36–46)
HEMOGLOBIN: 7.7 g/dL — AB (ref 12.0–16.0)
Platelets: 431 10*3/uL — AB (ref 150–399)
WBC: 6.6 10^3/mL

## 2016-01-19 ENCOUNTER — Encounter (HOSPITAL_COMMUNITY): Payer: Self-pay | Admitting: Emergency Medicine

## 2016-01-19 ENCOUNTER — Emergency Department (HOSPITAL_COMMUNITY): Payer: Medicare HMO

## 2016-01-19 ENCOUNTER — Observation Stay (HOSPITAL_COMMUNITY)
Admission: EM | Admit: 2016-01-19 | Discharge: 2016-01-20 | Disposition: A | Discharge status: Home / Self Care | Payer: Medicare HMO | Attending: Internal Medicine | Admitting: Internal Medicine

## 2016-01-19 DIAGNOSIS — Z955 Presence of coronary angioplasty implant and graft: Secondary | ICD-10-CM | POA: Diagnosis not present

## 2016-01-19 DIAGNOSIS — Z7902 Long term (current) use of antithrombotics/antiplatelets: Secondary | ICD-10-CM | POA: Diagnosis not present

## 2016-01-19 DIAGNOSIS — N186 End stage renal disease: Secondary | ICD-10-CM | POA: Insufficient documentation

## 2016-01-19 DIAGNOSIS — J45909 Unspecified asthma, uncomplicated: Secondary | ICD-10-CM | POA: Diagnosis not present

## 2016-01-19 DIAGNOSIS — Z992 Dependence on renal dialysis: Secondary | ICD-10-CM | POA: Diagnosis not present

## 2016-01-19 DIAGNOSIS — I25110 Atherosclerotic heart disease of native coronary artery with unstable angina pectoris: Secondary | ICD-10-CM

## 2016-01-19 DIAGNOSIS — I10 Essential (primary) hypertension: Secondary | ICD-10-CM | POA: Diagnosis present

## 2016-01-19 DIAGNOSIS — I25119 Atherosclerotic heart disease of native coronary artery with unspecified angina pectoris: Secondary | ICD-10-CM

## 2016-01-19 DIAGNOSIS — Z794 Long term (current) use of insulin: Secondary | ICD-10-CM | POA: Insufficient documentation

## 2016-01-19 DIAGNOSIS — E785 Hyperlipidemia, unspecified: Secondary | ICD-10-CM

## 2016-01-19 DIAGNOSIS — Z79899 Other long term (current) drug therapy: Secondary | ICD-10-CM | POA: Diagnosis not present

## 2016-01-19 DIAGNOSIS — Z87891 Personal history of nicotine dependence: Secondary | ICD-10-CM | POA: Insufficient documentation

## 2016-01-19 DIAGNOSIS — I252 Old myocardial infarction: Secondary | ICD-10-CM | POA: Insufficient documentation

## 2016-01-19 DIAGNOSIS — E119 Type 2 diabetes mellitus without complications: Secondary | ICD-10-CM | POA: Diagnosis not present

## 2016-01-19 DIAGNOSIS — I509 Heart failure, unspecified: Secondary | ICD-10-CM | POA: Diagnosis not present

## 2016-01-19 DIAGNOSIS — I2510 Atherosclerotic heart disease of native coronary artery without angina pectoris: Secondary | ICD-10-CM | POA: Insufficient documentation

## 2016-01-19 DIAGNOSIS — R079 Chest pain, unspecified: Principal | ICD-10-CM | POA: Insufficient documentation

## 2016-01-19 DIAGNOSIS — I132 Hypertensive heart and chronic kidney disease with heart failure and with stage 5 chronic kidney disease, or end stage renal disease: Secondary | ICD-10-CM | POA: Diagnosis not present

## 2016-01-19 DIAGNOSIS — L98429 Non-pressure chronic ulcer of back with unspecified severity: Secondary | ICD-10-CM | POA: Insufficient documentation

## 2016-01-19 HISTORY — DX: Depression, unspecified: F32.A

## 2016-01-19 HISTORY — DX: Type 2 diabetes mellitus without complications: E11.9

## 2016-01-19 HISTORY — DX: End stage renal disease: N18.6

## 2016-01-19 HISTORY — DX: Bipolar disorder, unspecified: F31.9

## 2016-01-19 HISTORY — DX: Major depressive disorder, single episode, unspecified: F32.9

## 2016-01-19 HISTORY — DX: Migraine, unspecified, not intractable, without status migrainosus: G43.909

## 2016-01-19 HISTORY — DX: Other chronic pain: G89.29

## 2016-01-19 HISTORY — DX: Angina pectoris, unspecified: I20.9

## 2016-01-19 HISTORY — DX: Low back pain: M54.5

## 2016-01-19 HISTORY — DX: Dependence on renal dialysis: Z99.2

## 2016-01-19 HISTORY — DX: Low back pain, unspecified: M54.50

## 2016-01-19 HISTORY — DX: Anxiety disorder, unspecified: F41.9

## 2016-01-19 HISTORY — DX: Unspecified convulsions: R56.9

## 2016-01-19 HISTORY — DX: Personal history of other medical treatment: Z92.89

## 2016-01-19 HISTORY — DX: Personal history of other diseases of the digestive system: Z87.19

## 2016-01-19 HISTORY — DX: Acute myocardial infarction, unspecified: I21.9

## 2016-01-19 LAB — CBC WITH DIFFERENTIAL/PLATELET
Basophils Absolute: 0 10*3/uL (ref 0.0–0.1)
Basophils Relative: 0 %
EOS ABS: 0.1 10*3/uL (ref 0.0–0.7)
Eosinophils Relative: 2 %
HCT: 25.8 % — ABNORMAL LOW (ref 36.0–46.0)
Hemoglobin: 7.8 g/dL — ABNORMAL LOW (ref 12.0–15.0)
LYMPHS ABS: 1.6 10*3/uL (ref 0.7–4.0)
LYMPHS PCT: 21 %
MCH: 27.4 pg (ref 26.0–34.0)
MCHC: 30.2 g/dL (ref 30.0–36.0)
MCV: 90.5 fL (ref 78.0–100.0)
MONO ABS: 0.6 10*3/uL (ref 0.1–1.0)
Monocytes Relative: 8 %
Neutro Abs: 5.3 10*3/uL (ref 1.7–7.7)
Neutrophils Relative %: 69 %
PLATELETS: 448 10*3/uL — AB (ref 150–400)
RBC: 2.85 MIL/uL — AB (ref 3.87–5.11)
RDW: 14.6 % (ref 11.5–15.5)
WBC: 7.6 10*3/uL (ref 4.0–10.5)

## 2016-01-19 LAB — BASIC METABOLIC PANEL
Anion gap: 7 (ref 5–15)
BUN: 20 mg/dL (ref 6–20)
CALCIUM: 8.7 mg/dL — AB (ref 8.9–10.3)
CO2: 31 mmol/L (ref 22–32)
CREATININE: 3.15 mg/dL — AB (ref 0.44–1.00)
Chloride: 98 mmol/L — ABNORMAL LOW (ref 101–111)
GFR calc Af Amer: 18 mL/min — ABNORMAL LOW (ref >60)
GFR, EST NON AFRICAN AMERICAN: 16 mL/min — AB (ref >60)
GLUCOSE: 169 mg/dL — AB (ref 65–99)
Potassium: 3.1 mmol/L — ABNORMAL LOW (ref 3.5–5.1)
SODIUM: 136 mmol/L (ref 135–145)

## 2016-01-19 LAB — TROPONIN I
TROPONIN I: 0.04 ng/mL — AB (ref <0.03)
TROPONIN I: 0.04 ng/mL — AB (ref <0.03)
TROPONIN I: 0.03 ng/mL — AB (ref <0.03)
Troponin I: 0.04 ng/mL (ref <0.03)

## 2016-01-19 LAB — I-STAT TROPONIN, ED: TROPONIN I, POC: 0.01 ng/mL (ref 0.00–0.08)

## 2016-01-19 LAB — GLUCOSE, CAPILLARY
GLUCOSE-CAPILLARY: 151 mg/dL — AB (ref 65–99)
Glucose-Capillary: 95 mg/dL (ref 65–99)
Glucose-Capillary: 88 mg/dL (ref 65–99)
Glucose-Capillary: 131 mg/dL — ABNORMAL HIGH (ref 65–99)

## 2016-01-19 MED ORDER — OXYCODONE-ACETAMINOPHEN 5-325 MG PO TABS
1.0000 | ORAL_TABLET | Freq: Four times a day (QID) | ORAL | Status: DC | PRN
  Administered 2016-01-19 (×3): 1 via ORAL
  Filled 2016-01-19 (×3): qty 1

## 2016-01-19 MED ORDER — MORPHINE SULFATE (PF) 2 MG/ML IV SOLN
2.0000 mg | INTRAVENOUS | Status: DC | PRN
  Administered 2016-01-19 – 2016-01-20 (×7): 2 mg via INTRAVENOUS
  Filled 2016-01-19 (×6): qty 1

## 2016-01-19 MED ORDER — NYSTATIN 100000 UNIT/GM EX POWD
1.0000 g | Freq: Two times a day (BID) | CUTANEOUS | Status: DC
  Administered 2016-01-20 (×2): 1 g via TOPICAL
  Filled 2016-01-19: qty 15

## 2016-01-19 MED ORDER — SENNA 8.6 MG PO TABS
2.0000 | ORAL_TABLET | Freq: Two times a day (BID) | ORAL | Status: DC
  Filled 2016-01-19 (×2): qty 2

## 2016-01-19 MED ORDER — RISPERIDONE 1 MG PO TABS
1.0000 mg | ORAL_TABLET | Freq: Three times a day (TID) | ORAL | Status: DC
  Administered 2016-01-19 – 2016-01-20 (×4): 1 mg via ORAL
  Filled 2016-01-19 (×6): qty 1

## 2016-01-19 MED ORDER — ONDANSETRON 4 MG PO TBDP: 4.0000 mg | ORAL_TABLET | Freq: Four times a day (QID) | ORAL | Status: DC | PRN

## 2016-01-19 MED ORDER — MUPIROCIN 2 % EX OINT
1.0000 | TOPICAL_OINTMENT | Freq: Two times a day (BID) | CUTANEOUS | Status: DC
Start: 2016-01-19 — End: 2016-01-20
  Administered 2016-01-19 – 2016-01-20 (×2): 1 via NASAL
  Filled 2016-01-19: qty 22

## 2016-01-19 MED ORDER — ACETAMINOPHEN 325 MG PO TABS: 650.0000 mg | ORAL_TABLET | ORAL | Status: DC | PRN

## 2016-01-19 MED ORDER — LACTULOSE 10 GM/15ML PO SOLN
10.0000 g | Freq: Every day | ORAL | Status: DC
  Administered 2016-01-20: 10 g via ORAL
  Filled 2016-01-19 (×2): qty 15

## 2016-01-19 MED ORDER — INSULIN ASPART 100 UNIT/ML ~~LOC~~ SOLN
0.0000 [IU] | SUBCUTANEOUS | Status: DC
  Administered 2016-01-19 – 2016-01-20 (×3): 1 [IU] via SUBCUTANEOUS

## 2016-01-19 MED ORDER — LISINOPRIL 40 MG PO TABS
40.0000 mg | ORAL_TABLET | Freq: Every day | ORAL | Status: DC
  Administered 2016-01-19: 40 mg via ORAL
  Filled 2016-01-19: qty 1

## 2016-01-19 MED ORDER — LEVETIRACETAM 500 MG PO TABS
500.0000 mg | ORAL_TABLET | Freq: Every morning | ORAL | Status: DC
  Administered 2016-01-19 – 2016-01-20 (×2): 500 mg via ORAL
  Filled 2016-01-19 (×2): qty 1

## 2016-01-19 MED ORDER — PRO-STAT SUGAR FREE PO LIQD
30.0000 mL | Freq: Three times a day (TID) | ORAL | Status: DC
  Administered 2016-01-19 – 2016-01-20 (×4): 30 mL via ORAL
  Filled 2016-01-19 (×6): qty 30

## 2016-01-19 MED ORDER — COLESEVELAM HCL 625 MG PO TABS
625.0000 mg | ORAL_TABLET | Freq: Two times a day (BID) | ORAL | Status: DC
  Administered 2016-01-19 – 2016-01-20 (×2): 625 mg via ORAL
  Filled 2016-01-19 (×4): qty 1

## 2016-01-19 MED ORDER — CALCIUM CARBONATE ANTACID 500 MG PO CHEW
1.0000 | CHEWABLE_TABLET | Freq: Three times a day (TID) | ORAL | Status: DC
  Administered 2016-01-19 – 2016-01-20 (×4): 200 mg via ORAL
  Filled 2016-01-19 (×6): qty 1

## 2016-01-19 MED ORDER — ONDANSETRON HCL 4 MG/2ML IJ SOLN: 4.0000 mg | Freq: Four times a day (QID) | INTRAMUSCULAR | Status: DC | PRN

## 2016-01-19 MED ORDER — MORPHINE SULFATE (PF) 4 MG/ML IV SOLN
4.0000 mg | Freq: Once | INTRAVENOUS | Status: AC
  Administered 2016-01-19: 4 mg via INTRAVENOUS
  Filled 2016-01-19: qty 1

## 2016-01-19 MED ORDER — ASPIRIN 81 MG PO CHEW: 324.0000 mg | CHEWABLE_TABLET | Freq: Once | ORAL | Status: DC

## 2016-01-19 MED ORDER — TORSEMIDE 20 MG PO TABS
100.0000 mg | ORAL_TABLET | Freq: Every day | ORAL | Status: DC
  Administered 2016-01-19 – 2016-01-20 (×2): 100 mg via ORAL
  Filled 2016-01-19 (×2): qty 5

## 2016-01-19 MED ORDER — HYDRALAZINE HCL 50 MG PO TABS
25.0000 mg | ORAL_TABLET | Freq: Every day | ORAL | Status: DC
  Administered 2016-01-19: 25 mg via ORAL
  Filled 2016-01-19: qty 1

## 2016-01-19 MED ORDER — CLOPIDOGREL BISULFATE 75 MG PO TABS
75.0000 mg | ORAL_TABLET | Freq: Every day | ORAL | Status: DC
  Administered 2016-01-19 – 2016-01-20 (×2): 75 mg via ORAL
  Filled 2016-01-19 (×2): qty 1

## 2016-01-19 MED ORDER — METOPROLOL TARTRATE 50 MG PO TABS
50.0000 mg | ORAL_TABLET | Freq: Two times a day (BID) | ORAL | Status: DC
  Administered 2016-01-19 – 2016-01-20 (×3): 50 mg via ORAL
  Filled 2016-01-19 (×3): qty 1

## 2016-01-19 MED ORDER — POLYETHYLENE GLYCOL 3350 17 G PO PACK
17.0000 g | PACK | Freq: Every day | ORAL | Status: DC
Start: 2016-01-19 — End: 2016-01-20
  Filled 2016-01-19 (×2): qty 1

## 2016-01-19 MED ORDER — RANOLAZINE ER 500 MG PO TB12
500.0000 mg | ORAL_TABLET | Freq: Two times a day (BID) | ORAL | Status: DC
  Administered 2016-01-19 – 2016-01-20 (×3): 500 mg via ORAL
  Filled 2016-01-19 (×3): qty 1

## 2016-01-19 MED ORDER — METOCLOPRAMIDE HCL 10 MG PO TABS
10.0000 mg | ORAL_TABLET | Freq: Four times a day (QID) | ORAL | Status: DC
  Administered 2016-01-19 – 2016-01-20 (×5): 10 mg via ORAL
  Filled 2016-01-19 (×5): qty 1

## 2016-01-19 MED ORDER — PANTOPRAZOLE SODIUM 40 MG PO TBEC
40.0000 mg | DELAYED_RELEASE_TABLET | Freq: Every day | ORAL | Status: DC
  Administered 2016-01-19 – 2016-01-20 (×2): 40 mg via ORAL
  Filled 2016-01-19 (×2): qty 1

## 2016-01-19 MED ORDER — XEROFORM PETROLATUM DRESSING EX PADS: 1.0000 | MEDICATED_PAD | CUTANEOUS | Status: DC

## 2016-01-19 MED ORDER — ISOSORBIDE MONONITRATE ER 60 MG PO TB24
60.0000 mg | ORAL_TABLET | Freq: Every day | ORAL | Status: DC
  Administered 2016-01-19 – 2016-01-20 (×2): 60 mg via ORAL
  Filled 2016-01-19 (×2): qty 1

## 2016-01-19 MED ORDER — NICOTINE 14 MG/24HR TD PT24
14.0000 mg | MEDICATED_PATCH | Freq: Every day | TRANSDERMAL | Status: DC
Start: 2016-01-19 — End: 2016-01-20
  Administered 2016-01-19 – 2016-01-20 (×2): 14 mg via TRANSDERMAL
  Filled 2016-01-19 (×2): qty 1

## 2016-01-19 MED ORDER — NITROGLYCERIN 0.4 MG SL SUBL: 0.4000 mg | SUBLINGUAL_TABLET | SUBLINGUAL | Status: DC | PRN

## 2016-01-19 MED ORDER — HEPARIN SODIUM (PORCINE) 5000 UNIT/ML IJ SOLN
5000.0000 [IU] | Freq: Three times a day (TID) | INTRAMUSCULAR | Status: DC
  Administered 2016-01-19 – 2016-01-20 (×3): 5000 [IU] via SUBCUTANEOUS
  Filled 2016-01-19 (×2): qty 1

## 2016-01-19 NOTE — ED Notes (Signed)
Dr. Julian Reil paged regarding patient's unrelieved chest pain being a barrier to floor admission.  Informed MD of percocet administration for mild chest pain.  Instructions given to reassess pain in appropriate amount of time.  Will inform Dr. Julian Reil of response to medication.

## 2016-01-19 NOTE — Progress Notes (Signed)
Patient seen and examined. Admitted after midnight secondary to CP. Patient reported intermittent episodes of CP over the last 2 months. Had cath in August 2017, which demonstrated patent stent and 50% CAD stenosis otherwise; patient has been admitted at [facility] for CP since then and cardiology recommended medical management only. Patient continue to have CP here. Troponin flatly elevated at 0.04 (unclear utility in someone with ESRD at that level). Hemodynamically stable otherwise. Please refer to H&P written by Dr. Julian Reil for further info/details on admission.   Plan: -complete troponin cycling -cardiology consulted -renal consulted for HD (normally T-T-S) -continue PPI -continue home heart medication (including plavix, Imdur, hydralazine, lisinopril, lopressor,  and ranexa) -will follow recommendations and clinical response -hopefully back to SNF in 24-48 hours.  Kimberly Montgomery [phone]

## 2016-01-19 NOTE — Clinical Social Work Note (Signed)
Clinical Social Work Assessment  Patient Details  Name: Kimberly Montgomery MRN: [id number] Date of Birth: Feb 04, 1966  Date of referral:  01/19/16               Reason for consult:  Facility Placement, Discharge Planning                Permission sought to share information with:  Chartered certified accountant granted to share information::  Yes, Verbal Permission Granted  Name::        Agency::  [facility]  Relationship::     Contact Information:     Housing/Transportation Living arrangements for the past 2 months:  [place] of Information:  Patient, Medical Team Patient Interpreter Needed:  None Criminal Activity/Legal Involvement Pertinent to Current Situation/Hospitalization:  No - Comment as needed Significant Relationships:  Adult Children Lives with:  Facility Resident Do you feel safe going back to the place where you live?  Yes Need for family participation in patient care:  Yes (Comment)  Care giving concerns:  Patient is a long-term resident at [facility].   Social Worker assessment / plan:  CSW met with patient. No supports at bedside. CSW introduced role and explained that discharge planning would be discussed. Patient confirmed that she came from [facility] and the plan is to return once discharged. No further concerns. CSW encouraged patient to contact CSW as needed. CSW will continue to follow patient for support and facilitate discharge to SNF once medically stable.  Employment status:  Disabled (Comment on whether or not currently receiving Disability) Insurance information:  Managed Medicare PT Recommendations:  Not assessed at this time Information / Referral to community resources:  [place]  Patient/Family's Response to care:  Patient agreeable to return to SNF. [name] supportive and involved in patient's care. Patient appreciated social work intervention.  Patient/Family's Understanding of  and Emotional Response to Diagnosis, Current Treatment, and Prognosis:  Patient understands that she will return to SNF once discharged. Patient appears happy with hospital care.  Emotional Assessment Appearance:  Appears stated age Attitude/Demeanor/Rapport:  Other (Pleasant) Affect (typically observed):  Accepting, Appropriate, Calm, Pleasant Orientation:  Oriented to Self, Oriented to Place, Oriented to  Time, Oriented to Situation Alcohol / Substance use:  Never Used Psych involvement (Current and /or in the community):  No (Comment)  Discharge Needs  Concerns to be addressed:  Care Coordination Readmission within the last 30 days:  Yes Current discharge risk:  Dependent with Mobility, Psychiatric Illness Barriers to Discharge:  No Barriers Identified   Candie Chroman, LCSW 01/19/2016, 12:58 PM

## 2016-01-19 NOTE — Care Management Obs Status (Signed)
MEDICARE OBSERVATION STATUS NOTIFICATION   Patient Details  Name: SHADAVA CLARIZIO MRN: [id number] Date of Birth: 28-Jun-1965   Medicare Observation Status Notification Given:  Yes    Marieme Mcmackin, Annamarie Major, RN 01/19/2016, 1:28 PM

## 2016-01-19 NOTE — H&P (Signed)
History and Physical    Kimberly Montgomery TKZ:[id number] DOB: 1965-09-05 DOA: 01/19/2016   PCP: Jarome Matin, MD Chief Complaint:  Chief Complaint  Patient presents with  . Chest Pain    HPI: Kimberly Montgomery is a 50 y.o. female with medical history significant of ESRD dialysis TTS, CAD s/p DES to LAD in June of this year.  Re-cathed for chest pain in Aug of this year which revealed patent stent with only ~35% stenosis.  Does have disease of other vessels no more than 50% stenosis (see cath in Aug on Epic for specific coronary anatomy). Had recurrent chest pain earlier this month and discharged on October 5th after a hospital visit where cards recommended no further work up.    Patient presents to the ED with c/o chest pain.  Located on left side with radiation to L arm.  Symptoms onset at 8pm.  Associated SOB, occasional nausea.  Pain is of "crushing" quality.  Nothing makes worse, NTG and morphine make better.  ED Course: Trop negative, EKG unremarkable today.  Review of Systems: As per HPI otherwise 10 point review of systems negative.    Past Medical History:  Diagnosis Date  . Asthma   . CAD (coronary artery disease)   . CHF (congestive heart failure) (HCC)   . Diabetes mellitus without complication (HCC)   . Endometriosis   . ESRD (end stage renal disease) (HCC)   . Gastroparesis   . GERD (gastroesophageal reflux disease)   . HLD (hyperlipidemia)   . Hypertension   . Renal disorder     Past Surgical History:  Procedure Laterality Date  . BELOW KNEE LEG AMPUTATION    . CARDIAC CATHETERIZATION Right 07/10/2015   Procedure: Left Heart Cath and Coronary Angiography;  Surgeon: Laurier Nancy, MD;  Location: [facility];  Service: Cardiovascular;  Laterality: Right;  . CARDIAC CATHETERIZATION Right 09/14/2015   Procedure: Left Heart Cath and Coronary Angiography;  Surgeon: Laurier Nancy, MD;  Location: [facility];  Service: Cardiovascular;  Laterality:  Right;  . CARDIAC CATHETERIZATION N/A 09/14/2015   Procedure: Coronary Stent Intervention;  Surgeon: Alwyn Pea, MD;  Location: [facility];  Service: Cardiovascular;  Laterality: N/A;  . CARDIAC CATHETERIZATION Right 11/20/2015   Procedure: Left Heart Cath and Coronary Angiography;  Surgeon: Laurier Nancy, MD;  Location: [facility];  Service: Cardiovascular;  Laterality: Right;  . hd fistula surgery with reversal    . HYSTEROTOMY    . pd tube inserstion    . PERIPHERAL VASCULAR CATHETERIZATION N/A 08/30/2015   Procedure: Dialysis/Perma Catheter Insertion;  Surgeon: Annice Needy, MD;  Location: [facility];  Service: Cardiovascular;  Laterality: N/A;  . PERIPHERAL VASCULAR CATHETERIZATION N/A 01/01/2016   Procedure: Dialysis/Perma Catheter Insertion;  Surgeon: Annice Needy, MD;  Location: [facility];  Service: Cardiovascular;  Laterality: N/A;     reports that she has quit smoking. Her smoking use included Cigarettes. She smoked 0.50 packs per day. She has never used smokeless tobacco. She reports that she does not drink alcohol or use drugs.  Allergies  Allergen Reactions  . Cephalosporins Anaphylaxis    Patient has tolerated meropenem.   [name] Penicillins Anaphylaxis and Other (See Comments)    Has patient had a PCN reaction causing immediate rash, facial/tongue/throat swelling, SOB or lightheadedness with hypotension: Yes Has patient had a PCN reaction causing severe rash involving mucus membranes or skin necrosis: No  Has patient had a PCN reaction that required hospitalization No Has patient had a PCN reaction occurring within the last 10 years: No If all of the above answers are "NO", then may proceed with Cephalosporin use.  . Lamictal [Lamotrigine] Other (See Comments)    Reaction:  Hallucinations  . Phenergan [Promethazine Hcl] Nausea And Vomiting  . Pravastatin Other (See Comments)    Reaction:  Muscle pain   . Sulfa Antibiotics Other (See  Comments)    Reaction:  Unknown     Family History  Problem Relation Age of Onset  . CAD    . Diabetes    . Bipolar disorder    . Cervical cancer Mother       Prior to Admission medications   Medication Sig Start Date End Date Taking? Authorizing Provider  Amino Acids-Protein Hydrolys (FEEDING SUPPLEMENT, PRO-STAT SUGAR FREE 64,) LIQD Take 30 mLs by mouth 3 (three) times daily with meals.   Yes Historical Provider, MD  Bismuth Tribromoph-Petrolatum (XEROFORM PETROLATUM DRESSING) PADS Apply 1 each topically every 3 (three) days.   Yes Historical Provider, MD  calcium carbonate (TUMS - DOSED IN MG ELEMENTAL CALCIUM) 500 MG chewable tablet Chew 1 tablet (200 mg of elemental calcium total) by mouth 3 (three) times daily with meals. 08/02/15  Yes Adrian SaranSital Mody, MD  clopidogrel (PLAVIX) 75 MG tablet Take 75 mg by mouth daily.   Yes Historical Provider, MD  colesevelam (WELCHOL) 625 MG tablet Take 1 tablet (625 mg total) by mouth 2 (two) times daily with a meal. 11/21/15  Yes Katharina Caperima Vaickute, MD  hydrALAZINE (APRESOLINE) 25 MG tablet Take 25 mg by mouth daily.    Yes Historical Provider, MD  insulin glargine (LANTUS) 100 UNIT/ML injection Inject 0.08 mLs (8 Units total) into the skin at bedtime. 08/30/15  Yes Srikar Sudini, MD  isosorbide mononitrate (IMDUR) 60 MG 24 hr tablet Take 1 tablet (60 mg total) by mouth daily. 07/11/15  Yes Auburn BilberryShreyang Patel, MD  lactulose (CHRONULAC) 10 GM/15ML solution Take 10 g by mouth daily.    Yes Historical Provider, MD  levETIRAcetam (KEPPRA) 500 MG tablet Take 1 tablet (500 mg total) by mouth every morning. 08/02/15  Yes Sital Mody, MD  lisinopril (PRINIVIL,ZESTRIL) 40 MG tablet Take 40 mg by mouth daily. 06/26/15  Yes Historical Provider, MD  metoCLOPramide (REGLAN) 10 MG tablet Take 1 tablet (10 mg total) by mouth 4 (four) times daily. 08/02/15  Yes Adrian SaranSital Mody, MD  metoprolol (LOPRESSOR) 50 MG tablet Take 1 tablet (50 mg total) by mouth 2 (two) times daily. 09/15/15  Yes Srikar  Sudini, MD  Multiple Vitamins-Minerals (DECUBI-VITE PO) Take 1 tablet by mouth daily.   Yes Historical Provider, MD  mupirocin ointment (BACTROBAN) 2 % Place 1 application into the nose 2 (two) times daily. 11/21/15  Yes Katharina Caperima Vaickute, MD  nicotine (NICODERM CQ - DOSED IN MG/24 HOURS) 14 mg/24hr patch Place 14 mg onto the skin daily.   Yes Historical Provider, MD  nitroGLYCERIN (NITROSTAT) 0.4 MG SL tablet Place 0.4 mg under the tongue every 5 (five) minutes as needed for chest pain. Reported on 07/10/2015   Yes Historical Provider, MD  nystatin (MYCOSTATIN) powder Apply 1 g topically 2 (two) times daily.   Yes Historical Provider, MD  omeprazole (PRILOSEC) 20 MG capsule Take 20 mg by mouth daily before breakfast.    Yes Historical Provider, MD  ondansetron (ZOFRAN ODT) 4 MG disintegrating tablet Take 1 tablet (4 mg total) by mouth every 6 (six) hours  as needed for nausea or vomiting. 07/16/15  Yes Sharyn Creamer, MD  oxyCODONE-acetaminophen (PERCOCET/ROXICET) 5-325 MG tablet Take 1 tablet by mouth every 6 (six) hours as needed for moderate pain. 01/03/16  Yes Katha Hamming, MD  polyethylene glycol (MIRALAX / GLYCOLAX) packet Take 17 g by mouth daily. 08/02/15  Yes Adrian Saran, MD  ranolazine (RANEXA) 500 MG 12 hr tablet Take 1 tablet (500 mg total) by mouth 2 (two) times daily. 07/11/15  Yes Auburn Bilberry, MD  risperiDONE (RISPERDAL) 1 MG tablet Take 1 mg by mouth 3 (three) times daily.    Yes Historical Provider, MD  senna (SENOKOT) 8.6 MG TABS tablet Take 2 tablets by mouth 2 (two) times daily.    Yes Historical Provider, MD  torsemide (DEMADEX) 100 MG tablet Take 100 mg by mouth daily.   Yes Historical Provider, MD    Physical Exam: Vitals:   01/19/16 0200 01/19/16 0330 01/19/16 0400 01/19/16 0430  BP: (!) 142/53 (!) 125/46 (!) 129/44 (!) 131/49  Pulse: 72 64 63 62  Resp: 15 15 15 16   Temp:      TempSrc:      SpO2: 100% 100% 100% 100%      Constitutional: NAD, calm, comfortable Eyes:  PERRL, lids and conjunctivae normal ENMT: Mucous membranes are moist. Posterior pharynx clear of any exudate or lesions.Normal dentition.  Neck: normal, supple, no masses, no thyromegaly Respiratory: clear to auscultation bilaterally, no wheezing, no crackles. Normal respiratory effort. No accessory muscle use.  Cardiovascular: Regular rate and rhythm, no murmurs / rubs / gallops. No extremity edema. 2+ pedal pulses. No carotid bruits.  Abdomen: no tenderness, no masses palpated. No hepatosplenomegaly. Bowel sounds positive.  Musculoskeletal: no clubbing / cyanosis. No joint deformity upper and lower extremities. Good ROM, no contractures. Normal muscle tone.  Skin: no rashes, lesions, ulcers. No induration Neurologic: CN 2-12 grossly intact. Sensation intact, DTR normal. Strength 5/5 in all 4.  Psychiatric: Normal judgment and insight. Alert and oriented x 3. Normal mood.    Labs on Admission: I have personally reviewed following labs and imaging studies  CBC:  Recent Labs Lab 01/19/16 0122  WBC 7.6  NEUTROABS 5.3  HGB 7.8*  HCT 25.8*  MCV 90.5  PLT 448*   Basic Metabolic Panel:  Recent Labs Lab 01/19/16 0122  NA 136  K 3.1*  CL 98*  CO2 31  GLUCOSE 169*  BUN 20  CREATININE 3.15*  CALCIUM 8.7*   GFR: Estimated Creatinine Clearance: 28.5 mL/min (by C-G formula based on SCr of 3.15 mg/dL (H)). Liver Function Tests: No results for input(s): AST, ALT, ALKPHOS, BILITOT, PROT, ALBUMIN in the last 168 hours. No results for input(s): LIPASE, AMYLASE in the last 168 hours. No results for input(s): AMMONIA in the last 168 hours. Coagulation Profile: No results for input(s): INR, PROTIME in the last 168 hours. Cardiac Enzymes:  Recent Labs Lab 01/19/16 0122  TROPONINI 0.04*   BNP (last 3 results) No results for input(s): PROBNP in the last 8760 hours. HbA1C: No results for input(s): HGBA1C in the last 72 hours. CBG: No results for input(s): GLUCAP in the last 168  hours. Lipid Profile: No results for input(s): CHOL, HDL, LDLCALC, TRIG, CHOLHDL, LDLDIRECT in the last 72 hours. Thyroid Function Tests: No results for input(s): TSH, T4TOTAL, FREET4, T3FREE, THYROIDAB in the last 72 hours. Anemia Panel: No results for input(s): VITAMINB12, FOLATE, FERRITIN, TIBC, IRON, RETICCTPCT in the last 72 hours. Urine analysis:    Component Value Date/Time  COLORURINE YELLOW (A) 12/25/2015 1432   APPEARANCEUR HAZY (A) 12/25/2015 1432   APPEARANCEUR Clear 12/31/2013 1818   LABSPEC 1.019 12/25/2015 1432   LABSPEC 1.009 12/31/2013 1818   PHURINE 5.0 12/25/2015 1432   GLUCOSEU 150 (A) 12/25/2015 1432   GLUCOSEU 150 mg/dL 11/91/4782 9562   HGBUR NEGATIVE 12/25/2015 1432   BILIRUBINUR NEGATIVE 12/25/2015 1432   BILIRUBINUR Negative 12/31/2013 1818   KETONESUR NEGATIVE 12/25/2015 1432   PROTEINUR >500 (A) 12/25/2015 1432   NITRITE NEGATIVE 12/25/2015 1432   LEUKOCYTESUR NEGATIVE 12/25/2015 1432   LEUKOCYTESUR Negative 12/31/2013 1818   Sepsis Labs: @LABRCNTIP (procalcitonin:4,lacticidven:4) )No results found for this or any previous visit (from the past 240 hour(s)).   Radiological Exams on Admission: Dg Chest 2 View  Result Date: 01/19/2016 CLINICAL DATA:  Chest pain and dyspnea since a o'clock. History post dialysis angina. EXAM: CHEST  2 VIEW COMPARISON:  01/02/2016 FINDINGS: Left IJ dialysis catheter tip terminates in the distal SVC. Hazy opacity with obscuration of left hemidiaphragm consistent with small layering effusion. Cardiac silhouette is normal. Atheromatous calcification of the aorta. No aneurysm. Mild vascular congestion. IMPRESSION: Mild vascular congestion with small left pleural effusion. Superimposed infiltrate at the left lung base would be difficult to entirely exclude. Electronically Signed   By: Tollie Eth M.D.   On: 01/19/2016 04:22    EKG: Independently reviewed.  Assessment/Plan Principal Problem:   Chest pain, rule out acute  myocardial infarction Active Problems:   Type 2 diabetes mellitus (HCC)   CAD (coronary artery disease)   ESRD (end stage renal disease) (HCC)   Essential hypertension    1. Chest pain - 1. CP obs pathway 2. Serial trops 3. Tele monitor 4. NPO just in case 5. I suspect however that if trops negative that cards will recommend no-further work up, given the history above. 2. CAD - chronic, probably the cause of her angina 3. DM2 - 1. Sensitive scale SSI q4h 4. HTN - continue home BP meds 5. ESRD - call for dialysis if she stays Saturday   DVT prophylaxis: Heparin [place] Code Status: Full Family Communication: No family in room Consults called: None Admission status: Place in 41, Heywood Iles. DO [facility] Pager [phone] from 7PM-7AM  If 7AM-7PM, please contact the day physician for the patient [email or web address] Password [email or web address]  01/19/2016, 4:52 AM

## 2016-01-19 NOTE — Consult Note (Signed)
[facility] Renal Consultation Note    Indication for Consultation:  Management of ESRD/hemodialysis, anemia, hypertension/volume, and secondary hyperparathyroidism. PCP:  HPI: Kimberly Montgomery is a 50 y.o. female with ESRD (on HD TTS at [facility]), Type 2 DM, obesity, CAD s/p recent NSTEMI who was admitted with chest pain.   Chest pain started last night around 8pm. Took NTG, but pain recurred and was associated with L arm pain and dyspnea prompting ED evaluation. In ED, labs showed K 3.1, troponin 0.04 (cycled and stable), EKG normal, Hgb 7.8. CXR with mild vascular congestion. Currently, chest pain nearly resolved (just mild pressure). No current dyspnea, N/V, diarrhea, abdominal pain, fever or chills.  From renal standpoint, last HD 10/19. She dialyzes TTS schedule. Denies CP while on HD. Does have symptoms of orthopnea and she has significant LE edema. She has been on HD since 07/2015, previously on PD and still has a PD catheter in place. She reports that plan was to eventually change back to PD if she was able to return to home (currently lives in [place]), now she is unclear if this will ever happen. Has not had PD cath flushed regularly.  Grew up in [employer] [place] [employer] family, lived various parts of the world, father was in the [employer]. Ended up in [place], finished HS and started comm college at [employer] in medical coding but didn't finish degree due to illness. Worked at [employer] 30yrs and [employer] 4 yrs. Has son worked for [employer] and a daughter going to school for Sales executive.  Was living alone but after recent hospitalizations has lost mobility and required SNF placement.  Switched from PD to HD since SNF"s don't do PD.  Hasn't made much progress with rehab though, she was 8 mos ago walking with a prosthesis and a cane. She has been admitted 8 times since Feb this year.  Now is a 2-person assist to get up in the [facility].  Has R BKA and L foot neuropathy.   Quit smoking 5 mos ago.  Has 3 cor stents total now per pt.    Past Medical History:  Diagnosis Date  . Asthma   . CAD (coronary artery disease)   . CHF (congestive heart failure) (HCC)   . Diabetes mellitus without complication (HCC)   . Endometriosis   . ESRD (end stage renal disease) (HCC)   . Gastroparesis   . GERD (gastroesophageal reflux disease)   . HLD (hyperlipidemia)   . Hypertension   . Renal disorder    Past Surgical History:  Procedure Laterality Date  . BELOW KNEE LEG AMPUTATION    . CARDIAC CATHETERIZATION Right 07/10/2015   Procedure: Left Heart Cath and Coronary Angiography;  Surgeon: Laurier Nancy, MD;  Location: [facility] INVASIVE CV LAB;  Service: Cardiovascular;  Laterality: Right;  . CARDIAC CATHETERIZATION Right 09/14/2015   Procedure: Left Heart Cath and Coronary Angiography;  Surgeon: Laurier Nancy, MD;  Location: [facility] INVASIVE CV LAB;  Service: Cardiovascular;  Laterality: Right;  . CARDIAC CATHETERIZATION N/A 09/14/2015   Procedure: Coronary Stent Intervention;  Surgeon: Alwyn Pea, MD;  Location: [facility] INVASIVE CV LAB;  Service: Cardiovascular;  Laterality: N/A;  . CARDIAC CATHETERIZATION Right 11/20/2015   Procedure: Left Heart Cath and Coronary Angiography;  Surgeon: Laurier Nancy, MD;  Location: [facility] INVASIVE CV LAB;  Service: Cardiovascular;  Laterality: Right;  . hd fistula surgery with reversal    . HYSTEROTOMY    . pd tube inserstion    .  PERIPHERAL VASCULAR CATHETERIZATION N/A 08/30/2015   Procedure: Dialysis/Perma Catheter Insertion;  Surgeon: Annice Needy, MD;  Location: [facility] INVASIVE CV LAB;  Service: Cardiovascular;  Laterality: N/A;  . PERIPHERAL VASCULAR CATHETERIZATION N/A 01/01/2016   Procedure: Dialysis/Perma Catheter Insertion;  Surgeon: Annice Needy, MD;  Location: [facility] INVASIVE CV LAB;  Service: Cardiovascular;  Laterality: N/A;   Family History  Problem Relation Age of Onset  . CAD    . Diabetes    . Bipolar disorder    . Cervical  cancer Mother    Social History:  reports that she has quit smoking. Her smoking use included Cigarettes. She smoked 0.50 packs per day. She has never used smokeless tobacco. She reports that she does not drink alcohol or use drugs.  ROS: As per HPI otherwise negative.  Physical Exam: Vitals:   01/19/16 0655 01/19/16 0950 01/19/16 1340 01/19/16 1355  BP: (!) 149/60 116/72 (!) 188/75   Pulse: 75 78  88  Resp: 18 17    Temp: 99 F (37.2 C) 98 F (36.7 C)    TempSrc: Oral Oral    SpO2: 99% 98%    Weight:      Height:         General: Obese female; NAD. On nasal oxygen. Head: Normocephalic, atraumatic, sclera non-icteric, mucus membranes are moist. Neck: Supple without lymphadenopathy/masses. JVD not elevated. Lungs: Clear bilaterally to auscultation without wheezes, rales, or rhonchi. Breathing is unlabored. Heart: RRR with normal S1, S2. No murmurs, rubs, or gallops appreciated. Abdomen: Soft, non-tender, non-distended with normoactive bowel sounds. No rebound/guarding. PD cath in place in R lower abdomen. Musculoskeletal:  Strength and tone appear normal for age. Lower extremities: 2+ pitting LLE edema to posterior thigh. R stump with 1+ edema. Neuro: Alert and oriented X 3. Moves all extremities spontaneously. Psych:  Responds to questions appropriately with a normal affect. Dialysis Access: TDC in L chest without erythema/tenderness.  Allergies  Allergen Reactions  . Cephalosporins Anaphylaxis    Patient has tolerated meropenem.   [name] Penicillins Anaphylaxis and Other (See Comments)    Has patient had a PCN reaction causing immediate rash, facial/tongue/throat swelling, SOB or lightheadedness with hypotension: Yes Has patient had a PCN reaction causing severe rash involving mucus membranes or skin necrosis: No Has patient had a PCN reaction that required hospitalization No Has patient had a PCN reaction occurring within the last 10 years: No If all of the above answers are  "NO", then may proceed with Cephalosporin use.  . Lamictal [Lamotrigine] Other (See Comments)    Reaction:  Hallucinations  . Phenergan [Promethazine Hcl] Nausea And Vomiting  . Pravastatin Other (See Comments)    Reaction:  Muscle pain   . Sulfa Antibiotics Other (See Comments)    Reaction:  Unknown    Prior to Admission medications   Medication Sig Start Date End Date Taking? Authorizing Provider  Amino Acids-Protein Hydrolys (FEEDING SUPPLEMENT, PRO-STAT SUGAR FREE 64,) LIQD Take 30 mLs by mouth 3 (three) times daily with meals.   Yes Historical Provider, MD  Bismuth Tribromoph-Petrolatum (XEROFORM PETROLATUM DRESSING) PADS Apply 1 each topically every 3 (three) days.   Yes Historical Provider, MD  calcium carbonate (TUMS - DOSED IN MG ELEMENTAL CALCIUM) 500 MG chewable tablet Chew 1 tablet (200 mg of elemental calcium total) by mouth 3 (three) times daily with meals. 08/02/15  Yes Adrian Saran, MD  clopidogrel (PLAVIX) 75 MG tablet Take 75 mg by mouth daily.   Yes Historical Provider, MD  colesevelam (WELCHOL) 625 MG tablet Take 1 tablet (625 mg total) by mouth 2 (two) times daily with a meal. 11/21/15  Yes Katharina Caperima Vaickute, MD  hydrALAZINE (APRESOLINE) 25 MG tablet Take 25 mg by mouth daily.    Yes Historical Provider, MD  insulin glargine (LANTUS) 100 UNIT/ML injection Inject 0.08 mLs (8 Units total) into the skin at bedtime. 08/30/15  Yes Srikar Sudini, MD  isosorbide mononitrate (IMDUR) 60 MG 24 hr tablet Take 1 tablet (60 mg total) by mouth daily. 07/11/15  Yes Auburn BilberryShreyang Patel, MD  lactulose (CHRONULAC) 10 GM/15ML solution Take 10 g by mouth daily.    Yes Historical Provider, MD  levETIRAcetam (KEPPRA) 500 MG tablet Take 1 tablet (500 mg total) by mouth every morning. 08/02/15  Yes Sital Mody, MD  lisinopril (PRINIVIL,ZESTRIL) 40 MG tablet Take 40 mg by mouth daily. 06/26/15  Yes Historical Provider, MD  metoCLOPramide (REGLAN) 10 MG tablet Take 1 tablet (10 mg total) by mouth 4 (four) times daily.  08/02/15  Yes Adrian SaranSital Mody, MD  metoprolol (LOPRESSOR) 50 MG tablet Take 1 tablet (50 mg total) by mouth 2 (two) times daily. 09/15/15  Yes Srikar Sudini, MD  Multiple Vitamins-Minerals (DECUBI-VITE PO) Take 1 tablet by mouth daily.   Yes Historical Provider, MD  mupirocin ointment (BACTROBAN) 2 % Place 1 application into the nose 2 (two) times daily. 11/21/15  Yes Katharina Caperima Vaickute, MD  nicotine (NICODERM CQ - DOSED IN MG/24 HOURS) 14 mg/24hr patch Place 14 mg onto the skin daily.   Yes Historical Provider, MD  nitroGLYCERIN (NITROSTAT) 0.4 MG SL tablet Place 0.4 mg under the tongue every 5 (five) minutes as needed for chest pain. Reported on 07/10/2015   Yes Historical Provider, MD  nystatin (MYCOSTATIN) powder Apply 1 g topically 2 (two) times daily.   Yes Historical Provider, MD  omeprazole (PRILOSEC) 20 MG capsule Take 20 mg by mouth daily before breakfast.    Yes Historical Provider, MD  ondansetron (ZOFRAN ODT) 4 MG disintegrating tablet Take 1 tablet (4 mg total) by mouth every 6 (six) hours as needed for nausea or vomiting. 07/16/15  Yes Sharyn CreamerMark Quale, MD  oxyCODONE-acetaminophen (PERCOCET/ROXICET) 5-325 MG tablet Take 1 tablet by mouth every 6 (six) hours as needed for moderate pain. 01/03/16  Yes Katha HammingSnehalatha Konidena, MD  polyethylene glycol (MIRALAX / GLYCOLAX) packet Take 17 g by mouth daily. 08/02/15  Yes Adrian SaranSital Mody, MD  ranolazine (RANEXA) 500 MG 12 hr tablet Take 1 tablet (500 mg total) by mouth 2 (two) times daily. 07/11/15  Yes Auburn BilberryShreyang Patel, MD  risperiDONE (RISPERDAL) 1 MG tablet Take 1 mg by mouth 3 (three) times daily.    Yes Historical Provider, MD  senna (SENOKOT) 8.6 MG TABS tablet Take 2 tablets by mouth 2 (two) times daily.    Yes Historical Provider, MD  torsemide (DEMADEX) 100 MG tablet Take 100 mg by mouth daily.   Yes Historical Provider, MD   Current Facility-Administered Medications  Medication Dose Route Frequency Provider Last Rate Last Dose  . acetaminophen (TYLENOL) tablet 650 mg   650 mg Oral Q4H PRN Hillary BowJared M Gardner, DO      . calcium carbonate (TUMS - dosed in mg elemental calcium) chewable tablet 200 mg of elemental calcium  1 tablet Oral TID WC Hillary BowJared M Gardner, DO   200 mg of elemental calcium at 01/19/16 1349  . clopidogrel (PLAVIX) tablet 75 mg  75 mg Oral Daily Hillary BowJared M Gardner, DO   75 mg at 01/19/16 1336  .  colesevelam [facility]) tablet 625 mg  625 mg Oral BID WC Hillary Bow, DO      . feeding supplement (PRO-STAT SUGAR FREE 64) liquid 30 mL  30 mL Oral TID WC Hillary Bow, DO   30 mL at 01/19/16 1350  . heparin injection 5,000 Units  5,000 Units Subcutaneous Q8H Hillary Bow, DO   5,000 Units at 01/19/16 1441  . hydrALAZINE (APRESOLINE) tablet 25 mg  25 mg Oral Daily Hillary Bow, DO   25 mg at 01/19/16 1347  . insulin aspart (novoLOG) injection 0-9 Units  0-9 Units Subcutaneous Q4H Hillary Bow, DO      . isosorbide mononitrate (IMDUR) 24 hr tablet 60 mg  60 mg Oral Daily Hillary Bow, DO   60 mg at 01/19/16 1337  . lactulose (CHRONULAC) 10 GM/15ML solution 10 g  10 g Oral Daily Hillary Bow, DO      . levETIRAcetam (KEPPRA) tablet 500 mg  500 mg Oral q morning - 10a Hillary Bow, DO   500 mg at 01/19/16 1347  . lisinopril (PRINIVIL,ZESTRIL) tablet 40 mg  40 mg Oral Daily Hillary Bow, DO   40 mg at 01/19/16 1340  . metoCLOPramide (REGLAN) tablet 10 mg  10 mg Oral QID Hillary Bow, DO   10 mg at 01/19/16 1346  . metoprolol tartrate (LOPRESSOR) tablet 50 mg  50 mg Oral BID Hillary Bow, DO   50 mg at 01/19/16 1355  . morphine 2 MG/ML injection 2 mg  2 mg Intravenous Q2H PRN Hillary Bow, DO   2 mg at 01/19/16 0910  . mupirocin ointment (BACTROBAN) 2 % 1 application  1 application Nasal BID Hillary Bow, DO      . nicotine (NICODERM CQ - dosed in mg/24 hours) patch 14 mg  14 mg Transdermal Daily Hillary Bow, DO   14 mg at 01/19/16 1349  . nystatin (MYCOSTATIN/NYSTOP) topical powder 1 g  1 g Topical BID Hillary Bow, DO       . ondansetron [facility]) injection 4 mg  4 mg Intravenous Q6H PRN Hillary Bow, DO      . ondansetron (ZOFRAN-ODT) disintegrating tablet 4 mg  4 mg Oral Q6H PRN Hillary Bow, DO      . oxyCODONE-acetaminophen (PERCOCET/ROXICET) 5-325 MG per tablet 1 tablet  1 tablet Oral Q6H PRN Hillary Bow, DO   1 tablet at 01/19/16 1441  . pantoprazole (PROTONIX) EC tablet 40 mg  40 mg Oral Daily Hillary Bow, DO   40 mg at 01/19/16 1348  . polyethylene glycol (MIRALAX / GLYCOLAX) packet 17 g  17 g Oral Daily Hillary Bow, DO      . ranolazine (RANEXA) 12 hr tablet 500 mg  500 mg Oral BID Hillary Bow, DO   500 mg at 01/19/16 1348  . risperiDONE (RISPERDAL) tablet 1 mg  1 mg Oral TID Hillary Bow, DO   1 mg at 01/19/16 1358  . senna (SENOKOT) tablet 17.2 mg  2 tablet Oral BID Hillary Bow, DO      . torsemide [facility]) tablet 100 mg  100 mg Oral Daily Hillary Bow, DO   100 mg at 01/19/16 1340   Labs: Basic Metabolic Panel:  Recent Labs Lab 01/19/16 0122  NA 136  K 3.1*  CL 98*  CO2 31  GLUCOSE 169*  BUN 20  CREATININE 3.15*  CALCIUM 8.7*  CBC:  Recent Labs Lab 01/19/16 0122  WBC 7.6  NEUTROABS 5.3  HGB 7.8*  HCT 25.8*  MCV 90.5  PLT 448*   Cardiac Enzymes:  Recent Labs Lab 01/19/16 0122 01/19/16 0455 01/19/16 0801 01/19/16 1124  TROPONINI 0.04* 0.04* 0.04* 0.03*   CBG:  Recent Labs Lab 01/19/16 0811 01/19/16 1154  GLUCAP 95 88   Studies/Results: Dg Chest 2 View  Result Date: 01/19/2016 CLINICAL DATA:  Chest pain and dyspnea since a o'clock. History post dialysis angina. EXAM: CHEST  2 VIEW COMPARISON:  01/02/2016 FINDINGS: Left IJ dialysis catheter tip terminates in the distal SVC. Hazy opacity with obscuration of left hemidiaphragm consistent with small layering effusion. Cardiac silhouette is normal. Atheromatous calcification of the aorta. No aneurysm. Mild vascular congestion. IMPRESSION: Mild vascular congestion with small left pleural  effusion. Superimposed infiltrate at the left lung base would be difficult to entirely exclude. Electronically Signed   By: Tollie Eth M.D.   On: 01/19/2016 04:22   Dialysis Orders:  [facility] on [address], TTS. HBsAg negative on 12/26/15. 4 hours, EDW 117kg, TDC, 2K/2.5Ca bath, BFR 400/DFR 800, Heparin 2000 bolus + 900/hr pump - Epogen 10,000 TIW  Assessment/Plan: 1.  Chest pain/unstable angina: Troponin stable. Hx recent NSTEMI. Per cardiology, no plan for repeat cath at this time and symptoms may be from pre-load reduction after HD. 2.  ESRD: Will continue HD per TTS schedule, next HD 10/21.  3.  Hypokalemia: Taken several hours after finishing HD. Would repeat. If still low, may need small supplement. 4.  Hypertension/volume: BP variable. She has significant edema and vascular congestion on CXR, needs more fluid removal. Unsure if this will exacerbate angina symptoms. Will see what we can get tomorrow with plan for gradual EDW reduction here or as outpatient. 5.  Anemia: Hgb 7.8, very low. Will review records and start ESA. Will consider blood transfusion with HD tomorrow. May help with angina and volume removal. 6.  Metabolic bone disease: Ca 8.7; continue Tums as binder for now. Check Phos in AM. 7. Type 2 DM: Per primary. 8.  Presence of PD cath: Previously on PD, s/p multiple medical issues leading to debility and living in SNF which required changing dialysis modality in 07/2015. The PD cath has not been used for 6 months, not flushed recently. Will have floor RN attempt to flush to see if still patent. If not patent, will likely need to be removed soon (does not have to be during this admission).  Ozzie Hoyle, PA-C 01/19/2016, 3:35 PM  [facility] Pager: [phone]  Pt seen, examined, agree w assess/plan as above with additions as indicated. [facility] ESRD pt with recurrent CP, hx of cor stent x 3, prior MI's.  Has sig vol overload on exam but no resp  symptoms.  Plan aggressive UF with HD as above. HD tomorrow.  Vinson Moselle MD [employer] pager [phone]    cell [phone] 01/19/2016, 4:40 PM

## 2016-01-19 NOTE — ED Notes (Signed)
Kimberly Montgomery with lab called with critical troponin of 0.04.

## 2016-01-19 NOTE — NC FL2 (Addendum)
[place] MEDICAID FL2 LEVEL OF CARE SCREENING TOOL     IDENTIFICATION  Patient Name: Kimberly Montgomery Birthdate: 11/02/1965 Sex: female Admission Date (Current Location): 01/19/2016  [facility] [place]   Facility and Address:  [facility], [address], [place], [place] [address]      Provider Number: [id number]  Attending Physician Name and Address:  Vassie Lollarlos Madera, MD  Relative Name and Phone Number:       Current Level of Care: Hospital Recommended Level of Care: Skilled Nursing Facility Prior Approval Number:    Date Approved/Denied:   PASRR Number: [id number]  Discharge Plan: SNF    Current Diagnoses: Patient Active Problem List   Diagnosis Date Noted  . Bacteremia 12/27/2015  . Essential hypertension 11/21/2015  . Hyperlipidemia 11/21/2015  . Anemia 11/21/2015  . Acute respiratory failure with hypoxia (HCC) 11/21/2015  . Acute diastolic CHF (congestive heart failure) (HCC) 11/21/2015  . ESRD on dialysis (HCC) 11/21/2015  . 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 NSTEMI (non-ST elevated myocardial infarction) (HCC) 11/20/2015  . Chest pain, rule out acute myocardial infarction 11/18/2015  . Bipolar I disorder, most recent episode depressed (HCC)   . Altered mental status 08/24/2015  . ESRD (end stage renal disease) (HCC)   . Ileus (HCC)   . Bipolar I disorder (HCC) 07/25/2015  . Seizures (HCC) 07/25/2015  . Peritonitis (HCC) 07/16/2015  . Unstable angina (HCC) 07/09/2015  . ESRD on peritoneal dialysis (HCC) 07/09/2015  . Accelerated hypertension 07/09/2015  . Type 2 diabetes mellitus (HCC) 07/09/2015  . CAD (coronary artery disease) 07/09/2015  . HLD (hyperlipidemia) 07/09/2015  . GERD (gastroesophageal reflux disease) 07/09/2015    Orientation RESPIRATION BLADDER Height & Weight     Self, Time, Situation, Place  Normal Incontinent (Peritoneal catheter) Weight: 260 lb 2.3 oz (118 kg) Height:  5\' 9"  (175.3 cm)   BEHAVIORAL SYMPTOMS/MOOD NEUROLOGICAL BOWEL NUTRITION STATUS   (None) Convulsions/Seizures Incontinent Diet (Heart healthy/carb-modified)  AMBULATORY STATUS COMMUNICATION OF NEEDS Skin     Verbally Other (Comment) (Amputation, catheter entry)                       Personal Care Assistance Level of Assistance              Functional Limitations Info  Sight, Hearing, Speech Sight Info: Adequate Hearing Info: Adequate Speech Info: Adequate    SPECIAL CARE FACTORS FREQUENCY  Blood pressure, Diabetic urine testing                    Contractures Contractures Info: Not present    Additional Factors Info  Code Status, Allergies, Psychotropic, Isolation Precautions Code Status Info: Full Allergies Info: Cephalosporins, Penicillins, Lamictal Lamotrigine, Phenergan Promethazine Hcl, Pravastatin, Sulfa Antibiotics Psychotropic Info: Bipolar I   Isolation Precautions Info: Contact: MRSA     Current Medications (01/19/2016):  This is the current hospital active medication list Current Facility-Administered Medications  Medication Dose Route Frequency Provider Last Rate Last Dose  . acetaminophen (TYLENOL) tablet 650 mg  650 mg Oral Q4H PRN Hillary BowJared M Gardner, DO      . calcium carbonate (TUMS - dosed in mg elemental calcium) chewable tablet 200 mg of elemental calcium  1 tablet Oral TID WC Hillary BowJared M Gardner, DO      . clopidogrel (PLAVIX) tablet 75 mg  75 mg Oral Daily Hillary BowJared M Gardner, DO      . colesevelam [facility](WELCHOL) tablet 625 mg  625 mg Oral BID WC Hillary Bow, DO      . feeding supplement (PRO-STAT SUGAR FREE 64) liquid 30 mL  30 mL Oral TID WC Hillary Bow, DO      . heparin injection 5,000 Units  5,000 Units Subcutaneous Q8H Hillary Bow, DO      . hydrALAZINE (APRESOLINE) tablet 25 mg  25 mg Oral Daily Jared M Gardner, DO      . insulin aspart (novoLOG) injection 0-9 Units  0-9 Units Subcutaneous Q4H Jared M Gardner, DO      . isosorbide mononitrate (IMDUR) 24  hr tablet 60 mg  60 mg Oral Daily Jared M Gardner, DO      . lactulose (CHRONULAC) 10 GM/15ML solution 10 g  10 g Oral Daily Hillary Bow, DO      . levETIRAcetam (KEPPRA) tablet 500 mg  500 mg Oral q morning - 10a Jared M Gardner, DO      . lisinopril (PRINIVIL,ZESTRIL) tablet 40 mg  40 mg Oral Daily Hillary Bow, DO      . metoCLOPramide (REGLAN) tablet 10 mg  10 mg Oral QID Hillary Bow, DO      . metoprolol tartrate (LOPRESSOR) tablet 50 mg  50 mg Oral BID Hillary Bow, DO      . morphine 2 MG/ML injection 2 mg  2 mg Intravenous Q2H PRN Hillary Bow, DO   2 mg at 01/19/16 0910  . mupirocin ointment (BACTROBAN) 2 % 1 application  1 application Nasal BID Hillary Bow, DO      . nicotine (NICODERM CQ - dosed in mg/24 hours) patch 14 mg  14 mg Transdermal Daily Hillary Bow, DO      . nystatin (MYCOSTATIN/NYSTOP) topical powder 1 g  1 g Topical BID Hillary Bow, DO      . ondansetron [facility]) injection 4 mg  4 mg Intravenous Q6H PRN Hillary Bow, DO      . ondansetron (ZOFRAN-ODT) disintegrating tablet 4 mg  4 mg Oral Q6H PRN Hillary Bow, DO      . oxyCODONE-acetaminophen (PERCOCET/ROXICET) 5-325 MG per tablet 1 tablet  1 tablet Oral Q6H PRN Hillary Bow, DO   1 tablet at 01/19/16 0526  . pantoprazole (PROTONIX) EC tablet 40 mg  40 mg Oral Daily Hillary Bow, DO      . polyethylene glycol (MIRALAX / GLYCOLAX) packet 17 g  17 g Oral Daily Hillary Bow, DO      . ranolazine (RANEXA) 12 hr tablet 500 mg  500 mg Oral BID Hillary Bow, DO      . risperiDONE (RISPERDAL) tablet 1 mg  1 mg Oral TID Hillary Bow, DO      . senna (SENOKOT) tablet 17.2 mg  2 tablet Oral BID Hillary Bow, DO      . torsemide [facility]) tablet 100 mg  100 mg Oral Daily Hillary Bow, DO         Discharge Medications: Please see discharge summary for a list of discharge medications.  Relevant Imaging Results:  Relevant Lab Results:   Additional Information SS#:  [id number]  Margarito Liner, LCSW  Vassie Loll MD [phone]

## 2016-01-19 NOTE — ED Triage Notes (Signed)
Per EMS, pt from [facility] with c/o chest pain starting at 2100 last night. Pt received dialysis yesterday without difficulty. Pt has hx post-dialysis angina. Pt received 3 NTG at facility without relief. Pt received an additional 2 NTG with EMS PTA without relief. Pt given 4mg  morphine with EMS and pain relieved. BP-183/92

## 2016-01-19 NOTE — ED Provider Notes (Addendum)
By signing my name below, I, Kimberly Montgomery, attest that this documentation has been prepared under the direction and in the presence of Kimberly N Ward, DO. Electronically Signed: Doreatha MartinEva Montgomery, ED Scribe. 01/19/16. 2:15 AM.   TIME SEEN: 12:50 AM   CHIEF COMPLAINT:  Chief Complaint  Patient presents with  . Chest Pain      HPI:  HPI Comments: Kimberly Montgomery is a 50 y.o. 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 She reports associated SOB, occasional nausea. Pt describes her pain as crushing and squeezing. No worsening or alleviating factors noted. Pt reports frequent h/o similar pain. Per pt, similar pain frequently occurs after receiving dialysis. Pt notes NTG and ASA typically alleviates her pain.  Received both with EMS. Pt was recently admitted to [facility] on 12/25/15 for tx of NSTEMI, fever, enterobacter bacteremia and CP. She is not currently taking antibiotics. Pt has h/o PCI with stent June 2017 to the mid LAD. Had re-cardiac catheterization 11/20/2015 that did not show any in-stent restenosis but she still had moderate disease in several vessels. Pt was last dialyzed yesterday and had a full dialysis session. She has been dialyzed for 3 years and still makes urine. Cardiologist is Dr. Welton FlakesKhan. She denies diaphoresis, dizziness, fever, cough, vomiting, diarrhea.    Cath 09/14/15:   Mid RCA lesion, 30% stenosed. The lesion was previously treated with a stent (unknown type).  1st RPLB lesion, 40% stenosed.  1st Mrg lesion, 50% stenosed. The lesion was not previously treated.  Mid LAD lesion, 90% stenosed. The lesion was not previously treated.  Dist LAD lesion, 65% stenosed. The lesion was not previously treated.   High grade lesion mid LAD treated with DES.   Cath 11/20/15:    1st Mrg lesion, 50 %stenosed.  Prox LAD  to Mid LAD lesion, 30 %stenosed.  Dist LAD lesion, 50 %stenosed.  Prox RCA to Dist RCA lesion, 35 %stenosed.   Stent in the mid LAD was patent and there is mild to moderate disease in the distal LAD is as well as the in stent in the right coronary is patent. LV gram was deferred due to renal insufficiency. Advice medical therapy by adding Isosorbide.   ROS: See HPI Constitutional: no fever, diaphoresis  Eyes: no drainage  ENT: no runny nose   Cardiovascular:  + chest pain  Resp: no cough. + SOB  GI: no vomiting, diarrhea. + nausea  GU: no dysuria Integumentary: no rash  Allergy: no hives  Musculoskeletal: no leg swelling  Neurological: no slurred speech, dizziness ROS otherwise negative  PAST MEDICAL HISTORY/PAST SURGICAL HISTORY:  Past Medical History:  Diagnosis Date  . Asthma   . CAD (coronary artery disease)   . CHF (congestive heart failure) (HCC)   . Diabetes mellitus without complication (HCC)   . Endometriosis   . ESRD (end stage renal disease) (HCC)   . Gastroparesis   . GERD (gastroesophageal reflux disease)   . HLD (hyperlipidemia)   . Hypertension   . Renal disorder     MEDICATIONS:  Prior to Admission medications   Medication Sig Start Date End Date Taking? Authorizing Provider  Amino Acids-Protein Hydrolys (FEEDING SUPPLEMENT, PRO-STAT SUGAR FREE 64,) LIQD Take 30 mLs by mouth 3 (three) times daily with meals.    Historical Provider, MD  bisacodyl (DULCOLAX) 10 MG suppository  Place 10 mg rectally daily as needed for moderate constipation. Use if no relief from Milk of Magnesia    Historical Provider, MD  Bismuth Tribromoph-Petrolatum (XEROFORM PETROLATUM DRESSING) PADS Apply 1 each topically every 3 (three) days.    Historical Provider, MD  calcitRIOL (ROCALTROL) 0.25 MCG capsule Take 0.25 mcg by mouth daily.    Historical Provider, MD  calcium acetate (PHOSLO) 667 MG capsule Take 3 capsules (2,001 mg total) by mouth 3 (three) times daily with meals.  07/22/15   Kimberly Finner, MD  calcium carbonate (TUMS - DOSED IN MG ELEMENTAL CALCIUM) 500 MG chewable tablet Chew 1 tablet (200 mg of elemental calcium total) by mouth 3 (three) times daily with meals. 08/02/15   Adrian Saran, MD  clopidogrel (PLAVIX) 75 MG tablet Take 75 mg by mouth daily.    Historical Provider, MD  colesevelam (WELCHOL) 625 MG tablet Take 1 tablet (625 mg total) by mouth 2 (two) times daily with a meal. 11/21/15   Katharina Caper, MD  hydrALAZINE (APRESOLINE) 25 MG tablet Take 25 mg by mouth daily.     Historical Provider, MD  insulin aspart (NOVOLOG FLEXPEN) 100 UNIT/ML FlexPen Inject 0-10 Units into the skin 3 (three) times daily with meals. Inject per sliding scale: If 0-150 = 0    151-200 = 2 units   201-250 = 4 units   251-300 = 6 units    301-350 = 8 units    351-400 = 10 units  Anything above 400, call MD    Historical Provider, MD  insulin glargine (LANTUS) 100 UNIT/ML injection Inject 0.08 mLs (8 Units total) into the skin at bedtime. 08/30/15   Milagros Loll, MD  isosorbide mononitrate (IMDUR) 60 MG 24 hr tablet Take 1 tablet (60 mg total) by mouth daily. 07/11/15   Kimberly Bilberry, MD  lactulose (CHRONULAC) 10 GM/15ML solution Take 10 g by mouth daily.     Historical Provider, MD  levETIRAcetam (KEPPRA) 500 MG tablet Take 1 tablet (500 mg total) by mouth every morning. 08/02/15   Adrian Saran, MD  lisinopril (PRINIVIL,ZESTRIL) 40 MG tablet Take 40 mg by mouth daily. 06/26/15   Historical Provider, MD  metoCLOPramide (REGLAN) 10 MG tablet Take 1 tablet (10 mg total) by mouth 4 (four) times daily. 08/02/15   Adrian Saran, MD  metoprolol (LOPRESSOR) 50 MG tablet Take 1 tablet (50 mg total) by mouth 2 (two) times daily. 09/15/15   Srikar Sudini, MD  Multiple Vitamins-Minerals (DECUBI-VITE PO) Take 1 tablet by mouth daily.    Historical Provider, MD  mupirocin ointment (BACTROBAN) 2 % Place 1 application into the nose 2 (two) times daily. 11/21/15   Katharina Caper, MD  nicotine (NICODERM CQ -  DOSED IN MG/24 HOURS) 14 mg/24hr patch Place 14 mg onto the skin daily.    Historical Provider, MD  nitroGLYCERIN (NITROSTAT) 0.4 MG SL tablet Place 0.4 mg under the tongue every 5 (five) minutes as needed for chest pain. Reported on 07/10/2015    Historical Provider, MD  nystatin (MYCOSTATIN) powder Apply 1 g topically 2 (two) times daily.    Historical Provider, MD  omeprazole (PRILOSEC) 20 MG capsule Take 20 mg by mouth daily before breakfast.     Historical Provider, MD  ondansetron (ZOFRAN ODT) 4 MG disintegrating tablet Take 1 tablet (4 mg total) by mouth every 6 (six) hours as needed for nausea or vomiting. 07/16/15   Sharyn Creamer, MD  oxyCODONE-acetaminophen (PERCOCET/ROXICET) 5-325 MG tablet Take 1 tablet by mouth every 6 (  six) hours as needed for moderate pain. 01/03/16   Katha Hamming, MD  polyethylene glycol (MIRALAX / GLYCOLAX) packet Take 17 g by mouth daily. 08/02/15   Adrian Saran, MD  ranolazine (RANEXA) 500 MG 12 hr tablet Take 1 tablet (500 mg total) by mouth 2 (two) times daily. 07/11/15   Kimberly Bilberry, MD  risperiDONE (RISPERDAL) 1 MG tablet Take 1 mg by mouth 3 (three) times daily.     Historical Provider, MD  senna (SENOKOT) 8.6 MG TABS tablet Take 2 tablets by mouth 2 (two) times daily.     Historical Provider, MD  torsemide (DEMADEX) 100 MG tablet Take 100 mg by mouth daily.    Historical Provider, MD    ALLERGIES:  Allergies  Allergen Reactions  . Cephalosporins Anaphylaxis    Patient has tolerated meropenem.   [name] Penicillins Anaphylaxis and Other (See Comments)    Has patient had a PCN reaction causing immediate rash, facial/tongue/throat swelling, SOB or lightheadedness with hypotension: Yes Has patient had a PCN reaction causing severe rash involving mucus membranes or skin necrosis: No Has patient had a PCN reaction that required hospitalization No Has patient had a PCN reaction occurring within the last 10 years: No If all of the above answers are "NO", then may  proceed with Cephalosporin use.  . Lamictal [Lamotrigine] Other (See Comments)    Reaction:  Hallucinations  . Phenergan [Promethazine Hcl] Nausea And Vomiting  . Pravastatin Other (See Comments)    Reaction:  Muscle pain   . Sulfa Antibiotics Other (See Comments)    Reaction:  Unknown     SOCIAL HISTORY:  Social History  Substance Use Topics  . Smoking status: Former Smoker    Packs/day: 0.50    Types: Cigarettes  . Smokeless tobacco: Never Used  . Alcohol use No    FAMILY HISTORY: Family History  Problem Relation Age of Onset  . CAD    . Diabetes    . Bipolar disorder    . Cervical cancer Mother     EXAM: BP 181/67   Pulse 77   Temp 98.5 F (36.9 C) (Oral)   Resp 11   LMP 03/16/2015   SpO2 100%  CONSTITUTIONAL: Alert and oriented and responds appropriately to questions. Obese, chronically ill-appearing, afebrile, nontoxic HEAD: Normocephalic EYES: Conjunctivae clear, PERRL ENT: normal nose; no rhinorrhea; moist mucous membranes NECK: Supple, no meningismus, no LAD  CARD: RRR; S1 and S2 appreciated; no murmurs, no clicks, no rubs, no gallops RESP: Normal chest excursion without splinting or tachypnea; breath sounds clear and equal bilaterally; no wheezes, no rhonchi, no rales, no hypoxia or respiratory distress, speaking full sentences ABD/GI: Normal bowel sounds; non-distended; soft, non-tender, no rebound, no guarding, no peritoneal signs BACK:  The back appears normal and is non-tender to palpation, there is no CVA tenderness EXT: Normal ROM in all joints; non-tender to palpation; no edema; normal capillary refill; no cyanosis, no calf tenderness or swelling    SKIN: Normal color for age and race; warm; no rash NEURO: Moves all extremities equally, sensation to light touch intact diffusely, cranial nerves II through XII intact PSYCH: The patient's mood and manner are appropriate. Grooming and personal hygiene are appropriate.  MEDICAL DECISION MAKING: Patient  here with complaints of crushing left chest pain that radiates into her arm or shortness of breath, nausea. Reports feels similar to her prior anginal equivalent. Recently had stent placed in the mid LAD in June 2017 at [facility]. Had another cardiac catheterization  in August 2017 which did not show any restenosis of the stent but she still had moderate coronary artery disease. No history of PE or DVT. She is not tachycardic, tachypneic or hypoxic. Doubt dissection. Doubt PE. No fevers, cough, chills. States she is no longer on antibody for her bacteremia. We'll give morphine and she states this does help her chest pain. We'll obtain cardiac labs, chest x-ray.  ED PROGRESS: Labs show chronic kidney disease. Troponin negative. Chest x-ray shows possible infiltrate but patient denies fevers, chills, cough. I do not think this is clinically pneumonia. Pain is almost completely resolved after morphine. We'll give second dose. We'll discuss with medicine for admission given she does have coronary artery disease and recent stent for an overnight chest pain rule out.   4:45 AM  Discussed patient's case with hospitalist, Dr. Julian Reil.  Recommend admission to telemetry, observation bed.  I will place holding orders per their request. Patient and family (if present) updated with plan. Care transferred to hospitalist service.  I reviewed all nursing notes, vitals, pertinent old records, EKGs, labs, imaging (as available).     EKG Interpretation  Date/Time:  Friday January 19 2016 00:21:16 EDT Ventricular Rate:  79 PR Interval:    QRS Duration: 109 QT Interval:  400 QTC Calculation: 459 R Axis:   77 Text Interpretation:  Sinus rhythm Artifact No longer in atrial fibrillation Confirmed by WARD,  DO, Kimberly ([id number]) on 01/19/2016 12:26:55 AM       I personally performed the services described in this documentation, which was scribed in my presence. The recorded information has been reviewed and is  accurate.    Layla Maw Ward, DO 01/19/16 1224    Layla Maw Ward, DO 01/19/16 [id number]

## 2016-01-19 NOTE — Consult Note (Signed)
Cardiology Consult    Patient ID: Kimberly Montgomery MRN: [id number], DOB/AGE: 52-Aug-1967   Admit date: 01/19/2016 Date of Consult: 01/19/2016  Primary Physician: Jarome Matin, MD Reason for Consult: chest pain  Primary Cardiologist: Dr. Welton Flakes in [place]  Requesting Provider: Dr. Gwenlyn Perking  Patient Profile    Kimberly Montgomery is a 50 year old female with a past medical history of CAD s/p DES to mid LAD in setting of NSTEMI in June 2017, also with history of stenting to the RCA, HLD, ESRD on HD, DM, and CHF. She presented to the ED on 01/19/16 with chest pain.   History of Present Illness  Kimberly Montgomery had HD yesterday and finished her treatment around 11am, she was fine up until 8 pm when she developed acute onset chest pain that she describes as a squeezing sensation on the left side of her chest with radiation to her left arm. She took one SL Nitro and had relief of her pain and fell asleep, but was awakened with chest pain again around 9:30 pm. She took another [employer] and EMS was called. She resides at a skilled nursing facility.   EKG on arrival to the ED showed NSR with no acute ST/T wave changes. Troponin was 0.04.   In June of 2017, Kimberly Montgomery had NSTEMI and was found to have a 90% stenosis in her mid LAD that was treated with a DES. She was already on Plavix prior to admission, so this was continued. In that setting she had chest pain and SOB following an HD treatment.   Last heart cath was in August of this year, when she presented with chest pain and a minimal troponin elevation of 0.27. Cath showed widely patent LAD stent with moderate disease in the distal LAD, as well as the moderate disease in the first OM. This was treated medically.   She says that she frequently gets chest pain following HD. There are usually no associated symptoms of SOB, nausea or diaphoresis. She takes Plavix, ASA, isosorbide, metoprolol, and Ranexa.   Past Medical History   Past Medical History:    Diagnosis Date  . Asthma   . CAD (coronary artery disease)   . CHF (congestive heart failure) (HCC)   . Diabetes mellitus without complication (HCC)   . Endometriosis   . ESRD (end stage renal disease) (HCC)   . Gastroparesis   . GERD (gastroesophageal reflux disease)   . HLD (hyperlipidemia)   . Hypertension   . Renal disorder     Past Surgical History:  Procedure Laterality Date  . BELOW KNEE LEG AMPUTATION    . CARDIAC CATHETERIZATION Right 07/10/2015   Procedure: Left Heart Cath and Coronary Angiography;  Surgeon: Laurier Nancy, MD;  Location: [facility];  Service: Cardiovascular;  Laterality: Right;  . CARDIAC CATHETERIZATION Right 09/14/2015   Procedure: Left Heart Cath and Coronary Angiography;  Surgeon: Laurier Nancy, MD;  Location: [facility];  Service: Cardiovascular;  Laterality: Right;  . CARDIAC CATHETERIZATION N/A 09/14/2015   Procedure: Coronary Stent Intervention;  Surgeon: Alwyn Pea, MD;  Location: [facility];  Service: Cardiovascular;  Laterality: N/A;  . CARDIAC CATHETERIZATION Right 11/20/2015   Procedure: Left Heart Cath and Coronary Angiography;  Surgeon: Laurier Nancy, MD;  Location: [facility];  Service: Cardiovascular;  Laterality: Right;  . hd fistula surgery with reversal    . HYSTEROTOMY    . pd tube inserstion    .  PERIPHERAL VASCULAR CATHETERIZATION N/A 08/30/2015   Procedure: Dialysis/Perma Catheter Insertion;  Surgeon: Annice Needy, MD;  Location: [facility];  Service: Cardiovascular;  Laterality: N/A;  . PERIPHERAL VASCULAR CATHETERIZATION N/A 01/01/2016   Procedure: Dialysis/Perma Catheter Insertion;  Surgeon: Annice Needy, MD;  Location: [facility];  Service: Cardiovascular;  Laterality: N/A;     Allergies  Allergies  Allergen Reactions  . Cephalosporins Anaphylaxis    Patient has tolerated meropenem.   [name] Penicillins Anaphylaxis and Other (See Comments)    Has patient had a PCN  reaction causing immediate rash, facial/tongue/throat swelling, SOB or lightheadedness with hypotension: Yes Has patient had a PCN reaction causing severe rash involving mucus membranes or skin necrosis: No Has patient had a PCN reaction that required hospitalization No Has patient had a PCN reaction occurring within the last 10 years: No If all of the above answers are "NO", then may proceed with Cephalosporin use.  . Lamictal [Lamotrigine] Other (See Comments)    Reaction:  Hallucinations  . Phenergan [Promethazine Hcl] Nausea And Vomiting  . Pravastatin Other (See Comments)    Reaction:  Muscle pain   . Sulfa Antibiotics Other (See Comments)    Reaction:  Unknown     Inpatient Medications    . calcium carbonate  1 tablet Oral TID WC  . clopidogrel  75 mg Oral Daily  . colesevelam  625 mg Oral BID WC  . feeding supplement (PRO-STAT SUGAR FREE 64)  30 mL Oral TID WC  . heparin  5,000 Units Subcutaneous Q8H  . hydrALAZINE  25 mg Oral Daily  . insulin aspart  0-9 Units Subcutaneous Q4H  . isosorbide mononitrate  60 mg Oral Daily  . lactulose  10 g Oral Daily  . levETIRAcetam  500 mg Oral q morning - 10a  . lisinopril  40 mg Oral Daily  . metoCLOPramide  10 mg Oral QID  . metoprolol  50 mg Oral BID  . mupirocin ointment  1 application Nasal BID  . nicotine  14 mg Transdermal Daily  . nystatin  1 g Topical BID  . pantoprazole  40 mg Oral Daily  . polyethylene glycol  17 g Oral Daily  . ranolazine  500 mg Oral BID  . risperiDONE  1 mg Oral TID  . senna  2 tablet Oral BID  . torsemide  100 mg Oral Daily    Family History    Family History  Problem Relation Age of Onset  . CAD    . Diabetes    . Bipolar disorder    . Cervical cancer Mother     Social History    Social History   Social History  . Marital status: Widowed    Spouse name: N/A  . Number of children: N/A  . Years of education: N/A   Occupational History  . disabled    Social History Main Topics  .  Smoking status: Former Smoker    Packs/day: 0.50    Types: Cigarettes  . Smokeless tobacco: Never Used  . Alcohol use No  . Drug use: No  . Sexual activity: No   Other Topics Concern  . Not on file   Social History Narrative  . No narrative on file     Review of Systems    General:  No chills, fever, night sweats or weight changes.  Cardiovascular:  + chest pain, dyspnea on exertion, edema, orthopnea, palpitations, paroxysmal nocturnal dyspnea. Dermatological: No rash, lesions/masses Respiratory:  No cough, dyspnea Urologic: No hematuria, dysuria Abdominal:   No nausea, vomiting, diarrhea, bright red blood per rectum, melena, or hematemesis Neurologic:  No visual changes, wkns, changes in mental status. All other systems reviewed and are otherwise negative except as noted above.  Physical Exam    Blood pressure (!) 188/75, pulse 88, temperature 98 F (36.7 C), temperature source Oral, resp. rate 17, height 5\' 9"  (1.753 m), weight 260 lb 2.3 oz (118 kg), last menstrual period 03/16/2015, SpO2 98 %.  General: Pleasant, NAD Psych: Normal affect. Neuro: Alert and oriented X 3. Moves all extremities spontaneously. HEENT: Normal  Neck: Supple without bruits or JVD. Lungs:  Resp regular and unlabored, CTA. Heart: RRR no s3, s4, or murmurs. Abdomen: Soft, non-tender, non-distended, BS + x 4.  Extremities: No clubbing, cyanosis or edema. DP/PT/Radials 2+ and equal bilaterally. Has right BKA.  Labs    Troponin [facility] Test)  Recent Labs  01/19/16 0141  TROPIPOC 0.01    Recent Labs  01/19/16 0122 01/19/16 0455 01/19/16 0801 01/19/16 1124  TROPONINI 0.04* 0.04* 0.04* 0.03*   Lab Results  Component Value Date   WBC 7.6 01/19/2016   HGB 7.8 (L) 01/19/2016   HCT 25.8 (L) 01/19/2016   MCV 90.5 01/19/2016   PLT 448 (H) 01/19/2016    Recent Labs Lab 01/19/16 0122  NA 136  K 3.1*  CL 98*  CO2 31  BUN 20  CREATININE 3.15*  CALCIUM 8.7*  GLUCOSE 169*    Lab Results  Component Value Date   CHOL 215 (H) 11/19/2015   HDL 63 11/19/2015   LDLCALC 115 (H) 11/19/2015   TRIG 186 (H) 11/19/2015     Radiology Studies    Dg Chest 1 View  Result Date: 01/02/2016 CLINICAL DATA:  Status post dialysis catheter placement today. EXAM: CHEST 1 VIEW COMPARISON:  Single-view of the chest 12/25/2015. FINDINGS: New left IJ approach dialysis catheter is in place with the tip projecting at the superior cavoatrial junction. No pneumothorax. Right IJ dialysis catheter present on the prior exam has been removed. Lungs are clear. Heart size is normal. No pleural fluid. IMPRESSION: Tip of new dialysis catheter projects at the superior cavoatrial junction. Negative for pneumothorax. Electronically Signed   By: Drusilla Kanner M.D.   On: 01/02/2016 10:27   Dg Chest 2 View  Result Date: 01/19/2016 CLINICAL DATA:  Chest pain and dyspnea since a o'clock. History post dialysis angina. EXAM: CHEST  2 VIEW COMPARISON:  01/02/2016 FINDINGS: Left IJ dialysis catheter tip terminates in the distal SVC. Hazy opacity with obscuration of left hemidiaphragm consistent with small layering effusion. Cardiac silhouette is normal. Atheromatous calcification of the aorta. No aneurysm. Mild vascular congestion. IMPRESSION: Mild vascular congestion with small left pleural effusion. Superimposed infiltrate at the left lung base would be difficult to entirely exclude. Electronically Signed   By: Tollie Eth M.D.   On: 01/19/2016 04:22   Dg Chest 2 View  Result Date: 12/24/2015 CLINICAL DATA:  Chest pain this morning. Previous coronary artery stent placement. EXAM: CHEST  2 VIEW COMPARISON:  12/07/2015. FINDINGS: Stable enlarged cardiac silhouette. The right jugular catheter tip remains in the superior vena cava. The interstitial markings are minimally prominent. Otherwise, clear lungs. Small to moderate-sized pleural effusion posteriorly, most likely on the left. Aortic arch calcification.  Mild thoracic spine degenerative changes. Minimal bilateral shoulder degenerative changes. IMPRESSION: 1. Small to moderate size left pleural effusion posteriorly at the lung bases. 2. Mild aortic atherosclerosis.  3. Minimal chronic interstitial lung disease. Electronically Signed   By: Beckie SaltsSteven  Reid M.D.   On: 12/24/2015 08:45   Dg Abd 1 View  Result Date: 01/02/2016 CLINICAL DATA:  50 year old female with abdominal pain and constipation. EXAM: ABDOMEN - 1 VIEW COMPARISON:  Radiograph dated 07/31/2015 FINDINGS: Large amount of stool noted throughout the visualized colon. Dense stool noted in the rectosigmoid. No bowel dilatation or evidence of obstruction. There is no free air or radiopaque calculi. The soft tissues and osseous structures appear unremarkable. IMPRESSION: Constipation.  No bowel obstruction. Electronically Signed   By: Elgie CollardArash  Radparvar M.D.   On: 01/02/2016 20:03   Ct Head Wo Contrast  Result Date: 12/30/2015 CLINICAL DATA:  Altered mental status EXAM: CT HEAD WITHOUT CONTRAST TECHNIQUE: Contiguous axial images were obtained from the base of the skull through the vertex without intravenous contrast. COMPARISON:  07/21/2015 FINDINGS: Brain: Mild global atrophy. Chronic ischemic changes in the periventricular white matter. No mass effect, midline shift, or acute hemorrhage. Vascular: Carotid siphon calcifications Skull: Intact Sinuses/Orbits: Mucosal thickening in the left maxillary sinus. Other: Noncontributory IMPRESSION: No acute intracranial pathology.  Chronic changes. Electronically Signed   By: Jolaine ClickArthur  Hoss M.D.   On: 12/30/2015 14:31   Dg Chest Portable 1 View  Result Date: 12/25/2015 CLINICAL DATA:  Chest pain since last night. EXAM: PORTABLE CHEST 1 VIEW COMPARISON:  December 24, 2015 FINDINGS: The heart size and mediastinal contours are stable. Right central venous sinus unchanged distal tip in superior vena cava. There is no focal infiltrate, pulmonary edema, or pleural  effusion. The visualized skeletal structures are stable. IMPRESSION: No active cardiopulmonary disease. Electronically Signed   By: Sherian ReinWei-Chen  Lin M.D.   On: 12/25/2015 15:54    EKG & Cardiac Imaging    EKG: NSR  Echocardiogram: June 2017 Left ventricle: The cavity size was normal. Systolic function was   vigorous. The estimated ejection fraction was 65%. Wall motion   was normal; there were no regional wall motion abnormalities.   Doppler parameters are consistent with abnormal left ventricular   relaxation (grade 1 diastolic dysfunction). - Aortic valve: Valve area (Vmax): 2.17 cm^2. - Mitral valve: Calcified annulus. There was mild regurgitation. - Left atrium: The atrium was mildly dilated. - Right atrium: The atrium was mildly dilated. - Atrial septum: No defect or patent foramen ovale was identified.  Impressions:  - Normal LVEF and wall motion and mild diastolic dysfunction.    Assessment & Plan    1. CAD with stable angina: Patient describes chest pain that occurs after her HD treatments that are relieved with Nitro. Likely that her pain is demand ischemia from decreased preload after HD.   Last heart cath August 2017 showed no obstructive CAD and patent LAD stent. No need to repeat cath this admission. She is chest pain free currently. Would increase her isosorbide to 60mg  BID. She is on a good anti-anginal regimen currently with beta blocker, isosorbide, and Ranexa.   2. ESRD on HD  3. HTN: hypertensive at times, can consider increasing metoprolol to 75mg  BID.   4. HLD: Intolerant to statins, continue Welchol.      Signed, Little IshikawaErin E Smith, NP 01/19/2016, 2:30 PM Pager: [phone]  I have personally seen and examined this patient with Suzzette RighterErin Smith, NP. I agree with the assessment and plan as outlined above. She is admitted with chest pain. Known CAD with stent placement. [facility] but had chest pain following that and had relook  cath in August  2017 and all stents were patent. My exam shows an obese female in NAD, CV:RRR, Lungs: clear. Abd: soft, NT. Ext: right leg amputation, no LLE edema.  No objective evidence of ischemia. Troponin 0.04 and flat, c/w ESRD. I have personally reviewed the labs and EKG. EKG with sinus rhythm. No ischemic changes.  I agree that no ischemic workup is necessary. She has chronic stable angina. Will increase Imdur. She can f/u with DR. Park Breed at [facility] 1-2 weeks post discharge.   Verne Carrow 01/19/2016 5:42 PM

## 2016-01-20 DIAGNOSIS — I10 Essential (primary) hypertension: Secondary | ICD-10-CM | POA: Diagnosis not present

## 2016-01-20 DIAGNOSIS — L89151 Pressure ulcer of sacral region, stage 1: Secondary | ICD-10-CM | POA: Diagnosis not present

## 2016-01-20 DIAGNOSIS — D638 Anemia in other chronic diseases classified elsewhere: Secondary | ICD-10-CM

## 2016-01-20 DIAGNOSIS — R079 Chest pain, unspecified: Secondary | ICD-10-CM | POA: Diagnosis not present

## 2016-01-20 DIAGNOSIS — N186 End stage renal disease: Secondary | ICD-10-CM | POA: Diagnosis not present

## 2016-01-20 DIAGNOSIS — Z794 Long term (current) use of insulin: Secondary | ICD-10-CM

## 2016-01-20 DIAGNOSIS — E1122 Type 2 diabetes mellitus with diabetic chronic kidney disease: Secondary | ICD-10-CM

## 2016-01-20 DIAGNOSIS — Z992 Dependence on renal dialysis: Secondary | ICD-10-CM

## 2016-01-20 DIAGNOSIS — L98429 Non-pressure chronic ulcer of back with unspecified severity: Secondary | ICD-10-CM | POA: Insufficient documentation

## 2016-01-20 LAB — RENAL FUNCTION PANEL
ANION GAP: 7 (ref 5–15)
Albumin: 1.9 g/dL — ABNORMAL LOW (ref 3.5–5.0)
BUN: 28 mg/dL — ABNORMAL HIGH (ref 6–20)
CALCIUM: 9.2 mg/dL (ref 8.9–10.3)
CHLORIDE: 99 mmol/L — AB (ref 101–111)
CO2: 31 mmol/L (ref 22–32)
Creatinine, Ser: 3.91 mg/dL — ABNORMAL HIGH (ref 0.44–1.00)
GFR calc non Af Amer: 12 mL/min — ABNORMAL LOW (ref >60)
GFR, EST AFRICAN AMERICAN: 14 mL/min — AB (ref >60)
Glucose, Bld: 107 mg/dL — ABNORMAL HIGH (ref 65–99)
POTASSIUM: 3.6 mmol/L (ref 3.5–5.1)
Phosphorus: 4.7 mg/dL — ABNORMAL HIGH (ref 2.5–4.6)
Sodium: 137 mmol/L (ref 135–145)

## 2016-01-20 LAB — CBC
HCT: 24.1 % — ABNORMAL LOW (ref 36.0–46.0)
HEMOGLOBIN: 7.3 g/dL — AB (ref 12.0–15.0)
MCH: 27.5 pg (ref 26.0–34.0)
MCHC: 30.3 g/dL (ref 30.0–36.0)
MCV: 90.9 fL (ref 78.0–100.0)
Platelets: 456 10*3/uL — ABNORMAL HIGH (ref 150–400)
RBC: 2.65 MIL/uL — AB (ref 3.87–5.11)
RDW: 14.8 % (ref 11.5–15.5)
WBC: 8.2 10*3/uL (ref 4.0–10.5)

## 2016-01-20 LAB — GLUCOSE, CAPILLARY
Glucose-Capillary: 106 mg/dL — ABNORMAL HIGH (ref 65–99)
Glucose-Capillary: 146 mg/dL — ABNORMAL HIGH (ref 65–99)
Glucose-Capillary: 160 mg/dL — ABNORMAL HIGH (ref 65–99)

## 2016-01-20 MED ORDER — HEPARIN SODIUM (PORCINE) 1000 UNIT/ML DIALYSIS: 20.0000 [IU]/kg | INTRAMUSCULAR | Status: DC | PRN

## 2016-01-20 MED ORDER — OMEPRAZOLE 20 MG PO CPDR: 20.0000 mg | DELAYED_RELEASE_CAPSULE | Freq: Two times a day (BID) | ORAL | Status: DC

## 2016-01-20 MED ORDER — SODIUM CHLORIDE 0.9 % IV SOLN: 125.0000 mg | INTRAVENOUS | Status: DC

## 2016-01-20 MED ORDER — OXYCODONE-ACETAMINOPHEN 5-325 MG PO TABS: 1.0000 | ORAL_TABLET | Freq: Four times a day (QID) | ORAL | 0 refills | Status: DC | PRN

## 2016-01-20 MED ORDER — DARBEPOETIN ALFA 150 MCG/0.3ML IJ SOSY: 150.0000 ug | PREFILLED_SYRINGE | INTRAMUSCULAR | Status: DC

## 2016-01-20 MED ORDER — DARBEPOETIN ALFA 150 MCG/0.3ML IJ SOSY
150.0000 ug | PREFILLED_SYRINGE | INTRAMUSCULAR | Status: DC
  Administered 2016-01-20: 150 ug via INTRAVENOUS

## 2016-01-20 MED ORDER — LISINOPRIL 10 MG PO TABS: 10.0000 mg | ORAL_TABLET | Freq: Every day | ORAL | Status: DC

## 2016-01-20 MED ORDER — DARBEPOETIN ALFA 150 MCG/0.3ML IJ SOSY
PREFILLED_SYRINGE | INTRAMUSCULAR | Status: AC
  Administered 2016-01-20: 150 ug via INTRAVENOUS
  Filled 2016-01-20: qty 0.3

## 2016-01-20 MED ORDER — MORPHINE SULFATE (PF) 2 MG/ML IV SOLN
INTRAVENOUS | Status: AC
  Filled 2016-01-20: qty 1

## 2016-01-20 MED ORDER — LISINOPRIL 10 MG PO TABS
10.0000 mg | ORAL_TABLET | Freq: Every day | ORAL | Status: DC
  Filled 2016-01-20: qty 1

## 2016-01-20 MED ORDER — SODIUM CHLORIDE 0.9 % IV SOLN
125.0000 mg | INTRAVENOUS | Status: DC
  Administered 2016-01-20: 125 mg via INTRAVENOUS
  Filled 2016-01-20: qty 10

## 2016-01-20 MED ORDER — ISOSORBIDE MONONITRATE ER 60 MG PO TB24: 90.0000 mg | ORAL_TABLET | Freq: Every day | ORAL | Status: DC

## 2016-01-20 NOTE — Progress Notes (Signed)
Called [employer] and reported to [employer] therapist).  Discussed discharge information and medication changes.  All questions answered. Patient is leaving via ptar; o2 at 2l.

## 2016-01-20 NOTE — Discharge Summary (Signed)
Physician Discharge Summary  Kimberly Montgomery:[id number] DOB: July 13, 1965 DOA: 01/19/2016  PCP: Jarome Matin, MD  Admit date: 01/19/2016 Discharge date: 01/20/2016  Time spent: 35 minutes  Recommendations for Outpatient Follow-up:  1. Repeat CBC in 1 week to follow Hgb trend    Discharge Diagnoses:    Chest pain, rule out acute myocardial infarction   Obesity   CAD (coronary artery disease)   ESRD (end stage renal disease) (HCC)   Essential hypertension   Pressure injury of skin   GERD   Anemia of chronic renal disease   Discharge Condition: stable and improved. Discharge to SNF for further care and rehabilitation. Needs follow up with cardiologist in 1-2 weeks.  Diet recommendation: heart healthy diet   Filed Weights   01/19/16 2015 01/20/16 0825 01/20/16 1217  Weight: 116.6 kg (257 lb 0.9 oz) 118.5 kg (261 lb 3.9 oz) 114.8 kg (253 lb 1.4 oz)    History of present illness:  50 y.o. female with medical history significant of ESRD dialysis TTS, CAD s/p DES to LAD in June of this year.  Re-cathed for chest pain in Aug of this year which revealed patent stent with only ~35% stenosis.  Does have disease of other vessels no more than 50% stenosis (see cath in Aug on Epic for specific coronary anatomy). Had recurrent chest pain earlier this month and discharged on October 5th after a hospital visit where cards recommended no further work up.    Patient presents to the ED with c/o chest pain.  Located on left side with radiation to L arm.  Symptoms onset at 8pm.  Associated SOB, occasional nausea.  Pain is of "crushing" quality.  Nothing makes worse, NTG and morphine make better.   Hospital Course:  1-Chest pain: with heart score of 4 -found not to have acute ischemic process -troponin flatly elevated in setting of ESRD -EKG and telemetry w/o acute ischemic changes -case discussed/reviewed with cardiology service; recommended increase in her Imdur and continue  Ranexa -follow up with cardiologist in 1-2 weeks after discharge -will continue plavix, colesevelam, hydralazine, metoprolol, lisinopril   2-obesity -Body mass index is 37.37 kg/m. -low calorie and exercise discussed with patient   3-HTN: -will continue hydralazine, lisinopril, torsemide, lopressor and Imdur -advise to follow low sodium diet   4-GERD:  -continue PPI -dose adjusted for better control  5-sacral pressure ulcer -stage 1 -continue preventive measures  6-anemia of chronic disease: in patient on ESRD -continue Aranesp and IV iron as per renal service   7-physical deconditioning: -continue rehab at SNF  8-Tobacco abuse: -continue nicotine patch -cessation counseling provided  Procedures:  See below for x-ray reports   Consultations:  Nephrology  Cardiology   Discharge Exam: Vitals:   01/20/16 1145 01/20/16 1217  BP: (!) 119/46 (!) 147/75  Pulse: 68 71  Resp:  16  Temp:  98.4 F (36.9 C)    General: afebrile, denies SOB; reports still some intermittent CP. No abnormalities seen on EKG or telemetry.  Cardiovascular: S1 and S2, no rubs, no gallops Respiratory: good air movement, no wheezing  Abd: obese, no tenderness on exam, no distension, positive BS Extremities: no cyanosis or clubbing, trace edema bilaterally appreciated.  Discharge Instructions   Discharge Instructions    Diet - low sodium heart healthy    Complete by:  As directed    Discharge instructions    Complete by:  As directed    Heart healthy diet Take medications as prescribed  CBC in  1 week to follow Hgb trend  Compliance with HD therapy Arrange follow up with Cardiologist (Dr. Park Breed) in 1-2 weeks     Current Discharge Medication List    START taking these medications   Details  Darbepoetin Alfa (ARANESP) 150 MCG/0.3ML SOSY injection Inject 0.3 mLs (150 mcg total) into the vein every Saturday with hemodialysis.    ferric gluconate 125 mg in sodium chloride 0.9 % 100  mL Inject 125 mg into the vein Every Tuesday,Thursday,and Saturday with dialysis.      CONTINUE these medications which have CHANGED   Details  isosorbide mononitrate (IMDUR) 60 MG 24 hr tablet Take 1.5 tablets (90 mg total) by mouth daily. Qty: 30 tablet, Refills: 00    lisinopril (PRINIVIL,ZESTRIL) 10 MG tablet Take 1 tablet (10 mg total) by mouth daily.    omeprazole (PRILOSEC) 20 MG capsule Take 1 capsule (20 mg total) by mouth 2 (two) times daily before a meal.    oxyCODONE-acetaminophen (PERCOCET/ROXICET) 5-325 MG tablet Take 1 tablet by mouth every 6 (six) hours as needed for severe pain. Qty: 20 tablet, Refills: 0      CONTINUE these medications which have NOT CHANGED   Details  Amino Acids-Protein Hydrolys (FEEDING SUPPLEMENT, PRO-STAT SUGAR FREE 64,) LIQD Take 30 mLs by mouth 3 (three) times daily with meals.    Bismuth Tribromoph-Petrolatum (XEROFORM PETROLATUM DRESSING) PADS Apply 1 each topically every 3 (three) days.    calcium carbonate (TUMS - DOSED IN MG ELEMENTAL CALCIUM) 500 MG chewable tablet Chew 1 tablet (200 mg of elemental calcium total) by mouth 3 (three) times daily with meals. Qty: 120 tablet, Refills: 0    clopidogrel (PLAVIX) 75 MG tablet Take 75 mg by mouth daily.    colesevelam (WELCHOL) 625 MG tablet Take 1 tablet (625 mg total) by mouth 2 (two) times daily with a meal. Qty: 60 tablet, Refills: 6    hydrALAZINE (APRESOLINE) 25 MG tablet Take 25 mg by mouth daily.     insulin glargine (LANTUS) 100 UNIT/ML injection Inject 0.08 mLs (8 Units total) into the skin at bedtime. Qty: 10 mL, Refills: 11    lactulose (CHRONULAC) 10 GM/15ML solution Take 10 g by mouth daily.     levETIRAcetam (KEPPRA) 500 MG tablet Take 1 tablet (500 mg total) by mouth every morning. Qty: 30 tablet, Refills: 0    metoCLOPramide (REGLAN) 10 MG tablet Take 1 tablet (10 mg total) by mouth 4 (four) times daily. Qty: 90 tablet, Refills: 0    metoprolol (LOPRESSOR) 50 MG  tablet Take 1 tablet (50 mg total) by mouth 2 (two) times daily.    Multiple Vitamins-Minerals (DECUBI-VITE PO) Take 1 tablet by mouth daily.    mupirocin ointment (BACTROBAN) 2 % Place 1 application into the nose 2 (two) times daily. Qty: 22 g, Refills: 0    nicotine (NICODERM CQ - DOSED IN MG/24 HOURS) 14 mg/24hr patch Place 14 mg onto the skin daily.    nitroGLYCERIN (NITROSTAT) 0.4 MG SL tablet Place 0.4 mg under the tongue every 5 (five) minutes as needed for chest pain. Reported on 07/10/2015    nystatin (MYCOSTATIN) powder Apply 1 g topically 2 (two) times daily.    ondansetron (ZOFRAN ODT) 4 MG disintegrating tablet Take 1 tablet (4 mg total) by mouth every 6 (six) hours as needed for nausea or vomiting. Qty: 20 tablet, Refills: 0    polyethylene glycol (MIRALAX / GLYCOLAX) packet Take 17 g by mouth daily. Qty: 14 each, Refills: 0  ranolazine (RANEXA) 500 MG 12 hr tablet Take 1 tablet (500 mg total) by mouth 2 (two) times daily. Qty: 60 tablet, Refills: 0    risperiDONE (RISPERDAL) 1 MG tablet Take 1 mg by mouth 3 (three) times daily.     senna (SENOKOT) 8.6 MG TABS tablet Take 2 tablets by mouth 2 (two) times daily.     torsemide (DEMADEX) 100 MG tablet Take 100 mg by mouth daily.       Allergies  Allergen Reactions  . Cephalosporins Anaphylaxis    Patient has tolerated meropenem.   [name] Penicillins Anaphylaxis and Other (See Comments)    Has patient had a PCN reaction causing immediate rash, facial/tongue/throat swelling, SOB or lightheadedness with hypotension: Yes Has patient had a PCN reaction causing severe rash involving mucus membranes or skin necrosis: No Has patient had a PCN reaction that required hospitalization No Has patient had a PCN reaction occurring within the last 10 years: No If all of the above answers are "NO", then may proceed with Cephalosporin use.  . Lamictal [Lamotrigine] Other (See Comments)    Reaction:  Hallucinations  . Phenergan  [Promethazine Hcl] Nausea And Vomiting  . Pravastatin Other (See Comments)    Reaction:  Muscle pain   . Sulfa Antibiotics Other (See Comments)    Reaction:  Unknown     The results of significant diagnostics from this hospitalization (including imaging, microbiology, ancillary and laboratory) are listed below for reference.    Significant Diagnostic Studies: Dg Chest 1 View  Result Date: 01/02/2016 CLINICAL DATA:  Status post dialysis catheter placement today. EXAM: CHEST 1 VIEW COMPARISON:  Single-view of the chest 12/25/2015. FINDINGS: New left IJ approach dialysis catheter is in place with the tip projecting at the superior cavoatrial junction. No pneumothorax. Right IJ dialysis catheter present on the prior exam has been removed. Lungs are clear. Heart size is normal. No pleural fluid. IMPRESSION: Tip of new dialysis catheter projects at the superior cavoatrial junction. Negative for pneumothorax. Electronically Signed   By: Drusilla Kanner M.D.   On: 01/02/2016 10:27   Dg Chest 2 View  Result Date: 01/19/2016 CLINICAL DATA:  Chest pain and dyspnea since a o'clock. History post dialysis angina. EXAM: CHEST  2 VIEW COMPARISON:  01/02/2016 FINDINGS: Left IJ dialysis catheter tip terminates in the distal SVC. Hazy opacity with obscuration of left hemidiaphragm consistent with small layering effusion. Cardiac silhouette is normal. Atheromatous calcification of the aorta. No aneurysm. Mild vascular congestion. IMPRESSION: Mild vascular congestion with small left pleural effusion. Superimposed infiltrate at the left lung base would be difficult to entirely exclude. Electronically Signed   By: Tollie Eth M.D.   On: 01/19/2016 04:22   Dg Chest 2 View  Result Date: 12/24/2015 CLINICAL DATA:  Chest pain this morning. Previous coronary artery stent placement. EXAM: CHEST  2 VIEW COMPARISON:  12/07/2015. FINDINGS: Stable enlarged cardiac silhouette. The right jugular catheter tip remains in the  superior vena cava. The interstitial markings are minimally prominent. Otherwise, clear lungs. Small to moderate-sized pleural effusion posteriorly, most likely on the left. Aortic arch calcification. Mild thoracic spine degenerative changes. Minimal bilateral shoulder degenerative changes. IMPRESSION: 1. Small to moderate size left pleural effusion posteriorly at the lung bases. 2. Mild aortic atherosclerosis. 3. Minimal chronic interstitial lung disease. Electronically Signed   By: Beckie Salts M.D.   On: 12/24/2015 08:45   Dg Abd 1 View  Result Date: 01/02/2016 CLINICAL DATA:  50 year old female with abdominal pain and constipation. EXAM:  ABDOMEN - 1 VIEW COMPARISON:  Radiograph dated 07/31/2015 FINDINGS: Large amount of stool noted throughout the visualized colon. Dense stool noted in the rectosigmoid. No bowel dilatation or evidence of obstruction. There is no free air or radiopaque calculi. The soft tissues and osseous structures appear unremarkable. IMPRESSION: Constipation.  No bowel obstruction. Electronically Signed   By: Elgie CollardArash  Radparvar M.D.   On: 01/02/2016 20:03   Ct Head Wo Contrast  Result Date: 12/30/2015 CLINICAL DATA:  Altered mental status EXAM: CT HEAD WITHOUT CONTRAST TECHNIQUE: Contiguous axial images were obtained from the base of the skull through the vertex without intravenous contrast. COMPARISON:  07/21/2015 FINDINGS: Brain: Mild global atrophy. Chronic ischemic changes in the periventricular white matter. No mass effect, midline shift, or acute hemorrhage. Vascular: Carotid siphon calcifications Skull: Intact Sinuses/Orbits: Mucosal thickening in the left maxillary sinus. Other: Noncontributory IMPRESSION: No acute intracranial pathology.  Chronic changes. Electronically Signed   By: Jolaine ClickArthur  Hoss M.D.   On: 12/30/2015 14:31   Dg Chest Portable 1 View  Result Date: 12/25/2015 CLINICAL DATA:  Chest pain since last night. EXAM: PORTABLE CHEST 1 VIEW COMPARISON:  December 24, 2015 FINDINGS: The heart size and mediastinal contours are stable. Right central venous sinus unchanged distal tip in superior vena cava. There is no focal infiltrate, pulmonary edema, or pleural effusion. The visualized skeletal structures are stable. IMPRESSION: No active cardiopulmonary disease. Electronically Signed   By: Sherian ReinWei-Chen  Lin M.D.   On: 12/25/2015 15:54    Labs: Basic Metabolic Panel:  Recent Labs Lab 01/19/16 0122 01/20/16 0820  NA 136 137  K 3.1* 3.6  CL 98* 99*  CO2 31 31  GLUCOSE 169* 107*  BUN 20 28*  CREATININE 3.15* 3.91*  CALCIUM 8.7* 9.2  PHOS  --  4.7*   Liver Function Tests:  Recent Labs Lab 01/20/16 0820  ALBUMIN 1.9*   CBC:  Recent Labs Lab 01/19/16 0122 01/20/16 0820  WBC 7.6 8.2  NEUTROABS 5.3  --   HGB 7.8* 7.3*  HCT 25.8* 24.1*  MCV 90.5 90.9  PLT 448* 456*   Cardiac Enzymes:  Recent Labs Lab 01/19/16 0122 01/19/16 0455 01/19/16 0801 01/19/16 1124  TROPONINI 0.04* 0.04* 0.04* 0.03*   BNP: BNP (last 3 results)  Recent Labs  10/05/15 2157  BNP 1,751.0*   CBG:  Recent Labs Lab 01/19/16 1705 01/19/16 2011 01/20/16 0004 01/20/16 0353 01/20/16 1250  GLUCAP 151* 131* 160* 146* 106*    Signed:  Vassie LollMadera, Daleyza Gadomski MD.  [facility] 01/20/2016, 4:12 PM

## 2016-01-20 NOTE — Progress Notes (Signed)
Consult note from Dr Clifton James reviewed, no additional recommendations at this time. Please call over weekend with questions.   Dominga Ferry MD

## 2016-01-20 NOTE — Procedures (Signed)
I have seen and examined this patient and agree with the plan of care   Admitted with chest pains CAD s/p DES to mid LAD in setting of NSTEMI in June 2017, also with history of stenting to the RCA, HLD, ESRD on HD, DM, and CHF.    Trop 0.04 -- 0.04 -- 0.03   Seen on dialysis  BP  95/52   TDC  Site clean and dry  Removing 4 L   K 3.6    CO2 31    Ca  9.2    Phos 4.7      Hb 7.3    Low Tsats  11 %  Start iron and darbepoietin will    BP very labile  Will modify hypertensives        Kimberly Montgomery W 01/20/2016, 10:41 AM

## 2016-01-22 ENCOUNTER — Non-Acute Institutional Stay (SKILLED_NURSING_FACILITY): Payer: Medicare HMO | Admitting: Internal Medicine

## 2016-01-22 ENCOUNTER — Other Ambulatory Visit: Payer: Self-pay | Admitting: *Deleted

## 2016-01-22 ENCOUNTER — Encounter: Payer: Self-pay | Admitting: Internal Medicine

## 2016-01-22 DIAGNOSIS — Z992 Dependence on renal dialysis: Secondary | ICD-10-CM

## 2016-01-22 DIAGNOSIS — I739 Peripheral vascular disease, unspecified: Secondary | ICD-10-CM

## 2016-01-22 DIAGNOSIS — E440 Moderate protein-calorie malnutrition: Secondary | ICD-10-CM | POA: Diagnosis not present

## 2016-01-22 DIAGNOSIS — I25119 Atherosclerotic heart disease of native coronary artery with unspecified angina pectoris: Secondary | ICD-10-CM

## 2016-01-22 DIAGNOSIS — F319 Bipolar disorder, unspecified: Secondary | ICD-10-CM | POA: Diagnosis not present

## 2016-01-22 DIAGNOSIS — K5909 Other constipation: Secondary | ICD-10-CM

## 2016-01-22 DIAGNOSIS — D638 Anemia in other chronic diseases classified elsewhere: Secondary | ICD-10-CM

## 2016-01-22 DIAGNOSIS — N186 End stage renal disease: Secondary | ICD-10-CM

## 2016-01-22 DIAGNOSIS — M545 Low back pain: Secondary | ICD-10-CM

## 2016-01-22 DIAGNOSIS — K219 Gastro-esophageal reflux disease without esophagitis: Secondary | ICD-10-CM

## 2016-01-22 DIAGNOSIS — R5381 Other malaise: Secondary | ICD-10-CM | POA: Diagnosis not present

## 2016-01-22 DIAGNOSIS — I5032 Chronic diastolic (congestive) heart failure: Secondary | ICD-10-CM | POA: Diagnosis not present

## 2016-01-22 DIAGNOSIS — G8929 Other chronic pain: Secondary | ICD-10-CM

## 2016-01-22 MED ORDER — OXYCODONE-ACETAMINOPHEN 5-325 MG PO TABS: ORAL_TABLET | ORAL | 0 refills | Status: DC

## 2016-01-22 NOTE — Telephone Encounter (Signed)
[facility] [phone] Fax: [phone]  

## 2016-01-22 NOTE — Progress Notes (Signed)
[facility]  PCP: Jarome MatinALE, PATRICK D, MD   Code Status: Full Code  Goals of care: Advanced Directive information Advanced Directives 01/19/2016  Does patient have an advance directive? No  Would patient like information on creating an advanced directive? No - patient declined information       Extended Emergency Contact Information Primary Emergency Contact: Dixon,Heather N Address: [address]          [place], [place] [address] [place] Mobile Phone: [phone] Relation: Daughter   Allergies  Allergen Reactions  . Cephalosporins Anaphylaxis    Patient has tolerated meropenem.   [name]. Penicillins Anaphylaxis and Other (See Comments)    Has patient had a PCN reaction causing immediate rash, facial/tongue/throat swelling, SOB or lightheadedness with hypotension: Yes Has patient had a PCN reaction causing severe rash involving mucus membranes or skin necrosis: No Has patient had a PCN reaction that required hospitalization No Has patient had a PCN reaction occurring within the last 10 years: No If all of the above answers are "NO", then may proceed with Cephalosporin use.  . Lamictal [Lamotrigine] Other (See Comments)    Reaction:  Hallucinations  . Phenergan [Promethazine Hcl] Nausea And Vomiting  . Pravastatin Other (See Comments)    Reaction:  Muscle pain   . Sulfa Antibiotics Other (See Comments)    Reaction:  Unknown     Chief Complaint  Patient presents with  . Readmit To SNF    Readmission Visit     HPI:  Patient is a 50 y.o. female seen today for readmission post hospital admission from 01/19/16-01/20/16 with chest pain. Acute infarct/ ischemia was ruled out. She was seen by cardiology and her imdur dosing was increased. She was at this facility undergoing rehabilitation post hospital admission from 12/25/15-01/04/16 with chest pain and enterobacter bacteremia. Of note, she underwent cardiac catheterization recently and it was negative  for restenosis of her stent. She has PMH of ESRD on HD, PVD, DM type 2 among others. She is seen in her room today.   Review of Systems:  Constitutional: Negative for fever, chills, diaphoresis. Feels weak and tired. HENT: Negative for headache, congestion, nasal discharge Eyes: Negative for blurred vision, double vision and discharge.  Respiratory: Negative for cough, shortness of breath  Cardiovascular: Negative for chest pain, palpitations, leg swelling.  Gastrointestinal: Negative for heartburn, nausea, vomiting, abdominal pain. Last bowel movement was yesterday. No rectal bleed Genitourinary: Negative for dysuria and flank pain.  Musculoskeletal: Negative for fall in the facility. Positive for lower back pain.  Skin: Negative for itching, rash.  Neurological: Negative for dizziness. Psychiatric/Behavioral: Negative for depression   Past Medical History:  Diagnosis Date  . Anginal pain (HCC)   . Anxiety   . Asthma   . Bipolar disorder (HCC)   . CAD (coronary artery disease)   . CHF (congestive heart failure) (HCC)   . Chronic lower back pain   . Depression   . Endometriosis   . ESRD (end stage renal disease) on dialysis (HCC)    "[facility]; [address]; [place]; TTS" (01/19/2016)  . Gastroparesis   . GERD (gastroesophageal reflux disease)   . History of blood transfusion "several"   "my blood would get low; low RBC"  . History of hiatal hernia   . HLD (hyperlipidemia)   . Hypertension   . Migraine    "monthly" (01/19/2016)  . Myocardial infarction 2017   "~ 3 wks ago" (01/19/2016)  . Renal disorder   .  Seizures (HCC) 07/2015   "I've only had the 1; don't know what from" (01/19/2016)  . Type II diabetes mellitus (HCC)    Past Surgical History:  Procedure Laterality Date  . ABDOMINAL HYSTERECTOMY     "partial"  . BELOW KNEE LEG AMPUTATION Right 2010?  [name] CARDIAC CATHETERIZATION Right 07/10/2015   Procedure: Left Heart Cath and Coronary Angiography;  Surgeon: Laurier Nancy, MD;  Location: [facility];  Service: Cardiovascular;  Laterality: Right;  . CARDIAC CATHETERIZATION Right 09/14/2015   Procedure: Left Heart Cath and Coronary Angiography;  Surgeon: Laurier Nancy, MD;  Location: [facility];  Service: Cardiovascular;  Laterality: Right;  . CARDIAC CATHETERIZATION N/A 09/14/2015   Procedure: Coronary Stent Intervention;  Surgeon: Alwyn Pea, MD;  Location: [facility];  Service: Cardiovascular;  Laterality: N/A;  . CARDIAC CATHETERIZATION Right 11/20/2015   Procedure: Left Heart Cath and Coronary Angiography;  Surgeon: Laurier Nancy, MD;  Location: [facility];  Service: Cardiovascular;  Laterality: Right;  . CORONARY ANGIOPLASTY WITH STENT PLACEMENT  <2017   @ [facility]/notes 07/03/2013  . HYSTEROTOMY    . PERIPHERAL VASCULAR CATHETERIZATION N/A 08/30/2015   Procedure: Dialysis/Perma Catheter Insertion;  Surgeon: Annice Needy, MD;  Location: [facility];  Service: Cardiovascular;  Laterality: N/A;  . PERIPHERAL VASCULAR CATHETERIZATION N/A 01/01/2016   Procedure: Dialysis/Perma Catheter Insertion;  Surgeon: Annice Needy, MD;  Location: [facility];  Service: Cardiovascular;  Laterality: N/A;  . PERITONEAL CATHETER INSERTION  11/03/2013   [name] 11/03/2013  . PERITONEAL CATHETER REMOVAL  01/01/2016   "took the one from May out; put new PD cath in" (01/19/2016)  . PERITONEAL CATHETER REMOVAL  11/03/2013; 02/11/2014   [name] 11/03/2013; Removal of tunneled catheter/notes 02/11/2014  . SALPINGOOPHORECTOMY Right    [name] 07/03/2013  . TUBAL LIGATION     Social History:   reports that she quit smoking about 6 months ago. Her smoking use included Cigarettes. She smoked 0.75 packs per day. She has never used smokeless tobacco. She reports that she drinks alcohol. She reports that she does not use drugs.  Family History  Problem Relation Age of Onset  . CAD    . Diabetes    . Bipolar disorder    . Cervical  cancer Mother     Medications:   Medication List       Accurate as of 01/22/16 12:29 PM. Always use your most recent med list.          calcium carbonate 500 MG chewable tablet Commonly known as:  TUMS - dosed in mg elemental calcium Chew 1 tablet (200 mg of elemental calcium total) by mouth 3 (three) times daily with meals.   clopidogrel 75 MG tablet Commonly known as:  PLAVIX Take 75 mg by mouth daily.   colesevelam 625 MG tablet Commonly known as:  WELCHOL Take 1 tablet (625 mg total) by mouth 2 (two) times daily with a meal.   DECUBI-VITE PO Take 1 tablet by mouth daily.   feeding supplement (PRO-STAT SUGAR FREE 64) Liqd Take 30 mLs by mouth 3 (three) times daily with meals.   hydrALAZINE 25 MG tablet Commonly known as:  APRESOLINE Take 25 mg by mouth daily.   insulin glargine 100 UNIT/ML injection Commonly known as:  LANTUS Inject 0.08 mLs (8 Units total) into the skin at bedtime.   isosorbide mononitrate 60 MG 24 hr tablet Commonly known as:  IMDUR Take  1.5 tablets (90 mg total) by mouth daily.   lactulose 10 GM/15ML solution Commonly known as:  CHRONULAC Take 10 g by mouth daily.   levETIRAcetam 500 MG tablet Commonly known as:  KEPPRA Take 1 tablet (500 mg total) by mouth every morning.   lisinopril 10 MG tablet Commonly known as:  PRINIVIL,ZESTRIL Take 1 tablet (10 mg total) by mouth daily.   metoCLOPramide 10 MG tablet Commonly known as:  REGLAN Take 1 tablet (10 mg total) by mouth 4 (four) times daily.   metoprolol 50 MG tablet Commonly known as:  LOPRESSOR Take 1 tablet (50 mg total) by mouth 2 (two) times daily.   mupirocin ointment 2 % Commonly known as:  BACTROBAN Place 1 application into the nose 2 (two) times daily.   nicotine 14 mg/24hr patch Commonly known as:  NICODERM CQ - dosed in mg/24 hours Place 14 mg onto the skin daily.   nitroGLYCERIN 0.4 MG SL tablet Commonly known as:  NITROSTAT Place 0.4 mg under the tongue every  5 (five) minutes as needed for chest pain. Reported on 07/10/2015   nitroGLYCERIN 0.4 mg/hr patch Commonly known as:  NITRODUR - Dosed in mg/24 hr Place 0.4 mg onto the skin as needed.   nystatin powder Commonly known as:  MYCOSTATIN/NYSTOP Apply 1 g topically 2 (two) times daily.   omeprazole 20 MG capsule Commonly known as:  PRILOSEC Take 20 mg by mouth daily.   ondansetron 4 MG disintegrating tablet Commonly known as:  ZOFRAN ODT Take 1 tablet (4 mg total) by mouth every 6 (six) hours as needed for nausea or vomiting.   oxyCODONE-acetaminophen 5-325 MG tablet Commonly known as:  PERCOCET/ROXICET Take 1 tablet by mouth every 6 (six) hours as needed for severe pain.   polyethylene glycol packet Commonly known as:  MIRALAX / GLYCOLAX Take 17 g by mouth daily.   ranolazine 500 MG 12 hr tablet Commonly known as:  RANEXA Take 1 tablet (500 mg total) by mouth 2 (two) times daily.   risperiDONE 1 MG tablet Commonly known as:  RISPERDAL Take 1 mg by mouth 3 (three) times daily.   senna 8.6 MG Tabs tablet Commonly known as:  SENOKOT Take 2 tablets by mouth 2 (two) times daily.   torsemide 100 MG tablet Commonly known as:  DEMADEX Take 100 mg by mouth daily.   XEROFORM PETROLATUM DRESSING Pads Apply 1 each topically every 3 (three) days.       Immunizations: There is no immunization history for the selected administration types on file for this patient.   Physical Exam: Vitals:   01/22/16 1218  BP: 121/72  Pulse: 71  Resp: 18  Temp: 97.5 F (36.4 C)  TempSrc: Oral  SpO2: 100%  Weight: 253 lb (114.8 kg)  Height: 5\' 9"  (1.753 m)   Body mass index is 37.36 kg/m.  General- adult female, obese, in no acute distress Head- normocephalic, atraumatic Nose- no nasal discharge Throat- moist mucus membrane Eyes- PERRLA, EOMI, no pallor, no icterus Neck- no cervical lymphadenopathy Cardiovascular- normal s1,s2, no murmur, no leg edema Respiratory- bilateral clear  to auscultation, no wheeze, no rhonchi, no crackles, no use of accessory muscles. On 2 l o2 by [place] Abdomen- bowel sounds present, soft, non tender, PD catheter on right abdominal quadrant Musculoskeletal- right BKA, able to move all other 3 extremities, generalized weakness Neurological- alert and oriented to person, place and time Skin- warm and dry Psychiatry- normal mood and affect    Labs reviewed: Basic  Metabolic Panel:  Recent Labs  16/10/96 0547  07/21/15 0427  11/19/15 1547  01/04/16 0407 01/04/16 1055 01/10/16 01/19/16 0122 01/20/16 0820  NA 140  < > 128*  < >  --   < > 132* 130* 138 136 137  K 4.7  < > 4.2  < >  --   < > 3.7 3.9 3.5 3.1* 3.6  CL 109  < > 91*  < >  --   < > 94* 91*  --  98* 99*  CO2 22  < > 22  < >  --   < > 32 29  --  31 31  GLUCOSE 181*  < > 157*  < >  --   < > 118* 147*  --  169* 107*  BUN 64*  < > 62*  < >  --   < > 48* 51* 21 20 28*  CREATININE 7.10*  < > 9.89*  < >  --   < > 5.52* 5.58* 3.4* 3.15* 3.91*  CALCIUM 7.8*  < > 5.9*  < >  --   < > 10.5* 10.5*  --  8.7* 9.2  MG 2.4  --  1.9  --  1.9  --   --   --   --   --   --   PHOS 8.0*  8.0*  < >  --   < >  --   < > 3.3 3.5  --   --  4.7*  < > = values in this interval not displayed. Liver Function Tests:  Recent Labs  07/29/15 0424 08/23/15 2319  12/25/15 1434  01/04/16 0407 01/04/16 1055 01/10/16 01/20/16 0820  AST 18 13*  --  17  --   --   --  7*  --   ALT 12* 7*  --  9*  --   --   --   --   --   ALKPHOS 77 105  --  66  --   --   --  80  --   BILITOT 0.5 0.6  --  0.5  --   --   --   --   --   PROT 4.9* 5.4*  --  5.0*  --   --   --   --   --   ALBUMIN 1.8* 1.5*  < > 2.2*  < > 1.9* 1.9*  --  1.9*  < > = values in this interval not displayed.  Recent Labs  07/15/15 1639 08/23/15 2319  LIPASE 23 14    Recent Labs  08/23/15 2319  AMMONIA 29   CBC:  Recent Labs  12/07/15 0800  12/25/15 1434  01/04/16 1055 01/10/16 01/19/16 0122 01/20/16 0820  WBC 6.4  < > 11.0  < > 12.4*  10.9 7.6 8.2  NEUTROABS 4.1  --  9.6*  --   --   --  5.3  --   HGB 9.4*  < > 7.7*  < > 7.9* 7.8* 7.8* 7.3*  HCT 27.5*  < > 23.5*  < > 23.8* 25* 25.8* 24.1*  MCV 96.8  < > 95.2  < > 89.5  --  90.5 90.9  PLT 204  < > 226  < > 496* 495* 448* 456*  < > = values in this interval not displayed. Cardiac Enzymes:  Recent Labs  01/19/16 0455 01/19/16 0801 01/19/16 1124  TROPONINI 0.04* 0.04* 0.03*   BNP: Invalid input(s): POCBNP CBG:  Recent  Labs  01/20/16 0004 01/20/16 0353 01/20/16 1250  GLUCAP 160* 146* 106*    Radiological Exams: Dg Chest 1 View  Result Date: 01/02/2016 CLINICAL DATA:  Status post dialysis catheter placement today. EXAM: CHEST 1 VIEW COMPARISON:  Single-view of the chest 12/25/2015. FINDINGS: New left IJ approach dialysis catheter is in place with the tip projecting at the superior cavoatrial junction. No pneumothorax. Right IJ dialysis catheter present on the prior exam has been removed. Lungs are clear. Heart size is normal. No pleural fluid. IMPRESSION: Tip of new dialysis catheter projects at the superior cavoatrial junction. Negative for pneumothorax. Electronically Signed   By: Drusilla Kanner M.D.   On: 01/02/2016 10:27   Dg Chest 2 View  Result Date: 01/19/2016 CLINICAL DATA:  Chest pain and dyspnea since a o'clock. History post dialysis angina. EXAM: CHEST  2 VIEW COMPARISON:  01/02/2016 FINDINGS: Left IJ dialysis catheter tip terminates in the distal SVC. Hazy opacity with obscuration of left hemidiaphragm consistent with small layering effusion. Cardiac silhouette is normal. Atheromatous calcification of the aorta. No aneurysm. Mild vascular congestion. IMPRESSION: Mild vascular congestion with small left pleural effusion. Superimposed infiltrate at the left lung base would be difficult to entirely exclude. Electronically Signed   By: Tollie Eth M.D.   On: 01/19/2016 04:22   Dg Chest 2 View  Result Date: 12/24/2015 CLINICAL DATA:  Chest pain this  morning. Previous coronary artery stent placement. EXAM: CHEST  2 VIEW COMPARISON:  12/07/2015. FINDINGS: Stable enlarged cardiac silhouette. The right jugular catheter tip remains in the superior vena cava. The interstitial markings are minimally prominent. Otherwise, clear lungs. Small to moderate-sized pleural effusion posteriorly, most likely on the left. Aortic arch calcification. Mild thoracic spine degenerative changes. Minimal bilateral shoulder degenerative changes. IMPRESSION: 1. Small to moderate size left pleural effusion posteriorly at the lung bases. 2. Mild aortic atherosclerosis. 3. Minimal chronic interstitial lung disease. Electronically Signed   By: Beckie Salts M.D.   On: 12/24/2015 08:45   Dg Abd 1 View  Result Date: 01/02/2016 CLINICAL DATA:  50 year old female with abdominal pain and constipation. EXAM: ABDOMEN - 1 VIEW COMPARISON:  Radiograph dated 07/31/2015 FINDINGS: Large amount of stool noted throughout the visualized colon. Dense stool noted in the rectosigmoid. No bowel dilatation or evidence of obstruction. There is no free air or radiopaque calculi. The soft tissues and osseous structures appear unremarkable. IMPRESSION: Constipation.  No bowel obstruction. Electronically Signed   By: Elgie Collard M.D.   On: 01/02/2016 20:03   Ct Head Wo Contrast  Result Date: 12/30/2015 CLINICAL DATA:  Altered mental status EXAM: CT HEAD WITHOUT CONTRAST TECHNIQUE: Contiguous axial images were obtained from the base of the skull through the vertex without intravenous contrast. COMPARISON:  07/21/2015 FINDINGS: Brain: Mild global atrophy. Chronic ischemic changes in the periventricular white matter. No mass effect, midline shift, or acute hemorrhage. Vascular: Carotid siphon calcifications Skull: Intact Sinuses/Orbits: Mucosal thickening in the left maxillary sinus. Other: Noncontributory IMPRESSION: No acute intracranial pathology.  Chronic changes. Electronically Signed   By: Jolaine Click M.D.   On: 12/30/2015 14:31   Dg Chest Portable 1 View  Result Date: 12/25/2015 CLINICAL DATA:  Chest pain since last night. EXAM: PORTABLE CHEST 1 VIEW COMPARISON:  December 24, 2015 FINDINGS: The heart size and mediastinal contours are stable. Right central venous sinus unchanged distal tip in superior vena cava. There is no focal infiltrate, pulmonary edema, or pleural effusion. The visualized skeletal structures are stable. IMPRESSION: No active  cardiopulmonary disease. Electronically Signed   By: Sherian Rein M.D.   On: 12/25/2015 15:54    Assessment/Plan  Physical deconditioning Will have her work with physical therapy and occupational therapy team to help with gait training and muscle strengthening exercises.fall precautions. Skin care. Encourage to be out of bed.   CAD Chest pain free. Continue plavix 75 mg daily with hydralazine 25 mg daily, lopressor 50 mg bid imdur 90 mg daily and ranexa 500 mg bid. Cardiology follow up. Continue prn SL NTG  CHF Continue torsemide 100 mg daily, hydralazine 25 mg daily, lopressor 50 mg bid, lisinopril 10 mg, imdur 90 mg daily . Monitor her breathing, weight. Continue o2 by [place]  Chronic low back pain Continue percocet 5-325 mg 1 tab q6h prn pain. Will have patient work with PT/OT as tolerated to regain strength and restore function.  Fall precautions are in place.  Protein calorie malnutrition Monitor po intake and weight, RD to evaluate, continue feeding supplement  gerd Stable symptom, continue omeprazole  Chronic constipation Continue senna for now with miralax  PVD S/p RLE BKA. Continue plavix and percocet 5-325 mg q6h prn phantom pain  Anemia of chronic disease Monitor cbc.   ESRD On HD 3 days a week.   Bipolar 1 disorder Stable. Continue keppra and risperdal   Goals of care: long term care   Labs/tests ordered: cbc, cmp  Family/ staff Communication: reviewed care plan with patient and nursing  supervisor    Oneal Grout, MD Internal Medicine [facility] [address] [place], [place] [address] Cell Phone (Monday-Friday 8 am - 5 pm): [phone] On Call: [phone] and follow prompts after 5 pm and on weekends Office Phone: [phone] Office Fax: [phone]  \

## 2016-01-26 LAB — BASIC METABOLIC PANEL
BUN: 23 mg/dL — AB (ref 4–21)
Creatinine: 3.5 mg/dL — AB (ref 0.5–1.1)
GLUCOSE: 159 mg/dL
POTASSIUM: 3.6 mmol/L (ref 3.4–5.3)
SODIUM: 136 mmol/L — AB (ref 137–147)

## 2016-01-26 LAB — CBC AND DIFFERENTIAL
HEMATOCRIT: 28 % — AB (ref 36–46)
HEMOGLOBIN: 8.6 g/dL — AB (ref 12.0–16.0)
Platelets: 412 10*3/uL — AB (ref 150–399)
WBC: 7.7 10^3/mL

## 2016-01-26 LAB — HEPATIC FUNCTION PANEL
ALT: 3 U/L — AB (ref 7–35)
AST: 8 U/L — AB (ref 13–35)
Alkaline Phosphatase: 88 U/L (ref 25–125)
Bilirubin, Total: 0.2 mg/dL

## 2016-01-30 ENCOUNTER — Non-Acute Institutional Stay: Payer: Medicare HMO | Admitting: Family

## 2016-01-30 DIAGNOSIS — N186 End stage renal disease: Secondary | ICD-10-CM | POA: Diagnosis not present

## 2016-01-30 DIAGNOSIS — E440 Moderate protein-calorie malnutrition: Secondary | ICD-10-CM

## 2016-01-30 DIAGNOSIS — Z992 Dependence on renal dialysis: Secondary | ICD-10-CM

## 2016-01-30 NOTE — Progress Notes (Signed)
Location:  [facility] Room Number: 408 Place of Service:  SNF [phone] Provider:  Marshall Kampf FNP-C   DALE, Jordan HawksPATRICK D, MD  Patient Care Team: Jarome MatinPatrick D Dale, MD as PCP - General Laurier NancyShaukat A Khan, MD as Consulting Physician (Cardiology) Mady HaagensenMunsoor Lateef, MD as Consulting Physician (Internal Medicine)  Extended Emergency Contact Information Primary Emergency Contact: Dixon,Heather N Address: [address]          [place], [place] [address] [place] Mobile Phone: [phone] Relation: Daughter  Code Status:  Full Code  Goals of care: Advanced Directive information Advanced Directives 01/19/2016  Does patient have an advance directive? No  Would patient like information on creating an advanced directive? No - patient declined information     Chief Complaint  Patient presents with  . Acute Visit    abnormal lab results     HPI:  Pt is a 50 y.o. female seen today at [facility] for an acute visit for evaluation of abnormal lab results. She has a significant medical history of ESRD on dialysis, HTN, CAD, CHF, GERD, Type 2 DM among others. She is seen in her room today. She denies any acute issues this visit except just tired from dialysis. Her recent lab results showed CR 3.49, TP 5.1 and Alb 2.36 ( 01/26/2016.Facility staff reports no new concerns.    Past Medical History:  Diagnosis Date  . Anginal pain (HCC)   . Anxiety   . Asthma   . Bipolar disorder (HCC)   . CAD (coronary artery disease)   . CHF (congestive heart failure) (HCC)   . Chronic lower back pain   . Depression   . Endometriosis   . ESRD (end stage renal disease) on dialysis (HCC)    "[facility]; [address]; [place]; TTS" (01/19/2016)  . Gastroparesis   . GERD (gastroesophageal reflux disease)   . History of blood transfusion "several"   "my blood would get low; low RBC"  . History of hiatal hernia   . HLD (hyperlipidemia)   . Hypertension   .  Migraine    "monthly" (01/19/2016)  . Myocardial infarction 2017   "~ 3 wks ago" (01/19/2016)  . Renal disorder   . Seizures (HCC) 07/2015   "I've only had the 1; don't know what from" (01/19/2016)  . Type II diabetes mellitus (HCC)    Past Surgical History:  Procedure Laterality Date  . ABDOMINAL HYSTERECTOMY     "partial"  . BELOW KNEE LEG AMPUTATION Right 2010?  [name]. CARDIAC CATHETERIZATION Right 07/10/2015   Procedure: Left Heart Cath and Coronary Angiography;  Surgeon: Laurier NancyShaukat A Khan, MD;  Location: [facility] INVASIVE CV LAB;  Service: Cardiovascular;  Laterality: Right;  . CARDIAC CATHETERIZATION Right 09/14/2015   Procedure: Left Heart Cath and Coronary Angiography;  Surgeon: Laurier NancyShaukat A Khan, MD;  Location: [facility] INVASIVE CV LAB;  Service: Cardiovascular;  Laterality: Right;  . CARDIAC CATHETERIZATION N/A 09/14/2015   Procedure: Coronary Stent Intervention;  Surgeon: Alwyn Peawayne D Callwood, MD;  Location: [facility] INVASIVE CV LAB;  Service: Cardiovascular;  Laterality: N/A;  . CARDIAC CATHETERIZATION Right 11/20/2015   Procedure: Left Heart Cath and Coronary Angiography;  Surgeon: Laurier NancyShaukat A Khan, MD;  Location: [facility] INVASIVE CV LAB;  Service: Cardiovascular;  Laterality: Right;  . CORONARY ANGIOPLASTY WITH STENT PLACEMENT  <2017   @ [facility]/notes 07/03/2013  . HYSTEROTOMY    . PERIPHERAL VASCULAR CATHETERIZATION N/A 08/30/2015   Procedure: Dialysis/Perma Catheter Insertion;  Surgeon: Annice Needy, MD;  Location: [facility] INVASIVE CV LAB;  Service: Cardiovascular;  Laterality: N/A;  . PERIPHERAL VASCULAR CATHETERIZATION N/A 01/01/2016   Procedure: Dialysis/Perma Catheter Insertion;  Surgeon: Annice Needy, MD;  Location: [facility] INVASIVE CV LAB;  Service: Cardiovascular;  Laterality: N/A;  . PERITONEAL CATHETER INSERTION  11/03/2013   [name] 11/03/2013  . PERITONEAL CATHETER REMOVAL  01/01/2016   "took the one from May out; put new PD cath in" (01/19/2016)  . PERITONEAL CATHETER REMOVAL  11/03/2013; 02/11/2014   [name]  11/03/2013; Removal of tunneled catheter/notes 02/11/2014  . SALPINGOOPHORECTOMY Right    [name] 07/03/2013  . TUBAL LIGATION      Allergies  Allergen Reactions  . Cephalosporins Anaphylaxis    Patient has tolerated meropenem.   [name] Penicillins Anaphylaxis and Other (See Comments)    Has patient had a PCN reaction causing immediate rash, facial/tongue/throat swelling, SOB or lightheadedness with hypotension: Yes Has patient had a PCN reaction causing severe rash involving mucus membranes or skin necrosis: No Has patient had a PCN reaction that required hospitalization No Has patient had a PCN reaction occurring within the last 10 years: No If all of the above answers are "NO", then may proceed with Cephalosporin use.  . Lamictal [Lamotrigine] Other (See Comments)    Reaction:  Hallucinations  . Phenergan [Promethazine Hcl] Nausea And Vomiting  . Pravastatin Other (See Comments)    Reaction:  Muscle pain   . Sulfa Antibiotics Other (See Comments)    Reaction:  Unknown       Medication List       Accurate as of 01/30/16  9:20 PM. Always use your most recent med list.          calcium carbonate 500 MG chewable tablet Commonly known as:  TUMS - dosed in mg elemental calcium Chew 1 tablet (200 mg of elemental calcium total) by mouth 3 (three) times daily with meals.   clopidogrel 75 MG tablet Commonly known as:  PLAVIX Take 75 mg by mouth daily.   colesevelam 625 MG tablet Commonly known as:  WELCHOL Take 1 tablet (625 mg total) by mouth 2 (two) times daily with a meal.   feeding supplement (PRO-STAT SUGAR FREE 64) Liqd Take 30 mLs by mouth 3 (three) times daily with meals.   hydrALAZINE 25 MG tablet Commonly known as:  APRESOLINE Take 25 mg by mouth daily.   insulin glargine 100 UNIT/ML injection Commonly known as:  LANTUS Inject 0.08 mLs (8 Units total) into the skin at bedtime.   isosorbide mononitrate 60 MG 24 hr tablet Commonly known as:  IMDUR Take 1.5 tablets  (90 mg total) by mouth daily.   lactulose 10 GM/15ML solution Commonly known as:  CHRONULAC Take 10 g by mouth daily.   levETIRAcetam 500 MG tablet Commonly known as:  KEPPRA Take 1 tablet (500 mg total) by mouth every morning.   lisinopril 10 MG tablet Commonly known as:  PRINIVIL,ZESTRIL Take 1 tablet (10 mg total) by mouth daily.   metoCLOPramide 10 MG tablet Commonly known as:  REGLAN Take 1 tablet (10 mg total) by mouth 4 (four) times daily.   metoprolol 50 MG tablet Commonly known as:  LOPRESSOR Take 1 tablet (50 mg total) by mouth 2 (two) times daily.   multivitamin Tabs tablet Take 1 tablet by mouth daily.   mupirocin ointment 2 % Commonly known as:  BACTROBAN Place 1 application into the nose 2 (two) times daily.   nicotine 14 mg/24hr  patch Commonly known as:  NICODERM CQ - dosed in mg/24 hours Place 14 mg onto the skin daily.   nitroGLYCERIN 0.4 MG SL tablet Commonly known as:  NITROSTAT Place 0.4 mg under the tongue every 5 (five) minutes as needed for chest pain. Reported on 07/10/2015   nitroGLYCERIN 0.4 mg/hr patch Commonly known as:  NITRODUR - Dosed in mg/24 hr Place 0.4 mg onto the skin as needed.   nystatin powder Commonly known as:  MYCOSTATIN/NYSTOP Apply 1 g topically 2 (two) times daily.   omeprazole 20 MG capsule Commonly known as:  PRILOSEC Take 20 mg by mouth daily.   ondansetron 4 MG disintegrating tablet Commonly known as:  ZOFRAN ODT Take 1 tablet (4 mg total) by mouth every 6 (six) hours as needed for nausea or vomiting.   oxyCODONE-acetaminophen 5-325 MG tablet Commonly known as:  PERCOCET/ROXICET Take one tablet by mouth every 6 hours as needed for moderate pain. Do not exceed 4gm of Tylenol in 24 hours   polyethylene glycol packet Commonly known as:  MIRALAX / GLYCOLAX Take 17 g by mouth daily.   ranolazine 500 MG 12 hr tablet Commonly known as:  RANEXA Take 1 tablet (500 mg total) by mouth 2 (two) times daily.     risperiDONE 1 MG tablet Commonly known as:  RISPERDAL Take 1 mg by mouth 3 (three) times daily.   senna 8.6 MG Tabs tablet Commonly known as:  SENOKOT Take 2 tablets by mouth 2 (two) times daily.   torsemide 100 MG tablet Commonly known as:  DEMADEX Take 100 mg by mouth daily.   XEROFORM PETROLATUM DRESSING Pads Apply 1 each topically every 3 (three) days.       Review of Systems  Constitutional: Negative for activity change, appetite change, chills, fatigue and fever.  HENT: Negative for congestion, rhinorrhea, sinus pressure, sneezing and sore throat.   Eyes: Negative.   Respiratory: Negative for cough, shortness of breath and wheezing.   Cardiovascular: Negative for chest pain, palpitations and leg swelling.  Gastrointestinal: Negative for abdominal distention, abdominal pain, constipation, diarrhea, nausea and vomiting.  Endocrine: Negative.   Genitourinary:       ESRD on dialysis   Musculoskeletal: Positive for gait problem.  Skin: Negative for color change, pallor and rash.  Neurological: Negative for dizziness, seizures, syncope, light-headedness and headaches.  Psychiatric/Behavioral: Negative for agitation, confusion, hallucinations and sleep disturbance. The patient is not nervous/anxious.     There is no immunization history for the selected administration types on file for this patient. Pertinent  Health Maintenance Due  Topic Date Due  . FOOT EXAM  10/03/1973  . OPHTHALMOLOGY EXAM  10/03/1973  . PAP SMEAR  10/03/1984  . MAMMOGRAM  10/03/2013  . COLONOSCOPY  10/03/2013  . INFLUENZA VACCINE  10/31/2015  . HEMOGLOBIN A1C  03/14/2016      Vitals:   01/30/16 1600  BP: (!) 155/64  Pulse: 82  Resp: 18  Temp: 98 F (36.7 C)  SpO2: 97%  Weight: 261 lb 9.6 oz (118.7 kg)  Height: 5\' 9"  (1.753 m)   Body mass index is 38.63 kg/m. Physical Exam  Constitutional: She is oriented to person, place, and time. She appears well-developed. No distress.  Obese    HENT:  Head: Normocephalic.  Mouth/Throat: Oropharynx is clear and moist. No oropharyngeal exudate.  Eyes: Conjunctivae and EOM are normal. Pupils are equal, round, and reactive to light. No scleral icterus.  Neck: Normal range of motion. No JVD present. No thyromegaly  present.  Cardiovascular: Normal rate, regular rhythm, normal heart sounds and intact distal pulses.  Exam reveals no gallop and no friction rub.   No murmur heard. Pulmonary/Chest: Effort normal and breath sounds normal. No respiratory distress. She has no wheezes. She has no rales.  Abdominal: Soft. Bowel sounds are normal. She exhibits no distension. There is no tenderness. There is no rebound and no guarding.  Musculoskeletal: She exhibits no tenderness.  Right BKA. Left leg 1 + edema.   Lymphadenopathy:    She has no cervical adenopathy.  Neurological: She is oriented to person, place, and time.  Skin: Skin is warm and dry. No rash noted. No erythema. No pallor.  Psychiatric: She has a normal mood and affect.    Labs reviewed:  Recent Labs  05/14/15 0547  07/21/15 0427  11/19/15 1547  01/04/16 0407 01/04/16 1055 01/10/16 01/19/16 0122 01/20/16 0820  NA 140  < > 128*  < >  --   < > 132* 130* 138 136 137  K 4.7  < > 4.2  < >  --   < > 3.7 3.9 3.5 3.1* 3.6  CL 109  < > 91*  < >  --   < > 94* 91*  --  98* 99*  CO2 22  < > 22  < >  --   < > 32 29  --  31 31  GLUCOSE 181*  < > 157*  < >  --   < > 118* 147*  --  169* 107*  BUN 64*  < > 62*  < >  --   < > 48* 51* 21 20 28*  CREATININE 7.10*  < > 9.89*  < >  --   < > 5.52* 5.58* 3.4* 3.15* 3.91*  CALCIUM 7.8*  < > 5.9*  < >  --   < > 10.5* 10.5*  --  8.7* 9.2  MG 2.4  --  1.9  --  1.9  --   --   --   --   --   --   PHOS 8.0*  8.0*  < >  --   < >  --   < > 3.3 3.5  --   --  4.7*  < > = values in this interval not displayed.  Recent Labs  07/29/15 0424 08/23/15 2319  12/25/15 1434  01/04/16 0407 01/04/16 1055 01/10/16 01/20/16 0820  AST 18 13*  --  17  --    --   --  7*  --   ALT 12* 7*  --  9*  --   --   --   --   --   ALKPHOS 77 105  --  66  --   --   --  80  --   BILITOT 0.5 0.6  --  0.5  --   --   --   --   --   PROT 4.9* 5.4*  --  5.0*  --   --   --   --   --   ALBUMIN 1.8* 1.5*  < > 2.2*  < > 1.9* 1.9*  --  1.9*  < > = values in this interval not displayed.  Recent Labs  12/07/15 0800  12/25/15 1434  01/04/16 1055 01/10/16 01/19/16 0122 01/20/16 0820  WBC 6.4  < > 11.0  < > 12.4* 10.9 7.6 8.2  NEUTROABS 4.1  --  9.6*  --   --   --  5.3  --   HGB 9.4*  < > 7.7*  < > 7.9* 7.8* 7.8* 7.3*  HCT 27.5*  < > 23.5*  < > 23.8* 25* 25.8* 24.1*  MCV 96.8  < > 95.2  < > 89.5  --  90.5 90.9  PLT 204  < > 226  < > 496* 495* 448* 456*  < > = values in this interval not displayed. Lab Results  Component Value Date   TSH 8.570 (H) 11/19/2015   Lab Results  Component Value Date   HGBA1C 6.2 (H) 09/13/2015   Lab Results  Component Value Date   CHOL 215 (H) 11/19/2015   HDL 63 11/19/2015   LDLCALC 115 (H) 11/19/2015   TRIG 186 (H) 11/19/2015   CHOLHDL 3.4 11/19/2015   Assessment/Plan 1. Moderate protein-calorie malnutrition (HCC)  TP 5.1 and Alb 2.36 ( 01/26/2016). Has improved previous TP 4.1,Alb 2.15 ( 01/10/2016). Seen by RD Rena-vite initiated. Continue to monitor BMP.    2. ESRD on dialysis (HCC) CR 3.49 ( 01/26/2016. Stable previous 3.91. Continue dialysis.    Family/ staff Communication: Reviewed plan of care with patient and facility Nurse supervisor.   Labs/tests ordered: None

## 2016-02-19 ENCOUNTER — Encounter: Payer: Self-pay | Admitting: Internal Medicine

## 2016-02-19 ENCOUNTER — Non-Acute Institutional Stay (SKILLED_NURSING_FACILITY): Payer: Medicare HMO | Admitting: Internal Medicine

## 2016-02-19 DIAGNOSIS — I739 Peripheral vascular disease, unspecified: Secondary | ICD-10-CM

## 2016-02-19 DIAGNOSIS — I5032 Chronic diastolic (congestive) heart failure: Secondary | ICD-10-CM

## 2016-02-19 DIAGNOSIS — I10 Essential (primary) hypertension: Secondary | ICD-10-CM | POA: Diagnosis not present

## 2016-02-19 DIAGNOSIS — E1121 Type 2 diabetes mellitus with diabetic nephropathy: Secondary | ICD-10-CM

## 2016-02-19 NOTE — Progress Notes (Signed)
LOCATION: [facility]  PCP: Jarome Matin, MD   Code Status: Full Code  Goals of care: Advanced Directive information Advanced Directives 01/19/2016  Does patient have an advance directive? No  Would patient like information on creating an advanced directive? No - patient declined information       Extended Emergency Contact Information Primary Emergency Contact: Dixon,Heather N Address: [address]          [place], [place] [address] [place] Phone: [phone] Relation: Daughter   Allergies  Allergen Reactions  . Cephalosporins Anaphylaxis    Patient has tolerated meropenem.   [name] Penicillins Anaphylaxis and Other (See Comments)    Has patient had a PCN reaction causing immediate rash, facial/tongue/throat swelling, SOB or lightheadedness with hypotension: Yes Has patient had a PCN reaction causing severe rash involving mucus membranes or skin necrosis: No Has patient had a PCN reaction that required hospitalization No Has patient had a PCN reaction occurring within the last 10 years: No If all of the above answers are "NO", then may proceed with Cephalosporin use.  . Lamictal [Lamotrigine] Other (See Comments)    Reaction:  Hallucinations  . Phenergan [Promethazine Hcl] Nausea And Vomiting  . Pravastatin Other (See Comments)    Reaction:  Muscle pain   . Sulfa Antibiotics Other (See Comments)    Reaction:  Unknown     Chief Complaint  Patient presents with  . Medical Management of Chronic Issues    Routine Visit     HPI:  Patient is a 50 y.o. female seen today for Routine follow-up visit. She at present has preventative dressing to his sacral area. She denies any concerning this visit. On chart review her blood pressure reading has been elevated. No new concern from nursing staff. She continues to be on chronic oxygen. She is status post right below-knee amputation and needs a for work filled out to be evaluated for prosthetic. She  continues to get her hemodialysis 3 days a week.  Review of Systems:  Constitutional: Negative for fever, chills HENT: Negative for headache, congestion, nasal discharge Eyes: Negative for blurred vision, double vision and discharge.  Respiratory: Negative for cough, shortness of breath  Cardiovascular: Negative for chest pain, palpitation Gastrointestinal: Negative for heartburn, nausea, vomiting, abdominal pain.  Musculoskeletal: Negative for fall in the facility. Positive for lower back pain.  Skin: Negative for itching, rash.    Past Medical History:  Diagnosis Date  . Anginal pain (HCC)   . Anxiety   . Asthma   . Bipolar disorder (HCC)   . CAD (coronary artery disease)   . CHF (congestive heart failure) (HCC)   . Chronic lower back pain   . Depression   . Endometriosis   . ESRD (end stage renal disease) on dialysis (HCC)    "[facility]; [address]; [place]; TTS" (01/19/2016)  . Gastroparesis   . GERD (gastroesophageal reflux disease)   . History of blood transfusion "several"   "my blood would get low; low RBC"  . History of hiatal hernia   . HLD (hyperlipidemia)   . Hypertension   . Migraine    "monthly" (01/19/2016)  . Myocardial infarction 2017   "~ 3 wks ago" (01/19/2016)  . Renal disorder   . Seizures (HCC) 07/2015   "I've only had the 1; don't know what from" (01/19/2016)  . Type II diabetes mellitus (HCC)    Past Surgical History:  Procedure Laterality Date  . ABDOMINAL HYSTERECTOMY     "  partial"  . BELOW KNEE LEG AMPUTATION Right 2010?  [name]. CARDIAC CATHETERIZATION Right 07/10/2015   Procedure: Left Heart Cath and Coronary Angiography;  Surgeon: Laurier NancyShaukat A Khan, MD;  Location: [facility] INVASIVE CV LAB;  Service: Cardiovascular;  Laterality: Right;  . CARDIAC CATHETERIZATION Right 09/14/2015   Procedure: Left Heart Cath and Coronary Angiography;  Surgeon: Laurier NancyShaukat A Khan, MD;  Location: [facility] INVASIVE CV LAB;  Service: Cardiovascular;  Laterality: Right;  . CARDIAC  CATHETERIZATION N/A 09/14/2015   Procedure: Coronary Stent Intervention;  Surgeon: Alwyn Peawayne D Callwood, MD;  Location: [facility] INVASIVE CV LAB;  Service: Cardiovascular;  Laterality: N/A;  . CARDIAC CATHETERIZATION Right 11/20/2015   Procedure: Left Heart Cath and Coronary Angiography;  Surgeon: Laurier NancyShaukat A Khan, MD;  Location: [facility] INVASIVE CV LAB;  Service: Cardiovascular;  Laterality: Right;  . CORONARY ANGIOPLASTY WITH STENT PLACEMENT  <2017   @ [facility]/notes 07/03/2013  . HYSTEROTOMY    . PERIPHERAL VASCULAR CATHETERIZATION N/A 08/30/2015   Procedure: Dialysis/Perma Catheter Insertion;  Surgeon: Annice NeedyJason S Dew, MD;  Location: [facility] INVASIVE CV LAB;  Service: Cardiovascular;  Laterality: N/A;  . PERIPHERAL VASCULAR CATHETERIZATION N/A 01/01/2016   Procedure: Dialysis/Perma Catheter Insertion;  Surgeon: Annice NeedyJason S Dew, MD;  Location: [facility] INVASIVE CV LAB;  Service: Cardiovascular;  Laterality: N/A;  . PERITONEAL CATHETER INSERTION  11/03/2013   [name]/notes 11/03/2013  . PERITONEAL CATHETER REMOVAL  01/01/2016   "took the one from May out; put new PD cath in" (01/19/2016)  . PERITONEAL CATHETER REMOVAL  11/03/2013; 02/11/2014   [name]/notes 11/03/2013; Removal of tunneled catheter/notes 02/11/2014  . SALPINGOOPHORECTOMY Right    [name]/notes 07/03/2013  . TUBAL LIGATION     Social History:   reports that she quit smoking about 7 months ago. Her smoking use included Cigarettes. She smoked 0.75 packs per day. She has never used smokeless tobacco. She reports that she drinks alcohol. She reports that she does not use drugs.   Medications:   Medication List       Accurate as of 02/19/16  2:32 PM. Always use your most recent med list.          calcium carbonate 500 MG chewable tablet Commonly known as:  TUMS - dosed in mg elemental calcium Chew 1 tablet (200 mg of elemental calcium total) by mouth 3 (three) times daily with meals.   clopidogrel 75 MG tablet Commonly known as:  PLAVIX Take 75 mg by mouth daily.     colesevelam 625 MG tablet Commonly known as:  WELCHOL Take 1 tablet (625 mg total) by mouth 2 (two) times daily with a meal.   feeding supplement (PRO-STAT SUGAR FREE 64) Liqd Take 30 mLs by mouth 3 (three) times daily with meals.   hydrALAZINE 25 MG tablet Commonly known as:  APRESOLINE Take 25 mg by mouth daily.   insulin glargine 100 UNIT/ML injection Commonly known as:  LANTUS Inject 0.08 mLs (8 Units total) into the skin at bedtime.   insulin lispro 100 UNIT/ML injection Commonly known as:  HUMALOG Inject 0-10 Units into the skin 3 (three) times daily before meals.   isosorbide mononitrate 60 MG 24 hr tablet Commonly known as:  IMDUR Take 1.5 tablets (90 mg total) by mouth daily.   lactulose 10 GM/15ML solution Commonly known as:  CHRONULAC Take 10 g by mouth daily.   levETIRAcetam 500 MG tablet Commonly known as:  KEPPRA Take 1 tablet (500 mg total) by mouth every morning.   lisinopril 10 MG tablet Commonly known as:  PRINIVIL,ZESTRIL Take 1 tablet (10 mg total) by mouth daily.   metoCLOPramide 10 MG tablet Commonly known as:  REGLAN Take 1 tablet (10 mg total) by mouth 4 (four) times daily.   metoprolol 50 MG tablet Commonly known as:  LOPRESSOR Take 1 tablet (50 mg total) by mouth 2 (two) times daily.   multivitamin Tabs tablet Take 1 tablet by mouth daily.   mupirocin ointment 2 % Commonly known as:  BACTROBAN Place 1 application into the nose 2 (two) times daily.   nicotine 14 mg/24hr patch Commonly known as:  NICODERM CQ - dosed in mg/24 hours Place 14 mg onto the skin daily.   nitroGLYCERIN 0.4 MG SL tablet Commonly known as:  NITROSTAT Place 0.4 mg under the tongue every 5 (five) minutes as needed for chest pain. Reported on 07/10/2015   nitroGLYCERIN 0.4 mg/hr patch Commonly known as:  NITRODUR - Dosed in mg/24 hr Place 0.4 mg onto the skin as needed.   nystatin powder Commonly known as:  MYCOSTATIN/NYSTOP Apply 1 g topically 2 (two)  times daily.   omeprazole 20 MG capsule Commonly known as:  PRILOSEC Take 20 mg by mouth 2 (two) times daily before a meal.   ondansetron 4 MG disintegrating tablet Commonly known as:  ZOFRAN ODT Take 1 tablet (4 mg total) by mouth every 6 (six) hours as needed for nausea or vomiting.   oxyCODONE-acetaminophen 5-325 MG tablet Commonly known as:  PERCOCET/ROXICET Take one tablet by mouth every 6 hours as needed for moderate pain. Do not exceed 4gm of Tylenol in 24 hours   polyethylene glycol packet Commonly known as:  MIRALAX / GLYCOLAX Take 17 g by mouth daily.   ranolazine 500 MG 12 hr tablet Commonly known as:  RANEXA Take 1 tablet (500 mg total) by mouth 2 (two) times daily.   risperiDONE 1 MG tablet Commonly known as:  RISPERDAL Take 1 mg by mouth 3 (three) times daily.   senna 8.6 MG Tabs tablet Commonly known as:  SENOKOT Take 2 tablets by mouth 2 (two) times daily.   torsemide 100 MG tablet Commonly known as:  DEMADEX Take 100 mg by mouth daily.   XEROFORM PETROLATUM DRESSING Pads Apply 1 each topically every 3 (three) days.       Immunizations: There is no immunization history for the selected administration types on file for this patient.   Physical Exam: Vitals:   02/19/16 1417  BP: 137/69  Pulse: 84  Resp: 17  Temp: 97.8 F (36.6 C)  TempSrc: Oral  SpO2: 100%  Weight: 254 lb (115.2 kg)  Height: 5\' 9"  (1.753 m)   Body mass index is 37.51 kg/m.  General- adult female, obese, in no acute distress Head- normocephalic, atraumatic Nose- no nasal discharge Throat- moist mucus membrane Eyes- PERRLA, EOMI, no pallor, no icterus Neck- no cervical lymphadenopathy Cardiovascular- normal s1,s2, no murmur Respiratory- bilateral clear to auscultation, no wheeze, no rhonchi, no crackles, no use of accessory muscles. On 2.5 l o2 by [place] Abdomen- bowel sounds present, soft, non tender Musculoskeletal- right BKA, able to move all other 3 extremities,  generalized weakness, trace left lower extremity edema Neurological- alert and oriented to person, place and time Skin- warm and dry Psychiatry- normal mood and affect    Labs reviewed: Basic Metabolic Panel:  Recent Labs  09/81/19 0547  07/21/15 0427  11/19/15 1547  01/04/16 0407 01/04/16 1055  01/19/16 0122 01/20/16 0820 01/26/16  NA 140  < > 128*  < >  --   < >  132* 130*  < > 136 137 136*  K 4.7  < > 4.2  < >  --   < > 3.7 3.9  < > 3.1* 3.6 3.6  CL 109  < > 91*  < >  --   < > 94* 91*  --  98* 99*  --   CO2 22  < > 22  < >  --   < > 32 29  --  31 31  --   GLUCOSE 181*  < > 157*  < >  --   < > 118* 147*  --  169* 107*  --   BUN 64*  < > 62*  < >  --   < > 48* 51*  < > 20 28* 23*  CREATININE 7.10*  < > 9.89*  < >  --   < > 5.52* 5.58*  < > 3.15* 3.91* 3.5*  CALCIUM 7.8*  < > 5.9*  < >  --   < > 10.5* 10.5*  --  8.7* 9.2  --   MG 2.4  --  1.9  --  1.9  --   --   --   --   --   --   --   PHOS 8.0*  8.0*  < >  --   < >  --   < > 3.3 3.5  --   --  4.7*  --   < > = values in this interval not displayed. Liver Function Tests:  Recent Labs  07/29/15 0424 08/23/15 2319  12/25/15 1434  01/04/16 0407 01/04/16 1055 01/10/16 01/20/16 0820 01/26/16  AST 18 13*  --  17  --   --   --  7*  --  8*  ALT 12* 7*  --  9*  --   --   --   --   --  3*  ALKPHOS 77 105  --  66  --   --   --  80  --  88  BILITOT 0.5 0.6  --  0.5  --   --   --   --   --   --   PROT 4.9* 5.4*  --  5.0*  --   --   --   --   --   --   ALBUMIN 1.8* 1.5*  < > 2.2*  < > 1.9* 1.9*  --  1.9*  --   < > = values in this interval not displayed.  Recent Labs  07/15/15 1639 08/23/15 2319  LIPASE 23 14    Recent Labs  08/23/15 2319  AMMONIA 29   CBC:  Recent Labs  12/07/15 0800  12/25/15 1434  01/04/16 1055  01/19/16 0122 01/20/16 0820 01/26/16  WBC 6.4  < > 11.0  < > 12.4*  < > 7.6 8.2 7.7  NEUTROABS 4.1  --  9.6*  --   --   --  5.3  --   --   HGB 9.4*  < > 7.7*  < > 7.9*  < > 7.8* 7.3* 8.6*  HCT  27.5*  < > 23.5*  < > 23.8*  < > 25.8* 24.1* 28*  MCV 96.8  < > 95.2  < > 89.5  --  90.5 90.9  --   PLT 204  < > 226  < > 496*  < > 448* 456* 412*  < > = values in this interval not displayed. Cardiac Enzymes:  Recent  Labs  01/19/16 0455 01/19/16 0801 01/19/16 1124  TROPONINI 0.04* 0.04* 0.03*   BNP: Invalid input(s): POCBNP CBG:  Recent Labs  01/20/16 0004 01/20/16 0353 01/20/16 1250  GLUCAP 160* 146* 106*    Radiological Exams: No results found.  Assessment/Plan  Type 2 diabetes mellitus with nephropathy Check hemoglobin A1c. Schedule for diabetic foot and eye exam on a yearly basis. Continue Lantus 8 units daily at bedtime  Hypertension Blood pressure reading has been elevated on review. Currently on Lopressor 50 mg twice a day. Increase Lopressor to 75 mg twice a day. Continue hydralazine 25 mg daily, Imdur 90 mg daily and torsemide 100 mg daily with lisinopril 10 mg daily. Check blood pressure every shift for now.  CHF No clinical signs of hypervolemia. Continue torsemide 100 mg daily, hydralazine 25 mg daily, llisinopril 10 mg, imdur 90 mg daily . Monitor her breathing and weight. Continue o2 by [place] and dialysis  PVD S/p RLE BKA. Continue plavix and percocet 5-325 mg q6h prn phantom pain. Have filled out certificate of medical less acidity for prosthetics for her right knee status post amputation    Family/ staff Communication: reviewed care plan with patient and nursing supervisor    Oneal Grout, MD Internal Medicine [facility] [address] [place], [place] [address] Cell Phone (Monday-Friday 8 am - 5 pm): [phone] On Call: [phone] and follow prompts after 5 pm and on weekends Office Phone: [phone] Office Fax: [phone]

## 2016-02-25 ENCOUNTER — Emergency Department: Payer: Medicare HMO

## 2016-02-25 ENCOUNTER — Observation Stay
Admission: EM | Admit: 2016-02-25 | Discharge: 2016-02-27 | Disposition: A | Discharge status: Home Health | Payer: Medicare HMO | Attending: Internal Medicine | Admitting: Internal Medicine

## 2016-02-25 ENCOUNTER — Encounter: Payer: Self-pay | Admitting: Emergency Medicine

## 2016-02-25 DIAGNOSIS — Z992 Dependence on renal dialysis: Secondary | ICD-10-CM | POA: Insufficient documentation

## 2016-02-25 DIAGNOSIS — M6281 Muscle weakness (generalized): Secondary | ICD-10-CM

## 2016-02-25 DIAGNOSIS — I5032 Chronic diastolic (congestive) heart failure: Secondary | ICD-10-CM | POA: Insufficient documentation

## 2016-02-25 DIAGNOSIS — E1122 Type 2 diabetes mellitus with diabetic chronic kidney disease: Secondary | ICD-10-CM | POA: Insufficient documentation

## 2016-02-25 DIAGNOSIS — Z880 Allergy status to penicillin: Secondary | ICD-10-CM | POA: Insufficient documentation

## 2016-02-25 DIAGNOSIS — I132 Hypertensive heart and chronic kidney disease with heart failure and with stage 5 chronic kidney disease, or end stage renal disease: Secondary | ICD-10-CM | POA: Insufficient documentation

## 2016-02-25 DIAGNOSIS — E785 Hyperlipidemia, unspecified: Secondary | ICD-10-CM | POA: Diagnosis not present

## 2016-02-25 DIAGNOSIS — Z8249 Family history of ischemic heart disease and other diseases of the circulatory system: Secondary | ICD-10-CM | POA: Diagnosis not present

## 2016-02-25 DIAGNOSIS — F319 Bipolar disorder, unspecified: Secondary | ICD-10-CM | POA: Diagnosis not present

## 2016-02-25 DIAGNOSIS — Z96651 Presence of right artificial knee joint: Secondary | ICD-10-CM | POA: Diagnosis not present

## 2016-02-25 DIAGNOSIS — N186 End stage renal disease: Secondary | ICD-10-CM | POA: Diagnosis not present

## 2016-02-25 DIAGNOSIS — R600 Localized edema: Secondary | ICD-10-CM | POA: Diagnosis not present

## 2016-02-25 DIAGNOSIS — R4182 Altered mental status, unspecified: Secondary | ICD-10-CM | POA: Diagnosis present

## 2016-02-25 DIAGNOSIS — I252 Old myocardial infarction: Secondary | ICD-10-CM | POA: Insufficient documentation

## 2016-02-25 DIAGNOSIS — R531 Weakness: Secondary | ICD-10-CM | POA: Diagnosis not present

## 2016-02-25 DIAGNOSIS — Z79899 Other long term (current) drug therapy: Secondary | ICD-10-CM | POA: Insufficient documentation

## 2016-02-25 DIAGNOSIS — K219 Gastro-esophageal reflux disease without esophagitis: Secondary | ICD-10-CM | POA: Diagnosis not present

## 2016-02-25 DIAGNOSIS — E1143 Type 2 diabetes mellitus with diabetic autonomic (poly)neuropathy: Secondary | ICD-10-CM | POA: Diagnosis not present

## 2016-02-25 DIAGNOSIS — Z7902 Long term (current) use of antithrombotics/antiplatelets: Secondary | ICD-10-CM | POA: Diagnosis not present

## 2016-02-25 DIAGNOSIS — Z87891 Personal history of nicotine dependence: Secondary | ICD-10-CM | POA: Diagnosis not present

## 2016-02-25 DIAGNOSIS — F419 Anxiety disorder, unspecified: Secondary | ICD-10-CM | POA: Diagnosis not present

## 2016-02-25 DIAGNOSIS — Z794 Long term (current) use of insulin: Secondary | ICD-10-CM | POA: Diagnosis not present

## 2016-02-25 DIAGNOSIS — G9340 Encephalopathy, unspecified: Principal | ICD-10-CM | POA: Insufficient documentation

## 2016-02-25 LAB — URINALYSIS COMPLETE WITH MICROSCOPIC (ARMC ONLY)
BILIRUBIN URINE: NEGATIVE
Glucose, UA: 50 mg/dL — AB
HGB URINE DIPSTICK: NEGATIVE
KETONES UR: NEGATIVE mg/dL
LEUKOCYTES UA: NEGATIVE
NITRITE: NEGATIVE
PH: 5 (ref 5.0–8.0)
Protein, ur: >500 mg/dL — AB
Specific Gravity, Urine: 1.012 (ref 1.005–1.030)
Squamous Epithelial / LPF: NONE SEEN

## 2016-02-25 LAB — COMPREHENSIVE METABOLIC PANEL
ALT: 9 U/L — AB (ref 14–54)
AST: 28 U/L (ref 15–41)
Albumin: 2.5 g/dL — ABNORMAL LOW (ref 3.5–5.0)
Alkaline Phosphatase: 70 U/L (ref 38–126)
Anion gap: 10 (ref 5–15)
BILIRUBIN TOTAL: 0.7 mg/dL (ref 0.3–1.2)
BUN: 42 mg/dL — AB (ref 6–20)
CO2: 25 mmol/L (ref 22–32)
CREATININE: 5.21 mg/dL — AB (ref 0.44–1.00)
Calcium: 9.4 mg/dL (ref 8.9–10.3)
Chloride: 100 mmol/L — ABNORMAL LOW (ref 101–111)
GFR calc Af Amer: 10 mL/min — ABNORMAL LOW (ref >60)
GFR calc non Af Amer: 9 mL/min — ABNORMAL LOW (ref >60)
Glucose, Bld: 125 mg/dL — ABNORMAL HIGH (ref 65–99)
POTASSIUM: 4 mmol/L (ref 3.5–5.1)
Sodium: 135 mmol/L (ref 135–145)
TOTAL PROTEIN: 5.6 g/dL — AB (ref 6.5–8.1)

## 2016-02-25 LAB — CBC
HCT: 34.5 % — ABNORMAL LOW (ref 35.0–47.0)
Hemoglobin: 11.3 g/dL — ABNORMAL LOW (ref 12.0–16.0)
MCH: 28.3 pg (ref 26.0–34.0)
MCHC: 32.6 g/dL (ref 32.0–36.0)
MCV: 86.9 fL (ref 80.0–100.0)
PLATELETS: 327 10*3/uL (ref 150–440)
RBC: 3.98 MIL/uL (ref 3.80–5.20)
RDW: 17.1 % — AB (ref 11.5–14.5)
WBC: 8.3 10*3/uL (ref 3.6–11.0)

## 2016-02-25 LAB — MRSA PCR SCREENING: MRSA BY PCR: POSITIVE — AB

## 2016-02-25 LAB — GLUCOSE, CAPILLARY
GLUCOSE-CAPILLARY: 108 mg/dL — AB (ref 65–99)
Glucose-Capillary: 153 mg/dL — ABNORMAL HIGH (ref 65–99)

## 2016-02-25 LAB — AMMONIA: Ammonia: 29 umol/L (ref 9–35)

## 2016-02-25 MED ORDER — RANOLAZINE ER 500 MG PO TB12
500.0000 mg | ORAL_TABLET | Freq: Two times a day (BID) | ORAL | Status: DC
  Administered 2016-02-25 – 2016-02-27 (×4): 500 mg via ORAL
  Filled 2016-02-25 (×4): qty 1

## 2016-02-25 MED ORDER — HYDRALAZINE HCL 20 MG/ML IJ SOLN
10.0000 mg | Freq: Four times a day (QID) | INTRAMUSCULAR | Status: DC | PRN
  Administered 2016-02-25 – 2016-02-27 (×5): 10 mg via INTRAVENOUS
  Filled 2016-02-25 (×5): qty 1

## 2016-02-25 MED ORDER — HYDRALAZINE HCL 25 MG PO TABS
25.0000 mg | ORAL_TABLET | Freq: Every day | ORAL | Status: DC
  Administered 2016-02-26 – 2016-02-27 (×2): 25 mg via ORAL
  Filled 2016-02-25 (×2): qty 1

## 2016-02-25 MED ORDER — ISOSORBIDE MONONITRATE ER 30 MG PO TB24
90.0000 mg | ORAL_TABLET | Freq: Every day | ORAL | Status: DC
  Administered 2016-02-26 – 2016-02-27 (×2): 90 mg via ORAL
  Filled 2016-02-25 (×2): qty 3

## 2016-02-25 MED ORDER — ONDANSETRON HCL 4 MG PO TABS: 4.0000 mg | ORAL_TABLET | Freq: Four times a day (QID) | ORAL | Status: DC | PRN

## 2016-02-25 MED ORDER — TORSEMIDE 20 MG PO TABS
100.0000 mg | ORAL_TABLET | Freq: Every day | ORAL | Status: DC
  Administered 2016-02-26 – 2016-02-27 (×2): 100 mg via ORAL
  Filled 2016-02-25 (×2): qty 5

## 2016-02-25 MED ORDER — HEPARIN SODIUM (PORCINE) 5000 UNIT/ML IJ SOLN
5000.0000 [IU] | Freq: Three times a day (TID) | INTRAMUSCULAR | Status: DC
  Administered 2016-02-25 – 2016-02-27 (×5): 5000 [IU] via SUBCUTANEOUS
  Filled 2016-02-25 (×5): qty 1

## 2016-02-25 MED ORDER — RENA-VITE PO TABS
1.0000 | ORAL_TABLET | Freq: Every day | ORAL | Status: DC
  Administered 2016-02-25 – 2016-02-27 (×3): 1 via ORAL
  Filled 2016-02-25 (×3): qty 1

## 2016-02-25 MED ORDER — COLESEVELAM HCL 625 MG PO TABS
625.0000 mg | ORAL_TABLET | Freq: Two times a day (BID) | ORAL | Status: DC
Start: 2016-02-25 — End: 2016-02-27
  Administered 2016-02-25 – 2016-02-27 (×4): 625 mg via ORAL
  Filled 2016-02-25 (×5): qty 1

## 2016-02-25 MED ORDER — ONDANSETRON HCL 4 MG/2ML IJ SOLN: 4.0000 mg | Freq: Four times a day (QID) | INTRAMUSCULAR | Status: DC | PRN

## 2016-02-25 MED ORDER — SODIUM CHLORIDE 0.9 % IV SOLN: 250.0000 mL | INTRAVENOUS | Status: DC | PRN

## 2016-02-25 MED ORDER — NITROGLYCERIN 0.4 MG SL SUBL: 0.4000 mg | SUBLINGUAL_TABLET | SUBLINGUAL | Status: DC | PRN

## 2016-02-25 MED ORDER — LISINOPRIL 10 MG PO TABS
10.0000 mg | ORAL_TABLET | Freq: Every day | ORAL | Status: DC
  Administered 2016-02-26 – 2016-02-27 (×2): 10 mg via ORAL
  Filled 2016-02-25 (×2): qty 1

## 2016-02-25 MED ORDER — CHLORHEXIDINE GLUCONATE CLOTH 2 % EX PADS
6.0000 | MEDICATED_PAD | Freq: Every day | CUTANEOUS | Status: DC
  Administered 2016-02-26 – 2016-02-27 (×2): 6 via TOPICAL

## 2016-02-25 MED ORDER — CLOPIDOGREL BISULFATE 75 MG PO TABS
75.0000 mg | ORAL_TABLET | Freq: Every day | ORAL | Status: DC
  Administered 2016-02-26 – 2016-02-27 (×2): 75 mg via ORAL
  Filled 2016-02-25 (×2): qty 1

## 2016-02-25 MED ORDER — SODIUM CHLORIDE 0.9% FLUSH
3.0000 mL | Freq: Two times a day (BID) | INTRAVENOUS | Status: DC
  Administered 2016-02-25 – 2016-02-26 (×3): 3 mL via INTRAVENOUS

## 2016-02-25 MED ORDER — MUPIROCIN 2 % EX OINT
1.0000 "application " | TOPICAL_OINTMENT | Freq: Two times a day (BID) | CUTANEOUS | Status: DC
  Administered 2016-02-25 – 2016-02-27 (×4): 1 via NASAL
  Filled 2016-02-25: qty 22

## 2016-02-25 MED ORDER — ACETAMINOPHEN 325 MG PO TABS
650.0000 mg | ORAL_TABLET | Freq: Four times a day (QID) | ORAL | Status: DC | PRN
  Administered 2016-02-26: 650 mg via ORAL
  Filled 2016-02-25: qty 2

## 2016-02-25 MED ORDER — ACETAMINOPHEN 650 MG RE SUPP: 650.0000 mg | Freq: Four times a day (QID) | RECTAL | Status: DC | PRN

## 2016-02-25 MED ORDER — POLYETHYLENE GLYCOL 3350 17 G PO PACK
17.0000 g | PACK | Freq: Every day | ORAL | Status: DC
  Filled 2016-02-25 (×2): qty 1

## 2016-02-25 MED ORDER — CALCIUM CARBONATE ANTACID 500 MG PO CHEW
1.0000 | CHEWABLE_TABLET | Freq: Three times a day (TID) | ORAL | Status: DC
  Administered 2016-02-26 – 2016-02-27 (×5): 200 mg via ORAL
  Filled 2016-02-25 (×5): qty 1

## 2016-02-25 MED ORDER — PANTOPRAZOLE SODIUM 40 MG PO TBEC
40.0000 mg | DELAYED_RELEASE_TABLET | Freq: Every day | ORAL | Status: DC
  Administered 2016-02-26 – 2016-02-27 (×2): 40 mg via ORAL
  Filled 2016-02-25 (×2): qty 1

## 2016-02-25 MED ORDER — PRO-STAT SUGAR FREE PO LIQD
30.0000 mL | Freq: Three times a day (TID) | ORAL | Status: DC
  Administered 2016-02-26 (×2): 30 mL via ORAL

## 2016-02-25 MED ORDER — METOPROLOL TARTRATE 50 MG PO TABS
50.0000 mg | ORAL_TABLET | Freq: Two times a day (BID) | ORAL | Status: DC
  Administered 2016-02-25 – 2016-02-27 (×4): 50 mg via ORAL
  Filled 2016-02-25 (×4): qty 1

## 2016-02-25 MED ORDER — ALBUTEROL SULFATE (2.5 MG/3ML) 0.083% IN NEBU: 2.5000 mg | INHALATION_SOLUTION | RESPIRATORY_TRACT | Status: DC | PRN

## 2016-02-25 MED ORDER — LEVETIRACETAM 500 MG PO TABS
500.0000 mg | ORAL_TABLET | Freq: Every morning | ORAL | Status: DC
  Administered 2016-02-26 – 2016-02-27 (×2): 500 mg via ORAL
  Filled 2016-02-25 (×2): qty 1

## 2016-02-25 MED ORDER — INSULIN GLARGINE 100 UNIT/ML ~~LOC~~ SOLN
8.0000 [IU] | Freq: Every day | SUBCUTANEOUS | Status: DC
  Administered 2016-02-25 – 2016-02-26 (×2): 8 [IU] via SUBCUTANEOUS
  Filled 2016-02-25 (×3): qty 0.08

## 2016-02-25 MED ORDER — LACTULOSE 10 GM/15ML PO SOLN
10.0000 g | Freq: Every day | ORAL | Status: DC
  Administered 2016-02-26: 10 g via ORAL
  Filled 2016-02-25 (×2): qty 30

## 2016-02-25 MED ORDER — SENNA 8.6 MG PO TABS
2.0000 | ORAL_TABLET | Freq: Two times a day (BID) | ORAL | Status: DC
  Administered 2016-02-25 – 2016-02-27 (×3): 17.2 mg via ORAL
  Filled 2016-02-25 (×4): qty 2

## 2016-02-25 MED ORDER — OXYCODONE-ACETAMINOPHEN 5-325 MG PO TABS
1.0000 | ORAL_TABLET | Freq: Three times a day (TID) | ORAL | Status: DC | PRN
  Administered 2016-02-27: 1 via ORAL
  Filled 2016-02-25: qty 1

## 2016-02-25 MED ORDER — SODIUM CHLORIDE 0.9% FLUSH: 3.0000 mL | INTRAVENOUS | Status: DC | PRN

## 2016-02-25 MED ORDER — XEROFORM PETROLATUM DRESSING EX PADS: 1.0000 | MEDICATED_PAD | CUTANEOUS | Status: DC

## 2016-02-25 NOTE — ED Triage Notes (Signed)
Pt presents to ED from [facility] c/o confusion for several days

## 2016-02-25 NOTE — ED Notes (Signed)
Spoke with Dr. Imogene Burn regarding BP, "ok to give oral meds when she gets to floor; no need for IV bP meds"

## 2016-02-25 NOTE — Progress Notes (Signed)
Subjective:  Patient known to our practice from previous admissions. She dialy[facility]. [facility]. She is currently a resident of [facility]. She was sent for evaluation of confusion which has been present for several days. Patient is not able to provide detailed history. Her mental status waxes and wanes. She gave good information to care manager in the emergency room. However, during this interview, patient could not tell us that she is in [facility]. She knew her name and date of birth. She could not tell me whether she gets hemodialysis or peritoneal dialysis. She seems to be confused about her catheters.  She does not appear to be significantly short of breath. She does have some lower extremity edema. Workup so far has included urinalysis which is negative for infection. CT of the head does not show any acute abnormalities Chest x-ray is negative for any acute cardiopulmonary process  Objective:  Vital signs in last 24 hours:  Temp:  [97.7 F (36.5 C)-97.9 F (36.6 C)] 97.7 F (36.5 C) (11/26 1801) Pulse Rate:  [70-81] 81 (11/26 1801) Resp:  [12-22] 17 (11/26 1801) BP: (167-177)/(57-88) 177/88 (11/26 1801) SpO2:  [96 %-97 %] 97 % (11/26 1801) Weight:  [115.2 kg (254 lb)] 115.2 kg (254 lb) (11/26 1311)  Weight change:  Filed Weights   02/25/16 1311  Weight: 115.2 kg (254 lb)    Intake/Output:   No intake or output data in the 24 hours ending 02/25/16 1801   Physical Exam: General: No acute distress, laying in the bed   HEENT Anicteric, dry oral mucous membranes   Neck Supple   Pulm/lungs Mild basilar crackles, normal breathing effort, nasal cannula oxygen   CVS/Heart No rub or gallop   Abdomen:  Soft, nontender, PD catheter   Extremities: 1-2+ pitting edema on left, right BKA   Neurologic: Follows commands, dysphasic versus disoriented versus delirious   Skin: No acute rashes, warm, dry   Access:  Left IJ PermCath, PD catheter in place, exit site nontender,        Basic Metabolic Panel:   Recent Labs Lab 02/25/16 1328  NA 135  K 4.0  CL 100*  CO2 25  GLUCOSE 125*  BUN 42*  CREATININE 5.21*  CALCIUM 9.4     CBC:  Recent Labs Lab 02/25/16 1328  WBC 8.3  HGB 11.3*  HCT 34.5*  MCV 86.9  PLT 327      Microbiology:  No results found for this or any previous visit (from the past 720 hour(s)).  Coagulation Studies: No results for input(s): LABPROT, INR in the last 72 hours.  Urinalysis:  Recent Labs  02/25/16 1328  COLORURINE YELLOW*  LABSPEC 1.012  PHURINE 5.0  GLUCOSEU 50*  HGBUR NEGATIVE  BILIRUBINUR NEGATIVE  KETONESUR NEGATIVE  PROTEINUR >500*  NITRITE NEGATIVE  LEUKOCYTESUR NEGATIVE      Imaging: Dg Chest 1 View  Result Date: 02/25/2016 CLINICAL DATA:  50 year old female with 1 week history of confusion EXAM: CHEST 1 VIEW COMPARISON:  Prior chest x-ray 01/19/2016 FINDINGS: Left IJ approach tunneled hemodialysis catheter. Catheter tip projects over the distal SVC. Borderline cardiomegaly is similar compared to prior. Trace atherosclerotic calcifications again noted in the transverse aorta. No evidence of pulmonary edema. Interval improvement in the left lower lobe atelectasis and associated effusion compared to the prior radiograph. No focal airspace consolidation, effusion or pneumothorax. No acute osseous abnormality. IMPRESSION: 1. No acute cardiopulmonary process. 2. Interval resolution  of small left pleural effusion and associated atelectasis compared to 01/19/2016. 3. Left IJ approach tunneled hemodialysis catheter with the tip overlying the mid SVC. Electronically Signed   By: Malachy Moan M.D.   On: 02/25/2016 14:26   Ct Head Wo Contrast  Result Date: 02/25/2016 CLINICAL DATA:  Altered mental status. EXAM: CT HEAD WITHOUT CONTRAST TECHNIQUE: Contiguous axial images were obtained from the base of the skull through the vertex without  intravenous contrast. COMPARISON:  CT scan of December 30, 2015. FINDINGS: Brain: No evidence of acute infarction, hemorrhage, hydrocephalus, extra-axial collection or mass lesion/mass effect. Vascular: Atherosclerosis of carotid siphons is noted. Skull: Normal. Negative for fracture or focal lesion. Sinuses/Orbits: No acute finding. Other: None. IMPRESSION: No acute intracranial abnormality seen. Electronically Signed   By: Lupita Raider, M.D.   On: 02/25/2016 14:48     Medications:    . [START ON 02/26/2016] calcium carbonate  1 tablet Oral TID WC  . clopidogrel  75 mg Oral Daily  . colesevelam  625 mg Oral BID WC  . [START ON 02/26/2016] feeding supplement (PRO-STAT SUGAR FREE 64)  30 mL Oral TID WC  . heparin  5,000 Units Subcutaneous Q8H  . hydrALAZINE  25 mg Oral Daily  . insulin glargine  8 Units Subcutaneous QHS  . isosorbide mononitrate  90 mg Oral Daily  . lactulose  10 g Oral Daily  . [START ON 02/26/2016] levETIRAcetam  500 mg Oral q morning - 10a  . lisinopril  10 mg Oral Daily  . metoprolol  50 mg Oral BID  . multivitamin  1 tablet Oral Daily  . pantoprazole  40 mg Oral Daily  . polyethylene glycol  17 g Oral Daily  . ranolazine  500 mg Oral BID  . senna  2 tablet Oral BID  . sodium chloride flush  3 mL Intravenous Q12H  . torsemide  100 mg Oral Daily  . XEROFORM PETROLATUM DRESSING  1 each Apply externally Q72H   sodium chloride, acetaminophen **OR** acetaminophen, albuterol, hydrALAZINE, nitroGLYCERIN, ondansetron **OR** ondansetron (ZOFRAN) IV, oxyCODONE-acetaminophen, sodium chloride flush  Assessment/ Plan:  50 y.o. Caucasian female with end-stage renal disease on hemodialysis, asthma, gastroparesis, diabetes mellitus type II, congestive heart failure, hypertension, endometriosis, coronary atherosclerosis, drug-eluting stent to LAD 2017  [facility]/ [facility] [address]/ TTS   1. End-stage renal disease on hemodialysis dialysis with LE edema -  Electrolytes and volume status are acceptable. No acute indication of HD at present - We will arrange for hemodialysis in-patient TTS schedule  2. Confusion/ Altered mental status - Unclear cause. Blood glucose 125 - Differential diagnoses includes - Sepsis- workup so far has included urinalysis which is negative for infection. Chest x-ray is negative for acute process. We will obtain blood cultures. We will also obtain PD cell count and cultures. - Other differential includes side effect of psychoactive medications- Risperdal on hold  - CT head is negative for acute process   3. SHPTH - monitor Phos  4. AOCKD - Hgb 11.3 - EPO if Hgb trends lower      LOS: 0 Lennard Capek 11/26/20176:01 PM

## 2016-02-25 NOTE — Care Management Obs Status (Signed)
MEDICARE OBSERVATION STATUS NOTIFICATION   Patient Details  Name: Kimberly Montgomery MRN: [id number] Date of Birth: [birth year]   Medicare Observation Status Notification Given:  Yes (A&O x4)    Caren Macadam, RN 02/25/2016, 5:02 PM

## 2016-02-25 NOTE — ED Provider Notes (Signed)
[facility] Emergency Department Provider Note    First MD Initiated Contact with Patient 02/25/16 1310     (approximate)  I have reviewed the triage vital signs and the nursing notes.   HISTORY  Chief Complaint Altered Mental Status    HPI Kimberly Montgomery is a 50 y.o. female with multiple comorbidities presenting from nursing home with 1 week of confusion. Patient arrives stating that she is having problems with her abdominal catheter. Per nursing report there is no issues with the catheter. Patient states that the current year is 55. Is uncertain as to where she is. She denies any numbness or tingling. No chest pain. She has no other complaints. He is been no report of any fevers at home.    Past Medical History:  Diagnosis Date  . Anginal pain (HCC)   . Anxiety   . Asthma   . Bipolar disorder (HCC)   . CAD (coronary artery disease)   . CHF (congestive heart failure) (HCC)   . Chronic lower back pain   . Depression   . Endometriosis   . ESRD (end stage renal disease) on dialysis (HCC)    "[facility]; [address]; [place]; TTS" (01/19/2016)  . Gastroparesis   . GERD (gastroesophageal reflux disease)   . History of blood transfusion "several"   "my blood would get low; low RBC"  . History of hiatal hernia   . HLD (hyperlipidemia)   . Hypertension   . Migraine    "monthly" (01/19/2016)  . Myocardial infarction 2017   "~ 3 wks ago" (01/19/2016)  . Renal disorder   . Seizures (HCC) 07/2015   "I've only had the 1; don't know what from" (01/19/2016)  . Type II diabetes mellitus (HCC)    Family History  Problem Relation Age of Onset  . CAD    . Diabetes    . Bipolar disorder    . Cervical cancer Mother    Past Surgical History:  Procedure Laterality Date  . ABDOMINAL HYSTERECTOMY     "partial"  . BELOW KNEE LEG AMPUTATION Right 2010?  [name] CARDIAC CATHETERIZATION Right 07/10/2015   Procedure: Left Heart Cath and Coronary Angiography;   Surgeon: Laurier Nancy, MD;  Location: [facility] INVASIVE CV LAB;  Service: Cardiovascular;  Laterality: Right;  . CARDIAC CATHETERIZATION Right 09/14/2015   Procedure: Left Heart Cath and Coronary Angiography;  Surgeon: Laurier Nancy, MD;  Location: [facility] INVASIVE CV LAB;  Service: Cardiovascular;  Laterality: Right;  . CARDIAC CATHETERIZATION N/A 09/14/2015   Procedure: Coronary Stent Intervention;  Surgeon: Alwyn Pea, MD;  Location: [facility] INVASIVE CV LAB;  Service: Cardiovascular;  Laterality: N/A;  . CARDIAC CATHETERIZATION Right 11/20/2015   Procedure: Left Heart Cath and Coronary Angiography;  Surgeon: Laurier Nancy, MD;  Location: [facility] INVASIVE CV LAB;  Service: Cardiovascular;  Laterality: Right;  . CORONARY ANGIOPLASTY WITH STENT PLACEMENT  <2017   @ [facility]/notes 07/03/2013  . HYSTEROTOMY    . PERIPHERAL VASCULAR CATHETERIZATION N/A 08/30/2015   Procedure: Dialysis/Perma Catheter Insertion;  Surgeon: Annice Needy, MD;  Location: [facility] INVASIVE CV LAB;  Service: Cardiovascular;  Laterality: N/A;  . PERIPHERAL VASCULAR CATHETERIZATION N/A 01/01/2016   Procedure: Dialysis/Perma Catheter Insertion;  Surgeon: Annice Needy, MD;  Location: [facility] INVASIVE CV LAB;  Service: Cardiovascular;  Laterality: N/A;  . PERITONEAL CATHETER INSERTION  11/03/2013   [name] 11/03/2013  . PERITONEAL CATHETER REMOVAL  01/01/2016   "took the one from May out; put new PD  cath in" (01/19/2016)  . PERITONEAL CATHETER REMOVAL  11/03/2013; 02/11/2014   [name]/notes 11/03/2013; Removal of tunneled catheter/notes 02/11/2014  . SALPINGOOPHORECTOMY Right    [name]/notes 07/03/2013  . TUBAL LIGATION     Patient Active Problem List   Diagnosis Date Noted  . Chronic diastolic congestive heart failure (HCC) 01/22/2016  . Pressure injury of skin 01/20/2016  . Bacteremia 12/27/2015  . Essential hypertension 11/21/2015  . Hyperlipidemia 11/21/2015  . Anemia of chronic disease 11/21/2015  . Acute respiratory failure with hypoxia (HCC) 11/21/2015   . Acute diastolic CHF (congestive heart failure) (HCC) 11/21/2015  . ESRD on dialysis (HCC) 11/21/2015  . 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 NSTEMI (non-ST elevated myocardial infarction) (HCC) 11/20/2015  . Chest pain, rule out acute myocardial infarction 11/18/2015  . Bipolar I disorder, most recent episode depressed (HCC)   . Altered mental status 08/24/2015  . ESRD (end stage renal disease) (HCC)   . Ileus (HCC)   . Bipolar I disorder (HCC) 07/25/2015  . Seizures (HCC) 07/25/2015  . Peritonitis (HCC) 07/16/2015  . Unstable angina (HCC) 07/09/2015  . ESRD on peritoneal dialysis (HCC) 07/09/2015  . Accelerated hypertension 07/09/2015  . Type 2 diabetes mellitus (HCC) 07/09/2015  . CAD (coronary artery disease) 07/09/2015  . HLD (hyperlipidemia) 07/09/2015  . GERD (gastroesophageal reflux disease) 07/09/2015      Prior to Admission medications   Medication Sig Start Date End Date Taking? Authorizing Provider  Amino Acids-Protein Hydrolys (FEEDING SUPPLEMENT, PRO-STAT SUGAR FREE 64,) LIQD Take 30 mLs by mouth 3 (three) times daily with meals.    Historical Provider, MD  Bismuth Tribromoph-Petrolatum (XEROFORM PETROLATUM DRESSING) PADS Apply 1 each topically every 3 (three) days.    Historical Provider, MD  calcium carbonate (TUMS - DOSED IN MG ELEMENTAL CALCIUM) 500 MG chewable tablet Chew 1 tablet (200 mg of elemental calcium total) by mouth 3 (three) times daily with meals. 08/02/15   Adrian SaranSital Mody, MD  clopidogrel (PLAVIX) 75 MG tablet Take 75 mg by mouth daily.    Historical Provider, MD  colesevelam (WELCHOL) 625 MG tablet Take 1 tablet (625 mg total) by mouth 2 (two) times daily with a meal. 11/21/15   Katharina Caperima Vaickute, MD  hydrALAZINE (APRESOLINE) 25 MG tablet Take 25 mg by mouth daily.     Historical Provider, MD  insulin glargine (LANTUS) 100 UNIT/ML injection Inject 0.08 mLs (8 Units total) into the skin at bedtime. 08/30/15   Srikar Sudini, MD  insulin lispro (HUMALOG) 100 UNIT/ML  injection Inject 0-10 Units into the skin 3 (three) times daily before meals.    Historical Provider, MD  isosorbide mononitrate (IMDUR) 60 MG 24 hr tablet Take 1.5 tablets (90 mg total) by mouth daily. 01/20/16   Vassie Lollarlos Madera, MD  lactulose [facility](CHRONULAC) 10 GM/15ML solution Take 10 g by mouth daily.     Historical Provider, MD  levETIRAcetam (KEPPRA) 500 MG tablet Take 1 tablet (500 mg total) by mouth every morning. 08/02/15   Adrian SaranSital Mody, MD  lisinopril (PRINIVIL,ZESTRIL) 10 MG tablet Take 1 tablet (10 mg total) by mouth daily. 01/21/16   Vassie Lollarlos Madera, MD  metoCLOPramide (REGLAN) 10 MG tablet Take 1 tablet (10 mg total) by mouth 4 (four) times daily. 08/02/15   Adrian SaranSital Mody, MD  metoprolol (LOPRESSOR) 50 MG tablet Take 1 tablet (50 mg total) by mouth 2 (two) times daily. 09/15/15   Milagros LollSrikar Sudini, MD  multivitamin (RENA-VIT) TABS tablet Take 1 tablet by mouth daily.    Historical Provider, MD  mupirocin  ointment (BACTROBAN) 2 % Place 1 application into the nose 2 (two) times daily. 11/21/15   Katharina Caper, MD  nicotine (NICODERM CQ - DOSED IN MG/24 HOURS) 14 mg/24hr patch Place 14 mg onto the skin daily.    Historical Provider, MD  nitroGLYCERIN (NITRODUR - DOSED IN MG/24 HR) 0.4 mg/hr patch Place 0.4 mg onto the skin as needed.    Historical Provider, MD  nitroGLYCERIN (NITROSTAT) 0.4 MG SL tablet Place 0.4 mg under the tongue every 5 (five) minutes as needed for chest pain. Reported on 07/10/2015    Historical Provider, MD  nystatin (MYCOSTATIN) powder Apply 1 g topically 2 (two) times daily.    Historical Provider, MD  omeprazole (PRILOSEC) 20 MG capsule Take 20 mg by mouth 2 (two) times daily before a meal.     Historical Provider, MD  ondansetron (ZOFRAN ODT) 4 MG disintegrating tablet Take 1 tablet (4 mg total) by mouth every 6 (six) hours as needed for nausea or vomiting. 07/16/15   Sharyn Creamer, MD  oxyCODONE-acetaminophen (PERCOCET/ROXICET) 5-325 MG tablet Take one tablet by mouth every 6 hours as  needed for moderate pain. Do not exceed 4gm of Tylenol in 24 hours 01/22/16   Sharon Seller, NP  polyethylene glycol (MIRALAX / GLYCOLAX) packet Take 17 g by mouth daily. 08/02/15   Adrian Saran, MD  ranolazine (RANEXA) 500 MG 12 hr tablet Take 1 tablet (500 mg total) by mouth 2 (two) times daily. 07/11/15   Auburn Bilberry, MD  risperiDONE (RISPERDAL) 1 MG tablet Take 1 mg by mouth 3 (three) times daily.     Historical Provider, MD  senna (SENOKOT) 8.6 MG TABS tablet Take 2 tablets by mouth 2 (two) times daily.     Historical Provider, MD  torsemide (DEMADEX) 100 MG tablet Take 100 mg by mouth daily.    Historical Provider, MD    Allergies Cephalosporins; Penicillins; Lamictal [lamotrigine]; Phenergan [promethazine hcl]; Pravastatin; and Sulfa antibiotics    Social History Social History  Substance Use Topics  . Smoking status: Former Smoker    Packs/day: 0.75    Types: Cigarettes    Quit date: 07/01/2015  . Smokeless tobacco: Never Used  . Alcohol use 0.0 oz/week     Comment: 01/19/2016 "might have a couple drinks/year"    Review of Systems Patient denies headaches, rhinorrhea, blurry vision, numbness, shortness of breath, chest pain, edema, cough, abdominal pain, nausea, vomiting, diarrhea, dysuria, fevers, rashes or hallucinations unless otherwise stated above in HPI. ____________________________________________   PHYSICAL EXAM:  VITAL SIGNS: Vitals:   02/25/16 1318  BP: (!) 167/57  Pulse: 70  Resp: 12  Temp: 97.9 F (36.6 C)    Constitutional: Alert Chronically ill appearing in no acute distress Eyes: Conjunctivae are normal. PERRL. EOMI. Head: Atraumatic. Nose: No congestion/rhinnorhea. Mouth/Throat: Mucous membranes are moist.  Oropharynx non-erythematous. Neck: No stridor. Painless ROM. No cervical spine tenderness to palpation Hematological/Lymphatic/Immunilogical: No cervical lymphadenopathy. Cardiovascular: Normal rate, regular rhythm. Grossly normal heart  sounds.  Good peripheral circulation. Respiratory: Normal respiratory effort.  No retractions. Lungs CTAB. Gastrointestinal: Obese, soft, non-tender, right peritoneal catheter in place  Musculoskeletal: No lower extremity tenderness nor edema.  No joint effusions. Neurologic:  Normal speech and language. No gross focal neurologic deficits are appreciated.no facial droop Skin:  Skin is warm, dry and intact. No rash noted. Psychiatric: blunted affect ____________________________________________   LABS (all labs ordered are listed, but only abnormal results are displayed)  Results for orders placed or performed during the hospital  encounter of 02/25/16 (from the past 24 hour(s))  Comprehensive metabolic panel     Status: Abnormal   Collection Time: 02/25/16  1:28 PM  Result Value Ref Range   Sodium 135 135 - 145 mmol/L   Potassium 4.0 3.5 - 5.1 mmol/L   Chloride 100 (L) 101 - 111 mmol/L   CO2 25 22 - 32 mmol/L   Glucose, Bld 125 (H) 65 - 99 mg/dL   BUN 42 (H) 6 - 20 mg/dL   Creatinine, Ser 2.44 (H) 0.44 - 1.00 mg/dL   Calcium 9.4 8.9 - 01.0 mg/dL   Total Protein 5.6 (L) 6.5 - 8.1 g/dL   Albumin 2.5 (L) 3.5 - 5.0 g/dL   AST 28 15 - 41 U/L   ALT 9 (L) 14 - 54 U/L   Alkaline Phosphatase 70 38 - 126 U/L   Total Bilirubin 0.7 0.3 - 1.2 mg/dL   GFR calc non Af Amer 9 (L) >60 mL/min   GFR calc Af Amer 10 (L) >60 mL/min   Anion gap 10 5 - 15  CBC     Status: Abnormal   Collection Time: 02/25/16  1:28 PM  Result Value Ref Range   WBC 8.3 3.6 - 11.0 K/uL   RBC 3.98 3.80 - 5.20 MIL/uL   Hemoglobin 11.3 (L) 12.0 - 16.0 g/dL   HCT 27.2 (L) 53.6 - 64.4 %   MCV 86.9 80.0 - 100.0 fL   MCH 28.3 26.0 - 34.0 pg   MCHC 32.6 32.0 - 36.0 g/dL   RDW 03.4 (H) 74.2 - 59.5 %   Platelets 327 150 - 440 K/uL  Urinalysis complete, with microscopic ([facility] only)     Status: Abnormal   Collection Time: 02/25/16  1:28 PM  Result Value Ref Range   Color, Urine YELLOW (A) YELLOW   APPearance CLEAR (A) CLEAR    Glucose, UA 50 (A) NEGATIVE mg/dL   Bilirubin Urine NEGATIVE NEGATIVE   Ketones, ur NEGATIVE NEGATIVE mg/dL   Specific Gravity, Urine 1.012 1.005 - 1.030   Hgb urine dipstick NEGATIVE NEGATIVE   pH 5.0 5.0 - 8.0   Protein, ur >500 (A) NEGATIVE mg/dL   Nitrite NEGATIVE NEGATIVE   Leukocytes, UA NEGATIVE NEGATIVE   RBC / HPF 0-5 0 - 5 RBC/hpf   WBC, UA 0-5 0 - 5 WBC/hpf   Bacteria, UA RARE (A) NONE SEEN   Squamous Epithelial / LPF NONE SEEN NONE SEEN  Ammonia     Status: None   Collection Time: 02/25/16  2:49 PM  Result Value Ref Range   Ammonia 29 9 - 35 umol/L   ____________________________________________  EKG My review and personal interpretation at Time: 13:17   Indication: ams  Rate: 71  Rhythm: sinus Axis: normal Other: non specific st changes, poor r wave progression, norm ____________________________________________  RADIOLOGY  I personally reviewed all radiographic images ordered to evaluate for the above acute complaints and reviewed radiology reports and findings.  These findings were personally discussed with the patient.  Please see medical record for radiology report.  ____________________________________________   PROCEDURES  Procedure(s) performed: none Procedures    Critical Care performed: no ____________________________________________   INITIAL IMPRESSION / ASSESSMENT AND PLAN / ED COURSE  Pertinent labs & imaging results that were available during my care of the patient were reviewed by me and considered in my medical decision making (see chart for details).  DDX: Dehydration, sepsis, pna, uti, hypoglycemia, cva, drug effect, withdrawal, encephalitis   Deserea Bordley  Pew is a 50 y.o. who presents to the ED with Report of altered mental status. Patient is confused reportedly worsening over the past day or 2. She rises in no acute distress. She is afebrile and hemodynamic stable. Based on her multiple comorbidities including dialysis and complex  history with multiple similar episodes secondary infectious process will order laboratory and radiographic imaging to evaluate for acute abnormality. Her abdominal exam is soft and reassuring therefore feel less consistent with SBP.  Patient with a history of bipolar disorder but is not having any sort of hallucinations and this is seems to be more related to confusion over the past several days therefore feel less likely secondary to psychiatric illness at this time, however may be a contributing factor.  Clinical Course as of Feb 25 1627  Wynelle Link Feb 25, 2016  1507 Patient remains dynamically stable. Currently awaiting ammonia level. No evidence of infectious process.  CT head is reassuring. After speaking with nursing facility since that the confusion has been gradually worsening her past several days. No recent fevers. No change in behavior. Patient is becoming more confused. No recent medication changes.  [PR]  1525 While patient remains afebrile hemodynamic stable she is persistently encephalopathic. Still confused. Etiology uncertain. Bedside ultrasound shows no evidence of peritoneal fluid cell is consistent with SBP and lab work is less consistent with acute infectious process. Patient not with any active hallucinations and no recent medication changes to explain psychiatric possibility.  I am concerned for possible medication component leading to encephalopathy. She still persistent confused and has not returned to baseline do feel patient would benefit from admission hospital for further evaluation and management.  [PR]    Clinical Course User Index [PR] Willy Eddy, MD     ____________________________________________   FINAL CLINICAL IMPRESSION(S) / ED DIAGNOSES  Final diagnoses:  Acute encephalopathy  ESRD (end stage renal disease) on dialysis [facility])      NEW MEDICATIONS STARTED DURING THIS VISIT:  New Prescriptions   No medications on file     Note:  This document was  prepared using Dragon voice recognition software and may include unintentional dictation errors.    Willy Eddy, MD 02/25/16 [phone]

## 2016-02-25 NOTE — Progress Notes (Signed)
Patient admitted to floor; denies pain; alert and oriented X2; Dialysis Tues, Thurs, Sat per family and patient; remains on tele and O2; no distress observed; RT BKA; SCD on left leg; family at bedside; Will continue to monitor.

## 2016-02-25 NOTE — H&P (Signed)
[employer] [facility] [place] [facility]   PATIENT NAME: Kimberly Montgomery    MR#:  [id number]  DATE OF BIRTH:  October 01, 1965  DATE OF ADMISSION:  02/25/2016  PRIMARY CARE PHYSICIAN: Jarome Matin, MD   REQUESTING/REFERRING PHYSICIAN: Willy Eddy, MD  CHIEF COMPLAINT:   Chief Complaint  Patient presents with  . Altered Mental Status   Confusion one week. HISTORY OF PRESENT ILLNESS:  Kimberly Montgomery  is a 50 y.o. female with a known history of CAD, CHF, ESRD on dialysis, hypertension and hyperlipidemia. The patient was sent to ED from nursing home due to confusion one week. Patient cannot provide information. According to the ED physician Dr. Roxan Hockey, the patient is very confused but evidence of infection so far, possible due to medication.  PAST MEDICAL HISTORY:   Past Medical History:  Diagnosis Date  . Anginal pain (HCC)   . Anxiety   . Asthma   . Bipolar disorder (HCC)   . CAD (coronary artery disease)   . CHF (congestive heart failure) (HCC)   . Chronic lower back pain   . Depression   . Endometriosis   . ESRD (end stage renal disease) on dialysis (HCC)    "[facility]; [address]; [place]; TTS" (01/19/2016)  . Gastroparesis   . GERD (gastroesophageal reflux disease)   . History of blood transfusion "several"   "my blood would get low; low RBC"  . History of hiatal hernia   . HLD (hyperlipidemia)   . Hypertension   . Migraine    "monthly" (01/19/2016)  . Myocardial infarction 2017   "~ 3 wks ago" (01/19/2016)  . Renal disorder   . Seizures (HCC) 07/2015   "I've only had the 1; don't know what from" (01/19/2016)  . Type II diabetes mellitus (HCC)     PAST SURGICAL HISTORY:   Past Surgical History:  Procedure Laterality Date  . ABDOMINAL HYSTERECTOMY     "partial"  . BELOW KNEE LEG AMPUTATION Right 2010?  [name] CARDIAC CATHETERIZATION Right 07/10/2015   Procedure: Left Heart Cath and Coronary Angiography;  Surgeon: Laurier Nancy, MD;  Location:  [facility] INVASIVE CV LAB;  Service: Cardiovascular;  Laterality: Right;  . CARDIAC CATHETERIZATION Right 09/14/2015   Procedure: Left Heart Cath and Coronary Angiography;  Surgeon: Laurier Nancy, MD;  Location: [facility] INVASIVE CV LAB;  Service: Cardiovascular;  Laterality: Right;  . CARDIAC CATHETERIZATION N/A 09/14/2015   Procedure: Coronary Stent Intervention;  Surgeon: Alwyn Pea, MD;  Location: [facility] INVASIVE CV LAB;  Service: Cardiovascular;  Laterality: N/A;  . CARDIAC CATHETERIZATION Right 11/20/2015   Procedure: Left Heart Cath and Coronary Angiography;  Surgeon: Laurier Nancy, MD;  Location: [facility] INVASIVE CV LAB;  Service: Cardiovascular;  Laterality: Right;  . CORONARY ANGIOPLASTY WITH STENT PLACEMENT  <2017   @ [facility]/notes 07/03/2013  . HYSTEROTOMY    . PERIPHERAL VASCULAR CATHETERIZATION N/A 08/30/2015   Procedure: Dialysis/Perma Catheter Insertion;  Surgeon: Annice Needy, MD;  Location: [facility] INVASIVE CV LAB;  Service: Cardiovascular;  Laterality: N/A;  . PERIPHERAL VASCULAR CATHETERIZATION N/A 01/01/2016   Procedure: Dialysis/Perma Catheter Insertion;  Surgeon: Annice Needy, MD;  Location: [facility] INVASIVE CV LAB;  Service: Cardiovascular;  Laterality: N/A;  . PERITONEAL CATHETER INSERTION  11/03/2013   [name] 11/03/2013  . PERITONEAL CATHETER REMOVAL  01/01/2016   "took the one from May out; put new PD cath in" (01/19/2016)  . PERITONEAL CATHETER REMOVAL  11/03/2013; 02/11/2014   [name] 11/03/2013; Removal of tunneled catheter/notes  02/11/2014  . SALPINGOOPHORECTOMY Right    [name] 07/03/2013  . TUBAL LIGATION      SOCIAL HISTORY:   Social History  Substance Use Topics  . Smoking status: Former Smoker    Packs/day: 0.75    Types: Cigarettes    Quit date: 07/01/2015  . Smokeless tobacco: Never Used  . Alcohol use 0.0 oz/week     Comment: 01/19/2016 "might have a couple drinks/year"    FAMILY HISTORY:   Family History  Problem Relation Age of Onset  . CAD    . Diabetes    .  Bipolar disorder    . Cervical cancer Mother     DRUG ALLERGIES:   Allergies  Allergen Reactions  . Cephalosporins Anaphylaxis    Patient has tolerated meropenem.   [name] Penicillins Anaphylaxis and Other (See Comments)    Has patient had a PCN reaction causing immediate rash, facial/tongue/throat swelling, SOB or lightheadedness with hypotension: Yes Has patient had a PCN reaction causing severe rash involving mucus membranes or skin necrosis: No Has patient had a PCN reaction that required hospitalization No Has patient had a PCN reaction occurring within the last 10 years: No If all of the above answers are "NO", then may proceed with Cephalosporin use.  . Lamictal [Lamotrigine] Other (See Comments)    Reaction:  Hallucinations  . Phenergan [Promethazine Hcl] Nausea And Vomiting  . Pravastatin Other (See Comments)    Reaction:  Muscle pain   . Sulfa Antibiotics Other (See Comments)    Reaction:  Unknown     REVIEW OF SYSTEMS:   Review of Systems  Unable to perform ROS: Mental status change    MEDICATIONS AT HOME:   Prior to Admission medications   Medication Sig Start Date End Date Taking? Authorizing Provider  calcium carbonate (TUMS - DOSED IN MG ELEMENTAL CALCIUM) 500 MG chewable tablet Chew 1 tablet (200 mg of elemental calcium total) by mouth 3 (three) times daily with meals. 08/02/15  Yes Adrian Saran, MD  clopidogrel (PLAVIX) 75 MG tablet Take 75 mg by mouth daily.   Yes Historical Provider, MD  colesevelam (WELCHOL) 625 MG tablet Take 1 tablet (625 mg total) by mouth 2 (two) times daily with a meal. 11/21/15  Yes Katharina Caper, MD  hydrALAZINE (APRESOLINE) 25 MG tablet Take 25 mg by mouth daily.    Yes Historical Provider, MD  insulin glargine (LANTUS) 100 UNIT/ML injection Inject 0.08 mLs (8 Units total) into the skin at bedtime. 08/30/15  Yes Srikar Sudini, MD  isosorbide mononitrate (IMDUR) 60 MG 24 hr tablet Take 1.5 tablets (90 mg total) by mouth daily. 01/20/16  Yes  Vassie Loll, MD  lactulose [facility]) 10 GM/15ML solution Take 10 g by mouth daily.    Yes Historical Provider, MD  levETIRAcetam (KEPPRA) 500 MG tablet Take 1 tablet (500 mg total) by mouth every morning. 08/02/15  Yes Sital Mody, MD  lisinopril (PRINIVIL,ZESTRIL) 10 MG tablet Take 1 tablet (10 mg total) by mouth daily. 01/21/16  Yes Vassie Loll, MD  metoCLOPramide (REGLAN) 10 MG tablet Take 1 tablet (10 mg total) by mouth 4 (four) times daily. 08/02/15  Yes Adrian Saran, MD  metoprolol (LOPRESSOR) 50 MG tablet Take 1 tablet (50 mg total) by mouth 2 (two) times daily. 09/15/15  Yes Srikar Sudini, MD  mupirocin ointment (BACTROBAN) 2 % Place 1 application into the nose 2 (two) times daily. 11/21/15  Yes Katharina Caper, MD  nitroGLYCERIN (NITRODUR - DOSED IN MG/24 HR) 0.4 mg/hr patch  Place 0.4 mg onto the skin as needed.   Yes Historical Provider, MD  nitroGLYCERIN (NITROSTAT) 0.4 MG SL tablet Place 0.4 mg under the tongue every 5 (five) minutes as needed for chest pain. Reported on 07/10/2015   Yes Historical Provider, MD  nystatin (MYCOSTATIN) powder Apply 1 g topically 2 (two) times daily.   Yes Historical Provider, MD  omeprazole (PRILOSEC) 20 MG capsule Take 20 mg by mouth 2 (two) times daily before a meal.    Yes Historical Provider, MD  ondansetron (ZOFRAN ODT) 4 MG disintegrating tablet Take 1 tablet (4 mg total) by mouth every 6 (six) hours as needed for nausea or vomiting. 07/16/15  Yes Sharyn CreamerMark Quale, MD  oxyCODONE-acetaminophen (PERCOCET/ROXICET) 5-325 MG tablet Take one tablet by mouth every 6 hours as needed for moderate pain. Do not exceed 4gm of Tylenol in 24 hours 01/22/16  Yes Sharon SellerJessica K Eubanks, NP  polyethylene glycol (MIRALAX / GLYCOLAX) packet Take 17 g by mouth daily. 08/02/15  Yes Adrian SaranSital Mody, MD  ranolazine (RANEXA) 500 MG 12 hr tablet Take 1 tablet (500 mg total) by mouth 2 (two) times daily. 07/11/15  Yes Auburn BilberryShreyang Patel, MD  risperiDONE (RISPERDAL) 1 MG tablet Take 1 mg by mouth 3 (three)  times daily.    Yes Historical Provider, MD  senna (SENOKOT) 8.6 MG TABS tablet Take 2 tablets by mouth 2 (two) times daily.    Yes Historical Provider, MD  torsemide (DEMADEX) 100 MG tablet Take 100 mg by mouth daily.   Yes Historical Provider, MD  Amino Acids-Protein Hydrolys (FEEDING SUPPLEMENT, PRO-STAT SUGAR FREE 64,) LIQD Take 30 mLs by mouth 3 (three) times daily with meals.    Historical Provider, MD  Bismuth Tribromoph-Petrolatum (XEROFORM PETROLATUM DRESSING) PADS Apply 1 each topically every 3 (three) days.    Historical Provider, MD  multivitamin (RENA-VIT) TABS tablet Take 1 tablet by mouth daily.    Historical Provider, MD      VITAL SIGNS:  Blood pressure (!) 176/67, pulse 70, temperature 97.9 F (36.6 C), temperature source Oral, resp. rate (!) 22, height 5\' 9"  (1.753 m), weight 254 lb (115.2 kg), last menstrual period 03/16/2015, SpO2 96 %.  PHYSICAL EXAMINATION:  Physical Exam  GENERAL:  50 y.o.-year-old patient lying in the bed with no acute distress. Obese. EYES: Pupils equal, round, reactive to light and accommodation. No scleral icterus. Extraocular muscles intact.  HEENT: Head atraumatic, normocephalic. Oropharynx and nasopharynx clear.  NECK:  Supple, no jugular venous distention. No thyroid enlargement, no tenderness.  LUNGS: Normal breath sounds bilaterally, no wheezing, rales,rhonchi or crepitation. No use of accessory muscles of respiration.  CARDIOVASCULAR: S1, S2 normal. No murmurs, rubs, or gallops.  ABDOMEN: Soft, nontender, nondistended. Bowel sounds present. No organomegaly or mass.  EXTREMITIES: No pedal edema, cyanosis, or clubbing.  NEUROLOGIC: Cranial nerves II through XII are intact. Muscle strength 5/5 in all extremities. Sensation intact. Gait not checked.  PSYCHIATRIC: The patient is confused and oriented x 1.  SKIN: No obvious rash, lesion, or ulcer.   LABORATORY PANEL:   CBC  Recent Labs Lab 02/25/16 1328  WBC 8.3  HGB 11.3*  HCT 34.5*    PLT 327   ------------------------------------------------------------------------------------------------------------------  Chemistries   Recent Labs Lab 02/25/16 1328  NA 135  K 4.0  CL 100*  CO2 25  GLUCOSE 125*  BUN 42*  CREATININE 5.21*  CALCIUM 9.4  AST 28  ALT 9*  ALKPHOS 70  BILITOT 0.7   ------------------------------------------------------------------------------------------------------------------  Cardiac Enzymes No results  for input(s): TROPONINI in the last 168 hours. ------------------------------------------------------------------------------------------------------------------  RADIOLOGY:  Dg Chest 1 View  Result Date: 02/25/2016 CLINICAL DATA:  50 year old female with 1 week history of confusion EXAM: CHEST 1 VIEW COMPARISON:  Prior chest x-ray 01/19/2016 FINDINGS: Left IJ approach tunneled hemodialysis catheter. Catheter tip projects over the distal SVC. Borderline cardiomegaly is similar compared to prior. Trace atherosclerotic calcifications again noted in the transverse aorta. No evidence of pulmonary edema. Interval improvement in the left lower lobe atelectasis and associated effusion compared to the prior radiograph. No focal airspace consolidation, effusion or pneumothorax. No acute osseous abnormality. IMPRESSION: 1. No acute cardiopulmonary process. 2. Interval resolution of small left pleural effusion and associated atelectasis compared to 01/19/2016. 3. Left IJ approach tunneled hemodialysis catheter with the tip overlying the mid SVC. Electronically Signed   By: Malachy Moan M.D.   On: 02/25/2016 14:26   Ct Head Wo Contrast  Result Date: 02/25/2016 CLINICAL DATA:  Altered mental status. EXAM: CT HEAD WITHOUT CONTRAST TECHNIQUE: Contiguous axial images were obtained from the base of the skull through the vertex without intravenous contrast. COMPARISON:  CT scan of December 30, 2015. FINDINGS: Brain: No evidence of acute infarction,  hemorrhage, hydrocephalus, extra-axial collection or mass lesion/mass effect. Vascular: Atherosclerosis of carotid siphons is noted. Skull: Normal. Negative for fracture or focal lesion. Sinuses/Orbits: No acute finding. Other: None. IMPRESSION: No acute intracranial abnormality seen. Electronically Signed   By: Lupita Raider, M.D.   On: 02/25/2016 14:48      IMPRESSION AND PLAN:   Altered mental status, unclear etiology, no source of infection, possible due to medication. The patient will be placed for observation. Aspiration and fall precaution, hold Seroquel 3 times a day.  ESRD. Continue hemodialysis per Dr. Thedore Mins.  Hypertension. Continue home hypertension medications.  I discussed with Dr. Thedore Mins. All the records are reviewed and case discussed with ED provider. Management plans discussed with the patient, family and they are in agreement.  CODE STATUS: Full code  TOTAL TIME TAKING CARE OF THIS PATIENT: 52 minutes.    Shaune Pollack M.D on 02/25/2016 at 5:41 PM  Between 7am to 6pm - Pager - [phone]  After 6pm go to [email or web address] - Social research officer, government  [facility]  Office  [phone]  CC: Primary care physician; Jarome Matin, MD   Note: This dictation was prepared with Dragon dictation along with smaller phrase technology. Any transcriptional errors that result from this process are unintentional.

## 2016-02-25 NOTE — ED Notes (Signed)
Bladder scanner reveled 766 mls

## 2016-02-25 NOTE — ED Notes (Addendum)
700 ml urine drained with catheter

## 2016-02-26 DIAGNOSIS — G9340 Encephalopathy, unspecified: Secondary | ICD-10-CM | POA: Diagnosis not present

## 2016-02-26 LAB — BODY FLUID CELL COUNT WITH DIFFERENTIAL
EOS FL: 20 %
Lymphs, Fluid: 30 %
MONOCYTE-MACROPHAGE-SEROUS FLUID: 30 %
Neutrophil Count, Fluid: 19 %
Other Cells, Fluid: 1 %
Total Nucleated Cell Count, Fluid: 1132 cu mm

## 2016-02-26 LAB — GLUCOSE, CAPILLARY
GLUCOSE-CAPILLARY: 100 mg/dL — AB (ref 65–99)
Glucose-Capillary: 86 mg/dL (ref 65–99)

## 2016-02-26 MED ORDER — INSULIN ASPART 100 UNIT/ML ~~LOC~~ SOLN: 0.0000 [IU] | Freq: Every day | SUBCUTANEOUS | Status: DC

## 2016-02-26 MED ORDER — INSULIN ASPART 100 UNIT/ML ~~LOC~~ SOLN: 0.0000 [IU] | Freq: Three times a day (TID) | SUBCUTANEOUS | Status: DC

## 2016-02-26 MED ORDER — HYDRALAZINE HCL 20 MG/ML IJ SOLN: 10.0000 mg | Freq: Once | INTRAMUSCULAR | Status: DC

## 2016-02-26 NOTE — NC FL2 (Signed)
[place] MEDICAID [id number] LEVEL OF CARE SCREENING TOOL     IDENTIFICATION  Patient Name: Kimberly Montgomery Birthdate: Sep 15, 1965 Sex: female Admission Date (Current Location): 02/25/2016  [place] and [place] Number:  Chiropodist and Address:  [facility], [address] [place], [place] [address]      Provider Number: [id number]  Attending Physician Name and Address:  Adrian Saran, MD  Relative Name and Phone Number:       Current Level of Care: Hospital Recommended Level of Care: Skilled Nursing Facility Prior Approval Number:    Date Approved/Denied:   PASRR Number: [id number]  Discharge Plan: SNF    Current Diagnoses: Patient Active Problem List   Diagnosis Date Noted  . Chronic diastolic congestive heart failure (HCC) 01/22/2016  . Pressure injury of skin 01/20/2016  . Bacteremia 12/27/2015  . Essential hypertension 11/21/2015  . Hyperlipidemia 11/21/2015  . Anemia of chronic disease 11/21/2015  . Acute respiratory failure with hypoxia (HCC) 11/21/2015  . Acute diastolic CHF (congestive heart failure) (HCC) 11/21/2015  . ESRD on dialysis (HCC) 11/21/2015  . 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 NSTEMI (non-ST elevated myocardial infarction) (HCC) 11/20/2015  . Chest pain, rule out acute myocardial infarction 11/18/2015  . Bipolar I disorder, most recent episode depressed (HCC)   . Altered mental status 08/24/2015  . ESRD (end stage renal disease) (HCC)   . Ileus (HCC)   . Bipolar I disorder (HCC) 07/25/2015  . Seizures (HCC) 07/25/2015  . Peritonitis (HCC) 07/16/2015  . Unstable angina (HCC) 07/09/2015  . ESRD on peritoneal dialysis (HCC) 07/09/2015  . Accelerated hypertension 07/09/2015  . Type 2 diabetes mellitus (HCC) 07/09/2015  . CAD (coronary artery disease) 07/09/2015  . HLD (hyperlipidemia) 07/09/2015  . GERD (gastroesophageal reflux disease) 07/09/2015    Orientation RESPIRATION BLADDER Height & Weight     Self  Normal, O2  (2 liters O2) Incontinent Weight: 254 lb 4.8 oz (115.3 kg) Height:  5\' 9"  (175.3 cm)  BEHAVIORAL SYMPTOMS/MOOD NEUROLOGICAL BOWEL NUTRITION STATUS   (none)  (none) Incontinent Diet (renal carb modified)  AMBULATORY STATUS COMMUNICATION OF NEEDS Skin   Extensive Assist Verbally PU Stage and Appropriate Care                       Personal Care Assistance Level of Assistance  Dressing, Bathing Bathing Assistance: Limited assistance   Dressing Assistance: Limited assistance     Functional Limitations Info             SPECIAL CARE FACTORS FREQUENCY   (patient on outpatient dialysis)                    Contractures Contractures Info: Not present    Additional Factors Info  Code Status               Current Medications (02/26/2016):  This is the current hospital active medication list Current Facility-Administered Medications  Medication Dose Route Frequency Provider Last Rate Last Dose  . 0.9 %  sodium chloride infusion  250 mL Intravenous PRN Shaune Pollack, MD      . acetaminophen (TYLENOL) tablet 650 mg  650 mg Oral Q6H PRN Shaune Pollack, MD       Or  . acetaminophen (TYLENOL) suppository 650 mg  650 mg Rectal Q6H PRN Shaune Pollack, MD      . albuterol (PROVENTIL) (2.5 MG/3ML) 0.083% nebulizer solution 2.5 mg  2.5 mg Nebulization Q2H PRN Shaune Pollack, MD      .  calcium carbonate (TUMS - dosed in mg elemental calcium) chewable tablet 200 mg of elemental calcium  1 tablet Oral TID WC Shaune Pollack, MD   200 mg of elemental calcium at 02/26/16 1134  . Chlorhexidine Gluconate Cloth 2 % PADS 6 each  6 each Topical Q0600 Shaune Pollack, MD   6 each at 02/26/16 0200  . clopidogrel (PLAVIX) tablet 75 mg  75 mg Oral Daily Shaune Pollack, MD   75 mg at 02/26/16 0929  . colesevelam [facility]) tablet 625 mg  625 mg Oral BID WC Shaune Pollack, MD   625 mg at 02/26/16 0928  . feeding supplement (PRO-STAT SUGAR FREE 64) liquid 30 mL  30 mL Oral TID WC Shaune Pollack, MD   30 mL at 02/26/16 1321  . heparin  injection 5,000 Units  5,000 Units Subcutaneous Q8H Shaune Pollack, MD   5,000 Units at 02/26/16 0512  . hydrALAZINE (APRESOLINE) injection 10 mg  10 mg Intravenous Q6H PRN Shaune Pollack, MD   10 mg at 02/26/16 1233  . hydrALAZINE (APRESOLINE) tablet 25 mg  25 mg Oral Daily Shaune Pollack, MD   25 mg at 02/26/16 0929  . insulin aspart (novoLOG) injection 0-20 Units  0-20 Units Subcutaneous TID WC Sital Mody, MD      . insulin aspart (novoLOG) injection 0-5 Units  0-5 Units Subcutaneous QHS Sital Mody, MD      . insulin glargine (LANTUS) injection 8 Units  8 Units Subcutaneous QHS Shaune Pollack, MD   8 Units at 02/25/16 2125  . isosorbide mononitrate (IMDUR) 24 hr tablet 90 mg  90 mg Oral Daily Shaune Pollack, MD   90 mg at 02/26/16 0929  . lactulose (CHRONULAC) 10 GM/15ML solution 10 g  10 g Oral Daily Shaune Pollack, MD   10 g at 02/26/16 0921  . levETIRAcetam (KEPPRA) tablet 500 mg  500 mg Oral q morning - 10a Shaune Pollack, MD   500 mg at 02/26/16 0929  . lisinopril (PRINIVIL,ZESTRIL) tablet 10 mg  10 mg Oral Daily Shaune Pollack, MD   10 mg at 02/26/16 4540  . metoprolol (LOPRESSOR) tablet 50 mg  50 mg Oral BID Shaune Pollack, MD   50 mg at 02/26/16 0929  . multivitamin (RENA-VIT) tablet 1 tablet  1 tablet Oral Daily Shaune Pollack, MD   1 tablet at 02/26/16 0932  . mupirocin ointment (BACTROBAN) 2 % 1 application  1 application Nasal BID Shaune Pollack, MD   1 application at 02/26/16 0932  . nitroGLYCERIN (NITROSTAT) SL tablet 0.4 mg  0.4 mg Sublingual Q5 min PRN Shaune Pollack, MD      . ondansetron [facility]) tablet 4 mg  4 mg Oral Q6H PRN Shaune Pollack, MD       Or  . ondansetron [facility]) injection 4 mg  4 mg Intravenous Q6H PRN Shaune Pollack, MD      . oxyCODONE-acetaminophen (PERCOCET/ROXICET) 5-325 MG per tablet 1 tablet  1 tablet Oral Q8H PRN Shaune Pollack, MD      . pantoprazole (PROTONIX) EC tablet 40 mg  40 mg Oral Daily Shaune Pollack, MD   40 mg at 02/26/16 0929  . polyethylene glycol (MIRALAX / GLYCOLAX) packet 17 g  17 g Oral Daily Shaune Pollack, MD      .  ranolazine (RANEXA) 12 hr tablet 500 mg  500 mg Oral BID Shaune Pollack, MD   500 mg at 02/26/16 0921  . senna (SENOKOT) tablet 17.2 mg  2 tablet Oral BID Shaune Pollack, MD  17.2 mg at 02/25/16 2124  . sodium chloride flush (NS) 0.9 % injection 3 mL  3 mL Intravenous Q12H Shaune PollackQing Chen, MD   3 mL at 02/26/16 0934  . sodium chloride flush (NS) 0.9 % injection 3 mL  3 mL Intravenous PRN Shaune PollackQing Chen, MD      . torsemide [facility](DEMADEX) tablet 100 mg  100 mg Oral Daily Shaune PollackQing Chen, MD   100 mg at 02/26/16 0929  . XEROFORM PETROLATUM DRESSING PADS 1 each  1 each Apply externally Q72H Shaune PollackQing Chen, MD         Discharge Medications: Please see discharge summary for a list of discharge medications.  Relevant Imaging Results:  Relevant Lab Results:   Additional Information    York SpanielMonica Delitha Elms, LCSW

## 2016-02-26 NOTE — Progress Notes (Addendum)
[facility] - [place] at [facility]   PATIENT NAME: Kimberly ButteLaura Montgomery    MR#:  [id number]  DATE OF BIRTH:  04/26/1965    S:   less confused this am  Answers most questions   REVIEW OF SYSTEMS:    Review of Systems  Constitutional: Negative.  Negative for chills, fever and malaise/fatigue.  HENT: Negative.  Negative for ear discharge, ear pain, hearing loss, nosebleeds and sore throat.   Eyes: Negative.  Negative for blurred vision and pain.  Respiratory: Negative.  Negative for cough, hemoptysis, shortness of breath and wheezing.   Cardiovascular: Negative.  Negative for chest pain, palpitations and leg swelling.  Gastrointestinal: Negative.  Negative for abdominal pain, blood in stool, diarrhea, nausea and vomiting.  Genitourinary: Negative.  Negative for dysuria.  Musculoskeletal: Negative.  Negative for back pain.  Skin: Negative.   Neurological: Negative for dizziness, tremors, speech change, focal weakness, seizures and headaches.  Endo/Heme/Allergies: Negative.  Does not bruise/bleed easily.  Psychiatric/Behavioral: Positive for memory loss. Negative for depression, hallucinations and suicidal ideas.    Tolerating Diet:yes      DRUG ALLERGIES:   Allergies  Allergen Reactions  . Cephalosporins Anaphylaxis    Patient has tolerated meropenem.   [name]. Penicillins Anaphylaxis and Other (See Comments)    Has patient had a PCN reaction causing immediate rash, facial/tongue/throat swelling, SOB or lightheadedness with hypotension: Yes Has patient had a PCN reaction causing severe rash involving mucus membranes or skin necrosis: No Has patient had a PCN reaction that required hospitalization No Has patient had a PCN reaction occurring within the last 10 years: No If all of the above answers are "NO", then may proceed with Cephalosporin use.  . Lamictal [Lamotrigine] Other (See Comments)    Reaction:  Hallucinations  . Phenergan [Promethazine Hcl] Nausea And  Vomiting  . Pravastatin Other (See Comments)    Reaction:  Muscle pain   . Sulfa Antibiotics Other (See Comments)    Reaction:  Unknown     VITALS:  Blood pressure (!) 175/69, pulse 72, temperature 98.3 F (36.8 C), temperature source Oral, resp. rate 20, height 5\' 9"  (1.753 m), weight 115.3 kg (254 lb 4.8 oz), last menstrual period 03/16/2015, SpO2 99 %.  PHYSICAL EXAMINATION:   Physical Exam  Constitutional: She is well-developed, well-nourished, and in no distress. No distress.  HENT:  Head: Normocephalic.  Eyes: No scleral icterus.  Neck: Normal range of motion. Neck supple. No JVD present. No tracheal deviation present.  Cardiovascular: Normal rate, regular rhythm and normal heart sounds.  Exam reveals no gallop and no friction rub.   No murmur heard. Pulmonary/Chest: Effort normal and breath sounds normal. No respiratory distress. She has no wheezes. She has no rales. She exhibits no tenderness.  HD cath without discharge/redness  Abdominal: Soft. Bowel sounds are normal. She exhibits no distension and no mass. There is no tenderness. There is no rebound and no guarding.  PD cathertar  Musculoskeletal: Normal range of motion. She exhibits no edema.  Right BKA  Neurological: She is alert.  Skin: Skin is warm. No rash noted. No erythema.  Psychiatric: Affect and judgment normal.      LABORATORY PANEL:   CBC  Recent Labs Lab 02/25/16 1328  WBC 8.3  HGB 11.3*  HCT 34.5*  PLT 327   ------------------------------------------------------------------------------------------------------------------  Chemistries   Recent Labs Lab 02/25/16 1328  NA 135  K 4.0  CL 100*  CO2 25  GLUCOSE 125*  BUN 42*  CREATININE 5.21*  CALCIUM 9.4  AST 28  ALT 9*  ALKPHOS 70  BILITOT 0.7   ------------------------------------------------------------------------------------------------------------------  Cardiac Enzymes No results for input(s): TROPONINI in the last 168  hours. ------------------------------------------------------------------------------------------------------------------  RADIOLOGY:  Dg Chest 1 View  Result Date: 02/25/2016 CLINICAL DATA:  50 year old female with 1 week history of confusion EXAM: CHEST 1 VIEW COMPARISON:  Prior chest x-ray 01/19/2016 FINDINGS: Left IJ approach tunneled hemodialysis catheter. Catheter tip projects over the distal SVC. Borderline cardiomegaly is similar compared to prior. Trace atherosclerotic calcifications again noted in the transverse aorta. No evidence of pulmonary edema. Interval improvement in the left lower lobe atelectasis and associated effusion compared to the prior radiograph. No focal airspace consolidation, effusion or pneumothorax. No acute osseous abnormality. IMPRESSION: 1. No acute cardiopulmonary process. 2. Interval resolution of small left pleural effusion and associated atelectasis compared to 01/19/2016. 3. Left IJ approach tunneled hemodialysis catheter with the tip overlying the mid SVC. Electronically Signed   By: Malachy Moan M.D.   On: 02/25/2016 14:26   Ct Head Wo Contrast  Result Date: 02/25/2016 CLINICAL DATA:  Altered mental status. EXAM: CT HEAD WITHOUT CONTRAST TECHNIQUE: Contiguous axial images were obtained from the base of the skull through the vertex without intravenous contrast. COMPARISON:  CT scan of December 30, 2015. FINDINGS: Brain: No evidence of acute infarction, hemorrhage, hydrocephalus, extra-axial collection or mass lesion/mass effect. Vascular: Atherosclerosis of carotid siphons is noted. Skull: Normal. Negative for fracture or focal lesion. Sinuses/Orbits: No acute finding. Other: None. IMPRESSION: No acute intracranial abnormality seen. Electronically Signed   By: Lupita Raider, M.D.   On: 02/25/2016 14:48     ASSESSMENT AND PLAN:   50 year old female with end-stage renal disease on hemodialysis and diabetes who presents with acute encephalopathy.  1.  Acute encephalopathy of unclear etiology Patient without signs of UTI or pneumonia Mental status is improved, but not quite at baselin Continue to withhold Seroquel Follow up on blood cultures  2. End-stage renal disease on hemodialysis: Patient will undergo dialysis tomorrow  3. Essential hypertension: Continue metoprolol, isosorbide, hydralazine and lisinopril  4. Diabetes: Continue Lantus at sliding scale insulin 5. Diastolic heart failure without exacerbation and preserved ejection fraction Continue metoprolol and lisinopril Continue Ranexa  Management plans discussed with the patient and she is in agreement.  CODE STATUS: FULL  TOTAL TIME TAKING CARE OF THIS PATIENT: 33 minutes.     POSSIBLE D/C tomorrow, DEPENDING ON CLINICAL CONDITION.   Geisha Abernathy M.D on 02/26/2016 at 11:59 AM  Between 7am to 6pm - Pager - [phone] After 6pm go to [email or web address] - password [employer]  [facility]  Office  [phone]  CC: Primary care physician; Jarome Matin, MD  Note: This dictation was prepared with Dragon dictation along with smaller phrase technology. Any transcriptional errors that result from this process are unintentional.

## 2016-02-26 NOTE — Care Management (Signed)
Kimberly Montgomery Patient Patient [facility] liaison notified of admission

## 2016-02-26 NOTE — Progress Notes (Signed)
Subjective:  Appears to be less confused at the moment. She recognizes that she was in the hospital and knew that it was November. She is due for dialysis again tomorrow.   Objective:  Vital signs in last 24 hours:  Temp:  [97.5 F (36.4 C)-98.3 F (36.8 C)] 98.3 F (36.8 C) (11/27 0539) Pulse Rate:  [70-85] 72 (11/27 0539) Resp:  [12-22] 20 (11/27 0539) BP: (136-177)/(53-88) 175/69 (11/27 0539) SpO2:  [96 %-100 %] 99 % (11/27 0539) Weight:  [115.2 kg (254 lb)-115.3 kg (254 lb 4.8 oz)] 115.3 kg (254 lb 4.8 oz) (11/27 0558)  Weight change:  Filed Weights   02/25/16 1311 02/26/16 0558  Weight: 115.2 kg (254 lb) 115.3 kg (254 lb 4.8 oz)    Intake/Output:    Intake/Output Summary (Last 24 hours) at 02/26/16 1111 Last data filed at 02/25/16 1900  Gross per 24 hour  Intake              120 ml  Output                0 ml  Net              120 ml     Physical Exam: General: No acute distress, laying in the bed   HEENT Anicteric, moist oral mucous membranes   Neck Supple   Pulm/lungs Clear to auscultation bilateral, normal effort   CVS/Heart No rub or gallop S1S2  Abdomen:  Soft, nontender, PD catheter   Extremities: 1-2+ pitting edema on left, right BKA   Neurologic: Awake, oriented to person/place/time, follows commands   Skin: No acute rashes, warm, dry   Access: Left IJ PermCath, PD catheter in place       Basic Metabolic Panel:   Recent Labs Lab 02/25/16 1328  NA 135  K 4.0  CL 100*  CO2 25  GLUCOSE 125*  BUN 42*  CREATININE 5.21*  CALCIUM 9.4     CBC:  Recent Labs Lab 02/25/16 1328  WBC 8.3  HGB 11.3*  HCT 34.5*  MCV 86.9  PLT 327      Microbiology:  Recent Results (from the past 720 hour(s))  MRSA PCR Screening     Status: Abnormal   Collection Time: 02/25/16  6:40 PM  Result Value Ref Range Status   MRSA by PCR POSITIVE (A) NEGATIVE Final    Comment:        The GeneXpert MRSA Assay (FDA approved for NASAL specimens only), is  one component of a comprehensive MRSA colonization surveillance program. It is not intended to diagnose MRSA infection nor to guide or monitor treatment for MRSA infections. RESULT CALLED TO, READ BACK BY AND VERIFIED WITH: [facility] 02/25/16 @ 2118  MLK   Culture, blood (Routine X 2) w Reflex to ID Panel     Status: None (Preliminary result)   Collection Time: 02/25/16  7:26 PM  Result Value Ref Range Status   Specimen Description BLOOD RIGHT ANTECUBITAL  Final   Special Requests   Final    BOTTLES DRAWN AEROBIC AND ANAEROBIC 11 CC AEROBIC, 10 CC AEROBIC   Culture NO GROWTH < 12 HOURS  Final   Report Status PENDING  Incomplete  Culture, blood (Routine X 2) w Reflex to ID Panel     Status: None (Preliminary result)   Collection Time: 02/25/16  7:35 PM  Result Value Ref Range Status   Specimen Description BLOOD RIGHT HAND  Final   Special Requests  Final    BOTTLES DRAWN AEROBIC AND ANAEROBIC 4CC ANAEROBIC, 5CC AEROBIC   Culture NO GROWTH < 12 HOURS  Final   Report Status PENDING  Incomplete    Coagulation Studies: No results for input(s): LABPROT, INR in the last 72 hours.  Urinalysis:  Recent Labs  02/25/16 1328  COLORURINE YELLOW*  LABSPEC 1.012  PHURINE 5.0  GLUCOSEU 50*  HGBUR NEGATIVE  BILIRUBINUR NEGATIVE  KETONESUR NEGATIVE  PROTEINUR >500*  NITRITE NEGATIVE  LEUKOCYTESUR NEGATIVE      Imaging: Dg Chest 1 View  Result Date: 02/25/2016 CLINICAL DATA:  50 year old female with 1 week history of confusion EXAM: CHEST 1 VIEW COMPARISON:  Prior chest x-ray 01/19/2016 FINDINGS: Left IJ approach tunneled hemodialysis catheter. Catheter tip projects over the distal SVC. Borderline cardiomegaly is similar compared to prior. Trace atherosclerotic calcifications again noted in the transverse aorta. No evidence of pulmonary edema. Interval improvement in the left lower lobe atelectasis and associated effusion compared to the prior radiograph. No focal airspace  consolidation, effusion or pneumothorax. No acute osseous abnormality. IMPRESSION: 1. No acute cardiopulmonary process. 2. Interval resolution of small left pleural effusion and associated atelectasis compared to 01/19/2016. 3. Left IJ approach tunneled hemodialysis catheter with the tip overlying the mid SVC. Electronically Signed   By: Malachy Moan M.D.   On: 02/25/2016 14:26   Ct Head Wo Contrast  Result Date: 02/25/2016 CLINICAL DATA:  Altered mental status. EXAM: CT HEAD WITHOUT CONTRAST TECHNIQUE: Contiguous axial images were obtained from the base of the skull through the vertex without intravenous contrast. COMPARISON:  CT scan of December 30, 2015. FINDINGS: Brain: No evidence of acute infarction, hemorrhage, hydrocephalus, extra-axial collection or mass lesion/mass effect. Vascular: Atherosclerosis of carotid siphons is noted. Skull: Normal. Negative for fracture or focal lesion. Sinuses/Orbits: No acute finding. Other: None. IMPRESSION: No acute intracranial abnormality seen. Electronically Signed   By: Lupita Raider, M.D.   On: 02/25/2016 14:48     Medications:    . calcium carbonate  1 tablet Oral TID WC  . Chlorhexidine Gluconate Cloth  6 each Topical Q0600  . clopidogrel  75 mg Oral Daily  . colesevelam  625 mg Oral BID WC  . feeding supplement (PRO-STAT SUGAR FREE 64)  30 mL Oral TID WC  . heparin  5,000 Units Subcutaneous Q8H  . hydrALAZINE  25 mg Oral Daily  . insulin glargine  8 Units Subcutaneous QHS  . isosorbide mononitrate  90 mg Oral Daily  . lactulose  10 g Oral Daily  . levETIRAcetam  500 mg Oral q morning - 10a  . lisinopril  10 mg Oral Daily  . metoprolol  50 mg Oral BID  . multivitamin  1 tablet Oral Daily  . mupirocin ointment  1 application Nasal BID  . pantoprazole  40 mg Oral Daily  . polyethylene glycol  17 g Oral Daily  . ranolazine  500 mg Oral BID  . senna  2 tablet Oral BID  . sodium chloride flush  3 mL Intravenous Q12H  . torsemide  100  mg Oral Daily  . XEROFORM PETROLATUM DRESSING  1 each Apply externally Q72H   sodium chloride, acetaminophen **OR** acetaminophen, albuterol, hydrALAZINE, nitroGLYCERIN, ondansetron **OR** ondansetron (ZOFRAN) IV, oxyCODONE-acetaminophen, sodium chloride flush  Assessment/ Plan:  50 y.o. Caucasian female with end-stage renal disease on hemodialysis, asthma, gastroparesis, diabetes mellitus type II, congestive heart failure, hypertension, endometriosis, coronary atherosclerosis, drug-eluting stent to LAD 2017  [facility]phrology/ [address]/ TTS  1. End-stage renal disease on hemodialysis dialysis with LE edema - Patient due for hemodialysis again tomorrow.  As before volume status and elect or lites appear to be acceptable at the moment.  2. Confusion/ Altered mental status - Unclear cause. Blood glucose 125 - Mental status today appears to be improved. Blood cultures thus far negative. Continue to monitor clinically.  3. SHPTH - Recheck phosphorus with dialysis tomorrow.  4. AOCKD - Hemoglobin 11.3 at last check. Hold off on Epogen for now.      LOS: 0 Deklan Minar 11/27/201711:11 AM

## 2016-02-26 NOTE — Clinical Social Work Note (Signed)
Patient is a long-term care resident at [facility]. CSW attempted to contact patient's daughter: Santo Held: [phone] but there was no answer and no way to leave a message. CSW will continue to try and reach daughter. York Spaniel MSW,LCSW [phone]

## 2016-02-26 NOTE — Evaluation (Signed)
Physical Therapy Evaluation Patient Details Name: Kimberly Montgomery MRN: [id number] DOB: [birth year] Today's Date: 02/26/2016   History of Present Illness  Kimberly Montgomery is a 50 y.o. female with multiple comorbidities presenting from nursing home with 1 week of confusion. Patient arrives stating that she is having problems with her abdominal catheter. Per nursing report there is no issues with the catheter. Patient states that the current year is 38. Is uncertain as to where she is. She denies any numbness or tingling. No chest pain. She has no other complaints. He is been no report of any fevers at home. Pt is AOx2 and oriented to situation partially. Disoriented to time. Slow to answer questions and appears perplexed. History felt to be only partially reliable.  Clinical Impression  Pt admitted with above diagnosis. Pt currently with functional limitations due to the deficits listed below (see PT Problem List).  Patient is lethargic and confused during PT evaluation on this date. She requires max assistance for bed mobility and demonstrates poor ability to follow commands in sequence. Patient has a right BKA and does not have her prosthesis available. Attempted sit to stand transfers with total assist however patient unable to clear buttocks from bed. Patient is safe to return to [facility] when medically stable and would benefit from therapy services while there in order to regain strength and return to prior level of function which according patient is ambulatory with rolling walker. Pt will benefit from skilled PT services to address deficits in strength, balance, and mobility in order to return to full function at home.     Follow Up Recommendations Home health PT;Other (comment) (Return to [facility] with PT)    Equipment Recommendations  None recommended by PT    Recommendations for Other Services       Precautions / Restrictions Precautions Precautions: Fall Restrictions Weight  Bearing Restrictions: No      Mobility  Bed Mobility Overal bed mobility: Needs Assistance Bed Mobility: Supine to Sit     Supine to sit: Max assist     General bed mobility comments: Patient requires max assist to go from supine to sitting. Once upright at edge of bed patient continues to fall back to her left side stating that she is having trouble remaning upright. Pt lethargic and confused. Patient requires minA to modA to remain upright and eventually able to remain sitting with contact guard assist only. Patient with poor command follow with scooting forward to EOB. Unable to tolerate challenge to her sitting balance  Transfers Overall transfer level: Needs assistance Equipment used: Rolling walker (2 wheeled) Transfers: Sit to/from Stand Sit to Stand: Total assist;From elevated surface         General transfer comment: Attempted sit to stand transfers with total assist from therapist. Pt has R BKA but does not have her prosthesis available. Pt unable to clear buttocks from bed. Very weak and poor sequencing during attempt. Poor command follow from patient  Ambulation/Gait             General Gait Details: Unable to attempt at this time  Stairs            Wheelchair Mobility    Modified Rankin (Stroke Patients Only)       Balance Overall balance assessment: Needs assistance Sitting-balance support: No upper extremity supported Sitting balance-Leahy Scale: Poor  Pertinent Vitals/Pain Pain Assessment: Faces Faces Pain Scale: Hurts little more Pain Location: Reports headache. Unable to rate on NPRS Pain Intervention(s): Monitored during session    Home Living Family/patient expects to be discharged to:: Skilled nursing facility                 Additional Comments: Pt reports she has been at [facility] for the last month and would like to return at discharge. Pts daugther available  occasionally to help with ADLs.     Prior Function Level of Independence: Needs assistance   Gait / Transfers Assistance Needed: Pt reports she has been ambulating at SNF with rolling walker  ADL's / Homemaking Assistance Needed: Pt has been requiring assist at SNF for ADLs/IADLs. Pt reports that previously she lived at home alone and was independent with ADLs/IADLs        Hand Dominance   Dominant Hand: Right    Extremity/Trunk Assessment   Upper Extremity Assessment: Generalized weakness           Lower Extremity Assessment: Generalized weakness;RLE deficits/detail RLE Deficits / Details: Pt with R BKA       Communication   Communication: No difficulties  Cognition Arousal/Alertness: Lethargic Behavior During Therapy: Flat affect Overall Cognitive Status: No family/caregiver present to determine baseline cognitive functioning Area of Impairment: Orientation Orientation Level: Disoriented to;Time;Situation (Partially oriented to situation)             General Comments: Pt is slow to respond to questions and appears perplexed. Intermittently confused during session. Follows approximately 50-75% of simple commands    General Comments      Exercises     Assessment/Plan    PT Assessment Patient needs continued PT services  PT Problem List Decreased strength;Decreased activity tolerance;Decreased balance;Decreased cognition;Decreased mobility;Decreased safety awareness;Obesity          PT Treatment Interventions DME instruction;Gait training;Functional mobility training;Therapeutic activities;Therapeutic exercise;Balance training;Neuromuscular re-education;Cognitive remediation;Patient/family education    PT Goals (Current goals can be found in the Care Plan section)  Acute Rehab PT Goals Patient Stated Goal: "I [facility] to go back to [facility]" PT Goal Formulation: With patient Time For Goal Achievement: 03/11/16 Potential to Achieve Goals: Good     Frequency Min 2X/week   Barriers to discharge        Co-evaluation               End of Session Equipment Utilized During Treatment: Gait belt Activity Tolerance: Patient limited by lethargy;Other (comment) (Confusion) Patient left: in bed;with call bell/phone within reach;with bed alarm set Nurse Communication: Other (comment) (CSW notified of DC recommendations)    Functional Assessment Tool Used: clinical judgement Functional Limitation: Mobility: Walking and moving around Mobility: Walking and Moving Around Current Status (Z6109): At least 80 percent but less than 100 percent impaired, limited or restricted Mobility: Walking and Moving Around Goal Status [phone]): At least 40 percent but less than 60 percent impaired, limited or restricted    Time: 1420-1440 PT Time Calculation (min) (ACUTE ONLY): 20 min   Charges:   PT Evaluation $PT Eval Moderate Complexity: 1 Procedure     PT G Codes:   PT G-Codes **NOT FOR INPATIENT CLASS** Functional Assessment Tool Used: clinical judgement Functional Limitation: Mobility: Walking and moving around Mobility: Walking and Moving Around Current Status (U9811): At least 80 percent but less than 100 percent impaired, limited or restricted Mobility: Walking and Moving Around Goal Status [phone]): At least 40 percent but less than 60  percent impaired, limited or restricted   Lynnea MaizesJason D Irvin Lizama PT, DPT   Allyna Pittsley 02/26/2016, 3:47 PM

## 2016-02-27 DIAGNOSIS — G9340 Encephalopathy, unspecified: Secondary | ICD-10-CM | POA: Diagnosis not present

## 2016-02-27 LAB — GLUCOSE, CAPILLARY
GLUCOSE-CAPILLARY: 87 mg/dL (ref 65–99)
GLUCOSE-CAPILLARY: 117 mg/dL — AB (ref 65–99)

## 2016-02-27 LAB — PHOSPHORUS: Phosphorus: 4.9 mg/dL — ABNORMAL HIGH (ref 2.5–4.6)

## 2016-02-27 NOTE — Discharge Summary (Signed)
[facility] - [place] at [facility]   PATIENT NAME: Kimberly Montgomery    MR#:  [id number]  DATE OF BIRTH:  1965-08-27  DATE OF ADMISSION:  02/25/2016 ADMITTING PHYSICIAN: Shaune Pollack, MD  DATE OF DISCHARGE:02/27/2016  PRIMARY CARE PHYSICIAN: DALE, PATRICK D, MD    ADMISSION DIAGNOSIS:  ESRD (end stage renal disease) on dialysis (HCC) [N18.6, Z99.2] Acute encephalopathy [G93.40]  DISCHARGE DIAGNOSIS:  Active Problems:   Altered mental status   SECONDARY DIAGNOSIS:   Past Medical History:  Diagnosis Date  . Anginal pain (HCC)   . Anxiety   . Asthma   . Bipolar disorder (HCC)   . CAD (coronary artery disease)   . CHF (congestive heart failure) (HCC)   . Chronic lower back pain   . Depression   . Endometriosis   . ESRD (end stage renal disease) on dialysis (HCC)    "[facility]; [address]; [place]; TTS" (01/19/2016)  . Gastroparesis   . GERD (gastroesophageal reflux disease)   . History of blood transfusion "several"   "my blood would get low; low RBC"  . History of hiatal hernia   . HLD (hyperlipidemia)   . Hypertension   . Migraine    "monthly" (01/19/2016)  . Myocardial infarction 2017   "~ 3 wks ago" (01/19/2016)  . Renal disorder   . Seizures (HCC) 07/2015   "I've only had the 1; don't know what from" (01/19/2016)  . Type II diabetes mellitus [facility])     HOSPITAL COURSE:  50 year old female with end-stage renal disease on hemodialysis and diabetes who presents with acute encephalopathy.  1. Acute encephalopathy of unclear etiology Patient without signs of UTI or pneumonia Peritoneal fluid with 1132 WBC and 19 neutraphil This was concentrated and was not taken correctly, but suspicion for peritoneal infection is low. Her blood cultures were negative. Her Mental status has improved Risperdal and Reglan stopped for now.   2. End-stage renal disease on hemodialysis: Patient has dialysis on Tuesday, Thursday and Saturdays. 3. Essential  hypertension: Continue metoprolol, isosorbide, hydralazine and lisinopril  4. Diabetes: Continue Lantus and ADA/Renal diet 5. Diastolic heart failure without exacerbation and preserved ejection fraction Continue metoprolol and lisinopril Continue Ranexa    DISCHARGE CONDITIONS AND DIET:  Stable Renal diabetic diet  CONSULTS OBTAINED:  Treatment Team:  Mady Haagensen, MD  DRUG ALLERGIES:   Allergies  Allergen Reactions  . Cephalosporins Anaphylaxis    Patient has tolerated meropenem.   [name] Penicillins Anaphylaxis and Other (See Comments)    Has patient had a PCN reaction causing immediate rash, facial/tongue/throat swelling, SOB or lightheadedness with hypotension: Yes Has patient had a PCN reaction causing severe rash involving mucus membranes or skin necrosis: No Has patient had a PCN reaction that required hospitalization No Has patient had a PCN reaction occurring within the last 10 years: No If all of the above answers are "NO", then may proceed with Cephalosporin use.  . Lamictal [Lamotrigine] Other (See Comments)    Reaction:  Hallucinations  . Phenergan [Promethazine Hcl] Nausea And Vomiting  . Pravastatin Other (See Comments)    Reaction:  Muscle pain   . Sulfa Antibiotics Other (See Comments)    Reaction:  Unknown     DISCHARGE MEDICATIONS:   Current Discharge Medication List    CONTINUE these medications which have NOT CHANGED   Details  calcium carbonate (TUMS - DOSED IN MG ELEMENTAL CALCIUM) 500 MG chewable tablet Chew 1 tablet (200 mg of elemental calcium total) by mouth 3 (  three) times daily with meals. Qty: 120 tablet, Refills: 0    clopidogrel (PLAVIX) 75 MG tablet Take 75 mg by mouth daily.    colesevelam (WELCHOL) 625 MG tablet Take 1 tablet (625 mg total) by mouth 2 (two) times daily with a meal. Qty: 60 tablet, Refills: 6    hydrALAZINE (APRESOLINE) 25 MG tablet Take 25 mg by mouth daily.     insulin glargine (LANTUS) 100 UNIT/ML injection  Inject 0.08 mLs (8 Units total) into the skin at bedtime. Qty: 10 mL, Refills: 11    isosorbide mononitrate (IMDUR) 60 MG 24 hr tablet Take 1.5 tablets (90 mg total) by mouth daily. Qty: 30 tablet, Refills: 00    lactulose (CHRONULAC) 10 GM/15ML solution Take 10 g by mouth daily.     levETIRAcetam (KEPPRA) 500 MG tablet Take 1 tablet (500 mg total) by mouth every morning. Qty: 30 tablet, Refills: 0    lisinopril (PRINIVIL,ZESTRIL) 10 MG tablet Take 1 tablet (10 mg total) by mouth daily.    metoprolol (LOPRESSOR) 50 MG tablet Take 1 tablet (50 mg total) by mouth 2 (two) times daily.    mupirocin ointment (BACTROBAN) 2 % Place 1 application into the nose 2 (two) times daily. Qty: 22 g, Refills: 0    nitroGLYCERIN (NITRODUR - DOSED IN MG/24 HR) 0.4 mg/hr patch Place 0.4 mg onto the skin as needed.    nitroGLYCERIN (NITROSTAT) 0.4 MG SL tablet Place 0.4 mg under the tongue every 5 (five) minutes as needed for chest pain. Reported on 07/10/2015    nystatin (MYCOSTATIN) powder Apply 1 g topically 2 (two) times daily.    omeprazole (PRILOSEC) 20 MG capsule Take 20 mg by mouth 2 (two) times daily before a meal.     ondansetron (ZOFRAN ODT) 4 MG disintegrating tablet Take 1 tablet (4 mg total) by mouth every 6 (six) hours as needed for nausea or vomiting. Qty: 20 tablet, Refills: 0    oxyCODONE-acetaminophen (PERCOCET/ROXICET) 5-325 MG tablet Take one tablet by mouth every 6 hours as needed for moderate pain. Do not exceed 4gm of Tylenol in 24 hours Qty: 120 tablet, Refills: 0    polyethylene glycol (MIRALAX / GLYCOLAX) packet Take 17 g by mouth daily. Qty: 14 each, Refills: 0    ranolazine (RANEXA) 500 MG 12 hr tablet Take 1 tablet (500 mg total) by mouth 2 (two) times daily. Qty: 60 tablet, Refills: 0    senna (SENOKOT) 8.6 MG TABS tablet Take 2 tablets by mouth 2 (two) times daily.     torsemide (DEMADEX) 100 MG tablet Take 100 mg by mouth daily.    Amino Acids-Protein Hydrolys  (FEEDING SUPPLEMENT, PRO-STAT SUGAR FREE 64,) LIQD Take 30 mLs by mouth 3 (three) times daily with meals.    Bismuth Tribromoph-Petrolatum (XEROFORM PETROLATUM DRESSING) PADS Apply 1 each topically every 3 (three) days.    multivitamin (RENA-VIT) TABS tablet Take 1 tablet by mouth daily.      STOP taking these medications     metoCLOPramide (REGLAN) 10 MG tablet      risperiDONE (RISPERDAL) 1 MG tablet               Today   CHIEF COMPLAINT:  No issues overnight does not seem confused this am   VITAL SIGNS:  Blood pressure (!) 174/68, pulse 75, temperature 97.8 F (36.6 C), temperature source Oral, resp. rate 16, height 5\' 9"  (1.753 m), weight 115.3 kg (254 lb 4.8 oz), last menstrual period 03/16/2015, SpO2 100 %.  REVIEW OF SYSTEMS:  Review of Systems  Constitutional: Negative.  Negative for chills, fever and malaise/fatigue.  HENT: Negative.  Negative for ear discharge, ear pain, hearing loss, nosebleeds and sore throat.   Eyes: Negative.  Negative for blurred vision and pain.  Respiratory: Negative.  Negative for cough, hemoptysis, shortness of breath and wheezing.   Cardiovascular: Negative.  Negative for chest pain, palpitations and leg swelling.  Gastrointestinal: Negative.  Negative for abdominal pain, blood in stool, diarrhea, nausea and vomiting.  Genitourinary: Negative.  Negative for dysuria.  Musculoskeletal: Negative.  Negative for back pain.  Skin: Negative.   Neurological: Negative for dizziness, tremors, speech change, focal weakness, seizures and headaches.  Endo/Heme/Allergies: Negative.  Does not bruise/bleed easily.  Psychiatric/Behavioral: Positive for memory loss. Negative for depression, hallucinations and suicidal ideas.     PHYSICAL EXAMINATION:  GENERAL:  50 y.o.-year-old patient lying in the bed with no acute distress.  NECK:  Supple, no jugular venous distention. No thyroid enlargement, no tenderness.  LUNGS: Normal breath sounds  bilaterally, no wheezing, rales,rhonchi  No use of accessory muscles of respiration.  CARDIOVASCULAR: S1, S2 normal. No murmurs, rubs, or gallops.  ABDOMEN: Soft, non-tender, non-distended. Bowel sounds present. hard to appreciateorganomegaly or mass due to body habitus.  EXTREMITIES: No pedal edema, cyanosis, or clubbing. Right AKA PSYCHIATRIC: The patient is alert and oriented x 3.  SKIN: No obvious rash, lesion, or ulcer.   DATA REVIEW:   CBC  Recent Labs Lab 02/25/16 1328  WBC 8.3  HGB 11.3*  HCT 34.5*  PLT 327    Chemistries   Recent Labs Lab 02/25/16 1328  NA 135  K 4.0  CL 100*  CO2 25  GLUCOSE 125*  BUN 42*  CREATININE 5.21*  CALCIUM 9.4  AST 28  ALT 9*  ALKPHOS 70  BILITOT 0.7    Cardiac Enzymes No results for input(s): TROPONINI in the last 168 hours.  Microbiology Results  @MICRORSLT48 @  RADIOLOGY:  Dg Chest 1 View  Result Date: 02/25/2016 CLINICAL DATA:  50 year old female with 1 week history of confusion EXAM: CHEST 1 VIEW COMPARISON:  Prior chest x-ray 01/19/2016 FINDINGS: Left IJ approach tunneled hemodialysis catheter. Catheter tip projects over the distal SVC. Borderline cardiomegaly is similar compared to prior. Trace atherosclerotic calcifications again noted in the transverse aorta. No evidence of pulmonary edema. Interval improvement in the left lower lobe atelectasis and associated effusion compared to the prior radiograph. No focal airspace consolidation, effusion or pneumothorax. No acute osseous abnormality. IMPRESSION: 1. No acute cardiopulmonary process. 2. Interval resolution of small left pleural effusion and associated atelectasis compared to 01/19/2016. 3. Left IJ approach tunneled hemodialysis catheter with the tip overlying the mid SVC. Electronically Signed   By: Malachy MoanHeath  McCullough M.D.   On: 02/25/2016 14:26   Ct Head Wo Contrast  Result Date: 02/25/2016 CLINICAL DATA:  Altered mental status. EXAM: CT HEAD WITHOUT CONTRAST  TECHNIQUE: Contiguous axial images were obtained from the base of the skull through the vertex without intravenous contrast. COMPARISON:  CT scan of December 30, 2015. FINDINGS: Brain: No evidence of acute infarction, hemorrhage, hydrocephalus, extra-axial collection or mass lesion/mass effect. Vascular: Atherosclerosis of carotid siphons is noted. Skull: Normal. Negative for fracture or focal lesion. Sinuses/Orbits: No acute finding. Other: None. IMPRESSION: No acute intracranial abnormality seen. Electronically Signed   By: Lupita RaiderJames  Green Jr, M.D.   On: 02/25/2016 14:48      Management plans discussed with the patient and she is in agreement. Stable for  discharge AL with HHC  Patient should follow up with pcp  CODE STATUS:     Code Status Orders        Start     Ordered   02/25/16 1759  Full code  Continuous     02/25/16 1758    Code Status History    Date Active Date Inactive Code Status Order ID Comments User Context   01/19/2016  4:54 AM 01/20/2016  9:03 PM Full Code [id number]  Hillary Bow, DO ED   12/25/2015  8:01 PM 01/04/2016  8:24 PM Full Code [id number]  Houston Siren, MD Inpatient   11/19/2015  1:17 AM 11/21/2015  7:56 PM Full Code [id number]  Tonye Royalty, DO Inpatient   10/06/2015  2:08 AM 10/06/2015  6:32 PM Full Code [id number]  Ihor Austin, MD Inpatient   09/13/2015  2:14 AM 09/15/2015  5:28 PM Full Code [id number]  Arnaldo Natal, MD Inpatient   08/24/2015  3:50 AM 08/31/2015  6:47 PM Full Code [id number]  Ihor Austin, MD Inpatient   07/16/2015  6:26 AM 08/02/2015  6:48 PM Full Code [id number]  Ihor Austin, MD ED   07/10/2015  1:49 AM 07/12/2015  1:01 PM Full Code [id number]  Oralia Manis, MD Inpatient   05/12/2015  3:46 PM 05/16/2015  5:35 PM Full Code [id number]  Auburn Bilberry, MD ED      TOTAL TIME TAKING CARE OF THIS PATIENT: 38 minutes.    Note: This dictation was prepared with Dragon dictation along with smaller phrase technology. Any transcriptional errors that  result from this process are unintentional.  Jaleena Viviani M.D on 02/27/2016 at 9:12 AM  Between 7am to 6pm - Pager - [phone] After 6pm go to [email or web address] - password [email or web address]  [facility]  Office  [phone]  CC: Primary care physician; Jarome Matin, MD

## 2016-02-27 NOTE — Progress Notes (Signed)
Subjective:  Late entry. Patient was seen and evaluated during dialysis. She tolerated the treatment well. Her mental status was improved. The peritoneal fluid that was drawn previously was drawn incorrectly. Free fluid from the peritoneal cavity was sent for analysis without first instilling peritoneal dialysis fluid. Fluid was resent today.   Objective:  Vital signs in last 24 hours:  Temp:  [97.8 F (36.6 C)-98.3 F (36.8 C)] 97.8 F (36.6 C) (11/28 1447) Pulse Rate:  [67-81] 75 (11/28 1447) Resp:  [13-21] 18 (11/28 1401) BP: (91-184)/(55-90) 156/81 (11/28 1500) SpO2:  [93 %-100 %] 100 % (11/28 1447) Weight:  [115.1 kg (253 lb 12 oz)] 115.1 kg (253 lb 12 oz) (11/28 1018)  Weight change:  Filed Weights   02/25/16 1311 02/26/16 0558 02/27/16 1018  Weight: 115.2 kg (254 lb) 115.3 kg (254 lb 4.8 oz) 115.1 kg (253 lb 12 oz)    Intake/Output:    Intake/Output Summary (Last 24 hours) at 02/27/16 1530 Last data filed at 02/27/16 1355  Gross per 24 hour  Intake                0 ml  Output             2222 ml  Net            -2222 ml     Physical Exam: General: No acute distress, laying in the bed   HEENT Anicteric, moist oral mucous membranes   Neck Supple   Pulm/lungs Clear to auscultation bilateral, normal effort   CVS/Heart No rub or gallop S1S2  Abdomen:  Soft, no tenderness elicited, PD catheter in place  Extremities: 1+ pitting edema on left, right BKA   Neurologic: Awake, oriented to person/place/time, follows commands   Skin: No acute rashes, warm, dry   Access: Left IJ PermCath, PD catheter in place       Basic Metabolic Panel:   Recent Labs Lab 02/25/16 1328 02/27/16 1025  NA 135  --   K 4.0  --   CL 100*  --   CO2 25  --   GLUCOSE 125*  --   BUN 42*  --   CREATININE 5.21*  --   CALCIUM 9.4  --   PHOS  --  4.9*     CBC:  Recent Labs Lab 02/25/16 1328  WBC 8.3  HGB 11.3*  HCT 34.5*  MCV 86.9  PLT 327       Microbiology:  Recent Results (from the past 720 hour(s))  MRSA PCR Screening     Status: Abnormal   Collection Time: 02/25/16  6:40 PM  Result Value Ref Range Status   MRSA by PCR POSITIVE (A) NEGATIVE Final    Comment:        The GeneXpert MRSA Assay (FDA approved for NASAL specimens only), is one component of a comprehensive MRSA colonization surveillance program. It is not intended to diagnose MRSA infection nor to guide or monitor treatment for MRSA infections. RESULT CALLED TO, READ BACK BY AND VERIFIED WITH: MARSHA [facility] 02/25/16 @ 2118  MLK   Culture, blood (Routine X 2) w Reflex to ID Panel     Status: None (Preliminary result)   Collection Time: 02/25/16  7:26 PM  Result Value Ref Range Status   Specimen Description BLOOD RIGHT ANTECUBITAL  Final   Special Requests   Final    BOTTLES DRAWN AEROBIC AND ANAEROBIC 11 CC AEROBIC, 10 CC AEROBIC   Culture NO GROWTH 2 DAYS  Final   Report Status PENDING  Incomplete  Culture, blood (Routine X 2) w Reflex to ID Panel     Status: None (Preliminary result)   Collection Time: 02/25/16  7:35 PM  Result Value Ref Range Status   Specimen Description BLOOD RIGHT HAND  Final   Special Requests   Final    BOTTLES DRAWN AEROBIC AND ANAEROBIC 4CC ANAEROBIC, 5CC AEROBIC   Culture NO GROWTH 2 DAYS  Final   Report Status PENDING  Incomplete  Body fluid culture     Status: None (Preliminary result)   Collection Time: 02/26/16  9:54 AM  Result Value Ref Range Status   Specimen Description PERITONEAL  Final   Special Requests NONE  Final   Gram Stain   Final    MODERATE WBC PRESENT,BOTH PMN AND MONONUCLEAR NO ORGANISMS SEEN    Culture   Final    NO GROWTH < 24 HOURS Performed at [facility]    Report Status PENDING  Incomplete    Coagulation Studies: No results for input(s): LABPROT, INR in the last 72 hours.  Urinalysis:  Recent Labs  02/25/16 1328  COLORURINE YELLOW*  LABSPEC 1.012  PHURINE 5.0   GLUCOSEU 50*  HGBUR NEGATIVE  BILIRUBINUR NEGATIVE  KETONESUR NEGATIVE  PROTEINUR >500*  NITRITE NEGATIVE  LEUKOCYTESUR NEGATIVE      Imaging: No results found.   Medications:    . calcium carbonate  1 tablet Oral TID WC  . Chlorhexidine Gluconate Cloth  6 each Topical Q0600  . clopidogrel  75 mg Oral Daily  . colesevelam  625 mg Oral BID WC  . feeding supplement (PRO-STAT SUGAR FREE 64)  30 mL Oral TID WC  . heparin  5,000 Units Subcutaneous Q8H  . hydrALAZINE  10 mg Intravenous Once  . hydrALAZINE  25 mg Oral Daily  . insulin aspart  0-20 Units Subcutaneous TID WC  . insulin aspart  0-5 Units Subcutaneous QHS  . insulin glargine  8 Units Subcutaneous QHS  . isosorbide mononitrate  90 mg Oral Daily  . lactulose  10 g Oral Daily  . levETIRAcetam  500 mg Oral q morning - 10a  . lisinopril  10 mg Oral Daily  . metoprolol  50 mg Oral BID  . multivitamin  1 tablet Oral Daily  . mupirocin ointment  1 application Nasal BID  . pantoprazole  40 mg Oral Daily  . polyethylene glycol  17 g Oral Daily  . ranolazine  500 mg Oral BID  . senna  2 tablet Oral BID  . sodium chloride flush  3 mL Intravenous Q12H  . torsemide  100 mg Oral Daily   sodium chloride, acetaminophen **OR** acetaminophen, albuterol, hydrALAZINE, nitroGLYCERIN, ondansetron **OR** ondansetron (ZOFRAN) IV, oxyCODONE-acetaminophen, sodium chloride flush  Assessment/ Plan:  50 y.o. Caucasian female with end-stage renal disease on hemodialysis, asthma, gastroparesis, diabetes mellitus type II, congestive heart failure, hypertension, endometriosis, coronary atherosclerosis, drug-eluting stent to LAD 2017  [facility]/ [address]/ TTS   1. End-stage renal disease on hemodialysis dialysis with LE edema - patient completed hemodialysis today and tolerated well.  She will resume her normal outpatient dialysis schedule on Thursday.  2. Confusion/ Altered mental status - Unclear cause. Blood glucose  125 - initial peritoneal dialysis fluid sent was free fluid from the abdomen which showed a WBC count of 1132.  This fluid was drawn inappropriately.  Dialysis nurse was instructed to resend fluid after instilling peritoneal dialysis fluid.  Clinically there is  no sign of peritonitis however.  Patient appears to be back at her baseline mental status now.  3. SHPTH - phosphorus 4.9 and acceptable.  Continue to monitor as an outpatient.  4. AOCKD - anemia management as per outpatient protocol.  Most recent hemoglobin was 11.3 at target.      LOS: 0 Sharniece Gibbon 11/28/20173:30 PM

## 2016-02-27 NOTE — Clinical Social Work Note (Signed)
Clinical Social Work Assessment  Patient Details  Name: Kimberly Montgomery MRN: [id number] Date of Birth: 1965/09/17  Date of referral:  02/27/16               Reason for consult:  Facility Placement                Permission sought to share information with:    Permission granted to share information::     Name::        Agency::     Relationship::     Contact Information:     Housing/Transportation Living arrangements for the past 2 months:  Skilled Building surveyor of Information:  Facility Patient Interpreter Needed:  None Criminal Activity/Legal Involvement Pertinent to Current Situation/Hospitalization:  No - Comment as needed Significant Relationships:  Adult Children Lives with:  Facility Resident Do you feel safe going back to the place where you live?    Need for family participation in patient care:     Care giving concerns:  Patient is a long term resident at [facility].   Social Worker assessment / plan:  Physician informed CSW that patient was ready for discharge today after dialysis. Patient is alert and oriented X4 as reported by her nurse, Basil Dess, today. [name] stated she would call patient's daughter to notify of discharge. Discharge information sent to [facility] at [facility]. [name] aware that patient will not get to their facility until later this evening as she had to go to dialysis first. Nursing attempted to call report at the number given by [facility] and then at the main number to [facility] but no one would come to the phone. CSW has left [name] at message informing her of this. Nursing will try again to give report. Patient to transport via EMS when time back to [facility]. CSW established that patient will return under medicaid.  Employment status:  Disabled (Comment on whether or not currently receiving Disability) Insurance information:  Managed Medicare PT Recommendations:    Information / Referral to community resources:     Patient/Family's  Response to care:  Patient in agreement to return to [facility].  Patient/Family's Understanding of and Emotional Response to Diagnosis, Current Treatment, and Prognosis:  Patient has expressed no concerns regarding her current medical condition.   Emotional Assessment Appearance:  Appears stated age Attitude/Demeanor/Rapport:  Unable to Assess Affect (typically observed):  Unable to Assess Orientation:    Alcohol / Substance use:  Not Applicable Psych involvement (Current and /or in the community):  No (Comment)  Discharge Needs  Concerns to be addressed:  Care Coordination Readmission within the last 30 days:  No Current discharge risk:  None Barriers to Discharge:  No Barriers Identified   York Spaniel, LCSW 02/27/2016, 4:13 PM

## 2016-02-27 NOTE — Progress Notes (Signed)
Post hd vitals 

## 2016-02-27 NOTE — Progress Notes (Signed)
Pre hd info 

## 2016-02-27 NOTE — Progress Notes (Signed)
EMS arrived to take patient to [facility].  Tolerated transport well.  Kimberly Montgomery 02/27/2016  8:07 PM

## 2016-02-27 NOTE — Progress Notes (Signed)
Primary rn notified MD about pt elevated bp, order received ok to give prn medication hydralazine. Pt resting in bed continue to assess.

## 2016-02-27 NOTE — Progress Notes (Signed)
Post hd assessment 

## 2016-02-27 NOTE — Progress Notes (Signed)
Patient report called to facility. Patient to be discharged to [facility] room 1006B. IV site removed. EMS notified.

## 2016-02-27 NOTE — Progress Notes (Signed)
Pre hd assessment  

## 2016-02-27 NOTE — Progress Notes (Signed)
Patient discharged back to care facility. Patient daughter made aware.

## 2016-02-27 NOTE — Progress Notes (Signed)
End of hd tx  

## 2016-02-27 NOTE — Progress Notes (Signed)
Hd start 

## 2016-02-29 ENCOUNTER — Non-Acute Institutional Stay (SKILLED_NURSING_FACILITY): Payer: Medicare HMO | Admitting: Internal Medicine

## 2016-02-29 ENCOUNTER — Encounter: Payer: Self-pay | Admitting: Internal Medicine

## 2016-02-29 DIAGNOSIS — D638 Anemia in other chronic diseases classified elsewhere: Secondary | ICD-10-CM

## 2016-02-29 DIAGNOSIS — F319 Bipolar disorder, unspecified: Secondary | ICD-10-CM

## 2016-02-29 DIAGNOSIS — E1122 Type 2 diabetes mellitus with diabetic chronic kidney disease: Secondary | ICD-10-CM | POA: Diagnosis not present

## 2016-02-29 DIAGNOSIS — M545 Low back pain: Secondary | ICD-10-CM

## 2016-02-29 DIAGNOSIS — R5381 Other malaise: Secondary | ICD-10-CM

## 2016-02-29 DIAGNOSIS — I5032 Chronic diastolic (congestive) heart failure: Secondary | ICD-10-CM

## 2016-02-29 DIAGNOSIS — I739 Peripheral vascular disease, unspecified: Secondary | ICD-10-CM | POA: Diagnosis not present

## 2016-02-29 DIAGNOSIS — N186 End stage renal disease: Secondary | ICD-10-CM | POA: Diagnosis not present

## 2016-02-29 DIAGNOSIS — K5909 Other constipation: Secondary | ICD-10-CM | POA: Diagnosis not present

## 2016-02-29 DIAGNOSIS — G8929 Other chronic pain: Secondary | ICD-10-CM

## 2016-02-29 DIAGNOSIS — I25119 Atherosclerotic heart disease of native coronary artery with unspecified angina pectoris: Secondary | ICD-10-CM

## 2016-02-29 DIAGNOSIS — Z794 Long term (current) use of insulin: Secondary | ICD-10-CM

## 2016-02-29 DIAGNOSIS — L89152 Pressure ulcer of sacral region, stage 2: Secondary | ICD-10-CM | POA: Diagnosis not present

## 2016-02-29 DIAGNOSIS — K219 Gastro-esophageal reflux disease without esophagitis: Secondary | ICD-10-CM

## 2016-02-29 DIAGNOSIS — Z992 Dependence on renal dialysis: Secondary | ICD-10-CM

## 2016-02-29 LAB — BODY FLUID CULTURE: Culture: NO GROWTH

## 2016-02-29 LAB — PATHOLOGIST SMEAR REVIEW

## 2016-02-29 NOTE — Progress Notes (Signed)
LOCATION: [facility]  PCP: Jarome Matin, MD   Code Status: Full Code    Goals of care: Advanced Directive information Advanced Directives 02/25/2016  Does Patient Have a Medical Advance Directive? No  Would patient like information on creating a medical advance directive? No - Patient declined       Extended Emergency Contact Information Primary Emergency Contact: Dixon,Heather N Address: [address]          [place], [place] [address] [place] Phone: [phone] Relation: Daughter   Allergies  Allergen Reactions  . Cephalosporins Anaphylaxis    Patient has tolerated meropenem.   [name] Penicillins Anaphylaxis and Other (See Comments)    Has patient had a PCN reaction causing immediate rash, facial/tongue/throat swelling, SOB or lightheadedness with hypotension: Yes Has patient had a PCN reaction causing severe rash involving mucus membranes or skin necrosis: No Has patient had a PCN reaction that required hospitalization No Has patient had a PCN reaction occurring within the last 10 years: No If all of the above answers are "NO", then may proceed with Cephalosporin use.  . Lamictal [Lamotrigine] Other (See Comments)    Reaction:  Hallucinations  . Phenergan [Promethazine Hcl] Nausea And Vomiting  . Pravastatin Other (See Comments)    Reaction:  Muscle pain   . Sulfa Antibiotics Other (See Comments)    Reaction:  Unknown     Chief Complaint  Patient presents with  . Readmit To SNF    Readmission Visit     HPI:  Patient is a 50 y.o. female seen today for long term care post hospital admission from 02/25/16-02/27/16 with acute encephalopathy. Infectious etiology was ruled out. Her risperdal and reglan were stopped. Peritoneal fluid was sent for study to rule out infection but fluid was drawn inappropriately, no clinical signs of peritonitis. She is a long term care resident here and has medical history of gastroparesis, dm type 2, CHF,  HTN, CAD, ESRD on HD among others. She is seen in her room. She had dialysis today.   Review of Systems:  Constitutional: Negative for fever, chills. She feels tired.   HENT: Negative for headache, congestion, nasal discharge Eyes: Negative for blurred vision, double vision and discharge.  Respiratory: Negative for cough, shortness of breath and wheezing.   Cardiovascular: Negative for chest pain, palpitations, leg swelling.  Gastrointestinal: Negative for heartburn, nausea, vomiting, abdominal pain. Had bowel movement 2 days back.  Genitourinary: Negative for dysuria.  Musculoskeletal: Negative for back pain, fall.  Skin: Negative for itching, rash.  Neurological: Negative for dizziness. Psychiatric/Behavioral: Negative for depression   Past Medical History:  Diagnosis Date  . Anginal pain (HCC)   . Anxiety   . Asthma   . Bipolar disorder (HCC)   . CAD (coronary artery disease)   . CHF (congestive heart failure) (HCC)   . Chronic lower back pain   . Depression   . Endometriosis   . ESRD (end stage renal disease) on dialysis (HCC)    "[facility]; [address]; [place]; TTS" (01/19/2016)  . Gastroparesis   . GERD (gastroesophageal reflux disease)   . History of blood transfusion "several"   "my blood would get low; low RBC"  . History of hiatal hernia   . HLD (hyperlipidemia)   . Hypertension   . Migraine    "monthly" (01/19/2016)  . Myocardial infarction 2017   "~ 3 wks ago" (01/19/2016)  . Renal disorder   . Seizures (HCC) 07/2015   "  I've only had the 1; don't know what from" (01/19/2016)  . Type II diabetes mellitus (HCC)    Past Surgical History:  Procedure Laterality Date  . ABDOMINAL HYSTERECTOMY     "partial"  . BELOW KNEE LEG AMPUTATION Right 2010?  [name] CARDIAC CATHETERIZATION Right 07/10/2015   Procedure: Left Heart Cath and Coronary Angiography;  Surgeon: Laurier Nancy, MD;  Location: [facility];  Service: Cardiovascular;  Laterality: Right;  .  CARDIAC CATHETERIZATION Right 09/14/2015   Procedure: Left Heart Cath and Coronary Angiography;  Surgeon: Laurier Nancy, MD;  Location: [facility];  Service: Cardiovascular;  Laterality: Right;  . CARDIAC CATHETERIZATION N/A 09/14/2015   Procedure: Coronary Stent Intervention;  Surgeon: Alwyn Pea, MD;  Location: [facility];  Service: Cardiovascular;  Laterality: N/A;  . CARDIAC CATHETERIZATION Right 11/20/2015   Procedure: Left Heart Cath and Coronary Angiography;  Surgeon: Laurier Nancy, MD;  Location: [facility];  Service: Cardiovascular;  Laterality: Right;  . CORONARY ANGIOPLASTY WITH STENT PLACEMENT  <2017   @ [facility]/notes 07/03/2013  . HYSTEROTOMY    . PERIPHERAL VASCULAR CATHETERIZATION N/A 08/30/2015   Procedure: Dialysis/Perma Catheter Insertion;  Surgeon: Annice Needy, MD;  Location: [facility];  Service: Cardiovascular;  Laterality: N/A;  . PERIPHERAL VASCULAR CATHETERIZATION N/A 01/01/2016   Procedure: Dialysis/Perma Catheter Insertion;  Surgeon: Annice Needy, MD;  Location: [facility];  Service: Cardiovascular;  Laterality: N/A;  . PERITONEAL CATHETER INSERTION  11/03/2013   [name] 11/03/2013  . PERITONEAL CATHETER REMOVAL  01/01/2016   "took the one from May out; put new PD cath in" (01/19/2016)  . PERITONEAL CATHETER REMOVAL  11/03/2013; 02/11/2014   [name] 11/03/2013; Removal of tunneled catheter/notes 02/11/2014  . SALPINGOOPHORECTOMY Right    [name] 07/03/2013  . TUBAL LIGATION     Social History:   reports that she quit smoking about 7 months ago. Her smoking use included Cigarettes. She smoked 0.75 packs per day. She has never used smokeless tobacco. She reports that she drinks alcohol. She reports that she does not use drugs.  Family History  Problem Relation Age of Onset  . CAD    . Diabetes    . Bipolar disorder    . Cervical cancer Mother     Medications:   Medication List       Accurate as of 02/29/16  3:30 PM.  Always use your most recent med list.          calcium carbonate 500 MG chewable tablet Commonly known as:  TUMS - dosed in mg elemental calcium Chew 1 tablet (200 mg of elemental calcium total) by mouth 3 (three) times daily with meals.   clopidogrel 75 MG tablet Commonly known as:  PLAVIX Take 75 mg by mouth daily.   colesevelam 625 MG tablet Commonly known as:  WELCHOL Take 1 tablet (625 mg total) by mouth 2 (two) times daily with a meal.   feeding supplement (PRO-STAT SUGAR FREE 64) Liqd Take 30 mLs by mouth 3 (three) times daily with meals.   hydrALAZINE 25 MG tablet Commonly known as:  APRESOLINE Take 25 mg by mouth daily.   insulin glargine 100 UNIT/ML injection Commonly known as:  LANTUS Inject 0.08 mLs (8 Units total) into the skin at bedtime.   isosorbide mononitrate 60 MG 24 hr tablet Commonly known as:  IMDUR Take 1.5 tablets (90 mg total) by mouth daily.   lactulose 10 GM/15ML solution  Commonly known as:  CHRONULAC Take 10 g by mouth daily.   levETIRAcetam 500 MG tablet Commonly known as:  KEPPRA Take 1 tablet (500 mg total) by mouth every morning.   lisinopril 10 MG tablet Commonly known as:  PRINIVIL,ZESTRIL Take 1 tablet (10 mg total) by mouth daily.   metoprolol 50 MG tablet Commonly known as:  LOPRESSOR Take 1 tablet (50 mg total) by mouth 2 (two) times daily.   multivitamin Tabs tablet Take 1 tablet by mouth daily.   mupirocin ointment 2 % Commonly known as:  BACTROBAN Place 1 application into the nose 2 (two) times daily.   nitroGLYCERIN 0.4 MG SL tablet Commonly known as:  NITROSTAT Place 0.4 mg under the tongue every 5 (five) minutes as needed for chest pain. Reported on 07/10/2015   omeprazole 20 MG capsule Commonly known as:  PRILOSEC Take 20 mg by mouth 2 (two) times daily before a meal.   ondansetron 4 MG disintegrating tablet Commonly known as:  ZOFRAN ODT Take 1 tablet (4 mg total) by mouth every 6 (six) hours as needed for  nausea or vomiting.   oxyCODONE-acetaminophen 5-325 MG tablet Commonly known as:  PERCOCET/ROXICET Take one tablet by mouth every 6 hours as needed for moderate pain. Do not exceed 4gm of Tylenol in 24 hours   polyethylene glycol packet Commonly known as:  MIRALAX / GLYCOLAX Take 17 g by mouth daily.   ranolazine 500 MG 12 hr tablet Commonly known as:  RANEXA Take 1 tablet (500 mg total) by mouth 2 (two) times daily.   senna 8.6 MG Tabs tablet Commonly known as:  SENOKOT Take 2 tablets by mouth 2 (two) times daily.   torsemide 100 MG tablet Commonly known as:  DEMADEX Take 100 mg by mouth daily.       Immunizations: Immunization History  Administered Date(s) Administered  . PPD Test 02/27/2016     Physical Exam: Vitals:   02/29/16 1520  BP: 134/84  Pulse: 79  Resp: 17  Temp: 97.8 F (36.6 C)  TempSrc: Oral  SpO2: 98%  Weight: 246 lb (111.6 kg)  Height: 5\' 9"  (1.753 m)   Body mass index is 36.33 kg/m.  General- adult female, obese, in no acute distress Head- normocephalic, atraumatic Nose- no nasal discharge Throat- moist mucus membrane Eyes- PERRLA, EOMI, no pallor, no icterus Neck- no cervical lymphadenopathy Cardiovascular- normal s1,s2, no murmur, trace left leg edema Respiratory- bilateral clear to auscultation, no wheeze, no rhonchi, no crackles, no use of accessory muscles. On 2 l o2 by [place] Abdomen- bowel sounds present, soft, non tender, PD catheter on right abdominal quadrant Musculoskeletal- right BKA, able to move all other 3 extremities, generalized weakness Neurological- alert and oriented to person, place and time Skin- warm and dry Psychiatry- normal mood and affect    Labs reviewed: Basic Metabolic Panel:  Recent Labs  16/12/9600/12/17 0547  07/21/15 0427  11/19/15 1547  01/04/16 1055  01/19/16 0122 01/20/16 0820 01/26/16 02/25/16 1328 02/27/16 1025  NA 140  < > 128*  < >  --   < > 130*  < > 136 137 136* 135  --   K 4.7  < > 4.2  < >   --   < > 3.9  < > 3.1* 3.6 3.6 4.0  --   CL 109  < > 91*  < >  --   < > 91*  --  98* 99*  --  100*  --   CO2  22  < > 22  < >  --   < > 29  --  31 31  --  25  --   GLUCOSE 181*  < > 157*  < >  --   < > 147*  --  169* 107*  --  125*  --   BUN 64*  < > 62*  < >  --   < > 51*  < > 20 28* 23* 42*  --   CREATININE 7.10*  < > 9.89*  < >  --   < > 5.58*  < > 3.15* 3.91* 3.5* 5.21*  --   CALCIUM 7.8*  < > 5.9*  < >  --   < > 10.5*  --  8.7* 9.2  --  9.4  --   MG 2.4  --  1.9  --  1.9  --   --   --   --   --   --   --   --   PHOS 8.0*  8.0*  < >  --   < >  --   < > 3.5  --   --  4.7*  --   --  4.9*  < > = values in this interval not displayed. Liver Function Tests:  Recent Labs  08/23/15 2319  12/25/15 1434  01/04/16 1055 01/10/16 01/20/16 0820 01/26/16 02/25/16 1328  AST 13*  --  17  --   --  7*  --  8* 28  ALT 7*  --  9*  --   --   --   --  3* 9*  ALKPHOS 105  --  66  --   --  80  --  88 70  BILITOT 0.6  --  0.5  --   --   --   --   --  0.7  PROT 5.4*  --  5.0*  --   --   --   --   --  5.6*  ALBUMIN 1.5*  < > 2.2*  < > 1.9*  --  1.9*  --  2.5*  < > = values in this interval not displayed.  Recent Labs  07/15/15 1639 08/23/15 2319  LIPASE 23 14    Recent Labs  08/23/15 2319 02/25/16 1449  AMMONIA 29 29   CBC:  Recent Labs  12/07/15 0800  12/25/15 1434  01/19/16 0122 01/20/16 0820 01/26/16 02/25/16 1328  WBC 6.4  < > 11.0  < > 7.6 8.2 7.7 8.3  NEUTROABS 4.1  --  9.6*  --  5.3  --   --   --   HGB 9.4*  < > 7.7*  < > 7.8* 7.3* 8.6* 11.3*  HCT 27.5*  < > 23.5*  < > 25.8* 24.1* 28* 34.5*  MCV 96.8  < > 95.2  < > 90.5 90.9  --  86.9  PLT 204  < > 226  < > 448* 456* 412* 327  < > = values in this interval not displayed. Cardiac Enzymes:  Recent Labs  01/19/16 0455 01/19/16 0801 01/19/16 1124  TROPONINI 0.04* 0.04* 0.03*   BNP: Invalid input(s): POCBNP CBG:  Recent Labs  02/26/16 2100 02/27/16 0731 02/27/16 1630  GLUCAP 100* 87 117*    Radiological  Exams: Dg Chest 1 View  Result Date: 02/25/2016 CLINICAL DATA:  50 year old female with 1 week history of confusion EXAM: CHEST 1 VIEW COMPARISON:  Prior chest x-ray 01/19/2016 FINDINGS: Left IJ approach  tunneled hemodialysis catheter. Catheter tip projects over the distal SVC. Borderline cardiomegaly is similar compared to prior. Trace atherosclerotic calcifications again noted in the transverse aorta. No evidence of pulmonary edema. Interval improvement in the left lower lobe atelectasis and associated effusion compared to the prior radiograph. No focal airspace consolidation, effusion or pneumothorax. No acute osseous abnormality. IMPRESSION: 1. No acute cardiopulmonary process. 2. Interval resolution of small left pleural effusion and associated atelectasis compared to 01/19/2016. 3. Left IJ approach tunneled hemodialysis catheter with the tip overlying the mid SVC. Electronically Signed   By: Malachy Moan M.D.   On: 02/25/2016 14:26   Ct Head Wo Contrast  Result Date: 02/25/2016 CLINICAL DATA:  Altered mental status. EXAM: CT HEAD WITHOUT CONTRAST TECHNIQUE: Contiguous axial images were obtained from the base of the skull through the vertex without intravenous contrast. COMPARISON:  CT scan of December 30, 2015. FINDINGS: Brain: No evidence of acute infarction, hemorrhage, hydrocephalus, extra-axial collection or mass lesion/mass effect. Vascular: Atherosclerosis of carotid siphons is noted. Skull: Normal. Negative for fracture or focal lesion. Sinuses/Orbits: No acute finding. Other: None. IMPRESSION: No acute intracranial abnormality seen. Electronically Signed   By: Lupita Raider, M.D.   On: 02/25/2016 14:48     Assessment/Plan   Physical deconditioning Will have her work with physical therapy and occupational therapy team to help with gait training and muscle strengthening exercises.fall precautions. Skin care. Encourage to be out of bed.   ESRD On HD 3 days a week. Reviewed  renal notes. Continue renavite  Stage 2 pressure ulcer To her coccyx, provide wound care and pressure ulcer prophylaxis to be taken  Anemia of chronic disease Monitor cbc periodically  CAD Chest pain free. Continue plavix 75 mg daily with hydralazine 25 mg daily, lopressor 50 mg bid, lisinopril 10 mg daily, ranexa 500 mg bid and isosorbide mononitrate 90 mg daily. Cardiology follow up. Continue prn SL NTG  Chronic constipation Continue lactulose 15 cc daily with senna and miralax, monitor  CHF Continue torsemide 100 mg daily, hydralazine 25 mg daily, lopressor 50 mg bid, lisinopril 10 mg, imdur 90 mg daily . Continue o2 by [employer]  Bipolar 1 disorder Stable. Continue keppra and monitor  Dm with renal impairment Lab Results  Component Value Date   HGBA1C 6.2 (H) 09/13/2015   Continue lantus 8 u daily and monitor cbg. Check a1c.    Chronic low back pain Continue percocet 5-325 mg 1 tab q6h prn pain. Will have patient work with PT/OT as tolerated to regain strength and restore function.  Fall precautions are in place.  gerd Stable symptom, continue omeprazole  PVD S/p RLE BKA. Continue plavix and percocet 5-325 mg q6h prn phantom pain     Goals of care: long term care   Labs/tests ordered: cbc, bmp 1 week  Family/ staff Communication: reviewed care plan with patient and nursing supervisor    Oneal Grout, MD Internal Medicine [facility] [address] [place], [place] [address] Cell Phone (Monday-Friday 8 am - 5 pm): [phone] On Call: [phone] and follow prompts after 5 pm and on weekends Office Phone: [phone] Office Fax: [phone]

## 2016-03-01 LAB — CULTURE, BLOOD (ROUTINE X 2)
CULTURE: NO GROWTH
Culture: NO GROWTH

## 2016-03-01 LAB — BODY FLUID CULTURE: Culture: NO GROWTH

## 2016-03-03 ENCOUNTER — Emergency Department (HOSPITAL_COMMUNITY)
Admission: EM | Admit: 2016-03-03 | Discharge: 2016-03-04 | Disposition: A | Discharge status: Home / Self Care | Payer: Medicare HMO | Attending: Emergency Medicine | Admitting: Emergency Medicine

## 2016-03-03 ENCOUNTER — Emergency Department (HOSPITAL_COMMUNITY): Payer: Medicare HMO

## 2016-03-03 ENCOUNTER — Encounter (HOSPITAL_COMMUNITY): Payer: Self-pay | Admitting: Emergency Medicine

## 2016-03-03 DIAGNOSIS — J45909 Unspecified asthma, uncomplicated: Secondary | ICD-10-CM | POA: Insufficient documentation

## 2016-03-03 DIAGNOSIS — Z992 Dependence on renal dialysis: Secondary | ICD-10-CM | POA: Diagnosis not present

## 2016-03-03 DIAGNOSIS — I132 Hypertensive heart and chronic kidney disease with heart failure and with stage 5 chronic kidney disease, or end stage renal disease: Secondary | ICD-10-CM | POA: Insufficient documentation

## 2016-03-03 DIAGNOSIS — I5032 Chronic diastolic (congestive) heart failure: Secondary | ICD-10-CM | POA: Insufficient documentation

## 2016-03-03 DIAGNOSIS — R079 Chest pain, unspecified: Secondary | ICD-10-CM | POA: Insufficient documentation

## 2016-03-03 DIAGNOSIS — Z87891 Personal history of nicotine dependence: Secondary | ICD-10-CM | POA: Insufficient documentation

## 2016-03-03 DIAGNOSIS — I252 Old myocardial infarction: Secondary | ICD-10-CM | POA: Diagnosis not present

## 2016-03-03 DIAGNOSIS — N186 End stage renal disease: Secondary | ICD-10-CM | POA: Diagnosis not present

## 2016-03-03 DIAGNOSIS — E1122 Type 2 diabetes mellitus with diabetic chronic kidney disease: Secondary | ICD-10-CM | POA: Diagnosis not present

## 2016-03-03 DIAGNOSIS — Z955 Presence of coronary angioplasty implant and graft: Secondary | ICD-10-CM | POA: Diagnosis not present

## 2016-03-03 DIAGNOSIS — I2510 Atherosclerotic heart disease of native coronary artery without angina pectoris: Secondary | ICD-10-CM | POA: Diagnosis not present

## 2016-03-03 LAB — BASIC METABOLIC PANEL WITH GFR
Anion gap: 10 (ref 5–15)
BUN: 31 mg/dL — ABNORMAL HIGH (ref 6–20)
CO2: 26 mmol/L (ref 22–32)
Calcium: 9.3 mg/dL (ref 8.9–10.3)
Chloride: 94 mmol/L — ABNORMAL LOW (ref 101–111)
Creatinine, Ser: 4.16 mg/dL — ABNORMAL HIGH (ref 0.44–1.00)
GFR calc Af Amer: 13 mL/min — ABNORMAL LOW
GFR calc non Af Amer: 11 mL/min — ABNORMAL LOW
Glucose, Bld: 183 mg/dL — ABNORMAL HIGH (ref 65–99)
Potassium: 3.3 mmol/L — ABNORMAL LOW (ref 3.5–5.1)
Sodium: 130 mmol/L — ABNORMAL LOW (ref 135–145)

## 2016-03-03 LAB — I-STAT TROPONIN, ED: Troponin i, poc: 0.01 ng/mL (ref 0.00–0.08)

## 2016-03-03 LAB — CBC WITH DIFFERENTIAL/PLATELET
BASOS ABS: 0 10*3/uL (ref 0.0–0.1)
Basophils Relative: 0 %
EOS ABS: 0.2 10*3/uL (ref 0.0–0.7)
EOS PCT: 2 %
HCT: 38.6 % (ref 36.0–46.0)
Hemoglobin: 12.1 g/dL (ref 12.0–15.0)
LYMPHS ABS: 1.9 10*3/uL (ref 0.7–4.0)
Lymphocytes Relative: 19 %
MCH: 26.8 pg (ref 26.0–34.0)
MCHC: 31.3 g/dL (ref 30.0–36.0)
MCV: 85.4 fL (ref 78.0–100.0)
MONO ABS: 0.7 10*3/uL (ref 0.1–1.0)
Monocytes Relative: 7 %
Neutro Abs: 6.9 10*3/uL (ref 1.7–7.7)
Neutrophils Relative %: 72 %
PLATELETS: 364 10*3/uL (ref 150–400)
RBC: 4.52 MIL/uL (ref 3.87–5.11)
RDW: 15.2 % (ref 11.5–15.5)
WBC: 9.6 10*3/uL (ref 4.0–10.5)

## 2016-03-03 MED ORDER — NITROGLYCERIN 2 % TD OINT
1.0000 [in_us] | TOPICAL_OINTMENT | Freq: Four times a day (QID) | TRANSDERMAL | Status: DC
  Filled 2016-03-03: qty 1

## 2016-03-03 MED ORDER — ASPIRIN 81 MG PO CHEW
243.0000 mg | CHEWABLE_TABLET | Freq: Once | ORAL | Status: AC
  Administered 2016-03-03: 243 mg via ORAL
  Filled 2016-03-03: qty 3

## 2016-03-03 MED ORDER — LORAZEPAM 1 MG PO TABS
0.5000 mg | ORAL_TABLET | Freq: Once | ORAL | Status: AC
  Administered 2016-03-04: 0.5 mg via ORAL
  Filled 2016-03-03: qty 1

## 2016-03-03 MED ORDER — GI COCKTAIL ~~LOC~~
30.0000 mL | Freq: Once | ORAL | Status: AC
  Administered 2016-03-03: 30 mL via ORAL
  Filled 2016-03-03: qty 30

## 2016-03-03 NOTE — ED Provider Notes (Signed)
Emergency Department Provider Note   I have reviewed the triage vital signs and the nursing notes.   HISTORY  Chief Complaint Chest Pain   HPI Kimberly Montgomery is a 50 y.o. female with PMH of CAD, ESRD on HD, CHF, bipolar disorder, GERD, and DM presents to the emergency department for evaluation of chest pain. Symptoms began shortly prior to arrival. Patient describes pain in the center of her chest that is radiating slightly to the left and into her neck. No arm symptoms. No associated dyspnea or pleuritic chest pain. No obvious exacerbating or alleviating factors. Patient reports a history of prior MI that felt similar. Denies fever, chills, vomiting, diarrhea.    Past Medical History:  Diagnosis Date  . Anginal pain (HCC)   . Anxiety   . Asthma   . Bipolar disorder (HCC)   . CAD (coronary artery disease)   . CHF (congestive heart failure) (HCC)   . Chronic lower back pain   . Depression   . Endometriosis   . ESRD (end stage renal disease) on dialysis (HCC)    "[facility]; [address]; [place]; TTS" (01/19/2016)  . Gastroparesis   . GERD (gastroesophageal reflux disease)   . History of blood transfusion "several"   "my blood would get low; low RBC"  . History of hiatal hernia   . HLD (hyperlipidemia)   . Hypertension   . Migraine    "monthly" (01/19/2016)  . Myocardial infarction 2017   "~ 3 wks ago" (01/19/2016)  . Renal disorder   . Seizures (HCC) 07/2015   "I've only had the 1; don't know what from" (01/19/2016)  . Type II diabetes mellitus [facility](HCC)     Patient Active Problem List   Diagnosis Date Noted  . Chronic diastolic congestive heart failure (HCC) 01/22/2016  . Pressure injury of skin 01/20/2016  . Bacteremia 12/27/2015  . Essential hypertension 11/21/2015  . Hyperlipidemia 11/21/2015  . Anemia of chronic disease 11/21/2015  . Acute respiratory failure with hypoxia (HCC) 11/21/2015  . Acute diastolic CHF (congestive heart failure) (HCC) 11/21/2015  .  ESRD on dialysis (HCC) 11/21/2015  . 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 NSTEMI (non-ST elevated myocardial infarction) (HCC) 11/20/2015  . Chest pain, rule out acute myocardial infarction 11/18/2015  . Bipolar I disorder, most recent episode depressed (HCC)   . Altered mental status 08/24/2015  . ESRD (end stage renal disease) (HCC)   . Ileus (HCC)   . Bipolar I disorder (HCC) 07/25/2015  . Seizures (HCC) 07/25/2015  . Peritonitis (HCC) 07/16/2015  . Unstable angina (HCC) 07/09/2015  . ESRD on peritoneal dialysis (HCC) 07/09/2015  . Accelerated hypertension 07/09/2015  . Type 2 diabetes mellitus (HCC) 07/09/2015  . CAD (coronary artery disease) 07/09/2015  . HLD (hyperlipidemia) 07/09/2015  . GERD (gastroesophageal reflux disease) 07/09/2015    Past Surgical History:  Procedure Laterality Date  . ABDOMINAL HYSTERECTOMY     "partial"  . BELOW KNEE LEG AMPUTATION Right 2010?  [name]. CARDIAC CATHETERIZATION Right 07/10/2015   Procedure: Left Heart Cath and Coronary Angiography;  Surgeon: Laurier NancyShaukat A Khan, MD;  Location: [facility] INVASIVE CV LAB;  Service: Cardiovascular;  Laterality: Right;  . CARDIAC CATHETERIZATION Right 09/14/2015   Procedure: Left Heart Cath and Coronary Angiography;  Surgeon: Laurier NancyShaukat A Khan, MD;  Location: [facility] INVASIVE CV LAB;  Service: Cardiovascular;  Laterality: Right;  . CARDIAC CATHETERIZATION N/A 09/14/2015   Procedure: Coronary Stent Intervention;  Surgeon: Alwyn Peawayne D Callwood, MD;  Location: [facility] INVASIVE CV LAB;  Service:  Cardiovascular;  Laterality: N/A;  . CARDIAC CATHETERIZATION Right 11/20/2015   Procedure: Left Heart Cath and Coronary Angiography;  Surgeon: Laurier Nancy, MD;  Location: [facility] INVASIVE CV LAB;  Service: Cardiovascular;  Laterality: Right;  . CORONARY ANGIOPLASTY WITH STENT PLACEMENT  <2017   @ [facility]/notes 07/03/2013  . HYSTEROTOMY    . PERIPHERAL VASCULAR CATHETERIZATION N/A 08/30/2015   Procedure: Dialysis/Perma Catheter Insertion;  Surgeon: Annice Needy, MD;   Location: [facility] INVASIVE CV LAB;  Service: Cardiovascular;  Laterality: N/A;  . PERIPHERAL VASCULAR CATHETERIZATION N/A 01/01/2016   Procedure: Dialysis/Perma Catheter Insertion;  Surgeon: Annice Needy, MD;  Location: [facility] INVASIVE CV LAB;  Service: Cardiovascular;  Laterality: N/A;  . PERITONEAL CATHETER INSERTION  11/03/2013   [name] 11/03/2013  . PERITONEAL CATHETER REMOVAL  01/01/2016   "took the one from May out; put new PD cath in" (01/19/2016)  . PERITONEAL CATHETER REMOVAL  11/03/2013; 02/11/2014   [name] 11/03/2013; Removal of tunneled catheter/notes 02/11/2014  . SALPINGOOPHORECTOMY Right    [name] 07/03/2013  . TUBAL LIGATION      Current Outpatient Rx  . Order #: [id number] Class: Historical Med  . Order #: [id number] Class: Normal  . Order #: [id number] Class: Historical Med  . Order #: [id number] Class: Normal  . Order #: [id number] Class: Historical Med  . Order #: [id number] Class: No Print  . Order #: [id number] Class: No Print  . Order #: [id number] Class: Historical Med  . Order #: [id number] Class: Normal  . Order #: [id number] Class: No Print  . Order #: [id number] Class: No Print  . Order #: [id number] Class: Historical Med  . Order #: [id number] Class: Normal  . Order #: [id number] Class: Historical Med  . Order #: [id number] Class: Historical Med  . Order #: [id number] Class: Print  . Order #: [id number] Class: Print  . Order #: [id number] Class: Normal  . Order #: [id number] Class: Normal  . Order #: [id number] Class: Historical Med  . Order #: [id number] Class: Historical Med    Allergies Cephalosporins; Penicillins; Lamictal [lamotrigine]; Phenergan [promethazine hcl]; Pravastatin; and Sulfa antibiotics  Family History  Problem Relation Age of Onset  . CAD    . Diabetes    . Bipolar disorder    . Cervical cancer Mother     Social History Social History  Substance Use Topics  . Smoking status: Former Smoker    Packs/day: 0.75    Types: Cigarettes    Quit date: 07/01/2015  . Smokeless  tobacco: Never Used  . Alcohol use 0.0 oz/week     Comment: 01/19/2016 "might have a couple drinks/year"    Review of Systems  Constitutional: No fever/chills Eyes: No visual changes. ENT: No sore throat. Cardiovascular: Positive chest pain. Respiratory: Denies shortness of breath. Gastrointestinal: No abdominal pain.  No nausea, no vomiting.  No diarrhea.  No constipation. Genitourinary: Negative for dysuria. Musculoskeletal: Negative for back pain. Skin: Negative for rash. Neurological: Negative for headaches, focal weakness or numbness. Positive increasing anxiety.   10-point ROS otherwise negative.  ____________________________________________   PHYSICAL EXAM:  VITAL SIGNS: ED Triage Vitals  Enc Vitals Group     BP 03/03/16 2054 (!) 215/96     Pulse Rate 03/03/16 2054 73     Resp 03/03/16 2054 17     Temp 03/03/16 2054 98.3 F (36.8 C)     Temp Source 03/03/16 2054 Oral     SpO2 03/03/16 2054 100 %     Weight 03/03/16 2055 246 lb (111.6 kg)     Height 03/03/16 2055 5\' 9"  (1.753 m)  Pain Score 03/03/16 2138 7   Constitutional: Alert and oriented. Well appearing and in no acute distress. Eyes: Conjunctivae are normal.  Head: Atraumatic. Nose: No congestion/rhinnorhea. Mouth/Throat: Mucous membranes are moist. Oropharynx non-erythematous. Neck: No stridor.  Cardiovascular: Normal rate, regular rhythm. Good peripheral circulation. Grossly normal heart sounds.   Respiratory: Normal respiratory effort.  No retractions. Lungs CTAB. Gastrointestinal: Soft and nontender. No distention. Vas-cath in left upper chest. Peritoneal HD cath in right abdomen.  Musculoskeletal: No lower extremity tenderness nor edema. No gross deformities of extremities. Neurologic:  Normal speech and language. No gross focal neurologic deficits are appreciated.  Skin:  Skin is warm, dry and intact. No rash noted.  ____________________________________________   LABS (all labs ordered are  listed, but only abnormal results are displayed)  Labs Reviewed  BASIC METABOLIC PANEL - Abnormal; Notable for the following:       Result Value   Sodium 130 (*)    Potassium 3.3 (*)    Chloride 94 (*)    Glucose, Bld 183 (*)    BUN 31 (*)    Creatinine, Ser 4.16 (*)    GFR calc non Af Amer 11 (*)    GFR calc Af Amer 13 (*)    All other components within normal limits  TROPONIN I - Abnormal; Notable for the following:    Troponin I 0.05 (*)    All other components within normal limits  CBC WITH DIFFERENTIAL/PLATELET  I-STAT TROPOININ, ED   ____________________________________________  EKG   EKG Interpretation  Date/Time:  Sunday March 03 2016 20:47:37 EST Ventricular Rate:  72 PR Interval:    QRS Duration: 104 QT Interval:  421 QTC Calculation: 461 R Axis:   52 Text Interpretation:  Sinus rhythm Probable left atrial enlargement Nonspecific T abnormalities, lateral leads ST elev, probable normal early repol pattern No STEMI.  Confirmed by Adonias Demore MD, [place] [phone]) on 03/03/2016 8:52:11 PM       ____________________________________________  RADIOLOGY  Dg Chest 2 View  Result Date: 03/03/2016 CLINICAL DATA:  Acute onset of left-sided chest pain, radiating to the left scapula. Shortness of breath. Initial encounter. EXAM: CHEST  2 VIEW COMPARISON:  Chest radiograph performed 02/25/2016 FINDINGS: The lungs are well-aerated. Mild vascular congestion is noted. Small bilateral pleural effusions are seen. There is no evidence of focal opacification or pneumothorax. The heart is borderline normal in size. No acute osseous abnormalities are seen. A left IJ dual-lumen catheter is noted ending about the mid SVC. IMPRESSION: Mild vascular congestion noted. Small bilateral pleural effusions noted. Lungs otherwise grossly clear. Electronically Signed   By: Roanna Raider M.D.   On: 03/03/2016 22:58    ____________________________________________   PROCEDURES  Procedure(s)  performed:   Procedures  None ____________________________________________   INITIAL IMPRESSION / ASSESSMENT AND PLAN / ED COURSE  Pertinent labs & imaging results that were available during my care of the patient were reviewed by me and considered in my medical decision making (see chart for details).  Patient resents emergency pertinent for evaluation of chest pain. Review the records patient has been to the emergency department and admitted several times this year for chest pain. She has had 3 heart catheterizations this year with no stenosis greater than 50%. This presentation seems similar to today's. EKG shows no acute ischemic change. Patient has heart score of 4. No other symptoms. Patient labs are at baseline. Awaiting chest x-ray. Plan to control pain and trend biomarkers. We will likely discharge if troponin remains normal  with multiple caths this year and recommend outpatient primary care physician follow-up.   11:42 PM CXR negative for infiltrate. Suspect acute on chronic chest pain. Plan for biomarker trending and likely discharge home. Discussed with patient who reports improved pain but is experiencing some anxiety. She reports that her Bipolar medications were stopped recently and she is having anxiety. No SI/HI. Plan for small amount of Ativan.   ____________________________________________  FINAL CLINICAL IMPRESSION(S) / ED DIAGNOSES  Final diagnoses:  Chest pain, unspecified type     MEDICATIONS GIVEN DURING THIS VISIT:  Medications  aspirin chewable tablet 243 mg (243 mg Oral Given 03/03/16 2136)  gi cocktail (Maalox,Lidocaine,Donnatal) (30 mLs Oral Given 03/03/16 2303)  LORazepam (ATIVAN) tablet 0.5 mg (0.5 mg Oral Given 03/04/16 0009)  metoprolol tartrate (LOPRESSOR) tablet 50 mg (50 mg Oral Given 03/04/16 0044)  hydrALAZINE (APRESOLINE) tablet 25 mg (25 mg Oral Given 03/04/16 0045)     NEW OUTPATIENT MEDICATIONS STARTED DURING THIS VISIT:  None  Note:   This document was prepared using Dragon voice recognition software and may include unintentional dictation errors.  Alona Bene, MD Emergency Medicine   Maia Plan, MD 03/04/16 [phone]

## 2016-03-03 NOTE — ED Notes (Signed)
Patient transported to X-ray 

## 2016-03-03 NOTE — ED Triage Notes (Signed)
Pt arrived via EMS from [facility]. Pt reports centralized cp that began 1 hr ago. States it feels "crushing" and that she had a previous heart attack 01-19-16. Pt took 1 nitro at facility and 324 aspirin in route with ems. Pt states she feels "a little SOB." Pt speaking full sentences; resp e/u; pain 7/10. A&Ox4.

## 2016-03-04 DIAGNOSIS — R079 Chest pain, unspecified: Secondary | ICD-10-CM | POA: Diagnosis not present

## 2016-03-04 LAB — TROPONIN I: Troponin I: 0.05 ng/mL (ref <0.03)

## 2016-03-04 MED ORDER — METOPROLOL TARTRATE 25 MG PO TABS
50.0000 mg | ORAL_TABLET | Freq: Once | ORAL | Status: AC
  Administered 2016-03-04: 50 mg via ORAL
  Filled 2016-03-04: qty 2

## 2016-03-04 MED ORDER — HYDRALAZINE HCL 25 MG PO TABS
25.0000 mg | ORAL_TABLET | Freq: Once | ORAL | Status: AC
  Administered 2016-03-04: 25 mg via ORAL
  Filled 2016-03-04: qty 1

## 2016-03-04 MED ORDER — RANOLAZINE ER 500 MG PO TB12
500.0000 mg | ORAL_TABLET | Freq: Two times a day (BID) | ORAL | Status: DC
  Administered 2016-03-04: 500 mg via ORAL
  Filled 2016-03-04: qty 1

## 2016-03-04 NOTE — ED Notes (Signed)
Troponin 0.05; EDP made aware.

## 2016-03-04 NOTE — ED Notes (Signed)
Pt has peritoneal dialysis and hemodialysis port. Hemodialysis port accessed. Pt states she received dialysis through it yesterday.

## 2016-03-08 ENCOUNTER — Other Ambulatory Visit: Payer: Self-pay | Admitting: *Deleted

## 2016-03-08 LAB — BASIC METABOLIC PANEL
BUN: 25 mg/dL — AB (ref 4–21)
Creatinine: 3.8 mg/dL — AB (ref 0.5–1.1)
GLUCOSE: 130 mg/dL
Potassium: 4 mmol/L (ref 3.4–5.3)
Sodium: 137 mmol/L (ref 137–147)

## 2016-03-08 LAB — CBC AND DIFFERENTIAL
HEMATOCRIT: 35 % — AB (ref 36–46)
HEMOGLOBIN: 11 g/dL — AB (ref 12.0–16.0)
PLATELETS: 339 10*3/uL (ref 150–399)
WBC: 8.5 10*3/mL

## 2016-03-08 MED ORDER — OXYCODONE-ACETAMINOPHEN 5-325 MG PO TABS: ORAL_TABLET | ORAL | 0 refills | Status: DC

## 2016-03-08 NOTE — Telephone Encounter (Signed)
[facility] [phone] Fax: [phone]  

## 2016-03-09 ENCOUNTER — Encounter (HOSPITAL_COMMUNITY): Payer: Self-pay

## 2016-03-09 ENCOUNTER — Emergency Department (HOSPITAL_COMMUNITY)
Admission: EM | Admit: 2016-03-09 | Discharge: 2016-03-09 | Disposition: A | Discharge status: Home / Self Care | Payer: Medicare HMO | Attending: Emergency Medicine | Admitting: Emergency Medicine

## 2016-03-09 DIAGNOSIS — I132 Hypertensive heart and chronic kidney disease with heart failure and with stage 5 chronic kidney disease, or end stage renal disease: Secondary | ICD-10-CM | POA: Diagnosis present

## 2016-03-09 DIAGNOSIS — E119 Type 2 diabetes mellitus without complications: Secondary | ICD-10-CM | POA: Diagnosis not present

## 2016-03-09 DIAGNOSIS — Z79899 Other long term (current) drug therapy: Secondary | ICD-10-CM | POA: Diagnosis not present

## 2016-03-09 DIAGNOSIS — I2510 Atherosclerotic heart disease of native coronary artery without angina pectoris: Secondary | ICD-10-CM | POA: Diagnosis not present

## 2016-03-09 DIAGNOSIS — I252 Old myocardial infarction: Secondary | ICD-10-CM | POA: Insufficient documentation

## 2016-03-09 DIAGNOSIS — G4489 Other headache syndrome: Secondary | ICD-10-CM | POA: Insufficient documentation

## 2016-03-09 DIAGNOSIS — Z87891 Personal history of nicotine dependence: Secondary | ICD-10-CM | POA: Diagnosis not present

## 2016-03-09 DIAGNOSIS — I5032 Chronic diastolic (congestive) heart failure: Secondary | ICD-10-CM | POA: Diagnosis not present

## 2016-03-09 DIAGNOSIS — I10 Essential (primary) hypertension: Secondary | ICD-10-CM

## 2016-03-09 DIAGNOSIS — Z992 Dependence on renal dialysis: Secondary | ICD-10-CM | POA: Insufficient documentation

## 2016-03-09 DIAGNOSIS — N186 End stage renal disease: Secondary | ICD-10-CM | POA: Diagnosis not present

## 2016-03-09 DIAGNOSIS — J45909 Unspecified asthma, uncomplicated: Secondary | ICD-10-CM | POA: Insufficient documentation

## 2016-03-09 DIAGNOSIS — Z794 Long term (current) use of insulin: Secondary | ICD-10-CM | POA: Diagnosis not present

## 2016-03-09 MED ORDER — METOCLOPRAMIDE HCL 5 MG/ML IJ SOLN
10.0000 mg | Freq: Once | INTRAMUSCULAR | Status: AC
  Administered 2016-03-09: 10 mg via INTRAMUSCULAR
  Filled 2016-03-09: qty 2

## 2016-03-09 MED ORDER — DIPHENHYDRAMINE HCL 50 MG/ML IJ SOLN
25.0000 mg | Freq: Once | INTRAMUSCULAR | Status: AC
  Administered 2016-03-09: 25 mg via INTRAMUSCULAR
  Filled 2016-03-09: qty 1

## 2016-03-09 NOTE — ED Triage Notes (Signed)
Pt from [facility]. Pt sent for ha x 3 days. Pt complaining of generalized head pain, no neuro deficits. Pt hypertensive with EMS 186/72. Pt on 2L O2 at home. Pt recently had peritoneal dialysis cath removed and replaced with L chest port. Scheduled for dialysis in AM.

## 2016-03-09 NOTE — Discharge Instructions (Signed)

## 2016-03-09 NOTE — ED Notes (Signed)
Called [facility] for transport back to [facility]

## 2016-03-09 NOTE — ED Provider Notes (Signed)
[facility]-EMERG[facility]Y DEPT Provider Note   CSN: [id number] Arrival date & time: 03/09/16  0234  By signing my name below, I, Alyssa Grove, attest that this documentation has been prepared under the direction and in the presence of Zadie Rhine, MD. Electronically Signed: Alyssa Grove, ED Scribe. 03/09/16. 3:17 AM.   History   Chief Complaint Chief Complaint  Patient presents with  . Headache  . Hypertension   HPI Comments: Kimberly Montgomery is a 50 y.o. female who presents to the Emergency Department complaining of a sudden onset, worsening, headache onset 11:30 PM tonight. PT states headache is located in the back of her head and across her forehead. She states she has headaches approximately once a week similar to this. She reports associated visual disturbance and describes having a general blurry vision. Pt is currently on dialysis and her next appointment is in the morning. Pt denies recent falls or head trauma. She currently takes Plavix. Pt denies fever, vomiting, chest pain, abdominal pain, extremity weakness.   The history is provided by the patient. No language interpreter was used.  Headache   This is a new problem. The current episode started more than 2 days ago. The problem occurs constantly. The problem has not changed since onset.The pain is moderate. The pain does not radiate. Pertinent negatives include no fever and no vomiting.   Past Medical History:  Diagnosis Date  . Anginal pain (HCC)   . Anxiety   . Asthma   . Bipolar disorder (HCC)   . CAD (coronary artery disease)   . CHF (congestive heart failure) (HCC)   . Chronic lower back pain   . Depression   . Endometriosis   . ESRD (end stage renal disease) on dialysis (HCC)    "[facility]; [address]; [place]; TTS" (01/19/2016)  . Gastroparesis   . GERD (gastroesophageal reflux disease)   . History of blood transfusion "several"   "my blood would get low; low RBC"  . History of hiatal hernia   . HLD  (hyperlipidemia)   . Hypertension   . Migraine    "monthly" (01/19/2016)  . Myocardial infarction 2017   "~ 3 wks ago" (01/19/2016)  . Renal disorder   . Seizures (HCC) 07/2015   "I've only had the 1; don't know what from" (01/19/2016)  . Type II diabetes mellitus [facility])     Patient Active Problem List   Diagnosis Date Noted  . Chronic diastolic congestive heart failure (HCC) 01/22/2016  . Pressure injury of skin 01/20/2016  . Bacteremia 12/27/2015  . Essential hypertension 11/21/2015  . Hyperlipidemia 11/21/2015  . Anemia of chronic disease 11/21/2015  . Acute respiratory failure with hypoxia (HCC) 11/21/2015  . Acute diastolic CHF (congestive heart failure) (HCC) 11/21/2015  . ESRD on dialysis (HCC) 11/21/2015  . 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 NSTEMI (non-ST elevated myocardial infarction) (HCC) 11/20/2015  . Chest pain, rule out acute myocardial infarction 11/18/2015  . Bipolar I disorder, most recent episode depressed (HCC)   . Altered mental status 08/24/2015  . ESRD (end stage renal disease) (HCC)   . Ileus (HCC)   . Bipolar I disorder (HCC) 07/25/2015  . Seizures (HCC) 07/25/2015  . Peritonitis (HCC) 07/16/2015  . Unstable angina (HCC) 07/09/2015  . ESRD on peritoneal dialysis (HCC) 07/09/2015  . Accelerated hypertension 07/09/2015  . Type 2 diabetes mellitus (HCC) 07/09/2015  . CAD (coronary artery disease) 07/09/2015  . HLD (hyperlipidemia) 07/09/2015  . GERD (gastroesophageal reflux disease) 07/09/2015    Past Surgical History:  Procedure Laterality Date  . ABDOMINAL HYSTERECTOMY     "partial"  . BELOW KNEE LEG AMPUTATION Right 2010?  [name]. CARDIAC CATHETERIZATION Right 07/10/2015   Procedure: Left Heart Cath and Coronary Angiography;  Surgeon: Laurier NancyShaukat A Khan, MD;  Location: [facility] INVASIVE CV LAB;  Service: Cardiovascular;  Laterality: Right;  . CARDIAC CATHETERIZATION Right 09/14/2015   Procedure: Left Heart Cath and Coronary Angiography;  Surgeon: Laurier NancyShaukat A Khan, MD;   Location: [facility] INVASIVE CV LAB;  Service: Cardiovascular;  Laterality: Right;  . CARDIAC CATHETERIZATION N/A 09/14/2015   Procedure: Coronary Stent Intervention;  Surgeon: Alwyn Peawayne D Callwood, MD;  Location: [facility] INVASIVE CV LAB;  Service: Cardiovascular;  Laterality: N/A;  . CARDIAC CATHETERIZATION Right 11/20/2015   Procedure: Left Heart Cath and Coronary Angiography;  Surgeon: Laurier NancyShaukat A Khan, MD;  Location: [facility] INVASIVE CV LAB;  Service: Cardiovascular;  Laterality: Right;  . CORONARY ANGIOPLASTY WITH STENT PLACEMENT  <2017   @ [facility]/notes 07/03/2013  . HYSTEROTOMY    . PERIPHERAL VASCULAR CATHETERIZATION N/A 08/30/2015   Procedure: Dialysis/Perma Catheter Insertion;  Surgeon: Annice NeedyJason S Dew, MD;  Location: [facility] INVASIVE CV LAB;  Service: Cardiovascular;  Laterality: N/A;  . PERIPHERAL VASCULAR CATHETERIZATION N/A 01/01/2016   Procedure: Dialysis/Perma Catheter Insertion;  Surgeon: Annice NeedyJason S Dew, MD;  Location: [facility] INVASIVE CV LAB;  Service: Cardiovascular;  Laterality: N/A;  . PERITONEAL CATHETER INSERTION  11/03/2013   [name]/notes 11/03/2013  . PERITONEAL CATHETER REMOVAL  01/01/2016   "took the one from May out; put new PD cath in" (01/19/2016)  . PERITONEAL CATHETER REMOVAL  11/03/2013; 02/11/2014   [name]/notes 11/03/2013; Removal of tunneled catheter/notes 02/11/2014  . SALPINGOOPHORECTOMY Right    [name]/notes 07/03/2013  . TUBAL LIGATION      OB History    No data available       Home Medications    Prior to Admission medications   Medication Sig Start Date End Date Taking? Authorizing Provider  Amino Acids-Protein Hydrolys (FEEDING SUPPLEMENT, PRO-STAT SUGAR FREE 64,) LIQD Take 30 mLs by mouth 3 (three) times daily with meals.    Historical Provider, MD  calcium carbonate (TUMS - DOSED IN MG ELEMENTAL CALCIUM) 500 MG chewable tablet Chew 1 tablet (200 mg of elemental calcium total) by mouth 3 (three) times daily with meals. 08/02/15   Adrian SaranSital Mody, MD  clopidogrel (PLAVIX) 75 MG tablet Take 75 mg by mouth  daily.    Historical Provider, MD  colesevelam (WELCHOL) 625 MG tablet Take 1 tablet (625 mg total) by mouth 2 (two) times daily with a meal. 11/21/15   Katharina Caperima Vaickute, MD  hydrALAZINE (APRESOLINE) 25 MG tablet Take 25 mg by mouth daily.     Historical Provider, MD  insulin glargine (LANTUS) 100 UNIT/ML injection Inject 0.08 mLs (8 Units total) into the skin at bedtime. 08/30/15   Milagros LollSrikar Sudini, MD  isosorbide mononitrate (IMDUR) 60 MG 24 hr tablet Take 1.5 tablets (90 mg total) by mouth daily. 01/20/16   Vassie Lollarlos Madera, MD  lactulose [facility](CHRONULAC) 10 GM/15ML solution Take 10 g by mouth daily.     Historical Provider, MD  levETIRAcetam (KEPPRA) 500 MG tablet Take 1 tablet (500 mg total) by mouth every morning. 08/02/15   Adrian SaranSital Mody, MD  lisinopril (PRINIVIL,ZESTRIL) 10 MG tablet Take 1 tablet (10 mg total) by mouth daily. 01/21/16   Vassie Lollarlos Madera, MD  metoprolol (LOPRESSOR) 50 MG tablet Take 1 tablet (50 mg total) by mouth 2 (two) times daily. 09/15/15   Milagros LollSrikar Sudini, MD  multivitamin (RENA-VIT) TABS  tablet Take 1 tablet by mouth daily.    Historical Provider, MD  mupirocin ointment (BACTROBAN) 2 % Place 1 application into the nose 2 (two) times daily. 11/21/15   Katharina Caper, MD  nitroGLYCERIN (NITROSTAT) 0.4 MG SL tablet Place 0.4 mg under the tongue every 5 (five) minutes as needed for chest pain. Reported on 07/10/2015    Historical Provider, MD  omeprazole (PRILOSEC) 20 MG capsule Take 20 mg by mouth 2 (two) times daily before a meal.     Historical Provider, MD  ondansetron (ZOFRAN ODT) 4 MG disintegrating tablet Take 1 tablet (4 mg total) by mouth every 6 (six) hours as needed for nausea or vomiting. 07/16/15   Sharyn Creamer, MD  oxyCODONE-acetaminophen (PERCOCET/ROXICET) 5-325 MG tablet Take one tablet by mouth every 6 hours as needed for moderate pain. Do not exceed 4gm of Tylenol in 24 hours 03/08/16   Tiffany L Reed, DO  polyethylene glycol (MIRALAX / GLYCOLAX) packet Take 17 g by mouth daily. 08/02/15    Adrian Saran, MD  ranolazine (RANEXA) 500 MG 12 hr tablet Take 1 tablet (500 mg total) by mouth 2 (two) times daily. 07/11/15   Auburn Bilberry, MD  senna (SENOKOT) 8.6 MG TABS tablet Take 2 tablets by mouth 2 (two) times daily.     Historical Provider, MD  torsemide (DEMADEX) 100 MG tablet Take 100 mg by mouth daily.    Historical Provider, MD    Family History Family History  Problem Relation Age of Onset  . CAD    . Diabetes    . Bipolar disorder    . Cervical cancer Mother     Social History Social History  Substance Use Topics  . Smoking status: Former Smoker    Packs/day: 0.75    Types: Cigarettes    Quit date: 07/01/2015  . Smokeless tobacco: Never Used  . Alcohol use 0.0 oz/week     Comment: 01/19/2016 "might have a couple drinks/year"     Allergies   Cephalosporins; Penicillins; Lamictal [lamotrigine]; Phenergan [promethazine hcl]; Pravastatin; and Sulfa antibiotics   Review of Systems Review of Systems  Constitutional: Negative for fever.  Eyes: Positive for visual disturbance.  Cardiovascular: Negative for chest pain.  Gastrointestinal: Negative for abdominal pain and vomiting.  Neurological: Positive for headaches. Negative for weakness.  All other systems reviewed and are negative.    Physical Exam Updated Vital Signs BP 197/78 (BP Location: Right Arm)   Pulse 81   Temp 98.7 F (37.1 C) (Oral)   Resp 24   LMP 03/16/2015   SpO2 100%   Physical Exam CONSTITUTIONAL:Chronically ill appearing HEAD: Normocephalic/atraumatic EYES: EOMI/PERRL, no nystagmus, no ptosis ENMT: Mucous membranes moist NECK: supple no meningeal signs, no bruits CV: S1/S2 noted, no murmurs/rubs/gallops noted LUNGS: Lungs are clear to auscultation bilaterally, no apparent distress ABDOMEN: soft, nontender, no rebound or guarding NEURO:Awake/alert, face symmetric, no arm or leg drift is noted Equal 5/5 strength with shoulder abduction, elbow flex/extension Sensation to light touch  intact in all extremities EXTREMITIES: pulses normal, full ROM, s/p BKA Dialysis catheter noted to left UE SKIN: warm, color normal PSYCH: no abnormalities of mood noted, alert and oriented to situation  ED Treatments / Results  DIAGNOSTIC STUDIES: Oxygen Saturation is 100% on RA, normal by my interpretation.    Labs (all labs ordered are listed, but only abnormal results are displayed) Labs Reviewed - No data to display  EKG  EKG Interpretation None       Radiology No results  found.  Procedures Procedures   Medications Ordered in ED Medications  metoCLOPramide (REGLAN) injection 10 mg (10 mg Intramuscular Given 03/09/16 0343)  diphenhydrAMINE (BENADRYL) injection 25 mg (25 mg Intramuscular Given 03/09/16 0343)     Initial Impression / Assessment and Plan / ED Course  I have reviewed the triage vital signs and the nursing notes.    Clinical Course    I personally performed the services described in this documentation, which was scribed in my presence. The recorded information has been reviewed and is accurate.        3:56 AM  D/w Caryn Bee, nurse at [facility] When he got to work, she was reporting HA and she had elevated BP She was given  Percocet but HA persisted and repeat BP 185/96 Pt requested ER transfer She had no signs of stroke He has only cared for her for one week but does report she has frequent headaches 4:33 AM Pt stable Awake/alert She reports improvement She has no other complaints She admits to frequent HA similar to this I feel she is appropriate for discharge Labs deferred as pt is scheduled for dialysis later this morning and she had no other complaints  Final Clinical Impressions(s) / ED Diagnoses   Final diagnoses:  Other headache syndrome  Essential hypertension    New Prescriptions New Prescriptions   No medications on file     Zadie Rhine, MD 03/09/16 [phone]

## 2016-03-13 ENCOUNTER — Inpatient Hospital Stay
Admission: EM | Admit: 2016-03-13 | Discharge: 2016-03-16 | DRG: 371 | Disposition: A | Discharge status: Skilled Nursing Facility | Payer: Medicare HMO | Attending: Internal Medicine | Admitting: Internal Medicine

## 2016-03-13 ENCOUNTER — Ambulatory Visit: Payer: Medicare HMO | Admitting: Cardiovascular Disease

## 2016-03-13 ENCOUNTER — Ambulatory Visit: Payer: Medicare HMO | Admitting: Physician Assistant

## 2016-03-13 ENCOUNTER — Emergency Department: Payer: Medicare HMO

## 2016-03-13 ENCOUNTER — Encounter: Payer: Self-pay | Admitting: Emergency Medicine

## 2016-03-13 DIAGNOSIS — I10 Essential (primary) hypertension: Secondary | ICD-10-CM | POA: Diagnosis present

## 2016-03-13 DIAGNOSIS — I2510 Atherosclerotic heart disease of native coronary artery without angina pectoris: Secondary | ICD-10-CM | POA: Diagnosis present

## 2016-03-13 DIAGNOSIS — J45909 Unspecified asthma, uncomplicated: Secondary | ICD-10-CM | POA: Diagnosis present

## 2016-03-13 DIAGNOSIS — E785 Hyperlipidemia, unspecified: Secondary | ICD-10-CM | POA: Diagnosis present

## 2016-03-13 DIAGNOSIS — R601 Generalized edema: Secondary | ICD-10-CM

## 2016-03-13 DIAGNOSIS — R262 Difficulty in walking, not elsewhere classified: Secondary | ICD-10-CM

## 2016-03-13 DIAGNOSIS — G43909 Migraine, unspecified, not intractable, without status migrainosus: Secondary | ICD-10-CM | POA: Diagnosis present

## 2016-03-13 DIAGNOSIS — Z79891 Long term (current) use of opiate analgesic: Secondary | ICD-10-CM

## 2016-03-13 DIAGNOSIS — A0472 Enterocolitis due to Clostridium difficile, not specified as recurrent: Secondary | ICD-10-CM | POA: Diagnosis present

## 2016-03-13 DIAGNOSIS — I160 Hypertensive urgency: Secondary | ICD-10-CM | POA: Diagnosis present

## 2016-03-13 DIAGNOSIS — K529 Noninfective gastroenteritis and colitis, unspecified: Secondary | ICD-10-CM | POA: Diagnosis present

## 2016-03-13 DIAGNOSIS — Z87891 Personal history of nicotine dependence: Secondary | ICD-10-CM

## 2016-03-13 DIAGNOSIS — K3184 Gastroparesis: Secondary | ICD-10-CM | POA: Diagnosis present

## 2016-03-13 DIAGNOSIS — Z888 Allergy status to other drugs, medicaments and biological substances status: Secondary | ICD-10-CM

## 2016-03-13 DIAGNOSIS — Z794 Long term (current) use of insulin: Secondary | ICD-10-CM

## 2016-03-13 DIAGNOSIS — F319 Bipolar disorder, unspecified: Secondary | ICD-10-CM | POA: Diagnosis present

## 2016-03-13 DIAGNOSIS — F419 Anxiety disorder, unspecified: Secondary | ICD-10-CM | POA: Diagnosis present

## 2016-03-13 DIAGNOSIS — Z881 Allergy status to other antibiotic agents status: Secondary | ICD-10-CM

## 2016-03-13 DIAGNOSIS — M6281 Muscle weakness (generalized): Secondary | ICD-10-CM

## 2016-03-13 DIAGNOSIS — R569 Unspecified convulsions: Secondary | ICD-10-CM | POA: Diagnosis present

## 2016-03-13 DIAGNOSIS — M545 Low back pain: Secondary | ICD-10-CM | POA: Diagnosis present

## 2016-03-13 DIAGNOSIS — K219 Gastro-esophageal reflux disease without esophagitis: Secondary | ICD-10-CM | POA: Diagnosis present

## 2016-03-13 DIAGNOSIS — I953 Hypotension of hemodialysis: Secondary | ICD-10-CM | POA: Diagnosis present

## 2016-03-13 DIAGNOSIS — G8929 Other chronic pain: Secondary | ICD-10-CM | POA: Diagnosis present

## 2016-03-13 DIAGNOSIS — E1122 Type 2 diabetes mellitus with diabetic chronic kidney disease: Secondary | ICD-10-CM | POA: Diagnosis present

## 2016-03-13 DIAGNOSIS — Z89511 Acquired absence of right leg below knee: Secondary | ICD-10-CM

## 2016-03-13 DIAGNOSIS — D631 Anemia in chronic kidney disease: Secondary | ICD-10-CM | POA: Diagnosis present

## 2016-03-13 DIAGNOSIS — E119 Type 2 diabetes mellitus without complications: Secondary | ICD-10-CM

## 2016-03-13 DIAGNOSIS — E1143 Type 2 diabetes mellitus with diabetic autonomic (poly)neuropathy: Secondary | ICD-10-CM | POA: Diagnosis present

## 2016-03-13 DIAGNOSIS — Z7982 Long term (current) use of aspirin: Secondary | ICD-10-CM

## 2016-03-13 DIAGNOSIS — Z7902 Long term (current) use of antithrombotics/antiplatelets: Secondary | ICD-10-CM

## 2016-03-13 DIAGNOSIS — N2581 Secondary hyperparathyroidism of renal origin: Secondary | ICD-10-CM | POA: Diagnosis present

## 2016-03-13 DIAGNOSIS — Z79899 Other long term (current) drug therapy: Secondary | ICD-10-CM

## 2016-03-13 DIAGNOSIS — Z882 Allergy status to sulfonamides status: Secondary | ICD-10-CM

## 2016-03-13 DIAGNOSIS — I5032 Chronic diastolic (congestive) heart failure: Secondary | ICD-10-CM | POA: Diagnosis present

## 2016-03-13 DIAGNOSIS — I252 Old myocardial infarction: Secondary | ICD-10-CM | POA: Diagnosis not present

## 2016-03-13 DIAGNOSIS — I132 Hypertensive heart and chronic kidney disease with heart failure and with stage 5 chronic kidney disease, or end stage renal disease: Secondary | ICD-10-CM | POA: Diagnosis present

## 2016-03-13 DIAGNOSIS — Z880 Allergy status to penicillin: Secondary | ICD-10-CM

## 2016-03-13 DIAGNOSIS — Z992 Dependence on renal dialysis: Secondary | ICD-10-CM

## 2016-03-13 DIAGNOSIS — K449 Diaphragmatic hernia without obstruction or gangrene: Secondary | ICD-10-CM | POA: Diagnosis present

## 2016-03-13 DIAGNOSIS — Z8249 Family history of ischemic heart disease and other diseases of the circulatory system: Secondary | ICD-10-CM | POA: Diagnosis not present

## 2016-03-13 DIAGNOSIS — Z833 Family history of diabetes mellitus: Secondary | ICD-10-CM

## 2016-03-13 DIAGNOSIS — N186 End stage renal disease: Secondary | ICD-10-CM | POA: Diagnosis present

## 2016-03-13 DIAGNOSIS — Z955 Presence of coronary angioplasty implant and graft: Secondary | ICD-10-CM

## 2016-03-13 LAB — URINALYSIS, COMPLETE (UACMP) WITH MICROSCOPIC
Bilirubin Urine: NEGATIVE
Glucose, UA: 50 mg/dL — AB
HGB URINE DIPSTICK: NEGATIVE
Ketones, ur: NEGATIVE mg/dL
Nitrite: NEGATIVE
SQUAMOUS EPITHELIAL / LPF: NONE SEEN
Specific Gravity, Urine: 1.014 (ref 1.005–1.030)
pH: 5 (ref 5.0–8.0)

## 2016-03-13 LAB — COMPREHENSIVE METABOLIC PANEL
ALBUMIN: 2.4 g/dL — AB (ref 3.5–5.0)
ALK PHOS: 73 U/L (ref 38–126)
ALT: 8 U/L — ABNORMAL LOW (ref 14–54)
AST: 12 U/L — AB (ref 15–41)
Anion gap: 7 (ref 5–15)
BILIRUBIN TOTAL: 0.5 mg/dL (ref 0.3–1.2)
BUN: 32 mg/dL — AB (ref 6–20)
CALCIUM: 8.5 mg/dL — AB (ref 8.9–10.3)
CO2: 28 mmol/L (ref 22–32)
Chloride: 98 mmol/L — ABNORMAL LOW (ref 101–111)
Creatinine, Ser: 3.95 mg/dL — ABNORMAL HIGH (ref 0.44–1.00)
GFR calc Af Amer: 14 mL/min — ABNORMAL LOW (ref >60)
GFR, EST NON AFRICAN AMERICAN: 12 mL/min — AB (ref >60)
GLUCOSE: 118 mg/dL — AB (ref 65–99)
Potassium: 3.3 mmol/L — ABNORMAL LOW (ref 3.5–5.1)
Sodium: 133 mmol/L — ABNORMAL LOW (ref 135–145)
TOTAL PROTEIN: 5.7 g/dL — AB (ref 6.5–8.1)

## 2016-03-13 LAB — CBC WITH DIFFERENTIAL/PLATELET
BASOS ABS: 0 10*3/uL (ref 0–0.1)
BASOS PCT: 0 %
EOS ABS: 0.2 10*3/uL (ref 0–0.7)
EOS PCT: 3 %
HCT: 35.5 % (ref 35.0–47.0)
Hemoglobin: 11.4 g/dL — ABNORMAL LOW (ref 12.0–16.0)
Lymphocytes Relative: 19 %
Lymphs Abs: 1.1 10*3/uL (ref 1.0–3.6)
MCH: 27.3 pg (ref 26.0–34.0)
MCHC: 32.2 g/dL (ref 32.0–36.0)
MCV: 84.8 fL (ref 80.0–100.0)
MONOS PCT: 7 %
Monocytes Absolute: 0.5 10*3/uL (ref 0.2–0.9)
Neutro Abs: 4.4 10*3/uL (ref 1.4–6.5)
Neutrophils Relative %: 71 %
PLATELETS: 308 10*3/uL (ref 150–440)
RBC: 4.19 MIL/uL (ref 3.80–5.20)
RDW: 16.9 % — AB (ref 11.5–14.5)
WBC: 6.2 10*3/uL (ref 3.6–11.0)

## 2016-03-13 LAB — LIPASE, BLOOD: LIPASE: 15 U/L (ref 11–51)

## 2016-03-13 LAB — TROPONIN I
TROPONIN I: 0.04 ng/mL — AB (ref <0.03)
TROPONIN I: 0.04 ng/mL — AB (ref <0.03)

## 2016-03-13 LAB — GLUCOSE, CAPILLARY: GLUCOSE-CAPILLARY: 102 mg/dL — AB (ref 65–99)

## 2016-03-13 LAB — LACTIC ACID, PLASMA: Lactic Acid, Venous: 0.5 mmol/L (ref 0.5–1.9)

## 2016-03-13 MED ORDER — HEPARIN SODIUM (PORCINE) 5000 UNIT/ML IJ SOLN
5000.0000 [IU] | Freq: Three times a day (TID) | INTRAMUSCULAR | Status: DC
  Administered 2016-03-14 – 2016-03-16 (×7): 5000 [IU] via SUBCUTANEOUS
  Filled 2016-03-13 (×7): qty 1

## 2016-03-13 MED ORDER — ASPIRIN EC 81 MG PO TBEC
81.0000 mg | DELAYED_RELEASE_TABLET | Freq: Every day | ORAL | Status: DC
  Administered 2016-03-14 – 2016-03-16 (×3): 81 mg via ORAL
  Filled 2016-03-13 (×3): qty 1

## 2016-03-13 MED ORDER — SEVELAMER CARBONATE 800 MG PO TABS
800.0000 mg | ORAL_TABLET | Freq: Every day | ORAL | Status: DC
  Administered 2016-03-14 – 2016-03-15 (×3): 800 mg via ORAL
  Filled 2016-03-13 (×3): qty 1

## 2016-03-13 MED ORDER — TORSEMIDE 100 MG PO TABS
100.0000 mg | ORAL_TABLET | Freq: Every day | ORAL | Status: DC
  Administered 2016-03-14 – 2016-03-16 (×3): 100 mg via ORAL
  Filled 2016-03-13 (×3): qty 1

## 2016-03-13 MED ORDER — LEVETIRACETAM 500 MG PO TABS
500.0000 mg | ORAL_TABLET | Freq: Every morning | ORAL | Status: DC
  Administered 2016-03-14 – 2016-03-16 (×3): 500 mg via ORAL
  Filled 2016-03-13 (×3): qty 1

## 2016-03-13 MED ORDER — METOPROLOL TARTRATE 50 MG PO TABS
50.0000 mg | ORAL_TABLET | Freq: Once | ORAL | Status: AC
  Administered 2016-03-13: 50 mg via ORAL
  Filled 2016-03-13: qty 1

## 2016-03-13 MED ORDER — METRONIDAZOLE IN NACL 5-0.79 MG/ML-% IV SOLN
500.0000 mg | Freq: Once | INTRAVENOUS | Status: AC
  Administered 2016-03-14: 500 mg via INTRAVENOUS
  Filled 2016-03-13 (×3): qty 100

## 2016-03-13 MED ORDER — OXYCODONE-ACETAMINOPHEN 5-325 MG PO TABS
1.0000 | ORAL_TABLET | Freq: Four times a day (QID) | ORAL | Status: DC | PRN
  Administered 2016-03-14 – 2016-03-16 (×2): 1 via ORAL
  Filled 2016-03-13 (×2): qty 1

## 2016-03-13 MED ORDER — LEVOFLOXACIN IN D5W 750 MG/150ML IV SOLN: 750.0000 mg | INTRAVENOUS | Status: DC

## 2016-03-13 MED ORDER — BUPROPION HCL ER (XL) 150 MG PO TB24
150.0000 mg | ORAL_TABLET | Freq: Every day | ORAL | Status: DC
  Administered 2016-03-14 – 2016-03-16 (×3): 150 mg via ORAL
  Filled 2016-03-13 (×3): qty 1

## 2016-03-13 MED ORDER — FENTANYL CITRATE (PF) 100 MCG/2ML IJ SOLN
50.0000 ug | Freq: Once | INTRAMUSCULAR | Status: AC
  Administered 2016-03-13: 50 ug via INTRAVENOUS

## 2016-03-13 MED ORDER — HYDRALAZINE HCL 50 MG PO TABS
25.0000 mg | ORAL_TABLET | Freq: Every day | ORAL | Status: DC
Start: 2016-03-14 — End: 2016-03-15
  Administered 2016-03-14 – 2016-03-15 (×2): 25 mg via ORAL
  Filled 2016-03-13 (×2): qty 1

## 2016-03-13 MED ORDER — ACETAMINOPHEN 325 MG PO TABS
650.0000 mg | ORAL_TABLET | Freq: Four times a day (QID) | ORAL | Status: DC | PRN
  Administered 2016-03-15 – 2016-03-16 (×3): 650 mg via ORAL
  Filled 2016-03-13 (×5): qty 2

## 2016-03-13 MED ORDER — LABETALOL HCL 5 MG/ML IV SOLN
10.0000 mg | INTRAVENOUS | Status: DC | PRN
  Administered 2016-03-13 – 2016-03-15 (×3): 10 mg via INTRAVENOUS
  Filled 2016-03-13 (×3): qty 4

## 2016-03-13 MED ORDER — METOCLOPRAMIDE HCL 10 MG PO TABS
10.0000 mg | ORAL_TABLET | Freq: Four times a day (QID) | ORAL | Status: DC
  Administered 2016-03-14 – 2016-03-16 (×10): 10 mg via ORAL
  Filled 2016-03-13 (×10): qty 1

## 2016-03-13 MED ORDER — ONDANSETRON HCL 4 MG PO TABS: 4.0000 mg | ORAL_TABLET | Freq: Four times a day (QID) | ORAL | Status: DC | PRN

## 2016-03-13 MED ORDER — INSULIN ASPART 100 UNIT/ML ~~LOC~~ SOLN
0.0000 [IU] | Freq: Three times a day (TID) | SUBCUTANEOUS | Status: DC
  Administered 2016-03-15: 2 [IU] via SUBCUTANEOUS
  Administered 2016-03-15: 1 [IU] via SUBCUTANEOUS
  Administered 2016-03-15: 2 [IU] via SUBCUTANEOUS
  Filled 2016-03-13: qty 2
  Filled 2016-03-13: qty 1
  Filled 2016-03-13: qty 2

## 2016-03-13 MED ORDER — CALCIUM CARBONATE ANTACID 500 MG PO CHEW
1.0000 | CHEWABLE_TABLET | Freq: Three times a day (TID) | ORAL | Status: DC
  Administered 2016-03-14 – 2016-03-16 (×7): 200 mg via ORAL
  Filled 2016-03-13 (×7): qty 1

## 2016-03-13 MED ORDER — CLOPIDOGREL BISULFATE 75 MG PO TABS
75.0000 mg | ORAL_TABLET | Freq: Every day | ORAL | Status: DC
Start: 2016-03-14 — End: 2016-03-16
  Administered 2016-03-14 – 2016-03-16 (×3): 75 mg via ORAL
  Filled 2016-03-13 (×3): qty 1

## 2016-03-13 MED ORDER — PANTOPRAZOLE SODIUM 40 MG PO TBEC
40.0000 mg | DELAYED_RELEASE_TABLET | Freq: Two times a day (BID) | ORAL | Status: DC
  Administered 2016-03-14 – 2016-03-16 (×6): 40 mg via ORAL
  Filled 2016-03-13 (×6): qty 1

## 2016-03-13 MED ORDER — LISINOPRIL 10 MG PO TABS
10.0000 mg | ORAL_TABLET | Freq: Once | ORAL | Status: AC
  Administered 2016-03-13: 10 mg via ORAL
  Filled 2016-03-13: qty 1

## 2016-03-13 MED ORDER — IOPAMIDOL (ISOVUE-300) INJECTION 61%
30.0000 mL | Freq: Once | INTRAVENOUS | Status: AC
  Administered 2016-03-13: 30 mL via ORAL

## 2016-03-13 MED ORDER — CARVEDILOL 6.25 MG PO TABS
6.2500 mg | ORAL_TABLET | Freq: Two times a day (BID) | ORAL | Status: DC
  Administered 2016-03-14 – 2016-03-16 (×4): 6.25 mg via ORAL
  Filled 2016-03-13 (×4): qty 1

## 2016-03-13 MED ORDER — RISPERIDONE 1 MG PO TABS
1.0000 mg | ORAL_TABLET | Freq: Three times a day (TID) | ORAL | Status: DC
  Administered 2016-03-14 – 2016-03-16 (×9): 1 mg via ORAL
  Filled 2016-03-13 (×9): qty 1

## 2016-03-13 MED ORDER — FENTANYL CITRATE (PF) 100 MCG/2ML IJ SOLN
INTRAMUSCULAR | Status: AC
Start: 2016-03-13 — End: 2016-03-13
  Administered 2016-03-13: 50 ug via INTRAVENOUS
  Filled 2016-03-13: qty 2

## 2016-03-13 MED ORDER — SEVELAMER CARBONATE 800 MG PO TABS
800.0000 mg | ORAL_TABLET | Freq: Three times a day (TID) | ORAL | Status: DC
  Administered 2016-03-14 – 2016-03-16 (×7): 800 mg via ORAL
  Filled 2016-03-13 (×8): qty 1

## 2016-03-13 MED ORDER — HYDRALAZINE HCL 50 MG PO TABS
25.0000 mg | ORAL_TABLET | Freq: Once | ORAL | Status: AC
  Administered 2016-03-13: 25 mg via ORAL
  Filled 2016-03-13: qty 1

## 2016-03-13 MED ORDER — METRONIDAZOLE 500 MG PO TABS
500.0000 mg | ORAL_TABLET | Freq: Three times a day (TID) | ORAL | Status: DC
  Administered 2016-03-14 (×2): 500 mg via ORAL
  Filled 2016-03-13 (×3): qty 1

## 2016-03-13 MED ORDER — ONDANSETRON HCL 4 MG/2ML IJ SOLN: 4.0000 mg | Freq: Four times a day (QID) | INTRAMUSCULAR | Status: DC | PRN

## 2016-03-13 MED ORDER — AMLODIPINE BESYLATE 5 MG PO TABS: 10.0000 mg | ORAL_TABLET | Freq: Every day | ORAL | Status: DC

## 2016-03-13 MED ORDER — ONDANSETRON HCL 4 MG/2ML IJ SOLN
4.0000 mg | Freq: Once | INTRAMUSCULAR | Status: AC
  Administered 2016-03-13: 4 mg via INTRAVENOUS
  Filled 2016-03-13: qty 2

## 2016-03-13 MED ORDER — GABAPENTIN 300 MG PO CAPS
300.0000 mg | ORAL_CAPSULE | Freq: Three times a day (TID) | ORAL | Status: DC
  Administered 2016-03-14 – 2016-03-16 (×9): 300 mg via ORAL
  Filled 2016-03-13 (×9): qty 1

## 2016-03-13 MED ORDER — LEVOFLOXACIN IN D5W 750 MG/150ML IV SOLN
750.0000 mg | Freq: Once | INTRAVENOUS | Status: AC
  Administered 2016-03-13: 750 mg via INTRAVENOUS
  Filled 2016-03-13: qty 150

## 2016-03-13 MED ORDER — MORPHINE SULFATE (PF) 4 MG/ML IV SOLN: 2.0000 mg | INTRAVENOUS | Status: DC | PRN

## 2016-03-13 MED ORDER — ACETAMINOPHEN 650 MG RE SUPP: 650.0000 mg | Freq: Four times a day (QID) | RECTAL | Status: DC | PRN

## 2016-03-13 MED ORDER — LISINOPRIL 10 MG PO TABS
10.0000 mg | ORAL_TABLET | Freq: Every day | ORAL | Status: DC
  Administered 2016-03-14 – 2016-03-16 (×3): 10 mg via ORAL
  Filled 2016-03-13 (×3): qty 1

## 2016-03-13 MED ORDER — INSULIN ASPART 100 UNIT/ML ~~LOC~~ SOLN: 0.0000 [IU] | Freq: Every day | SUBCUTANEOUS | Status: DC

## 2016-03-13 MED ORDER — ISOSORBIDE MONONITRATE ER 60 MG PO TB24
90.0000 mg | ORAL_TABLET | Freq: Every day | ORAL | Status: DC
  Administered 2016-03-14 – 2016-03-16 (×3): 90 mg via ORAL
  Filled 2016-03-13 (×4): qty 2

## 2016-03-13 NOTE — Progress Notes (Signed)
ANTIBIOTIC CONSULT NOTE - INITIAL  Pharmacy Consult for Levaquin  Indication: intra-abdominal infection  Allergies  Allergen Reactions  . Cephalosporins Anaphylaxis    Patient has tolerated meropenem.   [name] Penicillins Anaphylaxis and Other (See Comments)    Has patient had a PCN reaction causing immediate rash, facial/tongue/throat swelling, SOB or lightheadedness with hypotension: Yes Has patient had a PCN reaction causing severe rash involving mucus membranes or skin necrosis: No Has patient had a PCN reaction that required hospitalization No Has patient had a PCN reaction occurring within the last 10 years: No If all of the above answers are "NO", then may proceed with Cephalosporin use.  . Lamictal [Lamotrigine] Other (See Comments)    Reaction:  Hallucinations  . Phenergan [Promethazine Hcl] Nausea And Vomiting  . Pravastatin Other (See Comments)    Reaction:  Muscle pain   . Sulfa Antibiotics Other (See Comments)    Reaction:  Unknown     Patient Measurements: Height: 5\' 9"  (175.3 cm) Weight: 246 lb (111.6 kg) IBW/kg (Calculated) : 66.2 Adjusted Body Weight:   Vital Signs: Temp: 98 F (36.7 C) (12/13 1536) Temp Source: Oral (12/13 1536) BP: 180/93 (12/13 2200) Pulse Rate: 74 (12/13 2200) Intake/Output from previous day: No intake/output data recorded. Intake/Output from this shift: Total I/O In: -  Out: 50 [Urine:50]  Labs:  Recent Labs  03/13/16 1612  WBC 6.2  HGB 11.4*  PLT 308  CREATININE 3.95*   Estimated Creatinine Clearance: 22.2 mL/min (by C-G formula based on SCr of 3.95 mg/dL (H)). No results for input(s): VANCOTROUGH, VANCOPEAK, VANCORANDOM, GENTTROUGH, GENTPEAK, GENTRANDOM, TOBRATROUGH, TOBRAPEAK, TOBRARND, AMIKACINPEAK, AMIKACINTROU, AMIKACIN in the last 72 hours.   Microbiology: Recent Results (from the past 720 hour(s))  MRSA PCR Screening     Status: Abnormal   Collection Time: 02/25/16  6:40 PM  Result Value Ref Range Status   MRSA by  PCR POSITIVE (A) NEGATIVE Final    Comment:        The GeneXpert MRSA Assay (FDA approved for NASAL specimens only), is one component of a comprehensive MRSA colonization surveillance program. It is not intended to diagnose MRSA infection nor to guide or monitor treatment for MRSA infections. RESULT CALLED TO, READ BACK BY AND VERIFIED WITH: MARSHA [facility] 02/25/16 @ 2118  MLK   Culture, blood (Routine X 2) w Reflex to ID Panel     Status: None   Collection Time: 02/25/16  7:26 PM  Result Value Ref Range Status   Specimen Description BLOOD RIGHT ANTECUBITAL  Final   Special Requests   Final    BOTTLES DRAWN AEROBIC AND ANAEROBIC 11 CC AEROBIC, 10 CC AEROBIC   Culture NO GROWTH 5 DAYS  Final   Report Status 03/01/2016 FINAL  Final  Culture, blood (Routine X 2) w Reflex to ID Panel     Status: None   Collection Time: 02/25/16  7:35 PM  Result Value Ref Range Status   Specimen Description BLOOD RIGHT HAND  Final   Special Requests   Final    BOTTLES DRAWN AEROBIC AND ANAEROBIC 4CC ANAEROBIC, 5CC AEROBIC   Culture NO GROWTH 5 DAYS  Final   Report Status 03/01/2016 FINAL  Final  Body fluid culture     Status: None   Collection Time: 02/26/16  9:54 AM  Result Value Ref Range Status   Specimen Description PERITONEAL  Final   Special Requests NONE  Final   Gram Stain   Final    MODERATE WBC PRESENT,BOTH  PMN AND MONONUCLEAR NO ORGANISMS SEEN    Culture   Final    NO GROWTH 3 DAYS [facility]    Report Status 02/29/2016 FINAL  Final  Body fluid culture     Status: None   Collection Time: 02/27/16 12:20 PM  Result Value Ref Range Status   Specimen Description HEMODIALYSIS CATHETER  Final   Special Requests NONE  Final   Gram Stain   Final    MODERATE WBC PRESENT, PREDOMINANTLY MONONUCLEAR NO ORGANISMS SEEN    Culture   Final    No growth aerobically or anaerobically. [facility]    Report Status 03/01/2016 FINAL  Final     Medical History: Past Medical History:  Diagnosis Date  . Anginal pain (HCC)   . Anxiety   . Asthma   . Bipolar disorder (HCC)   . CAD (coronary artery disease)   . CHF (congestive heart failure) (HCC)   . Chronic lower back pain   . Depression   . Endometriosis   . ESRD (end stage renal disease) on dialysis (HCC)    "[facility]; [address]; [place]; TTS" (01/19/2016)  . Gastroparesis   . GERD (gastroesophageal reflux disease)   . History of blood transfusion "several"   "my blood would get low; low RBC"  . History of hiatal hernia   . HLD (hyperlipidemia)   . Hypertension   . Migraine    "monthly" (01/19/2016)  . Myocardial infarction 2017   "~ 3 wks ago" (01/19/2016)  . Renal disorder   . Seizures (HCC) 07/2015   "I've only had the 1; don't know what from" (01/19/2016)  . Type II diabetes mellitus (HCC)     Medications:   (Not in a hospital admission) Assessment: CrCl = 22.2 ml/min  Goal of Therapy:  resolution of infection  Plan:  Expected duration 7 days with resolution of temperature and/or normalization of WBC  Levaquin 750 mg IV X 1 given on 12/13 @ 20:26. Levaquin 750 mg IV Q48H ordered to start on 12/15 @ 18:00.   Meric Joye D 03/13/2016,10:40 PM

## 2016-03-13 NOTE — ED Notes (Signed)
Patient had large BM runny and dark brown. Changed patient and put clean chux on bed

## 2016-03-13 NOTE — ED Triage Notes (Signed)
Pt in via EMS from dialysis; pt approximately 1.5 hours into 4 hours dialysis treatment when BP dropped to 70 systolic.  Dialysis reports systolic pressure 200 prior to treatment.  Pt A/Ox4, vitals WDL, no immediate distress noted at this time.

## 2016-03-13 NOTE — H&P (Signed)
[facility] [place] at [facility]   PATIENT NAME: Kimberly Montgomery    MR#:  [id number]  DATE OF BIRTH:  [birth year]  DATE OF ADMISSION:  03/13/2016  PRIMARY CARE PHYSICIAN: Jarome Matin, MD   REQUESTING/REFERRING PHYSICIAN: Darnelle Catalan, MD  CHIEF COMPLAINT:   Chief Complaint  Patient presents with  . Hypotension    HISTORY OF PRESENT ILLNESS:  Kimberly Montgomery  is a 50 y.o. female who presents with An episode of hypotension during hemodialysis today. Afterwards in the ED she is noted to be significantly hypertensive. She states that she used to do peritoneal dialysis, but has not been switched to hemodialysis. She also complains of abdominal pain for the past 2 weeks, which is been progressively getting worse. CT scan in the ED showed likely colitis. Hospitalists were called for admission and further evaluation and treatment.  PAST MEDICAL HISTORY:   Past Medical History:  Diagnosis Date  . Anginal pain (HCC)   . Anxiety   . Asthma   . Bipolar disorder (HCC)   . CAD (coronary artery disease)   . CHF (congestive heart failure) (HCC)   . Chronic lower back pain   . Depression   . Endometriosis   . ESRD (end stage renal disease) on dialysis (HCC)    "[facility]; [address]; [place]; TTS" (01/19/2016)  . Gastroparesis   . GERD (gastroesophageal reflux disease)   . History of blood transfusion "several"   "my blood would get low; low RBC"  . History of hiatal hernia   . HLD (hyperlipidemia)   . Hypertension   . Migraine    "monthly" (01/19/2016)  . Myocardial infarction 2017   "~ 3 wks ago" (01/19/2016)  . Renal disorder   . Seizures (HCC) 07/2015   "I've only had the 1; don't know what from" (01/19/2016)  . Type II diabetes mellitus (HCC)     PAST SURGICAL HISTORY:   Past Surgical History:  Procedure Laterality Date  . ABDOMINAL HYSTERECTOMY     "partial"  . BELOW KNEE LEG AMPUTATION Right 2010?  [name] CARDIAC CATHETERIZATION Right 07/10/2015    Procedure: Left Heart Cath and Coronary Angiography;  Surgeon: Laurier Nancy, MD;  Location: [facility] INVASIVE CV LAB;  Service: Cardiovascular;  Laterality: Right;  . CARDIAC CATHETERIZATION Right 09/14/2015   Procedure: Left Heart Cath and Coronary Angiography;  Surgeon: Laurier Nancy, MD;  Location: [facility] INVASIVE CV LAB;  Service: Cardiovascular;  Laterality: Right;  . CARDIAC CATHETERIZATION N/A 09/14/2015   Procedure: Coronary Stent Intervention;  Surgeon: Alwyn Pea, MD;  Location: [facility] INVASIVE CV LAB;  Service: Cardiovascular;  Laterality: N/A;  . CARDIAC CATHETERIZATION Right 11/20/2015   Procedure: Left Heart Cath and Coronary Angiography;  Surgeon: Laurier Nancy, MD;  Location: [facility] INVASIVE CV LAB;  Service: Cardiovascular;  Laterality: Right;  . CORONARY ANGIOPLASTY WITH STENT PLACEMENT  <2017   @ [facility]/notes 07/03/2013  . HYSTEROTOMY    . PERIPHERAL VASCULAR CATHETERIZATION N/A 08/30/2015   Procedure: Dialysis/Perma Catheter Insertion;  Surgeon: Annice Needy, MD;  Location: [facility] INVASIVE CV LAB;  Service: Cardiovascular;  Laterality: N/A;  . PERIPHERAL VASCULAR CATHETERIZATION N/A 01/01/2016   Procedure: Dialysis/Perma Catheter Insertion;  Surgeon: Annice Needy, MD;  Location: [facility] INVASIVE CV LAB;  Service: Cardiovascular;  Laterality: N/A;  . PERITONEAL CATHETER INSERTION  11/03/2013   [name] 11/03/2013  . PERITONEAL CATHETER REMOVAL  01/01/2016   "took the one from May out; put new PD cath in" (01/19/2016)  .  PERITONEAL CATHETER REMOVAL  11/03/2013; 02/11/2014   [name]/notes 11/03/2013; Removal of tunneled catheter/notes 02/11/2014  . SALPINGOOPHORECTOMY Right    [name]/notes 07/03/2013  . TUBAL LIGATION      SOCIAL HISTORY:   Social History  Substance Use Topics  . Smoking status: Former Smoker    Packs/day: 0.75    Types: Cigarettes    Quit date: 07/01/2015  . Smokeless tobacco: Never Used  . Alcohol use 0.0 oz/week     Comment: 01/19/2016 "might have a couple drinks/year"    FAMILY  HISTORY:   Family History  Problem Relation Age of Onset  . CAD    . Diabetes    . Bipolar disorder    . Cervical cancer Mother     DRUG ALLERGIES:   Allergies  Allergen Reactions  . Cephalosporins Anaphylaxis    Patient has tolerated meropenem.   [name]. Penicillins Anaphylaxis and Other (See Comments)    Has patient had a PCN reaction causing immediate rash, facial/tongue/throat swelling, SOB or lightheadedness with hypotension: Yes Has patient had a PCN reaction causing severe rash involving mucus membranes or skin necrosis: No Has patient had a PCN reaction that required hospitalization No Has patient had a PCN reaction occurring within the last 10 years: No If all of the above answers are "NO", then may proceed with Cephalosporin use.  . Lamictal [Lamotrigine] Other (See Comments)    Reaction:  Hallucinations  . Phenergan [Promethazine Hcl] Nausea And Vomiting  . Pravastatin Other (See Comments)    Reaction:  Muscle pain   . Sulfa Antibiotics Other (See Comments)    Reaction:  Unknown     MEDICATIONS AT HOME:   Prior to Admission medications   Medication Sig Start Date End Date Taking? Authorizing Provider  amLODipine (NORVASC) 10 MG tablet Take 10 mg by mouth daily.   Yes Historical Provider, MD  aspirin EC 81 MG tablet Take 81 mg by mouth daily.   Yes Historical Provider, MD  buPROPion (WELLBUTRIN XL) 150 MG 24 hr tablet Take 150 mg by mouth daily.   Yes Historical Provider, MD  calcium carbonate (TUMS - DOSED IN MG ELEMENTAL CALCIUM) 500 MG chewable tablet Chew 1 tablet (200 mg of elemental calcium total) by mouth 3 (three) times daily with meals. 08/02/15  Yes Adrian SaranSital Mody, MD  carvedilol (COREG) 6.25 MG tablet Take 6.25 mg by mouth 2 (two) times daily.   Yes Historical Provider, MD  clopidogrel (PLAVIX) 75 MG tablet Take 75 mg by mouth daily.   Yes Historical Provider, MD  fluticasone (FLONASE) 50 MCG/ACT nasal spray Place 1 spray into both nostrils daily.   Yes Historical  Provider, MD  gabapentin (NEURONTIN) 300 MG capsule Take 300 mg by mouth 3 (three) times daily.   Yes Historical Provider, MD  hydrALAZINE (APRESOLINE) 25 MG tablet Take 25 mg by mouth daily.    Yes Historical Provider, MD  insulin aspart (NOVOLOG) 100 UNIT/ML injection Inject 10 Units into the skin 3 (three) times daily with meals. Take 10 units with meals, additional 2 units sliding scale.   Yes Historical Provider, MD  insulin glargine (LANTUS) 100 UNIT/ML injection Inject 0.08 mLs (8 Units total) into the skin at bedtime. 08/30/15  Yes Srikar Sudini, MD  isosorbide mononitrate (IMDUR) 60 MG 24 hr tablet Take 1.5 tablets (90 mg total) by mouth daily. 01/20/16  Yes Vassie Lollarlos Madera, MD  lactulose [facility](CHRONULAC) 10 GM/15ML solution Take 10 g by mouth daily.    Yes Historical Provider, MD  lisinopril (  PRINIVIL,ZESTRIL) 10 MG tablet Take 1 tablet (10 mg total) by mouth daily. 01/21/16  Yes Vassie Loll, MD  metoCLOPramide (REGLAN) 10 MG tablet Take 10 mg by mouth 4 (four) times daily.   Yes Historical Provider, MD  metoprolol (LOPRESSOR) 50 MG tablet Take 1 tablet (50 mg total) by mouth 2 (two) times daily. 09/15/15  Yes Srikar Sudini, MD  nitroGLYCERIN (NITROSTAT) 0.4 MG SL tablet Place 0.4 mg under the tongue every 5 (five) minutes as needed for chest pain. Reported on 07/10/2015   Yes Historical Provider, MD  omeprazole (PRILOSEC) 20 MG capsule Take 20 mg by mouth 2 (two) times daily before a meal.    Yes Historical Provider, MD  ondansetron (ZOFRAN ODT) 4 MG disintegrating tablet Take 1 tablet (4 mg total) by mouth every 6 (six) hours as needed for nausea or vomiting. 07/16/15  Yes Sharyn Creamer, MD  oxyCODONE-acetaminophen (PERCOCET/ROXICET) 5-325 MG tablet Take one tablet by mouth every 6 hours as needed for moderate pain. Do not exceed 4gm of Tylenol in 24 hours 03/08/16  Yes Tiffany L Reed, DO  polyethylene glycol (MIRALAX / GLYCOLAX) packet Take 17 g by mouth daily. 08/02/15  Yes Adrian Saran, MD  risperiDONE  (RISPERDAL) 1 MG tablet Take 1 mg by mouth 3 (three) times daily.   Yes Historical Provider, MD  senna (SENOKOT) 8.6 MG TABS tablet Take 2 tablets by mouth 2 (two) times daily.    Yes Historical Provider, MD  sevelamer carbonate (RENVELA) 800 MG tablet Take 800 mg by mouth 4 (four) times daily. Take 3 times with meals and 1 with snacks.   Yes Historical Provider, MD  torsemide (DEMADEX) 100 MG tablet Take 100 mg by mouth daily.   Yes Historical Provider, MD  Amino Acids-Protein Hydrolys (FEEDING SUPPLEMENT, PRO-STAT SUGAR FREE 64,) LIQD Take 30 mLs by mouth 3 (three) times daily with meals.    Historical Provider, MD  levETIRAcetam (KEPPRA) 500 MG tablet Take 1 tablet (500 mg total) by mouth every morning. 08/02/15   Adrian Saran, MD  multivitamin (RENA-VIT) TABS tablet Take 1 tablet by mouth daily.    Historical Provider, MD  mupirocin ointment (BACTROBAN) 2 % Place 1 application into the nose 2 (two) times daily. 11/21/15   Katharina Caper, MD    REVIEW OF SYSTEMS:  Review of Systems  Constitutional: Negative for chills, fever, malaise/fatigue and weight loss.  HENT: Negative for ear pain, hearing loss and tinnitus.   Eyes: Negative for blurred vision, double vision, pain and redness.  Respiratory: Negative for cough, hemoptysis and shortness of breath.   Cardiovascular: Negative for chest pain, palpitations, orthopnea and leg swelling.  Gastrointestinal: Positive for abdominal pain, diarrhea and nausea. Negative for constipation and vomiting.  Genitourinary: Negative for dysuria, frequency and hematuria.  Musculoskeletal: Negative for back pain, joint pain and neck pain.  Skin:       No acne, rash, or lesions  Neurological: Negative for dizziness, tremors, focal weakness and weakness.  Endo/Heme/Allergies: Negative for polydipsia. Does not bruise/bleed easily.  Psychiatric/Behavioral: Negative for depression. The patient is not nervous/anxious and does not have insomnia.      VITAL SIGNS:    Vitals:   03/13/16 1801 03/13/16 1837 03/13/16 1900 03/13/16 1925  BP: (!) 194/80 (!) 191/83 (!) 137/112 (!) 212/103  Pulse: 79 79 83 86  Resp: (!) 25  20 12   Temp:      TempSrc:      SpO2: 97% 97% 99% 97%  Weight:  Height:       Wt Readings from Last 3 Encounters:  03/13/16 111.6 kg (246 lb)  03/03/16 111.6 kg (246 lb)  02/29/16 111.6 kg (246 lb)    PHYSICAL EXAMINATION:  Physical Exam  Vitals reviewed. Constitutional: She is oriented to person, place, and time. She appears well-developed and well-nourished. No distress.  HENT:  Head: Normocephalic and atraumatic.  Mouth/Throat: Oropharynx is clear and moist.  Eyes: Conjunctivae and EOM are normal. Pupils are equal, round, and reactive to light. No scleral icterus.  Neck: Normal range of motion. Neck supple. No JVD present. No thyromegaly present.  Cardiovascular: Normal rate, regular rhythm and intact distal pulses.  Exam reveals no gallop and no friction rub.   No murmur heard. Respiratory: Effort normal and breath sounds normal. No respiratory distress. She has no wheezes. She has no rales.  GI: Soft. She exhibits no distension. There is tenderness.  Hyperactive bowel sounds  Musculoskeletal: Normal range of motion. She exhibits no edema.  No arthritis, no gout  Lymphadenopathy:    She has no cervical adenopathy.  Neurological: She is alert and oriented to person, place, and time. No cranial nerve deficit.  No dysarthria, no aphasia  Skin: Skin is warm and dry. No rash noted. No erythema.  Psychiatric: She has a normal mood and affect. Her behavior is normal. Judgment and thought content normal.    LABORATORY PANEL:   CBC  Recent Labs Lab 03/13/16 1612  WBC 6.2  HGB 11.4*  HCT 35.5  PLT 308   ------------------------------------------------------------------------------------------------------------------  Chemistries   Recent Labs Lab 03/13/16 1612  NA 133*  K 3.3*  CL 98*  CO2 28  GLUCOSE  118*  BUN 32*  CREATININE 3.95*  CALCIUM 8.5*  AST 12*  ALT 8*  ALKPHOS 73  BILITOT 0.5   ------------------------------------------------------------------------------------------------------------------  Cardiac Enzymes  Recent Labs Lab 03/13/16 1921  TROPONINI 0.04*   ------------------------------------------------------------------------------------------------------------------  RADIOLOGY:  Ct Abdomen Pelvis Wo Contrast  Result Date: 03/13/2016 CLINICAL DATA:  50 year old female with a history of lower abdominal pain for 1 day EXAM: CT ABDOMEN AND PELVIS WITHOUT CONTRAST TECHNIQUE: Multidetector CT imaging of the abdomen and pelvis was performed following the standard protocol without IV contrast. COMPARISON:  Chest x-ray 03/13/2016, CT 07/15/2015 FINDINGS: Lower chest: Small left pleural effusion with associated atelectasis. Calcifications of native coronary vasculature Hepatobiliary: No focal liver abnormality is seen. No gallstones, gallbladder wall thickening, or biliary dilatation.Unremarkable gallbladder. Pancreas: Atrophic pancreatic parenchyma. Spleen: Normal in size without focal abnormality. Adrenals/Urinary Tract: Right adrenal gland unremarkable.  Left adrenal gland unremarkable. Right kidney: No right-sided hydronephrosis.  Vascular calcifications. Left Kidney: No left-sided hydronephrosis or nephrolithiasis. No significant stranding. Course of the bilateral ureters unremarkable. Unremarkable appearance of the urinary bladder. Stomach/Bowel: Stomach partially distended with enteric contrast. Small hiatal hernia. Unremarkable appearance of small bowel. No abnormally distended small bowel. Normal appendix, although high density contents within the lumen. No associated inflammation. Circumferential wall thickening of the distal sigmoid colon and rectum extending nearly to the anal verge. There is associated infiltration/ edema within the adjacent fat. Small volume formed stool  within the rectum. More proximal colon is relatively decompressed without evidence of transition point or obstruction. Colonic diverticular present without changes to suggest acute diverticulitis. Vascular/Lymphatic: Dense calcifications of the abdominal aorta and the mesenteric vessels. Calcifications extend into the iliac vessels, proximal femoral vessels, and the pelvic vasculature. There has been interval development of borderline enlarged lymph nodes in the lymphatic drainage pathway of the rectum  through the IMA/IMV vasculature. Largest index lymph node at the level of the sacral base, new from the prior, measuring 12 mm. Reproductive: Gas within the vagina extending to the cervix with complex debris of unknown etiology. Unremarkable uterus and adnexa. Other: Extensive infiltration of the body wall with edema/ anasarca. Interval removal of the peritoneal dialysis catheter. High density material anterior to the right abdominal wall musculature measuring 60- 70 Hounsfield units. Musculoskeletal: Degenerative changes of the visualized spine. Endplate irregularity at inferior L3, superior L4, which was present on the comparison CT. Most likely these represent Schmorl nodes. Degenerative changes of the bilateral hips. IMPRESSION: Circumferential inflammatory changes/ thickening of the rectum, with associated edema/ inflammation of the adjacent fat. Findings are most suggestive of colitis, potentially inflammatory/infectious etiology. Recommend correlation with patient presentation and lab values. Compared to the CT of April 2017, there is new lymphadenopathy in the lymphatic drainage of the rectum, following the IMV/IMA vasculature. Index lymph node at the sacral base measures 12 mm. While this is most likely reactive inflammatory adenopathy, referral for colonoscopy is recommended, as an occult colon cancer could have this appearance/presentation. No evidence of colonic obstruction or fecal impaction. Extensive  body wall anasarca/ edema. Recommend correlation with patient's fluid status. There is associated small left basilar pleural effusion. Interval removal of peritoneal dialysis catheter since the prior CT. High density material within the subcutaneous fat overlying the right abdominal wall may represent blood products. Gas/ debris within the vagina extending to the cervix, of uncertain etiology. A colo-vaginal fistula is unlikely, although could have this presentation, and correlation with patient presentation may be useful. Aortic atherosclerosis.  Associated coronary artery disease. Signed, Yvone Neu. Loreta Ave, DO [facility] Electronically Signed   By: Gilmer Mor D.O.   On: 03/13/2016 19:21   Dg Chest Portable 1 View  Result Date: 03/13/2016 CLINICAL DATA:  Acute hypotensive episode today after dialysis. EXAM: PORTABLE CHEST 1 VIEW COMPARISON:  03/03/2016 FINDINGS: Heart size remains at the upper limits of normal. Left jugular dual-lumen central venous catheter remains in appropriate position. No evidence of pneumothorax. No evidence of congestive heart failure. Opacity is seen in the left retrocardiac lung base silhouetting the left hemidiaphragm, which may be due to atelectasis or infiltrate. IMPRESSION: Retrocardiac left lower lobe atelectasis versus infiltrate. Electronically Signed   By: Myles Rosenthal M.D.   On: 03/13/2016 16:58    EKG:   Orders placed or performed during the hospital encounter of 03/13/16  . EKG 12-Lead  . EKG 12-Lead  . ED EKG  . ED EKG  . EKG 12-Lead  . EKG 12-Lead    IMPRESSION AND PLAN:  Principal Problem:   Colitis - likely etiology for her abdominal discomfort and diarrhea. IV antibiotics started in the ED and continued on admission. White blood cell count is within normal limits. Active Problems:   Accelerated hypertension - home medications given in the ED without much help, when necessary additional  antihypertensives ordered. Blood pressure goal less than 160/100. Troponin very mildly elevated. In the setting of end-stage renal disease on dialysis, suspect this may just be a standard baseline troponin leak, however we will trend her troponins tonight.   Type 2 diabetes mellitus (HCC) - renal/carb modified diet, sliding scale insulin with appropriate glucose checks   CAD (coronary artery disease) - continue home meds, trend troponins as above. No active chest pain.   ESRD on dialysis [facility]) - nephrology consult for dialysis assistance   Chronic diastolic  congestive heart failure (HCC) - continue home meds   GERD (gastroesophageal reflux disease) - home dose PPI   Seizures (HCC) - home dose antiepileptics  All the records are reviewed and case discussed with ED provider. Management plans discussed with the patient and/or family.  DVT PROPHYLAXIS: SubQ heparin  GI PROPHYLAXIS: PPI  ADMISSION STATUS: Inpatient  CODE STATUS: Full Code Status History    Date Active Date Inactive Code Status Order ID Comments User Context   02/25/2016  5:58 PM 02/27/2016 11:32 PM Full Code [id number]  Shaune Pollack, MD Inpatient   01/19/2016  4:54 AM 01/20/2016  9:03 PM Full Code [id number]  Hillary Bow, DO ED   12/25/2015  8:01 PM 01/04/2016  8:24 PM Full Code [id number]  Houston Siren, MD Inpatient   11/19/2015  1:17 AM 11/21/2015  7:56 PM Full Code [id number]  Tonye Royalty, DO Inpatient   10/06/2015  2:08 AM 10/06/2015  6:32 PM Full Code [id number]  Ihor Austin, MD Inpatient   09/13/2015  2:14 AM 09/15/2015  5:28 PM Full Code [id number]  Arnaldo Natal, MD Inpatient   08/24/2015  3:50 AM 08/31/2015  6:47 PM Full Code [id number]  Ihor Austin, MD Inpatient   07/16/2015  6:26 AM 08/02/2015  6:48 PM Full Code [id number]  Ihor Austin, MD ED   07/10/2015  1:49 AM 07/12/2015  1:01 PM Full Code [id number]  Oralia Manis, MD Inpatient   05/12/2015  3:46 PM 05/16/2015  5:35 PM Full Code [id number]  Auburn Bilberry, MD ED       TOTAL TIME TAKING CARE OF THIS PATIENT: 45 minutes.    [place] FIELDING 03/13/2016, 8:54 PM  [facility]  Office  [phone]  CC: Primary care physician; Jarome Matin, MD

## 2016-03-13 NOTE — ED Provider Notes (Signed)
[facility] Emergency Department Provider Note   ____________________________________________   First MD Initiated Contact with Patient 03/13/16 1548     (approximate)  I have reviewed the triage vital signs and the nursing notes.   HISTORY  Chief Complaint Hypotension   HPI Kimberly Montgomery is a 50 y.o. female patient sent from dialysis for episode of hypotension she had had a blood pressure) in the 200s went to dialysis and in the middle dialysis drop her pressure to 70. She was sent here. She reports she's had abdominal pain for weeks. Seems to be getting somewhat worse. She's also had chronic and intermittent headaches and word finding difficulty for quite some time.  Past Medical History:  Diagnosis Date  . Anginal pain (HCC)   . Anxiety   . Asthma   . Bipolar disorder (HCC)   . CAD (coronary artery disease)   . CHF (congestive heart failure) (HCC)   . Chronic lower back pain   . Depression   . Endometriosis   . ESRD (end stage renal disease) on dialysis (HCC)    "[facility]; [address]; [place]; TTS" (01/19/2016)  . Gastroparesis   . GERD (gastroesophageal reflux disease)   . History of blood transfusion "several"   "my blood would get low; low RBC"  . History of hiatal hernia   . HLD (hyperlipidemia)   . Hypertension   . Migraine    "monthly" (01/19/2016)  . Myocardial infarction 2017   "~ 3 wks ago" (01/19/2016)  . Renal disorder   . Seizures (HCC) 07/2015   "I've only had the 1; don't know what from" (01/19/2016)  . Type II diabetes mellitus [facility])     Patient Active Problem List   Diagnosis Date Noted  . Chronic diastolic congestive heart failure (HCC) 01/22/2016  . Pressure injury of skin 01/20/2016  . Bacteremia 12/27/2015  . Essential hypertension 11/21/2015  . Hyperlipidemia 11/21/2015  . Anemia of chronic disease 11/21/2015  . Acute respiratory failure with hypoxia (HCC) 11/21/2015  . Acute diastolic CHF (congestive  heart failure) (HCC) 11/21/2015  . ESRD on dialysis (HCC) 11/21/2015  . 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 NSTEMI (non-ST elevated myocardial infarction) (HCC) 11/20/2015  . Chest pain, rule out acute myocardial infarction 11/18/2015  . Bipolar I disorder, most recent episode depressed (HCC)   . Altered mental status 08/24/2015  . ESRD (end stage renal disease) (HCC)   . Ileus (HCC)   . Bipolar I disorder (HCC) 07/25/2015  . Seizures (HCC) 07/25/2015  . Peritonitis (HCC) 07/16/2015  . Unstable angina (HCC) 07/09/2015  . ESRD on peritoneal dialysis (HCC) 07/09/2015  . Accelerated hypertension 07/09/2015  . Type 2 diabetes mellitus (HCC) 07/09/2015  . CAD (coronary artery disease) 07/09/2015  . HLD (hyperlipidemia) 07/09/2015  . GERD (gastroesophageal reflux disease) 07/09/2015    Past Surgical History:  Procedure Laterality Date  . ABDOMINAL HYSTERECTOMY     "partial"  . BELOW KNEE LEG AMPUTATION Right 2010?  [name] CARDIAC CATHETERIZATION Right 07/10/2015   Procedure: Left Heart Cath and Coronary Angiography;  Surgeon: Laurier Nancy, MD;  Location: [facility] INVASIVE CV LAB;  Service: Cardiovascular;  Laterality: Right;  . CARDIAC CATHETERIZATION Right 09/14/2015   Procedure: Left Heart Cath and Coronary Angiography;  Surgeon: Laurier Nancy, MD;  Location: [facility] INVASIVE CV LAB;  Service: Cardiovascular;  Laterality: Right;  . CARDIAC CATHETERIZATION N/A 09/14/2015   Procedure: Coronary Stent Intervention;  Surgeon: Alwyn Pea, MD;  Location: [facility] INVASIVE CV LAB;  Service: Cardiovascular;  Laterality: N/A;  . CARDIAC CATHETERIZATION Right 11/20/2015   Procedure: Left Heart Cath and Coronary Angiography;  Surgeon: Laurier Nancy, MD;  Location: [facility] INVASIVE CV LAB;  Service: Cardiovascular;  Laterality: Right;  . CORONARY ANGIOPLASTY WITH STENT PLACEMENT  <2017   @ [facility]/notes 07/03/2013  . HYSTEROTOMY    . PERIPHERAL VASCULAR CATHETERIZATION N/A 08/30/2015   Procedure: Dialysis/Perma Catheter  Insertion;  Surgeon: Annice Needy, MD;  Location: [facility] INVASIVE CV LAB;  Service: Cardiovascular;  Laterality: N/A;  . PERIPHERAL VASCULAR CATHETERIZATION N/A 01/01/2016   Procedure: Dialysis/Perma Catheter Insertion;  Surgeon: Annice Needy, MD;  Location: [facility] INVASIVE CV LAB;  Service: Cardiovascular;  Laterality: N/A;  . PERITONEAL CATHETER INSERTION  11/03/2013   [name] 11/03/2013  . PERITONEAL CATHETER REMOVAL  01/01/2016   "took the one from May out; put new PD cath in" (01/19/2016)  . PERITONEAL CATHETER REMOVAL  11/03/2013; 02/11/2014   [name] 11/03/2013; Removal of tunneled catheter/notes 02/11/2014  . SALPINGOOPHORECTOMY Right    [name] 07/03/2013  . TUBAL LIGATION      Prior to Admission medications   Medication Sig Start Date End Date Taking? Authorizing Provider  amLODipine (NORVASC) 10 MG tablet Take 10 mg by mouth daily.   Yes Historical Provider, MD  aspirin EC 81 MG tablet Take 81 mg by mouth daily.   Yes Historical Provider, MD  buPROPion (WELLBUTRIN XL) 150 MG 24 hr tablet Take 150 mg by mouth daily.   Yes Historical Provider, MD  calcium carbonate (TUMS - DOSED IN MG ELEMENTAL CALCIUM) 500 MG chewable tablet Chew 1 tablet (200 mg of elemental calcium total) by mouth 3 (three) times daily with meals. 08/02/15  Yes Adrian Saran, MD  carvedilol (COREG) 6.25 MG tablet Take 6.25 mg by mouth 2 (two) times daily.   Yes Historical Provider, MD  clopidogrel (PLAVIX) 75 MG tablet Take 75 mg by mouth daily.   Yes Historical Provider, MD  fluticasone (FLONASE) 50 MCG/ACT nasal spray Place 1 spray into both nostrils daily.   Yes Historical Provider, MD  gabapentin (NEURONTIN) 300 MG capsule Take 300 mg by mouth 3 (three) times daily.   Yes Historical Provider, MD  hydrALAZINE (APRESOLINE) 25 MG tablet Take 25 mg by mouth daily.    Yes Historical Provider, MD  insulin aspart (NOVOLOG) 100 UNIT/ML injection Inject 10 Units into the skin 3 (three) times daily with meals. Take 10 units with meals,  additional 2 units sliding scale.   Yes Historical Provider, MD  insulin glargine (LANTUS) 100 UNIT/ML injection Inject 0.08 mLs (8 Units total) into the skin at bedtime. 08/30/15  Yes Srikar Sudini, MD  isosorbide mononitrate (IMDUR) 60 MG 24 hr tablet Take 1.5 tablets (90 mg total) by mouth daily. 01/20/16  Yes Vassie Loll, MD  lactulose [facility]) 10 GM/15ML solution Take 10 g by mouth daily.    Yes Historical Provider, MD  lisinopril (PRINIVIL,ZESTRIL) 10 MG tablet Take 1 tablet (10 mg total) by mouth daily. 01/21/16  Yes Vassie Loll, MD  metoCLOPramide (REGLAN) 10 MG tablet Take 10 mg by mouth 4 (four) times daily.   Yes Historical Provider, MD  metoprolol (LOPRESSOR) 50 MG tablet Take 1 tablet (50 mg total) by mouth 2 (two) times daily. 09/15/15  Yes Srikar Sudini, MD  nitroGLYCERIN (NITROSTAT) 0.4 MG SL tablet Place 0.4 mg under the tongue every 5 (five) minutes as needed for chest pain. Reported on 07/10/2015   Yes Historical Provider, MD  omeprazole (PRILOSEC) 20 MG  capsule Take 20 mg by mouth 2 (two) times daily before a meal.    Yes Historical Provider, MD  ondansetron (ZOFRAN ODT) 4 MG disintegrating tablet Take 1 tablet (4 mg total) by mouth every 6 (six) hours as needed for nausea or vomiting. 07/16/15  Yes Sharyn Creamer, MD  oxyCODONE-acetaminophen (PERCOCET/ROXICET) 5-325 MG tablet Take one tablet by mouth every 6 hours as needed for moderate pain. Do not exceed 4gm of Tylenol in 24 hours 03/08/16  Yes Tiffany L Reed, DO  polyethylene glycol (MIRALAX / GLYCOLAX) packet Take 17 g by mouth daily. 08/02/15  Yes Adrian Saran, MD  risperiDONE (RISPERDAL) 1 MG tablet Take 1 mg by mouth 3 (three) times daily.   Yes Historical Provider, MD  senna (SENOKOT) 8.6 MG TABS tablet Take 2 tablets by mouth 2 (two) times daily.    Yes Historical Provider, MD  sevelamer carbonate (RENVELA) 800 MG tablet Take 800 mg by mouth 4 (four) times daily. Take 3 times with meals and 1 with snacks.   Yes Historical  Provider, MD  torsemide (DEMADEX) 100 MG tablet Take 100 mg by mouth daily.   Yes Historical Provider, MD  Amino Acids-Protein Hydrolys (FEEDING SUPPLEMENT, PRO-STAT SUGAR FREE 64,) LIQD Take 30 mLs by mouth 3 (three) times daily with meals.    Historical Provider, MD  levETIRAcetam (KEPPRA) 500 MG tablet Take 1 tablet (500 mg total) by mouth every morning. 08/02/15   Adrian Saran, MD  multivitamin (RENA-VIT) TABS tablet Take 1 tablet by mouth daily.    Historical Provider, MD  mupirocin ointment (BACTROBAN) 2 % Place 1 application into the nose 2 (two) times daily. 11/21/15   Katharina Caper, MD    Allergies Cephalosporins; Penicillins; Lamictal [lamotrigine]; Phenergan [promethazine hcl]; Pravastatin; and Sulfa antibiotics  Family History  Problem Relation Age of Onset  . CAD    . Diabetes    . Bipolar disorder    . Cervical cancer Mother     Social History Social History  Substance Use Topics  . Smoking status: Former Smoker    Packs/day: 0.75    Types: Cigarettes    Quit date: 07/01/2015  . Smokeless tobacco: Never Used  . Alcohol use 0.0 oz/week     Comment: 01/19/2016 "might have a couple drinks/year"    Review of Systems Constitutional: No fever/chills Eyes: No visual changes. ENT: No sore throat. Cardiovascular: Denies chest pain. Respiratory: Denies shortness of breath. Gastrointestinal:See history of present illness Genitourinary: Negative for dysuria. Musculoskeletal: Negative for back pain. Skin: Negative for rash. Neurological: Negative for headaches, focal weakness or numbness.  10-point ROS otherwise negative.  ____________________________________________   PHYSICAL EXAM:  VITAL SIGNS: ED Triage Vitals  Enc Vitals Group     BP 03/13/16 1533 (!) 172/75     Pulse Rate 03/13/16 1533 79     Resp 03/13/16 1536 14     Temp 03/13/16 1536 98 F (36.7 C)     Temp Source 03/13/16 1536 Oral     SpO2 03/13/16 1533 95 %     Weight 03/13/16 1537 246 lb (111.6 kg)       Height 03/13/16 1537 5\' 9"  (1.753 m)     Head Circumference --      Peak Flow --      Pain Score 03/13/16 1537 4     Pain Loc --      Pain Edu? --      Excl. in GC? --     Constitutional: Alert and oriented.  Well appearing and in no acute distress. Eyes: Conjunctivae are normal. PERRL. EOMI. Head: Atraumatic. Nose: No congestion/rhinnorhea. Mouth/Throat: Mucous membranes are moist.  Oropharynx non-erythematous. Neck: No stridor.  Cardiovascular: Normal rate, regular rhythm. Grossly normal heart sounds.  Good peripheral circulation. Respiratory: Normal respiratory effort.  No retractions. Lungs CTAB. Gastrointestinal: Soft Diffusely tender  distention. No abdominal bruits. No CVA tenderness. Musculoskeletal: No lower extremity tenderness  No joint effusions.       ____________________________________________   LABS (all labs ordered are listed, but only abnormal results are displayed)  Labs Reviewed  COMPREHENSIVE METABOLIC PANEL - Abnormal; Notable for the following:       Result Value   Sodium 133 (*)    Potassium 3.3 (*)    Chloride 98 (*)    Glucose, Bld 118 (*)    BUN 32 (*)    Creatinine, Ser 3.95 (*)    Calcium 8.5 (*)    Total Protein 5.7 (*)    Albumin 2.4 (*)    AST 12 (*)    ALT 8 (*)    GFR calc non Af Amer 12 (*)    GFR calc Af Amer 14 (*)    All other components within normal limits  TROPONIN I - Abnormal; Notable for the following:    Troponin I 0.04 (*)    All other components within normal limits  CBC WITH DIFFERENTIAL/PLATELET - Abnormal; Notable for the following:    Hemoglobin 11.4 (*)    RDW 16.9 (*)    All other components within normal limits  URINALYSIS, COMPLETE (UACMP) WITH MICROSCOPIC - Abnormal; Notable for the following:    Color, Urine YELLOW (*)    APPearance HAZY (*)    Glucose, UA 50 (*)    Protein, ur >=300 (*)    Leukocytes, UA TRACE (*)    Bacteria, UA RARE (*)    All other components within normal limits  LACTIC ACID,  PLASMA  LIPASE, BLOOD  TROPONIN I   ____________________________________________  EKG  EKG read and interpreted by me shows normal sinus rhythm rate of 77 right axis some T-wave flattening and slight inversion in V6 and V5 ____________________________________________  RADIOLOGY Study Result   CLINICAL DATA:  50 year old female with a history of lower abdominal pain for 1 day  EXAM: CT ABDOMEN AND PELVIS WITHOUT CONTRAST  TECHNIQUE: Multidetector CT imaging of the abdomen and pelvis was performed following the standard protocol without IV contrast.  COMPARISON:  Chest x-ray 03/13/2016, CT 07/15/2015  FINDINGS: Lower chest: Small left pleural effusion with associated atelectasis.  Calcifications of native coronary vasculature  Hepatobiliary: No focal liver abnormality is seen. No gallstones, gallbladder wall thickening, or biliary dilatation.Unremarkable gallbladder.  Pancreas: Atrophic pancreatic parenchyma.  Spleen: Normal in size without focal abnormality.  Adrenals/Urinary Tract:  Right adrenal gland unremarkable.  Left adrenal gland unremarkable.  Right kidney:  No right-sided hydronephrosis.  Vascular calcifications.  Left Kidney: No left-sided hydronephrosis or nephrolithiasis.  No significant stranding.  Course of the bilateral ureters unremarkable.  Unremarkable appearance of the urinary bladder.  Stomach/Bowel: Stomach partially distended with enteric contrast. Small hiatal hernia. Unremarkable appearance of small bowel. No abnormally distended small bowel.  Normal appendix, although high density contents within the lumen. No associated inflammation.  Circumferential wall thickening of the distal sigmoid colon and rectum extending nearly to the anal verge. There is associated infiltration/ edema within the adjacent fat. Small volume formed stool within the rectum. More proximal colon is relatively decompressed without  evidence  of transition point or obstruction. Colonic diverticular present without changes to suggest acute diverticulitis.  Vascular/Lymphatic: Dense calcifications of the abdominal aorta and the mesenteric vessels. Calcifications extend into the iliac vessels, proximal femoral vessels, and the pelvic vasculature.  There has been interval development of borderline enlarged lymph nodes in the lymphatic drainage pathway of the rectum through the IMA/IMV vasculature. Largest index lymph node at the level of the sacral base, new from the prior, measuring 12 mm.  Reproductive: Gas within the vagina extending to the cervix with complex debris of unknown etiology. Unremarkable uterus and adnexa.  Other: Extensive infiltration of the body wall with edema/ anasarca.  Interval removal of the peritoneal dialysis catheter.  High density material anterior to the right abdominal wall musculature measuring 60- 70 Hounsfield units.  Musculoskeletal: Degenerative changes of the visualized spine. Endplate irregularity at inferior L3, superior L4, which was present on the comparison CT. Most likely these represent Schmorl nodes.  Degenerative changes of the bilateral hips.  IMPRESSION: Circumferential inflammatory changes/ thickening of the rectum, with associated edema/ inflammation of the adjacent fat. Findings are most suggestive of colitis, potentially inflammatory/infectious etiology. Recommend correlation with patient presentation and lab values.  Compared to the CT of April 2017, there is new lymphadenopathy in the lymphatic drainage of the rectum, following the IMV/IMA vasculature. Index lymph node at the sacral base measures 12 mm. While this is most likely reactive inflammatory adenopathy, referral for colonoscopy is recommended, as an occult colon cancer could have this appearance/presentation.  No evidence of colonic obstruction or fecal impaction.  Extensive body  wall anasarca/ edema. Recommend correlation with patient's fluid status. There is associated small left basilar pleural effusion.  Interval removal of peritoneal dialysis catheter since the prior CT. High density material within the subcutaneous fat overlying the right abdominal wall may represent blood products.  Gas/ debris within the vagina extending to the cervix, of uncertain etiology. A colo-vaginal fistula is unlikely, although could have this presentation, and correlation with patient presentation may be useful.  Aortic atherosclerosis.  Associated coronary artery disease.  Signed,  Yvone NeuJaime S. Loreta AveWagner, DO  [facility]  [facility]   Electronically Signed   By: Gilmer MorJaime  Wagner D.O.   On: 03/13/2016 19:21     ____________________________________________   PROCEDURES  Procedure(s) performed:   Procedures  Critical Care performed:  ____________________________________________   INITIAL IMPRESSION / ASSESSMENT AND PLAN / ED COURSE  Pertinent labs & imaging results that were available during my care of the patient were reviewed by me and considered in my medical decision making (see chart for details).  No vaginal discharge  Clinical Course      ____________________________________________   FINAL CLINICAL IMPRESSION(S) / ED DIAGNOSES  Final diagnoses:  Colitis  Anasarca      NEW MEDICATIONS STARTED DURING THIS VISIT:  New Prescriptions   No medications on file     Note:  This document was prepared using Dragon voice recognition software and may include unintentional dictation errors.    Arnaldo NatalPaul F Malinda, MD 03/13/16 2012

## 2016-03-13 NOTE — ED Notes (Signed)
Patient transported to CT 

## 2016-03-13 NOTE — ED Notes (Signed)
Called [facility] to verify pt's blood pressure medication(s).  Spoke with [employer], LPN to verify these medications.  See new orders.

## 2016-03-13 NOTE — Progress Notes (Deleted)
Cardiology Office Note   Date:  03/13/2016   ID:  Kimberly Montgomery, Kimberly Montgomery [birth year], MRN [id number]  PCP:  Jarome Matin, MD  Cardiologist:  Dr Welton Flakes in [place], [place], [place]   No chief complaint on file.   History of Present Illness: Kimberly Montgomery is a 50 y.o. female with a history of  DES to mid LAD in setting of NSTEMI in June 2017, also with history of stenting to the RCA, ESRD on HD, CHF, bipolar disorder, GERD, and DM  10/2015 s/p cath w/ troponin 0.27, Cath showed widely patent LAD stent with moderate disease in the distal LAD, as well as the moderate disease in the first OM. This was treated medically.  01/19/2016, Dr Clifton James saw in-hospital for CP, felt demand ischemia as it usually occurs after HD. Imdur increased.   Kimberly Montgomery presents for ***   Past Medical History:  Diagnosis Date  . Anginal pain (HCC)   . Anxiety   . Asthma   . Bipolar disorder (HCC)   . CAD (coronary artery disease)   . CHF (congestive heart failure) (HCC)   . Chronic lower back pain   . Depression   . Endometriosis   . ESRD (end stage renal disease) on dialysis (HCC)    "[facility]; [address]; [place]; TTS" (01/19/2016)  . Gastroparesis   . GERD (gastroesophageal reflux disease)   . History of blood transfusion "several"   "my blood would get low; low RBC"  . History of hiatal hernia   . HLD (hyperlipidemia)   . Hypertension   . Migraine    "monthly" (01/19/2016)  . Myocardial infarction 2017   "~ 3 wks ago" (01/19/2016)  . Renal disorder   . Seizures (HCC) 07/2015   "I've only had the 1; don't know what from" (01/19/2016)  . Type II diabetes mellitus (HCC)     Past Surgical History:  Procedure Laterality Date  . ABDOMINAL HYSTERECTOMY     "partial"  . BELOW KNEE LEG AMPUTATION Right 2010?  [name] CARDIAC CATHETERIZATION Right 07/10/2015   Procedure: Left Heart Cath and Coronary Angiography;  Surgeon: Laurier Nancy, MD;  Location: [facility] INVASIVE CV LAB;   Service: Cardiovascular;  Laterality: Right;  . CARDIAC CATHETERIZATION Right 09/14/2015   Procedure: Left Heart Cath and Coronary Angiography;  Surgeon: Laurier Nancy, MD;  Location: [facility] INVASIVE CV [facility];  Service: Cardiovascular;  Laterality: Right;  . CARDIAC CATHETERIZATION N/A 09/14/2015   Procedure: Coronary Stent Intervention;  Surgeon: Alwyn Pea, MD;  Location: [facility] INVASIVE CV LAB;  Service: Cardiovascular;  Laterality: N/A;  . CARDIAC CATHETERIZATION Right 11/20/2015   Procedure: Left Heart Cath and Coronary Angiography;  Surgeon: Laurier Nancy, MD;  Location: [facility] INVASIVE CV [facility];  Service: Cardiovascular;  Laterality: Right;  . CORONARY ANGIOPLASTY WITH STENT PLACEMENT  <2017   @ [facility]/notes 07/03/2013  . HYSTEROTOMY    . PERIPHERAL VASCULAR CATHETERIZATION N/A 08/30/2015   Procedure: Dialysis/Perma Catheter Insertion;  Surgeon: Annice Needy, MD;  Location: [facility] INVASIVE CV [facility];  Service: Cardiovascular;  Laterality: N/A;  . PERIPHERAL VASCULAR CATHETERIZATION N/A 01/01/2016   Procedure: Dialysis/Perma Catheter Insertion;  Surgeon: Annice Needy, MD;  Location: [facility] INVASIVE CV LAB;  Service: Cardiovascular;  Laterality: N/A;  . PERITONEAL CATHETER INSERTION  11/03/2013   [name] 11/03/2013  . PERITONEAL CATHETER REMOVAL  01/01/2016   "took the one from May out; put new PD cath in" (01/19/2016)  . PERITONEAL CATHETER REMOVAL  11/03/2013; 02/11/2014   [name] 11/03/2013; Removal of tunneled catheter/notes 02/11/2014  . SALPINGOOPHORECTOMY Right    [name] 07/03/2013  . TUBAL LIGATION      Current Outpatient Prescriptions  Medication Sig Dispense Refill  . Amino Acids-Protein Hydrolys (FEEDING SUPPLEMENT, PRO-STAT SUGAR FREE 64,) LIQD Take 30 mLs by mouth 3 (three) times daily with meals.    . calcium carbonate (TUMS - DOSED IN MG ELEMENTAL CALCIUM) 500 MG chewable tablet Chew 1 tablet (200 mg of elemental calcium total) by mouth 3 (three) times daily with meals. 120 tablet 0  .  clopidogrel (PLAVIX) 75 MG tablet Take 75 mg by mouth daily.    . colesevelam (WELCHOL) 625 MG tablet Take 1 tablet (625 mg total) by mouth 2 (two) times daily with a meal. 60 tablet 6  . hydrALAZINE (APRESOLINE) 25 MG tablet Take 25 mg by mouth daily.     . insulin glargine (LANTUS) 100 UNIT/ML injection Inject 0.08 mLs (8 Units total) into the skin at bedtime. 10 mL 11  . isosorbide mononitrate (IMDUR) 60 MG 24 hr tablet Take 1.5 tablets (90 mg total) by mouth daily. 30 tablet 00  . lactulose (CHRONULAC) 10 GM/15ML solution Take 10 g by mouth daily.     [name] levETIRAcetam (KEPPRA) 500 MG tablet Take 1 tablet (500 mg total) by mouth every morning. 30 tablet 0  . lisinopril (PRINIVIL,ZESTRIL) 10 MG tablet Take 1 tablet (10 mg total) by mouth daily.    . metoprolol (LOPRESSOR) 50 MG tablet Take 1 tablet (50 mg total) by mouth 2 (two) times daily.    . multivitamin (RENA-VIT) TABS tablet Take 1 tablet by mouth daily.    . mupirocin ointment (BACTROBAN) 2 % Place 1 application into the nose 2 (two) times daily. 22 g 0  . nitroGLYCERIN (NITROSTAT) 0.4 MG SL tablet Place 0.4 mg under the tongue every 5 (five) minutes as needed for chest pain. Reported on 07/10/2015    . omeprazole (PRILOSEC) 20 MG capsule Take 20 mg by mouth 2 (two) times daily before a meal.     . ondansetron (ZOFRAN ODT) 4 MG disintegrating tablet Take 1 tablet (4 mg total) by mouth every 6 (six) hours as needed for nausea or vomiting. 20 tablet 0  . oxyCODONE-acetaminophen (PERCOCET/ROXICET) 5-325 MG tablet Take one tablet by mouth every 6 hours as needed for moderate pain. Do not exceed 4gm of Tylenol in 24 hours 120 tablet 0  . polyethylene glycol (MIRALAX / GLYCOLAX) packet Take 17 g by mouth daily. 14 each 0  . ranolazine (RANEXA) 500 MG 12 hr tablet Take 1 tablet (500 mg total) by mouth 2 (two) times daily. 60 tablet 0  . senna (SENOKOT) 8.6 MG TABS tablet Take 2 tablets by mouth 2 (two) times daily.     [name] torsemide (DEMADEX) 100 MG  tablet Take 100 mg by mouth daily.     No current facility-administered medications for this visit.     Allergies:   Cephalosporins; Penicillins; Lamictal [lamotrigine]; Phenergan [promethazine hcl]; Pravastatin; and Sulfa antibiotics    Social History:  The patient  reports that she quit smoking about 8 months ago. Her smoking use included Cigarettes. She smoked 0.75 packs per day. She has never used smokeless tobacco. She reports that she drinks alcohol. She reports that she does not use drugs.   Family History:  The patient's family history includes Cervical cancer in her mother.    ROS:  Please see the history of present illness. All  other systems are reviewed and negative.    PHYSICAL EXAM: VS:  LMP 03/16/2015  , BMI There is no height or weight on file to calculate BMI. GEN: Well nourished, well developed, female in no acute distress  HEENT: normal for age  Neck: no JVD, no carotid bruit, no masses Cardiac: RRR; no murmur, no rubs, or gallops Respiratory:  clear to auscultation bilaterally, normal work of breathing GI: soft, nontender, nondistended, + BS MS: no deformity or atrophy; no edema; distal pulses are 2+ in all 4 extremities   Skin: warm and dry, no rash Neuro:  Strength and sensation are intact Psych: euthymic mood, full affect   EKG:  EKG {ACTION; IS/IS [id number]} ordered today. The ekg ordered today demonstrates ***   Recent Labs: 10/05/2015: B Natriuretic Peptide 1,751.0 11/19/2015: Magnesium 1.9; TSH 8.570 02/25/2016: ALT 9 03/03/2016: BUN 31; Creatinine, Ser 4.16; Hemoglobin 12.1; Platelets 364; Potassium 3.3; Sodium 130    Lipid Panel    Component Value Date/Time   CHOL 215 (H) 11/19/2015 0617   TRIG 186 (H) 11/19/2015 0617   HDL 63 11/19/2015 0617   CHOLHDL 3.4 11/19/2015 0617   VLDL 37 11/19/2015 0617   LDLCALC 115 (H) 11/19/2015 0617     Wt Readings from Last 3 Encounters:  03/03/16 246 lb (111.6 kg)  02/29/16 246 lb (111.6 kg)  02/27/16  253 lb 12 oz (115.1 kg)     Other studies Reviewed: Additional studies/ records that were reviewed today include: ***.  ASSESSMENT AND PLAN:  1.  ***   Current medicines are reviewed at length with the patient today.  The patient {ACTIONS; HAS/DOES NOT HAVE:19233} concerns regarding medicines.  The following changes have been made:  {PLAN; NO CHANGE:13088:s}  Labs/ tests ordered today include: *** No orders of the defined types were placed in this encounter.    Disposition:   FU with ***  Signed, Theodore DemarkBarrett, Kaori Jumper, PA-C  03/13/2016 8:20 AM    [facility] Phone: [phone]; Fax: [phone]  This note was written with the assistance of speech recognition software. Please excuse any transcriptional errors.

## 2016-03-14 LAB — C DIFFICILE QUICK SCREEN W PCR REFLEX
C Diff antigen: POSITIVE — AB
C Diff toxin: NEGATIVE

## 2016-03-14 LAB — BASIC METABOLIC PANEL
ANION GAP: 9 (ref 5–15)
BUN: 37 mg/dL — ABNORMAL HIGH (ref 6–20)
CHLORIDE: 99 mmol/L — AB (ref 101–111)
CO2: 26 mmol/L (ref 22–32)
Calcium: 8.4 mg/dL — ABNORMAL LOW (ref 8.9–10.3)
Creatinine, Ser: 4.65 mg/dL — ABNORMAL HIGH (ref 0.44–1.00)
GFR calc Af Amer: 12 mL/min — ABNORMAL LOW (ref >60)
GFR, EST NON AFRICAN AMERICAN: 10 mL/min — AB (ref >60)
GLUCOSE: 91 mg/dL (ref 65–99)
POTASSIUM: 3.4 mmol/L — AB (ref 3.5–5.1)
Sodium: 134 mmol/L — ABNORMAL LOW (ref 135–145)

## 2016-03-14 LAB — CBC
HEMATOCRIT: 36.1 % (ref 35.0–47.0)
HEMOGLOBIN: 11.7 g/dL — AB (ref 12.0–16.0)
MCH: 27.7 pg (ref 26.0–34.0)
MCHC: 32.6 g/dL (ref 32.0–36.0)
MCV: 85 fL (ref 80.0–100.0)
Platelets: 274 10*3/uL (ref 150–440)
RBC: 4.25 MIL/uL (ref 3.80–5.20)
RDW: 16.8 % — ABNORMAL HIGH (ref 11.5–14.5)
WBC: 6.5 10*3/uL (ref 3.6–11.0)

## 2016-03-14 LAB — GLUCOSE, CAPILLARY
GLUCOSE-CAPILLARY: 78 mg/dL (ref 65–99)
GLUCOSE-CAPILLARY: 146 mg/dL — AB (ref 65–99)
Glucose-Capillary: 107 mg/dL — ABNORMAL HIGH (ref 65–99)
Glucose-Capillary: 114 mg/dL — ABNORMAL HIGH (ref 65–99)
Glucose-Capillary: 84 mg/dL (ref 65–99)

## 2016-03-14 LAB — TROPONIN I
TROPONIN I: 0.04 ng/mL — AB (ref <0.03)
Troponin I: 0.03 ng/mL (ref <0.03)
Troponin I: 0.06 ng/mL (ref <0.03)

## 2016-03-14 LAB — CLOSTRIDIUM DIFFICILE BY PCR: Toxigenic C. Difficile by PCR: POSITIVE — AB

## 2016-03-14 MED ORDER — VANCOMYCIN 50 MG/ML ORAL SOLUTION
125.0000 mg | Freq: Four times a day (QID) | ORAL | Status: DC
  Administered 2016-03-14 – 2016-03-16 (×7): 125 mg via ORAL
  Filled 2016-03-14 (×10): qty 2.5

## 2016-03-14 MED ORDER — AMLODIPINE BESYLATE 10 MG PO TABS
10.0000 mg | ORAL_TABLET | Freq: Every day | ORAL | Status: DC
  Administered 2016-03-15: 10 mg via ORAL
  Filled 2016-03-14: qty 1

## 2016-03-14 NOTE — Progress Notes (Signed)
Per Dialysis nurse okay to give BP meds as well as aspirin and plavix as patient will not be going to dialysis until later [place]. BP has consistently been in the 180s

## 2016-03-14 NOTE — Progress Notes (Signed)
Dialysis complete

## 2016-03-14 NOTE — Progress Notes (Signed)
[facility] [place] [facility]   PATIENT NAME: Kimberly Montgomery    MR#:  [id number]  DATE OF BIRTH:  December 14, 1965  SUBJECTIVE:  CHIEF COMPLAINT:  Patient came into the hospital for hypotension during hemodialysis Still having diarrhea from C. difficile colitis but denies any nausea or vomiting. Some abdominal discomfort is present.  REVIEW OF SYSTEMS:  CONSTITUTIONAL: No fever, fatigue or weakness.  EYES: No blurred or double vision.  EARS, NOSE, AND THROAT: No tinnitus or ear pain.  RESPIRATORY: No cough, shortness of breath, wheezing or hemoptysis.  CARDIOVASCULAR: No chest pain, orthopnea, edema.  GASTROINTESTINAL: No nausea, vomiting, Reporting diarrhea and abdominal pain.  GENITOURINARY: No dysuria, hematuria.  ENDOCRINE: No polyuria, nocturia,  HEMATOLOGY: No anemia, easy bruising or bleeding SKIN: No rash or lesion. MUSCULOSKELETAL: No joint pain or arthritis.   NEUROLOGIC: No tingling, numbness, weakness.  PSYCHIATRY: No anxiety or depression.   DRUG ALLERGIES:   Allergies  Allergen Reactions  . Cephalosporins Anaphylaxis    Patient has tolerated meropenem.   [name] Penicillins Anaphylaxis and Other (See Comments)    Has patient had a PCN reaction causing immediate rash, facial/tongue/throat swelling, SOB or lightheadedness with hypotension: Yes Has patient had a PCN reaction causing severe rash involving mucus membranes or skin necrosis: No Has patient had a PCN reaction that required hospitalization No Has patient had a PCN reaction occurring within the last 10 years: No If all of the above answers are "NO", then may proceed with Cephalosporin use.  . Lamictal [Lamotrigine] Other (See Comments)    Reaction:  Hallucinations  . Phenergan [Promethazine Hcl] Nausea And Vomiting  . Pravastatin Other (See Comments)    Reaction:  Muscle pain   . Sulfa Antibiotics Other (See Comments)    Reaction:  Unknown     VITALS:  Blood pressure (!) 157/71,  pulse 79, temperature 97.9 F (36.6 C), temperature source Oral, resp. rate 16, height 5\' 9"  (1.753 m), weight 109.3 kg (240 lb 14.4 oz), last menstrual period 03/16/2015, SpO2 95 %.  PHYSICAL EXAMINATION:  GENERAL:  50 y.o.-year-old patient lying in the bed with no acute distress.  EYES: Pupils equal, round, reactive to light and accommodation. No scleral icterus. Extraocular muscles intact.  HEENT: Head atraumatic, normocephalic. Oropharynx and nasopharynx clear.  NECK:  Supple, no jugular venous distention. No thyroid enlargement, no tenderness.  LUNGS: Normal breath sounds bilaterally, no wheezing, rales,rhonchi or crepitation. No use of accessory muscles of respiration.  CARDIOVASCULAR: S1, S2 normal. No murmurs, rubs, or gallops.  ABDOMEN: Soft,  lower abdominal tenderness is present but no rebound tenderness. Not distended. Bowel sounds present. No organomegaly or mass.  EXTREMITIES: No pedal edema, cyanosis, or clubbing.  NEUROLOGIC: Cranial nerves II through XII are intact. Muscle strength 5/5 in all extremities. Sensation intact. Gait not checked.  PSYCHIATRIC: The patient is alert and oriented x 3.  SKIN: No obvious rash, lesion, or ulcer.    LABORATORY PANEL:   CBC  Recent Labs Lab 03/14/16 0742  WBC 6.5  HGB 11.7*  HCT 36.1  PLT 274   ------------------------------------------------------------------------------------------------------------------  Chemistries   Recent Labs Lab 03/13/16 1612 03/14/16 0742  NA 133* 134*  K 3.3* 3.4*  CL 98* 99*  CO2 28 26  GLUCOSE 118* 91  BUN 32* 37*  CREATININE 3.95* 4.65*  CALCIUM 8.5* 8.4*  AST 12*  --   ALT 8*  --   ALKPHOS 73  --   BILITOT 0.5  --    ------------------------------------------------------------------------------------------------------------------  Cardiac Enzymes  Recent Labs Lab 03/14/16 1301  TROPONINI 0.06*    ------------------------------------------------------------------------------------------------------------------  RADIOLOGY:  Ct Abdomen Pelvis Wo Contrast  Result Date: 03/13/2016 CLINICAL DATA:  50 year old female with a history of lower abdominal pain for 1 day EXAM: CT ABDOMEN AND PELVIS WITHOUT CONTRAST TECHNIQUE: Multidetector CT imaging of the abdomen and pelvis was performed following the standard protocol without IV contrast. COMPARISON:  Chest x-ray 03/13/2016, CT 07/15/2015 FINDINGS: Lower chest: Small left pleural effusion with associated atelectasis. Calcifications of native coronary vasculature Hepatobiliary: No focal liver abnormality is seen. No gallstones, gallbladder wall thickening, or biliary dilatation.Unremarkable gallbladder. Pancreas: Atrophic pancreatic parenchyma. Spleen: Normal in size without focal abnormality. Adrenals/Urinary Tract: Right adrenal gland unremarkable.  Left adrenal gland unremarkable. Right kidney: No right-sided hydronephrosis.  Vascular calcifications. Left Kidney: No left-sided hydronephrosis or nephrolithiasis. No significant stranding. Course of the bilateral ureters unremarkable. Unremarkable appearance of the urinary bladder. Stomach/Bowel: Stomach partially distended with enteric contrast. Small hiatal hernia. Unremarkable appearance of small bowel. No abnormally distended small bowel. Normal appendix, although high density contents within the lumen. No associated inflammation. Circumferential wall thickening of the distal sigmoid colon and rectum extending nearly to the anal verge. There is associated infiltration/ edema within the adjacent fat. Small volume formed stool within the rectum. More proximal colon is relatively decompressed without evidence of transition point or obstruction. Colonic diverticular present without changes to suggest acute diverticulitis. Vascular/Lymphatic: Dense calcifications of the abdominal aorta and the mesenteric  vessels. Calcifications extend into the iliac vessels, proximal femoral vessels, and the pelvic vasculature. There has been interval development of borderline enlarged lymph nodes in the lymphatic drainage pathway of the rectum through the IMA/IMV vasculature. Largest index lymph node at the level of the sacral base, new from the prior, measuring 12 mm. Reproductive: Gas within the vagina extending to the cervix with complex debris of unknown etiology. Unremarkable uterus and adnexa. Other: Extensive infiltration of the body wall with edema/ anasarca. Interval removal of the peritoneal dialysis catheter. High density material anterior to the right abdominal wall musculature measuring 60- 70 Hounsfield units. Musculoskeletal: Degenerative changes of the visualized spine. Endplate irregularity at inferior L3, superior L4, which was present on the comparison CT. Most likely these represent Schmorl nodes. Degenerative changes of the bilateral hips. IMPRESSION: Circumferential inflammatory changes/ thickening of the rectum, with associated edema/ inflammation of the adjacent fat. Findings are most suggestive of colitis, potentially inflammatory/infectious etiology. Recommend correlation with patient presentation and lab values. Compared to the CT of April 2017, there is new lymphadenopathy in the lymphatic drainage of the rectum, following the IMV/IMA vasculature. Index lymph node at the sacral base measures 12 mm. While this is most likely reactive inflammatory adenopathy, referral for colonoscopy is recommended, as an occult colon cancer could have this appearance/presentation. No evidence of colonic obstruction or fecal impaction. Extensive body wall anasarca/ edema. Recommend correlation with patient's fluid status. There is associated small left basilar pleural effusion. Interval removal of peritoneal dialysis catheter since the prior CT. High density material within the subcutaneous fat overlying the right  abdominal wall may represent blood products. Gas/ debris within the vagina extending to the cervix, of uncertain etiology. A colo-vaginal fistula is unlikely, although could have this presentation, and correlation with patient presentation may be useful. Aortic atherosclerosis.  Associated coronary artery disease. Signed, Yvone NeuJaime S. Loreta AveWagner, DO [facility] Electronically Signed   By: Gilmer MorJaime  Wagner D.O.   On: 03/13/2016 19:21   Dg Chest  Portable 1 View  Result Date: 03/13/2016 CLINICAL DATA:  Acute hypotensive episode today after dialysis. EXAM: PORTABLE CHEST 1 VIEW COMPARISON:  03/03/2016 FINDINGS: Heart size remains at the upper limits of normal. Left jugular dual-lumen central venous catheter remains in appropriate position. No evidence of pneumothorax. No evidence of congestive heart failure. Opacity is seen in the left retrocardiac lung base silhouetting the left hemidiaphragm, which may be due to atelectasis or infiltrate. IMPRESSION: Retrocardiac left lower lobe atelectasis versus infiltrate. Electronically Signed   By: Myles Rosenthal M.D.   On: 03/13/2016 16:58    EKG:   Orders placed or performed during the hospital encounter of 03/13/16  . EKG 12-Lead  . EKG 12-Lead  . EKG 12-Lead  . EKG 12-Lead    ASSESSMENT AND PLAN:    #Colitis -Clostridium diff colitis  Discontinue IV levofloxacin and patient is started on by mouth vancomycin likely etiology for her abdominal discomfort and diarrhea.   White blood cell count is within normal limits.    # Accelerated hypertension -better today  Blood pressure goal less than 160/100.  No significant trending troponins  Continue hydralazine, amlodipine, Coreg and titrate as needed   #Type 2 diabetes mellitus (HCC) - renal/carb modified diet, sliding scale insulin with appropriate glucose checks     #CAD (coronary artery disease) - continue aspirin 81 mg, Coreg, Plavix, trend troponins as  above. No active chest pain.   # ESRD on dialysis [facility] [address]) - nephrology consult for dialysis assistance    # Chronic diastolic congestive heart failure (HCC) - continue home meds    GERD - PPI   Seizures (HCC) - Wellbutrin extended release    All the records are reviewed and case discussed with Care Management/Social Workerr. Management plans discussed with the patient, family and they are in agreement.  CODE STATUS: fc  TOTAL TIME TAKING CARE OF THIS PATIENT: .   POSSIBLE D/C IN  1-2DAYS, DEPENDING ON CLINICAL CONDITION.  Note: This dictation was prepared with Dragon dictation along with smaller phrase technology. Any transcriptional errors that result from this process are unintentional.   Ramonita Lab M.D on 03/14/2016 at 4:47 PM  Between 7am to 6pm - Pager - [phone] After 6pm go to [email or web address] - password [email or web address] [facility]  [facility]  Office  [phone]  CC: Primary care physician; Jarome Matin, MD

## 2016-03-14 NOTE — Progress Notes (Signed)
OKay per Dr. Amado Coe to discontinue order for continuous pulse ox

## 2016-03-14 NOTE — Progress Notes (Signed)
Post dialysis 

## 2016-03-14 NOTE — [place] FL2 (Addendum)
[place] MEDICAID FL2 LEVEL OF CARE SCREENING TOOL     IDENTIFICATION  Patient Name: Kimberly AristaLaura L Potempa Birthdate: 11/29/1965 Sex: female Admission Date (Current Location): 03/13/2016  [place]   Facility and Address:  [facility], [address], [place], [place] [address]      Provider Number: [id number]  Attending Physician Name and Address:  Ramonita LabAruna Sivan Quast, MD  Relative Name and Phone Number:       Current Level of Care: Hospital Recommended Level of Care: Skilled Nursing Facility Prior Approval Number:    Date Approved/Denied:   PASRR Number: [id number] (expires on 03/24/2016)  Discharge Plan: SNF    Current Diagnoses: Patient Active Problem List   Diagnosis Date Noted  . Colitis 03/13/2016  . Chronic diastolic congestive heart failure (HCC) 01/22/2016  . Pressure injury of skin 01/20/2016  . Bacteremia 12/27/2015  . Essential hypertension 11/21/2015  . Anemia of chronic disease 11/21/2015  . Acute respiratory failure with hypoxia (HCC) 11/21/2015  . Acute diastolic CHF (congestive heart failure) (HCC) 11/21/2015  . ESRD on dialysis (HCC) 11/21/2015  . 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 NSTEMI (non-ST elevated myocardial infarction) (HCC) 11/20/2015  . Chest pain, rule out acute myocardial infarction 11/18/2015  . Bipolar I disorder, most recent episode depressed (HCC)   . Altered mental status 08/24/2015  . Ileus (HCC)   . Bipolar I disorder (HCC) 07/25/2015  . Seizures (HCC) 07/25/2015  . Peritonitis (HCC) 07/16/2015  . Unstable angina (HCC) 07/09/2015  . ESRD on peritoneal dialysis (HCC) 07/09/2015  . Accelerated hypertension 07/09/2015  . Type 2 diabetes mellitus (HCC) 07/09/2015  . CAD (coronary artery disease) 07/09/2015  . HLD (hyperlipidemia) 07/09/2015  . GERD (gastroesophageal reflux disease) 07/09/2015    Orientation RESPIRATION BLADDER Height & Weight     Self, Place  Normal Continent Weight:  240 lb 14.4 oz (109.3 kg) Height:  5\' 9"  (175.3 cm)  BEHAVIORAL SYMPTOMS/MOOD NEUROLOGICAL BOWEL NUTRITION STATUS   (none)   Incontinent Diet (renal)  AMBULATORY STATUS COMMUNICATION OF NEEDS Skin   Extensive Assist Verbally PU Stage and Appropriate Care                       Personal Care Assistance Level of Assistance  Bathing, Dressing Bathing Assistance: Maximum assistance   Dressing Assistance: Maximum assistance     Functional Limitations Info             SPECIAL CARE FACTORS FREQUENCY                       Contractures Contractures Info: Not present    Additional Factors Info                  Current Medications (03/14/2016):  This is the current hospital active medication list Current Facility-Administered Medications  Medication Dose Route Frequency Provider Last Rate Last Dose  . acetaminophen (TYLENOL) tablet 650 mg  650 mg Oral Q6H PRN Oralia Manisavid Willis, MD       Or  . acetaminophen (TYLENOL) suppository 650 mg  650 mg Rectal Q6H PRN Oralia Manisavid Willis, MD      . Melene Muller[START ON 03/15/2016] amLODipine (NORVASC) tablet 10 mg  10 mg Oral QHS Harmeet Singh, MD      . aspirin EC tablet 81 mg  81 mg Oral Daily Oralia Manisavid Willis, MD   81 mg at 03/14/16 1331  . buPROPion (WELLBUTRIN XL) 24 hr tablet 150 mg  150 mg Oral Daily Oralia Manis, MD   150 mg at 03/14/16 1015  . calcium carbonate (TUMS - dosed in mg elemental calcium) chewable tablet 200 mg of elemental calcium  1 tablet Oral TID WC Oralia Manis, MD   200 mg of elemental calcium at 03/14/16 1315  . carvedilol (COREG) tablet 6.25 mg  6.25 mg Oral BID Oralia Manis, MD      . clopidogrel (PLAVIX) tablet 75 mg  75 mg Oral Daily Oralia Manis, MD   75 mg at 03/14/16 1329  . gabapentin (NEURONTIN) capsule 300 mg  300 mg Oral TID Oralia Manis, MD   300 mg at 03/14/16 1012  . heparin injection 5,000 Units  5,000 Units Subcutaneous Q8H Oralia Manis, MD   5,000 Units at 03/14/16 0544  . hydrALAZINE (APRESOLINE) tablet 25 mg   25 mg Oral Daily Oralia Manis, MD   25 mg at 03/14/16 1332  . insulin aspart (novoLOG) injection 0-5 Units  0-5 Units Subcutaneous QHS Oralia Manis, MD      . insulin aspart (novoLOG) injection 0-9 Units  0-9 Units Subcutaneous TID WC Oralia Manis, MD      . isosorbide mononitrate (IMDUR) 24 hr tablet 90 mg  90 mg Oral Daily Oralia Manis, MD   90 mg at 03/14/16 1331  . labetalol (NORMODYNE,TRANDATE) injection 10 mg  10 mg Intravenous Q2H PRN Oralia Manis, MD   10 mg at 03/14/16 0018  . levETIRAcetam (KEPPRA) tablet 500 mg  500 mg Oral q morning - 10a Oralia Manis, MD   500 mg at 03/14/16 1012  . lisinopril (PRINIVIL,ZESTRIL) tablet 10 mg  10 mg Oral Daily Oralia Manis, MD   10 mg at 03/14/16 1330  . metoCLOPramide (REGLAN) tablet 10 mg  10 mg Oral QID Oralia Manis, MD   10 mg at 03/14/16 1330  . morphine 4 MG/ML injection 2 mg  2 mg Intravenous Q4H PRN Oralia Manis, MD      . ondansetron [facility]) tablet 4 mg  4 mg Oral Q6H PRN Oralia Manis, MD       Or  . ondansetron [facility]) injection 4 mg  4 mg Intravenous Q6H PRN Oralia Manis, MD      . oxyCODONE-acetaminophen (PERCOCET/ROXICET) 5-325 MG per tablet 1 tablet  1 tablet Oral Q6H PRN Oralia Manis, MD   1 tablet at 03/14/16 0018  . pantoprazole (PROTONIX) EC tablet 40 mg  40 mg Oral BID Oralia Manis, MD   40 mg at 03/14/16 1011  . risperiDONE (RISPERDAL) tablet 1 mg  1 mg Oral TID Oralia Manis, MD   1 mg at 03/14/16 1011  . sevelamer carbonate (RENVELA) tablet 800 mg  800 mg Oral TID WC Oralia Manis, MD   800 mg at 03/14/16 1316  . sevelamer carbonate (RENVELA) tablet 800 mg  800 mg Oral QHS Oralia Manis, MD   800 mg at 03/14/16 0009  . torsemide (DEMADEX) tablet 100 mg  100 mg Oral Daily Oralia Manis, MD   100 mg at 03/14/16 1343  . vancomycin (VANCOCIN) 50 mg/mL oral solution 125 mg  125 mg Oral Q6H Ramonita Lab, MD         Discharge Medications: Please see discharge summary for a list of discharge medications.  Relevant Imaging  Results:  Relevant Lab Results:   Additional Information ss: [id number]  York Spaniel, LCSW

## 2016-03-14 NOTE — Progress Notes (Signed)
Subjective:   Patient presented from dialysis yesterday after an episode of hypotension. Patient reports abdominal pain for several days CT showed colitis.  C Diff is positive in stool    Objective:  Vital signs in last 24 hours:  Temp:  [97.5 F (36.4 C)-98.6 F (37 C)] 97.9 F (36.6 C) (12/14 0955) Pulse Rate:  [74-87] 79 (12/14 1555) Resp:  [10-25] 16 (12/14 0955) BP: (137-212)/(71-136) 157/71 (12/14 1555) SpO2:  [95 %-99 %] 95 % (12/14 0955) Weight:  [108.8 kg (239 lb 14.4 oz)-109.3 kg (240 lb 14.4 oz)] 109.3 kg (240 lb 14.4 oz) (12/14 0500)  Weight change:  Filed Weights   03/13/16 1537 03/13/16 2241 03/14/16 0500  Weight: 111.6 kg (246 lb) 108.8 kg (239 lb 14.4 oz) 109.3 kg (240 lb 14.4 oz)    Intake/Output:    Intake/Output Summary (Last 24 hours) at 03/14/16 1705 Last data filed at 03/14/16 1616  Gross per 24 hour  Intake              320 ml  Output               50 ml  Net              270 ml     Physical Exam: General: No acute distress, laying in the bed  HEENT anicteric  Neck supple  Pulm/lungs  clear to auscultation  CVS/Heart No rub or gallop  Abdomen:  Mild diffuse tenderness  Extremities: 1-2+ edema  Neurologic: Alert, oriented with episodes of forgetfulness  Skin: Normal turgor  Access: permCath       Basic Metabolic Panel:   Recent Labs Lab 03/13/16 1612 03/14/16 0742  NA 133* 134*  K 3.3* 3.4*  CL 98* 99*  CO2 28 26  GLUCOSE 118* 91  BUN 32* 37*  CREATININE 3.95* 4.65*  CALCIUM 8.5* 8.4*     CBC:  Recent Labs Lab 03/13/16 1612 03/14/16 0742  WBC 6.2 6.5  NEUTROABS 4.4  --   HGB 11.4* 11.7*  HCT 35.5 36.1  MCV 84.8 85.0  PLT 308 274      Microbiology:  Recent Results (from the past 720 hour(s))  MRSA PCR Screening     Status: Abnormal   Collection Time: 02/25/16  6:40 PM  Result Value Ref Range Status   MRSA by PCR POSITIVE (A) NEGATIVE Final    Comment:        The GeneXpert MRSA Assay (FDA approved  for NASAL specimens only), is one component of a comprehensive MRSA colonization surveillance program. It is not intended to diagnose MRSA infection nor to guide or monitor treatment for MRSA infections. RESULT CALLED TO, READ BACK BY AND VERIFIED WITH: MARSHA [facility] 02/25/16 @ 2118  MLK   Culture, blood (Routine X 2) w Reflex to ID Panel     Status: None   Collection Time: 02/25/16  7:26 PM  Result Value Ref Range Status   Specimen Description BLOOD RIGHT ANTECUBITAL  Final   Special Requests   Final    BOTTLES DRAWN AEROBIC AND ANAEROBIC 11 CC AEROBIC, 10 CC AEROBIC   Culture NO GROWTH 5 DAYS  Final   Report Status 03/01/2016 FINAL  Final  Culture, blood (Routine X 2) w Reflex to ID Panel     Status: None   Collection Time: 02/25/16  7:35 PM  Result Value Ref Range Status   Specimen Description BLOOD RIGHT HAND  Final   Special Requests   Final  BOTTLES DRAWN AEROBIC AND ANAEROBIC 4CC ANAEROBIC, 5CC AEROBIC   Culture NO GROWTH 5 DAYS  Final   Report Status 03/01/2016 FINAL  Final  Body fluid culture     Status: None   Collection Time: 02/26/16  9:54 AM  Result Value Ref Range Status   Specimen Description PERITONEAL  Final   Special Requests NONE  Final   Gram Stain   Final    MODERATE WBC PRESENT,BOTH PMN AND MONONUCLEAR NO ORGANISMS SEEN    Culture   Final    NO GROWTH 3 DAYS Performed at [facility]    Report Status 02/29/2016 FINAL  Final  Body fluid culture     Status: None   Collection Time: 02/27/16 12:20 PM  Result Value Ref Range Status   Specimen Description HEMODIALYSIS CATHETER  Final   Special Requests NONE  Final   Gram Stain   Final    MODERATE WBC PRESENT, PREDOMINANTLY MONONUCLEAR NO ORGANISMS SEEN    Culture   Final    No growth aerobically or anaerobically. Performed at [facility]    Report Status 03/01/2016 FINAL  Final  C difficile quick scan w PCR reflex     Status: Abnormal   Collection Time: 03/13/16 10:21 PM  Result  Value Ref Range Status   C Diff antigen POSITIVE (A) NEGATIVE Final   C Diff toxin NEGATIVE NEGATIVE Final   C Diff interpretation Results are indeterminate. See PCR results.  Final  Clostridium Difficile by PCR     Status: Abnormal   Collection Time: 03/13/16 10:21 PM  Result Value Ref Range Status   Toxigenic C Difficile by pcr POSITIVE (A) NEGATIVE Final    Comment: Positive for toxigenic C. difficile with little to no toxin production. Only treat if clinical presentation suggests symptomatic illness.    Coagulation Studies: No results for input(s): LABPROT, INR in the last 72 hours.  Urinalysis:  Recent Labs  03/13/16 1912  COLORURINE YELLOW*  LABSPEC 1.014  PHURINE 5.0  GLUCOSEU 50*  HGBUR NEGATIVE  BILIRUBINUR NEGATIVE  KETONESUR NEGATIVE  PROTEINUR >=300*  NITRITE NEGATIVE  LEUKOCYTESUR TRACE*      Imaging: Ct Abdomen Pelvis Wo Contrast  Result Date: 03/13/2016 CLINICAL DATA:  50 year old female with a history of lower abdominal pain for 1 day EXAM: CT ABDOMEN AND PELVIS WITHOUT CONTRAST TECHNIQUE: Multidetector CT imaging of the abdomen and pelvis was performed following the standard protocol without IV contrast. COMPARISON:  Chest x-ray 03/13/2016, CT 07/15/2015 FINDINGS: Lower chest: Small left pleural effusion with associated atelectasis. Calcifications of native coronary vasculature Hepatobiliary: No focal liver abnormality is seen. No gallstones, gallbladder wall thickening, or biliary dilatation.Unremarkable gallbladder. Pancreas: Atrophic pancreatic parenchyma. Spleen: Normal in size without focal abnormality. Adrenals/Urinary Tract: Right adrenal gland unremarkable.  Left adrenal gland unremarkable. Right kidney: No right-sided hydronephrosis.  Vascular calcifications. Left Kidney: No left-sided hydronephrosis or nephrolithiasis. No significant stranding. Course of the bilateral ureters unremarkable. Unremarkable appearance of the urinary bladder. Stomach/Bowel:  Stomach partially distended with enteric contrast. Small hiatal hernia. Unremarkable appearance of small bowel. No abnormally distended small bowel. Normal appendix, although high density contents within the lumen. No associated inflammation. Circumferential wall thickening of the distal sigmoid colon and rectum extending nearly to the anal verge. There is associated infiltration/ edema within the adjacent fat. Small volume formed stool within the rectum. More proximal colon is relatively decompressed without evidence of transition point or obstruction. Colonic diverticular present without changes to suggest acute diverticulitis. Vascular/Lymphatic: Dense  calcifications of the abdominal aorta and the mesenteric vessels. Calcifications extend into the iliac vessels, proximal femoral vessels, and the pelvic vasculature. There has been interval development of borderline enlarged lymph nodes in the lymphatic drainage pathway of the rectum through the IMA/IMV vasculature. Largest index lymph node at the level of the sacral base, new from the prior, measuring 12 mm. Reproductive: Gas within the vagina extending to the cervix with complex debris of unknown etiology. Unremarkable uterus and adnexa. Other: Extensive infiltration of the body wall with edema/ anasarca. Interval removal of the peritoneal dialysis catheter. High density material anterior to the right abdominal wall musculature measuring 60- 70 Hounsfield units. Musculoskeletal: Degenerative changes of the visualized spine. Endplate irregularity at inferior L3, superior L4, which was present on the comparison CT. Most likely these represent Schmorl nodes. Degenerative changes of the bilateral hips. IMPRESSION: Circumferential inflammatory changes/ thickening of the rectum, with associated edema/ inflammation of the adjacent fat. Findings are most suggestive of colitis, potentially inflammatory/infectious etiology. Recommend correlation with patient presentation  and lab values. Compared to the CT of April 2017, there is new lymphadenopathy in the lymphatic drainage of the rectum, following the IMV/IMA vasculature. Index lymph node at the sacral base measures 12 mm. While this is most likely reactive inflammatory adenopathy, referral for colonoscopy is recommended, as an occult colon cancer could have this appearance/presentation. No evidence of colonic obstruction or fecal impaction. Extensive body wall anasarca/ edema. Recommend correlation with patient's fluid status. There is associated small left basilar pleural effusion. Interval removal of peritoneal dialysis catheter since the prior CT. High density material within the subcutaneous fat overlying the right abdominal wall may represent blood products. Gas/ debris within the vagina extending to the cervix, of uncertain etiology. A colo-vaginal fistula is unlikely, although could have this presentation, and correlation with patient presentation may be useful. Aortic atherosclerosis.  Associated coronary artery disease. Signed, Yvone Neu. Loreta Ave, DO [facility] Electronically Signed   By: Gilmer Mor D.O.   On: 03/13/2016 19:21   Dg Chest Portable 1 View  Result Date: 03/13/2016 CLINICAL DATA:  Acute hypotensive episode today after dialysis. EXAM: PORTABLE CHEST 1 VIEW COMPARISON:  03/03/2016 FINDINGS: Heart size remains at the upper limits of normal. Left jugular dual-lumen central venous catheter remains in appropriate position. No evidence of pneumothorax. No evidence of congestive heart failure. Opacity is seen in the left retrocardiac lung base silhouetting the left hemidiaphragm, which may be due to atelectasis or infiltrate. IMPRESSION: Retrocardiac left lower lobe atelectasis versus infiltrate. Electronically Signed   By: Myles Rosenthal M.D.   On: 03/13/2016 16:58     Medications:    . [START ON 03/15/2016] amLODipine  10 mg Oral QHS  . aspirin  EC  81 mg Oral Daily  . buPROPion  150 mg Oral Daily  . calcium carbonate  1 tablet Oral TID WC  . carvedilol  6.25 mg Oral BID  . clopidogrel  75 mg Oral Daily  . gabapentin  300 mg Oral TID  . heparin  5,000 Units Subcutaneous Q8H  . hydrALAZINE  25 mg Oral Daily  . insulin aspart  0-5 Units Subcutaneous QHS  . insulin aspart  0-9 Units Subcutaneous TID WC  . isosorbide mononitrate  90 mg Oral Daily  . levETIRAcetam  500 mg Oral q morning - 10a  . lisinopril  10 mg Oral Daily  . metoCLOPramide  10 mg Oral QID  . pantoprazole  40 mg Oral BID  .  risperiDONE  1 mg Oral TID  . sevelamer carbonate  800 mg Oral TID WC  . sevelamer carbonate  800 mg Oral QHS  . torsemide  100 mg Oral Daily  . vancomycin  125 mg Oral Q6H   acetaminophen **OR** acetaminophen, labetalol, morphine injection, ondansetron **OR** ondansetron (ZOFRAN) IV, oxyCODONE-acetaminophen  Assessment/ Plan:  50 y.o. female  with end-stage renal disease on hemodialysis, asthma, gastroparesis, diabetes mellitus type II, congestive heart failure, hypertension, endometriosis, coronary atherosclerosis, drug-eluting stent to LAD 2017  [facility]/ [address]/ TTS   1. End-stage renal disease on hemodialysis dialysis with LE edema - will schedule for routine dialysis today  2.  Anemia of chronic kidney disease - hemoglobin 11.7, monitor  3.  Secondary hyperparathyroidism - Moderate phosphorus during hospital stay  4.  C. Difficile colitis Management as per hospitalist   LOS: 1 Crystal Scarberry 12/14/20175:05 PM

## 2016-03-14 NOTE — Clinical Social Work Note (Signed)
CSW informed by Admission's Coordinator: Shanda Bumps that patient is a long term resident at [facility] but that I would need to speak with her supervisor: [name] regarding the fact that they may not accept patient back. CSW spoke with [name] at [facility] and she informed CSW that patient owed money to their facility and that she would not be able to return. CSW received a text from [facility] late this afternoon stating that patient's daughter: Santo Held had stopped by and paid off the balance owed and that they could now take patient back at discharge. CSW to reach out to patient's daughter and complete full assessment. York Spaniel MSW,LCSW [phone]

## 2016-03-14 NOTE — Progress Notes (Signed)
Pre Dialysis 

## 2016-03-14 NOTE — Progress Notes (Signed)
Dialysis started 

## 2016-03-14 NOTE — Progress Notes (Signed)
Patient was transferred from the ER via a stretcher. On admission patient has a levaquin infusing. She was A&O X4, denied pain and N&V. Patient has blanchable redness on her sacrum, foam dressing was applied, she also have a gauze dressing on her right lower abdomen covering he peritoneal dialysis assess. Patient was placed on tele monitor, she is NSR with SBP in the 170s one time PRN labetelol was given.  Dr. Sheryle Hail was notified of SBP being in 170s, no new order received. Patient rested for most of the night without ant acute event

## 2016-03-15 ENCOUNTER — Ambulatory Visit: Payer: Medicare HMO | Admitting: Physician Assistant

## 2016-03-15 LAB — GLUCOSE, CAPILLARY
Glucose-Capillary: 147 mg/dL — ABNORMAL HIGH (ref 65–99)
Glucose-Capillary: 171 mg/dL — ABNORMAL HIGH (ref 65–99)
Glucose-Capillary: 174 mg/dL — ABNORMAL HIGH (ref 65–99)
Glucose-Capillary: 139 mg/dL — ABNORMAL HIGH (ref 65–99)

## 2016-03-15 MED ORDER — HYDRALAZINE HCL 50 MG PO TABS
50.0000 mg | ORAL_TABLET | Freq: Once | ORAL | Status: AC
  Administered 2016-03-15: 50 mg via ORAL
  Filled 2016-03-15: qty 1

## 2016-03-15 MED ORDER — LABETALOL HCL 5 MG/ML IV SOLN
5.0000 mg | Freq: Once | INTRAVENOUS | Status: AC
Start: 2016-03-15 — End: 2016-03-15
  Administered 2016-03-15: 5 mg via INTRAVENOUS
  Filled 2016-03-15: qty 4

## 2016-03-15 MED ORDER — HYDRALAZINE HCL 50 MG PO TABS
50.0000 mg | ORAL_TABLET | Freq: Three times a day (TID) | ORAL | Status: DC
  Administered 2016-03-15 – 2016-03-16 (×3): 50 mg via ORAL
  Filled 2016-03-15 (×3): qty 1

## 2016-03-15 NOTE — Evaluation (Signed)
Physical Therapy Evaluation Patient Details Name: Kimberly Montgomery MRN: [id number] DOB: 09-Sep-1965 Today's Date: 03/15/2016   History of Present Illness  Pt is a 50 y.o. female who presents with an episode of hypotension during hemodialysis.  Afterwards in the ED she is noted to be significantly hypertensive. She states that she used to do peritoneal dialysis, but has not been switched to hemodialysis. She also complains of abdominal pain for the past 2 weeks, which is been progressively getting worse. CT scan in the ED showed likely colitis. Hospitalists were called for admission and further evaluation and treatment.  Pt positive for C-diff.   Clinical Impression  Pt presents with deficits in strength, transfers, mobility, gait, balance, and activity tolerance.  Pt has a h/o R BKA and reports is currently working with PT at [facility] and has been able to amb 5' or so with PT but requires her R posthetic limb to do so which she does not have here.  Pt required max A with all bed mobility tasks with poor initial sitting balance at EOB that progressed to fair during session.  Attempted sit to stand at elevated EOB with pt unable to clear surface of bed.  Pt will benefit from PT services to address above deficits for decreased caregiver assistance upon discharge.     Follow Up Recommendations SNF    Equipment Recommendations  None recommended by PT    Recommendations for Other Services       Precautions / Restrictions Precautions Precautions: Fall Restrictions Weight Bearing Restrictions: No      Mobility  Bed Mobility Overal bed mobility: Needs Assistance Bed Mobility: Supine to Sit;Sit to Supine     Supine to sit: Max assist Sit to supine: Max assist   General bed mobility comments: Max A with all bed mobility tasks with mod A to maintain sitting balance initially but progressed to SBA during session.   Transfers                 General transfer comment: Attempted  sit to stand transfers with total assist from therapist. Pt has R BKA but does not have her prosthesis available. Pt unable to clear buttocks from bed.   Ambulation/Gait             General Gait Details: Unable to attempt at this time  Stairs            Wheelchair Mobility    Modified Rankin (Stroke Patients Only)       Balance Overall balance assessment: Needs assistance   Sitting balance-Leahy Scale: Poor Sitting balance - Comments: Poor sitting balance initially but progressed to fair during session with verbal cues for positioning/hand placement Postural control: Left lateral lean     Standing balance comment: Pt unable to stand                             Pertinent Vitals/Pain Pain Assessment: 0-10 Pain Score: 6  Pain Location: Abdominal area Pain Descriptors / Indicators: Aching Pain Intervention(s): Monitored during session    Home Living Family/patient expects to be discharged to:: Skilled nursing facility                 Additional Comments: Pt lives at [facility]    Prior Function Level of Independence: Needs assistance   Gait / Transfers Assistance Needed: Pt requires extensive assistance with transfers  ADL's / Homemaking Assistance Needed: Assistance with  ADLs        Hand Dominance   Dominant Hand: Right    Extremity/Trunk Assessment   Upper Extremity Assessment Upper Extremity Assessment: Generalized weakness    Lower Extremity Assessment Lower Extremity Assessment: Generalized weakness RLE Deficits / Details: Pt with R BKA       Communication   Communication: No difficulties  Cognition Arousal/Alertness: Awake/alert Behavior During Therapy: WFL for tasks assessed/performed Overall Cognitive Status: Within Functional Limits for tasks assessed                      General Comments      Exercises Total Joint Exercises Ankle Circles/Pumps: AROM;Left;10 reps;15 reps Quad Sets: AROM;Both;10  reps Gluteal Sets: AROM;Both;10 reps Hip ABduction/ADduction: AAROM;Both;10 reps Long Arc Quad: AROM;Both;10 reps Knee Flexion: AROM;Both;10 reps   Assessment/Plan    PT Assessment Patient needs continued PT services  PT Problem List Decreased strength;Decreased activity tolerance;Decreased balance;Decreased mobility          PT Treatment Interventions DME instruction;Gait training;Functional mobility training;Therapeutic activities;Therapeutic exercise;Balance training;Neuromuscular re-education;Patient/family education    PT Goals (Current goals can be found in the Care Plan section)  Acute Rehab PT Goals Patient Stated Goal: I want to be able to walk in my room PT Goal Formulation: With patient Time For Goal Achievement: 03/28/16 Potential to Achieve Goals: Fair    Frequency Min 2X/week   Barriers to discharge        Co-evaluation               End of Session Equipment Utilized During Treatment: Gait belt Activity Tolerance: Patient limited by fatigue Patient left: in bed;with bed alarm set;with call bell/phone within reach           Time: 1335-1405 PT Time Calculation (min) (ACUTE ONLY): 30 min   Charges:   PT Evaluation $PT Eval Low Complexity: 1 Procedure PT Treatments $Therapeutic Exercise: 8-22 mins   PT G Codes:        Elly Modena. Scott Zela Sobieski PT, DPT 03/15/16, 2:27 PM

## 2016-03-15 NOTE — Progress Notes (Signed)
[facility] - [place] at [facility]   PATIENT NAME: Kimberly Montgomery    MR#:  [id number]  DATE OF BIRTH:  1966/01/25  SUBJECTIVE:  CHIEF COMPLAINT:  Patient came into the hospital for hypotension during hemodialysis Diarrhea improved. Still having some abdominal discomfort. Reporting headache today   REVIEW OF SYSTEMS:  CONSTITUTIONAL: No fever, fatigue or weakness.  EYES: No blurred or double vision.  EARS, NOSE, AND THROAT: No tinnitus or ear pain.  RESPIRATORY: No cough, shortness of breath, wheezing or hemoptysis.  CARDIOVASCULAR: No chest pain, orthopnea, edema.  GASTROINTESTINAL: No nausea, vomiting, Reporting diarrhea and abdominal pain.  GENITOURINARY: No dysuria, hematuria.  ENDOCRINE: No polyuria, nocturia,  HEMATOLOGY: No anemia, easy bruising or bleeding SKIN: No rash or lesion. MUSCULOSKELETAL: No joint pain or arthritis.   NEUROLOGIC: No tingling, numbness, weakness.  PSYCHIATRY: No anxiety or depression.   DRUG ALLERGIES:   Allergies  Allergen Reactions  . Cephalosporins Anaphylaxis    Patient has tolerated meropenem.   [name] Penicillins Anaphylaxis and Other (See Comments)    Has patient had a PCN reaction causing immediate rash, facial/tongue/throat swelling, SOB or lightheadedness with hypotension: Yes Has patient had a PCN reaction causing severe rash involving mucus membranes or skin necrosis: No Has patient had a PCN reaction that required hospitalization No Has patient had a PCN reaction occurring within the last 10 years: No If all of the above answers are "NO", then may proceed with Cephalosporin use.  . Lamictal [Lamotrigine] Other (See Comments)    Reaction:  Hallucinations  . Phenergan [Promethazine Hcl] Nausea And Vomiting  . Pravastatin Other (See Comments)    Reaction:  Muscle pain   . Sulfa Antibiotics Other (See Comments)    Reaction:  Unknown     VITALS:  Blood pressure (!) 161/61, pulse 67, temperature 97.9 F (36.6  C), temperature source Oral, resp. rate 12, height 5\' 9"  (1.753 m), weight 109.2 kg (240 lb 11.9 oz), last menstrual period 03/16/2015, SpO2 95 %.  PHYSICAL EXAMINATION:  GENERAL:  50 y.o.-year-old patient lying in the bed with no acute distress.  EYES: Pupils equal, round, reactive to light and accommodation. No scleral icterus. Extraocular muscles intact.  HEENT: Head atraumatic, normocephalic. Oropharynx and nasopharynx clear.  NECK:  Supple, no jugular venous distention. No thyroid enlargement, no tenderness.  LUNGS: Normal breath sounds bilaterally, no wheezing, rales,rhonchi or crepitation. No use of accessory muscles of respiration.  CARDIOVASCULAR: S1, S2 normal. No murmurs, rubs, or gallops.  ABDOMEN: Soft,  lower abdominal tenderness is present but no rebound tenderness. Not distended. Bowel sounds present. No organomegaly or mass.  EXTREMITIES: No pedal edema, cyanosis, or clubbing.  NEUROLOGIC: Cranial nerves II through XII are intact. Muscle strength 5/5 in all extremities. Sensation intact. Gait not checked.  PSYCHIATRIC: The patient is alert and oriented x 3.  SKIN: No obvious rash, lesion, or ulcer.    LABORATORY PANEL:   CBC  Recent Labs Lab 03/14/16 0742  WBC 6.5  HGB 11.7*  HCT 36.1  PLT 274   ------------------------------------------------------------------------------------------------------------------  Chemistries   Recent Labs Lab 03/13/16 1612 03/14/16 0742  NA 133* 134*  K 3.3* 3.4*  CL 98* 99*  CO2 28 26  GLUCOSE 118* 91  BUN 32* 37*  CREATININE 3.95* 4.65*  CALCIUM 8.5* 8.4*  AST 12*  --   ALT 8*  --   ALKPHOS 73  --   BILITOT 0.5  --    ------------------------------------------------------------------------------------------------------------------  Cardiac Enzymes  Recent Labs  Lab 03/14/16 1301  TROPONINI 0.06*    ------------------------------------------------------------------------------------------------------------------  RADIOLOGY:  Ct Abdomen Pelvis Wo Contrast  Result Date: 03/13/2016 CLINICAL DATA:  50 year old female with a history of lower abdominal pain for 1 day EXAM: CT ABDOMEN AND PELVIS WITHOUT CONTRAST TECHNIQUE: Multidetector CT imaging of the abdomen and pelvis was performed following the standard protocol without IV contrast. COMPARISON:  Chest x-ray 03/13/2016, CT 07/15/2015 FINDINGS: Lower chest: Small left pleural effusion with associated atelectasis. Calcifications of native coronary vasculature Hepatobiliary: No focal liver abnormality is seen. No gallstones, gallbladder wall thickening, or biliary dilatation.Unremarkable gallbladder. Pancreas: Atrophic pancreatic parenchyma. Spleen: Normal in size without focal abnormality. Adrenals/Urinary Tract: Right adrenal gland unremarkable.  Left adrenal gland unremarkable. Right kidney: No right-sided hydronephrosis.  Vascular calcifications. Left Kidney: No left-sided hydronephrosis or nephrolithiasis. No significant stranding. Course of the bilateral ureters unremarkable. Unremarkable appearance of the urinary bladder. Stomach/Bowel: Stomach partially distended with enteric contrast. Small hiatal hernia. Unremarkable appearance of small bowel. No abnormally distended small bowel. Normal appendix, although high density contents within the lumen. No associated inflammation. Circumferential wall thickening of the distal sigmoid colon and rectum extending nearly to the anal verge. There is associated infiltration/ edema within the adjacent fat. Small volume formed stool within the rectum. More proximal colon is relatively decompressed without evidence of transition point or obstruction. Colonic diverticular present without changes to suggest acute diverticulitis. Vascular/Lymphatic: Dense calcifications of the abdominal aorta and the mesenteric  vessels. Calcifications extend into the iliac vessels, proximal femoral vessels, and the pelvic vasculature. There has been interval development of borderline enlarged lymph nodes in the lymphatic drainage pathway of the rectum through the IMA/IMV vasculature. Largest index lymph node at the level of the sacral base, new from the prior, measuring 12 mm. Reproductive: Gas within the vagina extending to the cervix with complex debris of unknown etiology. Unremarkable uterus and adnexa. Other: Extensive infiltration of the body wall with edema/ anasarca. Interval removal of the peritoneal dialysis catheter. High density material anterior to the right abdominal wall musculature measuring 60- 70 Hounsfield units. Musculoskeletal: Degenerative changes of the visualized spine. Endplate irregularity at inferior L3, superior L4, which was present on the comparison CT. Most likely these represent Schmorl nodes. Degenerative changes of the bilateral hips. IMPRESSION: Circumferential inflammatory changes/ thickening of the rectum, with associated edema/ inflammation of the adjacent fat. Findings are most suggestive of colitis, potentially inflammatory/infectious etiology. Recommend correlation with patient presentation and lab values. Compared to the CT of April 2017, there is new lymphadenopathy in the lymphatic drainage of the rectum, following the IMV/IMA vasculature. Index lymph node at the sacral base measures 12 mm. While this is most likely reactive inflammatory adenopathy, referral for colonoscopy is recommended, as an occult colon cancer could have this appearance/presentation. No evidence of colonic obstruction or fecal impaction. Extensive body wall anasarca/ edema. Recommend correlation with patient's fluid status. There is associated small left basilar pleural effusion. Interval removal of peritoneal dialysis catheter since the prior CT. High density material within the subcutaneous fat overlying the right  abdominal wall may represent blood products. Gas/ debris within the vagina extending to the cervix, of uncertain etiology. A colo-vaginal fistula is unlikely, although could have this presentation, and correlation with patient presentation may be useful. Aortic atherosclerosis.  Associated coronary artery disease. Signed, Yvone Neu. Loreta Ave, DO [facility] Radiology Electronically Signed   By: Gilmer Mor D.O.   On: 03/13/2016 19:21    EKG:   Orders placed or  performed during the hospital encounter of 03/13/16  . EKG 12-Lead  . EKG 12-Lead  . EKG 12-Lead  . EKG 12-Lead    ASSESSMENT AND PLAN:    #Colitis -Clostridium diff colitis  Discontinue IV levofloxacin and patient is started on by mouth vancomycin Diarrhea improved    # Hypertensive urgency -  Blood pressure goal less than 160/100.  No significant trending troponins  Increase hydralazine to 50 mg by mouth every 8 hours monitor and titrate Continue , amlodipine, Coreg and titrate as needed   #Type 2 diabetes mellitus (HCC) - renal/carb modified diet, sliding scale insulin with appropriate glucose checks     #CAD (coronary artery disease) - continue aspirin 81 mg, Coreg, Plavix, trend troponins as above. No active chest pain.   # ESRD on dialysis [facility](HCC) - nephrology consult for dialysis assistance    # Chronic diastolic congestive heart failure (HCC) - continue home meds    GERD - PPI   Seizures (HCC) - Wellbutrin extended release  PT evaluation-recommending skilled nursing facility follow-up with case management  All the records are reviewed and case discussed with Care Management/Social Workerr. Management plans discussed with the patient, family and they are in agreement.  CODE STATUS: fc  TOTAL TIME TAKING CARE OF THIS PATIENT: 38minutes.   POSSIBLE D/C IN  1-2DAYS, DEPENDING ON CLINICAL CONDITION.  Note: This dictation was prepared with Dragon dictation along with  smaller phrase technology. Any transcriptional errors that result from this process are unintentional.   Ramonita LabGouru, Oaklie Durrett M.D on 03/15/2016 at 4:29 PM  Between 7am to 6pm - Pager - [phone] After 6pm go to [email or web address] - password [email or web address] [facility]  [facility]  Office  [phone]  CC: Primary care physician; Jarome MatinALE, PATRICK D, MD

## 2016-03-15 NOTE — Progress Notes (Signed)
Subjective:   Patient presented from dialysis after an episode of hypotension. Patient reports abdominal pain for several days CT showed colitis.  C Diff is positive in stool Tolerated dialysis well.   Objective:  Vital signs in last 24 hours:  Temp:  [97.5 F (36.4 C)-98.1 F (36.7 C)] 97.9 F (36.6 C) (12/15 1158) Pulse Rate:  [77-87] 85 (12/15 1158) Resp:  [12-22] 12 (12/15 1158) BP: (146-193)/(61-83) 173/61 (12/15 1158) SpO2:  [91 %-98 %] 95 % (12/15 1158) Weight:  [109.2 kg (240 lb 11.9 oz)-110.3 kg (243 lb 2.7 oz)] 109.2 kg (240 lb 11.9 oz) (12/14 2030)  Weight change: -1.285 kg (-2 lb 13.3 oz) Filed Weights   03/14/16 0500 03/14/16 1647 03/14/16 2030  Weight: 109.3 kg (240 lb 14.4 oz) 110.3 kg (243 lb 2.7 oz) 109.2 kg (240 lb 11.9 oz)    Intake/Output:    Intake/Output Summary (Last 24 hours) at 03/15/16 1413 Last data filed at 03/15/16 1234  Gross per 24 hour  Intake                0 ml  Output             1000 ml  Net            -1000 ml     Physical Exam: General: No acute distress, laying in the bed  HEENT anicteric  Neck supple  Pulm/lungs  clear to auscultation  CVS/Heart No rub or gallop  Abdomen:  Mild diffuse tenderness  Extremities: 1-2+ edema  Neurologic: Alert, oriented with episodes of forgetfulness  Skin: Normal turgor  Access: permCath       Basic Metabolic Panel:   Recent Labs Lab 03/13/16 1612 03/14/16 0742  NA 133* 134*  K 3.3* 3.4*  CL 98* 99*  CO2 28 26  GLUCOSE 118* 91  BUN 32* 37*  CREATININE 3.95* 4.65*  CALCIUM 8.5* 8.4*     CBC:  Recent Labs Lab 03/13/16 1612 03/14/16 0742  WBC 6.2 6.5  NEUTROABS 4.4  --   HGB 11.4* 11.7*  HCT 35.5 36.1  MCV 84.8 85.0  PLT 308 274      Microbiology:  Recent Results (from the past 720 hour(s))  MRSA PCR Screening     Status: Abnormal   Collection Time: 02/25/16  6:40 PM  Result Value Ref Range Status   MRSA by PCR POSITIVE (A) NEGATIVE Final    Comment:         The GeneXpert MRSA Assay (FDA approved for NASAL specimens only), is one component of a comprehensive MRSA colonization surveillance program. It is not intended to diagnose MRSA infection nor to guide or monitor treatment for MRSA infections. RESULT CALLED TO, READ BACK BY AND VERIFIED WITH: MARSHA [facility] 02/25/16 @ 2118  MLK   Culture, blood (Routine X 2) w Reflex to ID Panel     Status: None   Collection Time: 02/25/16  7:26 PM  Result Value Ref Range Status   Specimen Description BLOOD RIGHT ANTECUBITAL  Final   Special Requests   Final    BOTTLES DRAWN AEROBIC AND ANAEROBIC 11 CC AEROBIC, 10 CC AEROBIC   Culture NO GROWTH 5 DAYS  Final   Report Status 03/01/2016 FINAL  Final  Culture, blood (Routine X 2) w Reflex to ID Panel     Status: None   Collection Time: 02/25/16  7:35 PM  Result Value Ref Range Status   Specimen Description BLOOD RIGHT HAND  Final  Special Requests   Final    BOTTLES DRAWN AEROBIC AND ANAEROBIC 4CC ANAEROBIC, 5CC AEROBIC   Culture NO GROWTH 5 DAYS  Final   Report Status 03/01/2016 FINAL  Final  Body fluid culture     Status: None   Collection Time: 02/26/16  9:54 AM  Result Value Ref Range Status   Specimen Description PERITONEAL  Final   Special Requests NONE  Final   Gram Stain   Final    MODERATE WBC PRESENT,BOTH PMN AND MONONUCLEAR NO ORGANISMS SEEN    Culture   Final    NO GROWTH 3 DAYS Performed at [facility]    Report Status [id number] FINAL  Final  Body fluid culture     Status: None   Collection Time: 02/27/16 12:20 PM  Result Value Ref Range Status   Specimen Description HEMODIALYSIS CATHETER  Final   Special Requests NONE  Final   Gram Stain   Final    MODERATE WBC PRESENT, PREDOMINANTLY MONONUCLEAR NO ORGANISMS SEEN    Culture   Final    No growth aerobically or anaerobically. Performed at [facility]    Report Status 03/01/2016 FINAL  Final  C difficile quick scan w PCR reflex     Status: Abnormal    Collection Time: 03/13/16 10:21 PM  Result Value Ref Range Status   C Diff antigen POSITIVE (A) NEGATIVE Final   C Diff toxin NEGATIVE NEGATIVE Final   C Diff interpretation Results are indeterminate. See PCR results.  Final  Clostridium Difficile by PCR     Status: Abnormal   Collection Time: 03/13/16 10:21 PM  Result Value Ref Range Status   Toxigenic C Difficile by pcr POSITIVE (A) NEGATIVE Final    Comment: Positive for toxigenic C. difficile with little to no toxin production. Only treat if clinical presentation suggests symptomatic illness.    Coagulation Studies: No results for input(s): LABPROT, INR in the last 72 hours.  Urinalysis:  Recent Labs  03/13/16 1912  COLORURINE YELLOW*  LABSPEC 1.014  PHURINE 5.0  GLUCOSEU 50*  HGBUR NEGATIVE  BILIRUBINUR NEGATIVE  KETONESUR NEGATIVE  PROTEINUR >=300*  NITRITE NEGATIVE  LEUKOCYTESUR TRACE*      Imaging: Ct Abdomen Pelvis Wo Contrast  Result Date: 03/13/2016 CLINICAL DATA:  50 year old female with a history of lower abdominal pain for 1 day EXAM: CT ABDOMEN AND PELVIS WITHOUT CONTRAST TECHNIQUE: Multidetector CT imaging of the abdomen and pelvis was performed following the standard protocol without IV contrast. COMPARISON:  Chest x-ray 03/13/2016, CT 07/15/2015 FINDINGS: Lower chest: Small left pleural effusion with associated atelectasis. Calcifications of native coronary vasculature Hepatobiliary: No focal liver abnormality is seen. No gallstones, gallbladder wall thickening, or biliary dilatation.Unremarkable gallbladder. Pancreas: Atrophic pancreatic parenchyma. Spleen: Normal in size without focal abnormality. Adrenals/Urinary Tract: Right adrenal gland unremarkable.  Left adrenal gland unremarkable. Right kidney: No right-sided hydronephrosis.  Vascular calcifications. Left Kidney: No left-sided hydronephrosis or nephrolithiasis. No significant stranding. Course of the bilateral ureters unremarkable. Unremarkable  appearance of the urinary bladder. Stomach/Bowel: Stomach partially distended with enteric contrast. Small hiatal hernia. Unremarkable appearance of small bowel. No abnormally distended small bowel. Normal appendix, although high density contents within the lumen. No associated inflammation. Circumferential wall thickening of the distal sigmoid colon and rectum extending nearly to the anal verge. There is associated infiltration/ edema within the adjacent fat. Small volume formed stool within the rectum. More proximal colon is relatively decompressed without evidence of transition point or obstruction. Colonic diverticular present  without changes to suggest acute diverticulitis. Vascular/Lymphatic: Dense calcifications of the abdominal aorta and the mesenteric vessels. Calcifications extend into the iliac vessels, proximal femoral vessels, and the pelvic vasculature. There has been interval development of borderline enlarged lymph nodes in the lymphatic drainage pathway of the rectum through the IMA/IMV vasculature. Largest index lymph node at the level of the sacral base, new from the prior, measuring 12 mm. Reproductive: Gas within the vagina extending to the cervix with complex debris of unknown etiology. Unremarkable uterus and adnexa. Other: Extensive infiltration of the body wall with edema/ anasarca. Interval removal of the peritoneal dialysis catheter. High density material anterior to the right abdominal wall musculature measuring 60- 70 Hounsfield units. Musculoskeletal: Degenerative changes of the visualized spine. Endplate irregularity at inferior L3, superior L4, which was present on the comparison CT. Most likely these represent Schmorl nodes. Degenerative changes of the bilateral hips. IMPRESSION: Circumferential inflammatory changes/ thickening of the rectum, with associated edema/ inflammation of the adjacent fat. Findings are most suggestive of colitis, potentially inflammatory/infectious etiology.  Recommend correlation with patient presentation and lab values. Compared to the CT of April 2017, there is new lymphadenopathy in the lymphatic drainage of the rectum, following the IMV/IMA vasculature. Index lymph node at the sacral base measures 12 mm. While this is most likely reactive inflammatory adenopathy, referral for colonoscopy is recommended, as an occult colon cancer could have this appearance/presentation. No evidence of colonic obstruction or fecal impaction. Extensive body wall anasarca/ edema. Recommend correlation with patient's fluid status. There is associated small left basilar pleural effusion. Interval removal of peritoneal dialysis catheter since the prior CT. High density material within the subcutaneous fat overlying the right abdominal wall may represent blood products. Gas/ debris within the vagina extending to the cervix, of uncertain etiology. A colo-vaginal fistula is unlikely, although could have this presentation, and correlation with patient presentation may be useful. Aortic atherosclerosis.  Associated coronary artery disease. Signed, Yvone NeuJaime S. Loreta AveWagner, DO [facility] Electronically Signed   By: Gilmer MorJaime  Wagner D.O.   On: 03/13/2016 19:21   Dg Chest Portable 1 View  Result Date: 03/13/2016 CLINICAL DATA:  Acute hypotensive episode today after dialysis. EXAM: PORTABLE CHEST 1 VIEW COMPARISON:  03/03/2016 FINDINGS: Heart size remains at the upper limits of normal. Left jugular dual-lumen central venous catheter remains in appropriate position. No evidence of pneumothorax. No evidence of congestive heart failure. Opacity is seen in the left retrocardiac lung base silhouetting the left hemidiaphragm, which may be due to atelectasis or infiltrate. IMPRESSION: Retrocardiac left lower lobe atelectasis versus infiltrate. Electronically Signed   By: Myles RosenthalJohn  Stahl M.D.   On: 03/13/2016 16:58     Medications:    . amLODipine  10  mg Oral QHS  . aspirin EC  81 mg Oral Daily  . buPROPion  150 mg Oral Daily  . calcium carbonate  1 tablet Oral TID WC  . carvedilol  6.25 mg Oral BID  . clopidogrel  75 mg Oral Daily  . gabapentin  300 mg Oral TID  . heparin  5,000 Units Subcutaneous Q8H  . hydrALAZINE  25 mg Oral Daily  . insulin aspart  0-5 Units Subcutaneous QHS  . insulin aspart  0-9 Units Subcutaneous TID WC  . isosorbide mononitrate  90 mg Oral Daily  . labetalol  5 mg Intravenous Once  . levETIRAcetam  500 mg Oral q morning - 10a  . lisinopril  10 mg Oral Daily  . metoCLOPramide  10 mg Oral QID  . pantoprazole  40 mg Oral BID  . risperiDONE  1 mg Oral TID  . sevelamer carbonate  800 mg Oral TID WC  . sevelamer carbonate  800 mg Oral QHS  . torsemide  100 mg Oral Daily  . vancomycin  125 mg Oral Q6H   acetaminophen **OR** acetaminophen, labetalol, morphine injection, ondansetron **OR** ondansetron (ZOFRAN) IV, oxyCODONE-acetaminophen  Assessment/ Plan:  50 y.o. female  with end-stage renal disease on hemodialysis, asthma, gastroparesis, diabetes mellitus type II, congestive heart failure, hypertension, endometriosis, coronary atherosclerosis, drug-eluting stent to LAD 2017  [facility] Nephrology/ [address]/ TTS   1. End-stage renal disease on hemodialysis dialysis with LE edema - will schedule for routine dialysis on Saturday  2.  Anemia of chronic kidney disease - hemoglobin 11.7, monitor  3.  Secondary hyperparathyroidism - monitor phosphorus during hospital stay  4.  C. Difficile colitis Management as per hospitalist   LOS: 2 Kalanie Fewell 12/15/20172:13 PM

## 2016-03-15 NOTE — Clinical Social Work Note (Signed)
Clinical Social Work Assessment  Patient Details  Name: Kimberly Montgomery MRN: [id number] Date of Birth: 04-22-1965  Date of referral:  03/15/16               Reason for consult:  Facility Placement                Permission sought to share information with:    Permission granted to share information::     Name::        Agency::     Relationship::     Contact Information:     Housing/Transportation Living arrangements for the past 2 months:  Skilled Building surveyor of Information:  Adult Children Patient Interpreter Needed:  None Criminal Activity/Legal Involvement Pertinent to Current Situation/Hospitalization:  No - Comment as needed Significant Relationships:  Adult Children Lives with:  Facility Resident Do you feel safe going back to the place where you live?  Yes Need for family participation in patient care:  Yes (Comment)  Care giving concerns:  Patient resides at [facility].   Social Worker assessment / plan:  CSW contacted Leisure centre manager at [employer] to let her know that patient was cdiff positive and to ask if patient would need humana reauth or if she was coming back under medicaid. [name] stated that patient has no more humana days left and would return under medicaid.   CSW was able to reach patient's daughter: Ms. Durwin Nora: [phone] and she confirmed that arrangements had been settled for patient to return to [facility] and that when she discharges she prefers she return via ambulance.   Employment status:  Disabled (Comment on whether or not currently receiving Disability) Insurance information:  Managed Medicare PT Recommendations:    Information / Referral to community resources:     Patient/Family's Response to care:  [name] expressed appreciation for CSW assistance.  Patient/Family's Understanding of and Emotional Response to Diagnosis, Current Treatment, and Prognosis:  [name] is actively involved in her mother's care and  disposition.  Emotional Assessment Appearance:  Appears stated age Attitude/Demeanor/Rapport:   (patient pleasantly confused) Affect (typically observed):  Calm, Quiet Orientation:  Oriented to Self, Oriented to Place Alcohol / Substance use:  Not Applicable Psych involvement (Current and /or in the community):  No (Comment)  Discharge Needs  Concerns to be addressed:  Care Coordination Readmission within the last 30 days:  No Current discharge risk:  None Barriers to Discharge:  No Barriers Identified   York Spaniel, LCSW 03/15/2016, 12:08 PM

## 2016-03-15 NOTE — Progress Notes (Signed)
Notified Dr. Amado Coe of elevated BP. Per MD place order for labetalol 5mg  IV 1 time and MD to place more orders

## 2016-03-16 LAB — GLUCOSE, CAPILLARY
GLUCOSE-CAPILLARY: 111 mg/dL — AB (ref 65–99)
Glucose-Capillary: 91 mg/dL (ref 65–99)

## 2016-03-16 LAB — BASIC METABOLIC PANEL
Anion gap: 4 — ABNORMAL LOW (ref 5–15)
BUN: 31 mg/dL — AB (ref 6–20)
CHLORIDE: 99 mmol/L — AB (ref 101–111)
CO2: 28 mmol/L (ref 22–32)
Calcium: 8.9 mg/dL (ref 8.9–10.3)
Creatinine, Ser: 4.06 mg/dL — ABNORMAL HIGH (ref 0.44–1.00)
GFR, EST AFRICAN AMERICAN: 14 mL/min — AB (ref >60)
GFR, EST NON AFRICAN AMERICAN: 12 mL/min — AB (ref >60)
Glucose, Bld: 96 mg/dL (ref 65–99)
Potassium: 3.8 mmol/L (ref 3.5–5.1)
SODIUM: 131 mmol/L — AB (ref 135–145)

## 2016-03-16 LAB — CBC
HEMATOCRIT: 28.4 % — AB (ref 35.0–47.0)
Hemoglobin: 9.4 g/dL — ABNORMAL LOW (ref 12.0–16.0)
MCH: 27.9 pg (ref 26.0–34.0)
MCHC: 32.9 g/dL (ref 32.0–36.0)
MCV: 84.9 fL (ref 80.0–100.0)
PLATELETS: 248 10*3/uL (ref 150–440)
RBC: 3.35 MIL/uL — AB (ref 3.80–5.20)
RDW: 16.1 % — AB (ref 11.5–14.5)
WBC: 5.4 10*3/uL (ref 3.6–11.0)

## 2016-03-16 MED ORDER — VANCOMYCIN 50 MG/ML ORAL SOLUTION: 125.0000 mg | Freq: Four times a day (QID) | ORAL | 0 refills | Status: AC

## 2016-03-16 MED ORDER — OXYCODONE-ACETAMINOPHEN 5-325 MG PO TABS: ORAL_TABLET | ORAL | 0 refills | Status: DC

## 2016-03-16 NOTE — Progress Notes (Signed)
PRE DIALYSIS ASSESSMENT 

## 2016-03-16 NOTE — Progress Notes (Signed)
Subjective:   Patient presented from dialysis after an episode of hypotension.  diagnosed with C Diff colitis   HEMODIALYSIS FLOWSHEET:  Blood Flow Rate (mL/min): 400 mL/min Arterial Pressure (mmHg): -190 mmHg Venous Pressure (mmHg): 290 mmHg Transmembrane Pressure (mmHg): 70 mmHg Ultrafiltration Rate (mL/min): 1210 mL/min Dialysate Flow Rate (mL/min): 800 ml/min Conductivity: Machine : 14 Conductivity: Machine : 14 Dialysis Fluid Bolus: Normal Saline Bolus Amount (mL): 250 mL Dialysate Change: Other (comment) (3K) Intra-Hemodialysis Comments: HD COMLETED (TOTAL UF )     Objective:  Vital signs in last 24 hours:  Temp:  [97.7 F (36.5 C)-98.7 F (37.1 C)] 98.4 F (36.9 C) (12/16 1450) Pulse Rate:  [66-91] 75 (12/16 1450) Resp:  [16-18] 18 (12/16 1450) BP: (109-196)/(46-81) 142/58 (12/16 1450) SpO2:  [94 %-97 %] 96 % (12/16 1045) Weight:  [111 kg (244 lb 11.4 oz)-111.9 kg (246 lb 11.1 oz)] 111.9 kg (246 lb 11.1 oz) (12/16 1045)  Weight change: 0.7 kg (1 lb 8.7 oz) Filed Weights   03/14/16 2030 03/16/16 0500 03/16/16 1045  Weight: 109.2 kg (240 lb 11.9 oz) 111 kg (244 lb 11.4 oz) 111.9 kg (246 lb 11.1 oz)    Intake/Output:    Intake/Output Summary (Last 24 hours) at 03/16/16 1526 Last data filed at 03/16/16 1450  Gross per 24 hour  Intake                0 ml  Output             2000 ml  Net            -2000 ml     Physical Exam: General: No acute distress, laying in the bed  HEENT anicteric  Neck supple  Pulm/lungs  clear to auscultation  CVS/Heart No rub or gallop  Abdomen:  Mild diffuse tenderness  Extremities: 1-2+ edema  Neurologic: Resting quietly  Skin: Normal turgor  Access: permCath       Basic Metabolic Panel:   Recent Labs Lab 03/13/16 1612 03/14/16 0742 03/16/16 0541  NA 133* 134* 131*  K 3.3* 3.4* 3.8  CL 98* 99* 99*  CO2 28 26 28   GLUCOSE 118* 91 96  BUN 32* 37* 31*  CREATININE 3.95* 4.65* 4.06*  CALCIUM 8.5* 8.4*  8.9     CBC:  Recent Labs Lab 03/13/16 1612 03/14/16 0742 03/16/16 0541  WBC 6.2 6.5 5.4  NEUTROABS 4.4  --   --   HGB 11.4* 11.7* 9.4*  HCT 35.5 36.1 28.4*  MCV 84.8 85.0 84.9  PLT 308 274 248      Microbiology:  Recent Results (from the past 720 hour(s))  MRSA PCR Screening     Status: Abnormal   Collection Time: 02/25/16  6:40 PM  Result Value Ref Range Status   MRSA by PCR POSITIVE (A) NEGATIVE Final    Comment:        The GeneXpert MRSA Assay (FDA approved for NASAL specimens only), is one component of a comprehensive MRSA colonization surveillance program. It is not intended to diagnose MRSA infection nor to guide or monitor treatment for MRSA infections. RESULT CALLED TO, READ BACK BY AND VERIFIED WITH: MARSHA [facility] 02/25/16 @ 2118  MLK   Culture, blood (Routine X 2) w Reflex to ID Panel     Status: None   Collection Time: 02/25/16  7:26 PM  Result Value Ref Range Status   Specimen Description BLOOD RIGHT ANTECUBITAL  Final   Special Requests   Final  BOTTLES DRAWN AEROBIC AND ANAEROBIC 11 CC AEROBIC, 10 CC AEROBIC   Culture NO GROWTH 5 DAYS  Final   Report Status 03/01/2016 FINAL  Final  Culture, blood (Routine X 2) w Reflex to ID Panel     Status: None   Collection Time: 02/25/16  7:35 PM  Result Value Ref Range Status   Specimen Description BLOOD RIGHT HAND  Final   Special Requests   Final    BOTTLES DRAWN AEROBIC AND ANAEROBIC 4CC ANAEROBIC, 5CC AEROBIC   Culture NO GROWTH 5 DAYS  Final   Report Status 03/01/2016 FINAL  Final  Body fluid culture     Status: None   Collection Time: 02/26/16  9:54 AM  Result Value Ref Range Status   Specimen Description PERITONEAL  Final   Special Requests NONE  Final   Gram Stain   Final    MODERATE WBC PRESENT,BOTH PMN AND MONONUCLEAR NO ORGANISMS SEEN    Culture   Final    NO GROWTH 3 DAYS [facility]    Report Status 02/29/2016 FINAL  Final  Body fluid culture     Status: None    Collection Time: 02/27/16 12:20 PM  Result Value Ref Range Status   Specimen Description HEMODIALYSIS CATHETER  Final   Special Requests NONE  Final   Gram Stain   Final    MODERATE WBC PRESENT, PREDOMINANTLY MONONUCLEAR NO ORGANISMS SEEN    Culture   Final    No growth aerobically or anaerobically. [facility]formed at [facility]    Report Status 03/01/2016 FINAL  Final  C difficile quick scan w PCR reflex     Status: Abnormal   Collection Time: 03/13/16 10:21 PM  Result Value Ref Range Status   C Diff antigen POSITIVE (A) NEGATIVE Final   C Diff toxin NEGATIVE NEGATIVE Final   C Diff interpretation Results are indeterminate. See PCR results.  Final  Clostridium Difficile by PCR     Status: Abnormal   Collection Time: 03/13/16 10:21 PM  Result Value Ref Range Status   Toxigenic C Difficile by pcr POSITIVE (A) NEGATIVE Final    Comment: Positive for toxigenic C. difficile with little to no toxin production. Only treat if clinical presentation suggests symptomatic illness.    Coagulation Studies: No results for input(s): LABPROT, INR in the last 72 hours.  Urinalysis:  Recent Labs  03/13/16 1912  COLORURINE YELLOW*  LABSPEC 1.014  PHURINE 5.0  GLUCOSEU 50*  HGBUR NEGATIVE  BILIRUBINUR NEGATIVE  KETONESUR NEGATIVE  PROTEINUR >=300*  NITRITE NEGATIVE  LEUKOCYTESUR TRACE*      Imaging: No results found.   Medications:    . amLODipine  10 mg Oral QHS  . aspirin EC  81 mg Oral Daily  . buPROPion  150 mg Oral Daily  . calcium carbonate  1 tablet Oral TID WC  . carvedilol  6.25 mg Oral BID  . clopidogrel  75 mg Oral Daily  . gabapentin  300 mg Oral TID  . heparin  5,000 Units Subcutaneous Q8H  . hydrALAZINE  50 mg Oral Q8H  . insulin aspart  0-5 Units Subcutaneous QHS  . insulin aspart  0-9 Units Subcutaneous TID WC  . isosorbide mononitrate  90 mg Oral Daily  . levETIRAcetam  500 mg Oral q morning - 10a  . lisinopril  10 mg Oral Daily  . metoCLOPramide   10 mg Oral QID  . pantoprazole  40 mg Oral BID  . risperiDONE  1 mg Oral TID  . sevelamer carbonate  800 mg Oral TID WC  . sevelamer carbonate  800 mg Oral QHS  . torsemide  100 mg Oral Daily  . vancomycin  125 mg Oral Q6H   acetaminophen **OR** acetaminophen, labetalol, morphine injection, ondansetron **OR** ondansetron (ZOFRAN) IV, oxyCODONE-acetaminophen  Assessment/ Plan:  50 y.o. female  with end-stage renal disease on hemodialysis, asthma, gastroparesis, diabetes mellitus type II, congestive heart failure, hypertension, endometriosis, coronary atherosclerosis, drug-eluting stent to LAD 2017  [facility]/ [address]/ TTS   1. End-stage renal disease on hemodialysis dialysis with LE edema - Patient seen during dialysis Tolerating well   2.  Anemia of chronic kidney disease - hemoglobin 9.4,  monitor  3.  Secondary hyperparathyroidism - monitor phosphorus during hospital stay  4.  C. Difficile colitis Management as per hospitalist   LOS: 3 Kimberly Montgomery 12/16/20173:26 PM

## 2016-03-16 NOTE — Clinical Social Work Note (Signed)
Patient to dc to [facility] via non emergent EMS. Facility and family are aware. CSW will con't to follow pending any additional dc needs.  Argentina Ponder, MSW, LCSW-A [phone]

## 2016-03-16 NOTE — Discharge Summary (Signed)
[facility] - [place] at [facility]   PATIENT NAME: Kimberly Montgomery    MR#:  [id number]  DATE OF BIRTH:  [birth year]  DATE OF ADMISSION:  03/13/2016 ADMITTING PHYSICIAN: Oralia Manis, MD  DATE OF DISCHARGE: 03/16/16 PRIMARY CARE PHYSICIAN: Jarome Matin, MD    ADMISSION DIAGNOSIS:  Anasarca [R60.1] Colitis [K52.9]  DISCHARGE DIAGNOSIS:  Clostridium diff colitis ESRD on HD  SECONDARY DIAGNOSIS:   Past Medical History:  Diagnosis Date  . Anginal pain (HCC)   . Anxiety   . Asthma   . Bipolar disorder (HCC)   . CAD (coronary artery disease)   . CHF (congestive heart failure) (HCC)   . Chronic lower back pain   . Depression   . Endometriosis   . ESRD (end stage renal disease) on dialysis (HCC)    "[facility]; [address]; [place]; TTS" (01/19/2016)  . Gastroparesis   . GERD (gastroesophageal reflux disease)   . History of blood transfusion "several"   "my blood would get low; low RBC"  . History of hiatal hernia   . HLD (hyperlipidemia)   . Hypertension   . Migraine    "monthly" (01/19/2016)  . Myocardial infarction 2017   "~ 3 wks ago" (01/19/2016)  . Renal disorder   . Seizures (HCC) 07/2015   "I've only had the 1; don't know what from" (01/19/2016)  . Type II diabetes mellitus [facility])     HOSPITAL COURSE:   Kimberly Montgomery  is a 50 y.o. female who presents with An episode of hypotension during hemodialysis today. Afterwards in the ED she is noted to be significantly hypertensive. She states that she used to do peritoneal dialysis, but has not been switched to hemodialysis. She also complains of abdominal pain for the past 2 weeks, which is been progressively getting worse. CT scan in the ED showed likely colitis. Hospitalists were called for admission and further evaluation and treatment. Please review history and physical for details  Hospital course  #Colitis -Clostridium diff colitis  Discontinue IV levofloxacin and patient is started on  by mouth vancomycin Diarrhea improved. Clinically doing better   #Hypertensive urgency -improved  Blood pressure goal less than 160/100.  No significant trending troponins  Increased hydralazine to 50 mg by mouth every 8 hours monitor and titrate Continue , amlodipine, Coreg and titrate as needed   #Type 2 diabetes mellitus (HCC) - renal/carb modified diet, sliding scale insulin with appropriate glucose checks   #CAD (coronary artery disease) - continue aspirin 81 mg, Coreg, Plavix, trend troponins as above. No active chest pain.  #ESRD on dialysis [facility]) - nephrology consult for dialysis assistance   #Chronic diastolic congestive heart failure (HCC) - continue home meds  GERD - PPI Seizures (HCC) - Wellbutrin extended release  PT evaluation-recommending skilled nursing facility  Discharge to long-term skilled nursing facility  DISCHARGE CONDITIONS:   STABLE  CONSULTS OBTAINED:     PROCEDURES  NONE  DRUG ALLERGIES:   Allergies  Allergen Reactions  . Cephalosporins Anaphylaxis    Patient has tolerated meropenem.   [name] Penicillins Anaphylaxis and Other (See Comments)    Has patient had a PCN reaction causing immediate rash, facial/tongue/throat swelling, SOB or lightheadedness with hypotension: Yes Has patient had a PCN reaction causing severe rash involving mucus membranes or skin necrosis: No Has patient had a PCN reaction that required hospitalization No Has patient had a PCN reaction occurring within the last 10 years: No If all of the above answers are "NO", then  may proceed with Cephalosporin use.  . Lamictal [Lamotrigine] Other (See Comments)    Reaction:  Hallucinations  . Phenergan [Promethazine Hcl] Nausea And Vomiting  . Pravastatin Other (See Comments)    Reaction:  Muscle pain   . Sulfa Antibiotics Other (See Comments)    Reaction:  Unknown     DISCHARGE MEDICATIONS:   Current Discharge Medication List    START taking these  medications   Details  vancomycin (VANCOCIN) 50 mg/mL oral solution Take 2.5 mLs (125 mg total) by mouth every 6 (six) hours. Qty: 150 mL, Refills: 0      CONTINUE these medications which have CHANGED   Details  oxyCODONE-acetaminophen (PERCOCET/ROXICET) 5-325 MG tablet Take one tablet by mouth every 6 hours as needed for moderate pain. Do not exceed 4gm of Tylenol in 24 hours Qty: 20 tablet, Refills: 0      CONTINUE these medications which have NOT CHANGED   Details  amLODipine (NORVASC) 10 MG tablet Take 10 mg by mouth daily.    aspirin EC 81 MG tablet Take 81 mg by mouth daily.    buPROPion (WELLBUTRIN XL) 150 MG 24 hr tablet Take 150 mg by mouth daily.    calcium carbonate (TUMS - DOSED IN MG ELEMENTAL CALCIUM) 500 MG chewable tablet Chew 1 tablet (200 mg of elemental calcium total) by mouth 3 (three) times daily with meals. Qty: 120 tablet, Refills: 0    carvedilol (COREG) 6.25 MG tablet Take 6.25 mg by mouth 2 (two) times daily.    clopidogrel (PLAVIX) 75 MG tablet Take 75 mg by mouth daily.    fluticasone (FLONASE) 50 MCG/ACT nasal spray Place 1 spray into both nostrils daily.    gabapentin (NEURONTIN) 300 MG capsule Take 300 mg by mouth 3 (three) times daily.    hydrALAZINE (APRESOLINE) 25 MG tablet Take 25 mg by mouth daily.     insulin aspart (NOVOLOG) 100 UNIT/ML injection Inject 10 Units into the skin 3 (three) times daily with meals. Take 10 units with meals, additional 2 units sliding scale.    insulin glargine (LANTUS) 100 UNIT/ML injection Inject 0.08 mLs (8 Units total) into the skin at bedtime. Qty: 10 mL, Refills: 11    isosorbide mononitrate (IMDUR) 60 MG 24 hr tablet Take 1.5 tablets (90 mg total) by mouth daily. Qty: 30 tablet, Refills: 00    lactulose (CHRONULAC) 10 GM/15ML solution Take 10 g by mouth daily.     lisinopril (PRINIVIL,ZESTRIL) 10 MG tablet Take 1 tablet (10 mg total) by mouth daily.    metoCLOPramide (REGLAN) 10 MG tablet Take 10 mg  by mouth 4 (four) times daily.    metoprolol (LOPRESSOR) 50 MG tablet Take 1 tablet (50 mg total) by mouth 2 (two) times daily.    nitroGLYCERIN (NITROSTAT) 0.4 MG SL tablet Place 0.4 mg under the tongue every 5 (five) minutes as needed for chest pain. Reported on 07/10/2015    omeprazole (PRILOSEC) 20 MG capsule Take 20 mg by mouth 2 (two) times daily before a meal.     ondansetron (ZOFRAN ODT) 4 MG disintegrating tablet Take 1 tablet (4 mg total) by mouth every 6 (six) hours as needed for nausea or vomiting. Qty: 20 tablet, Refills: 0    polyethylene glycol (MIRALAX / GLYCOLAX) packet Take 17 g by mouth daily. Qty: 14 each, Refills: 0    risperiDONE (RISPERDAL) 1 MG tablet Take 1 mg by mouth 3 (three) times daily.    senna (SENOKOT) 8.6 MG TABS tablet  Take 2 tablets by mouth 2 (two) times daily.     sevelamer carbonate (RENVELA) 800 MG tablet Take 800 mg by mouth 4 (four) times daily. Take 3 times with meals and 1 with snacks.    torsemide (DEMADEX) 100 MG tablet Take 100 mg by mouth daily.    Amino Acids-Protein Hydrolys (FEEDING SUPPLEMENT, PRO-STAT SUGAR FREE 64,) LIQD Take 30 mLs by mouth 3 (three) times daily with meals.    levETIRAcetam (KEPPRA) 500 MG tablet Take 1 tablet (500 mg total) by mouth every morning. Qty: 30 tablet, Refills: 0    multivitamin (RENA-VIT) TABS tablet Take 1 tablet by mouth daily.    mupirocin ointment (BACTROBAN) 2 % Place 1 application into the nose 2 (two) times daily. Qty: 22 g, Refills: 0         DISCHARGE INSTRUCTIONS:   Follow-up with primary care physician at the facility in 3-4 days Continue hemodialysis   DIET:  Renal diet  DISCHARGE CONDITION:  Stable  ACTIVITY:  Activity as tolerated  OXYGEN:  Home Oxygen: No.   Oxygen Delivery: room air  DISCHARGE LOCATION:  nursing home   If you experience worsening of your admission symptoms, develop shortness of breath, life threatening emergency, suicidal or homicidal thoughts  you must seek medical attention immediately by calling 911 or calling your MD immediately  if symptoms less severe.  You Must read complete instructions/literature along with all the possible adverse reactions/side effects for all the Medicines you take and that have been prescribed to you. Take any new Medicines after you have completely understood and accpet all the possible adverse reactions/side effects.   Please note  You were cared for by a hospitalist during your hospital stay. If you have any questions about your discharge medications or the care you received while you were in the hospital after you are discharged, you can call the unit and asked to speak with the hospitalist on call if the hospitalist that took care of you is not available. Once you are discharged, your primary care physician will handle any further medical issues. Please note that NO REFILLS for any discharge medications will be authorized once you are discharged, as it is imperative that you return to your primary care physician (or establish a relationship with a primary care physician if you do not have one) for your aftercare needs so that they can reassess your need for medications and monitor your lab values.     Today  Chief Complaint  Patient presents with  . Hypotension   Patient's diarrhea resolved. Denies any abdominal discomfort. Blood pressure improved. Getting hemodialysis today  ROS:  CONSTITUTIONAL: Denies fevers, chills. Denies any fatigue, weakness.  EYES: Denies blurry vision, double vision, eye pain. EARS, NOSE, THROAT: Denies tinnitus, ear pain, hearing loss. RESPIRATORY: Denies cough, wheeze, shortness of breath.  CARDIOVASCULAR: Denies chest pain, palpitations, edema.  GASTROINTESTINAL: Denies nausea, vomiting, diarrhea, abdominal pain. Denies bright red blood per rectum. GENITOURINARY: Denies dysuria, hematuria. ENDOCRINE: Denies nocturia or thyroid problems. HEMATOLOGIC AND LYMPHATIC:  Denies easy bruising or bleeding. SKIN: Denies rash or lesion. MUSCULOSKELETAL: Denies pain in neck, back, shoulder, knees, hips or arthritic symptoms.  NEUROLOGIC: Denies paralysis, paresthesias.  PSYCHIATRIC: Denies anxiety or depressive symptoms.   VITAL SIGNS:  Blood pressure (!) 118/49, pulse 68, temperature 97.7 F (36.5 C), temperature source Oral, resp. rate 18, height 5\' 9"  (1.753 m), weight 111.9 kg (246 lb 11.1 oz), last menstrual period 03/16/2015, SpO2 96 %.  I/O:  Intake/Output Summary (Last 24 hours) at 03/16/16 1337 Last data filed at 03/16/16 1200  Gross per 24 hour  Intake                0 ml  Output                0 ml  Net                0 ml    PHYSICAL EXAMINATION:  GENERAL:  50 y.o.-year-old patient lying in the bed with no acute distress.  EYES: Pupils equal, round, reactive to light and accommodation. No scleral icterus. Extraocular muscles intact.  HEENT: Head atraumatic, normocephalic. Oropharynx and nasopharynx clear.  NECK:  Supple, no jugular venous distention. No thyroid enlargement, no tenderness.  LUNGS: Normal breath sounds bilaterally, no wheezing, rales,rhonchi or crepitation. No use of accessory muscles of respiration.  CARDIOVASCULAR: S1, S2 normal. No murmurs, rubs, or gallops.  ABDOMEN: Soft, non-tender, non-distended. Bowel sounds present. No organomegaly or mass.  EXTREMITIES: No pedal edema, cyanosis, or clubbing.  NEUROLOGIC: Cranial nerves II through XII are intact. Muscle strength 5/5 in all extremities. Sensation intact. Gait not checked.  PSYCHIATRIC: The patient is alert and oriented x 3.  SKIN: No obvious rash, lesion, or ulcer.   DATA REVIEW:   CBC  Recent Labs Lab 03/16/16 0541  WBC 5.4  HGB 9.4*  HCT 28.4*  PLT 248    Chemistries   Recent Labs Lab 03/13/16 1612  03/16/16 0541  NA 133*  < > 131*  K 3.3*  < > 3.8  CL 98*  < > 99*  CO2 28  < > 28  GLUCOSE 118*  < > 96  BUN 32*  < > 31*  CREATININE 3.95*  <  > 4.06*  CALCIUM 8.5*  < > 8.9  AST 12*  --   --   ALT 8*  --   --   ALKPHOS 73  --   --   BILITOT 0.5  --   --   < > = values in this interval not displayed.  Cardiac Enzymes  Recent Labs Lab 03/14/16 1301  TROPONINI 0.06*    Microbiology Results  Results for orders placed or performed during the hospital encounter of 03/13/16  C difficile quick scan w PCR reflex     Status: Abnormal   Collection Time: 03/13/16 10:21 PM  Result Value Ref Range Status   C Diff antigen POSITIVE (A) NEGATIVE Final   C Diff toxin NEGATIVE NEGATIVE Final   C Diff interpretation Results are indeterminate. See PCR results.  Final  Clostridium Difficile by PCR     Status: Abnormal   Collection Time: 03/13/16 10:21 PM  Result Value Ref Range Status   Toxigenic C Difficile by pcr POSITIVE (A) NEGATIVE Final    Comment: Positive for toxigenic C. difficile with little to no toxin production. Only treat if clinical presentation suggests symptomatic illness.    RADIOLOGY:  Ct Abdomen Pelvis Wo Contrast  Result Date: 03/13/2016 CLINICAL DATA:  50 year old female with a history of lower abdominal pain for 1 day EXAM: CT ABDOMEN AND PELVIS WITHOUT CONTRAST TECHNIQUE: Multidetector CT imaging of the abdomen and pelvis was performed following the standard protocol without IV contrast. COMPARISON:  Chest x-ray 03/13/2016, CT 07/15/2015 FINDINGS: Lower chest: Small left pleural effusion with associated atelectasis. Calcifications of native coronary vasculature Hepatobiliary: No focal liver abnormality is seen. No gallstones, gallbladder wall thickening, or biliary dilatation.Unremarkable gallbladder. Pancreas: Atrophic  pancreatic parenchyma. Spleen: Normal in size without focal abnormality. Adrenals/Urinary Tract: Right adrenal gland unremarkable.  Left adrenal gland unremarkable. Right kidney: No right-sided hydronephrosis.  Vascular calcifications. Left Kidney: No left-sided hydronephrosis or nephrolithiasis. No  significant stranding. Course of the bilateral ureters unremarkable. Unremarkable appearance of the urinary bladder. Stomach/Bowel: Stomach partially distended with enteric contrast. Small hiatal hernia. Unremarkable appearance of small bowel. No abnormally distended small bowel. Normal appendix, although high density contents within the lumen. No associated inflammation. Circumferential wall thickening of the distal sigmoid colon and rectum extending nearly to the anal verge. There is associated infiltration/ edema within the adjacent fat. Small volume formed stool within the rectum. More proximal colon is relatively decompressed without evidence of transition point or obstruction. Colonic diverticular present without changes to suggest acute diverticulitis. Vascular/Lymphatic: Dense calcifications of the abdominal aorta and the mesenteric vessels. Calcifications extend into the iliac vessels, proximal femoral vessels, and the pelvic vasculature. There has been interval development of borderline enlarged lymph nodes in the lymphatic drainage pathway of the rectum through the IMA/IMV vasculature. Largest index lymph node at the level of the sacral base, new from the prior, measuring 12 mm. Reproductive: Gas within the vagina extending to the cervix with complex debris of unknown etiology. Unremarkable uterus and adnexa. Other: Extensive infiltration of the body wall with edema/ anasarca. Interval removal of the peritoneal dialysis catheter. High density material anterior to the right abdominal wall musculature measuring 60- 70 Hounsfield units. Musculoskeletal: Degenerative changes of the visualized spine. Endplate irregularity at inferior L3, superior L4, which was present on the comparison CT. Most likely these represent Schmorl nodes. Degenerative changes of the bilateral hips. IMPRESSION: Circumferential inflammatory changes/ thickening of the rectum, with associated edema/ inflammation of the adjacent fat.  Findings are most suggestive of colitis, potentially inflammatory/infectious etiology. Recommend correlation with patient presentation and lab values. Compared to the CT of April 2017, there is new lymphadenopathy in the lymphatic drainage of the rectum, following the IMV/IMA vasculature. Index lymph node at the sacral base measures 12 mm. While this is most likely reactive inflammatory adenopathy, referral for colonoscopy is recommended, as an occult colon cancer could have this appearance/presentation. No evidence of colonic obstruction or fecal impaction. Extensive body wall anasarca/ edema. Recommend correlation with patient's fluid status. There is associated small left basilar pleural effusion. Interval removal of peritoneal dialysis catheter since the prior CT. High density material within the subcutaneous fat overlying the right abdominal wall may represent blood products. Gas/ debris within the vagina extending to the cervix, of uncertain etiology. A colo-vaginal fistula is unlikely, although could have this presentation, and correlation with patient presentation may be useful. Aortic atherosclerosis.  Associated coronary artery disease. Signed, Yvone Neu. Loreta Ave, DO [facility] Radiology Electronically Signed   By: Gilmer Mor D.O.   On: 03/13/2016 19:21   Dg Chest Portable 1 View  Result Date: 03/13/2016 CLINICAL DATA:  Acute hypotensive episode today after dialysis. EXAM: PORTABLE CHEST 1 VIEW COMPARISON:  03/03/2016 FINDINGS: Heart size remains at the upper limits of normal. Left jugular dual-lumen central venous catheter remains in appropriate position. No evidence of pneumothorax. No evidence of congestive heart failure. Opacity is seen in the left retrocardiac lung base silhouetting the left hemidiaphragm, which may be due to atelectasis or infiltrate. IMPRESSION: Retrocardiac left lower lobe atelectasis versus infiltrate. Electronically Signed    By: Myles Rosenthal M.D.   On: 03/13/2016 16:58    EKG:   Orders placed or  performed during the hospital encounter of 03/13/16  . EKG 12-Lead  . EKG 12-Lead  . EKG 12-Lead  . EKG 12-Lead      Management plans discussed with the patient, family and they are in agreement.  CODE STATUS:     Code Status Orders        Start     Ordered   03/13/16 2237  Full code  Continuous     03/13/16 2236    Code Status History    Date Active Date Inactive Code Status Order ID Comments User Context   02/25/2016  5:58 PM 02/27/2016 11:32 PM Full Code [id number]  Shaune Pollack, MD Inpatient   01/19/2016  4:54 AM 01/20/2016  9:03 PM Full Code [id number]  Hillary Bow, DO ED   12/25/2015  8:01 PM 01/04/2016  8:24 PM Full Code [id number]  Houston Siren, MD Inpatient   11/19/2015  1:17 AM 11/21/2015  7:56 PM Full Code [id number]  Tonye Royalty, DO Inpatient   10/06/2015  2:08 AM 10/06/2015  6:32 PM Full Code [id number]  Ihor Austin, MD Inpatient   09/13/2015  2:14 AM 09/15/2015  5:28 PM Full Code [id number]  Arnaldo Natal, MD Inpatient   08/24/2015  3:50 AM 08/31/2015  6:47 PM Full Code [id number]  Ihor Austin, MD Inpatient   07/16/2015  6:26 AM 08/02/2015  6:48 PM Full Code [id number]  Ihor Austin, MD ED   07/10/2015  1:49 AM 07/12/2015  1:01 PM Full Code [id number]  Oralia Manis, MD Inpatient   05/12/2015  3:46 PM 05/16/2015  5:35 PM Full Code [id number]  Auburn Bilberry, MD ED      TOTAL TIME TAKING CARE OF THIS PATIENT: 45  minutes.   Note: This dictation was prepared with Dragon dictation along with smaller phrase technology. Any transcriptional errors that result from this process are unintentional.   @[facility] @  on 03/16/2016 at 1:37 PM  Between 7am to 6pm - Pager - [phone]  After 6pm go to [email or web address] - password [email or web address] [facility]  [place] [facility]  Office  [phone]  CC: Primary care physician; Jarome Matin, MD

## 2016-03-16 NOTE — Progress Notes (Signed)
HD COMPLETED  

## 2016-03-16 NOTE — Progress Notes (Signed)
POST DIALYSIS ASSESSMENT 

## 2016-03-16 NOTE — Progress Notes (Signed)
Pt d/c to [facility]; report called to Burnadette Peter, LPN, at [facility]; d/c instructions reviewed w/ pt; pt understanding was verbalized; IV removed, catheter in tact, gauze dressing applied; all pt questions answered; EMS notified for transfer

## 2016-03-16 NOTE — Progress Notes (Signed)
Pt left unit via EMS. A/O, no acute distress at d/c; pt verbalized that all belongings accounted for

## 2016-03-16 NOTE — Progress Notes (Signed)
HD STARTED  

## 2016-03-18 ENCOUNTER — Ambulatory Visit (INDEPENDENT_AMBULATORY_CARE_PROVIDER_SITE_OTHER): Payer: Medicare HMO | Admitting: Vascular Surgery

## 2016-03-18 ENCOUNTER — Non-Acute Institutional Stay (SKILLED_NURSING_FACILITY): Payer: Medicare HMO | Admitting: Internal Medicine

## 2016-03-18 ENCOUNTER — Encounter: Payer: Self-pay | Admitting: Internal Medicine

## 2016-03-18 DIAGNOSIS — R5381 Other malaise: Secondary | ICD-10-CM | POA: Diagnosis not present

## 2016-03-18 DIAGNOSIS — R110 Nausea: Secondary | ICD-10-CM | POA: Diagnosis not present

## 2016-03-18 DIAGNOSIS — G8929 Other chronic pain: Secondary | ICD-10-CM

## 2016-03-18 DIAGNOSIS — M545 Low back pain, unspecified: Secondary | ICD-10-CM

## 2016-03-18 DIAGNOSIS — I10 Essential (primary) hypertension: Secondary | ICD-10-CM

## 2016-03-18 DIAGNOSIS — G990 Autonomic neuropathy in diseases classified elsewhere: Secondary | ICD-10-CM

## 2016-03-18 DIAGNOSIS — E1122 Type 2 diabetes mellitus with diabetic chronic kidney disease: Secondary | ICD-10-CM | POA: Diagnosis not present

## 2016-03-18 DIAGNOSIS — K5909 Other constipation: Secondary | ICD-10-CM

## 2016-03-18 DIAGNOSIS — R569 Unspecified convulsions: Secondary | ICD-10-CM | POA: Diagnosis not present

## 2016-03-18 DIAGNOSIS — I25119 Atherosclerotic heart disease of native coronary artery with unspecified angina pectoris: Secondary | ICD-10-CM | POA: Diagnosis not present

## 2016-03-18 DIAGNOSIS — I5032 Chronic diastolic (congestive) heart failure: Secondary | ICD-10-CM

## 2016-03-18 DIAGNOSIS — N186 End stage renal disease: Secondary | ICD-10-CM

## 2016-03-18 DIAGNOSIS — F319 Bipolar disorder, unspecified: Secondary | ICD-10-CM

## 2016-03-18 DIAGNOSIS — E889 Metabolic disorder, unspecified: Secondary | ICD-10-CM

## 2016-03-18 DIAGNOSIS — A0472 Enterocolitis due to Clostridium difficile, not specified as recurrent: Secondary | ICD-10-CM

## 2016-03-18 DIAGNOSIS — G63 Polyneuropathy in diseases classified elsewhere: Secondary | ICD-10-CM

## 2016-03-18 DIAGNOSIS — K219 Gastro-esophageal reflux disease without esophagitis: Secondary | ICD-10-CM

## 2016-03-18 DIAGNOSIS — Z992 Dependence on renal dialysis: Secondary | ICD-10-CM

## 2016-03-18 NOTE — Progress Notes (Signed)
LOCATION: [facility]   PCP: Jarome Matin, MD   Code Status: Full Code   Goals of care: Advanced Directive information Advanced Directives 03/13/2016  Does Patient Have a Medical Advance Directive? No  Would patient like information on creating a medical advance directive? No - Patient declined       Extended Emergency Contact Information Primary Emergency Contact: Dixon,Heather N Address: [address]          [place], [place] [address] [place] Phone: [phone] Relation: Daughter   Allergies  Allergen Reactions  . Cephalosporins Anaphylaxis    Patient has tolerated meropenem.   [name] Penicillins Anaphylaxis and Other (See Comments)    Has patient had a PCN reaction causing immediate rash, facial/tongue/throat swelling, SOB or lightheadedness with hypotension: Yes Has patient had a PCN reaction causing severe rash involving mucus membranes or skin necrosis: No Has patient had a PCN reaction that required hospitalization No Has patient had a PCN reaction occurring within the last 10 years: No If all of the above answers are "NO", then may proceed with Cephalosporin use.  . Lamictal [Lamotrigine] Other (See Comments)    Reaction:  Hallucinations  . Phenergan [Promethazine Hcl] Nausea And Vomiting  . Pravastatin Other (See Comments)    Reaction:  Muscle pain   . Sulfa Antibiotics Other (See Comments)    Reaction:  Unknown     Chief Complaint  Patient presents with  . Readmit To SNF    Readmission Visit     HPI:  Patient is a 50 y.o. female seen today for Long-term care post hospital readmission from 03/13/2016-03/16/2016 with hypotension during dialysis. When she arrived in the emergency room she was found to be hypotensive. She started complaining of abdominal pain and had a CT scan of abdomen done which was suggestive of colitis. She was also having diarrhea in the hospital and had a stool sent for C. difficile PCR but came out positive.  She was started on oral vancomycin for C. difficile colitis. Her blood pressure medication was adjusted. She is a long-term care resident at this facility and has a history of end-stage renal disease on dialysis 3 days a week, coronary artery disease, type 2 diabetes mellitus, obesity among others.  Review of Systems:  Constitutional: Negative for fever, chills, diaphoresis. Energy level is slowly coming back.   HENT: Negative for headache, congestion, nasal discharge, hearing loss, sore throat, difficulty swallowing.   Eyes: Negative for blurred vision, double vision and discharge.  Respiratory: Negative for cough, shortness of breath and wheezing.   Cardiovascular: Negative for chest pain, palpitations.  Gastrointestinal: Negative for heartburn, nausea, vomiting, abdominal pain, loss of appetite. Last bowel movement was in the hospital a couple of days back. She denies having any more loose stool. Genitourinary: Negative for dysuria and flank pain.  Musculoskeletal: Negative for back pain, fall in the facility.  Skin: Negative for itching, rash.  Neurological: Negative for dizziness. Psychiatric/Behavioral: Negative for depression.  Past Medical History:  Diagnosis Date  . Anginal pain (HCC)   . Anxiety   . Asthma   . Bipolar disorder (HCC)   . CAD (coronary artery disease)   . CHF (congestive heart failure) (HCC)   . Chronic lower back pain   . Depression   . Endometriosis   . ESRD (end stage renal disease) on dialysis (HCC)    "[facility]; [address]; [place]; TTS" (01/19/2016)  . Gastroparesis   . GERD (gastroesophageal reflux  disease)   . History of blood transfusion "several"   "my blood would get low; low RBC"  . History of hiatal hernia   . HLD (hyperlipidemia)   . Hypertension   . Migraine    "monthly" (01/19/2016)  . Myocardial infarction 2017   "~ 3 wks ago" (01/19/2016)  . Renal disorder   . Seizures (HCC) 07/2015   "I've only had the 1; don't know what from"  (01/19/2016)  . Type II diabetes mellitus (HCC)    Past Surgical History:  Procedure Laterality Date  . ABDOMINAL HYSTERECTOMY     "partial"  . BELOW KNEE LEG AMPUTATION Right 2010?  [name]. CARDIAC CATHETERIZATION Right 07/10/2015   Procedure: Left Heart Cath and Coronary Angiography;  Surgeon: Laurier NancyShaukat A Khan, MD;  Location: [facility]IVE [facility];  Service: Cardiovascular;  Laterality: Right;  . CARDIAC CATHETERIZATION Right 09/14/2015   Procedure: Left Heart Cath and Coronary Angiography;  Surgeon: Laurier NancyShaukat A Khan, MD;  Location: [facility];  Service: Cardiovascular;  Laterality: Right;  . CARDIAC CATHETERIZATION N/A 09/14/2015   Procedure: Coronary Stent Intervention;  Surgeon: Alwyn Peawayne D Callwood, MD;  Location: [facility];  Service: Cardiovascular;  Laterality: N/A;  . CARDIAC CATHETERIZATION Right 11/20/2015   Procedure: Left Heart Cath and Coronary Angiography;  Surgeon: Laurier NancyShaukat A Khan, MD;  Location: [facility];  Service: Cardiovascular;  Laterality: Right;  . CORONARY ANGIOPLASTY WITH STENT PLACEMENT  <2017   @ [facility]/notes 07/03/2013  . HYSTEROTOMY    . PERIPHERAL VASCULAR CATHETERIZATION N/A 08/30/2015   Procedure: Dialysis/Perma Catheter Insertion;  Surgeon: Annice NeedyJason S Dew, MD;  Location: [facility];  Service: Cardiovascular;  Laterality: N/A;  . PERIPHERAL VASCULAR CATHETERIZATION N/A 01/01/2016   Procedure: Dialysis/Perma Catheter Insertion;  Surgeon: Annice NeedyJason S Dew, MD;  Location: [facility];  Service: Cardiovascular;  Laterality: N/A;  . PERITONEAL CATHETER INSERTION  11/03/2013   [name]/notes 11/03/2013  . PERITONEAL CATHETER REMOVAL  01/01/2016   "took the one from May out; put new PD cath in" (01/19/2016)  . PERITONEAL CATHETER REMOVAL  11/03/2013; 02/11/2014   [name]/notes 11/03/2013; Removal of tunneled catheter/notes 02/11/2014  . SALPINGOOPHORECTOMY Right    [name]/notes 07/03/2013  . TUBAL LIGATION     Social History:   reports that she quit smoking about 8  months ago. Her smoking use included Cigarettes. She smoked 0.75 packs per day. She has never used smokeless tobacco. She reports that she drinks alcohol. She reports that she does not use drugs.  Family History  Problem Relation Age of Onset  . CAD    . Diabetes    . Bipolar disorder    . Cervical cancer Mother     Medications: Allergies as of 03/18/2016      Reactions   Cephalosporins Anaphylaxis   Patient has tolerated meropenem.    Penicillins Anaphylaxis, Other (See Comments)   Has patient had a PCN reaction causing immediate rash, facial/tongue/throat swelling, SOB or lightheadedness with hypotension: Yes Has patient had a PCN reaction causing severe rash involving mucus membranes or skin necrosis: No Has patient had a PCN reaction that required hospitalization No Has patient had a PCN reaction occurring within the last 10 years: No If all of the above answers are "NO", then may proceed with Cephalosporin use.   Lamictal [lamotrigine] Other (See Comments)   Reaction:  Hallucinations   Phenergan [promethazine Hcl] Nausea And Vomiting   Pravastatin Other (See Comments)   Reaction:  Muscle pain  Sulfa Antibiotics Other (See Comments)   Reaction:  Unknown       Medication List       Accurate as of 03/18/16 12:53 PM. Always use your most recent med list.          amLODipine 10 MG tablet Commonly known as:  NORVASC Take 10 mg by mouth daily.   aspirin EC 81 MG tablet Take 81 mg by mouth daily.   buPROPion 150 MG 24 hr tablet Commonly known as:  WELLBUTRIN XL Take 150 mg by mouth daily.   calcium carbonate 500 MG chewable tablet Commonly known as:  TUMS - dosed in mg elemental calcium Chew 1 tablet (200 mg of elemental calcium total) by mouth 3 (three) times daily with meals.   carvedilol 6.25 MG tablet Commonly known as:  COREG Take 6.25 mg by mouth 2 (two) times daily.   clopidogrel 75 MG tablet Commonly known as:  PLAVIX Take 75 mg by mouth daily.     feeding supplement (PRO-STAT SUGAR FREE 64) Liqd Take 30 mLs by mouth 3 (three) times daily with meals.   fluticasone 50 MCG/ACT nasal spray Commonly known as:  FLONASE Place 1 spray into both nostrils daily.   gabapentin 300 MG capsule Commonly known as:  NEURONTIN Take 300 mg by mouth 3 (three) times daily.   hydrALAZINE 25 MG tablet Commonly known as:  APRESOLINE Take 25 mg by mouth daily.   insulin aspart 100 UNIT/ML injection Commonly known as:  novoLOG Inject 10 Units into the skin 3 (three) times daily with meals. Take 10 units with meals, additional 2 units sliding scale.   insulin glargine 100 UNIT/ML injection Commonly known as:  LANTUS Inject 0.08 mLs (8 Units total) into the skin at bedtime.   isosorbide mononitrate 60 MG 24 hr tablet Commonly known as:  IMDUR Take 1.5 tablets (90 mg total) by mouth daily.   lactulose 10 GM/15ML solution Commonly known as:  CHRONULAC Take 10 g by mouth daily.   levETIRAcetam 500 MG tablet Commonly known as:  KEPPRA Take 1 tablet (500 mg total) by mouth every morning.   lisinopril 10 MG tablet Commonly known as:  PRINIVIL,ZESTRIL Take 1 tablet (10 mg total) by mouth daily.   metoCLOPramide 10 MG tablet Commonly known as:  REGLAN Take 10 mg by mouth 4 (four) times daily.   metoprolol 50 MG tablet Commonly known as:  LOPRESSOR Take 1 tablet (50 mg total) by mouth 2 (two) times daily.   multivitamin Tabs tablet Take 1 tablet by mouth daily.   mupirocin ointment 2 % Commonly known as:  BACTROBAN Place 1 application into the nose 2 (two) times daily.   nitroGLYCERIN 0.4 MG SL tablet Commonly known as:  NITROSTAT Place 0.4 mg under the tongue every 5 (five) minutes as needed for chest pain. Reported on 07/10/2015   omeprazole 20 MG capsule Commonly known as:  PRILOSEC Take 20 mg by mouth 2 (two) times daily before a meal.   ondansetron 4 MG disintegrating tablet Commonly known as:  ZOFRAN ODT Take 1 tablet (4 mg  total) by mouth every 6 (six) hours as needed for nausea or vomiting.   oxyCODONE-acetaminophen 5-325 MG tablet Commonly known as:  PERCOCET/ROXICET Take one tablet by mouth every 6 hours as needed for moderate pain. Do not exceed 4gm of Tylenol in 24 hours   polyethylene glycol packet Commonly known as:  MIRALAX / GLYCOLAX Take 17 g by mouth daily.   risperiDONE 1 MG tablet  Commonly known as:  RISPERDAL Take 1 mg by mouth 3 (three) times daily.   senna 8.6 MG Tabs tablet Commonly known as:  SENOKOT Take 2 tablets by mouth 2 (two) times daily.   sevelamer carbonate 800 MG tablet Commonly known as:  RENVELA Take 800 mg by mouth 4 (four) times daily. Take 3 times with meals and 1 with snacks.   torsemide 100 MG tablet Commonly known as:  DEMADEX Take 100 mg by mouth daily.   vancomycin 50 mg/mL oral solution Commonly known as:  VANCOCIN Take 2.5 mLs (125 mg total) by mouth every 6 (six) hours.       Immunizations: Immunization History  Administered Date(s) Administered  . Influenza, Seasonal, Injecte, Preservative Fre 01/02/2012  . Influenza-Unspecified 02/27/2016  . PPD Test 02/27/2016  . Pneumococcal Polysaccharide-23 07/23/2011     Physical Exam: Vitals:   03/18/16 1239  BP: 138/74  Pulse: 76  Resp: 20  Temp: 97.8 F (36.6 C)  TempSrc: Oral  SpO2: 96%  Weight: 246 lb 11.2 oz (111.9 kg)  Height: 5\' 9"  (1.753 m)   Body mass index is 36.43 kg/m.  General- adult female, obese, in no acute distress Head- normocephalic, atraumatic Nose- no nasal discharge Throat- moist mucus membrane Eyes- PERRLA, EOMI, no pallor, no icterus Neck- no cervical lymphadenopathy Cardiovascular- normal s1,s2, no murmur, no leg edema Respiratory- bilateral clear to auscultation, no wheeze, no rhonchi, no crackles, no use of accessory muscles Abdomen- bowel sounds present, soft, non tender, has peritoneal dialysis catheter in place Musculoskeletal- right BKA, able to move all  other 3 extremities, generalized weakness Neurological- alert and oriented to person, place and time Skin- warm and dry Psychiatry- normal mood and affect    Labs reviewed: Basic Metabolic Panel:  Recent Labs  78/29/56 0547  07/21/15 0427  11/19/15 1547  01/04/16 1055  01/20/16 0820  02/27/16 1025  03/13/16 1612 03/14/16 0742 03/16/16 0541  NA 140  < > 128*  < >  --   < > 130*  < > 137  < >  --   < > 133* 134* 131*  K 4.7  < > 4.2  < >  --   < > 3.9  < > 3.6  < >  --   < > 3.3* 3.4* 3.8  CL 109  < > 91*  < >  --   < > 91*  < > 99*  < >  --   < > 98* 99* 99*  CO2 22  < > 22  < >  --   < > 29  < > 31  < >  --   < > 28 26 28   GLUCOSE 181*  < > 157*  < >  --   < > 147*  < > 107*  < >  --   < > 118* 91 96  BUN 64*  < > 62*  < >  --   < > 51*  < > 28*  < >  --   < > 32* 37* 31*  CREATININE 7.10*  < > 9.89*  < >  --   < > 5.58*  < > 3.91*  < >  --   < > 3.95* 4.65* 4.06*  CALCIUM 7.8*  < > 5.9*  < >  --   < > 10.5*  < > 9.2  < >  --   < > 8.5* 8.4* 8.9  MG 2.4  --  1.9  --  1.9  --   --   --   --   --   --   --   --   --   --   PHOS 8.0*  8.0*  < >  --   < >  --   < > 3.5  --  4.7*  --  4.9*  --   --   --   --   < > = values in this interval not displayed. Liver Function Tests:  Recent Labs  12/25/15 1434  01/20/16 0820 01/26/16 02/25/16 1328 03/13/16 1612  AST 17  < >  --  8* 28 12*  ALT 9*  --   --  3* 9* 8*  ALKPHOS 66  < >  --  88 70 73  BILITOT 0.5  --   --   --  0.7 0.5  PROT 5.0*  --   --   --  5.6* 5.7*  ALBUMIN 2.2*  < > 1.9*  --  2.5* 2.4*  < > = values in this interval not displayed.  Recent Labs  07/15/15 1639 08/23/15 2319 03/13/16 1612  LIPASE 23 14 15     Recent Labs  08/23/15 2319 02/25/16 1449  AMMONIA 29 29   CBC:  Recent Labs  01/19/16 0122  03/03/16 2155  03/13/16 1612 03/14/16 0742 03/16/16 0541  WBC 7.6  < > 9.6  < > 6.2 6.5 5.4  NEUTROABS 5.3  --  6.9  --  4.4  --   --   HGB 7.8*  < > 12.1  < > 11.4* 11.7* 9.4*  HCT 25.8*  < >  38.6  < > 35.5 36.1 28.4*  MCV 90.5  < > 85.4  --  84.8 85.0 84.9  PLT 448*  < > 364  < > 308 274 248  < > = values in this interval not displayed. Cardiac Enzymes:  Recent Labs  03/14/16 0116 03/14/16 0742 03/14/16 1301  TROPONINI 0.03* 0.04* 0.06*   BNP: Invalid input(s): POCBNP CBG:  Recent Labs  03/15/16 2056 03/16/16 0755 03/16/16 1204  GLUCAP 139* 91 111*    Radiological Exams: Ct Abdomen Pelvis Wo Contrast  Result Date: 03/13/2016 CLINICAL DATA:  50 year old female with a history of lower abdominal pain for 1 day EXAM: CT ABDOMEN AND PELVIS WITHOUT CONTRAST TECHNIQUE: Multidetector CT imaging of the abdomen and pelvis was performed following the standard protocol without IV contrast. COMPARISON:  Chest x-ray 03/13/2016, CT 07/15/2015 FINDINGS: Lower chest: Small left pleural effusion with associated atelectasis. Calcifications of native coronary vasculature Hepatobiliary: No focal liver abnormality is seen. No gallstones, gallbladder wall thickening, or biliary dilatation.Unremarkable gallbladder. Pancreas: Atrophic pancreatic parenchyma. Spleen: Normal in size without focal abnormality. Adrenals/Urinary Tract: Right adrenal gland unremarkable.  Left adrenal gland unremarkable. Right kidney: No right-sided hydronephrosis.  Vascular calcifications. Left Kidney: No left-sided hydronephrosis or nephrolithiasis. No significant stranding. Course of the bilateral ureters unremarkable. Unremarkable appearance of the urinary bladder. Stomach/Bowel: Stomach partially distended with enteric contrast. Small hiatal hernia. Unremarkable appearance of small bowel. No abnormally distended small bowel. Normal appendix, although high density contents within the lumen. No associated inflammation. Circumferential wall thickening of the distal sigmoid colon and rectum extending nearly to the anal verge. There is associated infiltration/ edema within the adjacent fat. Small volume formed stool within  the rectum. More proximal colon is relatively decompressed without evidence of transition point or obstruction. Colonic diverticular present without changes to suggest acute diverticulitis.  Vascular/Lymphatic: Dense calcifications of the abdominal aorta and the mesenteric vessels. Calcifications extend into the iliac vessels, proximal femoral vessels, and the pelvic vasculature. There has been interval development of borderline enlarged lymph nodes in the lymphatic drainage pathway of the rectum through the IMA/IMV vasculature. Largest index lymph node at the level of the sacral base, new from the prior, measuring 12 mm. Reproductive: Gas within the vagina extending to the cervix with complex debris of unknown etiology. Unremarkable uterus and adnexa. Other: Extensive infiltration of the body wall with edema/ anasarca. Interval removal of the peritoneal dialysis catheter. High density material anterior to the right abdominal wall musculature measuring 60- 70 Hounsfield units. Musculoskeletal: Degenerative changes of the visualized spine. Endplate irregularity at inferior L3, superior L4, which was present on the comparison CT. Most likely these represent Schmorl nodes. Degenerative changes of the bilateral hips. IMPRESSION: Circumferential inflammatory changes/ thickening of the rectum, with associated edema/ inflammation of the adjacent fat. Findings are most suggestive of colitis, potentially inflammatory/infectious etiology. Recommend correlation with patient presentation and lab values. Compared to the CT of April 2017, there is new lymphadenopathy in the lymphatic drainage of the rectum, following the IMV/IMA vasculature. Index lymph node at the sacral base measures 12 mm. While this is most likely reactive inflammatory adenopathy, referral for colonoscopy is recommended, as an occult colon cancer could have this appearance/presentation. No evidence of colonic obstruction or fecal impaction. Extensive body wall  anasarca/ edema. Recommend correlation with patient's fluid status. There is associated small left basilar pleural effusion. Interval removal of peritoneal dialysis catheter since the prior CT. High density material within the subcutaneous fat overlying the right abdominal wall may represent blood products. Gas/ debris within the vagina extending to the cervix, of uncertain etiology. A colo-vaginal fistula is unlikely, although could have this presentation, and correlation with patient presentation may be useful. Aortic atherosclerosis.  Associated coronary artery disease. Signed, Yvone Neu. Loreta Ave, DO [facility] Electronically Signed   By: Gilmer Mor D.O.   On: 03/13/2016 19:21   Dg Chest 1 View  Result Date: 02/25/2016 CLINICAL DATA:  50 year old female with 1 week history of confusion EXAM: CHEST 1 VIEW COMPARISON:  Prior chest x-ray 01/19/2016 FINDINGS: Left IJ approach tunneled hemodialysis catheter. Catheter tip projects over the distal SVC. Borderline cardiomegaly is similar compared to prior. Trace atherosclerotic calcifications again noted in the transverse aorta. No evidence of pulmonary edema. Interval improvement in the left lower lobe atelectasis and associated effusion compared to the prior radiograph. No focal airspace consolidation, effusion or pneumothorax. No acute osseous abnormality. IMPRESSION: 1. No acute cardiopulmonary process. 2. Interval resolution of small left pleural effusion and associated atelectasis compared to 01/19/2016. 3. Left IJ approach tunneled hemodialysis catheter with the tip overlying the mid SVC. Electronically Signed   By: Malachy Moan M.D.   On: 02/25/2016 14:26   Dg Chest 2 View  Result Date: 03/03/2016 CLINICAL DATA:  Acute onset of left-sided chest pain, radiating to the left scapula. Shortness of breath. Initial encounter. EXAM: CHEST  2 VIEW COMPARISON:  Chest radiograph performed 02/25/2016  FINDINGS: The lungs are well-aerated. Mild vascular congestion is noted. Small bilateral pleural effusions are seen. There is no evidence of focal opacification or pneumothorax. The heart is borderline normal in size. No acute osseous abnormalities are seen. A left IJ dual-lumen catheter is noted ending about the mid SVC. IMPRESSION: Mild vascular congestion noted. Small bilateral pleural effusions noted. Lungs otherwise grossly clear. Electronically Signed  By: Roanna Raider M.D.   On: 03/03/2016 22:58   Ct Head Wo Contrast  Result Date: 02/25/2016 CLINICAL DATA:  Altered mental status. EXAM: CT HEAD WITHOUT CONTRAST TECHNIQUE: Contiguous axial images were obtained from the base of the skull through the vertex without intravenous contrast. COMPARISON:  CT scan of December 30, 2015. FINDINGS: Brain: No evidence of acute infarction, hemorrhage, hydrocephalus, extra-axial collection or mass lesion/mass effect. Vascular: Atherosclerosis of carotid siphons is noted. Skull: Normal. Negative for fracture or focal lesion. Sinuses/Orbits: No acute finding. Other: None. IMPRESSION: No acute intracranial abnormality seen. Electronically Signed   By: Lupita Raider, M.D.   On: 02/25/2016 14:48   Dg Chest Portable 1 View  Result Date: 03/13/2016 CLINICAL DATA:  Acute hypotensive episode today after dialysis. EXAM: PORTABLE CHEST 1 VIEW COMPARISON:  03/03/2016 FINDINGS: Heart size remains at the upper limits of normal. Left jugular dual-lumen central venous catheter remains in appropriate position. No evidence of pneumothorax. No evidence of congestive heart failure. Opacity is seen in the left retrocardiac lung base silhouetting the left hemidiaphragm, which may be due to atelectasis or infiltrate. IMPRESSION: Retrocardiac left lower lobe atelectasis versus infiltrate. Electronically Signed   By: Myles Rosenthal M.D.   On: 03/13/2016 16:58    Assessment/Plan  Physical deconditioning From recent hospitalization  from C. difficile colitis. Will have her to work with physical therapy and occupational therapy to help regain strength. Fall precautions.  Clostridium difficile colitis Reports no further loose stools. Continue vancomycin 4 times a day until 03/31/16.   Hypertension Monitor blood pressure every shift for now. Continue hydralazine 25 mg daily, Norvasc 10 mg daily and Lopressor 50 mg 2 times a day. On chart review patient is also on Coreg 6.25 mg twice a day. Discontinue this.  Chronic nausea Continue Reglan 10 mg 4 times a day and prn ondansetron  Coronary artery disease Remains chest pain-free. Continue Lopressor 50 mg twice a day, isosorbide mononitrate 90 mg daily and lisinopril 10 mg daily. Continue aspirin and Plavix 75 mg daily. Continue nitroglycerin as needed.  Diastolic Congestive heart failure  chronic, stable. Continue torsemide 100 mg daily, Lopressor, lisinopril and isosorbide mononitrate.  Bipolar disorder Mood stable. Continue wellbutrin  Seizures Continue her keppra  Diabetes mellitus type 2 with renal impairment Monitor CPK. Continue current regimen of Lantus at bedtime and sliding scale insulin NovoLog with meals.  Gastroesophageal reflux disease Stable, continue omeprazole twice a day before meals.   Chronic constipation Continue her lactulose and Senokot 2 tabs twice a day with MiraLAX daily.  Peripheral neuropathy Continue Neurontin 300 mg 3 times a day and Percocet 5-325 mg every 6 hours as needed.  Chronic low back pain Continue Percocet 5-325 mg by mouth every 6 hours as needed for pain. Get PMR consult.    Goals of care: long term care   Labs/tests ordered: cbc, bmp 03/26/16  Family/ staff Communication: reviewed care plan with patient and nursing supervisor    Oneal Grout, MD Internal Medicine [facility] [address] [place], [place] [address] Cell Phone (Monday-Friday 8 am - 5 pm): [phone] On  Call: [phone] and follow prompts after 5 pm and on weekends Office Phone: [phone] Office Fax: [phone]

## 2016-03-27 LAB — CBC AND DIFFERENTIAL
HCT: 28 % — AB (ref 36–46)
Hemoglobin: 8.5 g/dL — AB (ref 12.0–16.0)
Platelets: 288 10*3/uL (ref 150–399)
WBC: 8.4 10^3/mL

## 2016-03-27 LAB — HEPATIC FUNCTION PANEL
ALK PHOS: 84 U/L (ref 25–125)
ALT: 11 U/L (ref 7–35)
AST: 10 U/L — AB (ref 13–35)
Bilirubin, Total: 0.2 mg/dL

## 2016-03-27 LAB — BASIC METABOLIC PANEL
BUN: 46 mg/dL — AB (ref 4–21)
CREATININE: 5.1 mg/dL — AB (ref 0.5–1.1)
Glucose: 156 mg/dL
POTASSIUM: 4.8 mmol/L (ref 3.4–5.3)
SODIUM: 136 mmol/L — AB (ref 137–147)

## 2016-03-29 ENCOUNTER — Non-Acute Institutional Stay (SKILLED_NURSING_FACILITY): Payer: Medicare HMO | Admitting: Family

## 2016-03-29 ENCOUNTER — Encounter: Payer: Self-pay | Admitting: Family

## 2016-03-29 DIAGNOSIS — L03311 Cellulitis of abdominal wall: Secondary | ICD-10-CM

## 2016-03-29 MED ORDER — SACCHAROMYCES BOULARDII 250 MG PO CAPS: 250.0000 mg | ORAL_CAPSULE | Freq: Two times a day (BID) | ORAL | 0 refills | Status: DC

## 2016-03-29 MED ORDER — DOXYCYCLINE HYCLATE 100 MG PO TABS: 100.0000 mg | ORAL_TABLET | Freq: Two times a day (BID) | ORAL | 0 refills | Status: DC

## 2016-03-29 NOTE — Progress Notes (Signed)
Location:  [facility] Room Number: 401-P Place of Service:  SNF (31) Provider:  Aliese Brannum FNP-C   Jarome Matin, MD  Patient Care Team: Jarome Matin, MD as PCP - General Laurier Nancy, MD as Consulting Physician (Cardiology) Mady Haagensen, MD as Consulting Physician (Internal Medicine)  Extended Emergency Contact Information Primary Emergency Contact: Dixon,Heather N Address: [address]          [place], [place] [address] [place] Mobile Phone: [phone] Relation: Daughter  Code Status:  Full Code  Goals of care: Advanced Directive information Advanced Directives 03/13/2016  Does Patient Have a Medical Advance Directive? No  Would patient like information on creating a medical advance directive? No - Patient declined     Chief Complaint  Patient presents with  . Acute Visit    right abdomen swelling    HPI:  Pt is a 50 y.o. female seen today at [facility] for an acute visit for evaluation of swelling on right abdomen. She has a significant medical history of ESRD on dialysis, HTN, CAD, CHF, GERD, Type 2 DM among others. She is seen in her room today. She complains of swelling on the previous peritoneal dialysis catheter site. Her peritoneal dialysis catheter was recently removed by [facility] in [place]. She denies any fever or chills.     Past Medical History:  Diagnosis Date  . Anginal pain (HCC)   . Anxiety   . Asthma   . Bipolar disorder (HCC)   . CAD (coronary artery disease)   . CHF (congestive heart failure) (HCC)   . Chronic lower back pain   . Depression   . Endometriosis   . ESRD (end stage renal disease) on dialysis (HCC)    "[facility]; [address]; [place]; TTS" (01/19/2016)  . Gastroparesis   . GERD (gastroesophageal reflux disease)   . History of blood transfusion "several"   "my blood would get low; low RBC"  . History of hiatal hernia   . HLD  (hyperlipidemia)   . Hypertension   . Migraine    "monthly" (01/19/2016)  . Myocardial infarction 2017   "~ 3 wks ago" (01/19/2016)  . Renal disorder   . Seizures (HCC) 07/2015   "I've only had the 1; don't know what from" (01/19/2016)  . Type II diabetes mellitus (HCC)    Past Surgical History:  Procedure Laterality Date  . ABDOMINAL HYSTERECTOMY     "partial"  . BELOW KNEE LEG AMPUTATION Right 2010?  [name] CARDIAC CATHETERIZATION Right 07/10/2015   Procedure: Left Heart Cath and Coronary Angiography;  Surgeon: Laurier Nancy, MD;  Location: [facility] INVASIVE CV LAB;  Service: Cardiovascular;  Laterality: Right;  . CARDIAC CATHETERIZATION Right 09/14/2015   Procedure: Left Heart Cath and Coronary Angiography;  Surgeon: Laurier Nancy, MD;  Location: [facility] INVASIVE CV LAB;  Service: Cardiovascular;  Laterality: Right;  . CARDIAC CATHETERIZATION N/A 09/14/2015   Procedure: Coronary Stent Intervention;  Surgeon: Alwyn Pea, MD;  Location: [facility] INVASIVE CV LAB;  Service: Cardiovascular;  Laterality: N/A;  . CARDIAC CATHETERIZATION Right 11/20/2015   Procedure: Left Heart Cath and Coronary Angiography;  Surgeon: Laurier Nancy, MD;  Location: [facility] INVASIVE CV LAB;  Service: Cardiovascular;  Laterality: Right;  . CORONARY ANGIOPLASTY WITH STENT PLACEMENT  <2017   @ [facility]/notes 07/03/2013  . HYSTEROTOMY    . PERIPHERAL VASCULAR CATHETERIZATION N/A 08/30/2015   Procedure: Dialysis/Perma Catheter  Insertion;  Surgeon: Annice Needy, MD;  Location: [facility] INVASIVE CV LAB;  Service: Cardiovascular;  Laterality: N/A;  . PERIPHERAL VASCULAR CATHETERIZATION N/A 01/01/2016   Procedure: Dialysis/Perma Catheter Insertion;  Surgeon: Annice Needy, MD;  Location: [facility] INVASIVE CV LAB;  Service: Cardiovascular;  Laterality: N/A;  . PERITONEAL CATHETER INSERTION  11/03/2013   [name] 11/03/2013  . PERITONEAL CATHETER REMOVAL  01/01/2016   "took the one from May out; put new PD cath in" (01/19/2016)  . PERITONEAL CATHETER  REMOVAL  11/03/2013; 02/11/2014   [name] 11/03/2013; Removal of tunneled catheter/notes 02/11/2014  . SALPINGOOPHORECTOMY Right    [name] 07/03/2013  . TUBAL LIGATION      Allergies  Allergen Reactions  . Cephalosporins Anaphylaxis    Patient has tolerated meropenem.   [name] Penicillins Anaphylaxis and Other (See Comments)    Has patient had a PCN reaction causing immediate rash, facial/tongue/throat swelling, SOB or lightheadedness with hypotension: Yes Has patient had a PCN reaction causing severe rash involving mucus membranes or skin necrosis: No Has patient had a PCN reaction that required hospitalization No Has patient had a PCN reaction occurring within the last 10 years: No If all of the above answers are "NO", then may proceed with Cephalosporin use.  . Lamictal [Lamotrigine] Other (See Comments)    Reaction:  Hallucinations  . Phenergan [Promethazine Hcl] Nausea And Vomiting  . Pravastatin Other (See Comments)    Reaction:  Muscle pain   . Sulfa Antibiotics Other (See Comments)    Reaction:  Unknown     Allergies as of 03/29/2016      Reactions   Cephalosporins Anaphylaxis   Patient has tolerated meropenem.    Penicillins Anaphylaxis, Other (See Comments)   Has patient had a PCN reaction causing immediate rash, facial/tongue/throat swelling, SOB or lightheadedness with hypotension: Yes Has patient had a PCN reaction causing severe rash involving mucus membranes or skin necrosis: No Has patient had a PCN reaction that required hospitalization No Has patient had a PCN reaction occurring within the last 10 years: No If all of the above answers are "NO", then may proceed with Cephalosporin use.   Lamictal [lamotrigine] Other (See Comments)   Reaction:  Hallucinations   Phenergan [promethazine Hcl] Nausea And Vomiting   Pravastatin Other (See Comments)   Reaction:  Muscle pain    Sulfa Antibiotics Other (See Comments)   Reaction:  Unknown       Medication List         Accurate as of 03/29/16  1:47 PM. Always use your most recent med list.          amLODipine 10 MG tablet Commonly known as:  NORVASC Take 10 mg by mouth daily.   aspirin EC 81 MG tablet Take 81 mg by mouth daily.   buPROPion 150 MG 24 hr tablet Commonly known as:  WELLBUTRIN XL Take 150 mg by mouth daily.   calcium carbonate 500 MG chewable tablet Commonly known as:  TUMS - dosed in mg elemental calcium Chew 1 tablet (200 mg of elemental calcium total) by mouth 3 (three) times daily with meals.   clopidogrel 75 MG tablet Commonly known as:  PLAVIX Take 75 mg by mouth daily.   doxycycline 100 MG tablet Commonly known as:  VIBRA-TABS Take 1 tablet (100 mg total) by mouth 2 (two) times daily.   feeding supplement (PRO-STAT SUGAR FREE 64) Liqd Take 30 mLs by mouth 3 (three) times daily with meals.   fluticasone 50 MCG/ACT  nasal spray Commonly known as:  FLONASE Place 1 spray into both nostrils daily.   gabapentin 300 MG capsule Commonly known as:  NEURONTIN Take 300 mg by mouth 3 (three) times daily.   hydrALAZINE 25 MG tablet Commonly known as:  APRESOLINE Take 25 mg by mouth daily.   ICY HOT LIDOCAINE PLUS MENTHOL 4-1 % Ptch Generic drug:  Lidocaine-Menthol Apply 2 patches topically daily. Apply 2 patches to lumbar region QAM, remove after 12 hours   insulin aspart 100 UNIT/ML injection Commonly known as:  novoLOG Inject 10 Units into the skin 3 (three) times daily with meals.   insulin glargine 100 UNIT/ML injection Commonly known as:  LANTUS Inject 0.08 mLs (8 Units total) into the skin at bedtime.   isosorbide mononitrate 60 MG 24 hr tablet Commonly known as:  IMDUR Take 1.5 tablets (90 mg total) by mouth daily.   lactulose 10 GM/15ML solution Commonly known as:  CHRONULAC Take 10 g by mouth daily.   levETIRAcetam 500 MG tablet Commonly known as:  KEPPRA Take 1 tablet (500 mg total) by mouth every morning.   lisinopril 10 MG tablet Commonly known  as:  PRINIVIL,ZESTRIL Take 1 tablet (10 mg total) by mouth daily.   metoCLOPramide 10 MG tablet Commonly known as:  REGLAN Take 10 mg by mouth 4 (four) times daily.   metoprolol 50 MG tablet Commonly known as:  LOPRESSOR Take 1 tablet (50 mg total) by mouth 2 (two) times daily.   multivitamin Tabs tablet Take 1 tablet by mouth daily.   mupirocin ointment 2 % Commonly known as:  BACTROBAN Place 1 application into the nose 2 (two) times daily.   nitroGLYCERIN 0.4 MG SL tablet Commonly known as:  NITROSTAT Place 0.4 mg under the tongue every 5 (five) minutes as needed for chest pain. Reported on 07/10/2015   omeprazole 20 MG capsule Commonly known as:  PRILOSEC Take 20 mg by mouth 2 (two) times daily before a meal.   ondansetron 4 MG disintegrating tablet Commonly known as:  ZOFRAN ODT Take 1 tablet (4 mg total) by mouth every 6 (six) hours as needed for nausea or vomiting.   oxyCODONE-acetaminophen 5-325 MG tablet Commonly known as:  PERCOCET/ROXICET Take one tablet by mouth every 6 hours as needed for moderate pain. Do not exceed 4gm of Tylenol in 24 hours   polyethylene glycol packet Commonly known as:  MIRALAX / GLYCOLAX Take 17 g by mouth daily.   risperiDONE 1 MG tablet Commonly known as:  RISPERDAL Take 1 mg by mouth 3 (three) times daily.   saccharomyces boulardii 250 MG capsule Commonly known as:  FLORASTOR Take 1 capsule (250 mg total) by mouth 2 (two) times daily.   senna 8.6 MG Tabs tablet Commonly known as:  SENOKOT Take 2 tablets by mouth 2 (two) times daily.   sevelamer carbonate 800 MG tablet Commonly known as:  RENVELA Take 800 mg by mouth 4 (four) times daily. Take 3 times with meals and 1 with snacks.   torsemide 100 MG tablet Commonly known as:  DEMADEX Take 100 mg by mouth daily.   vancomycin 50 mg/mL oral solution Commonly known as:  VANCOCIN Take 2.5 mLs (125 mg total) by mouth every 6 (six) hours.       Review of Systems    Constitutional: Negative for activity change, appetite change, chills, fatigue and fever.  HENT: Negative for congestion, rhinorrhea, sinus pressure, sneezing and sore throat.   Eyes: Negative.   Respiratory: Negative for  cough, shortness of breath and wheezing.   Cardiovascular: Negative for chest pain, palpitations and leg swelling.  Gastrointestinal: Negative for abdominal distention, abdominal pain, constipation, diarrhea, nausea and vomiting.  Genitourinary:       ESRD on dialysis   Musculoskeletal: Positive for gait problem.  Skin: Negative for color change, pallor and rash.  Neurological: Negative for dizziness, seizures, syncope, light-headedness and headaches.  Hematological: Does not bruise/bleed easily.  Psychiatric/Behavioral: Negative for agitation, confusion, hallucinations and sleep disturbance. The patient is not nervous/anxious.     Immunization History  Administered Date(s) Administered  . Influenza, Quadrivalent, Recombinant, Inj, Pf 01/25/2013  . Influenza, Seasonal, Injecte, Preservative Fre 01/02/2012  . Influenza-Unspecified 02/27/2016  . PPD Test 02/27/2016  . Pneumococcal Polysaccharide-23 07/23/2011  . Td 06/08/2007   Pertinent  Health Maintenance Due  Topic Date Due  . FOOT EXAM  10/03/1973  . OPHTHALMOLOGY EXAM  10/03/1973  . PAP SMEAR  10/03/1984  . MAMMOGRAM  10/03/2013  . COLONOSCOPY  10/03/2013  . HEMOGLOBIN A1C  03/14/2016  . INFLUENZA VACCINE  Completed      Vitals:   03/29/16 1232  BP: (!) 144/80  Pulse: 65  Resp: 20  Temp: 98.9 F (37.2 C)  TempSrc: Oral  SpO2: 97%  Weight: 246 lb (111.6 kg)  Height: 5\' 9"  (1.753 m)   Body mass index is 36.33 kg/m. Physical Exam  Constitutional: She is oriented to person, place, and time. She appears well-developed. No distress.  Obese   HENT:  Head: Normocephalic.  Mouth/Throat: Oropharynx is clear and moist. No oropharyngeal exudate.  Eyes: Conjunctivae and EOM are normal. Pupils are  equal, round, and reactive to light. No scleral icterus.  Neck: Normal range of motion. No JVD present. No thyromegaly present.  Cardiovascular: Normal rate, regular rhythm, normal heart sounds and intact distal pulses.  Exam reveals no gallop and no friction rub.   No murmur heard. Pulmonary/Chest: Effort normal and breath sounds normal. No respiratory distress. She has no wheezes. She has no rales.  Abdominal: Soft. Bowel sounds are normal. She exhibits no distension. There is no tenderness. There is no rebound and no guarding.  Right lower abdominal previous peritoneal site golf ball size swelling and redness noted. Site hard and tender to touch. Sutures dry clean and intact.   Musculoskeletal: She exhibits no tenderness.  Right BKA. Left leg 1 + edema.   Lymphadenopathy:    She has no cervical adenopathy.  Neurological: She is oriented to person, place, and time.  Skin: Skin is warm and dry. No rash noted. No erythema. No pallor.  Psychiatric: She has a normal mood and affect.    Labs reviewed:  Recent Labs  05/14/15 0547  07/21/15 0427  11/19/15 1547  01/04/16 1055  01/20/16 0820  02/27/16 1025  03/13/16 1612 03/14/16 0742 03/16/16 0541  NA 140  < > 128*  < >  --   < > 130*  < > 137  < >  --   < > 133* 134* 131*  K 4.7  < > 4.2  < >  --   < > 3.9  < > 3.6  < >  --   < > 3.3* 3.4* 3.8  CL 109  < > 91*  < >  --   < > 91*  < > 99*  < >  --   < > 98* 99* 99*  CO2 22  < > 22  < >  --   < >  29  < > 31  < >  --   < > 28 26 28   GLUCOSE 181*  < > 157*  < >  --   < > 147*  < > 107*  < >  --   < > 118* 91 96  BUN 64*  < > 62*  < >  --   < > 51*  < > 28*  < >  --   < > 32* 37* 31*  CREATININE 7.10*  < > 9.89*  < >  --   < > 5.58*  < > 3.91*  < >  --   < > 3.95* 4.65* 4.06*  CALCIUM 7.8*  < > 5.9*  < >  --   < > 10.5*  < > 9.2  < >  --   < > 8.5* 8.4* 8.9  MG 2.4  --  1.9  --  1.9  --   --   --   --   --   --   --   --   --   --   PHOS 8.0*  8.0*  < >  --   < >  --   < > 3.5  --  4.7*   --  4.9*  --   --   --   --   < > = values in this interval not displayed.  Recent Labs  12/25/15 1434  01/20/16 0820 01/26/16 02/25/16 1328 03/13/16 1612  AST 17  < >  --  8* 28 12*  ALT 9*  --   --  3* 9* 8*  ALKPHOS 66  < >  --  88 70 73  BILITOT 0.5  --   --   --  0.7 0.5  PROT 5.0*  --   --   --  5.6* 5.7*  ALBUMIN 2.2*  < > 1.9*  --  2.5* 2.4*  < > = values in this interval not displayed.  Recent Labs  01/19/16 0122  03/03/16 2155  03/13/16 1612 03/14/16 0742 03/16/16 0541  WBC 7.6  < > 9.6  < > 6.2 6.5 5.4  NEUTROABS 5.3  --  6.9  --  4.4  --   --   HGB 7.8*  < > 12.1  < > 11.4* 11.7* 9.4*  HCT 25.8*  < > 38.6  < > 35.5 36.1 28.4*  MCV 90.5  < > 85.4  --  84.8 85.0 84.9  PLT 448*  < > 364  < > 308 274 248  < > = values in this interval not displayed. Lab Results  Component Value Date   TSH 8.570 (H) 11/19/2015   Lab Results  Component Value Date   HGBA1C 6.2 (H) 09/13/2015   Lab Results  Component Value Date   CHOL 215 (H) 11/19/2015   HDL 63 11/19/2015   LDLCALC 115 (H) 11/19/2015   TRIG 186 (H) 11/19/2015   CHOLHDL 3.4 11/19/2015   Assessment/Plan  Cellulitis of abdominal wall previous peritoneal dialysis catheter site recently removed by [facility] in [place].Afebrile. Abdomen site red, hard and tender to touch. Sutures dry, clean and intact. Will start on Doxycycline 100 mg Tablet twice daily x 10 days. Florastor 250 mg Capsule twice daily by mouth X 10 days for prophylaxis. Will follow up 4 days if no improvement will obtain CT scan to evaluate. CBC/diff, BMP 04/02/2016. Check vital signs every shift X 5 days then resume previous  orders.    Family/ staff Communication: Reviewed plan of care with patient and facility Nurse supervisor.   Labs/tests ordered: CBC/diff, BMP 04/02/2016

## 2016-04-02 ENCOUNTER — Emergency Department
Admission: EM | Admit: 2016-04-02 | Discharge: 2016-04-02 | Disposition: A | Discharge status: Skilled Nursing Facility | Payer: Medicare HMO | Attending: Emergency Medicine | Admitting: Emergency Medicine

## 2016-04-02 ENCOUNTER — Encounter: Payer: Self-pay | Admitting: Emergency Medicine

## 2016-04-02 ENCOUNTER — Emergency Department: Payer: Medicare HMO

## 2016-04-02 DIAGNOSIS — N186 End stage renal disease: Secondary | ICD-10-CM | POA: Insufficient documentation

## 2016-04-02 DIAGNOSIS — Z7982 Long term (current) use of aspirin: Secondary | ICD-10-CM | POA: Diagnosis not present

## 2016-04-02 DIAGNOSIS — I5032 Chronic diastolic (congestive) heart failure: Secondary | ICD-10-CM | POA: Insufficient documentation

## 2016-04-02 DIAGNOSIS — Z87891 Personal history of nicotine dependence: Secondary | ICD-10-CM | POA: Diagnosis not present

## 2016-04-02 DIAGNOSIS — L03311 Cellulitis of abdominal wall: Secondary | ICD-10-CM | POA: Diagnosis not present

## 2016-04-02 DIAGNOSIS — Z794 Long term (current) use of insulin: Secondary | ICD-10-CM | POA: Insufficient documentation

## 2016-04-02 DIAGNOSIS — I2510 Atherosclerotic heart disease of native coronary artery without angina pectoris: Secondary | ICD-10-CM | POA: Diagnosis not present

## 2016-04-02 DIAGNOSIS — Z992 Dependence on renal dialysis: Secondary | ICD-10-CM | POA: Diagnosis not present

## 2016-04-02 DIAGNOSIS — E1122 Type 2 diabetes mellitus with diabetic chronic kidney disease: Secondary | ICD-10-CM | POA: Insufficient documentation

## 2016-04-02 DIAGNOSIS — R1084 Generalized abdominal pain: Secondary | ICD-10-CM | POA: Diagnosis present

## 2016-04-02 DIAGNOSIS — I132 Hypertensive heart and chronic kidney disease with heart failure and with stage 5 chronic kidney disease, or end stage renal disease: Secondary | ICD-10-CM | POA: Diagnosis not present

## 2016-04-02 DIAGNOSIS — Z79899 Other long term (current) drug therapy: Secondary | ICD-10-CM | POA: Diagnosis not present

## 2016-04-02 HISTORY — DX: Disorder of kidney and ureter, unspecified: N28.9

## 2016-04-02 LAB — BASIC METABOLIC PANEL
Anion gap: 10 (ref 5–15)
BUN: 21 mg/dL — AB (ref 6–20)
CALCIUM: 8.8 mg/dL — AB (ref 8.9–10.3)
CO2: 30 mmol/L (ref 22–32)
Chloride: 96 mmol/L — ABNORMAL LOW (ref 101–111)
Creatinine, Ser: 3.21 mg/dL — ABNORMAL HIGH (ref 0.44–1.00)
GFR calc Af Amer: 18 mL/min — ABNORMAL LOW (ref >60)
GFR, EST NON AFRICAN AMERICAN: 16 mL/min — AB (ref >60)
GLUCOSE: 160 mg/dL — AB (ref 65–99)
Potassium: 3.3 mmol/L — ABNORMAL LOW (ref 3.5–5.1)
Sodium: 136 mmol/L (ref 135–145)

## 2016-04-02 LAB — CBC
HCT: 28.5 % — ABNORMAL LOW (ref 35.0–47.0)
Hemoglobin: 9.5 g/dL — ABNORMAL LOW (ref 12.0–16.0)
MCH: 27.7 pg (ref 26.0–34.0)
MCHC: 33.4 g/dL (ref 32.0–36.0)
MCV: 83.1 fL (ref 80.0–100.0)
PLATELETS: 454 10*3/uL — AB (ref 150–440)
RBC: 3.44 MIL/uL — ABNORMAL LOW (ref 3.80–5.20)
RDW: 15.4 % — AB (ref 11.5–14.5)
WBC: 7.1 10*3/uL (ref 3.6–11.0)

## 2016-04-02 LAB — LACTIC ACID, PLASMA: Lactic Acid, Venous: 0.5 mmol/L (ref 0.5–1.9)

## 2016-04-02 MED ORDER — CLINDAMYCIN HCL 150 MG PO CAPS: 450.0000 mg | ORAL_CAPSULE | Freq: Three times a day (TID) | ORAL | 0 refills | Status: AC

## 2016-04-02 MED ORDER — CLINDAMYCIN HCL 150 MG PO CAPS
450.0000 mg | ORAL_CAPSULE | Freq: Once | ORAL | Status: AC
  Administered 2016-04-02: 450 mg via ORAL
  Filled 2016-04-02: qty 3

## 2016-04-02 MED ORDER — ONDANSETRON HCL 4 MG/2ML IJ SOLN
4.0000 mg | Freq: Once | INTRAMUSCULAR | Status: AC
  Administered 2016-04-02: 4 mg via INTRAVENOUS
  Filled 2016-04-02: qty 2

## 2016-04-02 MED ORDER — MORPHINE SULFATE (PF) 4 MG/ML IV SOLN
4.0000 mg | Freq: Once | INTRAVENOUS | Status: AC
  Administered 2016-04-02: 4 mg via INTRAVENOUS
  Filled 2016-04-02: qty 1

## 2016-04-02 MED ORDER — IOPAMIDOL (ISOVUE-300) INJECTION 61%
15.0000 mL | INTRAVENOUS | Status: AC
  Administered 2016-04-02 (×2): 15 mL via ORAL

## 2016-04-02 MED ORDER — IOPAMIDOL (ISOVUE-300) INJECTION 61%
100.0000 mL | Freq: Once | INTRAVENOUS | Status: AC | PRN
  Administered 2016-04-02: 100 mL via INTRAVENOUS

## 2016-04-02 NOTE — ED Notes (Signed)
CBC collected, sent to lab. 

## 2016-04-02 NOTE — Discharge Instructions (Signed)
Take antibiotics fully as prescribed. Return to the emergency room if the redness is extending outside the area marked, if you develop fever or any abdominal pain, vomiting, shortness of breath,or chest pain. Make sure to go to dialysis tomorrow to remove the IV contrast the repeat CT today.

## 2016-04-02 NOTE — ED Provider Notes (Signed)
[facility] Emergency Department Provider Note  ____________________________________________  Time seen: Approximately 3:27 PM  I have reviewed the triage vital signs and the nursing notes.   HISTORY  Chief Complaint Post-op Problem   HPI Kimberly Montgomery is a 51 y.o. female h/o ESRD on HD (TTS), CAD, CHF, obesity, hypertension, hyperlipidemia, diabetes presents for evaluation of the right abdominal pain. Patient reports that 2 weeks ago she had her peritoneal catheter removed from the right lower part of her abdomen by Dr.Schneir. 4 days ago she noticed pain and redness at the site of the old catheter. She was seen by home health nurse 3 days ago and was started on doxycycline. She reports that the redness is getting progressively worse and now she has diffuse lower abdominal pain that she describes as throbbing, moderate in intensity, constant, nonradiating, associated with nausea and chills. No fever, no vomiting. Patient went to dialysis today and received the full treatment. When she told the dialysis nurse to take a look at the site, the nurse called Dr. Lorretta Harp and patient was sent here for evaluation.    Past Medical History:  Diagnosis Date  . Anginal pain (HCC)   . Anxiety   . Bipolar disorder (HCC)   . CAD (coronary artery disease)   . CHF (congestive heart failure) (HCC)   . Chronic lower back pain   . Depression   . Endometriosis   . ESRD (end stage renal disease) on dialysis (HCC)    "[facility]; [address]; [place]; TTS" (01/19/2016)  . Gastroparesis   . GERD (gastroesophageal reflux disease)   . History of blood transfusion "several"   "my blood would get low; low RBC"  . History of hiatal hernia   . HLD (hyperlipidemia)   . Hypertension   . Migraine    "monthly" (01/19/2016)  . Myocardial infarction 2017   "~ 3 wks ago" (01/19/2016)  . Renal disorder   . Renal insufficiency   . Seizures (HCC) 07/2015   "I've only had the 1; don't  know what from" (01/19/2016)  . Type II diabetes mellitus [facility])     Patient Active Problem List   Diagnosis Date Noted  . Colitis 03/13/2016  . Chronic diastolic congestive heart failure (HCC) 01/22/2016  . Pressure injury of skin 01/20/2016  . Bacteremia 12/27/2015  . Essential hypertension 11/21/2015  . Anemia of chronic disease 11/21/2015  . Acute respiratory failure with hypoxia (HCC) 11/21/2015  . Acute diastolic CHF (congestive heart failure) (HCC) 11/21/2015  . ESRD on dialysis (HCC) 11/21/2015  . 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 NSTEMI (non-ST elevated myocardial infarction) (HCC) 11/20/2015  . Chest pain, rule out acute myocardial infarction 11/18/2015  . Bipolar I disorder, most recent episode depressed (HCC)   . Altered mental status 08/24/2015  . Ileus (HCC)   . Bipolar I disorder (HCC) 07/25/2015  . Seizures (HCC) 07/25/2015  . Peritonitis (HCC) 07/16/2015  . Unstable angina (HCC) 07/09/2015  . ESRD on peritoneal dialysis (HCC) 07/09/2015  . Accelerated hypertension 07/09/2015  . Type 2 diabetes mellitus (HCC) 07/09/2015  . CAD (coronary artery disease) 07/09/2015  . HLD (hyperlipidemia) 07/09/2015  . GERD (gastroesophageal reflux disease) 07/09/2015    Past Surgical History:  Procedure Laterality Date  . ABDOMINAL HYSTERECTOMY     "partial"  . BELOW KNEE LEG AMPUTATION Right 2010?  [name] CARDIAC CATHETERIZATION Right 07/10/2015   Procedure: Left Heart Cath and Coronary Angiography;  Surgeon: Laurier Nancy, MD;  Location: [facility] INVASIVE CV LAB;  Service: Cardiovascular;  Laterality: Right;  . CARDIAC CATHETERIZATION Right 09/14/2015   Procedure: Left Heart Cath and Coronary Angiography;  Surgeon: Laurier Nancy, MD;  Location: [facility] INVASIVE CV LAB;  Service: Cardiovascular;  Laterality: Right;  . CARDIAC CATHETERIZATION N/A 09/14/2015   Procedure: Coronary Stent Intervention;  Surgeon: Alwyn Pea, MD;  Location: [facility] INVASIVE CV LAB;  Service: Cardiovascular;   Laterality: N/A;  . CARDIAC CATHETERIZATION Right 11/20/2015   Procedure: Left Heart Cath and Coronary Angiography;  Surgeon: Laurier Nancy, MD;  Location: [facility] INVASIVE CV LAB;  Service: Cardiovascular;  Laterality: Right;  . CORONARY ANGIOPLASTY WITH STENT PLACEMENT  <2017   @ [facility]/notes 07/03/2013  . HYSTEROTOMY    . PERIPHERAL VASCULAR CATHETERIZATION N/A 08/30/2015   Procedure: Dialysis/Perma Catheter Insertion;  Surgeon: Annice Needy, MD;  Location: [facility] INVASIVE CV LAB;  Service: Cardiovascular;  Laterality: N/A;  . PERIPHERAL VASCULAR CATHETERIZATION N/A 01/01/2016   Procedure: Dialysis/Perma Catheter Insertion;  Surgeon: Annice Needy, MD;  Location: [facility] INVASIVE CV LAB;  Service: Cardiovascular;  Laterality: N/A;  . PERITONEAL CATHETER INSERTION  11/03/2013   [name] 11/03/2013  . PERITONEAL CATHETER REMOVAL  01/01/2016   "took the one from May out; put new PD cath in" (01/19/2016)  . PERITONEAL CATHETER REMOVAL  11/03/2013; 02/11/2014   [name] 11/03/2013; Removal of tunneled catheter/notes 02/11/2014  . SALPINGOOPHORECTOMY Right    [name] 07/03/2013  . TUBAL LIGATION      Prior to Admission medications   Medication Sig Start Date End Date Taking? Authorizing Provider  Amino Acids-Protein Hydrolys (FEEDING SUPPLEMENT, PRO-STAT SUGAR FREE 64,) LIQD Take 30 mLs by mouth 3 (three) times daily with meals.    Historical Provider, MD  amLODipine (NORVASC) 10 MG tablet Take 10 mg by mouth daily.     Historical Provider, MD  aspirin EC 81 MG tablet Take 81 mg by mouth daily.    Historical Provider, MD  buPROPion (WELLBUTRIN XL) 150 MG 24 hr tablet Take 150 mg by mouth daily.     Historical Provider, MD  calcium carbonate (TUMS - DOSED IN MG ELEMENTAL CALCIUM) 500 MG chewable tablet Chew 1 tablet (200 mg of elemental calcium total) by mouth 3 (three) times daily with meals. 08/02/15   Adrian Saran, MD  clindamycin (CLEOCIN) 150 MG capsule Take 3 capsules (450 mg total) by mouth 3 (three) times daily.  04/02/16 04/12/16  Nita Sickle, MD  clopidogrel (PLAVIX) 75 MG tablet Take 75 mg by mouth daily.    Historical Provider, MD  doxycycline (VIBRA-TABS) 100 MG tablet Take 1 tablet (100 mg total) by mouth 2 (two) times daily. 03/29/16 04/08/16  Dinah C Ngetich, NP  fluticasone (FLONASE) 50 MCG/ACT nasal spray Place 1 spray into both nostrils daily.     Historical Provider, MD  gabapentin (NEURONTIN) 300 MG capsule Take 300 mg by mouth 3 (three) times daily.    Historical Provider, MD  hydrALAZINE (APRESOLINE) 25 MG tablet Take 25 mg by mouth daily.     Historical Provider, MD  insulin aspart (NOVOLOG) 100 UNIT/ML injection Inject 10 Units into the skin 3 (three) times daily with meals.     Historical Provider, MD  insulin glargine (LANTUS) 100 UNIT/ML injection Inject 0.08 mLs (8 Units total) into the skin at bedtime. 08/30/15   Milagros Loll, MD  isosorbide mononitrate (IMDUR) 60 MG 24 hr tablet Take 1.5 tablets (90 mg total) by mouth daily. 01/20/16   Vassie Loll, MD  lactulose (CHRONULAC) 10 GM/15ML solution Take 10  g by mouth daily.     Historical Provider, MD  levETIRAcetam (KEPPRA) 500 MG tablet Take 1 tablet (500 mg total) by mouth every morning. 08/02/15   Sital Mody, MD  Lidocaine-Menthol (ICY HOT LIDOCAINE PLUS MENTHOL) 4-1 % PTCH Apply 2 patches topically daily. Apply 2 patches to lumbar region QAM, remove after 12 hours    Historical Provider, MD  lisinopril (PRINIVIL,ZESTRIL) 10 MG tablet Take 1 tablet (10 mg total) by mouth daily. 01/21/16   Vassie Loll, MD  metoCLOPramide (REGLAN) 10 MG tablet Take 10 mg by mouth 4 (four) times daily.    Historical Provider, MD  metoprolol (LOPRESSOR) 50 MG tablet Take 1 tablet (50 mg total) by mouth 2 (two) times daily. 09/15/15   Milagros Loll, MD  multivitamin (RENA-VIT) TABS tablet Take 1 tablet by mouth daily.    Historical Provider, MD  mupirocin ointment (BACTROBAN) 2 % Place 1 application into the nose 2 (two) times daily. 11/21/15   Katharina Caper, MD  nitroGLYCERIN (NITROSTAT) 0.4 MG SL tablet Place 0.4 mg under the tongue every 5 (five) minutes as needed for chest pain. Reported on 07/10/2015    Historical Provider, MD  omeprazole (PRILOSEC) 20 MG capsule Take 20 mg by mouth 2 (two) times daily before a meal.     Historical Provider, MD  ondansetron (ZOFRAN ODT) 4 MG disintegrating tablet Take 1 tablet (4 mg total) by mouth every 6 (six) hours as needed for nausea or vomiting. 07/16/15   Sharyn Creamer, MD  oxyCODONE-acetaminophen (PERCOCET/ROXICET) 5-325 MG tablet Take one tablet by mouth every 6 hours as needed for moderate pain. Do not exceed 4gm of Tylenol in 24 hours 03/16/16   Ramonita Lab, MD  polyethylene glycol (MIRALAX / GLYCOLAX) packet Take 17 g by mouth daily. 08/02/15   Adrian Saran, MD  risperiDONE (RISPERDAL) 1 MG tablet Take 1 mg by mouth 3 (three) times daily.    Historical Provider, MD  saccharomyces boulardii (FLORASTOR) 250 MG capsule Take 1 capsule (250 mg total) by mouth 2 (two) times daily. 03/29/16 04/08/16  Dinah C Ngetich, NP  senna (SENOKOT) 8.6 MG TABS tablet Take 2 tablets by mouth 2 (two) times daily.     Historical Provider, MD  sevelamer carbonate (RENVELA) 800 MG tablet Take 800 mg by mouth 4 (four) times daily. Take 3 times with meals and 1 with snacks.    Historical Provider, MD  torsemide (DEMADEX) 100 MG tablet Take 100 mg by mouth daily.     Historical Provider, MD    Allergies Cephalosporins; Penicillins; Lamictal [lamotrigine]; Phenergan [promethazine hcl]; Pravastatin; and Sulfa antibiotics  Family History  Problem Relation Age of Onset  . CAD    . Diabetes    . Bipolar disorder    . Cervical cancer Mother     Social History Social History  Substance Use Topics  . Smoking status: Former Smoker    Packs/day: 0.75    Types: Cigarettes    Quit date: 07/01/2015  . Smokeless tobacco: Never Used  . Alcohol use 0.0 oz/week     Comment: 01/19/2016 "might have a couple drinks/year"    Review of  Systems  Constitutional: Negative for fever. + chills Eyes: Negative for visual changes. ENT: Negative for sore throat. Neck: No neck pain  Cardiovascular: Negative for chest pain. Respiratory: Negative for shortness of breath. Gastrointestinal: + lower abdominal pain and nausea. No vomiting or diarrhea.  Genitourinary: Negative for dysuria. Musculoskeletal: Negative for back pain. Skin: + redness of abdominal  wall Neurological: Negative for headaches, weakness or numbness. Psych: No SI or HI  ____________________________________________   PHYSICAL EXAM:  VITAL SIGNS: ED Triage Vitals  Enc Vitals Group     BP 04/02/16 1509 (!) 197/59     Pulse Rate 04/02/16 1509 77     Resp 04/02/16 1509 20     Temp 04/02/16 1509 97.9 F (36.6 C)     Temp Source 04/02/16 1509 Oral     SpO2 04/02/16 1509 100 %     Weight 04/02/16 1510 251 lb (113.9 kg)     Height 04/02/16 1510 5\' 9"  (1.753 m)     Head Circumference --      Peak Flow --      Pain Score --      Pain Loc --      Pain Edu? --      Excl. in GC? --     Constitutional: Alert and oriented. Well appearing and in no apparent distress. HEENT:      Head: Normocephalic and atraumatic.         Eyes: Conjunctivae are normal. Sclera is non-icteric. EOMI. PERRL      Mouth/Throat: Mucous membranes are moist.       Neck: Supple with no signs of meningismus. Cardiovascular: Regular rate and rhythm. No murmurs, gallops, or rubs. 2+ symmetrical distal pulses are present in all extremities. No JVD. Respiratory: Normal respiratory effort. Lungs are clear to auscultation bilaterally. No wheezes, crackles, or rhonchi.  Gastrointestinal: Obese, large area of erythema and warmth surrounding the site of old peritoneal catheter insertion, stitches in place, with significant induration, no purulent discharge. Patient has diffuse tenderness to palpation of the lower abdomen with no rebound or guarding  Musculoskeletal: Nontender with normal range of  motion in all extremities. No edema, cyanosis, or erythema of extremities. Neurologic: Normal speech and language. Face is symmetric. Moving all extremities. No gross focal neurologic deficits are appreciated. Skin: Skin is warm, dry and intact. No rash noted. Psychiatric: Mood and affect are normal. Speech and behavior are normal.  ____________________________________________   LABS (all labs ordered are listed, but only abnormal results are displayed)  Labs Reviewed  CBC - Abnormal; Notable for the following:       Result Value   RBC 3.44 (*)    Hemoglobin 9.5 (*)    HCT 28.5 (*)    RDW 15.4 (*)    Platelets 454 (*)    All other components within normal limits  BASIC METABOLIC PANEL - Abnormal; Notable for the following:    Potassium 3.3 (*)    Chloride 96 (*)    Glucose, Bld 160 (*)    BUN 21 (*)    Creatinine, Ser 3.21 (*)    Calcium 8.8 (*)    GFR calc non Af Amer 16 (*)    GFR calc Af Amer 18 (*)    All other components within normal limits  LACTIC ACID, PLASMA   ____________________________________________  EKG  none  ____________________________________________  RADIOLOGY  CT a/p:  No focal abscess at site of peritoneal dialysis catheter removal. Ill-defined subcutaneous hyperdensity likely represents blood products from prior hematoma. No active hemorrhage identified. Diffuse anasarca noted.  Less pronounced perirectal thickening surrounding stool. Mild stercoral colitis is not entirely excluded.  Bilateral small pleural effusions are now noted with adjacent atelectasis.  Aortic atherosclerosis with coronary arteriosclerosis. ____________________________________________   PROCEDURES  Procedure(s) performed: None Procedures Critical Care performed:  None ____________________________________________   INITIAL IMPRESSION /  ASSESSMENT AND PLAN / ED COURSE  51 year old female who presents for evaluation of worsening erythema surrounding the old  insertion site of the peritoneal dialysis catheter that was removed 2 weeks ago. She is currently on day 3 of doxycycline with worsening erythema and warmth in now complaining of diffuse lower abdominal pain. Patient has no fever or tachycardia. On exam she has large area of erythema and warmth surrounding the site of old peritoneal catheter insertion, stitches in place, with significant induration, no purulent discharge. Patient has diffuse tenderness to palpation of the lower abdomen with no rebound or guarding concerning for possible deep abscess. Plan for labs, lactate, CT a/p. Will give morphine for pain.     Clinical Course as of Apr 02 1828  Tue Apr 02, 2016  1824 CT with no evidence of abscess showing possible hematoma. Lactic acid is normal, white count is normal, patient remains afebrile in the emergency room. We'll switch her to clindamycin, demarcate the erythematous area and discharge her home with close return precautions for any signs of expanding erythema or fever. Patient is to have dialysis again tomorrow which is already scheduled to remove the contrast load. Patient has no shortness of breath at this time and no other indication for emergent dialysis.  [CV]    Clinical Course User Index [CV] Nita Sickle, MD    Pertinent labs & imaging results that were available during my care of the patient were reviewed by me and considered in my medical decision making (see chart for details).    ____________________________________________   FINAL CLINICAL IMPRESSION(S) / ED DIAGNOSES  Final diagnoses:  Cellulitis of abdominal wall      NEW MEDICATIONS STARTED DURING THIS VISIT:  New Prescriptions   CLINDAMYCIN (CLEOCIN) 150 MG CAPSULE    Take 3 capsules (450 mg total) by mouth 3 (three) times daily.     Note:  This document was prepared using Dragon voice recognition software and may include unintentional dictation errors.    Nita Sickle, MD 04/02/16 [phone]

## 2016-04-02 NOTE — ED Triage Notes (Signed)
Pt in via EMS from [facility]; per EMS, pt to be seen by Dr. Lorretta Harp for a direct admit into hospital.  Pt with complaints of pain to recent operative site at RLQ; area noted to be red, warm to touch.  Pt states old PD catheter was removed approximately 2 weeks ago; reports pain and redness x 4-5 days.  Pt A/Ox4, no immediate distress at this time.

## 2016-04-02 NOTE — ED Notes (Signed)
Patient transported to CT 

## 2016-04-03 LAB — CBC AND DIFFERENTIAL
HCT: 28 % — AB (ref 36–46)
Hemoglobin: 8.5 g/dL — AB (ref 12.0–16.0)
Platelets: 387 10*3/uL (ref 150–399)
WBC: 6.8 10*3/mL

## 2016-04-04 ENCOUNTER — Ambulatory Visit (INDEPENDENT_AMBULATORY_CARE_PROVIDER_SITE_OTHER): Payer: Medicare HMO | Admitting: Vascular Surgery

## 2016-04-04 ENCOUNTER — Encounter (INDEPENDENT_AMBULATORY_CARE_PROVIDER_SITE_OTHER): Payer: Medicare HMO

## 2016-04-04 ENCOUNTER — Non-Acute Institutional Stay (SKILLED_NURSING_FACILITY): Payer: Medicare HMO | Admitting: Family

## 2016-04-04 DIAGNOSIS — L03311 Cellulitis of abdominal wall: Secondary | ICD-10-CM

## 2016-04-04 NOTE — Progress Notes (Signed)
Location:  [facility] Room Number: 401 P  Place of Service:  SNF [phone]) Provider:  Yoshiko Keleher FNP-C   DALE, Jordan Hawks, MD  Patient Care Team: Jarome Matin, MD as PCP - General Laurier Nancy, MD as Consulting Physician (Cardiology) Mady Haagensen, MD as Consulting Physician (Internal Medicine)  Extended Emergency Contact Information Primary Emergency Contact: Dixon,Heather N Address: [address]          [place], [place] [address] [place] Mobile Phone: [phone] Relation: Daughter  Code Status:  Full Code  Goals of care: Advanced Directive information Advanced Directives 04/02/2016  Does Patient Have a Medical Advance Directive? No  Would patient like information on creating a medical advance directive? No - Patient declined     Chief Complaint  Patient presents with  . Acute Visit    abnormal labs     HPI:  Pt is a 51 y.o. female seen today at [facility] for an acute visit for evaluation of swelling on right abdomen. She has a significant medical history of ESRD on dialysis, HTN, CAD, CHF, GERD, Type 2 DM among others. She is seen in her room today. She complains of swelling on the previous peritoneal dialysis catheter site. Her peritoneal dialysis catheter was recently removed by [facility] in [place]. She denies any fever or chills.     Past Medical History:  Diagnosis Date  . Anginal pain (HCC)   . Anxiety   . Bipolar disorder (HCC)   . CAD (coronary artery disease)   . CHF (congestive heart failure) (HCC)   . Chronic lower back pain   . Depression   . Endometriosis   . ESRD (end stage renal disease) on dialysis (HCC)    "[facility]; [address]; [place]; TTS" (01/19/2016)  . Gastroparesis   . GERD (gastroesophageal reflux disease)   . History of blood transfusion "several"   "my blood would get low; low RBC"  . History of hiatal hernia   . HLD (hyperlipidemia)   .  Hypertension   . Migraine    "monthly" (01/19/2016)  . Myocardial infarction 2017   "~ 3 wks ago" (01/19/2016)  . Renal disorder   . Renal insufficiency   . Seizures (HCC) 07/2015   "I've only had the 1; don't know what from" (01/19/2016)  . Type II diabetes mellitus (HCC)    Past Surgical History:  Procedure Laterality Date  . ABDOMINAL HYSTERECTOMY     "partial"  . BELOW KNEE LEG AMPUTATION Right 2010?  [name] CARDIAC CATHETERIZATION Right 07/10/2015   Procedure: Left Heart Cath and Coronary Angiography;  Surgeon: Laurier Nancy, MD;  Location: [facility];  Service: Cardiovascular;  Laterality: Right;  . CARDIAC CATHETERIZATION Right 09/14/2015   Procedure: Left Heart Cath and Coronary Angiography;  Surgeon: Laurier Nancy, MD;  Location: [facility];  Service: Cardiovascular;  Laterality: Right;  . CARDIAC CATHETERIZATION N/A 09/14/2015   Procedure: Coronary Stent Intervention;  Surgeon: Alwyn Pea, MD;  Location: [facility];  Service: Cardiovascular;  Laterality: N/A;  . CARDIAC CATHETERIZATION Right 11/20/2015   Procedure: Left Heart Cath and Coronary Angiography;  Surgeon: Laurier Nancy, MD;  Location: [facility];  Service: Cardiovascular;  Laterality: Right;  . CORONARY ANGIOPLASTY WITH STENT PLACEMENT  <2017   @ [facility]/notes 07/03/2013  . HYSTEROTOMY    . PERIPHERAL VASCULAR CATHETERIZATION N/A 08/30/2015  Procedure: Dialysis/Perma Catheter Insertion;  Surgeon: Annice Needy, MD;  Location: [facility];  Service: Cardiovascular;  Laterality: N/A;  . PERIPHERAL VASCULAR CATHETERIZATION N/A 01/01/2016   Procedure: Dialysis/Perma Catheter Insertion;  Surgeon: Annice Needy, MD;  Location: [facility];  Service: Cardiovascular;  Laterality: N/A;  . PERITONEAL CATHETER INSERTION  11/03/2013   [name] 11/03/2013  . PERITONEAL CATHETER REMOVAL  01/01/2016   "took the one from May out; put new PD cath in" (01/19/2016)  . PERITONEAL  CATHETER REMOVAL  11/03/2013; 02/11/2014   [name] 11/03/2013; Removal of tunneled catheter/notes 02/11/2014  . SALPINGOOPHORECTOMY Right    [name] 07/03/2013  . TUBAL LIGATION      Allergies  Allergen Reactions  . Cephalosporins Anaphylaxis    Patient has tolerated meropenem.   [name] Penicillins Anaphylaxis and Other (See Comments)    Has patient had a PCN reaction causing immediate rash, facial/tongue/throat swelling, SOB or lightheadedness with hypotension: Yes Has patient had a PCN reaction causing severe rash involving mucus membranes or skin necrosis: No Has patient had a PCN reaction that required hospitalization No Has patient had a PCN reaction occurring within the last 10 years: No If all of the above answers are "NO", then may proceed with Cephalosporin use.  . Lamictal [Lamotrigine] Other (See Comments)    Reaction:  Hallucinations  . Phenergan [Promethazine Hcl] Nausea And Vomiting  . Pravastatin Other (See Comments)    Reaction:  Muscle pain   . Sulfa Antibiotics Other (See Comments)    Reaction:  Unknown     Allergies as of 04/04/2016      Reactions   Cephalosporins Anaphylaxis   Patient has tolerated meropenem.    Penicillins Anaphylaxis, Other (See Comments)   Has patient had a PCN reaction causing immediate rash, facial/tongue/throat swelling, SOB or lightheadedness with hypotension: Yes Has patient had a PCN reaction causing severe rash involving mucus membranes or skin necrosis: No Has patient had a PCN reaction that required hospitalization No Has patient had a PCN reaction occurring within the last 10 years: No If all of the above answers are "NO", then may proceed with Cephalosporin use.   Lamictal [lamotrigine] Other (See Comments)   Reaction:  Hallucinations   Phenergan [promethazine Hcl] Nausea And Vomiting   Pravastatin Other (See Comments)   Reaction:  Muscle pain    Sulfa Antibiotics Other (See Comments)   Reaction:  Unknown       Medication List         Accurate as of 04/04/16  3:45 PM. Always use your most recent med list.          amLODipine 10 MG tablet Commonly known as:  NORVASC Take 10 mg by mouth daily.   aspirin EC 81 MG tablet Take 81 mg by mouth daily.   buPROPion 150 MG 24 hr tablet Commonly known as:  WELLBUTRIN XL Take 150 mg by mouth daily.   calcium carbonate 500 MG chewable tablet Commonly known as:  TUMS - dosed in mg elemental calcium Chew 1 tablet (200 mg of elemental calcium total) by mouth 3 (three) times daily with meals.   clindamycin 150 MG capsule Commonly known as:  CLEOCIN Take 3 capsules (450 mg total) by mouth 3 (three) times daily.   clopidogrel 75 MG tablet Commonly known as:  PLAVIX Take 75 mg by mouth daily.   doxycycline 100 MG tablet Commonly known as:  VIBRA-TABS Take 1 tablet (100 mg total) by mouth 2 (two) times  daily.   feeding supplement (PRO-STAT SUGAR FREE 64) Liqd Take 30 mLs by mouth 3 (three) times daily with meals.   fluticasone 50 MCG/ACT nasal spray Commonly known as:  FLONASE Place 1 spray into both nostrils daily.   gabapentin 300 MG capsule Commonly known as:  NEURONTIN Take 300 mg by mouth 3 (three) times daily.   hydrALAZINE 25 MG tablet Commonly known as:  APRESOLINE Take 25 mg by mouth daily.   ICY HOT LIDOCAINE PLUS MENTHOL 4-1 % Ptch Generic drug:  Lidocaine-Menthol Apply 2 patches topically daily. Apply 2 patches to lumbar region QAM, remove after 12 hours   insulin aspart 100 UNIT/ML injection Commonly known as:  novoLOG Inject 10 Units into the skin 3 (three) times daily with meals.   insulin glargine 100 UNIT/ML injection Commonly known as:  LANTUS Inject 0.08 mLs (8 Units total) into the skin at bedtime.   isosorbide mononitrate 60 MG 24 hr tablet Commonly known as:  IMDUR Take 1.5 tablets (90 mg total) by mouth daily.   lactulose 10 GM/15ML solution Commonly known as:  CHRONULAC Take 10 g by mouth daily.   levETIRAcetam 500 MG  tablet Commonly known as:  KEPPRA Take 1 tablet (500 mg total) by mouth every morning.   lisinopril 10 MG tablet Commonly known as:  PRINIVIL,ZESTRIL Take 1 tablet (10 mg total) by mouth daily.   metoCLOPramide 10 MG tablet Commonly known as:  REGLAN Take 10 mg by mouth 4 (four) times daily.   metoprolol 50 MG tablet Commonly known as:  LOPRESSOR Take 1 tablet (50 mg total) by mouth 2 (two) times daily.   multivitamin Tabs tablet Take 1 tablet by mouth daily.   mupirocin ointment 2 % Commonly known as:  BACTROBAN Place 1 application into the nose 2 (two) times daily.   nitroGLYCERIN 0.4 MG SL tablet Commonly known as:  NITROSTAT Place 0.4 mg under the tongue every 5 (five) minutes as needed for chest pain. Reported on 07/10/2015   omeprazole 20 MG capsule Commonly known as:  PRILOSEC Take 20 mg by mouth 2 (two) times daily before a meal.   ondansetron 4 MG disintegrating tablet Commonly known as:  ZOFRAN ODT Take 1 tablet (4 mg total) by mouth every 6 (six) hours as needed for nausea or vomiting.   oxyCODONE-acetaminophen 5-325 MG tablet Commonly known as:  PERCOCET/ROXICET Take one tablet by mouth every 6 hours as needed for moderate pain. Do not exceed 4gm of Tylenol in 24 hours   polyethylene glycol packet Commonly known as:  MIRALAX / GLYCOLAX Take 17 g by mouth daily.   risperiDONE 1 MG tablet Commonly known as:  RISPERDAL Take 1 mg by mouth 3 (three) times daily.   saccharomyces boulardii 250 MG capsule Commonly known as:  FLORASTOR Take 1 capsule (250 mg total) by mouth 2 (two) times daily.   senna 8.6 MG Tabs tablet Commonly known as:  SENOKOT Take 2 tablets by mouth 2 (two) times daily.   sevelamer carbonate 800 MG tablet Commonly known as:  RENVELA Take 800 mg by mouth 4 (four) times daily. Take 3 times with meals and 1 with snacks.   torsemide 100 MG tablet Commonly known as:  DEMADEX Take 100 mg by mouth daily.       Review of Systems   Constitutional: Negative for activity change, appetite change, chills, fatigue and fever.  HENT: Negative for congestion, rhinorrhea, sinus pressure, sneezing and sore throat.   Eyes: Negative.   Respiratory: Negative  for cough, shortness of breath and wheezing.   Cardiovascular: Negative for chest pain, palpitations and leg swelling.  Gastrointestinal: Negative for abdominal distention, abdominal pain, constipation, diarrhea, nausea and vomiting.  Genitourinary:       ESRD on dialysis   Musculoskeletal: Positive for gait problem.  Skin: Negative for color change, pallor and rash.       Right lower quadrant previous peritoneal dialysis site swelling and redness has improved  Neurological: Negative for dizziness, seizures, syncope, light-headedness and headaches.  Psychiatric/Behavioral: Negative for agitation, confusion, hallucinations and sleep disturbance. The patient is not nervous/anxious.     Immunization History  Administered Date(s) Administered  . Influenza, Quadrivalent, Recombinant, Inj, Pf 01/25/2013  . Influenza, Seasonal, Injecte, Preservative Fre 01/02/2012  . Influenza-Unspecified 02/27/2016  . PPD Test 02/27/2016  . Pneumococcal Polysaccharide-23 07/23/2011  . Td 06/08/2007   Pertinent  Health Maintenance Due  Topic Date Due  . FOOT EXAM  10/03/1973  . OPHTHALMOLOGY EXAM  10/03/1973  . PAP SMEAR  10/03/1984  . MAMMOGRAM  10/03/2013  . COLONOSCOPY  10/03/2013  . HEMOGLOBIN A1C  03/14/2016  . INFLUENZA VACCINE  Completed      Vitals:   04/04/16 1500  BP: (!) 144/72  Pulse: 74  Resp: 20  Temp: 98.2 F (36.8 C)  SpO2: 98%  Weight: 249 lb 4.8 oz (113.1 kg)  Height: 5\' 9"  (1.753 m)   Body mass index is 36.82 kg/m. Physical Exam  Constitutional: She is oriented to person, place, and time. She appears well-developed. No distress.  Obese   HENT:  Head: Normocephalic.  Mouth/Throat: Oropharynx is clear and moist. No oropharyngeal exudate.  Eyes:  Conjunctivae and EOM are normal. Pupils are equal, round, and reactive to light. No scleral icterus.  Neck: Normal range of motion. No JVD present. No thyromegaly present.  Cardiovascular: Normal rate, regular rhythm, normal heart sounds and intact distal pulses.  Exam reveals no gallop and no friction rub.   No murmur heard. Pulmonary/Chest: Effort normal and breath sounds normal. No respiratory distress. She has no wheezes. She has no rales.  Abdominal: Soft. Bowel sounds are normal. She exhibits no distension. There is no tenderness. There is no rebound and no guarding.  Right lower abdominal previous peritoneal site swelling and redness has improved. Site hard and nontender to touch. Sutures dry clean and intact.   Musculoskeletal: She exhibits no tenderness.  Right BKA. Left leg Trace edema.   Lymphadenopathy:    She has no cervical adenopathy.  Neurological: She is oriented to person, place, and time.  Skin: Skin is warm and dry. No rash noted. No erythema. No pallor.  Right lower quadrant previous peritoneal dialysis site swelling and redness has improved. Non-tender to touch. Sutures dry clean and intact.   Psychiatric: She has a normal mood and affect.    Labs reviewed:  Recent Labs  05/14/15 0547  07/21/15 0427  11/19/15 1547  01/04/16 1055  01/20/16 0820  02/27/16 1025  03/14/16 0742 03/16/16 0541 04/02/16 1513  NA 140  < > 128*  < >  --   < > 130*  < > 137  < >  --   < > 134* 131* 136  K 4.7  < > 4.2  < >  --   < > 3.9  < > 3.6  < >  --   < > 3.4* 3.8 3.3*  CL 109  < > 91*  < >  --   < > 91*  < >  99*  < >  --   < > 99* 99* 96*  CO2 22  < > 22  < >  --   < > 29  < > 31  < >  --   < > 26 28 30   GLUCOSE 181*  < > 157*  < >  --   < > 147*  < > 107*  < >  --   < > 91 96 160*  BUN 64*  < > 62*  < >  --   < > 51*  < > 28*  < >  --   < > 37* 31* 21*  CREATININE 7.10*  < > 9.89*  < >  --   < > 5.58*  < > 3.91*  < >  --   < > 4.65* 4.06* 3.21*  CALCIUM 7.8*  < > 5.9*  < >  --    < > 10.5*  < > 9.2  < >  --   < > 8.4* 8.9 8.8*  MG 2.4  --  1.9  --  1.9  --   --   --   --   --   --   --   --   --   --   PHOS 8.0*  8.0*  < >  --   < >  --   < > 3.5  --  4.7*  --  4.9*  --   --   --   --   < > = values in this interval not displayed.  Recent Labs  12/25/15 1434  01/20/16 0820 01/26/16 02/25/16 1328 03/13/16 1612  AST 17  < >  --  8* 28 12*  ALT 9*  --   --  3* 9* 8*  ALKPHOS 66  < >  --  88 70 73  BILITOT 0.5  --   --   --  0.7 0.5  PROT 5.0*  --   --   --  5.6* 5.7*  ALBUMIN 2.2*  < > 1.9*  --  2.5* 2.4*  < > = values in this interval not displayed.  Recent Labs  01/19/16 0122  03/03/16 2155  03/13/16 1612 03/14/16 0742 03/16/16 0541 04/02/16 1513  WBC 7.6  < > 9.6  < > 6.2 6.5 5.4 7.1  NEUTROABS 5.3  --  6.9  --  4.4  --   --   --   HGB 7.8*  < > 12.1  < > 11.4* 11.7* 9.4* 9.5*  HCT 25.8*  < > 38.6  < > 35.5 36.1 28.4* 28.5*  MCV 90.5  < > 85.4  --  84.8 85.0 84.9 83.1  PLT 448*  < > 364  < > 308 274 248 454*  < > = values in this interval not displayed. Lab Results  Component Value Date   TSH 8.570 (H) 11/19/2015   Lab Results  Component Value Date   HGBA1C 6.2 (H) 09/13/2015   Lab Results  Component Value Date   CHOL 215 (H) 11/19/2015   HDL 63 11/19/2015   LDLCALC 115 (H) 11/19/2015   TRIG 186 (H) 11/19/2015   CHOLHDL 3.4 11/19/2015   Assessment/Plan  Cellulitis of abdominal wall previous peritoneal dialysis catheter site recently removed by [facility] in [place].She was recently evaluated at the ER 04/03/2016. Clindamycin started and Doxycycline discontinued. Afebrile. Right lower Abdomen quadrant swelling and redness has improved. No drainage and nontender to  touch. Sutures dry, clean and intact. Wound cultures result showed positive for MRSA resistant to clindamycin.Will restart Doxycycline 100 mg Tablet twice daily x 10 days. Florastor 250 mg Capsule twice daily by mouth X 10 days for prophylaxis. Will monitor Temp  curve and CBC. Place on contact isolation for MRSA until antibiotics course completed. Facility Wound care Nurse to remove sutures.    Family/ staff Communication: Reviewed plan of care with patient and facility Nurse supervisor.   Labs/tests ordered: None

## 2016-04-08 ENCOUNTER — Ambulatory Visit (INDEPENDENT_AMBULATORY_CARE_PROVIDER_SITE_OTHER): Payer: Medicare HMO | Admitting: Vascular Surgery

## 2016-04-08 ENCOUNTER — Encounter (INDEPENDENT_AMBULATORY_CARE_PROVIDER_SITE_OTHER): Payer: Self-pay

## 2016-04-08 ENCOUNTER — Encounter (INDEPENDENT_AMBULATORY_CARE_PROVIDER_SITE_OTHER): Payer: Self-pay | Admitting: Vascular Surgery

## 2016-04-08 VITALS — BP 167/67 | HR 57 | Resp 16 | Ht 69.0 in | Wt 251.0 lb

## 2016-04-08 DIAGNOSIS — E1122 Type 2 diabetes mellitus with diabetic chronic kidney disease: Secondary | ICD-10-CM | POA: Diagnosis not present

## 2016-04-08 DIAGNOSIS — T829XXS Unspecified complication of cardiac and vascular prosthetic device, implant and graft, sequela: Secondary | ICD-10-CM

## 2016-04-08 DIAGNOSIS — E782 Mixed hyperlipidemia: Secondary | ICD-10-CM | POA: Diagnosis not present

## 2016-04-08 DIAGNOSIS — I10 Essential (primary) hypertension: Secondary | ICD-10-CM

## 2016-04-08 DIAGNOSIS — N186 End stage renal disease: Secondary | ICD-10-CM | POA: Diagnosis not present

## 2016-04-08 DIAGNOSIS — Z992 Dependence on renal dialysis: Secondary | ICD-10-CM

## 2016-04-08 DIAGNOSIS — Z794 Long term (current) use of insulin: Secondary | ICD-10-CM | POA: Diagnosis not present

## 2016-04-08 DIAGNOSIS — I25119 Atherosclerotic heart disease of native coronary artery with unspecified angina pectoris: Secondary | ICD-10-CM | POA: Diagnosis not present

## 2016-04-08 DIAGNOSIS — T829XXA Unspecified complication of cardiac and vascular prosthetic device, implant and graft, initial encounter: Secondary | ICD-10-CM | POA: Insufficient documentation

## 2016-04-08 NOTE — Progress Notes (Signed)
MRN : [id number]  Kimberly Montgomery is a 51 y.o. ([birth year]) female who presents with chief complaint of  Chief Complaint  Patient presents with  . Follow-up  .  History of Present Illness:  The patient is seen for evaluation of dialysis access.  The patient has a history of multiple failed accesses.  There have beenMultiple accesses in both arms.  The patient was started on dialysis 3 years ago. Initially she did peritoneal dialysis for approximately 2 years but did not do well with this and was converted to hemodialysis. The patient is right-handed.  Current access is via a right IJ catheter which is functioning poorly. This catheter is been present for 1 year.  There have been several episodes of catheter infection.    The patient denies amaurosis fugax or recent TIA symptoms. There are no recent neurological changes noted. The patient denies claudication symptoms or rest pain symptoms. The patient denies history of DVT, PE or superficial thrombophlebitis. The patient denies recent episodes of angina or shortness of breath.   Vein mapping done by [facility] dated 02/09/2016 demonstrates a cephalic vein of the right arm which is 3.8 mm at the antecubital fossa and increases in size through the shoulder. In the forearm it is inadequate. On the left side the cephalic vein is inadequate. Bilateral basilic veins are noted which are marginal.   Current Meds  Medication Sig  . Amino Acids-Protein Hydrolys (FEEDING SUPPLEMENT, PRO-STAT SUGAR FREE 64,) LIQD Take 30 mLs by mouth 3 (three) times daily with meals.  [name] amLODipine (NORVASC) 10 MG tablet Take 10 mg by mouth daily.   [name] aspirin EC 81 MG tablet Take 81 mg by mouth daily.  [name] buPROPion (WELLBUTRIN XL) 150 MG 24 hr tablet Take 150 mg by mouth daily.   . calcium carbonate (TUMS - DOSED IN MG ELEMENTAL CALCIUM) 500 MG chewable tablet Chew 1 tablet (200 mg of elemental calcium total) by mouth 3 (three) times daily with meals.  .  clindamycin (CLEOCIN) 150 MG capsule Take 3 capsules (450 mg total) by mouth 3 (three) times daily.  . clopidogrel (PLAVIX) 75 MG tablet Take 75 mg by mouth daily.  [name] doxycycline (VIBRA-TABS) 100 MG tablet Take 1 tablet (100 mg total) by mouth 2 (two) times daily.  . fluticasone (FLONASE) 50 MCG/ACT nasal spray Place 1 spray into both nostrils daily.   [name] gabapentin (NEURONTIN) 300 MG capsule Take 300 mg by mouth 3 (three) times daily.  . hydrALAZINE (APRESOLINE) 25 MG tablet Take 25 mg by mouth daily.   . insulin aspart (NOVOLOG) 100 UNIT/ML injection Inject 10 Units into the skin 3 (three) times daily with meals.   . insulin glargine (LANTUS) 100 UNIT/ML injection Inject 0.08 mLs (8 Units total) into the skin at bedtime.  . isosorbide mononitrate (IMDUR) 60 MG 24 hr tablet Take 1.5 tablets (90 mg total) by mouth daily.  [name] lactulose (CHRONULAC) 10 GM/15ML solution Take 10 g by mouth daily.   [name] levETIRAcetam (KEPPRA) 500 MG tablet Take 1 tablet (500 mg total) by mouth every morning.  . Lidocaine-Menthol (ICY HOT LIDOCAINE PLUS MENTHOL) 4-1 % PTCH Apply 2 patches topically daily. Apply 2 patches to lumbar region QAM, remove after 12 hours  . lisinopril (PRINIVIL,ZESTRIL) 10 MG tablet Take 1 tablet (10 mg total) by mouth daily.  . metoCLOPramide (REGLAN) 10 MG tablet Take 10 mg by mouth 4 (four) times daily.  . metoprolol (LOPRESSOR) 50 MG tablet Take 1 tablet (  50 mg total) by mouth 2 (two) times daily.  . multivitamin (RENA-VIT) TABS tablet Take 1 tablet by mouth daily.  . mupirocin cream (BACTROBAN) 2 % Apply 1 application topically 2 (two) times daily.  . mupirocin ointment (BACTROBAN) 2 % Place 1 application into the nose 2 (two) times daily.  . nitroGLYCERIN (NITROSTAT) 0.4 MG SL tablet Place 0.4 mg under the tongue every 5 (five) minutes as needed for chest pain. Reported on 07/10/2015  . omeprazole (PRILOSEC) 20 MG capsule Take 20 mg by mouth 2 (two) times daily before a meal.   . ondansetron  (ZOFRAN ODT) 4 MG disintegrating tablet Take 1 tablet (4 mg total) by mouth every 6 (six) hours as needed for nausea or vomiting.  [name] oxyCODONE-acetaminophen (PERCOCET/ROXICET) 5-325 MG tablet Take one tablet by mouth every 6 hours as needed for moderate pain. Do not exceed 4gm of Tylenol in 24 hours  . polyethylene glycol (MIRALAX / GLYCOLAX) packet Take 17 g by mouth daily.  . risperiDONE (RISPERDAL) 1 MG tablet Take 1 mg by mouth 3 (three) times daily.  [name] saccharomyces boulardii (FLORASTOR) 250 MG capsule Take 1 capsule (250 mg total) by mouth 2 (two) times daily.  [name] senna (SENOKOT) 8.6 MG TABS tablet Take 2 tablets by mouth 2 (two) times daily.   . sevelamer carbonate (RENVELA) 800 MG tablet Take 800 mg by mouth 4 (four) times daily. Take 3 times with meals and 1 with snacks.  . torsemide (DEMADEX) 100 MG tablet Take 100 mg by mouth daily.     Past Medical History:  Diagnosis Date  . Anginal pain (HCC)   . Anxiety   . Bipolar disorder (HCC)   . CAD (coronary artery disease)   . CHF (congestive heart failure) (HCC)   . Chronic lower back pain   . Depression   . Endometriosis   . ESRD (end stage renal disease) on dialysis (HCC)    "[facility]; [address]; [place]; TTS" (01/19/2016)  . Gastroparesis   . GERD (gastroesophageal reflux disease)   . History of blood transfusion "several"   "my blood would get low; low RBC"  . History of hiatal hernia   . HLD (hyperlipidemia)   . Hypertension   . Migraine    "monthly" (01/19/2016)  . Myocardial infarction 2017   "~ 3 wks ago" (01/19/2016)  . Renal disorder   . Renal insufficiency   . Seizures (HCC) 07/2015   "I've only had the 1; don't know what from" (01/19/2016)  . Type II diabetes mellitus (HCC)     Past Surgical History:  Procedure Laterality Date  . ABDOMINAL HYSTERECTOMY     "partial"  . BELOW KNEE LEG AMPUTATION Right 2010?  [name] CARDIAC CATHETERIZATION Right 07/10/2015   Procedure: Left Heart Cath and Coronary Angiography;   Surgeon: Laurier Nancy, MD;  Location: [facility];  Service: Cardiovascular;  Laterality: Right;  . CARDIAC CATHETERIZATION Right 09/14/2015   Procedure: Left Heart Cath and Coronary Angiography;  Surgeon: Laurier Nancy, MD;  Location: [facility];  Service: Cardiovascular;  Laterality: Right;  . CARDIAC CATHETERIZATION N/A 09/14/2015   Procedure: Coronary Stent Intervention;  Surgeon: Alwyn Pea, MD;  Location: [facility];  Service: Cardiovascular;  Laterality: N/A;  . CARDIAC CATHETERIZATION Right 11/20/2015   Procedure: Left Heart Cath and Coronary Angiography;  Surgeon: Laurier Nancy, MD;  Location: [facility];  Service: Cardiovascular;  Laterality: Right;  . CORONARY ANGIOPLASTY WITH STENT PLACEMENT  <  2017   @ [facility]/notes 07/03/2013  . HYSTEROTOMY    . PERIPHERAL VASCULAR CATHETERIZATION N/A 08/30/2015   Procedure: Dialysis/Perma Catheter Insertion;  Surgeon: Annice Needy, MD;  Location: [facility];  Service: Cardiovascular;  Laterality: N/A;  . PERIPHERAL VASCULAR CATHETERIZATION N/A 01/01/2016   Procedure: Dialysis/Perma Catheter Insertion;  Surgeon: Annice Needy, MD;  Location: [facility];  Service: Cardiovascular;  Laterality: N/A;  . PERITONEAL CATHETER INSERTION  11/03/2013   [name] 11/03/2013  . PERITONEAL CATHETER REMOVAL  01/01/2016   "took the one from May out; put new PD cath in" (01/19/2016)  . PERITONEAL CATHETER REMOVAL  11/03/2013; 02/11/2014   [name] 11/03/2013; Removal of tunneled catheter/notes 02/11/2014  . SALPINGOOPHORECTOMY Right    [name] 07/03/2013  . TUBAL LIGATION      Social History Social History  Substance Use Topics  . Smoking status: Former Smoker    Packs/day: 0.75    Types: Cigarettes    Quit date: 07/01/2015  . Smokeless tobacco: Never Used  . Alcohol use 0.0 oz/week     Comment: 01/19/2016 "might have a couple drinks/year"    Family History Family History  Problem Relation Age of Onset    . CAD    . Diabetes    . Bipolar disorder    . Cervical cancer Mother   No family history of bleeding clotting disorders, porphyria or autoimmune disease.  Allergies  Allergen Reactions  . Cephalosporins Anaphylaxis    Patient has tolerated meropenem.   [name] Penicillins Anaphylaxis and Other (See Comments)    Has patient had a PCN reaction causing immediate rash, facial/tongue/throat swelling, SOB or lightheadedness with hypotension: Yes Has patient had a PCN reaction causing severe rash involving mucus membranes or skin necrosis: No Has patient had a PCN reaction that required hospitalization No Has patient had a PCN reaction occurring within the last 10 years: No If all of the above answers are "NO", then may proceed with Cephalosporin use.  . Lamictal [Lamotrigine] Other (See Comments)    Reaction:  Hallucinations  . Phenergan [Promethazine Hcl] Nausea And Vomiting  . Pravastatin Other (See Comments)    Reaction:  Muscle pain   . Sulfa Antibiotics Other (See Comments)    Reaction:  Unknown      REVIEW OF SYSTEMS (Negative unless checked)  Constitutional: [] Weight loss  [] Fever  [] Chills Cardiac: [] Chest pain   [] Chest pressure   [] Palpitations   [] Shortness of breath when laying flat   [x] Shortness of breath with exertion. Vascular:  [] Pain in legs with walking   [] Pain in legs at rest  [] History of DVT   [] Phlebitis   [x] Swelling in legs   [] Varicose veins   [] Non-healing ulcers Pulmonary:   [] Uses home oxygen   [] Productive cough   [] Hemoptysis   [] Wheeze  [] COPD   [] Asthma Neurologic:  [] Dizziness   [] Seizures   [] History of stroke   [] History of TIA  [] Aphasia   [] Vissual changes   [] Weakness or numbness in arm   [] Weakness or numbness in leg Musculoskeletal:   [] Joint swelling   [] Joint pain   [] Low back pain Hematologic:  [] Easy bruising  [] Easy bleeding   [] Hypercoagulable state   [] Anemic Gastrointestinal:  [] Diarrhea   [] Vomiting  [] Gastroesophageal reflux/heartburn    [] Difficulty swallowing. Genitourinary:  [x] Chronic kidney disease   [] Difficult urination  [] Frequent urination   [] Blood in urine Skin:  [] Rashes   [] Ulcers  Psychological:  [] History of anxiety   []  History of major  depression.  Physical Examination  Vitals:   04/08/16 0928  BP: (!) 167/67  Pulse: (!) 57  Resp: 16  Weight: 251 lb (113.9 kg)  Height: 5\' 9"  (1.753 m)   Body mass index is 37.07 kg/m. Gen: WD/WN, NAD Head: [place]/AT, No temporalis wasting.  Ear/Nose/Throat: Hearing grossly intact, nares w/o erythema or drainage, poor dentition Eyes: PER, EOMI, sclera nonicteric.  Neck: Supple, no masses.  No bruit or JVD.  Pulmonary:  Good air movement, clear to auscultation bilaterally, no use of accessory muscles.  Cardiac: RRR, normal S1, S2, no Murmurs. 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 None of them have a thrill or a bruit. Vessel Right Left  Radial 1+ Palpable 1+ Palpable  Ulnar Not Palpable Not Palpable  Brachial Palpable Palpable  Carotid Palpable Palpable  Gastrointestinal: soft, non-distended. No guarding/no peritoneal signs.  Musculoskeletal: M/S 5/5 throughout.  No deformity or atrophy.  Neurologic: CN 2-12 intact. Pain and light touch intact in extremities.  Symmetrical.  Speech is fluent. Motor exam as listed above. Psychiatric: Judgment intact, Mood & affect appropriate for pt's clinical situation. Dermatologic: No rashes or ulcers noted.  No changes consistent with cellulitis. Lymph : No Cervical lymphadenopathy, no lichenification or skin changes of chronic lymphedema.  CBC Lab Results  Component Value Date   WBC 7.1 04/02/2016   HGB 9.5 (L) 04/02/2016   HCT 28.5 (L) 04/02/2016   MCV 83.1 04/02/2016   PLT 454 (H) 04/02/2016    BMET    Component Value Date/Time   NA 136 04/02/2016 1513   NA 137 03/08/2016   NA 136 07/17/2014 0417   K 3.3 (L) 04/02/2016 1513   K 3.5 07/17/2014 0417    CL 96 (L) 04/02/2016 1513   CL 103 07/17/2014 0417   CO2 30 04/02/2016 1513   CO2 26 07/17/2014 0417   GLUCOSE 160 (H) 04/02/2016 1513   GLUCOSE 221 (H) 07/17/2014 0417   BUN 21 (H) 04/02/2016 1513   BUN 25 (A) 03/08/2016   BUN 32 (H) 07/17/2014 0417   CREATININE 3.21 (H) 04/02/2016 1513   CREATININE 3.45 (H) 07/17/2014 0417   CALCIUM 8.8 (L) 04/02/2016 1513   CALCIUM 8.4 (L) 07/17/2014 0417   GFRNONAA 16 (L) 04/02/2016 1513   GFRNONAA 15 (L) 07/17/2014 0417   GFRAA 18 (L) 04/02/2016 1513   GFRAA 17 (L) 07/17/2014 0417   Estimated Creatinine Clearance: 27.6 mL/min (by C-G formula based on SCr of 3.21 mg/dL (H)).  COAG Lab Results  Component Value Date   INR 1.03 12/25/2015   INR 0.96 11/19/2015   INR 0.93 10/05/2015    Radiology Ct Abdomen Pelvis Wo Contrast  Result Date: 03/13/2016 CLINICAL DATA:  51 year old female with a history of lower abdominal pain for 1 day EXAM: CT ABDOMEN AND PELVIS WITHOUT CONTRAST TECHNIQUE: Multidetector CT imaging of the abdomen and pelvis was performed following the standard protocol without IV contrast. COMPARISON:  Chest x-ray 03/13/2016, CT 07/15/2015 FINDINGS: Lower chest: Small left pleural effusion with associated atelectasis. Calcifications of native coronary vasculature Hepatobiliary: No focal liver abnormality is seen. No gallstones, gallbladder wall thickening, or biliary dilatation.Unremarkable gallbladder. Pancreas: Atrophic pancreatic parenchyma. Spleen: Normal in size without focal abnormality. Adrenals/Urinary Tract: Right adrenal gland unremarkable.  Left adrenal gland unremarkable. Right kidney: No right-sided hydronephrosis.  Vascular calcifications. Left Kidney: No left-sided hydronephrosis or nephrolithiasis. No significant stranding. Course of the bilateral ureters unremarkable. Unremarkable appearance of the urinary bladder. Stomach/Bowel: Stomach partially distended  with enteric contrast. Small hiatal hernia. Unremarkable  appearance of small bowel. No abnormally distended small bowel. Normal appendix, although high density contents within the lumen. No associated inflammation. Circumferential wall thickening of the distal sigmoid colon and rectum extending nearly to the anal verge. There is associated infiltration/ edema within the adjacent fat. Small volume formed stool within the rectum. More proximal colon is relatively decompressed without evidence of transition point or obstruction. Colonic diverticular present without changes to suggest acute diverticulitis. Vascular/Lymphatic: Dense calcifications of the abdominal aorta and the mesenteric vessels. Calcifications extend into the iliac vessels, proximal femoral vessels, and the pelvic vasculature. There has been interval development of borderline enlarged lymph nodes in the lymphatic drainage pathway of the rectum through the IMA/IMV vasculature. Largest index lymph node at the level of the sacral base, new from the prior, measuring 12 mm. Reproductive: Gas within the vagina extending to the cervix with complex debris of unknown etiology. Unremarkable uterus and adnexa. Other: Extensive infiltration of the body wall with edema/ anasarca. Interval removal of the peritoneal dialysis catheter. High density material anterior to the right abdominal wall musculature measuring 60- 70 Hounsfield units. Musculoskeletal: Degenerative changes of the visualized spine. Endplate irregularity at inferior L3, superior L4, which was present on the comparison CT. Most likely these represent Schmorl nodes. Degenerative changes of the bilateral hips. IMPRESSION: Circumferential inflammatory changes/ thickening of the rectum, with associated edema/ inflammation of the adjacent fat. Findings are most suggestive of colitis, potentially inflammatory/infectious etiology. Recommend correlation with patient presentation and lab values. Compared to the CT of April 2017, there is new lymphadenopathy in the  lymphatic drainage of the rectum, following the IMV/IMA vasculature. Index lymph node at the sacral base measures 12 mm. While this is most likely reactive inflammatory adenopathy, referral for colonoscopy is recommended, as an occult colon cancer could have this appearance/presentation. No evidence of colonic obstruction or fecal impaction. Extensive body wall anasarca/ edema. Recommend correlation with patient's fluid status. There is associated small left basilar pleural effusion. Interval removal of peritoneal dialysis catheter since the prior CT. High density material within the subcutaneous fat overlying the right abdominal wall may represent blood products. Gas/ debris within the vagina extending to the cervix, of uncertain etiology. A colo-vaginal fistula is unlikely, although could have this presentation, and correlation with patient presentation may be useful. Aortic atherosclerosis.  Associated coronary artery disease. Signed, Yvone Neu. Loreta Ave, DO [facility] Radiology Electronically Signed   By: Gilmer Mor D.O.   On: 03/13/2016 19:21   Ct Abdomen Pelvis W Contrast  Result Date: 04/02/2016 CLINICAL DATA:  Right lower quadrant pain and operative site. Erythema and warmth to touch. Old peritoneal dialysis catheter was removed approximately 2 weeks ago and reports pain and erythema x 4-5 days since. EXAM: CT ABDOMEN AND PELVIS WITH CONTRAST TECHNIQUE: Multidetector CT imaging of the abdomen and pelvis was performed using the standard protocol following bolus administration of intravenous contrast. CONTRAST:  ISOVUE-300 IOPAMIDOL (ISOVUE-300) INJECTION 61% COMPARISON:  03/13/2016 CT FINDINGS: Lower chest: Bilateral small pleural effusions with compressive atelectasis. Top normal-sized cardiac chambers. Coronary arteriosclerosis. Hepatobiliary: Negative Pancreas: Pancreatic atrophy without focal mass or ductal dilatation. Spleen: Negative.  Small  splenule. Adrenals/Urinary Tract: Adrenal glands are unremarkable. Kidneys are normal, without renal calculi, focal lesion, or hydronephrosis. Renovascular calcifications are again noted bilaterally. No significant cortical thinning. Mild nonspecific perinephric fat stranding as before. Urinary bladder is physiologically distended without acute abnormality. Stomach/Bowel: Moderate colonic stool burden. Mild  circumferential rectal thickening surrounding the stool may reflect stercoral colitis. No bowel obstruction. No small bowel inflammation. Normal-appearing appendix. Moderate distention of the AA stomach with contrast. Normal small bowel rotation. Small hiatal hernia. Vascular/Lymphatic: Mild aortoiliac and branch vessel atherosclerosis. No lymphadenopathy by CT criteria. Reproductive: Uterus and adnexa demonstrate no acute appearing abnormality. 2 cm follicular size cyst on the left. Other: Diffuse anasarca is again noted. Within the right lower quadrant subcutaneous fat is soft tissue induration and ill-defined subtle hyperdensity measuring 5.1 x 2.6 x 3 cm likely representing sequela of prior subcutaneous hematoma now with Hounsfield unit of 40, previously 60-70. No abscess noted. No ascites. No free air. Musculoskeletal: Nonacute. Degenerative changes of the dorsal spine and both hips. IMPRESSION: No focal abscess at site of peritoneal dialysis catheter removal. Ill-defined subcutaneous hyperdensity likely represents blood products from prior hematoma. No active hemorrhage identified. Diffuse anasarca noted. Less pronounced perirectal thickening surrounding stool. Mild stercoral colitis is not entirely excluded. Bilateral small pleural effusions are now noted with adjacent atelectasis. Aortic atherosclerosis with coronary arteriosclerosis. Electronically Signed   By: Tollie Eth M.D.   On: 04/02/2016 18:02   Dg Chest Portable 1 View  Result Date: 03/13/2016 CLINICAL DATA:  Acute hypotensive episode today  after dialysis. EXAM: PORTABLE CHEST 1 VIEW COMPARISON:  03/03/2016 FINDINGS: Heart size remains at the upper limits of normal. Left jugular dual-lumen central venous catheter remains in appropriate position. No evidence of pneumothorax. No evidence of congestive heart failure. Opacity is seen in the left retrocardiac lung base silhouetting the left hemidiaphragm, which may be due to atelectasis or infiltrate. IMPRESSION: Retrocardiac left lower lobe atelectasis versus infiltrate. Electronically Signed   By: Myles Rosenthal M.D.   On: 03/13/2016 16:58    Assessment/Plan 1. ESRD on dialysis [facility]) Recommend:  At this time the patient does not have appropriate extremity access for dialysis. Currently she is using her catheter for dialysis.  Patient should have a right brachial cephalic fistula created.  I acknowledged with the patient that she is right-handed but discussed the benefits of having an excellent fistula versus placing a graft in her nondominant arm. She voices understanding and wishes to proceed.  The risks, benefits and alternative therapies were reviewed in detail with the patient.  All questions were answered.  The patient agrees to proceed with surgery.    2. Complication from renal dialysis device, sequela See #1  3. Coronary artery disease involving native coronary artery of native heart with angina pectoris (HCC) Continue cardiac and antihypertensive medications as already ordered and reviewed, no changes at this time.  Continue statin as ordered and reviewed, no changes at this time  Nitrates PRN for chest pain   4. Essential hypertension Continue antihypertensive medications as already ordered, these medications have been reviewed and there are no changes at this time.   5. Type 2 diabetes mellitus with chronic kidney disease on chronic dialysis, with long-term current use of insulin (HCC) Continue hypoglycemic medications as already ordered, these medications have been  reviewed and there are no changes at this time.  Hgb A1C to be monitored as already arranged by primary service   6. Mixed hyperlipidemia Continue statin as ordered and reviewed, no changes at this time     Levora Dredge, MD  04/08/2016 12:01 PM

## 2016-04-09 ENCOUNTER — Other Ambulatory Visit (INDEPENDENT_AMBULATORY_CARE_PROVIDER_SITE_OTHER): Payer: Self-pay | Admitting: Vascular Surgery

## 2016-04-10 ENCOUNTER — Encounter
Admission: RE | Admit: 2016-04-10 | Discharge: 2016-04-10 | Disposition: A | Discharge status: Home / Self Care | Payer: Medicare HMO | Source: Ambulatory Visit | Attending: Vascular Surgery | Admitting: Vascular Surgery

## 2016-04-10 DIAGNOSIS — N186 End stage renal disease: Secondary | ICD-10-CM | POA: Diagnosis not present

## 2016-04-10 DIAGNOSIS — Z01818 Encounter for other preprocedural examination: Secondary | ICD-10-CM | POA: Diagnosis not present

## 2016-04-10 LAB — BASIC METABOLIC PANEL
Anion gap: 9 (ref 5–15)
BUN: 38 mg/dL — AB (ref 6–20)
CHLORIDE: 101 mmol/L (ref 101–111)
CO2: 28 mmol/L (ref 22–32)
CREATININE: 3.7 mg/dL — AB (ref 0.44–1.00)
Calcium: 9.3 mg/dL (ref 8.9–10.3)
GFR calc Af Amer: 15 mL/min — ABNORMAL LOW (ref >60)
GFR calc non Af Amer: 13 mL/min — ABNORMAL LOW (ref >60)
Glucose, Bld: 133 mg/dL — ABNORMAL HIGH (ref 65–99)
Potassium: 3.3 mmol/L — ABNORMAL LOW (ref 3.5–5.1)
SODIUM: 138 mmol/L (ref 135–145)

## 2016-04-10 LAB — SURGICAL PCR SCREEN
MRSA, PCR: NEGATIVE
Staphylococcus aureus: NEGATIVE

## 2016-04-10 LAB — CBC WITH DIFFERENTIAL/PLATELET
Basophils Absolute: 0 10*3/uL (ref 0–0.1)
Basophils Relative: 0 %
EOS ABS: 0.3 10*3/uL (ref 0–0.7)
EOS PCT: 4 %
HCT: 30.7 % — ABNORMAL LOW (ref 35.0–47.0)
Hemoglobin: 9.7 g/dL — ABNORMAL LOW (ref 12.0–16.0)
LYMPHS ABS: 1.1 10*3/uL (ref 1.0–3.6)
Lymphocytes Relative: 15 %
MCH: 27.2 pg (ref 26.0–34.0)
MCHC: 31.8 g/dL — AB (ref 32.0–36.0)
MCV: 85.7 fL (ref 80.0–100.0)
MONO ABS: 0.6 10*3/uL (ref 0.2–0.9)
MONOS PCT: 9 %
Neutro Abs: 5.5 10*3/uL (ref 1.4–6.5)
Neutrophils Relative %: 72 %
PLATELETS: 383 10*3/uL (ref 150–440)
RBC: 3.58 MIL/uL — ABNORMAL LOW (ref 3.80–5.20)
RDW: 16 % — ABNORMAL HIGH (ref 11.5–14.5)
WBC: 7.5 10*3/uL (ref 3.6–11.0)

## 2016-04-10 LAB — TYPE AND SCREEN
ABO/RH(D): A POS
Antibody Screen: NEGATIVE

## 2016-04-10 LAB — APTT: aPTT: 29 seconds (ref 24–36)

## 2016-04-10 LAB — PROTIME-INR
INR: 1
Prothrombin Time: 13.2 seconds (ref 11.4–15.2)

## 2016-04-10 NOTE — Patient Instructions (Signed)
Your procedure is scheduled on: Friday 04/19/16 Report to DAY SURGERY. 2ND FLOOR MEDICAL MALL ENTRANCE. To find out your arrival time please call [phone] between 1PM - 3PM on Thursday 04/18/16.  Remember: Instructions that are not followed completely may result in serious medical risk, up to and including death, or upon the discretion of your surgeon and anesthesiologist your surgery may need to be rescheduled.    __X__ 1. Do not eat food or drink liquids after midnight. No gum chewing or hard candies.     __X__ 2. No Alcohol for 24 hours before or after surgery.   ____ 3. Bring all medications with you on the day of surgery if instructed.    __X__ 4. Notify your doctor if there is any change in your medical condition     (cold, fever, infections).             ___X__5. No smoking within 24 hours of your surgery.     Do not wear jewelry, make-up, hairpins, clips or nail polish.  Do not wear lotions, powders, or perfumes.   Do not shave 48 hours prior to surgery. Men may shave face and neck.  Do not bring valuables to the hospital.    [facility] is not responsible for any belongings or valuables.               Contacts, dentures or bridgework may not be worn into surgery.  Leave your suitcase in the car. After surgery it may be brought to your room.  For patients admitted to the hospital, discharge time is determined by your                treatment team.   Patients discharged the day of surgery will not be allowed to drive home.   Please read over the following fact sheets that you were given:   Pain Booklet and MRSA Information   __X__ Take these medicines the morning of surgery with A SIP OF WATER:    1. REGLAN  2. LOPRESSOR  3. KEPPRA  4. RISPERDAL  5. WELLBUTRIN  6. ISOSORBIDE  7. LISINOPRIL  8. NEURONTIN  MAY HAVE PAIN MED IF NEEDED  ____ Fleet Enema (as directed)   __X__ Use CHG Soap as directed  ____ Use inhalers on the day of surgery  ____ Stop metformin  2 days prior to surgery    __X__ Take 1/2 of usual insulin dose the night before surgery and none on the morning of surgery.   ____ Stop Coumadin/Plavix/aspirin on   __X__ Stop Anti-inflammatories such as Advil, Aleve, Ibuprofen, Motrin, Naproxen, Naprosyn, Goodies,powder, or aspirin products.  OK to take Tylenol.   ____ Stop supplements until after surgery.    ____ Bring C-Pap to the hospital.

## 2016-04-10 NOTE — Pre-Procedure Instructions (Signed)
REQUEST FOR MEDICAL AND CARDIAC CLEARANCE,AS INSTRUCTED BY DR P CARROLL CALLED AND FAXED TO [place] AT [facility]

## 2016-04-15 ENCOUNTER — Non-Acute Institutional Stay (SKILLED_NURSING_FACILITY): Payer: Medicare HMO | Admitting: Internal Medicine

## 2016-04-15 ENCOUNTER — Encounter: Payer: Self-pay | Admitting: Internal Medicine

## 2016-04-15 DIAGNOSIS — I10 Essential (primary) hypertension: Secondary | ICD-10-CM | POA: Diagnosis not present

## 2016-04-15 DIAGNOSIS — I25119 Atherosclerotic heart disease of native coronary artery with unspecified angina pectoris: Secondary | ICD-10-CM | POA: Diagnosis not present

## 2016-04-15 DIAGNOSIS — I5032 Chronic diastolic (congestive) heart failure: Secondary | ICD-10-CM | POA: Diagnosis not present

## 2016-04-15 DIAGNOSIS — Z794 Long term (current) use of insulin: Secondary | ICD-10-CM

## 2016-04-15 DIAGNOSIS — N186 End stage renal disease: Secondary | ICD-10-CM

## 2016-04-15 DIAGNOSIS — Z01818 Encounter for other preprocedural examination: Secondary | ICD-10-CM | POA: Diagnosis not present

## 2016-04-15 DIAGNOSIS — Z992 Dependence on renal dialysis: Secondary | ICD-10-CM

## 2016-04-15 DIAGNOSIS — E1122 Type 2 diabetes mellitus with diabetic chronic kidney disease: Secondary | ICD-10-CM

## 2016-04-15 NOTE — Progress Notes (Signed)
LOCATION: [facility]   PCP: Jarome Matin, MD   Code Status: Full Code   Goals of care: Advanced Directive information Advanced Directives 04/10/2016  Does Patient Have a Medical Advance Directive? No  Would patient like information on creating a medical advance directive? No - Patient declined       Extended Emergency Contact Information Primary Emergency Contact: Dixon,Heather N Address: [address]          [place], [place] [address] [place] Phone: [phone] Relation: Daughter   Allergies  Allergen Reactions  . Cephalosporins Anaphylaxis    Patient has tolerated meropenem.   [name] Penicillins Anaphylaxis and Other (See Comments)    Has patient had a PCN reaction causing immediate rash, facial/tongue/throat swelling, SOB or lightheadedness with hypotension: Yes Has patient had a PCN reaction causing severe rash involving mucus membranes or skin necrosis: No Has patient had a PCN reaction that required hospitalization No Has patient had a PCN reaction occurring within the last 10 years: No If all of the above answers are "NO", then may proceed with Cephalosporin use.  . Lamictal [Lamotrigine] Other (See Comments)    Reaction:  Hallucinations  . Phenergan [Promethazine Hcl] Nausea And Vomiting  . Pravastatin Other (See Comments)    Reaction:  Muscle pain   . Sulfa Antibiotics Other (See Comments)    Reaction:  Unknown     Chief Complaint  Patient presents with  . Acute Visit    Surgery Clearance     HPI:  Patient is a 51 y.o. female seen today for acute visit. She is to undergo surgery for right arteriovenous fistula placement on 19th of January 2018. Anesthesia and surgery have requested medical clearance form to be filled out. Patient is being seen today for this reason. She is currently on dialysis through dialysis catheter on her chest wall. She has medical history of end-stage renal disease, coronary artery disease, type 2 diabetes  mellitus, morbid obesity among others.  Review of Systems:  Constitutional: Negative for fever HENT: Negative for headache, congestion, nasal discharge, dysphagia Eyes: Negative for blurred vision, double vision and discharge.  Respiratory: Negative for cough, shortness of breath and wheezing. On chronic oxygen by nasal cannula.   Cardiovascular: Negative for palpitations. has occasional chest pain and nitrates have been helpful. Gastrointestinal: Negative for heartburn, nausea, vomiting, abdominal pain.  Genitourinary: Negative for dysuria Musculoskeletal: Negative for back pain, fall in the facility.  Skin: Negative for itching, rash.  Neurological: Negative for dizziness. Psychiatric/Behavioral: Negative for depression.   Past Medical History:  Diagnosis Date  . Anginal pain (HCC)   . Anxiety   . Bipolar disorder (HCC)   . CAD (coronary artery disease)   . CHF (congestive heart failure) (HCC)   . Chronic lower back pain   . Depression   . Endometriosis   . ESRD (end stage renal disease) on dialysis (HCC)    "[facility]; [address]; ; TTS" (01/19/2016)  . Gastroparesis   . GERD (gastroesophageal reflux disease)   . History of blood transfusion "several"   "my blood would get low; low RBC"  . History of hiatal hernia   . HLD (hyperlipidemia)   . Hypertension   . Migraine    "monthly" (01/19/2016)  . Myocardial infarction 2017   "~ 3 wks ago" (01/19/2016)  . Renal disorder   . Renal insufficiency   . Seizures (HCC) 07/2015   "I've only had the 1; don't know what  from" (01/19/2016)  . Type II diabetes mellitus (HCC)    Past Surgical History:  Procedure Laterality Date  . ABDOMINAL HYSTERECTOMY     "partial"  . BELOW KNEE LEG AMPUTATION Right 2010?  [name] CARDIAC CATHETERIZATION Right 07/10/2015   Procedure: Left Heart Cath and Coronary Angiography;  Surgeon: Laurier Nancy, MD;  Location: [facility] INVASIVE CV LAB;  Service: Cardiovascular;  Laterality: Right;  .  CARDIAC CATHETERIZATION Right 09/14/2015   Procedure: Left Heart Cath and Coronary Angiography;  Surgeon: Laurier Nancy, MD;  Location: [facility] INVASIVE CV [facility];  Service: Cardiovascular;  Laterality: Right;  . CARDIAC CATHETERIZATION N/A 09/14/2015   Procedure: Coronary Stent Intervention;  Surgeon: Alwyn Pea, MD;  Location: [facility] INVASIVE CV LAB;  Service: Cardiovascular;  Laterality: N/A;  . CARDIAC CATHETERIZATION Right 11/20/2015   Procedure: Left Heart Cath and Coronary Angiography;  Surgeon: Laurier Nancy, MD;  Location: [facility] CV [facility];  Service: Cardiovascular;  Laterality: Right;  . CORONARY ANGIOPLASTY WITH STENT PLACEMENT  <2017   @ [facility]/notes 07/03/2013  . HYSTEROTOMY    . PERIPHERAL VASCULAR CATHETERIZATION N/A 08/30/2015   Procedure: Dialysis/Perma Catheter Insertion;  Surgeon: Annice Needy, MD;  Location: [facility] CV [facility];  Service: Cardiovascular;  Laterality: N/A;  . PERIPHERAL VASCULAR CATHETERIZATION N/A 01/01/2016   Procedure: Dialysis/Perma Catheter Insertion;  Surgeon: Annice Needy, MD;  Location: [facility] INVASIVE CV LAB;  Service: Cardiovascular;  Laterality: N/A;  . PERITONEAL CATHETER INSERTION  11/03/2013   [name] 11/03/2013  . PERITONEAL CATHETER REMOVAL  01/01/2016   "took the one from May out; put new PD cath in" (01/19/2016)  . PERITONEAL CATHETER REMOVAL  11/03/2013; 02/11/2014   [name] 11/03/2013; Removal of tunneled catheter/notes 02/11/2014  . SALPINGOOPHORECTOMY Right    [name] 07/03/2013  . TUBAL LIGATION     Social History:   reports that she quit smoking about 9 months ago. Her smoking use included Cigarettes. She smoked 0.75 packs per day. She has never used smokeless tobacco. She reports that she drinks alcohol. She reports that she does not use drugs.  Family History  Problem Relation Age of Onset  . CAD    . Diabetes    . Bipolar disorder    . Cervical cancer Mother     Medications: Allergies as of 04/15/2016      Reactions   Cephalosporins  Anaphylaxis   Patient has tolerated meropenem.    Penicillins Anaphylaxis, Other (See Comments)   Has patient had a PCN reaction causing immediate rash, facial/tongue/throat swelling, SOB or lightheadedness with hypotension: Yes Has patient had a PCN reaction causing severe rash involving mucus membranes or skin necrosis: No Has patient had a PCN reaction that required hospitalization No Has patient had a PCN reaction occurring within the last 10 years: No If all of the above answers are "NO", then may proceed with Cephalosporin use.   Lamictal [lamotrigine] Other (See Comments)   Reaction:  Hallucinations   Phenergan [promethazine Hcl] Nausea And Vomiting   Pravastatin Other (See Comments)   Reaction:  Muscle pain    Sulfa Antibiotics Other (See Comments)   Reaction:  Unknown       Medication List       Accurate as of 04/15/16  1:04 PM. Always use your most recent med list.          amLODipine 10 MG tablet Commonly known as:  NORVASC Take 10 mg by mouth daily.   aspirin EC 81 MG tablet Take 81 mg  by mouth daily.   aspirin EC 81 MG tablet Take 81 mg by mouth daily. Stop date 04/16/16   buPROPion 200 MG 12 hr tablet Commonly known as:  WELLBUTRIN SR Take 200 mg by mouth daily.   calcium carbonate 500 MG chewable tablet Commonly known as:  TUMS - dosed in mg elemental calcium Chew 1 tablet (200 mg of elemental calcium total) by mouth 3 (three) times daily with meals.   clopidogrel 75 MG tablet Commonly known as:  PLAVIX Take 75 mg by mouth daily.   feeding supplement (PRO-STAT SUGAR FREE 64) Liqd Take 30 mLs by mouth 3 (three) times daily with meals.   fluticasone 50 MCG/ACT nasal spray Commonly known as:  FLONASE Place 1 spray into both nostrils daily.   gabapentin 300 MG capsule Commonly known as:  NEURONTIN Take 300 mg by mouth 3 (three) times daily.   hydrALAZINE 25 MG tablet Commonly known as:  APRESOLINE Take 25 mg by mouth daily.   ICY HOT LIDOCAINE  PLUS MENTHOL 4-1 % Ptch Generic drug:  Lidocaine-Menthol Apply 2 patches topically daily. Apply 2 patches to lumbar region QAM, remove after 12 hours   insulin aspart 100 UNIT/ML injection Commonly known as:  novoLOG Inject 10 Units into the skin 3 (three) times daily with meals.   insulin aspart 100 UNIT/ML injection Commonly known as:  novoLOG Inject 5 Units into the skin 3 (three) times daily before meals. Stop date 04/19/16   insulin glargine 100 UNIT/ML injection Commonly known as:  LANTUS Inject 4 Units into the skin at bedtime. Stop date 04/19/16   insulin glargine 100 UNIT/ML injection Commonly known as:  LANTUS Inject 0.08 mLs (8 Units total) into the skin at bedtime.   isosorbide mononitrate 60 MG 24 hr tablet Commonly known as:  IMDUR Take 1.5 tablets (90 mg total) by mouth daily.   lactulose 10 GM/15ML solution Commonly known as:  CHRONULAC Take 10 g by mouth daily.   levETIRAcetam 500 MG tablet Commonly known as:  KEPPRA Take 1 tablet (500 mg total) by mouth every morning.   lisinopril 10 MG tablet Commonly known as:  PRINIVIL,ZESTRIL Take 1 tablet (10 mg total) by mouth daily.   metoCLOPramide 10 MG tablet Commonly known as:  REGLAN Take 10 mg by mouth 4 (four) times daily.   metoprolol 50 MG tablet Commonly known as:  LOPRESSOR Take 1 tablet (50 mg total) by mouth 2 (two) times daily.   multivitamin Tabs tablet Take 1 tablet by mouth daily.   mupirocin cream 2 % Commonly known as:  BACTROBAN Apply 1 application topically 2 (two) times daily. 1 application to nose two times daily   nitroGLYCERIN 0.4 MG SL tablet Commonly known as:  NITROSTAT Place 0.4 mg under the tongue every 5 (five) minutes as needed for chest pain. Reported on 07/10/2015   omeprazole 20 MG capsule Commonly known as:  PRILOSEC Take 20 mg by mouth 2 (two) times daily before a meal.   ondansetron 4 MG disintegrating tablet Commonly known as:  ZOFRAN ODT Take 1 tablet (4 mg  total) by mouth every 6 (six) hours as needed for nausea or vomiting.   oxyCODONE-acetaminophen 5-325 MG tablet Commonly known as:  PERCOCET/ROXICET Take one tablet by mouth every 6 hours as needed for moderate pain. Do not exceed 4gm of Tylenol in 24 hours   polyethylene glycol packet Commonly known as:  MIRALAX / GLYCOLAX Take 17 g by mouth daily.   risperiDONE 1 MG tablet Commonly  known as:  RISPERDAL Take 1 mg by mouth 3 (three) times daily.   senna 8.6 MG Tabs tablet Commonly known as:  SENOKOT Take 2 tablets by mouth 2 (two) times daily.   sevelamer carbonate 800 MG tablet Commonly known as:  RENVELA Take 800 mg by mouth 4 (four) times daily. Take 3 times with meals and 1 with snacks.   torsemide 100 MG tablet Commonly known as:  DEMADEX Take 100 mg by mouth daily.       Immunizations: Immunization History  Administered Date(s) Administered  . Influenza, Quadrivalent, Recombinant, Inj, Pf 01/25/2013  . Influenza, Seasonal, Injecte, Preservative Fre 01/02/2012  . Influenza-Unspecified 02/27/2016  . PPD Test 02/27/2016  . Pneumococcal Polysaccharide-23 07/23/2011  . Td 06/08/2007     Physical Exam: Vitals:   04/15/16 1248  BP: (!) 144/72  Pulse: 74  Resp: 20  Temp: 98.2 F (36.8 C)  TempSrc: Oral  SpO2: 96%  Weight: 251 lb (113.9 kg)  Height: 5\' 9"  (1.753 m)   Body mass index is 37.07 kg/m.  General- adult female, obese, in no acute distress Head- normocephalic, atraumatic Nose- no nasal discharge Throat- moist mucus membrane Eyes- PERRLA, EOMI, no pallor, no icterus Neck- no cervical lymphadenopathy Cardiovascular- normal s1,s2, no murmur, no leg edema Respiratory- bilateral clear to auscultation, no wheeze, no rhonchi, no crackles, no use of accessory muscles, on 3l o2 by nasal canula.  Abdomen- bowel sounds present, soft, non tender Musculoskeletal- right BKA, able to move all other 3 extremities, generalized weakness Neurological- alert and  oriented to person, place and time Skin- warm and dry, left chest wall dialysis catheter with dressing Psychiatry- normal mood and affect    Labs reviewed: Basic Metabolic Panel:  Recent Labs  40/98/11 0547  07/21/15 0427  11/19/15 1547  01/04/16 1055  01/20/16 0820  02/27/16 1025  03/16/16 0541 04/02/16 1513 04/10/16 1154  NA 140  < > 128*  < >  --   < > 130*  < > 137  < >  --   < > 131* 136 138  K 4.7  < > 4.2  < >  --   < > 3.9  < > 3.6  < >  --   < > 3.8 3.3* 3.3*  CL 109  < > 91*  < >  --   < > 91*  < > 99*  < >  --   < > 99* 96* 101  CO2 22  < > 22  < >  --   < > 29  < > 31  < >  --   < > 28 30 28   GLUCOSE 181*  < > 157*  < >  --   < > 147*  < > 107*  < >  --   < > 96 160* 133*  BUN 64*  < > 62*  < >  --   < > 51*  < > 28*  < >  --   < > 31* 21* 38*  CREATININE 7.10*  < > 9.89*  < >  --   < > 5.58*  < > 3.91*  < >  --   < > 4.06* 3.21* 3.70*  CALCIUM 7.8*  < > 5.9*  < >  --   < > 10.5*  < > 9.2  < >  --   < > 8.9 8.8* 9.3  MG 2.4  --  1.9  --  1.9  --   --   --   --   --   --   --   --   --   --  PHOS 8.0*  8.0*  < >  --   < >  --   < > 3.5  --  4.7*  --  4.9*  --   --   --   --   < > = values in this interval not displayed. Liver Function Tests:  Recent Labs  12/25/15 1434  01/20/16 0820 01/26/16 02/25/16 1328 03/13/16 1612  AST 17  < >  --  8* 28 12*  ALT 9*  --   --  3* 9* 8*  ALKPHOS 66  < >  --  88 70 73  BILITOT 0.5  --   --   --  0.7 0.5  PROT 5.0*  --   --   --  5.6* 5.7*  ALBUMIN 2.2*  < > 1.9*  --  2.5* 2.4*  < > = values in this interval not displayed.  Recent Labs  07/15/15 1639 08/23/15 2319 03/13/16 1612  LIPASE 23 14 15     Recent Labs  08/23/15 2319 02/25/16 1449  AMMONIA 29 29   CBC:  Recent Labs  03/03/16 2155  03/13/16 1612  03/16/16 0541 04/02/16 1513 04/10/16 1154  WBC 9.6  < > 6.2  < > 5.4 7.1 7.5  NEUTROABS 6.9  --  4.4  --   --   --  5.5  HGB 12.1  < > 11.4*  < > 9.4* 9.5* 9.7*  HCT 38.6  < > 35.5  < > 28.4* 28.5*  30.7*  MCV 85.4  --  84.8  < > 84.9 83.1 85.7  PLT 364  < > 308  < > 248 454* 383  < > = values in this interval not displayed. Cardiac Enzymes:  Recent Labs  03/14/16 0116 03/14/16 0742 03/14/16 1301  TROPONINI 0.03* 0.04* 0.06*   BNP: Invalid input(s): POCBNP CBG:  Recent Labs  03/15/16 2056 03/16/16 0755 03/16/16 1204  GLUCAP 139* 91 111*    Radiological Exams: Ct Abdomen Pelvis W Contrast  Result Date: 04/02/2016 CLINICAL DATA:  Right lower quadrant pain and operative site. Erythema and warmth to touch. Old peritoneal dialysis catheter was removed approximately 2 weeks ago and reports pain and erythema x 4-5 days since. EXAM: CT ABDOMEN AND PELVIS WITH CONTRAST TECHNIQUE: Multidetector CT imaging of the abdomen and pelvis was performed using the standard protocol following bolus administration of intravenous contrast. CONTRAST:  ISOVUE-300 IOPAMIDOL (ISOVUE-300) INJECTION 61% COMPARISON:  03/13/2016 CT FINDINGS: Lower chest: Bilateral small pleural effusions with compressive atelectasis. Top normal-sized cardiac chambers. Coronary arteriosclerosis. Hepatobiliary: Negative Pancreas: Pancreatic atrophy without focal mass or ductal dilatation. Spleen: Negative.  Small splenule. Adrenals/Urinary Tract: Adrenal glands are unremarkable. Kidneys are normal, without renal calculi, focal lesion, or hydronephrosis. Renovascular calcifications are again noted bilaterally. No significant cortical thinning. Mild nonspecific perinephric fat stranding as before. Urinary bladder is physiologically distended without acute abnormality. Stomach/Bowel: Moderate colonic stool burden. Mild circumferential rectal thickening surrounding the stool may reflect stercoral colitis. No bowel obstruction. No small bowel inflammation. Normal-appearing appendix. Moderate distention of the AA stomach with contrast. Normal small bowel rotation. Small hiatal hernia. Vascular/Lymphatic: Mild aortoiliac and branch  vessel atherosclerosis. No lymphadenopathy by CT criteria. Reproductive: Uterus and adnexa demonstrate no acute appearing abnormality. 2 cm follicular size cyst on the left. Other: Diffuse anasarca is again noted. Within the right lower quadrant subcutaneous fat is soft tissue induration and ill-defined subtle hyperdensity measuring 5.1 x 2.6 x 3 cm likely representing sequela of prior subcutaneous hematoma now  with Hounsfield unit of 40, previously 60-70. No abscess noted. No ascites. No free air. Musculoskeletal: Nonacute. Degenerative changes of the dorsal spine and both hips. IMPRESSION: No focal abscess at site of peritoneal dialysis catheter removal. Ill-defined subcutaneous hyperdensity likely represents blood products from prior hematoma. No active hemorrhage identified. Diffuse anasarca noted. Less pronounced perirectal thickening surrounding stool. Mild stercoral colitis is not entirely excluded. Bilateral small pleural effusions are now noted with adjacent atelectasis. Aortic atherosclerosis with coronary arteriosclerosis. Electronically Signed   By: Tollie Eth M.D.   On: 04/02/2016 18:02    Assessment/Plan  CAD Chest pain free at present but has had intermittent chest pain controlled by nitroglycerin. NSTEMI in 9/17. Currently on metoprolol tartrate, isosorbide mononitrate, lisinopril, aspirin and Plavix with nitroglycerin on a needed basis. Cardiac cath 11/20/2015 showing patent stent and medical management was recommended. Reviewed EKG 03/17/2016.  Chronic diastolic congestive heart failure Reviewed echocardiogram from 09/13/2015 showing mild diastolic dysfunction but normal wall motion and ejection fraction 65%. Currently patient appears compensated. Continue metoprolol tartrate, isosorbide mononitrate, hydralazine, lisinopril and oxygen by nasal cannula.  Hypertension Reviewed blood pressure reading. Reviewed medication. Will make no changes to her blood pressure medication at  present.  Diabetes mellitus type 2 with renal impairment Continue current regimen of Lantus at bedtime and sliding scale insulin NovoLog with meals. Lab Results  Component Value Date   HGBA1C 6.2 (H) 09/13/2015   Preoperative clearance She has medical history of coronary artery disease and has required NTG for anginal pain, chronic diastolic congestive heart failure, obesity, diabetes mellitus on insulin, end-stage renal disease and hypertension among others. Plan is for her to have an arteriovenous fistula placement which is a low risk procedure. But, patient has several independent predictors of major cardiac complications and has poor functional status that increases risk for major adverse cardiovascular event perioperatively. Will get cardiac clearance from her cardiologist Dr Adrian Blackwater.   Family/ staff Communication: reviewed care plan with patient and nursing supervisor    Oneal Grout, MD Internal Medicine [facility] [address] [place] [employer] [place], [place] [address] Cell Phone (Monday-Friday 8 am - 5 pm): [phone] On Call: [phone] and follow prompts after 5 pm and on weekends Office Phone: [phone] Office Fax: [phone]

## 2016-04-18 NOTE — Progress Notes (Signed)
Cardiac clearance still pending from Dr. Milta Deiters, cardiologist. A request form was faxed to Dr. Marijean Heath office on 04/11/15 stating the need for such clearance. Dr. Milta Deiters office was notified on 04/17/16 that clearance has not been obtained up to this point. Due to weather conditions; Dr. Milta Deiters and Dr. Gilda Crease office was closed so no documents were received to clear the patient for surgery. [facility] facility was called to see if they had obtained cardiac clearance; they had not. Dr. Randa Ngo was notified of this and said that it would be up to the anesthesiologist who was assigned to the case tomorrow whether they would agree to go ahead with the surgery after their assessment of the patient. Dr. Gilda Crease notified of the above events and recommended that the patient be asked if she wanted to come in to same day surgery tomorrow and have anesthesia evaluate her to see if she was able to have the surgery. Several unsuccessful phone calls made to [facility]. The scheduler from [facility] returned a message that was left. Apparently the patient had an appointment with Dr.S. Welton Flakes today to get cardiac clearance but due to bad weather conditions, Dr. Welton Flakes office was closed. The patient is currently at the dialysis center and was unavailable to ask what she wishes to do. The scheduler will speak with the director of nursing and return my call. Return call from [facility]; they had spoken to the patient's daughter and the daughter made the decision to cancel surgery until the patient had cardiac clearance.

## 2016-04-19 ENCOUNTER — Ambulatory Visit: Admission: RE | Admit: 2016-04-19 | Payer: Medicare HMO | Source: Ambulatory Visit | Admitting: Vascular Surgery

## 2016-04-19 ENCOUNTER — Encounter: Admission: RE | Payer: Self-pay | Source: Ambulatory Visit

## 2016-04-19 SURGERY — ARTERIOVENOUS (AV) FISTULA CREATION
Anesthesia: General | Laterality: Right

## 2016-05-01 LAB — CULTURE, BLOOD (ROUTINE X 2): CULTURE: NO GROWTH

## 2016-05-15 ENCOUNTER — Non-Acute Institutional Stay (SKILLED_NURSING_FACILITY): Payer: Medicare HMO | Admitting: Family

## 2016-05-15 ENCOUNTER — Encounter: Payer: Self-pay | Admitting: Family

## 2016-05-15 DIAGNOSIS — E1121 Type 2 diabetes mellitus with diabetic nephropathy: Secondary | ICD-10-CM | POA: Diagnosis not present

## 2016-05-15 DIAGNOSIS — I2510 Atherosclerotic heart disease of native coronary artery without angina pectoris: Secondary | ICD-10-CM

## 2016-05-15 DIAGNOSIS — I5032 Chronic diastolic (congestive) heart failure: Secondary | ICD-10-CM | POA: Diagnosis not present

## 2016-05-15 NOTE — Progress Notes (Signed)
Location:  [facility] Room Number: 1006-B Place of Service:  SNF (31) Provider:  Myda Detwiler FNP-C   Jarome Matin, MD  Patient Care Team: Jarome Matin, MD as PCP - General Laurier Nancy, MD as Consulting Physician (Cardiology) Mady Haagensen, MD as Consulting Physician (Internal Medicine)  Extended Emergency Contact Information Primary Emergency Contact: Dixon,Heather N Address: [address]          [place], [place] [address] [place] Mobile Phone: [phone] Relation: Daughter  Code Status:  Full Code  Goals of care: Advanced Directive information Advanced Directives 04/10/2016  Does Patient Have a Medical Advance Directive? No  Would patient like information on creating a medical advance directive? No - Patient declined     Chief Complaint  Patient presents with  . Medical Management of Chronic Issues    Routine Visit    HPI:  Pt is a 51 y.o. female seen today at [facility] for an acute visit for evaluation of swelling on right abdomen. She has a significant medical history of ESRD on dialysis, HTN, CAD, CHF, GERD, Type 2 DM among others. She is seen in her room today. She denies any acute issues this visit. she has had no recent weight changes, fall episodes or acute illness since prior visit. Facility staff reports no new concerns.   Past Medical History:  Diagnosis Date  . Anginal pain (HCC)   . Anxiety   . Bipolar disorder (HCC)   . CAD (coronary artery disease)   . CHF (congestive heart failure) (HCC)   . Chronic lower back pain   . Depression   . Endometriosis   . ESRD (end stage renal disease) on dialysis (HCC)    "[facility]; [address]; [place]; TTS" (01/19/2016)  . Gastroparesis   . GERD (gastroesophageal reflux disease)   . History of blood transfusion "several"   "my blood would get low; low RBC"  . History of hiatal hernia   . HLD (hyperlipidemia)   . Hypertension   .  Migraine    "monthly" (01/19/2016)  . Myocardial infarction 2017   "~ 3 wks ago" (01/19/2016)  . Renal disorder   . Renal insufficiency   . Seizures (HCC) 07/2015   "I've only had the 1; don't know what from" (01/19/2016)  . Type II diabetes mellitus (HCC)    Past Surgical History:  Procedure Laterality Date  . ABDOMINAL HYSTERECTOMY     "partial"  . BELOW KNEE LEG AMPUTATION Right 2010?  [name] CARDIAC CATHETERIZATION Right 07/10/2015   Procedure: Left Heart Cath and Coronary Angiography;  Surgeon: Laurier Nancy, MD;  Location: [facility] INVASIVE CV LAB;  Service: Cardiovascular;  Laterality: Right;  . CARDIAC CATHETERIZATION Right 09/14/2015   Procedure: Left Heart Cath and Coronary Angiography;  Surgeon: Laurier Nancy, MD;  Location: [facility] INVASIVE CV LAB;  Service: Cardiovascular;  Laterality: Right;  . CARDIAC CATHETERIZATION N/A 09/14/2015   Procedure: Coronary Stent Intervention;  Surgeon: Alwyn Pea, MD;  Location: [facility] INVASIVE CV LAB;  Service: Cardiovascular;  Laterality: N/A;  . CARDIAC CATHETERIZATION Right 11/20/2015   Procedure: Left Heart Cath and Coronary Angiography;  Surgeon: Laurier Nancy, MD;  Location: [facility] INVASIVE CV LAB;  Service: Cardiovascular;  Laterality: Right;  . CORONARY ANGIOPLASTY WITH STENT PLACEMENT  <2017   @ [facility]/notes 07/03/2013  . HYSTEROTOMY    . PERIPHERAL VASCULAR CATHETERIZATION N/A 08/30/2015   Procedure: Dialysis/Perma Catheter Insertion;  Surgeon: Annice Needy, MD;  Location: [facility] INVASIVE CV LAB;  Service: Cardiovascular;  Laterality: N/A;  . PERIPHERAL VASCULAR CATHETERIZATION N/A 01/01/2016   Procedure: Dialysis/Perma Catheter Insertion;  Surgeon: Annice Needy, MD;  Location: [facility] INVASIVE CV LAB;  Service: Cardiovascular;  Laterality: N/A;  . PERITONEAL CATHETER INSERTION  11/03/2013   [name] 11/03/2013  . PERITONEAL CATHETER REMOVAL  01/01/2016   "took the one from May out; put new PD cath in" (01/19/2016)  . PERITONEAL CATHETER REMOVAL   11/03/2013; 02/11/2014   [name] 11/03/2013; Removal of tunneled catheter/notes 02/11/2014  . SALPINGOOPHORECTOMY Right    [name] 07/03/2013  . TUBAL LIGATION      Allergies  Allergen Reactions  . Cephalosporins Anaphylaxis    Patient has tolerated meropenem.   [name] Penicillins Anaphylaxis and Other (See Comments)    Has patient had a PCN reaction causing immediate rash, facial/tongue/throat swelling, SOB or lightheadedness with hypotension: Yes Has patient had a PCN reaction causing severe rash involving mucus membranes or skin necrosis: No Has patient had a PCN reaction that required hospitalization No Has patient had a PCN reaction occurring within the last 10 years: No If all of the above answers are "NO", then may proceed with Cephalosporin use.  . Lamictal [Lamotrigine] Other (See Comments)    Reaction:  Hallucinations  . Phenergan [Promethazine Hcl] Nausea And Vomiting  . Pravastatin Other (See Comments)    Reaction:  Muscle pain   . Sulfa Antibiotics Other (See Comments)    Reaction:  Unknown     Allergies as of 05/15/2016      Reactions   Cephalosporins Anaphylaxis   Patient has tolerated meropenem.    Penicillins Anaphylaxis, Other (See Comments)   Has patient had a PCN reaction causing immediate rash, facial/tongue/throat swelling, SOB or lightheadedness with hypotension: Yes Has patient had a PCN reaction causing severe rash involving mucus membranes or skin necrosis: No Has patient had a PCN reaction that required hospitalization No Has patient had a PCN reaction occurring within the last 10 years: No If all of the above answers are "NO", then may proceed with Cephalosporin use.   Lamictal [lamotrigine] Other (See Comments)   Reaction:  Hallucinations   Phenergan [promethazine Hcl] Nausea And Vomiting   Pravastatin Other (See Comments)   Reaction:  Muscle pain    Sulfa Antibiotics Other (See Comments)   Reaction:  Unknown       Medication List       Accurate as of  05/15/16  6:22 PM. Always use your most recent med list.          amLODipine 10 MG tablet Commonly known as:  NORVASC Take 10 mg by mouth daily.   aspirin EC 81 MG tablet Take 81 mg by mouth daily.   buPROPion 200 MG 12 hr tablet Commonly known as:  WELLBUTRIN SR Take 200 mg by mouth daily.   calcium carbonate 500 MG chewable tablet Commonly known as:  TUMS - dosed in mg elemental calcium Chew 1 tablet (200 mg of elemental calcium total) by mouth 3 (three) times daily with meals.   carboxymethylcellulose 1 % ophthalmic solution Apply 1 drop to eye 3 (three) times daily.   clopidogrel 75 MG tablet Commonly known as:  PLAVIX Take 75 mg by mouth daily.   feeding supplement (PRO-STAT SUGAR FREE 64) Liqd Take 30 mLs by mouth 3 (three) times daily with meals.   fluticasone 50 MCG/ACT nasal spray Commonly known as:  FLONASE Place 1 spray  into both nostrils daily.   gabapentin 300 MG capsule Commonly known as:  NEURONTIN Take 300 mg by mouth 3 (three) times daily.   hydrALAZINE 25 MG tablet Commonly known as:  APRESOLINE Take 25 mg by mouth daily.   ICY HOT LIDOCAINE PLUS MENTHOL 4-1 % Ptch Generic drug:  Lidocaine-Menthol Apply 2 patches topically daily. Apply 2 patches to lumbar region QAM, remove after 12 hours   insulin aspart 100 UNIT/ML injection Commonly known as:  novoLOG Inject 10 Units into the skin 3 (three) times daily with meals.   insulin glargine 100 UNIT/ML injection Commonly known as:  LANTUS Inject 0.08 mLs (8 Units total) into the skin at bedtime.   isosorbide mononitrate 60 MG 24 hr tablet Commonly known as:  IMDUR Take 1.5 tablets (90 mg total) by mouth daily.   lactulose 10 GM/15ML solution Commonly known as:  CHRONULAC Take 10 g by mouth daily.   levETIRAcetam 500 MG tablet Commonly known as:  KEPPRA Take 1 tablet (500 mg total) by mouth every morning.   lisinopril 10 MG tablet Commonly known as:  PRINIVIL,ZESTRIL Take 1 tablet (10 mg  total) by mouth daily.   metoCLOPramide 10 MG tablet Commonly known as:  REGLAN Take 10 mg by mouth 4 (four) times daily.   metoprolol 50 MG tablet Commonly known as:  LOPRESSOR Take 1 tablet (50 mg total) by mouth 2 (two) times daily.   multivitamin Tabs tablet Take 1 tablet by mouth daily.   mupirocin cream 2 % Commonly known as:  BACTROBAN Apply 1 application topically 2 (two) times daily. 1 application to nose two times daily   nitroGLYCERIN 0.4 MG SL tablet Commonly known as:  NITROSTAT Place 0.4 mg under the tongue every 5 (five) minutes as needed for chest pain. Reported on 07/10/2015   omeprazole 20 MG capsule Commonly known as:  PRILOSEC Take 20 mg by mouth 2 (two) times daily before a meal.   ondansetron 4 MG disintegrating tablet Commonly known as:  ZOFRAN ODT Take 1 tablet (4 mg total) by mouth every 6 (six) hours as needed for nausea or vomiting.   oxyCODONE-acetaminophen 5-325 MG tablet Commonly known as:  PERCOCET/ROXICET Take one tablet by mouth every 6 hours as needed for moderate pain. Do not exceed 4gm of Tylenol in 24 hours   polyethylene glycol packet Commonly known as:  MIRALAX / GLYCOLAX Take 17 g by mouth daily.   risperiDONE 1 MG tablet Commonly known as:  RISPERDAL Take 1 mg by mouth 3 (three) times daily. Take tablets at bedtime   senna 8.6 MG Tabs tablet Commonly known as:  SENOKOT Take 2 tablets by mouth 2 (two) times daily.   sevelamer carbonate 800 MG tablet Commonly known as:  RENVELA Take 800 mg by mouth 4 (four) times daily. Take 3 times with meals and 1 with snacks.   torsemide 100 MG tablet Commonly known as:  DEMADEX Take 100 mg by mouth daily.   trihexyphenidyl 2 MG tablet Commonly known as:  ARTANE Take 1 mg by mouth daily.       Review of Systems  Constitutional: Negative for activity change, appetite change, chills, fatigue and fever.  HENT: Negative for congestion, rhinorrhea, sinus pressure, sneezing and sore  throat.   Eyes: Negative.   Respiratory: Negative for cough, shortness of breath and wheezing.   Cardiovascular: Negative for chest pain, palpitations and leg swelling.  Gastrointestinal: Negative for abdominal distention, abdominal pain, constipation, diarrhea, nausea and vomiting.  Endocrine: Negative.  Genitourinary: Negative for flank pain and urgency.       ESRD on dialysis   Musculoskeletal: Positive for gait problem.  Skin: Negative for color change, pallor and rash.  Neurological: Negative for dizziness, seizures, syncope, light-headedness and headaches.  Hematological: Does not bruise/bleed easily.  Psychiatric/Behavioral: Negative for agitation, confusion, hallucinations and sleep disturbance. The patient is not nervous/anxious.     Immunization History  Administered Date(s) Administered  . Influenza, Quadrivalent, Recombinant, Inj, Pf 01/25/2013  . Influenza, Seasonal, Injecte, Preservative Fre 01/02/2012  . Influenza-Unspecified 02/27/2016  . PPD Test 02/27/2016  . Pneumococcal Polysaccharide-23 07/23/2011  . Td 06/08/2007   Pertinent  Health Maintenance Due  Topic Date Due  . FOOT EXAM  10/03/1973  . OPHTHALMOLOGY EXAM  10/03/1973  . PAP SMEAR  10/03/1984  . MAMMOGRAM  10/03/2013  . COLONOSCOPY  10/03/2013  . HEMOGLOBIN A1C  03/14/2016  . INFLUENZA VACCINE  Completed      Vitals:   05/15/16 1511  BP: (!) 138/50  Pulse: (!) 59  Resp: 18  Temp: 97.7 F (36.5 C)  TempSrc: Oral  SpO2: 96%  Weight: 248 lb 14.4 oz (112.9 kg)  Height: 5\' 9"  (1.753 m)   Body mass index is 36.76 kg/m. Physical Exam  Constitutional: She is oriented to person, place, and time. She appears well-developed. No distress.  Obese   HENT:  Head: Normocephalic.  Mouth/Throat: Oropharynx is clear and moist. No oropharyngeal exudate.  Eyes: Conjunctivae and EOM are normal. Pupils are equal, round, and reactive to light. No scleral icterus.  Neck: Normal range of motion. No JVD  present. No thyromegaly present.  Cardiovascular: Normal rate, regular rhythm, normal heart sounds and intact distal pulses.  Exam reveals no gallop and no friction rub.   No murmur heard. Pulmonary/Chest: Effort normal and breath sounds normal. No respiratory distress. She has no wheezes. She has no rales.  Abdominal: Soft. Bowel sounds are normal. She exhibits no distension. There is no tenderness. There is no rebound and no guarding.  Right lower abdominal previous peritoneal site swelling and redness has improved. Site hard and nontender to touch. Sutures dry clean and intact.   Genitourinary:  Genitourinary Comments: On dialysis   Musculoskeletal: She exhibits no tenderness.  Right BKA.  Lymphadenopathy:    She has no cervical adenopathy.  Neurological: She is oriented to person, place, and time.  Skin: Skin is warm and dry. No rash noted. No erythema. No pallor.     Psychiatric: She has a normal mood and affect.    Labs reviewed:  Recent Labs  07/21/15 0427  11/19/15 1547  01/04/16 1055  01/20/16 0820  02/27/16 1025  03/16/16 0541 03/27/16 04/02/16 1513 04/10/16 1154  NA 128*  < >  --   < > 130*  < > 137  < >  --   < > 131* 136* 136 138  K 4.2  < >  --   < > 3.9  < > 3.6  < >  --   < > 3.8 4.8 3.3* 3.3*  CL 91*  < >  --   < > 91*  < > 99*  < >  --   < > 99*  --  96* 101  CO2 22  < >  --   < > 29  < > 31  < >  --   < > 28  --  30 28  GLUCOSE 157*  < >  --   < >  147*  < > 107*  < >  --   < > 96  --  160* 133*  BUN 62*  < >  --   < > 51*  < > 28*  < >  --   < > 31* 46* 21* 38*  CREATININE 9.89*  < >  --   < > 5.58*  < > 3.91*  < >  --   < > 4.06* 5.1* 3.21* 3.70*  CALCIUM 5.9*  < >  --   < > 10.5*  < > 9.2  < >  --   < > 8.9  --  8.8* 9.3  MG 1.9  --  1.9  --   --   --   --   --   --   --   --   --   --   --   PHOS  --   < >  --   < > 3.5  --  4.7*  --  4.9*  --   --   --   --   --   < > = values in this interval not displayed.  Recent Labs  12/25/15 1434   01/20/16 0820  02/25/16 1328 03/13/16 1612 03/27/16  AST 17  < >  --   < > 28 12* 10*  ALT 9*  --   --   < > 9* 8* 11  ALKPHOS 66  < >  --   < > 70 73 84  BILITOT 0.5  --   --   --  0.7 0.5  --   PROT 5.0*  --   --   --  5.6* 5.7*  --   ALBUMIN 2.2*  < > 1.9*  --  2.5* 2.4*  --   < > = values in this interval not displayed.  Recent Labs  03/03/16 2155  03/13/16 1612  03/16/16 0541  04/02/16 1513 04/03/16 04/10/16 1154  WBC 9.6  < > 6.2  < > 5.4  < > 7.1 6.8 7.5  NEUTROABS 6.9  --  4.4  --   --   --   --   --  5.5  HGB 12.1  < > 11.4*  < > 9.4*  < > 9.5* 8.5* 9.7*  HCT 38.6  < > 35.5  < > 28.4*  < > 28.5* 28* 30.7*  MCV 85.4  --  84.8  < > 84.9  --  83.1  --  85.7  PLT 364  < > 308  < > 248  < > 454* 387 383  < > = values in this interval not displayed. Lab Results  Component Value Date   TSH 8.570 (H) 11/19/2015   Lab Results  Component Value Date   HGBA1C 6.2 (H) 09/13/2015   Lab Results  Component Value Date   CHOL 215 (H) 11/19/2015   HDL 63 11/19/2015   LDLCALC 115 (H) 11/19/2015   TRIG 186 (H) 11/19/2015   CHOLHDL 3.4 11/19/2015   Assessment/Plan CHF Stable. No recent weight gain.negative exam.Continue on Imdur, lisinopril,hydralazine, torsemide and metoprolol. Continue to monitor weight.   CAD Chest pain free.continue on Plavix and ASA. Nitro as needed   Type 2 DM  Continue on Lantus and Novolog per SSI. Monitor Hgb A1C   Family/ staff Communication: Reviewed plan of care with patient and facility Nurse supervisor.   Labs/tests ordered: None

## 2016-05-18 ENCOUNTER — Emergency Department
Admission: EM | Admit: 2016-05-18 | Discharge: 2016-05-18 | Disposition: A | Discharge status: Home / Self Care | Payer: Medicare HMO | Attending: Emergency Medicine | Admitting: Emergency Medicine

## 2016-05-18 ENCOUNTER — Encounter: Payer: Self-pay | Admitting: Emergency Medicine

## 2016-05-18 ENCOUNTER — Emergency Department: Payer: Medicare HMO

## 2016-05-18 DIAGNOSIS — Z7982 Long term (current) use of aspirin: Secondary | ICD-10-CM | POA: Diagnosis not present

## 2016-05-18 DIAGNOSIS — I2510 Atherosclerotic heart disease of native coronary artery without angina pectoris: Secondary | ICD-10-CM | POA: Diagnosis not present

## 2016-05-18 DIAGNOSIS — J181 Lobar pneumonia, unspecified organism: Secondary | ICD-10-CM | POA: Diagnosis not present

## 2016-05-18 DIAGNOSIS — E1122 Type 2 diabetes mellitus with diabetic chronic kidney disease: Secondary | ICD-10-CM | POA: Diagnosis not present

## 2016-05-18 DIAGNOSIS — I509 Heart failure, unspecified: Secondary | ICD-10-CM | POA: Diagnosis not present

## 2016-05-18 DIAGNOSIS — J189 Pneumonia, unspecified organism: Secondary | ICD-10-CM

## 2016-05-18 DIAGNOSIS — I132 Hypertensive heart and chronic kidney disease with heart failure and with stage 5 chronic kidney disease, or end stage renal disease: Secondary | ICD-10-CM | POA: Insufficient documentation

## 2016-05-18 DIAGNOSIS — Z794 Long term (current) use of insulin: Secondary | ICD-10-CM | POA: Insufficient documentation

## 2016-05-18 DIAGNOSIS — R079 Chest pain, unspecified: Secondary | ICD-10-CM

## 2016-05-18 DIAGNOSIS — N186 End stage renal disease: Secondary | ICD-10-CM | POA: Diagnosis not present

## 2016-05-18 DIAGNOSIS — Z87891 Personal history of nicotine dependence: Secondary | ICD-10-CM | POA: Diagnosis not present

## 2016-05-18 DIAGNOSIS — Z992 Dependence on renal dialysis: Secondary | ICD-10-CM | POA: Diagnosis not present

## 2016-05-18 LAB — BASIC METABOLIC PANEL
Anion gap: 8 (ref 5–15)
BUN: 40 mg/dL — ABNORMAL HIGH (ref 6–20)
CALCIUM: 9.5 mg/dL (ref 8.9–10.3)
CO2: 29 mmol/L (ref 22–32)
CREATININE: 4.59 mg/dL — AB (ref 0.44–1.00)
Chloride: 93 mmol/L — ABNORMAL LOW (ref 101–111)
GFR calc Af Amer: 12 mL/min — ABNORMAL LOW (ref >60)
GFR calc non Af Amer: 10 mL/min — ABNORMAL LOW (ref >60)
GLUCOSE: 92 mg/dL (ref 65–99)
Potassium: 4.5 mmol/L (ref 3.5–5.1)
Sodium: 130 mmol/L — ABNORMAL LOW (ref 135–145)

## 2016-05-18 LAB — CBC
HCT: 33.4 % — ABNORMAL LOW (ref 35.0–47.0)
HEMOGLOBIN: 10.9 g/dL — AB (ref 12.0–16.0)
MCH: 28.8 pg (ref 26.0–34.0)
MCHC: 32.6 g/dL (ref 32.0–36.0)
MCV: 88.5 fL (ref 80.0–100.0)
PLATELETS: 342 10*3/uL (ref 150–440)
RBC: 3.78 MIL/uL — ABNORMAL LOW (ref 3.80–5.20)
RDW: 16.1 % — ABNORMAL HIGH (ref 11.5–14.5)
WBC: 8 10*3/uL (ref 3.6–11.0)

## 2016-05-18 LAB — TROPONIN I
TROPONIN I: 0.03 ng/mL — AB (ref <0.03)
Troponin I: 0.04 ng/mL (ref <0.03)

## 2016-05-18 MED ORDER — MORPHINE SULFATE (PF) 4 MG/ML IV SOLN
4.0000 mg | Freq: Once | INTRAVENOUS | Status: AC
  Administered 2016-05-18: 4 mg via INTRAVENOUS
  Filled 2016-05-18: qty 1

## 2016-05-18 MED ORDER — AZITHROMYCIN 250 MG PO TABS: ORAL_TABLET | ORAL | 0 refills | Status: DC

## 2016-05-18 MED ORDER — AZITHROMYCIN 500 MG PO TABS
500.0000 mg | ORAL_TABLET | Freq: Every day | ORAL | Status: DC
  Administered 2016-05-18: 500 mg via ORAL
  Filled 2016-05-18: qty 1

## 2016-05-18 MED ORDER — ONDANSETRON HCL 4 MG/2ML IJ SOLN
4.0000 mg | Freq: Once | INTRAMUSCULAR | Status: AC
  Administered 2016-05-18: 4 mg via INTRAVENOUS
  Filled 2016-05-18: qty 2

## 2016-05-18 NOTE — ED Notes (Signed)
Pt verbalized understanding of discharge instructions. NAD at this time. 

## 2016-05-18 NOTE — Discharge Instructions (Addendum)
Return to the emergency department for any fever, worsening chest pain, trouble breathing, shortness breath, or any other symptoms concerning to you.

## 2016-05-18 NOTE — ED Triage Notes (Signed)
Pt arrived to ED by EMS from dialysis. Pt was "just a few minutes into her treatment" when she suddenly felt short of breath and began to have chest pain. Pt given 4 aspirin and 1 nitro in route with EMS. After nitro pt's pain went from 10 down 6.

## 2016-05-18 NOTE — ED Provider Notes (Signed)
[facility] Emergency Department Provider Note ____________________________________________   I have reviewed the triage vital signs and the triage nursing note.  HISTORY  Chief Complaint Chest Pain   Historian Patient  HPI Kimberly Montgomery is a 51 y.o. female end-stage renal patient, came from dialysis with onset of central and left-sided chest pain partly through dialysis. Pain was initially severe and currently moderate after EMS treated patient prehospital with aspirin and nitroglycerin. Patient states her pain is now 6 out of 10. States that her last cardiac catheter was about 7 months ago. States that her pain today feels similar to when she needed a stent. She also has a history for recent weight retention, and she was supposed to have additional fluid pulled off today.  States that she has chronic shortness of breath, but worse over the past several days with increased fluid load. Denies coughing or sputum production or fever or flulike symptoms.    Past Medical History:  Diagnosis Date  . Anginal pain (HCC)   . Anxiety   . Bipolar disorder (HCC)   . CAD (coronary artery disease)   . CHF (congestive heart failure) (HCC)   . Chronic lower back pain   . Depression   . Endometriosis   . ESRD (end stage renal disease) on dialysis (HCC)    "[facility]; [address]; [place]; TTS" (01/19/2016)  . Gastroparesis   . GERD (gastroesophageal reflux disease)   . History of blood transfusion "several"   "my blood would get low; low RBC"  . History of hiatal hernia   . HLD (hyperlipidemia)   . Hypertension   . Migraine    "monthly" (01/19/2016)  . Myocardial infarction 2017   "~ 3 wks ago" (01/19/2016)  . Renal disorder   . Renal insufficiency   . Seizures (HCC) 07/2015   "I've only had the 1; don't know what from" (01/19/2016)  . Type II diabetes mellitus [facility])     Patient Active Problem List   Diagnosis Date Noted  . Complication from renal dialysis  device 04/08/2016  . Colitis 03/13/2016  . Chronic diastolic congestive heart failure (HCC) 01/22/2016  . Pressure injury of skin 01/20/2016  . Bacteremia 12/27/2015  . Essential hypertension 11/21/2015  . Anemia of chronic disease 11/21/2015  . Acute respiratory failure with hypoxia (HCC) 11/21/2015  . ESRD on dialysis (HCC) 11/21/2015  . 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 NSTEMI (non-ST elevated myocardial infarction) (HCC) 11/20/2015  . Chest pain, rule out acute myocardial infarction 11/18/2015  . Bipolar I disorder, most recent episode depressed (HCC)   . Altered mental status 08/24/2015  . Ileus (HCC)   . Bipolar I disorder (HCC) 07/25/2015  . Seizures (HCC) 07/25/2015  . Peritonitis (HCC) 07/16/2015  . Unstable angina (HCC) 07/09/2015  . ESRD on peritoneal dialysis (HCC) 07/09/2015  . Accelerated hypertension 07/09/2015  . Type 2 diabetes mellitus (HCC) 07/09/2015  . CAD (coronary artery disease) 07/09/2015  . HLD (hyperlipidemia) 07/09/2015  . GERD (gastroesophageal reflux disease) 07/09/2015    Past Surgical History:  Procedure Laterality Date  . ABDOMINAL HYSTERECTOMY     "partial"  . BELOW KNEE LEG AMPUTATION Right 2010?  [name] CARDIAC CATHETERIZATION Right 07/10/2015   Procedure: Left Heart Cath and Coronary Angiography;  Surgeon: Laurier Nancy, MD;  Location: [facility] INVASIVE CV LAB;  Service: Cardiovascular;  Laterality: Right;  . CARDIAC CATHETERIZATION Right 09/14/2015   Procedure: Left Heart Cath and Coronary Angiography;  Surgeon: Laurier Nancy, MD;  Location: [facility] INVASIVE  CV LAB;  Service: Cardiovascular;  Laterality: Right;  . CARDIAC CATHETERIZATION N/A 09/14/2015   Procedure: Coronary Stent Intervention;  Surgeon: Alwyn Pea, MD;  Location: [facility] INVASIVE CV LAB;  Service: Cardiovascular;  Laterality: N/A;  . CARDIAC CATHETERIZATION Right 11/20/2015   Procedure: Left Heart Cath and Coronary Angiography;  Surgeon: Laurier Nancy, MD;  Location: [facility] INVASIVE CV LAB;   Service: Cardiovascular;  Laterality: Right;  . CORONARY ANGIOPLASTY WITH STENT PLACEMENT  <2017   @ [facility]/notes 07/03/2013  . HYSTEROTOMY    . PERIPHERAL VASCULAR CATHETERIZATION N/A 08/30/2015   Procedure: Dialysis/Perma Catheter Insertion;  Surgeon: Annice Needy, MD;  Location: [facility] INVASIVE CV LAB;  Service: Cardiovascular;  Laterality: N/A;  . PERIPHERAL VASCULAR CATHETERIZATION N/A 01/01/2016   Procedure: Dialysis/Perma Catheter Insertion;  Surgeon: Annice Needy, MD;  Location: [facility] INVASIVE CV LAB;  Service: Cardiovascular;  Laterality: N/A;  . PERITONEAL CATHETER INSERTION  11/03/2013   [name] 11/03/2013  . PERITONEAL CATHETER REMOVAL  01/01/2016   "took the one from May out; put new PD cath in" (01/19/2016)  . PERITONEAL CATHETER REMOVAL  11/03/2013; 02/11/2014   [name] 11/03/2013; Removal of tunneled catheter/notes 02/11/2014  . SALPINGOOPHORECTOMY Right    [name] 07/03/2013  . TUBAL LIGATION      Prior to Admission medications   Medication Sig Start Date End Date Taking? Authorizing Provider  Amino Acids-Protein Hydrolys (FEEDING SUPPLEMENT, PRO-STAT SUGAR FREE 64,) LIQD Take 30 mLs by mouth 3 (three) times daily with meals.   Yes Historical Provider, MD  amLODipine (NORVASC) 10 MG tablet Take 10 mg by mouth daily.    Yes Historical Provider, MD  aspirin EC 81 MG tablet Take 81 mg by mouth daily.   Yes Historical Provider, MD  buPROPion (WELLBUTRIN SR) 150 MG 12 hr tablet Take 150 mg by mouth daily.    Yes Historical Provider, MD  calcium carbonate (TUMS - DOSED IN MG ELEMENTAL CALCIUM) 500 MG chewable tablet Chew 1 tablet (200 mg of elemental calcium total) by mouth 3 (three) times daily with meals. 08/02/15  Yes Adrian Saran, MD  carvedilol (COREG) 6.25 MG tablet Take 6.25 mg by mouth 2 (two) times daily with a meal.   Yes Historical Provider, MD  clopidogrel (PLAVIX) 75 MG tablet Take 75 mg by mouth daily.   Yes Historical Provider, MD  fluticasone (FLONASE) 50 MCG/ACT nasal spray Place 1  spray into both nostrils daily.    Yes Historical Provider, MD  gabapentin (NEURONTIN) 300 MG capsule Take 300 mg by mouth 3 (three) times daily.   Yes Historical Provider, MD  hydrALAZINE (APRESOLINE) 25 MG tablet Take 25 mg by mouth daily.    Yes Historical Provider, MD  insulin aspart (NOVOLOG) 100 UNIT/ML injection Inject 10 Units into the skin 3 (three) times daily with meals.    Yes Historical Provider, MD  insulin glargine (LANTUS) 100 UNIT/ML injection Inject 0.08 mLs (8 Units total) into the skin at bedtime. 08/30/15  Yes Srikar Sudini, MD  isosorbide mononitrate (IMDUR) 60 MG 24 hr tablet Take 1.5 tablets (90 mg total) by mouth daily. 01/20/16  Yes Vassie Loll, MD  lactulose [facility]) 10 GM/15ML solution Take 10 g by mouth daily.    Yes Historical Provider, MD  levETIRAcetam (KEPPRA) 500 MG tablet Take 1 tablet (500 mg total) by mouth every morning. 08/02/15  Yes Sital Mody, MD  lisinopril (PRINIVIL,ZESTRIL) 10 MG tablet Take 1 tablet (10 mg total) by mouth daily. 01/21/16  Yes Vassie Loll, MD  metoCLOPramide (REGLAN) 10 MG tablet Take 10 mg by mouth 4 (four) times daily.   Yes Historical Provider, MD  metoprolol (LOPRESSOR) 50 MG tablet Take 1 tablet (50 mg total) by mouth 2 (two) times daily. 09/15/15  Yes Srikar Sudini, MD  nitroGLYCERIN (NITROSTAT) 0.4 MG SL tablet Place 0.4 mg under the tongue every 5 (five) minutes as needed for chest pain. Reported on 07/10/2015   Yes Historical Provider, MD  omeprazole (PRILOSEC) 20 MG capsule Take 20 mg by mouth 2 (two) times daily before a meal.    Yes Historical Provider, MD  ondansetron (ZOFRAN ODT) 4 MG disintegrating tablet Take 1 tablet (4 mg total) by mouth every 6 (six) hours as needed for nausea or vomiting. 07/16/15  Yes Sharyn Creamer, MD  oxyCODONE-acetaminophen (PERCOCET/ROXICET) 5-325 MG tablet Take one tablet by mouth every 6 hours as needed for moderate pain. Do not exceed 4gm of Tylenol in 24 hours 03/16/16  Yes Ramonita Lab, MD   polyethylene glycol (MIRALAX / GLYCOLAX) packet Take 17 g by mouth daily. 08/02/15  Yes Adrian Saran, MD  risperiDONE (RISPERDAL) 1 MG tablet Take 1 mg by mouth 3 (three) times daily. Take tablets at bedtime   Yes Historical Provider, MD  senna (SENOKOT) 8.6 MG TABS tablet Take 2 tablets by mouth 2 (two) times daily.    Yes Historical Provider, MD  sevelamer carbonate (RENVELA) 800 MG tablet Take 800 mg by mouth 4 (four) times daily. Take 3 times with meals and 1 with snacks.   Yes Historical Provider, MD  torsemide (DEMADEX) 100 MG tablet Take 100 mg by mouth daily.    Yes Historical Provider, MD  azithromycin (ZITHROMAX) 250 MG tablet One tab daily for 4 more days 05/18/16   Governor Rooks, MD  multivitamin (RENA-VIT) TABS tablet Take 1 tablet by mouth daily.    Historical Provider, MD  trihexyphenidyl (ARTANE) 2 MG tablet Take 1 mg by mouth daily.    Historical Provider, MD    Allergies  Allergen Reactions  . Cephalosporins Anaphylaxis    Patient has tolerated meropenem.   [name] Penicillins Anaphylaxis and Other (See Comments)    Has patient had a PCN reaction causing immediate rash, facial/tongue/throat swelling, SOB or lightheadedness with hypotension: Yes Has patient had a PCN reaction causing severe rash involving mucus membranes or skin necrosis: No Has patient had a PCN reaction that required hospitalization No Has patient had a PCN reaction occurring within the last 10 years: No If all of the above answers are "NO", then may proceed with Cephalosporin use.  . Lamictal [Lamotrigine] Other (See Comments)    Reaction:  Hallucinations  . Phenergan [Promethazine Hcl] Nausea And Vomiting  . Pravastatin Other (See Comments)    Reaction:  Muscle pain   . Sulfa Antibiotics Other (See Comments)    Reaction:  Unknown     Family History  Problem Relation Age of Onset  . CAD    . Diabetes    . Bipolar disorder    . Cervical cancer Mother     Social History Social History  Substance Use  Topics  . Smoking status: Former Smoker    Packs/day: 0.75    Types: Cigarettes    Quit date: 07/01/2015  . Smokeless tobacco: Never Used  . Alcohol use 0.0 oz/week     Comment: 01/19/2016 "might have a couple drinks/year"    Review of Systems  Constitutional: Negative for fever. Eyes: Negative for visual changes. ENT: Negative for sore throat. Cardiovascular: Positive  for chest pain. Respiratory: Positive for shortness of breath. Gastrointestinal: Negative for abdominal pain, vomiting and diarrhea. Genitourinary: Negative for dysuria. Musculoskeletal: Negative for back pain. Skin: Negative for rash. Neurological: Negative for headache. 10 point Review of Systems otherwise negative ____________________________________________   PHYSICAL EXAM:  VITAL SIGNS: ED Triage Vitals  Enc Vitals Group     BP 05/18/16 0813 (!) 161/60     Pulse Rate 05/18/16 0813 (!) 48     Resp 05/18/16 0813 13     Temp 05/18/16 0813 97.6 F (36.4 C)     Temp src --      SpO2 05/18/16 0813 100 %     Weight 05/18/16 0814 243 lb 4.8 oz (110.4 kg)     Height 05/18/16 0814 5\' 9"  (1.753 m)     Head Circumference --      Peak Flow --      Pain Score 05/18/16 0816 6     Pain Loc --      Pain Edu? --      Excl. in GC? --      Constitutional: Alert and oriented. Well appearing and in no distress. HEENT   Head: Normocephalic and atraumatic.      Eyes: Conjunctivae are normal. PERRL. Normal extraocular movements.      Ears:         Nose: No congestion/rhinnorhea.  Wearing home O2 nasal cannula 2 L.   Mouth/Throat: Mucous membranes are moist.   Neck: No stridor. Cardiovascular/Chest: Normal rate, regular rhythm.  No murmurs, rubs, or gallops. Respiratory: Normal respiratory effort without tachypnea nor retractions. Breath sounds are clear and equal bilaterally. No wheezes/rales/rhonchi. Gastrointestinal: Soft. No distention, no guarding, no rebound. Nontender.  Morbidly obese   Genitourinary/rectal:Deferred Musculoskeletal: Nontender with normal range of motion in all extremities. Right leg amputations.  2+ edema lle. Neurologic:  Normal speech and language. No gross or focal neurologic deficits are appreciated. Skin:  Skin is warm, dry and intact. No rash noted. Psychiatric: Mood and affect are normal. Speech and behavior are normal. Patient exhibits appropriate insight and judgment.   ____________________________________________  LABS (pertinent positives/negatives)  Labs Reviewed  BASIC METABOLIC PANEL - Abnormal; Notable for the following:       Result Value   Sodium 130 (*)    Chloride 93 (*)    BUN 40 (*)    Creatinine, Ser 4.59 (*)    GFR calc non Af Amer 10 (*)    GFR calc Af Amer 12 (*)    All other components within normal limits  CBC - Abnormal; Notable for the following:    RBC 3.78 (*)    Hemoglobin 10.9 (*)    HCT 33.4 (*)    RDW 16.1 (*)    All other components within normal limits  TROPONIN I - Abnormal; Notable for the following:    Troponin I 0.03 (*)    All other components within normal limits  TROPONIN I - Abnormal; Notable for the following:    Troponin I 0.04 (*)    All other components within normal limits  CULTURE, BLOOD (ROUTINE X 2)  CULTURE, BLOOD (ROUTINE X 2)    ____________________________________________    EKG I, Governor Rooks, MD, the attending physician have personally viewed and interpreted all ECGs.  50 bpm. Sinus rhythm, although some indication of possible underlying flutter wave. Narrow QRS. Normal axis. Normal ST and T-wave ____________________________________________  RADIOLOGY All Xrays were viewed by me. Imaging interpreted by Radiologist.  Chest x-ray  two-view:  IMPRESSION: Posterior left base airspace consolidation consistent with pneumonia. Lungs elsewhere clear. No pneumothorax. Heart borderline enlarged with aortic atherosclerosis.  Followup PA and lateral chest radiographs recommended  in 3-4 weeks following trial of antibiotic therapy to ensure resolution and exclude underlying malignancy. __________________________________________  PROCEDURES  Procedure(s) performed: None  Critical Care performed: None  ____________________________________________   ED COURSE / ASSESSMENT AND PLAN  Pertinent labs & imaging results that were available during my care of the patient were reviewed by me and considered in my medical decision making (see chart for details).   Patient experienced chest pain onset during dialysis. She does a history of coronary disease and 2 stents. I reviewed her catheter report from 11/20/15 where it's noted that she has LAD stent patent, as well as right coronary stent patent.  Initial troponin reassuring, similar with prior 0.030 0.04 troponin.  Chest x-ray shows likely infiltrate left lung, although patient is not really reporting new shortness breath or hypoxia, I will treat her with azithromycin first dose here in 4 more days Rx for 20 or pneumonia. No indication for need for hospitalization for this.  Repeat troponin was 0.04, no significant increase, no return of pain. I think she is okay for discharge home. Patient will call cardiologist office on Monday to make a close follow-up appointment.  Due to amputee, dialysis patient, and on home O2, patient will require nonemergency ambulance transport home.   CONSULTATIONS:  Attempted phone call to Dr. Welton Flakes for close follow up, did not hear back, patient to call office in the morning.     Patient / Family / Caregiver informed of clinical course, medical decision-making process, and agree with plan.   I discussed return precautions, follow-up instructions, and discharge instructions with patient and/or family.   ___________________________________________   FINAL CLINICAL IMPRESSION(S) / ED DIAGNOSES   Final diagnoses:  Nonspecific chest pain  Community acquired pneumonia of left lower  lobe of lung (HCC)              Note: This dictation was prepared with Dragon dictation. Any transcriptional errors that result from this process are unintentional    Governor Rooks, MD 05/18/16 1451

## 2016-05-19 LAB — BLOOD CULTURE ID PANEL (REFLEXED)
Acinetobacter baumannii: NOT DETECTED
CANDIDA TROPICALIS: NOT DETECTED
Candida albicans: NOT DETECTED
Candida glabrata: NOT DETECTED
Candida krusei: NOT DETECTED
Candida parapsilosis: NOT DETECTED
ENTEROBACTER CLOACAE COMPLEX: NOT DETECTED
ENTEROBACTERIACEAE SPECIES: NOT DETECTED
ENTEROCOCCUS SPECIES: NOT DETECTED
Escherichia coli: NOT DETECTED
HAEMOPHILUS INFLUENZAE: NOT DETECTED
KLEBSIELLA PNEUMONIAE: NOT DETECTED
Klebsiella oxytoca: NOT DETECTED
Listeria monocytogenes: NOT DETECTED
METHICILLIN RESISTANCE: DETECTED — AB
NEISSERIA MENINGITIDIS: NOT DETECTED
PROTEUS SPECIES: NOT DETECTED
Pseudomonas aeruginosa: NOT DETECTED
STAPHYLOCOCCUS AUREUS BCID: NOT DETECTED
STAPHYLOCOCCUS SPECIES: DETECTED — AB
STREPTOCOCCUS AGALACTIAE: NOT DETECTED
STREPTOCOCCUS SPECIES: NOT DETECTED
Serratia marcescens: NOT DETECTED
Streptococcus pneumoniae: NOT DETECTED
Streptococcus pyogenes: NOT DETECTED

## 2016-05-19 NOTE — Progress Notes (Signed)
PHARMACY - PHYSICIAN COMMUNICATION CRITICAL VALUE ALERT - BLOOD CULTURE IDENTIFICATION (BCID)  Results for orders placed or performed during the hospital encounter of 05/18/16  Blood Culture ID Panel (Reflexed) (Collected: 05/18/2016 10:02 AM)  Result Value Ref Range   Enterococcus species NOT DETECTED NOT DETECTED   Listeria monocytogenes NOT DETECTED NOT DETECTED   Staphylococcus species DETECTED (A) NOT DETECTED   Staphylococcus aureus NOT DETECTED NOT DETECTED   Methicillin resistance DETECTED (A) NOT DETECTED   Streptococcus species NOT DETECTED NOT DETECTED   Streptococcus agalactiae NOT DETECTED NOT DETECTED   Streptococcus pneumoniae NOT DETECTED NOT DETECTED   Streptococcus pyogenes NOT DETECTED NOT DETECTED   Acinetobacter baumannii NOT DETECTED NOT DETECTED   Enterobacteriaceae species NOT DETECTED NOT DETECTED   Enterobacter cloacae complex NOT DETECTED NOT DETECTED   Escherichia coli NOT DETECTED NOT DETECTED   Klebsiella oxytoca NOT DETECTED NOT DETECTED   Klebsiella pneumoniae NOT DETECTED NOT DETECTED   Proteus species NOT DETECTED NOT DETECTED   Serratia marcescens NOT DETECTED NOT DETECTED   Haemophilus influenzae NOT DETECTED NOT DETECTED   Neisseria meningitidis NOT DETECTED NOT DETECTED   Pseudomonas aeruginosa NOT DETECTED NOT DETECTED   Candida albicans NOT DETECTED NOT DETECTED   Candida glabrata NOT DETECTED NOT DETECTED   Candida krusei NOT DETECTED NOT DETECTED   Candida parapsilosis NOT DETECTED NOT DETECTED   Candida tropicalis NOT DETECTED NOT DETECTED    Name of physician (or Provider) Contacted: Dr. Don Perking  Changes to prescribed antibiotics required: MD will follow-up with patient to see how she is doing. Could be a possible contaminant with patient not presenting with WBC or fever. Will continue to trend blood culture.   Delsa Bern, PharmD 05/19/2016  9:18 AM

## 2016-05-19 NOTE — ED Provider Notes (Signed)
Was contacted by Tresa Endo from pharmacy to let me know that one set of patient's blood culture is growing gram-positive cocci, methicillin-resistant. Pharmacy does believe this is a contaminate. I contacted the patient to see how she was doing. I just spoke with her via phone. Patient reports that she feels back to her normal self. Patient denies fever, chills, rigors, malaise, weakness, CP, SOB, nausea, vomiting, dysuria. I informed patient of the results of this blood culture and asked her to present to the emergency room if she develops any of these symptoms above. Patient agrees to come back if any develop however at this time she reports that she feels great.   Nita Sickle, MD 05/19/16 [phone]

## 2016-05-22 LAB — CULTURE, BLOOD (ROUTINE X 2)

## 2016-05-29 ENCOUNTER — Encounter (INDEPENDENT_AMBULATORY_CARE_PROVIDER_SITE_OTHER): Payer: Self-pay

## 2016-05-29 ENCOUNTER — Other Ambulatory Visit (INDEPENDENT_AMBULATORY_CARE_PROVIDER_SITE_OTHER): Payer: Self-pay

## 2016-05-29 DIAGNOSIS — N186 End stage renal disease: Secondary | ICD-10-CM

## 2016-05-29 DIAGNOSIS — Z992 Dependence on renal dialysis: Principal | ICD-10-CM

## 2016-05-31 ENCOUNTER — Emergency Department (HOSPITAL_COMMUNITY): Payer: Medicare HMO

## 2016-05-31 ENCOUNTER — Inpatient Hospital Stay (HOSPITAL_COMMUNITY): Payer: Medicare HMO

## 2016-05-31 ENCOUNTER — Encounter (HOSPITAL_COMMUNITY): Payer: Self-pay | Admitting: Emergency Medicine

## 2016-05-31 ENCOUNTER — Other Ambulatory Visit: Payer: Self-pay

## 2016-05-31 ENCOUNTER — Inpatient Hospital Stay (HOSPITAL_COMMUNITY)
Admission: EM | Admit: 2016-05-31 | Discharge: 2016-06-11 | DRG: 100 | Disposition: A | Discharge status: Skilled Nursing Facility | Payer: Medicare HMO | Attending: Internal Medicine | Admitting: Internal Medicine

## 2016-05-31 DIAGNOSIS — N183 Chronic kidney disease, stage 3 (moderate): Secondary | ICD-10-CM | POA: Diagnosis present

## 2016-05-31 DIAGNOSIS — R569 Unspecified convulsions: Secondary | ICD-10-CM

## 2016-05-31 DIAGNOSIS — J9601 Acute respiratory failure with hypoxia: Secondary | ICD-10-CM

## 2016-05-31 DIAGNOSIS — E1143 Type 2 diabetes mellitus with diabetic autonomic (poly)neuropathy: Secondary | ICD-10-CM | POA: Diagnosis present

## 2016-05-31 DIAGNOSIS — F319 Bipolar disorder, unspecified: Secondary | ICD-10-CM | POA: Diagnosis present

## 2016-05-31 DIAGNOSIS — G9340 Encephalopathy, unspecified: Secondary | ICD-10-CM

## 2016-05-31 DIAGNOSIS — R4182 Altered mental status, unspecified: Secondary | ICD-10-CM | POA: Diagnosis present

## 2016-05-31 DIAGNOSIS — I5032 Chronic diastolic (congestive) heart failure: Secondary | ICD-10-CM | POA: Diagnosis present

## 2016-05-31 DIAGNOSIS — E1122 Type 2 diabetes mellitus with diabetic chronic kidney disease: Secondary | ICD-10-CM | POA: Diagnosis present

## 2016-05-31 DIAGNOSIS — E871 Hypo-osmolality and hyponatremia: Secondary | ICD-10-CM | POA: Diagnosis present

## 2016-05-31 DIAGNOSIS — F419 Anxiety disorder, unspecified: Secondary | ICD-10-CM | POA: Diagnosis present

## 2016-05-31 DIAGNOSIS — N186 End stage renal disease: Secondary | ICD-10-CM | POA: Diagnosis present

## 2016-05-31 DIAGNOSIS — Z79899 Other long term (current) drug therapy: Secondary | ICD-10-CM

## 2016-05-31 DIAGNOSIS — J9600 Acute respiratory failure, unspecified whether with hypoxia or hypercapnia: Secondary | ICD-10-CM | POA: Diagnosis not present

## 2016-05-31 DIAGNOSIS — I252 Old myocardial infarction: Secondary | ICD-10-CM | POA: Diagnosis not present

## 2016-05-31 DIAGNOSIS — Z89511 Acquired absence of right leg below knee: Secondary | ICD-10-CM

## 2016-05-31 DIAGNOSIS — Z9119 Patient's noncompliance with other medical treatment and regimen: Secondary | ICD-10-CM

## 2016-05-31 DIAGNOSIS — E785 Hyperlipidemia, unspecified: Secondary | ICD-10-CM | POA: Diagnosis present

## 2016-05-31 DIAGNOSIS — Z79891 Long term (current) use of opiate analgesic: Secondary | ICD-10-CM | POA: Diagnosis not present

## 2016-05-31 DIAGNOSIS — I132 Hypertensive heart and chronic kidney disease with heart failure and with stage 5 chronic kidney disease, or end stage renal disease: Secondary | ICD-10-CM | POA: Diagnosis present

## 2016-05-31 DIAGNOSIS — G9341 Metabolic encephalopathy: Secondary | ICD-10-CM | POA: Diagnosis present

## 2016-05-31 DIAGNOSIS — K219 Gastro-esophageal reflux disease without esophagitis: Secondary | ICD-10-CM | POA: Diagnosis present

## 2016-05-31 DIAGNOSIS — Z7902 Long term (current) use of antithrombotics/antiplatelets: Secondary | ICD-10-CM

## 2016-05-31 DIAGNOSIS — D631 Anemia in chronic kidney disease: Secondary | ICD-10-CM | POA: Diagnosis present

## 2016-05-31 DIAGNOSIS — I161 Hypertensive emergency: Secondary | ICD-10-CM | POA: Diagnosis present

## 2016-05-31 DIAGNOSIS — I679 Cerebrovascular disease, unspecified: Secondary | ICD-10-CM | POA: Diagnosis not present

## 2016-05-31 DIAGNOSIS — R1313 Dysphagia, pharyngeal phase: Secondary | ICD-10-CM | POA: Diagnosis present

## 2016-05-31 DIAGNOSIS — Z992 Dependence on renal dialysis: Secondary | ICD-10-CM

## 2016-05-31 DIAGNOSIS — Z955 Presence of coronary angioplasty implant and graft: Secondary | ICD-10-CM

## 2016-05-31 DIAGNOSIS — K3184 Gastroparesis: Secondary | ICD-10-CM | POA: Diagnosis present

## 2016-05-31 DIAGNOSIS — Z888 Allergy status to other drugs, medicaments and biological substances status: Secondary | ICD-10-CM

## 2016-05-31 DIAGNOSIS — I2510 Atherosclerotic heart disease of native coronary artery without angina pectoris: Secondary | ICD-10-CM | POA: Diagnosis present

## 2016-05-31 DIAGNOSIS — R4020 Unspecified coma: Secondary | ICD-10-CM

## 2016-05-31 DIAGNOSIS — G40901 Epilepsy, unspecified, not intractable, with status epilepticus: Secondary | ICD-10-CM | POA: Diagnosis present

## 2016-05-31 DIAGNOSIS — I639 Cerebral infarction, unspecified: Secondary | ICD-10-CM

## 2016-05-31 DIAGNOSIS — B957 Other staphylococcus as the cause of diseases classified elsewhere: Secondary | ICD-10-CM | POA: Diagnosis present

## 2016-05-31 DIAGNOSIS — Z794 Long term (current) use of insulin: Secondary | ICD-10-CM

## 2016-05-31 DIAGNOSIS — R7881 Bacteremia: Secondary | ICD-10-CM | POA: Diagnosis present

## 2016-05-31 DIAGNOSIS — R251 Tremor, unspecified: Secondary | ICD-10-CM

## 2016-05-31 DIAGNOSIS — Z880 Allergy status to penicillin: Secondary | ICD-10-CM

## 2016-05-31 DIAGNOSIS — Z9181 History of falling: Secondary | ICD-10-CM

## 2016-05-31 DIAGNOSIS — Z87891 Personal history of nicotine dependence: Secondary | ICD-10-CM

## 2016-05-31 LAB — PROTEIN, CSF: Total  Protein, CSF: 43 mg/dL (ref 15–45)

## 2016-05-31 LAB — BLOOD GAS, ARTERIAL
ACID-BASE EXCESS: 1.9 mmol/L (ref 0.0–2.0)
BICARBONATE: 26.8 mmol/L (ref 20.0–28.0)
DRAWN BY: 441371
FIO2: 100
LHR: 20 {breaths}/min
MECHVT: 530 mL
O2 SAT: 99.5 %
PATIENT TEMPERATURE: 98.6
PCO2 ART: 48.7 mmHg — AB (ref 32.0–48.0)
PEEP/CPAP: 5 cmH2O
PH ART: 7.359 (ref 7.350–7.450)
PO2 ART: 437 mmHg — AB (ref 83.0–108.0)

## 2016-05-31 LAB — I-STAT CHEM 8, ED
BUN: 36 mg/dL — AB (ref 6–20)
CHLORIDE: 96 mmol/L — AB (ref 101–111)
CREATININE: 4 mg/dL — AB (ref 0.44–1.00)
Calcium, Ion: 1.19 mmol/L (ref 1.15–1.40)
Glucose, Bld: 127 mg/dL — ABNORMAL HIGH (ref 65–99)
HEMATOCRIT: 37 % (ref 36.0–46.0)
HEMOGLOBIN: 12.6 g/dL (ref 12.0–15.0)
POTASSIUM: 3.9 mmol/L (ref 3.5–5.1)
Sodium: 132 mmol/L — ABNORMAL LOW (ref 135–145)
TCO2: 28 mmol/L (ref 0–100)

## 2016-05-31 LAB — COMPREHENSIVE METABOLIC PANEL
ALBUMIN: 3.1 g/dL — AB (ref 3.5–5.0)
ALT: 11 U/L — ABNORMAL LOW (ref 14–54)
AST: 15 U/L (ref 15–41)
Alkaline Phosphatase: 66 U/L (ref 38–126)
Anion gap: 12 (ref 5–15)
BUN: 33 mg/dL — AB (ref 6–20)
CHLORIDE: 96 mmol/L — AB (ref 101–111)
CO2: 24 mmol/L (ref 22–32)
Calcium: 9.7 mg/dL (ref 8.9–10.3)
Creatinine, Ser: 4.01 mg/dL — ABNORMAL HIGH (ref 0.44–1.00)
GFR calc Af Amer: 14 mL/min — ABNORMAL LOW (ref >60)
GFR calc non Af Amer: 12 mL/min — ABNORMAL LOW (ref >60)
GLUCOSE: 124 mg/dL — AB (ref 65–99)
POTASSIUM: 4.1 mmol/L (ref 3.5–5.1)
Sodium: 132 mmol/L — ABNORMAL LOW (ref 135–145)
Total Bilirubin: 0.5 mg/dL (ref 0.3–1.2)
Total Protein: 6.2 g/dL — ABNORMAL LOW (ref 6.5–8.1)

## 2016-05-31 LAB — GLUCOSE, CAPILLARY
GLUCOSE-CAPILLARY: 107 mg/dL — AB (ref 65–99)
Glucose-Capillary: 101 mg/dL — ABNORMAL HIGH (ref 65–99)

## 2016-05-31 LAB — MRSA PCR SCREENING: MRSA BY PCR: NEGATIVE

## 2016-05-31 LAB — DIFFERENTIAL
BASOS ABS: 0 10*3/uL (ref 0.0–0.1)
BASOS PCT: 1 %
EOS ABS: 0.2 10*3/uL (ref 0.0–0.7)
Eosinophils Relative: 3 %
Lymphocytes Relative: 24 %
Lymphs Abs: 1.5 10*3/uL (ref 0.7–4.0)
Monocytes Absolute: 0.4 10*3/uL (ref 0.1–1.0)
Monocytes Relative: 6 %
NEUTROS PCT: 66 %
Neutro Abs: 4.2 10*3/uL (ref 1.7–7.7)

## 2016-05-31 LAB — CBC
HCT: 37.6 % (ref 36.0–46.0)
HEMOGLOBIN: 12 g/dL (ref 12.0–15.0)
MCH: 28.3 pg (ref 26.0–34.0)
MCHC: 31.9 g/dL (ref 30.0–36.0)
MCV: 88.7 fL (ref 78.0–100.0)
Platelets: 195 10*3/uL (ref 150–400)
RBC: 4.24 MIL/uL (ref 3.87–5.11)
RDW: 14.4 % (ref 11.5–15.5)
WBC: 6.4 10*3/uL (ref 4.0–10.5)

## 2016-05-31 LAB — PROTIME-INR
INR: 0.99
Prothrombin Time: 13.1 seconds (ref 11.4–15.2)

## 2016-05-31 LAB — CBG MONITORING, ED: GLUCOSE-CAPILLARY: 125 mg/dL — AB (ref 65–99)

## 2016-05-31 LAB — I-STAT TROPONIN, ED: TROPONIN I, POC: 0.02 ng/mL (ref 0.00–0.08)

## 2016-05-31 LAB — CSF CELL COUNT WITH DIFFERENTIAL
Eosinophils, CSF: NONE SEEN % (ref 0–1)
RBC COUNT CSF: 555 /mm3 — AB
Tube #: 1
WBC CSF: 6 /mm3 — AB (ref 0–5)

## 2016-05-31 LAB — PROCALCITONIN: Procalcitonin: 1.09 ng/mL

## 2016-05-31 LAB — VALPROIC ACID LEVEL

## 2016-05-31 LAB — APTT: APTT: 31 s (ref 24–36)

## 2016-05-31 LAB — GLUCOSE, CSF: Glucose, CSF: 69 mg/dL (ref 40–70)

## 2016-05-31 MED ORDER — FENTANYL BOLUS VIA INFUSION
50.0000 ug | INTRAVENOUS | Status: DC | PRN
  Filled 2016-05-31: qty 50

## 2016-05-31 MED ORDER — FENTANYL CITRATE (PF) 100 MCG/2ML IJ SOLN
50.0000 ug | Freq: Once | INTRAMUSCULAR | Status: AC
  Administered 2016-05-31: 50 ug via INTRAVENOUS

## 2016-05-31 MED ORDER — LABETALOL HCL 5 MG/ML IV SOLN
INTRAVENOUS | Status: AC
  Filled 2016-05-31: qty 4

## 2016-05-31 MED ORDER — SODIUM CHLORIDE 0.9 % IV SOLN
2500.0000 mg | Freq: Once | INTRAVENOUS | Status: AC
  Administered 2016-05-31: 2500 mg via INTRAVENOUS
  Filled 2016-05-31: qty 2500

## 2016-05-31 MED ORDER — SODIUM CHLORIDE 0.9 % IV SOLN: 250.0000 mL | INTRAVENOUS | Status: DC | PRN

## 2016-05-31 MED ORDER — INSULIN ASPART 100 UNIT/ML ~~LOC~~ SOLN
0.0000 [IU] | SUBCUTANEOUS | Status: DC
  Administered 2016-06-01 – 2016-06-04 (×11): 3 [IU] via SUBCUTANEOUS

## 2016-05-31 MED ORDER — LIDOCAINE HCL (PF) 1 % IJ SOLN
5.0000 mL | Freq: Once | INTRAMUSCULAR | Status: AC
  Administered 2016-05-31: 5 mL via INTRADERMAL

## 2016-05-31 MED ORDER — CLEVIDIPINE BUTYRATE 0.5 MG/ML IV EMUL
0.0000 mg/h | INTRAVENOUS | Status: DC
  Administered 2016-05-31: 1 mg/h via INTRAVENOUS
  Administered 2016-06-01: 4 mg/h via INTRAVENOUS
  Filled 2016-05-31 (×3): qty 50

## 2016-05-31 MED ORDER — ROCURONIUM BROMIDE 50 MG/5ML IV SOLN
INTRAVENOUS | Status: DC | PRN
  Administered 2016-05-31: 100 mg via INTRAVENOUS

## 2016-05-31 MED ORDER — SODIUM CHLORIDE 0.9 % IV SOLN
1.0000 g | Freq: Once | INTRAVENOUS | Status: AC
  Administered 2016-05-31: 1 g via INTRAVENOUS
  Filled 2016-05-31: qty 1

## 2016-05-31 MED ORDER — FENTANYL 2500MCG IN NS 250ML (10MCG/ML) PREMIX INFUSION
25.0000 ug/h | INTRAVENOUS | Status: DC
  Administered 2016-05-31: 25 ug/h via INTRAVENOUS
  Filled 2016-05-31: qty 250

## 2016-05-31 MED ORDER — MIDAZOLAM HCL 2 MG/2ML IJ SOLN
2.0000 mg | INTRAMUSCULAR | Status: DC | PRN
  Administered 2016-05-31 – 2016-06-02 (×4): 2 mg via INTRAVENOUS
  Filled 2016-05-31 (×4): qty 2

## 2016-05-31 MED ORDER — LABETALOL HCL 5 MG/ML IV SOLN
20.0000 mg | Freq: Once | INTRAVENOUS | Status: AC
  Administered 2016-05-31: 20 mg via INTRAVENOUS

## 2016-05-31 MED ORDER — MIDAZOLAM HCL 2 MG/2ML IJ SOLN
2.0000 mg | INTRAMUSCULAR | Status: AC | PRN
  Administered 2016-05-31 (×3): 2 mg via INTRAVENOUS
  Filled 2016-05-31 (×3): qty 2

## 2016-05-31 MED ORDER — IOPAMIDOL (ISOVUE-370) INJECTION 76%
INTRAVENOUS | Status: AC
  Administered 2016-05-31: 50 mL
  Filled 2016-05-31: qty 100

## 2016-05-31 MED ORDER — PANTOPRAZOLE SODIUM 40 MG IV SOLR
40.0000 mg | Freq: Every day | INTRAVENOUS | Status: DC
  Administered 2016-05-31: 40 mg via INTRAVENOUS
  Filled 2016-05-31: qty 40

## 2016-05-31 MED ORDER — LORAZEPAM 2 MG/ML IJ SOLN
INTRAMUSCULAR | Status: AC
  Administered 2016-05-31: 1 mg
  Filled 2016-05-31: qty 1

## 2016-05-31 MED ORDER — ORAL CARE MOUTH RINSE
15.0000 mL | OROMUCOSAL | Status: DC
  Administered 2016-05-31 – 2016-06-03 (×24): 15 mL via OROMUCOSAL

## 2016-05-31 MED ORDER — VALPROATE SODIUM 500 MG/5ML IV SOLN
2000.0000 mg | Freq: Once | INTRAVENOUS | Status: AC
  Administered 2016-05-31: 2000 mg via INTRAVENOUS
  Filled 2016-05-31 (×2): qty 20

## 2016-05-31 MED ORDER — SODIUM CHLORIDE 0.9 % IV SOLN
500.0000 mg | Freq: Two times a day (BID) | INTRAVENOUS | Status: DC
  Administered 2016-05-31 – 2016-06-01 (×2): 500 mg via INTRAVENOUS
  Filled 2016-05-31 (×2): qty 5

## 2016-05-31 MED ORDER — HEPARIN SODIUM (PORCINE) 5000 UNIT/ML IJ SOLN
5000.0000 [IU] | Freq: Three times a day (TID) | INTRAMUSCULAR | Status: DC
  Administered 2016-05-31 – 2016-06-11 (×32): 5000 [IU] via SUBCUTANEOUS
  Filled 2016-05-31 (×34): qty 1

## 2016-05-31 MED ORDER — ETOMIDATE 2 MG/ML IV SOLN
INTRAVENOUS | Status: DC | PRN
  Administered 2016-05-31: 20 mg via INTRAVENOUS

## 2016-05-31 MED ORDER — LORAZEPAM 2 MG/ML IJ SOLN
INTRAMUSCULAR | Status: AC
  Administered 2016-05-31: 2 mg
  Filled 2016-05-31: qty 1

## 2016-05-31 MED ORDER — CHLORHEXIDINE GLUCONATE 0.12% ORAL RINSE (MEDLINE KIT)
15.0000 mL | Freq: Two times a day (BID) | OROMUCOSAL | Status: DC
  Administered 2016-06-01 – 2016-06-03 (×5): 15 mL via OROMUCOSAL

## 2016-05-31 NOTE — H&P (Signed)
PULMONARY / CRITICAL CARE MEDICINE   Name: Kimberly Montgomery MRN: [id number] DOB: Aug 20, 1965    ADMISSION DATE:  05/31/2016 CONSULTATION DATE:  05/31/2016  REFERRING MD:  EDP - [place]  CHIEF COMPLAINT:  AMS and seizure  HISTORY OF PRESENT ILLNESS:   51 year old female with PMH of seizure (on keppra), ESRD-HD, DM and CHF who presents to the ED with seizure and AMS.  Patient was brought to the ED where she had another seizure, given ativan and seizure activity was under control.  Patient recently had a recent positive blood culture (coag negative staff) that was felt to be a contaminant but with stiff neck and fever concern was for meningitis and LP was performed that was reportedly clean.  Patient was intubated for inability to protect her airway and PCCM was called to admit.  Neuro seeing patient.  Reportedly prior to patient loosing her mental status again and SBP became 240.  Patient was then intubated.  PAST MEDICAL HISTORY :  She  has a past medical history of Anginal pain (HCC); Anxiety; Bipolar disorder (HCC); CAD (coronary artery disease); CHF (congestive heart failure) (HCC); Chronic lower back pain; Depression; Endometriosis; ESRD (end stage renal disease) on dialysis (HCC); Gastroparesis; GERD (gastroesophageal reflux disease); History of blood transfusion ("several"); History of hiatal hernia; HLD (hyperlipidemia); Hypertension; Migraine; Myocardial infarction (2017); Renal disorder; Renal insufficiency; Seizures (HCC) (07/2015); and Type II diabetes mellitus (HCC).  PAST SURGICAL HISTORY: She  has a past surgical history that includes Hysterotomy; Below knee leg amputation (Right, 2010?); Cardiac catheterization (N/A, 08/30/2015); Cardiac catheterization (N/A, 01/01/2016); Abdominal hysterectomy; Tubal ligation; Peritoneal catheter removal (01/01/2016); Salpingoophorectomy (Right); Cardiac catheterization (Right, 07/10/2015); Cardiac catheterization (Right, 09/14/2015); Cardiac catheterization  (N/A, 09/14/2015); Cardiac catheterization (Right, 11/20/2015); Coronary angioplasty with stent (<2017); Peritoneal catheter insertion (11/03/2013); and Peritoneal catheter removal (11/03/2013; 02/11/2014).  Allergies  Allergen Reactions  . Cephalosporins Anaphylaxis    Patient has tolerated meropenem.   [name] Penicillins Anaphylaxis and Other (See Comments)    Has patient had a PCN reaction causing immediate rash, facial/tongue/throat swelling, SOB or lightheadedness with hypotension: Yes Has patient had a PCN reaction causing severe rash involving mucus membranes or skin necrosis: No Has patient had a PCN reaction that required hospitalization No Has patient had a PCN reaction occurring within the last 10 years: No If all of the above answers are "NO", then may proceed with Cephalosporin use.  . Lamictal [Lamotrigine] Other (See Comments)    Reaction:  Hallucinations  . Phenergan [Promethazine Hcl] Nausea And Vomiting  . Pravastatin Other (See Comments)    Reaction:  Muscle pain   . Sulfa Antibiotics Other (See Comments)    Reaction:  Unknown     No current facility-administered medications on file prior to encounter.    Current Outpatient Prescriptions on File Prior to Encounter  Medication Sig  . Amino Acids-Protein Hydrolys (FEEDING SUPPLEMENT, PRO-STAT SUGAR FREE 64,) LIQD Take 30 mLs by mouth 3 (three) times daily with meals.  [name] amLODipine (NORVASC) 10 MG tablet Take 10 mg by mouth daily.   [name] buPROPion (WELLBUTRIN SR) 200 MG 12 hr tablet Take 200 mg by mouth daily.  . calcium carbonate (TUMS - DOSED IN MG ELEMENTAL CALCIUM) 500 MG chewable tablet Chew 1 tablet (200 mg of elemental calcium total) by mouth 3 (three) times daily with meals.  . carboxymethylcellulose (REFRESH) 1 % ophthalmic solution Place 1 drop into both eyes 3 (three) times daily.  . clopidogrel (PLAVIX) 75 MG tablet Take 75 mg  by mouth daily.  . fluticasone (FLONASE) 50 MCG/ACT nasal spray Place 1 spray into both  nostrils daily.   [name] gabapentin (NEURONTIN) 300 MG capsule Take 300 mg by mouth 3 (three) times daily.  . hydrALAZINE (APRESOLINE) 25 MG tablet Take 25 mg by mouth daily.   . insulin aspart (NOVOLOG) 100 UNIT/ML injection Inject 10 Units into the skin 3 (three) times daily with meals.   . insulin glargine (LANTUS) 100 UNIT/ML injection Inject 0.08 mLs (8 Units total) into the skin at bedtime.  . isosorbide mononitrate (IMDUR) 60 MG 24 hr tablet Take 1.5 tablets (90 mg total) by mouth daily.  [name] lactulose (CHRONULAC) 10 GM/15ML solution Take 10 g by mouth daily.   [name] levETIRAcetam (KEPPRA) 500 MG tablet Take 1 tablet (500 mg total) by mouth every morning.  . Lidocaine-Menthol 4-1 % PTCH Apply 1 patch topically at bedtime. Apply lumbar region. Remove after 12 hours.  [name] lisinopril (PRINIVIL,ZESTRIL) 10 MG tablet Take 1 tablet (10 mg total) by mouth daily.  . metoCLOPramide (REGLAN) 10 MG tablet Take 10 mg by mouth 4 (four) times daily.  . metoprolol (LOPRESSOR) 50 MG tablet Take 1 tablet (50 mg total) by mouth 2 (two) times daily.  . multivitamin (RENA-VIT) TABS tablet Take 1 tablet by mouth daily.  . mupirocin cream (BACTROBAN) 2 % Apply 1 application topically 2 (two) times daily. Apply into nose.  . nitroGLYCERIN (NITROSTAT) 0.4 MG SL tablet Place 0.4 mg under the tongue every 5 (five) minutes as needed for chest pain. Reported on 07/10/2015  . omeprazole (PRILOSEC) 20 MG capsule Take 20 mg by mouth 2 (two) times daily before a meal.   . ondansetron (ZOFRAN ODT) 4 MG disintegrating tablet Take 1 tablet (4 mg total) by mouth every 6 (six) hours as needed for nausea or vomiting.  [name] oxyCODONE-acetaminophen (PERCOCET/ROXICET) 5-325 MG tablet Take one tablet by mouth every 6 hours as needed for moderate pain. Do not exceed 4gm of Tylenol in 24 hours (Patient taking differently: Take 1 tablet by mouth every 6 (six) hours as needed for moderate pain. Do not exceed 4gm of Tylenol in 24 hours)  . polyethylene  glycol (MIRALAX / GLYCOLAX) packet Take 17 g by mouth daily.  . risperiDONE (RISPERDAL) 1 MG tablet Take 1-2 mg by mouth See admin instructions. Take 1mg  in the AM, 1mg  in the evening, and 2mg  at bedtime  . senna (SENOKOT) 8.6 MG TABS tablet Take 2 tablets by mouth 2 (two) times daily.   . sevelamer carbonate (RENVELA) 800 MG tablet Take 800 mg by mouth See admin instructions. Take 1 tablet four times a day. (with each meal and with snack.)  . torsemide (DEMADEX) 100 MG tablet Take 100 mg by mouth daily.   . trihexyphenidyl (ARTANE) 2 MG tablet Take 1 mg by mouth daily at 12 noon.     FAMILY HISTORY:  Her @FAMSTP (<SUBSCRIPT> error)@  SOCIAL HISTORY: She  reports that she quit smoking about 11 months ago. Her smoking use included Cigarettes. She smoked 0.75 packs per day. She has never used smokeless tobacco. She reports that she drinks alcohol. She reports that she does not use drugs.  REVIEW OF SYSTEMS:   Unable to obtain, sedated, AMS and intubated.  SUBJECTIVE:  Sedated and intubated  VITAL SIGNS: BP 165/68   Pulse (!) 56   Temp (!) 96.4 F (35.8 C) (Rectal)   Resp 11   Ht 5\' 9"  (1.753 m)   Wt 109.4 kg (241 lb 2.9  oz)   LMP 03/16/2015   SpO2 98%   BMI 35.62 kg/m   HEMODYNAMICS:    VENTILATOR SETTINGS:    INTAKE / OUTPUT: No intake/output data recorded.  PHYSICAL EXAMINATION: General:  Chronically ill appearing female, recently given rocuronium so unable to assess. Neuro:  Sedated, paralyzed and intubated, unable to examine. HEENT:  Alpha/AT, pupils unresponsive and no EOM. Cardiovascular:  RRR, Nl S1/S2, -M/R/G Lungs:  CTA bilaterally  Abdomen:  Soft, NT, ND and +BS. Musculoskeletal:  -edema and -tenderness.  R BKA Skin:  Intact  LABS:  BMET  Recent Labs Lab 05/31/16 1030 05/31/16 1102  NA 132* 132*  K 4.1 3.9  CL 96* 96*  CO2 24  --   BUN 33* 36*  CREATININE 4.01* 4.00*  GLUCOSE 124* 127*    Electrolytes  Recent Labs Lab 05/31/16 1030   CALCIUM 9.7    CBC  Recent Labs Lab 05/31/16 1030 05/31/16 1102  WBC 6.4  --   HGB 12.0 12.6  HCT 37.6 37.0  PLT 195  --     Coag's  Recent Labs Lab 05/31/16 1030  APTT 31  INR 0.99    Sepsis Markers No results for input(s): LATICACIDVEN, PROCALCITON, O2SATVEN in the last 168 hours.  ABG No results for input(s): PHART, PCO2ART, PO2ART in the last 168 hours.  Liver Enzymes  Recent Labs Lab 05/31/16 1030  AST 15  ALT 11*  ALKPHOS 66  BILITOT 0.5  ALBUMIN 3.1*    Cardiac Enzymes No results for input(s): TROPONINI, PROBNP in the last 168 hours.  Glucose  Recent Labs Lab 05/31/16 1056  GLUCAP 125*    Imaging Ct Angio Head W Or Wo Contrast  Result Date: 05/31/2016 CLINICAL DATA:  51 year old female code stroke. Right side weakness. Initial encounter. EXAM: CT ANGIOGRAPHY HEAD AND NECK TECHNIQUE: Multidetector CT imaging of the head and neck was performed using the standard protocol during bolus administration of intravenous contrast. Multiplanar CT image reconstructions and MIPs were obtained to evaluate the vascular anatomy. Carotid stenosis measurements (when applicable) are obtained utilizing NASCET criteria, using the distal internal carotid diameter as the denominator. CONTRAST:  50 mL Isovue 370 COMPARISON:  Noncontrast head CT 1102 hours today. FINDINGS: CTA NECK Skeleton: No acute osseous abnormality identified. Mild for age spine degeneration. Upper chest: Negative lung apices. No superior mediastinal lymphadenopathy. There is a left internal jugular approach dual lumen dialysis type catheter in place. The visible SVC is patent. Other neck: Subcentimeter left thyroid lobe and isthmus nodules which do not meet size criteria for ultrasound follow-up. Negative larynx, pharynx, parapharyngeal spaces, retropharyngeal space, sublingual space, submandibular glands and parotid glands. No lymphadenopathy. Aortic arch: 3 vessel arch configuration. Mild arch and  proximal great vessels soft and calcified atherosclerotic plaque. Right carotid system: No brachiocephalic artery or right CCA origin stenosis despite some plaque. Mild circumferential soft plaque in the right CCA. Mostly calcified plaque at the right carotid bifurcation which is mild and results in less than 50 % stenosis with respect to the distal vessel. Somewhat non dominant appearing right ICA. Left carotid system: No left CCA origin stenosis despite soft and calcified plaque. Minimal soft and calcified plaque elsewhere in the left CCA. At the left carotid bifurcation there is combination soft and calcified plaque resulting in a short segment of high-grade stenosis estimated at 70 % stenosis with respect to the distal vessel (series 402, images 124n 125). Calcified plaque continues into the left ICA bulb but there is no additional cervical  left ICA stenosis. Vertebral arteries: Calcified and soft plaque at the right subclavian artery origin with no hemodynamically significant stenosis. Normal right vertebral artery origin. Normal cervical right vertebral artery. No proximal left subclavian artery stenosis despite soft and calcified plaque. There is soft plaque at the left vertebral artery origin with mild or at most moderate stenosis (series 403, image 138). Mild left V2 segment calcified plaque without stenosis. The left vertebral artery is mildly non dominant. CTA HEAD Posterior circulation: Right V4 segment calcified plaque with mild distal right vertebral artery stenosis. Patent vertebrobasilar junction. Combined soft and calcified left V4 segment plaque with similar mild stenosis. Normal left vertebrobasilar junction. Both a ICAs appear dominant. No basilar stenosis. Normal SCA and PCA origins. Diminutive posterior communicating arteries. Bilateral PCA branches are within normal limits. Anterior circulation: Both ICA siphons are patent. There is moderate to severe bilateral siphon calcified plaque worse on  the right. On the left side there is moderate cavernous left ICA stenosis (series 404, images 129 in 04/1928. On the right there is moderate to severe stenosis at both the proximal and distal cavernous segments (series 402, image 239). Normal ophthalmic artery origins. Carotid termini are patent. The left A1 segment is dominant and the right A1 is diminutive or absent. Anterior communicating artery and bilateral ACA branches are within normal limits. Right MCA M1 segment, right MCA bifurcation, and right MCA branches are within normal limits. The left MCA M1 segment is patent and bifurcates early. There is a patent left anterior temporal artery branch on series 404, image 133. The left MCA bifurcation then is patent. No left MCA branch occlusion or significant stenosis is identified. Venous sinuses: Patent. Anatomic variants: Mildly dominant right vertebral artery. Mildly dominant left ICA, with dominant left and diminutive or absent right ACA A1 segments. Review of the MIP images confirms the above findings IMPRESSION: 1. Negative for emergent large vessel occlusion. This was preliminarily discussed with Dr. Ritta Slot via telephone at 1141 hours. 2. 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 3. There is also extensive ICA siphon calcified plaque with moderate left and moderate to severe right ICA siphon stenosis. 4. Mildly dominant right vertebral artery. Mild bilateral V4 segment vertebral artery stenosis, with mild to moderate stenosis at the origin of the left vertebral artery. 5. Left IJ approach dual lumen dialysis type catheter in place. Electronically Signed   By: Odessa Fleming M.D.   On: 05/31/2016 11:56   Ct Head Wo Contrast  Result Date: 05/31/2016 CLINICAL DATA:  51 year old female with right side weakness. Code stroke. Initial encounter. EXAM: CT HEAD WITHOUT CONTRAST TECHNIQUE: Contiguous axial images were obtained from the base of the  skull through the vertex without intravenous contrast. COMPARISON:  Head CT 02/25/2016 FINDINGS: Brain: Gray-white matter differentiation appears stable and within normal limits throughout the brain. No cortically based acute infarct identified. No acute intracranial hemorrhage identified. No midline shift, mass effect, or evidence of intracranial mass lesion. No ventriculomegaly. Vascular: Calcified atherosclerosis at the skull base. No suspicious intracranial vascular hyperdensity. Skull: No acute osseous abnormality identified. Sinuses/Orbits: Mild mucosal thickening in the posterior left maxillary sinus. Other Visualized paranasal sinuses and mastoids are stable and well pneumatized. Other: Orbit and scalp soft tissues appear stable and negative. ASPECTS score = 10 [place] Stroke Program Early CT Score Normal score = 10 IMPRESSION: 1. Stable and normal Normal noncontrast CT appearance of the brain. 2. ASPECTS 10. 3. The above was relayed via  text pager to Dr. Ritta Slot on 05/31/2016 at 11:09 . Electronically Signed   By: Odessa Fleming M.D.   On: 05/31/2016 11:09   Ct Angio Neck W Or Wo Contrast  Result Date: 05/31/2016 CLINICAL DATA:  51 year old female code stroke. Right side weakness. Initial encounter. EXAM: CT ANGIOGRAPHY HEAD AND NECK TECHNIQUE: Multidetector CT imaging of the head and neck was performed using the standard protocol during bolus administration of intravenous contrast. Multiplanar CT image reconstructions and MIPs were obtained to evaluate the vascular anatomy. Carotid stenosis measurements (when applicable) are obtained utilizing NASCET criteria, using the distal internal carotid diameter as the denominator. CONTRAST:  50 mL Isovue 370 COMPARISON:  Noncontrast head CT 1102 hours today. FINDINGS: CTA NECK Skeleton: No acute osseous abnormality identified. Mild for age spine degeneration. Upper chest: Negative lung apices. No superior mediastinal lymphadenopathy. There is a left internal  jugular approach dual lumen dialysis type catheter in place. The visible SVC is patent. Other neck: Subcentimeter left thyroid lobe and isthmus nodules which do not meet size criteria for ultrasound follow-up. Negative larynx, pharynx, parapharyngeal spaces, retropharyngeal space, sublingual space, submandibular glands and parotid glands. No lymphadenopathy. Aortic arch: 3 vessel arch configuration. Mild arch and proximal great vessels soft and calcified atherosclerotic plaque. Right carotid system: No brachiocephalic artery or right CCA origin stenosis despite some plaque. Mild circumferential soft plaque in the right CCA. Mostly calcified plaque at the right carotid bifurcation which is mild and results in less than 50 % stenosis with respect to the distal vessel. Somewhat non dominant appearing right ICA. Left carotid system: No left CCA origin stenosis despite soft and calcified plaque. Minimal soft and calcified plaque elsewhere in the left CCA. At the left carotid bifurcation there is combination soft and calcified plaque resulting in a short segment of high-grade stenosis estimated at 70 % stenosis with respect to the distal vessel (series 402, images 124n 125). Calcified plaque continues into the left ICA bulb but there is no additional cervical left ICA stenosis. Vertebral arteries: Calcified and soft plaque at the right subclavian artery origin with no hemodynamically significant stenosis. Normal right vertebral artery origin. Normal cervical right vertebral artery. No proximal left subclavian artery stenosis despite soft and calcified plaque. There is soft plaque at the left vertebral artery origin with mild or at most moderate stenosis (series 403, image 138). Mild left V2 segment calcified plaque without stenosis. The left vertebral artery is mildly non dominant. CTA HEAD Posterior circulation: Right V4 segment calcified plaque with mild distal right vertebral artery stenosis. Patent vertebrobasilar  junction. Combined soft and calcified left V4 segment plaque with similar mild stenosis. Normal left vertebrobasilar junction. Both a ICAs appear dominant. No basilar stenosis. Normal SCA and PCA origins. Diminutive posterior communicating arteries. Bilateral PCA branches are within normal limits. Anterior circulation: Both ICA siphons are patent. There is moderate to severe bilateral siphon calcified plaque worse on the right. On the left side there is moderate cavernous left ICA stenosis (series 404, images 129 in 04/1928. On the right there is moderate to severe stenosis at both the proximal and distal cavernous segments (series 402, image 239). Normal ophthalmic artery origins. Carotid termini are patent. The left A1 segment is dominant and the right A1 is diminutive or absent. Anterior communicating artery and bilateral ACA branches are within normal limits. Right MCA M1 segment, right MCA bifurcation, and right MCA branches are within normal limits. The left MCA M1 segment is patent and bifurcates early. There is a  patent left anterior temporal artery branch on series 404, image 133. The left MCA bifurcation then is patent. No left MCA branch occlusion or significant stenosis is identified. Venous sinuses: Patent. Anatomic variants: Mildly dominant right vertebral artery. Mildly dominant left ICA, with dominant left and diminutive or absent right ACA A1 segments. Review of the MIP images confirms the above findings IMPRESSION: 1. Negative for emergent large vessel occlusion. This was preliminarily discussed with Dr. Ritta Slot via telephone at 1141 hours. 2. 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 3. There is also extensive ICA siphon calcified plaque with moderate left and moderate to severe right ICA siphon stenosis. 4. Mildly dominant right vertebral artery. Mild bilateral V4 segment vertebral artery stenosis, with mild to moderate  stenosis at the origin of the left vertebral artery. 5. Left IJ approach dual lumen dialysis type catheter in place. Electronically Signed   By: Odessa Fleming M.D.   On: 05/31/2016 11:56   Dg Fluoro Guide Lumbar Puncture  Result Date: 05/31/2016 CLINICAL DATA:  51 year old female with altered mental status, right side weakness. Code stroke workup thus far today is negative for emergent large vessel occlusion or CT evidence of cortically based infarct. Blood cultures are now positive for gram positive cocci. Diagnostic lumbar puncture requested. Initial encounter. EXAM: DIAGNOSTIC LUMBAR PUNCTURE UNDER FLUOROSCOPIC GUIDANCE FLUOROSCOPY TIME:  Fluoroscopy Time:  0 minutes 6 seconds Radiation Exposure Index (if provided by the fluoroscopic device): Number of Acquired Spot Images: 0 PROCEDURE: Informed consent was obtained from the patient's daughter prior to the procedure, including potential complications of headache, allergy, and pain. A "time-out" was performed. With the patient prone, the lower back was prepped with Betadine. 1% Lidocaine was used for local anesthesia. Lumbar puncture was performed at the L3-L4 level using left sub laminar technique and a 3.5 inch x 20 gauge needle with return of clear CSF with an opening pressure of 18 cm of water. 16 mL of CSF were obtained for laboratory studies. The patient tolerated the procedure well and there were no apparent complications. IMPRESSION: Diagnostic lumbar puncture at L3-L4. Clear CSF with opening pressure of 18 cm of water. 16 mL of CSF obtained for laboratory studies. Electronically Signed   By: Odessa Fleming M.D.   On: 05/31/2016 13:50     STUDIES:  Head CT 3/2>>>  CULTURES: Blood 3/2>>> Urine 3/2>>> Sputum 3/2>>>  ANTIBIOTICS: None  SIGNIFICANT EVENTS:  3/2>>> intubated for inability to protect her airway  LINES/TUBES: ETT 3/2>>>  DISCUSSION: 51 year old female with seizure history on keppra who was found altered in the SNF and was transferred  to [facility] for further evaluation.  Patient became acutely hypertensive and AMS and was intubated.  Not meningitis given LP results.  No evidence of acute infection anywhere.  Neurology following.  ASSESSMENT / PLAN:  PULMONARY A: VDRF due to inability to protect her airway. P:   - Full vent support - ABG - Adjust vent for ABG - ABG and CXR in AM.  CARDIOVASCULAR A:  Hypertensive and bradycardic ?cushing's triad P:  - Cleviprix drip for SBP 180 - Tele monitoring - Hold home beta blockers for now  RENAL A:   ESRD-HD P:   - BMET in AM - KVO IVF - Renal consult - Replace electrolytes as indicated  GASTROINTESTINAL A:   No active issues P:   - TF in AM - Protonix  HEMATOLOGIC A:   No active issues P:  -  CBC in AM - Transfuse per ICU protocol  INFECTIOUS A:   No infectious foci noted.  Abx given in ED. P:   - PCT - Hold abx for now - Pan cultures  ENDOCRINE A:   DM   P:   - CBGs - ISS  NEUROLOGIC A:   AMS concern for meningitis ruled out, now concern for ICH with sudden rise in BP vs seizure>benzos>hypercarbia>AMS>VDRF. P:   RASS goal: 0 - Fentanyl drip\ - Versed PRN - Neurology following - STAT head CT to r/o bleed.   FAMILY  - Updates: No family bedside to update.  - Inter-disciplinary family meet or Palliative Care meeting due by:  day 7  The patient is critically ill with multiple organ systems failure and requires high complexity decision making for assessment and support, frequent evaluation and titration of therapies, application of advanced monitoring technologies and extensive interpretation of multiple databases.   Critical Care Time devoted to patient care services described in this note is  45  Minutes. This time reflects time of care of this signee Dr Koren Bound. This critical care time does not reflect procedure time, or teaching time or supervisory time of PA/NP/Med student/Med Resident etc but could involve care discussion  time.  Alyson Reedy, M.D. [facility] Pulmonary/Critical Care Medicine. Pager: [phone]. After hours pager: [phone].  05/31/2016, 3:55 PM

## 2016-05-31 NOTE — ED Notes (Signed)
Pt to radiology to have LP completed.

## 2016-05-31 NOTE — Progress Notes (Signed)
Pharmacy Antibiotic Note  Kimberly Montgomery is a 51 y.o. female presented to ED as Code Stroke that was cancelled, possible seizure, admitted on 05/31/2016 with r/o meningitis.  Pharmacy has been consulted for vancomycin/meropenem dosing. Anaphylactic allergies reported to PCN/Cephalosporins but per chart, patient has tolerated meropenem here in the past. WBC WNL. ESRD on HD TTS outpatient.  Plan: Vancomycin 2500mg  IV x1 Meropenem 1g IV x1 Monitor clinical progress, c/s, renal function, abx plan/LOT Vancomycin trough as indicated F/u HD schedule/tolerance for antibiotic maintenance doses   Height: 5\' 9"  (175.3 cm) Weight: 241 lb 2.9 oz (109.4 kg) IBW/kg (Calculated) : 66.2  No data recorded.   Recent Labs Lab 05/31/16 1030 05/31/16 1102  WBC 6.4  --   CREATININE  --  4.00*    Estimated Creatinine Clearance: 21.7 mL/min (by C-G formula based on SCr of 4 mg/dL (H)).    Allergies  Allergen Reactions  . Cephalosporins Anaphylaxis    Patient has tolerated meropenem.   [name] Penicillins Anaphylaxis and Other (See Comments)    Has patient had a PCN reaction causing immediate rash, facial/tongue/throat swelling, SOB or lightheadedness with hypotension: Yes Has patient had a PCN reaction causing severe rash involving mucus membranes or skin necrosis: No Has patient had a PCN reaction that required hospitalization No Has patient had a PCN reaction occurring within the last 10 years: No If all of the above answers are "NO", then may proceed with Cephalosporin use.  . Lamictal [Lamotrigine] Other (See Comments)    Reaction:  Hallucinations  . Phenergan [Promethazine Hcl] Nausea And Vomiting  . Pravastatin Other (See Comments)    Reaction:  Muscle pain   . Sulfa Antibiotics Other (See Comments)    Reaction:  Unknown     Babs Bertin, PharmD, BCPS Clinical Pharmacist 05/31/2016 11:53 AM

## 2016-05-31 NOTE — Consult Note (Signed)
Reason for Consult: To manage dialysis and dialysis related needs Referring Physician: CCM  Kimberly Montgomery is an 51 y.o. female with past medical history significant for bipolar disorder, coronary artery disease with congestive heart failure, hypertension, hyperlipidemia, seizure disorder as well as ESRD on dialysis at least since 2015- HD at [facility] in [place], TTS schedule. I spoke to her outpatient dialysis unit. They state that she is noncompliant with her fluid restriction.  However, she was there yesterday. He was brought to the emergency department today from her nursing home for decreased mental status. She was noted to have a seizure in the emergency department, was intubated for airway protection. Meningitis was suspected but LP was clear.  Patient is noted to be quite hypertensive.  We are called to assist with her dialysis needs. Patient did get dialysis yesterday.  Patient also reportedly had 1 out of 2 blood cultures positive a couple of weeks ago. According to her outpatient kidney Center she did not receive any antibiotics but had no ill effects   Dialyzes at [facility] on [address]. 4 hours- usually needs 4-5 L removed per treatment HD Bath 2K, Dialyzer unknown, Heparin yes, 2000 bolus and 900/h. Access PC- 400 blood flow rate. TTS Epogen 6003 times a week, Iron 100 2 times a week   Past Medical History:  Diagnosis Date  . Anginal pain ([place])   . Anxiety   . Bipolar disorder ([place])   . CAD (coronary artery disease)   . CHF (congestive heart failure) ([place])   . Chronic lower back pain   . Depression   . Endometriosis   . ESRD (end stage renal disease) on dialysis ([place])    "[facility]; [place]; [place]; TTS" (01/19/2016)  . Gastroparesis   . GERD (gastroesophageal reflux disease)   . History of blood transfusion "several"   "my blood would get low; low RBC"  . History of hiatal hernia   . HLD (hyperlipidemia)   . Hypertension   . Migraine     "monthly" (01/19/2016)  . Myocardial infarction 2017   "~ 3 wks ago" (01/19/2016)  . Renal disorder   . Renal insufficiency   . Seizures ([place]) 07/2015   "I've only had the 1; don't know what from" (01/19/2016)  . Type II diabetes mellitus ([place])     Past Surgical History:  Procedure Laterality Date  . ABDOMINAL HYSTERECTOMY     "partial"  . BELOW KNEE LEG AMPUTATION Right 2010?  [name] CARDIAC CATHETERIZATION Right 07/10/2015   Procedure: Left Heart Cath and Coronary Angiography;  Surgeon: Dionisio David, MD;  Location: [place] [facility];  Service: Cardiovascular;  Laterality: Right;  . CARDIAC CATHETERIZATION Right 09/14/2015   Procedure: Left Heart Cath and Coronary Angiography;  Surgeon: Dionisio David, MD;  Location: [place] [facility];  Service: Cardiovascular;  Laterality: Right;  . CARDIAC CATHETERIZATION N/A 09/14/2015   Procedure: Coronary Stent Intervention;  Surgeon: Yolonda Kida, MD;  Location: [place] [facility];  Service: Cardiovascular;  Laterality: N/A;  . CARDIAC CATHETERIZATION Right 11/20/2015   Procedure: Left Heart Cath and Coronary Angiography;  Surgeon: Dionisio David, MD;  Location: [place];  Service: Cardiovascular;  Laterality: Right;  . CORONARY ANGIOPLASTY WITH STENT PLACEMENT  <2017   @ [facility]/notes 07/03/2013  . HYSTEROTOMY    . PERIPHERAL VASCULAR CATHETERIZATION N/A 08/30/2015   Procedure: Dialysis/Perma Catheter Insertion;  Surgeon: Algernon Huxley, MD;  Location: [facility];  Service: Cardiovascular;  Laterality: N/A;  . PERIPHERAL VASCULAR CATHETERIZATION N/A 01/01/2016   Procedure: Dialysis/Perma Catheter Insertion;  Surgeon: Algernon Huxley, MD;  Location: [place] [facility];  Service: Cardiovascular;  Laterality: N/A;  . PERITONEAL CATHETER INSERTION  11/03/2013   Archie Endo 11/03/2013  . PERITONEAL CATHETER REMOVAL  01/01/2016   "took the one from May out; put new PD cath in" (01/19/2016)  . PERITONEAL CATHETER REMOVAL  11/03/2013;  02/11/2014   Archie Endo 11/03/2013; Removal of tunneled catheter/notes 02/11/2014  . SALPINGOOPHORECTOMY Right    Archie Endo 07/03/2013  . TUBAL LIGATION      Family History  Problem Relation Age of Onset  . CAD    . Diabetes    . Bipolar disorder    . Cervical cancer Mother     Social History:  reports that she quit smoking about 11 months ago. Her smoking use included Cigarettes. She smoked 0.75 packs per day. She has never used smokeless tobacco. She reports that she drinks alcohol. She reports that she does not use drugs.  Allergies:  Allergies  Allergen Reactions  . Cephalosporins Anaphylaxis    Patient has tolerated meropenem.   [name] Penicillins Anaphylaxis and Other (See Comments)    Has patient had a PCN reaction causing immediate rash, facial/tongue/throat swelling, SOB or lightheadedness with hypotension: Yes Has patient had a PCN reaction causing severe rash involving mucus membranes or skin necrosis: No Has patient had a PCN reaction that required hospitalization No Has patient had a PCN reaction occurring within the last 10 years: No If all of the above answers are "NO", then may proceed with Cephalosporin use.  . Lamictal [Lamotrigine] Other (See Comments)    Reaction:  Hallucinations  . Phenergan [Promethazine Hcl] Nausea And Vomiting  . Pravastatin Other (See Comments)    Reaction:  Muscle pain   . Sulfa Antibiotics Other (See Comments)    Reaction:  Unknown     Medications: I have reviewed the patient's current medications.   Results for orders placed or performed during the hospital encounter of 05/31/16 (from the past 48 hour(s))  Protime-INR     Status: None   Collection Time: 05/31/16 10:30 AM  Result Value Ref Range   Prothrombin Time 13.1 11.4 - 15.2 seconds   INR 0.99   APTT     Status: None   Collection Time: 05/31/16 10:30 AM  Result Value Ref Range   aPTT 31 24 - 36 seconds  CBC     Status: None   Collection Time: 05/31/16 10:30 AM  Result Value Ref  Range   WBC 6.4 4.0 - 10.5 K/uL   RBC 4.24 3.87 - 5.11 MIL/uL   Hemoglobin 12.0 12.0 - 15.0 g/dL   HCT 37.6 36.0 - 46.0 %   MCV 88.7 78.0 - 100.0 fL   MCH 28.3 26.0 - 34.0 pg   MCHC 31.9 30.0 - 36.0 g/dL   RDW 14.4 11.5 - 15.5 %   Platelets 195 150 - 400 K/uL  Differential     Status: None   Collection Time: 05/31/16 10:30 AM  Result Value Ref Range   Neutrophils Relative % 66 %   Neutro Abs 4.2 1.7 - 7.7 K/uL   Lymphocytes Relative 24 %   Lymphs Abs 1.5 0.7 - 4.0 K/uL   Monocytes Relative 6 %   Monocytes Absolute 0.4 0.1 - 1.0 K/uL   Eosinophils Relative 3 %   Eosinophils Absolute 0.2 0.0 - 0.7 K/uL   Basophils  Relative 1 %   Basophils Absolute 0.0 0.0 - 0.1 K/uL  Comprehensive metabolic panel     Status: Abnormal   Collection Time: 05/31/16 10:30 AM  Result Value Ref Range   Sodium 132 (L) 135 - 145 mmol/L   Potassium 4.1 3.5 - 5.1 mmol/L   Chloride 96 (L) 101 - 111 mmol/L   CO2 24 22 - 32 mmol/L   Glucose, Bld 124 (H) 65 - 99 mg/dL   BUN 33 (H) 6 - 20 mg/dL   Creatinine, Ser 4.01 (H) 0.44 - 1.00 mg/dL   Calcium 9.7 8.9 - 10.3 mg/dL   Total Protein 6.2 (L) 6.5 - 8.1 g/dL   Albumin 3.1 (L) 3.5 - 5.0 g/dL   AST 15 15 - 41 U/L   ALT 11 (L) 14 - 54 U/L   Alkaline Phosphatase 66 38 - 126 U/L   Total Bilirubin 0.5 0.3 - 1.2 mg/dL   GFR calc non Af Amer 12 (L) >60 mL/min   GFR calc Af Amer 14 (L) >60 mL/min    Comment: (NOTE) The eGFR has been calculated using the CKD EPI equation. This calculation has not been validated in all clinical situations. eGFR's persistently <60 mL/min signify possible Chronic Kidney Disease.    Anion gap 12 5 - 15  CBG monitoring, ED     Status: Abnormal   Collection Time: 05/31/16 10:56 AM  Result Value Ref Range   Glucose-Capillary 125 (H) 65 - 99 mg/dL  I-stat troponin, ED     Status: None   Collection Time: 05/31/16 11:00 AM  Result Value Ref Range   Troponin i, poc 0.02 0.00 - 0.08 ng/mL   Comment 3            Comment: Due to the  release kinetics of cTnI, a negative result within the first hours of the onset of symptoms does not rule out myocardial infarction with certainty. If myocardial infarction is still suspected, repeat the test at appropriate intervals.   I-Stat Chem 8, ED     Status: Abnormal   Collection Time: 05/31/16 11:02 AM  Result Value Ref Range   Sodium 132 (L) 135 - 145 mmol/L   Potassium 3.9 3.5 - 5.1 mmol/L   Chloride 96 (L) 101 - 111 mmol/L   BUN 36 (H) 6 - 20 mg/dL   Creatinine, Ser 4.00 (H) 0.44 - 1.00 mg/dL   Glucose, Bld 127 (H) 65 - 99 mg/dL   Calcium, Ion 1.19 1.15 - 1.40 mmol/L   TCO2 28 0 - 100 mmol/L   Hemoglobin 12.6 12.0 - 15.0 g/dL   HCT 37.0 36.0 - 46.0 %  Glucose, CSF     Status: None   Collection Time: 05/31/16  1:27 PM  Result Value Ref Range   Glucose, CSF 69 40 - 70 mg/dL  Protein, CSF     Status: None   Collection Time: 05/31/16  1:27 PM  Result Value Ref Range   Total  Protein, CSF 43 15 - 45 mg/dL  CSF cell count with differential     Status: Abnormal   Collection Time: 05/31/16  1:27 PM  Result Value Ref Range   Tube # 1    Color, CSF COLORLESS COLORLESS   Appearance, CSF CLEAR CLEAR   Supernatant NOT INDICATED    RBC Count, CSF 555 (H) 0 /cu mm   WBC, CSF 6 (H) 0 - 5 /cu mm   Segmented Neutrophils-CSF RARE 0 - 6 %   Lymphs,  CSF FEW 40 - 80 %   Monocyte-Macrophage-Spinal Fluid RARE 15 - 45 %   Eosinophils, CSF NONE SEEN 0 - 1 %   Other Cells, CSF TOO FEW TO COUNT, SMEAR AVAILABLE FOR REVIEW   CSF culture     Status: None (Preliminary result)   Collection Time: 05/31/16  1:27 PM  Result Value Ref Range   Specimen Description CSF    Special Requests NONE    Gram Stain      WBC PRESENT, PREDOMINANTLY MONONUCLEAR NO ORGANISMS SEEN CYTOSPIN SMEAR    Culture PENDING    Report Status PENDING   Valproic acid level     Status: Abnormal   Collection Time: 05/31/16  2:34 PM  Result Value Ref Range   Valproic Acid Lvl <10 (L) 50.0 - 100.0 ug/mL     Comment: REPEATED TO VERIFY RESULTS CONFIRMED BY MANUAL DILUTION     Ct Angio Head W Or Wo Contrast  Result Date: 05/31/2016 CLINICAL DATA:  51 year old female code stroke. Right side weakness. Initial encounter. EXAM: CT ANGIOGRAPHY HEAD AND NECK TECHNIQUE: Multidetector CT imaging of the head and neck was performed using the standard protocol during bolus administration of intravenous contrast. Multiplanar CT image reconstructions and MIPs were obtained to evaluate the vascular anatomy. Carotid stenosis measurements (when applicable) are obtained utilizing NASCET criteria, using the distal internal carotid diameter as the denominator. CONTRAST:  50 mL Isovue 370 COMPARISON:  Noncontrast head CT 1102 hours today. FINDINGS: CTA NECK Skeleton: No acute osseous abnormality identified. Mild for age spine degeneration. Upper chest: Negative lung apices. No superior mediastinal lymphadenopathy. There is a left internal jugular approach dual lumen dialysis type catheter in place. The visible SVC is patent. Other neck: Subcentimeter left thyroid lobe and isthmus nodules which do not meet size criteria for ultrasound follow-up. Negative larynx, pharynx, parapharyngeal spaces, retropharyngeal space, sublingual space, submandibular glands and parotid glands. No lymphadenopathy. Aortic arch: 3 vessel arch configuration. Mild arch and proximal great vessels soft and calcified atherosclerotic plaque. Right carotid system: No brachiocephalic artery or right CCA origin stenosis despite some plaque. Mild circumferential soft plaque in the right CCA. Mostly calcified plaque at the right carotid bifurcation which is mild and results in less than 50 % stenosis with respect to the distal vessel. Somewhat non dominant appearing right ICA. Left carotid system: No left CCA origin stenosis despite soft and calcified plaque. Minimal soft and calcified plaque elsewhere in the left CCA. At the left carotid bifurcation there is  combination soft and calcified plaque resulting in a short segment of high-grade stenosis estimated at 70 % stenosis with respect to the distal vessel (series 402, images 124n 125). Calcified plaque continues into the left ICA bulb but there is no additional cervical left ICA stenosis. Vertebral arteries: Calcified and soft plaque at the right subclavian artery origin with no hemodynamically significant stenosis. Normal right vertebral artery origin. Normal cervical right vertebral artery. No proximal left subclavian artery stenosis despite soft and calcified plaque. There is soft plaque at the left vertebral artery origin with mild or at most moderate stenosis (series 403, image 138). Mild left V2 segment calcified plaque without stenosis. The left vertebral artery is mildly non dominant. CTA HEAD Posterior circulation: Right V4 segment calcified plaque with mild distal right vertebral artery stenosis. Patent vertebrobasilar junction. Combined soft and calcified left V4 segment plaque with similar mild stenosis. Normal left vertebrobasilar junction. Both a ICAs appear dominant. No basilar stenosis. Normal SCA and PCA origins. Diminutive posterior  communicating arteries. Bilateral PCA branches are within normal limits. Anterior circulation: Both ICA siphons are patent. There is moderate to severe bilateral siphon calcified plaque worse on the right. On the left side there is moderate cavernous left ICA stenosis (series 404, images 129 in 04/1928. On the right there is moderate to severe stenosis at both the proximal and distal cavernous segments (series 402, image 239). Normal ophthalmic artery origins. Carotid termini are patent. The left A1 segment is dominant and the right A1 is diminutive or absent. Anterior communicating artery and bilateral ACA branches are within normal limits. Right MCA M1 segment, right MCA bifurcation, and right MCA branches are within normal limits. The left MCA M1 segment is patent and  bifurcates early. There is a patent left anterior temporal artery branch on series 404, image 133. The left MCA bifurcation then is patent. No left MCA branch occlusion or significant stenosis is identified. Venous sinuses: Patent. Anatomic variants: Mildly dominant right vertebral artery. Mildly dominant left ICA, with dominant left and diminutive or absent right ACA A1 segments. Review of the MIP images confirms the above findings IMPRESSION: 1. Negative for emergent large vessel occlusion. This was preliminarily discussed with Dr. Roland Rack via telephone at 1141 hours. 2. 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 3. There is also extensive ICA siphon calcified plaque with moderate left and moderate to severe right ICA siphon stenosis. 4. Mildly dominant right vertebral artery. Mild bilateral V4 segment vertebral artery stenosis, with mild to moderate stenosis at the origin of the left vertebral artery. 5. Left IJ approach dual lumen dialysis type catheter in place. Electronically Signed   By: Genevie Ann M.D.   On: 05/31/2016 11:56   Ct Head Wo Contrast  Result Date: 05/31/2016 CLINICAL DATA:  51 year old female with right side weakness. Code stroke. Initial encounter. EXAM: CT HEAD WITHOUT CONTRAST TECHNIQUE: Contiguous axial images were obtained from the base of the skull through the vertex without intravenous contrast. COMPARISON:  Head CT 02/25/2016 FINDINGS: Brain: Gray-white matter differentiation appears stable and within normal limits throughout the brain. No cortically based acute infarct identified. No acute intracranial hemorrhage identified. No midline shift, mass effect, or evidence of intracranial mass lesion. No ventriculomegaly. Vascular: Calcified atherosclerosis at the skull base. No suspicious intracranial vascular hyperdensity. Skull: No acute osseous abnormality identified. Sinuses/Orbits: Mild mucosal thickening in the  posterior left maxillary sinus. Other Visualized paranasal sinuses and mastoids are stable and well pneumatized. Other: Orbit and scalp soft tissues appear stable and negative. ASPECTS score = 10 [place] Stroke Program Early CT Score Normal score = 10 IMPRESSION: 1. Stable and normal Normal noncontrast CT appearance of the brain. 2. ASPECTS 10. 3. The above was relayed via text pager to Dr. Roland Rack on 05/31/2016 at 11:09 . Electronically Signed   By: Genevie Ann M.D.   On: 05/31/2016 11:09   Ct Angio Neck W Or Wo Contrast  Result Date: 05/31/2016 CLINICAL DATA:  51 year old female code stroke. Right side weakness. Initial encounter. EXAM: CT ANGIOGRAPHY HEAD AND NECK TECHNIQUE: Multidetector CT imaging of the head and neck was performed using the standard protocol during bolus administration of intravenous contrast. Multiplanar CT image reconstructions and MIPs were obtained to evaluate the vascular anatomy. Carotid stenosis measurements (when applicable) are obtained utilizing NASCET criteria, using the distal internal carotid diameter as the denominator. CONTRAST:  50 mL Isovue 370 COMPARISON:  Noncontrast head CT 1102 hours today. FINDINGS: CTA NECK Skeleton: No  acute osseous abnormality identified. Mild for age spine degeneration. Upper chest: Negative lung apices. No superior mediastinal lymphadenopathy. There is a left internal jugular approach dual lumen dialysis type catheter in place. The visible SVC is patent. Other neck: Subcentimeter left thyroid lobe and isthmus nodules which do not meet size criteria for ultrasound follow-up. Negative larynx, pharynx, parapharyngeal spaces, retropharyngeal space, sublingual space, submandibular glands and parotid glands. No lymphadenopathy. Aortic arch: 3 vessel arch configuration. Mild arch and proximal great vessels soft and calcified atherosclerotic plaque. Right carotid system: No brachiocephalic artery or right CCA origin stenosis despite some plaque.  Mild circumferential soft plaque in the right CCA. Mostly calcified plaque at the right carotid bifurcation which is mild and results in less than 50 % stenosis with respect to the distal vessel. Somewhat non dominant appearing right ICA. Left carotid system: No left CCA origin stenosis despite soft and calcified plaque. Minimal soft and calcified plaque elsewhere in the left CCA. At the left carotid bifurcation there is combination soft and calcified plaque resulting in a short segment of high-grade stenosis estimated at 70 % stenosis with respect to the distal vessel (series 402, images 124n 125). Calcified plaque continues into the left ICA bulb but there is no additional cervical left ICA stenosis. Vertebral arteries: Calcified and soft plaque at the right subclavian artery origin with no hemodynamically significant stenosis. Normal right vertebral artery origin. Normal cervical right vertebral artery. No proximal left subclavian artery stenosis despite soft and calcified plaque. There is soft plaque at the left vertebral artery origin with mild or at most moderate stenosis (series 403, image 138). Mild left V2 segment calcified plaque without stenosis. The left vertebral artery is mildly non dominant. CTA HEAD Posterior circulation: Right V4 segment calcified plaque with mild distal right vertebral artery stenosis. Patent vertebrobasilar junction. Combined soft and calcified left V4 segment plaque with similar mild stenosis. Normal left vertebrobasilar junction. Both a ICAs appear dominant. No basilar stenosis. Normal SCA and PCA origins. Diminutive posterior communicating arteries. Bilateral PCA branches are within normal limits. Anterior circulation: Both ICA siphons are patent. There is moderate to severe bilateral siphon calcified plaque worse on the right. On the left side there is moderate cavernous left ICA stenosis (series 404, images 129 in 04/1928. On the right there is moderate to severe stenosis at  both the proximal and distal cavernous segments (series 402, image 239). Normal ophthalmic artery origins. Carotid termini are patent. The left A1 segment is dominant and the right A1 is diminutive or absent. Anterior communicating artery and bilateral ACA branches are within normal limits. Right MCA M1 segment, right MCA bifurcation, and right MCA branches are within normal limits. The left MCA M1 segment is patent and bifurcates early. There is a patent left anterior temporal artery branch on series 404, image 133. The left MCA bifurcation then is patent. No left MCA branch occlusion or significant stenosis is identified. Venous sinuses: Patent. Anatomic variants: Mildly dominant right vertebral artery. Mildly dominant left ICA, with dominant left and diminutive or absent right ACA A1 segments. Review of the MIP images confirms the above findings IMPRESSION: 1. Negative for emergent large vessel occlusion. This was preliminarily discussed with Dr. Roland Rack via telephone at 1141 hours. 2. 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 3. There is also extensive ICA siphon calcified plaque with moderate left and moderate to severe right ICA siphon stenosis. 4. Mildly dominant right vertebral artery. Mild bilateral V4 segment  vertebral artery stenosis, with mild to moderate stenosis at the origin of the left vertebral artery. 5. Left IJ approach dual lumen dialysis type catheter in place. Electronically Signed   By: Genevie Ann M.D.   On: 05/31/2016 11:56   Dg Chest Portable 1 View  Result Date: 05/31/2016 CLINICAL DATA:  Post intubation EXAM: PORTABLE CHEST 1 VIEW COMPARISON:  05/18/2016 FINDINGS: Cardiomegaly again noted. Double lumen left IJ catheter is unchanged in position. There is endotracheal tube in place with tip 2.7 cm above the carina. NG tube in place. The tip of the NG tube is not included on the film. Central mild vascular congestion  without convincing pulmonary edema. Mild right basilar atelectasis. No segmental infiltrate. IMPRESSION: Double lumen left IJ catheter is unchanged in position. There is endotracheal tube in place with tip 2.7 cm above the carina. NG tube in place. The tip of the NG tube is not included on the film. Central mild vascular congestion without convincing pulmonary edema. Mild right basilar atelectasis. No segmental infiltrate. Electronically Signed   By: Lahoma Crocker M.D.   On: 05/31/2016 15:57   Dg Fluoro Guide Lumbar Puncture  Result Date: 05/31/2016 CLINICAL DATA:  51 year old female with altered mental status, right side weakness. Code stroke workup thus far today is negative for emergent large vessel occlusion or CT evidence of cortically based infarct. Blood cultures are now positive for gram positive cocci. Diagnostic lumbar puncture requested. Initial encounter. EXAM: DIAGNOSTIC LUMBAR PUNCTURE UNDER FLUOROSCOPIC GUIDANCE FLUOROSCOPY TIME:  Fluoroscopy Time:  0 minutes 6 seconds Radiation Exposure Index (if provided by the fluoroscopic device): Number of Acquired Spot Images: 0 PROCEDURE: Informed consent was obtained from the patient's daughter prior to the procedure, including potential complications of headache, allergy, and pain. A "time-out" was performed. With the patient prone, the lower back was prepped with Betadine. 1% Lidocaine was used for local anesthesia. Lumbar puncture was performed at the L3-L4 level using left sub laminar technique and a 3.5 inch x 20 gauge needle with return of clear CSF with an opening pressure of 18 cm of water. 16 mL of CSF were obtained for laboratory studies. The patient tolerated the procedure well and there were no apparent complications. IMPRESSION: Diagnostic lumbar puncture at L3-L4. Clear CSF with opening pressure of 18 cm of water. 16 mL of CSF obtained for laboratory studies. Electronically Signed   By: Genevie Ann M.D.   On: 05/31/2016 13:50    ROS: Unable to  obtain as patient is intubated Blood pressure (!) 234/104, pulse 86, temperature (!) 96.4 F (35.8 C), temperature source Rectal, resp. rate 12, height _0  (1.753 m), weight 109.4 kg (241 lb 2.9 oz), last menstrual period 03/16/2015, SpO2 97 %. General appearance: moderately obese and pale, sedated on vent Eyes: conjunctivae/corneas clear. PERRL, EOM's intact. Fundi benign. 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 Presenting with decreased mental status as well as seizure 1 seizure/decreased mental status- patient has a history of seizure disorder. Neurology has been consulted. Lumbar puncture was without evidence of meningitis. Could this possibly be hypertensive encephalopathy? Supportive care and antiseizure medications. HCT negative times 2- on keppra.  Also getting abx 2 ESRD: Normally TTS with [employer] [facility]. We  will plan for dialysis tomorrow via [facility]. 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  Supposedly on amlodipine, lisinopril, Lopressor, and hydralazine but again with a history of noncompliance.  Will likely need intravenous medication (cleviprex) in the interim but dialysis will help bring down as well 4. Anemia of ESRD: Does not appear to be an issue at this time 5. 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 Will resume Renvela once extubated   Caryn Gienger A 05/31/2016, 4:35 PM

## 2016-05-31 NOTE — Consult Note (Signed)
Neurology Consultation Reason for Consult: Altered mental status Referring Physician: Corlis Leak, C  CC: Altered mental status  History is obtained from: EMS  HPI: Kimberly Montgomery is a 51 y.o. female was in her normal state of health until this morning when staff found her altered. EMS was called and she was transported in a code stroke was activated due to the sudden change. On arrival of the bridge, she was having clonic activity of the face and right arm. She was taken for head CT and given 2 mg of IV Ativan. Following the Ativan, clonic activity ceased but the patient remained obtunded. A stat EEG was performed to rule out ongoing seizure and this was negative. CTA was also performed which did not demonstrate any large vessel occlusion.  Given that she had a recent positive blood culture, though this was felt to likely be contaminant, is presenting with new seizures, altered mental status, and has neck stiffness an LP was recommended.  LKW: 9:35 AM tpa given?: no, seizure at onset   ROS:  Unable to obtain due to altered mental status.   Past Medical History:  Diagnosis Date  . Anginal pain (HCC)   . Anxiety   . Bipolar disorder (HCC)   . CAD (coronary artery disease)   . CHF (congestive heart failure) (HCC)   . Chronic lower back pain   . Depression   . Endometriosis   . ESRD (end stage renal disease) on dialysis (HCC)    "[facility]; [address]; [place]; TTS" (01/19/2016)  . Gastroparesis   . GERD (gastroesophageal reflux disease)   . History of blood transfusion "several"   "my blood would get low; low RBC"  . History of hiatal hernia   . HLD (hyperlipidemia)   . Hypertension   . Migraine    "monthly" (01/19/2016)  . Myocardial infarction 2017   "~ 3 wks ago" (01/19/2016)  . Renal disorder   . Renal insufficiency   . Seizures (HCC) 07/2015   "I've only had the 1; don't know what from" (01/19/2016)  . Type II diabetes mellitus (HCC)      Family History  Problem  Relation Age of Onset  . CAD    . Diabetes    . Bipolar disorder    . Cervical cancer Mother      Social History:  reports that she quit smoking about 11 months ago. Her smoking use included Cigarettes. She smoked 0.75 packs per day. She has never used smokeless tobacco. She reports that she drinks alcohol. She reports that she does not use drugs.   Exam: Current vital signs: BP 165/68   Pulse (!) 56   Temp (!) 96.4 F (35.8 C) (Rectal)   Resp 11   Ht 5\' 9"  (1.753 m)   Wt 109.4 kg (241 lb 2.9 oz)   LMP 03/16/2015   SpO2 98%   BMI 35.62 kg/m  Vital signs in last 24 hours: Temp:  [96.4 F (35.8 C)-98.3 F (36.8 C)] 96.4 F (35.8 C) (03/02 1405) Pulse Rate:  [56-65] 56 (03/02 1430) Resp:  [10-14] 11 (03/02 1430) BP: (134-165)/(47-68) 165/68 (03/02 1430) SpO2:  [98 %-100 %] 98 % (03/02 1430) Weight:  [109.4 kg (241 lb 2.9 oz)] 109.4 kg (241 lb 2.9 oz) (03/02 1100)   Physical Exam  Constitutional: Appears obese Psych: Affect appropriate to situation Eyes: No scleral injection HENT: No OP obstrucion Head: Normocephalic.  Cardiovascular: Normal rate and regular rhythm.  Respiratory: Effort normal  GI: Previous scars,? Peritoneal  dialysis Skin: [facility]  Neuro: Mental Status: Patient is obtunded, does not follow commands. Cranial Nerves: II: She has a clear right hemianopia Pupils are equal, round, and reactive to light.   III,IV, VI: Initially with left gaze deviation, but following Ativan and this resolves. V: Facial sensation is symmetric to temperature VII: Facial movement is notable for clonic activity on the right prior to Ativan administration VIII: hearing is intact to voice X: Uvula elevates symmetrically XI: Shoulder shrug is symmetric. XII: tongue is midline without atrophy or fasciculations.  Motor: She initially moves her left side better than her right, with clonic activity of her right arm. Falling Ativan administration she begins moving her right arm  better. Sensory: She does respond to noxious stimulation in all 4 extremities Cerebellar: Does not perform  She does have significant neck stiffness   I have reviewed labs in epic and the results pertinent to this consultation are: CMP-mild hyponatremia, elevated creatinine  I have reviewed the images obtained: CT head-no acute findings  Impression: 51 year old female with new focal seizures. She does have a history of a single previous seizure and is on Keppra. Due to the ongoing nature, she was loaded with IV Depakote. Another time of EEG, she was a longer seizing.  Though she did receive a dose of Depakote, it would be reasonable rather than starting a truly new agent to increase her total Keppra dose.  Recommendations: 1) increase Keppra to 1 g daily, with additional 500 mg after dialysis 2) LP to assess for meningitis, abx in the interim 3) will continue to follow   Ritta Slot, MD [facility] [phone]  If 7pm- 7am, please page neurology on call as listed in [facility].

## 2016-05-31 NOTE — Progress Notes (Signed)
EEG completed, results pending. 

## 2016-05-31 NOTE — ED Notes (Signed)
On exam, nuchal rigidity noted. Pt placed on droplet precaution.

## 2016-05-31 NOTE — ED Notes (Signed)
Daughter called to check in on patient and reported that patient has recently been restarted on Wellbutrin.

## 2016-05-31 NOTE — Procedures (Addendum)
History: 51 year old female with altered mental status percent seizure.  Sedation: Ativan  Technique: This is a 21 channel routine scalp EEG performed at the bedside with bipolar and monopolar montages arranged in accordance to the international 10/20 system of electrode placement. One channel was dedicated to EKG recording.    Background: There is a posterior dominant rhythm of 6-7 Hz which appears anteriorly shifted. There is generalized irregular delta and theta slowing. No epileptiform activity was recorded. No sleep is recorded.  Photic stimulation: Physiologic driving is not performed  EEG Abnormalities: 1) Generalized irregular slow activity  Clinical Interpretation: This EEG is consistent with a generalized non-specific cerebral dysfunction(encephalopathy). There was no seizure or seizure predisposition recorded on this study.   Kimberly Slot, MD [facility] [phone]  If 7pm- 7am, please page neurology on call as listed in [facility]ION.

## 2016-05-31 NOTE — Procedures (Signed)
Pt transported to MRI and back on vent without complications.

## 2016-05-31 NOTE — Progress Notes (Addendum)
ICU RT called RT report.  Pt was transported to/from CT on vent w/ no apparent complications.

## 2016-05-31 NOTE — Procedures (Signed)
Procedure: LP @ L3-4. Specimen: CSF Bleeding: minimal. Complications: None immediate. Patient   -Condition: Stable.  -Disposition:  To ED or inpt floor.  Full Radiology Report to Follow under IMAGING

## 2016-05-31 NOTE — ED Provider Notes (Addendum)
[facility] DEPT Provider Note   CSN: [id number] Arrival date & time: 05/31/16  1053     History   Chief Complaint Chief Complaint  Patient presents with  . Code Stroke    HPI Kimberly Montgomery is a 51 y.o. female.  HPI   Patient is a 51 year old female presenting with altered mental status. Initially called a code stroke. Patient unable to give history. On arrival patient appeared to be seizing with a left deviated gaze and right seizure activity.  Brought immediatley to CT scan.   Past Medical History:  Diagnosis Date  . Anginal pain (HCC)   . Anxiety   . Bipolar disorder (HCC)   . CAD (coronary artery disease)   . CHF (congestive heart failure) (HCC)   . Chronic lower back pain   . Depression   . Endometriosis   . ESRD (end stage renal disease) on dialysis (HCC)    "[facility]; [address]; [place]; TTS" (01/19/2016)  . Gastroparesis   . GERD (gastroesophageal reflux disease)   . History of blood transfusion "several"   "my blood would get low; low RBC"  . History of hiatal hernia   . HLD (hyperlipidemia)   . Hypertension   . Migraine    "monthly" (01/19/2016)  . Myocardial infarction 2017   "~ 3 wks ago" (01/19/2016)  . Renal disorder   . Renal insufficiency   . Seizures (HCC) 07/2015   "I've only had the 1; don't know what from" (01/19/2016)  . Type II diabetes mellitus [facility])     Patient Active Problem List   Diagnosis Date Noted  . Complication from renal dialysis device 04/08/2016  . Colitis 03/13/2016  . Chronic diastolic congestive heart failure (HCC) 01/22/2016  . Pressure injury of skin 01/20/2016  . Bacteremia 12/27/2015  . Essential hypertension 11/21/2015  . Anemia of chronic disease 11/21/2015  . Acute respiratory failure with hypoxia (HCC) 11/21/2015  . ESRD on dialysis (HCC) 11/21/2015  . 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 NSTEMI (non-ST elevated myocardial infarction) (HCC) 11/20/2015  . Chest pain, rule out acute myocardial infarction  11/18/2015  . Bipolar I disorder, most recent episode depressed (HCC)   . Altered mental status 08/24/2015  . Ileus (HCC)   . Bipolar I disorder (HCC) 07/25/2015  . Seizures (HCC) 07/25/2015  . Peritonitis (HCC) 07/16/2015  . Unstable angina (HCC) 07/09/2015  . ESRD on peritoneal dialysis (HCC) 07/09/2015  . Accelerated hypertension 07/09/2015  . Type 2 diabetes mellitus (HCC) 07/09/2015  . CAD (coronary artery disease) 07/09/2015  . HLD (hyperlipidemia) 07/09/2015  . GERD (gastroesophageal reflux disease) 07/09/2015    Past Surgical History:  Procedure Laterality Date  . ABDOMINAL HYSTERECTOMY     "partial"  . BELOW KNEE LEG AMPUTATION Right 2010?  [name] CARDIAC CATHETERIZATION Right 07/10/2015   Procedure: Left Heart Cath and Coronary Angiography;  Surgeon: Laurier Nancy, MD;  Location: [facility] INVASIVE CV LAB;  Service: Cardiovascular;  Laterality: Right;  . CARDIAC CATHETERIZATION Right 09/14/2015   Procedure: Left Heart Cath and Coronary Angiography;  Surgeon: Laurier Nancy, MD;  Location: [facility] INVASIVE CV LAB;  Service: Cardiovascular;  Laterality: Right;  . CARDIAC CATHETERIZATION N/A 09/14/2015   Procedure: Coronary Stent Intervention;  Surgeon: Alwyn Pea, MD;  Location: [facility] INVASIVE CV LAB;  Service: Cardiovascular;  Laterality: N/A;  . CARDIAC CATHETERIZATION Right 11/20/2015   Procedure: Left Heart Cath and Coronary Angiography;  Surgeon: Laurier Nancy, MD;  Location: [facility] INVASIVE CV LAB;  Service: Cardiovascular;  Laterality:  Right;  [name] CORONARY ANGIOPLASTY WITH STENT PLACEMENT  <2017   @ [facility]/notes 07/03/2013  . HYSTEROTOMY    . PERIPHERAL VASCULAR CATHETERIZATION N/A 08/30/2015   Procedure: Dialysis/Perma Catheter Insertion;  Surgeon: Annice Needy, MD;  Location: [facility] INVASIVE CV LAB;  Service: Cardiovascular;  Laterality: N/A;  . PERIPHERAL VASCULAR CATHETERIZATION N/A 01/01/2016   Procedure: Dialysis/Perma Catheter Insertion;  Surgeon: Annice Needy, MD;  Location: [facility]  INVASIVE CV LAB;  Service: Cardiovascular;  Laterality: N/A;  . PERITONEAL CATHETER INSERTION  11/03/2013   [name] 11/03/2013  . PERITONEAL CATHETER REMOVAL  01/01/2016   "took the one from May out; put new PD cath in" (01/19/2016)  . PERITONEAL CATHETER REMOVAL  11/03/2013; 02/11/2014   [name] 11/03/2013; Removal of tunneled catheter/notes 02/11/2014  . SALPINGOOPHORECTOMY Right    [name] 07/03/2013  . TUBAL LIGATION      OB History    No data available       Home Medications    Prior to Admission medications   Medication Sig Start Date End Date Taking? Authorizing Provider  Amino Acids-Protein Hydrolys (FEEDING SUPPLEMENT, PRO-STAT SUGAR FREE 64,) LIQD Take 30 mLs by mouth 3 (three) times daily with meals.    Historical Provider, MD  amLODipine (NORVASC) 10 MG tablet Take 10 mg by mouth daily.     Historical Provider, MD  aspirin EC 81 MG tablet Take 81 mg by mouth daily.    Historical Provider, MD  azithromycin (ZITHROMAX) 250 MG tablet One tab daily for 4 more days 05/18/16   Governor Rooks, MD  buPROPion [facility] SR) 150 MG 12 hr tablet Take 150 mg by mouth daily.     Historical Provider, MD  calcium carbonate (TUMS - DOSED IN MG ELEMENTAL CALCIUM) 500 MG chewable tablet Chew 1 tablet (200 mg of elemental calcium total) by mouth 3 (three) times daily with meals. 08/02/15   Adrian Saran, MD  carvedilol (COREG) 6.25 MG tablet Take 6.25 mg by mouth 2 (two) times daily with a meal.    Historical Provider, MD  clopidogrel (PLAVIX) 75 MG tablet Take 75 mg by mouth daily.    Historical Provider, MD  fluticasone (FLONASE) 50 MCG/ACT nasal spray Place 1 spray into both nostrils daily.     Historical Provider, MD  gabapentin (NEURONTIN) 300 MG capsule Take 300 mg by mouth 3 (three) times daily.    Historical Provider, MD  hydrALAZINE (APRESOLINE) 25 MG tablet Take 25 mg by mouth daily.     Historical Provider, MD  insulin aspart (NOVOLOG) 100 UNIT/ML injection Inject 10 Units into the skin 3  (three) times daily with meals.     Historical Provider, MD  insulin glargine (LANTUS) 100 UNIT/ML injection Inject 0.08 mLs (8 Units total) into the skin at bedtime. 08/30/15   Milagros Loll, MD  isosorbide mononitrate (IMDUR) 60 MG 24 hr tablet Take 1.5 tablets (90 mg total) by mouth daily. 01/20/16   Vassie Loll, MD  lactulose [facility]) 10 GM/15ML solution Take 10 g by mouth daily.     Historical Provider, MD  levETIRAcetam (KEPPRA) 500 MG tablet Take 1 tablet (500 mg total) by mouth every morning. 08/02/15   Adrian Saran, MD  lisinopril (PRINIVIL,ZESTRIL) 10 MG tablet Take 1 tablet (10 mg total) by mouth daily. 01/21/16   Vassie Loll, MD  metoCLOPramide (REGLAN) 10 MG tablet Take 10 mg by mouth 4 (four) times daily.    Historical Provider, MD  metoprolol (LOPRESSOR) 50 MG tablet Take 1 tablet (50 mg total)  by mouth 2 (two) times daily. 09/15/15   Milagros Loll, MD  multivitamin (RENA-VIT) TABS tablet Take 1 tablet by mouth daily.    Historical Provider, MD  nitroGLYCERIN (NITROSTAT) 0.4 MG SL tablet Place 0.4 mg under the tongue every 5 (five) minutes as needed for chest pain. Reported on 07/10/2015    Historical Provider, MD  omeprazole (PRILOSEC) 20 MG capsule Take 20 mg by mouth 2 (two) times daily before a meal.     Historical Provider, MD  ondansetron (ZOFRAN ODT) 4 MG disintegrating tablet Take 1 tablet (4 mg total) by mouth every 6 (six) hours as needed for nausea or vomiting. 07/16/15   Sharyn Creamer, MD  oxyCODONE-acetaminophen (PERCOCET/ROXICET) 5-325 MG tablet Take one tablet by mouth every 6 hours as needed for moderate pain. Do not exceed 4gm of Tylenol in 24 hours 03/16/16   Ramonita Lab, MD  polyethylene glycol (MIRALAX / GLYCOLAX) packet Take 17 g by mouth daily. 08/02/15   Adrian Saran, MD  risperiDONE (RISPERDAL) 1 MG tablet Take 1 mg by mouth 3 (three) times daily. Take tablets at bedtime    Historical Provider, MD  senna (SENOKOT) 8.6 MG TABS tablet Take 2 tablets by mouth 2 (two) times  daily.     Historical Provider, MD  sevelamer carbonate (RENVELA) 800 MG tablet Take 800 mg by mouth 4 (four) times daily. Take 3 times with meals and 1 with snacks.    Historical Provider, MD  torsemide (DEMADEX) 100 MG tablet Take 100 mg by mouth daily.     Historical Provider, MD  trihexyphenidyl (ARTANE) 2 MG tablet Take 1 mg by mouth daily.    Historical Provider, MD    Family History Family History  Problem Relation Age of Onset  . CAD    . Diabetes    . Bipolar disorder    . Cervical cancer Mother     Social History Social History  Substance Use Topics  . Smoking status: Former Smoker    Packs/day: 0.75    Types: Cigarettes    Quit date: 07/01/2015  . Smokeless tobacco: Never Used  . Alcohol use 0.0 oz/week     Comment: 01/19/2016 "might have a couple drinks/year"     Allergies   Cephalosporins; Penicillins; Lamictal [lamotrigine]; Phenergan [promethazine hcl]; Pravastatin; and Sulfa antibiotics   Review of Systems Review of Systems  Unable to perform ROS: Acuity of condition     Physical Exam Updated Vital Signs BP 160/59   Pulse 60   Temp 98.3 F (36.8 C) (Oral)   Resp 11   Ht 5\' 9"  (1.753 m)   Wt 241 lb 2.9 oz (109.4 kg)   LMP 03/16/2015   SpO2 100%   BMI 35.62 kg/m   Physical Exam  Constitutional: She appears well-developed and well-nourished.  Patient seizing.  HENT:  Head: Normocephalic and atraumatic.  Eyes: Right eye exhibits no discharge.  Cardiovascular: Normal rate and regular rhythm.   Pulmonary/Chest: Effort normal and breath sounds normal.  Abdominal: Soft.  Musculoskeletal:  seizing  Neurological: No cranial nerve deficit. Coordination normal.  Skin: Skin is warm and dry. She is not diaphoretic.  Nursing note and vitals reviewed.    ED Treatments / Results  Labs (all labs ordered are listed, but only abnormal results are displayed) Labs Reviewed  COMPREHENSIVE METABOLIC PANEL - Abnormal; Notable for the following:        Result Value   Sodium 132 (*)    Chloride 96 (*)    Glucose,  Bld 124 (*)    BUN 33 (*)    Creatinine, Ser 4.01 (*)    Total Protein 6.2 (*)    Albumin 3.1 (*)    ALT 11 (*)    GFR calc non Af Amer 12 (*)    GFR calc Af Amer 14 (*)    All other components within normal limits  CBG MONITORING, ED - Abnormal; Notable for the following:    Glucose-Capillary 125 (*)    All other components within normal limits  I-STAT CHEM 8, ED - Abnormal; Notable for the following:    Sodium 132 (*)    Chloride 96 (*)    BUN 36 (*)    Creatinine, Ser 4.00 (*)    Glucose, Bld 127 (*)    All other components within normal limits  PROTIME-INR  APTT  CBC  DIFFERENTIAL  I-STAT TROPOININ, ED  CBG MONITORING, ED    EKG  EKG Interpretation None       Radiology Ct Angio Head W Or Wo Contrast  Result Date: 05/31/2016 CLINICAL DATA:  51 year old female code stroke. Right side weakness. Initial encounter. EXAM: CT ANGIOGRAPHY HEAD AND NECK TECHNIQUE: Multidetector CT imaging of the head and neck was performed using the standard protocol during bolus administration of intravenous contrast. Multiplanar CT image reconstructions and MIPs were obtained to evaluate the vascular anatomy. Carotid stenosis measurements (when applicable) are obtained utilizing NASCET criteria, using the distal internal carotid diameter as the denominator. CONTRAST:  50 mL Isovue 370 COMPARISON:  Noncontrast head CT 1102 hours today. FINDINGS: CTA NECK Skeleton: No acute osseous abnormality identified. Mild for age spine degeneration. Upper chest: Negative lung apices. No superior mediastinal lymphadenopathy. There is a left internal jugular approach dual lumen dialysis type catheter in place. The visible SVC is patent. Other neck: Subcentimeter left thyroid lobe and isthmus nodules which do not meet size criteria for ultrasound follow-up. Negative larynx, pharynx, parapharyngeal spaces, retropharyngeal space, sublingual space,  submandibular glands and parotid glands. No lymphadenopathy. Aortic arch: 3 vessel arch configuration. Mild arch and proximal great vessels soft and calcified atherosclerotic plaque. Right carotid system: No brachiocephalic artery or right CCA origin stenosis despite some plaque. Mild circumferential soft plaque in the right CCA. Mostly calcified plaque at the right carotid bifurcation which is mild and results in less than 50 % stenosis with respect to the distal vessel. Somewhat non dominant appearing right ICA. Left carotid system: No left CCA origin stenosis despite soft and calcified plaque. Minimal soft and calcified plaque elsewhere in the left CCA. At the left carotid bifurcation there is combination soft and calcified plaque resulting in a short segment of high-grade stenosis estimated at 70 % stenosis with respect to the distal vessel (series 402, images 124n 125). Calcified plaque continues into the left ICA bulb but there is no additional cervical left ICA stenosis. Vertebral arteries: Calcified and soft plaque at the right subclavian artery origin with no hemodynamically significant stenosis. Normal right vertebral artery origin. Normal cervical right vertebral artery. No proximal left subclavian artery stenosis despite soft and calcified plaque. There is soft plaque at the left vertebral artery origin with mild or at most moderate stenosis (series 403, image 138). Mild left V2 segment calcified plaque without stenosis. The left vertebral artery is mildly non dominant. CTA HEAD Posterior circulation: Right V4 segment calcified plaque with mild distal right vertebral artery stenosis. Patent vertebrobasilar junction. Combined soft and calcified left V4 segment plaque with similar mild stenosis. Normal left vertebrobasilar junction.  Both a ICAs appear dominant. No basilar stenosis. Normal SCA and PCA origins. Diminutive posterior communicating arteries. Bilateral PCA branches are within normal limits.  Anterior circulation: Both ICA siphons are patent. There is moderate to severe bilateral siphon calcified plaque worse on the right. On the left side there is moderate cavernous left ICA stenosis (series 404, images 129 in 04/1928. On the right there is moderate to severe stenosis at both the proximal and distal cavernous segments (series 402, image 239). Normal ophthalmic artery origins. Carotid termini are patent. The left A1 segment is dominant and the right A1 is diminutive or absent. Anterior communicating artery and bilateral ACA branches are within normal limits. Right MCA M1 segment, right MCA bifurcation, and right MCA branches are within normal limits. The left MCA M1 segment is patent and bifurcates early. There is a patent left anterior temporal artery branch on series 404, image 133. The left MCA bifurcation then is patent. No left MCA branch occlusion or significant stenosis is identified. Venous sinuses: Patent. Anatomic variants: Mildly dominant right vertebral artery. Mildly dominant left ICA, with dominant left and diminutive or absent right ACA A1 segments. Review of the MIP images confirms the above findings IMPRESSION: 1. Negative for emergent large vessel occlusion. This was preliminarily discussed with Dr. Ritta Slot via telephone at 1141 hours. 2. 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 3. There is also extensive ICA siphon calcified plaque with moderate left and moderate to severe right ICA siphon stenosis. 4. Mildly dominant right vertebral artery. Mild bilateral V4 segment vertebral artery stenosis, with mild to moderate stenosis at the origin of the left vertebral artery. 5. Left IJ approach dual lumen dialysis type catheter in place. Electronically Signed   By: Odessa Fleming M.D.   On: 05/31/2016 11:56   Ct Head Wo Contrast  Result Date: 05/31/2016 CLINICAL DATA:  51 year old female with right side weakness. Code  stroke. Initial encounter. EXAM: CT HEAD WITHOUT CONTRAST TECHNIQUE: Contiguous axial images were obtained from the base of the skull through the vertex without intravenous contrast. COMPARISON:  Head CT 02/25/2016 FINDINGS: Brain: Gray-white matter differentiation appears stable and within normal limits throughout the brain. No cortically based acute infarct identified. No acute intracranial hemorrhage identified. No midline shift, mass effect, or evidence of intracranial mass lesion. No ventriculomegaly. Vascular: Calcified atherosclerosis at the skull base. No suspicious intracranial vascular hyperdensity. Skull: No acute osseous abnormality identified. Sinuses/Orbits: Mild mucosal thickening in the posterior left maxillary sinus. Other Visualized paranasal sinuses and mastoids are stable and well pneumatized. Other: Orbit and scalp soft tissues appear stable and negative. ASPECTS score = 10 [place] Stroke Program Early CT Score Normal score = 10 IMPRESSION: 1. Stable and normal Normal noncontrast CT appearance of the brain. 2. ASPECTS 10. 3. The above was relayed via text pager to Dr. Ritta Slot on 05/31/2016 at 11:09 . Electronically Signed   By: Odessa Fleming M.D.   On: 05/31/2016 11:09   Ct Angio Neck W Or Wo Contrast  Result Date: 05/31/2016 CLINICAL DATA:  51 year old female code stroke. Right side weakness. Initial encounter. EXAM: CT ANGIOGRAPHY HEAD AND NECK TECHNIQUE: Multidetector CT imaging of the head and neck was performed using the standard protocol during bolus administration of intravenous contrast. Multiplanar CT image reconstructions and MIPs were obtained to evaluate the vascular anatomy. Carotid stenosis measurements (when applicable) are obtained utilizing NASCET criteria, using the distal internal carotid diameter as the denominator. CONTRAST:  50 mL  Isovue 370 COMPARISON:  Noncontrast head CT 1102 hours today. FINDINGS: CTA NECK Skeleton: No acute osseous abnormality identified. Mild  for age spine degeneration. Upper chest: Negative lung apices. No superior mediastinal lymphadenopathy. There is a left internal jugular approach dual lumen dialysis type catheter in place. The visible SVC is patent. Other neck: Subcentimeter left thyroid lobe and isthmus nodules which do not meet size criteria for ultrasound follow-up. Negative larynx, pharynx, parapharyngeal spaces, retropharyngeal space, sublingual space, submandibular glands and parotid glands. No lymphadenopathy. Aortic arch: 3 vessel arch configuration. Mild arch and proximal great vessels soft and calcified atherosclerotic plaque. Right carotid system: No brachiocephalic artery or right CCA origin stenosis despite some plaque. Mild circumferential soft plaque in the right CCA. Mostly calcified plaque at the right carotid bifurcation which is mild and results in less than 50 % stenosis with respect to the distal vessel. Somewhat non dominant appearing right ICA. Left carotid system: No left CCA origin stenosis despite soft and calcified plaque. Minimal soft and calcified plaque elsewhere in the left CCA. At the left carotid bifurcation there is combination soft and calcified plaque resulting in a short segment of high-grade stenosis estimated at 70 % stenosis with respect to the distal vessel (series 402, images 124n 125). Calcified plaque continues into the left ICA bulb but there is no additional cervical left ICA stenosis. Vertebral arteries: Calcified and soft plaque at the right subclavian artery origin with no hemodynamically significant stenosis. Normal right vertebral artery origin. Normal cervical right vertebral artery. No proximal left subclavian artery stenosis despite soft and calcified plaque. There is soft plaque at the left vertebral artery origin with mild or at most moderate stenosis (series 403, image 138). Mild left V2 segment calcified plaque without stenosis. The left vertebral artery is mildly non dominant. CTA HEAD  Posterior circulation: Right V4 segment calcified plaque with mild distal right vertebral artery stenosis. Patent vertebrobasilar junction. Combined soft and calcified left V4 segment plaque with similar mild stenosis. Normal left vertebrobasilar junction. Both a ICAs appear dominant. No basilar stenosis. Normal SCA and PCA origins. Diminutive posterior communicating arteries. Bilateral PCA branches are within normal limits. Anterior circulation: Both ICA siphons are patent. There is moderate to severe bilateral siphon calcified plaque worse on the right. On the left side there is moderate cavernous left ICA stenosis (series 404, images 129 in 04/1928. On the right there is moderate to severe stenosis at both the proximal and distal cavernous segments (series 402, image 239). Normal ophthalmic artery origins. Carotid termini are patent. The left A1 segment is dominant and the right A1 is diminutive or absent. Anterior communicating artery and bilateral ACA branches are within normal limits. Right MCA M1 segment, right MCA bifurcation, and right MCA branches are within normal limits. The left MCA M1 segment is patent and bifurcates early. There is a patent left anterior temporal artery branch on series 404, image 133. The left MCA bifurcation then is patent. No left MCA branch occlusion or significant stenosis is identified. Venous sinuses: Patent. Anatomic variants: Mildly dominant right vertebral artery. Mildly dominant left ICA, with dominant left and diminutive or absent right ACA A1 segments. Review of the MIP images confirms the above findings IMPRESSION: 1. Negative for emergent large vessel occlusion. This was preliminarily discussed with Dr. Ritta Slot via telephone at 1141 hours. 2. 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 3. There is also extensive ICA siphon calcified plaque with moderate left and moderate to  severe right ICA siphon  stenosis. 4. Mildly dominant right vertebral artery. Mild bilateral V4 segment vertebral artery stenosis, with mild to moderate stenosis at the origin of the left vertebral artery. 5. Left IJ approach dual lumen dialysis type catheter in place. Electronically Signed   By: Odessa Fleming M.D.   On: 05/31/2016 11:56    Procedures Procedures (including critical care time)  Medications Ordered in ED Medications  valproate (DEPACON) 2,000 mg in dextrose 5 % 50 mL IVPB (not administered)  vancomycin (VANCOCIN) 2,500 mg in sodium chloride 0.9 % 500 mL IVPB (2,500 mg Intravenous New Bag/Given 05/31/16 1234)  lidocaine (PF) (XYLOCAINE) 1 % injection 5 mL (not administered)  LORazepam (ATIVAN) 2 MG/ML injection (1 mg  Given 05/31/16 1117)  iopamidol (ISOVUE-370) 76 % injection (50 mLs  Contrast Given 05/31/16 1117)  meropenem (MERREM) 1 g in sodium chloride 0.9 % 100 mL IVPB (1 g Intravenous New Bag/Given 05/31/16 1238)     Initial Impression / Assessment and Plan / ED Course  I have reviewed the triage vital signs and the nursing notes.  Pertinent labs & imaging results that were available during my care of the patient were reviewed by me and considered in my medical decision making (see chart for details).     Patient is a 51 year old female with past history significant for seizures, lives in a nursing home presenting his code stroke. On arrival patient to be actively seizing. Ativan given. The CT shows no evidence of ischemia. Seen by neurology. Stat EEG ordered.  Of note patient recently started taking Wellbutrin in January. Patient is also on Keppra.   Patient did have one positive blood culture that was called to her. Given that patient was hypothermic concern was for sepsis, bacterial meningitis.  Fluoroscopy guided lumbar puncture completed in the emergency department.  Will admit for following up cultures and altered mental status. Continue patient continues to be altered. Post ictal vs med effect.    4:10 PM Patient became unreponsive even to painful stimuli. Maybe med effect- leading to hypercapnia and AMS.    Intubated patient. Called crit care, let neurology know.    CRITICAL CARE Performed by: Arlana Hove Total critical care time: 90 minutes Critical care time was exclusive of separately billable procedures and treating other patients. Critical care was necessary to treat or prevent imminent or life-threatening deterioration. Critical care was time spent personally by me on the following activities: development of treatment plan with patient and/or surrogate as well as nursing, discussions with consultants, evaluation of patient's response to treatment, examination of patient, obtaining history from patient or surrogate, ordering and performing treatments and interventions, ordering and review of laboratory studies, ordering and review of radiographic studies, pulse oximetry and re-evaluation of patient's condition.   Final Clinical Impressions(s) / ED Diagnoses   Final diagnoses:  Seizure [facility])    New Prescriptions New Prescriptions   No medications on file     Victoriana Aziz Randall An, MD 05/31/16 1452    Gram Siedlecki Randall An, MD 06/02/16 [phone]

## 2016-05-31 NOTE — Progress Notes (Signed)
Pt transported on vent by RT from ED to 2M12. Vitals remained stable through out.

## 2016-05-31 NOTE — ED Provider Notes (Signed)
INTUBATION Performed by: Kittie Plater  Required items: required blood products, implants, devices, and special equipment available Patient identity confirmed: provided demographic data and hospital-assigned identification number Time out: Immediately prior to procedure a "time out" was called to verify the correct patient, procedure, equipment, support staff and site/side marked as required.  Indications: obtunded  Intubation method: Glidescope Laryngoscopy   Preoxygenation: BVM  Sedatives: Etomidate Paralytic: Rocuronium  Tube Size: 7.5 cuffed  Post-procedure assessment: chest rise and ETCO2 monitor Breath sounds: equal and absent over the epigastrium Tube secured with: ETT holder Chest x-ray interpreted by radiologist and me.  Chest x-ray findings: endotracheal tube in appropriate position  Patient tolerated the procedure well with no immediate complications.       Sidney Ace, MD 06/01/16 0355    Benjiman Core, MD 06/01/16 2312

## 2016-05-31 NOTE — ED Triage Notes (Signed)
Pt to ER by [facility] from [facility] where patient was last seen normal at 9:40 this morning (baseline alert and oriented able to move all extremities and carry on a conversation), was found around 10 am with left gaze and altered mental status. On arrival to ER patient presents with left gaze and seizure activity noted to right upper extremity. Airway cleared at the bridge, given verbal order from MD Amada Jupiter to give 2 mg ativan while in CT. BP 225/110 for EMS, not on blood thinners.

## 2016-05-31 NOTE — ED Notes (Signed)
Paged [facility]

## 2016-05-31 NOTE — ED Notes (Signed)
MD would prefer rectal temperature however EEG in progress, will obtain after testing.

## 2016-06-01 ENCOUNTER — Inpatient Hospital Stay (HOSPITAL_COMMUNITY): Payer: Medicare HMO

## 2016-06-01 LAB — GLUCOSE, CAPILLARY
GLUCOSE-CAPILLARY: 107 mg/dL — AB (ref 65–99)
GLUCOSE-CAPILLARY: 108 mg/dL — AB (ref 65–99)
GLUCOSE-CAPILLARY: 116 mg/dL — AB (ref 65–99)
GLUCOSE-CAPILLARY: 101 mg/dL — AB (ref 65–99)
Glucose-Capillary: 105 mg/dL — ABNORMAL HIGH (ref 65–99)
Glucose-Capillary: 113 mg/dL — ABNORMAL HIGH (ref 65–99)
Glucose-Capillary: 126 mg/dL — ABNORMAL HIGH (ref 65–99)

## 2016-06-01 LAB — POCT I-STAT 3, ART BLOOD GAS (G3+)
Acid-Base Excess: 6 mmol/L — ABNORMAL HIGH (ref 0.0–2.0)
Bicarbonate: 28.2 mmol/L — ABNORMAL HIGH (ref 20.0–28.0)
O2 SAT: 100 %
PCO2 ART: 33.6 mmHg (ref 32.0–48.0)
Patient temperature: 98.6
TCO2: 29 mmol/L (ref 0–100)
pH, Arterial: 7.532 — ABNORMAL HIGH (ref 7.350–7.450)
pO2, Arterial: 171 mmHg — ABNORMAL HIGH (ref 83.0–108.0)

## 2016-06-01 LAB — BASIC METABOLIC PANEL
Anion gap: 14 (ref 5–15)
BUN: 39 mg/dL — AB (ref 6–20)
CHLORIDE: 94 mmol/L — AB (ref 101–111)
CO2: 25 mmol/L (ref 22–32)
Calcium: 10.1 mg/dL (ref 8.9–10.3)
Creatinine, Ser: 4.4 mg/dL — ABNORMAL HIGH (ref 0.44–1.00)
GFR calc Af Amer: 12 mL/min — ABNORMAL LOW (ref >60)
GFR calc non Af Amer: 11 mL/min — ABNORMAL LOW (ref >60)
GLUCOSE: 100 mg/dL — AB (ref 65–99)
POTASSIUM: 3.6 mmol/L (ref 3.5–5.1)
Sodium: 133 mmol/L — ABNORMAL LOW (ref 135–145)

## 2016-06-01 LAB — PHOSPHORUS: Phosphorus: 4.2 mg/dL (ref 2.5–4.6)

## 2016-06-01 LAB — CBC
HEMATOCRIT: 33.6 % — AB (ref 36.0–46.0)
Hemoglobin: 11.1 g/dL — ABNORMAL LOW (ref 12.0–15.0)
MCH: 28.5 pg (ref 26.0–34.0)
MCHC: 33 g/dL (ref 30.0–36.0)
MCV: 86.4 fL (ref 78.0–100.0)
Platelets: 236 10*3/uL (ref 150–400)
RBC: 3.89 MIL/uL (ref 3.87–5.11)
RDW: 14.4 % (ref 11.5–15.5)
WBC: 10.2 10*3/uL (ref 4.0–10.5)

## 2016-06-01 LAB — PROCALCITONIN: PROCALCITONIN: 1.05 ng/mL

## 2016-06-01 LAB — MAGNESIUM: Magnesium: 2.1 mg/dL (ref 1.7–2.4)

## 2016-06-01 LAB — HIV ANTIBODY (ROUTINE TESTING W REFLEX): HIV SCREEN 4TH GENERATION: NONREACTIVE

## 2016-06-01 LAB — CK: CK TOTAL: 24 U/L — AB (ref 38–234)

## 2016-06-01 LAB — AMMONIA

## 2016-06-01 LAB — TSH: TSH: 2.417 u[IU]/mL (ref 0.350–4.500)

## 2016-06-01 MED ORDER — LISINOPRIL 10 MG PO TABS
10.0000 mg | ORAL_TABLET | Freq: Every day | ORAL | Status: DC
  Administered 2016-06-01 – 2016-06-02 (×2): 10 mg
  Filled 2016-06-01 (×3): qty 1

## 2016-06-01 MED ORDER — SODIUM CHLORIDE 0.9 % IV SOLN: 100.0000 mL | INTRAVENOUS | Status: DC | PRN

## 2016-06-01 MED ORDER — ALBUMIN HUMAN 5 % IV SOLN
12.5000 g | Freq: Once | INTRAVENOUS | Status: AC
  Administered 2016-06-01: 12.5 g via INTRAVENOUS

## 2016-06-01 MED ORDER — METOPROLOL TARTRATE 25 MG/10 ML ORAL SUSPENSION
50.0000 mg | Freq: Two times a day (BID) | ORAL | Status: DC
  Administered 2016-06-01 – 2016-06-03 (×5): 50 mg
  Filled 2016-06-01 (×5): qty 20

## 2016-06-01 MED ORDER — AMLODIPINE 1 MG/ML ORAL SUSPENSION
10.0000 mg | Freq: Every day | ORAL | Status: DC
  Administered 2016-06-01: 10 mg
  Filled 2016-06-01 (×4): qty 10

## 2016-06-01 MED ORDER — PENTAFLUOROPROP-TETRAFLUOROETH EX AERO
1.0000 | INHALATION_SPRAY | CUTANEOUS | Status: DC | PRN
Start: 2016-06-01 — End: 2016-06-03

## 2016-06-01 MED ORDER — SODIUM CHLORIDE 0.9 % IV SOLN
1000.0000 mg | Freq: Every day | INTRAVENOUS | Status: DC
  Administered 2016-06-02 – 2016-06-05 (×4): 1000 mg via INTRAVENOUS
  Filled 2016-06-01 (×4): qty 10

## 2016-06-01 MED ORDER — CHLORHEXIDINE GLUCONATE CLOTH 2 % EX PADS
6.0000 | MEDICATED_PAD | Freq: Every morning | CUTANEOUS | Status: DC
  Administered 2016-06-01 – 2016-06-06 (×6): 6 via TOPICAL

## 2016-06-01 MED ORDER — PANTOPRAZOLE SODIUM 40 MG PO PACK
40.0000 mg | PACK | Freq: Every day | ORAL | Status: DC
  Administered 2016-06-01 – 2016-06-02 (×2): 40 mg
  Filled 2016-06-01 (×2): qty 20

## 2016-06-01 MED ORDER — HEPARIN SODIUM (PORCINE) 1000 UNIT/ML DIALYSIS: 1000.0000 [IU] | INTRAMUSCULAR | Status: DC | PRN

## 2016-06-01 MED ORDER — LEVETIRACETAM 500 MG PO TABS
500.0000 mg | ORAL_TABLET | ORAL | Status: DC
  Filled 2016-06-01: qty 1

## 2016-06-01 MED ORDER — LIDOCAINE HCL (PF) 1 % IJ SOLN: 5.0000 mL | INTRAMUSCULAR | Status: DC | PRN

## 2016-06-01 MED ORDER — ALTEPLASE 2 MG IJ SOLR
2.0000 mg | Freq: Once | INTRAMUSCULAR | Status: DC | PRN
  Filled 2016-06-01: qty 2

## 2016-06-01 MED ORDER — LEVETIRACETAM 100 MG/ML PO SOLN
500.0000 mg | ORAL | Status: DC
  Administered 2016-06-01: 500 mg
  Filled 2016-06-01: qty 5

## 2016-06-01 MED ORDER — HYDRALAZINE HCL 20 MG/ML IJ SOLN
10.0000 mg | INTRAMUSCULAR | Status: DC | PRN
  Administered 2016-06-01 – 2016-06-02 (×4): 20 mg via INTRAVENOUS
  Administered 2016-06-02: 10 mg via INTRAVENOUS
  Administered 2016-06-03 – 2016-06-05 (×6): 20 mg via INTRAVENOUS
  Filled 2016-06-01 (×10): qty 1
  Filled 2016-06-01: qty 2

## 2016-06-01 MED ORDER — ALBUMIN HUMAN 5 % IV SOLN
INTRAVENOUS | Status: AC
  Filled 2016-06-01: qty 250

## 2016-06-01 MED ORDER — LIDOCAINE-PRILOCAINE 2.5-2.5 % EX CREA
1.0000 | TOPICAL_CREAM | CUTANEOUS | Status: DC | PRN
Start: 2016-06-01 — End: 2016-06-03

## 2016-06-01 NOTE — Progress Notes (Signed)
HD done today, 4100 L removed. Upon assessment Pt withdrawing from the pain. MD aware. It is better from prior. Twitching noted as well. Continuous EEG.  Fentanyl DC. wasted in the sink. Roselie Awkward, RN witnessed.

## 2016-06-01 NOTE — Progress Notes (Signed)
Initial Nutrition Assessment  DOCUMENTATION CODES:   Obesity unspecified  INTERVENTION:  - If pt to remain intubated >/= 24 hours and TF warranted, recommend Vital High Protein @ 50 mL/hr with 30 mL Prostat BID. This regimen provides 1400 kcal, 135 grams of protein, and 1003 mL free water.  - Will monitor renal panel closely when/if TF initiated.   NUTRITION DIAGNOSIS:   Inadequate oral intake related to inability to eat as evidenced by NPO status.  GOAL:   Provide needs based on ASPEN/SCCM guidelines  MONITOR:   Vent status, Weight trends, Labs, I & O's  REASON FOR ASSESSMENT:   Ventilator  ASSESSMENT:   51 year old female with PMH of seizure (on keppra), ESRD-HD, DM and CHF who presents to the ED with seizure and AMS.  Patient was brought to the ED where she had another seizure, given ativan and seizure activity was under control.  Patient recently had a recent positive blood culture (coag negative staff) that was felt to be a contaminant but with stiff neck and fever concern was for meningitis and LP was performed that was reportedly clean.  Patient was intubated for inability to protect her airway and PCCM was called to admit.  Neuro seeing patient.  Reportedly prior to patient loosing her mental status again and SBP became 240.  Patient was then intubated.  Pt seen for new vent. BMI indicates obesity. Pt intubated with OGT and is currently on in-room HD; on Tuesday, Thursday, Saturday schedule PTA. Daughter at bedside at time of RD visit. She states that pt has a very good appetite at home, is able to self feed, and has no difficulty with chewing or swallowing any items. Daughter provides pt with snacks and meals at least 3x/week. Daughter reports that pt's weight fluctuates and clothes are sometimes looser or tighter but that this is d/t fluid changes with HD. Daughter does not feel that pt has lost any significant weight (i.e., lean body mass) recently. Per chart review, weight  3/2 was 241 lbs which is consistent with trends since December, but weight today recorded as 219 lbs; will monitor closely. Physical assessment shows no muscle or fat wasting, moderate edema to BLE. Notes indicate pt admitted under Code Stroke and lumbar puncture was performed in the ED.  Patient is currently intubated on ventilator support MV: 8.6 L/min Temp (24hrs), Avg:98.4 F (36.9 C), Min:96.4 F (35.8 C), Max:99.9 F (37.7 C) Propofol: none BP: 155/62 and MAP: 88  Medications reviewed; 12.5 g IV albumin x2 doses today while receiving HD, sliding scale Novolog, 40 mg IV Protonix/day.  Labs reviewed; CBGs: 105 and 107 mg/dL this AM, Na: 671 mmol/L, Cl: 94 mmol/L, BUN: 39 mg/dL, creatinine: 4.4 mg/dL, GFR: 11 mL/min.     Diet Order:  Diet NPO time specified  Skin:  Reviewed, no issues  Last BM:  PTA/unknown  Height:   Ht Readings from Last 1 Encounters:  05/31/16 5\' 9"  (1.753 m)    Weight:   Wt Readings from Last 1 Encounters:  06/01/16 219 lb 6.4 oz (99.5 kg)    Ideal Body Weight:  65.91 kg  BMI:  Body mass index is 32.4 kg/m.  Estimated Nutritional Needs:   Kcal:  2458-0998 (11-14 kcal/kg actual body weight)  Protein:  132 grams (2 grams/kg IBW)  Fluid:  per MD/NP/Nephrology given HD pt with slight hyponatremia  EDUCATION NEEDS:   No education needs identified at this time    Trenton Gammon, MS, RD, LDN, CNSC Inpatient  Clinical Dietitian Pager # [phone] After hours/weekend pager # [phone]

## 2016-06-01 NOTE — Progress Notes (Signed)
vLTM EEG running. Tested event button. Educated Engineer, civil (consulting) and family member. Notified Neuro

## 2016-06-01 NOTE — Progress Notes (Signed)
Subjective:  BP controlled on clevidipine- had MRI- nothing acute but many remote infarcts and atrophy - due for HD this AM Objective Vital signs in last 24 hours: Vitals:   06/01/16 0515 06/01/16 0530 06/01/16 0545 06/01/16 0600  BP: (!) 134/47 (!) 130/45 (!) 130/44 (!) 125/50  Pulse: 68 69 68 67  Resp: _0 Temp:      TempSrc:      SpO2: 100% 100% 100% 100%  Weight:      Height:       Weight change:   Intake/Output Summary (Last 24 hours) at 06/01/16 0640 Last data filed at 06/01/16 0600  Gross per 24 hour  Intake           455.73 ml  Output              480 ml  Net           -24.27 ml    Dialyzes at [facility] on [address]  EDW 108. 4 hours- usually needs 4-5 L removed per treatment HD Bath 2K, Dialyzer unknown, Heparin yes, 2000 bolus and 900/h. Access PC- 400 blood flow rate. TTS Epogen 6003 times a week, Iron 100 2 times a week   Assessment/Plan: 51 year old with multiple medical issues including ESRD and seizure disorder. Presenting with decreased mental status as well as seizure 1 seizure/decreased mental status- patient has a history of seizure disorder. Neurology has been consulted. Lumbar puncture was without evidence of meningitis. Could this possibly be hypertensive encephalopathy? Supportive care and antiseizure medications. HCT negative times 2, MRI negative as well for acute change- on keppra.  Also getting abx- follow clinically  2 ESRD: Normally TTS with [facility]. We will plan for dialysis today via PC. 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  Supposedly on amlodipine, lisinopril, Lopressor, and hydralazine but again with a history of noncompliance.  Using cleviprex right now but dialysis will help bring down as well 4. Anemia of ESRD: Does not appear to be a significant issue at this time 5. Metabolic Bone Disease: Is normally on Renvela but no vitamin  D noted. Will resume Renvela once extubated    Saket Hellstrom A    Labs: Basic Metabolic Panel:  Recent Labs Lab 05/31/16 1030 05/31/16 1102 06/01/16 0226  NA 132* 132* 133*  K 4.1 3.9 3.6  CL 96* 96* 94*  CO2 24  --  25  GLUCOSE 124* 127* 100*  BUN 33* 36* 39*  CREATININE 4.01* 4.00* 4.40*  CALCIUM 9.7  --  10.1  PHOS  --   --  4.2   Liver Function Tests:  Recent Labs Lab 05/31/16 1030  AST 15  ALT 11*  ALKPHOS 66  BILITOT 0.5  PROT 6.2*  ALBUMIN 3.1*   No results for input(s): LIPASE, AMYLASE in the last 168 hours. No results for input(s): AMMONIA in the last 168 hours. CBC:  Recent Labs Lab 05/31/16 1030 05/31/16 1102 06/01/16 0226  WBC 6.4  --  10.2  NEUTROABS 4.2  --   --   HGB 12.0 12.6 11.1*  HCT 37.6 37.0 33.6*  MCV 88.7  --  86.4  PLT 195  --  236   Cardiac Enzymes: No results for input(s): CKTOTAL, CKMB, CKMBINDEX, TROPONINI in the last 168 hours. CBG:  Recent Labs Lab 05/31/16 1056 05/31/16 1751 05/31/16 2055 06/01/16 0017 06/01/16 0422  GLUCAP  125* 107* 101* 105* 107*    Iron Studies: No results for input(s): IRON, TIBC, TRANSFERRIN, FERRITIN in the last 72 hours. Studies/Results: Ct Angio Head W Or Wo Contrast  Result Date: 05/31/2016 CLINICAL DATA:  51 year old female code stroke. Right side weakness. Initial encounter. EXAM: CT ANGIOGRAPHY HEAD AND NECK TECHNIQUE: Multidetector CT imaging of the head and neck was performed using the standard protocol during bolus administration of intravenous contrast. Multiplanar CT image reconstructions and MIPs were obtained to evaluate the vascular anatomy. Carotid stenosis measurements (when applicable) are obtained utilizing NASCET criteria, using the distal internal carotid diameter as the denominator. CONTRAST:  50 mL Isovue 370 COMPARISON:  Noncontrast head CT 1102 hours today. FINDINGS: CTA NECK Skeleton: No acute osseous abnormality identified. Mild for age spine degeneration. Upper  chest: Negative lung apices. No superior mediastinal lymphadenopathy. There is a left internal jugular approach dual lumen dialysis type catheter in place. The visible SVC is patent. Other neck: Subcentimeter left thyroid lobe and isthmus nodules which do not meet size criteria for ultrasound follow-up. Negative larynx, pharynx, parapharyngeal spaces, retropharyngeal space, sublingual space, submandibular glands and parotid glands. No lymphadenopathy. Aortic arch: 3 vessel arch configuration. Mild arch and proximal great vessels soft and calcified atherosclerotic plaque. Right carotid system: No brachiocephalic artery or right CCA origin stenosis despite some plaque. Mild circumferential soft plaque in the right CCA. Mostly calcified plaque at the right carotid bifurcation which is mild and results in less than 50 % stenosis with respect to the distal vessel. Somewhat non dominant appearing right ICA. Left carotid system: No left CCA origin stenosis despite soft and calcified plaque. Minimal soft and calcified plaque elsewhere in the left CCA. At the left carotid bifurcation there is combination soft and calcified plaque resulting in a short segment of high-grade stenosis estimated at 70 % stenosis with respect to the distal vessel (series 402, images 124n 125). Calcified plaque continues into the left ICA bulb but there is no additional cervical left ICA stenosis. Vertebral arteries: Calcified and soft plaque at the right subclavian artery origin with no hemodynamically significant stenosis. Normal right vertebral artery origin. Normal cervical right vertebral artery. No proximal left subclavian artery stenosis despite soft and calcified plaque. There is soft plaque at the left vertebral artery origin with mild or at most moderate stenosis (series 403, image 138). Mild left V2 segment calcified plaque without stenosis. The left vertebral artery is mildly non dominant. CTA HEAD Posterior circulation: Right V4  segment calcified plaque with mild distal right vertebral artery stenosis. Patent vertebrobasilar junction. Combined soft and calcified left V4 segment plaque with similar mild stenosis. Normal left vertebrobasilar junction. Both a ICAs appear dominant. No basilar stenosis. Normal SCA and PCA origins. Diminutive posterior communicating arteries. Bilateral PCA branches are within normal limits. Anterior circulation: Both ICA siphons are patent. There is moderate to severe bilateral siphon calcified plaque worse on the right. On the left side there is moderate cavernous left ICA stenosis (series 404, images 129 in 04/1928. On the right there is moderate to severe stenosis at both the proximal and distal cavernous segments (series 402, image 239). Normal ophthalmic artery origins. Carotid termini are patent. The left A1 segment is dominant and the right A1 is diminutive or absent. Anterior communicating artery and bilateral ACA branches are within normal limits. Right MCA M1 segment, right MCA bifurcation, and right MCA branches are within normal limits. The left MCA M1 segment is patent and bifurcates early. There is a patent left anterior  temporal artery branch on series 404, image 133. The left MCA bifurcation then is patent. No left MCA branch occlusion or significant stenosis is identified. Venous sinuses: Patent. Anatomic variants: Mildly dominant right vertebral artery. Mildly dominant left ICA, with dominant left and diminutive or absent right ACA A1 segments. Review of the MIP images confirms the above findings IMPRESSION: 1. Negative for emergent large vessel occlusion. This was preliminarily discussed with Dr. Roland Rack via telephone at 1141 hours. 2. 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 3. There is also extensive ICA siphon calcified plaque with moderate left and moderate to severe right ICA siphon stenosis. 4. Mildly dominant right  vertebral artery. Mild bilateral V4 segment vertebral artery stenosis, with mild to moderate stenosis at the origin of the left vertebral artery. 5. Left IJ approach dual lumen dialysis type catheter in place. Electronically Signed   By: Genevie Ann M.D.   On: 05/31/2016 11:56   Ct Head Wo Contrast  Result Date: 05/31/2016 CLINICAL DATA:  51 year old female admitted with altered mental status, initially thought to be code stroke. Subsequent positive blood cultures. Status post fluoroscopic guided lumbar puncture at 1255 hours today. Acutely decompensated with hypertension and bradycardia. Now intubated. Initial encounter. EXAM: CT HEAD WITHOUT CONTRAST TECHNIQUE: Contiguous axial images were obtained from the base of the skull through the vertex without intravenous contrast. COMPARISON:  Cta head and neck 1123 hours today and head CT without contrast 1102 hours today. FINDINGS: Brain: No acute intracranial hemorrhage identified. No midline shift, mass effect, or evidence of intracranial mass lesion. No ventriculomegaly. Stable gray-white matter differentiation throughout the brain. No cortically based acute infarct identified. Vascular: Calcified atherosclerosis at the skull base. Skull: No acute osseous abnormality identified. Sinuses/Orbits: New left maxillary sinus fluid level and mucosal thickening. The nasal cavity and nasopharynx now all are opacified. Heterogeneous gas at the anterior nasal septum appears to occupy an area of chronic septal erosion. Other Visualized paranasal sinuses and mastoids are stable and well pneumatized. Other: No scalp hematoma. Orbits soft tissues appear stable. Aside from the pharynx opacification negative visualized noncontrast deep soft tissue spaces of the face. IMPRESSION: 1. Stable CT appearance of the brain since 1102 hours today. No acute intracranial abnormality. 2. Fluid now throughout the nasal cavity and visible pharynx in the setting of intubation. Electronically Signed    By: Genevie Ann M.D.   On: 05/31/2016 16:37   Ct Head Wo Contrast  Result Date: 05/31/2016 CLINICAL DATA:  51 year old female with right side weakness. Code stroke. Initial encounter. EXAM: CT HEAD WITHOUT CONTRAST TECHNIQUE: Contiguous axial images were obtained from the base of the skull through the vertex without intravenous contrast. COMPARISON:  Head CT 02/25/2016 FINDINGS: Brain: Gray-white matter differentiation appears stable and within normal limits throughout the brain. No cortically based acute infarct identified. No acute intracranial hemorrhage identified. No midline shift, mass effect, or evidence of intracranial mass lesion. No ventriculomegaly. Vascular: Calcified atherosclerosis at the skull base. No suspicious intracranial vascular hyperdensity. Skull: No acute osseous abnormality identified. Sinuses/Orbits: Mild mucosal thickening in the posterior left maxillary sinus. Other Visualized paranasal sinuses and mastoids are stable and well pneumatized. Other: Orbit and scalp soft tissues appear stable and negative. ASPECTS score = 10 [place] Stroke Program Early CT Score Normal score = 10 IMPRESSION: 1. Stable and normal Normal noncontrast CT appearance of the brain. 2. ASPECTS 10. 3. The above was relayed via text pager to Dr. Roland Rack on 05/31/2016  at 11:09 . Electronically Signed   By: Genevie Ann M.D.   On: 05/31/2016 11:09   Ct Angio Neck W Or Wo Contrast  Result Date: 05/31/2016 CLINICAL DATA:  51 year old female code stroke. Right side weakness. Initial encounter. EXAM: CT ANGIOGRAPHY HEAD AND NECK TECHNIQUE: Multidetector CT imaging of the head and neck was performed using the standard protocol during bolus administration of intravenous contrast. Multiplanar CT image reconstructions and MIPs were obtained to evaluate the vascular anatomy. Carotid stenosis measurements (when applicable) are obtained utilizing NASCET criteria, using the distal internal carotid diameter as the  denominator. CONTRAST:  50 mL Isovue 370 COMPARISON:  Noncontrast head CT 1102 hours today. FINDINGS: CTA NECK Skeleton: No acute osseous abnormality identified. Mild for age spine degeneration. Upper chest: Negative lung apices. No superior mediastinal lymphadenopathy. There is a left internal jugular approach dual lumen dialysis type catheter in place. The visible SVC is patent. Other neck: Subcentimeter left thyroid lobe and isthmus nodules which do not meet size criteria for ultrasound follow-up. Negative larynx, pharynx, parapharyngeal spaces, retropharyngeal space, sublingual space, submandibular glands and parotid glands. No lymphadenopathy. Aortic arch: 3 vessel arch configuration. Mild arch and proximal great vessels soft and calcified atherosclerotic plaque. Right carotid system: No brachiocephalic artery or right CCA origin stenosis despite some plaque. Mild circumferential soft plaque in the right CCA. Mostly calcified plaque at the right carotid bifurcation which is mild and results in less than 50 % stenosis with respect to the distal vessel. Somewhat non dominant appearing right ICA. Left carotid system: No left CCA origin stenosis despite soft and calcified plaque. Minimal soft and calcified plaque elsewhere in the left CCA. At the left carotid bifurcation there is combination soft and calcified plaque resulting in a short segment of high-grade stenosis estimated at 70 % stenosis with respect to the distal vessel (series 402, images 124n 125). Calcified plaque continues into the left ICA bulb but there is no additional cervical left ICA stenosis. Vertebral arteries: Calcified and soft plaque at the right subclavian artery origin with no hemodynamically significant stenosis. Normal right vertebral artery origin. Normal cervical right vertebral artery. No proximal left subclavian artery stenosis despite soft and calcified plaque. There is soft plaque at the left vertebral artery origin with mild or at  most moderate stenosis (series 403, image 138). Mild left V2 segment calcified plaque without stenosis. The left vertebral artery is mildly non dominant. CTA HEAD Posterior circulation: Right V4 segment calcified plaque with mild distal right vertebral artery stenosis. Patent vertebrobasilar junction. Combined soft and calcified left V4 segment plaque with similar mild stenosis. Normal left vertebrobasilar junction. Both a ICAs appear dominant. No basilar stenosis. Normal SCA and PCA origins. Diminutive posterior communicating arteries. Bilateral PCA branches are within normal limits. Anterior circulation: Both ICA siphons are patent. There is moderate to severe bilateral siphon calcified plaque worse on the right. On the left side there is moderate cavernous left ICA stenosis (series 404, images 129 in 04/1928. On the right there is moderate to severe stenosis at both the proximal and distal cavernous segments (series 402, image 239). Normal ophthalmic artery origins. Carotid termini are patent. The left A1 segment is dominant and the right A1 is diminutive or absent. Anterior communicating artery and bilateral ACA branches are within normal limits. Right MCA M1 segment, right MCA bifurcation, and right MCA branches are within normal limits. The left MCA M1 segment is patent and bifurcates early. There is a patent left anterior temporal artery branch on series  404, image 133. The left MCA bifurcation then is patent. No left MCA branch occlusion or significant stenosis is identified. Venous sinuses: Patent. Anatomic variants: Mildly dominant right vertebral artery. Mildly dominant left ICA, with dominant left and diminutive or absent right ACA A1 segments. Review of the MIP images confirms the above findings IMPRESSION: 1. Negative for emergent large vessel occlusion. This was preliminarily discussed with Dr. Roland Rack via telephone at 1141 hours. 2. Positive for high-grade atherosclerotic stenosis at the  left ICA origin, numerically estimated at 70%. 3. There is also extensive ICA siphon calcified plaque with moderate left and moderate to severe right ICA siphon stenosis. 4. Mildly dominant right vertebral artery. Mild bilateral V4 segment vertebral artery stenosis, with mild to moderate stenosis at the origin of the left vertebral artery. 5. Left IJ approach dual lumen dialysis type catheter in place. Electronically Signed   By: Genevie Ann M.D.   On: 05/31/2016 11:56   Mr Brain Wo Contrast  Result Date: 05/31/2016 CLINICAL DATA:  Acute onset of altered mental status. Seizure-like activity. EXAM: MRI HEAD WITHOUT CONTRAST TECHNIQUE: Multiplanar, multiecho pulse sequences of the brain and surrounding structures were obtained without intravenous contrast. COMPARISON:  CT head without contrast 05/31/2016 FINDINGS: Brain: The diffusion-weighted images demonstrate no evidence for acute or subacute infarction. Mild age advanced atrophy and white matter changes are present bilaterally. T2 changes are present in the brainstem. A remote infarct is evident within the left middle cerebellar peduncle. Additional remote lacunar infarcts are present in the left thalamus. A remote punctate lacunar infarct is present in the inferior right cerebellum. Dedicated imaging of the temporal lobes demonstrate symmetric size and signal of the hippocampal structures. Vascular: Flow is present in the major intracranial arteries. Skull and upper cervical spine: The patient is intubated. Skullbase is otherwise within normal limits. Fluid is present in the nasopharynx. Craniocervical junction is normal. Midline sagittal structures are unremarkable. Sinuses/Orbits: A fluid level is present in the left maxillary sinus. There is diffuse mucosal thickening in the ethmoid air cells, sphenoid sinuses, and right maxillary sinus. The frontal sinuses are clear bilaterally. Minimal mastoid fluid is present bilaterally. IMPRESSION: 1. No acute intracranial  abnormality to explain 7 not altered mental status. 2. Remote lacunar infarcts within the posterior circulation as described. 3. Mild generalized atrophy and white matter disease is advanced for age. Electronically Signed   By: San Morelle M.D.   On: 05/31/2016 20:29   Dg Chest Portable 1 View  Result Date: 05/31/2016 CLINICAL DATA:  Post intubation EXAM: PORTABLE CHEST 1 VIEW COMPARISON:  05/18/2016 FINDINGS: Cardiomegaly again noted. Double lumen left IJ catheter is unchanged in position. There is endotracheal tube in place with tip 2.7 cm above the carina. NG tube in place. The tip of the NG tube is not included on the film. Central mild vascular congestion without convincing pulmonary edema. Mild right basilar atelectasis. No segmental infiltrate. IMPRESSION: Double lumen left IJ catheter is unchanged in position. There is endotracheal tube in place with tip 2.7 cm above the carina. NG tube in place. The tip of the NG tube is not included on the film. Central mild vascular congestion without convincing pulmonary edema. Mild right basilar atelectasis. No segmental infiltrate. Electronically Signed   By: Lahoma Crocker M.D.   On: 05/31/2016 15:57   Dg Fluoro Guide Lumbar Puncture  Result Date: 05/31/2016 CLINICAL DATA:  51 year old female with altered mental status, right side weakness. Code stroke workup thus far today is negative for emergent  large vessel occlusion or CT evidence of cortically based infarct. Blood cultures are now positive for gram positive cocci. Diagnostic lumbar puncture requested. Initial encounter. EXAM: DIAGNOSTIC LUMBAR PUNCTURE UNDER FLUOROSCOPIC GUIDANCE FLUOROSCOPY TIME:  Fluoroscopy Time:  0 minutes 6 seconds Radiation Exposure Index (if provided by the fluoroscopic device): Number of Acquired Spot Images: 0 PROCEDURE: Informed consent was obtained from the patient's daughter prior to the procedure, including potential complications of headache, allergy, and pain. A  "time-out" was performed. With the patient prone, the lower back was prepped with Betadine. 1% Lidocaine was used for local anesthesia. Lumbar puncture was performed at the L3-L4 level using left sub laminar technique and a 3.5 inch x 20 gauge needle with return of clear CSF with an opening pressure of 18 cm of water. 16 mL of CSF were obtained for laboratory studies. The patient tolerated the procedure well and there were no apparent complications. IMPRESSION: Diagnostic lumbar puncture at L3-L4. Clear CSF with opening pressure of 18 cm of water. 16 mL of CSF obtained for laboratory studies. Electronically Signed   By: Genevie Ann M.D.   On: 05/31/2016 13:50   Medications: Infusions: . clevidipine 4 mg/hr (06/01/16 0300)  . fentaNYL infusion INTRAVENOUS 25 mcg/hr (06/01/16 0545)    Scheduled Medications: . chlorhexidine gluconate (MEDLINE KIT)  15 mL Mouth Rinse BID  . Chlorhexidine Gluconate Cloth  6 each Topical q morning - 10a  . heparin  5,000 Units Subcutaneous Q8H  . insulin aspart  0-20 Units Subcutaneous Q4H  . levETIRAcetam  500 mg Intravenous Q12H  . mouth rinse  15 mL Mouth Rinse 10 times per day  . pantoprazole (PROTONIX) IV  40 mg Intravenous QHS    have reviewed scheduled and prn medications.  Physical Exam: General: sedated on vent Heart: RRR Lungs: CBS bilat Abdomen: obese, soft Extremities: pitting edema Dialysis Access: left sided PC     06/01/2016,6:40 AM  LOS: 1 day

## 2016-06-01 NOTE — Progress Notes (Signed)
PULMONARY / CRITICAL CARE MEDICINE   Name: Kimberly Montgomery MRN: [id number] DOB: [birth year]    ADMISSION DATE:  05/31/2016 CONSULTATION DATE:  05/31/2016  REFERRING MD:  EDP - [place]uen  CHIEF COMPLAINT:  AMS and seizure  HISTORY OF PRESENT ILLNESS:   51 year old female with PMH of seizure (on keppra), ESRD-HD, DM and CHF who presents to the ED with seizure and AMS.  Patient was brought to the ED where she had another seizure, given ativan and seizure activity was under control.  Patient recently had a recent positive blood culture (coag negative staff) that was felt to be a contaminant but with stiff neck and fever concern was for meningitis and LP was performed that was reportedly clean.  Patient was intubated for inability to protect her airway and PCCM was called to admit.  Neuro seeing patient.  Reportedly prior to patient losing her mental status again and SBP became 240.  Patient was then intubated.    SUBJECTIVE:  Sedated and intubated BP improved just finished HD  VITAL SIGNS: BP 139/71 (BP Location: Right Arm)   Pulse 77   Temp 99.9 F (37.7 C) (Oral)   Resp 16   Ht 5\' 9"  (1.753 m)   Wt 219 lb 6.4 oz (99.5 kg)   LMP 03/16/2015   SpO2 100%   BMI 32.40 kg/m   HEMODYNAMICS:    VENTILATOR SETTINGS: Vent Mode: PRVC FiO2 (%):  [40 %-100 %] 40 % Set Rate:  [16 bmp-20 bmp] 16 bmp Vt Set:  [530 mL] 530 mL PEEP:  [5 cmH20] 5 cmH20 Plateau Pressure:  [19 cmH20-26 cmH20] 19 cmH20  INTAKE / OUTPUT: I/O last 3 completed shifts: In: 526.9 [I.V.:316.9; IV Piggyback:210] Out: 480 [Urine:230; Emesis/NG output:250]  PHYSICAL EXAMINATION: General:  Chronically ill appearing female, recently given rocuronium so unable to assess. Neuro:  Comatose, some grimace to DPS HEENT:  /AT, pupils 3mm BERTL Cardiovascular:  RRR, Nl S1/S2, -M/R/G Lungs:  CTA bilaterally  Abdomen:  Soft, NT, ND and +BS. Musculoskeletal:  -edema and -tenderness.  R BKA Skin:   Intact  LABS:  BMET  Recent Labs Lab 05/31/16 1030 05/31/16 1102 06/01/16 0226  NA 132* 132* 133*  K 4.1 3.9 3.6  CL 96* 96* 94*  CO2 24  --  25  BUN 33* 36* 39*  CREATININE 4.01* 4.00* 4.40*  GLUCOSE 124* 127* 100*    Electrolytes  Recent Labs Lab 05/31/16 1030 06/01/16 0226  CALCIUM 9.7 10.1  MG  --  2.1  PHOS  --  4.2    CBC  Recent Labs Lab 05/31/16 1030 05/31/16 1102 06/01/16 0226  WBC 6.4  --  10.2  HGB 12.0 12.6 11.1*  HCT 37.6 37.0 33.6*  PLT 195  --  236    Coag's  Recent Labs Lab 05/31/16 1030  APTT 31  INR 0.99    Sepsis Markers  Recent Labs Lab 05/31/16 1658 06/01/16 0226  PROCALCITON 1.09 1.05    ABG  Recent Labs Lab 05/31/16 1750 06/01/16 0403  PHART 7.359 7.532*  PCO2ART 48.7* 33.6  PO2ART 437* 171.0*    Liver Enzymes  Recent Labs Lab 05/31/16 1030  AST 15  ALT 11*  ALKPHOS 66  BILITOT 0.5  ALBUMIN 3.1*    Cardiac Enzymes No results for input(s): TROPONINI, PROBNP in the last 168 hours.  Glucose  Recent Labs Lab 05/31/16 1056 05/31/16 1751 05/31/16 2055 06/01/16 0017 06/01/16 0422 06/01/16 0842  GLUCAP 125* 107* 101* 105* 107*  108*    Imaging Ct Head Wo Contrast  Result Date: 05/31/2016 CLINICAL DATA:  51 year old female admitted with altered mental status, initially thought to be code stroke. Subsequent positive blood cultures. Status post fluoroscopic guided lumbar puncture at 1255 hours today. Acutely decompensated with hypertension and bradycardia. Now intubated. Initial encounter. EXAM: CT HEAD WITHOUT CONTRAST TECHNIQUE: Contiguous axial images were obtained from the base of the skull through the vertex without intravenous contrast. COMPARISON:  Cta head and neck 1123 hours today and head CT without contrast 1102 hours today. FINDINGS: Brain: No acute intracranial hemorrhage identified. No midline shift, mass effect, or evidence of intracranial mass lesion. No ventriculomegaly. Stable  gray-white matter differentiation throughout the brain. No cortically based acute infarct identified. Vascular: Calcified atherosclerosis at the skull base. Skull: No acute osseous abnormality identified. Sinuses/Orbits: New left maxillary sinus fluid level and mucosal thickening. The nasal cavity and nasopharynx now all are opacified. Heterogeneous gas at the anterior nasal septum appears to occupy an area of chronic septal erosion. Other Visualized paranasal sinuses and mastoids are stable and well pneumatized. Other: No scalp hematoma. Orbits soft tissues appear stable. Aside from the pharynx opacification negative visualized noncontrast deep soft tissue spaces of the face. IMPRESSION: 1. Stable CT appearance of the brain since 1102 hours today. No acute intracranial abnormality. 2. Fluid now throughout the nasal cavity and visible pharynx in the setting of intubation. Electronically Signed   By: Odessa Fleming M.D.   On: 05/31/2016 16:37   Mr Brain Wo Contrast  Result Date: 05/31/2016 CLINICAL DATA:  Acute onset of altered mental status. Seizure-like activity. EXAM: MRI HEAD WITHOUT CONTRAST TECHNIQUE: Multiplanar, multiecho pulse sequences of the brain and surrounding structures were obtained without intravenous contrast. COMPARISON:  CT head without contrast 05/31/2016 FINDINGS: Brain: The diffusion-weighted images demonstrate no evidence for acute or subacute infarction. Mild age advanced atrophy and white matter changes are present bilaterally. T2 changes are present in the brainstem. A remote infarct is evident within the left middle cerebellar peduncle. Additional remote lacunar infarcts are present in the left thalamus. A remote punctate lacunar infarct is present in the inferior right cerebellum. Dedicated imaging of the temporal lobes demonstrate symmetric size and signal of the hippocampal structures. Vascular: Flow is present in the major intracranial arteries. Skull and upper cervical spine: The patient  is intubated. Skullbase is otherwise within normal limits. Fluid is present in the nasopharynx. Craniocervical junction is normal. Midline sagittal structures are unremarkable. Sinuses/Orbits: A fluid level is present in the left maxillary sinus. There is diffuse mucosal thickening in the ethmoid air cells, sphenoid sinuses, and right maxillary sinus. The frontal sinuses are clear bilaterally. Minimal mastoid fluid is present bilaterally. IMPRESSION: 1. No acute intracranial abnormality to explain 7 not altered mental status. 2. Remote lacunar infarcts within the posterior circulation as described. 3. Mild generalized atrophy and white matter disease is advanced for age. Electronically Signed   By: Marin Roberts M.D.   On: 05/31/2016 20:29   Dg Chest Port 1 View  Result Date: 06/01/2016 CLINICAL DATA:  Respiratory failure. EXAM: PORTABLE CHEST 1 VIEW COMPARISON:  Radiographs of May 31, 2016. FINDINGS: Endotracheal and nasogastric tubes are unchanged in position. Left internal jugular dialysis catheter is unchanged. No pneumothorax or pleural effusion is noted. Mild bibasilar subsegmental atelectasis is noted. Bony thorax is unremarkable. IMPRESSION: Mild bibasilar subsegmental atelectasis.  Stable support apparatus. Electronically Signed   By: Lupita Raider, M.D.   On: 06/01/2016 08:29   Dg Chest  Portable 1 View  Result Date: 05/31/2016 CLINICAL DATA:  Post intubation EXAM: PORTABLE CHEST 1 VIEW COMPARISON:  05/18/2016 FINDINGS: Cardiomegaly again noted. Double lumen left IJ catheter is unchanged in position. There is endotracheal tube in place with tip 2.7 cm above the carina. NG tube in place. The tip of the NG tube is not included on the film. Central mild vascular congestion without convincing pulmonary edema. Mild right basilar atelectasis. No segmental infiltrate. IMPRESSION: Double lumen left IJ catheter is unchanged in position. There is endotracheal tube in place with tip 2.7 cm above the  carina. NG tube in place. The tip of the NG tube is not included on the film. Central mild vascular congestion without convincing pulmonary edema. Mild right basilar atelectasis. No segmental infiltrate. Electronically Signed   By: Natasha Mead M.D.   On: 05/31/2016 15:57   Dg Fluoro Guide Lumbar Puncture  Result Date: 05/31/2016 CLINICAL DATA:  50 year old female with altered mental status, right side weakness. Code stroke workup thus far today is negative for emergent large vessel occlusion or CT evidence of cortically based infarct. Blood cultures are now positive for gram positive cocci. Diagnostic lumbar puncture requested. Initial encounter. EXAM: DIAGNOSTIC LUMBAR PUNCTURE UNDER FLUOROSCOPIC GUIDANCE FLUOROSCOPY TIME:  Fluoroscopy Time:  0 minutes 6 seconds Radiation Exposure Index (if provided by the fluoroscopic device): Number of Acquired Spot Images: 0 PROCEDURE: Informed consent was obtained from the patient's daughter prior to the procedure, including potential complications of headache, allergy, and pain. A "time-out" was performed. With the patient prone, the lower back was prepped with Betadine. 1% Lidocaine was used for local anesthesia. Lumbar puncture was performed at the L3-L4 level using left sub laminar technique and a 3.5 inch x 20 gauge needle with return of clear CSF with an opening pressure of 18 cm of water. 16 mL of CSF were obtained for laboratory studies. The patient tolerated the procedure well and there were no apparent complications. IMPRESSION: Diagnostic lumbar puncture at L3-L4. Clear CSF with opening pressure of 18 cm of water. 16 mL of CSF obtained for laboratory studies. Electronically Signed   By: Odessa Fleming M.D.   On: 05/31/2016 13:50     STUDIES:  Head CT 3/2>>> neg MRI 3/2 >> remote lacunes post circulation  CULTURES: Blood 3/2>>> Urine 3/2>>> Sputum 3/2>>>  ANTIBIOTICS: Meropenem 3/2 x1 vanc 3/2 x 1  SIGNIFICANT EVENTS:  3/2>>> intubated for inability to  protect her airway  LINES/TUBES: ETT 3/2>>>  DISCUSSION: 51 year old female with seizure history on keppra who was found altered in the SNF and was transferred to [facility] for further evaluation.  Patient became acutely hypertensive and AMS and was intubated.  No meningitis on LP results.  No evidence of acute infection anywhere.  Neurology following.  ASSESSMENT / PLAN:  PULMONARY A: VDRF due to inability to protect her airway. P:   - Vent settings reviewed, adjusted -SBts whena wake  CARDIOVASCULAR A:  Hypertensive emergency P:  - dc Cleviprix drip for SBP 180 - Tele monitoring - resume home metoprolol, amlodipin, lisinopril  RENAL A:   ESRD-HD P:   -HD per Renal  - Replace electrolytes as indicated  GASTROINTESTINAL A:   No active issues P:   - start TF  - Protonix  HEMATOLOGIC A:   No active issues P:  - CBC in AM - Transfuse per ICU protocol  INFECTIOUS A:   No infectious foci noted.  Abx given in ED. P:   - Hold abx  for now - Pan cultures  ENDOCRINE A:   DM   P:   - CBGs - SSI  NEUROLOGIC A:   Acute encephalopathy - meningitis ruled out, Post ictal vs htn induced, no PRES on MRI P:   RASS goal: 0 - Fentanyl prn - Neurology following -EEG   FAMILY  - Updates: daughter 71/3.  - Inter-disciplinary family meet or Palliative Care meeting due by:  day 7  The patient is critically ill with multiple organ systems failure and requires high complexity decision making for assessment and support, frequent evaluation and titration of therapies, application of advanced monitoring technologies and extensive interpretation of multiple databases.   My cc time x53m  Cyril Mourning MD. Tonny Bollman. [facility] Pager [phone] If no response call [phone]    06/01/2016, 11:34 AM

## 2016-06-01 NOTE — Progress Notes (Addendum)
Subjective: Yesterday, she developed extensor posturing to noxious stimuli and therefore a STAT MRI was obtained which did not demonstrate a structural cause. This AM, she is no longer extensor posturing.  Exam: Vitals:   06/01/16 0809 06/01/16 0815  BP: (!) 103/58 (!) 98/54  Pulse: (!) 59 (!) 59  Resp: 16 16  Temp:     Gen: In bed, NAD Resp: non-labored breathing, no acute distress Abd: soft, nt  Neuro: MS: does not open eye or follow commands. She doe grimace to noxious stimuli.  IH:KVQQVZ sluggish burt reactive, corneals intact to eye drops bilaterally.  Motor: she withdraws some to noxious stimuli bilaterally(improvement from extensor posturing).  Sensory:as above.   Pertinent Labs: Elevated creatinine CSF: WBC 6, RBC 555 Protein 43 Glucose 69  Impression: 51 yo F presenting in status epilepticus with subsequent respiratory failure. She has had LP performed  which does not support CNS infection, 1 extra WBC could be explained by the blood in the sample or seizure. MRI ruled out brainstem infarction or PRES. I do think that hypertensive encephalopathy is possible but she lives in the 240s commonly, and I think that most cases of hypertensive encephalopathy have some findings of PRES associated with it. I would favor treating this as such at this time, however.   Also of note, she was started on Risperdal 2 weeks ago, and has had fairly increased tone. NMS could be one consideration.  Recommendations: 1) Continue keppra at 1g daily, extra 500mg  after dialysis.  2) continue hypertensive management.  3) TSH, ammonia 4) CK 5) will continue to follow.    Ritta Slot, MD [facility] [phone]  If 7pm- 7am, please page neurology on call as listed in AMION.

## 2016-06-01 NOTE — Procedures (Signed)
Patient was seen on dialysis and the procedure was supervised.  BFR 400  Via PC BP is  119/84.   Patient appears to be tolerating treatment well- low BP is limiting fluid removal unfortunately   Kimberly Montgomery A 06/01/2016

## 2016-06-02 ENCOUNTER — Inpatient Hospital Stay (HOSPITAL_COMMUNITY): Payer: Medicare HMO

## 2016-06-02 DIAGNOSIS — J9600 Acute respiratory failure, unspecified whether with hypoxia or hypercapnia: Secondary | ICD-10-CM

## 2016-06-02 LAB — BLOOD CULTURE ID PANEL (REFLEXED)
Acinetobacter baumannii: NOT DETECTED
CANDIDA ALBICANS: NOT DETECTED
CANDIDA GLABRATA: NOT DETECTED
CANDIDA KRUSEI: NOT DETECTED
Candida parapsilosis: NOT DETECTED
Candida tropicalis: NOT DETECTED
ENTEROBACTERIACEAE SPECIES: NOT DETECTED
ENTEROCOCCUS SPECIES: NOT DETECTED
ESCHERICHIA COLI: NOT DETECTED
Enterobacter cloacae complex: NOT DETECTED
Haemophilus influenzae: NOT DETECTED
Klebsiella oxytoca: NOT DETECTED
Klebsiella pneumoniae: NOT DETECTED
LISTERIA MONOCYTOGENES: NOT DETECTED
Methicillin resistance: DETECTED — AB
Neisseria meningitidis: NOT DETECTED
PROTEUS SPECIES: NOT DETECTED
PSEUDOMONAS AERUGINOSA: NOT DETECTED
STAPHYLOCOCCUS SPECIES: DETECTED — AB
STREPTOCOCCUS AGALACTIAE: NOT DETECTED
STREPTOCOCCUS PNEUMONIAE: NOT DETECTED
Serratia marcescens: NOT DETECTED
Staphylococcus aureus (BCID): NOT DETECTED
Streptococcus pyogenes: NOT DETECTED
Streptococcus species: NOT DETECTED

## 2016-06-02 LAB — GLUCOSE, CAPILLARY
GLUCOSE-CAPILLARY: 115 mg/dL — AB (ref 65–99)
Glucose-Capillary: 116 mg/dL — ABNORMAL HIGH (ref 65–99)
Glucose-Capillary: 122 mg/dL — ABNORMAL HIGH (ref 65–99)
Glucose-Capillary: 133 mg/dL — ABNORMAL HIGH (ref 65–99)
Glucose-Capillary: 121 mg/dL — ABNORMAL HIGH (ref 65–99)

## 2016-06-02 LAB — CBC
HCT: 36.9 % (ref 36.0–46.0)
HEMOGLOBIN: 11.8 g/dL — AB (ref 12.0–15.0)
MCH: 28.4 pg (ref 26.0–34.0)
MCHC: 32 g/dL (ref 30.0–36.0)
MCV: 88.9 fL (ref 78.0–100.0)
Platelets: 273 10*3/uL (ref 150–400)
RBC: 4.15 MIL/uL (ref 3.87–5.11)
RDW: 14.9 % (ref 11.5–15.5)
WBC: 8.9 10*3/uL (ref 4.0–10.5)

## 2016-06-02 LAB — BASIC METABOLIC PANEL
Anion gap: 10 (ref 5–15)
BUN: 28 mg/dL — AB (ref 6–20)
CHLORIDE: 100 mmol/L — AB (ref 101–111)
CO2: 26 mmol/L (ref 22–32)
Calcium: 10.2 mg/dL (ref 8.9–10.3)
Creatinine, Ser: 4.24 mg/dL — ABNORMAL HIGH (ref 0.44–1.00)
GFR calc Af Amer: 13 mL/min — ABNORMAL LOW (ref >60)
GFR calc non Af Amer: 11 mL/min — ABNORMAL LOW (ref >60)
Glucose, Bld: 116 mg/dL — ABNORMAL HIGH (ref 65–99)
POTASSIUM: 3.5 mmol/L (ref 3.5–5.1)
SODIUM: 136 mmol/L (ref 135–145)

## 2016-06-02 LAB — HEPATITIS B SURFACE ANTIGEN: HEP B S AG: NEGATIVE

## 2016-06-02 LAB — PROCALCITONIN: Procalcitonin: 1.21 ng/mL

## 2016-06-02 MED ORDER — DEXMEDETOMIDINE HCL IN NACL 200 MCG/50ML IV SOLN
0.0000 ug/kg/h | INTRAVENOUS | Status: DC
  Administered 2016-06-02: 0.4 ug/kg/h via INTRAVENOUS
  Administered 2016-06-02: 0.2 ug/kg/h via INTRAVENOUS
  Administered 2016-06-03 (×2): 0.4 ug/kg/h via INTRAVENOUS
  Filled 2016-06-02 (×4): qty 50

## 2016-06-02 MED ORDER — VITAL HIGH PROTEIN PO LIQD
1000.0000 mL | ORAL | Status: DC
  Administered 2016-06-02: 1000 mL
  Administered 2016-06-03 (×2)

## 2016-06-02 MED ORDER — VANCOMYCIN HCL IN DEXTROSE 1-5 GM/200ML-% IV SOLN
1000.0000 mg | INTRAVENOUS | Status: DC
  Filled 2016-06-02 (×2): qty 200

## 2016-06-02 MED ORDER — PRO-STAT SUGAR FREE PO LIQD
30.0000 mL | Freq: Two times a day (BID) | ORAL | Status: DC
  Administered 2016-06-02: 30 mL
  Filled 2016-06-02 (×3): qty 30

## 2016-06-02 MED ORDER — VANCOMYCIN HCL 10 G IV SOLR
2000.0000 mg | Freq: Once | INTRAVENOUS | Status: AC
  Administered 2016-06-02: 2000 mg via INTRAVENOUS
  Filled 2016-06-02: qty 2000

## 2016-06-02 MED ORDER — VANCOMYCIN HCL IN DEXTROSE 1-5 GM/200ML-% IV SOLN
1000.0000 mg | INTRAVENOUS | Status: AC
Start: 2016-06-03 — End: 2016-06-03
  Administered 2016-06-03: 1000 mg via INTRAVENOUS
  Filled 2016-06-02: qty 200

## 2016-06-02 NOTE — Progress Notes (Signed)
Pt very irritable this morning; grimacing,coughing, and shaking. Pt still not following commands at this time. [facility] paged.  New order received for Precedex gtt. Will follow orders and continue to monitor.  Roselie Awkward, RN

## 2016-06-02 NOTE — Progress Notes (Addendum)
PULMONARY / CRITICAL CARE MEDICINE   Name: Kimberly Montgomery MRN: [id number] DOB: Jun 15, 1965    ADMISSION DATE:  05/31/2016 CONSULTATION DATE:  05/31/2016  REFERRING MD:  EDP - Mackuen  CHIEF COMPLAINT:  AMS and seizure  HISTORY OF PRESENT ILLNESS:   51 year old female with PMH of seizure (on keppra), ESRD-HD, DM and CHF who presents to the ED with seizure and AMS.  Patient was brought to the ED where she had another seizure, given ativan and seizure activity was under control.  On 2/17 had positive blood culture (coag negative staph) that was felt to be a contaminant but with stiff neck and fever concern was for meningitis and LP was performed that was reportedly clean.  Patient was intubated for inability to protect her airway and PCCM was called to admit.  Neuro seeing patient.  Reportedly prior to patient losing her mental status again and SBP became 240.  Patient was then intubated.  STUDIES:  Head CT 3/2>>> neg MRI 3/2 >> remote lacunes post circulation EEG 3/3 generalized slowing, no triphasic waves.  No epileptiform discharges.  CULTURES: Blood 3/2>>> coag neg staph 1/2  Urine 3/2>>> Sputum 3/2>>> CSF 3/2 ng  ANTIBIOTICS: Meropenem 3/2 x1 vanc 3/2 >>  SIGNIFICANT EVENTS:  3/2>>> intubated for inability to protect her airway  LINES/TUBES: ETT 3/2>>>  SUBJECTIVE:  remains intubated , off sedation Starting to wake up BP normalised  VITAL SIGNS: BP (!) 110/45   Pulse (!) 57   Temp 98.7 F (37.1 C) (Oral)   Resp 16   Ht 5\' 9"  (1.753 m)   Wt 207 lb 12.8 oz (94.3 kg)   LMP 03/16/2015   SpO2 100%   BMI 30.69 kg/m   HEMODYNAMICS:    VENTILATOR SETTINGS: Vent Mode: PRVC FiO2 (%):  [40 %] 40 % Set Rate:  [16 bmp] 16 bmp Vt Set:  [530 mL] 530 mL PEEP:  [5 cmH20] 5 cmH20 Plateau Pressure:  [17 cmH20-30 cmH20] 17 cmH20  INTAKE / OUTPUT: I/O last 3 completed shifts: In: 892.9 [I.V.:537.9; IV Piggyback:355] Out: 4763 [Urine:374; Emesis/NG output:250;  Other:4139]  PHYSICAL EXAMINATION: General:  Chronically ill appearing female, recently given rocuronium so unable to assess. Neuro:  Opens eyes to DPS, tracks but not following commands HEENT:  [place]/AT, pupils 47mm BERTL Cardiovascular:  RRR, Nl S1/S2, -M/R/G Lungs:  CTA bilaterally  Abdomen:  Soft, NT, ND and +BS. Musculoskeletal:  -edema and -tenderness.  R BKA Skin:  Intact  LABS:  BMET  Recent Labs Lab 05/31/16 1030 05/31/16 1102 06/01/16 0226 06/02/16 0241  NA 132* 132* 133* 136  K 4.1 3.9 3.6 3.5  CL 96* 96* 94* 100*  CO2 24  --  25 26  BUN 33* 36* 39* 28*  CREATININE 4.01* 4.00* 4.40* 4.24*  GLUCOSE 124* 127* 100* 116*    Electrolytes  Recent Labs Lab 05/31/16 1030 06/01/16 0226 06/02/16 0241  CALCIUM 9.7 10.1 10.2  MG  --  2.1  --   PHOS  --  4.2  --     CBC  Recent Labs Lab 05/31/16 1030 05/31/16 1102 06/01/16 0226 06/02/16 0241  WBC 6.4  --  10.2 8.9  HGB 12.0 12.6 11.1* 11.8*  HCT 37.6 37.0 33.6* 36.9  PLT 195  --  236 273    Coag's  Recent Labs Lab 05/31/16 1030  APTT 31  INR 0.99    Sepsis Markers  Recent Labs Lab 05/31/16 1658 06/01/16 0226 06/02/16 0241  PROCALCITON 1.09 1.05  1.21    ABG  Recent Labs Lab 05/31/16 1750 06/01/16 0403  PHART 7.359 7.532*  PCO2ART 48.7* 33.6  PO2ART 437* 171.0*    Liver Enzymes  Recent Labs Lab 05/31/16 1030  AST 15  ALT 11*  ALKPHOS 66  BILITOT 0.5  ALBUMIN 3.1*    Cardiac Enzymes No results for input(s): TROPONINI, PROBNP in the last 168 hours.  Glucose  Recent Labs Lab 06/01/16 1226 06/01/16 1702 06/01/16 2002 06/01/16 2351 06/02/16 0419 06/02/16 0829  GLUCAP 116* 126* 101* 113* 115* 116*    Imaging Dg Chest Port 1 View  Result Date: 06/02/2016 CLINICAL DATA:  Acute respiratory failure EXAM: PORTABLE CHEST 1 VIEW COMPARISON:  06/01/2016 FINDINGS: Endotracheal tube is in place with tip approximately 4.5 cm above the carina. Nasogastric tube is in place, tip  beyond the edge of the image beyond the gastroesophageal junction. Left dual lumen catheter tip overlies the level of the superior vena cava-right atrium. The heart is enlarged. Possible mild infiltrate or atelectasis in the medial left lung base. No pulmonary edema. IMPRESSION: 1. Lines and tubes appear unchanged. 2. Left lower lobe atelectasis or early infiltrate. Electronically Signed   By: Norva Pavlov M.D.   On: 06/02/2016 07:57      DISCUSSION: 51 year old female with seizure history on keppra who was found altered in the SNF and was transferred to [facility] for further evaluation and was intubated.  No meningitis on LP results.  Neurology following. Coag neg staph 1/2this time, also noted 2/17 , hence will treat since she has permacath  ASSESSMENT / PLAN:  PULMONARY A: VDRF due to inability to protect her airway. P:   - Vent settings reviewed, adjusted -SBts when awake  CARDIOVASCULAR A:  Hypertensive emergency P:  - dc Cleviprix drip  - Tele monitoring - resumed home metoprolol, amlodipin, lisinopril  RENAL A:   ESRD-HD P:   -HD per Renal , next on Monday - Replace electrolytes as indicated  GASTROINTESTINAL A:   No active issues P:   - start TF  - Protonix  HEMATOLOGIC A:   No active issues P:  - CBC in AM - Transfuse per ICU protocol  INFECTIOUS A:   Recurrent MRSE 1/2 in feb & this admit P:   -Vanc -rpt cx  ENDOCRINE A:   DM   P:   - CBGs - SSI  NEUROLOGIC A:   Acute encephalopathy - meningitis ruled out, Post ictal vs htn induced, no PRES on MRI No evidence of new infarcts or  ongoingseizure P:   RASS goal: 0 - Fentanyl prn -Low dose precedex gtt - Neurology following -hold neuroleptics - risperidone/ artane    FAMILY  - Updates: daughter 80/4  - Inter-disciplinary family meet or Palliative Care meeting due by:  day 7  The patient is critically ill with multiple organ systems failure and requires high complexity decision making  for assessment and support, frequent evaluation and titration of therapies, application of advanced monitoring technologies and extensive interpretation of multiple databases.   My cc time x 8m  Cyril Mourning MD. FCCP. [facility] Pager [phone] If no response call [phone]    06/02/2016, 10:47 AM

## 2016-06-02 NOTE — Progress Notes (Signed)
Pt's daughter expressed disappointment that tube feeds were started. Stated: "She would not want to be on a tube feeds. She even did not wanted to be intubated. It was my fault."  Emotional support and education about importance of nutritional support was given. Suggestion about speak to the MD about code status was given.  Daughter states: "I just do not know what to do. I live one hour ago and do not want to leave. What if something happens when I am not here. I also have 2 children.  I do not know if she will get better. I know she would not want to live without quality of life. I will talk to doctor tomorrow. I just do not know what to do".  Again, emotional support was given. Suggestion of writing a possible questions and having when doctor comes in.   Will be reported to upcoming nurse.

## 2016-06-02 NOTE — Progress Notes (Signed)
Subjective: Interval History: No clear seizures.  Off all sedation.   Bedside EEG - generalized slowing, no triphasic waves.  No epileptiform discharges.  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    Objective: Vital signs in last 24 hours: Temp:  [98.7 F (37.1 C)-100.1 F (37.8 C)] 98.7 F (37.1 C) (03/04 0827) Pulse Rate:  [56-93] 56 (03/04 0827) Resp:  [16-19] 16 (03/04 0827) BP: (82-184)/(33-110) 120/55 (03/04 0827) SpO2:  [100 %] 100 % (03/04 0827) FiO2 (%):  [40 %] 40 % (03/04 0827) Weight:  [94.3 kg (207 lb 12.8 oz)] 94.3 kg (207 lb 12.8 oz) (03/04 0434)  Intake/Output from previous day: 03/03 0701 - 03/04 0700 In: 495 [I.V.:245; IV Piggyback:250] Out: 6195 [Urine:144] Intake/Output this shift: No intake/output data recorded. Nutritional status: Diet NPO time specified  Neurologic Exam:  Awakens and opens eyes to tactile stimuli.   Makes eye contact. Grimaces to pain in all extremities. Not following commands. Localized to pain in lower extremity.   Lab Results:  Recent Labs  06/01/16 0226 06/02/16 0241  WBC 10.2 8.9  HGB 11.1* 11.8*  HCT 33.6* 36.9  PLT 236 273  NA 133* 136  K 3.6 3.5  CL 94* 100*  CO2 25 26  GLUCOSE 100* 116*  BUN 39* 28*  CREATININE 4.40* 4.24*  CALCIUM 10.1 10.2   Lipid Panel No results for input(s): CHOL, TRIG, HDL, CHOLHDL, VLDL, LDLCALC in the last 72 hours.  Studies/Results: Ct Angio Head W Or Wo Contrast  Result Date: 05/31/2016 CLINICAL DATA:  51 year old female code stroke. Right side weakness. Initial encounter. EXAM: CT ANGIOGRAPHY HEAD AND NECK TECHNIQUE: Multidetector CT imaging of the head and neck was performed using the standard protocol during bolus administration of intravenous contrast. Multiplanar CT image reconstructions and MIPs were obtained to evaluate the vascular anatomy. Carotid stenosis measurements (when applicable) are obtained utilizing NASCET criteria, using the distal  internal carotid diameter as the denominator. CONTRAST:  50 mL Isovue 370 COMPARISON:  Noncontrast head CT 1102 hours today. FINDINGS: CTA NECK Skeleton: No acute osseous abnormality identified. Mild for age spine degeneration. Upper chest: Negative lung apices. No superior mediastinal lymphadenopathy. There is a left internal jugular approach dual lumen dialysis type catheter in place. The visible SVC is patent. Other neck: Subcentimeter left thyroid lobe and isthmus nodules which do not meet size criteria for ultrasound follow-up. Negative larynx, pharynx, parapharyngeal spaces, retropharyngeal space, sublingual space, submandibular glands and parotid glands. No lymphadenopathy. Aortic arch: 3 vessel arch configuration. Mild arch and proximal great vessels soft and calcified atherosclerotic plaque. Right carotid system: No brachiocephalic artery or right CCA origin stenosis despite some plaque. Mild circumferential soft plaque in the right CCA. Mostly calcified plaque at the right carotid bifurcation which is mild and results in less than 50 % stenosis with respect to the distal vessel. Somewhat non dominant appearing right ICA. Left carotid system: No left CCA origin stenosis despite soft and calcified plaque. Minimal soft and calcified plaque elsewhere in the left CCA. At the left carotid bifurcation there is combination soft and calcified plaque resulting in a short segment of high-grade stenosis estimated at 70 % stenosis with respect to the distal vessel (series 402, images 124n 125). Calcified plaque continues into the left ICA bulb but there is no additional cervical left ICA stenosis. Vertebral arteries: Calcified and soft plaque at the right subclavian artery origin with no hemodynamically significant stenosis. Normal right vertebral artery origin. Normal cervical right vertebral artery.  No proximal left subclavian artery stenosis despite soft and calcified plaque. There is soft plaque at the left  vertebral artery origin with mild or at most moderate stenosis (series 403, image 138). Mild left V2 segment calcified plaque without stenosis. The left vertebral artery is mildly non dominant. CTA HEAD Posterior circulation: Right V4 segment calcified plaque with mild distal right vertebral artery stenosis. Patent vertebrobasilar junction. Combined soft and calcified left V4 segment plaque with similar mild stenosis. Normal left vertebrobasilar junction. Both a ICAs appear dominant. No basilar stenosis. Normal SCA and PCA origins. Diminutive posterior communicating arteries. Bilateral PCA branches are within normal limits. Anterior circulation: Both ICA siphons are patent. There is moderate to severe bilateral siphon calcified plaque worse on the right. On the left side there is moderate cavernous left ICA stenosis (series 404, images 129 in 04/1928. On the right there is moderate to severe stenosis at both the proximal and distal cavernous segments (series 402, image 239). Normal ophthalmic artery origins. Carotid termini are patent. The left A1 segment is dominant and the right A1 is diminutive or absent. Anterior communicating artery and bilateral ACA branches are within normal limits. Right MCA M1 segment, right MCA bifurcation, and right MCA branches are within normal limits. The left MCA M1 segment is patent and bifurcates early. There is a patent left anterior temporal artery branch on series 404, image 133. The left MCA bifurcation then is patent. No left MCA branch occlusion or significant stenosis is identified. Venous sinuses: Patent. Anatomic variants: Mildly dominant right vertebral artery. Mildly dominant left ICA, with dominant left and diminutive or absent right ACA A1 segments. Review of the MIP images confirms the above findings IMPRESSION: 1. Negative for emergent large vessel occlusion. This was preliminarily discussed with Dr. Roland Rack via telephone at 1141 hours. 2. 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 3. There is also extensive ICA siphon calcified plaque with moderate left and moderate to severe right ICA siphon stenosis. 4. Mildly dominant right vertebral artery. Mild bilateral V4 segment vertebral artery stenosis, with mild to moderate stenosis at the origin of the left vertebral artery. 5. Left IJ approach dual lumen dialysis type catheter in place. Electronically Signed   By: Genevie Ann M.D.   On: 05/31/2016 11:56   Ct Head Wo Contrast  Result Date: 05/31/2016 CLINICAL DATA:  51 year old female admitted with altered mental status, initially thought to be code stroke. Subsequent positive blood cultures. Status post fluoroscopic guided lumbar puncture at 1255 hours today. Acutely decompensated with hypertension and bradycardia. Now intubated. Initial encounter. EXAM: CT HEAD WITHOUT CONTRAST TECHNIQUE: Contiguous axial images were obtained from the base of the skull through the vertex without intravenous contrast. COMPARISON:  Cta head and neck 1123 hours today and head CT without contrast 1102 hours today. FINDINGS: Brain: No acute intracranial hemorrhage identified. No midline shift, mass effect, or evidence of intracranial mass lesion. No ventriculomegaly. Stable gray-white matter differentiation throughout the brain. No cortically based acute infarct identified. Vascular: Calcified atherosclerosis at the skull base. Skull: No acute osseous abnormality identified. Sinuses/Orbits: New left maxillary sinus fluid level and mucosal thickening. The nasal cavity and nasopharynx now all are opacified. Heterogeneous gas at the anterior nasal septum appears to occupy an area of chronic septal erosion. Other Visualized paranasal sinuses and mastoids are stable and well pneumatized. Other: No scalp hematoma. Orbits soft tissues appear stable. Aside from the pharynx opacification negative visualized noncontrast deep soft tissue  spaces of the face. IMPRESSION: 1. Stable CT appearance of the brain since 1102 hours today. No acute intracranial abnormality. 2. Fluid now throughout the nasal cavity and visible pharynx in the setting of intubation. Electronically Signed   By: Genevie Ann M.D.   On: 05/31/2016 16:37   Ct Head Wo Contrast  Result Date: 05/31/2016 CLINICAL DATA:  51 year old female with right side weakness. Code stroke. Initial encounter. EXAM: CT HEAD WITHOUT CONTRAST TECHNIQUE: Contiguous axial images were obtained from the base of the skull through the vertex without intravenous contrast. COMPARISON:  Head CT 02/25/2016 FINDINGS: Brain: Gray-white matter differentiation appears stable and within normal limits throughout the brain. No cortically based acute infarct identified. No acute intracranial hemorrhage identified. No midline shift, mass effect, or evidence of intracranial mass lesion. No ventriculomegaly. Vascular: Calcified atherosclerosis at the skull base. No suspicious intracranial vascular hyperdensity. Skull: No acute osseous abnormality identified. Sinuses/Orbits: Mild mucosal thickening in the posterior left maxillary sinus. Other Visualized paranasal sinuses and mastoids are stable and well pneumatized. Other: Orbit and scalp soft tissues appear stable and negative. ASPECTS score = 10 [place] Stroke Program Early CT Score Normal score = 10 IMPRESSION: 1. Stable and normal Normal noncontrast CT appearance of the brain. 2. ASPECTS 10. 3. The above was relayed via text pager to Dr. Roland Rack on 05/31/2016 at 11:09 . Electronically Signed   By: Genevie Ann M.D.   On: 05/31/2016 11:09   Ct Angio Neck W Or Wo Contrast  Result Date: 05/31/2016 CLINICAL DATA:  51 year old female code stroke. Right side weakness. Initial encounter. EXAM: CT ANGIOGRAPHY HEAD AND NECK TECHNIQUE: Multidetector CT imaging of the head and neck was performed using the standard protocol during bolus administration of intravenous contrast.  Multiplanar CT image reconstructions and MIPs were obtained to evaluate the vascular anatomy. Carotid stenosis measurements (when applicable) are obtained utilizing NASCET criteria, using the distal internal carotid diameter as the denominator. CONTRAST:  50 mL Isovue 370 COMPARISON:  Noncontrast head CT 1102 hours today. FINDINGS: CTA NECK Skeleton: No acute osseous abnormality identified. Mild for age spine degeneration. Upper chest: Negative lung apices. No superior mediastinal lymphadenopathy. There is a left internal jugular approach dual lumen dialysis type catheter in place. The visible SVC is patent. Other neck: Subcentimeter left thyroid lobe and isthmus nodules which do not meet size criteria for ultrasound follow-up. Negative larynx, pharynx, parapharyngeal spaces, retropharyngeal space, sublingual space, submandibular glands and parotid glands. No lymphadenopathy. Aortic arch: 3 vessel arch configuration. Mild arch and proximal great vessels soft and calcified atherosclerotic plaque. Right carotid system: No brachiocephalic artery or right CCA origin stenosis despite some plaque. Mild circumferential soft plaque in the right CCA. Mostly calcified plaque at the right carotid bifurcation which is mild and results in less than 50 % stenosis with respect to the distal vessel. Somewhat non dominant appearing right ICA. Left carotid system: No left CCA origin stenosis despite soft and calcified plaque. Minimal soft and calcified plaque elsewhere in the left CCA. At the left carotid bifurcation there is combination soft and calcified plaque resulting in a short segment of high-grade stenosis estimated at 70 % stenosis with respect to the distal vessel (series 402, images 124n 125). Calcified plaque continues into the left ICA bulb but there is no additional cervical left ICA stenosis. Vertebral arteries: Calcified and soft plaque at the right subclavian artery origin with no hemodynamically significant  stenosis. Normal right vertebral artery origin. Normal cervical right vertebral artery. No proximal left subclavian  artery stenosis despite soft and calcified plaque. There is soft plaque at the left vertebral artery origin with mild or at most moderate stenosis (series 403, image 138). Mild left V2 segment calcified plaque without stenosis. The left vertebral artery is mildly non dominant. CTA HEAD Posterior circulation: Right V4 segment calcified plaque with mild distal right vertebral artery stenosis. Patent vertebrobasilar junction. Combined soft and calcified left V4 segment plaque with similar mild stenosis. Normal left vertebrobasilar junction. Both a ICAs appear dominant. No basilar stenosis. Normal SCA and PCA origins. Diminutive posterior communicating arteries. Bilateral PCA branches are within normal limits. Anterior circulation: Both ICA siphons are patent. There is moderate to severe bilateral siphon calcified plaque worse on the right. On the left side there is moderate cavernous left ICA stenosis (series 404, images 129 in 04/1928. On the right there is moderate to severe stenosis at both the proximal and distal cavernous segments (series 402, image 239). Normal ophthalmic artery origins. Carotid termini are patent. The left A1 segment is dominant and the right A1 is diminutive or absent. Anterior communicating artery and bilateral ACA branches are within normal limits. Right MCA M1 segment, right MCA bifurcation, and right MCA branches are within normal limits. The left MCA M1 segment is patent and bifurcates early. There is a patent left anterior temporal artery branch on series 404, image 133. The left MCA bifurcation then is patent. No left MCA branch occlusion or significant stenosis is identified. Venous sinuses: Patent. Anatomic variants: Mildly dominant right vertebral artery. Mildly dominant left ICA, with dominant left and diminutive or absent right ACA A1 segments. Review of the MIP images  confirms the above findings IMPRESSION: 1. Negative for emergent large vessel occlusion. This was preliminarily discussed with Dr. Roland Rack via telephone at 1141 hours. 2. 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 3. There is also extensive ICA siphon calcified plaque with moderate left and moderate to severe right ICA siphon stenosis. 4. Mildly dominant right vertebral artery. Mild bilateral V4 segment vertebral artery stenosis, with mild to moderate stenosis at the origin of the left vertebral artery. 5. Left IJ approach dual lumen dialysis type catheter in place. Electronically Signed   By: Genevie Ann M.D.   On: 05/31/2016 11:56   Mr Brain Wo Contrast  Result Date: 05/31/2016 CLINICAL DATA:  Acute onset of altered mental status. Seizure-like activity. EXAM: MRI HEAD WITHOUT CONTRAST TECHNIQUE: Multiplanar, multiecho pulse sequences of the brain and surrounding structures were obtained without intravenous contrast. COMPARISON:  CT head without contrast 05/31/2016 FINDINGS: Brain: The diffusion-weighted images demonstrate no evidence for acute or subacute infarction. Mild age advanced atrophy and white matter changes are present bilaterally. T2 changes are present in the brainstem. A remote infarct is evident within the left middle cerebellar peduncle. Additional remote lacunar infarcts are present in the left thalamus. A remote punctate lacunar infarct is present in the inferior right cerebellum. Dedicated imaging of the temporal lobes demonstrate symmetric size and signal of the hippocampal structures. Vascular: Flow is present in the major intracranial arteries. Skull and upper cervical spine: The patient is intubated. Skullbase is otherwise within normal limits. Fluid is present in the nasopharynx. Craniocervical junction is normal. Midline sagittal structures are unremarkable. Sinuses/Orbits: A fluid level is present in the left maxillary  sinus. There is diffuse mucosal thickening in the ethmoid air cells, sphenoid sinuses, and right maxillary sinus. The frontal sinuses are clear bilaterally. Minimal mastoid fluid is present bilaterally. IMPRESSION:  1. No acute intracranial abnormality to explain 7 not altered mental status. 2. Remote lacunar infarcts within the posterior circulation as described. 3. Mild generalized atrophy and white matter disease is advanced for age. Electronically Signed   By: San Morelle M.D.   On: 05/31/2016 20:29   Dg Chest Port 1 View  Result Date: 06/02/2016 CLINICAL DATA:  Acute respiratory failure EXAM: PORTABLE CHEST 1 VIEW COMPARISON:  06/01/2016 FINDINGS: Endotracheal tube is in place with tip approximately 4.5 cm above the carina. Nasogastric tube is in place, tip beyond the edge of the image beyond the gastroesophageal junction. Left dual lumen catheter tip overlies the level of the superior vena cava-right atrium. The heart is enlarged. Possible mild infiltrate or atelectasis in the medial left lung base. No pulmonary edema. IMPRESSION: 1. Lines and tubes appear unchanged. 2. Left lower lobe atelectasis or early infiltrate. Electronically Signed   By: Nolon Nations M.D.   On: 06/02/2016 07:57   Dg Chest Port 1 View  Result Date: 06/01/2016 CLINICAL DATA:  Respiratory failure. EXAM: PORTABLE CHEST 1 VIEW COMPARISON:  Radiographs of May 31, 2016. FINDINGS: Endotracheal and nasogastric tubes are unchanged in position. Left internal jugular dialysis catheter is unchanged. No pneumothorax or pleural effusion is noted. Mild bibasilar subsegmental atelectasis is noted. Bony thorax is unremarkable. IMPRESSION: Mild bibasilar subsegmental atelectasis.  Stable support apparatus. Electronically Signed   By: Marijo Conception, M.D.   On: 06/01/2016 08:29   Dg Chest Portable 1 View  Result Date: 05/31/2016 CLINICAL DATA:  Post intubation EXAM: PORTABLE CHEST 1 VIEW COMPARISON:  05/18/2016 FINDINGS:  Cardiomegaly again noted. Double lumen left IJ catheter is unchanged in position. There is endotracheal tube in place with tip 2.7 cm above the carina. NG tube in place. The tip of the NG tube is not included on the film. Central mild vascular congestion without convincing pulmonary edema. Mild right basilar atelectasis. No segmental infiltrate. IMPRESSION: Double lumen left IJ catheter is unchanged in position. There is endotracheal tube in place with tip 2.7 cm above the carina. NG tube in place. The tip of the NG tube is not included on the film. Central mild vascular congestion without convincing pulmonary edema. Mild right basilar atelectasis. No segmental infiltrate. Electronically Signed   By: Lahoma Crocker M.D.   On: 05/31/2016 15:57   Dg Fluoro Guide Lumbar Puncture  Result Date: 05/31/2016 CLINICAL DATA:  51 year old female with altered mental status, right side weakness. Code stroke workup thus far today is negative for emergent large vessel occlusion or CT evidence of cortically based infarct. Blood cultures are now positive for gram positive cocci. Diagnostic lumbar puncture requested. Initial encounter. EXAM: DIAGNOSTIC LUMBAR PUNCTURE UNDER FLUOROSCOPIC GUIDANCE FLUOROSCOPY TIME:  Fluoroscopy Time:  0 minutes 6 seconds Radiation Exposure Index (if provided by the fluoroscopic device): Number of Acquired Spot Images: 0 PROCEDURE: Informed consent was obtained from the patient's daughter prior to the procedure, including potential complications of headache, allergy, and pain. A "time-out" was performed. With the patient prone, the lower back was prepped with Betadine. 1% Lidocaine was used for local anesthesia. Lumbar puncture was performed at the L3-L4 level using left sub laminar technique and a 3.5 inch x 20 gauge needle with return of clear CSF with an opening pressure of 18 cm of water. 16 mL of CSF were obtained for laboratory studies. The patient tolerated the procedure well and there were no  apparent complications. IMPRESSION: Diagnostic lumbar puncture at L3-L4. Clear CSF with opening pressure  of 18 cm of water. 16 mL of CSF obtained for laboratory studies. Electronically Signed   By: Genevie Ann M.D.   On: 05/31/2016 13:50    Medications:  Scheduled: . chlorhexidine gluconate (MEDLINE KIT)  15 mL Mouth Rinse BID  . Chlorhexidine Gluconate Cloth  6 each Topical q morning - 10a  . heparin  5,000 Units Subcutaneous Q8H  . insulin aspart  0-20 Units Subcutaneous Q4H  . levETIRAcetam  1,000 mg Intravenous Daily  . levETIRAcetam  500 mg Per Tube Q T,Th,Sat-1800  . lisinopril  10 mg Per Tube Daily  . mouth rinse  15 mL Mouth Rinse 10 times per day  . metoprolol tartrate  50 mg Per Tube BID  . pantoprazole sodium  40 mg Per Tube QHS    Assessment/Plan:  Admitted with what sounds like was focal motor seizures emanating from the left frontal lobe.  No evidence of this focus on EEG and no lesion on MRI in this location.   EEG consistent with a metabolic encephalopathy and/or prolonged post-ictal state from prior seizures.  Her GFR is very low and that could have put her at risk of slow recovery from prior seizures.  I expect her to slowly improve in mental status.  Continue to observe.    LOS: 2 days   Rogue Jury, MD 06/02/2016  8:45 AM

## 2016-06-02 NOTE — Procedures (Signed)
Continuous Video-EEG Monitoring Report  Patient: Kimberly Montgomery, [name]     EEG No.ID: [id number]     DOB: 09/05/1965 Age: 51 Room#2M12  MED REC NO: [id number] Gender: Female   TECH: Amy Zwelling     Physician: Ronnie Doss     Referring Physician: Ritta Slot     Report Date: 06/02/2016       Study Duration: 06/01/2016 16:22 to 06/02/2016 07:30 CPT Code:  11941 Diagnosis:  Status epilepticus (G40.901)  History: This is a 51 year old female presenting with status epilepticus.  Video-EEG monitoring was performed to evaluate for seizures.  Technical Details:  Long-term video-EEG monitoring was performed using standard setting per the guidelines.  Briefly, a minimum of 21 electrodes were placed on scalp according to the International 10-20 or/and 10-10 Systems.  Supplemental electrodes were placed as needed.  Single EKG electrode was also used to detect cardiac arrhythmia.  Patient's behavior was continuously recorded on video simultaneously with EEG.  A minimum of 16 channels were used for data display.  Each epoch of study was reviewed manually daily and as needed using standard referential and bipolar montages.    EEG Description:  There was generalized polymorphic delta slowing.  Diffuse beta activity was also seen, likely due to medication effect (e.g., propofol, benzodiazepine).  No posterior dominant rhythm or sleep architecture was present.  Intermittent runs of generalized periodic discharges with triphasic morphology ("triphasic waves") were present, at intervals of 1/1-2 seconds, without any evolution or clinical correlates. The triphasic waves decreased gradually.  No epileptiform discharges or seizures were in evidence.  There were 22 "events" marked on the study that had no EEG correlate or clinical changes on video to suggest seizures (the exact reason for these event button pushes is not clear, possibly inadvertent button pushes).  Impression:  This is an abnormal EEGdue to the presence  of the following: 1) Generalized polymorphic delta slowing; 2) Intermittent triphasic waves. Taken together, these results suggest severe encephalopathy, most likely due to toxic-metabolic etiology, but medication effect could not be excluded.No epileptiform discharges or seizures were present.       Reading Physician: Ronnie Doss, MD, PhD

## 2016-06-02 NOTE — Progress Notes (Signed)
Ring was given back to the patient's daughter. Taken home

## 2016-06-02 NOTE — Progress Notes (Addendum)
PHARMACY - PHYSICIAN COMMUNICATION CRITICAL VALUE ALERT - BLOOD CULTURE IDENTIFICATION (BCID)  Results for orders placed or performed during the hospital encounter of 05/18/16  Blood Culture ID Panel (Reflexed) (Collected: 05/18/2016 10:02 AM)  Result Value Ref Range   Enterococcus species NOT DETECTED NOT DETECTED   Listeria monocytogenes NOT DETECTED NOT DETECTED   Staphylococcus species DETECTED (A) NOT DETECTED   Staphylococcus aureus NOT DETECTED NOT DETECTED   Methicillin resistance DETECTED (A) NOT DETECTED   Streptococcus species NOT DETECTED NOT DETECTED   Streptococcus agalactiae NOT DETECTED NOT DETECTED   Streptococcus pneumoniae NOT DETECTED NOT DETECTED   Streptococcus pyogenes NOT DETECTED NOT DETECTED   Acinetobacter baumannii NOT DETECTED NOT DETECTED   Enterobacteriaceae species NOT DETECTED NOT DETECTED   Enterobacter cloacae complex NOT DETECTED NOT DETECTED   Escherichia coli NOT DETECTED NOT DETECTED   Klebsiella oxytoca NOT DETECTED NOT DETECTED   Klebsiella pneumoniae NOT DETECTED NOT DETECTED   Proteus species NOT DETECTED NOT DETECTED   Serratia marcescens NOT DETECTED NOT DETECTED   Haemophilus influenzae NOT DETECTED NOT DETECTED   Neisseria meningitidis NOT DETECTED NOT DETECTED   Pseudomonas aeruginosa NOT DETECTED NOT DETECTED   Candida albicans NOT DETECTED NOT DETECTED   Candida glabrata NOT DETECTED NOT DETECTED   Candida krusei NOT DETECTED NOT DETECTED   Candida parapsilosis NOT DETECTED NOT DETECTED   Candida tropicalis NOT DETECTED NOT DETECTED    Name of physician (or Provider) Contacted: Dr Vassie Loll  Changes to prescribed antibiotics required: Not currently on ABx, likely contaminant as staph spp. in 1/2. Chronic HD line - will treat and get repeat bcx  Isaac Bliss, PharmD, BCPS, BCCCP Clinical Pharmacist Clinical phone for 06/02/2016 from 7a-3:30p: [phone] If after 3:30p, please call main pharmacy at: [phone] 06/02/2016 10:56 AM

## 2016-06-02 NOTE — Progress Notes (Signed)
Subjective:  HD yest- removed over 4 liters- attempted to stop sedation- got hypertensive- now on precedex- BP lowish  Objective Vital signs in last 24 hours: Vitals:   06/02/16 0630 06/02/16 0645 06/02/16 0700 06/02/16 0730  BP: (!) 87/35 (!) 92/36 (!) 105/37 (!) 101/50  Pulse: 67 65 62 (!) 58  Resp: _0 Temp:      TempSrc:      SpO2: 100% 100% 100% 100%  Weight:      Height:       Weight change: -15.142 kg (-33 lb 6.1 oz)  Intake/Output Summary (Last 24 hours) at 06/02/16 0817 Last data filed at 06/02/16 0700  Gross per 24 hour  Intake           482.54 ml  Output             4258 ml  Net         -3775.46 ml    Dialyzes at [facility] on [address]  TTS EDW 108. 4 hours- usually needs 4-5 L removed per treatment HD Bath 2K, Dialyzer unknown, Heparin yes, 2000 bolus and 900/h. Access PC- 400 blood flow rate. TTS Epogen 6003 times a week, Iron 100 2 times a week   Assessment/Plan: 51 year old with multiple medical issues including ESRD and seizure disorder. Presenting with decreased mental status as well as seizure 1 seizure/decreased mental status- patient has a history of seizure disorder. Neurology has been consulted. Lumbar puncture was without evidence of meningitis. Could this possibly be hypertensive encephalopathy? Supportive care and antiseizure medications. HCT negative times 2, MRI negative as well for acute change- on keppra. EEG pending-   abx stopped -  follow clinically  2 ESRD: Normally TTS with [facility]. Had Saturday. 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  Supposedly on amlodipine, lisinopril, Lopressor, and hydralazine but again with a history of noncompliance.  Now on amlod, lisinopril and lopressor- BP good to low- has a lot of fluid I actually think I will hold norvasc and we will focus on UF for HD MOn and Tuesday 4. Anemia of ESRD: Does not appear  to be a significant issue at this time 5. 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 Will resume Renvela once extubated    Shelly Spenser A    Labs: Basic Metabolic Panel:  Recent Labs Lab 05/31/16 1030 05/31/16 1102 06/01/16 0226 06/02/16 0241  NA 132* 132* 133* 136  K 4.1 3.9 3.6 3.5  CL 96* 96* 94* 100*  CO2 24  --  25 26  GLUCOSE 124* 127* 100* 116*  BUN 33* 36* 39* 28*  CREATININE 4.01* 4.00* 4.40* 4.24*  CALCIUM 9.7  --  10.1 10.2  PHOS  --   --  4.2  --    Liver Function Tests:  Recent Labs Lab 05/31/16 1030  AST 15  ALT 11*  ALKPHOS 66  BILITOT 0.5  PROT 6.2*  ALBUMIN 3.1*   No results for input(s): LIPASE, AMYLASE in the last 168 hours.  Recent Labs Lab 06/01/16 0910  AMMONIA <9*   CBC:  Recent Labs Lab 05/31/16 1030 05/31/16 1102 06/01/16 0226 06/02/16 0241  WBC 6.4  --  10.2 8.9  NEUTROABS 4.2  --   --   --   HGB 12.0 12.6 11.1* 11.8*  HCT 37.6 37.0 33.6* 36.9  MCV 88.7  --  86.4 88.9  PLT 195  --  236 273   Cardiac Enzymes:  Recent Labs Lab 06/01/16 1552  CKTOTAL 24*   CBG:  Recent Labs Lab 06/01/16 1226 06/01/16 1702 06/01/16 2002 06/01/16 2351 06/02/16 0419  GLUCAP 116* 126* 101* 113* 115*    Iron Studies: No results for input(s): IRON, TIBC, TRANSFERRIN, FERRITIN in the last 72 hours. Studies/Results: Ct Angio Head W Or Wo Contrast  Result Date: 05/31/2016 CLINICAL DATA:  51 year old female code stroke. Right side weakness. Initial encounter. EXAM: CT ANGIOGRAPHY HEAD AND NECK TECHNIQUE: Multidetector CT imaging of the head and neck was performed using the standard protocol during bolus administration of intravenous contrast. Multiplanar CT image reconstructions and MIPs were obtained to evaluate the vascular anatomy. Carotid stenosis measurements (when applicable) are obtained utilizing NASCET criteria, using the distal internal carotid diameter as the denominator. CONTRAST:  50 mL  Isovue 370 COMPARISON:  Noncontrast head CT 1102 hours today. FINDINGS: CTA NECK Skeleton: No acute osseous abnormality identified. Mild for age spine degeneration. Upper chest: Negative lung apices. No superior mediastinal lymphadenopathy. There is a left internal jugular approach dual lumen dialysis type catheter in place. The visible SVC is patent. Other neck: Subcentimeter left thyroid lobe and isthmus nodules which do not meet size criteria for ultrasound follow-up. Negative larynx, pharynx, parapharyngeal spaces, retropharyngeal space, sublingual space, submandibular glands and parotid glands. No lymphadenopathy. Aortic arch: 3 vessel arch configuration. Mild arch and proximal great vessels soft and calcified atherosclerotic plaque. Right carotid system: No brachiocephalic artery or right CCA origin stenosis despite some plaque. Mild circumferential soft plaque in the right CCA. Mostly calcified plaque at the right carotid bifurcation which is mild and results in less than 50 % stenosis with respect to the distal vessel. Somewhat non dominant appearing right ICA. Left carotid system: No left CCA origin stenosis despite soft and calcified plaque. Minimal soft and calcified plaque elsewhere in the left CCA. At the left carotid bifurcation there is combination soft and calcified plaque resulting in a short segment of high-grade stenosis estimated at 70 % stenosis with respect to the distal vessel (series 402, images 124n 125). Calcified plaque continues into the left ICA bulb but there is no additional cervical left ICA stenosis. Vertebral arteries: Calcified and soft plaque at the right subclavian artery origin with no hemodynamically significant stenosis. Normal right vertebral artery origin. Normal cervical right vertebral artery. No proximal left subclavian artery stenosis despite soft and calcified plaque. There is soft plaque at the left vertebral artery origin with mild or at most moderate stenosis (series  403, image 138). Mild left V2 segment calcified plaque without stenosis. The left vertebral artery is mildly non dominant. CTA HEAD Posterior circulation: Right V4 segment calcified plaque with mild distal right vertebral artery stenosis. Patent vertebrobasilar junction. Combined soft and calcified left V4 segment plaque with similar mild stenosis. Normal left vertebrobasilar junction. Both a ICAs appear dominant. No basilar stenosis. Normal SCA and PCA origins. Diminutive posterior communicating arteries. Bilateral PCA branches are within normal limits. Anterior circulation: Both ICA siphons are patent. There is moderate to severe bilateral siphon calcified plaque worse on the right. On the left side there is moderate cavernous left ICA stenosis (series 404, images 129 in 04/1928. On the right there is moderate to severe stenosis at both the proximal and distal cavernous segments (series 402, image 239). Normal ophthalmic artery origins. Carotid termini are patent. The left A1 segment is dominant and the right A1 is diminutive or absent.  Anterior communicating artery and bilateral ACA branches are within normal limits. Right MCA M1 segment, right MCA bifurcation, and right MCA branches are within normal limits. The left MCA M1 segment is patent and bifurcates early. There is a patent left anterior temporal artery branch on series 404, image 133. The left MCA bifurcation then is patent. No left MCA branch occlusion or significant stenosis is identified. Venous sinuses: Patent. Anatomic variants: Mildly dominant right vertebral artery. Mildly dominant left ICA, with dominant left and diminutive or absent right ACA A1 segments. Review of the MIP images confirms the above findings IMPRESSION: 1. Negative for emergent large vessel occlusion. This was preliminarily discussed with Dr. Roland Rack via telephone at 1141 hours. 2. Positive for high-grade atherosclerotic stenosis at the left ICA origin, numerically  estimated at 70%. 3. There is also extensive ICA siphon calcified plaque with moderate left and moderate to severe right ICA siphon stenosis. 4. Mildly dominant right vertebral artery. Mild bilateral V4 segment vertebral artery stenosis, with mild to moderate stenosis at the origin of the left vertebral artery. 5. Left IJ approach dual lumen dialysis type catheter in place. Electronically Signed   By: Genevie Ann M.D.   On: 05/31/2016 11:56   Ct Head Wo Contrast  Result Date: 05/31/2016 CLINICAL DATA:  51 year old female admitted with altered mental status, initially thought to be code stroke. Subsequent positive blood cultures. Status post fluoroscopic guided lumbar puncture at 1255 hours today. Acutely decompensated with hypertension and bradycardia. Now intubated. Initial encounter. EXAM: CT HEAD WITHOUT CONTRAST TECHNIQUE: Contiguous axial images were obtained from the base of the skull through the vertex without intravenous contrast. COMPARISON:  Cta head and neck 1123 hours today and head CT without contrast 1102 hours today. FINDINGS: Brain: No acute intracranial hemorrhage identified. No midline shift, mass effect, or evidence of intracranial mass lesion. No ventriculomegaly. Stable gray-white matter differentiation throughout the brain. No cortically based acute infarct identified. Vascular: Calcified atherosclerosis at the skull base. Skull: No acute osseous abnormality identified. Sinuses/Orbits: New left maxillary sinus fluid level and mucosal thickening. The nasal cavity and nasopharynx now all are opacified. Heterogeneous gas at the anterior nasal septum appears to occupy an area of chronic septal erosion. Other Visualized paranasal sinuses and mastoids are stable and well pneumatized. Other: No scalp hematoma. Orbits soft tissues appear stable. Aside from the pharynx opacification negative visualized noncontrast deep soft tissue spaces of the face. IMPRESSION: 1. Stable CT appearance of the brain since  1102 hours today. No acute intracranial abnormality. 2. Fluid now throughout the nasal cavity and visible pharynx in the setting of intubation. Electronically Signed   By: Genevie Ann M.D.   On: 05/31/2016 16:37   Ct Head Wo Contrast  Result Date: 05/31/2016 CLINICAL DATA:  51 year old female with right side weakness. Code stroke. Initial encounter. EXAM: CT HEAD WITHOUT CONTRAST TECHNIQUE: Contiguous axial images were obtained from the base of the skull through the vertex without intravenous contrast. COMPARISON:  Head CT 02/25/2016 FINDINGS: Brain: Gray-white matter differentiation appears stable and within normal limits throughout the brain. No cortically based acute infarct identified. No acute intracranial hemorrhage identified. No midline shift, mass effect, or evidence of intracranial mass lesion. No ventriculomegaly. Vascular: Calcified atherosclerosis at the skull base. No suspicious intracranial vascular hyperdensity. Skull: No acute osseous abnormality identified. Sinuses/Orbits: Mild mucosal thickening in the posterior left maxillary sinus. Other Visualized paranasal sinuses and mastoids are stable and well pneumatized. Other: Orbit and scalp soft tissues appear stable and negative. ASPECTS  score = 10 [place] Stroke Program Early CT Score Normal score = 10 IMPRESSION: 1. Stable and normal Normal noncontrast CT appearance of the brain. 2. ASPECTS 10. 3. The above was relayed via text pager to Dr. Roland Rack on 05/31/2016 at 11:09 . Electronically Signed   By: Genevie Ann M.D.   On: 05/31/2016 11:09   Ct Angio Neck W Or Wo Contrast  Result Date: 05/31/2016 CLINICAL DATA:  51 year old female code stroke. Right side weakness. Initial encounter. EXAM: CT ANGIOGRAPHY HEAD AND NECK TECHNIQUE: Multidetector CT imaging of the head and neck was performed using the standard protocol during bolus administration of intravenous contrast. Multiplanar CT image reconstructions and MIPs were obtained to evaluate the  vascular anatomy. Carotid stenosis measurements (when applicable) are obtained utilizing NASCET criteria, using the distal internal carotid diameter as the denominator. CONTRAST:  50 mL Isovue 370 COMPARISON:  Noncontrast head CT 1102 hours today. FINDINGS: CTA NECK Skeleton: No acute osseous abnormality identified. Mild for age spine degeneration. Upper chest: Negative lung apices. No superior mediastinal lymphadenopathy. There is a left internal jugular approach dual lumen dialysis type catheter in place. The visible SVC is patent. Other neck: Subcentimeter left thyroid lobe and isthmus nodules which do not meet size criteria for ultrasound follow-up. Negative larynx, pharynx, parapharyngeal spaces, retropharyngeal space, sublingual space, submandibular glands and parotid glands. No lymphadenopathy. Aortic arch: 3 vessel arch configuration. Mild arch and proximal great vessels soft and calcified atherosclerotic plaque. Right carotid system: No brachiocephalic artery or right CCA origin stenosis despite some plaque. Mild circumferential soft plaque in the right CCA. Mostly calcified plaque at the right carotid bifurcation which is mild and results in less than 50 % stenosis with respect to the distal vessel. Somewhat non dominant appearing right ICA. Left carotid system: No left CCA origin stenosis despite soft and calcified plaque. Minimal soft and calcified plaque elsewhere in the left CCA. At the left carotid bifurcation there is combination soft and calcified plaque resulting in a short segment of high-grade stenosis estimated at 70 % stenosis with respect to the distal vessel (series 402, images 124n 125). Calcified plaque continues into the left ICA bulb but there is no additional cervical left ICA stenosis. Vertebral arteries: Calcified and soft plaque at the right subclavian artery origin with no hemodynamically significant stenosis. Normal right vertebral artery origin. Normal cervical right vertebral  artery. No proximal left subclavian artery stenosis despite soft and calcified plaque. There is soft plaque at the left vertebral artery origin with mild or at most moderate stenosis (series 403, image 138). Mild left V2 segment calcified plaque without stenosis. The left vertebral artery is mildly non dominant. CTA HEAD Posterior circulation: Right V4 segment calcified plaque with mild distal right vertebral artery stenosis. Patent vertebrobasilar junction. Combined soft and calcified left V4 segment plaque with similar mild stenosis. Normal left vertebrobasilar junction. Both a ICAs appear dominant. No basilar stenosis. Normal SCA and PCA origins. Diminutive posterior communicating arteries. Bilateral PCA branches are within normal limits. Anterior circulation: Both ICA siphons are patent. There is moderate to severe bilateral siphon calcified plaque worse on the right. On the left side there is moderate cavernous left ICA stenosis (series 404, images 129 in 04/1928. On the right there is moderate to severe stenosis at both the proximal and distal cavernous segments (series 402, image 239). Normal ophthalmic artery origins. Carotid termini are patent. The left A1 segment is dominant and the right A1 is diminutive or absent. Anterior communicating artery and bilateral  ACA branches are within normal limits. Right MCA M1 segment, right MCA bifurcation, and right MCA branches are within normal limits. The left MCA M1 segment is patent and bifurcates early. There is a patent left anterior temporal artery branch on series 404, image 133. The left MCA bifurcation then is patent. No left MCA branch occlusion or significant stenosis is identified. Venous sinuses: Patent. Anatomic variants: Mildly dominant right vertebral artery. Mildly dominant left ICA, with dominant left and diminutive or absent right ACA A1 segments. Review of the MIP images confirms the above findings IMPRESSION: 1. Negative for emergent large vessel  occlusion. This was preliminarily discussed with Dr. Roland Rack via telephone at 1141 hours. 2. 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 3. There is also extensive ICA siphon calcified plaque with moderate left and moderate to severe right ICA siphon stenosis. 4. Mildly dominant right vertebral artery. Mild bilateral V4 segment vertebral artery stenosis, with mild to moderate stenosis at the origin of the left vertebral artery. 5. Left IJ approach dual lumen dialysis type catheter in place. Electronically Signed   By: Genevie Ann M.D.   On: 05/31/2016 11:56   Mr Brain Wo Contrast  Result Date: 05/31/2016 CLINICAL DATA:  Acute onset of altered mental status. Seizure-like activity. EXAM: MRI HEAD WITHOUT CONTRAST TECHNIQUE: Multiplanar, multiecho pulse sequences of the brain and surrounding structures were obtained without intravenous contrast. COMPARISON:  CT head without contrast 05/31/2016 FINDINGS: Brain: The diffusion-weighted images demonstrate no evidence for acute or subacute infarction. Mild age advanced atrophy and white matter changes are present bilaterally. T2 changes are present in the brainstem. A remote infarct is evident within the left middle cerebellar peduncle. Additional remote lacunar infarcts are present in the left thalamus. A remote punctate lacunar infarct is present in the inferior right cerebellum. Dedicated imaging of the temporal lobes demonstrate symmetric size and signal of the hippocampal structures. Vascular: Flow is present in the major intracranial arteries. Skull and upper cervical spine: The patient is intubated. Skullbase is otherwise within normal limits. Fluid is present in the nasopharynx. Craniocervical junction is normal. Midline sagittal structures are unremarkable. Sinuses/Orbits: A fluid level is present in the left maxillary sinus. There is diffuse mucosal thickening in the ethmoid air cells, sphenoid  sinuses, and right maxillary sinus. The frontal sinuses are clear bilaterally. Minimal mastoid fluid is present bilaterally. IMPRESSION: 1. No acute intracranial abnormality to explain 7 not altered mental status. 2. Remote lacunar infarcts within the posterior circulation as described. 3. Mild generalized atrophy and white matter disease is advanced for age. Electronically Signed   By: San Morelle M.D.   On: 05/31/2016 20:29   Dg Chest Port 1 View  Result Date: 06/02/2016 CLINICAL DATA:  Acute respiratory failure EXAM: PORTABLE CHEST 1 VIEW COMPARISON:  06/01/2016 FINDINGS: Endotracheal tube is in place with tip approximately 4.5 cm above the carina. Nasogastric tube is in place, tip beyond the edge of the image beyond the gastroesophageal junction. Left dual lumen catheter tip overlies the level of the superior vena cava-right atrium. The heart is enlarged. Possible mild infiltrate or atelectasis in the medial left lung base. No pulmonary edema. IMPRESSION: 1. Lines and tubes appear unchanged. 2. Left lower lobe atelectasis or early infiltrate. Electronically Signed   By: Nolon Nations M.D.   On: 06/02/2016 07:57   Dg Chest Port 1 View  Result Date: 06/01/2016 CLINICAL DATA:  Respiratory failure. EXAM: PORTABLE CHEST 1 VIEW COMPARISON:  Radiographs  of May 31, 2016. FINDINGS: Endotracheal and nasogastric tubes are unchanged in position. Left internal jugular dialysis catheter is unchanged. No pneumothorax or pleural effusion is noted. Mild bibasilar subsegmental atelectasis is noted. Bony thorax is unremarkable. IMPRESSION: Mild bibasilar subsegmental atelectasis.  Stable support apparatus. Electronically Signed   By: Marijo Conception, M.D.   On: 06/01/2016 08:29   Dg Chest Portable 1 View  Result Date: 05/31/2016 CLINICAL DATA:  Post intubation EXAM: PORTABLE CHEST 1 VIEW COMPARISON:  05/18/2016 FINDINGS: Cardiomegaly again noted. Double lumen left IJ catheter is unchanged in position. There  is endotracheal tube in place with tip 2.7 cm above the carina. NG tube in place. The tip of the NG tube is not included on the film. Central mild vascular congestion without convincing pulmonary edema. Mild right basilar atelectasis. No segmental infiltrate. IMPRESSION: Double lumen left IJ catheter is unchanged in position. There is endotracheal tube in place with tip 2.7 cm above the carina. NG tube in place. The tip of the NG tube is not included on the film. Central mild vascular congestion without convincing pulmonary edema. Mild right basilar atelectasis. No segmental infiltrate. Electronically Signed   By: Lahoma Crocker M.D.   On: 05/31/2016 15:57   Dg Fluoro Guide Lumbar Puncture  Result Date: 05/31/2016 CLINICAL DATA:  51 year old female with altered mental status, right side weakness. Code stroke workup thus far today is negative for emergent large vessel occlusion or CT evidence of cortically based infarct. Blood cultures are now positive for gram positive cocci. Diagnostic lumbar puncture requested. Initial encounter. EXAM: DIAGNOSTIC LUMBAR PUNCTURE UNDER FLUOROSCOPIC GUIDANCE FLUOROSCOPY TIME:  Fluoroscopy Time:  0 minutes 6 seconds Radiation Exposure Index (if provided by the fluoroscopic device): Number of Acquired Spot Images: 0 PROCEDURE: Informed consent was obtained from the patient's daughter prior to the procedure, including potential complications of headache, allergy, and pain. A "time-out" was performed. With the patient prone, the lower back was prepped with Betadine. 1% Lidocaine was used for local anesthesia. Lumbar puncture was performed at the L3-L4 level using left sub laminar technique and a 3.5 inch x 20 gauge needle with return of clear CSF with an opening pressure of 18 cm of water. 16 mL of CSF were obtained for laboratory studies. The patient tolerated the procedure well and there were no apparent complications. IMPRESSION: Diagnostic lumbar puncture at L3-L4. Clear CSF with  opening pressure of 18 cm of water. 16 mL of CSF obtained for laboratory studies. Electronically Signed   By: Genevie Ann M.D.   On: 05/31/2016 13:50   Medications: Infusions: . dexmedetomidine 0.4 mcg/kg/hr (06/02/16 0622)    Scheduled Medications: . amLODipine  10 mg Per Tube Daily  . chlorhexidine gluconate (MEDLINE KIT)  15 mL Mouth Rinse BID  . Chlorhexidine Gluconate Cloth  6 each Topical q morning - 10a  . heparin  5,000 Units Subcutaneous Q8H  . insulin aspart  0-20 Units Subcutaneous Q4H  . levETIRAcetam  1,000 mg Intravenous Daily  . levETIRAcetam  500 mg Per Tube Q T,Th,Sat-1800  . lisinopril  10 mg Per Tube Daily  . mouth rinse  15 mL Mouth Rinse 10 times per day  . metoprolol tartrate  50 mg Per Tube BID  . pantoprazole sodium  40 mg Per Tube QHS    have reviewed scheduled and prn medications.  Physical Exam: General: sedated on vent Heart: RRR Lungs: CBS bilat Abdomen: obese, soft Extremities: pitting edema Dialysis Access: left sided PC  06/02/2016,8:17 AM  LOS: 2 days

## 2016-06-02 NOTE — Progress Notes (Signed)
Silver ring removed from pt right hand. Ring placed in pink denture cup with pt's sticker on it. Will pass along info to oncoming nurse.  Roselie Awkward, RN

## 2016-06-02 NOTE — Progress Notes (Signed)
Pharmacy Antibiotic Note  Kimberly Montgomery is a 51 y.o. female admitted on 05/31/2016 with bacteremia.  CONS in bcx but has chronic HD line  Recent 1 + bcx in feb  Plan: Add Vanc 2 g x 1 Vanc 1 g with HD (scheduled for Mon then regular TTS Schedule) Watch HD schedule Repeat bcx Mon (ordered ensure they are drawn)- d/c vanc if neg  Height: 5\' 9"  (175.3 cm) Weight: 207 lb 12.8 oz (94.3 kg) IBW/kg (Calculated) : 66.2  Temp (24hrs), Avg:99.4 F (37.4 C), Min:98.7 F (37.1 C), Max:100.1 F (37.8 C)   Recent Labs Lab 05/31/16 1030 05/31/16 1102 06/01/16 0226 06/02/16 0241  WBC 6.4  --  10.2 8.9  CREATININE 4.01* 4.00* 4.40* 4.24*    Estimated Creatinine Clearance: 19 mL/min (by C-G formula based on SCr of 4.24 mg/dL (H)).    Allergies  Allergen Reactions  . Cephalosporins Anaphylaxis    Patient has tolerated meropenem.   [name] Penicillins Anaphylaxis and Other (See Comments)    Has patient had a PCN reaction causing immediate rash, facial/tongue/throat swelling, SOB or lightheadedness with hypotension: Yes Has patient had a PCN reaction causing severe rash involving mucus membranes or skin necrosis: No Has patient had a PCN reaction that required hospitalization No Has patient had a PCN reaction occurring within the last 10 years: No If all of the above answers are "NO", then may proceed with Cephalosporin use.  . Lamictal [Lamotrigine] Other (See Comments)    Reaction:  Hallucinations  . Phenergan [Promethazine Hcl] Nausea And Vomiting  . Pravastatin Other (See Comments)    Reaction:  Muscle pain   . Sulfa Antibiotics Other (See Comments)    Reaction:  Unknown    Isaac Bliss, PharmD, BCPS, BCCCP Clinical Pharmacist Clinical phone for 06/02/2016 from 7a-3:30p: [phone] If after 3:30p, please call main pharmacy at: [phone] 06/02/2016 10:55 AM

## 2016-06-02 NOTE — Progress Notes (Signed)
A tiny circular rash on bilateral (dorsal side) hands was noted. Elink nurse was notified. Will continue to assess. No new orders given at this time.

## 2016-06-02 NOTE — Progress Notes (Signed)
eLink Physician-Brief Progress Note Patient Name: Kimberly Montgomery DOB: 1965-09-21 MRN: [id number]   Date of Service  06/02/2016  HPI/Events of Note  Patient had been taken off fentanyl gtt to allow for evaluation of mental status.  Now just on limited PRN fentanyl/versed.  Patient now shaking uncontrollably, hypertensive.  Does not follow commands but does open eyes spontaneously  eICU Interventions  Plan: Will try precedex gtt for sedation     Intervention Category Major Interventions: Change in mental status - evaluation and management  Aaditya Letizia 06/02/2016, 5:47 AM

## 2016-06-03 ENCOUNTER — Inpatient Hospital Stay (HOSPITAL_COMMUNITY): Payer: Medicare HMO

## 2016-06-03 ENCOUNTER — Other Ambulatory Visit: Payer: Medicare HMO

## 2016-06-03 ENCOUNTER — Inpatient Hospital Stay: Admission: RE | Admit: 2016-06-03 | Payer: Medicare HMO | Source: Ambulatory Visit

## 2016-06-03 DIAGNOSIS — R569 Unspecified convulsions: Secondary | ICD-10-CM

## 2016-06-03 DIAGNOSIS — G9340 Encephalopathy, unspecified: Secondary | ICD-10-CM

## 2016-06-03 DIAGNOSIS — R251 Tremor, unspecified: Secondary | ICD-10-CM

## 2016-06-03 DIAGNOSIS — I679 Cerebrovascular disease, unspecified: Secondary | ICD-10-CM

## 2016-06-03 LAB — CSF CULTURE W GRAM STAIN

## 2016-06-03 LAB — VDRL, CSF: VDRL Quant, CSF: NONREACTIVE

## 2016-06-03 LAB — BASIC METABOLIC PANEL
ANION GAP: 15 (ref 5–15)
BUN: 39 mg/dL — ABNORMAL HIGH (ref 6–20)
CALCIUM: 10.3 mg/dL (ref 8.9–10.3)
CO2: 25 mmol/L (ref 22–32)
Chloride: 97 mmol/L — ABNORMAL LOW (ref 101–111)
Creatinine, Ser: 5.29 mg/dL — ABNORMAL HIGH (ref 0.44–1.00)
GFR, EST AFRICAN AMERICAN: 10 mL/min — AB (ref >60)
GFR, EST NON AFRICAN AMERICAN: 8 mL/min — AB (ref >60)
Glucose, Bld: 118 mg/dL — ABNORMAL HIGH (ref 65–99)
Potassium: 4.2 mmol/L (ref 3.5–5.1)
Sodium: 137 mmol/L (ref 135–145)

## 2016-06-03 LAB — GLUCOSE, CAPILLARY
GLUCOSE-CAPILLARY: 125 mg/dL — AB (ref 65–99)
Glucose-Capillary: 118 mg/dL — ABNORMAL HIGH (ref 65–99)
Glucose-Capillary: 124 mg/dL — ABNORMAL HIGH (ref 65–99)
Glucose-Capillary: 124 mg/dL — ABNORMAL HIGH (ref 65–99)
Glucose-Capillary: 130 mg/dL — ABNORMAL HIGH (ref 65–99)
Glucose-Capillary: 136 mg/dL — ABNORMAL HIGH (ref 65–99)
Glucose-Capillary: 144 mg/dL — ABNORMAL HIGH (ref 65–99)

## 2016-06-03 LAB — CBC
HCT: 34.5 % — ABNORMAL LOW (ref 36.0–46.0)
Hemoglobin: 11.1 g/dL — ABNORMAL LOW (ref 12.0–15.0)
MCH: 28.2 pg (ref 26.0–34.0)
MCHC: 32.2 g/dL (ref 30.0–36.0)
MCV: 87.8 fL (ref 78.0–100.0)
PLATELETS: 306 10*3/uL (ref 150–400)
RBC: 3.93 MIL/uL (ref 3.87–5.11)
RDW: 14.7 % (ref 11.5–15.5)
WBC: 7.9 10*3/uL (ref 4.0–10.5)

## 2016-06-03 LAB — CULTURE, RESPIRATORY W GRAM STAIN: Culture: NORMAL

## 2016-06-03 LAB — ECHOCARDIOGRAM COMPLETE
Height: 69 in
Weight: 3389.79 oz

## 2016-06-03 LAB — CSF CULTURE: CULTURE: NO GROWTH

## 2016-06-03 MED ORDER — METOPROLOL TARTRATE 50 MG PO TABS
50.0000 mg | ORAL_TABLET | Freq: Two times a day (BID) | ORAL | Status: DC
  Filled 2016-06-03: qty 1

## 2016-06-03 MED ORDER — VANCOMYCIN HCL IN DEXTROSE 1-5 GM/200ML-% IV SOLN
INTRAVENOUS | Status: AC
  Administered 2016-06-03: 1000 mg via INTRAVENOUS
  Filled 2016-06-03: qty 200

## 2016-06-03 MED ORDER — FENTANYL CITRATE (PF) 100 MCG/2ML IJ SOLN
25.0000 ug | INTRAMUSCULAR | Status: DC | PRN
  Administered 2016-06-03: 25 ug via INTRAVENOUS
  Administered 2016-06-03 – 2016-06-04 (×4): 50 ug via INTRAVENOUS
  Filled 2016-06-03 (×5): qty 2

## 2016-06-03 MED ORDER — ALBUMIN HUMAN 25 % IV SOLN
25.0000 g | Freq: Once | INTRAVENOUS | Status: AC
  Administered 2016-06-03: 25 g via INTRAVENOUS

## 2016-06-03 MED ORDER — LEVETIRACETAM 100 MG/ML PO SOLN
500.0000 mg | Freq: Once | ORAL | Status: DC
  Filled 2016-06-03: qty 5

## 2016-06-03 MED ORDER — LEVETIRACETAM 100 MG/ML PO SOLN
500.0000 mg | ORAL | Status: DC
  Administered 2016-06-04 – 2016-06-08 (×3): 500 mg via ORAL
  Filled 2016-06-03 (×5): qty 5

## 2016-06-03 MED ORDER — AMLODIPINE BESYLATE 10 MG PO TABS
10.0000 mg | ORAL_TABLET | Freq: Every day | ORAL | Status: DC
  Administered 2016-06-04 – 2016-06-11 (×7): 10 mg via ORAL
  Filled 2016-06-03 (×7): qty 1

## 2016-06-03 MED ORDER — HYDRALAZINE HCL 25 MG PO TABS
25.0000 mg | ORAL_TABLET | Freq: Every day | ORAL | Status: DC
  Administered 2016-06-04 – 2016-06-06 (×3): 25 mg via ORAL
  Filled 2016-06-03 (×4): qty 1

## 2016-06-03 MED ORDER — METOPROLOL TARTRATE 50 MG PO TABS
50.0000 mg | ORAL_TABLET | Freq: Two times a day (BID) | ORAL | Status: DC
  Administered 2016-06-05: 50 mg via ORAL
  Filled 2016-06-03 (×4): qty 1

## 2016-06-03 MED ORDER — ISOSORBIDE MONONITRATE ER 60 MG PO TB24
90.0000 mg | ORAL_TABLET | Freq: Every day | ORAL | Status: DC
  Administered 2016-06-04 – 2016-06-11 (×7): 90 mg via ORAL
  Filled 2016-06-03 (×7): qty 1

## 2016-06-03 MED ORDER — LISINOPRIL 10 MG PO TABS
10.0000 mg | ORAL_TABLET | Freq: Every day | ORAL | Status: DC
  Administered 2016-06-04 – 2016-06-11 (×7): 10 mg via ORAL
  Filled 2016-06-03 (×8): qty 1

## 2016-06-03 MED ORDER — CLOPIDOGREL BISULFATE 75 MG PO TABS
75.0000 mg | ORAL_TABLET | Freq: Every day | ORAL | Status: DC
  Administered 2016-06-04 – 2016-06-11 (×7): 75 mg via ORAL
  Filled 2016-06-03 (×8): qty 1

## 2016-06-03 MED ORDER — HEPARIN SODIUM (PORCINE) 1000 UNIT/ML DIALYSIS
20.0000 [IU]/kg | INTRAMUSCULAR | Status: DC | PRN
  Filled 2016-06-03: qty 2

## 2016-06-03 MED ORDER — LABETALOL HCL 5 MG/ML IV SOLN: 10.0000 mg | INTRAVENOUS | Status: DC | PRN

## 2016-06-03 MED ORDER — PANTOPRAZOLE SODIUM 40 MG PO TBEC
40.0000 mg | DELAYED_RELEASE_TABLET | Freq: Every day | ORAL | Status: DC
  Administered 2016-06-04: 40 mg via ORAL
  Filled 2016-06-03: qty 1

## 2016-06-03 MED ORDER — PERFLUTREN LIPID MICROSPHERE
1.0000 mL | INTRAVENOUS | Status: AC | PRN
  Administered 2016-06-03: 3 mL via INTRAVENOUS
  Filled 2016-06-03: qty 10

## 2016-06-03 MED ORDER — ALBUMIN HUMAN 25 % IV SOLN
INTRAVENOUS | Status: AC
  Administered 2016-06-03: 25 g via INTRAVENOUS
  Filled 2016-06-03: qty 100

## 2016-06-03 NOTE — Progress Notes (Deleted)
HPI: 51yo F with PMH seizure, ESRD-HD TTS, DM, CAD CHF, HTN who presented to the ED 3/2 with seizure and AMS  AC: hep SQ  ID: 1/2 BCx MRSE. 3/5 ECHO cannot r/o MV vegetation. WBC wnl, afeb Antimicrobials this admission:  3/2 meropenem x1 3/2 vancomycin >>  Microbiology results:  3/2 CSF: negative 3/2 BCx: 1/2 MRSE 3/2 MRSA PCR: negative 3/2 TA: normal resp flora 3/5 BCx: ngtd  CV: SBP 140s-190s on lisinopril, lopressor, amlodipine, hydral, imdur Plavix  Endo: CBGs wnl on sSSI  GI/Nutr: dysphagia diet. PPI, lactulose, miralax  Neuro: keppra for seizures  Renal: ESRD on HD TTS - on schedule except extra session on 3/5. Last HD 3/7 for 3.5h @ BFR 350. K 3.7, Phos 4.3, CorCa 10  Heme/Onc: Hgb 9.7, Plt wnl PTA EPO 6000 units TIW, iron 100mg  BIW Pulm: intubated >> extubated 3/5  PTA med issues: Tums, Lantus 8, wellbutrin, gabapentin, reglan, rena-vit, risperdal, renvela, torsemide, artane Best practices: MC, hep, PPI  Plan: Pt was not supposed to get vanc dose 3/6, but it was given Hold vancomycin 1g IV with HD-TTS Pre-HD level 3/8 Follow HD schedule F/u repeat blood culture, TEE Monitor BP - increase hydral Monitor Phos -  resume renvela prn Monitor Hgb - start ESA?

## 2016-06-03 NOTE — Progress Notes (Signed)
eLink Physician-Brief Progress Note Patient Name: RAYETTA TALLUTO DOB: [birth year] MRN: [id number]   Date of Service  06/03/2016  HPI/Events of Note  HTN, NPO HR 85  eICU Interventions  Add IV labetolol prn     Intervention Category Intermediate Interventions: Hypertension - evaluation and management  Nelda Bucks. 06/03/2016, 10:10 PM

## 2016-06-03 NOTE — Progress Notes (Signed)
PCCM Progress Note  Admission date: 05/31/2016 Referring provider: Dr. Vinnie Level, ER  CC: Altered mental status  HPI: 51 yo presented with altered mental status and seizure with HTN emergency (SBP 240).  Intubated for airway protection.  Had fever.  She has hx of seizures, ESRD on HD, DM, CHF.  Subjective: More awake, tolerating SBT.  Vital signs: BP (!) 185/150   Pulse 81   Temp 98.4 F (36.9 C) (Oral)   Resp 19   Ht 5\' 9"  (1.753 m)   Wt 211 lb 13.8 oz (96.1 kg)   LMP 03/16/2015   SpO2 100%   BMI 31.29 kg/m   Intake/output: I/O last 3 completed shifts: In: 1459.3 [I.V.:479.3; NG/GT:360; IV Piggyback:620] Out: 339 [Urine:239; Emesis/NG output:100]  General: alert Neuro: moves extremities, tremulous HEENT: ETT in place Cardiac: regular, no murmur Chest: no wheeze Abd: soft, non tender Ext: no edema Skin: no rashes   CMP Latest Ref Rng & Units 06/03/2016 06/02/2016 06/01/2016  Glucose 65 - 99 mg/dL 956(O) 130(Q) 657(Q)  BUN 6 - 20 mg/dL 46(N) 62(X) 52(W)  Creatinine 0.44 - 1.00 mg/dL 4.13(K) 4.40(N) 0.27(O)  Sodium 135 - 145 mmol/L 137 136 133(L)  Potassium 3.5 - 5.1 mmol/L 4.2 3.5 3.6  Chloride 101 - 111 mmol/L 97(L) 100(L) 94(L)  CO2 22 - 32 mmol/L 25 26 25   Calcium 8.9 - 10.3 mg/dL 53.6 64.4 03.4  Total Protein 6.5 - 8.1 g/dL - - -  Total Bilirubin 0.3 - 1.2 mg/dL - - -  Alkaline Phos 38 - 126 U/L - - -  AST 15 - 41 U/L - - -  ALT 14 - 54 U/L - - -     CBC Latest Ref Rng & Units 06/03/2016 06/02/2016 06/01/2016  WBC 4.0 - 10.5 K/uL 7.9 8.9 10.2  Hemoglobin 12.0 - 15.0 g/dL 11.1(L) 11.8(L) 11.1(L)  Hematocrit 36.0 - 46.0 % 34.5(L) 36.9 33.6(L)  Platelets 150 - 400 K/uL 306 273 236     ABG    Component Value Date/Time   PHART 7.532 (H) 06/01/2016 0403   PCO2ART 33.6 06/01/2016 0403   PO2ART 171.0 (H) 06/01/2016 0403   HCO3 28.2 (H) 06/01/2016 0403   TCO2 29 06/01/2016 0403   ACIDBASEDEF 6.4 (H) 07/21/2015 2143   O2SAT 100.0 06/01/2016 0403     CBG (last 3)    Recent Labs  06/02/16 2358 06/03/16 0352 06/03/16 0809  GLUCAP 125* 124* 124*     Imaging: Dg Chest Port 1 View  Result Date: 06/03/2016 CLINICAL DATA:  Acute respiratory failure, shortness of breath, history of coronary artery disease and CHF, previous MI, diabetes, former smoker. EXAM: PORTABLE CHEST 1 VIEW COMPARISON:  Portable chest x-ray of June 01, 2016 FINDINGS: The lungs are adequately inflated. The retrocardiac density on the left is mildly increase today. There is partial obscuration of the hemidiaphragm. The right lung is clear. The heart is normal in size. The pulmonary vascularity is not engorged. There is calcification in the wall of the aortic arch. The dual-lumen dialysis catheter tip projects over the cavoatrial junction. The endotracheal tube tip lies 6.1 cm above the carina. The esophagogastric tube tip projects below the inferior margin of the image. IMPRESSION: Mildly increased left basilar density suggests subsegmental atelectasis. A trace left pleural effusion may be present. There is no pulmonary edema. The support tubes are in stable position. Thoracic aortic atherosclerosis. Electronically Signed   By: David  [place] M.D.   On: 06/03/2016 07:10   Dg Chest Port 1  View  Result Date: 06/02/2016 CLINICAL DATA:  Acute respiratory failure EXAM: PORTABLE CHEST 1 VIEW COMPARISON:  06/01/2016 FINDINGS: Endotracheal tube is in place with tip approximately 4.5 cm above the carina. Nasogastric tube is in place, tip beyond the edge of the image beyond the gastroesophageal junction. Left dual lumen catheter tip overlies the level of the superior vena cava-right atrium. The heart is enlarged. Possible mild infiltrate or atelectasis in the medial left lung base. No pulmonary edema. IMPRESSION: 1. Lines and tubes appear unchanged. 2. Left lower lobe atelectasis or early infiltrate. Electronically Signed   By: Norva Pavlov M.D.   On: 06/02/2016 07:57   Dg Abd Portable 1 View  Result  Date: 06/02/2016 CLINICAL DATA:  Encounter for OG tube placement. EXAM: PORTABLE ABDOMEN - 1 VIEW COMPARISON:  None. FINDINGS: OG tube appropriately positioned in the stomach with tip directed towards the stomach pylorus/duodenal bulb. Visualized bowel gas pattern is nonobstructive. IMPRESSION: OG tube appears appropriately positioned in the stomach. Electronically Signed   By: Bary Richard M.D.   On: 06/02/2016 13:17     Studies: CT head 3/02 >> negative MRI brain 3/02 >> remote lacunar infarcts EEG 3/02 >> generalized slowing with triphasic waves LP 3/02 >> glucose 69, RBC 555, protein 43, WBC 6  Antibiotics: Meropenem 3/02 >> 3/02 Vancomycin 3/02 >>  Cultures: Blood 3/02 >> Coag neg Staph Urine 3/02 >. Sputum 3/02 >> CSF 3/02 >> Blood 3/04 >>   Lines/tubes: ETT 3/02 >> Lt [place] HD cath 3/04 >>  Events: 3/02 Admit 3/04 off cleviprix  Summary:  Assessment/plan:  Acute encephalopathy. Seizures. - monitor mental status - continue keppra per neurology  Acute hypoxic respiratory failure 2nd to compromised airway. - might be ready for extubation trial soon  HTN emergency. Hx of CAD, HLD. - BP improved - continue metoprolol, amlodipine, lisinopril - resume plavix, imdur, hydralazine,   ESRD. - HD per nephrology  Coag negative Staph bacteremia with fever. - day 4 of vancomycin - repeat TTE  DM. - SSI  Hx of gastroparesis. - hold outpt reglan  Hx of bipolar, depression, anxiety. - hold outpt neurontin, percocet, risperdal - d/c outpt wellbutrin  DVT prophylaxis - SQ heparin SUP - Protonix Nutrition - tube feeds Goals of care - Full code  Updated pt's daughter at bedside  CC time 33 minutes  Coralyn Helling, MD [facility] Pulmonary/Critical Care 06/03/2016, 10:17 AM Pager:  [phone] After 3pm call: [phone]

## 2016-06-03 NOTE — Progress Notes (Signed)
Dialysis treatment completed.  3500 mL ultrafiltrated.  3000 mL net fluid removal.  Patient status unchanged. Lung sounds coarse to ausculation in all fields. Generalized edema. Cardiac: NSR.  Cleansed LIJ catheter with chlorhexidine.  Disconnected lines and flushed ports with saline per protocol.  Ports locked with heparin and capped per protocol.    Report given to bedside, RN Abby.

## 2016-06-03 NOTE — Procedures (Signed)
Continuous Video-EEG Monitoring Report  Patient: Kimberly Montgomery, Kimberly Montgomery     EEG No.ID: [id number]     DOB: [birth year] Age: 51 Room#2M12  MED REC NO: [id number] Gender: Female   TECH: Amy Zwelling     Physician: Ronnie Doss     Referring Physician: Ritta Slot     Report Date: 06/03/2016       Study Duration:         06/02/2016 07:30 to 06/03/2016 07:30 CPT Code:                  50569 Diagnosis:                  Status epilepticus (G40.901)  History: This is a 51 year old female presenting with status epilepticus.  Video-EEG monitoring was performed to evaluate for seizures.  Technical Details:  Long-term video-EEG monitoring was performed using standard setting per the guidelines.  Briefly, a minimum of 21 electrodes were placed on scalp according to the International 10-20 or/and 10-10 Systems.  Supplemental electrodes were placed as needed.  Single EKG electrode was also used to detect cardiac arrhythmia.  Patient's behavior was continuously recorded on video simultaneously with EEG.  A minimum of 16 channels were used for data display.  Each epoch of study was reviewed manually daily and as needed using standard referential and bipolar montages.    EEG Description:  There was generalized polymorphic delta and theta slowing. Diffuse beta activity was also seen, likely due to medication effect (e.g., propofol, benzodiazepine). No posterior dominant rhythm or sleep architecture was present.  Intermittent generalized periodic discharges with triphasic morphology ("triphasic waves") were present, without any evolution or clinical correlates. The triphasic waves became more rhythmic upon external stimulation (such as nursing care).  No epileptiform discharges or seizures were in evidence. There were 7 "events" marked on the study.  During some of the events, intermittent right hand tremulous movement was seen on video but none of the events had EEG correlate to suggest  seizures.  Impression:  This is an abnormal EEGdue to the presence of the following: 1) Generalized polymorphic delta and theta slowing; 2) Intermittent triphasic waves. Taken together, these results suggest moderate to severe encephalopathy, most likely due to toxic-metabolic etiology, but medication effect could not be excluded.No epileptiform discharges or seizures were present.  Compared with the last epoch, current epoch showed overall faster background activity (i.e., less delta, more theta), suggesting interval improvement.    Reading Physician: Ronnie Doss, MD, PhD

## 2016-06-03 NOTE — Progress Notes (Signed)
LTM takedown; skin breakdown noted at F8 only.

## 2016-06-03 NOTE — Progress Notes (Signed)
Extubated to 4L nasal cannula. O2 saturations are 100% Heart rate is 86.

## 2016-06-03 NOTE — Progress Notes (Addendum)
[facility] Progress Note   Subjective: was just extubated, daughter at bedside  Vitals:   06/03/16 1200 06/03/16 1230 06/03/16 1300 06/03/16 1330  BP: (!) 165/65 (!) 161/63 (!) 157/60 (!) 155/63  Pulse: 84 81 81 80  Resp: 18 17 15 16   Temp:      TempSrc:      SpO2: 100% 100% 100% 100%  Weight:      Height:        Inpatient medications: . amLODipine  10 mg Oral Daily  . Chlorhexidine Gluconate Cloth  6 each Topical q morning - 10a  . clopidogrel  75 mg Oral Daily  . heparin  5,000 Units Subcutaneous Q8H  . hydrALAZINE  25 mg Oral Daily  . insulin aspart  0-20 Units Subcutaneous Q4H  . [START ON 06/04/2016] isosorbide mononitrate  90 mg Oral Daily  . levETIRAcetam  1,000 mg Intravenous Daily  . levETIRAcetam  500 mg Per Tube Q T,Th,Sat-1800  . levETIRAcetam  500 mg Per Tube Once  . lisinopril  10 mg Oral Daily  . metoprolol  50 mg Oral BID  . pantoprazole sodium  40 mg Per Tube QHS  . [START ON 06/04/2016] vancomycin  1,000 mg Intravenous Q T,Th,Sa-HD  . vancomycin  1,000 mg Intravenous Q M,W,F-HD    sodium chloride, sodium chloride, sodium chloride, alteplase, fentaNYL (SUBLIMAZE) injection, heparin, hydrALAZINE, lidocaine (PF), lidocaine-prilocaine, midazolam, pentafluoroprop-tetrafluoroeth, perflutren lipid microspheres (DEFINITY) IV suspension  Exam: Extubated, persistent tremors of head Eyes open, nods to questions No jvd Chest clear bilat RRR Abd obese, soft ntnd Ext +pitting edema Neuro is not talking, sig resting tremor of head shaking L IJ TDC  Dialysis: [facility] on [address]/ TTS 4h  108kg  2K bath  Hep 2000 then 900/hr   Permcath  - EPO 6000 tiw  - Iron 100 biw      Assessment: 1. Seizures - hx of seizures in the past. LP neg.  Head CT/ MRI neg. On Keppra, abx stopped. Per neuro.  2. ESRD HD TTS 3. HTN - on 4 BP meds at home (lisinopri/ metop/ hydral/ norvasc), on same regimen here 4. Volume - is down 12kg if wts are right,  BP's still high 5. Anemia of CKD - Hb good at 11  Plan - Extra HD today for vol and HD again tomorrow  Vinson Moselle MD [facility] pager [phone]   06/03/2016, 1:59 PM    Recent Labs Lab 06/01/16 0226 06/02/16 0241 06/03/16 0228  NA 133* 136 137  K 3.6 3.5 4.2  CL 94* 100* 97*  CO2 25 26 25   GLUCOSE 100* 116* 118*  BUN 39* 28* 39*  CREATININE 4.40* 4.24* 5.29*  CALCIUM 10.1 10.2 10.3  PHOS 4.2  --   --     Recent Labs Lab 05/31/16 1030  AST 15  ALT 11*  ALKPHOS 66  BILITOT 0.5  PROT 6.2*  ALBUMIN 3.1*    Recent Labs Lab 05/31/16 1030  06/01/16 0226 06/02/16 0241 06/03/16 0228  WBC 6.4  --  10.2 8.9 7.9  NEUTROABS 4.2  --   --   --   --   HGB 12.0  < > 11.1* 11.8* 11.1*  HCT 37.6  < > 33.6* 36.9 34.5*  MCV 88.7  --  86.4 88.9 87.8  PLT 195  --  236 273 306  < > = values in this interval not displayed. Iron/TIBC/Ferritin/ %Sat    Component Value Date/Time   IRON  24 (L) 07/12/2013 0509   TIBC 214 (L) 07/12/2013 0509   FERRITIN 183 07/12/2013 0509   IRONPCTSAT 11 07/12/2013 0509

## 2016-06-03 NOTE — Progress Notes (Signed)
eLink (MD) notified of R pupil being 3cm vs L pupil 2cm; both reactive & sluggish. No new orders at this time. Nursing will continue to monitor.

## 2016-06-03 NOTE — Progress Notes (Signed)
Echocardiogram 2D Echocardiogram has been performed with definity.  Kimberly Montgomery 06/03/2016, 1:49 PM

## 2016-06-03 NOTE — Progress Notes (Signed)
   06/03/16 1355  Clinical Encounter Type  Visited With Patient and family together  Visit Type Other (Comment) ([place] consult)  Spiritual Encounters  Spiritual Needs Emotional  Stress Factors  Patient Stress Factors Not reviewed  Family Stress Factors Family relationships  Introduction to family member as Pt medicated. Provided copy of AD and explained. Follow up tomorrow.

## 2016-06-03 NOTE — Progress Notes (Signed)
Arrived to patient room 47M-12 at 1446.  Reviewed treatment plan and this RN agrees with plan.  Report received from bedside RN, April.  Consent verified.  Patient Opens eyes to voice, oriented to self.   Lung sounds coarse to ausculation in all fields. Generalized edema. Cardiac:  NSR, Hb.  Removed caps and cleansed LIJ catheter with chlorhedxidine.  Aspirated ports of heparin and flushed them with saline per protocol.  Connected and secured lines, initiated treatment at 1515.  UF Goal of 3500 mL and net fluid removal 3 L.  Will continue to monitor.

## 2016-06-03 NOTE — Progress Notes (Signed)
Patient is from [facility]. CSW following for disposition.    Enos Fling, MSW, LCSW [facility] ED/37M Clinical Social Worker [phone]

## 2016-06-03 NOTE — Progress Notes (Signed)
Neurology Progress Note  Subjective: This morning, she had tremulousness, and her daughter was at bedside. She felt her mother had improved since yesterday. She complained of a back-sided headache but had been extubated 10 minutes prior to my exam. Her daughter recalls her mother having migraines since a child that respond well to Excedrin.   Medications reviewed and reconciled.   Pertinent meds: Levetiracetam   Current Meds:   Current Facility-Administered Medications:  .  0.9 %  sodium chloride infusion, 250 mL, Intravenous, PRN, Rahul P Desai, PA-C .  0.9 %  sodium chloride infusion, 100 mL, Intravenous, PRN, Annie Sable, MD .  0.9 %  sodium chloride infusion, 100 mL, Intravenous, PRN, Annie Sable, MD .  amLODipine (NORVASC) tablet 10 mg, 10 mg, Oral, Daily, Coralyn Helling, MD .  Chlorhexidine Gluconate Cloth 2 % PADS 6 each, 6 each, Topical, q morning - 10a, Alyson Reedy, MD, 6 each at 06/02/16 0700 .  clopidogrel (PLAVIX) tablet 75 mg, 75 mg, Oral, Daily, Coralyn Helling, MD .  fentaNYL (SUBLIMAZE) injection 25-50 mcg, 25-50 mcg, Intravenous, Q2H PRN, Coralyn Helling, MD, 50 mcg at 06/03/16 1256 .  heparin injection 5,000 Units, 5,000 Units, Subcutaneous, Q8H, Rahul P Desai, PA-C, 5,000 Units at 06/03/16 0555 .  hydrALAZINE (APRESOLINE) injection 10-40 mg, 10-40 mg, Intravenous, Q4H PRN, Cyril Mourning V, MD, 20 mg at 06/03/16 0907 .  hydrALAZINE (APRESOLINE) tablet 25 mg, 25 mg, Oral, Daily, Coralyn Helling, MD .  insulin aspart (novoLOG) injection 0-20 Units, 0-20 Units, Subcutaneous, Q4H, Rahul P Desai, PA-C, 3 Units at 06/03/16 1305 .  [START ON 06/04/2016] isosorbide mononitrate (IMDUR) 24 hr tablet 90 mg, 90 mg, Oral, Daily, Coralyn Helling, MD .  levETIRAcetam (KEPPRA) 1,000 mg in sodium chloride 0.9 % 100 mL IVPB, 1,000 mg, Intravenous, Daily, Rejeana Brock, MD, 1,000 mg at 06/03/16 0957 .  [START ON 06/04/2016] levETIRAcetam (KEPPRA) 100 MG/ML solution 500 mg, 500 mg, Oral, Q  T,Th,Sat-1800, Coralyn Helling, MD .  levETIRAcetam (KEPPRA) 100 MG/ML solution 500 mg, 500 mg, Oral, Once, Coralyn Helling, MD .  lisinopril (PRINIVIL,ZESTRIL) tablet 10 mg, 10 mg, Oral, Daily, Coralyn Helling, MD .  metoprolol (LOPRESSOR) tablet 50 mg, 50 mg, Oral, BID, Alyson Reedy, MD .  midazolam (VERSED) injection 2 mg, 2 mg, Intravenous, Q2H PRN, Sidney Ace, MD, 2 mg at 06/02/16 0433 .  pantoprazole (PROTONIX) EC tablet 40 mg, 40 mg, Oral, QHS, Vineet Sood, MD .  perflutren lipid microspheres (DEFINITY) IV suspension, 1-10 mL, Intravenous, PRN, Alyson Reedy, MD, 3 mL at 06/03/16 1330 .  [START ON 06/04/2016] vancomycin (VANCOCIN) IVPB 1000 mg/200 mL premix, 1,000 mg, Intravenous, Q T,Th,Sa-HD, Bertram Millard, RPH .  vancomycin (VANCOCIN) IVPB 1000 mg/200 mL premix, 1,000 mg, Intravenous, Q M,W,F-HD, Bertram Millard, RPH  Objective:  Temp:  [98.4 F (36.9 C)-99.9 F (37.7 C)] 99.4 F (37.4 C) (03/05 1157) Pulse Rate:  [52-86] 80 (03/05 1400) Resp:  [13-23] 16 (03/05 1400) BP: (106-211)/(51-150) 162/69 (03/05 1400) SpO2:  [99 %-100 %] 100 % (03/05 1400) FiO2 (%):  [30 %-36 %] 36 % (03/05 1039) Weight:  [211 lb 13.8 oz (96.1 kg)] 211 lb 13.8 oz (96.1 kg) (03/05 0400)  General: Middle-aged woman, Mumbles answers on questions. Follows some commands.  HEENT: Neck is supple without lymphadenopathy. Mucous membranes are moist and the oropharynx is clear. Sclerae are anicteric. There is no conjunctival injection.  CV: Regular, no murmur. Distal pulses 2+ and symmetric.  Lungs: CTAB  Extremities: S/p R BKA. Neuro: MS: As noted above.  CN: Pupils are equal and reactive from 3-->2 mm bilaterally. EOMI, no nystagmus. Facial sensation is intact to light touch. Face is symmetric at rest with normal strength and mobility. Hearing is intact to conversational voice. Tongue is midline with normal bulk and mobility. Unable to assess uvula or trapezius muscles.  Motor: Tremor affecting the proximal  over the distal musculature that is worse with activity.  Sensation: Intact to light touch, pinprick, vibration, and joint position.  DTRs: Hyporeflexic, symmetric. Toes are downgoing bilaterally. No pathological reflexes.  Coordination: Unable to follow commands.  Gait: Deferred   Labs: Lab Results  Component Value Date   WBC 7.9 06/03/2016   HGB 11.1 (L) 06/03/2016   HCT 34.5 (L) 06/03/2016   PLT 306 06/03/2016   GLUCOSE 118 (H) 06/03/2016   CHOL 215 (H) 11/19/2015   TRIG 186 (H) 11/19/2015   HDL 63 11/19/2015   LDLCALC 115 (H) 11/19/2015   ALT 11 (L) 05/31/2016   AST 15 05/31/2016   NA 137 06/03/2016   K 4.2 06/03/2016   CL 97 (L) 06/03/2016   CREATININE 5.29 (H) 06/03/2016   BUN 39 (H) 06/03/2016   CO2 25 06/03/2016   TSH 2.417 06/01/2016   INR 0.99 05/31/2016   HGBA1C 6.2 (H) 09/13/2015   CBC Latest Ref Rng & Units 06/03/2016 06/02/2016 06/01/2016  WBC 4.0 - 10.5 K/uL 7.9 8.9 10.2  Hemoglobin 12.0 - 15.0 g/dL 11.1(L) 11.8(L) 11.1(L)  Hematocrit 36.0 - 46.0 % 34.5(L) 36.9 33.6(L)  Platelets 150 - 400 K/uL 306 273 236    Lab Results  Component Value Date   HGBA1C 6.2 (H) 09/13/2015   Lab Results  Component Value Date   ALT 11 (L) 05/31/2016   AST 15 05/31/2016   ALKPHOS 66 05/31/2016   BILITOT 0.5 05/31/2016    A/P:  Ms. Melander is a 51 year old woman with ESRD on hemodialysis, seizure disorder hospitalized for acute encephalopathy from seizure activity complicated by ventilatory drive respiratory failure now s/p extubation with headache and tremor.   Seizure activity: Continue levetiracetam IV.   Headache disorder: Per daughter, she has migraine disorder. We will try heating pad though can dose acetaminophen thereafter.   Tremor: Suspect it is related to her underlying metabolic derangement. Worse with activity but symmetric in distribution. Continue observing.   The patient would likely benefit from acute rehab services. Recommend consultation of PT/OT with  consideration for PM&R consult as appropriate depending upon clinical progress and therapists' recommendations.   Fall risk: Yes. Risk factors for falls include age, prior history of falls, need for assistance with ADLs at baseline, use of psychotropic/sedative/hypnotic medications, polypharmacy, female gender, impaired mobility/gait, and orthopedic issues. Patient educated on risk of falls and use of call bell. Patient was counseled not to get up without assistance. Limit psychoactive medications and sedating medications.  Delirium risk: Yes. Risk factors for delirium include history of dementia/cognitive impairment, age, co-morbid illness, severity of medical illness, infection, medications/polypharmacy, impaired ADLs at baseline, immobility, sensory impairment (decreased vision, decreased hearing), metabolic derangement, decreased albumin, ICU admission, length of hospital stay. Continue to optimize metabolic status as you are. Continue to treat any underlying infection. Minimize the use of opiates, benzos or any medication with strong anticholinergic properties as much as possible. Optimize sleep-wake cycles as much as you can by keeping the room bright with activity during the day and dark and quiet at night.   Needs outpatient neurology follow-up?: Yes  This was discussed with the patient and her daughter. Education was provided on the diagnosis and expected evaluation and treatment. They are in agreement with the plan as noted. They were given the opportunity to ask any questions and these were addressed to their satisfaction.   Heywood Iles, PGY3 Internal Medicine Pager: [phone]

## 2016-06-04 LAB — RENAL FUNCTION PANEL
ANION GAP: 12 (ref 5–15)
Albumin: 3.5 g/dL (ref 3.5–5.0)
BUN: 22 mg/dL — ABNORMAL HIGH (ref 6–20)
CALCIUM: 9.8 mg/dL (ref 8.9–10.3)
CO2: 26 mmol/L (ref 22–32)
Chloride: 98 mmol/L — ABNORMAL LOW (ref 101–111)
Creatinine, Ser: 3.82 mg/dL — ABNORMAL HIGH (ref 0.44–1.00)
GFR calc non Af Amer: 13 mL/min — ABNORMAL LOW (ref >60)
GFR, EST AFRICAN AMERICAN: 15 mL/min — AB (ref >60)
Glucose, Bld: 123 mg/dL — ABNORMAL HIGH (ref 65–99)
PHOSPHORUS: 4.3 mg/dL (ref 2.5–4.6)
Potassium: 3.5 mmol/L (ref 3.5–5.1)
SODIUM: 136 mmol/L (ref 135–145)

## 2016-06-04 LAB — CBC
HCT: 35.6 % — ABNORMAL LOW (ref 36.0–46.0)
HEMOGLOBIN: 11.2 g/dL — AB (ref 12.0–15.0)
MCH: 28.4 pg (ref 26.0–34.0)
MCHC: 31.5 g/dL (ref 30.0–36.0)
MCV: 90.4 fL (ref 78.0–100.0)
Platelets: 255 10*3/uL (ref 150–400)
RBC: 3.94 MIL/uL (ref 3.87–5.11)
RDW: 14.6 % (ref 11.5–15.5)
WBC: 9.3 10*3/uL (ref 4.0–10.5)

## 2016-06-04 LAB — GLUCOSE, CAPILLARY
GLUCOSE-CAPILLARY: 114 mg/dL — AB (ref 65–99)
GLUCOSE-CAPILLARY: 204 mg/dL — AB (ref 65–99)
GLUCOSE-CAPILLARY: 141 mg/dL — AB (ref 65–99)
Glucose-Capillary: 135 mg/dL — ABNORMAL HIGH (ref 65–99)
Glucose-Capillary: 154 mg/dL — ABNORMAL HIGH (ref 65–99)

## 2016-06-04 LAB — CULTURE, BLOOD (ROUTINE X 2)

## 2016-06-04 MED ORDER — VANCOMYCIN HCL IN DEXTROSE 1-5 GM/200ML-% IV SOLN
1000.0000 mg | INTRAVENOUS | Status: DC
  Administered 2016-06-04: 1000 mg via INTRAVENOUS

## 2016-06-04 MED ORDER — INSULIN ASPART 100 UNIT/ML ~~LOC~~ SOLN: 0.0000 [IU] | Freq: Every day | SUBCUTANEOUS | Status: DC

## 2016-06-04 MED ORDER — INSULIN ASPART 100 UNIT/ML ~~LOC~~ SOLN
0.0000 [IU] | Freq: Three times a day (TID) | SUBCUTANEOUS | Status: DC
Start: 2016-06-04 — End: 2016-06-11
  Administered 2016-06-04: 1 [IU] via SUBCUTANEOUS
  Administered 2016-06-04: 3 [IU] via SUBCUTANEOUS
  Administered 2016-06-05: 1 [IU] via SUBCUTANEOUS
  Administered 2016-06-05: 2 [IU] via SUBCUTANEOUS
  Administered 2016-06-05: 1 [IU] via SUBCUTANEOUS
  Administered 2016-06-06: 3 [IU] via SUBCUTANEOUS
  Administered 2016-06-06 – 2016-06-09 (×5): 1 [IU] via SUBCUTANEOUS
  Administered 2016-06-09: 2 [IU] via SUBCUTANEOUS
  Administered 2016-06-10: 1 [IU] via SUBCUTANEOUS

## 2016-06-04 MED ORDER — OXYCODONE-ACETAMINOPHEN 5-325 MG PO TABS
1.0000 | ORAL_TABLET | Freq: Four times a day (QID) | ORAL | Status: DC | PRN
  Administered 2016-06-04 – 2016-06-05 (×3): 1 via ORAL
  Filled 2016-06-04 (×3): qty 1

## 2016-06-04 NOTE — Progress Notes (Signed)
   06/04/16 1430  Clinical Encounter Type  Visited With Patient and family together  Visit Type Follow-up  Spiritual Encounters  Spiritual Needs Emotional  Stress Factors  Patient Stress Factors Health changes  Family Stress Factors None identified  Completed AD paperwork. Notarized. Original and 2 copies for Pt. Copy to chart.

## 2016-06-04 NOTE — Clinical Social Work Note (Signed)
Clinical Social Work Assessment  Patient Details  Name: Kimberly Montgomery MRN: [id number] Date of Birth: [birth year]  Date of referral:  06/04/16               Reason for consult:  Facility Placement                Permission sought to share information with:  Family Supports Permission granted to share information::  Yes, Verbal Permission Granted  Name::     Santo Held (Daughter) [phone]  Agency::     Relationship::     Contact Information:     Housing/Transportation Living arrangements for the past 2 months:  Skilled Nursing Facility Source of Information:  Adult Children, Medical Team Patient Interpreter Needed:  None Criminal Activity/Legal Involvement Pertinent to Current Situation/Hospitalization:  No - Comment as needed Significant Relationships:  Adult Children Lives with:  Facility Resident Do you feel safe going back to the place where you live?  Yes Need for family participation in patient care:  Yes (Comment)  Care giving concerns:  Patient is a LTC resident of [facility] (Medicaid coverage). Patient and Patient's family report that they were very dissatisfied with care received at [facility] and do not want Patient to return. Patient's family does report that Patient will continue to need long term care at discharge.   Social Worker assessment / plan: Patient is a 51 YO female admitted post seizure with inability to protect airway. CSW engaged with Patient and [name] at Patient's bedside. CSW introduced self, role of CSW, and discussed discharge planning concerns. Patient and Patient's family report that they were very dissatisfied with care received at [facility] and do not want Patient to return. Patient's family does report that Patient will continue to need long term care at discharge. Patient reports that she lives in [place] and would like to be closer to home if at all possible but reports that she is also  agreeable to LTC SNF placement in [place] if necessary. CSW to facilitate discharge to LTC skilled nursing facility. CSW provided emotional support. CSW continues to follow.   Employment status:  Disabled (Comment on whether or not currently receiving Disability) Insurance information:  Managed Medicare PT Recommendations:  Not assessed at this time Information / Referral to community resources:  Skilled Nursing Facility  Patient/Family's Response to care:  Patient's family reports appreciation for care received at this time.   Patient/Family's Understanding of and Emotional Response to Diagnosis, Current Treatment, and Prognosis:  Patient and [name] verbalize understanding of diagnosis, current treatment, and prognosis. Patient and Patient's family report that they were very dissatisfied with care received at [facility] and do not want Patient to return. Patient's family does report that Patient will continue to need long term care at discharge. Patient's family does report that Patient will continue to need long term care at discharge.  Emotional Assessment Appearance:  Appears stated age Attitude/Demeanor/Rapport:  Unable to Assess Affect (typically observed):  Unable to Assess Orientation:  Oriented to Self, Oriented to Place, Oriented to Situation Alcohol / Substance use:  Not Applicable Psych involvement (Current and /or in the community):  No (Comment)  Discharge Needs  Concerns to be addressed:  Discharge Planning Concerns, Care Coordination Readmission within the last 30 days:  No Current discharge risk:  Chronically ill Barriers to Discharge:  Continued Medical Work up, Insurance Authorization   Lew Dawes, LCSW 06/04/2016, 12:11 PM

## 2016-06-04 NOTE — Progress Notes (Signed)
Neurology Progress Note  Subjective: This morning, she was more awake and alert than yesterday. Her daughter was at bedside. She was able to answer questions better and appeared less tremulousness. She also felt her headache had improved.  Medications reviewed and reconciled.   Pertinent meds: Levetiracetam IV   Current Meds:   Current Facility-Administered Medications:  .  0.9 %  sodium chloride infusion, 250 mL, Intravenous, PRN, Rahul P Desai, PA-C .  0.9 %  sodium chloride infusion, 100 mL, Intravenous, PRN, Annie Sable, MD .  0.9 %  sodium chloride infusion, 100 mL, Intravenous, PRN, Annie Sable, MD .  amLODipine (NORVASC) tablet 10 mg, 10 mg, Oral, Daily, Coralyn Helling, MD, 10 mg at 06/04/16 1344 .  Chlorhexidine Gluconate Cloth 2 % PADS 6 each, 6 each, Topical, q morning - 10a, Alyson Reedy, MD, 6 each at 06/04/16 0600 .  clopidogrel (PLAVIX) tablet 75 mg, 75 mg, Oral, Daily, Coralyn Helling, MD, 75 mg at 06/04/16 1212 .  fentaNYL (SUBLIMAZE) injection 25-50 mcg, 25-50 mcg, Intravenous, Q2H PRN, Coralyn Helling, MD, 50 mcg at 06/04/16 0725 .  heparin injection 1,900 Units, 20 Units/kg, Dialysis, PRN, Annie Sable, MD .  heparin injection 5,000 Units, 5,000 Units, Subcutaneous, Q8H, Rahul P Desai, PA-C, 5,000 Units at 06/04/16 1422 .  hydrALAZINE (APRESOLINE) injection 10-40 mg, 10-40 mg, Intravenous, Q4H PRN, Cyril Mourning V, MD, 20 mg at 06/04/16 0402 .  hydrALAZINE (APRESOLINE) tablet 25 mg, 25 mg, Oral, Daily, Coralyn Helling, MD, 25 mg at 06/04/16 1222 .  insulin aspart (novoLOG) injection 0-5 Units, 0-5 Units, Subcutaneous, QHS, Vineet Sood, MD .  insulin aspart (novoLOG) injection 0-9 Units, 0-9 Units, Subcutaneous, TID WC, Coralyn Helling, MD, 3 Units at 06/04/16 1229 .  isosorbide mononitrate (IMDUR) 24 hr tablet 90 mg, 90 mg, Oral, Daily, Coralyn Helling, MD, 90 mg at 06/04/16 1214 .  labetalol (NORMODYNE,TRANDATE) injection 10 mg, 10 mg, Intravenous, Q4H PRN, Nelda Bucks, MD .  levETIRAcetam (KEPPRA) 1,000 mg in sodium chloride 0.9 % 100 mL IVPB, 1,000 mg, Intravenous, Daily, Rejeana Brock, MD, 1,000 mg at 06/04/16 1232 .  levETIRAcetam (KEPPRA) 100 MG/ML solution 500 mg, 500 mg, Oral, Q T,Th,Sat-1800, Vineet Sood, MD .  lisinopril (PRINIVIL,ZESTRIL) tablet 10 mg, 10 mg, Oral, Daily, Coralyn Helling, MD, 10 mg at 06/04/16 1343 .  metoprolol (LOPRESSOR) tablet 50 mg, 50 mg, Oral, BID, Alyson Reedy, MD .  oxyCODONE-acetaminophen (PERCOCET/ROXICET) 5-325 MG per tablet 1 tablet, 1 tablet, Oral, Q6H PRN, Coralyn Helling, MD, 1 tablet at 06/04/16 1424 .  pantoprazole (PROTONIX) EC tablet 40 mg, 40 mg, Oral, QHS, Coralyn Helling, MD  Objective:  Temp:  [97.6 F (36.4 C)-99.2 F (37.3 C)] 97.6 F (36.4 C) (03/06 1607) Pulse Rate:  [63-86] 64 (03/06 1450) Resp:  [14-26] 15 (03/06 0830) BP: (94-184)/(45-74) 108/50 (03/06 1450) SpO2:  [88 %-100 %] 99 % (03/06 0830) Weight:  [198 lb (89.8 kg)-205 lb 4 oz (93.1 kg)] 198 lb (89.8 kg) (03/06 0411)  General:  Chronically ill appearing female. Alert, oriented to name. Speech is clear without dysarthria. HEENT: Neck is supple without lymphadenopathy. Mucous membranes are moist and the oropharynx is clear. Sclerae are anicteric. There is no conjunctival injection.  CV: Regular, no murmur.  Lungs: CTAB  Extremities: No C/C/E. Neuro: MS: As noted above.  CN: Pupils are equal and reactive from 3-->2 mm bilaterally. EOMI, no nystagmus. Facial sensation is intact to light touch. Face is symmetric at rest with normal strength and  mobility. Hearing is intact to conversational voice. Voice is normal in tone and quality. Palate elevates symmetrically. Uvula is midline. Bilateral SCM and trapezii are 5/5. Tongue is midline with normal bulk and mobility.  Motor: Normal bulk, tone, and strength throughout. Minor tremor noted with movement though improved from yesterday.  Sensation: Responds to tactile stimulation. DTRs:  Hyporeflexic, symmetric. Toes are downgoing of left foot.  Coordination: Unable to assess.  Gait: Deferred.    Labs: Lab Results  Component Value Date   WBC 9.3 06/04/2016   HGB 11.2 (L) 06/04/2016   HCT 35.6 (L) 06/04/2016   PLT 255 06/04/2016   GLUCOSE 123 (H) 06/04/2016   CHOL 215 (H) 11/19/2015   TRIG 186 (H) 11/19/2015   HDL 63 11/19/2015   LDLCALC 115 (H) 11/19/2015   ALT 11 (L) 05/31/2016   AST 15 05/31/2016   NA 136 06/04/2016   K 3.5 06/04/2016   CL 98 (L) 06/04/2016   CREATININE 3.82 (H) 06/04/2016   BUN 22 (H) 06/04/2016   CO2 26 06/04/2016   TSH 2.417 06/01/2016   INR 0.99 05/31/2016   HGBA1C 6.2 (H) 09/13/2015   CBC Latest Ref Rng & Units 06/04/2016 06/03/2016 06/02/2016  WBC 4.0 - 10.5 K/uL 9.3 7.9 8.9  Hemoglobin 12.0 - 15.0 g/dL 11.2(L) 11.1(L) 11.8(L)  Hematocrit 36.0 - 46.0 % 35.6(L) 34.5(L) 36.9  Platelets 150 - 400 K/uL 255 306 273    Lab Results  Component Value Date   HGBA1C 6.2 (H) 09/13/2015   Lab Results  Component Value Date   ALT 11 (L) 05/31/2016   AST 15 05/31/2016   ALKPHOS 66 05/31/2016   BILITOT 0.5 05/31/2016    A/P:  Ms. Waiters is a 51 year old woman with ESRD on hemodialysis, seizure disorder hospitalized for acute encephalopathy from seizure activity now s/p extubation with improvement in headache and tremor.   Seizure activity: Continue levetiracetam IV for now. No new activity noted.   Tremor: Improved from yesterday which reflects overall clinical improvement.   Headache disorder: Give acetaminophen as needed.   The patient would likely benefit from acute rehab services. Recommend consultation of PT/OT/SLP with consideration for PM&R consult as appropriate depending upon clinical progress and therapists' recommendations.   Fall risk: Risk factors for falls include age, need for assistance with ADLs at baseline, use of psychotropic/sedative/hypnotic medications, polypharmacy, female gender, impaired mobility/gait, visual  impairments, and orthopedic issues. Limit psychoactive medications and sedating medications.  Delirium risk: Risk factors for delirium include history of dementia/cognitive impairment, age, co-morbid illness, severity of medical illness, infection, medications/polypharmacy, impaired ADLs at baseline, immobility, sensory impairment (decreased vision, decreased hearing), metabolic derangement, decreased albumin, ICU admission, length of hospital stay. Continue to optimize metabolic status as you are. Continue to treat any underlying infection. Minimize the use of opiates, benzos or any medication with strong anticholinergic properties as much as possible. Optimize sleep-wake cycles as much as you can by keeping the room bright with activity during the day and dark and quiet at night.   Needs outpatient neurology follow-up?: Yes  This was discussed with the patient and daughter. Education was provided on the diagnosis and expected evaluation and treatment. They are in agreement with the plan as noted. They were given the opportunity to ask any questions and these were addressed to their satisfaction.   Heywood Iles, PGY3 Internal Medicine Pager: [phone]

## 2016-06-04 NOTE — Progress Notes (Signed)
[facility] Progress Note   Subjective: "i'm sick", " I don't feel good", no specific c/o  Vitals:   06/04/16 0730 06/04/16 0800 06/04/16 0819 06/04/16 0830  BP: (!) 164/63 (!) 179/71  (!) 180/69  Pulse: 82 83  83  Resp: 14 15  15   Temp:   98.6 F (37 C)   TempSrc:   Oral   SpO2: 99% 100%  99%  Weight:      Height:        Inpatient medications: . amLODipine  10 mg Oral Daily  . Chlorhexidine Gluconate Cloth  6 each Topical q morning - 10a  . clopidogrel  75 mg Oral Daily  . heparin  5,000 Units Subcutaneous Q8H  . hydrALAZINE  25 mg Oral Daily  . insulin aspart  0-5 Units Subcutaneous QHS  . insulin aspart  0-9 Units Subcutaneous TID WC  . isosorbide mononitrate  90 mg Oral Daily  . levETIRAcetam  1,000 mg Intravenous Daily  . levETIRAcetam  500 mg Oral Q T,Th,Sat-1800  . levETIRAcetam  500 mg Oral Once  . lisinopril  10 mg Oral Daily  . metoprolol  50 mg Oral BID  . pantoprazole  40 mg Oral QHS  . vancomycin  1,000 mg Intravenous Q T,Th,Sa-HD    sodium chloride, sodium chloride, sodium chloride, fentaNYL (SUBLIMAZE) injection, heparin, hydrALAZINE, labetalol  Exam: Mild tremors of head/ lips, better Slurred speech No jvd Chest clear bilat RRR Abd obese, soft ntnd Ext +pitting edema Neuro is awake, responds verbally, "Carey", doesn't know year L IJ TDC  Dialysis: [facility] on [address]/ TTS 4h  108kg  2K bath  Hep 2000 then 900/hr   Permcath  - EPO 6000 tiw  - Iron 100 biw      Assessment: 1. Seizures - hx of seizures in the past. LP neg.  Head CT/ MRI neg. Per neuro.  2. ESRD HD TTS 3. HTN - on 4 BP meds at home (lisinopri/ metop/ hydral/ norvasc), on same regimen here 4. Volume/ HTN  - lean body wt loss, dry wt lowered dramatically, BP's still high on 4 meds 5. Anemia of CKD - Hb good at 11 6. EPS - due to risperdal/ reglan, both have been dc'd, tremors improving  Plan - HD again today, cont to lower volume as BP  tolerates  Vinson Moselle MD [facility] pager [phone]   06/04/2016, 10:29 AM    Recent Labs Lab 06/01/16 0226 06/02/16 0241 06/03/16 0228 06/04/16 0311  NA 133* 136 137 136  K 3.6 3.5 4.2 3.5  CL 94* 100* 97* 98*  CO2 25 26 25 26   GLUCOSE 100* 116* 118* 123*  BUN 39* 28* 39* 22*  CREATININE 4.40* 4.24* 5.29* 3.82*  CALCIUM 10.1 10.2 10.3 9.8  PHOS 4.2  --   --  4.3    Recent Labs Lab 05/31/16 1030 06/04/16 0311  AST 15  --   ALT 11*  --   ALKPHOS 66  --   BILITOT 0.5  --   PROT 6.2*  --   ALBUMIN 3.1* 3.5    Recent Labs Lab 05/31/16 1030  06/02/16 0241 06/03/16 0228 06/04/16 0311  WBC 6.4  < > 8.9 7.9 9.3  NEUTROABS 4.2  --   --   --   --   HGB 12.0  < > 11.8* 11.1* 11.2*  HCT 37.6  < > 36.9 34.5* 35.6*  MCV 88.7  < > 88.9 87.8 90.4  PLT 195  < > 273 306 255  < > = values in this interval not displayed. Iron/TIBC/Ferritin/ %Sat    Component Value Date/Time   IRON 24 (L) 07/12/2013 0509   TIBC 214 (L) 07/12/2013 0509   FERRITIN 183 07/12/2013 0509   IRONPCTSAT 11 07/12/2013 0509

## 2016-06-04 NOTE — Progress Notes (Addendum)
Pharmacy Antibiotic Note  Kimberly Montgomery is a 51 y.o. female admitted on 05/31/2016 with bacteremia. Pharmacy has been consulted to dose vancomycin. Patient has ESRD on HD-TTS, received extra session on Monday 3/5. Blood culture grew MRSE in one bottle, repeat blood culture no growth to date. 3/5 ECHO cannot r/o MV vegetation. WBC wnl, afebrile.  Patient received loading doses on 3/2 and 3/4.   Plan: Will not give vancomycin with today's HD session Continue vancomycin 1g IV with HD-TTS (next dose 3/8) Follow HD schedule F/u repeat blood culture, TEE  Height: 5\' 9"  (175.3 cm) Weight: 198 lb (89.8 kg) IBW/kg (Calculated) : 66.2  Temp (24hrs), Avg:98.6 F (37 C), Min:97.6 F (36.4 C), Max:99.2 F (37.3 C)   Recent Labs Lab 05/31/16 1030 05/31/16 1102 06/01/16 0226 06/02/16 0241 06/03/16 0228 06/04/16 0311  WBC 6.4  --  10.2 8.9 7.9 9.3  CREATININE 4.01* 4.00* 4.40* 4.24* 5.29* 3.82*    Estimated Creatinine Clearance: 20.6 mL/min (by C-G formula based on SCr of 3.82 mg/dL (H)).    Antimicrobials this admission:  3/2 meropenem x1 3/2 vancomycin >>  Microbiology results:  3/2 CSF: negative 3/2 BCx: 1/2 MRSE 3/2 MRSA PCR: negative 3/2 TA: normal resp flora 3/5 BCx: ngtd   Mackie Pai, PharmD PGY1 Pharmacy Resident Pager: [phone] 06/04/2016 4:48 PM

## 2016-06-04 NOTE — Evaluation (Signed)
Clinical/Bedside Swallow Evaluation Patient Details  Name: Kimberly Montgomery MRN: [id number] Date of Birth: 04-27-1965  Today's Date: 06/04/2016 Time: SLP Start Time (ACUTE ONLY): 0815 SLP Stop Time (ACUTE ONLY): 0827 SLP Time Calculation (min) (ACUTE ONLY): 12 min  Past Medical History:  Past Medical History:  Diagnosis Date  . Anginal pain (HCC)   . Anxiety   . Bipolar disorder (HCC)   . CAD (coronary artery disease)   . CHF (congestive heart failure) (HCC)   . Chronic lower back pain   . Depression   . Endometriosis   . ESRD (end stage renal disease) on dialysis (HCC)    "[facility]; [address]; [place]; TTS" (01/19/2016)  . Gastroparesis   . GERD (gastroesophageal reflux disease)   . History of blood transfusion "several"   "my blood would get low; low RBC"  . History of hiatal hernia   . HLD (hyperlipidemia)   . Hypertension   . Migraine    "monthly" (01/19/2016)  . Myocardial infarction 2017   "~ 3 wks ago" (01/19/2016)  . Renal disorder   . Renal insufficiency   . Seizures (HCC) 07/2015   "I've only had the 1; don't know what from" (01/19/2016)  . Type II diabetes mellitus (HCC)    Past Surgical History:  Past Surgical History:  Procedure Laterality Date  . ABDOMINAL HYSTERECTOMY     "partial"  . BELOW KNEE LEG AMPUTATION Right 2010?  [name] CARDIAC CATHETERIZATION Right 07/10/2015   Procedure: Left Heart Cath and Coronary Angiography;  Surgeon: Laurier Nancy, MD;  Location: [facility] INV[facility]IVE CV [facility];  Service: Cardiovascular;  Laterality: Right;  . CARDIAC CATHETERIZATION Right 09/14/2015   Procedure: Left Heart Cath and Coronary Angiography;  Surgeon: Laurier Nancy, MD;  Location: [facility];  Service: Cardiovascular;  Laterality: Right;  . CARDIAC CATHETERIZATION N/A 09/14/2015   Procedure: Coronary Stent Intervention;  Surgeon: Alwyn Pea, MD;  Location: [facility];  Service: Cardiovascular;  Laterality: N/A;  . CARDIAC CATHETERIZATION Right  11/20/2015   Procedure: Left Heart Cath and Coronary Angiography;  Surgeon: Laurier Nancy, MD;  Location: [facility];  Service: Cardiovascular;  Laterality: Right;  . CORONARY ANGIOPLASTY WITH STENT PLACEMENT  <2017   @ [facility]/notes 07/03/2013  . HYSTEROTOMY    . PERIPHERAL VASCULAR CATHETERIZATION N/A 08/30/2015   Procedure: Dialysis/Perma Catheter Insertion;  Surgeon: Annice Needy, MD;  Location: [facility];  Service: Cardiovascular;  Laterality: N/A;  . PERIPHERAL VASCULAR CATHETERIZATION N/A 01/01/2016   Procedure: Dialysis/Perma Catheter Insertion;  Surgeon: Annice Needy, MD;  Location: [facility];  Service: Cardiovascular;  Laterality: N/A;  . PERITONEAL CATHETER INSERTION  11/03/2013   Kimberly Montgomery 11/03/2013  . PERITONEAL CATHETER REMOVAL  01/01/2016   "took the one from May out; put new PD cath in" (01/19/2016)  . PERITONEAL CATHETER REMOVAL  11/03/2013; 02/11/2014   Kimberly Montgomery 11/03/2013; Removal of tunneled catheter/notes 02/11/2014  . SALPINGOOPHORECTOMY Right    Kimberly Montgomery 07/03/2013  . TUBAL LIGATION     HPI:  Pt is a 51 year old female with PMH of seizure (on keppra), ESRD-HD, DM, CHF, GERD, HLD, and CAD who presented to ED with seizure and AMS. Patient was intubated for inability to protect her airway and PCCM was called to admit. Intubated 05/31/16-06/03/16. CXR showed mildly increased left basilar density suggests subsegmental atelectasis. A trace left pleural effusion may be present. There is no pulmonary edema. Thoracic aortic atherosclerosis. MRI of brain showed no  acute intracranial abnormality. Remote lacunar infarcts within the posterior circulation. Mild generalized atrophy and white matter disease is advanced for age.   Assessment / Plan / Recommendation Clinical Impression  [name] was alert, however she appeared confused. Vocal quality was mildly hoarse, however this was functional given recent intubation. Noted generalized weakness during oral motor assessment  with weak volitional cough. Skilled observation of pt with thin liquids via straw resulted in immediate coughing (thin liquids via cup did not result in any s/s of aspiration). Mastication of regular solids was timely with no signs of oral or pharyngeal phase impairments. Due to pt's confusion and clinical s/s of aspiration, recommend Dys 3 solids, nectar thick liquids (no straws), meds whole in puree. ST will f/u for possible diet upgrade and to ensure safety/efficiency of swallow. Instrumental swallow eval (MBS or FEES) may be warranted if s/s of aspiration persist at bedside. SLP Visit Diagnosis: Dysphagia, unspecified (R13.10)    Aspiration Risk  Moderate aspiration risk    Diet Recommendation Nectar-thick liquid;Dysphagia 3 (Mech soft)   Liquid Administration via: Cup;No straw Medication Administration: Whole meds with puree Supervision: Full supervision/cueing for compensatory strategies;Staff to assist with self feeding Compensations: Slow rate;Small sips/bites Postural Changes: Seated upright at 90 degrees    Other  Recommendations Oral Care Recommendations: Oral care BID Other Recommendations: Order thickener from pharmacy   Follow up Recommendations Skilled Nursing facility      Frequency and Duration min 2x/week  2 weeks       Prognosis Prognosis for Safe Diet Advancement: Good      Swallow Study   General HPI: Pt is a 51 year old female with PMH of seizure (on keppra), ESRD-HD, DM, CHF, GERD, HLD, and CAD who presented to ED with seizure and AMS. Patient was intubated for inability to protect her airway and PCCM was called to admit. Intubated 05/31/16-06/03/16. CXR showed mildly increased left basilar density suggests subsegmental atelectasis. A trace left pleural effusion may be present. There is no pulmonary edema. Thoracic aortic atherosclerosis. MRI of brain showed no acute intracranial abnormality. Remote lacunar infarcts within the posterior circulation. Mild generalized  atrophy and white matter disease is advanced for age. Type of Study: Bedside Swallow Evaluation Previous Swallow Assessment: none noted Diet Prior to this Study: NPO Temperature Spikes Noted: No Respiratory Status: Nasal cannula History of Recent Intubation: Yes Length of Intubations (days): 4 days Date extubated: 06/03/16 Behavior/Cognition: Alert;Cooperative;Confused;Requires cueing Oral Cavity Assessment: Within Functional Limits Oral Care Completed by SLP: No Oral Cavity - Dentition: Missing dentition;Poor condition Vision: Functional for self-feeding Self-Feeding Abilities: Total assist Patient Positioning: Upright in bed Baseline Vocal Quality: Hoarse;Low vocal intensity Volitional Cough: Weak Volitional Swallow: Able to elicit    Oral/Motor/Sensory Function Overall Oral Motor/Sensory Function: Generalized oral weakness (temorous) Facial ROM: Reduced right;Reduced left Facial Symmetry: Within Functional Limits Facial Strength: Reduced right;Reduced left Lingual ROM: Within Functional Limits Lingual Symmetry: Within Functional Limits Lingual Strength: Reduced Velum: Within Functional Limits   Ice Chips Ice chips: Not tested   Thin Liquid Thin Liquid: Impaired Presentation: Cup;Straw Oral Phase Impairments:  (none) Oral Phase Functional Implications:  (none) Pharyngeal  Phase Impairments: Cough - Immediate    Nectar Thick Nectar Thick Liquid: Not tested   Honey Thick Honey Thick Liquid: Not tested   Puree Puree: Not tested   Solid   GO   Solid: Within functional limits Presentation: Self Fed        [employer] , Student-SLP 06/04/2016,9:55 AM

## 2016-06-04 NOTE — Progress Notes (Addendum)
PCCM Progress Note  Admission date: 05/31/2016 Referring provider: Dr. Julious Oka, ER  CC: Altered mental status  HPI: 51 yo presented with altered mental status and seizure with HTN emergency (SBP 240).  Intubated for airway protection.  Had fever.  She has hx of seizures, ESRD on HD, DM, CHF.  Subjective: Doesn't remember what happened.  Vital signs: BP (!) 180/69   Pulse 83   Temp 98.6 F (37 C) (Oral)   Resp 15   Ht _0  (1.753 m)   Wt 198 lb (89.8 kg)   LMP 03/16/2015   SpO2 99%   BMI 29.24 kg/m   Intake/output: I/O last 3 completed shifts: In: 905.1 [I.V.:335.1; Other:90; NG/GT:350; IV Piggyback:130] Out: 3310 [Urine:310; Other:3000]  General: alert Neuro: mild tremor HEENT: no stridor Cardiac: regular, no murmur Chest: no wheeze Abd: soft, non tender Ext: no edema, Rt BKA Skin: faint macular rash on hands   CMP Latest Ref Rng & Units 06/04/2016 06/03/2016 06/02/2016  Glucose 65 - 99 mg/dL 123(H) 118(H) 116(H)  BUN 6 - 20 mg/dL 22(H) 39(H) 28(H)  Creatinine 0.44 - 1.00 mg/dL 3.82(H) 5.29(H) 4.24(H)  Sodium 135 - 145 mmol/L 136 137 136  Potassium 3.5 - 5.1 mmol/L 3.5 4.2 3.5  Chloride 101 - 111 mmol/L 98(L) 97(L) 100(L)  CO2 22 - 32 mmol/L _1 Calcium 8.9 - 10.3 mg/dL 9.8 10.3 10.2  Total Protein 6.5 - 8.1 g/dL - - -  Total Bilirubin 0.3 - 1.2 mg/dL - - -  Alkaline Phos 38 - 126 U/L - - -  AST 15 - 41 U/L - - -  ALT 14 - 54 U/L - - -     CBC Latest Ref Rng & Units 06/04/2016 06/03/2016 06/02/2016  WBC 4.0 - 10.5 K/uL 9.3 7.9 8.9  Hemoglobin 12.0 - 15.0 g/dL 11.2(L) 11.1(L) 11.8(L)  Hematocrit 36.0 - 46.0 % 35.6(L) 34.5(L) 36.9  Platelets 150 - 400 K/uL 255 306 273     ABG    Component Value Date/Time   PHART 7.532 (H) 06/01/2016 0403   PCO2ART 33.6 06/01/2016 0403   PO2ART 171.0 (H) 06/01/2016 0403   HCO3 28.2 (H) 06/01/2016 0403   TCO2 29 06/01/2016 0403   ACIDBASEDEF 6.4 (H) 07/21/2015 2143   O2SAT 100.0 06/01/2016 0403     CBG (last 3)    Recent Labs  06/03/16 2359 06/04/16 0353 06/04/16 0817  GLUCAP 118* 114* 135*     Imaging: Dg Chest Port 1 View  Result Date: 06/03/2016 CLINICAL DATA:  Acute respiratory failure, shortness of breath, history of coronary artery disease and CHF, previous MI, diabetes, former smoker. EXAM: PORTABLE CHEST 1 VIEW COMPARISON:  Portable chest x-ray of June 01, 2016 FINDINGS: The lungs are adequately inflated. The retrocardiac density on the left is mildly increase today. There is partial obscuration of the hemidiaphragm. The right lung is clear. The heart is normal in size. The pulmonary vascularity is not engorged. There is calcification in the wall of the aortic arch. The dual-lumen dialysis catheter tip projects over the cavoatrial junction. The endotracheal tube tip lies 6.1 cm above the carina. The esophagogastric tube tip projects below the inferior margin of the image. IMPRESSION: Mildly increased left basilar density suggests subsegmental atelectasis. A trace left pleural effusion may be present. There is no pulmonary edema. The support tubes are in stable position. Thoracic aortic atherosclerosis. Electronically Signed   By: David  Martinique M.D.   On: 06/03/2016 07:10   Dg Abd Portable  1 View  Result Date: 06/02/2016 CLINICAL DATA:  Encounter for OG tube placement. EXAM: PORTABLE ABDOMEN - 1 VIEW COMPARISON:  None. FINDINGS: OG tube appropriately positioned in the stomach with tip directed towards the stomach pylorus/duodenal bulb. Visualized bowel gas pattern is nonobstructive. IMPRESSION: OG tube appears appropriately positioned in the stomach. Electronically Signed   By: Franki Cabot M.D.   On: 06/02/2016 13:17     Studies: CT head 3/02 >> negative MRI brain 3/02 >> remote lacunar infarcts EEG 3/02 >> generalized slowing with triphasic waves LP 3/02 >> glucose 69, RBC 555, protein 43, WBC 6 Echo 3/05 >> EF 55 to 60%, significant MAC on mitral valve can't exclude  vegetation  Antibiotics: Meropenem 3/02 >> 3/02 Vancomycin 3/02 >>  Cultures: Blood 3/02 >> Coag neg Staph Sputum 3/02 >> oral flora CSF 3/02 >> negative Blood 3/04 >>   Lines/tubes: ETT 3/02 >> 3/05 Lt [facility] HD cath 3/04 >>  Events: 3/02 Admit 3/04 off cleviprix 3/06 To telemetry  Summary:  Assessment/plan:  Seizures. Tremor. - improved - continue keppra per neurology  Hx of CAD, HLD, HTN. - continue metoprolol, amlodipine, lisinopril, plavix, imdur, hydralazine  ESRD. - HD per nephrology  Coag negative Staph bacteremia with fever. - day 5 of vancomycin - ?vegetation on TTE from 3/05 >> will arrange for TEE  DM. - SSI  Hx of gastroparesis. - hold outpt reglan for now  Hx of bipolar, depression, anxiety. - hold outpt neurontin, risperdal for now - d/c wellbutrin in setting of seizures  Chronic headaches. - resume percocet  Deconditioning. - PT/OT assessment  Resolved problems >> acute encephalopathy, acute hypoxic respiratory failure, HTN emergency  DVT prophylaxis - SQ heparin SUP - Protonix Nutrition - D3 diet Goals of care - Full code  To telemetry 3/06 >> [facility] 3/07 and PCCM off.  Chesley Mires, MD [facility] Pulmonary/Critical Care 06/04/2016, 10:17 AM Pager:  [phone] After 3pm call: [phone]

## 2016-06-05 ENCOUNTER — Inpatient Hospital Stay (HOSPITAL_COMMUNITY): Payer: Medicare HMO

## 2016-06-05 DIAGNOSIS — N186 End stage renal disease: Secondary | ICD-10-CM

## 2016-06-05 DIAGNOSIS — Z992 Dependence on renal dialysis: Secondary | ICD-10-CM

## 2016-06-05 DIAGNOSIS — I679 Cerebrovascular disease, unspecified: Secondary | ICD-10-CM

## 2016-06-05 LAB — CULTURE, BLOOD (ROUTINE X 2): Culture: NO GROWTH

## 2016-06-05 LAB — ANAEROBIC CULTURE

## 2016-06-05 LAB — GLUCOSE, CAPILLARY
GLUCOSE-CAPILLARY: 148 mg/dL — AB (ref 65–99)
Glucose-Capillary: 134 mg/dL — ABNORMAL HIGH (ref 65–99)
Glucose-Capillary: 164 mg/dL — ABNORMAL HIGH (ref 65–99)
Glucose-Capillary: 156 mg/dL — ABNORMAL HIGH (ref 65–99)

## 2016-06-05 MED ORDER — HYDRALAZINE HCL 20 MG/ML IJ SOLN
10.0000 mg | INTRAMUSCULAR | Status: DC | PRN
Start: 2016-06-05 — End: 2016-06-11
  Administered 2016-06-06 – 2016-06-08 (×4): 10 mg via INTRAVENOUS
  Filled 2016-06-05 (×3): qty 1

## 2016-06-05 MED ORDER — POLYETHYLENE GLYCOL 3350 17 G PO PACK
17.0000 g | PACK | Freq: Every day | ORAL | Status: DC
  Administered 2016-06-05 – 2016-06-10 (×4): 17 g via ORAL
  Filled 2016-06-05 (×5): qty 1

## 2016-06-05 MED ORDER — LEVETIRACETAM 100 MG/ML PO SOLN
1000.0000 mg | Freq: Every day | ORAL | Status: DC
  Administered 2016-06-06 – 2016-06-11 (×4): 1000 mg via ORAL
  Filled 2016-06-05 (×6): qty 10

## 2016-06-05 MED ORDER — LACTULOSE 10 GM/15ML PO SOLN
10.0000 g | Freq: Every day | ORAL | Status: DC
Start: 2016-06-05 — End: 2016-06-11
  Administered 2016-06-05 – 2016-06-11 (×6): 10 g via ORAL
  Filled 2016-06-05 (×6): qty 15

## 2016-06-05 MED ORDER — ALUM & MAG HYDROXIDE-SIMETH 200-200-20 MG/5ML PO SUSP
30.0000 mL | Freq: Four times a day (QID) | ORAL | Status: DC | PRN
  Administered 2016-06-05 – 2016-06-09 (×2): 30 mL via ORAL
  Filled 2016-06-05 (×3): qty 30

## 2016-06-05 MED ORDER — PANTOPRAZOLE SODIUM 40 MG PO TBEC
40.0000 mg | DELAYED_RELEASE_TABLET | Freq: Two times a day (BID) | ORAL | Status: DC
  Administered 2016-06-05 – 2016-06-11 (×11): 40 mg via ORAL
  Filled 2016-06-05 (×11): qty 1

## 2016-06-05 MED ORDER — OXYCODONE HCL 5 MG PO TABS
5.0000 mg | ORAL_TABLET | ORAL | Status: DC | PRN
  Administered 2016-06-05 – 2016-06-07 (×9): 5 mg via ORAL
  Filled 2016-06-05 (×8): qty 1

## 2016-06-05 MED ORDER — METOPROLOL TARTRATE 50 MG PO TABS
100.0000 mg | ORAL_TABLET | Freq: Two times a day (BID) | ORAL | Status: DC
  Administered 2016-06-05 – 2016-06-11 (×11): 100 mg via ORAL
  Filled 2016-06-05: qty 1
  Filled 2016-06-05 (×2): qty 2
  Filled 2016-06-05: qty 1
  Filled 2016-06-05: qty 2
  Filled 2016-06-05: qty 1
  Filled 2016-06-05: qty 2
  Filled 2016-06-05: qty 1
  Filled 2016-06-05 (×4): qty 2

## 2016-06-05 MED ORDER — FENTANYL CITRATE (PF) 100 MCG/2ML IJ SOLN
25.0000 ug | INTRAMUSCULAR | Status: DC | PRN
  Administered 2016-06-08 – 2016-06-11 (×8): 25 ug via INTRAVENOUS
  Filled 2016-06-05 (×7): qty 2

## 2016-06-05 NOTE — Evaluation (Addendum)
Occupational Therapy Evaluation Patient Details Name: Kimberly Montgomery MRN: [id number] DOB: May 11, 1965 Today's Date: 06/05/2016    History of Present Illness 51 year old female with PMH of seizure (on keppra), ESRD-HD, DM and CHF who presents to the ED with seizure and AMS.PMH significant for CAD, Bipolar disorder, anxiety, chronic low back pain, depression, HLD, HTN. Pt found unresponsive in SNF and brought by EMS to [facility]. Pt believed to have had seizure in SNF. Pt intubated 3/2-3/6.    Clinical Impression   Pt required total assist with ADL PTA, was able to self feed and sit EOB unassisted. Currently pt requires total assist for ADL and max assist for sitting balance during EOB grooming and stretching activities. Pt presenting with increased tone in bil UEs (L>R), poor sitting balance, decreased activity tolerance, and pain impacting her independence and safety with ADL and functional mobility. Recommending SNF for follow up to maximize independence and safety with ADL and functional mobility. Pt would benefit from continued skilled OT to address established goals.    Follow Up Recommendations  SNF;Supervision/Assistance - 24 hour    Equipment Recommendations  None recommended by OT    Recommendations for Other Services       Precautions / Restrictions Precautions Precautions: Fall Precaution Comments: R BKA Restrictions Weight Bearing Restrictions: No      Mobility Bed Mobility Overal bed mobility: Needs Assistance Bed Mobility: Supine to Sit;Sit to Supine     Supine to sit: +2 for physical assistance;HOB elevated;Max assist Sit to supine: +2 for physical assistance;Total assist   General bed mobility comments: verbal cues for LE to EOB with assist need to offload weight of LE, maxA for trunk upright, use of pad to square hips in line with bed, HOB elevated  Transfers                 General transfer comment: pt does not want to attempt secondary to pain/fatigue     Balance Overall balance assessment: Needs assistance Sitting-balance support: Feet supported;Bilateral upper extremity supported Sitting balance-Leahy Scale: Poor Sitting balance - Comments: initially requires maxA to maintain upright posture but progressed to minA during session and returned to maxA at end of session with fatigue Postural control: Posterior lean;Right lateral lean                                  ADL Overall ADL's : Needs assistance/impaired Eating/Feeding: Moderate assistance;Bed level   Grooming: Maximal assistance;Sitting;Wash/dry face;Brushing hair   Upper Body Bathing: Total assistance;Bed level   Lower Body Bathing: Total assistance;Bed level   Upper Body Dressing : Total assistance;Bed level   Lower Body Dressing: Total assistance;Bed level                 General ADL Comments: Educated pts daughter on UE ROM and encouraging self feeding and grooming at bed level.     Vision         Perception     Praxis      Pertinent Vitals/Pain Pain Assessment: 0-10 Pain Score: 4  Faces Pain Scale: Hurts little more Pain Location: L elbow with movement, everywhere Pain Descriptors / Indicators: Discomfort;Sore;Grimacing Pain Intervention(s): Limited activity within patient's tolerance;Monitored during session     Hand Dominance Right   Extremity/Trunk Assessment Upper Extremity Assessment Upper Extremity Assessment: RUE deficits/detail;LUE deficits/detail RUE Deficits / Details: AROM overall WFL, slightly limited shoulder flexion. Increased tone noted throughout L>R. Grossly  3+/5. LUE Deficits / Details: Limited shoulder and elbow flexion; painful at elbow. With stretching; pt able to increase ROM. Grossly 3/5. Increased tone noted throughout.   Lower Extremity Assessment Lower Extremity Assessment: Defer to PT evaluation RLE Deficits / Details: unable to perform strength testing at knee and hip without physical assist to offload  weight of LE, tightness noted in hamstrings LLE Deficits / Details: unable to perform strength testing at ankle, knee and hip without physical assist to offload weight of LE, tightness noted in hamstrings/calves   Cervical / Trunk Assessment Cervical / Trunk Assessment: Kyphotic   Communication Communication Communication: No difficulties   Cognition Arousal/Alertness: Awake/alert Behavior During Therapy: Flat affect Overall Cognitive Status: History of cognitive impairments - at baseline                 General Comments: hx of bipolar, anxierty and depression; pt with some anxiety about falling when initially seated EOB   General Comments  R BKA    Exercises       Shoulder Instructions      Home Living Family/patient expects to be discharged to:: Skilled nursing facility                                 Additional Comments: pt was being hoyer lifted to w/c at [facility], pt doesn't propel self in chair dependent on staff, was feeding self, dependent for hygiene and tolieting      Prior Functioning/Environment Level of Independence: Needs assistance  Gait / Transfers Assistance Needed: non-ambulatory, w/c dependent, doesn't propel self ADL's / Homemaking Assistance Needed: Assistance with ADLs            OT Problem List: Decreased strength;Decreased range of motion;Decreased activity tolerance;Impaired balance (sitting and/or standing);Decreased coordination;Decreased cognition;Decreased safety awareness;Decreased knowledge of use of DME or AE;Decreased knowledge of precautions;Obesity;Impaired UE functional use;Increased edema;Pain;Impaired tone      OT Treatment/Interventions: Self-care/ADL training;Therapeutic exercise;Energy conservation;DME and/or AE instruction;Therapeutic activities;Patient/family education;Balance training    OT Goals(Current goals can be found in the care plan section) Acute Rehab OT Goals Patient Stated Goal: to get  better OT Goal Formulation: With patient/family Time For Goal Achievement: 06/19/16 Potential to Achieve Goals: Good ADL Goals Pt Will Perform Eating: sitting;with set-up Pt Will Perform Grooming: with min assist;sitting (3 tasks) Additional ADL Goal #1: Pt will sit EOB x10 minutes with min guard assist as precursor to ADL. Additional ADL Goal #2: Pt/caregiver will independently perfrom AAROM bil UEs.  OT Frequency: Min 2X/week   Barriers to D/C:            Co-evaluation PT/OT/SLP Co-Evaluation/Treatment: Yes Reason for Co-Treatment: Complexity of the patient's impairments (multi-system involvement);For patient/therapist safety PT goals addressed during session: Mobility/safety with mobility;Balance;Strengthening/ROM OT goals addressed during session: ADL's and self-care      End of Session Equipment Utilized During Treatment: Oxygen  Activity Tolerance: Patient limited by pain Patient left: in bed;with call bell/phone within reach;with family/visitor present  OT Visit Diagnosis: Other abnormalities of gait and mobility (R26.89);Muscle weakness (generalized) (M62.81)                ADL either performed or assessed with clinical judgement  Time: 1110-1153 OT Time Calculation (min): 43 min Charges:  OT General Charges $OT Visit: 1 Procedure OT Evaluation $OT Eval Moderate Complexity: 1 Procedure G-Codes:     Tejasvi Brissett A. Brett Albino, M.S., OTR/L Pager: [phone]  Gaye Alken 06/05/2016,  1:46 PM

## 2016-06-05 NOTE — Progress Notes (Signed)
[facility] TEAM 1 - Stepdown/ICU TEAM  Kimberly Montgomery  ZOX:[id number] DOB: 29-Mar-1966 DOA: 05/31/2016 PCP: Jarome Matin, MD    Brief Narrative:  51 yo F who presented with altered mental status and seizure with HTN emergency (SBP 240).  Intubated for airway protection.  Had fever.  She has hx of seizures, ESRD on HD, DM, CHF.  Significant Events: 3/2 admit - LP (unrevealing) 3/5 TTE EF 55-60% - ?mitral vegetation  3/7 TRH assumed care   Subjective: The pt is alert but sluggish to respond to questions.  She does not appear to be in acute resp distress or uncontrolled pain.  She tells me her "backside" hurts from being in bed.    Assessment & Plan:  Seizures LP unremarkable - CT/MRI head negative - Neuro following and directing medical tx  ESRD HD per Nephrology - has L chest HD catheter  Anemia of CKD Hgb stable   Dysphagia  On modified diet per SLP - advance as able   Coag negative Meth resistant Staph bacteremia - 1 of 2 blood cx +  cont vanc - ?vegetation on TTE from 3/05 - to have TEE (will check w/ PCCM to see if this has been arranged)  CAD w/ hx MI 2017 On chronic ASA + Plavix  HTN BP variable - follow trend w/ ongoing HD - may require further med adjustment   DM CBG reasonably well controlled - follow w/o change in tx plan today   Gastroparesis Consider resuming usual reglan dosing once more alert   Bipolar D/O Off Wellbutrin for now given seizure activity   Chronic headaches On chronic percocet  Deconditioning PT/OT assessment  Resolved problems: acute encephalopathy acute hypoxic respiratory failure HTN emergency   DVT prophylaxis: SQ heparin  Code Status: FULL CODE Family Communication: no family present at time of exam  Disposition Plan: stable for transfer to neuro tele bed   Consultants:  Nephrology PCCM Neurology   Antimicrobials:  Meropenem 3/02  Vancomycin 3/02 >   Objective: Blood pressure (!) 168/57, pulse 70,  temperature 98.7 F (37.1 C), temperature source Oral, resp. rate 16, height 5\' 9"  (1.753 m), weight 89.5 kg (197 lb 5 oz), last menstrual period 03/16/2015, SpO2 100 %.  Intake/Output Summary (Last 24 hours) at 06/05/16 0825 Last data filed at 06/05/16 0600  Gross per 24 hour  Intake              795 ml  Output              846 ml  Net              -51 ml   Filed Weights   06/03/16 1845 06/04/16 0411 06/05/16 0500  Weight: 93.1 kg (205 lb 4 oz) 89.8 kg (198 lb) 89.5 kg (197 lb 5 oz)    Examination: General: No acute respiratory distress - alert but somewhat lethargic  Lungs: Clear to auscultation bilaterally without wheezes or crackles Cardiovascular: Regular rate and rhythm without murmur gallop or rub normal S1 and S2 Abdomen: Nontender, nondistended, soft, bowel sounds positive, no rebound, no ascites, no appreciable mass Extremities: No significant cyanosis, clubbing, or edema L LE   CBC:  Recent Labs Lab 05/31/16 1030 05/31/16 1102 06/01/16 0226 06/02/16 0241 06/03/16 0228 06/04/16 0311  WBC 6.4  --  10.2 8.9 7.9 9.3  NEUTROABS 4.2  --   --   --   --   --   HGB 12.0 12.6 11.1* 11.8* 11.1*  11.2*  HCT 37.6 37.0 33.6* 36.9 34.5* 35.6*  MCV 88.7  --  86.4 88.9 87.8 90.4  PLT 195  --  236 273 306 255   Basic Metabolic Panel:  Recent Labs Lab 05/31/16 1030 05/31/16 1102 06/01/16 0226 06/02/16 0241 06/03/16 0228 06/04/16 0311  NA 132* 132* 133* 136 137 136  K 4.1 3.9 3.6 3.5 4.2 3.5  CL 96* 96* 94* 100* 97* 98*  CO2 24  --  25 26 25 26   GLUCOSE 124* 127* 100* 116* 118* 123*  BUN 33* 36* 39* 28* 39* 22*  CREATININE 4.01* 4.00* 4.40* 4.24* 5.29* 3.82*  CALCIUM 9.7  --  10.1 10.2 10.3 9.8  MG  --   --  2.1  --   --   --   PHOS  --   --  4.2  --   --  4.3   GFR: Estimated Creatinine Clearance: 20.5 mL/min (by C-G formula based on SCr of 3.82 mg/dL (H)).  Liver Function Tests:  Recent Labs Lab 05/31/16 1030 06/04/16 0311  AST 15  --   ALT 11*  --     ALKPHOS 66  --   BILITOT 0.5  --   PROT 6.2*  --   ALBUMIN 3.1* 3.5    Recent Labs Lab 06/01/16 0910  AMMONIA <9*    Coagulation Profile:  Recent Labs Lab 05/31/16 1030  INR 0.99    Cardiac Enzymes:  Recent Labs Lab 06/01/16 1552  CKTOTAL 24*    HbA1C: Hemoglobin A1C  Date/Time Value Ref Range Status  07/17/2014 04:17 AM 13.8 (H) % Final    Comment:    4.0-6.0 NOTE: New Reference Range  06/07/14   07/20/2013 07:05 AM 9.1 (H) 4.2 - 6.3 % Final    Comment:    The American Diabetes Association recommends that a primary goal of therapy should be <7% and that physicians should reevaluate the treatment regimen in patients with HbA1c values consistently >8%.    Hgb A1c MFr Bld  Date/Time Value Ref Range Status  09/13/2015 02:27 AM 6.2 (H) 4.0 - 6.0 % Final  07/18/2015 04:55 AM 8.6 (H) 4.0 - 6.0 % Final    CBG:  Recent Labs Lab 06/04/16 0353 06/04/16 0817 06/04/16 1202 06/04/16 1605 06/04/16 2001  GLUCAP 114* 135* 204* 141* 154*    Recent Results (from the past 240 hour(s))  Anaerobic culture     Status: None (Preliminary result)   Collection Time: 05/31/16  1:27 PM  Result Value Ref Range Status   Specimen Description CSF  Final   Special Requests NONE  Final   Culture   Final    NO ANAEROBES ISOLATED; CULTURE IN PROGRESS FOR 5 DAYS   Report Status PENDING  Incomplete  CSF culture     Status: None   Collection Time: 05/31/16  1:27 PM  Result Value Ref Range Status   Specimen Description CSF  Final   Special Requests NONE  Final   Gram Stain   Final    WBC PRESENT, PREDOMINANTLY MONONUCLEAR NO ORGANISMS SEEN CYTOSPIN SMEAR    Culture NO GROWTH 3 DAYS  Final   Report Status 06/03/2016 FINAL  Final  Culture, blood (Routine X 2) w Reflex to ID Panel     Status: None (Preliminary result)   Collection Time: 05/31/16  5:00 PM  Result Value Ref Range Status   Specimen Description BLOOD RIGHT WRIST  Final   Special Requests IN PEDIATRIC BOTTLE  [facility]  Final  Culture NO GROWTH 4 DAYS  Final   Report Status PENDING  Incomplete  Culture, blood (Routine X 2) w Reflex to ID Panel     Status: Abnormal   Collection Time: 05/31/16  5:03 PM  Result Value Ref Range Status   Specimen Description BLOOD RIGHT HAND  Final   Special Requests IN PEDIATRIC BOTTLE 2CC  Final   Culture  Setup Time   Final    GRAM POSITIVE COCCI IN CLUSTERS AEROBIC BOTTLE ONLY CRITICAL RESULT CALLED TO, READ BACK BY AND VERIFIED WITH: M SHUDA 06/02/16 @ 0840 M VESTAL    Culture (A)  Final    STAPHYLOCOCCUS SPECIES (COAGULASE NEGATIVE) THE SIGNIFICANCE OF ISOLATING THIS ORGANISM FROM A SINGLE SET OF BLOOD CULTURES WHEN MULTIPLE SETS ARE DRAWN IS UNCERTAIN. PLEASE NOTIFY THE MICROBIOLOGY DEPARTMENT WITHIN ONE WEEK IF SPECIATION AND SENSITIVITIES ARE REQUIRED.    Report Status 06/04/2016 FINAL  Final  Blood Culture ID Panel (Reflexed)     Status: Abnormal   Collection Time: 05/31/16  5:03 PM  Result Value Ref Range Status   Enterococcus species NOT DETECTED NOT DETECTED Final   Listeria monocytogenes NOT DETECTED NOT DETECTED Final   Staphylococcus species DETECTED (A) NOT DETECTED Final    Comment: Methicillin (oxacillin) resistant coagulase negative staphylococcus. Possible blood culture contaminant (unless isolated from more than one blood culture draw or clinical case suggests pathogenicity). No antibiotic treatment is indicated for blood  culture contaminants. CRITICAL RESULT CALLED TO, READ BACK BY AND VERIFIED WITH: Melvyn Neth 06/02/16 @ 0840 M VESTAL    Staphylococcus aureus NOT DETECTED NOT DETECTED Final   Methicillin resistance DETECTED (A) NOT DETECTED Final    Comment: CRITICAL RESULT CALLED TO, READ BACK BY AND VERIFIED WITH: M SHUDA 06/02/16 @ 0840 M VESTAL    Streptococcus species NOT DETECTED NOT DETECTED Final   Streptococcus agalactiae NOT DETECTED NOT DETECTED Final   Streptococcus pneumoniae NOT DETECTED NOT DETECTED Final   Streptococcus pyogenes  NOT DETECTED NOT DETECTED Final   Acinetobacter baumannii NOT DETECTED NOT DETECTED Final   Enterobacteriaceae species NOT DETECTED NOT DETECTED Final   Enterobacter cloacae complex NOT DETECTED NOT DETECTED Final   Escherichia coli NOT DETECTED NOT DETECTED Final   Klebsiella oxytoca NOT DETECTED NOT DETECTED Final   Klebsiella pneumoniae NOT DETECTED NOT DETECTED Final   Proteus species NOT DETECTED NOT DETECTED Final   Serratia marcescens NOT DETECTED NOT DETECTED Final   Haemophilus influenzae NOT DETECTED NOT DETECTED Final   Neisseria meningitidis NOT DETECTED NOT DETECTED Final   Pseudomonas aeruginosa NOT DETECTED NOT DETECTED Final   Candida albicans NOT DETECTED NOT DETECTED Final   Candida glabrata NOT DETECTED NOT DETECTED Final   Candida krusei NOT DETECTED NOT DETECTED Final   Candida parapsilosis NOT DETECTED NOT DETECTED Final   Candida tropicalis NOT DETECTED NOT DETECTED Final  MRSA PCR Screening     Status: None   Collection Time: 05/31/16  7:29 PM  Result Value Ref Range Status   MRSA by PCR NEGATIVE NEGATIVE Final    Comment:        The GeneXpert MRSA Assay (FDA approved for NASAL specimens only), is one component of a comprehensive MRSA colonization surveillance program. It is not intended to diagnose MRSA infection nor to guide or monitor treatment for MRSA infections.   Culture, respiratory (NON-Expectorated)     Status: None   Collection Time: 05/31/16 11:12 PM  Result Value Ref Range Status   Specimen Description TRACHEAL ASPIRATE  Final   Special Requests NONE  Final   Gram Stain   Final    RARE WBC PRESENT, PREDOMINANTLY PMN RARE SQUAMOUS EPITHELIAL CELLS PRESENT RARE GRAM POSITIVE COCCOBACILLUS    Culture Consistent with normal respiratory flora.  Final   Report Status 06/03/2016 FINAL  Final  Culture, blood (Routine X 2) w Reflex to ID Panel     Status: None (Preliminary result)   Collection Time: 06/03/16  9:45 AM  Result Value Ref Range  Status   Specimen Description BLOOD RIGHT HAND  Final   Special Requests IN PEDIATRIC BOTTLE 2CC  Final   Culture NO GROWTH 1 DAY  Final   Report Status PENDING  Incomplete  Culture, blood (Routine X 2) w Reflex to ID Panel     Status: None (Preliminary result)   Collection Time: 06/03/16 10:20 AM  Result Value Ref Range Status   Specimen Description BLOOD LEFT HAND  Final   Special Requests BOTTLES DRAWN AEROBIC AND ANAEROBIC 5CC  Final   Culture NO GROWTH 1 DAY  Final   Report Status PENDING  Incomplete     Scheduled Meds: . amLODipine  10 mg Oral Daily  . Chlorhexidine Gluconate Cloth  6 each Topical q morning - 10a  . clopidogrel  75 mg Oral Daily  . heparin  5,000 Units Subcutaneous Q8H  . hydrALAZINE  25 mg Oral Daily  . insulin aspart  0-5 Units Subcutaneous QHS  . insulin aspart  0-9 Units Subcutaneous TID WC  . isosorbide mononitrate  90 mg Oral Daily  . levETIRAcetam  1,000 mg Intravenous Daily  . levETIRAcetam  500 mg Oral Q T,Th,Sat-1800  . lisinopril  10 mg Oral Daily  . metoprolol  50 mg Oral BID  . pantoprazole  40 mg Oral QHS  . [START ON 06/06/2016] vancomycin  1,000 mg Intravenous Q T,Th,Sa-HD    LOS: 5 days   Lonia Blood, MD [facility] Office  [phone] Pager - Text Page per Amion as per below:  On-Call/Text Page:      [email or web address]      password [email or web address]  If 7PM-7AM, please contact night-coverage [email or web address] Password [email or web address] 06/05/2016, 8:25 AM

## 2016-06-05 NOTE — Evaluation (Signed)
Physical Therapy Evaluation Patient Details Name: Kimberly Montgomery MRN: [id number] DOB: Aug 04, 1965 Today's Date: 06/05/2016   History of Present Illness  51 year old female with PMH of seizure (on keppra), ESRD-HD, DM and CHF who presents to the ED with seizure and AMS.PMH significant for CAD, Bipolar disorder, anxiety, chronic low back pain, depression, HLD, HTN. Pt found unresponsive in SNF and brought by EMS to [facility]. Pt believed to have had seizure in SNF. Pt intubated 3/2-3/6.   Clinical Impression  Pt presents with decreased strength, ROM, balance and activity tolerance secondary to above and recurrent hospitalizations. Pt admit from SNF where she required assist for all mobility. Recommend d/c to SNF with 24/7 assist when medically ready. Acute PT will follow.     Follow Up Recommendations SNF;Supervision/Assistance - 24 hour    Equipment Recommendations  None recommended by PT    Recommendations for Other Services       Precautions / Restrictions Precautions Precautions: Fall Precaution Comments: R BKA Restrictions Weight Bearing Restrictions: Yes      Mobility  Bed Mobility Overal bed mobility: Needs Assistance Bed Mobility: Supine to Sit;Sit to Supine     Supine to sit: +2 for physical assistance;HOB elevated;Max assist Sit to supine: Max assist;+2 for physical assistance;HOB elevated   General bed mobility comments: verbal cues for LE to EOB with assist need to offload weight of LE, maxA for trunk upright, use of pad to square hips in line with bed, HOB elevated, facilitated trunk rotation and chest extension for tightness  Transfers                 General transfer comment: pt does not want to attempt secondary to pain/fatigue  Ambulation/Gait                Stairs            Wheelchair Mobility    Modified Rankin (Stroke Patients Only)       Balance Overall balance assessment: Needs assistance Sitting-balance support: Feet  supported;Bilateral upper extremity supported Sitting balance-Leahy Scale: Poor Sitting balance - Comments: initially requires maxA to maintain upright posture but progressed to minA during session and returned to maxA at end of session with fatigue Postural control: Posterior lean;Right lateral lean                                   Pertinent Vitals/Pain Pain Assessment: 0-10 Pain Score: 4  Faces Pain Scale: Hurts little more Pain Location: everywhere Pain Descriptors / Indicators: Discomfort;Sore Pain Intervention(s): Limited activity within patient's tolerance;Monitored during session;Repositioned    Home Living Family/patient expects to be discharged to:: Skilled nursing facility                 Additional Comments: pt was being hoyer lifted to w/c at [facility], pt doesn't propel self in chair dependent on staff, was feeding self, dependent for hygiene and tolieting    Prior Function Level of Independence: Needs assistance   Gait / Transfers Assistance Needed: non-ambulatory, w/c dependent, doesn't propel self  ADL's / Homemaking Assistance Needed: Assistance with ADLs        Hand Dominance   Dominant Hand: Right    Extremity/Trunk Assessment   Upper Extremity Assessment Upper Extremity Assessment: Defer to OT evaluation (noted extreme tightness in UE, especiall elbow/shoulder)    Lower Extremity Assessment Lower Extremity Assessment: RLE deficits/detail;LLE deficits/detail RLE Deficits / Details:  unable to perform strength testing at knee and hip without physical assist to offload weight of LE, tightness noted in hamstrings LLE Deficits / Details: unable to perform strength testing at ankle, knee and hip without physical assist to offload weight of LE, tightness noted in hamstrings/calves    Cervical / Trunk Assessment Cervical / Trunk Assessment: Kyphotic  Communication   Communication: No difficulties  Cognition Arousal/Alertness:  Awake/alert Behavior During Therapy: WFL for tasks assessed/performed Overall Cognitive Status: History of cognitive impairments - at baseline                 General Comments: hx of bipolar, anxierty and depression; pt with some anxiety about falling when initially seated EOB    General Comments General comments (skin integrity, edema, etc.): R BKA    Exercises     Assessment/Plan    PT Assessment Patient needs continued PT services  PT Problem List Decreased strength;Decreased range of motion;Decreased activity tolerance;Decreased balance;Decreased mobility;Decreased knowledge of use of DME;Pain       PT Treatment Interventions DME instruction;Functional mobility training;Therapeutic activities;Therapeutic exercise;Balance training;Patient/family education;Wheelchair mobility training;Manual techniques    PT Goals (Current goals can be found in the Care Plan section)  Acute Rehab PT Goals Patient Stated Goal: to get better PT Goal Formulation: With patient Time For Goal Achievement: 06/19/16 Potential to Achieve Goals: Fair    Frequency Min 3X/week   Barriers to discharge Decreased caregiver support level of A currently required    Co-evaluation PT/OT/SLP Co-Evaluation/Treatment: Yes Reason for Co-Treatment: Complexity of the patient's impairments (multi-system involvement);For patient/therapist safety;To address functional/ADL transfers PT goals addressed during session: Mobility/safety with mobility;Balance;Strengthening/ROM         End of Session Equipment Utilized During Treatment: Oxygen (2L O2) Activity Tolerance: Patient tolerated treatment well Patient left: in bed;with call bell/phone within reach;with bed alarm set;with nursing/sitter in room Nurse Communication: Mobility status PT Visit Diagnosis: Other abnormalities of gait and mobility (R26.89);Muscle weakness (generalized) (M62.81);Pain Pain - part of body: Arm;Leg (back)         Time:  0354-6568 PT Time Calculation (min) (ACUTE ONLY): 43 min   Charges:   PT Evaluation $PT Eval Moderate Complexity: 1 Procedure PT Treatments $Therapeutic Activity: 8-22 mins   PT G CodesLane Hacker 2016-06-27, 12:46 PM  Lane Hacker, SPT Acute Rehab SPT [phone]

## 2016-06-05 NOTE — Clinical Social Work Placement (Signed)
   CLINICAL SOCIAL WORK PLACEMENT  NOTE  Date:  06/05/2016  Patient Details  Name: Kimberly Montgomery MRN: [id number] Date of Birth: [birth year]  Clinical Social Work is seeking post-discharge placement for this patient at the Skilled  Nursing Facility level of care (*CSW will initial, date and re-position this form in  chart as items are completed):  Yes   Patient/family provided with [facility] Clinical Social Work Department's list of facilities offering this level of care within the geographic area requested by the patient (or if unable, by the patient's family).  Yes   Patient/family informed of their freedom to choose among providers that offer the needed level of care, that participate in Medicare, Medicaid or managed care program needed by the patient, have an available bed and are willing to accept the patient.  Yes   Patient/family informed of [facility]'s ownership interest in [facility] and [facility], as well as of the fact that they are under no obligation to receive care at these facilities.  PASRR submitted to EDS on 06/05/16     PASRR number received on 06/05/16     Existing PASRR number confirmed on 06/05/16     FL2 transmitted to all facilities in geographic area requested by pt/family on 06/05/16     FL2 transmitted to all facilities within larger geographic area on 06/05/16     Patient informed that his/her managed care company has contracts with or will negotiate with certain facilities, including the following:            Patient/family informed of bed offers received.  Patient chooses bed at       Physician recommends and patient chooses bed at      Patient to be transferred to   on  .  Patient to be transferred to facility by       Patient family notified on   of transfer.  Name of family member notified:        PHYSICIAN       Additional Comment:    _______________________________________________ Lew Dawes, LCSW 06/05/2016, 9:33  AM

## 2016-06-05 NOTE — Progress Notes (Signed)
OT Cancellation Note  Patient Details Name: KOLIE RISSMAN MRN: [id number] DOB: [birth year]   Cancelled Treatment:    Reason Eval/Treat Not Completed: Patient at procedure or test/ unavailable. Pt currently off the unit. Will follow up for OT eval as time allows.  Gaye Alken M.S., OTR/L Pager: [phone]  06/05/2016, 9:19 AM

## 2016-06-05 NOTE — Progress Notes (Signed)
PT Cancellation Note  Patient Details Name: Kimberly Montgomery MRN: [id number] DOB: [birth year]   Cancelled Treatment:    Reason Eval/Treat Not Completed: Patient at procedure or test/unavailable. Pt off the floor at [facility]. PT to return as able to complete PT eval.   Nathali Vent M Denson Niccoli 06/05/2016, 9:51 AM   Lewis Shock, PT, DPT Pager #: [phone] Office #: [phone]

## 2016-06-05 NOTE — Progress Notes (Signed)
[facility] Progress Note   Subjective: a little more alert and less confused today  Vitals:   06/05/16 1000 06/05/16 1100 06/05/16 1156 06/05/16 1200  BP: (!) 150/65 (!) 193/89  (!) 134/52  Pulse: 70 81  66  Resp: 19 18  19   Temp:   98.7 F (37.1 C)   TempSrc:   Oral   SpO2: 100% 100%  100%  Weight:      Height:        Inpatient medications: . amLODipine  10 mg Oral Daily  . Chlorhexidine Gluconate Cloth  6 each Topical q morning - 10a  . clopidogrel  75 mg Oral Daily  . heparin  5,000 Units Subcutaneous Q8H  . hydrALAZINE  25 mg Oral Daily  . insulin aspart  0-5 Units Subcutaneous QHS  . insulin aspart  0-9 Units Subcutaneous TID WC  . isosorbide mononitrate  90 mg Oral Daily  . lactulose  10 g Oral Daily  . levETIRAcetam  1,000 mg Intravenous Daily  . levETIRAcetam  500 mg Oral Q T,Th,Sat-1800  . lisinopril  10 mg Oral Daily  . metoprolol  100 mg Oral BID  . pantoprazole  40 mg Oral BID AC  . polyethylene glycol  17 g Oral Daily  . [START ON 06/06/2016] vancomycin  1,000 mg Intravenous Q T,Th,Sa-HD    alum & mag hydroxide-simeth, fentaNYL (SUBLIMAZE) injection, heparin, hydrALAZINE, oxyCODONE  Exam: No distress, chron ill appearing Head/ lip tremors cont to improve Slow speech No jvd Chest clear bilat RRR Abd obese, soft ntnd Ext 1-2 +pitting edema Neuro is awake, responds verbally, "Shields", doesn't know year L IJ [facility]  Dialysis: [facility] on [address]/ TTS 4h  108kg  2K bath  Hep 2000 then 900/hr   Permcath  - EPO 6000 tiw  - Iron 100 biw      Assessment: 1. Seizures - hx of seizures in the past. LP neg.  Head CT/ MRI neg. Per neuro.  2. ESRD HD TTS 3. HTN - on 4 BP meds at home (lisinopri/ metop/ hydral/ norvasc), on same regimen here 4. Volume - didn't tolerate UF w HD yesterday, may be at new dry wt 5. HTN  -  on 4 bp meds here as at home, BP's good 6. Anemia of CKD - Hb good at 11 7. EPS - due to risperdal/ reglan,  both have been dc'd, improved  Plan - cont TTS HD, BP meds, small UF  Vinson Moselle MD [facility] pager [phone]   06/05/2016, 12:16 PM    Recent Labs Lab 06/01/16 0226 06/02/16 0241 06/03/16 0228 06/04/16 0311  NA 133* 136 137 136  K 3.6 3.5 4.2 3.5  CL 94* 100* 97* 98*  CO2 25 26 25 26   GLUCOSE 100* 116* 118* 123*  BUN 39* 28* 39* 22*  CREATININE 4.40* 4.24* 5.29* 3.82*  CALCIUM 10.1 10.2 10.3 9.8  PHOS 4.2  --   --  4.3    Recent Labs Lab 05/31/16 1030 06/04/16 0311  AST 15  --   ALT 11*  --   ALKPHOS 66  --   BILITOT 0.5  --   PROT 6.2*  --   ALBUMIN 3.1* 3.5    Recent Labs Lab 05/31/16 1030  06/02/16 0241 06/03/16 0228 06/04/16 0311  WBC 6.4  < > 8.9 7.9 9.3  NEUTROABS 4.2  --   --   --   --   HGB 12.0  < >  11.8* 11.1* 11.2*  HCT 37.6  < > 36.9 34.5* 35.6*  MCV 88.7  < > 88.9 87.8 90.4  PLT 195  < > 273 306 255  < > = values in this interval not displayed. Iron/TIBC/Ferritin/ %Sat    Component Value Date/Time   IRON 24 (L) 07/12/2013 0509   TIBC 214 (L) 07/12/2013 0509   FERRITIN 183 07/12/2013 0509   IRONPCTSAT 11 07/12/2013 0509

## 2016-06-05 NOTE — Progress Notes (Signed)
Neurology Progress Note  Subjective: This morning, she feels improved and is able to speak. She had no complaints.  Medications reviewed and reconciled.   Pertinent meds: Levetiracetam IV  Current Meds:   Current Facility-Administered Medications:  .  amLODipine (NORVASC) tablet 10 mg, 10 mg, Oral, Daily, Coralyn Helling, MD, 10 mg at 06/05/16 1034 .  Chlorhexidine Gluconate Cloth 2 % PADS 6 each, 6 each, Topical, q morning - 10a, Alyson Reedy, MD, 6 each at 06/05/16 1000 .  clopidogrel (PLAVIX) tablet 75 mg, 75 mg, Oral, Daily, Coralyn Helling, MD, 75 mg at 06/05/16 1034 .  fentaNYL (SUBLIMAZE) injection 25 mcg, 25 mcg, Intravenous, Q2H PRN, Lonia Blood, MD .  heparin injection 1,900 Units, 20 Units/kg, Dialysis, PRN, Annie Sable, MD .  heparin injection 5,000 Units, 5,000 Units, Subcutaneous, Q8H, Rahul P Desai, PA-C, 5,000 Units at 06/05/16 0514 .  hydrALAZINE (APRESOLINE) injection 10 mg, 10 mg, Intravenous, Q4H PRN, Lonia Blood, MD .  hydrALAZINE (APRESOLINE) tablet 25 mg, 25 mg, Oral, Daily, Coralyn Helling, MD, 25 mg at 06/05/16 1033 .  insulin aspart (novoLOG) injection 0-5 Units, 0-5 Units, Subcutaneous, QHS, Vineet Sood, MD .  insulin aspart (novoLOG) injection 0-9 Units, 0-9 Units, Subcutaneous, TID WC, Coralyn Helling, MD, 1 Units at 06/05/16 [phone] .  isosorbide mononitrate (IMDUR) 24 hr tablet 90 mg, 90 mg, Oral, Daily, Coralyn Helling, MD, 90 mg at 06/05/16 1018 .  lactulose (CHRONULAC) 10 GM/15ML solution 10 g, 10 g, Oral, Daily, Lonia Blood, MD, 10 g at 06/05/16 1049 .  levETIRAcetam (KEPPRA) 1,000 mg in sodium chloride 0.9 % 100 mL IVPB, 1,000 mg, Intravenous, Daily, Rejeana Brock, MD, 1,000 mg at 06/05/16 1011 .  levETIRAcetam (KEPPRA) 100 MG/ML solution 500 mg, 500 mg, Oral, Q T,Th,Sat-1800, Coralyn Helling, MD, 500 mg at 06/04/16 1840 .  lisinopril (PRINIVIL,ZESTRIL) tablet 10 mg, 10 mg, Oral, Daily, Coralyn Helling, MD, 10 mg at 06/05/16 1034 .  metoprolol  (LOPRESSOR) tablet 100 mg, 100 mg, Oral, BID, Lonia Blood, MD, 100 mg at 06/05/16 1034 .  oxyCODONE (Oxy IR/ROXICODONE) immediate release tablet 5 mg, 5 mg, Oral, Q4H PRN, Lonia Blood, MD, 5 mg at 06/05/16 1034 .  pantoprazole (PROTONIX) EC tablet 40 mg, 40 mg, Oral, QHS, Coralyn Helling, MD, 40 mg at 06/04/16 2104 .  polyethylene glycol (MIRALAX / GLYCOLAX) packet 17 g, 17 g, Oral, Daily, Lonia Blood, MD, 17 g at 06/05/16 1050 .  [START ON 06/06/2016] vancomycin (VANCOCIN) IVPB 1000 mg/200 mL premix, 1,000 mg, Intravenous, Q T,Th,Sa-HD, Alyson Reedy, MD, 1,000 mg at 06/04/16 1723  Objective:  Temp:  [97.6 F (36.4 C)-99 F (37.2 C)] 98.7 F (37.1 C) (03/07 0802) Pulse Rate:  [64-103] 70 (03/07 1000) Resp:  [8-27] 19 (03/07 1000) BP: (102-190)/(37-94) 150/65 (03/07 1000) SpO2:  [98 %-100 %] 100 % (03/07 1000) Weight:  [197 lb 5 oz (89.5 kg)] 197 lb 5 oz (89.5 kg) (03/07 0500)  General:  Middle aged female in no acute distress. Alert, oriented x4. Speech is clear without dysarthria. Affect is bright.  HEENT: Neck is supple without lymphadenopathy. Mucous membranes are moist and the oropharynx is clear. Sclerae are anicteric. There is no conjunctival injection.  CV: Regular, no murmur.  Lungs: CTAB in the anterior lung fields.  Extremities: s/p R BKA Neuro: MS: As noted above.  CN: Pupils are equal and reactive from 3-->2 mm bilaterally. EOMI, no nystagmus. Facial sensation is intact to light touch. Face is  symmetric at rest with normal strength and mobility. Hearing is intact to conversational voice. Voice is normal in tone and quality. Unable to visualize uvula and palate. Bilateral SCM and trapezii are 4/5. Tongue is midline with normal bulk and mobility.  Motor: Normal bulk, tone, throughout. 4/5 strength in the upper extremities; 2/5 of the LLE. No tremor or other abnormal movements are observed.  Sensation: Intact to light touch bilaterally. DTRs: Symmetrically  hyporeflexic.  Coordination: Finger-to-nose worse on the right than left, possibly from wires. Gait: Deferred.  Labs: Lab Results  Component Value Date   WBC 9.3 06/04/2016   HGB 11.2 (L) 06/04/2016   HCT 35.6 (L) 06/04/2016   PLT 255 06/04/2016   GLUCOSE 123 (H) 06/04/2016   CHOL 215 (H) 11/19/2015   TRIG 186 (H) 11/19/2015   HDL 63 11/19/2015   LDLCALC 115 (H) 11/19/2015   ALT 11 (L) 05/31/2016   AST 15 05/31/2016   NA 136 06/04/2016   K 3.5 06/04/2016   CL 98 (L) 06/04/2016   CREATININE 3.82 (H) 06/04/2016   BUN 22 (H) 06/04/2016   CO2 26 06/04/2016   TSH 2.417 06/01/2016   INR 0.99 05/31/2016   HGBA1C 6.2 (H) 09/13/2015   CBC Latest Ref Rng & Units 06/04/2016 06/03/2016 06/02/2016  WBC 4.0 - 10.5 K/uL 9.3 7.9 8.9  Hemoglobin 12.0 - 15.0 g/dL 11.2(L) 11.1(L) 11.8(L)  Hematocrit 36.0 - 46.0 % 35.6(L) 34.5(L) 36.9  Platelets 150 - 400 K/uL 255 306 273    Lab Results  Component Value Date   HGBA1C 6.2 (H) 09/13/2015   Lab Results  Component Value Date   ALT 11 (L) 05/31/2016   AST 15 05/31/2016   ALKPHOS 66 05/31/2016   BILITOT 0.5 05/31/2016    A/P:  Kimberly Montgomery is a 51 year old woman with ESRD on HD, seizure disorder hospitalized for acute encephalopathy with overall improvement.   Seizure disorder: Continue levetiracetam IV.   Tremor: Resolved.  Headache disorder: Give acetaminophen as needed.  The patient would likely benefit from acute rehab services. Recommend consultation of PT/OT/SLP with consideration for PM&R consult as appropriate depending upon clinical progress and therapists' recommendations.   Fall risk: Risk factors for falls include age, need for assistance with ADLs at baseline, use of psychotropic/sedative/hypnotic medications, polypharmacy, female gender, impaired mobility/gait, visual impairments, and orthopedic issues. Limit psychoactive medications and sedating medications.  Delirium risk: Risk factors for delirium include history of  dementia/cognitive impairment, age, co-morbid illness, severity of medical illness, infection, medications/polypharmacy, impaired ADLs at baseline, immobility, sensory impairment (decreased vision, decreased hearing), metabolic derangement, ICU admission, length of hospital stay. Continue to optimize metabolic status as you are. Continue to treat any underlying infection. Minimize the use of opiates, benzos or any medication with strong anticholinergic properties as much as possible. Optimize sleep-wake cycles as much as you can by keeping the room bright with activity during the day and dark and quiet at night.   Needs outpatient neurology follow-up?: Yes  This was discussed with the patient. Education was provided on the diagnosis and expected evaluation and treatment. They are in agreement with the plan as noted. They were given the opportunity to ask any questions and these were addressed to their satisfaction.   We have no new neurologic recommendations at this time and will sign off.   Heywood Iles, PGY3 Internal Medicine Pager: [phone]

## 2016-06-05 NOTE — Progress Notes (Signed)
  Speech Language Pathology Treatment: Dysphagia  Patient Details Name: Kimberly Montgomery MRN: [id number] DOB: 11-22-1965 Today's Date: 06/05/2016 Time: 0812-0826 SLP Time Calculation (min) (ACUTE ONLY): 14 min  Assessment / Plan / Recommendation Clinical Impression  [name] appeared less confused this morning during dysphagia treatment. Pt required feeding assistance and set up from SLP due to hand tremor. Observed pt with nectar thick and thin liquids via cup with no coughing/throat clearing at bedside, although noted audible swallows. Pt did not experience any oral or pharyngeal difficulties with intake of Dys 3 solids. Due to hx of recent intubation, an instrumental swallow evaluation would be beneficial to assess the pt's swallowing mechanism. MBS is scheduled for today; diet recommendations will be made after results.   HPI HPI: Pt is a 51 year old female with PMH of seizure (on keppra), ESRD-HD, DM, CHF, GERD, HLD, and CAD who presented to ED with seizure and AMS. Patient was intubated for inability to protect her airway and PCCM was called to admit. Intubated 05/31/16-06/03/16. CXR showed mildly increased left basilar density suggests subsegmental atelectasis. A trace left pleural effusion may be present. There is no pulmonary edema. Thoracic aortic atherosclerosis. MRI of brain showed no acute intracranial abnormality. Remote lacunar infarcts within the posterior circulation. Mild generalized atrophy and white matter disease is advanced for age. Bedside swallow eval 06/04/16-recommended Dys 3, nectar thick (no straws), meds whole in puree.      SLP Plan  MBS       Recommendations  Diet recommendations: Other(comment) (defer until [facility])                Oral Care Recommendations: Oral care BID Follow up Recommendations: Skilled Nursing facility SLP Visit Diagnosis: Dysphagia, unspecified (R13.10) Plan: MBS       GO                Macarthur Critchley , Student-SLP 06/05/2016, 9:00  AM

## 2016-06-05 NOTE — Progress Notes (Signed)
Modified Barium Swallow Progress Note  Patient Details  Name: Kimberly Montgomery MRN: [id number] Date of Birth: 30-Jun-1965  Today's Date: 06/05/2016  Modified Barium Swallow completed.  Full report located under Chart Review in the Imaging Section.  Brief recommendations include the following:  Clinical Impression  Ms. Creppel presents with a very mild pharyngeal dysphagia characterized by 2 instances of flash penetration with thin liquids (1 with straw, 1 with cup). Pt's swallow was timely and laryngeal closure/airway protection was adequate. Noted minimal vallecular residue with thin liquids throughout the study. No oral or pharyngeal phase impairments with solids. Educated pt and RN re: diet recommendations and pt's swallowing function. Due to deconditioned state and hx of recent intubation, recommend Dys 3 solids, thin liquids (no straws), meds whole in puree. ST will f/u for treatment to check diet tolerance and for possible texture upgrade.     Swallow Evaluation Recommendations       SLP Diet Recommendations: Dysphagia 3 (Mech soft) solids;Thin liquid   Liquid Administration via: Cup;No straw   Medication Administration: Whole meds with puree   Supervision: Full assist for feeding;Intermittent supervision to cue for compensatory strategies   Compensations: Slow rate;Small sips/bites   Postural Changes: Seated upright at 90 degrees   Oral Care Recommendations: Oral care BID        Royce Macadamia 06/05/2016,11:26 AM   Breck Coons Lonell Face.Ed [employer] [phone]

## 2016-06-05 NOTE — NC FL2 (Signed)
[place] MEDICAID FL2 LEVEL OF CARE SCREENING TOOL     IDENTIFICATION  Patient Name: Kimberly Montgomery Birthdate: [birth year] Sex: female Admission Date (Current Location): 05/31/2016  [place] and [place] Number:  Haynes Bast [id number] Facility and Address:  [facility], [address] [place], [place] [address]      Provider Number: [id number]  Attending Physician Name and Address:  Lonia Blood, MD  Relative Name and Phone Number:  Santo Held (Daughter) [phone]    Current Level of Care: Hospital Recommended Level of Care: Nursing Facility, Skilled Nursing Facility Prior Approval Number:    Date Approved/Denied:   PASRR Number: [id number]  Discharge Plan: SNF    Current Diagnoses: Patient Active Problem List   Diagnosis Date Noted  . Seizure (HCC)   . Acute encephalopathy   . Tremor   . Cerebrovascular disease   . Complication from renal dialysis device 04/08/2016  . Colitis 03/13/2016  . Chronic diastolic congestive heart failure (HCC) 01/22/2016  . Pressure injury of skin 01/20/2016  . Bacteremia 12/27/2015  . Essential hypertension 11/21/2015  . Anemia of chronic disease 11/21/2015  . Acute respiratory failure with hypoxia (HCC) 11/21/2015  . ESRD on dialysis (HCC) 11/21/2015  . 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 NSTEMI (non-ST elevated myocardial infarction) (HCC) 11/20/2015  . Chest pain, rule out acute myocardial infarction 11/18/2015  . Bipolar I disorder, most recent episode depressed (HCC)   . Altered mental status 08/24/2015  . Ileus (HCC)   . Bipolar I disorder (HCC) 07/25/2015  . Seizures (HCC) 07/25/2015  . Peritonitis (HCC) 07/16/2015  . Unstable angina (HCC) 07/09/2015  . ESRD on peritoneal dialysis (HCC) 07/09/2015  . Accelerated hypertension 07/09/2015  . Type 2 diabetes mellitus (HCC) 07/09/2015  . CAD (coronary artery disease) 07/09/2015  . HLD (hyperlipidemia) 07/09/2015  . GERD (gastroesophageal reflux  disease) 07/09/2015    Orientation RESPIRATION BLADDER Height & Weight     Self, Situation, Place  O2 (2L Nasal Cannula) Continent, Indwelling catheter Weight: 197 lb 5 oz (89.5 kg) Height:  5\' 9"  (175.3 cm)  BEHAVIORAL SYMPTOMS/MOOD NEUROLOGICAL BOWEL NUTRITION STATUS   (NONE) Convulsions/Seizures Incontinent Diet (DYS 3 Nectar Thick)  AMBULATORY STATUS COMMUNICATION OF NEEDS Skin   Extensive Assist Verbally Normal                       Personal Care Assistance Level of Assistance  Bathing, Feeding, Dressing Bathing Assistance: Maximum assistance Feeding assistance: Limited assistance Dressing Assistance: Maximum assistance     Functional Limitations Info  Sight, Hearing, Speech Sight Info: Adequate Hearing Info: Adequate Speech Info: Adequate    SPECIAL CARE FACTORS FREQUENCY  PT (By licensed PT)                    Contractures Contractures Info: Not present    Additional Factors Info  Code Status, Allergies, Insulin Sliding Scale Code Status Info: FULL Allergies Info: Cephalosporins, Penicillins, Lamictal [Lamotrigine], Phenergan [Promethazine Hcl], Pravastatin, Sulfa Antibiotics   Insulin Sliding Scale Info: novoLOG 0-5 units daily at bedtime; novoLOG 0-9 units 3x daily w/ meals       Current Medications (06/05/2016):  This is the current hospital active medication list Current Facility-Administered Medications  Medication Dose Route Frequency Provider Last Rate Last Dose  . amLODipine (NORVASC) tablet 10 mg  10 mg Oral Daily Coralyn Helling, MD   10 mg at 06/04/16 1344  . Chlorhexidine Gluconate Cloth 2 % PADS 6 each  6 each Topical q morning - 10a Alyson Reedy, MD   6 each at 06/04/16 0600  . clopidogrel (PLAVIX) tablet 75 mg  75 mg Oral Daily Coralyn Helling, MD   75 mg at 06/04/16 1212  . fentaNYL (SUBLIMAZE) injection 25 mcg  25 mcg Intravenous Q2H PRN Lonia Blood, MD      . heparin injection 1,900 Units  20 Units/kg Dialysis PRN Annie Sable, MD      . heparin injection 5,000 Units  5,000 Units Subcutaneous Q8H Rahul P Desai, PA-C   5,000 Units at 06/05/16 0514  . hydrALAZINE (APRESOLINE) injection 10 mg  10 mg Intravenous Q4H PRN Lonia Blood, MD      . hydrALAZINE (APRESOLINE) tablet 25 mg  25 mg Oral Daily Coralyn Helling, MD   25 mg at 06/04/16 1222  . insulin aspart (novoLOG) injection 0-5 Units  0-5 Units Subcutaneous QHS Coralyn Helling, MD      . insulin aspart (novoLOG) injection 0-9 Units  0-9 Units Subcutaneous TID WC Coralyn Helling, MD   1 Units at 06/05/16 [phone]  . isosorbide mononitrate (IMDUR) 24 hr tablet 90 mg  90 mg Oral Daily Coralyn Helling, MD   90 mg at 06/04/16 1214  . lactulose (CHRONULAC) 10 GM/15ML solution 10 g  10 g Oral Daily Lonia Blood, MD      . levETIRAcetam (KEPPRA) 1,000 mg in sodium chloride 0.9 % 100 mL IVPB  1,000 mg Intravenous Daily Rejeana Brock, MD   1,000 mg at 06/04/16 1232  . levETIRAcetam (KEPPRA) 100 MG/ML solution 500 mg  500 mg Oral Q T,Th,Sat-1800 Coralyn Helling, MD   500 mg at 06/04/16 1840  . lisinopril (PRINIVIL,ZESTRIL) tablet 10 mg  10 mg Oral Daily Coralyn Helling, MD   10 mg at 06/04/16 1343  . metoprolol (LOPRESSOR) tablet 100 mg  100 mg Oral BID Lonia Blood, MD      . oxyCODONE (Oxy IR/ROXICODONE) immediate release tablet 5 mg  5 mg Oral Q4H PRN Lonia Blood, MD      . pantoprazole (PROTONIX) EC tablet 40 mg  40 mg Oral QHS Coralyn Helling, MD   40 mg at 06/04/16 2104  . polyethylene glycol (MIRALAX / GLYCOLAX) packet 17 g  17 g Oral Daily Lonia Blood, MD      . Melene Muller ON 06/06/2016] vancomycin (VANCOCIN) IVPB 1000 mg/200 mL premix  1,000 mg Intravenous Q T,Th,Sa-HD Alyson Reedy, MD   1,000 mg at 06/04/16 1723     Discharge Medications: Please see discharge summary for a list of discharge medications.  Relevant Imaging Results:  Relevant Lab Results:   Additional Information ss: [id number]; Patient receives outpatient dialysis; Patient is long  term care w/ Medicaid  Lew Dawes, LCSW

## 2016-06-06 LAB — COMPREHENSIVE METABOLIC PANEL
ALT: 21 U/L (ref 14–54)
ANION GAP: 9 (ref 5–15)
AST: 19 U/L (ref 15–41)
Albumin: 3.1 g/dL — ABNORMAL LOW (ref 3.5–5.0)
Alkaline Phosphatase: 53 U/L (ref 38–126)
BILIRUBIN TOTAL: 0.6 mg/dL (ref 0.3–1.2)
BUN: 22 mg/dL — ABNORMAL HIGH (ref 6–20)
CHLORIDE: 101 mmol/L (ref 101–111)
CO2: 26 mmol/L (ref 22–32)
Calcium: 9.6 mg/dL (ref 8.9–10.3)
Creatinine, Ser: 3.86 mg/dL — ABNORMAL HIGH (ref 0.44–1.00)
GFR, EST AFRICAN AMERICAN: 14 mL/min — AB (ref >60)
GFR, EST NON AFRICAN AMERICAN: 12 mL/min — AB (ref >60)
Glucose, Bld: 147 mg/dL — ABNORMAL HIGH (ref 65–99)
POTASSIUM: 3.7 mmol/L (ref 3.5–5.1)
Sodium: 136 mmol/L (ref 135–145)
TOTAL PROTEIN: 5.7 g/dL — AB (ref 6.5–8.1)

## 2016-06-06 LAB — AMMONIA: AMMONIA: 25 umol/L (ref 9–35)

## 2016-06-06 LAB — CBC
HEMATOCRIT: 32 % — AB (ref 36.0–46.0)
HEMOGLOBIN: 9.7 g/dL — AB (ref 12.0–15.0)
MCH: 27.4 pg (ref 26.0–34.0)
MCHC: 30.3 g/dL (ref 30.0–36.0)
MCV: 90.4 fL (ref 78.0–100.0)
Platelets: 266 10*3/uL (ref 150–400)
RBC: 3.54 MIL/uL — AB (ref 3.87–5.11)
RDW: 13.8 % (ref 11.5–15.5)
WBC: 6.2 10*3/uL (ref 4.0–10.5)

## 2016-06-06 LAB — VANCOMYCIN, RANDOM: Vancomycin Rm: 29

## 2016-06-06 LAB — FOLATE: FOLATE: 30.1 ng/mL (ref >5.9)

## 2016-06-06 LAB — GLUCOSE, CAPILLARY
GLUCOSE-CAPILLARY: 137 mg/dL — AB (ref 65–99)
GLUCOSE-CAPILLARY: 204 mg/dL — AB (ref 65–99)
Glucose-Capillary: 116 mg/dL — ABNORMAL HIGH (ref 65–99)
Glucose-Capillary: 187 mg/dL — ABNORMAL HIGH (ref 65–99)

## 2016-06-06 LAB — VITAMIN B12: Vitamin B-12: 635 pg/mL (ref 180–914)

## 2016-06-06 MED ORDER — HEPARIN SODIUM (PORCINE) 1000 UNIT/ML DIALYSIS
1000.0000 [IU] | INTRAMUSCULAR | Status: DC | PRN
  Filled 2016-06-06: qty 1

## 2016-06-06 MED ORDER — LIDOCAINE HCL (PF) 1 % IJ SOLN: 5.0000 mL | INTRAMUSCULAR | Status: DC | PRN

## 2016-06-06 MED ORDER — HEPARIN SODIUM (PORCINE) 1000 UNIT/ML DIALYSIS
4000.0000 [IU] | Freq: Once | INTRAMUSCULAR | Status: DC
  Filled 2016-06-06: qty 4

## 2016-06-06 MED ORDER — SODIUM CHLORIDE 0.9 % IV SOLN: 100.0000 mL | INTRAVENOUS | Status: DC | PRN

## 2016-06-06 MED ORDER — PENTAFLUOROPROP-TETRAFLUOROETH EX AERO: 1.0000 "application " | INHALATION_SPRAY | CUTANEOUS | Status: DC | PRN

## 2016-06-06 MED ORDER — VANCOMYCIN HCL IN DEXTROSE 1-5 GM/200ML-% IV SOLN: 1000.0000 mg | INTRAVENOUS | Status: DC

## 2016-06-06 MED ORDER — ALTEPLASE 2 MG IJ SOLR
2.0000 mg | Freq: Once | INTRAMUSCULAR | Status: DC | PRN
  Filled 2016-06-06: qty 2

## 2016-06-06 MED ORDER — LIDOCAINE-PRILOCAINE 2.5-2.5 % EX CREA
1.0000 "application " | TOPICAL_CREAM | CUTANEOUS | Status: DC | PRN
  Filled 2016-06-06: qty 5

## 2016-06-06 MED ORDER — DARBEPOETIN ALFA 60 MCG/0.3ML IJ SOSY
60.0000 ug | PREFILLED_SYRINGE | INTRAMUSCULAR | Status: DC
  Administered 2016-06-06: 60 ug via INTRAVENOUS
  Filled 2016-06-06: qty 0.3

## 2016-06-06 MED ORDER — DARBEPOETIN ALFA 60 MCG/0.3ML IJ SOSY
PREFILLED_SYRINGE | INTRAMUSCULAR | Status: AC
  Filled 2016-06-06: qty 0.3

## 2016-06-06 MED ORDER — LORATADINE 10 MG PO TABS
10.0000 mg | ORAL_TABLET | Freq: Every day | ORAL | Status: DC | PRN
  Administered 2016-06-06: 10 mg via ORAL
  Filled 2016-06-06 (×2): qty 1

## 2016-06-06 MED ORDER — OLOPATADINE HCL 0.1 % OP SOLN
1.0000 [drp] | Freq: Two times a day (BID) | OPHTHALMIC | Status: DC | PRN
  Administered 2016-06-06 – 2016-06-11 (×2): 1 [drp] via OPHTHALMIC
  Filled 2016-06-06 (×2): qty 5

## 2016-06-06 MED ORDER — OXYCODONE HCL 5 MG PO TABS
ORAL_TABLET | ORAL | Status: AC
  Filled 2016-06-06: qty 1

## 2016-06-06 MED ORDER — CLOTRIMAZOLE 2 % VA CREA
1.0000 | TOPICAL_CREAM | Freq: Every day | VAGINAL | Status: AC
  Filled 2016-06-06 (×2): qty 21

## 2016-06-06 MED ORDER — NEPRO/CARBSTEADY PO LIQD
237.0000 mL | Freq: Two times a day (BID) | ORAL | Status: DC
  Administered 2016-06-06 – 2016-06-10 (×3): 237 mL via ORAL
  Filled 2016-06-06 (×13): qty 237

## 2016-06-06 MED ORDER — HYDRALAZINE HCL 20 MG/ML IJ SOLN
INTRAMUSCULAR | Status: AC
  Administered 2016-06-06: 10 mg via INTRAVENOUS
  Filled 2016-06-06: qty 1

## 2016-06-06 NOTE — Progress Notes (Signed)
Patient has bed offer at [facility]. [name] to tour facility today. Facility notified. CSW continues to follow to facilitate discharge to SNF when medically stable and appropriate for discharge.    Enos Fling, MSW, LCSW [facility] ED/28M Clinical Social Worker [phone]

## 2016-06-06 NOTE — Progress Notes (Signed)
[facility] Progress Note   Subjective: no c/o', on HD  Vitals:   06/06/16 0830 06/06/16 0900 06/06/16 0930 06/06/16 1000  BP: (!) 121/56 (!) 102/47 (!) 94/48 (!) 112/55  Pulse: (!) 112 (!) 49 (!) 49 (!) 53  Resp: 19 16 16 16   Temp:      TempSrc:      SpO2: 100% 100% 100% 100%  Weight:      Height:        Inpatient medications: . amLODipine  10 mg Oral Daily  . Chlorhexidine Gluconate Cloth  6 each Topical q morning - 10a  . clopidogrel  75 mg Oral Daily  . clotrimazole  1 Applicatorful Vaginal QHS  . Darbepoetin Alfa      . darbepoetin (ARANESP) injection - DIALYSIS  60 mcg Intravenous Q Thu-HD  . [START ON 06/07/2016] heparin  4,000 Units Dialysis Once in dialysis  . heparin  5,000 Units Subcutaneous Q8H  . hydrALAZINE  25 mg Oral Daily  . insulin aspart  0-5 Units Subcutaneous QHS  . insulin aspart  0-9 Units Subcutaneous TID WC  . isosorbide mononitrate  90 mg Oral Daily  . lactulose  10 g Oral Daily  . levETIRAcetam  1,000 mg Oral Daily  . levETIRAcetam  500 mg Oral Q T,Th,Sat-1800  . lisinopril  10 mg Oral Daily  . metoprolol  100 mg Oral BID  . oxyCODONE      . pantoprazole  40 mg Oral BID AC  . polyethylene glycol  17 g Oral Daily    sodium chloride, sodium chloride, alteplase, alum & mag hydroxide-simeth, fentaNYL (SUBLIMAZE) injection, heparin, hydrALAZINE, lidocaine (PF), lidocaine-prilocaine, oxyCODONE, pentafluoroprop-tetrafluoroeth  Exam: No distress, chron ill appearing Head/ lip tremors cont to improve Slow speech No jvd Chest clear bilat RRR Abd obese, soft ntnd Ext 1-2 +pitting edema Neuro is awake, responds verbally, "[place]", doesn't know year L IJ TDC  Dialysis: [facility] on [address]/ TTS 4h  108kg  2K bath  Hep 2000 then 900/hr   Permcath  - EPO 6000 tiw  - Iron 100 biw      Assessment: 1. Seizures - hx of seizures in the past. LP neg.  Head CT/ MRI neg. Per neuro, is on Keppra, as prior to admission.   2. ESRD HD TTS 3. HTN - on 4 BP meds at home and here, BP's good 4. Volume - 18kg under prior dry wt, still some edema but BP's dropping on HD. This is new dry wt 5. Anemia of CKD - Hb good at 11 6. EPS - due to risperdal/ reglan, both have been dc'd, improved  Plan - HD today  Vinson Moselle MD [facility] pager [phone]   06/06/2016, 11:31 AM    Recent Labs Lab 06/01/16 0226  06/03/16 0228 06/04/16 0311 06/06/16 0231  NA 133*  < > 137 136 136  K 3.6  < > 4.2 3.5 3.7  CL 94*  < > 97* 98* 101  CO2 25  < > 25 26 26   GLUCOSE 100*  < > 118* 123* 147*  BUN 39*  < > 39* 22* 22*  CREATININE 4.40*  < > 5.29* 3.82* 3.86*  CALCIUM 10.1  < > 10.3 9.8 9.6  PHOS 4.2  --   --  4.3  --   < > = values in this interval not displayed.  Recent Labs Lab 05/31/16 1030 06/04/16 0311 06/06/16 0231  AST 15  --  19  ALT 11*  --  21  ALKPHOS 66  --  53  BILITOT 0.5  --  0.6  PROT 6.2*  --  5.7*  ALBUMIN 3.1* 3.5 3.1*    Recent Labs Lab 05/31/16 1030  06/03/16 0228 06/04/16 0311 06/06/16 0231  WBC 6.4  < > 7.9 9.3 6.2  NEUTROABS 4.2  --   --   --   --   HGB 12.0  < > 11.1* 11.2* 9.7*  HCT 37.6  < > 34.5* 35.6* 32.0*  MCV 88.7  < > 87.8 90.4 90.4  PLT 195  < > 306 255 266  < > = values in this interval not displayed. Iron/TIBC/Ferritin/ %Sat    Component Value Date/Time   IRON 24 (L) 07/12/2013 0509   TIBC 214 (L) 07/12/2013 0509   FERRITIN 183 07/12/2013 0509   IRONPCTSAT 11 07/12/2013 0509

## 2016-06-06 NOTE — Progress Notes (Signed)
Pharmacy Antibiotic Note  Kimberly Montgomery is a 51 y.o. female admitted on 05/31/2016 with coagulase negative staphylococcus  bacteremia.  Pharmacy has been consulted for Vancomycin dosing. Patient has ESRD on HD-TTS, received extra session on Monday 3/5. Blood culture grew MRSE in one bottle, repeat blood culture no growth to date. 3/5 ECHO cannot r/o MV vegetation. WBC wnl, afebrile.  preHD level is elevated at 29 (likely due to extra loading dose).   Plan: No vancomycin post HD today. Will resume vancomycin 1g post HD on Saturday and continue with normal Tues-Thurs-Sat post HD.  Monitor HD schedule and tolerance.  Monitor clinical status and culture results.   Height: 5\' 9"  (175.3 cm) Weight: 197 lb 1.5 oz (89.4 kg) IBW/kg (Calculated) : 66.2  Temp (24hrs), Avg:98.4 F (36.9 C), Min:98 F (36.7 C), Max:98.6 F (37 C)   Recent Labs Lab 06/01/16 0226 06/02/16 0241 06/03/16 0228 06/04/16 0311 06/06/16 0231 06/06/16 1200  WBC 10.2 8.9 7.9 9.3 6.2  --   CREATININE 4.40* 4.24* 5.29* 3.82* 3.86*  --   VANCORANDOM  --   --   --   --   --  29    Estimated Creatinine Clearance: 20.3 mL/min (by C-G formula based on SCr of 3.86 mg/dL (H)).    Allergies  Allergen Reactions  . Cephalosporins Anaphylaxis    Patient has tolerated meropenem.   [name] Penicillins Anaphylaxis and Other (See Comments)    Has patient had a PCN reaction causing immediate rash, facial/tongue/throat swelling, SOB or lightheadedness with hypotension: Yes Has patient had a PCN reaction causing severe rash involving mucus membranes or skin necrosis: No Has patient had a PCN reaction that required hospitalization No Has patient had a PCN reaction occurring within the last 10 years: No If all of the above answers are "NO", then may proceed with Cephalosporin use.  . Lamictal [Lamotrigine] Other (See Comments)    Reaction:  Hallucinations  . Phenergan [Promethazine Hcl] Nausea And Vomiting  . Pravastatin Other (See  Comments)    Reaction:  Muscle pain   . Sulfa Antibiotics Other (See Comments)    Reaction:  Unknown     Antimicrobials this admission: Meropenem 3/2 x 1 Vancomycin 3/2 >>  Dose adjustments this admission: 3/8 - Pre-HD level 29 - held dose x1 then resuming 3/10  Microbiology results: 3/2 MRSA PCR negative 3/2 CSF negative 3/2 resp: normal flora 3/2 blood x 2 1/2 CONS 3/5 blood x 2 >>ngtd  Thank you for allowing pharmacy to be a part of this patient's care.  Link Snuffer, PharmD, BCPS Clinical Pharmacist Clinical Phone 06/06/2016 until 3:30 PM- [phone] After hours, please call #[phone] 06/06/2016 1:32 PM

## 2016-06-06 NOTE — Care Management Note (Signed)
Case Management Note  Patient Details  Name: Kimberly Montgomery MRN: [id number] Date of Birth: [birth year]  Subjective/Objective:   Pt admitted with AMS and seizures                Action/Plan:   PTA from SNF - discharge plan will be back to SNF   Expected Discharge Date:                  Expected Discharge Plan:  Skilled Nursing Facility (From long term SNF)  In-House Referral:  Clinical Social Work  Discharge planning Services  CM Consult  Post Acute Care Choice:    Choice offered to:     DME Arranged:    DME Agency:     HH Arranged:    HH Agency:     Status of Service:  In process, will continue to follow  If discussed at Long Length of Stay Meetings, dates discussed:    Additional Comments: Discussed in LOS 06-06-16 - pt remains appropriate for continued stay.  Pt is actively being worked up for endocarditis and per attending is not stable for discharge back to SNF.  CSW and CM will continue to follow  Cherylann Parr, RN 06/06/2016, 2:13 PM

## 2016-06-06 NOTE — Progress Notes (Signed)
Nutrition Follow-up  DOCUMENTATION CODES:   None at this time  INTERVENTION:    Nepro Shake PO BID, each supplement provides 425 kcal and 19 grams protein  NUTRITION DIAGNOSIS:   Inadequate oral intake related to acute illness as evidenced by  (50-75% meal completion).  Ongoing  GOAL:   Patient will meet greater than or equal to 90% of their needs  Progressing  MONITOR:   PO intake, Supplement acceptance, Labs, I & O's  ASSESSMENT:   51 year old female with PMH of seizure (on keppra), ESRD-HD, DM and CHF who presents to the ED with seizure and AMS.  Patient was brought to the ED where she had another seizure, given ativan and seizure activity was under control.  Patient recently had a recent positive blood culture (coag negative staff) that was felt to be a contaminant but with stiff neck and fever concern was for meningitis and LP was performed that was reportedly clean.  Patient was intubated for inability to protect her airway and PCCM was called to admit.  Neuro seeing patient.  Reportedly prior to patient loosing her mental status again and SBP became 240.  Patient was then intubated.  Patient is currently in HD, unable to speak with her. Per discussion with nurse tech, patient requires assistance with feeding. She has a good appetite and is consuming 50-75% of meals. This will not meet her estimated nutrition needs; RD to order Nepro supplement. Labs and medications reviewed.  Diet Order:  DIET DYS 3 Room service appropriate? Yes; Fluid consistency: Thin  Skin:  Reviewed, no issues  Last BM:  3/7  Height:   Ht Readings from Last 1 Encounters:  05/31/16 5\' 9"  (1.753 m)    Weight:   Wt Readings from Last 1 Encounters:  06/06/16 200 lb 3.2 oz (90.8 kg)   90.8 kg is the new dry weight per Nephrology notes.  Ideal Body Weight:  65.91 kg  BMI:  Body mass index is 29.56 kg/m.  Estimated Nutritional Needs:   Kcal:  2000-2200  Protein:  90-100 gm  Fluid:   1.2 L  EDUCATION NEEDS:   No education needs identified at this time  Joaquin Courts, RD, LDN, CNSC Pager [phone] After Hours Pager [phone]

## 2016-06-06 NOTE — Progress Notes (Signed)
[facility] TEAM 1 - Stepdown/ICU TEAM  ADRIENNE TROMBETTA  ZOX:[id number] DOB: 01-01-1966 DOA: 05/31/2016 PCP: Jarome Matin, MD    Brief Narrative:  51 yo F who presented with altered mental status and seizure with HTN emergency (SBP 240).  Intubated for airway protection.  Had fever.  She has hx of seizures, ESRD on HD, DM, CHF.   Subjective:  The pt is awake, mildly sluggish but able to answer questions and follow commands, she is undergoing dialysis, denies any headache, no chest or abdominal pain, no focal weakness.    Assessment & Plan:  Seizures  - LP unremarkable - CT/MRI head negative - Neuro following and directing medical tx, she is currently on Keppra and stable, her tremors were thought to be due to valproic acid side effect which has been discontinued. Continue to monitor.  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 vegetation on TTE from 3/05 under PCCM, hospitalist assumed care on 06/05/2016 cardiology ordered for TEE which will be arranged for 06/06/2016.  CAD w/ hx MI 2017 - On chronic ASA + Plavix  HTN -  BP variable - follow trend w/ ongoing HD - may require further med adjustment   Gastroparesis - Consider resuming usual reglan dosing once more alert   Bipolar D/O - Off Wellbutrin for now given seizure activity   Chronic headaches - On chronic percocet  Deconditioning - PT/OT assessment  DM 2 - CBG reasonably well controlled - follow w/o change in tx plan today   CBG (last 3)   Recent Labs  06/05/16 1528 06/05/16 2154 06/06/16 0744  GLUCAP 164* 156* 137*     Resolved problems:  Acute encephalopathy, Acute hypoxic respiratory failure - both due to seizures and resolved. Required intubation briefly.    DVT prophylaxis: SQ heparin  Code Status: FULL CODE Family  Communication: no family present at time of exam  Disposition Plan: stable for transfer to neuro tele bed   Significant Events: 3/2 admit - LP (unrevealing) 3/5 TTE EF 55-60% - ?mitral vegetation  3/7 TRH assumed care   Consultants:  Nephrology PCCM Neurology   Anti-infectives    Start     Dose/Rate Route Frequency Ordered Stop   06/06/16 1200  vancomycin (VANCOCIN) IVPB 1000 mg/200 mL premix  Status:  Discontinued     1,000 mg 200 mL/hr over 60 Minutes Intravenous Every T-Th-Sa (Hemodialysis) 06/04/16 1640 06/05/16 1456   06/04/16 1200  vancomycin (VANCOCIN) IVPB 1000 mg/200 mL premix  Status:  Discontinued     1,000 mg 200 mL/hr over 60 Minutes Intravenous Every T-Th-Sa (Hemodialysis) 06/02/16 1054 06/04/16 1527   06/03/16 1200  vancomycin (VANCOCIN) IVPB 1000 mg/200 mL premix     1,000 mg 200 mL/hr over 60 Minutes Intravenous Every M-W-F (Hemodialysis) 06/02/16 1054 06/03/16 1831   06/02/16 1200  vancomycin (VANCOCIN) 2,000 mg in sodium chloride 0.9 % 500 mL IVPB     2,000 mg 250 mL/hr over 120 Minutes Intravenous  Once 06/02/16 1054 06/02/16 1313   05/31/16 1200  meropenem (MERREM) 1 g in sodium chloride 0.9 % 100 mL IVPB     1 g 200 mL/hr over 30 Minutes Intravenous  Once 05/31/16 1151 05/31/16 1405   05/31/16 1145  vancomycin (VANCOCIN) 2,500 mg in sodium chloride 0.9 % 500 mL IVPB     2,500 mg 250 mL/hr over 120 Minutes Intravenous  Once 05/31/16 1142 05/31/16 1434       Objective:  Blood pressure (!) 112/55, pulse (!) 53, temperature 98.5 F (36.9 C), temperature source Oral, resp. rate 16, height 5\' 9"  (1.753 m), weight 90.8 kg (200 lb 3.2 oz), last menstrual period 03/16/2015, SpO2 100 %.  Intake/Output Summary (Last 24 hours) at 06/06/16 1125 Last data filed at 06/06/16 0800  Gross per 24 hour  Intake              140 ml  Output              125 ml  Net               15 ml   Filed Weights   06/04/16 0411 06/05/16 0500 06/06/16 0434  Weight: 89.8 kg (198  lb) 89.5 kg (197 lb 5 oz) 90.8 kg (200 lb 3.2 oz)    Examination: General: No acute respiratory distress - alert but somewhat lethargic  Lungs: Clear to auscultation bilaterally without wheezes or crackles Cardiovascular: Regular rate and rhythm without murmur gallop or rub normal S1 and S2 Abdomen: Nontender, nondistended, soft, bowel sounds positive, no rebound, no ascites, no appreciable mass Extremities: No significant cyanosis, clubbing, or edema L LE   CBC:  Recent Labs Lab 05/31/16 1030  06/01/16 0226 06/02/16 0241 06/03/16 0228 06/04/16 0311 06/06/16 0231  WBC 6.4  --  10.2 8.9 7.9 9.3 6.2  NEUTROABS 4.2  --   --   --   --   --   --   HGB 12.0  < > 11.1* 11.8* 11.1* 11.2* 9.7*  HCT 37.6  < > 33.6* 36.9 34.5* 35.6* 32.0*  MCV 88.7  --  86.4 88.9 87.8 90.4 90.4  PLT 195  --  236 273 306 255 266  < > = values in this interval not displayed. Basic Metabolic Panel:  Recent Labs Lab 06/01/16 0226 06/02/16 0241 06/03/16 0228 06/04/16 0311 06/06/16 0231  NA 133* 136 137 136 136  K 3.6 3.5 4.2 3.5 3.7  CL 94* 100* 97* 98* 101  CO2 25 26 25 26 26   GLUCOSE 100* 116* 118* 123* 147*  BUN 39* 28* 39* 22* 22*  CREATININE 4.40* 4.24* 5.29* 3.82* 3.86*  CALCIUM 10.1 10.2 10.3 9.8 9.6  MG 2.1  --   --   --   --   PHOS 4.2  --   --  4.3  --    GFR: Estimated Creatinine Clearance: 20.5 mL/min (by C-G formula based on SCr of 3.86 mg/dL (H)).  Liver Function Tests:  Recent Labs Lab 05/31/16 1030 06/04/16 0311 06/06/16 0231  AST 15  --  19  ALT 11*  --  21  ALKPHOS 66  --  53  BILITOT 0.5  --  0.6  PROT 6.2*  --  5.7*  ALBUMIN 3.1* 3.5 3.1*    Recent Labs Lab 06/01/16 0910 06/06/16 0231  AMMONIA <9* 25    Coagulation Profile:  Recent Labs Lab 05/31/16 1030  INR 0.99    Cardiac Enzymes:  Recent Labs Lab 06/01/16 1552  CKTOTAL 24*    HbA1C: Hemoglobin A1C  Date/Time Value Ref Range Status  07/17/2014 04:17 AM 13.8 (H) % Final    Comment:     4.0-6.0 NOTE: New Reference Range  06/07/14   07/20/2013 07:05 AM  9.1 (H) 4.2 - 6.3 % Final    Comment:    The American Diabetes Association recommends that a primary goal of therapy should be <7% and that physicians should reevaluate the treatment regimen in patients with HbA1c values consistently >8%.    Hgb A1c MFr Bld  Date/Time Value Ref Range Status  09/13/2015 02:27 AM 6.2 (H) 4.0 - 6.0 % Final  07/18/2015 04:55 AM 8.6 (H) 4.0 - 6.0 % Final    CBG:  Recent Labs Lab 06/05/16 0759 06/05/16 1154 06/05/16 1528 06/05/16 2154 06/06/16 0744  GLUCAP 134* 148* 164* 156* 137*    Recent Results (from the past 240 hour(s))  Anaerobic culture     Status: None   Collection Time: 05/31/16  1:27 PM  Result Value Ref Range Status   Specimen Description CSF  Final   Special Requests NONE  Final   Culture NO ANAEROBES ISOLATED  Final   Report Status 06/05/2016 FINAL  Final  CSF culture     Status: None   Collection Time: 05/31/16  1:27 PM  Result Value Ref Range Status   Specimen Description CSF  Final   Special Requests NONE  Final   Gram Stain   Final    WBC PRESENT, PREDOMINANTLY MONONUCLEAR NO ORGANISMS SEEN CYTOSPIN SMEAR    Culture NO GROWTH 3 DAYS  Final   Report Status 06/03/2016 FINAL  Final  Culture, blood (Routine X 2) w Reflex to ID Panel     Status: None   Collection Time: 05/31/16  5:00 PM  Result Value Ref Range Status   Specimen Description BLOOD RIGHT WRIST  Final   Special Requests IN PEDIATRIC BOTTLE 2CC  Final   Culture NO GROWTH 5 DAYS  Final   Report Status 06/05/2016 FINAL  Final  Culture, blood (Routine X 2) w Reflex to ID Panel     Status: Abnormal   Collection Time: 05/31/16  5:03 PM  Result Value Ref Range Status   Specimen Description BLOOD RIGHT HAND  Final   Special Requests IN PEDIATRIC BOTTLE 2CC  Final   Culture  Setup Time   Final    GRAM POSITIVE COCCI IN CLUSTERS AEROBIC BOTTLE ONLY CRITICAL RESULT CALLED TO, READ BACK BY AND  VERIFIED WITH: M SHUDA 06/02/16 @ 0840 M VESTAL    Culture (A)  Final    STAPHYLOCOCCUS SPECIES (COAGULASE NEGATIVE) THE SIGNIFICANCE OF ISOLATING THIS ORGANISM FROM A SINGLE SET OF BLOOD CULTURES WHEN MULTIPLE SETS ARE DRAWN IS UNCERTAIN. PLEASE NOTIFY THE MICROBIOLOGY DEPARTMENT WITHIN ONE WEEK IF SPECIATION AND SENSITIVITIES ARE REQUIRED.    Report Status 06/04/2016 FINAL  Final  Blood Culture ID Panel (Reflexed)     Status: Abnormal   Collection Time: 05/31/16  5:03 PM  Result Value Ref Range Status   Enterococcus species NOT DETECTED NOT DETECTED Final   Listeria monocytogenes NOT DETECTED NOT DETECTED Final   Staphylococcus species DETECTED (A) NOT DETECTED Final    Comment: Methicillin (oxacillin) resistant coagulase negative staphylococcus. Possible blood culture contaminant (unless isolated from more than one blood culture draw or clinical case suggests pathogenicity). No antibiotic treatment is indicated for blood  culture contaminants. CRITICAL RESULT CALLED TO, READ BACK BY AND VERIFIED WITH: M SHUDA 06/02/16 @ 0840 M VESTAL    Staphylococcus aureus NOT DETECTED NOT DETECTED Final   Methicillin resistance DETECTED (A) NOT DETECTED Final    Comment: CRITICAL RESULT CALLED TO, READ BACK BY AND VERIFIED WITH: M SHUDA 06/02/16 @ 0840 M VESTAL  Streptococcus species NOT DETECTED NOT DETECTED Final   Streptococcus agalactiae NOT DETECTED NOT DETECTED Final   Streptococcus pneumoniae NOT DETECTED NOT DETECTED Final   Streptococcus pyogenes NOT DETECTED NOT DETECTED Final   Acinetobacter baumannii NOT DETECTED NOT DETECTED Final   Enterobacteriaceae species NOT DETECTED NOT DETECTED Final   Enterobacter cloacae complex NOT DETECTED NOT DETECTED Final   Escherichia coli NOT DETECTED NOT DETECTED Final   Klebsiella oxytoca NOT DETECTED NOT DETECTED Final   Klebsiella pneumoniae NOT DETECTED NOT DETECTED Final   Proteus species NOT DETECTED NOT DETECTED Final   Serratia marcescens NOT  DETECTED NOT DETECTED Final   Haemophilus influenzae NOT DETECTED NOT DETECTED Final   Neisseria meningitidis NOT DETECTED NOT DETECTED Final   Pseudomonas aeruginosa NOT DETECTED NOT DETECTED Final   Candida albicans NOT DETECTED NOT DETECTED Final   Candida glabrata NOT DETECTED NOT DETECTED Final   Candida krusei NOT DETECTED NOT DETECTED Final   Candida parapsilosis NOT DETECTED NOT DETECTED Final   Candida tropicalis NOT DETECTED NOT DETECTED Final  MRSA PCR Screening     Status: None   Collection Time: 05/31/16  7:29 PM  Result Value Ref Range Status   MRSA by PCR NEGATIVE NEGATIVE Final    Comment:        The GeneXpert MRSA Assay (FDA approved for NASAL specimens only), is one component of a comprehensive MRSA colonization surveillance program. It is not intended to diagnose MRSA infection nor to guide or monitor treatment for MRSA infections.   Culture, respiratory (NON-Expectorated)     Status: None   Collection Time: 05/31/16 11:12 PM  Result Value Ref Range Status   Specimen Description TRACHEAL ASPIRATE  Final   Special Requests NONE  Final   Gram Stain   Final    RARE WBC PRESENT, PREDOMINANTLY PMN RARE SQUAMOUS EPITHELIAL CELLS PRESENT RARE GRAM POSITIVE COCCOBACILLUS    Culture Consistent with normal respiratory flora.  Final   Report Status 06/03/2016 FINAL  Final  Culture, blood (Routine X 2) w Reflex to ID Panel     Status: None (Preliminary result)   Collection Time: 06/03/16  9:45 AM  Result Value Ref Range Status   Specimen Description BLOOD RIGHT HAND  Final   Special Requests IN PEDIATRIC BOTTLE 2CC  Final   Culture NO GROWTH 2 DAYS  Final   Report Status PENDING  Incomplete  Culture, blood (Routine X 2) w Reflex to ID Panel     Status: None (Preliminary result)   Collection Time: 06/03/16 10:20 AM  Result Value Ref Range Status   Specimen Description BLOOD LEFT HAND  Final   Special Requests BOTTLES DRAWN AEROBIC AND ANAEROBIC 5CC  Final    Culture NO GROWTH 2 DAYS  Final   Report Status PENDING  Incomplete     Scheduled Meds: . amLODipine  10 mg Oral Daily  . Chlorhexidine Gluconate Cloth  6 each Topical q morning - 10a  . clopidogrel  75 mg Oral Daily  . clotrimazole  1 Applicatorful Vaginal QHS  . Darbepoetin Alfa      . darbepoetin (ARANESP) injection - DIALYSIS  60 mcg Intravenous Q Thu-HD  . [START ON 06/07/2016] heparin  4,000 Units Dialysis Once in dialysis  . heparin  5,000 Units Subcutaneous Q8H  . hydrALAZINE  25 mg Oral Daily  . insulin aspart  0-5 Units Subcutaneous QHS  . insulin aspart  0-9 Units Subcutaneous TID WC  . isosorbide mononitrate  90 mg Oral Daily  .  lactulose  10 g Oral Daily  . levETIRAcetam  1,000 mg Oral Daily  . levETIRAcetam  500 mg Oral Q T,Th,Sat-1800  . lisinopril  10 mg Oral Daily  . metoprolol  100 mg Oral BID  . oxyCODONE      . pantoprazole  40 mg Oral BID AC  . polyethylene glycol  17 g Oral Daily    LOS: 6 days   Signature  Susa Raring K M.D on 06/06/2016 at 11:25 AM  Between 7am to 7pm - Pager - [phone], After 7pm go to [email or web address] - password [facility]  [facility]  - Office  [phone]

## 2016-06-07 ENCOUNTER — Ambulatory Visit: Admission: RE | Admit: 2016-06-07 | Payer: Medicare HMO | Source: Ambulatory Visit | Admitting: Vascular Surgery

## 2016-06-07 ENCOUNTER — Encounter: Admission: RE | Payer: Self-pay | Source: Ambulatory Visit

## 2016-06-07 ENCOUNTER — Inpatient Hospital Stay (HOSPITAL_COMMUNITY): Payer: Medicare HMO | Admitting: Anesthesiology

## 2016-06-07 ENCOUNTER — Encounter (HOSPITAL_COMMUNITY)
Admission: EM | Disposition: A | Discharge status: Skilled Nursing Facility | Payer: Self-pay | Source: Home / Self Care | Attending: Internal Medicine

## 2016-06-07 ENCOUNTER — Inpatient Hospital Stay (HOSPITAL_COMMUNITY): Payer: Medicare HMO

## 2016-06-07 ENCOUNTER — Encounter (HOSPITAL_COMMUNITY): Payer: Self-pay | Admitting: *Deleted

## 2016-06-07 DIAGNOSIS — R7881 Bacteremia: Secondary | ICD-10-CM

## 2016-06-07 HISTORY — PX: TEE WITHOUT CARDIOVERSION: SHX5443

## 2016-06-07 LAB — GLUCOSE, CAPILLARY
GLUCOSE-CAPILLARY: 149 mg/dL — AB (ref 65–99)
Glucose-Capillary: 126 mg/dL — ABNORMAL HIGH (ref 65–99)
Glucose-Capillary: 137 mg/dL — ABNORMAL HIGH (ref 65–99)

## 2016-06-07 SURGERY — ARTERIOVENOUS (AV) FISTULA CREATION
Anesthesia: General | Laterality: Right

## 2016-06-07 SURGERY — ECHOCARDIOGRAM, TRANSESOPHAGEAL
Anesthesia: Monitor Anesthesia Care

## 2016-06-07 MED ORDER — HEPARIN SODIUM (PORCINE) 1000 UNIT/ML DIALYSIS
4000.0000 [IU] | Freq: Once | INTRAMUSCULAR | Status: DC
  Filled 2016-06-07: qty 4

## 2016-06-07 MED ORDER — HEPARIN SODIUM (PORCINE) 1000 UNIT/ML DIALYSIS
2500.0000 [IU] | INTRAMUSCULAR | Status: DC | PRN
  Filled 2016-06-07: qty 3

## 2016-06-07 MED ORDER — LIDOCAINE HCL (PF) 1 % IJ SOLN: 5.0000 mL | INTRAMUSCULAR | Status: DC | PRN

## 2016-06-07 MED ORDER — SODIUM CHLORIDE 0.9 % IV SOLN: 100.0000 mL | INTRAVENOUS | Status: DC | PRN

## 2016-06-07 MED ORDER — HEPARIN SODIUM (PORCINE) 1000 UNIT/ML DIALYSIS
1000.0000 [IU] | INTRAMUSCULAR | Status: DC | PRN
  Filled 2016-06-07: qty 1

## 2016-06-07 MED ORDER — OXYCODONE HCL 5 MG PO TABS
5.0000 mg | ORAL_TABLET | Freq: Four times a day (QID) | ORAL | Status: DC | PRN
  Administered 2016-06-07: 5 mg via ORAL
  Filled 2016-06-07: qty 1

## 2016-06-07 MED ORDER — ALTEPLASE 2 MG IJ SOLR
2.0000 mg | Freq: Once | INTRAMUSCULAR | Status: DC | PRN
  Filled 2016-06-07: qty 2

## 2016-06-07 MED ORDER — LIDOCAINE HCL (CARDIAC) 20 MG/ML IV SOLN
INTRAVENOUS | Status: DC | PRN
  Administered 2016-06-07: 20 mg via INTRATRACHEAL

## 2016-06-07 MED ORDER — LIDOCAINE-PRILOCAINE 2.5-2.5 % EX CREA
1.0000 "application " | TOPICAL_CREAM | CUTANEOUS | Status: DC | PRN
  Filled 2016-06-07: qty 5

## 2016-06-07 MED ORDER — PROPOFOL 10 MG/ML IV BOLUS
INTRAVENOUS | Status: DC | PRN
  Administered 2016-06-07: 30 mg via INTRAVENOUS
  Administered 2016-06-07 (×2): 10 mg via INTRAVENOUS
  Administered 2016-06-07: 20 mg via INTRAVENOUS
  Administered 2016-06-07: 10 mg via INTRAVENOUS

## 2016-06-07 MED ORDER — PENTAFLUOROPROP-TETRAFLUOROETH EX AERO: 1.0000 "application " | INHALATION_SPRAY | CUTANEOUS | Status: DC | PRN

## 2016-06-07 MED ORDER — HYDRALAZINE HCL 50 MG PO TABS
50.0000 mg | ORAL_TABLET | Freq: Every day | ORAL | Status: DC
  Administered 2016-06-08 – 2016-06-11 (×4): 50 mg via ORAL
  Filled 2016-06-07 (×4): qty 1

## 2016-06-07 MED ORDER — SODIUM CHLORIDE 0.9 % IV SOLN
INTRAVENOUS | Status: DC | PRN
  Administered 2016-06-07: 11:00:00 via INTRAVENOUS

## 2016-06-07 NOTE — Anesthesia Postprocedure Evaluation (Signed)
Anesthesia Post Note  Patient: Kimberly Montgomery  Procedure(s) Performed: Procedure(s) (LRB): TRANSESOPHAGEAL ECHOCARDIOGRAM (TEE) (N/A)  Patient location during evaluation: PACU Anesthesia Type: MAC Level of consciousness: awake and alert Pain management: pain level controlled Vital Signs Assessment: post-procedure vital signs reviewed and stable Respiratory status: spontaneous breathing, nonlabored ventilation, respiratory function stable and patient connected to nasal cannula oxygen Cardiovascular status: stable and blood pressure returned to baseline Anesthetic complications: no       Last Vitals:  Vitals:   06/07/16 1158 06/07/16 1200  BP: (!) 177/70 (!) 216/62  Pulse: 68 67  Resp: 12 16  Temp: 36.9 C     Last Pain:  Vitals:   06/07/16 1158  TempSrc: Oral  PainSc:                  Tiajuana Amass

## 2016-06-07 NOTE — Anesthesia Preprocedure Evaluation (Addendum)
Anesthesia Evaluation  Patient identified by MRN, date of birth, ID band Patient awake    Reviewed: Allergy & Precautions, NPO status , Patient's Chart, lab work & pertinent test results  Airway Mallampati: II  TM Distance: >3 FB Neck ROM: Full    Dental  (+) Dental Advisory Given   Pulmonary former smoker,    breath sounds clear to auscultation       Cardiovascular hypertension, Pt. on medications and Pt. on home beta blockers + angina + CAD, + Past MI, + Cardiac Stents and +CHF   Rhythm:Regular Rate:Normal     Neuro/Psych  Headaches, Seizures -,  Anxiety Depression Bipolar Disorder    GI/Hepatic Neg liver ROS, hiatal hernia, GERD  ,  Endo/Other  diabetes, Type 2  Renal/GU CRFRenal disease     Musculoskeletal   Abdominal   Peds  Hematology  (+) anemia ,   Anesthesia Other Findings   Reproductive/Obstetrics                            Anesthesia Physical Anesthesia Plan  ASA: III  Anesthesia Plan: MAC   Post-op Pain Management:    Induction: Intravenous  Airway Management Planned: Natural Airway and Nasal Cannula  Additional Equipment:   Intra-op Plan:   Post-operative Plan:   Informed Consent: I have reviewed the patients History and Physical, chart, labs and discussed the procedure including the risks, benefits and alternatives for the proposed anesthesia with the patient or authorized representative who has indicated his/her understanding and acceptance.     Plan Discussed with: CRNA  Anesthesia Plan Comments:         Anesthesia Quick Evaluation

## 2016-06-07 NOTE — Progress Notes (Signed)
[facility] TEAM 1 - Stepdown/ICU TEAM  Kimberly Montgomery  ZOX:[id number] DOB: 28-Nov-1965 DOA: 05/31/2016 PCP: Jarome Matin, MD    Brief Narrative:  51 yo F who presented with altered mental status and seizure with HTN emergency (SBP 240).  Intubated for airway protection.  Had fever.  She has hx of seizures, ESRD on HD, DM, CHF.   Subjective:  The pt is awake, she is more awake, only minimally confused, able to answer all questions and follow commands, denies any headache, no chest or abdominal pain, no focal weakness.    Assessment & Plan:  Seizures  - LP unremarkable - CT/MRI head negative - Neuro following and directing medical tx, she is currently on Keppra and stable, her tremors were thought to be due to valproic acid side effect which has been discontinued. Continue to monitor.  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 vegetation on TTE from 3/05 under PCCM, hospitalist assumed care on 06/05/2016 cardiology ordered for TEE which will be arranged for 06/07/2016.  CAD w/ hx MI 2017 - On chronic ASA + Plavix  HTN -  BP variable - on Norvasc, ACE, doubled hydralazine, as needed IV hydralazine. Monitor.  Gastroparesis - resume Reglan before every meal  Bipolar D/O - Off Wellbutrin for now given seizure activity   Chronic headaches - On chronic percocet ???  Deconditioning - PT/OT assessment  DM 2 - CBG reasonably well controlled - follow w/o change in tx plan today   CBG (last 3)   Recent Labs  06/06/16 1617 06/06/16 2132 06/07/16 0746  GLUCAP 204* 187* 126*     Resolved problems:  Acute encephalopathy, Acute hypoxic respiratory failure - both due to seizures and resolved. Required intubation briefly.    DVT prophylaxis: SQ heparin  Code Status: FULL CODE Family Communication:  no family present at time of exam  Disposition Plan: stable for transfer to neuro tele bed   Significant Events: 3/2 admit - LP (unrevealing) 3/5 TTE EF 55-60% - ?mitral vegetation  3/7 TRH assumed care  3/9 TEE -   Consultants:  Nephrology PCCM Neurology   Anti-infectives    Start     Dose/Rate Route Frequency Ordered Stop   06/08/16 1200  [MAR Hold]  vancomycin (VANCOCIN) IVPB 1000 mg/200 mL premix     (MAR Hold since 06/07/16 1030)   1,000 mg 200 mL/hr over 60 Minutes Intravenous Every T-Th-Sa (Hemodialysis) 06/06/16 1340     06/06/16 1200  vancomycin (VANCOCIN) IVPB 1000 mg/200 mL premix  Status:  Discontinued     1,000 mg 200 mL/hr over 60 Minutes Intravenous Every T-Th-Sa (Hemodialysis) 06/04/16 1640 06/05/16 1456   06/04/16 1200  vancomycin (VANCOCIN) IVPB 1000 mg/200 mL premix  Status:  Discontinued     1,000 mg 200 mL/hr over 60 Minutes Intravenous Every T-Th-Sa (Hemodialysis) 06/02/16 1054 06/04/16 1527   06/03/16 1200  vancomycin (VANCOCIN) IVPB 1000 mg/200 mL premix     1,000 mg 200 mL/hr over 60 Minutes Intravenous Every M-W-F (Hemodialysis) 06/02/16 1054 06/03/16 1831   06/02/16 1200  vancomycin (VANCOCIN) 2,000 mg in sodium chloride 0.9 % 500 mL IVPB     2,000 mg 250 mL/hr over 120 Minutes Intravenous  Once 06/02/16 1054 06/02/16  1313   05/31/16 1200  meropenem (MERREM) 1 g in sodium chloride 0.9 % 100 mL IVPB     1 g 200 mL/hr over 30 Minutes Intravenous  Once 05/31/16 1151 05/31/16 1405   05/31/16 1145  vancomycin (VANCOCIN) 2,500 mg in sodium chloride 0.9 % 500 mL IVPB     2,500 mg 250 mL/hr over 120 Minutes Intravenous  Once 05/31/16 1142 05/31/16 1434       Objective:  Blood pressure (!) 219/62, pulse 72, temperature 98.3 F (36.8 C), temperature source Oral, resp. rate 14, height 5\' 9"  (1.753 m), weight 100.4 kg (221 lb 6.4 oz), last menstrual period 03/16/2015, SpO2 93 %.  Intake/Output Summary (Last 24 hours) at 06/07/16 1124 Last data filed at  06/06/16 2200  Gross per 24 hour  Intake              210 ml  Output             1864 ml  Net            -1654 ml   Filed Weights   06/06/16 0434 06/06/16 1223 06/07/16 0000  Weight: 90.8 kg (200 lb 3.2 oz) 89.4 kg (197 lb 1.5 oz) 100.4 kg (221 lb 6.4 oz)    Examination: General: No acute respiratory distress - alert but somewhat lethargic  Lungs: Clear to auscultation bilaterally without wheezes or crackles Cardiovascular: Regular rate and rhythm without murmur gallop or rub normal S1 and S2 Abdomen: Nontender, nondistended, soft, bowel sounds positive, no rebound, no ascites, no appreciable mass Extremities: No significant cyanosis, clubbing, or edema L LE   CBC:  Recent Labs Lab 06/01/16 0226 06/02/16 0241 06/03/16 0228 06/04/16 0311 06/06/16 0231  WBC 10.2 8.9 7.9 9.3 6.2  HGB 11.1* 11.8* 11.1* 11.2* 9.7*  HCT 33.6* 36.9 34.5* 35.6* 32.0*  MCV 86.4 88.9 87.8 90.4 90.4  PLT 236 273 306 255 266   Basic Metabolic Panel:  Recent Labs Lab 06/01/16 0226 06/02/16 0241 06/03/16 0228 06/04/16 0311 06/06/16 0231  NA 133* 136 137 136 136  K 3.6 3.5 4.2 3.5 3.7  CL 94* 100* 97* 98* 101  CO2 25 26 25 26 26   GLUCOSE 100* 116* 118* 123* 147*  BUN 39* 28* 39* 22* 22*  CREATININE 4.40* 4.24* 5.29* 3.82* 3.86*  CALCIUM 10.1 10.2 10.3 9.8 9.6  MG 2.1  --   --   --   --   PHOS 4.2  --   --  4.3  --    GFR: Estimated Creatinine Clearance: 21.5 mL/min (by C-G formula based on SCr of 3.86 mg/dL (H)).  Liver Function Tests:  Recent Labs Lab 06/04/16 0311 06/06/16 0231  AST  --  19  ALT  --  21  ALKPHOS  --  53  BILITOT  --  0.6  PROT  --  5.7*  ALBUMIN 3.5 3.1*    Recent Labs Lab 06/01/16 0910 06/06/16 0231  AMMONIA <9* 25    Coagulation Profile: No results for input(s): INR, PROTIME in the last 168 hours.  Cardiac Enzymes:  Recent Labs Lab 06/01/16 1552  CKTOTAL 24*    HbA1C: Hemoglobin A1C  Date/Time Value Ref Range Status  07/17/2014 04:17 AM  13.8 (H) % Final    Comment:    4.0-6.0 NOTE: New Reference Range  06/07/14   07/20/2013 07:05 AM 9.1 (H) 4.2 - 6.3 % Final    Comment:    The American Diabetes Association recommends that a primary  goal of therapy should be <7% and that physicians should reevaluate the treatment regimen in patients with HbA1c values consistently >8%.    Hgb A1c MFr Bld  Date/Time Value Ref Range Status  09/13/2015 02:27 AM 6.2 (H) 4.0 - 6.0 % Final  07/18/2015 04:55 AM 8.6 (H) 4.0 - 6.0 % Final    CBG:  Recent Labs Lab 06/06/16 0744 06/06/16 1259 06/06/16 1617 06/06/16 2132 06/07/16 0746  GLUCAP 137* 116* 204* 187* 126*    Recent Results (from the past 240 hour(s))  Anaerobic culture     Status: None   Collection Time: 05/31/16  1:27 PM  Result Value Ref Range Status   Specimen Description CSF  Final   Special Requests NONE  Final   Culture NO ANAEROBES ISOLATED  Final   Report Status 06/05/2016 FINAL  Final  CSF culture     Status: None   Collection Time: 05/31/16  1:27 PM  Result Value Ref Range Status   Specimen Description CSF  Final   Special Requests NONE  Final   Gram Stain   Final    WBC PRESENT, PREDOMINANTLY MONONUCLEAR NO ORGANISMS SEEN CYTOSPIN SMEAR    Culture NO GROWTH 3 DAYS  Final   Report Status 06/03/2016 FINAL  Final  Culture, blood (Routine X 2) w Reflex to ID Panel     Status: None   Collection Time: 05/31/16  5:00 PM  Result Value Ref Range Status   Specimen Description BLOOD RIGHT WRIST  Final   Special Requests IN PEDIATRIC BOTTLE 2CC  Final   Culture NO GROWTH 5 DAYS  Final   Report Status 06/05/2016 FINAL  Final  Culture, blood (Routine X 2) w Reflex to ID Panel     Status: Abnormal   Collection Time: 05/31/16  5:03 PM  Result Value Ref Range Status   Specimen Description BLOOD RIGHT HAND  Final   Special Requests IN PEDIATRIC BOTTLE 2CC  Final   Culture  Setup Time   Final    GRAM POSITIVE COCCI IN CLUSTERS AEROBIC BOTTLE ONLY CRITICAL  RESULT CALLED TO, READ BACK BY AND VERIFIED WITH: M SHUDA 06/02/16 @ 0840 M VESTAL    Culture (A)  Final    STAPHYLOCOCCUS SPECIES (COAGULASE NEGATIVE) THE SIGNIFICANCE OF ISOLATING THIS ORGANISM FROM A SINGLE SET OF BLOOD CULTURES WHEN MULTIPLE SETS ARE DRAWN IS UNCERTAIN. PLEASE NOTIFY THE MICROBIOLOGY DEPARTMENT WITHIN ONE WEEK IF SPECIATION AND SENSITIVITIES ARE REQUIRED.    Report Status 06/04/2016 FINAL  Final  Blood Culture ID Panel (Reflexed)     Status: Abnormal   Collection Time: 05/31/16  5:03 PM  Result Value Ref Range Status   Enterococcus species NOT DETECTED NOT DETECTED Final   Listeria monocytogenes NOT DETECTED NOT DETECTED Final   Staphylococcus species DETECTED (A) NOT DETECTED Final    Comment: Methicillin (oxacillin) resistant coagulase negative staphylococcus. Possible blood culture contaminant (unless isolated from more than one blood culture draw or clinical case suggests pathogenicity). No antibiotic treatment is indicated for blood  culture contaminants. CRITICAL RESULT CALLED TO, READ BACK BY AND VERIFIED WITH: M SHUDA 06/02/16 @ 0840 M VESTAL    Staphylococcus aureus NOT DETECTED NOT DETECTED Final   Methicillin resistance DETECTED (A) NOT DETECTED Final    Comment: CRITICAL RESULT CALLED TO, READ BACK BY AND VERIFIED WITH: M SHUDA 06/02/16 @ 0840 M VESTAL    Streptococcus species NOT DETECTED NOT DETECTED Final   Streptococcus agalactiae NOT DETECTED NOT DETECTED Final   Streptococcus  pneumoniae NOT DETECTED NOT DETECTED Final   Streptococcus pyogenes NOT DETECTED NOT DETECTED Final   Acinetobacter baumannii NOT DETECTED NOT DETECTED Final   Enterobacteriaceae species NOT DETECTED NOT DETECTED Final   Enterobacter cloacae complex NOT DETECTED NOT DETECTED Final   Escherichia coli NOT DETECTED NOT DETECTED Final   Klebsiella oxytoca NOT DETECTED NOT DETECTED Final   Klebsiella pneumoniae NOT DETECTED NOT DETECTED Final   Proteus species NOT DETECTED NOT  DETECTED Final   Serratia marcescens NOT DETECTED NOT DETECTED Final   Haemophilus influenzae NOT DETECTED NOT DETECTED Final   Neisseria meningitidis NOT DETECTED NOT DETECTED Final   Pseudomonas aeruginosa NOT DETECTED NOT DETECTED Final   Candida albicans NOT DETECTED NOT DETECTED Final   Candida glabrata NOT DETECTED NOT DETECTED Final   Candida krusei NOT DETECTED NOT DETECTED Final   Candida parapsilosis NOT DETECTED NOT DETECTED Final   Candida tropicalis NOT DETECTED NOT DETECTED Final  MRSA PCR Screening     Status: None   Collection Time: 05/31/16  7:29 PM  Result Value Ref Range Status   MRSA by PCR NEGATIVE NEGATIVE Final    Comment:        The GeneXpert MRSA Assay (FDA approved for NASAL specimens only), is one component of a comprehensive MRSA colonization surveillance program. It is not intended to diagnose MRSA infection nor to guide or monitor treatment for MRSA infections.   Culture, respiratory (NON-Expectorated)     Status: None   Collection Time: 05/31/16 11:12 PM  Result Value Ref Range Status   Specimen Description TRACHEAL ASPIRATE  Final   Special Requests NONE  Final   Gram Stain   Final    RARE WBC PRESENT, PREDOMINANTLY PMN RARE SQUAMOUS EPITHELIAL CELLS PRESENT RARE GRAM POSITIVE COCCOBACILLUS    Culture Consistent with normal respiratory flora.  Final   Report Status 06/03/2016 FINAL  Final  Culture, blood (Routine X 2) w Reflex to ID Panel     Status: None (Preliminary result)   Collection Time: 06/03/16  9:45 AM  Result Value Ref Range Status   Specimen Description BLOOD RIGHT HAND  Final   Special Requests IN PEDIATRIC BOTTLE 2CC  Final   Culture NO GROWTH 3 DAYS  Final   Report Status PENDING  Incomplete  Culture, blood (Routine X 2) w Reflex to ID Panel     Status: None (Preliminary result)   Collection Time: 06/03/16 10:20 AM  Result Value Ref Range Status   Specimen Description BLOOD LEFT HAND  Final   Special Requests BOTTLES DRAWN  AEROBIC AND ANAEROBIC 5CC  Final   Culture NO GROWTH 3 DAYS  Final   Report Status PENDING  Incomplete     Scheduled Meds: . [MAR Hold] amLODipine  10 mg Oral Daily  . [MAR Hold] Chlorhexidine Gluconate Cloth  6 each Topical q morning - 10a  . [MAR Hold] clopidogrel  75 mg Oral Daily  . [MAR Hold] clotrimazole  1 Applicatorful Vaginal QHS  . [MAR Hold] darbepoetin (ARANESP) injection - DIALYSIS  60 mcg Intravenous Q Thu-HD  . [MAR Hold] feeding supplement (NEPRO CARB STEADY)  237 mL Oral BID WC  . [MAR Hold] heparin  4,000 Units Dialysis Once in dialysis  . [MAR Hold] heparin  5,000 Units Subcutaneous Q8H  . [MAR Hold] hydrALAZINE  25 mg Oral Daily  . [MAR Hold] insulin aspart  0-5 Units Subcutaneous QHS  . [MAR Hold] insulin aspart  0-9 Units Subcutaneous TID WC  . [facility] Hold]  isosorbide mononitrate  90 mg Oral Daily  . [MAR Hold] lactulose  10 g Oral Daily  . [MAR Hold] levETIRAcetam  1,000 mg Oral Daily  . [MAR Hold] levETIRAcetam  500 mg Oral Q T,Th,Sat-1800  . [MAR Hold] lisinopril  10 mg Oral Daily  . [MAR Hold] metoprolol  100 mg Oral BID  . [MAR Hold] pantoprazole  40 mg Oral BID AC  . [MAR Hold] polyethylene glycol  17 g Oral Daily  . [MAR Hold] vancomycin  1,000 mg Intravenous Q T,Th,Sa-HD    LOS: 7 days   Signature  Susa Raring K M.D on 06/07/2016 at 11:24 AM  Between 7am to 7pm - Pager - [phone], After 7pm go to [email or web address] - password [facility]  [facility]  - Office  [phone]

## 2016-06-07 NOTE — Progress Notes (Signed)
PT Cancellation Note  Patient Details Name: Kimberly Montgomery MRN: [id number] DOB: 06-08-1965   Cancelled Treatment:    Reason Eval/Treat Not Completed: Patient at procedure or test/unavailable (pt off floor for TEE, will follow. )   Tamala Ser 06/07/2016, 11:02 AM [phone]

## 2016-06-07 NOTE — Progress Notes (Signed)
  Speech Language Pathology Treatment: Dysphagia  Patient Details Name: Kimberly Montgomery MRN: [id number] DOB: [birth year] Today's Date: 06/07/2016 Time: [id number] SLP Time Calculation (min) (ACUTE ONLY): 15 min  Assessment / Plan / Recommendation Clinical Impression  Patient seen for dysphagia treatment. Recently returned from transesophageal echocardiogram, was sedated with propofol and states her eyelids feel heavy. Follow-up for tolerance of dys 3 diet with thin liquids, provided upgrade trials of regular solids. Provided education regarding results of MBS; patient verbalizes understanding. Functional mastication and clearance of regular solids with thin liquid wash, however patient states, "my teeth hurt" while masticating. No overt signs of aspiration noted. Given sedation and pain with mastication, recommend continuing dysphagia 3 with thin liquids via cup, no straws, medications whole in puree. SLP will f/u for upgrade of solids as appropriate.    HPI HPI: Pt is a 51 year old female with PMH of seizure (on keppra), ESRD-HD, DM, CHF, GERD, HLD, and CAD who presented to ED with seizure and AMS. Patient was intubated for inability to protect her airway and PCCM was called to admit. Intubated 05/31/16-06/03/16. CXR showed mildly increased left basilar density suggests subsegmental atelectasis. A trace left pleural effusion may be present. There is no pulmonary edema. Thoracic aortic atherosclerosis. MRI of brain showed no acute intracranial abnormality. Remote lacunar infarcts within the posterior circulation. Mild generalized atrophy and white matter disease is advanced for age. Bedside swallow eval 06/04/16-recommended Dys 3, nectar thick (no straws), meds whole in puree.      SLP Plan  Continue with current plan of care       Recommendations  Diet recommendations: Dysphagia 3 (mechanical soft);Thin liquid Liquids provided via: Cup;No straw Medication Administration: Whole meds with  puree Supervision: Patient able to self feed Compensations: Slow rate;Small sips/bites Postural Changes and/or Swallow Maneuvers: Seated upright 90 degrees                Oral Care Recommendations: Oral care BID Follow up Recommendations: Skilled Nursing facility SLP Visit Diagnosis: Dysphagia, pharyngeal phase (R13.13) Plan: Continue with current plan of care       GO              Rondel Baton, MS CF-SLP Speech-Language Pathologist [phone]  Arlana Lindau 06/07/2016, 4:22 PM

## 2016-06-07 NOTE — Transfer of Care (Signed)
Immediate Anesthesia Transfer of Care Note  Patient: Kimberly Montgomery  Procedure(s) Performed: Procedure(s): TRANSESOPHAGEAL ECHOCARDIOGRAM (TEE) (N/A)  Patient Location: Endoscopy Unit  Anesthesia Type:MAC  Level of Consciousness: awake and alert   Airway & Oxygen Therapy: Patient Spontanous Breathing and Patient connected to nasal cannula oxygen  Post-op Assessment: Report given to RN and Post -op Vital signs reviewed and stable  Post vital signs: Reviewed and stable  Last Vitals:  Vitals:   06/07/16 1158 06/07/16 1200  BP: (!) 177/70 (!) 216/62  Pulse: 68 67  Resp: 12 16  Temp: 36.9 C     Last Pain:  Vitals:   06/07/16 1158  TempSrc: Oral  PainSc:       Patients Stated Pain Goal: 0 (63/01/60 1093)  Complications: No apparent anesthesia complications

## 2016-06-07 NOTE — Progress Notes (Signed)
[facility] Progress Note   Subjective: more alert and interactive  Vitals:   06/06/16 2200 06/07/16 0000 06/07/16 0430 06/07/16 1031  BP: (!) 154/57 (!) 175/72 (!) 163/63 (!) 219/62  Pulse: 64 68 63 72  Resp: 18 18 18 14   Temp:  98.2 F (36.8 C) 98.5 F (36.9 C) 98.3 F (36.8 C)  TempSrc:  Oral Oral Oral  SpO2: 97% 97% 98% 93%  Weight:  100.4 kg (221 lb 6.4 oz)    Height:  5\' 9"  (1.753 m)      Inpatient medications: . [MAR Hold] amLODipine  10 mg Oral Daily  . [MAR Hold] Chlorhexidine Gluconate Cloth  6 each Topical q morning - 10a  . [MAR Hold] clopidogrel  75 mg Oral Daily  . [MAR Hold] clotrimazole  1 Applicatorful Vaginal QHS  . [MAR Hold] darbepoetin (ARANESP) injection - DIALYSIS  60 mcg Intravenous Q Thu-HD  . [MAR Hold] feeding supplement (NEPRO CARB STEADY)  237 mL Oral BID WC  . [MAR Hold] heparin  4,000 Units Dialysis Once in dialysis  . [MAR Hold] heparin  5,000 Units Subcutaneous Q8H  . [START ON 06/08/2016] hydrALAZINE  50 mg Oral Daily  . [MAR Hold] insulin aspart  0-5 Units Subcutaneous QHS  . [MAR Hold] insulin aspart  0-9 Units Subcutaneous TID WC  . [MAR Hold] isosorbide mononitrate  90 mg Oral Daily  . [MAR Hold] lactulose  10 g Oral Daily  . [MAR Hold] levETIRAcetam  1,000 mg Oral Daily  . [MAR Hold] levETIRAcetam  500 mg Oral Q T,Th,Sat-1800  . [MAR Hold] lisinopril  10 mg Oral Daily  . [MAR Hold] metoprolol  100 mg Oral BID  . [MAR Hold] pantoprazole  40 mg Oral BID AC  . [MAR Hold] polyethylene glycol  17 g Oral Daily  . [MAR Hold] vancomycin  1,000 mg Intravenous Q T,Th,Sa-HD    [MAR Hold] sodium chloride, [MAR Hold] sodium chloride, [MAR Hold] alteplase, [MAR Hold] alum & mag hydroxide-simeth, [MAR Hold] fentaNYL (SUBLIMAZE) injection, [MAR Hold] heparin, [MAR Hold] hydrALAZINE, [MAR Hold] lidocaine (PF), [MAR Hold] lidocaine-prilocaine, [MAR Hold] loratadine, [MAR Hold] olopatadine, oxyCODONE, [MAR Hold]  pentafluoroprop-tetrafluoroeth  Exam: No distress, chron ill appearing Head/ lip tremors cont to improve Slow speech No jvd Chest clear bilat RRR Abd obese, soft ntnd Ext 1-2 +pitting edema Neuro more alert, gen'd weakness, conversant, Ox 3 L IJ TDC  Dialysis: [facility] [place] on [address]/ TTS 4h  108kg  2K bath  Hep 2000 then 900/hr   Permcath  - EPO 6000 tiw  - Iron 100 biw      Assessment: 1. Seizures - hx of seizures in the past. LP neg.  Head CT/ MRI neg. Is on Keppra, as prior to admission.  2. ESRD HD TTS, on HD 3 yrs f/b [facility] w dialysis in [place]. Never has had a perm access.  3. HTN - on 4 BP meds at home and here, BP's good 4. Volume - sig under prior dry wt, still some edema but BP's dropping on HD 5. Anemia of CKD - Hb good at 11 6. EPS - due to risperdal/ reglan, both have been dc'd, improved 7. Debility/ EOL - has been bedridden for about 1 year, in and out of SNF's and hospitals per pt for the last year.  Prior to that was up in a wheelchair and using RLE prosthesis.  Feels her QOL is fairly good and wants to continue all medical treatments.   Plan -  HD tomorrow  Vinson Moselle MD [facility] pager [phone]   06/07/2016, 11:55 AM    Recent Labs Lab 06/01/16 0226  06/03/16 0228 06/04/16 0311 06/06/16 0231  NA 133*  < > 137 136 136  K 3.6  < > 4.2 3.5 3.7  CL 94*  < > 97* 98* 101  CO2 25  < > 25 26 26   GLUCOSE 100*  < > 118* 123* 147*  BUN 39*  < > 39* 22* 22*  CREATININE 4.40*  < > 5.29* 3.82* 3.86*  CALCIUM 10.1  < > 10.3 9.8 9.6  PHOS 4.2  --   --  4.3  --   < > = values in this interval not displayed.  Recent Labs Lab 06/04/16 0311 06/06/16 0231  AST  --  19  ALT  --  21  ALKPHOS  --  53  BILITOT  --  0.6  PROT  --  5.7*  ALBUMIN 3.5 3.1*    Recent Labs Lab 06/03/16 0228 06/04/16 0311 06/06/16 0231  WBC 7.9 9.3 6.2  HGB 11.1* 11.2* 9.7*  HCT 34.5* 35.6* 32.0*  MCV 87.8 90.4 90.4  PLT 306 255 266    Iron/TIBC/Ferritin/ %Sat    Component Value Date/Time   IRON 24 (L) 07/12/2013 0509   TIBC 214 (L) 07/12/2013 0509   FERRITIN 183 07/12/2013 0509   IRONPCTSAT 11 07/12/2013 0509

## 2016-06-07 NOTE — H&P (Signed)
    INTERVAL PROCEDURE H&P  History and Physical Interval Note:  06/07/2016 11:19 AM  Kimberly Montgomery has presented today for their planned procedure. The various methods of treatment have been discussed with the patient and family. After consideration of risks, benefits and other options for treatment, the patient has consented to the procedure.  The patients' outpatient history has been reviewed, patient examined, and no change in status from most recent office note within the past 30 days. I have reviewed the patients' chart and labs and will proceed as planned. Questions were answered to the patient's satisfaction.   Kimberly Nose, MD, [facility] Attending Cardiologist [facility]  Kimberly Montgomery 06/07/2016, 11:19 AM

## 2016-06-07 NOTE — Progress Notes (Signed)
OT Cancellation Note  Patient Details Name: Kimberly Montgomery MRN: [id number] DOB: 1966/03/21   Cancelled Treatment:    Reason Eval/Treat Not Completed: Patient at procedure or test/ unavailable. Pt at endoscopy. Will check back as time allows.  Kolby Shad Briellah Baik 06/07/2016, 12:03 PM  Sherryl Manges OTR/L [phone]

## 2016-06-07 NOTE — Progress Notes (Signed)
    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  After careful review of history and examination, the risks and benefits of transesophageal echocardiogram have been explained including risks of esophageal damage, perforation (1:10,000 risk), bleeding, pharyngeal hematoma as well as other potential complications associated with conscious sedation including aspiration, arrhythmia, respiratory failure and death. Alternatives to treatment were discussed, questions were answered. Patient is willing to proceed.   Corine Shelter, [place]  06/07/2016 9:21 AM

## 2016-06-07 NOTE — CV Procedure (Signed)
TRANSESOPHAGEAL ECHOCARDIOGRAM (TEE) NOTE  INDICATIONS: infective endocarditis  PROCEDURE:   Informed consent was obtained prior to the procedure. The risks, benefits and alternatives for the procedure were discussed and the patient comprehended these risks.  Risks include, but are not limited to, cough, sore throat, vomiting, nausea, somnolence, esophageal and stomach trauma or perforation, bleeding, low blood pressure, aspiration, pneumonia, infection, trauma to the teeth and death.    After a procedural time-out, the patient was given Propofol per anesthesia for sedation.  The patient's heart rate, blood pressure, and oxygen saturation are monitored continuously during the procedure. The transesophageal probe was inserted in the esophagus and stomach without difficulty and multiple views were obtained.  The patient was kept under observation until the patient left the procedure room.The patient left the procedure room in stable condition.   Agitated microbubble saline contrast was administered.  COMPLICATIONS:    There were no immediate complications.  Findings:  1. LEFT VENTRICLE: The left ventricular wall thickness is normal.  The left ventricular cavity is normal in size. Wall motion is normal.  LVEF is 55-60%.  2. RIGHT VENTRICLE:  The right ventricle is normal in structure and function without any thrombus or masses.    3. LEFT ATRIUM:  The left atrium is normal in size without any thrombus or masses.  There is not spontaneous echo contrast ("smoke") in the left atrium consistent with a low flow state.  4. LEFT ATRIAL APPENDAGE:  The left atrial appendage is free of any thrombus or masses. The appendage has single lobes. Pulse doppler indicates high flow in the appendage.  5. ATRIAL SEPTUM:  The atrial septum appears intact and is free of thrombus and/or masses.  There is no evidence for interatrial shunting by color doppler and saline microbubble.  6. RIGHT ATRIUM:  The  right atrium is normal in size and function without any thrombus or masses.  7. MITRAL VALVE:  The mitral valve is normal in structure and function with no regurgitation.  There were no vegetations or stenosis. There is moderate posterior mitral annular calcification with a <1 cm pedunculated echogenic mass noted adjacent to the P2 segment, suggestive of either calcified (organized) thrombus or old vegetation which is calcified.  8. AORTIC VALVE:  The aortic valve is trileaflet, normal in structure and function with no regurgitation.  There were no vegetations or stenosis  9. TRICUSPID VALVE:  The tricuspid valve is normal in structure and function with trivial regurgitation.  There were no vegetations or stenosis  10.  PULMONIC VALVE:  The pulmonic valve is normal in structure and function with no regurgitation.  There were no vegetations or stenosis.   11. AORTIC ARCH, ASCENDING AND DESCENDING AORTA:  There was grade 1 Ron Parker et. Al, 1992) atherosclerosis of the ascending aorta, aortic arch, or proximal descending aorta.  12. PULMONARY VEINS: Anomalous pulmonary venous return was not noted.  13. PERICARDIUM: The pericardium appeared normal and non-thickened.  There is a no pericardial effusion.  IMPRESSION:   1. Moderate posterior mitral annular calcification (MAC) with a small <1 cm pedunculated mass adherent to the annulus, Iikely representing old calcified thrombus or old vegetation. No evidence for acute valvular endocarditis. 2. Negative for PFO 3. No LAA thrombus 4. LVEF 55-60%  RECOMMENDATIONS:    1. No evidence for acute infective endocarditis. Small calcified mass adherent to the posterior annulus which likely represents old calcified thrombus or old vegetation.  Time Spent Directly with the Patient:  45 minutes  Pixie Casino, MD, [facility] Attending Cardiologist [facility]  06/07/2016, 12:02 PM

## 2016-06-08 LAB — GLUCOSE, CAPILLARY
GLUCOSE-CAPILLARY: 215 mg/dL — AB (ref 65–99)
Glucose-Capillary: 119 mg/dL — ABNORMAL HIGH (ref 65–99)
Glucose-Capillary: 146 mg/dL — ABNORMAL HIGH (ref 65–99)
Glucose-Capillary: 140 mg/dL — ABNORMAL HIGH (ref 65–99)

## 2016-06-08 LAB — CULTURE, BLOOD (ROUTINE X 2)
CULTURE: NO GROWTH
CULTURE: NO GROWTH

## 2016-06-08 MED ORDER — OXYCODONE HCL 5 MG PO TABS
ORAL_TABLET | ORAL | Status: AC
  Filled 2016-06-08: qty 1

## 2016-06-08 MED ORDER — OXYCODONE HCL 5 MG PO TABS
5.0000 mg | ORAL_TABLET | Freq: Two times a day (BID) | ORAL | Status: DC | PRN
  Administered 2016-06-08 – 2016-06-11 (×6): 5 mg via ORAL
  Filled 2016-06-08 (×5): qty 1

## 2016-06-08 MED ORDER — VANCOMYCIN HCL IN DEXTROSE 1-5 GM/200ML-% IV SOLN
INTRAVENOUS | Status: AC
  Administered 2016-06-08: 1 g
  Filled 2016-06-08: qty 200

## 2016-06-08 MED ORDER — RISPERIDONE 0.5 MG PO TABS
1.0000 mg | ORAL_TABLET | Freq: Two times a day (BID) | ORAL | Status: DC
  Administered 2016-06-10 – 2016-06-11 (×3): 1 mg via ORAL
  Filled 2016-06-08 (×4): qty 2

## 2016-06-08 NOTE — Progress Notes (Signed)
[facility] Progress Note   Subjective: no complaints  Vitals:   06/08/16 1000 06/08/16 1030 06/08/16 1100 06/08/16 1130  BP: (!) 174/79 138/66 122/74 (!) 147/60  Pulse: (!) 57 (!) 53 (!) 59 (!) 58  Resp: 18 17 18 17   Temp:      TempSrc:      SpO2:      Weight:      Height:        Inpatient medications: . amLODipine  10 mg Oral Daily  . clopidogrel  75 mg Oral Daily  . clotrimazole  1 Applicatorful Vaginal QHS  . darbepoetin (ARANESP) injection - DIALYSIS  60 mcg Intravenous Q Thu-HD  . feeding supplement (NEPRO CARB STEADY)  237 mL Oral BID WC  . heparin  4,000 Units Dialysis Once in dialysis  . heparin  4,000 Units Dialysis Once in dialysis  . heparin  5,000 Units Subcutaneous Q8H  . hydrALAZINE  50 mg Oral Daily  . insulin aspart  0-5 Units Subcutaneous QHS  . insulin aspart  0-9 Units Subcutaneous TID WC  . isosorbide mononitrate  90 mg Oral Daily  . lactulose  10 g Oral Daily  . levETIRAcetam  1,000 mg Oral Daily  . levETIRAcetam  500 mg Oral Q T,Th,Sat-1800  . lisinopril  10 mg Oral Daily  . metoprolol  100 mg Oral BID  . pantoprazole  40 mg Oral BID AC  . polyethylene glycol  17 g Oral Daily    sodium chloride, sodium chloride, alteplase, alum & mag hydroxide-simeth, fentaNYL (SUBLIMAZE) injection, heparin, heparin, hydrALAZINE, lidocaine (PF), lidocaine (PF), lidocaine-prilocaine, loratadine, olopatadine, oxyCODONE, pentafluoroprop-tetrafluoroeth, pentafluoroprop-tetrafluoroeth  Exam: No distress, chron ill appearing Head/ lip tremors cont to improve Slow speech No jvd Chest clear bilat RRR Abd obese, soft ntnd Ext 1-2 +pitting edema Neuro more alert, gen'd weakness, conversant, Ox 3 L IJ TDC  Dialysis: [facility] on [address]/ TTS 4h  108kg  2K bath  Hep 2000 then 900/hr   Permcath  - EPO 6000 tiw  - Iron 100 biw      Assessment: 1. Seizures - hx of seizures in the past. LP neg.  Head CT/ MRI neg. On Keppra, as prior to  admission.  2. ESRD HD TTS, on HD 3 yrs f/b [facility] w dialysis in [place]. Never has had a perm access.  3. HTN - on 4 BP meds at home and here, BP's good 4. Volume - prob euvolemic, under dry wt by 7-8kg 5. Anemia of CKD - Hb good at 11 6. EPS - due to risperdal/ reglan, both have been dc'd, improved 7. Debility/ EOL - has been bedridden for about 1 year, in and out of SNF's and hospitals per pt for the last year. Feels her QOL is fairly good and wants to continue all medical treatments.   Plan - HD today , UF 1-2kg as Caryl Pina MD [facility] pager [phone]   06/08/2016, 12:08 PM    Recent Labs Lab 06/03/16 0228 06/04/16 0311 06/06/16 0231  NA 137 136 136  K 4.2 3.5 3.7  CL 97* 98* 101  CO2 25 26 26   GLUCOSE 118* 123* 147*  BUN 39* 22* 22*  CREATININE 5.29* 3.82* 3.86*  CALCIUM 10.3 9.8 9.6  PHOS  --  4.3  --     Recent Labs Lab 06/04/16 0311 06/06/16 0231  AST  --  19  ALT  --  21  ALKPHOS  --  53  BILITOT  --  0.6  PROT  --  5.7*  ALBUMIN 3.5 3.1*    Recent Labs Lab 06/03/16 0228 06/04/16 0311 06/06/16 0231  WBC 7.9 9.3 6.2  HGB 11.1* 11.2* 9.7*  HCT 34.5* 35.6* 32.0*  MCV 87.8 90.4 90.4  PLT 306 255 266   Iron/TIBC/Ferritin/ %Sat    Component Value Date/Time   IRON 24 (L) 07/12/2013 0509   TIBC 214 (L) 07/12/2013 0509   FERRITIN 183 07/12/2013 0509   IRONPCTSAT 11 07/12/2013 0509

## 2016-06-08 NOTE — Progress Notes (Signed)
[place] TEAM 1 - Stepdown/ICU TEAM  Kimberly Montgomery  ZOX:[id number] DOB: 1966/03/29 DOA: 05/31/2016 PCP: Jarome Matin, MD    Brief Narrative:  51 yo F who presented with altered mental status and seizure with HTN emergency (SBP 240).  Intubated for airway protection.  Had fever.  She has hx of seizures, ESRD on HD, DM, CHF.   Subjective:  The pt is awake, she is more awake, only minimally confused, able to answer all questions and follow commands, denies any headache, no chest or abdominal pain, no focal weakness.    Assessment & Plan:  Seizures  - LP unremarkable - CT/MRI head negative - Neuro following and directing medical tx, she is currently on Keppra and stable, her tremors were thought to be due to valproic acid side effect which has been discontinued. Continue to monitor.  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 Luciana Axe. Stop all antibiotics and monitor clinically and repeat blood cultures on 06/09/2016, if blood cultures remain negative and she mounts no fever in the next 1-2 weeks then the finding on echocardiogram is likely old blood clot.  CAD w/ hx MI 2017 - On chronic ASA + Plavix  HTN -  BP variable - on Norvasc, ACE, doubled hydralazine, as needed IV hydralazine. Monitor.  Gastroparesis - resume Reglan before every meal  Bipolar D/O - Off Wellbutrin for now given seizure activity   Chronic headaches - On chronic percocet ???  Deconditioning - PT/OT assessment  DM 2 - CBG reasonably well controlled - follow w/o change in tx plan today   CBG (last 3)   Recent Labs  06/07/16 2114 06/08/16 0637 06/08/16 0640  GLUCAP 149* 215* 119*     Resolved problems:  Acute encephalopathy, Acute hypoxic respiratory  failure - both due to seizures and resolved. Required intubation briefly.    DVT prophylaxis: SQ heparin  Code Status: FULL CODE Family Communication: no family present at time of exam  Disposition Plan: stable for transfer to neuro tele bed   Significant Events: 3/2 admit - LP (unrevealing) 3/5 TTE EF 55-60% - ? Mitral vegetation  3/7 TRH assumed care  3/9 TEE - There was no evidence of an acute valvular vegetation. Mitral annulur calcification with possibly a calcified thrombus or old vegetation which is calcified on the mitral annulus.  Consultants:  Nephrology PCCM Neurology   Anti-infectives    Start     Dose/Rate Route Frequency Ordered Stop   06/08/16 1200  vancomycin (VANCOCIN) IVPB 1000 mg/200 mL premix  Status:  Discontinued     1,000 mg 200 mL/hr over 60 Minutes Intravenous Every T-Th-Sa (Hemodialysis) 06/06/16 1340 06/07/16 1305   06/06/16 1200  vancomycin (VANCOCIN) IVPB 1000 mg/200 mL premix  Status:  Discontinued     1,000 mg 200 mL/hr over 60 Minutes Intravenous Every T-Th-Sa (Hemodialysis) 06/04/16 1640 06/05/16 1456   06/04/16 1200  vancomycin (VANCOCIN) IVPB 1000 mg/200 mL premix  Status:  Discontinued     1,000 mg 200 mL/hr over 60 Minutes Intravenous Every T-Th-Sa (Hemodialysis) 06/02/16 1054 06/04/16 1527   06/03/16 1200  vancomycin (VANCOCIN) IVPB 1000 mg/200 mL premix     1,000 mg 200 mL/hr over  60 Minutes Intravenous Every M-W-F (Hemodialysis) 06/02/16 1054 06/03/16 1831   06/02/16 1200  vancomycin (VANCOCIN) 2,000 mg in sodium chloride 0.9 % 500 mL IVPB     2,000 mg 250 mL/hr over 120 Minutes Intravenous  Once 06/02/16 1054 06/02/16 1313   05/31/16 1200  meropenem (MERREM) 1 g in sodium chloride 0.9 % 100 mL IVPB     1 g 200 mL/hr over 30 Minutes Intravenous  Once 05/31/16 1151 05/31/16 1405   05/31/16 1145  vancomycin (VANCOCIN) 2,500 mg in sodium chloride 0.9 % 500 mL IVPB     2,500 mg 250 mL/hr over 120 Minutes Intravenous  Once 05/31/16 1142  05/31/16 1434       Objective:  Blood pressure (!) 196/73, pulse 82, temperature 98.2 F (36.8 C), temperature source Oral, resp. rate 20, height 5\' 9"  (1.753 m), weight 100.4 kg (221 lb 6.4 oz), last menstrual period 03/16/2015, SpO2 95 %. No intake or output data in the 24 hours ending 06/08/16 0947 Filed Weights   06/06/16 0434 06/06/16 1223 06/07/16 0000  Weight: 90.8 kg (200 lb 3.2 oz) 89.4 kg (197 lb 1.5 oz) 100.4 kg (221 lb 6.4 oz)    Examination: General: No acute respiratory distress - alert but somewhat lethargic  Lungs: Clear to auscultation bilaterally without wheezes or crackles Cardiovascular: Regular rate and rhythm without murmur gallop or rub normal S1 and S2 Abdomen: Nontender, nondistended, soft, bowel sounds positive, no rebound, no ascites, no appreciable mass Extremities: No significant cyanosis, clubbing, or edema L LE   CBC:  Recent Labs Lab 06/02/16 0241 06/03/16 0228 06/04/16 0311 06/06/16 0231  WBC 8.9 7.9 9.3 6.2  HGB 11.8* 11.1* 11.2* 9.7*  HCT 36.9 34.5* 35.6* 32.0*  MCV 88.9 87.8 90.4 90.4  PLT 273 306 255 266   Basic Metabolic Panel:  Recent Labs Lab 06/02/16 0241 06/03/16 0228 06/04/16 0311 06/06/16 0231  NA 136 137 136 136  K 3.5 4.2 3.5 3.7  CL 100* 97* 98* 101  CO2 26 25 26 26   GLUCOSE 116* 118* 123* 147*  BUN 28* 39* 22* 22*  CREATININE 4.24* 5.29* 3.82* 3.86*  CALCIUM 10.2 10.3 9.8 9.6  PHOS  --   --  4.3  --    GFR: Estimated Creatinine Clearance: 21.5 mL/min (by C-G formula based on SCr of 3.86 mg/dL (H)).  Liver Function Tests:  Recent Labs Lab 06/04/16 0311 06/06/16 0231  AST  --  19  ALT  --  21  ALKPHOS  --  53  BILITOT  --  0.6  PROT  --  5.7*  ALBUMIN 3.5 3.1*    Recent Labs Lab 06/06/16 0231  AMMONIA 25    Coagulation Profile: No results for input(s): INR, PROTIME in the last 168 hours.  Cardiac Enzymes:  Recent Labs Lab 06/01/16 1552  CKTOTAL 24*    HbA1C: Hemoglobin A1C    Date/Time Value Ref Range Status  07/17/2014 04:17 AM 13.8 (H) % Final    Comment:    4.0-6.0 NOTE: New Reference Range  06/07/14   07/20/2013 07:05 AM 9.1 (H) 4.2 - 6.3 % Final    Comment:    The American Diabetes Association recommends that a primary goal of therapy should be <7% and that physicians should reevaluate the treatment regimen in patients with HbA1c values consistently >8%.    Hgb A1c MFr Bld  Date/Time Value Ref Range Status  09/13/2015 02:27 AM 6.2 (H) 4.0 - 6.0 % Final  07/18/2015 04:55 AM  8.6 (H) 4.0 - 6.0 % Final    CBG:  Recent Labs Lab 06/07/16 0746 06/07/16 1642 06/07/16 2114 06/08/16 0637 06/08/16 0640  GLUCAP 126* 137* 149* 215* 119*    Recent Results (from the past 240 hour(s))  Anaerobic culture     Status: None   Collection Time: 05/31/16  1:27 PM  Result Value Ref Range Status   Specimen Description CSF  Final   Special Requests NONE  Final   Culture NO ANAEROBES ISOLATED  Final   Report Status 06/05/2016 FINAL  Final  CSF culture     Status: None   Collection Time: 05/31/16  1:27 PM  Result Value Ref Range Status   Specimen Description CSF  Final   Special Requests NONE  Final   Gram Stain   Final    WBC PRESENT, PREDOMINANTLY MONONUCLEAR NO ORGANISMS SEEN CYTOSPIN SMEAR    Culture NO GROWTH 3 DAYS  Final   Report Status 06/03/2016 FINAL  Final  Culture, blood (Routine X 2) w Reflex to ID Panel     Status: None   Collection Time: 05/31/16  5:00 PM  Result Value Ref Range Status   Specimen Description BLOOD RIGHT WRIST  Final   Special Requests IN PEDIATRIC BOTTLE 2CC  Final   Culture NO GROWTH 5 DAYS  Final   Report Status 06/05/2016 FINAL  Final  Culture, blood (Routine X 2) w Reflex to ID Panel     Status: Abnormal   Collection Time: 05/31/16  5:03 PM  Result Value Ref Range Status   Specimen Description BLOOD RIGHT HAND  Final   Special Requests IN PEDIATRIC BOTTLE 2CC  Final   Culture  Setup Time   Final    GRAM  POSITIVE COCCI IN CLUSTERS AEROBIC BOTTLE ONLY CRITICAL RESULT CALLED TO, READ BACK BY AND VERIFIED WITH: M SHUDA 06/02/16 @ 0840 M VESTAL    Culture (A)  Final    STAPHYLOCOCCUS SPECIES (COAGULASE NEGATIVE) THE SIGNIFICANCE OF ISOLATING THIS ORGANISM FROM A SINGLE SET OF BLOOD CULTURES WHEN MULTIPLE SETS ARE DRAWN IS UNCERTAIN. PLEASE NOTIFY THE MICROBIOLOGY DEPARTMENT WITHIN ONE WEEK IF SPECIATION AND SENSITIVITIES ARE REQUIRED.    Report Status 06/04/2016 FINAL  Final  Blood Culture ID Panel (Reflexed)     Status: Abnormal   Collection Time: 05/31/16  5:03 PM  Result Value Ref Range Status   Enterococcus species NOT DETECTED NOT DETECTED Final   Listeria monocytogenes NOT DETECTED NOT DETECTED Final   Staphylococcus species DETECTED (A) NOT DETECTED Final    Comment: Methicillin (oxacillin) resistant coagulase negative staphylococcus. Possible blood culture contaminant (unless isolated from more than one blood culture draw or clinical case suggests pathogenicity). No antibiotic treatment is indicated for blood  culture contaminants. CRITICAL RESULT CALLED TO, READ BACK BY AND VERIFIED WITH: M SHUDA 06/02/16 @ 0840 M VESTAL    Staphylococcus aureus NOT DETECTED NOT DETECTED Final   Methicillin resistance DETECTED (A) NOT DETECTED Final    Comment: CRITICAL RESULT CALLED TO, READ BACK BY AND VERIFIED WITH: M SHUDA 06/02/16 @ 0840 M VESTAL    Streptococcus species NOT DETECTED NOT DETECTED Final   Streptococcus agalactiae NOT DETECTED NOT DETECTED Final   Streptococcus pneumoniae NOT DETECTED NOT DETECTED Final   Streptococcus pyogenes NOT DETECTED NOT DETECTED Final   Acinetobacter baumannii NOT DETECTED NOT DETECTED Final   Enterobacteriaceae species NOT DETECTED NOT DETECTED Final   Enterobacter cloacae complex NOT DETECTED NOT DETECTED Final   Escherichia coli NOT DETECTED  NOT DETECTED Final   Klebsiella oxytoca NOT DETECTED NOT DETECTED Final   Klebsiella pneumoniae NOT DETECTED NOT  DETECTED Final   Proteus species NOT DETECTED NOT DETECTED Final   Serratia marcescens NOT DETECTED NOT DETECTED Final   Haemophilus influenzae NOT DETECTED NOT DETECTED Final   Neisseria meningitidis NOT DETECTED NOT DETECTED Final   Pseudomonas aeruginosa NOT DETECTED NOT DETECTED Final   Candida albicans NOT DETECTED NOT DETECTED Final   Candida glabrata NOT DETECTED NOT DETECTED Final   Candida krusei NOT DETECTED NOT DETECTED Final   Candida parapsilosis NOT DETECTED NOT DETECTED Final   Candida tropicalis NOT DETECTED NOT DETECTED Final  MRSA PCR Screening     Status: None   Collection Time: 05/31/16  7:29 PM  Result Value Ref Range Status   MRSA by PCR NEGATIVE NEGATIVE Final    Comment:        The GeneXpert MRSA Assay (FDA approved for NASAL specimens only), is one component of a comprehensive MRSA colonization surveillance program. It is not intended to diagnose MRSA infection nor to guide or monitor treatment for MRSA infections.   Culture, respiratory (NON-Expectorated)     Status: None   Collection Time: 05/31/16 11:12 PM  Result Value Ref Range Status   Specimen Description TRACHEAL ASPIRATE  Final   Special Requests NONE  Final   Gram Stain   Final    RARE WBC PRESENT, PREDOMINANTLY PMN RARE SQUAMOUS EPITHELIAL CELLS PRESENT RARE GRAM POSITIVE COCCOBACILLUS    Culture Consistent with normal respiratory flora.  Final   Report Status 06/03/2016 FINAL  Final  Culture, blood (Routine X 2) w Reflex to ID Panel     Status: None   Collection Time: 06/03/16  9:45 AM  Result Value Ref Range Status   Specimen Description BLOOD RIGHT HAND  Final   Special Requests IN PEDIATRIC BOTTLE 2CC  Final   Culture NO GROWTH 5 DAYS  Final   Report Status 06/08/2016 FINAL  Final  Culture, blood (Routine X 2) w Reflex to ID Panel     Status: None   Collection Time: 06/03/16 10:20 AM  Result Value Ref Range Status   Specimen Description BLOOD LEFT HAND  Final   Special Requests  BOTTLES DRAWN AEROBIC AND ANAEROBIC 5CC  Final   Culture NO GROWTH 5 DAYS  Final   Report Status 06/08/2016 FINAL  Final     Scheduled Meds: . amLODipine  10 mg Oral Daily  . Chlorhexidine Gluconate Cloth  6 each Topical q morning - 10a  . clopidogrel  75 mg Oral Daily  . clotrimazole  1 Applicatorful Vaginal QHS  . darbepoetin (ARANESP) injection - DIALYSIS  60 mcg Intravenous Q Thu-HD  . feeding supplement (NEPRO CARB STEADY)  237 mL Oral BID WC  . heparin  4,000 Units Dialysis Once in dialysis  . heparin  4,000 Units Dialysis Once in dialysis  . heparin  5,000 Units Subcutaneous Q8H  . hydrALAZINE  50 mg Oral Daily  . insulin aspart  0-5 Units Subcutaneous QHS  . insulin aspart  0-9 Units Subcutaneous TID WC  . isosorbide mononitrate  90 mg Oral Daily  . lactulose  10 g Oral Daily  . levETIRAcetam  1,000 mg Oral Daily  . levETIRAcetam  500 mg Oral Q T,Th,Sat-1800  . lisinopril  10 mg Oral Daily  . metoprolol  100 mg Oral BID  . pantoprazole  40 mg Oral BID AC  . polyethylene glycol  17 g Oral  Daily    LOS: 8 days   Signature  Susa Raring K M.D on 06/08/2016 at 9:47 AM  Between 7am to 7pm - Pager - [phone], After 7pm go to [email or web address] - password [email or web address]  [facility]  - Office  [phone]

## 2016-06-08 NOTE — Progress Notes (Signed)
Pt and dtr refused risperadone. Dtr states that she was told that it caused her seizures. Barbera Setters  RN

## 2016-06-09 ENCOUNTER — Encounter (HOSPITAL_COMMUNITY): Payer: Self-pay | Admitting: Internal Medicine

## 2016-06-09 LAB — GLUCOSE, CAPILLARY
GLUCOSE-CAPILLARY: 149 mg/dL — AB (ref 65–99)
Glucose-Capillary: 135 mg/dL — ABNORMAL HIGH (ref 65–99)
Glucose-Capillary: 153 mg/dL — ABNORMAL HIGH (ref 65–99)
Glucose-Capillary: 93 mg/dL (ref 65–99)

## 2016-06-09 MED ORDER — ALPRAZOLAM 0.5 MG PO TABS
0.5000 mg | ORAL_TABLET | Freq: Once | ORAL | Status: AC
  Administered 2016-06-09: 0.5 mg via ORAL
  Filled 2016-06-09: qty 1

## 2016-06-09 MED ORDER — HYDRALAZINE HCL 50 MG PO TABS: 50.0000 mg | ORAL_TABLET | Freq: Three times a day (TID) | ORAL | Status: DC

## 2016-06-09 MED ORDER — OXYCODONE-ACETAMINOPHEN 5-325 MG PO TABS: 1.0000 | ORAL_TABLET | Freq: Two times a day (BID) | ORAL | 0 refills | Status: DC | PRN

## 2016-06-09 MED ORDER — LEVETIRACETAM 500 MG PO TABS: 1500.0000 mg | ORAL_TABLET | Freq: Every morning | ORAL | 0 refills | Status: DC

## 2016-06-09 NOTE — Discharge Summary (Signed)
Kimberly Montgomery GEX:[id number] DOB: Jul 14, 1965 DOA: 05/31/2016  PCP: Jarome Matin, MD  Admit date: 05/31/2016  Discharge date: 06/09/2016  Admitted From: SNF  Disposition:  SNF   Recommendations for Outpatient Follow-up:   Follow up with PCP in 1-2 weeks  PCP Please obtain BMP/CBC, 2 view CXR in 1week,  (see Discharge instructions)   PCP Please follow up on the following pending results: Monitor blood culture results drawn on 06/08/2016.   Home Health: None   Equipment/Devices: None  Consultations: Renal, neurology Discharge Condition: Fair  CODE STATUS: Full   Diet Recommendation: DIET DYS 3     Chief Complaint  Patient presents with  . Code Stroke     Brief history of present illness from the day of admission and additional interim summary     51 yo F who presented with altered mental status and seizure with HTN emergency (SBP 240). Intubated for airway protection. Had fever. She has hx of seizures, ESRD on HD, DM, CHF.                                                                 Hospital Course    Seizures Along with EPS   - LP unremarkable - CT/MRI head negative - Neuro following and directing medical tx, she is currently on Keppra and stable, her tremors were thought to be due to EPS caused by Reglan and risperidone both held. Now close to baseline will be discharged back to SNF on high-dose Keppra, request SNF to arrange for outpatient neurology follow-up within a week along with her primary psychiatrist within a week.  ESRD - HD per per nephrology, has left subclavian HD catheter, TTS schedule  AOCD - Hgb stable   Dysphagia - On modified diet dysphagia 3 diet, continue to monitor swallowing at SNF, recommended continued speech follow-up, aspiration precautions.    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 Luciana Axe. Stopped all antibiotics and monitor clinically and repeat blood cultures on 06/09/2016, if blood cultures remain negative and she mounts no fever in the next 1-2 weeks then the finding on echocardiogram is likely old blood clot.  CAD w/ hx MI 2017 - On chronic ASA + Plavix  HTN -  BP variable - on Norvasc, ACE, doubled hydralazine, Continue to monitor blood pressure at SNF.  Gastroparesis - resume Reglan before every meal  Bipolar D/O - Off Wellbutrin for now given seizure activity   Chronic headaches - On chronic percocet, have tapered the dose, gradually taper off narcotics for headaches.  Deconditioning - PT/OT assessment SNF placement.  DM 2 - CBG reasonably well controlled - Tinea home regimen check CBGs before every meal at bedtime  at SNF.    Discharge diagnosis     Active Problems:   Seizures (HCC)   Seizure (HCC)   Acute encephalopathy   Tremor   Cerebrovascular disease    Discharge instructions    Discharge Instructions    Discharge instructions    Complete by:  As directed    Follow with Primary MD DALE, PATRICK D, MD and your psychiatrist within 7 days   Get CBC, CMP, 2 view Chest X ray checked  by Primary MD or SNF MD in 5-7 days ( we routinely change or add medications that can affect your baseline labs and fluid status, therefore we recommend that you get the mentioned basic workup next visit with your PCP, your PCP may decide not to get them or add new tests based on their clinical decision)  Activity: As tolerated with Full fall precautions use walker/cane & assistance as needed  Disposition SNF  Diet:   DIET DYS 3 with feeding assistance and aspiration precautions.  For Heart failure patients - Check your Weight same time everyday, if you gain over 2 pounds, or you develop in leg swelling, experience more shortness  of breath or chest pain, call your Primary MD immediately. Follow Cardiac Low Salt Diet and 1.5 lit/day fluid restriction.  On your next visit with your primary care physician please Get Medicines reviewed and adjusted.  Please request your Prim.MD to go over all Hospital Tests and Procedure/Radiological results at the follow up, please get all Hospital records sent to your Prim MD by signing hospital release before you go home.  If you experience worsening of your admission symptoms, develop shortness of breath, life threatening emergency, suicidal or homicidal thoughts you must seek medical attention immediately by calling 911 or calling your MD immediately  if symptoms less severe.  You Must read complete instructions/literature along with all the possible adverse reactions/side effects for all the Medicines you take and that have been prescribed to you. Take any new Medicines after you have completely understood and accpet all the possible adverse reactions/side effects.   Do not drive, operate heavy machinery, perform activities at heights, swimming or participation in water activities or provide baby sitting services if your were admitted for syncope or siezures until you have seen by Primary MD or a Neurologist and advised to do so again.  Do not drive when taking Pain medications.    Do not take more than prescribed Pain, Sleep and Anxiety Medications  Special Instructions: If you have smoked or chewed Tobacco  in the last 2 yrs please stop smoking, stop any regular Alcohol  and or any Recreational drug use.  Wear Seat belts while driving.   Please note  You were cared for by a hospitalist during your hospital stay. If you have any questions about your discharge medications or the care you received while you were in the hospital after you are discharged, you can call the unit and asked to speak with the hospitalist on call if the hospitalist that took care of you is not available.  Once you are discharged, your primary care physician will handle any further medical issues. Please note that NO REFILLS for any discharge medications will be authorized once you are discharged, as it is imperative that you return to your primary care physician (or establish a relationship with a primary care physician if you do not have one) for your aftercare needs so that they can reassess your need for medications and monitor your lab values.  Increase activity slowly    Complete by:  As directed       Discharge Medications   Allergies as of 06/09/2016      Reactions   Cephalosporins Anaphylaxis   Patient has tolerated meropenem.    Penicillins Anaphylaxis, Other (See Comments)   Has patient had a PCN reaction causing immediate rash, facial/tongue/throat swelling, SOB or lightheadedness with hypotension: Yes Has patient had a PCN reaction causing severe rash involving mucus membranes or skin necrosis: No Has patient had a PCN reaction that required hospitalization No Has patient had a PCN reaction occurring within the last 10 years: No If all of the above answers are "NO", then may proceed with Cephalosporin use.   Reglan [metoclopramide] Other (See Comments)   Severe EPS (lip, head, body tremors) March 2018   Risperdal [risperidone] Other (See Comments)   Severe EPS (lip, head, body tremors) March 2018   Lamictal [lamotrigine] Other (See Comments)   Reaction:  Hallucinations   Phenergan [promethazine Hcl] Nausea And Vomiting   Pravastatin Other (See Comments)   Reaction:  Muscle pain    Sulfa Antibiotics Other (See Comments)   Reaction:  Unknown       Medication List    STOP taking these medications   metoCLOPramide 10 MG tablet Commonly known as:  REGLAN   risperiDONE 1 MG tablet Commonly known as:  RISPERDAL   trihexyphenidyl 2 MG tablet Commonly known as:  ARTANE     TAKE these medications   amLODipine 10 MG tablet Commonly known as:  NORVASC Take 10 mg by mouth  daily.   buPROPion 200 MG 12 hr tablet Commonly known as:  WELLBUTRIN SR Take 200 mg by mouth daily.   calcium carbonate 500 MG chewable tablet Commonly known as:  TUMS - dosed in mg elemental calcium Chew 1 tablet (200 mg of elemental calcium total) by mouth 3 (three) times daily with meals.   clopidogrel 75 MG tablet Commonly known as:  PLAVIX Take 75 mg by mouth daily.   feeding supplement (PRO-STAT SUGAR FREE 64) Liqd Take 30 mLs by mouth 3 (three) times daily with meals.   fluticasone 50 MCG/ACT nasal spray Commonly known as:  FLONASE Place 1 spray into both nostrils daily.   gabapentin 300 MG capsule Commonly known as:  NEURONTIN Take 300 mg by mouth 3 (three) times daily.   hydrALAZINE 50 MG tablet Commonly known as:  APRESOLINE Take 1 tablet (50 mg total) by mouth 3 (three) times daily. What changed:  medication strength  how much to take  when to take this   insulin aspart 100 UNIT/ML injection Commonly known as:  novoLOG Inject 10 Units into the skin 3 (three) times daily with meals.   insulin glargine 100 UNIT/ML injection Commonly known as:  LANTUS Inject 0.08 mLs (8 Units total) into the skin at bedtime.   isosorbide mononitrate 60 MG 24 hr tablet Commonly known as:  IMDUR Take 1.5 tablets (90 mg total) by mouth daily.   lactulose 10 GM/15ML solution Commonly known as:  CHRONULAC Take 10 g by mouth daily.   levETIRAcetam 500 MG tablet Commonly known as:  KEPPRA Take 3 tablets (1,500 mg total) by mouth every morning. What changed:  how much to take   Lidocaine-Menthol 4-1 % Ptch Apply 1 patch topically at bedtime. Apply lumbar region. Remove after 12 hours.   lisinopril 10 MG tablet Commonly known as:  PRINIVIL,ZESTRIL Take 1 tablet (10 mg total) by mouth daily.   metoprolol  50 MG tablet Commonly known as:  LOPRESSOR Take 1 tablet (50 mg total) by mouth 2 (two) times daily.   multivitamin Tabs tablet Take 1 tablet by mouth daily.     mupirocin cream 2 % Commonly known as:  BACTROBAN Apply 1 application topically 2 (two) times daily. Apply into nose.   nitroGLYCERIN 0.4 MG SL tablet Commonly known as:  NITROSTAT Place 0.4 mg under the tongue every 5 (five) minutes as needed for chest pain. Reported on 07/10/2015   omeprazole 20 MG capsule Commonly known as:  PRILOSEC Take 20 mg by mouth 2 (two) times daily before a meal.   ondansetron 4 MG disintegrating tablet Commonly known as:  ZOFRAN ODT Take 1 tablet (4 mg total) by mouth every 6 (six) hours as needed for nausea or vomiting.   oxyCODONE-acetaminophen 5-325 MG tablet Commonly known as:  PERCOCET/ROXICET Take 1 tablet by mouth every 12 (twelve) hours as needed for severe pain. Do not exceed 4gm of Tylenol in 24 hours What changed:  how much to take  how to take this  when to take this  reasons to take this  additional instructions   polyethylene glycol packet Commonly known as:  MIRALAX / GLYCOLAX Take 17 g by mouth daily.   REFRESH 1 % ophthalmic solution Generic drug:  carboxymethylcellulose Place 1 drop into both eyes 3 (three) times daily.   senna 8.6 MG Tabs tablet Commonly known as:  SENOKOT Take 2 tablets by mouth 2 (two) times daily.   sevelamer carbonate 800 MG tablet Commonly known as:  RENVELA Take 800 mg by mouth See admin instructions. Take 1 tablet four times a day. (with each meal and with snack.)   torsemide 100 MG tablet Commonly known as:  DEMADEX Take 100 mg by mouth daily.       Follow-up Information    DALE, PATRICK D, MD. Schedule an appointment as soon as possible for a visit in 1 week(s).   Specialty:  Internal Medicine Why:  and your psychiatrist within 1 week Contact information: [address] [place] [address] [phone]        [facility]. Schedule an appointment as soon as possible for a visit in 1 week(s).   Why:  seizuers and EPS   Contact information: [address]     [address] [place] [address] [phone]          Major procedures and Radiology Reports - PLEASE review detailed and final reports thoroughly  -      Significant Events: 3/2 admit - LP (unrevealing) 3/5 TTE EF 55-60% - ? Mitral vegetation  3/7 TRH assumed care  3/9 TEE - There was no evidence of an acute valvular vegetation. Mitralannulur calcification with possibly a calcified thrombus or oldvegetation which is calcified on the mitral annulus.  Ct Angio Head W Or Wo Contrast  Result Date: 05/31/2016 CLINICAL DATA:  51 year old female code stroke. Right side weakness. Initial encounter. EXAM: CT ANGIOGRAPHY HEAD AND NECK TECHNIQUE: Multidetector CT imaging of the head and neck was performed using the standard protocol during bolus administration of intravenous contrast. Multiplanar CT image reconstructions and MIPs were obtained to evaluate the vascular anatomy. Carotid stenosis measurements (when applicable) are obtained utilizing NASCET criteria, using the distal internal carotid diameter as the denominator. CONTRAST:  50 mL Isovue 370 COMPARISON:  Noncontrast head CT 1102 hours today. FINDINGS: CTA NECK Skeleton: No acute osseous abnormality identified. Mild for age spine degeneration. Upper chest:  Negative lung apices. No superior mediastinal lymphadenopathy. There is a left internal jugular approach dual lumen dialysis type catheter in place. The visible SVC is patent. Other neck: Subcentimeter left thyroid lobe and isthmus nodules which do not meet size criteria for ultrasound follow-up. Negative larynx, pharynx, parapharyngeal spaces, retropharyngeal space, sublingual space, submandibular glands and parotid glands. No lymphadenopathy. Aortic arch: 3 vessel arch configuration. Mild arch and proximal great vessels soft and calcified atherosclerotic plaque. Right carotid system: No brachiocephalic artery or right CCA origin stenosis despite some plaque. Mild  circumferential soft plaque in the right CCA. Mostly calcified plaque at the right carotid bifurcation which is mild and results in less than 50 % stenosis with respect to the distal vessel. Somewhat non dominant appearing right ICA. Left carotid system: No left CCA origin stenosis despite soft and calcified plaque. Minimal soft and calcified plaque elsewhere in the left CCA. At the left carotid bifurcation there is combination soft and calcified plaque resulting in a short segment of high-grade stenosis estimated at 70 % stenosis with respect to the distal vessel (series 402, images 124n 125). Calcified plaque continues into the left ICA bulb but there is no additional cervical left ICA stenosis. Vertebral arteries: Calcified and soft plaque at the right subclavian artery origin with no hemodynamically significant stenosis. Normal right vertebral artery origin. Normal cervical right vertebral artery. No proximal left subclavian artery stenosis despite soft and calcified plaque. There is soft plaque at the left vertebral artery origin with mild or at most moderate stenosis (series 403, image 138). Mild left V2 segment calcified plaque without stenosis. The left vertebral artery is mildly non dominant. CTA HEAD Posterior circulation: Right V4 segment calcified plaque with mild distal right vertebral artery stenosis. Patent vertebrobasilar junction. Combined soft and calcified left V4 segment plaque with similar mild stenosis. Normal left vertebrobasilar junction. Both a ICAs appear dominant. No basilar stenosis. Normal SCA and PCA origins. Diminutive posterior communicating arteries. Bilateral PCA branches are within normal limits. Anterior circulation: Both ICA siphons are patent. There is moderate to severe bilateral siphon calcified plaque worse on the right. On the left side there is moderate cavernous left ICA stenosis (series 404, images 129 in 04/1928. On the right there is moderate to severe stenosis at both  the proximal and distal cavernous segments (series 402, image 239). Normal ophthalmic artery origins. Carotid termini are patent. The left A1 segment is dominant and the right A1 is diminutive or absent. Anterior communicating artery and bilateral ACA branches are within normal limits. Right MCA M1 segment, right MCA bifurcation, and right MCA branches are within normal limits. The left MCA M1 segment is patent and bifurcates early. There is a patent left anterior temporal artery branch on series 404, image 133. The left MCA bifurcation then is patent. No left MCA branch occlusion or significant stenosis is identified. Venous sinuses: Patent. Anatomic variants: Mildly dominant right vertebral artery. Mildly dominant left ICA, with dominant left and diminutive or absent right ACA A1 segments. Review of the MIP images confirms the above findings IMPRESSION: 1. Negative for emergent large vessel occlusion. This was preliminarily discussed with Dr. Ritta Slot via telephone at 1141 hours. 2. 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 3. There is also extensive ICA siphon calcified plaque with moderate left and moderate to severe right ICA siphon stenosis. 4. Mildly dominant right vertebral artery. Mild bilateral V4 segment vertebral artery stenosis, with mild to moderate stenosis at the origin  of the left vertebral artery. 5. Left IJ approach dual lumen dialysis type catheter in place. Electronically Signed   By: Odessa Fleming M.D.   On: 05/31/2016 11:56   Dg Chest 2 View  Result Date: 05/18/2016 CLINICAL DATA:  Shortness of breath and chest pain. Chronic renal failure EXAM: CHEST  2 VIEW COMPARISON:  March 13, 2016 FINDINGS: There is focal consolidation in the posterior left base. Lungs elsewhere clear. Heart is borderline enlarged with pulmonary vascularity within normal limits. There is atherosclerotic calcification in the aorta. Central catheter  tip is in the cavoatrial junction. No pneumothorax. No bone lesions. IMPRESSION: Posterior left base airspace consolidation consistent with pneumonia. Lungs elsewhere clear. No pneumothorax. Heart borderline enlarged with aortic atherosclerosis. Followup PA and lateral chest radiographs recommended in 3-4 weeks following trial of antibiotic therapy to ensure resolution and exclude underlying malignancy. Electronically Signed   By: Bretta Bang III M.D.   On: 05/18/2016 08:47   Ct Head Wo Contrast  Result Date: 05/31/2016 CLINICAL DATA:  51 year old female admitted with altered mental status, initially thought to be code stroke. Subsequent positive blood cultures. Status post fluoroscopic guided lumbar puncture at 1255 hours today. Acutely decompensated with hypertension and bradycardia. Now intubated. Initial encounter. EXAM: CT HEAD WITHOUT CONTRAST TECHNIQUE: Contiguous axial images were obtained from the base of the skull through the vertex without intravenous contrast. COMPARISON:  Cta head and neck 1123 hours today and head CT without contrast 1102 hours today. FINDINGS: Brain: No acute intracranial hemorrhage identified. No midline shift, mass effect, or evidence of intracranial mass lesion. No ventriculomegaly. Stable gray-white matter differentiation throughout the brain. No cortically based acute infarct identified. Vascular: Calcified atherosclerosis at the skull base. Skull: No acute osseous abnormality identified. Sinuses/Orbits: New left maxillary sinus fluid level and mucosal thickening. The nasal cavity and nasopharynx now all are opacified. Heterogeneous gas at the anterior nasal septum appears to occupy an area of chronic septal erosion. Other Visualized paranasal sinuses and mastoids are stable and well pneumatized. Other: No scalp hematoma. Orbits soft tissues appear stable. Aside from the pharynx opacification negative visualized noncontrast deep soft tissue spaces of the face.  IMPRESSION: 1. Stable CT appearance of the brain since 1102 hours today. No acute intracranial abnormality. 2. Fluid now throughout the nasal cavity and visible pharynx in the setting of intubation. Electronically Signed   By: Odessa Fleming M.D.   On: 05/31/2016 16:37   Ct Head Wo Contrast  Result Date: 05/31/2016 CLINICAL DATA:  51 year old female with right side weakness. Code stroke. Initial encounter. EXAM: CT HEAD WITHOUT CONTRAST TECHNIQUE: Contiguous axial images were obtained from the base of the skull through the vertex without intravenous contrast. COMPARISON:  Head CT 02/25/2016 FINDINGS: Brain: Gray-white matter differentiation appears stable and within normal limits throughout the brain. No cortically based acute infarct identified. No acute intracranial hemorrhage identified. No midline shift, mass effect, or evidence of intracranial mass lesion. No ventriculomegaly. Vascular: Calcified atherosclerosis at the skull base. No suspicious intracranial vascular hyperdensity. Skull: No acute osseous abnormality identified. Sinuses/Orbits: Mild mucosal thickening in the posterior left maxillary sinus. Other Visualized paranasal sinuses and mastoids are stable and well pneumatized. Other: Orbit and scalp soft tissues appear stable and negative. ASPECTS score = 10 [place] Stroke Program Early CT Score Normal score = 10 IMPRESSION: 1. Stable and normal Normal noncontrast CT appearance of the brain. 2. ASPECTS 10. 3. The above was relayed via text pager to Dr. Ritta Slot on 05/31/2016 at 11:09 . Electronically  Signed   By: Odessa Fleming M.D.   On: 05/31/2016 11:09   Ct Angio Neck W Or Wo Contrast  Result Date: 05/31/2016 CLINICAL DATA:  51 year old female code stroke. Right side weakness. Initial encounter. EXAM: CT ANGIOGRAPHY HEAD AND NECK TECHNIQUE: Multidetector CT imaging of the head and neck was performed using the standard protocol during bolus administration of intravenous contrast. Multiplanar CT image  reconstructions and MIPs were obtained to evaluate the vascular anatomy. Carotid stenosis measurements (when applicable) are obtained utilizing NASCET criteria, using the distal internal carotid diameter as the denominator. CONTRAST:  50 mL Isovue 370 COMPARISON:  Noncontrast head CT 1102 hours today. FINDINGS: CTA NECK Skeleton: No acute osseous abnormality identified. Mild for age spine degeneration. Upper chest: Negative lung apices. No superior mediastinal lymphadenopathy. There is a left internal jugular approach dual lumen dialysis type catheter in place. The visible SVC is patent. Other neck: Subcentimeter left thyroid lobe and isthmus nodules which do not meet size criteria for ultrasound follow-up. Negative larynx, pharynx, parapharyngeal spaces, retropharyngeal space, sublingual space, submandibular glands and parotid glands. No lymphadenopathy. Aortic arch: 3 vessel arch configuration. Mild arch and proximal great vessels soft and calcified atherosclerotic plaque. Right carotid system: No brachiocephalic artery or right CCA origin stenosis despite some plaque. Mild circumferential soft plaque in the right CCA. Mostly calcified plaque at the right carotid bifurcation which is mild and results in less than 50 % stenosis with respect to the distal vessel. Somewhat non dominant appearing right ICA. Left carotid system: No left CCA origin stenosis despite soft and calcified plaque. Minimal soft and calcified plaque elsewhere in the left CCA. At the left carotid bifurcation there is combination soft and calcified plaque resulting in a short segment of high-grade stenosis estimated at 70 % stenosis with respect to the distal vessel (series 402, images 124n 125). Calcified plaque continues into the left ICA bulb but there is no additional cervical left ICA stenosis. Vertebral arteries: Calcified and soft plaque at the right subclavian artery origin with no hemodynamically significant stenosis. Normal right  vertebral artery origin. Normal cervical right vertebral artery. No proximal left subclavian artery stenosis despite soft and calcified plaque. There is soft plaque at the left vertebral artery origin with mild or at most moderate stenosis (series 403, image 138). Mild left V2 segment calcified plaque without stenosis. The left vertebral artery is mildly non dominant. CTA HEAD Posterior circulation: Right V4 segment calcified plaque with mild distal right vertebral artery stenosis. Patent vertebrobasilar junction. Combined soft and calcified left V4 segment plaque with similar mild stenosis. Normal left vertebrobasilar junction. Both a ICAs appear dominant. No basilar stenosis. Normal SCA and PCA origins. Diminutive posterior communicating arteries. Bilateral PCA branches are within normal limits. Anterior circulation: Both ICA siphons are patent. There is moderate to severe bilateral siphon calcified plaque worse on the right. On the left side there is moderate cavernous left ICA stenosis (series 404, images 129 in 04/1928. On the right there is moderate to severe stenosis at both the proximal and distal cavernous segments (series 402, image 239). Normal ophthalmic artery origins. Carotid termini are patent. The left A1 segment is dominant and the right A1 is diminutive or absent. Anterior communicating artery and bilateral ACA branches are within normal limits. Right MCA M1 segment, right MCA bifurcation, and right MCA branches are within normal limits. The left MCA M1 segment is patent and bifurcates early. There is a patent left anterior temporal artery branch on series 404, image 133. The  left MCA bifurcation then is patent. No left MCA branch occlusion or significant stenosis is identified. Venous sinuses: Patent. Anatomic variants: Mildly dominant right vertebral artery. Mildly dominant left ICA, with dominant left and diminutive or absent right ACA A1 segments. Review of the MIP images confirms the above  findings IMPRESSION: 1. Negative for emergent large vessel occlusion. This was preliminarily discussed with Dr. Ritta Slot via telephone at 1141 hours. 2. 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 3. There is also extensive ICA siphon calcified plaque with moderate left and moderate to severe right ICA siphon stenosis. 4. Mildly dominant right vertebral artery. Mild bilateral V4 segment vertebral artery stenosis, with mild to moderate stenosis at the origin of the left vertebral artery. 5. Left IJ approach dual lumen dialysis type catheter in place. Electronically Signed   By: Odessa Fleming M.D.   On: 05/31/2016 11:56   Mr Brain Wo Contrast  Result Date: 05/31/2016 CLINICAL DATA:  Acute onset of altered mental status. Seizure-like activity. EXAM: MRI HEAD WITHOUT CONTRAST TECHNIQUE: Multiplanar, multiecho pulse sequences of the brain and surrounding structures were obtained without intravenous contrast. COMPARISON:  CT head without contrast 05/31/2016 FINDINGS: Brain: The diffusion-weighted images demonstrate no evidence for acute or subacute infarction. Mild age advanced atrophy and white matter changes are present bilaterally. T2 changes are present in the brainstem. A remote infarct is evident within the left middle cerebellar peduncle. Additional remote lacunar infarcts are present in the left thalamus. A remote punctate lacunar infarct is present in the inferior right cerebellum. Dedicated imaging of the temporal lobes demonstrate symmetric size and signal of the hippocampal structures. Vascular: Flow is present in the major intracranial arteries. Skull and upper cervical spine: The patient is intubated. Skullbase is otherwise within normal limits. Fluid is present in the nasopharynx. Craniocervical junction is normal. Midline sagittal structures are unremarkable. Sinuses/Orbits: A fluid level is present in the left maxillary sinus. There is  diffuse mucosal thickening in the ethmoid air cells, sphenoid sinuses, and right maxillary sinus. The frontal sinuses are clear bilaterally. Minimal mastoid fluid is present bilaterally. IMPRESSION: 1. No acute intracranial abnormality to explain 7 not altered mental status. 2. Remote lacunar infarcts within the posterior circulation as described. 3. Mild generalized atrophy and white matter disease is advanced for age. Electronically Signed   By: Marin Roberts M.D.   On: 05/31/2016 20:29   Dg Chest Port 1 View  Result Date: 06/03/2016 CLINICAL DATA:  Acute respiratory failure, shortness of breath, history of coronary artery disease and CHF, previous MI, diabetes, former smoker. EXAM: PORTABLE CHEST 1 VIEW COMPARISON:  Portable chest x-ray of June 01, 2016 FINDINGS: The lungs are adequately inflated. The retrocardiac density on the left is mildly increase today. There is partial obscuration of the hemidiaphragm. The right lung is clear. The heart is normal in size. The pulmonary vascularity is not engorged. There is calcification in the wall of the aortic arch. The dual-lumen dialysis catheter tip projects over the cavoatrial junction. The endotracheal tube tip lies 6.1 cm above the carina. The esophagogastric tube tip projects below the inferior margin of the image. IMPRESSION: Mildly increased left basilar density suggests subsegmental atelectasis. A trace left pleural effusion may be present. There is no pulmonary edema. The support tubes are in stable position. Thoracic aortic atherosclerosis. Electronically Signed   By: David  [place] M.D.   On: 06/03/2016 07:10   Dg Chest Port 1 View  Result Date: 06/02/2016 CLINICAL  DATA:  Acute respiratory failure EXAM: PORTABLE CHEST 1 VIEW COMPARISON:  06/01/2016 FINDINGS: Endotracheal tube is in place with tip approximately 4.5 cm above the carina. Nasogastric tube is in place, tip beyond the edge of the image beyond the gastroesophageal junction. Left dual  lumen catheter tip overlies the level of the superior vena cava-right atrium. The heart is enlarged. Possible mild infiltrate or atelectasis in the medial left lung base. No pulmonary edema. IMPRESSION: 1. Lines and tubes appear unchanged. 2. Left lower lobe atelectasis or early infiltrate. Electronically Signed   By: Norva Pavlov M.D.   On: 06/02/2016 07:57   Dg Chest Port 1 View  Result Date: 06/01/2016 CLINICAL DATA:  Respiratory failure. EXAM: PORTABLE CHEST 1 VIEW COMPARISON:  Radiographs of May 31, 2016. FINDINGS: Endotracheal and nasogastric tubes are unchanged in position. Left internal jugular dialysis catheter is unchanged. No pneumothorax or pleural effusion is noted. Mild bibasilar subsegmental atelectasis is noted. Bony thorax is unremarkable. IMPRESSION: Mild bibasilar subsegmental atelectasis.  Stable support apparatus. Electronically Signed   By: Lupita Raider, M.D.   On: 06/01/2016 08:29   Dg Chest Portable 1 View  Result Date: 05/31/2016 CLINICAL DATA:  Post intubation EXAM: PORTABLE CHEST 1 VIEW COMPARISON:  05/18/2016 FINDINGS: Cardiomegaly again noted. Double lumen left IJ catheter is unchanged in position. There is endotracheal tube in place with tip 2.7 cm above the carina. NG tube in place. The tip of the NG tube is not included on the film. Central mild vascular congestion without convincing pulmonary edema. Mild right basilar atelectasis. No segmental infiltrate. IMPRESSION: Double lumen left IJ catheter is unchanged in position. There is endotracheal tube in place with tip 2.7 cm above the carina. NG tube in place. The tip of the NG tube is not included on the film. Central mild vascular congestion without convincing pulmonary edema. Mild right basilar atelectasis. No segmental infiltrate. Electronically Signed   By: Natasha Mead M.D.   On: 05/31/2016 15:57   Dg Abd Portable 1 View  Result Date: 06/02/2016 CLINICAL DATA:  Encounter for OG tube placement. EXAM: PORTABLE  ABDOMEN - 1 VIEW COMPARISON:  None. FINDINGS: OG tube appropriately positioned in the stomach with tip directed towards the stomach pylorus/duodenal bulb. Visualized bowel gas pattern is nonobstructive. IMPRESSION: OG tube appears appropriately positioned in the stomach. Electronically Signed   By: Bary Richard M.D.   On: 06/02/2016 13:17   Dg Swallowing Func-speech Pathology  Result Date: 06/05/2016 Objective Swallowing Evaluation: Type of Study: MBS-Modified Barium Swallow Study Patient Details Name: TONIQUE MENDONCA MRN: [id number] Date of Birth: 10/18/1965 Today's Date: 06/05/2016 Time: SLP Start Time (ACUTE ONLY): 0936-SLP Stop Time (ACUTE ONLY): 0957 SLP Time Calculation (min) (ACUTE ONLY): 21 min Past Medical History: Past Medical History: Diagnosis Date . Anginal pain (HCC)  . Anxiety  . Bipolar disorder (HCC)  . CAD (coronary artery disease)  . CHF (congestive heart failure) (HCC)  . Chronic lower back pain  . Depression  . Endometriosis  . ESRD (end stage renal disease) on dialysis (HCC)   "[facility]; [address]; [place]; TTS" (01/19/2016) . Gastroparesis  . GERD (gastroesophageal reflux disease)  . History of blood transfusion "several"  "my blood would get low; low RBC" . History of hiatal hernia  . HLD (hyperlipidemia)  . Hypertension  . Migraine   "monthly" (01/19/2016) . Myocardial infarction 2017  "~ 3 wks ago" (01/19/2016) . Renal disorder  . Renal insufficiency  . Seizures (HCC) 07/2015  "I've only had the  1; don't know what from" (01/19/2016) . Type II diabetes mellitus (HCC)  Past Surgical History: Past Surgical History: Procedure Laterality Date . ABDOMINAL HYSTERECTOMY    "partial" . BELOW KNEE LEG AMPUTATION Right 2010? [name] CARDIAC CATHETERIZATION Right 07/10/2015  Procedure: Left Heart Cath and Coronary Angiography;  Surgeon: Laurier Nancy, MD;  Location: [facility];  Service: Cardiovascular;  Laterality: Right; . CARDIAC CATHETERIZATION Right 09/14/2015  Procedure: Left Heart Cath and  Coronary Angiography;  Surgeon: Laurier Nancy, MD;  Location: [facility];  Service: Cardiovascular;  Laterality: Right; . CARDIAC CATHETERIZATION N/A 09/14/2015  Procedure: Coronary Stent Intervention;  Surgeon: Alwyn Pea, MD;  Location: [facility];  Service: Cardiovascular;  Laterality: N/A; . CARDIAC CATHETERIZATION Right 11/20/2015  Procedure: Left Heart Cath and Coronary Angiography;  Surgeon: Laurier Nancy, MD;  Location: [facility];  Service: Cardiovascular;  Laterality: Right; . CORONARY ANGIOPLASTY WITH STENT PLACEMENT  <2017  @ [facility]/notes 07/03/2013 . HYSTEROTOMY   . PERIPHERAL VASCULAR CATHETERIZATION N/A 08/30/2015  Procedure: Dialysis/Perma Catheter Insertion;  Surgeon: Annice Needy, MD;  Location: [facility];  Service: Cardiovascular;  Laterality: N/A; . PERIPHERAL VASCULAR CATHETERIZATION N/A 01/01/2016  Procedure: Dialysis/Perma Catheter Insertion;  Surgeon: Annice Needy, MD;  Location: [facility];  Service: Cardiovascular;  Laterality: N/A; . PERITONEAL CATHETER INSERTION  11/03/2013  [name] 11/03/2013 . PERITONEAL CATHETER REMOVAL  01/01/2016  "took the one from May out; put new PD cath in" (01/19/2016) . PERITONEAL CATHETER REMOVAL  11/03/2013; 02/11/2014  [name] 11/03/2013; Removal of tunneled catheter/notes 02/11/2014 . SALPINGOOPHORECTOMY Right   [name] 07/03/2013 . TUBAL LIGATION   HPI: Pt is a 51 year old female with PMH of seizure (on keppra), ESRD-HD, DM, CHF, GERD, HLD, and CAD who presented to ED with seizure and AMS. Patient was intubated for inability to protect her airway and PCCM was called to admit. Intubated 05/31/16-06/03/16. CXR showed mildly increased left basilar density suggests subsegmental atelectasis. A trace left pleural effusion may be present. There is no pulmonary edema. Thoracic aortic atherosclerosis. MRI of brain showed no acute intracranial abnormality. Remote lacunar infarcts within the posterior circulation. Mild generalized  atrophy and white matter disease is advanced for age. Bedside swallow eval 06/04/16-recommended Dys 3, nectar thick (no straws), meds whole in puree. No Data Recorded Assessment / Plan / Recommendation CHL IP CLINICAL IMPRESSIONS 06/05/2016 Clinical Impression Ms. Sohm presents with a very mild pharyngeal dysphagia characterized by 2 instances of flash penetration with thin liquids (1 with straw, 1 with cup). Pt's swallow was timely and laryngeal closure/airway protection was adequate. Noted minimal vallecular residue with thin liquids throughout the study. No oral or pharyngeal phase impairments with solids. Educated pt and RN re: diet recommendations and pt's swallowing function. Due to deconditioned state and hx of recent intubation, recommend Dys 3 solids, thin liquids (no straws), meds whole in puree. ST will f/u for treatment to check diet tolerance and for possible texture upgrade.   SLP Visit Diagnosis Dysphagia, pharyngeal phase (R13.13) Attention and concentration deficit following -- Frontal lobe and executive function deficit following -- Impact on safety and function Mild aspiration risk   CHL IP TREATMENT RECOMMENDATION 06/05/2016 Treatment Recommendations Therapy as outlined in treatment plan below   Prognosis 06/05/2016 Prognosis for Safe Diet Advancement Good Barriers to Reach Goals -- Barriers/Prognosis Comment -- CHL IP DIET RECOMMENDATION 06/05/2016 SLP Diet Recommendations Dysphagia 3 (Mech soft) solids;Thin liquid Liquid Administration via Cup;No straw Medication Administration  Whole meds with puree Compensations Slow rate;Small sips/bites Postural Changes Seated upright at 90 degrees   CHL IP OTHER RECOMMENDATIONS 06/05/2016 Recommended Consults -- Oral Care Recommendations Oral care BID Other Recommendations --   CHL IP FOLLOW UP RECOMMENDATIONS 06/05/2016 Follow up Recommendations Skilled Nursing facility   [facility] IP FREQUENCY AND DURATION 06/05/2016 Speech Therapy Frequency (ACUTE ONLY) min 2x/week  Treatment Duration 2 weeks      CHL IP ORAL PHASE 06/05/2016 Oral Phase WFL Oral - Pudding Teaspoon -- Oral - Pudding Cup -- Oral - Honey Teaspoon -- Oral - Honey Cup -- Oral - Nectar Teaspoon -- Oral - Nectar Cup -- Oral - Nectar Straw -- Oral - Thin Teaspoon -- Oral - Thin Cup -- Oral - Thin Straw -- Oral - Puree -- Oral - Mech Soft -- Oral - Regular -- Oral - Multi-Consistency -- Oral - Pill -- Oral Phase - Comment --  CHL IP PHARYNGEAL PHASE 06/05/2016 Pharyngeal Phase Impaired Pharyngeal- Pudding Teaspoon -- Pharyngeal -- Pharyngeal- Pudding Cup -- Pharyngeal -- Pharyngeal- Honey Teaspoon -- Pharyngeal -- Pharyngeal- Honey Cup -- Pharyngeal -- Pharyngeal- Nectar Teaspoon -- Pharyngeal -- Pharyngeal- Nectar Cup -- Pharyngeal -- Pharyngeal- Nectar Straw -- Pharyngeal -- Pharyngeal- Thin Teaspoon -- Pharyngeal -- Pharyngeal- Thin Cup Penetration/Aspiration during swallow;Pharyngeal residue - valleculae Pharyngeal Material enters airway, remains ABOVE vocal cords then ejected out Pharyngeal- Thin Straw Penetration/Aspiration during swallow;Pharyngeal residue - valleculae Pharyngeal Material enters airway, remains ABOVE vocal cords then ejected out Pharyngeal- Puree -- Pharyngeal -- Pharyngeal- Mechanical Soft -- Pharyngeal -- Pharyngeal- Regular WFL Pharyngeal -- Pharyngeal- Multi-consistency -- Pharyngeal -- Pharyngeal- Pill -- Pharyngeal -- Pharyngeal Comment --  CHL IP CERVICAL ESOPHAGEAL PHASE 06/05/2016 Cervical Esophageal Phase WFL Pudding Teaspoon -- Pudding Cup -- Honey Teaspoon -- Honey Cup -- Nectar Teaspoon -- Nectar Cup -- Nectar Straw -- Thin Teaspoon -- Thin Cup -- Thin Straw -- Puree -- Mechanical Soft -- Regular -- Multi-consistency -- Pill -- Cervical Esophageal Comment -- No flowsheet data found. Royce Macadamia 06/05/2016, 11:25 AM Breck Coons Lonell Face.Ed CCC-SLP Pager [phone]              Dg Fluoro Guide Lumbar Puncture  Result Date: 05/31/2016 CLINICAL DATA:  51 year old female with altered  mental status, right side weakness. Code stroke workup thus far today is negative for emergent large vessel occlusion or CT evidence of cortically based infarct. Blood cultures are now positive for gram positive cocci. Diagnostic lumbar puncture requested. Initial encounter. EXAM: DIAGNOSTIC LUMBAR PUNCTURE UNDER FLUOROSCOPIC GUIDANCE FLUOROSCOPY TIME:  Fluoroscopy Time:  0 minutes 6 seconds Radiation Exposure Index (if provided by the fluoroscopic device): Number of Acquired Spot Images: 0 PROCEDURE: Informed consent was obtained from the patient's daughter prior to the procedure, including potential complications of headache, allergy, and pain. A "time-out" was performed. With the patient prone, the lower back was prepped with Betadine. 1% Lidocaine was used for local anesthesia. Lumbar puncture was performed at the L3-L4 level using left sub laminar technique and a 3.5 inch x 20 gauge needle with return of clear CSF with an opening pressure of 18 cm of water. 16 mL of CSF were obtained for laboratory studies. The patient tolerated the procedure well and there were no apparent complications. IMPRESSION: Diagnostic lumbar puncture at L3-L4. Clear CSF with opening pressure of 18 cm of water. 16 mL of CSF obtained for laboratory studies. Electronically Signed   By: Odessa Fleming M.D.   On: 05/31/2016 13:50    Micro Results  Recent Results (from the past 240 hour(s))  Anaerobic culture     Status: None   Collection Time: 05/31/16  1:27 PM  Result Value Ref Range Status   Specimen Description CSF  Final   Special Requests NONE  Final   Culture NO ANAEROBES ISOLATED  Final   Report Status 06/05/2016 FINAL  Final  CSF culture     Status: None   Collection Time: 05/31/16  1:27 PM  Result Value Ref Range Status   Specimen Description CSF  Final   Special Requests NONE  Final   Gram Stain   Final    WBC PRESENT, PREDOMINANTLY MONONUCLEAR NO ORGANISMS SEEN CYTOSPIN SMEAR    Culture NO GROWTH 3 DAYS   Final   Report Status 06/03/2016 FINAL  Final  Culture, blood (Routine X 2) w Reflex to ID Panel     Status: None   Collection Time: 05/31/16  5:00 PM  Result Value Ref Range Status   Specimen Description BLOOD RIGHT WRIST  Final   Special Requests IN PEDIATRIC BOTTLE 2CC  Final   Culture NO GROWTH 5 DAYS  Final   Report Status 06/05/2016 FINAL  Final  Culture, blood (Routine X 2) w Reflex to ID Panel     Status: Abnormal   Collection Time: 05/31/16  5:03 PM  Result Value Ref Range Status   Specimen Description BLOOD RIGHT HAND  Final   Special Requests IN PEDIATRIC BOTTLE 2CC  Final   Culture  Setup Time   Final    GRAM POSITIVE COCCI IN CLUSTERS AEROBIC BOTTLE ONLY CRITICAL RESULT CALLED TO, READ BACK BY AND VERIFIED WITH: M SHUDA 06/02/16 @ 0840 M VESTAL    Culture (A)  Final    STAPHYLOCOCCUS SPECIES (COAGULASE NEGATIVE) THE SIGNIFICANCE OF ISOLATING THIS ORGANISM FROM A SINGLE SET OF BLOOD CULTURES WHEN MULTIPLE SETS ARE DRAWN IS UNCERTAIN. PLEASE NOTIFY THE MICROBIOLOGY DEPARTMENT WITHIN ONE WEEK IF SPECIATION AND SENSITIVITIES ARE REQUIRED.    Report Status 06/04/2016 FINAL  Final  Blood Culture ID Panel (Reflexed)     Status: Abnormal   Collection Time: 05/31/16  5:03 PM  Result Value Ref Range Status   Enterococcus species NOT DETECTED NOT DETECTED Final   Listeria monocytogenes NOT DETECTED NOT DETECTED Final   Staphylococcus species DETECTED (A) NOT DETECTED Final    Comment: Methicillin (oxacillin) resistant coagulase negative staphylococcus. Possible blood culture contaminant (unless isolated from more than one blood culture draw or clinical case suggests pathogenicity). No antibiotic treatment is indicated for blood  culture contaminants. CRITICAL RESULT CALLED TO, READ BACK BY AND VERIFIED WITH: M SHUDA 06/02/16 @ 0840 M VESTAL    Staphylococcus aureus NOT DETECTED NOT DETECTED Final   Methicillin resistance DETECTED (A) NOT DETECTED Final    Comment: CRITICAL RESULT  CALLED TO, READ BACK BY AND VERIFIED WITH: M SHUDA 06/02/16 @ 0840 M VESTAL    Streptococcus species NOT DETECTED NOT DETECTED Final   Streptococcus agalactiae NOT DETECTED NOT DETECTED Final   Streptococcus pneumoniae NOT DETECTED NOT DETECTED Final   Streptococcus pyogenes NOT DETECTED NOT DETECTED Final   Acinetobacter baumannii NOT DETECTED NOT DETECTED Final   Enterobacteriaceae species NOT DETECTED NOT DETECTED Final   Enterobacter cloacae complex NOT DETECTED NOT DETECTED Final   Escherichia coli NOT DETECTED NOT DETECTED Final   Klebsiella oxytoca NOT DETECTED NOT DETECTED Final   Klebsiella pneumoniae NOT DETECTED NOT DETECTED Final   Proteus species NOT DETECTED NOT DETECTED Final   Serratia marcescens  NOT DETECTED NOT DETECTED Final   Haemophilus influenzae NOT DETECTED NOT DETECTED Final   Neisseria meningitidis NOT DETECTED NOT DETECTED Final   Pseudomonas aeruginosa NOT DETECTED NOT DETECTED Final   Candida albicans NOT DETECTED NOT DETECTED Final   Candida glabrata NOT DETECTED NOT DETECTED Final   Candida krusei NOT DETECTED NOT DETECTED Final   Candida parapsilosis NOT DETECTED NOT DETECTED Final   Candida tropicalis NOT DETECTED NOT DETECTED Final  MRSA PCR Screening     Status: None   Collection Time: 05/31/16  7:29 PM  Result Value Ref Range Status   MRSA by PCR NEGATIVE NEGATIVE Final    Comment:        The GeneXpert MRSA Assay (FDA approved for NASAL specimens only), is one component of a comprehensive MRSA colonization surveillance program. It is not intended to diagnose MRSA infection nor to guide or monitor treatment for MRSA infections.   Culture, respiratory (NON-Expectorated)     Status: None   Collection Time: 05/31/16 11:12 PM  Result Value Ref Range Status   Specimen Description TRACHEAL ASPIRATE  Final   Special Requests NONE  Final   Gram Stain   Final    RARE WBC PRESENT, PREDOMINANTLY PMN RARE SQUAMOUS EPITHELIAL CELLS PRESENT RARE GRAM  POSITIVE COCCOBACILLUS    Culture Consistent with normal respiratory flora.  Final   Report Status 06/03/2016 FINAL  Final  Culture, blood (Routine X 2) w Reflex to ID Panel     Status: None   Collection Time: 06/03/16  9:45 AM  Result Value Ref Range Status   Specimen Description BLOOD RIGHT HAND  Final   Special Requests IN PEDIATRIC BOTTLE 2CC  Final   Culture NO GROWTH 5 DAYS  Final   Report Status 06/08/2016 FINAL  Final  Culture, blood (Routine X 2) w Reflex to ID Panel     Status: None   Collection Time: 06/03/16 10:20 AM  Result Value Ref Range Status   Specimen Description BLOOD LEFT HAND  Final   Special Requests BOTTLES DRAWN AEROBIC AND ANAEROBIC 5CC  Final   Culture NO GROWTH 5 DAYS  Final   Report Status 06/08/2016 FINAL  Final    Today   Subjective    Hiilani Jetter today has no headache,no chest abdominal pain,no new weakness tingling or numbness, feels much better    Objective   Blood pressure (!) 144/68, pulse 79, temperature 98 F (36.7 C), temperature source Oral, resp. rate 18, height 5\' 9"  (1.753 m), weight 100.5 kg (221 lb 9 oz), last menstrual period 03/16/2015, SpO2 98 %.   Intake/Output Summary (Last 24 hours) at 06/09/16 1140 Last data filed at 06/08/16 1600  Gross per 24 hour  Intake              120 ml  Output             1450 ml  Net            -1330 ml    Exam Awake Alert, Oriented x 2, No new F.N deficits, Normal affect [place].AT,PERRAL Supple Neck,No JVD, No cervical lymphadenopathy appriciated.  Symmetrical Chest wall movement, Good air movement bilaterally, CTAB RRR,No Gallops,Rubs or new Murmurs, No Parasternal Heave +ve B.Sounds, Abd Soft, Non tender, No organomegaly appriciated, No rebound -guarding or rigidity. No Cyanosis, Clubbing or edema, No new Rash or bruise, R BKA   Data Review   CBC w Diff:  Lab Results  Component Value Date   WBC 6.2  06/06/2016   HGB 9.7 (L) 06/06/2016   HGB 11.5 (L) 07/17/2014   HCT 32.0 (L)  06/06/2016   HCT 35.2 07/17/2014   PLT 266 06/06/2016   PLT 316 07/17/2014   LYMPHOPCT 24 05/31/2016   LYMPHOPCT 29.3 07/17/2014   MONOPCT 6 05/31/2016   MONOPCT 5.5 07/17/2014   EOSPCT 3 05/31/2016   EOSPCT 1.2 07/17/2014   BASOPCT 1 05/31/2016   BASOPCT 0.7 07/17/2014    CMP:  Lab Results  Component Value Date   NA 136 06/06/2016   NA 136 (A) 03/27/2016   NA 136 07/17/2014   K 3.7 06/06/2016   K 3.5 07/17/2014   CL 101 06/06/2016   CL 103 07/17/2014   CO2 26 06/06/2016   CO2 26 07/17/2014   BUN 22 (H) 06/06/2016   BUN 46 (A) 03/27/2016   BUN 32 (H) 07/17/2014   CREATININE 3.86 (H) 06/06/2016   CREATININE 3.45 (H) 07/17/2014   GLU 156 03/27/2016   PROT 5.7 (L) 06/06/2016   PROT 5.7 (L) 07/16/2014   ALBUMIN 3.1 (L) 06/06/2016   ALBUMIN 2.6 (L) 07/16/2014   BILITOT 0.6 06/06/2016   BILITOT 0.4 07/16/2014   ALKPHOS 53 06/06/2016   ALKPHOS 104 07/16/2014   AST 19 06/06/2016   AST 14 (L) 07/16/2014   ALT 21 06/06/2016   ALT 9 (L) 07/16/2014  .   Total Time in preparing paper work, data evaluation and todays exam - 35 minutes  Leroy Sea M.D on 06/09/2016 at 11:40 AM  [facility]   Office  [phone]

## 2016-06-09 NOTE — Progress Notes (Signed)
Call placed to [facility] re: possible pt tx today.  Await return call.  Pollyann Savoy, LCSW Weekend Coverage [phone]

## 2016-06-09 NOTE — Progress Notes (Signed)
[facility] Progress Note   Subjective: no complaints  Vitals:   06/08/16 1835 06/08/16 2143 06/09/16 0439 06/09/16 0608  BP: (!) 152/67 (!) 158/63  (!) 144/68  Pulse: (!) 56 65  79  Resp: 17 18  18   Temp: 98.3 F (36.8 C) 97.9 F (36.6 C)  98 F (36.7 C)  TempSrc: Oral Oral  Oral  SpO2: 97% 98%  98%  Weight:   100.5 kg (221 lb 9 oz)   Height:        Inpatient medications: . amLODipine  10 mg Oral Daily  . clopidogrel  75 mg Oral Daily  . darbepoetin (ARANESP) injection - DIALYSIS  60 mcg Intravenous Q Thu-HD  . feeding supplement (NEPRO CARB STEADY)  237 mL Oral BID WC  . heparin  4,000 Units Dialysis Once in dialysis  . heparin  5,000 Units Subcutaneous Q8H  . hydrALAZINE  50 mg Oral Daily  . insulin aspart  0-5 Units Subcutaneous QHS  . insulin aspart  0-9 Units Subcutaneous TID WC  . isosorbide mononitrate  90 mg Oral Daily  . lactulose  10 g Oral Daily  . levETIRAcetam  1,000 mg Oral Daily  . levETIRAcetam  500 mg Oral Q T,Th,Sat-1800  . lisinopril  10 mg Oral Daily  . metoprolol  100 mg Oral BID  . pantoprazole  40 mg Oral BID AC  . polyethylene glycol  17 g Oral Daily  . risperiDONE  1 mg Oral BID    alum & mag hydroxide-simeth, fentaNYL (SUBLIMAZE) injection, hydrALAZINE, loratadine, olopatadine, oxyCODONE  Exam: No distress, chron ill appearing Head/ lip tremors cont to improve Slow speech No jvd Chest clear bilat RRR Abd obese, soft ntnd Ext 1-2 +pitting edema Neuro more alert, gen'd weakness, conversant, Ox 3 L IJ TDC  Dialysis: [facility] on [address]/ TTS 4h  108kg  2K bath  Hep 2000 then 900/hr   Permcath  - EPO 6000 tiw  - Iron 100 biw    Assessment: 1. Seizures - acute on chronic. LP neg.  Head CT/ MRI neg. Continues on Keppra, as prior to admission.  2. 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 Never has had a perm access.  3. HTN - on 4 BP meds at home and here, BP's good 4. Volume -  euvolemic, under dry wt by 7-8kg 5. Anemia of CKD - Hb ok at 10, got darbe 60 ug on 3/8 here 6. EPS - pretty bad on admission, due to risperdal/ reglan, both have been dc'd, improved. Have listed Reglan and risperidone as intolerances in allergy section.  7. Debility/ EOL - has been bedridden for about 1 year, in and out of SNF's and hospitals per pt for the last year. Feels her QOL is fairly good and wants to continue all medical treatments.  8. Dispo - for dc to SNF possibly soon per pt  Plan - cont HD TTS  Vinson Moselle MD [facility] pager [phone]   06/09/2016, 9:58 AM    Recent Labs Lab 06/03/16 0228 06/04/16 0311 06/06/16 0231  NA 137 136 136  K 4.2 3.5 3.7  CL 97* 98* 101  CO2 25 26 26   GLUCOSE 118* 123* 147*  BUN 39* 22* 22*  CREATININE 5.29* 3.82* 3.86*  CALCIUM 10.3 9.8 9.6  PHOS  --  4.3  --     Recent Labs Lab 06/04/16 0311 06/06/16 0231  AST  --  19  ALT  --  21  ALKPHOS  --  53  BILITOT  --  0.6  PROT  --  5.7*  ALBUMIN 3.5 3.1*    Recent Labs Lab 06/03/16 0228 06/04/16 0311 06/06/16 0231  WBC 7.9 9.3 6.2  HGB 11.1* 11.2* 9.7*  HCT 34.5* 35.6* 32.0*  MCV 87.8 90.4 90.4  PLT 306 255 266   Iron/TIBC/Ferritin/ %Sat    Component Value Date/Time   IRON 24 (L) 07/12/2013 0509   TIBC 214 (L) 07/12/2013 0509   FERRITIN 183 07/12/2013 0509   IRONPCTSAT 11 07/12/2013 0509

## 2016-06-09 NOTE — Discharge Instructions (Signed)
Seizure, Adult When you have a seizure:  Parts of your body may move.  How aware or awake (conscious) you are may change.  You may shake (convulse). Some people have symptoms right before a seizure happens. These symptoms may include:  Fear.  Worry (anxiety).  Feeling like you are going to throw up (nausea).  Feeling like the room is spinning (vertigo).  Feeling like you saw or heard something before (deja vu).  Odd tastes or smells.  Changes in vision, such as seeing flashing lights or spots. Seizures usually last from 30 seconds to 2 minutes. Usually, they are not harmful unless they last a long time. Follow these instructions at home: Medicines   Take over-the-counter and prescription medicines only as told by your doctor.  Avoid anything that may keep your medicine from working, such as alcohol. Activity   Do not do any activities that would be dangerous if you had another seizure, like driving or swimming. Wait until your doctor approves.  If you live in the [place] ask your local DMV (department of motor vehicles) when you can drive.  Rest. Teaching others   Teach friends and family what to do when you have a seizure. They should:  Lay you on the ground.  Protect your head and body.  Loosen any tight clothing around your neck.  Turn you on your side.  Stay with you until you are better.  Not hold you down.  Not put anything in your mouth.  Know whether or not you need emergency care. General instructions   Contact your doctor each time you have a seizure.  Avoid anything that gives you seizures.  Keep a seizure diary. Write down:  What you think caused each seizure.  What you remember about each seizure.  Keep all follow-up visits as told by your doctor. This is important. Contact a doctor if:  You have another seizure.  You have seizures more often.  There is any change in what happens during your seizures.  You continue to have  seizures with treatment.  You have symptoms of being sick or having an infection. Get help right away if:  You have a seizure:  That lasts longer than 5 minutes.  That is different than seizures you had before.  That makes it harder to breathe.  After you hurt your head.  After a seizure, you cannot speak or use a part of your body.  After a seizure, you are confused or have a bad headache.  You have two or more seizures in a row.  You are having seizures more often.  You do not wake up right after a seizure.  You get hurt during a seizure. In an emergency:  These symptoms may be an emergency. Do not wait to see if the symptoms will go away. Get medical help right away. Call your local emergency services (911 in the [place] Do not drive yourself to the hospital. This information is not intended to replace advice given to you by your health care provider. Make sure you discuss any questions you have with your health care provider. Document Released: 09/04/2007 Document Revised: 11/29/2015 Document Reviewed: 11/29/2015 Elsevier Interactive Patient Education  2017 [employer] Follow with Primary MD Amada Jupiter, Jordan Hawks, MD and your psychiatrist within 7 days   Get CBC, CMP, 2 view Chest X ray checked  by Primary MD or SNF MD in 5-7 days ( we routinely change or add medications that can affect your baseline labs and  fluid status, therefore we recommend that you get the mentioned basic workup next visit with your PCP, your PCP may decide not to get them or add new tests based on their clinical decision)  Activity: As tolerated with Full fall precautions use walker/cane & assistance as needed  Disposition SNF  Diet:   DIET DYS 3 with feeding assistance and aspiration precautions.  For Heart failure patients - Check your Weight same time everyday, if you gain over 2 pounds, or you develop in leg swelling, experience more shortness of breath or chest pain, call your Primary MD  immediately. Follow Cardiac Low Salt Diet and 1.5 lit/day fluid restriction.  On your next visit with your primary care physician please Get Medicines reviewed and adjusted.  Please request your Prim.MD to go over all Hospital Tests and Procedure/Radiological results at the follow up, please get all Hospital records sent to your Prim MD by signing hospital release before you go home.  If you experience worsening of your admission symptoms, develop shortness of breath, life threatening emergency, suicidal or homicidal thoughts you must seek medical attention immediately by calling 911 or calling your MD immediately  if symptoms less severe.  You Must read complete instructions/literature along with all the possible adverse reactions/side effects for all the Medicines you take and that have been prescribed to you. Take any new Medicines after you have completely understood and accpet all the possible adverse reactions/side effects.   Do not drive, operate heavy machinery, perform activities at heights, swimming or participation in water activities or provide baby sitting services if your were admitted for syncope or siezures until you have seen by Primary MD or a Neurologist and advised to do so again.  Do not drive when taking Pain medications.    Do not take more than prescribed Pain, Sleep and Anxiety Medications  Special Instructions: If you have smoked or chewed Tobacco  in the last 2 yrs please stop smoking, stop any regular Alcohol  and or any Recreational drug use.  Wear Seat belts while driving.   Please note  You were cared for by a hospitalist during your hospital stay. If you have any questions about your discharge medications or the care you received while you were in the hospital after you are discharged, you can call the unit and asked to speak with the hospitalist on call if the hospitalist that took care of you is not available. Once you are discharged, your primary care  physician will handle any further medical issues. Please note that NO REFILLS for any discharge medications will be authorized once you are discharged, as it is imperative that you return to your primary care physician (or establish a relationship with a primary care physician if you do not have one) for your aftercare needs so that they can reassess your need for medications and monitor your lab values.

## 2016-06-09 NOTE — Progress Notes (Signed)
Physical Therapy Treatment Patient Details Name: Kimberly Montgomery MRN: [id number] DOB: [birth year] Today's Date: 06/09/2016    History of Present Illness 51 year old female with PMH of seizure (on keppra), ESRD-HD, DM and CHF who presents to the ED with seizure and AMS.PMH significant for CAD, Bipolar disorder, anxiety, chronic low back pain, depression, HLD, HTN. Pt found unresponsive in SNF and brought by EMS to [facility]. Pt believed to have had seizure in SNF. Pt intubated 3/2-3/6.     PT Comments    Pt performed increased mobility as evident by completing squat pivot from bed to chair with +2 external assistance.  Pt able to follow commands for hand placement.  Remains to present with poor sitting balance with L lateral lean and posterior lean.  Current plan for return to SNF remains appropriate.      Follow Up Recommendations  SNF;Supervision/Assistance - 24 hour     Equipment Recommendations  None recommended by PT    Recommendations for Other Services       Precautions / Restrictions Precautions Precautions: Fall Precaution Comments: R BKA Restrictions Weight Bearing Restrictions: No    Mobility  Bed Mobility Overal bed mobility: Needs Assistance Bed Mobility: Supine to Sit     Supine to sit: Mod assist;+2 for physical assistance;HOB elevated     General bed mobility comments: Pt required assist with B LEs to edge of bed and mod +2 assist to elevate trunk into sitting.  Pt presents with Lateral lean to the L, L hip externally rotates and patient has difficulty sitting  upright with out support due to lack of support from her L leg.    Transfers Overall transfer level: Needs assistance Equipment used: None Transfers: Squat Pivot Transfers     Squat pivot transfers: Max assist;+2 physical assistance     General transfer comment: Pt able to follow commands for hand placement, PTA blocked and stabilized LLE.  Pt able to pivot LLE from bed to chair.  Maximove sling  placed under patient for safe transfer back to bed.    Ambulation/Gait                 Stairs            Wheelchair Mobility    Modified Rankin (Stroke Patients Only)       Balance Overall balance assessment: Needs assistance   Sitting balance-Leahy Scale: Poor Sitting balance - Comments: min to mod assist to maintain sitting balance with L lateral lean and posterior lean observed.       Standing balance-Leahy Scale: Zero Standing balance comment: No ability to maintain balance in standing.                      Cognition Arousal/Alertness: Awake/alert Behavior During Therapy: Flat affect Overall Cognitive Status: History of cognitive impairments - at baseline                 General Comments: hx of bipolar, anxierty and depression; pt with some anxiety about falling when initially seated EOB and during transfer.      Exercises General Exercises - Lower Extremity Long Arc Quad: AROM;Left;10 reps;Seated Hip Flexion/Marching: AROM;Left;10 reps;Seated Amputee Exercises Hip Flexion/Marching: AROM;Right;10 reps;Seated Knee Flexion: AROM;Right;10 reps;Seated Knee Extension: AROM;Right;10 reps;Seated Other Exercises Other Exercises: Heel cord stretch to LLE to improve dorsiflexion 3x 30 sec holds.      General Comments        Pertinent Vitals/Pain Pain Assessment: No/denies pain Pain Score:  0-No pain Faces Pain Scale: No hurt    Home Living                      Prior Function            PT Goals (current goals can now be found in the care plan section) Acute Rehab PT Goals Patient Stated Goal: to get better Potential to Achieve Goals: Fair Progress towards PT goals: Progressing toward goals    Frequency    Min 3X/week      PT Plan Current plan remains appropriate    Co-evaluation             End of Session Equipment Utilized During Treatment: Gait belt Activity Tolerance: Patient tolerated treatment  well Patient left: in chair;with call bell/phone within reach;with chair alarm set Nurse Communication: Mobility status PT Visit Diagnosis: Other abnormalities of gait and mobility (R26.89);Muscle weakness (generalized) (M62.81);Pain Pain - part of body: Arm;Leg (back)     Time: 2130-8657 PT Time Calculation (min) (ACUTE ONLY): 26 min  Charges:  $Therapeutic Exercise: 8-22 mins $Therapeutic Activity: 8-22 mins                    G Codes:       Kimberly Montgomery 30-Jun-2016, 11:54 AM Joycelyn Rua, PTA pager [phone]

## 2016-06-10 LAB — GLUCOSE, CAPILLARY
GLUCOSE-CAPILLARY: 142 mg/dL — AB (ref 65–99)
GLUCOSE-CAPILLARY: 142 mg/dL — AB (ref 65–99)
Glucose-Capillary: 154 mg/dL — ABNORMAL HIGH (ref 65–99)

## 2016-06-10 MED ORDER — ALPRAZOLAM 0.5 MG PO TABS
0.5000 mg | ORAL_TABLET | Freq: Two times a day (BID) | ORAL | Status: DC | PRN
  Administered 2016-06-10: 0.5 mg via ORAL
  Filled 2016-06-10: qty 1

## 2016-06-10 NOTE — Progress Notes (Signed)
[facility]                                                                                                                                                                         Patient Demographics  Roslind Ramp, is a 51 y.o. female  FBX:[id number]  NVB:[id number]  DOB - 1965/12/25  Admit date - 05/31/2016  Admitting Physician Alyson Reedy, MD  Outpatient Primary MD for the patient is DALE, Jordan Hawks, MD  LOS - 10   Chief Complaint  Patient presents with  . Code Stroke        Assessment & Plan    Patient seen briefly today due for discharge soon per Discharge done yesterday by me yesterday, no further issues, Vital signs stable, patient feels fine.  She could not be discharged yesterday due to SNF bed availability, today she will be dialyzed thereafter will be discharged back to SNF.    Medications  Scheduled Meds: . amLODipine  10 mg Oral Daily  . clopidogrel  75 mg Oral Daily  . darbepoetin (ARANESP) injection - DIALYSIS  60 mcg Intravenous Q Thu-HD  . feeding supplement (NEPRO CARB STEADY)  237 mL Oral BID WC  . heparin  4,000 Units Dialysis Once in dialysis  . heparin  5,000 Units Subcutaneous Q8H  . hydrALAZINE  50 mg Oral Daily  . insulin aspart  0-5 Units Subcutaneous QHS  . insulin aspart  0-9 Units Subcutaneous TID WC  . isosorbide mononitrate  90 mg Oral Daily  . lactulose  10 g Oral Daily  . levETIRAcetam  1,000 mg Oral Daily  . levETIRAcetam  500 mg Oral Q T,Th,Sat-1800  . lisinopril  10 mg Oral Daily  . metoprolol  100 mg Oral BID  . pantoprazole  40 mg Oral BID AC  . polyethylene glycol  17 g Oral Daily  . risperiDONE  1 mg Oral BID   Continuous Infusions: PRN Meds:.ALPRAZolam, alum & mag hydroxide-simeth, fentaNYL (SUBLIMAZE) injection, hydrALAZINE, loratadine, olopatadine, oxyCODONE    Time Spent in minutes   10 minutes   Leroy Sea M.D on 06/10/2016 at  9:33 AM  Between 7am to 7pm - Pager - [phone]  After 7pm go to [email or web address] - password [email or web address]  And look for the night coverage person covering for me after hours  [facility] Office  [phone]    Subjective:   Aylinn Mehle today has, No headache, No chest pain, No abdominal pain - No Nausea, No new weakness tingling or numbness, No Cough - SOB.    Objective:   Vitals:   06/09/16 1758 06/09/16 2008 06/10/16 0500 06/10/16 0541  BP: (!) 189/72 (!) 159/70  (!) 151/58  Pulse: (!) 59 62  (!)  58  Resp: 18 18  20   Temp: 98.2 F (36.8 C) 98.5 F (36.9 C)  98.4 F (36.9 C)  TempSrc: Oral Oral  Oral  SpO2: 97% 98%  98%  Weight:   100.7 kg (222 lb)   Height:        Wt Readings from Last 3 Encounters:  06/10/16 100.7 kg (222 lb)  05/18/16 110.4 kg (243 lb 4.8 oz)  05/15/16 112.9 kg (248 lb 14.4 oz)    No intake or output data in the 24 hours ending 06/10/16 0933  Exam Awake Alert, Oriented X 2, No new F.N deficits, Normal affect [place].AT,PERRAL Supple Neck,No JVD, No cervical lymphadenopathy appriciated.  Symmetrical Chest wall movement, Good air movement bilaterally, CTAB RRR,No Gallops,Rubs or new Murmurs, No Parasternal Heave +ve B.Sounds, Abd Soft, Non tender, No organomegaly appriciated, No rebound - guarding or rigidity. No Cyanosis, Clubbing or edema, No new Rash or bruise , R BKA   Data Review

## 2016-06-10 NOTE — Care Management Important Message (Signed)
Important Message  Patient Details  Name: Kimberly Montgomery MRN: [id number] Date of Birth: 01-Mar-1966   Medicare Important Message Given:  Yes    Dorena Bodo 06/10/2016, 12:17 PM

## 2016-06-10 NOTE — Care Management Note (Signed)
Case Management Note  Patient Details  Name: Kimberly Montgomery MRN: [id number] Date of Birth: June 26, 1965  Subjective/Objective:                    Action/Plan: Pt discharging to SNF rehab today. No further needs per CM.   Expected Discharge Date:  06/09/16               Expected Discharge Plan:  Skilled Nursing Facility (From long term SNF)  In-House Referral:  Clinical Social Work  Discharge planning Services  CM Consult  Post Acute Care Choice:    Choice offered to:     DME Arranged:    DME Agency:     HH Arranged:    HH Agency:     Status of Service:  Completed, signed off  If discussed at [employer], dates discussed:    Additional Comments:  Kermit Balo, RN 06/10/2016, 12:12 PM

## 2016-06-10 NOTE — Clinical Social Work Note (Signed)
CSW requested insurance auth from [facility]. CSW spoke with [facility] CM Synetta Shadow [phone] ext. [phone]), who stated clinicals have to be forwarded to medical director for review and will not hear back until tomorrow. Auth not received on this day. CSW will continue to follow.   Corlis Hove, LCSWA, LCASA Clinical Social Work [phone]

## 2016-06-10 NOTE — Progress Notes (Signed)
Occupational Therapy Treatment Patient Details Name: Kimberly Montgomery MRN: [id number] DOB: [birth year] Today's Date: 06/10/2016    History of present illness 51 year old female with PMH of seizure (on keppra), ESRD-HD, DM and CHF who presents to the ED with seizure and AMS.PMH significant for CAD, Bipolar disorder, anxiety, chronic low back pain, depression, HLD, HTN. Pt found unresponsive in SNF and brought by EMS to [facility]. Pt believed to have had seizure in SNF. Pt intubated 3/2-3/6.    OT comments  Pt progressing toward OT goals. She was able to sit at EOB for approximately 10 minutes during ADL tasks initially with min assist but was able to progress to min guard level. She was able to complete 3 grooming tasks this session with min assist while seated at EOB. Challenged B UE AROM and strength as well as core strength with functional tasks incorporating PNF patterns. Pt required multi-modal cues to follow commands during functional activities this session. D/C plan remains appropriate.   Follow Up Recommendations  SNF;Supervision/Assistance - 24 hour    Equipment Recommendations  None recommended by OT    Recommendations for Other Services      Precautions / Restrictions Precautions Precautions: Fall Precaution Comments: R BKA Restrictions Weight Bearing Restrictions: No       Mobility Bed Mobility Overal bed mobility: Needs Assistance Bed Mobility: Supine to Sit     Supine to sit: Mod assist;+2 for physical assistance;HOB elevated Sit to supine: Mod assist;Max assist;+2 for physical assistance   General bed mobility comments: Assist to raise trunk from bed during supine to sit. Assist to bring legs onto bed with sit to supine.  Transfers                 General transfer comment: Did not attempt this session.    Balance Overall balance assessment: Needs assistance Sitting-balance support: Feet supported;Single extremity supported;No upper extremity  supported Sitting balance-Leahy Scale: Fair Sitting balance - Comments: Initially min assist but able to progress to requiring close supervision for 5 minutes.                           ADL Overall ADL's : Needs assistance/impaired     Grooming: Minimal assistance;Sitting;Wash/dry face;Oral care;Brushing hair Grooming Details (indicate cue type and reason): Up to min assist for sitting balance as well.                               General ADL Comments: Completed B UE strengthening exercises in PNF patterns while challenging sitting balance at EOB.      Vision                     Perception     Praxis      Cognition   Behavior During Therapy: Flat affect Overall Cognitive Status: History of cognitive impairments - at baseline                  General Comments: hx of bipolar, anxierty and depression; pt with some anxiety about falling when initially seated EOB and during transfer.        Exercises     Shoulder Instructions       General Comments      Pertinent Vitals/ Pain       Pain Assessment: Faces Faces Pain Scale: Hurts even more Pain Location: back and buttocks Pain Descriptors /  Indicators: Sore Pain Intervention(s): Limited activity within patient's tolerance;Monitored during session;Repositioned  Home Living                                          Prior Functioning/Environment              Frequency  Min 2X/week        Progress Toward Goals  OT Goals(current goals can now be found in the care plan section)  Progress towards OT goals: Progressing toward goals  Acute Rehab OT Goals Patient Stated Goal: to get better OT Goal Formulation: With patient/family Time For Goal Achievement: 06/19/16 Potential to Achieve Goals: Good ADL Goals Pt Will Perform Eating: sitting;with set-up Pt Will Perform Grooming: with min assist;sitting (3 tasks) Additional ADL Goal #1: Pt will sit EOB x10  minutes with min guard assist as precursor to ADL. Additional ADL Goal #2: Pt/caregiver will independently perfrom AAROM bil UEs.  Plan Discharge plan remains appropriate    Co-evaluation                 End of Session Equipment Utilized During Treatment: Oxygen  OT Visit Diagnosis: Other abnormalities of gait and mobility (R26.89);Muscle weakness (generalized) (M62.81)   Activity Tolerance Patient tolerated treatment well   Patient Left in bed;with call bell/phone within reach;with bed alarm set   Nurse Communication          Time: 1610-9604 OT Time Calculation (min): 22 min  Charges: OT General Charges $OT Visit: 1 Procedure OT Treatments $Self Care/Home Management : 8-22 mins  Doristine Section, MS OTR/L  Pager: [phone]    Jared Whorley A Humaira Sculley 06/10/2016, 4:43 PM

## 2016-06-11 LAB — CBC
HEMATOCRIT: 30.6 % — AB (ref 36.0–46.0)
Hemoglobin: 10 g/dL — ABNORMAL LOW (ref 12.0–15.0)
MCH: 28.9 pg (ref 26.0–34.0)
MCHC: 32.7 g/dL (ref 30.0–36.0)
MCV: 88.4 fL (ref 78.0–100.0)
Platelets: 383 10*3/uL (ref 150–400)
RBC: 3.46 MIL/uL — ABNORMAL LOW (ref 3.87–5.11)
RDW: 13.7 % (ref 11.5–15.5)
WBC: 6.6 10*3/uL (ref 4.0–10.5)

## 2016-06-11 LAB — RENAL FUNCTION PANEL
ALBUMIN: 3.3 g/dL — AB (ref 3.5–5.0)
Anion gap: 9 (ref 5–15)
BUN: 16 mg/dL (ref 6–20)
CO2: 29 mmol/L (ref 22–32)
CREATININE: 4.41 mg/dL — AB (ref 0.44–1.00)
Calcium: 10.4 mg/dL — ABNORMAL HIGH (ref 8.9–10.3)
Chloride: 90 mmol/L — ABNORMAL LOW (ref 101–111)
GFR calc Af Amer: 12 mL/min — ABNORMAL LOW (ref >60)
GFR, EST NON AFRICAN AMERICAN: 11 mL/min — AB (ref >60)
Glucose, Bld: 137 mg/dL — ABNORMAL HIGH (ref 65–99)
PHOSPHORUS: 4.2 mg/dL (ref 2.5–4.6)
POTASSIUM: 4.3 mmol/L (ref 3.5–5.1)
Sodium: 128 mmol/L — ABNORMAL LOW (ref 135–145)

## 2016-06-11 LAB — GLUCOSE, CAPILLARY: Glucose-Capillary: 98 mg/dL (ref 65–99)

## 2016-06-11 MED ORDER — HEPARIN SODIUM (PORCINE) 1000 UNIT/ML DIALYSIS: 40.0000 [IU]/kg | INTRAMUSCULAR | Status: DC | PRN

## 2016-06-11 MED ORDER — FENTANYL CITRATE (PF) 100 MCG/2ML IJ SOLN
INTRAMUSCULAR | Status: AC
  Filled 2016-06-11: qty 2

## 2016-06-11 NOTE — Progress Notes (Signed)
Patient ID: Kimberly Montgomery, female   DOB: [birth year], 51 y.o.   MRN: [id number]   [facility] Progress Note   Assessment/ Plan:   1. Seizures: Patient with acute on chronic breakthrough seizures with negative workup including head CT/MRI and lumbar puncture testing. Back on anticonvulsant medications as she was prior to admission. No seizures since admission and mental status improving. 2.ESRD hemodialysis ordered for today to continue her usual outpatient Tuesday/Thursday/Saturday schedule-she appears to be close to euvolemic and without any critical symptoms to prompt acute intervention. Labs at dialysis. 3. Anemia: Denies any overt blood loss, continue Aranesp with hemodialysis weekly. 4. CKD-MBD: Calcium/phosphorus levels within acceptable limits-monitor labs from today 5. Nutrition: With significant debilitation and remains bedridden for the past year-continue protein supplementation as indicated 6. Hypertension: Elevated blood pressures noted-anticipated to improve with ultrafiltration and hemodialysis  Subjective:   Reports to be feeling fair-requesting help with instillation of eyedrops    Objective:   BP 131/89 (BP Location: Left Arm)   Pulse (!) 55   Temp 97.7 F (36.5 C) (Oral)   Resp 18   Ht 5\' 9"  (1.753 m)   Wt 100.7 kg (222 lb)   LMP 03/16/2015   SpO2 100%   BMI 32.78 kg/m   Physical Exam: TDH:RCBULAGTXMI resting in bed CVS: Pulse regular bradycardia, S1 and S2 normal Resp: Clear to auscultation bilaterally-no rales or rhonchi Abd: Soft, obese, nontender Ext: 1-2+ pitting edema  Labs: BMET  Recent Labs Lab 06/06/16 0231  NA 136  K 3.7  CL 101  CO2 26  GLUCOSE 147*  BUN 22*  CREATININE 3.86*  CALCIUM 9.6   CBC  Recent Labs Lab 06/06/16 0231  WBC 6.2  HGB 9.7*  HCT 32.0*  MCV 90.4  PLT 266   Medications:    . amLODipine  10 mg Oral Daily  . clopidogrel  75 mg Oral Daily  . darbepoetin (ARANESP) injection - DIALYSIS  60 mcg  Intravenous Q Thu-HD  . feeding supplement (NEPRO CARB STEADY)  237 mL Oral BID WC  . heparin  4,000 Units Dialysis Once in dialysis  . heparin  5,000 Units Subcutaneous Q8H  . hydrALAZINE  50 mg Oral Daily  . insulin aspart  0-5 Units Subcutaneous QHS  . insulin aspart  0-9 Units Subcutaneous TID WC  . isosorbide mononitrate  90 mg Oral Daily  . lactulose  10 g Oral Daily  . levETIRAcetam  1,000 mg Oral Daily  . levETIRAcetam  500 mg Oral Q T,Th,Sat-1800  . lisinopril  10 mg Oral Daily  . metoprolol  100 mg Oral BID  . pantoprazole  40 mg Oral BID AC  . polyethylene glycol  17 g Oral Daily  . risperiDONE  1 mg Oral BID   Zetta Bills, MD 06/11/2016, 9:57 AM

## 2016-06-11 NOTE — Clinical Social Work Placement (Signed)
   CLINICAL SOCIAL WORK PLACEMENT  NOTE  Date:  06/11/2016  Patient Details  Name: Kimberly Montgomery MRN: [id number] Date of Birth: 1966-03-10  Clinical Social Work is seeking post-discharge placement for this patient at the Skilled  Nursing Facility level of care (*CSW will initial, date and re-position this form in  chart as items are completed):  Yes   Patient/family provided with [place] Clinical Social Work Department's list of facilities offering this level of care within the geographic area requested by the patient (or if unable, by the patient's family).  Yes   Patient/family informed of their freedom to choose among providers that offer the needed level of care, that participate in Medicare, Medicaid or managed care program needed by the patient, have an available bed and are willing to accept the patient.  Yes   Patient/family informed of [facility]'s ownership interest in [facility] and [facility], as well as of the fact that they are under no obligation to receive care at these facilities.  PASRR submitted to EDS on 06/05/16     PASRR number received on 06/05/16     Existing PASRR number confirmed on 06/05/16     FL2 transmitted to all facilities in geographic area requested by pt/family on 06/05/16     FL2 transmitted to all facilities within larger geographic area on 06/05/16     Patient informed that his/her managed care company has contracts with or will negotiate with certain facilities, including the following:        Yes   Patient/family informed of bed offers received.  Patient chooses bed at [facility]     Physician recommends and patient chooses bed at      Patient to be transferred to [facility] on 06/11/16.  Patient to be transferred to facility by [facility]     Patient family notified on 06/11/16 of transfer.  Name of family member notified:        PHYSICIAN       Additional Comment:  Pt is ready for discharge  today and will go to [facility]. Pt and dtr are aware and agreeable to discharge plan. CSW sent clinicals to [facility] and communicated with Jomarie Longs (admissions) for room and report. [facility] Medicare insurance Berkley Harvey is still pending. Per Synetta Shadow at [facility], clinicals were sent to medical director for a peer to peer review. CSW staffed with MD and provided MD cell number to [facility]. CSW spoke with [facility] at [facility] and they are willing to accept pt under her LTC [employer]. Per [facility] Longs, [facility] verified [facility] Medicaid is active. [facility] is ready to [facility] today. Room and report provided to RN and put in treatment team sticky note. Transportation arranged with [facility]. CSW called and left a voicemail for daughter-Heather Durwin Nora [phone]). CSW is signing off as no further SW needs identified. _______________________________________________ Dominic Pea, LCSW 06/11/2016, 4:44 PM

## 2016-06-11 NOTE — Progress Notes (Signed)
[facility]                                                                                                                                                                         Patient Demographics  Kimberly Montgomery, is a 51 y.o. female  ZOX:[id number]  [id number]:[id number]  DOB - [birth year]  Admit date - 05/31/2016  Admitting Physician Alyson Reedy, MD  Outpatient Primary MD for the patient is DALE, Jordan Hawks, MD  LOS - 11   Chief Complaint  Patient presents with  . Code Stroke        Assessment & Plan    Patient seen briefly today due for discharge soon per Discharge done yesterday by me on 06-10-16, no further issues, Vital signs stable, patient feels fine.  She could not be discharged yesterday due to SNF bed availability, today she will be dialyzed thereafter will be discharged back to SNF.    Medications  Scheduled Meds: . amLODipine  10 mg Oral Daily  . clopidogrel  75 mg Oral Daily  . darbepoetin (ARANESP) injection - DIALYSIS  60 mcg Intravenous Q Thu-HD  . feeding supplement (NEPRO CARB STEADY)  237 mL Oral BID WC  . heparin  4,000 Units Dialysis Once in dialysis  . heparin  5,000 Units Subcutaneous Q8H  . hydrALAZINE  50 mg Oral Daily  . insulin aspart  0-5 Units Subcutaneous QHS  . insulin aspart  0-9 Units Subcutaneous TID WC  . isosorbide mononitrate  90 mg Oral Daily  . lactulose  10 g Oral Daily  . levETIRAcetam  1,000 mg Oral Daily  . levETIRAcetam  500 mg Oral Q T,Th,Sat-1800  . lisinopril  10 mg Oral Daily  . metoprolol  100 mg Oral BID  . pantoprazole  40 mg Oral BID AC  . polyethylene glycol  17 g Oral Daily  . risperiDONE  1 mg Oral BID   Continuous Infusions: PRN Meds:.ALPRAZolam, alum & mag hydroxide-simeth, fentaNYL (SUBLIMAZE) injection, hydrALAZINE, loratadine, olopatadine, oxyCODONE    Time Spent in minutes   10 minutes   Susa Raring K M.D on 06/11/2016 at  11:58 AM  Between 7am to 7pm - Pager - [phone]  After 7pm go to [email or web address] - password [email or web address]  And look for the night coverage person covering for me after hours  [facility] Office  [phone]    Subjective:   Kimberly Montgomery today has, No headache, No chest pain, No abdominal pain - No Nausea, No new weakness tingling or numbness, No Cough - SOB.    Objective:   Vitals:   06/10/16 2000 06/11/16 0015 06/11/16 0556 06/11/16 1025  BP: (!) 181/51 (!) 170/60 131/89 (!) 196/57  Pulse: (!) 54 (!) 59 [name])  55 (!) 54  Resp: 18 18 18 17   Temp: 98.5 F (36.9 C) 98.3 F (36.8 C) 97.7 F (36.5 C) 97.9 F (36.6 C)  TempSrc: Oral Oral Oral Oral  SpO2: 100% 100% 100% 100%  Weight:      Height:        Wt Readings from Last 3 Encounters:  06/10/16 100.7 kg (222 lb)  05/18/16 110.4 kg (243 lb 4.8 oz)  05/15/16 112.9 kg (248 lb 14.4 oz)    No intake or output data in the 24 hours ending 06/11/16 1158  Exam Awake Alert, Oriented X 2, No new F.N deficits, Normal affect [place].AT,PERRAL Supple Neck,No JVD, No cervical lymphadenopathy appriciated.  Symmetrical Chest wall movement, Good air movement bilaterally, CTAB RRR,No Gallops,Rubs or new Murmurs, No Parasternal Heave +ve B.Sounds, Abd Soft, Non tender, No organomegaly appriciated, No rebound - guarding or rigidity. No Cyanosis, Clubbing or edema, No new Rash or bruise , R BKA   Data Review

## 2016-06-11 NOTE — Progress Notes (Signed)
Physical Therapy Treatment Patient Details Name: Kimberly Montgomery MRN: [id number] DOB: 1965/07/01 Today's Date: 06/11/2016    History of Present Illness 51 year old female with PMH of seizure (on keppra), ESRD-HD, DM and CHF who presents to the ED with seizure and AMS.PMH significant for CAD, Bipolar disorder, anxiety, chronic low back pain, depression, HLD, HTN. Pt found unresponsive in SNF and brought by EMS to [facility]. Pt believed to have had seizure in SNF. Pt intubated 3/2-3/6.     PT Comments    Pt progressing towards physical therapy goals. With Stedy and max +2 support, pt was able to work on sit<>stand this session. Pt motivated to achieve stand with encouragement from therapist, and showed good UE use with pulling up on Stedy, as well as improved hip extension in standing.  Noted heavy L lateral lean in standing but was able to brace herself on equipment. Pt was returned to bed at end of session as she is scheduled for dialysis this morning. Will continue to follow.   Follow Up Recommendations  SNF;Supervision/Assistance - 24 hour     Equipment Recommendations  None recommended by PT    Recommendations for Other Services       Precautions / Restrictions Precautions Precautions: Fall Precaution Comments: R BKA Restrictions Weight Bearing Restrictions: No    Mobility  Bed Mobility Overal bed mobility: Needs Assistance Bed Mobility: Supine to Sit     Supine to sit: Mod assist;+2 for physical assistance;HOB elevated Sit to supine: Mod assist;Max assist;+2 for physical assistance   General bed mobility comments: Assist to raise trunk from bed during supine to sit. Assist to bring legs onto bed with sit to supine.  Transfers Overall transfer level: Needs assistance   Transfers: Sit to/from Stand Sit to Stand: Max assist;+2 physical assistance         General transfer comment: With stedy, pt was able to attempt sit<>stand x5 trials. Pt was able to clear hips x3 and  achieve a semi-standing position on the last 2 trials.   Ambulation/Gait                 Stairs            Wheelchair Mobility    Modified Rankin (Stroke Patients Only)       Balance Overall balance assessment: Needs assistance Sitting-balance support: Feet supported;Single extremity supported;No upper extremity supported Sitting balance-Leahy Scale: Fair   Postural control: Posterior lean;Right lateral lean   Standing balance-Leahy Scale: Zero Standing balance comment: No ability to maintain balance in standing.                      Cognition Arousal/Alertness: Awake/alert Behavior During Therapy: Flat affect Overall Cognitive Status: History of cognitive impairments - at baseline                 General Comments: hx of bipolar, anxierty and depression; pt with some anxiety about falling when initially seated EOB and during transfer.      Exercises      General Comments        Pertinent Vitals/Pain Pain Assessment: Faces Faces Pain Scale: Hurts a little bit Pain Location: back and buttocks Pain Descriptors / Indicators: Sore Pain Intervention(s): Limited activity within patient's tolerance;Monitored during session;Repositioned    Home Living Family/patient expects to be discharged to:: Skilled nursing facility                    Prior Function  PT Goals (current goals can now be found in the care plan section) Acute Rehab PT Goals PT Goal Formulation: With patient Time For Goal Achievement: 06/19/16 Potential to Achieve Goals: Fair Progress towards PT goals: Progressing toward goals    Frequency    Min 3X/week      PT Plan Current plan remains appropriate    Co-evaluation             End of Session Equipment Utilized During Treatment: Gait belt Activity Tolerance: Patient tolerated treatment well Patient left: in bed;with call bell/phone within reach;with bed alarm set Nurse Communication:  Mobility status PT Visit Diagnosis: Other abnormalities of gait and mobility (R26.89);Muscle weakness (generalized) (M62.81);Pain Pain - part of body:  (back)     Time: 0034-9179 PT Time Calculation (min) (ACUTE ONLY): 22 min  Charges:  $Therapeutic Activity: 8-22 mins                    G Codes:       CAMBREIGH CRISAFI 2016/06/21, 9:29 AM   Conni Slipper, PT, DPT Acute Rehabilitation Services Pager: [phone]

## 2016-06-11 NOTE — Progress Notes (Signed)
CSW spoke with patient's daughter. She is requesting CSW to call/text [phone]) patient's daughter once you hear back from insurance regarding discharge plan. Patient will dc by [facility].  Osborne Casco Neftaly Inzunza LCSWA [phone]

## 2016-06-14 LAB — CULTURE, BLOOD (ROUTINE X 2)
CULTURE: NO GROWTH
Culture: NO GROWTH

## 2016-06-17 ENCOUNTER — Encounter: Payer: Self-pay | Admitting: Diagnostic Neuroimaging

## 2016-06-17 ENCOUNTER — Ambulatory Visit (INDEPENDENT_AMBULATORY_CARE_PROVIDER_SITE_OTHER): Payer: Medicare HMO | Admitting: Diagnostic Neuroimaging

## 2016-06-17 VITALS — BP 126/67 | HR 51

## 2016-06-17 DIAGNOSIS — G40909 Epilepsy, unspecified, not intractable, without status epilepticus: Secondary | ICD-10-CM

## 2016-06-17 DIAGNOSIS — Z992 Dependence on renal dialysis: Secondary | ICD-10-CM | POA: Diagnosis not present

## 2016-06-17 DIAGNOSIS — N186 End stage renal disease: Secondary | ICD-10-CM

## 2016-06-17 NOTE — Progress Notes (Signed)
[facility]  PATIENT: Kimberly SENSENEY DOB: 1965/11/28  REFERRING CLINICIAN: Hospitalist (P Thedore Mins) HISTORY FROM: patient and chart review REASON FOR VISIT: new consult    HISTORICAL  CHIEF COMPLAINT:  Chief Complaint  Patient presents with  . Seizures    rm 7, New Pt, currently at [facility]    HISTORY OF PRESENT ILLNESS:   51 year old female with hypertension, end-stage renal disease, here for evaluation of seizures. Patient presented to [facility] on 05/31/16 for altered mental status, hypertensive emergency, seizure activity. Patient was treated with Keppra and seizure stopped. He also was having tremors, possibly extrapyramidal symptoms related to Reglan and Risperdal. Tremors gradually reduced with medication adjustment. Patient also had coag-negative methicillin-resistant staph bacteremia, felt to be contamination. Transthoracic and transesophageal echocardiogram were unremarkable.  Since that time no further seizures. Patient tolerating medications. Patient living at [facility] skilled nursing facility in [place].    REVIEW OF SYSTEMS: Full 14 system review of systems performed and negative with exception of: Only as per history of present illness.  ALLERGIES: Allergies  Allergen Reactions  . Cephalosporins Anaphylaxis    Patient has tolerated meropenem.   [name] Penicillins Anaphylaxis and Other (See Comments)    Has patient had a PCN reaction causing immediate rash, facial/tongue/throat swelling, SOB or lightheadedness with hypotension: Yes Has patient had a PCN reaction causing severe rash involving mucus membranes or skin necrosis: No Has patient had a PCN reaction that required hospitalization No Has patient had a PCN reaction occurring within the last 10 years: No If all of the above answers are "NO", then may proceed with Cephalosporin use.  . Reglan [Metoclopramide] Other (See Comments)    Severe EPS (lip, head, body  tremors) March 2018  . Risperdal [Risperidone] Other (See Comments)    Severe EPS (lip, head, body tremors) March 2018  . Lamictal [Lamotrigine] Other (See Comments)    Reaction:  Hallucinations  . Phenergan [Promethazine Hcl] Nausea And Vomiting  . Pravastatin Other (See Comments)    Reaction:  Muscle pain   . Sulfa Antibiotics Other (See Comments)    Reaction:  Unknown     HOME MEDICATIONS: Outpatient Medications Prior to Visit  Medication Sig Dispense Refill  . Amino Acids-Protein Hydrolys (FEEDING SUPPLEMENT, PRO-STAT SUGAR FREE 64,) LIQD Take 30 mLs by mouth 3 (three) times daily with meals.    [name] amLODipine (NORVASC) 10 MG tablet Take 10 mg by mouth daily.     . carboxymethylcellulose (REFRESH) 1 % ophthalmic solution Place 1 drop into both eyes 3 (three) times daily.    . clopidogrel (PLAVIX) 75 MG tablet Take 75 mg by mouth daily.    . fluticasone (FLONASE) 50 MCG/ACT nasal spray Place 1 spray into both nostrils daily.     [name] gabapentin (NEURONTIN) 300 MG capsule Take 300 mg by mouth 3 (three) times daily.    . hydrALAZINE (APRESOLINE) 50 MG tablet Take 1 tablet (50 mg total) by mouth 3 (three) times daily.    . insulin aspart (NOVOLOG) 100 UNIT/ML injection Inject 10 Units into the skin 3 (three) times daily with meals.     . insulin glargine (LANTUS) 100 UNIT/ML injection Inject 0.08 mLs (8 Units total) into the skin at bedtime. 10 mL 11  . isosorbide mononitrate (IMDUR) 60 MG 24 hr tablet Take 1.5 tablets (90 mg total) by mouth daily. 30 tablet 00  . lactulose (CHRONULAC) 10 GM/15ML solution Take 10 g by mouth daily.     [name]  levETIRAcetam (KEPPRA) 500 MG tablet Take 3 tablets (1,500 mg total) by mouth every morning. 30 tablet 0  . Lidocaine-Menthol 4-1 % PTCH Apply 1 patch topically at bedtime. Apply lumbar region. Remove after 12 hours.    [name] lisinopril (PRINIVIL,ZESTRIL) 10 MG tablet Take 1 tablet (10 mg total) by mouth daily.    . metoprolol (LOPRESSOR) 50 MG tablet Take 1  tablet (50 mg total) by mouth 2 (two) times daily.    . multivitamin (RENA-VIT) TABS tablet Take 1 tablet by mouth daily.    . mupirocin cream (BACTROBAN) 2 % Apply 1 application topically 2 (two) times daily. Apply into nose.    [name] omeprazole (PRILOSEC) 20 MG capsule Take 20 mg by mouth 2 (two) times daily before a meal.     . ondansetron (ZOFRAN ODT) 4 MG disintegrating tablet Take 1 tablet (4 mg total) by mouth every 6 (six) hours as needed for nausea or vomiting. 20 tablet 0  . oxyCODONE-acetaminophen (PERCOCET/ROXICET) 5-325 MG tablet Take 1 tablet by mouth every 12 (twelve) hours as needed for severe pain. Do not exceed 4gm of Tylenol in 24 hours 10 tablet 0  . polyethylene glycol (MIRALAX / GLYCOLAX) packet Take 17 g by mouth daily. 14 each 0  . senna (SENOKOT) 8.6 MG TABS tablet Take 2 tablets by mouth 2 (two) times daily.     [name] buPROPion (WELLBUTRIN SR) 200 MG 12 hr tablet Take 200 mg by mouth daily.    . calcium carbonate (TUMS - DOSED IN MG ELEMENTAL CALCIUM) 500 MG chewable tablet Chew 1 tablet (200 mg of elemental calcium total) by mouth 3 (three) times daily with meals. 120 tablet 0  . nitroGLYCERIN (NITROSTAT) 0.4 MG SL tablet Place 0.4 mg under the tongue every 5 (five) minutes as needed for chest pain. Reported on 07/10/2015    . sevelamer carbonate (RENVELA) 800 MG tablet Take 800 mg by mouth See admin instructions. Take 1 tablet four times a day. (with each meal and with snack.)    . torsemide (DEMADEX) 100 MG tablet Take 100 mg by mouth daily.      No facility-administered medications prior to visit.     PAST MEDICAL HISTORY: Past Medical History:  Diagnosis Date  . Anginal pain (HCC)   . Anxiety   . Bipolar disorder (HCC)   . CAD (coronary artery disease)   . CHF (congestive heart failure) (HCC)   . Chronic lower back pain   . Depression   . Endometriosis   . ESRD (end stage renal disease) on dialysis (HCC)    "[facility]; [address]; [place]; TTS" (01/19/2016)  .  Gastroparesis   . GERD (gastroesophageal reflux disease)   . History of blood transfusion "several"   "my blood would get low; low RBC"  . History of hiatal hernia   . HLD (hyperlipidemia)   . Hypertension   . Migraine    "monthly" (01/19/2016)  . Myocardial infarction 2017   "~ 3 wks ago" (01/19/2016)  . Renal disorder   . Renal insufficiency   . Seizures (HCC) 07/2015   "I've only had the 1; don't know what from" (01/19/2016)  . Type II diabetes mellitus (HCC)     PAST SURGICAL HISTORY: Past Surgical History:  Procedure Laterality Date  . ABDOMINAL HYSTERECTOMY     "partial"  . BELOW KNEE LEG AMPUTATION Right 2010?  [name] CARDIAC CATHETERIZATION Right 07/10/2015   Procedure: Left Heart Cath and Coronary Angiography;  Surgeon: Laurier Nancy, MD;  Location: [facility] INVASIVE CV LAB;  Service: Cardiovascular;  Laterality: Right;  . CARDIAC CATHETERIZATION Right 09/14/2015   Procedure: Left Heart Cath and Coronary Angiography;  Surgeon: Laurier Nancy, MD;  Location: [facility] INVASIVE CV LAB;  Service: Cardiovascular;  Laterality: Right;  . CARDIAC CATHETERIZATION N/A 09/14/2015   Procedure: Coronary Stent Intervention;  Surgeon: Alwyn Pea, MD;  Location: [facility] INVASIVE CV LAB;  Service: Cardiovascular;  Laterality: N/A;  . CARDIAC CATHETERIZATION Right 11/20/2015   Procedure: Left Heart Cath and Coronary Angiography;  Surgeon: Laurier Nancy, MD;  Location: [facility] INVASIVE CV LAB;  Service: Cardiovascular;  Laterality: Right;  . CORONARY ANGIOPLASTY WITH STENT PLACEMENT  <2017   @ [facility]/notes 07/03/2013  . HYSTEROTOMY    . PERIPHERAL VASCULAR CATHETERIZATION N/A 08/30/2015   Procedure: Dialysis/Perma Catheter Insertion;  Surgeon: Annice Needy, MD;  Location: [facility] INVASIVE CV LAB;  Service: Cardiovascular;  Laterality: N/A;  . PERIPHERAL VASCULAR CATHETERIZATION N/A 01/01/2016   Procedure: Dialysis/Perma Catheter Insertion;  Surgeon: Annice Needy, MD;  Location: [facility] INVASIVE CV LAB;  Service:  Cardiovascular;  Laterality: N/A;  . PERITONEAL CATHETER INSERTION  11/03/2013   [name] 11/03/2013  . PERITONEAL CATHETER REMOVAL  01/01/2016   "took the one from May out; put new PD cath in" (01/19/2016)  . PERITONEAL CATHETER REMOVAL  11/03/2013; 02/11/2014   [name] 11/03/2013; Removal of tunneled catheter/notes 02/11/2014  . SALPINGOOPHORECTOMY Right    [name] 07/03/2013  . TEE WITHOUT CARDIOVERSION N/A 06/07/2016   Procedure: TRANSESOPHAGEAL ECHOCARDIOGRAM (TEE);  Surgeon: Chrystie Nose, MD;  Location: [facility] ENDOSCOPY;  Service: Cardiovascular;  Laterality: N/A;  . TUBAL LIGATION      FAMILY HISTORY: Family History  Problem Relation Age of Onset  . CAD    . Diabetes    . Bipolar disorder    . Cervical cancer Mother     SOCIAL HISTORY:  Social History   Social History  . Marital status: Widowed    Spouse name: N/A  . Number of children: 2  . Years of education: N/A   Occupational History  . disabled    Social History Main Topics  . Smoking status: Former Smoker    Packs/day: 0.75    Types: Cigarettes    Quit date: 07/01/2015  . Smokeless tobacco: Never Used  . Alcohol use 0.0 oz/week     Comment: 01/19/2016 "might have a couple drinks/year"  . Drug use: No  . Sexual activity: Not Currently   Other Topics Concern  . Not on file   Social History Narrative   06/17/16 currently at [facility]     PHYSICAL EXAM  GENERAL EXAM/CONSTITUTIONAL: Vitals:  Vitals:   06/17/16 0906  BP: 126/67  Pulse: (!) 51     There is no height or weight on file to calculate BMI.  No exam data present  Patient is in no distress; well developed, nourished and groomed; neck is supple  SLOW MOVEMENTS THROUGHTOUT  CARDIOVASCULAR:  Examination of carotid arteries is normal; no carotid bruits  Regular rate and rhythm, no murmurs  Examination of peripheral vascular system by observation and palpation is normal  LEFT SUBCLAVIAN CENTRAL CATHETER DIALYSIS ACCESS; CLEAN AND  DRY  EYES:  Ophthalmoscopic exam of optic discs and posterior segments is normal; no papilledema or hemorrhages  MUSCULOSKELETAL:  Gait, strength, tone, movements noted in Neurologic exam below  NEUROLOGIC: MENTAL STATUS:  No flowsheet data found.  awake, alert, oriented to person, place and time  recent and remote memory  intact  normal attention and concentration  language fluent, comprehension intact, naming intact,   fund of knowledge appropriate  CRANIAL NERVE:   2nd - no papilledema on fundoscopic exam  2nd, 3rd, 4th, 6th - pupils equal and reactive to light, visual fields full to confrontation, extraocular muscles --> END GAZE NYSTAGMUS IN ALL DIRECTIONS  5th - facial sensation symmetric  7th - facial strength symmetric  8th - hearing intact  9th - palate elevates symmetrically, uvula midline  11th - shoulder shrug symmetric  12th - tongue protrusion midline  MOTOR:   FINE POSTURAL TREMOR IN BUE  normal bulk and tone, full strength in the BUE, LLE  RIGHT BELOW KNEE AMPUTATION  SENSORY:   normal and symmetric to light touch, temperature, vibration  EXCEPT ABSENT VIB IN LEFT FOOT AND KNEE  COORDINATION:   finger-nose-finger, fine finger movements SLOW  REFLEXES:   deep tendon reflexes TRACE and symmetric  RIGHT BELOW KNEE AMPUTATION  GAIT/STATION:   IN WHEELCHAIR; RIGHT BELOW KNEE AMPUTATION; CANNOT STAND UP    DIAGNOSTIC DATA (LABS, IMAGING, TESTING) - I reviewed patient records, labs, notes, testing and imaging myself where available.  Lab Results  Component Value Date   WBC 6.6 06/11/2016   HGB 10.0 (L) 06/11/2016   HCT 30.6 (L) 06/11/2016   MCV 88.4 06/11/2016   PLT 383 06/11/2016      Component Value Date/Time   NA 128 (L) 06/11/2016 1203   NA 136 (A) 03/27/2016   NA 136 07/17/2014 0417   K 4.3 06/11/2016 1203   K 3.5 07/17/2014 0417   CL 90 (L) 06/11/2016 1203   CL 103 07/17/2014 0417   CO2 29 06/11/2016 1203   CO2  26 07/17/2014 0417   GLUCOSE 137 (H) 06/11/2016 1203   GLUCOSE 221 (H) 07/17/2014 0417   BUN 16 06/11/2016 1203   BUN 46 (A) 03/27/2016   BUN 32 (H) 07/17/2014 0417   CREATININE 4.41 (H) 06/11/2016 1203   CREATININE 3.45 (H) 07/17/2014 0417   CALCIUM 10.4 (H) 06/11/2016 1203   CALCIUM 8.4 (L) 07/17/2014 0417   PROT 5.7 (L) 06/06/2016 0231   PROT 5.7 (L) 07/16/2014 2132   ALBUMIN 3.3 (L) 06/11/2016 1203   ALBUMIN 2.6 (L) 07/16/2014 2132   AST 19 06/06/2016 0231   AST 14 (L) 07/16/2014 2132   ALT 21 06/06/2016 0231   ALT 9 (L) 07/16/2014 2132   ALKPHOS 53 06/06/2016 0231   ALKPHOS 104 07/16/2014 2132   BILITOT 0.6 06/06/2016 0231   BILITOT 0.4 07/16/2014 2132   GFRNONAA 11 (L) 06/11/2016 1203   GFRNONAA 15 (L) 07/17/2014 0417   GFRAA 12 (L) 06/11/2016 1203   GFRAA 17 (L) 07/17/2014 0417   Lab Results  Component Value Date   CHOL 215 (H) 11/19/2015   HDL 63 11/19/2015   LDLCALC 115 (H) 11/19/2015   TRIG 186 (H) 11/19/2015   CHOLHDL 3.4 11/19/2015   Lab Results  Component Value Date   HGBA1C 6.2 (H) 09/13/2015   Lab Results  Component Value Date   VITAMINB12 635 06/06/2016   Lab Results  Component Value Date   TSH 2.417 06/01/2016    05/31/16 MRI brain 1. No acute intracranial abnormality to explain 7 not altered mental status. 2. Remote lacunar infarcts within the posterior circulation as described. 3. Mild generalized atrophy and white matter disease is advanced for age.  06/02/16 continuous VEEG - This is an abnormal EEGdue to the presence of the following: 1) Generalized polymorphic delta  slowing; 2) Intermittent triphasic waves. Taken together, these results suggest severe encephalopathy, most likely due to toxic-metabolic etiology, but medication effect could not be excluded.No epileptiform discharges or seizures were present.       ASSESSMENT AND PLAN  51 y.o. year old female here with end-stage renal disease on hemodialysis, presenting with  hypertensive emergency, tremors and seizures. Now on antiseizure medication and stable. Extrapyramidal symptoms and tremors have subsided.   Dx:  1. Seizure disorder (HCC)   2. ESRD on dialysis [facility])      PLAN: - continue levetiracetam 1500mg  daily - continue other medications per PCP  Return in about 6 months (around 12/18/2016).    Suanne Marker, MD 06/17/2016, 9:26 AM Certified in Neurology, Neurophysiology and Neuroimaging  [facility] [address] [place], [place] [address] [phone]

## 2016-07-04 ENCOUNTER — Other Ambulatory Visit: Payer: Self-pay | Admitting: Family Medicine

## 2016-07-04 DIAGNOSIS — Z1231 Encounter for screening mammogram for malignant neoplasm of breast: Secondary | ICD-10-CM

## 2016-07-18 ENCOUNTER — Ambulatory Visit (INDEPENDENT_AMBULATORY_CARE_PROVIDER_SITE_OTHER): Payer: Medicare HMO | Admitting: Vascular Surgery

## 2016-07-23 ENCOUNTER — Ambulatory Visit
Admission: RE | Admit: 2016-07-23 | Discharge: 2016-07-23 | Disposition: A | Discharge status: Home / Self Care | Payer: Medicare HMO | Source: Ambulatory Visit | Attending: Family Medicine | Admitting: Family Medicine

## 2016-07-23 DIAGNOSIS — Z1231 Encounter for screening mammogram for malignant neoplasm of breast: Secondary | ICD-10-CM

## 2016-07-29 ENCOUNTER — Other Ambulatory Visit
Admission: RE | Admit: 2016-07-29 | Discharge: 2016-07-29 | Disposition: A | Discharge status: Home / Self Care | Payer: Medicare HMO | Source: Ambulatory Visit | Attending: Family Medicine | Admitting: Family Medicine

## 2016-07-29 DIAGNOSIS — R531 Weakness: Secondary | ICD-10-CM | POA: Diagnosis present

## 2016-07-29 LAB — CBC WITH DIFFERENTIAL/PLATELET
BASOS PCT: 0 %
Basophils Absolute: 0 10*3/uL (ref 0–0.1)
EOS ABS: 0 10*3/uL (ref 0–0.7)
EOS PCT: 0 %
HCT: 35.3 % (ref 35.0–47.0)
Hemoglobin: 11.9 g/dL — ABNORMAL LOW (ref 12.0–16.0)
LYMPHS ABS: 0.9 10*3/uL — AB (ref 1.0–3.6)
Lymphocytes Relative: 7 %
MCH: 29.7 pg (ref 26.0–34.0)
MCHC: 33.7 g/dL (ref 32.0–36.0)
MCV: 88.1 fL (ref 80.0–100.0)
MONO ABS: 0.4 10*3/uL (ref 0.2–0.9)
MONOS PCT: 3 %
Neutro Abs: 12.1 10*3/uL — ABNORMAL HIGH (ref 1.4–6.5)
Neutrophils Relative %: 90 %
Platelets: 252 10*3/uL (ref 150–440)
RBC: 4.01 MIL/uL (ref 3.80–5.20)
RDW: 13.9 % (ref 11.5–14.5)
WBC: 13.4 10*3/uL — ABNORMAL HIGH (ref 3.6–11.0)

## 2016-07-29 LAB — URINALYSIS, COMPLETE (UACMP) WITH MICROSCOPIC
BILIRUBIN URINE: NEGATIVE
Bacteria, UA: NONE SEEN
GLUCOSE, UA: 50 mg/dL — AB
HGB URINE DIPSTICK: NEGATIVE
KETONES UR: NEGATIVE mg/dL
LEUKOCYTES UA: NEGATIVE
NITRITE: NEGATIVE
PH: 7 (ref 5.0–8.0)
Protein, ur: 100 mg/dL — AB
SQUAMOUS EPITHELIAL / LPF: NONE SEEN
Specific Gravity, Urine: 1.009 (ref 1.005–1.030)

## 2016-07-29 LAB — COMPREHENSIVE METABOLIC PANEL
ALBUMIN: 3.4 g/dL — AB (ref 3.5–5.0)
ALT: 11 U/L — ABNORMAL LOW (ref 14–54)
ANION GAP: 11 (ref 5–15)
AST: 16 U/L (ref 15–41)
Alkaline Phosphatase: 97 U/L (ref 38–126)
BUN: 68 mg/dL — ABNORMAL HIGH (ref 6–20)
CO2: 27 mmol/L (ref 22–32)
CREATININE: 5.52 mg/dL — AB (ref 0.44–1.00)
Calcium: 10.1 mg/dL (ref 8.9–10.3)
Chloride: 86 mmol/L — ABNORMAL LOW (ref 101–111)
GFR calc Af Amer: 9 mL/min — ABNORMAL LOW (ref >60)
GFR calc non Af Amer: 8 mL/min — ABNORMAL LOW (ref >60)
GLUCOSE: 117 mg/dL — AB (ref 65–99)
Potassium: 6.6 mmol/L (ref 3.5–5.1)
SODIUM: 124 mmol/L — AB (ref 135–145)
Total Bilirubin: 0.5 mg/dL (ref 0.3–1.2)
Total Protein: 6.4 g/dL — ABNORMAL LOW (ref 6.5–8.1)

## 2016-08-15 ENCOUNTER — Other Ambulatory Visit (INDEPENDENT_AMBULATORY_CARE_PROVIDER_SITE_OTHER): Payer: Self-pay | Admitting: Vascular Surgery

## 2016-08-15 ENCOUNTER — Ambulatory Visit (INDEPENDENT_AMBULATORY_CARE_PROVIDER_SITE_OTHER): Payer: Medicare HMO | Admitting: Vascular Surgery

## 2016-08-15 ENCOUNTER — Encounter (INDEPENDENT_AMBULATORY_CARE_PROVIDER_SITE_OTHER): Payer: Self-pay

## 2016-08-15 ENCOUNTER — Encounter (INDEPENDENT_AMBULATORY_CARE_PROVIDER_SITE_OTHER): Payer: Self-pay | Admitting: Vascular Surgery

## 2016-08-15 ENCOUNTER — Ambulatory Visit (INDEPENDENT_AMBULATORY_CARE_PROVIDER_SITE_OTHER): Payer: Medicare HMO

## 2016-08-15 VITALS — BP 120/62 | HR 56 | Resp 17 | Ht 69.0 in | Wt 220.0 lb

## 2016-08-15 DIAGNOSIS — I10 Essential (primary) hypertension: Secondary | ICD-10-CM

## 2016-08-15 DIAGNOSIS — Z01818 Encounter for other preprocedural examination: Secondary | ICD-10-CM

## 2016-08-15 DIAGNOSIS — R569 Unspecified convulsions: Secondary | ICD-10-CM

## 2016-08-15 DIAGNOSIS — N186 End stage renal disease: Secondary | ICD-10-CM

## 2016-08-15 DIAGNOSIS — E1121 Type 2 diabetes mellitus with diabetic nephropathy: Secondary | ICD-10-CM | POA: Diagnosis not present

## 2016-08-15 DIAGNOSIS — I2510 Atherosclerotic heart disease of native coronary artery without angina pectoris: Secondary | ICD-10-CM | POA: Diagnosis not present

## 2016-08-15 DIAGNOSIS — Z992 Dependence on renal dialysis: Secondary | ICD-10-CM

## 2016-08-18 NOTE — Progress Notes (Signed)
MRN : [id number]  Kimberly Montgomery is a 51 y.o. (1965/06/19) female who presents with chief complaint of  Chief Complaint  Patient presents with  . Re-evaluation    Discuss access placement  .  History of Present Illness: The patient is seen for evaluation of dialysis access.  The patient has a history of multiple failed accesses.  There have been accesses in both arms and in the thighs.    Current access is via a catheter which is functioning adequately.  There have not been any episodes of catheter infection.  The patient denies amaurosis fugax or recent TIA symptoms. There are no recent neurological changes noted. The patient denies claudication symptoms or rest pain symptoms. The patient denies history of DVT, PE or superficial thrombophlebitis. The patient denies recent episodes of angina or shortness of breath.   Vein mapping done today shows an adequate right upper cephalic vein   Current Meds  Medication Sig  . Amino Acids-Protein Hydrolys (FEEDING SUPPLEMENT, PRO-STAT SUGAR FREE 64,) LIQD Take 30 mLs by mouth 3 (three) times daily with meals.  [name] amLODipine (NORVASC) 10 MG tablet Take 10 mg by mouth daily.   . calcium carbonate (TUMS - DOSED IN MG ELEMENTAL CALCIUM) 500 MG chewable tablet Chew 1 tablet (200 mg of elemental calcium total) by mouth 3 (three) times daily with meals.  . carboxymethylcellulose (REFRESH) 1 % ophthalmic solution Place 1 drop into both eyes 3 (three) times daily.  . clopidogrel (PLAVIX) 75 MG tablet Take 75 mg by mouth daily.  . fluticasone (FLONASE) 50 MCG/ACT nasal spray Place 1 spray into both nostrils daily.   [name] gabapentin (NEURONTIN) 300 MG capsule Take 300 mg by mouth 3 (three) times daily.  . hydrALAZINE (APRESOLINE) 50 MG tablet Take 1 tablet (50 mg total) by mouth 3 (three) times daily. (Patient taking differently: Take 75 mg by mouth 3 (three) times daily. )  . insulin aspart (NOVOLOG) 100 UNIT/ML injection Inject 10 Units into the skin 3  (three) times daily with meals.   . insulin detemir (LEVEMIR) 100 UNIT/ML injection Inject 8 Units into the skin at bedtime.   . isosorbide mononitrate (IMDUR) 60 MG 24 hr tablet Take 1.5 tablets (90 mg total) by mouth daily. (Patient not taking: Reported on 08/15/2016)  . lactulose (CHRONULAC) 10 GM/15ML solution Take 10 g by mouth daily.   [name] levETIRAcetam (KEPPRA) 500 MG tablet Take 3 tablets (1,500 mg total) by mouth every morning. (Patient not taking: Reported on 08/15/2016)  . lidocaine (LIDODERM) 5 % Place 1 patch onto the skin at bedtime.   [name] lisinopril (PRINIVIL,ZESTRIL) 10 MG tablet Take 1 tablet (10 mg total) by mouth daily. (Patient taking differently: Take 10 mg by mouth daily at 12 noon. )  . metoprolol (LOPRESSOR) 50 MG tablet Take 1 tablet (50 mg total) by mouth 2 (two) times daily. (Patient taking differently: Take 50 mg by mouth 3 (three) times daily. At 05:00, 1300 and 2100)  . multivitamin (RENA-VIT) TABS tablet Take 1 tablet by mouth daily.  . nitroGLYCERIN (NITROSTAT) 0.4 MG SL tablet Place 0.4 mg under the tongue every 5 (five) minutes as needed for chest pain. Reported on 07/10/2015  . omeprazole (PRILOSEC) 20 MG capsule Take 20 mg by mouth 2 (two) times daily before a meal.   . ondansetron (ZOFRAN ODT) 4 MG disintegrating tablet Take 1 tablet (4 mg total) by mouth every 6 (six) hours as needed for nausea or vomiting. (Patient taking differently: Take 4  mg by mouth every 8 (eight) hours as needed for nausea or vomiting. )  . oxyCODONE-acetaminophen (PERCOCET/ROXICET) 5-325 MG tablet Take 1 tablet by mouth every 12 (twelve) hours as needed for severe pain. Do not exceed 4gm of Tylenol in 24 hours  . polyethylene glycol (MIRALAX / GLYCOLAX) packet Take 17 g by mouth daily. (Patient taking differently: Take 17 g by mouth daily at 12 noon. )  . senna (SENOKOT) 8.6 MG TABS tablet Take 2 tablets by mouth 2 (two) times daily.   . sevelamer carbonate (RENVELA) 800 MG tablet Take 800 mg by  mouth See admin instructions. Take 1 tablet four times a day. (with each meal and with snack.)  . torsemide (DEMADEX) 100 MG tablet Take 100 mg by mouth daily.   . [DISCONTINUED] buPROPion (WELLBUTRIN SR) 200 MG 12 hr tablet Take 200 mg by mouth daily.  . [DISCONTINUED] Lidocaine-Menthol 4-1 % PTCH Apply 1 patch topically at bedtime. Apply lumbar region. Remove after 12 hours.  . [DISCONTINUED] mupirocin cream (BACTROBAN) 2 % Apply 1 application topically 2 (two) times daily. Apply into nose.    Past Medical History:  Diagnosis Date  . Anginal pain (HCC)   . Anxiety   . Bipolar disorder (HCC)   . CAD (coronary artery disease)   . CHF (congestive heart failure) (HCC)   . Chronic lower back pain   . Depression   . Endometriosis   . ESRD (end stage renal disease) on dialysis (HCC)    "[facility]; [address]; [place]; TTS" (01/19/2016)  . Gastroparesis   . GERD (gastroesophageal reflux disease)   . History of blood transfusion "several"   "my blood would get low; low RBC"  . History of hiatal hernia   . HLD (hyperlipidemia)   . Hypertension   . Migraine    "monthly" (01/19/2016)  . Myocardial infarction (HCC) 2017   "~ 3 wks ago" (01/19/2016)  . Renal disorder   . Renal insufficiency   . Seizures (HCC) 07/2015   "I've only had the 1; don't know what from" (01/19/2016)  . Type II diabetes mellitus (HCC)     Past Surgical History:  Procedure Laterality Date  . ABDOMINAL HYSTERECTOMY     "partial"  . BELOW KNEE LEG AMPUTATION Right 2010?  [name] CARDIAC CATHETERIZATION Right 07/10/2015   Procedure: Left Heart Cath and Coronary Angiography;  Surgeon: Laurier Nancy, MD;  Location: [facility] INVASIVE CV LAB;  Service: Cardiovascular;  Laterality: Right;  . CARDIAC CATHETERIZATION Right 09/14/2015   Procedure: Left Heart Cath and Coronary Angiography;  Surgeon: Laurier Nancy, MD;  Location: [facility] INVASIVE CV [facility];  Service: Cardiovascular;  Laterality: Right;  . CARDIAC CATHETERIZATION N/A  09/14/2015   Procedure: Coronary Stent Intervention;  Surgeon: Alwyn Pea, MD;  Location: [facility] INV[facility]IVE CV LAB;  Service: Cardiovascular;  Laterality: N/A;  . CARDIAC CATHETERIZATION Right 11/20/2015   Procedure: Left Heart Cath and Coronary Angiography;  Surgeon: Laurier Nancy, MD;  Location: [facility] INVASIVE CV LAB;  Service: Cardiovascular;  Laterality: Right;  . CORONARY ANGIOPLASTY WITH STENT PLACEMENT  <2017   @ [facility]/notes 07/03/2013  . HYSTEROTOMY    . PERIPHERAL VASCULAR CATHETERIZATION N/A 08/30/2015   Procedure: Dialysis/Perma Catheter Insertion;  Surgeon: Annice Needy, MD;  Location: [facility] INV[facility]IVE CV LAB;  Service: Cardiovascular;  Laterality: N/A;  . PERIPHERAL VASCULAR CATHETERIZATION N/A 01/01/2016   Procedure: Dialysis/Perma Catheter Insertion;  Surgeon: Annice Needy, MD;  Location: [facility] INV[facility] CV LAB;  Service: Cardiovascular;  Laterality: N/A;  .  PERITONEAL CATHETER INSERTION  11/03/2013   [name] 11/03/2013  . PERITONEAL CATHETER REMOVAL  01/01/2016   "took the one from May out; put new PD cath in" (01/19/2016)  . PERITONEAL CATHETER REMOVAL  11/03/2013; 02/11/2014   [name] 11/03/2013; Removal of tunneled catheter/notes 02/11/2014  . SALPINGOOPHORECTOMY Right    [name] 07/03/2013  . TEE WITHOUT CARDIOVERSION N/A 06/07/2016   Procedure: TRANSESOPHAGEAL ECHOCARDIOGRAM (TEE);  Surgeon: Chrystie Nose, MD;  Location: [facility];  Service: Cardiovascular;  Laterality: N/A;  . TUBAL LIGATION      Social History Social History  Substance Use Topics  . Smoking status: Former Smoker    Packs/day: 0.75    Types: Cigarettes    Quit date: 07/01/2015  . Smokeless tobacco: Never Used  . Alcohol use 0.0 oz/week     Comment: 01/19/2016 "might have a couple drinks/year"    Family History Family History  Problem Relation Age of Onset  . CAD Unknown   . Diabetes Unknown   . Bipolar disorder Unknown   . Cervical cancer Mother   . Breast cancer Neg Hx     Allergies  Allergen  Reactions  . Cephalosporins Anaphylaxis    Patient has tolerated meropenem.   [name] Penicillins Anaphylaxis and Other (See Comments)    Has patient had a PCN reaction causing immediate rash, facial/tongue/throat swelling, SOB or lightheadedness with hypotension: Yes Has patient had a PCN reaction causing severe rash involving mucus membranes or skin necrosis: No Has patient had a PCN reaction that required hospitalization No Has patient had a PCN reaction occurring within the last 10 years: No If all of the above answers are "NO", then may proceed with Cephalosporin use.  . Reglan [Metoclopramide] Other (See Comments)    Severe EPS (lip, head, body tremors) March 2018  . Risperdal [Risperidone] Other (See Comments)    Severe EPS (lip, head, body tremors) March 2018  . Lamictal [Lamotrigine] Other (See Comments)    Reaction:  Hallucinations  . Phenergan [Promethazine Hcl] Nausea And Vomiting  . Pravastatin Other (See Comments)    Reaction:  Muscle pain   . Sulfa Antibiotics Other (See Comments)    Reaction:  Unknown      REVIEW OF SYSTEMS (Negative unless checked)  Constitutional: [] Weight loss  [] Fever  [] Chills Cardiac: [] Chest pain   [] Chest pressure   [] Palpitations   [] Shortness of breath when laying flat   [] Shortness of breath with exertion. Vascular:  [] Pain in legs with walking   [] Pain in legs at rest  [] History of DVT   [] Phlebitis   [] Swelling in legs   [] Varicose veins   [] Non-healing ulcers Pulmonary:   [] Uses home oxygen   [] Productive cough   [] Hemoptysis   [] Wheeze  [] COPD   [] Asthma Neurologic:  [] Dizziness   [] Seizures   [] History of stroke   [] History of TIA  [] Aphasia   [] Vissual changes   [] Weakness or numbness in arm   [] Weakness or numbness in leg Musculoskeletal:   [] Joint swelling   [] Joint pain   [] Low back pain Hematologic:  [] Easy bruising  [] Easy bleeding   [] Hypercoagulable state   [] Anemic Gastrointestinal:  [] Diarrhea   [] Vomiting  [] Gastroesophageal  reflux/heartburn   [] Difficulty swallowing. Genitourinary:  [x] Chronic kidney disease   [] Difficult urination  [] Frequent urination   [] Blood in urine Skin:  [] Rashes   [] Ulcers  Psychological:  [] History of anxiety   []  History of major depression.  Physical Examination  Vitals:   08/15/16 0837  BP: 120/62  Pulse: (!) 56  Resp: 17  Weight: 220 lb (99.8 kg)  Height: 5\' 9"  (1.753 m)   Body mass index is 32.49 kg/m. Gen: WD/WN, NAD Head: [place]/AT, No temporalis wasting.  Ear/Nose/Throat: Hearing grossly intact, nares w/o erythema or drainage Eyes: PER, EOMI, sclera nonicteric.  Neck: Supple, no large masses.   Pulmonary:  Good air movement, no audible wheezing bilaterally, no use of accessory muscles.  Cardiac: RRR, no JVD Vascular:  Catheter CD&I Vessel Right Left  Radial Palpable Palpable  Ulnar Palpable Palpable  Brachial Palpable Palpable  Carotid Palpable Palpable  Gastrointestinal: Non-distended. No guarding/no peritoneal signs.  Musculoskeletal: M/S 5/5 throughout.  No deformity or atrophy.  Neurologic: CN 2-12 intact. Symmetrical.  Speech is fluent. Motor exam as listed above. Psychiatric: Judgment intact, Mood & affect appropriate for pt's clinical situation. Dermatologic: No rashes or ulcers noted.  No changes consistent with cellulitis. Lymph : No lichenification or skin changes of chronic lymphedema.  CBC Lab Results  Component Value Date   WBC 13.4 (H) 07/29/2016   HGB 11.9 (L) 07/29/2016   HCT 35.3 07/29/2016   MCV 88.1 07/29/2016   PLT 252 07/29/2016    BMET    Component Value Date/Time   NA 124 (L) 07/29/2016 1025   NA 136 (A) 03/27/2016   NA 136 07/17/2014 0417   K 6.6 (HH) 07/29/2016 1025   K 3.5 07/17/2014 0417   CL 86 (L) 07/29/2016 1025   CL 103 07/17/2014 0417   CO2 27 07/29/2016 1025   CO2 26 07/17/2014 0417   GLUCOSE 117 (H) 07/29/2016 1025   GLUCOSE 221 (H) 07/17/2014 0417   BUN 68 (H) 07/29/2016 1025   BUN 46 (A) 03/27/2016   BUN 32  (H) 07/17/2014 0417   CREATININE 5.52 (H) 07/29/2016 1025   CREATININE 3.45 (H) 07/17/2014 0417   CALCIUM 10.1 07/29/2016 1025   CALCIUM 8.4 (L) 07/17/2014 0417   GFRNONAA 8 (L) 07/29/2016 1025   GFRNONAA 15 (L) 07/17/2014 0417   GFRAA 9 (L) 07/29/2016 1025   GFRAA 17 (L) 07/17/2014 0417   Estimated Creatinine Clearance: 15 mL/min (A) (by C-G formula based on SCr of 5.52 mg/dL (H)).  COAG Lab Results  Component Value Date   INR 0.99 05/31/2016   INR 1.00 04/10/2016   INR 1.03 12/25/2015    Radiology Mm Digital Screening Bilateral  Result Date: 07/23/2016 CLINICAL DATA:  Screening. EXAM: DIGITAL SCREENING BILATERAL MAMMOGRAM WITH CAD COMPARISON:  None. ACR Breast Density Category c: The breast tissue is heterogeneously dense, which may obscure small masses FINDINGS: There are no findings suspicious for malignancy. Images were processed with CAD. IMPRESSION: No mammographic evidence of malignancy. A result letter of this screening mammogram will be mailed directly to the patient. RECOMMENDATION: Screening mammogram in one year. (Code:SM-B-01Y) BI-RADS CATEGORY  1: Negative. Electronically Signed   By: Ted Mcalpine M.D.   On: 07/23/2016 17:49    Assessment/Plan 1. ESRD on dialysis [facility]) Recommend:  At this time the patient does not have appropriate extremity access for dialysis  Patient should have a  Right brachial cephalic AV fistula created.  The risks, benefits and alternative therapies were reviewed in detail with the patient.  All questions were answered.  The patient agrees to proceed with surgery.    2. Type 2 diabetes mellitus with diabetic nephropathy, unspecified whether long term insulin use (HCC) Continue hypoglycemic medications as already ordered, these medications have been reviewed and there are no changes at this time.  Hgb  A1C to be monitored as already arranged by primary service   3. Coronary artery disease involving native coronary artery of native  heart without angina pectoris  She has been cleared for surgery by Dr Welton Flakes  Continue cardiac and antihypertensive medications as already ordered and reviewed, no changes at this time.  Continue statin as ordered and reviewed, no changes at this time  Nitrates PRN for chest pain   4. Accelerated hypertension Continue antihypertensive medications as already ordered, these medications have been reviewed and there are no changes at this time.   5. Seizure (HCC) Continue antiepileptic medications as already ordered, these medications have been reviewed and there are no changes at this time.   Levora Dredge, MD  08/18/2016 5:28 PM

## 2016-08-19 ENCOUNTER — Encounter
Admission: RE | Admit: 2016-08-19 | Discharge: 2016-08-19 | Disposition: A | Discharge status: Home / Self Care | Payer: Medicare HMO | Source: Ambulatory Visit | Attending: Vascular Surgery | Admitting: Vascular Surgery

## 2016-08-19 DIAGNOSIS — M545 Low back pain: Secondary | ICD-10-CM | POA: Insufficient documentation

## 2016-08-19 DIAGNOSIS — E1143 Type 2 diabetes mellitus with diabetic autonomic (poly)neuropathy: Secondary | ICD-10-CM | POA: Diagnosis not present

## 2016-08-19 DIAGNOSIS — I509 Heart failure, unspecified: Secondary | ICD-10-CM | POA: Diagnosis not present

## 2016-08-19 DIAGNOSIS — N289 Disorder of kidney and ureter, unspecified: Secondary | ICD-10-CM | POA: Insufficient documentation

## 2016-08-19 DIAGNOSIS — I252 Old myocardial infarction: Secondary | ICD-10-CM | POA: Diagnosis not present

## 2016-08-19 DIAGNOSIS — I25119 Atherosclerotic heart disease of native coronary artery with unspecified angina pectoris: Secondary | ICD-10-CM | POA: Diagnosis not present

## 2016-08-19 DIAGNOSIS — Z01812 Encounter for preprocedural laboratory examination: Secondary | ICD-10-CM | POA: Insufficient documentation

## 2016-08-19 DIAGNOSIS — I132 Hypertensive heart and chronic kidney disease with heart failure and with stage 5 chronic kidney disease, or end stage renal disease: Secondary | ICD-10-CM | POA: Insufficient documentation

## 2016-08-19 DIAGNOSIS — N186 End stage renal disease: Secondary | ICD-10-CM | POA: Insufficient documentation

## 2016-08-19 DIAGNOSIS — R569 Unspecified convulsions: Secondary | ICD-10-CM | POA: Insufficient documentation

## 2016-08-19 DIAGNOSIS — G8929 Other chronic pain: Secondary | ICD-10-CM | POA: Insufficient documentation

## 2016-08-19 DIAGNOSIS — E1122 Type 2 diabetes mellitus with diabetic chronic kidney disease: Secondary | ICD-10-CM | POA: Diagnosis not present

## 2016-08-19 DIAGNOSIS — Z01810 Encounter for preprocedural cardiovascular examination: Secondary | ICD-10-CM | POA: Diagnosis present

## 2016-08-19 DIAGNOSIS — K3184 Gastroparesis: Secondary | ICD-10-CM | POA: Insufficient documentation

## 2016-08-19 LAB — APTT: aPTT: 32 seconds (ref 24–36)

## 2016-08-19 LAB — CBC WITH DIFFERENTIAL/PLATELET
BASOS ABS: 0 10*3/uL (ref 0–0.1)
Basophils Relative: 1 %
Eosinophils Absolute: 0.2 10*3/uL (ref 0–0.7)
Eosinophils Relative: 3 %
HEMATOCRIT: 34 % — AB (ref 35.0–47.0)
Hemoglobin: 11.4 g/dL — ABNORMAL LOW (ref 12.0–16.0)
LYMPHS PCT: 15 %
Lymphs Abs: 1.2 10*3/uL (ref 1.0–3.6)
MCH: 29.7 pg (ref 26.0–34.0)
MCHC: 33.6 g/dL (ref 32.0–36.0)
MCV: 88.3 fL (ref 80.0–100.0)
MONO ABS: 0.8 10*3/uL (ref 0.2–0.9)
MONOS PCT: 10 %
NEUTROS ABS: 5.7 10*3/uL (ref 1.4–6.5)
Neutrophils Relative %: 71 %
Platelets: 379 10*3/uL (ref 150–440)
RBC: 3.85 MIL/uL (ref 3.80–5.20)
RDW: 15.4 % — ABNORMAL HIGH (ref 11.5–14.5)
WBC: 7.9 10*3/uL (ref 3.6–11.0)

## 2016-08-19 LAB — BASIC METABOLIC PANEL
ANION GAP: 9 (ref 5–15)
BUN: 28 mg/dL — ABNORMAL HIGH (ref 6–20)
CO2: 27 mmol/L (ref 22–32)
Calcium: 9.2 mg/dL (ref 8.9–10.3)
Chloride: 96 mmol/L — ABNORMAL LOW (ref 101–111)
Creatinine, Ser: 2.52 mg/dL — ABNORMAL HIGH (ref 0.44–1.00)
GFR calc Af Amer: 24 mL/min — ABNORMAL LOW (ref >60)
GFR calc non Af Amer: 21 mL/min — ABNORMAL LOW (ref >60)
GLUCOSE: 176 mg/dL — AB (ref 65–99)
POTASSIUM: 3.6 mmol/L (ref 3.5–5.1)
Sodium: 132 mmol/L — ABNORMAL LOW (ref 135–145)

## 2016-08-19 LAB — SURGICAL PCR SCREEN
MRSA, PCR: NEGATIVE
Staphylococcus aureus: NEGATIVE

## 2016-08-19 LAB — TYPE AND SCREEN
ABO/RH(D): A POS
Antibody Screen: NEGATIVE

## 2016-08-19 LAB — PROTIME-INR
INR: 0.97
Prothrombin Time: 12.9 seconds (ref 11.4–15.2)

## 2016-08-19 NOTE — Pre-Procedure Instructions (Signed)
Met B and CBC faxed to Dr. Nino Parsley office for review.

## 2016-08-19 NOTE — Patient Instructions (Signed)
  Your procedure is scheduled [id number] May25, 2018. Report to Same Day Surgery at 06:00 am..   Remember: Instructions that are not followed completely may result in serious medical risk, up to and including death, or upon the discretion of your surgeon and anesthesiologist your surgery may need to be rescheduled.    _x___ 1. Do not eat food or drink liquids after midnight. No gum chewing or hard candies.     _x___ 2. No Alcohol for 24 hours before or after surgery.   ____ 3. Bring all medications with you on the day of surgery if instructed.    __x__ 4. Notify your doctor if there is any change in your medical condition     (cold, fever, infections).    _____ 5. No smoking 24 hours prior to surgery.     Do not wear jewelry, make-up, hairpins, clips or nail polish.  Do not wear lotions, powders, or perfumes.   Do not shave 48 hours prior to surgery. Men may shave face and neck.  Do not bring valuables to the hospital.    [facility] is not responsible for any belongings or valuables.               Contacts, dentures or bridgework may not be worn into surgery.  Leave your suitcase in the car. After surgery it may be brought to your room.  For patients admitted to the hospital, discharge time is determined by your treatment team.   Patients discharged the day of surgery will not be allowed to drive home.    Please read over the following fact sheets that you were given:   [facility] Preparing for Surgery  __x_ Take these medicines the morning of surgery with A SIP OF WATER:    1. hydrALAZINE (APRESOLINE)  2. metoprolol (LOPRESSOR)  3. gabapentin (NEURONTIN)  4. omeprazole (PRILOSEC)   ____ Fleet Enema (as directed)   _x___ Use SAGE wipes as directed on instruction sheet the night prior to surgery and the morning of surgery.  ____ Use inhalers on the day of surgery and bring to hospital day of surgery  ____ Stop metformin 2 days prior to surgery    _x___ Take 1/2 of  usual insulin dose the night before surgery and none on the morning of  surgery.   ____ Stop Coumadin/Plavix/aspirin on as directed by Dr. Gilda Crease.  Do not stop either medication.  ____ Stop Anti-inflammatories such as Advil, Aleve, Ibuprofen, Motrin, Naproxen,  Naprosyn, Goodies powders or aspirin products. OK to take Tylenol.   __x__ Stop supplements: Melatonin until after surgery.    ____ Bring C-Pap to the hospital.

## 2016-08-22 MED ORDER — VANCOMYCIN HCL 10 G IV SOLR
1500.0000 mg | INTRAVENOUS | Status: AC
  Administered 2016-08-23: 1500 mg via INTRAVENOUS
  Filled 2016-08-22: qty 1500

## 2016-08-23 ENCOUNTER — Ambulatory Visit: Payer: Medicare HMO | Admitting: Certified Registered"

## 2016-08-23 ENCOUNTER — Encounter
Admission: RE | Disposition: A | Discharge status: Home / Self Care | Payer: Self-pay | Source: Ambulatory Visit | Attending: Vascular Surgery

## 2016-08-23 ENCOUNTER — Encounter: Payer: Self-pay | Admitting: *Deleted

## 2016-08-23 ENCOUNTER — Ambulatory Visit
Admission: RE | Admit: 2016-08-23 | Discharge: 2016-08-23 | Disposition: A | Discharge status: Home / Self Care | Payer: Medicare HMO | Source: Ambulatory Visit | Attending: Vascular Surgery | Admitting: Vascular Surgery

## 2016-08-23 DIAGNOSIS — I252 Old myocardial infarction: Secondary | ICD-10-CM | POA: Diagnosis not present

## 2016-08-23 DIAGNOSIS — D631 Anemia in chronic kidney disease: Secondary | ICD-10-CM | POA: Insufficient documentation

## 2016-08-23 DIAGNOSIS — K449 Diaphragmatic hernia without obstruction or gangrene: Secondary | ICD-10-CM | POA: Insufficient documentation

## 2016-08-23 DIAGNOSIS — I509 Heart failure, unspecified: Secondary | ICD-10-CM | POA: Diagnosis not present

## 2016-08-23 DIAGNOSIS — K219 Gastro-esophageal reflux disease without esophagitis: Secondary | ICD-10-CM | POA: Insufficient documentation

## 2016-08-23 DIAGNOSIS — K3184 Gastroparesis: Secondary | ICD-10-CM | POA: Diagnosis not present

## 2016-08-23 DIAGNOSIS — E1122 Type 2 diabetes mellitus with diabetic chronic kidney disease: Secondary | ICD-10-CM | POA: Insufficient documentation

## 2016-08-23 DIAGNOSIS — N186 End stage renal disease: Secondary | ICD-10-CM | POA: Diagnosis not present

## 2016-08-23 DIAGNOSIS — Z8249 Family history of ischemic heart disease and other diseases of the circulatory system: Secondary | ICD-10-CM | POA: Diagnosis not present

## 2016-08-23 DIAGNOSIS — F419 Anxiety disorder, unspecified: Secondary | ICD-10-CM | POA: Diagnosis not present

## 2016-08-23 DIAGNOSIS — F319 Bipolar disorder, unspecified: Secondary | ICD-10-CM | POA: Insufficient documentation

## 2016-08-23 DIAGNOSIS — Z880 Allergy status to penicillin: Secondary | ICD-10-CM | POA: Insufficient documentation

## 2016-08-23 DIAGNOSIS — Z90710 Acquired absence of both cervix and uterus: Secondary | ICD-10-CM | POA: Diagnosis not present

## 2016-08-23 DIAGNOSIS — Z992 Dependence on renal dialysis: Secondary | ICD-10-CM | POA: Diagnosis not present

## 2016-08-23 DIAGNOSIS — F329 Major depressive disorder, single episode, unspecified: Secondary | ICD-10-CM | POA: Diagnosis not present

## 2016-08-23 DIAGNOSIS — M545 Low back pain: Secondary | ICD-10-CM | POA: Insufficient documentation

## 2016-08-23 DIAGNOSIS — Z818 Family history of other mental and behavioral disorders: Secondary | ICD-10-CM | POA: Diagnosis not present

## 2016-08-23 DIAGNOSIS — Z8049 Family history of malignant neoplasm of other genital organs: Secondary | ICD-10-CM | POA: Diagnosis not present

## 2016-08-23 DIAGNOSIS — Z888 Allergy status to other drugs, medicaments and biological substances status: Secondary | ICD-10-CM | POA: Insufficient documentation

## 2016-08-23 DIAGNOSIS — E785 Hyperlipidemia, unspecified: Secondary | ICD-10-CM | POA: Insufficient documentation

## 2016-08-23 DIAGNOSIS — G43909 Migraine, unspecified, not intractable, without status migrainosus: Secondary | ICD-10-CM | POA: Diagnosis not present

## 2016-08-23 DIAGNOSIS — G8929 Other chronic pain: Secondary | ICD-10-CM | POA: Diagnosis not present

## 2016-08-23 DIAGNOSIS — Z833 Family history of diabetes mellitus: Secondary | ICD-10-CM | POA: Insufficient documentation

## 2016-08-23 DIAGNOSIS — Z87891 Personal history of nicotine dependence: Secondary | ICD-10-CM | POA: Insufficient documentation

## 2016-08-23 DIAGNOSIS — Z882 Allergy status to sulfonamides status: Secondary | ICD-10-CM | POA: Insufficient documentation

## 2016-08-23 DIAGNOSIS — Z89511 Acquired absence of right leg below knee: Secondary | ICD-10-CM | POA: Insufficient documentation

## 2016-08-23 DIAGNOSIS — I132 Hypertensive heart and chronic kidney disease with heart failure and with stage 5 chronic kidney disease, or end stage renal disease: Secondary | ICD-10-CM | POA: Insufficient documentation

## 2016-08-23 DIAGNOSIS — I25119 Atherosclerotic heart disease of native coronary artery with unspecified angina pectoris: Secondary | ICD-10-CM | POA: Insufficient documentation

## 2016-08-23 HISTORY — PX: AV FISTULA PLACEMENT: SHX1204

## 2016-08-23 LAB — GLUCOSE, CAPILLARY
GLUCOSE-CAPILLARY: 154 mg/dL — AB (ref 65–99)
GLUCOSE-CAPILLARY: 150 mg/dL — AB (ref 65–99)

## 2016-08-23 LAB — POCT I-STAT 4, (NA,K, GLUC, HGB,HCT)
Glucose, Bld: 141 mg/dL — ABNORMAL HIGH (ref 65–99)
HEMATOCRIT: 33 % — AB (ref 36.0–46.0)
Hemoglobin: 11.2 g/dL — ABNORMAL LOW (ref 12.0–15.0)
POTASSIUM: 4.1 mmol/L (ref 3.5–5.1)
SODIUM: 134 mmol/L — AB (ref 135–145)

## 2016-08-23 SURGERY — ARTERIOVENOUS (AV) FISTULA CREATION
Anesthesia: General | Laterality: Right

## 2016-08-23 MED ORDER — ETOMIDATE 2 MG/ML IV SOLN
INTRAVENOUS | Status: DC | PRN
  Administered 2016-08-23: 20 mg via INTRAVENOUS

## 2016-08-23 MED ORDER — PAPAVERINE HCL 30 MG/ML IJ SOLN
INTRAMUSCULAR | Status: AC
  Filled 2016-08-23: qty 2

## 2016-08-23 MED ORDER — MIDAZOLAM HCL 2 MG/2ML IJ SOLN
INTRAMUSCULAR | Status: AC
  Filled 2016-08-23: qty 2

## 2016-08-23 MED ORDER — HEPARIN SODIUM (PORCINE) 5000 UNIT/ML IJ SOLN
INTRAMUSCULAR | Status: AC
  Filled 2016-08-23: qty 1

## 2016-08-23 MED ORDER — HYDROCODONE-ACETAMINOPHEN 5-325 MG PO TABS
ORAL_TABLET | ORAL | Status: AC
  Filled 2016-08-23: qty 1

## 2016-08-23 MED ORDER — PROPOFOL 10 MG/ML IV BOLUS
INTRAVENOUS | Status: AC
  Filled 2016-08-23: qty 20

## 2016-08-23 MED ORDER — BUPIVACAINE HCL (PF) 0.5 % IJ SOLN
INTRAMUSCULAR | Status: AC
  Filled 2016-08-23: qty 30

## 2016-08-23 MED ORDER — FENTANYL CITRATE (PF) 100 MCG/2ML IJ SOLN
INTRAMUSCULAR | Status: DC | PRN
  Administered 2016-08-23 (×2): 50 ug via INTRAVENOUS

## 2016-08-23 MED ORDER — CHLORHEXIDINE GLUCONATE CLOTH 2 % EX PADS: 6.0000 | MEDICATED_PAD | Freq: Once | CUTANEOUS | Status: DC

## 2016-08-23 MED ORDER — FENTANYL CITRATE (PF) 100 MCG/2ML IJ SOLN
INTRAMUSCULAR | Status: AC
  Filled 2016-08-23: qty 2

## 2016-08-23 MED ORDER — FAMOTIDINE 20 MG PO TABS
20.0000 mg | ORAL_TABLET | Freq: Once | ORAL | Status: AC
  Administered 2016-08-23: 20 mg via ORAL

## 2016-08-23 MED ORDER — ONDANSETRON HCL 4 MG/2ML IJ SOLN
INTRAMUSCULAR | Status: AC
  Filled 2016-08-23: qty 2

## 2016-08-23 MED ORDER — HYDROCODONE-ACETAMINOPHEN 5-325 MG PO TABS: 1.0000 | ORAL_TABLET | Freq: Four times a day (QID) | ORAL | 0 refills | Status: DC | PRN

## 2016-08-23 MED ORDER — BUPIVACAINE HCL (PF) 0.5 % IJ SOLN
INTRAMUSCULAR | Status: DC | PRN
  Administered 2016-08-23: 20 mL

## 2016-08-23 MED ORDER — MIDAZOLAM HCL 2 MG/2ML IJ SOLN
INTRAMUSCULAR | Status: DC | PRN
  Administered 2016-08-23: 1 mg via INTRAVENOUS

## 2016-08-23 MED ORDER — SODIUM CHLORIDE 0.9 % IV SOLN
INTRAVENOUS | Status: DC
  Administered 2016-08-23: 25 mL/h via INTRAVENOUS

## 2016-08-23 MED ORDER — PHENYLEPHRINE HCL 10 MG/ML IJ SOLN
INTRAMUSCULAR | Status: AC
  Filled 2016-08-23: qty 1

## 2016-08-23 MED ORDER — METOPROLOL TARTRATE 50 MG PO TABS
50.0000 mg | ORAL_TABLET | Freq: Once | ORAL | Status: AC
  Administered 2016-08-23: 50 mg via ORAL

## 2016-08-23 MED ORDER — FENTANYL CITRATE (PF) 100 MCG/2ML IJ SOLN
25.0000 ug | INTRAMUSCULAR | Status: DC | PRN
  Administered 2016-08-23 (×2): 25 ug via INTRAVENOUS

## 2016-08-23 MED ORDER — METOPROLOL TARTRATE 50 MG PO TABS
ORAL_TABLET | ORAL | Status: AC
  Administered 2016-08-23: 50 mg via ORAL
  Filled 2016-08-23: qty 1

## 2016-08-23 MED ORDER — FAMOTIDINE 20 MG PO TABS
ORAL_TABLET | ORAL | Status: AC
  Administered 2016-08-23: 20 mg via ORAL
  Filled 2016-08-23: qty 1

## 2016-08-23 MED ORDER — ONDANSETRON HCL 4 MG/2ML IJ SOLN
INTRAMUSCULAR | Status: DC | PRN
  Administered 2016-08-23: 4 mg via INTRAVENOUS

## 2016-08-23 MED ORDER — EPHEDRINE SULFATE 50 MG/ML IJ SOLN
INTRAMUSCULAR | Status: DC | PRN
Start: 2016-08-23 — End: 2016-08-23
  Administered 2016-08-23 (×3): 5 mg via INTRAVENOUS

## 2016-08-23 MED ORDER — FENTANYL CITRATE (PF) 100 MCG/2ML IJ SOLN
INTRAMUSCULAR | Status: AC
  Administered 2016-08-23: 25 ug via INTRAVENOUS
  Filled 2016-08-23: qty 2

## 2016-08-23 MED ORDER — ONDANSETRON HCL 4 MG/2ML IJ SOLN: 4.0000 mg | Freq: Once | INTRAMUSCULAR | Status: DC | PRN

## 2016-08-23 MED ORDER — HYDROCODONE-ACETAMINOPHEN 5-325 MG PO TABS
1.0000 | ORAL_TABLET | Freq: Four times a day (QID) | ORAL | Status: DC | PRN
  Administered 2016-08-23: 1 via ORAL

## 2016-08-23 SURGICAL SUPPLY — 55 items
APPLIER CLIP 11 MED OPEN (CLIP)
APPLIER CLIP 9.375 SM OPEN (CLIP)
BAG DECANTER FOR FLEXI CONT (MISCELLANEOUS) ×3 IMPLANT
BLADE SURG SZ11 CARB STEEL (BLADE) ×3 IMPLANT
BOOT SUTURE AID YELLOW STND (SUTURE) ×3 IMPLANT
BRUSH SCRUB 4% CHG (MISCELLANEOUS) ×3 IMPLANT
CANISTER SUCT 1200ML W/VALVE (MISCELLANEOUS) ×3 IMPLANT
CHLORAPREP W/TINT 26ML (MISCELLANEOUS) ×3 IMPLANT
CLIP APPLIE 11 MED OPEN (CLIP) IMPLANT
CLIP APPLIE 9.375 SM OPEN (CLIP) IMPLANT
DERMABOND ADVANCED (GAUZE/BANDAGES/DRESSINGS) ×2
DERMABOND ADVANCED .7 DNX12 (GAUZE/BANDAGES/DRESSINGS) ×1 IMPLANT
DRESSING SURGICEL FIBRLLR 1X2 (HEMOSTASIS) ×1 IMPLANT
DRSG SURGICEL FIBRILLAR 1X2 (HEMOSTASIS) ×3
ELECT CAUTERY BLADE 6.4 (BLADE) ×3 IMPLANT
ELECT REM PT RETURN 9FT ADLT (ELECTROSURGICAL) ×3
ELECTRODE REM PT RTRN 9FT ADLT (ELECTROSURGICAL) ×1 IMPLANT
GEL ULTRASOUND 20GR AQUASONIC (MISCELLANEOUS) IMPLANT
GLOVE BIO SURGEON STRL SZ7 (GLOVE) ×12 IMPLANT
GLOVE INDICATOR 7.5 STRL GRN (GLOVE) ×12 IMPLANT
GLOVE SURG SYN 8.0 (GLOVE) ×3 IMPLANT
GOWN STRL REUS W/ TWL LRG LVL3 (GOWN DISPOSABLE) ×2 IMPLANT
GOWN STRL REUS W/ TWL XL LVL3 (GOWN DISPOSABLE) ×1 IMPLANT
GOWN STRL REUS W/TWL LRG LVL3 (GOWN DISPOSABLE) ×4
GOWN STRL REUS W/TWL XL LVL3 (GOWN DISPOSABLE) ×2
IV NS 500ML (IV SOLUTION) ×2
IV NS 500ML BAXH (IV SOLUTION) ×1 IMPLANT
KIT RM TURNOVER STRD PROC AR (KITS) ×3 IMPLANT
LABEL OR SOLS (LABEL) ×3 IMPLANT
LOOP RED MAXI  1X406MM (MISCELLANEOUS) ×2
LOOP VESSEL MAXI 1X406 RED (MISCELLANEOUS) ×1 IMPLANT
LOOP VESSEL MINI 0.8X406 BLUE (MISCELLANEOUS) ×1 IMPLANT
LOOPS BLUE MINI 0.8X406MM (MISCELLANEOUS) ×2
NEEDLE FILTER BLUNT 18X 1/2SAF (NEEDLE) ×2
NEEDLE FILTER BLUNT 18X1 1/2 (NEEDLE) ×1 IMPLANT
NEEDLE HYPO 30X.5 LL (NEEDLE) IMPLANT
NS IRRIG 500ML POUR BTL (IV SOLUTION) ×3 IMPLANT
PACK EXTREMITY ARMC (MISCELLANEOUS) ×3 IMPLANT
PAD PREP 24X41 OB/GYN DISP (PERSONAL CARE ITEMS) ×3 IMPLANT
PUNCH SURGICAL ROTATE 2.7MM (MISCELLANEOUS) IMPLANT
STOCKINETTE STRL 4IN 9604848 (GAUZE/BANDAGES/DRESSINGS) ×3 IMPLANT
SUT MNCRL+ 5-0 UNDYED PC-3 (SUTURE) ×1 IMPLANT
SUT MONOCRYL 5-0 (SUTURE) ×2
SUT PROLENE 6 0 BV (SUTURE) ×12 IMPLANT
SUT SILK 2 0 (SUTURE) ×2
SUT SILK 2-0 18XBRD TIE 12 (SUTURE) ×1 IMPLANT
SUT SILK 3 0 (SUTURE) ×2
SUT SILK 3-0 18XBRD TIE 12 (SUTURE) ×1 IMPLANT
SUT SILK 4 0 (SUTURE) ×2
SUT SILK 4-0 18XBRD TIE 12 (SUTURE) ×1 IMPLANT
SUT VIC AB 3-0 SH 27 (SUTURE) ×2
SUT VIC AB 3-0 SH 27X BRD (SUTURE) ×1 IMPLANT
SYR 20CC LL (SYRINGE) ×3 IMPLANT
SYR 3ML LL SCALE MARK (SYRINGE) ×3 IMPLANT
TOWEL OR 17X26 4PK STRL BLUE (TOWEL DISPOSABLE) IMPLANT

## 2016-08-23 NOTE — Anesthesia Procedure Notes (Signed)
Procedure Name: LMA Insertion Performed by: Jamieson Lisa Pre-anesthesia Checklist: Patient identified, Patient being monitored, Timeout performed, Emergency Drugs available and Suction available Patient Re-evaluated:Patient Re-evaluated prior to inductionOxygen Delivery Method: Circle system utilized Preoxygenation: Pre-oxygenation with 100% oxygen Intubation Type: IV induction LMA: LMA inserted LMA Size: 3.5 Tube type: Oral Number of attempts: 1 Placement Confirmation: positive ETCO2 and breath sounds checked- equal and bilateral Tube secured with: Tape Dental Injury: Teeth and Oropharynx as per pre-operative assessment        

## 2016-08-23 NOTE — Anesthesia Post-op Follow-up Note (Cosign Needed)
Anesthesia QCDR form completed.        

## 2016-08-23 NOTE — Transfer of Care (Signed)
Immediate Anesthesia Transfer of Care Note  Patient: Kimberly Montgomery  Procedure(s) Performed: Procedure(s): ARTERIOVENOUS (AV) FISTULA CREATION  ( BRACHIAL CEPHALIC ) (Right)  Patient Location: PACU  Anesthesia Type:General  Level of Consciousness: sedated and responds to stimulation  Airway & Oxygen Therapy: Patient Spontanous Breathing and Patient connected to face mask oxygen  Post-op Assessment: Report given to RN and Post -op Vital signs reviewed and stable  Post vital signs: Reviewed and stable  Last Vitals:  Vitals:   08/23/16 0618 08/23/16 0946  BP: 138/81 (!) 162/54  Pulse: 63 66  Resp: 15 11  Temp: 36.3 C     Last Pain:  Vitals:   08/23/16 0618  TempSrc: Tympanic  PainSc: 0-No pain      Patients Stated Pain Goal: 0 (08/23/16 0618)  Complications: No apparent anesthesia complications

## 2016-08-23 NOTE — Anesthesia Postprocedure Evaluation (Signed)
Anesthesia Post Note  Patient: ALANI FINKE  Procedure(s) Performed: Procedure(s) (LRB): ARTERIOVENOUS (AV) FISTULA CREATION  ( BRACHIAL CEPHALIC ) (Right)  Patient location during evaluation: PACU Anesthesia Type: General Level of consciousness: awake and alert Pain management: pain level controlled Vital Signs Assessment: post-procedure vital signs reviewed and stable Respiratory status: spontaneous breathing, nonlabored ventilation, respiratory function stable and patient connected to nasal cannula oxygen Cardiovascular status: blood pressure returned to baseline and stable Postop Assessment: no signs of nausea or vomiting Anesthetic complications: no     Last Vitals:  Vitals:   08/23/16 1028 08/23/16 1037  BP: (!) 141/51 (!) 142/56  Pulse: 64 (!) 59  Resp: 11 16  Temp: 36.3 C 36.4 C    Last Pain:  Vitals:   08/23/16 1037  TempSrc: Oral  PainSc: 4                  Yevette Edwards

## 2016-08-23 NOTE — Discharge Instructions (Signed)

## 2016-08-23 NOTE — Anesthesia Preprocedure Evaluation (Signed)
Anesthesia Evaluation  Patient identified by MRN, date of birth, ID band Patient awake    Reviewed: Allergy & Precautions, H&P , NPO status , Patient's Chart, lab work & pertinent test results, reviewed documented beta blocker date and time   Airway Mallampati: IV  TM Distance: >3 FB Neck ROM: full    Dental  (+) Teeth Intact   Pulmonary neg pulmonary ROS, neg shortness of breath, former smoker,    Pulmonary exam normal        Cardiovascular Exercise Tolerance: Poor hypertension, + angina with exertion + CAD, + Past MI and +CHF  negative cardio ROS Normal cardiovascular exam Rate:Normal     Neuro/Psych  Headaches, Seizures -, Well Controlled,  PSYCHIATRIC DISORDERS negative neurological ROS  negative psych ROS   GI/Hepatic negative GI ROS, Neg liver ROS, hiatal hernia, GERD  Medicated,  Endo/Other  negative endocrine ROSdiabetes  Renal/GU DialysisRenal diseasenegative Renal ROS  negative genitourinary   Musculoskeletal   Abdominal   Peds  Hematology negative hematology ROS (+) anemia ,   Anesthesia Other Findings   Reproductive/Obstetrics negative OB ROS                             Anesthesia Physical Anesthesia Plan  ASA: IV  Anesthesia Plan: General LMA   Post-op Pain Management:    Induction:   Airway Management Planned:   Additional Equipment:   Intra-op Plan:   Post-operative Plan:   Informed Consent: I have reviewed the patients History and Physical, chart, labs and discussed the procedure including the risks, benefits and alternatives for the proposed anesthesia with the patient or authorized representative who has indicated his/her understanding and acceptance.     Plan Discussed with: CRNA  Anesthesia Plan Comments: (Pt is at high risk for any anesthesia as discussed with her today.  Will plan LMA.  Coverage with [facility] would be difficult and concerns for phrenic  nerve block possible with hx of sleep apnea.[name] JA)       Anesthesia Quick Evaluation

## 2016-08-23 NOTE — Op Note (Signed)
     OPERATIVE NOTE   PROCEDURE: right brachial cephalic arteriovenous fistula placement  PRE-OPERATIVE DIAGNOSIS: End Stage Renal Disease  POST-OPERATIVE DIAGNOSIS: End Stage Renal Disease  SURGEON: Levora Dredge  ASSISTANT(S): none  ANESTHESIA: general  ESTIMATED BLOOD LOSS: <50 cc  FINDING(S): 4.0 mm cephalic vein  SPECIMEN(S):  none  INDICATIONS:   Kimberly Montgomery is a 51 y.o. female who presents with end stage renal disease.  The patient is scheduled for right brachiocephalic arteriovenous fistula placement.  The patient is aware the risks include but are not limited to: bleeding, infection, steal syndrome, nerve damage, ischemic monomelic neuropathy, failure to mature, and need for additional procedures.  The patient is aware of the risks of the procedure and elects to proceed forward.  DESCRIPTION: After full informed written consent was obtained from the patient, the patient was brought back to the operating room and placed supine upon the operating table.  Prior to induction, the patient received IV antibiotics.   After obtaining adequate anesthesia, the patient was then prepped and draped in the standard fashion for a right arm access procedure.    A curvilinear incision was then created midway between the radial impulse and the cephalic vein. The cephalic vein was then identified and dissected circumferentially. It was marked with a surgical marker.    Attention was then turned to the brachial artery which was exposed through the same incision and looped proximally and distally. Side branches were controlled with 4-0 silk ties.  The distal segment of the vein was ligated with a  2-0 silk, and the vein was transected.  The proximal segment was interrogated with serial dilators.  The vein accepted up to a 4.0 mm dilator without any difficulty. Heparinized saline was infused into the vein and clamped it with a small bulldog.  At this point, I reset my exposure of the  brachial artery and controlled the artery with vessel loops proximally and distally.  An arteriotomy was then made with a #11 blade, and extended with a Potts scissor.  Heparinized saline was injected proximal and distal into the radial artery.  The vein was then approximated to the artery while the artery was in its native bed and subsequently the vein was beveled using Potts scissors. The vein was then sewn to the artery in an end-to-side configuration with a running stitch of 6-0 Prolene.  Prior to completing this anastomosis Flushing maneuvers were performed and the artery was allowed to forward and back bleed.  There was no evidence of clot from any vessels.  I completed the anastomosis in the usual fashion and then released all vessel loops and clamps.    There was good  thrill in the venous outflow, and there was 1+ palpable radial pulse.  At this point, I irrigated out the surgical wound.  There was no further active bleeding.  The subcutaneous tissue was reapproximated with a running stitch of 3-0 Vicryl.  The skin was then reapproximated with a running subcuticular stitch of 4-0 Vicryl.  The skin was then cleaned, dried, and reinforced with Dermabond.    The patient tolerated this procedure well.   COMPLICATIONS: None  CONDITION: [name]  Office: [phone]   08/23/2016, 10:01 AM

## 2016-08-24 ENCOUNTER — Encounter: Payer: Self-pay | Admitting: Vascular Surgery

## 2016-10-01 ENCOUNTER — Other Ambulatory Visit (INDEPENDENT_AMBULATORY_CARE_PROVIDER_SITE_OTHER): Payer: Self-pay | Admitting: Vascular Surgery

## 2016-10-01 DIAGNOSIS — N186 End stage renal disease: Secondary | ICD-10-CM

## 2016-10-03 ENCOUNTER — Ambulatory Visit (INDEPENDENT_AMBULATORY_CARE_PROVIDER_SITE_OTHER): Payer: Medicare HMO

## 2016-10-03 ENCOUNTER — Encounter (INDEPENDENT_AMBULATORY_CARE_PROVIDER_SITE_OTHER): Payer: Self-pay | Admitting: Vascular Surgery

## 2016-10-03 ENCOUNTER — Ambulatory Visit (INDEPENDENT_AMBULATORY_CARE_PROVIDER_SITE_OTHER): Payer: Medicare HMO | Admitting: Vascular Surgery

## 2016-10-03 VITALS — BP 94/62 | HR 56 | Resp 16

## 2016-10-03 DIAGNOSIS — N186 End stage renal disease: Secondary | ICD-10-CM

## 2016-10-03 DIAGNOSIS — I10 Essential (primary) hypertension: Secondary | ICD-10-CM

## 2016-10-03 DIAGNOSIS — I2510 Atherosclerotic heart disease of native coronary artery without angina pectoris: Secondary | ICD-10-CM

## 2016-10-03 DIAGNOSIS — Z992 Dependence on renal dialysis: Secondary | ICD-10-CM

## 2016-10-03 DIAGNOSIS — T829XXS Unspecified complication of cardiac and vascular prosthetic device, implant and graft, sequela: Secondary | ICD-10-CM

## 2016-10-03 NOTE — Progress Notes (Signed)
MRN : [id number]  Kimberly Montgomery is a 51 y.o. (28-Jun-1965) female who presents with chief complaint of  Chief Complaint  Patient presents with  . Re-evaluation  .  History of Present Illness: The patient returns to the office for followup of their dialysis access, a right brachial cephalic arteriovenous fistula placement was created 08/23/2016.   The function of the access has been stable. The patient denies hand pain or other symptoms consistent with steal phenomena.  No significant arm swelling.  The patient denies redness or swelling at the access site. The patient denies fever or chills at home or while on dialysis.  The patient denies amaurosis fugax or recent TIA symptoms. There are no recent neurological changes noted. The patient denies claudication symptoms or rest pain symptoms. The patient denies history of DVT, PE or superficial thrombophlebitis. The patient denies recent episodes of angina or shortness of breath.       No outpatient prescriptions have been marked as taking for the 10/03/16 encounter (Office Visit) with Gilda Crease, Latina Craver, MD.    Past Medical History:  Diagnosis Date  . Anginal pain (HCC)   . Anxiety   . Bipolar disorder (HCC)   . CAD (coronary artery disease)   . CHF (congestive heart failure) (HCC)   . Chronic lower back pain   . Depression   . Endometriosis   . ESRD (end stage renal disease) on dialysis (HCC)    "[facility]; [address]; [place]; TTS" (01/19/2016)  . Gastroparesis   . GERD (gastroesophageal reflux disease)   . History of blood transfusion "several"   "my blood would get low; low RBC"  . History of hiatal hernia   . HLD (hyperlipidemia)   . Hypertension   . Migraine    "monthly" (01/19/2016)  . Myocardial infarction (HCC) 2017   "~ 3 wks ago" (01/19/2016)  . Renal disorder   . Renal insufficiency   . Seizures (HCC) 07/2015   "I've only had the 1; don't know what from" (01/19/2016)  . Type II diabetes mellitus (HCC)      Past Surgical History:  Procedure Laterality Date  . ABDOMINAL HYSTERECTOMY     "partial"  . AV FISTULA PLACEMENT Right 08/23/2016   Procedure: ARTERIOVENOUS (AV) FISTULA CREATION  ( BRACHIAL CEPHALIC );  Surgeon: Renford Dills, MD;  Location: [facility] ORS;  Service: Vascular;  Laterality: Right;  . BELOW KNEE LEG AMPUTATION Right 2010?  [name] CARDIAC CATHETERIZATION Right 07/10/2015   Procedure: Left Heart Cath and Coronary Angiography;  Surgeon: Laurier Nancy, MD;  Location: [facility] INVASIVE CV LAB;  Service: Cardiovascular;  Laterality: Right;  . CARDIAC CATHETERIZATION Right 09/14/2015   Procedure: Left Heart Cath and Coronary Angiography;  Surgeon: Laurier Nancy, MD;  Location: [facility] INVASIVE CV LAB;  Service: Cardiovascular;  Laterality: Right;  . CARDIAC CATHETERIZATION N/A 09/14/2015   Procedure: Coronary Stent Intervention;  Surgeon: Alwyn Pea, MD;  Location: [facility] INVASIVE CV LAB;  Service: Cardiovascular;  Laterality: N/A;  . CARDIAC CATHETERIZATION Right 11/20/2015   Procedure: Left Heart Cath and Coronary Angiography;  Surgeon: Laurier Nancy, MD;  Location: [facility] INVASIVE CV LAB;  Service: Cardiovascular;  Laterality: Right;  . CATARACT EXTRACTION, BILATERAL    . CORONARY ANGIOPLASTY WITH STENT PLACEMENT  <2017   @ [facility]/notes 07/03/2013  . CORONARY ANGIOPLASTY WITH STENT PLACEMENT    . HYSTEROTOMY    . PERIPHERAL VASCULAR CATHETERIZATION N/A 08/30/2015   Procedure: Dialysis/Perma Catheter Insertion;  Surgeon: Annice Needy,  MD;  Location: [facility] INVASIVE CV LAB;  Service: Cardiovascular;  Laterality: N/A;  . PERIPHERAL VASCULAR CATHETERIZATION N/A 01/01/2016   Procedure: Dialysis/Perma Catheter Insertion;  Surgeon: Annice Needy, MD;  Location: [facility] INVASIVE CV LAB;  Service: Cardiovascular;  Laterality: N/A;  . PERITONEAL CATHETER INSERTION  11/03/2013   [name] 11/03/2013  . PERITONEAL CATHETER REMOVAL  01/01/2016   "took the one from May out; put new PD cath in" (01/19/2016)  .  PERITONEAL CATHETER REMOVAL  11/03/2013; 02/11/2014   [name] 11/03/2013; Removal of tunneled catheter/notes 02/11/2014  . SALPINGOOPHORECTOMY Right    [name] 07/03/2013  . TEE WITHOUT CARDIOVERSION N/A 06/07/2016   Procedure: TRANSESOPHAGEAL ECHOCARDIOGRAM (TEE);  Surgeon: Chrystie Nose, MD;  Location: [facility] ENDOSCOPY;  Service: Cardiovascular;  Laterality: N/A;  . TUBAL LIGATION      Social History Social History  Substance Use Topics  . Smoking status: Former Smoker    Packs/day: 0.75    Types: Cigarettes    Quit date: 07/01/2015  . Smokeless tobacco: Never Used  . Alcohol use 0.0 oz/week     Comment: 01/19/2016 "might have a couple drinks/year"    Family History Family History  Problem Relation Age of Onset  . CAD Unknown   . Diabetes Unknown   . Bipolar disorder Unknown   . Cervical cancer Mother   . Breast cancer Neg Hx     Allergies  Allergen Reactions  . Cephalosporins Anaphylaxis    Patient has tolerated meropenem.   [name] Penicillins Anaphylaxis and Other (See Comments)    Has patient had a PCN reaction causing immediate rash, facial/tongue/throat swelling, SOB or lightheadedness with hypotension: Yes Has patient had a PCN reaction causing severe rash involving mucus membranes or skin necrosis: No Has patient had a PCN reaction that required hospitalization No Has patient had a PCN reaction occurring within the last 10 years: No If all of the above answers are "NO", then may proceed with Cephalosporin use.  . Reglan [Metoclopramide] Other (See Comments)    Severe EPS (lip, head, body tremors) March 2018  . Risperdal [Risperidone] Other (See Comments)    Severe EPS (lip, head, body tremors) March 2018  . Lamictal [Lamotrigine] Other (See Comments)    Reaction:  Hallucinations  . Phenergan [Promethazine Hcl] Nausea And Vomiting    Sensitivity to medicine  . Pravastatin Other (See Comments)    Reaction:  Muscle pain   . Sulfa Antibiotics Other (See Comments)    Reaction:   Unknown      REVIEW OF SYSTEMS (Negative unless checked)  Constitutional: [] Weight loss  [] Fever  [] Chills Cardiac: [] Chest pain   [] Chest pressure   [] Palpitations   [] Shortness of breath when laying flat   [] Shortness of breath with exertion. Vascular:  [] Pain in legs with walking   [] Pain in legs at rest  [] History of DVT   [] Phlebitis   [] Swelling in legs   [] Varicose veins   [] Non-healing ulcers Pulmonary:   [] Uses home oxygen   [] Productive cough   [] Hemoptysis   [] Wheeze  [] COPD   [] Asthma Neurologic:  [] Dizziness   [] Seizures   [] History of stroke   [] History of TIA  [] Aphasia   [] Vissual changes   [] Weakness or numbness in arm   [] Weakness or numbness in leg Musculoskeletal:   [] Joint swelling   [] Joint pain   [] Low back pain Hematologic:  [] Easy bruising  [] Easy bleeding   [] Hypercoagulable state   [] Anemic Gastrointestinal:  [] Diarrhea   [] Vomiting  [] Gastroesophageal reflux/heartburn   []   Difficulty swallowing. Genitourinary:  [] Chronic kidney disease   [] Difficult urination  [] Frequent urination   [] Blood in urine Skin:  [] Rashes   [] Ulcers  Psychological:  [] History of anxiety   []  History of major depression.  Physical Examination  Vitals:   10/03/16 0926  BP: 94/62  Pulse: (!) 56  Resp: 16   There is no height or weight on file to calculate BMI. Gen: WD/WN, NAD Head: [place]/AT, No temporalis wasting.  Ear/Nose/Throat: Hearing grossly intact, nares w/o erythema or drainage Eyes: PER, EOMI, sclera nonicteric.  Neck: Supple, no large masses.   Pulmonary:  Good air movement, no audible wheezing bilaterally, no use of accessory muscles.  Cardiac: RRR, no JVD Vascular: right brachial cephalic fistula with good thrill and good bruit incision CD&I Vessel Right Left  Radial Trace Palpable Palpable  Ulnar Palpable Palpable  Brachial Palpable Palpable  Gastrointestinal: Non-distended. No guarding/no peritoneal signs.  Musculoskeletal: M/S 5/5 throughout.  No deformity or  atrophy.  Neurologic: CN 2-12 intact. Symmetrical.  Speech is fluent. Motor exam as listed above. Psychiatric: Judgment intact, Mood & affect appropriate for pt's clinical situation. Dermatologic: No rashes or ulcers noted.  No changes consistent with cellulitis. Lymph : No lichenification or skin changes of chronic lymphedema.  CBC Lab Results  Component Value Date   WBC 7.9 08/19/2016   HGB 11.2 (L) 08/23/2016   HCT 33.0 (L) 08/23/2016   MCV 88.3 08/19/2016   PLT 379 08/19/2016    BMET    Component Value Date/Time   NA 134 (L) 08/23/2016 0654   NA 136 (A) 03/27/2016   NA 136 07/17/2014 0417   K 4.1 08/23/2016 0654   K 3.5 07/17/2014 0417   CL 96 (L) 08/19/2016 1308   CL 103 07/17/2014 0417   CO2 27 08/19/2016 1308   CO2 26 07/17/2014 0417   GLUCOSE 141 (H) 08/23/2016 0654   GLUCOSE 221 (H) 07/17/2014 0417   BUN 28 (H) 08/19/2016 1308   BUN 46 (A) 03/27/2016   BUN 32 (H) 07/17/2014 0417   CREATININE 2.52 (H) 08/19/2016 1308   CREATININE 3.45 (H) 07/17/2014 0417   CALCIUM 9.2 08/19/2016 1308   CALCIUM 8.4 (L) 07/17/2014 0417   GFRNONAA 21 (L) 08/19/2016 1308   GFRNONAA 15 (L) 07/17/2014 0417   GFRAA 24 (L) 08/19/2016 1308   GFRAA 17 (L) 07/17/2014 0417   CrCl cannot be calculated (Patient's most recent lab result is older than the maximum 21 days allowed.).  COAG Lab Results  Component Value Date   INR 0.97 08/19/2016   INR 0.99 05/31/2016   INR 1.00 04/10/2016    Radiology No results found.  Assessment/Plan 1. Complication from renal dialysis device, sequela Recommend:  The patient is doing well and currently has adequate dialysis access. The patient's dialysis center is not reporting any access issues. Flow pattern shows good volume flow but there is an mild stricture in the proximal arm likely a valve.  Also she is having very mild steal syndrome which needs to be followed  The patient should have a duplex ultrasound of the dialysis access in 6  months. The patient will follow-up with me in the office after each ultrasound    2. ESRD on dialysis [facility]) continue to use catheter for about 2 more weeks then begin using the fistula  3. Essential hypertension Continue antihypertensive medications as already ordered, these medications have been reviewed and there are no changes at this time.   4. Coronary artery disease involving native  coronary artery of native heart without angina pectoris Continue cardiac and antihypertensive medications as already ordered and reviewed, no changes at this time.  Continue statin as ordered and reviewed, no changes at this time  Nitrates PRN for chest pain     Levora Dredge, MD  10/03/2016 9:30 AM

## 2016-11-07 ENCOUNTER — Other Ambulatory Visit (INDEPENDENT_AMBULATORY_CARE_PROVIDER_SITE_OTHER): Payer: Self-pay

## 2016-11-07 ENCOUNTER — Encounter (INDEPENDENT_AMBULATORY_CARE_PROVIDER_SITE_OTHER): Payer: Self-pay

## 2016-11-12 ENCOUNTER — Encounter
Admission: RE | Disposition: A | Discharge status: Home / Self Care | Payer: Self-pay | Source: Ambulatory Visit | Attending: Vascular Surgery

## 2016-11-12 ENCOUNTER — Encounter: Payer: Self-pay | Admitting: *Deleted

## 2016-11-12 ENCOUNTER — Ambulatory Visit
Admission: RE | Admit: 2016-11-12 | Discharge: 2016-11-12 | Disposition: A | Discharge status: Home / Self Care | Payer: Medicare HMO | Source: Ambulatory Visit | Attending: Vascular Surgery | Admitting: Vascular Surgery

## 2016-11-12 DIAGNOSIS — E1143 Type 2 diabetes mellitus with diabetic autonomic (poly)neuropathy: Secondary | ICD-10-CM | POA: Diagnosis not present

## 2016-11-12 DIAGNOSIS — I2510 Atherosclerotic heart disease of native coronary artery without angina pectoris: Secondary | ICD-10-CM | POA: Insufficient documentation

## 2016-11-12 DIAGNOSIS — Z9841 Cataract extraction status, right eye: Secondary | ICD-10-CM | POA: Insufficient documentation

## 2016-11-12 DIAGNOSIS — Z882 Allergy status to sulfonamides status: Secondary | ICD-10-CM | POA: Diagnosis not present

## 2016-11-12 DIAGNOSIS — I509 Heart failure, unspecified: Secondary | ICD-10-CM | POA: Insufficient documentation

## 2016-11-12 DIAGNOSIS — Z888 Allergy status to other drugs, medicaments and biological substances status: Secondary | ICD-10-CM | POA: Diagnosis not present

## 2016-11-12 DIAGNOSIS — Z992 Dependence on renal dialysis: Secondary | ICD-10-CM | POA: Insufficient documentation

## 2016-11-12 DIAGNOSIS — G43909 Migraine, unspecified, not intractable, without status migrainosus: Secondary | ICD-10-CM | POA: Diagnosis not present

## 2016-11-12 DIAGNOSIS — Z89511 Acquired absence of right leg below knee: Secondary | ICD-10-CM | POA: Insufficient documentation

## 2016-11-12 DIAGNOSIS — I252 Old myocardial infarction: Secondary | ICD-10-CM | POA: Insufficient documentation

## 2016-11-12 DIAGNOSIS — Z90710 Acquired absence of both cervix and uterus: Secondary | ICD-10-CM | POA: Diagnosis not present

## 2016-11-12 DIAGNOSIS — Z881 Allergy status to other antibiotic agents status: Secondary | ICD-10-CM | POA: Diagnosis not present

## 2016-11-12 DIAGNOSIS — E785 Hyperlipidemia, unspecified: Secondary | ICD-10-CM | POA: Insufficient documentation

## 2016-11-12 DIAGNOSIS — Z87891 Personal history of nicotine dependence: Secondary | ICD-10-CM | POA: Diagnosis not present

## 2016-11-12 DIAGNOSIS — Z452 Encounter for adjustment and management of vascular access device: Secondary | ICD-10-CM | POA: Diagnosis present

## 2016-11-12 DIAGNOSIS — E1122 Type 2 diabetes mellitus with diabetic chronic kidney disease: Secondary | ICD-10-CM | POA: Diagnosis not present

## 2016-11-12 DIAGNOSIS — Z8249 Family history of ischemic heart disease and other diseases of the circulatory system: Secondary | ICD-10-CM | POA: Diagnosis not present

## 2016-11-12 DIAGNOSIS — Z955 Presence of coronary angioplasty implant and graft: Secondary | ICD-10-CM | POA: Insufficient documentation

## 2016-11-12 DIAGNOSIS — N186 End stage renal disease: Secondary | ICD-10-CM | POA: Insufficient documentation

## 2016-11-12 DIAGNOSIS — I132 Hypertensive heart and chronic kidney disease with heart failure and with stage 5 chronic kidney disease, or end stage renal disease: Secondary | ICD-10-CM | POA: Diagnosis not present

## 2016-11-12 DIAGNOSIS — Z833 Family history of diabetes mellitus: Secondary | ICD-10-CM | POA: Insufficient documentation

## 2016-11-12 DIAGNOSIS — K219 Gastro-esophageal reflux disease without esophagitis: Secondary | ICD-10-CM | POA: Insufficient documentation

## 2016-11-12 DIAGNOSIS — Z90721 Acquired absence of ovaries, unilateral: Secondary | ICD-10-CM | POA: Diagnosis not present

## 2016-11-12 DIAGNOSIS — Z8049 Family history of malignant neoplasm of other genital organs: Secondary | ICD-10-CM | POA: Insufficient documentation

## 2016-11-12 DIAGNOSIS — Z818 Family history of other mental and behavioral disorders: Secondary | ICD-10-CM | POA: Insufficient documentation

## 2016-11-12 DIAGNOSIS — Z880 Allergy status to penicillin: Secondary | ICD-10-CM | POA: Diagnosis not present

## 2016-11-12 DIAGNOSIS — Z9842 Cataract extraction status, left eye: Secondary | ICD-10-CM | POA: Diagnosis not present

## 2016-11-12 HISTORY — PX: DIALYSIS/PERMA CATHETER REMOVAL: CATH118289

## 2016-11-12 SURGERY — DIALYSIS/PERMA CATHETER REMOVAL
Anesthesia: LOCAL

## 2016-11-12 MED ORDER — LIDOCAINE-EPINEPHRINE (PF) 1 %-1:200000 IJ SOLN
INTRAMUSCULAR | Status: DC | PRN
  Administered 2016-11-12: 20 mL via INTRADERMAL

## 2016-11-12 SURGICAL SUPPLY — 6 items
DERMABOND ADVANCED (GAUZE/BANDAGES/DRESSINGS) ×1
DERMABOND ADVANCED .7 DNX12 (GAUZE/BANDAGES/DRESSINGS) ×1 IMPLANT
SUT MNCRL 4-0 (SUTURE) ×1
SUT MNCRL 4-0 27XMFL (SUTURE) ×1
SUTURE MNCRL 4-0 27XMF (SUTURE) ×1 IMPLANT
TRAY LACERAT/PLASTIC (MISCELLANEOUS) ×2 IMPLANT

## 2016-11-12 NOTE — OR Nursing (Signed)
Follow up appointment made for pt for 8 weeks with Dr. Lorretta Harp. Left upper chest without bleeding or hematoma. Pt. Discharged to home with caretaker.

## 2016-11-12 NOTE — Op Note (Signed)
  OPERATIVE NOTE   PROCEDURE: 1. Removal of a left IJtunneled dialysis catheter  PRE-OPERATIVE DIAGNOSIS: Complication of dialysis catheter  POST-OPERATIVE DIAGNOSIS: Same  SURGEON: Levora Dredge  ANESTHESIA: Local anesthetic with 1% lidocaine with epinephrine   ESTIMATED BLOOD LOSS: Minimal   FINDING(S): 1. Catheter intact   SPECIMEN(S):  Catheter  INDICATIONS:   Kimberly Montgomery is a 51 y.o. female who presents with functioning right AV fistula but a nonfunctioning tunneled catheter.  DESCRIPTION: After obtaining full informed written consent, the patient was positioned supine. The left IJ catheter and surrounding area is prepped and draped in a sterile fashion. The cuff was localized by palpation and noted to be greater than 3 cm from the exit site. After appropriate timeout is called, 1% lidocaine with epinephrine is infiltrated into the surrounding tissues around the cuff. Small transverse incision is created with an 11 blade scalpel and the dissection was carried down to expose the cuff of the tunneled catheter.  The catheter is then freed from the surrounding attachments and adhesions. Once the catheter has been freed circumferentially it is transected just distal to the cuff and subsequently removed in 2 pieces. Light pressure was held at the base of the neck. A 4-0 Monocryl was used close the tunnel in the subcutaneous space. The 4-0 Monocryl Monocryl was then used to close the skin in a subcuticular stitch. Dermabond is applied.  Antibiotic ointment and a sterile dressing is applied to the exit site. Patient tolerated procedure well and there were no complications.  COMPLICATIONS: None  CONDITION: Unchanged  Levora Dredge. [facility] Office: [phone]  11/12/2016,12:13 PM

## 2016-11-12 NOTE — H&P (Signed)
[facility] Admission History & Physical  MRN : [id number]  Kimberly Montgomery is a 51 y.o. (February 13, 1966) female who presents with chief complaint of here for tunneled catheter removal.  History of Present Illness: I am asked to evaluate the patient by the dialysis center. The patient was sent here because they have a nonfunctioning tunneled catheter and a functioning right brachiocephalic fistula.  The patient reports they're not been any problems with any of their dialysis runs. They are reporting good flows with good parameters at dialysis.  Patient denies pain or tenderness overlying the access.  There is no pain with dialysis.  The patient denies hand pain or finger pain consistent with steal syndrome.  No fevers or chills while on dialysis.   No current facility-administered medications for this encounter.     Past Medical History:  Diagnosis Date  . Anginal pain (HCC)   . Anxiety   . Bipolar disorder (HCC)   . CAD (coronary artery disease)   . CHF (congestive heart failure) (HCC)   . Chronic lower back pain   . Depression   . Endometriosis   . ESRD (end stage renal disease) on dialysis (HCC)    "[facility]; [address]; [place]; TTS" (01/19/2016)  . Gastroparesis   . GERD (gastroesophageal reflux disease)   . History of blood transfusion "several"   "my blood would get low; low RBC"  . History of hiatal hernia   . HLD (hyperlipidemia)   . Hypertension   . Migraine    "monthly" (01/19/2016)  . Myocardial infarction (HCC) 2017   "~ 3 wks ago" (01/19/2016)  . Renal disorder   . Renal insufficiency   . Seizures (HCC) 07/2015   "I've only had the 1; don't know what from" (01/19/2016)  . Type II diabetes mellitus (HCC)     Past Surgical History:  Procedure Laterality Date  . ABDOMINAL HYSTERECTOMY     "partial"  . AV FISTULA PLACEMENT Right 08/23/2016   Procedure: ARTERIOVENOUS (AV) FISTULA CREATION  ( BRACHIAL CEPHALIC );  Surgeon: Renford Dills, MD;  Location: [facility] ORS;  Service: Vascular;  Laterality: Right;  . BELOW KNEE LEG AMPUTATION Right 2010?  [name] CARDIAC CATHETERIZATION Right 07/10/2015   Procedure: Left Heart Cath and Coronary Angiography;  Surgeon: Laurier Nancy, MD;  Location: [facility] INVASIVE CV LAB;  Service: Cardiovascular;  Laterality: Right;  . CARDIAC CATHETERIZATION Right 09/14/2015   Procedure: Left Heart Cath and Coronary Angiography;  Surgeon: Laurier Nancy, MD;  Location: [facility] INVASIVE CV LAB;  Service: Cardiovascular;  Laterality: Right;  . CARDIAC CATHETERIZATION N/A 09/14/2015   Procedure: Coronary Stent Intervention;  Surgeon: Alwyn Pea, MD;  Location: [facility] INV[facility] CV LAB;  Service: Cardiovascular;  Laterality: N/A;  . CARDIAC CATHETERIZATION Right 11/20/2015   Procedure: Left Heart Cath and Coronary Angiography;  Surgeon: Laurier Nancy, MD;  Location: [facility] INVASIVE CV LAB;  Service: Cardiovascular;  Laterality: Right;  . CATARACT EXTRACTION, BILATERAL    . CORONARY ANGIOPLASTY WITH STENT PLACEMENT  <2017   @ [facility]/notes 07/03/2013  . CORONARY ANGIOPLASTY WITH STENT PLACEMENT    . HYSTEROTOMY    . PERIPHERAL VASCULAR CATHETERIZATION N/A 08/30/2015   Procedure: Dialysis/Perma Catheter Insertion;  Surgeon: Annice Needy, MD;  Location: [facility] INV[facility] CV [facility];  Service: Cardiovascular;  Laterality: N/A;  . PERIPHERAL VASCULAR CATHETERIZATION N/A 01/01/2016   Procedure: Dialysis/Perma Catheter Insertion;  Surgeon: Annice Needy, MD;  Location: [facility] INV[facility] CV [facility];  Service: Cardiovascular;  Laterality: N/A;  . PERITONEAL CATHETER INSERTION  11/03/2013   [name] 11/03/2013  . PERITONEAL CATHETER REMOVAL  01/01/2016   "took the one from May out; put new PD cath in" (01/19/2016)  . PERITONEAL CATHETER REMOVAL  11/03/2013; 02/11/2014   [name] 11/03/2013; Removal of tunneled catheter/notes 02/11/2014  . SALPINGOOPHORECTOMY Right    [name] 07/03/2013  . TEE WITHOUT CARDIOVERSION N/A 06/07/2016   Procedure:  TRANSESOPHAGEAL ECHOCARDIOGRAM (TEE);  Surgeon: Chrystie Nose, MD;  Location: [facility];  Service: Cardiovascular;  Laterality: N/A;  . TUBAL LIGATION      Social History Social History  Substance Use Topics  . Smoking status: Former Smoker    Packs/day: 0.75    Types: Cigarettes    Quit date: 07/01/2015  . Smokeless tobacco: Never Used  . Alcohol use 0.0 oz/week     Comment: 01/19/2016 "might have a couple drinks/year"    Family History Family History  Problem Relation Age of Onset  . CAD Unknown   . Diabetes Unknown   . Bipolar disorder Unknown   . Cervical cancer Mother   . Breast cancer Neg Hx     No family history of bleeding or clotting disorders, autoimmune disease or porphyria  Allergies  Allergen Reactions  . Cephalosporins Anaphylaxis    Patient has tolerated meropenem.   [name] Penicillins Anaphylaxis and Other (See Comments)    Has patient had a PCN reaction causing immediate rash, facial/tongue/throat swelling, SOB or lightheadedness with hypotension: Yes Has patient had a PCN reaction causing severe rash involving mucus membranes or skin necrosis: No Has patient had a PCN reaction that required hospitalization No Has patient had a PCN reaction occurring within the last 10 years: No If all of the above answers are "NO", then may proceed with Cephalosporin use.  . Reglan [Metoclopramide] Other (See Comments)    Severe EPS (lip, head, body tremors) March 2018  . Risperdal [Risperidone] Other (See Comments)    Severe EPS (lip, head, body tremors) March 2018  . Lamictal [Lamotrigine] Other (See Comments)    Reaction:  Hallucinations  . Phenergan [Promethazine Hcl] Nausea And Vomiting    Sensitivity to medicine  . Pravastatin Other (See Comments)    Reaction:  Muscle pain   . Sulfa Antibiotics Other (See Comments)    Reaction:  Unknown      REVIEW OF SYSTEMS (Negative unless checked)  Constitutional: [] Weight loss  [] Fever  [] Chills Cardiac: [] Chest pain    [] Chest pressure   [] Palpitations   [] Shortness of breath when laying flat   [] Shortness of breath at rest   [x] Shortness of breath with exertion. Vascular:  [] Pain in legs with walking   [] Pain in legs at rest   [] Pain in legs when laying flat   [] Claudication   [] Pain in feet when walking  [] Pain in feet at rest  [] Pain in feet when laying flat   [] History of DVT   [] Phlebitis   [] Swelling in legs   [] Varicose veins   [] Non-healing ulcers Pulmonary:   [] Uses home oxygen   [] Productive cough   [] Hemoptysis   [] Wheeze  [] COPD   [] Asthma Neurologic:  [] Dizziness  [] Blackouts   [] Seizures   [] History of stroke   [] History of TIA  [] Aphasia   [] Temporary blindness   [] Dysphagia   [] Weakness or numbness in arms   [] Weakness or numbness in legs Musculoskeletal:  [] Arthritis   [] Joint swelling   [] Joint pain   [] Low back pain Hematologic:  [] Easy bruising  [] Easy bleeding   []   Hypercoagulable state   [] Anemic  [] Hepatitis Gastrointestinal:  [] Blood in stool   [] Vomiting blood  [] Gastroesophageal reflux/heartburn   [] Difficulty swallowing. Genitourinary:  [x] Chronic kidney disease   [] Difficult urination  [] Frequent urination  [] Burning with urination   [] Blood in urine Skin:  [] Rashes   [] Ulcers   [] Wounds Psychological:  [] History of anxiety   []  History of major depression.  Physical Examination  There were no vitals filed for this visit. There is no height or weight on file to calculate BMI. Gen: WD/WN, NAD Head: [place]/AT, No temporalis wasting. Prominent temp pulse not noted. Ear/Nose/Throat: Hearing grossly intact, nares w/o erythema or drainage, oropharynx w/o Erythema/Exudate,  Eyes: Conjunctiva clear, sclera non-icteric Neck: Trachea midline.  No JVD.  Pulmonary:  Good air movement, respirations not labored, no use of accessory muscles.  Cardiac: RRR, normal S1, S2. Vascular: right brachiocephalic fistula good thrill good bruit Vessel Right Left  Radial Palpable Palpable  Ulnar Not Palpable  Not Palpable  Brachial Palpable Palpable  Carotid Palpable, without bruit Palpable, without bruit  Gastrointestinal: soft, non-tender/non-distended. No guarding/reflex.  Musculoskeletal: M/S 5/5 throughout.  Extremities without ischemic changes.  No deformity or atrophy.  Neurologic: Sensation grossly intact in extremities.  Symmetrical.  Speech is fluent. Motor exam as listed above. Psychiatric: Judgment intact, Mood & affect appropriate for pt's clinical situation. Dermatologic: No rashes or ulcers noted.  No cellulitis or open wounds. Lymph : No Cervical, Axillary, or Inguinal lymphadenopathy.   CBC Lab Results  Component Value Date   WBC 7.9 08/19/2016   HGB 11.2 (L) 08/23/2016   HCT 33.0 (L) 08/23/2016   MCV 88.3 08/19/2016   PLT 379 08/19/2016    BMET    Component Value Date/Time   NA 134 (L) 08/23/2016 0654   NA 136 (A) 03/27/2016   NA 136 07/17/2014 0417   K 4.1 08/23/2016 0654   K 3.5 07/17/2014 0417   CL 96 (L) 08/19/2016 1308   CL 103 07/17/2014 0417   CO2 27 08/19/2016 1308   CO2 26 07/17/2014 0417   GLUCOSE 141 (H) 08/23/2016 0654   GLUCOSE 221 (H) 07/17/2014 0417   BUN 28 (H) 08/19/2016 1308   BUN 46 (A) 03/27/2016   BUN 32 (H) 07/17/2014 0417   CREATININE 2.52 (H) 08/19/2016 1308   CREATININE 3.45 (H) 07/17/2014 0417   CALCIUM 9.2 08/19/2016 1308   CALCIUM 8.4 (L) 07/17/2014 0417   GFRNONAA 21 (L) 08/19/2016 1308   GFRNONAA 15 (L) 07/17/2014 0417   GFRAA 24 (L) 08/19/2016 1308   GFRAA 17 (L) 07/17/2014 0417   CrCl cannot be calculated (Patient's most recent lab result is older than the maximum 21 days allowed.).  COAG Lab Results  Component Value Date   INR 0.97 08/19/2016   INR 0.99 05/31/2016   INR 1.00 04/10/2016    Radiology No results found.  Assessment/Plan 1.  Complication dialysis device with thrombosis AV access:  Patient's Tunneled catheter is clotted and therefore malfunctioning. The patient has an extremity access that is  functioning well. Therefore, the patient will undergo removal of the tunneled catheter under local anesthesia.  The risks and benefits were described to the patient.  All questions were answered.  The patient agrees to proceed with angiography and intervention.  2.  End-stage renal disease requiring hemodialysis:  Patient will continue dialysis therapy without further interruption if a successful intervention is not achieved then a tunneled catheter will be placed. Dialysis has already been arranged. 3.  Hypertension:  Patient will  continue medical management; nephrology is following no changes in oral medications. 4. Diabetes mellitus:  Glucose will be monitored and oral medications been held this morning once the patient has undergone the patient's procedure po intake will be reinitiated and again Accu-Cheks will be used to assess the blood glucose level and treat as needed. The patient will be restarted on the patient's usual hypoglycemic regime 5.  Coronary artery disease:  EKG will be monitored. Nitrates will be used if needed. The patient's oral cardiac medications will be continued.    Levora Dredge, MD  11/12/2016 10:21 AM

## 2016-11-13 ENCOUNTER — Encounter: Payer: Self-pay | Admitting: Vascular Surgery

## 2016-12-18 ENCOUNTER — Ambulatory Visit: Payer: Medicare HMO | Admitting: Diagnostic Neuroimaging

## 2017-01-07 ENCOUNTER — Encounter: Payer: Self-pay | Admitting: Diagnostic Neuroimaging

## 2017-01-07 ENCOUNTER — Ambulatory Visit (INDEPENDENT_AMBULATORY_CARE_PROVIDER_SITE_OTHER): Payer: Medicare HMO | Admitting: Diagnostic Neuroimaging

## 2017-01-07 ENCOUNTER — Encounter (INDEPENDENT_AMBULATORY_CARE_PROVIDER_SITE_OTHER): Payer: Self-pay

## 2017-01-07 VITALS — BP 151/69 | HR 57

## 2017-01-07 DIAGNOSIS — G2401 Drug induced subacute dyskinesia: Secondary | ICD-10-CM | POA: Diagnosis not present

## 2017-01-07 DIAGNOSIS — G40909 Epilepsy, unspecified, not intractable, without status epilepticus: Secondary | ICD-10-CM

## 2017-01-07 DIAGNOSIS — G251 Drug-induced tremor: Secondary | ICD-10-CM

## 2017-01-07 NOTE — Progress Notes (Signed)
[facility]  PATIENT: Kimberly Montgomery DOB: February 18, 1966  REFERRING CLINICIAN:  HISTORY FROM: patient and chart review REASON FOR VISIT: follow up     HISTORICAL  CHIEF COMPLAINT:  Chief Complaint  Patient presents with  . Seizures    rm 6, CNA- Steward Drone lives at [facility], [place], "no seizure actvity; having tremors in my face and hands, not new but worse  . Follow-up    6 month    HISTORY OF PRESENT ILLNESS:   UPDATE (01/07/17, VRP): Since last visit, doing well. No seizures. Tremors are slightly better now on klonopin. Also weaning off gabapentin to see if tremors get better. Tolerating LEV for seizure prevention. No alleviating or aggravating factors.   PRIOR HPI (06/17/16): 51 year old female with hypertension, end-stage renal disease, here for evaluation of seizures. Patient presented to [facility] on 05/31/16 for altered mental status, hypertensive emergency, seizure activity. Patient was treated with Keppra and seizure stopped. He also was having tremors, possibly extrapyramidal symptoms related to Reglan and Risperdal. Tremors gradually reduced with medication adjustment. Patient also had coag-negative methicillin-resistant staph bacteremia, felt to be contamination. Transthoracic and transesophageal echocardiogram were unremarkable. Since that time no further seizures. Patient tolerating medications. Patient living at [facility] skilled nursing facility in [place].    REVIEW OF SYSTEMS: Full 14 system review of systems performed and negative with exception of: tremors back pain walking diff eye itching.    ALLERGIES: Allergies  Allergen Reactions  . Cephalosporins Anaphylaxis    Patient has tolerated meropenem.   [name] Penicillins Anaphylaxis and Other (See Comments)    Has patient had a PCN reaction causing immediate rash, facial/tongue/throat swelling, SOB or lightheadedness with hypotension: Yes Has patient had a PCN reaction  causing severe rash involving mucus membranes or skin necrosis: No Has patient had a PCN reaction that required hospitalization No Has patient had a PCN reaction occurring within the last 10 years: No If all of the above answers are "NO", then may proceed with Cephalosporin use.  . Reglan [Metoclopramide] Other (See Comments)    Severe EPS (lip, head, body tremors) March 2018  . Risperdal [Risperidone] Other (See Comments)    Severe EPS (lip, head, body tremors) March 2018  . Lamictal [Lamotrigine] Other (See Comments)    Reaction:  Hallucinations  . Phenergan [Promethazine Hcl] Nausea And Vomiting    Sensitivity to medicine  . Pravastatin Other (See Comments)    Reaction:  Muscle pain   . Sulfa Antibiotics Other (See Comments)    Reaction:  Unknown     HOME MEDICATIONS: Outpatient Medications Prior to Visit  Medication Sig Dispense Refill  . Amino Acids-Protein Hydrolys (FEEDING SUPPLEMENT, PRO-STAT SUGAR FREE 64,) LIQD Take 30 mLs by mouth 3 (three) times daily.     [name] amLODipine (NORVASC) 10 MG tablet Take 10 mg by mouth daily.     . ARIPiprazole (ABILIFY) 5 MG tablet Take 5 mg by mouth daily.     [name] aspirin 81 MG chewable tablet Chew 81 mg by mouth daily.    [name] atorvastatin (LIPITOR) 40 MG tablet Take 40 mg by mouth daily at 6 PM.    . bisacodyl (DULCOLAX) 10 MG suppository Place 10 mg rectally as needed for moderate constipation.    . carboxymethylcellulose (REFRESH) 1 % ophthalmic solution Place 1 drop into both eyes 3 (three) times daily.    . clonazePAM (KLONOPIN) 0.5 MG tablet Take 0.5 mg by mouth at bedtime.    . clopidogrel (  PLAVIX) 75 MG tablet Take 75 mg by mouth daily at 12 noon.     . fluticasone (FLONASE) 50 MCG/ACT nasal spray Place 1 spray into both nostrils daily.     [name] gabapentin (NEURONTIN) 300 MG capsule Take 300 mg by mouth daily.     . hydrALAZINE (APRESOLINE) 50 MG tablet Take 1 tablet (50 mg total) by mouth 3 (three) times daily. (Patient taking differently:  Take 75 mg by mouth 3 (three) times daily. )    . insulin aspart (NOVOLOG) 100 UNIT/ML injection Inject 8 Units into the skin 3 (three) times daily with meals.     . insulin detemir (LEVEMIR) 100 UNIT/ML injection Inject 6 Units into the skin at bedtime.     . isosorbide mononitrate (IMDUR) 30 MG 24 hr tablet Take 90 mg by mouth daily at 12 noon.     . lactulose (CHRONULAC) 10 GM/15ML solution Take 10 g by mouth daily.     [name] levETIRAcetam (KEPPRA) 750 MG tablet Take 1,500 mg by mouth daily at 12 noon.    . lidocaine (LIDODERM) 5 % Place 1 patch onto the skin at bedtime.     . lidocaine (LMX) 4 % cream Apply 1 application topically daily as needed (pain). To right arm fistula    . lisinopril (PRINIVIL,ZESTRIL) 10 MG tablet Take 1 tablet (10 mg total) by mouth daily.    . Melatonin 3 MG TABS Take 3 mg by mouth at bedtime.    . metoprolol (LOPRESSOR) 50 MG tablet Take 1 tablet (50 mg total) by mouth 2 (two) times daily.    . Multiple Vitamin (MULTIVITAMIN WITH MINERALS) TABS tablet Take 1 tablet by mouth daily.    [name] omeprazole (PRILOSEC) 20 MG capsule Take 20 mg by mouth 2 (two) times daily.     . ondansetron (ZOFRAN ODT) 4 MG disintegrating tablet Take 1 tablet (4 mg total) by mouth every 6 (six) hours as needed for nausea or vomiting. (Patient taking differently: Take 4 mg by mouth every 8 (eight) hours as needed for nausea or vomiting. ) 20 tablet 0  . polyethylene glycol (MIRALAX / GLYCOLAX) packet Take 17 g by mouth daily. (Patient taking differently: Take 17 g by mouth daily at 12 noon. ) 14 each 0  . senna (SENOKOT) 8.6 MG TABS tablet Take 2 tablets by mouth 2 (two) times daily.     . sevelamer carbonate (RENVELA) 800 MG tablet Take 800 mg by mouth See admin instructions. Take 1 tablet four times a day. (with each meal and with bedtime snack.)    . torsemide (DEMADEX) 100 MG tablet Take 100 mg by mouth daily.     [name] HYDROcodone-acetaminophen (NORCO) 5-325 MG tablet Take 1-2 tablets by mouth  every 6 (six) hours as needed for moderate pain or severe pain. (Patient not taking: Reported on 01/07/2017) 50 tablet 0  . nitroGLYCERIN (NITROSTAT) 0.4 MG SL tablet Place 0.4 mg under the tongue every 5 (five) minutes as needed for chest pain. Reported on 07/10/2015    . oxyCODONE-acetaminophen (PERCOCET/ROXICET) 5-325 MG tablet Take 1 tablet by mouth every 12 (twelve) hours as needed for severe pain. Do not exceed 4gm of Tylenol in 24 hours (Patient not taking: Reported on 01/07/2017) 10 tablet 0   No facility-administered medications prior to visit.     PAST MEDICAL HISTORY: Past Medical History:  Diagnosis Date  . Anginal pain (HCC)   . Anxiety   . Bipolar disorder (HCC)   . CAD (coronary  artery disease)   . CHF (congestive heart failure) (HCC)   . Chronic lower back pain   . Depression   . Endometriosis   . ESRD (end stage renal disease) on dialysis (HCC)    "[facility]; [address]; [place]; TTS" (01/19/2016)  . Gastroparesis   . GERD (gastroesophageal reflux disease)   . History of blood transfusion "several"   "my blood would get low; low RBC"  . History of hiatal hernia   . HLD (hyperlipidemia)   . Hypertension   . Migraine    "monthly" (01/19/2016)  . Myocardial infarction (HCC) 2017   "~ 3 wks ago" (01/19/2016)  . Renal disorder   . Renal insufficiency   . Seizures (HCC) 07/2015   "I've only had the 1; don't know what from" (01/19/2016)  . Type II diabetes mellitus (HCC)     PAST SURGICAL HISTORY: Past Surgical History:  Procedure Laterality Date  . ABDOMINAL HYSTERECTOMY     "partial"  . AV FISTULA PLACEMENT Right 08/23/2016   Procedure: ARTERIOVENOUS (AV) FISTULA CREATION  ( BRACHIAL CEPHALIC );  Surgeon: Renford Dills, MD;  Location: [facility] ORS;  Service: Vascular;  Laterality: Right;  . BELOW KNEE LEG AMPUTATION Right 2010?  [name] CARDIAC CATHETERIZATION Right 07/10/2015   Procedure: Left Heart Cath and Coronary Angiography;  Surgeon: Laurier Nancy, MD;   Location: [facility] INVASIVE CV LAB;  Service: Cardiovascular;  Laterality: Right;  . CARDIAC CATHETERIZATION Right 09/14/2015   Procedure: Left Heart Cath and Coronary Angiography;  Surgeon: Laurier Nancy, MD;  Location: [facility] INVASIVE CV LAB;  Service: Cardiovascular;  Laterality: Right;  . CARDIAC CATHETERIZATION N/A 09/14/2015   Procedure: Coronary Stent Intervention;  Surgeon: Alwyn Pea, MD;  Location: [facility] INVASIVE CV LAB;  Service: Cardiovascular;  Laterality: N/A;  . CARDIAC CATHETERIZATION Right 11/20/2015   Procedure: Left Heart Cath and Coronary Angiography;  Surgeon: Laurier Nancy, MD;  Location: [facility] INVASIVE CV LAB;  Service: Cardiovascular;  Laterality: Right;  . CATARACT EXTRACTION, BILATERAL    . CORONARY ANGIOPLASTY WITH STENT PLACEMENT  <2017   @ [facility]/notes 07/03/2013  . CORONARY ANGIOPLASTY WITH STENT PLACEMENT    . DIALYSIS/PERMA CATHETER REMOVAL N/A 11/12/2016   Procedure: DIALYSIS/PERMA CATHETER REMOVAL;  Surgeon: Renford Dills, MD;  Location: [facility] INVASIVE CV LAB;  Service: Cardiovascular;  Laterality: N/A;  . HYSTEROTOMY    . PERIPHERAL VASCULAR CATHETERIZATION N/A 08/30/2015   Procedure: Dialysis/Perma Catheter Insertion;  Surgeon: Annice Needy, MD;  Location: [facility] INVASIVE CV LAB;  Service: Cardiovascular;  Laterality: N/A;  . PERIPHERAL VASCULAR CATHETERIZATION N/A 01/01/2016   Procedure: Dialysis/Perma Catheter Insertion;  Surgeon: Annice Needy, MD;  Location: [facility] INVASIVE CV LAB;  Service: Cardiovascular;  Laterality: N/A;  . PERITONEAL CATHETER INSERTION  11/03/2013   [name] 11/03/2013  . PERITONEAL CATHETER REMOVAL  01/01/2016   "took the one from May out; put new PD cath in" (01/19/2016)  . PERITONEAL CATHETER REMOVAL  11/03/2013; 02/11/2014   [name] 11/03/2013; Removal of tunneled catheter/notes 02/11/2014  . SALPINGOOPHORECTOMY Right    [name] 07/03/2013  . TEE WITHOUT CARDIOVERSION N/A 06/07/2016   Procedure: TRANSESOPHAGEAL ECHOCARDIOGRAM (TEE);  Surgeon:  Chrystie Nose, MD;  Location: [facility] ENDOSCOPY;  Service: Cardiovascular;  Laterality: N/A;  . TUBAL LIGATION      FAMILY HISTORY: Family History  Problem Relation Age of Onset  . CAD Unknown   . Diabetes Unknown   . Bipolar disorder Unknown   . Cervical cancer Mother   . Other Father  heart bypass  . Breast cancer Neg Hx     SOCIAL HISTORY:  Social History   Social History  . Marital status: Widowed    Spouse name: N/A  . Number of children: 2  . Years of education: N/A   Occupational History  . disabled    Social History Main Topics  . Smoking status: Former Smoker    Packs/day: 0.75    Types: Cigarettes    Quit date: 07/01/2015  . Smokeless tobacco: Never Used  . Alcohol use No  . Drug use: No  . Sexual activity: Not Currently   Other Topics Concern  . Not on file   Social History Narrative   06/17/16 currently at [facility]     PHYSICAL EXAM  GENERAL EXAM/CONSTITUTIONAL: Vitals:  Vitals:   01/07/17 1134  BP: (!) 151/69  Pulse: (!) 57   There is no height or weight on file to calculate BMI. No exam data present  Patient is in no distress; well developed, nourished and groomed; neck is supple  SLOW MOVEMENTS THROUGHTOUT  CARDIOVASCULAR:  Examination of carotid arteries is normal; no carotid bruits  Regular rate and rhythm, no murmurs  Examination of peripheral vascular system by observation and palpation is normal  LEFT SUBCLAVIAN CENTRAL CATHETER DIALYSIS ACCESS; CLEAN AND DRY  EYES:  Ophthalmoscopic exam of optic discs and posterior segments is normal; no papilledema or hemorrhages  MUSCULOSKELETAL:  Gait, strength, tone, movements noted in Neurologic exam below  NEUROLOGIC: MENTAL STATUS:  No flowsheet data found.  awake, alert, oriented to person, place and time  recent and remote memory intact  normal attention and concentration  language fluent, comprehension intact, naming intact,   fund of knowledge  appropriate  CRANIAL NERVE:   2nd - no papilledema on fundoscopic exam  2nd, 3rd, 4th, 6th - pupils equal and reactive to light, visual fields full to confrontation, extraocular muscles --> END GAZE NYSTAGMUS IN ALL DIRECTIONS  5th - facial sensation symmetric  7th - facial strength symmetric  8th - hearing intact  9th - palate elevates symmetrically, uvula midline  11th - shoulder shrug symmetric  12th - tongue protrusion midline  RESTING HEAD TREMOR AND MOUTH DYSKINESIAS  MOTOR:   FINE POSTURAL TREMOR IN BUE  MILD REST TREMOR IN BUE  BRADYKINESIA IN BUE  normal bulk and tone, full strength in the BUE, LLE  RIGHT BELOW KNEE AMPUTATION  SENSORY:   normal and symmetric to light touch, temperature, vibration  EXCEPT ABSENT VIB IN LEFT FOOT AND KNEE  COORDINATION:   finger-nose-finger, fine finger movements SLOW  REFLEXES:   deep tendon reflexes TRACE and symmetric  RIGHT BELOW KNEE AMPUTATION  GAIT/STATION:   IN WHEELCHAIR; RIGHT BELOW KNEE AMPUTATION; CANNOT STAND UP    DIAGNOSTIC DATA (LABS, IMAGING, TESTING) - I reviewed patient records, labs, notes, testing and imaging myself where available.  Lab Results  Component Value Date   WBC 7.9 08/19/2016   HGB 11.2 (L) 08/23/2016   HCT 33.0 (L) 08/23/2016   MCV 88.3 08/19/2016   PLT 379 08/19/2016      Component Value Date/Time   NA 134 (L) 08/23/2016 0654   NA 136 (A) 03/27/2016   NA 136 07/17/2014 0417   K 4.1 08/23/2016 0654   K 3.5 07/17/2014 0417   CL 96 (L) 08/19/2016 1308   CL 103 07/17/2014 0417   CO2 27 08/19/2016 1308   CO2 26 07/17/2014 0417   GLUCOSE 141 (H) 08/23/2016 0654  GLUCOSE 221 (H) 07/17/2014 0417   BUN 28 (H) 08/19/2016 1308   BUN 46 (A) 03/27/2016   BUN 32 (H) 07/17/2014 0417   CREATININE 2.52 (H) 08/19/2016 1308   CREATININE 3.45 (H) 07/17/2014 0417   CALCIUM 9.2 08/19/2016 1308   CALCIUM 8.4 (L) 07/17/2014 0417   PROT 6.4 (L) 07/29/2016 1025   PROT 5.7 (L)  07/16/2014 2132   ALBUMIN 3.4 (L) 07/29/2016 1025   ALBUMIN 2.6 (L) 07/16/2014 2132   AST 16 07/29/2016 1025   AST 14 (L) 07/16/2014 2132   ALT 11 (L) 07/29/2016 1025   ALT 9 (L) 07/16/2014 2132   ALKPHOS 97 07/29/2016 1025   ALKPHOS 104 07/16/2014 2132   BILITOT 0.5 07/29/2016 1025   BILITOT 0.4 07/16/2014 2132   GFRNONAA 21 (L) 08/19/2016 1308   GFRNONAA 15 (L) 07/17/2014 0417   GFRAA 24 (L) 08/19/2016 1308   GFRAA 17 (L) 07/17/2014 0417   Lab Results  Component Value Date   CHOL 215 (H) 11/19/2015   HDL 63 11/19/2015   LDLCALC 115 (H) 11/19/2015   TRIG 186 (H) 11/19/2015   CHOLHDL 3.4 11/19/2015   Lab Results  Component Value Date   HGBA1C 6.2 (H) 09/13/2015   Lab Results  Component Value Date   VITAMINB12 635 06/06/2016   Lab Results  Component Value Date   TSH 2.417 06/01/2016    05/31/16 MRI brain 1. No acute intracranial abnormality to explain 7 not altered mental status. 2. Remote lacunar infarcts within the posterior circulation as described. 3. Mild generalized atrophy and white matter disease is advanced for age.  06/02/16 continuous VEEG - This is an abnormal EEGdue to the presence of the following: 1) Generalized polymorphic delta slowing; 2) Intermittent triphasic waves. Taken together, these results suggest severe encephalopathy, most likely due to toxic-metabolic etiology, but medication effect could not be excluded.No epileptiform discharges or seizures were present.       ASSESSMENT AND PLAN  51 y.o. year old female here with end-stage renal disease on hemodialysis, presenting with hypertensive emergency, tremors and seizures. Now on antiseizure medication and stable. Extrapyramidal symptoms and tremors have subsided.   Dx:  1. Seizure disorder (HCC)   2. Tardive dyskinesia   3. Medication-induced postural tremor      PLAN:  SEIZURE  - continue levetiracetam  daily  TREMORS (tardive tremor / dyskinesia due to prior risperdal  and reglan usage; vs metabolic / ESRD vs essential tremor) - continue klonopin per PCP - consider tetrabenazine or valbenazine  Return if symptoms worsen or fail to improve. AS NEEDED.    Suanne Marker, MD 01/07/2017, 12:16 PM Certified in Neurology, Neurophysiology and Neuroimaging  [facility] [address] [place], [place] [address] [phone]

## 2017-01-09 ENCOUNTER — Encounter (INDEPENDENT_AMBULATORY_CARE_PROVIDER_SITE_OTHER): Payer: Medicare HMO | Admitting: Vascular Surgery

## 2017-01-29 NOTE — Progress Notes (Signed)
MRN : [id number]  Kimberly Montgomery is a 51 y.o. (May 24, 1965) female who presents with chief complaint of No chief complaint on file. [name]  History of Present Illness:  The patient returns to the office for followup of their dialysis access.    previous PROCEDURE: right brachial cephalic arteriovenous fistula placement on 08/23/2016; tunneled catheter was recently removed  The function of the access has been stable. The patient denies increased bleeding time or increased recirculation. Patient denies difficulty with cannulation. The patient denies hand pain or other symptoms consistent with steal phenomena.  No significant arm swelling.  The patient denies redness or swelling at the access site. The patient denies fever or chills at home or while on dialysis.  She is now standing transferring and walking short distances with her right leg prosthesis.  The patient denies claudication symptoms or rest pain symptoms.  She is also c/o increased problems from her tremor.  She notes that her medications were changes several months ago but she has not followed up and she feels things are worse  The patient denies amaurosis fugax or recent TIA symptoms. There are no recent neurological changes noted. The patient denies history of DVT, PE or superficial thrombophlebitis. The patient denies recent episodes of angina or shortness of breath.        No outpatient prescriptions have been marked as taking for the 01/30/17 encounter (Appointment) with Gilda Crease, Latina Craver, MD.    Past Medical History:  Diagnosis Date  . Anginal pain (HCC)   . Anxiety   . Bipolar disorder (HCC)   . CAD (coronary artery disease)   . CHF (congestive heart failure) (HCC)   . Chronic lower back pain   . Depression   . Endometriosis   . ESRD (end stage renal disease) on dialysis (HCC)    "[facility]; [address]; [place]; TTS" (01/19/2016)  . Gastroparesis   . GERD (gastroesophageal reflux disease)   . History of blood  transfusion "several"   "my blood would get low; low RBC"  . History of hiatal hernia   . HLD (hyperlipidemia)   . Hypertension   . Migraine    "monthly" (01/19/2016)  . Myocardial infarction (HCC) 2017   "~ 3 wks ago" (01/19/2016)  . Renal disorder   . Renal insufficiency   . Seizures (HCC) 07/2015   "I've only had the 1; don't know what from" (01/19/2016)  . Type II diabetes mellitus (HCC)     Past Surgical History:  Procedure Laterality Date  . ABDOMINAL HYSTERECTOMY     "partial"  . AV FISTULA PLACEMENT Right 08/23/2016   Procedure: ARTERIOVENOUS (AV) FISTULA CREATION  ( BRACHIAL CEPHALIC );  Surgeon: Renford Dills, MD;  Location: [facility] ORS;  Service: Vascular;  Laterality: Right;  . BELOW KNEE LEG AMPUTATION Right 2010?  [name] CARDIAC CATHETERIZATION Right 07/10/2015   Procedure: Left Heart Cath and Coronary Angiography;  Surgeon: Laurier Nancy, MD;  Location: [facility];  Service: Cardiovascular;  Laterality: Right;  . CARDIAC CATHETERIZATION Right 09/14/2015   Procedure: Left Heart Cath and Coronary Angiography;  Surgeon: Laurier Nancy, MD;  Location: [facility];  Service: Cardiovascular;  Laterality: Right;  . CARDIAC CATHETERIZATION N/A 09/14/2015   Procedure: Coronary Stent Intervention;  Surgeon: Alwyn Pea, MD;  Location: [facility];  Service: Cardiovascular;  Laterality: N/A;  . CARDIAC CATHETERIZATION Right 11/20/2015   Procedure: Left Heart Cath and Coronary Angiography;  Surgeon: Laurier Nancy, MD;  Location: [facility];  Service: Cardiovascular;  Laterality: Right;  . CATARACT EXTRACTION, BILATERAL    . CORONARY ANGIOPLASTY WITH STENT PLACEMENT  <2017   @ [facility]/notes 07/03/2013  . CORONARY ANGIOPLASTY WITH STENT PLACEMENT    . DIALYSIS/PERMA CATHETER REMOVAL N/A 11/12/2016   Procedure: DIALYSIS/PERMA CATHETER REMOVAL;  Surgeon: Renford Dills, MD;  Location: [facility];  Service: Cardiovascular;  Laterality:  N/A;  . HYSTEROTOMY    . PERIPHERAL VASCULAR CATHETERIZATION N/A 08/30/2015   Procedure: Dialysis/Perma Catheter Insertion;  Surgeon: Annice Needy, MD;  Location: [facility];  Service: Cardiovascular;  Laterality: N/A;  . PERIPHERAL VASCULAR CATHETERIZATION N/A 01/01/2016   Procedure: Dialysis/Perma Catheter Insertion;  Surgeon: Annice Needy, MD;  Location: [facility];  Service: Cardiovascular;  Laterality: N/A;  . PERITONEAL CATHETER INSERTION  11/03/2013   [name] 11/03/2013  . PERITONEAL CATHETER REMOVAL  01/01/2016   "took the one from May out; put new PD cath in" (01/19/2016)  . PERITONEAL CATHETER REMOVAL  11/03/2013; 02/11/2014   [name] 11/03/2013; Removal of tunneled catheter/notes 02/11/2014  . SALPINGOOPHORECTOMY Right    [name] 07/03/2013  . TEE WITHOUT CARDIOVERSION N/A 06/07/2016   Procedure: TRANSESOPHAGEAL ECHOCARDIOGRAM (TEE);  Surgeon: Chrystie Nose, MD;  Location[facility];  Service: Cardiovascular;  Laterality: N/A;  . TUBAL LIGATION      Social History Social History  Substance Use Topics  . Smoking status: Former Smoker    Packs/day: 0.75    Types: Cigarettes    Quit date: 07/01/2015  . Smokeless tobacco: Never Used  . Alcohol use No    Family History Family History  Problem Relation Age of Onset  . CAD Unknown   . Diabetes Unknown   . Bipolar disorder Unknown   . Cervical cancer Mother   . Other Father        heart bypass  . Breast cancer Neg Hx     Allergies  Allergen Reactions  . Cephalosporins Anaphylaxis    Patient has tolerated meropenem.   [name] Penicillins Anaphylaxis and Other (See Comments)    Has patient had a PCN reaction causing immediate rash, facial/tongue/throat swelling, SOB or lightheadedness with hypotension: Yes Has patient had a PCN reaction causing severe rash involving mucus membranes or skin necrosis: No Has patient had a PCN reaction that required hospitalization No Has patient had a PCN reaction occurring within  the last 10 years: No If all of the above answers are "NO", then may proceed with Cephalosporin use.  . Reglan [Metoclopramide] Other (See Comments)    Severe EPS (lip, head, body tremors) March 2018  . Risperdal [Risperidone] Other (See Comments)    Severe EPS (lip, head, body tremors) March 2018  . Lamictal [Lamotrigine] Other (See Comments)    Reaction:  Hallucinations  . Phenergan [Promethazine Hcl] Nausea And Vomiting    Sensitivity to medicine  . Pravastatin Other (See Comments)    Reaction:  Muscle pain   . Sulfa Antibiotics Other (See Comments)    Reaction:  Unknown      REVIEW OF SYSTEMS (Negative unless checked)  Constitutional: [] Weight loss  [] Fever  [] Chills Cardiac: [] Chest pain   [] Chest pressure   [] Palpitations   [] Shortness of breath when laying flat   [] Shortness of breath with exertion. Vascular:  [x] Pain in legs with walking   [] Pain in legs at rest  [] History of DVT   [] Phlebitis   [] Swelling in legs   [] Varicose veins   [] Non-healing  ulcers Pulmonary:   [] Uses home oxygen   [] Productive cough   [] Hemoptysis   [] Wheeze  [] COPD   [] Asthma Neurologic:  [] Dizziness   [] Seizures   [] History of stroke   [] History of TIA  [] Aphasia   [] Vissual changes   [] Weakness or numbness in arm   [] Weakness or numbness in leg Musculoskeletal:   [] Joint swelling   [] Joint pain   [] Low back pain Hematologic:  [] Easy bruising  [] Easy bleeding   [] Hypercoagulable state   [] Anemic Gastrointestinal:  [] Diarrhea   [] Vomiting  [] Gastroesophageal reflux/heartburn   [] Difficulty swallowing. Genitourinary:  [x] Chronic kidney disease   [] Difficult urination  [] Frequent urination   [] Blood in urine Skin:  [] Rashes   [] Ulcers  Psychological:  [] History of anxiety   []  History of major depression.  Physical Examination  There were no vitals filed for this visit. There is no height or weight on file to calculate BMI. Gen: WD/WN, NAD Head: [place]/AT, No temporalis wasting.  Ear/Nose/Throat:  Hearing grossly intact, nares w/o erythema or drainage Eyes: PER, EOMI, sclera nonicteric.  Neck: Supple, no large masses.   Pulmonary:  Good air movement, no audible wheezing bilaterally, no use of accessory muscles.  Cardiac: RRR, no JVD Vascular:  Right arm brachial cephalic fistula good thrill good bruit Vessel Right Left  Radial Palpable Palpable  Ulnar Palpable Palpable  Brachial Palpable Palpable  PT BKA Trace Palpable  DP BKA Trace Palpable  Gastrointestinal: Non-distended. No guarding/no peritoneal signs.  Musculoskeletal: M/S 5/5 throughout upper.  Right BKA.  Neurologic: CN 2-12 intact. Symmetrical.  Speech is fluent. Motor exam as listed above. Psychiatric: Judgment intact, Mood & affect appropriate for pt's clinical situation. Dermatologic: No rashes or ulcers noted.  No changes consistent with cellulitis. Lymph : No lichenification or skin changes of chronic lymphedema.  CBC Lab Results  Component Value Date   WBC 7.9 08/19/2016   HGB 11.2 (L) 08/23/2016   HCT 33.0 (L) 08/23/2016   MCV 88.3 08/19/2016   PLT 379 08/19/2016    BMET    Component Value Date/Time   NA 134 (L) 08/23/2016 0654   NA 136 (A) 03/27/2016   NA 136 07/17/2014 0417   K 4.1 08/23/2016 0654   K 3.5 07/17/2014 0417   CL 96 (L) 08/19/2016 1308   CL 103 07/17/2014 0417   CO2 27 08/19/2016 1308   CO2 26 07/17/2014 0417   GLUCOSE 141 (H) 08/23/2016 0654   GLUCOSE 221 (H) 07/17/2014 0417   BUN 28 (H) 08/19/2016 1308   BUN 46 (A) 03/27/2016   BUN 32 (H) 07/17/2014 0417   CREATININE 2.52 (H) 08/19/2016 1308   CREATININE 3.45 (H) 07/17/2014 0417   CALCIUM 9.2 08/19/2016 1308   CALCIUM 8.4 (L) 07/17/2014 0417   GFRNONAA 21 (L) 08/19/2016 1308   GFRNONAA 15 (L) 07/17/2014 0417   GFRAA 24 (L) 08/19/2016 1308   GFRAA 17 (L) 07/17/2014 0417   CrCl cannot be calculated (Patient's most recent lab result is older than the maximum 21 days allowed.).  COAG Lab Results  Component Value Date    INR 0.97 08/19/2016   INR 0.99 05/31/2016   INR 1.00 04/10/2016    Radiology No results found.  Assessment/Plan 1. ESRD on dialysis [facility](HCC) Recommend:  The patient is doing well and currently has adequate dialysis access. The patient's dialysis center is not reporting any access issues. Flow pattern is stable when compared to the prior ultrasound.  The patient should have a duplex ultrasound of the dialysis  access in 6 months. The patient will follow-up with me in the office after each ultrasound    2. Benign essential tremor I will ask neurology to see her  Given her concern regarding worsening of her tremor - Ambulatory referral to Neurology  3. Coronary artery disease involving native coronary artery of native heart without angina pectoris Continue cardiac and antihypertensive medications as already ordered and reviewed, no changes at this time.  Continue statin as ordered and reviewed, no changes at this time  Nitrates PRN for chest pain   4. Accelerated hypertension Continue antihypertensive medications as already ordered, these medications have been reviewed and there are no changes at this time.   5. Essential hypertension Continue antihypertensive medications as already ordered, these medications have been reviewed and there are no changes at this time.   6. Gastroesophageal reflux disease without esophagitis Continue PPI as already ordered, these medications have been reviewed and there are no changes at this time.    Levora Dredge, MD  01/29/2017 3:53 PM

## 2017-01-30 ENCOUNTER — Encounter (INDEPENDENT_AMBULATORY_CARE_PROVIDER_SITE_OTHER): Payer: Self-pay | Admitting: Vascular Surgery

## 2017-01-30 ENCOUNTER — Ambulatory Visit (INDEPENDENT_AMBULATORY_CARE_PROVIDER_SITE_OTHER): Payer: Medicare HMO | Admitting: Vascular Surgery

## 2017-01-30 VITALS — BP 183/70 | HR 68 | Resp 16 | Ht 69.0 in | Wt 240.3 lb

## 2017-01-30 DIAGNOSIS — N186 End stage renal disease: Secondary | ICD-10-CM | POA: Diagnosis not present

## 2017-01-30 DIAGNOSIS — I10 Essential (primary) hypertension: Secondary | ICD-10-CM | POA: Diagnosis not present

## 2017-01-30 DIAGNOSIS — G250 Essential tremor: Secondary | ICD-10-CM | POA: Diagnosis not present

## 2017-01-30 DIAGNOSIS — K219 Gastro-esophageal reflux disease without esophagitis: Secondary | ICD-10-CM

## 2017-01-30 DIAGNOSIS — I2510 Atherosclerotic heart disease of native coronary artery without angina pectoris: Secondary | ICD-10-CM

## 2017-01-30 DIAGNOSIS — Z992 Dependence on renal dialysis: Secondary | ICD-10-CM

## 2017-02-10 ENCOUNTER — Other Ambulatory Visit
Admission: RE | Admit: 2017-02-10 | Discharge: 2017-02-10 | Disposition: A | Discharge status: Home / Self Care | Payer: Medicare HMO | Source: Ambulatory Visit | Attending: Family Medicine | Admitting: Family Medicine

## 2017-02-10 DIAGNOSIS — B373 Candidiasis of vulva and vagina: Secondary | ICD-10-CM | POA: Diagnosis present

## 2017-02-10 LAB — WET PREP, GENITAL
Clue Cells Wet Prep HPF POC: NONE SEEN
Sperm: NONE SEEN
TRICH WET PREP: NONE SEEN
YEAST WET PREP: NONE SEEN

## 2017-02-13 ENCOUNTER — Telehealth: Payer: Self-pay | Admitting: Diagnostic Neuroimaging

## 2017-02-13 NOTE — Telephone Encounter (Signed)
I received a referral from Dr Levora Dredge for this pt to see you for benign essential tremor. The patient was just here on 01/07/17 and saw you for the same thing. Do we need to schedule another appt for this patient and when? dg

## 2017-02-13 NOTE — Telephone Encounter (Signed)
Please setup follow up appt with me or NP in next few weeks. This is an existing problem. -VRP

## 2017-02-14 NOTE — Telephone Encounter (Signed)
Made appt for pt on 02-25-17 at 1000 (be here 0930) for increased tremors (spoke with Associate Professor, Silva Bandy, at [employer]. ([facility] on MWF).  Dr. Gilda Crease [facility] referred back to [place].

## 2017-02-25 ENCOUNTER — Encounter: Payer: Self-pay | Admitting: Diagnostic Neuroimaging

## 2017-02-25 ENCOUNTER — Ambulatory Visit (INDEPENDENT_AMBULATORY_CARE_PROVIDER_SITE_OTHER): Payer: Medicare HMO | Admitting: Diagnostic Neuroimaging

## 2017-02-25 VITALS — BP 150/64 | HR 59 | Ht 69.0 in

## 2017-02-25 DIAGNOSIS — N186 End stage renal disease: Secondary | ICD-10-CM

## 2017-02-25 DIAGNOSIS — G2401 Drug induced subacute dyskinesia: Secondary | ICD-10-CM | POA: Diagnosis not present

## 2017-02-25 DIAGNOSIS — G251 Drug-induced tremor: Secondary | ICD-10-CM | POA: Diagnosis not present

## 2017-02-25 DIAGNOSIS — Z992 Dependence on renal dialysis: Secondary | ICD-10-CM

## 2017-02-25 DIAGNOSIS — G40909 Epilepsy, unspecified, not intractable, without status epilepticus: Secondary | ICD-10-CM | POA: Diagnosis not present

## 2017-02-25 NOTE — Patient Instructions (Signed)
TREMORS (tardive tremor / dyskinesia due to prior risperdal and reglan usage or current abilify usage; vs metabolic / ESRD; essential tremor less likely) - consider increasing clonazepam to 0.5mg  twice a day - consider slight decrease of gabapentin and abilify to see if tremors improve - then may consider valbenazine for treatment of tardive dyskinesia IF ABOVE SUGGESTIONS DO NOT HELP WITHIN NEXT 1-2 MONTHS  SEIZURE PREVENTION - continue levetiracetam 1500mg  daily

## 2017-02-25 NOTE — Progress Notes (Signed)
[facility]  PATIENT: Kimberly Montgomery DOB: 01/16/1966  REFERRING CLINICIAN:  HISTORY FROM: patient and chart review REASON FOR VISIT: follow up     HISTORICAL  CHIEF COMPLAINT:  Chief Complaint  Patient presents with  . Follow-up  . increased tremors    residing at [facility] rehab). Increased tremors in last 6 months (mouth and L hand).      HISTORY OF PRESENT ILLNESS:   UPDATE (02/25/17, VRP): Since last visit, doing worse with tremors and tardive dyskinesia. Having diff time with feeding herself. Tolerating levetiracetam for seizure prevention. No alleviating or aggravating factors.   UPDATE (01/07/17, VRP): Since last visit, doing well. No seizures. Tremors are slightly better now on klonopin. Also weaning off gabapentin to see if tremors get better. Tolerating LEV for seizure prevention. No alleviating or aggravating factors.   PRIOR HPI (06/17/16): 51 year old female with hypertension, end-stage renal disease, here for evaluation of seizures. Patient [facility] on 05/[facility]/16 for altered mental status, hypertensive emergency, seizure activity. Patient was treated with Keppra and seizure stopped. She was also having tremors, possibly extrapyramidal symptoms related to Reglan and Risperdal. Tremors gradually reduced with medication adjustment. Patient also had coag-negative methicillin-resistant staph bacteremia, felt to be contamination. Transthoracic and transesophageal echocardiogram were unremarkable. Since that time no further seizures. Patient tolerating medications. Patient living at [facility] skilled nursing facility in [place].    REVIEW OF SYSTEMS: Full 14 system review of systems performed and negative with exception of: only as per HPI.    ALLERGIES: Allergies  Allergen Reactions  . Cephalosporins Anaphylaxis    Patient has tolerated meropenem.   [name] Penicillins Anaphylaxis and Other (See Comments)   Has patient had a PCN reaction causing immediate rash, facial/tongue/throat swelling, SOB or lightheadedness with hypotension: Yes Has patient had a PCN reaction causing severe rash involving mucus membranes or skin necrosis: No Has patient had a PCN reaction that required hospitalization No Has patient had a PCN reaction occurring within the last 10 years: No If all of the above answers are "NO", then may proceed with Cephalosporin use.  . Reglan [Metoclopramide] Other (See Comments)    Severe EPS (lip, head, body tremors) March 2018  . Risperdal [Risperidone] Other (See Comments)    Severe EPS (lip, head, body tremors) March 2018  . Lamictal [Lamotrigine] Other (See Comments)    Reaction:  Hallucinations  . Phenergan [Promethazine Hcl] Nausea And Vomiting    Sensitivity to medicine  . Pravastatin Other (See Comments)    Reaction:  Muscle pain   . Sulfa Antibiotics Other (See Comments)    Reaction:  Unknown     HOME MEDICATIONS: Outpatient Medications Prior to Visit  Medication Sig Dispense Refill  . Amino Acids-Protein Hydrolys (FEEDING SUPPLEMENT, PRO-STAT SUGAR FREE 64,) LIQD Take 30 mLs by mouth 3 (three) times daily.     [name] amLODipine (NORVASC) 10 MG tablet Take 10 mg by mouth daily.     . ARIPiprazole (ABILIFY) 5 MG tablet Take 5 mg by mouth daily.     [name] aspirin 81 MG chewable tablet Chew 81 mg by mouth daily.    [name] atorvastatin (LIPITOR) 40 MG tablet Take 40 mg by mouth daily at 6 PM.    . bisacodyl (DULCOLAX) 10 MG suppository Place 10 mg rectally as needed for moderate constipation.    . carboxymethylcellulose (REFRESH) 1 % ophthalmic solution Place 1 drop into both eyes 3 (three) times daily.    [name]  clonazePAM (KLONOPIN) 0.5 MG tablet Take 0.5 mg by mouth at bedtime.    . clopidogrel (PLAVIX) 75 MG tablet Take 75 mg by mouth daily at 12 noon.     . fluticasone (FLONASE) 50 MCG/ACT nasal spray Place 1 spray into both nostrils daily.     [name] gabapentin (NEURONTIN) 300 MG capsule  Take 300 mg by mouth daily.     . hydrALAZINE (APRESOLINE) 50 MG tablet Take 1 tablet (50 mg total) by mouth 3 (three) times daily. (Patient taking differently: Take 75 mg by mouth 3 (three) times daily. )    . HYDROcodone-acetaminophen (NORCO) 5-325 MG tablet Take 1-2 tablets by mouth every 6 (six) hours as needed for moderate pain or severe pain. (Patient not taking: Reported on 01/07/2017) 50 tablet 0  . insulin aspart (NOVOLOG) 100 UNIT/ML injection Inject 8 Units into the skin 3 (three) times daily with meals.     . insulin detemir (LEVEMIR) 100 UNIT/ML injection Inject 6 Units into the skin at bedtime.     . isosorbide mononitrate (IMDUR) 30 MG 24 hr tablet Take 90 mg by mouth daily at 12 noon.     . lactulose (CHRONULAC) 10 GM/15ML solution Take 10 g by mouth daily.     [name] levETIRAcetam (KEPPRA) 750 MG tablet Take 1,500 mg by mouth daily at 12 noon.    . lidocaine (LIDODERM) 5 % Place 1 patch onto the skin at bedtime.     . lidocaine (LMX) 4 % cream Apply 1 application topically daily as needed (pain). To right arm fistula    . lisinopril (PRINIVIL,ZESTRIL) 10 MG tablet Take 1 tablet (10 mg total) by mouth daily.    . Melatonin 3 MG TABS Take 3 mg by mouth at bedtime.    . metoprolol (LOPRESSOR) 50 MG tablet Take 1 tablet (50 mg total) by mouth 2 (two) times daily.    . Multiple Vitamin (MULTIVITAMIN WITH MINERALS) TABS tablet Take 1 tablet by mouth daily.    . nitroGLYCERIN (NITROSTAT) 0.4 MG SL tablet Place 0.4 mg under the tongue every 5 (five) minutes as needed for chest pain. Reported on 07/10/2015    . omeprazole (PRILOSEC) 20 MG capsule Take 20 mg by mouth 2 (two) times daily.     . ondansetron (ZOFRAN ODT) 4 MG disintegrating tablet Take 1 tablet (4 mg total) by mouth every 6 (six) hours as needed for nausea or vomiting. (Patient taking differently: Take 4 mg by mouth every 8 (eight) hours as needed for nausea or vomiting. ) 20 tablet 0  . oxyCODONE-acetaminophen (PERCOCET/ROXICET) 5-325  MG tablet Take 1 tablet by mouth every 12 (twelve) hours as needed for severe pain. Do not exceed 4gm of Tylenol in 24 hours (Patient not taking: Reported on 01/07/2017) 10 tablet 0  . polyethylene glycol (MIRALAX / GLYCOLAX) packet Take 17 g by mouth daily. (Patient taking differently: Take 17 g by mouth daily at 12 noon. ) 14 each 0  . senna (SENOKOT) 8.6 MG TABS tablet Take 2 tablets by mouth 2 (two) times daily.     . sevelamer carbonate (RENVELA) 800 MG tablet Take 800 mg by mouth See admin instructions. Take 1 tablet four times a day. (with each meal and with bedtime snack.)    . torsemide (DEMADEX) 100 MG tablet Take 100 mg by mouth daily.      No facility-administered medications prior to visit.     PAST MEDICAL HISTORY: Past Medical History:  Diagnosis Date  . Anginal  pain (HCC)   . Anxiety   . Bipolar disorder (HCC)   . CAD (coronary artery disease)   . CHF (congestive heart failure) (HCC)   . Chronic lower back pain   . Depression   . Endometriosis   . ESRD (end stage renal disease) on dialysis (HCC)    "[facility]; [address]; [place]; TTS" (01/19/2016)  . Gastroparesis   . GERD (gastroesophageal reflux disease)   . History of blood transfusion "several"   "my blood would get low; low RBC"  . History of hiatal hernia   . HLD (hyperlipidemia)   . Hypertension   . Migraine    "monthly" (01/19/2016)  . Myocardial infarction (HCC) 2017   "~ 3 wks ago" (01/19/2016)  . Renal disorder   . Renal insufficiency   . Seizures (HCC) 07/2015   "I've only had the 1; don't know what from" (01/19/2016)  . Type II diabetes mellitus (HCC)     PAST SURGICAL HISTORY: Past Surgical History:  Procedure Laterality Date  . ABDOMINAL HYSTERECTOMY     "partial"  . AV FISTULA PLACEMENT Right 08/23/2016   Procedure: ARTERIOVENOUS (AV) FISTULA CREATION  ( BRACHIAL CEPHALIC );  Surgeon: Renford DillsSchnier, Gregory G, MD;  Location: [facility] ORS;  Service: Vascular;  Laterality: Right;  . BELOW KNEE LEG  AMPUTATION Right 2010?  [name]. CARDIAC CATHETERIZATION Right 07/10/2015   Procedure: Left Heart Cath and Coronary Angiography;  Surgeon: Laurier NancyShaukat A Khan, MD;  Location: [facility] INVASIVE CV LAB;  Service: Cardiovascular;  Laterality: Right;  . CARDIAC CATHETERIZATION Right 09/14/2015   Procedure: Left Heart Cath and Coronary Angiography;  Surgeon: Laurier NancyShaukat A Khan, MD;  Location: [facility] INVASIVE CV LAB;  Service: Cardiovascular;  Laterality: Right;  . CARDIAC CATHETERIZATION N/A 09/14/2015   Procedure: Coronary Stent Intervention;  Surgeon: Alwyn Peawayne D Callwood, MD;  Location: [facility] INVASIVE CV LAB;  Service: Cardiovascular;  Laterality: N/A;  . CARDIAC CATHETERIZATION Right 11/20/2015   Procedure: Left Heart Cath and Coronary Angiography;  Surgeon: Laurier NancyShaukat A Khan, MD;  Location: [facility] INVASIVE CV LAB;  Service: Cardiovascular;  Laterality: Right;  . CATARACT EXTRACTION, BILATERAL    . CORONARY ANGIOPLASTY WITH STENT PLACEMENT  <2017   @ [facility]/notes 07/03/2013  . CORONARY ANGIOPLASTY WITH STENT PLACEMENT    . DIALYSIS/PERMA CATHETER REMOVAL N/A 11/12/2016   Procedure: DIALYSIS/PERMA CATHETER REMOVAL;  Surgeon: Renford DillsSchnier, Gregory G, MD;  Location: [facility] INVASIVE CV LAB;  Service: Cardiovascular;  Laterality: N/A;  . HYSTEROTOMY    . PERIPHERAL VASCULAR CATHETERIZATION N/A 08/30/2015   Procedure: Dialysis/Perma Catheter Insertion;  Surgeon: Annice NeedyJason S Dew, MD;  Location: [facility] INVASIVE CV LAB;  Service: Cardiovascular;  Laterality: N/A;  . PERIPHERAL VASCULAR CATHETERIZATION N/A 01/01/2016   Procedure: Dialysis/Perma Catheter Insertion;  Surgeon: Annice NeedyJason S Dew, MD;  Location: [facility] INVAS[facility] CV LAB;  Service: Cardiovascular;  Laterality: N/A;  . PERITONEAL CATHETER INSERTION  11/03/2013   [name]/notes 11/03/2013  . PERITONEAL CATHETER REMOVAL  01/01/2016   "took the one from May out; put new PD cath in" (01/19/2016)  . PERITONEAL CATHETER REMOVAL  11/03/2013; 02/11/2014   [name]/notes 11/03/2013; Removal of tunneled catheter/notes 02/11/2014  .  SALPINGOOPHORECTOMY Right    [name]/notes 07/03/2013  . TEE WITHOUT CARDIOVERSION N/A 06/07/2016   Procedure: TRANSESOPHAGEAL ECHOCARDIOGRAM (TEE);  Surgeon: Chrystie NoseKenneth C Hilty, MD;  [facility];  Service: Cardiovascular;  Laterality: N/A;  . TUBAL LIGATION      FAMILY HISTORY: Family History  Problem Relation Age of Onset  . CAD Unknown   . Diabetes Unknown   .  Bipolar disorder Unknown   . Cervical cancer Mother   . Other Father        heart bypass  . Breast cancer Neg Hx     SOCIAL HISTORY:  Social History   Socioeconomic History  . Marital status: Widowed    Spouse name: Not on file  . Number of children: 2  . Years of education: Not on file  . Highest education level: Not on file  Social Needs  . Financial resource strain: Not on file  . Food insecurity - worry: Not on file  . Food insecurity - inability: Not on file  . Transportation needs - medical: Not on file  . Transportation needs - non-medical: Not on file  Occupational History  . Occupation: disabled  Tobacco Use  . Smoking status: Former Smoker    Packs/day: 0.75    Types: Cigarettes    Last attempt to quit: 07/01/2015    Years since quitting: 1.6  . Smokeless tobacco: Never Used  Substance and Sexual Activity  . Alcohol use: No    Alcohol/week: 0.0 oz  . Drug use: No  . Sexual activity: Not Currently  Other Topics Concern  . Not on file  Social History Narrative   06/17/16 currently at [facility]     PHYSICAL EXAM  GENERAL EXAM/CONSTITUTIONAL: Vitals:  Vitals:   02/25/17 1045  BP: (!) 150/64  Pulse: (!) 59  Height: 5\' 9"  (1.753 m)   Body mass index is 35.49 kg/m. No exam data present  Patient is in no distress; well developed, nourished and groomed; neck is supple  SLOW MOVEMENTS THROUGHTOUT  CARDIOVASCULAR:  Examination of carotid arteries is normal; no carotid bruits  Regular rate and rhythm, no murmurs  Examination of peripheral vascular system by observation and  palpation is normal  EYES:  Ophthalmoscopic exam of optic discs and posterior segments is normal; no papilledema or hemorrhages  MUSCULOSKELETAL:  Gait, strength, tone, movements noted in Neurologic exam below  NEUROLOGIC: MENTAL STATUS:  No flowsheet data found.  awake, alert, oriented to person, place and time  recent and remote memory intact  normal attention and concentration  language fluent, comprehension intact, naming intact,   fund of knowledge appropriate  CRANIAL NERVE:   2nd - no papilledema on fundoscopic exam  2nd, 3rd, 4th, 6th - pupils equal and reactive to light, visual fields full to confrontation, extraocular muscles --> END GAZE NYSTAGMUS IN ALL DIRECTIONS  5th - facial sensation symmetric  7th - facial strength symmetric  8th - hearing intact  9th - palate elevates symmetrically, uvula midline  11th - shoulder shrug symmetric  12th - tongue protrusion midline  RESTING HEAD TREMOR AND MOUTH DYSKINESIAS  MOTOR:   FINE POSTURAL TREMOR IN BUE  MILD REST TREMOR IN LUE > RUE  BRADYKINESIA IN BUE  normal bulk and tone, full strength in the BUE, LLE  RIGHT BELOW KNEE AMPUTATION  SENSORY:   normal and symmetric to light touch, temperature, vibration  EXCEPT ABSENT VIB IN LEFT FOOT AND KNEE  COORDINATION:   finger-nose-finger, fine finger movements SLOW  REFLEXES:   deep tendon reflexes TRACE and symmetric  RIGHT BELOW KNEE AMPUTATION  GAIT/STATION:   IN WHEELCHAIR; RIGHT BELOW KNEE AMPUTATION; CANNOT STAND UP    DIAGNOSTIC DATA (LABS, IMAGING, TESTING) - I reviewed patient records, labs, notes, testing and imaging myself where available.  Lab Results  Component Value Date   WBC 7.9 08/19/2016   HGB 11.2 (L) 08/23/2016  HCT 33.0 (L) 08/23/2016   MCV 88.3 08/19/2016   PLT 379 08/19/2016      Component Value Date/Time   NA 134 (L) 08/23/2016 0654   NA 136 (A) 03/27/2016   NA 136 07/17/2014 0417   K 4.1 08/23/2016  0654   K 3.5 07/17/2014 0417   CL 96 (L) 08/19/2016 1308   CL 103 07/17/2014 0417   CO2 27 08/19/2016 1308   CO2 26 07/17/2014 0417   GLUCOSE 141 (H) 08/23/2016 0654   GLUCOSE 221 (H) 07/17/2014 0417   BUN 28 (H) 08/19/2016 1308   BUN 46 (A) 03/27/2016   BUN 32 (H) 07/17/2014 0417   CREATININE 2.52 (H) 08/19/2016 1308   CREATININE 3.45 (H) 07/17/2014 0417   CALCIUM 9.2 08/19/2016 1308   CALCIUM 8.4 (L) 07/17/2014 0417   PROT 6.4 (L) 07/29/2016 1025   PROT 5.7 (L) 07/16/2014 2132   ALBUMIN 3.4 (L) 07/29/2016 1025   ALBUMIN 2.6 (L) 07/16/2014 2132   AST 16 07/29/2016 1025   AST 14 (L) 07/16/2014 2132   ALT 11 (L) 07/29/2016 1025   ALT 9 (L) 07/16/2014 2132   ALKPHOS 97 07/29/2016 1025   ALKPHOS 104 07/16/2014 2132   BILITOT 0.5 07/29/2016 1025   BILITOT 0.4 07/16/2014 2132   GFRNONAA 21 (L) 08/19/2016 1308   GFRNONAA 15 (L) 07/17/2014 0417   GFRAA 24 (L) 08/19/2016 1308   GFRAA 17 (L) 07/17/2014 0417   Lab Results  Component Value Date   CHOL 215 (H) 11/19/2015   HDL 63 11/19/2015   LDLCALC 115 (H) 11/19/2015   TRIG 186 (H) 11/19/2015   CHOLHDL 3.4 11/19/2015   Lab Results  Component Value Date   HGBA1C 6.2 (H) 09/13/2015   Lab Results  Component Value Date   VITAMINB12 635 06/06/2016   Lab Results  Component Value Date   TSH 2.417 06/01/2016    05/31/16 MRI brain 1. No acute intracranial abnormality to explain 7 not altered mental status. 2. Remote lacunar infarcts within the posterior circulation as described. 3. Mild generalized atrophy and white matter disease is advanced for age.  06/02/16 continuous VEEG - This is an abnormal EEGdue to the presence of the following: 1) Generalized polymorphic delta slowing; 2) Intermittent triphasic waves. Taken together, these results suggest severe encephalopathy, most likely due to toxic-metabolic etiology, but medication effect could not be excluded.No epileptiform discharges or seizures were present.        ASSESSMENT AND PLAN  51 y.o. year old female here with end-stage renal disease on hemodialysis, presenting with hypertensive emergency, tremors and seizures. Now on antiseizure medication and stable. Extrapyramidal symptoms and tremors have subsided.   Dx:  1. Tardive dyskinesia   2. Medication-induced postural tremor   3. ESRD on dialysis (HCC)   4. Seizure disorder (HCC)      PLAN:  TREMORS (tardive tremor / dyskinesia due to prior risperdal and reglan usage or current abilify usage; vs metabolic / ESRD; essential tremor less likely) - consider increasing clonazepam to 0.5mg  twice a day - consider slight decrease of gabapentin and abilify to see if tremors improve - then may consider valbenazine for treatment of tardive dyskinesia IF ABOVE SUGGESTIONS DO NOT HELP WITHIN NEXT 1-2 MONTHS  SEIZURE PREVENTION - continue levetiracetam 1500mg  daily  Return in about 6 months (around 08/25/2017).    Suanne Marker, MD 02/25/2017, 10:58 AM Certified in Neurology, Neurophysiology and Neuroimaging  [facility] [address], [address] [place], [place] [address] [phone]  [phone]  

## 2017-03-28 ENCOUNTER — Emergency Department: Payer: Medicare HMO

## 2017-03-28 ENCOUNTER — Encounter: Payer: Self-pay | Admitting: Intensive Care

## 2017-03-28 ENCOUNTER — Emergency Department
Admission: EM | Admit: 2017-03-28 | Discharge: 2017-03-28 | Disposition: A | Discharge status: Home / Self Care | Payer: Medicare HMO | Attending: Emergency Medicine | Admitting: Emergency Medicine

## 2017-03-28 DIAGNOSIS — Z79899 Other long term (current) drug therapy: Secondary | ICD-10-CM | POA: Insufficient documentation

## 2017-03-28 DIAGNOSIS — Z7982 Long term (current) use of aspirin: Secondary | ICD-10-CM | POA: Diagnosis not present

## 2017-03-28 DIAGNOSIS — I2510 Atherosclerotic heart disease of native coronary artery without angina pectoris: Secondary | ICD-10-CM | POA: Diagnosis not present

## 2017-03-28 DIAGNOSIS — N186 End stage renal disease: Secondary | ICD-10-CM | POA: Diagnosis not present

## 2017-03-28 DIAGNOSIS — E1122 Type 2 diabetes mellitus with diabetic chronic kidney disease: Secondary | ICD-10-CM | POA: Diagnosis not present

## 2017-03-28 DIAGNOSIS — Z992 Dependence on renal dialysis: Secondary | ICD-10-CM | POA: Insufficient documentation

## 2017-03-28 DIAGNOSIS — R1013 Epigastric pain: Secondary | ICD-10-CM | POA: Diagnosis not present

## 2017-03-28 DIAGNOSIS — I132 Hypertensive heart and chronic kidney disease with heart failure and with stage 5 chronic kidney disease, or end stage renal disease: Secondary | ICD-10-CM | POA: Diagnosis not present

## 2017-03-28 DIAGNOSIS — Z794 Long term (current) use of insulin: Secondary | ICD-10-CM | POA: Diagnosis not present

## 2017-03-28 DIAGNOSIS — G8929 Other chronic pain: Secondary | ICD-10-CM | POA: Diagnosis not present

## 2017-03-28 DIAGNOSIS — R079 Chest pain, unspecified: Secondary | ICD-10-CM | POA: Diagnosis not present

## 2017-03-28 DIAGNOSIS — Z87891 Personal history of nicotine dependence: Secondary | ICD-10-CM | POA: Insufficient documentation

## 2017-03-28 DIAGNOSIS — I5032 Chronic diastolic (congestive) heart failure: Secondary | ICD-10-CM | POA: Insufficient documentation

## 2017-03-28 DIAGNOSIS — R109 Unspecified abdominal pain: Secondary | ICD-10-CM

## 2017-03-28 DIAGNOSIS — Z7902 Long term (current) use of antithrombotics/antiplatelets: Secondary | ICD-10-CM | POA: Diagnosis not present

## 2017-03-28 DIAGNOSIS — I252 Old myocardial infarction: Secondary | ICD-10-CM | POA: Insufficient documentation

## 2017-03-28 LAB — CBC
HEMATOCRIT: 33.3 % — AB (ref 35.0–47.0)
HEMOGLOBIN: 11 g/dL — AB (ref 12.0–16.0)
MCH: 29.8 pg (ref 26.0–34.0)
MCHC: 33.1 g/dL (ref 32.0–36.0)
MCV: 89.9 fL (ref 80.0–100.0)
Platelets: 260 10*3/uL (ref 150–440)
RBC: 3.7 MIL/uL — AB (ref 3.80–5.20)
RDW: 13.8 % (ref 11.5–14.5)
WBC: 6.2 10*3/uL (ref 3.6–11.0)

## 2017-03-28 LAB — BASIC METABOLIC PANEL
ANION GAP: 7 (ref 5–15)
BUN: 27 mg/dL — ABNORMAL HIGH (ref 6–20)
CHLORIDE: 97 mmol/L — AB (ref 101–111)
CO2: 31 mmol/L (ref 22–32)
CREATININE: 3.52 mg/dL — AB (ref 0.44–1.00)
Calcium: 8.3 mg/dL — ABNORMAL LOW (ref 8.9–10.3)
GFR calc non Af Amer: 14 mL/min — ABNORMAL LOW (ref >60)
GFR, EST AFRICAN AMERICAN: 16 mL/min — AB (ref >60)
Glucose, Bld: 154 mg/dL — ABNORMAL HIGH (ref 65–99)
POTASSIUM: 4 mmol/L (ref 3.5–5.1)
Sodium: 135 mmol/L (ref 135–145)

## 2017-03-28 LAB — LIPASE, BLOOD: Lipase: 23 U/L (ref 11–51)

## 2017-03-28 LAB — HEPATIC FUNCTION PANEL
ALBUMIN: 3.2 g/dL — AB (ref 3.5–5.0)
ALK PHOS: 147 U/L — AB (ref 38–126)
ALT: 11 U/L — AB (ref 14–54)
AST: 18 U/L (ref 15–41)
BILIRUBIN TOTAL: 0.5 mg/dL (ref 0.3–1.2)
Total Protein: 6.4 g/dL — ABNORMAL LOW (ref 6.5–8.1)

## 2017-03-28 LAB — TROPONIN I
Troponin I: <0.03 ng/mL (ref <0.03)
Troponin I: <0.03 ng/mL (ref <0.03)

## 2017-03-28 MED ORDER — LORAZEPAM 1 MG PO TABS
1.0000 mg | ORAL_TABLET | Freq: Once | ORAL | Status: AC
  Administered 2017-03-28: 1 mg via ORAL
  Filled 2017-03-28: qty 1

## 2017-03-28 MED ORDER — PANTOPRAZOLE SODIUM 40 MG PO TBEC
40.0000 mg | DELAYED_RELEASE_TABLET | Freq: Once | ORAL | Status: AC
  Administered 2017-03-28: 40 mg via ORAL
  Filled 2017-03-28: qty 1

## 2017-03-28 MED ORDER — ASPIRIN 81 MG PO CHEW: 324.0000 mg | CHEWABLE_TABLET | Freq: Once | ORAL | Status: DC

## 2017-03-28 MED ORDER — GI COCKTAIL ~~LOC~~
30.0000 mL | Freq: Once | ORAL | Status: AC
  Administered 2017-03-28: 30 mL via ORAL
  Filled 2017-03-28: qty 30

## 2017-03-28 MED ORDER — MORPHINE SULFATE (PF) 4 MG/ML IV SOLN: 4.0000 mg | Freq: Once | INTRAVENOUS | Status: DC

## 2017-03-28 MED ORDER — HYDROCODONE-ACETAMINOPHEN 5-325 MG PO TABS
1.0000 | ORAL_TABLET | Freq: Once | ORAL | Status: AC
  Administered 2017-03-28: 1 via ORAL
  Filled 2017-03-28: qty 1

## 2017-03-28 NOTE — ED Provider Notes (Addendum)
[facility] Emergency Department Provider Note  ____________________________________________   I have reviewed the triage vital signs and the nursing notes. Where available I have reviewed prior notes and, if possible and indicated, outside hospital notes.    HISTORY  Chief Complaint Chest Pain    HPI Kimberly Montgomery is a 51 y.o. female with a history of anxiety, bipolar disorder, CAD CHF, chronic low back pain, end-stage renal disorder on dialysis Monday Wednesday and Friday, hypertension, benign essential tremor, reflux disease, endometriosis, recent heart cath appears to have been in August 2017 at which time she had patent stent in the LAD and mild to moderate disease in the distal LAD, patent stent in the right coronary.  Patient was feeling a little unwell with a mild stomachache and incipient URI symptoms this morning, no fever no chills and she went to dialysis.  She had full dialysis or nearly, but towards the end she began to have epigastric abdominal pain that radiated up towards her chest.  She denies any fever or chills or vomiting or hematemesis melena bright red blood per rectum.  The pain is a permanent burning last pressure discomfort.   Location: Patient indicates epigastric region Radiation: Goes upper esophagus region towards her chest she states Quality: Burning/pressure Duration: Happened at dialysis immediately prior to arrival Timing: At rest Severity: Mild Associated sxs: Burping URI symptoms PriorTreatment : None   Past Medical History:  Diagnosis Date  . Anginal pain (HCC)   . Anxiety   . Bipolar disorder (HCC)   . CAD (coronary artery disease)   . CHF (congestive heart failure) (HCC)   . Chronic lower back pain   . Depression   . Endometriosis   . ESRD (end stage renal disease) on dialysis (HCC)    "[facility]; [address]; [place]; TTS" (01/19/2016)  . Gastroparesis   . GERD (gastroesophageal reflux disease)   . History of  blood transfusion "several"   "my blood would get low; low RBC"  . History of hiatal hernia   . HLD (hyperlipidemia)   . Hypertension   . Migraine    "monthly" (01/19/2016)  . Myocardial infarction (HCC) 2017   "~ 3 wks ago" (01/19/2016)  . Renal disorder   . Renal insufficiency   . Seizures (HCC) 07/2015   "I've only had the 1; don't know what from" (01/19/2016)  . Type II diabetes mellitus [facility])     Patient Active Problem List   Diagnosis Date Noted  . Benign essential tremor 01/30/2017  . Seizure (HCC)   . Acute encephalopathy   . Tremor   . Cerebrovascular disease   . Complication from renal dialysis device 04/08/2016  . Colitis 03/13/2016  . Chronic diastolic congestive heart failure (HCC) 01/22/2016  . Pressure injury of skin 01/20/2016  . Bacteremia, coagulase-negative staphylococcal 12/27/2015  . Essential hypertension 11/21/2015  . Anemia of chronic disease 11/21/2015  . Acute respiratory failure with hypoxia (HCC) 11/21/2015  . ESRD on dialysis (HCC) 11/21/2015  . 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 NSTEMI (non-ST elevated myocardial infarction) (HCC) 11/20/2015  . Chest pain, rule out acute myocardial infarction 11/18/2015  . Bipolar I disorder, most recent episode depressed (HCC)   . Altered mental status 08/24/2015  . Ileus (HCC)   . Bipolar I disorder (HCC) 07/25/2015  . Seizures (HCC) 07/25/2015  . Peritonitis (HCC) 07/16/2015  . Unstable angina (HCC) 07/09/2015  . Accelerated hypertension 07/09/2015  . Type 2 diabetes mellitus (HCC) 07/09/2015  . CAD (coronary artery disease)  07/09/2015  . HLD (hyperlipidemia) 07/09/2015  . GERD (gastroesophageal reflux disease) 07/09/2015    Past Surgical History:  Procedure Laterality Date  . ABDOMINAL HYSTERECTOMY     "partial"  . AV FISTULA PLACEMENT Right 08/23/2016   Procedure: ARTERIOVENOUS (AV) FISTULA CREATION  ( BRACHIAL CEPHALIC );  Surgeon: Renford Dills, MD;  Location: [facility] ORS;  Service: Vascular;   Laterality: Right;  . BELOW KNEE LEG AMPUTATION Right 2010?  [name] CARDIAC CATHETERIZATION Right 07/10/2015   Procedure: Left Heart Cath and Coronary Angiography;  Surgeon: Laurier Nancy, MD;  Location: [facility] INVASIVE CV LAB;  Service: Cardiovascular;  Laterality: Right;  . CARDIAC CATHETERIZATION Right 09/14/2015   Procedure: Left Heart Cath and Coronary Angiography;  Surgeon: Laurier Nancy, MD;  Location: [facility] INVASIVE CV LAB;  Service: Cardiovascular;  Laterality: Right;  . CARDIAC CATHETERIZATION N/A 09/14/2015   Procedure: Coronary Stent Intervention;  Surgeon: Alwyn Pea, MD;  Location: [facility] INVASIVE CV LAB;  Service: Cardiovascular;  Laterality: N/A;  . CARDIAC CATHETERIZATION Right 11/20/2015   Procedure: Left Heart Cath and Coronary Angiography;  Surgeon: Laurier Nancy, MD;  Location: [facility] INVASIVE CV LAB;  Service: Cardiovascular;  Laterality: Right;  . CATARACT EXTRACTION, BILATERAL    . CORONARY ANGIOPLASTY WITH STENT PLACEMENT  <2017   @ [facility]/notes 07/03/2013  . CORONARY ANGIOPLASTY WITH STENT PLACEMENT    . DIALYSIS/PERMA CATHETER REMOVAL N/A 11/12/2016   Procedure: DIALYSIS/PERMA CATHETER REMOVAL;  Surgeon: Renford Dills, MD;  Location: [facility] INVASIVE CV LAB;  Service: Cardiovascular;  Laterality: N/A;  . HYSTEROTOMY    . PERIPHERAL VASCULAR CATHETERIZATION N/A 08/30/2015   Procedure: Dialysis/Perma Catheter Insertion;  Surgeon: Annice Needy, MD;  Location: [facility] INVASIVE CV LAB;  Service: Cardiovascular;  Laterality: N/A;  . PERIPHERAL VASCULAR CATHETERIZATION N/A 01/01/2016   Procedure: Dialysis/Perma Catheter Insertion;  Surgeon: Annice Needy, MD;  Location: [facility] INVASIVE CV LAB;  Service: Cardiovascular;  Laterality: N/A;  . PERITONEAL CATHETER INSERTION  11/03/2013   [name] 11/03/2013  . PERITONEAL CATHETER REMOVAL  01/01/2016   "took the one from May out; put new PD cath in" (01/19/2016)  . PERITONEAL CATHETER REMOVAL  11/03/2013; 02/11/2014   [name] 11/03/2013; Removal of  tunneled catheter/notes 02/11/2014  . SALPINGOOPHORECTOMY Right    [name] 07/03/2013  . TEE WITHOUT CARDIOVERSION N/A 06/07/2016   Procedure: TRANSESOPHAGEAL ECHOCARDIOGRAM (TEE);  Surgeon: Chrystie Nose, MD;  Location: [facility] ENDOSCOPY;  Service: Cardiovascular;  Laterality: N/A;  . TUBAL LIGATION      Prior to Admission medications   Medication Sig Start Date End Date Taking? Authorizing Provider  Amino Acids-Protein Hydrolys (FEEDING SUPPLEMENT, PRO-STAT SUGAR FREE 64,) LIQD Take 30 mLs by mouth 3 (three) times daily.     [provider]  amLODipine (NORVASC) 10 MG tablet Take 10 mg by mouth daily.     [provider]  ARIPiprazole (ABILIFY) 5 MG tablet Take 5 mg by mouth daily.     [provider]  aspirin 81 MG chewable tablet Chew 81 mg by mouth daily.    [provider]  atorvastatin (LIPITOR) 40 MG tablet Take 40 mg by mouth daily at 6 PM.    [provider]  bisacodyl (DULCOLAX) 10 MG suppository Place 10 mg rectally as needed for moderate constipation.    [provider]  carboxymethylcellulose (REFRESH) 1 % ophthalmic solution Place 1 drop into both eyes 3 (three) times daily.    [provider]  clonazePAM (KLONOPIN) 0.5 MG tablet Take  0.5 mg by mouth at bedtime.    [provider]  clopidogrel (PLAVIX) 75 MG tablet Take 75 mg by mouth daily at 12 noon.     [provider]  fluticasone (FLONASE) 50 MCG/ACT nasal spray Place 1 spray into both nostrils daily.     [provider]  gabapentin (NEURONTIN) 300 MG capsule Take 300 mg by mouth daily.     [provider]  hydrALAZINE (APRESOLINE) 50 MG tablet Take 1 tablet (50 mg total) by mouth 3 (three) times daily. Patient taking differently: Take 75 mg by mouth 3 (three) times daily.  06/09/16   Leroy Sea, MD  HYDROcodone-acetaminophen (NORCO) 5-325 MG tablet Take 1-2 tablets by mouth every 6 (six) hours as needed for moderate pain or  severe pain. 08/23/16   Schnier, Latina Craver, MD  insulin aspart (NOVOLOG) 100 UNIT/ML injection Inject 8 Units into the skin 3 (three) times daily with meals.     [provider]  insulin detemir (LEVEMIR) 100 UNIT/ML injection Inject 6 Units into the skin at bedtime.     [provider]  isosorbide mononitrate (IMDUR) 30 MG 24 hr tablet Take 90 mg by mouth daily at 12 noon.     [provider]  lactulose (CHRONULAC) 10 GM/15ML solution Take 10 g by mouth daily.     [provider]  levETIRAcetam (KEPPRA) 750 MG tablet Take 1,500 mg by mouth daily at 12 noon.    [provider]  lidocaine (LIDODERM) 5 % Place 1 patch onto the skin at bedtime.     [provider]  lidocaine (LMX) 4 % cream Apply 1 application topically daily as needed (pain). To right arm fistula    [provider]  lisinopril (PRINIVIL,ZESTRIL) 10 MG tablet Take 1 tablet (10 mg total) by mouth daily. 01/21/16   Vassie Loll, MD  Melatonin 3 MG TABS Take 3 mg by mouth at bedtime.    [provider]  metoprolol (LOPRESSOR) 50 MG tablet Take 1 tablet (50 mg total) by mouth 2 (two) times daily. 09/15/15   Milagros Loll, MD  Multiple Vitamin (MULTIVITAMIN WITH MINERALS) TABS tablet Take 1 tablet by mouth daily.    [provider]  nitroGLYCERIN (NITROSTAT) 0.4 MG SL tablet Place 0.4 mg under the tongue every 5 (five) minutes as needed for chest pain. Reported on 07/10/2015    [provider]  omeprazole (PRILOSEC) 20 MG capsule Take 20 mg by mouth 2 (two) times daily.     [provider]  ondansetron (ZOFRAN ODT) 4 MG disintegrating tablet Take 1 tablet (4 mg total) by mouth every 6 (six) hours as needed for nausea or vomiting. Patient taking differently: Take 4 mg by mouth every 8 (eight) hours as needed for nausea or vomiting.  07/16/15   Sharyn Creamer, MD  oxyCODONE-acetaminophen (PERCOCET/ROXICET) 5-325 MG tablet Take 1 tablet by mouth every  12 (twelve) hours as needed for severe pain. Do not exceed 4gm of Tylenol in 24 hours Patient not taking: Reported on 01/07/2017 06/09/16   Leroy Sea, MD  polyethylene glycol (MIRALAX / GLYCOLAX) packet Take 17 g by mouth daily. Patient taking differently: Take 17 g by mouth daily at 12 noon.  08/02/15   Adrian Saran, MD  primidone (MYSOLINE) 50 MG tablet Take 100 mg by mouth at bedtime.    [provider]  senna (SENOKOT) 8.6 MG TABS tablet Take 2 tablets by mouth 2 (two) times daily.  [provider]  sevelamer carbonate (RENVELA) 800 MG tablet Take 800 mg by mouth See admin instructions. Take 1 tablet four times a day. (with each meal and with bedtime snack.)    [provider]  torsemide (DEMADEX) 100 MG tablet Take 100 mg by mouth daily.     [provider]    Allergies Cephalosporins; Penicillins; Reglan [metoclopramide]; Risperdal [risperidone]; Lamictal [lamotrigine]; Phenergan [promethazine hcl]; Pravastatin; and Sulfa antibiotics  Family History  Problem Relation Age of Onset  . CAD Unknown   . Diabetes Unknown   . Bipolar disorder Unknown   . Cervical cancer Mother   . Other Father        heart bypass  . Breast cancer Neg Hx     Social History Social History   Tobacco Use  . Smoking status: Former Smoker    Packs/day: 0.75    Types: Cigarettes    Last attempt to quit: 07/01/2015    Years since quitting: 1.7  . Smokeless tobacco: Never Used  Substance Use Topics  . Alcohol use: No    Alcohol/week: 0.0 oz  . Drug use: No    Review of Systems Constitutional: No fever/chills Eyes: No visual changes. ENT: No sore throat. No stiff neck no neck pain Cardiovascular: See HPI Respiratory: Denies shortness of breath. Gastrointestinal:   no vomiting.  No diarrhea.  No constipation. Genitourinary: Negative for dysuria. Musculoskeletal: Negative lower extremity swelling Skin: Negative for rash. Neurological: Negative for severe  headaches, focal weakness or numbness.   ____________________________________________   PHYSICAL EXAM:  VITAL SIGNS: ED Triage Vitals [03/28/17 1050]  Enc Vitals Group     BP      Pulse      Resp      Temp      Temp src      SpO2      Weight 228 lb 2.8 oz (103.5 kg)     Height 5\' 9"  (1.753 m)     Head Circumference      [facility] Flow      Pain Score 7     Pain Loc      Pain Edu?      Excl. in GC?     Constitutional: Alert and oriented. Well appearing and in no acute distress. Eyes: Conjunctivae are normal Head: Atraumatic HEENT: No congestion/rhinnorhea. Mucous membranes are moist.  Oropharynx non-erythematous Neck:   Nontender with no meningismus, no masses, no stridor Cardiovascular: Normal rate, regular rhythm. Grossly normal heart sounds.  Good peripheral circulation. Respiratory: Normal respiratory effort.  No retractions. Lungs CTAB. Abdominal: Soft and minimal epigastric discomfort which reproduces her pain. No distention. No guarding no rebound Back:  There is no focal tenderness or step off.  there is no midline tenderness there are no lesions noted. there is no CVA tenderness Musculoskeletal: No lower extremity tenderness, no upper extremity tenderness. No joint effusions, no DVT signs strong distal pulses no edema Neurologic:  Normal speech and language. No gross focal neurologic deficits are appreciated.  Skin:  Skin is warm, dry and intact. No rash noted. Psychiatric: Mood and affect are normal. Speech and behavior are normal.  ____________________________________________   LABS (all labs ordered are listed, but only abnormal results are displayed)  Labs Reviewed  CBC - Abnormal; Notable for the following components:      Result Value   RBC 3.70 (*)    Hemoglobin 11.0 (*)    HCT 33.3 (*)    All other components within normal  limits  BASIC METABOLIC PANEL  TROPONIN I  HEPATIC FUNCTION PANEL  LIPASE, BLOOD    Pertinent labs  results that were  available during my care of the patient were reviewed by me and considered in my medical decision making (see chart for details). ____________________________________________  EKG  I personally interpreted any EKGs ordered by me or triage Sinus rhythm rate 73 bpm no acute ST elevation no acute ST depression, normal axis, no acute ischemic changes noted ____________________________________________  RADIOLOGY  Pertinent labs & imaging results that were available during my care of the patient were reviewed by me and considered in my medical decision making (see chart for details). If possible, patient and/or family made aware of any abnormal findings.  Dg Chest 2 View  Result Date: 03/28/2017 CLINICAL DATA:  Chest pain during dialysis. EXAM: CHEST  2 VIEW COMPARISON:  06/03/2016. FINDINGS: Trachea is midline. Heart size stable. Lungs are clear. No pleural fluid. IMPRESSION: No acute findings. Electronically Signed   By: Leanna BattlesMelinda  Blietz M.D.   On: 03/28/2017 11:18   ____________________________________________    PROCEDURES  Procedure(s) performed: None  Procedures  Critical Care performed: None  ____________________________________________   INITIAL IMPRESSION / ASSESSMENT AND PLAN / ED COURSE  Pertinent labs & imaging results that were available during my care of the patient were reviewed by me and considered in my medical decision making (see chart for details).  Here with reproducible epigastric abdominal pain but a strong history of CAD, for this reason we will send cardiac enzymes, x2.  I did give her a GI cocktail and at this time she is sleeping in the room in no acute distress which I take to be a positive indicator.  Patient is in no acute distress.  Does not have a surgical abdomen.  I does have a history of reflux disease and is intermittently compliant with her omeprazole it appears.  We will continue to monitor her closely blood work is pending EKG is reassuring chest  x-ray is reassuring thus far.  ----------------------------------------- 2:32 PM on 03/28/2017 -----------------------------------------  Continues to complain of epigastric abdominal pain ultrasound is negative blood work is reassuring, this is very reproducible pain is nonsurgical however I will obtain a CT scan to further evaluate, patient had complete resolution of pain after GI cocktail but then pain came back.  Low suspicion for ACS PE dissection or any other intra-thoracic pathology given very reproducible minor discomfort in the epigastric region.  We will also obtain second cardiac enzymes.  Patient is crying in the room she states she is very upset, is unclear exactly why she is so upset, she states she has been having abdominal pain really since she woke up this morning this is not unusual for her given her history of reflux disease.   I do note that the patient has had extensive workup including CT scans in the past of her abdomen which were unremarkable as well as negative MRIs of her head and CT scans of her head, large number of negative radiographic studies have been performed however we will still give her the benefit of that and obtain imaging. ----------------------------------------- 3:34 PM on 03/28/2017 -----------------------------------------  Signed out to dr. Shaune PollackLord at the end of my shift   ____________________________________________   FINAL CLINICAL IMPRESSION(S) / ED DIAGNOSES  Final diagnoses:  None      This chart was dictated using voice recognition software.  Despite best efforts to proofread,  errors can occur which can change meaning.  Jeanmarie Plant, MD 03/28/17 1207    Jeanmarie Plant, MD 03/28/17 1433    Jeanmarie Plant, MD 03/28/17 1435    Jeanmarie Plant, MD 03/28/17 1447    Jeanmarie Plant, MD 03/28/17 [phone]

## 2017-03-28 NOTE — ED Notes (Signed)
Called reports to [facility] nurse. They do not have transport so ask for [place] to call EMS to take back to [facility]

## 2017-03-28 NOTE — Discharge Instructions (Signed)
You are evaluated primarily with Dr. Alphonzo Lemmings for chest pain and abdominal pain, and although no certain cause was found, your exam and evaluation are overall reassuring in the emergency department today.  Return to the emergency room immediately for any worsening or uncontrolled pain, chest pain, trouble breathing or coughing, fever, black or bloody stool, vomiting blood, rectal pain, or any other symptoms concerning to you.

## 2017-03-28 NOTE — ED Triage Notes (Signed)
Patient was at dialysis and during treatment started experiencing chest pain. Patient had 3L pulled from her when she started having chest pains.

## 2017-03-28 NOTE — ED Provider Notes (Addendum)
[facility]  I accepted care from Dr. Alphonzo LemmingsMcshane ____________________________________________    LABS (pertinent positives/negatives)  I, Governor Rooksebecca Sie Formisano, MD have personally reviewed the lab reports noted below.  Labs Reviewed  BASIC METABOLIC PANEL - Abnormal; Notable for the following components:      Result Value   Chloride 97 (*)    Glucose, Bld 154 (*)    BUN 27 (*)    Creatinine, Ser 3.52 (*)    Calcium 8.3 (*)    GFR calc non Af Amer 14 (*)    GFR calc Af Amer 16 (*)    All other components within normal limits  CBC - Abnormal; Notable for the following components:   RBC 3.70 (*)    Hemoglobin 11.0 (*)    HCT 33.3 (*)    All other components within normal limits  HEPATIC FUNCTION PANEL - Abnormal; Notable for the following components:   Total Protein 6.4 (*)    Albumin 3.2 (*)    ALT 11 (*)    Alkaline Phosphatase 147 (*)    Bilirubin, Direct <0.1 (*)    All other components within normal limits  TROPONIN I  LIPASE, BLOOD  TROPONIN I     ____________________________________________    RADIOLOGY All xrays were viewed by me. Imaging interpreted by radiologist.  I, Governor Rooksebecca Lilac Hoff MD have personally reviewed the imaging report noted below.  CT abdomen pelvis with contrast: IMPRESSION: Mild diffuse bowel wall thickening of the rectum raising the question of proctitis.  Patchy consolidation of the right lower lobe suspicious for pneumonia.  ____________________________________________   PROCEDURES  Procedure(s) performed: None  Critical Care performed: None  ____________________________________________   INITIAL IMPRESSION / ASSESSMENT AND PLAN / ED COURSE   Pertinent labs & imaging results that were available during my care of the patient were reviewed by me and considered in my medical decision making (see chart for details).  I went to reevaluate the patient at 4:00 after CT result and repeat troponin were back, the 2 pending  items at time of signout.  Dr. Iline OvenMcShane's impression with this was potentially epigastric related, nonspecific chest/abdominal discomfort.  Her repeat troponin is negative.  Her CT scan of the abdomen and pelvis does not give any findings that are consistent with where she is describing her pain which is right into the epigastrium.  We discussed the incidental findings that were read out by radiology as possibilities of consider right side pneumonia, patient states that she has not had any shortness of breath or coughing or fevers.  She has not had elevated white blood cell count here.  We discussed whether or not to place on antibiotic related to this purely imaging finding, and since it does not matter clinically, patient was agreeable to hold off and I think this is reasonable right now.  Clinically her symptoms however she is pointing to her epigastrium which seems more likely to be related to GERD or gastritis.  The other finding on the CT was thickening of the rectum, raising possible suspicion for proctitis.  Again clinically the does not line up with any clinical symptoms.  We discussed this finding, vascular follow-up with her primary care doctor regarding this and whether not she should have any other investigation in terms of GI or proctologist evaluation.  From the standpoint of the epigastric pain that brought her in, no certain etiology, but her exam and evaluation are overall reassuring in the emergency department.  When patient was sleeping on the  stretcher, O2 sat went down to the mid #80s.  Patient's O2 sat comes right up to 96% on room air when she is awake.  She states that she has been told that she thinks that she has sleep apnea, but she has done a sleep study or does not have a CPAP machine at home.  We discussed that she should follow-up with primary care doctor regarding consideration of getting a sleep study done as well.  CONSULTATIONS: None    Patient / Family /  Caregiver informed of clinical course, medical decision-making process, and agree with plan.   I discussed return precautions, follow-up instructions, and discharged instructions with patient and/or family.   Discharge instructions:  You are evaluated primarily with Dr. Alphonzo Lemmings for chest pain and abdominal pain, and although no certain cause was found, your exam and evaluation are overall reassuring in the emergency department today.  Return to the emergency room immediately for any worsening or uncontrolled pain, chest pain, trouble breathing or coughing, fever, black or bloody stool, vomiting blood, rectal pain, or any other symptoms concerning to you.     ____________________________________________   FINAL CLINICAL IMPRESSION(S) / ED DIAGNOSES  Final diagnoses:  Abdominal pain  Nonspecific chest pain        Governor Rooks, MD 03/28/17 1614    Governor Rooks, MD 03/28/17 1620

## 2017-03-28 NOTE — ED Notes (Signed)
EMS here to transport pt back to [facility]. NAD. Denies pain. VSS. Report called to Nurse at [facility].

## 2017-04-03 ENCOUNTER — Ambulatory Visit (INDEPENDENT_AMBULATORY_CARE_PROVIDER_SITE_OTHER): Payer: Medicare HMO | Admitting: Vascular Surgery

## 2017-04-03 ENCOUNTER — Encounter (INDEPENDENT_AMBULATORY_CARE_PROVIDER_SITE_OTHER): Payer: Self-pay | Admitting: Vascular Surgery

## 2017-04-03 ENCOUNTER — Ambulatory Visit (INDEPENDENT_AMBULATORY_CARE_PROVIDER_SITE_OTHER): Payer: Medicare HMO

## 2017-04-03 VITALS — BP 150/62 | HR 64 | Resp 16 | Wt 228.0 lb

## 2017-04-03 DIAGNOSIS — I10 Essential (primary) hypertension: Secondary | ICD-10-CM

## 2017-04-03 DIAGNOSIS — Z992 Dependence on renal dialysis: Secondary | ICD-10-CM

## 2017-04-03 DIAGNOSIS — I2510 Atherosclerotic heart disease of native coronary artery without angina pectoris: Secondary | ICD-10-CM

## 2017-04-03 DIAGNOSIS — E1121 Type 2 diabetes mellitus with diabetic nephropathy: Secondary | ICD-10-CM

## 2017-04-03 DIAGNOSIS — N186 End stage renal disease: Secondary | ICD-10-CM | POA: Diagnosis not present

## 2017-04-03 DIAGNOSIS — T829XXS Unspecified complication of cardiac and vascular prosthetic device, implant and graft, sequela: Secondary | ICD-10-CM | POA: Diagnosis not present

## 2017-04-04 ENCOUNTER — Encounter (INDEPENDENT_AMBULATORY_CARE_PROVIDER_SITE_OTHER): Payer: Self-pay | Admitting: Vascular Surgery

## 2017-04-04 NOTE — Progress Notes (Signed)
MRN : [id number]  Kimberly Montgomery is a 52 y.o. (14-Mar-1966) female who presents with chief complaint of  Chief Complaint  Patient presents with  . Follow-up    73mo HDA  .  History of Present Illness: The patient returns to the office for followup of their dialysis access. The function of the access has been stable. The patient denies increased bleeding time or increased recirculation. Patient denies difficulty with cannulation. The patient denies hand pain or other symptoms consistent with steal phenomena.  No significant arm swelling.  The patient denies redness or swelling at the access site. The patient denies fever or chills at home or while on dialysis.  The patient denies amaurosis fugax or recent TIA symptoms. There are no recent neurological changes noted. The patient denies claudication symptoms or rest pain symptoms. The patient denies history of DVT, PE or superficial thrombophlebitis. The patient denies recent episodes of angina or shortness of breath.      Current Meds  Medication Sig  . Amino Acids-Protein Hydrolys (FEEDING SUPPLEMENT, PRO-STAT SUGAR FREE 64,) LIQD Take 30 mLs by mouth 3 (three) times daily.   [name] amLODipine (NORVASC) 10 MG tablet Take 10 mg by mouth daily.   [name] aspirin 81 MG chewable tablet Chew 81 mg by mouth daily.  [name] atorvastatin (LIPITOR) 40 MG tablet Take 40 mg by mouth daily at 6 PM.  . benztropine (COGENTIN) 0.5 MG tablet Take 0.5 mg by mouth 2 (two) times daily.  . bisacodyl (DULCOLAX) 10 MG suppository Place 10 mg rectally as needed for moderate constipation.  . carboxymethylcellulose (REFRESH) 1 % ophthalmic solution Place 1 drop into both eyes 3 (three) times daily.  . clonazePAM (KLONOPIN) 0.5 MG tablet Take 0.5 mg by mouth at bedtime.  . clopidogrel (PLAVIX) 75 MG tablet Take 75 mg by mouth daily at 12 noon.   . fluticasone (FLONASE) 50 MCG/ACT nasal spray Place 1 spray into both nostrils daily.   [name] gabapentin (NEURONTIN) 300 MG capsule  Take 300 mg by mouth daily.   [name] HYDROcodone-acetaminophen (NORCO) 5-325 MG tablet Take 1-2 tablets by mouth every 6 (six) hours as needed for moderate pain or severe pain.  [name] insulin aspart (NOVOLOG) 100 UNIT/ML injection Inject 8 Units into the skin 3 (three) times daily with meals.   . insulin detemir (LEVEMIR) 100 UNIT/ML injection Inject 9 Units into the skin at bedtime.   . isosorbide mononitrate (IMDUR) 30 MG 24 hr tablet Take 90 mg by mouth daily at 12 noon.   . lactulose (CHRONULAC) 10 GM/15ML solution Take 10 g by mouth daily.   [name] levETIRAcetam (KEPPRA) 750 MG tablet Take 1,500 mg by mouth daily at 12 noon.  . lidocaine (LIDODERM) 5 % Place 1 patch onto the skin at bedtime.   . lidocaine (LMX) 4 % cream Apply 1 application topically daily as needed (pain). To right arm fistula  . Melatonin 3 MG TABS Take 3 mg by mouth at bedtime.  . metoprolol (LOPRESSOR) 50 MG tablet Take 1 tablet (50 mg total) by mouth 2 (two) times daily.  . nitroGLYCERIN (NITROSTAT) 0.4 MG SL tablet Place 0.4 mg under the tongue every 5 (five) minutes as needed for chest pain. Reported on 07/10/2015  . omeprazole (PRILOSEC) 20 MG capsule Take 20 mg by mouth 2 (two) times daily.   . ondansetron (ZOFRAN ODT) 4 MG disintegrating tablet Take 1 tablet (4 mg total) by mouth every 6 (six) hours as needed for nausea or vomiting. (Patient  taking differently: Take 4 mg by mouth every 8 (eight) hours as needed for nausea or vomiting. )  . OXcarbazepine (TRILEPTAL) 150 MG tablet Take 150 mg by mouth at bedtime.  . polyethylene glycol (MIRALAX / GLYCOLAX) packet Take 17 g by mouth daily. (Patient taking differently: Take 17 g by mouth daily at 12 noon. )  . senna (SENOKOT) 8.6 MG TABS tablet Take 2 tablets by mouth 2 (two) times daily.   . sevelamer carbonate (RENVELA) 800 MG tablet Take 800 mg by mouth See admin instructions. Take 1 tablet four times a day. (with each meal and with bedtime snack.)  . torsemide (DEMADEX) 100 MG  tablet Take 100 mg by mouth daily.     Past Medical History:  Diagnosis Date  . Anginal pain (HCC)   . Anxiety   . Bipolar disorder (HCC)   . CAD (coronary artery disease)   . CHF (congestive heart failure) (HCC)   . Chronic lower back pain   . Depression   . Endometriosis   . ESRD (end stage renal disease) on dialysis (HCC)    "[facility]; [address]; [place]; TTS" (01/19/2016)  . Gastroparesis   . GERD (gastroesophageal reflux disease)   . History of blood transfusion "several"   "my blood would get low; low RBC"  . History of hiatal hernia   . HLD (hyperlipidemia)   . Hypertension   . Migraine    "monthly" (01/19/2016)  . Myocardial infarction (HCC) 2017   "~ 3 wks ago" (01/19/2016)  . Renal disorder   . Renal insufficiency   . Seizures (HCC) 07/2015   "I've only had the 1; don't know what from" (01/19/2016)  . Type II diabetes mellitus (HCC)     Past Surgical History:  Procedure Laterality Date  . ABDOMINAL HYSTERECTOMY     "partial"  . AV FISTULA PLACEMENT Right 08/23/2016   Procedure: ARTERIOVENOUS (AV) FISTULA CREATION  ( BRACHIAL CEPHALIC );  Surgeon: Renford Dills, MD;  Location: [facility] ORS;  Service: Vascular;  Laterality: Right;  . BELOW KNEE LEG AMPUTATION Right 2010?  [name] CARDIAC CATHETERIZATION Right 07/10/2015   Procedure: Left Heart Cath and Coronary Angiography;  Surgeon: Laurier Nancy, MD;  Location: [facility] CV [facility];  Service: Cardiovascular;  Laterality: Right;  . CARDIAC CATHETERIZATION Right 09/14/2015   Procedure: Left Heart Cath and Coronary Angiography;  Surgeon: Laurier Nancy, MD;  Location: [facility];  Service: Cardiovascular;  Laterality: Right;  . CARDIAC CATHETERIZATION N/A 09/14/2015   Procedure: Coronary Stent Intervention;  Surgeon: Alwyn Pea, MD;  Location: [facility];  Service: Cardiovascular;  Laterality: N/A;  . CARDIAC CATHETERIZATION Right 11/20/2015   Procedure: Left Heart Cath and Coronary  Angiography;  Surgeon: Laurier Nancy, MD;  Location: [facility];  Service: Cardiovascular;  Laterality: Right;  . CATARACT EXTRACTION, BILATERAL    . CORONARY ANGIOPLASTY WITH STENT PLACEMENT  <2017   @ [facility]/notes 07/03/2013  . CORONARY ANGIOPLASTY WITH STENT PLACEMENT    . DIALYSIS/PERMA CATHETER REMOVAL N/A 11/12/2016   Procedure: DIALYSIS/PERMA CATHETER REMOVAL;  Surgeon: Renford Dills, MD;  Location: [facility];  Service: Cardiovascular;  Laterality: N/A;  . HYSTEROTOMY    . PERIPHERAL VASCULAR CATHETERIZATION N/A 08/30/2015   Procedure: Dialysis/Perma Catheter Insertion;  Surgeon: Annice Needy, MD;  Location: [facility];  Service: Cardiovascular;  Laterality: N/A;  . PERIPHERAL VASCULAR CATHETERIZATION N/A 01/01/2016   Procedure: Dialysis/Perma Catheter Insertion;  Surgeon: Barbara Cower  Driscilla Grammes, MD;  Location: [facility];  Service: Cardiovascular;  Laterality: N/A;  . PERITONEAL CATHETER INSERTION  11/03/2013   [name] 11/03/2013  . PERITONEAL CATHETER REMOVAL  01/01/2016   "took the one from May out; put new PD cath in" (01/19/2016)  . PERITONEAL CATHETER REMOVAL  11/03/2013; 02/11/2014   [name] 11/03/2013; Removal of tunneled catheter/notes 02/11/2014  . SALPINGOOPHORECTOMY Right    [name] 07/03/2013  . TEE WITHOUT CARDIOVERSION N/A 06/07/2016   Procedure: TRANSESOPHAGEAL ECHOCARDIOGRAM (TEE);  Surgeon: Chrystie Nose, MD;  Location: [facility];  Service: Cardiovascular;  Laterality: N/A;  . TUBAL LIGATION      Social History Social History   Tobacco Use  . Smoking status: Former Smoker    Packs/day: 0.75    Types: Cigarettes    Last attempt to quit: 07/01/2015    Years since quitting: 1.7  . Smokeless tobacco: Never Used  Substance Use Topics  . Alcohol use: No    Alcohol/week: 0.0 oz  . Drug use: No    Family History Family History  Problem Relation Age of Onset  . CAD Unknown   . Diabetes Unknown   . Bipolar disorder Unknown   . Cervical  cancer Mother   . Other Father        heart bypass  . Breast cancer Neg Hx     Allergies  Allergen Reactions  . Cephalosporins Anaphylaxis    Patient has tolerated meropenem.   [name] Penicillins Anaphylaxis and Other (See Comments)    Has patient had a PCN reaction causing immediate rash, facial/tongue/throat swelling, SOB or lightheadedness with hypotension: Yes Has patient had a PCN reaction causing severe rash involving mucus membranes or skin necrosis: No Has patient had a PCN reaction that required hospitalization No Has patient had a PCN reaction occurring within the last 10 years: No If all of the above answers are "NO", then may proceed with Cephalosporin use.  . Reglan [Metoclopramide] Other (See Comments)    Severe EPS (lip, head, body tremors) March 2018  . Risperdal [Risperidone] Other (See Comments)    Severe EPS (lip, head, body tremors) March 2018  . Lamictal [Lamotrigine] Other (See Comments)    Reaction:  Hallucinations  . Phenergan [Promethazine Hcl] Nausea And Vomiting    Sensitivity to medicine  . Pravastatin Other (See Comments)    Reaction:  Muscle pain   . Sulfa Antibiotics Other (See Comments)    Reaction:  Unknown      REVIEW OF SYSTEMS (Negative unless checked)  Constitutional: [] Weight loss  [] Fever  [] Chills Cardiac: [] Chest pain   [] Chest pressure   [] Palpitations   [] Shortness of breath when laying flat   [] Shortness of breath with exertion. Vascular:  [] Pain in legs with walking   [] Pain in legs at rest  [] History of DVT   [] Phlebitis   [] Swelling in legs   [] Varicose veins   [] Non-healing ulcers Pulmonary:   [] Uses home oxygen   [] Productive cough   [] Hemoptysis   [] Wheeze  [] COPD   [] Asthma Neurologic:  [] Dizziness   [] Seizures   [] History of stroke   [] History of TIA  [] Aphasia   [] Vissual changes   [] Weakness or numbness in arm   [] Weakness or numbness in leg Musculoskeletal:   [] Joint swelling   [] Joint pain   [] Low back pain Hematologic:  [] Easy  bruising  [] Easy bleeding   [] Hypercoagulable state   [] Anemic Gastrointestinal:  [] Diarrhea   [] Vomiting  [] Gastroesophageal reflux/heartburn   [] Difficulty swallowing. Genitourinary:  [  x]Chronic kidney disease   [] Difficult urination  [] Frequent urination   [] Blood in urine Skin:  [] Rashes   [] Ulcers  Psychological:  [] History of anxiety   []  History of major depression.  Physical Examination  Vitals:   04/03/17 1021  BP: (!) 150/62  Pulse: 64  Resp: 16  Weight: 228 lb (103.4 kg)   Body mass index is 33.67 kg/m. Gen: WD/WN, NAD; patient debilitated and in a wheelchair Head: [place]/AT, No temporalis wasting.  Ear/Nose/Throat: Hearing grossly intact, nares w/o erythema or drainage Eyes: PER, EOMI, sclera nonicteric.  Neck: Supple, no large masses.   Pulmonary:  Good air movement, no audible wheezing bilaterally, no use of accessory muscles.  Cardiac: RRR, no JVD Vascular: Right arm AV access good thrill good bruit skin appears healthy and intact overlying the access Vessel Right Left  Radial Palpable Palpable  Ulnar Palpable Palpable  Brachial Palpable Palpable  Gastrointestinal: Non-distended. No guarding/no peritoneal signs.  Musculoskeletal: M/S 5/5 throughout.  Right BKA.  Neurologic: CN 2-12 intact. Symmetrical.  Speech is fluent. Motor exam as listed above. Psychiatric: Judgment intact, Mood & affect appropriate for pt's clinical situation. Dermatologic: No rashes or ulcers noted.  No changes consistent with cellulitis. Lymph : No lichenification or skin changes of chronic lymphedema.  CBC Lab Results  Component Value Date   WBC 6.2 03/28/2017   HGB 11.0 (L) 03/28/2017   HCT 33.3 (L) 03/28/2017   MCV 89.9 03/28/2017   PLT 260 03/28/2017    BMET    Component Value Date/Time   NA 135 03/28/2017 1052   NA 136 (A) 03/27/2016   NA 136 07/17/2014 0417   K 4.0 03/28/2017 1052   K 3.5 07/17/2014 0417   CL 97 (L) 03/28/2017 1052   CL 103 07/17/2014 0417   CO2 31  03/28/2017 1052   CO2 26 07/17/2014 0417   GLUCOSE 154 (H) 03/28/2017 1052   GLUCOSE 221 (H) 07/17/2014 0417   BUN 27 (H) 03/28/2017 1052   BUN 46 (A) 03/27/2016   BUN 32 (H) 07/17/2014 0417   CREATININE 3.52 (H) 03/28/2017 1052   CREATININE 3.45 (H) 07/17/2014 0417   CALCIUM 8.3 (L) 03/28/2017 1052   CALCIUM 8.4 (L) 07/17/2014 0417   GFRNONAA 14 (L) 03/28/2017 1052   GFRNONAA 15 (L) 07/17/2014 0417   GFRAA 16 (L) 03/28/2017 1052   GFRAA 17 (L) 07/17/2014 0417   Estimated Creatinine Clearance: 23.7 mL/min (A) (by C-G formula based on SCr of 3.52 mg/dL (H)).  COAG Lab Results  Component Value Date   INR 0.97 08/19/2016   INR 0.99 05/31/2016   INR 1.00 04/10/2016    Radiology Ct Abdomen Pelvis Wo Contrast  Result Date: 03/28/2017 CLINICAL DATA:  Mid abdomen pain and distention since this morning. EXAM: CT ABDOMEN AND PELVIS WITHOUT CONTRAST TECHNIQUE: Multidetector CT imaging of the abdomen and pelvis was performed following the standard protocol without IV contrast. COMPARISON:  April 02, 2016 FINDINGS: Lower chest: There is patchy consolidation of the posterior right lower lobe. Minor atelectasis of the posterior left lung base is noted. The heart size is mildly enlarged. Hepatobiliary: No focal liver abnormality is seen. No gallstones, gallbladder wall thickening, or biliary dilatation. Pancreas: Unremarkable. No pancreatic ductal dilatation or surrounding inflammatory changes. Spleen: Normal in size without focal abnormality. Adrenals/Urinary Tract: The adrenal glands are normal. Bilateral renal vascular calcifications are identified. There is no hydronephrosis bilaterally. No focal renal lesion is noted. The bladder is normal. Stomach/Bowel: There is a minimal hiatal hernia.  The stomach is otherwise normal. There is no small bowel obstruction. The appendix is normal. There is mild diffuse wall thickening of the rectum. Vascular/Lymphatic: Aortic atherosclerosis. No enlarged  abdominal or pelvic lymph nodes. Reproductive: Uterus and bilateral adnexa are unremarkable. Other: No abdominal wall hernia or abnormality. No abdominopelvic ascites. There is third spacing of fluid within the subcutaneous fat of the abdomen and pelvis. Musculoskeletal: Degenerative joint changes of the spine are noted. IMPRESSION: Mild diffuse bowel wall thickening of the rectum raising the question of proctitis. Patchy consolidation of the right lower lobe suspicious for pneumonia. Electronically Signed   By: Sherian Rein M.D.   On: 03/28/2017 16:00   Dg Chest 2 View  Result Date: 03/28/2017 CLINICAL DATA:  Chest pain during dialysis. EXAM: CHEST  2 VIEW COMPARISON:  06/03/2016. FINDINGS: Trachea is midline. Heart size stable. Lungs are clear. No pleural fluid. IMPRESSION: No acute findings. Electronically Signed   By: Leanna Battles M.D.   On: 03/28/2017 11:18   US Abdomen Limited Ruq  Result Date: 03/28/2017 CLINICAL DATA:  Three IR history of abdominal pain. EXAM: ULTRASOUND ABDOMEN LIMITED RIGHT UPPER QUADRANT COMPARISON:  CT scan 04/02/2016 FINDINGS: Gallbladder: No gallstones or gallbladder wall thickening. No pericholecystic fluid. The sonographer reports no sonographic Murphy's sign. Common bile duct: Diameter: 3 mm Liver: No focal lesion identified. Within normal limits in parenchymal echogenicity. Portal vein is patent on color Doppler imaging with normal direction of blood flow towards the liver. IMPRESSION: Unremarkable right upper quadrant ultrasound exam. Electronically Signed   By: Kennith Center M.D.   On: 03/28/2017 13:57    Assessment/Plan 1. Complication from renal dialysis device, sequela Recommend:  The patient is doing well and currently has adequate dialysis access. The patient's dialysis center is not reporting any access issues. Flow pattern is stable when compared to the prior ultrasound.  The patient should have a duplex ultrasound of the dialysis access in 6  months.  The patient will follow-up with me in the office after each ultrasound    - VAS US DUPLEX DIALYSIS ACCESS (AVF, AVG); Future  2. ESRD on dialysis [facility]) Continue dialysis without interruption  3. Type 2 diabetes mellitus with diabetic nephropathy, unspecified whether long term insulin use (HCC) Continue hypoglycemic medications as already ordered, these medications have been reviewed and there are no changes at this time.  Hgb A1C to be monitored as already arranged by primary service   4. Essential hypertension Continue antihypertensive medications as already ordered, these medications have been reviewed and there are no changes at this time.   5. Coronary artery disease involving native coronary artery of native heart without angina pectoris Continue cardiac and antihypertensive medications as already ordered and reviewed, no changes at this time.  Continue statin as ordered and reviewed, no changes at this time  Nitrates PRN for chest pain     Levora Dredge, MD  04/04/2017 8:56 AM

## 2017-05-14 ENCOUNTER — Other Ambulatory Visit
Admission: RE | Admit: 2017-05-14 | Discharge: 2017-05-14 | Disposition: A | Discharge status: Home / Self Care | Payer: Medicare HMO | Source: Ambulatory Visit | Attending: Family Medicine | Admitting: Family Medicine

## 2017-05-14 DIAGNOSIS — N390 Urinary tract infection, site not specified: Secondary | ICD-10-CM | POA: Insufficient documentation

## 2017-05-14 LAB — URINALYSIS, COMPLETE (UACMP) WITH MICROSCOPIC
BILIRUBIN URINE: NEGATIVE
Glucose, UA: NEGATIVE mg/dL
KETONES UR: NEGATIVE mg/dL
NITRITE: NEGATIVE
PH: 8 (ref 5.0–8.0)
Protein, ur: 100 mg/dL — AB
Specific Gravity, Urine: 1.012 (ref 1.005–1.030)

## 2017-05-15 ENCOUNTER — Inpatient Hospital Stay
Admission: EM | Admit: 2017-05-15 | Discharge: 2017-05-21 | DRG: 193 | Disposition: A | Discharge status: Skilled Nursing Facility | Payer: Medicare HMO | Attending: Internal Medicine | Admitting: Internal Medicine

## 2017-05-15 ENCOUNTER — Inpatient Hospital Stay: Payer: Medicare HMO

## 2017-05-15 ENCOUNTER — Other Ambulatory Visit: Payer: Self-pay

## 2017-05-15 ENCOUNTER — Emergency Department: Payer: Medicare HMO

## 2017-05-15 DIAGNOSIS — N390 Urinary tract infection, site not specified: Secondary | ICD-10-CM | POA: Diagnosis present

## 2017-05-15 DIAGNOSIS — N2581 Secondary hyperparathyroidism of renal origin: Secondary | ICD-10-CM | POA: Diagnosis present

## 2017-05-15 DIAGNOSIS — Z79899 Other long term (current) drug therapy: Secondary | ICD-10-CM | POA: Diagnosis not present

## 2017-05-15 DIAGNOSIS — J111 Influenza due to unidentified influenza virus with other respiratory manifestations: Secondary | ICD-10-CM

## 2017-05-15 DIAGNOSIS — Z882 Allergy status to sulfonamides status: Secondary | ICD-10-CM

## 2017-05-15 DIAGNOSIS — Z888 Allergy status to other drugs, medicaments and biological substances status: Secondary | ICD-10-CM

## 2017-05-15 DIAGNOSIS — Y95 Nosocomial condition: Secondary | ICD-10-CM | POA: Diagnosis present

## 2017-05-15 DIAGNOSIS — J189 Pneumonia, unspecified organism: Secondary | ICD-10-CM | POA: Diagnosis present

## 2017-05-15 DIAGNOSIS — R4182 Altered mental status, unspecified: Secondary | ICD-10-CM

## 2017-05-15 DIAGNOSIS — D631 Anemia in chronic kidney disease: Secondary | ICD-10-CM | POA: Diagnosis present

## 2017-05-15 DIAGNOSIS — Z880 Allergy status to penicillin: Secondary | ICD-10-CM

## 2017-05-15 DIAGNOSIS — Z955 Presence of coronary angioplasty implant and graft: Secondary | ICD-10-CM | POA: Diagnosis not present

## 2017-05-15 DIAGNOSIS — Z7401 Bed confinement status: Secondary | ICD-10-CM

## 2017-05-15 DIAGNOSIS — Z87891 Personal history of nicotine dependence: Secondary | ICD-10-CM

## 2017-05-15 DIAGNOSIS — F319 Bipolar disorder, unspecified: Secondary | ICD-10-CM | POA: Diagnosis present

## 2017-05-15 DIAGNOSIS — K219 Gastro-esophageal reflux disease without esophagitis: Secondary | ICD-10-CM | POA: Diagnosis present

## 2017-05-15 DIAGNOSIS — N186 End stage renal disease: Secondary | ICD-10-CM | POA: Diagnosis present

## 2017-05-15 DIAGNOSIS — I252 Old myocardial infarction: Secondary | ICD-10-CM | POA: Diagnosis not present

## 2017-05-15 DIAGNOSIS — Z89511 Acquired absence of right leg below knee: Secondary | ICD-10-CM

## 2017-05-15 DIAGNOSIS — E875 Hyperkalemia: Secondary | ICD-10-CM | POA: Diagnosis present

## 2017-05-15 DIAGNOSIS — Z794 Long term (current) use of insulin: Secondary | ICD-10-CM

## 2017-05-15 DIAGNOSIS — J1100 Influenza due to unidentified influenza virus with unspecified type of pneumonia: Principal | ICD-10-CM | POA: Diagnosis present

## 2017-05-15 DIAGNOSIS — Z7982 Long term (current) use of aspirin: Secondary | ICD-10-CM

## 2017-05-15 DIAGNOSIS — Z7951 Long term (current) use of inhaled steroids: Secondary | ICD-10-CM

## 2017-05-15 DIAGNOSIS — I132 Hypertensive heart and chronic kidney disease with heart failure and with stage 5 chronic kidney disease, or end stage renal disease: Secondary | ICD-10-CM | POA: Diagnosis present

## 2017-05-15 DIAGNOSIS — I509 Heart failure, unspecified: Secondary | ICD-10-CM | POA: Diagnosis present

## 2017-05-15 DIAGNOSIS — I2510 Atherosclerotic heart disease of native coronary artery without angina pectoris: Secondary | ICD-10-CM | POA: Diagnosis present

## 2017-05-15 DIAGNOSIS — G253 Myoclonus: Secondary | ICD-10-CM | POA: Diagnosis not present

## 2017-05-15 DIAGNOSIS — E785 Hyperlipidemia, unspecified: Secondary | ICD-10-CM | POA: Diagnosis present

## 2017-05-15 DIAGNOSIS — Z992 Dependence on renal dialysis: Secondary | ICD-10-CM | POA: Diagnosis not present

## 2017-05-15 LAB — COMPREHENSIVE METABOLIC PANEL
ALBUMIN: 3.8 g/dL (ref 3.5–5.0)
ALK PHOS: 157 U/L — AB (ref 38–126)
ALT: 13 U/L — AB (ref 14–54)
AST: 34 U/L (ref 15–41)
Anion gap: 15 (ref 5–15)
BILIRUBIN TOTAL: 0.8 mg/dL (ref 0.3–1.2)
BUN: 65 mg/dL — ABNORMAL HIGH (ref 6–20)
CO2: 25 mmol/L (ref 22–32)
Calcium: 8.8 mg/dL — ABNORMAL LOW (ref 8.9–10.3)
Chloride: 93 mmol/L — ABNORMAL LOW (ref 101–111)
Creatinine, Ser: 7.35 mg/dL — ABNORMAL HIGH (ref 0.44–1.00)
GFR calc Af Amer: 7 mL/min — ABNORMAL LOW (ref >60)
GFR calc non Af Amer: 6 mL/min — ABNORMAL LOW (ref >60)
GLUCOSE: 165 mg/dL — AB (ref 65–99)
POTASSIUM: 7.1 mmol/L — AB (ref 3.5–5.1)
Sodium: 133 mmol/L — ABNORMAL LOW (ref 135–145)
TOTAL PROTEIN: 7.4 g/dL (ref 6.5–8.1)

## 2017-05-15 LAB — INFLUENZA PANEL BY PCR (TYPE A & B)
INFLAPCR: POSITIVE — AB
Influenza B By PCR: NEGATIVE

## 2017-05-15 LAB — URINALYSIS, COMPLETE (UACMP) WITH MICROSCOPIC
BACTERIA UA: NONE SEEN
Bilirubin Urine: NEGATIVE
Glucose, UA: 50 mg/dL — AB
Ketones, ur: NEGATIVE mg/dL
Leukocytes, UA: NEGATIVE
Nitrite: NEGATIVE
PROTEIN: 100 mg/dL — AB
Specific Gravity, Urine: 1.017 (ref 1.005–1.030)
pH: 6 (ref 5.0–8.0)

## 2017-05-15 LAB — BASIC METABOLIC PANEL
Anion gap: 14 (ref 5–15)
BUN: 67 mg/dL — AB (ref 6–20)
CALCIUM: 8.9 mg/dL (ref 8.9–10.3)
CHLORIDE: 94 mmol/L — AB (ref 101–111)
CO2: 24 mmol/L (ref 22–32)
CREATININE: 7.64 mg/dL — AB (ref 0.44–1.00)
GFR, EST AFRICAN AMERICAN: 6 mL/min — AB (ref >60)
GFR, EST NON AFRICAN AMERICAN: 5 mL/min — AB (ref >60)
Glucose, Bld: 135 mg/dL — ABNORMAL HIGH (ref 65–99)
Potassium: 6.7 mmol/L (ref 3.5–5.1)
SODIUM: 132 mmol/L — AB (ref 135–145)

## 2017-05-15 LAB — CBC WITH DIFFERENTIAL/PLATELET
BASOS ABS: 0 10*3/uL (ref 0–0.1)
Basophils Relative: 0 %
Eosinophils Absolute: 0 10*3/uL (ref 0–0.7)
Eosinophils Relative: 0 %
HCT: 32.1 % — ABNORMAL LOW (ref 35.0–47.0)
Hemoglobin: 10.5 g/dL — ABNORMAL LOW (ref 12.0–16.0)
LYMPHS PCT: 3 %
Lymphs Abs: 0.3 10*3/uL — ABNORMAL LOW (ref 1.0–3.6)
MCH: 30.2 pg (ref 26.0–34.0)
MCHC: 32.8 g/dL (ref 32.0–36.0)
MCV: 92 fL (ref 80.0–100.0)
Monocytes Absolute: 0.3 10*3/uL (ref 0.2–0.9)
Monocytes Relative: 3 %
Neutro Abs: 8.9 10*3/uL — ABNORMAL HIGH (ref 1.4–6.5)
Neutrophils Relative %: 94 %
Platelets: 228 10*3/uL (ref 150–440)
RBC: 3.49 MIL/uL — ABNORMAL LOW (ref 3.80–5.20)
RDW: 14.4 % (ref 11.5–14.5)
WBC: 9.6 10*3/uL (ref 3.6–11.0)

## 2017-05-15 LAB — MRSA PCR SCREENING: MRSA by PCR: POSITIVE — AB

## 2017-05-15 LAB — LACTIC ACID, PLASMA
LACTIC ACID, VENOUS: 1.4 mmol/L (ref 0.5–1.9)
Lactic Acid, Venous: 1.4 mmol/L (ref 0.5–1.9)

## 2017-05-15 LAB — CBC
HCT: 30.2 % — ABNORMAL LOW (ref 35.0–47.0)
HEMOGLOBIN: 10 g/dL — AB (ref 12.0–16.0)
MCH: 30.4 pg (ref 26.0–34.0)
MCHC: 33.2 g/dL (ref 32.0–36.0)
MCV: 91.5 fL (ref 80.0–100.0)
PLATELETS: 200 10*3/uL (ref 150–440)
RBC: 3.3 MIL/uL — ABNORMAL LOW (ref 3.80–5.20)
RDW: 14.3 % (ref 11.5–14.5)
WBC: 6.9 10*3/uL (ref 3.6–11.0)

## 2017-05-15 LAB — HEMOGLOBIN A1C
HEMOGLOBIN A1C: 5.7 % — AB (ref 4.8–5.6)
MEAN PLASMA GLUCOSE: 116.89 mg/dL

## 2017-05-15 LAB — PHOSPHORUS: Phosphorus: 5.1 mg/dL — ABNORMAL HIGH (ref 2.5–4.6)

## 2017-05-15 LAB — GLUCOSE, CAPILLARY: GLUCOSE-CAPILLARY: 122 mg/dL — AB (ref 65–99)

## 2017-05-15 MED ORDER — HEPARIN SODIUM (PORCINE) 5000 UNIT/ML IJ SOLN
5000.0000 [IU] | Freq: Three times a day (TID) | INTRAMUSCULAR | Status: DC
  Administered 2017-05-15 – 2017-05-21 (×16): 5000 [IU] via SUBCUTANEOUS
  Filled 2017-05-15 (×17): qty 1

## 2017-05-15 MED ORDER — RENA-VITE PO TABS
1.0000 | ORAL_TABLET | Freq: Every day | ORAL | Status: DC
  Administered 2017-05-15 – 2017-05-20 (×6): 1 via ORAL
  Filled 2017-05-15 (×6): qty 1

## 2017-05-15 MED ORDER — ATORVASTATIN CALCIUM 20 MG PO TABS
40.0000 mg | ORAL_TABLET | Freq: Every day | ORAL | Status: DC
  Administered 2017-05-15 – 2017-05-19 (×5): 40 mg via ORAL
  Filled 2017-05-15 (×5): qty 2

## 2017-05-15 MED ORDER — OSELTAMIVIR PHOSPHATE 75 MG PO CAPS: 75.0000 mg | ORAL_CAPSULE | Freq: Once | ORAL | Status: DC

## 2017-05-15 MED ORDER — ORAL CARE MOUTH RINSE
15.0000 mL | Freq: Two times a day (BID) | OROMUCOSAL | Status: DC
  Administered 2017-05-15 – 2017-05-20 (×4): 15 mL via OROMUCOSAL

## 2017-05-15 MED ORDER — SODIUM CHLORIDE 0.9 % IV SOLN: 100.0000 mL | INTRAVENOUS | Status: DC | PRN

## 2017-05-15 MED ORDER — VANCOMYCIN HCL IN DEXTROSE 1-5 GM/200ML-% IV SOLN
1000.0000 mg | INTRAVENOUS | Status: DC
  Administered 2017-05-15 – 2017-05-17 (×2): 1000 mg via INTRAVENOUS
  Filled 2017-05-15 (×2): qty 200

## 2017-05-15 MED ORDER — CLOPIDOGREL BISULFATE 75 MG PO TABS
75.0000 mg | ORAL_TABLET | Freq: Every day | ORAL | Status: DC
  Administered 2017-05-15 – 2017-05-20 (×6): 75 mg via ORAL
  Filled 2017-05-15 (×6): qty 1

## 2017-05-15 MED ORDER — VANCOMYCIN HCL IN DEXTROSE 1-5 GM/200ML-% IV SOLN
1000.0000 mg | Freq: Once | INTRAVENOUS | Status: AC
  Administered 2017-05-15: 1000 mg via INTRAVENOUS
  Filled 2017-05-15: qty 200

## 2017-05-15 MED ORDER — NITROGLYCERIN 0.4 MG SL SUBL
0.4000 mg | SUBLINGUAL_TABLET | SUBLINGUAL | Status: DC | PRN
  Administered 2017-05-17: 0.4 mg via SUBLINGUAL
  Filled 2017-05-15: qty 1

## 2017-05-15 MED ORDER — SENNA 8.6 MG PO TABS
2.0000 | ORAL_TABLET | Freq: Two times a day (BID) | ORAL | Status: DC
  Administered 2017-05-15 – 2017-05-21 (×12): 17.2 mg via ORAL
  Filled 2017-05-15 (×12): qty 2

## 2017-05-15 MED ORDER — ALBUTEROL SULFATE (2.5 MG/3ML) 0.083% IN NEBU
2.5000 mg | INHALATION_SOLUTION | Freq: Once | RESPIRATORY_TRACT | Status: AC
  Administered 2017-05-15: 2.5 mg via RESPIRATORY_TRACT
  Filled 2017-05-15: qty 3

## 2017-05-15 MED ORDER — IOPAMIDOL (ISOVUE-300) INJECTION 61%
15.0000 mL | INTRAVENOUS | Status: AC
  Administered 2017-05-15: 15 mL via ORAL

## 2017-05-15 MED ORDER — POLYVINYL ALCOHOL 1.4 % OP SOLN
1.0000 [drp] | Freq: Three times a day (TID) | OPHTHALMIC | Status: DC
  Administered 2017-05-15 – 2017-05-21 (×17): 1 [drp] via OPHTHALMIC
  Filled 2017-05-15 (×3): qty 15

## 2017-05-15 MED ORDER — SODIUM CHLORIDE 0.9% FLUSH
3.0000 mL | Freq: Two times a day (BID) | INTRAVENOUS | Status: DC
  Administered 2017-05-15 – 2017-05-21 (×12): 3 mL via INTRAVENOUS

## 2017-05-15 MED ORDER — SODIUM CHLORIDE 0.9 % IV SOLN: 250.0000 mL | INTRAVENOUS | Status: DC | PRN

## 2017-05-15 MED ORDER — CLONAZEPAM 0.5 MG PO TABS
0.5000 mg | ORAL_TABLET | Freq: Every day | ORAL | Status: DC
  Administered 2017-05-15 – 2017-05-16 (×2): 0.5 mg via ORAL
  Filled 2017-05-15 (×2): qty 1

## 2017-05-15 MED ORDER — MUPIROCIN 2 % EX OINT
1.0000 "application " | TOPICAL_OINTMENT | Freq: Two times a day (BID) | CUTANEOUS | Status: AC
  Administered 2017-05-15 – 2017-05-20 (×10): 1 via NASAL
  Filled 2017-05-15 (×2): qty 22

## 2017-05-15 MED ORDER — INSULIN ASPART 100 UNIT/ML ~~LOC~~ SOLN
6.0000 [IU] | Freq: Once | SUBCUTANEOUS | Status: AC
  Administered 2017-05-15: 6 [IU] via INTRAVENOUS
  Filled 2017-05-15: qty 1

## 2017-05-15 MED ORDER — METOPROLOL TARTRATE 50 MG PO TABS
50.0000 mg | ORAL_TABLET | Freq: Two times a day (BID) | ORAL | Status: DC
  Administered 2017-05-15 – 2017-05-20 (×11): 50 mg via ORAL
  Filled 2017-05-15 (×12): qty 1

## 2017-05-15 MED ORDER — LEVOFLOXACIN IN D5W 750 MG/150ML IV SOLN
750.0000 mg | Freq: Once | INTRAVENOUS | Status: AC
  Administered 2017-05-15: 750 mg via INTRAVENOUS
  Filled 2017-05-15: qty 150

## 2017-05-15 MED ORDER — CHLORHEXIDINE GLUCONATE 0.12 % MT SOLN
15.0000 mL | Freq: Two times a day (BID) | OROMUCOSAL | Status: DC
  Administered 2017-05-15 – 2017-05-21 (×11): 15 mL via OROMUCOSAL
  Filled 2017-05-15 (×12): qty 15

## 2017-05-15 MED ORDER — ONDANSETRON HCL 4 MG/2ML IJ SOLN
4.0000 mg | Freq: Four times a day (QID) | INTRAMUSCULAR | Status: DC | PRN
  Administered 2017-05-15 – 2017-05-16 (×3): 4 mg via INTRAVENOUS
  Filled 2017-05-15 (×3): qty 2

## 2017-05-15 MED ORDER — AZTREONAM 1 G IJ SOLR
1.0000 g | Freq: Two times a day (BID) | INTRAMUSCULAR | Status: DC
  Administered 2017-05-15 – 2017-05-16 (×4): 1 g via INTRAVENOUS
  Filled 2017-05-15 (×6): qty 1

## 2017-05-15 MED ORDER — ACETAMINOPHEN 650 MG RE SUPP: 650.0000 mg | Freq: Four times a day (QID) | RECTAL | Status: DC | PRN

## 2017-05-15 MED ORDER — AZTREONAM 2 G IJ SOLR
2.0000 g | Freq: Once | INTRAMUSCULAR | Status: AC
  Administered 2017-05-15: 2 g via INTRAVENOUS
  Filled 2017-05-15: qty 2

## 2017-05-15 MED ORDER — PANTOPRAZOLE SODIUM 40 MG PO TBEC
40.0000 mg | DELAYED_RELEASE_TABLET | Freq: Every day | ORAL | Status: DC
Start: 2017-05-15 — End: 2017-05-21
  Administered 2017-05-15 – 2017-05-21 (×7): 40 mg via ORAL
  Filled 2017-05-15 (×6): qty 1

## 2017-05-15 MED ORDER — ALTEPLASE 2 MG IJ SOLR: 2.0000 mg | Freq: Once | INTRAMUSCULAR | Status: DC | PRN

## 2017-05-15 MED ORDER — LEVOFLOXACIN IN D5W 500 MG/100ML IV SOLN
500.0000 mg | INTRAVENOUS | Status: DC
  Administered 2017-05-17 – 2017-05-19 (×2): 500 mg via INTRAVENOUS
  Filled 2017-05-15 (×2): qty 100

## 2017-05-15 MED ORDER — OSELTAMIVIR PHOSPHATE 30 MG PO CAPS
30.0000 mg | ORAL_CAPSULE | Freq: Once | ORAL | Status: DC
  Filled 2017-05-15: qty 1

## 2017-05-15 MED ORDER — OXCARBAZEPINE 150 MG PO TABS
150.0000 mg | ORAL_TABLET | Freq: Every day | ORAL | Status: DC
  Administered 2017-05-15 – 2017-05-20 (×6): 150 mg via ORAL
  Filled 2017-05-15 (×6): qty 1

## 2017-05-15 MED ORDER — LIDOCAINE HCL (PF) 1 % IJ SOLN
5.0000 mL | INTRAMUSCULAR | Status: DC | PRN
  Filled 2017-05-15: qty 5

## 2017-05-15 MED ORDER — SEVELAMER CARBONATE 800 MG PO TABS: 800.0000 mg | ORAL_TABLET | ORAL | Status: DC

## 2017-05-15 MED ORDER — CALCIUM GLUCONATE 10 % IV SOLN
1.0000 g | Freq: Once | INTRAVENOUS | Status: AC
  Administered 2017-05-15: 1 g via INTRAVENOUS

## 2017-05-15 MED ORDER — NEPRO/CARBSTEADY PO LIQD
237.0000 mL | Freq: Two times a day (BID) | ORAL | Status: DC
  Administered 2017-05-16 – 2017-05-21 (×7): 237 mL via ORAL

## 2017-05-15 MED ORDER — SODIUM CHLORIDE 0.9% FLUSH: 3.0000 mL | INTRAVENOUS | Status: DC | PRN

## 2017-05-15 MED ORDER — MELATONIN 3 MG PO TABS: 3.0000 mg | ORAL_TABLET | Freq: Every day | ORAL | Status: DC

## 2017-05-15 MED ORDER — DEXTROSE 50 % IV SOLN
50.0000 mL | Freq: Once | INTRAVENOUS | Status: AC
  Administered 2017-05-15: 50 mL via INTRAVENOUS
  Filled 2017-05-15: qty 50

## 2017-05-15 MED ORDER — IOPAMIDOL (ISOVUE-300) INJECTION 61%
100.0000 mL | Freq: Once | INTRAVENOUS | Status: AC | PRN
  Administered 2017-05-15: 100 mL via INTRAVENOUS

## 2017-05-15 MED ORDER — PRO-STAT SUGAR FREE PO LIQD
30.0000 mL | Freq: Three times a day (TID) | ORAL | Status: DC
  Administered 2017-05-15 – 2017-05-21 (×10): 30 mL via ORAL

## 2017-05-15 MED ORDER — TORSEMIDE 20 MG PO TABS
100.0000 mg | ORAL_TABLET | Freq: Every day | ORAL | Status: DC
  Administered 2017-05-15 – 2017-05-21 (×7): 100 mg via ORAL
  Filled 2017-05-15 (×7): qty 5

## 2017-05-15 MED ORDER — LEVETIRACETAM 500 MG PO TABS
1500.0000 mg | ORAL_TABLET | Freq: Every day | ORAL | Status: DC
  Administered 2017-05-15 – 2017-05-20 (×6): 1500 mg via ORAL
  Filled 2017-05-15 (×6): qty 3

## 2017-05-15 MED ORDER — FLUTICASONE PROPIONATE 50 MCG/ACT NA SUSP
1.0000 | Freq: Every day | NASAL | Status: DC
  Administered 2017-05-16 – 2017-05-21 (×6): 1 via NASAL
  Filled 2017-05-15: qty 16

## 2017-05-15 MED ORDER — OSELTAMIVIR PHOSPHATE 30 MG PO CAPS
30.0000 mg | ORAL_CAPSULE | ORAL | Status: DC
  Administered 2017-05-15 – 2017-05-17 (×2): 30 mg via ORAL
  Filled 2017-05-15 (×3): qty 1

## 2017-05-15 MED ORDER — ALBUTEROL SULFATE (2.5 MG/3ML) 0.083% IN NEBU
2.5000 mg | INHALATION_SOLUTION | RESPIRATORY_TRACT | Status: DC | PRN
  Administered 2017-05-17 – 2017-05-18 (×2): 2.5 mg via RESPIRATORY_TRACT
  Filled 2017-05-15 (×2): qty 3

## 2017-05-15 MED ORDER — CALCIUM GLUCONATE 10 % IV SOLN
INTRAVENOUS | Status: AC
  Filled 2017-05-15: qty 10

## 2017-05-15 MED ORDER — CARBOXYMETHYLCELLULOSE SODIUM 1 % OP SOLN: 1.0000 [drp] | Freq: Three times a day (TID) | OPHTHALMIC | Status: DC

## 2017-05-15 MED ORDER — SEVELAMER CARBONATE 800 MG PO TABS
800.0000 mg | ORAL_TABLET | Freq: Three times a day (TID) | ORAL | Status: DC
  Administered 2017-05-15 – 2017-05-21 (×15): 800 mg via ORAL
  Filled 2017-05-15 (×16): qty 1

## 2017-05-15 MED ORDER — PENTAFLUOROPROP-TETRAFLUOROETH EX AERO
1.0000 "application " | INHALATION_SPRAY | CUTANEOUS | Status: DC | PRN
  Filled 2017-05-15: qty 30

## 2017-05-15 MED ORDER — VANCOMYCIN HCL IN DEXTROSE 1-5 GM/200ML-% IV SOLN: 1000.0000 mg | Freq: Once | INTRAVENOUS | Status: DC

## 2017-05-15 MED ORDER — ASPIRIN 81 MG PO CHEW
81.0000 mg | CHEWABLE_TABLET | Freq: Every day | ORAL | Status: DC
  Administered 2017-05-15 – 2017-05-21 (×7): 81 mg via ORAL
  Filled 2017-05-15 (×6): qty 1

## 2017-05-15 MED ORDER — ACETAMINOPHEN 325 MG PO TABS
650.0000 mg | ORAL_TABLET | Freq: Four times a day (QID) | ORAL | Status: DC | PRN
  Administered 2017-05-15 – 2017-05-20 (×5): 650 mg via ORAL
  Filled 2017-05-15 (×5): qty 2

## 2017-05-15 MED ORDER — HYDRALAZINE HCL 20 MG/ML IJ SOLN
10.0000 mg | Freq: Four times a day (QID) | INTRAMUSCULAR | Status: DC | PRN
  Administered 2017-05-19 – 2017-05-20 (×2): 10 mg via INTRAVENOUS
  Filled 2017-05-15 (×2): qty 1

## 2017-05-15 MED ORDER — AMLODIPINE BESYLATE 10 MG PO TABS
10.0000 mg | ORAL_TABLET | Freq: Every day | ORAL | Status: DC
  Administered 2017-05-15 – 2017-05-21 (×7): 10 mg via ORAL
  Filled 2017-05-15 (×6): qty 1

## 2017-05-15 MED ORDER — LACTULOSE 10 GM/15ML PO SOLN
10.0000 g | Freq: Every day | ORAL | Status: DC
  Administered 2017-05-16 – 2017-05-19 (×3): 10 g via ORAL
  Filled 2017-05-15 (×6): qty 30

## 2017-05-15 MED ORDER — MELATONIN 5 MG PO TABS
5.0000 mg | ORAL_TABLET | Freq: Every day | ORAL | Status: DC
  Administered 2017-05-15 – 2017-05-20 (×5): 5 mg via ORAL
  Filled 2017-05-15 (×7): qty 1

## 2017-05-15 MED ORDER — ISOSORBIDE MONONITRATE ER 60 MG PO TB24
90.0000 mg | ORAL_TABLET | Freq: Every day | ORAL | Status: DC
  Administered 2017-05-15 – 2017-05-20 (×6): 90 mg via ORAL
  Filled 2017-05-15 (×6): qty 1

## 2017-05-15 MED ORDER — SODIUM CHLORIDE 0.9 % IV SOLN
1.0000 g | Freq: Once | INTRAVENOUS | Status: DC
  Filled 2017-05-15: qty 10

## 2017-05-15 MED ORDER — OSELTAMIVIR PHOSPHATE 75 MG PO CAPS: 75.0000 mg | ORAL_CAPSULE | Freq: Two times a day (BID) | ORAL | Status: DC

## 2017-05-15 MED ORDER — LIDOCAINE-PRILOCAINE 2.5-2.5 % EX CREA
1.0000 "application " | TOPICAL_CREAM | CUTANEOUS | Status: DC | PRN
  Filled 2017-05-15: qty 5

## 2017-05-15 MED ORDER — CHLORHEXIDINE GLUCONATE CLOTH 2 % EX PADS
6.0000 | MEDICATED_PAD | Freq: Every day | CUTANEOUS | Status: AC
  Administered 2017-05-16 – 2017-05-19 (×4): 6 via TOPICAL

## 2017-05-15 MED ORDER — ONDANSETRON HCL 4 MG PO TABS: 4.0000 mg | ORAL_TABLET | Freq: Four times a day (QID) | ORAL | Status: DC | PRN

## 2017-05-15 MED ORDER — HEPARIN SODIUM (PORCINE) 1000 UNIT/ML DIALYSIS
1000.0000 [IU] | INTRAMUSCULAR | Status: DC | PRN
  Filled 2017-05-15: qty 1

## 2017-05-15 MED ORDER — BISACODYL 10 MG RE SUPP: 10.0000 mg | RECTAL | Status: DC | PRN

## 2017-05-15 MED ORDER — BENZTROPINE MESYLATE 0.5 MG PO TABS
0.5000 mg | ORAL_TABLET | Freq: Two times a day (BID) | ORAL | Status: DC
  Administered 2017-05-15 – 2017-05-21 (×12): 0.5 mg via ORAL
  Filled 2017-05-15 (×13): qty 1

## 2017-05-15 NOTE — Progress Notes (Signed)
Post HD assessment  

## 2017-05-15 NOTE — H&P (Signed)
[facility] - [place] at [facility]   PATIENT NAME: Caragh Gasper    MR#:  [id number]  DATE OF BIRTH:  1965/05/22  DATE OF ADMISSION:  05/15/2017  PRIMARY CARE PHYSICIAN: Dorothey Baseman, MD   REQUESTING/REFERRING PHYSICIAN:   CHIEF COMPLAINT:   Chief Complaint  Patient presents with  . Altered Mental Status    HISTORY OF PRESENT ILLNESS: Keylee Shrestha  is a 52 y.o. female with a known history of bipolar disorder, coronary artery disease, congestive heart failure, end-stage renal disease on dialysis, GERD was referred from the facility for confusion and lethargy.  Patient gets dialyzed on Monday Wednesday and Friday.  She was evaluated in the emergency room her potassium level was 7.1 and she was given IV calcium IV insulin and D50 along with albuterol nebulization.  Patient tested positive for flu and was going to be started on Tamiflu.  Patient also complains of cough productive of phlegm and had a fever at the time of presentation.  Patient became more alert and awake in the emergency room and responsive.  She was oriented to time place and person and responding to verbal commands.  No history of fall or head injury.  Also complains of nausea and vomiting.  PAST MEDICAL HISTORY:   Past Medical History:  Diagnosis Date  . Anginal pain (HCC)   . Anxiety   . Bipolar disorder (HCC)   . CAD (coronary artery disease)   . CHF (congestive heart failure) (HCC)   . Chronic lower back pain   . Depression   . Endometriosis   . ESRD (end stage renal disease) on dialysis (HCC)    "[facility]; [address]; [place]; TTS" (01/19/2016)  . Gastroparesis   . GERD (gastroesophageal reflux disease)   . History of blood transfusion "several"   "my blood would get low; low RBC"  . History of hiatal hernia   . HLD (hyperlipidemia)   . Hypertension   . Migraine    "monthly" (01/19/2016)  . Myocardial infarction (HCC) 2017   "~ 3 wks ago" (01/19/2016)  . Renal disorder   .  Renal insufficiency   . Seizures (HCC) 07/2015   "I've only had the 1; don't know what from" (01/19/2016)  . Type II diabetes mellitus (HCC)     PAST SURGICAL HISTORY:  Past Surgical History:  Procedure Laterality Date  . ABDOMINAL HYSTERECTOMY     "partial"  . AV FISTULA PLACEMENT Right 08/23/2016   Procedure: ARTERIOVENOUS (AV) FISTULA CREATION  ( BRACHIAL CEPHALIC );  Surgeon: Renford Dills, MD;  Location: [facility] ORS;  Service: Vascular;  Laterality: Right;  . BELOW KNEE LEG AMPUTATION Right 2010?  [name] CARDIAC CATHETERIZATION Right 07/10/2015   Procedure: Left Heart Cath and Coronary Angiography;  Surgeon: Laurier Nancy, MD;  Location: [facility] INVASIVE CV LAB;  Service: Cardiovascular;  Laterality: Right;  . CARDIAC CATHETERIZATION Right 09/14/2015   Procedure: Left Heart Cath and Coronary Angiography;  Surgeon: Laurier Nancy, MD;  Location: [facility] INVASIVE CV LAB;  Service: Cardiovascular;  Laterality: Right;  . CARDIAC CATHETERIZATION N/A 09/14/2015   Procedure: Coronary Stent Intervention;  Surgeon: Alwyn Pea, MD;  Location: [facility] INVASIVE CV LAB;  Service: Cardiovascular;  Laterality: N/A;  . CARDIAC CATHETERIZATION Right 11/20/2015   Procedure: Left Heart Cath and Coronary Angiography;  Surgeon: Laurier Nancy, MD;  Location: [facility] INVASIVE CV LAB;  Service: Cardiovascular;  Laterality: Right;  . CATARACT EXTRACTION, BILATERAL    . CORONARY ANGIOPLASTY WITH STENT PLACEMENT  <  2017   @ [facility]/notes 07/03/2013  . CORONARY ANGIOPLASTY WITH STENT PLACEMENT    . DIALYSIS/PERMA CATHETER REMOVAL N/A 11/12/2016   Procedure: DIALYSIS/PERMA CATHETER REMOVAL;  Surgeon: Renford Dills, MD;  Location: [facility] INVASIVE CV LAB;  Service: Cardiovascular;  Laterality: N/A;  . HYSTEROTOMY    . PERIPHERAL VASCULAR CATHETERIZATION N/A 08/30/2015   Procedure: Dialysis/Perma Catheter Insertion;  Surgeon: Annice Needy, MD;  Location: [facility] INVASIVE CV LAB;  Service: Cardiovascular;  Laterality: N/A;  .  PERIPHERAL VASCULAR CATHETERIZATION N/A 01/01/2016   Procedure: Dialysis/Perma Catheter Insertion;  Surgeon: Annice Needy, MD;  Location: [facility] INVASIVE CV LAB;  Service: Cardiovascular;  Laterality: N/A;  . PERITONEAL CATHETER INSERTION  11/03/2013   [name] 11/03/2013  . PERITONEAL CATHETER REMOVAL  01/01/2016   "took the one from May out; put new PD cath in" (01/19/2016)  . PERITONEAL CATHETER REMOVAL  11/03/2013; 02/11/2014   [name] 11/03/2013; Removal of tunneled catheter/notes 02/11/2014  . SALPINGOOPHORECTOMY Right    [name] 07/03/2013  . TEE WITHOUT CARDIOVERSION N/A 06/07/2016   Procedure: TRANSESOPHAGEAL ECHOCARDIOGRAM (TEE);  Surgeon: Chrystie Nose, MD;  Location: [facility] ENDOSCOPY;  Service: Cardiovascular;  Laterality: N/A;  . TUBAL LIGATION      SOCIAL HISTORY:  Social History   Tobacco Use  . Smoking status: Former Smoker    Packs/day: 0.75    Types: Cigarettes    Last attempt to quit: 07/01/2015    Years since quitting: 1.8  . Smokeless tobacco: Never Used  Substance Use Topics  . Alcohol use: No    Alcohol/week: 0.0 oz    FAMILY HISTORY:  Family History  Problem Relation Age of Onset  . CAD Unknown   . Diabetes Unknown   . Bipolar disorder Unknown   . Cervical cancer Mother   . Other Father        heart bypass  . Breast cancer Neg Hx     DRUG ALLERGIES:  Allergies  Allergen Reactions  . Cephalosporins Anaphylaxis    Patient has tolerated meropenem.   [name] Penicillins Anaphylaxis and Other (See Comments)    Has patient had a PCN reaction causing immediate rash, facial/tongue/throat swelling, SOB or lightheadedness with hypotension: Yes Has patient had a PCN reaction causing severe rash involving mucus membranes or skin necrosis: No Has patient had a PCN reaction that required hospitalization No Has patient had a PCN reaction occurring within the last 10 years: No If all of the above answers are "NO", then may proceed with Cephalosporin use.  . Reglan [Metoclopramide]  Other (See Comments)    Severe EPS (lip, head, body tremors) March 2018  . Risperdal [Risperidone] Other (See Comments)    Severe EPS (lip, head, body tremors) March 2018  . Lamictal [Lamotrigine] Other (See Comments)    Reaction:  Hallucinations  . Phenergan [Promethazine Hcl] Nausea And Vomiting    Sensitivity to medicine  . Pravastatin Other (See Comments)    Reaction:  Muscle pain   . Sulfa Antibiotics Other (See Comments)    Reaction:  Unknown     REVIEW OF SYSTEMS:   CONSTITUTIONAL: Has fever, fatigue and weakness.  EYES: No blurred or double vision.  EARS, NOSE, AND THROAT: No tinnitus or ear pain.  RESPIRATORY: Has cough, no shortness of breath, wheezing or hemoptysis.  CARDIOVASCULAR: No chest pain, orthopnea, edema.  GASTROINTESTINAL: Has nausea, vomiting,  No diarrhea or abdominal pain.  GENITOURINARY: No dysuria, hematuria.  ENDOCRINE: No polyuria, nocturia,  HEMATOLOGY: No anemia, easy bruising or bleeding  SKIN: No rash or lesion. MUSCULOSKELETAL: No joint pain or arthritis.   NEUROLOGIC: No tingling, numbness, weakness.  PSYCHIATRY: No anxiety or depression.   MEDICATIONS AT HOME:  Prior to Admission medications   Medication Sig Start Date End Date Taking? Authorizing Provider  Amino Acids-Protein Hydrolys (FEEDING SUPPLEMENT, PRO-STAT SUGAR FREE 64,) LIQD Take 30 mLs by mouth 3 (three) times daily.     [provider]  amLODipine (NORVASC) 10 MG tablet Take 10 mg by mouth daily.     [provider]  aspirin 81 MG chewable tablet Chew 81 mg by mouth daily.    [provider]  atorvastatin (LIPITOR) 40 MG tablet Take 40 mg by mouth daily at 6 PM.    [provider]  benztropine (COGENTIN) 0.5 MG tablet Take 0.5 mg by mouth 2 (two) times daily.    [provider]  bisacodyl (DULCOLAX) 10 MG suppository Place 10 mg rectally as needed for moderate constipation.    [provider]  carboxymethylcellulose (REFRESH)  1 % ophthalmic solution Place 1 drop into both eyes 3 (three) times daily.    [provider]  clonazePAM (KLONOPIN) 0.5 MG tablet Take 0.5 mg by mouth at bedtime.    [provider]  clopidogrel (PLAVIX) 75 MG tablet Take 75 mg by mouth daily at 12 noon.     [provider]  fluticasone (FLONASE) 50 MCG/ACT nasal spray Place 1 spray into both nostrils daily.     [provider]  gabapentin (NEURONTIN) 300 MG capsule Take 300 mg by mouth daily.     [provider]  hydrALAZINE (APRESOLINE) 50 MG tablet Take 1 tablet (50 mg total) by mouth 3 (three) times daily. Patient not taking: Reported on 04/03/2017 06/09/16   Leroy Sea, MD  HYDROcodone-acetaminophen (NORCO) 5-325 MG tablet Take 1-2 tablets by mouth every 6 (six) hours as needed for moderate pain or severe pain. 08/23/16   Schnier, Latina Craver, MD  insulin aspart (NOVOLOG) 100 UNIT/ML injection Inject 8 Units into the skin 3 (three) times daily with meals.     [provider]  insulin detemir (LEVEMIR) 100 UNIT/ML injection Inject 9 Units into the skin at bedtime.     [provider]  isosorbide mononitrate (IMDUR) 30 MG 24 hr tablet Take 90 mg by mouth daily at 12 noon.     [provider]  lactulose (CHRONULAC) 10 GM/15ML solution Take 10 g by mouth daily.     [provider]  levETIRAcetam (KEPPRA) 750 MG tablet Take 1,500 mg by mouth daily at 12 noon.    [provider]  lidocaine (LIDODERM) 5 % Place 1 patch onto the skin at bedtime.     [provider]  lidocaine (LMX) 4 % cream Apply 1 application topically daily as needed (pain). To right arm fistula    [provider]  Melatonin 3 MG TABS Take 3 mg by mouth at bedtime.    [provider]  metoprolol (LOPRESSOR) 50 MG tablet Take 1 tablet (50 mg total) by mouth 2 (two) times daily. 09/15/15   Milagros Loll, MD  nitroGLYCERIN (NITROSTAT) 0.4 MG SL tablet Place 0.4 mg  under the tongue every 5 (five) minutes as needed for chest pain. Reported on 07/10/2015    [provider]  omeprazole (PRILOSEC) 20 MG capsule Take 20 mg by mouth 2 (two) times daily.     [provider]  ondansetron (ZOFRAN ODT) 4 MG disintegrating tablet Take  1 tablet (4 mg total) by mouth every 6 (six) hours as needed for nausea or vomiting. Patient taking differently: Take 4 mg by mouth every 8 (eight) hours as needed for nausea or vomiting.  07/16/15   Sharyn Creamer, MD  OXcarbazepine (TRILEPTAL) 150 MG tablet Take 150 mg by mouth at bedtime.    [provider]  oxyCODONE-acetaminophen (PERCOCET/ROXICET) 5-325 MG tablet Take 1 tablet by mouth every 12 (twelve) hours as needed for severe pain. Do not exceed 4gm of Tylenol in 24 hours Patient not taking: Reported on 01/07/2017 06/09/16   Leroy Sea, MD  polyethylene glycol (MIRALAX / GLYCOLAX) packet Take 17 g by mouth daily. Patient taking differently: Take 17 g by mouth daily at 12 noon.  08/02/15   Adrian Saran, MD  senna (SENOKOT) 8.6 MG TABS tablet Take 2 tablets by mouth 2 (two) times daily.     [provider]  sevelamer carbonate (RENVELA) 800 MG tablet Take 800 mg by mouth See admin instructions. Take 1 tablet four times a day. (with each meal and with bedtime snack.)    [provider]  torsemide (DEMADEX) 100 MG tablet Take 100 mg by mouth daily.     [provider]      PHYSICAL EXAMINATION:   VITAL SIGNS: Blood pressure (!) 129/54, pulse 78, temperature (!) 101.8 F (38.8 C), temperature source Oral, resp. rate (!) 22, height 5\' 6"  (1.676 m), weight 103.4 kg (228 lb), last menstrual period 03/16/2015, SpO2 98 %.  GENERAL:  52 y.o.-year-old patient lying in the bed with no acute distress.  EYES: Pupils equal, round, reactive to light and accommodation. No scleral icterus. Extraocular muscles intact.  HEENT: Head atraumatic, normocephalic. Oropharynx dry and nasopharynx clear.   NECK:  Supple, no jugular venous distention. No thyroid enlargement, no tenderness.  LUNGS: Normal breath sounds bilaterally, no wheezing, rales,rhonchi or crepitation. No use of accessory muscles of respiration.  CARDIOVASCULAR: S1, S2 normal. No murmurs, rubs, or gallops.  ABDOMEN: Soft, nontender, nondistended. Bowel sounds present. No organomegaly or mass.  EXTREMITIES: No pedal edema, cyanosis, or clubbing.  Right BKA noted NEUROLOGIC: Cranial nerves II through XII are intact. Muscle strength 5/5 in all extremities. Sensation intact. Gait not checked.  PSYCHIATRIC: The patient is alert and oriented x 3.  SKIN: No obvious rash, lesion, or ulcer.   LABORATORY PANEL:   CBC Recent Labs  Lab 05/15/17 0201  WBC 9.6  HGB 10.5*  HCT 32.1*  PLT 228  MCV 92.0  MCH 30.2  MCHC 32.8  RDW 14.4  LYMPHSABS 0.3*  MONOABS 0.3  EOSABS 0.0  BASOSABS 0.0   ------------------------------------------------------------------------------------------------------------------  Chemistries  Recent Labs  Lab 05/15/17 0201  NA 133*  K 7.1*  CL 93*  CO2 25  GLUCOSE 165*  BUN 65*  CREATININE 7.35*  CALCIUM 8.8*  AST 34  ALT 13*  ALKPHOS 157*  BILITOT 0.8   ------------------------------------------------------------------------------------------------------------------ estimated creatinine clearance is 10.7 mL/min (A) (by C-G formula based on SCr of 7.35 mg/dL (H)). ------------------------------------------------------------------------------------------------------------------ No results for input(s): TSH, T4TOTAL, T3FREE, THYROIDAB in the last 72 hours.  Invalid input(s): FREET3   Coagulation profile No results for input(s): INR, PROTIME in the last 168 hours. ------------------------------------------------------------------------------------------------------------------- No results for input(s): DDIMER in the last 72  hours. -------------------------------------------------------------------------------------------------------------------  Cardiac Enzymes No results for input(s): CKMB, TROPONINI, MYOGLOBIN in the last 168 hours.  Invalid input(s): CK ------------------------------------------------------------------------------------------------------------------ Invalid input(s): POCBNP  ---------------------------------------------------------------------------------------------------------------  Urinalysis    Component Value Date/Time  COLORURINE YELLOW (A) 05/15/2017 0200   APPEARANCEUR HAZY (A) 05/15/2017 0200   APPEARANCEUR Clear 12/31/2013 1818   LABSPEC 1.017 05/15/2017 0200   LABSPEC 1.009 12/31/2013 1818   PHURINE 6.0 05/15/2017 0200   GLUCOSEU 50 (A) 05/15/2017 0200   GLUCOSEU 150 mg/dL 76/81/1572 6203   HGBUR MODERATE (A) 05/15/2017 0200   BILIRUBINUR NEGATIVE 05/15/2017 0200   BILIRUBINUR Negative 12/31/2013 1818   KETONESUR NEGATIVE 05/15/2017 0200   PROTEINUR 100 (A) 05/15/2017 0200   NITRITE NEGATIVE 05/15/2017 0200   LEUKOCYTESUR NEGATIVE 05/15/2017 0200   LEUKOCYTESUR Negative 12/31/2013 1818     RADIOLOGY: Dg Chest Port 1 View  Result Date: 05/15/2017 CLINICAL DATA:  Altered mental status with fever and hypoxia. EXAM: PORTABLE CHEST 1 VIEW COMPARISON:  03/28/2017 FINDINGS: Stable cardiomegaly with aortic atherosclerosis. New pulmonary consolidation in the right upper lobe compatible pneumonia. Mild central vascular congestion is noted. No effusion or pneumothorax. No acute osseous abnormality. IMPRESSION: Cardiomegaly with aortic atherosclerosis. Pulmonary consolidation in the right upper lobe consistent with pneumonia. Electronically Signed   By: Tollie Eth M.D.   On: 05/15/2017 02:26    EKG: Orders placed or performed during the hospital encounter of 05/15/17  . ED EKG 12-Lead  . ED EKG 12-Lead    IMPRESSION AND PLAN: 52 year old female patient with history of  end-stage renal disease on dialysis, hyperlipidemia, hypertension, coronary disease, congestive heart failure presented to the emergency room with lethargy and confusion.  Admitting diagnosis 1.  Healthcare associated pneumonia 2.  Flu syndrome 3.  Hyperkalemia 4.  End-stage renal disease on dialysis Treatment plan Admit patient to telemetry inpatient service Start patient on IV vancomycin, IV aztreonam and IV Levaquin antibiotic Patient received albuterol nebulization, calcium, D50 for hyperkalemia Nephrology consult for dialysis Start patient on oral Tamiflu Follow-up cultures DVT prophylaxis subcu heparin  All the records are reviewed and case discussed with ED provider. Management plans discussed with the patient, family and they are in agreement.  CODE STATUS:FULL CODE Code Status History    Date Active Date Inactive Code Status Order ID Comments User Context   05/31/2016 16:06 06/11/2016 20:13 Full Code [id number]  Kathlene Cote, PA-C ED   03/13/2016 22:36 03/16/2016 20:33 Full Code [id number]  Oralia Manis, MD ED   02/25/2016 17:58 02/27/2016 23:32 Full Code [id number]  Shaune Pollack, MD Inpatient   01/19/2016 04:54 01/20/2016 21:03 Full Code [id number]  Hillary Bow, DO ED   12/25/2015 20:01 01/04/2016 20:24 Full Code [id number]  Houston Siren, MD Inpatient   11/19/2015 01:17 11/21/2015 19:56 Full Code [id number]  Tonye Royalty, DO Inpatient   10/06/2015 02:08 10/06/2015 18:32 Full Code [id number]  Ihor Austin, MD Inpatient   09/13/2015 02:14 09/15/2015 17:28 Full Code [id number]  Arnaldo Natal, MD Inpatient   08/24/2015 03:50 08/31/2015 18:47 Full Code [id number]  Ihor Austin, MD Inpatient   07/16/2015 06:26 08/02/2015 18:48 Full Code [id number]  Ihor Austin, MD ED   07/10/2015 01:49 07/12/2015 13:01 Full Code [id number]  Oralia Manis, MD Inpatient   05/12/2015 15:46 05/16/2015 17:35 Full Code [id number]  Auburn Bilberry, MD ED       TOTAL TIME TAKING CARE OF THIS PATIENT: 54  minutes.    Ihor Austin M.D on 05/15/2017 at 5:02 AM  Between 7am to 6pm - Pager - [phone]  After 6pm go to [email or web address] - password [email or web address] [facility]  [facility]  Office  [phone]  CC: Primary care physician; Dorothey Baseman, MD

## 2017-05-15 NOTE — Progress Notes (Signed)
[facility]  ROUNDING NOTE   Subjective:  Patient well-known to [place] from prior admissions. She presented with altered mental status. She presented with severe hyperkalemia with a serum potassium of 7.1. Therefore urgent dialysis was requested. Our nurse is currently dialyzing the patient.   Objective:  Vital signs in last 24 hours:  Temp:  [98.2 F (36.8 C)-101.8 F (38.8 C)] 99.8 F (37.7 C) (02/14 0730) Pulse Rate:  [68-93] 90 (02/14 1135) Resp:  [10-30] 18 (02/14 1135) BP: (96-174)/(38-92) 174/55 (02/14 1135) SpO2:  [94 %-100 %] 97 % (02/14 1135) Weight:  [103.4 kg (228 lb)-116 kg (255 lb 12.8 oz)] 106.7 kg (235 lb 3.7 oz) (02/14 0730)  Weight change:  Filed Weights   05/15/17 0156 05/15/17 0535 05/15/17 0730  Weight: 103.4 kg (228 lb) 116 kg (255 lb 12.8 oz) 106.7 kg (235 lb 3.7 oz)    Intake/Output: I/O last 3 completed shifts: In: 450 [IV Piggyback:450] Out: -    Intake/Output this shift:  No intake/output data recorded.  Physical Exam: General: No acute distress  Head: Normocephalic, atraumatic. Moist oral mucosal membranes  Eyes: Anicteric  Neck: Supple, trachea midline  Lungs:  Clear to auscultation, normal effort  Heart: S1S2 no rubs  Abdomen:  Soft, nontender, bowel sounds present  Extremities:  peripheral edema.  Neurologic: Awake, alert, following commands  Skin: No lesions       Basic Metabolic Panel: Recent Labs  Lab 05/15/17 0201 05/15/17 0624  NA 133* 132*  K 7.1* 6.7*  CL 93* 94*  CO2 25 24  GLUCOSE 165* 135*  BUN 65* 67*  CREATININE 7.35* 7.64*  CALCIUM 8.8* 8.9  PHOS  --  5.1*    Liver Function Tests: Recent Labs  Lab 05/15/17 0201  AST 34  ALT 13*  ALKPHOS 157*  BILITOT 0.8  PROT 7.4  ALBUMIN 3.8   No results for input(s): LIPASE, AMYLASE in the last 168 hours. No results for input(s): AMMONIA in the last 168 hours.  CBC: Recent Labs  Lab 05/15/17 0201 05/15/17 0624  WBC 9.6 6.9  NEUTROABS 8.9*   --   HGB 10.5* 10.0*  HCT 32.1* 30.2*  MCV 92.0 91.5  PLT 228 200    Cardiac Enzymes: No results for input(s): CKTOTAL, CKMB, CKMBINDEX, TROPONINI in the last 168 hours.  BNP: Invalid input(s): POCBNP  CBG: Recent Labs  Lab 05/15/17 0644  GLUCAP 122*    Microbiology: Results for orders placed or performed during the hospital encounter of 05/15/17  Blood Culture (routine x 2)     Status: None (Preliminary result)   Collection Time: 05/15/17  2:01 AM  Result Value Ref Range Status   Specimen Description BLOOD LEFT HAND  Final   Special Requests   Final    BOTTLES DRAWN AEROBIC AND ANAEROBIC Blood Culture adequate volume   Culture   Final    NO GROWTH < 12 HOURS Performed at [facility], [address], [place], [place] [address]    Report Status PENDING  Incomplete  Blood Culture (routine x 2)     Status: None (Preliminary result)   Collection Time: 05/15/17  2:01 AM  Result Value Ref Range Status   Specimen Description BLOOD RIGHT FA  Final   Special Requests   Final    BOTTLES DRAWN AEROBIC AND ANAEROBIC Blood Culture adequate volume   Culture   Final    NO GROWTH < 12 HOURS Performed at [facility], [address] [place],  [place] [address]    Report Status PENDING  Incomplete  MRSA PCR Screening     Status: Abnormal   Collection Time: 05/15/17  5:35 AM  Result Value Ref Range Status   MRSA by PCR POSITIVE (A) NEGATIVE Final    Comment:        The GeneXpert MRSA Assay (FDA approved for NASAL specimens only), is one component of a comprehensive MRSA colonization surveillance program. It is not intended to diagnose MRSA infection nor to guide or monitor treatment for MRSA infections. RESULT CALLED TO, READ BACK BY AND VERIFIED WITH: AMY DALTON AT 0802 ON 05/15/17 [facility]. Performed at [facility], [address] [place], [place] [address]     Coagulation Studies: No results for input(s): LABPROT, INR in the last 72  hours.  Urinalysis: Recent Labs    05/14/17 1530 05/15/17 0200  COLORURINE YELLOW* YELLOW*  LABSPEC 1.012 1.017  PHURINE 8.0 6.0  GLUCOSEU NEGATIVE 50*  HGBUR SMALL* MODERATE*  BILIRUBINUR NEGATIVE NEGATIVE  KETONESUR NEGATIVE NEGATIVE  PROTEINUR 100* 100*  NITRITE NEGATIVE NEGATIVE  LEUKOCYTESUR LARGE* NEGATIVE      Imaging: Dg Chest Port 1 View  Result Date: 05/15/2017 CLINICAL DATA:  Altered mental status with fever and hypoxia. EXAM: PORTABLE CHEST 1 VIEW COMPARISON:  03/28/2017 FINDINGS: Stable cardiomegaly with aortic atherosclerosis. New pulmonary consolidation in the right upper lobe compatible pneumonia. Mild central vascular congestion is noted. No effusion or pneumothorax. No acute osseous abnormality. IMPRESSION: Cardiomegaly with aortic atherosclerosis. Pulmonary consolidation in the right upper lobe consistent with pneumonia. Electronically Signed   By: Tollie Eth M.D.   On: 05/15/2017 02:26     Medications:   . sodium chloride    . sodium chloride    . sodium chloride    . aztreonam    . [START ON 05/17/2017] levofloxacin (LEVAQUIN) IV    . vancomycin    . vancomycin     . amLODipine  10 mg Oral Daily  . aspirin  81 mg Oral Daily  . atorvastatin  40 mg Oral q1800  . benztropine  0.5 mg Oral BID  . chlorhexidine  15 mL Mouth Rinse BID  . clonazePAM  0.5 mg Oral QHS  . clopidogrel  75 mg Oral Q1200  . feeding supplement (PRO-STAT SUGAR FREE 64)  30 mL Oral TID  . fluticasone  1 spray Each Nare Daily  . heparin  5,000 Units Subcutaneous Q8H  . isosorbide mononitrate  90 mg Oral Q1200  . lactulose  10 g Oral Daily  . levETIRAcetam  1,500 mg Oral Q1200  . mouth rinse  15 mL Mouth Rinse q12n4p  . Melatonin  5 mg Oral QHS  . metoprolol tartrate  50 mg Oral BID  . oseltamivir  30 mg Oral Once  . oseltamivir  30 mg Oral Q T,Th,Sat-1800  . OXcarbazepine  150 mg Oral QHS  . pantoprazole  40 mg Oral Daily  . polyvinyl alcohol  1 drop Both Eyes TID  . senna   2 tablet Oral BID  . sevelamer carbonate  800 mg Oral TID WC  . sodium chloride flush  3 mL Intravenous Q12H  . torsemide  100 mg Oral Daily   sodium chloride, sodium chloride, sodium chloride, acetaminophen **OR** acetaminophen, alteplase, bisacodyl, heparin, lidocaine (PF), lidocaine-prilocaine, nitroGLYCERIN, ondansetron **OR** ondansetron (ZOFRAN) IV, pentafluoroprop-tetrafluoroeth, sodium chloride flush  Assessment/ Plan:  52 y.o. female with end-stage renal disease on hemodialysis, asthma, gastroparesis, diabetes mellitus type II, congestive heart failure, hypertension, endometriosis,  coronary atherosclerosis, drug-eluting stent to LAD 2017  [facility] Nephrology/ [address]/ TTS   1.  ESRD on HD TTS.  Patient seen and evaluated during hemodialysis tolerating well.  We will maintain the patient on a dialysis schedule of Tuesday, Thursday, Saturday.  2.  Hyperkalemia.  Serum potassium was quite high upon admission at 7.1.  We will maintain the patient on a 1K bath during treatment.  Follow-up serum potassium tomorrow.  3.  Secondary hyperparathyroidism.  Maintain the patient on Renvela 800 mg p.o. 3 times daily with meals and follow-up serum phosphorus.  4.  Anemia of chronic kidney disease.  Hemoglobin currently 10.0.  Hold off on Epogen for now.     LOS: 0 Kimberly Montgomery 2/14/201911:42 AM

## 2017-05-15 NOTE — ED Notes (Signed)
Patient is complaining of pain all over, is now having conversations.

## 2017-05-15 NOTE — Progress Notes (Signed)
Initial Nutrition Assessment  DOCUMENTATION CODES:   Obesity unspecified  INTERVENTION:   Recommend SLP evaluation to determine aspiration risk   Nepro Shake po BID, each supplement provides 425 kcal and 19 grams protein  Magic cup TID with meals, each supplement provides 290 kcal and 9 grams of protein  Rena-vite daily  Liberalize diet  Prostat liquid protein PO 30 ml TID with meals, each supplement provides 100 kcal, 15 grams protein. (home medicine)   NUTRITION DIAGNOSIS:   Inadequate oral intake related to acute illness, lethargy/confusion as evidenced by meal completion < 25%.  GOAL:   Patient will meet greater than or equal to 90% of their needs  MONITOR:   PO intake, Supplement acceptance, Weight trends, Labs, I & O's  REASON FOR ASSESSMENT:   Malnutrition Screening Tool    ASSESSMENT:   52 y.o. female with a known history of bipolar disorder, coronary artery disease, congestive heart failure, end-stage renal disease on dialysis, GERD was referred from the facility for confusion and lethargy.     Visited pt's room today; unable to speak with pt r/t AMS. Pt admitted from [facility] with AMS. Pt s/p HD today. Per chart, pt is weight stable. Pt has been unable to eat anything today. Spoke to RN, pt was given some applesauce and water and seemed to do ok with this. Pt may benefit from SLP evaluation to determine if she is at aspiration risk. RD will add supplements to help pt meet her estimated protein needs. RD will also liberalize pt's diet until her oral intake improves.    Medications reviewed and include: aspirin, heparin, lactulose, protonix, senokot, renvela, torsemide, aztreonam, vancomycin  Labs reviewed: Na 132(L), K 6.7(H), Cl 94(L), BUN 67(H), creat 7.64(H), P 5.1(H) Hgb 10.0(L), Hct 30.2(L) cbgs- 165, 135 x 24 hrs  Nutrition-Focused physical exam completed. Findings are no fat depletion, no muscle depletion, and no edema.   Diet Order:  Diet  heart healthy/carb modified Room service appropriate? 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  Estimated Nutritional Needs:   Kcal:  1800-2100kcal/day   Protein:  107-118g/day   Fluid:  per MD  Betsey Holiday MS, RD, LDN Pager #[phone] After Hours Pager: [phone]

## 2017-05-15 NOTE — Progress Notes (Signed)
HD tx start 

## 2017-05-15 NOTE — Progress Notes (Signed)
HD tx end  

## 2017-05-15 NOTE — Progress Notes (Signed)
Notified Dr. Sherryll Burger about patient wheeziness, order for PRN breathing treatment given. RN will continue to monitor.

## 2017-05-15 NOTE — Progress Notes (Signed)
CODE SEPSIS - PHARMACY COMMUNICATION  **Broad Spectrum Antibiotics should be administered within 1 hour of Sepsis diagnosis**  Time Code Sepsis Called/Page Received: 0203  Antibiotics Ordered: vanc/aztreonam/levaquin  Time of 1st antibiotic administration: 0216  Additional action taken by pharmacy:   If necessary, Name of Provider/Nurse Contacted:     Thomasene Ripple ,PharmD Clinical Pharmacist  05/15/2017  4:55 AM

## 2017-05-15 NOTE — Progress Notes (Signed)
Same day progress note  52 year old female patient with history of end-stage renal disease on dialysis, hyperlipidemia, hypertension, coronary disease, congestive heart failure admitted for lethargy and confusion.  1.  Healthcare associated pneumonia: continue IV Abx 2.  Flu like syndrome: symptomatic mgmt, tamilfu 3.  Hyperkalemia: should correct with HD today 4.  End-stage renal disease on dialysis 5.  Abd pain, N/V: will get CT A-P to r/o acute patho  Time spent: 20 mins

## 2017-05-15 NOTE — ED Notes (Signed)
Date and time results received: 05/15/17 0330   Test: potassium Critical Value: 7.1 Name of Provider Notified: Dr, Derrill Kay

## 2017-05-15 NOTE — Progress Notes (Signed)
Post HD assessment. Pt tolerated tx well, net UF 1019, goal met. Pt unable to answer questions, unable to provide education at this time, no evidence of learning.

## 2017-05-15 NOTE — Progress Notes (Signed)
Pharmacy Antibiotic Note  Kimberly Montgomery is a 52 y.o. female admitted on 05/15/2017 with pneumonia.  Pharmacy has been consulted for vanc/aztreonam/levaquin dosing.  Plan: Patient has received vanc 1g IV x 2 (for a total of 2g), aztreonam 2g IV x 1, and levaquin 750 mg IV x 1  Patient has h/o ESRD on dialysis TThSa per care everywhere. Will start patient on vanc 1g IV qHD (TThSa) @ 1800 with a pre-HD 02/19  0500 prior to 3rd HD. Will continue levaquin 500 mg q48h starting 02/16 @ 1000 Will continue aztreonam 1g IV q12h   Height: 5\' 6"  (167.6 cm) Weight: 228 lb (103.4 kg) IBW/kg (Calculated) : 59.3  Temp (24hrs), Avg:101.8 F (38.8 C), Min:101.8 F (38.8 C), Max:101.8 F (38.8 C)  Recent Labs  Lab 05/15/17 0201  WBC 9.6  CREATININE 7.35*  LATICACIDVEN 1.4    Estimated Creatinine Clearance: 10.7 mL/min (A) (by C-G formula based on SCr of 7.35 mg/dL (H)).    Allergies  Allergen Reactions  . Cephalosporins Anaphylaxis    Patient has tolerated meropenem.   [name] Penicillins Anaphylaxis and Other (See Comments)    Has patient had a PCN reaction causing immediate rash, facial/tongue/throat swelling, SOB or lightheadedness with hypotension: Yes Has patient had a PCN reaction causing severe rash involving mucus membranes or skin necrosis: No Has patient had a PCN reaction that required hospitalization No Has patient had a PCN reaction occurring within the last 10 years: No If all of the above answers are "NO", then may proceed with Cephalosporin use.  . Reglan [Metoclopramide] Other (See Comments)    Severe EPS (lip, head, body tremors) March 2018  . Risperdal [Risperidone] Other (See Comments)    Severe EPS (lip, head, body tremors) March 2018  . Lamictal [Lamotrigine] Other (See Comments)    Reaction:  Hallucinations  . Phenergan [Promethazine Hcl] Nausea And Vomiting    Sensitivity to medicine  . Pravastatin Other (See Comments)    Reaction:  Muscle pain   . Sulfa Antibiotics  Other (See Comments)    Reaction:  Unknown     Thank you for allowing pharmacy to be a part of this patient's care.  Thomasene Ripple, PharmD, BCPS Clinical Pharmacist 05/15/2017

## 2017-05-15 NOTE — Progress Notes (Signed)
Pre HD assessment. Tx delayed r/t obtaining consent to dialyze pt.

## 2017-05-15 NOTE — Progress Notes (Signed)
Pre HD assessment  

## 2017-05-15 NOTE — ED Triage Notes (Signed)
Pt arrived via EMS from [facility] with complaints of altered mental status according to facility. EMS reported that she missed dialysis today, Pt had Nausea and vomiting, and a cough. VS per EMS O2 sat- 78% BP-148/64 Temp- 102.1 axillary resp-30s Pt has a 22 in back of Left forearm and a fistula in right bicep. Pt is non-verbal and communicating with [place] through a few grunts and moans.

## 2017-05-15 NOTE — Progress Notes (Signed)
Talked to Dr. Sherryll Burger about patient's BP of 174/49, held am dose of amlodipine this morning, will give Amlodipine and Hydralazine IV PRN to give for SBP greater than 180. Also talked about patient's episode of abdominal pain, nausea and vomiting, per MD will order some imaging. Gave Zofran for N&V. No other concern at the moment. RN will continue to monitor.

## 2017-05-15 NOTE — ED Provider Notes (Signed)
[facility] ([facility]) Emergency Department Provider Note   ____________________________________________   I have reviewed the triage vital signs and the nursing notes.   HISTORY  Chief Complaint Altered Mental Status   History limited by and level 5 caveat due to: AMS   HPI Kimberly Montgomery is a 52 y.o. female who presents to the emergency department today via EMS because of AMS. Patient currently a resident at [facility]. Apparently the patient had been feeling bad throughout the day with nausea and vomiting. She does have a history of ESRD and did not go to dialysis today because of the way she felt. When they checked on her tonight she was found to be altered. Additionally she was found to be hypoxic and was placed on four liters o2. The patient cannot give any history.   Per medical record review patient has a history of ESRD, MI, DM, CHF.   Past Medical History:  Diagnosis Date  . Anginal pain (HCC)   . Anxiety   . Bipolar disorder (HCC)   . CAD (coronary artery disease)   . CHF (congestive heart failure) (HCC)   . Chronic lower back pain   . Depression   . Endometriosis   . ESRD (end stage renal disease) on dialysis (HCC)    "[facility]; [address]; [place]; TTS" (01/19/2016)  . Gastroparesis   . GERD (gastroesophageal reflux disease)   . History of blood transfusion "several"   "my blood would get low; low RBC"  . History of hiatal hernia   . HLD (hyperlipidemia)   . Hypertension   . Migraine    "monthly" (01/19/2016)  . Myocardial infarction (HCC) 2017   "~ 3 wks ago" (01/19/2016)  . Renal disorder   . Renal insufficiency   . Seizures (HCC) 07/2015   "I've only had the 1; don't know what from" (01/19/2016)  . Type II diabetes mellitus [facility])     Patient Active Problem List   Diagnosis Date Noted  . Benign essential tremor 01/30/2017  . Seizure (HCC)   . Acute encephalopathy   . Tremor   . Cerebrovascular disease   . Complication from  renal dialysis device 04/08/2016  . Colitis 03/13/2016  . Chronic diastolic congestive heart failure (HCC) 01/22/2016  . Pressure injury of skin 01/20/2016  . Bacteremia, coagulase-negative staphylococcal 12/27/2015  . Essential hypertension 11/21/2015  . Anemia of chronic disease 11/21/2015  . Acute respiratory failure with hypoxia (HCC) 11/21/2015  . ESRD on dialysis (HCC) 11/21/2015  . 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 NSTEMI (non-ST elevated myocardial infarction) (HCC) 11/20/2015  . Chest pain, rule out acute myocardial infarction 11/18/2015  . Bipolar I disorder, most recent episode depressed (HCC)   . Altered mental status 08/24/2015  . Ileus (HCC)   . Bipolar I disorder (HCC) 07/25/2015  . Seizures (HCC) 07/25/2015  . Peritonitis (HCC) 07/16/2015  . Unstable angina (HCC) 07/09/2015  . Accelerated hypertension 07/09/2015  . Type 2 diabetes mellitus (HCC) 07/09/2015  . CAD (coronary artery disease) 07/09/2015  . HLD (hyperlipidemia) 07/09/2015  . GERD (gastroesophageal reflux disease) 07/09/2015    Past Surgical History:  Procedure Laterality Date  . ABDOMINAL HYSTERECTOMY     "partial"  . AV FISTULA PLACEMENT Right 08/23/2016   Procedure: ARTERIOVENOUS (AV) FISTULA CREATION  ( BRACHIAL CEPHALIC );  Surgeon: Renford Dills, MD;  Location: [facility] ORS;  Service: Vascular;  Laterality: Right;  . BELOW KNEE LEG AMPUTATION Right 2010?  [name] CARDIAC CATHETERIZATION Right 07/10/2015   Procedure:  Left Heart Cath and Coronary Angiography;  Surgeon: Laurier Nancy, MD;  Location: [facility] INVASIVE CV LAB;  Service: Cardiovascular;  Laterality: Right;  . CARDIAC CATHETERIZATION Right 09/14/2015   Procedure: Left Heart Cath and Coronary Angiography;  Surgeon: Laurier Nancy, MD;  Location: [facility] INVASIVE CV LAB;  Service: Cardiovascular;  Laterality: Right;  . CARDIAC CATHETERIZATION N/A 09/14/2015   Procedure: Coronary Stent Intervention;  Surgeon: Alwyn Pea, MD;  Location: [facility] INVASIVE CV  LAB;  Service: Cardiovascular;  Laterality: N/A;  . CARDIAC CATHETERIZATION Right 11/20/2015   Procedure: Left Heart Cath and Coronary Angiography;  Surgeon: Laurier Nancy, MD;  Location: [facility] INVASIVE CV LAB;  Service: Cardiovascular;  Laterality: Right;  . CATARACT EXTRACTION, BILATERAL    . CORONARY ANGIOPLASTY WITH STENT PLACEMENT  <2017   @ [facility]/notes 07/03/2013  . CORONARY ANGIOPLASTY WITH STENT PLACEMENT    . DIALYSIS/PERMA CATHETER REMOVAL N/A 11/12/2016   Procedure: DIALYSIS/PERMA CATHETER REMOVAL;  Surgeon: Renford Dills, MD;  Location: [facility] INVASIVE CV LAB;  Service: Cardiovascular;  Laterality: N/A;  . HYSTEROTOMY    . PERIPHERAL VASCULAR CATHETERIZATION N/A 08/30/2015   Procedure: Dialysis/Perma Catheter Insertion;  Surgeon: Annice Needy, MD;  Location: [facility] INVASIVE CV LAB;  Service: Cardiovascular;  Laterality: N/A;  . PERIPHERAL VASCULAR CATHETERIZATION N/A 01/01/2016   Procedure: Dialysis/Perma Catheter Insertion;  Surgeon: Annice Needy, MD;  Location: [facility] INVASIVE CV LAB;  Service: Cardiovascular;  Laterality: N/A;  . PERITONEAL CATHETER INSERTION  11/03/2013   [name] 11/03/2013  . PERITONEAL CATHETER REMOVAL  01/01/2016   "took the one from May out; put new PD cath in" (01/19/2016)  . PERITONEAL CATHETER REMOVAL  11/03/2013; 02/11/2014   [name] 11/03/2013; Removal of tunneled catheter/notes 02/11/2014  . SALPINGOOPHORECTOMY Right    [name] 07/03/2013  . TEE WITHOUT CARDIOVERSION N/A 06/07/2016   Procedure: TRANSESOPHAGEAL ECHOCARDIOGRAM (TEE);  Surgeon: Chrystie Nose, MD;  Location: [facility] ENDOSCOPY;  Service: Cardiovascular;  Laterality: N/A;  . TUBAL LIGATION      Prior to Admission medications   Medication Sig Start Date End Date Taking? Authorizing Provider  Amino Acids-Protein Hydrolys (FEEDING SUPPLEMENT, PRO-STAT SUGAR FREE 64,) LIQD Take 30 mLs by mouth 3 (three) times daily.     [provider]  amLODipine (NORVASC) 10 MG tablet Take 10 mg by mouth daily.      [provider]  aspirin 81 MG chewable tablet Chew 81 mg by mouth daily.    [provider]  atorvastatin (LIPITOR) 40 MG tablet Take 40 mg by mouth daily at 6 PM.    [provider]  benztropine (COGENTIN) 0.5 MG tablet Take 0.5 mg by mouth 2 (two) times daily.    [provider]  bisacodyl (DULCOLAX) 10 MG suppository Place 10 mg rectally as needed for moderate constipation.    [provider]  carboxymethylcellulose (REFRESH) 1 % ophthalmic solution Place 1 drop into both eyes 3 (three) times daily.    [provider]  clonazePAM (KLONOPIN) 0.5 MG tablet Take 0.5 mg by mouth at bedtime.    [provider]  clopidogrel (PLAVIX) 75 MG tablet Take 75 mg by mouth daily at 12 noon.     [provider]  fluticasone (FLONASE) 50 MCG/ACT nasal spray Place 1 spray into both nostrils daily.     [provider]  gabapentin (NEURONTIN) 300 MG capsule Take 300 mg by mouth daily.     [provider]  hydrALAZINE (APRESOLINE) 50 MG tablet Take 1  tablet (50 mg total) by mouth 3 (three) times daily. Patient not taking: Reported on 04/03/2017 06/09/16   Leroy Sea, MD  HYDROcodone-acetaminophen (NORCO) 5-325 MG tablet Take 1-2 tablets by mouth every 6 (six) hours as needed for moderate pain or severe pain. 08/23/16   Schnier, Latina Craver, MD  insulin aspart (NOVOLOG) 100 UNIT/ML injection Inject 8 Units into the skin 3 (three) times daily with meals.     [provider]  insulin detemir (LEVEMIR) 100 UNIT/ML injection Inject 9 Units into the skin at bedtime.     [provider]  isosorbide mononitrate (IMDUR) 30 MG 24 hr tablet Take 90 mg by mouth daily at 12 noon.     [provider]  lactulose (CHRONULAC) 10 GM/15ML solution Take 10 g by mouth daily.     [provider]  levETIRAcetam (KEPPRA) 750 MG tablet Take 1,500 mg by mouth daily at 12 noon.    [provider]  lidocaine  (LIDODERM) 5 % Place 1 patch onto the skin at bedtime.     [provider]  lidocaine (LMX) 4 % cream Apply 1 application topically daily as needed (pain). To right arm fistula    [provider]  Melatonin 3 MG TABS Take 3 mg by mouth at bedtime.    [provider]  metoprolol (LOPRESSOR) 50 MG tablet Take 1 tablet (50 mg total) by mouth 2 (two) times daily. 09/15/15   Milagros Loll, MD  nitroGLYCERIN (NITROSTAT) 0.4 MG SL tablet Place 0.4 mg under the tongue every 5 (five) minutes as needed for chest pain. Reported on 07/10/2015    [provider]  omeprazole (PRILOSEC) 20 MG capsule Take 20 mg by mouth 2 (two) times daily.     [provider]  ondansetron (ZOFRAN ODT) 4 MG disintegrating tablet Take 1 tablet (4 mg total) by mouth every 6 (six) hours as needed for nausea or vomiting. Patient taking differently: Take 4 mg by mouth every 8 (eight) hours as needed for nausea or vomiting.  07/16/15   Sharyn Creamer, MD  OXcarbazepine (TRILEPTAL) 150 MG tablet Take 150 mg by mouth at bedtime.    [provider]  oxyCODONE-acetaminophen (PERCOCET/ROXICET) 5-325 MG tablet Take 1 tablet by mouth every 12 (twelve) hours as needed for severe pain. Do not exceed 4gm of Tylenol in 24 hours Patient not taking: Reported on 01/07/2017 06/09/16   Leroy Sea, MD  polyethylene glycol (MIRALAX / GLYCOLAX) packet Take 17 g by mouth daily. Patient taking differently: Take 17 g by mouth daily at 12 noon.  08/02/15   Adrian Saran, MD  senna (SENOKOT) 8.6 MG TABS tablet Take 2 tablets by mouth 2 (two) times daily.     [provider]  sevelamer carbonate (RENVELA) 800 MG tablet Take 800 mg by mouth See admin instructions. Take 1 tablet four times a day. (with each meal and with bedtime snack.)    [provider]  torsemide (DEMADEX) 100 MG tablet Take 100 mg by mouth daily.     [provider]    Allergies Cephalosporins; Penicillins; Reglan  [metoclopramide]; Risperdal [risperidone]; Lamictal [lamotrigine]; Phenergan [promethazine hcl]; Pravastatin; and Sulfa antibiotics  Family History  Problem Relation Age of Onset  . CAD Unknown   . Diabetes Unknown   . Bipolar disorder Unknown   . Cervical cancer Mother   . Other Father        heart bypass  . Breast cancer Neg Hx  Social History Social History   Tobacco Use  . Smoking status: Former Smoker    Packs/day: 0.75    Types: Cigarettes    Last attempt to quit: 07/01/2015    Years since quitting: 1.8  . Smokeless tobacco: Never Used  Substance Use Topics  . Alcohol use: No    Alcohol/week: 0.0 oz  . Drug use: No    Review of Systems Unable to obtain secondary to mental status.   ____________________________________________   PHYSICAL EXAM:  VITAL SIGNS: ED Triage Vitals  Enc Vitals Group     BP 05/15/17 0152 137/64     Pulse Rate 05/15/17 0152 82     Resp 05/15/17 0155 (!) 22     Temp 05/15/17 0152 (!) 101.8 F (38.8 C)     Temp Source 05/15/17 0152 Oral     SpO2 05/15/17 0152 100 %     Weight 05/15/17 0156 228 lb (103.4 kg)     Height 05/15/17 0156 5\' 6"  (1.676 m)   Constitutional: Awake, intermittently alert. Non verbal.  Eyes: Conjunctivae are normal.  ENT   Head: Normocephalic and atraumatic.   Nose: No congestion/rhinnorhea.   Mouth/Throat: Mucous membranes are moist.   Neck: No stridor. Hematological/Lymphatic/Immunilogical: No cervical lymphadenopathy. Cardiovascular: Normal rate, regular rhythm.  No murmurs, rubs, or gallops.  Respiratory: Normal respiratory effort without tachypnea nor retractions. Breath sounds are clear and equal bilaterally. No wheezes/rales/rhonchi. Gastrointestinal: Soft and non tender. No rebound. No guarding.  Genitourinary: Deferred Musculoskeletal: s/p bka right leg. Neurologic:  Awake, intermittently alert. Appears to move all extremities.  Skin:  Skin is warm, dry and intact. No rash  noted.  ____________________________________________    LABS (pertinent positives/negatives)  Influenza a positive CBC wbc 9.6, hgb 10.5, plt 228 CMP k 7.1, cr 7.35 Lactic 1.4 UA 6-30 wbc, too numerous to count rbc  ____________________________________________   EKG  I, Phineas Semen, attending physician, personally viewed and interpreted this EKG  EKG Time: 0157 Rate: 82 Rhythm: sinus rhythm Axis: normal axis Intervals: qtc 450 QRS: narrow ST changes: no st elevation, peaked t waves v4, v5 Impression: abnormal ekg   ____________________________________________    RADIOLOGY  CXR Concerning for pneumonia   ____________________________________________   PROCEDURES  Procedures  CRITICAL CARE Performed by: Phineas Semen   Total critical care time: 45 minutes  Critical care time was exclusive of separately billable procedures and treating other patients.  Critical care was necessary to treat or prevent imminent or life-threatening deterioration.  Critical care was time spent personally by me on the following activities: development of treatment plan with patient and/or surrogate as well as nursing, discussions with consultants, evaluation of patient's response to treatment, examination of patient, obtaining history from patient or surrogate, ordering and performing treatments and interventions, ordering and review of laboratory studies, ordering and review of radiographic studies, pulse oximetry and re-evaluation of patient's condition.  ____________________________________________   INITIAL IMPRESSION / ASSESSMENT AND PLAN / ED COURSE  Pertinent labs & imaging results that were available during my care of the patient were reviewed by me and considered in my medical decision making (see chart for details).  Patient presented to the emergency department today from [facility] because of concerns for altered mental status.  On exam patient is  minimally responsive and nonverbal.  Patient was febrile as well as hypoxic.  Differential would certainly be concerning for sepsis possibly due to pneumonia, influenza, urinary tract infection.  Would also consider other etiologies of altered mental status  like head bleed and toxicities as well as polypharmacy however given vital signs would have most concern for infection.  Workup was positive for influenza, pneumonia, urinary tract infection.  Additionally patient had missed her dialysis appointment and potassium did come back elevated at 7.1.  EKG did show some peaked T waves.  Patient was given medication to help bring down her potassium.  Additionally I talked to Dr. Cherylann Ratel with nephrology to start helping arrange dialysis.  Patient was given multiple broad-spectrum antibiotics.  Patient will be admitted to the hospitalist service.   ____________________________________________   FINAL CLINICAL IMPRESSION(S) / ED DIAGNOSES  Final diagnoses:  Altered mental status, unspecified altered mental status type  Lower urinary tract infectious disease  Hyperkalemia  ESRD (end stage renal disease) on dialysis (HCC)  Pneumonia due to infectious organism, unspecified laterality, unspecified part of lung  Influenza     Note: This dictation was prepared with Dragon dictation. Any transcriptional errors that result from this process are unintentional     Phineas Semen, MD 05/15/17 [phone]

## 2017-05-15 NOTE — Progress Notes (Signed)
Patient unable to tolerate finishing the contrast, called CT and updated them. Will get transport and get imaging done.

## 2017-05-15 NOTE — Care Management (Signed)
patient from [facility].  Chronic HD.  Notified  Dimas Chyle with [facility].  Presented with altered mental status and  potassium of 7.1.

## 2017-05-16 LAB — CBC
HEMATOCRIT: 28.1 % — AB (ref 35.0–47.0)
Hemoglobin: 9.1 g/dL — ABNORMAL LOW (ref 12.0–16.0)
MCH: 30 pg (ref 26.0–34.0)
MCHC: 32.5 g/dL (ref 32.0–36.0)
MCV: 92.4 fL (ref 80.0–100.0)
Platelets: 198 10*3/uL (ref 150–440)
RBC: 3.04 MIL/uL — ABNORMAL LOW (ref 3.80–5.20)
RDW: 14.5 % (ref 11.5–14.5)
WBC: 4 10*3/uL (ref 3.6–11.0)

## 2017-05-16 LAB — BASIC METABOLIC PANEL
ANION GAP: 12 (ref 5–15)
BUN: 36 mg/dL — AB (ref 6–20)
CALCIUM: 8.6 mg/dL — AB (ref 8.9–10.3)
CO2: 30 mmol/L (ref 22–32)
Chloride: 92 mmol/L — ABNORMAL LOW (ref 101–111)
Creatinine, Ser: 5.28 mg/dL — ABNORMAL HIGH (ref 0.44–1.00)
GFR calc Af Amer: 10 mL/min — ABNORMAL LOW (ref >60)
GFR calc non Af Amer: 8 mL/min — ABNORMAL LOW (ref >60)
GLUCOSE: 132 mg/dL — AB (ref 65–99)
POTASSIUM: 4.7 mmol/L (ref 3.5–5.1)
Sodium: 134 mmol/L — ABNORMAL LOW (ref 135–145)

## 2017-05-16 MED ORDER — HYDROCODONE-ACETAMINOPHEN 5-325 MG PO TABS
1.0000 | ORAL_TABLET | Freq: Four times a day (QID) | ORAL | Status: DC | PRN
  Administered 2017-05-16 (×2): 2 via ORAL
  Administered 2017-05-16: 1 via ORAL
  Administered 2017-05-17 – 2017-05-18 (×3): 2 via ORAL
  Administered 2017-05-18 – 2017-05-19 (×2): 1 via ORAL
  Filled 2017-05-16 (×2): qty 2
  Filled 2017-05-16: qty 1
  Filled 2017-05-16 (×2): qty 2
  Filled 2017-05-16: qty 1
  Filled 2017-05-16: qty 2

## 2017-05-16 NOTE — Plan of Care (Signed)
  Progressing Clinical Measurements: Diagnostic test results will improve 05/16/2017 0326 - Progressing by Stefan Church, RN Pain Managment: General experience of comfort will improve 05/16/2017 0326 - Progressing by Stefan Church, RN

## 2017-05-16 NOTE — Care Management Important Message (Signed)
Important Message  Patient Details  Name: Kimberly Montgomery MRN: [id number] Date of Birth: 1965/10/31   Medicare Important Message Given:  Yes No notice in Epic. Notice signed, one given to patient and the other to HIM for scanning    Eber Hong, RN 05/16/2017, 3:42 PM

## 2017-05-16 NOTE — Progress Notes (Signed)
[facility]  ROUNDING NOTE   Subjective:  Patient still quite confused. However she was eating earlier today. She will be due for dialysis again tomorrow.   Objective:  Vital signs in last 24 hours:  Temp:  [98.4 F (36.9 C)-99.6 F (37.6 C)] 99.1 F (37.3 C) (02/15 0931) Pulse Rate:  [72-86] 83 (02/15 0931) Resp:  [16-20] 16 (02/15 0931) BP: (111-168)/(39-78) 146/55 (02/15 1220) SpO2:  [92 %-100 %] 92 % (02/15 0931)  Weight change: 3.28 kg (7 lb 3.7 oz) Filed Weights   05/15/17 0535 05/15/17 0730 05/15/17 1152  Weight: 116 kg (255 lb 12.8 oz) 106.7 kg (235 lb 3.7 oz) 106 kg (233 lb 11 oz)    Intake/Output: I/O last 3 completed shifts: In: 450 [IV Piggyback:450] Out: 1019 [Other:1019]   Intake/Output this shift:  No intake/output data recorded.  Physical Exam: General: No acute distress  Head: Normocephalic, atraumatic. Moist oral mucosal membranes  Eyes: Anicteric  Neck: Supple, trachea midline  Lungs:  Clear to auscultation, normal effort  Heart: S1S2 no rubs  Abdomen:  Soft, nontender, bowel sounds present  Extremities: Trace peripheral edema.  Neurologic: Awake but confused  Skin: No lesions       Basic Metabolic Panel: Recent Labs  Lab 05/15/17 0201 05/15/17 0624 05/16/17 0715  NA 133* 132* 134*  K 7.1* 6.7* 4.7  CL 93* 94* 92*  CO2 25 24 30   GLUCOSE 165* 135* 132*  BUN 65* 67* 36*  CREATININE 7.35* 7.64* 5.28*  CALCIUM 8.8* 8.9 8.6*  PHOS  --  5.1*  --     Liver Function Tests: Recent Labs  Lab 05/15/17 0201  AST 34  ALT 13*  ALKPHOS 157*  BILITOT 0.8  PROT 7.4  ALBUMIN 3.8   No results for input(s): LIPASE, AMYLASE in the last 168 hours. No results for input(s): AMMONIA in the last 168 hours.  CBC: Recent Labs  Lab 05/15/17 0201 05/15/17 0624 05/16/17 0715  WBC 9.6 6.9 4.0  NEUTROABS 8.9*  --   --   HGB 10.5* 10.0* 9.1*  HCT 32.1* 30.2* 28.1*  MCV 92.0 91.5 92.4  PLT 228 200 198    Cardiac Enzymes: No  results for input(s): CKTOTAL, CKMB, CKMBINDEX, TROPONINI in the last 168 hours.  BNP: Invalid input(s): POCBNP  CBG: Recent Labs  Lab 05/15/17 0644  GLUCAP 122*    Microbiology: Results for orders placed or performed during the hospital encounter of 05/15/17  Blood Culture (routine x 2)     Status: None (Preliminary result)   Collection Time: 05/15/17  2:01 AM  Result Value Ref Range Status   Specimen Description BLOOD LEFT HAND  Final   Special Requests   Final    BOTTLES DRAWN AEROBIC AND ANAEROBIC Blood Culture adequate volume   Culture   Final    NO GROWTH 1 DAY Performed at [facility], [address], [place], [place] [address]    Report Status PENDING  Incomplete  Blood Culture (routine x 2)     Status: None (Preliminary result)   Collection Time: 05/15/17  2:01 AM  Result Value Ref Range Status   Specimen Description BLOOD RIGHT FA  Final   Special Requests   Final    BOTTLES DRAWN AEROBIC AND ANAEROBIC Blood Culture adequate volume   Culture   Final    NO GROWTH 1 DAY Performed at [facility], [address] [place], [place] [address]    Report Status PENDING  Incomplete  MRSA PCR Screening     Status: Abnormal   Collection Time: 05/15/17  5:35 AM  Result Value Ref Range Status   MRSA by PCR POSITIVE (A) NEGATIVE Final    Comment:        The GeneXpert MRSA Assay (FDA approved for NASAL specimens only), is one component of a comprehensive MRSA colonization surveillance program. It is not intended to diagnose MRSA infection nor to guide or monitor treatment for MRSA infections. RESULT CALLED TO, READ BACK BY AND VERIFIED WITH: AMY DALTON AT 0802 ON 05/15/17 [facility]. Performed at [facility], [address] [place], [place] [address]     Coagulation Studies: No results for input(s): LABPROT, INR in the last 72 hours.  Urinalysis: Recent Labs    05/14/17 1530 05/15/17 0200  COLORURINE YELLOW* YELLOW*  LABSPEC 1.012  1.017  PHURINE 8.0 6.0  GLUCOSEU NEGATIVE 50*  HGBUR SMALL* MODERATE*  BILIRUBINUR NEGATIVE NEGATIVE  KETONESUR NEGATIVE NEGATIVE  PROTEINUR 100* 100*  NITRITE NEGATIVE NEGATIVE  LEUKOCYTESUR LARGE* NEGATIVE      Imaging: Ct Abdomen Pelvis W Contrast  Result Date: 05/15/2017 CLINICAL DATA:  Nausea, vomiting, abdominal pain, gastroenteritis or colitis suspected, history bipolar disorder, coronary artery disease, CHF, end-renal disease on dialysis, GERD, seizures, type II diabetes mellitus, former smoker EXAM: CT ABDOMEN AND PELVIS WITH CONTRAST TECHNIQUE: Multidetector CT imaging of the abdomen and pelvis was performed using the standard protocol following bolus administration of intravenous contrast. Sagittal and coronal MPR images reconstructed from axial data set. CONTRAST:  ISOVUE-300 IOPAMIDOL (ISOVUE-300) INJECTION 61% IV. Probable small amount of dilute oral contrast. COMPARISON:  03/28/2017 FINDINGS: Lower chest: Small BILATERAL pleural effusions. Compressive atelectasis RIGHT lower lobe. Hepatobiliary: Partially contracted gallbladder, unable to exclude wall thickening. Dependent density in the gallbladder likely reflects tiny stones. Liver unremarkable. Pancreas: F atrophic pancreas Spleen: Normal appearance Adrenals/Urinary Tract: Atrophic kidneys. Renovascular calcifications and questionable tiny nonobstructing RIGHT renal calculus. No definite renal mass, hydronephrosis or hydroureter. Bladder decompressed, wall appearing thick but this could represent an artifact from underdistention. Stomach/Bowel: Normal appendix, retrocecal. Large and small bowel loops otherwise normal appearance. Small hiatal hernia. Vascular/Lymphatic: Atherosclerotic calcifications of aorta and coronary arteries. Aorta normal caliber. No adenopathy. Reproductive: Uterus and adnexa unremarkable Other: Small amount of free pelvic fluid. No free air, hernia, or acute inflammatory process. Musculoskeletal:  Unremarkable IMPRESSION: Contracted gallbladder with questionable mild wall thickening Small amount of nonspecific free pelvic fluid. Tiny BILATERAL pleural effusions and mild compressive atelectasis of RIGHT lower lobe. Aortic Atherosclerosis (ICD10-I70.0). Electronically Signed   By: Ulyses Southward M.D.   On: 05/15/2017 16:40   Dg Chest Port 1 View  Result Date: 05/15/2017 CLINICAL DATA:  Altered mental status with fever and hypoxia. EXAM: PORTABLE CHEST 1 VIEW COMPARISON:  03/28/2017 FINDINGS: Stable cardiomegaly with aortic atherosclerosis. New pulmonary consolidation in the right upper lobe compatible pneumonia. Mild central vascular congestion is noted. No effusion or pneumothorax. No acute osseous abnormality. IMPRESSION: Cardiomegaly with aortic atherosclerosis. Pulmonary consolidation in the right upper lobe consistent with pneumonia. Electronically Signed   By: Tollie Eth M.D.   On: 05/15/2017 02:26     Medications:   . sodium chloride    . sodium chloride    . sodium chloride    . aztreonam Stopped (05/16/17 1037)  . [START ON 05/17/2017] levofloxacin (LEVAQUIN) IV    . vancomycin    . vancomycin Stopped (05/15/17 1843)   . amLODipine  10 mg Oral Daily  . aspirin  81 mg  Oral Daily  . atorvastatin  40 mg Oral q1800  . benztropine  0.5 mg Oral BID  . chlorhexidine  15 mL Mouth Rinse BID  . Chlorhexidine Gluconate Cloth  6 each Topical Q0600  . clonazePAM  0.5 mg Oral QHS  . clopidogrel  75 mg Oral Q1200  . feeding supplement (NEPRO CARB STEADY)  237 mL Oral BID BM  . feeding supplement (PRO-STAT SUGAR FREE 64)  30 mL Oral TID  . fluticasone  1 spray Each Nare Daily  . heparin  5,000 Units Subcutaneous Q8H  . isosorbide mononitrate  90 mg Oral Q1200  . lactulose  10 g Oral Daily  . levETIRAcetam  1,500 mg Oral Q1200  . mouth rinse  15 mL Mouth Rinse q12n4p  . Melatonin  5 mg Oral QHS  . metoprolol tartrate  50 mg Oral BID  . multivitamin  1 tablet Oral QHS  . mupirocin  ointment  1 application Nasal BID  . oseltamivir  30 mg Oral Once  . oseltamivir  30 mg Oral Q T,Th,Sat-1800  . OXcarbazepine  150 mg Oral QHS  . pantoprazole  40 mg Oral Daily  . polyvinyl alcohol  1 drop Both Eyes TID  . senna  2 tablet Oral BID  . sevelamer carbonate  800 mg Oral TID WC  . sodium chloride flush  3 mL Intravenous Q12H  . torsemide  100 mg Oral Daily   sodium chloride, sodium chloride, sodium chloride, acetaminophen **OR** acetaminophen, albuterol, alteplase, bisacodyl, heparin, hydrALAZINE, HYDROcodone-acetaminophen, lidocaine (PF), lidocaine-prilocaine, nitroGLYCERIN, ondansetron **OR** ondansetron (ZOFRAN) IV, pentafluoroprop-tetrafluoroeth, sodium chloride flush  Assessment/ Plan:  52 y.o. female with end-stage renal disease on hemodialysis, asthma, gastroparesis, diabetes mellitus type II, congestive heart failure, hypertension, endometriosis, coronary atherosclerosis, drug-eluting stent to LAD 2017  [facility]rology/ [address]/ TTS   1.  ESRD on HD TTS. Patient due for dialysis tomorrow.  We will per orders.  2.  Hyperkalemia.  Phosphorus down to 4.7 today.  Continue to monitor serum potassium.  3.  Secondary hyperparathyroidism.  Followup serum phosphorus during dialysis tomorrow.  4.  Anemia of chronic kidney disease.  Consider starting back Epogentomorrow with dialysis.    LOS: 1 Donnamae Muilenburg 2/15/20195:00 PM

## 2017-05-16 NOTE — Progress Notes (Signed)
[facility] [place] [facility]   PATIENT NAME: Kimberly Montgomery    MR#:  [id number]  DATE OF BIRTH:  1966-01-09  SUBJECTIVE:  CHIEF COMPLAINT:   Chief Complaint  Patient presents with  . Altered Mental Status  confused, had abd pain, N/V y'day but slowly improving. Some jerks, not feeling well REVIEW OF SYSTEMS:  Review of Systems  Unable to perform ROS: Mental status change    DRUG ALLERGIES:   Allergies  Allergen Reactions  . Cephalosporins Anaphylaxis    Patient has tolerated meropenem.   [name] Penicillins Anaphylaxis and Other (See Comments)    Has patient had a PCN reaction causing immediate rash, facial/tongue/throat swelling, SOB or lightheadedness with hypotension: Yes Has patient had a PCN reaction causing severe rash involving mucus membranes or skin necrosis: No Has patient had a PCN reaction that required hospitalization No Has patient had a PCN reaction occurring within the last 10 years: No If all of the above answers are "NO", then may proceed with Cephalosporin use.  . Reglan [Metoclopramide] Other (See Comments)    Severe EPS (lip, head, body tremors) March 2018  . Risperdal [Risperidone] Other (See Comments)    Severe EPS (lip, head, body tremors) March 2018  . Lamictal [Lamotrigine] Other (See Comments)    Reaction:  Hallucinations  . Phenergan [Promethazine Hcl] Nausea And Vomiting    Sensitivity to medicine  . Pravastatin Other (See Comments)    Reaction:  Muscle pain   . Sulfa Antibiotics Other (See Comments)    Reaction:  Unknown    VITALS:  Blood pressure 127/72, pulse 74, temperature 98.7 F (37.1 C), temperature source Oral, resp. rate 16, height 5\' 6"  (1.676 m), weight 106 kg (233 lb 11 oz), last menstrual period 03/16/2015, SpO2 96 %. PHYSICAL EXAMINATION:  Physical Exam  Constitutional: She is well-developed, well-nourished, and in no distress.  HENT:  Head: Normocephalic and atraumatic.  Eyes: Conjunctivae and EOM are  normal. Pupils are equal, round, and reactive to light.  Neck: Normal range of motion. Neck supple. No tracheal deviation present. No thyromegaly present.  Cardiovascular: Normal rate, regular rhythm and normal heart sounds.  Pulmonary/Chest: Effort normal and breath sounds normal. No respiratory distress. She has no wheezes. She exhibits no tenderness.  Abdominal: Soft. Bowel sounds are normal. She exhibits no distension. There is no tenderness.  Musculoskeletal: Normal range of motion.  Neurological: She is alert. She is disoriented. No cranial nerve deficit.  Pleasantly confused  Skin: Skin is warm and dry. No rash noted.  Psychiatric: Mood and affect normal.   LABORATORY PANEL:  Female CBC Recent Labs  Lab 05/16/17 0715  WBC 4.0  HGB 9.1*  HCT 28.1*  PLT 198   ------------------------------------------------------------------------------------------------------------------ Chemistries  Recent Labs  Lab 05/15/17 0201  05/16/17 0715  NA 133*   < > 134*  K 7.1*   < > 4.7  CL 93*   < > 92*  CO2 25   < > 30  GLUCOSE 165*   < > 132*  BUN 65*   < > 36*  CREATININE 7.35*   < > 5.28*  CALCIUM 8.8*   < > 8.6*  AST 34  --   --   ALT 13*  --   --   ALKPHOS 157*  --   --   BILITOT 0.8  --   --    < > = values in this interval not displayed.   RADIOLOGY:  No results found.  ASSESSMENT AND PLAN:  52 year old female patient with history of end-stage renal disease on dialysis, hyperlipidemia, hypertension, coronary disease, congestive heart failure admitted for lethargy and confusion.  1.Healthcare associated pneumonia: continue IV vanco + Levaquin 2.Flu like syndrome: symptomatic mgmt, tamilfu 3.Hyperkalemia: corrected with HD today 4.End-stage renal disease on dialysis: another session tomorrow per nephro 5.  Abd pain, N/V:  CT A-P showing no acute patho 6. Confusion: likely due to uremia, if persistent, consider CT head       All the records are reviewed and  case discussed with Care Management/Social Worker. Management plans discussed with the patient, nephro and they are in agreement.  CODE STATUS: Full Code  TOTAL TIME TAKING CARE OF THIS PATIENT: 35 minutes.   More than 50% of the time was spent in counseling/coordination of care: YES  POSSIBLE D/C IN 1-2 DAYS, DEPENDING ON CLINICAL CONDITION.   Delfino Lovett M.D on 05/16/2017 at 8:03 PM  Between 7am to 6pm - Pager - [phone]  After 6pm go to [email or web address] - Social research officer, government  [facility]  Office  [phone]  CC: Primary care physician; Dorothey Baseman, MD  Note: This dictation was prepared with Dragon dictation along with smaller phrase technology. Any transcriptional errors that result from this process are unintentional.

## 2017-05-16 NOTE — Progress Notes (Signed)
Notified MD of pt stating, " I hurt all over". Orders placed. Will continue to monitor and assess.

## 2017-05-17 LAB — BASIC METABOLIC PANEL
ANION GAP: 15 (ref 5–15)
BUN: 58 mg/dL — ABNORMAL HIGH (ref 6–20)
CALCIUM: 7.9 mg/dL — AB (ref 8.9–10.3)
CO2: 27 mmol/L (ref 22–32)
Chloride: 92 mmol/L — ABNORMAL LOW (ref 101–111)
Creatinine, Ser: 6.92 mg/dL — ABNORMAL HIGH (ref 0.44–1.00)
GFR, EST AFRICAN AMERICAN: 7 mL/min — AB (ref >60)
GFR, EST NON AFRICAN AMERICAN: 6 mL/min — AB (ref >60)
Glucose, Bld: 127 mg/dL — ABNORMAL HIGH (ref 65–99)
POTASSIUM: 4.8 mmol/L (ref 3.5–5.1)
SODIUM: 134 mmol/L — AB (ref 135–145)

## 2017-05-17 LAB — CBC
HCT: 26.5 % — ABNORMAL LOW (ref 35.0–47.0)
Hemoglobin: 8.9 g/dL — ABNORMAL LOW (ref 12.0–16.0)
MCH: 30.4 pg (ref 26.0–34.0)
MCHC: 33.6 g/dL (ref 32.0–36.0)
MCV: 90.5 fL (ref 80.0–100.0)
Platelets: 202 10*3/uL (ref 150–440)
RBC: 2.93 MIL/uL — AB (ref 3.80–5.20)
RDW: 14.4 % (ref 11.5–14.5)
WBC: 4.9 10*3/uL (ref 3.6–11.0)

## 2017-05-17 LAB — URINE CULTURE

## 2017-05-17 LAB — GLUCOSE, CAPILLARY: GLUCOSE-CAPILLARY: 88 mg/dL (ref 65–99)

## 2017-05-17 MED ORDER — GABAPENTIN 300 MG PO CAPS
300.0000 mg | ORAL_CAPSULE | Freq: Once | ORAL | Status: AC
  Administered 2017-05-17: 300 mg via ORAL
  Filled 2017-05-17: qty 1

## 2017-05-17 MED ORDER — METHYLPREDNISOLONE SODIUM SUCC 125 MG IJ SOLR
60.0000 mg | INTRAMUSCULAR | Status: DC
  Administered 2017-05-17 – 2017-05-20 (×3): 60 mg via INTRAVENOUS
  Filled 2017-05-17 (×3): qty 2

## 2017-05-17 MED ORDER — EPOETIN ALFA 10000 UNIT/ML IJ SOLN
10000.0000 [IU] | INTRAMUSCULAR | Status: DC
  Administered 2017-05-17 – 2017-05-20 (×2): 10000 [IU] via INTRAVENOUS

## 2017-05-17 MED ORDER — ESCITALOPRAM OXALATE 5 MG PO TABS
2.5000 mg | ORAL_TABLET | Freq: Every day | ORAL | Status: DC
  Administered 2017-05-17 – 2017-05-21 (×5): 2.5 mg via ORAL
  Filled 2017-05-17 (×5): qty 1

## 2017-05-17 MED ORDER — CLONAZEPAM 0.5 MG PO TABS
0.5000 mg | ORAL_TABLET | Freq: Two times a day (BID) | ORAL | Status: DC
  Administered 2017-05-17 – 2017-05-21 (×8): 0.5 mg via ORAL
  Filled 2017-05-17 (×8): qty 1

## 2017-05-17 NOTE — Plan of Care (Signed)
Patient is alert and oriented this shift. Patient is awake during and after care. Patient has complained about pain at different areas of her body.

## 2017-05-17 NOTE — Progress Notes (Signed)
[facility]  ROUNDING NOTE   Subjective:  Patient seen and evaluated during dialysis. Having tremors, no fever at the moment. Blood sugar checked and BG was 88. Otherwise tolerating dialysis well.   Objective:  Vital signs in last 24 hours:  Temp:  [98.7 F (37.1 C)-98.9 F (37.2 C)] 98.7 F (37.1 C) (02/16 0823) Pulse Rate:  [59-266] 67 (02/16 0930) Resp:  [16-22] 22 (02/16 0930) BP: (114-166)/(55-72) 166/62 (02/16 0915) SpO2:  [96 %-100 %] 100 % (02/16 0930) Weight:  [104.5 kg (230 lb 6.1 oz)] 104.5 kg (230 lb 6.1 oz) (02/16 0823)  Weight change:  Filed Weights   05/15/17 0730 05/15/17 1152 05/17/17 0823  Weight: 106.7 kg (235 lb 3.7 oz) 106 kg (233 lb 11 oz) 104.5 kg (230 lb 6.1 oz)    Intake/Output: No intake/output data recorded.   Intake/Output this shift:  No intake/output data recorded.  Physical Exam: General: No acute distress  Head: Normocephalic, atraumatic. Moist oral mucosal membranes  Eyes: Anicteric  Neck: Supple, trachea midline  Lungs:  Clear to auscultation, normal effort  Heart: S1S2 no rubs  Abdomen:  Soft, nontender, bowel sounds present  Extremities: Trace peripheral edema.  Neurologic: Awake but confused, mild tremor noted.  Skin: No lesions       Basic Metabolic Panel: Recent Labs  Lab 05/15/17 0201 05/15/17 0624 05/16/17 0715 05/17/17 0442  NA 133* 132* 134* 134*  K 7.1* 6.7* 4.7 4.8  CL 93* 94* 92* 92*  CO2 25 24 30 27   GLUCOSE 165* 135* 132* 127*  BUN 65* 67* 36* 58*  CREATININE 7.35* 7.64* 5.28* 6.92*  CALCIUM 8.8* 8.9 8.6* 7.9*  PHOS  --  5.1*  --   --     Liver Function Tests: Recent Labs  Lab 05/15/17 0201  AST 34  ALT 13*  ALKPHOS 157*  BILITOT 0.8  PROT 7.4  ALBUMIN 3.8   No results for input(s): LIPASE, AMYLASE in the last 168 hours. No results for input(s): AMMONIA in the last 168 hours.  CBC: Recent Labs  Lab 05/15/17 0201 05/15/17 0624 05/16/17 0715 05/17/17 0442  WBC 9.6 6.9 4.0  4.9  NEUTROABS 8.9*  --   --   --   HGB 10.5* 10.0* 9.1* 8.9*  HCT 32.1* 30.2* 28.1* 26.5*  MCV 92.0 91.5 92.4 90.5  PLT 228 200 198 202    Cardiac Enzymes: No results for input(s): CKTOTAL, CKMB, CKMBINDEX, TROPONINI in the last 168 hours.  BNP: Invalid input(s): POCBNP  CBG: Recent Labs  Lab 05/15/17 0644  GLUCAP 122*    Microbiology: Results for orders placed or performed during the hospital encounter of 05/15/17  Blood Culture (routine x 2)     Status: None (Preliminary result)   Collection Time: 05/15/17  2:01 AM  Result Value Ref Range Status   Specimen Description BLOOD LEFT HAND  Final   Special Requests   Final    BOTTLES DRAWN AEROBIC AND ANAEROBIC Blood Culture adequate volume   Culture   Final    NO GROWTH 2 DAYS Performed at [facility], [address], [place], [place] [address]    Report Status PENDING  Incomplete  Blood Culture (routine x 2)     Status: None (Preliminary result)   Collection Time: 05/15/17  2:01 AM  Result Value Ref Range Status   Specimen Description BLOOD RIGHT FA  Final   Special Requests   Final    BOTTLES DRAWN AEROBIC AND ANAEROBIC Blood Culture  adequate volume   Culture   Final    NO GROWTH 2 DAYS Performed at [facility], [address] [place], [place] [address]    Report Status PENDING  Incomplete  MRSA PCR Screening     Status: Abnormal   Collection Time: 05/15/17  5:35 AM  Result Value Ref Range Status   MRSA by PCR POSITIVE (A) NEGATIVE Final    Comment:        The GeneXpert MRSA Assay (FDA approved for NASAL specimens only), is one component of a comprehensive MRSA colonization surveillance program. It is not intended to diagnose MRSA infection nor to guide or monitor treatment for MRSA infections. RESULT CALLED TO, READ BACK BY AND VERIFIED WITH: AMY DALTON AT 0802 ON 05/15/17 [facility]. Performed at [facility], [address] [place], [place] [address]     Coagulation  Studies: No results for input(s): LABPROT, INR in the last 72 hours.  Urinalysis: Recent Labs    05/14/17 1530 05/15/17 0200  COLORURINE YELLOW* YELLOW*  LABSPEC 1.012 1.017  PHURINE 8.0 6.0  GLUCOSEU NEGATIVE 50*  HGBUR SMALL* MODERATE*  BILIRUBINUR NEGATIVE NEGATIVE  KETONESUR NEGATIVE NEGATIVE  PROTEINUR 100* 100*  NITRITE NEGATIVE NEGATIVE  LEUKOCYTESUR LARGE* NEGATIVE      Imaging: Ct Abdomen Pelvis W Contrast  Result Date: 05/15/2017 CLINICAL DATA:  Nausea, vomiting, abdominal pain, gastroenteritis or colitis suspected, history bipolar disorder, coronary artery disease, CHF, end-renal disease on dialysis, GERD, seizures, type II diabetes mellitus, former smoker EXAM: CT ABDOMEN AND PELVIS WITH CONTRAST TECHNIQUE: Multidetector CT imaging of the abdomen and pelvis was performed using the standard protocol following bolus administration of intravenous contrast. Sagittal and coronal MPR images reconstructed from axial data set. CONTRAST:  ISOVUE-300 IOPAMIDOL (ISOVUE-300) INJECTION 61% IV. Probable small amount of dilute oral contrast. COMPARISON:  03/28/2017 FINDINGS: Lower chest: Small BILATERAL pleural effusions. Compressive atelectasis RIGHT lower lobe. Hepatobiliary: Partially contracted gallbladder, unable to exclude wall thickening. Dependent density in the gallbladder likely reflects tiny stones. Liver unremarkable. Pancreas: F atrophic pancreas Spleen: Normal appearance Adrenals/Urinary Tract: Atrophic kidneys. Renovascular calcifications and questionable tiny nonobstructing RIGHT renal calculus. No definite renal mass, hydronephrosis or hydroureter. Bladder decompressed, wall appearing thick but this could represent an artifact from underdistention. Stomach/Bowel: Normal appendix, retrocecal. Large and small bowel loops otherwise normal appearance. Small hiatal hernia. Vascular/Lymphatic: Atherosclerotic calcifications of aorta and coronary arteries. Aorta normal caliber.  No adenopathy. Reproductive: Uterus and adnexa unremarkable Other: Small amount of free pelvic fluid. No free air, hernia, or acute inflammatory process. Musculoskeletal: Unremarkable IMPRESSION: Contracted gallbladder with questionable mild wall thickening Small amount of nonspecific free pelvic fluid. Tiny BILATERAL pleural effusions and mild compressive atelectasis of RIGHT lower lobe. Aortic Atherosclerosis (ICD10-I70.0). Electronically Signed   By: Ulyses Southward M.D.   On: 05/15/2017 16:40     Medications:   . sodium chloride    . sodium chloride    . sodium chloride    . aztreonam Stopped (05/16/17 2219)  . levofloxacin (LEVAQUIN) IV    . vancomycin    . vancomycin Stopped (05/15/17 1843)   . amLODipine  10 mg Oral Daily  . aspirin  81 mg Oral Daily  . atorvastatin  40 mg Oral q1800  . benztropine  0.5 mg Oral BID  . chlorhexidine  15 mL Mouth Rinse BID  . Chlorhexidine Gluconate Cloth  6 each Topical Q0600  . clonazePAM  0.5 mg Oral QHS  . clopidogrel  75 mg Oral Q1200  .  feeding supplement (NEPRO CARB STEADY)  237 mL Oral BID BM  . feeding supplement (PRO-STAT SUGAR FREE 64)  30 mL Oral TID  . fluticasone  1 spray Each Nare Daily  . heparin  5,000 Units Subcutaneous Q8H  . isosorbide mononitrate  90 mg Oral Q1200  . lactulose  10 g Oral Daily  . levETIRAcetam  1,500 mg Oral Q1200  . mouth rinse  15 mL Mouth Rinse q12n4p  . Melatonin  5 mg Oral QHS  . metoprolol tartrate  50 mg Oral BID  . multivitamin  1 tablet Oral QHS  . mupirocin ointment  1 application Nasal BID  . oseltamivir  30 mg Oral Once  . oseltamivir  30 mg Oral Q T,Th,Sat-1800  . OXcarbazepine  150 mg Oral QHS  . pantoprazole  40 mg Oral Daily  . polyvinyl alcohol  1 drop Both Eyes TID  . senna  2 tablet Oral BID  . sevelamer carbonate  800 mg Oral TID WC  . sodium chloride flush  3 mL Intravenous Q12H  . torsemide  100 mg Oral Daily   sodium chloride, sodium chloride, sodium chloride, acetaminophen  **OR** acetaminophen, albuterol, alteplase, bisacodyl, heparin, hydrALAZINE, HYDROcodone-acetaminophen, lidocaine (PF), lidocaine-prilocaine, nitroGLYCERIN, ondansetron **OR** ondansetron (ZOFRAN) IV, pentafluoroprop-tetrafluoroeth, sodium chloride flush  Assessment/ Plan:  52 y.o. female with end-stage renal disease on hemodialysis, asthma, gastroparesis, diabetes mellitus type II, congestive heart failure, hypertension, endometriosis, coronary atherosclerosis, drug-eluting stent to LAD 2017  [facility]ph[facility]/ [address]/ TTS   1.  ESRD on HD TTS.  - Seen and evaluated during HD, tolerating well.  Had mild tremor during HD, no fever noted, blood sugar checked and was found to be 88.  Continue current treatment parameters.   2.  Hyperkalemia.  Potassium currently 4.8 and at target, continue current K bath.   3.  Secondary hyperparathyroidism.  Phosphorus 5.1 at the moment and within target.   4.  Anemia of chronic kidney disease.  Hgb down to 8.9, will start on epogen 10000 units IV with HD.    LOS: 2 Bud Kaeser 2/16/201910:37 AM

## 2017-05-17 NOTE — Plan of Care (Signed)
  Progressing Respiratory: Ability to maintain adequate ventilation will improve 05/17/2017 0315 - Progressing by Stefan Church, RN

## 2017-05-17 NOTE — Progress Notes (Signed)
[facility] [place] at [facility]   PATIENT NAME: Kimberly Montgomery    MR#:  [id number]  DATE OF BIRTH:  Nov 18, 1965  SUBJECTIVE: Very tearful today, finished dialysis.  According to daughter she has more shaking of her head than before.  Some wheezing,/slightly confused.  Denies any complaints.  CHIEF COMPLAINT:   Chief Complaint  Patient presents with  . Altered Mental Status   REVIEW OF SYSTEMS:  Review of Systems  Unable to perform ROS: Mental status change    DRUG ALLERGIES:   Allergies  Allergen Reactions  . Cephalosporins Anaphylaxis    Patient has tolerated meropenem.   [name] Penicillins Anaphylaxis and Other (See Comments)    Has patient had a PCN reaction causing immediate rash, facial/tongue/throat swelling, SOB or lightheadedness with hypotension: Yes Has patient had a PCN reaction causing severe rash involving mucus membranes or skin necrosis: No Has patient had a PCN reaction that required hospitalization No Has patient had a PCN reaction occurring within the last 10 years: No If all of the above answers are "NO", then may proceed with Cephalosporin use.  . Reglan [Metoclopramide] Other (See Comments)    Severe EPS (lip, head, body tremors) March 2018  . Risperdal [Risperidone] Other (See Comments)    Severe EPS (lip, head, body tremors) March 2018  . Lamictal [Lamotrigine] Other (See Comments)    Reaction:  Hallucinations  . Phenergan [Promethazine Hcl] Nausea And Vomiting    Sensitivity to medicine  . Pravastatin Other (See Comments)    Reaction:  Muscle pain   . Sulfa Antibiotics Other (See Comments)    Reaction:  Unknown    VITALS:  Blood pressure (!) 170/63, pulse 81, temperature 98.9 F (37.2 C), temperature source Oral, resp. rate 16, height 5\' 6"  (1.676 m), weight 104.5 kg (230 lb 6.1 oz), last menstrual period 03/16/2015, SpO2 94 %. PHYSICAL EXAMINATION:  Physical Exam  Constitutional: She is well-developed, well-nourished, and in no  distress.  HENT:  Head: Normocephalic and atraumatic.  Eyes: Conjunctivae and EOM are normal. Pupils are equal, round, and reactive to light.  Neck: Normal range of motion. Neck supple. No tracheal deviation present. No thyromegaly present.  Cardiovascular: Normal rate, regular rhythm and normal heart sounds.  Pulmonary/Chest: Effort normal. No respiratory distress. She has wheezes. She exhibits no tenderness.  Abdominal: Soft. Bowel sounds are normal. She exhibits no distension. There is no tenderness.  Musculoskeletal: Normal range of motion.  Neurological: She is alert. She is disoriented. No cranial nerve deficit.  Pleasantly confused  Skin: Skin is warm and dry. No rash noted.  Psychiatric: Mood and affect normal.  Tearful   LABORATORY PANEL:  Female CBC Recent Labs  Lab 05/17/17 0442  WBC 4.9  HGB 8.9*  HCT 26.5*  PLT 202   ------------------------------------------------------------------------------------------------------------------ Chemistries  Recent Labs  Lab 05/15/17 0201  05/17/17 0442  NA 133*   < > 134*  K 7.1*   < > 4.8  CL 93*   < > 92*  CO2 25   < > 27  GLUCOSE 165*   < > 127*  BUN 65*   < > 58*  CREATININE 7.35*   < > 6.92*  CALCIUM 8.8*   < > 7.9*  AST 34  --   --   ALT 13*  --   --   ALKPHOS 157*  --   --   BILITOT 0.8  --   --    < > = values in  this interval not displayed.   RADIOLOGY:  No results found. ASSESSMENT AND PLAN:  52 year old female patient with history of end-stage renal disease on dialysis, hyperlipidemia, hypertension, coronary disease, congestive heart failure admitted for lethargy and confusion.  1.Healthcare associated pneumonia: continue IV vanco + Levaquin, continue oxygen to keep sats more than 90%.  Does have some wheezing, continue bronchodilators, add small dose IV steroid. 2.Flu like syndrome: symptomatic mgmt, tamilfu, continue Tamiflu with hemodialysis. 3.Hyperkalemia:: Improved with  hemodialysis. 4.End-stage renal disease on dialysis:  5.  Abd pain, N/V:  CT A-P showing no acute patho 6. Confusion: likely due to uremia, slowly improving. Physical therapy evaluation 8.  Myoclonic jerks: Patient is already on Klonopin, increase the dose.     All the records are reviewed and case discussed with Care Management/Social Worker. Management plans discussed with the patient, nephro and they are in agreement.  CODE STATUS: Full Code  TOTAL TIME TAKING CARE OF THIS PATIENT: 35 minutes.   More than 50% of the time was spent in counseling/coordination of care: YES  POSSIBLE D/C IN 1-2 DAYS, DEPENDING ON CLINICAL CONDITION.   Katha Hamming M.D on 05/17/2017 at 2:19 PM  Between 7am to 6pm - Pager - [phone]  After 6pm go to [email or web address] - Social research officer, government  [facility]  Office  [phone]  CC: Primary care physician; Dorothey Baseman, MD  Note: This dictation was prepared with Dragon dictation along with smaller phrase technology. Any transcriptional errors that result from this process are unintentional.

## 2017-05-17 NOTE — Progress Notes (Addendum)
Patient complained of left sided 8/10chest pain at 1930 administered 2 norco without any relief after 1 hour. Adminstering sublingual nitro glycerin, patient is having relief. Pain is 4/10 after 2 sublingual nitroglycerin. Patient was complaining of right leg pain, notified Dr.Maeir to resume home dose of gabapentin.

## 2017-05-18 NOTE — Progress Notes (Signed)
[facility] - [place] at [facility]   PATIENT NAME: Kimberly Montgomery    MR#:  [id number]  DATE OF BIRTH:  [birth year]  SUBJECTIVE: She is happy today, denies any complaints, eating better than yesterday.  CHIEF COMPLAINT:   Chief Complaint  Patient presents with  . Altered Mental Status   REVIEW OF SYSTEMS:  Review of Systems  Constitutional: Negative for chills and fever.  HENT: Negative for hearing loss.   Eyes: Negative for blurred vision, double vision and photophobia.  Respiratory: Negative for cough, hemoptysis and shortness of breath.   Cardiovascular: Negative for palpitations, orthopnea and leg swelling.  Gastrointestinal: Negative for abdominal pain, diarrhea and vomiting.  Genitourinary: Negative for dysuria and urgency.  Musculoskeletal: Negative for myalgias and neck pain.  Skin: Negative for rash.  Neurological: Negative for dizziness, focal weakness, seizures, weakness and headaches.  Psychiatric/Behavioral: Negative for memory loss. The patient does not have insomnia.   Physical examination: Patient alert, awake, oriented Cardiovascular system: S1, S2 regular Lungs: Clear to auscultation, no wheeze, no rales.  Abdomen: Obese, bowel sounds present. Lower extremities: Patient hash/o right BKA, no edema in the left leg.  DRUG ALLERGIES:   Allergies  Allergen Reactions  . Cephalosporins Anaphylaxis    Patient has tolerated meropenem.   [name] Penicillins Anaphylaxis and Other (See Comments)    Has patient had a PCN reaction causing immediate rash, facial/tongue/throat swelling, SOB or lightheadedness with hypotension: Yes Has patient had a PCN reaction causing severe rash involving mucus membranes or skin necrosis: No Has patient had a PCN reaction that required hospitalization No Has patient had a PCN reaction occurring within the last 10 years: No If all of the above answers are "NO", then may proceed with Cephalosporin use.  . Reglan  [Metoclopramide] Other (See Comments)    Severe EPS (lip, head, body tremors) March 2018  . Risperdal [Risperidone] Other (See Comments)    Severe EPS (lip, head, body tremors) March 2018  . Lamictal [Lamotrigine] Other (See Comments)    Reaction:  Hallucinations  . Phenergan [Promethazine Hcl] Nausea And Vomiting    Sensitivity to medicine  . Pravastatin Other (See Comments)    Reaction:  Muscle pain   . Sulfa Antibiotics Other (See Comments)    Reaction:  Unknown    VITALS:  Blood pressure (!) 160/60, pulse 65, temperature 97.9 F (36.6 C), temperature source Oral, resp. rate 16, height 5\' 6"  (1.676 m), weight 105.3 kg (232 lb 2.3 oz), last menstrual period 03/16/2015, SpO2 99 %. PHYSICAL EXAMINATION:   LABORATORY PANEL:  Female CBC Recent Labs  Lab 05/17/17 0442  WBC 4.9  HGB 8.9*  HCT 26.5*  PLT 202   ------------------------------------------------------------------------------------------------------------------ Chemistries  Recent Labs  Lab 05/15/17 0201  05/17/17 0442  NA 133*   < > 134*  K 7.1*   < > 4.8  CL 93*   < > 92*  CO2 25   < > 27  GLUCOSE 165*   < > 127*  BUN 65*   < > 58*  CREATININE 7.35*   < > 6.92*  CALCIUM 8.8*   < > 7.9*  AST 34  --   --   ALT 13*  --   --   ALKPHOS 157*  --   --   BILITOT 0.8  --   --    < > = values in this interval not displayed.   RADIOLOGY:  No results found. ASSESSMENT AND PLAN:  52 year old female patient  with history of end-stage renal disease on dialysis, hyperlipidemia, hypertension, coronary disease, congestive heart failure admitted for lethargy and confusion.  1.Healthcare associated pneumonia: continue IV vanco + Levaquin, continue oxygen to keep sats more than 90%.   improving.  Likely discharge back to [facility] next 1-2 days.   2.Flu like syndrome: symptomatic mgmt, tamilfu, continue Tamiflu with hemodialysis. 3.Hyperkalemia:: Improved with hemodialysis. 4.End-stage renal disease on  dialysis:  5.  Abd pain, N/V:  CT A-P showing no acute patho 6. Confusion: likely due to uremia, slowly improving, patient alert, awake, oriented..  8.  Myoclonic jerks: Patient is already on Klonopin, increase the dose.  #9 bedridden, up from [facility].  Discharge back to [facility] in 1-2 days.  #10/ history of depression: Restarted Celexa.  Her mood is better today. All the records are reviewed and case discussed with Care Management/Social Worker. Management plans discussed with the patient, nephro and they are in agreement.  CODE STATUS: Full Code  TOTAL TIME TAKING CARE OF THIS PATIENT: 35 minutes.   More than 50% of the time was spent in counseling/coordination of care: YES  POSSIBLE D/C IN 1-2 DAYS, DEPENDING ON CLINICAL CONDITION.   Katha Hamming M.D on 05/18/2017 at 12:57 PM  Between 7am to 6pm - Pager - [phone]  After 6pm go to [email or web address] - Social research officer, government  [facility] [place] [facility]  Office  [phone]  CC: Primary care physician; Dorothey Baseman, MD  Note: This dictation was prepared with Dragon dictation along with smaller phrase technology. Any transcriptional errors that result from this process are unintentional.

## 2017-05-18 NOTE — Progress Notes (Signed)
[facility]  ROUNDING NOTE   Subjective:  Patient underwent hemodialysis yesterday. She tolerated this well. She is much more awake and alert today.  Objective:  Vital signs in last 24 hours:  Temp:  [97.7 F (36.5 C)-98.4 F (36.9 C)] 97.9 F (36.6 C) (02/17 0800) Pulse Rate:  [65-90] 65 (02/17 0400) Resp:  [16-20] 16 (02/17 0800) BP: (141-184)/(55-82) 160/60 (02/17 0800) SpO2:  [93 %-100 %] 99 % (02/17 0800) Weight:  [105.3 kg (232 lb 2.3 oz)] 105.3 kg (232 lb 2.3 oz) (02/17 0400)  Weight change:  Filed Weights   05/15/17 1152 05/17/17 0823 05/18/17 0400  Weight: 106 kg (233 lb 11 oz) 104.5 kg (230 lb 6.1 oz) 105.3 kg (232 lb 2.3 oz)    Intake/Output: I/O last 3 completed shifts: In: 100 [IV Piggyback:100] Out: 0    Intake/Output this shift:  No intake/output data recorded.  Physical Exam: General: No acute distress  Head: Normocephalic, atraumatic. Moist oral mucosal membranes  Eyes: Anicteric  Neck: Supple, trachea midline  Lungs:  Clear to auscultation, normal effort  Heart: S1S2 no rubs  Abdomen:  Soft, nontender, bowel sounds present  Extremities: Trace peripheral edema.  Neurologic: Awake, alert, following commands  Skin: No lesions       Basic Metabolic Panel: Recent Labs  Lab 05/15/17 0201 05/15/17 0624 05/16/17 0715 05/17/17 0442  NA 133* 132* 134* 134*  K 7.1* 6.7* 4.7 4.8  CL 93* 94* 92* 92*  CO2 25 24 30 27   GLUCOSE 165* 135* 132* 127*  BUN 65* 67* 36* 58*  CREATININE 7.35* 7.64* 5.28* 6.92*  CALCIUM 8.8* 8.9 8.6* 7.9*  PHOS  --  5.1*  --   --     Liver Function Tests: Recent Labs  Lab 05/15/17 0201  AST 34  ALT 13*  ALKPHOS 157*  BILITOT 0.8  PROT 7.4  ALBUMIN 3.8   No results for input(s): LIPASE, AMYLASE in the last 168 hours. No results for input(s): AMMONIA in the last 168 hours.  CBC: Recent Labs  Lab 05/15/17 0201 05/15/17 0624 05/16/17 0715 05/17/17 0442  WBC 9.6 6.9 4.0 4.9  NEUTROABS 8.9*  --    --   --   HGB 10.5* 10.0* 9.1* 8.9*  HCT 32.1* 30.2* 28.1* 26.5*  MCV 92.0 91.5 92.4 90.5  PLT 228 200 198 202    Cardiac Enzymes: No results for input(s): CKTOTAL, CKMB, CKMBINDEX, TROPONINI in the last 168 hours.  BNP: Invalid input(s): POCBNP  CBG: Recent Labs  Lab 05/15/17 0644 05/17/17 1036  GLUCAP 122* 88    Microbiology: Results for orders placed or performed during the hospital encounter of 05/15/17  Blood Culture (routine x 2)     Status: None (Preliminary result)   Collection Time: 05/15/17  2:01 AM  Result Value Ref Range Status   Specimen Description BLOOD LEFT HAND  Final   Special Requests   Final    BOTTLES DRAWN AEROBIC AND ANAEROBIC Blood Culture adequate volume   Culture   Final    NO GROWTH 3 DAYS Performed at [facility], [address], [place], [place] [address]    Report Status PENDING  Incomplete  Blood Culture (routine x 2)     Status: None (Preliminary result)   Collection Time: 05/15/17  2:01 AM  Result Value Ref Range Status   Specimen Description BLOOD RIGHT FA  Final   Special Requests   Final    BOTTLES DRAWN AEROBIC AND ANAEROBIC Blood Culture  adequate volume   Culture   Final    NO GROWTH 3 DAYS Performed at [facility], [address] [place], [place] [address]    Report Status PENDING  Incomplete  MRSA PCR Screening     Status: Abnormal   Collection Time: 05/15/17  5:35 AM  Result Value Ref Range Status   MRSA by PCR POSITIVE (A) NEGATIVE Final    Comment:        The GeneXpert MRSA Assay (FDA approved for NASAL specimens only), is one component of a comprehensive MRSA colonization surveillance program. It is not intended to diagnose MRSA infection nor to guide or monitor treatment for MRSA infections. RESULT CALLED TO, READ BACK BY AND VERIFIED WITH: AMY DALTON AT 0802 ON 05/15/17 [facility]. Performed at [facility], [address] [place], [place] [address]     Coagulation Studies: No  results for input(s): LABPROT, INR in the last 72 hours.  Urinalysis: No results for input(s): COLORURINE, LABSPEC, PHURINE, GLUCOSEU, HGBUR, BILIRUBINUR, KETONESUR, PROTEINUR, UROBILINOGEN, NITRITE, LEUKOCYTESUR in the last 72 hours.  Invalid input(s): APPERANCEUR    Imaging: No results found.   Medications:   . sodium chloride    . levofloxacin (LEVAQUIN) IV Stopped (05/17/17 1505)  . vancomycin Stopped (05/17/17 1844)   . amLODipine  10 mg Oral Daily  . aspirin  81 mg Oral Daily  . atorvastatin  40 mg Oral q1800  . benztropine  0.5 mg Oral BID  . chlorhexidine  15 mL Mouth Rinse BID  . Chlorhexidine Gluconate Cloth  6 each Topical Q0600  . clonazePAM  0.5 mg Oral BID  . clopidogrel  75 mg Oral Q1200  . epoetin (EPOGEN/PROCRIT) injection  10,000 Units Intravenous Q T,Th,Sa-HD  . escitalopram  2.5 mg Oral Daily  . feeding supplement (NEPRO CARB STEADY)  237 mL Oral BID BM  . feeding supplement (PRO-STAT SUGAR FREE 64)  30 mL Oral TID  . fluticasone  1 spray Each Nare Daily  . heparin  5,000 Units Subcutaneous Q8H  . isosorbide mononitrate  90 mg Oral Q1200  . lactulose  10 g Oral Daily  . levETIRAcetam  1,500 mg Oral Q1200  . mouth rinse  15 mL Mouth Rinse q12n4p  . Melatonin  5 mg Oral QHS  . methylPREDNISolone (SOLU-MEDROL) injection  60 mg Intravenous Q24H  . metoprolol tartrate  50 mg Oral BID  . multivitamin  1 tablet Oral QHS  . mupirocin ointment  1 application Nasal BID  . oseltamivir  30 mg Oral Once  . oseltamivir  30 mg Oral Q T,Th,Sat-1800  . OXcarbazepine  150 mg Oral QHS  . pantoprazole  40 mg Oral Daily  . polyvinyl alcohol  1 drop Both Eyes TID  . senna  2 tablet Oral BID  . sevelamer carbonate  800 mg Oral TID WC  . sodium chloride flush  3 mL Intravenous Q12H  . torsemide  100 mg Oral Daily   sodium chloride, acetaminophen **OR** acetaminophen, albuterol, bisacodyl, hydrALAZINE, HYDROcodone-acetaminophen, nitroGLYCERIN, ondansetron **OR**  ondansetron (ZOFRAN) IV, sodium chloride flush  Assessment/ Plan:  52 y.o. female with end-stage renal disease on hemodialysis, asthma, gastroparesis, diabetes mellitus type II, congestive heart failure, hypertension, endometriosis, coronary atherosclerosis, drug-eluting stent to LAD 2017  [facility] Nephrology/ [address]/ TTS   1.  ESRD on HD TTS.  -Patient completed hemodialysis yesterday.  No acute indication for dialysis today.  Next dialysis treatment on Tuesday.  2.  Hyperkalemia.  Serum was significantly  elevated on admission.  Since then potassium is normalized with dialysis.  3.  Secondary hyperparathyroidism.  Maintain the patient on Renvela 800 mg p.o. 3 times daily.  Most recent serum phosphorus was 5.1.  4.  Anemia of chronic kidney disease.  Maintain the patient on Epogen 10,000 units IV with dialysis.  LOS: 3 Tanish Prien 2/17/20191:23 PM

## 2017-05-18 NOTE — [place] FL2 (Signed)
[place] MEDICAID FL2 LEVEL OF CARE SCREENING TOOL     IDENTIFICATION  Patient Name: Kimberly Montgomery Birthdate: [birth year] Sex: female Admission Date (Current Location): 05/15/2017  [facility] and [place] Number:  Chiropodist and Address:  [facility], [address] [place], [place] [address]      Provider Number: [id number]  Attending Physician Name and Address:  Katha Hamming, MD  Relative Name and Phone Number:       Current Level of Care: Hospital Recommended Level of Care: Skilled Nursing Facility Prior Approval Number:    Date Approved/Denied:   PASRR Number:    Discharge Plan: SNF    Current Diagnoses: Patient Active Problem List   Diagnosis Date Noted  . Pneumonia 05/15/2017  . Benign essential tremor 01/30/2017  . Seizure (HCC)   . Acute encephalopathy   . Tremor   . Cerebrovascular disease   . Complication from renal dialysis device 04/08/2016  . Colitis 03/13/2016  . Chronic diastolic congestive heart failure (HCC) 01/22/2016  . Pressure injury of skin 01/20/2016  . Bacteremia, coagulase-negative staphylococcal 12/27/2015  . Essential hypertension 11/21/2015  . Anemia of chronic disease 11/21/2015  . Acute respiratory failure with hypoxia (HCC) 11/21/2015  . ESRD on dialysis (HCC) 11/21/2015  . 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 NSTEMI (non-ST elevated myocardial infarction) (HCC) 11/20/2015  . Chest pain, rule out acute myocardial infarction 11/18/2015  . Bipolar I disorder, most recent episode depressed (HCC)   . Altered mental status 08/24/2015  . Ileus (HCC)   . Bipolar I disorder (HCC) 07/25/2015  . Seizures (HCC) 07/25/2015  . Peritonitis (HCC) 07/16/2015  . Unstable angina (HCC) 07/09/2015  . Accelerated hypertension 07/09/2015  . Type 2 diabetes mellitus (HCC) 07/09/2015  . CAD (coronary artery disease) 07/09/2015  . HLD (hyperlipidemia) 07/09/2015  . GERD (gastroesophageal reflux disease) 07/09/2015     Orientation RESPIRATION BLADDER Height & Weight     Self, Time, Situation, Place  O2(3L o2) Incontinent Weight: 232 lb 2.3 oz (105.3 kg) Height:  5\' 6"  (167.6 cm)  BEHAVIORAL SYMPTOMS/MOOD NEUROLOGICAL BOWEL NUTRITION STATUS      Continent Diet(Renal with fluid restriction, 1200 mL)  AMBULATORY STATUS COMMUNICATION OF NEEDS Skin   Total Care Verbally Normal                       Personal Care Assistance Level of Assistance  Bathing, Feeding, Dressing Bathing Assistance: Limited assistance Feeding assistance: Independent Dressing Assistance: Limited assistance     Functional Limitations Info             SPECIAL CARE FACTORS FREQUENCY                       Contractures Contractures Info: Not present    Additional Factors Info  Code Status, Allergies, Psychotropic Code Status Info: Full Allergies Info: Cephalosporins, Penicillins, Reglan Metoclopramide, Risperdal Risperidone, Lamictal Lamotrigine, Phenergan Promethazine Hcl, Pravastatin, Sulfa Antibiotics Psychotropic Info: Lexapro, Celexa, Klonopin, Cogentin         Current Medications (05/18/2017):  This is the current hospital active medication list Current Facility-Administered Medications  Medication Dose Route Frequency Provider Last Rate Last Dose  . 0.9 %  sodium chloride infusion  250 mL Intravenous PRN Pyreddy, Vivien Rota, MD      . acetaminophen (TYLENOL) tablet 650 mg  650 mg Oral Q6H PRN Ihor Austin, MD   650 mg at 05/17/17 1334   Or  . acetaminophen (TYLENOL) suppository  650 mg  650 mg Rectal Q6H PRN Pyreddy, Vivien Rota, MD      . albuterol (PROVENTIL) (2.5 MG/3ML) 0.083% nebulizer solution 2.5 mg  2.5 mg Nebulization Q4H PRN Delfino Lovett, MD   2.5 mg at 05/17/17 1316  . amLODipine (NORVASC) tablet 10 mg  10 mg Oral Daily Pyreddy, Vivien Rota, MD   10 mg at 05/18/17 1001  . aspirin chewable tablet 81 mg  81 mg Oral Daily Pyreddy, Vivien Rota, MD   81 mg at 05/18/17 1002  . atorvastatin (LIPITOR) tablet 40 mg   40 mg Oral q1800 Ihor Austin, MD   40 mg at 05/17/17 1701  . benztropine (COGENTIN) tablet 0.5 mg  0.5 mg Oral BID Ihor Austin, MD   0.5 mg at 05/17/17 2124  . bisacodyl (DULCOLAX) suppository 10 mg  10 mg Rectal PRN Ihor Austin, MD      . chlorhexidine (PERIDEX) 0.12 % solution 15 mL  15 mL Mouth Rinse BID Ihor Austin, MD   15 mL at 05/17/17 2122  . Chlorhexidine Gluconate Cloth 2 % PADS 6 each  6 each Topical Q0600 Delfino Lovett, MD   6 each at 05/18/17 0534  . clonazePAM (KLONOPIN) tablet 0.5 mg  0.5 mg Oral BID Katha Hamming, MD   0.5 mg at 05/18/17 1001  . clopidogrel (PLAVIX) tablet 75 mg  75 mg Oral Q1200 Ihor Austin, MD   75 mg at 05/18/17 1252  . epoetin alfa (EPOGEN,PROCRIT) injection 10,000 Units  10,000 Units Intravenous Q T,Th,Sa-HD Cherylann Ratel, [place], MD   10,000 Units at 05/17/17 1042  . escitalopram (LEXAPRO) tablet 2.5 mg  2.5 mg Oral Daily Katha Hamming, MD   2.5 mg at 05/18/17 0959  . feeding supplement (NEPRO CARB STEADY) liquid 237 mL  237 mL Oral BID BM Sherryll Burger, Vipul, MD   237 mL at 05/17/17 1248  . feeding supplement (PRO-STAT SUGAR FREE 64) liquid 30 mL  30 mL Oral TID Ihor Austin, MD   30 mL at 05/17/17 1248  . fluticasone (FLONASE) 50 MCG/ACT nasal spray 1 spray  1 spray Each Nare Daily Pyreddy, Pavan, MD   1 spray at 05/18/17 0959  . heparin injection 5,000 Units  5,000 Units Subcutaneous Q8H Pyreddy, Vivien Rota, MD   5,000 Units at 05/18/17 0535  . hydrALAZINE (APRESOLINE) injection 10 mg  10 mg Intravenous Q6H PRN Delfino Lovett, MD      . HYDROcodone-acetaminophen (NORCO/VICODIN) 5-325 MG per tablet 1-2 tablet  1-2 tablet Oral Q6H PRN Cammy Copa, MD   1 tablet at 05/18/17 1035  . isosorbide mononitrate (IMDUR) 24 hr tablet 90 mg  90 mg Oral Q1200 Ihor Austin, MD   90 mg at 05/18/17 1251  . lactulose (CHRONULAC) 10 GM/15ML solution 10 g  10 g Oral Daily Pyreddy, Vivien Rota, MD   10 g at 05/18/17 0959  . levETIRAcetam (KEPPRA) tablet 1,500 mg  1,500 mg  Oral Q1200 Pyreddy, Vivien Rota, MD   1,500 mg at 05/18/17 1251  . levofloxacin (LEVAQUIN) IVPB 500 mg  500 mg Intravenous Q48H Ihor Austin, MD   Stopped at 05/17/17 1505  . MEDLINE mouth rinse  15 mL Mouth Rinse q12n4p Pyreddy, Vivien Rota, MD   15 mL at 05/16/17 1221  . Melatonin TABS 5 mg  5 mg Oral QHS Ihor Austin, MD   5 mg at 05/17/17 2127  . methylPREDNISolone sodium succinate (SOLU-MEDROL) 125 mg/2 mL injection 60 mg  60 mg Intravenous Q24H Katha Hamming, MD   60 mg at 05/17/17 1512  . metoprolol  tartrate (LOPRESSOR) tablet 50 mg  50 mg Oral BID Ihor Austin, MD   50 mg at 05/18/17 1002  . multivitamin (RENA-VIT) tablet 1 tablet  1 tablet Oral QHS Delfino Lovett, MD   1 tablet at 05/17/17 2125  . mupirocin ointment (BACTROBAN) 2 % 1 application  1 application Nasal BID Delfino Lovett, MD   1 application at 05/18/17 0959  . nitroGLYCERIN (NITROSTAT) SL tablet 0.4 mg  0.4 mg Sublingual Q5 min PRN Ihor Austin, MD   0.4 mg at 05/17/17 2128  . ondansetron (ZOFRAN) tablet 4 mg  4 mg Oral Q6H PRN Ihor Austin, MD       Or  . ondansetron (ZOFRAN) injection 4 mg  4 mg Intravenous Q6H PRN Ihor Austin, MD   4 mg at 05/16/17 1001  . oseltamivir (TAMIFLU) capsule 30 mg  30 mg Oral Once Pyreddy, Vivien Rota, MD      . oseltamivir (TAMIFLU) capsule 30 mg  30 mg Oral Q T,Th,Sat-1800 Pyreddy, Pavan, MD   30 mg at 05/17/17 1659  . OXcarbazepine (TRILEPTAL) tablet 150 mg  150 mg Oral QHS Ihor Austin, MD   150 mg at 05/17/17 2124  . pantoprazole (PROTONIX) EC tablet 40 mg  40 mg Oral Daily Pyreddy, Pavan, MD   40 mg at 05/18/17 1002  . polyvinyl alcohol (LIQUIFILM TEARS) 1.4 % ophthalmic solution 1 drop  1 drop Both Eyes TID Ihor Austin, MD   1 drop at 05/18/17 0958  . senna (SENOKOT) tablet 17.2 mg  2 tablet Oral BID Ihor Austin, MD   17.2 mg at 05/18/17 0959  . sevelamer carbonate (RENVELA) tablet 800 mg  800 mg Oral TID WC Pyreddy, Vivien Rota, MD   800 mg at 05/18/17 1252  . sodium chloride flush (NS)  0.9 % injection 3 mL  3 mL Intravenous Q12H Pyreddy, Vivien Rota, MD   3 mL at 05/18/17 1003  . sodium chloride flush (NS) 0.9 % injection 3 mL  3 mL Intravenous PRN Pyreddy, Vivien Rota, MD      . torsemide (DEMADEX) tablet 100 mg  100 mg Oral Daily Pyreddy, Pavan, MD   100 mg at 05/18/17 1001  . vancomycin (VANCOCIN) IVPB 1000 mg/200 mL premix  1,000 mg Intravenous Q T,Th,Sat-1800 Ihor Austin, MD   Stopped at 05/17/17 1844     Discharge Medications: Please see discharge summary for a list of discharge medications.  Relevant Imaging Results:  Relevant Lab Results:   Additional Information    Judi Cong, LCSW

## 2017-05-18 NOTE — Clinical Social Work Note (Signed)
CSW is aware through chart review that the patient admitted from [facility]. CSW will assess when able.  Argentina Ponder, MSW, [name] [phone]

## 2017-05-18 NOTE — Clinical Social Work Note (Signed)
Clinical Social Work Assessment  Patient Details  Name: Kimberly Montgomery MRN: [id number] Date of Birth: [birth year]  Date of referral:  05/18/17               Reason for consult:  Facility Placement                Permission sought to share information with:  Facility Art therapist granted to share information::  Yes, Verbal Permission Granted  Name::        Agency::     Relationship::     Contact Information:     Housing/Transportation Living arrangements for the past 2 months:  Independence of Information:  Patient, Medical Team, Facility Patient Interpreter Needed:  None Criminal Activity/Legal Involvement Pertinent to Current Situation/Hospitalization:  No - Comment as needed 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  Yes Need for family participation in patient care:  No (Coment)  Care giving concerns:  Patient admitted from [facility]   Social Worker assessment / plan:  CSW met with the patient at bedside to confirm that she is a resident of [facility]. The patient confirmed and reported that she would like to return when stable. [facility] confirmed that the patient can return and is a LTC resident.  The plan is discharge in 1-2 days pending stability. CSW will facilitate discharge when the patient is stable.  Employment status:  Disabled (Comment on whether or not currently receiving Disability) Insurance information:  Managed Medicare PT Recommendations:  Not assessed at this time Information / Referral to community resources:     Patient/Family's Understanding of and Emotional Response to Diagnosis, Current Treatment, and Prognosis:  The patient is in agreement with the discharge plan and thanked the CSW.  Emotional Assessment Appearance:  Appears stated age Attitude/Demeanor/Rapport:  Lethargic Affect (typically  observed):  Stable Orientation:  Oriented to Self, Oriented to Place, Oriented to  Time, Oriented to Situation Alcohol / Substance use:  Never Used Psych involvement (Current and /or in the community):  Yes (Comment)(Patient has bipolar d/o with medication controlling symptoms)  Discharge Needs  Concerns to be addressed:  Care Coordination, Discharge Planning Concerns Readmission within the last 30 days:  No Current discharge risk:  None Barriers to Discharge:  Continued Medical Work up   [employer], LCSW 05/18/2017, 4:35 PM

## 2017-05-19 MED ORDER — GABAPENTIN 300 MG PO CAPS
300.0000 mg | ORAL_CAPSULE | Freq: Every day | ORAL | Status: DC
  Administered 2017-05-19 – 2017-05-21 (×3): 300 mg via ORAL
  Filled 2017-05-19 (×3): qty 1

## 2017-05-19 NOTE — Progress Notes (Signed)
[facility]  ROUNDING NOTE   Subjective:  Patient is doing well today.  Denies any acute complaints or shortness of breath Appetite is poor  Objective:  Vital signs in last 24 hours:  Temp:  [97.4 F (36.3 C)-98.6 F (37 C)] 97.4 F (36.3 C) (02/18 1616) Pulse Rate:  [57-86] 65 (02/18 1616) Resp:  [18-21] 18 (02/18 1616) BP: (153-180)/(54-86) 160/86 (02/18 1616) SpO2:  [93 %-98 %] 98 % (02/18 1616) Weight:  [107.3 kg (236 lb 8.9 oz)] 107.3 kg (236 lb 8.9 oz) (02/18 0615)  Weight change: 2.8 kg (6 lb 2.8 oz) Filed Weights   05/17/17 0823 05/18/17 0400 05/19/17 0615  Weight: 104.5 kg (230 lb 6.1 oz) 105.3 kg (232 lb 2.3 oz) 107.3 kg (236 lb 8.9 oz)    Intake/Output: I/O last 3 completed shifts: In: 240 [P.O.:240] Out: 300 [Urine:300]   Intake/Output this shift:  No intake/output data recorded.  Physical Exam: General: No acute distress  Head: Normocephalic, atraumatic. Moist oral mucosal membranes  Eyes: Anicteric  Neck: Supple, trachea midline  Lungs:  Clear to auscultation, normal effort  Heart: S1S2 no rubs  Abdomen:  Soft, nontender, bowel sounds present  Extremities: Trace peripheral edema.  Right BKA  Neurologic: Awake, alert, following commands  Skin: No lesions       Basic Metabolic Panel: Recent Labs  Lab 05/15/17 0201 05/15/17 0624 05/16/17 0715 05/17/17 0442  NA 133* 132* 134* 134*  K 7.1* 6.7* 4.7 4.8  CL 93* 94* 92* 92*  CO2 25 24 30 27   GLUCOSE 165* 135* 132* 127*  BUN 65* 67* 36* 58*  CREATININE 7.35* 7.64* 5.28* 6.92*  CALCIUM 8.8* 8.9 8.6* 7.9*  PHOS  --  5.1*  --   --     Liver Function Tests: Recent Labs  Lab 05/15/17 0201  AST 34  ALT 13*  ALKPHOS 157*  BILITOT 0.8  PROT 7.4  ALBUMIN 3.8   No results for input(s): LIPASE, AMYLASE in the last 168 hours. No results for input(s): AMMONIA in the last 168 hours.  CBC: Recent Labs  Lab 05/15/17 0201 05/15/17 0624 05/16/17 0715 05/17/17 0442  WBC 9.6 6.9  4.0 4.9  NEUTROABS 8.9*  --   --   --   HGB 10.5* 10.0* 9.1* 8.9*  HCT 32.1* 30.2* 28.1* 26.5*  MCV 92.0 91.5 92.4 90.5  PLT 228 200 198 202    Cardiac Enzymes: No results for input(s): CKTOTAL, CKMB, CKMBINDEX, TROPONINI in the last 168 hours.  BNP: Invalid input(s): POCBNP  CBG: Recent Labs  Lab 05/15/17 0644 05/17/17 1036  GLUCAP 122* 88    Microbiology: Results for orders placed or performed during the hospital encounter of 05/15/17  Blood Culture (routine x 2)     Status: None (Preliminary result)   Collection Time: 05/15/17  2:01 AM  Result Value Ref Range Status   Specimen Description BLOOD LEFT HAND  Final   Special Requests   Final    BOTTLES DRAWN AEROBIC AND ANAEROBIC Blood Culture adequate volume   Culture   Final    NO GROWTH 4 DAYS Performed at [facility], [address] [place], [place] [address]    Report Status PENDING  Incomplete  Blood Culture (routine x 2)     Status: None (Preliminary result)   Collection Time: 05/15/17  2:01 AM  Result Value Ref Range Status   Specimen Description BLOOD RIGHT FA  Final   Special Requests   Final  BOTTLES DRAWN AEROBIC AND ANAEROBIC Blood Culture adequate volume   Culture   Final    NO GROWTH 4 DAYS Performed at [facility], [address] [place], [place] [address]    Report Status PENDING  Incomplete  MRSA PCR Screening     Status: Abnormal   Collection Time: 05/15/17  5:35 AM  Result Value Ref Range Status   MRSA by PCR POSITIVE (A) NEGATIVE Final    Comment:        The GeneXpert MRSA Assay (FDA approved for NASAL specimens only), is one component of a comprehensive MRSA colonization surveillance program. It is not intended to diagnose MRSA infection nor to guide or monitor treatment for MRSA infections. RESULT CALLED TO, READ BACK BY AND VERIFIED WITH: AMY DALTON AT 0802 ON 05/15/17 [facility]. Performed at [facility], [address] [place], [place] [address]      Coagulation Studies: No results for input(s): LABPROT, INR in the last 72 hours.  Urinalysis: No results for input(s): COLORURINE, LABSPEC, PHURINE, GLUCOSEU, HGBUR, BILIRUBINUR, KETONESUR, PROTEINUR, UROBILINOGEN, NITRITE, LEUKOCYTESUR in the last 72 hours.  Invalid input(s): APPERANCEUR    Imaging: No results found.   Medications:   . sodium chloride     . amLODipine  10 mg Oral Daily  . aspirin  81 mg Oral Daily  . atorvastatin  40 mg Oral q1800  . benztropine  0.5 mg Oral BID  . chlorhexidine  15 mL Mouth Rinse BID  . Chlorhexidine Gluconate Cloth  6 each Topical Q0600  . clonazePAM  0.5 mg Oral BID  . clopidogrel  75 mg Oral Q1200  . epoetin (EPOGEN/PROCRIT) injection  10,000 Units Intravenous Q T,Th,Sa-HD  . escitalopram  2.5 mg Oral Daily  . feeding supplement (NEPRO CARB STEADY)  237 mL Oral BID BM  . feeding supplement (PRO-STAT SUGAR FREE 64)  30 mL Oral TID  . fluticasone  1 spray Each Nare Daily  . heparin  5,000 Units Subcutaneous Q8H  . isosorbide mononitrate  90 mg Oral Q1200  . lactulose  10 g Oral Daily  . levETIRAcetam  1,500 mg Oral Q1200  . mouth rinse  15 mL Mouth Rinse q12n4p  . Melatonin  5 mg Oral QHS  . methylPREDNISolone (SOLU-MEDROL) injection  60 mg Intravenous Q24H  . metoprolol tartrate  50 mg Oral BID  . multivitamin  1 tablet Oral QHS  . mupirocin ointment  1 application Nasal BID  . oseltamivir  30 mg Oral Once  . oseltamivir  30 mg Oral Q T,Th,Sat-1800  . OXcarbazepine  150 mg Oral QHS  . pantoprazole  40 mg Oral Daily  . polyvinyl alcohol  1 drop Both Eyes TID  . senna  2 tablet Oral BID  . sevelamer carbonate  800 mg Oral TID WC  . sodium chloride flush  3 mL Intravenous Q12H  . torsemide  100 mg Oral Daily   sodium chloride, acetaminophen **OR** acetaminophen, albuterol, bisacodyl, hydrALAZINE, nitroGLYCERIN, ondansetron **OR** ondansetron (ZOFRAN) IV, sodium chloride flush  Assessment/ Plan:  52 y.o. female with  end-stage renal disease on hemodialysis, asthma, gastroparesis, diabetes mellitus type II, congestive heart failure, hypertension, endometriosis, coronary atherosclerosis, drug-eluting stent to LAD 2017  [facility]/ [address]/ TTS   1.  ESRD on HD TTS.  -Patient completed hemodialysis over the weekend no acute indication for dialysis today.  Next dialysis treatment on Tuesday.  2.  Hyperkalemia.  Serum was significantly elevated on admission.  Since then potassium  is normalized with dialysis.  3.  Secondary hyperparathyroidism.  Maintain the patient on Renvela 800 mg p.o. 3 times daily.   4.  Anemia of chronic kidney disease.  Maintain the patient on Epogen 10,000 units IV with dialysis.   LOS: 4 Briggett Tuccillo 2/18/20195:05 PM

## 2017-05-19 NOTE — Progress Notes (Signed)
[facility] [place] [facility]   PATIENT NAME: Kimberly Montgomery    MR#:  [id number]  DATE OF BIRTH:  10/30/1965  SUBJECTIVE: She is happy today, denies any complaints, eating better than yesterday.  CHIEF COMPLAINT:   Chief Complaint  Patient presents with  . Altered Mental Status  improving. tired REVIEW OF SYSTEMS:  Review of Systems  Constitutional: Negative for chills and fever.  HENT: Negative for hearing loss.   Eyes: Negative for blurred vision, double vision and photophobia.  Respiratory: Negative for cough, hemoptysis and shortness of breath.   Cardiovascular: Negative for palpitations, orthopnea and leg swelling.  Gastrointestinal: Negative for abdominal pain, diarrhea and vomiting.  Genitourinary: Negative for dysuria and urgency.  Musculoskeletal: Negative for myalgias and neck pain.  Skin: Negative for rash.  Neurological: Negative for dizziness, focal weakness, seizures, weakness and headaches.  Psychiatric/Behavioral: Negative for memory loss. The patient does not have insomnia.     DRUG ALLERGIES:   Allergies  Allergen Reactions  . Cephalosporins Anaphylaxis    Patient has tolerated meropenem.   [name] Penicillins Anaphylaxis and Other (See Comments)    Has patient had a PCN reaction causing immediate rash, facial/tongue/throat swelling, SOB or lightheadedness with hypotension: Yes Has patient had a PCN reaction causing severe rash involving mucus membranes or skin necrosis: No Has patient had a PCN reaction that required hospitalization No Has patient had a PCN reaction occurring within the last 10 years: No If all of the above answers are "NO", then may proceed with Cephalosporin use.  . Reglan [Metoclopramide] Other (See Comments)    Severe EPS (lip, head, body tremors) March 2018  . Risperdal [Risperidone] Other (See Comments)    Severe EPS (lip, head, body tremors) March 2018  . Lamictal [Lamotrigine] Other (See Comments)    Reaction:   Hallucinations  . Phenergan [Promethazine Hcl] Nausea And Vomiting    Sensitivity to medicine  . Pravastatin Other (See Comments)    Reaction:  Muscle pain   . Sulfa Antibiotics Other (See Comments)    Reaction:  Unknown    VITALS:  Blood pressure (!) 156/56, pulse 67, temperature (!) 97.4 F (36.3 C), temperature source Oral, resp. rate 18, height 5\' 6"  (1.676 m), weight 107.3 kg (236 lb 8.9 oz), last menstrual period 03/16/2015, SpO2 98 %. PHYSICAL EXAMINATION:  Constitutional: She is well-developed, well-nourished, and in no distress.  HENT:  Head: Normocephalic and atraumatic.  Eyes: Conjunctivae and EOM are normal. Pupils are equal, round, and reactive to light.  Neck: Normal range of motion. Neck supple. No tracheal deviation present. No thyromegaly present.  Cardiovascular: Normal rate, regular rhythm and normal heart sounds.  Pulmonary/Chest: Effort normal and breath sounds normal. No respiratory distress. She has no wheezes. She exhibits no tenderness.  Abdominal: Soft. Bowel sounds are normal. She exhibits no distension. There is no tenderness.  Musculoskeletal: Normal range of motion.  Neurological: She is alert. She is alert and oriented, No cranial nerve deficit.  Skin: Skin is warm and dry. No rash noted.  Psychiatric: Mood and affect normal.  LABORATORY PANEL:  Female CBC Recent Labs  Lab 05/17/17 0442  WBC 4.9  HGB 8.9*  HCT 26.5*  PLT 202   ------------------------------------------------------------------------------------------------------------------ Chemistries  Recent Labs  Lab 05/15/17 0201  05/17/17 0442  NA 133*   < > 134*  K 7.1*   < > 4.8  CL 93*   < > 92*  CO2 25   < > 27  GLUCOSE 165*   < > 127*  BUN 65*   < > 58*  CREATININE 7.35*   < > 6.92*  CALCIUM 8.8*   < > 7.9*  AST 34  --   --   ALT 13*  --   --   ALKPHOS 157*  --   --   BILITOT 0.8  --   --    < > = values in this interval not displayed.   RADIOLOGY:  No results  found. ASSESSMENT AND PLAN:  52 year old female patient with history of end-stage renal disease on dialysis, hyperlipidemia, hypertension, coronary disease, congestive heart failure admitted for lethargy and confusion.  1.Healthcare associated pneumonia: Stop all Abx, continue oxygen to keep sats more than 90%.   improving.  Likely discharge back to [facility] tomorrow.   2.Flu like syndrome: symptomatic mgmt, tamilfu, continue Tamiflu with hemodialysis. 3.Hyperkalemia:: Improved with hemodialysis. 4.End-stage renal disease on dialysis:  5.  Abd pain, N/V:  CT A-P showing no acute patho 6. Confusion: likely due to uremia, slowly improving, patient alert, awake, oriented..  8.  Myoclonic jerks: Patient is already on Klonopin, increase the dose.  #9 bedridden, up from [facility].  [facility]charge [facility] to [facility] tomorrow.  #10/ history of depression: Restarted Celexa.  Her mood is better today.   All the records are reviewed and case discussed with Care Management/Social Worker. Management plans discussed with the patient, nephro and they are in agreement.  CODE STATUS: Full Code  TOTAL TIME TAKING CARE OF THIS PATIENT: 35 minutes.   More than 50% of the time was spent in counseling/coordination of care: YES  POSSIBLE D/C IN 1 DAYS, DEPENDING ON CLINICAL CONDITION.   Delfino Lovett M.D on 05/19/2017 at 6:36 PM  Between 7am to 6pm - Pager - [phone]  After 6pm go to [email or web address] - Social research officer, government  [facility]  Office  [phone]  CC: Primary care physician; Dorothey Baseman, MD  Note: This dictation was prepared with Dragon dictation along with smaller phrase technology. Any transcriptional errors that result from this process are unintentional.

## 2017-05-19 NOTE — Progress Notes (Signed)
Pt is complaining of 8 out 10 pain. Page rpime. Awaiting callback. Will continue to monitor.

## 2017-05-20 LAB — BASIC METABOLIC PANEL
Anion gap: 12 (ref 5–15)
BUN: 76 mg/dL — AB (ref 6–20)
CALCIUM: 8.1 mg/dL — AB (ref 8.9–10.3)
CO2: 28 mmol/L (ref 22–32)
CREATININE: 7.63 mg/dL — AB (ref 0.44–1.00)
Chloride: 90 mmol/L — ABNORMAL LOW (ref 101–111)
GFR calc non Af Amer: 5 mL/min — ABNORMAL LOW (ref >60)
GFR, EST AFRICAN AMERICAN: 6 mL/min — AB (ref >60)
GLUCOSE: 177 mg/dL — AB (ref 65–99)
Potassium: 4.8 mmol/L (ref 3.5–5.1)
Sodium: 130 mmol/L — ABNORMAL LOW (ref 135–145)

## 2017-05-20 LAB — CBC
HEMATOCRIT: 26.6 % — AB (ref 35.0–47.0)
Hemoglobin: 8.8 g/dL — ABNORMAL LOW (ref 12.0–16.0)
MCH: 29.8 pg (ref 26.0–34.0)
MCHC: 32.9 g/dL (ref 32.0–36.0)
MCV: 90.6 fL (ref 80.0–100.0)
Platelets: 205 10*3/uL (ref 150–440)
RBC: 2.94 MIL/uL — ABNORMAL LOW (ref 3.80–5.20)
RDW: 14 % (ref 11.5–14.5)
WBC: 4.7 10*3/uL (ref 3.6–11.0)

## 2017-05-20 LAB — CULTURE, BLOOD (ROUTINE X 2)
CULTURE: NO GROWTH
Culture: NO GROWTH
Special Requests: ADEQUATE
Special Requests: ADEQUATE

## 2017-05-20 LAB — PROCALCITONIN: Procalcitonin: 0.94 ng/mL

## 2017-05-20 MED ORDER — CLONAZEPAM 0.5 MG PO TABS: 0.2500 mg | ORAL_TABLET | Freq: Two times a day (BID) | ORAL | 0 refills | Status: DC

## 2017-05-20 MED ORDER — LEVETIRACETAM 750 MG PO TABS: 1500.0000 mg | ORAL_TABLET | Freq: Every day | ORAL | 0 refills | Status: DC

## 2017-05-20 MED ORDER — OSELTAMIVIR PHOSPHATE 30 MG PO CAPS: 30.0000 mg | ORAL_CAPSULE | ORAL | 0 refills | Status: DC

## 2017-05-20 MED ORDER — HYDROCODONE-ACETAMINOPHEN 5-325 MG PO TABS
1.0000 | ORAL_TABLET | Freq: Four times a day (QID) | ORAL | Status: DC | PRN
  Administered 2017-05-20 – 2017-05-21 (×2): 2 via ORAL
  Filled 2017-05-20 (×2): qty 2

## 2017-05-20 MED ORDER — ISOSORBIDE MONONITRATE ER 60 MG PO TB24
90.0000 mg | ORAL_TABLET | ORAL | Status: AC
  Administered 2017-05-20: 90 mg via ORAL
  Filled 2017-05-20: qty 1

## 2017-05-20 MED ORDER — HYDROCODONE-ACETAMINOPHEN 5-325 MG PO TABS: 1.0000 | ORAL_TABLET | Freq: Four times a day (QID) | ORAL | 0 refills | Status: DC | PRN

## 2017-05-20 MED ORDER — LABETALOL HCL 5 MG/ML IV SOLN
5.0000 mg | Freq: Once | INTRAVENOUS | Status: AC
  Administered 2017-05-20: 5 mg via INTRAVENOUS
  Filled 2017-05-20: qty 4

## 2017-05-20 NOTE — Clinical Social Work Note (Addendum)
CSW spoke with [facility] where she is a long term care resident, she will be able to return back today.  Patient to be d/c'ed today to [facility] room 810.  Patient and family agreeable to plans will transport via ems RN to call report 800 hall nurse [phone].  CSW left message on patient's daughter Heather's voice mail.  Windell Moulding, MSW, [name] [phone]

## 2017-05-20 NOTE — Care Management Important Message (Signed)
Important Message  Patient Details  Name: Kimberly Montgomery MRN: [id number] Date of Birth: [birth year]   Medicare Important Message Given:  Yes    Eber Hong, RN 05/20/2017, 11:59 AM

## 2017-05-20 NOTE — Plan of Care (Signed)
  Progressing Clinical Measurements: Will remain free from infection 05/20/2017 0010 - Progressing by Myles Gip, RN Pain Managment: General experience of comfort will improve 05/20/2017 0010 - Progressing by Myles Gip, RN Skin Integrity: Risk for impaired skin integrity will decrease 05/20/2017 0010 - Progressing by Myles Gip, RN Fluid Volume: Compliance with measures to maintain balanced fluid volume will improve 05/20/2017 0010 - Progressing by Myles Gip, RN Respiratory: Ability to maintain adequate ventilation will improve 05/20/2017 0010 - Progressing by Myles Gip, RN

## 2017-05-20 NOTE — Progress Notes (Addendum)
Pt bp now 157/57. Pulse 80. After IV labetalol. Will continue to monitor and report to oncoming nurse.

## 2017-05-20 NOTE — Progress Notes (Signed)
Pt off the floor for HD.

## 2017-05-20 NOTE — Discharge Instructions (Signed)

## 2017-05-20 NOTE — Progress Notes (Deleted)
Patient's b/p now 157/57 after IV labetolol. Will reported to oncoming nurse and continue to monitor.

## 2017-05-20 NOTE — Progress Notes (Signed)
[facility]  ROUNDING NOTE   Subjective:  Patient is doing well today.  Denies any acute complaints or shortness of breath Seen at the beginning of HD Expected to be discharged today  Objective:  Vital signs in last 24 hours:  Temp:  [97.4 F (36.3 C)-98.6 F (37 C)] 97.8 F (36.6 C) (02/19 0800) Pulse Rate:  [52-68] 52 (02/19 0800) Resp:  [18-20] 20 (02/19 0800) BP: (151-180)/(48-86) 156/57 (02/19 0800) SpO2:  [93 %-99 %] 99 % (02/19 0800)  Weight change:  Filed Weights   05/17/17 0823 05/18/17 0400 05/19/17 0615  Weight: 104.5 kg (230 lb 6.1 oz) 105.3 kg (232 lb 2.3 oz) 107.3 kg (236 lb 8.9 oz)    Intake/Output: I/O last 3 completed shifts: In: 680 [P.O.:680] Out: 0    Intake/Output this shift:  No intake/output data recorded.  Physical Exam: General: No acute distress  Head: Normocephalic, atraumatic. Moist oral mucosal membranes  Eyes: Anicteric  Neck: Supple, trachea midline  Lungs:  Clear to auscultation, normal effort  Heart: S1S2 no rubs  Abdomen:  Soft, nontender, bowel sounds present  Extremities: Trace peripheral edema.  Right BKA  Neurologic: Awake, alert, following commands  Skin: No lesions   AVF    Basic Metabolic Panel: Recent Labs  Lab 05/15/17 0201 05/15/17 0624 05/16/17 0715 05/17/17 0442 05/20/17 0645  NA 133* 132* 134* 134* 130*  K 7.1* 6.7* 4.7 4.8 4.8  CL 93* 94* 92* 92* 90*  CO2 25 24 30 27 28   GLUCOSE 165* 135* 132* 127* 177*  BUN 65* 67* 36* 58* 76*  CREATININE 7.35* 7.64* 5.28* 6.92* 7.63*  CALCIUM 8.8* 8.9 8.6* 7.9* 8.1*  PHOS  --  5.1*  --   --   --     Liver Function Tests: Recent Labs  Lab 05/15/17 0201  AST 34  ALT 13*  ALKPHOS 157*  BILITOT 0.8  PROT 7.4  ALBUMIN 3.8   No results for input(s): LIPASE, AMYLASE in the last 168 hours. No results for input(s): AMMONIA in the last 168 hours.  CBC: Recent Labs  Lab 05/15/17 0201 05/15/17 0624 05/16/17 0715 05/17/17 0442 05/20/17 0645  WBC  9.6 6.9 4.0 4.9 4.7  NEUTROABS 8.9*  --   --   --   --   HGB 10.5* 10.0* 9.1* 8.9* 8.8*  HCT 32.1* 30.2* 28.1* 26.5* 26.6*  MCV 92.0 91.5 92.4 90.5 90.6  PLT 228 200 198 202 205    Cardiac Enzymes: No results for input(s): CKTOTAL, CKMB, CKMBINDEX, TROPONINI in the last 168 hours.  BNP: Invalid input(s): POCBNP  CBG: Recent Labs  Lab 05/15/17 0644 05/17/17 1036  GLUCAP 122* 88    Microbiology: Results for orders placed or performed during the hospital encounter of 05/15/17  Blood Culture (routine x 2)     Status: None   Collection Time: 05/15/17  2:01 AM  Result Value Ref Range Status   Specimen Description BLOOD LEFT HAND  Final   Special Requests   Final    BOTTLES DRAWN AEROBIC AND ANAEROBIC Blood Culture adequate volume   Culture   Final    NO GROWTH 5 DAYS Performed at [facility], [address], [place], [place] [address]    Report Status 05/20/2017 FINAL  Final  Blood Culture (routine x 2)     Status: None   Collection Time: 05/15/17  2:01 AM  Result Value Ref Range Status   Specimen Description BLOOD RIGHT FA  Final  Special Requests   Final    BOTTLES DRAWN AEROBIC AND ANAEROBIC Blood Culture adequate volume   Culture   Final    NO GROWTH 5 DAYS Performed at [facility], [address] [place], [place] [address]    Report Status 05/20/2017 FINAL  Final  MRSA PCR Screening     Status: Abnormal   Collection Time: 05/15/17  5:35 AM  Result Value Ref Range Status   MRSA by PCR POSITIVE (A) NEGATIVE Final    Comment:        The GeneXpert MRSA Assay (FDA approved for NASAL specimens only), is one component of a comprehensive MRSA colonization surveillance program. It is not intended to diagnose MRSA infection nor to guide or monitor treatment for MRSA infections. RESULT CALLED TO, READ BACK BY AND VERIFIED WITH: AMY DALTON AT 0802 ON 05/15/17 [facility]. Performed at [facility], [address] [place], [place] [address]      Coagulation Studies: No results for input(s): LABPROT, INR in the last 72 hours.  Urinalysis: No results for input(s): COLORURINE, LABSPEC, PHURINE, GLUCOSEU, HGBUR, BILIRUBINUR, KETONESUR, PROTEINUR, UROBILINOGEN, NITRITE, LEUKOCYTESUR in the last 72 hours.  Invalid input(s): APPERANCEUR    Imaging: No results found.   Medications:   . sodium chloride     . amLODipine  10 mg Oral Daily  . aspirin  81 mg Oral Daily  . atorvastatin  40 mg Oral q1800  . benztropine  0.5 mg Oral BID  . chlorhexidine  15 mL Mouth Rinse BID  . Chlorhexidine Gluconate Cloth  6 each Topical Q0600  . clonazePAM  0.5 mg Oral BID  . clopidogrel  75 mg Oral Q1200  . epoetin (EPOGEN/PROCRIT) injection  10,000 Units Intravenous Q T,Th,Sa-HD  . escitalopram  2.5 mg Oral Daily  . feeding supplement (NEPRO CARB STEADY)  237 mL Oral BID BM  . feeding supplement (PRO-STAT SUGAR FREE 64)  30 mL Oral TID  . fluticasone  1 spray Each Nare Daily  . gabapentin  300 mg Oral Daily  . heparin  5,000 Units Subcutaneous Q8H  . isosorbide mononitrate  90 mg Oral Q1200  . lactulose  10 g Oral Daily  . levETIRAcetam  1,500 mg Oral Q1200  . mouth rinse  15 mL Mouth Rinse q12n4p  . Melatonin  5 mg Oral QHS  . methylPREDNISolone (SOLU-MEDROL) injection  60 mg Intravenous Q24H  . metoprolol tartrate  50 mg Oral BID  . multivitamin  1 tablet Oral QHS  . oseltamivir  30 mg Oral Once  . oseltamivir  30 mg Oral Q T,Th,Sat-1800  . OXcarbazepine  150 mg Oral QHS  . pantoprazole  40 mg Oral Daily  . polyvinyl alcohol  1 drop Both Eyes TID  . senna  2 tablet Oral BID  . sevelamer carbonate  800 mg Oral TID WC  . sodium chloride flush  3 mL Intravenous Q12H  . torsemide  100 mg Oral Daily   sodium chloride, acetaminophen **OR** acetaminophen, albuterol, bisacodyl, hydrALAZINE, HYDROcodone-acetaminophen, nitroGLYCERIN, ondansetron **OR** ondansetron (ZOFRAN) IV, sodium chloride flush  Assessment/ Plan:  52 y.o. female  with end-stage renal disease on hemodialysis, asthma, gastroparesis, diabetes mellitus type II, congestive heart failure, hypertension, endometriosis, coronary atherosclerosis, drug-eluting stent to LAD 2017  [facility] Nephrology/ [address]/ MWF  1.  ESRD on HD MWF.  - HD today then patient will resume outpatient HD starting tomorrow  2.  Hyperkalemia.  Serum was significantly elevated on admission.  Since then  potassium is normalized with dialysis.  3.  Secondary hyperparathyroidism.  Maintain the patient on Renvela 800 mg p.o. 3 times daily.   4.  Anemia of chronic kidney disease.  Maintain the patient on Epogen 10,000 units IV with dialysis.   LOS: 5 Gowri Suchan 2/19/201911:58 AM

## 2017-05-20 NOTE — Progress Notes (Signed)
[facility] [place] [facility]   PATIENT NAME: Kimberly Montgomery    MR#:  [id number]  DATE OF BIRTH:  January 10, 1966  SUBJECTIVE: She is happy today, denies any complaints, eating better than yesterday.  CHIEF COMPLAINT:   Chief Complaint  Patient presents with  . Altered Mental Status  improving. tired REVIEW OF SYSTEMS:  Review of Systems  Constitutional: Negative for chills and fever.  HENT: Negative for hearing loss.   Eyes: Negative for blurred vision, double vision and photophobia.  Respiratory: Negative for cough, hemoptysis and shortness of breath.   Cardiovascular: Negative for palpitations, orthopnea and leg swelling.  Gastrointestinal: Negative for abdominal pain, diarrhea and vomiting.  Genitourinary: Negative for dysuria and urgency.  Musculoskeletal: Negative for myalgias and neck pain.  Skin: Negative for rash.  Neurological: Negative for dizziness, focal weakness, seizures, weakness and headaches.  Psychiatric/Behavioral: Negative for memory loss. The patient does not have insomnia.     DRUG ALLERGIES:   Allergies  Allergen Reactions  . Cephalosporins Anaphylaxis    Patient has tolerated meropenem.   [name] Penicillins Anaphylaxis and Other (See Comments)    Has patient had a PCN reaction causing immediate rash, facial/tongue/throat swelling, SOB or lightheadedness with hypotension: Yes Has patient had a PCN reaction causing severe rash involving mucus membranes or skin necrosis: No Has patient had a PCN reaction that required hospitalization No Has patient had a PCN reaction occurring within the last 10 years: No If all of the above answers are "NO", then may proceed with Cephalosporin use.  . Reglan [Metoclopramide] Other (See Comments)    Severe EPS (lip, head, body tremors) March 2018  . Risperdal [Risperidone] Other (See Comments)    Severe EPS (lip, head, body tremors) March 2018  . Lamictal [Lamotrigine] Other (See Comments)    Reaction:   Hallucinations  . Phenergan [Promethazine Hcl] Nausea And Vomiting    Sensitivity to medicine  . Pravastatin Other (See Comments)    Reaction:  Muscle pain   . Sulfa Antibiotics Other (See Comments)    Reaction:  Unknown    VITALS:  Blood pressure (!) 161/59, pulse 79, temperature 98.3 F (36.8 C), resp. rate 18, height 5\' 6"  (1.676 m), weight 109.8 kg (242 lb 1 oz), last menstrual period 03/16/2015, SpO2 91 %. PHYSICAL EXAMINATION:  Constitutional: She is well-developed, well-nourished, and in no distress.  HENT:  Head: Normocephalic and atraumatic.  Eyes: Conjunctivae and EOM are normal. Pupils are equal, round, and reactive to light.  Neck: Normal range of motion. Neck supple. No tracheal deviation present. No thyromegaly present.  Cardiovascular: Normal rate, regular rhythm and normal heart sounds.  Pulmonary/Chest: Effort normal and breath sounds normal. No respiratory distress. She has no wheezes. She exhibits no tenderness.  Abdominal: Soft. Bowel sounds are normal. She exhibits no distension. There is no tenderness.  Musculoskeletal: Normal range of motion.  Neurological: She is alert. She is alert and oriented, No cranial nerve deficit.  Skin: Skin is warm and dry. No rash noted.  Psychiatric: Mood and affect normal.  LABORATORY PANEL:  Female CBC Recent Labs  Lab 05/20/17 0645  WBC 4.7  HGB 8.8*  HCT 26.6*  PLT 205   ------------------------------------------------------------------------------------------------------------------ Chemistries  Recent Labs  Lab 05/15/17 0201  05/20/17 0645  NA 133*   < > 130*  K 7.1*   < > 4.8  CL 93*   < > 90*  CO2 25   < > 28  GLUCOSE 165*   < >  177*  BUN 65*   < > 76*  CREATININE 7.35*   < > 7.63*  CALCIUM 8.8*   < > 8.1*  AST 34  --   --   ALT 13*  --   --   ALKPHOS 157*  --   --   BILITOT 0.8  --   --    < > = values in this interval not displayed.   RADIOLOGY:  No results found. ASSESSMENT AND PLAN:  52 year old  female patient with history of end-stage renal disease on dialysis, hyperlipidemia, hypertension, coronary disease, congestive heart failure admitted for lethargy and confusion.  * Malignant HTN - plan for D/C today but post dialysis BP in 190-200 requiring hydralazine, labetolol, imdur as she was symptomatic with dizziness and headache. - canceled the DC  1.Healthcare associated pneumonia: treated with Abx initially. Check procalcitonin to decide need for Abx at D/C. continue oxygen to keep sats more than 90%.   improving.  Likely discharge back to [facility] tomorrow.   2.Flu like syndrome: symptomatic mgmt, tamilfu, continue Tamiflu with hemodialysis. 3.Hyperkalemia:: Improved with hemodialysis. 4.End-stage renal disease on dialysis:  5.  Abd pain, N/V:  CT A-P showing no acute patho 6. Confusion: likely due to uremia, slowly improving, patient alert, awake, oriented..  8.  Myoclonic jerks: Patient is already on Klonopin, increase the dose.  #9 bedridden, up from [facility].  [facility].  #10/ history of depression: Restarted Celexa.  Her mood is better today.   All the records are reviewed and case discussed with Care Management/Social Worker. Management plans discussed with the patient, nephro and they are in agreement.  CODE STATUS: Full Code  TOTAL TIME TAKING CARE OF THIS PATIENT: 35 minutes.   More than 50% of the time was spent in counseling/coordination of care: YES  POSSIBLE D/C IN 1 DAYS, DEPENDING ON CLINICAL CONDITION.   Delfino Lovett M.D on 05/20/2017 at 8:42 PM  Between 7am to 6pm - Pager - [phone]  After 6pm go to [email or web address] - Social research officer, government  [facility]  Office  [phone]  CC: Primary care physician; Dorothey Baseman, MD  Note: This dictation was prepared with Dragon dictation along with smaller phrase technology. Any transcriptional errors that result from this process are  unintentional.

## 2017-05-20 NOTE — Progress Notes (Signed)
Pt back from HD. bp systolic 199/64, daily meds given. BpIV hydralazine given. Dr. Sherryll Burger notified. New orders for Imdur 90 mg. Will administer and continue to monitor.

## 2017-05-20 NOTE — Progress Notes (Signed)
Pt's BP still 198/62 after IV hydralazine. Pt complaining of dizziness, headache.MD Sherryll Burger aware. Orders for 5 mg IV labetalol and cancel discharge. Will administer and continue to monitor.

## 2017-05-20 NOTE — Discharge Summary (Addendum)
[facility] - [place] at [facility]   PATIENT NAME: Kimberly Montgomery    MR#:  [id number]  DATE OF BIRTH:  [birth year]  DATE OF ADMISSION:  05/15/2017   ADMITTING PHYSICIAN: Ihor Austin, MD  DATE OF DISCHARGE: 05/21/2017  PRIMARY CARE PHYSICIAN: Dorothey Baseman, MD   ADMISSION DIAGNOSIS:  Hyperkalemia [E87.5] Lower urinary tract infectious disease [N39.0] ESRD (end stage renal disease) on dialysis (HCC) [N18.6, Z99.2] Influenza [J11.1] Altered mental status, unspecified altered mental status type [R41.82] Pneumonia due to infectious organism, unspecified laterality, unspecified part of lung [J18.9] DISCHARGE DIAGNOSIS:  Active Problems:   Pneumonia  SECONDARY DIAGNOSIS:   Past Medical History:  Diagnosis Date  . Anginal pain (HCC)   . Anxiety   . Bipolar disorder (HCC)   . CAD (coronary artery disease)   . CHF (congestive heart failure) (HCC)   . Chronic lower back pain   . Depression   . Endometriosis   . ESRD (end stage renal disease) on dialysis (HCC)    "[facility]; [address]; [place]; TTS" (01/19/2016)  . Gastroparesis   . GERD (gastroesophageal reflux disease)   . History of blood transfusion "several"   "my blood would get low; low RBC"  . History of hiatal hernia   . HLD (hyperlipidemia)   . Hypertension   . Migraine    "monthly" (01/19/2016)  . Myocardial infarction (HCC) 2017   "~ 3 wks ago" (01/19/2016)  . Renal disorder   . Renal insufficiency   . Seizures (HCC) 07/2015   "I've only had the 1; don't know what from" (01/19/2016)  . Type II diabetes mellitus [facility])    HOSPITAL COURSE:  52 year old female patient with history of end-stage renal disease on dialysis, hyperlipidemia, hypertension, coronary disease, congestive heart failureadmitted forlethargy and confusion.  1.Healthcare associated pneumonia: treated. This is likely due to influenza and non-bacterial 2.Flulikesyndrome: symptomatic mgmt, tamilfu, continue  Tamiflu with hemodialysis. 3.Hyperkalemia:: Improved with hemodialysis. 4.End-stage renal disease on dialysis:  5. Abd pain, N/V:  CT A-P showing no acute patho, now resolved 6. Confusion: likely due to uremia, resolved, patient alert, awake, oriented. 7.  Myoclonic jerks: now resolved. Reduce dose of klo 8. [facility]: [facility]  [facility]. history of depression: Restarted Celexa.  Her mood is better today. DISCHARGE CONDITIONS:  stable CONSULTS OBTAINED:  Treatment Team:  Mady Haagensen, MD DRUG ALLERGIES:   Allergies  Allergen Reactions  . Cephalosporins Anaphylaxis    Patient has tolerated meropenem.   [name] Penicillins Anaphylaxis and Other (See Comments)    Has patient had a PCN reaction causing immediate rash, facial/tongue/throat swelling, SOB or lightheadedness with hypotension: Yes Has patient had a PCN reaction causing severe rash involving mucus membranes or skin necrosis: No Has patient had a PCN reaction that required hospitalization No Has patient had a PCN reaction occurring within the last 10 years: No If all of the above answers are "NO", then may proceed with Cephalosporin use.  . Reglan [Metoclopramide] Other (See Comments)    Severe EPS (lip, head, body tremors) March 2018  . Risperdal [Risperidone] Other (See Comments)    Severe EPS (lip, head, body tremors) March 2018  . Lamictal [Lamotrigine] Other (See Comments)    Reaction:  Hallucinations  . Phenergan [Promethazine Hcl] Nausea And Vomiting    Sensitivity to medicine  . Pravastatin Other (See Comments)    Reaction:  Muscle pain   . Sulfa Antibiotics Other (See Comments)    Reaction:  Unknown  DISCHARGE MEDICATIONS:   Allergies as of 05/20/2017      Reactions   Cephalosporins Anaphylaxis   Patient has tolerated meropenem.    Penicillins Anaphylaxis, Other (See Comments)   Has patient had a PCN reaction causing immediate rash, facial/tongue/throat swelling, SOB or  lightheadedness with hypotension: Yes Has patient had a PCN reaction causing severe rash involving mucus membranes or skin necrosis: No Has patient had a PCN reaction that required hospitalization No Has patient had a PCN reaction occurring within the last 10 years: No If all of the above answers are "NO", then may proceed with Cephalosporin use.   Reglan [metoclopramide] Other (See Comments)   Severe EPS (lip, head, body tremors) March 2018   Risperdal [risperidone] Other (See Comments)   Severe EPS (lip, head, body tremors) March 2018   Lamictal [lamotrigine] Other (See Comments)   Reaction:  Hallucinations   Phenergan [promethazine Hcl] Nausea And Vomiting   Sensitivity to medicine   Pravastatin Other (See Comments)   Reaction:  Muscle pain    Sulfa Antibiotics Other (See Comments)   Reaction:  Unknown       Medication List    TAKE these medications   acetaminophen 500 MG tablet Commonly known as:  TYLENOL Take 500 mg by mouth every 6 (six) hours as needed for moderate pain or fever.   amLODipine 10 MG tablet Commonly known as:  NORVASC Take 10 mg by mouth daily.   aspirin 81 MG chewable tablet Chew 81 mg by mouth daily.   atorvastatin 40 MG tablet Commonly known as:  LIPITOR Take 40 mg by mouth daily at 6 PM.   benztropine 0.5 MG tablet Commonly known as:  COGENTIN Take 0.5 mg by mouth 2 (two) times daily.   bisacodyl 10 MG suppository Commonly known as:  DULCOLAX Place 10 mg rectally as needed for moderate constipation.   clonazePAM 0.5 MG tablet Commonly known as:  KLONOPIN Take 0.5 tablets (0.25 mg total) by mouth 2 (two) times daily. What changed:    how much to take  additional instructions   clopidogrel 75 MG tablet Commonly known as:  PLAVIX Take 75 mg by mouth daily at 12 noon.   escitalopram 5 MG tablet Commonly known as:  LEXAPRO Take 2.5 mg by mouth daily.   fluticasone 50 MCG/ACT nasal spray Commonly known as:  FLONASE Place 1 spray into  both nostrils daily.   gabapentin 300 MG capsule Commonly known as:  NEURONTIN Take 300 mg by mouth daily.   hydrALAZINE 50 MG tablet Commonly known as:  APRESOLINE Take 50 mg by mouth 2 (two) times daily. On Monday, Wednesday, and Friday What changed:  Another medication with the same name was changed. Make sure you understand how and when to take each.   hydrALAZINE 50 MG tablet Commonly known as:  APRESOLINE Take 1 tablet (50 mg total) by mouth 3 (three) times daily. What changed:    how much to take  additional instructions   HYDROcodone-acetaminophen 5-325 MG tablet Commonly known as:  NORCO/VICODIN Take 1 tablet by mouth every 6 (six) hours as needed for moderate pain or severe pain. What changed:  how much to take   insulin aspart 100 UNIT/ML injection Commonly known as:  novoLOG Inject 7 Units into the skin 3 (three) times daily with meals.   isosorbide mononitrate 30 MG 24 hr tablet Commonly known as:  IMDUR Take 90 mg by mouth daily at 12 noon.   lactulose 10 GM/15ML solution Commonly known  as:  CHRONULAC Take 10 g by mouth daily at 6 PM.   LEVEMIR 100 UNIT/ML injection Generic drug:  insulin detemir Inject 4 Units into the skin daily.   levETIRAcetam 750 MG tablet Commonly known as:  KEPPRA Take 2 tablets (1,500 mg total) by mouth daily at 12 noon.   lidocaine 5 % Commonly known as:  LIDODERM Place 1 patch onto the skin at bedtime.   lidocaine 4 % cream Commonly known as:  LMX Apply 1 application topically daily as needed (pain). To right arm fistula   linagliptin 5 MG Tabs tablet Commonly known as:  TRADJENTA Take 5 mg by mouth daily.   lisinopril 10 MG tablet Commonly known as:  PRINIVIL,ZESTRIL Take 10 mg by mouth daily at 12 noon.   Melatonin 3 MG Tabs Take 3 mg by mouth at bedtime.   metoprolol tartrate 50 MG tablet Commonly known as:  LOPRESSOR Take 1 tablet (50 mg total) by mouth 2 (two) times daily.   multivitamin Tabs  tablet Take 1 tablet by mouth daily.   nitroGLYCERIN 0.4 MG SL tablet Commonly known as:  NITROSTAT Place 0.4 mg under the tongue every 5 (five) minutes as needed for chest pain. Reported on 07/10/2015   omeprazole 20 MG capsule Commonly known as:  PRILOSEC Take 20 mg by mouth 2 (two) times daily.   ondansetron 4 MG disintegrating tablet Commonly known as:  ZOFRAN ODT Take 1 tablet (4 mg total) by mouth every 6 (six) hours as needed for nausea or vomiting. What changed:  when to take this   oseltamivir 30 MG capsule Commonly known as:  TAMIFLU Take 1 capsule (30 mg total) by mouth every Tuesday, Thursday, and Saturday at 6 PM.   OXcarbazepine 150 MG tablet Commonly known as:  TRILEPTAL Take 150 mg by mouth at bedtime.   polyethylene glycol packet Commonly known as:  MIRALAX / GLYCOLAX Take 17 g by mouth daily. What changed:  when to take this   REFRESH 1 % ophthalmic solution Generic drug:  carboxymethylcellulose Place 1 drop into both eyes 3 (three) times daily.   senna 8.6 MG Tabs tablet Commonly known as:  SENOKOT Take 2 tablets by mouth 2 (two) times daily.   sevelamer carbonate 800 MG tablet Commonly known as:  RENVELA Take 800 mg by mouth 4 (four) times daily -  before meals and at bedtime.   torsemide 100 MG tablet Commonly known as:  DEMADEX Take 100 mg by mouth daily.      DISCHARGE INSTRUCTIONS:   DIET:  Renal diet DISCHARGE CONDITION:  Good ACTIVITY:  Activity as tolerated OXYGEN:  Home Oxygen: No.  Oxygen Delivery: room air DISCHARGE LOCATION:  [facility] - Long term resident  If you experience worsening of your admission symptoms, develop shortness of breath, life threatening emergency, suicidal or homicidal thoughts you must seek medical attention immediately by calling 911 or calling your MD immediately  if symptoms less severe.  You Must read complete instructions/literature along with all the possible adverse reactions/side effects  for all the Medicines you take and that have been prescribed to you. Take any new Medicines after you have completely understood and accpet all the possible adverse reactions/side effects.   Please note  You were cared for by a hospitalist during your hospital stay. If you have any questions about your discharge medications or the care you received while you were in the hospital after you are discharged, you can call the unit and asked to speak  with the hospitalist on call if the hospitalist that took care of you is not available. Once you are discharged, your primary care physician will handle any further medical issues. Please note that NO REFILLS for any discharge medications will be authorized once you are discharged, as it is imperative that you return to your primary care physician (or establish a relationship with a primary care physician if you do not have one) for your aftercare needs so that they can reassess your need for medications and monitor your lab values.    On the day of Discharge:  VITAL SIGNS:  Blood pressure (!) 171/68, pulse 65, temperature 98.2 F (36.8 C), temperature source Oral, resp. rate 16, height 5\' 6"  (1.676 m), weight 109.8 kg (242 lb 1 oz), last menstrual period 03/16/2015, SpO2 100 %. PHYSICAL EXAMINATION:  GENERAL:  52 y.o.-year-old patient lying in the bed with no acute distress.  EYES: Pupils equal, round, reactive to light and accommodation. No scleral icterus. Extraocular muscles intact.  HEENT: Head atraumatic, normocephalic. Oropharynx and nasopharynx clear.  NECK:  Supple, no jugular venous distention. No thyroid enlargement, no tenderness.  LUNGS: Normal breath sounds bilaterally, no wheezing, rales,rhonchi or crepitation. No use of accessory muscles of respiration.  CARDIOVASCULAR: S1, S2 normal. No murmurs, rubs, or gallops.  ABDOMEN: Soft, non-tender, non-distended. Bowel sounds present. No organomegaly or mass.  EXTREMITIES: No pedal edema,  cyanosis, or clubbing.  NEUROLOGIC: Cranial nerves II through XII are intact. Muscle strength 5/5 in all extremities. Sensation intact. Gait not checked.  PSYCHIATRIC: The patient is alert and oriented x 3.  SKIN: No obvious rash, lesion, or ulcer.  DATA REVIEW:   CBC Recent Labs  Lab 05/20/17 0645  WBC 4.7  HGB 8.8*  HCT 26.6*  PLT 205    Chemistries  Recent Labs  Lab 05/15/17 0201  05/20/17 0645  NA 133*   < > 130*  K 7.1*   < > 4.8  CL 93*   < > 90*  CO2 25   < > 28  GLUCOSE 165*   < > 177*  BUN 65*   < > 76*  CREATININE 7.35*   < > 7.63*  CALCIUM 8.8*   < > 8.1*  AST 34  --   --   ALT 13*  --   --   ALKPHOS 157*  --   --   BILITOT 0.8  --   --    < > = values in this interval not displayed.     Contact information for follow-up providers    Dorothey Baseman, MD. Schedule an appointment as soon as possible for a visit in 1 week(s).   Specialty:  Family Medicine Contact information: [address] [place] [address] [phone]            Contact information for after-discharge care    Destination    [facility] Follow up.   Service:  Skilled Nursing Contact information: [address] [place] [address] [phone]                  Management plans discussed with the patient, Nursing and they are in agreement.  CODE STATUS: Full Code   TOTAL TIME TAKING CARE OF THIS PATIENT: 45 minutes.    Delfino Lovett M.D on 05/20/2017 at 1:36 PM  Between 7am to 6pm - Pager - [phone]  After 6pm go to [email or web address] - Therapist, nutritional Hospitalists  Office  [phone]  CC: Primary care physician; Dorothey Baseman, MD   Note: This dictation was prepared with Dragon dictation along with smaller phrase technology. Any transcriptional errors that result from this process are unintentional.

## 2017-05-21 NOTE — Progress Notes (Signed)
Kimberly Montgomery to be D/C'd Skilled nursing facility per MD order.  Discussed prescriptions and follow up appointments with the patient. Prescriptions given to patient, medication list explained in detail. Pt verbalized understanding.  Allergies as of 05/21/2017      Reactions   Cephalosporins Anaphylaxis   Patient has tolerated meropenem.    Penicillins Anaphylaxis, Other (See Comments)   Has patient had a PCN reaction causing immediate rash, facial/tongue/throat swelling, SOB or lightheadedness with hypotension: Yes Has patient had a PCN reaction causing severe rash involving mucus membranes or skin necrosis: No Has patient had a PCN reaction that required hospitalization No Has patient had a PCN reaction occurring within the last 10 years: No If all of the above answers are "NO", then may proceed with Cephalosporin use.   Reglan [metoclopramide] Other (See Comments)   Severe EPS (lip, head, body tremors) March 2018   Risperdal [risperidone] Other (See Comments)   Severe EPS (lip, head, body tremors) March 2018   Lamictal [lamotrigine] Other (See Comments)   Reaction:  Hallucinations   Phenergan [promethazine Hcl] Nausea And Vomiting   Sensitivity to medicine   Pravastatin Other (See Comments)   Reaction:  Muscle pain    Sulfa Antibiotics Other (See Comments)   Reaction:  Unknown       Medication List    TAKE these medications   acetaminophen 500 MG tablet Commonly known as:  TYLENOL Take 500 mg by mouth every 6 (six) hours as needed for moderate pain or fever.   amLODipine 10 MG tablet Commonly known as:  NORVASC Take 10 mg by mouth daily.   aspirin 81 MG chewable tablet Chew 81 mg by mouth daily.   atorvastatin 40 MG tablet Commonly known as:  LIPITOR Take 40 mg by mouth daily at 6 PM.   benztropine 0.5 MG tablet Commonly known as:  COGENTIN Take 0.5 mg by mouth 2 (two) times daily.   bisacodyl 10 MG suppository Commonly known as:  DULCOLAX Place 10 mg rectally as  needed for moderate constipation.   clonazePAM 0.5 MG tablet Commonly known as:  KLONOPIN Take 0.5 tablets (0.25 mg total) by mouth 2 (two) times daily. What changed:    how much to take  additional instructions   clopidogrel 75 MG tablet Commonly known as:  PLAVIX Take 75 mg by mouth daily at 12 noon.   escitalopram 5 MG tablet Commonly known as:  LEXAPRO Take 2.5 mg by mouth daily.   fluticasone 50 MCG/ACT nasal spray Commonly known as:  FLONASE Place 1 spray into both nostrils daily.   gabapentin 300 MG capsule Commonly known as:  NEURONTIN Take 300 mg by mouth daily.   hydrALAZINE 50 MG tablet Commonly known as:  APRESOLINE Take 50 mg by mouth 2 (two) times daily. On Monday, Wednesday, and Friday What changed:  Another medication with the same name was changed. Make sure you understand how and when to take each.   hydrALAZINE 50 MG tablet Commonly known as:  APRESOLINE Take 1 tablet (50 mg total) by mouth 3 (three) times daily. What changed:    how much to take  additional instructions   HYDROcodone-acetaminophen 5-325 MG tablet Commonly known as:  NORCO/VICODIN Take 1 tablet by mouth every 6 (six) hours as needed for moderate pain or severe pain. What changed:  how much to take   insulin aspart 100 UNIT/ML injection Commonly known as:  novoLOG Inject 7 Units into the skin 3 (three) times daily with meals.  isosorbide mononitrate 30 MG 24 hr tablet Commonly known as:  IMDUR Take 90 mg by mouth daily at 12 noon.   lactulose 10 GM/15ML solution Commonly known as:  CHRONULAC Take 10 g by mouth daily at 6 PM.   LEVEMIR 100 UNIT/ML injection Generic drug:  insulin detemir Inject 4 Units into the skin daily.   levETIRAcetam 750 MG tablet Commonly known as:  KEPPRA Take 2 tablets (1,500 mg total) by mouth daily at 12 noon.   lidocaine 5 % Commonly known as:  LIDODERM Place 1 patch onto the skin at bedtime.   lidocaine 4 % cream Commonly known as:   LMX Apply 1 application topically daily as needed (pain). To right arm fistula   linagliptin 5 MG Tabs tablet Commonly known as:  TRADJENTA Take 5 mg by mouth daily.   lisinopril 10 MG tablet Commonly known as:  PRINIVIL,ZESTRIL Take 10 mg by mouth daily at 12 noon.   Melatonin 3 MG Tabs Take 3 mg by mouth at bedtime.   metoprolol tartrate 50 MG tablet Commonly known as:  LOPRESSOR Take 1 tablet (50 mg total) by mouth 2 (two) times daily.   multivitamin Tabs tablet Take 1 tablet by mouth daily.   nitroGLYCERIN 0.4 MG SL tablet Commonly known as:  NITROSTAT Place 0.4 mg under the tongue every 5 (five) minutes as needed for chest pain. Reported on 07/10/2015   omeprazole 20 MG capsule Commonly known as:  PRILOSEC Take 20 mg by mouth 2 (two) times daily.   ondansetron 4 MG disintegrating tablet Commonly known as:  ZOFRAN ODT Take 1 tablet (4 mg total) by mouth every 6 (six) hours as needed for nausea or vomiting. What changed:  when to take this   oseltamivir 30 MG capsule Commonly known as:  TAMIFLU Take 1 capsule (30 mg total) by mouth every Tuesday, Thursday, and Saturday at 6 PM.   OXcarbazepine 150 MG tablet Commonly known as:  TRILEPTAL Take 150 mg by mouth at bedtime.   polyethylene glycol packet Commonly known as:  MIRALAX / GLYCOLAX Take 17 g by mouth daily. What changed:  when to take this   REFRESH 1 % ophthalmic solution Generic drug:  carboxymethylcellulose Place 1 drop into both eyes 3 (three) times daily.   senna 8.6 MG Tabs tablet Commonly known as:  SENOKOT Take 2 tablets by mouth 2 (two) times daily.   sevelamer carbonate 800 MG tablet Commonly known as:  RENVELA Take 800 mg by mouth 4 (four) times daily -  before meals and at bedtime.   torsemide 100 MG tablet Commonly known as:  DEMADEX Take 100 mg by mouth daily.       Vitals:   05/21/17 0803 05/21/17 0954  BP: (!) 173/60 (!) 162/57  Pulse: (!) 57 (!) 59  Resp: 18 18  Temp: 98.5  F (36.9 C)   SpO2: 95% 96%    Tele box removed and returned.Skin clean, dry and intact without evidence of skin break down, no evidence of skin tears noted. IV catheter discontinued intact. Site without signs and symptoms of complications. Dressing and pressure applied. Pt denies pain at this time. No complaints noted.  An After Visit Summary was printed and given to the patient. Patient escorted via EMS, and D/C to [facility].  Kimberly Montgomery

## 2017-05-21 NOTE — Progress Notes (Signed)
Report called to Selena Batten at [facility], Awaiting for transportation.[facility] has [facility] called.

## 2017-05-21 NOTE — Clinical Social Work Note (Signed)
Patient to be d/c'ed today to [facility].  Patient and family agreeable to plans will transport via ems RN to call report.  Windell Moulding, MSW, [name] [phone]

## 2017-05-21 NOTE — Plan of Care (Signed)
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

## 2017-07-01 ENCOUNTER — Emergency Department: Payer: Medicare HMO

## 2017-07-01 ENCOUNTER — Inpatient Hospital Stay
Admission: EM | Admit: 2017-07-01 | Discharge: 2017-07-03 | DRG: 640 | Disposition: A | Discharge status: Skilled Nursing Facility | Payer: Medicare HMO | Attending: Internal Medicine | Admitting: Internal Medicine

## 2017-07-01 ENCOUNTER — Other Ambulatory Visit: Payer: Self-pay

## 2017-07-01 DIAGNOSIS — I509 Heart failure, unspecified: Secondary | ICD-10-CM | POA: Diagnosis present

## 2017-07-01 DIAGNOSIS — Z881 Allergy status to other antibiotic agents status: Secondary | ICD-10-CM

## 2017-07-01 DIAGNOSIS — I252 Old myocardial infarction: Secondary | ICD-10-CM | POA: Diagnosis not present

## 2017-07-01 DIAGNOSIS — Z87891 Personal history of nicotine dependence: Secondary | ICD-10-CM

## 2017-07-01 DIAGNOSIS — E872 Acidosis: Secondary | ICD-10-CM | POA: Diagnosis present

## 2017-07-01 DIAGNOSIS — E785 Hyperlipidemia, unspecified: Secondary | ICD-10-CM | POA: Diagnosis present

## 2017-07-01 DIAGNOSIS — Z955 Presence of coronary angioplasty implant and graft: Secondary | ICD-10-CM | POA: Diagnosis not present

## 2017-07-01 DIAGNOSIS — Z888 Allergy status to other drugs, medicaments and biological substances status: Secondary | ICD-10-CM

## 2017-07-01 DIAGNOSIS — E1122 Type 2 diabetes mellitus with diabetic chronic kidney disease: Secondary | ICD-10-CM | POA: Diagnosis present

## 2017-07-01 DIAGNOSIS — F319 Bipolar disorder, unspecified: Secondary | ICD-10-CM | POA: Diagnosis present

## 2017-07-01 DIAGNOSIS — M791 Myalgia, unspecified site: Secondary | ICD-10-CM | POA: Diagnosis present

## 2017-07-01 DIAGNOSIS — N186 End stage renal disease: Secondary | ICD-10-CM | POA: Diagnosis present

## 2017-07-01 DIAGNOSIS — N2581 Secondary hyperparathyroidism of renal origin: Secondary | ICD-10-CM | POA: Diagnosis present

## 2017-07-01 DIAGNOSIS — K219 Gastro-esophageal reflux disease without esophagitis: Secondary | ICD-10-CM | POA: Diagnosis present

## 2017-07-01 DIAGNOSIS — E1143 Type 2 diabetes mellitus with diabetic autonomic (poly)neuropathy: Secondary | ICD-10-CM | POA: Diagnosis present

## 2017-07-01 DIAGNOSIS — Z8049 Family history of malignant neoplasm of other genital organs: Secondary | ICD-10-CM

## 2017-07-01 DIAGNOSIS — Z8673 Personal history of transient ischemic attack (TIA), and cerebral infarction without residual deficits: Secondary | ICD-10-CM

## 2017-07-01 DIAGNOSIS — Z992 Dependence on renal dialysis: Secondary | ICD-10-CM

## 2017-07-01 DIAGNOSIS — Z880 Allergy status to penicillin: Secondary | ICD-10-CM

## 2017-07-01 DIAGNOSIS — K3184 Gastroparesis: Secondary | ICD-10-CM | POA: Diagnosis present

## 2017-07-01 DIAGNOSIS — F419 Anxiety disorder, unspecified: Secondary | ICD-10-CM | POA: Diagnosis present

## 2017-07-01 DIAGNOSIS — Z794 Long term (current) use of insulin: Secondary | ICD-10-CM

## 2017-07-01 DIAGNOSIS — D631 Anemia in chronic kidney disease: Secondary | ICD-10-CM | POA: Diagnosis present

## 2017-07-01 DIAGNOSIS — I132 Hypertensive heart and chronic kidney disease with heart failure and with stage 5 chronic kidney disease, or end stage renal disease: Secondary | ICD-10-CM | POA: Diagnosis present

## 2017-07-01 DIAGNOSIS — E871 Hypo-osmolality and hyponatremia: Secondary | ICD-10-CM | POA: Diagnosis present

## 2017-07-01 DIAGNOSIS — Z9115 Patient's noncompliance with renal dialysis: Secondary | ICD-10-CM

## 2017-07-01 DIAGNOSIS — J449 Chronic obstructive pulmonary disease, unspecified: Secondary | ICD-10-CM | POA: Diagnosis present

## 2017-07-01 DIAGNOSIS — E875 Hyperkalemia: Secondary | ICD-10-CM | POA: Diagnosis present

## 2017-07-01 DIAGNOSIS — I953 Hypotension of hemodialysis: Secondary | ICD-10-CM | POA: Diagnosis present

## 2017-07-01 DIAGNOSIS — Z7982 Long term (current) use of aspirin: Secondary | ICD-10-CM

## 2017-07-01 DIAGNOSIS — Z7902 Long term (current) use of antithrombotics/antiplatelets: Secondary | ICD-10-CM

## 2017-07-01 DIAGNOSIS — I2510 Atherosclerotic heart disease of native coronary artery without angina pectoris: Secondary | ICD-10-CM | POA: Diagnosis present

## 2017-07-01 HISTORY — DX: Cerebral infarction, unspecified: I63.9

## 2017-07-01 LAB — CBC
HCT: 33.4 % — ABNORMAL LOW (ref 35.0–47.0)
HEMOGLOBIN: 10.8 g/dL — AB (ref 12.0–16.0)
MCH: 29.1 pg (ref 26.0–34.0)
MCHC: 32.3 g/dL (ref 32.0–36.0)
MCV: 90.3 fL (ref 80.0–100.0)
Platelets: 255 10*3/uL (ref 150–440)
RBC: 3.7 MIL/uL — AB (ref 3.80–5.20)
RDW: 15.4 % — ABNORMAL HIGH (ref 11.5–14.5)
WBC: 7.7 10*3/uL (ref 3.6–11.0)

## 2017-07-01 LAB — URINALYSIS, COMPLETE (UACMP) WITH MICROSCOPIC
BACTERIA UA: NONE SEEN
BILIRUBIN URINE: NEGATIVE
GLUCOSE, UA: NEGATIVE mg/dL
HGB URINE DIPSTICK: NEGATIVE
KETONES UR: NEGATIVE mg/dL
LEUKOCYTES UA: NEGATIVE
NITRITE: NEGATIVE
PH: 7 (ref 5.0–8.0)
Protein, ur: >=300 mg/dL — AB
Specific Gravity, Urine: 1.013 (ref 1.005–1.030)
Squamous Epithelial / LPF: NONE SEEN

## 2017-07-01 LAB — COMPREHENSIVE METABOLIC PANEL
ALT: 7 U/L — AB (ref 14–54)
AST: 17 U/L (ref 15–41)
Albumin: 3.6 g/dL (ref 3.5–5.0)
Alkaline Phosphatase: 150 U/L — ABNORMAL HIGH (ref 38–126)
Anion gap: 12 (ref 5–15)
BUN: 53 mg/dL — ABNORMAL HIGH (ref 6–20)
CHLORIDE: 91 mmol/L — AB (ref 101–111)
CO2: 25 mmol/L (ref 22–32)
CREATININE: 7.74 mg/dL — AB (ref 0.44–1.00)
Calcium: 8.9 mg/dL (ref 8.9–10.3)
GFR calc non Af Amer: 5 mL/min — ABNORMAL LOW (ref >60)
GFR, EST AFRICAN AMERICAN: 6 mL/min — AB (ref >60)
Glucose, Bld: 115 mg/dL — ABNORMAL HIGH (ref 65–99)
POTASSIUM: 7.4 mmol/L — AB (ref 3.5–5.1)
SODIUM: 128 mmol/L — AB (ref 135–145)
Total Bilirubin: 0.7 mg/dL (ref 0.3–1.2)
Total Protein: 7.3 g/dL (ref 6.5–8.1)

## 2017-07-01 LAB — GLUCOSE, CAPILLARY
GLUCOSE-CAPILLARY: 86 mg/dL (ref 65–99)
GLUCOSE-CAPILLARY: 97 mg/dL (ref 65–99)
Glucose-Capillary: 101 mg/dL — ABNORMAL HIGH (ref 65–99)
Glucose-Capillary: 71 mg/dL (ref 65–99)

## 2017-07-01 LAB — INFLUENZA PANEL BY PCR (TYPE A & B)
INFLBPCR: NEGATIVE
Influenza A By PCR: NEGATIVE

## 2017-07-01 LAB — MRSA PCR SCREENING: MRSA by PCR: NEGATIVE

## 2017-07-01 LAB — POTASSIUM: Potassium: 3.7 mmol/L (ref 3.5–5.1)

## 2017-07-01 MED ORDER — OXCARBAZEPINE 150 MG PO TABS
150.0000 mg | ORAL_TABLET | Freq: Every day | ORAL | Status: DC
  Administered 2017-07-01 – 2017-07-02 (×2): 150 mg via ORAL
  Filled 2017-07-01 (×2): qty 1

## 2017-07-01 MED ORDER — METOPROLOL TARTRATE 50 MG PO TABS
50.0000 mg | ORAL_TABLET | Freq: Two times a day (BID) | ORAL | Status: DC
  Administered 2017-07-02 (×2): 50 mg via ORAL
  Filled 2017-07-01 (×2): qty 1

## 2017-07-01 MED ORDER — INSULIN ASPART 100 UNIT/ML ~~LOC~~ SOLN: 0.0000 [IU] | Freq: Three times a day (TID) | SUBCUTANEOUS | Status: DC

## 2017-07-01 MED ORDER — HYDRALAZINE HCL 50 MG PO TABS
50.0000 mg | ORAL_TABLET | ORAL | Status: DC
  Administered 2017-07-01: 50 mg via ORAL
  Filled 2017-07-01 (×3): qty 1

## 2017-07-01 MED ORDER — SEVELAMER CARBONATE 800 MG PO TABS
800.0000 mg | ORAL_TABLET | Freq: Three times a day (TID) | ORAL | Status: DC
  Administered 2017-07-01 – 2017-07-02 (×4): 800 mg via ORAL
  Filled 2017-07-01 (×4): qty 1

## 2017-07-01 MED ORDER — ADULT MULTIVITAMIN W/MINERALS CH
1.0000 | ORAL_TABLET | Freq: Every day | ORAL | Status: DC
  Administered 2017-07-01: 1 via ORAL
  Filled 2017-07-01: qty 1

## 2017-07-01 MED ORDER — HYDRALAZINE HCL 50 MG PO TABS
50.0000 mg | ORAL_TABLET | ORAL | Status: DC
  Administered 2017-07-02: 50 mg via ORAL

## 2017-07-01 MED ORDER — LEVETIRACETAM 500 MG PO TABS
1500.0000 mg | ORAL_TABLET | Freq: Every day | ORAL | Status: DC
  Administered 2017-07-01: 1500 mg via ORAL
  Filled 2017-07-01: qty 3

## 2017-07-01 MED ORDER — ATORVASTATIN CALCIUM 20 MG PO TABS
40.0000 mg | ORAL_TABLET | Freq: Every day | ORAL | Status: DC
  Administered 2017-07-01: 40 mg via ORAL
  Filled 2017-07-01: qty 2

## 2017-07-01 MED ORDER — ISOSORBIDE MONONITRATE ER 30 MG PO TB24
90.0000 mg | ORAL_TABLET | Freq: Every day | ORAL | Status: DC
  Administered 2017-07-01: 90 mg via ORAL
  Filled 2017-07-01: qty 3

## 2017-07-01 MED ORDER — FLUTICASONE PROPIONATE 50 MCG/ACT NA SUSP
1.0000 | Freq: Every day | NASAL | Status: DC
  Administered 2017-07-01 – 2017-07-02 (×2): 1 via NASAL
  Filled 2017-07-01: qty 16

## 2017-07-01 MED ORDER — ESCITALOPRAM OXALATE 5 MG PO TABS
2.5000 mg | ORAL_TABLET | Freq: Every day | ORAL | Status: DC
  Administered 2017-07-01: 2.5 mg via ORAL
  Filled 2017-07-01 (×3): qty 1

## 2017-07-01 MED ORDER — LISINOPRIL 10 MG PO TABS
10.0000 mg | ORAL_TABLET | Freq: Every day | ORAL | Status: DC
  Administered 2017-07-01: 10 mg via ORAL
  Filled 2017-07-01: qty 1

## 2017-07-01 MED ORDER — LACTULOSE 10 GM/15ML PO SOLN
10.0000 g | Freq: Every day | ORAL | Status: DC
  Administered 2017-07-01: 10 g via ORAL
  Filled 2017-07-01: qty 30

## 2017-07-01 MED ORDER — ACETAMINOPHEN 325 MG PO TABS: 650.0000 mg | ORAL_TABLET | Freq: Four times a day (QID) | ORAL | Status: DC | PRN

## 2017-07-01 MED ORDER — TRAZODONE HCL 50 MG PO TABS: 25.0000 mg | ORAL_TABLET | Freq: Every evening | ORAL | Status: DC | PRN

## 2017-07-01 MED ORDER — ONDANSETRON HCL 4 MG/2ML IJ SOLN: 4.0000 mg | Freq: Four times a day (QID) | INTRAMUSCULAR | Status: DC | PRN

## 2017-07-01 MED ORDER — ACETAMINOPHEN 650 MG RE SUPP: 650.0000 mg | Freq: Four times a day (QID) | RECTAL | Status: DC | PRN

## 2017-07-01 MED ORDER — HEPARIN SODIUM (PORCINE) 5000 UNIT/ML IJ SOLN
5000.0000 [IU] | Freq: Three times a day (TID) | INTRAMUSCULAR | Status: DC
  Administered 2017-07-01 – 2017-07-02 (×3): 5000 [IU] via SUBCUTANEOUS
  Filled 2017-07-01 (×3): qty 1

## 2017-07-01 MED ORDER — POLYETHYLENE GLYCOL 3350 17 G PO PACK
17.0000 g | PACK | Freq: Every day | ORAL | Status: DC
  Administered 2017-07-02: 17 g via ORAL
  Filled 2017-07-01: qty 1

## 2017-07-01 MED ORDER — ASPIRIN 81 MG PO CHEW
81.0000 mg | CHEWABLE_TABLET | Freq: Every day | ORAL | Status: DC
  Administered 2017-07-01: 81 mg via ORAL
  Filled 2017-07-01: qty 1

## 2017-07-01 MED ORDER — INSULIN DETEMIR 100 UNIT/ML ~~LOC~~ SOLN
4.0000 [IU] | Freq: Every day | SUBCUTANEOUS | Status: DC
  Administered 2017-07-01 – 2017-07-02 (×2): 4 [IU] via SUBCUTANEOUS
  Filled 2017-07-01 (×5): qty 0.04

## 2017-07-01 MED ORDER — MELATONIN 5 MG PO TABS
5.0000 mg | ORAL_TABLET | Freq: Every day | ORAL | Status: DC
  Administered 2017-07-01 – 2017-07-02 (×2): 5 mg via ORAL
  Filled 2017-07-01 (×2): qty 1

## 2017-07-01 MED ORDER — SENNOSIDES-DOCUSATE SODIUM 8.6-50 MG PO TABS: 1.0000 | ORAL_TABLET | Freq: Every evening | ORAL | Status: DC | PRN

## 2017-07-01 MED ORDER — HYDROCODONE-ACETAMINOPHEN 5-325 MG PO TABS
1.0000 | ORAL_TABLET | Freq: Four times a day (QID) | ORAL | Status: DC | PRN
  Administered 2017-07-01 – 2017-07-02 (×4): 1 via ORAL
  Filled 2017-07-01 (×5): qty 1

## 2017-07-01 MED ORDER — BENZTROPINE MESYLATE 0.5 MG PO TABS
0.5000 mg | ORAL_TABLET | Freq: Two times a day (BID) | ORAL | Status: DC
  Administered 2017-07-01 – 2017-07-02 (×2): 0.5 mg via ORAL
  Filled 2017-07-01 (×4): qty 1

## 2017-07-01 MED ORDER — CLOPIDOGREL BISULFATE 75 MG PO TABS
75.0000 mg | ORAL_TABLET | Freq: Every day | ORAL | Status: DC
  Administered 2017-07-01: 75 mg via ORAL
  Filled 2017-07-01: qty 1

## 2017-07-01 MED ORDER — INSULIN ASPART 100 UNIT/ML ~~LOC~~ SOLN
7.0000 [IU] | Freq: Three times a day (TID) | SUBCUTANEOUS | Status: DC
  Administered 2017-07-01: 7 [IU] via SUBCUTANEOUS
  Filled 2017-07-01: qty 1

## 2017-07-01 MED ORDER — GABAPENTIN 300 MG PO CAPS
300.0000 mg | ORAL_CAPSULE | Freq: Every day | ORAL | Status: DC
  Administered 2017-07-01: 300 mg via ORAL
  Filled 2017-07-01: qty 1

## 2017-07-01 MED ORDER — ONDANSETRON HCL 4 MG PO TABS: 4.0000 mg | ORAL_TABLET | Freq: Four times a day (QID) | ORAL | Status: DC | PRN

## 2017-07-01 MED ORDER — PANTOPRAZOLE SODIUM 40 MG PO TBEC
40.0000 mg | DELAYED_RELEASE_TABLET | Freq: Every day | ORAL | Status: DC
  Administered 2017-07-01: 40 mg via ORAL
  Filled 2017-07-01: qty 1

## 2017-07-01 MED ORDER — AMLODIPINE BESYLATE 10 MG PO TABS
10.0000 mg | ORAL_TABLET | Freq: Every day | ORAL | Status: DC
  Administered 2017-07-01: 10 mg via ORAL
  Filled 2017-07-01: qty 1

## 2017-07-01 MED ORDER — TORSEMIDE 100 MG PO TABS
100.0000 mg | ORAL_TABLET | Freq: Every day | ORAL | Status: DC
  Administered 2017-07-01: 100 mg via ORAL
  Filled 2017-07-01 (×3): qty 1

## 2017-07-01 MED ORDER — LINAGLIPTIN 5 MG PO TABS
5.0000 mg | ORAL_TABLET | Freq: Every day | ORAL | Status: DC
  Administered 2017-07-01: 5 mg via ORAL
  Filled 2017-07-01 (×3): qty 1

## 2017-07-01 NOTE — ED Notes (Signed)
Date and time results received: 07/01/17 9:29 AM   Test: potassium  Critical Value: 7.4  Name of Provider Notified: Dr. Jene Every  Orders Received? Or Actions Taken?: no further orders currently, verbally acknowledges

## 2017-07-01 NOTE — ED Notes (Signed)
Pt taken to dialysis by Mardelle Matte, EMTP on 2L [place] and cardiac monitor. Pt to go to hospital room after dialysis is completed.

## 2017-07-01 NOTE — ED Notes (Signed)
Pt now states that she does make urine.

## 2017-07-01 NOTE — ED Notes (Signed)
Report to [facility] in dialysis.

## 2017-07-01 NOTE — Progress Notes (Signed)
Post HD assessment unchanged  

## 2017-07-01 NOTE — Progress Notes (Signed)
HD completed without issue. Tolerated well. UF goal 2.5L met. Report called to floor.

## 2017-07-01 NOTE — H&P (Signed)
[facility] [place] at [facility]   PATIENT NAME: Kimberly Montgomery    MR#:  [id number]  DATE OF BIRTH:  1965/07/25  DATE OF ADMISSION:  07/01/2017  PRIMARY CARE PHYSICIAN: Dorothey Baseman, MD   REQUESTING/REFERRING PHYSICIAN: Dr. Cyril Loosen  CHIEF COMPLAINT:   Elevated potassium HISTORY OF PRESENT ILLNESS:  Kimberly Montgomery  is a 52 y.o. female with a known history of end-stage renal disease on hemodialysis, bipolar disorder/anxiety, CAD, hypertension, diabetes comes to the emergency room with generalized body aches/myalgia fatigue and fever at home. She missed her dialysis yesterday.  Potassium today 7.4.  EKG does not show any peaked T waves. ER physician informed nephrology for urgent hemodialysis. Patient is tearful in the ER since her tailbone is hurting. Her influenza PCR is negative.  Patient is being admitted for further evaluation and management.  PAST MEDICAL HISTORY:   Past Medical History:  Diagnosis Date  . Anginal pain (HCC)   . Anxiety   . Bipolar disorder (HCC)   . CAD (coronary artery disease)   . CHF (congestive heart failure) (HCC)   . Chronic lower back pain   . Depression   . Endometriosis   . ESRD (end stage renal disease) on dialysis (HCC)    "[facility]; [address]; [place]; TTS" (01/19/2016)  . Gastroparesis   . GERD (gastroesophageal reflux disease)   . History of blood transfusion "several"   "my blood would get low; low RBC"  . History of hiatal hernia   . HLD (hyperlipidemia)   . Hypertension   . Migraine    "monthly" (01/19/2016)  . Myocardial infarction (HCC) 2017   "~ 3 wks ago" (01/19/2016)  . Renal disorder   . Renal insufficiency   . Seizures (HCC) 07/2015   "I've only had the 1; don't know what from" (01/19/2016)  . Stroke (HCC)   . Type II diabetes mellitus (HCC)     PAST SURGICAL HISTOIRY:   Past Surgical History:  Procedure Laterality Date  . ABDOMINAL HYSTERECTOMY     "partial"  . AV FISTULA PLACEMENT  Right 08/23/2016   Procedure: ARTERIOVENOUS (AV) FISTULA CREATION  ( BRACHIAL CEPHALIC );  Surgeon: Renford Dills, MD;  Location: [facility] ORS;  Service: Vascular;  Laterality: Right;  . BELOW KNEE LEG AMPUTATION Right 2010?  [name] CARDIAC CATHETERIZATION Right 07/10/2015   Procedure: Left Heart Cath and Coronary Angiography;  Surgeon: Laurier Nancy, MD;  Location: [facility] INVASIVE CV LAB;  Service: Cardiovascular;  Laterality: Right;  . CARDIAC CATHETERIZATION Right 09/14/2015   Procedure: Left Heart Cath and Coronary Angiography;  Surgeon: Laurier Nancy, MD;  Location: [facility] INVASIVE CV LAB;  Service: Cardiovascular;  Laterality: Right;  . CARDIAC CATHETERIZATION N/A 09/14/2015   Procedure: Coronary Stent Intervention;  Surgeon: Alwyn Pea, MD;  Location: [facility] INVASIVE CV LAB;  Service: Cardiovascular;  Laterality: N/A;  . CARDIAC CATHETERIZATION Right 11/20/2015   Procedure: Left Heart Cath and Coronary Angiography;  Surgeon: Laurier Nancy, MD;  Location: [facility] INVASIVE CV LAB;  Service: Cardiovascular;  Laterality: Right;  . CATARACT EXTRACTION, BILATERAL    . CORONARY ANGIOPLASTY WITH STENT PLACEMENT  <2017   @ [facility]/notes 07/03/2013  . CORONARY ANGIOPLASTY WITH STENT PLACEMENT    . DIALYSIS/PERMA CATHETER REMOVAL N/A 11/12/2016   Procedure: DIALYSIS/PERMA CATHETER REMOVAL;  Surgeon: Renford Dills, MD;  Location: [facility] INVASIVE CV LAB;  Service: Cardiovascular;  Laterality: N/A;  . HYSTEROTOMY    . PERIPHERAL VASCULAR CATHETERIZATION N/A 08/30/2015   Procedure:  Dialysis/Perma Catheter Insertion;  Surgeon: Annice Needy, MD;  Location: [facility] INVASIVE CV LAB;  Service: Cardiovascular;  Laterality: N/A;  . PERIPHERAL VASCULAR CATHETERIZATION N/A 01/01/2016   Procedure: Dialysis/Perma Catheter Insertion;  Surgeon: Annice Needy, MD;  Location: [facility] INVASIVE CV LAB;  Service: Cardiovascular;  Laterality: N/A;  . PERITONEAL CATHETER INSERTION  11/03/2013   [name] 11/03/2013  . PERITONEAL CATHETER REMOVAL   01/01/2016   "took the one from May out; put new PD cath in" (01/19/2016)  . PERITONEAL CATHETER REMOVAL  11/03/2013; 02/11/2014   [name] 11/03/2013; Removal of tunneled catheter/notes 02/11/2014  . SALPINGOOPHORECTOMY Right    [name] 07/03/2013  . TEE WITHOUT CARDIOVERSION N/A 06/07/2016   Procedure: TRANSESOPHAGEAL ECHOCARDIOGRAM (TEE);  Surgeon: Chrystie Nose, MD;  Location: [facility] ENDOSCOPY;  Service: Cardiovascular;  Laterality: N/A;  . TUBAL LIGATION      SOCIAL HISTORY:   Social History   Tobacco Use  . Smoking status: Former Smoker    Packs/day: 0.75    Types: Cigarettes    Last attempt to quit: 07/01/2015    Years since quitting: 2.0  . Smokeless tobacco: Never Used  Substance Use Topics  . Alcohol use: No    Alcohol/week: 0.0 oz    FAMILY HISTORY:   Family History  Problem Relation Age of Onset  . CAD Unknown   . Diabetes Unknown   . Bipolar disorder Unknown   . Cervical cancer Mother   . Other Father        heart bypass  . Breast cancer Neg Hx     DRUG ALLERGIES:   Allergies  Allergen Reactions  . Cephalosporins Anaphylaxis    Patient has tolerated meropenem.   [name] Penicillins Anaphylaxis and Other (See Comments)    Has patient had a PCN reaction causing immediate rash, facial/tongue/throat swelling, SOB or lightheadedness with hypotension: Yes Has patient had a PCN reaction causing severe rash involving mucus membranes or skin necrosis: No Has patient had a PCN reaction that required hospitalization No Has patient had a PCN reaction occurring within the last 10 years: No If all of the above answers are "NO", then may proceed with Cephalosporin use.  . Reglan [Metoclopramide] Other (See Comments)    Severe EPS (lip, head, body tremors) March 2018  . Risperdal [Risperidone] Other (See Comments)    Severe EPS (lip, head, body tremors) March 2018  . Lamictal [Lamotrigine] Other (See Comments)    Reaction:  Hallucinations  . Phenergan [Promethazine Hcl] Nausea And  Vomiting    Sensitivity to medicine  . Pravastatin Other (See Comments)    Reaction:  Muscle pain   . Sulfa Antibiotics Other (See Comments)    Reaction:  Unknown     REVIEW OF SYSTEMS:  Review of Systems  Constitutional: Positive for fever and malaise/fatigue. Negative for chills and weight loss.  HENT: Negative for ear discharge, ear pain and nosebleeds.   Eyes: Negative for blurred vision, pain and discharge.  Respiratory: Negative for sputum production, shortness of breath, wheezing and stridor.   Cardiovascular: Negative for chest pain, palpitations, orthopnea and PND.  Gastrointestinal: Negative for abdominal pain, diarrhea, nausea and vomiting.  Genitourinary: Negative for frequency and urgency.  Musculoskeletal: Positive for joint pain. Negative for back pain.  Neurological: Negative for sensory change, speech change, focal weakness and weakness.  Psychiatric/Behavioral: Negative for depression and hallucinations. The patient is not nervous/anxious.      MEDICATIONS AT HOME:   Prior to Admission medications   Medication Sig Start  Date End Date Taking? Authorizing Provider  acetaminophen (TYLENOL) 500 MG tablet Take 500 mg by mouth every 6 (six) hours as needed for moderate pain or fever.   Yes [provider]  amLODipine (NORVASC) 10 MG tablet Take 10 mg by mouth daily.    Yes [provider]  aspirin 81 MG chewable tablet Chew 81 mg by mouth daily.   Yes [provider]  atorvastatin (LIPITOR) 40 MG tablet Take 40 mg by mouth daily at 6 PM.   Yes [provider]  benztropine (COGENTIN) 0.5 MG tablet Take 0.5 mg by mouth 2 (two) times daily.   Yes [provider]  bisacodyl (DULCOLAX) 10 MG suppository Place 10 mg rectally as needed for moderate constipation.   Yes [provider]  carboxymethylcellulose (REFRESH) 1 % ophthalmic solution Place 1 drop into both eyes 3 (three) times daily.   Yes [provider]   clonazePAM (KLONOPIN) 0.5 MG tablet Take  tablet (0.25MG ) by mouth every morning and 1 tablet (0.5MG ) by mouth every evening   Yes [provider]  clopidogrel (PLAVIX) 75 MG tablet Take 75 mg by mouth daily at 12 noon.    Yes [provider]  escitalopram (LEXAPRO) 5 MG tablet Take 2.5 mg by mouth daily.   Yes [provider]  fluticasone (FLONASE) 50 MCG/ACT nasal spray Place 1 spray into both nostrils daily.    Yes [provider]  gabapentin (NEURONTIN) 300 MG capsule Take 300 mg by mouth daily.    Yes [provider]  hydrALAZINE (APRESOLINE) 50 MG tablet Take 1 tablet (50MG ) by mouth three times daily (Sunday, Tuesday, Thursday & Saturday) and take 1 tablet (50MG ) by mouth twice daily (Monday, Wednesday & Friday).   Yes [provider]  HYDROcodone-acetaminophen (NORCO/VICODIN) 5-325 MG tablet Take 1 tablet by mouth every 6 (six) hours as needed for moderate pain or severe pain. 05/20/17  Yes Delfino Lovett, MD  insulin aspart (NOVOLOG) 100 UNIT/ML injection Inject 7 Units into the skin 3 (three) times daily with meals.    Yes [provider]  insulin detemir (LEVEMIR) 100 UNIT/ML injection Inject 4 Units into the skin daily.    Yes [provider]  isosorbide mononitrate (IMDUR) 30 MG 24 hr tablet Take 90 mg by mouth daily at 12 noon.    Yes [provider]  lactulose (CHRONULAC) 10 GM/15ML solution Take 10 g by mouth daily at 6 PM.    Yes [provider]  levETIRAcetam (KEPPRA) 750 MG tablet Take 2 tablets (1,500 mg total) by mouth daily at 12 noon. 05/20/17  Yes Delfino Lovett, MD  lidocaine (LIDODERM) 5 % Place 1 patch onto the skin at bedtime.    Yes [provider]  lidocaine (LMX) 4 % cream Apply 1 application topically daily as needed (pain). To right arm fistula   Yes [provider]  linagliptin (TRADJENTA) 5 MG TABS tablet Take 5 mg by mouth daily.   Yes [provider]   lisinopril (PRINIVIL,ZESTRIL) 10 MG tablet Take 10 mg by mouth daily at 12 noon.   Yes [provider]  Melatonin 3 MG TABS Take 3 mg by mouth at bedtime.   Yes [provider]  metoprolol (LOPRESSOR) 50 MG tablet Take 1 tablet (50 mg total) by mouth 2 (two) times daily. 09/15/15  Yes Sudini, Wardell Heath, MD  multivitamin (ONE-A-DAY MEN'S) TABS tablet Take 1 tablet by mouth daily.   Yes [provider]  nitroGLYCERIN (NITROSTAT)  0.4 MG SL tablet Place 0.4 mg under the tongue every 5 (five) minutes as needed for chest pain. Reported on 07/10/2015   Yes [provider]  omeprazole (PRILOSEC) 20 MG capsule Take 20 mg by mouth 2 (two) times daily.    Yes [provider]  ondansetron (ZOFRAN ODT) 4 MG disintegrating tablet Take 1 tablet (4 mg total) by mouth every 6 (six) hours as needed for nausea or vomiting. 07/16/15  Yes Sharyn Creamer, MD  OXcarbazepine (TRILEPTAL) 150 MG tablet Take 150 mg by mouth at bedtime.   Yes [provider]  polyethylene glycol (MIRALAX / GLYCOLAX) packet Take 17 g by mouth daily. Patient taking differently: Take 17 g by mouth daily at 12 noon.  08/02/15  Yes Adrian Saran, MD  senna (SENOKOT) 8.6 MG TABS tablet Take 2 tablets by mouth 2 (two) times daily.    Yes [provider]  sevelamer carbonate (RENVELA) 800 MG tablet Take 800 mg by mouth 4 (four) times daily -  before meals and at bedtime.    Yes [provider]  torsemide (DEMADEX) 100 MG tablet Take 100 mg by mouth daily.    Yes [provider]  oseltamivir (TAMIFLU) 30 MG capsule Take 1 capsule (30 mg total) by mouth every Tuesday, Thursday, and Saturday at 6 PM. Patient not taking: Reported on 07/01/2017 05/20/17   Delfino Lovett, MD      VITAL SIGNS:  Blood pressure (!) 176/66, pulse 64, temperature 98.8 F (37.1 C), temperature source Oral, resp. rate 14, height 5\' 9"  (1.753 m), weight 104.3 kg (230 lb), last menstrual period 03/16/2015, SpO2 97  %.  PHYSICAL EXAMINATION:  GENERAL:  52 y.o.-year-old patient lying in the bed with no acute distress. obese EYES: Pupils equal, round, reactive to light and accommodation. No scleral icterus. Extraocular muscles intact.  HEENT: Head atraumatic, normocephalic. Oropharynx and nasopharynx clear.  NECK:  Supple, no jugular venous distention. No thyroid enlargement, no tenderness.  LUNGS: Normal breath sounds bilaterally, no wheezing, rales,rhonchi or crepitation. No use of accessory muscles of respiration.  CARDIOVASCULAR: S1, S2 normal. No murmurs, rubs, or gallops.  ABDOMEN: Soft, nontender, nondistended. Bowel sounds present. No organomegaly or mass.  EXTREMITIES: No pedal edema, cyanosis, or clubbing.  NEUROLOGIC: Cranial nerves II through XII are intact. Muscle strength 5/5 in all extremities. Sensation intact. Gait not checked.  PSYCHIATRIC: The patient is alert and oriented x 3.  SKIN: No obvious rash, lesion, or ulcer.   LABORATORY PANEL:   CBC Recent Labs  Lab 07/01/17 0856  WBC 7.7  HGB 10.8*  HCT 33.4*  PLT 255   ------------------------------------------------------------------------------------------------------------------  Chemistries  Recent Labs  Lab 07/01/17 0856  NA 128*  K 7.4*  CL 91*  CO2 25  GLUCOSE 115*  BUN 53*  CREATININE 7.74*  CALCIUM 8.9  AST 17  ALT 7*  ALKPHOS 150*  BILITOT 0.7   ------------------------------------------------------------------------------------------------------------------  Cardiac Enzymes No results for input(s): TROPONINI in the last 168 hours. ------------------------------------------------------------------------------------------------------------------  RADIOLOGY:  Dg Chest Portable 1 View  Result Date: 07/01/2017 CLINICAL DATA:  Weakness. EXAM: PORTABLE CHEST 1 VIEW COMPARISON:  05/15/2017 FINDINGS: There is moderate cardiac enlargement. No pleural effusion or edema identified. No airspace opacities  identified. Interval clearing of previous right upper lobe consolidation. IMPRESSION: 1. No acute cardiopulmonary abnormalities. 2. Cardiac enlargement. Electronically Signed   By: Signa Kell M.D.   On: 07/01/2017 09:22    EKG:   Normal sinus rhythm no peak T waves IMPRESSION AND  PLAN:   Derrick Orris  is a 52 y.o. female with a known history of end-stage renal disease on hemodialysis, bipolar disorder/anxiety, CAD, hypertension, diabetes comes to the emergency room with generalized body aches/myalgia fatigue and fever at home. She missed her dialysis yesterday.  Potassium today 7.4.  EKG does not show any peaked T waves.  1.  Acute hyperkalemia secondary to missing hemodialysis -Admit to medical floor -Nephrology to do urgent hemodialysis.  No peak T waves in EKG -Dr. Wynelle Link aware  2.  Malignant hypertension continue home meds -PRN hydralazine if needed  3.  Type 2 diabetes -Siding scale insulin, oral meds and insulin home dose  4.  Bipolar disorder/anxiety -Continue psych meds  5.  DVT prophylaxis subcu heparin  6.  Myalgias, fatigue -Influenza PCR negative -Treat symptomatically     All the records are reviewed and case discussed with ED provider. Management plans discussed with the patient, family and they are in agreement.  CODE STATUS: Full code  TOTAL CRITICAL TIME TAKING CARE OF THIS PATIENT: 55 minutes.    Enedina Finner M.D on 07/01/2017 at 11:36 AM  Between 7am to 6pm - Pager - [phone]  After 6pm go to [email or web address] - password [email or web address] [facility]  [facility]  Office  [phone]  CC: Primary care physician; Dorothey Baseman, MD

## 2017-07-01 NOTE — ED Notes (Signed)
Report to Annabella, RN  

## 2017-07-01 NOTE — ED Notes (Signed)
Pt states that she does not make urine, has not made urine X 5 years.

## 2017-07-01 NOTE — ED Notes (Signed)
EDP at bedside, pt now denies abdominal pain and is c/o generalized body aches at this time.

## 2017-07-01 NOTE — ED Provider Notes (Addendum)
[facility] Emergency Department Provider Note   ____________________________________________    I have reviewed the triage vital signs and the nursing notes.   HISTORY  Chief Complaint Weakness and Generalized Body Aches     HPI Kimberly Montgomery is a 52 y.o. female who presents with complaints of weakness and diffuse body aches.  Patient notes over the last 24-48 hrs. she has been feeling weak and achy all over.  Patient comes from nursing home.  No reports of fever.  No reports of nausea or vomiting.  Did get a flu shot this year reportedly.  No abdominal pain.  No rash reported.  Missed dialysis yesterday, unclear if she received dialysis on Friday, does complain of some mild shortness of breath.   Past Medical History:  Diagnosis Date  . Anginal pain (HCC)   . Anxiety   . Bipolar disorder (HCC)   . CAD (coronary artery disease)   . CHF (congestive heart failure) (HCC)   . Chronic lower back pain   . Depression   . Endometriosis   . ESRD (end stage renal disease) on dialysis (HCC)    "[facility]; [address]; [place]; TTS" (01/19/2016)  . Gastroparesis   . GERD (gastroesophageal reflux disease)   . History of blood transfusion "several"   "my blood would get low; low RBC"  . History of hiatal hernia   . HLD (hyperlipidemia)   . Hypertension   . Migraine    "monthly" (01/19/2016)  . Myocardial infarction (HCC) 2017   "~ 3 wks ago" (01/19/2016)  . Renal disorder   . Renal insufficiency   . Seizures (HCC) 07/2015   "I've only had the 1; don't know what from" (01/19/2016)  . Stroke (HCC)   . Type II diabetes mellitus [facility])     Patient Active Problem List   Diagnosis Date Noted  . Pneumonia 05/15/2017  . Benign essential tremor 01/30/2017  . Seizure (HCC)   . Acute encephalopathy   . Tremor   . Cerebrovascular disease   . Complication from renal dialysis device 04/08/2016  . Colitis 03/13/2016  . Chronic diastolic congestive heart  failure (HCC) 01/22/2016  . Pressure injury of skin 01/20/2016  . Bacteremia, coagulase-negative staphylococcal 12/27/2015  . Essential hypertension 11/21/2015  . Anemia of chronic disease 11/21/2015  . Acute respiratory failure with hypoxia (HCC) 11/21/2015  . ESRD on dialysis (HCC) 11/21/2015  . 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 NSTEMI (non-ST elevated myocardial infarction) (HCC) 11/20/2015  . Chest pain, rule out acute myocardial infarction 11/18/2015  . Bipolar I disorder, most recent episode depressed (HCC)   . Altered mental status 08/24/2015  . Ileus (HCC)   . Bipolar I disorder (HCC) 07/25/2015  . Seizures (HCC) 07/25/2015  . Peritonitis (HCC) 07/16/2015  . Unstable angina (HCC) 07/09/2015  . Accelerated hypertension 07/09/2015  . Type 2 diabetes mellitus (HCC) 07/09/2015  . CAD (coronary artery disease) 07/09/2015  . HLD (hyperlipidemia) 07/09/2015  . GERD (gastroesophageal reflux disease) 07/09/2015    Past Surgical History:  Procedure Laterality Date  . ABDOMINAL HYSTERECTOMY     "partial"  . AV FISTULA PLACEMENT Right 08/23/2016   Procedure: ARTERIOVENOUS (AV) FISTULA CREATION  ( BRACHIAL CEPHALIC );  Surgeon: Renford Dills, MD;  Location: [facility] ORS;  Service: Vascular;  Laterality: Right;  . BELOW KNEE LEG AMPUTATION Right 2010?  [name] CARDIAC CATHETERIZATION Right 07/10/2015   Procedure: Left Heart Cath and Coronary Angiography;  Surgeon: Laurier Nancy, MD;  Location: [facility]  INVASIVE CV LAB;  Service: Cardiovascular;  Laterality: Right;  . CARDIAC CATHETERIZATION Right 09/14/2015   Procedure: Left Heart Cath and Coronary Angiography;  Surgeon: Laurier Nancy, MD;  Location: [facility] INVASIVE CV LAB;  Service: Cardiovascular;  Laterality: Right;  . CARDIAC CATHETERIZATION N/A 09/14/2015   Procedure: Coronary Stent Intervention;  Surgeon: Alwyn Pea, MD;  Location: [facility] INVASIVE CV LAB;  Service: Cardiovascular;  Laterality: N/A;  . CARDIAC CATHETERIZATION Right 11/20/2015    Procedure: Left Heart Cath and Coronary Angiography;  Surgeon: Laurier Nancy, MD;  Location: [facility] INVASIVE CV LAB;  Service: Cardiovascular;  Laterality: Right;  . CATARACT EXTRACTION, BILATERAL    . CORONARY ANGIOPLASTY WITH STENT PLACEMENT  <2017   @ [facility]/notes 07/03/2013  . CORONARY ANGIOPLASTY WITH STENT PLACEMENT    . DIALYSIS/PERMA CATHETER REMOVAL N/A 11/12/2016   Procedure: DIALYSIS/PERMA CATHETER REMOVAL;  Surgeon: Renford Dills, MD;  Location: [facility] INVASIVE CV LAB;  Service: Cardiovascular;  Laterality: N/A;  . HYSTEROTOMY    . PERIPHERAL VASCULAR CATHETERIZATION N/A 08/30/2015   Procedure: Dialysis/Perma Catheter Insertion;  Surgeon: Annice Needy, MD;  Location: [facility] INVASIVE CV LAB;  Service: Cardiovascular;  Laterality: N/A;  . PERIPHERAL VASCULAR CATHETERIZATION N/A 01/01/2016   Procedure: Dialysis/Perma Catheter Insertion;  Surgeon: Annice Needy, MD;  Location: [facility] INVASIVE CV LAB;  Service: Cardiovascular;  Laterality: N/A;  . PERITONEAL CATHETER INSERTION  11/03/2013   [name] 11/03/2013  . PERITONEAL CATHETER REMOVAL  01/01/2016   "took the one from May out; put new PD cath in" (01/19/2016)  . PERITONEAL CATHETER REMOVAL  11/03/2013; 02/11/2014   [name] 11/03/2013; Removal of tunneled catheter/notes 02/11/2014  . SALPINGOOPHORECTOMY Right    [name] 07/03/2013  . TEE WITHOUT CARDIOVERSION N/A 06/07/2016   Procedure: TRANSESOPHAGEAL ECHOCARDIOGRAM (TEE);  Surgeon: Chrystie Nose, MD;  Location: [facility] ENDOSCOPY;  Service: Cardiovascular;  Laterality: N/A;  . TUBAL LIGATION      Prior to Admission medications   Medication Sig Start Date End Date Taking? Authorizing Provider  acetaminophen (TYLENOL) 500 MG tablet Take 500 mg by mouth every 6 (six) hours as needed for moderate pain or fever.    [provider]  amLODipine (NORVASC) 10 MG tablet Take 10 mg by mouth daily.     [provider]  aspirin 81 MG chewable tablet Chew 81 mg by mouth daily.    [provider]  atorvastatin (LIPITOR) 40 MG tablet Take 40 mg by mouth daily at 6 PM.    [provider]  benztropine (COGENTIN) 0.5 MG tablet Take 0.5 mg by mouth 2 (two) times daily.    [provider]  bisacodyl (DULCOLAX) 10 MG suppository Place 10 mg rectally as needed for moderate constipation.    [provider]  carboxymethylcellulose (REFRESH) 1 % ophthalmic solution Place 1 drop into both eyes 3 (three) times daily.    [provider]  clonazePAM (KLONOPIN) 0.5 MG tablet Take 0.5 tablets (0.25 mg total) by mouth 2 (two) times daily. 05/20/17   Delfino Lovett, MD  clopidogrel (PLAVIX) 75 MG tablet Take 75 mg by mouth daily at 12 noon.     [provider]  escitalopram (LEXAPRO) 5 MG tablet Take 2.5 mg by mouth daily.    [provider]  fluticasone (FLONASE) 50 MCG/ACT nasal spray Place 1 spray into both nostrils daily.     [provider]  gabapentin (NEURONTIN) 300 MG capsule Take 300 mg by mouth daily.     [provider]  hydrALAZINE (APRESOLINE) 50 MG tablet Take 1 tablet (50 mg total) by mouth 3 (three) times daily. Patient taking differently: Take 75 mg by mouth 3 (three) times daily. On Sunday, Tuesday, Thursday, and Saturday 06/09/16   Leroy Sea, MD  hydrALAZINE (APRESOLINE) 50 MG tablet Take 50 mg by mouth 2 (two) times daily. On Monday, Wednesday, and Friday    [provider]  HYDROcodone-acetaminophen (NORCO/VICODIN) 5-325 MG tablet Take 1 tablet by mouth every 6 (six) hours as needed for moderate pain or severe pain. 05/20/17   Delfino Lovett, MD  insulin aspart (NOVOLOG) 100 UNIT/ML injection Inject 7 Units into the skin 3 (three) times daily with meals.     [provider]  insulin detemir (LEVEMIR) 100 UNIT/ML injection Inject 4 Units into the skin daily.     [provider]  isosorbide mononitrate (IMDUR) 30 MG 24 hr tablet Take 90 mg by mouth daily at 12 noon.     [provider]  lactulose (CHRONULAC) 10 GM/15ML solution Take 10 g by mouth daily at 6 PM.     [provider]  levETIRAcetam (KEPPRA) 750 MG tablet Take 2 tablets (1,500 mg total) by mouth daily at 12 noon. 05/20/17   Delfino Lovett, MD  lidocaine (LIDODERM) 5 % Place 1 patch onto the skin at bedtime.     [provider]  lidocaine (LMX) 4 % cream Apply 1 application topically daily as needed (pain). To right arm fistula    [provider]  linagliptin (TRADJENTA) 5 MG TABS tablet Take 5 mg by mouth daily.    [provider]  lisinopril (PRINIVIL,ZESTRIL) 10 MG tablet Take 10 mg by mouth daily at 12 noon.    [provider]  Melatonin 3 MG TABS Take 3 mg by mouth at bedtime.    [provider]  metoprolol (LOPRESSOR) 50 MG tablet Take 1 tablet (50 mg total) by mouth 2 (two) times daily. 09/15/15   Milagros Loll, MD  multivitamin (ONE-A-DAY MEN'S) TABS tablet Take 1 tablet by mouth daily.    [provider]  nitroGLYCERIN (NITROSTAT) 0.4 MG SL tablet Place 0.4 mg under the tongue every 5 (five) minutes as needed for chest pain. Reported on 07/10/2015    [provider]  omeprazole (PRILOSEC) 20 MG capsule Take 20 mg by mouth 2 (two) times daily.     [provider]  ondansetron (ZOFRAN ODT) 4 MG disintegrating tablet Take 1 tablet (4 mg total) by mouth every 6 (six) hours as needed for nausea or vomiting. Patient taking differently: Take 4 mg by mouth every 8 (eight) hours as needed for nausea or vomiting.  07/16/15   Sharyn Creamer, MD  oseltamivir (TAMIFLU) 30 MG capsule Take 1 capsule (30 mg total) by mouth every Tuesday, Thursday, and Saturday at 6 PM. 05/20/17   Delfino Lovett, MD  OXcarbazepine (TRILEPTAL) 150 MG tablet Take 150 mg by mouth at bedtime.    [provider]  polyethylene glycol (MIRALAX / GLYCOLAX) packet Take 17 g by mouth daily. Patient taking differently: Take 17 g by mouth daily at 12 noon.  08/02/15    Adrian Saran, MD  senna (SENOKOT) 8.6 MG TABS tablet Take 2 tablets by mouth 2 (two) times daily.     [provider]  sevelamer carbonate (RENVELA) 800 MG tablet Take 800 mg by mouth 4 (four) times daily -  before meals and at bedtime.     [provider]  torsemide (DEMADEX) 100 MG tablet Take 100 mg by mouth daily.     [provider]     Allergies Cephalosporins; Penicillins; Reglan [metoclopramide]; Risperdal [risperidone]; Lamictal [lamotrigine]; Phenergan [promethazine hcl]; Pravastatin; and Sulfa antibiotics  Family History  Problem Relation Age of Onset  . CAD Unknown   . Diabetes Unknown   . Bipolar disorder Unknown   . Cervical cancer Mother   . Other Father        heart bypass  . Breast cancer Neg Hx     Social History Social History   Tobacco Use  . Smoking status: Former Smoker    Packs/day: 0.75    Types: Cigarettes    Last attempt to quit: 07/01/2015    Years since quitting: 2.0  . Smokeless tobacco: Never Used  Substance Use Topics  . Alcohol use: No    Alcohol/week: 0.0 oz  . Drug use: No    Review of Systems  Constitutional: No fever/chills Eyes: No visual changes.  ENT: No sore throat. Cardiovascular: Denies chest pain. Respiratory: Mild shortness of breath Gastrointestinal: No abdominal pain.     Genitourinary: Negative for dysuria. Musculoskeletal: Positive for myalgias Skin: Negative for rash. Neurological: Negative for headaches   ____________________________________________   PHYSICAL EXAM:  VITAL SIGNS: ED Triage Vitals [07/01/17 0851]  Enc Vitals Group     BP (!) 157/45     Pulse Rate 73     Resp 19     Temp 98.8 F (37.1 C)     Temp Source Oral     SpO2 90 %     Weight 104.3 kg (230 lb)     Height 1.753 m (5\' 9" )     Head Circumference      Peak Flow      Pain Score      Pain Loc      Pain Edu?      Excl. in GC?     Constitutional: Alert, chronically ill-appearing, pleasant and  interactive Eyes: Conjunctivae are normal.   Nose: No congestion/rhinnorhea. Mouth/Throat: Mucous membranes are dry Neck:  Painless ROM Cardiovascular: Normal rate, regular rhythm. Grossly normal heart sounds.  Good peripheral circulation. Respiratory: Normal respiratory effort.  No retractions. Lungs CTAB. Gastrointestinal: Soft and nontender. No distention.  No CVA tenderness. Genitourinary: deferred Musculoskeletal: Right BKA, warm and well perfused Neurologic:  Normal speech and language. No gross focal neurologic deficits are appreciated.  Skin:  Skin is warm, dry and intact. No rash noted. Psychiatric: Mood and affect are normal. Speech and behavior are normal.  ____________________________________________   LABS (all labs ordered are listed, but only abnormal results are displayed)  Labs Reviewed  CBC - Abnormal; Notable for the following components:      Result Value   RBC 3.70 (*)    Hemoglobin 10.8 (*)    HCT 33.4 (*)    RDW 15.4 (*)    All other components within normal limits  COMPREHENSIVE METABOLIC PANEL - Abnormal; Notable for the following components:   Sodium 128 (*)    Potassium 7.4 (*)    Chloride 91 (*)    Glucose, Bld 115 (*)    BUN 53 (*)    Creatinine, Ser 7.74 (*)    ALT 7 (*)    Alkaline Phosphatase 150 (*)    GFR calc non Af Amer 5 (*)    GFR calc Af Amer 6 (*)    All other components within normal limits  GLUCOSE, CAPILLARY - Abnormal; Notable  for the following components:   Glucose-Capillary 101 (*)    All other components within normal limits  INFLUENZA PANEL BY PCR (TYPE A & B)  URINALYSIS, COMPLETE (UACMP) WITH MICROSCOPIC  CBG MONITORING, ED   ____________________________________________  EKG  ED ECG REPORT I, Jene Every, the attending physician, personally viewed and interpreted this ECG.  Date: 07/01/2017  Rhythm: normal sinus rhythm QRS Axis: normal Intervals: Abnormal ST/T Wave abnormalities: Non-specific  changes Narrative Interpretation: no evidence of acute ischemia  ____________________________________________  RADIOLOGY  Chest x-ray unremarkable ____________________________________________   PROCEDURES  Procedure(s) performed: No  Procedures   Critical Care performed: yes  CRITICAL CARE Performed by: Jene Every   Total critical care time: 30 minutes  Critical care time was exclusive of separately billable procedures and treating other patients.  Critical care was necessary to treat or prevent imminent or life-threatening deterioration.  Critical care was time spent personally by me on the following activities: development of treatment plan with patient and/or surrogate as well as nursing, discussions with consultants, evaluation of patient's response to treatment, examination of patient, obtaining history from patient or surrogate, ordering and performing treatments and interventions, ordering and review of laboratory studies, ordering and review of radiographic studies, pulse oximetry and re-evaluation of patient's condition.  ____________________________________________   INITIAL IMPRESSION / ASSESSMENT AND PLAN / ED COURSE  Pertinent labs & imaging results that were available during my care of the patient were reviewed by me and considered in my medical decision making (see chart for details).  Patient presents with body aches reports of weakness.  She also has some mild shortness of breath.  Differential diagnosis includes influenza, pneumonia, pulmonary edema.  Will check labs, as well as a PCR, chest x-ray and reevaluate  Notified of elevated potassium of 7.4, EKG does not demonstrate any hyperacute T waves, nephrology consulted to initiate urgent dialysis.  Recommends no pharmacological interventions at this time  Hospitalist to admit the patient    ____________________________________________   FINAL CLINICAL IMPRESSION(S) / ED DIAGNOSES  Final  diagnoses:  Acute hyperkalemia        Note:  This document was prepared using Dragon voice recognition software and may include unintentional dictation errors.    Jene Every, MD 07/01/17 1006    Jene Every, MD 07/01/17 1048

## 2017-07-01 NOTE — Progress Notes (Signed)
[facility]  ROUNDING NOTE   Subjective:   [name] admitted to [facility] on 07/01/2017 for Acute hyperkalemia [E87.5]  Patient missed her last two outpatient hemodialysis treatments.   Emergent hemodialysis.     HEMODIALYSIS FLOWSHEET:  Blood Flow Rate (mL/min): 400 mL/min Arterial Pressure (mmHg): -130 mmHg Venous Pressure (mmHg): 220 mmHg Transmembrane Pressure (mmHg): 70 mmHg Ultrafiltration Rate (mL/min): 1750 mL/min Dialysate Flow Rate (mL/min): 600 ml/min Conductivity: Machine : 15.1 Conductivity: Machine : 15.1 Dialysis Fluid Bolus: Normal Saline Bolus Amount (mL): 250 mL    Objective:  Vital signs in last 24 hours:  Temp:  [98 F (36.7 C)-98.8 F (37.1 C)] 98.1 F (36.7 C) (04/02 1553) Pulse Rate:  [61-82] 81 (04/02 1553) Resp:  [10-29] 10 (04/02 1553) BP: (83-179)/(45-128) 122/53 (04/02 1558) SpO2:  [89 %-100 %] 97 % (04/02 1553) Weight:  [104.3 kg (229 lb 15 oz)-104.3 kg (230 lb)] 104.3 kg (229 lb 15 oz) (04/02 1228)  Weight change:  Filed Weights   07/01/17 0851 07/01/17 1228  Weight: 104.3 kg (230 lb) 104.3 kg (229 lb 15 oz)    Intake/Output: No intake/output data recorded.   Intake/Output this shift:  Total I/O In: -  Out: 2550 [Other:2550]  Physical Exam: General: NAD,   Head: Normocephalic, atraumatic. Moist oral mucosal membranes  Eyes: Anicteric, PERRL  Neck: Supple, trachea midline  Lungs:  Clear to auscultation  Heart: Regular rate and rhythm  Abdomen:  Soft, nontender,   Extremities: no peripheral edema.  Neurologic: Nonfocal, moving all four extremities  Skin: No lesions  Access: Left AVF    Basic Metabolic Panel: Recent Labs  Lab 07/01/17 0856 07/01/17 1504  NA 128*  --   K 7.4* 3.7  CL 91*  --   CO2 25  --   GLUCOSE 115*  --   BUN 53*  --   CREATININE 7.74*  --   CALCIUM 8.9  --     Liver Function Tests: Recent Labs  Lab 07/01/17 0856  AST 17  ALT 7*  ALKPHOS 150*  BILITOT 0.7  PROT 7.3   ALBUMIN 3.6   No results for input(s): LIPASE, AMYLASE in the last 168 hours. No results for input(s): AMMONIA in the last 168 hours.  CBC: Recent Labs  Lab 07/01/17 0856  WBC 7.7  HGB 10.8*  HCT 33.4*  MCV 90.3  PLT 255    Cardiac Enzymes: No results for input(s): CKTOTAL, CKMB, CKMBINDEX, TROPONINI in the last 168 hours.  BNP: Invalid input(s): POCBNP  CBG: Recent Labs  Lab 07/01/17 0915 07/01/17 1206  GLUCAP 101* 86    Microbiology: Results for orders placed or performed during the hospital encounter of 05/15/17  Blood Culture (routine x 2)     Status: None   Collection Time: 05/15/17  2:01 AM  Result Value Ref Range Status   Specimen Description BLOOD LEFT HAND  Final   Special Requests   Final    BOTTLES DRAWN AEROBIC AND ANAEROBIC Blood Culture adequate volume   Culture   Final    NO GROWTH 5 DAYS Performed at [facility], [address], [place], [place] [address]    Report Status 05/20/2017 FINAL  Final  Blood Culture (routine x 2)     Status: None   Collection Time: 05/15/17  2:01 AM  Result Value Ref Range Status   Specimen Description BLOOD RIGHT FA  Final   Special Requests   Final    BOTTLES DRAWN AEROBIC  AND ANAEROBIC Blood Culture adequate volume   Culture   Final    NO GROWTH 5 DAYS Performed at [facility], [address] [place], [place] [address]    Report Status 05/20/2017 FINAL  Final  MRSA PCR Screening     Status: Abnormal   Collection Time: 05/15/17  5:35 AM  Result Value Ref Range Status   MRSA by PCR POSITIVE (A) NEGATIVE Final    Comment:        The GeneXpert MRSA Assay (FDA approved for NASAL specimens only), is one component of a comprehensive MRSA colonization surveillance program. It is not intended to diagnose MRSA infection nor to guide or monitor treatment for MRSA infections. RESULT CALLED TO, READ BACK BY AND VERIFIED WITH: AMY DALTON AT 0802 ON 05/15/17 [facility]. Performed at [facility], [address] [place], [place] [address]     Coagulation Studies: No results for input(s): LABPROT, INR in the last 72 hours.  Urinalysis: Recent Labs    07/01/17 1107  COLORURINE YELLOW*  LABSPEC 1.013  PHURINE 7.0  GLUCOSEU NEGATIVE  HGBUR NEGATIVE  BILIRUBINUR NEGATIVE  KETONESUR NEGATIVE  PROTEINUR >=300*  NITRITE NEGATIVE  LEUKOCYTESUR NEGATIVE      Imaging: Dg Chest Portable 1 View  Result Date: 07/01/2017 CLINICAL DATA:  Weakness. EXAM: PORTABLE CHEST 1 VIEW COMPARISON:  05/15/2017 FINDINGS: There is moderate cardiac enlargement. No pleural effusion or edema identified. No airspace opacities identified. Interval clearing of previous right upper lobe consolidation. IMPRESSION: 1. No acute cardiopulmonary abnormalities. 2. Cardiac enlargement. Electronically Signed   By: Signa Kell M.D.   On: 07/01/2017 09:22     Medications:    . insulin aspart  0-9 Units Subcutaneous TID WC     Assessment/ Plan:  [name] is a 52 y.o. white female with end stage renal disease on hemodialysis, COPD/asthma, diabetes mellitus type II, congestive heart failure, coronary artery disease.  [facility]/ [facility]/ MWF  1. End Stage Renal Disease with hyperkalemia, hyponatremia and anion gap metabolic acidosis : emergent hemodialysis treatment. Tolerating treatment well.  - Resume MWF schedule  2. Hypertension: hypotensive on hemodialysis treatment. Holding home blood pressure medications.   3. Anemia of chronic kidney disease: hemoglobin 10.8 - EPO with HD treatment as outpatient.  4. Secondary Hyperparathyroidism: calcium at goal. Not currently on a binder.    LOS: 0 Kimberly Montgomery 4/2/20194:13 PM

## 2017-07-01 NOTE — ED Triage Notes (Signed)
To ER via [facility] from [facility] c/o generalized body aches that began yesterday and generalized weakness X 1 day. Pt missed dialysis yesterday due to pain. Pt alert and oriented to self, situation and place, disoriented to time. Pt states that chest, abdomen and legs hurt.

## 2017-07-02 LAB — CBC
HEMATOCRIT: 34.4 % — AB (ref 35.0–47.0)
Hemoglobin: 11 g/dL — ABNORMAL LOW (ref 12.0–16.0)
MCH: 29 pg (ref 26.0–34.0)
MCHC: 31.9 g/dL — ABNORMAL LOW (ref 32.0–36.0)
MCV: 90.9 fL (ref 80.0–100.0)
PLATELETS: 215 10*3/uL (ref 150–440)
RBC: 3.78 MIL/uL — ABNORMAL LOW (ref 3.80–5.20)
RDW: 15.1 % — ABNORMAL HIGH (ref 11.5–14.5)
WBC: 4.7 10*3/uL (ref 3.6–11.0)

## 2017-07-02 LAB — BASIC METABOLIC PANEL
ANION GAP: 9 (ref 5–15)
BUN: 33 mg/dL — AB (ref 6–20)
CHLORIDE: 95 mmol/L — AB (ref 101–111)
CO2: 32 mmol/L (ref 22–32)
Calcium: 8.5 mg/dL — ABNORMAL LOW (ref 8.9–10.3)
Creatinine, Ser: 5.76 mg/dL — ABNORMAL HIGH (ref 0.44–1.00)
GFR calc Af Amer: 9 mL/min — ABNORMAL LOW (ref >60)
GFR calc non Af Amer: 8 mL/min — ABNORMAL LOW (ref >60)
Glucose, Bld: 84 mg/dL (ref 65–99)
POTASSIUM: 4.8 mmol/L (ref 3.5–5.1)
SODIUM: 136 mmol/L (ref 135–145)

## 2017-07-02 LAB — GLUCOSE, CAPILLARY
GLUCOSE-CAPILLARY: 118 mg/dL — AB (ref 65–99)
GLUCOSE-CAPILLARY: 106 mg/dL — AB (ref 65–99)
Glucose-Capillary: 83 mg/dL (ref 65–99)

## 2017-07-02 MED ORDER — HYDRALAZINE HCL 20 MG/ML IJ SOLN
10.0000 mg | Freq: Once | INTRAMUSCULAR | Status: DC
Start: 2017-07-02 — End: 2017-07-03
  Filled 2017-07-02: qty 1

## 2017-07-02 MED ORDER — DIPHENHYDRAMINE HCL 50 MG/ML IJ SOLN
25.0000 mg | Freq: Once | INTRAMUSCULAR | Status: AC
  Administered 2017-07-02: 25 mg via INTRAVENOUS

## 2017-07-02 NOTE — Discharge Summary (Signed)
[facility] [place] at [facility]   PATIENT NAME: Kimberly Montgomery    MR#:  [id number]  DATE OF BIRTH:  1965/08/05  DATE OF ADMISSION:  07/01/2017 ADMITTING PHYSICIAN: Enedina Finner, MD  DATE OF DISCHARGE: 07/02/2017  PRIMARY CARE PHYSICIAN: Dorothey Baseman, MD    ADMISSION DIAGNOSIS:  Acute hyperkalemia [E87.5]  DISCHARGE DIAGNOSIS:  Acute hyperkalemia secondary to missing dialysis resolved  SECONDARY DIAGNOSIS:   Past Medical History:  Diagnosis Date  . Anginal pain (HCC)   . Anxiety   . Bipolar disorder (HCC)   . CAD (coronary artery disease)   . CHF (congestive heart failure) (HCC)   . Chronic lower back pain   . Depression   . Endometriosis   . ESRD (end stage renal disease) on dialysis (HCC)    "[facility]; [address]; [place]; TTS" (01/19/2016)  . Gastroparesis   . GERD (gastroesophageal reflux disease)   . History of blood transfusion "several"   "my blood would get low; low RBC"  . History of hiatal hernia   . HLD (hyperlipidemia)   . Hypertension   . Migraine    "monthly" (01/19/2016)  . Myocardial infarction (HCC) 2017   "~ 3 wks ago" (01/19/2016)  . Renal disorder   . Renal insufficiency   . Seizures (HCC) 07/2015   "I've only had the 1; don't know what from" (01/19/2016)  . Stroke (HCC)   . Type II diabetes mellitus [facility])     HOSPITAL COURSE:   Kimberly Montgomery  is a 52 y.o. female with a known history of end-stage renal disease on hemodialysis, bipolar disorder/anxiety, CAD, hypertension, diabetes comes to the emergency room with generalized body aches/myalgia fatigue and fever at home. She missed her dialysis yesterday.  Potassium today 7.4.  EKG does not show any peaked T waves.  1.  Acute hyperkalemia secondary to missing hemodialysis -Underwent urgent hemodialysis.  No peak T waves in EKG -See him now back to baseline -Appreciate Dr. Alice Rieger input  2.  Malignant hypertension continue home meds -PRN hydralazine if  needed  3.  Type 2 diabetes -Siding scale insulin, oral meds and insulin home dose  4.  Bipolar disorder/anxiety -Continue psych meds  5.  DVT prophylaxis subcu heparin  6.  Myalgias, fatigue -Influenza PCR negative -Treat symptomatically  Overall stable.  Patient feels a lot better today.  Discharged home after dialysis.   CONSULTS OBTAINED:    DRUG ALLERGIES:   Allergies  Allergen Reactions  . Cephalosporins Anaphylaxis    Patient has tolerated meropenem.   [name] Penicillins Anaphylaxis and Other (See Comments)    Has patient had a PCN reaction causing immediate rash, facial/tongue/throat swelling, SOB or lightheadedness with hypotension: Yes Has patient had a PCN reaction causing severe rash involving mucus membranes or skin necrosis: No Has patient had a PCN reaction that required hospitalization No Has patient had a PCN reaction occurring within the last 10 years: No If all of the above answers are "NO", then may proceed with Cephalosporin use.  . Reglan [Metoclopramide] Other (See Comments)    Severe EPS (lip, head, body tremors) March 2018  . Risperdal [Risperidone] Other (See Comments)    Severe EPS (lip, head, body tremors) March 2018  . Lamictal [Lamotrigine] Other (See Comments)    Reaction:  Hallucinations  . Phenergan [Promethazine Hcl] Nausea And Vomiting    Sensitivity to medicine  . Pravastatin Other (See Comments)    Reaction:  Muscle pain   . Sulfa Antibiotics Other (See  Comments)    Reaction:  Unknown     DISCHARGE MEDICATIONS:   Allergies as of 07/02/2017      Reactions   Cephalosporins Anaphylaxis   Patient has tolerated meropenem.    Penicillins Anaphylaxis, Other (See Comments)   Has patient had a PCN reaction causing immediate rash, facial/tongue/throat swelling, SOB or lightheadedness with hypotension: Yes Has patient had a PCN reaction causing severe rash involving mucus membranes or skin necrosis: No Has patient had a PCN reaction that  required hospitalization No Has patient had a PCN reaction occurring within the last 10 years: No If all of the above answers are "NO", then may proceed with Cephalosporin use.   Reglan [metoclopramide] Other (See Comments)   Severe EPS (lip, head, body tremors) March 2018   Risperdal [risperidone] Other (See Comments)   Severe EPS (lip, head, body tremors) March 2018   Lamictal [lamotrigine] Other (See Comments)   Reaction:  Hallucinations   Phenergan [promethazine Hcl] Nausea And Vomiting   Sensitivity to medicine   Pravastatin Other (See Comments)   Reaction:  Muscle pain    Sulfa Antibiotics Other (See Comments)   Reaction:  Unknown       Medication List    STOP taking these medications   oseltamivir 30 MG capsule Commonly known as:  TAMIFLU     TAKE these medications   acetaminophen 500 MG tablet Commonly known as:  TYLENOL Take 500 mg by mouth every 6 (six) hours as needed for moderate pain or fever.   amLODipine 10 MG tablet Commonly known as:  NORVASC Take 10 mg by mouth daily.   aspirin 81 MG chewable tablet Chew 81 mg by mouth daily.   atorvastatin 40 MG tablet Commonly known as:  LIPITOR Take 40 mg by mouth daily at 6 PM.   benztropine 0.5 MG tablet Commonly known as:  COGENTIN Take 0.5 mg by mouth 2 (two) times daily.   bisacodyl 10 MG suppository Commonly known as:  DULCOLAX Place 10 mg rectally as needed for moderate constipation.   clonazePAM 0.5 MG tablet Commonly known as:  KLONOPIN Take  tablet (0.25MG ) by mouth every morning and 1 tablet (0.5MG ) by mouth every evening   clopidogrel 75 MG tablet Commonly known as:  PLAVIX Take 75 mg by mouth daily at 12 noon.   escitalopram 5 MG tablet Commonly known as:  LEXAPRO Take 2.5 mg by mouth daily.   fluticasone 50 MCG/ACT nasal spray Commonly known as:  FLONASE Place 1 spray into both nostrils daily.   gabapentin 300 MG capsule Commonly known as:  NEURONTIN Take 300 mg by mouth daily.    hydrALAZINE 50 MG tablet Commonly known as:  APRESOLINE Take 1 tablet (50MG ) by mouth three times daily (Sunday, Tuesday, Thursday & Saturday) and take 1 tablet (50MG ) by mouth twice daily (Monday, Wednesday & Friday).   HYDROcodone-acetaminophen 5-325 MG tablet Commonly known as:  NORCO/VICODIN Take 1 tablet by mouth every 6 (six) hours as needed for moderate pain or severe pain.   insulin aspart 100 UNIT/ML injection Commonly known as:  novoLOG Inject 7 Units into the skin 3 (three) times daily with meals.   isosorbide mononitrate 30 MG 24 hr tablet Commonly known as:  IMDUR Take 90 mg by mouth daily at 12 noon.   lactulose 10 GM/15ML solution Commonly known as:  CHRONULAC Take 10 g by mouth daily at 6 PM.   LEVEMIR 100 UNIT/ML injection Generic drug:  insulin detemir Inject 4 Units into the  skin daily.   levETIRAcetam 750 MG tablet Commonly known as:  KEPPRA Take 2 tablets (1,500 mg total) by mouth daily at 12 noon.   lidocaine 5 % Commonly known as:  LIDODERM Place 1 patch onto the skin at bedtime.   lidocaine 4 % cream Commonly known as:  LMX Apply 1 application topically daily as needed (pain). To right arm fistula   linagliptin 5 MG Tabs tablet Commonly known as:  TRADJENTA Take 5 mg by mouth daily.   lisinopril 10 MG tablet Commonly known as:  PRINIVIL,ZESTRIL Take 10 mg by mouth daily at 12 noon.   Melatonin 3 MG Tabs Take 3 mg by mouth at bedtime.   metoprolol tartrate 50 MG tablet Commonly known as:  LOPRESSOR Take 1 tablet (50 mg total) by mouth 2 (two) times daily.   multivitamin Tabs tablet Take 1 tablet by mouth daily.   nitroGLYCERIN 0.4 MG SL tablet Commonly known as:  NITROSTAT Place 0.4 mg under the tongue every 5 (five) minutes as needed for chest pain. Reported on 07/10/2015   omeprazole 20 MG capsule Commonly known as:  PRILOSEC Take 20 mg by mouth 2 (two) times daily.   ondansetron 4 MG disintegrating tablet Commonly known as:   ZOFRAN ODT Take 1 tablet (4 mg total) by mouth every 6 (six) hours as needed for nausea or vomiting.   OXcarbazepine 150 MG tablet Commonly known as:  TRILEPTAL Take 150 mg by mouth at bedtime.   polyethylene glycol packet Commonly known as:  MIRALAX / GLYCOLAX Take 17 g by mouth daily. What changed:  when to take this   REFRESH 1 % ophthalmic solution Generic drug:  carboxymethylcellulose Place 1 drop into both eyes 3 (three) times daily.   senna 8.6 MG Tabs tablet Commonly known as:  SENOKOT Take 2 tablets by mouth 2 (two) times daily.   sevelamer carbonate 800 MG tablet Commonly known as:  RENVELA Take 800 mg by mouth 4 (four) times daily -  before meals and at bedtime.   torsemide 100 MG tablet Commonly known as:  DEMADEX Take 100 mg by mouth daily.       If you experience worsening of your admission symptoms, develop shortness of breath, life threatening emergency, suicidal or homicidal thoughts you must seek medical attention immediately by calling 911 or calling your MD immediately  if symptoms less severe.  You Must read complete instructions/literature along with all the possible adverse reactions/side effects for all the Medicines you take and that have been prescribed to you. Take any new Medicines after you have completely understood and accept all the possible adverse reactions/side effects.   Please note  You were cared for by a hospitalist during your hospital stay. If you have any questions about your discharge medications or the care you received while you were in the hospital after you are discharged, you can call the unit and asked to speak with the hospitalist on call if the hospitalist that took care of you is not available. Once you are discharged, your primary care physician will handle any further medical issues. Please note that NO REFILLS for any discharge medications will be authorized once you are discharged, as it is imperative that you return to your  primary care physician (or establish a relationship with a primary care physician if you do not have one) for your aftercare needs so that they can reassess your need for medications and monitor your lab values. Today   SUBJECTIVE   Patient  feels a lot better.  No myalgias.  VITAL SIGNS:  Blood pressure (!) 157/56, pulse 62, temperature 98.2 F (36.8 C), temperature source Oral, resp. rate 16, height 5\' 9"  (1.753 m), weight 104.3 kg (229 lb 15 oz), last menstrual period 03/16/2015, SpO2 93 %.  I/O:    Intake/Output Summary (Last 24 hours) at 07/02/2017 1311 Last data filed at 07/02/2017 1041 Gross per 24 hour  Intake 480 ml  Output 2550 ml  Net -2070 ml    PHYSICAL EXAMINATION:  GENERAL:  52 y.o.-year-old patient lying in the bed with no acute distress.  EYES: Pupils equal, round, reactive to light and accommodation. No scleral icterus. Extraocular muscles intact.  HEENT: Head atraumatic, normocephalic. Oropharynx and nasopharynx clear.  NECK:  Supple, no jugular venous distention. No thyroid enlargement, no tenderness.  LUNGS: Normal breath sounds bilaterally, no wheezing, rales,rhonchi or crepitation. No use of accessory muscles of respiration.  CARDIOVASCULAR: S1, S2 normal. No murmurs, rubs, or gallops.  ABDOMEN: Soft, non-tender, non-distended. Bowel sounds present. No organomegaly or mass.  EXTREMITIES: No pedal edema, cyanosis, or clubbing.  Right below-knee amputation NEUROLOGIC: Cranial nerves II through XII are intact. Muscle strength 5/5 in all extremities. Sensation intact. Gait not checked.  PSYCHIATRIC: The patient is alert and oriented x 3.  SKIN: No obvious rash, lesion, or ulcer.   DATA REVIEW:   CBC  Recent Labs  Lab 07/01/17 0856  WBC 7.7  HGB 10.8*  HCT 33.4*  PLT 255    Chemistries  Recent Labs  Lab 07/01/17 0856  07/02/17 0616  NA 128*  --  136  K 7.4*   < > 4.8  CL 91*  --  95*  CO2 25  --  32  GLUCOSE 115*  --  84  BUN 53*  --  33*   CREATININE 7.74*  --  5.76*  CALCIUM 8.9  --  8.5*  AST 17  --   --   ALT 7*  --   --   ALKPHOS 150*  --   --   BILITOT 0.7  --   --    < > = values in this interval not displayed.    Microbiology Results   Recent Results (from the past 240 hour(s))  MRSA PCR Screening     Status: None   Collection Time: 07/01/17  4:56 PM  Result Value Ref Range Status   MRSA by PCR NEGATIVE NEGATIVE Final    Comment:        The GeneXpert MRSA Assay (FDA approved for NASAL specimens only), is one component of a comprehensive MRSA colonization surveillance program. It is not intended to diagnose MRSA infection nor to guide or monitor treatment for MRSA infections. Performed at [facility], [address] [place], [place] [address]     RADIOLOGY:  Dg Chest Portable 1 View  Result Date: 07/01/2017 CLINICAL DATA:  Weakness. EXAM: PORTABLE CHEST 1 VIEW COMPARISON:  05/15/2017 FINDINGS: There is moderate cardiac enlargement. No pleural effusion or edema identified. No airspace opacities identified. Interval clearing of previous right upper lobe consolidation. IMPRESSION: 1. No acute cardiopulmonary abnormalities. 2. Cardiac enlargement. Electronically Signed   By: Signa Kell M.D.   On: 07/01/2017 09:22     Management plans discussed with the patient, family and they are in agreement.  CODE STATUS:     Code Status Orders  (From admission, onward)        Start     Ordered   07/01/17 1655  Full code  Continuous     07/01/17 1654    Code Status History    Date Active Date Inactive Code Status Order ID Comments User Context   05/15/2017 0540 05/21/2017 1406 Full Code [id number]  Ihor Austin, MD Inpatient   05/31/2016 1606 06/11/2016 2013 Full Code [id number]  Kathlene Cote, PA-C ED   03/13/2016 2236 03/16/2016 2033 Full Code [id number]  Oralia Manis, MD ED   02/25/2016 1758 02/27/2016 2332 Full Code [id number]  Shaune Pollack, MD Inpatient   01/19/2016 0454 01/20/2016 2103 Full  Code [id number]  Hillary Bow, DO ED   12/25/2015 2001 01/04/2016 2024 Full Code [id number]  Houston Siren, MD Inpatient   11/19/2015 0117 11/21/2015 1956 Full Code [id number]  Hugelmeyer, Felsenthal, DO Inpatient   10/06/2015 0208 10/06/2015 1832 Full Code [id number]  Ihor Austin, MD Inpatient   09/13/2015 0214 09/15/2015 1728 Full Code [id number]  Arnaldo Natal, MD Inpatient   08/24/2015 0350 08/31/2015 1847 Full Code [id number]  Ihor Austin, MD Inpatient   07/16/2015 0626 08/02/2015 1848 Full Code [id number]  Ihor Austin, MD ED   07/10/2015 0149 07/12/2015 1301 Full Code [id number]  Oralia Manis, MD Inpatient   05/12/2015 1546 05/16/2015 1735 Full Code [id number]  Auburn Bilberry, MD ED    Advance Directive Documentation     Most Recent Value  Type of Advance Directive  Out of facility DNR (pink MOST or yellow form)  Pre-existing out of facility DNR order (yellow form or pink MOST form)  -  "MOST" Form in Place?  -      TOTAL TIME TAKING CARE OF THIS PATIENT: *40* minutes.    Enedina Finner M.D on 07/02/2017 at 1:11 PM  Between 7am to 6pm - Pager - [phone] After 6pm go to [email or web address] - Social research officer, government  [facility]  Office  [phone]  CC: Primary care physician; Dorothey Baseman, MD

## 2017-07-02 NOTE — Progress Notes (Signed)
Pt report has been called to [facility]. Education and care plans completed.

## 2017-07-02 NOTE — Clinical Social Work Note (Signed)
Clinical Social Work Assessment  Patient Details  Name: Kimberly Montgomery MRN: [id number] Date of Birth: 1965/06/12  Date of referral:  07/02/17               Reason for consult:  Discharge Planning                Permission sought to share information with:  Family Supports Permission granted to share information::  Yes, Verbal Permission Granted  Name::        Agency::     Relationship::     Contact Information:     Housing/Transportation Living arrangements for the past 2 months:  Skilled Nursing Facility Source of Information:  Adult Children Patient Interpreter Needed:  None Criminal Activity/Legal Involvement Pertinent to Current Situation/Hospitalization:  No - Comment as needed Significant Relationships:  Adult Children Lives with:  Facility Resident Do you feel safe going back to the place where you live?  Yes Need for family participation in patient care:  Yes (Comment)  Care giving concerns:  Patient is a long term resident at [facility].   Social Worker assessment / plan:  CSW noted that patient's discharge summary was in and that patient is possibly to get dialysis prior to discharge today. CSW contacted patient's daughter who was aware that patient was to return to [facility] today. CSW notified Jomarie Longs at [facility] and discharge information sent. Nurse to call report. Patient will be a late discharge if she is to get dialysis first.  Employment status:  Disabled (Comment on whether or not currently receiving Disability) Insurance information:  Managed Medicare PT Recommendations:    Information / Referral to community resources:     Patient/Family's Response to care:  [name] expressed appreciation for CSW call.  Patient/Family's Understanding of and Emotional Response to Diagnosis, Current Treatment, and Prognosis:  [name] is involved in patient's treatment plan.  Emotional Assessment Appearance:    Attitude/Demeanor/Rapport:    Affect (typically  observed):    Orientation:    Alcohol / Substance use:  Not Applicable Psych involvement (Current and /or in the community):  No (Comment)  Discharge Needs  Concerns to be addressed:  Care Coordination Readmission within the last 30 days:  No Current discharge risk:  None Barriers to Discharge:  No Barriers Identified   York Spaniel, LCSW 07/02/2017, 2:55 PM

## 2017-07-02 NOTE — Discharge Instructions (Signed)
Resume your hemodialysis on your outpatient schedule as before

## 2017-07-02 NOTE — Progress Notes (Signed)
[facility]  ROUNDING NOTE   Subjective:   Emergent hemodialysis treatment yesterday for hyperkalemia. Tolerated treatment well. UF of .   K this morning of 3.7  Scheduled for hemodialysis today.   Objective:  Vital signs in last 24 hours:  Temp:  [98.1 F (36.7 C)-98.4 F (36.9 C)] 98.4 F (36.9 C) (04/03 1328) Pulse Rate:  [62-86] 66 (04/03 1328) Resp:  [10-20] 16 (04/03 1328) BP: (98-190)/(45-70) 179/60 (04/03 1328) SpO2:  [92 %-100 %] 92 % (04/03 1328)  Weight change:  Filed Weights   07/01/17 0851 07/01/17 1228  Weight: 104.3 kg (230 lb) 104.3 kg (229 lb 15 oz)    Intake/Output: I/O last 3 completed shifts: In: 240 [P.O.:240] Out: 2550 [Other:2550]   Intake/Output this shift:  Total I/O In: 480 [P.O.:480] Out: -   Physical Exam: General: NAD,   Head: Normocephalic, atraumatic. Moist oral mucosal membranes  Eyes: Anicteric, PERRL  Neck: Supple, trachea midline  Lungs:  Clear to auscultation  Heart: Regular rate and rhythm  Abdomen:  Soft, nontender,   Extremities: no peripheral edema.  Neurologic: Nonfocal, moving all four extremities  Skin: No lesions  Access: Left AVF    Basic Metabolic Panel: Recent Labs  Lab 07/01/17 0856 07/01/17 1504 07/02/17 0616  NA 128*  --  136  K 7.4* 3.7 4.8  CL 91*  --  95*  CO2 25  --  32  GLUCOSE 115*  --  84  BUN 53*  --  33*  CREATININE 7.74*  --  5.76*  CALCIUM 8.9  --  8.5*    Liver Function Tests: Recent Labs  Lab 07/01/17 0856  AST 17  ALT 7*  ALKPHOS 150*  BILITOT 0.7  PROT 7.3  ALBUMIN 3.6   No results for input(s): LIPASE, AMYLASE in the last 168 hours. No results for input(s): AMMONIA in the last 168 hours.  CBC: Recent Labs  Lab 07/01/17 0856  WBC 7.7  HGB 10.8*  HCT 33.4*  MCV 90.3  PLT 255    Cardiac Enzymes: No results for input(s): CKTOTAL, CKMB, CKMBINDEX, TROPONINI in the last 168 hours.  BNP: Invalid input(s): POCBNP  CBG: Recent Labs  Lab  07/01/17 1206 07/01/17 1653 07/01/17 2107 07/02/17 0737 07/02/17 1141  GLUCAP 86 71 97 83 118*    Microbiology: Results for orders placed or performed during the hospital encounter of 07/01/17  MRSA PCR Screening     Status: None   Collection Time: 07/01/17  4:56 PM  Result Value Ref Range Status   MRSA by PCR NEGATIVE NEGATIVE Final    Comment:        The GeneXpert MRSA Assay (FDA approved for NASAL specimens only), is one component of a comprehensive MRSA colonization surveillance program. It is not intended to diagnose MRSA infection nor to guide or monitor treatment for MRSA infections. Performed at [facility], [address] [place], [place] [address]     Coagulation Studies: No results for input(s): LABPROT, INR in the last 72 hours.  Urinalysis: Recent Labs    07/01/17 1107  COLORURINE YELLOW*  LABSPEC 1.013  PHURINE 7.0  GLUCOSEU NEGATIVE  HGBUR NEGATIVE  BILIRUBINUR NEGATIVE  KETONESUR NEGATIVE  PROTEINUR >=300*  NITRITE NEGATIVE  LEUKOCYTESUR NEGATIVE      Imaging: Dg Chest Portable 1 View  Result Date: 07/01/2017 CLINICAL DATA:  Weakness. EXAM: PORTABLE CHEST 1 VIEW COMPARISON:  05/15/2017 FINDINGS: There is moderate cardiac enlargement. No pleural effusion or edema identified. No airspace  opacities identified. Interval clearing of previous right upper lobe consolidation. IMPRESSION: 1. No acute cardiopulmonary abnormalities. 2. Cardiac enlargement. Electronically Signed   By: Signa Kell M.D.   On: 07/01/2017 09:22     Medications:    . amLODipine  10 mg Oral Daily  . aspirin  81 mg Oral Daily  . atorvastatin  40 mg Oral q1800  . benztropine  0.5 mg Oral BID  . clopidogrel  75 mg Oral Q1200  . escitalopram  2.5 mg Oral Daily  . fluticasone  1 spray Each Nare Daily  . gabapentin  300 mg Oral Daily  . heparin  5,000 Units Subcutaneous Q8H  . hydrALAZINE  50 mg Oral 3 times per day on Sun Tue Thu Sat  . hydrALAZINE  50 mg  Oral 2 times per day on Mon Wed Fri  . insulin aspart  0-9 Units Subcutaneous TID WC  . insulin aspart  7 Units Subcutaneous TID WC  . insulin detemir  4 Units Subcutaneous Daily  . isosorbide mononitrate  90 mg Oral Q1200  . lactulose  10 g Oral q1800  . levETIRAcetam  1,500 mg Oral Q1200  . linagliptin  5 mg Oral Daily  . lisinopril  10 mg Oral Q1200  . Melatonin  5 mg Oral QHS  . metoprolol tartrate  50 mg Oral BID  . multivitamin with minerals  1 tablet Oral Daily  . OXcarbazepine  150 mg Oral QHS  . pantoprazole  40 mg Oral Daily  . polyethylene glycol  17 g Oral Q1200  . sevelamer carbonate  800 mg Oral TID AC & HS  . torsemide  100 mg Oral Daily     Assessment/ Plan:  [name] is a 52 y.o. white female with end stage renal disease on hemodialysis, COPD/asthma, diabetes mellitus type II, congestive heart failure, coronary artery disease.  [facility]/ [address]/ MWF 106kg Right AVF  1. End Stage Renal Disease with hyperkalemia, hyponatremia and anion gap metabolic acidosis : emergent hemodialysis on admission. Tolerated treatment well. Potassium back at goal.  - Resume MWF schedule. Dialysis for later today.   2. Hypertension: blood pressure elevated. Home regimen of amlodipine, hydralazine, imdur, lisinopril, and torsemide.   3. Anemia of chronic kidney disease: hemoglobin 10.8 - EPO with HD treatment as outpatient.  4. Secondary Hyperparathyroidism: calcium, phosphorus and PTH at goal. Not currently on a binder.    LOS: 1 Kimberly Montgomery 4/3/20193:45 PM

## 2017-07-02 NOTE — Progress Notes (Signed)
Pt tolerated tx well without c/o or complications. Net UF 1986, goal met.    07/02/17 2136  Vital Signs  Temp 98.7 F (37.1 C)  Temp Source Oral  Pulse Rate 85  Pulse Rate Source Monitor  Resp 17  BP (!) 168/53  BP Location Left Arm  BP Method Automatic  Patient Position (if appropriate) Lying  Oxygen Therapy  SpO2 95 %  O2 Device Room Air  Dialysis Weight  Weight 105.8 kg (233 lb 4 oz)  Type of Weight Post-Dialysis  Post-Hemodialysis Assessment  Rinseback Volume (mL) 250 mL  KECN 80.7 V  Dialyzer Clearance Lightly streaked  Duration of HD Treatment -hour(s) 3.5 hour(s)  Hemodialysis Intake (mL) 500 mL  UF Total -Machine (mL) 2486 mL  Net UF (mL) 1986 mL  Tolerated HD Treatment Yes  AVG/AVF Arterial Site Held (minutes) 10 minutes  AVG/AVF Venous Site Held (minutes) 10 minutes  Education / Care Plan  Dialysis Education Provided Yes  Documented Education in Care Plan Yes  Fistula / Graft Right Upper arm Arteriovenous fistula  Placement Date/Time: 08/23/16 0915   Orientation: Right  Access Location: Upper arm  Access Type: Arteriovenous fistula  Site Condition No complications  Fistula / Graft Assessment Present;Thrill;Bruit  Status Deaccessed  Drainage Description None

## 2017-07-02 NOTE — Progress Notes (Signed)
HD tx start   07/02/17 1745  Vital Signs  Pulse Rate 65  Pulse Rate Source Monitor  Resp 20  BP (!) 160/65  BP Location Left Arm  BP Method Automatic  Patient Position (if appropriate) Lying  Oxygen Therapy  SpO2 92 %  O2 Device Room Air  During Hemodialysis Assessment  Blood Flow Rate (mL/min) 400 mL/min  Arterial Pressure (mmHg) -130 mmHg  Venous Pressure (mmHg) 190 mmHg  Transmembrane Pressure (mmHg) 60 mmHg  Ultrafiltration Rate (mL/min) 710 mL/min  Dialysate Flow Rate (mL/min) 800 ml/min  Conductivity: Machine  13.8  HD Safety Checks Performed Yes  Dialysis Fluid Bolus Normal Saline  Bolus Amount (mL) 250 mL  Intra-Hemodialysis Comments Tx initiated  Fistula / Graft Right Upper arm Arteriovenous fistula  Placement Date/Time: 08/23/16 0915   Orientation: Right  Access Location: Upper arm  Access Type: Arteriovenous fistula  Status Accessed  Needle Size 15

## 2017-07-02 NOTE — Progress Notes (Signed)
Post HD assessment    07/02/17 2138  Neurological  Level of Consciousness Alert  Orientation Level Oriented X4  Respiratory  Respiratory Pattern Regular;Unlabored  Chest Assessment Chest expansion symmetrical  Cardiac  Pulse Regular  ECG Monitor Yes  Vascular  R Radial Pulse +2  L Radial Pulse +2  Integumentary  Integumentary (WDL) X  Skin Color Appropriate for ethnicity  Musculoskeletal  Musculoskeletal (WDL) X  Generalized Weakness Yes  Assistive Device None  Gastrointestinal  Bowel Sounds Assessment Active  GU Assessment  Genitourinary (WDL) X  Genitourinary Symptoms  (HD)  Psychosocial  Psychosocial (WDL) WDL

## 2017-07-02 NOTE — Progress Notes (Signed)
Pre HD assessment    07/02/17 1731  Vital Signs  Temp 98.8 F (37.1 C)  Temp Source Oral  Pulse Rate 66  Pulse Rate Source Monitor  Resp 18  BP (!) 181/65  BP Location Left Arm  BP Method Automatic  Patient Position (if appropriate) Lying  Oxygen Therapy  SpO2 93 %  O2 Device Room Air  Pain Assessment  Pain Scale 0-10  Pain Score 0  Dialysis Weight  Weight 107.1 kg (236 lb 1.8 oz)  Type of Weight Pre-Dialysis  Time-Out for Hemodialysis  What Procedure? HD  Pt Identifiers(min of two) First/Last Name;MRN/Account#  Correct Site? Yes  Correct Side? Yes  Correct Procedure? Yes  Consents Verified? Yes  Rad Studies Available? N/A  Safety Precautions Reviewed? Yes  Biochemist, clinical Number  (4A)  Station Number 1  UF/Alarm Test Passed  Conductivity: Meter 13.8  Conductivity: Machine  14  pH 7.6  Reverse Osmosis main  Normal Saline Lot Number [id number]  Dialyzer Lot Number [id number]  Disposable Set Lot Number [id number]  Machine Temperature 98.6 F (37 C)  Immunologist and Audible Yes  Blood Lines Intact and Secured Yes  Pre Treatment Patient Checks  Vascular access used during treatment Fistula  Hepatitis B Surface Antigen Results Negative  Date Hepatitis B Surface Antigen Drawn 06/01/16  Hepatitis B Surface Antibody 28  Date Hepatitis B Surface Antibody Drawn 01/06/17  Hemodialysis Consent Verified Yes  Hemodialysis Standing Orders Initiated Yes  ECG (Telemetry) Monitor On Yes  Prime Ordered Normal Saline  Length of  DialysisTreatment -hour(s) 3.5 Hour(s)  Dialyzer Elisio 17H NR  Dialysate 2K, 2.5 Ca  Dialysis Anticoagulant None  Dialysate Flow Ordered 800  Blood Flow Rate Ordered 400 mL/min  Ultrafiltration Goal 2 Liters  Pre Treatment Labs CBC  Dialysis Blood Pressure Support Ordered Normal Saline  Education / Care Plan  Dialysis Education Provided Yes  Documented Education in Care Plan Yes  Fistula / Graft Right Upper arm Arteriovenous fistula   Placement Date/Time: 08/23/16 0915   Orientation: Right  Access Location: Upper arm  Access Type: Arteriovenous fistula  Site Condition No complications  Fistula / Graft Assessment Present;Thrill;Bruit  Drainage Description None

## 2017-07-02 NOTE — Progress Notes (Signed)
HD tx end   07/02/17 2125  Vital Signs  Pulse Rate 84  Pulse Rate Source Monitor  Resp (!) 21  BP (!) 164/51  BP Location Left Arm  BP Method Automatic  Patient Position (if appropriate) Lying  Oxygen Therapy  SpO2 93 %  O2 Device Room Air  During Hemodialysis Assessment  Dialysis Fluid Bolus Normal Saline  Bolus Amount (mL) 250 mL  Intra-Hemodialysis Comments Tx completed

## 2017-07-02 NOTE — Progress Notes (Signed)
Pre HD assessment    07/02/17 1746  Neurological  Level of Consciousness Alert  Orientation Level Oriented X4  Respiratory  Respiratory Pattern Regular;Unlabored  Chest Assessment Chest expansion symmetrical  Cardiac  Pulse Regular  ECG Monitor Yes  Vascular  R Radial Pulse +2  L Radial Pulse +2  Integumentary  Integumentary (WDL) X  Skin Color Appropriate for ethnicity  Musculoskeletal  Musculoskeletal (WDL) X  Generalized Weakness Yes  Assistive Device None  Gastrointestinal  Bowel Sounds Assessment Active  GU Assessment  Genitourinary (WDL) X  Genitourinary Symptoms  (HD)  Psychosocial  Psychosocial (WDL) WDL

## 2017-07-02 NOTE — NC FL2 (Signed)
[place] MEDICAID [id number] LEVEL OF CARE SCREENING TOOL     IDENTIFICATION  Patient Name: Kimberly Montgomery Birthdate: [birth year] Sex: female Admission Date (Current Location): 07/01/2017  [place] and [place] Number:  Chiropodist and Address:  [facility], [address], [place], [place] [address]      Provider Number: [id number]  Attending Physician Name and Address:  Enedina Finner, MD  Relative Name and Phone Number:       Current Level of Care: Hospital Recommended Level of Care: Skilled Nursing Facility Prior Approval Number:    Date Approved/Denied:   PASRR Number:    Discharge Plan: SNF    Current Diagnoses: Patient Active Problem List   Diagnosis Date Noted  . Hyperkalemia 07/01/2017  . Pneumonia 05/15/2017  . Benign essential tremor 01/30/2017  . Seizure (HCC)   . Acute encephalopathy   . Tremor   . Cerebrovascular disease   . Complication from renal dialysis device 04/08/2016  . Colitis 03/13/2016  . Chronic diastolic congestive heart failure (HCC) 01/22/2016  . Pressure injury of skin 01/20/2016  . Bacteremia, coagulase-negative staphylococcal 12/27/2015  . Essential hypertension 11/21/2015  . Anemia of chronic disease 11/21/2015  . Acute respiratory failure with hypoxia (HCC) 11/21/2015  . ESRD on dialysis (HCC) 11/21/2015  . 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 NSTEMI (non-ST elevated myocardial infarction) (HCC) 11/20/2015  . Chest pain, rule out acute myocardial infarction 11/18/2015  . Bipolar I disorder, most recent episode depressed (HCC)   . Altered mental status 08/24/2015  . Ileus (HCC)   . Bipolar I disorder (HCC) 07/25/2015  . Seizures (HCC) 07/25/2015  . Peritonitis (HCC) 07/16/2015  . Unstable angina (HCC) 07/09/2015  . Accelerated hypertension 07/09/2015  . Type 2 diabetes mellitus (HCC) 07/09/2015  . CAD (coronary artery disease) 07/09/2015  . HLD (hyperlipidemia) 07/09/2015  . GERD (gastroesophageal reflux  disease) 07/09/2015    Orientation RESPIRATION BLADDER Height & Weight     Self, Place, Situation  Normal Incontinent Weight: (stretcher) Height:  5\' 9"  (175.3 cm)  BEHAVIORAL SYMPTOMS/MOOD NEUROLOGICAL BOWEL NUTRITION STATUS  (none) (none) Incontinent Diet(renal diet)  AMBULATORY STATUS COMMUNICATION OF NEEDS Skin   Total Care Verbally Normal                       Personal Care Assistance Level of Assistance  Bathing, Dressing Bathing Assistance: Maximum assistance   Dressing Assistance: Maximum assistance     Functional Limitations Info  (none)          SPECIAL CARE FACTORS FREQUENCY                       Contractures Contractures Info: Not present    Additional Factors Info  Code Status Code Status Info: full             Current Medications (07/02/2017):  This is the current hospital active medication list Current Facility-Administered Medications  Medication Dose Route Frequency Provider Last Rate Last Dose  . acetaminophen (TYLENOL) tablet 650 mg  650 mg Oral Q6H PRN Enedina Finner, MD       Or  . acetaminophen (TYLENOL) suppository 650 mg  650 mg Rectal Q6H PRN Enedina Finner, MD      . amLODipine (NORVASC) tablet 10 mg  10 mg Oral Daily Enedina Finner, MD   10 mg at 07/01/17 1751  . aspirin chewable tablet 81 mg  81 mg Oral Daily Enedina Finner, MD   Stopped  at 07/02/17 1239  . atorvastatin (LIPITOR) tablet 40 mg  40 mg Oral q1800 Enedina Finner, MD   40 mg at 07/01/17 1750  . benztropine (COGENTIN) tablet 0.5 mg  0.5 mg Oral BID Enedina Finner, MD   0.5 mg at 07/01/17 2103  . clopidogrel (PLAVIX) tablet 75 mg  75 mg Oral Q1200 Enedina Finner, MD   Stopped at 07/02/17 1239  . escitalopram (LEXAPRO) tablet 2.5 mg  2.5 mg Oral Daily Enedina Finner, MD   Stopped at 07/02/17 1240  . fluticasone (FLONASE) 50 MCG/ACT nasal spray 1 spray  1 spray Each Nare Daily Enedina Finner, MD   1 spray at 07/02/17 1211  . gabapentin (NEURONTIN) capsule 300 mg  300 mg Oral Daily Enedina Finner,  MD   Stopped at 07/02/17 1240  . heparin injection 5,000 Units  5,000 Units Subcutaneous Q8H Enedina Finner, MD   5,000 Units at 07/02/17 1351  . hydrALAZINE (APRESOLINE) tablet 50 mg  50 mg Oral 3 times per day on Sun Tue Thu Sat Enedina Finner, MD   50 mg at 07/01/17 2101  . hydrALAZINE (APRESOLINE) tablet 50 mg  50 mg Oral 2 times per day on Mon Wed Fri Patel, Sona, MD      . HYDROcodone-acetaminophen (NORCO/VICODIN) 5-325 MG per tablet 1 tablet  1 tablet Oral Q6H PRN Enedina Finner, MD   1 tablet at 07/02/17 1001  . insulin aspart (novoLOG) injection 0-9 Units  0-9 Units Subcutaneous TID WC Enedina Finner, MD      . insulin aspart (novoLOG) injection 7 Units  7 Units Subcutaneous TID WC Enedina Finner, MD   7 Units at 07/01/17 1830  . insulin detemir (LEVEMIR) injection 4 Units  4 Units Subcutaneous Daily Enedina Finner, MD   4 Units at 07/02/17 1210  . isosorbide mononitrate (IMDUR) 24 hr tablet 90 mg  90 mg Oral Q1200 Enedina Finner, MD   90 mg at 07/01/17 1750  . lactulose (CHRONULAC) 10 GM/15ML solution 10 g  10 g Oral q1800 Enedina Finner, MD   10 g at 07/01/17 1750  . levETIRAcetam (KEPPRA) tablet 1,500 mg  1,500 mg Oral Q1200 Enedina Finner, MD   Stopped at 07/02/17 1240  . linagliptin (TRADJENTA) tablet 5 mg  5 mg Oral Daily Enedina Finner, MD   Stopped at 07/02/17 1241  . lisinopril (PRINIVIL,ZESTRIL) tablet 10 mg  10 mg Oral Q1200 Enedina Finner, MD   10 mg at 07/01/17 1751  . Melatonin TABS 5 mg  5 mg Oral QHS Enedina Finner, MD   5 mg at 07/01/17 2103  . metoprolol tartrate (LOPRESSOR) tablet 50 mg  50 mg Oral BID Enedina Finner, MD   50 mg at 07/02/17 0022  . multivitamin with minerals tablet 1 tablet  1 tablet Oral Daily Enedina Finner, MD   Stopped at 07/02/17 1241  . ondansetron (ZOFRAN) tablet 4 mg  4 mg Oral Q6H PRN Enedina Finner, MD       Or  . ondansetron [facility]) injection 4 mg  4 mg Intravenous Q6H PRN Enedina Finner, MD      . OXcarbazepine (TRILEPTAL) tablet 150 mg  150 mg Oral QHS Enedina Finner, MD   150 mg at  07/01/17 2103  . pantoprazole (PROTONIX) EC tablet 40 mg  40 mg Oral Daily Enedina Finner, MD   Stopped at 07/02/17 1241  . polyethylene glycol (MIRALAX / GLYCOLAX) packet 17 g  17 g Oral Q1200 Enedina Finner, MD   17 g at 07/02/17 1210  .  senna-docusate (Senokot-S) tablet 1 tablet  1 tablet Oral QHS PRN Enedina Finner, MD      . sevelamer carbonate (RENVELA) tablet 800 mg  800 mg Oral TID AC & HS Enedina Finner, MD   800 mg at 07/02/17 1210  . torsemide (DEMADEX) tablet 100 mg  100 mg Oral Daily Enedina Finner, MD   100 mg at 07/01/17 1826  . traZODone (DESYREL) tablet 25 mg  25 mg Oral QHS PRN Enedina Finner, MD         Discharge Medications: Please see discharge summary for a list of discharge medications.  Relevant Imaging Results:  Relevant Lab Results:   Additional Information    York Spaniel, LCSW

## 2017-07-03 LAB — GLUCOSE, CAPILLARY: Glucose-Capillary: 107 mg/dL — ABNORMAL HIGH (ref 65–99)

## 2017-07-31 ENCOUNTER — Encounter (INDEPENDENT_AMBULATORY_CARE_PROVIDER_SITE_OTHER): Payer: Medicare HMO

## 2017-07-31 ENCOUNTER — Ambulatory Visit (INDEPENDENT_AMBULATORY_CARE_PROVIDER_SITE_OTHER): Payer: Medicare HMO | Admitting: Vascular Surgery

## 2017-08-21 ENCOUNTER — Other Ambulatory Visit: Payer: Self-pay

## 2017-08-21 ENCOUNTER — Inpatient Hospital Stay
Admission: EM | Admit: 2017-08-21 | Discharge: 2017-08-30 | DRG: 981 | Disposition: A | Discharge status: Skilled Nursing Facility | Payer: Medicare HMO | Attending: Internal Medicine | Admitting: Internal Medicine

## 2017-08-21 ENCOUNTER — Encounter: Payer: Self-pay | Admitting: Emergency Medicine

## 2017-08-21 ENCOUNTER — Emergency Department: Payer: Medicare HMO

## 2017-08-21 ENCOUNTER — Other Ambulatory Visit
Admission: RE | Admit: 2017-08-21 | Discharge: 2017-08-21 | Disposition: A | Discharge status: Home / Self Care | Payer: Medicare HMO | Source: Ambulatory Visit | Attending: Family Medicine | Admitting: Family Medicine

## 2017-08-21 DIAGNOSIS — I5033 Acute on chronic diastolic (congestive) heart failure: Secondary | ICD-10-CM | POA: Diagnosis present

## 2017-08-21 DIAGNOSIS — I252 Old myocardial infarction: Secondary | ICD-10-CM

## 2017-08-21 DIAGNOSIS — F319 Bipolar disorder, unspecified: Secondary | ICD-10-CM | POA: Diagnosis present

## 2017-08-21 DIAGNOSIS — Z9842 Cataract extraction status, left eye: Secondary | ICD-10-CM

## 2017-08-21 DIAGNOSIS — E785 Hyperlipidemia, unspecified: Secondary | ICD-10-CM | POA: Diagnosis present

## 2017-08-21 DIAGNOSIS — I350 Nonrheumatic aortic (valve) stenosis: Secondary | ICD-10-CM | POA: Diagnosis present

## 2017-08-21 DIAGNOSIS — K3184 Gastroparesis: Secondary | ICD-10-CM | POA: Diagnosis present

## 2017-08-21 DIAGNOSIS — D631 Anemia in chronic kidney disease: Secondary | ICD-10-CM | POA: Diagnosis present

## 2017-08-21 DIAGNOSIS — R0602 Shortness of breath: Secondary | ICD-10-CM

## 2017-08-21 DIAGNOSIS — E669 Obesity, unspecified: Secondary | ICD-10-CM | POA: Diagnosis present

## 2017-08-21 DIAGNOSIS — Z955 Presence of coronary angioplasty implant and graft: Secondary | ICD-10-CM

## 2017-08-21 DIAGNOSIS — Z7902 Long term (current) use of antithrombotics/antiplatelets: Secondary | ICD-10-CM

## 2017-08-21 DIAGNOSIS — Z7982 Long term (current) use of aspirin: Secondary | ICD-10-CM

## 2017-08-21 DIAGNOSIS — N186 End stage renal disease: Secondary | ICD-10-CM | POA: Diagnosis present

## 2017-08-21 DIAGNOSIS — Z9841 Cataract extraction status, right eye: Secondary | ICD-10-CM

## 2017-08-21 DIAGNOSIS — E875 Hyperkalemia: Secondary | ICD-10-CM

## 2017-08-21 DIAGNOSIS — J9602 Acute respiratory failure with hypercapnia: Secondary | ICD-10-CM | POA: Diagnosis present

## 2017-08-21 DIAGNOSIS — Z6839 Body mass index (BMI) 39.0-39.9, adult: Secondary | ICD-10-CM

## 2017-08-21 DIAGNOSIS — Z8673 Personal history of transient ischemic attack (TIA), and cerebral infarction without residual deficits: Secondary | ICD-10-CM

## 2017-08-21 DIAGNOSIS — Z8701 Personal history of pneumonia (recurrent): Secondary | ICD-10-CM

## 2017-08-21 DIAGNOSIS — N390 Urinary tract infection, site not specified: Secondary | ICD-10-CM | POA: Diagnosis not present

## 2017-08-21 DIAGNOSIS — K219 Gastro-esophageal reflux disease without esophagitis: Secondary | ICD-10-CM | POA: Diagnosis present

## 2017-08-21 DIAGNOSIS — R4182 Altered mental status, unspecified: Secondary | ICD-10-CM | POA: Diagnosis not present

## 2017-08-21 DIAGNOSIS — Z87891 Personal history of nicotine dependence: Secondary | ICD-10-CM

## 2017-08-21 DIAGNOSIS — F411 Generalized anxiety disorder: Secondary | ICD-10-CM | POA: Diagnosis present

## 2017-08-21 DIAGNOSIS — Z888 Allergy status to other drugs, medicaments and biological substances status: Secondary | ICD-10-CM

## 2017-08-21 DIAGNOSIS — Z992 Dependence on renal dialysis: Secondary | ICD-10-CM

## 2017-08-21 DIAGNOSIS — Z79899 Other long term (current) drug therapy: Secondary | ICD-10-CM

## 2017-08-21 DIAGNOSIS — Z794 Long term (current) use of insulin: Secondary | ICD-10-CM

## 2017-08-21 DIAGNOSIS — Z881 Allergy status to other antibiotic agents status: Secondary | ICD-10-CM

## 2017-08-21 DIAGNOSIS — E1151 Type 2 diabetes mellitus with diabetic peripheral angiopathy without gangrene: Secondary | ICD-10-CM | POA: Diagnosis present

## 2017-08-21 DIAGNOSIS — E1122 Type 2 diabetes mellitus with diabetic chronic kidney disease: Secondary | ICD-10-CM | POA: Diagnosis present

## 2017-08-21 DIAGNOSIS — I2510 Atherosclerotic heart disease of native coronary artery without angina pectoris: Secondary | ICD-10-CM

## 2017-08-21 DIAGNOSIS — R590 Localized enlarged lymph nodes: Secondary | ICD-10-CM | POA: Diagnosis present

## 2017-08-21 DIAGNOSIS — I132 Hypertensive heart and chronic kidney disease with heart failure and with stage 5 chronic kidney disease, or end stage renal disease: Secondary | ICD-10-CM | POA: Diagnosis present

## 2017-08-21 DIAGNOSIS — B952 Enterococcus as the cause of diseases classified elsewhere: Secondary | ICD-10-CM | POA: Diagnosis present

## 2017-08-21 DIAGNOSIS — G40909 Epilepsy, unspecified, not intractable, without status epilepticus: Secondary | ICD-10-CM | POA: Diagnosis present

## 2017-08-21 DIAGNOSIS — G9340 Encephalopathy, unspecified: Secondary | ICD-10-CM | POA: Diagnosis present

## 2017-08-21 DIAGNOSIS — G43909 Migraine, unspecified, not intractable, without status migrainosus: Secondary | ICD-10-CM | POA: Diagnosis present

## 2017-08-21 DIAGNOSIS — F419 Anxiety disorder, unspecified: Secondary | ICD-10-CM | POA: Diagnosis present

## 2017-08-21 DIAGNOSIS — J189 Pneumonia, unspecified organism: Secondary | ICD-10-CM

## 2017-08-21 DIAGNOSIS — G9341 Metabolic encephalopathy: Secondary | ICD-10-CM | POA: Diagnosis present

## 2017-08-21 DIAGNOSIS — J449 Chronic obstructive pulmonary disease, unspecified: Secondary | ICD-10-CM | POA: Diagnosis present

## 2017-08-21 DIAGNOSIS — Z89511 Acquired absence of right leg below knee: Secondary | ICD-10-CM

## 2017-08-21 DIAGNOSIS — E1143 Type 2 diabetes mellitus with diabetic autonomic (poly)neuropathy: Secondary | ICD-10-CM | POA: Diagnosis present

## 2017-08-21 DIAGNOSIS — J9601 Acute respiratory failure with hypoxia: Secondary | ICD-10-CM

## 2017-08-21 DIAGNOSIS — E876 Hypokalemia: Secondary | ICD-10-CM | POA: Diagnosis not present

## 2017-08-21 DIAGNOSIS — G8929 Other chronic pain: Secondary | ICD-10-CM | POA: Diagnosis present

## 2017-08-21 DIAGNOSIS — Z882 Allergy status to sulfonamides status: Secondary | ICD-10-CM

## 2017-08-21 DIAGNOSIS — Z7951 Long term (current) use of inhaled steroids: Secondary | ICD-10-CM

## 2017-08-21 DIAGNOSIS — Z880 Allergy status to penicillin: Secondary | ICD-10-CM

## 2017-08-21 DIAGNOSIS — I161 Hypertensive emergency: Secondary | ICD-10-CM | POA: Diagnosis not present

## 2017-08-21 DIAGNOSIS — Z90710 Acquired absence of both cervix and uterus: Secondary | ICD-10-CM

## 2017-08-21 DIAGNOSIS — I214 Non-ST elevation (NSTEMI) myocardial infarction: Secondary | ICD-10-CM

## 2017-08-21 LAB — COMPREHENSIVE METABOLIC PANEL
ALBUMIN: 3.7 g/dL (ref 3.5–5.0)
ALK PHOS: 151 U/L — AB (ref 38–126)
ALT: 9 U/L — AB (ref 14–54)
AST: 15 U/L (ref 15–41)
Anion gap: 12 (ref 5–15)
BUN: 37 mg/dL — ABNORMAL HIGH (ref 6–20)
CALCIUM: 9.1 mg/dL (ref 8.9–10.3)
CO2: 26 mmol/L (ref 22–32)
CREATININE: 6.39 mg/dL — AB (ref 0.44–1.00)
Chloride: 93 mmol/L — ABNORMAL LOW (ref 101–111)
GFR calc non Af Amer: 7 mL/min — ABNORMAL LOW (ref >60)
GFR, EST AFRICAN AMERICAN: 8 mL/min — AB (ref >60)
GLUCOSE: 90 mg/dL (ref 65–99)
Potassium: 6.2 mmol/L — ABNORMAL HIGH (ref 3.5–5.1)
SODIUM: 131 mmol/L — AB (ref 135–145)
Total Bilirubin: 0.5 mg/dL (ref 0.3–1.2)
Total Protein: 7.5 g/dL (ref 6.5–8.1)

## 2017-08-21 LAB — BLOOD GAS, VENOUS
ACID-BASE EXCESS: 2 mmol/L (ref 0.0–2.0)
Bicarbonate: 29.4 mmol/L — ABNORMAL HIGH (ref 20.0–28.0)
O2 Saturation: 82.4 %
PH VEN: 7.32 (ref 7.250–7.430)
Patient temperature: 37
pCO2, Ven: 57 mmHg (ref 44.0–60.0)
pO2, Ven: 51 mmHg — ABNORMAL HIGH (ref 32.0–45.0)

## 2017-08-21 LAB — URINALYSIS, COMPLETE (UACMP) WITH MICROSCOPIC
BILIRUBIN URINE: NEGATIVE
Glucose, UA: 50 mg/dL — AB
HGB URINE DIPSTICK: NEGATIVE
Ketones, ur: NEGATIVE mg/dL
LEUKOCYTES UA: NEGATIVE
NITRITE: NEGATIVE
SPECIFIC GRAVITY, URINE: 1.015 (ref 1.005–1.030)
pH: 6 (ref 5.0–8.0)

## 2017-08-21 LAB — TROPONIN I: Troponin I: <0.03 ng/mL (ref <0.03)

## 2017-08-21 LAB — CBC
HCT: 38.4 % (ref 35.0–47.0)
HEMOGLOBIN: 12.4 g/dL (ref 12.0–16.0)
MCH: 28.6 pg (ref 26.0–34.0)
MCHC: 32.3 g/dL (ref 32.0–36.0)
MCV: 88.5 fL (ref 80.0–100.0)
PLATELETS: 260 10*3/uL (ref 150–440)
RBC: 4.34 MIL/uL (ref 3.80–5.20)
RDW: 16.8 % — ABNORMAL HIGH (ref 11.5–14.5)
WBC: 6 10*3/uL (ref 3.6–11.0)

## 2017-08-21 LAB — AMMONIA: AMMONIA: 35 umol/L (ref 9–35)

## 2017-08-21 MED ORDER — DEXTROSE 50 % IV SOLN
1.0000 | Freq: Once | INTRAVENOUS | Status: AC
  Administered 2017-08-22: 50 mL via INTRAVENOUS
  Filled 2017-08-21: qty 50

## 2017-08-21 MED ORDER — LORAZEPAM 2 MG/ML IJ SOLN
0.5000 mg | Freq: Once | INTRAMUSCULAR | Status: AC
  Administered 2017-08-22: 0.5 mg via INTRAVENOUS
  Filled 2017-08-21: qty 1

## 2017-08-21 MED ORDER — INSULIN ASPART 100 UNIT/ML ~~LOC~~ SOLN
5.0000 [IU] | Freq: Once | SUBCUTANEOUS | Status: AC
  Administered 2017-08-22: 5 [IU] via INTRAVENOUS
  Filled 2017-08-21: qty 1

## 2017-08-21 MED ORDER — BENZTROPINE MESYLATE 1 MG/ML IJ SOLN
0.5000 mg | Freq: Once | INTRAMUSCULAR | Status: AC
  Administered 2017-08-21: 0.5 mg via INTRAMUSCULAR
  Filled 2017-08-21: qty 0.5

## 2017-08-21 MED ORDER — SODIUM CHLORIDE 0.9 % IV SOLN
1.0000 g | Freq: Once | INTRAVENOUS | Status: AC
  Administered 2017-08-22: 1 g via INTRAVENOUS
  Filled 2017-08-21: qty 10

## 2017-08-21 MED ORDER — ALBUTEROL SULFATE (2.5 MG/3ML) 0.083% IN NEBU
2.5000 mg | INHALATION_SOLUTION | Freq: Once | RESPIRATORY_TRACT | Status: AC
  Administered 2017-08-22: 2.5 mg via RESPIRATORY_TRACT
  Filled 2017-08-21: qty 3

## 2017-08-21 MED ORDER — LORAZEPAM 2 MG/ML IJ SOLN: 1.0000 mg | Freq: Once | INTRAMUSCULAR | Status: DC

## 2017-08-21 NOTE — ED Notes (Signed)
ED Provider at bedside. 

## 2017-08-21 NOTE — ED Triage Notes (Signed)
Pt to ED via EMS from [facility] c/o AMS, per EMS AMS approx. 2045 tonight, but has been drowsy all day which is not normal to patient.  Nurse at facility found patient with eyes open but not responding.  Blood work done at facility shown hypokalemia.  Normally A&Ox4, speaking at baseline.  EMS vitals 160/80 BP, 60 HR, 88 CBG, 88% RA and placed on 2L [place].  Pt will look when called by name but does not respond verbally, poor attention.

## 2017-08-21 NOTE — ED Provider Notes (Addendum)
[facility] Emergency Department Provider Note    First MD Initiated Contact with Patient 08/21/17 2145     (approximate)  I have reviewed the triage vital signs and the nursing notes.   HISTORY  Chief Complaint Altered Mental Status  Level V Caveat:  Acute encephalopathy  HPI Kimberly Montgomery is a 52 y.o. female extensive past medical history presents from [facility] with report of altered mental status.  Patient reportedly has been's "sleepy "since early this morning.  Facility nurse reportedly noted around 29 tonight that she was less responsive but had her eyes open but would not talk which is not her baseline.  She was sent to the ER for further evaluation.  She does not wear home oxygen.  Patient unable to provide any additional history.  Past Medical History:  Diagnosis Date  . Anginal pain (HCC)   . Anxiety   . Bipolar disorder (HCC)   . CAD (coronary artery disease)   . CHF (congestive heart failure) (HCC)   . Chronic lower back pain   . Depression   . Endometriosis   . ESRD (end stage renal disease) on dialysis (HCC)    "[facility]; [address]; [place]; TTS" (01/19/2016)  . Gastroparesis   . GERD (gastroesophageal reflux disease)   . History of blood transfusion "several"   "my blood would get low; low RBC"  . History of hiatal hernia   . HLD (hyperlipidemia)   . Hypertension   . Migraine    "monthly" (01/19/2016)  . Myocardial infarction (HCC) 2017   "~ 3 wks ago" (01/19/2016)  . Renal disorder   . Renal insufficiency   . Seizures (HCC) 07/2015   "I've only had the 1; don't know what from" (01/19/2016)  . Stroke (HCC)   . Type II diabetes mellitus (HCC)    Family History  Problem Relation Age of Onset  . CAD Unknown   . Diabetes Unknown   . Bipolar disorder Unknown   . Cervical cancer Mother   . Other Father        heart bypass  . Breast cancer Neg Hx    Past Surgical History:  Procedure Laterality Date  . ABDOMINAL  HYSTERECTOMY     "partial"  . AV FISTULA PLACEMENT Right 08/23/2016   Procedure: ARTERIOVENOUS (AV) FISTULA CREATION  ( BRACHIAL CEPHALIC );  Surgeon: Renford Dills, MD;  Location: [facility] ORS;  Service: Vascular;  Laterality: Right;  . BELOW KNEE LEG AMPUTATION Right 2010?  [name] CARDIAC CATHETERIZATION Right 07/10/2015   Procedure: Left Heart Cath and Coronary Angiography;  Surgeon: Laurier Nancy, MD;  Location: [facility] INVASIVE CV LAB;  Service: Cardiovascular;  Laterality: Right;  . CARDIAC CATHETERIZATION Right 09/14/2015   Procedure: Left Heart Cath and Coronary Angiography;  Surgeon: Laurier Nancy, MD;  Location: [facility] INVASIVE CV LAB;  Service: Cardiovascular;  Laterality: Right;  . CARDIAC CATHETERIZATION N/A 09/14/2015   Procedure: Coronary Stent Intervention;  Surgeon: Alwyn Pea, MD;  Location: [facility] INVASIVE CV LAB;  Service: Cardiovascular;  Laterality: N/A;  . CARDIAC CATHETERIZATION Right 11/20/2015   Procedure: Left Heart Cath and Coronary Angiography;  Surgeon: Laurier Nancy, MD;  Location: [facility] INVASIVE CV LAB;  Service: Cardiovascular;  Laterality: Right;  . CATARACT EXTRACTION, BILATERAL    . CORONARY ANGIOPLASTY WITH STENT PLACEMENT  <2017   @ [facility]/notes 07/03/2013  . CORONARY ANGIOPLASTY WITH STENT PLACEMENT    . DIALYSIS/PERMA CATHETER REMOVAL N/A 11/12/2016   Procedure: DIALYSIS/PERMA CATHETER  REMOVAL;  Surgeon: Renford Dills, MD;  Location: [facility] INVASIVE CV LAB;  Service: Cardiovascular;  Laterality: N/A;  . HYSTEROTOMY    . PERIPHERAL VASCULAR CATHETERIZATION N/A 08/30/2015   Procedure: Dialysis/Perma Catheter Insertion;  Surgeon: Annice Needy, MD;  Location: [facility] INVASIVE CV LAB;  Service: Cardiovascular;  Laterality: N/A;  . PERIPHERAL VASCULAR CATHETERIZATION N/A 01/01/2016   Procedure: Dialysis/Perma Catheter Insertion;  Surgeon: Annice Needy, MD;  Location: [facility] INVASIVE CV LAB;  Service: Cardiovascular;  Laterality: N/A;  . PERITONEAL CATHETER INSERTION  11/03/2013    [name] 11/03/2013  . PERITONEAL CATHETER REMOVAL  01/01/2016   "took the one from May out; put new PD cath in" (01/19/2016)  . PERITONEAL CATHETER REMOVAL  11/03/2013; 02/11/2014   [name] 11/03/2013; Removal of tunneled catheter/notes 02/11/2014  . SALPINGOOPHORECTOMY Right    [name] 07/03/2013  . TEE WITHOUT CARDIOVERSION N/A 06/07/2016   Procedure: TRANSESOPHAGEAL ECHOCARDIOGRAM (TEE);  Surgeon: Chrystie Nose, MD;  Location[facility] ENDOSCOPY;  Service: Cardiovascular;  Laterality: N/A;  . TUBAL LIGATION     Patient Active Problem List   Diagnosis Date Noted  . Hyperkalemia 07/01/2017  . Pneumonia 05/15/2017  . Benign essential tremor 01/30/2017  . Seizure (HCC)   . Acute encephalopathy   . Tremor   . Cerebrovascular disease   . Complication from renal dialysis device 04/08/2016  . Colitis 03/13/2016  . Chronic diastolic congestive heart failure (HCC) 01/22/2016  . Pressure injury of skin 01/20/2016  . Bacteremia, coagulase-negative staphylococcal 12/27/2015  . Essential hypertension 11/21/2015  . Anemia of chronic disease 11/21/2015  . Acute respiratory failure with hypoxia (HCC) 11/21/2015  . ESRD on dialysis (HCC) 11/21/2015  . 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 NSTEMI (non-ST elevated myocardial infarction) (HCC) 11/20/2015  . Chest pain, rule out acute myocardial infarction 11/18/2015  . Bipolar I disorder, most recent episode depressed (HCC)   . Altered mental status 08/24/2015  . Ileus (HCC)   . Bipolar I disorder (HCC) 07/25/2015  . Seizures (HCC) 07/25/2015  . Peritonitis (HCC) 07/16/2015  . Unstable angina (HCC) 07/09/2015  . Accelerated hypertension 07/09/2015  . Type 2 diabetes mellitus (HCC) 07/09/2015  . CAD (coronary artery disease) 07/09/2015  . HLD (hyperlipidemia) 07/09/2015  . GERD (gastroesophageal reflux disease) 07/09/2015      Prior to Admission medications   Medication Sig Start Date End Date Taking? Authorizing Provider  acetaminophen (TYLENOL) 500 MG  tablet Take 500 mg by mouth every 6 (six) hours as needed for moderate pain or fever.    [provider]  amLODipine (NORVASC) 10 MG tablet Take 10 mg by mouth daily.     [provider]  aspirin 81 MG chewable tablet Chew 81 mg by mouth daily.    [provider]  atorvastatin (LIPITOR) 40 MG tablet Take 40 mg by mouth daily at 6 PM.    [provider]  benztropine (COGENTIN) 0.5 MG tablet Take 0.5 mg by mouth 2 (two) times daily.    [provider]  bisacodyl (DULCOLAX) 10 MG suppository Place 10 mg rectally as needed for moderate constipation.    [provider]  carboxymethylcellulose (REFRESH) 1 % ophthalmic solution Place 1 drop into both eyes 3 (three) times daily.    [provider]  clonazePAM (KLONOPIN) 0.5 MG tablet Take  tablet (0.25MG ) by mouth every morning and 1 tablet (0.5MG ) by mouth every evening    [provider]  clopidogrel (PLAVIX) 75 MG tablet Take 75 mg by mouth daily at  12 noon.     [provider]  escitalopram (LEXAPRO) 5 MG tablet Take 2.5 mg by mouth daily.    [provider]  fluticasone (FLONASE) 50 MCG/ACT nasal spray Place 1 spray into both nostrils daily.     [provider]  gabapentin (NEURONTIN) 300 MG capsule Take 300 mg by mouth daily.     [provider]  hydrALAZINE (APRESOLINE) 50 MG tablet Take 1 tablet (50MG ) by mouth three times daily (Sunday, Tuesday, Thursday & Saturday) and take 1 tablet (50MG ) by mouth twice daily (Monday, Wednesday & Friday).    [provider]  HYDROcodone-acetaminophen (NORCO/VICODIN) 5-325 MG tablet Take 1 tablet by mouth every 6 (six) hours as needed for moderate pain or severe pain. 05/20/17   Delfino Lovett, MD  insulin aspart (NOVOLOG) 100 UNIT/ML injection Inject 7 Units into the skin 3 (three) times daily with meals.     [provider]  insulin detemir (LEVEMIR) 100 UNIT/ML injection Inject 4 Units into  the skin daily.     [provider]  isosorbide mononitrate (IMDUR) 30 MG 24 hr tablet Take 90 mg by mouth daily at 12 noon.     [provider]  lactulose (CHRONULAC) 10 GM/15ML solution Take 10 g by mouth daily at 6 PM.     [provider]  levETIRAcetam (KEPPRA) 750 MG tablet Take 2 tablets (1,500 mg total) by mouth daily at 12 noon. 05/20/17   Delfino Lovett, MD  lidocaine (LIDODERM) 5 % Place 1 patch onto the skin at bedtime.     [provider]  lidocaine (LMX) 4 % cream Apply 1 application topically daily as needed (pain). To right arm fistula    [provider]  linagliptin (TRADJENTA) 5 MG TABS tablet Take 5 mg by mouth daily.    [provider]  lisinopril (PRINIVIL,ZESTRIL) 10 MG tablet Take 10 mg by mouth daily at 12 noon.    [provider]  Melatonin 3 MG TABS Take 3 mg by mouth at bedtime.    [provider]  metoprolol (LOPRESSOR) 50 MG tablet Take 1 tablet (50 mg total) by mouth 2 (two) times daily. 09/15/15   Milagros Loll, MD  multivitamin (ONE-A-DAY MEN'S) TABS tablet Take 1 tablet by mouth daily.    [provider]  nitroGLYCERIN (NITROSTAT) 0.4 MG SL tablet Place 0.4 mg under the tongue every 5 (five) minutes as needed for chest pain. Reported on 07/10/2015    [provider]  omeprazole (PRILOSEC) 20 MG capsule Take 20 mg by mouth 2 (two) times daily.     [provider]  ondansetron (ZOFRAN ODT) 4 MG disintegrating tablet Take 1 tablet (4 mg total) by mouth every 6 (six) hours as needed for nausea or vomiting. 07/16/15   Sharyn Creamer, MD  OXcarbazepine (TRILEPTAL) 150 MG tablet Take 150 mg by mouth at bedtime.    [provider]  polyethylene glycol (MIRALAX / GLYCOLAX) packet Take 17 g by mouth daily. Patient taking differently: Take 17 g by mouth daily at 12 noon.  08/02/15   Adrian Saran, MD  senna (SENOKOT) 8.6 MG TABS tablet Take 2 tablets by mouth 2 (two) times daily.      [provider]  sevelamer carbonate (RENVELA) 800 MG tablet Take 800 mg by mouth 4 (four) times daily -  before meals and at bedtime.     [provider]  torsemide (DEMADEX) 100 MG tablet Take 100 mg by mouth daily.  [provider]    Allergies Cephalosporins; Penicillins; Reglan [metoclopramide]; Risperdal [risperidone]; Lamictal [lamotrigine]; Phenergan [promethazine hcl]; Pravastatin; and Sulfa antibiotics    Social History Social History   Tobacco Use  . Smoking status: Former Smoker    Packs/day: 0.75    Types: Cigarettes    Last attempt to quit: 07/01/2015    Years since quitting: 2.1  . Smokeless tobacco: Never Used  Substance Use Topics  . Alcohol use: No    Alcohol/week: 0.0 oz  . Drug use: No    Review of Systems Patient denies headaches, rhinorrhea, blurry vision, numbness, shortness of breath, chest pain, edema, cough, abdominal pain, nausea, vomiting, diarrhea, dysuria, fevers, rashes or hallucinations unless otherwise stated above in HPI. ____________________________________________   PHYSICAL EXAM:  VITAL SIGNS: Vitals:   08/21/17 2240 08/21/17 2353  BP: (!) 149/89 (!) 162/72  Pulse: (!) 58 (!) 56  Resp: 15 17  Temp:    SpO2: 99% 96%    Constitutional: Tonically ill-appearing in moderate distress.. Patient will track and follow you about the room but will not provide any history and is not talking.  Appears clearly encephalopathic.   Eyes: Conjunctivae are normal.  Head: Atraumatic. Nose: No congestion/rhinnorhea. Mouth/Throat: Mucous membranes are moist.    Neck: No stridor. Painless ROM.  Cardiovascular: Normal rate, regular rhythm. Grossly normal heart sounds.  Good peripheral circulation. Respiratory: shallow breaths,.  No retractions. Lungs diminished throughout Gastrointestinal: Soft and nontender. No distention. No abdominal bruits. No CVA tenderness. Genitourinary:  Musculoskeletal: No lower extremity  tenderness nor edema. S/p right BKA.  No joint effusions. Neurologic:  Eyes open spontaneously, tracking as I walk in room, will squeeze and open right hand.  Localizes to pain,   Skin:  Skin is warm, dry and intact. No rash noted. Psychiatric: unable to assess _______________________   LABS (all labs ordered are listed, but only abnormal results are displayed)  Results for orders placed or performed during the hospital encounter of 08/21/17 (from the past 24 hour(s))  Ammonia     Status: None   Collection Time: 08/21/17 10:01 PM  Result Value Ref Range   Ammonia 35 9 - 35 umol/L  Blood gas, venous     Status: Abnormal   Collection Time: 08/21/17 10:36 PM  Result Value Ref Range   pH, Ven 7.32 7.250 - 7.430   pCO2, Ven 57 44.0 - 60.0 mmHg   pO2, Ven 51.0 (H) 32.0 - 45.0 mmHg   Bicarbonate 29.4 (H) 20.0 - 28.0 mmol/L   Acid-Base Excess 2.0 0.0 - 2.0 mmol/L   O2 Saturation 82.4 %   Patient temperature 37.0    Collection site VENOUS    Sample type VENOUS   CBC     Status: Abnormal   Collection Time: 08/21/17 10:36 PM  Result Value Ref Range   WBC 6.0 3.6 - 11.0 K/uL   RBC 4.34 3.80 - 5.20 MIL/uL   Hemoglobin 12.4 12.0 - 16.0 g/dL   HCT 16.1 09.6 - 04.5 %   MCV 88.5 80.0 - 100.0 fL   MCH 28.6 26.0 - 34.0 pg   MCHC 32.3 32.0 - 36.0 g/dL   RDW 40.9 (H) 81.1 - 91.4 %   Platelets 260 150 - 440 K/uL  Comprehensive metabolic panel     Status: Abnormal   Collection Time: 08/21/17 10:36 PM  Result Value Ref Range   Sodium 131 (L) 135 - 145 mmol/L   Potassium 6.2 (H) 3.5 - 5.1 mmol/L   Chloride  93 (L) 101 - 111 mmol/L   CO2 26 22 - 32 mmol/L   Glucose, Bld 90 65 - 99 mg/dL   BUN 37 (H) 6 - 20 mg/dL   Creatinine, Ser 6.19 (H) 0.44 - 1.00 mg/dL   Calcium 9.1 8.9 - 50.9 mg/dL   Total Protein 7.5 6.5 - 8.1 g/dL   Albumin 3.7 3.5 - 5.0 g/dL   AST 15 15 - 41 U/L   ALT 9 (L) 14 - 54 U/L   Alkaline Phosphatase 151 (H) 38 - 126 U/L   Total Bilirubin 0.5 0.3 - 1.2 mg/dL   GFR calc non  Af Amer 7 (L) >60 mL/min   GFR calc Af Amer 8 (L) >60 mL/min   Anion gap 12 5 - 15  Troponin I     Status: None   Collection Time: 08/21/17 10:36 PM  Result Value Ref Range   Troponin I <0.03 <0.03 ng/mL   ____________________________________________  EKG My review and personal interpretation at Time:  21:48  Indication: hyperkalemia  Rate: 60  Rhythm: sinus Axis: normal Other: poor r wave progression, normal intervals, no stemi ____________________________________________  RADIOLOGY  I personally reviewed all radiographic images ordered to evaluate for the above acute complaints and reviewed radiology reports and findings.  These findings were personally discussed with the patient.  Please see medical record for radiology report.  ____________________________________________   PROCEDURES  Procedure(s) performed:  .Critical Care Performed by: Willy Eddy, MD Authorized by: Willy Eddy, MD   Critical care provider statement:    Critical care time (minutes):  30   Critical care time was exclusive of:  Separately billable procedures and treating other patients   Critical care was necessary to treat or prevent imminent or life-threatening deterioration of the following conditions:  CNS failure or compromise and renal failure   Critical care was time spent personally by me on the following activities:  Development of treatment plan with patient or surrogate, discussions with consultants, evaluation of patient's response to treatment, examination of patient, obtaining history from patient or surrogate, ordering and performing treatments and interventions, ordering and review of laboratory studies, ordering and review of radiographic studies, pulse oximetry, re-evaluation of patient's condition and review of old charts      Critical Care performed: yes ____________________________________________   INITIAL IMPRESSION / ASSESSMENT AND PLAN / ED COURSE  Pertinent labs  & imaging results that were available during my care of the patient were reviewed by me and considered in my medical decision making (see chart for details).  DDX: Dehydration, sepsis, pna, uti, hypoglycemia, cva, drug effect, withdrawal, encephalitis   ZARINAH HURRY is a 52 y.o. who presents to the ED with very bizarre presentation as described above.  Possible CVA she does have significant risk factors but no lateralizing features and last seen normal would have been yesterday.  Based on her presentation and I have a higher suspicion for metabolic process possibly hepatic encephalopathy, hyperkalemia, UTI or even underlying psychiatric illness.  Patient also on multiple medications possibly having dystonic reaction.  Nevertheless the differential remains exceedingly broad.  Blood work will be sent for the above differential.  Will order CT imaging.  Does have shallow breath sounds concerning for edema and is requiring supplemental oxygen at this time.  Clinical Course as of Aug 23 7  Thu Aug 21, 2017  2315 Patient reassessed.  Still awaiting laboratory evaluation.  Ammonia is normal.  Patient now responding and moaning.  Seems very anxious may  be having some component of dystonic reaction.  CT head fortunately shows no abnormality.  Does have cardiomegaly that is new.   [PR]  2354 Patient with no significant change after Cogentin.  Most likely multifactorial secondary to multiple medications.  Possible underlying psychiatric encephalopathy.  Possible overdose.  Have high clinical suspicion for component of underlying renal dysfunction with metabolite building up.  She does have worsening cardiomegaly with new hypoxia and with mild hyperkalemia I do feel patient will require medical admission for dialysis in the morning after temporizing medications and supplemental oxygen.  Have discussed with the patient and available family all diagnostics and treatments performed thus far and all questions were  answered to the best of my ability. The patient demonstrates understanding and agreement with plan.    [PR]    Clinical Course User Index [PR] Willy Eddy, MD     As part of my medical decision making, I reviewed the following data within the electronic MEDICAL RECORD NUMBER Nursing notes reviewed and incorporated, Labs reviewed, notes from prior ED visits and [place] Controlled Substance Database   ____________________________________________   FINAL CLINICAL IMPRESSION(S) / ED DIAGNOSES  Final diagnoses:  Altered mental status, unspecified altered mental status type  Hyperkalemia  Acute respiratory failure with hypoxia (HCC)      NEW MEDICATIONS STARTED DURING THIS VISIT:  New Prescriptions   No medications on file     Note:  This document was prepared using Dragon voice recognition software and may include unintentional dictation errors.    Willy Eddy, MD 08/22/17 0000    Willy Eddy, MD 08/22/17 [phone]

## 2017-08-22 ENCOUNTER — Inpatient Hospital Stay: Payer: Medicare HMO

## 2017-08-22 DIAGNOSIS — E1151 Type 2 diabetes mellitus with diabetic peripheral angiopathy without gangrene: Secondary | ICD-10-CM | POA: Diagnosis present

## 2017-08-22 DIAGNOSIS — G40909 Epilepsy, unspecified, not intractable, without status epilepticus: Secondary | ICD-10-CM | POA: Diagnosis present

## 2017-08-22 DIAGNOSIS — I132 Hypertensive heart and chronic kidney disease with heart failure and with stage 5 chronic kidney disease, or end stage renal disease: Secondary | ICD-10-CM | POA: Diagnosis present

## 2017-08-22 DIAGNOSIS — I2510 Atherosclerotic heart disease of native coronary artery without angina pectoris: Secondary | ICD-10-CM | POA: Diagnosis present

## 2017-08-22 DIAGNOSIS — E1122 Type 2 diabetes mellitus with diabetic chronic kidney disease: Secondary | ICD-10-CM | POA: Diagnosis present

## 2017-08-22 DIAGNOSIS — N186 End stage renal disease: Secondary | ICD-10-CM

## 2017-08-22 DIAGNOSIS — Z992 Dependence on renal dialysis: Secondary | ICD-10-CM | POA: Diagnosis not present

## 2017-08-22 DIAGNOSIS — I5033 Acute on chronic diastolic (congestive) heart failure: Secondary | ICD-10-CM | POA: Diagnosis present

## 2017-08-22 DIAGNOSIS — R079 Chest pain, unspecified: Secondary | ICD-10-CM | POA: Diagnosis not present

## 2017-08-22 DIAGNOSIS — E875 Hyperkalemia: Secondary | ICD-10-CM | POA: Diagnosis present

## 2017-08-22 DIAGNOSIS — Z87898 Personal history of other specified conditions: Secondary | ICD-10-CM | POA: Diagnosis not present

## 2017-08-22 DIAGNOSIS — Z6839 Body mass index (BMI) 39.0-39.9, adult: Secondary | ICD-10-CM | POA: Diagnosis not present

## 2017-08-22 DIAGNOSIS — E876 Hypokalemia: Secondary | ICD-10-CM | POA: Diagnosis not present

## 2017-08-22 DIAGNOSIS — I161 Hypertensive emergency: Secondary | ICD-10-CM | POA: Diagnosis not present

## 2017-08-22 DIAGNOSIS — J449 Chronic obstructive pulmonary disease, unspecified: Secondary | ICD-10-CM | POA: Diagnosis present

## 2017-08-22 DIAGNOSIS — F411 Generalized anxiety disorder: Secondary | ICD-10-CM | POA: Diagnosis present

## 2017-08-22 DIAGNOSIS — R4182 Altered mental status, unspecified: Secondary | ICD-10-CM

## 2017-08-22 DIAGNOSIS — G9341 Metabolic encephalopathy: Secondary | ICD-10-CM | POA: Diagnosis present

## 2017-08-22 DIAGNOSIS — E1143 Type 2 diabetes mellitus with diabetic autonomic (poly)neuropathy: Secondary | ICD-10-CM | POA: Diagnosis present

## 2017-08-22 DIAGNOSIS — N390 Urinary tract infection, site not specified: Secondary | ICD-10-CM | POA: Diagnosis present

## 2017-08-22 DIAGNOSIS — B952 Enterococcus as the cause of diseases classified elsewhere: Secondary | ICD-10-CM | POA: Diagnosis present

## 2017-08-22 DIAGNOSIS — J9602 Acute respiratory failure with hypercapnia: Secondary | ICD-10-CM | POA: Diagnosis present

## 2017-08-22 DIAGNOSIS — D631 Anemia in chronic kidney disease: Secondary | ICD-10-CM | POA: Diagnosis present

## 2017-08-22 DIAGNOSIS — I214 Non-ST elevation (NSTEMI) myocardial infarction: Secondary | ICD-10-CM | POA: Diagnosis not present

## 2017-08-22 DIAGNOSIS — J9601 Acute respiratory failure with hypoxia: Secondary | ICD-10-CM | POA: Diagnosis not present

## 2017-08-22 DIAGNOSIS — K3184 Gastroparesis: Secondary | ICD-10-CM | POA: Diagnosis present

## 2017-08-22 DIAGNOSIS — F319 Bipolar disorder, unspecified: Secondary | ICD-10-CM | POA: Diagnosis present

## 2017-08-22 LAB — URINALYSIS, COMPLETE (UACMP) WITH MICROSCOPIC
Bacteria, UA: NONE SEEN
Bilirubin Urine: NEGATIVE
Glucose, UA: NEGATIVE mg/dL
Hgb urine dipstick: NEGATIVE
Ketones, ur: NEGATIVE mg/dL
Leukocytes, UA: NEGATIVE
Nitrite: NEGATIVE
Protein, ur: 100 mg/dL — AB
Specific Gravity, Urine: 1.015 (ref 1.005–1.030)
Squamous Epithelial / LPF: NONE SEEN (ref 0–5)
pH: 6 (ref 5.0–8.0)

## 2017-08-22 LAB — BLOOD GAS, ARTERIAL
Acid-Base Excess: 1.2 mmol/L (ref 0.0–2.0)
BICARBONATE: 28.3 mmol/L — AB (ref 20.0–28.0)
FIO2: 0.32
O2 Saturation: 98.8 %
PH ART: 7.32 — AB (ref 7.350–7.450)
Patient temperature: 37
pCO2 arterial: 55 mmHg — ABNORMAL HIGH (ref 32.0–48.0)
pO2, Arterial: 133 mmHg — ABNORMAL HIGH (ref 83.0–108.0)

## 2017-08-22 LAB — URINE DRUG SCREEN, QUALITATIVE (ARMC ONLY)
Amphetamines, Ur Screen: NOT DETECTED
Barbiturates, Ur Screen: NOT DETECTED
Benzodiazepine, Ur Scrn: NOT DETECTED
CANNABINOID 50 NG, UR ~~LOC~~: NOT DETECTED
COCAINE METABOLITE, UR ~~LOC~~: NOT DETECTED
MDMA (ECSTASY) UR SCREEN: NOT DETECTED
Methadone Scn, Ur: NOT DETECTED
Opiate, Ur Screen: POSITIVE — AB
Phencyclidine (PCP) Ur S: NOT DETECTED
TRICYCLIC, UR SCREEN: NOT DETECTED

## 2017-08-22 LAB — RENAL FUNCTION PANEL
ANION GAP: 10 (ref 5–15)
Albumin: 3.3 g/dL — ABNORMAL LOW (ref 3.5–5.0)
BUN: 40 mg/dL — AB (ref 6–20)
CALCIUM: 8.6 mg/dL — AB (ref 8.9–10.3)
CO2: 26 mmol/L (ref 22–32)
Chloride: 93 mmol/L — ABNORMAL LOW (ref 101–111)
Creatinine, Ser: 6.83 mg/dL — ABNORMAL HIGH (ref 0.44–1.00)
GFR calc Af Amer: 7 mL/min — ABNORMAL LOW (ref >60)
GFR calc non Af Amer: 6 mL/min — ABNORMAL LOW (ref >60)
GLUCOSE: 99 mg/dL (ref 65–99)
Phosphorus: 5.9 mg/dL — ABNORMAL HIGH (ref 2.5–4.6)
Potassium: 5.9 mmol/L — ABNORMAL HIGH (ref 3.5–5.1)
SODIUM: 129 mmol/L — AB (ref 135–145)

## 2017-08-22 LAB — GLUCOSE, CAPILLARY
GLUCOSE-CAPILLARY: 98 mg/dL (ref 65–99)
GLUCOSE-CAPILLARY: 102 mg/dL — AB (ref 65–99)
Glucose-Capillary: 104 mg/dL — ABNORMAL HIGH (ref 65–99)
Glucose-Capillary: 97 mg/dL (ref 65–99)
Glucose-Capillary: 99 mg/dL (ref 65–99)
Glucose-Capillary: 87 mg/dL (ref 65–99)
Glucose-Capillary: 92 mg/dL (ref 65–99)
Glucose-Capillary: 84 mg/dL (ref 65–99)
Glucose-Capillary: 104 mg/dL — ABNORMAL HIGH (ref 65–99)

## 2017-08-22 LAB — PHOSPHORUS: PHOSPHORUS: 5.6 mg/dL — AB (ref 2.5–4.6)

## 2017-08-22 LAB — CBC
HCT: 34.8 % — ABNORMAL LOW (ref 35.0–47.0)
Hemoglobin: 11.6 g/dL — ABNORMAL LOW (ref 12.0–16.0)
MCH: 29.2 pg (ref 26.0–34.0)
MCHC: 33.4 g/dL (ref 32.0–36.0)
MCV: 87.5 fL (ref 80.0–100.0)
PLATELETS: 200 10*3/uL (ref 150–440)
RBC: 3.98 MIL/uL (ref 3.80–5.20)
RDW: 17 % — ABNORMAL HIGH (ref 11.5–14.5)
WBC: 4.8 10*3/uL (ref 3.6–11.0)

## 2017-08-22 LAB — MAGNESIUM: Magnesium: 2.3 mg/dL (ref 1.7–2.4)

## 2017-08-22 LAB — BRAIN NATRIURETIC PEPTIDE: B NATRIURETIC PEPTIDE 5: 1509 pg/mL — AB (ref 0.0–100.0)

## 2017-08-22 LAB — MRSA PCR SCREENING: MRSA BY PCR: NEGATIVE

## 2017-08-22 LAB — AMMONIA: AMMONIA: 26 umol/L (ref 9–35)

## 2017-08-22 MED ORDER — MELATONIN 5 MG PO TABS
5.0000 mg | ORAL_TABLET | Freq: Every day | ORAL | Status: DC
  Administered 2017-08-23 – 2017-08-29 (×7): 5 mg via ORAL
  Filled 2017-08-22 (×9): qty 1

## 2017-08-22 MED ORDER — TRAZODONE HCL 50 MG PO TABS
25.0000 mg | ORAL_TABLET | Freq: Every evening | ORAL | Status: DC | PRN
  Administered 2017-08-23 – 2017-08-25 (×3): 25 mg via ORAL
  Filled 2017-08-22 (×3): qty 1

## 2017-08-22 MED ORDER — BENZTROPINE MESYLATE 0.5 MG PO TABS
0.5000 mg | ORAL_TABLET | Freq: Every day | ORAL | Status: DC | PRN
  Administered 2017-08-24: 0.5 mg via ORAL
  Filled 2017-08-22: qty 1

## 2017-08-22 MED ORDER — METOPROLOL TARTRATE 50 MG PO TABS
50.0000 mg | ORAL_TABLET | Freq: Two times a day (BID) | ORAL | Status: DC
  Administered 2017-08-23 – 2017-08-30 (×14): 50 mg via ORAL
  Filled 2017-08-22 (×14): qty 1

## 2017-08-22 MED ORDER — ONDANSETRON HCL 4 MG/2ML IJ SOLN
4.0000 mg | Freq: Four times a day (QID) | INTRAMUSCULAR | Status: DC | PRN
  Administered 2017-08-22: 4 mg via INTRAVENOUS
  Filled 2017-08-22: qty 2

## 2017-08-22 MED ORDER — HYDRALAZINE HCL 20 MG/ML IJ SOLN
10.0000 mg | INTRAMUSCULAR | Status: DC | PRN
  Administered 2017-08-22: 10 mg via INTRAVENOUS
  Filled 2017-08-22 (×2): qty 1

## 2017-08-22 MED ORDER — HEPARIN SODIUM (PORCINE) 5000 UNIT/ML IJ SOLN
5000.0000 [IU] | Freq: Three times a day (TID) | INTRAMUSCULAR | Status: DC
  Administered 2017-08-22 – 2017-08-25 (×10): 5000 [IU] via SUBCUTANEOUS
  Filled 2017-08-22 (×9): qty 1

## 2017-08-22 MED ORDER — ONDANSETRON HCL 4 MG PO TABS: 4.0000 mg | ORAL_TABLET | Freq: Four times a day (QID) | ORAL | Status: DC | PRN

## 2017-08-22 MED ORDER — ONE-A-DAY MENS PO TABS: 1.0000 | ORAL_TABLET | Freq: Every day | ORAL | Status: DC

## 2017-08-22 MED ORDER — GABAPENTIN 300 MG PO CAPS
300.0000 mg | ORAL_CAPSULE | Freq: Every day | ORAL | Status: DC
  Administered 2017-08-23 – 2017-08-30 (×8): 300 mg via ORAL
  Filled 2017-08-22 (×8): qty 1

## 2017-08-22 MED ORDER — LEVETIRACETAM 750 MG PO TABS
1500.0000 mg | ORAL_TABLET | Freq: Every day | ORAL | Status: DC
  Filled 2017-08-22: qty 2

## 2017-08-22 MED ORDER — AMLODIPINE BESYLATE 10 MG PO TABS: 10.0000 mg | ORAL_TABLET | Freq: Every day | ORAL | Status: DC

## 2017-08-22 MED ORDER — FLUTICASONE PROPIONATE 50 MCG/ACT NA SUSP
1.0000 | Freq: Every day | NASAL | Status: DC
  Administered 2017-08-23 – 2017-08-30 (×8): 1 via NASAL
  Filled 2017-08-22: qty 16

## 2017-08-22 MED ORDER — ASPIRIN 81 MG PO CHEW
81.0000 mg | CHEWABLE_TABLET | Freq: Every day | ORAL | Status: DC
  Administered 2017-08-23 – 2017-08-27 (×5): 81 mg via ORAL
  Filled 2017-08-22 (×5): qty 1

## 2017-08-22 MED ORDER — POLYETHYLENE GLYCOL 3350 17 G PO PACK
17.0000 g | PACK | Freq: Every day | ORAL | Status: DC
  Administered 2017-08-23 – 2017-08-30 (×5): 17 g via ORAL
  Filled 2017-08-22 (×5): qty 1

## 2017-08-22 MED ORDER — HYDRALAZINE HCL 20 MG/ML IJ SOLN
10.0000 mg | INTRAMUSCULAR | Status: DC | PRN
  Administered 2017-08-22: 20 mg via INTRAVENOUS
  Administered 2017-08-23: 10 mg via INTRAVENOUS
  Administered 2017-08-23: 20 mg via INTRAVENOUS
  Administered 2017-08-25 – 2017-08-27 (×2): 10 mg via INTRAVENOUS
  Filled 2017-08-22 (×4): qty 1

## 2017-08-22 MED ORDER — OXCARBAZEPINE 150 MG PO TABS
150.0000 mg | ORAL_TABLET | Freq: Every day | ORAL | Status: DC
  Administered 2017-08-23 – 2017-08-29 (×7): 150 mg via ORAL
  Filled 2017-08-22 (×9): qty 1

## 2017-08-22 MED ORDER — INSULIN ASPART 100 UNIT/ML ~~LOC~~ SOLN: 0.0000 [IU] | Freq: Three times a day (TID) | SUBCUTANEOUS | Status: DC

## 2017-08-22 MED ORDER — LISINOPRIL 10 MG PO TABS: 10.0000 mg | ORAL_TABLET | Freq: Every day | ORAL | Status: DC

## 2017-08-22 MED ORDER — DOCUSATE SODIUM 100 MG PO CAPS
100.0000 mg | ORAL_CAPSULE | Freq: Two times a day (BID) | ORAL | Status: DC
  Administered 2017-08-23 – 2017-08-28 (×8): 100 mg via ORAL
  Filled 2017-08-22 (×10): qty 1

## 2017-08-22 MED ORDER — CLONAZEPAM 0.5 MG PO TABS: 0.2500 mg | ORAL_TABLET | Freq: Every morning | ORAL | Status: DC

## 2017-08-22 MED ORDER — SEVELAMER CARBONATE 800 MG PO TABS
800.0000 mg | ORAL_TABLET | Freq: Three times a day (TID) | ORAL | Status: DC
  Administered 2017-08-23 – 2017-08-30 (×22): 800 mg via ORAL
  Filled 2017-08-22 (×24): qty 1

## 2017-08-22 MED ORDER — BISACODYL 10 MG RE SUPP
10.0000 mg | RECTAL | Status: DC | PRN
  Filled 2017-08-22: qty 1

## 2017-08-22 MED ORDER — PANTOPRAZOLE SODIUM 40 MG PO TBEC
40.0000 mg | DELAYED_RELEASE_TABLET | Freq: Every day | ORAL | Status: DC
  Administered 2017-08-23 – 2017-08-30 (×8): 40 mg via ORAL
  Filled 2017-08-22 (×8): qty 1

## 2017-08-22 MED ORDER — LACTULOSE 10 GM/15ML PO SOLN
10.0000 g | Freq: Every day | ORAL | Status: DC
  Administered 2017-08-23 – 2017-08-26 (×4): 10 g via ORAL
  Filled 2017-08-22 (×6): qty 30

## 2017-08-22 MED ORDER — BLISTEX MEDICATED EX OINT
TOPICAL_OINTMENT | CUTANEOUS | Status: DC | PRN
Start: 2017-08-22 — End: 2017-08-30
  Filled 2017-08-22 (×2): qty 6.3

## 2017-08-22 MED ORDER — NITROGLYCERIN 0.4 MG SL SUBL
0.4000 mg | SUBLINGUAL_TABLET | SUBLINGUAL | Status: DC | PRN
  Administered 2017-08-25 (×2): 0.4 mg via SUBLINGUAL

## 2017-08-22 MED ORDER — BISACODYL 5 MG PO TBEC: 5.0000 mg | DELAYED_RELEASE_TABLET | Freq: Every day | ORAL | Status: DC | PRN

## 2017-08-22 MED ORDER — CLONAZEPAM 0.5 MG PO TABS: 0.2500 mg | ORAL_TABLET | Freq: Every day | ORAL | Status: DC

## 2017-08-22 MED ORDER — ATORVASTATIN CALCIUM 20 MG PO TABS
40.0000 mg | ORAL_TABLET | Freq: Every day | ORAL | Status: DC
  Administered 2017-08-23 – 2017-08-29 (×7): 40 mg via ORAL
  Filled 2017-08-22 (×7): qty 2

## 2017-08-22 MED ORDER — CHLORHEXIDINE GLUCONATE CLOTH 2 % EX PADS: 6.0000 | MEDICATED_PAD | Freq: Every day | CUTANEOUS | Status: DC

## 2017-08-22 MED ORDER — CLONAZEPAM 0.5 MG PO TABS: 0.5000 mg | ORAL_TABLET | Freq: Every evening | ORAL | Status: DC

## 2017-08-22 MED ORDER — ESCITALOPRAM OXALATE 5 MG PO TABS
2.5000 mg | ORAL_TABLET | Freq: Every day | ORAL | Status: DC
  Administered 2017-08-23 – 2017-08-29 (×7): 2.5 mg via ORAL
  Filled 2017-08-22 (×10): qty 1

## 2017-08-22 MED ORDER — LEVETIRACETAM IN NACL 1500 MG/100ML IV SOLN
1500.0000 mg | Freq: Every day | INTRAVENOUS | Status: DC
  Administered 2017-08-22 – 2017-08-24 (×3): 1500 mg via INTRAVENOUS
  Filled 2017-08-22 (×4): qty 100

## 2017-08-22 MED ORDER — TORSEMIDE 100 MG PO TABS: 100.0000 mg | ORAL_TABLET | Freq: Every day | ORAL | Status: DC

## 2017-08-22 MED ORDER — INSULIN ASPART 100 UNIT/ML ~~LOC~~ SOLN: 0.0000 [IU] | Freq: Every day | SUBCUTANEOUS | Status: DC

## 2017-08-22 MED ORDER — LEVETIRACETAM IN NACL 1000 MG/100ML IV SOLN
1000.0000 mg | Freq: Once | INTRAVENOUS | Status: AC
  Administered 2017-08-22: 1000 mg via INTRAVENOUS
  Filled 2017-08-22: qty 100

## 2017-08-22 MED ORDER — BENZTROPINE MESYLATE 0.5 MG PO TABS
0.5000 mg | ORAL_TABLET | Freq: Two times a day (BID) | ORAL | Status: DC
  Filled 2017-08-22: qty 1

## 2017-08-22 MED ORDER — ISOSORBIDE MONONITRATE ER 30 MG PO TB24: 90.0000 mg | ORAL_TABLET | Freq: Every day | ORAL | Status: DC

## 2017-08-22 MED ORDER — LIDOCAINE 5 % EX PTCH
1.0000 | MEDICATED_PATCH | Freq: Every day | CUTANEOUS | Status: DC
  Filled 2017-08-22 (×10): qty 1

## 2017-08-22 MED ORDER — ORAL CARE MOUTH RINSE
15.0000 mL | Freq: Two times a day (BID) | OROMUCOSAL | Status: DC
  Administered 2017-08-22 – 2017-08-24 (×6): 15 mL via OROMUCOSAL

## 2017-08-22 MED ORDER — ACETAMINOPHEN 325 MG PO TABS
650.0000 mg | ORAL_TABLET | Freq: Four times a day (QID) | ORAL | Status: DC | PRN
  Administered 2017-08-24 – 2017-08-25 (×3): 650 mg via ORAL
  Filled 2017-08-22 (×3): qty 2

## 2017-08-22 MED ORDER — CLONAZEPAM 0.5 MG PO TABS
0.5000 mg | ORAL_TABLET | Freq: Every evening | ORAL | Status: DC
Start: 2017-08-22 — End: 2017-08-22

## 2017-08-22 MED ORDER — REFRESH LIQUIGEL 1 % OP SOLN: 1.0000 [drp] | Freq: Three times a day (TID) | OPHTHALMIC | Status: DC

## 2017-08-22 MED ORDER — INSULIN ASPART 100 UNIT/ML ~~LOC~~ SOLN
0.0000 [IU] | SUBCUTANEOUS | Status: DC
  Administered 2017-08-23: 2 [IU] via SUBCUTANEOUS
  Administered 2017-08-23 – 2017-08-24 (×3): 1 [IU] via SUBCUTANEOUS
  Administered 2017-08-25: 3 [IU] via SUBCUTANEOUS
  Administered 2017-08-25: 2 [IU] via SUBCUTANEOUS
  Administered 2017-08-25: 04:00:00 1 [IU] via SUBCUTANEOUS
  Administered 2017-08-25: 01:00:00 2 [IU] via SUBCUTANEOUS
  Administered 2017-08-26: 3 [IU] via SUBCUTANEOUS
  Administered 2017-08-26: 1 [IU] via SUBCUTANEOUS
  Administered 2017-08-26 (×2): 3 [IU] via SUBCUTANEOUS
  Administered 2017-08-26 – 2017-08-27 (×3): 1 [IU] via SUBCUTANEOUS
  Administered 2017-08-27: 3 [IU] via SUBCUTANEOUS
  Administered 2017-08-27 (×3): 2 [IU] via SUBCUTANEOUS
  Administered 2017-08-28: 1 [IU] via SUBCUTANEOUS
  Administered 2017-08-29: 2 [IU] via SUBCUTANEOUS
  Administered 2017-08-29: 1 [IU] via SUBCUTANEOUS
  Filled 2017-08-22 (×23): qty 1

## 2017-08-22 MED ORDER — CLONAZEPAM 0.5 MG PO TABS
0.2500 mg | ORAL_TABLET | Freq: Two times a day (BID) | ORAL | Status: DC | PRN
  Administered 2017-08-25 – 2017-08-28 (×3): 0.25 mg via ORAL
  Filled 2017-08-22 (×7): qty 1

## 2017-08-22 MED ORDER — CLOPIDOGREL BISULFATE 75 MG PO TABS
75.0000 mg | ORAL_TABLET | Freq: Every day | ORAL | Status: DC
  Administered 2017-08-23 – 2017-08-28 (×6): 75 mg via ORAL
  Filled 2017-08-22 (×6): qty 1

## 2017-08-22 MED ORDER — CLONAZEPAM 0.5 MG PO TABS
0.2500 mg | ORAL_TABLET | Freq: Every morning | ORAL | Status: DC
  Filled 2017-08-22: qty 1

## 2017-08-22 MED ORDER — POLYVINYL ALCOHOL 1.4 % OP SOLN
1.0000 [drp] | Freq: Three times a day (TID) | OPHTHALMIC | Status: DC
  Administered 2017-08-22 – 2017-08-30 (×23): 1 [drp] via OPHTHALMIC
  Filled 2017-08-22: qty 15

## 2017-08-22 MED ORDER — HYDROCODONE-ACETAMINOPHEN 5-325 MG PO TABS: 1.0000 | ORAL_TABLET | ORAL | Status: DC | PRN

## 2017-08-22 MED ORDER — ACETAMINOPHEN 650 MG RE SUPP: 650.0000 mg | Freq: Four times a day (QID) | RECTAL | Status: DC | PRN

## 2017-08-22 MED ORDER — ADULT MULTIVITAMIN W/MINERALS CH
1.0000 | ORAL_TABLET | Freq: Every day | ORAL | Status: DC
  Administered 2017-08-23 – 2017-08-30 (×7): 1 via ORAL
  Filled 2017-08-22 (×7): qty 1

## 2017-08-22 NOTE — Progress Notes (Signed)
Patient received from ED but had to be transferred to ICU.

## 2017-08-22 NOTE — Progress Notes (Signed)
HD tx end   08/22/17 1730  Vital Signs  Pulse Rate (!) 101  Pulse Rate Source Monitor  Resp 20  BP (!) 145/91  BP Location Left Arm  BP Method Automatic  Patient Position (if appropriate) Lying  Oxygen Therapy  SpO2 99 %  O2 Device Nasal Cannula  O2 Flow Rate (L/min) 2 L/min  During Hemodialysis Assessment  Dialysis Fluid Bolus Normal Saline  Bolus Amount (mL) 250 mL  Intra-Hemodialysis Comments Tx completed

## 2017-08-22 NOTE — Progress Notes (Signed)
Post HD assessment. Pt tolerated tx well without c/o or complications. Net UF 14, goal met.    08/22/17 1750  Vital Signs  Temp 98.9 F (37.2 C)  Temp Source Oral  Pulse Rate 100  Pulse Rate Source Monitor  Resp 17  BP (!) 166/104  BP Location Left Arm  BP Method Automatic  Patient Position (if appropriate) Lying  Oxygen Therapy  SpO2 99 %  O2 Device Nasal Cannula  O2 Flow Rate (L/min) 2 L/min  Dialysis Weight  Weight 108.7 kg (239 lb 10.2 oz)  Type of Weight Post-Dialysis  Post-Hemodialysis Assessment  Rinseback Volume (mL) 250 mL  KECN 77.5 V  Dialyzer Clearance Lightly streaked  Duration of HD Treatment -hour(s) 3.5 hour(s)  Hemodialysis Intake (mL) 500 mL  UF Total -Machine (mL) 514 mL  Net UF (mL) 14 mL  Tolerated HD Treatment Yes  AVG/AVF Arterial Site Held (minutes) 10 minutes  AVG/AVF Venous Site Held (minutes) 10 minutes  Education / Care Plan  Dialysis Education Provided Yes  Documented Education in Care Plan Yes  Fistula / Graft Right Upper arm Arteriovenous fistula  Placement Date/Time: 08/23/16 0915   Orientation: Right  Access Location: Upper arm  Access Type: Arteriovenous fistula  Site Condition No complications  Fistula / Graft Assessment Present;Thrill;Bruit  Status Deaccessed  Drainage Description None

## 2017-08-22 NOTE — Progress Notes (Signed)
Pre HD assessment    08/22/17 1342  Vital Signs  Temp 99.6 F (37.6 C)  Temp Source Oral  Pulse Rate 70  Pulse Rate Source Monitor  Resp 13  BP (!) 173/59  BP Location Left Arm  BP Method Automatic  Patient Position (if appropriate) Lying  Oxygen Therapy  SpO2 100 %  O2 Device Nasal Cannula  O2 Flow Rate (L/min) 2 L/min  Pain Assessment  Pain Scale Faces  Faces Pain Scale 8 (pt unable to verbalize pain, few tears in eyes)  Pain Intervention(s) RN made aware  Dialysis Weight  Weight 108.6 kg (239 lb 7.8 oz)  Type of Weight Pre-Dialysis  Time-Out for Hemodialysis  What Procedure? HD  Pt Identifiers(min of two) First/Last Name;MRN/Account#  Correct Site? Yes  Correct Side? Yes  Correct Procedure? Yes  Consents Verified? Yes  Rad Studies Available? N/A  Safety Precautions Reviewed? Yes  Biochemist, clinical Number  (3A)  Station Number  (bedside ICU 13)  UF/Alarm Test Passed  Conductivity: Meter 13.6  Conductivity: Machine  13.9  pH 7.4  Reverse Osmosis  ([id number])  Normal Saline Lot Number [id number]  Dialyzer Lot Number [id number]  Disposable Set Lot Number [id number]  Machine Temperature 98.6 F (37 C)  Immunologist and Audible Yes  Blood Lines Intact and Secured Yes  Pre Treatment Patient Checks  Vascular access used during treatment Fistula  Hepatitis B Surface Antigen Results Negative  Date Hepatitis B Surface Antigen Drawn 06/01/16  Hepatitis B Surface Antibody  (>10)  Date Hepatitis B Surface Antibody Drawn 01/06/17  Hemodialysis Consent Verified Yes  Hemodialysis Standing Orders Initiated Yes  ECG (Telemetry) Monitor On Yes  Prime Ordered Normal Saline  Length of  DialysisTreatment -hour(s) 3.5 Hour(s)  Dialyzer Elisio 17H NR  Dialysate 2K, 2.5 Ca  Dialysis Anticoagulant None  Dialysate Flow Ordered 800  Blood Flow Rate Ordered 400 mL/min  Ultrafiltration Goal 0 Liters  Pre Treatment Labs Phosphorus;Other (Comment)  Dialysis Blood Pressure  Support Ordered Normal Saline  Education / Care Plan  Dialysis Education Provided Yes  Documented Education in Care Plan Yes  Fistula / Graft Right Upper arm Arteriovenous fistula  Placement Date/Time: 08/23/16 0915   Orientation: Right  Access Location: Upper arm  Access Type: Arteriovenous fistula  Site Condition No complications  Fistula / Graft Assessment Present;Thrill;Bruit  Drainage Description None

## 2017-08-22 NOTE — Progress Notes (Signed)
HD tx start    08/22/17 1355  Vital Signs  Pulse Rate 73  Pulse Rate Source Monitor  Resp 18  BP (!) 199/60  BP Location Left Arm  BP Method Automatic  Patient Position (if appropriate) Lying  Oxygen Therapy  SpO2 99 %  O2 Device Nasal Cannula  O2 Flow Rate (L/min) 2 L/min  During Hemodialysis Assessment  Blood Flow Rate (mL/min) 385 mL/min  Arterial Pressure (mmHg) -110 mmHg  Venous Pressure (mmHg) 250 mmHg  Transmembrane Pressure (mmHg) 60 mmHg  Ultrafiltration Rate (mL/min) 140 mL/min  Dialysate Flow Rate (mL/min) 800 ml/min  Conductivity: Machine  13.9  HD Safety Checks Performed Yes  Dialysis Fluid Bolus Normal Saline  Bolus Amount (mL) 250 mL  Intra-Hemodialysis Comments Tx initiated  Fistula / Graft Right Upper arm Arteriovenous fistula  Placement Date/Time: 08/23/16 0915   Orientation: Right  Access Location: Upper arm  Access Type: Arteriovenous fistula  Status Accessed  Needle Size 15

## 2017-08-22 NOTE — Care Management (Signed)
Frequent admission. Hemodialysis T, TH, SAT Marchelle Folks with [facility] notified of presentation).  CSW consult placed as patient appears to be from [facility].

## 2017-08-22 NOTE — Progress Notes (Signed)
Patient ID: Kimberly Montgomery, female   DOB: 04-16-1965, 52 y.o.   MRN: [id number]  [facility] PROGRESS NOTE  Kimberly Montgomery KGM:[id number] DOB: 1965-04-23 DOA: 08/21/2017 PCP: Dorothey Baseman, MD  HPI/Subjective: Patient unable to respond as she normally does as per the daughter at the bedside.  Normally she is able to talk and respond  without any issues.  Currently unable to participate in conversation.  As per daughter, no new medications that she knows of.  Objective: Vitals:   08/22/17 1130 08/22/17 1342  BP: (!) 175/76 (!) 173/59  Pulse: 73 70  Resp: 14 13  Temp:  99.6 F (37.6 C)  SpO2: 96% 100%    Filed Weights   08/21/17 2152 08/22/17 0241 08/22/17 1342  Weight: 104.3 kg (230 lb) 107.4 kg (236 lb 12.4 oz) 108.6 kg (239 lb 7.8 oz)    ROS: Review of Systems  Unable to perform ROS: Acuity of condition   Exam: Physical Exam  Constitutional: She appears lethargic.  HENT:  Nose: No mucosal edema.  Mouth/Throat: No oropharyngeal exudate or posterior oropharyngeal edema.  Eyes: Pupils are equal, round, and reactive to light. Conjunctivae and lids are normal.  Neck: Carotid bruit is not present.  Cardiovascular: Regular rhythm, S1 normal, S2 normal and normal heart sounds.  Pulses:      Dorsalis pedis pulses are 2+ on the left side.  Respiratory: No accessory muscle usage. She has no decreased breath sounds. She has no wheezes. She has no rhonchi. She has no rales.  GI: Soft. Bowel sounds are normal. There is no tenderness.  Musculoskeletal:       Right knee: She exhibits no swelling.       Left ankle: She exhibits swelling.  Lymphadenopathy:    She has no cervical adenopathy.  Neurological: She appears lethargic.  Able to squeeze both my hands and wiggle her toes.  Tries to talk but is very mumbled.  Skin: Skin is warm. No rash noted. Nails show no clubbing.  Psychiatric:  Difficult to assess with her altered mental status.      Data Reviewed: Basic  Metabolic Panel: Recent Labs  Lab 08/21/17 2236 08/22/17 0633  NA 131* 129*  K 6.2* 5.9*  CL 93* 93*  CO2 26 26  GLUCOSE 90 99  BUN 37* 40*  CREATININE 6.39* 6.83*  CALCIUM 9.1 8.6*  MG  --  2.3  PHOS  --  5.9*   Liver Function Tests: Recent Labs  Lab 08/21/17 2236 08/22/17 0633  AST 15  --   ALT 9*  --   ALKPHOS 151*  --   BILITOT 0.5  --   PROT 7.5  --   ALBUMIN 3.7 3.3*    Recent Labs  Lab 08/21/17 2201 08/22/17 0633  AMMONIA 35 26   CBC: Recent Labs  Lab 08/21/17 2236 08/22/17 0633  WBC 6.0 4.8  HGB 12.4 11.6*  HCT 38.4 34.8*  MCV 88.5 87.5  PLT 260 200   Cardiac Enzymes: Recent Labs  Lab 08/21/17 2236  TROPONINI <0.03   BNP (last 3 results) Recent Labs    08/21/17 2236  BNP 1,509.0*     CBG: Recent Labs  Lab 08/22/17 0218 08/22/17 0239 08/22/17 0442 08/22/17 0731 08/22/17 1200  GLUCAP 104* 102* 97 99 87    Recent Results (from the past 240 hour(s))  MRSA PCR Screening     Status: None   Collection Time: 08/22/17  2:41 AM  Result Value Ref Range Status  MRSA by PCR NEGATIVE NEGATIVE Final    Comment:        The GeneXpert MRSA Assay (FDA approved for NASAL specimens only), is one component of a comprehensive MRSA colonization surveillance program. It is not intended to diagnose MRSA infection nor to guide or monitor treatment for MRSA infections. Performed at [facility], [address] [place], [place] [address]      Studies: Dg Chest 1 View  Result Date: 08/21/2017 CLINICAL DATA:  Hypoxia EXAM: CHEST  1 VIEW COMPARISON:  Chest radiograph 07/01/2017 FINDINGS: The image is degraded by body habitus. There is massive cardiomegaly with poor visualization of the costophrenic angles. No focal airspace consolidation or overt pulmonary edema. IMPRESSION: Massive cardiomegaly without focal airspace consolidation. Electronically Signed   By: Deatra Robinson M.D.   On: 08/21/2017 22:30   Ct Head Wo Contrast  Result  Date: 08/21/2017 CLINICAL DATA:  Altered level of consciousness. EXAM: CT HEAD WITHOUT CONTRAST TECHNIQUE: Contiguous axial images were obtained from the base of the skull through the vertex without intravenous contrast. COMPARISON:  Head CT and brain MRI 05/31/2016 FINDINGS: Brain: Brain volume is normal for age. No intracranial hemorrhage, mass effect, or midline shift. No hydrocephalus. The basilar cisterns are patent. No evidence of territorial infarct or acute ischemia. No extra-axial or intracranial fluid collection. Vascular: Atherosclerosis of skullbase vasculature without hyperdense vessel or abnormal calcification. Skull: No fracture or focal lesion. Sinuses/Orbits: Paranasal sinuses and mastoid air cells are clear. The visualized orbits are unremarkable. Bilateral cataract extraction. Other: None. IMPRESSION: Negative noncontrast head CT. Electronically Signed   By: Rubye Oaks M.D.   On: 08/21/2017 22:24    Scheduled Meds: . aspirin  81 mg Oral Daily  . atorvastatin  40 mg Oral q1800  . Chlorhexidine Gluconate Cloth  6 each Topical Q0600  . clopidogrel  75 mg Oral Q1200  . docusate sodium  100 mg Oral BID  . escitalopram  2.5 mg Oral Daily  . fluticasone  1 spray Each Nare Daily  . gabapentin  300 mg Oral Daily  . heparin  5,000 Units Subcutaneous Q8H  . insulin aspart  0-9 Units Subcutaneous Q4H  . lactulose  10 g Oral q1800  . levETIRAcetam  1,500 mg Oral Q1200  . lidocaine  1 patch Transdermal QHS  . mouth rinse  15 mL Mouth Rinse BID  . Melatonin  5 mg Oral QHS  . metoprolol tartrate  50 mg Oral BID  . multivitamin with minerals  1 tablet Oral Daily  . OXcarbazepine  150 mg Oral QHS  . pantoprazole  40 mg Oral Daily  . polyethylene glycol  17 g Oral Q1200  . polyvinyl alcohol  1 drop Both Eyes TID  . sevelamer carbonate  800 mg Oral TID AC & HS   Continuous Infusions:  Assessment/Plan:  1. Acute metabolic encephalopathy.  Unclear etiology currently.  Looks like MRI  of the brain is ordered but currently not done.  CT scan of the head was negative.  Patient is on medications that could potentially cause altered mental status.  Continue to monitor closely. 2. Hyperkalemia with end-stage renal disease.  Hemodialysis today as per nephrology 3. Type 2 diabetes mellitus on sliding scale insulin 4. Acute hypoxic respiratory failure on nasal cannula oxygen supplementation.  Pulse ox 88% on room air on presentation. 5. History of seizure on Keppra 6. Hyperlipidemia unspecified on atorvastatin 7. GERD on Protonix 8. Obesity weight loss needed  Code Status:  Code Status Orders  (From admission, onward)        Start     Ordered   08/22/17 0212  Full code  Continuous     08/22/17 0211    Code Status History    Date Active Date Inactive Code Status Order ID Comments User Context   07/01/2017 1654 07/03/2017 0404 Full Code [id number]  Enedina Finner, MD Inpatient   05/15/2017 0540 05/21/2017 1406 Full Code [id number]  Ihor Austin, MD Inpatient   05/31/2016 1606 06/11/2016 2013 Full Code [id number]  Kathlene Cote, PA-C ED   03/13/2016 2236 03/16/2016 2033 Full Code [id number]  Oralia Manis, MD ED   02/25/2016 1758 02/27/2016 2332 Full Code [id number]  Shaune Pollack, MD Inpatient   01/19/2016 0454 01/20/2016 2103 Full Code [id number]  Hillary Bow, DO ED   12/25/2015 2001 01/04/2016 2024 Full Code [id number]  Houston Siren, MD Inpatient   11/19/2015 0117 11/21/2015 1956 Full Code [id number]  Hugelmeyer, Cisco, DO Inpatient   10/06/2015 0208 10/06/2015 1832 Full Code [id number]  Ihor Austin, MD Inpatient   09/13/2015 0214 09/15/2015 1728 Full Code [id number]  Arnaldo Natal, MD Inpatient   08/24/2015 0350 08/31/2015 1847 Full Code [id number]  Ihor Austin, MD Inpatient   07/16/2015 0626 08/02/2015 1848 Full Code [id number]  Ihor Austin, MD ED   07/10/2015 0149 07/12/2015 1301 Full Code [id number]  Oralia Manis, MD Inpatient   05/12/2015 1546 05/16/2015 1735 Full Code [id number]   Auburn Bilberry, MD ED     Family Communication: Daughter at the bedside Disposition Plan: To be determined  Consultants:  Critical care specialist  Neurology  Time spent: 40 minutes  Clarrissa Shimkus [employer]

## 2017-08-22 NOTE — Significant Event (Signed)
Rapid Response Event Note  Overview: Time Called: 0219 Arrival Time: 0222 Event Type: Neurologic  Initial Focused Assessment: Pt in bed, responsive only to deep sternal rub. VSS.  Interventions:Transfer to ICU  Plan of Care (if not transferred):  Event Summary: Name of Physician Notified: maier at [phone]    at    Outcome: Transferred (Comment)  Event End Time: 0300  Ripley Bogosian A

## 2017-08-22 NOTE — Progress Notes (Signed)
Rapid Respond. No family.

## 2017-08-22 NOTE — Progress Notes (Signed)
[facility], [place] 08/22/17  Subjective:   Patient was brought is from [facility]   Objective:  Vital signs in last 24 hours:  Temp:  [97.7 F (36.5 C)-98.6 F (37 C)] 98.1 F (36.7 C) (05/24 0800) Pulse Rate:  [51-69] 69 (05/24 1000) Resp:  [10-20] 13 (05/24 1000) BP: (104-170)/(45-90) 168/81 (05/24 1000) SpO2:  [88 %-100 %] 99 % (05/24 1000) Weight:  [104.3 kg (230 lb)-107.4 kg (236 lb 12.4 oz)] 107.4 kg (236 lb 12.4 oz) (05/24 0241)  Weight change:  Filed Weights   08/21/17 2152 08/22/17 0241  Weight: 104.3 kg (230 lb) 107.4 kg (236 lb 12.4 oz)    Intake/Output:   No intake or output data in the 24 hours ending 08/22/17 1020   Physical Exam: General: Chronically ill appearing   HEENT Ancteric, dry oral mucus membranes  Neck supple  Pulm/lungs Clear , Cornelius O2  CVS/Heart Regular rhythm  Abdomen:  Soft, NT  Extremities: Rt BKA  Neurologic: Moaning, able to follow few commands, Appears Aphasic  Skin: No acute rashes  Access: Rt Upper arm AVF       Basic Metabolic Panel:  Recent Labs  Lab 08/21/17 2236 08/22/17 0633  NA 131* 129*  K 6.2* 5.9*  CL 93* 93*  CO2 26 26  GLUCOSE 90 99  BUN 37* 40*  CREATININE 6.39* 6.83*  CALCIUM 9.1 8.6*  MG  --  2.3  PHOS  --  5.9*     CBC: Recent Labs  Lab 08/21/17 2236 08/22/17 0633  WBC 6.0 4.8  HGB 12.4 11.6*  HCT 38.4 34.8*  MCV 88.5 87.5  PLT 260 200      Lab Results  Component Value Date   HEPBSAG Negative 06/01/2016      Microbiology:  Recent Results (from the past 240 hour(s))  MRSA PCR Screening     Status: None   Collection Time: 08/22/17  2:41 AM  Result Value Ref Range Status   MRSA by PCR NEGATIVE NEGATIVE Final    Comment:        The GeneXpert MRSA Assay (FDA approved for NASAL specimens only), is one component of a comprehensive MRSA colonization surveillance program. It is not intended to diagnose MRSA infection nor to guide or monitor  treatment for MRSA infections. Performed at [facility], [address] [place], [place] [address]     Coagulation Studies: No results for input(s): LABPROT, INR in the last 72 hours.  Urinalysis: Recent Labs    08/21/17 1300  COLORURINE YELLOW*  LABSPEC 1.015  PHURINE 6.0  GLUCOSEU 50*  HGBUR NEGATIVE  BILIRUBINUR NEGATIVE  KETONESUR NEGATIVE  PROTEINUR >=300*  NITRITE NEGATIVE  LEUKOCYTESUR NEGATIVE      Imaging: Dg Chest 1 View  Result Date: 08/21/2017 CLINICAL DATA:  Hypoxia EXAM: CHEST  1 VIEW COMPARISON:  Chest radiograph 07/01/2017 FINDINGS: The image is degraded by body habitus. There is massive cardiomegaly with poor visualization of the costophrenic angles. No focal airspace consolidation or overt pulmonary edema. IMPRESSION: Massive cardiomegaly without focal airspace consolidation. Electronically Signed   By: Deatra Robinson M.D.   On: 08/21/2017 22:30   Ct Head Wo Contrast  Result Date: 08/21/2017 CLINICAL DATA:  Altered level of consciousness. EXAM: CT HEAD WITHOUT CONTRAST TECHNIQUE: Contiguous axial images were obtained from the base of the skull through the vertex without intravenous contrast. COMPARISON:  Head CT and brain MRI 05/31/2016 FINDINGS: Brain: Brain volume is normal for age. No intracranial hemorrhage,  mass effect, or midline shift. No hydrocephalus. The basilar cisterns are patent. No evidence of territorial infarct or acute ischemia. No extra-axial or intracranial fluid collection. Vascular: Atherosclerosis of skullbase vasculature without hyperdense vessel or abnormal calcification. Skull: No fracture or focal lesion. Sinuses/Orbits: Paranasal sinuses and mastoid air cells are clear. The visualized orbits are unremarkable. Bilateral cataract extraction. Other: None. IMPRESSION: Negative noncontrast head CT. Electronically Signed   By: Rubye Oaks M.D.   On: 08/21/2017 22:24     Medications:    . aspirin  81 mg Oral Daily  .  atorvastatin  40 mg Oral q1800  . clopidogrel  75 mg Oral Q1200  . docusate sodium  100 mg Oral BID  . escitalopram  2.5 mg Oral Daily  . fluticasone  1 spray Each Nare Daily  . gabapentin  300 mg Oral Daily  . heparin  5,000 Units Subcutaneous Q8H  . insulin aspart  0-9 Units Subcutaneous Q4H  . lactulose  10 g Oral q1800  . levETIRAcetam  1,500 mg Oral Q1200  . lidocaine  1 patch Transdermal QHS  . mouth rinse  15 mL Mouth Rinse BID  . Melatonin  5 mg Oral QHS  . metoprolol tartrate  50 mg Oral BID  . multivitamin with minerals  1 tablet Oral Daily  . OXcarbazepine  150 mg Oral QHS  . pantoprazole  40 mg Oral Daily  . polyethylene glycol  17 g Oral Q1200  . polyvinyl alcohol  1 drop Both Eyes TID  . sevelamer carbonate  800 mg Oral TID AC & HS   acetaminophen **OR** acetaminophen, benztropine, bisacodyl, bisacodyl, clonazePAM **AND** [DISCONTINUED] clonazePAM, HYDROcodone-acetaminophen, nitroGLYCERIN, ondansetron **OR** ondansetron (ZOFRAN) IV, traZODone  Assessment/ Plan:  52 y.o. caucaisan female  female with end stage renal disease on hemodialysis, COPD/asthma, diabetes mellitus type II, congestive heart failure, coronary artery disease. 5/24- admitted for altered mental status   [facility]/ [address]/ MWF 107kg Right AVF  1. End Stage Renal Disease with hyperkalemia - HD today - Per outpatient tech, she was in normal mental state last week  2. AOCKD - Hgb 11.6 - monitor  3. SHPTH - Monitor Phos  4. Altered mental status - neurology evaluation pending - non contrast CT is negative    LOS: 0 Sary Bogie Thedore Mins 5/24/201910:20 AM  [facility] [place], [place] [phone]  Note: This note was prepared with Dragon dictation. Any transcription errors are unintentional

## 2017-08-22 NOTE — Consult Note (Signed)
Reason for Consult: AMS Referring Physician: Renae Gloss  CC: AMS   HPI: Kimberly Montgomery is an 52 y.o. female NH resident, with a known history of end-stage renal disease on hemodialysis, CAD, CHF, diabetes, bipolar disorder and other comorbidity presented with confusion/agitation and being altered.   Pt was found to have hyperkalemia, and creat at 6.2. No acute abnormality on [facility]     Past Medical History:  Diagnosis Date  . Anginal pain (HCC)   . Anxiety   . Bipolar disorder (HCC)   . CAD (coronary artery disease)   . CHF (congestive heart failure) (HCC)   . Chronic lower back pain   . Depression   . Endometriosis   . ESRD (end stage renal disease) on dialysis (HCC)    "[facility]; [address]; [place]; TTS" (01/19/2016)  . Gastroparesis   . GERD (gastroesophageal reflux disease)   . History of blood transfusion "several"   "my blood would get low; low RBC"  . History of hiatal hernia   . HLD (hyperlipidemia)   . Hypertension   . Migraine    "monthly" (01/19/2016)  . Myocardial infarction (HCC) 2017   "~ 3 wks ago" (01/19/2016)  . Renal disorder   . Renal insufficiency   . Seizures (HCC) 07/2015   "I've only had the 1; don't know what from" (01/19/2016)  . Stroke (HCC)   . Type II diabetes mellitus (HCC)     Past Surgical History:  Procedure Laterality Date  . ABDOMINAL HYSTERECTOMY     "partial"  . AV FISTULA PLACEMENT Right 08/23/2016   Procedure: ARTERIOVENOUS (AV) FISTULA CREATION  ( BRACHIAL CEPHALIC );  Surgeon: Renford Dills, MD;  Location: [facility] ORS;  Service: Vascular;  Laterality: Right;  . BELOW KNEE LEG AMPUTATION Right 2010?  [name] CARDIAC CATHETERIZATION Right 07/10/2015   Procedure: Left Heart Cath and Coronary Angiography;  Surgeon: Laurier Nancy, MD;  Location: [facility] INVASIVE CV LAB;  Service: Cardiovascular;  Laterality: Right;  . CARDIAC CATHETERIZATION Right 09/14/2015   Procedure: Left Heart Cath and Coronary Angiography;  Surgeon: Laurier Nancy, MD;   Location: [facility] INVASIVE CV LAB;  Service: Cardiovascular;  Laterality: Right;  . CARDIAC CATHETERIZATION N/A 09/14/2015   Procedure: Coronary Stent Intervention;  Surgeon: Alwyn Pea, MD;  Location: [facility] INVASIVE CV LAB;  Service: Cardiovascular;  Laterality: N/A;  . CARDIAC CATHETERIZATION Right 11/20/2015   Procedure: Left Heart Cath and Coronary Angiography;  Surgeon: Laurier Nancy, MD;  Location: [facility] INVASIVE CV LAB;  Service: Cardiovascular;  Laterality: Right;  . CATARACT EXTRACTION, BILATERAL    . CORONARY ANGIOPLASTY WITH STENT PLACEMENT  <2017   @ [facility]/notes 07/03/2013  . CORONARY ANGIOPLASTY WITH STENT PLACEMENT    . DIALYSIS/PERMA CATHETER REMOVAL N/A 11/12/2016   Procedure: DIALYSIS/PERMA CATHETER REMOVAL;  Surgeon: Renford Dills, MD;  Location: [facility] INVASIVE CV LAB;  Service: Cardiovascular;  Laterality: N/A;  . HYSTEROTOMY    . PERIPHERAL VASCULAR CATHETERIZATION N/A 08/30/2015   Procedure: Dialysis/Perma Catheter Insertion;  Surgeon: Annice Needy, MD;  Location: [facility] INVASIVE CV LAB;  Service: Cardiovascular;  Laterality: N/A;  . PERIPHERAL VASCULAR CATHETERIZATION N/A 01/01/2016   Procedure: Dialysis/Perma Catheter Insertion;  Surgeon: Annice Needy, MD;  Location: [facility] INVASIVE CV LAB;  Service: Cardiovascular;  Laterality: N/A;  . PERITONEAL CATHETER INSERTION  11/03/2013   [name] 11/03/2013  . PERITONEAL CATHETER REMOVAL  01/01/2016   "took the one from May out; put new PD cath in" (01/19/2016)  . PERITONEAL CATHETER REMOVAL  11/03/2013; 02/11/2014   [name] 11/03/2013; Removal of tunneled catheter/notes 02/11/2014  . SALPINGOOPHORECTOMY Right    [name] 07/03/2013  . TEE WITHOUT CARDIOVERSION N/A 06/07/2016   Procedure: TRANSESOPHAGEAL ECHOCARDIOGRAM (TEE);  Surgeon: Chrystie Nose, MD;  Location: [facility] ENDOSCOPY;  Service: Cardiovascular;  Laterality: N/A;  . TUBAL LIGATION      Family History  Problem Relation Age of Onset  . CAD Unknown   . Diabetes Unknown   . Bipolar  disorder Unknown   . Cervical cancer Mother   . Other Father        heart bypass  . Breast cancer Neg Hx     Social History:  reports that she quit smoking about 2 years ago. Her smoking use included cigarettes. She smoked 0.75 packs per day. She has never used smokeless tobacco. She reports that she does not drink alcohol or use drugs.  Allergies  Allergen Reactions  . Cephalosporins Anaphylaxis    Patient has tolerated meropenem.   [name] Penicillins Anaphylaxis and Other (See Comments)    Has patient had a PCN reaction causing immediate rash, facial/tongue/throat swelling, SOB or lightheadedness with hypotension: Yes Has patient had a PCN reaction causing severe rash involving mucus membranes or skin necrosis: No Has patient had a PCN reaction that required hospitalization No Has patient had a PCN reaction occurring within the last 10 years: No If all of the above answers are "NO", then may proceed with Cephalosporin use.  . Reglan [Metoclopramide] Other (See Comments)    Severe EPS (lip, head, body tremors) March 2018  . Risperdal [Risperidone] Other (See Comments)    Severe EPS (lip, head, body tremors) March 2018  . Lamictal [Lamotrigine] Other (See Comments)    Reaction:  Hallucinations  . Phenergan [Promethazine Hcl] Nausea And Vomiting    Sensitivity to medicine  . Pravastatin Other (See Comments)    Reaction:  Muscle pain   . Sulfa Antibiotics Other (See Comments)    Reaction:  Unknown     Medications: I have reviewed the patient's current medications.  ROS: Unable to obtain due to agitation   Physical Examination: Blood pressure (!) 175/76, pulse 73, temperature 98.1 F (36.7 C), temperature source Oral, resp. rate 14, height 5\' 5"  (1.651 m), weight 236 lb 12.4 oz (107.4 kg), last menstrual period 03/16/2015, SpO2 96 %.   Neurological Examination  Mental Status: Confused, agitation  Cranial Nerves: II: Discs flat bilaterally; Visual fields grossly normal, pupils  equal, round, reactive to light and accommodation III,IV, VI: ptosis not present, extra-ocular motions intact bilaterally V,VII: smile symmetric, facial light touch sensation normal bilaterally VIII: hearing normal bilaterally IX,X: gag reflex present XI: bilateral shoulder shrug XII: midline tongue extension Motor: Right : Upper extremity   4/5    Left:     Upper extremity   4/5  Lower extremity   4/5     BKA Lower extremity   4/5 Tone and bulk:normal tone throughout; no atrophy noted Sensory: Pinprick and light touch intact throughout, bilaterally Deep Tendon Reflexes: 1+ and symmetric throughout Plantars: Right: downgoing   Left: downgoing Cerebellar: normal finger-to-nose, normal rapid alternating movements and normal heel-to-shin test Gait: not tested      Laboratory Studies:   Basic Metabolic Panel: Recent Labs  Lab 08/21/17 2236 08/22/17 0633  NA 131* 129*  K 6.2* 5.9*  CL 93* 93*  CO2 26 26  GLUCOSE 90 99  BUN 37* 40*  CREATININE 6.39* 6.83*  CALCIUM 9.1 8.6*  MG  --  2.3  PHOS  --  5.9*    Liver Function Tests: Recent Labs  Lab 08/21/17 2236 08/22/17 0633  AST 15  --   ALT 9*  --   ALKPHOS 151*  --   BILITOT 0.5  --   PROT 7.5  --   ALBUMIN 3.7 3.3*   No results for input(s): LIPASE, AMYLASE in the last 168 hours. Recent Labs  Lab 08/21/17 2201 08/22/17 0633  AMMONIA 35 26    CBC: Recent Labs  Lab 08/21/17 2236 08/22/17 0633  WBC 6.0 4.8  HGB 12.4 11.6*  HCT 38.4 34.8*  MCV 88.5 87.5  PLT 260 200    Cardiac Enzymes: Recent Labs  Lab 08/21/17 2236  TROPONINI <0.03    BNP: Invalid input(s): POCBNP  CBG: Recent Labs  Lab 08/22/17 0218 08/22/17 0239 08/22/17 0442 08/22/17 0731 08/22/17 1200  GLUCAP 104* 102* 97 99 87    Microbiology: Results for orders placed or performed during the hospital encounter of 08/21/17  MRSA PCR Screening     Status: None   Collection Time: 08/22/17  2:41 AM  Result Value Ref Range Status    MRSA by PCR NEGATIVE NEGATIVE Final    Comment:        The GeneXpert MRSA Assay (FDA approved for NASAL specimens only), is one component of a comprehensive MRSA colonization surveillance program. It is not intended to diagnose MRSA infection nor to guide or monitor treatment for MRSA infections. Performed at [facility], [address] [place], [place] [address]     Coagulation Studies: No results for input(s): LABPROT, INR in the last 72 hours.  Urinalysis:  Recent Labs  Lab 08/21/17 1300 08/22/17 1130  COLORURINE YELLOW* YELLOW*  LABSPEC 1.015 1.015  PHURINE 6.0 6.0  GLUCOSEU 50* NEGATIVE  HGBUR NEGATIVE NEGATIVE  BILIRUBINUR NEGATIVE NEGATIVE  KETONESUR NEGATIVE NEGATIVE  PROTEINUR >=300* 100*  NITRITE NEGATIVE NEGATIVE  LEUKOCYTESUR NEGATIVE NEGATIVE    Lipid Panel:     Component Value Date/Time   CHOL 215 (H) 11/19/2015 0617   TRIG 186 (H) 11/19/2015 0617   HDL 63 11/19/2015 0617   CHOLHDL 3.4 11/19/2015 0617   VLDL 37 11/19/2015 0617   LDLCALC 115 (H) 11/19/2015 0617    HgbA1C:  Lab Results  Component Value Date   HGBA1C 5.7 (H) 05/15/2017    Urine Drug Screen:      Component Value Date/Time   LABOPIA POSITIVE (A) 08/21/2017 1300   COCAINSCRNUR NONE DETECTED 08/21/2017 1300   LABBENZ NONE DETECTED 08/21/2017 1300   AMPHETMU NONE DETECTED 08/21/2017 1300   THCU NONE DETECTED 08/21/2017 1300   LABBARB NONE DETECTED 08/21/2017 1300    Alcohol Level: No results for input(s): ETH in the last 168 hours.  Other results: EKG: normal EKG, normal sinus rhythm, unchanged from previous tracings.  Imaging: Dg Chest 1 View  Result Date: 08/21/2017 CLINICAL DATA:  Hypoxia EXAM: CHEST  1 VIEW COMPARISON:  Chest radiograph 07/01/2017 FINDINGS: The image is degraded by body habitus. There is massive cardiomegaly with poor visualization of the costophrenic angles. No focal airspace consolidation or overt pulmonary edema. IMPRESSION: Massive  cardiomegaly without focal airspace consolidation. Electronically Signed   By: Deatra Robinson M.D.   On: 08/21/2017 22:30   Ct Head Wo Contrast  Result Date: 08/21/2017 CLINICAL DATA:  Altered level of consciousness. EXAM: CT HEAD WITHOUT CONTRAST TECHNIQUE: Contiguous axial images were obtained from the base of the skull through the vertex without intravenous contrast. COMPARISON:  Head CT and brain MRI 05/31/2016 FINDINGS: Brain: Brain volume is normal for age. No intracranial hemorrhage, mass effect, or midline shift. No hydrocephalus. The basilar cisterns are patent. No evidence of territorial infarct or acute ischemia. No extra-axial or intracranial fluid collection. Vascular: Atherosclerosis of skullbase vasculature without hyperdense vessel or abnormal calcification. Skull: No fracture or focal lesion. Sinuses/Orbits: Paranasal sinuses and mastoid air cells are clear. The visualized orbits are unremarkable. Bilateral cataract extraction. Other: None. IMPRESSION: Negative noncontrast head CT. Electronically Signed   By: Rubye Oaks M.D.   On: 08/21/2017 22:24     Assessment/Plan:  52 y.o. female NH resident, with a known history of end-stage renal disease on hemodialysis, CAD, CHF, diabetes, bipolar disorder and other comorbidity presented with confusion/agitation and being altered.   Pt was found to have hyperkelemia, and creat at 6.2. No acute abnormality on CTH   - likely metabolic encephalopathy - - HD today - don't think seizure related - would hold off any further imaging at this time.  08/22/2017, 1:17 PM

## 2017-08-22 NOTE — Progress Notes (Signed)
PT Cancellation Note  Patient Details Name: Kimberly Montgomery MRN: [id number] DOB: November 17, 1965   Cancelled Treatment:    Reason Eval/Treat Not Completed: Medical issues which prohibited therapy; Pt's Ka currently 5.9 which falls outside guidelines for participation with PT services.  Will attempt to see pt at a future date/time as medically appropriate.     Ovidio Hanger PT, DPT 08/22/17, 1:01 PM

## 2017-08-22 NOTE — Progress Notes (Signed)
Post HD assessment    08/22/17 1745  Neurological  Level of Consciousness Alert  Orientation Level Oriented to person  Respiratory  Respiratory Pattern Regular;Unlabored  Chest Assessment Chest expansion symmetrical  Cardiac  ECG Monitor Yes  Vascular  R Radial Pulse +2  L Radial Pulse +2  Integumentary  Integumentary (WDL) X  Skin Color Appropriate for ethnicity  Musculoskeletal  Musculoskeletal (WDL) X  Generalized Weakness Yes  Assistive Device None  GU Assessment  Genitourinary (WDL) X  Genitourinary Symptoms  (HD)  Psychosocial  Psychosocial (WDL) X  Patient Behaviors Cooperative;Calm;Sad;Tearful  Emotional support given Given to patient;Given to patient's family

## 2017-08-22 NOTE — Consult Note (Signed)
PULMONARY / CRITICAL CARE MEDICINE   Name: Kimberly Montgomery MRN: [id number] DOB: 12/23/1965    ADMISSION DATE:  08/21/2017   CONSULTATION DATE: 08/22/2017  REFERRING MD:  Dr Caryn Bee  Reason: Altered mental status  HISTORY OF PRESENT ILLNESS:   This is a 52 year old female with end-stage renal disease on hemodialysis who presented to the ED via EMS from [facility] with acute change in mental status.  History is obtained from ED records and from EMS records as patient is still confused and unable to provide a history.  Per nursing home staff report, patient was noted to be drowsy all day and at about 2040, symptoms got worse and blood work done at the facility showed hypokalemia with potassium level of 6.2.  Therefore EMS was called.  When EMS arrived, patient was hypoxic with SPO2 of 80% on room air.  Her blood pressure was 160/80.  She will open her eyes but was not responding verbally.  CT head in the ED was negative for any acute infarct or bleed Her chest x-ray showed cardiomegaly without any infiltrates.  Patient was hospitalized from 07/01/2017 to 07/02/2017 with hyperkalemia.  At the time, she had missed dialysis.  It is unclear if this time she did miss any dialysis sessions.  Her last echo was in March 2018 and showed an EF of 55 to 60% with normal systolic function  PAST MEDICAL HISTORY :  She  has a past medical history of Anginal pain (HCC), Anxiety, Bipolar disorder (HCC), CAD (coronary artery disease), CHF (congestive heart failure) (HCC), Chronic lower back pain, Depression, Endometriosis, ESRD (end stage renal disease) on dialysis (HCC), Gastroparesis, GERD (gastroesophageal reflux disease), History of blood transfusion ("several"), History of hiatal hernia, HLD (hyperlipidemia), Hypertension, Migraine, Myocardial infarction (HCC) (2017), Renal disorder, Renal insufficiency, Seizures (HCC) (07/2015), Stroke (HCC), and Type II diabetes mellitus (HCC).  PAST SURGICAL HISTORY: She  has a  past surgical history that includes Hysterotomy; Below knee leg amputation (Right, 2010?); Cardiac catheterization (N/A, 08/30/2015); Cardiac catheterization (N/A, 01/01/2016); Abdominal hysterectomy; Tubal ligation; Peritoneal catheter removal (01/01/2016); Salpingoophorectomy (Right); Cardiac catheterization (Right, 07/10/2015); Cardiac catheterization (Right, 09/14/2015); Cardiac catheterization (N/A, 09/14/2015); Cardiac catheterization (Right, 11/20/2015); Coronary angioplasty with stent (<2017); Peritoneal catheter insertion (11/03/2013); Peritoneal catheter removal (11/03/2013; 02/11/2014); TEE without cardioversion (N/A, 06/07/2016); Cataract extraction, bilateral; Coronary angioplasty with stent; AV fistula placement (Right, 08/23/2016); and DIALYSIS/PERMA CATHETER REMOVAL (N/A, 11/12/2016).  Allergies  Allergen Reactions  . Cephalosporins Anaphylaxis    Patient has tolerated meropenem.   [name] Penicillins Anaphylaxis and Other (See Comments)    Has patient had a PCN reaction causing immediate rash, facial/tongue/throat swelling, SOB or lightheadedness with hypotension: Yes Has patient had a PCN reaction causing severe rash involving mucus membranes or skin necrosis: No Has patient had a PCN reaction that required hospitalization No Has patient had a PCN reaction occurring within the last 10 years: No If all of the above answers are "NO", then may proceed with Cephalosporin use.  . Reglan [Metoclopramide] Other (See Comments)    Severe EPS (lip, head, body tremors) March 2018  . Risperdal [Risperidone] Other (See Comments)    Severe EPS (lip, head, body tremors) March 2018  . Lamictal [Lamotrigine] Other (See Comments)    Reaction:  Hallucinations  . Phenergan [Promethazine Hcl] Nausea And Vomiting    Sensitivity to medicine  . Pravastatin Other (See Comments)    Reaction:  Muscle pain   . Sulfa Antibiotics Other (See Comments)    Reaction:  Unknown  No current facility-administered  medications on file prior to encounter.    Current Outpatient Medications on File Prior to Encounter  Medication Sig  . acetaminophen (TYLENOL) 500 MG tablet Take 500 mg by mouth every 6 (six) hours as needed for moderate pain or fever.  [name] albuterol (PROVENTIL) (2.5 MG/3ML) 0.083% nebulizer solution Take 2.5 mg by nebulization every 4 (four) hours as needed for wheezing or shortness of breath.  [name] amLODipine (NORVASC) 10 MG tablet Take 10 mg by mouth daily.   [name] aspirin 81 MG chewable tablet Chew 81 mg by mouth daily.  [name] atorvastatin (LIPITOR) 40 MG tablet Take 40 mg by mouth daily at 6 PM.  . benztropine (COGENTIN) 0.5 MG tablet Take 0.5 mg by mouth 2 (two) times daily.  . bisacodyl (DULCOLAX) 10 MG suppository Place 10 mg rectally as needed for moderate constipation.  . carboxymethylcellulose (REFRESH) 1 % ophthalmic solution Place 1 drop into both eyes 3 (three) times daily.  . clonazePAM (KLONOPIN) 0.5 MG tablet Take  tablet (0.25MG ) by mouth every morning and 1 tablet (0.5MG ) by mouth every evening  . clopidogrel (PLAVIX) 75 MG tablet Take 75 mg by mouth daily at 12 noon.   . escitalopram (LEXAPRO) 5 MG tablet Take 2.5 mg by mouth daily.  . fluticasone (FLONASE) 50 MCG/ACT nasal spray Place 1 spray into both nostrils daily.   [name] gabapentin (NEURONTIN) 300 MG capsule Take 300 mg by mouth daily.   . hydrALAZINE (APRESOLINE) 50 MG tablet Take 1 tablet (50MG ) by mouth three times daily (Sunday, Tuesday, Thursday & Saturday) and take 1 tablet (50MG ) by mouth twice daily (Monday, Wednesday & Friday).  [name] HYDROcodone-acetaminophen (NORCO/VICODIN) 5-325 MG tablet Take 1 tablet by mouth every 6 (six) hours as needed for moderate pain or severe pain.  [name] insulin aspart (NOVOLOG) 100 UNIT/ML injection Inject 7 Units into the skin 3 (three) times daily with meals.   . insulin detemir (LEVEMIR) 100 UNIT/ML injection Inject 4 Units into the skin daily.   . isosorbide mononitrate (IMDUR) 30 MG 24 hr tablet  Take 90 mg by mouth daily at 12 noon.   . lactulose (CHRONULAC) 10 GM/15ML solution Take 10 g by mouth daily at 6 PM.   . levETIRAcetam (KEPPRA) 750 MG tablet Take 2 tablets (1,500 mg total) by mouth daily at 12 noon.  . lidocaine (LIDODERM) 5 % Place 1 patch onto the skin at bedtime.   . lidocaine (LMX) 4 % cream Apply 1 application topically daily as needed (pain). To right arm fistula  . linagliptin (TRADJENTA) 5 MG TABS tablet Take 5 mg by mouth daily.  [name] lisinopril (PRINIVIL,ZESTRIL) 10 MG tablet Take 10 mg by mouth daily at 12 noon.  . Melatonin 3 MG TABS Take 3 mg by mouth at bedtime.  . metoprolol (LOPRESSOR) 50 MG tablet Take 1 tablet (50 mg total) by mouth 2 (two) times daily.  . multivitamin (ONE-A-DAY MEN'S) TABS tablet Take 1 tablet by mouth daily.  . nitroGLYCERIN (NITROSTAT) 0.4 MG SL tablet Place 0.4 mg under the tongue every 5 (five) minutes as needed for chest pain. Reported on 07/10/2015  . omeprazole (PRILOSEC) 20 MG capsule Take 20 mg by mouth 2 (two) times daily.   . ondansetron (ZOFRAN ODT) 4 MG disintegrating tablet Take 1 tablet (4 mg total) by mouth every 6 (six) hours as needed for nausea or vomiting.  . OXcarbazepine (TRILEPTAL) 150 MG tablet Take 150 mg by mouth at bedtime.  . polyethylene glycol (MIRALAX /  GLYCOLAX) packet Take 17 g by mouth daily. (Patient taking differently: Take 17 g by mouth daily at 12 noon. )  . senna (SENOKOT) 8.6 MG TABS tablet Take 2 tablets by mouth 2 (two) times daily.   . sevelamer carbonate (RENVELA) 800 MG tablet Take 800 mg by mouth 4 (four) times daily -  before meals and at bedtime.   . torsemide (DEMADEX) 100 MG tablet Take 100 mg by mouth daily.     FAMILY HISTORY:  Her indicated that her mother is deceased. She indicated that her father is alive. She indicated that the status of her neg hx is unknown.   SOCIAL HISTORY: She  reports that she quit smoking about 2 years ago. Her smoking use included cigarettes. She smoked 0.75  packs per day. She has never used smokeless tobacco. She reports that she does not drink alcohol or use drugs.  REVIEW OF SYSTEMS:   Obtain as patient is severely confused and barely responsive  SUBJECTIVE:   VITAL SIGNS: BP 114/81   Pulse 67   Temp 97.7 F (36.5 C) (Axillary)   Resp 13   Ht 5\' 5"  (1.651 m)   Wt 236 lb 12.4 oz (107.4 kg)   LMP 03/16/2015 Comment: partial hysterectomy  SpO2 100%   BMI 39.40 kg/m   HEMODYNAMICS:    VENTILATOR SETTINGS:    INTAKE / OUTPUT: No intake/output data recorded.  PHYSICAL EXAMINATION: General: No nourished, well-developed, in no acute distress Neuro: Somnolent, opens eyes to voice and touch, not following commands, withdraws both extremities to noxious stimulus, pupils are equal and reactive, corneal reflexes intact HEENT: Head is normocephalic and atraumatic, PERRLA, trachea midline, no JVD Cardiovascular: Apical pulse regular, S1-S2, no murmur regurg or gallop, +2 pulses, no edema Lungs: Bilateral breath sounds, diminished in the bases, no wheezes or rhonchi, bibasilar crackles Abdomen: Nondistended, normal bowel sounds in all 4 quadrants, palpation reveals no organomegaly Musculoskeletal: 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  LABS:  BMET Recent Labs  Lab 08/21/17 2236  NA 131*  K 6.2*  CL 93*  CO2 26  BUN 37*  CREATININE 6.39*  GLUCOSE 90    Electrolytes Recent Labs  Lab 08/21/17 2236  CALCIUM 9.1    CBC Recent Labs  Lab 08/21/17 2236  WBC 6.0  HGB 12.4  HCT 38.4  PLT 260    Coag's No results for input(s): APTT, INR in the last 168 hours.  Sepsis Markers No results for input(s): LATICACIDVEN, PROCALCITON, O2SATVEN in the last 168 hours.  ABG Recent Labs  Lab 08/22/17 0339  PHART 7.32*  PCO2ART 55*  PO2ART 133*    Liver Enzymes Recent Labs  Lab 08/21/17 2236  AST 15  ALT 9*  ALKPHOS 151*  BILITOT 0.5  ALBUMIN 3.7    Cardiac Enzymes Recent Labs  Lab  08/21/17 2236  TROPONINI <0.03    Glucose Recent Labs  Lab 08/22/17 0203 08/22/17 0218 08/22/17 0239  GLUCAP 98 104* 102*    Imaging Dg Chest 1 View  Result Date: 08/21/2017 CLINICAL DATA:  Hypoxia EXAM: CHEST  1 VIEW COMPARISON:  Chest radiograph 07/01/2017 FINDINGS: The image is degraded by body habitus. There is massive cardiomegaly with poor visualization of the costophrenic angles. No focal airspace consolidation or overt pulmonary edema. IMPRESSION: Massive cardiomegaly without focal airspace consolidation. Electronically Signed   By: Deatra Robinson M.D.   On: 08/21/2017 22:30   Ct Head Wo Contrast  Result Date: 08/21/2017 CLINICAL  DATA:  Altered level of consciousness. EXAM: CT HEAD WITHOUT CONTRAST TECHNIQUE: Contiguous axial images were obtained from the base of the skull through the vertex without intravenous contrast. COMPARISON:  Head CT and brain MRI 05/31/2016 FINDINGS: Brain: Brain volume is normal for age. No intracranial hemorrhage, mass effect, or midline shift. No hydrocephalus. The basilar cisterns are patent. No evidence of territorial infarct or acute ischemia. No extra-axial or intracranial fluid collection. Vascular: Atherosclerosis of skullbase vasculature without hyperdense vessel or abnormal calcification. Skull: No fracture or focal lesion. Sinuses/Orbits: Paranasal sinuses and mastoid air cells are clear. The visualized orbits are unremarkable. Bilateral cataract extraction. Other: None. IMPRESSION: Negative noncontrast head CT. Electronically Signed   By: Rubye Oaks M.D.   On: 08/21/2017 22:24    SIGNIFICANT EVENTS: 08/21/17: ED with acute change in mental status  LINES/TUBES: Peripheral IVs  DISCUSSION: 53 year old female with end-stage renal disease on hemodialysis presenting with hyperkalemia, and a acute change in mental status of unclear etiology.  Her urinalysis, chest x-ray, CT head, and labs do not show any abnormalities that can account for  her change in mental status.  It is unclear if patient has been compliant with hemodialysis sessions.  There is a high suspicion for acute uremic encephalopathy but other etiologies such as seizure activity given patient's history of seizures, ischemic CVA, and hypoglycemia cannot be completely ruled out.  ASSESSMENT  Altered mental status Hyperkalemia Acute hypoxic/hypercarbic respiratory failure End-stage renal disease on hemodialysis  Seizure disorder History of: Type 2 diabetes mellitus, hypertension, GERD, CHF, CAD, and CVA  PLAN Hemodynamic monitoring per ICU protocol Given 1 g of calcium, 1 amp of dextrose and 5 units of IV insulin the ED Neurochecks per protocol Nephrology consult for emergent hemodialysis-spoke with Dr. Thedore Mins Stat labs EKG in the morning Supplemental oxygen to maintain SPO2 greater than 90% Keep n.p.o. until mental status improves Loaded with Keppra in the ED; continue Keppra 1500 mg daily Continue all home medications once patient's mental status improves Blood glucose monitoring with sliding scale insulin coverage GI and DVT prophylaxis: Subcu heparin and p.o. Protonix  FAMILY  - Updates: No family at bedside.  Will update when available Amram Maya S. [facility] ANP-BC Pulmonary and Critical Care Medicine [facility] Pager [phone] or [phone]  NB: This document was prepared using Dragon voice recognition software and may include unintentional dictation errors.   08/22/2017, 3:56 AM

## 2017-08-22 NOTE — Progress Notes (Signed)
SLP Cancellation Note  Patient Details Name: Kimberly Montgomery MRN: [id number] DOB: 24-Dec-1965   Cancelled treatment:       Reason Eval/Treat Not Completed: Patient at procedure or test/unavailable(chart reviewed; consulted NSG re: pt's status. ). Pt's initiation of HD is ongoing now therefore unable to see pt for BSE. ST services will f/u w/ pt in the morning for BSE. MD stated she had ordered an MRI d/t pt's Cognitive status/decline currently. Recommend general aspiration precautions. NSG agreed.     Jerilynn Som, MS, CCC-SLP Jayce Boyko 08/22/2017, 2:22 PM

## 2017-08-22 NOTE — Progress Notes (Signed)
Pre HD assessment    08/22/17 1345  Neurological  Level of Consciousness Responds to Voice  Orientation Level Oriented to person  Respiratory  Respiratory Pattern Regular;Unlabored  Chest Assessment Chest expansion symmetrical  Cardiac  ECG Monitor Yes  Vascular  R Radial Pulse +2  L Radial Pulse +2  Integumentary  Integumentary (WDL) X  Skin Color Appropriate for ethnicity  Musculoskeletal  Musculoskeletal (WDL) X  Generalized Weakness Yes  Assistive Device None  GU Assessment  Genitourinary (WDL) X  Genitourinary Symptoms  (HD)  Psychosocial  Psychosocial (WDL) X  Patient Behaviors Cooperative;Calm;Sad;Tearful  Emotional support given Given to patient;Given to patient's family

## 2017-08-22 NOTE — ED Notes (Signed)
ED TO INPATIENT HANDOFF REPORT  Name/Age/Gender Kimberly Montgomery 52 y.o. female  Code Status Code Status History    Date Active Date Inactive Code Status Order ID Comments User Context   07/01/2017 1654 07/03/2017 0404 Full Code [id number]  Fritzi Mandes, MD Inpatient   05/15/2017 0540 05/21/2017 1406 Full Code [id number]  Saundra Shelling, MD Inpatient   05/31/2016 1606 06/11/2016 2013 Full Code [id number]  Germain Osgood, PA-C ED   03/13/2016 2236 03/16/2016 2033 Full Code [id number]  Lance Coon, MD ED   02/25/2016 1758 02/27/2016 2332 Full Code [id number]  Demetrios Loll, MD Inpatient   01/19/2016 0454 01/20/2016 2103 Full Code [id number]  Etta Quill, DO ED   12/25/2015 2001 01/04/2016 2024 Full Code [id number]  Henreitta Leber, MD Inpatient   11/19/2015 0117 11/21/2015 1956 Full Code [id number]  Hugelmeyer, Alexis, DO Inpatient   10/06/2015 0208 10/06/2015 1832 Full Code [id number]  Saundra Shelling, MD Inpatient   09/13/2015 0214 09/15/2015 1728 Full Code [id number]  Harrie Foreman, MD Inpatient   08/24/2015 0350 08/31/2015 1847 Full Code [id number]  Saundra Shelling, MD Inpatient   07/16/2015 0626 08/02/2015 1848 Full Code [id number]  Saundra Shelling, MD ED   07/10/2015 0149 07/12/2015 1301 Full Code [id number]  Lance Coon, MD Inpatient   05/12/2015 1546 05/16/2015 1735 Full Code [id number]  Dustin Flock, MD ED      Home/SNF/Other Nursing Home  Chief Complaint altered mental status  Level of Care/Admitting Diagnosis ED Disposition    ED Disposition Condition [place]: [place] [100120]  Level of Care: Med-Surg [16]  Diagnosis: Acute encephalopathy [334356]  Admitting Physician: Amelia Jo [[id number]]  Attending Physician: Amelia Jo [phone]  Estimated length of stay: past midnight tomorrow  Certification:: I certify this patient will need inpatient services for at least 2 midnights  PT Class (Do Not Modify): Inpatient [101]  PT Acc Code (Do Not  Modify): Private [1]       Medical History Past Medical History:  Diagnosis Date  . Anginal pain ([place])   . Anxiety   . Bipolar disorder ([place])   . CAD (coronary artery disease)   . CHF (congestive heart failure) ([place])   . Chronic lower back pain   . Depression   . Endometriosis   . ESRD (end stage renal disease) on dialysis ([place])    "[facility]; [place]; [place]; TTS" (01/19/2016)  . Gastroparesis   . GERD (gastroesophageal reflux disease)   . History of blood transfusion "several"   "my blood would get low; low RBC"  . History of hiatal hernia   . HLD (hyperlipidemia)   . Hypertension   . Migraine    "monthly" (01/19/2016)  . Myocardial infarction ([place]) 2017   "~ 3 wks ago" (01/19/2016)  . Renal disorder   . Renal insufficiency   . Seizures ([place]) 07/2015   "I've only had the 1; don't know what from" (01/19/2016)  . Stroke ([place])   . Type II diabetes mellitus ([facility])     Allergies Allergies  Allergen Reactions  . Cephalosporins Anaphylaxis    Patient has tolerated meropenem.   [name] Penicillins Anaphylaxis and Other (See Comments)    Has patient had a PCN reaction causing immediate rash, facial/tongue/throat swelling, SOB or lightheadedness with hypotension: Yes Has patient had a PCN reaction causing severe rash involving mucus membranes or skin necrosis: No Has patient had a PCN reaction that required hospitalization No Has patient had a PCN reaction occurring  within the last 10 years: No If all of the above answers are "NO", then may proceed with Cephalosporin use.  . Reglan [Metoclopramide] Other (See Comments)    Severe EPS (lip, head, body tremors) March 2018  . Risperdal [Risperidone] Other (See Comments)    Severe EPS (lip, head, body tremors) March 2018  . Lamictal [Lamotrigine] Other (See Comments)    Reaction:  Hallucinations  . Phenergan [Promethazine Hcl] Nausea And Vomiting    Sensitivity to medicine  . Pravastatin Other (See Comments)    Reaction:   Muscle pain   . Sulfa Antibiotics Other (See Comments)    Reaction:  Unknown     IV Location/Drains/Wounds Patient Lines/Drains/Airways Status   Active Line/Drains/Airways    Name:   Placement date:   Placement time:   Site:   Days:   Peripheral IV 08/21/17 Left Hand   08/21/17    2238    Hand   1   Fistula / Graft Right Upper arm Arteriovenous fistula   08/23/16    0915    Upper arm   364   Hemodialysis Catheter Left Subclavian Double-lumen   -    -    Subclavian             Labs/Imaging Results for orders placed or performed during the hospital encounter of 08/21/17 (from the past 48 hour(s))  Ammonia     Status: None   Collection Time: 08/21/17 10:01 PM  Result Value Ref Range   Ammonia 35 9 - 35 umol/L    Comment: Performed at [facility], [address] [place], [place] [address]  Blood gas, venous     Status: Abnormal   Collection Time: 08/21/17 10:36 PM  Result Value Ref Range   pH, Ven 7.32 7.250 - 7.430   pCO2, Ven 57 44.0 - 60.0 mmHg   pO2, Ven 51.0 (H) 32.0 - 45.0 mmHg   Bicarbonate 29.4 (H) 20.0 - 28.0 mmol/L   Acid-Base Excess 2.0 0.0 - 2.0 mmol/L   O2 Saturation 82.4 %   Patient temperature 37.0    Collection site VENOUS    Sample type VENOUS     Comment: Performed at [facility], [address] [place], [place] [address]  CBC     Status: Abnormal   Collection Time: 08/21/17 10:36 PM  Result Value Ref Range   WBC 6.0 3.6 - 11.0 K/uL   RBC 4.34 3.80 - 5.20 MIL/uL   Hemoglobin 12.4 12.0 - 16.0 g/dL   HCT 38.4 35.0 - 47.0 %   MCV 88.5 80.0 - 100.0 fL   MCH 28.6 26.0 - 34.0 pg   MCHC 32.3 32.0 - 36.0 g/dL   RDW 16.8 (H) 11.5 - 14.5 %   Platelets 260 150 - 440 K/uL    Comment: Performed at [facility], [address] [place], [place] [address]  Comprehensive metabolic panel     Status: Abnormal   Collection Time: 08/21/17 10:36 PM  Result Value Ref Range   Sodium 131 (L) 135 - 145 mmol/L   Potassium 6.2 (H) 3.5 - 5.1  mmol/L   Chloride 93 (L) 101 - 111 mmol/L   CO2 26 22 - 32 mmol/L   Glucose, Bld 90 65 - 99 mg/dL   BUN 37 (H) 6 - 20 mg/dL   Creatinine, Ser 6.39 (H) 0.44 - 1.00 mg/dL   Calcium 9.1 8.9 - 10.3 mg/dL   Total Protein 7.5 6.5 - 8.1 g/dL  Albumin 3.7 3.5 - 5.0 g/dL   AST 15 15 - 41 U/L   ALT 9 (L) 14 - 54 U/L   Alkaline Phosphatase 151 (H) 38 - 126 U/L   Total Bilirubin 0.5 0.3 - 1.2 mg/dL   GFR calc non Af Amer 7 (L) >60 mL/min   GFR calc Af Amer 8 (L) >60 mL/min    Comment: (NOTE) The eGFR has been calculated using the CKD EPI equation. This calculation has not been validated in all clinical situations. eGFR's persistently <60 mL/min signify possible Chronic Kidney Disease.    Anion gap 12 5 - 15    Comment: Performed at [facility], [address] [place], [place] [address]  Troponin I     Status: None   Collection Time: 08/21/17 10:36 PM  Result Value Ref Range   Troponin I <0.03 <0.03 ng/mL    Comment: Performed at [facility], [address] [place], [place] [address]  Brain natriuretic peptide     Status: Abnormal   Collection Time: 08/21/17 10:36 PM  Result Value Ref Range   B Natriuretic Peptide 1,509.0 (H) 0.0 - 100.0 pg/mL    Comment: Performed at [facility], [address] [place], [place] [address]   Dg Chest 1 View  Result Date: 08/21/2017 CLINICAL DATA:  Hypoxia EXAM: CHEST  1 VIEW COMPARISON:  Chest radiograph 07/01/2017 FINDINGS: The image is degraded by body habitus. There is massive cardiomegaly with poor visualization of the costophrenic angles. No focal airspace consolidation or overt pulmonary edema. IMPRESSION: Massive cardiomegaly without focal airspace consolidation. Electronically Signed   By: Ulyses Jarred M.D.   On: 08/21/2017 22:30   Ct Head Wo Contrast  Result Date: 08/21/2017 CLINICAL DATA:  Altered level of consciousness. EXAM: CT HEAD WITHOUT CONTRAST TECHNIQUE: Contiguous axial images were obtained from the  base of the skull through the vertex without intravenous contrast. COMPARISON:  Head CT and brain MRI 05/31/2016 FINDINGS: Brain: Brain volume is normal for age. No intracranial hemorrhage, mass effect, or midline shift. No hydrocephalus. The basilar cisterns are patent. No evidence of territorial infarct or acute ischemia. No extra-axial or intracranial fluid collection. Vascular: Atherosclerosis of skullbase vasculature without hyperdense vessel or abnormal calcification. Skull: No fracture or focal lesion. Sinuses/Orbits: Paranasal sinuses and mastoid air cells are clear. The visualized orbits are unremarkable. Bilateral cataract extraction. Other: None. IMPRESSION: Negative noncontrast head CT. Electronically Signed   By: Jeb Levering M.D.   On: 08/21/2017 22:24    Pending Labs Unresulted Labs (From admission, onward)   Start     Ordered   08/21/17 2158  Urinalysis, Complete w Microscopic  Once,   STAT     08/21/17 2157   Signed and Held  HIV antibody (Routine Testing)  Once,   R     Signed and Held   Signed and Held  Basic metabolic panel  Tomorrow morning,   R     Signed and Held   Signed and Held  CBC  Tomorrow morning,   R     Signed and Held      Vitals/Pain Today's Vitals   08/21/17 2240 08/21/17 2353 08/22/17 0000 08/22/17 0100  BP: (!) 149/89 (!) 162/72 (!) 148/90 (!) 135/47  Pulse: (!) 58 (!) 56  67  Resp: '15 17 15 12  ' Temp:      TempSrc:      SpO2: 99% 96%  100%  Weight:        Isolation  Precautions No active isolations  Medications Medications  benztropine mesylate (COGENTIN) injection 0.5 mg (0.5 mg Intramuscular Given 08/21/17 2349)  albuterol (PROVENTIL) (2.5 MG/3ML) 0.083% nebulizer solution 2.5 mg (2.5 mg Nebulization Given 08/22/17 0012)  dextrose 50 % solution 50 mL (50 mLs Intravenous Given 08/22/17 0011)  insulin aspart (novoLOG) injection 5 Units (5 Units Intravenous Given 08/22/17 0011)  calcium gluconate 1 g in sodium chloride 0.9 % 100 mL IVPB (0 g  Intravenous Stopped 08/22/17 0055)  LORazepam (ATIVAN) injection 0.5 mg (0.5 mg Intravenous Given 08/22/17 0008)  levETIRAcetam (KEPPRA) IVPB 1000 mg/100 mL premix (1,000 mg Intravenous New Bag/Given 08/22/17 0056)    Mobility non-ambulatory

## 2017-08-22 NOTE — H&P (Signed)
[facility] [place] at [facility]   PATIENT NAME: Kimberly Montgomery    MR#:  [id number]  DATE OF BIRTH:  1965-09-06  DATE OF ADMISSION:  08/21/2017  PRIMARY CARE PHYSICIAN: Dorothey Baseman, MD   REQUESTING/REFERRING PHYSICIAN:   CHIEF COMPLAINT:   Chief Complaint  Patient presents with  . Altered Mental Status    HISTORY OF PRESENT ILLNESS: Unita Bonde  is a 52 y.o. female, NH resident, with a known history of end-stage renal disease on hemodialysis, CAD, CHF, diabetes, bipolar disorder and other comorbidities. Patient is not able to provide history due to altered mental status.  Most of the information was taken from reviewing the medical records and from discussion with emergency room physician. [facility] due to acute change in mental status and lethargy.  Per nursing home staff report, patient was noted to be drowsy all day, gradually getting worse.  Blood test done at the facility showed hyperkalemia with potassium level is 6.2.  Her oxygen saturation was noted to be low in 80s, on room air. Blood test done emergency room are remarkable for sodium level of 131, potassium elevated at 6.2 and creatinine elevated at 6.39.   Brain CT, reviewed by myself, is negative for any acute infarct or bleed.  The chest x-ray shows cardiomegaly without any infiltrates. And is admitted for further evaluation and treatment.   PAST MEDICAL HISTORY:   Past Medical History:  Diagnosis Date  . Anginal pain (HCC)   . Anxiety   . Bipolar disorder (HCC)   . CAD (coronary artery disease)   . CHF (congestive heart failure) (HCC)   . Chronic lower back pain   . Depression   . Endometriosis   . ESRD (end stage renal disease) on dialysis (HCC)    "[facility]; [address]; [place]; TTS" (01/19/2016)  . Gastroparesis   . GERD (gastroesophageal reflux disease)   . History of blood transfusion "several"   "my blood would get low; low RBC"  .  History of hiatal hernia   . HLD (hyperlipidemia)   . Hypertension   . Migraine    "monthly" (01/19/2016)  . Myocardial infarction (HCC) 2017   "~ 3 wks ago" (01/19/2016)  . Renal disorder   . Renal insufficiency   . Seizures (HCC) 07/2015   "I've only had the 1; don't know what from" (01/19/2016)  . Stroke (HCC)   . Type II diabetes mellitus (HCC)     PAST SURGICAL HISTORY:  Past Surgical History:  Procedure Laterality Date  . ABDOMINAL HYSTERECTOMY     "partial"  . AV FISTULA PLACEMENT Right 08/23/2016   Procedure: ARTERIOVENOUS (AV) FISTULA CREATION  ( BRACHIAL CEPHALIC );  Surgeon: Renford Dills, MD;  Location: [facility] ORS;  Service: Vascular;  Laterality: Right;  . BELOW KNEE LEG AMPUTATION Right 2010?  [name] CARDIAC CATHETERIZATION Right 07/10/2015   Procedure: Left Heart Cath and Coronary Angiography;  Surgeon: Laurier Nancy, MD;  Location: [facility] INVASIVE CV LAB;  Service: Cardiovascular;  Laterality: Right;  . CARDIAC CATHETERIZATION Right 09/14/2015   Procedure: Left Heart Cath and Coronary Angiography;  Surgeon: Laurier Nancy, MD;  Location: [facility] INVASIVE CV LAB;  Service: Cardiovascular;  Laterality: Right;  . CARDIAC CATHETERIZATION N/A 09/14/2015   Procedure: Coronary Stent Intervention;  Surgeon: Alwyn Pea, MD;  Location: [facility] INVASIVE CV LAB;  Service: Cardiovascular;  Laterality: N/A;  . CARDIAC CATHETERIZATION Right 11/20/2015   Procedure: Left Heart Cath and Coronary Angiography;  Surgeon: Laurier Nancy, MD;  Location: [facility] INVASIVE CV LAB;  Service: Cardiovascular;  Laterality: Right;  . CATARACT EXTRACTION, BILATERAL    . CORONARY ANGIOPLASTY WITH STENT PLACEMENT  <2017   @ [facility]/notes 07/03/2013  . CORONARY ANGIOPLASTY WITH STENT PLACEMENT    . DIALYSIS/PERMA CATHETER REMOVAL N/A 11/12/2016   Procedure: DIALYSIS/PERMA CATHETER REMOVAL;  Surgeon: Renford Dills, MD;  Location: [facility] INVASIVE CV LAB;  Service: Cardiovascular;  Laterality: N/A;  . HYSTEROTOMY     . PERIPHERAL VASCULAR CATHETERIZATION N/A 08/30/2015   Procedure: Dialysis/Perma Catheter Insertion;  Surgeon: Annice Needy, MD;  Location: [facility] INVASIVE CV LAB;  Service: Cardiovascular;  Laterality: N/A;  . PERIPHERAL VASCULAR CATHETERIZATION N/A 01/01/2016   Procedure: Dialysis/Perma Catheter Insertion;  Surgeon: Annice Needy, MD;  Location: [facility] INVASIVE CV LAB;  Service: Cardiovascular;  Laterality: N/A;  . PERITONEAL CATHETER INSERTION  11/03/2013   [name] 11/03/2013  . PERITONEAL CATHETER REMOVAL  01/01/2016   "took the one from May out; put new PD cath in" (01/19/2016)  . PERITONEAL CATHETER REMOVAL  11/03/2013; 02/11/2014   [name] 11/03/2013; Removal of tunneled catheter/notes 02/11/2014  . SALPINGOOPHORECTOMY Right    [name] 07/03/2013  . TEE WITHOUT CARDIOVERSION N/A 06/07/2016   Procedure: TRANSESOPHAGEAL ECHOCARDIOGRAM (TEE);  Surgeon: Chrystie Nose, MD;  Location: [facility] ENDOSCOPY;  Service: Cardiovascular;  Laterality: N/A;  . TUBAL LIGATION      SOCIAL HISTORY:  Social History   Tobacco Use  . Smoking status: Former Smoker    Packs/day: 0.75    Types: Cigarettes    Last attempt to quit: 07/01/2015    Years since quitting: 2.1  . Smokeless tobacco: Never Used  Substance Use Topics  . Alcohol use: No    Alcohol/week: 0.0 oz    FAMILY HISTORY:  Family History  Problem Relation Age of Onset  . CAD Unknown   . Diabetes Unknown   . Bipolar disorder Unknown   . Cervical cancer Mother   . Other Father        heart bypass  . Breast cancer Neg Hx     DRUG ALLERGIES:  Allergies  Allergen Reactions  . Cephalosporins Anaphylaxis    Patient has tolerated meropenem.   [name] Penicillins Anaphylaxis and Other (See Comments)    Has patient had a PCN reaction causing immediate rash, facial/tongue/throat swelling, SOB or lightheadedness with hypotension: Yes Has patient had a PCN reaction causing severe rash involving mucus membranes or skin necrosis: No Has patient had a PCN reaction  that required hospitalization No Has patient had a PCN reaction occurring within the last 10 years: No If all of the above answers are "NO", then may proceed with Cephalosporin use.  . Reglan [Metoclopramide] Other (See Comments)    Severe EPS (lip, head, body tremors) March 2018  . Risperdal [Risperidone] Other (See Comments)    Severe EPS (lip, head, body tremors) March 2018  . Lamictal [Lamotrigine] Other (See Comments)    Reaction:  Hallucinations  . Phenergan [Promethazine Hcl] Nausea And Vomiting    Sensitivity to medicine  . Pravastatin Other (See Comments)    Reaction:  Muscle pain   . Sulfa Antibiotics Other (See Comments)    Reaction:  Unknown     REVIEW OF SYSTEMS:   Unable to obtain due to patient's confusion and lethargy.  MEDICATIONS AT HOME:  Prior to Admission medications   Medication Sig Start Date End Date Taking? Authorizing Provider  acetaminophen (TYLENOL) 500 MG tablet Take 500 mg  by mouth every 6 (six) hours as needed for moderate pain or fever.   Yes [provider]  albuterol (PROVENTIL) (2.5 MG/3ML) 0.083% nebulizer solution Take 2.5 mg by nebulization every 4 (four) hours as needed for wheezing or shortness of breath.   Yes [provider]  amLODipine (NORVASC) 10 MG tablet Take 10 mg by mouth daily.    Yes [provider]  aspirin 81 MG chewable tablet Chew 81 mg by mouth daily.   Yes [provider]  atorvastatin (LIPITOR) 40 MG tablet Take 40 mg by mouth daily at 6 PM.   Yes [provider]  benztropine (COGENTIN) 0.5 MG tablet Take 0.5 mg by mouth 2 (two) times daily.   Yes [provider]  bisacodyl (DULCOLAX) 10 MG suppository Place 10 mg rectally as needed for moderate constipation.   Yes [provider]  carboxymethylcellulose (REFRESH) 1 % ophthalmic solution Place 1 drop into both eyes 3 (three) times daily.   Yes [provider]  clonazePAM (KLONOPIN) 0.5 MG tablet Take   tablet (0.25MG ) by mouth every morning and 1 tablet (0.5MG ) by mouth every evening   Yes [provider]  clopidogrel (PLAVIX) 75 MG tablet Take 75 mg by mouth daily at 12 noon.    Yes [provider]  escitalopram (LEXAPRO) 5 MG tablet Take 2.5 mg by mouth daily.   Yes [provider]  fluticasone (FLONASE) 50 MCG/ACT nasal spray Place 1 spray into both nostrils daily.    Yes [provider]  gabapentin (NEURONTIN) 300 MG capsule Take 300 mg by mouth daily.    Yes [provider]  hydrALAZINE (APRESOLINE) 50 MG tablet Take 1 tablet (50MG ) by mouth three times daily (Sunday, Tuesday, Thursday & Saturday) and take 1 tablet (50MG ) by mouth twice daily (Monday, Wednesday & Friday).   Yes [provider]  HYDROcodone-acetaminophen (NORCO/VICODIN) 5-325 MG tablet Take 1 tablet by mouth every 6 (six) hours as needed for moderate pain or severe pain. 05/20/17  Yes Delfino Lovett, MD  insulin aspart (NOVOLOG) 100 UNIT/ML injection Inject 7 Units into the skin 3 (three) times daily with meals.    Yes [provider]  insulin detemir (LEVEMIR) 100 UNIT/ML injection Inject 4 Units into the skin daily.    Yes [provider]  isosorbide mononitrate (IMDUR) 30 MG 24 hr tablet Take 90 mg by mouth daily at 12 noon.    Yes [provider]  lactulose (CHRONULAC) 10 GM/15ML solution Take 10 g by mouth daily at 6 PM.    Yes [provider]  levETIRAcetam (KEPPRA) 750 MG tablet Take 2 tablets (1,500 mg total) by mouth daily at 12 noon. 05/20/17  Yes Delfino Lovett, MD  lidocaine (LIDODERM) 5 % Place 1 patch onto the skin at bedtime.    Yes [provider]  lidocaine (LMX) 4 % cream Apply 1 application topically daily as needed (pain). To right arm fistula   Yes [provider]  linagliptin (TRADJENTA) 5 MG TABS tablet Take 5 mg by mouth daily.   Yes [provider]  lisinopril (PRINIVIL,ZESTRIL) 10 MG tablet Take  10 mg by mouth daily at 12 noon.   Yes [provider]  Melatonin 3 MG TABS Take 3 mg by mouth at bedtime.   Yes [provider]  metoprolol (LOPRESSOR) 50 MG tablet Take 1 tablet (50 mg total) by mouth 2 (two) times daily. 09/15/15  Yes Milagros Loll, MD  multivitamin (ONE-A-DAY MEN'S) TABS  tablet Take 1 tablet by mouth daily.   Yes [provider]  nitroGLYCERIN (NITROSTAT) 0.4 MG SL tablet Place 0.4 mg under the tongue every 5 (five) minutes as needed for chest pain. Reported on 07/10/2015   Yes [provider]  omeprazole (PRILOSEC) 20 MG capsule Take 20 mg by mouth 2 (two) times daily.    Yes [provider]  ondansetron (ZOFRAN ODT) 4 MG disintegrating tablet Take 1 tablet (4 mg total) by mouth every 6 (six) hours as needed for nausea or vomiting. 07/16/15  Yes Sharyn Creamer, MD  OXcarbazepine (TRILEPTAL) 150 MG tablet Take 150 mg by mouth at bedtime.   Yes [provider]  polyethylene glycol (MIRALAX / GLYCOLAX) packet Take 17 g by mouth daily. Patient taking differently: Take 17 g by mouth daily at 12 noon.  08/02/15  Yes Adrian Saran, MD  senna (SENOKOT) 8.6 MG TABS tablet Take 2 tablets by mouth 2 (two) times daily.    Yes [provider]  sevelamer carbonate (RENVELA) 800 MG tablet Take 800 mg by mouth 4 (four) times daily -  before meals and at bedtime.    Yes [provider]  torsemide (DEMADEX) 100 MG tablet Take 100 mg by mouth daily.    Yes [provider]      PHYSICAL EXAMINATION:   VITAL SIGNS: Blood pressure (!) 169/66, pulse 61, temperature 98.6 F (37 C), temperature source Oral, resp. rate 12, weight 104.3 kg (230 lb), last menstrual period 03/16/2015, SpO2 100 %.  GENERAL:  52 y.o.-year-old patient lying in the bed with no acute distress.  She looks chronically ill but nontoxic.  She is lethargic, hard to arouse. EYES: Pupils equal, round, reactive to light and accommodation. No scleral icterus.   HEENT: Head atraumatic, normocephalic. Oropharynx and nasopharynx clear.  NECK:  Supple, no jugular venous distention. No thyroid enlargement, no tenderness.  LUNGS: Reduced breath sounds bilaterally, no wheezing.  Bibasilar crackles are noted.  No use of accessory muscles of respiration.  CARDIOVASCULAR: S1, S2 normal. No S3/S4. ABDOMEN: Soft, nontender, nondistended. Bowel sounds present. . No organomegaly or mass.  EXTREMITIES: Right BKA is noted.  No edema. NEUROLOGIC EXAM: Is limited.  Patient is somnolent, opens eyes to voice and touch, not following commands.  She is moving spontaneously all her extremities; no focal weakness appreciated. PSYCHIATRIC: The patient is lethargic, hard to arouse.  SKIN: No obvious rash, lesion, or ulcer.   LABORATORY PANEL:   CBC Recent Labs  Lab 08/21/17 2236  WBC 6.0  HGB 12.4  HCT 38.4  PLT 260  MCV 88.5  MCH 28.6  MCHC 32.3  RDW 16.8*   ------------------------------------------------------------------------------------------------------------------  Chemistries  Recent Labs  Lab 08/21/17 2236  NA 131*  K 6.2*  CL 93*  CO2 26  GLUCOSE 90  BUN 37*  CREATININE 6.39*  CALCIUM 9.1  AST 15  ALT 9*  ALKPHOS 151*  BILITOT 0.5   ------------------------------------------------------------------------------------------------------------------ estimated creatinine clearance is 13.1 mL/min (A) (by C-G formula based on SCr of 6.39 mg/dL (H)). ------------------------------------------------------------------------------------------------------------------ No results for input(s): TSH, T4TOTAL, T3FREE, THYROIDAB in the last 72 hours.  Invalid input(s): FREET3   Coagulation profile No results for input(s): INR, PROTIME in the last 168 hours. ------------------------------------------------------------------------------------------------------------------- No results for input(s): DDIMER in the last 72  hours. -------------------------------------------------------------------------------------------------------------------  Cardiac Enzymes Recent Labs  Lab 08/21/17 2236  TROPONINI <0.03   ------------------------------------------------------------------------------------------------------------------ Invalid input(s): POCBNP  ---------------------------------------------------------------------------------------------------------------  Urinalysis    Component Value Date/Time   COLORURINE  YELLOW (A) 08/21/2017 1300   APPEARANCEUR CLEAR (A) 08/21/2017 1300   APPEARANCEUR Clear 12/31/2013 1818   LABSPEC 1.015 08/21/2017 1300   LABSPEC 1.009 12/31/2013 1818   PHURINE 6.0 08/21/2017 1300   GLUCOSEU 50 (A) 08/21/2017 1300   GLUCOSEU 150 mg/dL 57/84/6962 9528   HGBUR NEGATIVE 08/21/2017 1300   BILIRUBINUR NEGATIVE 08/21/2017 1300   BILIRUBINUR Negative 12/31/2013 1818   KETONESUR NEGATIVE 08/21/2017 1300   PROTEINUR >=300 (A) 08/21/2017 1300   NITRITE NEGATIVE 08/21/2017 1300   LEUKOCYTESUR NEGATIVE 08/21/2017 1300   LEUKOCYTESUR Negative 12/31/2013 1818     RADIOLOGY: Dg Chest 1 View  Result Date: 08/21/2017 CLINICAL DATA:  Hypoxia EXAM: CHEST  1 VIEW COMPARISON:  Chest radiograph 07/01/2017 FINDINGS: The image is degraded by body habitus. There is massive cardiomegaly with poor visualization of the costophrenic angles. No focal airspace consolidation or overt pulmonary edema. IMPRESSION: Massive cardiomegaly without focal airspace consolidation. Electronically Signed   By: Deatra Robinson M.D.   On: 08/21/2017 22:30   Ct Head Wo Contrast  Result Date: 08/21/2017 CLINICAL DATA:  Altered level of consciousness. EXAM: CT HEAD WITHOUT CONTRAST TECHNIQUE: Contiguous axial images were obtained from the base of the skull through the vertex without intravenous contrast. COMPARISON:  Head CT and brain MRI 05/31/2016 FINDINGS: Brain: Brain volume is normal for age. No intracranial  hemorrhage, mass effect, or midline shift. No hydrocephalus. The basilar cisterns are patent. No evidence of territorial infarct or acute ischemia. No extra-axial or intracranial fluid collection. Vascular: Atherosclerosis of skullbase vasculature without hyperdense vessel or abnormal calcification. Skull: No fracture or focal lesion. Sinuses/Orbits: Paranasal sinuses and mastoid air cells are clear. The visualized orbits are unremarkable. Bilateral cataract extraction. Other: None. IMPRESSION: Negative noncontrast head CT. Electronically Signed   By: Rubye Oaks M.D.   On: 08/21/2017 22:24    EKG: Orders placed or performed during the hospital encounter of 08/21/17  . ED EKG  . ED EKG    IMPRESSION AND PLAN:  1.  Acute encephalopathy, of unclear etiology.  It could be related to medication side effects, she takes quite a few medications with potential for sedation, especially in the setting of her end-stage renal disease.  Will rule out stroke, seizures and avoid sedatives.  Neurology is consulted for further evaluation and treatment.  Patient is admitted to intensive care unit for close observation and management. 2.  Acute hypoxic/hypercapnic respiratory failure. 3.  End-stage renal disease, on hemodialysis.  Continue management per nephrology. 4.  Hyperkalemia, without EKG changes.  Given 1 g calcium, 1 amp of dextrose and 5 units of IV insulin in the emergency room.  Nephrology is consulted for emergency hemodialysis. 5.  Diabetes type 2.  Will monitor blood sugars every 6 hours and use insulin treatment during the hospital stay. 6.  Seizure disorder, on Keppra.  All the records are reviewed and case discussed with ED and ICU providers.   CODE STATUS: FULL    Code Status Orders  (From admission, onward)        Start     Ordered   08/22/17 0212  Full code  Continuous     08/22/17 0211    Code Status History    Date Active Date Inactive Code Status Order ID Comments User  Context   07/01/2017 1654 07/03/2017 0404 Full Code [id number]  Enedina Finner, MD Inpatient   05/15/2017 0540 05/21/2017 1406 Full Code [id number]  Ihor Austin, MD Inpatient   05/31/2016 1606 06/11/2016 2013 Full Code  [id number]  Kathlene Cote, PA-C ED   03/13/2016 2236 03/16/2016 2033 Full Code [id number]  Oralia Manis, MD ED   02/25/2016 1758 02/27/2016 2332 Full Code [id number]  Shaune Pollack, MD Inpatient   01/19/2016 0454 01/20/2016 2103 Full Code [id number]  Hillary Bow, DO ED   12/25/2015 2001 01/04/2016 2024 Full Code [id number]  Houston Siren, MD Inpatient   11/19/2015 0117 11/21/2015 1956 Full Code [id number]  Tonye Royalty, DO Inpatient   10/06/2015 0208 10/06/2015 1832 Full Code [id number]  Ihor Austin, MD Inpatient   09/13/2015 0214 09/15/2015 1728 Full Code [id number]  Arnaldo Natal, MD Inpatient   08/24/2015 0350 08/31/2015 1847 Full Code [id number]  Ihor Austin, MD Inpatient   07/16/2015 0626 08/02/2015 1848 Full Code [id number]  Ihor Austin, MD ED   07/10/2015 0149 07/12/2015 1301 Full Code [id number]  Oralia Manis, MD Inpatient   05/12/2015 1546 05/16/2015 1735 Full Code [id number]  Auburn Bilberry, MD ED       TOTAL TIME TAKING CARE OF THIS PATIENT: 50 minutes.    Cammy Copa M.D on 08/22/2017 at 2:31 AM  Between 7am to 6pm - Pager - [phone]  After 6pm go to [email or web address] - password [email or web address] [facility]  [facility]  Office  [phone]  CC: Primary care physician; Dorothey Baseman, MD

## 2017-08-23 ENCOUNTER — Inpatient Hospital Stay: Payer: Medicare HMO

## 2017-08-23 ENCOUNTER — Other Ambulatory Visit: Payer: Self-pay

## 2017-08-23 DIAGNOSIS — Z87898 Personal history of other specified conditions: Secondary | ICD-10-CM

## 2017-08-23 LAB — RENAL FUNCTION PANEL
ALBUMIN: 3.3 g/dL — AB (ref 3.5–5.0)
ANION GAP: 12 (ref 5–15)
BUN: 19 mg/dL (ref 6–20)
CALCIUM: 8.5 mg/dL — AB (ref 8.9–10.3)
CO2: 28 mmol/L (ref 22–32)
CREATININE: 4.46 mg/dL — AB (ref 0.44–1.00)
Chloride: 96 mmol/L — ABNORMAL LOW (ref 101–111)
GFR calc Af Amer: 12 mL/min — ABNORMAL LOW (ref >60)
GFR calc non Af Amer: 10 mL/min — ABNORMAL LOW (ref >60)
GLUCOSE: 100 mg/dL — AB (ref 65–99)
PHOSPHORUS: 4.7 mg/dL — AB (ref 2.5–4.6)
Potassium: 4.3 mmol/L (ref 3.5–5.1)
SODIUM: 136 mmol/L (ref 135–145)

## 2017-08-23 LAB — GLUCOSE, CAPILLARY
Glucose-Capillary: 100 mg/dL — ABNORMAL HIGH (ref 65–99)
Glucose-Capillary: 101 mg/dL — ABNORMAL HIGH (ref 65–99)
Glucose-Capillary: 127 mg/dL — ABNORMAL HIGH (ref 65–99)
Glucose-Capillary: 127 mg/dL — ABNORMAL HIGH (ref 65–99)
Glucose-Capillary: 154 mg/dL — ABNORMAL HIGH (ref 65–99)
Glucose-Capillary: 88 mg/dL (ref 65–99)

## 2017-08-23 LAB — MAGNESIUM: Magnesium: 1.8 mg/dL (ref 1.7–2.4)

## 2017-08-23 LAB — HIV ANTIBODY (ROUTINE TESTING W REFLEX): HIV Screen 4th Generation wRfx: NONREACTIVE

## 2017-08-23 NOTE — Evaluation (Signed)
Physical Therapy Evaluation Patient Details Name: Kimberly Montgomery MRN: [id number] DOB: Feb 27, 1966 Today's Date: 08/23/2017   History of Present Illness  Pt is a51 y.o.female, NH resident,with a known history of end-stage renal disease on hemodialysis,CAD, CHF,diabetes,bipolar disorder and other comorbidities.  Patient was transferred from [facility] due to acute change in mental status and lethargy.Per nursing home staff report, patient was noted to be drowsy all day, gradually getting worse.Blood test done at the facility showed hyperkalemia with potassium level is 6.2.Her oxygen saturation was noted to be low in 80s, on room air.  Blood test done in the emergency room were remarkable for sodium level of 131, potassium elevated at 6.2 and creatinine elevated at 6.39. Brain CT was negative for any acute infarct or bleed.The chest x-ray shows cardiomegaly without any infiltrates.  Pt was admitted for further evaluation and treatment.  Assessment includes: acute metabolic encephalopathy, hyperkalemia, DM II, h/o seizures on keppra, HLD, and GERD.      Clinical Impression  Pt presents with deficits in strength, transfers, mobility, gait, balance, and activity tolerance.  Pt was able to follow commands well with only one cue required 80-90% of the time although pt was unable to communicate verbally other than occasional yes/no or single word answers to questions.  Spoke to staff at [facility] by phone who were familiar with the pt's care to obtain pt's below PLOF.  Pt presents with a significant decline in functional mobility compared to her baseline including now requiring mod A for bed mobility tasks and transfers.  Pt's SpO2 on 1LO2/in dropped from 94% at baseline to a low of 87% with activity but returned quickly to the low 90s with rest, nursing notified.  Pt will benefit from PT services in a SNF setting upon discharge to safely address above deficits for decreased caregiver  assistance and eventual return to PLOF.       Follow Up Recommendations SNF;Supervision for mobility/OOB    Equipment Recommendations  None recommended by PT    Recommendations for Other Services       Precautions / Restrictions Precautions Precautions: Fall Restrictions Weight Bearing Restrictions: No      Mobility  Bed Mobility Overal bed mobility: Needs Assistance Bed Mobility: Supine to Sit;Sit to Supine;Rolling Rolling: Min assist   Supine to sit: Mod assist;Min assist Sit to supine: Mod assist;Min assist   General bed mobility comments: Verbal cues for sequencing with assist required for full upright sitting during sup to sit and for proper final positioning during sit to sup  Transfers Overall transfer level: Needs assistance Equipment used: Rolling walker (2 wheeled) Transfers: Sit to/from Stand Sit to Stand: Mod assist;+2 safety/equipment         General transfer comment: Multiple attempts made before finally coming to a complete stand; attempted LLE mini squat with pt unable to perform and sat back down to the EOB during attempt  Ambulation/Gait             General Gait Details: Unable  Stairs            Wheelchair Mobility    Modified Rankin (Stroke Patients Only)       Balance Overall balance assessment: Needs assistance Sitting-balance support: Feet unsupported;Bilateral upper extremity supported Sitting balance-Leahy Scale: Fair Sitting balance - Comments: Pt required min A for stability and full upright sitting initially but improved to SBA during the session   Standing balance support: Bilateral upper extremity supported Standing balance-Leahy Scale: Poor Standing balance comment: Heavy  reliance on the RW in standing                             Pertinent Vitals/Pain Pain Assessment: No/denies pain    Home Living Family/patient expects to be discharged to:: Assisted living([facility] called for assist with  history)               Home Equipment: Walker - 2 wheels      Prior Function Level of Independence: Needs assistance(Prior function from ALF staff by phone)   Gait / Transfers Assistance Needed: Pt able to amb 50-100' with a RW and SBA with her prosthetic limb donned, Ind with transfers bed to/from chair, Ind with bed mobility, no recent falls  ADL's / Homemaking Assistance Needed: Pt required set-up for ADLs but then was Ind with bathing and dressing        Hand Dominance        Extremity/Trunk Assessment   Upper Extremity Assessment Upper Extremity Assessment: Generalized weakness    Lower Extremity Assessment Lower Extremity Assessment: Generalized weakness       Communication   Communication: Expressive difficulties(Pt able to follow 1-step commands 80-90% of the time with only one cue but unable to communicate verbally other than occasional yes/no or one word answers to questions)  Cognition Arousal/Alertness: Awake/alert Behavior During Therapy: Flat affect Overall Cognitive Status: No family/caregiver present to determine baseline cognitive functioning                                        General Comments      Exercises Total Joint Exercises Ankle Circles/Pumps: AROM;Left;10 reps Quad Sets: Strengthening;Both;10 reps Gluteal Sets: Strengthening;Both;10 reps Heel Slides: Left;5 reps Hip ABduction/ADduction: AROM;Both;10 reps Straight Leg Raises: AROM;Both;10 reps Long Arc Quad: AROM;Both;10 reps Knee Flexion: AROM;Both;10 reps   Assessment/Plan    PT Assessment Patient needs continued PT services  PT Problem List Decreased strength;Decreased activity tolerance;Decreased balance;Decreased mobility       PT Treatment Interventions DME instruction;Gait training;Functional mobility training;Balance training;Therapeutic exercise;Therapeutic activities;Patient/family education    PT Goals (Current goals can be found in the Care Plan  section)  Acute Rehab PT Goals PT Goal Formulation: Patient unable to participate in goal setting Time For Goal Achievement: 09/05/17 Potential to Achieve Goals: Good    Frequency Min 2X/week   Barriers to discharge        Co-evaluation               AM-PAC PT "6 Clicks" Daily Activity  Outcome Measure Difficulty turning over in bed (including adjusting bedclothes, sheets and blankets)?: Unable Difficulty moving from lying on back to sitting on the side of the bed? : Unable Difficulty sitting down on and standing up from a chair with arms (e.g., wheelchair, bedside commode, etc,.)?: Unable Help needed moving to and from a bed to chair (including a wheelchair)?: A Lot Help needed walking in hospital room?: Total Help needed climbing 3-5 steps with a railing? : Total 6 Click Score: 7    End of Session Equipment Utilized During Treatment: Gait belt;Oxygen Activity Tolerance: Patient tolerated treatment well Patient left: in bed;with call bell/phone within reach;with bed alarm set Nurse Communication: Mobility status PT Visit Diagnosis: Difficulty in walking, not elsewhere classified (R26.2);Muscle weakness (generalized) (M62.81)    Time: 1610-9604 PT Time Calculation (min) (ACUTE ONLY): 32 min  Charges:   PT Evaluation $PT Eval Low Complexity: 1 Low PT Treatments $Therapeutic Exercise: 8-22 mins   PT G Codes:        DElly Modena PT, DPT 08/23/17, 10:56 AM

## 2017-08-23 NOTE — Plan of Care (Signed)
Pt transferred to 1C prior to shift change. VS stable. Pt on 1L O2 with O2 sats in the high 90's. A&Ox3, disoriented to time. Follow commands. Denies pain.

## 2017-08-23 NOTE — Progress Notes (Signed)
[facility] [place], [place] 08/23/17  Subjective:  This morning, patient appears to be a little more alert and interactive However, during examination patient was asked to demonstrate arm movement which resulted in back pain and she started to cry and moan again.  She is able to follow simple commands Hemodialysis was uneventful yesterday  Objective:  Vital signs in last 24 hours:  Temp:  [98.2 F (36.8 C)-99.6 F (37.6 C)] 98.2 F (36.8 C) (05/25 1200) Pulse Rate:  [87-101] 100 (05/25 1300) Resp:  [13-24] 21 (05/25 1300) BP: (107-196)/(55-132) 107/82 (05/25 1300) SpO2:  [93 %-100 %] 93 % (05/25 1300) Weight:  [106 kg (233 lb 11 oz)-108.7 kg (239 lb 10.2 oz)] 106 kg (233 lb 11 oz) (05/25 0500)  Weight change: 4.303 kg (9 lb 7.8 oz) Filed Weights   08/22/17 1342 08/22/17 1750 08/23/17 0500  Weight: 108.6 kg (239 lb 7.8 oz) 108.7 kg (239 lb 10.2 oz) 106 kg (233 lb 11 oz)    Intake/Output:    Intake/Output Summary (Last 24 hours) at 08/23/2017 1515 Last data filed at 08/23/2017 1325 Gross per 24 hour  Intake 220 ml  Output 114 ml  Net 106 ml     Physical Exam: General: Chronically ill appearing   HEENT Ancteric, dry oral mucus membranes  Neck supple  Pulm/lungs Clear , [place] O2  CVS/Heart Regular rhythm  Abdomen:  Soft, NT  Extremities: Rt BKA  Neurologic: Moaning, able to follow few commands, delirious  Skin: No acute rashes  Access: Rt Upper arm AVF       Basic Metabolic Panel:  Recent Labs  Lab 08/21/17 2236 08/22/17 0633 08/22/17 1413 08/23/17 0522  NA 131* 129*  --  136  K 6.2* 5.9*  --  4.3  CL 93* 93*  --  96*  CO2 26 26  --  28  GLUCOSE 90 99  --  100*  BUN 37* 40*  --  19  CREATININE 6.39* 6.83*  --  4.46*  CALCIUM 9.1 8.6*  --  8.5*  MG  --  2.3  --  1.8  PHOS  --  5.9* 5.6* 4.7*     CBC: Recent Labs  Lab 08/21/17 2236 08/22/17 0633  WBC 6.0 4.8  HGB 12.4 11.6*  HCT 38.4 34.8*  MCV 88.5 87.5  PLT 260 200       Lab Results  Component Value Date   HEPBSAG Negative 06/01/2016      Microbiology:  Recent Results (from the past 240 hour(s))  MRSA PCR Screening     Status: None   Collection Time: 08/22/17  2:41 AM  Result Value Ref Range Status   MRSA by PCR NEGATIVE NEGATIVE Final    Comment:        The GeneXpert MRSA Assay (FDA approved for NASAL specimens only), is one component of a comprehensive MRSA colonization surveillance program. It is not intended to diagnose MRSA infection nor to guide or monitor treatment for MRSA infections. Performed at [facility], [address] [place], [place] [address]   Culture, blood (routine x 2)     Status: None (Preliminary result)   Collection Time: 08/22/17  3:01 PM  Result Value Ref Range Status   Specimen Description BLOOD HEMODIALYSIS FISTULA  Final   Special Requests   Final    BOTTLES DRAWN AEROBIC AND ANAEROBIC Blood Culture results may not be optimal due to an excessive volume of blood received in culture bottles   Culture  Final    NO GROWTH < 24 HOURS Performed at [facility], [address] [place], [place] [address]    Report Status PENDING  Incomplete  Culture, blood (routine x 2)     Status: None (Preliminary result)   Collection Time: 08/22/17  4:11 PM  Result Value Ref Range Status   Specimen Description BLOOD HEMODIALYSIS FISTULA  Final   Special Requests   Final    BOTTLES DRAWN AEROBIC AND ANAEROBIC Blood Culture results may not be optimal due to an excessive volume of blood received in culture bottles   Culture   Final    NO GROWTH < 24 HOURS Performed at [facility], [address] [place], [place] [address]    Report Status PENDING  Incomplete    Coagulation Studies: No results for input(s): LABPROT, INR in the last 72 hours.  Urinalysis: Recent Labs    08/21/17 1300 08/22/17 1130  COLORURINE YELLOW* YELLOW*  LABSPEC 1.015 1.015  PHURINE 6.0 6.0  GLUCOSEU 50*  NEGATIVE  HGBUR NEGATIVE NEGATIVE  BILIRUBINUR NEGATIVE NEGATIVE  KETONESUR NEGATIVE NEGATIVE  PROTEINUR >=300* 100*  NITRITE NEGATIVE NEGATIVE  LEUKOCYTESUR NEGATIVE NEGATIVE      Imaging: Dg Chest 1 View  Result Date: 08/21/2017 CLINICAL DATA:  Hypoxia EXAM: CHEST  1 VIEW COMPARISON:  Chest radiograph 07/01/2017 FINDINGS: The image is degraded by body habitus. There is massive cardiomegaly with poor visualization of the costophrenic angles. No focal airspace consolidation or overt pulmonary edema. IMPRESSION: Massive cardiomegaly without focal airspace consolidation. Electronically Signed   By: Deatra Robinson M.D.   On: 08/21/2017 22:30   Ct Head Wo Contrast  Result Date: 08/21/2017 CLINICAL DATA:  Altered level of consciousness. EXAM: CT HEAD WITHOUT CONTRAST TECHNIQUE: Contiguous axial images were obtained from the base of the skull through the vertex without intravenous contrast. COMPARISON:  Head CT and brain MRI 05/31/2016 FINDINGS: Brain: Brain volume is normal for age. No intracranial hemorrhage, mass effect, or midline shift. No hydrocephalus. The basilar cisterns are patent. No evidence of territorial infarct or acute ischemia. No extra-axial or intracranial fluid collection. Vascular: Atherosclerosis of skullbase vasculature without hyperdense vessel or abnormal calcification. Skull: No fracture or focal lesion. Sinuses/Orbits: Paranasal sinuses and mastoid air cells are clear. The visualized orbits are unremarkable. Bilateral cataract extraction. Other: None. IMPRESSION: Negative noncontrast head CT. Electronically Signed   By: Rubye Oaks M.D.   On: 08/21/2017 22:24   Mr Brain Wo Contrast  Result Date: 08/22/2017 CLINICAL DATA:  Confusion. Altered mental status. End-stage renal disease. EXAM: MRI HEAD WITHOUT CONTRAST TECHNIQUE: Multiplanar, multiecho pulse sequences of the brain and surrounding structures were obtained without intravenous contrast. COMPARISON:  Head CT  08/21/2017 Brain MRI 05/31/2016 FINDINGS: BRAIN: The midline structures are normal. There is no acute infarct or acute hemorrhage. There is no mass lesion or other mass effect. There is no hydrocephalus, dural abnormality or extra-axial collection. Mild periventricular white matter hyperintensity. Atrophy is greater than expected for age. Single focus of chronic microhemorrhage in the left basal ganglia. VASCULAR: Major intracranial arterial and venous sinus flow voids are preserved. SKULL AND UPPER CERVICAL SPINE: The visualized skull base, calvarium, upper cervical spine and extracranial soft tissues are normal. SINUSES/ORBITS: No fluid levels or advanced mucosal thickening. No mastoid or middle ear effusion. The orbits are normal. IMPRESSION: 1. No acute abnormality. 2. Atrophy greater than expected for age. Minimal small vessel disease. Electronically Signed   By: Deatra Robinson M.D.   On: 08/22/2017 22:13  Mr Thoracic Spine Wo Contrast  Result Date: 08/23/2017 CLINICAL DATA:  Low-grade fever. Mid back pain. Altered mental status. End-stage renal disease, evaluate for discitis or spinal infection. EXAM: MRI THORACIC SPINE WITHOUT CONTRAST TECHNIQUE: Multiplanar, multisequence MR imaging of the thoracic spine was performed. No intravenous contrast was administered. COMPARISON:  None. FINDINGS: Alignment:  Physiologic. Vertebrae: No fracture, evidence of discitis, or bone lesion. Cord:  Normal signal and morphology. Paraspinal and other soft tissues: BILATERAL pleural effusions. Tubular 3 cm density layering posteriorly within the pleural space on the LEFT, at the T9-10 level, uncertain significance. Cardiomegaly. Disc levels: No disc protrusion or spinal stenosis. Midthoracic degenerative change with Schmorl's nodes. IMPRESSION: Unremarkable thoracic spine MRI. No thoracic spine vertebral body abnormality, significant disc protrusion, or evidence of spinal infection. BILATERAL pleural effusions, with unusual  tubular 3 cm density layering posteriorly on the LEFT. Consider CT chest with contrast for further evaluation. Electronically Signed   By: Elsie Stain M.D.   On: 08/23/2017 14:22     Medications:   . levETIRAcetam 1,500 mg (08/22/17 1830)   . aspirin  81 mg Oral Daily  . atorvastatin  40 mg Oral q1800  . clopidogrel  75 mg Oral Q1200  . docusate sodium  100 mg Oral BID  . escitalopram  2.5 mg Oral Daily  . fluticasone  1 spray Each Nare Daily  . gabapentin  300 mg Oral Daily  . heparin  5,000 Units Subcutaneous Q8H  . insulin aspart  0-9 Units Subcutaneous Q4H  . lactulose  10 g Oral q1800  . lidocaine  1 patch Transdermal QHS  . mouth rinse  15 mL Mouth Rinse BID  . Melatonin  5 mg Oral QHS  . metoprolol tartrate  50 mg Oral BID  . multivitamin with minerals  1 tablet Oral Daily  . OXcarbazepine  150 mg Oral QHS  . pantoprazole  40 mg Oral Daily  . polyethylene glycol  17 g Oral Q1200  . polyvinyl alcohol  1 drop Both Eyes TID  . sevelamer carbonate  800 mg Oral TID AC & HS   acetaminophen **OR** acetaminophen, benztropine, bisacodyl, bisacodyl, clonazePAM **AND** [DISCONTINUED] clonazePAM, hydrALAZINE, lip balm, nitroGLYCERIN, ondansetron **OR** ondansetron (ZOFRAN) IV, traZODone  Assessment/ Plan:  52 y.o. caucaisan female  female with end stage renal disease on hemodialysis, COPD/asthma, diabetes mellitus type II, congestive heart failure, coronary artery disease. 5/24- admitted for altered mental status   [facility]/ [address]/ MWF 107kg Right AVF  1. End Stage Renal Disease with hyperkalemia -Next hemodialysis is planned for Monday  2. AOCKD - Hgb 11.6 - monitor  3. SHPTH - Monitor Phos Currently 4.7, acceptable  4. Altered mental status - neurology evaluation is ongoing Brain MRI, thoracic MRI    LOS: 1 Kimberly Montgomery 5/25/20193:15 PM  [facility] [place], [place] [phone]  Note: This note was prepared with  Dragon dictation. Any transcription errors are unintentional

## 2017-08-23 NOTE — Consult Note (Signed)
Much more awake and following commands.     Past Medical History:  Diagnosis Date  . Anginal pain (HCC)   . Anxiety   . Bipolar disorder (HCC)   . CAD (coronary artery disease)   . CHF (congestive heart failure) (HCC)   . Chronic lower back pain   . Depression   . Endometriosis   . ESRD (end stage renal disease) on dialysis (HCC)    "[facility]; [address]; [place]; TTS" (01/19/2016)  . Gastroparesis   . GERD (gastroesophageal reflux disease)   . History of blood transfusion "several"   "my blood would get low; low RBC"  . History of hiatal hernia   . HLD (hyperlipidemia)   . Hypertension   . Migraine    "monthly" (01/19/2016)  . Myocardial infarction (HCC) 2017   "~ 3 wks ago" (01/19/2016)  . Renal disorder   . Renal insufficiency   . Seizures (HCC) 07/2015   "I've only had the 1; don't know what from" (01/19/2016)  . Stroke (HCC)   . Type II diabetes mellitus (HCC)     Past Surgical History:  Procedure Laterality Date  . ABDOMINAL HYSTERECTOMY     "partial"  . AV FISTULA PLACEMENT Right 08/23/2016   Procedure: ARTERIOVENOUS (AV) FISTULA CREATION  ( BRACHIAL CEPHALIC );  Surgeon: Renford Dills, MD;  Location: [facility] ORS;  Service: Vascular;  Laterality: Right;  . BELOW KNEE LEG AMPUTATION Right 2010?  [name] CARDIAC CATHETERIZATION Right 07/10/2015   Procedure: Left Heart Cath and Coronary Angiography;  Surgeon: Laurier Nancy, MD;  Location: [facility]IVE CV [facility];  Service: Cardiovascular;  Laterality: Right;  . CARDIAC CATHETERIZATION Right 09/14/2015   Procedure: Left Heart Cath and Coronary Angiography;  Surgeon: Laurier Nancy, MD;  Location: [facility];  Service: Cardiovascular;  Laterality: Right;  . CARDIAC CATHETERIZATION N/A 09/14/2015   Procedure: Coronary Stent Intervention;  Surgeon: Alwyn Pea, MD;  Location: [facility];  Service: Cardiovascular;  Laterality: N/A;  . CARDIAC CATHETERIZATION Right 11/20/2015   Procedure: Left Heart Cath  and Coronary Angiography;  Surgeon: Laurier Nancy, MD;  Location: [facility];  Service: Cardiovascular;  Laterality: Right;  . CATARACT EXTRACTION, BILATERAL    . CORONARY ANGIOPLASTY WITH STENT PLACEMENT  <2017   @ [facility]/notes 07/03/2013  . CORONARY ANGIOPLASTY WITH STENT PLACEMENT    . DIALYSIS/PERMA CATHETER REMOVAL N/A 11/12/2016   Procedure: DIALYSIS/PERMA CATHETER REMOVAL;  Surgeon: Renford Dills, MD;  Location: [facility];  Service: Cardiovascular;  Laterality: N/A;  . HYSTEROTOMY    . PERIPHERAL VASCULAR CATHETERIZATION N/A 08/30/2015   Procedure: Dialysis/Perma Catheter Insertion;  Surgeon: Annice Needy, MD;  Location: [facility];  Service: Cardiovascular;  Laterality: N/A;  . PERIPHERAL VASCULAR CATHETERIZATION N/A 01/01/2016   Procedure: Dialysis/Perma Catheter Insertion;  Surgeon: Annice Needy, MD;  Location: [facility];  Service: Cardiovascular;  Laterality: N/A;  . PERITONEAL CATHETER INSERTION  11/03/2013   [name] 11/03/2013  . PERITONEAL CATHETER REMOVAL  01/01/2016   "took the one from May out; put new PD cath in" (01/19/2016)  . PERITONEAL CATHETER REMOVAL  11/03/2013; 02/11/2014   [name] 11/03/2013; Removal of tunneled catheter/notes 02/11/2014  . SALPINGOOPHORECTOMY Right    [name] 07/03/2013  . TEE WITHOUT CARDIOVERSION N/A 06/07/2016   Procedure: TRANSESOPHAGEAL ECHOCARDIOGRAM (TEE);  Surgeon: Chrystie Nose, MD;  Location: [facility];  Service: Cardiovascular;  Laterality: N/A;  . TUBAL LIGATION  Family History  Problem Relation Age of Onset  . CAD Unknown   . Diabetes Unknown   . Bipolar disorder Unknown   . Cervical cancer Mother   . Other Father        heart bypass  . Breast cancer Neg Hx     Social History:  reports that she quit smoking about 2 years ago. Her smoking use included cigarettes. She smoked 0.75 packs per day. She has never used smokeless tobacco. She reports that she does not drink alcohol or use  drugs.  Allergies  Allergen Reactions  . Cephalosporins Anaphylaxis    Patient has tolerated meropenem.   [name] Penicillins Anaphylaxis and Other (See Comments)    Has patient had a PCN reaction causing immediate rash, facial/tongue/throat swelling, SOB or lightheadedness with hypotension: Yes Has patient had a PCN reaction causing severe rash involving mucus membranes or skin necrosis: No Has patient had a PCN reaction that required hospitalization No Has patient had a PCN reaction occurring within the last 10 years: No If all of the above answers are "NO", then may proceed with Cephalosporin use.  . Reglan [Metoclopramide] Other (See Comments)    Severe EPS (lip, head, body tremors) March 2018  . Risperdal [Risperidone] Other (See Comments)    Severe EPS (lip, head, body tremors) March 2018  . Lamictal [Lamotrigine] Other (See Comments)    Reaction:  Hallucinations  . Phenergan [Promethazine Hcl] Nausea And Vomiting    Sensitivity to medicine  . Pravastatin Other (See Comments)    Reaction:  Muscle pain   . Sulfa Antibiotics Other (See Comments)    Reaction:  Unknown     Medications: I have reviewed the patient's current medications.   Physical Examination: Blood pressure (!) 141/56, pulse 99, temperature 98.2 F (36.8 C), temperature source Oral, resp. rate 19, height 5\' 5"  (1.651 m), weight 233 lb 11 oz (106 kg), last menstrual period 03/16/2015, SpO2 94 %.   Neurological Examination  Mental Status: Confused, agitation  Cranial Nerves: II: Discs flat bilaterally; Visual fields grossly normal, pupils equal, round, reactive to light and accommodation III,IV, VI: ptosis not present, extra-ocular motions intact bilaterally V,VII: smile symmetric, facial light touch sensation normal bilaterally VIII: hearing normal bilaterally IX,X: gag reflex present XI: bilateral shoulder shrug XII: midline tongue extension Motor: Right : Upper extremity   4/5    Left:     Upper extremity    4/5  BKA Lower extremity         4/5 Tone and bulk:normal tone throughout; no atrophy noted Sensory: Pinprick and light touch intact throughout, bilaterally Deep Tendon Reflexes: 1+ and symmetric throughout Plantars: Right: downgoing   Left: downgoing Cerebellar: normal finger-to-nose, normal rapid alternating movements and normal heel-to-shin test Gait: not tested      Laboratory Studies:   Basic Metabolic Panel: Recent Labs  Lab 08/21/17 2236 08/22/17 0633 08/22/17 1413 08/23/17 0522  NA 131* 129*  --  136  K 6.2* 5.9*  --  4.3  CL 93* 93*  --  96*  CO2 26 26  --  28  GLUCOSE 90 99  --  100*  BUN 37* 40*  --  19  CREATININE 6.39* 6.83*  --  4.46*  CALCIUM 9.1 8.6*  --  8.5*  MG  --  2.3  --  1.8  PHOS  --  5.9* 5.6* 4.7*    Liver Function Tests: Recent Labs  Lab 08/21/17 2236 08/22/17 0633 08/23/17 0522  AST 15  --   --  ALT 9*  --   --   ALKPHOS 151*  --   --   BILITOT 0.5  --   --   PROT 7.5  --   --   ALBUMIN 3.7 3.3* 3.3*   No results for input(s): LIPASE, AMYLASE in the last 168 hours. Recent Labs  Lab 08/21/17 2201 08/22/17 0633  AMMONIA 35 26    CBC: Recent Labs  Lab 08/21/17 2236 08/22/17 0633  WBC 6.0 4.8  HGB 12.4 11.6*  HCT 38.4 34.8*  MCV 88.5 87.5  PLT 260 200    Cardiac Enzymes: Recent Labs  Lab 08/21/17 2236  TROPONINI <0.03    BNP: Invalid input(s): POCBNP  CBG: Recent Labs  Lab 08/22/17 2002 08/22/17 2326 08/23/17 0327 08/23/17 0721 08/23/17 1227  GLUCAP 84 104* 100* 88 101*    Microbiology: Results for orders placed or performed during the hospital encounter of 08/21/17  MRSA PCR Screening     Status: None   Collection Time: 08/22/17  2:41 AM  Result Value Ref Range Status   MRSA by PCR NEGATIVE NEGATIVE Final    Comment:        The GeneXpert MRSA Assay (FDA approved for NASAL specimens only), is one component of a comprehensive MRSA colonization surveillance program. It is not intended to  diagnose MRSA infection nor to guide or monitor treatment for MRSA infections. Performed at [facility], [address] [place], [place] [address]   Culture, blood (routine x 2)     Status: None (Preliminary result)   Collection Time: 08/22/17  3:01 PM  Result Value Ref Range Status   Specimen Description BLOOD HEMODIALYSIS FISTULA  Final   Special Requests   Final    BOTTLES DRAWN AEROBIC AND ANAEROBIC Blood Culture results may not be optimal due to an excessive volume of blood received in culture bottles   Culture   Final    NO GROWTH < 24 HOURS Performed at [facility], [address], [place], [place] [address]    Report Status PENDING  Incomplete  Culture, blood (routine x 2)     Status: None (Preliminary result)   Collection Time: 08/22/17  4:11 PM  Result Value Ref Range Status   Specimen Description BLOOD HEMODIALYSIS FISTULA  Final   Special Requests   Final    BOTTLES DRAWN AEROBIC AND ANAEROBIC Blood Culture results may not be optimal due to an excessive volume of blood received in culture bottles   Culture   Final    NO GROWTH < 24 HOURS Performed at [facility], [address] [place], [place] [address]    Report Status PENDING  Incomplete    Coagulation Studies: No results for input(s): LABPROT, INR in the last 72 hours.  Urinalysis:  Recent Labs  Lab 08/21/17 1300 08/22/17 1130  COLORURINE YELLOW* YELLOW*  LABSPEC 1.015 1.015  PHURINE 6.0 6.0  GLUCOSEU 50* NEGATIVE  HGBUR NEGATIVE NEGATIVE  BILIRUBINUR NEGATIVE NEGATIVE  KETONESUR NEGATIVE NEGATIVE  PROTEINUR >=300* 100*  NITRITE NEGATIVE NEGATIVE  LEUKOCYTESUR NEGATIVE NEGATIVE    Lipid Panel:     Component Value Date/Time   CHOL 215 (H) 11/19/2015 0617   TRIG 186 (H) 11/19/2015 0617   HDL 63 11/19/2015 0617   CHOLHDL 3.4 11/19/2015 0617   VLDL 37 11/19/2015 0617   LDLCALC 115 (H) 11/19/2015 0617    HgbA1C:  Lab Results  Component Value Date   HGBA1C  5.7 (H) 05/15/2017    Urine Drug  Screen:      Component Value Date/Time   LABOPIA POSITIVE (A) 08/21/2017 1300   COCAINSCRNUR NONE DETECTED 08/21/2017 1300   LABBENZ NONE DETECTED 08/21/2017 1300   AMPHETMU NONE DETECTED 08/21/2017 1300   THCU NONE DETECTED 08/21/2017 1300   LABBARB NONE DETECTED 08/21/2017 1300    Alcohol Level: No results for input(s): ETH in the last 168 hours.  Other results: EKG: normal EKG, normal sinus rhythm, unchanged from previous tracings.  Imaging: Dg Chest 1 View  Result Date: 08/21/2017 CLINICAL DATA:  Hypoxia EXAM: CHEST  1 VIEW COMPARISON:  Chest radiograph 07/01/2017 FINDINGS: The image is degraded by body habitus. There is massive cardiomegaly with poor visualization of the costophrenic angles. No focal airspace consolidation or overt pulmonary edema. IMPRESSION: Massive cardiomegaly without focal airspace consolidation. Electronically Signed   By: Deatra Robinson M.D.   On: 08/21/2017 22:30   Ct Head Wo Contrast  Result Date: 08/21/2017 CLINICAL DATA:  Altered level of consciousness. EXAM: CT HEAD WITHOUT CONTRAST TECHNIQUE: Contiguous axial images were obtained from the base of the skull through the vertex without intravenous contrast. COMPARISON:  Head CT and brain MRI 05/31/2016 FINDINGS: Brain: Brain volume is normal for age. No intracranial hemorrhage, mass effect, or midline shift. No hydrocephalus. The basilar cisterns are patent. No evidence of territorial infarct or acute ischemia. No extra-axial or intracranial fluid collection. Vascular: Atherosclerosis of skullbase vasculature without hyperdense vessel or abnormal calcification. Skull: No fracture or focal lesion. Sinuses/Orbits: Paranasal sinuses and mastoid air cells are clear. The visualized orbits are unremarkable. Bilateral cataract extraction. Other: None. IMPRESSION: Negative noncontrast head CT. Electronically Signed   By: Rubye Oaks M.D.   On: 08/21/2017 22:24   Mr Brain Wo  Contrast  Result Date: 08/22/2017 CLINICAL DATA:  Confusion. Altered mental status. End-stage renal disease. EXAM: MRI HEAD WITHOUT CONTRAST TECHNIQUE: Multiplanar, multiecho pulse sequences of the brain and surrounding structures were obtained without intravenous contrast. COMPARISON:  Head CT 08/21/2017 Brain MRI 05/31/2016 FINDINGS: BRAIN: The midline structures are normal. There is no acute infarct or acute hemorrhage. There is no mass lesion or other mass effect. There is no hydrocephalus, dural abnormality or extra-axial collection. Mild periventricular white matter hyperintensity. Atrophy is greater than expected for age. Single focus of chronic microhemorrhage in the left basal ganglia. VASCULAR: Major intracranial arterial and venous sinus flow voids are preserved. SKULL AND UPPER CERVICAL SPINE: The visualized skull base, calvarium, upper cervical spine and extracranial soft tissues are normal. SINUSES/ORBITS: No fluid levels or advanced mucosal thickening. No mastoid or middle ear effusion. The orbits are normal. IMPRESSION: 1. No acute abnormality. 2. Atrophy greater than expected for age. Minimal small vessel disease. Electronically Signed   By: Deatra Robinson M.D.   On: 08/22/2017 22:13     Assessment/Plan:  52 y.o. female NH resident, with a known history of end-stage renal disease on hemodialysis, CAD, CHF, diabetes, bipolar disorder and other comorbidity presented with confusion/agitation and being altered.   Pt was found to have hyperkelemia, and creat at 6.2. No acute abnormality on [facility]   - Much more awake and follows commands. Till confused on location, date, time.  - MRI done no acute abnormalities  - Still think this is metabolic encephalopathy  08/23/2017, 12:44 PM

## 2017-08-23 NOTE — Progress Notes (Signed)
Patient A&Ox4, VSS, no complaints of pain.  Speech therapy advanced diet this morning and pt tolerating meals well with no signs of aspiration.  Pills whole in applesauce.  Patient worked with PT and stood at bedside today. At baseline, pt states she walks with a prosthesis, but has a wheelchair and a walker.    Pt to transfer to room 113.  Report called to [facility], Charity fundraiser.  Pt's daughter, [name], called and updated regarding pt's transfer.

## 2017-08-23 NOTE — Progress Notes (Signed)
Patient ID: Kimberly Montgomery, female   DOB: 1965/10/24, 52 y.o.   MRN: [id number]  [employer] [facility] PROGRESS NOTE  Kimberly Montgomery:[id number] DOB: 10/20/1965 DOA: 08/21/2017 PCP: Dorothey Baseman, MD  HPI/Subjective: Patient able to talk to me a little bit better today but still confused.  When she lifted up her arm she was yelling in pain.  Patient complains of back pain  Objective: Vitals:   08/23/17 1230 08/23/17 1300  BP: (!) 141/56 107/82  Pulse: 99 100  Resp: 19 (!) 21  Temp:    SpO2: 94% 93%    Filed Weights   08/22/17 1342 08/22/17 1750 08/23/17 0500  Weight: 108.6 kg (239 lb 7.8 oz) 108.7 kg (239 lb 10.2 oz) 106 kg (233 lb 11 oz)    ROS: Review of Systems  Unable to perform ROS: Acuity of condition   Exam: Physical Exam  Constitutional: She appears lethargic.  HENT:  Nose: No mucosal edema.  Mouth/Throat: No oropharyngeal exudate or posterior oropharyngeal edema.  Eyes: Pupils are equal, round, and reactive to light. Conjunctivae and lids are normal.  Neck: Carotid bruit is not present.  Cardiovascular: Regular rhythm, S1 normal, S2 normal and normal heart sounds.  Pulses:      Dorsalis pedis pulses are 2+ on the left side.  Respiratory: No accessory muscle usage. She has no decreased breath sounds. She has no wheezes. She has no rhonchi. She has no rales.  GI: Soft. Bowel sounds are normal. There is no tenderness.  Musculoskeletal:       Right knee: She exhibits no swelling.       Left ankle: She exhibits swelling.  Lymphadenopathy:    She has no cervical adenopathy.  Neurological: She appears lethargic.  Able to squeeze both my hands and wiggle her toes.  Tries to talk but is very mumbled.  Skin: Skin is warm. No rash noted. Nails show no clubbing.  Psychiatric:  Difficult to assess with her altered mental status.      Data Reviewed: Basic Metabolic Panel: Recent Labs  Lab 08/21/17 2236 08/22/17 0633 08/22/17 1413 08/23/17 0522  NA 131* 129*  --   136  K 6.2* 5.9*  --  4.3  CL 93* 93*  --  96*  CO2 26 26  --  28  GLUCOSE 90 99  --  100*  BUN 37* 40*  --  19  CREATININE 6.39* 6.83*  --  4.46*  CALCIUM 9.1 8.6*  --  8.5*  MG  --  2.3  --  1.8  PHOS  --  5.9* 5.6* 4.7*   Liver Function Tests: Recent Labs  Lab 08/21/17 2236 08/22/17 0633 08/23/17 0522  AST 15  --   --   ALT 9*  --   --   ALKPHOS 151*  --   --   BILITOT 0.5  --   --   PROT 7.5  --   --   ALBUMIN 3.7 3.3* 3.3*    Recent Labs  Lab 08/21/17 2201 08/22/17 0633  AMMONIA 35 26   CBC: Recent Labs  Lab 08/21/17 2236 08/22/17 0633  WBC 6.0 4.8  HGB 12.4 11.6*  HCT 38.4 34.8*  MCV 88.5 87.5  PLT 260 200   Cardiac Enzymes: Recent Labs  Lab 08/21/17 2236  TROPONINI <0.03   BNP (last 3 results) Recent Labs    08/21/17 2236  BNP 1,509.0*     CBG: Recent Labs  Lab 08/22/17 2002 08/22/17 2326 08/23/17 0327  08/23/17 0721 08/23/17 1227  GLUCAP 84 104* 100* 88 101*    Recent Results (from the past 240 hour(s))  MRSA PCR Screening     Status: None   Collection Time: 08/22/17  2:41 AM  Result Value Ref Range Status   MRSA by PCR NEGATIVE NEGATIVE Final    Comment:        The GeneXpert MRSA Assay (FDA approved for NASAL specimens only), is one component of a comprehensive MRSA colonization surveillance program. It is not intended to diagnose MRSA infection nor to guide or monitor treatment for MRSA infections. Performed at [facility], [address] [place], [place] [address]   Culture, blood (routine x 2)     Status: None (Preliminary result)   Collection Time: 08/22/17  3:01 PM  Result Value Ref Range Status   Specimen Description BLOOD HEMODIALYSIS FISTULA  Final   Special Requests   Final    BOTTLES DRAWN AEROBIC AND ANAEROBIC Blood Culture results may not be optimal due to an excessive volume of blood received in culture bottles   Culture   Final    NO GROWTH < 24 HOURS Performed at [facility], [address], [place], [place] [address]    Report Status PENDING  Incomplete  Culture, blood (routine x 2)     Status: None (Preliminary result)   Collection Time: 08/22/17  4:11 PM  Result Value Ref Range Status   Specimen Description BLOOD HEMODIALYSIS FISTULA  Final   Special Requests   Final    BOTTLES DRAWN AEROBIC AND ANAEROBIC Blood Culture results may not be optimal due to an excessive volume of blood received in culture bottles   Culture   Final    NO GROWTH < 24 HOURS Performed at [facility], [address] [place], [place] [address]    Report Status PENDING  Incomplete     Studies: Dg Chest 1 View  Result Date: 08/21/2017 CLINICAL DATA:  Hypoxia EXAM: CHEST  1 VIEW COMPARISON:  Chest radiograph 07/01/2017 FINDINGS: The image is degraded by body habitus. There is massive cardiomegaly with poor visualization of the costophrenic angles. No focal airspace consolidation or overt pulmonary edema. IMPRESSION: Massive cardiomegaly without focal airspace consolidation. Electronically Signed   By: Deatra Robinson M.D.   On: 08/21/2017 22:30   Ct Head Wo Contrast  Result Date: 08/21/2017 CLINICAL DATA:  Altered level of consciousness. EXAM: CT HEAD WITHOUT CONTRAST TECHNIQUE: Contiguous axial images were obtained from the base of the skull through the vertex without intravenous contrast. COMPARISON:  Head CT and brain MRI 05/31/2016 FINDINGS: Brain: Brain volume is normal for age. No intracranial hemorrhage, mass effect, or midline shift. No hydrocephalus. The basilar cisterns are patent. No evidence of territorial infarct or acute ischemia. No extra-axial or intracranial fluid collection. Vascular: Atherosclerosis of skullbase vasculature without hyperdense vessel or abnormal calcification. Skull: No fracture or focal lesion. Sinuses/Orbits: Paranasal sinuses and mastoid air cells are clear. The visualized orbits are unremarkable. Bilateral cataract extraction. Other: None.  IMPRESSION: Negative noncontrast head CT. Electronically Signed   By: Rubye Oaks M.D.   On: 08/21/2017 22:24   Mr Brain Wo Contrast  Result Date: 08/22/2017 CLINICAL DATA:  Confusion. Altered mental status. End-stage renal disease. EXAM: MRI HEAD WITHOUT CONTRAST TECHNIQUE: Multiplanar, multiecho pulse sequences of the brain and surrounding structures were obtained without intravenous contrast. COMPARISON:  Head CT 08/21/2017 Brain MRI 05/31/2016 FINDINGS: BRAIN: The midline structures are normal. There is no acute infarct  or acute hemorrhage. There is no mass lesion or other mass effect. There is no hydrocephalus, dural abnormality or extra-axial collection. Mild periventricular white matter hyperintensity. Atrophy is greater than expected for age. Single focus of chronic microhemorrhage in the left basal ganglia. VASCULAR: Major intracranial arterial and venous sinus flow voids are preserved. SKULL AND UPPER CERVICAL SPINE: The visualized skull base, calvarium, upper cervical spine and extracranial soft tissues are normal. SINUSES/ORBITS: No fluid levels or advanced mucosal thickening. No mastoid or middle ear effusion. The orbits are normal. IMPRESSION: 1. No acute abnormality. 2. Atrophy greater than expected for age. Minimal small vessel disease. Electronically Signed   By: Deatra Robinson M.D.   On: 08/22/2017 22:13   Mr Thoracic Spine Wo Contrast  Result Date: 08/23/2017 CLINICAL DATA:  Low-grade fever. Mid back pain. Altered mental status. End-stage renal disease, evaluate for discitis or spinal infection. EXAM: MRI THORACIC SPINE WITHOUT CONTRAST TECHNIQUE: Multiplanar, multisequence MR imaging of the thoracic spine was performed. No intravenous contrast was administered. COMPARISON:  None. FINDINGS: Alignment:  Physiologic. Vertebrae: No fracture, evidence of discitis, or bone lesion. Cord:  Normal signal and morphology. Paraspinal and other soft tissues: BILATERAL pleural effusions. Tubular 3  cm density layering posteriorly within the pleural space on the LEFT, at the T9-10 level, uncertain significance. Cardiomegaly. Disc levels: No disc protrusion or spinal stenosis. Midthoracic degenerative change with Schmorl's nodes. IMPRESSION: Unremarkable thoracic spine MRI. No thoracic spine vertebral body abnormality, significant disc protrusion, or evidence of spinal infection. BILATERAL pleural effusions, with unusual tubular 3 cm density layering posteriorly on the LEFT. Consider CT chest with contrast for further evaluation. Electronically Signed   By: Elsie Stain M.D.   On: 08/23/2017 14:22    Scheduled Meds: . aspirin  81 mg Oral Daily  . atorvastatin  40 mg Oral q1800  . clopidogrel  75 mg Oral Q1200  . docusate sodium  100 mg Oral BID  . escitalopram  2.5 mg Oral Daily  . fluticasone  1 spray Each Nare Daily  . gabapentin  300 mg Oral Daily  . heparin  5,000 Units Subcutaneous Q8H  . insulin aspart  0-9 Units Subcutaneous Q4H  . lactulose  10 g Oral q1800  . lidocaine  1 patch Transdermal QHS  . mouth rinse  15 mL Mouth Rinse BID  . Melatonin  5 mg Oral QHS  . metoprolol tartrate  50 mg Oral BID  . multivitamin with minerals  1 tablet Oral Daily  . OXcarbazepine  150 mg Oral QHS  . pantoprazole  40 mg Oral Daily  . polyethylene glycol  17 g Oral Q1200  . polyvinyl alcohol  1 drop Both Eyes TID  . sevelamer carbonate  800 mg Oral TID AC & HS   Continuous Infusions: . levETIRAcetam 1,500 mg (08/22/17 1830)    Assessment/Plan:  1. Acute metabolic encephalopathy.  Unclear etiology currently but could be secondary to medications.  MRI of the brain is negative.  Patient is on medications that could potentially cause altered mental status.  Continue to monitor closely.  Patient is improved from yesterday but not completely back to normal yet. 2. Hyperkalemia with end-stage renal disease.  Hemodialysis today as per nephrology 3. Type 2 diabetes mellitus on sliding scale  insulin 4. Acute hypoxic respiratory failure on nasal cannula oxygen supplementation.  Pulse ox 88% on room air on presentation.  Currently on 1 L of oxygen. 5. History of seizure on Keppra 6. Hyperlipidemia unspecified on atorvastatin 7. GERD  on Protonix 8. Obesity weight loss needed  Code Status:     Code Status Orders  (From admission, onward)        Start     Ordered   08/22/17 0212  Full code  Continuous     08/22/17 0211    Code Status History    Date Active Date Inactive Code Status Order ID Comments User Context   07/01/2017 1654 07/03/2017 0404 Full Code [id number]  Enedina Finner, MD Inpatient   05/15/2017 0540 05/21/2017 1406 Full Code [id number]  Ihor Austin, MD Inpatient   05/31/2016 1606 06/11/2016 2013 Full Code [id number]  Kathlene Cote, PA-C ED   03/13/2016 2236 03/16/2016 2033 Full Code [id number]  Oralia Manis, MD ED   02/25/2016 1758 02/27/2016 2332 Full Code [id number]  Shaune Pollack, MD Inpatient   01/19/2016 0454 01/20/2016 2103 Full Code [id number]  Hillary Bow, DO ED   12/25/2015 2001 01/04/2016 2024 Full Code [id number]  Houston Siren, MD Inpatient   11/19/2015 0117 11/21/2015 1956 Full Code [id number]  Hugel[place]yer, [place], DO Inpatient   10/06/2015 0208 10/06/2015 1832 Full Code [id number]  Ihor Austin, MD Inpatient   09/13/2015 0214 09/15/2015 1728 Full Code [id number]  Arnaldo Natal, MD Inpatient   08/24/2015 0350 08/31/2015 1847 Full Code [id number]  Ihor Austin, MD Inpatient   07/16/2015 0626 08/02/2015 1848 Full Code [id number]  Ihor Austin, MD ED   07/10/2015 0149 07/12/2015 1301 Full Code [id number]  Oralia Manis, MD Inpatient   05/12/2015 1546 05/16/2015 1735 Full Code [id number]  Auburn Bilberry, MD ED     Family Communication: Daughter yesterday Disposition Plan: To be determined  Consultants:  Critical care specialist  Neurology  Time spent: 27 minutes  Mathan Darroch [employer]

## 2017-08-23 NOTE — Progress Notes (Signed)
PULMONARY / CRITICAL CARE MEDICINE   Name: Kimberly Montgomery MRN: [id number] DOB: November 10, 1965    ADMISSION DATE:  08/21/2017   HISTORY OF PRESENT ILLNESS:   This is a 52 year old female with end-stage renal disease on hemodialysis who presented to the ED via EMS from [facility] with acute change in mental status.  History is obtained from ED records and from EMS records as patient is still confused and unable to provide a history.  Per nursing home staff report, patient was noted to be drowsy all day and at about 2040, symptoms got worse and blood work done at the facility showed hypokalemia with potassium level of 6.2.  Therefore EMS was called.  When EMS arrived, patient was hypoxic with SPO2 of 80% on room air.  Her blood pressure was 160/80.  She will open her eyes but was not responding verbally.  CT head in the ED was negative for any acute infarct or bleed Her chest x-ray showed cardiomegaly without any infiltrates.  Patient was hospitalized from 07/01/2017 to 07/02/2017 with hyperkalemia.  At the time, she had missed dialysis.  It is unclear if this time she did miss any dialysis sessions.  Her last echo was in March 2018 and showed an EF of 55 to 60% with normal systolic function    REVIEW OF SYSTEMS:   Thoracic back pain  SUBJECTIVE:  Patient reported that she feels much better.  VITAL SIGNS: BP (!) 146/56   Pulse 83   Temp 98.4 F (36.9 C) (Oral)   Resp 17   Ht 5\' 5"  (1.651 m)   Wt 233 lb 11 oz (106 kg)   LMP 03/16/2015 Comment: partial hysterectomy  SpO2 93%   BMI 38.89 kg/m   HEMODYNAMICS:  no compromise  OXYGEN:  1 liter [place]  INTAKE / OUTPUT: I/O last 3 completed shifts: In: 100 [IV Piggyback:100] Out: 119 [Urine:105; Other:14]  PHYSICAL EXAMINATION: General: No nourished, well-developed, in no acute distress Neuro: Somnolent, opens eyes to voice and touch, not following commands, withdraws both extremities to noxious stimulus, pupils are equal and reactive,  corneal reflexes intact HEENT: Head is normocephalic and atraumatic, PERRLA, trachea midline, no JVD Cardiovascular: Apical pulse regular, S1-S2, no murmur regurg or gallop, +2 pulses, no edema Lungs: Bilateral breath sounds, diminished in the bases, no wheezes or rhonchi, bibasilar crackles Abdomen: Nondistended, normal bowel sounds in all 4 quadrants, palpation reveals no organomegaly Musculoskeletal: 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  LABS:  BMET Recent Labs  Lab 08/21/17 2236 08/22/17 0633 08/23/17 0522  NA 131* 129* 136  K 6.2* 5.9* 4.3  CL 93* 93* 96*  CO2 26 26 28   BUN 37* 40* 19  CREATININE 6.39* 6.83* 4.46*  GLUCOSE 90 99 100*    Electrolytes Recent Labs  Lab 08/21/17 2236 08/22/17 0633 08/22/17 1413 08/23/17 0522  CALCIUM 9.1 8.6*  --  8.5*  MG  --  2.3  --  1.8  PHOS  --  5.9* 5.6* 4.7*    CBC Recent Labs  Lab 08/21/17 2236 08/22/17 0633  WBC 6.0 4.8  HGB 12.4 11.6*  HCT 38.4 34.8*  PLT 260 200    Coag's No results for input(s): APTT, INR in the last 168 hours.  Sepsis Markers No results for input(s): LATICACIDVEN, PROCALCITON, O2SATVEN in the last 168 hours.  ABG Recent Labs  Lab 08/22/17 0339  PHART 7.32*  PCO2ART 55*  PO2ART 133*    Liver Enzymes Recent  Labs  Lab 08/21/17 2236 08/22/17 0633 08/23/17 0522  AST 15  --   --   ALT 9*  --   --   ALKPHOS 151*  --   --   BILITOT 0.5  --   --   ALBUMIN 3.7 3.3* 3.3*    Cardiac Enzymes Recent Labs  Lab 08/21/17 2236  TROPONINI <0.03    Glucose Recent Labs  Lab 08/22/17 2002 08/22/17 2326 08/23/17 0327 08/23/17 0721 08/23/17 1227 08/23/17 1607  GLUCAP 84 104* 100* 88 101* 154*    Imaging Mr Brain Wo Contrast  Result Date: 08/22/2017 CLINICAL DATA:  Confusion. Altered mental status. End-stage renal disease. EXAM: MRI HEAD WITHOUT CONTRAST TECHNIQUE: Multiplanar, multiecho pulse sequences of the brain and surrounding structures were  obtained without intravenous contrast. COMPARISON:  Head CT 08/21/2017 Brain MRI 05/31/2016 FINDINGS: BRAIN: The midline structures are normal. There is no acute infarct or acute hemorrhage. There is no mass lesion or other mass effect. There is no hydrocephalus, dural abnormality or extra-axial collection. Mild periventricular white matter hyperintensity. Atrophy is greater than expected for age. Single focus of chronic microhemorrhage in the left basal ganglia. VASCULAR: Major intracranial arterial and venous sinus flow voids are preserved. SKULL AND UPPER CERVICAL SPINE: The visualized skull base, calvarium, upper cervical spine and extracranial soft tissues are normal. SINUSES/ORBITS: No fluid levels or advanced mucosal thickening. No mastoid or middle ear effusion. The orbits are normal. IMPRESSION: 1. No acute abnormality. 2. Atrophy greater than expected for age. Minimal small vessel disease. Electronically Signed   By: Deatra Robinson M.D.   On: 08/22/2017 22:13   Mr Thoracic Spine Wo Contrast  Result Date: 08/23/2017 CLINICAL DATA:  Low-grade fever. Mid back pain. Altered mental status. End-stage renal disease, evaluate for discitis or spinal infection. EXAM: MRI THORACIC SPINE WITHOUT CONTRAST TECHNIQUE: Multiplanar, multisequence MR imaging of the thoracic spine was performed. No intravenous contrast was administered. COMPARISON:  None. FINDINGS: Alignment:  Physiologic. Vertebrae: No fracture, evidence of discitis, or bone lesion. Cord:  Normal signal and morphology. Paraspinal and other soft tissues: BILATERAL pleural effusions. Tubular 3 cm density layering posteriorly within the pleural space on the LEFT, at the T9-10 level, uncertain significance. Cardiomegaly. Disc levels: No disc protrusion or spinal stenosis. Midthoracic degenerative change with Schmorl's nodes. IMPRESSION: Unremarkable thoracic spine MRI. No thoracic spine vertebral body abnormality, significant disc protrusion, or evidence of  spinal infection. BILATERAL pleural effusions, with unusual tubular 3 cm density layering posteriorly on the LEFT. Consider CT chest with contrast for further evaluation. Electronically Signed   By: Elsie Stain M.D.   On: 08/23/2017 14:22     STUDIES:  Ct scan head- see results MRI head and  thoracic spine- see results  CULTURES: MRSA PCR Negative  ANTIBIOTICS: none  SIGNIFICANT EVENTS: 5/23 admitted to ICU 5/24 HD  LINES/TUBES: Peripheral IVs  DISCUSSION: 52 year old female with end-stage renal disease on hemodialysis presenting with hyperkalemia, and a acute change in mental status of unclear etiology.  Her urinalysis, chest x-ray, CT head, and labs do not show any abnormalities that can account for her change in mental status. Mental status has progressively improved since HD.   ASSESSMENT / PLAN:  1. Altered Mental status has resolved. Most likely it was from metabolic encephalopathy as this has resolved post HD. CT head and MRI head negative. Plan: Continue current therapy, Out of bed with PT, advance diet per speech  2. End stage renal failure on dialysis Plan: HD  As per  Nephrologist  3. Hyperkalemia- resolved  4. Hx Seizure disorder  Plan: change anticonvulsive meds back to PO, follow up Keppra levels- still pending  5. Thoracic spinal pain- MRI negative Plan: needs CT scan of chest with iv contrast to evaluate 3 cm tubular finding on MRI.  Discussed with Dr. Renae Gloss who will schedule before next HD.  FAMILY  - Updates: daughter and patient have been updated  Disp: patient will be transferred out of ICU  Jackson Latino, MD Pulmonary and Critical Care Medicine [facility] Pager: [phone]  08/23/2017, 4:38 PM

## 2017-08-23 NOTE — Evaluation (Signed)
Clinical/Bedside Swallow Evaluation Patient Details  Name: Kimberly Montgomery MRN: [id number] Date of Birth: 1965-05-29  Today's Date: 08/23/2017 Time: SLP Start Time (ACUTE ONLY): 0945 SLP Stop Time (ACUTE ONLY): 1040 SLP Time Calculation (min) (ACUTE ONLY): 55 min  Past Medical History:  Past Medical History:  Diagnosis Date  . Anginal pain (HCC)   . Anxiety   . Bipolar disorder (HCC)   . CAD (coronary artery disease)   . CHF (congestive heart failure) (HCC)   . Chronic lower back pain   . Depression   . Endometriosis   . ESRD (end stage renal disease) on dialysis (HCC)    "[facility]; [address]; [place]; TTS" (01/19/2016)  . Gastroparesis   . GERD (gastroesophageal reflux disease)   . History of blood transfusion "several"   "my blood would get low; low RBC"  . History of hiatal hernia   . HLD (hyperlipidemia)   . Hypertension   . Migraine    "monthly" (01/19/2016)  . Myocardial infarction (HCC) 2017   "~ 3 wks ago" (01/19/2016)  . Renal disorder   . Renal insufficiency   . Seizures (HCC) 07/2015   "I've only had the 1; don't know what from" (01/19/2016)  . Stroke (HCC)   . Type II diabetes mellitus (HCC)    Past Surgical History:  Past Surgical History:  Procedure Laterality Date  . ABDOMINAL HYSTERECTOMY     "partial"  . AV FISTULA PLACEMENT Right 08/23/2016   Procedure: ARTERIOVENOUS (AV) FISTULA CREATION  ( BRACHIAL CEPHALIC );  Surgeon: Renford Dills, MD;  Location: [facility] ORS;  Service: Vascular;  Laterality: Right;  . BELOW KNEE LEG AMPUTATION Right 2010?  [name] CARDIAC CATHETERIZATION Right 07/10/2015   Procedure: Left Heart Cath and Coronary Angiography;  Surgeon: Laurier Nancy, MD;  Location: [facility] CV [facility];  Service: Cardiovascular;  Laterality: Right;  . CARDIAC CATHETERIZATION Right 09/14/2015   Procedure: Left Heart Cath and Coronary Angiography;  Surgeon: Laurier Nancy, MD;  Location: [facility];  Service: Cardiovascular;  Laterality:  Right;  . CARDIAC CATHETERIZATION N/A 09/14/2015   Procedure: Coronary Stent Intervention;  Surgeon: Alwyn Pea, MD;  Location: [facility];  Service: Cardiovascular;  Laterality: N/A;  . CARDIAC CATHETERIZATION Right 11/20/2015   Procedure: Left Heart Cath and Coronary Angiography;  Surgeon: Laurier Nancy, MD;  Location: [facility];  Service: Cardiovascular;  Laterality: Right;  . CATARACT EXTRACTION, BILATERAL    . CORONARY ANGIOPLASTY WITH STENT PLACEMENT  <2017   @ [facility]/notes 07/03/2013  . CORONARY ANGIOPLASTY WITH STENT PLACEMENT    . DIALYSIS/PERMA CATHETER REMOVAL N/A 11/12/2016   Procedure: DIALYSIS/PERMA CATHETER REMOVAL;  Surgeon: Renford Dills, MD;  Location: [facility];  Service: Cardiovascular;  Laterality: N/A;  . HYSTEROTOMY    . PERIPHERAL VASCULAR CATHETERIZATION N/A 08/30/2015   Procedure: Dialysis/Perma Catheter Insertion;  Surgeon: Annice Needy, MD;  Location: [facility];  Service: Cardiovascular;  Laterality: N/A;  . PERIPHERAL VASCULAR CATHETERIZATION N/A 01/01/2016   Procedure: Dialysis/Perma Catheter Insertion;  Surgeon: Annice Needy, MD;  Location: [facility];  Service: Cardiovascular;  Laterality: N/A;  . PERITONEAL CATHETER INSERTION  11/03/2013   [name] 11/03/2013  . PERITONEAL CATHETER REMOVAL  01/01/2016   "took the one from May out; put new PD cath in" (01/19/2016)  . PERITONEAL CATHETER REMOVAL  11/03/2013; 02/11/2014   [name] 11/03/2013; Removal of tunneled catheter/notes 02/11/2014  . SALPINGOOPHORECTOMY Right    /  notes 07/03/2013  . TEE WITHOUT CARDIOVERSION N/A 06/07/2016   Procedure: TRANSESOPHAGEAL ECHOCARDIOGRAM (TEE);  Surgeon: Chrystie Nose, MD;  Location: [facility];  Service: Cardiovascular;  Laterality: N/A;  . TUBAL LIGATION     HPI:  Pt is a 52 y.o. female, NH resident, with a known history of multiple medical issues including anxiety/depression, HTN, hiatal hernia, Bipolar Dis. seizure,  end-stage renal disease on hemodialysis, Stroke, CAD, CHF, DMII. Patient was transferred from [facility] due to acute change in mental status and lethargy.  Per nursing home staff report, patient was noted to be drowsy all day, gradually getting worse.  Blood test done at the facility showed hyperkalemia with potassium level is 6.2.  Her oxygen saturation was noted to be low in 80s, on room air. is negative for any acute infarct or bleed.  The chest x-ray shows cardiomegaly without any infiltrates.  Pt currently more alert and attentive to task but required moderate cues at times. NOted weak UEs bilateral.    Assessment / Plan / Recommendation Clinical Impression  Pt appears to present w/ adequate oropharyngeal phase swallow function; she appears at reduced risk for aspiration when following general aspiration precautions. Pt consumed trials of thin liquids via cup(does not use straws baseline per pt report), purees, and mech soft trials w/ no immediate, overt coughing noted; no decline in vocal quality or respiratory status during/post po's. O2 sats remained mid-90s during/post trials. Oral phase appeared [facility] for boluses given, however, pt did exhibit min increased oral phase time for mastication of the solids(general fatigue?). Given min extra time and moistening foods time, pt cleared appropriately. Encouraged pt to moisten foods well, chew carefully and take small bites - monitor tougher solid foods more difficult to chew. Pt helped to feed self and followed commands for aspiration precautions w/ few cues. No Family w/ pt; he does appear to have min Cognitive decline currently - unsure of her baseline at [facility](?). Recommend a dysphagia level 3 (mech soft for easier mastication) w/ thin liquids via CUP w/ assistance to hold w/ pt; general aspiration precautions. NSG supervision during meals; Pills in Puree if needed d/t difficulty swallowing w/ liquids. ST services will f/u w/ pt's toleration of diet if  needed while admitted. If any change/decline in Cognitive status from this acute admission/illness, recommend any assessment of status be done once returned to her baseline setting (NH) as any changes from her baseline status can be best determined then. NSG updated.  SLP Visit Diagnosis: Dysphagia, unspecified (R13.10)(min increased time possibly d/t fatigue/attention)    Aspiration Risk  (reduced w/ general precautions)    Diet Recommendation  Dysphagia level 3 (mech soft) w/ Thin liquids. General aspiration precautions; feeding support at meals - allow pt to help hold cup for drinking. Reduce distractions.   Medication Administration: Whole meds with puree(if any difficulty noted w/ thin liquids)    Other  Recommendations Recommended Consults: (Dietician f/u as needed) Oral Care Recommendations: Oral care BID;Staff/trained caregiver to provide oral care;Patient independent with oral care Other Recommendations: (n/a)   Follow up Recommendations None      Frequency and Duration (n/a)  (n/a)       Prognosis Prognosis for Safe Diet Advancement: Good Barriers to Reach Goals: Cognitive deficits(Bipolar dis.)      Swallow Study   General Date of Onset: 08/21/17 HPI: Pt is a 52 y.o. female, NH resident, with a known history of multiple medical issues including anxiety/depression, HTN, hiatal hernia, Bipolar Dis. seizure, end-stage renal  disease on hemodialysis, Stroke, CAD, CHF, DMII. Patient was transferred from [facility] due to acute change in mental status and lethargy.  Per nursing home staff report, patient was noted to be drowsy all day, gradually getting worse.  Blood test done at the facility showed hyperkalemia with potassium level is 6.2.  Her oxygen saturation was noted to be low in 80s, on room air. is negative for any acute infarct or bleed.  The chest x-ray shows cardiomegaly without any infiltrates.  Pt currently more alert and attentive to task but required moderate cues  at times. NOted weak UEs bilateral.  Type of Study: Bedside Swallow Evaluation Previous Swallow Assessment: none reported Diet Prior to this Study: NPO(regular diet prior per report) Temperature Spikes Noted: No(wbc 4.8) Respiratory Status: Nasal cannula(2-3 liters) History of Recent Intubation: No Behavior/Cognition: Alert;Cooperative;Pleasant mood;Confused;Distractible;Requires cueing Oral Cavity Assessment: Within Functional Limits;Dry(min) Oral Care Completed by SLP: Recent completion by staff Oral Cavity - Dentition: Adequate natural dentition Vision: Functional for self-feeding Self-Feeding Abilities: Able to feed self;Needs assist;Needs set up(weak UEs bilateral) Patient Positioning: Upright in bed Baseline Vocal Quality: Normal;Low vocal intensity(min) Volitional Cough: Strong Volitional Swallow: Able to elicit    Oral/Motor/Sensory Function Overall Oral Motor/Sensory Function: Within functional limits   Ice Chips Ice chips: Within functional limits Presentation: Spoon(fed; 3 trials)   Thin Liquid Thin Liquid: Within functional limits Presentation: Cup;Self Fed(assisted holding; 10 trials)    Nectar Thick Nectar Thick Liquid: Not tested   Honey Thick Honey Thick Liquid: Not tested   Puree Puree: Within functional limits Presentation: Spoon(fed; 8 trials)   Solid   GO   Solid: Impaired(mech soft trials) Presentation: Self Fed;Spoon(assisted; 5 trials) Oral Phase Impairments: Impaired mastication(min increased time - fatigue?) Oral Phase Functional Implications: Impaired mastication(min increased time) Pharyngeal Phase Impairments: (none) Other Comments: pt required assistance w/ feeding       Jerilynn Som, MS, CCC-SLP [place]en 08/23/2017,10:46 AM

## 2017-08-24 LAB — GLUCOSE, CAPILLARY
GLUCOSE-CAPILLARY: 103 mg/dL — AB (ref 65–99)
GLUCOSE-CAPILLARY: 108 mg/dL — AB (ref 65–99)
GLUCOSE-CAPILLARY: 135 mg/dL — AB (ref 65–99)
Glucose-Capillary: 103 mg/dL — ABNORMAL HIGH (ref 65–99)
Glucose-Capillary: 112 mg/dL — ABNORMAL HIGH (ref 65–99)

## 2017-08-24 MED ORDER — IPRATROPIUM-ALBUTEROL 0.5-2.5 (3) MG/3ML IN SOLN
3.0000 mL | Freq: Four times a day (QID) | RESPIRATORY_TRACT | Status: DC | PRN
  Administered 2017-08-24: 3 mL via RESPIRATORY_TRACT
  Filled 2017-08-24: qty 3

## 2017-08-24 MED ORDER — FUROSEMIDE 10 MG/ML IJ SOLN
80.0000 mg | Freq: Once | INTRAMUSCULAR | Status: AC
  Administered 2017-08-24: 80 mg via INTRAVENOUS
  Filled 2017-08-24: qty 8

## 2017-08-24 MED ORDER — EPOETIN ALFA 4000 UNIT/ML IJ SOLN
4000.0000 [IU] | INTRAMUSCULAR | Status: DC
  Administered 2017-08-25 – 2017-08-29 (×3): 4000 [IU] via INTRAVENOUS
  Filled 2017-08-24 (×3): qty 1

## 2017-08-24 NOTE — Progress Notes (Signed)
Pt complaining of shortness of breath.  Boosted pt upright in bed, SAO2 is 94-95% on 1 L [place] O2.  Instructed on cough and deep breath.  Pt with weak effort.  Pt has expiratory wheeze in mid lobe left and right.  Pt scheduled for Ct with contrast to rule out PE in AM and will need HD immediately after secondary to contrast/ESRD. Spoke with Dr Katheren Shams and he ordered duoneb q 6 prn with first dose now.  Spoke with RT and they will come and assess pt. Henriette Combs RN

## 2017-08-24 NOTE — Clinical Social Work Note (Signed)
Clinical Social Work Assessment  Patient Details  Name: Kimberly Montgomery MRN: [id number] Date of Birth: 1965-11-23  Date of referral:  08/24/17               Reason for consult:  Facility Placement                Permission sought to share information with:  Facility Art therapist granted to share information::  Yes, Verbal Permission Granted  Name::        Agency::  [facility]  Relationship::     Contact Information:     Housing/Transportation Living arrangements for the past 2 months:  [place] of Information:  Patient, Medical Team, Facility Patient Interpreter Needed:  None Criminal Activity/Legal Involvement Pertinent to Current Situation/Hospitalization:  No - Comment as needed Significant Relationships:  Adult Children Lives with:  Facility Resident Do you feel safe going back to the place where you live?  Yes Need for family participation in patient care:  No (Coment)  Care giving concerns:  Patient admitted from [facility]   Social Worker assessment / plan:  The CSW met with the patient at bedside to discuss discharge planning. The patient confirmed that she is a LTC resident at [facility] and would like to return when stable. The patient has been a resident for 8 months. The patient receives hemodialysis at [facility] on [address].  [facility] has confirmed that the patient can return when stable. The patient will need EMS for transport. The CSW will continue to follow for discharge facilitation. Of note, the patient's PASRR is a level II that is within the expiration of 11/11/2017 and does not need update.  Employment status:  Disabled (Comment on whether or not currently receiving Disability) Insurance information:  Medicare, Medicaid In [place] PT Recommendations:  [place] / Referral to community resources:     Patient/Family's Response to care:  The patient was pleasant and thanked the  CSW.  Patient/Family's Understanding of and Emotional Response to Diagnosis, Current Treatment, and Prognosis: The patient understands that she will return to her facility of origin and is in agreement.  Emotional Assessment Appearance:  Appears stated age Attitude/Demeanor/Rapport:  Engaged, Gracious Affect (typically observed):  Appropriate, Pleasant Orientation:  Oriented to Self, Oriented to Place, Oriented to  Time, Oriented to Situation Alcohol / Substance use:  Never Used Psych involvement (Current and /or in the community):  Yes (Comment)(Patient has bipolar d/o)  Discharge Needs  Concerns to be addressed:  Discharge Planning Concerns, Care Coordination Readmission within the last 30 days:  No Current discharge risk:  Chronically ill, Psychiatric Illness Barriers to Discharge:  Continued Medical Work up   [employer], LCSW 08/24/2017, 12:04 PM

## 2017-08-24 NOTE — Progress Notes (Signed)
Duoneb given at RN request.  Pt not wheezing & has fine crackles at the bases.

## 2017-08-24 NOTE — Progress Notes (Signed)
Patient ID: Kimberly Montgomery, female   DOB: [birth year], 52 y.o.   MRN: [id number]   [facility] PROGRESS NOTE  JAYLYNE BREESE WUJ:[id number] DOB: May 17, 1965 DOA: 08/21/2017 PCP: Dorothey Baseman, MD  HPI/Subjective: Patient having trouble finding her words and saying them.  Better than 2 days ago and even better than yesterday.  She feels that she is not back to her normal self yet.  Objective: Vitals:   08/24/17 1100 08/24/17 1158  BP:  (!) 162/98  Pulse:  78  Resp: 18 20  Temp:  98 F (36.7 C)  SpO2: 95% 97%    Filed Weights   08/23/17 0500 08/23/17 1745 08/24/17 0416  Weight: 106 kg (233 lb 11 oz) 108.1 kg (238 lb 5.1 oz) 108.6 kg (239 lb 6.7 oz)    ROS: Review of Systems  Constitutional: Negative for chills and fever.  Eyes: Negative for blurred vision.  Respiratory: Negative for cough and shortness of breath.   Cardiovascular: Negative for chest pain.  Gastrointestinal: Negative for abdominal pain, constipation, diarrhea, nausea and vomiting.  Genitourinary: Negative for dysuria.  Musculoskeletal: Negative for joint pain.  Neurological: Negative for dizziness and headaches.   Exam: Physical Exam  Constitutional: She is oriented to person, place, and time.  HENT:  Nose: No mucosal edema.  Mouth/Throat: No oropharyngeal exudate or posterior oropharyngeal edema.  Eyes: Pupils are equal, round, and reactive to light. Conjunctivae and lids are normal.  Neck: Carotid bruit is not present.  Cardiovascular: Regular rhythm, S1 normal, S2 normal and normal heart sounds.  Pulses:      Dorsalis pedis pulses are 2+ on the left side.  Respiratory: No accessory muscle usage. She has decreased breath sounds in the right lower field and the left lower field. She has no wheezes. She has no rhonchi. She has rales in the right lower field and the left lower field.  GI: Soft. Bowel sounds are normal. There is no tenderness.  Musculoskeletal:       Right knee: She exhibits no  swelling.       Left ankle: She exhibits swelling.  Lymphadenopathy:    She has no cervical adenopathy.  Neurological: She is alert and oriented to person, place, and time.  Skin: Skin is warm. No rash noted. Nails show no clubbing.  Psychiatric: She has a normal mood and affect.      Data Reviewed: Basic Metabolic Panel: Recent Labs  Lab 08/21/17 2236 08/22/17 0633 08/22/17 1413 08/23/17 0522  NA 131* 129*  --  136  K 6.2* 5.9*  --  4.3  CL 93* 93*  --  96*  CO2 26 26  --  28  GLUCOSE 90 99  --  100*  BUN 37* 40*  --  19  CREATININE 6.39* 6.83*  --  4.46*  CALCIUM 9.1 8.6*  --  8.5*  MG  --  2.3  --  1.8  PHOS  --  5.9* 5.6* 4.7*   Liver Function Tests: Recent Labs  Lab 08/21/17 2236 08/22/17 0633 08/23/17 0522  AST 15  --   --   ALT 9*  --   --   ALKPHOS 151*  --   --   BILITOT 0.5  --   --   PROT 7.5  --   --   ALBUMIN 3.7 3.3* 3.3*    Recent Labs  Lab 08/21/17 2201 08/22/17 0633  AMMONIA 35 26   CBC: Recent Labs  Lab 08/21/17 2236 08/22/17 9562  WBC 6.0 4.8  HGB 12.4 11.6*  HCT 38.4 34.8*  MCV 88.5 87.5  PLT 260 200   Cardiac Enzymes: Recent Labs  Lab 08/21/17 2236  TROPONINI <0.03   BNP (last 3 results) Recent Labs    08/21/17 2236  BNP 1,509.0*     CBG: Recent Labs  Lab 08/23/17 2036 08/23/17 2350 08/24/17 0412 08/24/17 0731 08/24/17 1135  GLUCAP 127* 127* 103* 103* 108*    Recent Results (from the past 240 hour(s))  MRSA PCR Screening     Status: None   Collection Time: 08/22/17  2:41 AM  Result Value Ref Range Status   MRSA by PCR NEGATIVE NEGATIVE Final    Comment:        The GeneXpert MRSA Assay (FDA approved for NASAL specimens only), is one component of a comprehensive MRSA colonization surveillance program. It is not intended to diagnose MRSA infection nor to guide or monitor treatment for MRSA infections. Performed at [facility], [address], [place], [place] [address]   Urine Culture      Status: Abnormal (Preliminary result)   Collection Time: 08/22/17 11:30 AM  Result Value Ref Range Status   Specimen Description   Final    URINE, RANDOM Performed at [facility], [address], [place], [place] [address]    Special Requests   Final    NONE Performed at [facility], [address], [place], [place] [address]    Culture (A)  Final    >=100,000 COLONIES/mL ENTEROCOCCUS GALLINARUM SUSCEPTIBILITIES TO FOLLOW Performed at [facility], [address], [place], [place] [address]    Report Status PENDING  Incomplete  Culture, blood (routine x 2)     Status: None (Preliminary result)   Collection Time: 08/22/17  3:01 PM  Result Value Ref Range Status   Specimen Description BLOOD HEMODIALYSIS FISTULA  Final   Special Requests   Final    BOTTLES DRAWN AEROBIC AND ANAEROBIC Blood Culture results may not be optimal due to an excessive volume of blood received in culture bottles   Culture   Final    NO GROWTH 2 DAYS Performed at [facility], [address] [place], [place] [address]    Report Status PENDING  Incomplete  Culture, blood (routine x 2)     Status: None (Preliminary result)   Collection Time: 08/22/17  4:11 PM  Result Value Ref Range Status   Specimen Description BLOOD HEMODIALYSIS FISTULA  Final   Special Requests   Final    BOTTLES DRAWN AEROBIC AND ANAEROBIC Blood Culture results may not be optimal due to an excessive volume of blood received in culture bottles   Culture   Final    NO GROWTH 2 DAYS Performed at [facility], [address], [place], [place] [address]    Report Status PENDING  Incomplete     Studies: Mr Brain Wo Contrast  Result Date: 08/22/2017 CLINICAL DATA:  Confusion. Altered mental status. End-stage renal disease. EXAM: MRI HEAD WITHOUT CONTRAST TECHNIQUE: Multiplanar, multiecho pulse sequences of the brain and surrounding structures were obtained without intravenous contrast.  COMPARISON:  Head CT 08/21/2017 Brain MRI 05/31/2016 FINDINGS: BRAIN: The midline structures are normal. There is no acute infarct or acute hemorrhage. There is no mass lesion or other mass effect. There is no hydrocephalus, dural abnormality or extra-axial collection. Mild periventricular white matter hyperintensity. Atrophy is greater than expected for age. Single focus of chronic microhemorrhage in the left  basal ganglia. VASCULAR: Major intracranial arterial and venous sinus flow voids are preserved. SKULL AND UPPER CERVICAL SPINE: The visualized skull base, calvarium, upper cervical spine and extracranial soft tissues are normal. SINUSES/ORBITS: No fluid levels or advanced mucosal thickening. No mastoid or middle ear effusion. The orbits are normal. IMPRESSION: 1. No acute abnormality. 2. Atrophy greater than expected for age. Minimal small vessel disease. Electronically Signed   By: Deatra Robinson M.D.   On: 08/22/2017 22:13   Mr Thoracic Spine Wo Contrast  Result Date: 08/23/2017 CLINICAL DATA:  Low-grade fever. Mid back pain. Altered mental status. End-stage renal disease, evaluate for discitis or spinal infection. EXAM: MRI THORACIC SPINE WITHOUT CONTRAST TECHNIQUE: Multiplanar, multisequence MR imaging of the thoracic spine was performed. No intravenous contrast was administered. COMPARISON:  None. FINDINGS: Alignment:  Physiologic. Vertebrae: No fracture, evidence of discitis, or bone lesion. Cord:  Normal signal and morphology. Paraspinal and other soft tissues: BILATERAL pleural effusions. Tubular 3 cm density layering posteriorly within the pleural space on the LEFT, at the T9-10 level, uncertain significance. Cardiomegaly. Disc levels: No disc protrusion or spinal stenosis. Midthoracic degenerative change with Schmorl's nodes. IMPRESSION: Unremarkable thoracic spine MRI. No thoracic spine vertebral body abnormality, significant disc protrusion, or evidence of spinal infection. BILATERAL pleural  effusions, with unusual tubular 3 cm density layering posteriorly on the LEFT. Consider CT chest with contrast for further evaluation. Electronically Signed   By: Elsie Stain M.D.   On: 08/23/2017 14:22    Scheduled Meds: . aspirin  81 mg Oral Daily  . atorvastatin  40 mg Oral q1800  . clopidogrel  75 mg Oral Q1200  . docusate sodium  100 mg Oral BID  . [START ON 08/25/2017] epoetin (EPOGEN/PROCRIT) injection  4,000 Units Intravenous Q M,W,F-HD  . escitalopram  2.5 mg Oral Daily  . fluticasone  1 spray Each Nare Daily  . gabapentin  300 mg Oral Daily  . heparin  5,000 Units Subcutaneous Q8H  . insulin aspart  0-9 Units Subcutaneous Q4H  . lactulose  10 g Oral q1800  . lidocaine  1 patch Transdermal QHS  . mouth rinse  15 mL Mouth Rinse BID  . Melatonin  5 mg Oral QHS  . metoprolol tartrate  50 mg Oral BID  . multivitamin with minerals  1 tablet Oral Daily  . OXcarbazepine  150 mg Oral QHS  . pantoprazole  40 mg Oral Daily  . polyethylene glycol  17 g Oral Q1200  . polyvinyl alcohol  1 drop Both Eyes TID  . sevelamer carbonate  800 mg Oral TID AC & HS   Continuous Infusions: . levETIRAcetam Stopped (08/23/17 1935)    Assessment/Plan:  1. Acute metabolic encephalopathy.  Unclear etiology currently but could be secondary to medications.  MRI of the brain is negative.  Patient is on medications that could potentially cause altered mental status.  Continue to monitor closely.  Patient continues to improve daily but not back to her baseline as per patient. 2. Acute hypoxic respiratory failure.  Still unable to get off oxygen completely. Give 1 dose of Lasix.  Abnormal imaging of the chest.  CT scan of the chest to rule out pulmonary embolism versus other etiology. 3. end-stage renal disease.  Hemodialysis Monday. 4. Hyperkalemia on presentation status post dialysis 5. Type 2 diabetes mellitus on sliding scale insulin 6. History of seizure on Keppra 7. Hyperlipidemia unspecified on  atorvastatin 8. GERD on Protonix 9. Obesity weight loss needed 10. Physical therapy evaluation  Code Status:     Code Status Orders  (From admission, onward)        Start     Ordered   08/22/17 0212  Full code  Continuous     08/22/17 0211    Code Status History    Date Active Date Inactive Code Status Order ID Comments User Context   07/01/2017 1654 07/03/2017 0404 Full Code [id number]  Enedina Finner, MD Inpatient   05/15/2017 0540 05/21/2017 1406 Full Code [id number]  Ihor Austin, MD Inpatient   05/31/2016 1606 06/11/2016 2013 Full Code [id number]  Kathlene Cote, PA-C ED   03/13/2016 2236 03/16/2016 2033 Full Code [id number]  Oralia Manis, MD ED   02/25/2016 1758 02/27/2016 2332 Full Code [id number]  Shaune Pollack, MD Inpatient   01/19/2016 0454 01/20/2016 2103 Full Code [id number]  Hillary Bow, DO ED   12/25/2015 2001 01/04/2016 2024 Full Code [id number]  Houston Siren, MD Inpatient   11/19/2015 0117 11/21/2015 1956 Full Code [id number]  Hugelmeyer, [place], DO Inpatient   10/06/2015 0208 10/06/2015 1832 Full Code [id number]  Ihor Austin, MD Inpatient   09/13/2015 0214 09/15/2015 1728 Full Code [id number]  Arnaldo Natal, MD Inpatient   08/24/2015 0350 08/31/2015 1847 Full Code [id number]  Ihor Austin, MD Inpatient   07/16/2015 0626 08/02/2015 1848 Full Code [id number]  Ihor Austin, MD ED   07/10/2015 0149 07/12/2015 1301 Full Code [id number]  Oralia Manis, MD Inpatient   05/12/2015 1546 05/16/2015 1735 Full Code [id number]  Auburn Bilberry, MD ED     Family Communication: Left message for daughter Disposition Plan: potentially back to facility once mental status improves back to baseline.  Hopefully that will be either tomorrow or Tuesday  Consultants:  Critical care specialist  Neurology  Time spent: 27 minutes  Ayodeji Keimig [employer]

## 2017-08-24 NOTE — Plan of Care (Signed)
Pt able to engage in conversation today. Pt verbalized that she has problem finding the right words. Weaned O2 to RA, pt O2 sats dropped to 84%. 1L O2 reapplied, O2 sats up to 95%. Dr Renae Gloss is aware.

## 2017-08-24 NOTE — Progress Notes (Signed)
[facility] [place], [place] 08/24/17  Subjective:  This morning, patient appears to be a little more alert and interactive She was able to recognize me.  She also reports fogginess and that her mind is not completely clear yet.  She is able to answer questions appropriately  Objective:  Vital signs in last 24 hours:  Temp:  [97.5 F (36.4 C)-98.4 F (36.9 C)] 98.2 F (36.8 C) (05/26 0923) Pulse Rate:  [60-100] 73 (05/26 0923) Resp:  [13-24] 18 (05/26 0923) BP: (107-183)/(46-82) 169/59 (05/26 0923) SpO2:  [93 %-97 %] 96 % (05/26 0923) Weight:  [108.1 kg (238 lb 5.1 oz)-108.6 kg (239 lb 6.7 oz)] 108.6 kg (239 lb 6.7 oz) (05/26 0416)  Weight change: -0.53 kg (-1 lb 2.7 oz) Filed Weights   08/23/17 0500 08/23/17 1745 08/24/17 0416  Weight: 106 kg (233 lb 11 oz) 108.1 kg (238 lb 5.1 oz) 108.6 kg (239 lb 6.7 oz)    Intake/Output:    Intake/Output Summary (Last 24 hours) at 08/24/2017 1031 Last data filed at 08/24/2017 1015 Gross per 24 hour  Intake 715 ml  Output 200 ml  Net 515 ml     Physical Exam: General: Chronically ill appearing   HEENT Ancteric,   Neck supple  Pulm/lungs  coarse breath sounds  CVS/Heart Regular rhythm  Abdomen:  Soft, NT  Extremities: Rt BKA  Neurologic:  Alert, able to follow commands and answer questions  Skin: No acute rashes  Access: Rt Upper arm AVF       Basic Metabolic Panel:  Recent Labs  Lab 08/21/17 2236 08/22/17 0633 08/22/17 1413 08/23/17 0522  NA 131* 129*  --  136  K 6.2* 5.9*  --  4.3  CL 93* 93*  --  96*  CO2 26 26  --  28  GLUCOSE 90 99  --  100*  BUN 37* 40*  --  19  CREATININE 6.39* 6.83*  --  4.46*  CALCIUM 9.1 8.6*  --  8.5*  MG  --  2.3  --  1.8  PHOS  --  5.9* 5.6* 4.7*     CBC: Recent Labs  Lab 08/21/17 2236 08/22/17 0633  WBC 6.0 4.8  HGB 12.4 11.6*  HCT 38.4 34.8*  MCV 88.5 87.5  PLT 260 200      Lab Results  Component Value Date   HEPBSAG Negative 06/01/2016       Microbiology:  Recent Results (from the past 240 hour(s))  MRSA PCR Screening     Status: None   Collection Time: 08/22/17  2:41 AM  Result Value Ref Range Status   MRSA by PCR NEGATIVE NEGATIVE Final    Comment:        The GeneXpert MRSA Assay (FDA approved for NASAL specimens only), is one component of a comprehensive MRSA colonization surveillance program. It is not intended to diagnose MRSA infection nor to guide or monitor treatment for MRSA infections. Performed at [facility], [address], [place], [place] [address]   Urine Culture     Status: Abnormal (Preliminary result)   Collection Time: 08/22/17 11:30 AM  Result Value Ref Range Status   Specimen Description   Final    URINE, RANDOM Performed at [facility], [address] [place], [place] [address]    Special Requests   Final    NONE Performed at [facility], [address], [place], [place] [address]    Culture (A)  Final    >=  100,000 COLONIES/mL UNIDENTIFIED ORGANISM Performed at [facility], [address] [place], [place] [address]    Report Status PENDING  Incomplete  Culture, blood (routine x 2)     Status: None (Preliminary result)   Collection Time: 08/22/17  3:01 PM  Result Value Ref Range Status   Specimen Description BLOOD HEMODIALYSIS FISTULA  Final   Special Requests   Final    BOTTLES DRAWN AEROBIC AND ANAEROBIC Blood Culture results may not be optimal due to an excessive volume of blood received in culture bottles   Culture   Final    NO GROWTH 2 DAYS Performed at [facility], [address] [place], [place] [address]    Report Status PENDING  Incomplete  Culture, blood (routine x 2)     Status: None (Preliminary result)   Collection Time: 08/22/17  4:11 PM  Result Value Ref Range Status   Specimen Description BLOOD HEMODIALYSIS FISTULA  Final   Special Requests   Final    BOTTLES DRAWN AEROBIC AND ANAEROBIC Blood Culture  results may not be optimal due to an excessive volume of blood received in culture bottles   Culture   Final    NO GROWTH 2 DAYS Performed at [facility], [address] [place], [place] [address]    Report Status PENDING  Incomplete    Coagulation Studies: No results for input(s): LABPROT, INR in the last 72 hours.  Urinalysis: Recent Labs    08/21/17 1300 08/22/17 1130  COLORURINE YELLOW* YELLOW*  LABSPEC 1.015 1.015  PHURINE 6.0 6.0  GLUCOSEU 50* NEGATIVE  HGBUR NEGATIVE NEGATIVE  BILIRUBINUR NEGATIVE NEGATIVE  KETONESUR NEGATIVE NEGATIVE  PROTEINUR >=300* 100*  NITRITE NEGATIVE NEGATIVE  LEUKOCYTESUR NEGATIVE NEGATIVE      Imaging: Mr Brain Wo Contrast  Result Date: 08/22/2017 CLINICAL DATA:  Confusion. Altered mental status. End-stage renal disease. EXAM: MRI HEAD WITHOUT CONTRAST TECHNIQUE: Multiplanar, multiecho pulse sequences of the brain and surrounding structures were obtained without intravenous contrast. COMPARISON:  Head CT 08/21/2017 Brain MRI 05/31/2016 FINDINGS: BRAIN: The midline structures are normal. There is no acute infarct or acute hemorrhage. There is no mass lesion or other mass effect. There is no hydrocephalus, dural abnormality or extra-axial collection. Mild periventricular white matter hyperintensity. Atrophy is greater than expected for age. Single focus of chronic microhemorrhage in the left basal ganglia. VASCULAR: Major intracranial arterial and venous sinus flow voids are preserved. SKULL AND UPPER CERVICAL SPINE: The visualized skull base, calvarium, upper cervical spine and extracranial soft tissues are normal. SINUSES/ORBITS: No fluid levels or advanced mucosal thickening. No mastoid or middle ear effusion. The orbits are normal. IMPRESSION: 1. No acute abnormality. 2. Atrophy greater than expected for age. Minimal small vessel disease. Electronically Signed   By: Deatra Robinson M.D.   On: 08/22/2017 22:13   Mr Thoracic Spine Wo  Contrast  Result Date: 08/23/2017 CLINICAL DATA:  Low-grade fever. Mid back pain. Altered mental status. End-stage renal disease, evaluate for discitis or spinal infection. EXAM: MRI THORACIC SPINE WITHOUT CONTRAST TECHNIQUE: Multiplanar, multisequence MR imaging of the thoracic spine was performed. No intravenous contrast was administered. COMPARISON:  None. FINDINGS: Alignment:  Physiologic. Vertebrae: No fracture, evidence of discitis, or bone lesion. Cord:  Normal signal and morphology. Paraspinal and other soft tissues: BILATERAL pleural effusions. Tubular 3 cm density layering posteriorly within the pleural space on the LEFT, at the T9-10 level, uncertain significance. Cardiomegaly. Disc levels: No disc protrusion or spinal stenosis. Midthoracic degenerative change  with Schmorl's nodes. IMPRESSION: Unremarkable thoracic spine MRI. No thoracic spine vertebral body abnormality, significant disc protrusion, or evidence of spinal infection. BILATERAL pleural effusions, with unusual tubular 3 cm density layering posteriorly on the LEFT. Consider CT chest with contrast for further evaluation. Electronically Signed   By: Elsie Stain M.D.   On: 08/23/2017 14:22     Medications:   . levETIRAcetam Stopped (08/23/17 1935)   . aspirin  81 mg Oral Daily  . atorvastatin  40 mg Oral q1800  . clopidogrel  75 mg Oral Q1200  . docusate sodium  100 mg Oral BID  . escitalopram  2.5 mg Oral Daily  . fluticasone  1 spray Each Nare Daily  . gabapentin  300 mg Oral Daily  . heparin  5,000 Units Subcutaneous Q8H  . insulin aspart  0-9 Units Subcutaneous Q4H  . lactulose  10 g Oral q1800  . lidocaine  1 patch Transdermal QHS  . mouth rinse  15 mL Mouth Rinse BID  . Melatonin  5 mg Oral QHS  . metoprolol tartrate  50 mg Oral BID  . multivitamin with minerals  1 tablet Oral Daily  . OXcarbazepine  150 mg Oral QHS  . pantoprazole  40 mg Oral Daily  . polyethylene glycol  17 g Oral Q1200  . polyvinyl alcohol   1 drop Both Eyes TID  . sevelamer carbonate  800 mg Oral TID AC & HS   acetaminophen **OR** acetaminophen, benztropine, bisacodyl, bisacodyl, clonazePAM **AND** [DISCONTINUED] clonazePAM, hydrALAZINE, lip balm, nitroGLYCERIN, ondansetron **OR** ondansetron (ZOFRAN) IV, traZODone  Assessment/ Plan:  52 y.o. caucaisan female  female with end stage renal disease on hemodialysis, COPD/asthma, diabetes mellitus type II, congestive heart failure, coronary artery disease. 5/24- admitted for altered mental status   [facility]/ [address]/ MWF 107kg Right AVF  1. End Stage Renal Disease with hyperkalemia -Next hemodialysis is planned for Monday  2. AOCKD - Hgb 11.6 - monitor -Low-dose EPO with hemodialysis  3. SHPTH - Monitor Phos Currently 4.7, acceptable  4. Altered mental status and back pain - neurology evaluation is ongoing Brain MRI, thoracic MRI    LOS: 2 Francie Keeling Thedore Mins 5/26/201910:31 AM  [facility] [place], [place] [phone]  Note: This note was prepared with Dragon dictation. Any transcription errors are unintentional

## 2017-08-24 NOTE — [place] FL2 (Signed)
[place] MEDICAID FL2 LEVEL OF CARE SCREENING TOOL     IDENTIFICATION  Patient Name: Kimberly Montgomery Birthdate: 1965/07/18 Sex: female Admission Date (Current Location): 08/21/2017  [place] and [place] Number:  [address] [id number] Facility and Address:  [facility], [address], [place], [place] [address]      Provider Number: [id number]  Attending Physician Name and Address:  Alford Highland, MD  Relative Name and Phone Number:  Santo Held (Daughter) [phone]    Current Level of Care: Hospital Recommended Level of Care: Skilled Nursing Facility Prior Approval Number:    Date Approved/Denied:   PASRR Number: [id number] F expires 11/11/2017  Discharge Plan: SNF    Current Diagnoses: Patient Active Problem List   Diagnosis Date Noted  . Hyperkalemia 07/01/2017  . Pneumonia 05/15/2017  . Benign essential tremor 01/30/2017  . Seizure (HCC)   . Acute encephalopathy   . Tremor   . Cerebrovascular disease   . Complication from renal dialysis device 04/08/2016  . Colitis 03/13/2016  . Chronic diastolic congestive heart failure (HCC) 01/22/2016  . Pressure injury of skin 01/20/2016  . Bacteremia, coagulase-negative staphylococcal 12/27/2015  . Essential hypertension 11/21/2015  . Anemia of chronic disease 11/21/2015  . Acute respiratory failure with hypoxia (HCC) 11/21/2015  . ESRD on dialysis (HCC) 11/21/2015  . 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 NSTEMI (non-ST elevated myocardial infarction) (HCC) 11/20/2015  . Chest pain, rule out acute myocardial infarction 11/18/2015  . Bipolar I disorder, most recent episode depressed (HCC)   . Altered mental status 08/24/2015  . Ileus (HCC)   . Bipolar I disorder (HCC) 07/25/2015  . Seizures (HCC) 07/25/2015  . Peritonitis (HCC) 07/16/2015  . Unstable angina (HCC) 07/09/2015  . Accelerated hypertension 07/09/2015  . Type 2 diabetes mellitus (HCC) 07/09/2015  . CAD (coronary artery disease)  07/09/2015  . HLD (hyperlipidemia) 07/09/2015  . GERD (gastroesophageal reflux disease) 07/09/2015    Orientation RESPIRATION BLADDER Height & Weight     Self, Situation, Place  O2(1L o2 acute) Incontinent Weight: 239 lb 6.7 oz (108.6 kg) Height:  5\' 5"  (165.1 cm)  BEHAVIORAL SYMPTOMS/MOOD NEUROLOGICAL BOWEL NUTRITION STATUS      Continent Diet(Dysphagia 3, thin liquids)  AMBULATORY STATUS COMMUNICATION OF NEEDS Skin   Extensive Assist Verbally Normal                       Personal Care Assistance Level of Assistance  Bathing, Feeding, Dressing Bathing Assistance: Limited assistance Feeding assistance: Limited assistance Dressing Assistance: Limited assistance     Functional Limitations Info  Sight, Hearing, Speech Sight Info: Adequate Hearing Info: Adequate Speech Info: Adequate    SPECIAL CARE FACTORS FREQUENCY  PT (By licensed PT)     PT Frequency: Up to 5X per week              Contractures Contractures Info: Not present    Additional Factors Info  Code Status, Allergies, Psychotropic, Insulin Sliding Scale Code Status Info: Full Allergies Info: Cephalosporins, Penicillins, Reglan Metoclopramide, Risperdal Risperidone, Lamictal Lamotrigine, Phenergan Promethazine Hcl, Pravastatin, Sulfa Antibiotics Psychotropic Info: Gabapentin, Klonopin, Cogentin, Lexapro, Keppra Insulin Sliding Scale Info: Novolog: 0-9 units Q4H       Current Medications (08/24/2017):  This is the current hospital active medication list Current Facility-Administered Medications  Medication Dose Route Frequency Provider Last Rate Last Dose  . acetaminophen (TYLENOL) tablet 650 mg  650 mg Oral Q6H PRN Cammy Copa, MD   650 mg at 08/24/17 0022  Or  . acetaminophen (TYLENOL) suppository 650 mg  650 mg Rectal Q6H PRN Cammy Copa, MD      . aspirin chewable tablet 81 mg  81 mg Oral Daily Cammy Copa, MD   81 mg at 08/24/17 1594  . atorvastatin (LIPITOR) tablet 40 mg  40 mg Oral  q1800 Cammy Copa, MD   40 mg at 08/23/17 1809  . benztropine (COGENTIN) tablet 0.5 mg  0.5 mg Oral Daily PRN Cammy Copa, MD      . bisacodyl (DULCOLAX) EC tablet 5 mg  5 mg Oral Daily PRN Cammy Copa, MD      . bisacodyl (DULCOLAX) suppository 10 mg  10 mg Rectal PRN Cammy Copa, MD      . clonazePAM Scarlette Calico) tablet 0.25 mg  0.25 mg Oral BID PRN Cammy Copa, MD      . clopidogrel (PLAVIX) tablet 75 mg  75 mg Oral Q1200 Cammy Copa, MD   75 mg at 08/23/17 1239  . docusate sodium (COLACE) capsule 100 mg  100 mg Oral BID Cammy Copa, MD   100 mg at 08/24/17 5859  . [START ON 08/25/2017] epoetin alfa (EPOGEN,PROCRIT) injection 4,000 Units  4,000 Units Intravenous Q M,W,F-HD Mosetta Pigeon, MD      . escitalopram (LEXAPRO) tablet 2.5 mg  2.5 mg Oral Daily Cammy Copa, MD   2.5 mg at 08/24/17 2924  . fluticasone (FLONASE) 50 MCG/ACT nasal spray 1 spray  1 spray Each Nare Daily Cammy Copa, MD   1 spray at 08/24/17 [phone]  . gabapentin (NEURONTIN) capsule 300 mg  300 mg Oral Daily Cammy Copa, MD   300 mg at 08/24/17 6381  . heparin injection 5,000 Units  5,000 Units Subcutaneous Q8H Cammy Copa, MD   5,000 Units at 08/24/17 0510  . hydrALAZINE (APRESOLINE) injection 10-20 mg  10-20 mg Intravenous Q4H PRN Tukov-Yual, Magdalene S, NP   10 mg at 08/23/17 1150  . insulin aspart (novoLOG) injection 0-9 Units  0-9 Units Subcutaneous Q4H Tukov-Yual, Magdalene S, NP   1 Units at 08/24/17 0022  . lactulose (CHRONULAC) 10 GM/15ML solution 10 g  10 g Oral q1800 Cammy Copa, MD   10 g at 08/23/17 1924  . levETIRAcetam (KEPPRA) IVPB 1500 mg/ 100 mL premix  1,500 mg Intravenous q1800 Annett Fabian, MD   Stopped at 08/23/17 1935  . lidocaine (LIDODERM) 5 % 1 patch  1 patch Transdermal QHS Cammy Copa, MD      . lip balm (BLISTEX) ointment   Topical PRN Annett Fabian, MD      . MEDLINE mouth rinse  15 mL Mouth Rinse BID Tukov-Yual, Magdalene S, NP   15 mL at 08/24/17 0929  . Melatonin  TABS 5 mg  5 mg Oral QHS Cammy Copa, MD   5 mg at 08/23/17 2206  . metoprolol tartrate (LOPRESSOR) tablet 50 mg  50 mg Oral BID Cammy Copa, MD   50 mg at 08/24/17 7711  . multivitamin with minerals tablet 1 tablet  1 tablet Oral Daily Cammy Copa, MD   1 tablet at 08/24/17 [phone]  . nitroGLYCERIN (NITROSTAT) SL tablet 0.4 mg  0.4 mg Sublingual Q5 min PRN Cammy Copa, MD      . ondansetron [facility]) tablet 4 mg  4 mg Oral Q6H PRN Cammy Copa, MD       Or  . ondansetron [facility]) injection 4 mg  4 mg Intravenous Q6H PRN Cammy Copa, MD   4 mg at 08/22/17 2100  .  OXcarbazepine (TRILEPTAL) tablet 150 mg  150 mg Oral QHS Cammy Copa, MD   150 mg at 08/23/17 2206  . pantoprazole (PROTONIX) EC tablet 40 mg  40 mg Oral Daily Cammy Copa, MD   40 mg at 08/24/17 1610  . polyethylene glycol (MIRALAX / GLYCOLAX) packet 17 g  17 g Oral Q1200 Cammy Copa, MD   17 g at 08/23/17 1241  . polyvinyl alcohol (LIQUIFILM TEARS) 1.4 % ophthalmic solution 1 drop  1 drop Both Eyes TID Cammy Copa, MD   1 drop at 08/24/17 0929  . sevelamer carbonate (RENVELA) tablet 800 mg  800 mg Oral TID AC & HS Cammy Copa, MD   800 mg at 08/24/17 9604  . traZODone (DESYREL) tablet 25 mg  25 mg Oral QHS PRN Cammy Copa, MD   25 mg at 08/23/17 2128     Discharge Medications: Please see discharge summary for a list of discharge medications.  Relevant Imaging Results:  Relevant Lab Results:   Additional Information SS#[id number]  Judi Cong, LCSW

## 2017-08-24 NOTE — Plan of Care (Signed)

## 2017-08-25 ENCOUNTER — Inpatient Hospital Stay: Payer: Medicare HMO

## 2017-08-25 ENCOUNTER — Encounter: Payer: Self-pay | Admitting: Radiology

## 2017-08-25 DIAGNOSIS — J9601 Acute respiratory failure with hypoxia: Secondary | ICD-10-CM

## 2017-08-25 LAB — CBC
HCT: 33.7 % — ABNORMAL LOW (ref 35.0–47.0)
Hemoglobin: 11 g/dL — ABNORMAL LOW (ref 12.0–16.0)
MCH: 28.8 pg (ref 26.0–34.0)
MCHC: 32.7 g/dL (ref 32.0–36.0)
MCV: 88 fL (ref 80.0–100.0)
PLATELETS: 239 10*3/uL (ref 150–440)
RBC: 3.83 MIL/uL (ref 3.80–5.20)
RDW: 16.5 % — AB (ref 11.5–14.5)
WBC: 9.8 10*3/uL (ref 3.6–11.0)

## 2017-08-25 LAB — TROPONIN I
Troponin I: 3.02 ng/mL (ref <0.03)
Troponin I: 2.84 ng/mL (ref <0.03)
Troponin I: 2.45 ng/mL (ref <0.03)

## 2017-08-25 LAB — URINE CULTURE

## 2017-08-25 LAB — BLOOD GAS, ARTERIAL
Acid-Base Excess: 9.5 mmol/L — ABNORMAL HIGH (ref 0.0–2.0)
Bicarbonate: 36.3 mmol/L — ABNORMAL HIGH (ref 20.0–28.0)
FIO2: 1
O2 Saturation: 99.7 %
PCO2 ART: 60 mmHg — AB (ref 32.0–48.0)
PH ART: 7.39 (ref 7.350–7.450)
Patient temperature: 37
pO2, Arterial: 199 mmHg — ABNORMAL HIGH (ref 83.0–108.0)

## 2017-08-25 LAB — PROTIME-INR
INR: 1
Prothrombin Time: 13.1 seconds (ref 11.4–15.2)

## 2017-08-25 LAB — GLUCOSE, CAPILLARY
GLUCOSE-CAPILLARY: 112 mg/dL — AB (ref 65–99)
GLUCOSE-CAPILLARY: 168 mg/dL — AB (ref 65–99)
Glucose-Capillary: 100 mg/dL — ABNORMAL HIGH (ref 65–99)
Glucose-Capillary: 145 mg/dL — ABNORMAL HIGH (ref 65–99)
Glucose-Capillary: 147 mg/dL — ABNORMAL HIGH (ref 65–99)
Glucose-Capillary: 181 mg/dL — ABNORMAL HIGH (ref 65–99)
Glucose-Capillary: 208 mg/dL — ABNORMAL HIGH (ref 65–99)

## 2017-08-25 LAB — APTT: APTT: 36 s (ref 24–36)

## 2017-08-25 MED ORDER — IOPAMIDOL (ISOVUE-370) INJECTION 76%
75.0000 mL | Freq: Once | INTRAVENOUS | Status: AC | PRN
  Administered 2017-08-25: 75 mL via INTRAVENOUS

## 2017-08-25 MED ORDER — METHYLPREDNISOLONE SODIUM SUCC 40 MG IJ SOLR
40.0000 mg | Freq: Every day | INTRAMUSCULAR | Status: DC
Start: 2017-08-25 — End: 2017-08-26
  Administered 2017-08-25: 16:00:00 40 mg via INTRAVENOUS
  Filled 2017-08-25 (×2): qty 1

## 2017-08-25 MED ORDER — FLEET ENEMA 7-19 GM/118ML RE ENEM: 1.0000 | ENEMA | Freq: Every day | RECTAL | Status: DC | PRN

## 2017-08-25 MED ORDER — HEPARIN (PORCINE) IN NACL 100-0.45 UNIT/ML-% IJ SOLN
1700.0000 [IU]/h | INTRAMUSCULAR | Status: DC
  Administered 2017-08-25: 1250 [IU]/h via INTRAVENOUS
  Administered 2017-08-26: 1450 [IU]/h via INTRAVENOUS
  Administered 2017-08-27 (×2): 1650 [IU]/h via INTRAVENOUS
  Filled 2017-08-25 (×4): qty 250

## 2017-08-25 MED ORDER — LEVETIRACETAM 500 MG PO TABS
1500.0000 mg | ORAL_TABLET | Freq: Every day | ORAL | Status: DC
  Administered 2017-08-25 – 2017-08-29 (×5): 1500 mg via ORAL
  Filled 2017-08-25 (×2): qty 2
  Filled 2017-08-25: qty 3
  Filled 2017-08-25 (×2): qty 2

## 2017-08-25 MED ORDER — AZTREONAM 1 G IJ SOLR
500.0000 mg | Freq: Three times a day (TID) | INTRAMUSCULAR | Status: DC
  Administered 2017-08-25 – 2017-08-29 (×11): 500 mg via INTRAVENOUS
  Filled 2017-08-25 (×16): qty 0.5

## 2017-08-25 MED ORDER — AZTREONAM 1 G IJ SOLR: 0.5000 g | Freq: Three times a day (TID) | INTRAMUSCULAR | Status: DC

## 2017-08-25 MED ORDER — BISACODYL 10 MG RE SUPP
10.0000 mg | Freq: Once | RECTAL | Status: AC
  Administered 2017-08-25: 10 mg via RECTAL
  Filled 2017-08-25 (×2): qty 1

## 2017-08-25 MED ORDER — FUROSEMIDE 10 MG/ML IJ SOLN
80.0000 mg | Freq: Once | INTRAMUSCULAR | Status: AC
Start: 2017-08-25 — End: 2017-08-25
  Administered 2017-08-25: 16:00:00 80 mg via INTRAVENOUS
  Filled 2017-08-25: qty 8

## 2017-08-25 MED ORDER — LINEZOLID 600 MG PO TABS
600.0000 mg | ORAL_TABLET | Freq: Two times a day (BID) | ORAL | Status: DC
  Administered 2017-08-25 – 2017-08-29 (×7): 600 mg via ORAL
  Filled 2017-08-25 (×12): qty 1

## 2017-08-25 MED ORDER — NITROGLYCERIN IN D5W 200-5 MCG/ML-% IV SOLN
INTRAVENOUS | Status: AC
  Filled 2017-08-25: qty 250

## 2017-08-25 MED ORDER — NITROGLYCERIN IN D5W 200-5 MCG/ML-% IV SOLN
0.0000 ug/min | INTRAVENOUS | Status: DC
Start: 2017-08-25 — End: 2017-08-29
  Administered 2017-08-25: 5 ug/min via INTRAVENOUS
  Administered 2017-08-26: 200 ug/min via INTRAVENOUS
  Administered 2017-08-26: 90 ug/min via INTRAVENOUS
  Administered 2017-08-26: 180 ug/min via INTRAVENOUS
  Filled 2017-08-25 (×3): qty 250

## 2017-08-25 MED ORDER — BUDESONIDE 0.5 MG/2ML IN SUSP
0.5000 mg | Freq: Two times a day (BID) | RESPIRATORY_TRACT | Status: DC
  Administered 2017-08-25 – 2017-08-30 (×10): 0.5 mg via RESPIRATORY_TRACT
  Filled 2017-08-25 (×9): qty 2

## 2017-08-25 MED ORDER — AZTREONAM 1 G IJ SOLR
0.5000 g | Freq: Three times a day (TID) | INTRAMUSCULAR | Status: DC
  Filled 2017-08-25: qty 0.5

## 2017-08-25 MED ORDER — IPRATROPIUM-ALBUTEROL 0.5-2.5 (3) MG/3ML IN SOLN
3.0000 mL | Freq: Four times a day (QID) | RESPIRATORY_TRACT | Status: DC
  Administered 2017-08-25 – 2017-08-30 (×18): 3 mL via RESPIRATORY_TRACT
  Filled 2017-08-25 (×17): qty 3

## 2017-08-25 NOTE — Progress Notes (Signed)
Pre HD assessment    08/25/17 0958  Vital Signs  Temp 98.6 F (37 C)  Temp Source Oral  Pulse Rate 68  Pulse Rate Source Monitor  Resp (!) 22  BP (!) 176/64  BP Location Left Wrist  BP Method Automatic  Patient Position (if appropriate) Lying  Oxygen Therapy  SpO2 99 %  O2 Device Nasal Cannula  O2 Flow Rate (L/min) 0.5 L/min  Pain Assessment  Pain Scale 0-10  Pain Score 0  Dialysis Weight  Weight 111.6 kg (246 lb 0.5 oz)  Type of Weight Pre-Dialysis  Time-Out for Hemodialysis  What Procedure? HD  Pt Identifiers(min of two) First/Last Name;MRN/Account#  Correct Site? Yes  Correct Side? Yes  Correct Procedure? Yes  Consents Verified? Yes  Rad Studies Available? N/A  Safety Precautions Reviewed? Yes  Biochemist, clinical Number  (7A)  Station Number 1  UF/Alarm Test Passed  Conductivity: Meter 13.8  Conductivity: Machine  14  pH 7.6  Reverse Osmosis main  Normal Saline Lot Number [id number]  Dialyzer Lot Number [id number]  Disposable Set Lot Number [id number]  Machine Temperature 98.6 F (37 C)  Immunologist and Audible Yes  Blood Lines Intact and Secured Yes  Pre Treatment Patient Checks  Vascular access used during treatment Fistula  Hepatitis B Surface Antigen Results Negative  Date Hepatitis B Surface Antigen Drawn 06/01/16  Hepatitis B Surface Antibody  (>10)  Date Hepatitis B Surface Antibody Drawn 01/06/17  Hemodialysis Consent Verified Yes  Hemodialysis Standing Orders Initiated Yes  ECG (Telemetry) Monitor On Yes  Prime Ordered Normal Saline  Length of  DialysisTreatment -hour(s) 3.5 Hour(s)  Dialyzer Elisio 17H NR  Dialysate 2K, 2.5 Ca  Dialysis Anticoagulant None  Dialysate Flow Ordered 800  Blood Flow Rate Ordered 400 mL/min  Ultrafiltration Goal 1 Liters  Dialysis Blood Pressure Support Ordered Normal Saline  Education / Care Plan  Dialysis Education Provided Yes  Documented Education in Care Plan Yes  Fistula / Graft Right Upper arm  Arteriovenous fistula  Placement Date/Time: 08/23/16 0915   Orientation: Right  Access Location: Upper arm  Access Type: Arteriovenous fistula  Site Condition No complications  Fistula / Graft Assessment Present;Thrill;Bruit  Drainage Description None

## 2017-08-25 NOTE — Progress Notes (Signed)
[facility], [place] 08/25/17  Subjective:  Patient seen and evaluated during hemodialysis. Blood flow rate 400. Ultrafiltration target 1.0 kg.  Objective:  Vital signs in last 24 hours:  Temp:  [98 F (36.7 C)-98.6 F (37 C)] 98.6 F (37 C) (05/27 0958) Pulse Rate:  [65-82] 68 (05/27 1130) Resp:  [18-22] 21 (05/27 1130) BP: (156-190)/(57-98) 173/66 (05/27 1130) SpO2:  [84 %-99 %] 95 % (05/27 1130) Weight:  [110.7 kg (244 lb)-111.6 kg (246 lb 0.5 oz)] 111.6 kg (246 lb 0.5 oz) (05/27 0958)  Weight change: 2.578 kg (5 lb 10.9 oz) Filed Weights   08/24/17 0416 08/25/17 0425 08/25/17 0958  Weight: 108.6 kg (239 lb 6.7 oz) 110.7 kg (244 lb) 111.6 kg (246 lb 0.5 oz)    Intake/Output:    Intake/Output Summary (Last 24 hours) at 08/25/2017 1134 Last data filed at 08/25/2017 1006 Gross per 24 hour  Intake 600 ml  Output 200 ml  Net 400 ml     Physical Exam: General: Chronically ill appearing   HEENT Ancteric  Neck supple  Pulm/lungs coarse breath sounds  CVS/Heart Regular rhythm  Abdomen:  Soft, NTND  Extremities: Rt BKA  Neurologic:  Alert, able to follow commands and answer questions  Skin: No acute rashes  Access: Rt Upper arm AVF       Basic Metabolic Panel:  Recent Labs  Lab 08/21/17 2236 08/22/17 0633 08/22/17 1413 08/23/17 0522  NA 131* 129*  --  136  K 6.2* 5.9*  --  4.3  CL 93* 93*  --  96*  CO2 26 26  --  28  GLUCOSE 90 99  --  100*  BUN 37* 40*  --  19  CREATININE 6.39* 6.83*  --  4.46*  CALCIUM 9.1 8.6*  --  8.5*  MG  --  2.3  --  1.8  PHOS  --  5.9* 5.6* 4.7*     CBC: Recent Labs  Lab 08/21/17 2236 08/22/17 0633  WBC 6.0 4.8  HGB 12.4 11.6*  HCT 38.4 34.8*  MCV 88.5 87.5  PLT 260 200      Lab Results  Component Value Date   HEPBSAG Negative 06/01/2016      Microbiology:  Recent Results (from the past 240 hour(s))  MRSA PCR Screening     Status: None   Collection Time: 08/22/17  2:41 AM   Result Value Ref Range Status   MRSA by PCR NEGATIVE NEGATIVE Final    Comment:        The GeneXpert MRSA Assay (FDA approved for NASAL specimens only), is one component of a comprehensive MRSA colonization surveillance program. It is not intended to diagnose MRSA infection nor to guide or monitor treatment for MRSA infections. Performed at [facility], [address] [place], [place] [address]   Urine Culture     Status: Abnormal   Collection Time: 08/22/17 11:30 AM  Result Value Ref Range Status   Specimen Description   Final    URINE, RANDOM Performed at [facility], [address], [place], [place] [address]    Special Requests   Final    NONE Performed at [facility], [address] [place], [place] [address]    Culture >=100,000 COLONIES/mL ENTEROCOCCUS GALLINARUM (A)  Final   Report Status 08/25/2017 FINAL  Final   Organism ID, Bacteria ENTEROCOCCUS GALLINARUM (A)  Final      Susceptibility   Enterococcus gallinarum - MIC*  AMPICILLIN <=2 SENSITIVE Sensitive     LEVOFLOXACIN >=8 RESISTANT Resistant     NITROFURANTOIN <=16 SENSITIVE Sensitive     VANCOMYCIN RESISTANT Resistant     LINEZOLID 2 SENSITIVE Sensitive     * >=100,000 COLONIES/mL ENTEROCOCCUS GALLINARUM  Culture, blood (routine x 2)     Status: None (Preliminary result)   Collection Time: 08/22/17  3:01 PM  Result Value Ref Range Status   Specimen Description BLOOD HEMODIALYSIS FISTULA  Final   Special Requests   Final    BOTTLES DRAWN AEROBIC AND ANAEROBIC Blood Culture results may not be optimal due to an excessive volume of blood received in culture bottles   Culture   Final    NO GROWTH 3 DAYS Performed at [facility], [address] [place], [place] [address]    Report Status PENDING  Incomplete  Culture, blood (routine x 2)     Status: None (Preliminary result)   Collection Time: 08/22/17  4:11 PM  Result Value Ref Range Status   Specimen  Description BLOOD HEMODIALYSIS FISTULA  Final   Special Requests   Final    BOTTLES DRAWN AEROBIC AND ANAEROBIC Blood Culture results may not be optimal due to an excessive volume of blood received in culture bottles   Culture   Final    NO GROWTH 3 DAYS Performed at [facility], [address] [place], [place] [address]    Report Status PENDING  Incomplete    Coagulation Studies: No results for input(s): LABPROT, INR in the last 72 hours.  Urinalysis: No results for input(s): COLORURINE, LABSPEC, PHURINE, GLUCOSEU, HGBUR, BILIRUBINUR, KETONESUR, PROTEINUR, UROBILINOGEN, NITRITE, LEUKOCYTESUR in the last 72 hours.  Invalid input(s): APPERANCEUR    Imaging: Ct Angio Chest Pe W Or Wo Contrast  Result Date: 08/25/2017 CLINICAL DATA:  Acute hypoxic respiratory failure. Shortness of breath. EXAM: CT ANGIOGRAPHY CHEST WITH CONTRAST TECHNIQUE: Multidetector CT imaging of the chest was performed using the standard protocol during bolus administration of intravenous contrast. Multiplanar CT image reconstructions and MIPs were obtained to evaluate the vascular anatomy. CONTRAST:  75mL ISOVUE-370 IOPAMIDOL (ISOVUE-370) INJECTION 76% COMPARISON:  Chest radiograph 08/21/2017 FINDINGS: Cardiovascular: Moderately good opacification of central and segmental pulmonary arteries. No focal filling defects are identified. No evidence of significant pulmonary embolus. Mild cardiac enlargement. No pericardial effusions. Coronary artery calcifications. Normal caliber thoracic aorta with scattered calcification. Mediastinum/Nodes: Esophagus is decompressed. Moderately prominent lymph nodes throughout the upper mediastinum. Pretracheal lymph nodes measure up to 2.2 cm diameter. Changes are nonspecific in could represent reactive lymph nodes, metastatic nodes, or lymphoproliferative changes. Lungs/Pleura: Motion artifact limits evaluation. Small to moderate bilateral pleural effusions with basilar  atelectasis. Hazy mosaic appearance to the lung parenchyma may represent motion artifact, edema, or air trapping. No pneumothorax. Airways are patent. Upper Abdomen: No acute abnormality. Musculoskeletal: Degenerative changes in the spine. No destructive bone lesions. Review of the MIP images confirms the above findings. IMPRESSION: 1. No evidence of significant pulmonary embolus. 2. Probable congestive changes with cardiac enlargement, bilateral pleural effusions, and mosaic attenuation pattern to the lungs which may indicate edema or motion artifact. 3. Mediastinal lymphadenopathy of nonspecific etiology. 4. Atelectasis in the lung bases. 5. Technically limited examination due to motion artifact. Aortic Atherosclerosis (ICD10-I70.0). Electronically Signed   By: Burman Nieves M.D.   On: 08/25/2017 06:57   Mr Thoracic Spine Wo Contrast  Result Date: 08/23/2017 CLINICAL DATA:  Low-grade fever. Mid back pain. Altered mental status. End-stage renal disease, evaluate for discitis  or spinal infection. EXAM: MRI THORACIC SPINE WITHOUT CONTRAST TECHNIQUE: Multiplanar, multisequence MR imaging of the thoracic spine was performed. No intravenous contrast was administered. COMPARISON:  None. FINDINGS: Alignment:  Physiologic. Vertebrae: No fracture, evidence of discitis, or bone lesion. Cord:  Normal signal and morphology. Paraspinal and other soft tissues: BILATERAL pleural effusions. Tubular 3 cm density layering posteriorly within the pleural space on the LEFT, at the T9-10 level, uncertain significance. Cardiomegaly. Disc levels: No disc protrusion or spinal stenosis. Midthoracic degenerative change with Schmorl's nodes. IMPRESSION: Unremarkable thoracic spine MRI. No thoracic spine vertebral body abnormality, significant disc protrusion, or evidence of spinal infection. BILATERAL pleural effusions, with unusual tubular 3 cm density layering posteriorly on the LEFT. Consider CT chest with contrast for further  evaluation. Electronically Signed   By: Elsie Stain M.D.   On: 08/23/2017 14:22     Medications:    . aspirin  81 mg Oral Daily  . atorvastatin  40 mg Oral q1800  . bisacodyl  10 mg Rectal Once  . clopidogrel  75 mg Oral Q1200  . docusate sodium  100 mg Oral BID  . epoetin (EPOGEN/PROCRIT) injection  4,000 Units Intravenous Q M,W,F-HD  . escitalopram  2.5 mg Oral Daily  . fluticasone  1 spray Each Nare Daily  . gabapentin  300 mg Oral Daily  . heparin  5,000 Units Subcutaneous Q8H  . insulin aspart  0-9 Units Subcutaneous Q4H  . lactulose  10 g Oral q1800  . levETIRAcetam  1,500 mg Oral q1800  . lidocaine  1 patch Transdermal QHS  . mouth rinse  15 mL Mouth Rinse BID  . Melatonin  5 mg Oral QHS  . metoprolol tartrate  50 mg Oral BID  . multivitamin with minerals  1 tablet Oral Daily  . OXcarbazepine  150 mg Oral QHS  . pantoprazole  40 mg Oral Daily  . polyethylene glycol  17 g Oral Q1200  . polyvinyl alcohol  1 drop Both Eyes TID  . sevelamer carbonate  800 mg Oral TID AC & HS   acetaminophen **OR** acetaminophen, benztropine, bisacodyl, bisacodyl, clonazePAM **AND** [DISCONTINUED] clonazePAM, hydrALAZINE, ipratropium-albuterol, lip balm, nitroGLYCERIN, ondansetron **OR** ondansetron (ZOFRAN) IV, sodium phosphate, traZODone  Assessment/ Plan:  52 y.o. caucaisan female  female with end stage renal disease on hemodialysis, COPD/asthma, diabetes mellitus type II, congestive heart failure, coronary artery disease. 5/24- admitted for altered mental status   [facility] [address]/ MWF 107kg Right AVF  1. End Stage Renal Disease with hyperkalemia -Patient seen and evaluated during dialysis.  Ultrafiltration target 1 kg.  2. AOCKD -Continue Epogen 4000 units IV with dialysis.  3. SHPTH -Phosphorus currently at target.  Continue Renvela 800 mg p.o.  4. Altered mental status and back pain -Brain MRI was negative for acute CVA.  Thoracic MRI also normal.     LOS: 3 Barack Nicodemus 5/27/201911:34 AM  [facility] [place], [place] [phone]  Note: This note was prepared with Dragon dictation. Any transcription errors are unintentional

## 2017-08-25 NOTE — Progress Notes (Signed)
Patient ID: Kimberly Montgomery, female   DOB: [birth year], 52 y.o.   MRN: [id number]    [facility] PROGRESS NOTE  Kimberly Montgomery XTA:[id number] DOB: 1965-06-01 DOA: 08/21/2017 PCP: Dorothey Baseman, MD  HPI/Subjective: Patient was seen this morning and she is feeling better.  She still does not feel like her mental status is back to baseline.  She did not sleep last night.  She did not recall having a CT scan this morning.  Just now I spoke with the dialysis nurse and they put her on 100% nonrebreather and gave her nitroglycerin for chest pain and they think she is having anxiety attack.  Objective: Vitals:   08/25/17 1315 08/25/17 1330  BP: (!) 193/74 (!) 194/76  Pulse: 83 84  Resp: (!) 37 20  Temp:    SpO2: 97% 94%    Filed Weights   08/24/17 0416 08/25/17 0425 08/25/17 0958  Weight: 108.6 kg (239 lb 6.7 oz) 110.7 kg (244 lb) 111.6 kg (246 lb 0.5 oz)    ROS: Review of Systems  Constitutional: Negative for chills and fever.  Eyes: Negative for blurred vision.  Respiratory: Negative for cough and shortness of breath.   Cardiovascular: Negative for chest pain.  Gastrointestinal: Negative for abdominal pain, constipation, diarrhea, nausea and vomiting.  Genitourinary: Negative for dysuria.  Musculoskeletal: Negative for joint pain.  Neurological: Negative for dizziness and headaches.   Exam: Physical Exam  Constitutional: She is oriented to person, place, and time.  HENT:  Nose: No mucosal edema.  Mouth/Throat: No oropharyngeal exudate or posterior oropharyngeal edema.  Eyes: Pupils are equal, round, and reactive to light. Conjunctivae and lids are normal.  Neck: Carotid bruit is not present.  Cardiovascular: Regular rhythm, S1 normal, S2 normal and normal heart sounds.  Pulses:      Dorsalis pedis pulses are 2+ on the left side.  Respiratory: No accessory muscle usage. She has decreased breath sounds in the right lower field and the left lower field. She has no wheezes.  She has no rhonchi. She has rales in the right lower field and the left lower field.  GI: Soft. Bowel sounds are normal. There is no tenderness.  Musculoskeletal:       Right knee: She exhibits no swelling.       Left ankle: She exhibits swelling.  Lymphadenopathy:    She has no cervical adenopathy.  Neurological: She is alert and oriented to person, place, and time.  Skin: Skin is warm. No rash noted. Nails show no clubbing.  Psychiatric: She has a normal mood and affect.      Data Reviewed: Basic Metabolic Panel: Recent Labs  Lab 08/21/17 2236 08/22/17 0633 08/22/17 1413 08/23/17 0522  NA 131* 129*  --  136  K 6.2* 5.9*  --  4.3  CL 93* 93*  --  96*  CO2 26 26  --  28  GLUCOSE 90 99  --  100*  BUN 37* 40*  --  19  CREATININE 6.39* 6.83*  --  4.46*  CALCIUM 9.1 8.6*  --  8.5*  MG  --  2.3  --  1.8  PHOS  --  5.9* 5.6* 4.7*   Liver Function Tests: Recent Labs  Lab 08/21/17 2236 08/22/17 0633 08/23/17 0522  AST 15  --   --   ALT 9*  --   --   ALKPHOS 151*  --   --   BILITOT 0.5  --   --   PROT 7.5  --   --  ALBUMIN 3.7 3.3* 3.3*    Recent Labs  Lab 08/21/17 2201 08/22/17 0633  AMMONIA 35 26   CBC: Recent Labs  Lab 08/21/17 2236 08/22/17 0633  WBC 6.0 4.8  HGB 12.4 11.6*  HCT 38.4 34.8*  MCV 88.5 87.5  PLT 260 200   Cardiac Enzymes: Recent Labs  Lab 08/21/17 2236  TROPONINI <0.03   BNP (last 3 results) Recent Labs    08/21/17 2236  BNP 1,509.0*     CBG: Recent Labs  Lab 08/24/17 1632 08/24/17 1957 08/25/17 0004 08/25/17 0400 08/25/17 0743  GLUCAP 112* 135* 168* 145* 100*    Recent Results (from the past 240 hour(s))  MRSA PCR Screening     Status: None   Collection Time: 08/22/17  2:41 AM  Result Value Ref Range Status   MRSA by PCR NEGATIVE NEGATIVE Final    Comment:        The GeneXpert MRSA Assay (FDA approved for NASAL specimens only), is one component of a comprehensive MRSA colonization surveillance program. It is  not intended to diagnose MRSA infection nor to guide or monitor treatment for MRSA infections. Performed at [facility], [place], [address] [place], [place] [address]   Urine Culture     Status: Abnormal   Collection Time: 08/22/17 11:30 AM  Result Value Ref Range Status   Specimen Description   Final    URINE, RANDOM Performed at [facility], [address] [place], [place] [address]    Special Requests   Final    NONE Performed at [facility], [address] [place], [place] [address]    Culture >=100,000 COLONIES/mL ENTEROCOCCUS GALLINARUM (A)  Final   Report Status 08/25/2017 FINAL  Final   Organism ID, Bacteria ENTEROCOCCUS GALLINARUM (A)  Final      Susceptibility   Enterococcus gallinarum - MIC*    AMPICILLIN <=2 SENSITIVE Sensitive     LEVOFLOXACIN >=8 RESISTANT Resistant     NITROFURANTOIN <=16 SENSITIVE Sensitive     VANCOMYCIN RESISTANT Resistant     LINEZOLID 2 SENSITIVE Sensitive     * >=100,000 COLONIES/mL ENTEROCOCCUS GALLINARUM  Culture, blood (routine x 2)     Status: None (Preliminary result)   Collection Time: 08/22/17  3:01 PM  Result Value Ref Range Status   Specimen Description BLOOD HEMODIALYSIS FISTULA  Final   Special Requests   Final    BOTTLES DRAWN AEROBIC AND ANAEROBIC Blood Culture results may not be optimal due to an excessive volume of blood received in culture bottles   Culture   Final    NO GROWTH 3 DAYS Performed at [facility], [address], [place], [place] [address]    Report Status PENDING  Incomplete  Culture, blood (routine x 2)     Status: None (Preliminary result)   Collection Time: 08/22/17  4:11 PM  Result Value Ref Range Status   Specimen Description BLOOD HEMODIALYSIS FISTULA  Final   Special Requests   Final    BOTTLES DRAWN AEROBIC AND ANAEROBIC Blood Culture results may not be optimal due to an excessive volume of blood received in culture bottles   Culture   Final    NO  GROWTH 3 DAYS Performed at [facility], [address] [place], [place] [address]    Report Status PENDING  Incomplete     Studies: Ct Angio Chest Pe W Or Wo Contrast  Result Date: 08/25/2017 CLINICAL DATA:  Acute hypoxic respiratory failure. Shortness of  breath. EXAM: CT ANGIOGRAPHY CHEST WITH CONTRAST TECHNIQUE: Multidetector CT imaging of the chest was performed using the standard protocol during bolus administration of intravenous contrast. Multiplanar CT image reconstructions and MIPs were obtained to evaluate the vascular anatomy. CONTRAST:  75mL ISOVUE-370 IOPAMIDOL (ISOVUE-370) INJECTION 76% COMPARISON:  Chest radiograph 08/21/2017 FINDINGS: Cardiovascular: Moderately good opacification of central and segmental pulmonary arteries. No focal filling defects are identified. No evidence of significant pulmonary embolus. Mild cardiac enlargement. No pericardial effusions. Coronary artery calcifications. Normal caliber thoracic aorta with scattered calcification. Mediastinum/Nodes: Esophagus is decompressed. Moderately prominent lymph nodes throughout the upper mediastinum. Pretracheal lymph nodes measure up to 2.2 cm diameter. Changes are nonspecific in could represent reactive lymph nodes, metastatic nodes, or lymphoproliferative changes. Lungs/Pleura: Motion artifact limits evaluation. Small to moderate bilateral pleural effusions with basilar atelectasis. Hazy mosaic appearance to the lung parenchyma may represent motion artifact, edema, or air trapping. No pneumothorax. Airways are patent. Upper Abdomen: No acute abnormality. Musculoskeletal: Degenerative changes in the spine. No destructive bone lesions. Review of the MIP images confirms the above findings. IMPRESSION: 1. No evidence of significant pulmonary embolus. 2. Probable congestive changes with cardiac enlargement, bilateral pleural effusions, and mosaic attenuation pattern to the lungs which may indicate edema or motion  artifact. 3. Mediastinal lymphadenopathy of nonspecific etiology. 4. Atelectasis in the lung bases. 5. Technically limited examination due to motion artifact. Aortic Atherosclerosis (ICD10-I70.0). Electronically Signed   By: Burman Nieves M.D.   On: 08/25/2017 06:57    Scheduled Meds: . aspirin  81 mg Oral Daily  . atorvastatin  40 mg Oral q1800  . bisacodyl  10 mg Rectal Once  . clopidogrel  75 mg Oral Q1200  . docusate sodium  100 mg Oral BID  . epoetin (EPOGEN/PROCRIT) injection  4,000 Units Intravenous Q M,W,F-HD  . escitalopram  2.5 mg Oral Daily  . fluticasone  1 spray Each Nare Daily  . gabapentin  300 mg Oral Daily  . heparin  5,000 Units Subcutaneous Q8H  . insulin aspart  0-9 Units Subcutaneous Q4H  . lactulose  10 g Oral q1800  . levETIRAcetam  1,500 mg Oral q1800  . lidocaine  1 patch Transdermal QHS  . mouth rinse  15 mL Mouth Rinse BID  . Melatonin  5 mg Oral QHS  . metoprolol tartrate  50 mg Oral BID  . multivitamin with minerals  1 tablet Oral Daily  . OXcarbazepine  150 mg Oral QHS  . pantoprazole  40 mg Oral Daily  . polyethylene glycol  17 g Oral Q1200  . polyvinyl alcohol  1 drop Both Eyes TID  . sevelamer carbonate  800 mg Oral TID AC & HS   Continuous Infusions:   Assessment/Plan:  1. Acute metabolic encephalopathy.  Unclear etiology but patient has improved since she came in.  MRI of the brain was negative for stroke. 2. Possible anxiety attack at dialysis today with hypertension.  Patient given nitroglycerin and put on 100% nonrebreather.  Will get an EKG.  Continue to monitor overnight. 3. Acute hypoxic respiratory failure.  Patient was tapered down to 0.5 L of oxygen.  I did give a dose of Lasix yesterday to get rid of some fluid.  Dialysis to remove fluid.  CT scan of the chest was negative for pulmonary embolism did show fluid and some lymphadenopathy. 4. Lymphadenopathy in the chest.  Refer to heme-onc as outpatient. 5. end-stage renal disease.   Hemodialysis finished just now 6. Hyperkalemia on presentation. 7. Type 2  diabetes mellitus on sliding scale insulin 8. History of seizure on Keppra 9. Hyperlipidemia unspecified on atorvastatin 10. GERD on Protonix 11. Obesity weight loss needed  Code Status:     Code Status Orders  (From admission, onward)        Start     Ordered   08/22/17 0212  Full code  Continuous     08/22/17 0211    Code Status History    Date Active Date Inactive Code Status Order ID Comments User Context   07/01/2017 1654 07/03/2017 0404 Full Code [id number]  Enedina Finner, MD Inpatient   05/15/2017 0540 05/21/2017 1406 Full Code [id number]  Ihor Austin, MD Inpatient   05/31/2016 1606 06/11/2016 2013 Full Code [id number]  Kathlene Cote, PA-C ED   03/13/2016 2236 03/16/2016 2033 Full Code [id number]  Oralia Manis, MD ED   02/25/2016 1758 02/27/2016 2332 Full Code [id number]  Shaune Pollack, MD Inpatient   01/19/2016 0454 01/20/2016 2103 Full Code [id number]  Hillary Bow, DO ED   12/25/2015 2001 01/04/2016 2024 Full Code [id number]  Houston Siren, MD Inpatient   11/19/2015 0117 11/21/2015 1956 Full Code [id number]  Hugelmeyer, Alexis, DO Inpatient   10/06/2015 0208 10/06/2015 1832 Full Code [id number]  Ihor Austin, MD Inpatient   09/13/2015 0214 09/15/2015 1728 Full Code [id number]  Arnaldo Natal, MD Inpatient   08/24/2015 0350 08/31/2015 1847 Full Code [id number]  Ihor Austin, MD Inpatient   07/16/2015 0626 08/02/2015 1848 Full Code [id number]  Ihor Austin, MD ED   07/10/2015 0149 07/12/2015 1301 Full Code [id number]  Oralia Manis, MD Inpatient   05/12/2015 1546 05/16/2015 1735 Full Code [id number]  Auburn Bilberry, MD ED     Family Communication: Spoke with daughter on the phone Disposition Plan: Potential back to facility tomorrow.  Consultants:  Critical care specialist  Neurology  Time spent: 26 minutes  Darrill Vreeland [employer]

## 2017-08-25 NOTE — Progress Notes (Signed)
PHARMACIST - PHYSICIAN COMMUNICATION  CONCERNING: IV to Oral Route Change Policy  RECOMMENDATION: This patient is receiving Keppra by the intravenous route.  Based on criteria approved by the Pharmacy and Therapeutics Committee, the intravenous medication(s) is/are being converted to the equivalent oral dose form(s).   DESCRIPTION: These criteria include:  The patient is eating (either orally or via tube) and/or has been taking other orally administered medications for a least 24 hours  The patient has no evidence of active gastrointestinal bleeding or impaired GI absorption (gastrectomy, short bowel, patient on TNA or NPO).  If you have questions about this conversion, please contact the Pharmacy Department  []   [phone] )  [facility] [x]   [phone] )  [facility] []   [phone] )  [facility] []   [phone] )  [facility] []   [phone] )  [facility]   Simpson,Dinh L, [facility] 08/25/2017 9:30 AM

## 2017-08-25 NOTE — Progress Notes (Signed)
Pt has multiple allergies to antibiotics. I spoke to pharmacist to double check that the azactam that is ordered is not something that she has a known allergy to. He stated that it is not considered a cross reactive to anything else on her allergy list.

## 2017-08-25 NOTE — Progress Notes (Signed)
ANTICOAGULATION CONSULT NOTE - Initial Consult  Pharmacy Consult for heparin infusion Indication: chest pain/ACS  Allergies  Allergen Reactions  . Cephalosporins Anaphylaxis    Patient has tolerated meropenem.   [name] Penicillins Anaphylaxis and Other (See Comments)    Has patient had a PCN reaction causing immediate rash, facial/tongue/throat swelling, SOB or lightheadedness with hypotension: Yes Has patient had a PCN reaction causing severe rash involving mucus membranes or skin necrosis: No Has patient had a PCN reaction that required hospitalization No Has patient had a PCN reaction occurring within the last 10 years: No If all of the above answers are "NO", then may proceed with Cephalosporin use.  . Reglan [Metoclopramide] Other (See Comments)    Severe EPS (lip, head, body tremors) March 2018  . Risperdal [Risperidone] Other (See Comments)    Severe EPS (lip, head, body tremors) March 2018  . Lamictal [Lamotrigine] Other (See Comments)    Reaction:  Hallucinations  . Phenergan [Promethazine Hcl] Nausea And Vomiting    Sensitivity to medicine  . Pravastatin Other (See Comments)    Reaction:  Muscle pain   . Sulfa Antibiotics Other (See Comments)    Reaction:  Unknown     Patient Measurements: Height: 5\' 9"  (175.3 cm) Weight: 244 lb 0.8 oz (110.7 kg) IBW/kg (Calculated) : 66.2 Heparin Dosing Weight: 91.1 kg  Vital Signs: Temp: 99.1 F (37.3 C) (05/27 1659) Temp Source: Axillary (05/27 1659) BP: 196/77 (05/27 1745) Pulse Rate: 86 (05/27 1745)  Labs: Recent Labs    08/23/17 0522 08/25/17 1601  CREATININE 4.46*  --   TROPONINI  --  3.02*    Estimated Creatinine Clearance: 19.3 mL/min (A) (by C-G formula based on SCr of 4.46 mg/dL (H)).   Medical History: Past Medical History:  Diagnosis Date  . Anginal pain (HCC)   . Anxiety   . Bipolar disorder (HCC)   . CAD (coronary artery disease)   . CHF (congestive heart failure) (HCC)   . Chronic lower back pain   .  Depression   . Endometriosis   . ESRD (end stage renal disease) on dialysis (HCC)    "[facility]; [address]; [place]; TTS" (01/19/2016)  . Gastroparesis   . GERD (gastroesophageal reflux disease)   . History of blood transfusion "several"   "my blood would get low; low RBC"  . History of hiatal hernia   . HLD (hyperlipidemia)   . Hypertension   . Migraine    "monthly" (01/19/2016)  . Myocardial infarction (HCC) 2017   "~ 3 wks ago" (01/19/2016)  . Renal disorder   . Renal insufficiency   . Seizures (HCC) 07/2015   "I've only had the 1; don't know what from" (01/19/2016)  . Stroke (HCC)   . Type II diabetes mellitus (HCC)     Medications:  Infusions:  . aztreonam    . heparin    . nitroGLYCERIN 15 mcg/min (08/25/17 1759)    Assessment: 69 yof with extensive PMH including CAD s/p PCI with 2 stents 10 years ago, CHF, ESRD on HD, PVD, right BKA, HTN, DM, and seizure disorder. Had CP at end of HD session today, initial troponin 3.02. Pharmacy consulted to dose heparin for ACS. No PTA OAC noted. APTT not evaluated since 5/21. Will check baseline aPTT and PT/INR. Last CBC 5/24, will check that, too. Patient received several doses of LDUH - will not use initial UFH bolus.  Goal of Therapy:  Heparin level 0.3-0.7 units/ml Monitor platelets by anticoagulation protocol: Yes  Plan:  Start heparin infusion at 1250 units/hr Check anti-Xa level in 8 hours and daily while on heparin Continue to monitor H&H and platelets  Carola Frost, Pharm.D., BCPS Clinical Pharmacist 08/25/2017,6:01 PM

## 2017-08-25 NOTE — Progress Notes (Signed)
Pt refused 3rd round of nitro r/t being afraid of having a rapid response called. She continued to have chest pain and wanted to hold off on nitro to see if anti-anxiety meds would help, RN/MD aware. RN came down to administer PO meds, MD was called to get the orders changed to IV. RN took pt up to the unit to be assessed by MD. Dr. Cherylann Ratel aware.    08/25/17 1505  Hand-Off documentation  Report given to (Full Name) [facility]  Report received from (Full Name) Granville Lewis  Vital Signs  Pulse Rate 96  Pulse Rate Source Monitor  Resp (!) 35  BP (!) 220/89  Oxygen Therapy  SpO2 100 %

## 2017-08-25 NOTE — Progress Notes (Signed)
Post HD assessment    08/25/17 1448  Neurological  Level of Consciousness Alert  Orientation Level Oriented X4  Respiratory  Respiratory Pattern Regular;Unlabored  Chest Assessment Chest expansion symmetrical  Cardiac  ECG Monitor Yes  Vascular  R Radial Pulse +2  L Radial Pulse +2  Edema Generalized;Left lower extremity  Integumentary  Integumentary (WDL) X  Skin Color Appropriate for ethnicity  Musculoskeletal  Musculoskeletal (WDL) X  Generalized Weakness Yes  Assistive Device None  GU Assessment  Genitourinary (WDL) X  Genitourinary Symptoms  (HD)  Psychosocial  Psychosocial (WDL) WDL

## 2017-08-25 NOTE — Progress Notes (Signed)
Pharmacist - Prescriber Communication  Aztreonam dose modified from 1 gm IM Q8H to 0.5 gm IM Q8H due to ESRD HD.   Kimberly Montgomery A. [place], [place].D., BCPS Clinical Pharmacist 08/25/17 17:35

## 2017-08-25 NOTE — Progress Notes (Signed)
HD tx end   08/25/17 1425  Vital Signs  Pulse Rate 93  Pulse Rate Source Monitor  Resp (!) 23  BP (!) 206/89  BP Location Left Wrist  BP Method Automatic  Patient Position (if appropriate) Lying  Oxygen Therapy  SpO2 100 %  O2 Device Non-rebreather Mask  During Hemodialysis Assessment  Dialysis Fluid Bolus Normal Saline  Bolus Amount (mL) 250 mL  Intra-Hemodialysis Comments Tx completed

## 2017-08-25 NOTE — Progress Notes (Signed)
Report called to sarah in ccu  Pt transported to ccu 11 at this time. enroute lab called a troponin level of 3.02. Reported to sarah of this  And told her dr Renae Gloss had not been called results yet  D/t on way up to ccu with pt. resp assisted in transport.

## 2017-08-25 NOTE — Progress Notes (Signed)
Pt is on bipap and has orders for medications to be administered PO. I spoke to Dr. Duanne Limerick about if pt could still receive these medications PO or if she would require PO meds to be held/switched to IV route. He stated it would be ok to administer medications PO as ordered.

## 2017-08-25 NOTE — Progress Notes (Signed)
Post HD assessment. Pt tolerated tx well without complication. At the end of tx, pt c/o chest pain and sob. Pt initially c/o sob r/t anxiety. Her 02 was increased to 2L. This did not help so pt was put on a non-rebreather, which seemed to help. Pt then c/o chest pain that radiated down her left arm. Pt was given Nitrostat X2, once at 1425 and again at 1433. Pt stated that she felt relief and did not want another nitro, but a few minutes later requested another, MD aware. RN notified and on her way with anxiety meds.Net UF 1181, goal met.    08/25/17 1450  Vital Signs  Temp 97.9 F (36.6 C)  Temp Source Oral  Pulse Rate 94  Pulse Rate Source Monitor  Resp (!) 23  BP (!) 203/84  BP Location Left Wrist  BP Method Automatic  Patient Position (if appropriate) Lying  Oxygen Therapy  SpO2 100 %  O2 Device Non-rebreather Mask  Dialysis Weight  Weight 111 kg (244 lb 11.4 oz)  Type of Weight Post-Dialysis  Post-Hemodialysis Assessment  Rinseback Volume (mL) 250 mL  KECN 93.6 V  Dialyzer Clearance Lightly streaked  Duration of HD Treatment -hour(s) 3.5 hour(s)  Hemodialysis Intake (mL) 500 mL  UF Total -Machine (mL) 1681 mL  Net UF (mL) 1181 mL  Tolerated HD Treatment Yes  AVG/AVF Arterial Site Held (minutes) 10 minutes  AVG/AVF Venous Site Held (minutes) 10 minutes  Education / Care Plan  Dialysis Education Provided Yes  Documented Education in Care Plan Yes  Fistula / Graft Right Upper arm Arteriovenous fistula  Placement Date/Time: 08/23/16 0915   Orientation: Right  Access Location: Upper arm  Access Type: Arteriovenous fistula  Site Condition No complications  Fistula / Graft Assessment Present;Thrill;Bruit  Status Deaccessed  Drainage Description None

## 2017-08-25 NOTE — Progress Notes (Addendum)
Pt transferred to unit from 1C for respiratory distress and CP. Pt is on bipap and is alert and oriented x 4. She continues to have CP 5/10 and describes it as "crushing". Dr. Duanne Limerick aware. Pt's Bp remains elevated. Will administer IV hydralazine while awaiting additional orders from Dr. Duanne Limerick.   Update 1901: Pt asked that her daughter [name]) be notified of her transfer to ICU. [name] was called and stated that she had already been update by Dr. Renae Gloss.

## 2017-08-25 NOTE — Consult Note (Signed)
Name: Kimberly Montgomery MRN: [id number] DOB: 08/28/1965     CONSULTATION DATE: 08/21/2017 52 years old lady with history of bipolar disorder, anxiety, coronary artery disease status post PCI for 2 stents 10 years ago, congestive heart failure, end-stage renal disease on dialysis, peripheral vascular disease status post right below-knee amputation, GERD, hiatal hernia, hypertension, dyslipidemia, congestive heart failure, diabetes mellitus and seizure disorder. Patient had episode of respiratory distress retrosternal chest pain radiating to the left upper extremity at the end of her dialysis session.  Primary care provider requested transfer to the intensive care unit because of increased FiO2 requirement to nonrebreather 100% had received 80 mg of Lasix and Solu-Medrol. Patient arrived to the intensive care unit awake, in no distress, on BiPAP, complaining of retrosternal chest pain radiating to the left upper extremity with systolic blood pressure 201.  PAST MEDICAL HISTORY :   has a past medical history of Anginal pain (HCC), Anxiety, Bipolar disorder (HCC), CAD (coronary artery disease), CHF (congestive heart failure) (HCC), Chronic lower back pain, Depression, Endometriosis, ESRD (end stage renal disease) on dialysis (HCC), Gastroparesis, GERD (gastroesophageal reflux disease), History of blood transfusion ("several"), History of hiatal hernia, HLD (hyperlipidemia), Hypertension, Migraine, Myocardial infarction (HCC) (2017), Renal disorder, Renal insufficiency, Seizures (HCC) (07/2015), Stroke (HCC), and Type II diabetes mellitus (HCC).  has a past surgical history that includes Hysterotomy; Below knee leg amputation (Right, 2010?); Cardiac catheterization (N/A, 08/30/2015); Cardiac catheterization (N/A, 01/01/2016); Abdominal hysterectomy; Tubal ligation; Peritoneal catheter removal (01/01/2016); Salpingoophorectomy (Right); Cardiac catheterization (Right, 07/10/2015); Cardiac catheterization (Right,  09/14/2015); Cardiac catheterization (N/A, 09/14/2015); Cardiac catheterization (Right, 11/20/2015); Coronary angioplasty with stent (<2017); Peritoneal catheter insertion (11/03/2013); Peritoneal catheter removal (11/03/2013; 02/11/2014); TEE without cardioversion (N/A, 06/07/2016); Cataract extraction, bilateral; Coronary angioplasty with stent; AV fistula placement (Right, 08/23/2016); and DIALYSIS/PERMA CATHETER REMOVAL (N/A, 11/12/2016). Prior to Admission medications   Medication Sig Start Date End Date Taking? Authorizing Provider  acetaminophen (TYLENOL) 500 MG tablet Take 500 mg by mouth every 6 (six) hours as needed for moderate pain or fever.   Yes [provider]  albuterol (PROVENTIL) (2.5 MG/3ML) 0.083% nebulizer solution Take 2.5 mg by nebulization every 4 (four) hours as needed for wheezing or shortness of breath.   Yes [provider]  amLODipine (NORVASC) 10 MG tablet Take 10 mg by mouth daily.    Yes [provider]  aspirin 81 MG chewable tablet Chew 81 mg by mouth daily.   Yes [provider]  atorvastatin (LIPITOR) 40 MG tablet Take 40 mg by mouth daily at 6 PM.   Yes [provider]  benztropine (COGENTIN) 0.5 MG tablet Take 0.5 mg by mouth 2 (two) times daily.   Yes [provider]  bisacodyl (DULCOLAX) 10 MG suppository Place 10 mg rectally as needed for moderate constipation.   Yes [provider]  carboxymethylcellulose (REFRESH) 1 % ophthalmic solution Place 1 drop into both eyes 3 (three) times daily.   Yes [provider]  clonazePAM (KLONOPIN) 0.5 MG tablet Take  tablet (0.25MG ) by mouth every morning and 1 tablet (0.5MG ) by mouth every evening   Yes [provider]  clopidogrel (PLAVIX) 75 MG tablet Take 75 mg by mouth daily at 12 noon.    Yes [provider]  escitalopram (LEXAPRO) 5 MG tablet Take 2.5 mg by mouth daily.   Yes [provider]  fluticasone (FLONASE) 50 MCG/ACT  nasal spray Place 1 spray into both nostrils daily.    Yes [provider]  gabapentin (NEURONTIN) 300 MG capsule Take 300 mg by mouth daily.    Yes [provider]  hydrALAZINE (APRESOLINE) 50 MG tablet Take 1 tablet (50MG ) by mouth three times daily (Sunday, Tuesday, Thursday & Saturday) and take 1 tablet (50MG ) by mouth twice daily (Monday, Wednesday & Friday).   Yes [provider]  HYDROcodone-acetaminophen (NORCO/VICODIN) 5-325 MG tablet Take 1 tablet by mouth every 6 (six) hours as needed for moderate pain or severe pain. 05/20/17  Yes Delfino Lovett, MD  insulin aspart (NOVOLOG) 100 UNIT/ML injection Inject 7 Units into the skin 3 (three) times daily with meals.    Yes [provider]  insulin detemir (LEVEMIR) 100 UNIT/ML injection Inject 4 Units into the skin daily.    Yes [provider]  isosorbide mononitrate (IMDUR) 30 MG 24 hr tablet Take 90 mg by mouth daily at 12 noon.    Yes [provider]  lactulose (CHRONULAC) 10 GM/15ML solution Take 10 g by mouth daily at 6 PM.    Yes [provider]  levETIRAcetam (KEPPRA) 750 MG tablet Take 2 tablets (1,500 mg total) by mouth daily at 12 noon. 05/20/17  Yes Delfino Lovett, MD  lidocaine (LIDODERM) 5 % Place 1 patch onto the skin at bedtime.    Yes [provider]  lidocaine (LMX) 4 % cream Apply 1 application topically daily as needed (pain). To right arm fistula   Yes [provider]  linagliptin (TRADJENTA) 5 MG TABS tablet Take 5 mg by mouth daily.   Yes [provider]  lisinopril (PRINIVIL,ZESTRIL) 10 MG tablet Take 10 mg by mouth daily at 12 noon.   Yes [provider]  Melatonin 3 MG TABS Take 3 mg by mouth at bedtime.   Yes [provider]  metoprolol (LOPRESSOR) 50 MG tablet Take 1 tablet (50 mg total) by mouth 2 (two) times daily. 09/15/15  Yes Sudini, Wardell Heath, MD  multivitamin (ONE-A-DAY MEN'S) TABS tablet Take 1 tablet by mouth daily.    Yes [provider]  nitroGLYCERIN (NITROSTAT) 0.4 MG SL tablet Place 0.4 mg under the tongue every 5 (five) minutes as needed for chest pain. Reported on 07/10/2015   Yes [provider]  omeprazole (PRILOSEC) 20 MG capsule Take 20 mg by mouth 2 (two) times daily.    Yes [provider]  ondansetron (ZOFRAN ODT) 4 MG disintegrating tablet Take 1 tablet (4 mg total) by mouth every 6 (six) hours as needed for nausea or vomiting. 07/16/15  Yes Sharyn Creamer, MD  OXcarbazepine (TRILEPTAL) 150 MG tablet Take 150 mg by mouth at bedtime.   Yes [provider]  polyethylene glycol (MIRALAX / GLYCOLAX) packet Take 17 g by mouth daily. Patient taking differently: Take 17 g by mouth daily at 12 noon.  08/02/15  Yes Adrian Saran, MD  senna (SENOKOT) 8.6 MG TABS tablet Take 2 tablets by mouth 2 (two) times daily.    Yes [provider]  sevelamer carbonate (RENVELA) 800 MG tablet Take 800 mg by mouth 4 (four) times daily -  before meals and at bedtime.    Yes [provider]  torsemide (DEMADEX) 100 MG tablet Take 100 mg by mouth daily.    Yes [provider]   Allergies  Allergen Reactions  . Cephalosporins Anaphylaxis    Patient has tolerated meropenem.   [name] Penicillins Anaphylaxis and Other (See Comments)    Has patient had a PCN reaction causing immediate rash, facial/tongue/throat swelling, SOB or lightheadedness  with hypotension: Yes Has patient had a PCN reaction causing severe rash involving mucus membranes or skin necrosis: No Has patient had a PCN reaction that required hospitalization No Has patient had a PCN reaction occurring within the last 10 years: No If all of the above answers are "NO", then may proceed with Cephalosporin use.  . Reglan [Metoclopramide] Other (See Comments)    Severe EPS (lip, head, body tremors) March 2018  . Risperdal [Risperidone] Other (See Comments)    Severe EPS (lip, head, body tremors) March 2018  .  Lamictal [Lamotrigine] Other (See Comments)    Reaction:  Hallucinations  . Phenergan [Promethazine Hcl] Nausea And Vomiting    Sensitivity to medicine  . Pravastatin Other (See Comments)    Reaction:  Muscle pain   . Sulfa Antibiotics Other (See Comments)    Reaction:  Unknown     FAMILY HISTORY:  family history includes Bipolar disorder in her unknown relative; CAD in her unknown relative; Cervical cancer in her mother; Diabetes in her unknown relative; Other in her father. SOCIAL HISTORY:  reports that she quit smoking about 2 years ago. Her smoking use included cigarettes. She smoked 0.75 packs per day. She has never used smokeless tobacco. She reports that she does not drink alcohol or use drugs.  REVIEW OF SYSTEMS:   Unable to obtain due to critical illness   VITAL SIGNS: Temp:  [97.9 F (36.6 C)-99.1 F (37.3 C)] 99.1 F (37.3 C) (05/27 1659) Pulse Rate:  [65-100] 100 (05/27 1659) Resp:  [17-37] 21 (05/27 1659) BP: (146-220)/(57-95) 206/80 (05/27 1659) SpO2:  [84 %-100 %] 100 % (05/27 1659) FiO2 (%):  [50 %-100 %] 50 % (05/27 1659) Weight:  [110.7 kg (244 lb)-111.6 kg (246 lb 0.5 oz)] 110.7 kg (244 lb 0.8 oz) (05/27 1659)  Physical Examination:  Awake and oriented with no focal neurological deficits On BiPAP, no distress, able to talk in full sentences, bilateral equal air entry and no adventitious sounds S1 & S2 are audible with no murmur Benign obese abdomen was normal peristalsis Wasted extremities no edema on the right below-knee amputation    ASSESSMENT / PLAN: Acute respiratory failure tolerating BiPAP. -Monitor ABG and optimize BiPAP settings  NSTEMI was typical chest pain elevated cardiac enzymes. TEE 05/2017, LVEF 55-60% and no wall motion abnormality. History of CAD status post PCI and 2 stents 10 years ago as per patient -Heparin gtt + ASA + Plavix + Statin + BB. -Monitor CI, ECHO and follow with cardiology consult.  Hypertensive emergency with acute  coronary syndrome -Optimize antihypertensives and monitor hemodynamics  Atelectasis and possible pneumonia with infective etiology can not be ruled out -Started on Zyvox + Azactam. Monitor CXR + CBC + FIO2.  Non specific incidental mediastinal adenopathy.  -Follow up CT in 3 weeks and consider pulmonary consult.  End-stage renal disease on HD as per renal  Enterococcus urinary tract infection on Zyvox with history of penicillin allergy  Seizure disorder on Keppra  GERD on Protonix  Dyslipidemia on atorvastatin  Anemia -Keep HB . 8 gm/dl.  Full code  DVT & GI prophylaxis.  Continue with supportive care  Critical care time 45 minutes

## 2017-08-25 NOTE — Progress Notes (Signed)
CRITICAL VALUE ALERT  Critical Value:  Troponin 3.02  Date & Time Notied:  08/25/17, 1645   Provider Notified: Dr. Duanne Limerick  Orders Received/Actions taken: He stated he would place orders once he rounded on pt  Update: Pt started on IV nitroglycerin and IV heparin drip

## 2017-08-25 NOTE — Progress Notes (Signed)
Pre HD assessment    08/25/17 1000  Neurological  Level of Consciousness Alert  Orientation Level Oriented to person;Oriented to place;Oriented to situation  Respiratory  Respiratory Pattern Regular;Unlabored  Chest Assessment Chest expansion symmetrical  Bilateral Breath Sounds Diminished;Expiratory wheezes  Cardiac  ECG Monitor Yes  Vascular  R Radial Pulse +2  L Radial Pulse +2  Edema Generalized;Left lower extremity  Integumentary  Integumentary (WDL) X  Skin Color Appropriate for ethnicity  Musculoskeletal  Musculoskeletal (WDL) X  Generalized Weakness Yes  Assistive Device None  GU Assessment  Genitourinary (WDL) X  Genitourinary Symptoms  (HD)  Psychosocial  Psychosocial (WDL) WDL  Emotional support given Given to patient

## 2017-08-25 NOTE — Progress Notes (Signed)
HD tx start    08/25/17 1012  Vital Signs  Pulse Rate 69  Pulse Rate Source Monitor  Resp 19  BP (!) 156/63  BP Location Left Arm  BP Method Automatic  Patient Position (if appropriate) Lying  Oxygen Therapy  SpO2 98 %  O2 Device Nasal Cannula  O2 Flow Rate (L/min) 0.5 L/min  During Hemodialysis Assessment  Blood Flow Rate (mL/min) 400 mL/min  Arterial Pressure (mmHg) -130 mmHg  Venous Pressure (mmHg) 220 mmHg  Transmembrane Pressure (mmHg) 70 mmHg  Ultrafiltration Rate (mL/min) 430 mL/min  Dialysate Flow Rate (mL/min) 800 ml/min  Conductivity: Machine  14.1  HD Safety Checks Performed Yes  Dialysis Fluid Bolus Normal Saline  Bolus Amount (mL) 250 mL  Intra-Hemodialysis Comments Tx initiated  Fistula / Graft Right Upper arm Arteriovenous fistula  Placement Date/Time: 08/23/16 0915   Orientation: Right  Access Location: Upper arm  Access Type: Arteriovenous fistula  Status Accessed  Needle Size 15

## 2017-08-25 NOTE — Progress Notes (Addendum)
Patient ID: Kimberly Montgomery, female   DOB: [birth year], 52 y.o.   MRN: [id number]    [facility] PROGRESS NOTE  Kimberly Montgomery HYQ:[id number] DOB: Aug 27, 1965 DOA: 08/21/2017 PCP: Dorothey Baseman, MD  HPI/Subjective: Called to see the patient after dialysis secondary to respiratory distress shortness of breath and chest pain.  Patient on 100% nonrebreather.  Objective: Vitals:   08/25/17 1517 08/25/17 1524  BP: (!) 146/95 (!) 191/88  Pulse: 99 97  Resp: (!) 26   Temp:    SpO2: 100%     Filed Weights   08/25/17 0425 08/25/17 0958 08/25/17 1450  Weight: 110.7 kg (244 lb) 111.6 kg (246 lb 0.5 oz) 111 kg (244 lb 11.4 oz)    ROS: Review of Systems  Constitutional: Negative for chills and fever.  Eyes: Negative for blurred vision.  Respiratory: Positive for cough and shortness of breath.   Cardiovascular: Positive for chest pain.  Gastrointestinal: Negative for abdominal pain, constipation, diarrhea, nausea and vomiting.  Genitourinary: Negative for dysuria.  Musculoskeletal: Negative for joint pain.  Neurological: Negative for dizziness and headaches.   Exam: Physical Exam  Constitutional: She is oriented to person, place, and time.  HENT:  Nose: No mucosal edema.  Mouth/Throat: No oropharyngeal exudate or posterior oropharyngeal edema.  Eyes: Pupils are equal, round, and reactive to light. Conjunctivae and lids are normal.  Neck: Carotid bruit is not present.  Cardiovascular: Regular rhythm, S1 normal, S2 normal and normal heart sounds.  Pulses:      Dorsalis pedis pulses are 2+ on the left side.  Respiratory: Accessory muscle usage present. She has decreased breath sounds in the right middle field, the right lower field, the left middle field and the left lower field. She has wheezes in the right middle field, the right lower field, the left middle field and the left lower field. She has no rhonchi. She has no rales.  GI: Soft. Bowel sounds are normal. There is no  tenderness.  Musculoskeletal:       Right knee: She exhibits no swelling.       Left ankle: She exhibits swelling.  Lymphadenopathy:    She has no cervical adenopathy.  Neurological: She is alert and oriented to person, place, and time.  Skin: Skin is warm. No rash noted. Nails show no clubbing.  Psychiatric: She has a normal mood and affect.      Data Reviewed: Basic Metabolic Panel: Recent Labs  Lab 08/21/17 2236 08/22/17 0633 08/22/17 1413 08/23/17 0522  NA 131* 129*  --  136  K 6.2* 5.9*  --  4.3  CL 93* 93*  --  96*  CO2 26 26  --  28  GLUCOSE 90 99  --  100*  BUN 37* 40*  --  19  CREATININE 6.39* 6.83*  --  4.46*  CALCIUM 9.1 8.6*  --  8.5*  MG  --  2.3  --  1.8  PHOS  --  5.9* 5.6* 4.7*   Liver Function Tests: Recent Labs  Lab 08/21/17 2236 08/22/17 0633 08/23/17 0522  AST 15  --   --   ALT 9*  --   --   ALKPHOS 151*  --   --   BILITOT 0.5  --   --   PROT 7.5  --   --   ALBUMIN 3.7 3.3* 3.3*    Recent Labs  Lab 08/21/17 2201 08/22/17 0633  AMMONIA 35 26   CBC: Recent Labs  Lab 08/21/17 2236  08/22/17 0633  WBC 6.0 4.8  HGB 12.4 11.6*  HCT 38.4 34.8*  MCV 88.5 87.5  PLT 260 200   Cardiac Enzymes: Recent Labs  Lab 08/21/17 2236  TROPONINI <0.03   BNP (last 3 results) Recent Labs    08/21/17 2236  BNP 1,509.0*     CBG: Recent Labs  Lab 08/24/17 1632 08/24/17 1957 08/25/17 0004 08/25/17 0400 08/25/17 0743  GLUCAP 112* 135* 168* 145* 100*    Recent Results (from the past 240 hour(s))  MRSA PCR Screening     Status: None   Collection Time: 08/22/17  2:41 AM  Result Value Ref Range Status   MRSA by PCR NEGATIVE NEGATIVE Final    Comment:        The GeneXpert MRSA Assay (FDA approved for NASAL specimens only), is one component of a comprehensive MRSA colonization surveillance program. It is not intended to diagnose MRSA infection nor to guide or monitor treatment for MRSA infections. Performed at [facility], [address] [place], [place] [address]   Urine Culture     Status: Abnormal   Collection Time: 08/22/17 11:30 AM  Result Value Ref Range Status   Specimen Description   Final    URINE, RANDOM Performed at [facility], [address] [place], [place] [address]    Special Requests   Final    NONE Performed at [facility], [address] [place], [place] [address]    Culture >=100,000 COLONIES/mL ENTEROCOCCUS GALLINARUM (A)  Final   Report Status 08/25/2017 FINAL  Final   Organism ID, Bacteria ENTEROCOCCUS GALLINARUM (A)  Final      Susceptibility   Enterococcus gallinarum - MIC*    AMPICILLIN <=2 SENSITIVE Sensitive     LEVOFLOXACIN >=8 RESISTANT Resistant     NITROFURANTOIN <=16 SENSITIVE Sensitive     VANCOMYCIN RESISTANT Resistant     LINEZOLID 2 SENSITIVE Sensitive     * >=100,000 COLONIES/mL ENTEROCOCCUS GALLINARUM  Culture, blood (routine x 2)     Status: None (Preliminary result)   Collection Time: 08/22/17  3:01 PM  Result Value Ref Range Status   Specimen Description BLOOD HEMODIALYSIS FISTULA  Final   Special Requests   Final    BOTTLES DRAWN AEROBIC AND ANAEROBIC Blood Culture results may not be optimal due to an excessive volume of blood received in culture bottles   Culture   Final    NO GROWTH 3 DAYS Performed at [facility], [address] [place], [place] [address]    Report Status PENDING  Incomplete  Culture, blood (routine x 2)     Status: None (Preliminary result)   Collection Time: 08/22/17  4:11 PM  Result Value Ref Range Status   Specimen Description BLOOD HEMODIALYSIS FISTULA  Final   Special Requests   Final    BOTTLES DRAWN AEROBIC AND ANAEROBIC Blood Culture results may not be optimal due to an excessive volume of blood received in culture bottles   Culture   Final    NO GROWTH 3 DAYS Performed at [facility], [address] [place], [place] [address]    Report Status PENDING  Incomplete      Studies: Ct Angio Chest Pe W Or Wo Contrast  Result Date: 08/25/2017 CLINICAL DATA:  Acute hypoxic respiratory failure. Shortness of breath. EXAM: CT ANGIOGRAPHY CHEST WITH CONTRAST TECHNIQUE: Multidetector CT imaging of the chest was performed using the standard protocol during bolus administration of intravenous contrast. Multiplanar CT  image reconstructions and MIPs were obtained to evaluate the vascular anatomy. CONTRAST:  80mL ISOVUE-370 IOPAMIDOL (ISOVUE-370) INJECTION 76% COMPARISON:  Chest radiograph 08/21/2017 FINDINGS: Cardiovascular: Moderately good opacification of central and segmental pulmonary arteries. No focal filling defects are identified. No evidence of significant pulmonary embolus. Mild cardiac enlargement. No pericardial effusions. Coronary artery calcifications. Normal caliber thoracic aorta with scattered calcification. Mediastinum/Nodes: Esophagus is decompressed. Moderately prominent lymph nodes throughout the upper mediastinum. Pretracheal lymph nodes measure up to 2.2 cm diameter. Changes are nonspecific in could represent reactive lymph nodes, metastatic nodes, or lymphoproliferative changes. Lungs/Pleura: Motion artifact limits evaluation. Small to moderate bilateral pleural effusions with basilar atelectasis. Hazy mosaic appearance to the lung parenchyma may represent motion artifact, edema, or air trapping. No pneumothorax. Airways are patent. Upper Abdomen: No acute abnormality. Musculoskeletal: Degenerative changes in the spine. No destructive bone lesions. Review of the MIP images confirms the above findings. IMPRESSION: 1. No evidence of significant pulmonary embolus. 2. Probable congestive changes with cardiac enlargement, bilateral pleural effusions, and mosaic attenuation pattern to the lungs which may indicate edema or motion artifact. 3. Mediastinal lymphadenopathy of nonspecific etiology. 4. Atelectasis in the lung bases. 5. Technically limited examination due to  motion artifact. Aortic Atherosclerosis (ICD10-I70.0). Electronically Signed   By: Burman Nieves M.D.   On: 08/25/2017 06:57    Scheduled Meds: . aspirin  81 mg Oral Daily  . atorvastatin  40 mg Oral q1800  . bisacodyl  10 mg Rectal Once  . budesonide (PULMICORT) nebulizer solution  0.5 mg Nebulization BID  . clopidogrel  75 mg Oral Q1200  . docusate sodium  100 mg Oral BID  . epoetin (EPOGEN/PROCRIT) injection  4,000 Units Intravenous Q M,W,F-HD  . escitalopram  2.5 mg Oral Daily  . fluticasone  1 spray Each Nare Daily  . furosemide  80 mg Intravenous Once  . gabapentin  300 mg Oral Daily  . heparin  5,000 Units Subcutaneous Q8H  . insulin aspart  0-9 Units Subcutaneous Q4H  . ipratropium-albuterol  3 mL Nebulization Q6H  . lactulose  10 g Oral q1800  . levETIRAcetam  1,500 mg Oral q1800  . lidocaine  1 patch Transdermal QHS  . mouth rinse  15 mL Mouth Rinse BID  . Melatonin  5 mg Oral QHS  . methylPREDNISolone (SOLU-MEDROL) injection  40 mg Intravenous Daily  . metoprolol tartrate  50 mg Oral BID  . multivitamin with minerals  1 tablet Oral Daily  . OXcarbazepine  150 mg Oral QHS  . pantoprazole  40 mg Oral Daily  . polyethylene glycol  17 g Oral Q1200  . polyvinyl alcohol  1 drop Both Eyes TID  . sevelamer carbonate  800 mg Oral TID AC & HS   Continuous Infusions:   Assessment/Plan:  1. Acute hypoxic respiratory failure.  Patient placed on 100% nonrebreather.  Will get a chest x-ray and ABG.  Give 1 dose of IV Lasix 80 mg IV x1 give Solu-Medrol and nebulizer treatments.  Patient on aspirin and metoprolol already.  Cardiac enzymes ordered. Transfer to ICU.  Spoke with critical care specialist.  Spoke with daughter. 2. Enterococcus UTI.  This does have some resistance pattern and she is allergic to penicillin so my only other option is Zyvox.  Spoke with the pharmacist to order this EKG reviewed and this is not a STEMI.  Code Status:     Code Status Orders  (From  admission, onward)        Start  Ordered   08/22/17 0212  Full code  Continuous     08/22/17 0211    Code Status History    Date Active Date Inactive Code Status Order ID Comments User Context   07/01/2017 1654 07/03/2017 0404 Full Code [id number]  Enedina Finner, MD Inpatient   05/15/2017 0540 05/21/2017 1406 Full Code [id number]  Ihor Austin, MD Inpatient   05/31/2016 1606 06/11/2016 2013 Full Code [id number]  Kathlene Cote, PA-C ED   03/13/2016 2236 03/16/2016 2033 Full Code [id number]  Oralia Manis, MD ED   02/25/2016 1758 02/27/2016 2332 Full Code [id number]  Shaune Pollack, MD Inpatient   01/19/2016 0454 01/20/2016 2103 Full Code [id number]  Hillary Bow, DO ED   12/25/2015 2001 01/04/2016 2024 Full Code [id number]  Houston Siren, MD Inpatient   11/19/2015 0117 11/21/2015 1956 Full Code [id number]  Hugelmeyer, Aguilar, DO Inpatient   10/06/2015 0208 10/06/2015 1832 Full Code [id number]  Ihor Austin, MD Inpatient   09/13/2015 0214 09/15/2015 1728 Full Code [id number]  Arnaldo Natal, MD Inpatient   08/24/2015 0350 08/31/2015 1847 Full Code [id number]  Ihor Austin, MD Inpatient   07/16/2015 0626 08/02/2015 1848 Full Code [id number]  Ihor Austin, MD ED   07/10/2015 0149 07/12/2015 1301 Full Code [id number]  Oralia Manis, MD Inpatient   05/12/2015 1546 05/16/2015 1735 Full Code [id number]  Auburn Bilberry, MD ED     Family Communication: Spoke with daughter on the phone again now Disposition Plan:   Reevaluate tomorrow  Consultants:  Nephrology  Time spent: 35 minutes  Elane Peabody [employer]

## 2017-08-25 NOTE — Clinical Social Work Note (Signed)
CSW spoke with [facility] and patient can return once she is medically ready for discharge an orders have been received.  CSW continuing to follow patient's progress throughout discharged.  Ervin Knack. Rozetta Stumpp, MSW, [name] [phone]  08/25/2017 4:45 PM

## 2017-08-26 ENCOUNTER — Inpatient Hospital Stay (HOSPITAL_COMMUNITY)
Admit: 2017-08-26 | Discharge: 2017-08-26 | Disposition: A | Discharge status: Home / Self Care | Payer: Medicare HMO | Attending: Internal Medicine | Admitting: Internal Medicine

## 2017-08-26 DIAGNOSIS — R079 Chest pain, unspecified: Secondary | ICD-10-CM

## 2017-08-26 LAB — GLUCOSE, CAPILLARY
GLUCOSE-CAPILLARY: 146 mg/dL — AB (ref 65–99)
GLUCOSE-CAPILLARY: 135 mg/dL — AB (ref 65–99)
GLUCOSE-CAPILLARY: 244 mg/dL — AB (ref 65–99)
Glucose-Capillary: 201 mg/dL — ABNORMAL HIGH (ref 65–99)
Glucose-Capillary: 246 mg/dL — ABNORMAL HIGH (ref 65–99)

## 2017-08-26 LAB — CBC
HEMATOCRIT: 30.5 % — AB (ref 35.0–47.0)
HEMOGLOBIN: 10 g/dL — AB (ref 12.0–16.0)
MCH: 28.8 pg (ref 26.0–34.0)
MCHC: 32.7 g/dL (ref 32.0–36.0)
MCV: 88.2 fL (ref 80.0–100.0)
Platelets: 217 10*3/uL (ref 150–440)
RBC: 3.46 MIL/uL — ABNORMAL LOW (ref 3.80–5.20)
RDW: 16 % — AB (ref 11.5–14.5)
WBC: 6.6 10*3/uL (ref 3.6–11.0)

## 2017-08-26 LAB — ECHOCARDIOGRAM COMPLETE
HEIGHTINCHES: 69 in
Weight: 3848.35 oz

## 2017-08-26 LAB — LEVETIRACETAM LEVEL: Levetiracetam Lvl: 50.2 ug/mL — ABNORMAL HIGH (ref 10.0–40.0)

## 2017-08-26 LAB — HEPARIN LEVEL (UNFRACTIONATED)
HEPARIN UNFRACTIONATED: 0.33 [IU]/mL (ref 0.30–0.70)
HEPARIN UNFRACTIONATED: 0.22 [IU]/mL — AB (ref 0.30–0.70)
Heparin Unfractionated: 0.27 IU/mL — ABNORMAL LOW (ref 0.30–0.70)

## 2017-08-26 LAB — TROPONIN I: TROPONIN I: 1.86 ng/mL — AB (ref <0.03)

## 2017-08-26 MED ORDER — HYDRALAZINE HCL 50 MG PO TABS: 50.0000 mg | ORAL_TABLET | ORAL | Status: DC

## 2017-08-26 MED ORDER — PERFLUTREN LIPID MICROSPHERE
1.0000 mL | INTRAVENOUS | Status: AC | PRN
  Administered 2017-08-26: 2 mL via INTRAVENOUS
  Filled 2017-08-26: qty 10

## 2017-08-26 MED ORDER — HYDROCODONE-ACETAMINOPHEN 5-325 MG PO TABS
1.0000 | ORAL_TABLET | Freq: Four times a day (QID) | ORAL | Status: DC | PRN
  Administered 2017-08-26 – 2017-08-30 (×6): 1 via ORAL
  Filled 2017-08-26 (×6): qty 1

## 2017-08-26 MED ORDER — ISOSORBIDE MONONITRATE ER 60 MG PO TB24
90.0000 mg | ORAL_TABLET | Freq: Every day | ORAL | Status: DC
  Administered 2017-08-27 – 2017-08-30 (×4): 90 mg via ORAL
  Filled 2017-08-26 (×2): qty 1
  Filled 2017-08-26 (×2): qty 3

## 2017-08-26 MED ORDER — HEPARIN BOLUS VIA INFUSION
1400.0000 [IU] | Freq: Once | INTRAVENOUS | Status: AC
  Administered 2017-08-26: 1400 [IU] via INTRAVENOUS
  Filled 2017-08-26: qty 1400

## 2017-08-26 MED ORDER — ISOSORBIDE MONONITRATE ER 30 MG PO TB24
30.0000 mg | ORAL_TABLET | Freq: Once | ORAL | Status: AC
  Administered 2017-08-26: 30 mg via ORAL
  Filled 2017-08-26: qty 1

## 2017-08-26 MED ORDER — AMLODIPINE BESYLATE 5 MG PO TABS
5.0000 mg | ORAL_TABLET | Freq: Every day | ORAL | Status: DC
  Administered 2017-08-26 – 2017-08-30 (×5): 5 mg via ORAL
  Filled 2017-08-26 (×5): qty 1

## 2017-08-26 MED ORDER — HEPARIN BOLUS VIA INFUSION
1350.0000 [IU] | Freq: Once | INTRAVENOUS | Status: AC
  Administered 2017-08-26: 1350 [IU] via INTRAVENOUS
  Filled 2017-08-26: qty 1350

## 2017-08-26 MED ORDER — ISOSORBIDE MONONITRATE ER 30 MG PO TB24
60.0000 mg | ORAL_TABLET | Freq: Every day | ORAL | Status: DC
  Administered 2017-08-26: 60 mg via ORAL
  Filled 2017-08-26: qty 2

## 2017-08-26 MED ORDER — HYDRALAZINE HCL 50 MG PO TABS
50.0000 mg | ORAL_TABLET | ORAL | Status: DC
  Administered 2017-08-28 – 2017-08-29 (×3): 50 mg via ORAL
  Filled 2017-08-26 (×4): qty 1

## 2017-08-26 MED ORDER — HYDRALAZINE HCL 50 MG PO TABS
50.0000 mg | ORAL_TABLET | ORAL | Status: DC
  Administered 2017-08-26 – 2017-08-30 (×6): 50 mg via ORAL
  Filled 2017-08-26 (×5): qty 1

## 2017-08-26 MED ORDER — HYDRALAZINE HCL 50 MG PO TABS
50.0000 mg | ORAL_TABLET | ORAL | Status: DC
  Filled 2017-08-26: qty 1

## 2017-08-26 NOTE — Progress Notes (Signed)
Spoke with Dr Duanne Limerick regarding patients blood pressure being in the 170's-180's. Nitro drip being increased. MD will look at her home medication. Patient states that she is having back pain. He will review. Patient states that she does not have any chest pain.

## 2017-08-26 NOTE — Progress Notes (Signed)
ANTICOAGULATION CONSULT NOTE - Initial Consult  Pharmacy Consult for heparin infusion Indication: chest pain/ACS  Allergies  Allergen Reactions  . Cephalosporins Anaphylaxis    Patient has tolerated meropenem.   [name] Penicillins Anaphylaxis and Other (See Comments)    Has patient had a PCN reaction causing immediate rash, facial/tongue/throat swelling, SOB or lightheadedness with hypotension: Yes Has patient had a PCN reaction causing severe rash involving mucus membranes or skin necrosis: No Has patient had a PCN reaction that required hospitalization No Has patient had a PCN reaction occurring within the last 10 years: No If all of the above answers are "NO", then may proceed with Cephalosporin use.  . Reglan [Metoclopramide] Other (See Comments)    Severe EPS (lip, head, body tremors) March 2018  . Risperdal [Risperidone] Other (See Comments)    Severe EPS (lip, head, body tremors) March 2018  . Lamictal [Lamotrigine] Other (See Comments)    Reaction:  Hallucinations  . Phenergan [Promethazine Hcl] Nausea And Vomiting    Sensitivity to medicine  . Pravastatin Other (See Comments)    Reaction:  Muscle pain   . Sulfa Antibiotics Other (See Comments)    Reaction:  Unknown     Patient Measurements: Height: 5\' 9"  (175.3 cm) Weight: 240 lb 8.4 oz (109.1 kg) IBW/kg (Calculated) : 66.2 Heparin Dosing Weight: 91.1 kg  Vital Signs: Temp: 98.2 F (36.8 C) (05/28 0700) Temp Source: Axillary (05/28 0500) BP: 159/82 (05/28 1230) Pulse Rate: 82 (05/28 1230)  Labs: Recent Labs    08/25/17 1601 08/25/17 1815 08/25/17 2236 08/26/17 0308 08/26/17 1133  HGB  --  11.0*  --  10.0*  --   HCT  --  33.7*  --  30.5*  --   PLT  --  239  --  217  --   APTT  --  36  --   --   --   LABPROT  --  13.1  --   --   --   INR  --  1.00  --   --   --   HEPARINUNFRC  --   --   --  0.27* 0.33  TROPONINI 3.02* 2.84* 2.45*  --   --     Estimated Creatinine Clearance: 19.2 mL/min (A) (by C-G formula  based on SCr of 4.46 mg/dL (H)).   Medical History: Past Medical History:  Diagnosis Date  . Anginal pain (HCC)   . Anxiety   . Bipolar disorder (HCC)   . CAD (coronary artery disease)   . CHF (congestive heart failure) (HCC)   . Chronic lower back pain   . Depression   . Endometriosis   . ESRD (end stage renal disease) on dialysis (HCC)    "[facility]; [address]; [place]; TTS" (01/19/2016)  . Gastroparesis   . GERD (gastroesophageal reflux disease)   . History of blood transfusion "several"   "my blood would get low; low RBC"  . History of hiatal hernia   . HLD (hyperlipidemia)   . Hypertension   . Migraine    "monthly" (01/19/2016)  . Myocardial infarction (HCC) 2017   "~ 3 wks ago" (01/19/2016)  . Renal disorder   . Renal insufficiency   . Seizures (HCC) 07/2015   "I've only had the 1; don't know what from" (01/19/2016)  . Stroke (HCC)   . Type II diabetes mellitus (HCC)     Medications:  Infusions:  . aztreonam Stopped (08/26/17 0939)  . heparin 1,450 Units/hr (08/26/17 1002)  . nitroGLYCERIN  160 mcg/min (08/26/17 1217)    Assessment: 58 yof with extensive PMH including CAD s/p PCI with 2 stents 10 years ago, CHF, ESRD on HD, PVD, right BKA, HTN, DM, and seizure disorder. Had CP at end of HD session today, initial troponin 3.02. Pharmacy consulted to dose heparin for ACS.   Goal of Therapy:  Heparin level 0.3-0.7 units/ml Monitor platelets by anticoagulation protocol: Yes   Plan:  Will continue heparin drip at current rate and recheck a HL in 8 hours.   Luisa Hart D, Pharm.D., BCPS Clinical Pharmacist 08/26/2017,1:03 PM

## 2017-08-26 NOTE — Progress Notes (Signed)
Name: Kimberly Montgomery MRN: [id number] DOB: [birth year]     CONSULTATION DATE: 08/21/2017  Subjective & objectives: Remains on heparin and nitroglycerin drip, denied chest pain however complaining of chronic back pain.  PAST MEDICAL HISTORY :   has a past medical history of Anginal pain (HCC), Anxiety, Bipolar disorder (HCC), CAD (coronary artery disease), CHF (congestive heart failure) (HCC), Chronic lower back pain, Depression, Endometriosis, ESRD (end stage renal disease) on dialysis (HCC), Gastroparesis, GERD (gastroesophageal reflux disease), History of blood transfusion ("several"), History of hiatal hernia, HLD (hyperlipidemia), Hypertension, Migraine, Myocardial infarction (HCC) (2017), Renal disorder, Renal insufficiency, Seizures (HCC) (07/2015), Stroke (HCC), and Type II diabetes mellitus (HCC).  has a past surgical history that includes Hysterotomy; Below knee leg amputation (Right, 2010?); Cardiac catheterization (N/A, 08/30/2015); Cardiac catheterization (N/A, 01/01/2016); Abdominal hysterectomy; Tubal ligation; Peritoneal catheter removal (01/01/2016); Salpingoophorectomy (Right); Cardiac catheterization (Right, 07/10/2015); Cardiac catheterization (Right, 09/14/2015); Cardiac catheterization (N/A, 09/14/2015); Cardiac catheterization (Right, 11/20/2015); Coronary angioplasty with stent (<2017); Peritoneal catheter insertion (11/03/2013); Peritoneal catheter removal (11/03/2013; 02/11/2014); TEE without cardioversion (N/A, 06/07/2016); Cataract extraction, bilateral; Coronary angioplasty with stent; AV fistula placement (Right, 08/23/2016); and DIALYSIS/PERMA CATHETER REMOVAL (N/A, 11/12/2016). Prior to Admission medications   Medication Sig Start Date End Date Taking? Authorizing Provider  acetaminophen (TYLENOL) 500 MG tablet Take 500 mg by mouth every 6 (six) hours as needed for moderate pain or fever.   Yes [provider]  albuterol (PROVENTIL) (2.5 MG/3ML) 0.083% nebulizer solution  Take 2.5 mg by nebulization every 4 (four) hours as needed for wheezing or shortness of breath.   Yes [provider]  amLODipine (NORVASC) 10 MG tablet Take 10 mg by mouth daily.    Yes [provider]  aspirin 81 MG chewable tablet Chew 81 mg by mouth daily.   Yes [provider]  atorvastatin (LIPITOR) 40 MG tablet Take 40 mg by mouth daily at 6 PM.   Yes [provider]  benztropine (COGENTIN) 0.5 MG tablet Take 0.5 mg by mouth 2 (two) times daily.   Yes [provider]  bisacodyl (DULCOLAX) 10 MG suppository Place 10 mg rectally as needed for moderate constipation.   Yes [provider]  carboxymethylcellulose (REFRESH) 1 % ophthalmic solution Place 1 drop into both eyes 3 (three) times daily.   Yes [provider]  clonazePAM (KLONOPIN) 0.5 MG tablet Take  tablet (0.25MG ) by mouth every morning and 1 tablet (0.5MG ) by mouth every evening   Yes [provider]  clopidogrel (PLAVIX) 75 MG tablet Take 75 mg by mouth daily at 12 noon.    Yes [provider]  escitalopram (LEXAPRO) 5 MG tablet Take 2.5 mg by mouth daily.   Yes [provider]  fluticasone (FLONASE) 50 MCG/ACT nasal spray Place 1 spray into both nostrils daily.    Yes [provider]  gabapentin (NEURONTIN) 300 MG capsule Take 300 mg by mouth daily.    Yes [provider]  hydrALAZINE (APRESOLINE) 50 MG tablet Take 1 tablet (50MG ) by mouth three times daily (Sunday, Tuesday, Thursday & Saturday) and take 1 tablet (50MG ) by mouth twice daily (Monday, Wednesday & Friday).   Yes [provider]  HYDROcodone-acetaminophen (NORCO/VICODIN) 5-325 MG tablet Take 1 tablet by mouth every 6 (six) hours as needed for moderate pain or severe pain. 05/20/17  Yes Delfino Lovett, MD  insulin aspart (NOVOLOG) 100 UNIT/ML injection Inject 7 Units into the skin 3 (three) times daily with meals.    Yes [provider]  insulin  detemir (LEVEMIR) 100 UNIT/ML injection Inject 4 Units into the skin daily.    Yes [provider]  isosorbide mononitrate (IMDUR) 30 MG 24 hr tablet Take 90 mg by mouth daily at 12 noon.    Yes [provider]  lactulose (CHRONULAC) 10 GM/15ML solution Take 10 g by mouth daily at 6 PM.    Yes [provider]  levETIRAcetam (KEPPRA) 750 MG tablet Take 2 tablets (1,500 mg total) by mouth daily at 12 noon. 05/20/17  Yes Delfino Lovett, MD  lidocaine (LIDODERM) 5 % Place 1 patch onto the skin at bedtime.    Yes [provider]  lidocaine (LMX) 4 % cream Apply 1 application topically daily as needed (pain). To right arm fistula   Yes [provider]  linagliptin (TRADJENTA) 5 MG TABS tablet Take 5 mg by mouth daily.   Yes [provider]  lisinopril (PRINIVIL,ZESTRIL) 10 MG tablet Take 10 mg by mouth daily at 12 noon.   Yes [provider]  Melatonin 3 MG TABS Take 3 mg by mouth at bedtime.   Yes [provider]  metoprolol (LOPRESSOR) 50 MG tablet Take 1 tablet (50 mg total) by mouth 2 (two) times daily. 09/15/15  Yes Sudini, Wardell Heath, MD  multivitamin (ONE-A-DAY MEN'S) TABS tablet Take 1 tablet by mouth daily.   Yes [provider]  nitroGLYCERIN (NITROSTAT) 0.4 MG SL tablet Place 0.4 mg under the tongue every 5 (five) minutes as needed for chest pain. Reported on 07/10/2015   Yes [provider]  omeprazole (PRILOSEC) 20 MG capsule Take 20 mg by mouth 2 (two) times daily.    Yes [provider]  ondansetron (ZOFRAN ODT) 4 MG disintegrating tablet Take 1 tablet (4 mg total) by mouth every 6 (six) hours as needed for nausea or vomiting. 07/16/15  Yes Sharyn Creamer, MD  OXcarbazepine (TRILEPTAL) 150 MG tablet Take 150 mg by mouth at bedtime.   Yes [provider]  polyethylene glycol (MIRALAX / GLYCOLAX) packet Take 17 g by mouth daily. Patient taking differently: Take 17 g by mouth daily at 12 noon.  08/02/15   Yes Adrian Saran, MD  senna (SENOKOT) 8.6 MG TABS tablet Take 2 tablets by mouth 2 (two) times daily.    Yes [provider]  sevelamer carbonate (RENVELA) 800 MG tablet Take 800 mg by mouth 4 (four) times daily -  before meals and at bedtime.    Yes [provider]  torsemide (DEMADEX) 100 MG tablet Take 100 mg by mouth daily.    Yes [provider]   Allergies  Allergen Reactions  . Cephalosporins Anaphylaxis    Patient has tolerated meropenem.   [name] Penicillins Anaphylaxis and Other (See Comments)    Has patient had a PCN reaction causing immediate rash, facial/tongue/throat swelling, SOB or lightheadedness with hypotension: Yes Has patient had a PCN reaction causing severe rash involving mucus membranes or skin necrosis: No Has patient had a PCN reaction that required hospitalization No Has patient had a PCN reaction occurring within the last 10 years: No If all of the above answers are "NO", then may proceed with Cephalosporin use.  . Reglan [Metoclopramide] Other (See Comments)    Severe EPS (lip, head, body tremors) March 2018  . Risperdal [Risperidone] Other (See Comments)    Severe EPS (lip, head, body tremors) March 2018  . Lamictal [Lamotrigine] Other (See Comments)    Reaction:  Hallucinations  . Phenergan [Promethazine Hcl]  Nausea And Vomiting    Sensitivity to medicine  . Pravastatin Other (See Comments)    Reaction:  Muscle pain   . Sulfa Antibiotics Other (See Comments)    Reaction:  Unknown     FAMILY HISTORY:  family history includes Bipolar disorder in her unknown relative; CAD in her unknown relative; Cervical cancer in her mother; Diabetes in her unknown relative; Other in her father. SOCIAL HISTORY:  reports that she quit smoking about 2 years ago. Her smoking use included cigarettes. She smoked 0.75 packs per day. She has never used smokeless tobacco. She reports that she does not drink alcohol or use drugs.  REVIEW OF SYSTEMS:     Unable to obtain due to critical illness   VITAL SIGNS: Temp:  [97.8 F (36.6 C)-99.1 F (37.3 C)] 98.2 F (36.8 C) (05/28 0700) Pulse Rate:  [63-100] 79 (05/28 0808) Resp:  [10-37] 19 (05/28 0808) BP: (145-220)/(57-95) 161/67 (05/28 0808) SpO2:  [84 %-100 %] 98 % (05/28 0808) FiO2 (%):  [30 %-100 %] 30 % (05/28 0210) Weight:  [109.1 kg (240 lb 8.4 oz)-111.6 kg (246 lb 0.5 oz)] 109.1 kg (240 lb 8.4 oz) (05/28 0404)  Physical Examination:  Awake and oriented with no focal neurological deficits On BiPAP, no distress, able to talk in full sentences, bilateral equal air entry and no adventitious sounds S1 & S2 are audible with no murmur Benign obese abdomen was normal peristalsis Wasted extremities no edema on the right below-knee amputation  ASSESSMENT / PLAN: Acute respiratory failure tolerating BiPAP.  Trial BiPAP -Monitor ABG and optimize BiPAP settings  NSTEMI.cardiac enzymes are trending down. TEE 05/2017, LVEF 55-60% and no wall motion abnormality. History of CAD status post PCI and 2 stents 10 years ago as per patient -Heparin gtt + ASA + Plavix + Statin + BB. -Monitor CI, ECHO and follow with cardiology consult.  Hypertensive emergency with acute coronary syndrome -Optimize antihypertensives and monitor hemodynamics  Atelectasis and possible pneumonia with infective etiology can not be ruled out -Started on Zyvox + Azactam. Monitor CXR + CBC + FIO2.  Non specific incidental mediastinal adenopathy.  -Follow up CT in 3 weeks and consider pulmonary consult.  End-stage renal disease on HD as per renal  Enterococcus urinary tract infection on Zyvox with history of penicillin allergy  Seizure disorder on Keppra  GERD on Protonix  Dyslipidemia on atorvastatin  Anemia -Keep HB . 8 gm/dl.  Full code  DVT & GI prophylaxis.  Continue with supportive care  Critical care time 45 minutes

## 2017-08-26 NOTE — Consult Note (Signed)
Cardiology Consultation:   Patient ID: Kimberly Montgomery; [id number]; July 15, 1965   Admit date: 08/21/2017 Date of Consult: 08/26/2017  Primary Care Provider: Juluis Pitch, MD Primary Cardiologist: New to [facility] - consult by Fletcher Anon   Patient Profile:   Kimberly Montgomery is a 52 y.o. female with a hx of CAD s/p PCI to the LAD and RCA in 08/2015 by outside cardiology group, chronic diastolic CHF, ESRD on HD (MWF), PAD s/p right BKA, morbid obesity, anemia of chronic disease  who is being seen today for the evaluation of elevated troponin at the request of Dr. Soyla Murphy.  History of Present Illness:   Kimberly Montgomery was previously followed by Dr. Humphrey Rolls, though has not seen him in several years. She underwent LHC in 08/2015 that showed mid LAD 90% s/p PCI/DES, distal LAD 65%, OM1 50%, mid RCA 30% that was previously treated with stenting in the remote past, RPLB 40%. In 10/2015, she redeveloped chest pain and underwent re-look LHC that showed patent LAD and RCA stents with proximal to mid LAD 30%, OM1 50%, proximal to mid RCA 35%. Imdur was added. She has been lost to follow up since.   Admitted to [facility] in 05/2016 with seizure and MRSA bacteremia. TEE negative for vegetations with normal EF noted on TTE and TEE. SHe has since been seen in the ED in 03/2017 for chest pain with negative cardiac enzymes. Admitted in 05/2017 for AMS in the setting of HCAP. Most recently admitted 5/23 with AMS in the setting of UTI. She was noted to be volume overloaded and has been undergoing HD for volume removal. Initial troponin was negative. While in HD on 5/27, she developed sudden onset of substernal chest pressure that "felt like someone was pushing on me." There was associated SOB and diaphoresis. She was placed on NRB with 100% FiO2. Initial troponin was noted to be elevated at 3.02. EKG was of poor quality, though did show nonspecific st/t changes. She was started on a heparin gtt. Patient reports her pain persisted, "until I  fell asleep." Upon waking up, her pain was improved. Troponin has subsequently down trended to 2.45. She remains on heparin gtt. Echo is pending. She is scheduled for HD on 5/29.    Past Medical History:  Diagnosis Date  . Anginal pain ([place])   . Anxiety   . Bipolar disorder ([place])   . CAD (coronary artery disease)   . CHF (congestive heart failure) ([place])   . Chronic lower back pain   . Depression   . Endometriosis   . ESRD (end stage renal disease) on dialysis ([place])    "[facility]; [place]; [facility] [place]; TTS" (01/19/2016)  . Gastroparesis   . GERD (gastroesophageal reflux disease)   . History of blood transfusion "several"   "my blood would get low; low RBC"  . History of hiatal hernia   . HLD (hyperlipidemia)   . Hypertension   . Migraine    "monthly" (01/19/2016)  . Myocardial infarction ([place]) 2017   "~ 3 wks ago" (01/19/2016)  . Renal disorder   . Renal insufficiency   . Seizures ([place]) 07/2015   "I've only had the 1; don't know what from" (01/19/2016)  . Stroke ([place])   . Type II diabetes mellitus ([place])     Past Surgical History:  Procedure Laterality Date  . ABDOMINAL HYSTERECTOMY     "partial"  . AV FISTULA PLACEMENT Right 08/23/2016   Procedure: ARTERIOVENOUS (AV) FISTULA CREATION  ( BRACHIAL CEPHALIC );  Surgeon: Katha Cabal, MD;  Location: [facility] ORS;  Service: Vascular;  Laterality: Right;  . BELOW KNEE LEG AMPUTATION Right 2010?  [name] CARDIAC CATHETERIZATION Right 07/10/2015   Procedure: Left Heart Cath and Coronary Angiography;  Surgeon: Dionisio David, MD;  Location: [place] CV LAB;  Service: Cardiovascular;  Laterality: Right;  . CARDIAC CATHETERIZATION Right 09/14/2015   Procedure: Left Heart Cath and Coronary Angiography;  Surgeon: Dionisio David, MD;  Location: [place] CV LAB;  Service: Cardiovascular;  Laterality: Right;  . CARDIAC CATHETERIZATION N/A 09/14/2015   Procedure: Coronary Stent Intervention;  Surgeon: Yolonda Kida, MD;  Location:  [place] CV LAB;  Service: Cardiovascular;  Laterality: N/A;  . CARDIAC CATHETERIZATION Right 11/20/2015   Procedure: Left Heart Cath and Coronary Angiography;  Surgeon: Dionisio David, MD;  Location: [place] CV LAB;  Service: Cardiovascular;  Laterality: Right;  . CATARACT EXTRACTION, BILATERAL    . CORONARY ANGIOPLASTY WITH STENT PLACEMENT  <2017   @ [facility]/notes 07/03/2013  . CORONARY ANGIOPLASTY WITH STENT PLACEMENT    . DIALYSIS/PERMA CATHETER REMOVAL N/A 11/12/2016   Procedure: DIALYSIS/PERMA CATHETER REMOVAL;  Surgeon: Katha Cabal, MD;  Location: [place] CV LAB;  Service: Cardiovascular;  Laterality: N/A;  . HYSTEROTOMY    . PERIPHERAL VASCULAR CATHETERIZATION N/A 08/30/2015   Procedure: Dialysis/Perma Catheter Insertion;  Surgeon: Algernon Huxley, MD;  Location: [place] CV LAB;  Service: Cardiovascular;  Laterality: N/A;  . PERIPHERAL VASCULAR CATHETERIZATION N/A 01/01/2016   Procedure: Dialysis/Perma Catheter Insertion;  Surgeon: Algernon Huxley, MD;  Location: [place] CV LAB;  Service: Cardiovascular;  Laterality: N/A;  . PERITONEAL CATHETER INSERTION  11/03/2013   Archie Endo 11/03/2013  . PERITONEAL CATHETER REMOVAL  01/01/2016   "took the one from May out; put new PD cath in" (01/19/2016)  . PERITONEAL CATHETER REMOVAL  11/03/2013; 02/11/2014   Archie Endo 11/03/2013; Removal of tunneled catheter/notes 02/11/2014  . SALPINGOOPHORECTOMY Right    Archie Endo 07/03/2013  . TEE WITHOUT CARDIOVERSION N/A 06/07/2016   Procedure: TRANSESOPHAGEAL ECHOCARDIOGRAM (TEE);  Surgeon: Pixie Casino, MD;  Location[facility] ENDOSCOPY;  Service: Cardiovascular;  Laterality: N/A;  . TUBAL LIGATION       Home Meds: Prior to Admission medications   Medication Sig Start Date End Date Taking? Authorizing Provider  acetaminophen (TYLENOL) 500 MG tablet Take 500 mg by mouth every 6 (six) hours as needed for moderate pain or fever.   Yes [provider]  albuterol (PROVENTIL) (2.5 MG/3ML) 0.083%  nebulizer solution Take 2.5 mg by nebulization every 4 (four) hours as needed for wheezing or shortness of breath.   Yes [provider]  amLODipine (NORVASC) 10 MG tablet Take 10 mg by mouth daily.    Yes [provider]  aspirin 81 MG chewable tablet Chew 81 mg by mouth daily.   Yes [provider]  atorvastatin (LIPITOR) 40 MG tablet Take 40 mg by mouth daily at 6 PM.   Yes [provider]  benztropine (COGENTIN) 0.5 MG tablet Take 0.5 mg by mouth 2 (two) times daily.   Yes [provider]  bisacodyl (DULCOLAX) 10 MG suppository Place 10 mg rectally as needed for moderate constipation.   Yes [provider]  carboxymethylcellulose (REFRESH) 1 % ophthalmic solution Place 1 drop into both eyes 3 (three) times daily.   Yes [provider]  clonazePAM (KLONOPIN) 0.5 MG tablet Take  tablet (0.25MG) by mouth every morning and 1 tablet (0.5MG) by mouth every evening  Yes [provider]  clopidogrel (PLAVIX) 75 MG tablet Take 75 mg by mouth daily at 12 noon.    Yes [provider]  escitalopram (LEXAPRO) 5 MG tablet Take 2.5 mg by mouth daily.   Yes [provider]  fluticasone (FLONASE) 50 MCG/ACT nasal spray Place 1 spray into both nostrils daily.    Yes [provider]  gabapentin (NEURONTIN) 300 MG capsule Take 300 mg by mouth daily.    Yes [provider]  hydrALAZINE (APRESOLINE) 50 MG tablet Take 1 tablet (50MG) by mouth three times daily (Sunday, Tuesday, Thursday & Saturday) and take 1 tablet (50MG) by mouth twice daily (Monday, Wednesday & Friday).   Yes [provider]  HYDROcodone-acetaminophen (NORCO/VICODIN) 5-325 MG tablet Take 1 tablet by mouth every 6 (six) hours as needed for moderate pain or severe pain. 05/20/17  Yes Max Sane, MD  insulin aspart (NOVOLOG) 100 UNIT/ML injection Inject 7 Units into the skin 3 (three) times daily with meals.    Yes [provider]  insulin detemir (LEVEMIR) 100 UNIT/ML injection Inject 4 Units into the skin daily.    Yes [provider]  isosorbide mononitrate (IMDUR) 30 MG 24 hr tablet Take 90 mg by mouth daily at 12 noon.    Yes [provider]  lactulose (CHRONULAC) 10 GM/15ML solution Take 10 g by mouth daily at 6 PM.    Yes [provider]  levETIRAcetam (KEPPRA) 750 MG tablet Take 2 tablets (1,500 mg total) by mouth daily at 12 noon. 05/20/17  Yes Max Sane, MD  lidocaine (LIDODERM) 5 % Place 1 patch onto the skin at bedtime.    Yes [provider]  lidocaine (LMX) 4 % cream Apply 1 application topically daily as needed (pain). To right arm fistula   Yes [provider]  linagliptin (TRADJENTA) 5 MG TABS tablet Take 5 mg by mouth daily.   Yes [provider]  lisinopril (PRINIVIL,ZESTRIL) 10 MG tablet Take 10 mg by mouth daily at 12 noon.   Yes [provider]  Melatonin 3 MG TABS Take 3 mg by mouth at bedtime.   Yes [provider]  metoprolol (LOPRESSOR) 50 MG tablet Take 1 tablet (50 mg total) by mouth 2 (two) times daily. 09/15/15  Yes Sudini, Alveta Heimlich, MD  multivitamin (ONE-A-DAY MEN'S) TABS tablet Take 1 tablet by mouth daily.   Yes [provider]  nitroGLYCERIN (NITROSTAT) 0.4 MG SL tablet Place 0.4 mg under the tongue every 5 (five) minutes as needed for chest pain. Reported on 07/10/2015   Yes [provider]  omeprazole (PRILOSEC) 20 MG capsule Take 20 mg by mouth 2 (two) times daily.    Yes [provider]  ondansetron (ZOFRAN ODT) 4 MG disintegrating tablet Take 1 tablet (4 mg total) by mouth every 6 (six) hours as needed for nausea or vomiting. 07/16/15  Yes Delman Kitten, MD  OXcarbazepine (TRILEPTAL) 150 MG tablet Take 150 mg by mouth at bedtime.   Yes [provider]  polyethylene glycol (MIRALAX / GLYCOLAX) packet Take 17 g by mouth daily. Patient taking differently: Take 17 g by mouth daily at 12  noon.  08/02/15  Yes Bettey Costa, MD  senna (SENOKOT) 8.6 MG TABS tablet Take 2 tablets by mouth 2 (two) times daily.    Yes [provider]  sevelamer carbonate (RENVELA) 800 MG tablet Take 800 mg by mouth 4 (four) times daily -  before meals and at bedtime.  Yes [provider]  torsemide (DEMADEX) 100 MG tablet Take 100 mg by mouth daily.    Yes [provider]    Inpatient Medications: Scheduled Meds: . aspirin  81 mg Oral Daily  . atorvastatin  40 mg Oral q1800  . budesonide (PULMICORT) nebulizer solution  0.5 mg Nebulization BID  . clopidogrel  75 mg Oral Q1200  . docusate sodium  100 mg Oral BID  . epoetin (EPOGEN/PROCRIT) injection  4,000 Units Intravenous Q M,W,F-HD  . escitalopram  2.5 mg Oral Daily  . fluticasone  1 spray Each Nare Daily  . gabapentin  300 mg Oral Daily  . insulin aspart  0-9 Units Subcutaneous Q4H  . ipratropium-albuterol  3 mL Nebulization Q6H  . lactulose  10 g Oral q1800  . levETIRAcetam  1,500 mg Oral q1800  . lidocaine  1 patch Transdermal QHS  . linezolid  600 mg Oral Q12H  . mouth rinse  15 mL Mouth Rinse BID  . Melatonin  5 mg Oral QHS  . metoprolol tartrate  50 mg Oral BID  . multivitamin with minerals  1 tablet Oral Daily  . OXcarbazepine  150 mg Oral QHS  . pantoprazole  40 mg Oral Daily  . polyethylene glycol  17 g Oral Q1200  . polyvinyl alcohol  1 drop Both Eyes TID  . sevelamer carbonate  800 mg Oral TID AC & HS   Continuous Infusions: . aztreonam Stopped (08/26/17 0939)  . heparin 1,450 Units/hr (08/26/17 1002)  . nitroGLYCERIN 90 mcg/min (08/26/17 1001)   PRN Meds: acetaminophen **OR** acetaminophen, benztropine, bisacodyl, bisacodyl, clonazePAM **AND** [DISCONTINUED] clonazePAM, hydrALAZINE, HYDROcodone-acetaminophen, lip balm, nitroGLYCERIN, ondansetron **OR** ondansetron (ZOFRAN) IV, sodium phosphate, traZODone  Allergies:   Allergies  Allergen Reactions  . Cephalosporins Anaphylaxis    Patient  has tolerated meropenem.   [name] Penicillins Anaphylaxis and Other (See Comments)    Has patient had a PCN reaction causing immediate rash, facial/tongue/throat swelling, SOB or lightheadedness with hypotension: Yes Has patient had a PCN reaction causing severe rash involving mucus membranes or skin necrosis: No Has patient had a PCN reaction that required hospitalization No Has patient had a PCN reaction occurring within the last 10 years: No If all of the above answers are "NO", then may proceed with Cephalosporin use.  . Reglan [Metoclopramide] Other (See Comments)    Severe EPS (lip, head, body tremors) March 2018  . Risperdal [Risperidone] Other (See Comments)    Severe EPS (lip, head, body tremors) March 2018  . Lamictal [Lamotrigine] Other (See Comments)    Reaction:  Hallucinations  . Phenergan [Promethazine Hcl] Nausea And Vomiting    Sensitivity to medicine  . Pravastatin Other (See Comments)    Reaction:  Muscle pain   . Sulfa Antibiotics Other (See Comments)    Reaction:  Unknown     Social History:   Social History   Socioeconomic History  . Marital status: Widowed    Spouse name: Not on file  . Number of children: 2  . Years of education: Not on file  . Highest education level: Not on file  Occupational History  . Occupation: disabled  Social Needs  . Financial resource strain: Not on file  . Food insecurity:    Worry: Not on file    Inability: Not on file  . Transportation needs:    Medical: Not on file    Non-medical: Not on file  Tobacco Use  . Smoking status: Former Smoker    Packs/day:  0.75    Types: Cigarettes    Last attempt to quit: 07/01/2015    Years since quitting: 2.1  . Smokeless tobacco: Never Used  Substance and Sexual Activity  . Alcohol use: No    Alcohol/week: 0.0 oz  . Drug use: No  . Sexual activity: Not Currently  Lifestyle  . Physical activity:    Days per week: Not on file    Minutes per session: Not on file  . Stress: Not on file   Relationships  . Social connections:    Talks on phone: Not on file    Gets together: Not on file    Attends religious service: Not on file    Active member of club or organization: Not on file    Attends meetings of clubs or organizations: Not on file    Relationship status: Not on file  . Intimate partner violence:    Fear of current or ex partner: Not on file    Emotionally abused: Not on file    Physically abused: Not on file    Forced sexual activity: Not on file  Other Topics Concern  . Not on file  Social History Narrative   06/17/16 currently at [facility]     Family History:  Family History  Problem Relation Age of Onset  . CAD Unknown   . Diabetes Unknown   . Bipolar disorder Unknown   . Cervical cancer Mother   . Other Father        heart bypass  . Breast cancer Neg Hx     ROS:  Review of Systems  Constitutional: Positive for malaise/fatigue. Negative for chills, diaphoresis, fever and weight loss.  HENT: Negative for congestion.   Eyes: Negative for discharge and redness.  Respiratory: Positive for shortness of breath. Negative for cough, hemoptysis, sputum production and wheezing.   Cardiovascular: Positive for chest pain and leg swelling. Negative for palpitations, orthopnea, claudication and PND.  Gastrointestinal: Negative for abdominal pain, blood in stool, heartburn, melena, nausea and vomiting.  Genitourinary: Negative for hematuria.  Musculoskeletal: Negative for falls and myalgias.  Skin: Negative for rash.  Neurological: Positive for weakness. Negative for dizziness, tingling, tremors, sensory change, speech change, focal weakness and loss of consciousness.  Endo/Heme/Allergies: Does not bruise/bleed easily.  Psychiatric/Behavioral: Negative for substance abuse. The patient is not nervous/anxious.   All other systems reviewed and are negative.     Physical Exam/Data:   Vitals:   08/26/17 0808 08/26/17 0830 08/26/17 0900 08/26/17 0930    BP: (!) 161/67 (!) 162/71 (!) 160/69 (!) 166/71  Pulse: 79 80 81 82  Resp: 19   (!) 22  Temp:      TempSrc:      SpO2: 98% 95% 98% 97%  Weight:      Height:        Intake/Output Summary (Last 24 hours) at 08/26/2017 1019 Last data filed at 08/26/2017 0400 Gross per 24 hour  Intake 173.98 ml  Output 1181 ml  Net -1007.02 ml   Filed Weights   08/25/17 1450 08/25/17 1659 08/26/17 0404  Weight: 244 lb 11.4 oz (111 kg) 244 lb 0.8 oz (110.7 kg) 240 lb 8.4 oz (109.1 kg)   Body mass index is 35.52 kg/m.   Physical Exam: General: Well developed, well nourished, in no acute distress. Head: Normocephalic, atraumatic, sclera non-icteric, no xanthomas, nares without discharge.  Neck: Negative for carotid bruits. JVD difficult to assess secondary to body habitus. Lungs: Clear bilaterally to  auscultation without wheezes, rales, or rhonchi. Breathing is unlabored. Heart: RRR with S1 S2. No murmurs, rubs, or gallops appreciated. Abdomen: Soft, non-tender, non-distended with normoactive bowel sounds. No hepatomegaly. No rebound/guarding. No obvious abdominal masses. Msk:  Strength and tone appear normal for age. Extremities: No clubbing or cyanosis. No edema. Status post right BKA. No edema of the stump. Left DP 1+. Neuro: Alert and oriented X 3. No facial asymmetry. No focal deficit. Moves all extremities spontaneously. Psych:  Responds to questions appropriately with a normal affect.   EKG:  The EKG was personally reviewed and demonstrates: NSR, 97 bpm, baseline wandering, poor R wave progression, nonspecific st/t changes  Telemetry:  Telemetry was personally reviewed and demonstrates: NSR  Weights: Filed Weights   08/25/17 1450 08/25/17 1659 08/26/17 0404  Weight: 244 lb 11.4 oz (111 kg) 244 lb 0.8 oz (110.7 kg) 240 lb 8.4 oz (109.1 kg)    Relevant CV Studies: LHC 08/2015: Conclusion    Mid RCA lesion, 30% stenosed. The lesion was previously treated with a stent (unknown  type).  1st RPLB lesion, 40% stenosed.  1st Mrg lesion, 50% stenosed. The lesion was not previously treated.  Mid LAD lesion, 90% stenosed. The lesion was not previously treated.  Dist LAD lesion, 65% stenosed. The lesion was not previously treated.   High grade lesion mid LAD treated with DES.    LHC 10/2015: Conclusion     1st Mrg lesion, 50 %stenosed.  Prox LAD to Mid LAD lesion, 30 %stenosed.  Dist LAD lesion, 50 %stenosed.  Prox RCA to Dist RCA lesion, 35 %stenosed.   Stent in the mid LAD was patent and there is mild to moderate disease in the distal LAD is as well as the in stent in the right coronary is patent. LV gram was deferred due to renal insufficiency. Advice medical therapy by adding isosorbide.    TTE 05/2016: Study Conclusions  - Left ventricle: The cavity size was normal. Wall thickness was   normal. Systolic function was normal. The estimated ejection   fraction was in the range of 55% to 60%. - Aortic valve: Valve area (VTI): 2.27 cm^2. Valve area (Vmax):   2.21 cm^2. Valve area (Vmean): 2.08 cm^2. - Mitral valve: Significant MAC cannot r/o vegetation on atrial   surface of posterior leaflet vs MAC and leaflet thickening . No   significant MR Suggest TEE if clinically indicated - Atrial septum: No defect or patent foramen ovale was identified.   TEE3/2018: Study Conclusions  - Left ventricle: The cavity size was normal. Wall thickness was   normal. Systolic function was normal. The estimated ejection   fraction was in the range of 55% to 60%. Wall motion was normal;   there were no regional wall motion abnormalities. - Aortic valve: No evidence of vegetation. - Mitral valve: Moderate posterior MAC. There is a < 1cm   pedunculated calcified mass in the P2 area of the mitral annulus.   No regurgitation. - Left atrium: No evidence of thrombus in the atrial cavity or   appendage. - Right atrium: No evidence of thrombus in the atrial cavity or    appendage. - Atrial septum: No defect or patent foramen ovale was identified. - Pulmonic valve: No evidence of vegetation.  Impressions:  - There was no evidence of an acute valvular vegetation. Mitral   annulur calcification with possibly a calcified thrombus or old   vegetation which is calcified on the mitral annulus.   Laboratory Data:  Chemistry  Recent Labs  Lab 08/21/17 2236 08/22/17 0633 08/23/17 0522  NA 131* 129* 136  K 6.2* 5.9* 4.3  CL 93* 93* 96*  CO2 _0 GLUCOSE 90 99 100*  BUN 37* 40* 19  CREATININE 6.39* 6.83* 4.46*  CALCIUM 9.1 8.6* 8.5*  GFRNONAA 7* 6* 10*  GFRAA 8* 7* 12*  ANIONGAP _1 Recent Labs  Lab 08/21/17 2236 08/22/17 0633 08/23/17 0522  PROT 7.5  --   --   ALBUMIN 3.7 3.3* 3.3*  AST 15  --   --   ALT 9*  --   --   ALKPHOS 151*  --   --   BILITOT 0.5  --   --    Hematology Recent Labs  Lab 08/22/17 0633 08/25/17 1815 08/26/17 0308  WBC 4.8 9.8 6.6  RBC 3.98 3.83 3.46*  HGB 11.6* 11.0* 10.0*  HCT 34.8* 33.7* 30.5*  MCV 87.5 88.0 88.2  MCH 29.2 28.8 28.8  MCHC 33.4 32.7 32.7  RDW 17.0* 16.5* 16.0*  PLT 200 239 217   Cardiac Enzymes Recent Labs  Lab 08/21/17 2236 08/25/17 1601 08/25/17 1815 08/25/17 2236  TROPONINI <0.03 3.02* 2.84* 2.45*   No results for input(s): TROPIPOC in the last 168 hours.  BNP Recent Labs  Lab 08/21/17 2236  BNP 1,509.0*    DDimer No results for input(s): DDIMER in the last 168 hours.  Radiology/Studies:  Ct Angio Chest Pe W Or Wo Contrast  Result Date: 08/25/2017 IMPRESSION: 1. No evidence of significant pulmonary embolus. 2. Probable congestive changes with cardiac enlargement, bilateral pleural effusions, and mosaic attenuation pattern to the lungs which may indicate edema or motion artifact. 3. Mediastinal lymphadenopathy of nonspecific etiology. 4. Atelectasis in the lung bases. 5. Technically limited examination due to motion artifact. Aortic Atherosclerosis  (ICD10-I70.0). Electronically Signed   By: Lucienne Capers M.D.   On: 08/25/2017 06:57   Mr Brain Wo Contrast  Result Date: 08/22/2017 IMPRESSION: 1. No acute abnormality. 2. Atrophy greater than expected for age. Minimal small vessel disease. Electronically Signed   By: Ulyses Jarred M.D.   On: 08/22/2017 22:13   Mr Thoracic Spine Wo Contrast  Result Date: 08/23/2017 IMPRESSION: Unremarkable thoracic spine MRI. No thoracic spine vertebral body abnormality, significant disc protrusion, or evidence of spinal infection. BILATERAL pleural effusions, with unusual tubular 3 cm density layering posteriorly on the LEFT. Consider CT chest with contrast for further evaluation. Electronically Signed   By: Staci Righter M.D.   On: 08/23/2017 14:22   Dg Chest Port 1 View  Result Date: 08/25/2017 IMPRESSION: Vascular congestion and mild cardiomegaly. Increased interstitial markings may reflect mild interstitial edema. Small bilateral pleural effusions suspected. Electronically Signed   By: Garald Balding M.D.   On: 08/25/2017 18:38    Assessment and Plan:   1. NSTEMI/CAD: -Currently chest pain free -Troponin peaked at 3.02, now down trending -Heparin gtt -ASA and Plavix -TTE -Will need cath when can lay supine (currently unable 2/2 dyspnea and back pain) -Lopressor -Lipitor -Check lipid and A1c for further risk stratification  -Wean nitro gtt and transition to Imdur  2. Acute on chronic diastolic CHF: -Volume managed by HD -Remains volume up -May need several more days of HD prior to [facility]  3. HTN: -Improving -Add Imdur as nitro gtt is being weaned -Continue Lopressor  4. HLD: -Check lipids -Lipitor  5. ESRD: -On HD  6. Anemia of chronic disease: -Stable    For questions  or updates, please contact [place] Please consult [email or web address] for contact info under Cardiology/STEMI.   Signed, Christell Faith, PA-C [facility] Pager: [phone] 08/26/2017, 10:19 AM

## 2017-08-26 NOTE — Progress Notes (Signed)
ANTICOAGULATION CONSULT NOTE - Initial Consult  Pharmacy Consult for heparin infusion Indication: chest pain/ACS  Allergies  Allergen Reactions  . Cephalosporins Anaphylaxis    Patient has tolerated meropenem.   [name] Penicillins Anaphylaxis and Other (See Comments)    Has patient had a PCN reaction causing immediate rash, facial/tongue/throat swelling, SOB or lightheadedness with hypotension: Yes Has patient had a PCN reaction causing severe rash involving mucus membranes or skin necrosis: No Has patient had a PCN reaction that required hospitalization No Has patient had a PCN reaction occurring within the last 10 years: No If all of the above answers are "NO", then may proceed with Cephalosporin use.  . Reglan [Metoclopramide] Other (See Comments)    Severe EPS (lip, head, body tremors) March 2018  . Risperdal [Risperidone] Other (See Comments)    Severe EPS (lip, head, body tremors) March 2018  . Lamictal [Lamotrigine] Other (See Comments)    Reaction:  Hallucinations  . Phenergan [Promethazine Hcl] Nausea And Vomiting    Sensitivity to medicine  . Pravastatin Other (See Comments)    Reaction:  Muscle pain   . Sulfa Antibiotics Other (See Comments)    Reaction:  Unknown     Patient Measurements: Height: 5\' 9"  (175.3 cm) Weight: 244 lb 0.8 oz (110.7 kg) IBW/kg (Calculated) : 66.2 Heparin Dosing Weight: 91.1 kg  Vital Signs: Temp: 97.8 F (36.6 C) (05/28 0000) Temp Source: Axillary (05/28 0000) BP: 156/72 (05/28 0000) Pulse Rate: 65 (05/28 0210)  Labs: Recent Labs    08/23/17 0522 08/25/17 1601 08/25/17 1815 08/25/17 2236 08/26/17 0308  HGB  --   --  11.0*  --  10.0*  HCT  --   --  33.7*  --  30.5*  PLT  --   --  239  --  217  APTT  --   --  36  --   --   LABPROT  --   --  13.1  --   --   INR  --   --  1.00  --   --   HEPARINUNFRC  --   --   --   --  0.27*  CREATININE 4.46*  --   --   --   --   TROPONINI  --  3.02* 2.84* 2.45*  --     Estimated Creatinine  Clearance: 19.3 mL/min (A) (by C-G formula based on SCr of 4.46 mg/dL (H)).   Medical History: Past Medical History:  Diagnosis Date  . Anginal pain (HCC)   . Anxiety   . Bipolar disorder (HCC)   . CAD (coronary artery disease)   . CHF (congestive heart failure) (HCC)   . Chronic lower back pain   . Depression   . Endometriosis   . ESRD (end stage renal disease) on dialysis (HCC)    "[facility]; [address]; [place]; TTS" (01/19/2016)  . Gastroparesis   . GERD (gastroesophageal reflux disease)   . History of blood transfusion "several"   "my blood would get low; low RBC"  . History of hiatal hernia   . HLD (hyperlipidemia)   . Hypertension   . Migraine    "monthly" (01/19/2016)  . Myocardial infarction (HCC) 2017   "~ 3 wks ago" (01/19/2016)  . Renal disorder   . Renal insufficiency   . Seizures (HCC) 07/2015   "I've only had the 1; don't know what from" (01/19/2016)  . Stroke (HCC)   . Type II diabetes mellitus (HCC)     Medications:  Infusions:  . aztreonam Stopped (08/25/17 1949)  . heparin 1,250 Units/hr (08/26/17 0000)  . nitroGLYCERIN 40 mcg/min (08/26/17 0000)    Assessment: 55 yof with extensive PMH including CAD s/p PCI with 2 stents 10 years ago, CHF, ESRD on HD, PVD, right BKA, HTN, DM, and seizure disorder. Had CP at end of HD session today, initial troponin 3.02. Pharmacy consulted to dose heparin for ACS. No PTA OAC noted. APTT not evaluated since 5/21. Will check baseline aPTT and PT/INR. Last CBC 5/24, will check that, too. Patient received several doses of LDUH - will not use initial UFH bolus.  Goal of Therapy:  Heparin level 0.3-0.7 units/ml Monitor platelets by anticoagulation protocol: Yes   Plan:  Start heparin infusion at 1250 units/hr Check anti-Xa level in 8 hours and daily while on heparin Continue to monitor H&H and platelets   5/28 0300 heparin level 0.27. 1400 unit bolus and increase rate to 1450 units/hr. Recheck in 8  hours.  Jamesyn Lindell S, Pharm.D., BCPS Clinical Pharmacist 08/26/2017,3:40 AM

## 2017-08-26 NOTE — Progress Notes (Signed)
*  PRELIMINARY RESULTS* Echocardiogram 2D Echocardiogram has been performed.  Joanette Gula Avari Gelles 08/26/2017, 10:07 AM

## 2017-08-26 NOTE — Progress Notes (Signed)
Patient ID: Kimberly Montgomery, female   DOB: [birth year], 52 y.o.   MRN: [id number]     [facility] PROGRESS NOTE  Kimberly Montgomery:[id number] DOB: January 21, 1966 DOA: 08/21/2017 PCP: Dorothey Baseman, MD  HPI/Subjective: Patient feeling better than yesterday afternoon.  She is able to breathe a little bit better.  Still having a little shortness of breath.  No further chest pain.  Objective: Vitals:   08/26/17 1330 08/26/17 1400  BP: (!) 152/62 (!) 161/64  Pulse: 82 81  Resp:    Temp:  98.3 F (36.8 C)  SpO2: 98% 99%    Filed Weights   08/25/17 1450 08/25/17 1659 08/26/17 0404  Weight: 111 kg (244 lb 11.4 oz) 110.7 kg (244 lb 0.8 oz) 109.1 kg (240 lb 8.4 oz)    ROS: Review of Systems  Constitutional: Negative for chills and fever.  Eyes: Negative for blurred vision.  Respiratory: Positive for shortness of breath. Negative for cough.   Cardiovascular: Negative for chest pain.  Gastrointestinal: Negative for abdominal pain, constipation, diarrhea, nausea and vomiting.  Genitourinary: Negative for dysuria.  Musculoskeletal: Negative for joint pain.  Neurological: Negative for dizziness and headaches.   Exam: Physical Exam  Constitutional: She is oriented to person, place, and time.  HENT:  Nose: No mucosal edema.  Mouth/Throat: No oropharyngeal exudate or posterior oropharyngeal edema.  Eyes: Pupils are equal, round, and reactive to light. Conjunctivae and lids are normal.  Neck: Carotid bruit is not present.  Cardiovascular: Regular rhythm, S1 normal, S2 normal and normal heart sounds.  Pulses:      Dorsalis pedis pulses are 2+ on the left side.  Respiratory: No accessory muscle usage. She has decreased breath sounds in the right lower field and the left lower field. She has no wheezes. She has no rhonchi. She has no rales.  GI: Soft. Bowel sounds are normal. There is no tenderness.  Musculoskeletal:       Right knee: She exhibits no swelling.       Left ankle: She  exhibits swelling.  Lymphadenopathy:    She has no cervical adenopathy.  Neurological: She is alert and oriented to person, place, and time.  Skin: Skin is warm. No rash noted. Nails show no clubbing.  Psychiatric: She has a normal mood and affect.      Data Reviewed: Basic Metabolic Panel: Recent Labs  Lab 08/21/17 2236 08/22/17 0633 08/22/17 1413 08/23/17 0522  NA 131* 129*  --  136  K 6.2* 5.9*  --  4.3  CL 93* 93*  --  96*  CO2 26 26  --  28  GLUCOSE 90 99  --  100*  BUN 37* 40*  --  19  CREATININE 6.39* 6.83*  --  4.46*  CALCIUM 9.1 8.6*  --  8.5*  MG  --  2.3  --  1.8  PHOS  --  5.9* 5.6* 4.7*   Liver Function Tests: Recent Labs  Lab 08/21/17 2236 08/22/17 0633 08/23/17 0522  AST 15  --   --   ALT 9*  --   --   ALKPHOS 151*  --   --   BILITOT 0.5  --   --   PROT 7.5  --   --   ALBUMIN 3.7 3.3* 3.3*    Recent Labs  Lab 08/21/17 2201 08/22/17 0633  AMMONIA 35 26   CBC: Recent Labs  Lab 08/21/17 2236 08/22/17 0633 08/25/17 1815 08/26/17 0308  WBC 6.0 4.8 9.8 6.6  HGB 12.4 11.6* 11.0* 10.0*  HCT 38.4 34.8* 33.7* 30.5*  MCV 88.5 87.5 88.0 88.2  PLT 260 200 239 217   Cardiac Enzymes: Recent Labs  Lab 08/21/17 2236 08/25/17 1601 08/25/17 1815 08/25/17 2236  TROPONINI <0.03 3.02* 2.84* 2.45*   BNP (last 3 results) Recent Labs    08/21/17 2236  BNP 1,509.0*     CBG: Recent Labs  Lab 08/25/17 1944 08/25/17 2341 08/26/17 0351 08/26/17 0742 08/26/17 1152  GLUCAP 181* 208* 146* 135* 201*    Recent Results (from the past 240 hour(s))  MRSA PCR Screening     Status: None   Collection Time: 08/22/17  2:41 AM  Result Value Ref Range Status   MRSA by PCR NEGATIVE NEGATIVE Final    Comment:        The GeneXpert MRSA Assay (FDA approved for NASAL specimens only), is one component of a comprehensive MRSA colonization surveillance program. It is not intended to diagnose MRSA infection nor to guide or monitor treatment for MRSA  infections. Performed at [facility], [address] [place], [place] [address]   Urine Culture     Status: Abnormal   Collection Time: 08/22/17 11:30 AM  Result Value Ref Range Status   Specimen Description   Final    URINE, RANDOM Performed at [facility], [address] [place], [place] [address]    Special Requests   Final    NONE Performed at [facility], [address] [place], [place] [address]    Culture >=100,000 COLONIES/mL ENTEROCOCCUS GALLINARUM (A)  Final   Report Status 08/25/2017 FINAL  Final   Organism ID, Bacteria ENTEROCOCCUS GALLINARUM (A)  Final      Susceptibility   Enterococcus gallinarum - MIC*    AMPICILLIN <=2 SENSITIVE Sensitive     LEVOFLOXACIN >=8 RESISTANT Resistant     NITROFURANTOIN <=16 SENSITIVE Sensitive     VANCOMYCIN RESISTANT Resistant     LINEZOLID 2 SENSITIVE Sensitive     * >=100,000 COLONIES/mL ENTEROCOCCUS GALLINARUM  Culture, blood (routine x 2)     Status: None (Preliminary result)   Collection Time: 08/22/17  3:01 PM  Result Value Ref Range Status   Specimen Description BLOOD HEMODIALYSIS FISTULA  Final   Special Requests   Final    BOTTLES DRAWN AEROBIC AND ANAEROBIC Blood Culture results may not be optimal due to an excessive volume of blood received in culture bottles   Culture   Final    NO GROWTH 4 DAYS Performed at [facility], [address], [place], [place] [address]    Report Status PENDING  Incomplete  Culture, blood (routine x 2)     Status: None (Preliminary result)   Collection Time: 08/22/17  4:11 PM  Result Value Ref Range Status   Specimen Description BLOOD HEMODIALYSIS FISTULA  Final   Special Requests   Final    BOTTLES DRAWN AEROBIC AND ANAEROBIC Blood Culture results may not be optimal due to an excessive volume of blood received in culture bottles   Culture   Final    NO GROWTH 4 DAYS Performed at [facility], [address], [place], [place]  [address]    Report Status PENDING  Incomplete     Studies: Ct Angio Chest Pe W Or Wo Contrast  Result Date: 08/25/2017 CLINICAL DATA:  Acute hypoxic respiratory failure. Shortness of breath. EXAM: CT ANGIOGRAPHY CHEST WITH CONTRAST TECHNIQUE: Multidetector CT imaging of the chest was performed using the  standard protocol during bolus administration of intravenous contrast. Multiplanar CT image reconstructions and MIPs were obtained to evaluate the vascular anatomy. CONTRAST:  75mL ISOVUE-370 IOPAMIDOL (ISOVUE-370) INJECTION 76% COMPARISON:  Chest radiograph 08/21/2017 FINDINGS: Cardiovascular: Moderately good opacification of central and segmental pulmonary arteries. No focal filling defects are identified. No evidence of significant pulmonary embolus. Mild cardiac enlargement. No pericardial effusions. Coronary artery calcifications. Normal caliber thoracic aorta with scattered calcification. Mediastinum/Nodes: Esophagus is decompressed. Moderately prominent lymph nodes throughout the upper mediastinum. Pretracheal lymph nodes measure up to 2.2 cm diameter. Changes are nonspecific in could represent reactive lymph nodes, metastatic nodes, or lymphoproliferative changes. Lungs/Pleura: Motion artifact limits evaluation. Small to moderate bilateral pleural effusions with basilar atelectasis. Hazy mosaic appearance to the lung parenchyma may represent motion artifact, edema, or air trapping. No pneumothorax. Airways are patent. Upper Abdomen: No acute abnormality. Musculoskeletal: Degenerative changes in the spine. No destructive bone lesions. Review of the MIP images confirms the above findings. IMPRESSION: 1. No evidence of significant pulmonary embolus. 2. Probable congestive changes with cardiac enlargement, bilateral pleural effusions, and mosaic attenuation pattern to the lungs which may indicate edema or motion artifact. 3. Mediastinal lymphadenopathy of nonspecific etiology. 4. Atelectasis in the lung  bases. 5. Technically limited examination due to motion artifact. Aortic Atherosclerosis (ICD10-I70.0). Electronically Signed   By: Burman Nieves M.D.   On: 08/25/2017 06:57   Dg Chest Port 1 View  Result Date: 08/25/2017 CLINICAL DATA:  Acute onset of shortness of breath. EXAM: PORTABLE CHEST 1 VIEW COMPARISON:  Chest radiograph performed 08/21/2017, and CTA of the chest performed earlier today at 6:34 a.m. FINDINGS: The lungs are well-aerated. Vascular congestion is noted. Increased interstitial markings may reflect mild interstitial edema. Small bilateral pleural effusions are suspected. There is no evidence of pneumothorax. The cardiomediastinal silhouette is mildly enlarged. No acute osseous abnormalities are seen. IMPRESSION: Vascular congestion and mild cardiomegaly. Increased interstitial markings may reflect mild interstitial edema. Small bilateral pleural effusions suspected. Electronically Signed   By: Roanna Raider M.D.   On: 08/25/2017 18:38    Scheduled Meds: . aspirin  81 mg Oral Daily  . atorvastatin  40 mg Oral q1800  . budesonide (PULMICORT) nebulizer solution  0.5 mg Nebulization BID  . clopidogrel  75 mg Oral Q1200  . docusate sodium  100 mg Oral BID  . epoetin (EPOGEN/PROCRIT) injection  4,000 Units Intravenous Q M,W,F-HD  . escitalopram  2.5 mg Oral Daily  . fluticasone  1 spray Each Nare Daily  . gabapentin  300 mg Oral Daily  . hydrALAZINE  50 mg Oral 3 times per day on Sun Tue Thu Sat   And  . [START ON 08/27/2017] hydrALAZINE  50 mg Oral 2 times per day on Mon Wed Fri  . insulin aspart  0-9 Units Subcutaneous Q4H  . ipratropium-albuterol  3 mL Nebulization Q6H  . isosorbide mononitrate  30 mg Oral Once  . [START ON 08/27/2017] isosorbide mononitrate  90 mg Oral Daily  . lactulose  10 g Oral q1800  . levETIRAcetam  1,500 mg Oral q1800  . lidocaine  1 patch Transdermal QHS  . linezolid  600 mg Oral Q12H  . mouth rinse  15 mL Mouth Rinse BID  . Melatonin  5 mg  Oral QHS  . metoprolol tartrate  50 mg Oral BID  . multivitamin with minerals  1 tablet Oral Daily  . OXcarbazepine  150 mg Oral QHS  . pantoprazole  40 mg Oral Daily  . polyethylene  glycol  17 g Oral Q1200  . polyvinyl alcohol  1 drop Both Eyes TID  . sevelamer carbonate  800 mg Oral TID AC & HS   Continuous Infusions: . aztreonam Stopped (08/26/17 0939)  . heparin 1,450 Units/hr (08/26/17 1002)  . nitroGLYCERIN 180 mcg/min (08/26/17 1406)    Assessment/Plan:  1. NSTEMI.  Currently on nitro drip and heparin drip.  On aspirin and atorvastatin.  Already on metoprolol.  Cardiac catheterization once able to lie flat. 2. Acute hypoxic respiratory failure.  Yesterday afternoon needed to be transferred to the ICU for BiPAP.  Patient currently on 3 L of oxygen.  Since the patient does not wear oxygen at home hopefully will be able to taper off.   3. Acute on chronic diastolic congestive heart failure.  Patient given a dose of Lasix yesterday.  Continue hemodialysis to remove fluid.  Can consider restarting Lasix on nondialysis days. 4. Acute metabolic encephalopathy.  Unclear etiology but patient has improved since she came in.  MRI of the brain was negative for stroke.  Urine culture positive for enterococcus and patient on Zyvox. 5. Lymphadenopathy in the chest.  Refer to heme-onc as outpatient. 6. end-stage renal disease.  Hemodialysis  Yesterday 7. Enterococcus UTI on Zyvox 8. Hyperkalemia on presentation. 9. Type 2 diabetes mellitus on sliding scale insulin 10. History of seizure on Keppra 11. Hyperlipidemia unspecified on atorvastatin 12. GERD on Protonix 13. Obesity weight loss needed  Code Status:     Code Status Orders  (From admission, onward)        Start     Ordered   08/22/17 0212  Full code  Continuous     08/22/17 0211    Code Status History    Date Active Date Inactive Code Status Order ID Comments User Context   07/01/2017 1654 07/03/2017 0404 Full Code [id number]   Enedina Finner, MD Inpatient   05/15/2017 0540 05/21/2017 1406 Full Code [id number]  Ihor Austin, MD Inpatient   05/31/2016 1606 06/11/2016 2013 Full Code [id number]  Kathlene Cote, PA-C ED   03/13/2016 2236 03/16/2016 2033 Full Code [id number]  Oralia Manis, MD ED   02/25/2016 1758 02/27/2016 2332 Full Code [id number]  Shaune Pollack, MD Inpatient   01/19/2016 0454 01/20/2016 2103 Full Code [id number]  Hillary Bow, DO ED   12/25/2015 2001 01/04/2016 2024 Full Code [id number]  Houston Siren, MD Inpatient   11/19/2015 0117 11/21/2015 1956 Full Code [id number]  Hugelmeyer, [place], DO Inpatient   10/06/2015 0208 10/06/2015 1832 Full Code [id number]  Ihor Austin, MD Inpatient   09/13/2015 0214 09/15/2015 1728 Full Code [id number]  Arnaldo Natal, MD Inpatient   08/24/2015 0350 08/31/2015 1847 Full Code [id number]  Ihor Austin, MD Inpatient   07/16/2015 0626 08/02/2015 1848 Full Code [id number]  Ihor Austin, MD ED   07/10/2015 0149 07/12/2015 1301 Full Code [id number]  Oralia Manis, MD Inpatient   05/12/2015 1546 05/16/2015 1735 Full Code [id number]  Auburn Bilberry, MD ED     Family Communication: Spoke with daughter at the bedside Disposition Plan:  to be determined based on cardiac catheterization  Consultants:  Critical care specialist   cardiology  Nephrology  Time spent: 28 minutes  Anelisse Jacobson [employer]

## 2017-08-26 NOTE — Progress Notes (Signed)
[facility] [place], [place] 08/26/17  Subjective:  Patient completed hemodialysis yesterday. She developed chest pain thereafter. Was found to be risen to 2.45. Cardiology has seen the patient and is planning cardiac catheterization tomorrow prior to dialysis.  Objective:  Vital signs in last 24 hours:  Temp:  [97.8 F (36.6 C)-99.1 F (37.3 C)] 98.2 F (36.8 C) (05/28 0700) Pulse Rate:  [63-100] 82 (05/28 0930) Resp:  [10-37] 22 (05/28 0930) BP: (145-220)/(58-95) 166/71 (05/28 0930) SpO2:  [94 %-100 %] 97 % (05/28 0930) FiO2 (%):  [30 %-100 %] 30 % (05/28 0210) Weight:  [109.1 kg (240 lb 8.4 oz)-111 kg (244 lb 11.4 oz)] 109.1 kg (240 lb 8.4 oz) (05/28 0404)  Weight change: 0.922 kg (2 lb 0.5 oz) Filed Weights   08/25/17 1450 08/25/17 1659 08/26/17 0404  Weight: 111 kg (244 lb 11.4 oz) 110.7 kg (244 lb 0.8 oz) 109.1 kg (240 lb 8.4 oz)    Intake/Output:    Intake/Output Summary (Last 24 hours) at 08/26/2017 1046 Last data filed at 08/26/2017 0400 Gross per 24 hour  Intake 173.98 ml  Output 1181 ml  Net -1007.02 ml     Physical Exam: General: Chronically ill appearing   HEENT Ancteric  Neck supple  Pulm/lungs Scattered rhonchi, normal effort  CVS/Heart Regular rhythm  Abdomen:  Soft, NTND  Extremities: Rt BKA  Neurologic: Alert, able to follow commands and answer questions  Skin: No acute rashes  Access: Rt Upper arm AVF       Basic Metabolic Panel:  Recent Labs  Lab 08/21/17 2236 08/22/17 0633 08/22/17 1413 08/23/17 0522  NA 131* 129*  --  136  K 6.2* 5.9*  --  4.3  CL 93* 93*  --  96*  CO2 26 26  --  28  GLUCOSE 90 99  --  100*  BUN 37* 40*  --  19  CREATININE 6.39* 6.83*  --  4.46*  CALCIUM 9.1 8.6*  --  8.5*  MG  --  2.3  --  1.8  PHOS  --  5.9* 5.6* 4.7*     CBC: Recent Labs  Lab 08/21/17 2236 08/22/17 0633 08/25/17 1815 08/26/17 0308  WBC 6.0 4.8 9.8 6.6  HGB 12.4 11.6* 11.0* 10.0*  HCT 38.4 34.8* 33.7* 30.5*   MCV 88.5 87.5 88.0 88.2  PLT 260 200 239 217      Lab Results  Component Value Date   HEPBSAG Negative 06/01/2016      Microbiology:  Recent Results (from the past 240 hour(s))  MRSA PCR Screening     Status: None   Collection Time: 08/22/17  2:41 AM  Result Value Ref Range Status   MRSA by PCR NEGATIVE NEGATIVE Final    Comment:        The GeneXpert MRSA Assay (FDA approved for NASAL specimens only), is one component of a comprehensive MRSA colonization surveillance program. It is not intended to diagnose MRSA infection nor to guide or monitor treatment for MRSA infections. Performed at [facility], [address] [place], [place] [address]   Urine Culture     Status: Abnormal   Collection Time: 08/22/17 11:30 AM  Result Value Ref Range Status   Specimen Description   Final    URINE, RANDOM Performed at [facility], [address], [place], [place] [address]    Special Requests   Final    NONE Performed at [facility], [address] [place], [place]  [address]    Culture >=100,000 COLONIES/mL ENTEROCOCCUS GALLINARUM (A)  Final   Report Status 08/25/2017 FINAL  Final   Organism ID, Bacteria ENTEROCOCCUS GALLINARUM (A)  Final      Susceptibility   Enterococcus gallinarum - MIC*    AMPICILLIN <=2 SENSITIVE Sensitive     LEVOFLOXACIN >=8 RESISTANT Resistant     NITROFURANTOIN <=16 SENSITIVE Sensitive     VANCOMYCIN RESISTANT Resistant     LINEZOLID 2 SENSITIVE Sensitive     * >=100,000 COLONIES/mL ENTEROCOCCUS GALLINARUM  Culture, blood (routine x 2)     Status: None (Preliminary result)   Collection Time: 08/22/17  3:01 PM  Result Value Ref Range Status   Specimen Description BLOOD HEMODIALYSIS FISTULA  Final   Special Requests   Final    BOTTLES DRAWN AEROBIC AND ANAEROBIC Blood Culture results may not be optimal due to an excessive volume of blood received in culture bottles   Culture   Final    NO GROWTH 4  DAYS Performed at [facility] [place] [facility], [address] [place], [place] [address]    Report Status PENDING  Incomplete  Culture, blood (routine x 2)     Status: None (Preliminary result)   Collection Time: 08/22/17  4:11 PM  Result Value Ref Range Status   Specimen Description BLOOD HEMODIALYSIS FISTULA  Final   Special Requests   Final    BOTTLES DRAWN AEROBIC AND ANAEROBIC Blood Culture results may not be optimal due to an excessive volume of blood received in culture bottles   Culture   Final    NO GROWTH 4 DAYS Performed at [facility], [address] [place], [place] [address]    Report Status PENDING  Incomplete    Coagulation Studies: Recent Labs    08/25/17 1815  LABPROT 13.1  INR 1.00    Urinalysis: No results for input(s): COLORURINE, LABSPEC, PHURINE, GLUCOSEU, HGBUR, BILIRUBINUR, KETONESUR, PROTEINUR, UROBILINOGEN, NITRITE, LEUKOCYTESUR in the last 72 hours.  Invalid input(s): APPERANCEUR    Imaging: Ct Angio Chest Pe W Or Wo Contrast  Result Date: 08/25/2017 CLINICAL DATA:  Acute hypoxic respiratory failure. Shortness of breath. EXAM: CT ANGIOGRAPHY CHEST WITH CONTRAST TECHNIQUE: Multidetector CT imaging of the chest was performed using the standard protocol during bolus administration of intravenous contrast. Multiplanar CT image reconstructions and MIPs were obtained to evaluate the vascular anatomy. CONTRAST:  38mL ISOVUE-370 IOPAMIDOL (ISOVUE-370) INJECTION 76% COMPARISON:  Chest radiograph 08/21/2017 FINDINGS: Cardiovascular: Moderately good opacification of central and segmental pulmonary arteries. No focal filling defects are identified. No evidence of significant pulmonary embolus. Mild cardiac enlargement. No pericardial effusions. Coronary artery calcifications. Normal caliber thoracic aorta with scattered calcification. Mediastinum/Nodes: Esophagus is decompressed. Moderately prominent lymph nodes throughout the upper mediastinum. Pretracheal  lymph nodes measure up to 2.2 cm diameter. Changes are nonspecific in could represent reactive lymph nodes, metastatic nodes, or lymphoproliferative changes. Lungs/Pleura: Motion artifact limits evaluation. Small to moderate bilateral pleural effusions with basilar atelectasis. Hazy mosaic appearance to the lung parenchyma may represent motion artifact, edema, or air trapping. No pneumothorax. Airways are patent. Upper Abdomen: No acute abnormality. Musculoskeletal: Degenerative changes in the spine. No destructive bone lesions. Review of the MIP images confirms the above findings. IMPRESSION: 1. No evidence of significant pulmonary embolus. 2. Probable congestive changes with cardiac enlargement, bilateral pleural effusions, and mosaic attenuation pattern to the lungs which may indicate edema or motion artifact. 3. Mediastinal lymphadenopathy of nonspecific etiology. 4. Atelectasis in the lung bases. 5. Technically limited examination  due to motion artifact. Aortic Atherosclerosis (ICD10-I70.0). Electronically Signed   By: Burman Nieves M.D.   On: 08/25/2017 06:57   Dg Chest Port 1 View  Result Date: 08/25/2017 CLINICAL DATA:  Acute onset of shortness of breath. EXAM: PORTABLE CHEST 1 VIEW COMPARISON:  Chest radiograph performed 08/21/2017, and CTA of the chest performed earlier today at 6:34 a.m. FINDINGS: The lungs are well-aerated. Vascular congestion is noted. Increased interstitial markings may reflect mild interstitial edema. Small bilateral pleural effusions are suspected. There is no evidence of pneumothorax. The cardiomediastinal silhouette is mildly enlarged. No acute osseous abnormalities are seen. IMPRESSION: Vascular congestion and mild cardiomegaly. Increased interstitial markings may reflect mild interstitial edema. Small bilateral pleural effusions suspected. Electronically Signed   By: Roanna Raider M.D.   On: 08/25/2017 18:38     Medications:   . aztreonam Stopped (08/26/17 0939)  .  heparin 1,450 Units/hr (08/26/17 1002)  . nitroGLYCERIN 90 mcg/min (08/26/17 1001)   . aspirin  81 mg Oral Daily  . atorvastatin  40 mg Oral q1800  . budesonide (PULMICORT) nebulizer solution  0.5 mg Nebulization BID  . clopidogrel  75 mg Oral Q1200  . docusate sodium  100 mg Oral BID  . epoetin (EPOGEN/PROCRIT) injection  4,000 Units Intravenous Q M,W,F-HD  . escitalopram  2.5 mg Oral Daily  . fluticasone  1 spray Each Nare Daily  . gabapentin  300 mg Oral Daily  . insulin aspart  0-9 Units Subcutaneous Q4H  . ipratropium-albuterol  3 mL Nebulization Q6H  . isosorbide mononitrate  60 mg Oral Daily  . lactulose  10 g Oral q1800  . levETIRAcetam  1,500 mg Oral q1800  . lidocaine  1 patch Transdermal QHS  . linezolid  600 mg Oral Q12H  . mouth rinse  15 mL Mouth Rinse BID  . Melatonin  5 mg Oral QHS  . metoprolol tartrate  50 mg Oral BID  . multivitamin with minerals  1 tablet Oral Daily  . OXcarbazepine  150 mg Oral QHS  . pantoprazole  40 mg Oral Daily  . polyethylene glycol  17 g Oral Q1200  . polyvinyl alcohol  1 drop Both Eyes TID  . sevelamer carbonate  800 mg Oral TID AC & HS   acetaminophen **OR** acetaminophen, benztropine, bisacodyl, bisacodyl, clonazePAM **AND** [DISCONTINUED] clonazePAM, hydrALAZINE, HYDROcodone-acetaminophen, lip balm, nitroGLYCERIN, ondansetron **OR** ondansetron (ZOFRAN) IV, sodium phosphate, traZODone  Assessment/ Plan:  52 y.o. caucaisan female  female with end stage renal disease on hemodialysis, COPD/asthma, diabetes mellitus type II, congestive heart failure, coronary artery disease. 5/24- admitted for altered mental status   [facility] [address]/ MWF 107kg Right AVF  1. End Stage Renal Disease with hyperkalemia -Patient completed hemodialysis yesterday.  No dialysis today.  We will have her dialysis tomorrow.  2. 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  Maintain the patient on Epogen.  3. SHPTH -Recheck serum phosphorus tomorrow.   Maintain the patient on Renvela otherwise.  4. Altered mental status and back pain -Brain MRI was negative for acute CVA.  Thoracic MRI also normal.  5.  NSTEMI..  Cardiac catheterization tomorrow.  She has known coronary artery disease.    LOS: 4 Mady Haagensen 5/28/201910:46 AM  [facility] [place], [place] [phone]  Note: This note was prepared with Dragon dictation. Any transcription errors are unintentional

## 2017-08-26 NOTE — Progress Notes (Signed)
Spoke with Samaan regarding patients Blood pressure. He will add additional po medication.

## 2017-08-26 NOTE — Progress Notes (Signed)
ANTICOAGULATION CONSULT NOTE - Initial Consult  Pharmacy Consult for heparin infusion Indication: chest pain/ACS  Allergies  Allergen Reactions  . Cephalosporins Anaphylaxis    Patient has tolerated meropenem.   [name] Penicillins Anaphylaxis and Other (See Comments)    Has patient had a PCN reaction causing immediate rash, facial/tongue/throat swelling, SOB or lightheadedness with hypotension: Yes Has patient had a PCN reaction causing severe rash involving mucus membranes or skin necrosis: No Has patient had a PCN reaction that required hospitalization No Has patient had a PCN reaction occurring within the last 10 years: No If all of the above answers are "NO", then may proceed with Cephalosporin use.  . Reglan [Metoclopramide] Other (See Comments)    Severe EPS (lip, head, body tremors) March 2018  . Risperdal [Risperidone] Other (See Comments)    Severe EPS (lip, head, body tremors) March 2018  . Lamictal [Lamotrigine] Other (See Comments)    Reaction:  Hallucinations  . Phenergan [Promethazine Hcl] Nausea And Vomiting    Sensitivity to medicine  . Pravastatin Other (See Comments)    Reaction:  Muscle pain   . Sulfa Antibiotics Other (See Comments)    Reaction:  Unknown     Patient Measurements: Height: 5\' 9"  (175.3 cm) Weight: 240 lb 8.4 oz (109.1 kg) IBW/kg (Calculated) : 66.2 Heparin Dosing Weight: 91.1 kg  Vital Signs: Temp: 98.3 F (36.8 C) (05/28 1900) Temp Source: Oral (05/28 1900) BP: 164/72 (05/28 1900) Pulse Rate: 77 (05/28 1910)  Labs: Recent Labs    08/25/17 1815 08/25/17 2236 08/26/17 0308 08/26/17 1133 08/26/17 1840 08/26/17 2003  HGB 11.0*  --  10.0*  --   --   --   HCT 33.7*  --  30.5*  --   --   --   PLT 239  --  217  --   --   --   APTT 36  --   --   --   --   --   LABPROT 13.1  --   --   --   --   --   INR 1.00  --   --   --   --   --   HEPARINUNFRC  --   --  0.27* 0.33  --  0.22*  TROPONINI 2.84* 2.45*  --   --  1.86*  --     Estimated  Creatinine Clearance: 19.2 mL/min (A) (by C-G formula based on SCr of 4.46 mg/dL (H)).   Medical History: Past Medical History:  Diagnosis Date  . Anginal pain (HCC)   . Anxiety   . Bipolar disorder (HCC)   . CAD (coronary artery disease)   . CHF (congestive heart failure) (HCC)   . Chronic lower back pain   . Depression   . Endometriosis   . ESRD (end stage renal disease) on dialysis (HCC)    "[facility]; [address]; [place]; TTS" (01/19/2016)  . Gastroparesis   . GERD (gastroesophageal reflux disease)   . History of blood transfusion "several"   "my blood would get low; low RBC"  . History of hiatal hernia   . HLD (hyperlipidemia)   . Hypertension   . Migraine    "monthly" (01/19/2016)  . Myocardial infarction (HCC) 2017   "~ 3 wks ago" (01/19/2016)  . Renal disorder   . Renal insufficiency   . Seizures (HCC) 07/2015   "I've only had the 1; don't know what from" (01/19/2016)  . Stroke (HCC)   . Type II diabetes mellitus (  HCC)     Medications:  Infusions:  . aztreonam Stopped (08/26/17 1854)  . heparin 1,450 Units/hr (08/26/17 1900)  . nitroGLYCERIN 200 mcg/min (08/26/17 1900)    Assessment: 89 yof with extensive PMH including CAD s/p PCI with 2 stents 10 years ago, CHF, ESRD on HD, PVD, right BKA, HTN, DM, and seizure disorder. Had CP at end of HD session today, initial troponin 3.02. Pharmacy consulted to dose heparin for ACS.   Goal of Therapy:  Heparin level 0.3-0.7 units/ml Monitor platelets by anticoagulation protocol: Yes   Plan:  Will continue heparin drip at current rate and recheck a HL in 8 hours.   5/28: HL @ 20:03 = 0.22 Will order Heparin 1350 units IV X 1 and increase drip rate to 1650 units/hr. Will recheck HL 8 hrs after rate change.   Delayza Lungren D, Pharm.D.,  Clinical Pharmacist 08/26/2017,8:45 PM

## 2017-08-26 NOTE — Progress Notes (Signed)
Spoke with Dr Duanne Limerick. Patients BP 162/74. He will add Norvasc and orders to increase nitro to max

## 2017-08-26 NOTE — Progress Notes (Signed)
EKG performed and troponin. Patient states that she is no longer having crushing pain.

## 2017-08-26 NOTE — Progress Notes (Signed)
Called Dr Duanne Limerick. Patient states that she is having crushing pain mid chest. Per Dr.  Duanne Limerick Stat EKG and Troponin

## 2017-08-26 NOTE — Progress Notes (Signed)
Patient had consult done by [facility] and now asked to see [facility] as is our patient. Peak Troponin 3, and now trending down, with h/o PCI/stent of LAD and renal failure and on dialysis. Needs dialysis again tomorrow and will plan cath thursday.Will do right and left  heart cath once more fluid is removed and is able to lye flat probably by Thursday.ECHO had normal LVEF and EKG has no sst depression or elevation.

## 2017-08-27 ENCOUNTER — Inpatient Hospital Stay: Payer: Medicare HMO

## 2017-08-27 LAB — CBC WITH DIFFERENTIAL/PLATELET
BASOS ABS: 0 10*3/uL (ref 0–0.1)
BASOS PCT: 1 %
EOS ABS: 0.1 10*3/uL (ref 0–0.7)
Eosinophils Relative: 2 %
HCT: 28 % — ABNORMAL LOW (ref 35.0–47.0)
HEMOGLOBIN: 9 g/dL — AB (ref 12.0–16.0)
Lymphocytes Relative: 22 %
Lymphs Abs: 1.5 10*3/uL (ref 1.0–3.6)
MCH: 28.5 pg (ref 26.0–34.0)
MCHC: 32.3 g/dL (ref 32.0–36.0)
MCV: 88.1 fL (ref 80.0–100.0)
MONOS PCT: 7 %
Monocytes Absolute: 0.5 10*3/uL (ref 0.2–0.9)
NEUTROS ABS: 4.5 10*3/uL (ref 1.4–6.5)
NEUTROS PCT: 68 %
Platelets: 238 10*3/uL (ref 150–440)
RBC: 3.17 MIL/uL — AB (ref 3.80–5.20)
RDW: 16.6 % — ABNORMAL HIGH (ref 11.5–14.5)
WBC: 6.6 10*3/uL (ref 3.6–11.0)

## 2017-08-27 LAB — GLUCOSE, CAPILLARY
GLUCOSE-CAPILLARY: 126 mg/dL — AB (ref 65–99)
GLUCOSE-CAPILLARY: 129 mg/dL — AB (ref 65–99)
GLUCOSE-CAPILLARY: 133 mg/dL — AB (ref 65–99)
GLUCOSE-CAPILLARY: 156 mg/dL — AB (ref 65–99)
GLUCOSE-CAPILLARY: 172 mg/dL — AB (ref 65–99)
GLUCOSE-CAPILLARY: 226 mg/dL — AB (ref 65–99)

## 2017-08-27 LAB — CULTURE, BLOOD (ROUTINE X 2)
Culture: NO GROWTH
Culture: NO GROWTH

## 2017-08-27 LAB — HEPARIN LEVEL (UNFRACTIONATED)
HEPARIN UNFRACTIONATED: 0.45 [IU]/mL (ref 0.30–0.70)
Heparin Unfractionated: 0.34 IU/mL (ref 0.30–0.70)

## 2017-08-27 LAB — PHOSPHORUS: Phosphorus: 4.7 mg/dL — ABNORMAL HIGH (ref 2.5–4.6)

## 2017-08-27 NOTE — Progress Notes (Signed)
[facility], [place] 08/27/17  Subjective:  Patient seen at bedside. She will dialysis today. Orders have been prepared. Denies chest pain at the moment.  Objective:  Vital signs in last 24 hours:  Temp:  [97.9 F (36.6 C)-98.4 F (36.9 C)] 98.3 F (36.8 C) (05/29 0800) Pulse Rate:  [57-92] 63 (05/29 0800) Resp:  [16-26] 19 (05/29 0800) BP: (128-170)/(56-126) 156/60 (05/29 0800) SpO2:  [9 %-99 %] 96 % (05/29 0800) FiO2 (%):  [30 %] 30 % (05/29 0129) Weight:  [112.9 kg (248 lb 14.4 oz)] 112.9 kg (248 lb 14.4 oz) (05/29 0500)  Weight change: 1.3 kg (2 lb 13.9 oz) Filed Weights   08/25/17 1659 08/26/17 0404 08/27/17 0500  Weight: 110.7 kg (244 lb 0.8 oz) 109.1 kg (240 lb 8.4 oz) 112.9 kg (248 lb 14.4 oz)    Intake/Output:    Intake/Output Summary (Last 24 hours) at 08/27/2017 1041 Last data filed at 08/27/2017 0800 Gross per 24 hour  Intake 915.87 ml  Output 200 ml  Net 715.87 ml     Physical Exam: General: Chronically ill appearing   HEENT Ancteric  Neck supple  Pulm/lungs Scattered rhonchi, normal effort  CVS/Heart Regular rhythm  Abdomen:  Soft, NTND  Extremities: Rt BKA  Neurologic: Alert, able to follow commands and answer questions  Skin: No acute rashes  Access: Rt Upper arm AVF       Basic Metabolic Panel:  Recent Labs  Lab 08/21/17 2236 08/22/17 0633 08/22/17 1413 08/23/17 0522  NA 131* 129*  --  136  K 6.2* 5.9*  --  4.3  CL 93* 93*  --  96*  CO2 26 26  --  28  GLUCOSE 90 99  --  100*  BUN 37* 40*  --  19  CREATININE 6.39* 6.83*  --  4.46*  CALCIUM 9.1 8.6*  --  8.5*  MG  --  2.3  --  1.8  PHOS  --  5.9* 5.6* 4.7*     CBC: Recent Labs  Lab 08/21/17 2236 08/22/17 0633 08/25/17 1815 08/26/17 0308 08/27/17 0539  WBC 6.0 4.8 9.8 6.6 6.6  NEUTROABS  --   --   --   --  4.5  HGB 12.4 11.6* 11.0* 10.0* 9.0*  HCT 38.4 34.8* 33.7* 30.5* 28.0*  MCV 88.5 87.5 88.0 88.2 88.1  PLT 260 200 239 217 238       Lab Results  Component Value Date   HEPBSAG Negative 06/01/2016      Microbiology:  Recent Results (from the past 240 hour(s))  MRSA PCR Screening     Status: None   Collection Time: 08/22/17  2:41 AM  Result Value Ref Range Status   MRSA by PCR NEGATIVE NEGATIVE Final    Comment:        The GeneXpert MRSA Assay (FDA approved for NASAL specimens only), is one component of a comprehensive MRSA colonization surveillance program. It is not intended to diagnose MRSA infection nor to guide or monitor treatment for MRSA infections. Performed at [facility], [address] [place], [place] [address]   Urine Culture     Status: Abnormal   Collection Time: 08/22/17 11:30 AM  Result Value Ref Range Status   Specimen Description   Final    URINE, RANDOM Performed at [facility], [address], [place], [place] [address]    Special Requests   Final    NONE Performed at [facility], [address] [place], [place] [address]    Culture >=100,000 COLONIES/mL ENTEROCOCCUS GALLINARUM (A)  Final   Report Status 08/25/2017 FINAL  Final   Organism ID, Bacteria ENTEROCOCCUS GALLINARUM (A)  Final      Susceptibility   Enterococcus gallinarum - MIC*    AMPICILLIN <=2 SENSITIVE Sensitive     LEVOFLOXACIN >=8 RESISTANT Resistant     NITROFURANTOIN <=16 SENSITIVE Sensitive     VANCOMYCIN RESISTANT Resistant     LINEZOLID 2 SENSITIVE Sensitive     * >=100,000 COLONIES/mL ENTEROCOCCUS GALLINARUM  Culture, blood (routine x 2)     Status: None   Collection Time: 08/22/17  3:01 PM  Result Value Ref Range Status   Specimen Description BLOOD HEMODIALYSIS FISTULA  Final   Special Requests   Final    BOTTLES DRAWN AEROBIC AND ANAEROBIC Blood Culture results may not be optimal due to an excessive volume of blood received in culture bottles   Culture   Final    NO GROWTH 5 DAYS Performed at [facility], [address] [place], [place] [address]     Report Status 08/27/2017 FINAL  Final  Culture, blood (routine x 2)     Status: None   Collection Time: 08/22/17  4:11 PM  Result Value Ref Range Status   Specimen Description BLOOD HEMODIALYSIS FISTULA  Final   Special Requests   Final    BOTTLES DRAWN AEROBIC AND ANAEROBIC Blood Culture results may not be optimal due to an excessive volume of blood received in culture bottles   Culture   Final    NO GROWTH 5 DAYS Performed at [facility], [address], [place], [place] [address]    Report Status 08/27/2017 FINAL  Final    Coagulation Studies: Recent Labs    08/25/17 1815  LABPROT 13.1  INR 1.00    Urinalysis: No results for input(s): COLORURINE, LABSPEC, PHURINE, GLUCOSEU, HGBUR, BILIRUBINUR, KETONESUR, PROTEINUR, UROBILINOGEN, NITRITE, LEUKOCYTESUR in the last 72 hours.  Invalid input(s): APPERANCEUR    Imaging: Dg Chest Port 1 View  Result Date: 08/27/2017 CLINICAL DATA:  Pneumonia. EXAM: PORTABLE CHEST 1 VIEW COMPARISON:  Radiograph of Aug 25, 2017. FINDINGS: Stable cardiomegaly and central pulmonary vascular congestion is noted. No pneumothorax is noted. Mild bibasilar subsegmental atelectasis or edema is noted with possible small pleural effusions. Bony thorax is unremarkable. IMPRESSION: Stable cardiomegaly and central pulmonary vascular congestion. Mild bibasilar subsegmental atelectasis or edema is noted with possible small pleural effusions. Electronically Signed   By: Lupita Raider, M.D.   On: 08/27/2017 07:20   Dg Chest Port 1 View  Result Date: 08/25/2017 CLINICAL DATA:  Acute onset of shortness of breath. EXAM: PORTABLE CHEST 1 VIEW COMPARISON:  Chest radiograph performed 08/21/2017, and CTA of the chest performed earlier today at 6:34 a.m. FINDINGS: The lungs are well-aerated. Vascular congestion is noted. Increased interstitial markings may reflect mild interstitial edema. Small bilateral pleural effusions are suspected. There is no evidence of  pneumothorax. The cardiomediastinal silhouette is mildly enlarged. No acute osseous abnormalities are seen. IMPRESSION: Vascular congestion and mild cardiomegaly. Increased interstitial markings may reflect mild interstitial edema. Small bilateral pleural effusions suspected. Electronically Signed   By: Roanna Raider M.D.   On: 08/25/2017 18:38     Medications:   . aztreonam 500 mg (08/27/17 0954)  . heparin 1,650 Units/hr (08/27/17 0700)  . nitroGLYCERIN 20 mcg/min (08/27/17 0945)   . amLODipine  5 mg Oral Daily  . aspirin  81 mg Oral  Daily  . atorvastatin  40 mg Oral q1800  . budesonide (PULMICORT) nebulizer solution  0.5 mg Nebulization BID  . clopidogrel  75 mg Oral Q1200  . docusate sodium  100 mg Oral BID  . epoetin (EPOGEN/PROCRIT) injection  4,000 Units Intravenous Q M,W,F-HD  . escitalopram  2.5 mg Oral Daily  . fluticasone  1 spray Each Nare Daily  . gabapentin  300 mg Oral Daily  . hydrALAZINE  50 mg Oral 3 times per day on Sun Tue Thu Sat   And  . hydrALAZINE  50 mg Oral 2 times per day on Mon Wed Fri  . insulin aspart  0-9 Units Subcutaneous Q4H  . ipratropium-albuterol  3 mL Nebulization Q6H  . isosorbide mononitrate  90 mg Oral Daily  . lactulose  10 g Oral q1800  . levETIRAcetam  1,500 mg Oral q1800  . lidocaine  1 patch Transdermal QHS  . linezolid  600 mg Oral Q12H  . mouth rinse  15 mL Mouth Rinse BID  . Melatonin  5 mg Oral QHS  . metoprolol tartrate  50 mg Oral BID  . multivitamin with minerals  1 tablet Oral Daily  . OXcarbazepine  150 mg Oral QHS  . pantoprazole  40 mg Oral Daily  . polyethylene glycol  17 g Oral Q1200  . polyvinyl alcohol  1 drop Both Eyes TID  . sevelamer carbonate  800 mg Oral TID AC & HS   acetaminophen **OR** acetaminophen, benztropine, bisacodyl, bisacodyl, clonazePAM **AND** [DISCONTINUED] clonazePAM, hydrALAZINE, HYDROcodone-acetaminophen, lip balm, nitroGLYCERIN, ondansetron **OR** ondansetron (ZOFRAN) IV, sodium phosphate,  traZODone  Assessment/ Plan:  52 y.o. caucaisan female  female with end stage renal disease on hemodialysis, COPD/asthma, diabetes mellitus type II, congestive heart failure, coronary artery disease. 5/24- admitted for altered mental status   [facility] [address]/ MWF 107kg Right AVF  1. End Stage Renal Disease with hyperkalemia -Patient due for her usual dialysis today.  Ultrafiltration target is 2.5 kg.  Hopefully this will allow her to lay flat for cardiac catheterization tomorrow.  2. AOCKD -Continue current dosage of Epogen.  3. SHPTH -We plan to check serum phosphorus today and continue the patient on Renvela.  4. Altered mental status and back pain -Brain MRI was negative for acute CVA.  Thoracic MRI also normal.  5.  NSTEMI.  Cardiac catheterization has been scheduled for tomorrow.  Cardiology has requested that we remove fluids that she is able to lay flat.    LOS: 5 Carnelius Hammitt 5/29/201910:41 AM  [facility] [place], [place] [phone]  Note: This note was prepared with Dragon dictation. Any transcription errors are unintentional

## 2017-08-27 NOTE — Progress Notes (Signed)
Pre HD assessment    08/27/17 2115  Neurological  Level of Consciousness Alert  Orientation Level Oriented X4  Respiratory  Respiratory Pattern Regular;Unlabored  Chest Assessment Chest expansion symmetrical  Cardiac  ECG Monitor Yes  Vascular  R Radial Pulse +2  L Radial Pulse +2  Edema Generalized  Integumentary  Integumentary (WDL) X  Skin Color Appropriate for ethnicity  Musculoskeletal  Musculoskeletal (WDL) X  Generalized Weakness Yes  Assistive Device None  GU Assessment  Genitourinary (WDL) X  Genitourinary Symptoms  (HD)  Psychosocial  Psychosocial (WDL) WDL

## 2017-08-27 NOTE — Progress Notes (Signed)
PT Cancellation Note  Patient Details Name: Kimberly Montgomery MRN: [id number] DOB: Aug 13, 1965   Cancelled Treatment:    Reason Eval/Treat Not Completed: Medical issues which prohibited therapy; Since initial PT evaluation pt was transferred to the floor but then back to the ICU with new diagnosis of NSTEMI.  Will complete current PT orders but will reassess pt upon receipt of new PT orders.   Ovidio Hanger PT, DPT 08/27/17, 9:17 AM

## 2017-08-27 NOTE — Progress Notes (Signed)
Name: Kimberly Montgomery MRN: [id number] DOB: 22-Aug-1965     CONSULTATION DATE: 08/21/2017  Subjective the and sign objectives: Remains on nitroglycerin and heparin drip, and a chest pain last night no EKG changes compared to previous and troponin continues to trend down.  PAST MEDICAL HISTORY :   has a past medical history of Anginal pain (HCC), Anxiety, Bipolar disorder (HCC), CAD (coronary artery disease), CHF (congestive heart failure) (HCC), Chronic lower back pain, Depression, Endometriosis, ESRD (end stage renal disease) on dialysis (HCC), Gastroparesis, GERD (gastroesophageal reflux disease), History of blood transfusion ("several"), History of hiatal hernia, HLD (hyperlipidemia), Hypertension, Migraine, Myocardial infarction (HCC) (2017), Renal disorder, Renal insufficiency, Seizures (HCC) (07/2015), Stroke (HCC), and Type II diabetes mellitus (HCC).  has a past surgical history that includes Hysterotomy; Below knee leg amputation (Right, 2010?); Cardiac catheterization (N/A, 08/30/2015); Cardiac catheterization (N/A, 01/01/2016); Abdominal hysterectomy; Tubal ligation; Peritoneal catheter removal (01/01/2016); Salpingoophorectomy (Right); Cardiac catheterization (Right, 07/10/2015); Cardiac catheterization (Right, 09/14/2015); Cardiac catheterization (N/A, 09/14/2015); Cardiac catheterization (Right, 11/20/2015); Coronary angioplasty with stent (<2017); Peritoneal catheter insertion (11/03/2013); Peritoneal catheter removal (11/03/2013; 02/11/2014); TEE without cardioversion (N/A, 06/07/2016); Cataract extraction, bilateral; Coronary angioplasty with stent; AV fistula placement (Right, 08/23/2016); and DIALYSIS/PERMA CATHETER REMOVAL (N/A, 11/12/2016). Prior to Admission medications   Medication Sig Start Date End Date Taking? Authorizing Provider  acetaminophen (TYLENOL) 500 MG tablet Take 500 mg by mouth every 6 (six) hours as needed for moderate pain or fever.   Yes [provider]    albuterol (PROVENTIL) (2.5 MG/3ML) 0.083% nebulizer solution Take 2.5 mg by nebulization every 4 (four) hours as needed for wheezing or shortness of breath.   Yes [provider]  amLODipine (NORVASC) 10 MG tablet Take 10 mg by mouth daily.    Yes [provider]  aspirin 81 MG chewable tablet Chew 81 mg by mouth daily.   Yes [provider]  atorvastatin (LIPITOR) 40 MG tablet Take 40 mg by mouth daily at 6 PM.   Yes [provider]  benztropine (COGENTIN) 0.5 MG tablet Take 0.5 mg by mouth 2 (two) times daily.   Yes [provider]  bisacodyl (DULCOLAX) 10 MG suppository Place 10 mg rectally as needed for moderate constipation.   Yes [provider]  carboxymethylcellulose (REFRESH) 1 % ophthalmic solution Place 1 drop into both eyes 3 (three) times daily.   Yes [provider]  clonazePAM (KLONOPIN) 0.5 MG tablet Take  tablet (0.25MG ) by mouth every morning and 1 tablet (0.5MG ) by mouth every evening   Yes [provider]  clopidogrel (PLAVIX) 75 MG tablet Take 75 mg by mouth daily at 12 noon.    Yes [provider]  escitalopram (LEXAPRO) 5 MG tablet Take 2.5 mg by mouth daily.   Yes [provider]  fluticasone (FLONASE) 50 MCG/ACT nasal spray Place 1 spray into both nostrils daily.    Yes [provider]  gabapentin (NEURONTIN) 300 MG capsule Take 300 mg by mouth daily.    Yes [provider]  hydrALAZINE (APRESOLINE) 50 MG tablet Take 1 tablet (50MG ) by mouth three times daily (Sunday, Tuesday, Thursday & Saturday) and take 1 tablet (50MG ) by mouth twice daily (Monday, Wednesday & Friday).   Yes [provider]  HYDROcodone-acetaminophen (NORCO/VICODIN) 5-325 MG tablet Take 1 tablet by mouth every 6 (six) hours as needed for moderate pain or severe pain. 05/20/17  Yes Delfino Lovett, MD  insulin aspart (NOVOLOG) 100 UNIT/ML injection Inject 7 Units into the skin  3 (three) times  daily with meals.    Yes [provider]  insulin detemir (LEVEMIR) 100 UNIT/ML injection Inject 4 Units into the skin daily.    Yes [provider]  isosorbide mononitrate (IMDUR) 30 MG 24 hr tablet Take 90 mg by mouth daily at 12 noon.    Yes [provider]  lactulose (CHRONULAC) 10 GM/15ML solution Take 10 g by mouth daily at 6 PM.    Yes [provider]  levETIRAcetam (KEPPRA) 750 MG tablet Take 2 tablets (1,500 mg total) by mouth daily at 12 noon. 05/20/17  Yes Delfino Lovett, MD  lidocaine (LIDODERM) 5 % Place 1 patch onto the skin at bedtime.    Yes [provider]  lidocaine (LMX) 4 % cream Apply 1 application topically daily as needed (pain). To right arm fistula   Yes [provider]  linagliptin (TRADJENTA) 5 MG TABS tablet Take 5 mg by mouth daily.   Yes [provider]  lisinopril (PRINIVIL,ZESTRIL) 10 MG tablet Take 10 mg by mouth daily at 12 noon.   Yes [provider]  Melatonin 3 MG TABS Take 3 mg by mouth at bedtime.   Yes [provider]  metoprolol (LOPRESSOR) 50 MG tablet Take 1 tablet (50 mg total) by mouth 2 (two) times daily. 09/15/15  Yes Sudini, Wardell Heath, MD  multivitamin (ONE-A-DAY MEN'S) TABS tablet Take 1 tablet by mouth daily.   Yes [provider]  nitroGLYCERIN (NITROSTAT) 0.4 MG SL tablet Place 0.4 mg under the tongue every 5 (five) minutes as needed for chest pain. Reported on 07/10/2015   Yes [provider]  omeprazole (PRILOSEC) 20 MG capsule Take 20 mg by mouth 2 (two) times daily.    Yes [provider]  ondansetron (ZOFRAN ODT) 4 MG disintegrating tablet Take 1 tablet (4 mg total) by mouth every 6 (six) hours as needed for nausea or vomiting. 07/16/15  Yes Sharyn Creamer, MD  OXcarbazepine (TRILEPTAL) 150 MG tablet Take 150 mg by mouth at bedtime.   Yes [provider]  polyethylene glycol (MIRALAX / GLYCOLAX) packet Take 17 g by mouth daily. Patient  taking differently: Take 17 g by mouth daily at 12 noon.  08/02/15  Yes Adrian Saran, MD  senna (SENOKOT) 8.6 MG TABS tablet Take 2 tablets by mouth 2 (two) times daily.    Yes [provider]  sevelamer carbonate (RENVELA) 800 MG tablet Take 800 mg by mouth 4 (four) times daily -  before meals and at bedtime.    Yes [provider]  torsemide (DEMADEX) 100 MG tablet Take 100 mg by mouth daily.    Yes [provider]   Allergies  Allergen Reactions  . Cephalosporins Anaphylaxis    Patient has tolerated meropenem.   [name] Penicillins Anaphylaxis and Other (See Comments)    Has patient had a PCN reaction causing immediate rash, facial/tongue/throat swelling, SOB or lightheadedness with hypotension: Yes Has patient had a PCN reaction causing severe rash involving mucus membranes or skin necrosis: No Has patient had a PCN reaction that required hospitalization No Has patient had a PCN reaction occurring within the last 10 years: No If all of the above answers are "NO", then may proceed with Cephalosporin use.  . Reglan [Metoclopramide] Other (See Comments)    Severe EPS (lip, head, body tremors) March 2018  . Risperdal [Risperidone] Other (See Comments)    Severe EPS (lip, head, body tremors) March 2018  . Lamictal [Lamotrigine] Other (See  Comments)    Reaction:  Hallucinations  . Phenergan [Promethazine Hcl] Nausea And Vomiting    Sensitivity to medicine  . Pravastatin Other (See Comments)    Reaction:  Muscle pain   . Sulfa Antibiotics Other (See Comments)    Reaction:  Unknown     FAMILY HISTORY:  family history includes Bipolar disorder in her unknown relative; CAD in her unknown relative; Cervical cancer in her mother; Diabetes in her unknown relative; Other in her father. SOCIAL HISTORY:  reports that she quit smoking about 2 years ago. Her smoking use included cigarettes. She smoked 0.75 packs per day. She has never used smokeless tobacco. She reports that she  does not drink alcohol or use drugs.  REVIEW OF SYSTEMS:   Unable to obtain due to critical illness   VITAL SIGNS: Temp:  [97.9 F (36.6 C)-98.4 F (36.9 C)] 98.3 F (36.8 C) (05/29 0800) Pulse Rate:  [57-92] 63 (05/29 0800) Resp:  [16-28] 19 (05/29 0800) BP: (128-172)/(56-126) 156/60 (05/29 0800) SpO2:  [9 %-99 %] 96 % (05/29 0800) FiO2 (%):  [30 %] 30 % (05/29 0129) Weight:  [112.9 kg (248 lb 14.4 oz)] 112.9 kg (248 lb 14.4 oz) (05/29 0500)  Physical Examination:  Awake and oriented with no focal neurological deficits On [place], no distress, able to talk in full sentences, bilateral equal air entry and no adventitious sounds S1 & S2 are audible with no murmur Benign obese abdomen was normal peristalsis Wasted extremities no edema on the right below-knee amputation  ASSESSMENT / PLAN: Acute respiratory failure tolerating [place].   -Monitor ABG, work of breathing, or facet and optimize O2 therapy.  NSTEMI.cardiac enzymes are trending down.  Echo in 08/26/2017 consistent with LVEF 55 to 60%, no regional wall motion abnormalities, and grade 2 diastolic dysfunction. TEE 05/2017, LVEF 55-60% and no wall motion abnormality.History of CAD status post PCI and 2 stents 10 years ago as per patient -Heparin gtt + ASA + Plavix + Statin + BB. -Plan for coronary angiogram in a.m. as per cardiology pending the patient being able to lay down flat with no respiratory distress.  Hypertensive emergency with acute coronary syndrome -Optimize antihypertensives and monitor hemodynamics  Atelectasis and possible pneumonia with infective etiology can not be ruled out.  Bibasilar airspace disease -Started on Zyvox + Azactam. Monitor CXR + CBC + FIO2.  UTI enterococcal infection -Zyvox  Non specific incidental mediastinal adenopathy.  -Follow up CT in 3 weeks and consider pulmonary consult.  End-stage renal disease on HD as per renal  Enterococcus urinary tract infection on Zyvox with history of  penicillin allergy  Seizure disorder on Keppra  GERD on Protonix  Dyslipidemia on atorvastatin  Anemia -Keep HB . 8 gm/dl.  Full code  DVT &GI prophylaxis. Continue with supportive care  Critical care time 35 minutes

## 2017-08-27 NOTE — Progress Notes (Signed)
SUBJECTIVE: Feeling well, no chest pain or shortness of breath.    Vitals:   08/27/17 0500 08/27/17 0600 08/27/17 0700 08/27/17 0800  BP: (!) 140/59 (!) 128/59 (!) 128/58 (!) 156/60  Pulse: (!) 58 (!) 58 (!) 57 63  Resp:  19 17 19   Temp: 97.9 F (36.6 C)   98.3 F (36.8 C)  TempSrc: Oral   Oral  SpO2: 99% 96% 98% 96%  Weight: 248 lb 14.4 oz (112.9 kg)     Height:        Intake/Output Summary (Last 24 hours) at 08/27/2017 0918 Last data filed at 08/27/2017 0800 Gross per 24 hour  Intake 915.87 ml  Output 200 ml  Net 715.87 ml    LABS: Basic Metabolic Panel: No results for input(s): NA, K, CL, CO2, GLUCOSE, BUN, CREATININE, CALCIUM, MG, PHOS in the last 72 hours. Liver Function Tests: No results for input(s): AST, ALT, ALKPHOS, BILITOT, PROT, ALBUMIN in the last 72 hours. No results for input(s): LIPASE, AMYLASE in the last 72 hours. CBC: Recent Labs    08/26/17 0308 08/27/17 0539  WBC 6.6 6.6  NEUTROABS  --  4.5  HGB 10.0* 9.0*  HCT 30.5* 28.0*  MCV 88.2 88.1  PLT 217 238   Cardiac Enzymes: Recent Labs    08/25/17 1815 08/25/17 2236 08/26/17 1840  TROPONINI 2.84* 2.45* 1.86*   BNP: Invalid input(s): POCBNP D-Dimer: No results for input(s): DDIMER in the last 72 hours. Hemoglobin A1C: No results for input(s): HGBA1C in the last 72 hours. Fasting Lipid Panel: No results for input(s): CHOL, HDL, LDLCALC, TRIG, CHOLHDL, LDLDIRECT in the last 72 hours. Thyroid Function Tests: No results for input(s): TSH, T4TOTAL, T3FREE, THYROIDAB in the last 72 hours.  Invalid input(s): FREET3 Anemia Panel: No results for input(s): VITAMINB12, FOLATE, FERRITIN, TIBC, IRON, RETICCTPCT in the last 72 hours.   PHYSICAL EXAM General: Well developed, well nourished, in no acute distress HEENT:  Normocephalic and atramatic Neck:  No JVD.  Lungs: Clear bilaterally to auscultation and percussion. Heart: HRRR . Normal S1 and S2 without gallops or murmurs.  Abdomen: Bowel  sounds are positive, abdomen soft and non-tender  Msk:  Back normal, normal gait. Normal strength and tone for age. Extremities: No clubbing, cyanosis or edema.  Below knee right leg amputation. Neuro: Alert and oriented x3 Psych:  Good affect, responds appropriately  TELEMETRY:NSR 60bpm  ASSESSMENT AND PLAN: History of coronary artery disease with elevated troponin to 3.02, now trending down.Echo showed pseudonormal LVEF, grade 2 diastolic dysfunction, and mild mitral and aortic valve stenosis.    Advise cardiac cath tomorrow, dialysis pending today. Pleae place pt NPO from 0001 08/28/17. May wean off nitro drip as tolerated for chest pain.   Active Problems:   Acute encephalopathy    Caroleen Hamman, NP-C 08/27/2017 9:18 AM Cell: [phone]

## 2017-08-27 NOTE — Progress Notes (Signed)
HD tx start    08/27/17 2228  Vital Signs  Pulse Rate 84  Pulse Rate Source Monitor  Resp (!) 23  BP (!) 178/67  BP Location Left Arm  BP Method Automatic  Patient Position (if appropriate) Lying  Oxygen Therapy  SpO2 97 %  O2 Device Nasal Cannula  O2 Flow Rate (L/min) 1 L/min  During Hemodialysis Assessment  Blood Flow Rate (mL/min) 400 mL/min  Arterial Pressure (mmHg) -120 mmHg  Venous Pressure (mmHg) 200 mmHg  Transmembrane Pressure (mmHg) 70 mmHg  Ultrafiltration Rate (mL/min) 860 mL/min  Dialysate Flow Rate (mL/min) 800 ml/min  Conductivity: Machine  14.1  HD Safety Checks Performed Yes  Dialysis Fluid Bolus Normal Saline  Bolus Amount (mL) 250 mL  Intra-Hemodialysis Comments Tx initiated  Fistula / Graft Right Upper arm Arteriovenous fistula  Placement Date/Time: 08/23/16 0915   Orientation: Right  Access Location: Upper arm  Access Type: Arteriovenous fistula  Status Accessed  Needle Size 15

## 2017-08-27 NOTE — Evaluation (Signed)
Physical Therapy Re-Evaluation Patient Details Name: Kimberly Montgomery MRN: [id number] DOB: November 24, 1965 Today's Date: 08/27/2017   History of Present Illness  Pt is a51 y.o.female, NH resident,with a known history of end-stage renal disease on hemodialysis,CAD, CHF,diabetes,bipolar disorder and other comorbidities. Patient was transferred from [facility] due to acute change in mental status and lethargy.Per nursing home staff report, patient was noted to be drowsy all day, gradually getting worse.Blood test done at the facility showed hyperkalemia with potassium level is 6.2.Her oxygen saturation was noted to be low in 80s, on room air. Blood test done in the emergency room were remarkable for sodium level of 131, potassium elevated at 6.2 and creatinine elevated at 6.39. Brain CT was negative for any acute infarct or bleed.The chest x-ray showed cardiomegaly without any infiltrates. Pt was admitted for further evaluation and treatment. Assessment included acute metabolic encephalopathy, hyperkalemia, DM II, h/o seizures on keppra, HLD, and GERD.  Pt transfered to the floor but then transferred back to ICU with new diagnosis of NSTEMI.  New orders received for continued PT services.  Assessment now includes: acute NSTEMI, acute hypoxic respiratory failure, acute on chronic diastolic CHF exacerbation, chronic end stage renal disease, acute hypertensive urgency, acute UTI, and acute nonspecific mediastinal lymphadenopathy.     Clinical Impression  Pt presents with deficits in strength, transfers, mobility, gait, balance, and activity tolerance.  Pt with significant improvement in level of alertness and ability to converse with pt pleasant and motivated to participate throughout the session.  Pt required min to mod A with bed mobility tasks with improved static sitting balance once up to the EOB.  Pt required two attempts to come to standing at EOB with +2 min A and was steady once in  standing with BUE support on the RW.  Pt did not feel safe to attempt SPT this session.  Pt was able to perform 3-4 very small amplitude mini squats on her LLE while in standing.  During stand to sit pt presented with poor eccentric control.  Pt's SpO2 on 1LO2/min remained in the 90s throughout most of the session with the exception of one brief drop to 87-88% after standing and returning to supine.  SpO2 quickly returned to the mid 90s once pt was at rest, nursing aware.  Pt will benefit from PT services in a SNF setting upon discharge to safely address above deficits for decreased caregiver assistance and eventual return to PLOF.      Follow Up Recommendations SNF;Supervision for mobility/OOB    Equipment Recommendations  None recommended by PT    Recommendations for Other Services       Precautions / Restrictions Precautions Precautions: Fall Restrictions Weight Bearing Restrictions: No      Mobility  Bed Mobility Overal bed mobility: Needs Assistance Bed Mobility: Supine to Sit;Sit to Supine;Rolling Rolling: Min assist   Supine to sit: Min assist;Mod assist Sit to supine: Min assist;Mod assist   General bed mobility comments: Verbal cues for sequencing with assist required for full upright sitting during sup to sit and for proper final positioning during sit to sup  Transfers Overall transfer level: Needs assistance Equipment used: Rolling walker (2 wheeled) Transfers: Sit to/from Stand Sit to Stand: +2 safety/equipment;Min assist;+2 physical assistance         General transfer comment: Min verbal cues for sequencing with pt able to stand on the second attempt with +2 min A  Ambulation/Gait  General Gait Details: Unable  Information systems manager [place] (Stroke Patients Only)       Balance Overall balance assessment: Needs assistance Sitting-balance support: Feet unsupported;Bilateral upper extremity  supported Sitting balance-Leahy Scale: Good Sitting balance - Comments: Improved sitting balance this session   Standing balance support: Bilateral upper extremity supported Standing balance-Leahy Scale: Fair                               Pertinent Vitals/Pain Pain Assessment: 0-10 Pain Score: 6  Pain Location: back pain Pain Descriptors / Indicators: Sore;Aching Pain Intervention(s): Premedicated before session;Monitored during session    Home Living Family/patient expects to be discharged to:: Assisted living               Home Equipment: Walker - 2 wheels      Prior Function Level of Independence: Needs assistance   Gait / Transfers Assistance Needed: Pt able to amb 50-100' with a RW and SBA with her prosthetic limb donned, Ind with transfers bed to/from chair, Ind with bed mobility, no recent falls  ADL's / Homemaking Assistance Needed: Pt required set-up for ADLs but then was Ind with bathing and dressing        Hand Dominance   Dominant Hand: Right    Extremity/Trunk Assessment   Upper Extremity Assessment Upper Extremity Assessment: Generalized weakness    Lower Extremity Assessment Lower Extremity Assessment: Generalized weakness       Communication   Communication: No difficulties  Cognition Arousal/Alertness: Awake/alert Behavior During Therapy: WFL for tasks assessed/performed Overall Cognitive Status: Within Functional Limits for tasks assessed                                        General Comments      Exercises Total Joint Exercises Ankle Circles/Pumps: AROM;Left;10 reps Quad Sets: Strengthening;Both;10 reps Gluteal Sets: Strengthening;Both;10 reps Heel Slides: Left;5 reps Hip ABduction/ADduction: AROM;Both;10 reps Straight Leg Raises: AROM;Both;10 reps Long Arc Quad: AROM;Both;10 reps Knee Flexion: AROM;Both;10 reps Other Exercises Other Exercises: HEP education and review for LLE APs and BLE QS and  GS x 10 each 5-6x/day and BLE hip abd/add and SLR x 10 each 1-2x/day   Assessment/Plan    PT Assessment Patient needs continued PT services  PT Problem List Decreased strength;Decreased activity tolerance;Decreased balance;Decreased mobility       PT Treatment Interventions DME instruction;Gait training;Functional mobility training;Balance training;Therapeutic exercise;Therapeutic activities;Patient/family education    PT Goals (Current goals can be found in the Care Plan section)  Acute Rehab PT Goals Patient Stated Goal: To get stronger and back to walking PT Goal Formulation: With patient Time For Goal Achievement: 09/09/17 Potential to Achieve Goals: Good    Frequency Min 2X/week   Barriers to discharge        Co-evaluation               AM-PAC PT "6 Clicks" Daily Activity  Outcome Measure Difficulty turning over in bed (including adjusting bedclothes, sheets and blankets)?: Unable Difficulty moving from lying on back to sitting on the side of the bed? : Unable Difficulty sitting down on and standing up from a chair with arms (e.g., wheelchair, bedside commode, etc,.)?: Unable Help needed moving to and from a bed to chair (including a  wheelchair)?: A Lot Help needed walking in hospital room?: Total Help needed climbing 3-5 steps with a railing? : Total 6 Click Score: 7    End of Session Equipment Utilized During Treatment: Gait belt;Oxygen Activity Tolerance: Patient tolerated treatment well Patient left: in bed;with call bell/phone within reach;with bed alarm set Nurse Communication: Mobility status PT Visit Diagnosis: Difficulty in walking, not elsewhere classified (R26.2);Muscle weakness (generalized) (M62.81)    Time: 1445-1510 PT Time Calculation (min) (ACUTE ONLY): 25 min   Charges:   PT Evaluation $PT Re-evaluation: 1 Re-eval PT Treatments $Therapeutic Exercise: 8-22 mins   PT G Codes:        DElly Modena PT, DPT 08/27/17, 3:49 PM

## 2017-08-27 NOTE — Progress Notes (Signed)
ANTICOAGULATION CONSULT NOTE - Initial Consult  Pharmacy Consult for heparin infusion Indication: chest pain/ACS  Allergies  Allergen Reactions  . Cephalosporins Anaphylaxis    Patient has tolerated meropenem.   [name] Penicillins Anaphylaxis and Other (See Comments)    Has patient had a PCN reaction causing immediate rash, facial/tongue/throat swelling, SOB or lightheadedness with hypotension: Yes Has patient had a PCN reaction causing severe rash involving mucus membranes or skin necrosis: No Has patient had a PCN reaction that required hospitalization No Has patient had a PCN reaction occurring within the last 10 years: No If all of the above answers are "NO", then may proceed with Cephalosporin use.  . Reglan [Metoclopramide] Other (See Comments)    Severe EPS (lip, head, body tremors) March 2018  . Risperdal [Risperidone] Other (See Comments)    Severe EPS (lip, head, body tremors) March 2018  . Lamictal [Lamotrigine] Other (See Comments)    Reaction:  Hallucinations  . Phenergan [Promethazine Hcl] Nausea And Vomiting    Sensitivity to medicine  . Pravastatin Other (See Comments)    Reaction:  Muscle pain   . Sulfa Antibiotics Other (See Comments)    Reaction:  Unknown     Patient Measurements: Height: 5\' 9"  (175.3 cm) Weight: 248 lb 14.4 oz (112.9 kg) IBW/kg (Calculated) : 66.2 Heparin Dosing Weight: 91.1 kg  Vital Signs: Temp: 98.4 F (36.9 C) (05/29 1226) Temp Source: Axillary (05/29 1226) BP: 157/55 (05/29 1400) Pulse Rate: 70 (05/29 1400)  Labs: Recent Labs    08/25/17 1815 08/25/17 2236 08/26/17 0308  08/26/17 1840 08/26/17 2003 08/27/17 0539 08/27/17 1356  HGB 11.0*  --  10.0*  --   --   --  9.0*  --   HCT 33.7*  --  30.5*  --   --   --  28.0*  --   PLT 239  --  217  --   --   --  238  --   APTT 36  --   --   --   --   --   --   --   LABPROT 13.1  --   --   --   --   --   --   --   INR 1.00  --   --   --   --   --   --   --   HEPARINUNFRC  --   --   0.27*   < >  --  0.22* 0.45 0.34  TROPONINI 2.84* 2.45*  --   --  1.86*  --   --   --    < > = values in this interval not displayed.    Estimated Creatinine Clearance: 19.6 mL/min (A) (by C-G formula based on SCr of 4.46 mg/dL (H)).   Medical History: Past Medical History:  Diagnosis Date  . Anginal pain (HCC)   . Anxiety   . Bipolar disorder (HCC)   . CAD (coronary artery disease)   . CHF (congestive heart failure) (HCC)   . Chronic lower back pain   . Depression   . Endometriosis   . ESRD (end stage renal disease) on dialysis (HCC)    "[facility]; [address]; [place]; TTS" (01/19/2016)  . Gastroparesis   . GERD (gastroesophageal reflux disease)   . History of blood transfusion "several"   "my blood would get low; low RBC"  . History of hiatal hernia   . HLD (hyperlipidemia)   . Hypertension   . Migraine    "  monthly" (01/19/2016)  . Myocardial infarction (HCC) 2017   "~ 3 wks ago" (01/19/2016)  . Renal disorder   . Renal insufficiency   . Seizures (HCC) 07/2015   "I've only had the 1; don't know what from" (01/19/2016)  . Stroke (HCC)   . Type II diabetes mellitus (HCC)     Medications:  Infusions:  . aztreonam Stopped (08/27/17 1059)  . heparin 1,650 Units/hr (08/27/17 0700)  . nitroGLYCERIN Stopped (08/27/17 1115)    Assessment: 71 yof with extensive PMH including CAD s/p PCI with 2 stents 10 years ago, CHF, ESRD on HD, PVD, right BKA, HTN, DM, and seizure disorder. Had CP at end of HD session today, initial troponin 3.02. Pharmacy consulted to dose heparin for ACS.   Goal of Therapy:  Heparin level 0.3-0.7 units/ml Monitor platelets by anticoagulation protocol: Yes   Plan:  Anti-Xa level 0.34 therapeutic at 1356. Will continue heparin drip at 1650 units/hr. Therapeutic level x2. Will check anti-Xa level and CBC in AM.    Mirna Mires,  Pharmacy Student 08/27/2017,2:47 PM

## 2017-08-27 NOTE — Progress Notes (Signed)
Pre HD assessment   08/27/17 2214  Vital Signs  Temp 98.5 F (36.9 C)  Temp Source Oral  Pulse Rate 82  Pulse Rate Source Monitor  Resp 15  BP (!) 163/62  BP Location Left Arm  BP Method Automatic  Patient Position (if appropriate) Lying  Oxygen Therapy  SpO2 97 %  O2 Device Nasal Cannula  O2 Flow Rate (L/min) 1 L/min  Pain Assessment  Pain Scale 0-10  Pain Score 5  Pain Location Back  Pain Orientation Right;Left;Lower  Pain Descriptors / Indicators Aching  Pain Frequency Constant  Pain Onset On-going  Patients Stated Pain Goal 0  Pain Intervention(s) RN made aware  Dialysis Weight  Weight 119.6 kg (263 lb 10.7 oz)  Type of Weight Pre-Dialysis  Time-Out for Hemodialysis  What Procedure? HD  Pt Identifiers(min of two) First/Last Name;MRN/Account#  Correct Site? Yes  Correct Side? Yes  Correct Procedure? Yes  Consents Verified? Yes  Rad Studies Available? N/A  Safety Precautions Reviewed? Yes  Biochemist, clinical Number  (7A)  Station Number  (bedside, ICU 11)  UF/Alarm Test Passed  Conductivity: Meter 14.2  Conductivity: Machine  14.1  pH 7.6  Reverse Osmosis [id number]  Normal Saline Lot Number [id number]  Dialyzer Lot Number [id number]  Disposable Set Lot Number [id number]  Machine Temperature 98.6 F (37 C)  Immunologist and Audible Yes  Blood Lines Intact and Secured Yes  Pre Treatment Patient Checks  Vascular access used during treatment Fistula  Hepatitis B Surface Antigen Results Negative  Date Hepatitis B Surface Antigen Drawn 06/01/16  Hepatitis B Surface Antibody  (>10)  Date Hepatitis B Surface Antibody Drawn 01/06/17  Hemodialysis Consent Verified Yes  Hemodialysis Standing Orders Initiated Yes  ECG (Telemetry) Monitor On Yes  Prime Ordered Normal Saline  Length of  DialysisTreatment -hour(s) 3.5 Hour(s)  Dialyzer Elisio 17H NR  Dialysate 2K, 2.5 Ca  Dialysis Anticoagulant None  Dialysate Flow Ordered 800  Blood Flow Rate Ordered 400  mL/min  Pre Treatment Labs Phosphorus  Dialysis Blood Pressure Support Ordered Normal Saline  Education / Care Plan  Dialysis Education Provided Yes  Documented Education in Care Plan Yes  Fistula / Graft Right Upper arm Arteriovenous fistula  Placement Date/Time: 08/23/16 0915   Orientation: Right  Access Location: Upper arm  Access Type: Arteriovenous fistula  Site Condition No complications  Fistula / Graft Assessment Present;Thrill;Bruit  Drainage Description None

## 2017-08-27 NOTE — Progress Notes (Signed)
ANTICOAGULATION CONSULT NOTE - Initial Consult  Pharmacy Consult for heparin infusion Indication: chest pain/ACS  Allergies  Allergen Reactions  . Cephalosporins Anaphylaxis    Patient has tolerated meropenem.   [name] Penicillins Anaphylaxis and Other (See Comments)    Has patient had a PCN reaction causing immediate rash, facial/tongue/throat swelling, SOB or lightheadedness with hypotension: Yes Has patient had a PCN reaction causing severe rash involving mucus membranes or skin necrosis: No Has patient had a PCN reaction that required hospitalization No Has patient had a PCN reaction occurring within the last 10 years: No If all of the above answers are "NO", then may proceed with Cephalosporin use.  . Reglan [Metoclopramide] Other (See Comments)    Severe EPS (lip, head, body tremors) March 2018  . Risperdal [Risperidone] Other (See Comments)    Severe EPS (lip, head, body tremors) March 2018  . Lamictal [Lamotrigine] Other (See Comments)    Reaction:  Hallucinations  . Phenergan [Promethazine Hcl] Nausea And Vomiting    Sensitivity to medicine  . Pravastatin Other (See Comments)    Reaction:  Muscle pain   . Sulfa Antibiotics Other (See Comments)    Reaction:  Unknown     Patient Measurements: Height: 5\' 9"  (175.3 cm) Weight: 248 lb 14.4 oz (112.9 kg) IBW/kg (Calculated) : 66.2 Heparin Dosing Weight: 91.1 kg  Vital Signs: Temp: 97.9 F (36.6 C) (05/29 0500) Temp Source: Oral (05/29 0500) BP: 128/59 (05/29 0600) Pulse Rate: 58 (05/29 0600)  Labs: Recent Labs    08/25/17 1815 08/25/17 2236  08/26/17 0308 08/26/17 1133 08/26/17 1840 08/26/17 2003 08/27/17 0539  HGB 11.0*  --   --  10.0*  --   --   --  9.0*  HCT 33.7*  --   --  30.5*  --   --   --  28.0*  PLT 239  --   --  217  --   --   --  238  APTT 36  --   --   --   --   --   --   --   LABPROT 13.1  --   --   --   --   --   --   --   INR 1.00  --   --   --   --   --   --   --   HEPARINUNFRC  --   --    < >  0.27* 0.33  --  0.22* 0.45  TROPONINI 2.84* 2.45*  --   --   --  1.86*  --   --    < > = values in this interval not displayed.    Estimated Creatinine Clearance: 19.6 mL/min (A) (by C-G formula based on SCr of 4.46 mg/dL (H)).   Medical History: Past Medical History:  Diagnosis Date  . Anginal pain (HCC)   . Anxiety   . Bipolar disorder (HCC)   . CAD (coronary artery disease)   . CHF (congestive heart failure) (HCC)   . Chronic lower back pain   . Depression   . Endometriosis   . ESRD (end stage renal disease) on dialysis (HCC)    "[facility]; [address]; [place]; TTS" (01/19/2016)  . Gastroparesis   . GERD (gastroesophageal reflux disease)   . History of blood transfusion "several"   "my blood would get low; low RBC"  . History of hiatal hernia   . HLD (hyperlipidemia)   . Hypertension   . Migraine    "  monthly" (01/19/2016)  . Myocardial infarction (HCC) 2017   "~ 3 wks ago" (01/19/2016)  . Renal disorder   . Renal insufficiency   . Seizures (HCC) 07/2015   "I've only had the 1; don't know what from" (01/19/2016)  . Stroke (HCC)   . Type II diabetes mellitus (HCC)     Medications:  Infusions:  . aztreonam Stopped (08/27/17 0230)  . heparin 1,650 Units/hr (08/27/17 0600)  . nitroGLYCERIN 25 mcg/min (08/27/17 0600)    Assessment: 79 yof with extensive PMH including CAD s/p PCI with 2 stents 10 years ago, CHF, ESRD on HD, PVD, right BKA, HTN, DM, and seizure disorder. Had CP at end of HD session today, initial troponin 3.02. Pharmacy consulted to dose heparin for ACS.   Goal of Therapy:  Heparin level 0.3-0.7 units/ml Monitor platelets by anticoagulation protocol: Yes   Plan:  Will continue heparin drip at current rate and recheck a HL in 8 hours.   5/28: HL @ 20:03 = 0.22 Will order Heparin 1350 units IV X 1 and increase drip rate to 1650 units/hr. Will recheck HL 8 hrs after rate change.   05/29 AM heparin level 0.45. Continue current regimen. Recheck in 8  hours to confirm.  Waleed Dettman S, Pharm.D.,  Clinical Pharmacist 08/27/2017,6:30 AM

## 2017-08-27 NOTE — Progress Notes (Signed)
[employer] [facility] [place] [facility]   PATIENT NAME: Kimberly Montgomery    MR#:  [id number]  DATE OF BIRTH:  [birth year]  SUBJECTIVE:  CHIEF COMPLAINT:   Chief Complaint  Patient presents with  . Altered Mental Status  Cardiology, nephrology, intensivist input appreciated, for hemodialysis, heart catheterization on tomorrow REVIEW OF SYSTEMS:  CONSTITUTIONAL: No fever, fatigue or weakness.  EYES: No blurred or double vision.  EARS, NOSE, AND THROAT: No tinnitus or ear pain.  RESPIRATORY: No cough, shortness of breath, wheezing or hemoptysis.  CARDIOVASCULAR: No chest pain, orthopnea, edema.  GASTROINTESTINAL: No nausea, vomiting, diarrhea or abdominal pain.  GENITOURINARY: No dysuria, hematuria.  ENDOCRINE: No polyuria, nocturia,  HEMATOLOGY: No anemia, easy bruising or bleeding SKIN: No rash or lesion. MUSCULOSKELETAL: No joint pain or arthritis.   NEUROLOGIC: No tingling, numbness, weakness.  PSYCHIATRY: No anxiety or depression.   ROS  DRUG ALLERGIES:   Allergies  Allergen Reactions  . Cephalosporins Anaphylaxis    Patient has tolerated meropenem.   [name] Penicillins Anaphylaxis and Other (See Comments)    Has patient had a PCN reaction causing immediate rash, facial/tongue/throat swelling, SOB or lightheadedness with hypotension: Yes Has patient had a PCN reaction causing severe rash involving mucus membranes or skin necrosis: No Has patient had a PCN reaction that required hospitalization No Has patient had a PCN reaction occurring within the last 10 years: No If all of the above answers are "NO", then may proceed with Cephalosporin use.  . Reglan [Metoclopramide] Other (See Comments)    Severe EPS (lip, head, body tremors) March 2018  . Risperdal [Risperidone] Other (See Comments)    Severe EPS (lip, head, body tremors) March 2018  . Lamictal [Lamotrigine] Other (See Comments)    Reaction:  Hallucinations  . Phenergan [Promethazine Hcl] Nausea And Vomiting    Sensitivity to medicine  . Pravastatin Other (See Comments)    Reaction:  Muscle pain   . Sulfa Antibiotics Other (See Comments)    Reaction:  Unknown     VITALS:  Blood pressure (!) 157/68, pulse 68, temperature 98.4 F (36.9 C), temperature source Axillary, resp. rate 18, height 5\' 9"  (1.753 m), weight 112.9 kg (248 lb 14.4 oz), last menstrual period 03/16/2015, SpO2 98 %.  PHYSICAL EXAMINATION:  GENERAL:  52 y.o.-year-old patient lying in the bed with no acute distress.  EYES: Pupils equal, round, reactive to light and accommodation. No scleral icterus. Extraocular muscles intact.  HEENT: Head atraumatic, normocephalic. Oropharynx and nasopharynx clear.  NECK:  Supple, no jugular venous distention. No thyroid enlargement, no tenderness.  LUNGS: Normal breath sounds bilaterally, no wheezing, rales,rhonchi or crepitation. No use of accessory muscles of respiration.  CARDIOVASCULAR: S1, S2 normal. No murmurs, rubs, or gallops.  ABDOMEN: Soft, nontender, nondistended. Bowel sounds present. No organomegaly or mass.  EXTREMITIES: No pedal edema, cyanosis, or clubbing.  NEUROLOGIC: Cranial nerves II through XII are intact. Muscle strength 5/5 in all extremities. Sensation intact. Gait not checked.  PSYCHIATRIC: The patient is alert and oriented x 3.  SKIN: No obvious rash, lesion, or ulcer.   Physical Exam LABORATORY PANEL:   CBC Recent Labs  Lab 08/27/17 0539  WBC 6.6  HGB 9.0*  HCT 28.0*  PLT 238   ------------------------------------------------------------------------------------------------------------------  Chemistries  Recent Labs  Lab 08/21/17 2236  08/23/17 0522  NA 131*   < > 136  K 6.2*   < > 4.3  CL 93*   < > 96*  CO2 26   < >  28  GLUCOSE 90   < > 100*  BUN 37*   < > 19  CREATININE 6.39*   < > 4.46*  CALCIUM 9.1   < > 8.5*  MG  --    < > 1.8  AST 15  --   --   ALT 9*  --   --   ALKPHOS 151*  --   --   BILITOT 0.5  --   --    < > = values in this  interval not displayed.   ------------------------------------------------------------------------------------------------------------------  Cardiac Enzymes Recent Labs  Lab 08/25/17 2236 08/26/17 1840  TROPONINI 2.45* 1.86*   ------------------------------------------------------------------------------------------------------------------  RADIOLOGY:  Dg Chest Port 1 View  Result Date: 08/27/2017 CLINICAL DATA:  Pneumonia. EXAM: PORTABLE CHEST 1 VIEW COMPARISON:  Radiograph of Aug 25, 2017. FINDINGS: Stable cardiomegaly and central pulmonary vascular congestion is noted. No pneumothorax is noted. Mild bibasilar subsegmental atelectasis or edema is noted with possible small pleural effusions. Bony thorax is unremarkable. IMPRESSION: Stable cardiomegaly and central pulmonary vascular congestion. Mild bibasilar subsegmental atelectasis or edema is noted with possible small pleural effusions. Electronically Signed   By: Lupita Raider, M.D.   On: 08/27/2017 07:20   Dg Chest Port 1 View  Result Date: 08/25/2017 CLINICAL DATA:  Acute onset of shortness of breath. EXAM: PORTABLE CHEST 1 VIEW COMPARISON:  Chest radiograph performed 08/21/2017, and CTA of the chest performed earlier today at 6:34 a.m. FINDINGS: The lungs are well-aerated. Vascular congestion is noted. Increased interstitial markings may reflect mild interstitial edema. Small bilateral pleural effusions are suspected. There is no evidence of pneumothorax. The cardiomediastinal silhouette is mildly enlarged. No acute osseous abnormalities are seen. IMPRESSION: Vascular congestion and mild cardiomegaly. Increased interstitial markings may reflect mild interstitial edema. Small bilateral pleural effusions suspected. Electronically Signed   By: Roanna Raider M.D.   On: 08/25/2017 18:38    ASSESSMENT AND PLAN:  *Acute non-STEMI Stable For heart catheter tomorrow Heparin drip, aspirin, Plavix, statin therapy, metoprolol  *Acute  hypoxic respiratory failure Most likely secondary to above Supplemental oxygen wean as tolerated  *Acute on chronic diastolic congestive heart failure exacerbation Exacerbated by end-stage renal disease/fluid overload state Nephrology input appreciated, for hemodialysis, echocardiogram noted for stage II diastolic dysfunction  *Chronic end-stage renal disease Plan of care as stated above  *Acute hypertensive urgency Improved Continue current regiment  *Acute enterococcus UTI Resolving Continue Zyvox  *Acute nonspecific mediastinal lymphadenopathy Will need to follow-up with pulmonary status post discharge in 3 months s/p in 3 weeks for CT chest   * DMII Stable on current regiment    All the records are reviewed and case discussed with Care Management/Social Workerr. Management plans discussed with the patient, family and they are in agreement.  CODE STATUS: full  TOTAL TIME TAKING CARE OF THIS PATIENT: 35 minutes.     POSSIBLE D/C IN 3-8 DAYS, DEPENDING ON CLINICAL CONDITION.   Evelena Asa Martinique Pizzimenti M.D on 08/27/2017   Between 7am to 6pm - Pager - [phone]  After 6pm go to [email or web address] - password [email or web address]  [facility]  Office  [phone]  CC: Primary care physician; Laurier Nancy, MD  Note: This dictation was prepared with Dragon dictation along with smaller phrase technology. Any transcriptional errors that result from this process are unintentional.

## 2017-08-28 ENCOUNTER — Inpatient Hospital Stay: Payer: Medicare HMO

## 2017-08-28 ENCOUNTER — Encounter
Admission: EM | Disposition: A | Discharge status: Skilled Nursing Facility | Payer: Self-pay | Source: Home / Self Care | Attending: Internal Medicine

## 2017-08-28 HISTORY — PX: CORONARY STENT INTERVENTION: CATH118234

## 2017-08-28 HISTORY — PX: RIGHT/LEFT HEART CATH AND CORONARY ANGIOGRAPHY: CATH118266

## 2017-08-28 LAB — BLOOD GAS, ARTERIAL
Acid-Base Excess: 9.7 mmol/L — ABNORMAL HIGH (ref 0.0–2.0)
BICARBONATE: 35.9 mmol/L — AB (ref 20.0–28.0)
FIO2: 0.24
O2 Saturation: 97.7 %
PATIENT TEMPERATURE: 37
pCO2 arterial: 58 mmHg — ABNORMAL HIGH (ref 32.0–48.0)
pH, Arterial: 7.4 (ref 7.350–7.450)
pO2, Arterial: 99 mmHg (ref 83.0–108.0)

## 2017-08-28 LAB — BASIC METABOLIC PANEL
ANION GAP: 10 (ref 5–15)
BUN: 20 mg/dL (ref 6–20)
CALCIUM: 8.6 mg/dL — AB (ref 8.9–10.3)
CHLORIDE: 97 mmol/L — AB (ref 101–111)
CO2: 30 mmol/L (ref 22–32)
CREATININE: 3.3 mg/dL — AB (ref 0.44–1.00)
GFR calc non Af Amer: 15 mL/min — ABNORMAL LOW (ref >60)
GFR, EST AFRICAN AMERICAN: 17 mL/min — AB (ref >60)
Glucose, Bld: 121 mg/dL — ABNORMAL HIGH (ref 65–99)
Potassium: 3.9 mmol/L (ref 3.5–5.1)
Sodium: 137 mmol/L (ref 135–145)

## 2017-08-28 LAB — CBC WITH DIFFERENTIAL/PLATELET
BASOS ABS: 0 10*3/uL (ref 0–0.1)
BASOS PCT: 1 %
EOS ABS: 0.2 10*3/uL (ref 0–0.7)
Eosinophils Relative: 3 %
HCT: 28.9 % — ABNORMAL LOW (ref 35.0–47.0)
HEMOGLOBIN: 9.4 g/dL — AB (ref 12.0–16.0)
Lymphocytes Relative: 16 %
Lymphs Abs: 1.1 10*3/uL (ref 1.0–3.6)
MCH: 28.7 pg (ref 26.0–34.0)
MCHC: 32.6 g/dL (ref 32.0–36.0)
MCV: 88.1 fL (ref 80.0–100.0)
MONOS PCT: 7 %
Monocytes Absolute: 0.4 10*3/uL (ref 0.2–0.9)
NEUTROS ABS: 4.7 10*3/uL (ref 1.4–6.5)
NEUTROS PCT: 73 %
Platelets: 250 10*3/uL (ref 150–440)
RBC: 3.28 MIL/uL — ABNORMAL LOW (ref 3.80–5.20)
RDW: 16.1 % — AB (ref 11.5–14.5)
WBC: 6.4 10*3/uL (ref 3.6–11.0)

## 2017-08-28 LAB — GLUCOSE, CAPILLARY
GLUCOSE-CAPILLARY: 100 mg/dL — AB (ref 65–99)
Glucose-Capillary: 105 mg/dL — ABNORMAL HIGH (ref 65–99)
Glucose-Capillary: 110 mg/dL — ABNORMAL HIGH (ref 65–99)
Glucose-Capillary: 115 mg/dL — ABNORMAL HIGH (ref 65–99)
Glucose-Capillary: 120 mg/dL — ABNORMAL HIGH (ref 65–99)
Glucose-Capillary: 122 mg/dL — ABNORMAL HIGH (ref 65–99)
Glucose-Capillary: 145 mg/dL — ABNORMAL HIGH (ref 65–99)

## 2017-08-28 LAB — POCT ACTIVATED CLOTTING TIME: ACTIVATED CLOTTING TIME: 290 s

## 2017-08-28 LAB — HEPARIN LEVEL (UNFRACTIONATED): HEPARIN UNFRACTIONATED: 0.31 [IU]/mL (ref 0.30–0.70)

## 2017-08-28 SURGERY — RIGHT/LEFT HEART CATH AND CORONARY ANGIOGRAPHY
Anesthesia: Moderate Sedation

## 2017-08-28 MED ORDER — ONDANSETRON HCL 4 MG/2ML IJ SOLN: 4.0000 mg | Freq: Four times a day (QID) | INTRAMUSCULAR | Status: DC | PRN

## 2017-08-28 MED ORDER — BIVALIRUDIN TRIFLUOROACETATE 250 MG IV SOLR
INTRAVENOUS | Status: AC
  Filled 2017-08-28: qty 250

## 2017-08-28 MED ORDER — SODIUM CHLORIDE 0.9 % IV SOLN: INTRAVENOUS | Status: DC

## 2017-08-28 MED ORDER — IOPAMIDOL (ISOVUE-300) INJECTION 61%
INTRAVENOUS | Status: DC | PRN
  Administered 2017-08-28: 140 mL via INTRA_ARTERIAL

## 2017-08-28 MED ORDER — LIDOCAINE HCL (PF) 1 % IJ SOLN
INTRAMUSCULAR | Status: AC
  Filled 2017-08-28: qty 30

## 2017-08-28 MED ORDER — HYDRALAZINE HCL 20 MG/ML IJ SOLN: 5.0000 mg | INTRAMUSCULAR | Status: AC | PRN

## 2017-08-28 MED ORDER — SODIUM CHLORIDE 0.9% FLUSH
3.0000 mL | Freq: Two times a day (BID) | INTRAVENOUS | Status: DC
  Administered 2017-08-28: 3 mL via INTRAVENOUS

## 2017-08-28 MED ORDER — SODIUM CHLORIDE 0.9% FLUSH
3.0000 mL | Freq: Two times a day (BID) | INTRAVENOUS | Status: DC
  Administered 2017-08-28 – 2017-08-30 (×4): 3 mL via INTRAVENOUS

## 2017-08-28 MED ORDER — LABETALOL HCL 5 MG/ML IV SOLN
INTRAVENOUS | Status: AC
  Filled 2017-08-28: qty 4

## 2017-08-28 MED ORDER — SODIUM CHLORIDE 0.9 % WEIGHT BASED INFUSION: 1.0000 mL/kg/h | INTRAVENOUS | Status: DC

## 2017-08-28 MED ORDER — SODIUM CHLORIDE 0.9% FLUSH: 3.0000 mL | INTRAVENOUS | Status: DC | PRN

## 2017-08-28 MED ORDER — CLOPIDOGREL BISULFATE 75 MG PO TABS
ORAL_TABLET | ORAL | Status: AC
  Filled 2017-08-28: qty 4

## 2017-08-28 MED ORDER — FENTANYL CITRATE (PF) 100 MCG/2ML IJ SOLN
INTRAMUSCULAR | Status: AC
  Filled 2017-08-28: qty 2

## 2017-08-28 MED ORDER — MIDAZOLAM HCL 2 MG/2ML IJ SOLN
INTRAMUSCULAR | Status: AC
  Filled 2017-08-28: qty 2

## 2017-08-28 MED ORDER — CLOPIDOGREL BISULFATE 75 MG PO TABS
75.0000 mg | ORAL_TABLET | Freq: Every day | ORAL | Status: DC
  Administered 2017-08-29 – 2017-08-30 (×2): 75 mg via ORAL
  Filled 2017-08-28 (×2): qty 1

## 2017-08-28 MED ORDER — SODIUM CHLORIDE 0.9% FLUSH: 3.0000 mL | Freq: Two times a day (BID) | INTRAVENOUS | Status: DC

## 2017-08-28 MED ORDER — MIDAZOLAM HCL 2 MG/2ML IJ SOLN
INTRAMUSCULAR | Status: DC | PRN
  Administered 2017-08-28: 1 mg via INTRAVENOUS

## 2017-08-28 MED ORDER — SODIUM CHLORIDE 0.9 % IV SOLN: 250.0000 mL | INTRAVENOUS | Status: DC | PRN

## 2017-08-28 MED ORDER — LABETALOL HCL 5 MG/ML IV SOLN: 10.0000 mg | INTRAVENOUS | Status: AC | PRN

## 2017-08-28 MED ORDER — SODIUM CHLORIDE 0.9 % IV SOLN
250.0000 mL | INTRAVENOUS | Status: DC | PRN
  Administered 2017-08-28: 250 mL via INTRAVENOUS

## 2017-08-28 MED ORDER — SODIUM CHLORIDE 0.9 % IV SOLN
INTRAVENOUS | Status: AC | PRN
  Administered 2017-08-28: 1 mg/kg/h via INTRAVENOUS

## 2017-08-28 MED ORDER — ACETAMINOPHEN 325 MG PO TABS: 650.0000 mg | ORAL_TABLET | ORAL | Status: DC | PRN

## 2017-08-28 MED ORDER — ASPIRIN 81 MG PO CHEW
CHEWABLE_TABLET | ORAL | Status: DC | PRN
  Administered 2017-08-28: 243 mg via ORAL

## 2017-08-28 MED ORDER — LABETALOL HCL 5 MG/ML IV SOLN
INTRAVENOUS | Status: DC | PRN
  Administered 2017-08-28 (×2): 10 mg via INTRAVENOUS

## 2017-08-28 MED ORDER — IOPAMIDOL (ISOVUE-300) INJECTION 61%
INTRAVENOUS | Status: DC | PRN
  Administered 2017-08-28: 110 mL via INTRAVENOUS

## 2017-08-28 MED ORDER — NITROGLYCERIN 5 MG/ML IV SOLN
INTRAVENOUS | Status: AC
  Filled 2017-08-28: qty 10

## 2017-08-28 MED ORDER — FENTANYL CITRATE (PF) 100 MCG/2ML IJ SOLN
INTRAMUSCULAR | Status: DC | PRN
  Administered 2017-08-28: 25 ug via INTRAVENOUS

## 2017-08-28 MED ORDER — BIVALIRUDIN BOLUS VIA INFUSION - CUPID
INTRAVENOUS | Status: DC | PRN
  Administered 2017-08-28: 86.1 mg via INTRAVENOUS

## 2017-08-28 MED ORDER — CLOPIDOGREL BISULFATE 75 MG PO TABS
ORAL_TABLET | ORAL | Status: DC | PRN
  Administered 2017-08-28: 300 mg via ORAL

## 2017-08-28 MED ORDER — ASPIRIN 81 MG PO CHEW: 81.0000 mg | CHEWABLE_TABLET | ORAL | Status: DC

## 2017-08-28 MED ORDER — ASPIRIN 81 MG PO CHEW
CHEWABLE_TABLET | ORAL | Status: AC
  Filled 2017-08-28: qty 3

## 2017-08-28 MED ORDER — ASPIRIN 81 MG PO CHEW
81.0000 mg | CHEWABLE_TABLET | Freq: Every day | ORAL | Status: DC
  Administered 2017-08-28 – 2017-08-30 (×3): 81 mg via ORAL
  Filled 2017-08-28 (×3): qty 1

## 2017-08-28 SURGICAL SUPPLY — 21 items
BALLN TREK RX 2.5X15 (BALLOONS) ×4
BALLN ~~LOC~~ TREK RX 2.5X12 (BALLOONS) ×4
BALLOON TREK RX 2.5X15 (BALLOONS) ×2 IMPLANT
BALLOON ~~LOC~~ TREK RX 2.5X12 (BALLOONS) ×2 IMPLANT
CATH INFINITI 5FR ANG PIGTAIL (CATHETERS) ×4 IMPLANT
CATH INFINITI 5FR JL4 (CATHETERS) ×4 IMPLANT
CATH INFINITI JR4 5F (CATHETERS) ×4 IMPLANT
CATH SWANZ 7F THERMO (CATHETERS) ×4 IMPLANT
CATH VISTA GUIDE 6FR XB3.5 (CATHETERS) ×4 IMPLANT
DEVICE CLOSURE MYNXGRIP 6/7F (Vascular Products) ×8 IMPLANT
DEVICE INFLAT 30 PLUS (MISCELLANEOUS) ×4 IMPLANT
KIT MANI 3VAL PERCEP (MISCELLANEOUS) ×4 IMPLANT
KIT RIGHT HEART (MISCELLANEOUS) ×4 IMPLANT
NEEDLE PERC 18GX7CM (NEEDLE) ×4 IMPLANT
PACK CARDIAC CATH (CUSTOM PROCEDURE TRAY) ×4 IMPLANT
SHEATH AVANTI 5FR X 11CM (SHEATH) ×4 IMPLANT
SHEATH AVANTI 6FR X 11CM (SHEATH) ×4 IMPLANT
SHEATH AVANTI 7FRX11 (SHEATH) ×4 IMPLANT
STENT SIERRA 2.50 X 15 MM (Permanent Stent) ×4 IMPLANT
WIRE G HI TQ BMW 190 (WIRE) ×4 IMPLANT
WIRE GUIDERIGHT .035X150 (WIRE) ×4 IMPLANT

## 2017-08-28 NOTE — Progress Notes (Signed)
Post HD assessment   08/28/17 0212  Neurological  Level of Consciousness Alert  Orientation Level Oriented X4  Respiratory  Respiratory Pattern Regular;Unlabored  Cardiac  ECG Monitor Yes  Vascular  R Radial Pulse +2  L Radial Pulse +2  Edema Generalized  Integumentary  Integumentary (WDL) X  Skin Color Appropriate for ethnicity  Musculoskeletal  Musculoskeletal (WDL) X  Generalized Weakness Yes  Assistive Device None  GU Assessment  Genitourinary (WDL) X  Genitourinary Symptoms  (HD)  Psychosocial  Psychosocial (WDL) WDL

## 2017-08-28 NOTE — Progress Notes (Signed)
SUBJECTIVE: Has SOB and chest pain   Vitals:   08/28/17 0900 08/28/17 0915 08/28/17 1000 08/28/17 1104  BP: (!) 183/67 (!) 161/72 (!) 165/66 (!) 147/60  Pulse: 78 78 76 65  Resp: (!) 27 20 17 20   Temp:    98.9 F (37.2 C)  TempSrc:    Oral  SpO2: 97% 99% 99% 94%  Weight:      Height:        Intake/Output Summary (Last 24 hours) at 08/28/2017 1225 Last data filed at 08/28/2017 1000 Gross per 24 hour  Intake 976 ml  Output 2511 ml  Net -1535 ml    LABS: Basic Metabolic Panel: Recent Labs    08/27/17 1600 08/28/17 0503  NA  --  137  K  --  3.9  CL  --  97*  CO2  --  30  GLUCOSE  --  121*  BUN  --  20  CREATININE  --  3.30*  CALCIUM  --  8.6*  PHOS 4.7*  --    Liver Function Tests: No results for input(s): AST, ALT, ALKPHOS, BILITOT, PROT, ALBUMIN in the last 72 hours. No results for input(s): LIPASE, AMYLASE in the last 72 hours. CBC: Recent Labs    08/27/17 0539 08/28/17 0503  WBC 6.6 6.4  NEUTROABS 4.5 4.7  HGB 9.0* 9.4*  HCT 28.0* 28.9*  MCV 88.1 88.1  PLT 238 250   Cardiac Enzymes: Recent Labs    08/25/17 1815 08/25/17 2236 08/26/17 1840  TROPONINI 2.84* 2.45* 1.86*   BNP: Invalid input(s): POCBNP D-Dimer: No results for input(s): DDIMER in the last 72 hours. Hemoglobin A1C: No results for input(s): HGBA1C in the last 72 hours. Fasting Lipid Panel: No results for input(s): CHOL, HDL, LDLCALC, TRIG, CHOLHDL, LDLDIRECT in the last 72 hours. Thyroid Function Tests: No results for input(s): TSH, T4TOTAL, T3FREE, THYROIDAB in the last 72 hours.  Invalid input(s): FREET3 Anemia Panel: No results for input(s): VITAMINB12, FOLATE, FERRITIN, TIBC, IRON, RETICCTPCT in the last 72 hours.   PHYSICAL EXAM General: Well developed, well nourished, in no acute distress HEENT:  Normocephalic and atramatic Neck:  No JVD.  Lungs: Clear bilaterally to auscultation and percussion. Heart: HRRR . Normal S1 and S2 without gallops or murmurs.  Abdomen: Bowel  sounds are positive, abdomen soft and non-tender  Msk:  Back normal, normal gait. Normal strength and tone for age. Extremities: No clubbing, cyanosis or edema.   Neuro: Alert and oriented X 3. Psych:  Good affect, responds appropriately  TELEMETRY: NSR ASSESSMENT AND PLAN: NSTEMI/ESRD, will do right and left heart cath. Spoke to DR. Latif and is able lye flat.  Active Problems:   Acute encephalopathy    Laurier Nancy, MD, [facility] 08/28/2017 12:25 PM

## 2017-08-28 NOTE — Progress Notes (Addendum)
Name: Kimberly Montgomery MRN: [id number] DOB: 10-19-1965     CONSULTATION DATE: 08/21/2017  Subjectives & Objectives: Off NTG and remains on Heparin gtt. No chest pain.  PAST MEDICAL HISTORY :   has a past medical history of Anginal pain (HCC), Anxiety, Bipolar disorder (HCC), CAD (coronary artery disease), CHF (congestive heart failure) (HCC), Chronic lower back pain, Depression, Endometriosis, ESRD (end stage renal disease) on dialysis (HCC), Gastroparesis, GERD (gastroesophageal reflux disease), History of blood transfusion ("several"), History of hiatal hernia, HLD (hyperlipidemia), Hypertension, Migraine, Myocardial infarction (HCC) (2017), Renal disorder, Renal insufficiency, Seizures (HCC) (07/2015), Stroke (HCC), and Type II diabetes mellitus (HCC).  has a past surgical history that includes Hysterotomy; Below knee leg amputation (Right, 2010?); Cardiac catheterization (N/A, 08/30/2015); Cardiac catheterization (N/A, 01/01/2016); Abdominal hysterectomy; Tubal ligation; Peritoneal catheter removal (01/01/2016); Salpingoophorectomy (Right); Cardiac catheterization (Right, 07/10/2015); Cardiac catheterization (Right, 09/14/2015); Cardiac catheterization (N/A, 09/14/2015); Cardiac catheterization (Right, 11/20/2015); Coronary angioplasty with stent (<2017); Peritoneal catheter insertion (11/03/2013); Peritoneal catheter removal (11/03/2013; 02/11/2014); TEE without cardioversion (N/A, 06/07/2016); Cataract extraction, bilateral; Coronary angioplasty with stent; AV fistula placement (Right, 08/23/2016); and DIALYSIS/PERMA CATHETER REMOVAL (N/A, 11/12/2016). Prior to Admission medications   Medication Sig Start Date End Date Taking? Authorizing Provider  acetaminophen (TYLENOL) 500 MG tablet Take 500 mg by mouth every 6 (six) hours as needed for moderate pain or fever.   Yes [provider]  albuterol (PROVENTIL) (2.5 MG/3ML) 0.083% nebulizer solution Take 2.5 mg by nebulization every 4 (four) hours as  needed for wheezing or shortness of breath.   Yes [provider]  amLODipine (NORVASC) 10 MG tablet Take 10 mg by mouth daily.    Yes [provider]  aspirin 81 MG chewable tablet Chew 81 mg by mouth daily.   Yes [provider]  atorvastatin (LIPITOR) 40 MG tablet Take 40 mg by mouth daily at 6 PM.   Yes [provider]  benztropine (COGENTIN) 0.5 MG tablet Take 0.5 mg by mouth 2 (two) times daily.   Yes [provider]  bisacodyl (DULCOLAX) 10 MG suppository Place 10 mg rectally as needed for moderate constipation.   Yes [provider]  carboxymethylcellulose (REFRESH) 1 % ophthalmic solution Place 1 drop into both eyes 3 (three) times daily.   Yes [provider]  clonazePAM (KLONOPIN) 0.5 MG tablet Take  tablet (0.25MG ) by mouth every morning and 1 tablet (0.5MG ) by mouth every evening   Yes [provider]  clopidogrel (PLAVIX) 75 MG tablet Take 75 mg by mouth daily at 12 noon.    Yes [provider]  escitalopram (LEXAPRO) 5 MG tablet Take 2.5 mg by mouth daily.   Yes [provider]  fluticasone (FLONASE) 50 MCG/ACT nasal spray Place 1 spray into both nostrils daily.    Yes [provider]  gabapentin (NEURONTIN) 300 MG capsule Take 300 mg by mouth daily.    Yes [provider]  hydrALAZINE (APRESOLINE) 50 MG tablet Take 1 tablet (50MG ) by mouth three times daily (Sunday, Tuesday, Thursday & Saturday) and take 1 tablet (50MG ) by mouth twice daily (Monday, Wednesday & Friday).   Yes [provider]  HYDROcodone-acetaminophen (NORCO/VICODIN) 5-325 MG tablet Take 1 tablet by mouth every 6 (six) hours as needed for moderate pain or severe pain. 05/20/17  Yes Delfino Lovett, MD  insulin aspart (NOVOLOG) 100 UNIT/ML injection Inject 7 Units into the skin 3 (three) times daily with meals.    Yes [provider]  insulin detemir (LEVEMIR)  100 UNIT/ML injection Inject 4 Units  into the skin daily.    Yes [provider]  isosorbide mononitrate (IMDUR) 30 MG 24 hr tablet Take 90 mg by mouth daily at 12 noon.    Yes [provider]  lactulose (CHRONULAC) 10 GM/15ML solution Take 10 g by mouth daily at 6 PM.    Yes [provider]  levETIRAcetam (KEPPRA) 750 MG tablet Take 2 tablets (1,500 mg total) by mouth daily at 12 noon. 05/20/17  Yes Delfino Lovett, MD  lidocaine (LIDODERM) 5 % Place 1 patch onto the skin at bedtime.    Yes [provider]  lidocaine (LMX) 4 % cream Apply 1 application topically daily as needed (pain). To right arm fistula   Yes [provider]  linagliptin (TRADJENTA) 5 MG TABS tablet Take 5 mg by mouth daily.   Yes [provider]  lisinopril (PRINIVIL,ZESTRIL) 10 MG tablet Take 10 mg by mouth daily at 12 noon.   Yes [provider]  Melatonin 3 MG TABS Take 3 mg by mouth at bedtime.   Yes [provider]  metoprolol (LOPRESSOR) 50 MG tablet Take 1 tablet (50 mg total) by mouth 2 (two) times daily. 09/15/15  Yes Sudini, Wardell Heath, MD  multivitamin (ONE-A-DAY MEN'S) TABS tablet Take 1 tablet by mouth daily.   Yes [provider]  nitroGLYCERIN (NITROSTAT) 0.4 MG SL tablet Place 0.4 mg under the tongue every 5 (five) minutes as needed for chest pain. Reported on 07/10/2015   Yes [provider]  omeprazole (PRILOSEC) 20 MG capsule Take 20 mg by mouth 2 (two) times daily.    Yes [provider]  ondansetron (ZOFRAN ODT) 4 MG disintegrating tablet Take 1 tablet (4 mg total) by mouth every 6 (six) hours as needed for nausea or vomiting. 07/16/15  Yes Sharyn Creamer, MD  OXcarbazepine (TRILEPTAL) 150 MG tablet Take 150 mg by mouth at bedtime.   Yes [provider]  polyethylene glycol (MIRALAX / GLYCOLAX) packet Take 17 g by mouth daily. Patient taking differently: Take 17 g by mouth daily at 12 noon.  08/02/15  Yes Adrian Saran, MD  senna (SENOKOT) 8.6 MG TABS  tablet Take 2 tablets by mouth 2 (two) times daily.    Yes [provider]  sevelamer carbonate (RENVELA) 800 MG tablet Take 800 mg by mouth 4 (four) times daily -  before meals and at bedtime.    Yes [provider]  torsemide (DEMADEX) 100 MG tablet Take 100 mg by mouth daily.    Yes [provider]   Allergies  Allergen Reactions  . Cephalosporins Anaphylaxis    Patient has tolerated meropenem.   [name] Penicillins Anaphylaxis and Other (See Comments)    Has patient had a PCN reaction causing immediate rash, facial/tongue/throat swelling, SOB or lightheadedness with hypotension: Yes Has patient had a PCN reaction causing severe rash involving mucus membranes or skin necrosis: No Has patient had a PCN reaction that required hospitalization No Has patient had a PCN reaction occurring within the last 10 years: No If all of the above answers are "NO", then may proceed with Cephalosporin use.  . Reglan [Metoclopramide] Other (See Comments)    Severe EPS (lip, head, body tremors) March 2018  . Risperdal [Risperidone] Other (See Comments)    Severe EPS (lip, head, body tremors) March 2018  . Lamictal [Lamotrigine] Other (See Comments)    Reaction:  Hallucinations  . Phenergan [Promethazine Hcl] Nausea And Vomiting  Sensitivity to medicine  . Pravastatin Other (See Comments)    Reaction:  Muscle pain   . Sulfa Antibiotics Other (See Comments)    Reaction:  Unknown     FAMILY HISTORY:  family history includes Bipolar disorder in her unknown relative; CAD in her unknown relative; Cervical cancer in her mother; Diabetes in her unknown relative; Other in her father. SOCIAL HISTORY:  reports that she quit smoking about 2 years ago. Her smoking use included cigarettes. She smoked 0.75 packs per day. She has never used smokeless tobacco. She reports that she does not drink alcohol or use drugs.  REVIEW OF SYSTEMS:   Unable to obtain due to critical illness   VITAL  SIGNS: Temp:  [98.4 F (36.9 C)-98.8 F (37.1 C)] 98.7 F (37.1 C) (05/30 0219) Pulse Rate:  [61-91] 74 (05/30 0800) Resp:  [12-31] 22 (05/30 0800) BP: (148-184)/(55-78) 161/60 (05/30 0800) SpO2:  [87 %-100 %] 99 % (05/30 0800) Weight:  [114.8 kg (253 lb 1.4 oz)-119.6 kg (263 lb 10.7 oz)] 114.8 kg (253 lb 1.4 oz) (05/30 0219)  Physical Examination:  Awake and oriented with no focal neurological deficits [place], no distress, able to talk in full sentences, bilateral equal air entry and no adventitious sounds S1 & S2 are audible with no murmur Benign obese abdomen was normal peristalsis Wasted extremities no edema on the right below-knee amputation  ASSESSMENT / PLAN:  Acute respiratory failure tolerating [place]. -Monitor ABG, work of breathing, or facet and optimize O2 therapy.  NSTEMI.cardiac enzymesare trending down.  Echo in 08/26/2017 consistent with LVEF 55 to 60%, no regional wall motion abnormalities, and grade 2 diastolic dysfunction. TEE 05/2017, LVEF 55-60% and no wall motion abnormality.History of CAD status post PCI and 2 stents 10 years ago as per patient -Heparin gtt + ASA + Plavix + Statin + BB. -Plan for coronary angiogram today as per cardiology pending the patient being able to lay down flat with no respiratory distress.  Hypertensive emergency with acute coronary syndrome, improve and off NTG. Gtt. -Optimize antihypertensives and monitor hemodynamics  Atelectasis and possible pneumonia with infective etiology can not be ruled out. improved Bibasilar airspace disease -Zyvox + Azactam. Monitor CXR + CBC + FIO2.  UTI enterococcal infection -Zyvox  Non specific incidental mediastinal adenopathy.  -Follow up CT in 3 weeks and consider pulmonary consult.  End-stage renal disease on HD as per renal  Enterococcus urinary tract infection on Zyvox with history of penicillin allergy  Seizure disorder on Keppra  GERD on Protonix  Dyslipidemia on  atorvastatin  Anemia -Keep HB . 8 gm/dl.  Full code  DVT &GI prophylaxis. Continue with supportive care  Critical care time 35 minutes   15:50 Patient is back form cath lab. PCI with stenting of the proximal to mid left circumflex with drug-eluting stents. No chest pain.

## 2017-08-28 NOTE — Progress Notes (Signed)
Patient had PCI with stenting of the proximal to mid left circumflex with drug-eluting stents successfully with chest pain resolved. It was reduced from 90-95% to 0%. She also has a lesion about 40-50% in some views and 70% in other views in the mid RCA with haziness which may need to have PCI later on. Advise dialysis as soon as Dr. Cherylann Ratel thinks is necessary.

## 2017-08-28 NOTE — Progress Notes (Signed)
Physical Therapy Treatment Patient Details Name: Kimberly Montgomery MRN: [id number] DOB: 03-Sep-1965 Today's Date: 08/28/2017    History of Present Illness Pt is a51 y.o.female, NH resident,with a known history of end-stage renal disease on hemodialysis,CAD, CHF,diabetes,bipolar disorder and other comorbidities. Patient was transferred from [facility] due to acute change in mental status and lethargy.Per nursing home staff report, patient was noted to be drowsy all day, gradually getting worse.Blood test done at the facility showed hyperkalemia with potassium level is 6.2.Her oxygen saturation was noted to be low in 80s, on room air. Blood test done in the emergency room were remarkable for sodium level of 131, potassium elevated at 6.2 and creatinine elevated at 6.39. Brain CT was negative for any acute infarct or bleed.The chest x-ray showed cardiomegaly without any infiltrates. Pt was admitted for further evaluation and treatment. Assessment included acute metabolic encephalopathy, hyperkalemia, DM II, h/o seizures on keppra, HLD, and GERD.  Pt transfered to the floor but then transferred back to ICU with new diagnosis of NSTEMI.  New orders received for continued PT services.  Assessment now includes: acute NSTEMI, acute hypoxic respiratory failure, acute on chronic diastolic CHF exacerbation, chronic end stage renal disease, acute hypertensive urgency, acute UTI, and acute nonspecific mediastinal lymphadenopathy.     PT Comments    Pt continues to present with deficits in strength and functional mobility but tolerated below therex and bed mobility well with SpO2 on 1LO2/min remaining >/= 95% throughout the session with HR WNL.  Pt also showed improved effort and performance with rolling in the bed and remains motivated to participate with PT services.  Pt will benefit from PT services in a SNF setting upon discharge to safely address above deficits for decreased caregiver assistance  and eventual return to PLOF.     Follow Up Recommendations  SNF;Supervision for mobility/OOB     Equipment Recommendations  None recommended by PT    Recommendations for Other Services       Precautions / Restrictions Precautions Precautions: Fall Restrictions Weight Bearing Restrictions: No    Mobility  Bed Mobility   Bed Mobility: Rolling Rolling: Min assist         General bed mobility comments: Min A with rolling with good effort from pt  Transfers                 General transfer comment: NT this session  Ambulation/Gait                 Stairs             Wheelchair Mobility    Modified Rankin (Stroke Patients Only)       Balance                                            Cognition Arousal/Alertness: Awake/alert Behavior During Therapy: WFL for tasks assessed/performed Overall Cognitive Status: Within Functional Limits for tasks assessed                                        Exercises Total Joint Exercises Ankle Circles/Pumps: Strengthening;Other reps (comment);Left(2 x 10 reps with manual resistance) Quad Sets: Strengthening;Both;10 reps;15 reps Gluteal Sets: Strengthening;Both;10 reps;15 reps Short Arc Quad: Strengthening;Both;Other reps (comment)(2 x 10 reps with manual resistance) Heel Slides:  AROM;Left;10 reps Hip ABduction/ADduction: Strengthening;Both;Other reps (comment)(2 x 10 reps with manual resistance) Straight Leg Raises: Strengthening;Both;Other reps (comment)(2 x 10 reps with manual resistance on the RLE) Other Exercises Other Exercises: HEP education and review for LLE APs and BLE QS and GS x 10 each 5-6x/day and BLE hip abd/add and SLR x 10 each 1-2x/day Other Exercises: LLE leg press 2 x 10 with manual resistance    General Comments        Pertinent Vitals/Pain Pain Assessment: 0-10 Pain Score: 5  Pain Location: back pain Pain Descriptors / Indicators:  Sore;Aching Pain Intervention(s): Monitored during session;Other (comment)(Pt stated is trying not to take pain meds "unless I really need them")    Home Living                      Prior Function            PT Goals (current goals can now be found in the care plan section)      Frequency    Min 2X/week      PT Plan      Co-evaluation              AM-PAC PT "6 Clicks" Daily Activity  Outcome Measure                   End of Session Equipment Utilized During Treatment: Oxygen Activity Tolerance: Patient tolerated treatment well Patient left: in bed;with call bell/phone within reach;with bed alarm set Nurse Communication: Mobility status PT Visit Diagnosis: Difficulty in walking, not elsewhere classified (R26.2);Muscle weakness (generalized) (M62.81)     Time: 6579-0383 PT Time Calculation (min) (ACUTE ONLY): 26 min  Charges:  $Therapeutic Exercise: 23-37 mins                    G Codes:       DElly Modena PT, DPT 08/28/17, 10:46 AM

## 2017-08-28 NOTE — Progress Notes (Signed)
Unable to place Right AC PIV due to limb restriction - AV fistula

## 2017-08-28 NOTE — Progress Notes (Signed)
Administered Hydralazine and Metoprolol PO post-dialysis per MAR.

## 2017-08-28 NOTE — Progress Notes (Signed)
ANTICOAGULATION CONSULT NOTE - Initial Consult  Pharmacy Consult for heparin infusion Indication: chest pain/ACS  Allergies  Allergen Reactions  . Cephalosporins Anaphylaxis    Patient has tolerated meropenem.   [name] Penicillins Anaphylaxis and Other (See Comments)    Has patient had a PCN reaction causing immediate rash, facial/tongue/throat swelling, SOB or lightheadedness with hypotension: Yes Has patient had a PCN reaction causing severe rash involving mucus membranes or skin necrosis: No Has patient had a PCN reaction that required hospitalization No Has patient had a PCN reaction occurring within the last 10 years: No If all of the above answers are "NO", then may proceed with Cephalosporin use.  . Reglan [Metoclopramide] Other (See Comments)    Severe EPS (lip, head, body tremors) March 2018  . Risperdal [Risperidone] Other (See Comments)    Severe EPS (lip, head, body tremors) March 2018  . Lamictal [Lamotrigine] Other (See Comments)    Reaction:  Hallucinations  . Phenergan [Promethazine Hcl] Nausea And Vomiting    Sensitivity to medicine  . Pravastatin Other (See Comments)    Reaction:  Muscle pain   . Sulfa Antibiotics Other (See Comments)    Reaction:  Unknown     Patient Measurements: Height: 5\' 9"  (175.3 cm) Weight: 253 lb 1.4 oz (114.8 kg) IBW/kg (Calculated) : 66.2 Heparin Dosing Weight: 91.1 kg  Vital Signs: Temp: 98.7 F (37.1 C) (05/30 0219) Temp Source: Oral (05/30 0219) BP: 173/60 (05/30 0300) Pulse Rate: 88 (05/30 0300)  Labs: Recent Labs    08/25/17 1815 08/25/17 2236 08/26/17 0308  08/26/17 1840  08/27/17 0539 08/27/17 1356 08/28/17 0503  HGB 11.0*  --  10.0*  --   --   --  9.0*  --  9.4*  HCT 33.7*  --  30.5*  --   --   --  28.0*  --  28.9*  PLT 239  --  217  --   --   --  238  --  250  APTT 36  --   --   --   --   --   --   --   --   LABPROT 13.1  --   --   --   --   --   --   --   --   INR 1.00  --   --   --   --   --   --   --   --    HEPARINUNFRC  --   --  0.27*   < >  --    < > 0.45 0.34 0.31  CREATININE  --   --   --   --   --   --   --   --  3.30*  TROPONINI 2.84* 2.45*  --   --  1.86*  --   --   --   --    < > = values in this interval not displayed.    Estimated Creatinine Clearance: 26.6 mL/min (A) (by C-G formula based on SCr of 3.3 mg/dL (H)).   Medical History: Past Medical History:  Diagnosis Date  . Anginal pain (HCC)   . Anxiety   . Bipolar disorder (HCC)   . CAD (coronary artery disease)   . CHF (congestive heart failure) (HCC)   . Chronic lower back pain   . Depression   . Endometriosis   . ESRD (end stage renal disease) on dialysis (HCC)    "[facility]; [address]; [place]; TTS" (01/19/2016)  .  Gastroparesis   . GERD (gastroesophageal reflux disease)   . History of blood transfusion "several"   "my blood would get low; low RBC"  . History of hiatal hernia   . HLD (hyperlipidemia)   . Hypertension   . Migraine    "monthly" (01/19/2016)  . Myocardial infarction (HCC) 2017   "~ 3 wks ago" (01/19/2016)  . Renal disorder   . Renal insufficiency   . Seizures (HCC) 07/2015   "I've only had the 1; don't know what from" (01/19/2016)  . Stroke (HCC)   . Type II diabetes mellitus (HCC)     Medications:  Infusions:  . sodium chloride    . [START ON 08/29/2017] sodium chloride    . aztreonam Stopped (08/28/17 0310)  . heparin 1,650 Units/hr (08/27/17 2200)  . nitroGLYCERIN Stopped (08/27/17 1115)    Assessment: 71 yof with extensive PMH including CAD s/p PCI with 2 stents 10 years ago, CHF, ESRD on HD, PVD, right BKA, HTN, DM, and seizure disorder. Had CP at end of HD session today, initial troponin 3.02. Pharmacy consulted to dose heparin for ACS.   Goal of Therapy:  Heparin level 0.3-0.7 units/ml Monitor platelets by anticoagulation protocol: Yes   Plan:  Anti-Xa level 0.34 therapeutic at 1356. Will continue heparin drip at 1650 units/hr. Therapeutic level x2. Will check anti-Xa level  and CBC in AM.   5/30 AM: HL barely therapeutic at 0.31. Will increase heparin infusion to 1700 u/hr and recheck confirmatory lab in 6 hours.   Gardner Candle, PharmD, BCPS Clinical Pharmacist 08/28/2017 5:46 AM

## 2017-08-28 NOTE — Progress Notes (Signed)
[facility]   PATIENT NAME: Kimberly Montgomery    MR#:  [id number]  DATE OF BIRTH:  [birth year]  SUBJECTIVE:  CHIEF COMPLAINT:   Chief Complaint  Patient presents with  . Altered Mental Status  D/W intensivist, for left and right heart cath later today, no events overnight  REVIEW OF SYSTEMS:  CONSTITUTIONAL: No fever, fatigue or weakness.  EYES: No blurred or double vision.  EARS, NOSE, AND THROAT: No tinnitus or ear pain.  RESPIRATORY: No cough, shortness of breath, wheezing or hemoptysis.  CARDIOVASCULAR: No chest pain, orthopnea, edema.  GASTROINTESTINAL: No nausea, vomiting, diarrhea or abdominal pain.  GENITOURINARY: No dysuria, hematuria.  ENDOCRINE: No polyuria, nocturia,  HEMATOLOGY: No anemia, easy bruising or bleeding SKIN: No rash or lesion. MUSCULOSKELETAL: No joint pain or arthritis.   NEUROLOGIC: No tingling, numbness, weakness.  PSYCHIATRY: No anxiety or depression.   ROS  DRUG ALLERGIES:   Allergies  Allergen Reactions  . Cephalosporins Anaphylaxis    Patient has tolerated meropenem.   [name] Penicillins Anaphylaxis and Other (See Comments)    Has patient had a PCN reaction causing immediate rash, facial/tongue/throat swelling, SOB or lightheadedness with hypotension: Yes Has patient had a PCN reaction causing severe rash involving mucus membranes or skin necrosis: No Has patient had a PCN reaction that required hospitalization No Has patient had a PCN reaction occurring within the last 10 years: No If all of the above answers are "NO", then may proceed with Cephalosporin use.  . Reglan [Metoclopramide] Other (See Comments)    Severe EPS (lip, head, body tremors) March 2018  . Risperdal [Risperidone] Other (See Comments)    Severe EPS (lip, head, body tremors) March 2018  . Lamictal [Lamotrigine] Other (See Comments)    Reaction:  Hallucinations  . Phenergan [Promethazine Hcl] Nausea And Vomiting    Sensitivity to medicine   . Pravastatin Other (See Comments)    Reaction:  Muscle pain   . Sulfa Antibiotics Other (See Comments)    Reaction:  Unknown     VITALS:  Blood pressure (!) 147/60, pulse 65, temperature 98.9 F (37.2 C), temperature source Oral, resp. rate 20, height 5\' 9"  (1.753 m), weight 114.8 kg (253 lb 1.4 oz), last menstrual period 03/16/2015, SpO2 91 %.  PHYSICAL EXAMINATION:  GENERAL:  52 y.o.-year-old patient lying in the bed with no acute distress.  EYES: Pupils equal, round, reactive to light and accommodation. No scleral icterus. Extraocular muscles intact.  HEENT: Head atraumatic, normocephalic. Oropharynx and nasopharynx clear.  NECK:  Supple, no jugular venous distention. No thyroid enlargement, no tenderness.  LUNGS: Normal breath sounds bilaterally, no wheezing, rales,rhonchi or crepitation. No use of accessory muscles of respiration.  CARDIOVASCULAR: S1, S2 normal. No murmurs, rubs, or gallops.  ABDOMEN: Soft, nontender, nondistended. Bowel sounds present. No organomegaly or mass.  EXTREMITIES: No pedal edema, cyanosis, or clubbing.  NEUROLOGIC: Cranial nerves II through XII are intact. Muscle strength 5/5 in all extremities. Sensation intact. Gait not checked.  PSYCHIATRIC: The patient is alert and oriented x 3.  SKIN: No obvious rash, lesion, or ulcer.   Physical Exam LABORATORY PANEL:   CBC Recent Labs  Lab 08/28/17 0503  WBC 6.4  HGB 9.4*  HCT 28.9*  PLT 250   ------------------------------------------------------------------------------------------------------------------  Chemistries  Recent Labs  Lab 08/21/17 2236  08/23/17 0522 08/28/17 0503  NA 131*   < > 136 137  K 6.2*   < > 4.3 3.9  CL 93*   < >  96* 97*  CO2 26   < > 28 30  GLUCOSE 90   < > 100* 121*  BUN 37*   < > 19 20  CREATININE 6.39*   < > 4.46* 3.30*  CALCIUM 9.1   < > 8.5* 8.6*  MG  --    < > 1.8  --   AST 15  --   --   --   ALT 9*  --   --   --   ALKPHOS 151*  --   --   --   BILITOT 0.5   --   --   --    < > = values in this interval not displayed.   ------------------------------------------------------------------------------------------------------------------  Cardiac Enzymes Recent Labs  Lab 08/25/17 2236 08/26/17 1840  TROPONINI 2.45* 1.86*   ------------------------------------------------------------------------------------------------------------------  RADIOLOGY:  Dg Chest Port 1 View  Result Date: 08/28/2017 CLINICAL DATA:  52 year old female with shortness of breath, respiratory failure. EXAM: PORTABLE CHEST 1 VIEW COMPARISON:  08/27/2017 and earlier, including chest CT 08/25/2017 FINDINGS: Portable AP semi upright view at 0350 hours. Stable mild cardiomegaly and mediastinal contours. Visualized tracheal air column is within normal limits. No pneumothorax. Stable pulmonary vascularity. Continued dense retrocardiac opacity although mildly improved left lung base ventilation. Residual veiling opacity compatible with pleural effusions seen by CT. No areas of worsening ventilation. Negative visible bowel gas pattern. IMPRESSION: 1. Mildly improved left lung base ventilation since yesterday. 2. Bilateral pleural effusions and lower lobe opacity favored due to atelectasis. Stable pulmonary vascular congestion, with perhaps mild interstitial edema. Electronically Signed   By: Odessa Fleming M.D.   On: 08/28/2017 08:33   Dg Chest Port 1 View  Result Date: 08/27/2017 CLINICAL DATA:  Pneumonia. EXAM: PORTABLE CHEST 1 VIEW COMPARISON:  Radiograph of Aug 25, 2017. FINDINGS: Stable cardiomegaly and central pulmonary vascular congestion is noted. No pneumothorax is noted. Mild bibasilar subsegmental atelectasis or edema is noted with possible small pleural effusions. Bony thorax is unremarkable. IMPRESSION: Stable cardiomegaly and central pulmonary vascular congestion. Mild bibasilar subsegmental atelectasis or edema is noted with possible small pleural effusions. Electronically Signed    By: Lupita Raider, M.D.   On: 08/27/2017 07:20    ASSESSMENT AND PLAN:  *Acute non-STEMI Improved For left and right heart catheterization later today Treated with heparin drip, aspirin, Plavix, statin therapy, metoprolol  *Acute hypoxic respiratory failure Resolved Supplemental oxygen as needed  *Acute on chronic diastolic congestive heart failure exacerbation Resolved Exacerbated by end-stage renal disease/fluid overload state Nephrology following for hemodialysis needs  Echocardiogram noted for stage II diastolic dysfunction For heart catheterization later today  *Chronic end-stage renal disease Plan of care as stated above  *Acute hypertensive urgency Improved Continue current regiment  *Acute enterococcus UTI Resolving Continue Zyvox  *Acute nonspecific mediastinal lymphadenopathy Will need to follow-up with pulmonary status post discharge in 3 months s/p in 3 weeks for CT chest   * DMII Stable on current regiment  Disposition pending clinical course  All the records are reviewed and case discussed with Care Management/Social Workerr. Management plans discussed with the patient, family and they are in agreement.  CODE STATUS: full  TOTAL TIME TAKING CARE OF THIS PATIENT: 35 minutes.     POSSIBLE D/C IN 1-2 DAYS, DEPENDING ON CLINICAL CONDITION.   Evelena Asa Salary M.D on 08/28/2017   Between 7am to 6pm - Pager - [phone]  After 6pm go to [email or web address] - Social research officer, government  [employer]  [phone]  CC: Primary care physician; Laurier Nancy, MD  Note: This dictation was prepared with Dragon dictation along with smaller phrase technology. Any transcriptional errors that result from this process are unintentional.

## 2017-08-28 NOTE — Progress Notes (Signed)
Spoke to Dr Welton Flakes to clarify adding "with possible intervention" to consent for heart cath.

## 2017-08-28 NOTE — Progress Notes (Signed)
HD tx end   08/28/17 0203  Vital Signs  Pulse Rate 84  Pulse Rate Source Monitor  Resp 17  BP (!) 184/67  BP Location Left Arm  BP Method Automatic  Patient Position (if appropriate) Lying  Oxygen Therapy  SpO2 99 %  O2 Device Nasal Cannula  O2 Flow Rate (L/min) 1 L/min  During Hemodialysis Assessment  Dialysis Fluid Bolus Normal Saline  Bolus Amount (mL) 250 mL  Intra-Hemodialysis Comments Tx completed

## 2017-08-28 NOTE — Progress Notes (Signed)
[facility] [place], [place] 08/28/17  Subjective:  Patient underwent hemodialysis yesterday. She is due for cardiac catheterization today. Ultrafiltration achieved yesterday with 2.5 kg.  Objective:  Vital signs in last 24 hours:  Temp:  [98.4 F (36.9 C)-98.8 F (37.1 C)] 98.8 F (37.1 C) (05/30 0800) Pulse Rate:  [61-91] 78 (05/30 0915) Resp:  [12-31] 20 (05/30 0915) BP: (148-184)/(55-78) 161/72 (05/30 0915) SpO2:  [76 %-100 %] 99 % (05/30 0915) Weight:  [114.8 kg (253 lb 1.4 oz)-119.6 kg (263 lb 10.7 oz)] 114.8 kg (253 lb 1.4 oz) (05/30 0219)  Weight change: 6.7 kg (14 lb 12.3 oz) Filed Weights   08/27/17 0500 08/27/17 2214 08/28/17 0219  Weight: 112.9 kg (248 lb 14.4 oz) 119.6 kg (263 lb 10.7 oz) 114.8 kg (253 lb 1.4 oz)    Intake/Output:    Intake/Output Summary (Last 24 hours) at 08/28/2017 1030 Last data filed at 08/28/2017 0900 Gross per 24 hour  Intake 1287.38 ml  Output 2511 ml  Net -1223.62 ml     Physical Exam: General: Chronically ill appearing   HEENT Ancteric  Neck supple  Pulm/lungs Scattered rhonchi, normal effort  CVS/Heart Regular rhythm  Abdomen:  Soft, NTND  Extremities: Rt BKA  Neurologic: Alert, able to follow commands and answer questions  Skin: No acute rashes  Access: Rt Upper arm AVF       Basic Metabolic Panel:  Recent Labs  Lab 08/21/17 2236 08/22/17 0633 08/22/17 1413 08/23/17 0522 08/27/17 1600 08/28/17 0503  NA 131* 129*  --  136  --  137  K 6.2* 5.9*  --  4.3  --  3.9  CL 93* 93*  --  96*  --  97*  CO2 26 26  --  28  --  30  GLUCOSE 90 99  --  100*  --  121*  BUN 37* 40*  --  19  --  20  CREATININE 6.39* 6.83*  --  4.46*  --  3.30*  CALCIUM 9.1 8.6*  --  8.5*  --  8.6*  MG  --  2.3  --  1.8  --   --   PHOS  --  5.9* 5.6* 4.7* 4.7*  --      CBC: Recent Labs  Lab 08/22/17 0633 08/25/17 1815 08/26/17 0308 08/27/17 0539 08/28/17 0503  WBC 4.8 9.8 6.6 6.6 6.4  NEUTROABS  --   --   --   4.5 4.7  HGB 11.6* 11.0* 10.0* 9.0* 9.4*  HCT 34.8* 33.7* 30.5* 28.0* 28.9*  MCV 87.5 88.0 88.2 88.1 88.1  PLT 200 239 217 238 250      Lab Results  Component Value Date   HEPBSAG Negative 06/01/2016      Microbiology:  Recent Results (from the past 240 hour(s))  MRSA PCR Screening     Status: None   Collection Time: 08/22/17  2:41 AM  Result Value Ref Range Status   MRSA by PCR NEGATIVE NEGATIVE Final    Comment:        The GeneXpert MRSA Assay (FDA approved for NASAL specimens only), is one component of a comprehensive MRSA colonization surveillance program. It is not intended to diagnose MRSA infection nor to guide or monitor treatment for MRSA infections. Performed at [facility], [address], [place], [place] [address]   Urine Culture     Status: Abnormal   Collection Time: 08/22/17 11:30 AM  Result Value Ref Range Status   Specimen Description  Final    URINE, RANDOM Performed at [facility], [address] [place], [place] [address]    Special Requests   Final    NONE Performed at [facility], [address] [place], [place] [address]    Culture >=100,000 COLONIES/mL ENTEROCOCCUS GALLINARUM (A)  Final   Report Status 08/25/2017 FINAL  Final   Organism ID, Bacteria ENTEROCOCCUS GALLINARUM (A)  Final      Susceptibility   Enterococcus gallinarum - MIC*    AMPICILLIN <=2 SENSITIVE Sensitive     LEVOFLOXACIN >=8 RESISTANT Resistant     NITROFURANTOIN <=16 SENSITIVE Sensitive     VANCOMYCIN RESISTANT Resistant     LINEZOLID 2 SENSITIVE Sensitive     * >=100,000 COLONIES/mL ENTEROCOCCUS GALLINARUM  Culture, blood (routine x 2)     Status: None   Collection Time: 08/22/17  3:01 PM  Result Value Ref Range Status   Specimen Description BLOOD HEMODIALYSIS FISTULA  Final   Special Requests   Final    BOTTLES DRAWN AEROBIC AND ANAEROBIC Blood Culture results may not be optimal due to an excessive volume of blood received  in culture bottles   Culture   Final    NO GROWTH 5 DAYS Performed at [facility], [address] [place], [place] [address]    Report Status 08/27/2017 FINAL  Final  Culture, blood (routine x 2)     Status: None   Collection Time: 08/22/17  4:11 PM  Result Value Ref Range Status   Specimen Description BLOOD HEMODIALYSIS FISTULA  Final   Special Requests   Final    BOTTLES DRAWN AEROBIC AND ANAEROBIC Blood Culture results may not be optimal due to an excessive volume of blood received in culture bottles   Culture   Final    NO GROWTH 5 DAYS Performed at [facility], [address] [place], [place] [address]    Report Status 08/27/2017 FINAL  Final    Coagulation Studies: Recent Labs    08/25/17 1815  LABPROT 13.1  INR 1.00    Urinalysis: No results for input(s): COLORURINE, LABSPEC, PHURINE, GLUCOSEU, HGBUR, BILIRUBINUR, KETONESUR, PROTEINUR, UROBILINOGEN, NITRITE, LEUKOCYTESUR in the last 72 hours.  Invalid input(s): APPERANCEUR    Imaging: Dg Chest Port 1 View  Result Date: 08/28/2017 CLINICAL DATA:  52 year old female with shortness of breath, respiratory failure. EXAM: PORTABLE CHEST 1 VIEW COMPARISON:  08/27/2017 and earlier, including chest CT 08/25/2017 FINDINGS: Portable AP semi upright view at 0350 hours. Stable mild cardiomegaly and mediastinal contours. Visualized tracheal air column is within normal limits. No pneumothorax. Stable pulmonary vascularity. Continued dense retrocardiac opacity although mildly improved left lung base ventilation. Residual veiling opacity compatible with pleural effusions seen by CT. No areas of worsening ventilation. Negative visible bowel gas pattern. IMPRESSION: 1. Mildly improved left lung base ventilation since yesterday. 2. Bilateral pleural effusions and lower lobe opacity favored due to atelectasis. Stable pulmonary vascular congestion, with perhaps mild interstitial edema. Electronically Signed   By: Odessa Fleming  M.D.   On: 08/28/2017 08:33   Dg Chest Port 1 View  Result Date: 08/27/2017 CLINICAL DATA:  Pneumonia. EXAM: PORTABLE CHEST 1 VIEW COMPARISON:  Radiograph of Aug 25, 2017. FINDINGS: Stable cardiomegaly and central pulmonary vascular congestion is noted. No pneumothorax is noted. Mild bibasilar subsegmental atelectasis or edema is noted with possible small pleural effusions. Bony thorax is unremarkable. IMPRESSION: Stable cardiomegaly and central pulmonary vascular congestion. Mild bibasilar subsegmental atelectasis or edema is noted with possible  small pleural effusions. Electronically Signed   By: Lupita Raider, M.D.   On: 08/27/2017 07:20     Medications:   . sodium chloride    . [START ON 08/29/2017] sodium chloride    . aztreonam 500 mg (08/28/17 0939)  . heparin 1,650 Units/hr (08/28/17 0600)  . nitroGLYCERIN Stopped (08/27/17 1115)   . amLODipine  5 mg Oral Daily  . [START ON 08/29/2017] aspirin  81 mg Oral Pre-Cath  . atorvastatin  40 mg Oral q1800  . budesonide (PULMICORT) nebulizer solution  0.5 mg Nebulization BID  . clopidogrel  75 mg Oral Q1200  . docusate sodium  100 mg Oral BID  . epoetin (EPOGEN/PROCRIT) injection  4,000 Units Intravenous Q M,W,F-HD  . escitalopram  2.5 mg Oral Daily  . fluticasone  1 spray Each Nare Daily  . gabapentin  300 mg Oral Daily  . hydrALAZINE  50 mg Oral 3 times per day on Sun Tue Thu Sat   And  . hydrALAZINE  50 mg Oral 2 times per day on Mon Wed Fri  . insulin aspart  0-9 Units Subcutaneous Q4H  . ipratropium-albuterol  3 mL Nebulization Q6H  . isosorbide mononitrate  90 mg Oral Daily  . lactulose  10 g Oral q1800  . levETIRAcetam  1,500 mg Oral q1800  . lidocaine  1 patch Transdermal QHS  . linezolid  600 mg Oral Q12H  . mouth rinse  15 mL Mouth Rinse BID  . Melatonin  5 mg Oral QHS  . metoprolol tartrate  50 mg Oral BID  . multivitamin with minerals  1 tablet Oral Daily  . OXcarbazepine  150 mg Oral QHS  . pantoprazole  40 mg  Oral Daily  . polyethylene glycol  17 g Oral Q1200  . polyvinyl alcohol  1 drop Both Eyes TID  . sevelamer carbonate  800 mg Oral TID AC & HS  . sodium chloride flush  3 mL Intravenous Q12H   sodium chloride, acetaminophen **OR** acetaminophen, benztropine, bisacodyl, bisacodyl, clonazePAM **AND** [DISCONTINUED] clonazePAM, hydrALAZINE, HYDROcodone-acetaminophen, lip balm, nitroGLYCERIN, ondansetron **OR** ondansetron (ZOFRAN) IV, sodium chloride flush, sodium phosphate, traZODone  Assessment/ Plan:  52 y.o. caucaisan female  female with end stage renal disease on hemodialysis, COPD/asthma, diabetes mellitus type II, congestive heart failure, coronary artery disease. 5/24- admitted for altered mental status   [facility]/ [address]/ MWF 107kg Right AVF  1. End Stage Renal Disease with hyperkalemia -Patient seen at bedside.  No indication for dialysis at the moment.  Good ultrafiltration yesterday.  Able to lay flat for the cardiac catheterization.  2. 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  Maintain the patient on Epogen.  3. SHPTH -Phosphorus was 4.7 on last check indicating good control.  Maintain the patient on Renvela.  4. Altered mental status and back pain -Brain MRI was negative for acute CVA.  Thoracic MRI also normal.  Appears resolved.  5.  NSTEMI.  Cardiac catheterization scheduled for today.    LOS: 6 Kimberly Montgomery 5/30/201910:30 AM  [facility] [place], [place] [phone]  Note: This note was prepared with Dragon dictation. Any transcription errors are unintentional

## 2017-08-28 NOTE — Clinical Social Work Note (Signed)
No changes for disposition of discharge plan. From [facility]. York Spaniel MSW,LCSW [phone]

## 2017-08-28 NOTE — Progress Notes (Signed)
Report called to 2A, Forensic scientist. Patient to move to room 247. Left message with patient's daughter about transfer.

## 2017-08-28 NOTE — Progress Notes (Signed)
Post HD assessment. Pt tolerated tx well without c/o or complications. Net UF 2511, goal met   08/28/17 0219  Vital Signs  Temp 98.7 F (37.1 C)  Temp Source Oral  Pulse Rate 88  Pulse Rate Source Monitor  Resp 18  BP (!) 175/71  BP Location Left Arm  BP Method Automatic  Patient Position (if appropriate) Lying  Oxygen Therapy  SpO2 98 %  O2 Device Nasal Cannula  O2 Flow Rate (L/min) 1 L/min  Dialysis Weight  Weight 114.8 kg (253 lb 1.4 oz)  Type of Weight Post-Dialysis  Post-Hemodialysis Assessment  Rinseback Volume (mL) 250 mL  KECN 77.5 V  Dialyzer Clearance Lightly streaked  Duration of HD Treatment -hour(s) 3.5 hour(s)  Hemodialysis Intake (mL) 500 mL  UF Total -Machine (mL) 3011 mL  Net UF (mL) 2511 mL  Tolerated HD Treatment Yes  AVG/AVF Arterial Site Held (minutes) 10 minutes  AVG/AVF Venous Site Held (minutes) 10 minutes  Education / Care Plan  Dialysis Education Provided Yes  Documented Education in Care Plan Yes  Fistula / Graft Right Upper arm Arteriovenous fistula  Placement Date/Time: 08/23/16 0915   Orientation: Right  Access Location: Upper arm  Access Type: Arteriovenous fistula  Site Condition No complications  Fistula / Graft Assessment Present;Thrill;Bruit  Status Deaccessed  Drainage Description None

## 2017-08-29 ENCOUNTER — Encounter: Payer: Self-pay | Admitting: Cardiovascular Disease

## 2017-08-29 LAB — GLUCOSE, CAPILLARY
GLUCOSE-CAPILLARY: 134 mg/dL — AB (ref 65–99)
GLUCOSE-CAPILLARY: 101 mg/dL — AB (ref 65–99)
Glucose-Capillary: 159 mg/dL — ABNORMAL HIGH (ref 65–99)
Glucose-Capillary: 97 mg/dL (ref 65–99)

## 2017-08-29 LAB — PHOSPHORUS: Phosphorus: 5 mg/dL — ABNORMAL HIGH (ref 2.5–4.6)

## 2017-08-29 MED ORDER — SENNOSIDES-DOCUSATE SODIUM 8.6-50 MG PO TABS
2.0000 | ORAL_TABLET | Freq: Every day | ORAL | Status: DC
  Administered 2017-08-29: 2 via ORAL
  Filled 2017-08-29: qty 2

## 2017-08-29 MED ORDER — LACTULOSE 10 GM/15ML PO SOLN
30.0000 g | Freq: Once | ORAL | Status: AC
  Administered 2017-08-29: 30 g via ORAL
  Filled 2017-08-29: qty 60

## 2017-08-29 MED ORDER — DOCUSATE SODIUM 100 MG PO CAPS
200.0000 mg | ORAL_CAPSULE | Freq: Two times a day (BID) | ORAL | Status: DC
  Administered 2017-08-29: 200 mg via ORAL

## 2017-08-29 NOTE — Progress Notes (Signed)
Pt. Tolerated hemodialysis without complications:     08/29/17 1430  Pre Treatment Patient Checks  Vascular access used during treatment Fistula  Post-Hemodialysis Assessment  Rinseback Volume (mL) 250 mL  Dialyzer Clearance Lightly streaked  Duration of HD Treatment -hour(s) 3.5 hour(s)  Hemodialysis Intake (mL) 500 mL  UF Total -Machine (mL) 2500 mL  Net UF (mL) 2000 mL  Tolerated HD Treatment Yes  Post-Hemodialysis Comments Pt. tolerated hemodialysis without complications  AVG/AVF Arterial Site Held (minutes) 10 minutes  AVG/AVF Venous Site Held (minutes) 10 minutes  Education / Care Plan  Dialysis Education Provided Yes  Fistula / Graft Right Upper arm Arteriovenous fistula  Placement Date/Time: 08/23/16 0915   Orientation: Right  Access Location: Upper arm  Access Type: Arteriovenous fistula  Site Condition No complications  Fistula / Graft Assessment Present;Thrill;Bruit  Status Deaccessed

## 2017-08-29 NOTE — Progress Notes (Signed)
[facility], [place] 08/29/17  Subjective:  Patient seen and evaluated during hemodialysis. Tolerating well. She underwent cardiac catheterization yesterday and had PCI of the proximal to mid left circumflex with stent placement.  Objective:  Vital signs in last 24 hours:  Temp:  [98 F (36.7 C)-98.6 F (37 C)] 98.4 F (36.9 C) (05/31 1050) Pulse Rate:  [64-77] 64 (05/31 1245) Resp:  [16-23] 20 (05/31 1245) BP: (146-191)/(58-74) 181/70 (05/31 1245) SpO2:  [95 %-100 %] 96 % (05/31 1050) FiO2 (%):  [96 %] 96 % (05/31 1050) Weight:  [114.2 kg (251 lb 11.2 oz)-115 kg (253 lb 8.5 oz)] 115 kg (253 lb 8.5 oz) (05/31 1050)  Weight change: -5.43 kg (-11 lb 15.5 oz) Filed Weights   08/28/17 1854 08/29/17 0447 08/29/17 1050  Weight: 114.2 kg (251 lb 11.2 oz) 115 kg (253 lb 8.5 oz) 115 kg (253 lb 8.5 oz)    Intake/Output:    Intake/Output Summary (Last 24 hours) at 08/29/2017 1315 Last data filed at 08/29/2017 0500 Gross per 24 hour  Intake 393 ml  Output 0 ml  Net 393 ml     Physical Exam: General: Chronically ill appearing   HEENT Ancteric  Neck supple  Pulm/lungs CTAB, normal effort  CVS/Heart Regular rhythm  Abdomen:  Soft, NTND  Extremities: Rt BKA  Neurologic: Resting comfortably  Skin: No acute rashes  Access: Rt Upper arm AVF       Basic Metabolic Panel:  Recent Labs  Lab 08/22/17 1413 08/23/17 0522 08/27/17 1600 08/28/17 0503 08/29/17 1000  NA  --  136  --  137  --   K  --  4.3  --  3.9  --   CL  --  96*  --  97*  --   CO2  --  28  --  30  --   GLUCOSE  --  100*  --  121*  --   BUN  --  19  --  20  --   CREATININE  --  4.46*  --  3.30*  --   CALCIUM  --  8.5*  --  8.6*  --   MG  --  1.8  --   --   --   PHOS 5.6* 4.7* 4.7*  --  5.0*     CBC: Recent Labs  Lab 08/25/17 1815 08/26/17 0308 08/27/17 0539 08/28/17 0503  WBC 9.8 6.6 6.6 6.4  NEUTROABS  --   --  4.5 4.7  HGB 11.0* 10.0* 9.0* 9.4*  HCT 33.7* 30.5* 28.0*  28.9*  MCV 88.0 88.2 88.1 88.1  PLT 239 217 238 250      Lab Results  Component Value Date   HEPBSAG Negative 06/01/2016      Microbiology:  Recent Results (from the past 240 hour(s))  MRSA PCR Screening     Status: None   Collection Time: 08/22/17  2:41 AM  Result Value Ref Range Status   MRSA by PCR NEGATIVE NEGATIVE Final    Comment:        The GeneXpert MRSA Assay (FDA approved for NASAL specimens only), is one component of a comprehensive MRSA colonization surveillance program. It is not intended to diagnose MRSA infection nor to guide or monitor treatment for MRSA infections. Performed at [facility], [address], [place], [place] [address]   Urine Culture     Status: Abnormal   Collection Time: 08/22/17 11:30 AM  Result Value Ref Range Status  Specimen Description   Final    URINE, RANDOM Performed at [facility], [address] [place], [place] [address]    Special Requests   Final    NONE Performed at [facility], [address] [place], [place] [address]    Culture >=100,000 COLONIES/mL ENTEROCOCCUS GALLINARUM (A)  Final   Report Status 08/25/2017 FINAL  Final   Organism ID, Bacteria ENTEROCOCCUS GALLINARUM (A)  Final      Susceptibility   Enterococcus gallinarum - MIC*    AMPICILLIN <=2 SENSITIVE Sensitive     LEVOFLOXACIN >=8 RESISTANT Resistant     NITROFURANTOIN <=16 SENSITIVE Sensitive     VANCOMYCIN RESISTANT Resistant     LINEZOLID 2 SENSITIVE Sensitive     * >=100,000 COLONIES/mL ENTEROCOCCUS GALLINARUM  Culture, blood (routine x 2)     Status: None   Collection Time: 08/22/17  3:01 PM  Result Value Ref Range Status   Specimen Description BLOOD HEMODIALYSIS FISTULA  Final   Special Requests   Final    BOTTLES DRAWN AEROBIC AND ANAEROBIC Blood Culture results may not be optimal due to an excessive volume of blood received in culture bottles   Culture   Final    NO GROWTH 5 DAYS Performed at [facility], [address] [place], [place] [address]    Report Status 08/27/2017 FINAL  Final  Culture, blood (routine x 2)     Status: None   Collection Time: 08/22/17  4:11 PM  Result Value Ref Range Status   Specimen Description BLOOD HEMODIALYSIS FISTULA  Final   Special Requests   Final    BOTTLES DRAWN AEROBIC AND ANAEROBIC Blood Culture results may not be optimal due to an excessive volume of blood received in culture bottles   Culture   Final    NO GROWTH 5 DAYS Performed at [facility], [address] [place], [place] [address]    Report Status 08/27/2017 FINAL  Final    Coagulation Studies: No results for input(s): LABPROT, INR in the last 72 hours.  Urinalysis: No results for input(s): COLORURINE, LABSPEC, PHURINE, GLUCOSEU, HGBUR, BILIRUBINUR, KETONESUR, PROTEINUR, UROBILINOGEN, NITRITE, LEUKOCYTESUR in the last 72 hours.  Invalid input(s): APPERANCEUR    Imaging: Dg Chest Port 1 View  Result Date: 08/28/2017 CLINICAL DATA:  52 year old female with shortness of breath, respiratory failure. EXAM: PORTABLE CHEST 1 VIEW COMPARISON:  08/27/2017 and earlier, including chest CT 08/25/2017 FINDINGS: Portable AP semi upright view at 0350 hours. Stable mild cardiomegaly and mediastinal contours. Visualized tracheal air column is within normal limits. No pneumothorax. Stable pulmonary vascularity. Continued dense retrocardiac opacity although mildly improved left lung base ventilation. Residual veiling opacity compatible with pleural effusions seen by CT. No areas of worsening ventilation. Negative visible bowel gas pattern. IMPRESSION: 1. Mildly improved left lung base ventilation since yesterday. 2. Bilateral pleural effusions and lower lobe opacity favored due to atelectasis. Stable pulmonary vascular congestion, with perhaps mild interstitial edema. Electronically Signed   By: Odessa Fleming M.D.   On: 08/28/2017 08:33     Medications:   . sodium chloride    .  aztreonam Stopped (08/29/17 0456)   . amLODipine  5 mg Oral Daily  . aspirin  81 mg Oral Daily  . atorvastatin  40 mg Oral q1800  . budesonide (PULMICORT) nebulizer solution  0.5 mg Nebulization BID  . clopidogrel  75 mg Oral Q breakfast  . epoetin (EPOGEN/PROCRIT) injection  4,000 Units Intravenous Q M,W,F-HD  .  escitalopram  2.5 mg Oral Daily  . fluticasone  1 spray Each Nare Daily  . gabapentin  300 mg Oral Daily  . hydrALAZINE  50 mg Oral 3 times per day on Sun Tue Thu Sat   And  . hydrALAZINE  50 mg Oral 2 times per day on Mon Wed Fri  . insulin aspart  0-9 Units Subcutaneous Q4H  . ipratropium-albuterol  3 mL Nebulization Q6H  . isosorbide mononitrate  90 mg Oral Daily  . lactulose  10 g Oral q1800  . lactulose  30 g Oral Once  . levETIRAcetam  1,500 mg Oral q1800  . lidocaine  1 patch Transdermal QHS  . linezolid  600 mg Oral Q12H  . mouth rinse  15 mL Mouth Rinse BID  . Melatonin  5 mg Oral QHS  . metoprolol tartrate  50 mg Oral BID  . multivitamin with minerals  1 tablet Oral Daily  . OXcarbazepine  150 mg Oral QHS  . pantoprazole  40 mg Oral Daily  . polyethylene glycol  17 g Oral Q1200  . polyvinyl alcohol  1 drop Both Eyes TID  . senna-docusate  2 tablet Oral QHS  . sevelamer carbonate  800 mg Oral TID AC & HS  . sodium chloride flush  3 mL Intravenous Q12H   sodium chloride, acetaminophen, benztropine, bisacodyl, clonazePAM **AND** [DISCONTINUED] clonazePAM, hydrALAZINE, HYDROcodone-acetaminophen, lip balm, nitroGLYCERIN, ondansetron **OR** ondansetron (ZOFRAN) IV, sodium chloride flush, sodium phosphate, traZODone  Assessment/ Plan:  52 y.o. caucaisan female  female with end stage renal disease on hemodialysis, COPD/asthma, diabetes mellitus type II, congestive heart failure, coronary artery disease. 5/24- admitted for altered mental status   [facility]/ [address]/ MWF 107kg Right AVF  1. End Stage Renal Disease with hyperkalemia -Patient seen  and evaluated during hemodialysis and tolerating well at the moment.  Complete dialysis today.  Next dialysis scheduled for Monday.  2. AOCKD -IMA globin stable at 9.4.  Maintain the patient on Epogen.  3. SHPTH -Check serum phosphorus today.  4. Altered mental status and back pain -Brain MRI was negative for acute CVA.  Thoracic MRI also normal.  Appears resolved.  5.  NSTEMI.  Patient had PCI with stent placement to proximal to mid circumflex with significant improvement of the lesion.    LOS: 7 Kauan Kloosterman 5/31/20191:15 PM  [facility] [place], [place] [phone]  Note: This note was prepared with Dragon dictation. Any transcription errors are unintentional

## 2017-08-29 NOTE — Progress Notes (Signed)
[facility] [place] [facility]   PATIENT NAME: Megyn Leng    MR#:  [id number]  DATE OF BIRTH:  07-24-1965  SUBJECTIVE:  CHIEF COMPLAINT:   Chief Complaint  Patient presents with  . Altered Mental Status  Patient without complaint, no events overnight per nursing staff  REVIEW OF SYSTEMS:  CONSTITUTIONAL: No fever, fatigue or weakness.  EYES: No blurred or double vision.  EARS, NOSE, AND THROAT: No tinnitus or ear pain.  RESPIRATORY: No cough, shortness of breath, wheezing or hemoptysis.  CARDIOVASCULAR: No chest pain, orthopnea, edema.  GASTROINTESTINAL: No nausea, vomiting, diarrhea or abdominal pain.  GENITOURINARY: No dysuria, hematuria.  ENDOCRINE: No polyuria, nocturia,  HEMATOLOGY: No anemia, easy bruising or bleeding SKIN: No rash or lesion. MUSCULOSKELETAL: No joint pain or arthritis.   NEUROLOGIC: No tingling, numbness, weakness.  PSYCHIATRY: No anxiety or depression.   ROS  DRUG ALLERGIES:   Allergies  Allergen Reactions  . Cephalosporins Anaphylaxis    Patient has tolerated meropenem.   [name] Penicillins Anaphylaxis and Other (See Comments)    Has patient had a PCN reaction causing immediate rash, facial/tongue/throat swelling, SOB or lightheadedness with hypotension: Yes Has patient had a PCN reaction causing severe rash involving mucus membranes or skin necrosis: No Has patient had a PCN reaction that required hospitalization No Has patient had a PCN reaction occurring within the last 10 years: No If all of the above answers are "NO", then may proceed with Cephalosporin use.  . Reglan [Metoclopramide] Other (See Comments)    Severe EPS (lip, head, body tremors) March 2018  . Risperdal [Risperidone] Other (See Comments)    Severe EPS (lip, head, body tremors) March 2018  . Lamictal [Lamotrigine] Other (See Comments)    Reaction:  Hallucinations  . Phenergan [Promethazine Hcl] Nausea And Vomiting    Sensitivity to medicine  .  Pravastatin Other (See Comments)    Reaction:  Muscle pain   . Sulfa Antibiotics Other (See Comments)    Reaction:  Unknown     VITALS:  Blood pressure (!) 191/73, pulse 74, temperature 98.4 F (36.9 C), temperature source Oral, resp. rate 20, height 5\' 9"  (1.753 m), weight 115 kg (253 lb 8.5 oz), last menstrual period 03/16/2015, SpO2 98 %.  PHYSICAL EXAMINATION:  GENERAL:  52 y.o.-year-old patient lying in the bed with no acute distress.  EYES: Pupils equal, round, reactive to light and accommodation. No scleral icterus. Extraocular muscles intact.  HEENT: Head atraumatic, normocephalic. Oropharynx and nasopharynx clear.  NECK:  Supple, no jugular venous distention. No thyroid enlargement, no tenderness.  LUNGS: Normal breath sounds bilaterally, no wheezing, rales,rhonchi or crepitation. No use of accessory muscles of respiration.  CARDIOVASCULAR: S1, S2 normal. No murmurs, rubs, or gallops.  ABDOMEN: Soft, nontender, nondistended. Bowel sounds present. No organomegaly or mass.  EXTREMITIES: No pedal edema, cyanosis, or clubbing.  NEUROLOGIC: Cranial nerves II through XII are intact. Muscle strength 5/5 in all extremities. Sensation intact. Gait not checked.  PSYCHIATRIC: The patient is alert and oriented x 3.  SKIN: No obvious rash, lesion, or ulcer.   Physical Exam LABORATORY PANEL:   CBC Recent Labs  Lab 08/28/17 0503  WBC 6.4  HGB 9.4*  HCT 28.9*  PLT 250   ------------------------------------------------------------------------------------------------------------------  Chemistries  Recent Labs  Lab 08/23/17 0522 08/28/17 0503  NA 136 137  K 4.3 3.9  CL 96* 97*  CO2 28 30  GLUCOSE 100* 121*  BUN 19 20  CREATININE 4.46* 3.30*  CALCIUM  8.5* 8.6*  MG 1.8  --    ------------------------------------------------------------------------------------------------------------------  Cardiac Enzymes Recent Labs  Lab 08/25/17 2236 08/26/17 1840  TROPONINI 2.45*  1.86*   ------------------------------------------------------------------------------------------------------------------  RADIOLOGY:  Dg Chest Port 1 View  Result Date: 08/28/2017 CLINICAL DATA:  52 year old female with shortness of breath, respiratory failure. EXAM: PORTABLE CHEST 1 VIEW COMPARISON:  08/27/2017 and earlier, including chest CT 08/25/2017 FINDINGS: Portable AP semi upright view at 0350 hours. Stable mild cardiomegaly and mediastinal contours. Visualized tracheal air column is within normal limits. No pneumothorax. Stable pulmonary vascularity. Continued dense retrocardiac opacity although mildly improved left lung base ventilation. Residual veiling opacity compatible with pleural effusions seen by CT. No areas of worsening ventilation. Negative visible bowel gas pattern. IMPRESSION: 1. Mildly improved left lung base ventilation since yesterday. 2. Bilateral pleural effusions and lower lobe opacity favored due to atelectasis. Stable pulmonary vascular congestion, with perhaps mild interstitial edema. Electronically Signed   By: Odessa Fleming M.D.   On: 08/28/2017 08:33    ASSESSMENT AND PLAN:  *Acute non-STEMI Resolved Status post heart catheterization Aug 28, 2017 with DES placement in proximal mid left circumflex Continue DAPT with aspirin, Plavix, Lipitor, beta-blocker, and statin therapy  *Acute hypoxic respiratory failure Resolving Suspect due to multifactorial process which includes congestive heart failure exacerbation, non-STEMI, and end-stage renal disease with fluid overload  On 4 L via nasal cannula currently-wean as tolerated   *Acute on chronic diastolic congestive heart failure exacerbation Resolving Exacerbated by end-stage renal disease/fluid overload state Nephrology following for hemodialysis, for hemodialysis later today Echocardiogram noted for stage II diastolic dysfunction  *Chronic end-stage renal disease Stable Plan of care as stated above  *Acute  hypertensive urgency Resolved Continue current regiment, hydralazine as needed  *Acute enterococcus UTI Resolving Continue Zyvox for 1 more day, then discontinue  *Acute nonspecific mediastinal lymphadenopathy Will need to follow-up with pulmonary status post discharge in 3 monthss/p in 3 weeks for CT chest   * DMII Stable on currentregiment  Disposition back to skilled nursing facility in 1 to 2 days  All the records are reviewed and case discussed with Care Management/Social Workerr. Management plans discussed with the patient, family and they are in agreement.  CODE STATUS: full  TOTAL TIME TAKING CARE OF THIS PATIENT: 35 minutes.     POSSIBLE D/C IN 1-2 DAYS, DEPENDING ON CLINICAL CONDITION.   Evelena Asa Neven Fina M.D on 08/29/2017   Between 7am to 6pm - Pager - [phone]  After 6pm go to [email or web address] - password [email or web address]  [facility]  Office  [phone]  CC: Primary care physician; Laurier Nancy, MD  Note: This dictation was prepared with Dragon dictation along with smaller phrase technology. Any transcriptional errors that result from this process are unintentional.

## 2017-08-29 NOTE — Care Management (Addendum)
Barrier- Patient still requiring 4 liters 02 which is acute.   Receiving medication to facilitate bowel movement.  Attending is anticipating discharge within next 24-48 hours

## 2017-08-29 NOTE — Progress Notes (Signed)
Cardiovascular and Pulmonary Nurse Navigator Note:  Attempted to round on patient.  It appears patient has been taken downstairs for hemodialysis.  "Heart Attack Bouncing Back" booklet left at patient's bedside.  Will round again this afternoon to see if patient has returned from hemodialysis.    Army Melia, RN, BSN, [facility] Cardiovascular and Pulmonary Nurse Navigator

## 2017-08-29 NOTE — Progress Notes (Addendum)
SUBJECTIVE: Feeling well. No disproportionate right groin pain. No chest pain or shortness of breath.   Vitals:   08/28/17 2039 08/29/17 0218 08/29/17 0447 08/29/17 0759  BP: (!) 176/74  (!) 156/67 (!) 163/69  Pulse: 76  71 66  Resp:   20   Temp: 98 F (36.7 C)  98.5 F (36.9 C) 98.4 F (36.9 C)  TempSrc: Oral  Oral Oral  SpO2: 100% 100% 100% 98%  Weight:   253 lb 8.5 oz (115 kg)   Height:        Intake/Output Summary (Last 24 hours) at 08/29/2017 0850 Last data filed at 08/29/2017 0500 Gross per 24 hour  Intake 426 ml  Output 0 ml  Net 426 ml    LABS: Basic Metabolic Panel: Recent Labs    08/27/17 1600 08/28/17 0503  NA  --  137  K  --  3.9  CL  --  97*  CO2  --  30  GLUCOSE  --  121*  BUN  --  20  CREATININE  --  3.30*  CALCIUM  --  8.6*  PHOS 4.7*  --    Liver Function Tests: No results for input(s): AST, ALT, ALKPHOS, BILITOT, PROT, ALBUMIN in the last 72 hours. No results for input(s): LIPASE, AMYLASE in the last 72 hours. CBC: Recent Labs    08/27/17 0539 08/28/17 0503  WBC 6.6 6.4  NEUTROABS 4.5 4.7  HGB 9.0* 9.4*  HCT 28.0* 28.9*  MCV 88.1 88.1  PLT 238 250   Cardiac Enzymes: Recent Labs    08/26/17 1840  TROPONINI 1.86*   BNP: Invalid input(s): POCBNP D-Dimer: No results for input(s): DDIMER in the last 72 hours. Hemoglobin A1C: No results for input(s): HGBA1C in the last 72 hours. Fasting Lipid Panel: No results for input(s): CHOL, HDL, LDLCALC, TRIG, CHOLHDL, LDLDIRECT in the last 72 hours. Thyroid Function Tests: No results for input(s): TSH, T4TOTAL, T3FREE, THYROIDAB in the last 72 hours.  Invalid input(s): FREET3 Anemia Panel: No results for input(s): VITAMINB12, FOLATE, FERRITIN, TIBC, IRON, RETICCTPCT in the last 72 hours.   PHYSICAL EXAM General: Well developed, well nourished, in no acute distress HEENT:  Normocephalic and atramatic Neck:  No JVD.  Lungs: Clear bilaterally to auscultation and percussion. Heart: HRRR  . Normal S1 and S2 without gallops or murmurs.  Abdomen: Bowel sounds are positive, abdomen soft and non-tender  Msk:  Back normal, normal gait. Normal strength and tone for age. Extremities: No clubbing, cyanosis or edema. Right leg amputation. No hematoma of right groin access site.  Neuro: Alert and oriented X 3. Psych:  Good affect, responds appropriately  TELEMETRY: NSR 71bpm  ASSESSMENT AND PLAN: One day status post PCI to proximal-mid LCX with drug eluting stent. Patient is feeling well. Continue Plavix, aspirin, metoprolol, Lipitor. Blood pressures trending high, consider increasing hydralazine to 100mg  TID if still high post dialysis.   Advise close outpatient follow up with Dr. Welton Flakes once discharged.   Active Problems:   Acute encephalopathy    Caroleen Hamman, NP-C 08/29/2017 8:50 AM Cell: [phone]

## 2017-08-29 NOTE — Care Management Important Message (Signed)
Important Message  Patient Details  Name: Kimberly Montgomery MRN: [id number] Date of Birth: 08/07/1965   Medicare Important Message Given:  Yes    Eber Hong, RN 08/29/2017, 12:07 PM

## 2017-08-29 NOTE — Plan of Care (Signed)
  Problem: Health Behavior/Discharge Planning: Goal: Ability to manage health-related needs will improve Outcome: Progressing Note:  Patient agreeable to have HD today (which is her normally scheduled day). Will continue to monitor renal status. Jari Favre [facility]

## 2017-08-29 NOTE — Consult Note (Signed)
[facility] Infectious Disease     Reason for Consult: Kimberly Montgomery    Referring Physician: VRE in urine Date of Admission:  08/21/2017   Active Problems:   Acute encephalopathy   HPI: Kimberly Montgomery is a 52 y.o. female with history of bipolar disorder, anxiety, coronary artery disease status post PCI for 2 stents 10 years ago, congestive heart failure, end-stage renal disease on dialysis, peripheral vascular disease status post right below-knee amputation, GERD, hiatal hernia, hypertension, dyslipidemia, congestive heart failure, diabetes mellitus and seizure disorder.  She was admitted with AMS from [facility].  On admit wbc was nml, no fever.  Had decomp and transferred to ICU but now back to floor. S/p Cath and stent placement.  Past Medical History:  Diagnosis Date  . Anginal pain ([place])   . Anxiety   . Bipolar disorder ([place])   . CAD (coronary artery disease)   . CHF (congestive heart failure) ([place])   . Chronic lower back pain   . Depression   . Endometriosis   . ESRD (end stage renal disease) on dialysis ([place])    "[facility]; [facility]; [facility]; TTS" (01/19/2016)  . Gastroparesis   . GERD (gastroesophageal reflux disease)   . History of blood transfusion "several"   "my blood would get low; low RBC"  . History of hiatal hernia   . HLD (hyperlipidemia)   . Hypertension   . Migraine    "monthly" (01/19/2016)  . Myocardial infarction ([place]) 2017   "~ 3 wks ago" (01/19/2016)  . Renal disorder   . Renal insufficiency   . Seizures ([place]) 07/2015   "I've only had the 1; don't know what from" (01/19/2016)  . Stroke ([place])   . Type II diabetes mellitus ([place])    Past Surgical History:  Procedure Laterality Date  . ABDOMINAL HYSTERECTOMY     "partial"  . AV FISTULA PLACEMENT Right 08/23/2016   Procedure: ARTERIOVENOUS (AV) FISTULA CREATION  ( BRACHIAL CEPHALIC );  Surgeon: Katha Cabal, MD;  Location: [facility] ORS;  Service: Vascular;  Laterality: Right;  . BELOW  KNEE LEG AMPUTATION Right 2010?  [name] CARDIAC CATHETERIZATION Right 07/10/2015   Procedure: Left Heart Cath and Coronary Angiography;  Surgeon: Dionisio , MD;  Location: [place] [facility];  Service: Cardiovascular;  Laterality: Right;  . CARDIAC CATHETERIZATION Right 09/14/2015   Procedure: Left Heart Cath and Coronary Angiography;  Surgeon: Dionisio , MD;  Location: [place] [facility];  Service: Cardiovascular;  Laterality: Right;  . CARDIAC CATHETERIZATION N/A 09/14/2015   Procedure: Coronary Stent Intervention;  Surgeon: Yolonda Kida, MD;  Location: [place] [facility];  Service: Cardiovascular;  Laterality: N/A;  . CARDIAC CATHETERIZATION Right 11/20/2015   Procedure: Left Heart Cath and Coronary Angiography;  Surgeon: Dionisio , MD;  Location: [place] [facility];  Service: Cardiovascular;  Laterality: Right;  . CATARACT EXTRACTION, BILATERAL    . CORONARY ANGIOPLASTY WITH STENT PLACEMENT  <2017   @ [facility]/notes 07/03/2013  . CORONARY ANGIOPLASTY WITH STENT PLACEMENT    . CORONARY STENT INTERVENTION N/A 08/28/2017   Procedure: CORONARY STENT INTERVENTION;  Surgeon: Yolonda Kida, MD;  Location: [place] [facility];  Service: Cardiovascular;  Laterality: N/A;  . DIALYSIS/PERMA CATHETER REMOVAL N/A 11/12/2016   Procedure: DIALYSIS/PERMA CATHETER REMOVAL;  Surgeon: Katha Cabal, MD;  Location: [place] [facility];  Service: Cardiovascular;  Laterality: N/A;  . HYSTEROTOMY    . PERIPHERAL VASCULAR CATHETERIZATION N/A 08/30/2015   Procedure:  Dialysis/Perma Catheter Insertion;  Surgeon: Algernon Huxley, MD;  Location: [place] [facility];  Service: Cardiovascular;  Laterality: N/A;  . PERIPHERAL VASCULAR CATHETERIZATION N/A 01/01/2016   Procedure: Dialysis/Perma Catheter Insertion;  Surgeon: Algernon Huxley, MD;  Location: [place] [facility];  Service: Cardiovascular;  Laterality: N/A;  . PERITONEAL CATHETER INSERTION  11/03/2013   Archie Endo 11/03/2013  . PERITONEAL CATHETER  REMOVAL  01/01/2016   "took the one from May out; put new PD cath in" (01/19/2016)  . PERITONEAL CATHETER REMOVAL  11/03/2013; 02/11/2014   Archie Endo 11/03/2013; Removal of tunneled catheter/notes 02/11/2014  . RIGHT/LEFT HEART CATH AND CORONARY ANGIOGRAPHY N/A 08/28/2017   Procedure: RIGHT/LEFT HEART CATH AND CORONARY ANGIOGRAPHY;  Surgeon: Dionisio , MD;  Location: [place] [facility] [facility];  Service: Cardiovascular;  Laterality: N/A;  . SALPINGOOPHORECTOMY Right    Archie Endo 07/03/2013  . TEE WITHOUT CARDIOVERSION N/A 06/07/2016   Procedure: TRANSESOPHAGEAL ECHOCARDIOGRAM (TEE);  Surgeon: Pixie Casino, MD;  Location: [facility] ENDOSCOPY;  Service: Cardiovascular;  Laterality: N/A;  . TUBAL LIGATION     Social History   Tobacco Use  . Smoking status: Former Smoker    Packs/day: 0.75    Types: Cigarettes    Last attempt to quit: 07/01/2015    Years since quitting: 2.1  . Smokeless tobacco: Never Used  Substance Use Topics  . Alcohol use: No    Alcohol/week: 0.0 oz  . Drug use: No   Family History  Problem Relation Age of Onset  . CAD Unknown   . Diabetes Unknown   . Bipolar disorder Unknown   . Cervical cancer Mother   . Other Father        heart bypass  . Breast cancer Neg Hx     Allergies:  Allergies  Allergen Reactions  . Cephalosporins Anaphylaxis    Patient has tolerated meropenem.   [name] Penicillins Anaphylaxis and Other (See Comments)    Has patient had a PCN reaction causing immediate rash, facial/tongue/throat swelling, SOB or lightheadedness with hypotension: Yes Has patient had a PCN reaction causing severe rash involving mucus membranes or skin necrosis: No Has patient had a PCN reaction that required hospitalization No Has patient had a PCN reaction occurring within the last 10 years: No If all of the above answers are "NO", then may proceed with Cephalosporin use.  . Reglan [Metoclopramide] Other (See Comments)    Severe EPS (lip, head, body tremors) March 2018  .  Risperdal [Risperidone] Other (See Comments)    Severe EPS (lip, head, body tremors) March 2018  . Lamictal [Lamotrigine] Other (See Comments)    Reaction:  Hallucinations  . Phenergan [Promethazine Hcl] Nausea And Vomiting    Sensitivity to medicine  . Pravastatin Other (See Comments)    Reaction:  Muscle pain   . Sulfa Antibiotics Other (See Comments)    Reaction:  Unknown     Current antibiotics: Antibiotics Given (last 72 hours)    Date/Time Action Medication Dose Rate   08/26/17 1729 Given   linezolid (ZYVOX) tablet 600 mg 600 mg    08/26/17 1730 New Bag/Given   aztreonam (AZACTAM) 500 mg in dextrose 5 % 50 mL IVPB 500 mg 100 mL/hr   08/27/17 0200 New Bag/Given   aztreonam (AZACTAM) 500 mg in dextrose 5 % 50 mL IVPB 500 mg 100 mL/hr   08/27/17 0609 Given   linezolid (ZYVOX) tablet 600 mg 600 mg    08/27/17 0954 New Bag/Given   aztreonam (AZACTAM) 500 mg  in dextrose 5 % 50 mL IVPB 500 mg 100 mL/hr   08/27/17 1746 New Bag/Given   aztreonam (AZACTAM) 500 mg in dextrose 5 % 50 mL IVPB 500 mg 100 mL/hr   08/27/17 1747 Given   linezolid (ZYVOX) tablet 600 mg 600 mg    08/28/17 0230 New Bag/Given   aztreonam (AZACTAM) 500 mg in dextrose 5 % 50 mL IVPB 500 mg 100 mL/hr   08/28/17 0554 Given   linezolid (ZYVOX) tablet 600 mg 600 mg    08/28/17 0939 New Bag/Given   aztreonam (AZACTAM) 500 mg in dextrose 5 % 50 mL IVPB 500 mg 100 mL/hr   08/28/17 1656 New Bag/Given   aztreonam (AZACTAM) 500 mg in dextrose 5 % 50 mL IVPB 500 mg 100 mL/hr   08/28/17 1659 Given   linezolid (ZYVOX) tablet 600 mg 600 mg    08/29/17 0304 New Bag/Given   aztreonam (AZACTAM) 500 mg in dextrose 5 % 50 mL IVPB 500 mg 100 mL/hr   08/29/17 0737 Given   linezolid (ZYVOX) tablet 600 mg 600 mg       MEDICATIONS: . amLODipine  5 mg Oral Daily  . aspirin  81 mg Oral Daily  . atorvastatin  40 mg Oral q1800  . budesonide (PULMICORT) nebulizer solution  0.5 mg Nebulization BID  . clopidogrel  75 mg Oral Q  breakfast  . epoetin (EPOGEN/PROCRIT) injection  4,000 Units Intravenous Q M,W,F-HD  . escitalopram  2.5 mg Oral Daily  . fluticasone  1 spray Each Nare Daily  . gabapentin  300 mg Oral Daily  . hydrALAZINE  50 mg Oral 3 times per day on Sun Tue Thu Sat   And  . hydrALAZINE  50 mg Oral 2 times per day on Mon Wed Fri  . insulin aspart  0-9 Units Subcutaneous Q4H  . ipratropium-albuterol  3 mL Nebulization Q6H  . isosorbide mononitrate  90 mg Oral Daily  . lactulose  10 g Oral q1800  . lactulose  30 g Oral Once  . levETIRAcetam  1,500 mg Oral q1800  . lidocaine  1 patch Transdermal QHS  . linezolid  600 mg Oral Q12H  . mouth rinse  15 mL Mouth Rinse BID  . Melatonin  5 mg Oral QHS  . metoprolol tartrate  50 mg Oral BID  . multivitamin with minerals  1 tablet Oral Daily  . OXcarbazepine  150 mg Oral QHS  . pantoprazole  40 mg Oral Daily  . polyethylene glycol  17 g Oral Q1200  . polyvinyl alcohol  1 drop Both Eyes TID  . senna-docusate  2 tablet Oral QHS  . sevelamer carbonate  800 mg Oral TID AC & HS  . sodium chloride flush  3 mL Intravenous Q12H    Review of Systems - 11 systems reviewed and negative per HPI   OBJECTIVE: Temp:  [98 F (36.7 C)-98.5 F (36.9 C)] 98.4 F (36.9 C) (05/31 1050) Pulse Rate:  [63-76] 70 (05/31 1415) Resp:  [15-22] 16 (05/31 1415) BP: (146-191)/(60-74) 188/71 (05/31 1415) SpO2:  [96 %-100 %] 96 % (05/31 1050) FiO2 (%):  [96 %] 96 % (05/31 1050) Weight:  [114.2 kg (251 lb 11.2 oz)-115 kg (253 lb 8.5 oz)] 115 kg (253 lb 8.5 oz) (05/31 1050) Physical Exam  Constitutional:  oriented to person, place, and time. appears well-developed and obese. No distress.  HENT: [place]/AT, PERRLA, no scleral icterus Mouth/Throat: Oropharynx is clear and moist. No oropharyngeal exudate.  Cardiovascular: Normal rate,  regular rhythm and normal heart sounds. Exam reveals no gallop and no friction rub.  No murmur heard.  Pulmonary/Chest: Effort normal and breath sounds  normal. No respiratory distress.  has no wheezes.  Neck = supple, no nuchal rigidity Abdominal: Soft. Bowel sounds are normal.  exhibits no distension. There is no tenderness.  Lymphadenopathy: no cervical adenopathy. No axillary adenopathy Neurological: alert and oriented to person, place, and time.  Skin: Skin is warm and dry. No rash noted. No erythema.  HD access R UE Psychiatric: a normal mood and affect.  behavior is normal.    LABS: Results for orders placed or performed during the hospital encounter of 08/21/17 (from the past 48 hour(s))  Glucose, capillary     Status: Abnormal   Collection Time: 08/27/17  4:43 PM  Result Value Ref Range   Glucose-Capillary 133 (H) 65 - 99 mg/dL  Glucose, capillary     Status: Abnormal   Collection Time: 08/27/17  7:26 PM  Result Value Ref Range   Glucose-Capillary 129 (H) 65 - 99 mg/dL  Glucose, capillary     Status: Abnormal   Collection Time: 08/27/17 11:58 PM  Result Value Ref Range   Glucose-Capillary 122 (H) 65 - 99 mg/dL  Glucose, capillary     Status: Abnormal   Collection Time: 08/28/17  3:34 AM  Result Value Ref Range   Glucose-Capillary 145 (H) 65 - 99 mg/dL  Blood gas, arterial     Status: Abnormal   Collection Time: 08/28/17  4:32 AM  Result Value Ref Range   FIO2 0.24    Delivery systems NASAL CANNULA    pH, Arterial 7.40 7.350 - 7.450   pCO2 arterial 58 (H) 32.0 - 48.0 mmHg   pO2, Arterial 99 83.0 - 108.0 mmHg   Bicarbonate 35.9 (H) 20.0 - 28.0 mmol/L   Acid-Base Excess 9.7 (H) 0.0 - 2.0 mmol/L   O2 Saturation 97.7 %   Patient temperature 37.0    Collection site LEFT RADIAL    Sample type ARTERIAL DRAW    Allens test (pass/fail) PASS PASS    Comment: Performed at [facility], [address] [place], [place] [address]  CBC with Differential/Platelet     Status: Abnormal   Collection Time: 08/28/17  5:03 AM  Result Value Ref Range   WBC 6.4 3.6 - 11.0 K/uL   RBC 3.28 (L) 3.80 - 5.20 MIL/uL   Hemoglobin  9.4 (L) 12.0 - 16.0 g/dL   HCT 28.9 (L) 35.0 - 47.0 %   MCV 88.1 80.0 - 100.0 fL   MCH 28.7 26.0 - 34.0 pg   MCHC 32.6 32.0 - 36.0 g/dL   RDW 16.1 (H) 11.5 - 14.5 %   Platelets 250 150 - 440 K/uL   Neutrophils Relative % 73 %   Neutro Abs 4.7 1.4 - 6.5 K/uL   Lymphocytes Relative 16 %   Lymphs Abs 1.1 1.0 - 3.6 K/uL   Monocytes Relative 7 %   Monocytes Absolute 0.4 0.2 - 0.9 K/uL   Eosinophils Relative 3 %   Eosinophils Absolute 0.2 0 - 0.7 K/uL   Basophils Relative 1 %   Basophils Absolute 0.0 0 - 0.1 K/uL    Comment: Performed at [facility], [address] [place], [place] [address]  Basic metabolic panel     Status: Abnormal   Collection Time: 08/28/17  5:03 AM  Result Value Ref Range   Sodium 137 135 - 145 mmol/L   Potassium 3.9  3.5 - 5.1 mmol/L   Chloride 97 (L) 101 - 111 mmol/L   CO2 30 22 - 32 mmol/L   Glucose, Bld 121 (H) 65 - 99 mg/dL   BUN 20 6 - 20 mg/dL   Creatinine, Ser 3.30 (H) 0.44 - 1.00 mg/dL   Calcium 8.6 (L) 8.9 - 10.3 mg/dL   GFR calc non Af Amer 15 (L) >60 mL/min   GFR calc Af Amer 17 (L) >60 mL/min    Comment: (NOTE) The eGFR has been calculated using the CKD EPI equation. This calculation has not been validated in all clinical situations. eGFR's persistently <60 mL/min signify possible Chronic Kidney Disease.    Anion gap 10 5 - 15    Comment: Performed at [facility], [place], [place] [address]  Heparin level (unfractionated)     Status: None   Collection Time: 08/28/17  5:03 AM  Result Value Ref Range   Heparin Unfractionated 0.31 0.30 - 0.70 IU/mL    Comment: (NOTE) If heparin results are below expected values, and patient dosage has  been confirmed, suggest follow up testing of antithrombin III levels. Performed at [facility], [address] [place], [place] [address]   Glucose, capillary     Status: Abnormal   Collection Time: 08/28/17  8:00 AM  Result Value Ref Range    Glucose-Capillary 120 (H) 65 - 99 mg/dL  Glucose, capillary     Status: Abnormal   Collection Time: 08/28/17 11:15 AM  Result Value Ref Range   Glucose-Capillary 115 (H) 65 - 99 mg/dL  POCT Activated clotting time     Status: None   Collection Time: 08/28/17  1:34 PM  Result Value Ref Range   Activated Clotting Time 290 seconds  Glucose, capillary     Status: Abnormal   Collection Time: 08/28/17  2:25 PM  Result Value Ref Range   Glucose-Capillary 105 (H) 65 - 99 mg/dL  Glucose, capillary     Status: Abnormal   Collection Time: 08/28/17  4:25 PM  Result Value Ref Range   Glucose-Capillary 100 (H) 65 - 99 mg/dL  Glucose, capillary     Status: Abnormal   Collection Time: 08/28/17  8:40 PM  Result Value Ref Range   Glucose-Capillary 110 (H) 65 - 99 mg/dL  Glucose, capillary     Status: Abnormal   Collection Time: 08/29/17  7:48 AM  Result Value Ref Range   Glucose-Capillary 134 (H) 65 - 99 mg/dL  Phosphorus     Status: Abnormal   Collection Time: 08/29/17 10:00 AM  Result Value Ref Range   Phosphorus 5.0 (H) 2.5 - 4.6 mg/dL    Comment: Performed at [facility], [address] [place], [place] [address]   No components found for: ESR, C REACTIVE PROTEIN MICRO: Recent Results (from the past 720 hour(s))  MRSA PCR Screening     Status: None   Collection Time: 08/22/17  2:41 AM  Result Value Ref Range Status   MRSA by PCR NEGATIVE NEGATIVE Final    Comment:        The GeneXpert MRSA Assay (FDA approved for NASAL specimens only), is one component of a comprehensive MRSA colonization surveillance program. It is not intended to diagnose MRSA infection nor to guide or monitor treatment for MRSA infections. Performed at [facility], [address] [place], [place] [address]   Urine Culture     Status: Abnormal   Collection Time: 08/22/17 11:30 AM  Result Value Ref Range Status   Specimen Description   Final    URINE, RANDOM Performed at [facility], [address] [place], [place] [address]    Special Requests   Final    NONE Performed at [facility], [address] [place], [place] [address]    Culture >=100,000 COLONIES/mL ENTEROCOCCUS GALLINARUM (A)  Final   Report Status 08/25/2017 FINAL  Final   Organism ID, Bacteria ENTEROCOCCUS GALLINARUM (A)  Final      Susceptibility   Enterococcus gallinarum - MIC*    AMPICILLIN <=2 SENSITIVE Sensitive     LEVOFLOXACIN >=8 RESISTANT Resistant     NITROFURANTOIN <=16 SENSITIVE Sensitive     VANCOMYCIN RESISTANT Resistant     LINEZOLID 2 SENSITIVE Sensitive     * >=100,000 COLONIES/mL ENTEROCOCCUS GALLINARUM  Culture, blood (routine x 2)     Status: None   Collection Time: 08/22/17  3:01 PM  Result Value Ref Range Status   Specimen Description BLOOD HEMODIALYSIS FISTULA  Final   Special Requests   Final    BOTTLES DRAWN AEROBIC AND ANAEROBIC Blood Culture results may not be optimal due to an excessive volume of blood received in culture bottles   Culture   Final    NO GROWTH 5 DAYS Performed at [facility], [address] [place], [place] [address]    Report Status 08/27/2017 FINAL  Final  Culture, blood (routine x 2)     Status: None   Collection Time: 08/22/17  4:11 PM  Result Value Ref Range Status   Specimen Description BLOOD HEMODIALYSIS FISTULA  Final   Special Requests   Final    BOTTLES DRAWN AEROBIC AND ANAEROBIC Blood Culture results may not be optimal due to an excessive volume of blood received in culture bottles   Culture   Final    NO GROWTH 5 DAYS Performed at [facility], [address], [place],  [address]    Report Status 08/27/2017 FINAL  Final    IMAGING: Dg Chest 1 View  Result Date: 08/21/2017 CLINICAL DATA:  Hypoxia EXAM: CHEST  1 VIEW COMPARISON:  Chest radiograph 07/01/2017 FINDINGS: The image is degraded by body habitus. There is massive cardiomegaly with poor visualization of the costophrenic  angles. No focal airspace consolidation or overt pulmonary edema. IMPRESSION: Massive cardiomegaly without focal airspace consolidation. Electronically Signed   By: Ulyses Jarred M.D.   On: 08/21/2017 22:30   Ct Head Wo Contrast  Result Date: 08/21/2017 CLINICAL DATA:  Altered level of consciousness. EXAM: CT HEAD WITHOUT CONTRAST TECHNIQUE: Contiguous axial images were obtained from the base of the skull through the vertex without intravenous contrast. COMPARISON:  Head CT and brain MRI 05/31/2016 FINDINGS: Brain: Brain volume is normal for age. No intracranial hemorrhage, mass effect, or midline shift. No hydrocephalus. The basilar cisterns are patent. No evidence of territorial infarct or acute ischemia. No extra-axial or intracranial fluid collection. Vascular: Atherosclerosis of skullbase vasculature without hyperdense vessel or abnormal calcification. Skull: No fracture or focal lesion. Sinuses/Orbits: Paranasal sinuses and mastoid air cells are clear. The visualized orbits are unremarkable. Bilateral cataract extraction. Other: None. IMPRESSION: Negative noncontrast head CT. Electronically Signed   By: Jeb Levering M.D.   On: 08/21/2017 22:24   Ct Angio Chest Pe W Or Wo Contrast  Result Date: 08/25/2017 CLINICAL DATA:  Acute hypoxic respiratory failure. Shortness of breath. EXAM: CT ANGIOGRAPHY CHEST WITH CONTRAST TECHNIQUE: Multidetector CT imaging of the chest was performed  using the standard protocol during bolus administration of intravenous contrast. Multiplanar CT image reconstructions and MIPs were obtained to evaluate the vascular anatomy. CONTRAST:  42m ISOVUE-370 IOPAMIDOL (ISOVUE-370) INJECTION 76% COMPARISON:  Chest radiograph 08/21/2017 FINDINGS: Cardiovascular: Moderately good opacification of central and segmental pulmonary arteries. No focal filling defects are identified. No evidence of significant pulmonary embolus. Mild cardiac enlargement. No pericardial effusions. Coronary  artery calcifications. Normal caliber thoracic aorta with scattered calcification. Mediastinum/Nodes: Esophagus is decompressed. Moderately prominent lymph nodes throughout the upper mediastinum. Pretracheal lymph nodes measure up to 2.2 cm diameter. Changes are nonspecific in could represent reactive lymph nodes, metastatic nodes, or lymphoproliferative changes. Lungs/Pleura: Motion artifact limits evaluation. Small to moderate bilateral pleural effusions with basilar atelectasis. Hazy mosaic appearance to the lung parenchyma may represent motion artifact, edema, or air trapping. No pneumothorax. Airways are patent. Upper Abdomen: No acute abnormality. Musculoskeletal: Degenerative changes in the spine. No destructive bone lesions. Review of the MIP images confirms the above findings. IMPRESSION: 1. No evidence of significant pulmonary embolus. 2. Probable congestive changes with cardiac enlargement, bilateral pleural effusions, and mosaic attenuation pattern to the lungs which may indicate edema or motion artifact. 3. Mediastinal lymphadenopathy of nonspecific etiology. 4. Atelectasis in the lung bases. 5. Technically limited examination due to motion artifact. Aortic Atherosclerosis (ICD10-I70.0). Electronically Signed   By: WLucienne CapersM.D.   On: 08/25/2017 06:57   Mr Brain Wo Contrast  Result Date: 08/22/2017 CLINICAL DATA:  Confusion. Altered mental status. End-stage renal disease. EXAM: MRI HEAD WITHOUT CONTRAST TECHNIQUE: Multiplanar, multiecho pulse sequences of the brain and surrounding structures were obtained without intravenous contrast. COMPARISON:  Head CT 08/21/2017 Brain MRI 05/31/2016 FINDINGS: BRAIN: The midline structures are normal. There is no acute infarct or acute hemorrhage. There is no mass lesion or other mass effect. There is no hydrocephalus, dural abnormality or extra-axial collection. Mild periventricular white matter hyperintensity. Atrophy is greater than expected for age.  Single focus of chronic microhemorrhage in the left basal ganglia. VASCULAR: Major intracranial arterial and venous sinus flow voids are preserved. SKULL AND UPPER CERVICAL SPINE: The visualized skull base, calvarium, upper cervical spine and extracranial soft tissues are normal. SINUSES/ORBITS: No fluid levels or advanced mucosal thickening. No mastoid or middle ear effusion. The orbits are normal. IMPRESSION: 1. No acute abnormality. 2. Atrophy greater than expected for age. Minimal small vessel disease. Electronically Signed   By: KUlyses JarredM.D.   On: 08/22/2017 22:13   Mr Thoracic Spine Wo Contrast  Result Date: 08/23/2017 CLINICAL DATA:  Low-grade fever. Mid back pain. Altered mental status. End-stage renal disease, evaluate for discitis or spinal infection. EXAM: MRI THORACIC SPINE WITHOUT CONTRAST TECHNIQUE: Multiplanar, multisequence MR imaging of the thoracic spine was performed. No intravenous contrast was administered. COMPARISON:  None. FINDINGS: Alignment:  Physiologic. Vertebrae: No fracture, evidence of discitis, or bone lesion. Cord:  Normal signal and morphology. Paraspinal and other soft tissues: BILATERAL pleural effusions. Tubular 3 cm density layering posteriorly within the pleural space on the LEFT, at the TN4-07level, uncertain significance. Cardiomegaly. Disc levels: No disc protrusion or spinal stenosis. Midthoracic degenerative change with Schmorl's nodes. IMPRESSION: Unremarkable thoracic spine MRI. No thoracic spine vertebral body abnormality, significant disc protrusion, or evidence of spinal infection. BILATERAL pleural effusions, with unusual tubular 3 cm density layering posteriorly on the LEFT. Consider CT chest with contrast for further evaluation. Electronically Signed   By: JStaci RighterM.D.   On: 08/23/2017 14:22   [facility] [address]  Result Date: 08/28/2017 CLINICAL DATA:  52 year old female with shortness of breath, respiratory failure. EXAM: PORTABLE CHEST 1  VIEW COMPARISON:  08/27/2017 and earlier, including chest CT 08/25/2017 FINDINGS: Portable AP semi upright view at 0350 hours. Stable mild cardiomegaly and mediastinal contours. Visualized tracheal air column is within normal limits. No pneumothorax. Stable pulmonary vascularity. Continued dense retrocardiac opacity although mildly improved left lung base ventilation. Residual veiling opacity compatible with pleural effusions seen by CT. No areas of worsening ventilation. Negative visible bowel gas pattern. IMPRESSION: 1. Mildly improved left lung base ventilation since yesterday. 2. Bilateral pleural effusions and lower lobe opacity favored due to atelectasis. Stable pulmonary vascular congestion, with perhaps mild interstitial edema. Electronically Signed   By: Genevie Ann M.D.   On: 08/28/2017 08:33   Dg Chest Port 1 View  Result Date: 08/27/2017 CLINICAL DATA:  Pneumonia. EXAM: PORTABLE CHEST 1 VIEW COMPARISON:  Radiograph of Aug 25, 2017. FINDINGS: Stable cardiomegaly and central pulmonary vascular congestion is noted. No pneumothorax is noted. Mild bibasilar subsegmental atelectasis or edema is noted with possible small pleural effusions. Bony thorax is unremarkable. IMPRESSION: Stable cardiomegaly and central pulmonary vascular congestion. Mild bibasilar subsegmental atelectasis or edema is noted with possible small pleural effusions. Electronically Signed   By: Marijo Conception, M.D.   On: 08/27/2017 07:20   Dg Chest Port 1 View  Result Date: 08/25/2017 CLINICAL DATA:  Acute onset of shortness of breath. EXAM: PORTABLE CHEST 1 VIEW COMPARISON:  Chest radiograph performed 08/21/2017, and CTA of the chest performed earlier today at 6:34 a.m. FINDINGS: The lungs are well-aerated. Vascular congestion is noted. Increased interstitial markings may reflect mild interstitial edema. Small bilateral pleural effusions are suspected. There is no evidence of pneumothorax. The cardiomediastinal silhouette is mildly  enlarged. No acute osseous abnormalities are seen. IMPRESSION: Vascular congestion and mild cardiomegaly. Increased interstitial markings may reflect mild interstitial edema. Small bilateral pleural effusions suspected. Electronically Signed   By: Garald Balding M.D.   On: 08/25/2017 18:38    Assessment:   Kimberly Montgomery is a 52 y.o. female with ESRD admitted with AMS, resp failure and NSTEMI.  On admit no fever and wbc nml. Had UA with 11-20 wbc and ucx with VRE.  Started on aztreonam and linezolid.  Clinically feels well now with no symptoms of UTI.  Recommendations Can stop abx and follow. Thank you very much for allowing me to participate in the care of this patient. Please call with questions.   Cheral Marker. Ola Spurr, MD

## 2017-08-30 DIAGNOSIS — I219 Acute myocardial infarction, unspecified: Secondary | ICD-10-CM

## 2017-08-30 HISTORY — DX: Acute myocardial infarction, unspecified: I21.9

## 2017-08-30 LAB — BASIC METABOLIC PANEL
Anion gap: 10 (ref 5–15)
BUN: 23 mg/dL — AB (ref 6–20)
CALCIUM: 9 mg/dL (ref 8.9–10.3)
CO2: 29 mmol/L (ref 22–32)
CREATININE: 3.66 mg/dL — AB (ref 0.44–1.00)
Chloride: 94 mmol/L — ABNORMAL LOW (ref 101–111)
GFR, EST AFRICAN AMERICAN: 15 mL/min — AB (ref >60)
GFR, EST NON AFRICAN AMERICAN: 13 mL/min — AB (ref >60)
Glucose, Bld: 103 mg/dL — ABNORMAL HIGH (ref 65–99)
Potassium: 4.4 mmol/L (ref 3.5–5.1)
SODIUM: 133 mmol/L — AB (ref 135–145)

## 2017-08-30 LAB — GLUCOSE, CAPILLARY
GLUCOSE-CAPILLARY: 87 mg/dL (ref 65–99)
Glucose-Capillary: 157 mg/dL — ABNORMAL HIGH (ref 65–99)

## 2017-08-30 LAB — TROPONIN I: TROPONIN I: 0.25 ng/mL — AB (ref <0.03)

## 2017-08-30 MED ORDER — INSULIN ASPART 100 UNIT/ML ~~LOC~~ SOLN: 0.0000 [IU] | SUBCUTANEOUS | 11 refills | Status: DC

## 2017-08-30 MED ORDER — HYDROCODONE-ACETAMINOPHEN 5-325 MG PO TABS: 1.0000 | ORAL_TABLET | Freq: Four times a day (QID) | ORAL | 0 refills | Status: DC | PRN

## 2017-08-30 MED ORDER — TRAZODONE HCL 50 MG PO TABS: 25.0000 mg | ORAL_TABLET | Freq: Every evening | ORAL | Status: DC | PRN

## 2017-08-30 MED ORDER — IPRATROPIUM-ALBUTEROL 0.5-2.5 (3) MG/3ML IN SOLN: 3.0000 mL | Freq: Four times a day (QID) | RESPIRATORY_TRACT | Status: DC

## 2017-08-30 MED ORDER — BUDESONIDE 0.5 MG/2ML IN SUSP: 0.5000 mg | Freq: Two times a day (BID) | RESPIRATORY_TRACT | 12 refills | Status: DC

## 2017-08-30 NOTE — Progress Notes (Signed)
[facility], [place] 08/30/17  Subjective:  Patient had some chest pain earlier this a.m. Patient did complete hemodialysis yesterday.   Objective:  Vital signs in last 24 hours:  Temp:  [98.1 F (36.7 C)-99.2 F (37.3 C)] 98.9 F (37.2 C) (06/01 1449) Pulse Rate:  [68-76] 75 (06/01 1449) Resp:  [17-18] 18 (06/01 0734) BP: (137-190)/(56-77) 164/69 (06/01 1449) SpO2:  [95 %-100 %] 100 % (06/01 0737) Weight:  [113.5 kg (250 lb 2 oz)] 113.5 kg (250 lb 2 oz) (06/01 0258)  Weight change: 0.83 kg (1 lb 13.3 oz) Filed Weights   08/29/17 0447 08/29/17 1050 08/30/17 0258  Weight: 115 kg (253 lb 8.5 oz) 115 kg (253 lb 8.5 oz) 113.5 kg (250 lb 2 oz)    Intake/Output:    Intake/Output Summary (Last 24 hours) at 08/30/2017 1507 Last data filed at 08/30/2017 1244 Gross per 24 hour  Intake 480 ml  Output 250 ml  Net 230 ml     Physical Exam: General: Chronically ill appearing   HEENT Ancteric  Neck supple  Pulm/lungs CTAB, normal effort  CVS/Heart Regular rhythm  Abdomen:  Soft, NTND, BS present  Extremities: Rt BKA  Neurologic: Awake, alert, following commands  Skin: No acute rashes  Access: Rt Upper arm AVF       Basic Metabolic Panel:  Recent Labs  Lab 08/27/17 1600 08/28/17 0503 08/29/17 1000 08/30/17 0504  NA  --  137  --  133*  K  --  3.9  --  4.4  CL  --  97*  --  94*  CO2  --  30  --  29  GLUCOSE  --  121*  --  103*  BUN  --  20  --  23*  CREATININE  --  3.30*  --  3.66*  CALCIUM  --  8.6*  --  9.0  PHOS 4.7*  --  5.0*  --      CBC: Recent Labs  Lab 08/25/17 1815 08/26/17 0308 08/27/17 0539 08/28/17 0503  WBC 9.8 6.6 6.6 6.4  NEUTROABS  --   --  4.5 4.7  HGB 11.0* 10.0* 9.0* 9.4*  HCT 33.7* 30.5* 28.0* 28.9*  MCV 88.0 88.2 88.1 88.1  PLT 239 217 238 250      Lab Results  Component Value Date   HEPBSAG Negative 06/01/2016      Microbiology:  Recent Results (from the past 240 hour(s))  MRSA PCR Screening      Status: None   Collection Time: 08/22/17  2:41 AM  Result Value Ref Range Status   MRSA by PCR NEGATIVE NEGATIVE Final    Comment:        The GeneXpert MRSA Assay (FDA approved for NASAL specimens only), is one component of a comprehensive MRSA colonization surveillance program. It is not intended to diagnose MRSA infection nor to guide or monitor treatment for MRSA infections. Performed at [facility], [address], [place], [place] [address]   Urine Culture     Status: Abnormal   Collection Time: 08/22/17 11:30 AM  Result Value Ref Range Status   Specimen Description   Final    URINE, RANDOM Performed at [facility], [address] [place], [place] [address]    Special Requests   Final    NONE Performed at [facility], [address] [place], [place] [address]    Culture >=100,000 COLONIES/mL ENTEROCOCCUS GALLINARUM (A)  Final   Report Status  08/25/2017 FINAL  Final   Organism ID, Bacteria ENTEROCOCCUS GALLINARUM (A)  Final      Susceptibility   Enterococcus gallinarum - MIC*    AMPICILLIN <=2 SENSITIVE Sensitive     LEVOFLOXACIN >=8 RESISTANT Resistant     NITROFURANTOIN <=16 SENSITIVE Sensitive     VANCOMYCIN RESISTANT Resistant     LINEZOLID 2 SENSITIVE Sensitive     * >=100,000 COLONIES/mL ENTEROCOCCUS GALLINARUM  Culture, blood (routine x 2)     Status: None   Collection Time: 08/22/17  3:01 PM  Result Value Ref Range Status   Specimen Description BLOOD HEMODIALYSIS FISTULA  Final   Special Requests   Final    BOTTLES DRAWN AEROBIC AND ANAEROBIC Blood Culture results may not be optimal due to an excessive volume of blood received in culture bottles   Culture   Final    NO GROWTH 5 DAYS Performed at [facility], [address] [place], [place] [address]    Report Status 08/27/2017 FINAL  Final  Culture, blood (routine x 2)     Status: None   Collection Time: 08/22/17  4:11 PM  Result Value Ref Range Status    Specimen Description BLOOD HEMODIALYSIS FISTULA  Final   Special Requests   Final    BOTTLES DRAWN AEROBIC AND ANAEROBIC Blood Culture results may not be optimal due to an excessive volume of blood received in culture bottles   Culture   Final    NO GROWTH 5 DAYS Performed at [facility], [address] [place], [place] [address]    Report Status 08/27/2017 FINAL  Final    Coagulation Studies: No results for input(s): LABPROT, INR in the last 72 hours.  Urinalysis: No results for input(s): COLORURINE, LABSPEC, PHURINE, GLUCOSEU, HGBUR, BILIRUBINUR, KETONESUR, PROTEINUR, UROBILINOGEN, NITRITE, LEUKOCYTESUR in the last 72 hours.  Invalid input(s): APPERANCEUR    Imaging: No results found.   Medications:   . sodium chloride     . amLODipine  5 mg Oral Daily  . aspirin  81 mg Oral Daily  . atorvastatin  40 mg Oral q1800  . budesonide (PULMICORT) nebulizer solution  0.5 mg Nebulization BID  . clopidogrel  75 mg Oral Q breakfast  . epoetin (EPOGEN/PROCRIT) injection  4,000 Units Intravenous Q M,W,F-HD  . escitalopram  2.5 mg Oral Daily  . fluticasone  1 spray Each Nare Daily  . gabapentin  300 mg Oral Daily  . hydrALAZINE  50 mg Oral 3 times per day on Sun Tue Thu Sat   And  . hydrALAZINE  50 mg Oral 2 times per day on Mon Wed Fri  . insulin aspart  0-9 Units Subcutaneous Q4H  . ipratropium-albuterol  3 mL Nebulization Q6H  . isosorbide mononitrate  90 mg Oral Daily  . lactulose  10 g Oral q1800  . levETIRAcetam  1,500 mg Oral q1800  . lidocaine  1 patch Transdermal QHS  . mouth rinse  15 mL Mouth Rinse BID  . Melatonin  5 mg Oral QHS  . metoprolol tartrate  50 mg Oral BID  . multivitamin with minerals  1 tablet Oral Daily  . OXcarbazepine  150 mg Oral QHS  . pantoprazole  40 mg Oral Daily  . polyethylene glycol  17 g Oral Q1200  . polyvinyl alcohol  1 drop Both Eyes TID  . senna-docusate  2 tablet Oral QHS  . sevelamer carbonate  800 mg Oral TID AC & HS   . sodium chloride flush  3 mL Intravenous Q12H   sodium chloride, acetaminophen, benztropine, bisacodyl, clonazePAM **AND** [DISCONTINUED] clonazePAM, hydrALAZINE, HYDROcodone-acetaminophen, lip balm, nitroGLYCERIN, ondansetron **OR** ondansetron (ZOFRAN) IV, sodium chloride flush, sodium phosphate, traZODone  Assessment/ Plan:  52 y.o. caucaisan female  female with end stage renal disease on hemodialysis, COPD/asthma, diabetes mellitus type II, congestive heart failure, coronary artery disease. 5/24- admitted for altered mental status, later had myocardial infarction, s/p PCI to mid circumflex.    [facility]/ [address]/ MWF 107kg Right AVF  1. End Stage Renal Disease with hyperkalemia -No urgent indication for dialysis today.  We will plan for dialysis again on Monday.  2. AOCKD -Continue Epogen 4000 units IV with dialysis.  Most recent he was 9.4.  3. SHPTH -Phosphorus 5.0 at last check.  Continue to monitor.  4. Altered mental status and back pain -Brain MRI was negative for acute CVA.  Thoracic MRI also normal.  Appears resolved.  5.  NSTEMI.  Patient had PCI with stent placement to proximal to mid circumflex with significant improvement of the lesion.    LOS: 8 Adisa Vigeant 6/1/20193:07 PM  [facility] [place], [place] [phone]  Note: This note was prepared with Dragon dictation. Any transcription errors are unintentional

## 2017-08-30 NOTE — Progress Notes (Signed)
SUBJECTIVE: patient denies any chest pain or shortness of breath   Vitals:   08/29/17 1957 08/29/17 1958 08/30/17 0258 08/30/17 0522  BP: (!) 190/71   (!) 172/77  Pulse: 75   69  Resp: 17   18  Temp: 99.2 F (37.3 C)   98.1 F (36.7 C)  TempSrc: Oral   Oral  SpO2: 95% 96%  100%  Weight:   250 lb 2 oz (113.5 kg)   Height:        Intake/Output Summary (Last 24 hours) at 08/30/2017 0720 Last data filed at 08/30/2017 0630 Gross per 24 hour  Intake 240 ml  Output 2200 ml  Net -1960 ml    LABS: Basic Metabolic Panel: Recent Labs    08/27/17 1600 08/28/17 0503 08/29/17 1000 08/30/17 0504  NA  --  137  --  133*  K  --  3.9  --  4.4  CL  --  97*  --  94*  CO2  --  30  --  29  GLUCOSE  --  121*  --  103*  BUN  --  20  --  23*  CREATININE  --  3.30*  --  3.66*  CALCIUM  --  8.6*  --  9.0  PHOS 4.7*  --  5.0*  --    Liver Function Tests: No results for input(s): AST, ALT, ALKPHOS, BILITOT, PROT, ALBUMIN in the last 72 hours. No results for input(s): LIPASE, AMYLASE in the last 72 hours. CBC: Recent Labs    08/28/17 0503  WBC 6.4  NEUTROABS 4.7  HGB 9.4*  HCT 28.9*  MCV 88.1  PLT 250   Cardiac Enzymes: No results for input(s): CKTOTAL, CKMB, CKMBINDEX, TROPONINI in the last 72 hours. BNP: Invalid input(s): POCBNP D-Dimer: No results for input(s): DDIMER in the last 72 hours. Hemoglobin A1C: No results for input(s): HGBA1C in the last 72 hours. Fasting Lipid Panel: No results for input(s): CHOL, HDL, LDLCALC, TRIG, CHOLHDL, LDLDIRECT in the last 72 hours. Thyroid Function Tests: No results for input(s): TSH, T4TOTAL, T3FREE, THYROIDAB in the last 72 hours.  Invalid input(s): FREET3 Anemia Panel: No results for input(s): VITAMINB12, FOLATE, FERRITIN, TIBC, IRON, RETICCTPCT in the last 72 hours.   PHYSICAL EXAM General: Well developed, well nourished, in no acute distress HEENT:  Normocephalic and atramatic Neck:  No JVD.  Lungs: Clear bilaterally to  auscultation and percussion. Heart: HRRR . Normal S1 and S2 without gallops or murmurs.  Abdomen: Bowel sounds are positive, abdomen soft and non-tender  Msk:  Back normal, normal gait. Normal strength and tone for age. Extremities: No clubbing, cyanosis or edema.   Neuro: Alert and oriented X 3. Psych:  Good affect, responds appropriately  TELEMETRY: sinus rhythm  ASSESSMENT AND PLAN: status post PCI of proximal left circumflex 90% lesion with drug-eluting stent and doing very well with no chest pain. There is another high-grade lesion in the proximal RCA and will do outpatient stress testing evaluate for ischemia  And was not treated right now. Patient can be discharged with follow-up this Thursday at 10 AM in the office.  Active Problems:   Acute encephalopathy    Kimberly Nancy, MD, [facility] 08/30/2017 7:20 AM

## 2017-08-30 NOTE — Discharge Instructions (Signed)

## 2017-08-30 NOTE — Discharge Summary (Signed)
[facility] - [place] at [facility]  Kimberly Montgomery, 52 y.o., DOB 02-Dec-1965, MRN [id number]. Admission date: 08/21/2017 Discharge Date 08/30/2017 Primary MD Laurier Nancy, MD Admitting Physician Cammy Copa, MD  Admission Diagnosis  Hyperkalemia [E87.5] Acute respiratory failure with hypoxia (HCC) [J96.01] Altered mental status, unspecified altered mental status type [R41.82]  Discharge Diagnosis   Active Problems: Acute non-STEMI Acute hypoxic respiratory failure Acute metabolic encephalopathy Acute on chronic diastolic CHF exasperation Hypertensive Enterococcus UTI End-stage renal disease Nonspecific mediastinal lymphadenopathy:  DM type II    Hospital Course   Kimberly Montgomery  is a 52 y.o. female, NH resident, with a known history of end-stage renal disease on hemodialysis, CAD, CHF, diabetes, bipolar disorder and other comorbidities who currently resides in [facility].  She was sent from there due to acute changes in mental status and lethargy.  Patient on admission was noted to have hyperkalemia.  Noted to have oxygen saturation that was low.  Patient was admitted for further evaluation and treatment.  She was admitted to the ICU.  She was noted to have enterococcus UTI she finished a course of antibiotics with Zyvox for that.  She was seen in consultation by infectious disease.  She also noticed to have acute non-ST MI and was seen by cardiology underwent a cardiac cath.  status post PCI of proximal left circumflex 90% lesion with drug-eluting stent  There is another high-grade lesion in the proximal RCA cardiology will do outpatient stress testing evaluate for ischemia .   Patient's oxygen needs to slowly be weaned off.  Continue incentive spirometry.   Currently on 3 L of oxygen to be continued   Patient also was noted to have incidental lymphadenopathy mediastinal will need a CT scan of the chest to be arranged by primary care provider in 3  months.  Patient to continue her routine hemodialysis as doing previous       Consults  cardiology, intensivist, ID, nephrology  Significant Tests:  See full reports for all details    Dg Chest 1 View  Result Date: 08/21/2017 CLINICAL DATA:  Hypoxia EXAM: CHEST  1 VIEW COMPARISON:  Chest radiograph 07/01/2017 FINDINGS: The image is degraded by body habitus. There is massive cardiomegaly with poor visualization of the costophrenic angles. No focal airspace consolidation or overt pulmonary edema. IMPRESSION: Massive cardiomegaly without focal airspace consolidation. Electronically Signed   By: Deatra Robinson M.D.   On: 08/21/2017 22:30   Ct Head Wo Contrast  Result Date: 08/21/2017 CLINICAL DATA:  Altered level of consciousness. EXAM: CT HEAD WITHOUT CONTRAST TECHNIQUE: Contiguous axial images were obtained from the base of the skull through the vertex without intravenous contrast. COMPARISON:  Head CT and brain MRI 05/31/2016 FINDINGS: Brain: Brain volume is normal for age. No intracranial hemorrhage, mass effect, or midline shift. No hydrocephalus. The basilar cisterns are patent. No evidence of territorial infarct or acute ischemia. No extra-axial or intracranial fluid collection. Vascular: Atherosclerosis of skullbase vasculature without hyperdense vessel or abnormal calcification. Skull: No fracture or focal lesion. Sinuses/Orbits: Paranasal sinuses and mastoid air cells are clear. The visualized orbits are unremarkable. Bilateral cataract extraction. Other: None. IMPRESSION: Negative noncontrast head CT. Electronically Signed   By: Rubye Oaks M.D.   On: 08/21/2017 22:24   Ct Angio Chest Pe W Or Wo Contrast  Result Date: 08/25/2017 CLINICAL DATA:  Acute hypoxic respiratory failure. Shortness of breath. EXAM: CT ANGIOGRAPHY CHEST WITH CONTRAST TECHNIQUE: Multidetector CT imaging of the chest was performed using  the standard protocol during bolus administration of intravenous contrast.  Multiplanar CT image reconstructions and MIPs were obtained to evaluate the vascular anatomy. CONTRAST:  75mL ISOVUE-370 IOPAMIDOL (ISOVUE-370) INJECTION 76% COMPARISON:  Chest radiograph 08/21/2017 FINDINGS: Cardiovascular: Moderately good opacification of central and segmental pulmonary arteries. No focal filling defects are identified. No evidence of significant pulmonary embolus. Mild cardiac enlargement. No pericardial effusions. Coronary artery calcifications. Normal caliber thoracic aorta with scattered calcification. Mediastinum/Nodes: Esophagus is decompressed. Moderately prominent lymph nodes throughout the upper mediastinum. Pretracheal lymph nodes measure up to 2.2 cm diameter. Changes are nonspecific in could represent reactive lymph nodes, metastatic nodes, or lymphoproliferative changes. Lungs/Pleura: Motion artifact limits evaluation. Small to moderate bilateral pleural effusions with basilar atelectasis. Hazy mosaic appearance to the lung parenchyma may represent motion artifact, edema, or air trapping. No pneumothorax. Airways are patent. Upper Abdomen: No acute abnormality. Musculoskeletal: Degenerative changes in the spine. No destructive bone lesions. Review of the MIP images confirms the above findings. IMPRESSION: 1. No evidence of significant pulmonary embolus. 2. Probable congestive changes with cardiac enlargement, bilateral pleural effusions, and mosaic attenuation pattern to the lungs which may indicate edema or motion artifact. 3. Mediastinal lymphadenopathy of nonspecific etiology. 4. Atelectasis in the lung bases. 5. Technically limited examination due to motion artifact. Aortic Atherosclerosis (ICD10-I70.0). Electronically Signed   By: Burman Nieves M.D.   On: 08/25/2017 06:57   Mr Brain Wo Contrast  Result Date: 08/22/2017 CLINICAL DATA:  Confusion. Altered mental status. End-stage renal disease. EXAM: MRI HEAD WITHOUT CONTRAST TECHNIQUE: Multiplanar, multiecho pulse sequences  of the brain and surrounding structures were obtained without intravenous contrast. COMPARISON:  Head CT 08/21/2017 Brain MRI 05/31/2016 FINDINGS: BRAIN: The midline structures are normal. There is no acute infarct or acute hemorrhage. There is no mass lesion or other mass effect. There is no hydrocephalus, dural abnormality or extra-axial collection. Mild periventricular white matter hyperintensity. Atrophy is greater than expected for age. Single focus of chronic microhemorrhage in the left basal ganglia. VASCULAR: Major intracranial arterial and venous sinus flow voids are preserved. SKULL AND UPPER CERVICAL SPINE: The visualized skull base, calvarium, upper cervical spine and extracranial soft tissues are normal. SINUSES/ORBITS: No fluid levels or advanced mucosal thickening. No mastoid or middle ear effusion. The orbits are normal. IMPRESSION: 1. No acute abnormality. 2. Atrophy greater than expected for age. Minimal small vessel disease. Electronically Signed   By: Deatra Robinson M.D.   On: 08/22/2017 22:13   Mr Thoracic Spine Wo Contrast  Result Date: 08/23/2017 CLINICAL DATA:  Low-grade fever. Mid back pain. Altered mental status. End-stage renal disease, evaluate for discitis or spinal infection. EXAM: MRI THORACIC SPINE WITHOUT CONTRAST TECHNIQUE: Multiplanar, multisequence MR imaging of the thoracic spine was performed. No intravenous contrast was administered. COMPARISON:  None. FINDINGS: Alignment:  Physiologic. Vertebrae: No fracture, evidence of discitis, or bone lesion. Cord:  Normal signal and morphology. Paraspinal and other soft tissues: BILATERAL pleural effusions. Tubular 3 cm density layering posteriorly within the pleural space on the LEFT, at the T9-10 level, uncertain significance. Cardiomegaly. Disc levels: No disc protrusion or spinal stenosis. Midthoracic degenerative change with Schmorl's nodes. IMPRESSION: Unremarkable thoracic spine MRI. No thoracic spine vertebral body abnormality,  significant disc protrusion, or evidence of spinal infection. BILATERAL pleural effusions, with unusual tubular 3 cm density layering posteriorly on the LEFT. Consider CT chest with contrast for further evaluation. Electronically Signed   By: Elsie Stain M.D.   On: 08/23/2017 14:22   Dg Chest [facility] [address]  Result Date: 08/28/2017 CLINICAL DATA:  52 year old female with shortness of breath, respiratory failure. EXAM: PORTABLE CHEST 1 VIEW COMPARISON:  08/27/2017 and earlier, including chest CT 08/25/2017 FINDINGS: Portable AP semi upright view at 0350 hours. Stable mild cardiomegaly and mediastinal contours. Visualized tracheal air column is within normal limits. No pneumothorax. Stable pulmonary vascularity. Continued dense retrocardiac opacity although mildly improved left lung base ventilation. Residual veiling opacity compatible with pleural effusions seen by CT. No areas of worsening ventilation. Negative visible bowel gas pattern. IMPRESSION: 1. Mildly improved left lung base ventilation since yesterday. 2. Bilateral pleural effusions and lower lobe opacity favored due to atelectasis. Stable pulmonary vascular congestion, with perhaps mild interstitial edema. Electronically Signed   By: Odessa Fleming M.D.   On: 08/28/2017 08:33   Dg Chest Port 1 View  Result Date: 08/27/2017 CLINICAL DATA:  Pneumonia. EXAM: PORTABLE CHEST 1 VIEW COMPARISON:  Radiograph of Aug 25, 2017. FINDINGS: Stable cardiomegaly and central pulmonary vascular congestion is noted. No pneumothorax is noted. Mild bibasilar subsegmental atelectasis or edema is noted with possible small pleural effusions. Bony thorax is unremarkable. IMPRESSION: Stable cardiomegaly and central pulmonary vascular congestion. Mild bibasilar subsegmental atelectasis or edema is noted with possible small pleural effusions. Electronically Signed   By: Lupita Raider, M.D.   On: 08/27/2017 07:20   Dg Chest Port 1 View  Result Date: 08/25/2017 CLINICAL DATA:   Acute onset of shortness of breath. EXAM: PORTABLE CHEST 1 VIEW COMPARISON:  Chest radiograph performed 08/21/2017, and CTA of the chest performed earlier today at 6:34 a.m. FINDINGS: The lungs are well-aerated. Vascular congestion is noted. Increased interstitial markings may reflect mild interstitial edema. Small bilateral pleural effusions are suspected. There is no evidence of pneumothorax. The cardiomediastinal silhouette is mildly enlarged. No acute osseous abnormalities are seen. IMPRESSION: Vascular congestion and mild cardiomegaly. Increased interstitial markings may reflect mild interstitial edema. Small bilateral pleural effusions suspected. Electronically Signed   By: Roanna Raider M.D.   On: 08/25/2017 18:38       Today   Subjective:   Kimberly Montgomery patient currently doing much better denies any complaints chest pain Objective:   Blood pressure (!) 165/63, pulse 68, temperature 98.4 F (36.9 C), temperature source Oral, resp. rate 18, height 5\' 9"  (1.753 m), weight 113.5 kg (250 lb 2 oz), last menstrual period 03/16/2015, SpO2 100 %.  .  Intake/Output Summary (Last 24 hours) at 08/30/2017 0934 Last data filed at 08/30/2017 0630 Gross per 24 hour  Intake 240 ml  Output 2200 ml  Net -1960 ml    Exam VITAL SIGNS: Blood pressure (!) 165/63, pulse 68, temperature 98.4 F (36.9 C), temperature source Oral, resp. rate 18, height 5\' 9"  (1.753 m), weight 113.5 kg (250 lb 2 oz), last menstrual period 03/16/2015, SpO2 100 %.  GENERAL:  52 y.o.-year-old patient lying in the bed with no acute distress.  EYES: Pupils equal, round, reactive to light and accommodation. No scleral icterus. Extraocular muscles intact.  HEENT: Head atraumatic, normocephalic. Oropharynx and nasopharynx clear.  NECK:  Supple, no jugular venous distention. No thyroid enlargement, no tenderness.  LUNGS: Normal breath sounds bilaterally, no wheezing, rales,rhonchi or crepitation. No use of accessory muscles of  respiration.  CARDIOVASCULAR: S1, S2 normal. No murmurs, rubs, or gallops.  ABDOMEN: Soft, nontender, nondistended. Bowel sounds present. No organomegaly or mass.  EXTREMITIES: No pedal edema, cyanosis, or clubbing.  NEUROLOGIC: Cranial nerves II through XII are intact. Muscle strength 5/5 in all extremities. Sensation intact. Gait  not checked.  PSYCHIATRIC: The patient is alert and oriented x 3.  SKIN: No obvious rash, lesion, or ulcer.   Data Review     CBC w Diff:  Lab Results  Component Value Date   WBC 6.4 08/28/2017   HGB 9.4 (L) 08/28/2017   HGB 11.5 (L) 07/17/2014   HCT 28.9 (L) 08/28/2017   HCT 35.2 07/17/2014   PLT 250 08/28/2017   PLT 316 07/17/2014   LYMPHOPCT 16 08/28/2017   LYMPHOPCT 29.3 07/17/2014   MONOPCT 7 08/28/2017   MONOPCT 5.5 07/17/2014   EOSPCT 3 08/28/2017   EOSPCT 1.2 07/17/2014   BASOPCT 1 08/28/2017   BASOPCT 0.7 07/17/2014   CMP:  Lab Results  Component Value Date   NA 133 (L) 08/30/2017   NA 136 (A) 03/27/2016   NA 136 07/17/2014   K 4.4 08/30/2017   K 3.5 07/17/2014   CL 94 (L) 08/30/2017   CL 103 07/17/2014   CO2 29 08/30/2017   CO2 26 07/17/2014   BUN 23 (H) 08/30/2017   BUN 46 (A) 03/27/2016   BUN 32 (H) 07/17/2014   CREATININE 3.66 (H) 08/30/2017   CREATININE 3.45 (H) 07/17/2014   GLU 156 03/27/2016   PROT 7.5 08/21/2017   PROT 5.7 (L) 07/16/2014   ALBUMIN 3.3 (L) 08/23/2017   ALBUMIN 2.6 (L) 07/16/2014   BILITOT 0.5 08/21/2017   BILITOT 0.4 07/16/2014   ALKPHOS 151 (H) 08/21/2017   ALKPHOS 104 07/16/2014   AST 15 08/21/2017   AST 14 (L) 07/16/2014   ALT 9 (L) 08/21/2017   ALT 9 (L) 07/16/2014  .  Micro Results Recent Results (from the past 240 hour(s))  MRSA PCR Screening     Status: None   Collection Time: 08/22/17  2:41 AM  Result Value Ref Range Status   MRSA by PCR NEGATIVE NEGATIVE Final    Comment:        The GeneXpert MRSA Assay (FDA approved for NASAL specimens only), is one component of  a comprehensive MRSA colonization surveillance program. It is not intended to diagnose MRSA infection nor to guide or monitor treatment for MRSA infections. Performed at [facility], [address] [place], [place] [address]   Urine Culture     Status: Abnormal   Collection Time: 08/22/17 11:30 AM  Result Value Ref Range Status   Specimen Description   Final    URINE, RANDOM Performed at [facility], [address] [place], [place] [address]    Special Requests   Final    NONE Performed at [facility], [address] [place], [place] [address]    Culture >=100,000 COLONIES/mL ENTEROCOCCUS GALLINARUM (A)  Final   Report Status 08/25/2017 FINAL  Final   Organism ID, Bacteria ENTEROCOCCUS GALLINARUM (A)  Final      Susceptibility   Enterococcus gallinarum - MIC*    AMPICILLIN <=2 SENSITIVE Sensitive     LEVOFLOXACIN >=8 RESISTANT Resistant     NITROFURANTOIN <=16 SENSITIVE Sensitive     VANCOMYCIN RESISTANT Resistant     LINEZOLID 2 SENSITIVE Sensitive     * >=100,000 COLONIES/mL ENTEROCOCCUS GALLINARUM  Culture, blood (routine x 2)     Status: None   Collection Time: 08/22/17  3:01 PM  Result Value Ref Range Status   Specimen Description BLOOD HEMODIALYSIS FISTULA  Final   Special Requests   Final    BOTTLES DRAWN AEROBIC AND ANAEROBIC Blood Culture results may not be optimal due to an  excessive volume of blood received in culture bottles   Culture   Final    NO GROWTH 5 DAYS Performed at [facility], [address] [place], [place] [address]    Report Status 08/27/2017 FINAL  Final  Culture, blood (routine x 2)     Status: None   Collection Time: 08/22/17  4:11 PM  Result Value Ref Range Status   Specimen Description BLOOD HEMODIALYSIS FISTULA  Final   Special Requests   Final    BOTTLES DRAWN AEROBIC AND ANAEROBIC Blood Culture results may not be optimal due to an excessive volume of blood received in culture bottles    Culture   Final    NO GROWTH 5 DAYS Performed at [facility], [address], [place], [place] [address]    Report Status 08/27/2017 FINAL  Final        Code Status Orders  (From admission, onward)        Start     Ordered   08/22/17 0212  Full code  Continuous     08/22/17 0211    Code Status History    Date Active Date Inactive Code Status Order ID Comments User Context   07/01/2017 1654 07/03/2017 0404 Full Code [id number]  Enedina Finner, MD Inpatient   05/15/2017 0540 05/21/2017 1406 Full Code [id number]  Ihor Austin, MD Inpatient   05/31/2016 1606 06/11/2016 2013 Full Code [id number]  Kathlene Cote, PA-C ED   03/13/2016 2236 03/16/2016 2033 Full Code [id number]  Oralia Manis, MD ED   02/25/2016 1758 02/27/2016 2332 Full Code [id number]  Shaune Pollack, MD Inpatient   01/19/2016 0454 01/20/2016 2103 Full Code [id number]  Hillary Bow, DO ED   12/25/2015 2001 01/04/2016 2024 Full Code [id number]  Houston Siren, MD Inpatient   11/19/2015 0117 11/21/2015 1956 Full Code [id number]  Hugelmeyer, McNary, DO Inpatient   10/06/2015 0208 10/06/2015 1832 Full Code [id number]  Ihor Austin, MD Inpatient   09/13/2015 0214 09/15/2015 1728 Full Code [id number]  Arnaldo Natal, MD Inpatient   08/24/2015 0350 08/31/2015 1847 Full Code [id number]  Ihor Austin, MD Inpatient   07/16/2015 0626 08/02/2015 1848 Full Code [id number]  Ihor Austin, MD ED   07/10/2015 0149 07/12/2015 1301 Full Code [id number]  Oralia Manis, MD Inpatient   05/12/2015 1546 05/16/2015 1735 Full Code [id number]  Auburn Bilberry, MD ED    Advance Directive Documentation     Most Recent Value  Type of Advance Directive  Out of facility DNR (pink MOST or yellow form) [MOST form]  Pre-existing out of facility DNR order (yellow form or pink MOST form)  Pink MOST form placed in chart (order not valid for inpatient use) [FULL CODE]  "MOST" Form in Place?  -           Contact information for follow-up providers    Laurier Nancy,  MD Follow up on 09/11/2017.   Specialty:  Cardiology Contact information: [address] [place] [address] [phone]        Dorothey Baseman, MD Follow up in 4 day(s).   Specialty:  Family Medicine [facility]:  [facility]  Contact information: [address] [place] [address] [phone]            Contact information for after-discharge care    Destination    [facility] [place] [facility] .   Service:  Skilled Nursing Contact information: [address] [place] [address] [phone]  Discharge Medications   Allergies as of 08/30/2017      Reactions   Cephalosporins Anaphylaxis   Patient has tolerated meropenem.    Penicillins Anaphylaxis, Other (See Comments)   Has patient had a PCN reaction causing immediate rash, facial/tongue/throat swelling, SOB or lightheadedness with hypotension: Yes Has patient had a PCN reaction causing severe rash involving mucus membranes or skin necrosis: No Has patient had a PCN reaction that required hospitalization No Has patient had a PCN reaction occurring within the last 10 years: No If all of the above answers are "NO", then may proceed with Cephalosporin use.   Reglan [metoclopramide] Other (See Comments)   Severe EPS (lip, head, body tremors) March 2018   Risperdal [risperidone] Other (See Comments)   Severe EPS (lip, head, body tremors) March 2018   Lamictal [lamotrigine] Other (See Comments)   Reaction:  Hallucinations   Phenergan [promethazine Hcl] Nausea And Vomiting   Sensitivity to medicine   Pravastatin Other (See Comments)   Reaction:  Muscle pain    Sulfa Antibiotics Other (See Comments)   Reaction:  Unknown       Medication List    STOP taking these medications   LEVEMIR 100 UNIT/ML injection Generic drug:  insulin detemir   linagliptin 5 MG Tabs tablet Commonly known as:  TRADJENTA     TAKE these medications   acetaminophen 500 MG tablet Commonly  known as:  TYLENOL Take 500 mg by mouth every 6 (six) hours as needed for moderate pain or fever.   albuterol (2.5 MG/3ML) 0.083% nebulizer solution Commonly known as:  PROVENTIL Take 2.5 mg by nebulization every 4 (four) hours as needed for wheezing or shortness of breath.   amLODipine 10 MG tablet Commonly known as:  NORVASC Take 10 mg by mouth daily.   aspirin 81 MG chewable tablet Chew 81 mg by mouth daily.   atorvastatin 40 MG tablet Commonly known as:  LIPITOR Take 40 mg by mouth daily at 6 PM.   benztropine 0.5 MG tablet Commonly known as:  COGENTIN Take 0.5 mg by mouth 2 (two) times daily.   bisacodyl 10 MG suppository Commonly known as:  DULCOLAX Place 10 mg rectally as needed for moderate constipation.   budesonide 0.5 MG/2ML nebulizer solution Commonly known as:  PULMICORT Take 2 mLs (0.5 mg total) by nebulization 2 (two) times daily.   clonazePAM 0.5 MG tablet Commonly known as:  KLONOPIN Take  tablet (0.25MG ) by mouth every morning and 1 tablet (0.5MG ) by mouth every evening   clopidogrel 75 MG tablet Commonly known as:  PLAVIX Take 75 mg by mouth daily at 12 noon.   escitalopram 5 MG tablet Commonly known as:  LEXAPRO Take 2.5 mg by mouth daily.   fluticasone 50 MCG/ACT nasal spray Commonly known as:  FLONASE Place 1 spray into both nostrils daily.   gabapentin 300 MG capsule Commonly known as:  NEURONTIN Take 300 mg by mouth daily.   hydrALAZINE 50 MG tablet Commonly known as:  APRESOLINE Take 1 tablet (50MG ) by mouth three times daily (Sunday, Tuesday, Thursday & Saturday) and take 1 tablet (50MG ) by mouth twice daily (Monday, Wednesday & Friday).   HYDROcodone-acetaminophen 5-325 MG tablet Commonly known as:  NORCO/VICODIN Take 1 tablet by mouth every 6 (six) hours as needed for moderate pain or severe pain.   insulin aspart 100 UNIT/ML injection Commonly known as:  novoLOG Inject 0-9 Units into the skin every 4 (four) hours. What  changed:    how  much to take  when to take this   ipratropium-albuterol 0.5-2.5 (3) MG/3ML Soln Commonly known as:  DUONEB Take 3 mLs by nebulization every 6 (six) hours.   isosorbide mononitrate 30 MG 24 hr tablet Commonly known as:  IMDUR Take 90 mg by mouth daily at 12 noon.   lactulose 10 GM/15ML solution Commonly known as:  CHRONULAC Take 10 g by mouth daily at 6 PM.   levETIRAcetam 750 MG tablet Commonly known as:  KEPPRA Take 2 tablets (1,500 mg total) by mouth daily at 12 noon.   lidocaine 5 % Commonly known as:  LIDODERM Place 1 patch onto the skin at bedtime.   lidocaine 4 % cream Commonly known as:  LMX Apply 1 application topically daily as needed (pain). To right arm fistula   lisinopril 10 MG tablet Commonly known as:  PRINIVIL,ZESTRIL Take 10 mg by mouth daily at 12 noon.   Melatonin 3 MG Tabs Take 3 mg by mouth at bedtime.   metoprolol tartrate 50 MG tablet Commonly known as:  LOPRESSOR Take 1 tablet (50 mg total) by mouth 2 (two) times daily.   multivitamin Tabs tablet Take 1 tablet by mouth daily.   nitroGLYCERIN 0.4 MG SL tablet Commonly known as:  NITROSTAT Place 0.4 mg under the tongue every 5 (five) minutes as needed for chest pain. Reported on 07/10/2015   omeprazole 20 MG capsule Commonly known as:  PRILOSEC Take 20 mg by mouth 2 (two) times daily.   ondansetron 4 MG disintegrating tablet Commonly known as:  ZOFRAN ODT Take 1 tablet (4 mg total) by mouth every 6 (six) hours as needed for nausea or vomiting.   OXcarbazepine 150 MG tablet Commonly known as:  TRILEPTAL Take 150 mg by mouth at bedtime.   polyethylene glycol packet Commonly known as:  MIRALAX / GLYCOLAX Take 17 g by mouth daily. What changed:  when to take this   REFRESH 1 % ophthalmic solution Generic drug:  carboxymethylcellulose Place 1 drop into both eyes 3 (three) times daily.   senna 8.6 MG Tabs tablet Commonly known as:  SENOKOT Take 2 tablets by mouth 2  (two) times daily.   sevelamer carbonate 800 MG tablet Commonly known as:  RENVELA Take 800 mg by mouth 4 (four) times daily -  before meals and at bedtime.   torsemide 100 MG tablet Commonly known as:  DEMADEX Take 100 mg by mouth daily.   traZODone 50 MG tablet Commonly known as:  DESYREL Take 0.5 tablets (25 mg total) by mouth at bedtime as needed for sleep.          Total Time in preparing paper work, data evaluation and todays exam - 35 minutes  Auburn Bilberry M.D on 08/30/2017 at 9:34 AM [facility]   Office  [phone]

## 2017-08-30 NOTE — Progress Notes (Addendum)
Called nurse-to-nurse report to the receiving facility at this time. All questions answered. Raynald Blend  Called EMS for non-emergent patient transfer to the facility at this time. Jari Favre [address] [facility]

## 2017-08-30 NOTE — Progress Notes (Signed)
Decreased to 1l

## 2017-08-30 NOTE — Clinical Social Work Note (Signed)
The patient will discharge today to [facility] via non-emergent EMS. The patient and the facility are aware and in agreement. The patient shared that she has already updated her family. The CSW has delivered the discharge paperwork and is signing off. Please consult should additional needs arise.  Kimberly Montgomery, MSW, [name] [phone]

## 2017-09-02 ENCOUNTER — Ambulatory Visit (INDEPENDENT_AMBULATORY_CARE_PROVIDER_SITE_OTHER): Payer: Medicare HMO | Admitting: Diagnostic Neuroimaging

## 2017-09-02 ENCOUNTER — Encounter: Payer: Self-pay | Admitting: Diagnostic Neuroimaging

## 2017-09-02 VITALS — BP 155/69 | HR 60

## 2017-09-02 DIAGNOSIS — G2401 Drug induced subacute dyskinesia: Secondary | ICD-10-CM

## 2017-09-02 DIAGNOSIS — R569 Unspecified convulsions: Secondary | ICD-10-CM

## 2017-09-02 NOTE — Progress Notes (Signed)
[facility]  PATIENT: Kimberly Montgomery DOB: [birth year]  REFERRING CLINICIAN:  HISTORY FROM: patient and chart review REASON FOR VISIT: follow up     HISTORICAL  CHIEF COMPLAINT:  Chief Complaint  Patient presents with  . Seizures    rm 6, resides at [facility], [place] , "tremors have stopped"  . Tardive dyskinesia  . Follow-up    HISTORY OF PRESENT ILLNESS:   UPDATE (09/02/17, VRP): Since last visit, tremors are improved since stopping abilify. No seizures. Tolerating meds. No alleviating or aggravating factors.   Also was in [facility] recently (08/21/17 - 08/30/17)  for non-STEMI, enterococcus UTI, hyperkalemia. Now s/p coronary stent. Now doing better.   UPDATE (02/25/17, VRP): Since last visit, doing worse with tremors and tardive dyskinesia. Having diff time with feeding herself. Tolerating levetiracetam for seizure prevention. No alleviating or aggravating factors.   UPDATE (01/07/17, VRP): Since last visit, doing well. No seizures. Tremors are slightly better now on klonopin. Also weaning off gabapentin to see if tremors get better. Tolerating LEV for seizure prevention. No alleviating or aggravating factors.   PRIOR HPI (06/17/16): 52 year old female with hypertension, end-stage renal disease, here for evaluation of seizures. Patient presented to [facility] on 05/31/16 for altered mental status, hypertensive emergency, seizure activity. Patient was treated with Keppra and seizure stopped. She was also having tremors, possibly extrapyramidal symptoms related to Reglan and Risperdal. Tremors gradually reduced with medication adjustment. Patient also had coag-negative methicillin-resistant staph bacteremia, felt to be contamination. Transthoracic and transesophageal echocardiogram were unremarkable. Since that time no further seizures. Patient tolerating medications. Patient living at [facility] skilled nursing facility in [place].    REVIEW OF  SYSTEMS: Full 14 system review of systems performed and negative with exception of: only as per HPI.   ALLERGIES: Allergies  Allergen Reactions  . Cephalosporins Anaphylaxis    Patient has tolerated meropenem.   [name] Penicillins Anaphylaxis and Other (See Comments)    Has patient had a PCN reaction causing immediate rash, facial/tongue/throat swelling, SOB or lightheadedness with hypotension: Yes Has patient had a PCN reaction causing severe rash involving mucus membranes or skin necrosis: No Has patient had a PCN reaction that required hospitalization No Has patient had a PCN reaction occurring within the last 10 years: No If all of the above answers are "NO", then may proceed with Cephalosporin use.  . Reglan [Metoclopramide] Other (See Comments)    Severe EPS (lip, head, body tremors) March 2018  . Risperdal [Risperidone] Other (See Comments)    Severe EPS (lip, head, body tremors) March 2018  . Lamictal [Lamotrigine] Other (See Comments)    Reaction:  Hallucinations  . Phenergan [Promethazine Hcl] Nausea And Vomiting    Sensitivity to medicine  . Sulfamethoxazole-Trimethoprim     Other reaction(s): hyperkalemia  . Sulfasalazine Other (See Comments)    Reaction:  Unknown   . Pravastatin Other (See Comments)    Reaction:  Muscle pain   . Sulfa Antibiotics Other (See Comments)    Reaction:  Unknown     HOME MEDICATIONS: Outpatient Medications Prior to Visit  Medication Sig Dispense Refill  . acetaminophen (TYLENOL) 500 MG tablet Take 500 mg by mouth every 6 (six) hours as needed for moderate pain or fever.    [name] albuterol (PROVENTIL) (2.5 MG/3ML) 0.083% nebulizer solution Take 2.5 mg by nebulization every 4 (four) hours as needed for wheezing or shortness of breath.    [name] amLODipine (NORVASC) 10 MG tablet Take 10 mg  by mouth daily.     [name] aspirin 81 MG chewable tablet Chew 81 mg by mouth daily.    [name] atorvastatin (LIPITOR) 40 MG tablet Take 40 mg by mouth daily at 6 PM.    .  benztropine (COGENTIN) 0.5 MG tablet Take 0.5 mg by mouth 2 (two) times daily.    . bisacodyl (DULCOLAX) 10 MG suppository Place 10 mg rectally as needed for moderate constipation.    . budesonide (PULMICORT) 0.5 MG/2ML nebulizer solution Take 2 mLs (0.5 mg total) by nebulization 2 (two) times daily.  12  . carboxymethylcellulose (REFRESH) 1 % ophthalmic solution Place 1 drop into both eyes 3 (three) times daily.    . clonazePAM (KLONOPIN) 0.5 MG tablet Take  tablet (0.25MG ) by mouth every morning and 1 tablet (0.5MG ) by mouth every evening    . clopidogrel (PLAVIX) 75 MG tablet Take 75 mg by mouth daily at 12 noon.     . escitalopram (LEXAPRO) 5 MG tablet Take 2.5 mg by mouth daily.    . fluticasone (FLONASE) 50 MCG/ACT nasal spray Place 1 spray into both nostrils daily.     [name] gabapentin (NEURONTIN) 300 MG capsule Take 300 mg by mouth daily.     . hydrALAZINE (APRESOLINE) 50 MG tablet Take 1 tablet (50MG ) by mouth three times daily (Sunday, Tuesday, Thursday & Saturday) and take 1 tablet (50MG ) by mouth twice daily (Monday, Wednesday & Friday).    [name] HYDROcodone-acetaminophen (NORCO/VICODIN) 5-325 MG tablet Take 1 tablet by mouth every 6 (six) hours as needed for moderate pain or severe pain. 4 tablet 0  . insulin aspart (NOVOLOG) 100 UNIT/ML injection Inject 0-9 Units into the skin every 4 (four) hours. 10 mL 11  . ipratropium-albuterol (DUONEB) 0.5-2.5 (3) MG/3ML SOLN Take 3 mLs by nebulization every 6 (six) hours. 360 mL   . isosorbide mononitrate (IMDUR) 30 MG 24 hr tablet Take 90 mg by mouth daily at 12 noon.     . lactulose (CHRONULAC) 10 GM/15ML solution Take 10 g by mouth daily at 6 PM.     . levETIRAcetam (KEPPRA) 750 MG tablet Take 2 tablets (1,500 mg total) by mouth daily at 12 noon. 30 tablet 0  . lidocaine (LIDODERM) 5 % Place 1 patch onto the skin at bedtime.     . lidocaine (LMX) 4 % cream Apply 1 application topically daily as needed (pain). To right arm fistula    . lisinopril  (PRINIVIL,ZESTRIL) 10 MG tablet Take 10 mg by mouth daily at 12 noon.    . Melatonin 3 MG TABS Take 3 mg by mouth at bedtime.    . metoprolol (LOPRESSOR) 50 MG tablet Take 1 tablet (50 mg total) by mouth 2 (two) times daily.    . multivitamin (ONE-A-DAY MEN'S) TABS tablet Take 1 tablet by mouth daily.    . nitroGLYCERIN (NITROSTAT) 0.4 MG SL tablet Place 0.4 mg under the tongue every 5 (five) minutes as needed for chest pain. Reported on 07/10/2015    . omeprazole (PRILOSEC) 20 MG capsule Take 20 mg by mouth 2 (two) times daily.     . ondansetron (ZOFRAN ODT) 4 MG disintegrating tablet Take 1 tablet (4 mg total) by mouth every 6 (six) hours as needed for nausea or vomiting. 20 tablet 0  . OXcarbazepine (TRILEPTAL) 150 MG tablet Take 150 mg by mouth at bedtime.    . polyethylene glycol (MIRALAX / GLYCOLAX) packet Take 17 g by mouth daily. (Patient taking differently: Take 17 g by  mouth daily at 12 noon. ) 14 each 0  . senna (SENOKOT) 8.6 MG TABS tablet Take 2 tablets by mouth 2 (two) times daily.     . sevelamer carbonate (RENVELA) 800 MG tablet Take 800 mg by mouth 4 (four) times daily -  before meals and at bedtime.     . torsemide (DEMADEX) 100 MG tablet Take 100 mg by mouth daily.     . traZODone (DESYREL) 50 MG tablet Take 0.5 tablets (25 mg total) by mouth at bedtime as needed for sleep.     No facility-administered medications prior to visit.     PAST MEDICAL HISTORY: Past Medical History:  Diagnosis Date  . Anginal pain (HCC)   . Anxiety   . Bipolar disorder (HCC)   . CAD (coronary artery disease)   . CHF (congestive heart failure) (HCC)   . Chronic lower back pain   . Depression   . Endometriosis   . ESRD (end stage renal disease) on dialysis (HCC)    "[facility]; [address]; [place]; TTS" (01/19/2016)  . Gastroparesis   . GERD (gastroesophageal reflux disease)   . Heart attack (HCC) 08/2017   stent placed, [facility]  . History of blood transfusion "several"   "my  blood would get low; low RBC"  . History of hiatal hernia   . HLD (hyperlipidemia)   . Hypertension   . Migraine    "monthly" (01/19/2016)  . Myocardial infarction (HCC) 2017   "~ 3 wks ago" (01/19/2016)  . Renal disorder   . Renal insufficiency   . Seizures (HCC) 07/2015   "I've only had the 1; don't know what from" (01/19/2016)  . Stroke (HCC)   . Type II diabetes mellitus (HCC)     PAST SURGICAL HISTORY: Past Surgical History:  Procedure Laterality Date  . ABDOMINAL HYSTERECTOMY     "partial"  . AV FISTULA PLACEMENT Right 08/23/2016   Procedure: ARTERIOVENOUS (AV) FISTULA CREATION  ( BRACHIAL CEPHALIC );  Surgeon: Renford Dills, MD;  Location: [facility] ORS;  Service: Vascular;  Laterality: Right;  . BELOW KNEE LEG AMPUTATION Right 2010?  [name] CARDIAC CATHETERIZATION Right 07/10/2015   Procedure: Left Heart Cath and Coronary Angiography;  Surgeon: Laurier Nancy, MD;  Location: [facility] INVASIVE CV LAB;  Service: Cardiovascular;  Laterality: Right;  . CARDIAC CATHETERIZATION Right 09/14/2015   Procedure: Left Heart Cath and Coronary Angiography;  Surgeon: Laurier Nancy, MD;  Location: [facility] INVASIVE CV LAB;  Service: Cardiovascular;  Laterality: Right;  . CARDIAC CATHETERIZATION N/A 09/14/2015   Procedure: Coronary Stent Intervention;  Surgeon: Alwyn Pea, MD;  Location: [facility] INVASIVE CV LAB;  Service: Cardiovascular;  Laterality: N/A;  . CARDIAC CATHETERIZATION Right 11/20/2015   Procedure: Left Heart Cath and Coronary Angiography;  Surgeon: Laurier Nancy, MD;  Location: [facility] INVASIVE CV LAB;  Service: Cardiovascular;  Laterality: Right;  . CATARACT EXTRACTION, BILATERAL    . CORONARY ANGIOPLASTY WITH STENT PLACEMENT  <2017   @ [facility]/notes 07/03/2013  . CORONARY ANGIOPLASTY WITH STENT PLACEMENT    . CORONARY STENT INTERVENTION N/A 08/28/2017   Procedure: CORONARY STENT INTERVENTION;  Surgeon: Alwyn Pea, MD;  Location: [facility] INVASIVE CV LAB;  Service: Cardiovascular;   Laterality: N/A;  . DIALYSIS/PERMA CATHETER REMOVAL N/A 11/12/2016   Procedure: DIALYSIS/PERMA CATHETER REMOVAL;  Surgeon: Renford Dills, MD;  Location: [facility] INVASIVE CV LAB;  Service: Cardiovascular;  Laterality: N/A;  . HYSTEROTOMY    . PERIPHERAL VASCULAR CATHETERIZATION N/A 08/30/2015   Procedure:  Dialysis/Perma Catheter Insertion;  Surgeon: Annice Needy, MD;  Location: [facility] INVASIVE CV LAB;  Service: Cardiovascular;  Laterality: N/A;  . PERIPHERAL VASCULAR CATHETERIZATION N/A 01/01/2016   Procedure: Dialysis/Perma Catheter Insertion;  Surgeon: Annice Needy, MD;  Location: [facility] INVASIVE CV LAB;  Service: Cardiovascular;  Laterality: N/A;  . PERITONEAL CATHETER INSERTION  11/03/2013   [name] 11/03/2013  . PERITONEAL CATHETER REMOVAL  01/01/2016   "took the one from May out; put new PD cath in" (01/19/2016)  . PERITONEAL CATHETER REMOVAL  11/03/2013; 02/11/2014   [name] 11/03/2013; Removal of tunneled catheter/notes 02/11/2014  . RIGHT/LEFT HEART CATH AND CORONARY ANGIOGRAPHY N/A 08/28/2017   Procedure: RIGHT/LEFT HEART CATH AND CORONARY ANGIOGRAPHY;  Surgeon: Laurier Nancy, MD;  Location: [facility] INVASIVE CV LAB;  Service: Cardiovascular;  Laterality: N/A;  . SALPINGOOPHORECTOMY Right    [name] 07/03/2013  . TEE WITHOUT CARDIOVERSION N/A 06/07/2016   Procedure: TRANSESOPHAGEAL ECHOCARDIOGRAM (TEE);  Surgeon: Chrystie Nose, MD;  Location: [facility] ENDOSCOPY;  Service: Cardiovascular;  Laterality: N/A;  . TUBAL LIGATION      FAMILY HISTORY: Family History  Problem Relation Age of Onset  . CAD Unknown   . Diabetes Unknown   . Bipolar disorder Unknown   . Cervical cancer Mother   . Other Father        heart bypass  . Breast cancer Neg Hx     SOCIAL HISTORY:  Social History   Socioeconomic History  . Marital status: Widowed    Spouse name: Not on file  . Number of children: 2  . Years of education: Not on file  . Highest education level: Not on file  Occupational History  . Occupation:  disabled  Social Needs  . Financial resource strain: Not on file  . Food insecurity:    Worry: Not on file    Inability: Not on file  . Transportation needs:    Medical: Not on file    Non-medical: Not on file  Tobacco Use  . Smoking status: Former Smoker    Packs/day: 0.75    Types: Cigarettes    Last attempt to quit: 07/01/2015    Years since quitting: 2.1  . Smokeless tobacco: Never Used  Substance and Sexual Activity  . Alcohol use: No    Alcohol/week: 0.0 oz  . Drug use: No  . Sexual activity: Not Currently  Lifestyle  . Physical activity:    Days per week: Not on file    Minutes per session: Not on file  . Stress: Not on file  Relationships  . Social connections:    Talks on phone: Not on file    Gets together: Not on file    Attends religious service: Not on file    Active member of club or organization: Not on file    Attends meetings of clubs or organizations: Not on file    Relationship status: Not on file  . Intimate partner violence:    Fear of current or ex partner: Not on file    Emotionally abused: Not on file    Physically abused: Not on file    Forced sexual activity: Not on file  Other Topics Concern  . Not on file  Social History Narrative   06/17/16 currently at [facility], [place], [place] [phone]   widowed     PHYSICAL EXAM  GENERAL EXAM/CONSTITUTIONAL: Vitals:  Vitals:   09/02/17 0848  BP: (!) 155/69  Pulse: 60   There is no height or weight  on file to calculate BMI. No exam data present  Patient is in no distress; well developed, nourished and groomed; neck is supple  SLOW MOVEMENTS THROUGHTOUT  CARDIOVASCULAR:  Examination of carotid arteries is normal; no carotid bruits  Regular rate and rhythm, no murmurs  Examination of peripheral vascular system by observation and palpation is normal  EYES:  Ophthalmoscopic exam of optic discs and posterior segments is normal; no papilledema or  hemorrhages  MUSCULOSKELETAL:  Gait, strength, tone, movements noted in Neurologic exam below  NEUROLOGIC: MENTAL STATUS:  No flowsheet data found.  awake, alert, oriented to person, place and time  recent and remote memory intact  normal attention and concentration  language fluent, comprehension intact, naming intact,   fund of knowledge appropriate  CRANIAL NERVE:   2nd - no papilledema on fundoscopic exam  2nd, 3rd, 4th, 6th - pupils equal and reactive to light, visual fields full to confrontation, extraocular muscles --> END GAZE NYSTAGMUS IN ALL DIRECTIONS  5th - facial sensation symmetric  7th - facial strength symmetric  8th - hearing intact  9th - palate elevates symmetrically, uvula midline  11th - shoulder shrug symmetric  12th - tongue protrusion midline  NO HEAD TREMOR; NO MOUTH DYSKINESIA  MOTOR:   NO TREMOR IN BUE  normal bulk and tone, full strength in the BUE, LLE  RIGHT BELOW KNEE AMPUTATION  SENSORY:   normal and symmetric to light touch, temperature, vibration  EXCEPT ABSENT VIB IN LEFT FOOT AND KNEE  COORDINATION:   finger-nose-finger, fine finger movements SLOW  REFLEXES:   deep tendon reflexes TRACE and symmetric  RIGHT BELOW KNEE AMPUTATION  GAIT/STATION:   IN WHEELCHAIR; RIGHT BELOW KNEE AMPUTATION; CANNOT STAND UP    DIAGNOSTIC DATA (LABS, IMAGING, TESTING) - I reviewed patient records, labs, notes, testing and imaging myself where available.  Lab Results  Component Value Date   WBC 6.4 08/28/2017   HGB 9.4 (L) 08/28/2017   HCT 28.9 (L) 08/28/2017   MCV 88.1 08/28/2017   PLT 250 08/28/2017      Component Value Date/Time   NA 133 (L) 08/30/2017 0504   NA 136 (A) 03/27/2016   NA 136 07/17/2014 0417   K 4.4 08/30/2017 0504   K 3.5 07/17/2014 0417   CL 94 (L) 08/30/2017 0504   CL 103 07/17/2014 0417   CO2 29 08/30/2017 0504   CO2 26 07/17/2014 0417   GLUCOSE 103 (H) 08/30/2017 0504   GLUCOSE 221 (H)  07/17/2014 0417   BUN 23 (H) 08/30/2017 0504   BUN 46 (A) 03/27/2016   BUN 32 (H) 07/17/2014 0417   CREATININE 3.66 (H) 08/30/2017 0504   CREATININE 3.45 (H) 07/17/2014 0417   CALCIUM 9.0 08/30/2017 0504   CALCIUM 8.4 (L) 07/17/2014 0417   PROT 7.5 08/21/2017 2236   PROT 5.7 (L) 07/16/2014 2132   ALBUMIN 3.3 (L) 08/23/2017 0522   ALBUMIN 2.6 (L) 07/16/2014 2132   AST 15 08/21/2017 2236   AST 14 (L) 07/16/2014 2132   ALT 9 (L) 08/21/2017 2236   ALT 9 (L) 07/16/2014 2132   ALKPHOS 151 (H) 08/21/2017 2236   ALKPHOS 104 07/16/2014 2132   BILITOT 0.5 08/21/2017 2236   BILITOT 0.4 07/16/2014 2132   GFRNONAA 13 (L) 08/30/2017 0504   GFRNONAA 15 (L) 07/17/2014 0417   GFRAA 15 (L) 08/30/2017 0504   GFRAA 17 (L) 07/17/2014 0417   Lab Results  Component Value Date   CHOL 215 (H) 11/19/2015   HDL 63  11/19/2015   LDLCALC 115 (H) 11/19/2015   TRIG 186 (H) 11/19/2015   CHOLHDL 3.4 11/19/2015   Lab Results  Component Value Date   HGBA1C 5.7 (H) 05/15/2017   Lab Results  Component Value Date   VITAMINB12 635 06/06/2016   Lab Results  Component Value Date   TSH 2.417 06/01/2016    05/31/16 MRI brain 1. No acute intracranial abnormality to explain 7 not altered mental status. 2. Remote lacunar infarcts within the posterior circulation as described. 3. Mild generalized atrophy and white matter disease is advanced for age.  06/02/16 continuous VEEG - This is an abnormal EEGdue to the presence of the following: 1) Generalized polymorphic delta slowing; 2) Intermittent triphasic waves. Taken together, these results suggest severe encephalopathy, most likely due to toxic-metabolic etiology, but medication effect could not be excluded.No epileptiform discharges or seizures were present.       ASSESSMENT AND PLAN  52 y.o. year old female here with end-stage renal disease on hemodialysis, previously presenting with hypertensive emergency, tremors and seizures. Now on antiseizure  medication and stable. Extrapyramidal symptoms and tremors have subsided since abilify was stopped.    Dx:  1. Seizure (HCC)   2. Tardive dyskinesia      PLAN:  SEIZURE PREVENTION (stable; last seizure ~ March 2018) - continue levetiracetam 1500mg  daily  TREMORS / TARDIVE DYSKINESIA (due to prior risperdal, reglan, abilify usage; now resolved) - monitor symptoms for now  Return in about 1 year (around 09/03/2018).    Suanne Marker, MD 09/02/2017, 9:03 AM Certified in Neurology, Neurophysiology and Neuroimaging  [facility] [address] [place], [place] [address] [phone]

## 2017-09-03 ENCOUNTER — Other Ambulatory Visit: Payer: Self-pay | Admitting: Family Medicine

## 2017-09-03 DIAGNOSIS — Z1231 Encounter for screening mammogram for malignant neoplasm of breast: Secondary | ICD-10-CM

## 2017-09-06 ENCOUNTER — Encounter: Payer: Self-pay | Admitting: Emergency Medicine

## 2017-09-06 ENCOUNTER — Emergency Department
Admission: EM | Admit: 2017-09-06 | Discharge: 2017-09-06 | Disposition: A | Discharge status: Skilled Nursing Facility | Payer: Medicare HMO | Attending: Emergency Medicine | Admitting: Emergency Medicine

## 2017-09-06 ENCOUNTER — Emergency Department: Payer: Medicare HMO

## 2017-09-06 ENCOUNTER — Other Ambulatory Visit: Payer: Self-pay

## 2017-09-06 DIAGNOSIS — Z794 Long term (current) use of insulin: Secondary | ICD-10-CM | POA: Diagnosis not present

## 2017-09-06 DIAGNOSIS — Z79899 Other long term (current) drug therapy: Secondary | ICD-10-CM | POA: Diagnosis not present

## 2017-09-06 DIAGNOSIS — R110 Nausea: Secondary | ICD-10-CM | POA: Diagnosis not present

## 2017-09-06 DIAGNOSIS — Z992 Dependence on renal dialysis: Secondary | ICD-10-CM | POA: Insufficient documentation

## 2017-09-06 DIAGNOSIS — N186 End stage renal disease: Secondary | ICD-10-CM | POA: Insufficient documentation

## 2017-09-06 DIAGNOSIS — E1122 Type 2 diabetes mellitus with diabetic chronic kidney disease: Secondary | ICD-10-CM | POA: Insufficient documentation

## 2017-09-06 DIAGNOSIS — I2510 Atherosclerotic heart disease of native coronary artery without angina pectoris: Secondary | ICD-10-CM | POA: Insufficient documentation

## 2017-09-06 DIAGNOSIS — Z7982 Long term (current) use of aspirin: Secondary | ICD-10-CM | POA: Insufficient documentation

## 2017-09-06 DIAGNOSIS — Z955 Presence of coronary angioplasty implant and graft: Secondary | ICD-10-CM | POA: Diagnosis not present

## 2017-09-06 DIAGNOSIS — R1010 Upper abdominal pain, unspecified: Secondary | ICD-10-CM

## 2017-09-06 DIAGNOSIS — I132 Hypertensive heart and chronic kidney disease with heart failure and with stage 5 chronic kidney disease, or end stage renal disease: Secondary | ICD-10-CM | POA: Diagnosis not present

## 2017-09-06 DIAGNOSIS — Z87891 Personal history of nicotine dependence: Secondary | ICD-10-CM | POA: Insufficient documentation

## 2017-09-06 DIAGNOSIS — I5032 Chronic diastolic (congestive) heart failure: Secondary | ICD-10-CM | POA: Insufficient documentation

## 2017-09-06 DIAGNOSIS — K5900 Constipation, unspecified: Secondary | ICD-10-CM

## 2017-09-06 DIAGNOSIS — R1084 Generalized abdominal pain: Secondary | ICD-10-CM

## 2017-09-06 LAB — COMPREHENSIVE METABOLIC PANEL
ALT: 10 U/L — AB (ref 14–54)
AST: 17 U/L (ref 15–41)
Albumin: 3.2 g/dL — ABNORMAL LOW (ref 3.5–5.0)
Alkaline Phosphatase: 134 U/L — ABNORMAL HIGH (ref 38–126)
Anion gap: 9 (ref 5–15)
BUN: 16 mg/dL (ref 6–20)
CHLORIDE: 95 mmol/L — AB (ref 101–111)
CO2: 28 mmol/L (ref 22–32)
CREATININE: 3.53 mg/dL — AB (ref 0.44–1.00)
Calcium: 8.6 mg/dL — ABNORMAL LOW (ref 8.9–10.3)
GFR, EST AFRICAN AMERICAN: 16 mL/min — AB (ref >60)
GFR, EST NON AFRICAN AMERICAN: 14 mL/min — AB (ref >60)
Glucose, Bld: 188 mg/dL — ABNORMAL HIGH (ref 65–99)
POTASSIUM: 4 mmol/L (ref 3.5–5.1)
SODIUM: 132 mmol/L — AB (ref 135–145)
Total Bilirubin: 0.3 mg/dL (ref 0.3–1.2)
Total Protein: 7 g/dL (ref 6.5–8.1)

## 2017-09-06 LAB — CBC WITH DIFFERENTIAL/PLATELET
Basophils Absolute: 0 10*3/uL (ref 0–0.1)
Basophils Relative: 0 %
Eosinophils Absolute: 0.2 10*3/uL (ref 0–0.7)
Eosinophils Relative: 3 %
HEMATOCRIT: 28.5 % — AB (ref 35.0–47.0)
HEMOGLOBIN: 9.2 g/dL — AB (ref 12.0–16.0)
LYMPHS ABS: 0.9 10*3/uL — AB (ref 1.0–3.6)
LYMPHS PCT: 12 %
MCH: 28.9 pg (ref 26.0–34.0)
MCHC: 32.4 g/dL (ref 32.0–36.0)
MCV: 89.1 fL (ref 80.0–100.0)
MONOS PCT: 7 %
Monocytes Absolute: 0.5 10*3/uL (ref 0.2–0.9)
NEUTROS PCT: 78 %
Neutro Abs: 6 10*3/uL (ref 1.4–6.5)
Platelets: 293 10*3/uL (ref 150–440)
RBC: 3.2 MIL/uL — AB (ref 3.80–5.20)
RDW: 17.1 % — ABNORMAL HIGH (ref 11.5–14.5)
WBC: 7.6 10*3/uL (ref 3.6–11.0)

## 2017-09-06 LAB — LACTIC ACID, PLASMA: Lactic Acid, Venous: 1.3 mmol/L (ref 0.5–1.9)

## 2017-09-06 LAB — TROPONIN I: Troponin I: 0.04 ng/mL (ref <0.03)

## 2017-09-06 MED ORDER — SENNOSIDES-DOCUSATE SODIUM 8.6-50 MG PO TABS: 2.0000 | ORAL_TABLET | Freq: Two times a day (BID) | ORAL | 0 refills | Status: DC

## 2017-09-06 MED ORDER — POLYETHYLENE GLYCOL 3350 17 GM/SCOOP PO POWD: ORAL | 0 refills | Status: DC

## 2017-09-06 MED ORDER — MAGNESIUM CITRATE PO SOLN
1.0000 | Freq: Once | ORAL | Status: AC
  Administered 2017-09-06: 1 via ORAL
  Filled 2017-09-06: qty 296

## 2017-09-06 MED ORDER — FENTANYL CITRATE (PF) 100 MCG/2ML IJ SOLN
75.0000 ug | Freq: Once | INTRAMUSCULAR | Status: AC
  Administered 2017-09-06: 75 ug via INTRAVENOUS
  Filled 2017-09-06: qty 2

## 2017-09-06 MED ORDER — IOHEXOL 300 MG/ML  SOLN
100.0000 mL | Freq: Once | INTRAMUSCULAR | Status: AC | PRN
  Administered 2017-09-06: 100 mL via INTRAVENOUS

## 2017-09-06 NOTE — ED Provider Notes (Signed)
Clinical Course as of Sep 07 1910  Sat Sep 06, 2017  1742 Troponin of 0.04 is not compatible with ACS PE dissection or carditis given her symptoms of abdominal pain which appear to be due to constipation as demonstrated on CT imaging.  I reviewed the electronic medical records which also shows that over the past several months she has had troponin level as high as 3.0, and 1 week ago it was 0.25, consistently downtrending over the past few months.  At this point I think this troponin is just residual clearance and further work-up is not needed.   [PS]    Clinical Course User Index [PS] Sharman Cheek, MD   Final diagnoses:  Pain of upper abdomen  Generalized abdominal pain  Constipation, unspecified constipation type      Sharman Cheek, MD 09/06/17 [phone]

## 2017-09-06 NOTE — ED Triage Notes (Signed)
Pt to ED via EMS from [facility] c/o LUQ pain that started this morning and now radiated to RUQ, worse with movement, denies n/v/d.  Was recently in hospital for pneumonia and had an MI.  Last BM yesterday.  Dialysis patient MWF and last went yesterday with full treatment.  Given fentanyl and 4mg  zofran en route.  Wears 2L [place] since discharge from hospital.  A&Ox4, speaking in complete and coherent sentences.

## 2017-09-06 NOTE — ED Notes (Signed)
Daughter called and with pt permission, updated daughter that she will be discharged with dx of constipation.

## 2017-09-06 NOTE — ED Provider Notes (Signed)
[facility] Emergency Department Provider Note  ____________________________________________   First MD Initiated Contact with Patient 09/06/17 1426     (approximate)  I have reviewed the triage vital signs and the nursing notes.   HISTORY  Chief Complaint Abdominal Pain   HPI Kimberly Montgomery is a 52 y.o. female who comes to the emergency department via EMS with sudden onset severe upper abdominal pain that began this morning.  It seems to begin in her left upper quadrant radiating towards her right upper quadrant.  Is associated with constipation.  She has not had a bowel movement over 24 hours she has not passed any flatus in 24 hours.  She has a complex past medical history including coronary artery disease, CHF, end-stage renal disease for which she receives hemodialysis and was last dialyzed yesterday.  She was recently admitted to the hospital for "pneumonia and a heart attack".  She does have a previous history of an abdominal hysterectomy.  Her pain is associated with nausea.  She was given 75 mcg of fentanyl and 4 mg of Zofran in route with minimal relief in her pain.  Past Medical History:  Diagnosis Date  . Anginal pain (HCC)   . Anxiety   . Bipolar disorder (HCC)   . CAD (coronary artery disease)   . CHF (congestive heart failure) (HCC)   . Chronic lower back pain   . Depression   . Endometriosis   . ESRD (end stage renal disease) on dialysis (HCC)    "[facility]; [address]; [place]; TTS" (01/19/2016)  . Gastroparesis   . GERD (gastroesophageal reflux disease)   . Heart attack (HCC) 08/2017   stent placed, [facility]  . History of blood transfusion "several"   "my blood would get low; low RBC"  . History of hiatal hernia   . HLD (hyperlipidemia)   . Hypertension   . Migraine    "monthly" (01/19/2016)  . Myocardial infarction (HCC) 2017   "~ 3 wks ago" (01/19/2016)  . Renal disorder   . Renal insufficiency   . Seizures (HCC)  07/2015   "I've only had the 1; don't know what from" (01/19/2016)  . Stroke (HCC)   . Type II diabetes mellitus [facility])     Patient Active Problem List   Diagnosis Date Noted  . Hyperkalemia 07/01/2017  . Pneumonia 05/15/2017  . Benign essential tremor 01/30/2017  . Seizure (HCC)   . Acute encephalopathy   . Tremor   . Cerebrovascular disease   . Complication from renal dialysis device 04/08/2016  . Colitis 03/13/2016  . Chronic diastolic congestive heart failure (HCC) 01/22/2016  . Pressure injury of skin 01/20/2016  . Bacteremia, coagulase-negative staphylococcal 12/27/2015  . Essential hypertension 11/21/2015  . Anemia of chronic disease 11/21/2015  . Acute respiratory failure with hypoxia (HCC) 11/21/2015  . ESRD on dialysis (HCC) 11/21/2015  . 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 NSTEMI (non-ST elevated myocardial infarction) (HCC) 11/20/2015  . Chest pain, rule out acute myocardial infarction 11/18/2015  . Bipolar I disorder, most recent episode depressed (HCC)   . Altered mental status 08/24/2015  . Ileus (HCC)   . Bipolar I disorder (HCC) 07/25/2015  . Seizures (HCC) 07/25/2015  . Peritonitis (HCC) 07/16/2015  . Unstable angina (HCC) 07/09/2015  . Accelerated hypertension 07/09/2015  . Type 2 diabetes mellitus (HCC) 07/09/2015  . CAD (coronary artery disease) 07/09/2015  . HLD (hyperlipidemia) 07/09/2015  . GERD (gastroesophageal reflux disease) 07/09/2015    Past Surgical History:  Procedure Laterality Date  . ABDOMINAL HYSTERECTOMY     "partial"  . AV FISTULA PLACEMENT Right 08/23/2016   Procedure: ARTERIOVENOUS (AV) FISTULA CREATION  ( BRACHIAL CEPHALIC );  Surgeon: Renford Dills, MD;  Location: [facility] ORS;  Service: Vascular;  Laterality: Right;  . BELOW KNEE LEG AMPUTATION Right 2010?  [name] CARDIAC CATHETERIZATION Right 07/10/2015   Procedure: Left Heart Cath and Coronary Angiography;  Surgeon: Laurier Nancy, MD;  Location: [facility] INVASIVE CV LAB;  Service:  Cardiovascular;  Laterality: Right;  . CARDIAC CATHETERIZATION Right 09/14/2015   Procedure: Left Heart Cath and Coronary Angiography;  Surgeon: Laurier Nancy, MD;  Location: [facility] INVASIVE CV LAB;  Service: Cardiovascular;  Laterality: Right;  . CARDIAC CATHETERIZATION N/A 09/14/2015   Procedure: Coronary Stent Intervention;  Surgeon: Alwyn Pea, MD;  Location: [facility] INVASIVE CV LAB;  Service: Cardiovascular;  Laterality: N/A;  . CARDIAC CATHETERIZATION Right 11/20/2015   Procedure: Left Heart Cath and Coronary Angiography;  Surgeon: Laurier Nancy, MD;  Location: [facility] INVASIVE CV LAB;  Service: Cardiovascular;  Laterality: Right;  . CATARACT EXTRACTION, BILATERAL    . CORONARY ANGIOPLASTY WITH STENT PLACEMENT  <2017   @ [facility]/notes 07/03/2013  . CORONARY ANGIOPLASTY WITH STENT PLACEMENT    . CORONARY STENT INTERVENTION N/A 08/28/2017   Procedure: CORONARY STENT INTERVENTION;  Surgeon: Alwyn Pea, MD;  Location: [facility] INVASIVE CV LAB;  Service: Cardiovascular;  Laterality: N/A;  . DIALYSIS/PERMA CATHETER REMOVAL N/A 11/12/2016   Procedure: DIALYSIS/PERMA CATHETER REMOVAL;  Surgeon: Renford Dills, MD;  Location: [facility] INVASIVE CV LAB;  Service: Cardiovascular;  Laterality: N/A;  . HYSTEROTOMY    . PERIPHERAL VASCULAR CATHETERIZATION N/A 08/30/2015   Procedure: Dialysis/Perma Catheter Insertion;  Surgeon: Annice Needy, MD;  Location: [facility] INVASIVE CV LAB;  Service: Cardiovascular;  Laterality: N/A;  . PERIPHERAL VASCULAR CATHETERIZATION N/A 01/01/2016   Procedure: Dialysis/Perma Catheter Insertion;  Surgeon: Annice Needy, MD;  Location: [facility] INVASIVE CV LAB;  Service: Cardiovascular;  Laterality: N/A;  . PERITONEAL CATHETER INSERTION  11/03/2013   [name] 11/03/2013  . PERITONEAL CATHETER REMOVAL  01/01/2016   "took the one from May out; put new PD cath in" (01/19/2016)  . PERITONEAL CATHETER REMOVAL  11/03/2013; 02/11/2014   [name] 11/03/2013; Removal of tunneled catheter/notes 02/11/2014  .  RIGHT/LEFT HEART CATH AND CORONARY ANGIOGRAPHY N/A 08/28/2017   Procedure: RIGHT/LEFT HEART CATH AND CORONARY ANGIOGRAPHY;  Surgeon: Laurier Nancy, MD;  Location: [facility] INVASIVE CV LAB;  Service: Cardiovascular;  Laterality: N/A;  . SALPINGOOPHORECTOMY Right    [name] 07/03/2013  . TEE WITHOUT CARDIOVERSION N/A 06/07/2016   Procedure: TRANSESOPHAGEAL ECHOCARDIOGRAM (TEE);  Surgeon: Chrystie Nose, MD;  Location: [facility] ENDOSCOPY;  Service: Cardiovascular;  Laterality: N/A;  . TUBAL LIGATION      Prior to Admission medications   Medication Sig Start Date End Date Taking? Authorizing Provider  acetaminophen (TYLENOL) 500 MG tablet Take 500 mg by mouth every 6 (six) hours as needed for moderate pain or fever.    [provider]  albuterol (PROVENTIL) (2.5 MG/3ML) 0.083% nebulizer solution Take 2.5 mg by nebulization every 4 (four) hours as needed for wheezing or shortness of breath.    [provider]  amLODipine (NORVASC) 10 MG tablet Take 10 mg by mouth daily.     [provider]  aspirin 81 MG chewable tablet Chew 81 mg by mouth daily.    [provider]  atorvastatin (LIPITOR) 40 MG tablet Take 40 mg by mouth  daily at 6 PM.    [provider]  benztropine (COGENTIN) 0.5 MG tablet Take 0.5 mg by mouth 2 (two) times daily.    [provider]  bisacodyl (DULCOLAX) 10 MG suppository Place 10 mg rectally as needed for moderate constipation.    [provider]  budesonide (PULMICORT) 0.5 MG/2ML nebulizer solution Take 2 mLs (0.5 mg total) by nebulization 2 (two) times daily. 08/30/17   Auburn Bilberry, MD  carboxymethylcellulose (REFRESH) 1 % ophthalmic solution Place 1 drop into both eyes 3 (three) times daily.    [provider]  clonazePAM (KLONOPIN) 0.5 MG tablet Take  tablet (0.25MG ) by mouth every morning and 1 tablet (0.5MG ) by mouth every evening    [provider]  clopidogrel (PLAVIX) 75 MG tablet Take 75 mg by mouth  daily at 12 noon.     [provider]  escitalopram (LEXAPRO) 5 MG tablet Take 2.5 mg by mouth daily.    [provider]  fluticasone (FLONASE) 50 MCG/ACT nasal spray Place 1 spray into both nostrils daily.     [provider]  gabapentin (NEURONTIN) 300 MG capsule Take 300 mg by mouth daily.     [provider]  hydrALAZINE (APRESOLINE) 50 MG tablet Take 1 tablet (50MG ) by mouth three times daily (Sunday, Tuesday, Thursday & Saturday) and take 1 tablet (50MG ) by mouth twice daily (Monday, Wednesday & Friday).    [provider]  HYDROcodone-acetaminophen (NORCO/VICODIN) 5-325 MG tablet Take 1 tablet by mouth every 6 (six) hours as needed for moderate pain or severe pain. 08/30/17   Auburn Bilberry, MD  insulin aspart (NOVOLOG) 100 UNIT/ML injection Inject 0-9 Units into the skin every 4 (four) hours. 08/30/17   Auburn Bilberry, MD  ipratropium-albuterol (DUONEB) 0.5-2.5 (3) MG/3ML SOLN Take 3 mLs by nebulization every 6 (six) hours. 08/30/17   Auburn Bilberry, MD  isosorbide mononitrate (IMDUR) 30 MG 24 hr tablet Take 90 mg by mouth daily at 12 noon.     [provider]  lactulose (CHRONULAC) 10 GM/15ML solution Take 10 g by mouth daily at 6 PM.     [provider]  levETIRAcetam (KEPPRA) 750 MG tablet Take 2 tablets (1,500 mg total) by mouth daily at 12 noon. 05/20/17   Delfino Lovett, MD  lidocaine (LIDODERM) 5 % Place 1 patch onto the skin at bedtime.     [provider]  lidocaine (LMX) 4 % cream Apply 1 application topically daily as needed (pain). To right arm fistula    [provider]  lisinopril (PRINIVIL,ZESTRIL) 10 MG tablet Take 10 mg by mouth daily at 12 noon.    [provider]  Melatonin 3 MG TABS Take 3 mg by mouth at bedtime.    [provider]  metoprolol (LOPRESSOR) 50 MG tablet Take 1 tablet (50 mg total) by mouth 2 (two) times daily. 09/15/15   Milagros Loll, MD  multivitamin (ONE-A-DAY  MEN'S) TABS tablet Take 1 tablet by mouth daily.    [provider]  nitroGLYCERIN (NITROSTAT) 0.4 MG SL tablet Place 0.4 mg under the tongue every 5 (five) minutes as needed for chest pain. Reported on 07/10/2015    [provider]  omeprazole (PRILOSEC) 20 MG capsule Take 20 mg by mouth 2 (two) times daily.     [provider]  ondansetron (ZOFRAN ODT) 4 MG disintegrating tablet Take 1 tablet (4 mg total) by mouth every 6 (six) hours as needed for nausea or vomiting. 07/16/15  Sharyn Creamer, MD  OXcarbazepine (TRILEPTAL) 150 MG tablet Take 150 mg by mouth at bedtime.    [provider]  polyethylene glycol (MIRALAX / GLYCOLAX) packet Take 17 g by mouth daily. Patient taking differently: Take 17 g by mouth daily at 12 noon.  08/02/15   Adrian Saran, MD  senna (SENOKOT) 8.6 MG TABS tablet Take 2 tablets by mouth 2 (two) times daily.     [provider]  sevelamer carbonate (RENVELA) 800 MG tablet Take 800 mg by mouth 4 (four) times daily -  before meals and at bedtime.     [provider]  torsemide (DEMADEX) 100 MG tablet Take 100 mg by mouth daily.     [provider]  traZODone (DESYREL) 50 MG tablet Take 0.5 tablets (25 mg total) by mouth at bedtime as needed for sleep. 08/30/17   Auburn Bilberry, MD    Allergies Cephalosporins; Penicillins; Reglan [metoclopramide]; Risperdal [risperidone]; Lamictal [lamotrigine]; Phenergan [promethazine hcl]; Sulfamethoxazole-trimethoprim; Sulfasalazine; Pravastatin; and Sulfa antibiotics  Family History  Problem Relation Age of Onset  . CAD Unknown   . Diabetes Unknown   . Bipolar disorder Unknown   . Cervical cancer Mother   . Other Father        heart bypass  . Breast cancer Neg Hx     Social History Social History   Tobacco Use  . Smoking status: Former Smoker    Packs/day: 0.75    Types: Cigarettes    Last attempt to quit: 07/01/2015    Years since quitting: 2.1  . Smokeless tobacco:  Never Used  Substance Use Topics  . Alcohol use: No    Alcohol/week: 0.0 oz  . Drug use: No    Review of Systems Constitutional: No fever/chills Eyes: No visual changes. ENT: No sore throat. Cardiovascular: Denies chest pain. Respiratory: Denies shortness of breath. Gastrointestinal: Positive for abdominal pain.  Positive for nausea, no vomiting.  No diarrhea.  Positive for constipation. Genitourinary: Negative for dysuria. Musculoskeletal: Negative for back pain. Skin: Negative for rash. Neurological: Negative for headaches, focal weakness or numbness.   ____________________________________________   PHYSICAL EXAM:  VITAL SIGNS: ED Triage Vitals  Enc Vitals Group     BP      Pulse      Resp      Temp      Temp src      SpO2      Weight      Height      Head Circumference      Peak Flow      Pain Score      Pain Loc      Pain Edu?      Excl. in GC?     Constitutional: Alert and oriented x4 appears miserable moaning in pain extremely uncomfortable with movement Eyes: PERRL EOMI. Head: Atraumatic. Nose: No congestion/rhinnorhea. Mouth/Throat: No trismus Neck: No stridor.   Cardiovascular: Normal rate, regular rhythm. Grossly normal heart sounds.  Good peripheral circulation. Respiratory: Slightly increased respiratory effort with crackles at bilateral bases Gastrointestinal: Distended abdomen quite tender diffusely primarily right greater than left upper quadrants although no frank peritonitis Musculoskeletal: No bony tenderness Neurologic:  Normal speech and language. No gross focal neurologic deficits are appreciated. Skin:  Skin is warm, dry and intact. No rash noted. Psychiatric: Mood and affect are normal. Speech and behavior are normal.    ____________________________________________   DIFFERENTIAL includes but not limited to  Gastritis, pancreatitis, large bowel obstruction, small bowel obstruction,  volvulus, pyelonephritis,  sepsis ____________________________________________   LABS (all labs ordered are listed, but only abnormal results are displayed)  Labs Reviewed  COMPREHENSIVE METABOLIC PANEL - Abnormal; Notable for the following components:      Result Value   Sodium 132 (*)    Chloride 95 (*)    Glucose, Bld 188 (*)    Creatinine, Ser 3.53 (*)    Calcium 8.6 (*)    Albumin 3.2 (*)    ALT 10 (*)    Alkaline Phosphatase 134 (*)    GFR calc non Af Amer 14 (*)    GFR calc Af Amer 16 (*)    All other components within normal limits  CBC WITH DIFFERENTIAL/PLATELET - Abnormal; Notable for the following components:   RBC 3.20 (*)    Hemoglobin 9.2 (*)    HCT 28.5 (*)    RDW 17.1 (*)    Lymphs Abs 0.9 (*)    All other components within normal limits  LACTIC ACID, PLASMA  LACTIC ACID, PLASMA  TROPONIN I  URINALYSIS, COMPLETE (UACMP) WITH MICROSCOPIC    Lab work reviewed by me with chronic kidney disease otherwise unremarkable __________________________________________  EKG   ____________________________________________  RADIOLOGY  CT abdomen pelvis is pending at this time ____________________________________________   PROCEDURES  Procedure(s) performed: no  Procedures  Critical Care performed: no  Observation: no ____________________________________________   INITIAL IMPRESSION / ASSESSMENT AND PLAN / ED COURSE  Pertinent labs & imaging results that were available during my care of the patient were reviewed by me and considered in my medical decision making (see chart for details).  Patient arrives quite uncomfortable appearing with a distended and tender abdomen.  Differential is extremely broad but I am concerned about a bowel obstruction.  I appreciate that she has chronic kidney disease and is dialysis dependent so she will be able to get a CT scan with IV contrast today despite her abnormal creatinine.  Given an additional 75 mcg of fentanyl which did somewhat improve her  pain.  Disposition is pending results of lab work and CT scan.  Care signed over to oncoming physician who will determine ultimate disposition once CT scan is done.      ____________________________________________   FINAL CLINICAL IMPRESSION(S) / ED DIAGNOSES  Final diagnoses:  Pain of upper abdomen      NEW MEDICATIONS STARTED DURING THIS VISIT:  New Prescriptions   No medications on file     Note:  This document was prepared using Dragon voice recognition software and may include unintentional dictation errors.     Merrily Brittle, MD 09/06/17 [phone]

## 2017-09-25 ENCOUNTER — Ambulatory Visit
Admission: RE | Admit: 2017-09-25 | Discharge: 2017-09-25 | Disposition: A | Discharge status: Home / Self Care | Payer: Medicare HMO | Source: Ambulatory Visit | Attending: Family Medicine | Admitting: Family Medicine

## 2017-09-25 DIAGNOSIS — Z1231 Encounter for screening mammogram for malignant neoplasm of breast: Secondary | ICD-10-CM | POA: Diagnosis not present

## 2017-09-29 ENCOUNTER — Other Ambulatory Visit: Payer: Self-pay | Admitting: Family Medicine

## 2017-09-29 DIAGNOSIS — N6489 Other specified disorders of breast: Secondary | ICD-10-CM

## 2017-09-29 DIAGNOSIS — R928 Other abnormal and inconclusive findings on diagnostic imaging of breast: Secondary | ICD-10-CM

## 2017-10-06 ENCOUNTER — Ambulatory Visit (INDEPENDENT_AMBULATORY_CARE_PROVIDER_SITE_OTHER): Payer: Medicare HMO | Admitting: Vascular Surgery

## 2017-10-06 ENCOUNTER — Encounter (INDEPENDENT_AMBULATORY_CARE_PROVIDER_SITE_OTHER): Payer: Medicare HMO

## 2017-10-21 ENCOUNTER — Ambulatory Visit
Admission: RE | Admit: 2017-10-21 | Discharge: 2017-10-21 | Disposition: A | Discharge status: Home / Self Care | Payer: Medicare HMO | Source: Ambulatory Visit | Attending: Family Medicine | Admitting: Family Medicine

## 2017-10-21 DIAGNOSIS — R928 Other abnormal and inconclusive findings on diagnostic imaging of breast: Secondary | ICD-10-CM

## 2017-10-21 DIAGNOSIS — N6489 Other specified disorders of breast: Secondary | ICD-10-CM | POA: Diagnosis present

## 2017-11-24 ENCOUNTER — Emergency Department: Payer: Medicare HMO

## 2017-11-24 ENCOUNTER — Other Ambulatory Visit: Payer: Self-pay

## 2017-11-24 ENCOUNTER — Encounter: Payer: Self-pay | Admitting: Emergency Medicine

## 2017-11-24 ENCOUNTER — Emergency Department
Admission: EM | Admit: 2017-11-24 | Discharge: 2017-11-24 | Disposition: A | Discharge status: Home / Self Care | Payer: Medicare HMO | Attending: Emergency Medicine | Admitting: Emergency Medicine

## 2017-11-24 DIAGNOSIS — Z794 Long term (current) use of insulin: Secondary | ICD-10-CM | POA: Diagnosis not present

## 2017-11-24 DIAGNOSIS — S0990XA Unspecified injury of head, initial encounter: Secondary | ICD-10-CM | POA: Insufficient documentation

## 2017-11-24 DIAGNOSIS — I252 Old myocardial infarction: Secondary | ICD-10-CM | POA: Diagnosis not present

## 2017-11-24 DIAGNOSIS — Y939 Activity, unspecified: Secondary | ICD-10-CM | POA: Insufficient documentation

## 2017-11-24 DIAGNOSIS — W19XXXA Unspecified fall, initial encounter: Secondary | ICD-10-CM | POA: Diagnosis not present

## 2017-11-24 DIAGNOSIS — W0110XA Fall on same level from slipping, tripping and stumbling with subsequent striking against unspecified object, initial encounter: Secondary | ICD-10-CM | POA: Insufficient documentation

## 2017-11-24 DIAGNOSIS — I25119 Atherosclerotic heart disease of native coronary artery with unspecified angina pectoris: Secondary | ICD-10-CM | POA: Insufficient documentation

## 2017-11-24 DIAGNOSIS — Z992 Dependence on renal dialysis: Secondary | ICD-10-CM | POA: Diagnosis not present

## 2017-11-24 DIAGNOSIS — Y999 Unspecified external cause status: Secondary | ICD-10-CM | POA: Diagnosis not present

## 2017-11-24 DIAGNOSIS — N186 End stage renal disease: Secondary | ICD-10-CM | POA: Diagnosis not present

## 2017-11-24 DIAGNOSIS — Y92129 Unspecified place in nursing home as the place of occurrence of the external cause: Secondary | ICD-10-CM | POA: Insufficient documentation

## 2017-11-24 DIAGNOSIS — I132 Hypertensive heart and chronic kidney disease with heart failure and with stage 5 chronic kidney disease, or end stage renal disease: Secondary | ICD-10-CM | POA: Diagnosis not present

## 2017-11-24 DIAGNOSIS — R4701 Aphasia: Secondary | ICD-10-CM | POA: Insufficient documentation

## 2017-11-24 DIAGNOSIS — I5032 Chronic diastolic (congestive) heart failure: Secondary | ICD-10-CM | POA: Diagnosis not present

## 2017-11-24 DIAGNOSIS — E1122 Type 2 diabetes mellitus with diabetic chronic kidney disease: Secondary | ICD-10-CM | POA: Insufficient documentation

## 2017-11-24 LAB — TROPONIN I: Troponin I: 0.03 ng/mL (ref <0.03)

## 2017-11-24 LAB — DIFFERENTIAL
BASOS ABS: 0 10*3/uL (ref 0–0.1)
Basophils Relative: 1 %
Eosinophils Absolute: 0.2 10*3/uL (ref 0–0.7)
Eosinophils Relative: 3 %
LYMPHS ABS: 1 10*3/uL (ref 1.0–3.6)
LYMPHS PCT: 21 %
Monocytes Absolute: 0.3 10*3/uL (ref 0.2–0.9)
Monocytes Relative: 6 %
NEUTROS PCT: 69 %
Neutro Abs: 3.4 10*3/uL (ref 1.4–6.5)

## 2017-11-24 LAB — APTT: APTT: 32 s (ref 24–36)

## 2017-11-24 LAB — PROTIME-INR
INR: 1.02
Prothrombin Time: 13.3 seconds (ref 11.4–15.2)

## 2017-11-24 LAB — CBC
HCT: 37.1 % (ref 35.0–47.0)
HEMOGLOBIN: 11.8 g/dL — AB (ref 12.0–16.0)
MCH: 27.4 pg (ref 26.0–34.0)
MCHC: 31.9 g/dL — ABNORMAL LOW (ref 32.0–36.0)
MCV: 86 fL (ref 80.0–100.0)
Platelets: 154 10*3/uL (ref 150–440)
RBC: 4.31 MIL/uL (ref 3.80–5.20)
RDW: 16.6 % — ABNORMAL HIGH (ref 11.5–14.5)
WBC: 5 10*3/uL (ref 3.6–11.0)

## 2017-11-24 LAB — COMPREHENSIVE METABOLIC PANEL
ALBUMIN: 3.7 g/dL (ref 3.5–5.0)
ALK PHOS: 153 U/L — AB (ref 38–126)
ALT: 10 U/L (ref 0–44)
AST: 18 U/L (ref 15–41)
Anion gap: 9 (ref 5–15)
BILIRUBIN TOTAL: 0.6 mg/dL (ref 0.3–1.2)
BUN: 26 mg/dL — AB (ref 6–20)
CO2: 32 mmol/L (ref 22–32)
Calcium: 8.6 mg/dL — ABNORMAL LOW (ref 8.9–10.3)
Chloride: 97 mmol/L — ABNORMAL LOW (ref 98–111)
Creatinine, Ser: 3.7 mg/dL — ABNORMAL HIGH (ref 0.44–1.00)
GFR calc Af Amer: 15 mL/min — ABNORMAL LOW (ref >60)
GFR calc non Af Amer: 13 mL/min — ABNORMAL LOW (ref >60)
GLUCOSE: 158 mg/dL — AB (ref 70–99)
POTASSIUM: 3.8 mmol/L (ref 3.5–5.1)
Sodium: 138 mmol/L (ref 135–145)
TOTAL PROTEIN: 7.2 g/dL (ref 6.5–8.1)

## 2017-11-24 MED ORDER — PANTOPRAZOLE SODIUM 20 MG PO TBEC
20.0000 mg | DELAYED_RELEASE_TABLET | Freq: Once | ORAL | Status: AC
  Administered 2017-11-24: 20 mg via ORAL
  Filled 2017-11-24: qty 1

## 2017-11-24 NOTE — ED Provider Notes (Addendum)
[facility] Emergency Department Provider Note ____________________________________________   First MD Initiated Contact with Patient 11/24/17 1118     (approximate)  I have reviewed the triage vital signs and the nursing notes.   HISTORY  Chief Complaint Fall and Altered Mental Status   HPI JULYSSA BLACKLER is a 52 y.o. female with a history of CHF, CAD bipolar disorder with end-stage renal disease on dialysis was presented to the emergency department with stuttering speech with difficulty with word finding or since having a fall this past Friday.  Patient says that she was holding onto a railing when she slipped falling backwards onto the back of her head.  She says that she lost consciousness but does not know for how long.  Ever since then she has been having worsening difficulty with her speech as well as a throbbing headache to the back of her head which she describes as an 8 out of 10 at this time.  She says that she had a full session of dialysis today.  Says that she does not make urine.  Denies any weakness or numbness otherwise except for baseline weakness to her right upper extremity which she says is unchanged.  Past Medical History:  Diagnosis Date  . Anginal pain (HCC)   . Anxiety   . Bipolar disorder (HCC)   . CAD (coronary artery disease)   . CHF (congestive heart failure) (HCC)   . Chronic lower back pain   . Depression   . Endometriosis   . ESRD (end stage renal disease) on dialysis (HCC)    "[facility]; [address]; [place]; TTS" (01/19/2016)  . Gastroparesis   . GERD (gastroesophageal reflux disease)   . Heart attack (HCC) 08/2017   stent placed, [facility]  . History of blood transfusion "several"   "my blood would get low; low RBC"  . History of hiatal hernia   . HLD (hyperlipidemia)   . Hypertension   . Migraine    "monthly" (01/19/2016)  . Myocardial infarction (HCC) 2017   "~ 3 wks ago" (01/19/2016)  . Renal disorder     . Renal insufficiency   . Seizures (HCC) 07/2015   "I've only had the 1; don't know what from" (01/19/2016)  . Stroke (HCC)   . Type II diabetes mellitus [facility])     Patient Active Problem List   Diagnosis Date Noted  . Hyperkalemia 07/01/2017  . Pneumonia 05/15/2017  . Benign essential tremor 01/30/2017  . Seizure (HCC)   . Acute encephalopathy   . Tremor   . Cerebrovascular disease   . Complication from renal dialysis device 04/08/2016  . Colitis 03/13/2016  . Chronic diastolic congestive heart failure (HCC) 01/22/2016  . Pressure injury of skin 01/20/2016  . Bacteremia, coagulase-negative staphylococcal 12/27/2015  . Essential hypertension 11/21/2015  . Anemia of chronic disease 11/21/2015  . Acute respiratory failure with hypoxia (HCC) 11/21/2015  . ESRD on dialysis (HCC) 11/21/2015  . 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 NSTEMI (non-ST elevated myocardial infarction) (HCC) 11/20/2015  . Chest pain, rule out acute myocardial infarction 11/18/2015  . Bipolar I disorder, most recent episode depressed (HCC)   . Altered mental status 08/24/2015  . Ileus (HCC)   . Bipolar I disorder (HCC) 07/25/2015  . Seizures (HCC) 07/25/2015  . Peritonitis (HCC) 07/16/2015  . Unstable angina (HCC) 07/09/2015  . Accelerated hypertension 07/09/2015  . Type 2 diabetes mellitus (HCC) 07/09/2015  . CAD (coronary artery disease) 07/09/2015  . HLD (hyperlipidemia) 07/09/2015  .  GERD (gastroesophageal reflux disease) 07/09/2015    Past Surgical History:  Procedure Laterality Date  . ABDOMINAL HYSTERECTOMY     "partial"  . AV FISTULA PLACEMENT Right 08/23/2016   Procedure: ARTERIOVENOUS (AV) FISTULA CREATION  ( BRACHIAL CEPHALIC );  Surgeon: Renford Dills, MD;  Location: [facility] ORS;  Service: Vascular;  Laterality: Right;  . BELOW KNEE LEG AMPUTATION Right 2010?  [name] CARDIAC CATHETERIZATION Right 07/10/2015   Procedure: Left Heart Cath and Coronary Angiography;  Surgeon: Laurier Nancy, MD;  Location:  [facility] INVASIVE CV LAB;  Service: Cardiovascular;  Laterality: Right;  . CARDIAC CATHETERIZATION Right 09/14/2015   Procedure: Left Heart Cath and Coronary Angiography;  Surgeon: Laurier Nancy, MD;  Location: [facility] INVASIVE CV LAB;  Service: Cardiovascular;  Laterality: Right;  . CARDIAC CATHETERIZATION N/A 09/14/2015   Procedure: Coronary Stent Intervention;  Surgeon: Alwyn Pea, MD;  Location: [facility] INVASIVE CV LAB;  Service: Cardiovascular;  Laterality: N/A;  . CARDIAC CATHETERIZATION Right 11/20/2015   Procedure: Left Heart Cath and Coronary Angiography;  Surgeon: Laurier Nancy, MD;  Location: [facility] INVASIVE CV LAB;  Service: Cardiovascular;  Laterality: Right;  . CATARACT EXTRACTION, BILATERAL    . CORONARY ANGIOPLASTY WITH STENT PLACEMENT  <2017   @ [facility]/notes 07/03/2013  . CORONARY ANGIOPLASTY WITH STENT PLACEMENT    . CORONARY STENT INTERVENTION N/A 08/28/2017   Procedure: CORONARY STENT INTERVENTION;  Surgeon: Alwyn Pea, MD;  Location: [facility] INVASIVE CV LAB;  Service: Cardiovascular;  Laterality: N/A;  . DIALYSIS/PERMA CATHETER REMOVAL N/A 11/12/2016   Procedure: DIALYSIS/PERMA CATHETER REMOVAL;  Surgeon: Renford Dills, MD;  Location: [facility] INVASIVE CV LAB;  Service: Cardiovascular;  Laterality: N/A;  . HYSTEROTOMY    . PERIPHERAL VASCULAR CATHETERIZATION N/A 08/30/2015   Procedure: Dialysis/Perma Catheter Insertion;  Surgeon: Annice Needy, MD;  Location: [facility] INVASIVE CV LAB;  Service: Cardiovascular;  Laterality: N/A;  . PERIPHERAL VASCULAR CATHETERIZATION N/A 01/01/2016   Procedure: Dialysis/Perma Catheter Insertion;  Surgeon: Annice Needy, MD;  Location: [facility] INVASIVE CV LAB;  Service: Cardiovascular;  Laterality: N/A;  . PERITONEAL CATHETER INSERTION  11/03/2013   [name] 11/03/2013  . PERITONEAL CATHETER REMOVAL  01/01/2016   "took the one from May out; put new PD cath in" (01/19/2016)  . PERITONEAL CATHETER REMOVAL  11/03/2013; 02/11/2014   [name] 11/03/2013; Removal of  tunneled catheter/notes 02/11/2014  . RIGHT/LEFT HEART CATH AND CORONARY ANGIOGRAPHY N/A 08/28/2017   Procedure: RIGHT/LEFT HEART CATH AND CORONARY ANGIOGRAPHY;  Surgeon: Laurier Nancy, MD;  Location: [facility] INVASIVE CV LAB;  Service: Cardiovascular;  Laterality: N/A;  . SALPINGOOPHORECTOMY Right    [name] 07/03/2013  . TEE WITHOUT CARDIOVERSION N/A 06/07/2016   Procedure: TRANSESOPHAGEAL ECHOCARDIOGRAM (TEE);  Surgeon: Chrystie Nose, MD;  Location: [facility] ENDOSCOPY;  Service: Cardiovascular;  Laterality: N/A;  . TUBAL LIGATION      Prior to Admission medications   Medication Sig Start Date End Date Taking? Authorizing Provider  acetaminophen (TYLENOL) 500 MG tablet Take 500 mg by mouth every 6 (six) hours as needed for moderate pain or fever.    [provider]  albuterol (PROVENTIL) (2.5 MG/3ML) 0.083% nebulizer solution Take 2.5 mg by nebulization every 4 (four) hours as needed for wheezing or shortness of breath.    [provider]  amLODipine (NORVASC) 10 MG tablet Take 10 mg by mouth daily.     [provider]  aspirin 81 MG chewable tablet Chew 81 mg by mouth daily.    [provider]  atorvastatin (LIPITOR) 40 MG tablet Take 40 mg by mouth daily at 6 PM.    [provider]  benztropine (COGENTIN) 0.5 MG tablet Take 0.5 mg by mouth 2 (two) times daily.    [provider]  bisacodyl (DULCOLAX) 10 MG suppository Place 10 mg rectally as needed for moderate constipation.    [provider]  budesonide (PULMICORT) 0.5 MG/2ML nebulizer solution Take 2 mLs (0.5 mg total) by nebulization 2 (two) times daily. 08/30/17   Auburn Bilberry, MD  carboxymethylcellulose (REFRESH) 1 % ophthalmic solution Place 1 drop into both eyes 3 (three) times daily.    [provider]  clonazePAM (KLONOPIN) 0.5 MG tablet Take  tablet (0.25MG ) by mouth every morning and 1 tablet (0.5MG ) by mouth every evening    [provider]  clopidogrel  (PLAVIX) 75 MG tablet Take 75 mg by mouth daily at 12 noon.     [provider]  escitalopram (LEXAPRO) 5 MG tablet Take 2.5 mg by mouth daily.    [provider]  fluticasone (FLONASE) 50 MCG/ACT nasal spray Place 1 spray into both nostrils daily.     [provider]  gabapentin (NEURONTIN) 300 MG capsule Take 300 mg by mouth daily.     [provider]  hydrALAZINE (APRESOLINE) 50 MG tablet Take 1 tablet (50MG ) by mouth three times daily (Sunday, Tuesday, Thursday & Saturday) and take 1 tablet (50MG ) by mouth twice daily (Monday, Wednesday & Friday).    [provider]  HYDROcodone-acetaminophen (NORCO/VICODIN) 5-325 MG tablet Take 1 tablet by mouth every 6 (six) hours as needed for moderate pain or severe pain. 08/30/17   Auburn Bilberry, MD  insulin aspart (NOVOLOG) 100 UNIT/ML injection Inject 0-9 Units into the skin every 4 (four) hours. 08/30/17   Auburn Bilberry, MD  ipratropium-albuterol (DUONEB) 0.5-2.5 (3) MG/3ML SOLN Take 3 mLs by nebulization every 6 (six) hours. 08/30/17   Auburn Bilberry, MD  isosorbide mononitrate (IMDUR) 30 MG 24 hr tablet Take 90 mg by mouth daily at 12 noon.     [provider]  lactulose (CHRONULAC) 10 GM/15ML solution Take 10 g by mouth daily at 6 PM.     [provider]  levETIRAcetam (KEPPRA) 750 MG tablet Take 2 tablets (1,500 mg total) by mouth daily at 12 noon. 05/20/17   Delfino Lovett, MD  lidocaine (LIDODERM) 5 % Place 1 patch onto the skin at bedtime.     [provider]  lidocaine (LMX) 4 % cream Apply 1 application topically daily as needed (pain). To right arm fistula    [provider]  lisinopril (PRINIVIL,ZESTRIL) 10 MG tablet Take 10 mg by mouth daily at 12 noon.    [provider]  Melatonin 3 MG TABS Take 3 mg by mouth at bedtime.    [provider]  metoprolol (LOPRESSOR) 50 MG tablet Take 1 tablet (50 mg total) by mouth 2 (two) times daily. 09/15/15   Milagros Loll, MD  multivitamin (ONE-A-DAY MEN'S) TABS tablet Take 1 tablet by mouth daily.    [provider]  nitroGLYCERIN (NITROSTAT) 0.4 MG SL tablet Place 0.4 mg under the tongue every 5 (five) minutes as needed for chest pain. Reported on 07/10/2015    [provider]  omeprazole (PRILOSEC) 20 MG capsule Take 20 mg by mouth 2 (two) times daily.     [provider]  ondansetron (ZOFRAN ODT) 4 MG disintegrating tablet Take 1 tablet (4 mg total) by mouth  every 6 (six) hours as needed for nausea or vomiting. 07/16/15   Sharyn Creamer, MD  OXcarbazepine (TRILEPTAL) 150 MG tablet Take 150 mg by mouth at bedtime.    [provider]  polyethylene glycol (MIRALAX / GLYCOLAX) packet Take 17 g by mouth daily. Patient taking differently: Take 17 g by mouth daily at 12 noon.  08/02/15   Adrian Saran, MD  polyethylene glycol powder (GLYCOLAX/MIRALAX) powder 1 cap full in a full glass of water, two times a day for 3 days. 09/06/17   Sharman Cheek, MD  senna (SENOKOT) 8.6 MG TABS tablet Take 2 tablets by mouth 2 (two) times daily.     [provider]  senna-docusate (SENOKOT-S) 8.6-50 MG tablet Take 2 tablets by mouth 2 (two) times daily. 09/06/17   Sharman Cheek, MD  sevelamer carbonate (RENVELA) 800 MG tablet Take 800 mg by mouth 4 (four) times daily -  before meals and at bedtime.     [provider]  torsemide (DEMADEX) 100 MG tablet Take 100 mg by mouth daily.     [provider]  traZODone (DESYREL) 50 MG tablet Take 0.5 tablets (25 mg total) by mouth at bedtime as needed for sleep. 08/30/17   Auburn Bilberry, MD    Allergies Cephalosporins; Penicillins; Reglan [metoclopramide]; Risperdal [risperidone]; Lamictal [lamotrigine]; Phenergan [promethazine hcl]; Sulfamethoxazole-trimethoprim; Sulfasalazine; Pravastatin; and Sulfa antibiotics  Family History  Problem Relation Age of Onset  . CAD Unknown   . Diabetes Unknown   . Bipolar disorder Unknown     . Cervical cancer Mother   . Other Father        heart bypass  . Breast cancer Neg Hx     Social History Social History   Tobacco Use  . Smoking status: Former Smoker    Packs/day: 0.75    Types: Cigarettes    Last attempt to quit: 07/01/2015    Years since quitting: 2.4  . Smokeless tobacco: Never Used  Substance Use Topics  . Alcohol use: No    Alcohol/week: 0.0 standard drinks  . Drug use: No    Review of Systems  Constitutional: No fever/chills Eyes: No visual changes. ENT: No sore throat. Cardiovascular: Denies chest pain. Respiratory: Denies shortness of breath. Gastrointestinal: No abdominal pain.  No nausea, no vomiting.  No diarrhea.  No constipation. Genitourinary: Negative for dysuria. Musculoskeletal: Negative for back pain. Skin: Negative for rash. Neurological: As above   ____________________________________________   PHYSICAL EXAM:  VITAL SIGNS: ED Triage Vitals  Enc Vitals Group     BP 11/24/17 1112 (!) 181/82     Pulse Rate 11/24/17 1112 65     Resp 11/24/17 1112 20     Temp 11/24/17 1112 97.8 F (36.6 C)     Temp Source 11/24/17 1112 Oral     SpO2 11/24/17 1112 99 %     Weight 11/24/17 1114 250 lb (113.4 kg)     Height 11/24/17 1114 5\' 9"  (1.753 m)     Head Circumference --      Peak Flow --      Pain Score 11/24/17 1113 8     Pain Loc --      Pain Edu? --      Excl. in GC? --     Constitutional: Alert and oriented. Well appearing and in no acute distress. Eyes: Conjunctivae are normal.  Head: Atraumatic.  Tenderness to palpation to the occiput nor do I palpate any raised cephalohematoma. Nose: No congestion/rhinnorhea. Mouth/Throat: Mucous membranes  are moist.  Neck: No stridor.  No tenderness to palpation to the midline cervical spine.  No deformity or step-off. Cardiovascular: Normal rate, regular rhythm. Grossly normal heart sounds.  Good peripheral circulation with palpable thrill to the right upper extremity dialysis  fistula. Respiratory: Normal respiratory effort.  No retractions. Lungs CTAB. Gastrointestinal: Soft and nontender. No distention.  Musculoskeletal: Right BKA.   Neurologic: Stuttering speech with the leg with finding words.  No facial asymmetry.  I do not appreciate any limb weakness to the 4 extremities. Skin:  Skin is warm, dry and intact. No rash noted. Psychiatric: Mood and affect are normal. Speech and behavior are normal.  ____________________________________________   LABS (all labs ordered are listed, but only abnormal results are displayed)  Labs Reviewed  CBC - Abnormal; Notable for the following components:      Result Value   Hemoglobin 11.8 (*)    MCHC 31.9 (*)    RDW 16.6 (*)    All other components within normal limits  COMPREHENSIVE METABOLIC PANEL - Abnormal; Notable for the following components:   Chloride 97 (*)    Glucose, Bld 158 (*)    BUN 26 (*)    Creatinine, Ser 3.70 (*)    Calcium 8.6 (*)    Alkaline Phosphatase 153 (*)    GFR calc non Af Amer 13 (*)    GFR calc Af Amer 15 (*)    All other components within normal limits  TROPONIN I - Abnormal; Notable for the following components:   Troponin I 0.03 (*)    All other components within normal limits  PROTIME-INR  APTT  DIFFERENTIAL  CBG MONITORING, ED   ____________________________________________  EKG  ED ECG REPORT I, Arelia Longest, the attending physician, personally viewed and interpreted this ECG.   Date: 11/24/2017  EKG Time: 1125  Rate: 66  Rhythm: normal sinus rhythm  Axis: Normal  Intervals:none  ST&T Change: No ST segment elevation or depression.  No abnormal T wave inversion.  ____________________________________________  RADIOLOGY  CT head without any acute finding. ____________________________________________   PROCEDURES  Procedure(s) performed:   Procedures  Critical Care performed:   ____________________________________________   INITIAL IMPRESSION /  ASSESSMENT AND PLAN / ED COURSE  Pertinent labs & imaging results that were available during my care of the patient were reviewed by me and considered in my medical decision making (see chart for details).  Differential diagnosis includes, but is not limited to, intracranial hemorrhage, meningitis/encephalitis, previous head trauma, cavernous venous thrombosis, tension headache, temporal arteritis, migraine or migraine equivalent, idiopathic intracranial hypertension, and non-specific headache. As part of my medical decision making, I reviewed the following data within the electronic MEDICAL RECORD NUMBER Notes from prior ED visits  ----------------------------------------- 3:33 PM on 11/24/2017 -----------------------------------------  Patient pending MRI at this time.  Reassuring CAT scan.  I believe if the MRI is negative the patient likely has concussive symptoms and will be able to be discharged home.  Signed out to Dr. Alphonzo Lemmings. ____________________________________________   FINAL CLINICAL IMPRESSION(S) / ED DIAGNOSES  Aphasia.  NEW MEDICATIONS STARTED DURING THIS VISIT:  New Prescriptions   No medications on file     Note:  This document was prepared using Dragon voice recognition software and may include unintentional dictation errors.     Myrna Blazer, MD 11/24/17 1534    Pershing Proud, Myra Rude, MD 11/24/17 1535

## 2017-11-24 NOTE — ED Notes (Signed)
Patient transported to MRI 

## 2017-11-24 NOTE — ED Notes (Signed)
Pt family inquiring on MRI. This RN called MRI for updated time spoke with Jill Alexanders whom states that 30 mins, Family at bedside and aware. PT is resting and requesting food, RN explained that pt needed scan first.

## 2017-11-24 NOTE — ED Provider Notes (Addendum)
-----------------------------------------   6:16 PM on 11/24/2017 -----------------------------------------  Waiting results of MRI that was ordered by prior physician, patient had a fall couple days ago and bumped her head and since that time she is been having some postconcussive syndrome-like symptoms but MRI was ordered to ensure no other CVA or pathology was to.  According to signout from prior physician if MRI is negative patient is to go home.  Patient has been made aware of this fact.  We are awaiting MRI results.  ----------------------------------------- 9:58 PM on 11/24/2017 -----------------------------------------  MRI is normal, patient no acute distress at baseline speaking normally, her blood pressure somewhat elevated but she is due for her evening blood pressure medications we did offer to give them here, but she declines.  Everything else appears to be within normal limits, we will discharge according to prior patient's plan and patient's insistence, [facility].  Family and patient very much in agreement with this plan.   Jeanmarie Plant, MD 11/24/17 1816    Jeanmarie Plant, MD 11/24/17 2200

## 2017-11-24 NOTE — ED Notes (Signed)
.   Pt is resting, Respirations even and unlabored, NAD. Stretcher lowest postion and locked. Call bell within reach. Denies any needs at this time RN will continue to monitor.    

## 2017-11-24 NOTE — ED Notes (Signed)
Pt was resting, Oxygen sat dropped to 87% on RA. This RN placed pt on 2L [place]. Pt now has O2 sat at 98% while resting.

## 2017-11-24 NOTE — ED Triage Notes (Addendum)
Patient reports getting dizzy and falling on Friday, hitting her head. Patient states she is taking plavix. Since Friday, reports worsening headache as well as trouble finding her words. Patient slow to respond to questions in triage. Patient denies any LOC, N/V. Patient also complaining of pain in her chest but states she hit her chest when she fell on Friday.

## 2017-11-25 ENCOUNTER — Encounter (INDEPENDENT_AMBULATORY_CARE_PROVIDER_SITE_OTHER): Payer: Self-pay

## 2017-11-25 ENCOUNTER — Other Ambulatory Visit (INDEPENDENT_AMBULATORY_CARE_PROVIDER_SITE_OTHER): Payer: Self-pay | Admitting: Nurse Practitioner

## 2017-11-26 MED ORDER — CLINDAMYCIN PHOSPHATE 300 MG/50ML IV SOLN
300.0000 mg | Freq: Once | INTRAVENOUS | Status: AC
  Administered 2017-11-27: 300 mg via INTRAVENOUS

## 2017-11-27 ENCOUNTER — Ambulatory Visit (INDEPENDENT_AMBULATORY_CARE_PROVIDER_SITE_OTHER): Payer: Medicare HMO | Admitting: Nurse Practitioner

## 2017-11-27 ENCOUNTER — Encounter: Payer: Self-pay | Admitting: *Deleted

## 2017-11-27 ENCOUNTER — Encounter
Admission: RE | Disposition: A | Discharge status: Home / Self Care | Payer: Self-pay | Source: Ambulatory Visit | Attending: Vascular Surgery

## 2017-11-27 ENCOUNTER — Encounter (INDEPENDENT_AMBULATORY_CARE_PROVIDER_SITE_OTHER): Payer: Medicare HMO

## 2017-11-27 ENCOUNTER — Ambulatory Visit
Admission: RE | Admit: 2017-11-27 | Discharge: 2017-11-27 | Disposition: A | Discharge status: Home / Self Care | Payer: Medicare HMO | Source: Ambulatory Visit | Attending: Vascular Surgery | Admitting: Vascular Surgery

## 2017-11-27 DIAGNOSIS — I132 Hypertensive heart and chronic kidney disease with heart failure and with stage 5 chronic kidney disease, or end stage renal disease: Secondary | ICD-10-CM | POA: Insufficient documentation

## 2017-11-27 DIAGNOSIS — I2510 Atherosclerotic heart disease of native coronary artery without angina pectoris: Secondary | ICD-10-CM

## 2017-11-27 DIAGNOSIS — Z9889 Other specified postprocedural states: Secondary | ICD-10-CM | POA: Diagnosis not present

## 2017-11-27 DIAGNOSIS — E785 Hyperlipidemia, unspecified: Secondary | ICD-10-CM | POA: Diagnosis not present

## 2017-11-27 DIAGNOSIS — Z9842 Cataract extraction status, left eye: Secondary | ICD-10-CM | POA: Diagnosis not present

## 2017-11-27 DIAGNOSIS — E1122 Type 2 diabetes mellitus with diabetic chronic kidney disease: Secondary | ICD-10-CM | POA: Insufficient documentation

## 2017-11-27 DIAGNOSIS — T82868A Thrombosis of vascular prosthetic devices, implants and grafts, initial encounter: Secondary | ICD-10-CM

## 2017-11-27 DIAGNOSIS — Y832 Surgical operation with anastomosis, bypass or graft as the cause of abnormal reaction of the patient, or of later complication, without mention of misadventure at the time of the procedure: Secondary | ICD-10-CM | POA: Insufficient documentation

## 2017-11-27 DIAGNOSIS — Z90711 Acquired absence of uterus with remaining cervical stump: Secondary | ICD-10-CM | POA: Insufficient documentation

## 2017-11-27 DIAGNOSIS — Z87891 Personal history of nicotine dependence: Secondary | ICD-10-CM | POA: Diagnosis not present

## 2017-11-27 DIAGNOSIS — I509 Heart failure, unspecified: Secondary | ICD-10-CM | POA: Insufficient documentation

## 2017-11-27 DIAGNOSIS — Z888 Allergy status to other drugs, medicaments and biological substances status: Secondary | ICD-10-CM | POA: Insufficient documentation

## 2017-11-27 DIAGNOSIS — Z955 Presence of coronary angioplasty implant and graft: Secondary | ICD-10-CM | POA: Insufficient documentation

## 2017-11-27 DIAGNOSIS — N186 End stage renal disease: Secondary | ICD-10-CM

## 2017-11-27 DIAGNOSIS — T82858A Stenosis of vascular prosthetic devices, implants and grafts, initial encounter: Secondary | ICD-10-CM | POA: Insufficient documentation

## 2017-11-27 DIAGNOSIS — Z89511 Acquired absence of right leg below knee: Secondary | ICD-10-CM | POA: Insufficient documentation

## 2017-11-27 DIAGNOSIS — Z992 Dependence on renal dialysis: Secondary | ICD-10-CM | POA: Diagnosis not present

## 2017-11-27 DIAGNOSIS — Z880 Allergy status to penicillin: Secondary | ICD-10-CM | POA: Diagnosis not present

## 2017-11-27 DIAGNOSIS — Z9841 Cataract extraction status, right eye: Secondary | ICD-10-CM | POA: Diagnosis not present

## 2017-11-27 DIAGNOSIS — Z8673 Personal history of transient ischemic attack (TIA), and cerebral infarction without residual deficits: Secondary | ICD-10-CM | POA: Diagnosis not present

## 2017-11-27 DIAGNOSIS — Z8249 Family history of ischemic heart disease and other diseases of the circulatory system: Secondary | ICD-10-CM | POA: Diagnosis not present

## 2017-11-27 DIAGNOSIS — I252 Old myocardial infarction: Secondary | ICD-10-CM | POA: Diagnosis not present

## 2017-11-27 DIAGNOSIS — E1143 Type 2 diabetes mellitus with diabetic autonomic (poly)neuropathy: Secondary | ICD-10-CM | POA: Diagnosis not present

## 2017-11-27 DIAGNOSIS — Z882 Allergy status to sulfonamides status: Secondary | ICD-10-CM | POA: Diagnosis not present

## 2017-11-27 DIAGNOSIS — E11649 Type 2 diabetes mellitus with hypoglycemia without coma: Secondary | ICD-10-CM | POA: Diagnosis not present

## 2017-11-27 DIAGNOSIS — Z881 Allergy status to other antibiotic agents status: Secondary | ICD-10-CM | POA: Diagnosis not present

## 2017-11-27 DIAGNOSIS — I10 Essential (primary) hypertension: Secondary | ICD-10-CM

## 2017-11-27 HISTORY — PX: A/V FISTULAGRAM: CATH118298

## 2017-11-27 LAB — POTASSIUM (ARMC VASCULAR LAB ONLY): Potassium (ARMC vascular lab): 4.5 (ref 3.5–5.1)

## 2017-11-27 SURGERY — A/V FISTULAGRAM
Anesthesia: Moderate Sedation | Laterality: Left

## 2017-11-27 MED ORDER — HEPARIN (PORCINE) IN NACL 1000-0.9 UT/500ML-% IV SOLN
INTRAVENOUS | Status: AC
  Filled 2017-11-27: qty 1000

## 2017-11-27 MED ORDER — FENTANYL CITRATE (PF) 100 MCG/2ML IJ SOLN
INTRAMUSCULAR | Status: DC | PRN
  Administered 2017-11-27 (×2): 50 ug via INTRAVENOUS

## 2017-11-27 MED ORDER — HYDROMORPHONE HCL 1 MG/ML IJ SOLN: 1.0000 mg | Freq: Once | INTRAMUSCULAR | Status: DC | PRN

## 2017-11-27 MED ORDER — HEPARIN SODIUM (PORCINE) 1000 UNIT/ML IJ SOLN
INTRAMUSCULAR | Status: AC
  Filled 2017-11-27: qty 1

## 2017-11-27 MED ORDER — MIDAZOLAM HCL 2 MG/2ML IJ SOLN
INTRAMUSCULAR | Status: DC | PRN
  Administered 2017-11-27: 2 mg via INTRAVENOUS
  Administered 2017-11-27: 1 mg via INTRAVENOUS
  Administered 2017-11-27: 2 mg via INTRAVENOUS

## 2017-11-27 MED ORDER — SODIUM CHLORIDE 0.9 % IV SOLN
INTRAVENOUS | Status: DC
  Administered 2017-11-27: 09:00:00 via INTRAVENOUS

## 2017-11-27 MED ORDER — HEPARIN SODIUM (PORCINE) 1000 UNIT/ML IJ SOLN
INTRAMUSCULAR | Status: DC | PRN
  Administered 2017-11-27: 3000 [IU] via INTRAVENOUS

## 2017-11-27 MED ORDER — IOPAMIDOL (ISOVUE-300) INJECTION 61%
INTRAVENOUS | Status: DC | PRN
  Administered 2017-11-27: 35 mL via INTRAVENOUS

## 2017-11-27 MED ORDER — CLINDAMYCIN PHOSPHATE 300 MG/50ML IV SOLN
INTRAVENOUS | Status: AC
  Administered 2017-11-27: 300 mg via INTRAVENOUS
  Filled 2017-11-27: qty 50

## 2017-11-27 MED ORDER — MIDAZOLAM HCL 5 MG/5ML IJ SOLN
INTRAMUSCULAR | Status: AC
  Filled 2017-11-27: qty 5

## 2017-11-27 MED ORDER — FENTANYL CITRATE (PF) 100 MCG/2ML IJ SOLN
INTRAMUSCULAR | Status: AC
  Filled 2017-11-27: qty 2

## 2017-11-27 MED ORDER — ONDANSETRON HCL 4 MG/2ML IJ SOLN: 4.0000 mg | Freq: Four times a day (QID) | INTRAMUSCULAR | Status: DC | PRN

## 2017-11-27 MED ORDER — LIDOCAINE-EPINEPHRINE (PF) 1 %-1:200000 IJ SOLN
INTRAMUSCULAR | Status: AC
  Filled 2017-11-27: qty 30

## 2017-11-27 SURGICAL SUPPLY — 18 items
BALLN DORADO 10X40X80 (BALLOONS) ×3
BALLN DORADO 7X80X80 (BALLOONS) ×3
BALLN DORADO 8X100X80 (BALLOONS) ×3
BALLN LUTONIX 7X80X130 (BALLOONS) ×3
BALLOON DORADO 10X40X80 (BALLOONS) ×1 IMPLANT
BALLOON DORADO 7X80X80 (BALLOONS) ×1 IMPLANT
BALLOON DORADO 8X100X80 (BALLOONS) ×1 IMPLANT
BALLOON LUTONIX 7X80X130 (BALLOONS) ×1 IMPLANT
CANNULA 5F STIFF (CANNULA) ×3 IMPLANT
CATH BEACON 5 .035 65 KMP TIP (CATHETERS) ×3 IMPLANT
DEVICE PRESTO INFLATION (MISCELLANEOUS) ×3 IMPLANT
PACK ANGIOGRAPHY (CUSTOM PROCEDURE TRAY) ×3 IMPLANT
SHEATH BRITE TIP 6FRX5.5 (SHEATH) ×3 IMPLANT
SHEATH BRITE TIP 7FRX5.5 (SHEATH) ×3 IMPLANT
STENT VIABAHN 8X7.5X120 (Permanent Stent) ×3 IMPLANT
SUT MNCRL AB 4-0 PS2 18 (SUTURE) ×3 IMPLANT
WIRE G 018X200 V18 (WIRE) ×3 IMPLANT
WIRE MAGIC TORQUE 260C (WIRE) ×3 IMPLANT

## 2017-11-27 NOTE — Op Note (Signed)
[facility]    OPERATIVE NOTE   PROCEDURE: 1.   Right brachiocephalic arteriovenous fistula cannulation under ultrasound guidance 2.   Right arm fistulagram including central venogram 3.   Percutaneous transluminal angioplasty of mid upper arm cephalic vein with 10 mm diameter by 4 cm length high-pressure angioplasty balloon 4.   Percutaneous transluminal angioplasty of the right cephalic vein subclavian vein confluence with 7 mm diameter Lutonix drug-coated and high-pressure angioplasty balloons 5.   Viabahn stent placement to the right cephalic vein subclavian vein confluence with 8 mm diameter by 7.5 cm length covered stent for high-grade residual stenosis after angioplasty  PRE-OPERATIVE DIAGNOSIS: 1. ESRD 2. Poorly functional right brachiocephalic AVF  POST-OPERATIVE DIAGNOSIS: same as above   SURGEON: Leotis Pain, MD  ANESTHESIA: local with MCS  ESTIMATED BLOOD LOSS: 10 cc  FINDING(S): 1. Somewhat aneurysmal right brachiocephalic AV fistula with a moderate stenosis in the 50 to 60% range in the mid upper arm cephalic vein with several collaterals in this area.  The cephalic vein subclavian vein confluence then had a very high-grade stenosis in the 90% range.  The right innominate vein and superior vena cava were widely patent.  SPECIMEN(S):  None  CONTRAST: 35 cc  FLUORO TIME: 2.9 minutes  MODERATE CONSCIOUS SEDATION TIME: Approximately 25 minutes with 5 mg of Versed and 100 Mcg of Fentanyl   INDICATIONS: Kimberly Montgomery is a 52 y.o. 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  The patient is scheduled for right arm fistulagram.  The patient is aware the risks include but are not limited to: bleeding, infection, thrombosis of the cannulated access, and possible anaphylactic reaction to the contrast.  The patient is aware of the risks of the procedure and elects to proceed  forward.  DESCRIPTION: After full informed written consent was obtained, the patient was brought back to the angiography suite and placed supine upon the angiography table.  The patient was connected to monitoring equipment. Moderate conscious sedation was administered with a face to face encounter with the patient throughout the procedure with my supervision of the RN administering medicines and monitoring the patient's vital signs and mental status throughout from the start of the procedure until the patient was taken to the recovery room. The right arm was prepped and draped in the standard fashion for a percutaneous access intervention.  Under ultrasound guidance, the right brachiocephalic arteriovenous fistula was cannulated with a micropuncture needle under direct ultrasound guidance and a permanent image was performed.  The microwire was advanced into the fistula and the needle was exchanged for the a microsheath.  I then upsized to a 6 Fr Sheath and imaging was performed.  Hand injections were completed to image the access including the central venous system. This demonstrated a somewhat aneurysmal right brachiocephalic AV fistula with a moderate stenosis in the 50 to 60% range in the mid upper arm cephalic vein with several collaterals in this area.  The cephalic vein subclavian vein confluence then had a very high-grade stenosis in the 90% range.  The right innominate vein and superior vena cava were widely patent. Based on the images, this patient will need intervention to improve the fistula. I then gave the patient 3000 units of intravenous heparin.  I then crossed the stenoses with a Magic Tourqe wire.  Based on the imaging, a 7 mm x 8 cm Lutonix drug-coated angioplasty balloon was selected.  The balloon was centered around the cephalic vein subclavian vein  confluence with the most proximal centimeter or 2 of the balloon in the subclavian vein stenosis and inflated to 14 ATM for 1 minute(s) but  the waist did not break.  I then exchanged for a high-pressure 7 mm diameter angioplasty balloon and at 16 to 18 atm the waist resolved.  This was held for 1 minute.  Completion angiogram still showed a high-grade stenosis of greater than 80%.  I then exchanged for a 7 French sheath and a 0.018 wire.  An 8 mm diameter by 7.5 cm length Viabahn stent was then deployed across the cephalic vein subclavian vein confluence with the most proximal centimeter or so of the stent into the subclavian vein.  This was postdilated with an 8 mm diameter high-pressure angioplasty balloon.  On completion imaging, a 10 % residual stenosis was present.   With a moderate stenosis in the upper arm cephalic vein the fistula was quite aneurysmal around this.  A 10 mm diameter by 4 cm length high-pressure angioplasty balloon was inflated to 12 atm for 1 minute.  Completion imaging showed about a 30 to 40% residual stenosis but I did not want to balloon any larger in this location and felt this would not harm the flow or patency of the fistula.  Based on the completion imaging, no further intervention is necessary.  The wire and balloon were removed from the sheath.  A 4-0 Monocryl purse-string suture was sewn around the sheath.  The sheath was removed while tying down the suture.  A sterile bandage was applied to the puncture site.  COMPLICATIONS: None  CONDITION: Stable   Leotis Pain  11/27/2017 9:56 AM   This note was created with Dragon Medical transcription system. Any errors in dictation are purely unintentional.

## 2017-11-27 NOTE — H&P (Addendum)
[facility] Admission History & Physical  MRN : [id number]  Kimberly Montgomery is a 52 y.o. ([birth year]) female who presents with chief complaint of No chief complaint on file. [name]  History of Present Illness: I am asked to evaluate the patient by the dialysis center. The patient was sent here because they were unable to achieve adequate dialysis this morning. Furthermore the Center states there is very poor thrill and bruit. The patient states there there have been increasing problems with the access, such as "pulling clots" during dialysis and prolonged bleeding after decannulation. The patient estimates these problems have been going on for several weeks. The patient is unaware of any other change.  Patient denies pain or tenderness overlying the access.  There is no pain with dialysis.  The patient denies hand pain or finger pain consistent with steal syndrome.   There have not been any recent past interventions or declots of this access.  The patient is not chronically hypotensive on dialysis.  Current Facility-Administered Medications  Medication Dose Route Frequency Provider Last Rate Last Dose  . 0.9 %  sodium chloride infusion   Intravenous Continuous Georgiana Spinner, NP      . clindamycin (CLEOCIN) IVPB 300 mg  300 mg Intravenous Once Sheppard Plumber E, NP      . HYDROmorphone (DILAUDID) injection 1 mg  1 mg Intravenous Once PRN Georgiana Spinner, NP      . ondansetron [facility]) injection 4 mg  4 mg Intravenous Q6H PRN Georgiana Spinner, NP        Past Medical History:  Diagnosis Date  . Anginal pain (HCC)   . Anxiety   . Bipolar disorder (HCC)   . CAD (coronary artery disease)   . CHF (congestive heart failure) (HCC)   . Chronic lower back pain   . Depression   . Endometriosis   . ESRD (end stage renal disease) on dialysis (HCC)    "[facility]; [address]; [place]; TTS" (01/19/2016)  . Gastroparesis   . GERD (gastroesophageal reflux disease)   . Heart  attack (HCC) 08/2017   stent placed, [facility]  . History of blood transfusion "several"   "my blood would get low; low RBC"  . History of hiatal hernia   . HLD (hyperlipidemia)   . Hypertension   . Migraine    "monthly" (01/19/2016)  . Myocardial infarction (HCC) 2017   "~ 3 wks ago" (01/19/2016)  . Renal disorder   . Renal insufficiency   . Seizures (HCC) 07/2015   "I've only had the 1; don't know what from" (01/19/2016)  . Stroke (HCC)   . Type II diabetes mellitus (HCC)     Past Surgical History:  Procedure Laterality Date  . ABDOMINAL HYSTERECTOMY     "partial"  . AV FISTULA PLACEMENT Right 08/23/2016   Procedure: ARTERIOVENOUS (AV) FISTULA CREATION  ( BRACHIAL CEPHALIC );  Surgeon: Renford Dills, MD;  Location: [facility] ORS;  Service: Vascular;  Laterality: Right;  . BELOW KNEE LEG AMPUTATION Right 2010?  [name] CARDIAC CATHETERIZATION Right 07/10/2015   Procedure: Left Heart Cath and Coronary Angiography;  Surgeon: Laurier Nancy, MD;  Location: [facility] INVASIVE CV LAB;  Service: Cardiovascular;  Laterality: Right;  . CARDIAC CATHETERIZATION Right 09/14/2015   Procedure: Left Heart Cath and Coronary Angiography;  Surgeon: Laurier Nancy, MD;  Location: [facility] INVASIVE [facility];  Service: Cardiovascular;  Laterality: Right;  . CARDIAC CATHETERIZATION N/A 09/14/2015   Procedure: Coronary Stent Intervention;  Surgeon: Alwyn Pea, MD;  Location: [facility] INVASIVE CV LAB;  Service: Cardiovascular;  Laterality: N/A;  . CARDIAC CATHETERIZATION Right 11/20/2015   Procedure: Left Heart Cath and Coronary Angiography;  Surgeon: Laurier Nancy, MD;  Location: [facility];  Service: Cardiovascular;  Laterality: Right;  . CATARACT EXTRACTION, BILATERAL    . CORONARY ANGIOPLASTY WITH STENT PLACEMENT  <2017   @ [facility]/notes 07/03/2013  . CORONARY ANGIOPLASTY WITH STENT PLACEMENT    . CORONARY STENT INTERVENTION N/A 08/28/2017   Procedure: CORONARY STENT INTERVENTION;  Surgeon: Alwyn Pea, MD;  Location: [facility];  Service: Cardiovascular;  Laterality: N/A;  . DIALYSIS/PERMA CATHETER REMOVAL N/A 11/12/2016   Procedure: DIALYSIS/PERMA CATHETER REMOVAL;  Surgeon: Renford Dills, MD;  Location: [facility] IN[facility];  Service: Cardiovascular;  Laterality: N/A;  . HYSTEROTOMY    . PERIPHERAL VASCULAR CATHETERIZATION N/A 08/30/2015   Procedure: Dialysis/Perma Catheter Insertion;  Surgeon: Annice Needy, MD;  Location: [facility];  Service: Cardiovascular;  Laterality: N/A;  . PERIPHERAL VASCULAR CATHETERIZATION N/A 01/01/2016   Procedure: Dialysis/Perma Catheter Insertion;  Surgeon: Annice Needy, MD;  Location: [facility];  Service: Cardiovascular;  Laterality: N/A;  . PERITONEAL CATHETER INSERTION  11/03/2013   [name] 11/03/2013  . PERITONEAL CATHETER REMOVAL  01/01/2016   "took the one from May out; put new PD cath in" (01/19/2016)  . PERITONEAL CATHETER REMOVAL  11/03/2013; 02/11/2014   [name] 11/03/2013; Removal of tunneled catheter/notes 02/11/2014  . RIGHT/LEFT HEART CATH AND CORONARY ANGIOGRAPHY N/A 08/28/2017   Procedure: RIGHT/LEFT HEART CATH AND CORONARY ANGIOGRAPHY;  Surgeon: Laurier Nancy, MD;  Location: [facility] IN[facility];  Service: Cardiovascular;  Laterality: N/A;  . SALPINGOOPHORECTOMY Right    [name] 07/03/2013  . TEE WITHOUT CARDIOVERSION N/A 06/07/2016   Procedure: TRANSESOPHAGEAL ECHOCARDIOGRAM (TEE);  Surgeon: Chrystie Nose, MD;  Location: [facility] ENDOSCOPY;  Service: Cardiovascular;  Laterality: N/A;  . TUBAL LIGATION      Social History Social History   Tobacco Use  . Smoking status: Former Smoker    Packs/day: 0.75    Types: Cigarettes    Last attempt to quit: 07/01/2015    Years since quitting: 2.4  . Smokeless tobacco: Never Used  Substance Use Topics  . Alcohol use: No    Alcohol/week: 0.0 standard drinks  . Drug use: No    Family History Family History  Problem Relation Age of Onset  . CAD Unknown   .  Diabetes Unknown   . Bipolar disorder Unknown   . Cervical cancer Mother   . Other Father        heart bypass  . Breast cancer Neg Hx     No family history of bleeding or clotting disorders, autoimmune disease or porphyria  Allergies  Allergen Reactions  . Cephalosporins Anaphylaxis    Patient has tolerated meropenem.   [name] Penicillins Anaphylaxis and Other (See Comments)    Has patient had a PCN reaction causing immediate rash, facial/tongue/throat swelling, SOB or lightheadedness with hypotension: Yes Has patient had a PCN reaction causing severe rash involving mucus membranes or skin necrosis: No Has patient had a PCN reaction that required hospitalization No Has patient had a PCN reaction occurring within the last 10 years: No If all of the above answers are "NO", then may proceed with Cephalosporin use.  . Reglan [Metoclopramide] Other (See Comments)    Severe EPS (lip, head, body tremors) March 2018  . Risperdal [Risperidone]  Other (See Comments)    Severe EPS (lip, head, body tremors) March 2018  . Lamictal [Lamotrigine] Other (See Comments)    Reaction:  Hallucinations  . Phenergan [Promethazine Hcl] Nausea And Vomiting    Sensitivity to medicine  . Sulfamethoxazole-Trimethoprim     Other reaction(s): hyperkalemia  . Sulfasalazine Other (See Comments)    Reaction:  Unknown   . Pravastatin Other (See Comments)    Reaction:  Muscle pain   . Sulfa Antibiotics Other (See Comments)    Reaction:  Unknown      REVIEW OF SYSTEMS (Negative unless checked)  Constitutional: [] Weight loss  [] Fever  [] Chills Cardiac: [] Chest pain   [] Chest pressure   [] Palpitations   [] Shortness of breath when laying flat   [] Shortness of breath at rest   [x] Shortness of breath with exertion. Vascular:  [] Pain in legs with walking   [] Pain in legs at rest   [] Pain in legs when laying flat   [] Claudication   [] Pain in feet when walking  [x] Pain in feet at rest  [] Pain in feet when laying flat    [] History of DVT   [] Phlebitis   [] Swelling in legs   [] Varicose veins   [] Non-healing ulcers Pulmonary:   [] Uses home oxygen   [] Productive cough   [] Hemoptysis   [] Wheeze  [] COPD   [] Asthma Neurologic:  [] Dizziness  [] Blackouts   [] Seizures   [] History of stroke   [] History of TIA  [] Aphasia   [] Temporary blindness   [] Dysphagia   [] Weakness or numbness in arms   [] Weakness or numbness in legs Musculoskeletal:  [x] Arthritis   [] Joint swelling   [] Joint pain   [] Low back pain Hematologic:  [] Easy bruising  [] Easy bleeding   [] Hypercoagulable state   [x] Anemic  [] Hepatitis Gastrointestinal:  [] Blood in stool   [] Vomiting blood  [x] Gastroesophageal reflux/heartburn   [] Difficulty swallowing. Genitourinary:  [x] Chronic kidney disease   [] Difficult urination  [] Frequent urination  [] Burning with urination   [] Blood in urine Skin:  [] Rashes   [] Ulcers   [] Wounds Psychological:  [x] History of anxiety   [x]  History of major depression.  Physical Examination  Vitals:   11/27/17 0814  BP: (!) 201/76  Pulse: 60  Resp: 19  Temp: 97.7 F (36.5 C)  TempSrc: Oral  SpO2: 99%  Weight: 113 kg  Height: 5\' 9"  (1.753 m)   Body mass index is 36.79 kg/m. Gen: WD/WN, NAD. Appears older than stated age. Head: [place]/AT, No temporalis wasting. Ear/Nose/Throat: Hearing grossly intact, nares w/o erythema or drainage, oropharynx w/o Erythema/Exudate,  Eyes: Conjunctiva clear, sclera non-icteric Neck: Trachea midline.  No JVD.  Pulmonary:  Good air movement, respirations not labored, no use of accessory muscles.  Cardiac: RRR, normal S1, S2. Vascular: pulsatile right arm AVF Vessel Right Left  Radial Palpable Palpable               Musculoskeletal: M/S 5/5 throughout. Right BKA Neurologic: Sensation grossly intact in extremities.  Symmetrical.  Speech is fluent. Motor exam as listed above. Psychiatric: Judgment intact, Mood & affect appropriate for pt's clinical situation. Dermatologic: No rashes or  ulcers noted.  No cellulitis or open wounds.    CBC Lab Results  Component Value Date   WBC 5.0 11/24/2017   HGB 11.8 (L) 11/24/2017   HCT 37.1 11/24/2017   MCV 86.0 11/24/2017   PLT 154 11/24/2017    BMET    Component Value Date/Time   NA 138 11/24/2017 1202   NA 136 (A) 03/27/2016  NA 136 07/17/2014 0417   K 3.8 11/24/2017 1202   K 3.5 07/17/2014 0417   CL 97 (L) 11/24/2017 1202   CL 103 07/17/2014 0417   CO2 32 11/24/2017 1202   CO2 26 07/17/2014 0417   GLUCOSE 158 (H) 11/24/2017 1202   GLUCOSE 221 (H) 07/17/2014 0417   BUN 26 (H) 11/24/2017 1202   BUN 46 (A) 03/27/2016   BUN 32 (H) 07/17/2014 0417   CREATININE 3.70 (H) 11/24/2017 1202   CREATININE 3.45 (H) 07/17/2014 0417   CALCIUM 8.6 (L) 11/24/2017 1202   CALCIUM 8.4 (L) 07/17/2014 0417   GFRNONAA 13 (L) 11/24/2017 1202   GFRNONAA 15 (L) 07/17/2014 0417   GFRAA 15 (L) 11/24/2017 1202   GFRAA 17 (L) 07/17/2014 0417   Estimated Creatinine Clearance: 23.3 mL/min (A) (by C-G formula based on SCr of 3.7 mg/dL (H)).  COAG Lab Results  Component Value Date   INR 1.02 11/24/2017   INR 1.00 08/25/2017   INR 0.97 08/19/2016    Radiology Ct Head Wo Contrast  Result Date: 11/24/2017 CLINICAL DATA:  Dizziness. Fall on Friday hitting head. Patient takes Plavix. EXAM: CT HEAD WITHOUT CONTRAST TECHNIQUE: Contiguous axial images were obtained from the base of the skull through the vertex without intravenous contrast. COMPARISON:  MR 08/22/2017 FINDINGS: Brain: No evidence of acute infarction, hemorrhage, hydrocephalus, extra-axial collection or mass lesion/mass effect. There is mild diffuse low-attenuation within the subcortical and periventricular white matter compatible with chronic microvascular disease. Prominence of the sulci and ventricles identified Vascular: No hyperdense vessel or unexpected calcification. Skull: Normal. Negative for fracture or focal lesion. Sinuses/Orbits: No acute finding. Other: None.  IMPRESSION: 1. No acute intracranial abnormalities. Mild small vessel ischemic changes and brain atrophy. Electronically Signed   By: Signa Kell M.D.   On: 11/24/2017 11:44   Mr Brain Wo Contrast  Result Date: 11/24/2017 CLINICAL DATA:  Stuttering speech which began earlier today. History of hypertension and diabetes. EXAM: MRI HEAD WITHOUT CONTRAST TECHNIQUE: Multiplanar, multiecho pulse sequences of the brain and surrounding structures were obtained without intravenous contrast. COMPARISON:  CT head 11/24/2017.  MR head 08/22/2017. FINDINGS: Brain: No evidence for acute infarction, hemorrhage, mass lesion, hydrocephalus, or extra-axial fluid. Premature for age cerebral and cerebellar atrophy. Mild subcortical and periventricular T2 and FLAIR hyperintensities, likely chronic microvascular ischemic change. Asymmetric mineralization LEFT basal ganglia. Vascular: Flow voids are maintained throughout the carotid, basilar, and vertebral arteries. There are no areas of chronic hemorrhage. Skull and upper cervical spine: Unremarkable visualized calvarium, skullbase, and cervical vertebrae. Pituitary, pineal, cerebellar tonsils unremarkable. No upper cervical cord lesions. Sinuses/Orbits: No orbital masses or proptosis. Globes appear symmetric. Sinuses appear well aerated, without evidence for air-fluid level. There may be BILATERAL optic atrophy. BILATERAL cataract extractions noted. Other: No nasopharyngeal pathology or mastoid fluid. Scalp and other visualized extracranial soft tissues grossly unremarkable. IMPRESSION: Premature for age atrophy. Minor small vessel disease. No acute intracranial findings. Similar appearance to priors. Electronically Signed   By: Elsie Stain M.D.   On: 11/24/2017 19:34    Assessment/Plan 1.  Complication dialysis device with poor function of AV access:  Patient's right arm dialysis access is malfunctioning. The patient will undergo angiography and correction of any problems  using interventional techniques with the hope of restoring function to the access.  The risks and benefits were described to the patient.  All questions were answered.  The patient agrees to proceed with angiography and intervention. Potassium will be drawn to ensure that it  is an appropriate level prior to performing intervention. 2.  End-stage renal disease requiring hemodialysis:  Patient will continue dialysis therapy without further interruption if a successful intervention is not achieved then a tunneled catheter will be placed. Dialysis has already been arranged. 3.  Hypertension:  Patient will continue medical management; nephrology is following no changes in oral medications. 4.  Diabetes mellitus:  Glucose will be monitored and oral medications been held this morning once the patient has undergone the patient's procedure po intake will be reinitiated and again Accu-Cheks will be used to assess the blood glucose level and treat as needed. The patient will be restarted on the patient's usual hypoglycemic regime 5.  Coronary artery disease:  EKG will be monitored. Nitrates will be used if needed. The patient's oral cardiac medications will be continued.    Festus Barren, MD  11/27/2017 8:24 AM

## 2017-11-28 ENCOUNTER — Encounter: Payer: Self-pay | Admitting: Vascular Surgery

## 2017-12-02 ENCOUNTER — Other Ambulatory Visit: Payer: Self-pay | Admitting: General Surgery

## 2017-12-02 DIAGNOSIS — N6489 Other specified disorders of breast: Secondary | ICD-10-CM

## 2017-12-10 ENCOUNTER — Ambulatory Visit (HOSPITAL_COMMUNITY): Payer: Medicare HMO

## 2017-12-16 ENCOUNTER — Encounter (HOSPITAL_COMMUNITY): Payer: Self-pay

## 2017-12-16 ENCOUNTER — Ambulatory Visit (HOSPITAL_COMMUNITY)
Admission: RE | Admit: 2017-12-16 | Discharge: 2017-12-16 | Disposition: A | Discharge status: Home / Self Care | Payer: Medicare HMO | Source: Ambulatory Visit | Attending: General Surgery | Admitting: General Surgery

## 2017-12-16 DIAGNOSIS — N6489 Other specified disorders of breast: Secondary | ICD-10-CM

## 2017-12-16 NOTE — Progress Notes (Signed)
Pt came to [facility] for a breast MRI. Pt is unable to lat on her stomach for more then about 2 mins. Pt has to be able to climb on to MRI table and lay face down for . Pt is currently unable to do this. All avenues were exhausted in the efforts to scan the pt. Pt can not breathe when laying face down.

## 2018-01-08 ENCOUNTER — Ambulatory Visit (INDEPENDENT_AMBULATORY_CARE_PROVIDER_SITE_OTHER): Payer: Medicare HMO | Admitting: Nurse Practitioner

## 2018-01-08 ENCOUNTER — Encounter (INDEPENDENT_AMBULATORY_CARE_PROVIDER_SITE_OTHER): Payer: Self-pay | Admitting: Nurse Practitioner

## 2018-01-08 ENCOUNTER — Ambulatory Visit (INDEPENDENT_AMBULATORY_CARE_PROVIDER_SITE_OTHER): Payer: Medicare HMO

## 2018-01-08 VITALS — BP 155/67 | HR 54 | Resp 18 | Ht 67.0 in | Wt 240.0 lb

## 2018-01-08 DIAGNOSIS — Z992 Dependence on renal dialysis: Secondary | ICD-10-CM

## 2018-01-08 DIAGNOSIS — Z87891 Personal history of nicotine dependence: Secondary | ICD-10-CM

## 2018-01-08 DIAGNOSIS — I120 Hypertensive chronic kidney disease with stage 5 chronic kidney disease or end stage renal disease: Secondary | ICD-10-CM

## 2018-01-08 DIAGNOSIS — E782 Mixed hyperlipidemia: Secondary | ICD-10-CM | POA: Diagnosis not present

## 2018-01-08 DIAGNOSIS — K219 Gastro-esophageal reflux disease without esophagitis: Secondary | ICD-10-CM

## 2018-01-08 DIAGNOSIS — N186 End stage renal disease: Secondary | ICD-10-CM

## 2018-01-08 DIAGNOSIS — T829XXS Unspecified complication of cardiac and vascular prosthetic device, implant and graft, sequela: Secondary | ICD-10-CM

## 2018-01-08 DIAGNOSIS — I10 Essential (primary) hypertension: Secondary | ICD-10-CM

## 2018-01-09 ENCOUNTER — Encounter (INDEPENDENT_AMBULATORY_CARE_PROVIDER_SITE_OTHER): Payer: Self-pay | Admitting: Nurse Practitioner

## 2018-01-09 NOTE — Progress Notes (Signed)
Subjective:    Patient ID: Kimberly Montgomery, female    DOB: July 10, 1965, 52 y.o.   MRN: [id number] Chief Complaint  Patient presents with  . Follow-up    6-8 week HDA    HPI  Kimberly Montgomery is a 52 y.o. female  The patient returns to the office for followup status post intervention of the dialysis access on 11/25/2017, where a stent was placed in the confluence.  Following the intervention the access function has significantly improved, with better flow rates and improved KT/V. The patient has not been experiencing increased bleeding times following decannulation and the patient denies increased recirculation. The patient denies an increase in arm swelling. At the present time the patient denies hand pain.  The patient denies amaurosis fugax or recent TIA symptoms. There are no recent neurological changes noted. The patient denies claudication symptoms or rest pain symptoms. The patient denies history of DVT, PE or superficial thrombophlebitis. The patient denies recent episodes of angina or shortness of breath.   Today Kimberly Montgomery underwent a hemodialysis access duplex which revealed a patent right brachiocephalic AV fistula with a patent complete stent and no evidence of stenosis.  The flow volume is 2352.  The celiac vein is tortuous and dilated in several locations throughout.  The losses are improved compared to previous study on 04/03/2017.    Review of Systems   Review of Systems: Negative Unless Checked Constitutional: [] Weight loss  [] Fever  [] Chills Cardiac: [] Chest pain   ? Atrial Fibrillation  [] Palpitations   [] Shortness of breath when laying flat   [] Shortness of breath with exertion. Vascular:  [] Pain in legs with walking   [] Pain in legs with standing  [] History of DVT   [] Phlebitis   [] Swelling in legs   [] Varicose veins   [] Non-healing ulcers Pulmonary:   [] Uses home oxygen   [] Productive cough   [] Hemoptysis   [] Wheeze  [] COPD   [] Asthma Neurologic:  [] Dizziness    [x] Seizures   [] History of stroke   [] History of TIA  [] Aphasia   [] Vissual changes   [] Weakness or numbness in arm   [] Weakness or numbness in leg Musculoskeletal:   [] Joint swelling   [] Joint pain   [] Low back pain  ? History of Knee Replacement Hematologic:  [] Easy bruising  [] Easy bleeding   [] Hypercoagulable state   [x] Anemic Gastrointestinal:  [] Diarrhea   [] Vomiting  [] Gastroesophageal reflux/heartburn   [] Difficulty swallowing. Genitourinary:  [x] Chronic kidney disease   [] Difficult urination  [] Anuric   [] Blood in urine Skin:  [] Rashes   [] Ulcers  Psychological:  [] History of anxiety   []  History of major depression  ? Memory Difficulties     Objective:   Physical Exam  BP (!) 155/67 (BP Location: Left Arm, Patient Position: Sitting)   Pulse (!) 54   Resp 18   Ht 5\' 7"  (1.702 m)   Wt 240 lb (108.9 kg)   LMP 03/16/2015 Comment: partial hysterectomy  BMI 37.59 kg/m   Past Medical History:  Diagnosis Date  . Anginal pain (HCC)   . Anxiety   . Bipolar disorder (HCC)   . CAD (coronary artery disease)   . CHF (congestive heart failure) (HCC)   . Chronic lower back pain   . Depression   . Endometriosis   . ESRD (end stage renal disease) on dialysis (HCC)    "[facility]; [address]; [place]; TTS" (01/19/2016)  . Gastroparesis   . GERD (gastroesophageal reflux disease)   . Heart attack (HCC) 08/2017  stent placed, [facility]  . History of blood transfusion "several"   "my blood would get low; low RBC"  . History of hiatal hernia   . HLD (hyperlipidemia)   . Hypertension   . Migraine    "monthly" (01/19/2016)  . Myocardial infarction (HCC) 2017   "~ 3 wks ago" (01/19/2016)  . Renal disorder   . Renal insufficiency   . Seizures (HCC) 07/2015   "I've only had the 1; don't know what from" (01/19/2016)  . Stroke (HCC)   . Type II diabetes mellitus (HCC)      Gen: WD/WN, NAD Head: [place]/AT, No temporalis wasting.  Ear/Nose/Throat: Hearing grossly intact, nares  w/o erythema or drainage Eyes: PER, EOMI, sclera nonicteric.  Neck: Supple, no masses.  No JVD.  Pulmonary:  Good air movement, no use of accessory muscles.  Cardiac: RRR Vascular:  Right brachiocephalic fistula, good thrill felt and bruit auscultated.  No aneurysmal degeneration present. Vessel Right Left  Radial Palpable Palpable   Gastrointestinal: soft, non-distended. No guarding/no peritoneal signs.  Musculoskeletal: M/S 5/5 throughout.    Right BKA.  In a wheelchair. Neurologic: Pain and light touch intact in extremities.  Symmetrical.  Speech is fluent. Motor exam as listed above. Psychiatric: Judgment intact, Mood & affect appropriate for pt's clinical situation. Dermatologic: No Venous rashes. No Ulcers Noted.  No changes consistent with cellulitis. Lymph : No Cervical lymphadenopathy, no lichenification or skin changes of chronic lymphedema.   Social History   Socioeconomic History  . Marital status: Widowed    Spouse name: Not on file  . Number of children: 2  . Years of education: Not on file  . Highest education level: Not on file  Occupational History  . Occupation: disabled  Social Needs  . Financial resource strain: Not on file  . Food insecurity:    Worry: Not on file    Inability: Not on file  . Transportation needs:    Medical: Not on file    Non-medical: Not on file  Tobacco Use  . Smoking status: Former Smoker    Packs/day: 0.75    Types: Cigarettes    Last attempt to quit: 07/01/2015    Years since quitting: 2.5  . Smokeless tobacco: Never Used  Substance and Sexual Activity  . Alcohol use: No    Alcohol/week: 0.0 standard drinks  . Drug use: No  . Sexual activity: Not Currently  Lifestyle  . Physical activity:    Days per week: Not on file    Minutes per session: Not on file  . Stress: Not on file  Relationships  . Social connections:    Talks on phone: Not on file    Gets together: Not on file    Attends religious service: Not on file     Active member of club or organization: Not on file    Attends meetings of clubs or organizations: Not on file    Relationship status: Not on file  . Intimate partner violence:    Fear of current or ex partner: Not on file    Emotionally abused: Not on file    Physically abused: Not on file    Forced sexual activity: Not on file  Other Topics Concern  . Not on file  Social History Narrative   06/17/16 currently at [facility], [place], [place] [phone]   widowed    Past Surgical History:  Procedure Laterality Date  . A/V FISTULAGRAM Left 11/27/2017   Procedure: A/V FISTULAGRAM;  Surgeon: Annice Needy, MD;  Location: [facility];  Service: Cardiovascular;  Laterality: Left;  . ABDOMINAL HYSTERECTOMY     "partial"  . AV FISTULA PLACEMENT Right 08/23/2016   Procedure: ARTERIOVENOUS (AV) FISTULA CREATION  ( BRACHIAL CEPHALIC );  Surgeon: Renford Dills, MD;  Location: [facility] ORS;  Service: Vascular;  Laterality: Right;  . BELOW KNEE LEG AMPUTATION Right 2010?  [name] CARDIAC CATHETERIZATION Right 07/10/2015   Procedure: Left Heart Cath and Coronary Angiography;  Surgeon: Laurier Nancy, MD;  Location: [facility];  Service: Cardiovascular;  Laterality: Right;  . CARDIAC CATHETERIZATION Right 09/14/2015   Procedure: Left Heart Cath and Coronary Angiography;  Surgeon: Laurier Nancy, MD;  Location: [facility];  Service: Cardiovascular;  Laterality: Right;  . CARDIAC CATHETERIZATION N/A 09/14/2015   Procedure: Coronary Stent Intervention;  Surgeon: Alwyn Pea, MD;  Location: [facility];  Service: Cardiovascular;  Laterality: N/A;  . CARDIAC CATHETERIZATION Right 11/20/2015   Procedure: Left Heart Cath and Coronary Angiography;  Surgeon: Laurier Nancy, MD;  Location: [facility];  Service: Cardiovascular;  Laterality: Right;  . CATARACT EXTRACTION, BILATERAL    . CORONARY ANGIOPLASTY WITH STENT PLACEMENT  <2017   @ [facility]/notes 07/03/2013  .  CORONARY ANGIOPLASTY WITH STENT PLACEMENT    . CORONARY STENT INTERVENTION N/A 08/28/2017   Procedure: CORONARY STENT INTERVENTION;  Surgeon: Alwyn Pea, MD;  Location: [facility];  Service: Cardiovascular;  Laterality: N/A;  . DIALYSIS/PERMA CATHETER REMOVAL N/A 11/12/2016   Procedure: DIALYSIS/PERMA CATHETER REMOVAL;  Surgeon: Renford Dills, MD;  Location: [facility];  Service: Cardiovascular;  Laterality: N/A;  . HYSTEROTOMY    . PERIPHERAL VASCULAR CATHETERIZATION N/A 08/30/2015   Procedure: Dialysis/Perma Catheter Insertion;  Surgeon: Annice Needy, MD;  Location: [facility];  Service: Cardiovascular;  Laterality: N/A;  . PERIPHERAL VASCULAR CATHETERIZATION N/A 01/01/2016   Procedure: Dialysis/Perma Catheter Insertion;  Surgeon: Annice Needy, MD;  Location: [facility];  Service: Cardiovascular;  Laterality: N/A;  . PERITONEAL CATHETER INSERTION  11/03/2013   [name] 11/03/2013  . PERITONEAL CATHETER REMOVAL  01/01/2016   "took the one from May out; put new PD cath in" (01/19/2016)  . PERITONEAL CATHETER REMOVAL  11/03/2013; 02/11/2014   [name] 11/03/2013; Removal of tunneled catheter/notes 02/11/2014  . RIGHT/LEFT HEART CATH AND CORONARY ANGIOGRAPHY N/A 08/28/2017   Procedure: RIGHT/LEFT HEART CATH AND CORONARY ANGIOGRAPHY;  Surgeon: Laurier Nancy, MD;  Location: [facility];  Service: Cardiovascular;  Laterality: N/A;  . SALPINGOOPHORECTOMY Right    [name] 07/03/2013  . TEE WITHOUT CARDIOVERSION N/A 06/07/2016   Procedure: TRANSESOPHAGEAL ECHOCARDIOGRAM (TEE);  Surgeon: Chrystie Nose, MD;  Location: [facility] ENDOSCOPY;  Service: Cardiovascular;  Laterality: N/A;  . TUBAL LIGATION      Family History  Problem Relation Age of Onset  . CAD Unknown   . Diabetes Unknown   . Bipolar disorder Unknown   . Cervical cancer Mother   . Other Father        heart bypass  . Breast cancer Neg Hx     Allergies  Allergen Reactions  . Cephalosporins  Anaphylaxis    Patient has tolerated meropenem.   [name] Penicillins Anaphylaxis and Other (See Comments)    Has patient had a PCN reaction causing immediate rash, facial/tongue/throat swelling, SOB or lightheadedness with hypotension: Yes Has patient had a PCN reaction causing severe rash involving mucus  membranes or skin necrosis: No Has patient had a PCN reaction that required hospitalization No Has patient had a PCN reaction occurring within the last 10 years: No If all of the above answers are "NO", then may proceed with Cephalosporin use.  . Reglan [Metoclopramide] Other (See Comments)    Severe EPS (lip, head, body tremors) March 2018  . Risperdal [Risperidone] Other (See Comments)    Severe EPS (lip, head, body tremors) March 2018  . Lamictal [Lamotrigine] Other (See Comments)    Reaction:  Hallucinations  . Phenergan [Promethazine Hcl] Nausea And Vomiting    Sensitivity to medicine  . Sulfamethoxazole-Trimethoprim     Other reaction(s): hyperkalemia  . Sulfasalazine Other (See Comments)    Reaction:  Unknown   . Pravastatin Other (See Comments)    Reaction:  Muscle pain   . Sulfa Antibiotics Other (See Comments)    Reaction:  Unknown        Assessment & Plan:   1. ESRD on dialysis [facility]) Recommend:  The patient is doing well and currently has adequate dialysis access. The patient's dialysis center is not reporting any access issues. Flow pattern is stable when compared to the prior ultrasound.  The patient should have a duplex ultrasound of the dialysis access in 6 months. The patient will follow-up with me in the office after each ultrasound    - VAS US DUPLEX DIALYSIS ACCESS (AVF, AVG); Future  2. Essential hypertension Continue antihypertensive medications as already ordered, these medications have been reviewed and there are no changes at this time.   3. Gastroesophageal reflux disease without esophagitis Continue PPI as already ordered, this medication has been  reviewed and there are no changes at this time.  Avoidence of caffeine and alcohol  Moderate elevation of the head of the bed   4. Mixed hyperlipidemia Continue statin as ordered and reviewed, no changes at this time    Current Outpatient Medications on File Prior to Visit  Medication Sig Dispense Refill  . acetaminophen (TYLENOL) 500 MG tablet Take 500 mg by mouth every 6 (six) hours as needed for moderate pain or fever.    [name] albuterol (PROVENTIL) (2.5 MG/3ML) 0.083% nebulizer solution Take 2.5 mg by nebulization every 4 (four) hours as needed for wheezing or shortness of breath.    [name] amLODipine (NORVASC) 10 MG tablet Take 10 mg by mouth daily.     [name] aspirin 81 MG chewable tablet Chew 81 mg by mouth daily.    [name] atorvastatin (LIPITOR) 40 MG tablet Take 40 mg by mouth daily at 6 PM.    . benztropine (COGENTIN) 0.5 MG tablet Take 0.5 mg by mouth 2 (two) times daily.    . bisacodyl (DULCOLAX) 10 MG suppository Place 10 mg rectally as needed for moderate constipation.    . budesonide (PULMICORT) 0.5 MG/2ML nebulizer solution Take 2 mLs (0.5 mg total) by nebulization 2 (two) times daily.  12  . carboxymethylcellul-glycerin (OPTIVE) 0.5-0.9 % ophthalmic solution Apply to eye.    . carboxymethylcellulose (REFRESH) 1 % ophthalmic solution Place 1 drop into both eyes 3 (three) times daily.    . clonazePAM (KLONOPIN) 0.5 MG tablet Take  tablet (0.25MG ) by mouth every morning and 1 tablet (0.5MG ) by mouth every evening    . clopidogrel (PLAVIX) 75 MG tablet Take 75 mg by mouth daily at 12 noon.     . escitalopram (LEXAPRO) 5 MG tablet Take 5 mg by mouth daily.     . fluticasone (FLONASE) 50 MCG/ACT  nasal spray Place 1 spray into both nostrils daily.     [name] gabapentin (NEURONTIN) 300 MG capsule Take 300 mg by mouth daily.     . hydrALAZINE (APRESOLINE) 50 MG tablet Take 1 tablet (50MG ) by mouth three times daily (Sunday, Tuesday, Thursday & Saturday) and take 1 tablet (50MG ) by mouth twice daily  (Monday, Wednesday & Friday).    [name] HYDROcodone-acetaminophen (NORCO/VICODIN) 5-325 MG tablet Take 1 tablet by mouth every 6 (six) hours as needed for moderate pain or severe pain. 4 tablet 0  . insulin aspart (NOVOLOG) 100 UNIT/ML injection Inject 0-9 Units into the skin every 4 (four) hours. 10 mL 11  . ipratropium-albuterol (DUONEB) 0.5-2.5 (3) MG/3ML SOLN Take 3 mLs by nebulization every 6 (six) hours. 360 mL   . isosorbide mononitrate (IMDUR) 30 MG 24 hr tablet Take 90 mg by mouth daily at 12 noon.     . lactulose (CHRONULAC) 10 GM/15ML solution Take 10 g by mouth daily at 6 PM.     . levETIRAcetam (KEPPRA) 750 MG tablet Take 2 tablets (1,500 mg total) by mouth daily at 12 noon. 30 tablet 0  . lidocaine (LIDODERM) 5 % Place 1 patch onto the skin at bedtime.     . lidocaine (LMX) 4 % cream Apply 1 application topically daily as needed (pain). To right arm fistula    . lisinopril (PRINIVIL,ZESTRIL) 10 MG tablet Take 10 mg by mouth daily at 12 noon.    . Melatonin 3 MG TABS Take 3 mg by mouth at bedtime.    . metoprolol (LOPRESSOR) 50 MG tablet Take 1 tablet (50 mg total) by mouth 2 (two) times daily.    . multivitamin (ONE-A-DAY MEN'S) TABS tablet Take 1 tablet by mouth daily.    . nitroGLYCERIN (NITROSTAT) 0.4 MG SL tablet Place 0.4 mg under the tongue every 5 (five) minutes as needed for chest pain. Reported on 07/10/2015    . omeprazole (PRILOSEC) 20 MG capsule Take 20 mg by mouth 2 (two) times daily.     . ondansetron (ZOFRAN ODT) 4 MG disintegrating tablet Take 1 tablet (4 mg total) by mouth every 6 (six) hours as needed for nausea or vomiting. 20 tablet 0  . OXcarbazepine (TRILEPTAL) 150 MG tablet Take 150 mg by mouth at bedtime.    . polyethylene glycol powder (GLYCOLAX/MIRALAX) powder 1 cap full in a full glass of water, two times a day for 3 days. 255 g 0  . senna (SENOKOT) 8.6 MG TABS tablet Take 2 tablets by mouth 2 (two) times daily.     . sevelamer carbonate (RENVELA) 800 MG tablet  Take 800 mg by mouth 4 (four) times daily -  before meals and at bedtime.     . Tiotropium Bromide Monohydrate (SPIRIVA RESPIMAT) 2.5 MCG/ACT AERS Inhale into the lungs.    . torsemide (DEMADEX) 100 MG tablet Take 100 mg by mouth daily.     . traZODone (DESYREL) 50 MG tablet Take 0.5 tablets (25 mg total) by mouth at bedtime as needed for sleep.     No current facility-administered medications on file prior to visit.     There are no Patient Instructions on file for this visit. Return in about 6 months (around 07/10/2018).   Georgiana Spinner, NP

## 2018-02-10 IMAGING — CT CT HEAD W/O CM
1 series · 16 of 30 positions shown, 20 images · non-contrast
Comparison: 05/23/2004

CLINICAL DATA: Left leg weakness.

EXAM:
CT HEAD WITHOUT CONTRAST
TECHNIQUE: Contiguous axial images were obtained from the base of the skull
through the vertex without intravenous contrast.

[Series 2: head wo · axial · 0.41mm/px · z∈[+278,+422]mm · 16 of 36 slices shown, 20 images]
[im 2/36  brain]
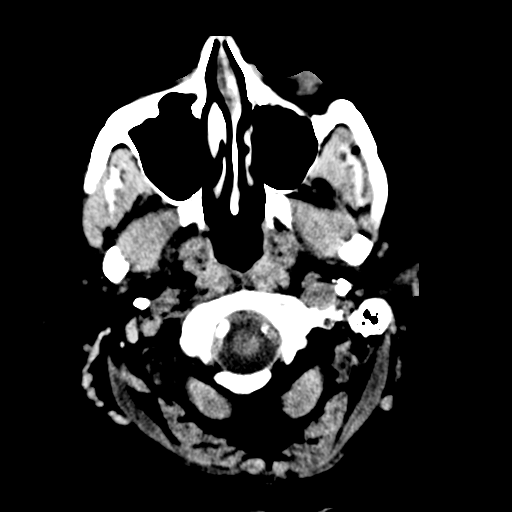
[im 2/36  bone]
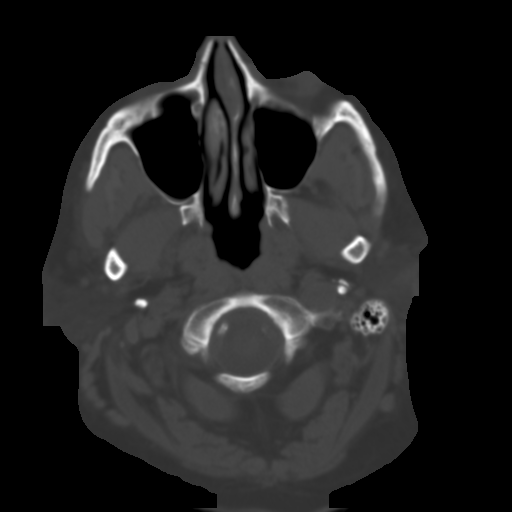
[im 4/36  brain]
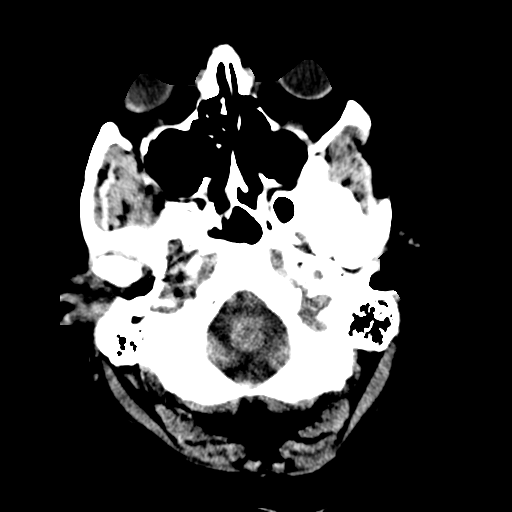
[im 7/36  brain]
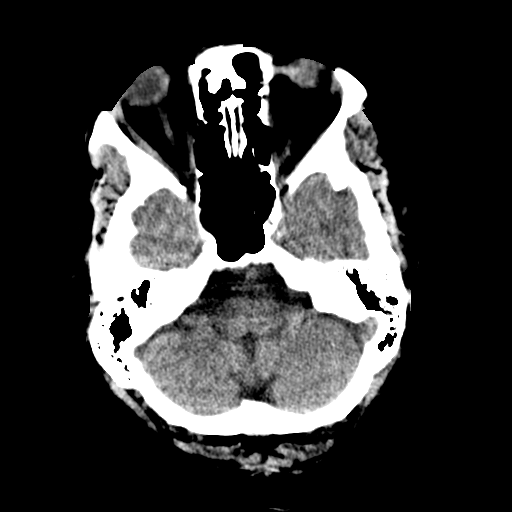
[im 9/36  brain]
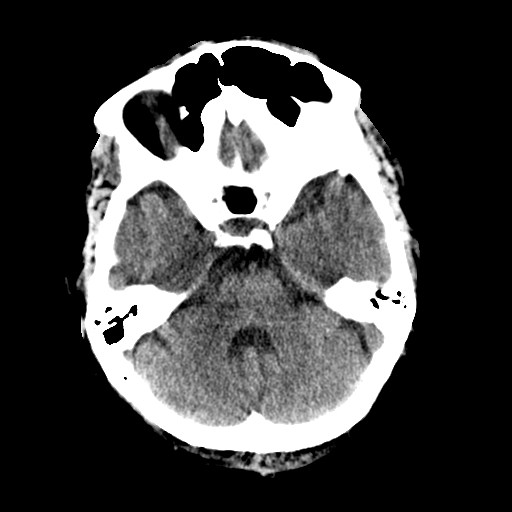
[im 10/36  brain]
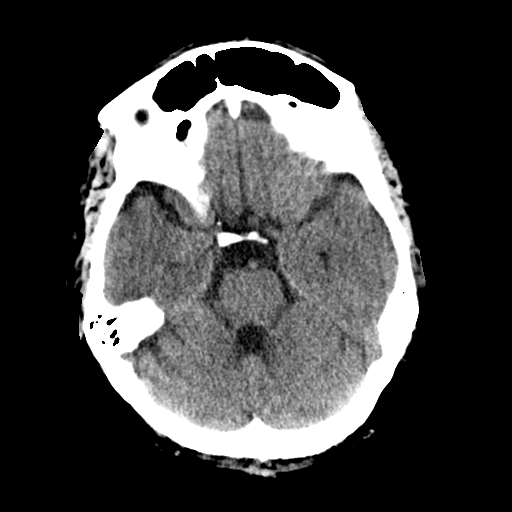
[im 10/36  bone]
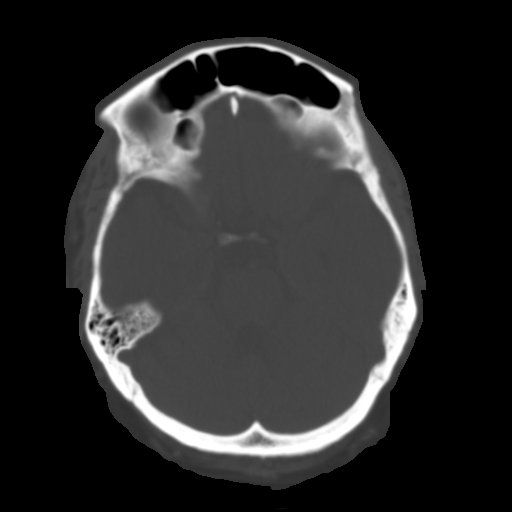
[im 13/36  brain]
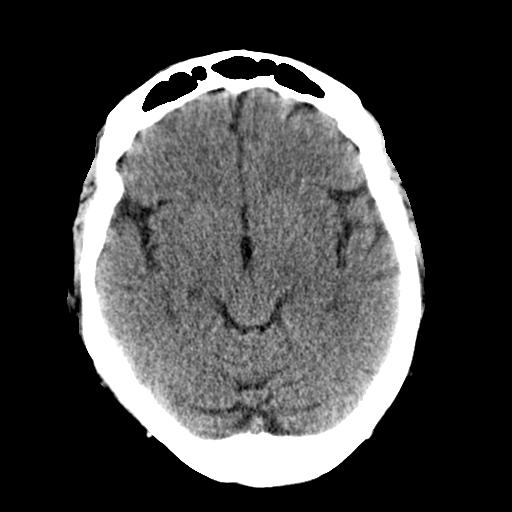
[im 15/36  brain]
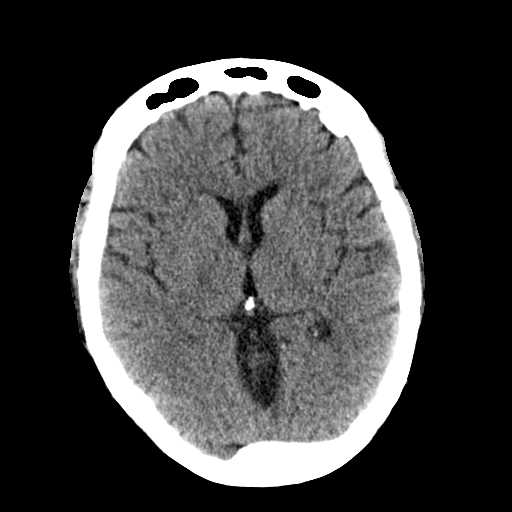
[im 17/36  brain]
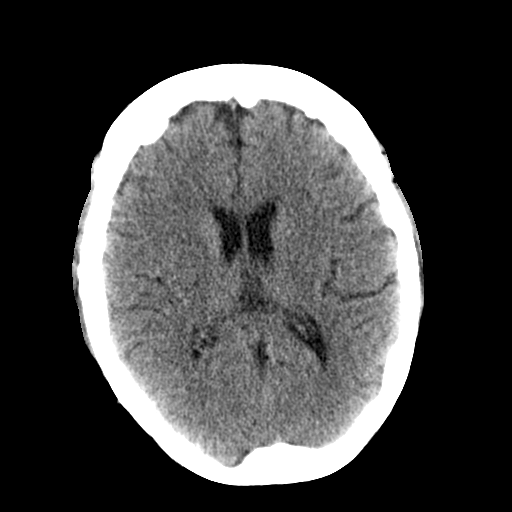
[im 19/36  brain]
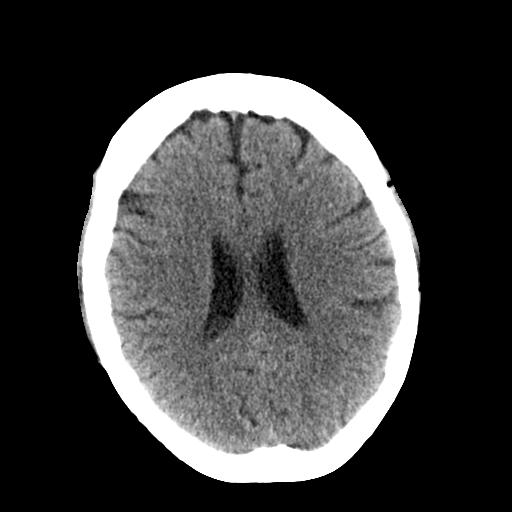
[im 19/36  bone]
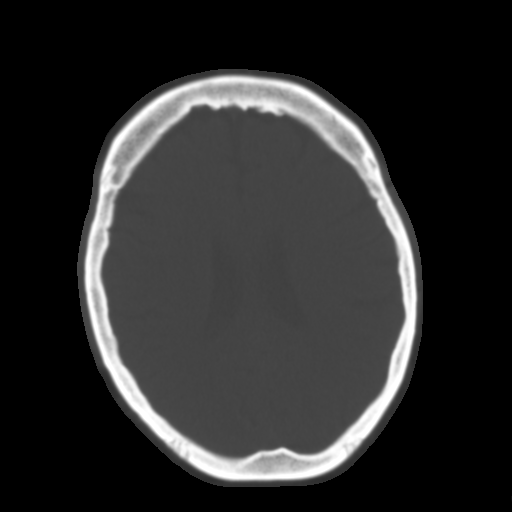
[im 21/36  brain]
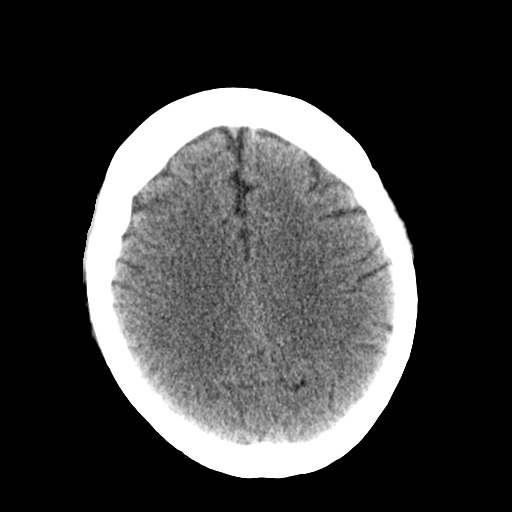
[im 23/36  brain]
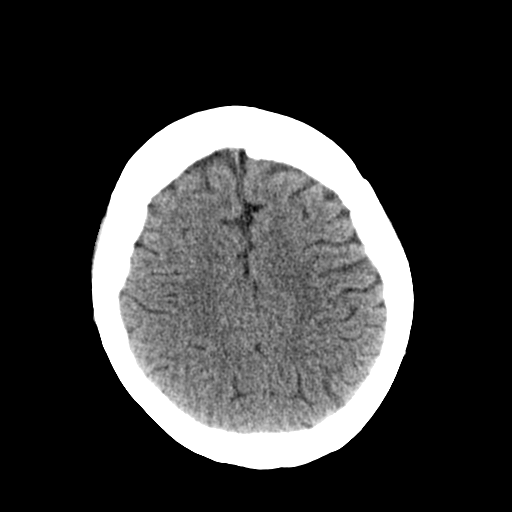
[im 26/36  brain]
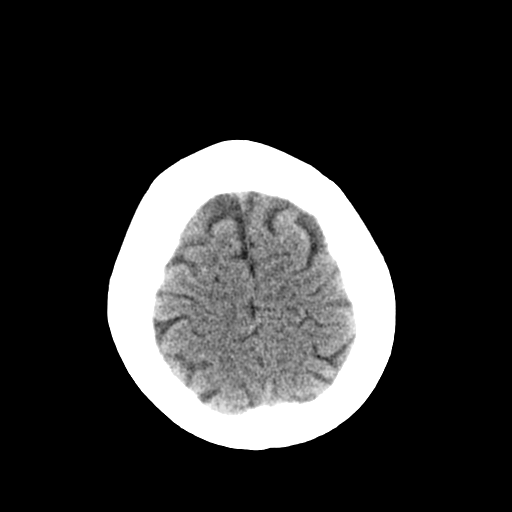
[im 27/36  brain]
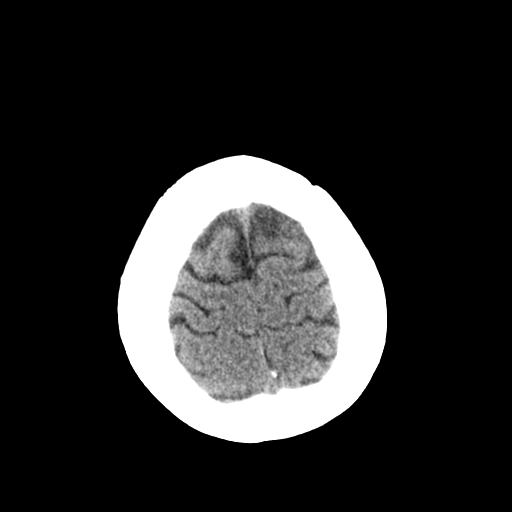
[im 27/36  bone]
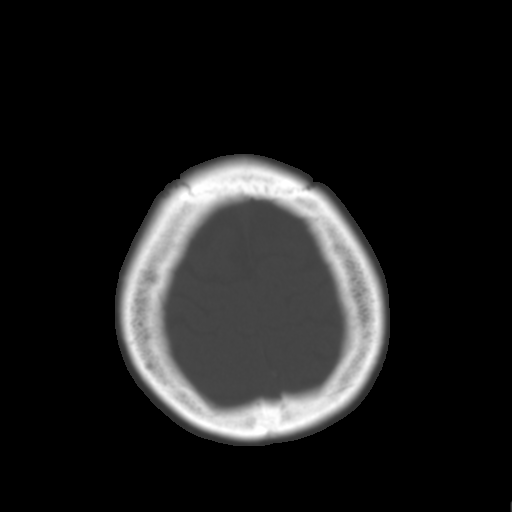
[im 29/36  brain]
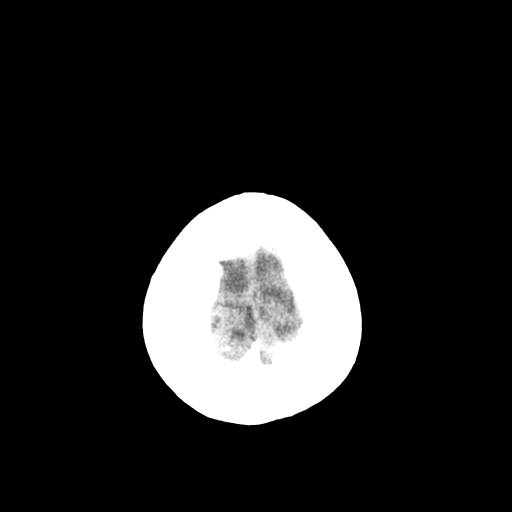
[im 32/36  brain]
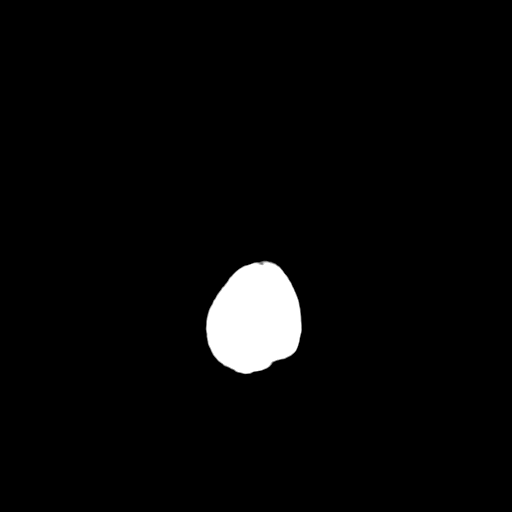
[im 34/36  brain]
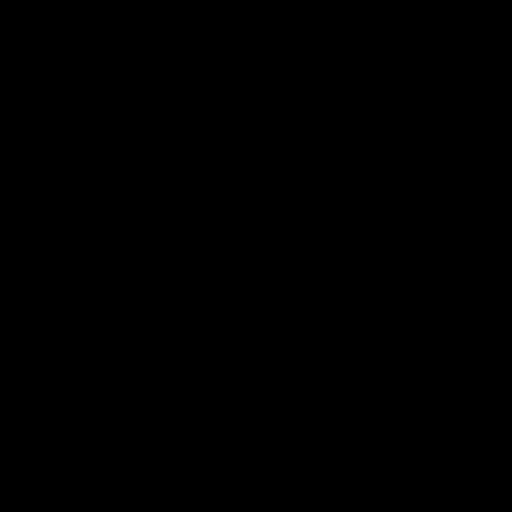

[16 of 30 positions shown; findings below may reference images not displayed]

FINDINGS: Skull and Sinuses:Negative for fracture or destructive process. The
visualized mastoids, middle ears, and imaged paranasal sinuses are
clear.

Visualized orbits: Interval bilateral cataract resection.

Brain: No acute or remote infarction, hemorrhage, hydrocephalus, or
mass lesion/mass effect. Normal cerebral volume. Intermittently poor
visualization of gray-white differentiation is attributed to
scanner/technique.
IMPRESSION: No acute finding or explanation for leg weakness.

## 2018-02-18 ENCOUNTER — Other Ambulatory Visit: Payer: Self-pay | Admitting: General Surgery

## 2018-02-19 ENCOUNTER — Ambulatory Visit (HOSPITAL_COMMUNITY): Admission: RE | Admit: 2018-02-19 | Payer: Medicare HMO | Source: Ambulatory Visit

## 2018-02-19 ENCOUNTER — Encounter (HOSPITAL_COMMUNITY): Payer: Self-pay

## 2018-02-19 ENCOUNTER — Other Ambulatory Visit: Payer: Self-pay | Admitting: General Surgery

## 2018-02-19 DIAGNOSIS — N6489 Other specified disorders of breast: Secondary | ICD-10-CM

## 2018-02-24 ENCOUNTER — Other Ambulatory Visit: Payer: Self-pay

## 2018-02-24 ENCOUNTER — Other Ambulatory Visit: Payer: Self-pay | Admitting: Physician Assistant

## 2018-02-24 ENCOUNTER — Inpatient Hospital Stay
Admission: EM | Admit: 2018-02-24 | Discharge: 2018-02-26 | DRG: 070 | Disposition: A | Discharge status: Skilled Nursing Facility | Payer: Medicare HMO | Attending: Specialist | Admitting: Specialist

## 2018-02-24 ENCOUNTER — Emergency Department: Payer: Medicare HMO

## 2018-02-24 DIAGNOSIS — N3090 Cystitis, unspecified without hematuria: Secondary | ICD-10-CM

## 2018-02-24 DIAGNOSIS — M545 Low back pain: Secondary | ICD-10-CM | POA: Diagnosis present

## 2018-02-24 DIAGNOSIS — N390 Urinary tract infection, site not specified: Secondary | ICD-10-CM | POA: Diagnosis present

## 2018-02-24 DIAGNOSIS — Z89511 Acquired absence of right leg below knee: Secondary | ICD-10-CM

## 2018-02-24 DIAGNOSIS — G43909 Migraine, unspecified, not intractable, without status migrainosus: Secondary | ICD-10-CM | POA: Diagnosis present

## 2018-02-24 DIAGNOSIS — E1122 Type 2 diabetes mellitus with diabetic chronic kidney disease: Secondary | ICD-10-CM | POA: Diagnosis present

## 2018-02-24 DIAGNOSIS — Z992 Dependence on renal dialysis: Secondary | ICD-10-CM | POA: Diagnosis not present

## 2018-02-24 DIAGNOSIS — E785 Hyperlipidemia, unspecified: Secondary | ICD-10-CM | POA: Diagnosis present

## 2018-02-24 DIAGNOSIS — Z90710 Acquired absence of both cervix and uterus: Secondary | ICD-10-CM

## 2018-02-24 DIAGNOSIS — G8929 Other chronic pain: Secondary | ICD-10-CM | POA: Diagnosis present

## 2018-02-24 DIAGNOSIS — F319 Bipolar disorder, unspecified: Secondary | ICD-10-CM | POA: Diagnosis present

## 2018-02-24 DIAGNOSIS — Z79899 Other long term (current) drug therapy: Secondary | ICD-10-CM

## 2018-02-24 DIAGNOSIS — Z794 Long term (current) use of insulin: Secondary | ICD-10-CM

## 2018-02-24 DIAGNOSIS — Z8049 Family history of malignant neoplasm of other genital organs: Secondary | ICD-10-CM

## 2018-02-24 DIAGNOSIS — I252 Old myocardial infarction: Secondary | ICD-10-CM

## 2018-02-24 DIAGNOSIS — Z8673 Personal history of transient ischemic attack (TIA), and cerebral infarction without residual deficits: Secondary | ICD-10-CM | POA: Diagnosis not present

## 2018-02-24 DIAGNOSIS — K219 Gastro-esophageal reflux disease without esophagitis: Secondary | ICD-10-CM | POA: Diagnosis present

## 2018-02-24 DIAGNOSIS — Z7902 Long term (current) use of antithrombotics/antiplatelets: Secondary | ICD-10-CM | POA: Diagnosis not present

## 2018-02-24 DIAGNOSIS — I2510 Atherosclerotic heart disease of native coronary artery without angina pectoris: Secondary | ICD-10-CM | POA: Diagnosis present

## 2018-02-24 DIAGNOSIS — Z880 Allergy status to penicillin: Secondary | ICD-10-CM

## 2018-02-24 DIAGNOSIS — I132 Hypertensive heart and chronic kidney disease with heart failure and with stage 5 chronic kidney disease, or end stage renal disease: Secondary | ICD-10-CM | POA: Diagnosis present

## 2018-02-24 DIAGNOSIS — Z882 Allergy status to sulfonamides status: Secondary | ICD-10-CM

## 2018-02-24 DIAGNOSIS — D631 Anemia in chronic kidney disease: Secondary | ICD-10-CM | POA: Diagnosis present

## 2018-02-24 DIAGNOSIS — N2581 Secondary hyperparathyroidism of renal origin: Secondary | ICD-10-CM | POA: Diagnosis present

## 2018-02-24 DIAGNOSIS — Z7982 Long term (current) use of aspirin: Secondary | ICD-10-CM

## 2018-02-24 DIAGNOSIS — K3184 Gastroparesis: Secondary | ICD-10-CM | POA: Diagnosis present

## 2018-02-24 DIAGNOSIS — N6489 Other specified disorders of breast: Secondary | ICD-10-CM

## 2018-02-24 DIAGNOSIS — G9341 Metabolic encephalopathy: Principal | ICD-10-CM | POA: Diagnosis present

## 2018-02-24 DIAGNOSIS — G9340 Encephalopathy, unspecified: Secondary | ICD-10-CM | POA: Diagnosis present

## 2018-02-24 DIAGNOSIS — E1121 Type 2 diabetes mellitus with diabetic nephropathy: Secondary | ICD-10-CM

## 2018-02-24 DIAGNOSIS — K449 Diaphragmatic hernia without obstruction or gangrene: Secondary | ICD-10-CM | POA: Diagnosis present

## 2018-02-24 DIAGNOSIS — Z7951 Long term (current) use of inhaled steroids: Secondary | ICD-10-CM

## 2018-02-24 DIAGNOSIS — E1143 Type 2 diabetes mellitus with diabetic autonomic (poly)neuropathy: Secondary | ICD-10-CM | POA: Diagnosis present

## 2018-02-24 DIAGNOSIS — I5032 Chronic diastolic (congestive) heart failure: Secondary | ICD-10-CM | POA: Diagnosis present

## 2018-02-24 DIAGNOSIS — R569 Unspecified convulsions: Secondary | ICD-10-CM | POA: Diagnosis present

## 2018-02-24 DIAGNOSIS — Z881 Allergy status to other antibiotic agents status: Secondary | ICD-10-CM

## 2018-02-24 DIAGNOSIS — N186 End stage renal disease: Secondary | ICD-10-CM | POA: Diagnosis present

## 2018-02-24 DIAGNOSIS — Z87891 Personal history of nicotine dependence: Secondary | ICD-10-CM

## 2018-02-24 DIAGNOSIS — F419 Anxiety disorder, unspecified: Secondary | ICD-10-CM | POA: Diagnosis present

## 2018-02-24 DIAGNOSIS — J449 Chronic obstructive pulmonary disease, unspecified: Secondary | ICD-10-CM | POA: Diagnosis present

## 2018-02-24 DIAGNOSIS — Z888 Allergy status to other drugs, medicaments and biological substances status: Secondary | ICD-10-CM

## 2018-02-24 DIAGNOSIS — R4182 Altered mental status, unspecified: Secondary | ICD-10-CM | POA: Diagnosis present

## 2018-02-24 DIAGNOSIS — B961 Klebsiella pneumoniae [K. pneumoniae] as the cause of diseases classified elsewhere: Secondary | ICD-10-CM | POA: Diagnosis present

## 2018-02-24 LAB — CBC
HEMATOCRIT: 39.3 % (ref 36.0–46.0)
Hemoglobin: 11.5 g/dL — ABNORMAL LOW (ref 12.0–15.0)
MCH: 28.5 pg (ref 26.0–34.0)
MCHC: 29.3 g/dL — ABNORMAL LOW (ref 30.0–36.0)
MCV: 97.3 fL (ref 80.0–100.0)
NRBC: 0 % (ref 0.0–0.2)
PLATELETS: 162 10*3/uL (ref 150–400)
RBC: 4.04 MIL/uL (ref 3.87–5.11)
RDW: 17.3 % — AB (ref 11.5–15.5)
WBC: 5.7 10*3/uL (ref 4.0–10.5)

## 2018-02-24 LAB — COMPREHENSIVE METABOLIC PANEL
ALBUMIN: 3.4 g/dL — AB (ref 3.5–5.0)
ALT: 9 U/L (ref 0–44)
AST: 32 U/L (ref 15–41)
Alkaline Phosphatase: 168 U/L — ABNORMAL HIGH (ref 38–126)
Anion gap: 11 (ref 5–15)
BUN: 18 mg/dL (ref 6–20)
CHLORIDE: 98 mmol/L (ref 98–111)
CO2: 29 mmol/L (ref 22–32)
CREATININE: 5.38 mg/dL — AB (ref 0.44–1.00)
Calcium: 8.9 mg/dL (ref 8.9–10.3)
GFR, EST AFRICAN AMERICAN: 10 mL/min — AB (ref >60)
GFR, EST NON AFRICAN AMERICAN: 8 mL/min — AB (ref >60)
Glucose, Bld: 104 mg/dL — ABNORMAL HIGH (ref 70–99)
Potassium: 4.3 mmol/L (ref 3.5–5.1)
SODIUM: 138 mmol/L (ref 135–145)
Total Bilirubin: 0.8 mg/dL (ref 0.3–1.2)
Total Protein: 6.6 g/dL (ref 6.5–8.1)

## 2018-02-24 LAB — GLUCOSE, CAPILLARY
GLUCOSE-CAPILLARY: 79 mg/dL (ref 70–99)
GLUCOSE-CAPILLARY: 104 mg/dL — AB (ref 70–99)

## 2018-02-24 LAB — URINALYSIS, COMPLETE (UACMP) WITH MICROSCOPIC
Bilirubin Urine: NEGATIVE
GLUCOSE, UA: 50 mg/dL — AB
Ketones, ur: NEGATIVE mg/dL
NITRITE: NEGATIVE
PH: 7 (ref 5.0–8.0)
Protein, ur: 100 mg/dL — AB
SPECIFIC GRAVITY, URINE: 1.008 (ref 1.005–1.030)
WBC, UA: >50 WBC/hpf — ABNORMAL HIGH (ref 0–5)

## 2018-02-24 LAB — MRSA PCR SCREENING: MRSA by PCR: POSITIVE — AB

## 2018-02-24 MED ORDER — INSULIN ASPART 100 UNIT/ML ~~LOC~~ SOLN: 0.0000 [IU] | Freq: Every day | SUBCUTANEOUS | Status: DC

## 2018-02-24 MED ORDER — SODIUM CHLORIDE 0.9% FLUSH: 3.0000 mL | INTRAVENOUS | Status: DC | PRN

## 2018-02-24 MED ORDER — MORPHINE SULFATE (PF) 2 MG/ML IV SOLN: 1.0000 mg | Freq: Once | INTRAVENOUS | Status: DC

## 2018-02-24 MED ORDER — LEVOFLOXACIN IN D5W 750 MG/150ML IV SOLN
750.0000 mg | Freq: Once | INTRAVENOUS | Status: AC
  Administered 2018-02-24: 750 mg via INTRAVENOUS
  Filled 2018-02-24: qty 150

## 2018-02-24 MED ORDER — SODIUM CHLORIDE 0.9 % IV SOLN: 250.0000 mL | INTRAVENOUS | Status: DC | PRN

## 2018-02-24 MED ORDER — SODIUM CHLORIDE 0.9 % IV SOLN: INTRAVENOUS | Status: DC

## 2018-02-24 MED ORDER — SODIUM CHLORIDE 0.9% FLUSH
3.0000 mL | Freq: Two times a day (BID) | INTRAVENOUS | Status: DC
  Administered 2018-02-24 – 2018-02-26 (×3): 3 mL via INTRAVENOUS

## 2018-02-24 MED ORDER — ENOXAPARIN SODIUM 40 MG/0.4ML ~~LOC~~ SOLN
40.0000 mg | Freq: Two times a day (BID) | SUBCUTANEOUS | Status: DC
  Administered 2018-02-25: 40 mg via SUBCUTANEOUS
  Filled 2018-02-24: qty 0.4

## 2018-02-24 MED ORDER — ACETAMINOPHEN 325 MG PO TABS
650.0000 mg | ORAL_TABLET | Freq: Four times a day (QID) | ORAL | Status: DC | PRN
  Administered 2018-02-24 – 2018-02-25 (×2): 650 mg via ORAL
  Filled 2018-02-24 (×2): qty 2

## 2018-02-24 MED ORDER — ACETAMINOPHEN 650 MG RE SUPP: 650.0000 mg | Freq: Four times a day (QID) | RECTAL | Status: DC | PRN

## 2018-02-24 MED ORDER — ONDANSETRON HCL 4 MG PO TABS: 4.0000 mg | ORAL_TABLET | Freq: Four times a day (QID) | ORAL | Status: DC | PRN

## 2018-02-24 MED ORDER — INSULIN ASPART 100 UNIT/ML ~~LOC~~ SOLN
0.0000 [IU] | Freq: Three times a day (TID) | SUBCUTANEOUS | Status: DC
  Administered 2018-02-26 (×2): 1 [IU] via SUBCUTANEOUS
  Filled 2018-02-24 (×2): qty 1

## 2018-02-24 MED ORDER — ONDANSETRON HCL 4 MG/2ML IJ SOLN: 4.0000 mg | Freq: Four times a day (QID) | INTRAMUSCULAR | Status: DC | PRN

## 2018-02-24 NOTE — ED Triage Notes (Signed)
Pt to ER via EMS from [facility] with c/o altered mental status for 3-4 days.  Facility sent labs and UA, have not received labs yet, ua shows possible UTI.  Pt last had dialysis on Sunday was due today, but was unable to go.  Pt sent out by request of family.

## 2018-02-24 NOTE — H&P (Signed)
[facility] [place] at [facility]   PATIENT NAME: Kimberly Montgomery    MR#:  [id number]  DATE OF BIRTH:  Oct 14, 1965  DATE OF ADMISSION:  02/24/2018  PRIMARY CARE PHYSICIAN: Dorothey Baseman, MD   REQUESTING/REFERRING PHYSICIAN: Scotty Court  CHIEF COMPLAINT:  Altered mental status  HISTORY OF PRESENT ILLNESS:  Kimberly Montgomery  is a 52 y.o. female with a known history of end-stage renal disease on hemodialysis Tuesday, Thursday and Saturday, hyper essential hypertension, diabetes mellitus, bipolar disorder and other medical problems is brought into the emergency department for altered mental status for the past 4 days.  Labs were drawn at the facility but the results have not received as the patient's mentation is getting worse patient is transferred to the emergency department.  Urine looks abnormal Patient being allergic to penicillin and cephalosporins she is started on levofloxacin and hospitalist is contacted with the patient.  Patient is a poor historian arousable but not answering any questions.  No family members at bedside.  Voicemail left to daughter to call back  PAST MEDICAL HISTORY:   Past Medical History:  Diagnosis Date  . Anginal pain (HCC)   . Anxiety   . Bipolar disorder (HCC)   . CAD (coronary artery disease)   . CHF (congestive heart failure) (HCC)   . Chronic lower back pain   . Depression   . Endometriosis   . ESRD (end stage renal disease) on dialysis (HCC)    "[facility]; [address]; [place]; TTS" (01/19/2016)  . Gastroparesis   . GERD (gastroesophageal reflux disease)   . Heart attack (HCC) 08/2017   stent placed, [facility]  . History of blood transfusion "several"   "my blood would get low; low RBC"  . History of hiatal hernia   . HLD (hyperlipidemia)   . Hypertension   . Migraine    "monthly" (01/19/2016)  . Myocardial infarction (HCC) 2017   "~ 3 wks ago" (01/19/2016)  . Renal disorder   . Renal insufficiency   .  Seizures (HCC) 07/2015   "I've only had the 1; don't know what from" (01/19/2016)  . Stroke (HCC)   . Type II diabetes mellitus (HCC)     PAST SURGICAL HISTOIRY:   Past Surgical History:  Procedure Laterality Date  . A/V FISTULAGRAM Left 11/27/2017   Procedure: A/V FISTULAGRAM;  Surgeon: Annice Needy, MD;  Location: [facility] INVASIVE CV LAB;  Service: Cardiovascular;  Laterality: Left;  . ABDOMINAL HYSTERECTOMY     "partial"  . AV FISTULA PLACEMENT Right 08/23/2016   Procedure: ARTERIOVENOUS (AV) FISTULA CREATION  ( BRACHIAL CEPHALIC );  Surgeon: Renford Dills, MD;  Location: [facility] ORS;  Service: Vascular;  Laterality: Right;  . BELOW KNEE LEG AMPUTATION Right 2010?  [name] CARDIAC CATHETERIZATION Right 07/10/2015   Procedure: Left Heart Cath and Coronary Angiography;  Surgeon: Laurier Nancy, MD;  Location: [facility] INVASIVE CV LAB;  Service: Cardiovascular;  Laterality: Right;  . CARDIAC CATHETERIZATION Right 09/14/2015   Procedure: Left Heart Cath and Coronary Angiography;  Surgeon: Laurier Nancy, MD;  Location: [facility] INV[facility]IVE CV [facility];  Service: Cardiovascular;  Laterality: Right;  . CARDIAC CATHETERIZATION N/A 09/14/2015   Procedure: Coronary Stent Intervention;  Surgeon: Alwyn Pea, MD;  Location: [facility] INV[facility] CV [facility];  Service: Cardiovascular;  Laterality: N/A;  . CARDIAC CATHETERIZATION Right 11/20/2015   Procedure: Left Heart Cath and Coronary Angiography;  Surgeon: Laurier Nancy, MD;  Location: [facility] INV[facility];  Service: Cardiovascular;  Laterality:  Right;  [name] CATARACT EXTRACTION, BILATERAL    . CORONARY ANGIOPLASTY WITH STENT PLACEMENT  <2017   @ [facility]/notes 07/03/2013  . CORONARY ANGIOPLASTY WITH STENT PLACEMENT    . CORONARY STENT INTERVENTION N/A 08/28/2017   Procedure: CORONARY STENT INTERVENTION;  Surgeon: Alwyn Pea, MD;  Location: [facility];  Service: Cardiovascular;  Laterality: N/A;  . DIALYSIS/PERMA CATHETER REMOVAL N/A 11/12/2016   Procedure:  DIALYSIS/PERMA CATHETER REMOVAL;  Surgeon: Renford Dills, MD;  Location: [facility] INV[facility];  Service: Cardiovascular;  Laterality: N/A;  . HYSTEROTOMY    . PERIPHERAL VASCULAR CATHETERIZATION N/A 08/30/2015   Procedure: Dialysis/Perma Catheter Insertion;  Surgeon: Annice Needy, MD;  Location: [facility];  Service: Cardiovascular;  Laterality: N/A;  . PERIPHERAL VASCULAR CATHETERIZATION N/A 01/01/2016   Procedure: Dialysis/Perma Catheter Insertion;  Surgeon: Annice Needy, MD;  Location: [facility];  Service: Cardiovascular;  Laterality: N/A;  . PERITONEAL CATHETER INSERTION  11/03/2013   [name] 11/03/2013  . PERITONEAL CATHETER REMOVAL  01/01/2016   "took the one from May out; put new PD cath in" (01/19/2016)  . PERITONEAL CATHETER REMOVAL  11/03/2013; 02/11/2014   [name] 11/03/2013; Removal of tunneled catheter/notes 02/11/2014  . RIGHT/LEFT HEART CATH AND CORONARY ANGIOGRAPHY N/A 08/28/2017   Procedure: RIGHT/LEFT HEART CATH AND CORONARY ANGIOGRAPHY;  Surgeon: Laurier Nancy, MD;  Location: [facility];  Service: Cardiovascular;  Laterality: N/A;  . SALPINGOOPHORECTOMY Right    [name] 07/03/2013  . TEE WITHOUT CARDIOVERSION N/A 06/07/2016   Procedure: TRANSESOPHAGEAL ECHOCARDIOGRAM (TEE);  Surgeon: Chrystie Nose, MD;  Location: [facility] ENDOSCOPY;  Service: Cardiovascular;  Laterality: N/A;  . TUBAL LIGATION      SOCIAL HISTORY:   Social History   Tobacco Use  . Smoking status: Former Smoker    Packs/day: 0.75    Types: Cigarettes    Last attempt to quit: 07/01/2015    Years since quitting: 2.6  . Smokeless tobacco: Never Used  Substance Use Topics  . Alcohol use: No    Alcohol/week: 0.0 standard drinks    FAMILY HISTORY:   Family History  Problem Relation Age of Onset  . CAD Unknown   . Diabetes Unknown   . Bipolar disorder Unknown   . Cervical cancer Mother   . Other Father        heart bypass  . Breast cancer Neg Hx     DRUG ALLERGIES:    Allergies  Allergen Reactions  . Cephalosporins Anaphylaxis    Patient has tolerated meropenem.   [name] Penicillins Anaphylaxis and Other (See Comments)    Has patient had a PCN reaction causing immediate rash, facial/tongue/throat swelling, SOB or lightheadedness with hypotension: Yes Has patient had a PCN reaction causing severe rash involving mucus membranes or skin necrosis: No Has patient had a PCN reaction that required hospitalization No Has patient had a PCN reaction occurring within the last 10 years: No If all of the above answers are "NO", then may proceed with Cephalosporin use.  . Reglan [Metoclopramide] Other (See Comments)    Severe EPS (lip, head, body tremors) March 2018  . Risperdal [Risperidone] Other (See Comments)    Severe EPS (lip, head, body tremors) March 2018  . Lamictal [Lamotrigine] Other (See Comments)    Reaction:  Hallucinations  . Phenergan [Promethazine Hcl] Nausea And Vomiting    Sensitivity to medicine  . Sulfamethoxazole-Trimethoprim     Other reaction(s): hyperkalemia  . Sulfasalazine Other (See Comments)  Reaction:  Unknown   . Pravastatin Other (See Comments)    Reaction:  Muscle pain   . Sulfa Antibiotics Other (See Comments)    Reaction:  Unknown     REVIEW OF SYSTEMS:  Review of system unobtainable as the patient is encephalopathic  MEDICATIONS AT HOME:   Prior to Admission medications   Medication Sig Start Date End Date Taking? Authorizing Provider  acetaminophen (TYLENOL) 500 MG tablet Take 500 mg by mouth every 6 (six) hours as needed for moderate pain or fever.    [provider]  albuterol (PROVENTIL) (2.5 MG/3ML) 0.083% nebulizer solution Take 2.5 mg by nebulization every 4 (four) hours as needed for wheezing or shortness of breath.    [provider]  amLODipine (NORVASC) 10 MG tablet Take 10 mg by mouth daily.     [provider]  aspirin 81 MG chewable tablet Chew 81 mg by mouth daily.    [provider]  atorvastatin (LIPITOR) 40 MG tablet Take 40 mg by mouth daily at 6 PM.    [provider]  benztropine (COGENTIN) 0.5 MG tablet Take 0.5 mg by mouth 2 (two) times daily.    [provider]  bisacodyl (DULCOLAX) 10 MG suppository Place 10 mg rectally as needed for moderate constipation.    [provider]  budesonide (PULMICORT) 0.5 MG/2ML nebulizer solution Take 2 mLs (0.5 mg total) by nebulization 2 (two) times daily. 08/30/17   Auburn Bilberry, MD  carboxymethylcellul-glycerin (OPTIVE) 0.5-0.9 % ophthalmic solution Apply to eye.    [provider]  carboxymethylcellulose (REFRESH) 1 % ophthalmic solution Place 1 drop into both eyes 3 (three) times daily.    [provider]  clonazePAM (KLONOPIN) 0.5 MG tablet Take  tablet (0.25MG ) by mouth every morning and 1 tablet (0.5MG ) by mouth every evening    [provider]  clopidogrel (PLAVIX) 75 MG tablet Take 75 mg by mouth daily at 12 noon.     [provider]  escitalopram (LEXAPRO) 5 MG tablet Take 5 mg by mouth daily.     [provider]  fluticasone (FLONASE) 50 MCG/ACT nasal spray Place 1 spray into both nostrils daily.     [provider]  gabapentin (NEURONTIN) 300 MG capsule Take 300 mg by mouth daily.     [provider]  hydrALAZINE (APRESOLINE) 50 MG tablet Take 1 tablet (50MG ) by mouth three times daily (Sunday, Tuesday, Thursday & Saturday) and take 1 tablet (50MG ) by mouth twice daily (Monday, Wednesday & Friday).    [provider]  HYDROcodone-acetaminophen (NORCO/VICODIN) 5-325 MG tablet Take 1 tablet by mouth every 6 (six) hours as needed for moderate pain or severe pain. 08/30/17   Auburn Bilberry, MD  insulin aspart (NOVOLOG) 100 UNIT/ML injection Inject 0-9 Units into the skin every 4 (four) hours. 08/30/17   Auburn Bilberry, MD  ipratropium-albuterol (DUONEB) 0.5-2.5 (3) MG/3ML SOLN Take 3 mLs by nebulization every 6 (six)  hours. 08/30/17   Auburn Bilberry, MD  isosorbide mononitrate (IMDUR) 30 MG 24 hr tablet Take 90 mg by mouth daily at 12 noon.     [provider]  lactulose (CHRONULAC) 10 GM/15ML solution Take 10 g by mouth daily at 6 PM.     [provider]  levETIRAcetam (KEPPRA) 750 MG tablet Take 2 tablets (1,500 mg total) by mouth daily at 12 noon. 05/20/17   Delfino Lovett, MD  lidocaine (LIDODERM) 5 % Place 1 patch onto the skin at bedtime.  [provider]  lidocaine (LMX) 4 % cream Apply 1 application topically daily as needed (pain). To right arm fistula    [provider]  lisinopril (PRINIVIL,ZESTRIL) 10 MG tablet Take 10 mg by mouth daily at 12 noon.    [provider]  Melatonin 3 MG TABS Take 3 mg by mouth at bedtime.    [provider]  metoprolol (LOPRESSOR) 50 MG tablet Take 1 tablet (50 mg total) by mouth 2 (two) times daily. 09/15/15   Milagros Loll, MD  multivitamin (ONE-A-DAY MEN'S) TABS tablet Take 1 tablet by mouth daily.    [provider]  nitroGLYCERIN (NITROSTAT) 0.4 MG SL tablet Place 0.4 mg under the tongue every 5 (five) minutes as needed for chest pain. Reported on 07/10/2015    [provider]  omeprazole (PRILOSEC) 20 MG capsule Take 20 mg by mouth 2 (two) times daily.     [provider]  ondansetron (ZOFRAN ODT) 4 MG disintegrating tablet Take 1 tablet (4 mg total) by mouth every 6 (six) hours as needed for nausea or vomiting. 07/16/15   Sharyn Creamer, MD  OXcarbazepine (TRILEPTAL) 150 MG tablet Take 150 mg by mouth at bedtime.    [provider]  polyethylene glycol powder (GLYCOLAX/MIRALAX) powder 1 cap full in a full glass of water, two times a day for 3 days. 09/06/17   Sharman Cheek, MD  senna (SENOKOT) 8.6 MG TABS tablet Take 2 tablets by mouth 2 (two) times daily.     [provider]  sevelamer carbonate (RENVELA) 800 MG tablet Take 800 mg by mouth 4 (four) times daily -  before  meals and at bedtime.     [provider]  Tiotropium Bromide Monohydrate (SPIRIVA RESPIMAT) 2.5 MCG/ACT AERS Inhale into the lungs.    [provider]  torsemide (DEMADEX) 100 MG tablet Take 100 mg by mouth daily.     [provider]  traZODone (DESYREL) 50 MG tablet Take 0.5 tablets (25 mg total) by mouth at bedtime as needed for sleep. 08/30/17   Auburn Bilberry, MD      VITAL SIGNS:  Blood pressure 140/65, pulse (!) 54, temperature 98 F (36.7 C), temperature source Oral, resp. rate 16, height 5\' 5"  (1.651 m), weight 113.4 kg, last menstrual period 03/16/2015, SpO2 99 %.  PHYSICAL EXAMINATION:  GENERAL:  52 y.o.-year-old patient lying in the bed with no acute distress.  EYES: Pupils equal, round, reactive to light and accommodation. No scleral icterus. Extraocular muscles intact.  HEENT: Head atraumatic, normocephalic. Oropharynx and nasopharynx clear.  NECK:  Supple, no jugular venous distention. No thyroid enlargement, no tenderness.  LUNGS: Normal breath sounds bilaterally, no wheezing, rales,rhonchi or crepitation. No use of accessory muscles of respiration.  CARDIOVASCULAR: S1, S2 normal. No murmurs, rubs, or gallops.  ABDOMEN: Soft, nontender, nondistended. Bowel sounds present EXTREMITIES: Right-sided BKA no pedal edema, cyanosis, or clubbing.  NEUROLOGIC: Disoriented. Sensation intact. Gait not checked.  PSYCHIATRIC: The patient is disoriented SKIN: No obvious rash, lesion, or ulcer.   LABORATORY PANEL:   CBC Recent Labs  Lab 02/24/18 1203  WBC 5.7  HGB 11.5*  HCT 39.3  PLT 162   ------------------------------------------------------------------------------------------------------------------  Chemistries  Recent Labs  Lab 02/24/18 1203  NA 138  K 4.3  CL 98  CO2 29  GLUCOSE 104*  BUN 18  CREATININE 5.38*  CALCIUM 8.9  AST 32  ALT 9  ALKPHOS 168*  BILITOT 0.8    ------------------------------------------------------------------------------------------------------------------  Cardiac Enzymes No  results for input(s): TROPONINI in the last 168 hours. ------------------------------------------------------------------------------------------------------------------  RADIOLOGY:  Dg Chest Portable 1 View  Result Date: 02/24/2018 CLINICAL DATA:  Altered mental status. EXAM: PORTABLE CHEST 1 VIEW COMPARISON:  Chest x-rays dated 08/28/2017 and 08/27/2017 FINDINGS: Cardiomegaly with bilateral perihilar hazy infiltrates consistent with pulmonary edema. There is suggestion of a small right pleural effusion. Pulmonary vascularity is at the upper limits of normal. No acute bone abnormality. IMPRESSION: Findings suggestive of mild pulmonary edema with a small right effusion. Electronically Signed   By: Francene Boyers M.D.   On: 02/24/2018 12:31    EKG:   Orders placed or performed during the hospital encounter of 02/24/18  . EKG 12-Lead  . EKG 12-Lead    IMPRESSION AND PLAN:     #Acute metabolic encephalopathy secondary to UTI Admit to MedSurg unit IV antibiotics levofloxacin as the patient is allergic to cephalosporins and penicillins with anaphylaxis Lactic acid is not elevated and patient is a dialysis patient, will hold off on IV fluids at this time Follow-up on the urine culture and sensitivity and de-escalate antibiotics Neurochecks  #End-stage renal disease on hemodialysis on Tuesday Thursday and Saturday Nephrology consult placed to Dr. Thedore Mins  #Diabetes mellitus Patient is lethargic will hold home medications and provide sliding scale insulin in the interim  #Essential hypertension Stable, resume home medications after med reconciliation  #GERD-PPI  All the records are reviewed and case discussed with ED provider. Management plans discussed with the patient, voicemail left to the patient's daughter Santo Held to call back for an  update CODE STATUS: fc until CODE STATUS clarified  TOTAL TIME TAKING CARE OF THIS PATIENT:43 minutes.   Note: This dictation was prepared with Dragon dictation along with smaller phrase technology. Any transcriptional errors that result from this process are unintentional.  Ramonita Lab M.D on 02/24/2018 at 2:22 PM  Between 7am to 6pm - Pager - [phone]  After 6pm go to [email or web address] - password [email or web address] [facility]  [place] [facility]  Office  [phone]  CC: Primary care physician; Dorothey Baseman, MD

## 2018-02-24 NOTE — Progress Notes (Signed)
Admission completed with patient's daughter. Daughter states patient's Abilify was changed on Nov 13 and patient has had altered mental status since this change. Daughter states her mental status change has progressively become worse. Macrobid was started about three weeks ago also for UTI and facility reported the patient has completed this regimen. Urine culture was sent on Sunday at [facility] and results were due back today or tomorrow. Patient's HD schedule was altered due to Holiday and then [facility] made an error in their transportation schedule that caused her to miss today. Daughter missed call from physician. Will page MD to alert to this note.

## 2018-02-24 NOTE — Care Management (Signed)
Patient is long term resident at [facility].  ESRD with chronic hemodialysis. Notified Dimas Chyle with [facility] of admission.

## 2018-02-24 NOTE — Progress Notes (Signed)
Changed Enoxaparin 40 mg SQ daily to q12h for BMI > 40.  Mauri Reading, PharmD Pharmacy Resident  02/24/2018 3:43 PM

## 2018-02-24 NOTE — ED Provider Notes (Signed)
[facility] Emergency Department Provider Note  ____________________________________________  Time seen: Approximately 1:24 PM  I have reviewed the triage vital signs and the nursing notes.   HISTORY  Chief Complaint Altered Mental Status  Level 5 Caveat: Portions of the History and Physical including HPI and review of systems are unable to be completely obtained due to patient being a poor historian due to altered mental status   HPI Kimberly Montgomery is a 52 y.o. female with a history of CAD, bipolar, CHF, end-stage renal disease on hemodialysis Tuesday Thursday Saturday, hypertension who comes to the ED today due to altered mental status for the past 4 days.  Her residential facility sent outpatient labs which they have not received results yet from, but she does have a urine test that shows signs of infection and preliminary identification of gram-negative rods and enterococcus, greater than 100,000 CFU's.      Past Medical History:  Diagnosis Date  . Anginal pain (HCC)   . Anxiety   . Bipolar disorder (HCC)   . CAD (coronary artery disease)   . CHF (congestive heart failure) (HCC)   . Chronic lower back pain   . Depression   . Endometriosis   . ESRD (end stage renal disease) on dialysis (HCC)    "[facility]; [address]; [place]; TTS" (01/19/2016)  . Gastroparesis   . GERD (gastroesophageal reflux disease)   . Heart attack (HCC) 08/2017   stent placed, [facility]  . History of blood transfusion "several"   "my blood would get low; low RBC"  . History of hiatal hernia   . HLD (hyperlipidemia)   . Hypertension   . Migraine    "monthly" (01/19/2016)  . Myocardial infarction (HCC) 2017   "~ 3 wks ago" (01/19/2016)  . Renal disorder   . Renal insufficiency   . Seizures (HCC) 07/2015   "I've only had the 1; don't know what from" (01/19/2016)  . Stroke (HCC)   . Type II diabetes mellitus [facility])      Patient Active Problem List    Diagnosis Date Noted  . Hyperkalemia 07/01/2017  . Pneumonia 05/15/2017  . Benign essential tremor 01/30/2017  . Seizure (HCC)   . Acute encephalopathy   . Tremor   . Cerebrovascular disease   . Complication from renal dialysis device 04/08/2016  . Colitis 03/13/2016  . Chronic diastolic congestive heart failure (HCC) 01/22/2016  . Pressure injury of skin 01/20/2016  . Bacteremia, coagulase-negative staphylococcal 12/27/2015  . Essential hypertension 11/21/2015  . Anemia of chronic disease 11/21/2015  . Acute respiratory failure with hypoxia (HCC) 11/21/2015  . ESRD on dialysis (HCC) 11/21/2015  . 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 NSTEMI (non-ST elevated myocardial infarction) (HCC) 11/20/2015  . Chest pain, rule out acute myocardial infarction 11/18/2015  . Bipolar I disorder, most recent episode depressed (HCC)   . Altered mental status 08/24/2015  . Ileus (HCC)   . Bipolar I disorder (HCC) 07/25/2015  . Seizures (HCC) 07/25/2015  . Peritonitis (HCC) 07/16/2015  . Unstable angina (HCC) 07/09/2015  . Accelerated hypertension 07/09/2015  . Type 2 diabetes mellitus (HCC) 07/09/2015  . CAD (coronary artery disease) 07/09/2015  . HLD (hyperlipidemia) 07/09/2015  . GERD (gastroesophageal reflux disease) 07/09/2015     Past Surgical History:  Procedure Laterality Date  . A/V FISTULAGRAM Left 11/27/2017   Procedure: A/V FISTULAGRAM;  Surgeon: Annice Needy, MD;  Location: [facility] INVASIVE CV LAB;  Service: Cardiovascular;  Laterality: Left;  . ABDOMINAL HYSTERECTOMY     "  partial"  . AV FISTULA PLACEMENT Right 08/23/2016   Procedure: ARTERIOVENOUS (AV) FISTULA CREATION  ( BRACHIAL CEPHALIC );  Surgeon: Renford Dills, MD;  Location: [facility] ORS;  Service: Vascular;  Laterality: Right;  . BELOW KNEE LEG AMPUTATION Right 2010?  [name] CARDIAC CATHETERIZATION Right 07/10/2015   Procedure: Left Heart Cath and Coronary Angiography;  Surgeon: Laurier Nancy, MD;  Location: [facility] INVASIVE CV LAB;   Service: Cardiovascular;  Laterality: Right;  . CARDIAC CATHETERIZATION Right 09/14/2015   Procedure: Left Heart Cath and Coronary Angiography;  Surgeon: Laurier Nancy, MD;  Location: [facility] INVASIVE CV LAB;  Service: Cardiovascular;  Laterality: Right;  . CARDIAC CATHETERIZATION N/A 09/14/2015   Procedure: Coronary Stent Intervention;  Surgeon: Alwyn Pea, MD;  Location: [facility] INVASIVE CV LAB;  Service: Cardiovascular;  Laterality: N/A;  . CARDIAC CATHETERIZATION Right 11/20/2015   Procedure: Left Heart Cath and Coronary Angiography;  Surgeon: Laurier Nancy, MD;  Location: [facility] INVASIVE CV LAB;  Service: Cardiovascular;  Laterality: Right;  . CATARACT EXTRACTION, BILATERAL    . CORONARY ANGIOPLASTY WITH STENT PLACEMENT  <2017   @ [facility]/notes 07/03/2013  . CORONARY ANGIOPLASTY WITH STENT PLACEMENT    . CORONARY STENT INTERVENTION N/A 08/28/2017   Procedure: CORONARY STENT INTERVENTION;  Surgeon: Alwyn Pea, MD;  Location: [facility] LAB;  Service: Cardiovascular;  Laterality: N/A;  . DIALYSIS/PERMA CATHETER REMOVAL N/A 11/12/2016   Procedure: DIALYSIS/PERMA CATHETER REMOVAL;  Surgeon: Renford Dills, MD;  Location: [facility];  Service: Cardiovascular;  Laterality: N/A;  . HYSTEROTOMY    . PERIPHERAL VASCULAR CATHETERIZATION N/A 08/30/2015   Procedure: Dialysis/Perma Catheter Insertion;  Surgeon: Annice Needy, MD;  Location: [facility] CV [facility];  Service: Cardiovascular;  Laterality: N/A;  . PERIPHERAL VASCULAR CATHETERIZATION N/A 01/01/2016   Procedure: Dialysis/Perma Catheter Insertion;  Surgeon: Annice Needy, MD;  Location: [facility] CV LAB;  Service: Cardiovascular;  Laterality: N/A;  . PERITONEAL CATHETER INSERTION  11/03/2013   [name] 11/03/2013  . PERITONEAL CATHETER REMOVAL  01/01/2016   "took the one from May out; put new PD cath in" (01/19/2016)  . PERITONEAL CATHETER REMOVAL  11/03/2013; 02/11/2014   [name] 11/03/2013; Removal of tunneled catheter/notes  02/11/2014  . RIGHT/LEFT HEART CATH AND CORONARY ANGIOGRAPHY N/A 08/28/2017   Procedure: RIGHT/LEFT HEART CATH AND CORONARY ANGIOGRAPHY;  Surgeon: Laurier Nancy, MD;  Location: [facility];  Service: Cardiovascular;  Laterality: N/A;  . SALPINGOOPHORECTOMY Right    [name] 07/03/2013  . TEE WITHOUT CARDIOVERSION N/A 06/07/2016   Procedure: TRANSESOPHAGEAL ECHOCARDIOGRAM (TEE);  Surgeon: Chrystie Nose, MD;  Location: [facility] ENDOSCOPY;  Service: Cardiovascular;  Laterality: N/A;  . TUBAL LIGATION       Prior to Admission medications   Medication Sig Start Date End Date Taking? Authorizing Provider  acetaminophen (TYLENOL) 500 MG tablet Take 500 mg by mouth every 6 (six) hours as needed for moderate pain or fever.    [provider]  albuterol (PROVENTIL) (2.5 MG/3ML) 0.083% nebulizer solution Take 2.5 mg by nebulization every 4 (four) hours as needed for wheezing or shortness of breath.    [provider]  amLODipine (NORVASC) 10 MG tablet Take 10 mg by mouth daily.     [provider]  aspirin 81 MG chewable tablet Chew 81 mg by mouth daily.    [provider]  atorvastatin (LIPITOR) 40 MG tablet Take 40 mg by mouth daily at 6 PM.    [provider]  benztropine (COGENTIN) 0.5 MG tablet Take 0.5 mg by mouth 2 (two) times daily.    [provider]  bisacodyl (DULCOLAX) 10 MG suppository Place 10 mg rectally as needed for moderate constipation.    [provider]  budesonide (PULMICORT) 0.5 MG/2ML nebulizer solution Take 2 mLs (0.5 mg total) by nebulization 2 (two) times daily. 08/30/17   Auburn Bilberry, MD  carboxymethylcellul-glycerin (OPTIVE) 0.5-0.9 % ophthalmic solution Apply to eye.    [provider]  carboxymethylcellulose (REFRESH) 1 % ophthalmic solution Place 1 drop into both eyes 3 (three) times daily.    [provider]  clonazePAM (KLONOPIN) 0.5 MG tablet Take  tablet (0.25MG ) by mouth every morning  and 1 tablet (0.5MG ) by mouth every evening    [provider]  clopidogrel (PLAVIX) 75 MG tablet Take 75 mg by mouth daily at 12 noon.     [provider]  escitalopram (LEXAPRO) 5 MG tablet Take 5 mg by mouth daily.     [provider]  fluticasone (FLONASE) 50 MCG/ACT nasal spray Place 1 spray into both nostrils daily.     [provider]  gabapentin (NEURONTIN) 300 MG capsule Take 300 mg by mouth daily.     [provider]  hydrALAZINE (APRESOLINE) 50 MG tablet Take 1 tablet (50MG ) by mouth three times daily (Sunday, Tuesday, Thursday & Saturday) and take 1 tablet (50MG ) by mouth twice daily (Monday, Wednesday & Friday).    [provider]  HYDROcodone-acetaminophen (NORCO/VICODIN) 5-325 MG tablet Take 1 tablet by mouth every 6 (six) hours as needed for moderate pain or severe pain. 08/30/17   Auburn Bilberry, MD  insulin aspart (NOVOLOG) 100 UNIT/ML injection Inject 0-9 Units into the skin every 4 (four) hours. 08/30/17   Auburn Bilberry, MD  ipratropium-albuterol (DUONEB) 0.5-2.5 (3) MG/3ML SOLN Take 3 mLs by nebulization every 6 (six) hours. 08/30/17   Auburn Bilberry, MD  isosorbide mononitrate (IMDUR) 30 MG 24 hr tablet Take 90 mg by mouth daily at 12 noon.     [provider]  lactulose (CHRONULAC) 10 GM/15ML solution Take 10 g by mouth daily at 6 PM.     [provider]  levETIRAcetam (KEPPRA) 750 MG tablet Take 2 tablets (1,500 mg total) by mouth daily at 12 noon. 05/20/17   Delfino Lovett, MD  lidocaine (LIDODERM) 5 % Place 1 patch onto the skin at bedtime.     [provider]  lidocaine (LMX) 4 % cream Apply 1 application topically daily as needed (pain). To right arm fistula    [provider]  lisinopril (PRINIVIL,ZESTRIL) 10 MG tablet Take 10 mg by mouth daily at 12 noon.    [provider]  Melatonin 3 MG TABS Take 3 mg by mouth at bedtime.    [provider]  metoprolol (LOPRESSOR) 50  MG tablet Take 1 tablet (50 mg total) by mouth 2 (two) times daily. 09/15/15   Milagros Loll, MD  multivitamin (ONE-A-DAY MEN'S) TABS tablet Take 1 tablet by mouth daily.    [provider]  nitroGLYCERIN (NITROSTAT) 0.4 MG SL tablet Place 0.4 mg under the tongue every 5 (five) minutes as needed for chest pain. Reported on 07/10/2015    [provider]  omeprazole (PRILOSEC) 20 MG capsule Take 20 mg by mouth 2 (two) times daily.     [provider]  ondansetron (ZOFRAN ODT) 4 MG disintegrating tablet Take 1 tablet (4 mg total) by mouth every 6 (six) hours as needed  for nausea or vomiting. 07/16/15   Sharyn Creamer, MD  OXcarbazepine (TRILEPTAL) 150 MG tablet Take 150 mg by mouth at bedtime.    [provider]  polyethylene glycol powder (GLYCOLAX/MIRALAX) powder 1 cap full in a full glass of water, two times a day for 3 days. 09/06/17   Sharman Cheek, MD  senna (SENOKOT) 8.6 MG TABS tablet Take 2 tablets by mouth 2 (two) times daily.     [provider]  sevelamer carbonate (RENVELA) 800 MG tablet Take 800 mg by mouth 4 (four) times daily -  before meals and at bedtime.     [provider]  Tiotropium Bromide Monohydrate (SPIRIVA RESPIMAT) 2.5 MCG/ACT AERS Inhale into the lungs.    [provider]  torsemide (DEMADEX) 100 MG tablet Take 100 mg by mouth daily.     [provider]  traZODone (DESYREL) 50 MG tablet Take 0.5 tablets (25 mg total) by mouth at bedtime as needed for sleep. 08/30/17   Auburn Bilberry, MD     Allergies Cephalosporins; Penicillins; Reglan [metoclopramide]; Risperdal [risperidone]; Lamictal [lamotrigine]; Phenergan [promethazine hcl]; Sulfamethoxazole-trimethoprim; Sulfasalazine; Pravastatin; and Sulfa antibiotics   Family History  Problem Relation Age of Onset  . CAD Unknown   . Diabetes Unknown   . Bipolar disorder Unknown   . Cervical cancer Mother   . Other Father        heart bypass  . Breast  cancer Neg Hx     Social History Social History   Tobacco Use  . Smoking status: Former Smoker    Packs/day: 0.75    Types: Cigarettes    Last attempt to quit: 07/01/2015    Years since quitting: 2.6  . Smokeless tobacco: Never Used  Substance Use Topics  . Alcohol use: No    Alcohol/week: 0.0 standard drinks  . Drug use: No    Review of Systems  Constitutional:   No fever or chills.  ENT:   No sore throat. No rhinorrhea. Cardiovascular:   No chest pain or syncope. Respiratory:   No dyspnea or cough. Gastrointestinal:   Negative for abdominal pain, vomiting and diarrhea.  Musculoskeletal:   Negative for focal pain or swelling All other systems reviewed and are negative except as documented above in ROS and HPI.  ____________________________________________   PHYSICAL EXAM:  VITAL SIGNS: ED Triage Vitals  Enc Vitals Group     BP 02/24/18 1154 134/70     Pulse Rate 02/24/18 1154 60     Resp 02/24/18 1154 18     Temp 02/24/18 1154 98 F (36.7 C)     Temp Source 02/24/18 1154 Oral     SpO2 02/24/18 1154 98 %     Weight 02/24/18 1155 250 lb (113.4 kg)     Height 02/24/18 1155 5\' 5"  (1.651 m)     Head Circumference --      Peak Flow --      Pain Score 02/24/18 1155 3     Pain Loc --      Pain Edu? --      Excl. in GC? --     Vital signs reviewed, nursing assessments reviewed.   Constitutional: Awake.  Not alert.  Not oriented.  Ill-appearing Eyes:   Conjunctivae are normal. EOMI. PERRL. ENT      Head:   Normocephalic and atraumatic.      Nose:   No congestion/rhinnorhea.       Mouth/Throat:   Dry mucous membranes, no pharyngeal erythema. No  peritonsillar mass.       Neck:   No meningismus. Full ROM. Hematological/Lymphatic/Immunilogical:   No cervical lymphadenopathy. Cardiovascular:   RRR. Symmetric bilateral radial and DP pulses.  No murmurs. Cap refill less than 2 seconds. Respiratory:   Normal respiratory effort without tachypnea/retractions. Breath  sounds are clear and equal bilaterally. No wheezes/rales/rhonchi. Gastrointestinal:   Soft and nontender. Non distended. There is no CVA tenderness.  No rebound, rigidity, or guarding.  Musculoskeletal:   Normal range of motion in all extremities. No joint effusions.  No lower extremity tenderness.  No edema. Neurologic:   Slow speech, limited language.  Motor grossly intact. Limited ability to follow commands Skin:    Skin is warm, dry and intact. No rash noted.  No petechiae, purpura, or bullae.  ____________________________________________    LABS (pertinent positives/negatives) (all labs ordered are listed, but only abnormal results are displayed) Labs Reviewed  COMPREHENSIVE METABOLIC PANEL - Abnormal; Notable for the following components:      Result Value   Glucose, Bld 104 (*)    Creatinine, Ser 5.38 (*)    Albumin 3.4 (*)    Alkaline Phosphatase 168 (*)    GFR calc non Af Amer 8 (*)    GFR calc Af Amer 10 (*)    All other components within normal limits  CBC - Abnormal; Notable for the following components:   Hemoglobin 11.5 (*)    MCHC 29.3 (*)    RDW 17.3 (*)    All other components within normal limits  URINALYSIS, COMPLETE (UACMP) WITH MICROSCOPIC - Abnormal; Notable for the following components:   Color, Urine YELLOW (*)    APPearance CLOUDY (*)    Glucose, UA 50 (*)    Hgb urine dipstick MODERATE (*)    Protein, ur 100 (*)    Leukocytes, UA LARGE (*)    WBC, UA >50 (*)    Bacteria, UA MANY (*)    All other components within normal limits  URINE CULTURE  CBG MONITORING, ED   ____________________________________________   EKG  Interpreted by me Sinus rhythm rate of 60, right axis, normal intervals.  Poor R wave progression.  Normal ST segments and T waves.  ____________________________________________    RADIOLOGY  Dg Chest Portable 1 View  Result Date: 02/24/2018 CLINICAL DATA:  Altered mental status. EXAM: PORTABLE CHEST 1 VIEW COMPARISON:   Chest x-rays dated 08/28/2017 and 08/27/2017 FINDINGS: Cardiomegaly with bilateral perihilar hazy infiltrates consistent with pulmonary edema. There is suggestion of a small right pleural effusion. Pulmonary vascularity is at the upper limits of normal. No acute bone abnormality. IMPRESSION: Findings suggestive of mild pulmonary edema with a small right effusion. Electronically Signed   By: Francene Boyers M.D.   On: 02/24/2018 12:31    ____________________________________________   PROCEDURES Procedures  ____________________________________________  DIFFERENTIAL DIAGNOSIS   UTI, electrolyte abnormality, dehydration  CLINICAL IMPRESSION / ASSESSMENT AND PLAN / ED COURSE  Pertinent labs & imaging results that were available during my care of the patient were reviewed by me and considered in my medical decision making (see chart for details).    Patient presents with altered mental status.  On exam she is somnolent and confused with very slow responsiveness.  Neurologic exam is nonfocal and I doubt stroke, but if metabolic work-up is negative, I would proceed with CT of her head.  Clinical Course as of Feb 25 1323  Tue Feb 24, 2018  1323 Urinalysis shows a clear urinary tract infection.  Preliminary identification  from outpatient lab shows enterococcus and gram-negative rods.  Patient started on IV Levaquin.  Due to her encephalopathy and severe comorbidities including dialysis dependent end-stage renal disease, I will admit the patient for further management.  Vital signs are normal, she is not septic.  Urinalysis, Complete w Microscopic(!) [PS]    Clinical Course User Index [PS] Sharman Cheek, MD     ----------------------------------------- 1:27 PM on 02/24/2018 -----------------------------------------  Discussed with hospitalist for further evaluation.  ____________________________________________   FINAL CLINICAL IMPRESSION(S) / ED DIAGNOSES    Final diagnoses:   Cystitis  Encephalopathy  End-stage renal disease on hemodialysis [facility])     ED Discharge Orders    None      Portions of this note were generated with dragon dictation software. Dictation errors may occur despite best attempts at proofreading.    Sharman Cheek, MD 02/24/18 1331

## 2018-02-25 LAB — CBC
HEMATOCRIT: 37.2 % (ref 36.0–46.0)
Hemoglobin: 11 g/dL — ABNORMAL LOW (ref 12.0–15.0)
MCH: 28.6 pg (ref 26.0–34.0)
MCHC: 29.6 g/dL — ABNORMAL LOW (ref 30.0–36.0)
MCV: 96.6 fL (ref 80.0–100.0)
NRBC: 0 % (ref 0.0–0.2)
Platelets: 146 10*3/uL — ABNORMAL LOW (ref 150–400)
RBC: 3.85 MIL/uL — AB (ref 3.87–5.11)
RDW: 17.2 % — ABNORMAL HIGH (ref 11.5–15.5)
WBC: 5.3 10*3/uL (ref 4.0–10.5)

## 2018-02-25 LAB — COMPREHENSIVE METABOLIC PANEL
ALT: 10 U/L (ref 0–44)
ANION GAP: 8 (ref 5–15)
AST: 30 U/L (ref 15–41)
Albumin: 3.1 g/dL — ABNORMAL LOW (ref 3.5–5.0)
Alkaline Phosphatase: 148 U/L — ABNORMAL HIGH (ref 38–126)
BUN: 25 mg/dL — ABNORMAL HIGH (ref 6–20)
CHLORIDE: 100 mmol/L (ref 98–111)
CO2: 31 mmol/L (ref 22–32)
Calcium: 8.5 mg/dL — ABNORMAL LOW (ref 8.9–10.3)
Creatinine, Ser: 5.94 mg/dL — ABNORMAL HIGH (ref 0.44–1.00)
GFR calc Af Amer: 9 mL/min — ABNORMAL LOW (ref >60)
GFR, EST NON AFRICAN AMERICAN: 7 mL/min — AB (ref >60)
Glucose, Bld: 114 mg/dL — ABNORMAL HIGH (ref 70–99)
POTASSIUM: 4.1 mmol/L (ref 3.5–5.1)
Sodium: 139 mmol/L (ref 135–145)
Total Bilirubin: 0.6 mg/dL (ref 0.3–1.2)
Total Protein: 6.2 g/dL — ABNORMAL LOW (ref 6.5–8.1)

## 2018-02-25 LAB — GLUCOSE, CAPILLARY
GLUCOSE-CAPILLARY: 113 mg/dL — AB (ref 70–99)
Glucose-Capillary: 146 mg/dL — ABNORMAL HIGH (ref 70–99)
Glucose-Capillary: 88 mg/dL (ref 70–99)
Glucose-Capillary: 139 mg/dL — ABNORMAL HIGH (ref 70–99)

## 2018-02-25 LAB — HEMOGLOBIN A1C
Hgb A1c MFr Bld: 5.4 % (ref 4.8–5.6)
MEAN PLASMA GLUCOSE: 108.28 mg/dL

## 2018-02-25 MED ORDER — MUPIROCIN 2 % EX OINT
TOPICAL_OINTMENT | Freq: Every day | CUTANEOUS | Status: DC
  Administered 2018-02-25 – 2018-02-26 (×2): via TOPICAL
  Filled 2018-02-25: qty 22

## 2018-02-25 MED ORDER — PRIMIDONE 50 MG PO TABS
50.0000 mg | ORAL_TABLET | Freq: Every day | ORAL | Status: DC
  Administered 2018-02-25: 50 mg via ORAL
  Filled 2018-02-25 (×2): qty 1

## 2018-02-25 MED ORDER — HYDROCODONE-ACETAMINOPHEN 5-325 MG PO TABS
1.0000 | ORAL_TABLET | Freq: Four times a day (QID) | ORAL | Status: DC | PRN
  Administered 2018-02-25 – 2018-02-26 (×3): 1 via ORAL
  Filled 2018-02-25 (×3): qty 1

## 2018-02-25 MED ORDER — LEVOFLOXACIN IN D5W 500 MG/100ML IV SOLN
500.0000 mg | INTRAVENOUS | Status: DC
  Filled 2018-02-25: qty 100

## 2018-02-25 MED ORDER — EPOETIN ALFA 4000 UNIT/ML IJ SOLN: 4000.0000 [IU] | Freq: Once | INTRAMUSCULAR | Status: DC

## 2018-02-25 MED ORDER — CHLORHEXIDINE GLUCONATE CLOTH 2 % EX PADS
6.0000 | MEDICATED_PAD | Freq: Every day | CUTANEOUS | Status: DC
  Administered 2018-02-25 – 2018-02-26 (×2): 6 via TOPICAL

## 2018-02-25 MED ORDER — LEVOFLOXACIN IN D5W 250 MG/50ML IV SOLN
250.0000 mg | INTRAVENOUS | Status: DC
  Administered 2018-02-25: 250 mg via INTRAVENOUS
  Filled 2018-02-25: qty 50

## 2018-02-25 MED ORDER — LEVETIRACETAM 750 MG PO TABS
1500.0000 mg | ORAL_TABLET | Freq: Two times a day (BID) | ORAL | Status: DC
  Administered 2018-02-25 – 2018-02-26 (×3): 1500 mg via ORAL
  Filled 2018-02-25 (×5): qty 2

## 2018-02-25 MED ORDER — EPOETIN ALFA 4000 UNIT/ML IJ SOLN
4000.0000 [IU] | INTRAMUSCULAR | Status: DC
  Administered 2018-02-25: 4000 [IU] via INTRAVENOUS
  Filled 2018-02-25: qty 1

## 2018-02-25 MED ORDER — HEPARIN SODIUM (PORCINE) 5000 UNIT/ML IJ SOLN
5000.0000 [IU] | Freq: Three times a day (TID) | INTRAMUSCULAR | Status: DC
  Administered 2018-02-25 – 2018-02-26 (×2): 5000 [IU] via SUBCUTANEOUS
  Filled 2018-02-25 (×2): qty 1

## 2018-02-25 MED ORDER — LEVOFLOXACIN 250 MG PO TABS
250.0000 mg | ORAL_TABLET | ORAL | Status: DC
  Filled 2018-02-25: qty 1

## 2018-02-25 NOTE — Progress Notes (Signed)
Enoxaparin 40 mg subcutaneously once daily has been changed to heparin 5000 units subcutaneously TID d/t ESRD on HD.  Yuriko Portales A. [place], [place].D., BCPS Clinical Pharmacist 02/25/2018 10:01

## 2018-02-25 NOTE — Progress Notes (Signed)
[facility], [place] 02/25/18  Subjective:   Patient known to our practice from previous admissions.  This time she presents for altered mental status.  [facility] for [facility] mental status for 3 to 4 days.  Urinalysis showed possible urinary tract infection.  Patient does not remember events leading up to this admission This morning, his speech is still slow but she is able to answer questions  Objective:  Vital signs in last 24 hours:  Temp:  [97.5 F (36.4 C)-98 F (36.7 C)] 97.5 F (36.4 C) (11/27 0433) Pulse Rate:  [48-60] 48 (11/27 0433) Resp:  [11-18] 16 (11/27 0433) BP: (118-140)/(29-70) 122/43 (11/27 0433) SpO2:  [93 %-100 %] 93 % (11/27 0433) Weight:  [113.4 kg] 113.4 kg (11/26 1155)  Weight change:  Filed Weights   02/24/18 1155  Weight: 113.4 kg    Intake/Output:    Intake/Output Summary (Last 24 hours) at 02/25/2018 4010 Last data filed at 02/25/2018 0700 Gross per 24 hour  Intake 180 ml  Output -  Net 180 ml     Physical Exam: General:  Chronically ill-appearing, lying in the bed  HEENT  moist oral mucous membranes  Neck  supple no masses  Pulm/lungs  normal breathing effort, clear  CVS/Heart  no rub or gallop  Abdomen:   Soft nontender  Extremities:  No edema  Neurologic:  Lethargic, speech is slow, able to answer questions, does not remember events from yesterday, generalized weakness  Skin:  Dry skin  Access:  Right brachiocephalic AV fistula       Basic Metabolic Panel:  Recent Labs  Lab 02/24/18 1203 02/25/18 0342  NA 138 139  K 4.3 4.1  CL 98 100  CO2 29 31  GLUCOSE 104* 114*  BUN 18 25*  CREATININE 5.38* 5.94*  CALCIUM 8.9 8.5*     CBC: Recent Labs  Lab 02/24/18 1203 02/25/18 0342  WBC 5.7 5.3  HGB 11.5* 11.0*  HCT 39.3 37.2  MCV 97.3 96.6  PLT 162 146*      Lab Results  Component Value Date   HEPBSAG Negative 06/01/2016      Microbiology:  Recent  Results (from the past 240 hour(s))  MRSA PCR Screening     Status: Abnormal   Collection Time: 02/24/18  3:51 PM  Result Value Ref Range Status   MRSA by PCR POSITIVE (A) NEGATIVE Final    Comment:        The GeneXpert MRSA Assay (FDA approved for NASAL specimens only), is one component of a comprehensive MRSA colonization surveillance program. It is not intended to diagnose MRSA infection nor to guide or monitor treatment for MRSA infections. RESULT CALLED TO, READ BACK BY AND VERIFIED WITH: [name] 02/24/18 @ 1719  MLK Performed at [facility], [address] [place], [place] [address]     Coagulation Studies: No results for input(s): LABPROT, INR in the last 72 hours.  Urinalysis: Recent Labs    02/24/18 1234  COLORURINE YELLOW*  LABSPEC 1.008  PHURINE 7.0  GLUCOSEU 50*  HGBUR MODERATE*  BILIRUBINUR NEGATIVE  KETONESUR NEGATIVE  PROTEINUR 100*  NITRITE NEGATIVE  LEUKOCYTESUR LARGE*      Imaging: Dg Chest Portable 1 View  Result Date: 02/24/2018 CLINICAL DATA:  Altered mental status. EXAM: PORTABLE CHEST 1 VIEW COMPARISON:  Chest x-rays dated 08/28/2017 and 08/27/2017 FINDINGS: Cardiomegaly with bilateral perihilar hazy infiltrates consistent with pulmonary edema. There is suggestion of a small  right pleural effusion. Pulmonary vascularity is at the upper limits of normal. No acute bone abnormality. IMPRESSION: Findings suggestive of mild pulmonary edema with a small right effusion. Electronically Signed   By: Francene Boyers M.D.   On: 02/24/2018 12:31     Medications:   . sodium chloride    . levofloxacin (LEVAQUIN) IV     . enoxaparin (LOVENOX) injection  40 mg Subcutaneous Q12H  . insulin aspart  0-5 Units Subcutaneous QHS  . insulin aspart  0-9 Units Subcutaneous TID WC  .  morphine injection  1 mg Intravenous Once  . sodium chloride flush  3 mL Intravenous Q12H   sodium chloride, acetaminophen **OR** acetaminophen,  HYDROcodone-acetaminophen, ondansetron **OR** ondansetron (ZOFRAN) IV, sodium chloride flush  Assessment/ Plan:  52 y.o.caucasian female with end stage renal disease on hemodialysis, COPD/asthma, diabetes mellitus type II, congestive heart failure, coronary artery disease. 08/22/17- admitted for altered mental status, later had myocardial infarction, s/p PCI to mid circumflex.   01/2018- admitted for altered mental status [facility]/ [facility] [address]/ MWF Right AVF  1. ESRD 2. AOCKD 3. SHPTH 4. Altered mental status  We will arrange for routine hemodialysis today.  Patient does not remember whether she went to the dialysis center on Monday or not.  Potassium level is within normal limits at 4.1.  Hemoglobin 11.0.  We will institute low-dose Epogen.  Patient is currently getting antibiotics empirically for urinary tract infection.  Mental status appears to be improving as she is more interactive compared to yesterday as described in the chart.     LOS: 1 Kimberly Montgomery 11/27/20199:23 AM  [facility] [place], [place] [phone]  Note: This note was prepared with Dragon dictation. Any transcription errors are unintentional

## 2018-02-25 NOTE — Progress Notes (Signed)
Pre HD Assessment    02/25/18 1235  Neurological  Level of Consciousness Alert  Orientation Level Oriented X4  Respiratory  Respiratory Pattern Regular;Unlabored;Symmetrical  Chest Assessment Chest expansion symmetrical  Bilateral Breath Sounds Clear  Cardiac  Pulse Regular  Heart Sounds No adventitious heart sounds  Jugular Venous Distention (JVD) No  ECG Monitor Yes  Cardiac Rhythm SB  Antiarrhythmic device No  Vascular  R Radial Pulse +2  L Radial Pulse +2  R Dorsalis Pedis Pulse Other (Comment) (UTA)  L Dorsalis Pedis Pulse +1  Edema Left lower extremity  LLE Edema Non-Pitting  Integumentary  Integumentary (WDL) X  Skin Color Appropriate for ethnicity  Skin Condition Dry  Skin Integrity Ecchymosis;Abrasion  Ecchymosis Location Leg  Ecchymosis Location Orientation Left  Musculoskeletal  Musculoskeletal (WDL) X  Generalized Weakness Yes  Gastrointestinal  Bowel Sounds Assessment Active  GU Assessment  Genitourinary (WDL) X (HD pt)  Psychosocial  Psychosocial (WDL) WDL

## 2018-02-25 NOTE — Progress Notes (Signed)
HD Treatment Initiated    02/25/18 1239  Vital Signs  Pulse Rate (!) 56  Resp 18  BP Location Right Arm  BP Method Automatic  Patient Position (if appropriate) Lying  Oxygen Therapy  SpO2 100 %  O2 Device Nasal Cannula  O2 Flow Rate (L/min) 2 L/min  During Hemodialysis Assessment  Blood Flow Rate (mL/min) 400 mL/min  Arterial Pressure (mmHg) -130 mmHg  Venous Pressure (mmHg) 180 mmHg  Transmembrane Pressure (mmHg) 40 mmHg  Ultrafiltration Rate (mL/min) 430 mL/min  Dialysate Flow Rate (mL/min) 600 ml/min  Conductivity: Machine  14.1  HD Safety Checks Performed Yes  Dialysis Fluid Bolus Normal Saline  Bolus Amount (mL) 250 mL  Intra-Hemodialysis Comments Progressing as prescribed  Fistula / Graft Right Upper arm Arteriovenous fistula  No Placement Date or Time found.   Placed prior to admission: Yes  Orientation: Right  Access Location: Upper arm  Access Type: Arteriovenous fistula  Status Accessed  Needle Size 15

## 2018-02-25 NOTE — Plan of Care (Signed)
Pt progressing to goals for discharge.  

## 2018-02-25 NOTE — Progress Notes (Signed)
Pre HD Treatment    02/25/18 1230  Vital Signs  Temp 98.4 F (36.9 C)  Temp Source Oral  Pulse Rate (!) 56  Pulse Rate Source Monitor  Resp 12  BP (!) 149/59  BP Location Right Arm  BP Method Automatic  Patient Position (if appropriate) Lying  Oxygen Therapy  SpO2 99 %  O2 Device Nasal Cannula  O2 Flow Rate (L/min) 2 L/min  Pain Assessment  Pain Scale 0-10  Pain Score 0  Dialysis Weight  Weight 106.5 kg  Type of Weight Pre-Dialysis  Time-Out for Hemodialysis  What Procedure? HD  Pt Identifiers(min of two) First/Last Name;MRN/Account#;Pt's DOB(use if MRN/Acct# not available  Correct Site? Yes  Correct Side? Yes  Correct Procedure? Yes  Consents Verified? Yes  Rad Studies Available? N/A  Safety Precautions Reviewed? Yes  Biochemist, clinical Number 5  Station Number 2  UF/Alarm Test Passed  Conductivity: Meter 14  Conductivity: Machine  14.2  pH 7.6  Reverse Osmosis Main  Normal Saline Lot Number [id number]  Dialyzer Lot Number [id number]  Disposable Set Lot Number [id number]  Machine Temperature 98.6 F (37 C)  Immunologist and Audible Yes  Blood Lines Intact and Secured Yes  Pre Treatment Patient Checks  Vascular access used during treatment Fistula  Hepatitis B Surface Antigen Results Negative  Date Hepatitis B Surface Antigen Drawn 01/12/18  Isolation Initiated Yes  Hepatitis B Surface Antibody  (>10)  Date Hepatitis B Surface Antibody Drawn 01/12/18  Hemodialysis Consent Verified Yes  Hemodialysis Standing Orders Initiated Yes  ECG (Telemetry) Monitor On Yes  Prime Ordered Normal Saline  Length of  DialysisTreatment -hour(s) 3.5 Hour(s)  Dialyzer Elisio 17H NR  Dialysate 2K, 2.5 Ca  Variable Sodium Other (Comment)  Dialysis Anticoagulant None  Dialysate Flow Ordered 600  Blood Flow Rate Ordered 400 mL/min  Ultrafiltration Goal 1 Liters  Dialysis Blood Pressure Support Ordered Normal Saline  Education / Care Plan  Dialysis Education Provided Yes   Documented Education in Care Plan Yes  Fistula / Graft Right Upper arm Arteriovenous fistula  No Placement Date or Time found.   Placed prior to admission: Yes  Orientation: Right  Access Location: Upper arm  Access Type: Arteriovenous fistula  Site Condition No complications  Fistula / Graft Assessment Present;Thrill;Bruit  Drainage Description None

## 2018-02-25 NOTE — Progress Notes (Signed)
[facility] [place] [facility]      PATIENT NAME: Kimberly Montgomery    MR#:  [id number]  DATE OF BIRTH:  1965-11-29  SUBJECTIVE:   Patient admitted to the hospital secondary to altered mental status.  Mental status slightly improved since yesterday.  Patient follows commands and answers questions appropriately but later slowed response.  Unclear what her baseline is.  Plan for hemodialysis later today.  REVIEW OF SYSTEMS:    Review of Systems  Constitutional: Negative for chills and fever.  HENT: Negative for congestion and tinnitus.   Eyes: Negative for blurred vision and double vision.  Respiratory: Negative for cough, shortness of breath and wheezing.   Cardiovascular: Negative for chest pain, orthopnea and PND.  Gastrointestinal: Negative for abdominal pain, diarrhea, nausea and vomiting.  Genitourinary: Negative for dysuria and hematuria.  Neurological: Positive for weakness (generalized). Negative for dizziness, sensory change and focal weakness.  All other systems reviewed and are negative.   Nutrition: Renal/carb modified Tolerating Diet: yes Tolerating PT: Bedbound.   DRUG ALLERGIES:   Allergies  Allergen Reactions  . Cephalosporins Anaphylaxis    Patient has tolerated meropenem.   [name] Penicillins Anaphylaxis and Other (See Comments)    Has patient had a PCN reaction causing immediate rash, facial/tongue/throat swelling, SOB or lightheadedness with hypotension: Yes Has patient had a PCN reaction causing severe rash involving mucus membranes or skin necrosis: No Has patient had a PCN reaction that required hospitalization No Has patient had a PCN reaction occurring within the last 10 years: No If all of the above answers are "NO", then may proceed with Cephalosporin use.  . Reglan [Metoclopramide] Other (See Comments)    Severe EPS (lip, head, body tremors) March 2018  . Risperdal [Risperidone] Other (See Comments)    Severe EPS (lip, head, body  tremors) March 2018  . Lamictal [Lamotrigine] Other (See Comments)    Reaction:  Hallucinations  . Phenergan [Promethazine Hcl] Nausea And Vomiting    Sensitivity to medicine  . Sulfamethoxazole-Trimethoprim     Other reaction(s): hyperkalemia  . Sulfasalazine Other (See Comments)    Reaction:  Unknown   . Pravastatin Other (See Comments)    Reaction:  Muscle pain   . Sulfa Antibiotics Other (See Comments)    Reaction:  Unknown     VITALS:  Blood pressure (!) 151/53, pulse (!) 57, temperature 98.4 F (36.9 C), temperature source Oral, resp. rate 12, height 5\' 5"  (1.651 m), weight 106.5 kg, last menstrual period 03/16/2015, SpO2 100 %.  PHYSICAL EXAMINATION:   Physical Exam  GENERAL:  52 y.o.-year-old patient lying in bed lethargic but follows simple commands and slow to respond to questions.   EYES: Pupils equal, round, reactive to light and accommodation. No scleral icterus. Extraocular muscles intact.  HEENT: Head atraumatic, normocephalic. Oropharynx and nasopharynx clear.  NECK:  Supple, no jugular venous distention. No thyroid enlargement, no tenderness.  LUNGS: Normal breath sounds bilaterally, no wheezing, rales, rhonchi. No use of accessory muscles of respiration.  CARDIOVASCULAR: S1, S2 normal. No murmurs, rubs, or gallops.  ABDOMEN: Soft, nontender, nondistended. Bowel sounds present. No organomegaly or mass.  EXTREMITIES: No cyanosis, clubbing or edema b/l. Right BKA.  NEUROLOGIC: Cranial nerves II through XII are intact. No focal Motor or sensory deficits b/l. Globally weak.   PSYCHIATRIC: The patient is alert and oriented x 3.  SKIN: No obvious rash, lesion, or ulcer.   Right upper extremity AV fistula with good bruit and thrill.  LABORATORY  PANEL:   CBC Recent Labs  Lab 02/25/18 0342  WBC 5.3  HGB 11.0*  HCT 37.2  PLT 146*   ------------------------------------------------------------------------------------------------------------------  Chemistries    Recent Labs  Lab 02/25/18 0342  NA 139  K 4.1  CL 100  CO2 31  GLUCOSE 114*  BUN 25*  CREATININE 5.94*  CALCIUM 8.5*  AST 30  ALT 10  ALKPHOS 148*  BILITOT 0.6   ------------------------------------------------------------------------------------------------------------------  Cardiac Enzymes No results for input(s): TROPONINI in the last 168 hours. ------------------------------------------------------------------------------------------------------------------  RADIOLOGY:  Dg Chest Portable 1 View  Result Date: 02/24/2018 CLINICAL DATA:  Altered mental status. EXAM: PORTABLE CHEST 1 VIEW COMPARISON:  Chest x-rays dated 08/28/2017 and 08/27/2017 FINDINGS: Cardiomegaly with bilateral perihilar hazy infiltrates consistent with pulmonary edema. There is suggestion of a small right pleural effusion. Pulmonary vascularity is at the upper limits of normal. No acute bone abnormality. IMPRESSION: Findings suggestive of mild pulmonary edema with a small right effusion. Electronically Signed   By: Francene Boyers M.D.   On: 02/24/2018 12:31     ASSESSMENT AND PLAN:   52 year old female with history of end-stage renal disease on hemodialysis, HTN, Hyperlipidemia, DM type II, Anxiety/Bipolar disorder, GERD, hx of previous CVA, Seizures came into hospital due to Altered mental status.   1.  Altered mental status-etiology unclear but suspected to be secondary to a UTI. - Continue Levaquin for the UTI, follow mental status which is slowly improving.  2.  Urinary tract infection-based off a urinalysis on admission.  Patient apparently is a dialysis patient therefore her urine is likely colonized.  Patient has no fever, leukocytosis. -We will empirically treat with Levaquin x3 doses then stop.  3.  End-stage renal disease on hemodialysis- nephrology has been consulted, plan for dialysis today.  Patient is on a Monday Wednesday Friday schedule.  4.  History of seizures-continue Keppra.   Next  5.  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  Blood sugar stable.  6. Anxiety - cont. Primidone.   All the records are reviewed and case discussed with Care Management/Social Worker. Management plans discussed with the patient, family and they are in agreement.  CODE STATUS: Full code  DVT Prophylaxis: Hep SQ  TOTAL TIME TAKING CARE OF THIS PATIENT: 30 minutes.   POSSIBLE D/C IN 1-2 DAYS, DEPENDING ON CLINICAL CONDITION.   Houston Siren M.D on 02/25/2018 at 2:45 PM  Between 7am to 6pm - Pager - [phone]  After 6pm go to [email or web address] - Social research officer, government  [facility]  Office  [phone]  CC: Primary care physician; Dorothey Baseman, MD

## 2018-02-25 NOTE — Consult Note (Signed)
WOC Nurse wound consult note Reason for Consult: Deep tissue injury to coccyx Blistering, partial thickness skin loss to left lateral malleolus and left lateral foot Wound type:pressure Pressure Injury POA: Yes Measurement:Coccyx to left gluteal:  1 cm x 1 cm maroon discoloration with 0.2 cm nonintact center LEft malleolus:  Blister 2 cm x 2 cm x 0.1 cm  LEft lateral foot: 0.5 cm x 0.3 cm x 0.1 cm  Wound OPF:YTWKMQK epithelium Drainage (amount, consistency, odor) minimal serosanguinous  No odor Periwound:intact Dressing procedure/placement/frequency:Avoid disposable briefs and underpads while in bed.  Silicone foam to sacral wound and left foot wounds.  Apply mupirocin ointment to left foot wounds Change foam dressings every three days.  Will not follow at this time.  Please re-consult if needed.  Maple Hudson MSN, RN, FNP-BC CWON Wound, Ostomy, Continence Nurse Pager [phone]

## 2018-02-25 NOTE — Progress Notes (Signed)
Post HD Treatment   Pt tolerated treatment well. Her BVP ws 75.5 and her NET UF was 1000. All blood was returned to patient. Report called and given to primary RN Amanada. No complaints noted.     02/25/18 1630  Hand-Off documentation  Report given to (Full Name) Marchelle Folks, RN  Report received from (Full Name) Felecia Jan, RN  Vital Signs  Temp 98 F (36.7 C)  Temp Source Oral  Pulse Rate 69  Pulse Rate Source Monitor  Resp 16  BP (!) 166/52  BP Location Left Arm  BP Method Automatic  Patient Position (if appropriate) Lying  Oxygen Therapy  SpO2 100 %  O2 Device Nasal Cannula  O2 Flow Rate (L/min) 2 L/min  Pain Assessment  Pain Scale 0-10  Pain Score 0  Dialysis Weight  Weight 106 kg  Type of Weight Post-Dialysis  Post-Hemodialysis Assessment  Rinseback Volume (mL) 250 mL  KECN 257 V  Dialyzer Clearance Lightly streaked  Duration of HD Treatment -hour(s) 3.5 hour(s)  Hemodialysis Intake (mL) 500 mL  UF Total -Machine (mL) 1500 mL  Net UF (mL) 1000 mL  Tolerated HD Treatment Yes  AVG/AVF Arterial Site Held (minutes) 15 minutes  AVG/AVF Venous Site Held (minutes) 10 minutes  Fistula / Graft Right Upper arm Arteriovenous fistula  No Placement Date or Time found.   Placed prior to admission: Yes  Orientation: Right  Access Location: Upper arm  Access Type: Arteriovenous fistula  Site Condition No complications  Fistula / Graft Assessment Present;Thrill;Bruit  Drainage Description None

## 2018-02-25 NOTE — Progress Notes (Signed)
HD Treatment Complete    02/25/18 1622  During Hemodialysis Assessment  Blood Flow Rate (mL/min) 400 mL/min  Arterial Pressure (mmHg) -180 mmHg  Venous Pressure (mmHg) 170 mmHg  Transmembrane Pressure (mmHg) 50 mmHg  Ultrafiltration Rate (mL/min) 450 mL/min  Dialysate Flow Rate (mL/min) 600 ml/min  Conductivity: Machine  13.6  HD Safety Checks Performed Yes  Intra-Hemodialysis Comments Progressing as prescribed;Tolerated well;Tx completed (1500)  Fistula / Graft Right Upper arm Arteriovenous fistula  No Placement Date or Time found.   Placed prior to admission: Yes  Orientation: Right  Access Location: Upper arm  Access Type: Arteriovenous fistula  Status Deaccessed

## 2018-02-26 LAB — URINE CULTURE: Culture: >=100000 — AB

## 2018-02-26 LAB — CBC
HCT: 39.5 % (ref 36.0–46.0)
HEMOGLOBIN: 11.7 g/dL — AB (ref 12.0–15.0)
MCH: 28.7 pg (ref 26.0–34.0)
MCHC: 29.6 g/dL — AB (ref 30.0–36.0)
MCV: 96.8 fL (ref 80.0–100.0)
Platelets: 144 10*3/uL — ABNORMAL LOW (ref 150–400)
RBC: 4.08 MIL/uL (ref 3.87–5.11)
RDW: 17 % — AB (ref 11.5–15.5)
WBC: 5.2 10*3/uL (ref 4.0–10.5)
nRBC: 0 % (ref 0.0–0.2)

## 2018-02-26 LAB — GLUCOSE, CAPILLARY
Glucose-Capillary: 129 mg/dL — ABNORMAL HIGH (ref 70–99)
Glucose-Capillary: 149 mg/dL — ABNORMAL HIGH (ref 70–99)

## 2018-02-26 MED ORDER — HYDROCODONE-ACETAMINOPHEN 5-325 MG PO TABS: 1.0000 | ORAL_TABLET | Freq: Four times a day (QID) | ORAL | 0 refills | Status: DC | PRN

## 2018-02-26 MED ORDER — LEVOFLOXACIN 250 MG PO TABS: 250.0000 mg | ORAL_TABLET | ORAL | 0 refills | Status: AC

## 2018-02-26 NOTE — Progress Notes (Signed)
Pt being discharged to [facility], discharge instructions and report given to [facility], pt with no complaints

## 2018-02-26 NOTE — Clinical Social Work Note (Addendum)
Patient to be d/c'ed today to [facility] room 101B.  Patient and family agreeable to plans will transport via ems RN to call report [phone].  Formal assessment to follow.  Windell Moulding, MSW, [name] [phone]

## 2018-02-26 NOTE — Clinical Social Work Note (Signed)
Clinical Social Work Assessment  Patient Details  Name: Kimberly Montgomery MRN: [id number] Date of Birth: October 11, 1965  Date of referral:  02/26/18               Reason for consult:  Facility Placement                Permission sought to share information with:  Family Supports, Magazine features editor Permission granted to share information::  Yes, Verbal Permission Granted  Name::     Sheppard Coil Daughter [phone]   Agency::  SNF admissions  Relationship::     Contact Information:     Housing/Transportation Living arrangements for the past 2 months:  Skilled Nursing Facility Source of Information:  Patient Patient Interpreter Needed:  None Criminal Activity/Legal Involvement Pertinent to Current Situation/Hospitalization:  No - Comment as needed Significant Relationships:  None Lives with:  Facility Resident Do you feel safe going back to the place where you live?  Yes Need for family participation in patient care:  No (Coment)  Care giving concerns: Patient did not express any concerns about returning back to SNF.   Social Worker assessment / plan: Patient is a 52 year old female who is alert and oriented x4.  Patient is a long term care resident at [facility] of [address]  Patient states she has been there for about 6 months.  Patient stated things are going okay overall, she is satisfied with care at [facility]  Patient was explained role of CSW and process for facilitating discharge back to [facility].  Patient did not express any other questions or concerns.  Employment status:  Disabled (Comment on whether or not currently receiving Disability) Insurance information:  Teacher, English as a foreign language, Medicaid In [place] PT Recommendations:  Not assessed at this time Information / Referral to community resources:     Patient/Family's Response to care:  Patient is in agreemment to returning back to SNF.  Patient/Family's Understanding of and Emotional Response to Diagnosis,  Current Treatment, and Prognosis:  Patient is aware of current treatment plan and diagnosis.  Emotional Assessment Appearance:  Appears stated age Attitude/Demeanor/Rapport:    Affect (typically observed):  Calm, Stable Orientation:  Oriented to Self, Oriented to Place, Oriented to  Time, Oriented to Situation Alcohol / Substance use:  Not Applicable Psych involvement (Current and /or in the community):  No (Comment)  Discharge Needs  Concerns to be addressed:  Lack of Support, Care Coordination Readmission within the last 30 days:  No Current discharge risk:  None Barriers to Discharge:  No Barriers Identified   Darleene Cleaver, LCSWA 02/26/2018, 12:26 PM

## 2018-02-26 NOTE — Progress Notes (Signed)
[facility] [place], [place] 02/26/18  Subjective:   Tolerated her dialysis well yesterday. Mental status appears to be improved compared to yesterday but not back to baseline Urinary tract infection from Klebsiella  Objective:  Vital signs in last 24 hours:  Temp:  [97.6 F (36.4 C)-98.4 F (36.9 C)] 98.4 F (36.9 C) (11/28 0507) Pulse Rate:  [55-69] 61 (11/28 0507) Resp:  [12-20] 19 (11/28 0507) BP: (121-172)/(41-94) 161/94 (11/28 0507) SpO2:  [93 %-100 %] 93 % (11/28 0507) Weight:  [106 kg-106.5 kg] 106 kg (11/27 1630)  Weight change: -6.899 kg Filed Weights   02/24/18 1155 02/25/18 1230 02/25/18 1630  Weight: 113.4 kg 106.5 kg 106 kg    Intake/Output:    Intake/Output Summary (Last 24 hours) at 02/26/2018 1105 Last data filed at 02/25/2018 1630 Gross per 24 hour  Intake 50 ml  Output 1000 ml  Net -950 ml     Physical Exam: General:  Chronically ill-appearing, lying in the bed  HEENT  moist oral mucous membranes  Neck  supple no masses  Pulm/lungs  normal breathing effort, clear  CVS/Heart  no rub or gallop  Abdomen:   Soft nontender  Extremities:  No edema, right BKA  Neurologic:   speech is slow, but improved.  able to answer questions, gaps in memory noted. Requires reorientation  Skin:  Dry skin  Access:  Right brachiocephalic AV fistula       Basic Metabolic Panel:  Recent Labs  Lab 02/24/18 1203 02/25/18 0342  NA 138 139  K 4.3 4.1  CL 98 100  CO2 29 31  GLUCOSE 104* 114*  BUN 18 25*  CREATININE 5.38* 5.94*  CALCIUM 8.9 8.5*     CBC: Recent Labs  Lab 02/24/18 1203 02/25/18 0342 02/26/18 0343  WBC 5.7 5.3 5.2  HGB 11.5* 11.0* 11.7*  HCT 39.3 37.2 39.5  MCV 97.3 96.6 96.8  PLT 162 146* 144*      Lab Results  Component Value Date   HEPBSAG Negative 06/01/2016      Microbiology:  Recent Results (from the past 240 hour(s))  Urine culture     Status: Abnormal   Collection Time: 02/24/18 12:35 PM   Result Value Ref Range Status   Specimen Description   Final    URINE, RANDOM Performed at [facility], [address], [place], [place] [address]    Special Requests   Final    NONE Performed at [facility], [address] [place], [place] [address]    Culture >=100,000 COLONIES/mL KLEBSIELLA PNEUMONIAE (A)  Final   Report Status 02/26/2018 FINAL  Final   Organism ID, Bacteria KLEBSIELLA PNEUMONIAE (A)  Final      Susceptibility   Klebsiella pneumoniae - MIC*    AMPICILLIN >=32 RESISTANT Resistant     CEFAZOLIN <=4 SENSITIVE Sensitive     CEFTRIAXONE <=1 SENSITIVE Sensitive     CIPROFLOXACIN <=0.25 SENSITIVE Sensitive     GENTAMICIN <=1 SENSITIVE Sensitive     IMIPENEM <=0.25 SENSITIVE Sensitive     NITROFURANTOIN 64 INTERMEDIATE Intermediate     TRIMETH/SULFA <=20 SENSITIVE Sensitive     AMPICILLIN/SULBACTAM 4 SENSITIVE Sensitive     PIP/TAZO <=4 SENSITIVE Sensitive     Extended ESBL NEGATIVE Sensitive     * >=100,000 COLONIES/mL KLEBSIELLA PNEUMONIAE  MRSA PCR Screening     Status: Abnormal   Collection Time: 02/24/18  3:51 PM  Result Value Ref Range Status   MRSA by PCR POSITIVE (  A) NEGATIVE Final    Comment:        The GeneXpert MRSA Assay (FDA approved for NASAL specimens only), is one component of a comprehensive MRSA colonization surveillance program. It is not intended to diagnose MRSA infection nor to guide or monitor treatment for MRSA infections. RESULT CALLED TO, READ BACK BY AND VERIFIED WITH: [name] 02/24/18 @ 1719  MLK Performed at [facility], [address] [place], [place] [address]     Coagulation Studies: No results for input(s): LABPROT, INR in the last 72 hours.  Urinalysis: Recent Labs    02/24/18 1234  COLORURINE YELLOW*  LABSPEC 1.008  PHURINE 7.0  GLUCOSEU 50*  HGBUR MODERATE*  BILIRUBINUR NEGATIVE  KETONESUR NEGATIVE  PROTEINUR 100*  NITRITE NEGATIVE  LEUKOCYTESUR LARGE*       Imaging: Dg Chest Portable 1 View  Result Date: 02/24/2018 CLINICAL DATA:  Altered mental status. EXAM: PORTABLE CHEST 1 VIEW COMPARISON:  Chest x-rays dated 08/28/2017 and 08/27/2017 FINDINGS: Cardiomegaly with bilateral perihilar hazy infiltrates consistent with pulmonary edema. There is suggestion of a small right pleural effusion. Pulmonary vascularity is at the upper limits of normal. No acute bone abnormality. IMPRESSION: Findings suggestive of mild pulmonary edema with a small right effusion. Electronically Signed   By: Francene Boyers M.D.   On: 02/24/2018 12:31     Medications:   . sodium chloride     . Chlorhexidine Gluconate Cloth  6 each Topical Q0600  . epoetin (EPOGEN/PROCRIT) injection  4,000 Units Intravenous Q M,W,F-HD  . heparin injection (subcutaneous)  5,000 Units Subcutaneous Q8H  . insulin aspart  0-5 Units Subcutaneous QHS  . insulin aspart  0-9 Units Subcutaneous TID WC  . levETIRAcetam  1,500 mg Oral BID  . [START ON 02/27/2018] levofloxacin  250 mg Oral Q48H  .  morphine injection  1 mg Intravenous Once  . mupirocin ointment   Topical Daily  . primidone  50 mg Oral QHS  . sodium chloride flush  3 mL Intravenous Q12H   sodium chloride, acetaminophen **OR** acetaminophen, HYDROcodone-acetaminophen, ondansetron **OR** ondansetron (ZOFRAN) IV, sodium chloride flush  Assessment/ Plan:  52 y.o.caucasian female with end stage renal disease on hemodialysis, COPD/asthma, diabetes mellitus type II, congestive heart failure, coronary artery disease. 08/22/17- admitted for altered mental status, later had myocardial infarction, s/p PCI to mid circumflex.   01/2018- admitted for altered mental status- UTI [facility]/ [address]/ MWF Right AVF  1. ESRD 2. AOCKD 3. SHPTH 4. Altered mental status 5.  Klebsiella pneumonia UTI  She tolerated her dialysis well yesterday.  1000 cc of fluid was removed. Urinary tract infection is being treated with  Levaquin.  Probably the cause of her altered mental status Asked hemodialysis tomorrow if still in the hospital     LOS: 2 Lamount Bankson Thedore Mins 11/28/201911:05 AM  [facility] [place], [place] [phone]  Note: This note was prepared with Dragon dictation. Any transcription errors are unintentional

## 2018-02-26 NOTE — Care Management (Signed)
Amanda Morris dialysis liaison notified of discharge  

## 2018-02-26 NOTE — Discharge Summary (Signed)
[facility] - [place] at [facility]   PATIENT NAME: Kimberly Montgomery    MR#:  [id number]  DATE OF BIRTH:  [birth year]  DATE OF ADMISSION:  02/24/2018 ADMITTING PHYSICIAN: Ramonita Lab, MD  DATE OF DISCHARGE: 02/26/2018  PRIMARY CARE PHYSICIAN: Dorothey Baseman, MD    ADMISSION DIAGNOSIS:  Encephalopathy [G93.40] End-stage renal disease on hemodialysis (HCC) [N18.6, Z99.2] Cystitis [N30.90]  DISCHARGE DIAGNOSIS:  Active Problems:   Acute encephalopathy   SECONDARY DIAGNOSIS:   Past Medical History:  Diagnosis Date  . Anginal pain (HCC)   . Anxiety   . Bipolar disorder (HCC)   . CAD (coronary artery disease)   . CHF (congestive heart failure) (HCC)   . Chronic lower back pain   . Depression   . Endometriosis   . ESRD (end stage renal disease) on dialysis (HCC)    "[facility]; [address]; [place]; TTS" (01/19/2016)  . Gastroparesis   . GERD (gastroesophageal reflux disease)   . Heart attack (HCC) 08/2017   stent placed, [facility]  . History of blood transfusion "several"   "my blood would get low; low RBC"  . History of hiatal hernia   . HLD (hyperlipidemia)   . Hypertension   . Migraine    "monthly" (01/19/2016)  . Myocardial infarction (HCC) 2017   "~ 3 wks ago" (01/19/2016)  . Renal disorder   . Renal insufficiency   . Seizures (HCC) 07/2015   "I've only had the 1; don't know what from" (01/19/2016)  . Stroke (HCC)   . Type II diabetes mellitus [facility])     HOSPITAL COURSE:   52 year old female with history of end-stage renal disease on hemodialysis, HTN, Hyperlipidemia, DM type II, Anxiety/Bipolar disorder, GERD, hx of previous CVA, Seizures came into hospital due to Altered mental status.   1.  Altered mental status- secondary to polypharmacy and also due to UTI. -Patient was treated for UTI with IV ceftriaxone then switched over to oral Levaquin and she is being discharged on oral Levaquin. -Patient's urine cultures are positive for  Klebsiella pneumonia and it sensitive to ciprofloxacin.    2.  Urinary tract infection-based off a urinalysis on admission.  His urine cultures were positive for 100,000 colonies of Klebsiella pneumoniae. -Patient was treated IV ceftriaxone and switched over to oral Levaquin and now being discharged on oral Levaquin.    3.  End-stage renal disease on hemodialysis- neurology was consulted and patient was maintained on her Monday Wednesday Friday schedule dialysis and will resume that upon discharge.    4.  History of seizures- no acute seizures while in the hospital, patient will continue her Keppra. -Patient will also continue her Trileptal  5.  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  6. Anxiety/depression -Patient will continue her Klonopin, Abilify, Lexapro, Celexa  7.  Essential hypertension- initially while in the hospital patient's blood pressure meds were held due to the altered mental status and lack of hypotension.  She will resume all her antihypertensives including lisinopril, hydralazine, metoprolol, torsemide upon discharge.  7.  Secondary hyperparathyroidism-patient will resume her Renvela.  8.  COPD-no acute exacerbation-patient will continue her Spiriva and albuterol nebulizers as needed.  9.  Hyperlipidemia-patient will continue her atorvastatin.  10.  History of previous CVA- patient will continue her aspirin, Plavix, atorvastatin.  Pt. Is being discharged back to her skilled nursing facility as mental status has improved.   DISCHARGE CONDITIONS:   Stable.   CONSULTS OBTAINED:  DRUG ALLERGIES:   Allergies  Allergen Reactions  . Cephalosporins Anaphylaxis    Patient has tolerated meropenem.   [name] Penicillins Anaphylaxis and Other (See Comments)    Has patient had a PCN reaction causing immediate rash, facial/tongue/throat swelling, SOB or lightheadedness with hypotension: Yes Has patient had a PCN  reaction causing severe rash involving mucus membranes or skin necrosis: No Has patient had a PCN reaction that required hospitalization No Has patient had a PCN reaction occurring within the last 10 years: No If all of the above answers are "NO", then may proceed with Cephalosporin use.  . Reglan [Metoclopramide] Other (See Comments)    Severe EPS (lip, head, body tremors) March 2018  . Risperdal [Risperidone] Other (See Comments)    Severe EPS (lip, head, body tremors) March 2018  . Lamictal [Lamotrigine] Other (See Comments)    Reaction:  Hallucinations  . Phenergan [Promethazine Hcl] Nausea And Vomiting    Sensitivity to medicine  . Sulfamethoxazole-Trimethoprim     Other reaction(s): hyperkalemia  . Sulfasalazine Other (See Comments)    Reaction:  Unknown   . Pravastatin Other (See Comments)    Reaction:  Muscle pain   . Sulfa Antibiotics Other (See Comments)    Reaction:  Unknown     DISCHARGE MEDICATIONS:   Allergies as of 02/26/2018      Reactions   Cephalosporins Anaphylaxis   Patient has tolerated meropenem.    Penicillins Anaphylaxis, Other (See Comments)   Has patient had a PCN reaction causing immediate rash, facial/tongue/throat swelling, SOB or lightheadedness with hypotension: Yes Has patient had a PCN reaction causing severe rash involving mucus membranes or skin necrosis: No Has patient had a PCN reaction that required hospitalization No Has patient had a PCN reaction occurring within the last 10 years: No If all of the above answers are "NO", then may proceed with Cephalosporin use.   Reglan [metoclopramide] Other (See Comments)   Severe EPS (lip, head, body tremors) March 2018   Risperdal [risperidone] Other (See Comments)   Severe EPS (lip, head, body tremors) March 2018   Lamictal [lamotrigine] Other (See Comments)   Reaction:  Hallucinations   Phenergan [promethazine Hcl] Nausea And Vomiting   Sensitivity to medicine   Sulfamethoxazole-trimethoprim     Other reaction(s): hyperkalemia   Sulfasalazine Other (See Comments)   Reaction:  Unknown    Pravastatin Other (See Comments)   Reaction:  Muscle pain    Sulfa Antibiotics Other (See Comments)   Reaction:  Unknown       Medication List    TAKE these medications   acetaminophen 500 MG tablet Commonly known as:  TYLENOL Take 500 mg by mouth every 6 (six) hours as needed for moderate pain or fever.   albuterol (2.5 MG/3ML) 0.083% nebulizer solution Commonly known as:  PROVENTIL Take 2.5 mg by nebulization every 4 (four) hours as needed for wheezing or shortness of breath.   amLODipine 10 MG tablet Commonly known as:  NORVASC Take 10 mg by mouth daily.   ARIPiprazole 5 MG tablet Commonly known as:  ABILIFY Take 2.5 mg by mouth 2 (two) times daily.   aspirin 81 MG chewable tablet Chew 81 mg by mouth daily.   atorvastatin 40 MG tablet Commonly known as:  LIPITOR Take 40 mg by mouth daily at 6 PM.   benztropine 0.5 MG tablet Commonly known as:  COGENTIN Take 0.5 mg by mouth 2 (two) times daily.   bisacodyl 10 MG suppository Commonly known as:  DULCOLAX Place 10 mg rectally as needed for moderate constipation.   budesonide 0.5 MG/2ML nebulizer solution Commonly known as:  PULMICORT Take 2 mLs (0.5 mg total) by nebulization 2 (two) times daily.   clonazePAM 0.5 MG tablet Commonly known as:  KLONOPIN Take  tablet (0.25MG ) by mouth every morning and 1 tablet (0.5MG ) by mouth every evening   clopidogrel 75 MG tablet Commonly known as:  PLAVIX Take 75 mg by mouth daily at 12 noon.   escitalopram 5 MG tablet Commonly known as:  LEXAPRO Take 5 mg by mouth daily.   fluticasone 50 MCG/ACT nasal spray Commonly known as:  FLONASE Place 1 spray into both nostrils daily.   gabapentin 300 MG capsule Commonly known as:  NEURONTIN Take 300 mg by mouth daily.   hydrALAZINE 50 MG tablet Commonly known as:  APRESOLINE Take 1 tablet (50MG ) by mouth three times daily (Sunday,  Tuesday, Thursday & Saturday) and take 1 tablet (50MG ) by mouth twice daily (Monday, Wednesday & Friday).   HYDROcodone-acetaminophen 5-325 MG tablet Commonly known as:  NORCO/VICODIN Take 1 tablet by mouth every 6 (six) hours as needed for moderate pain or severe pain.   insulin aspart 100 UNIT/ML injection Commonly known as:  novoLOG Inject 0-9 Units into the skin every 4 (four) hours.   isosorbide mononitrate 30 MG 24 hr tablet Commonly known as:  IMDUR Take 90 mg by mouth daily at 12 noon.   lactulose 10 GM/15ML solution Commonly known as:  CHRONULAC Take 10 g by mouth daily at 6 PM.   levETIRAcetam 750 MG tablet Commonly known as:  KEPPRA Take 2 tablets (1,500 mg total) by mouth daily at 12 noon.   levofloxacin 250 MG tablet Commonly known as:  LEVAQUIN Take 1 tablet (250 mg total) by mouth every other day for 4 doses. Start taking on:  02/27/2018   lidocaine 5 % Commonly known as:  LIDODERM Place 1 patch onto the skin at bedtime.   lidocaine 4 % cream Commonly known as:  LMX Apply 1 application topically daily as needed (pain). To right arm fistula   lisinopril 10 MG tablet Commonly known as:  PRINIVIL,ZESTRIL Take 10 mg by mouth daily at 12 noon.   LORazepam 0.5 MG tablet Commonly known as:  ATIVAN Take 0.5 mg by mouth every 6 (six) hours as needed for anxiety.   Melatonin 3 MG Tabs Take 3 mg by mouth at bedtime.   metoprolol tartrate 50 MG tablet Commonly known as:  LOPRESSOR Take 1 tablet (50 mg total) by mouth 2 (two) times daily.   multivitamin Tabs tablet Take 1 tablet by mouth daily.   nitroGLYCERIN 0.4 MG SL tablet Commonly known as:  NITROSTAT Place 0.4 mg under the tongue every 5 (five) minutes as needed for chest pain. Reported on 07/10/2015   omeprazole 20 MG capsule Commonly known as:  PRILOSEC Take 20 mg by mouth 2 (two) times daily.   ondansetron 4 MG disintegrating tablet Commonly known as:  ZOFRAN-ODT Take 1 tablet (4 mg total) by  mouth every 6 (six) hours as needed for nausea or vomiting.   OPTIVE 0.5-0.9 % ophthalmic solution Generic drug:  carboxymethylcellul-glycerin Apply to eye.   OXcarbazepine 150 MG tablet Commonly known as:  TRILEPTAL Take 150 mg by mouth at bedtime.   polyethylene glycol powder powder Commonly known as:  GLYCOLAX/MIRALAX 1 cap full in a full glass of water, two times a day for 3 days.   primidone 50 MG tablet Commonly known  as:  MYSOLINE Take 50 mg by mouth at bedtime.   REFRESH 1 % ophthalmic solution Generic drug:  carboxymethylcellulose Place 1 drop into both eyes 3 (three) times daily.   senna 8.6 MG Tabs tablet Commonly known as:  SENOKOT Take 2 tablets by mouth 2 (two) times daily.   sevelamer carbonate 800 MG tablet Commonly known as:  RENVELA Take 800 mg by mouth 4 (four) times daily -  before meals and at bedtime.   SPIRIVA RESPIMAT 2.5 MCG/ACT Aers Generic drug:  Tiotropium Bromide Monohydrate Inhale into the lungs.   torsemide 100 MG tablet Commonly known as:  DEMADEX Take 100 mg by mouth daily.   vitamin C 250 MG tablet Commonly known as:  ASCORBIC ACID Take 500 mg by mouth 2 (two) times daily.         DISCHARGE INSTRUCTIONS:   DIET:  Cardiac diet, Diabetic diet and Renal diet  DISCHARGE CONDITION:  Stable  ACTIVITY:  Activity as tolerated  OXYGEN:  Home Oxygen: No.   Oxygen Delivery: room air  DISCHARGE LOCATION:  nursing home   If you experience worsening of your admission symptoms, develop shortness of breath, life threatening emergency, suicidal or homicidal thoughts you must seek medical attention immediately by calling 911 or calling your MD immediately  if symptoms less severe.  You Must read complete instructions/literature along with all the possible adverse reactions/side effects for all the Medicines you take and that have been prescribed to you. Take any new Medicines after you have completely understood and accpet all the  possible adverse reactions/side effects.   Please note  You were cared for by a hospitalist during your hospital stay. If you have any questions about your discharge medications or the care you received while you were in the hospital after you are discharged, you can call the unit and asked to speak with the hospitalist on call if the hospitalist that took care of you is not available. Once you are discharged, your primary care physician will handle any further medical issues. Please note that NO REFILLS for any discharge medications will be authorized once you are discharged, as it is imperative that you return to your primary care physician (or establish a relationship with a primary care physician if you do not have one) for your aftercare needs so that they can reassess your need for medications and monitor your lab values.     Today   No other acute events overnight.  Mental status is improved.  Will discharge back to skilled nursing facility on oral antibiotics today.  VITAL SIGNS:  Blood pressure (!) 161/94, pulse 61, temperature 98.4 F (36.9 C), resp. rate 19, height 5\' 5"  (1.651 m), weight 106 kg, last menstrual period 03/16/2015, SpO2 93 %.  I/O:    Intake/Output Summary (Last 24 hours) at 02/26/2018 1146 Last data filed at 02/25/2018 1630 Gross per 24 hour  Intake 50 ml  Output 1000 ml  Net -950 ml    PHYSICAL EXAMINATION:   GENERAL:  52 y.o.-year-old patient lying in bed lethargic but follows simple commands. EYES: Pupils equal, round, reactive to light and accommodation. No scleral icterus. Extraocular muscles intact.  HEENT: Head atraumatic, normocephalic. Oropharynx and nasopharynx clear.  NECK:  Supple, no jugular venous distention. No thyroid enlargement, no tenderness.  LUNGS: Normal breath sounds bilaterally, no wheezing, rales, rhonchi. No use of accessory muscles of respiration.  CARDIOVASCULAR: S1, S2 normal. No murmurs, rubs, or gallops.  ABDOMEN: Soft,  nontender, nondistended. Bowel sounds  present. No organomegaly or mass.  EXTREMITIES: No cyanosis, clubbing or edema b/l. Right BKA.  NEUROLOGIC: Cranial nerves II through XII are intact. No focal Motor or sensory deficits b/l. Globally weak.   PSYCHIATRIC: The patient is alert and oriented x 3.  SKIN: No obvious rash, lesion, or ulcer.   Right upper extremity AV fistula with good bruit and thrill.  DATA REVIEW:   CBC Recent Labs  Lab 02/26/18 0343  WBC 5.2  HGB 11.7*  HCT 39.5  PLT 144*    Chemistries  Recent Labs  Lab 02/25/18 0342  NA 139  K 4.1  CL 100  CO2 31  GLUCOSE 114*  BUN 25*  CREATININE 5.94*  CALCIUM 8.5*  AST 30  ALT 10  ALKPHOS 148*  BILITOT 0.6    Cardiac Enzymes No results for input(s): TROPONINI in the last 168 hours.  Microbiology Results  Results for orders placed or performed during the hospital encounter of 02/24/18  Urine culture     Status: Abnormal   Collection Time: 02/24/18 12:35 PM  Result Value Ref Range Status   Specimen Description   Final    URINE, RANDOM Performed at [facility], [address] [place], [place] [address]    Special Requests   Final    NONE Performed at [facility], [address] [place], [place] [address]    Culture >=100,000 COLONIES/mL KLEBSIELLA PNEUMONIAE (A)  Final   Report Status 02/26/2018 FINAL  Final   Organism ID, Bacteria KLEBSIELLA PNEUMONIAE (A)  Final      Susceptibility   Klebsiella pneumoniae - MIC*    AMPICILLIN >=32 RESISTANT Resistant     CEFAZOLIN <=4 SENSITIVE Sensitive     CEFTRIAXONE <=1 SENSITIVE Sensitive     CIPROFLOXACIN <=0.25 SENSITIVE Sensitive     GENTAMICIN <=1 SENSITIVE Sensitive     IMIPENEM <=0.25 SENSITIVE Sensitive     NITROFURANTOIN 64 INTERMEDIATE Intermediate     TRIMETH/SULFA <=20 SENSITIVE Sensitive     AMPICILLIN/SULBACTAM 4 SENSITIVE Sensitive     PIP/TAZO <=4 SENSITIVE Sensitive     Extended ESBL NEGATIVE Sensitive     *  >=100,000 COLONIES/mL KLEBSIELLA PNEUMONIAE  MRSA PCR Screening     Status: Abnormal   Collection Time: 02/24/18  3:51 PM  Result Value Ref Range Status   MRSA by PCR POSITIVE (A) NEGATIVE Final    Comment:        The GeneXpert MRSA Assay (FDA approved for NASAL specimens only), is one component of a comprehensive MRSA colonization surveillance program. It is not intended to diagnose MRSA infection nor to guide or monitor treatment for MRSA infections. RESULT CALLED TO, READ BACK BY AND VERIFIED WITH: [name] 02/24/18 @ 1719  MLK Performed at [facility], [address] [place], [place] [address]     RADIOLOGY:  Dg Chest Portable 1 View  Result Date: 02/24/2018 CLINICAL DATA:  Altered mental status. EXAM: PORTABLE CHEST 1 VIEW COMPARISON:  Chest x-rays dated 08/28/2017 and 08/27/2017 FINDINGS: Cardiomegaly with bilateral perihilar hazy infiltrates consistent with pulmonary edema. There is suggestion of a small right pleural effusion. Pulmonary vascularity is at the upper limits of normal. No acute bone abnormality. IMPRESSION: Findings suggestive of mild pulmonary edema with a small right effusion. Electronically Signed   By: Francene Boyers M.D.   On: 02/24/2018 12:31      Management plans discussed with the patient, family and they are in agreement.  CODE STATUS:  Code Status Orders  (From admission, onward)         Start     Ordered   02/24/18 1541  Full code  Continuous     02/24/18 1540        TOTAL TIME TAKING CARE OF THIS PATIENT: 45 minutes.    Houston Siren M.D on 02/26/2018 at 11:46 AM  Between 7am to 6pm - Pager - [phone]  After 6pm go to [email or web address] - Social research officer, government  [facility] [place] [facility]  Office  [phone]  CC: Primary care physician; Dorothey Baseman, MD

## 2018-03-13 ENCOUNTER — Encounter (HOSPITAL_COMMUNITY): Payer: Self-pay | Admitting: *Deleted

## 2018-03-16 ENCOUNTER — Encounter (HOSPITAL_COMMUNITY): Payer: Self-pay | Admitting: Physician Assistant

## 2018-03-16 NOTE — Pre-Procedure Instructions (Addendum)
Kimberly Montgomery  03/16/2018     [facility] - [place], [place] - [address] 7[address] [place] [employer] [place] [address] Phone: [phone] Fax: [phone]   Your procedure is scheduled on Tuesday, March 31, 2018  Report to [facility] Admitting at 7:30 A.M.  Call this number if you have problems the morning of surgery:  [phone]   Remember:  Do not eat or drink after midnight Monday, March 30, 2018  Take these medicines the morning of surgery with A SIP OF WATER:  amLODipine (NORVASC), ARIPiprazole (ABILIFY), clonazePAM (KLONOPIN), aspirin, metoprolol (LOPRESSOR), omeprazole (PRILOSEC) Optive Eye drops If needed: acetaminophen (TYLENOL) for pain, HYDROcodone for pain, ondansetron (ZOFRAN ODT) for nausea or vomiting,  nitroGLYCERIN (NITROSTAT) for chest pain,  albuterol (PROVENTIL)  Nebulizer for wheezing or shortness of breath  Stop taking vitamins, Melatonin and herbal medications; stop now.     How to Manage Your Diabetes Before and After Surgery  Why is it important to control my blood sugar before and after surgery? . Improving blood sugar levels before and after surgery helps healing and can limit problems. . A way of improving blood sugar control is eating a healthy diet by: o  Eating less sugar and carbohydrates o  Increasing activity/exercise o  Talking with your doctor about reaching your blood sugar goals . High blood sugars (greater than 180 mg/dL) can raise your risk of infections and slow your recovery, so you will need to focus on controlling your diabetes during the weeks before surgery. . Make sure that the doctor who takes care of your diabetes knows about your planned surgery including the date and location.  How do I manage my blood sugar before surgery? . Check your blood sugar at least 4 times a day, starting 2 days before surgery, to make sure that the level is not too high or low. o Check your blood sugar the  morning of your surgery when you wake up and every 2 hours until you get to the Short Stay unit. . If your blood sugar is less than 70 mg/dL, you will need to treat for low blood sugar: o Do not take insulin. o Treat a low blood sugar (less than 70 mg/dL) with  cup of clear juice (cranberry or apple), 4 glucose tablets, OR glucose gel. Recheck blood sugar in 15 minutes after treatment (to make sure it is greater than 70 mg/dL). If your blood sugar is not greater than 70 mg/dL on recheck, call [phone] o  for further instructions. . Report your blood sugar to the short stay nurse when you get to Short Stay.  . If you are admitted to the hospital after surgery: o Your blood sugar will be checked by the staff and you will probably be given insulin after surgery (instead of oral diabetes medicines) to make sure you have good blood sugar levels. o The goal for blood sugar control after surgery is 80-180 mg/dL.     WHAT DO I DO ABOUT MY DIABETES MEDICATION?   [name] Do not take your Bedtime dose of Novolog Insulin . If your CBG is greater than 220 mg/dL, you may take  of your sliding scale (correction) dose of insulin.  Reviewed and Endorsed by [facility] Patient Education Committee, August 2015  Do not wear jewelry, make-up or nail polish.  Do not wear lotions, powders, or perfumes, or deodorant.  Do not shave 48 hours prior to surgery.   Do not bring  valuables to the hospital.  [facility] is not responsible for any belongings or valuables.  Contacts, dentures or bridgework may not be worn into surgery.For patients admitted to the hospital, discharge time will be determined by your treatment team. Patients discharged the day of surgery will not be allowed to drive home.  Please read over the following fact sheets that you were given.

## 2018-03-16 NOTE — Progress Notes (Signed)
Pre-op instructions faxed to [facility], C/O Nurse, Joni Reining. Joni Reining confirmed receipt of fax and verbalized understanding of all pre-op instructions. Please complete assessment DOS. Please see PA, Anesthesiology, note.

## 2018-03-16 NOTE — Progress Notes (Signed)
Anesthesia Chart Review: SAME DAY WORKUP   Case:  [id number] Date/Time:  03/17/18 1000   Procedure:  MRI BREAST WITH OR WITHOUT CONTRAST (N/A )   Anesthesia type:  General   Pre-op diagnosis:  BREAST EDEMA   Location:  [place] [facility] [place] [facility]   Surgeon:  Radiologist, Medication, MD      DISCUSSION: 52 yo female former smoker. Pertinent hx includes ESRD on hemodialysis,HTN, Hyperlipidemia, DM type II, Anxiety/Bipolar disorder, GERD, hx of previous CVA, Seizures (Last 2018 per neuro notes, on Keppra), CAD (Stent to LAD 2017; DES to prox Cx 07/2017), s/p Right BKA uses wheelchair.  Pt follows with cardiology, Dr. Neoma Laming for hx of CAD and PCI, most recently DES to proximal circumflex 07/2017. Per his last OV note 11/20/2017 she had a nuclear perfusion study 11/18/17 showing EF 58% and old infarct in LCX territory, unchanged from 2017. She also had an echo 11/20/17 showing EF 47%, grade 3 dd, trace PR, mild TR, mild MR. He advised to continue medical therapy and f/u in 3 months.  Anticipate she can proceed as planned barring acute status change.  VS: LMP 03/16/2015 Comment: partial hysterectomy  PROVIDERS: Juluis Pitch, MD is PCP  Neoma Laming, MD is Cardiologist  Andrey Spearman, MD is Neurologist  LABS: CBC 02/26/18 with mild anemia Hgb 11.7 and mild thrombocytopenia platelets 144. Will need DOS labs per anesthesia.  IMAGES: PORTABLE CHEST 1 VIEW 02/24/18  COMPARISON:  Chest x-rays dated 08/28/2017 and 08/27/2017  FINDINGS: Cardiomegaly with bilateral perihilar hazy infiltrates consistent with pulmonary edema. There is suggestion of a small right pleural effusion.  Pulmonary vascularity is at the upper limits of normal.  No acute bone abnormality.  IMPRESSION: Findings suggestive of mild pulmonary edema with a small right effusion.   EKG: 02/24/2018: Sinus rhythm. Rate 60. Right axis deviation. Borderline T abnormalities, anterior leads.  CV: TTE  11/20/2017 (outside record, copy on pt chart): Assessment: Technically difficult study due to body habitus and patient in wheelchair.  Moderately dilated left atrium with left ventricle, right ventricle, and aorta appear normal in size.  Unable to visualize right atrium.  Mild LV systolic dysfunction.  Unable to assess wall motion.  Mild left ventricular hypertrophy with grade 3 diastolic dysfunction.  Trace pulmonary regurgitation.  Mild tricuspid regurgitation.  Normal pulmonary artery pressure.  Moderate mitral annular calcification with mild left regurgitation.  Unable to interrogate aortic valve due to poor visualization.  Nuclear Stress 11/18/2017 (outside record, copy on pt chart): EKG results: Sinus bradycardia.  52 bpm.  No significant ST changes with Persantine.  Perfusion/wall motion findings: EF 58%.  Moderate size and intensity fixed lateral and apical wall defect, normal wall motion.  Impression: Ischemia in the left circumflex territory with normal LVEF.  Cath 08/18/2017:  Ost LAD to Prox LAD lesion is 40% stenosed.  Mid LAD to Dist LAD lesion is 40% stenosed.  Ost Cx to Prox Cx lesion is 90% stenosed.  Prox RCA to Mid RCA lesion is 40% stenosed.  Prox RCA lesion is 80% stenosed.  A drug-eluting stent was successfully placed using a STENT SIERRA 2.50 X 15 MM.  Post intervention, there is a 0% residual stenosis.   Conclusion Successful PCI and stent of proximal circumflex with DES 2.5 x 15 mm Xience [place] with a 2.5 x 12 mm [place] 18 atm Lesion reduced from 99 down to 0%   Past Medical History:  Diagnosis Date  . Anginal pain ([place])   .  Anxiety   . Bipolar disorder ([place])   . CAD (coronary artery disease)   . CHF (congestive heart failure) ([place])   . Chronic lower back pain   . Depression   . Endometriosis   . ESRD (end stage renal disease) on dialysis ([place])    "[facility]; [place]; [place]; TTS" (01/19/2016)  . Gastroparesis   . GERD  (gastroesophageal reflux disease)   . Heart attack ([place]) 08/2017   stent placed, [facility]  . History of blood transfusion "several"   "my blood would get low; low RBC"  . History of hiatal hernia   . HLD (hyperlipidemia)   . Hypertension   . Migraine    "monthly" (01/19/2016)  . Myocardial infarction ([place]) 2017   "~ 3 wks ago" (01/19/2016)  . Renal disorder   . Renal insufficiency   . Seizures ([place]) 07/2015   "I've only had the 1; don't know what from" (01/19/2016)  . Stroke ([place])   . Type II diabetes mellitus ([place])     Past Surgical History:  Procedure Laterality Date  . A/V FISTULAGRAM Left 11/27/2017   Procedure: A/V FISTULAGRAM;  Surgeon: Algernon Huxley, MD;  Location: [place] [facility];  Service: Cardiovascular;  Laterality: Left;  . ABDOMINAL HYSTERECTOMY     "partial"  . AV FISTULA PLACEMENT Right 08/23/2016   Procedure: ARTERIOVENOUS (AV) FISTULA CREATION  ( BRACHIAL CEPHALIC );  Surgeon: Katha Cabal, MD;  Location: [facility] ORS;  Service: Vascular;  Laterality: Right;  . BELOW KNEE LEG AMPUTATION Right 2010?  [name] CARDIAC CATHETERIZATION Right 07/10/2015   Procedure: Left Heart Cath and Coronary Angiography;  Surgeon: Dionisio David, MD;  Location: [place] [facility];  Service: Cardiovascular;  Laterality: Right;  . CARDIAC CATHETERIZATION Right 09/14/2015   Procedure: Left Heart Cath and Coronary Angiography;  Surgeon: Dionisio David, MD;  Location: [place] [facility];  Service: Cardiovascular;  Laterality: Right;  . CARDIAC CATHETERIZATION N/A 09/14/2015   Procedure: Coronary Stent Intervention;  Surgeon: Yolonda Kida, MD;  Location: [place] [facility];  Service: Cardiovascular;  Laterality: N/A;  . CARDIAC CATHETERIZATION Right 11/20/2015   Procedure: Left Heart Cath and Coronary Angiography;  Surgeon: Dionisio David, MD;  Location: [place] [facility];  Service: Cardiovascular;  Laterality: Right;  . CATARACT EXTRACTION, BILATERAL    . CORONARY  ANGIOPLASTY WITH STENT PLACEMENT  <2017   @ [facility]/notes 07/03/2013  . CORONARY ANGIOPLASTY WITH STENT PLACEMENT    . CORONARY STENT INTERVENTION N/A 08/28/2017   Procedure: CORONARY STENT INTERVENTION;  Surgeon: Yolonda Kida, MD;  Location: [place] [facility];  Service: Cardiovascular;  Laterality: N/A;  . DIALYSIS/PERMA CATHETER REMOVAL N/A 11/12/2016   Procedure: DIALYSIS/PERMA CATHETER REMOVAL;  Surgeon: Katha Cabal, MD;  Location: [place] [facility];  Service: Cardiovascular;  Laterality: N/A;  . HYSTEROTOMY    . PERIPHERAL VASCULAR CATHETERIZATION N/A 08/30/2015   Procedure: Dialysis/Perma Catheter Insertion;  Surgeon: Algernon Huxley, MD;  Location: [place] [facility];  Service: Cardiovascular;  Laterality: N/A;  . PERIPHERAL VASCULAR CATHETERIZATION N/A 01/01/2016   Procedure: Dialysis/Perma Catheter Insertion;  Surgeon: Algernon Huxley, MD;  Location: [place] [facility];  Service: Cardiovascular;  Laterality: N/A;  . PERITONEAL CATHETER INSERTION  11/03/2013   Archie Endo 11/03/2013  . PERITONEAL CATHETER REMOVAL  01/01/2016   "took the one from [place] out; put new PD cath in" (01/19/2016)  . PERITONEAL CATHETER REMOVAL  11/03/2013; 02/11/2014   Archie Endo 11/03/2013; Removal of  tunneled catheter/notes 02/11/2014  . RIGHT/LEFT HEART CATH AND CORONARY ANGIOGRAPHY N/A 08/28/2017   Procedure: RIGHT/LEFT HEART CATH AND CORONARY ANGIOGRAPHY;  Surgeon: Dionisio David, MD;  Location: [place] [facility];  Service: Cardiovascular;  Laterality: N/A;  . SALPINGOOPHORECTOMY Right    Archie Endo 07/03/2013  . TEE WITHOUT CARDIOVERSION N/A 06/07/2016   Procedure: TRANSESOPHAGEAL ECHOCARDIOGRAM (TEE);  Surgeon: Pixie Casino, MD;  Location: [facility] ENDOSCOPY;  Service: Cardiovascular;  Laterality: N/A;  . TUBAL LIGATION      MEDICATIONS: No current facility-administered medications for this encounter.    [name] acetaminophen (TYLENOL) 500 MG tablet  . albuterol (PROVENTIL) (2.5 MG/3ML) 0.083% nebulizer solution  .  amLODipine (NORVASC) 10 MG tablet  . ARIPiprazole (ABILIFY) 5 MG tablet  . aspirin 81 MG chewable tablet  . atorvastatin (LIPITOR) 40 MG tablet  . benztropine (COGENTIN) 0.5 MG tablet  . bisacodyl (DULCOLAX) 10 MG suppository  . budesonide (PULMICORT) 0.5 MG/2ML nebulizer solution  . carboxymethylcellul-glycerin (OPTIVE) 0.5-0.9 % ophthalmic solution  . clonazePAM (KLONOPIN) 0.5 MG tablet  . clopidogrel (PLAVIX) 75 MG tablet  . escitalopram (LEXAPRO) 5 MG tablet  . fluticasone (FLONASE) 50 MCG/ACT nasal spray  . gabapentin (NEURONTIN) 300 MG capsule  . hydrALAZINE (APRESOLINE) 50 MG tablet  . HYDROcodone-acetaminophen (NORCO/VICODIN) 5-325 MG tablet  . insulin aspart (NOVOLOG) 100 UNIT/ML injection  . isosorbide mononitrate (IMDUR) 30 MG 24 hr tablet  . lactulose (CHRONULAC) 10 GM/15ML solution  . levETIRAcetam (KEPPRA) 750 MG tablet  . lidocaine (LIDODERM) 5 %  . lidocaine (LMX) 4 % cream  . lisinopril (PRINIVIL,ZESTRIL) 10 MG tablet  . LORazepam (ATIVAN) 0.5 MG tablet  . Melatonin 3 MG TABS  . metoprolol (LOPRESSOR) 50 MG tablet  . Multiple Vitamin (MULTIVITAMIN WITH MINERALS) TABS tablet  . nitroGLYCERIN (NITROSTAT) 0.4 MG SL tablet  . omeprazole (PRILOSEC) 20 MG capsule  . ondansetron (ZOFRAN ODT) 4 MG disintegrating tablet  . OXcarbazepine (TRILEPTAL) 150 MG tablet  . senna (SENOKOT) 8.6 MG TABS tablet  . sevelamer carbonate (RENVELA) 800 MG tablet  . Tiotropium Bromide Monohydrate (SPIRIVA RESPIMAT) 2.5 MCG/ACT AERS  . torsemide (DEMADEX) 100 MG tablet  . vitamin C (ASCORBIC ACID) 250 MG tablet     Wynonia Musty [facility] Short Stay Center/Anesthesiology Phone [phone] 03/16/2018 3:29 PM

## 2018-03-17 ENCOUNTER — Ambulatory Visit (HOSPITAL_COMMUNITY): Payer: Medicare HMO

## 2018-03-30 ENCOUNTER — Other Ambulatory Visit: Payer: Self-pay

## 2018-03-30 ENCOUNTER — Inpatient Hospital Stay: Payer: Medicare HMO

## 2018-03-30 ENCOUNTER — Inpatient Hospital Stay
Admission: EM | Admit: 2018-03-30 | Discharge: 2018-04-07 | DRG: 189 | Disposition: A | Discharge status: Hospice | Payer: Medicare HMO | Source: Skilled Nursing Facility | Attending: Internal Medicine | Admitting: Internal Medicine

## 2018-03-30 ENCOUNTER — Emergency Department: Payer: Medicare HMO

## 2018-03-30 ENCOUNTER — Encounter: Payer: Self-pay | Admitting: Emergency Medicine

## 2018-03-30 DIAGNOSIS — E1143 Type 2 diabetes mellitus with diabetic autonomic (poly)neuropathy: Secondary | ICD-10-CM | POA: Diagnosis present

## 2018-03-30 DIAGNOSIS — J810 Acute pulmonary edema: Secondary | ICD-10-CM

## 2018-03-30 DIAGNOSIS — K219 Gastro-esophageal reflux disease without esophagitis: Secondary | ICD-10-CM | POA: Diagnosis present

## 2018-03-30 DIAGNOSIS — Z515 Encounter for palliative care: Secondary | ICD-10-CM | POA: Diagnosis not present

## 2018-03-30 DIAGNOSIS — J9601 Acute respiratory failure with hypoxia: Secondary | ICD-10-CM

## 2018-03-30 DIAGNOSIS — J9602 Acute respiratory failure with hypercapnia: Secondary | ICD-10-CM | POA: Diagnosis present

## 2018-03-30 DIAGNOSIS — K819 Cholecystitis, unspecified: Secondary | ICD-10-CM

## 2018-03-30 DIAGNOSIS — Z9981 Dependence on supplemental oxygen: Secondary | ICD-10-CM | POA: Diagnosis not present

## 2018-03-30 DIAGNOSIS — Z992 Dependence on renal dialysis: Secondary | ICD-10-CM

## 2018-03-30 DIAGNOSIS — L98429 Non-pressure chronic ulcer of back with unspecified severity: Secondary | ICD-10-CM | POA: Diagnosis present

## 2018-03-30 DIAGNOSIS — Z8673 Personal history of transient ischemic attack (TIA), and cerebral infarction without residual deficits: Secondary | ICD-10-CM

## 2018-03-30 DIAGNOSIS — J9811 Atelectasis: Secondary | ICD-10-CM | POA: Diagnosis not present

## 2018-03-30 DIAGNOSIS — Z66 Do not resuscitate: Secondary | ICD-10-CM | POA: Diagnosis not present

## 2018-03-30 DIAGNOSIS — Z8049 Family history of malignant neoplasm of other genital organs: Secondary | ICD-10-CM

## 2018-03-30 DIAGNOSIS — R4182 Altered mental status, unspecified: Secondary | ICD-10-CM

## 2018-03-30 DIAGNOSIS — I252 Old myocardial infarction: Secondary | ICD-10-CM

## 2018-03-30 DIAGNOSIS — N186 End stage renal disease: Secondary | ICD-10-CM | POA: Diagnosis present

## 2018-03-30 DIAGNOSIS — E876 Hypokalemia: Secondary | ICD-10-CM | POA: Diagnosis present

## 2018-03-30 DIAGNOSIS — G40909 Epilepsy, unspecified, not intractable, without status epilepticus: Secondary | ICD-10-CM | POA: Diagnosis present

## 2018-03-30 DIAGNOSIS — Z79899 Other long term (current) drug therapy: Secondary | ICD-10-CM

## 2018-03-30 DIAGNOSIS — Z87891 Personal history of nicotine dependence: Secondary | ICD-10-CM

## 2018-03-30 DIAGNOSIS — D631 Anemia in chronic kidney disease: Secondary | ICD-10-CM | POA: Diagnosis present

## 2018-03-30 DIAGNOSIS — Z955 Presence of coronary angioplasty implant and graft: Secondary | ICD-10-CM

## 2018-03-30 DIAGNOSIS — E1122 Type 2 diabetes mellitus with diabetic chronic kidney disease: Secondary | ICD-10-CM | POA: Diagnosis present

## 2018-03-30 DIAGNOSIS — G9341 Metabolic encephalopathy: Secondary | ICD-10-CM | POA: Diagnosis present

## 2018-03-30 DIAGNOSIS — I2510 Atherosclerotic heart disease of native coronary artery without angina pectoris: Secondary | ICD-10-CM | POA: Diagnosis present

## 2018-03-30 DIAGNOSIS — Z7951 Long term (current) use of inhaled steroids: Secondary | ICD-10-CM

## 2018-03-30 DIAGNOSIS — Z7189 Other specified counseling: Secondary | ICD-10-CM | POA: Diagnosis not present

## 2018-03-30 DIAGNOSIS — F319 Bipolar disorder, unspecified: Secondary | ICD-10-CM | POA: Diagnosis present

## 2018-03-30 DIAGNOSIS — H9190 Unspecified hearing loss, unspecified ear: Secondary | ICD-10-CM | POA: Diagnosis not present

## 2018-03-30 DIAGNOSIS — I132 Hypertensive heart and chronic kidney disease with heart failure and with stage 5 chronic kidney disease, or end stage renal disease: Secondary | ICD-10-CM | POA: Diagnosis present

## 2018-03-30 DIAGNOSIS — Z833 Family history of diabetes mellitus: Secondary | ICD-10-CM

## 2018-03-30 DIAGNOSIS — N2581 Secondary hyperparathyroidism of renal origin: Secondary | ICD-10-CM | POA: Diagnosis present

## 2018-03-30 DIAGNOSIS — Z881 Allergy status to other antibiotic agents status: Secondary | ICD-10-CM

## 2018-03-30 DIAGNOSIS — Z9841 Cataract extraction status, right eye: Secondary | ICD-10-CM

## 2018-03-30 DIAGNOSIS — G250 Essential tremor: Secondary | ICD-10-CM | POA: Diagnosis present

## 2018-03-30 DIAGNOSIS — Z22322 Carrier or suspected carrier of Methicillin resistant Staphylococcus aureus: Secondary | ICD-10-CM

## 2018-03-30 DIAGNOSIS — Z880 Allergy status to penicillin: Secondary | ICD-10-CM

## 2018-03-30 DIAGNOSIS — Z9842 Cataract extraction status, left eye: Secondary | ICD-10-CM

## 2018-03-30 DIAGNOSIS — J449 Chronic obstructive pulmonary disease, unspecified: Secondary | ICD-10-CM | POA: Diagnosis present

## 2018-03-30 DIAGNOSIS — Z79890 Hormone replacement therapy (postmenopausal): Secondary | ICD-10-CM

## 2018-03-30 DIAGNOSIS — J9600 Acute respiratory failure, unspecified whether with hypoxia or hypercapnia: Secondary | ICD-10-CM

## 2018-03-30 DIAGNOSIS — Z87440 Personal history of urinary (tract) infections: Secondary | ICD-10-CM

## 2018-03-30 DIAGNOSIS — B952 Enterococcus as the cause of diseases classified elsewhere: Secondary | ICD-10-CM | POA: Diagnosis present

## 2018-03-30 DIAGNOSIS — L89151 Pressure ulcer of sacral region, stage 1: Secondary | ICD-10-CM | POA: Diagnosis not present

## 2018-03-30 DIAGNOSIS — F0390 Unspecified dementia without behavioral disturbance: Secondary | ICD-10-CM | POA: Diagnosis present

## 2018-03-30 DIAGNOSIS — K3184 Gastroparesis: Secondary | ICD-10-CM | POA: Diagnosis present

## 2018-03-30 DIAGNOSIS — Z6839 Body mass index (BMI) 39.0-39.9, adult: Secondary | ICD-10-CM

## 2018-03-30 DIAGNOSIS — R109 Unspecified abdominal pain: Secondary | ICD-10-CM

## 2018-03-30 DIAGNOSIS — E785 Hyperlipidemia, unspecified: Secondary | ICD-10-CM | POA: Diagnosis present

## 2018-03-30 DIAGNOSIS — I5032 Chronic diastolic (congestive) heart failure: Secondary | ICD-10-CM | POA: Diagnosis present

## 2018-03-30 DIAGNOSIS — G9340 Encephalopathy, unspecified: Secondary | ICD-10-CM

## 2018-03-30 DIAGNOSIS — R188 Other ascites: Secondary | ICD-10-CM | POA: Diagnosis present

## 2018-03-30 DIAGNOSIS — Z818 Family history of other mental and behavioral disorders: Secondary | ICD-10-CM

## 2018-03-30 DIAGNOSIS — R1310 Dysphagia, unspecified: Secondary | ICD-10-CM | POA: Diagnosis present

## 2018-03-30 DIAGNOSIS — E669 Obesity, unspecified: Secondary | ICD-10-CM | POA: Diagnosis present

## 2018-03-30 DIAGNOSIS — I050 Rheumatic mitral stenosis: Secondary | ICD-10-CM | POA: Diagnosis present

## 2018-03-30 DIAGNOSIS — N390 Urinary tract infection, site not specified: Secondary | ICD-10-CM | POA: Diagnosis present

## 2018-03-30 DIAGNOSIS — Z89511 Acquired absence of right leg below knee: Secondary | ICD-10-CM

## 2018-03-30 DIAGNOSIS — Z90721 Acquired absence of ovaries, unilateral: Secondary | ICD-10-CM

## 2018-03-30 DIAGNOSIS — Z90711 Acquired absence of uterus with remaining cervical stump: Secondary | ICD-10-CM

## 2018-03-30 DIAGNOSIS — Z794 Long term (current) use of insulin: Secondary | ICD-10-CM

## 2018-03-30 DIAGNOSIS — Z79891 Long term (current) use of opiate analgesic: Secondary | ICD-10-CM

## 2018-03-30 DIAGNOSIS — Z7982 Long term (current) use of aspirin: Secondary | ICD-10-CM

## 2018-03-30 DIAGNOSIS — Z8249 Family history of ischemic heart disease and other diseases of the circulatory system: Secondary | ICD-10-CM

## 2018-03-30 DIAGNOSIS — K8020 Calculus of gallbladder without cholecystitis without obstruction: Secondary | ICD-10-CM | POA: Diagnosis present

## 2018-03-30 DIAGNOSIS — R001 Bradycardia, unspecified: Secondary | ICD-10-CM | POA: Diagnosis present

## 2018-03-30 DIAGNOSIS — Z7902 Long term (current) use of antithrombotics/antiplatelets: Secondary | ICD-10-CM

## 2018-03-30 DIAGNOSIS — Z888 Allergy status to other drugs, medicaments and biological substances status: Secondary | ICD-10-CM

## 2018-03-30 DIAGNOSIS — Z882 Allergy status to sulfonamides status: Secondary | ICD-10-CM

## 2018-03-30 LAB — COMPREHENSIVE METABOLIC PANEL
ALT: 6 U/L (ref 0–44)
AST: 21 U/L (ref 15–41)
Albumin: 3.3 g/dL — ABNORMAL LOW (ref 3.5–5.0)
Alkaline Phosphatase: 142 U/L — ABNORMAL HIGH (ref 38–126)
Anion gap: 12 (ref 5–15)
BUN: 29 mg/dL — ABNORMAL HIGH (ref 6–20)
CO2: 24 mmol/L (ref 22–32)
Calcium: 8.1 mg/dL — ABNORMAL LOW (ref 8.9–10.3)
Chloride: 101 mmol/L (ref 98–111)
Creatinine, Ser: 5.57 mg/dL — ABNORMAL HIGH (ref 0.44–1.00)
GFR calc Af Amer: 9 mL/min — ABNORMAL LOW (ref >60)
GFR calc non Af Amer: 8 mL/min — ABNORMAL LOW (ref >60)
Glucose, Bld: 110 mg/dL — ABNORMAL HIGH (ref 70–99)
Potassium: 4.8 mmol/L (ref 3.5–5.1)
Sodium: 137 mmol/L (ref 135–145)
Total Bilirubin: 0.8 mg/dL (ref 0.3–1.2)
Total Protein: 6.6 g/dL (ref 6.5–8.1)

## 2018-03-30 LAB — URINE DRUG SCREEN, QUALITATIVE (ARMC ONLY)
Amphetamines, Ur Screen: NOT DETECTED
BARBITURATES, UR SCREEN: NOT DETECTED
Benzodiazepine, Ur Scrn: NOT DETECTED
COCAINE METABOLITE, UR ~~LOC~~: NOT DETECTED
Cannabinoid 50 Ng, Ur ~~LOC~~: NOT DETECTED
MDMA (Ecstasy)Ur Screen: NOT DETECTED
Methadone Scn, Ur: NOT DETECTED
Opiate, Ur Screen: NOT DETECTED
Phencyclidine (PCP) Ur S: NOT DETECTED
Tricyclic, Ur Screen: NOT DETECTED

## 2018-03-30 LAB — URINALYSIS, COMPLETE (UACMP) WITH MICROSCOPIC
Bilirubin Urine: NEGATIVE
Glucose, UA: NEGATIVE mg/dL
Ketones, ur: NEGATIVE mg/dL
Nitrite: NEGATIVE
Protein, ur: >=300 mg/dL — AB
RBC / HPF: >50 RBC/hpf — ABNORMAL HIGH (ref 0–5)
Specific Gravity, Urine: 1.019 (ref 1.005–1.030)
WBC, UA: >50 WBC/hpf — ABNORMAL HIGH (ref 0–5)
pH: 6 (ref 5.0–8.0)

## 2018-03-30 LAB — CBC
HCT: 42.4 % (ref 36.0–46.0)
Hemoglobin: 12.3 g/dL (ref 12.0–15.0)
MCH: 28.4 pg (ref 26.0–34.0)
MCHC: 29 g/dL — ABNORMAL LOW (ref 30.0–36.0)
MCV: 97.9 fL (ref 80.0–100.0)
Platelets: 183 10*3/uL (ref 150–400)
RBC: 4.33 MIL/uL (ref 3.87–5.11)
RDW: 16.9 % — ABNORMAL HIGH (ref 11.5–15.5)
WBC: 7.3 10*3/uL (ref 4.0–10.5)
nRBC: 0 % (ref 0.0–0.2)

## 2018-03-30 LAB — BLOOD GAS, VENOUS
Acid-Base Excess: 1.8 mmol/L (ref 0.0–2.0)
Bicarbonate: 31.1 mmol/L — ABNORMAL HIGH (ref 20.0–28.0)
FIO2: 0.21
O2 Saturation: 87.1 %
PCO2 VEN: 71 mmHg — AB (ref 44.0–60.0)
PO2 VEN: 62 mmHg — AB (ref 32.0–45.0)
Patient temperature: 37
pH, Ven: 7.25 (ref 7.250–7.430)

## 2018-03-30 LAB — GLUCOSE, CAPILLARY
GLUCOSE-CAPILLARY: 76 mg/dL (ref 70–99)
Glucose-Capillary: 80 mg/dL (ref 70–99)
Glucose-Capillary: 92 mg/dL (ref 70–99)

## 2018-03-30 LAB — AMMONIA: Ammonia: 28 umol/L (ref 9–35)

## 2018-03-30 LAB — MRSA PCR SCREENING: MRSA by PCR: POSITIVE — AB

## 2018-03-30 MED ORDER — ALBUTEROL SULFATE (2.5 MG/3ML) 0.083% IN NEBU: 2.5000 mg | INHALATION_SOLUTION | RESPIRATORY_TRACT | Status: DC | PRN

## 2018-03-30 MED ORDER — BUDESONIDE 0.5 MG/2ML IN SUSP
0.5000 mg | Freq: Two times a day (BID) | RESPIRATORY_TRACT | Status: DC
  Administered 2018-03-30 – 2018-03-31 (×3): 0.5 mg via RESPIRATORY_TRACT
  Filled 2018-03-30 (×5): qty 2

## 2018-03-30 MED ORDER — LEVETIRACETAM 750 MG PO TABS
1500.0000 mg | ORAL_TABLET | Freq: Every day | ORAL | Status: DC
  Filled 2018-03-30: qty 2

## 2018-03-30 MED ORDER — ONDANSETRON 4 MG PO TBDP: 4.0000 mg | ORAL_TABLET | Freq: Four times a day (QID) | ORAL | Status: DC | PRN

## 2018-03-30 MED ORDER — ACETAMINOPHEN 650 MG RE SUPP: 650.0000 mg | Freq: Four times a day (QID) | RECTAL | Status: DC | PRN

## 2018-03-30 MED ORDER — LACTULOSE 10 GM/15ML PO SOLN
10.0000 g | Freq: Every day | ORAL | Status: DC
  Administered 2018-03-30 – 2018-04-06 (×7): 10 g via ORAL
  Filled 2018-03-30 (×7): qty 30

## 2018-03-30 MED ORDER — TIOTROPIUM BROMIDE MONOHYDRATE 18 MCG IN CAPS
1.0000 | ORAL_CAPSULE | Freq: Every day | RESPIRATORY_TRACT | Status: DC
  Administered 2018-03-30 – 2018-04-06 (×5): 18 ug via RESPIRATORY_TRACT
  Filled 2018-03-30: qty 5

## 2018-03-30 MED ORDER — LEVETIRACETAM IN NACL 1500 MG/100ML IV SOLN
1500.0000 mg | Freq: Once | INTRAVENOUS | Status: AC
  Administered 2018-03-30: 1500 mg via INTRAVENOUS
  Filled 2018-03-30: qty 100

## 2018-03-30 MED ORDER — LEVETIRACETAM 500 MG PO TABS
1500.0000 mg | ORAL_TABLET | Freq: Every day | ORAL | Status: DC
  Administered 2018-04-01: 1500 mg via ORAL
  Filled 2018-03-30: qty 2
  Filled 2018-03-30: qty 3

## 2018-03-30 MED ORDER — CLOPIDOGREL BISULFATE 75 MG PO TABS
75.0000 mg | ORAL_TABLET | Freq: Every day | ORAL | Status: DC
  Administered 2018-04-01 – 2018-04-06 (×6): 75 mg via ORAL
  Filled 2018-03-30 (×6): qty 1

## 2018-03-30 MED ORDER — NOREPINEPHRINE BITARTRATE 1 MG/ML IV SOLN
0.0000 ug/min | INTRAVENOUS | Status: DC
  Filled 2018-03-30: qty 4

## 2018-03-30 MED ORDER — ARIPIPRAZOLE 5 MG PO TABS
2.5000 mg | ORAL_TABLET | Freq: Two times a day (BID) | ORAL | Status: DC
  Administered 2018-03-30 – 2018-04-05 (×10): 2.5 mg via ORAL
  Filled 2018-03-30 (×14): qty 1

## 2018-03-30 MED ORDER — ISOSORBIDE MONONITRATE ER 30 MG PO TB24
90.0000 mg | ORAL_TABLET | Freq: Every day | ORAL | Status: DC
  Administered 2018-04-01 – 2018-04-02 (×2): 90 mg via ORAL
  Filled 2018-03-30 (×2): qty 3

## 2018-03-30 MED ORDER — LIDOCAINE 5 % EX PTCH
1.0000 | MEDICATED_PATCH | Freq: Every day | CUTANEOUS | Status: DC
  Administered 2018-03-30 – 2018-04-06 (×8): 1 via TRANSDERMAL
  Filled 2018-03-30 (×10): qty 1

## 2018-03-30 MED ORDER — HEPARIN SODIUM (PORCINE) 5000 UNIT/ML IJ SOLN
5000.0000 [IU] | Freq: Three times a day (TID) | INTRAMUSCULAR | Status: DC
  Administered 2018-03-30 – 2018-04-02 (×8): 5000 [IU] via SUBCUTANEOUS
  Filled 2018-03-30 (×8): qty 1

## 2018-03-30 MED ORDER — INSULIN ASPART 100 UNIT/ML ~~LOC~~ SOLN: 0.0000 [IU] | Freq: Every day | SUBCUTANEOUS | Status: DC

## 2018-03-30 MED ORDER — CIPROFLOXACIN IN D5W 400 MG/200ML IV SOLN
400.0000 mg | Freq: Once | INTRAVENOUS | Status: AC
  Administered 2018-03-30: 400 mg via INTRAVENOUS
  Filled 2018-03-30: qty 200

## 2018-03-30 MED ORDER — ONDANSETRON HCL 4 MG/2ML IJ SOLN
4.0000 mg | Freq: Four times a day (QID) | INTRAMUSCULAR | Status: DC | PRN
  Administered 2018-04-01 – 2018-04-04 (×3): 4 mg via INTRAVENOUS
  Filled 2018-03-30 (×3): qty 2

## 2018-03-30 MED ORDER — POLYVINYL ALCOHOL 1.4 % OP SOLN
1.0000 [drp] | Freq: Three times a day (TID) | OPHTHALMIC | Status: DC
  Administered 2018-03-30 – 2018-04-07 (×19): 1 [drp] via OPHTHALMIC
  Filled 2018-03-30 (×2): qty 15

## 2018-03-30 MED ORDER — BENZTROPINE MESYLATE 0.5 MG PO TABS
0.5000 mg | ORAL_TABLET | Freq: Two times a day (BID) | ORAL | Status: DC
  Administered 2018-03-30 – 2018-04-04 (×8): 0.5 mg via ORAL
  Filled 2018-03-30 (×14): qty 1

## 2018-03-30 MED ORDER — LIDOCAINE 4 % EX CREA
1.0000 "application " | TOPICAL_CREAM | CUTANEOUS | Status: DC
  Administered 2018-03-30: 1 via TOPICAL
  Filled 2018-03-30: qty 5

## 2018-03-30 MED ORDER — CARBOXYMETHYLCELLULOSE SODIUM 1 % OP GEL: 1.0000 [drp] | Freq: Every day | OPHTHALMIC | Status: DC

## 2018-03-30 MED ORDER — ACETAMINOPHEN 500 MG PO TABS: 500.0000 mg | ORAL_TABLET | Freq: Four times a day (QID) | ORAL | Status: DC | PRN

## 2018-03-30 MED ORDER — ASPIRIN 81 MG PO CHEW: 81.0000 mg | CHEWABLE_TABLET | Freq: Every day | ORAL | Status: DC

## 2018-03-30 MED ORDER — BISACODYL 10 MG RE SUPP
10.0000 mg | RECTAL | Status: DC | PRN
  Filled 2018-03-30: qty 1

## 2018-03-30 MED ORDER — FLUTICASONE PROPIONATE 50 MCG/ACT NA SUSP
1.0000 | Freq: Every day | NASAL | Status: DC
  Administered 2018-03-30 – 2018-04-05 (×5): 1 via NASAL
  Filled 2018-03-30: qty 16

## 2018-03-30 MED ORDER — CHLORHEXIDINE GLUCONATE 0.12 % MT SOLN
15.0000 mL | Freq: Two times a day (BID) | OROMUCOSAL | Status: DC
  Administered 2018-03-31 – 2018-04-07 (×10): 15 mL via OROMUCOSAL
  Filled 2018-03-30 (×11): qty 15

## 2018-03-30 MED ORDER — NALOXONE HCL 2 MG/2ML IJ SOSY
0.4000 mg | PREFILLED_SYRINGE | Freq: Once | INTRAMUSCULAR | Status: AC
  Administered 2018-03-30: 0.4 mg via INTRAVENOUS
  Filled 2018-03-30: qty 2

## 2018-03-30 MED ORDER — OXCARBAZEPINE 150 MG PO TABS
150.0000 mg | ORAL_TABLET | Freq: Every day | ORAL | Status: DC
  Administered 2018-03-30 – 2018-04-06 (×8): 150 mg via ORAL
  Filled 2018-03-30 (×10): qty 1

## 2018-03-30 MED ORDER — MORPHINE SULFATE (PF) 2 MG/ML IV SOLN
1.0000 mg | INTRAVENOUS | Status: DC | PRN
  Administered 2018-03-30 – 2018-04-04 (×12): 2 mg via INTRAVENOUS
  Administered 2018-04-05: 1 mg via INTRAVENOUS
  Administered 2018-04-05 – 2018-04-06 (×3): 2 mg via INTRAVENOUS
  Filled 2018-03-30 (×16): qty 1

## 2018-03-30 MED ORDER — CIPROFLOXACIN IN D5W 400 MG/200ML IV SOLN
400.0000 mg | INTRAVENOUS | Status: DC
  Filled 2018-03-30: qty 200

## 2018-03-30 MED ORDER — ONDANSETRON HCL 4 MG PO TABS: 4.0000 mg | ORAL_TABLET | Freq: Four times a day (QID) | ORAL | Status: DC | PRN

## 2018-03-30 MED ORDER — ESCITALOPRAM OXALATE 10 MG PO TABS
5.0000 mg | ORAL_TABLET | Freq: Every day | ORAL | Status: DC
  Administered 2018-03-30 – 2018-04-04 (×6): 5 mg via ORAL
  Filled 2018-03-30 (×8): qty 0.5

## 2018-03-30 MED ORDER — PANTOPRAZOLE SODIUM 40 MG PO TBEC
40.0000 mg | DELAYED_RELEASE_TABLET | Freq: Two times a day (BID) | ORAL | Status: DC
  Administered 2018-03-30 – 2018-04-07 (×12): 40 mg via ORAL
  Filled 2018-03-30 (×12): qty 1

## 2018-03-30 MED ORDER — ORAL CARE MOUTH RINSE
15.0000 mL | Freq: Two times a day (BID) | OROMUCOSAL | Status: DC
  Administered 2018-03-30 – 2018-04-06 (×5): 15 mL via OROMUCOSAL

## 2018-03-30 MED ORDER — NITROGLYCERIN 0.4 MG SL SUBL: 0.4000 mg | SUBLINGUAL_TABLET | SUBLINGUAL | Status: DC | PRN

## 2018-03-30 MED ORDER — ASPIRIN 300 MG RE SUPP
300.0000 mg | Freq: Every day | RECTAL | Status: DC
  Administered 2018-03-30 – 2018-04-06 (×8): 300 mg via RECTAL
  Filled 2018-03-30 (×11): qty 1

## 2018-03-30 MED ORDER — INSULIN ASPART 100 UNIT/ML ~~LOC~~ SOLN
0.0000 [IU] | Freq: Three times a day (TID) | SUBCUTANEOUS | Status: DC
  Administered 2018-04-02 – 2018-04-05 (×5): 1 [IU] via SUBCUTANEOUS
  Filled 2018-03-30 (×5): qty 1

## 2018-03-30 MED ORDER — MELATONIN 5 MG PO TABS
2.5000 mg | ORAL_TABLET | Freq: Every day | ORAL | Status: DC
  Administered 2018-03-30 – 2018-04-06 (×8): 2.5 mg via ORAL
  Filled 2018-03-30 (×11): qty 0.5

## 2018-03-30 NOTE — Progress Notes (Signed)
Spoke with pt nurse, Soumy, at [facility]. Nurse to make MD ordering MRI aware that pt is currently being treated at [facility] for altered mental status.

## 2018-03-30 NOTE — Progress Notes (Signed)
Pt transported to ICU on BiPAP with RN without incident. BiPAP plugged into red outlet and oxygen connected to oxygen connector. Report given to floor RT.

## 2018-03-30 NOTE — Progress Notes (Signed)
Pharmacy Antibiotic Note  Kimberly Montgomery is a 52 y.o. female admitted on 03/30/2018 with AMS. Patient has had similar presentation in the past with AMS secondary to UTI. Pharmacy has been consulted for ciprofloxacin dosing.  Plan: Ciprofloxacin 400 mg IV q24h. Will schedule daily at 1800 so that dose can be given AFTER dialysis on HD days.  Height: 5\' 8"  (172.7 cm) Weight: 233 lb 11 oz (106 kg) IBW/kg (Calculated) : 63.9  Temp (24hrs), Avg:97.2 F (36.2 C), Min:97.2 F (36.2 C), Max:97.2 F (36.2 C)  Recent Labs  Lab 03/30/18 0828  WBC 7.3  CREATININE 5.57*    Estimated Creatinine Clearance: 14.7 mL/min (A) (by C-G formula based on SCr of 5.57 mg/dL (H)).    Allergies  Allergen Reactions  . Cephalosporins Anaphylaxis    Patient has tolerated meropenem.   [name] Penicillins Anaphylaxis and Other (See Comments)    Has patient had a PCN reaction causing immediate rash, facial/tongue/throat swelling, SOB or lightheadedness with hypotension: Yes Has patient had a PCN reaction causing severe rash involving mucus membranes or skin necrosis: No Has patient had a PCN reaction that required hospitalization No Has patient had a PCN reaction occurring within the last 10 years: No If all of the above answers are "NO", then may proceed with Cephalosporin use.  . Reglan [Metoclopramide] Other (See Comments)    Severe EPS (lip, head, body tremors) March 2018  . Risperdal [Risperidone] Other (See Comments)    Severe EPS (lip, head, body tremors) March 2018  . Lamictal [Lamotrigine] Other (See Comments)    Reaction:  Hallucinations  . Phenergan [Promethazine Hcl] Nausea And Vomiting    Sensitivity to medicine  . Sulfamethoxazole-Trimethoprim     Other reaction(s): hyperkalemia  . Sulfasalazine Other (See Comments)    Reaction:  Unknown   . Pravastatin Other (See Comments)    Reaction:  Muscle pain   . Sulfa Antibiotics Other (See Comments)    Reaction:  Unknown     Antimicrobials this  admission: Ciprofloxacin 12/30 >>   Dose adjustments this admission: NA  Microbiology results: 12/30 UCx: pending  12/30 MRSA PCR: pending  Thank you for allowing pharmacy to be a part of this patient's care.  Pricilla Riffle, PharmD Pharmacy Resident  03/30/2018 11:50 AM

## 2018-03-30 NOTE — Progress Notes (Signed)
Attempted to call daughter to update her that the patient was admitted to ICU. Daughter did not pick up the phone at this time.

## 2018-03-30 NOTE — Progress Notes (Signed)
Pre HD assessment    03/30/18 1931  Neurological  Level of Consciousness Alert  Orientation Level Oriented to person;Oriented to situation;Disoriented to place;Disoriented to time  Respiratory  Respiratory Pattern Regular;Unlabored  Chest Assessment Chest expansion symmetrical  Cardiac  Pulse Regular  ECG Monitor Yes  Cardiac Rhythm SB  Vascular  R Radial Pulse +2  L Radial Pulse +2  Edema Generalized  Integumentary  Integumentary (WDL) X  Skin Color Appropriate for ethnicity  Musculoskeletal  Musculoskeletal (WDL) X  Generalized Weakness Yes  Assistive Device None  GU Assessment  Genitourinary (WDL) X  Genitourinary Symptoms  (HD)  Psychosocial  Psychosocial (WDL) X  Patient Behaviors Tearful;Restless  Needs Expressed Physical  Emotional support given Given to patient

## 2018-03-30 NOTE — Progress Notes (Signed)
HD tx start    03/30/18 1954  Vital Signs  Pulse Rate (!) 58  Pulse Rate Source Monitor  Resp 19  BP (!) 104/46  BP Location Left Arm  BP Method Automatic  Patient Position (if appropriate) Lying  Oxygen Therapy  SpO2 100 %  O2 Device Nasal Cannula  O2 Flow Rate (L/min) 3 L/min  During Hemodialysis Assessment  Blood Flow Rate (mL/min) 400 mL/min  Arterial Pressure (mmHg) -160 mmHg  Venous Pressure (mmHg) 180 mmHg  Transmembrane Pressure (mmHg) 60 mmHg  Ultrafiltration Rate (mL/min) 1000 mL/min  Dialysate Flow Rate (mL/min) 600 ml/min  Conductivity: Machine  13.7  HD Safety Checks Performed Yes  Dialysis Fluid Bolus Normal Saline  Bolus Amount (mL) 250 mL  Intra-Hemodialysis Comments Tx initiated

## 2018-03-30 NOTE — Progress Notes (Signed)
Post HD assessment. Pt tolerated tx well without c/o or complication. Net UF 3014, goal met.    03/30/18 2338  Vital Signs  Temp 98.1 F (36.7 C)  Temp Source Oral  Pulse Rate 64  Pulse Rate Source Monitor  Resp 16  BP (!) 138/52  BP Location Left Arm  BP Method Automatic  Patient Position (if appropriate) Lying  Oxygen Therapy  SpO2 100 %  O2 Device Nasal Cannula  O2 Flow Rate (L/min) 3 L/min  Dialysis Weight  Weight 105.4 kg  Type of Weight Post-Dialysis  Post-Hemodialysis Assessment  Rinseback Volume (mL) 250 mL  KECN 79.6 V  Dialyzer Clearance Lightly streaked  Duration of HD Treatment -hour(s) 3.5 hour(s)  Hemodialysis Intake (mL) 500 mL  UF Total -Machine (mL) 3514 mL  Net UF (mL) 3014 mL  Tolerated HD Treatment Yes  AVG/AVF Arterial Site Held (minutes) 10 minutes  AVG/AVF Venous Site Held (minutes) 10 minutes  Education / Care Plan  Dialysis Education Provided Yes  Documented Education in Care Plan Yes

## 2018-03-30 NOTE — Consult Note (Signed)
Reason for Consult: Altered mental status with hypercapnic respiratory failure and end-stage renal disease requiring emergent hemodialysis Referring Physician: Dr. [name] is an 52 y.o. female.  HPI: Kimberly Montgomery is a 52 year old female with a complicated past medical history to include hypertension, hyperlipidemia, coronary artery disease, congestive heart failure, anxiety/depression, CVA, seizures, end-stage renal disease on Monday Wednesday Friday hemodialysis at [facility], was [facility] secondary to altered mental status.  Patient was unable to give history in the emergency department mental status was altered, speech was slow and slurred, CT scan of the head did not reveal any acute findings however advanced atrophy was noted, chest x-ray revealed cardiomegaly with pulmonary edema, urinalysis was positive for UTI, arterial blood gas revealed hypercapnic respiratory failure requiring BiPAP, renal labs reveal a potassium of 4.8, BUN 29, creatinine 5.57 with an anion gap of 12.  Normal white count at 7.3 and hemoglobin 12.3.  Patient was seen by nephrology, transferred to the intensive care unit on BiPAP for hemodialysis  Past Medical History:  Diagnosis Date  . Anginal pain (HCC)   . Anxiety   . Bipolar disorder (HCC)   . CAD (coronary artery disease)   . CHF (congestive heart failure) (HCC)   . Chronic lower back pain   . Depression   . Endometriosis   . ESRD (end stage renal disease) on dialysis (HCC)    "[facility]; [address]; [place]; TTS" (01/19/2016)  . Gastroparesis   . GERD (gastroesophageal reflux disease)   . Heart attack (HCC) 08/2017   stent placed, [facility]  . History of blood transfusion "several"   "my blood would get low; low RBC"  . History of hiatal hernia   . HLD (hyperlipidemia)   . Hypertension   . Migraine    "monthly" (01/19/2016)  . Myocardial infarction (HCC) 2017   "~ 3 wks ago" (01/19/2016)  . Renal disorder   . Renal  insufficiency   . Seizures (HCC) 07/2015   "I've only had the 1; don't know what from" (01/19/2016)  . Stroke (HCC)   . Type II diabetes mellitus (HCC)     Past Surgical History:  Procedure Laterality Date  . A/V FISTULAGRAM Left 11/27/2017   Procedure: A/V FISTULAGRAM;  Surgeon: Annice Needy, MD;  Location: [facility] INVASIVE CV LAB;  Service: Cardiovascular;  Laterality: Left;  . ABDOMINAL HYSTERECTOMY     "partial"  . AV FISTULA PLACEMENT Right 08/23/2016   Procedure: ARTERIOVENOUS (AV) FISTULA CREATION  ( BRACHIAL CEPHALIC );  Surgeon: Renford Dills, MD;  Location: [facility] ORS;  Service: Vascular;  Laterality: Right;  . BELOW KNEE LEG AMPUTATION Right 2010?  [name] CARDIAC CATHETERIZATION Right 07/10/2015   Procedure: Left Heart Cath and Coronary Angiography;  Surgeon: Laurier Nancy, MD;  Location: [facility];  Service: Cardiovascular;  Laterality: Right;  . CARDIAC CATHETERIZATION Right 09/14/2015   Procedure: Left Heart Cath and Coronary Angiography;  Surgeon: Laurier Nancy, MD;  Location: [facility];  Service: Cardiovascular;  Laterality: Right;  . CARDIAC CATHETERIZATION N/A 09/14/2015   Procedure: Coronary Stent Intervention;  Surgeon: Alwyn Pea, MD;  Location: [facility];  Service: Cardiovascular;  Laterality: N/A;  . CARDIAC CATHETERIZATION Right 11/20/2015   Procedure: Left Heart Cath and Coronary Angiography;  Surgeon: Laurier Nancy, MD;  Location: [facility];  Service: Cardiovascular;  Laterality: Right;  . CATARACT EXTRACTION, BILATERAL    . CORONARY ANGIOPLASTY WITH STENT PLACEMENT  <  2017   @ [facility]/notes 07/03/2013  . CORONARY ANGIOPLASTY WITH STENT PLACEMENT    . CORONARY STENT INTERVENTION N/A 08/28/2017   Procedure: CORONARY STENT INTERVENTION;  Surgeon: Alwyn Peaallwood, Dwayne D, MD;  Location: [facility];  Service: Cardiovascular;  Laterality: N/A;  . DIALYSIS/PERMA CATHETER REMOVAL N/A 11/12/2016   Procedure: DIALYSIS/PERMA  CATHETER REMOVAL;  Surgeon: Renford DillsSchnier, Gregory G, MD;  Location: [facility];  Service: Cardiovascular;  Laterality: N/A;  . HYSTEROTOMY    . PERIPHERAL VASCULAR CATHETERIZATION N/A 08/30/2015   Procedure: Dialysis/Perma Catheter Insertion;  Surgeon: Annice NeedyJason S Dew, MD;  Location: [facility];  Service: Cardiovascular;  Laterality: N/A;  . PERIPHERAL VASCULAR CATHETERIZATION N/A 01/01/2016   Procedure: Dialysis/Perma Catheter Insertion;  Surgeon: Annice NeedyJason S Dew, MD;  Location: [facility];  Service: Cardiovascular;  Laterality: N/A;  . PERITONEAL CATHETER INSERTION  11/03/2013   [name]/notes 11/03/2013  . PERITONEAL CATHETER REMOVAL  01/01/2016   "took the one from May out; put new PD cath in" (01/19/2016)  . PERITONEAL CATHETER REMOVAL  11/03/2013; 02/11/2014   [name]/notes 11/03/2013; Removal of tunneled catheter/notes 02/11/2014  . RIGHT/LEFT HEART CATH AND CORONARY ANGIOGRAPHY N/A 08/28/2017   Procedure: RIGHT/LEFT HEART CATH AND CORONARY ANGIOGRAPHY;  Surgeon: Laurier NancyKhan, Shaukat A, MD;  Location: [facility];  Service: Cardiovascular;  Laterality: N/A;  . SALPINGOOPHORECTOMY Right    [name]/notes 07/03/2013  . TEE WITHOUT CARDIOVERSION N/A 06/07/2016   Procedure: TRANSESOPHAGEAL ECHOCARDIOGRAM (TEE);  Surgeon: Chrystie NoseKenneth C Hilty, MD;  [facility];  Service: Cardiovascular;  Laterality: N/A;  . TUBAL LIGATION      Family History  Problem Relation Age of Onset  . CAD Other   . Diabetes Other   . Bipolar disorder Other   . Cervical cancer Mother   . Other Father        heart bypass  . Breast cancer Neg Hx     Social History:  reports that she quit smoking about 2 years ago. Her smoking use included cigarettes. She smoked 0.75 packs per day. She has never used smokeless tobacco. She reports that she does not drink alcohol or use drugs.  Allergies:  Allergies  Allergen Reactions  . Cephalosporins Anaphylaxis    Patient has tolerated meropenem.   [name]. Penicillins Anaphylaxis and  Other (See Comments)    Has patient had a PCN reaction causing immediate rash, facial/tongue/throat swelling, SOB or lightheadedness with hypotension: Yes Has patient had a PCN reaction causing severe rash involving mucus membranes or skin necrosis: No Has patient had a PCN reaction that required hospitalization No Has patient had a PCN reaction occurring within the last 10 years: No If all of the above answers are "NO", then may proceed with Cephalosporin use.  . Reglan [Metoclopramide] Other (See Comments)    Severe EPS (lip, head, body tremors) March 2018  . Risperdal [Risperidone] Other (See Comments)    Severe EPS (lip, head, body tremors) March 2018  . Lamictal [Lamotrigine] Other (See Comments)    Reaction:  Hallucinations  . Phenergan [Promethazine Hcl] Nausea And Vomiting    Sensitivity to medicine  . Sulfamethoxazole-Trimethoprim     Other reaction(s): hyperkalemia  . Sulfasalazine Other (See Comments)    Reaction:  Unknown   . Pravastatin Other (See Comments)    Reaction:  Muscle pain   . Sulfa Antibiotics Other (See Comments)    Reaction:  Unknown     Medications: I have reviewed the patient's current medications.  Results for  orders placed or performed during the hospital encounter of 03/30/18 (from the past 48 hour(s))  Comprehensive metabolic panel     Status: Abnormal   Collection Time: 03/30/18  8:28 AM  Result Value Ref Range   Sodium 137 135 - 145 mmol/L   Potassium 4.8 3.5 - 5.1 mmol/L    Comment: HEMOLYSIS AT THIS LEVEL MAY AFFECT RESULT   Chloride 101 98 - 111 mmol/L   CO2 24 22 - 32 mmol/L   Glucose, Bld 110 (H) 70 - 99 mg/dL   BUN 29 (H) 6 - 20 mg/dL   Creatinine, Ser 6.57 (H) 0.44 - 1.00 mg/dL   Calcium 8.1 (L) 8.9 - 10.3 mg/dL   Total Protein 6.6 6.5 - 8.1 g/dL   Albumin 3.3 (L) 3.5 - 5.0 g/dL   AST 21 15 - 41 U/L   ALT 6 0 - 44 U/L   Alkaline Phosphatase 142 (H) 38 - 126 U/L   Total Bilirubin 0.8 0.3 - 1.2 mg/dL   GFR calc non Af Amer 8 (L) >60  mL/min   GFR calc Af Amer 9 (L) >60 mL/min   Anion gap 12 5 - 15    Comment: Performed at [facility], [address] [place], [place] [address]  CBC     Status: Abnormal   Collection Time: 03/30/18  8:28 AM  Result Value Ref Range   WBC 7.3 4.0 - 10.5 K/uL   RBC 4.33 3.87 - 5.11 MIL/uL   Hemoglobin 12.3 12.0 - 15.0 g/dL   HCT 29.5 28.4 - 13.2 %   MCV 97.9 80.0 - 100.0 fL   MCH 28.4 26.0 - 34.0 pg   MCHC 29.0 (L) 30.0 - 36.0 g/dL   RDW 44.0 (H) 10.2 - 72.5 %   Platelets 183 150 - 400 K/uL   nRBC 0.0 0.0 - 0.2 %    Comment: Performed at [facility], [address] [place], [place] [address]  Blood gas, venous     Status: Abnormal   Collection Time: 03/30/18  9:58 AM  Result Value Ref Range   FIO2 0.21    Delivery systems ROOM AIR    pH, Ven 7.25 7.250 - 7.430   pCO2, Ven 71 (HH) 44.0 - 60.0 mmHg    Comment: CRITICAL RESULT CALLED TO, READ BACK BY AND VERIFIED WITH: JENNIFER RN @ 1013 ON 03/30/18 BL    pO2, Ven 62.0 (H) 32.0 - 45.0 mmHg   Bicarbonate 31.1 (H) 20.0 - 28.0 mmol/L   Acid-Base Excess 1.8 0.0 - 2.0 mmol/L   O2 Saturation 87.1 %   Patient temperature 37.0    Collection site VENOUS    Sample type VENOUS     Comment: Performed at [facility], [address] [place], [place] [address]  Ammonia     Status: None   Collection Time: 03/30/18  9:58 AM  Result Value Ref Range   Ammonia 28 9 - 35 umol/L    Comment: Performed at [facility], [address] [place], [place] [address]  Urinalysis, Complete w Microscopic     Status: Abnormal   Collection Time: 03/30/18 10:14 AM  Result Value Ref Range   Color, Urine AMBER (A) YELLOW   APPearance TURBID (A) CLEAR   Specific Gravity, Urine 1.019 1.005 - 1.030   pH 6.0 5.0 - 8.0   Glucose, UA NEGATIVE NEGATIVE mg/dL   Hgb urine dipstick MODERATE (A) NEGATIVE   Bilirubin Urine NEGATIVE NEGATIVE  Ketones, ur NEGATIVE NEGATIVE mg/dL   Protein, ur >=161 (A) NEGATIVE mg/dL    Nitrite NEGATIVE NEGATIVE   Leukocytes, UA MODERATE (A) NEGATIVE   RBC / HPF >50 (H) 0 - 5 RBC/hpf   WBC, UA >50 (H) 0 - 5 WBC/hpf   Bacteria, UA MANY (A) NONE SEEN   Squamous Epithelial / LPF 6-10 0 - 5   WBC Clumps PRESENT     Comment: Performed at [facility], [address], [place], [place] [address]  Urine Drug Screen, Qualitative ([facility] only)     Status: None   Collection Time: 03/30/18 10:14 AM  Result Value Ref Range   Tricyclic, Ur Screen NONE DETECTED NONE DETECTED   Amphetamines, Ur Screen NONE DETECTED NONE DETECTED   MDMA (Ecstasy)Ur Screen NONE DETECTED NONE DETECTED   Cocaine Metabolite,Ur [place] NONE DETECTED NONE DETECTED   Opiate, Ur Screen NONE DETECTED NONE DETECTED   Phencyclidine (PCP) Ur S NONE DETECTED NONE DETECTED   Cannabinoid 50 Ng, Ur [place] NONE DETECTED NONE DETECTED   Barbiturates, Ur Screen NONE DETECTED NONE DETECTED   Benzodiazepine, Ur Scrn NONE DETECTED NONE DETECTED   Methadone Scn, Ur NONE DETECTED NONE DETECTED    Comment: (NOTE) Tricyclics + metabolites, urine    Cutoff 1000 ng/mL Amphetamines + metabolites, urine  Cutoff 1000 ng/mL MDMA (Ecstasy), urine              Cutoff 500 ng/mL Cocaine Metabolite, urine          Cutoff 300 ng/mL Opiate + metabolites, urine        Cutoff 300 ng/mL Phencyclidine (PCP), urine         Cutoff 25 ng/mL Cannabinoid, urine                 Cutoff 50 ng/mL Barbiturates + metabolites, urine  Cutoff 200 ng/mL Benzodiazepine, urine              Cutoff 200 ng/mL Methadone, urine                   Cutoff 300 ng/mL The urine drug screen provides only a preliminary, unconfirmed analytical test result and should not be used for non-medical purposes. Clinical consideration and professional judgment should be applied to any positive drug screen result due to possible interfering substances. A more specific alternate chemical method must be used in order to obtain a confirmed analytical result. Gas chromatography /  mass spectrometry (GC/MS) is the preferred confirmat ory method. Performed at [facility], [address] [place], [place] [address]     Ct Head Wo Contrast  Result Date: 03/30/2018 CLINICAL DATA:  Altered level of consciousness, unexplained EXAM: CT HEAD WITHOUT CONTRAST TECHNIQUE: Contiguous axial images were obtained from the base of the skull through the vertex without intravenous contrast. COMPARISON:  11/24/2017 FINDINGS: Brain: No evidence of acute infarction, hemorrhage, hydrocephalus, extra-axial collection or mass lesion/mass effect. Age advanced volume loss with the cerebellum appears more affected than the cerebrum Vascular: Extensive atherosclerotic calcification Skull: No acute finding Sinuses/Orbits: Bilateral cataract resection. IMPRESSION: 1. No acute finding or change from prior. 2. Age advanced atrophy. Electronically Signed   By: Marnee Spring M.D.   On: 03/30/2018 08:57   Dg Chest Portable 1 View  Result Date: 03/30/2018 CLINICAL DATA:  Altered mental status. EXAM: PORTABLE CHEST 1 VIEW COMPARISON:  02/24/2018. FINDINGS: Cardiomegaly with mild interstitial prominence and small right pleural effusion consistent with CHF. Findings are less prominent than noted on  prior chest x-ray of 02/24/2018. No pneumothorax. IMPRESSION: Findings suggesting mild congestive heart failure with pulmonary interstitial edema and right pleural effusion. Findings are less prominent than noted on prior chest x-ray of 02/24/2018. Electronically Signed   By: Maisie Fus  Register   On: 03/30/2018 09:24    ROS   Unable to obtain  Blood pressure 122/74, pulse (!) 52, temperature 97.6 F (36.4 C), temperature source Axillary, resp. rate 17, height 5\' 9"  (1.753 m), weight 106.7 kg, last menstrual period 03/16/2015, SpO2 100 %.   Physical Exam Vital signs: Please see the above listed vital signs HEENT: Patient is presently on BiPAP, limited oral exam, trachea midline, no thyromegaly  appreciated Cardiovascular: Bradycardia noted Abdominal: Positive bowel sounds, soft exam Pulmonary: Clear to auscultation Extremities: No clubbing, cyanosis or edema noted.  Fistula on right arm noted Neurologic: Patient is now awake, alert, responsive and communicating  Assessment/Plan:  Altered mental status.  Most likely secondary to hypercapnic respiratory failure.  Significantly improved on BiPAP.  Underlying physiology appears to be pulmonary edema, most likely secondary to renal failure.  Patient does have a history of congestive heart failure in addition.  Most recent echocardiogram performed in May 2019 revealed preserved ejection fraction with diastolic dysfunction, mitral stenosis.  Patient also does take multiple medications which may suppress respiratory drive to include gabapentin, Klonopin, Vicodin and Ativan.  Presently on hold.  End-stage renal disease.  Seen by nephrology, pending hemodialysis  UTI.  Antibiotic coverage  Seizure disorder.  Is on Keppra and Trileptal  Prior history of coronary artery disease and cerebrovascular disease on aspirin and Plavix  Kimberly Kindred, DO  Kimberly Montgomery 03/30/2018, 12:02 PM

## 2018-03-30 NOTE — Progress Notes (Signed)
Pre HD assessment    03/30/18 1930  Vital Signs  Temp 97.9 F (36.6 C)  Temp Source Oral  Pulse Rate (!) 58  Pulse Rate Source Monitor  Resp 17  BP (!) 109/57  BP Location Left Arm  BP Method Automatic  Patient Position (if appropriate) Lying  Oxygen Therapy  SpO2 100 %  O2 Device Nasal Cannula  O2 Flow Rate (L/min) 3 L/min  Pain Assessment  Pain Scale 0-10  Pain Score 0  Pain Type Chronic pain  Pain Location Buttocks  Pain Descriptors / Indicators Aching  Pain Onset On-going  Pain Intervention(s) RN made aware  Dialysis Weight  Weight 107.4 kg  Type of Weight Pre-Dialysis  Time-Out for Hemodialysis  What Procedure? HD  Pt Identifiers(min of two) First/Last Name;MRN/Account#  Correct Site? Yes  Correct Side? Yes  Correct Procedure? Yes  Consents Verified? Yes  Rad Studies Available? N/A  Safety Precautions Reviewed? Yes  Biochemist, clinical Number  (3A)  Station Number  (bedside ICU 03)  UF/Alarm Test Passed  Conductivity: Meter 13.6  Conductivity: Machine  13.7  pH 7.6  Reverse Osmosis WRO #1  Normal Saline Lot Number [id number]  Dialyzer Lot Number [id number]  Disposable Set Lot Number [id number]  Machine Temperature 98.6 F (37 C)  Immunologist and Audible Yes  Blood Lines Intact and Secured Yes  Pre Treatment Patient Checks  Vascular access used during treatment Fistula  Hepatitis B Surface Antigen Results Negative  Date Hepatitis B Surface Antigen Drawn 01/12/18  Hepatitis B Surface Antibody 11 (11>10)  Date Hepatitis B Surface Antibody Drawn 01/12/18  Hemodialysis Consent Verified Yes  Hemodialysis Standing Orders Initiated Yes  ECG (Telemetry) Monitor On Yes  Prime Ordered Normal Saline  Length of  DialysisTreatment -hour(s) 3.5 Hour(s)  Dialyzer Elisio 17H NR  Dialysate 2K, 2.5 Ca  Dialysis Anticoagulant None  Dialysate Flow Ordered 600  Blood Flow Rate Ordered 400 mL/min  Ultrafiltration Goal 3 Liters  Dialysis Blood Pressure Support  Ordered Normal Saline  Education / Care Plan  Dialysis Education Provided Yes  Documented Education in Care Plan Yes

## 2018-03-30 NOTE — ED Triage Notes (Signed)
Pt to ER via EMS from [facility] with reports of altered mental status.  Staff reports that pt was crawling on floor.  Pt denies falling or injury.  Pt alert and oriented to name and place.  Pt wears O2 2L at all times, EMS reports when they arrived she was not on O2 and sats were in 70s.  They placed her on 2L [place] and her sats returned to mid 90s.  PT is due for dialysis today.

## 2018-03-30 NOTE — Progress Notes (Signed)
Post HD assessment    03/30/18 2336  Neurological  Level of Consciousness Alert  Orientation Level Oriented to person;Oriented to situation;Disoriented to place;Disoriented to time  Respiratory  Respiratory Pattern Regular;Unlabored  Chest Assessment Chest expansion symmetrical  Cardiac  Pulse Regular  ECG Monitor Yes  Cardiac Rhythm NSR  Ectopy Unifocal PVC's  Ectopy Frequency Frequent  Vascular  R Radial Pulse +2  L Radial Pulse +2  Edema Generalized  Integumentary  Integumentary (WDL) X  Skin Color Appropriate for ethnicity  Musculoskeletal  Musculoskeletal (WDL) X  Generalized Weakness Yes  Assistive Device None  GU Assessment  Genitourinary (WDL) X  Genitourinary Symptoms  (HD)  Psychosocial  Psychosocial (WDL) WDL  Emotional support given Given to patient

## 2018-03-30 NOTE — H&P (Addendum)
[facility] [place] [facility]   PATIENT NAME: Kimberly Montgomery    MR#:  [id number]  DATE OF BIRTH:  October 11, 1965  DATE OF ADMISSION:  03/30/2018  PRIMARY CARE PHYSICIAN: Dorothey Baseman, MD   REQUESTING/REFERRING PHYSICIAN: Dr Willy Eddy  CHIEF COMPLAINT:   Chief Complaint  Patient presents with  . Altered Mental Status    HISTORY OF PRESENT ILLNESS:  Kimberly Montgomery  is a 52 y.o. female sent in [facility] for altered mental status.  Patient known by me from previous hospitalizations.  Patient unable to give any history at this time.  Answers a few questions.  Able to move her extremities on her own.  Speech is slowed and a little slurred.  In the ER, she was found to have an elevated PCO2.  Nurse stated the urine also looked dark but the urine analysis has not resulted yet.  PAST MEDICAL HISTORY:   Past Medical History:  Diagnosis Date  . Anginal pain (HCC)   . Anxiety   . Bipolar disorder (HCC)   . CAD (coronary artery disease)   . CHF (congestive heart failure) (HCC)   . Chronic lower back pain   . Depression   . Endometriosis   . ESRD (end stage renal disease) on dialysis (HCC)    "[facility]; [address]; [place]; TTS" (01/19/2016)  . Gastroparesis   . GERD (gastroesophageal reflux disease)   . Heart attack (HCC) 08/2017   stent placed, [facility]  . History of blood transfusion "several"   "my blood would get low; low RBC"  . History of hiatal hernia   . HLD (hyperlipidemia)   . Hypertension   . Migraine    "monthly" (01/19/2016)  . Myocardial infarction (HCC) 2017   "~ 3 wks ago" (01/19/2016)  . Renal disorder   . Renal insufficiency   . Seizures (HCC) 07/2015   "I've only had the 1; don't know what from" (01/19/2016)  . Stroke (HCC)   . Type II diabetes mellitus (HCC)     PAST SURGICAL HISTORY:   Past Surgical History:  Procedure Laterality Date  . A/V FISTULAGRAM Left 11/27/2017   Procedure: A/V  FISTULAGRAM;  Surgeon: Annice Needy, MD;  Location: [facility] INVASIVE CV LAB;  Service: Cardiovascular;  Laterality: Left;  . ABDOMINAL HYSTERECTOMY     "partial"  . AV FISTULA PLACEMENT Right 08/23/2016   Procedure: ARTERIOVENOUS (AV) FISTULA CREATION  ( BRACHIAL CEPHALIC );  Surgeon: Renford Dills, MD;  Location: [facility] ORS;  Service: Vascular;  Laterality: Right;  . BELOW KNEE LEG AMPUTATION Right 2010?  [name] CARDIAC CATHETERIZATION Right 07/10/2015   Procedure: Left Heart Cath and Coronary Angiography;  Surgeon: Laurier Nancy, MD;  Location: [facility] INVASIVE CV LAB;  Service: Cardiovascular;  Laterality: Right;  . CARDIAC CATHETERIZATION Right 09/14/2015   Procedure: Left Heart Cath and Coronary Angiography;  Surgeon: Laurier Nancy, MD;  Location: [facility] INV[facility] CV [facility];  Service: Cardiovascular;  Laterality: Right;  . CARDIAC CATHETERIZATION N/A 09/14/2015   Procedure: Coronary Stent Intervention;  Surgeon: Alwyn Pea, MD;  Location: [facility] INVAS[facility] CV [facility];  Service: Cardiovascular;  Laterality: N/A;  . CARDIAC CATHETERIZATION Right 11/20/2015   Procedure: Left Heart Cath and Coronary Angiography;  Surgeon: Laurier Nancy, MD;  Location: [facility] INV[facility] CV [facility];  Service: Cardiovascular;  Laterality: Right;  . CATARACT EXTRACTION, BILATERAL    . CORONARY ANGIOPLASTY WITH STENT PLACEMENT  <2017   @ [facility]/notes 07/03/2013  . CORONARY ANGIOPLASTY WITH  STENT PLACEMENT    . CORONARY STENT INTERVENTION N/A 08/28/2017   Procedure: CORONARY STENT INTERVENTION;  Surgeon: Alwyn Pea, MD;  Location: [facility] INV[facility];  Service: Cardiovascular;  Laterality: N/A;  . DIALYSIS/PERMA CATHETER REMOVAL N/A 11/12/2016   Procedure: DIALYSIS/PERMA CATHETER REMOVAL;  Surgeon: Renford Dills, MD;  Location: [facility];  Service: Cardiovascular;  Laterality: N/A;  . HYSTEROTOMY    . PERIPHERAL VASCULAR CATHETERIZATION N/A 08/30/2015   Procedure: Dialysis/Perma Catheter Insertion;  Surgeon: Annice Needy,  MD;  Location: [facility] INV[facility] CV [facility];  Service: Cardiovascular;  Laterality: N/A;  . PERIPHERAL VASCULAR CATHETERIZATION N/A 01/01/2016   Procedure: Dialysis/Perma Catheter Insertion;  Surgeon: Annice Needy, MD;  Location: [facility] INV[facility] CV [facility];  Service: Cardiovascular;  Laterality: N/A;  . PERITONEAL CATHETER INSERTION  11/03/2013   [name] 11/03/2013  . PERITONEAL CATHETER REMOVAL  01/01/2016   "took the one from May out; put new PD cath in" (01/19/2016)  . PERITONEAL CATHETER REMOVAL  11/03/2013; 02/11/2014   [name] 11/03/2013; Removal of tunneled catheter/notes 02/11/2014  . RIGHT/LEFT HEART CATH AND CORONARY ANGIOGRAPHY N/A 08/28/2017   Procedure: RIGHT/LEFT HEART CATH AND CORONARY ANGIOGRAPHY;  Surgeon: Laurier Nancy, MD;  Location: [facility];  Service: Cardiovascular;  Laterality: N/A;  . SALPINGOOPHORECTOMY Right    [name] 07/03/2013  . TEE WITHOUT CARDIOVERSION N/A 06/07/2016   Procedure: TRANSESOPHAGEAL ECHOCARDIOGRAM (TEE);  Surgeon: Chrystie Nose, MD;  Location: [facility] ENDOSCOPY;  Service: Cardiovascular;  Laterality: N/A;  . TUBAL LIGATION      SOCIAL HISTORY:   Social History   Tobacco Use  . Smoking status: Former Smoker    Packs/day: 0.75    Types: Cigarettes    Last attempt to quit: 07/01/2015    Years since quitting: 2.7  . Smokeless tobacco: Never Used  Substance Use Topics  . Alcohol use: No    Alcohol/week: 0.0 standard drinks    FAMILY HISTORY:   Family History  Problem Relation Age of Onset  . CAD Other   . Diabetes Other   . Bipolar disorder Other   . Cervical cancer Mother   . Other Father        heart bypass  . Breast cancer Neg Hx     DRUG ALLERGIES:   Allergies  Allergen Reactions  . Cephalosporins Anaphylaxis    Patient has tolerated meropenem.   [name] Penicillins Anaphylaxis and Other (See Comments)    Has patient had a PCN reaction causing immediate rash, facial/tongue/throat swelling, SOB or lightheadedness with hypotension: Yes Has  patient had a PCN reaction causing severe rash involving mucus membranes or skin necrosis: No Has patient had a PCN reaction that required hospitalization No Has patient had a PCN reaction occurring within the last 10 years: No If all of the above answers are "NO", then may proceed with Cephalosporin use.  . Reglan [Metoclopramide] Other (See Comments)    Severe EPS (lip, head, body tremors) March 2018  . Risperdal [Risperidone] Other (See Comments)    Severe EPS (lip, head, body tremors) March 2018  . Lamictal [Lamotrigine] Other (See Comments)    Reaction:  Hallucinations  . Phenergan [Promethazine Hcl] Nausea And Vomiting    Sensitivity to medicine  . Sulfamethoxazole-Trimethoprim     Other reaction(s): hyperkalemia  . Sulfasalazine Other (See Comments)    Reaction:  Unknown   . Pravastatin Other (See Comments)    Reaction:  Muscle pain   . Sulfa Antibiotics Other (See Comments)  Reaction:  Unknown     REVIEW OF SYSTEMS:   Patient only answered a few questions yes or no.  Unable to explain much more than that.  Cardiovascular: No chest pain Respiratory: Some shortness of breath Gastrointestinal: Some abdominal pain  MEDICATIONS AT HOME:   Prior to Admission medications   Medication Sig Start Date End Date Taking? Authorizing Provider  amLODipine (NORVASC) 10 MG tablet Take 10 mg by mouth daily. (0500)   Yes [provider]  ARIPiprazole (ABILIFY) 5 MG tablet Take 2.5 mg by mouth 2 (two) times daily. (0500 & 1800)   Yes [provider]  aspirin 81 MG chewable tablet Chew 81 mg by mouth daily. (0500)   Yes [provider]  atorvastatin (LIPITOR) 40 MG tablet Take 40 mg by mouth daily at 6 PM.   Yes [provider]  benztropine (COGENTIN) 0.5 MG tablet Take 0.5 mg by mouth 2 (two) times daily. (1300 & 1800)   Yes [provider]  budesonide (PULMICORT) 0.5 MG/2ML nebulizer solution Take 2 mLs (0.5 mg total) by nebulization 2 (two)  times daily. Patient taking differently: Take 0.5 mg by nebulization 2 (two) times daily. (1200 & 2000) 08/30/17  Yes Auburn BilberryPatel, Shreyang, MD  carboxymethylcellul-glycerin (OPTIVE) 0.5-0.9 % ophthalmic solution Apply 1 drop to eye 3 (three) times daily. (0500, 1300, & 1700)   Yes [provider]  clonazePAM (KLONOPIN) 0.5 MG tablet Take 0.25-0.5 mg by mouth See admin instructions. (0500 & 2000) Take  tablet (0.25MG ) by mouth every morning at 0500 & 1 tablet (0.5MG ) by mouth every evening at 2000.   Yes [provider]  clopidogrel (PLAVIX) 75 MG tablet Take 75 mg by mouth daily at 12 noon. (1200)   Yes [provider]  escitalopram (LEXAPRO) 5 MG tablet Take 5 mg by mouth daily at 8 pm. (2100)   Yes [provider]  fluticasone (FLONASE) 50 MCG/ACT nasal spray Place 1 spray into both nostrils daily at 12 noon. (1200)   Yes [provider]  gabapentin (NEURONTIN) 300 MG capsule Take 300 mg by mouth daily at 12 noon. (1200)   Yes [provider]  hydrALAZINE (APRESOLINE) 50 MG tablet Take 50 mg by mouth See admin instructions. (0900, 1300, & 1700)Take 1 tablet (50MG ) by mouth three times daily (Sunday, Tuesday, Thursday & Saturday)  (1300 & 1800) 1 tablet (50MG ) by mouth twice daily (Monday, Wednesday & Friday).   Yes [provider]  insulin aspart (NOVOLOG) 100 UNIT/ML injection Inject 0-9 Units into the skin every 4 (four) hours. Patient taking differently: Inject 0-9 Units into the skin 4 (four) times daily. (0500, 1200, 1700, & 2000) IF BLOOD SUGAR IS LESS THAN 60, call MD. 200-250=2 units, 251-300=4 units, 301-350=6 units, 351-400=8 units, 401-450=9 units, if blood sugar greater than 450, call MD. 08/30/17  Yes Auburn BilberryPatel, Shreyang, MD  isosorbide mononitrate (IMDUR) 30 MG 24 hr tablet Take 90 mg by mouth daily at 12 noon. (1200)   Yes [provider]  lactulose (CHRONULAC) 10 GM/15ML solution Take 10 g by mouth daily at 6 PM.    Yes  [provider]  levETIRAcetam (KEPPRA) 750 MG tablet Take 2 tablets (1,500 mg total) by mouth daily at 12 noon. Patient taking differently: Take 1,500 mg by mouth daily at 12 noon. (1200) 05/20/17  Yes Delfino LovettShah, Vipul, MD  lidocaine (LIDODERM) 5 % Place 1 patch onto the skin at bedtime. (2000)   Yes [provider]  lidocaine (LMX) 4 %  cream Apply 1 application topically every Monday, Wednesday, and Friday with hemodialysis. (0400) To right arm fistula   Yes [provider]  lisinopril (PRINIVIL,ZESTRIL) 10 MG tablet Take 10 mg by mouth daily at 12 noon. (1200)   Yes [provider]  Melatonin 3 MG TABS Take 3 mg by mouth at bedtime. (2000)   Yes [provider]  metoprolol (LOPRESSOR) 50 MG tablet Take 1 tablet (50 mg total) by mouth 2 (two) times daily. Patient taking differently: Take 50 mg by mouth 2 (two) times daily. (0500 & 1800) 09/15/15  Yes Milagros Loll, MD  Multiple Vitamin (MULTIVITAMIN WITH MINERALS) TABS tablet Take 1 tablet by mouth daily. (0500)   Yes [provider]  omeprazole (PRILOSEC) 20 MG capsule Take 20 mg by mouth 2 (two) times daily. (0530 & 1600)   Yes [provider]  OXcarbazepine (TRILEPTAL) 150 MG tablet Take 150 mg by mouth at bedtime. (2000)   Yes [provider]  senna (SENOKOT) 8.6 MG TABS tablet Take 2 tablets by mouth 2 (two) times daily. (1300 & 1800)   Yes [provider]  sevelamer carbonate (RENVELA) 800 MG tablet Take 800 mg by mouth 4 (four) times daily -  before meals and at bedtime. (0530, 1230, 1700, & 2000)   Yes [provider]  Tiotropium Bromide Monohydrate (SPIRIVA RESPIMAT) 2.5 MCG/ACT AERS Inhale 1 puff into the lungs daily at 12 noon. (1200)   Yes [provider]  torsemide (DEMADEX) 100 MG tablet Take 100 mg by mouth daily at 12 noon. (1200)   Yes [provider]  vitamin C (ASCORBIC ACID) 250 MG tablet Take 500 mg by mouth 2 (two) times  daily. (0500 & 1800)   Yes [provider]  acetaminophen (TYLENOL) 500 MG tablet Take 500 mg by mouth every 6 (six) hours as needed for moderate pain or fever.    [provider]  albuterol (PROVENTIL) (2.5 MG/3ML) 0.083% nebulizer solution Take 2.5 mg by nebulization every 4 (four) hours as needed for wheezing or shortness of breath.    [provider]  bisacodyl (DULCOLAX) 10 MG suppository Place 10 mg rectally as needed for moderate constipation.    [provider]  Carboxymethylcellulose Sodium (REFRESH LIQUIGEL) 1 % GEL Apply 1 drop to eye daily.    [provider]  HYDROcodone-acetaminophen (NORCO/VICODIN) 5-325 MG tablet Take 1 tablet by mouth every 6 (six) hours as needed for moderate pain or severe pain. 02/26/18   Houston Siren, MD  LORazepam (ATIVAN) 0.5 MG tablet Take 0.5 mg by mouth every 6 (six) hours as needed for anxiety.    [provider]  nitroGLYCERIN (NITROSTAT) 0.4 MG SL tablet Place 0.4 mg under the tongue every 5 (five) minutes x 3 doses as needed for chest pain.     [provider]  ondansetron (ZOFRAN ODT) 4 MG disintegrating tablet Take 1 tablet (4 mg total) by mouth every 6 (six) hours as needed for nausea or vomiting. 07/16/15   Sharyn Creamer, MD      VITAL SIGNS:  Blood pressure (!) 124/57, pulse (!) 52, temperature (!) 97.2 F (36.2 C), temperature source Oral, resp. rate 19, height 5\' 8"  (1.727 m), weight 106 kg, last menstrual period 03/16/2015, SpO2 97 %.  PHYSICAL EXAMINATION:  GENERAL:  52 y.o.-year-old patient lying in the bed with no acute distress.  EYES: Pupils equal, round, reactive to light and accommodation. No scleral icterus. HEENT: Head atraumatic, normocephalic. Oropharynx and nasopharynx  clear.  NECK:  Supple, no jugular venous distention. No thyroid enlargement, no tenderness.  LUNGS: Decreased breath sounds bilaterally, no wheezing, rales,rhonchi or crepitation. No use of accessory  muscles of respiration.  CARDIOVASCULAR: S1, S2 normal. No murmurs, rubs, or gallops.  ABDOMEN: Soft, diffuse abdominal tenderness, nondistended. Bowel sounds present. No organomegaly or mass.  EXTREMITIES: No left lower extremity pedal edema.  No cyanosis, or clubbing.  NEUROLOGIC: Patient moves her extremities on her own. PSYCHIATRIC: The patient is lethargic.  Only answers a few questions. SKIN: No rash, lesion, or ulcer.   LABORATORY PANEL:   CBC Recent Labs  Lab 03/30/18 0828  WBC 7.3  HGB 12.3  HCT 42.4  PLT 183   ------------------------------------------------------------------------------------------------------------------  Chemistries  Recent Labs  Lab 03/30/18 0828  NA 137  K 4.8  CL 101  CO2 24  GLUCOSE 110*  BUN 29*  CREATININE 5.57*  CALCIUM 8.1*  AST 21  ALT 6  ALKPHOS 142*  BILITOT 0.8   ------------------------------------------------------------------------------------------------------------------   RADIOLOGY:  Ct Head Wo Contrast  Result Date: 03/30/2018 CLINICAL DATA:  Altered level of consciousness, unexplained EXAM: CT HEAD WITHOUT CONTRAST TECHNIQUE: Contiguous axial images were obtained from the base of the skull through the vertex without intravenous contrast. COMPARISON:  11/24/2017 FINDINGS: Brain: No evidence of acute infarction, hemorrhage, hydrocephalus, extra-axial collection or mass lesion/mass effect. Age advanced volume loss with the cerebellum appears more affected than the cerebrum Vascular: Extensive atherosclerotic calcification Skull: No acute finding Sinuses/Orbits: Bilateral cataract resection. IMPRESSION: 1. No acute finding or change from prior. 2. Age advanced atrophy. Electronically Signed   By: Marnee Spring M.D.   On: 03/30/2018 08:57   Dg Chest Portable 1 View  Result Date: 03/30/2018 CLINICAL DATA:  Altered mental status. EXAM: PORTABLE CHEST 1 VIEW COMPARISON:  02/24/2018. FINDINGS: Cardiomegaly with mild  interstitial prominence and small right pleural effusion consistent with CHF. Findings are less prominent than noted on prior chest x-ray of 02/24/2018. No pneumothorax. IMPRESSION: Findings suggesting mild congestive heart failure with pulmonary interstitial edema and right pleural effusion. Findings are less prominent than noted on prior chest x-ray of 02/24/2018. Electronically Signed   By: Maisie Fus  Register   On: 03/30/2018 09:24    EKG:   Ordered by me  IMPRESSION AND PLAN:   1.  Acute metabolic encephalopathy.  The patient's PCO2 is elevated at 71 so the patient will be placed on a trial of BiPAP.  Still waiting for urine analysis and ammonia level.  The patient does take numerous medications which could cause altered mental status.  I will hold gabapentin, Klonopin, Vicodin and Ativan.  Hesitant on holding the other psychiatric or seizure medications currently.  Initial CT scan of the head was negative.  Case discussed with nephrology and urinary tract infection was the source of a similar presentation.  Still awaiting urine prior to starting antibiotics. 2.  Acute hypercarbic respiratory failure.  Trial of BiPAP.  The patient chronically wears oxygen at home 2 L 3.  Bradycardia hold metoprolol 4.  Seizure history.  I do not think she is post ictal at this point.  Continue seizure medications Keppra and Trileptal.  If patient's mental status does not improve after PCO2 improved can consider neurology consultation.  Can consider MRI of the brain and EEG if mental status does not improve with BiPAP. 5.  Type 2 diabetes mellitus put on sliding scale 6.  End-stage renal disease on dialysis. 7.  Chronic diastolic congestive heart failure.  Dialysis to  manage fluid 8.  History of CAD and CVA on aspirin and Plavix.  We will give aspirin rectally for right now.   9.  Abdominal pain with palpation.  Get a right upper quadrant sonogram.  Can consider CT scan of the abdomen if that is negative.  Still  waiting ammonia level also.   All the records are reviewed and case discussed with ED provider. Management plans discussed with the patient, and she is in agreement.  I called the patient's daughter and left a message but she has not called me back yet.  Case discussed with critical care specialist and nephrologist.  All radiological and laboratory data reviewed personally by me.  CODE STATUS: Full code from last hospitalization  TOTAL TIME TAKING CARE OF THIS PATIENT: 50 minutes.  Patient will be admitted to the CCU stepdown for BiPAP.  Patient currently critically ill.   Alford Highland M.D on 03/30/2018 at 10:38 AM  Between 7am to 6pm - Pager - [phone]  After 6pm call admission pager [phone]  [facility]  [phone]  CC: Primary care physician; Dorothey Baseman, MD

## 2018-03-30 NOTE — Progress Notes (Signed)
HD tx end    03/30/18 2330  Vital Signs  Pulse Rate 63  Pulse Rate Source Monitor  Resp 16  BP (!) 123/98  BP Location Left Arm  BP Method Automatic  Patient Position (if appropriate) Lying  Oxygen Therapy  SpO2 100 %  O2 Device Nasal Cannula  O2 Flow Rate (L/min) 3 L/min  During Hemodialysis Assessment  Dialysis Fluid Bolus Normal Saline  Bolus Amount (mL) 250 mL  Intra-Hemodialysis Comments Tx completed

## 2018-03-30 NOTE — Progress Notes (Signed)
[facility]  ROUNDING NOTE   Subjective:   Kimberly Montgomery admitted to [facility] on 03/30/2018 for ams  Patient unable to give history due to delirium.   Last hemodialysis was Friday. Currently at [facility].   Placed on BIPAP  Objective:  Vital signs in last 24 hours:  Temp:  [97.2 F (36.2 C)] 97.2 F (36.2 C) (12/30 0810) Pulse Rate:  [50-52] 52 (12/30 1030) Resp:  [9-19] 19 (12/30 1100) BP: (92-124)/(43-83) 108/59 (12/30 1100) SpO2:  [55 %-100 %] 100 % (12/30 1100) Weight:  [106 kg] 106 kg (12/30 0811)  Weight change:  Filed Weights   03/30/18 0811  Weight: 106 kg    Intake/Output: No intake/output data recorded.   Intake/Output this shift:  No intake/output data recorded.  Physical Exam: General: Respiratory distress  Head: BIPAP  Eyes: Anicteric, PERRL  Neck: Supple, trachea midline  Lungs:  Clear to auscultation  Heart: Regular rate and rhythm  Abdomen:  Soft, nontender,   Extremities: Right BKA  Neurologic: Nonfocal, moving all four extremities  Skin: No lesions  Access: Right AVF    Basic Metabolic Panel: Recent Labs  Lab 03/30/18 0828  NA 137  K 4.8  CL 101  CO2 24  GLUCOSE 110*  BUN 29*  CREATININE 5.57*  CALCIUM 8.1*    Liver Function Tests: Recent Labs  Lab 03/30/18 0828  AST 21  ALT 6  ALKPHOS 142*  BILITOT 0.8  PROT 6.6  ALBUMIN 3.3*   No results for input(s): LIPASE, AMYLASE in the last 168 hours. Recent Labs  Lab 03/30/18 0958  AMMONIA 28    CBC: Recent Labs  Lab 03/30/18 0828  WBC 7.3  HGB 12.3  HCT 42.4  MCV 97.9  PLT 183    Cardiac Enzymes: No results for input(s): CKTOTAL, CKMB, CKMBINDEX, TROPONINI in the last 168 hours.  BNP: Invalid input(s): POCBNP  CBG: No results for input(s): GLUCAP in the last 168 hours.  Microbiology: Results for orders placed or performed during the hospital encounter of 02/24/18  Urine culture     Status: Abnormal   Collection Time: 02/24/18 12:35 PM  Result  Value Ref Range Status   Specimen Description   Final    URINE, RANDOM Performed at [facility], [address] [place], [place] [address]    Special Requests   Final    NONE Performed at [facility], [address] [place], [place] [address]    Culture >=100,000 COLONIES/mL KLEBSIELLA PNEUMONIAE (A)  Final   Report Status 02/26/2018 FINAL  Final   Organism ID, Bacteria KLEBSIELLA PNEUMONIAE (A)  Final      Susceptibility   Klebsiella pneumoniae - MIC*    AMPICILLIN >=32 RESISTANT Resistant     CEFAZOLIN <=4 SENSITIVE Sensitive     CEFTRIAXONE <=1 SENSITIVE Sensitive     CIPROFLOXACIN <=0.25 SENSITIVE Sensitive     GENTAMICIN <=1 SENSITIVE Sensitive     IMIPENEM <=0.25 SENSITIVE Sensitive     NITROFURANTOIN 64 INTERMEDIATE Intermediate     TRIMETH/SULFA <=20 SENSITIVE Sensitive     AMPICILLIN/SULBACTAM 4 SENSITIVE Sensitive     PIP/TAZO <=4 SENSITIVE Sensitive     Extended ESBL NEGATIVE Sensitive     * >=100,000 COLONIES/mL KLEBSIELLA PNEUMONIAE  MRSA PCR Screening     Status: Abnormal   Collection Time: 02/24/18  3:51 PM  Result Value Ref Range Status   MRSA by PCR POSITIVE (A) NEGATIVE Final    Comment:  The GeneXpert MRSA Assay (FDA approved for NASAL specimens only), is one component of a comprehensive MRSA colonization surveillance program. It is not intended to diagnose MRSA infection nor to guide or monitor treatment for MRSA infections. RESULT CALLED TO, READ BACK BY AND VERIFIED WITH: [name] 02/24/18 @ 1719  MLK Performed at [facility], [address] [place], [place] [address]     Coagulation Studies: No results for input(s): LABPROT, INR in the last 72 hours.  Urinalysis: Recent Labs    03/30/18 1014  COLORURINE AMBER*  LABSPEC 1.019  PHURINE 6.0  GLUCOSEU NEGATIVE  HGBUR MODERATE*  BILIRUBINUR NEGATIVE  KETONESUR NEGATIVE  PROTEINUR >=300*  NITRITE NEGATIVE  LEUKOCYTESUR MODERATE*       Imaging: Ct Head Wo Contrast  Result Date: 03/30/2018 CLINICAL DATA:  Altered level of consciousness, unexplained EXAM: CT HEAD WITHOUT CONTRAST TECHNIQUE: Contiguous axial images were obtained from the base of the skull through the vertex without intravenous contrast. COMPARISON:  11/24/2017 FINDINGS: Brain: No evidence of acute infarction, hemorrhage, hydrocephalus, extra-axial collection or mass lesion/mass effect. Age advanced volume loss with the cerebellum appears more affected than the cerebrum Vascular: Extensive atherosclerotic calcification Skull: No acute finding Sinuses/Orbits: Bilateral cataract resection. IMPRESSION: 1. No acute finding or change from prior. 2. Age advanced atrophy. Electronically Signed   By: Marnee Spring M.D.   On: 03/30/2018 08:57   Dg Chest Portable 1 View  Result Date: 03/30/2018 CLINICAL DATA:  Altered mental status. EXAM: PORTABLE CHEST 1 VIEW COMPARISON:  02/24/2018. FINDINGS: Cardiomegaly with mild interstitial prominence and small right pleural effusion consistent with CHF. Findings are less prominent than noted on prior chest x-ray of 02/24/2018. No pneumothorax. IMPRESSION: Findings suggesting mild congestive heart failure with pulmonary interstitial edema and right pleural effusion. Findings are less prominent than noted on prior chest x-ray of 02/24/2018. Electronically Signed   By: Maisie Fus  Register   On: 03/30/2018 09:24     Medications:    . ARIPiprazole  2.5 mg Oral BID  . aspirin  300 mg Rectal Daily  . benztropine  0.5 mg Oral BID  . budesonide  0.5 mg Nebulization BID  . carboxymethylcellul-glycerin  1 drop Both Eyes TID  . Carboxymethylcellulose Sodium  1 drop Ophthalmic Daily  . clopidogrel  75 mg Oral Q1200  . escitalopram  5 mg Oral Q2000  . fluticasone  1 spray Each Nare Q1200  . heparin  5,000 Units Subcutaneous Q8H  . isosorbide mononitrate  90 mg Oral Q1200  . lactulose  10 g Oral q1800  . levETIRAcetam  1,500 mg Oral  Q1200  . lidocaine  1 patch Transdermal QHS  . lidocaine  1 application Topical Q M,W,F-HD  . Melatonin  2.5 mg Oral Q2000  . OXcarbazepine  150 mg Oral QHS  . pantoprazole  40 mg Oral BID  . Tiotropium Bromide Monohydrate  1 puff Inhalation Q1200   acetaminophen, acetaminophen, albuterol, bisacodyl, nitroGLYCERIN, ondansetron **OR** ondansetron (ZOFRAN) IV  Assessment/ Plan:  Kimberly Montgomery is a 52 y.o. white female with end stage renal disease on hemodialysis, COPD, diabetes mellitus type II, hypertension, congestive heart failure, coronary artery disease, CVA, seizure disorder, depression/bipolar disorder.   [facility] [address] Right AVF 106kg.   1. End Stage Renal Disease: last hemodialysis treatment was Friday. Hemodialysis for later today. Orders prepared. 2K bath With acute respiratory failure requiring BIPAP  2. Hypertension: bradycardia on examination. Outpatient regimen of metoprolol, lisinopril, torsemide, amlodipine, hydralazine and isosorbide  mononitrate.   3. Anemia of chronic kidney disease: hemoglobin 12.3 - no indication for EPO.   4. Secondary Hyperparathyroidism:  - sevelamer with meals.   5. Altered mental status: similar presentation in November 2019. Was secondary to urinary tract infection. UA consistent with infection.  - broad spectrum antibiotics.  - BIPAP due to hypercapnea.    LOS: 0 Kimberly Montgomery 12/30/201911:29 AM

## 2018-03-30 NOTE — ED Provider Notes (Signed)
[facility] Emergency Department Provider Note    First MD Initiated Contact with Patient 03/30/18 [phone]     (approximate)  I have reviewed the triage vital signs and the nursing notes.   HISTORY  Chief Complaint Altered Mental Status  Level V Caveat:  AMS  HPI Kimberly Montgomery is a 52 y.o. female presents the ER for altered mental status.  Unable to provide any additional history.  Per EMS and staff the patient was found crawling on the floor without her oxygen.  Is due for dialysis today.  Last seen normal was sometime yesterday but staff is not sure.    Past Medical History:  Diagnosis Date  . Anginal pain (HCC)   . Anxiety   . Bipolar disorder (HCC)   . CAD (coronary artery disease)   . CHF (congestive heart failure) (HCC)   . Chronic lower back pain   . Depression   . Endometriosis   . ESRD (end stage renal disease) on dialysis (HCC)    "[facility]; [address]; [place]; [place]" (01/19/2016)  . Gastroparesis   . GERD (gastroesophageal reflux disease)   . Heart attack (HCC) 08/2017   stent placed, [facility]  . History of blood transfusion "several"   "my blood would get low; low RBC"  . History of hiatal hernia   . HLD (hyperlipidemia)   . Hypertension   . Migraine    "monthly" (01/19/2016)  . Myocardial infarction (HCC) 2017   "~ 3 wks ago" (01/19/2016)  . Renal disorder   . Renal insufficiency   . Seizures (HCC) 07/2015   "I've only had the 1; don't know what from" (01/19/2016)  . Stroke (HCC)   . Type II diabetes mellitus (HCC)    Family History  Problem Relation Age of Onset  . CAD Other   . Diabetes Other   . Bipolar disorder Other   . Cervical cancer Mother   . Other Father        heart bypass  . Breast cancer Neg Hx    Past Surgical History:  Procedure Laterality Date  . A/V FISTULAGRAM Left 11/27/2017   Procedure: A/V FISTULAGRAM;  Surgeon: Annice Needy, MD;  Location: [facility] INVASIVE CV LAB;  Service:  Cardiovascular;  Laterality: Left;  . ABDOMINAL HYSTERECTOMY     "partial"  . AV FISTULA PLACEMENT Right 08/23/2016   Procedure: ARTERIOVENOUS (AV) FISTULA CREATION  ( BRACHIAL CEPHALIC );  Surgeon: Renford Dills, MD;  Location: [facility] ORS;  Service: Vascular;  Laterality: Right;  . BELOW KNEE LEG AMPUTATION Right 2010?  [name] CARDIAC CATHETERIZATION Right 07/10/2015   Procedure: Left Heart Cath and Coronary Angiography;  Surgeon: Laurier Nancy, MD;  Location: [facility] INVASIVE CV LAB;  Service: Cardiovascular;  Laterality: Right;  . CARDIAC CATHETERIZATION Right 09/14/2015   Procedure: Left Heart Cath and Coronary Angiography;  Surgeon: Laurier Nancy, MD;  Location: [facility] INVASIVE CV LAB;  Service: Cardiovascular;  Laterality: Right;  . CARDIAC CATHETERIZATION N/A 09/14/2015   Procedure: Coronary Stent Intervention;  Surgeon: Alwyn Pea, MD;  Location: [facility] INVASIVE CV LAB;  Service: Cardiovascular;  Laterality: N/A;  . CARDIAC CATHETERIZATION Right 11/20/2015   Procedure: Left Heart Cath and Coronary Angiography;  Surgeon: Laurier Nancy, MD;  Location: [facility] INVASIVE CV LAB;  Service: Cardiovascular;  Laterality: Right;  . CATARACT EXTRACTION, BILATERAL    . CORONARY ANGIOPLASTY WITH STENT PLACEMENT  <2017   @ [facility]/notes 07/03/2013  . CORONARY ANGIOPLASTY WITH STENT PLACEMENT    .  CORONARY STENT INTERVENTION N/A 08/28/2017   Procedure: CORONARY STENT INTERVENTION;  Surgeon: Alwyn Pea, MD;  Location: [facility] INVASIVE CV LAB;  Service: Cardiovascular;  Laterality: N/A;  . DIALYSIS/PERMA CATHETER REMOVAL N/A 11/12/2016   Procedure: DIALYSIS/PERMA CATHETER REMOVAL;  Surgeon: Renford Dills, MD;  Location: [facility] INVASIVE CV LAB;  Service: Cardiovascular;  Laterality: N/A;  . HYSTEROTOMY    . PERIPHERAL VASCULAR CATHETERIZATION N/A 08/30/2015   Procedure: Dialysis/Perma Catheter Insertion;  Surgeon: Annice Needy, MD;  Location: [facility] INVASIVE CV LAB;  Service: Cardiovascular;  Laterality: N/A;  .  PERIPHERAL VASCULAR CATHETERIZATION N/A 01/01/2016   Procedure: Dialysis/Perma Catheter Insertion;  Surgeon: Annice Needy, MD;  Location: [facility] INVASIVE CV LAB;  Service: Cardiovascular;  Laterality: N/A;  . PERITONEAL CATHETER INSERTION  11/03/2013   [name] 11/03/2013  . PERITONEAL CATHETER REMOVAL  01/01/2016   "took the one from May out; put new PD cath in" (01/19/2016)  . PERITONEAL CATHETER REMOVAL  11/03/2013; 02/11/2014   [name] 11/03/2013; Removal of tunneled catheter/notes 02/11/2014  . RIGHT/LEFT HEART CATH AND CORONARY ANGIOGRAPHY N/A 08/28/2017   Procedure: RIGHT/LEFT HEART CATH AND CORONARY ANGIOGRAPHY;  Surgeon: Laurier Nancy, MD;  Location: [facility] INVASIVE CV LAB;  Service: Cardiovascular;  Laterality: N/A;  . SALPINGOOPHORECTOMY Right    [name] 07/03/2013  . TEE WITHOUT CARDIOVERSION N/A 06/07/2016   Procedure: TRANSESOPHAGEAL ECHOCARDIOGRAM (TEE);  Surgeon: Chrystie Nose, MD;  Location: [facility] ENDOSCOPY;  Service: Cardiovascular;  Laterality: N/A;  . TUBAL LIGATION     Patient Active Problem List   Diagnosis Date Noted  . Acute respiratory failure with hypercapnia (HCC) 03/30/2018  . Hyperkalemia 07/01/2017  . Pneumonia 05/15/2017  . Benign essential tremor 01/30/2017  . Seizure (HCC)   . Acute encephalopathy   . Tremor   . Cerebrovascular disease   . Complication from renal dialysis device 04/08/2016  . Colitis 03/13/2016  . Chronic diastolic congestive heart failure (HCC) 01/22/2016  . Pressure injury of skin 01/20/2016  . Bacteremia, coagulase-negative staphylococcal 12/27/2015  . Essential hypertension 11/21/2015  . Anemia of chronic disease 11/21/2015  . Acute respiratory failure with hypoxia (HCC) 11/21/2015  . ESRD on dialysis (HCC) 11/21/2015  . 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 NSTEMI (non-ST elevated myocardial infarction) (HCC) 11/20/2015  . Chest pain, rule out acute myocardial infarction 11/18/2015  . Bipolar I disorder, most recent episode depressed (HCC)   . Altered  mental status 08/24/2015  . Ileus (HCC)   . Bipolar I disorder (HCC) 07/25/2015  . Seizures (HCC) 07/25/2015  . Peritonitis (HCC) 07/16/2015  . Unstable angina (HCC) 07/09/2015  . Accelerated hypertension 07/09/2015  . Type 2 diabetes mellitus (HCC) 07/09/2015  . CAD (coronary artery disease) 07/09/2015  . HLD (hyperlipidemia) 07/09/2015  . GERD (gastroesophageal reflux disease) 07/09/2015      Prior to Admission medications   Medication Sig Start Date End Date Taking? Authorizing Provider  amLODipine (NORVASC) 10 MG tablet Take 10 mg by mouth daily. (0500)   Yes [provider]  ARIPiprazole (ABILIFY) 5 MG tablet Take 2.5 mg by mouth 2 (two) times daily. (0500 & 1800)   Yes [provider]  aspirin 81 MG chewable tablet Chew 81 mg by mouth daily. (0500)   Yes [provider]  atorvastatin (LIPITOR) 40 MG tablet Take 40 mg by mouth daily at 6 PM.   Yes [provider]  benztropine (COGENTIN) 0.5 MG tablet Take 0.5 mg by mouth 2 (two) times daily. (1300 & 1800)   Yes [provider]  budesonide (PULMICORT) 0.5 MG/2ML nebulizer solution Take 2 mLs (0.5 mg total) by nebulization 2 (two) times daily. Patient taking differently: Take 0.5 mg by nebulization 2 (two) times daily. (1200 & 2000) 08/30/17  Yes Auburn Bilberry, MD  carboxymethylcellul-glycerin (OPTIVE) 0.5-0.9 % ophthalmic solution Apply 1 drop to eye 3 (three) times daily. (0500, 1300, & 1700)   Yes [provider]  clonazePAM (KLONOPIN) 0.5 MG tablet Take 0.25-0.5 mg by mouth See admin instructions. (0500 & 2000) Take  tablet (0.25MG ) by mouth every morning at 0500 & 1 tablet (0.5MG ) by mouth every evening at 2000.   Yes [provider]  clopidogrel (PLAVIX) 75 MG tablet Take 75 mg by mouth daily at 12 noon. (1200)   Yes [provider]  escitalopram (LEXAPRO) 5 MG tablet Take 5 mg by mouth daily at 8 pm. (2100)   Yes [provider]  fluticasone  (FLONASE) 50 MCG/ACT nasal spray Place 1 spray into both nostrils daily at 12 noon. (1200)   Yes [provider]  gabapentin (NEURONTIN) 300 MG capsule Take 300 mg by mouth daily at 12 noon. (1200)   Yes [provider]  hydrALAZINE (APRESOLINE) 50 MG tablet Take 50 mg by mouth See admin instructions. (0900, 1300, & 1700)Take 1 tablet (50MG ) by mouth three times daily (Sunday, Tuesday, Thursday & Saturday)  (1300 & 1800) 1 tablet (50MG ) by mouth twice daily (Monday, Wednesday & Friday).   Yes [provider]  insulin aspart (NOVOLOG) 100 UNIT/ML injection Inject 0-9 Units into the skin every 4 (four) hours. Patient taking differently: Inject 0-9 Units into the skin 4 (four) times daily. (0500, 1200, 1700, & 2000) IF BLOOD SUGAR IS LESS THAN 60, call MD. 200-250=2 units, 251-300=4 units, 301-350=6 units, 351-400=8 units, 401-450=9 units, if blood sugar greater than 450, call MD. 08/30/17  Yes Auburn Bilberry, MD  isosorbide mononitrate (IMDUR) 30 MG 24 hr tablet Take 90 mg by mouth daily at 12 noon. (1200)   Yes [provider]  lactulose (CHRONULAC) 10 GM/15ML solution Take 10 g by mouth daily at 6 PM.    Yes [provider]  levETIRAcetam (KEPPRA) 750 MG tablet Take 2 tablets (1,500 mg total) by mouth daily at 12 noon. Patient taking differently: Take 1,500 mg by mouth daily at 12 noon. (1200) 05/20/17  Yes Delfino Lovett, MD  lidocaine (LIDODERM) 5 % Place 1 patch onto the skin at bedtime. (2000)   Yes [provider]  lidocaine (LMX) 4 % cream Apply 1 application topically every Monday, Wednesday, and Friday with hemodialysis. (0400) To right arm fistula   Yes [provider]  lisinopril (PRINIVIL,ZESTRIL) 10 MG tablet Take 10 mg by mouth daily at 12 noon. (1200)   Yes [provider]  Melatonin 3 MG TABS Take 3 mg by mouth at bedtime. (2000)   Yes [provider]  metoprolol (LOPRESSOR) 50 MG tablet Take 1 tablet (50 mg  total) by mouth 2 (two) times daily. Patient taking differently: Take 50 mg by mouth 2 (two) times daily. (0500 & 1800) 09/15/15  Yes Milagros Loll, MD  Multiple Vitamin (MULTIVITAMIN WITH MINERALS) TABS tablet Take 1 tablet by mouth daily. (0500)   Yes [provider]  omeprazole (PRILOSEC) 20 MG capsule Take 20 mg by mouth 2 (two) times daily. (0530 & 1600)   Yes [provider]  OXcarbazepine (TRILEPTAL) 150 MG tablet Take 150 mg by mouth at bedtime. (2000)   Yes [provider]  senna (SENOKOT) 8.6 MG TABS tablet Take 2 tablets by mouth 2 (two) times daily. (1300 & 1800)   Yes [provider]  sevelamer carbonate (RENVELA) 800 MG tablet Take 800 mg by mouth 4 (four) times daily -  before meals and at bedtime. (0530, 1230, 1700, & 2000)   Yes [provider]  Tiotropium Bromide Monohydrate (SPIRIVA RESPIMAT) 2.5 MCG/ACT AERS Inhale 1 puff into the lungs daily at 12 noon. (1200)   Yes [provider]  torsemide (DEMADEX) 100 MG tablet Take 100 mg by mouth daily at 12 noon. (1200)   Yes [provider]  vitamin C (ASCORBIC ACID) 250 MG tablet Take 500 mg by mouth 2 (two) times daily. (0500 & 1800)   Yes [provider]  acetaminophen (TYLENOL) 500 MG tablet Take 500 mg by mouth every 6 (six) hours as needed for moderate pain or fever.    [provider]  albuterol (PROVENTIL) (2.5 MG/3ML) 0.083% nebulizer solution Take 2.5 mg by nebulization every 4 (four) hours as needed for wheezing or shortness of breath.    [provider]  bisacodyl (DULCOLAX) 10 MG suppository Place 10 mg rectally as needed for moderate constipation.    [provider]  Carboxymethylcellulose Sodium (REFRESH LIQUIGEL) 1 % GEL Apply 1 drop to eye daily.    [provider]  HYDROcodone-acetaminophen (NORCO/VICODIN) 5-325 MG tablet Take 1 tablet by mouth every 6 (six) hours as needed for moderate pain or severe pain. 02/26/18    Houston Siren, MD  LORazepam (ATIVAN) 0.5 MG tablet Take 0.5 mg by mouth every 6 (six) hours as needed for anxiety.    [provider]  nitroGLYCERIN (NITROSTAT) 0.4 MG SL tablet Place 0.4 mg under the tongue every 5 (five) minutes x 3 doses as needed for chest pain.     [provider]  ondansetron (ZOFRAN ODT) 4 MG disintegrating tablet Take 1 tablet (4 mg total) by mouth every 6 (six) hours as needed for nausea or vomiting. 07/16/15   Sharyn Creamer, MD    Allergies Cephalosporins; Penicillins; Reglan [metoclopramide]; Risperdal [risperidone]; Lamictal [lamotrigine]; Phenergan [promethazine hcl]; Sulfamethoxazole-trimethoprim; Sulfasalazine; Pravastatin; and Sulfa antibiotics    Social History Social History   Tobacco Use  . Smoking status: Former Smoker    Packs/day: 0.75    Types: Cigarettes    Last attempt to quit: 07/01/2015    Years since quitting: 2.7  . Smokeless tobacco: Never Used  Substance Use Topics  . Alcohol use: No    Alcohol/week: 0.0 standard drinks  . Drug use: No    Review of Systems Patient denies headaches, rhinorrhea, blurry vision, numbness, shortness of breath, chest pain, edema, cough, abdominal pain, nausea, vomiting, diarrhea, dysuria, fevers, rashes or hallucinations unless otherwise stated above in HPI. ____________________________________________   PHYSICAL EXAM:  VITAL SIGNS: Vitals:   03/30/18 1244 03/30/18 1300  BP: 93/85 (!) 111/46  Pulse: (!) 56 (!) 56  Resp: 15 19  Temp:    SpO2: 92% 96%    Constitutional: Alert but ill appearing and encephalopathic, unable to answer simple questions or follow commands Eyes: Conjunctivae are normal.  Head: Atraumatic. Nose: No congestion/rhinnorhea. Mouth/Throat: Mucous membranes are moist.   Neck: No stridor. Painless ROM.  Cardiovascular: Normal rate, regular rhythm. Grossly normal heart sounds.  Good peripheral circulation. Respiratory: Normal respiratory effort.  No  retractions. Lungs coarse bibasilar breathsounds. Gastrointestinal: Soft and nontender. + pitting edema. No distention. No abdominal bruits. No CVA tenderness. Genitourinary:  Musculoskeletal:  s/p right bka.  No joint effusions. Neurologic:   Unable to cooperate with neuro exam. No gaze deviation, EOMI. Pupils 3mm an reactive.  No facial droop.  Skin:  Skin is warm, dry and intact. No rash noted. Psychiatric:unable to assess  ____________________________________________   LABS (all labs ordered are listed, but only abnormal results are displayed)  Results for orders placed or performed during the hospital encounter of 03/30/18 (from the past 24 hour(s))  Comprehensive metabolic panel     Status: Abnormal   Collection Time: 03/30/18  8:28 AM  Result Value Ref Range   Sodium 137 135 - 145 mmol/L   Potassium 4.8 3.5 - 5.1 mmol/L   Chloride 101 98 - 111 mmol/L   CO2 24 22 - 32 mmol/L   Glucose, Bld 110 (H) 70 - 99 mg/dL   BUN 29 (H) 6 - 20 mg/dL   Creatinine, Ser 1.61 (H) 0.44 - 1.00 mg/dL   Calcium 8.1 (L) 8.9 - 10.3 mg/dL   Total Protein 6.6 6.5 - 8.1 g/dL   Albumin 3.3 (L) 3.5 - 5.0 g/dL   AST 21 15 - 41 U/L   ALT 6 0 - 44 U/L   Alkaline Phosphatase 142 (H) 38 - 126 U/L   Total Bilirubin 0.8 0.3 - 1.2 mg/dL   GFR calc non Af Amer 8 (L) >60 mL/min   GFR calc Af Amer 9 (L) >60 mL/min   Anion gap 12 5 - 15  CBC     Status: Abnormal   Collection Time: 03/30/18  8:28 AM  Result Value Ref Range   WBC 7.3 4.0 - 10.5 K/uL   RBC 4.33 3.87 - 5.11 MIL/uL   Hemoglobin 12.3 12.0 - 15.0 g/dL   HCT 09.6 04.5 - 40.9 %   MCV 97.9 80.0 - 100.0 fL   MCH 28.4 26.0 - 34.0 pg   MCHC 29.0 (L) 30.0 - 36.0 g/dL   RDW 81.1 (H) 91.4 - 78.2 %   Platelets 183 150 - 400 K/uL   nRBC 0.0 0.0 - 0.2 %  Blood gas, venous     Status: Abnormal   Collection Time: 03/30/18  9:58 AM  Result Value Ref Range   FIO2 0.21    Delivery systems ROOM AIR    pH, Ven 7.25 7.250 - 7.430   pCO2, Ven 71 (HH) 44.0 -  60.0 mmHg   pO2, Ven 62.0 (H) 32.0 - 45.0 mmHg   Bicarbonate 31.1 (H) 20.0 - 28.0 mmol/L   Acid-Base Excess 1.8 0.0 - 2.0 mmol/L   O2 Saturation 87.1 %   Patient temperature 37.0    Collection site VENOUS    Sample type VENOUS   Ammonia     Status: None   Collection Time: 03/30/18  9:58 AM  Result Value Ref Range   Ammonia 28 9 - 35 umol/L  Urinalysis, Complete w Microscopic     Status: Abnormal   Collection Time: 03/30/18 10:14 AM  Result Value Ref Range   Color, Urine AMBER (A) YELLOW   APPearance TURBID (A) CLEAR   Specific Gravity, Urine 1.019 1.005 - 1.030   pH 6.0 5.0 - 8.0   Glucose, UA NEGATIVE NEGATIVE mg/dL   Hgb urine dipstick MODERATE (A) NEGATIVE   Bilirubin Urine NEGATIVE NEGATIVE   Ketones, ur NEGATIVE NEGATIVE mg/dL   Protein, ur >=956 (A) NEGATIVE mg/dL   Nitrite NEGATIVE NEGATIVE   Leukocytes, UA MODERATE (A) NEGATIVE   RBC / HPF >50 (H) 0 - 5  RBC/hpf   WBC, UA >50 (H) 0 - 5 WBC/hpf   Bacteria, UA MANY (A) NONE SEEN   Squamous Epithelial / LPF 6-10 0 - 5   WBC Clumps PRESENT   Urine Drug Screen, Qualitative ([facility] only)     Status: None   Collection Time: 03/30/18 10:14 AM  Result Value Ref Range   Tricyclic, Ur Screen NONE DETECTED NONE DETECTED   Amphetamines, Ur Screen NONE DETECTED NONE DETECTED   MDMA (Ecstasy)Ur Screen NONE DETECTED NONE DETECTED   Cocaine Metabolite,Ur [place] NONE DETECTED NONE DETECTED   Opiate, Ur Screen NONE DETECTED NONE DETECTED   Phencyclidine (PCP) Ur S NONE DETECTED NONE DETECTED   Cannabinoid 50 Ng, Ur [place] NONE DETECTED NONE DETECTED   Barbiturates, Ur Screen NONE DETECTED NONE DETECTED   Benzodiazepine, Ur Scrn NONE DETECTED NONE DETECTED   Methadone Scn, Ur NONE DETECTED NONE DETECTED  MRSA PCR Screening     Status: Abnormal   Collection Time: 03/30/18 11:47 AM  Result Value Ref Range   MRSA by PCR POSITIVE (A) NEGATIVE   ____________________________________________  EKG My review and personal interpretation at Time:  13:16   Indication: ams  Rate: 60  Rhythm: sinus Axis: normal Other: normal intervals, no stemi ____________________________________________  RADIOLOGY  I personally reviewed all radiographic images ordered to evaluate for the above acute complaints and reviewed radiology reports and findings.  These findings were personally discussed with the patient.  Please see medical record for radiology report.  ____________________________________________   PROCEDURES  Procedure(s) performed:  .Critical Care Performed by: Willy Eddyobinson, Doranne Schmutz, MD Authorized by: Willy Eddyobinson, Cheryllynn Sarff, MD   Critical care provider statement:    Critical care time (minutes):  35   Critical care time was exclusive of:  Separately billable procedures and treating other patients   Critical care was necessary to treat or prevent imminent or life-threatening deterioration of the following conditions:  Renal failure, respiratory failure and toxidrome   Critical care was time spent personally by me on the following activities:  Development of treatment plan with patient or surrogate, discussions with consultants, evaluation of patient's response to treatment, examination of patient, obtaining history from patient or surrogate, ordering and performing treatments and interventions, ordering and review of laboratory studies, ordering and review of radiographic studies, pulse oximetry, re-evaluation of patient's condition and review of old charts      Critical Care performed: yes ____________________________________________   INITIAL IMPRESSION / ASSESSMENT AND PLAN / ED COURSE  Pertinent labs & imaging results that were available during my care of the patient were reviewed by me and considered in my medical decision making (see chart for details).   DDX: Dehydration, sepsis, pna, uti, hypoglycemia, cva, drug effect, withdrawal, encephalitis   Theron AristaLaura L Lowery is a 52 y.o. who presents to the ED with altered mental status as  described above.  No reported trauma.  Blood will be sent for the above differential.  Patient with multiple comorbidities with extensive past medical history.  He is currently requiring supplemental oxygen.  Possible pneumonia versus edema.  She is due for dialysis today.  Clinical Course as of Mar 31 1419  Mon Mar 30, 2018  0957 Patient still encephalopathic requiring supplemental oxygen.  Doubt infectious process.  Does have volume overload which would be causing patient's hypoxia.  Patient will require hospitalization for encephalopathy that I do have a high suspicion for polypharmacy.  I have consulted nephrology as the patient will require dialysis today.   [PR]  1016 Patient  with hypercapnia with compensation given her mental status changes will place on BiPAP.  She is currently protecting her airway.   [PR]    Clinical Course User Index [PR] Willy Eddy, MD     As part of my medical decision making, I reviewed the following data within the electronic MEDICAL RECORD NUMBER Nursing notes reviewed and incorporated, Labs reviewed, notes from prior ED visits.  ____________________________________________   FINAL CLINICAL IMPRESSION(S) / ED DIAGNOSES  Final diagnoses:  Acute pulmonary edema (HCC)  Acute encephalopathy  Acute respiratory failure with hypoxia and hypercapnia (HCC)      NEW MEDICATIONS STARTED DURING THIS VISIT:  Current Discharge Medication List       Note:  This document was prepared using Dragon voice recognition software and may include unintentional dictation errors.    Willy Eddy, MD 03/30/18 1420

## 2018-03-30 NOTE — ED Notes (Signed)
Noted pt's O2 sat 55% on 2L. Pt breathing through mouth. Pt stimulated and O2 changed to 4L. O2 increased to 98%. Pt's primary RN notified.

## 2018-03-30 NOTE — Progress Notes (Signed)
Spoke with Irving Burton, CMA, to make MD aware that pt is scheduled for an MRI with anesthesia tomorrow at [facility] but is currently admitted to [facility]. Irving Burton stated that MD said to " cancel. "

## 2018-03-31 ENCOUNTER — Encounter (HOSPITAL_COMMUNITY): Payer: Self-pay

## 2018-03-31 ENCOUNTER — Ambulatory Visit (HOSPITAL_COMMUNITY): Payer: Medicare HMO

## 2018-03-31 ENCOUNTER — Inpatient Hospital Stay: Payer: Medicare HMO

## 2018-03-31 ENCOUNTER — Ambulatory Visit (HOSPITAL_COMMUNITY): Admission: RE | Admit: 2018-03-31 | Payer: Medicare HMO | Source: Home / Self Care

## 2018-03-31 LAB — BASIC METABOLIC PANEL
Anion gap: 10 (ref 5–15)
BUN: 16 mg/dL (ref 6–20)
CO2: 30 mmol/L (ref 22–32)
Calcium: 8.2 mg/dL — ABNORMAL LOW (ref 8.9–10.3)
Chloride: 98 mmol/L (ref 98–111)
Creatinine, Ser: 3.71 mg/dL — ABNORMAL HIGH (ref 0.44–1.00)
GFR calc Af Amer: 15 mL/min — ABNORMAL LOW (ref >60)
GFR calc non Af Amer: 13 mL/min — ABNORMAL LOW (ref >60)
GLUCOSE: 73 mg/dL (ref 70–99)
Potassium: 3.1 mmol/L — ABNORMAL LOW (ref 3.5–5.1)
Sodium: 138 mmol/L (ref 135–145)

## 2018-03-31 LAB — GLUCOSE, CAPILLARY
GLUCOSE-CAPILLARY: 99 mg/dL (ref 70–99)
Glucose-Capillary: 66 mg/dL — ABNORMAL LOW (ref 70–99)
Glucose-Capillary: 79 mg/dL (ref 70–99)
Glucose-Capillary: 113 mg/dL — ABNORMAL HIGH (ref 70–99)
Glucose-Capillary: 114 mg/dL — ABNORMAL HIGH (ref 70–99)

## 2018-03-31 LAB — CBC
HCT: 38.4 % (ref 36.0–46.0)
Hemoglobin: 11.3 g/dL — ABNORMAL LOW (ref 12.0–15.0)
MCH: 27.9 pg (ref 26.0–34.0)
MCHC: 29.4 g/dL — ABNORMAL LOW (ref 30.0–36.0)
MCV: 94.8 fL (ref 80.0–100.0)
Platelets: 152 10*3/uL (ref 150–400)
RBC: 4.05 MIL/uL (ref 3.87–5.11)
RDW: 16.6 % — ABNORMAL HIGH (ref 11.5–15.5)
WBC: 5 10*3/uL (ref 4.0–10.5)
nRBC: 0 % (ref 0.0–0.2)

## 2018-03-31 SURGERY — MRI WITH ANESTHESIA
Anesthesia: General

## 2018-03-31 MED ORDER — DEXTROSE 50 % IV SOLN
INTRAVENOUS | Status: AC
  Filled 2018-03-31: qty 50

## 2018-03-31 MED ORDER — DEXTROSE-NACL 5-0.45 % IV SOLN
INTRAVENOUS | Status: DC
  Administered 2018-03-31: 11:00:00 via INTRAVENOUS

## 2018-03-31 MED ORDER — MUPIROCIN 2 % EX OINT
1.0000 "application " | TOPICAL_OINTMENT | Freq: Two times a day (BID) | CUTANEOUS | Status: AC
  Administered 2018-03-31 – 2018-04-04 (×9): 1 via NASAL
  Filled 2018-03-31: qty 22

## 2018-03-31 MED ORDER — SODIUM CHLORIDE 0.9 % IV SOLN
500.0000 mg | Freq: Every day | INTRAVENOUS | Status: DC
  Administered 2018-03-31 – 2018-04-03 (×4): 500 mg via INTRAVENOUS
  Filled 2018-03-31: qty 0.5
  Filled 2018-03-31 (×3): qty 500
  Filled 2018-03-31: qty 0.5

## 2018-03-31 MED ORDER — CHLORHEXIDINE GLUCONATE CLOTH 2 % EX PADS
6.0000 | MEDICATED_PAD | Freq: Every day | CUTANEOUS | Status: AC
  Administered 2018-04-02 – 2018-04-04 (×3): 6 via TOPICAL

## 2018-03-31 MED ORDER — DEXTROSE 50 % IV SOLN
12.5000 g | Freq: Once | INTRAVENOUS | Status: AC
  Administered 2018-03-31: 12.5 g via INTRAVENOUS

## 2018-03-31 MED ORDER — BUDESONIDE 0.5 MG/2ML IN SUSP
0.5000 mg | Freq: Two times a day (BID) | RESPIRATORY_TRACT | Status: DC
  Administered 2018-04-01 – 2018-04-06 (×11): 0.5 mg via RESPIRATORY_TRACT
  Filled 2018-03-31 (×13): qty 2

## 2018-03-31 NOTE — Progress Notes (Signed)
Pharmacy Antibiotic Note  Kimberly Montgomery is a 52 y.o. female admitted on 03/30/2018 with AMS. Patient has had similar presentation in the past with AMS secondary to UTI. Patient received regularly scheduled dialysis Monday/Wednesday/Friday and last received dialysis on 12/30. Pharmacy has been consulted for meropenem dosing.  Plan: Will initiate meropenem 500mg  IV Daily -1800. Patient had tolerated meropenem on previous admission.   Height: 5\' 9"  (175.3 cm) Weight: 232 lb 5.8 oz (105.4 kg) IBW/kg (Calculated) : 66.2  Temp (24hrs), Avg:98.4 F (36.9 C), Min:97.9 F (36.6 C), Max:98.9 F (37.2 C)  Recent Labs  Lab 03/30/18 0828 03/31/18 0335  WBC 7.3 5.0  CREATININE 5.57* 3.71*    Estimated Creatinine Clearance: 22.4 mL/min (A) (by C-G formula based on SCr of 3.71 mg/dL (H)).    Allergies  Allergen Reactions  . Cephalosporins Anaphylaxis    Patient has tolerated meropenem.   [name] Penicillins Anaphylaxis and Other (See Comments)    Has patient had a PCN reaction causing immediate rash, facial/tongue/throat swelling, SOB or lightheadedness with hypotension: Yes Has patient had a PCN reaction causing severe rash involving mucus membranes or skin necrosis: No Has patient had a PCN reaction that required hospitalization No Has patient had a PCN reaction occurring within the last 10 years: No If all of the above answers are "NO", then may proceed with Cephalosporin use.  . Reglan [Metoclopramide] Other (See Comments)    Severe EPS (lip, head, body tremors) March 2018  . Risperdal [Risperidone] Other (See Comments)    Severe EPS (lip, head, body tremors) March 2018  . Lamictal [Lamotrigine] Other (See Comments)    Reaction:  Hallucinations  . Phenergan [Promethazine Hcl] Nausea And Vomiting    Sensitivity to medicine  . Sulfamethoxazole-Trimethoprim     Other reaction(s): hyperkalemia  . Sulfasalazine Other (See Comments)    Reaction:  Unknown   . Pravastatin Other (See Comments)     Reaction:  Muscle pain   . Sulfa Antibiotics Other (See Comments)    Reaction:  Unknown     Antimicrobials this admission: Ciprofloxacin 12/30 >> 12/30 Meropenem 12/31 >>   Dose adjustments this admission: NA  Microbiology results: 12/30 UCx: pending  12/30 MRSA PCR: positive   Thank you for allowing pharmacy to be a part of this patient's care.  Simpson,Rochette L 03/31/2018 12:19 PM

## 2018-03-31 NOTE — Progress Notes (Signed)
Follow up - Critical Care Medicine Note  Patient Details:    Kimberly Montgomery is an 52 y.o. female.with a complicated past medical history to include hypertension, hyperlipidemia, coronary artery disease, congestive heart failure, anxiety/depression, CVA, seizures, end-stage renal disease on Monday Wednesday Friday hemodialysis at [facility], [facility] secondary to altered mental status.  Patient was unable to give history in the emergency department mental status was altered, speech was slow and slurred, CT scan of the head did not reveal any acute findings however advanced atrophy was noted, chest x-ray revealed cardiomegaly with pulmonary edema, urinalysis was positive for UTI, arterial blood gas revealed hypercapnic respiratory failure requiring BiPAP, renal labs reveal a potassium of 4.8, BUN 29, creatinine 5.57 with an anion gap of 12.  Normal white count at 7.3 and hemoglobin 12.3.  Patient was seen by nephrology, transferred to the intensive care unit on BiPAP for hemodialysis  Lines, Airways, Drains: External Urinary Catheter (Active)  Collection Container Dedicated Suction Canister 03/31/2018  2:00 AM  Securement Method Other (Comment) 03/31/2018  2:00 AM    Anti-infectives:  Anti-infectives (From admission, onward)   Start     Dose/Rate Route Frequency Ordered Stop   03/31/18 1800  ciprofloxacin (CIPRO) IVPB 400 mg     400 mg 200 mL/hr over 60 Minutes Intravenous Every 24 hours 03/30/18 1155     03/30/18 1145  ciprofloxacin (CIPRO) IVPB 400 mg     400 mg 200 mL/hr over 60 Minutes Intravenous  Once 03/30/18 1139 03/30/18 1456      Microbiology: Results for orders placed or performed during the hospital encounter of 03/30/18  MRSA PCR Screening     Status: Abnormal   Collection Time: 03/30/18 11:47 AM  Result Value Ref Range Status   MRSA by PCR POSITIVE (A) NEGATIVE Final    Comment:        The GeneXpert MRSA Assay (FDA approved for NASAL specimens only), is one  component of a comprehensive MRSA colonization surveillance program. It is not intended to diagnose MRSA infection nor to guide or monitor treatment for MRSA infections. RESULT CALLED TO, READ BACK BY AND VERIFIED WITHBrynda Greathouse:  ALEXI FRASER AT 1317 03/30/18 SDR Performed at [facility], [address] [place], [place] [address]    Studies: Ct Head Wo Contrast  Result Date: 03/30/2018 CLINICAL DATA:  Altered level of consciousness, unexplained EXAM: CT HEAD WITHOUT CONTRAST TECHNIQUE: Contiguous axial images were obtained from the base of the skull through the vertex without intravenous contrast. COMPARISON:  11/24/2017 FINDINGS: Brain: No evidence of acute infarction, hemorrhage, hydrocephalus, extra-axial collection or mass lesion/mass effect. Age advanced volume loss with the cerebellum appears more affected than the cerebrum Vascular: Extensive atherosclerotic calcification Skull: No acute finding Sinuses/Orbits: Bilateral cataract resection. IMPRESSION: 1. No acute finding or change from prior. 2. Age advanced atrophy. Electronically Signed   By: Marnee SpringJonathon  Watts M.D.   On: 03/30/2018 08:57   Dg Chest Portable 1 View  Result Date: 03/30/2018 CLINICAL DATA:  Altered mental status. EXAM: PORTABLE CHEST 1 VIEW COMPARISON:  02/24/2018. FINDINGS: Cardiomegaly with mild interstitial prominence and small right pleural effusion consistent with CHF. Findings are less prominent than noted on prior chest x-ray of 02/24/2018. No pneumothorax. IMPRESSION: Findings suggesting mild congestive heart failure with pulmonary interstitial edema and right pleural effusion. Findings are less prominent than noted on prior chest x-ray of 02/24/2018. Electronically Signed   By: Maisie Fushomas  Register   On: 03/30/2018 09:24    Consults:  Treatment Team:  Pccm, Raymond Gurney, MD   Subjective:    Overnight Issues: Patient received hemodialysis yesterday, was also weaned off of BiPAP.  Presently doing well, awake  and alert in no acute distress  Objective:  Vital signs for last 24 hours: Temp:  [97.2 F (36.2 C)-98.6 F (37 C)] 98.3 F (36.8 C) (12/31 0400) Pulse Rate:  [50-64] 63 (12/31 0600) Resp:  [9-19] 9 (12/31 0600) BP: (91-141)/(39-108) 134/93 (12/31 0600) SpO2:  [55 %-100 %] 100 % (12/31 0600) FiO2 (%):  [28 %] 28 % (12/30 1237) Weight:  [105.4 kg-107.4 kg] 105.4 kg (12/30 2338)  Hemodynamic parameters for last 24 hours:    Intake/Output from previous day: 12/30 0701 - 12/31 0700 In: 310.1 [IV Piggyback:310.1] Out: 3014   Intake/Output this shift: No intake/output data recorded.  Vent settings for last 24 hours: FiO2 (%):  [28 %] 28 %  Physical Exam:  Vital signs:       Please see the above listed vital signs HEENT:           Patient is on [place], trachea midline, no thyromegaly appreciated, no accessory muscle utilization Cardiovascular:            Regular rate and rhythm Abdominal:      Positive bowel sounds, soft exam Pulmonary:      Clear to auscultation Extremities:     No clubbing, cyanosis or edema noted.  Fistula on right arm noted Neurologic:      Patient is awake, alert, responsive and communicating  Assessment/Plan:  Altered mental status.  Significantly improved, has been weaned off of BiPAP with increased mental status.  Status post hemodialysis.  Presently on nasal cannula with stable hemodynamics   end-stage renal disease.    Dialysis schedule per nephrology.  Potassium 3.1, BUN 16, creatinine 3.71, anion gap of 10 with a CO2 of 30  UTI.    On ciprofloxacin  Seizure disorder.  Is on Keppra and Trileptal  Prior history of coronary artery disease and cerebrovascular disease on aspirin and Plavix  Patient is doing well stable for floor transfer  Kimberly Montgomery 03/31/2018  *Care during the described time interval was provided by me and/or other providers on the critical care team.  I have reviewed this patient's available data, including medical history,  events of note, physical examination and test results as part of my evaluation.

## 2018-03-31 NOTE — Care Management (Signed)
Kimberly Montgomery dialysis liaison notified of admission.   [address] [facility]. MWF

## 2018-03-31 NOTE — Evaluation (Signed)
Clinical/Bedside Swallow Evaluation Patient Details  Name: Kimberly Montgomery MRN: [id number] Date of Birth: 06/06/1965  Today's Date: 03/31/2018 Time: SLP Start Time (ACUTE ONLY): 1310 SLP Stop Time (ACUTE ONLY): 1340 SLP Time Calculation (min) (ACUTE ONLY): 30 min  Past Medical History:  Past Medical History:  Diagnosis Date  . Anginal pain (HCC)   . Anxiety   . Bipolar disorder (HCC)   . CAD (coronary artery disease)   . CHF (congestive heart failure) (HCC)   . Chronic lower back pain   . Depression   . Endometriosis   . ESRD (end stage renal disease) on dialysis (HCC)    "[facility]; [address]; [place]; TTS" (01/19/2016)  . Gastroparesis   . GERD (gastroesophageal reflux disease)   . Heart attack (HCC) 08/2017   stent placed, [facility]  . History of blood transfusion "several"   "my blood would get low; low RBC"  . History of hiatal hernia   . HLD (hyperlipidemia)   . Hypertension   . Migraine    "monthly" (01/19/2016)  . Myocardial infarction (HCC) 2017   "~ 3 wks ago" (01/19/2016)  . Renal disorder   . Renal insufficiency   . Seizures (HCC) 07/2015   "I've only had the 1; don't know what from" (01/19/2016)  . Stroke (HCC)   . Type II diabetes mellitus (HCC)    Past Surgical History:  Past Surgical History:  Procedure Laterality Date  . A/V FISTULAGRAM Left 11/27/2017   Procedure: A/V FISTULAGRAM;  Surgeon: Annice Needy, MD;  Location: [facility] INVASIVE CV LAB;  Service: Cardiovascular;  Laterality: Left;  . ABDOMINAL HYSTERECTOMY     "partial"  . AV FISTULA PLACEMENT Right 08/23/2016   Procedure: ARTERIOVENOUS (AV) FISTULA CREATION  ( BRACHIAL CEPHALIC );  Surgeon: Renford Dills, MD;  Location: [facility] ORS;  Service: Vascular;  Laterality: Right;  . BELOW KNEE LEG AMPUTATION Right 2010?  [name] CARDIAC CATHETERIZATION Right 07/10/2015   Procedure: Left Heart Cath and Coronary Angiography;  Surgeon: Laurier , MD;  Location: [facility] INVASIVE CV LAB;  Service:  Cardiovascular;  Laterality: Right;  . CARDIAC CATHETERIZATION Right 09/14/2015   Procedure: Left Heart Cath and Coronary Angiography;  Surgeon: Laurier , MD;  Location: [facility] INV[facility] CV LAB;  Service: Cardiovascular;  Laterality: Right;  . CARDIAC CATHETERIZATION N/A 09/14/2015   Procedure: Coronary Stent Intervention;  Surgeon: Alwyn Pea, MD;  Location: [facility] CV [facility];  Service: Cardiovascular;  Laterality: N/A;  . CARDIAC CATHETERIZATION Right 11/20/2015   Procedure: Left Heart Cath and Coronary Angiography;  Surgeon: Laurier , MD;  Location: [facility] INV[facility] CV LAB;  Service: Cardiovascular;  Laterality: Right;  . CATARACT EXTRACTION, BILATERAL    . CORONARY ANGIOPLASTY WITH STENT PLACEMENT  <2017   @ [facility]/notes 07/03/2013  . CORONARY ANGIOPLASTY WITH STENT PLACEMENT    . CORONARY STENT INTERVENTION N/A 08/28/2017   Procedure: CORONARY STENT INTERVENTION;  Surgeon: Alwyn Pea, MD;  Location: [facility] LAB;  Service: Cardiovascular;  Laterality: N/A;  . DIALYSIS/PERMA CATHETER REMOVAL N/A 11/12/2016   Procedure: DIALYSIS/PERMA CATHETER REMOVAL;  Surgeon: Renford Dills, MD;  Location: [facility];  Service: Cardiovascular;  Laterality: N/A;  . HYSTEROTOMY    . PERIPHERAL VASCULAR CATHETERIZATION N/A 08/30/2015   Procedure: Dialysis/Perma Catheter Insertion;  Surgeon: Annice Needy, MD;  Location: [facility];  Service: Cardiovascular;  Laterality: N/A;  . PERIPHERAL VASCULAR CATHETERIZATION N/A 01/01/2016   Procedure: Dialysis/Perma Catheter Insertion;  Surgeon:  Annice NeedyJason S Dew, MD;  Location: [facility];  Service: Cardiovascular;  Laterality: N/A;  . PERITONEAL CATHETER INSERTION  11/03/2013   [name]/notes 11/03/2013  . PERITONEAL CATHETER REMOVAL  01/01/2016   "took the one from May out; put new PD cath in" (01/19/2016)  . PERITONEAL CATHETER REMOVAL  11/03/2013; 02/11/2014   [name]/notes 11/03/2013; Removal of tunneled catheter/notes 02/11/2014  .  RIGHT/LEFT HEART CATH AND CORONARY ANGIOGRAPHY N/A 08/28/2017   Procedure: RIGHT/LEFT HEART CATH AND CORONARY ANGIOGRAPHY;  Surgeon: Laurier NancyKhan, Shaukat A, MD;  Location: [facility] INV[facility] CV LAB;  Service: Cardiovascular;  Laterality: N/A;  . SALPINGOOPHORECTOMY Right    [name]/notes 07/03/2013  . TEE WITHOUT CARDIOVERSION N/A 06/07/2016   Procedure: TRANSESOPHAGEAL ECHOCARDIOGRAM (TEE);  Surgeon: Chrystie NoseKenneth C Hilty, MD;  [facility];  Service: Cardiovascular;  Laterality: N/A;  . TUBAL LIGATION     HPI:  Patient is a 52 year old female with end-stage renal disease on hemodialysis, seizure disorder and chronic diastolic heart failure with preserved ejection fraction who presented to the ER due to altered mental status. Diagnoses of Acute encephalopathy in the setting of hypercarbia urinary tract infection: Mental status has shown improvement since admission however not quite at baseline. [facility]    Assessment / Plan / Recommendation Clinical Impression  Patient presenting in bed reclined with legs over side of bed and reaching over to railing, potentially attempting to get OOB. SLP assisted patient back into bed with postural modifications. Repositioned nasal cannula properly. Patient demonstrated intermittent confusion, however made appropriate eye contact with SLP, responding appropriately to y/n ques during conversation, and agreed to BSE today. Patient presents with mild oral dysphagia with the following characteristics: decreased facial sensation, decreased lingual ROM, adequate jaw ROM, adequate labial ROM/strength, adequate volitional cough resulting in min anterior oral spillage of thin liquids via cup sips, puffed up cheeks for liquid boluses (indicating potential bolus holding), minimal to no residue with any consistency in oral cavity, no coughing/choking/throat clearing, no changes in vocal quality, and no noted labored breathing. Patient required max feeding assistance and  min verbal cues d/t distractibility. D/t hx of respiratory issues and intermittent confusion, recommended diet includes Dysphagia 3 (Mechanical Soft) and Thin liquids (cup or straw). Aspiration precautions reviewed with patient - visual aid placed in room. Patient requested meds to be in puree. Will require feeding assistance from staff and oral care before/after meals. NSG notified of recommendations. Will continue to address ability to upgrade diet during hospitalization to maximize safe PO intake.  Patient left in bed and call bell within reach.  SLP Visit Diagnosis: Dysphagia, oral phase (R13.11)    Aspiration Risk  Mild aspiration risk    Diet Recommendation Dysphagia 3 (Mech soft);Thin liquid (cup or straw)  Liquid Administration via: Straw;Cup Medication Administration: Crushed with puree Supervision: Full supervision/cueing for compensatory strategies;Staff to assist with self feeding Compensations: Minimize environmental distractions;Slow rate;Small sips/bites;Follow solids with liquid Postural Changes: Seated upright at 90 degrees    Other  Recommendations Oral Care Recommendations: Oral care before and after PO   Follow up Recommendations   TBD     Frequency and Duration min 3x week  2 weeks       Prognosis Prognosis for Safe Diet Advancement: Good Barriers to Reach Goals: Cognitive deficits;Behavior      Swallow Study   General Date of Onset: 03/30/18 HPI: Patient is a 52 year old female with end-stage renal disease on hemodialysis, seizure disorder and chronic diastolic heart failure with preserved ejection fraction  who presented to the ER due to altered mental status. Diagnoses of Acute encephalopathy in the setting of hypercarbia urinary tract infection: Mental status has shown improvement since admission however not quite at baseline. [facility].  Type of Study: Bedside Swallow Evaluation Previous Swallow Assessment: No report of previous  swallowing assessment.  Diet Prior to this Study: NPO Temperature Spikes Noted: No Respiratory Status: Nasal cannula History of Recent Intubation: No Behavior/Cognition: Confused;Lethargic/Drowsy;Distractible;Requires cueing Oral Cavity Assessment: Within Functional Limits Oral Care Completed by SLP: Yes Oral Cavity - Dentition: Missing dentition Self-Feeding Abilities: Total assist Patient Positioning: Upright in bed Baseline Vocal Quality: Normal;Hoarse Volitional Cough: Strong Volitional Swallow: (Unable to assess at bedside)    Oral/Motor/Sensory Function Overall Oral Motor/Sensory Function: Other (comment)(Difficult to determine d/t lethargy and intermittent confusion) Facial ROM: Within Functional Limits Facial Symmetry: Within Functional Limits Facial Sensation: Other (Comment)(Unable to detect facial sensation on oral motor examination) Lingual ROM: Reduced right;Reduced left Lingual Symmetry: Within Functional Limits Velum: Other (comment)(Minimal movement) Mandible: Within Functional Limits   Ice Chips Ice chips: Not tested   Thin Liquid Thin Liquid: Within functional limits Presentation: Cup;Straw    Nectar Thick Nectar Thick Liquid: Not tested   Honey Thick Honey Thick Liquid: Not tested   Puree Puree: Within functional limits Presentation: Spoon   Solid    Everlean Patterson, M.S. CCC-SLP Speech-Language Pathologist  Solid: Within functional limits      Everlean Patterson 03/31/2018,1:52 PM

## 2018-03-31 NOTE — Progress Notes (Signed)
[facility]  ROUNDING NOTE   Subjective:   Off BIPAP. Easily arousable but not alert or oriented.   Hemodialysis treatment last night. UF of 3 liters.   Objective:  Vital signs in last 24 hours:  Temp:  [97.6 F (36.4 C)-98.9 F (37.2 C)] 98.9 F (37.2 C) (12/31 0800) Pulse Rate:  [50-65] 65 (12/31 0800) Resp:  [9-19] 16 (12/31 0800) BP: (91-141)/(38-108) 135/38 (12/31 0800) SpO2:  [55 %-100 %] 99 % (12/31 0800) FiO2 (%):  [28 %] 28 % (12/30 1237) Weight:  [105.4 kg-107.4 kg] 105.4 kg (12/30 2338)  Weight change:  Filed Weights   03/30/18 1151 03/30/18 1930 03/30/18 2338  Weight: 106.7 kg 107.4 kg 105.4 kg    Intake/Output: I/O last 3 completed shifts: In: 310.1 [IV Piggyback:310.1] Out: 3014 [Other:3014]   Intake/Output this shift:  No intake/output data recorded.  Physical Exam: General: Respiratory distress  Head: [place]/AT  Eyes: Anicteric, PERRL  Neck: Supple, trachea midline  Lungs:  Clear to auscultation, 3L [place]   Heart: Regular rate and rhythm  Abdomen:  Soft, nontender, obese  Extremities: Right BKA  Neurologic: Nonfocal, moving all four extremities  Skin: No lesions  Access: Right AVF    Basic Metabolic Panel: Recent Labs  Lab 03/30/18 0828 03/31/18 0335  NA 137 138  K 4.8 3.1*  CL 101 98  CO2 24 30  GLUCOSE 110* 73  BUN 29* 16  CREATININE 5.57* 3.71*  CALCIUM 8.1* 8.2*    Liver Function Tests: Recent Labs  Lab 03/30/18 0828  AST 21  ALT 6  ALKPHOS 142*  BILITOT 0.8  PROT 6.6  ALBUMIN 3.3*   No results for input(s): LIPASE, AMYLASE in the last 168 hours. Recent Labs  Lab 03/30/18 0958  AMMONIA 28    CBC: Recent Labs  Lab 03/30/18 0828 03/31/18 0335  WBC 7.3 5.0  HGB 12.3 11.3*  HCT 42.4 38.4  MCV 97.9 94.8  PLT 183 152    Cardiac Enzymes: No results for input(s): CKTOTAL, CKMB, CKMBINDEX, TROPONINI in the last 168 hours.  BNP: Invalid input(s): POCBNP  CBG: Recent Labs  Lab 03/30/18 1145  03/30/18 1629 03/30/18 2118 03/31/18 0807  GLUCAP 80 92 76 66*    Microbiology: Results for orders placed or performed during the hospital encounter of 03/30/18  MRSA PCR Screening     Status: Abnormal   Collection Time: 03/30/18 11:47 AM  Result Value Ref Range Status   MRSA by PCR POSITIVE (A) NEGATIVE Final    Comment:        The GeneXpert MRSA Assay (FDA approved for NASAL specimens only), is one component of a comprehensive MRSA colonization surveillance program. It is not intended to diagnose MRSA infection nor to guide or monitor treatment for MRSA infections. RESULT CALLED TO, READ BACK BY AND VERIFIED WITHBrynda Greathouse:  ALEXI FRASER AT 1317 03/30/18 SDR Performed at [facility], [address] [place], [place] [address]     Coagulation Studies: No results for input(s): LABPROT, INR in the last 72 hours.  Urinalysis: Recent Labs    03/30/18 1014  COLORURINE AMBER*  LABSPEC 1.019  PHURINE 6.0  GLUCOSEU NEGATIVE  HGBUR MODERATE*  BILIRUBINUR NEGATIVE  KETONESUR NEGATIVE  PROTEINUR >=300*  NITRITE NEGATIVE  LEUKOCYTESUR MODERATE*      Imaging: Ct Head Wo Contrast  Result Date: 03/30/2018 CLINICAL DATA:  Altered level of consciousness, unexplained EXAM: CT HEAD WITHOUT CONTRAST TECHNIQUE: Contiguous axial images were obtained from the base of the skull  through the vertex without intravenous contrast. COMPARISON:  11/24/2017 FINDINGS: Brain: No evidence of acute infarction, hemorrhage, hydrocephalus, extra-axial collection or mass lesion/mass effect. Age advanced volume loss with the cerebellum appears more affected than the cerebrum Vascular: Extensive atherosclerotic calcification Skull: No acute finding Sinuses/Orbits: Bilateral cataract resection. IMPRESSION: 1. No acute finding or change from prior. 2. Age advanced atrophy. Electronically Signed   By: Marnee Spring M.D.   On: 03/30/2018 08:57   Dg Chest Port 1 View  Result Date: 03/31/2018 CLINICAL  DATA:  Acute respiratory failure. History of CHF, coronary artery disease, dialysis dependent renal failure. Now with altered mental status EXAM: PORTABLE CHEST 1 VIEW COMPARISON:  Portable chest x-ray of March 30, 2018 FINDINGS: The lungs are reasonably well inflated. The interstitial markings are more conspicuous. The right pleural effusion has increased somewhat in size. The cardiac silhouette remains enlarged and the pulmonary vascularity engorged. There is calcification in the wall of the aortic arch. IMPRESSION: Interval worsening of interstitial edema secondary to CHF of cardiac or noncardiac cause. Slight interval increase in the small right pleural effusion. Thoracic aortic atherosclerosis. Electronically Signed   By: David  [place] M.D.   On: 03/31/2018 08:02   Dg Chest Portable 1 View  Result Date: 03/30/2018 CLINICAL DATA:  Altered mental status. EXAM: PORTABLE CHEST 1 VIEW COMPARISON:  02/24/2018. FINDINGS: Cardiomegaly with mild interstitial prominence and small right pleural effusion consistent with CHF. Findings are less prominent than noted on prior chest x-ray of 02/24/2018. No pneumothorax. IMPRESSION: Findings suggesting mild congestive heart failure with pulmonary interstitial edema and right pleural effusion. Findings are less prominent than noted on prior chest x-ray of 02/24/2018. Electronically Signed   By: Maisie Fus  Register   On: 03/30/2018 09:24     Medications:   . ciprofloxacin     . ARIPiprazole  2.5 mg Oral BID  . aspirin  300 mg Rectal Daily  . benztropine  0.5 mg Oral BID  . budesonide  0.5 mg Nebulization BID  . chlorhexidine  15 mL Mouth Rinse BID  . clopidogrel  75 mg Oral Q1200  . escitalopram  5 mg Oral Q2000  . fluticasone  1 spray Each Nare Q1200  . heparin  5,000 Units Subcutaneous Q8H  . insulin aspart  0-5 Units Subcutaneous QHS  . insulin aspart  0-9 Units Subcutaneous TID WC  . isosorbide mononitrate  90 mg Oral Q1200  . lactulose  10 g Oral  q1800  . levETIRAcetam  1,500 mg Oral Daily  . lidocaine  1 patch Transdermal QHS  . lidocaine  1 application Topical Q M,W,F-HD  . mouth rinse  15 mL Mouth Rinse q12n4p  . Melatonin  2.5 mg Oral Q2000  . OXcarbazepine  150 mg Oral QHS  . pantoprazole  40 mg Oral BID  . polyvinyl alcohol  1 drop Both Eyes TID  . tiotropium  1 capsule Inhalation Q1200   acetaminophen, acetaminophen, albuterol, bisacodyl, morphine injection, nitroGLYCERIN, ondansetron **OR** ondansetron (ZOFRAN) IV  Assessment/ Plan:  Kimberly Montgomery is a 52 y.o. white female with end stage renal disease on hemodialysis, COPD, diabetes mellitus type II, hypertension, congestive heart failure, coronary artery disease, CVA, seizure disorder, depression/bipolar disorder.   [facility] MWF [facility] [address] Right AVF 106kg.   1. End Stage Renal Disease: hemodialysis treatment yesterday. Tolerated treatment well. UF of 3 liters.  No indication for hemodialysis treatment today. Next scheduled treatment for tomorrow. Continue MWF schedule.   2. Hypertension: Outpatient regimen of  metoprolol, lisinopril, torsemide, amlodipine, hydralazine and isosorbide mononitrate.  Blood pressure at goal Currently only on isosorbide mononitrate.   3. Anemia of chronic kidney disease: hemoglobin 11.3 - no indication for EPO.   4. Secondary Hyperparathyroidism:  - sevelamer with meals. When able to take PO  5. Altered mental status: similar presentation in November 2019. Was secondary to urinary tract infection. UA consistent with infection.  Also with hypercapnea on admission - placed on BIPAP.  - Empiric ciprofloxacin   LOS: 1 Taegen Delker 12/31/20198:48 AM

## 2018-03-31 NOTE — Progress Notes (Signed)
[employer] [facility] [place] at [facility]   PATIENT NAME: Kimberly Montgomery    MR#:  [id number]  DATE OF BIRTH:  May 08, 1965  SUBJECTIVE:  She is off BiPAP  Still confused  REVIEW OF SYSTEMS:    Unable to obtain as patient is confused Tolerating Diet: yes      DRUG ALLERGIES:   Allergies  Allergen Reactions  . Cephalosporins Anaphylaxis    Patient has tolerated meropenem.   [name] Penicillins Anaphylaxis and Other (See Comments)    Has patient had a PCN reaction causing immediate rash, facial/tongue/throat swelling, SOB or lightheadedness with hypotension: Yes Has patient had a PCN reaction causing severe rash involving mucus membranes or skin necrosis: No Has patient had a PCN reaction that required hospitalization No Has patient had a PCN reaction occurring within the last 10 years: No If all of the above answers are "NO", then may proceed with Cephalosporin use.  . Reglan [Metoclopramide] Other (See Comments)    Severe EPS (lip, head, body tremors) March 2018  . Risperdal [Risperidone] Other (See Comments)    Severe EPS (lip, head, body tremors) March 2018  . Lamictal [Lamotrigine] Other (See Comments)    Reaction:  Hallucinations  . Phenergan [Promethazine Hcl] Nausea And Vomiting    Sensitivity to medicine  . Sulfamethoxazole-Trimethoprim     Other reaction(s): hyperkalemia  . Sulfasalazine Other (See Comments)    Reaction:  Unknown   . Pravastatin Other (See Comments)    Reaction:  Muscle pain   . Sulfa Antibiotics Other (See Comments)    Reaction:  Unknown     VITALS:  Blood pressure (!) 132/51, pulse 65, temperature 98.9 F (37.2 C), temperature source Oral, resp. rate 14, height 5\' 9"  (1.753 m), weight 105.4 kg, last menstrual period 03/16/2015, SpO2 100 %.  PHYSICAL EXAMINATION:  Constitutional: Appears well-developed and well-nourished. No distress. HENT: Normocephalic. [name] Oropharynx is clear and moist.  Eyes: Conjunctivae and EOM are normal. PERRLA,  no scleral icterus.  Neck: Normal ROM. Neck supple. No JVD. No tracheal deviation. CVS: RRR, S1/S2 +, no murmurs, no gallops, no carotid bruit.  Pulmonary: Effort and breath sounds normal, no stridor, rhonchi, wheezes, rales.  Abdominal: Soft. BS +,  no distension, tenderness, rebound or guarding.  Musculoskeletal: Normal range of motion. No edema and no tenderness.  Neuro: Alert. . No focal deficits. Skin: Skin is warm and dry. No rash noted. Psychiatric: Confused .      LABORATORY PANEL:   CBC Recent Labs  Lab 03/31/18 0335  WBC 5.0  HGB 11.3*  HCT 38.4  PLT 152   ------------------------------------------------------------------------------------------------------------------  Chemistries  Recent Labs  Lab 03/30/18 0828 03/31/18 0335  NA 137 138  K 4.8 3.1*  CL 101 98  CO2 24 30  GLUCOSE 110* 73  BUN 29* 16  CREATININE 5.57* 3.71*  CALCIUM 8.1* 8.2*  AST 21  --   ALT 6  --   ALKPHOS 142*  --   BILITOT 0.8  --    ------------------------------------------------------------------------------------------------------------------  Cardiac Enzymes No results for input(s): TROPONINI in the last 168 hours. ------------------------------------------------------------------------------------------------------------------  RADIOLOGY:  Ct Head Wo Contrast  Result Date: 03/30/2018 CLINICAL DATA:  Altered level of consciousness, unexplained EXAM: CT HEAD WITHOUT CONTRAST TECHNIQUE: Contiguous axial images were obtained from the base of the skull through the vertex without intravenous contrast. COMPARISON:  11/24/2017 FINDINGS: Brain: No evidence of acute infarction, hemorrhage, hydrocephalus, extra-axial collection or mass lesion/mass effect. Age advanced volume loss with the cerebellum appears  more affected than the cerebrum Vascular: Extensive atherosclerotic calcification Skull: No acute finding Sinuses/Orbits: Bilateral cataract resection. IMPRESSION: 1. No acute  finding or change from prior. 2. Age advanced atrophy. Electronically Signed   By: Marnee SpringJonathon  Watts M.D.   On: 03/30/2018 08:57   Dg Chest Port 1 View  Result Date: 03/31/2018 CLINICAL DATA:  Acute respiratory failure. History of CHF, coronary artery disease, dialysis dependent renal failure. Now with altered mental status EXAM: PORTABLE CHEST 1 VIEW COMPARISON:  Portable chest x-ray of March 30, 2018 FINDINGS: The lungs are reasonably well inflated. The interstitial markings are more conspicuous. The right pleural effusion has increased somewhat in size. The cardiac silhouette remains enlarged and the pulmonary vascularity engorged. There is calcification in the wall of the aortic arch. IMPRESSION: Interval worsening of interstitial edema secondary to CHF of cardiac or noncardiac cause. Slight interval increase in the small right pleural effusion. Thoracic aortic atherosclerosis. Electronically Signed   By: David  SwazilandJordan M.D.   On: 03/31/2018 08:02   Dg Chest Portable 1 View  Result Date: 03/30/2018 CLINICAL DATA:  Altered mental status. EXAM: PORTABLE CHEST 1 VIEW COMPARISON:  02/24/2018. FINDINGS: Cardiomegaly with mild interstitial prominence and small right pleural effusion consistent with CHF. Findings are less prominent than noted on prior chest x-ray of 02/24/2018. No pneumothorax. IMPRESSION: Findings suggesting mild congestive heart failure with pulmonary interstitial edema and right pleural effusion. Findings are less prominent than noted on prior chest x-ray of 02/24/2018. Electronically Signed   By: Maisie Fushomas  Register   On: 03/30/2018 09:24   [place] Abdomen Limited Ruq  Result Date: 03/31/2018 CLINICAL DATA:  Two day history of right upper quadrant pain EXAM: ULTRASOUND ABDOMEN LIMITED RIGHT UPPER QUADRANT COMPARISON:  Abdominal and pelvic CT scan of September 06, 2017 and right upper quadrant abdominal ultrasound of March 28, 2017. FINDINGS: Gallbladder: The gallbladder is adequately distended.  There is at least 1 echogenic stone which measures up to 8 mm in diameter. There is no gallbladder wall thickening, pericholecystic fluid, or positive sonographic Murphy's sign. Common bile duct: Diameter: 4 mm Liver: The hepatic echotexture is normal. The surface contour is smooth. There is no focal mass nor ductal dilation. Portal vein is patent on color Doppler imaging with normal direction of blood flow towards the liver. Incidental note is made of increased echotexture of the cortex of the right kidney as compared to the liver. There is also a right pleural effusion. IMPRESSION: Gallstones without sonographic evidence of acute cholecystitis. Normal appearance of the common bile duct and liver. Increased echotexture of the renal cortex on the right may reflect medical renal disease. There is a right pleural effusion. Electronically Signed   By: David  SwazilandJordan M.D.   On: 03/31/2018 09:22     ASSESSMENT AND PLAN:  52 year old female with end-stage renal disease on hemodialysis, seizure disorder and chronic diastolic heart failure with preserved ejection fraction who presented to the ER due to altered mental status.  .  Acute encephalopathy in the setting of hypercarbia urinary tract infection: Mental status has shown improvement since admission however not quite at baseline  2.  Acute hypoxic sick/hypercarbic respiratory failure: Patient has been weaned from BiPAP to baseline nasal cannula  3.  End-stage renal disease: Patient received emergent dialysis yesterday and will continue her routine outpatient schedule.  4.  UTI: Continue ciprofloxacin Follow-up on final urine culture 5  History of seizure disorder: Continue Keppra and Trileptal 6.  Psych issues: Continue Abilify, Cogentin, Lexapro 7.  CAD:  Continue Plavix, isosorbide 8.  Hypokalemia: replete if ok with nephrology.   Management plans discussed with nursing   CODE STATUS: FULL  TOTAL TIME TAKING CARE OF THIS PATIENT: 28 minutes.      POSSIBLE D/C 1-2 days, DEPENDING ON CLINICAL CONDITION.   Tashea Othman M.D on 03/31/2018 at 11:14 AM  Between 7am to 6pm - Pager - [phone] After 6pm go to [email or web address] - password [employer]  [facility]  Office  [phone]  CC: Primary care physician; Dorothey Baseman, MD  Note: This dictation was prepared with Dragon dictation along with smaller phrase technology. Any transcriptional errors that result from this process are unintentional.

## 2018-03-31 NOTE — Progress Notes (Signed)
Patient bed alarm sounded. Patient sitting on the floor, alert. Asked patient what happen. Stated "I slid on my ass" Asked if she hit her head. Responded no. VSS. Patient returned to bed. Low bed ordered. Floor mats in place. Bed alarm on. MD notified. Family called. No answer. Left vmail to return call. Post fall flowsheet complete.

## 2018-04-01 LAB — BASIC METABOLIC PANEL
ANION GAP: 8 (ref 5–15)
BUN: 22 mg/dL — ABNORMAL HIGH (ref 6–20)
CO2: 31 mmol/L (ref 22–32)
Calcium: 8.2 mg/dL — ABNORMAL LOW (ref 8.9–10.3)
Chloride: 98 mmol/L (ref 98–111)
Creatinine, Ser: 4.82 mg/dL — ABNORMAL HIGH (ref 0.44–1.00)
GFR calc non Af Amer: 10 mL/min — ABNORMAL LOW (ref >60)
GFR, EST AFRICAN AMERICAN: 11 mL/min — AB (ref >60)
Glucose, Bld: 103 mg/dL — ABNORMAL HIGH (ref 70–99)
Potassium: 3.5 mmol/L (ref 3.5–5.1)
Sodium: 137 mmol/L (ref 135–145)

## 2018-04-01 LAB — GLUCOSE, CAPILLARY
Glucose-Capillary: 85 mg/dL (ref 70–99)
Glucose-Capillary: 67 mg/dL — ABNORMAL LOW (ref 70–99)
Glucose-Capillary: 86 mg/dL (ref 70–99)
Glucose-Capillary: 110 mg/dL — ABNORMAL HIGH (ref 70–99)

## 2018-04-01 LAB — CBC
HCT: 39.4 % (ref 36.0–46.0)
Hemoglobin: 11.5 g/dL — ABNORMAL LOW (ref 12.0–15.0)
MCH: 28.3 pg (ref 26.0–34.0)
MCHC: 29.2 g/dL — ABNORMAL LOW (ref 30.0–36.0)
MCV: 97 fL (ref 80.0–100.0)
Platelets: 135 10*3/uL — ABNORMAL LOW (ref 150–400)
RBC: 4.06 MIL/uL (ref 3.87–5.11)
RDW: 16.6 % — ABNORMAL HIGH (ref 11.5–15.5)
WBC: 5.6 10*3/uL (ref 4.0–10.5)
nRBC: 0 % (ref 0.0–0.2)

## 2018-04-01 MED ORDER — CIPROFLOXACIN IN D5W 400 MG/200ML IV SOLN: 400.0000 mg | INTRAVENOUS | Status: DC

## 2018-04-01 NOTE — Progress Notes (Signed)
Post HD assessment    04/01/18 1402  Neurological  Level of Consciousness Alert  Orientation Level Oriented to person;Disoriented to place;Disoriented to time;Disoriented to situation  Respiratory  Respiratory Pattern Regular;Unlabored  Chest Assessment Chest expansion symmetrical  Cardiac  ECG Monitor Yes  Cardiac Rhythm NSR  Vascular  R Radial Pulse +2  L Radial Pulse +2  Edema Generalized  Integumentary  Integumentary (WDL) X  Skin Color Appropriate for ethnicity  Musculoskeletal  Musculoskeletal (WDL) X  Generalized Weakness Yes  Assistive Device None  GU Assessment  Genitourinary (WDL) X  Genitourinary Symptoms  (HD)

## 2018-04-01 NOTE — Progress Notes (Signed)
Pre HD assessment    04/01/18 1027  Vital Signs  Temp 98.8 F (37.1 C)  Temp Source Oral  Pulse Rate 70  Pulse Rate Source Monitor  Resp 17  BP (!) 133/51  BP Location Left Arm  BP Method Automatic  Patient Position (if appropriate) Lying  Oxygen Therapy  SpO2 100 %  O2 Device Nasal Cannula  O2 Flow Rate (L/min) 3 L/min  Pain Assessment  Pain Scale 0-10  Pain Score 0  Dialysis Weight  Weight 123.8 kg  Type of Weight Pre-Dialysis  Time-Out for Hemodialysis  What Procedure? HD  Pt Identifiers(min of two) First/Last Name;MRN/Account#  Correct Site? Yes  Correct Side? Yes  Correct Procedure? Yes  Consents Verified? Yes  Rad Studies Available? N/A  Safety Precautions Reviewed? Yes  Biochemist, clinical Number  (7A)  Station Number 4  UF/Alarm Test Passed  Conductivity: Meter 14  Conductivity: Machine  14.2  pH 7.4  Reverse Osmosis main  Normal Saline Lot Number [id number]  Dialyzer Lot Number [id number]  Disposable Set Lot Number [id number]  Machine Temperature 98.6 F (37 C)  Immunologist and Audible Yes  Blood Lines Intact and Secured Yes  Pre Treatment Patient Checks  Vascular access used during treatment Fistula  Hepatitis B Surface Antigen Results Negative  Date Hepatitis B Surface Antigen Drawn 01/12/18  Hepatitis B Surface Antibody  (>10)  Date Hepatitis B Surface Antibody Drawn 01/12/18  Hemodialysis Consent Verified Yes  Hemodialysis Standing Orders Initiated Yes  ECG (Telemetry) Monitor On Yes  Prime Ordered Normal Saline  Length of  DialysisTreatment -hour(s) 3.5 Hour(s)  Dialyzer Elisio 17H NR  Dialysate 3K, 2.5 Ca  Dialysis Anticoagulant None  Dialysate Flow Ordered 600  Blood Flow Rate Ordered 400 mL/min  Ultrafiltration Goal 3 Liters  Dialysis Blood Pressure Support Ordered Normal Saline  Education / Care Plan  Dialysis Education Provided Yes  Documented Education in Care Plan Yes

## 2018-04-01 NOTE — Clinical Social Work Note (Signed)
Patient is a long term resident at [facility]. Patient is off the unit for dialysis. CSW attempted to reach patient's daughter: Santo Held but her phone went straight to voice mail. York Spaniel MSW,LCSW [phone]

## 2018-04-01 NOTE — Progress Notes (Signed)
[facility]  ROUNDING NOTE   Subjective:   Seen and examined on hemodialysis. Continues to be confused. Complains of pain in buttocks.   Tolerating treatment well. UF goal of 3 liters.   Was placed on D5 1/2NS infusion overnight.   Antibiotics switched to meropenem.   Objective:  Vital signs in last 24 hours:  Temp:  [98.2 F (36.8 C)-98.8 F (37.1 C)] 98.8 F (37.1 C) (01/01 1027) Pulse Rate:  [66-92] 77 (01/01 1230) Resp:  [16-26] 26 (01/01 1230) BP: (101-151)/(41-119) 101/41 (01/01 1230) SpO2:  [92 %-100 %] 100 % (01/01 1230) Weight:  [123.8 kg] 123.8 kg (01/01 1027)  Weight change:  Filed Weights   03/30/18 1930 03/30/18 2338 04/01/18 1027  Weight: 107.4 kg 105.4 kg 123.8 kg    Intake/Output: I/O last 3 completed shifts: In: 307.5 [I.V.:307.5] Out: 3014 [Other:3014]   Intake/Output this shift:  No intake/output data recorded.  Physical Exam: General: Respiratory distress  Head: [place]/AT  Eyes: Anicteric, PERRL  Neck: Supple, trachea midline  Lungs:  Clear to auscultation, 3L [place]   Heart: Regular rate and rhythm  Abdomen:  Soft, nontender, obese  Extremities: Right BKA  Neurologic: Nonfocal, moving all four extremities  Skin: No lesions  Access: Right AVF    Basic Metabolic Panel: Recent Labs  Lab 03/30/18 0828 03/31/18 0335 04/01/18 0457  NA 137 138 137  K 4.8 3.1* 3.5  CL 101 98 98  CO2 24 30 31   GLUCOSE 110* 73 103*  BUN 29* 16 22*  CREATININE 5.57* 3.71* 4.82*  CALCIUM 8.1* 8.2* 8.2*    Liver Function Tests: Recent Labs  Lab 03/30/18 0828  AST 21  ALT 6  ALKPHOS 142*  BILITOT 0.8  PROT 6.6  ALBUMIN 3.3*   No results for input(s): LIPASE, AMYLASE in the last 168 hours. Recent Labs  Lab 03/30/18 0958  AMMONIA 28    CBC: Recent Labs  Lab 03/30/18 0828 03/31/18 0335 04/01/18 0941  WBC 7.3 5.0 5.6  HGB 12.3 11.3* 11.5*  HCT 42.4 38.4 39.4  MCV 97.9 94.8 97.0  PLT 183 152 135*    Cardiac Enzymes: No  results for input(s): CKTOTAL, CKMB, CKMBINDEX, TROPONINI in the last 168 hours.  BNP: Invalid input(s): POCBNP  CBG: Recent Labs  Lab 03/31/18 0847 03/31/18 1209 03/31/18 1653 03/31/18 2233 04/01/18 0740  GLUCAP 99 79 113* 114* 85    Microbiology: Results for orders placed or performed during the hospital encounter of 03/30/18  Urine Culture     Status: Abnormal (Preliminary result)   Collection Time: 03/30/18 10:14 AM  Result Value Ref Range Status   Specimen Description   Final    URINE, CLEAN CATCH Performed at [facility], [address], [place], [place] [address]    Special Requests   Final    Normal Performed at [facility], [address], [place], [place] [address]    Culture (A)  Final    >=100,000 COLONIES/mL ENTEROCOCCUS RAFFINOSUS SUSCEPTIBILITIES TO FOLLOW Performed at [facility], [address] [place], [place] [address]    Report Status PENDING  Incomplete  MRSA PCR Screening     Status: Abnormal   Collection Time: 03/30/18 11:47 AM  Result Value Ref Range Status   MRSA by PCR POSITIVE (A) NEGATIVE Final    Comment:        The GeneXpert MRSA Assay (FDA approved for NASAL specimens only), is one component of a comprehensive MRSA colonization surveillance program.  It is not intended to diagnose MRSA infection nor to guide or monitor treatment for MRSA infections. RESULT CALLED TO, READ BACK BY AND VERIFIED WITHBrynda Greathouse AT 1317 03/30/18 SDR Performed at [facility], [address] [place], [place] [address]     Coagulation Studies: No results for input(s): LABPROT, INR in the last 72 hours.  Urinalysis: Recent Labs    03/30/18 1014  COLORURINE AMBER*  LABSPEC 1.019  PHURINE 6.0  GLUCOSEU NEGATIVE  HGBUR MODERATE*  BILIRUBINUR NEGATIVE  KETONESUR NEGATIVE  PROTEINUR >=300*  NITRITE NEGATIVE  LEUKOCYTESUR MODERATE*      Imaging: Dg Chest Port 1 View  Result Date:  03/31/2018 CLINICAL DATA:  Acute respiratory failure. History of CHF, coronary artery disease, dialysis dependent renal failure. Now with altered mental status EXAM: PORTABLE CHEST 1 VIEW COMPARISON:  Portable chest x-ray of March 30, 2018 FINDINGS: The lungs are reasonably well inflated. The interstitial markings are more conspicuous. The right pleural effusion has increased somewhat in size. The cardiac silhouette remains enlarged and the pulmonary vascularity engorged. There is calcification in the wall of the aortic arch. IMPRESSION: Interval worsening of interstitial edema secondary to CHF of cardiac or noncardiac cause. Slight interval increase in the small right pleural effusion. Thoracic aortic atherosclerosis. Electronically Signed   By: David  Swaziland M.D.   On: 03/31/2018 08:02   US Abdomen Limited Ruq  Result Date: 03/31/2018 CLINICAL DATA:  Two day history of right upper quadrant pain EXAM: ULTRASOUND ABDOMEN LIMITED RIGHT UPPER QUADRANT COMPARISON:  Abdominal and pelvic CT scan of September 06, 2017 and right upper quadrant abdominal ultrasound of March 28, 2017. FINDINGS: Gallbladder: The gallbladder is adequately distended. There is at least 1 echogenic stone which measures up to 8 mm in diameter. There is no gallbladder wall thickening, pericholecystic fluid, or positive sonographic Murphy's sign. Common bile duct: Diameter: 4 mm Liver: The hepatic echotexture is normal. The surface contour is smooth. There is no focal mass nor ductal dilation. Portal vein is patent on color Doppler imaging with normal direction of blood flow towards the liver. Incidental note is made of increased echotexture of the cortex of the right kidney as compared to the liver. There is also a right pleural effusion. IMPRESSION: Gallstones without sonographic evidence of acute cholecystitis. Normal appearance of the common bile duct and liver. Increased echotexture of the renal cortex on the right may reflect medical  renal disease. There is a right pleural effusion. Electronically Signed   By: David  [place]aziland M.D.   On: 03/31/2018 09:22     Medications:   . meropenem (MERREM) IV 500 mg (03/31/18 2043)   . ARIPiprazole  2.5 mg Oral BID  . aspirin  300 mg Rectal Daily  . benztropine  0.5 mg Oral BID  . budesonide  0.5 mg Nebulization BID  . chlorhexidine  15 mL Mouth Rinse BID  . Chlorhexidine Gluconate Cloth  6 each Topical Q0600  . clopidogrel  75 mg Oral Q1200  . escitalopram  5 mg Oral Q2000  . fluticasone  1 spray Each Nare Q1200  . heparin  5,000 Units Subcutaneous Q8H  . insulin aspart  0-5 Units Subcutaneous QHS  . insulin aspart  0-9 Units Subcutaneous TID WC  . isosorbide mononitrate  90 mg Oral Q1200  . lactulose  10 g Oral q1800  . levETIRAcetam  1,500 mg Oral Daily  . lidocaine  1 patch Transdermal QHS  . lidocaine  1 application Topical Q M,W,F-HD  .  mouth rinse  15 mL Mouth Rinse q12n4p  . Melatonin  2.5 mg Oral Q2000  . mupirocin ointment  1 application Nasal BID  . OXcarbazepine  150 mg Oral QHS  . pantoprazole  40 mg Oral BID  . polyvinyl alcohol  1 drop Both Eyes TID  . tiotropium  1 capsule Inhalation Q1200   acetaminophen, acetaminophen, albuterol, bisacodyl, morphine injection, nitroGLYCERIN, ondansetron **OR** ondansetron (ZOFRAN) IV  Assessment/ Plan:  [name] is a 53 y.o. white female with end stage renal disease on hemodialysis, COPD, diabetes mellitus type II, hypertension, congestive heart failure, coronary artery disease, CVA, seizure disorder, depression/bipolar disorder.   [facility] MWF [address] Right AVF 106kg.   1. End Stage Renal Disease: seen and examined on hemodialysis treatment. UF of 3 liters.   2. Hypertension: Outpatient regimen of metoprolol, lisinopril, torsemide, amlodipine, hydralazine and isosorbide mononitrate.  Blood pressure at goal Currently only on isosorbide mononitrate.   3. Anemia of chronic kidney disease:  hemoglobin 11.35 - no indication for EPO.   4. Secondary Hyperparathyroidism:  - sevelamer with meals. When able to take PO  5. Altered mental status: similar presentation in November 2019. Was secondary to urinary tract infection. UA consistent with infection.  Also with hypercapnea on admission - placed on BIPAP on admission.  - Empiric meropenem.    LOS: 2 Kimberly Montgomery 1/1/20201:08 PM

## 2018-04-01 NOTE — Progress Notes (Signed)
HD tx start    04/01/18 1042  Vital Signs  Pulse Rate 68  Pulse Rate Source Monitor  Resp (!) 21  BP (!) 139/57  BP Location Left Arm  BP Method Automatic  Patient Position (if appropriate) Lying  Oxygen Therapy  SpO2 100 %  O2 Device Nasal Cannula  O2 Flow Rate (L/min) 3 L/min  During Hemodialysis Assessment  Blood Flow Rate (mL/min) 400 mL/min  Arterial Pressure (mmHg) -170 mmHg  Venous Pressure (mmHg) 180 mmHg  Transmembrane Pressure (mmHg) 60 mmHg  Ultrafiltration Rate (mL/min) 1170 mL/min  Dialysate Flow Rate (mL/min) 600 ml/min  Conductivity: Machine  14.3  HD Safety Checks Performed Yes  Dialysis Fluid Bolus Normal Saline  Bolus Amount (mL) 250 mL  Intra-Hemodialysis Comments Tx initiated

## 2018-04-01 NOTE — Progress Notes (Signed)
Pre HD assessment    04/01/18 1028  Neurological  Level of Consciousness Responds to Voice  Orientation Level Oriented to person;Disoriented to place;Disoriented to time;Disoriented to situation  Respiratory  Respiratory Pattern Regular;Unlabored  Chest Assessment Chest expansion symmetrical  Cardiac  ECG Monitor Yes  Cardiac Rhythm NSR  Vascular  Edema Generalized  Integumentary  Integumentary (WDL) X  Skin Color Appropriate for ethnicity  Musculoskeletal  Musculoskeletal (WDL) X  Generalized Weakness Yes  Assistive Device None  GU Assessment  Genitourinary (WDL) X  Genitourinary Symptoms  (HD)  Psychosocial  Psychosocial (WDL) X  Patient Behaviors Cooperative;Other (Comment) (lethargic)

## 2018-04-01 NOTE — Progress Notes (Addendum)
[facility] - [place] at [facility]   PATIENT NAME: Kimberly Montgomery    MR#:  [id number]  DATE OF BIRTH:  03/31/1966  SUBJECTIVE:  No new complaint this morning.  Patient awake and alert.  Still pleasantly confused.  No fevers.  Scheduled for hemodialysis today.  REVIEW OF SYSTEMS:    Unable to obtain as patient is confused  A DRUG ALLERGIES:   Allergies  Allergen Reactions  . Cephalosporins Anaphylaxis    Patient has tolerated meropenem.   [name]. Penicillins Anaphylaxis and Other (See Comments)    Has patient had a PCN reaction causing immediate rash, facial/tongue/throat swelling, SOB or lightheadedness with hypotension: Yes Has patient had a PCN reaction causing severe rash involving mucus membranes or skin necrosis: No Has patient had a PCN reaction that required hospitalization No Has patient had a PCN reaction occurring within the last 10 years: No If all of the above answers are "NO", then may proceed with Cephalosporin use.  . Reglan [Metoclopramide] Other (See Comments)    Severe EPS (lip, head, body tremors) March 2018  . Risperdal [Risperidone] Other (See Comments)    Severe EPS (lip, head, body tremors) March 2018  . Lamictal [Lamotrigine] Other (See Comments)    Reaction:  Hallucinations  . Phenergan [Promethazine Hcl] Nausea And Vomiting    Sensitivity to medicine  . Sulfamethoxazole-Trimethoprim     Other reaction(s): hyperkalemia  . Sulfasalazine Other (See Comments)    Reaction:  Unknown   . Pravastatin Other (See Comments)    Reaction:  Muscle pain   . Sulfa Antibiotics Other (See Comments)    Reaction:  Unknown     VITALS:  Blood pressure (!) 135/42, pulse 78, temperature 98.6 F (37 C), temperature source Oral, resp. rate (!) 21, height 5\' 9"  (1.753 m), weight 122.5 kg, last menstrual period 03/16/2015, SpO2 100 %.  PHYSICAL EXAMINATION:  Constitutional: Appears well-developed and well-nourished. No distress.  Disoriented. HENT:  Normocephalic. [name]. Oropharynx is clear and moist.  Eyes: Conjunctivae and EOM are normal. PERRLA, no scleral icterus.  Neck: Normal ROM. Neck supple. No JVD. No tracheal deviation. CVS: RRR, S1/S2 +, no murmurs, no gallops, no carotid bruit.  Pulmonary: Effort and breath sounds normal, no stridor, rhonchi, wheezes, rales.  Abdominal: Soft. BS +,  no distension, tenderness, rebound or guarding.  Musculoskeletal: Patient status post right below-knee amputation previously. Neuro: Alert. . No focal deficits. Skin: Skin is warm and dry. No rash noted. Psychiatric: Confused .     LABORATORY PANEL:   CBC Recent Labs  Lab 04/01/18 0941  WBC 5.6  HGB 11.5*  HCT 39.4  PLT 135*   ------------------------------------------------------------------------------------------------------------------  Chemistries  Recent Labs  Lab 03/30/18 0828  04/01/18 0457  NA 137   < > 137  K 4.8   < > 3.5  CL 101   < > 98  CO2 24   < > 31  GLUCOSE 110*   < > 103*  BUN 29*   < > 22*  CREATININE 5.57*   < > 4.82*  CALCIUM 8.1*   < > 8.2*  AST 21  --   --   ALT 6  --   --   ALKPHOS 142*  --   --   BILITOT 0.8  --   --    < > = values in this interval not displayed.   ------------------------------------------------------------------------------------------------------------------  Cardiac Enzymes No results for input(s): TROPONINI in the last 168 hours. ------------------------------------------------------------------------------------------------------------------  RADIOLOGY:  Dg  Chest Port 1 View  Result Date: 03/31/2018 CLINICAL DATA:  Acute respiratory failure. History of CHF, coronary artery disease, dialysis dependent renal failure. Now with altered mental status EXAM: PORTABLE CHEST 1 VIEW COMPARISON:  Portable chest x-ray of March 30, 2018 FINDINGS: The lungs are reasonably well inflated. The interstitial markings are more conspicuous. The right pleural effusion has increased somewhat  in size. The cardiac silhouette remains enlarged and the pulmonary vascularity engorged. There is calcification in the wall of the aortic arch. IMPRESSION: Interval worsening of interstitial edema secondary to CHF of cardiac or noncardiac cause. Slight interval increase in the small right pleural effusion. Thoracic aortic atherosclerosis. Electronically Signed   By: David  [place] M.D.   On: 03/31/2018 08:02   US Abdomen Limited Ruq  Result Date: 03/31/2018 CLINICAL DATA:  Two day history of right upper quadrant pain EXAM: ULTRASOUND ABDOMEN LIMITED RIGHT UPPER QUADRANT COMPARISON:  Abdominal and pelvic CT scan of September 06, 2017 and right upper quadrant abdominal ultrasound of March 28, 2017. FINDINGS: Gallbladder: The gallbladder is adequately distended. There is at least 1 echogenic stone which measures up to 8 mm in diameter. There is no gallbladder wall thickening, pericholecystic fluid, or positive sonographic Murphy's sign. Common bile duct: Diameter: 4 mm Liver: The hepatic echotexture is normal. The surface contour is smooth. There is no focal mass nor ductal dilation. Portal vein is patent on color Doppler imaging with normal direction of blood flow towards the liver. Incidental note is made of increased echotexture of the cortex of the right kidney as compared to the liver. There is also a right pleural effusion. IMPRESSION: Gallstones without sonographic evidence of acute cholecystitis. Normal appearance of the common bile duct and liver. Increased echotexture of the renal cortex on the right may reflect medical renal disease. There is a right pleural effusion. Electronically Signed   By: David  [place] M.D.   On: 03/31/2018 09:22     ASSESSMENT AND PLAN:  53 year old female with end-stage renal disease on hemodialysis, seizure disorder and chronic diastolic heart failure with preserved ejection fraction who presented to the ER due to altered mental status.  1.  Acute encephalopathy in the  setting of hypercarbia urinary tract infection: Mental status documented to have shown improvement since admission however not quite at baseline. Patient awake and alert but still remains disoriented. CT head without contrast already done with no acute findings.  Continue treatment of urinary tract infection as outlined below.   2.  Acute hypoxic sick/hypercarbic respiratory failure: Patient has been weaned from BiPAP to baseline nasal cannula. Follow-up ABG in a.m.  Nocturnal oxygen saturation of 100% on room air this morning.  3.  End-stage renal disease:  Nephrology service following.  Patient scheduled for hemodialysis today.   4.  UTI: Continue ciprofloxacin to complete at least 5-day treatment duration pending results of urine culture and sensitivities. Follow-up on final urine culture  5  History of seizure disorder: Continue Keppra and Trileptal  6.  Psych issues: Continue Abilify, Cogentin, Lexapro  7.  CAD: Continue Plavix, isosorbide  DVT prophylaxis; heparin  Management plans discussed with nursing at bedside   CODE STATUS: FULL  TOTAL TIME TAKING CARE OF THIS PATIENT: 30 minutes.     POSSIBLE D/C 1-2 days, DEPENDING ON CLINICAL CONDITION.   Sally-Anne Wamble M.D on 04/01/2018 at 2:44 PM  With tachycardia After 6pm go to [email or web address] - Social research officer, government  [facility]  Office  [phone]  CC: Primary care physician; Dorothey Baseman,  MD  Note: This dictation was prepared with Dragon dictation along with smaller phrase technology. Any transcriptional errors that result from this process are unintentional.  Notified by pharmacy staff patient already on IV meropenem with renal dosing.  No indication to add ciprofloxacin.  Continue IV meropenem to complete treatment duration pending results of culture and sensitivities.

## 2018-04-01 NOTE — Progress Notes (Signed)
First attempt to get report prior to HD tx, RN unavailable to give report.    04/01/18 0835  Hand-Off documentation  Report given to (Full Name) Chilton Greathouse  Report received from (Full Name) Donny Pique

## 2018-04-01 NOTE — Progress Notes (Signed)
Post HD assessment. Pt tolerated tx well without c/o or complication. Net UF 3017, goal met.    04/01/18 1406  Vital Signs  Temp 98.6 F (37 C)  Temp Source Oral  Pulse Rate 78  Pulse Rate Source Monitor  Resp (!) 21  BP (!) 135/42  BP Location Left Arm  BP Method Automatic  Patient Position (if appropriate) Lying  Oxygen Therapy  SpO2 100 %  O2 Device Nasal Cannula  O2 Flow Rate (L/min) 3 L/min  Dialysis Weight  Weight 122.5 kg  Type of Weight Post-Dialysis  Post-Hemodialysis Assessment  Rinseback Volume (mL) 250 mL  KECN 62.8 V  Dialyzer Clearance Lightly streaked  Duration of HD Treatment -hour(s) 3.5 hour(s)  Hemodialysis Intake (mL) 500 mL  UF Total -Machine (mL) 3517 mL  Net UF (mL) 3017 mL  Tolerated HD Treatment Yes  AVG/AVF Arterial Site Held (minutes) 10 minutes  AVG/AVF Venous Site Held (minutes) 10 minutes  Education / Care Plan  Dialysis Education Provided Yes  Documented Education in Care Plan Yes

## 2018-04-01 NOTE — NC FL2 (Signed)
[place] MEDICAID FL2 LEVEL OF CARE SCREENING TOOL     IDENTIFICATION  Patient Name: Kimberly Montgomery Birthdate: 1966-03-15 Sex: female Admission Date (Current Location): 03/30/2018  [place] and [place] Number:  Chiropodist and Address:  [facility], [address], [place], [place] [address]      Provider Number: [id number]  Attending Physician Name and Address:  Jama Flavors, MD  Relative Name and Phone Number:       Current Level of Care: Hospital Recommended Level of Care: Skilled Nursing Facility Prior Approval Number:    Date Approved/Denied:   PASRR Number:    Discharge Plan: SNF    Current Diagnoses: Patient Active Problem List   Diagnosis Date Noted  . Acute respiratory failure with hypercapnia (HCC) 03/30/2018  . Hyperkalemia 07/01/2017  . Pneumonia 05/15/2017  . Benign essential tremor 01/30/2017  . Seizure (HCC)   . Acute encephalopathy   . Tremor   . Cerebrovascular disease   . Complication from renal dialysis device 04/08/2016  . Colitis 03/13/2016  . Chronic diastolic congestive heart failure (HCC) 01/22/2016  . Pressure injury of skin 01/20/2016  . Bacteremia, coagulase-negative staphylococcal 12/27/2015  . Essential hypertension 11/21/2015  . Anemia of chronic disease 11/21/2015  . Acute respiratory failure with hypoxia (HCC) 11/21/2015  . ESRD on dialysis (HCC) 11/21/2015  . 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 NSTEMI (non-ST elevated myocardial infarction) (HCC) 11/20/2015  . Chest pain, rule out acute myocardial infarction 11/18/2015  . Bipolar I disorder, most recent episode depressed (HCC)   . Altered mental status 08/24/2015  . Ileus (HCC)   . Bipolar I disorder (HCC) 07/25/2015  . Seizures (HCC) 07/25/2015  . Peritonitis (HCC) 07/16/2015  . Unstable angina (HCC) 07/09/2015  . Accelerated hypertension 07/09/2015  . Type 2 diabetes mellitus (HCC) 07/09/2015  . CAD (coronary artery disease) 07/09/2015  . HLD  (hyperlipidemia) 07/09/2015  . GERD (gastroesophageal reflux disease) 07/09/2015    Orientation RESPIRATION BLADDER Height & Weight     Self, Place  O2, Normal(3 liters) Incontinent Weight: 273 lb (123.8 kg) Height:  5\' 9"  (175.3 cm)  BEHAVIORAL SYMPTOMS/MOOD NEUROLOGICAL BOWEL NUTRITION STATUS  (none) (none) Continent Diet(renal)  AMBULATORY STATUS COMMUNICATION OF NEEDS Skin   Extensive Assist Verbally PU Stage and Appropriate Care                       Personal Care Assistance Level of Assistance  Dressing, Feeding, Bathing Bathing Assistance: Maximum assistance Feeding assistance: Maximum assistance Dressing Assistance: Maximum assistance     Functional Limitations Info  (none noted)          SPECIAL CARE FACTORS FREQUENCY                       Contractures Contractures Info: Not present    Additional Factors Info  Code Status Code Status Info: full             Current Medications (04/01/2018):  This is the current hospital active medication list Current Facility-Administered Medications  Medication Dose Route Frequency Provider Last Rate Last Dose  . acetaminophen (TYLENOL) suppository 650 mg  650 mg Rectal Q6H PRN Alford Highland, MD      . acetaminophen (TYLENOL) tablet 500 mg  500 mg Oral Q6H PRN Wieting, Richard, MD      . albuterol (PROVENTIL) (2.5 MG/3ML) 0.083% nebulizer solution 2.5 mg  2.5 mg Nebulization Q4H PRN Alford Highland, MD      .  ARIPiprazole (ABILIFY) tablet 2.5 mg  2.5 mg Oral BID Alford HighlandWieting, Richard, MD   2.5 mg at 03/31/18 2033  . aspirin suppository 300 mg  300 mg Rectal Daily Alford HighlandWieting, Richard, MD   300 mg at 03/31/18 1039  . benztropine (COGENTIN) tablet 0.5 mg  0.5 mg Oral BID Alford HighlandWieting, Richard, MD   0.5 mg at 03/30/18 1726  . bisacodyl (DULCOLAX) suppository 10 mg  10 mg Rectal PRN Alford HighlandWieting, Richard, MD      . budesonide (PULMICORT) nebulizer solution 0.5 mg  0.5 mg Nebulization BID Alford HighlandWieting, Richard, MD   0.5 mg at 04/01/18  0737  . chlorhexidine (PERIDEX) 0.12 % solution 15 mL  15 mL Mouth Rinse BID Conforti, John, DO   15 mL at 03/31/18 2241  . Chlorhexidine Gluconate Cloth 2 % PADS 6 each  6 each Topical Q0600 Mody, Sital, MD      . clopidogrel (PLAVIX) tablet 75 mg  75 mg Oral Q1200 Wieting, Richard, MD      . escitalopram (LEXAPRO) tablet 5 mg  5 mg Oral Q2000 Alford HighlandWieting, Richard, MD   5 mg at 03/31/18 2240  . fluticasone (FLONASE) 50 MCG/ACT nasal spray 1 spray  1 spray Each Nare Q1200 Alford HighlandWieting, Richard, MD   1 spray at 03/30/18 1544  . heparin injection 5,000 Units  5,000 Units Subcutaneous Q8H Alford HighlandWieting, Richard, MD   5,000 Units at 04/01/18 [phone]  . insulin aspart (novoLOG) injection 0-5 Units  0-5 Units Subcutaneous QHS Wieting, Richard, MD      . insulin aspart (novoLOG) injection 0-9 Units  0-9 Units Subcutaneous TID WC Wieting, Richard, MD      . isosorbide mononitrate (IMDUR) 24 hr tablet 90 mg  90 mg Oral Q1200 Wieting, Richard, MD      . lactulose (CHRONULAC) 10 GM/15ML solution 10 g  10 g Oral q1800 Alford HighlandWieting, Richard, MD   10 g at 03/30/18 1725  . levETIRAcetam (KEPPRA) tablet 1,500 mg  1,500 mg Oral Daily Wieting, Richard, MD      . lidocaine (LIDODERM) 5 % 1 patch  1 patch Transdermal QHS Alford HighlandWieting, Richard, MD   1 patch at 03/31/18 2239  . lidocaine (LMX) 4 % cream 1 application  1 application Topical Q M,W,F-HD Alford HighlandWieting, Richard, MD   1 application at 03/30/18 1900  . MEDLINE mouth rinse  15 mL Mouth Rinse q12n4p Conforti, John, DO   15 mL at 03/30/18 1544  . Melatonin TABS 2.5 mg  2.5 mg Oral Q2000 Alford HighlandWieting, Richard, MD   2.5 mg at 03/31/18 2033  . meropenem (MERREM) 500 mg in sodium chloride 0.9 % 100 mL IVPB  500 mg Intravenous q1800 Bertram SavinSimpson, Bechler L, RPH 200 mL/hr at 03/31/18 2043 500 mg at 03/31/18 2043  . morphine 2 MG/ML injection 1-2 mg  1-2 mg Intravenous Q4H PRN Eugenie NorrieBlakeney, Dana G, NP   2 mg at 04/01/18 1241  . mupirocin ointment (BACTROBAN) 2 % 1 application  1 application Nasal BID Adrian SaranMody, Sital, MD    1 application at 03/31/18 2239  . nitroGLYCERIN (NITROSTAT) SL tablet 0.4 mg  0.4 mg Sublingual Q5 Min x 3 PRN Wieting, Richard, MD      . ondansetron [facility](ZOFRAN) tablet 4 mg  4 mg Oral Q6H PRN Wieting, Richard, MD       Or  . ondansetron [facility](ZOFRAN) injection 4 mg  4 mg Intravenous Q6H PRN Alford HighlandWieting, Richard, MD   4 mg at 04/01/18 0905  . OXcarbazepine (TRILEPTAL) tablet 150 mg  150 mg  Oral QHS Alford Highland, MD   150 mg at 03/31/18 2241  . pantoprazole (PROTONIX) EC tablet 40 mg  40 mg Oral BID Alford Highland, MD   40 mg at 03/31/18 2243  . polyvinyl alcohol (LIQUIFILM TEARS) 1.4 % ophthalmic solution 1 drop  1 drop Both Eyes TID Alford Highland, MD   1 drop at 03/31/18 2243  . tiotropium (SPIRIVA) inhalation capsule ([facility] use ONLY) 18 mcg  1 capsule Inhalation Q1200 Alford Highland, MD   18 mcg at 03/30/18 1304     Discharge Medications: Please see discharge summary for a list of discharge medications.  Relevant Imaging Results:  Relevant Lab Results:   Additional Information    York Spaniel, LCSW

## 2018-04-01 NOTE — Progress Notes (Signed)
HD tx end    04/01/18 1401  Vital Signs  Pulse Rate 77  Pulse Rate Source Monitor  Resp 19  BP 125/64  BP Location Left Arm  BP Method Automatic  Patient Position (if appropriate) Lying  Oxygen Therapy  SpO2 100 %  O2 Device Nasal Cannula  O2 Flow Rate (L/min) 3 L/min  During Hemodialysis Assessment  Dialysis Fluid Bolus Normal Saline  Bolus Amount (mL) 250 mL  Intra-Hemodialysis Comments Tx completed

## 2018-04-02 ENCOUNTER — Inpatient Hospital Stay: Payer: Medicare HMO

## 2018-04-02 LAB — BLOOD GAS, ARTERIAL
ACID-BASE EXCESS: 5.1 mmol/L — AB (ref 0.0–2.0)
Bicarbonate: 32.2 mmol/L — ABNORMAL HIGH (ref 20.0–28.0)
FIO2: 0.28
O2 Saturation: 92.5 %
Patient temperature: 37
pCO2 arterial: 57 mmHg — ABNORMAL HIGH (ref 32.0–48.0)
pH, Arterial: 7.36 (ref 7.350–7.450)
pO2, Arterial: 68 mmHg — ABNORMAL LOW (ref 83.0–108.0)

## 2018-04-02 LAB — BASIC METABOLIC PANEL
Anion gap: 14 (ref 5–15)
BUN: 17 mg/dL (ref 6–20)
CO2: 23 mmol/L (ref 22–32)
Calcium: 8.5 mg/dL — ABNORMAL LOW (ref 8.9–10.3)
Chloride: 102 mmol/L (ref 98–111)
Creatinine, Ser: 4.1 mg/dL — ABNORMAL HIGH (ref 0.44–1.00)
GFR calc Af Amer: 13 mL/min — ABNORMAL LOW (ref >60)
GFR calc non Af Amer: 12 mL/min — ABNORMAL LOW (ref >60)
Glucose, Bld: 165 mg/dL — ABNORMAL HIGH (ref 70–99)
Potassium: 5.3 mmol/L — ABNORMAL HIGH (ref 3.5–5.1)
Sodium: 139 mmol/L (ref 135–145)

## 2018-04-02 LAB — GLUCOSE, CAPILLARY
GLUCOSE-CAPILLARY: 150 mg/dL — AB (ref 70–99)
Glucose-Capillary: 147 mg/dL — ABNORMAL HIGH (ref 70–99)
Glucose-Capillary: 133 mg/dL — ABNORMAL HIGH (ref 70–99)
Glucose-Capillary: 117 mg/dL — ABNORMAL HIGH (ref 70–99)
Glucose-Capillary: 84 mg/dL (ref 70–99)

## 2018-04-02 LAB — CBC
HCT: 43.4 % (ref 36.0–46.0)
Hemoglobin: 12.4 g/dL (ref 12.0–15.0)
MCH: 28.1 pg (ref 26.0–34.0)
MCHC: 28.6 g/dL — ABNORMAL LOW (ref 30.0–36.0)
MCV: 98.4 fL (ref 80.0–100.0)
Platelets: 74 10*3/uL — ABNORMAL LOW (ref 150–400)
RBC: 4.41 MIL/uL (ref 3.87–5.11)
RDW: 17 % — ABNORMAL HIGH (ref 11.5–15.5)
WBC: 14.1 10*3/uL — ABNORMAL HIGH (ref 4.0–10.5)
nRBC: 0 % (ref 0.0–0.2)

## 2018-04-02 LAB — URINE CULTURE
Culture: >=100000 — AB
Special Requests: NORMAL

## 2018-04-02 LAB — LACTIC ACID, PLASMA
LACTIC ACID, VENOUS: 2.1 mmol/L — AB (ref 0.5–1.9)
Lactic Acid, Venous: 2.8 mmol/L (ref 0.5–1.9)

## 2018-04-02 LAB — PHOSPHORUS: Phosphorus: 4.7 mg/dL — ABNORMAL HIGH (ref 2.5–4.6)

## 2018-04-02 LAB — MAGNESIUM: Magnesium: 2.3 mg/dL (ref 1.7–2.4)

## 2018-04-02 MED ORDER — ASCORBIC ACID 500 MG/ML IJ SOLN
500.0000 mg | Freq: Two times a day (BID) | INTRAMUSCULAR | Status: AC
  Administered 2018-04-03 – 2018-04-04 (×3): 500 mg via INTRAVENOUS
  Filled 2018-04-02 (×13): qty 1

## 2018-04-02 MED ORDER — LEVETIRACETAM 500 MG PO TABS
1000.0000 mg | ORAL_TABLET | Freq: Every day | ORAL | Status: DC
  Administered 2018-04-02 – 2018-04-07 (×5): 1000 mg via ORAL
  Filled 2018-04-02 (×5): qty 2

## 2018-04-02 MED ORDER — LORAZEPAM 2 MG/ML IJ SOLN
1.0000 mg | Freq: Once | INTRAMUSCULAR | Status: AC
  Administered 2018-04-02: 1 mg via INTRAVENOUS
  Filled 2018-04-02: qty 1

## 2018-04-02 MED ORDER — SODIUM CHLORIDE 0.9 % IV SOLN
INTRAVENOUS | Status: DC
  Administered 2018-04-02: 10:00:00 via INTRAVENOUS

## 2018-04-02 MED ORDER — LORAZEPAM BOLUS VIA INFUSION: 1.0000 mg | Freq: Once | INTRAVENOUS | Status: DC

## 2018-04-02 MED ORDER — PATIROMER SORBITEX CALCIUM 8.4 G PO PACK
16.8000 g | PACK | Freq: Once | ORAL | Status: DC
  Filled 2018-04-02 (×2): qty 2

## 2018-04-02 MED ORDER — NEPRO/CARBSTEADY PO LIQD
237.0000 mL | Freq: Three times a day (TID) | ORAL | Status: DC
  Administered 2018-04-04 – 2018-04-06 (×4): 237 mL via ORAL

## 2018-04-02 MED ORDER — LORAZEPAM 2 MG/ML IJ SOLN: 1.0000 mg | Freq: Once | INTRAMUSCULAR | Status: DC

## 2018-04-02 MED ORDER — VANCOMYCIN HCL 10 G IV SOLR
2000.0000 mg | Freq: Once | INTRAVENOUS | Status: AC
  Administered 2018-04-02: 2000 mg via INTRAVENOUS
  Filled 2018-04-02: qty 2000

## 2018-04-02 MED ORDER — VANCOMYCIN HCL IN DEXTROSE 1-5 GM/200ML-% IV SOLN
1000.0000 mg | INTRAVENOUS | Status: DC
  Administered 2018-04-06: 1000 mg via INTRAVENOUS
  Filled 2018-04-02 (×2): qty 200

## 2018-04-02 MED ORDER — RENA-VITE PO TABS
1.0000 | ORAL_TABLET | Freq: Every day | ORAL | Status: DC
  Administered 2018-04-02 – 2018-04-05 (×4): 1 via ORAL
  Filled 2018-04-02 (×4): qty 1

## 2018-04-02 NOTE — Consult Note (Signed)
Pharmacy Antibiotic Note  Kimberly Montgomery is a 53 y.o. female admitted on 03/30/2018 with AMS and a UTI.  This morning she has met the criteria for sepsis. Pharmacy has been consulted for vancomycin dosing.  Plan: Vancomycin 2000 mg IV once then 1000 mg IV with every MWF HD session.  Goal pre-HD level 15-25 mcg/mL. A level will be drawn prior to the 3rd HD session.  Height: '5\' 9"'  (175.3 cm) Weight: 270 lb (122.5 kg) IBW/kg (Calculated) : 66.2  Temp (24hrs), Avg:98.4 F (36.9 C), Min:97.7 F (36.5 C), Max:98.8 F (37.1 C)  Recent Labs  Lab 03/30/18 0828 03/31/18 0335 04/01/18 0457 04/01/18 0941 04/02/18 0542 04/02/18 0725 04/02/18 1054  WBC 7.3 5.0  --  5.6 14.1*  --   --   CREATININE 5.57* 3.71* 4.82*  --  4.10*  --   --   LATICACIDVEN  --   --   --   --   --  2.8* 2.1*    Estimated Creatinine Clearance: 22 mL/min (A) (by C-G formula based on SCr of 4.1 mg/dL (H)).    Allergies  Allergen Reactions  . Cephalosporins Anaphylaxis    Patient has tolerated meropenem.   [name] Penicillins Anaphylaxis and Other (See Comments)    Has patient had a PCN reaction causing immediate rash, facial/tongue/throat swelling, SOB or lightheadedness with hypotension: Yes Has patient had a PCN reaction causing severe rash involving mucus membranes or skin necrosis: No Has patient had a PCN reaction that required hospitalization No Has patient had a PCN reaction occurring within the last 10 years: No If all of the above answers are "NO", then may proceed with Cephalosporin use.  . Reglan [Metoclopramide] Other (See Comments)    Severe EPS (lip, head, body tremors) March 2018  . Risperdal [Risperidone] Other (See Comments)    Severe EPS (lip, head, body tremors) March 2018  . Lamictal [Lamotrigine] Other (See Comments)    Reaction:  Hallucinations  . Phenergan [Promethazine Hcl] Nausea And Vomiting    Sensitivity to medicine  . Sulfamethoxazole-Trimethoprim     Other reaction(s): hyperkalemia   . Sulfasalazine Other (See Comments)    Reaction:  Unknown   . Pravastatin Other (See Comments)    Reaction:  Muscle pain   . Sulfa Antibiotics Other (See Comments)    Reaction:  Unknown     Antimicrobials this admission: meropenem 12/31 >>  vancomycin 1/2 >>   Microbiology results: 12/30 UCx: E raffinosus R to Levaquin only  12/30 MRSA PCR: positive  Thank you for allowing pharmacy to be a part of this patient's care.  [name] 04/02/2018 11:43 AM

## 2018-04-02 NOTE — Progress Notes (Signed)
SLP Cancellation Note  Patient Details Name: Kimberly Montgomery MRN: [id number] DOB: March 08, 1966   Cancelled treatment:       Reason Eval/Treat Not Completed: Fatigue/lethargy limiting ability to participate;Patient at procedure or test/unavailable; Chart reviewed. Discussed pt with nursing who reported pt has become increasingly confused and agitated. Nursing reported pt hasn't had anything to eat due to lethargy and AMS and requested SLP to re-evaluate swallow due to decline in status. Attempted x2, first attempt at approximately 1400, pt was observed to be markedly agitated, yelling out and loudly moaning, decided to re-attempt later this afternoon at 16:15 in hopes pt would be less agitated and more appropriate for PO trials; however pt was off floor for MRI. Dietician contacted SLP to notify downgrade of diet to Dysphagia II due to concerns with swallow safety due to lethargy and confusion. Rec. Nursing feed pt ONLY IF ALERT, Feed slowly, small bites and sips, STOP FEEDING WITH ANY S/S ASPIRATION. SLP to reattempt re-assessment of swallow tomorrow.   Brei Pociask, MA, CCC-SLP 04/02/2018, 5:11 PM

## 2018-04-02 NOTE — Progress Notes (Signed)
[employer] [facility] [place] [facility]   PATIENT NAME: Kimberly Montgomery    MR#:  [id number]  DATE OF BIRTH:  55/08/1965  SUBJECTIVE:  This morning patient noted to be more lethargic.  For no fevers.  Intermittently yelling.  Concern for possible abdominal pains.  Stat ABG done.  Lactic acid level checked.  Abdominal imaging and MRI of the brain requested.  Patient had recent negative CT head.  REVIEW OF SYSTEMS:    Unable to obtain as patient is confused  A DRUG ALLERGIES:   Allergies  Allergen Reactions  . Cephalosporins Anaphylaxis    Patient has tolerated meropenem.   [name]. Penicillins Anaphylaxis and Other (See Comments)    Has patient had a PCN reaction causing immediate rash, facial/tongue/throat swelling, SOB or lightheadedness with hypotension: Yes Has patient had a PCN reaction causing severe rash involving mucus membranes or skin necrosis: No Has patient had a PCN reaction that required hospitalization No Has patient had a PCN reaction occurring within the last 10 years: No If all of the above answers are "NO", then may proceed with Cephalosporin use.  . Reglan [Metoclopramide] Other (See Comments)    Severe EPS (lip, head, body tremors) March 2018  . Risperdal [Risperidone] Other (See Comments)    Severe EPS (lip, head, body tremors) March 2018  . Lamictal [Lamotrigine] Other (See Comments)    Reaction:  Hallucinations  . Phenergan [Promethazine Hcl] Nausea And Vomiting    Sensitivity to medicine  . Sulfamethoxazole-Trimethoprim     Other reaction(s): hyperkalemia  . Sulfasalazine Other (See Comments)    Reaction:  Unknown   . Pravastatin Other (See Comments)    Reaction:  Muscle pain   . Sulfa Antibiotics Other (See Comments)    Reaction:  Unknown     VITALS:  Blood pressure (!) 118/54, pulse 100, temperature 98.8 F (37.1 C), resp. rate 20, height 5\' 9"  (1.753 m), weight 122.5 kg, last menstrual period 03/16/2015, SpO2 93 %.  PHYSICAL EXAMINATION:   Constitutional: Appears well-developed and well-nourished. No distress.  More cardiologist.  Consult cardiology.  In the person as well as disoriented. HENT: Normocephalic. [name]. Oropharynx is clear and moist.  Eyes: Conjunctivae and EOM are normal. PERRLA, no scleral icterus.  Neck: Normal ROM. Neck supple. No JVD. No tracheal deviation. CVS: RRR, S1/S2 +, no murmurs, no gallops, no carotid bruit.  Pulmonary: Effort and breath sounds normal, no stridor, rhonchi, wheezes, rales.  Abdominal: Soft. BS +,  no distension, tenderness, rebound or guarding.  Musculoskeletal: Patient status post right below-knee amputation previously. Neuro: Alert. . No focal deficits. Skin: Skin is warm and dry. No rash noted. Psychiatric: Confused .     LABORATORY PANEL:   CBC Recent Labs  Lab 04/02/18 0542  WBC 14.1*  HGB 12.4  HCT 43.4  PLT 74*   ------------------------------------------------------------------------------------------------------------------  Chemistries  Recent Labs  Lab 03/30/18 0828  04/02/18 0542  NA 137   < > 139  K 4.8   < > 5.3*  CL 101   < > 102  CO2 24   < > 23  GLUCOSE 110*   < > 165*  BUN 29*   < > 17  CREATININE 5.57*   < > 4.10*  CALCIUM 8.1*   < > 8.5*  MG  --   --  2.3  AST 21  --   --   ALT 6  --   --   ALKPHOS 142*  --   --  BILITOT 0.8  --   --    < > = values in this interval not displayed.   ------------------------------------------------------------------------------------------------------------------  Cardiac Enzymes No results for input(s): TROPONINI in the last 168 hours. ------------------------------------------------------------------------------------------------------------------  RADIOLOGY:  Ct Abdomen Pelvis Wo Contrast  Result Date: 04/02/2018 CLINICAL DATA:  Generalize acute abdominal pain. EXAM: CT ABDOMEN AND PELVIS WITHOUT CONTRAST TECHNIQUE: Multidetector CT imaging of the abdomen and pelvis was performed following the standard  protocol without IV contrast. COMPARISON:  CT scan 09/06/2017 FINDINGS: Lower chest: There is a moderate-sized right pleural effusion with moderate overlying atelectasis. Very small left pleural effusion with overlying atelectasis. The heart is enlarged but appears stable. Stable advanced three-vessel coronary artery calcifications. No pericardial effusion. Hepatobiliary: No focal hepatic lesions or intrahepatic biliary dilatation. The gallbladder contains small gallstones and probable gallbladder sludge. No gallbladder wall thickening or pericholecystic fluid to suggest acute cholecystitis. No common bile duct dilatation. Pancreas: Stable advanced pancreatic atrophy but no mass, inflammation or ductal dilatation. Spleen: Borderline in size but stable. Adrenals/Urinary Tract: The adrenal glands and kidneys are stable. Extensive renal artery vascular calcifications. No worrisome renal lesions or hydronephrosis. The bladder demonstrates moderate wall thickening. Could not exclude cystitis. No bladder calculi or mass. No ureteral calculi or dilatation. Stomach/Bowel: The stomach, duodenum, small bowel and colon are grossly normal without oral contrast. No acute inflammatory changes, mass lesions or obstructive findings. The terminal ileum and appendix appear normal. Moderate diffuse rectal wall thickening appears stable since prior CT scans. Vascular/Lymphatic: Advanced vascular calcifications but no aneurysm. Numerous scattered mesenteric and retroperitoneal lymph nodes are stable. Reproductive: The uterus and ovaries are unremarkable. Other: Mesenteric edema, small amount of abdominal ascites and fairly massive subcutaneous edema suggesting anasarca. Musculoskeletal: No significant bony findings. Prominent Schmorl's nodes are noted in the spine. IMPRESSION: 1. Moderate-sized right pleural effusion with overlying atelectasis. 2. Mesenteric edema, small volume ascites and fairly massive body wall edema suggesting  anasarca. 3. Cholelithiasis and probable gallbladder sludge but no definite CT findings for acute cholecystitis. 4. Mild diffuse bladder wall thickening could suggest cystitis. 5. Advanced vascular disease. 6. Diffuse rectal wall thickening appears stable since multiple prior CT scans and is of uncertain significance or etiology. Electronically Signed   By: Rudie Meyer M.D.   On: 04/02/2018 10:45     ASSESSMENT AND PLAN:  53 year old female with end-stage renal disease on hemodialysis, seizure disorder and chronic diastolic heart failure with preserved ejection fraction who presented to the ER due to altered mental status.  1.  Acute encephalopathy in the setting of hypercarbia urinary tract infection:  Patient appears more lethargic today.  Was already given pain medications due to concern for abdominal pains.   Recent CT head without contrast already done with no acute findings.  Continue treatment of urinary tract infection as outlined below.  MRI of the brain requested to rule out CVA.   Patient currently on rectal aspirin. Recently evaluated by speech therapist and currently on dysphagia 3 diet with thin liquids.   2.  Acute hypoxic sick/hypercarbic respiratory failure: Patient has been weaned from BiPAP to baseline nasal cannula. Follow-up ABG today improved  3.  End-stage renal disease:  Nephrology service following.  Patient had hemodialysis yesterday.  Next hemodialysis tomorrow  4.  UTI: Currently on IV meropenem.  Noted elevated lactic acid level this morning.  Initial concern for possible developing sepsis.  Placed on gentle IV fluid hydration at 50 cc an hour.  Repeat lactic acid level trended down.  Urine  culture growing enterococcus raffinosus sensitive to vancomycin.  Vancomycin added for gram-positive coverage. Patient appears to have evidence of fluid overload as evidenced on CT abdomen and pelvis with evidence of anasarca.  CT abdomen and pelvis showed moderate sized right  pleural effusion with overlying atelectasis.  Mesenteric edema.  Small volume ascites with massive body wall edema suggestive of anasarca.  Cholelithiasis with probable gallbladder sludge but no definite CT evidence of acute cholecystitis.  Requested for abdominal ultrasound for further evaluation.   IV fluid discontinued.  For hemodialysis in a.m.   5  History of seizure disorder: Continue Keppra and Trileptal  6.  Psych issues: Continue Abilify, Cogentin, Lexapro  7.  CAD: Continue Plavix, isosorbide  DVT prophylaxis; heparin  Management plans discussed with nursing at bedside   CODE STATUS: FULL  TOTAL TIME TAKING CARE OF THIS PATIENT: 35 minutes.     POSSIBLE D/C 2 days, DEPENDING ON CLINICAL CONDITION.   Raeli Wiens M.D on 04/02/2018 at 3:00 PM  7 AM to 6 PM;[phone]  After 6pm go to [email or web address] - password [email or web address]  [facility]  Office  [phone]  CC: Primary care physician; Dorothey Baseman, MD  Note: This dictation was prepared with Dragon dictation along with smaller phrase technology. Any transcriptional errors that result from this process are unintentional.  Notified by pharmacy staff patient already on IV meropenem with renal dosing.  No indication to add ciprofloxacin.  Continue IV meropenem to complete treatment duration pending results of culture and sensitivities.

## 2018-04-02 NOTE — Progress Notes (Signed)
Initial Nutrition Assessment  DOCUMENTATION CODES:   Obesity unspecified  INTERVENTION:   Nepro Shake po TID, each supplement provides 425 kcal and 19 grams protein  Rena-vite daily   Vitamin C 500mg  IV BID x 3 days  Dysphagia 2 diet   NUTRITION DIAGNOSIS:   Inadequate oral intake related to acute illness(AMS) as evidenced by meal completion < 50%.  GOAL:   Patient will meet greater than or equal to 90% of their needs  MONITOR:   PO intake, Labs, Supplement acceptance, Weight trends, Skin, I & O's  REASON FOR ASSESSMENT:   Consult Assessment of nutrition requirement/status  ASSESSMENT:   53 y.o. white female with end stage renal disease on hemodialysis, COPD, diabetes mellitus type II, hypertension, congestive heart failure, coronary artery disease, CVA, seizure disorder, depression/bipolar disorder admitted with UTI and AMS    Visited pt's room today. Unable to obtain any nutrition related history as pt with AMS. Pt extremely agitated and yelling out at time of RD visit. RN and NT in pt's room trying to feed pt and administer medications. Pt was able to eat some of her lunch today; pt ate some collard greens, bread pudding w/ gravy and drank some liquids. Per NT report, pt drinks better than she eats. RN concerned that pt is not chewing or swallowing the "chunkier" foods and is requesting to downgrade diet. Pt did projectile vomiting once after eating today; pt does have a h/o gastroparesis. Spoke to SLP; notified them of pt's decline in mental status. Will downgrade diet to dysphagia 2 until mental status improves. Pt to have MRI later today. RD will add oral supplement drinks as pt is drinking well and may do better with this. Will also add IV vitamin C as pt with AMS and ecchymosis and at high risk for scurvy given her chronic HD history and sacral wound. Per chart, it appears pt's UBW is around ~240lbs. Pt is currently up ~35lbs since admit; pt is noted to have edema and  anasarca on CT scan. RD will monitor for mental status and the need for nutritional support vs diet upgrade.   Medications reviewed and include: aspirin, plavix, insulin, lactulose, melatonin, protonix, veltassa, meropenem, vancomycin   Labs reviewed: K 5.3(H), P 4.7(H), Mg 2.3 wnl Wbc- 14.1(H)  NUTRITION - FOCUSED PHYSICAL EXAM:    Most Recent Value  Orbital Region  No depletion  Upper Arm Region  No depletion  Thoracic and Lumbar Region  No depletion  Buccal Region  No depletion  Temple Region  No depletion  Clavicle Bone Region  No depletion  Clavicle and Acromion Bone Region  No depletion  Scapular Bone Region  No depletion  Dorsal Hand  No depletion  Patellar Region  Mild depletion  Anterior Thigh Region  Mild depletion  Posterior Calf Region  Mild depletion  Edema (RD Assessment)  Mild  Hair  Reviewed  Eyes  Reviewed  Mouth  Reviewed  Skin  Reviewed Ekin.Harts ]  Nails  Reviewed     Diet Order:   Diet Order            DIET DYS 3 Room service appropriate? Yes; Fluid consistency: Thin  Diet effective now             EDUCATION NEEDS:   No education needs have been identified at this time  Skin:  Skin Assessment: Reviewed RN Assessment(Stage I sacrum, ecchymosis )  Last BM:  PTA  Height:   Ht Readings from Last 1 Encounters:  03/30/18 5\' 9"  (1.753 m)    Weight:   Wt Readings from Last 1 Encounters:  04/01/18 122.5 kg    Ideal Body Weight:  62 kg(adjusted for R BKA)  BMI:  Body mass index is 39.87 kg/m.  Estimated Nutritional Needs:   Kcal:  2100-2400kcal/day   Protein:  109-130g/day   Fluid:  UOP + 1L  Betsey Holiday MS, RD, LDN Pager #- [phone] Office#- [phone] After Hours Pager: [phone]

## 2018-04-02 NOTE — Progress Notes (Signed)
[facility]  ROUNDING NOTE   Subjective:   Mental status has not improved. Lactic acid elevated. Started on IV fluids.   CT abd/pelvis with anasarca.   Objective:  Vital signs in last 24 hours:  Temp:  [97.7 F (36.5 C)-98.8 F (37.1 C)] 98.8 F (37.1 C) (01/02 0513) Pulse Rate:  [74-100] 100 (01/02 0513) Resp:  [15-26] 20 (01/02 0513) BP: (95-135)/(41-64) 118/54 (01/02 0513) SpO2:  [93 %-100 %] 93 % (01/02 0513) FiO2 (%):  [28 %] 28 % (01/01 1954) Weight:  [122.5 kg] 122.5 kg (01/01 1406)  Weight change:  Filed Weights   03/30/18 2338 04/01/18 1027 04/01/18 1406  Weight: 105.4 kg 123.8 kg 122.5 kg    Intake/Output: I/O last 3 completed shifts: In: 0  Out: 3017 [Other:3017]   Intake/Output this shift:  Total I/O In: 206.9 [I.V.:102.8; IV Piggyback:104.2] Out: 0   Physical Exam: General: Respiratory distress  Head: [place]/AT  Eyes: Anicteric, PERRL  Neck: Supple, trachea midline  Lungs:  Clear to auscultation, 3L [place]   Heart: Regular rate and rhythm  Abdomen:  Soft, nontender, obese  Extremities: Right BKA  Neurologic: somnolent  Skin: No lesions  Access: Right AVF    Basic Metabolic Panel: Recent Labs  Lab 03/30/18 0828 03/31/18 0335 04/01/18 0457 04/02/18 0542  NA 137 138 137 139  K 4.8 3.1* 3.5 5.3*  CL 101 98 98 102  CO2 24 30 31 23   GLUCOSE 110* 73 103* 165*  BUN 29* 16 22* 17  CREATININE 5.57* 3.71* 4.82* 4.10*  CALCIUM 8.1* 8.2* 8.2* 8.5*  MG  --   --   --  2.3  PHOS  --   --   --  4.7*    Liver Function Tests: Recent Labs  Lab 03/30/18 0828  AST 21  ALT 6  ALKPHOS 142*  BILITOT 0.8  PROT 6.6  ALBUMIN 3.3*   No results for input(s): LIPASE, AMYLASE in the last 168 hours. Recent Labs  Lab 03/30/18 0958  AMMONIA 28    CBC: Recent Labs  Lab 03/30/18 0828 03/31/18 0335 04/01/18 0941 04/02/18 0542  WBC 7.3 5.0 5.6 14.1*  HGB 12.3 11.3* 11.5* 12.4  HCT 42.4 38.4 39.4 43.4  MCV 97.9 94.8 97.0 98.4  PLT 183 152  135* 74*    Cardiac Enzymes: No results for input(s): CKTOTAL, CKMB, CKMBINDEX, TROPONINI in the last 168 hours.  BNP: Invalid input(s): POCBNP  CBG: Recent Labs  Lab 04/01/18 1522 04/01/18 1626 04/01/18 2056 04/02/18 0815 04/02/18 1155  GLUCAP 67* 86 110* 147* 150*    Microbiology: Results for orders placed or performed during the hospital encounter of 03/30/18  Urine Culture     Status: Abnormal   Collection Time: 03/30/18 10:14 AM  Result Value Ref Range Status   Specimen Description   Final    URINE, CLEAN CATCH Performed at [facility], [address], [place], [place] [address]    Special Requests   Final    Normal Performed at [facility], [address] [place], [place] [address]    Culture >=100,000 COLONIES/mL ENTEROCOCCUS RAFFINOSUS (A)  Final   Report Status 04/02/2018 FINAL  Final   Organism ID, Bacteria ENTEROCOCCUS RAFFINOSUS (A)  Final      Susceptibility   Enterococcus raffinosus - MIC*    AMPICILLIN 8 SENSITIVE Sensitive     LEVOFLOXACIN >=8 RESISTANT Resistant     NITROFURANTOIN 32 SENSITIVE Sensitive     VANCOMYCIN <=0.5 SENSITIVE Sensitive     * >=  100,000 COLONIES/mL ENTEROCOCCUS RAFFINOSUS  MRSA PCR Screening     Status: Abnormal   Collection Time: 03/30/18 11:47 AM  Result Value Ref Range Status   MRSA by PCR POSITIVE (A) NEGATIVE Final    Comment:        The GeneXpert MRSA Assay (FDA approved for NASAL specimens only), is one component of a comprehensive MRSA colonization surveillance program. It is not intended to diagnose MRSA infection nor to guide or monitor treatment for MRSA infections. RESULT CALLED TO, READ BACK BY AND VERIFIED WITHBrynda Greathouse AT 1317 03/30/18 SDR Performed at [facility], [address] [place], [place] [address]     Coagulation Studies: No results for input(s): LABPROT, INR in the last 72 hours.  Urinalysis: No results for input(s): COLORURINE, LABSPEC, PHURINE,  GLUCOSEU, HGBUR, BILIRUBINUR, KETONESUR, PROTEINUR, UROBILINOGEN, NITRITE, LEUKOCYTESUR in the last 72 hours.  Invalid input(s): APPERANCEUR    Imaging: Ct Abdomen Pelvis Wo Contrast  Result Date: 04/02/2018 CLINICAL DATA:  Generalize acute abdominal pain. EXAM: CT ABDOMEN AND PELVIS WITHOUT CONTRAST TECHNIQUE: Multidetector CT imaging of the abdomen and pelvis was performed following the standard protocol without IV contrast. COMPARISON:  CT scan 09/06/2017 FINDINGS: Lower chest: There is a moderate-sized right pleural effusion with moderate overlying atelectasis. Very small left pleural effusion with overlying atelectasis. The heart is enlarged but appears stable. Stable advanced three-vessel coronary artery calcifications. No pericardial effusion. Hepatobiliary: No focal hepatic lesions or intrahepatic biliary dilatation. The gallbladder contains small gallstones and probable gallbladder sludge. No gallbladder wall thickening or pericholecystic fluid to suggest acute cholecystitis. No common bile duct dilatation. Pancreas: Stable advanced pancreatic atrophy but no mass, inflammation or ductal dilatation. Spleen: Borderline in size but stable. Adrenals/Urinary Tract: The adrenal glands and kidneys are stable. Extensive renal artery vascular calcifications. No worrisome renal lesions or hydronephrosis. The bladder demonstrates moderate wall thickening. Could not exclude cystitis. No bladder calculi or mass. No ureteral calculi or dilatation. Stomach/Bowel: The stomach, duodenum, small bowel and colon are grossly normal without oral contrast. No acute inflammatory changes, mass lesions or obstructive findings. The terminal ileum and appendix appear normal. Moderate diffuse rectal wall thickening appears stable since prior CT scans. Vascular/Lymphatic: Advanced vascular calcifications but no aneurysm. Numerous scattered mesenteric and retroperitoneal lymph nodes are stable. Reproductive: The uterus and ovaries  are unremarkable. Other: Mesenteric edema, small amount of abdominal ascites and fairly massive subcutaneous edema suggesting anasarca. Musculoskeletal: No significant bony findings. Prominent Schmorl's nodes are noted in the spine. IMPRESSION: 1. Moderate-sized right pleural effusion with overlying atelectasis. 2. Mesenteric edema, small volume ascites and fairly massive body wall edema suggesting anasarca. 3. Cholelithiasis and probable gallbladder sludge but no definite CT findings for acute cholecystitis. 4. Mild diffuse bladder wall thickening could suggest cystitis. 5. Advanced vascular disease. 6. Diffuse rectal wall thickening appears stable since multiple prior CT scans and is of uncertain significance or etiology. Electronically Signed   By: Rudie Meyer M.D.   On: 04/02/2018 10:45     Medications:   . sodium chloride 50 mL/hr at 04/02/18 0956  . meropenem (MERREM) IV Stopped (04/01/18 1945)  . vancomycin 2,000 mg (04/02/18 1148)  . [START ON 04/03/2018] vancomycin     . ARIPiprazole  2.5 mg Oral BID  . aspirin  300 mg Rectal Daily  . benztropine  0.5 mg Oral BID  . budesonide  0.5 mg Nebulization BID  . chlorhexidine  15 mL Mouth Rinse BID  . Chlorhexidine Gluconate Cloth  6 each  Topical Q0600  . clopidogrel  75 mg Oral Q1200  . escitalopram  5 mg Oral Q2000  . fluticasone  1 spray Each Nare Q1200  . heparin  5,000 Units Subcutaneous Q8H  . insulin aspart  0-5 Units Subcutaneous QHS  . insulin aspart  0-9 Units Subcutaneous TID WC  . isosorbide mononitrate  90 mg Oral Q1200  . lactulose  10 g Oral q1800  . levETIRAcetam  1,000 mg Oral QHS  . lidocaine  1 patch Transdermal QHS  . lidocaine  1 application Topical Q M,W,F-HD  . mouth rinse  15 mL Mouth Rinse q12n4p  . Melatonin  2.5 mg Oral Q2000  . mupirocin ointment  1 application Nasal BID  . OXcarbazepine  150 mg Oral QHS  . pantoprazole  40 mg Oral BID  . patiromer  16.8 g Oral Once  . polyvinyl alcohol  1 drop Both Eyes  TID  . tiotropium  1 capsule Inhalation Q1200   acetaminophen, acetaminophen, albuterol, bisacodyl, morphine injection, nitroGLYCERIN, ondansetron **OR** ondansetron (ZOFRAN) IV  Assessment/ Plan:  [name] is a 53 y.o. white female with end stage renal disease on hemodialysis, COPD, diabetes mellitus type II, hypertension, congestive heart failure, coronary artery disease, CVA, seizure disorder, depression/bipolar disorder.   [facility] MWF [address] Right AVF 106kg.   1. End Stage Renal Disease: hemodialysis treatment yesterday. Tolerated treatment well. UF of 3 liters. Next treatment for tomorrow.   2. Hypertension: Outpatient regimen of metoprolol, lisinopril, torsemide, amlodipine, hydralazine and isosorbide mononitrate.  Blood pressure at goal Currently only on isosorbide mononitrate.   3. Anemia of chronic kidney disease:   - no indication for EPO.   4. Secondary Hyperparathyroidism:  - sevelamer with meals. When able to take PO  5. Altered mental status: similar presentation in November 2019. Was secondary to urinary tract infection. UA consistent with infection.  Also with hypercapnea on admission - placed on BIPAP on admission.  - Empiric meropenem. - MRI brain pending.    LOS: 3 Pang Robers 1/2/202012:15 PM

## 2018-04-02 NOTE — Progress Notes (Signed)
Patient had one episode of projectile vomiting this morning. zofran given IV. Patient currently resting in bed without distress. Will continue to monitor

## 2018-04-03 LAB — CBC
HCT: 40.1 % (ref 36.0–46.0)
Hemoglobin: 12 g/dL (ref 12.0–15.0)
MCH: 28.7 pg (ref 26.0–34.0)
MCHC: 29.9 g/dL — AB (ref 30.0–36.0)
MCV: 95.9 fL (ref 80.0–100.0)
Platelets: 67 10*3/uL — ABNORMAL LOW (ref 150–400)
RBC: 4.18 MIL/uL (ref 3.87–5.11)
RDW: 17 % — ABNORMAL HIGH (ref 11.5–15.5)
WBC: 11.1 10*3/uL — ABNORMAL HIGH (ref 4.0–10.5)
nRBC: 0 % (ref 0.0–0.2)

## 2018-04-03 LAB — GLUCOSE, CAPILLARY
GLUCOSE-CAPILLARY: 113 mg/dL — AB (ref 70–99)
GLUCOSE-CAPILLARY: 105 mg/dL — AB (ref 70–99)
Glucose-Capillary: 119 mg/dL — ABNORMAL HIGH (ref 70–99)
Glucose-Capillary: 94 mg/dL (ref 70–99)

## 2018-04-03 LAB — PHOSPHORUS: Phosphorus: 4.9 mg/dL — ABNORMAL HIGH (ref 2.5–4.6)

## 2018-04-03 LAB — HEPARIN INDUCED PLATELET AB (HIT ANTIBODY): Heparin Induced Plt Ab: 0.34 OD (ref 0.000–0.400)

## 2018-04-03 LAB — MAGNESIUM: Magnesium: 2.3 mg/dL (ref 1.7–2.4)

## 2018-04-03 NOTE — Progress Notes (Signed)
SLP Cancellation Note  Patient Details Name: Kimberly Montgomery MRN: [id number] DOB: 1965-12-15   Cancelled treatment:       Reason Eval/Treat Not Completed: Patient at procedure or test/unavailable.  Chart reviewed. Yesterday, nursing reported pt had not had anything to eat due to lethargy and AMS and requested SLP to re-evaluate swallow due to decline in status. This was attempted x2, first attempt at approximately 1400, pt was observed to be markedly agitated, yelling out and loudly moaning, decided to re-attempt later this afternoon at 16:15 in hopes pt would be less agitated and more appropriate for PO trials; however, pt was off floor for MRI. The patient's diet was downgraded of diet to Dysphagia II due to concerns with swallow safety due to lethargy and confusion. Continue to recommend: Nursing feed pt ONLY IF ALERT, Feed slowly, small bites and sips, STOP FEEDING WITH ANY S/S ASPIRATION. SLP to reattempt re-assessment of swallow tomorrow.     Leandrew Koyanagi 04/03/2018, 2:50 PM

## 2018-04-03 NOTE — Progress Notes (Signed)
Post HD Assessment    04/03/18 1715  Neurological  Level of Consciousness Alert  Orientation Level Disoriented X4  Respiratory  Respiratory Pattern Regular;Unlabored  Chest Assessment Chest expansion symmetrical  Bilateral Breath Sounds Diminished;Clear  Cough None  Cardiac  Pulse Regular  Heart Sounds S1, S2  ECG Monitor Yes  Cardiac Rhythm NSR  Vascular  R Radial Pulse +2  L Radial Pulse +2  Edema Generalized  Psychosocial  Psychosocial (WDL) X  Patient Behaviors Agitated;Irritable  Needs Expressed Physical  Emotional support given Given to patient

## 2018-04-03 NOTE — Progress Notes (Signed)
[facility] [place] [facility]   PATIENT NAME: Kimberly Montgomery    MR#:  [id number]  DATE OF BIRTH:  06/13/1965  SUBJECTIVE:  This morning patient noted to be more lethargic.  Intermittently arousable.  No fevers.  Intermittently yelling.  Updated patient's daughter who was present at bedside.  Daughter confirmed patient has been intermittently confused with recurrent treatment for UTIs for the last couple of months.  Patient currently not at baseline.  REVIEW OF SYSTEMS:    Unable to obtain as patient is confused  A DRUG ALLERGIES:   Allergies  Allergen Reactions  . Cephalosporins Anaphylaxis    Patient has tolerated meropenem.   [name] Penicillins Anaphylaxis and Other (See Comments)    Has patient had a PCN reaction causing immediate rash, facial/tongue/throat swelling, SOB or lightheadedness with hypotension: Yes Has patient had a PCN reaction causing severe rash involving mucus membranes or skin necrosis: No Has patient had a PCN reaction that required hospitalization No Has patient had a PCN reaction occurring within the last 10 years: No If all of the above answers are "NO", then may proceed with Cephalosporin use.  . Reglan [Metoclopramide] Other (See Comments)    Severe EPS (lip, head, body tremors) March 2018  . Risperdal [Risperidone] Other (See Comments)    Severe EPS (lip, head, body tremors) March 2018  . Lamictal [Lamotrigine] Other (See Comments)    Reaction:  Hallucinations  . Phenergan [Promethazine Hcl] Nausea And Vomiting    Sensitivity to medicine  . Sulfamethoxazole-Trimethoprim     Other reaction(s): hyperkalemia  . Sulfasalazine Other (See Comments)    Reaction:  Unknown   . Pravastatin Other (See Comments)    Reaction:  Muscle pain   . Sulfa Antibiotics Other (See Comments)    Reaction:  Unknown     VITALS:  Blood pressure (!) 108/57, pulse 63, temperature 98.4 F (36.9 C), temperature source Oral, resp. rate 18, height 5\' 9"  (1.753  m), weight 122.5 kg, last menstrual period 03/16/2015, SpO2 100 %.  PHYSICAL EXAMINATION:  Constitutional: Appears well-developed and well-nourished. No distress.  More confused .[name] HENT: Normocephalic. [name] Oropharynx is clear and moist.  Eyes: Conjunctivae and EOM are normal. PERRLA, no scleral icterus.  Neck: Normal ROM. Neck supple. No JVD. No tracheal deviation. CVS: RRR, S1/S2 +, no murmurs, no gallops, no carotid bruit.  Pulmonary: Effort and breath sounds normal, no stridor, rhonchi, wheezes, rales.  Abdominal: Soft. BS +,  no distension, tenderness, rebound or guarding.  Musculoskeletal: Patient status post right below-knee amputation previously. Neuro: Alert. . No focal deficits. Skin: Skin is warm and dry. No rash noted. Psychiatric: Confused .     LABORATORY PANEL:   CBC Recent Labs  Lab 04/03/18 0927  WBC 11.1*  HGB 12.0  HCT 40.1  PLT 67*   ------------------------------------------------------------------------------------------------------------------  Chemistries  Recent Labs  Lab 03/30/18 0828  04/02/18 0542 04/03/18 0927  NA 137   < > 139  --   K 4.8   < > 5.3*  --   CL 101   < > 102  --   CO2 24   < > 23  --   GLUCOSE 110*   < > 165*  --   BUN 29*   < > 17  --   CREATININE 5.57*   < > 4.10*  --   CALCIUM 8.1*   < > 8.5*  --   MG  --    < > 2.3 2.3  AST 21  --   --   --   ALT 6  --   --   --   ALKPHOS 142*  --   --   --   BILITOT 0.8  --   --   --    < > = values in this interval not displayed.   ------------------------------------------------------------------------------------------------------------------  Cardiac Enzymes No results for input(s): TROPONINI in the last 168 hours. ------------------------------------------------------------------------------------------------------------------  RADIOLOGY:  Ct Abdomen Pelvis Wo Contrast  Result Date: 04/02/2018 CLINICAL DATA:  Generalize acute abdominal pain. EXAM: CT ABDOMEN AND PELVIS  WITHOUT CONTRAST TECHNIQUE: Multidetector CT imaging of the abdomen and pelvis was performed following the standard protocol without IV contrast. COMPARISON:  CT scan 09/06/2017 FINDINGS: Lower chest: There is a moderate-sized right pleural effusion with moderate overlying atelectasis. Very small left pleural effusion with overlying atelectasis. The heart is enlarged but appears stable. Stable advanced three-vessel coronary artery calcifications. No pericardial effusion. Hepatobiliary: No focal hepatic lesions or intrahepatic biliary dilatation. The gallbladder contains small gallstones and probable gallbladder sludge. No gallbladder wall thickening or pericholecystic fluid to suggest acute cholecystitis. No common bile duct dilatation. Pancreas: Stable advanced pancreatic atrophy but no mass, inflammation or ductal dilatation. Spleen: Borderline in size but stable. Adrenals/Urinary Tract: The adrenal glands and kidneys are stable. Extensive renal artery vascular calcifications. No worrisome renal lesions or hydronephrosis. The bladder demonstrates moderate wall thickening. Could not exclude cystitis. No bladder calculi or mass. No ureteral calculi or dilatation. Stomach/Bowel: The stomach, duodenum, small bowel and colon are grossly normal without oral contrast. No acute inflammatory changes, mass lesions or obstructive findings. The terminal ileum and appendix appear normal. Moderate diffuse rectal wall thickening appears stable since prior CT scans. Vascular/Lymphatic: Advanced vascular calcifications but no aneurysm. Numerous scattered mesenteric and retroperitoneal lymph nodes are stable. Reproductive: The uterus and ovaries are unremarkable. Other: Mesenteric edema, small amount of abdominal ascites and fairly massive subcutaneous edema suggesting anasarca. Musculoskeletal: No significant bony findings. Prominent Schmorl's nodes are noted in the spine. IMPRESSION: 1. Moderate-sized right pleural effusion with  overlying atelectasis. 2. Mesenteric edema, small volume ascites and fairly massive body wall edema suggesting anasarca. 3. Cholelithiasis and probable gallbladder sludge but no definite CT findings for acute cholecystitis. 4. Mild diffuse bladder wall thickening could suggest cystitis. 5. Advanced vascular disease. 6. Diffuse rectal wall thickening appears stable since multiple prior CT scans and is of uncertain significance or etiology. Electronically Signed   By: Rudie Meyer M.D.   On: 04/02/2018 10:45   Mr Brain Wo Contrast  Result Date: 04/02/2018 CLINICAL DATA:  Altered mental status. Unable to give history. Slurred speech. EXAM: MRI HEAD WITHOUT CONTRAST TECHNIQUE: Multiplanar, multiecho pulse sequences of the brain and surrounding structures were obtained without intravenous contrast. COMPARISON:  CT head 03/30/2018.  MR head 11/24/2017. FINDINGS: The patient was unable to remain motionless for the exam. Small or subtle lesions could be overlooked. Some series could not be completed due to lack of patient cooperation. Brain: No evidence for acute infarction, hemorrhage, mass lesion, hydrocephalus, or extra-axial fluid. Premature for age atrophy. Mild T2 and FLAIR hyperintensities in the white matter, likely chronic microvascular ischemic change. Vascular: Flow voids appear maintained. Skull and upper cervical spine: Not assessed. Sinuses/Orbits: No significant sinus fluid accumulation. BILATERAL cataract extraction but no orbital findings of significance. Other: No definite mastoid fluid. IMPRESSION: Motion degraded exam is suboptimal but overall diagnostic. Atrophy with small vessel disease.  No acute intracranial findings. Electronically Signed   By:  Elsie Stain M.D.   On: 04/02/2018 17:13     ASSESSMENT AND PLAN:  53 year old female with end-stage renal disease on hemodialysis, seizure disorder and chronic diastolic heart failure with preserved ejection fraction who presented to the ER due to  altered mental status.  1.  Acute encephalopathy in the setting of hypercarbia urinary tract infection:  Patient  still lethargic.  There was recent concern for abdominal pains.  Patient had CT abdomen and pelvis without contrast yesterday which revealed moderate sized right pleural effusion with evidence of anasarca.  There was evidence of cholelithiasis with no definite CT evidence of acute cholecystitis.  Patient noted to have had a recent abdominal ultrasound with no evidence of acute cholecystitis Recent CT head without contrast already done with no acute findings.  Continue treatment of urinary tract infection as outlined below. MRI of the brain without contrast said to be suboptimal but overall diagnostic.  No acute intracranial findings. Recently evaluated by speech therapist and currently on dysphagia 2diet with thin liquids. Patient appears to have been having intermittent admissions for confusion related to UTIs. I discussed case with neurologist.  Concern for possible developing dementia.  They will see patient in consultation.  Follow-up on recommendations.  2.  Acute hypoxic sick/hypercarbic respiratory failure: Patient has been weaned from BiPAP to baseline nasal cannula. Follow-up ABG improved  3.  End-stage renal disease:  Nephrology service following.  Had hemodialysis today.  Continue inpatient hemodialysis  4.  UTI: Currently on IV meropenem.  Noted elevated lactic acid level yesterday. Initial concern for possible developing sepsis.  Placed on gentle IV fluid hydration at 50 cc an hour.  Repeat lactic acid level trended down.  Urine culture growing enterococcus raffinosus sensitive to vancomycin.  Vancomycin added for gram-positive coverage.  Nephrologist planning on continuing vancomycin during hemodialysis post discharge from the hospital to complete about 2 weeks treatment duration. Patient appears to have evidence of fluid overload as evidenced on CT abdomen and pelvis with  evidence of anasarca.  CT abdomen and pelvis showed moderate sized right pleural effusion with overlying atelectasis.  Mesenteric edema.  Small volume ascites with massive body wall edema suggestive of anasarca.  IV fluids previously discontinued.  For pulling out more fluids during hemodialysis today.  5  History of seizure disorder: Continue Keppra and Trileptal  6.  Psych issues: Continue Abilify, Cogentin, Lexapro  7.  CAD: Continue Plavix, isosorbide  DVT prophylaxis; heparin  Management plans discussed with nursing at bedside.  Updated daughter present at bedside on treatment plans as outlined above.   CODE STATUS: FULL  TOTAL TIME TAKING CARE OF THIS PATIENT: 36 minutes.     POSSIBLE D/C 2 days, DEPENDING ON CLINICAL CONDITION.   Lysette Lindenbaum M.D on 04/03/2018 at 4:34 PM  7 AM to 6 PM;[phone]  After 6pm go to [email or web address] - password [email or web address]  [facility]  Office  [phone]  CC: Primary care physician; Dorothey Baseman, MD  Note: This dictation was prepared with Dragon dictation along with smaller phrase technology. Any transcriptional errors that result from this process are unintentional.  Notified by pharmacy staff patient already on IV meropenem with renal dosing.  No indication to add ciprofloxacin.  Continue IV meropenem to complete treatment duration pending results of culture and sensitivities.

## 2018-04-03 NOTE — Progress Notes (Signed)
HD Tx completed    04/03/18 1645  Vital Signs  Pulse Rate 73  Pulse Rate Source Monitor  Resp (!) 21  BP (!) 129/58  BP Location Left Arm  BP Method Automatic  Patient Position (if appropriate) Lying  Oxygen Therapy  SpO2 100 %  O2 Device Nasal Cannula  O2 Flow Rate (L/min) 3 L/min  Pulse Oximetry Type Continuous  During Hemodialysis Assessment  HD Safety Checks Performed Yes  KECN 64.1 KECN  Dialysis Fluid Bolus Normal Saline  Bolus Amount (mL) 250 mL  Intra-Hemodialysis Comments Tx completed;Tolerated well

## 2018-04-03 NOTE — Progress Notes (Signed)
Pre HD Assessment    04/03/18 1310  Neurological  Level of Consciousness Alert  Orientation Level Disoriented X4  Respiratory  Respiratory Pattern Regular;Unlabored  Chest Assessment Chest expansion symmetrical  Bilateral Breath Sounds Diminished  Cough None  Cardiac  Pulse Regular  Heart Sounds S1, S2  ECG Monitor Yes  Cardiac Rhythm NSR  Vascular  R Radial Pulse +2  L Radial Pulse +2  Edema Generalized  Psychosocial  Psychosocial (WDL) X  Patient Behaviors Agitated;Irritable  Needs Expressed Physical  Emotional support given Given to patient

## 2018-04-03 NOTE — Progress Notes (Signed)
Post HD Tx    04/03/18 1652  Hand-Off documentation  Report given to (Full Name) Orvan Falconer, RN   Report received from (Full Name) Laretta Alstrom, RN   Vital Signs  Temp 98.4 F (36.9 C)  Temp Source Oral  Pulse Rate 73  Pulse Rate Source Monitor  Resp (!) 29  BP (!) 148/57  BP Location Left Arm  BP Method Automatic  Patient Position (if appropriate) Lying  Oxygen Therapy  SpO2 100 %  O2 Device Nasal Cannula  O2 Flow Rate (L/min) 3 L/min  Pulse Oximetry Type Continuous  Pain Assessment  Pain Scale PAINAD  Pain Score 0  Post-Hemodialysis Assessment  Rinseback Volume (mL) 250 mL  KECN 64.1 V  Dialyzer Clearance Lightly streaked  Duration of HD Treatment -hour(s) 3.5 hour(s)  Hemodialysis Intake (mL) 500 mL  UF Total -Machine (mL) 3042 mL  Net UF (mL) 2542 mL  Tolerated HD Treatment Yes  AVG/AVF Arterial Site Held (minutes) 10 minutes  AVG/AVF Venous Site Held (minutes) 10 minutes  Fistula / Graft Right Upper arm Arteriovenous fistula  No Placement Date or Time found.   Placed prior to admission: Yes  Orientation: Right  Access Location: Upper arm  Access Type: Arteriovenous fistula  Site Condition No complications  Fistula / Graft Assessment Present;Thrill;Bruit  Status Deaccessed  Drainage Description None

## 2018-04-03 NOTE — Progress Notes (Signed)
Assumed care of patient. Pt is in currently in HD.

## 2018-04-03 NOTE — Progress Notes (Signed)
HD Tx started   04/03/18 1323  Hand-Off documentation  Report given to (Full Name) Laretta Alstrom, RN   Report received from (Full Name) Isidore Moos, RN   Vital Signs  Temp 98.4 F (36.9 C)  Temp Source Oral  Pulse Rate 71  Pulse Rate Source Monitor  Resp 19  BP 124/62  BP Location Left Arm  BP Method Automatic  Patient Position (if appropriate) Lying  Oxygen Therapy  SpO2 100 %  O2 Device Nasal Cannula  O2 Flow Rate (L/min) 3 L/min  Pulse Oximetry Type Continuous  Pain Assessment  Pain Scale PAINAD  Pain Score 0  Time-Out for Hemodialysis  What Procedure? HD  Pt Identifiers(min of two) First/Last Name;MRN/Account#  Correct Site? Yes  Correct Side? Yes  Correct Procedure? Yes  Consents Verified? Yes  Rad Studies Available? N/A  Safety Precautions Reviewed? Yes  Biochemist, clinical Number 7  Station Number 4  UF/Alarm Test Passed  Conductivity: Meter 14  Conductivity: Machine  13.8  pH 7.4  Reverse Osmosis Main  Normal Saline Lot Number [id number]  Dialyzer Lot Number [id number]  Disposable Set Lot Number [id number]  Machine Temperature 98.6 F (37 C)  Immunologist and Audible Yes  Blood Lines Intact and Secured Yes  Pre Treatment Patient Checks  Vascular access used during treatment Fistula  Hepatitis B Surface Antigen Results Negative  Date Hepatitis B Surface Antigen Drawn 01/12/18  Hepatitis B Surface Antibody 11  Date Hepatitis B Surface Antibody Drawn 01/12/18  Hemodialysis Consent Verified Yes  Hemodialysis Standing Orders Initiated Yes  ECG (Telemetry) Monitor On Yes  Prime Ordered Normal Saline  Length of  DialysisTreatment -hour(s) 3.5 Hour(s)  Dialysis Treatment Comments [id number]  Dialyzer Elisio 17H NR  Dialysis Anticoagulant None  Dialysate Flow Ordered 600  Blood Flow Rate Ordered 400 mL/min  Ultrafiltration Goal 3 Liters  Dialysis Blood Pressure Support Ordered Normal Saline  During Hemodialysis Assessment  Blood Flow Rate (mL/min) 400  mL/min  Arterial Pressure (mmHg) -160 mmHg  Venous Pressure (mmHg) 180 mmHg  Transmembrane Pressure (mmHg) 60 mmHg  Ultrafiltration Rate (mL/min) 1000 mL/min  Dialysate Flow Rate (mL/min) 600 ml/min  Conductivity: Machine  14.3  HD Safety Checks Performed Yes  Dialysis Fluid Bolus Normal Saline  Bolus Amount (mL) 250 mL  Intra-Hemodialysis Comments Tx initiated  Education / Care Plan  Dialysis Education Provided No (Comment)  Documented Education in Care Plan  (Pt is confused )  Fistula / Graft Right Upper arm Arteriovenous fistula  No Placement Date or Time found.   Placed prior to admission: Yes  Orientation: Right  Access Location: Upper arm  Access Type: Arteriovenous fistula  Site Condition No complications  Fistula / Graft Assessment Present;Thrill;Bruit  Status Accessed  Needle Size 15g  Drainage Description None

## 2018-04-03 NOTE — Progress Notes (Signed)
[facility]  ROUNDING NOTE   Subjective:   Confused and not following commands. Levetiracetam and aripiprazole continued. Lorazepam given yesterday.   Objective:  Vital signs in last 24 hours:  Temp:  [98.6 F (37 C)-100 F (37.8 C)] 98.6 F (37 C) (01/03 0623) Pulse Rate:  [66-90] 90 (01/03 0623) Resp:  [18-24] 24 (01/03 0623) BP: (102-116)/(56-57) 107/56 (01/03 0623) SpO2:  [100 %] 100 % (01/03 0829) FiO2 (%):  [28 %] 28 % (01/02 2017)  Weight change:  Filed Weights   03/30/18 2338 04/01/18 1027 04/01/18 1406  Weight: 105.4 kg 123.8 kg 122.5 kg    Intake/Output: I/O last 3 completed shifts: In: 706.9 [P.O.:500; I.V.:102.8; IV Piggyback:104.2] Out: 0    Intake/Output this shift:  Total I/O In: 5 [P.O.:5] Out: -   Physical Exam: General: NAD  Head: [place]/AT  Eyes: Anicteric, PERRL  Neck: Supple, trachea midline  Lungs:  Clear to auscultation, 2L [place]   Heart: Regular rate and rhythm  Abdomen:  Soft, nontender, obese  Extremities: Right BKA  Neurologic: somnolent  Skin: No lesions  Access: Right AVF    Basic Metabolic Panel: Recent Labs  Lab 03/30/18 0828 03/31/18 0335 04/01/18 0457 04/02/18 0542 04/03/18 0927  NA 137 138 137 139  --   K 4.8 3.1* 3.5 5.3*  --   CL 101 98 98 102  --   CO2 24 30 31 23   --   GLUCOSE 110* 73 103* 165*  --   BUN 29* 16 22* 17  --   CREATININE 5.57* 3.71* 4.82* 4.10*  --   CALCIUM 8.1* 8.2* 8.2* 8.5*  --   MG  --   --   --  2.3 2.3  PHOS  --   --   --  4.7* 4.9*    Liver Function Tests: Recent Labs  Lab 03/30/18 0828  AST 21  ALT 6  ALKPHOS 142*  BILITOT 0.8  PROT 6.6  ALBUMIN 3.3*   No results for input(s): LIPASE, AMYLASE in the last 168 hours. Recent Labs  Lab 03/30/18 0958  AMMONIA 28    CBC: Recent Labs  Lab 03/30/18 0828 03/31/18 0335 04/01/18 0941 04/02/18 0542 04/03/18 0927  WBC 7.3 5.0 5.6 14.1* 11.1*  HGB 12.3 11.3* 11.5* 12.4 12.0  HCT 42.4 38.4 39.4 43.4 40.1  MCV 97.9  94.8 97.0 98.4 95.9  PLT 183 152 135* 74* 67*    Cardiac Enzymes: No results for input(s): CKTOTAL, CKMB, CKMBINDEX, TROPONINI in the last 168 hours.  BNP: Invalid input(s): POCBNP  CBG: Recent Labs  Lab 04/02/18 1155 04/02/18 1358 04/02/18 1709 04/02/18 2155 04/03/18 0750  GLUCAP 150* 133* 117* 84 119*    Microbiology: Results for orders placed or performed during the hospital encounter of 03/30/18  Urine Culture     Status: Abnormal   Collection Time: 03/30/18 10:14 AM  Result Value Ref Range Status   Specimen Description   Final    URINE, CLEAN CATCH Performed at [facility], [address] [place], [place] [address]    Special Requests   Final    Normal Performed at [facility], [address] [place], [place] [address]    Culture >=100,000 COLONIES/mL ENTEROCOCCUS RAFFINOSUS (A)  Final   Report Status 04/02/2018 FINAL  Final   Organism ID, Bacteria ENTEROCOCCUS RAFFINOSUS (A)  Final      Susceptibility   Enterococcus raffinosus - MIC*    AMPICILLIN 8 SENSITIVE Sensitive  LEVOFLOXACIN >=8 RESISTANT Resistant     NITROFURANTOIN 32 SENSITIVE Sensitive     VANCOMYCIN <=0.5 SENSITIVE Sensitive     * >=100,000 COLONIES/mL ENTEROCOCCUS RAFFINOSUS  MRSA PCR Screening     Status: Abnormal   Collection Time: 03/30/18 11:47 AM  Result Value Ref Range Status   MRSA by PCR POSITIVE (A) NEGATIVE Final    Comment:        The GeneXpert MRSA Assay (FDA approved for NASAL specimens only), is one component of a comprehensive MRSA colonization surveillance program. It is not intended to diagnose MRSA infection nor to guide or monitor treatment for MRSA infections. RESULT CALLED TO, READ BACK BY AND VERIFIED WITHBrynda Greathouse AT 1317 03/30/18 SDR Performed at [facility], [address] [place], [place] [address]     Coagulation Studies: No results for input(s): LABPROT, INR in the last 72 hours.  Urinalysis: No results for  input(s): COLORURINE, LABSPEC, PHURINE, GLUCOSEU, HGBUR, BILIRUBINUR, KETONESUR, PROTEINUR, UROBILINOGEN, NITRITE, LEUKOCYTESUR in the last 72 hours.  Invalid input(s): APPERANCEUR    Imaging: Ct Abdomen Pelvis Wo Contrast  Result Date: 04/02/2018 CLINICAL DATA:  Generalize acute abdominal pain. EXAM: CT ABDOMEN AND PELVIS WITHOUT CONTRAST TECHNIQUE: Multidetector CT imaging of the abdomen and pelvis was performed following the standard protocol without IV contrast. COMPARISON:  CT scan 09/06/2017 FINDINGS: Lower chest: There is a moderate-sized right pleural effusion with moderate overlying atelectasis. Very small left pleural effusion with overlying atelectasis. The heart is enlarged but appears stable. Stable advanced three-vessel coronary artery calcifications. No pericardial effusion. Hepatobiliary: No focal hepatic lesions or intrahepatic biliary dilatation. The gallbladder contains small gallstones and probable gallbladder sludge. No gallbladder wall thickening or pericholecystic fluid to suggest acute cholecystitis. No common bile duct dilatation. Pancreas: Stable advanced pancreatic atrophy but no mass, inflammation or ductal dilatation. Spleen: Borderline in size but stable. Adrenals/Urinary Tract: The adrenal glands and kidneys are stable. Extensive renal artery vascular calcifications. No worrisome renal lesions or hydronephrosis. The bladder demonstrates moderate wall thickening. Could not exclude cystitis. No bladder calculi or mass. No ureteral calculi or dilatation. Stomach/Bowel: The stomach, duodenum, small bowel and colon are grossly normal without oral contrast. No acute inflammatory changes, mass lesions or obstructive findings. The terminal ileum and appendix appear normal. Moderate diffuse rectal wall thickening appears stable since prior CT scans. Vascular/Lymphatic: Advanced vascular calcifications but no aneurysm. Numerous scattered mesenteric and retroperitoneal lymph nodes are  stable. Reproductive: The uterus and ovaries are unremarkable. Other: Mesenteric edema, small amount of abdominal ascites and fairly massive subcutaneous edema suggesting anasarca. Musculoskeletal: No significant bony findings. Prominent Schmorl's nodes are noted in the spine. IMPRESSION: 1. Moderate-sized right pleural effusion with overlying atelectasis. 2. Mesenteric edema, small volume ascites and fairly massive body wall edema suggesting anasarca. 3. Cholelithiasis and probable gallbladder sludge but no definite CT findings for acute cholecystitis. 4. Mild diffuse bladder wall thickening could suggest cystitis. 5. Advanced vascular disease. 6. Diffuse rectal wall thickening appears stable since multiple prior CT scans and is of uncertain significance or etiology. Electronically Signed   By: Rudie Meyer M.D.   On: 04/02/2018 10:45   Mr Brain Wo Contrast  Result Date: 04/02/2018 CLINICAL DATA:  Altered mental status. Unable to give history. Slurred speech. EXAM: MRI HEAD WITHOUT CONTRAST TECHNIQUE: Multiplanar, multiecho pulse sequences of the brain and surrounding structures were obtained without intravenous contrast. COMPARISON:  CT head 03/30/2018.  MR head 11/24/2017. FINDINGS: The patient was unable to remain motionless for the exam.  Small or subtle lesions could be overlooked. Some series could not be completed due to lack of patient cooperation. Brain: No evidence for acute infarction, hemorrhage, mass lesion, hydrocephalus, or extra-axial fluid. Premature for age atrophy. Mild T2 and FLAIR hyperintensities in the white matter, likely chronic microvascular ischemic change. Vascular: Flow voids appear maintained. Skull and upper cervical spine: Not assessed. Sinuses/Orbits: No significant sinus fluid accumulation. BILATERAL cataract extraction but no orbital findings of significance. Other: No definite mastoid fluid. IMPRESSION: Motion degraded exam is suboptimal but overall diagnostic. Atrophy with  small vessel disease.  No acute intracranial findings. Electronically Signed   By: Elsie Stain M.D.   On: 04/02/2018 17:13     Medications:   . meropenem (MERREM) IV Stopped (04/03/18 0745)  . vancomycin     . ARIPiprazole  2.5 mg Oral BID  . ascorbic acid  500 mg Intravenous BID  . aspirin  300 mg Rectal Daily  . benztropine  0.5 mg Oral BID  . budesonide  0.5 mg Nebulization BID  . chlorhexidine  15 mL Mouth Rinse BID  . Chlorhexidine Gluconate Cloth  6 each Topical Q0600  . clopidogrel  75 mg Oral Q1200  . escitalopram  5 mg Oral Q2000  . feeding supplement (NEPRO CARB STEADY)  237 mL Oral TID BM  . fluticasone  1 spray Each Nare Q1200  . insulin aspart  0-5 Units Subcutaneous QHS  . insulin aspart  0-9 Units Subcutaneous TID WC  . isosorbide mononitrate  90 mg Oral Q1200  . lactulose  10 g Oral q1800  . levETIRAcetam  1,000 mg Oral QHS  . lidocaine  1 patch Transdermal QHS  . lidocaine  1 application Topical Q M,W,F-HD  . mouth rinse  15 mL Mouth Rinse q12n4p  . Melatonin  2.5 mg Oral Q2000  . multivitamin  1 tablet Oral QHS  . mupirocin ointment  1 application Nasal BID  . OXcarbazepine  150 mg Oral QHS  . pantoprazole  40 mg Oral BID  . patiromer  16.8 g Oral Once  . polyvinyl alcohol  1 drop Both Eyes TID  . tiotropium  1 capsule Inhalation Q1200   acetaminophen, acetaminophen, albuterol, bisacodyl, morphine injection, nitroGLYCERIN, ondansetron **OR** ondansetron (ZOFRAN) IV  Assessment/ Plan:  Kimberly Montgomery is a 53 y.o. white female with end stage renal disease on hemodialysis, COPD, diabetes mellitus type II, hypertension, congestive heart failure, coronary artery disease, CVA, seizure disorder, depression/bipolar disorder.   [facility] MWF [address] Right AVF 106kg.   1. End Stage Renal Disease: hemodialysis treatment for later today. Aggressive ultrafiltration due to anasarca on examination.   2. Hypertension: Outpatient regimen of  metoprolol, lisinopril, torsemide, amlodipine, hydralazine and isosorbide mononitrate.  Blood pressure at goal Currently only on isosorbide mononitrate.   3. Anemia of chronic kidney disease:   - no indication for EPO.   4. Secondary Hyperparathyroidism:  - sevelamer with meals. When able to take PO  5. Altered mental status: similar presentation in November 2019. Was secondary to urinary tract infection. UA consistent with infection.  Also with hypercapnea on admission - placed on BIPAP on admission.  MRI negative  6. Urinary Tract Infection: Enterococcus species.  - antibiotics   LOS: 4 Kimberly Montgomery 1/3/202011:53 AM

## 2018-04-04 LAB — BASIC METABOLIC PANEL
Anion gap: 8 (ref 5–15)
BUN: 25 mg/dL — ABNORMAL HIGH (ref 6–20)
CO2: 30 mmol/L (ref 22–32)
Calcium: 8.2 mg/dL — ABNORMAL LOW (ref 8.9–10.3)
Chloride: 103 mmol/L (ref 98–111)
Creatinine, Ser: 4.1 mg/dL — ABNORMAL HIGH (ref 0.44–1.00)
GFR calc Af Amer: 13 mL/min — ABNORMAL LOW (ref >60)
GFR calc non Af Amer: 12 mL/min — ABNORMAL LOW (ref >60)
Glucose, Bld: 102 mg/dL — ABNORMAL HIGH (ref 70–99)
Potassium: 3.9 mmol/L (ref 3.5–5.1)
Sodium: 141 mmol/L (ref 135–145)

## 2018-04-04 LAB — CBC
HCT: 39.2 % (ref 36.0–46.0)
HEMOGLOBIN: 11.6 g/dL — AB (ref 12.0–15.0)
MCH: 28 pg (ref 26.0–34.0)
MCHC: 29.6 g/dL — ABNORMAL LOW (ref 30.0–36.0)
MCV: 94.7 fL (ref 80.0–100.0)
NRBC: 0 % (ref 0.0–0.2)
Platelets: 81 10*3/uL — ABNORMAL LOW (ref 150–400)
RBC: 4.14 MIL/uL (ref 3.87–5.11)
RDW: 17.1 % — AB (ref 11.5–15.5)
WBC: 6.9 10*3/uL (ref 4.0–10.5)

## 2018-04-04 LAB — GLUCOSE, CAPILLARY
Glucose-Capillary: 102 mg/dL — ABNORMAL HIGH (ref 70–99)
Glucose-Capillary: 121 mg/dL — ABNORMAL HIGH (ref 70–99)
Glucose-Capillary: 99 mg/dL (ref 70–99)
Glucose-Capillary: 121 mg/dL — ABNORMAL HIGH (ref 70–99)

## 2018-04-04 LAB — PHOSPHORUS: PHOSPHORUS: 3.7 mg/dL (ref 2.5–4.6)

## 2018-04-04 LAB — MAGNESIUM: Magnesium: 2.3 mg/dL (ref 1.7–2.4)

## 2018-04-04 NOTE — Progress Notes (Signed)
[facility] [place] [facility]   PATIENT NAME: Kimberly Montgomery    MR#:  [id number]  DATE OF BIRTH:  [birth year]  SUBJECTIVE:  Patient moaning this morning.  Hardly arousable.  REVIEW OF SYSTEMS:    Unable to obtain as patient is confused  A DRUG ALLERGIES:   Allergies  Allergen Reactions  . Cephalosporins Anaphylaxis    Patient has tolerated meropenem.   [name] Penicillins Anaphylaxis and Other (See Comments)    Has patient had a PCN reaction causing immediate rash, facial/tongue/throat swelling, SOB or lightheadedness with hypotension: Yes Has patient had a PCN reaction causing severe rash involving mucus membranes or skin necrosis: No Has patient had a PCN reaction that required hospitalization No Has patient had a PCN reaction occurring within the last 10 years: No If all of the above answers are "NO", then may proceed with Cephalosporin use.  . Reglan [Metoclopramide] Other (See Comments)    Severe EPS (lip, head, body tremors) March 2018  . Risperdal [Risperidone] Other (See Comments)    Severe EPS (lip, head, body tremors) March 2018  . Lamictal [Lamotrigine] Other (See Comments)    Reaction:  Hallucinations  . Phenergan [Promethazine Hcl] Nausea And Vomiting    Sensitivity to medicine  . Sulfamethoxazole-Trimethoprim     Other reaction(s): hyperkalemia  . Sulfasalazine Other (See Comments)    Reaction:  Unknown   . Pravastatin Other (See Comments)    Reaction:  Muscle pain   . Sulfa Antibiotics Other (See Comments)    Reaction:  Unknown     VITALS:  Blood pressure (!) 137/55, pulse 66, temperature 98.9 F (37.2 C), temperature source Oral, resp. rate 20, height 5\' 9"  (1.753 m), weight 122.5 kg, last menstrual period 03/16/2015, SpO2 99 %.  PHYSICAL EXAMINATION:  Constitutional: Appears confused and moaning critically ill more confused .[name] HENT: Normocephalic. [name] Oropharynx is clear and moist.  Eyes: Conjunctivae are normal. no scleral icterus.   Neck: Normal ROM. Neck supple. No JVD. No tracheal deviation. CVS: RRR, S1/S2 +, no murmurs, no gallops, no carotid bruit.  Pulmonary: Effort and breath sounds normal, no stridor, rhonchi, wheezes, rales.  Abdominal: Soft. BS +,  no distension, tenderness, rebound or guarding.  Musculoskeletal: Patient status post right below-knee amputation previously. Neuro: Moaning lethargic. [name] No focal deficits. Skin: Skin is warm and dry. No rash noted. Psychiatric: Confused moaning    LABORATORY PANEL:   CBC Recent Labs  Lab 04/04/18 0505  WBC 6.9  HGB 11.6*  HCT 39.2  PLT 81*   ------------------------------------------------------------------------------------------------------------------  Chemistries  Recent Labs  Lab 03/30/18 0828  04/04/18 0505  NA 137   < > 141  K 4.8   < > 3.9  CL 101   < > 103  CO2 24   < > 30  GLUCOSE 110*   < > 102*  BUN 29*   < > 25*  CREATININE 5.57*   < > 4.10*  CALCIUM 8.1*   < > 8.2*  MG  --    < > 2.3  AST 21  --   --   ALT 6  --   --   ALKPHOS 142*  --   --   BILITOT 0.8  --   --    < > = values in this interval not displayed.   ------------------------------------------------------------------------------------------------------------------  Cardiac Enzymes No results for input(s): TROPONINI in the last 168 hours. ------------------------------------------------------------------------------------------------------------------  RADIOLOGY:  Mr Brain Wo Contrast  Result Date: 04/02/2018 CLINICAL  DATA:  Altered mental status. Unable to give history. Slurred speech. EXAM: MRI HEAD WITHOUT CONTRAST TECHNIQUE: Multiplanar, multiecho pulse sequences of the brain and surrounding structures were obtained without intravenous contrast. COMPARISON:  CT head 03/30/2018.  MR head 11/24/2017. FINDINGS: The patient was unable to remain motionless for the exam. Small or subtle lesions could be overlooked. Some series could not be completed due to lack of  patient cooperation. Brain: No evidence for acute infarction, hemorrhage, mass lesion, hydrocephalus, or extra-axial fluid. Premature for age atrophy. Mild T2 and FLAIR hyperintensities in the white matter, likely chronic microvascular ischemic change. Vascular: Flow voids appear maintained. Skull and upper cervical spine: Not assessed. Sinuses/Orbits: No significant sinus fluid accumulation. BILATERAL cataract extraction but no orbital findings of significance. Other: No definite mastoid fluid. IMPRESSION: Motion degraded exam is suboptimal but overall diagnostic. Atrophy with small vessel disease.  No acute intracranial findings. Electronically Signed   By: Elsie Stain M.D.   On: 04/02/2018 17:13     ASSESSMENT AND PLAN:  53 year old female with end-stage renal disease on hemodialysis, seizure disorder and chronic diastolic heart failure with preserved ejection fraction who presented to the ER due to altered mental status.  1.  Acute encephalopathy in the setting of hypercarbia and enterococcus urinary tract infection with the possibility of underlying dementia:  Patient remains confused and lethargic  Neurology consultation pending  Overall prognosis poor  MRI of the brain without acute intracranial finding  2.  Acute hypoxic sick/hypercarbic respiratory failure: Patient has been weaned from BiPAP to baseline nasal cannula.    3.  End-stage renal disease:  Continue dialysis as per recommendations by nephrology.  4.  Enterococcus UTI: This is sensitive to vancomycin.  Patient can have vancomycin dosing during dialysis.  5  History of seizure disorder: Continue Keppra and Trileptal I will check Keppra level Neurology consultation pending 6.  Psych issues: Consider holding Abilify, Cogentin, Lexapro  7.  CAD: Continue Plavix, isosorbide  8.  Hypertension: Continue isosorbide  Overall prognosis poor.  Discussed case with Dr. Wynelle Link. Consider palliative care consultation after being  evaluated neurology.  DVT prophylaxis; heparin  Management plans discussed with nursing.  CODE STATUS: FULL  TOTAL TIME TAKING CARE OF THIS PATIENT: 24 minutes.     POSSIBLE D/C ??, DEPENDING ON CLINICAL CONDITION.   Cato Liburd M.D on 04/04/2018 at 12:05 PM  7 AM to 6 PM;[id number]  After 6pm go to [email or web address] - password [email or web address]  [facility]  Office  [phone]  CC: Primary care physician; Dorothey Baseman, MD  Note: This dictation was prepared with Dragon dictation along with smaller phrase technology. Any transcriptional errors that result from this process are unintentional.  Notified by pharmacy staff patient already on IV meropenem with renal dosing.  No indication to add ciprofloxacin.  Continue IV meropenem to complete treatment duration pending results of culture and sensitivities.

## 2018-04-04 NOTE — Consult Note (Signed)
Pharmacy Antibiotic Note  Kimberly Montgomery is a 53 y.o. female admitted on 03/30/2018 with AMS and a UTI.  This morning she has met the criteria for sepsis. Pharmacy has been consulted for vancomycin dosing.  Plan: Vancomycin 2000 mg IV once then 1000 mg IV with every MWF HD session.  Goal pre-HD level 15-25 mcg/mL. A level will be drawn prior to the 3rd HD session.  Height: _0  (175.3 cm) Weight: 270 lb (122.5 kg) IBW/kg (Calculated) : 66.2  Temp (24hrs), Avg:99 F (37.2 C), Min:98.4 F (36.9 C), Max:99.9 F (37.7 C)  Recent Labs  Lab 03/30/18 0828 03/31/18 0335 04/01/18 0457 04/01/18 0941 04/02/18 0542 04/02/18 0725 04/02/18 1054 04/03/18 0927 04/04/18 0505  WBC 7.3 5.0  --  5.6 14.1*  --   --  11.1* 6.9  CREATININE 5.57* 3.71* 4.82*  --  4.10*  --   --   --  4.10*  LATICACIDVEN  --   --   --   --   --  2.8* 2.1*  --   --     Estimated Creatinine Clearance: 22 mL/min (A) (by C-G formula based on SCr of 4.1 mg/dL (H)).    Allergies  Allergen Reactions  . Cephalosporins Anaphylaxis    Patient has tolerated meropenem.   [name] Penicillins Anaphylaxis and Other (See Comments)    Has patient had a PCN reaction causing immediate rash, facial/tongue/throat swelling, SOB or lightheadedness with hypotension: Yes Has patient had a PCN reaction causing severe rash involving mucus membranes or skin necrosis: No Has patient had a PCN reaction that required hospitalization No Has patient had a PCN reaction occurring within the last 10 years: No If all of the above answers are "NO", then may proceed with Cephalosporin use.  . Reglan [Metoclopramide] Other (See Comments)    Severe EPS (lip, head, body tremors) March 2018  . Risperdal [Risperidone] Other (See Comments)    Severe EPS (lip, head, body tremors) March 2018  . Lamictal [Lamotrigine] Other (See Comments)    Reaction:  Hallucinations  . Phenergan [Promethazine Hcl] Nausea And Vomiting    Sensitivity to medicine  .  Sulfamethoxazole-Trimethoprim     Other reaction(s): hyperkalemia  . Sulfasalazine Other (See Comments)    Reaction:  Unknown   . Pravastatin Other (See Comments)    Reaction:  Muscle pain   . Sulfa Antibiotics Other (See Comments)    Reaction:  Unknown     Antimicrobials this admission: meropenem 12/31 >> 1/4 vancomycin 1/2 >>   Microbiology results: 12/30 UCx: E raffinosus R to Levaquin only  12/30 MRSA PCR: positive  Thank you for allowing pharmacy to be a part of this patient's care.  Simpson,Basnett L 04/04/2018 2:53 PM

## 2018-04-04 NOTE — Progress Notes (Signed)
   04/04/18 1605  Clinical Encounter Type  Visited With Family;Patient  Visit Type Initial  Referral From Nurse  Spiritual Encounters  Spiritual Needs Emotional;Prayer   Received page from staff regarding support for patient's daughter Nira Conn, chaplain informed staff she would be there after escorting a family to ICU. Chaplain met with patient's daughter Nira Conn in the hallway.  Daughter expressed visible emotion regarding recent conversation regarding need to consider comfort care for mother.  Chaplain utilized active and reflective listening as daughter processed impact of father's death about 5 years ago and her role as caregiver for mother.  Conversation surrounding patient's wishes and changes in her health.  Patient began calling out so chaplain and daughter moved into patient's room, offering emotional support and engagement to patient.  Chaplain prayed with patient and daughter.  Patient expressed feeling tired.  Daughter indicated she will page chaplain as needed.

## 2018-04-04 NOTE — Progress Notes (Signed)
[facility]  ROUNDING NOTE   Subjective:   Moaning and confused this morning. Hemodialysis treatment yesterday. Tolerated treatment well. UF 2.5 liters  Objective:  Vital signs in last 24 hours:  Temp:  [98.4 F (36.9 C)-99.9 F (37.7 C)] 98.6 F (37 C) (01/04 1208) Pulse Rate:  [60-73] 60 (01/04 1208) Resp:  [14-30] 16 (01/04 1208) BP: (98-148)/(35-62) 143/60 (01/04 1208) SpO2:  [99 %-100 %] 99 % (01/04 1208)  Weight change:  Filed Weights   03/30/18 2338 04/01/18 1027 04/01/18 1406  Weight: 105.4 kg 123.8 kg 122.5 kg    Intake/Output: I/O last 3 completed shifts: In: 205 [P.O.:5; IV Piggyback:200] Out: 2542 [Other:2542]   Intake/Output this shift:  No intake/output data recorded.  Physical Exam: General: NAD  Head: [place]/AT  Eyes: Anicteric, PERRL  Neck: Supple, trachea midline  Lungs:  Clear to auscultation, 2L [place]   Heart: Regular rate and rhythm  Abdomen:  Soft, nontender, obese  Extremities: Right BKA  Neurologic: somnolent  Skin: No lesions  Access: Right AVF    Basic Metabolic Panel: Recent Labs  Lab 03/30/18 0828 03/31/18 0335 04/01/18 0457 04/02/18 0542 04/03/18 0927 04/04/18 0505  NA 137 138 137 139  --  141  K 4.8 3.1* 3.5 5.3*  --  3.9  CL 101 98 98 102  --  103  CO2 24 30 31 23   --  30  GLUCOSE 110* 73 103* 165*  --  102*  BUN 29* 16 22* 17  --  25*  CREATININE 5.57* 3.71* 4.82* 4.10*  --  4.10*  CALCIUM 8.1* 8.2* 8.2* 8.5*  --  8.2*  MG  --   --   --  2.3 2.3 2.3  PHOS  --   --   --  4.7* 4.9* 3.7    Liver Function Tests: Recent Labs  Lab 03/30/18 0828  AST 21  ALT 6  ALKPHOS 142*  BILITOT 0.8  PROT 6.6  ALBUMIN 3.3*   No results for input(s): LIPASE, AMYLASE in the last 168 hours. Recent Labs  Lab 03/30/18 0958  AMMONIA 28    CBC: Recent Labs  Lab 03/31/18 0335 04/01/18 0941 04/02/18 0542 04/03/18 0927 04/04/18 0505  WBC 5.0 5.6 14.1* 11.1* 6.9  HGB 11.3* 11.5* 12.4 12.0 11.6*  HCT 38.4 39.4 43.4  40.1 39.2  MCV 94.8 97.0 98.4 95.9 94.7  PLT 152 135* 74* 67* 81*    Cardiac Enzymes: No results for input(s): CKTOTAL, CKMB, CKMBINDEX, TROPONINI in the last 168 hours.  BNP: Invalid input(s): POCBNP  CBG: Recent Labs  Lab 04/03/18 1216 04/03/18 1739 04/03/18 2123 04/04/18 0745 04/04/18 1209  GLUCAP 113* 105* 94 102* 121*    Microbiology: Results for orders placed or performed during the hospital encounter of 03/30/18  Urine Culture     Status: Abnormal   Collection Time: 03/30/18 10:14 AM  Result Value Ref Range Status   Specimen Description   Final    URINE, CLEAN CATCH Performed at [facility], [address] [place], [place] [address]    Special Requests   Final    Normal Performed at [facility], [address] [place], [place] [address]    Culture >=100,000 COLONIES/mL ENTEROCOCCUS RAFFINOSUS (A)  Final   Report Status 04/02/2018 FINAL  Final   Organism ID, Bacteria ENTEROCOCCUS RAFFINOSUS (A)  Final      Susceptibility   Enterococcus raffinosus - MIC*    AMPICILLIN 8 SENSITIVE Sensitive  LEVOFLOXACIN >=8 RESISTANT Resistant     NITROFURANTOIN 32 SENSITIVE Sensitive     VANCOMYCIN <=0.5 SENSITIVE Sensitive     * >=100,000 COLONIES/mL ENTEROCOCCUS RAFFINOSUS  MRSA PCR Screening     Status: Abnormal   Collection Time: 03/30/18 11:47 AM  Result Value Ref Range Status   MRSA by PCR POSITIVE (A) NEGATIVE Final    Comment:        The GeneXpert MRSA Assay (FDA approved for NASAL specimens only), is one component of a comprehensive MRSA colonization surveillance program. It is not intended to diagnose MRSA infection nor to guide or monitor treatment for MRSA infections. RESULT CALLED TO, READ BACK BY AND VERIFIED WITHBrynda Greathouse AT 1317 03/30/18 SDR Performed at [facility], [address] [place], [place] [address]     Coagulation Studies: No results for input(s): LABPROT, INR in the last 72  hours.  Urinalysis: No results for input(s): COLORURINE, LABSPEC, PHURINE, GLUCOSEU, HGBUR, BILIRUBINUR, KETONESUR, PROTEINUR, UROBILINOGEN, NITRITE, LEUKOCYTESUR in the last 72 hours.  Invalid input(s): APPERANCEUR    Imaging: Mr Brain Wo Contrast  Result Date: 04/02/2018 CLINICAL DATA:  Altered mental status. Unable to give history. Slurred speech. EXAM: MRI HEAD WITHOUT CONTRAST TECHNIQUE: Multiplanar, multiecho pulse sequences of the brain and surrounding structures were obtained without intravenous contrast. COMPARISON:  CT head 03/30/2018.  MR head 11/24/2017. FINDINGS: The patient was unable to remain motionless for the exam. Small or subtle lesions could be overlooked. Some series could not be completed due to lack of patient cooperation. Brain: No evidence for acute infarction, hemorrhage, mass lesion, hydrocephalus, or extra-axial fluid. Premature for age atrophy. Mild T2 and FLAIR hyperintensities in the white matter, likely chronic microvascular ischemic change. Vascular: Flow voids appear maintained. Skull and upper cervical spine: Not assessed. Sinuses/Orbits: No significant sinus fluid accumulation. BILATERAL cataract extraction but no orbital findings of significance. Other: No definite mastoid fluid. IMPRESSION: Motion degraded exam is suboptimal but overall diagnostic. Atrophy with small vessel disease.  No acute intracranial findings. Electronically Signed   By: Elsie Stain M.D.   On: 04/02/2018 17:13     Medications:   . vancomycin     . ARIPiprazole  2.5 mg Oral BID  . ascorbic acid  500 mg Intravenous BID  . aspirin  300 mg Rectal Daily  . benztropine  0.5 mg Oral BID  . budesonide  0.5 mg Nebulization BID  . chlorhexidine  15 mL Mouth Rinse BID  . Chlorhexidine Gluconate Cloth  6 each Topical Q0600  . clopidogrel  75 mg Oral Q1200  . escitalopram  5 mg Oral Q2000  . feeding supplement (NEPRO CARB STEADY)  237 mL Oral TID BM  . fluticasone  1 spray Each Nare Q1200   . insulin aspart  0-5 Units Subcutaneous QHS  . insulin aspart  0-9 Units Subcutaneous TID WC  . isosorbide mononitrate  90 mg Oral Q1200  . lactulose  10 g Oral q1800  . levETIRAcetam  1,000 mg Oral QHS  . lidocaine  1 patch Transdermal QHS  . lidocaine  1 application Topical Q M,W,F-HD  . mouth rinse  15 mL Mouth Rinse q12n4p  . Melatonin  2.5 mg Oral Q2000  . multivitamin  1 tablet Oral QHS  . mupirocin ointment  1 application Nasal BID  . OXcarbazepine  150 mg Oral QHS  . pantoprazole  40 mg Oral BID  . patiromer  16.8 g Oral Once  . polyvinyl alcohol  1 drop Both  Eyes TID  . tiotropium  1 capsule Inhalation Q1200   acetaminophen, acetaminophen, albuterol, bisacodyl, morphine injection, nitroGLYCERIN, ondansetron **OR** ondansetron (ZOFRAN) IV  Assessment/ Plan:  Kimberly Montgomery is a 53 y.o. white female with end stage renal disease on hemodialysis, COPD, diabetes mellitus type II, hypertension, congestive heart failure, coronary artery disease, CVA, seizure disorder, depression/bipolar disorder.   [facility] [address] Right AVF 106kg.   1. End Stage Renal Disease: hemodialysis MWF schedule.   2. Hypertension: Outpatient regimen of metoprolol, lisinopril, torsemide, amlodipine, hydralazine and isosorbide mononitrate.  Blood pressure at goal  3. Anemia of chronic kidney disease:   - no indication for EPO.   4. Secondary Hyperparathyroidism:  - sevelamer with meals. When able to take PO  5. Altered mental status: similar presentation in November 2019. Was secondary to urinary tract infection. UA consistent with infection.  Also with hypercapnea on admission - placed on BIPAP on admission.  MRI negative  6. Urinary Tract Infection: Enterococcus species.  - antibiotics   LOS: 5 Kimberly Montgomery 1/4/20201:35 PM

## 2018-04-04 NOTE — Progress Notes (Signed)
PT Cancellation Note  Patient Details Name: Kimberly Montgomery MRN: [id number] DOB: 30-Aug-1965   Cancelled Treatment:    Reason Eval/Treat Not Completed: Fatigue/lethargy limiting ability to participate;Patient's level of consciousness Will evaluate at a later date when patient appropriate, discussed with RN.    Jefry Lesinski 04/04/2018, 11:09 AM

## 2018-04-05 LAB — GLUCOSE, CAPILLARY
Glucose-Capillary: 135 mg/dL — ABNORMAL HIGH (ref 70–99)
Glucose-Capillary: 147 mg/dL — ABNORMAL HIGH (ref 70–99)
Glucose-Capillary: 138 mg/dL — ABNORMAL HIGH (ref 70–99)
Glucose-Capillary: 136 mg/dL — ABNORMAL HIGH (ref 70–99)

## 2018-04-05 NOTE — Progress Notes (Signed)
PT Cancellation Note  Patient Details Name: Kimberly Montgomery MRN: [id number] DOB: [birth year]   Cancelled Treatment:    Reason Eval/Treat Not Completed: Fatigue/lethargy limiting ability to participate;Patient's level of consciousness. PT discussed with RN regarding mobility evaluation. Patient is currently moaning in bed and unable to participate in mobility evaluation. RN asked PT to hold mobility evaluation at this time and return at a later time/date when patient is more able to participate in mobility evaluation.   Alva Garnet PT, DPT, CSCS    04/05/2018, 10:23 AM

## 2018-04-05 NOTE — Progress Notes (Signed)
[facility]  ROUNDING NOTE   Subjective:   Long meeting with daughter this morning over patient's plan of care.  Patient is able to intermittently answer some questions. Continues to complain of pain. Confused.   Objective:  Vital signs in last 24 hours:  Temp:  [98.1 F (36.7 C)-98.5 F (36.9 C)] 98.3 F (36.8 C) (01/05 1100) Pulse Rate:  [62-67] 66 (01/05 1100) Resp:  [16-20] 18 (01/05 1100) BP: (138-149)/(55-63) 142/59 (01/05 1100) SpO2:  [97 %-100 %] 97 % (01/05 1100)  Weight change:  Filed Weights   03/30/18 2338 04/01/18 1027 04/01/18 1406  Weight: 105.4 kg 123.8 kg 122.5 kg    Intake/Output: I/O last 3 completed shifts: In: 200 [IV Piggyback:200] Out: 0    Intake/Output this shift:  Total I/O In: 50 [P.O.:50] Out: 0   Physical Exam: General: NAD  Head: [place]/AT  Eyes: Anicteric, PERRL  Neck: Supple, trachea midline  Lungs:  Clear to auscultation, 2L [place]   Heart: Regular rate and rhythm  Abdomen:  Soft, nontender, obese  Extremities: Right BKA  Neurologic: Alert to self intermittently. Follows simple commands  Skin: No lesions  Access: Right AVF    Basic Metabolic Panel: Recent Labs  Lab 03/30/18 0828 03/31/18 0335 04/01/18 0457 04/02/18 0542 04/03/18 0927 04/04/18 0505  NA 137 138 137 139  --  141  K 4.8 3.1* 3.5 5.3*  --  3.9  CL 101 98 98 102  --  103  CO2 24 30 31 23   --  30  GLUCOSE 110* 73 103* 165*  --  102*  BUN 29* 16 22* 17  --  25*  CREATININE 5.57* 3.71* 4.82* 4.10*  --  4.10*  CALCIUM 8.1* 8.2* 8.2* 8.5*  --  8.2*  MG  --   --   --  2.3 2.3 2.3  PHOS  --   --   --  4.7* 4.9* 3.7    Liver Function Tests: Recent Labs  Lab 03/30/18 0828  AST 21  ALT 6  ALKPHOS 142*  BILITOT 0.8  PROT 6.6  ALBUMIN 3.3*   No results for input(s): LIPASE, AMYLASE in the last 168 hours. Recent Labs  Lab 03/30/18 0958  AMMONIA 28    CBC: Recent Labs  Lab 03/31/18 0335 04/01/18 0941 04/02/18 0542 04/03/18 0927  04/04/18 0505  WBC 5.0 5.6 14.1* 11.1* 6.9  HGB 11.3* 11.5* 12.4 12.0 11.6*  HCT 38.4 39.4 43.4 40.1 39.2  MCV 94.8 97.0 98.4 95.9 94.7  PLT 152 135* 74* 67* 81*    Cardiac Enzymes: No results for input(s): CKTOTAL, CKMB, CKMBINDEX, TROPONINI in the last 168 hours.  BNP: Invalid input(s): POCBNP  CBG: Recent Labs  Lab 04/04/18 1209 04/04/18 1633 04/04/18 2113 04/05/18 0740 04/05/18 1111  GLUCAP 121* 99 121* 135* 147*    Microbiology: Results for orders placed or performed during the hospital encounter of 03/30/18  Urine Culture     Status: Abnormal   Collection Time: 03/30/18 10:14 AM  Result Value Ref Range Status   Specimen Description   Final    URINE, CLEAN CATCH Performed at [facility], [address] [place] [address] [place], [place] [address]    Special Requests   Final    Normal Performed at [facility], [address] [place], [place] [address]    Culture >=100,000 COLONIES/mL ENTEROCOCCUS RAFFINOSUS (A)  Final   Report Status 04/02/2018 FINAL  Final   Organism ID, Bacteria ENTEROCOCCUS RAFFINOSUS (A)  Final  Susceptibility   Enterococcus raffinosus - MIC*    AMPICILLIN 8 SENSITIVE Sensitive     LEVOFLOXACIN >=8 RESISTANT Resistant     NITROFURANTOIN 32 SENSITIVE Sensitive     VANCOMYCIN <=0.5 SENSITIVE Sensitive     * >=100,000 COLONIES/mL ENTEROCOCCUS RAFFINOSUS  MRSA PCR Screening     Status: Abnormal   Collection Time: 03/30/18 11:47 AM  Result Value Ref Range Status   MRSA by PCR POSITIVE (A) NEGATIVE Final    Comment:        The GeneXpert MRSA Assay (FDA approved for NASAL specimens only), is one component of a comprehensive MRSA colonization surveillance program. It is not intended to diagnose MRSA infection nor to guide or monitor treatment for MRSA infections. RESULT CALLED TO, READ BACK BY AND VERIFIED WITHBrynda Greathouse AT 1317 03/30/18 SDR Performed at [facility], [address] [place], [place]  [address]     Coagulation Studies: No results for input(s): LABPROT, INR in the last 72 hours.  Urinalysis: No results for input(s): COLORURINE, LABSPEC, PHURINE, GLUCOSEU, HGBUR, BILIRUBINUR, KETONESUR, PROTEINUR, UROBILINOGEN, NITRITE, LEUKOCYTESUR in the last 72 hours.  Invalid input(s): APPERANCEUR    Imaging: No results found.   Medications:   . vancomycin     . ascorbic acid  500 mg Intravenous BID  . aspirin  300 mg Rectal Daily  . budesonide  0.5 mg Nebulization BID  . chlorhexidine  15 mL Mouth Rinse BID  . Chlorhexidine Gluconate Cloth  6 each Topical Q0600  . clopidogrel  75 mg Oral Q1200  . feeding supplement (NEPRO CARB STEADY)  237 mL Oral TID BM  . fluticasone  1 spray Each Nare Q1200  . insulin aspart  0-5 Units Subcutaneous QHS  . insulin aspart  0-9 Units Subcutaneous TID WC  . lactulose  10 g Oral q1800  . levETIRAcetam  1,000 mg Oral QHS  . lidocaine  1 patch Transdermal QHS  . lidocaine  1 application Topical Q M,W,F-HD  . mouth rinse  15 mL Mouth Rinse q12n4p  . Melatonin  2.5 mg Oral Q2000  . multivitamin  1 tablet Oral QHS  . OXcarbazepine  150 mg Oral QHS  . pantoprazole  40 mg Oral BID  . patiromer  16.8 g Oral Once  . polyvinyl alcohol  1 drop Both Eyes TID  . tiotropium  1 capsule Inhalation Q1200   acetaminophen, acetaminophen, albuterol, bisacodyl, morphine injection, nitroGLYCERIN, ondansetron **OR** ondansetron (ZOFRAN) IV  Assessment/ Plan:  [name] is a 53 y.o. white female with end stage renal disease on hemodialysis, COPD, diabetes mellitus type II, hypertension, congestive heart failure, coronary artery disease, CVA, seizure disorder, depression/bipolar disorder.   Hospital course: admitted on 03/30/2018 for altered mental status. Thought to have a urinary tract infection. Placed on broad spectrum antibiotics. Patient's mental status worsened over the last few days. However family states that it is improving today.   [facility]  Nephrology MWF [address] Right AVF 106kg.   1. End Stage Renal Disease: hemodialysis MWF schedule. Dialysis for tomorrow.   2. Hypertension: Currently off all agents.  Outpatient regimen of metoprolol, lisinopril, torsemide, amlodipine, hydralazine and isosorbide mononitrate.  Blood pressure at goal  3. Anemia of chronic kidney disease:   - no indication for EPO.   4. Secondary Hyperparathyroidism:  - sevelamer with meals. When able to take PO  5. Altered mental status: similar presentation in November 2019. Was secondary to urinary tract infection. UA consistent with  infection.  Also with hypercapnea on admission - placed on BIPAP on admission.  MRI negative Neurology input appreciated.   6. Urinary Tract Infection: Enterococcus species.  - IV vancomycin 1 gram until 04/15/18.    LOS: 6 Darely Becknell 1/5/20202:03 PM

## 2018-04-05 NOTE — Progress Notes (Signed)
[facility] [place] [facility]   PATIENT NAME: Kimberly Montgomery    MR#:  [id number]  DATE OF BIRTH:  [birth year]  SUBJECTIVE:  Patient with lethargy this morning.  Not following any commands.  REVIEW OF SYSTEMS:    Unable to obtain as patient is confused  A DRUG ALLERGIES:   Allergies  Allergen Reactions  . Cephalosporins Anaphylaxis    Patient has tolerated meropenem.   [name] Penicillins Anaphylaxis and Other (See Comments)    Has patient had a PCN reaction causing immediate rash, facial/tongue/throat swelling, SOB or lightheadedness with hypotension: Yes Has patient had a PCN reaction causing severe rash involving mucus membranes or skin necrosis: No Has patient had a PCN reaction that required hospitalization No Has patient had a PCN reaction occurring within the last 10 years: No If all of the above answers are "NO", then may proceed with Cephalosporin use.  . Reglan [Metoclopramide] Other (See Comments)    Severe EPS (lip, head, body tremors) March 2018  . Risperdal [Risperidone] Other (See Comments)    Severe EPS (lip, head, body tremors) March 2018  . Lamictal [Lamotrigine] Other (See Comments)    Reaction:  Hallucinations  . Phenergan [Promethazine Hcl] Nausea And Vomiting    Sensitivity to medicine  . Sulfamethoxazole-Trimethoprim     Other reaction(s): hyperkalemia  . Sulfasalazine Other (See Comments)    Reaction:  Unknown   . Pravastatin Other (See Comments)    Reaction:  Muscle pain   . Sulfa Antibiotics Other (See Comments)    Reaction:  Unknown     VITALS:  Blood pressure (!) 149/63, pulse 67, temperature 98.5 F (36.9 C), temperature source Oral, resp. rate 16, height 5\' 9"  (1.753 m), weight 122.5 kg, last menstrual period 03/16/2015, SpO2 100 %.  PHYSICAL EXAMINATION:  Constitutional: Appears lethargic critically ill  HENT: Normocephalic. [name] Oropharynx is clear and moist.  Eyes: Conjunctivae are normal. no scleral icterus.  Neck: Normal  ROM. Neck supple. No JVD. No tracheal deviation. CVS: RRR, S1/S2 +, no murmurs, no gallops, no carotid bruit.  Pulmonary: Effort and breath sounds normal, no stridor, rhonchi, wheezes, rales.  Abdominal: Soft. BS +,  no distension, tenderness, rebound or guarding.  Musculoskeletal: Patient status post right below-knee amputation previously. Neuro:  lethargic. [name] No focal deficits. Skin: Skin is warm and dry. No rash noted. Psychiatric: Lethargic   LABORATORY PANEL:   CBC Recent Labs  Lab 04/04/18 0505  WBC 6.9  HGB 11.6*  HCT 39.2  PLT 81*   ------------------------------------------------------------------------------------------------------------------  Chemistries  Recent Labs  Lab 03/30/18 0828  04/04/18 0505  NA 137   < > 141  K 4.8   < > 3.9  CL 101   < > 103  CO2 24   < > 30  GLUCOSE 110*   < > 102*  BUN 29*   < > 25*  CREATININE 5.57*   < > 4.10*  CALCIUM 8.1*   < > 8.2*  MG  --    < > 2.3  AST 21  --   --   ALT 6  --   --   ALKPHOS 142*  --   --   BILITOT 0.8  --   --    < > = values in this interval not displayed.   ------------------------------------------------------------------------------------------------------------------  Cardiac Enzymes No results for input(s): TROPONINI in the last 168 hours. ------------------------------------------------------------------------------------------------------------------  RADIOLOGY:  No results found.   ASSESSMENT AND PLAN:  53 year old female with  end-stage renal disease on hemodialysis, seizure disorder and chronic diastolic heart failure with preserved ejection fraction who presented to the ER due to altered mental status.  1.  Acute encephalopathy in the setting of hypercarbia and enterococcus urinary tract infection with the possibility of underlying dementia:  Patient remains confused and lethargic  Neurology consultation pending  Overall prognosis poor  MRI of the brain without acute intracranial  finding A lot of care consultation placed and discussed with Dr. Wynelle Link.   2.  Acute hypoxic hypoxic/hypercarbic respiratory failure: Patient has been weaned from BiPAP to baseline nasal cannula.    3.  End-stage renal disease:  Continue dialysis as per recommendations by nephrology.  4.  Enterococcus UTI: This is sensitive to vancomycin.  Patient can have vancomycin dosing during dialysis.  5  History of seizure disorder: Continue Keppra and Trileptal Keppra level pending    6.  Psych issues: Hold Abilify, Cogentin, Lexapro due to encephalopathy  7.  CAD: Continue Plavix, isosorbide  8.  Hypertension: Continue isosorbide  Overall prognosis poor.  Discussed case with Dr. Wynelle Link. palliative care consultation placed. As per Dr. Wynelle Link patient's family may be comfortable with starting comfort care.     CODE STATUS: FULL  TOTAL TIME TAKING CARE OF THIS PATIENT: 21 minutes.     POSSIBLE D/C ??, DEPENDING ON CLINICAL CONDITION.   Jalan Fariss M.D on 04/05/2018 at 10:52 AM  7 AM to 6 PM;[phone]  After 6pm go to [email or web address] - password [email or web address]  [facility]  Office  [phone]  CC: Primary care physician; Dorothey Baseman, MD  Note: This dictation was prepared with Dragon dictation along with smaller phrase technology. Any transcriptional errors that result from this process are unintentional.  Notified by pharmacy staff patient already on IV meropenem with renal dosing.  No indication to add ciprofloxacin.  Continue IV meropenem to complete treatment duration pending results of culture and sensitivities.

## 2018-04-05 NOTE — Clinical Social Work Note (Signed)
Clinical Social Work Assessment  Patient Details  Name: Kimberly Montgomery MRN: [id number] Date of Birth: 16-Apr-1965  Date of referral:  04/05/18               Reason for consult:  Facility Placement                Permission sought to share information with:  Facility Industrial/product designer granted to share information::  Yes, Verbal Permission Granted  Name::        Agency::  [facility]  Relationship::     Contact Information:     Housing/Transportation Living arrangements for the past 2 months:  Skilled Building surveyor of Information:  Medical Team, Adult Children, Facility Patient Interpreter Needed:  None Criminal Activity/Legal Involvement Pertinent to Current Situation/Hospitalization:  No - Comment as needed Significant Relationships:  Adult Children, Merchandiser, retail Lives with:  Facility Resident Do you feel safe going back to the place where you live?  Yes Need for family participation in patient care:  Yes (Comment)(Patient is disoriented X4)  Care giving concerns: Patient admitted from [facility]   Social Worker assessment / plan:  CSW contacted the patient's daughter to discuss discharge planning. The patient's daughter shared that she had a meeting with the patient's nephrologist about beginning palliative care discussions as her mother has shown increased symptoms and may not return to her baseline. [name] reported that if she decides to continue full code and/or dialysis for her mother after the meeting with palliative care, the plan is for return to [facility]. However, [name] shared that she is leaning toward discontinuation of dialysis as her mother does not seem to have the quality of life that she would want and the prevailing medical opinion is that she will continue to decline. The CSW provided emotional support and answered questions that [name] had about palliative care and hospice care.   The CSW will continue to follow as the family makes the  decision about pursuing aggressive care vs comfort care.  Employment status:  Disabled (Comment on whether or not currently receiving Disability) Insurance information:  Teacher, English as a foreign language, Medicaid In [place] PT Recommendations:  Not assessed at this time Information / Referral to community resources:     Patient/Family's Response to care:  [name] thanked the CSW for concern and listening.  Patient/Family's Understanding of and Emotional Response to Diagnosis, Current Treatment, and Prognosis:  The patient's daughter seems to understand her mother's diagnosis and is responding appropriately.  Emotional Assessment Appearance:  Appears older than stated age Attitude/Demeanor/Rapport:  Lethargic Affect (typically observed):  Withdrawn Orientation:  Oriented to Self Alcohol / Substance use:  Never Used Psych involvement (Current and /or in the community):  No (Comment)  Discharge Needs  Concerns to be addressed:  Discharge Planning Concerns, Grief and Loss Concerns Readmission within the last 30 days:  No Current discharge risk:  Chronically ill, Physical Impairment, Psychiatric Illness Barriers to Discharge:  Continued Medical Work up, Other(Palliative care meeting)   Judi Cong, LCSW 04/05/2018, 3:25 PM

## 2018-04-06 DIAGNOSIS — Z7189 Other specified counseling: Secondary | ICD-10-CM

## 2018-04-06 DIAGNOSIS — G9340 Encephalopathy, unspecified: Secondary | ICD-10-CM

## 2018-04-06 DIAGNOSIS — Z515 Encounter for palliative care: Secondary | ICD-10-CM

## 2018-04-06 LAB — GLUCOSE, CAPILLARY: Glucose-Capillary: 97 mg/dL (ref 70–99)

## 2018-04-06 MED ORDER — POLYVINYL ALCOHOL 1.4 % OP SOLN: 1.0000 [drp] | Freq: Four times a day (QID) | OPHTHALMIC | Status: DC | PRN

## 2018-04-06 MED ORDER — GLYCOPYRROLATE 1 MG PO TABS
1.0000 mg | ORAL_TABLET | ORAL | Status: DC | PRN
  Filled 2018-04-06: qty 1

## 2018-04-06 MED ORDER — VITAMIN C 500 MG PO TABS
500.0000 mg | ORAL_TABLET | Freq: Two times a day (BID) | ORAL | Status: DC
  Filled 2018-04-06: qty 1

## 2018-04-06 MED ORDER — ACETAMINOPHEN 650 MG RE SUPP: 650.0000 mg | Freq: Four times a day (QID) | RECTAL | Status: DC | PRN

## 2018-04-06 MED ORDER — HALOPERIDOL 0.5 MG PO TABS
0.5000 mg | ORAL_TABLET | ORAL | Status: DC | PRN
  Filled 2018-04-06: qty 1

## 2018-04-06 MED ORDER — ONDANSETRON 4 MG PO TBDP: 4.0000 mg | ORAL_TABLET | Freq: Four times a day (QID) | ORAL | Status: DC | PRN

## 2018-04-06 MED ORDER — ACETAMINOPHEN 325 MG PO TABS: 650.0000 mg | ORAL_TABLET | Freq: Four times a day (QID) | ORAL | Status: DC | PRN

## 2018-04-06 MED ORDER — GLYCOPYRROLATE 0.2 MG/ML IJ SOLN
0.2000 mg | INTRAMUSCULAR | Status: DC | PRN
  Filled 2018-04-06: qty 1

## 2018-04-06 MED ORDER — HALOPERIDOL LACTATE 5 MG/ML IJ SOLN
0.5000 mg | INTRAMUSCULAR | Status: DC | PRN
  Filled 2018-04-06: qty 0.1

## 2018-04-06 MED ORDER — HYDROCODONE-ACETAMINOPHEN 7.5-325 MG PO TABS
1.0000 | ORAL_TABLET | Freq: Four times a day (QID) | ORAL | Status: DC | PRN
  Administered 2018-04-06 – 2018-04-07 (×3): 1 via ORAL
  Filled 2018-04-06 (×3): qty 1

## 2018-04-06 MED ORDER — HALOPERIDOL LACTATE 2 MG/ML PO CONC
0.5000 mg | ORAL | Status: DC | PRN
  Filled 2018-04-06: qty 0.3

## 2018-04-06 MED ORDER — BIOTENE DRY MOUTH MT LIQD: 15.0000 mL | OROMUCOSAL | Status: DC | PRN

## 2018-04-06 MED ORDER — ONDANSETRON HCL 4 MG/2ML IJ SOLN: 4.0000 mg | Freq: Four times a day (QID) | INTRAMUSCULAR | Status: DC | PRN

## 2018-04-06 NOTE — Progress Notes (Signed)
HD Treatment Complete    04/06/18 1430  Vital Signs  Pulse Rate 62  Pulse Rate Source Monitor  Resp 18  BP (!) 178/60  BP Location Left Arm  BP Method Automatic  Patient Position (if appropriate) Lying  Oxygen Therapy  SpO2 100 %  O2 Device Nasal Cannula  O2 Flow Rate (L/min) 2 L/min  During Hemodialysis Assessment  Blood Flow Rate (mL/min) 400 mL/min  Arterial Pressure (mmHg) -170 mmHg  Venous Pressure (mmHg) 170 mmHg  Transmembrane Pressure (mmHg) 50 mmHg  Ultrafiltration Rate (mL/min) 600 mL/min  Dialysate Flow Rate (mL/min) 600 ml/min  Conductivity: Machine  14  HD Safety Checks Performed Yes  Intra-Hemodialysis Comments Progressing as prescribed;Tolerated well;Tx completed (HD Treatment Complete; UF 2750)  Fistula / Graft Right Upper arm Arteriovenous fistula  No Placement Date or Time found.   Placed prior to admission: Yes  Orientation: Right  Access Location: Upper arm  Access Type: Arteriovenous fistula  Status Deaccessed

## 2018-04-06 NOTE — Progress Notes (Signed)
Post HD Assessment    04/06/18 1445  Neurological  Level of Consciousness Alert  Orientation Level Oriented to person;Disoriented to place;Disoriented to time;Disoriented to situation  Respiratory  Respiratory Pattern Regular;Unlabored;Symmetrical  Chest Assessment Chest expansion symmetrical  Bilateral Breath Sounds Diminished  Cardiac  Pulse Regular  Heart Sounds S1, S2  Jugular Venous Distention (JVD) No  ECG Monitor Yes  Cardiac Rhythm SB  Antiarrhythmic device No  Vascular  R Radial Pulse +2  L Radial Pulse +2  R Dorsalis Pedis Pulse Other (Comment)  L Dorsalis Pedis Pulse +2  Edema Right lower extremity;Left lower extremity  Generalized Edema +1  RLE Edema +1  Integumentary  Integumentary (WDL) X  Skin Color Appropriate for ethnicity  Skin Condition Dry  Skin Integrity Amputation;Ecchymosis;Abrasion  Ecchymosis Location Back;Leg;Shoulder  Ecchymosis Location Orientation Right;Left  Musculoskeletal  Musculoskeletal (WDL) X  Generalized Weakness Yes  Gastrointestinal  Bowel Sounds Assessment Active  GU Assessment  Genitourinary (WDL) X (HD pt)  Genitourinary Symptoms Anuria  Psychosocial  Psychosocial (WDL) WDL  Patient Behaviors Appropriate for situation;Cooperative;Calm  Needs Expressed Physical  Emotional support given Given to patient

## 2018-04-06 NOTE — Plan of Care (Signed)
  Problem: Education: Goal: Knowledge of disease or condition will improve Outcome: Progressing Goal: Understanding of medication regimen will improve Outcome: Progressing Goal: Individualized Educational Video(s) Outcome: Progressing   Problem: Activity: Goal: Ability to tolerate increased activity will improve Outcome: Progressing   Problem: Cardiac: Goal: Ability to achieve and maintain adequate cardiopulmonary perfusion will improve Outcome: Progressing   Problem: Health Behavior/Discharge Planning: Goal: Ability to safely manage health-related needs after discharge will improve Outcome: Progressing   Problem: Pain Managment: Goal: General experience of comfort will improve Outcome: Progressing PRN pain medication given.

## 2018-04-06 NOTE — Progress Notes (Signed)
Pre HD and HD Treatment Initiated     04/06/18 1047  Vital Signs  Temp 98.4 F (36.9 C)  Temp Source Oral  Pulse Rate (!) 54  Pulse Rate Source Monitor  Resp 18  BP (!) 126/55  BP Location Left Arm  BP Method Manual  Patient Position (if appropriate) Lying  Oxygen Therapy  SpO2 99 %  O2 Device Nasal Cannula  O2 Flow Rate (L/min) 2 L/min  Pain Assessment  Pain Scale 0-10  Pain Score Asleep (Pt arousable and gestures)  Dialysis Weight  Weight 125.2 kg  Type of Weight Pre-Dialysis  Time-Out for Hemodialysis  What Procedure? HD  Pt Identifiers(min of two) First/Last Name;MRN/Account#;Pt's DOB(use if MRN/Acct# not available  Correct Site? Yes  Correct Side? Yes  Correct Procedure? Yes  Consents Verified? Yes  Rad Studies Available? N/A  Safety Precautions Reviewed? Yes  Biochemist, clinical Number 4  Station Number 2  UF/Alarm Test Passed  Conductivity: Meter 14  Conductivity: Machine  14.1  pH 7.2  Reverse Osmosis Main  Normal Saline Lot Number [id number]  Dialyzer Lot Number [id number]  Disposable Set Lot Number [id number]  Machine Temperature 98.6 F (37 C)  Immunologist and Audible Yes  Blood Lines Intact and Secured Yes  Pre Treatment Patient Checks  Vascular access used during treatment Fistula  Hepatitis B Surface Antigen Results Negative  Date Hepatitis B Surface Antigen Drawn 01/12/18  Isolation Initiated Yes  Hepatitis B Surface Antibody 11  Date Hepatitis B Surface Antibody Drawn 01/12/18  Hemodialysis Consent Verified Yes  Hemodialysis Standing Orders Initiated Yes  ECG (Telemetry) Monitor On Yes  Prime Ordered Normal Saline  Length of  DialysisTreatment -hour(s) 3.5 Hour(s)  Dialysis Treatment Comments Na 140  Dialyzer Elisio 17H NR  Dialysate 3K, 2.5 Ca  Dialysis Anticoagulant None  Dialysate Flow Ordered 600  Blood Flow Rate Ordered 400 mL/min  Ultrafiltration Goal 2.5 Liters  Pre Treatment Labs BMET;CBC;Other (Comment)  Dialysis Blood  Pressure Support Ordered Normal Saline  During Hemodialysis Assessment  Blood Flow Rate (mL/min) 400 mL/min  Arterial Pressure (mmHg) -160 mmHg  Venous Pressure (mmHg) 150 mmHg  Transmembrane Pressure (mmHg) 50 mmHg  Ultrafiltration Rate (mL/min) 860 mL/min  Dialysate Flow Rate (mL/min) 600 ml/min  Conductivity: Machine  14.1  HD Safety Checks Performed Yes  Dialysis Fluid Bolus Normal Saline  Bolus Amount (mL) 250 mL  Intra-Hemodialysis Comments Tx initiated;Progressing as prescribed  Education / Care Plan  Dialysis Education Provided Yes (Pt nods in agreement/confused)  Documented Education in Care Plan Yes (Pt confused)  Fistula / Graft Right Upper arm Arteriovenous fistula  No Placement Date or Time found.   Placed prior to admission: Yes  Orientation: Right  Access Location: Upper arm  Access Type: Arteriovenous fistula  Site Condition No complications  Fistula / Graft Assessment Present;Thrill;Bruit  Status Accessed  Needle Size 15g   Drainage Description None

## 2018-04-06 NOTE — Progress Notes (Signed)
PT Cancellation Note  Patient Details Name: KATERINE SIPLE MRN: [id number] DOB: 1965/05/29   Cancelled Treatment:    Reason Eval/Treat Not Completed: Other (comment)(PT orders discontinued at this time, PT spoke with RN to confirm pt's status is to be changed to comfort care. PT to sign off. )   Olga Coaster PT, DPT [phone] PM,04/06/18 [phone]

## 2018-04-06 NOTE — Progress Notes (Signed)
Post HD Treatment   Pt tolerated treatment well. She did meet the new treatment goal of 2L, and her BVP was 79.2. Patient complains of pain in her back continuously. Primary RN aware. Report called to primary RN Eli Phillips, RN.    04/06/18 1440  Hand-Off documentation  Report given to (Full Name) Eli Phillips, RN  Report received from (Full Name) Felecia Jan, RN  Vital Signs  Temp 98.4 F (36.9 C)  Temp Source Oral  Pulse Rate 60  Pulse Rate Source Monitor  Resp 15  BP (!) 148/53  BP Location Left Arm  BP Method Automatic  Patient Position (if appropriate) Lying  Oxygen Therapy  SpO2 100 %  O2 Device Nasal Cannula  O2 Flow Rate (L/min) 2 L/min  Pain Assessment  Pain Scale PAINAD  PAINAD (Pain Assessment in Advanced Dementia)  Breathing 0  Negative Vocalization 2  Facial Expression 2  Body Language 1  Consolability 1  PAINAD Score 6  Dialysis Weight  Weight 122 kg  Type of Weight Post-Dialysis  Post-Hemodialysis Assessment  Rinseback Volume (mL) 250 mL  KECN 252 V  Dialyzer Clearance Lightly streaked  Duration of HD Treatment -hour(s) 3.5 hour(s)  Hemodialysis Intake (mL) 750 mL  UF Total -Machine (mL) 2750 mL  Net UF (mL) 2000 mL  Tolerated HD Treatment Yes  AVG/AVF Arterial Site Held (minutes) 10 minutes  AVG/AVF Venous Site Held (minutes) 10 minutes  Fistula / Graft Right Upper arm Arteriovenous fistula  No Placement Date or Time found.   Placed prior to admission: Yes  Orientation: Right  Access Location: Upper arm  Access Type: Arteriovenous fistula  Site Condition No complications  Fistula / Graft Assessment Present;Thrill;Bruit  Drainage Description None

## 2018-04-06 NOTE — Progress Notes (Signed)
New hospice home referral received from CSW [facility] following a Palliative Medicine consult. Patient information faxed to referral. Writer spoke in the room with patient's daughter [name], plan is to follow up in the morning regarding bed availability. CSW York Spaniel made aware. Patient information faxed to referral.  Dayna Barker RN, BSN, [facility] [place], hospital liaison [phone]

## 2018-04-06 NOTE — Progress Notes (Signed)
Chaplain spoke with daughter in hallway to offer emotional and grief support. Daughter reported movement towards hospice. Daughter is managing mother's needs with her family needs. Listening presence, grief counseling, and words of assurance offered.     04/06/18 1700  Clinical Encounter Type  Visited With Family  Visit Type Follow-up  Referral From Chaplain  Spiritual Encounters  Spiritual Needs Emotional;Grief support

## 2018-04-06 NOTE — Progress Notes (Signed)
Pre HD Assessment    04/06/18 1045  Neurological  Level of Consciousness Alert  Orientation Level Oriented to person;Disoriented to place;Disoriented to time;Disoriented to situation  Respiratory  Respiratory Pattern Regular  Chest Assessment Chest expansion symmetrical  Bilateral Breath Sounds Diminished  Cardiac  Pulse Regular  Heart Sounds S1, S2  Jugular Venous Distention (JVD) No  ECG Monitor Yes  Cardiac Rhythm SB  Antiarrhythmic device No  Vascular  R Radial Pulse +2  L Radial Pulse +2  R Dorsalis Pedis Pulse Other (Comment)  L Dorsalis Pedis Pulse +2  Edema Right lower extremity;Left lower extremity  Generalized Edema +1  RLE Edema +1  Integumentary  Integumentary (WDL) X  Skin Color Appropriate for ethnicity  Skin Condition Dry  Skin Integrity Amputation;Ecchymosis;Abrasion  Ecchymosis Location Back;Leg;Shoulder  Ecchymosis Location Orientation Right;Left  Musculoskeletal  Musculoskeletal (WDL) X  Generalized Weakness Yes  Gastrointestinal  Bowel Sounds Assessment Active  GU Assessment  Genitourinary (WDL) X (HD pt)  Genitourinary Symptoms Anuria  Psychosocial  Psychosocial (WDL) WDL  Patient Behaviors Appropriate for situation;Cooperative;Calm

## 2018-04-06 NOTE — Progress Notes (Signed)
PT Cancellation Note  Patient Details Name: Kimberly Montgomery MRN: [id number] DOB: January 06, 1966   Cancelled Treatment:    Reason Eval/Treat Not Completed: Other (comment)(PT spoke with RN; would like PT to hold until there has been further assessment and establishment of plan of care. PT will continue to follow case and follow up as indicated.)   Olga Coaster PT, DPT 9:27 AM,04/06/18 [phone]

## 2018-04-06 NOTE — Progress Notes (Signed)
[facility] [place] [facility]   PATIENT NAME: Kimberly Montgomery    MR#:  [id number]  DATE OF BIRTH:  11-26-1965  SUBJECTIVE:  No acute events overnight Still confused   REVIEW OF SYSTEMS:    Unable to obtain as patient is confused   DRUG ALLERGIES:   Allergies  Allergen Reactions  . Cephalosporins Anaphylaxis    Patient has tolerated meropenem.   [name] Penicillins Anaphylaxis and Other (See Comments)    Has patient had a PCN reaction causing immediate rash, facial/tongue/throat swelling, SOB or lightheadedness with hypotension: Yes Has patient had a PCN reaction causing severe rash involving mucus membranes or skin necrosis: No Has patient had a PCN reaction that required hospitalization No Has patient had a PCN reaction occurring within the last 10 years: No If all of the above answers are "NO", then may proceed with Cephalosporin use.  . Reglan [Metoclopramide] Other (See Comments)    Severe EPS (lip, head, body tremors) March 2018  . Risperdal [Risperidone] Other (See Comments)    Severe EPS (lip, head, body tremors) March 2018  . Lamictal [Lamotrigine] Other (See Comments)    Reaction:  Hallucinations  . Phenergan [Promethazine Hcl] Nausea And Vomiting    Sensitivity to medicine  . Sulfamethoxazole-Trimethoprim     Other reaction(s): hyperkalemia  . Sulfasalazine Other (See Comments)    Reaction:  Unknown   . Pravastatin Other (See Comments)    Reaction:  Muscle pain   . Sulfa Antibiotics Other (See Comments)    Reaction:  Unknown     VITALS:  Blood pressure (!) 118/52, pulse (!) 53, temperature 98.4 F (36.9 C), temperature source Oral, resp. rate 16, height 5\' 9"  (1.753 m), weight 125.2 kg, last menstrual period 03/16/2015, SpO2 100 %.  PHYSICAL EXAMINATION:  Constitutional: Appearsconfused HENT: Normocephalic. [name] Oropharynx is clear  Eyes: Conjunctivae are normal. no scleral icterus.  Neck: Normal ROM. Neck supple. No JVD. No tracheal  deviation. CVS: RRR, S1/S2 +, no murmurs, no gallops, no carotid bruit.  Pulmonary: Effort and breath sounds normal, no stridor, rhonchi, wheezes, rales.  Abdominal: Soft. BS +,  no distension, tenderness, rebound or guarding.  Musculoskeletal: Patient status post right below-knee amputation previously. Neuro:  lethargic. [name] No focal deficits. Skin: Skin is warm and dry. No rash noted. Psychiatric: Lethargic/confused   LABORATORY PANEL:   CBC Recent Labs  Lab 04/04/18 0505  WBC 6.9  HGB 11.6*  HCT 39.2  PLT 81*   ------------------------------------------------------------------------------------------------------------------  Chemistries  Recent Labs  Lab 04/04/18 0505  NA 141  K 3.9  CL 103  CO2 30  GLUCOSE 102*  BUN 25*  CREATININE 4.10*  CALCIUM 8.2*  MG 2.3   ------------------------------------------------------------------------------------------------------------------  Cardiac Enzymes No results for input(s): TROPONINI in the last 168 hours. ------------------------------------------------------------------------------------------------------------------  RADIOLOGY:  No results found.   ASSESSMENT AND PLAN:  53 year old female with end-stage renal disease on hemodialysis, seizure disorder and chronic diastolic heart failure with preserved ejection fraction who presented to the ER due to altered mental status.  1.  Acute encephalopathy in the setting of hypercarbia and enterococcus urinary tract infection with the possibility of underlying dementia:  Patient remains confused and lethargic  Overall prognosis poor  MRI of the brain without acute intracranial finding Patient would benefit from hospice services if family decides to decline further dialysis treatment for this patient.  2.  Acute hypoxic hypoxic/hypercarbic respiratory failure: Patient has been weaned from BiPAP to baseline nasal cannula.    3.  End-stage renal disease: Patient currently  receiving dialysis.  Patient's family may want to forego dialysis as patient's prognosis is poor.  4.  Enterococcus UTI: Continue vancomycin  5  History of seizure disorder: Continue Keppra and Trileptal  6.  Psych issues: Hold Abilify, Cogentin, Lexapro due to encephalopathy  7.  CAD: Continue Plavix, isosorbide  8.  Hypertension: Continue isosorbide  Discussed with palliative care this morning.  Meeting arranged for today.  If patient's family decides on no further dialysis and patient would benefit from hospice/hospice home.     CODE STATUS: FULL  TOTAL TIME TAKING CARE OF THIS PATIENT: 21 minutes.     POSSIBLE D/C ??, DEPENDING ON CLINICAL CONDITION.   Jaydence Vanyo M.D on 04/06/2018 at 12:12 PM  7 AM to 6 PM;[phone]  After 6pm go to [email or web address] - password [email or web address]  [facility]  Office  [phone]  CC: Primary care physician; Dorothey Baseman, MD  Note: This dictation was prepared with Dragon dictation along with smaller phrase technology. Any transcriptional errors that result from this process are unintentional.  Notified by pharmacy staff patient already on IV meropenem with renal dosing.  No indication to add ciprofloxacin.  Continue IV meropenem to complete treatment duration pending results of culture and sensitivities.

## 2018-04-06 NOTE — Progress Notes (Signed)
[facility]  ROUNDING NOTE   Subjective:   Patient continues to remain confused.  Oriented only to self.  Responds to name but not able to answer any questions meaningfully.   HEMODIALYSIS FLOWSHEET:  Blood Flow Rate (mL/min): 400 mL/min Arterial Pressure (mmHg): -170 mmHg Venous Pressure (mmHg): 180 mmHg Transmembrane Pressure (mmHg): 40 mmHg Ultrafiltration Rate (mL/min): 500 mL/min Dialysate Flow Rate (mL/min): 600 ml/min Conductivity: Machine : 14 Conductivity: Machine : 14 Dialysis Fluid Bolus: Normal Saline Bolus Amount (mL): 250 mL    Objective:  Vital signs in last 24 hours:  Temp:  [97.7 F (36.5 C)-98.6 F (37 C)] 98.4 F (36.9 C) (01/06 1047) Pulse Rate:  [53-65] 61 (01/06 1330) Resp:  [15-20] 17 (01/06 1330) BP: (116-156)/(50-63) 156/60 (01/06 1330) SpO2:  [96 %-100 %] 100 % (01/06 1330) Weight:  [125.2 kg] 125.2 kg (01/06 1047)  Weight change:  Filed Weights   04/01/18 1027 04/01/18 1406 04/06/18 1047  Weight: 123.8 kg 122.5 kg 125.2 kg    Intake/Output: I/O last 3 completed shifts: In: 50 [P.O.:50] Out: 0    Intake/Output this shift:  No intake/output data recorded.  Physical Exam: General: NAD  Head: [place]/AT  Eyes: Anicteric,  Neck: Supple, trachea midline  Lungs:  Clear to auscultation, 2L [place]   Heart: Regular rate and rhythm  Abdomen:  Soft, nontender, obese  Extremities: Right BKA  Neurologic: Alert to self intermittently. Follows simple commands  Skin: No lesions  Access: Right AVF    Basic Metabolic Panel: Recent Labs  Lab 03/31/18 0335 04/01/18 0457 04/02/18 0542 04/03/18 0927 04/04/18 0505  NA 138 137 139  --  141  K 3.1* 3.5 5.3*  --  3.9  CL 98 98 102  --  103  CO2 30 31 23   --  30  GLUCOSE 73 103* 165*  --  102*  BUN 16 22* 17  --  25*  CREATININE 3.71* 4.82* 4.10*  --  4.10*  CALCIUM 8.2* 8.2* 8.5*  --  8.2*  MG  --   --  2.3 2.3 2.3  PHOS  --   --  4.7* 4.9* 3.7    Liver Function Tests: No results  for input(s): AST, ALT, ALKPHOS, BILITOT, PROT, ALBUMIN in the last 168 hours. No results for input(s): LIPASE, AMYLASE in the last 168 hours. No results for input(s): AMMONIA in the last 168 hours.  CBC: Recent Labs  Lab 03/31/18 0335 04/01/18 0941 04/02/18 0542 04/03/18 0927 04/04/18 0505  WBC 5.0 5.6 14.1* 11.1* 6.9  HGB 11.3* 11.5* 12.4 12.0 11.6*  HCT 38.4 39.4 43.4 40.1 39.2  MCV 94.8 97.0 98.4 95.9 94.7  PLT 152 135* 74* 67* 81*    Cardiac Enzymes: No results for input(s): CKTOTAL, CKMB, CKMBINDEX, TROPONINI in the last 168 hours.  BNP: Invalid input(s): POCBNP  CBG: Recent Labs  Lab 04/05/18 0740 04/05/18 1111 04/05/18 1655 04/05/18 2117 04/06/18 0736  GLUCAP 135* 147* 138* 136* 97    Microbiology: Results for orders placed or performed during the hospital encounter of 03/30/18  Urine Culture     Status: Abnormal   Collection Time: 03/30/18 10:14 AM  Result Value Ref Range Status   Specimen Description   Final    URINE, CLEAN CATCH Performed at [facility], [address] [place], [place] [address]    Special Requests   Final    Normal Performed at [facility], [address] [place], [place] [address]    Culture >=  100,000 COLONIES/mL ENTEROCOCCUS RAFFINOSUS (A)  Final   Report Status 04/02/2018 FINAL  Final   Organism ID, Bacteria ENTEROCOCCUS RAFFINOSUS (A)  Final      Susceptibility   Enterococcus raffinosus - MIC*    AMPICILLIN 8 SENSITIVE Sensitive     LEVOFLOXACIN >=8 RESISTANT Resistant     NITROFURANTOIN 32 SENSITIVE Sensitive     VANCOMYCIN <=0.5 SENSITIVE Sensitive     * >=100,000 COLONIES/mL ENTEROCOCCUS RAFFINOSUS  MRSA PCR Screening     Status: Abnormal   Collection Time: 03/30/18 11:47 AM  Result Value Ref Range Status   MRSA by PCR POSITIVE (A) NEGATIVE Final    Comment:        The GeneXpert MRSA Assay (FDA approved for NASAL specimens only), is one component of a comprehensive MRSA  colonization surveillance program. It is not intended to diagnose MRSA infection nor to guide or monitor treatment for MRSA infections. RESULT CALLED TO, READ BACK BY AND VERIFIED WITHBrynda Greathouse:  ALEXI FRASER AT 1317 03/30/18 SDR Performed at [facility], [address] [place], [place] [address]     Coagulation Studies: No results for input(s): LABPROT, INR in the last 72 hours.  Urinalysis: No results for input(s): COLORURINE, LABSPEC, PHURINE, GLUCOSEU, HGBUR, BILIRUBINUR, KETONESUR, PROTEINUR, UROBILINOGEN, NITRITE, LEUKOCYTESUR in the last 72 hours.  Invalid input(s): APPERANCEUR    Imaging: No results found.   Medications:   . vancomycin 1,000 mg (04/06/18 1311)   . aspirin  300 mg Rectal Daily  . budesonide  0.5 mg Nebulization BID  . chlorhexidine  15 mL Mouth Rinse BID  . clopidogrel  75 mg Oral Q1200  . feeding supplement (NEPRO CARB STEADY)  237 mL Oral TID BM  . fluticasone  1 spray Each Nare Q1200  . insulin aspart  0-5 Units Subcutaneous QHS  . insulin aspart  0-9 Units Subcutaneous TID WC  . lactulose  10 g Oral q1800  . levETIRAcetam  1,000 mg Oral QHS  . lidocaine  1 patch Transdermal QHS  . lidocaine  1 application Topical Q M,W,F-HD  . mouth rinse  15 mL Mouth Rinse q12n4p  . Melatonin  2.5 mg Oral Q2000  . multivitamin  1 tablet Oral QHS  . OXcarbazepine  150 mg Oral QHS  . pantoprazole  40 mg Oral BID  . patiromer  16.8 g Oral Once  . polyvinyl alcohol  1 drop Both Eyes TID  . tiotropium  1 capsule Inhalation Q1200   acetaminophen, acetaminophen, albuterol, bisacodyl, HYDROcodone-acetaminophen, morphine injection, nitroGLYCERIN, ondansetron **OR** ondansetron (ZOFRAN) IV  Assessment/ Plan:  Kimberly Montgomery is a 53 y.o. white female with end stage renal disease on hemodialysis, COPD, diabetes mellitus type II, hypertension, congestive heart failure, coronary artery disease, CVA, seizure disorder, depression/bipolar disorder.   Hospital  course: admitted on 03/30/2018 for altered mental status. Thought to have a urinary tract infection. Placed on broad spectrum antibiotics.   [facility] [address] Right AVF 106kg.   1. End Stage Renal Disease: hemodialysis MWF schedule. Patient seen during dialysis Moaning during HD  2. Hypertension: Currently off all agents.  Outpatient regimen of metoprolol, lisinopril, torsemide, amlodipine, hydralazine and isosorbide mononitrate.  Blood pressure at goal  3. Anemia of chronic kidney disease:   - Hb 11.6; Hold EPO.   4. Secondary Hyperparathyroidism:  - sevelamer with meals. When able to take PO  5. Altered mental status: similar presentation in November 2019. Was secondary to urinary tract infection. UA consistent with  infection.  Also with hypercapnea on admission - placed on BIPAP on admission.  MRI negative Neurology eval ongoing.   6. Urinary Tract Infection: Enterococcus species.  - IV vancomycin 1 gram until 04/15/18.    LOS: 7 Randell Teare 1/6/20201:42 PM

## 2018-04-06 NOTE — Progress Notes (Signed)
MD ordered UF goal change after reassessment of patient. New goal is now 2 Liters to be removed.     04/06/18 1300  Vital Signs  Pulse Rate (!) 59  Pulse Rate Source Monitor  Resp 15  BP (!) 133/57  BP Location Left Arm  BP Method Automatic  Patient Position (if appropriate) Lying  Oxygen Therapy  SpO2 100 %  O2 Device Nasal Cannula  O2 Flow Rate (L/min) 2 L/min  During Hemodialysis Assessment  Blood Flow Rate (mL/min) 400 mL/min  Arterial Pressure (mmHg) -180 mmHg  Venous Pressure (mmHg) 170 mmHg  Transmembrane Pressure (mmHg) 50 mmHg  Ultrafiltration Rate (mL/min) 500 mL/min  Dialysate Flow Rate (mL/min) 600 ml/min  Conductivity: Machine  14.1  HD Safety Checks Performed Yes  Intra-Hemodialysis Comments Progressing as prescribed (MD at bedside' UF 1845)

## 2018-04-06 NOTE — Progress Notes (Signed)
SLP Cancellation Note  Patient Details Name: Kimberly Montgomery MRN: [id number] DOB: 1965-04-27   Cancelled treatment:       Reason Eval/Treat Not Completed: Patient at procedure or test/unavailable(chart reviewed; pt out of room). Pt is at HD. ST services will attempt to f/u later today or in the morning.    Jerilynn Som, MS, CCC-SLP Watson,Katherine 04/06/2018, 12:10 PM

## 2018-04-06 NOTE — Consult Note (Signed)
Consultation Note Date: 04/06/2018   Patient Name: Kimberly Montgomery  DOB: 1965/06/15  MRN: [id number]  Age / Sex: 53 y.o., female  PCP: Juluis Pitch, MD Referring Physician: Bettey Costa, MD  Reason for Consultation: Establishing goals of care  HPI/Patient Profile: 53 y.o. female  with past medical history of ESRD on HD TTS, dCHF, h/o CVA, seizures, bipolar disorder, HTN, diabetes, GERD, gastroparesis admitted on 03/30/2018 with AMS d/t UTI with likely underlying mild-mod vascular dementia. Alertness has improved but confusion has continued throughout hospitalization.   Clinical Assessment and Goals of Care: I met this morning with Kimberly Montgomery. She is sleeping but arouses when I come to bedside. She is very hard of hearing but also appears to have delayed processing when I ask her simple questions. She is able to tell me "I think I'm in the hospital" when I ask where we are but does not answer any further questions. She requests a cup of coffee. RN reports that she did report to him that her bottom hurt. Otherwise she has been receiving pain medication secondary to yellowing out and signs of discomfort. Kimberly Montgomery is clearly not able to participate in decision making at this time. Of note Kimberly Montgomery appears extremely hard of hearing (which family says she has never had hearing loss or issues prior to today).   I met also with Kimberly, Kimberly Montgomery, as her mother slept. Kimberly Montgomery was joined by a friend for support. We spoke of worsening mental status (overall). Kimberly Montgomery says that her mother only occasionally knows who she is and that these moments usually pass quickly. Health says her mother had a pretty good day at Christmas but this was the best she had been since October. Even at this time she had to remind her mother to not dip her donut into ketchup and has to almost treat her like a child. She does not believe that her  mother would want to live this way. We reviewed Living Will that confirms that her mother would not want to live this way.   Kimberly Montgomery plans to discuss with her grandfather and brother moving towards comfort care, stopping dialysis, and transition to hospice facility for EOL. I will continue to follow and help support Kimberly Montgomery in this transition.   Late entry: Kimberly Montgomery called me back to room. I spoke with Kimberly Montgomery and her brother. Kimberly Montgomery is tearful as her mother is alert and knows who family is but is otherwise confused. We discussed that based on the fact that this is almost the best she has been since October - is this an acceptable QOL for her? They both quickly indicated she would not want to live this way. They believe that she would be happier in heaven with their father. Kimberly Montgomery also shares that her mother has already made this decision in her Living Will. They have decided to stop dialysis and provide comfort care. Kimberly Montgomery does not want her mother to die in hospice or at SNF and opts for hospice facility. Emotional support provided.  Primary Decision Maker HCPOA Kimberly Montgomery    SUMMARY OF RECOMMENDATIONS   - DNR - No further plans for HD - Transition to hospice facility for EOL care  Code Status/Advance Care Planning:  DNR   Symptom Management:   Medications minimized to ensure comfort.   PRN medications added to ensure comfort at EOL.   Palliative Prophylaxis:   Bowel Regimen, Delirium Protocol, Frequent Pain Assessment, Oral Care, Palliative Wound Care and Turn Reposition  Additional Recommendations (Limitations, Scope, Preferences):  Full Comfort Care  Psycho-social/Spiritual:   Desire for further Chaplaincy support:yes  Additional Recommendations: Caregiving  Support/Resources, Education on Hospice and Grief/Bereavement Support  Prognosis:   < 2 weeks  Discharge Planning: Hospice facility      Primary Diagnoses: Present on Admission: . Acute respiratory  failure with hypercapnia ([place]) . Stage 1 skin ulcer of sacral region [facility])   I have reviewed the medical record, interviewed the patient and family, and examined the patient. The following aspects are pertinent.  Past Medical History:  Diagnosis Date  . Anginal pain ([place])   . Anxiety   . Bipolar disorder ([place])   . CAD (coronary artery disease)   . CHF (congestive heart failure) ([place])   . Chronic lower back pain   . Depression   . Endometriosis   . ESRD (end stage renal disease) on dialysis ([place])    "[facility]; [place]; [place]; TTS" (01/19/2016)  . Gastroparesis   . GERD (gastroesophageal reflux disease)   . Heart attack ([place]) 08/2017   stent placed, [facility]  . History of blood transfusion "several"   "my blood would get low; low RBC"  . History of hiatal hernia   . HLD (hyperlipidemia)   . Hypertension   . Migraine    "monthly" (01/19/2016)  . Myocardial infarction ([place]) 2017   "~ 3 wks ago" (01/19/2016)  . Renal disorder   . Renal insufficiency   . Seizures ([place]) 07/2015   "I've only had the 1; don't know what from" (01/19/2016)  . Stroke ([place])   . Type II diabetes mellitus ([place])    Social History   Socioeconomic History  . Marital status: Widowed    Spouse name: Not on file  . Number of children: 2  . Years of education: Not on file  . Highest education level: Not on file  Occupational History  . Occupation: disabled  Social Needs  . Financial resource strain: Not on file  . Food insecurity:    Worry: Not on file    Inability: Not on file  . Transportation needs:    Medical: Not on file    Non-medical: Not on file  Tobacco Use  . Smoking status: Former Smoker    Packs/day: 0.75    Types: Cigarettes    Last attempt to quit: 07/01/2015    Years since quitting: 2.7  . Smokeless tobacco: Never Used  Substance and Sexual Activity  . Alcohol use: No    Alcohol/week: 0.0 standard drinks  . Drug use: No  . Sexual activity: Not Currently    Lifestyle  . Physical activity:    Days per week: Not on file    Minutes per session: Not on file  . Stress: Not on file  Relationships  . Social connections:    Talks on phone: Not on file    Gets together: Not on file    Attends religious service: Not on file    Active member of club or organization: Not on file  Attends meetings of clubs or organizations: Not on file    Relationship status: Not on file  Other Topics Concern  . Not on file  Social History Narrative   06/17/16 currently at [facility] [place], [place]   widowed   Family History  Problem Relation Age of Onset  . CAD Other   . Diabetes Other   . Bipolar disorder Other   . Cervical cancer Mother   . Other Father        heart bypass  . Breast cancer Neg Hx    Scheduled Meds: . aspirin  300 mg Rectal Daily  . budesonide  0.5 mg Nebulization BID  . chlorhexidine  15 mL Mouth Rinse BID  . clopidogrel  75 mg Oral Q1200  . feeding supplement (NEPRO CARB STEADY)  237 mL Oral TID BM  . fluticasone  1 spray Each Nare Q1200  . insulin aspart  0-5 Units Subcutaneous QHS  . insulin aspart  0-9 Units Subcutaneous TID WC  . lactulose  10 g Oral q1800  . levETIRAcetam  1,000 mg Oral QHS  . lidocaine  1 patch Transdermal QHS  . lidocaine  1 application Topical Q M,W,F-HD  . mouth rinse  15 mL Mouth Rinse q12n4p  . Melatonin  2.5 mg Oral Q2000  . multivitamin  1 tablet Oral QHS  . OXcarbazepine  150 mg Oral QHS  . pantoprazole  40 mg Oral BID  . patiromer  16.8 g Oral Once  . polyvinyl alcohol  1 drop Both Eyes TID  . tiotropium  1 capsule Inhalation Q1200   Continuous Infusions: . vancomycin     PRN Meds:.acetaminophen, acetaminophen, albuterol, bisacodyl, morphine injection, nitroGLYCERIN, ondansetron **OR** ondansetron (ZOFRAN) IV Allergies  Allergen Reactions  . Cephalosporins Anaphylaxis    Patient has tolerated meropenem.   [name] Penicillins Anaphylaxis and Other (See Comments)    Has  patient had a PCN reaction causing immediate rash, facial/tongue/throat swelling, SOB or lightheadedness with hypotension: Yes Has patient had a PCN reaction causing severe rash involving mucus membranes or skin necrosis: No Has patient had a PCN reaction that required hospitalization No Has patient had a PCN reaction occurring within the last 10 years: No If all of the above answers are "NO", then may proceed with Cephalosporin use.  . Reglan [Metoclopramide] Other (See Comments)    Severe EPS (lip, head, body tremors) March 2018  . Risperdal [Risperidone] Other (See Comments)    Severe EPS (lip, head, body tremors) March 2018  . Lamictal [Lamotrigine] Other (See Comments)    Reaction:  Hallucinations  . Phenergan [Promethazine Hcl] Nausea And Vomiting    Sensitivity to medicine  . Sulfamethoxazole-Trimethoprim     Other reaction(s): hyperkalemia  . Sulfasalazine Other (See Comments)    Reaction:  Unknown   . Pravastatin Other (See Comments)    Reaction:  Muscle pain   . Sulfa Antibiotics Other (See Comments)    Reaction:  Unknown    Review of Systems  Unable to perform ROS: Dementia    Physical Exam Vitals signs and nursing note reviewed.  Constitutional:      General: She is not in acute distress.    Appearance: She is overweight. She is ill-appearing.  Cardiovascular:     Rate and Rhythm: Normal rate.  Pulmonary:     Effort: Pulmonary effort is normal. No tachypnea, accessory muscle usage or respiratory distress.  Abdominal:     Palpations: Abdomen is soft.  Neurological:  Mental Status: She is alert. Mental status is at baseline. She is confused.     Comments: Oriented to person and to place (although this fluctuates as does her ability to recognize family per their report)     Vital Signs: BP (!) 148/63 (BP Location: Left Arm)   Pulse 65   Temp 98.6 F (37 C) (Oral)   Resp 16   Ht _0  (1.753 m)   Wt 122.5 kg   LMP 03/16/2015 Comment: partial hysterectomy   SpO2 98%   BMI 39.87 kg/m  Pain Scale: 0-10 POSS *See Group Information*: S-Acceptable,Sleep, easy to arouse Pain Score: 0-No pain   SpO2: SpO2: 98 % O2 Device:SpO2: 98 % O2 Flow Rate: .O2 Flow Rate (L/min): 2 L/min  IO: Intake/output summary:   Intake/Output Summary (Last 24 hours) at 04/06/2018 0945 Last data filed at 04/05/2018 1506 Gross per 24 hour  Intake -  Output 0 ml  Net 0 ml    LBM: Last BM Date: 03/31/18 Baseline Weight: Weight: 106 kg Most recent weight: Weight: 122.5 kg     Palliative Assessment/Data: 30%     Time In/Out: 0930-1030, 1500-1530 Time Total: 90 min Greater than 50%  of this time was spent counseling and coordinating care related to the above assessment and plan.  Signed by: Vinie Sill, NP Palliative Medicine Team Pager # [phone] (M-F 8a-5p) Team Phone # [phone] (Nights/Weekends)

## 2018-04-07 MED ORDER — HYDROMORPHONE HCL 1 MG/ML IJ SOLN
0.5000 mg | INTRAMUSCULAR | Status: DC | PRN
  Administered 2018-04-07: 0.5 mg via INTRAVENOUS
  Filled 2018-04-07: qty 0.5

## 2018-04-07 MED ORDER — LORAZEPAM 2 MG/ML IJ SOLN: 1.0000 mg | INTRAMUSCULAR | 0 refills | Status: AC | PRN

## 2018-04-07 MED ORDER — MORPHINE SULFATE (CONCENTRATE) 10 MG /0.5 ML PO SOLN: 10.0000 mg | ORAL | 0 refills | Status: DC | PRN

## 2018-04-07 MED ORDER — DIPHENHYDRAMINE HCL 50 MG/ML IJ SOLN
25.0000 mg | Freq: Once | INTRAMUSCULAR | Status: AC
  Administered 2018-04-07: 25 mg via INTRAVENOUS
  Filled 2018-04-07: qty 1

## 2018-04-07 NOTE — Care Management (Signed)
Dimas Chyle dialysis liaison notified of discharge to hospice home

## 2018-04-07 NOTE — Progress Notes (Signed)
Follow up visit made to new Hopsice home referral. Patient is a 53 year old woman with multiple medical problems including ESRD dialysis dependent. She was admitted from [facility] on 12/30 for evaluation of altered mental status and acute respiratory failure requiring Bipap on admission. She is on chronic 2 liters of oxygen at her facility. She has been treated with IV antibiotics for a UTI, but has continued to have altered mental status, poor appetite and confusion. Palliative Medicine was consulted for goals of care  and met with patient and her daughter. After the last meeting on 1/6, patient's daughter Nira Conn chose to have dialysis stopped and focus care on comfort with transfer to the hospice home. Patient has required IV morphine for pain, she is eating small amounts but remains confused. Last dialysis was 1/6, patient is anuric. Writer met in the class room with Nira Conn and her best friend Estill Bamberg to initiate education regarding hospice services, philosophy and team approach to care with understanding voiced. Questions answered, consents signed. Patient seen lying in bed, smiles easily, able to answer simple questions with delayed response. Discussed symptom management with NP Vinie Sill, IV morphine changed to IV dilaudid d/t ESRD.  Plan is for discharge to the hospice home today via EMS with signed DNR in place. Hospital care team updated. Report called to the hospice home, EMS notified for a 5 pm pick up. Updated notes and discharge summary faxed to referral.  Thank you for the opportunity to be involved in the care of this patient and her family. Flo Shanks RN, BSN, [place] [facility] [place], hospital Liaison [phone]

## 2018-04-07 NOTE — Progress Notes (Signed)
Kimberly Montgomery to be D/C'd Hospice  per MD order.  Discussed prescriptions and follow up appointments with the patient. Prescriptions given to patient, medication list explained in detail. Pt verbalized understanding.  Allergies as of 04/07/2018      Reactions   Cephalosporins Anaphylaxis   Patient has tolerated meropenem.    Penicillins Anaphylaxis, Other (See Comments)   Has patient had a PCN reaction causing immediate rash, facial/tongue/throat swelling, SOB or lightheadedness with hypotension: Yes Has patient had a PCN reaction causing severe rash involving mucus membranes or skin necrosis: No Has patient had a PCN reaction that required hospitalization No Has patient had a PCN reaction occurring within the last 10 years: No If all of the above answers are "NO", then may proceed with Cephalosporin use.   Reglan [metoclopramide] Other (See Comments)   Severe EPS (lip, head, body tremors) March 2018   Risperdal [risperidone] Other (See Comments)   Severe EPS (lip, head, body tremors) March 2018   Lamictal [lamotrigine] Other (See Comments)   Reaction:  Hallucinations   Phenergan [promethazine Hcl] Nausea And Vomiting   Sensitivity to medicine   Sulfamethoxazole-trimethoprim    Other reaction(s): hyperkalemia   Sulfasalazine Other (See Comments)   Reaction:  Unknown    Pravastatin Other (See Comments)   Reaction:  Muscle pain    Sulfa Antibiotics Other (See Comments)   Reaction:  Unknown       Medication List    STOP taking these medications   acetaminophen 500 MG tablet Commonly known as:  TYLENOL   albuterol (2.5 MG/3ML) 0.083% nebulizer solution Commonly known as:  PROVENTIL   amLODipine 10 MG tablet Commonly known as:  NORVASC   ARIPiprazole 5 MG tablet Commonly known as:  ABILIFY   aspirin 81 MG chewable tablet   atorvastatin 40 MG tablet Commonly known as:  LIPITOR   benztropine 0.5 MG tablet Commonly known as:  COGENTIN   bisacodyl 10 MG  suppository Commonly known as:  DULCOLAX   budesonide 0.5 MG/2ML nebulizer solution Commonly known as:  PULMICORT   clonazePAM 0.5 MG tablet Commonly known as:  KLONOPIN   clopidogrel 75 MG tablet Commonly known as:  PLAVIX   escitalopram 5 MG tablet Commonly known as:  LEXAPRO   fluticasone 50 MCG/ACT nasal spray Commonly known as:  FLONASE   gabapentin 300 MG capsule Commonly known as:  NEURONTIN   hydrALAZINE 50 MG tablet Commonly known as:  APRESOLINE   HYDROcodone-acetaminophen 5-325 MG tablet Commonly known as:  NORCO/VICODIN   insulin aspart 100 UNIT/ML injection Commonly known as:  novoLOG   isosorbide mononitrate 30 MG 24 hr tablet Commonly known as:  IMDUR   lactulose 10 GM/15ML solution Commonly known as:  CHRONULAC   levETIRAcetam 750 MG tablet Commonly known as:  KEPPRA   lidocaine 4 % cream Commonly known as:  LMX   lidocaine 5 % Commonly known as:  LIDODERM   lisinopril 10 MG tablet Commonly known as:  PRINIVIL,ZESTRIL   LORazepam 0.5 MG tablet Commonly known as:  ATIVAN Replaced by:  LORazepam 2 MG/ML injection   Melatonin 3 MG Tabs   metoprolol tartrate 50 MG tablet Commonly known as:  LOPRESSOR   multivitamin with minerals Tabs tablet   nitroGLYCERIN 0.4 MG SL tablet Commonly known as:  NITROSTAT   omeprazole 20 MG capsule Commonly known as:  PRILOSEC   ondansetron 4 MG disintegrating tablet Commonly known as:  ZOFRAN ODT   OPTIVE 0.5-0.9 % ophthalmic solution Generic drug:  carboxymethylcellul-glycerin   OXcarbazepine 150 MG tablet Commonly known as:  TRILEPTAL   REFRESH LIQUIGEL 1 % Gel Generic drug:  Carboxymethylcellulose Sodium   senna 8.6 MG Tabs tablet Commonly known as:  SENOKOT   sevelamer carbonate 800 MG tablet Commonly known as:  RENVELA   SPIRIVA RESPIMAT 2.5 MCG/ACT Aers Generic drug:  Tiotropium Bromide Monohydrate   torsemide 100 MG tablet Commonly known as:  DEMADEX   vitamin C 250 MG  tablet Commonly known as:  ASCORBIC ACID     TAKE these medications   LORazepam 2 MG/ML injection Commonly known as:  ATIVAN Inject 0.5 mLs (1 mg total) into the vein every 4 (four) hours as needed for anxiety, seizure or sedation. Replaces:  LORazepam 0.5 MG tablet       Vitals:   04/06/18 1945 04/07/18 0454  BP:  (!) 151/54  Pulse:  67  Resp:  20  Temp:  98.8 F (37.1 C)  SpO2: (!) 86% 96%    Skin clean, dry and intact without evidence of skin break down, no evidence of skin tears noted. IV catheter discontinued intact. Site without signs and symptoms of complications. Dressing and pressure applied. Pt denies pain at this time. No complaints noted.  An After Visit Summary was printed and given to the patient. Patient escorted via EMS to Hospice.   Madie Reno, RN

## 2018-04-07 NOTE — Progress Notes (Signed)
Nutrition Follow Up Note    DOCUMENTATION CODES:   Obesity unspecified  INTERVENTION:   Nepro Shake po TID, each supplement provides 425 kcal and 19 grams protein  Recommend Rena-vite daily   Recommend bowel regimen as needed per MD.   NUTRITION DIAGNOSIS:   Inadequate oral intake related to acute illness(AMS) as evidenced by meal completion < 50%. -ongoing   GOAL:   Patient will meet greater than or equal to 90% of their needs  -not met   MONITOR:   PO intake, Labs, Supplement acceptance, Weight trends, Skin, I & O's  ASSESSMENT:   53 y.o. white female with end stage renal disease on hemodialysis, COPD, diabetes mellitus type II, hypertension, congestive heart failure, coronary artery disease, CVA, seizure disorder, depression/bipolar disorder admitted with UTI and AMS    Pt continues to have poor appetite and oral intake; pt eating <50% of meals. Pt is being offered Nepro supplements but refuses most of them. Pt has not had a bowel movement since admit; recommend bowel regimen as needed per MD. Per chart, pt with ~36lb weight gain since admit; RD unsure if bed weights are correct as pt -8.9L on I & O's. Per chart, family moving towards hospice home.   Medications reviewed and include: lactulose, melatonin, protonix  Labs reviewed: none recent   Diet Order:   Diet Order            Diet regular Room service appropriate? Yes; Fluid consistency: Thin  Diet effective now             EDUCATION NEEDS:   No education needs have been identified at this time  Skin:  Skin Assessment: Reviewed RN Assessment(Stage I sacrum, ecchymosis )  Last BM:  pta- constipation   Height:   Ht Readings from Last 1 Encounters:  03/30/18 '5\' 9"'  (1.753 m)    Weight:   Wt Readings from Last 1 Encounters:  04/06/18 122 kg    Ideal Body Weight:  62 kg(adjusted for R BKA)  BMI:  Body mass index is 39.72 kg/m.  Estimated Nutritional Needs:   Kcal:  2100-2400kcal/day    Protein:  109-130g/day   Fluid:  UOP + 1L  Koleen Distance MS, RD, LDN Pager #- [phone] Office#- [phone] After Hours Pager: [phone]

## 2018-04-07 NOTE — Discharge Summary (Addendum)
[facility] [place] [facility]   PATIENT NAME: Kimberly ButteLaura Gelpi    MR#:  [id number]  DATE OF BIRTH:  07/09/1965  DATE OF ADMISSION:  03/30/2018 ADMITTING PHYSICIAN: Alford Highlandichard Wieting, MD  DATE OF DISCHARGE: April 07, 2018  PRIMARY CARE PHYSICIAN: Dorothey BasemanBronstein, David, MD    ADMISSION DIAGNOSIS:  Acute pulmonary edema (HCC) [J81.0] Acute encephalopathy [G93.40] Abdominal pain [R10.9] Acute respiratory failure with hypoxia and hypercapnia (HCC) [J96.01, J96.02]  DISCHARGE DIAGNOSIS:  Active Problems:   Stage 1 skin ulcer of sacral region (HCC)   Acute respiratory failure with hypercapnia (HCC)   Goals of care, counseling/discussion   Palliative care encounter   SECONDARY DIAGNOSIS:   Past Medical History:  Diagnosis Date  . Anginal pain (HCC)   . Anxiety   . Bipolar disorder (HCC)   . CAD (coronary artery disease)   . CHF (congestive heart failure) (HCC)   . Chronic lower back pain   . Depression   . Endometriosis   . ESRD (end stage renal disease) on dialysis (HCC)    "[facility]; [address]; [place]; TTS" (01/19/2016)  . Gastroparesis   . GERD (gastroesophageal reflux disease)   . Heart attack (HCC) 08/2017   stent placed, [facility]  . History of blood transfusion "several"   "my blood would get low; low RBC"  . History of hiatal hernia   . HLD (hyperlipidemia)   . Hypertension   . Migraine    "monthly" (01/19/2016)  . Myocardial infarction (HCC) 2017   "~ 3 wks ago" (01/19/2016)  . Renal disorder   . Renal insufficiency   . Seizures (HCC) 07/2015   "I've only had the 1; don't know what from" (01/19/2016)  . Stroke (HCC)   . Type II diabetes mellitus [facility](HCC)     HOSPITAL COURSE:   53 year old female with end-stage renal disease on hemodialysis, seizure disorder and chronic diastolic heart failure with preserved ejection fraction who presented to the ER due to altered mental status.  1.  Acute encephalopathy in the setting of  hypercarbia and enterococcus urinary tract infection with the possibility of underlying dementia:  Patient remains confused and lethargic  Overall prognosis poor  MRI of the brain without acute intracranial finding   2.  Acute hypoxic hypoxic/hypercarbic respiratory failure: Continue nasal cannula for comfort   3.  End-stage renal disease: Family has decided the patient would not benefit from dialysis due to overall poor prognosis   4.  Enterococcus UTI: He did with vancomycin  5  History of seizure disorder:   6.  Psych issues:   7.  CAD:  8.  Hypertension:   DISCHARGE CONDITIONS AND DIET:   Guarded condition Diet as tolerated  CONSULTS OBTAINED:    DRUG ALLERGIES:   Allergies  Allergen Reactions  . Cephalosporins Anaphylaxis    Patient has tolerated meropenem.   [name]. Penicillins Anaphylaxis and Other (See Comments)    Has patient had a PCN reaction causing immediate rash, facial/tongue/throat swelling, SOB or lightheadedness with hypotension: Yes Has patient had a PCN reaction causing severe rash involving mucus membranes or skin necrosis: No Has patient had a PCN reaction that required hospitalization No Has patient had a PCN reaction occurring within the last 10 years: No If all of the above answers are "NO", then may proceed with Cephalosporin use.  . Reglan [Metoclopramide] Other (See Comments)    Severe EPS (lip, head, body tremors) March 2018  . Risperdal [Risperidone] Other (See Comments)    Severe  EPS (lip, head, body tremors) March 2018  . Lamictal [Lamotrigine] Other (See Comments)    Reaction:  Hallucinations  . Phenergan [Promethazine Hcl] Nausea And Vomiting    Sensitivity to medicine  . Sulfamethoxazole-Trimethoprim     Other reaction(s): hyperkalemia  . Sulfasalazine Other (See Comments)    Reaction:  Unknown   . Pravastatin Other (See Comments)    Reaction:  Muscle pain   . Sulfa Antibiotics Other (See Comments)    Reaction:  Unknown      DISCHARGE MEDICATIONS:   Allergies as of 04/07/2018      Reactions   Cephalosporins Anaphylaxis   Patient has tolerated meropenem.    Penicillins Anaphylaxis, Other (See Comments)   Has patient had a PCN reaction causing immediate rash, facial/tongue/throat swelling, SOB or lightheadedness with hypotension: Yes Has patient had a PCN reaction causing severe rash involving mucus membranes or skin necrosis: No Has patient had a PCN reaction that required hospitalization No Has patient had a PCN reaction occurring within the last 10 years: No If all of the above answers are "NO", then may proceed with Cephalosporin use.   Reglan [metoclopramide] Other (See Comments)   Severe EPS (lip, head, body tremors) March 2018   Risperdal [risperidone] Other (See Comments)   Severe EPS (lip, head, body tremors) March 2018   Lamictal [lamotrigine] Other (See Comments)   Reaction:  Hallucinations   Phenergan [promethazine Hcl] Nausea And Vomiting   Sensitivity to medicine   Sulfamethoxazole-trimethoprim    Other reaction(s): hyperkalemia   Sulfasalazine Other (See Comments)   Reaction:  Unknown    Pravastatin Other (See Comments)   Reaction:  Muscle pain    Sulfa Antibiotics Other (See Comments)   Reaction:  Unknown       Medication List    STOP taking these medications   acetaminophen 500 MG tablet Commonly known as:  TYLENOL   albuterol (2.5 MG/3ML) 0.083% nebulizer solution Commonly known as:  PROVENTIL   amLODipine 10 MG tablet Commonly known as:  NORVASC   ARIPiprazole 5 MG tablet Commonly known as:  ABILIFY   aspirin 81 MG chewable tablet   atorvastatin 40 MG tablet Commonly known as:  LIPITOR   benztropine 0.5 MG tablet Commonly known as:  COGENTIN   bisacodyl 10 MG suppository Commonly known as:  DULCOLAX   budesonide 0.5 MG/2ML nebulizer solution Commonly known as:  PULMICORT   clonazePAM 0.5 MG tablet Commonly known as:  KLONOPIN   clopidogrel 75 MG  tablet Commonly known as:  PLAVIX   escitalopram 5 MG tablet Commonly known as:  LEXAPRO   fluticasone 50 MCG/ACT nasal spray Commonly known as:  FLONASE   gabapentin 300 MG capsule Commonly known as:  NEURONTIN   hydrALAZINE 50 MG tablet Commonly known as:  APRESOLINE   HYDROcodone-acetaminophen 5-325 MG tablet Commonly known as:  NORCO/VICODIN   insulin aspart 100 UNIT/ML injection Commonly known as:  novoLOG   isosorbide mononitrate 30 MG 24 hr tablet Commonly known as:  IMDUR   lactulose 10 GM/15ML solution Commonly known as:  CHRONULAC   levETIRAcetam 750 MG tablet Commonly known as:  KEPPRA   lidocaine 4 % cream Commonly known as:  LMX   lidocaine 5 % Commonly known as:  LIDODERM   lisinopril 10 MG tablet Commonly known as:  PRINIVIL,ZESTRIL   LORazepam 0.5 MG tablet Commonly known as:  ATIVAN Replaced by:  LORazepam 2 MG/ML injection   Melatonin 3 MG Tabs   metoprolol tartrate 50 MG  tablet Commonly known as:  LOPRESSOR   multivitamin with minerals Tabs tablet   nitroGLYCERIN 0.4 MG SL tablet Commonly known as:  NITROSTAT   omeprazole 20 MG capsule Commonly known as:  PRILOSEC   ondansetron 4 MG disintegrating tablet Commonly known as:  ZOFRAN ODT   OPTIVE 0.5-0.9 % ophthalmic solution Generic drug:  carboxymethylcellul-glycerin   OXcarbazepine 150 MG tablet Commonly known as:  TRILEPTAL   REFRESH LIQUIGEL 1 % Gel Generic drug:  Carboxymethylcellulose Sodium   senna 8.6 MG Tabs tablet Commonly known as:  SENOKOT   sevelamer carbonate 800 MG tablet Commonly known as:  RENVELA   SPIRIVA RESPIMAT 2.5 MCG/ACT Aers Generic drug:  Tiotropium Bromide Monohydrate   torsemide 100 MG tablet Commonly known as:  DEMADEX   vitamin C 250 MG tablet Commonly known as:  ASCORBIC ACID     TAKE these medications   LORazepam 2 MG/ML injection Commonly known as:  ATIVAN Inject 0.5 mLs (1 mg total) into the vein every 4 (four) hours as needed for  anxiety, seizure or sedation. Replaces:  LORazepam 0.5 MG tablet         Today   CHIEF COMPLAINT:    Family has decided on comfort care and patient will be going to hospice home   VITAL SIGNS:  Blood pressure (!) 151/54, pulse 67, temperature 98.8 F (37.1 C), temperature source Oral, resp. rate 20, height 5\' 9"  (1.753 m), weight 122 kg, last menstrual period 03/16/2015, SpO2 96 %.   REVIEW OF SYSTEMS:  Review of Systems  Unable to perform ROS: Acuity of condition     PHYSICAL EXAMINATION:  GENERAL: Critically ill-appearing moaning confused Lungs bilateral rhonchi Cardiovascular tachycardia no murmur gallops rubs Extremities 1+ lower extremity edema Abdomen bowel sounds positive nontender nondistended  DATA REVIEW:   CBC Recent Labs  Lab 04/04/18 0505  WBC 6.9  HGB 11.6*  HCT 39.2  PLT 81*    Chemistries  Recent Labs  Lab 04/04/18 0505  NA 141  K 3.9  CL 103  CO2 30  GLUCOSE 102*  BUN 25*  CREATININE 4.10*  CALCIUM 8.2*  MG 2.3    Cardiac Enzymes No results for input(s): TROPONINI in the last 168 hours.  Microbiology Results  @MICRORSLT48 @  RADIOLOGY:  No results found.    Allergies as of 04/07/2018      Reactions   Cephalosporins Anaphylaxis   Patient has tolerated meropenem.    Penicillins Anaphylaxis, Other (See Comments)   Has patient had a PCN reaction causing immediate rash, facial/tongue/throat swelling, SOB or lightheadedness with hypotension: Yes Has patient had a PCN reaction causing severe rash involving mucus membranes or skin necrosis: No Has patient had a PCN reaction that required hospitalization No Has patient had a PCN reaction occurring within the last 10 years: No If all of the above answers are "NO", then may proceed with Cephalosporin use.   Reglan [metoclopramide] Other (See Comments)   Severe EPS (lip, head, body tremors) March 2018   Risperdal [risperidone] Other (See Comments)   Severe EPS (lip, head, body  tremors) March 2018   Lamictal [lamotrigine] Other (See Comments)   Reaction:  Hallucinations   Phenergan [promethazine Hcl] Nausea And Vomiting   Sensitivity to medicine   Sulfamethoxazole-trimethoprim    Other reaction(s): hyperkalemia   Sulfasalazine Other (See Comments)   Reaction:  Unknown    Pravastatin Other (See Comments)   Reaction:  Muscle pain    Sulfa Antibiotics Other (See Comments)   Reaction:  Unknown       Medication List    STOP taking these medications   acetaminophen 500 MG tablet Commonly known as:  TYLENOL   albuterol (2.5 MG/3ML) 0.083% nebulizer solution Commonly known as:  PROVENTIL   amLODipine 10 MG tablet Commonly known as:  NORVASC   ARIPiprazole 5 MG tablet Commonly known as:  ABILIFY   aspirin 81 MG chewable tablet   atorvastatin 40 MG tablet Commonly known as:  LIPITOR   benztropine 0.5 MG tablet Commonly known as:  COGENTIN   bisacodyl 10 MG suppository Commonly known as:  DULCOLAX   budesonide 0.5 MG/2ML nebulizer solution Commonly known as:  PULMICORT   clonazePAM 0.5 MG tablet Commonly known as:  KLONOPIN   clopidogrel 75 MG tablet Commonly known as:  PLAVIX   escitalopram 5 MG tablet Commonly known as:  LEXAPRO   fluticasone 50 MCG/ACT nasal spray Commonly known as:  FLONASE   gabapentin 300 MG capsule Commonly known as:  NEURONTIN   hydrALAZINE 50 MG tablet Commonly known as:  APRESOLINE   HYDROcodone-acetaminophen 5-325 MG tablet Commonly known as:  NORCO/VICODIN   insulin aspart 100 UNIT/ML injection Commonly known as:  novoLOG   isosorbide mononitrate 30 MG 24 hr tablet Commonly known as:  IMDUR   lactulose 10 GM/15ML solution Commonly known as:  CHRONULAC   levETIRAcetam 750 MG tablet Commonly known as:  KEPPRA   lidocaine 4 % cream Commonly known as:  LMX   lidocaine 5 % Commonly known as:  LIDODERM   lisinopril 10 MG tablet Commonly known as:  PRINIVIL,ZESTRIL   LORazepam 0.5 MG  tablet Commonly known as:  ATIVAN Replaced by:  LORazepam 2 MG/ML injection   Melatonin 3 MG Tabs   metoprolol tartrate 50 MG tablet Commonly known as:  LOPRESSOR   multivitamin with minerals Tabs tablet   nitroGLYCERIN 0.4 MG SL tablet Commonly known as:  NITROSTAT   omeprazole 20 MG capsule Commonly known as:  PRILOSEC   ondansetron 4 MG disintegrating tablet Commonly known as:  ZOFRAN ODT   OPTIVE 0.5-0.9 % ophthalmic solution Generic drug:  carboxymethylcellul-glycerin   OXcarbazepine 150 MG tablet Commonly known as:  TRILEPTAL   REFRESH LIQUIGEL 1 % Gel Generic drug:  Carboxymethylcellulose Sodium   senna 8.6 MG Tabs tablet Commonly known as:  SENOKOT   sevelamer carbonate 800 MG tablet Commonly known as:  RENVELA   SPIRIVA RESPIMAT 2.5 MCG/ACT Aers Generic drug:  Tiotropium Bromide Monohydrate   torsemide 100 MG tablet Commonly known as:  DEMADEX   vitamin C 250 MG tablet Commonly known as:  ASCORBIC ACID     TAKE these medications   LORazepam 2 MG/ML injection Commonly known as:  ATIVAN Inject 0.5 mLs (1 mg total) into the vein every 4 (four) hours as needed for anxiety, seizure or sedation. Replaces:  LORazepam 0.5 MG tablet           Stable for discharge hospice Patient should follow up with hospice  CODE STATUS:     Code Status Orders  (From admission, onward)         Start     Ordered   04/06/18 1626  Do not attempt resuscitation (DNR)  Continuous    Question Answer Comment  In the event of cardiac or respiratory ARREST Do not call a "code blue"   In the event of cardiac or respiratory ARREST Do not perform Intubation, CPR, defibrillation or ACLS   In the event of cardiac or respiratory ARREST  Use medication by any route, position, wound care, and other measures to relive pain and suffering. May use oxygen, suction and manual treatment of airway obstruction as needed for comfort.      04/06/18 1626        Code Status History     Date Active Date Inactive Code Status Order ID Comments User Context   04/06/2018 1550 04/06/2018 1626 DNR [id number]  Ulice Bold, NP Inpatient   03/30/2018 1037 04/06/2018 1550 Full Code [id number]  Alford Highland, MD ED   02/24/2018 1540 02/26/2018 1701 Full Code [id number]  Ramonita Lab, MD Inpatient   08/22/2017 0211 08/30/2017 1953 Full Code [id number]  Cammy Copa, MD Inpatient   07/01/2017 1654 07/03/2017 0404 Full Code [id number]  Enedina Finner, MD Inpatient   05/15/2017 0540 05/21/2017 1406 Full Code [id number]  Ihor Austin, MD Inpatient   05/31/2016 1606 06/11/2016 2013 Full Code [id number]  Kathlene Cote, PA-C ED   03/13/2016 2236 03/16/2016 2033 Full Code [id number]  Oralia Manis, MD ED   02/25/2016 1758 02/27/2016 2332 Full Code [id number]  Shaune Pollack, MD Inpatient   01/19/2016 0454 01/20/2016 2103 Full Code [id number]  Hillary Bow, DO ED   12/25/2015 2001 01/04/2016 2024 Full Code [id number]  Houston Siren, MD Inpatient   11/19/2015 0117 11/21/2015 1956 Full Code [id number]  [place], [place], DO Inpatient   10/06/2015 0208 10/06/2015 1832 Full Code [id number]  Ihor Austin, MD Inpatient   09/13/2015 0214 09/15/2015 1728 Full Code [id number]  Arnaldo Natal, MD Inpatient   08/24/2015 0350 08/31/2015 1847 Full Code [id number]  Ihor Austin, MD Inpatient   07/16/2015 0626 08/02/2015 1848 Full Code [id number]  Ihor Austin, MD ED   07/10/2015 0149 07/12/2015 1301 Full Code [id number]  Oralia Manis, MD Inpatient   05/12/2015 1546 05/16/2015 1735 Full Code [id number]  Auburn Bilberry, MD ED      TOTAL TIME TAKING CARE OF THIS PATIENT: 38 minutes.    Note: This dictation was prepared with Dragon dictation along with smaller phrase technology. Any transcriptional errors that result from this process are unintentional.  Ahlam Piscitelli M.D on 04/07/2018 at 12:46 PM  Between 7am to 6pm - Pager - [phone] After 6pm go to [email or web address] - Social research officer, government  [facility]  Office   [phone]  CC: Primary care physician; Dorothey Baseman, MD

## 2018-04-08 LAB — LEVETIRACETAM LEVEL: Levetiracetam Lvl: 33.4 ug/mL (ref 10.0–40.0)

## 2018-05-02 DEATH — deceased

## 2018-06-14 IMAGING — CR DG CHEST 2V
1 series · 2 of 2 positions shown · non-contrast
Comparison: 11/18/2015

CLINICAL DATA: Left side chest pain, shortness of breath.

EXAM:
CHEST  2 VIEW

[Series 1: dg chest 2 view · 0.14mm/px · 2 of 2 slices shown]
[im 1/2]
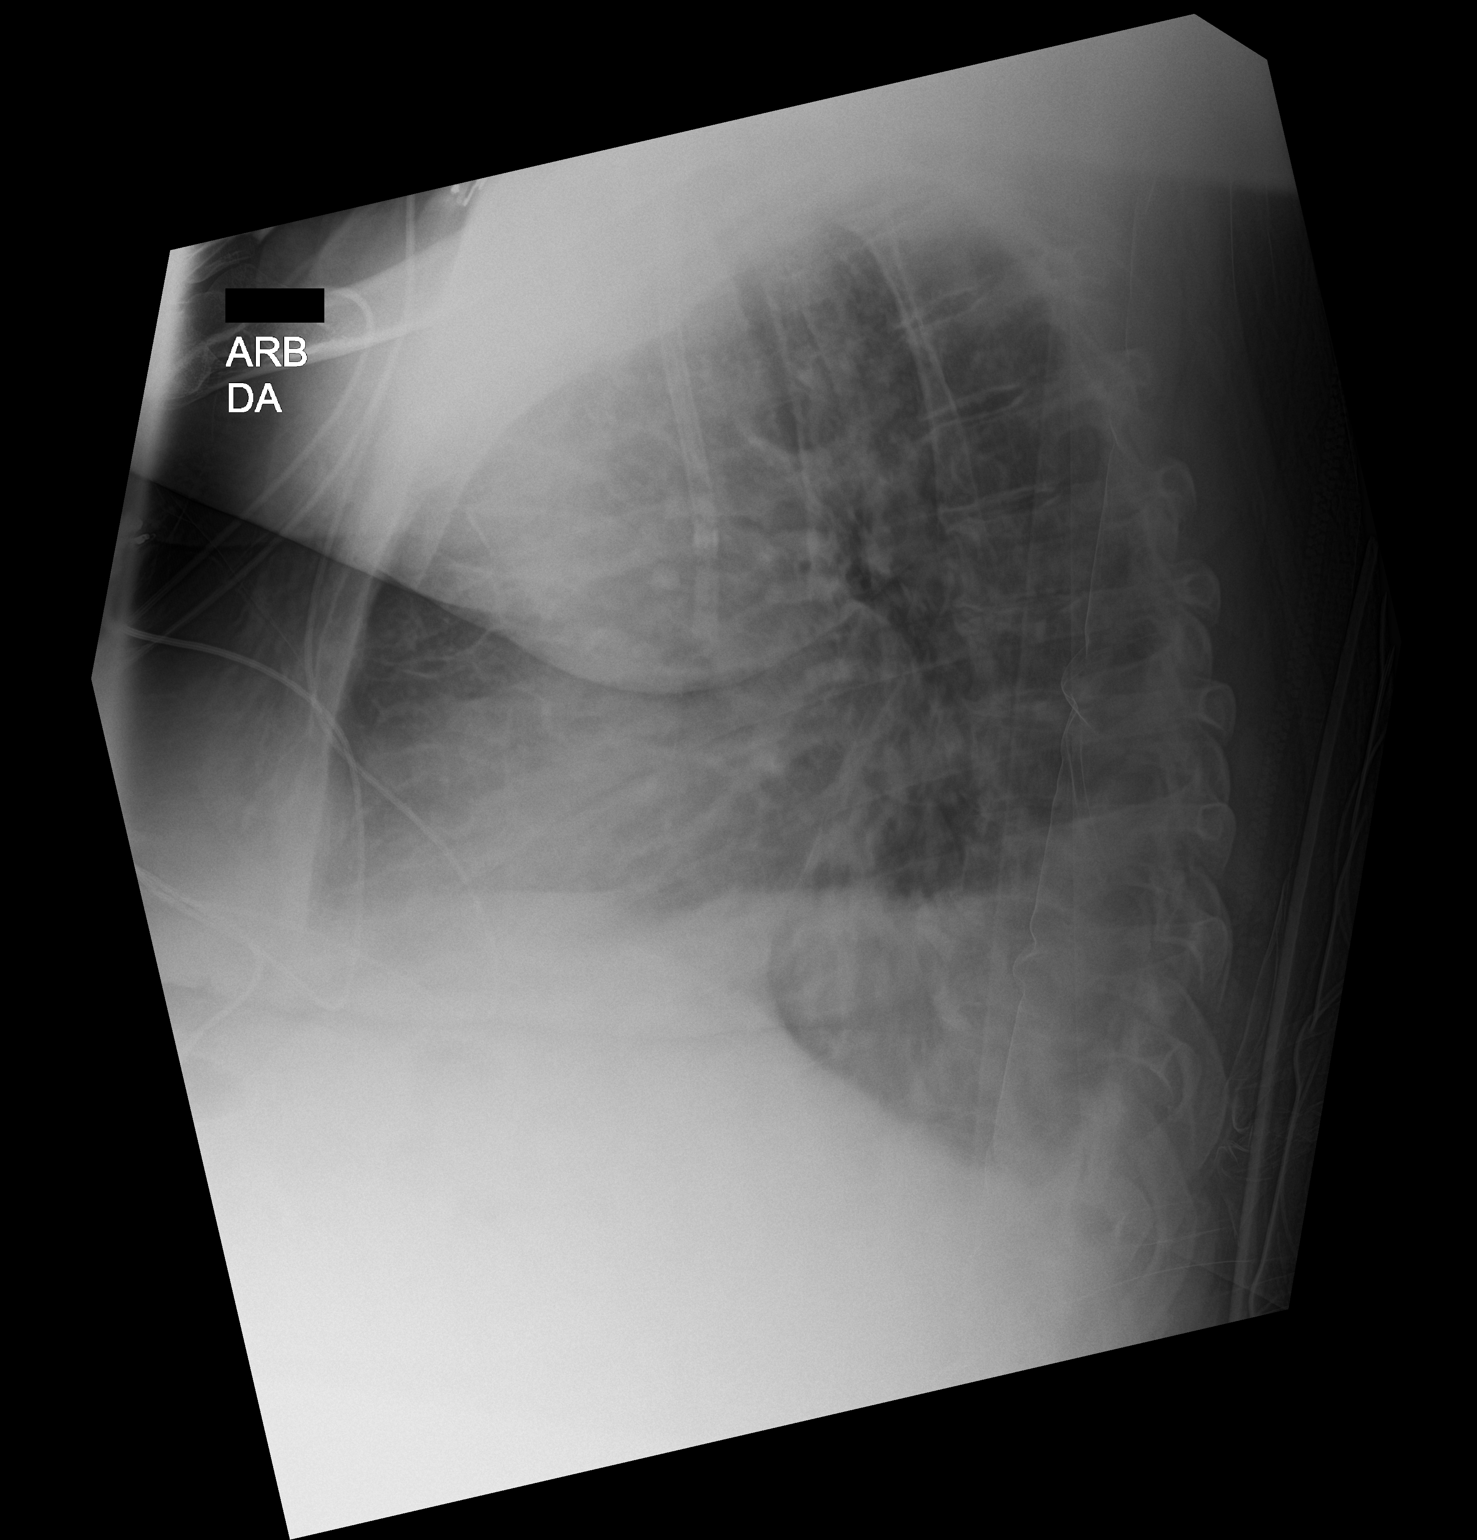
[im 2/2]
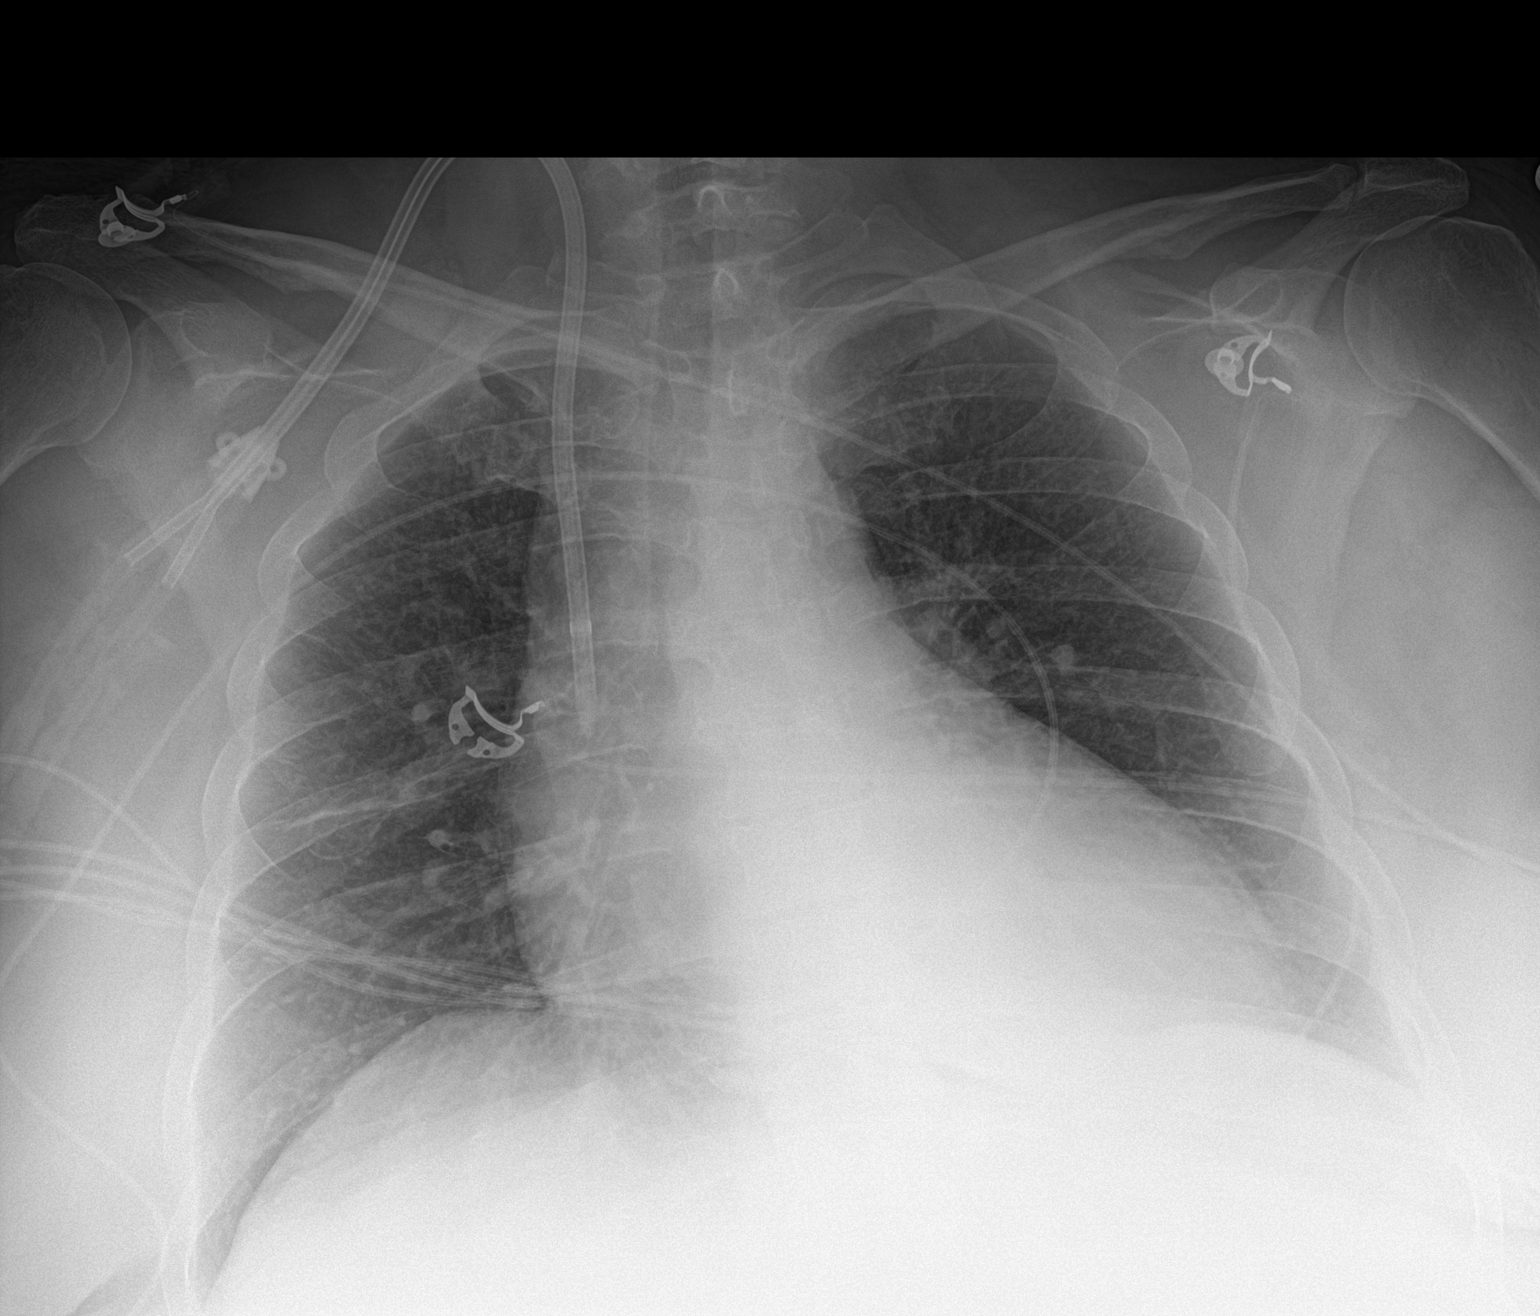

[2 of 2 positions shown; findings below may reference images not displayed]

FINDINGS: Right dialysis catheter remains in place, unchanged. Mild
cardiomegaly. No confluent opacities. Small left pleural effusion.
No edema. No acute bony abnormality.
IMPRESSION: Mild cardiomegaly.

Small left effusion.

## 2018-07-16 ENCOUNTER — Encounter (INDEPENDENT_AMBULATORY_CARE_PROVIDER_SITE_OTHER): Payer: Medicare HMO

## 2018-07-16 ENCOUNTER — Ambulatory Visit (INDEPENDENT_AMBULATORY_CARE_PROVIDER_SITE_OTHER): Payer: Medicare HMO | Admitting: Nurse Practitioner

## 2018-09-08 ENCOUNTER — Ambulatory Visit: Payer: Medicare HMO | Admitting: Family Medicine

## 2018-09-08 ENCOUNTER — Ambulatory Visit: Payer: Medicare HMO | Admitting: Nurse Practitioner

## 2020-09-18 IMAGING — DX DG CHEST 1V PORT
1 series · 1 of 1 positions shown · non-contrast
Comparison: Chest x-rays dated 08/28/2017 and 08/27/2017

CLINICAL DATA: Altered mental status.

EXAM:
PORTABLE CHEST 1 VIEW

[chest ap]
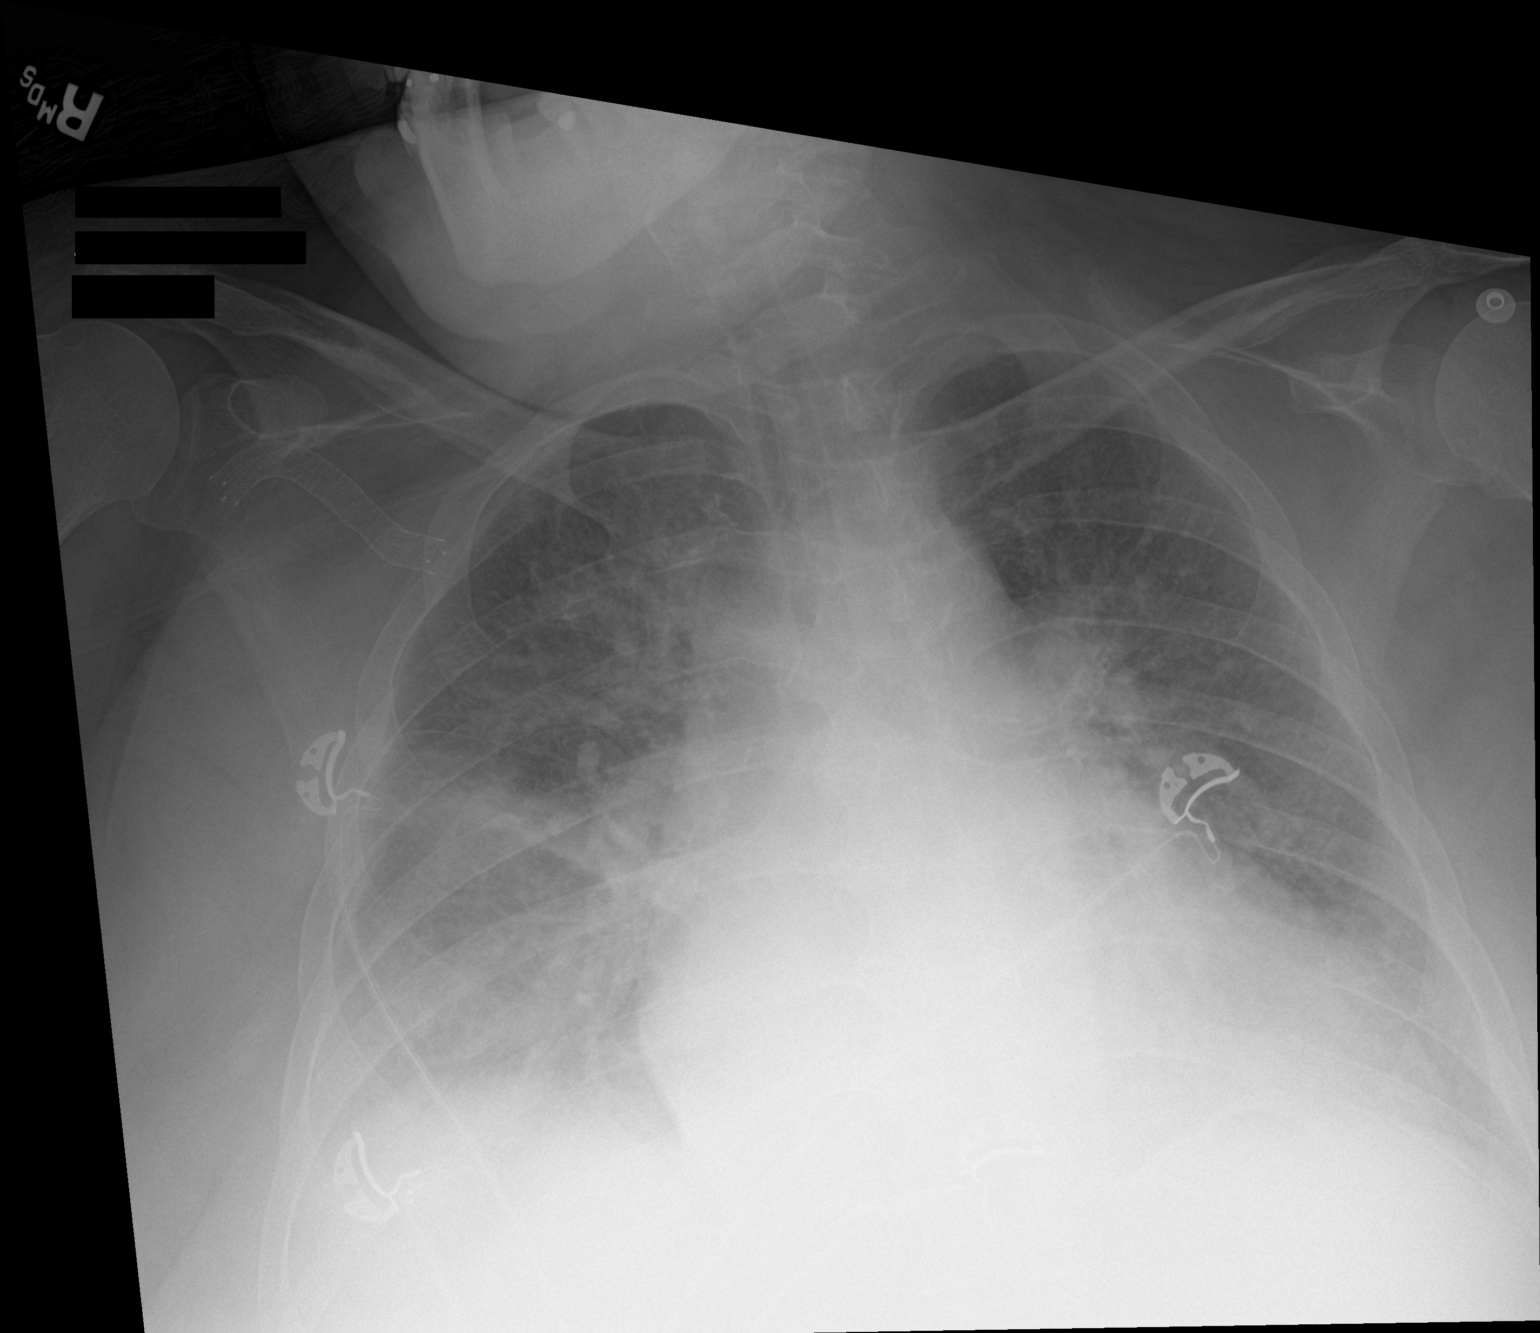

[1 of 1 positions shown; findings below may reference images not displayed]

FINDINGS: Cardiomegaly with bilateral perihilar hazy infiltrates consistent
with pulmonary edema. There is suggestion of a small right pleural
effusion.

Pulmonary vascularity is at the upper limits of normal.

No acute bone abnormality.
IMPRESSION: Findings suggestive of mild pulmonary edema with a small right
effusion.
